# Patient Record
Sex: Female | Born: 1965
Health system: Southern US, Community
[De-identification: ages and names within clinical notes are randomized; demographics above are authoritative.]

## PROBLEM LIST (undated history)

## (undated) DIAGNOSIS — H547 Unspecified visual loss: Secondary | ICD-10-CM

## (undated) DIAGNOSIS — I639 Cerebral infarction, unspecified: Secondary | ICD-10-CM

## (undated) DIAGNOSIS — D171 Benign lipomatous neoplasm of skin and subcutaneous tissue of trunk: Secondary | ICD-10-CM

## (undated) DIAGNOSIS — G43009 Migraine without aura, not intractable, without status migrainosus: Secondary | ICD-10-CM

## (undated) DIAGNOSIS — R011 Cardiac murmur, unspecified: Secondary | ICD-10-CM

## (undated) DIAGNOSIS — G609 Hereditary and idiopathic neuropathy, unspecified: Secondary | ICD-10-CM

## (undated) DIAGNOSIS — L089 Local infection of the skin and subcutaneous tissue, unspecified: Secondary | ICD-10-CM

## (undated) DIAGNOSIS — L03115 Cellulitis of right lower limb: Secondary | ICD-10-CM

## (undated) DIAGNOSIS — G2581 Restless legs syndrome: Secondary | ICD-10-CM

## (undated) DIAGNOSIS — I2510 Atherosclerotic heart disease of native coronary artery without angina pectoris: Secondary | ICD-10-CM

## (undated) DIAGNOSIS — E039 Hypothyroidism, unspecified: Secondary | ICD-10-CM

## (undated) DIAGNOSIS — E11628 Type 2 diabetes mellitus with other skin complications: Secondary | ICD-10-CM

## (undated) DIAGNOSIS — Z87442 Personal history of urinary calculi: Secondary | ICD-10-CM

## (undated) DIAGNOSIS — L02611 Cutaneous abscess of right foot: Secondary | ICD-10-CM

## (undated) DIAGNOSIS — I10 Essential (primary) hypertension: Secondary | ICD-10-CM

## (undated) DIAGNOSIS — K219 Gastro-esophageal reflux disease without esophagitis: Secondary | ICD-10-CM

## (undated) DIAGNOSIS — F329 Major depressive disorder, single episode, unspecified: Secondary | ICD-10-CM

## (undated) DIAGNOSIS — I739 Peripheral vascular disease, unspecified: Secondary | ICD-10-CM

## (undated) DIAGNOSIS — E1165 Type 2 diabetes mellitus with hyperglycemia: Secondary | ICD-10-CM

## (undated) DIAGNOSIS — I96 Gangrene, not elsewhere classified: Secondary | ICD-10-CM

## (undated) DIAGNOSIS — F419 Anxiety disorder, unspecified: Secondary | ICD-10-CM

## (undated) DIAGNOSIS — S98111A Complete traumatic amputation of right great toe, initial encounter: Secondary | ICD-10-CM

## (undated) DIAGNOSIS — E663 Overweight: Secondary | ICD-10-CM

## (undated) DIAGNOSIS — R209 Unspecified disturbances of skin sensation: Secondary | ICD-10-CM

## (undated) DIAGNOSIS — E785 Hyperlipidemia, unspecified: Secondary | ICD-10-CM

## (undated) DIAGNOSIS — M86172 Other acute osteomyelitis, left ankle and foot: Secondary | ICD-10-CM

## (undated) DIAGNOSIS — I5033 Acute on chronic diastolic (congestive) heart failure: Secondary | ICD-10-CM

## (undated) DIAGNOSIS — H269 Unspecified cataract: Secondary | ICD-10-CM

## (undated) HISTORY — DX: Unspecified disturbances of skin sensation: R20.9

## (undated) HISTORY — DX: Cardiac murmur, unspecified: R01.1

## (undated) HISTORY — DX: Cerebral infarction, unspecified: I63.9

## (undated) HISTORY — DX: Hereditary and idiopathic neuropathy, unspecified: G60.9

## (undated) HISTORY — DX: Benign lipomatous neoplasm of skin and subcutaneous tissue of trunk: D17.1

## (undated) HISTORY — DX: Cutaneous abscess of right foot: L02.611

## (undated) HISTORY — DX: Restless legs syndrome: G25.81

## (undated) HISTORY — DX: Overweight: E66.3

## (undated) HISTORY — DX: Local infection of the skin and subcutaneous tissue, unspecified: L08.9

## (undated) HISTORY — DX: Essential (primary) hypertension: I10

## (undated) HISTORY — DX: Peripheral vascular disease, unspecified: I73.9

## (undated) HISTORY — DX: Type 2 diabetes mellitus with hyperglycemia: E11.65

## (undated) HISTORY — DX: Hyperlipidemia, unspecified: E78.5

## (undated) HISTORY — DX: Type 2 diabetes mellitus with other skin complications: E11.628

## (undated) HISTORY — DX: Major depressive disorder, single episode, unspecified: F32.9

## (undated) HISTORY — PX: WISDOM TOOTH EXTRACTION: SHX21

## (undated) HISTORY — DX: Migraine without aura, not intractable, without status migrainosus: G43.009

## (undated) HISTORY — DX: Unspecified visual loss: H54.7

---
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
## 1988-09-08 LAB — CONVERTED CEMR LAB

## 2002-11-05 ENCOUNTER — Encounter: Payer: Self-pay | Admitting: Emergency Medicine

## 2002-11-05 ENCOUNTER — Emergency Department (HOSPITAL_COMMUNITY): Admission: EM | Admit: 2002-11-05 | Discharge: 2002-11-05 | Payer: Self-pay | Admitting: Emergency Medicine

## 2010-10-07 ENCOUNTER — Ambulatory Visit
Admission: RE | Admit: 2010-10-07 | Discharge: 2010-10-07 | Payer: Self-pay | Source: Home / Self Care | Attending: Family Medicine | Admitting: Family Medicine

## 2010-10-07 DIAGNOSIS — G609 Hereditary and idiopathic neuropathy, unspecified: Secondary | ICD-10-CM

## 2010-10-07 DIAGNOSIS — I10 Essential (primary) hypertension: Secondary | ICD-10-CM | POA: Insufficient documentation

## 2010-10-07 DIAGNOSIS — E1165 Type 2 diabetes mellitus with hyperglycemia: Secondary | ICD-10-CM

## 2010-10-07 DIAGNOSIS — IMO0002 Reserved for concepts with insufficient information to code with codable children: Secondary | ICD-10-CM

## 2010-10-07 DIAGNOSIS — R209 Unspecified disturbances of skin sensation: Secondary | ICD-10-CM

## 2010-10-07 DIAGNOSIS — G43009 Migraine without aura, not intractable, without status migrainosus: Secondary | ICD-10-CM

## 2010-10-07 DIAGNOSIS — E119 Type 2 diabetes mellitus without complications: Secondary | ICD-10-CM | POA: Insufficient documentation

## 2010-10-07 DIAGNOSIS — Z8679 Personal history of other diseases of the circulatory system: Secondary | ICD-10-CM | POA: Insufficient documentation

## 2010-10-07 DIAGNOSIS — Z87442 Personal history of urinary calculi: Secondary | ICD-10-CM | POA: Insufficient documentation

## 2010-10-07 HISTORY — DX: Migraine without aura, not intractable, without status migrainosus: G43.009

## 2010-10-07 HISTORY — DX: Type 2 diabetes mellitus with hyperglycemia: E11.65

## 2010-10-07 HISTORY — DX: Reserved for concepts with insufficient information to code with codable children: IMO0002

## 2010-10-07 HISTORY — DX: Unspecified disturbances of skin sensation: R20.9

## 2010-10-07 HISTORY — DX: Hereditary and idiopathic neuropathy, unspecified: G60.9

## 2010-10-16 ENCOUNTER — Encounter: Payer: Self-pay | Admitting: *Deleted

## 2010-10-16 NOTE — Assessment & Plan Note (Signed)
Summary: New pt est care/dt [employer] rsch per bmp list/dt   Vital Signs:  Patient profile:   45 year old female Menstrual status:  irregular Height:      62 inches (157.48 cm) Weight:      150 pounds (68.18 kg) BMI:     27.53 O2 Sat:      99 % on Room air Temp:     97.8 degrees F (36.56 degrees C) oral Pulse rate:   83 / minute BP sitting:   167 / 98  (right arm) Cuff size:   regular  Vitals Entered By: Josph Macho RMA (October 07, 2010 8:52 AM)  O2 Flow:  Room air CC: Establish new patient/ CF     Menstrual Status irregular Last PAP Result historical   History of Present Illness: Patient is a 45yo Caucasian female who was seen after working a shift all night long. She is here to establish care. he reports working the night shift for quite some time in roughly getting 4-5 hours sleep anything for her. Usually broken up. As a result she struggles with chronic fatigue. She has a history of migraines although those are actually less frequent at the moment and he has been in the past. She has been told in the past she has a cardiac murmur. As well as empty sellar syndrome diagnosed by CAT scan many years ago. At present her complaints or neurological she is complaining of a sensation of numbness and decreased sensation over her right ear and entire right side of her scalp. As well as slightly on the neck and shoulder she says it's been going on for several months now, maybe as long as 6. complaining of tingling in almost a burning sensation that's constant but does very in intensity in bilateral feet. She reports encompasses that but if he completely and extends up to just below the mid tibial plateau bilaterally. She reports having trouble with diabetes in the past but always diet-controlled and never had any trouble with severe high sugars. She denies polyuria, polydipsia, fevers, chills, chest pain palpitations, shortness of breath, GI or GU complaints at this time.  Preventive  Screening-Counseling & Management  Alcohol-Tobacco     Smoking Status: quit  Caffeine-Diet-Exercise     Does Patient Exercise: yes      Drug Use:  no.    Current Problems (verified): 1)  Disturbance of Skin Sensation  (ICD-782.0) 2)  Common Migraine  (ICD-346.10) 3)  Nephrolithiasis, Hx of  (ICD-V13.01) 4)  Heart Murmur, Hx of  (ICD-V12.50) 5)  Peripheral Neuropathy, Feet  (ICD-356.9) 6)  Essential Hypertension, Benign  (ICD-401.1) 7)  Dm  (ICD-250.00)  Current Medications (verified): 1)  None  Allergies (verified): No Known Drug Allergies  Past History:  Family History: Last updated: 10/07/2010 Father: 51, A&W Mother: 19, arthritis, osteo Siblings:  Brother: 9, stroke, previous smoker Brother: 52, Leukemia, waiting liver transplant, alcoholism, in remission MGM: deceased older, unknown causes MGF: deceased older, unknown causes PGM: deceased older, unknown causes PGF: deceased older, unknown causes Children: Son: 19, A&W  Social History: Last updated: 10/07/2010 Occupation: Location manager, night shift Former Games developer, 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 Occupation:  employed Smoking Status:  quit Drug Use:  no Does Patient Exercise:  yes  Review of Systems  The patient denies anorexia, fever, weight loss, weight gain, vision loss, decreased hearing, hoarseness, chest pain, syncope, dyspnea on exertion, peripheral edema, prolonged cough, headaches, hemoptysis, abdominal pain, melena, hematochezia, severe indigestion/heartburn, hematuria, incontinence, muscle weakness, suspicious skin lesions, transient blindness, difficulty walking, depression, unusual weight change, abnormal bleeding, and enlarged lymph nodes.    Physical Exam  General:  Well-developed,well-nourished,in no acute distress; alert,appropriate and cooperative throughout examination Head:  Normocephalic and atraumatic without obvious abnormalities. No apparent alopecia or balding. Eyes:  No corneal or conjunctival inflammation noted. EOMI. Perrla.  Ears:  External ear exam shows no significant lesions or deformities.  Otoscopic examination reveals clear canals, tympanic membranes are intact bilaterally without bulging, retraction, inflammation or discharge. Hearing is grossly normal bilaterally. Nose:  External nasal examination shows no deformity or inflammation. Nasal mucosa are pink and moist without lesions or exudates. Mouth:  Oral mucosa and oropharynx without lesions or exudates.  Teeth in good repair. Neck:  No deformities, masses, or tenderness noted. Lungs:  Normal respiratory effort, chest expands symmetrically. Lungs are clear to auscultation, no crackles or wheezes. Heart:  Normal rate and regular rhythm. S1 and S2 normal without gallop, murmur, click, rub or other extra sounds. Abdomen:  Bowel sounds positive,abdomen soft and non-tender without masses, organomegaly or hernias  noted. Msk:  No deformity or scoliosis noted of thoracic or lumbar spine.   Pulses:  R and L carotid,dorsalis pedis and posterior tibial pulses are full and equal bilaterally Extremities:  No clubbing, cyanosis, edema, or deformity noted with normal full range of motion of all joints.   Neurologic:   Station and gait are normal. Plantar reflexes are down-going bilaterally. DTRs are symmetrical throughout. mildly decreased light touch throughout right face compared to left face. Patient reports decreased sensation along lower ribs on right as well. Then decreased sensation from midtibial plateau down to toes b/l Skin:  Intact without suspicious lesions or rashes Cervical Nodes:  No lymphadenopathy noted Psych:  Cognition and judgment appear intact. Alert and cooperative with normal attention span and concentration. No apparent delusions, illusions, hallucinations   Impression & Recommendations:  Problem # 1:  DISTURBANCE OF SKIN SENSATION (ICD-782.0) Unclear etiology will check basic labs including Vitamin B studies, electrolytes, TSH, ESR to further evaluate and patient asked to start a vitamin B complex daily, may need neurologic referral if all studies neg  Problem # 2:  ESSENTIAL HYPERTENSION, BENIGN (ICD-401.1)  Her updated medication list for this problem includes:    Metoprolol Succinate 25 Mg Xr24h-tab (Metoprolol succinate) .Marland Kitchen... 1 tab by mouth daily BP trending up in recent past will initiate meds, patient asked to minimize sodium and caffeine and we will reassess at next visit. Patient only getting max of 5 hours sleep daily, usually broken up because she works nights. Encouraged to try to consolidate her sleep whenever possible  Problem #  3:  COMMON MIGRAINE (ICD-346.10)  Her updated medication list for this problem includes:    Metoprolol Succinate 25 Mg Xr24h-tab (Metoprolol succinate) .Marland Kitchen... 1 tab by mouth daily Very infrequent now, patient will report worsening  symptoms  Complete Medication List: 1)  Metoprolol Succinate 25 Mg Xr24h-tab (Metoprolol succinate) .Marland Kitchen.. 1 tab by mouth daily  Patient Instructions: 1)  Please schedule a follow-up appointment in 2 to 4 weeks. Labs here when we open lab 2)  BMP prior to visit, ICD-9:  3)  Hepatic Panel prior to visit ICD-9:  4)  Lipid panel prior to visit ICD-9 :  5)  TSH prior to visit ICD-9 :  6)  CBC w/ Diff prior to visit ICD-9 :  7)  HgBA1c prior to visit  ICD-9:  8)  Urine Microalbumin prior to visit ICD-9 :  9)  ferritin 10)  Vitamin B12 level 11)  Thiamine and Folic Acid levels 12)  Release of Records [facility] 13)  Start a Vitamin B complex qd Prescriptions: METOPROLOL SUCCINATE 25 MG XR24H-TAB (METOPROLOL SUCCINATE) 1 tab by mouth daily  #30 x 2   Entered and Authorized by:   Danise Edge MD   Signed by:   Danise Edge MD on 10/07/2010   Method used:   Electronically to        [facility]  [address] [phone][facility] (retail)       [address] [place], [place]  [address]       Ph: [phone] or [phone]       Fax: [phone]   RxID:   [id number]    Orders Added: 1)  New Patient Level III [29518]   Immunization History:  Influenza Immunization History:    Influenza:  historical (05/09/2010)   Immunization History:  Influenza Immunization History:    Influenza:  Historical (05/09/2010)  Preventive Care Screening  Last Tetanus Booster:    Date:  09/08/2008    Results:  Historical   Pap Smear:    Date:  09/08/1988    Results:  historical

## 2010-10-25 ENCOUNTER — Encounter: Payer: Self-pay | Admitting: Family Medicine

## 2010-10-25 ENCOUNTER — Ambulatory Visit (INDEPENDENT_AMBULATORY_CARE_PROVIDER_SITE_OTHER): Payer: Self-pay | Admitting: Family Medicine

## 2010-10-25 DIAGNOSIS — G2581 Restless legs syndrome: Secondary | ICD-10-CM

## 2010-10-25 DIAGNOSIS — G609 Hereditary and idiopathic neuropathy, unspecified: Secondary | ICD-10-CM

## 2010-10-25 DIAGNOSIS — I10 Essential (primary) hypertension: Secondary | ICD-10-CM

## 2010-10-25 DIAGNOSIS — R209 Unspecified disturbances of skin sensation: Secondary | ICD-10-CM

## 2010-10-25 HISTORY — DX: Restless legs syndrome: G25.81

## 2010-10-30 ENCOUNTER — Encounter: Payer: Self-pay | Admitting: *Deleted

## 2010-10-31 ENCOUNTER — Other Ambulatory Visit: Payer: BC Managed Care – PPO

## 2010-10-31 ENCOUNTER — Other Ambulatory Visit: Payer: Self-pay | Admitting: Family Medicine

## 2010-10-31 ENCOUNTER — Encounter: Payer: Self-pay | Admitting: Family Medicine

## 2010-10-31 LAB — CBC WITH DIFFERENTIAL/PLATELET
Basophils Absolute: 0 10*3/uL (ref 0.0–0.1)
Basophils Relative: 0.6 % (ref 0.0–3.0)
Eosinophils Absolute: 0.1 10*3/uL (ref 0.0–0.7)
Eosinophils Relative: 1.5 % (ref 0.0–5.0)
HCT: 38.4 % (ref 36.0–46.0)
Hemoglobin: 13.2 g/dL (ref 12.0–15.0)
Lymphocytes Relative: 25.3 % (ref 12.0–46.0)
Lymphs Abs: 1.7 10*3/uL (ref 0.7–4.0)
MCHC: 34.3 g/dL (ref 30.0–36.0)
MCV: 86.7 fl (ref 78.0–100.0)
Monocytes Absolute: 0.5 10*3/uL (ref 0.1–1.0)
Monocytes Relative: 7.7 % (ref 3.0–12.0)
Neutro Abs: 4.5 10*3/uL (ref 1.4–7.7)
Neutrophils Relative %: 64.9 % (ref 43.0–77.0)
Platelets: 303 10*3/uL (ref 150.0–400.0)
RBC: 4.43 Mil/uL (ref 3.87–5.11)
RDW: 13.4 % (ref 11.5–14.6)
WBC: 6.9 10*3/uL (ref 4.5–10.5)

## 2010-10-31 LAB — B12 AND FOLATE PANEL
Folate: 16 ng/mL (ref >5.9)
Vitamin B-12: 379 pg/mL (ref 211–911)

## 2010-10-31 LAB — LIPID PANEL
Cholesterol: 194 mg/dL (ref 0–200)
HDL: 46.8 mg/dL (ref >39.00)
LDL Cholesterol: 121 mg/dL — ABNORMAL HIGH (ref 0–99)
Total CHOL/HDL Ratio: 4
Triglycerides: 132 mg/dL (ref 0.0–149.0)
VLDL: 26.4 mg/dL (ref 0.0–40.0)

## 2010-10-31 LAB — HEPATIC FUNCTION PANEL
ALT: 20 U/L (ref 0–35)
AST: 18 U/L (ref 0–37)
Albumin: 3.5 g/dL (ref 3.5–5.2)
Alkaline Phosphatase: 97 U/L (ref 39–117)
Bilirubin, Direct: 0.1 mg/dL (ref 0.0–0.3)
Total Bilirubin: 0.2 mg/dL — ABNORMAL LOW (ref 0.3–1.2)
Total Protein: 6.7 g/dL (ref 6.0–8.3)

## 2010-10-31 LAB — SEDIMENTATION RATE: Sed Rate: 19 mm/hr (ref 0–22)

## 2010-10-31 LAB — TSH: TSH: 2.13 u[IU]/mL (ref 0.35–5.50)

## 2010-10-31 LAB — HIGH SENSITIVITY CRP: CRP, High Sensitivity: 5.23 mg/L — ABNORMAL HIGH (ref 0.00–5.00)

## 2010-10-31 LAB — MAGNESIUM: Magnesium: 1.7 mg/dL (ref 1.5–2.5)

## 2010-10-31 LAB — FERRITIN: Ferritin: 17.8 ng/mL (ref 10.0–291.0)

## 2010-11-05 NOTE — Assessment & Plan Note (Signed)
Summary: 2 week follow up appt   Vital Signs:  Patient profile:   45 year old female Menstrual status:  irregular Height:      62 inches (157.48 cm) Weight:      151.25 pounds (68.75 kg) O2 Sat:      99 % on Room air Temp:     98.2 degrees F (36.78 degrees C) oral Pulse rate:   71 / minute BP sitting:   143 / 91  (right arm) Cuff size:   regular  Vitals Entered By: Josph Macho RMA (October 25, 2010 8:30 AM)  O2 Flow:  Room air  Serial Vital Signs/Assessments:  Time      Position  BP       Pulse  Resp  Temp     By                     140/88                         Danise Edge MD  CC: 2 week follow up/ CF Is Patient Diabetic? No   History of Present Illness: Patient is a 45 yo Caucasian femalre who is in today for follow up on her new patient appt. She notes  the discomfort and numbness in her feet is really bothering her, at night they actually hurt and tingle and she has a hard time getting comfortable because of it. She will use Aleve, Advil or Tylenol at times and get some relief from any of them. No fevers, chills, CP, palp, SOB, GI or GU c/o. Still having trouble sleeping both going to sleep and staying asleep.  Current Medications (verified): 1)  Metoprolol Succinate 25 Mg Xr24h-Tab (Metoprolol Succinate) .Marland Kitchen.. 1 Tab By Mouth Daily  Allergies (verified): No Known Drug Allergies  Past History:  Past medical history reviewed for relevance to current acute and chronic problems. Social history (including risk factors) reviewed for relevance to current acute and chronic problems.  Social History: Reviewed history from 10/07/2010 and no changes required. 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  Review of Systems      See  HPI  Physical Exam  General:  Well-developed,well-nourished,in no acute distress; alert,appropriate and cooperative throughout examination Head:  Normocephalic and atraumatic without obvious abnormalities. No apparent alopecia or balding. Mouth:  Oral mucosa and oropharynx without lesions or exudates.  Teeth in good repair. Neck:  No deformities, masses, or tenderness noted. Lungs:  Normal respiratory effort, chest expands symmetrically. Lungs are clear to auscultation, no crackles or wheezes. Heart:  Normal rate and regular rhythm. S1 and S2 normal without gallop, murmur, click, rub or other extra sounds. Abdomen:  Bowel sounds positive,abdomen soft and non-tender without masses, organomegaly or hernias noted. Extremities:  No clubbing, cyanosis, edema, or deformity noted with normal full range of motion of all joints.   Cervical Nodes:  No lymphadenopathy noted Psych:  Cognition and judgment appear intact. Alert and cooperative with normal attention span and concentration. No apparent delusions, illusions, hallucinations   Impression & Recommendations:  Problem # 1:  DISTURBANCE OF SKIN SENSATION (ICD-782.0)  Orders: TLB-TSH (Thyroid Stimulating Hormone) (84443-TSH) TLB-CRP-High Sensitivity (C-Reactive Protein) (86140-FCRP) TLB-Ferritin (82728-FER) TLB-Magnesium (Mg) (83735-MG) TLB-Sedimentation Rate (ESR) (85652-ESR) T-Rocky Mtn Spotted  Fever AB IgG IgM (66440-34742) Strange disstribution of numbness and paresthesias, along right side of head, ear, CW and b/l feet. Symptoms have been present and stable for about a year now. She does recal some tick bites with rash but only a few months ago, will check labs and refer to Neuro for further evaluation  Problem # 2:  ESSENTIAL HYPERTENSION, BENIGN (ICD-401.1)  Her updated medication list for this problem includes:    Metoprolol Succinate 25 Mg Xr24h-tab (Metoprolol succinate) .Marland Kitchen... 1 tab by mouth daily  Orders: TLB-Lipid Panel  (80061-LIPID) TLB-CBC Platelet - w/Differential (85025-CBCD) TLB-Hepatic/Liver Function Pnl (80076-HEPATIC) TLB-TSH (Thyroid Stimulating Hormone) (84443-TSH) TLB-CRP-High Sensitivity (C-Reactive Protein) (86140-FCRP) TLB-Ferritin (82728-FER) TLB-Magnesium (Mg) (83735-MG) TLB-Sedimentation Rate (ESR) (85652-ESR) Avoid sodium and recheck at next visit  Problem # 3:  RESTLESS LEG SYNDROME (ICD-333.94) Started on Requip and will reevluate at next visit  Complete Medication List: 1)  Metoprolol Succinate 25 Mg Xr24h-tab (Metoprolol succinate) .Marland Kitchen.. 1 tab by mouth daily 2)  Requip 0.5 Mg Tabs (Ropinirole hcl) .Marland Kitchen.. 1-2 tabs by mouth once at bedtime  Other Orders: TLB-B12 + Folate Pnl (82746_82607-B12/FOL) T-Lyme Disease (59563-87564)  Patient Instructions: 1)  Please schedule a follow-up appointment in 1 to 2 months 2)  Minimize sodium, take med prior to visit 3)  take requip one tab at bedtime x 1 week and then increase to 2 tabs at bedtime as needed as tolerated Prescriptions: REQUIP 0.5 MG TABS (ROPINIROLE HCL) 1-2 tabs by mouth once at bedtime  #60 x 1   Entered and Authorized by:   Danise Edge MD   Signed by:   Danise Edge MD on 10/25/2010   Method used:   [facility] to        [facility]  [address] [phone]* (retail)       [address] [place], [place]  [address]       Ph: [phone] or [phone]       Fax: [phone]   RxID:   [id number]    Orders Added: 1)  TLB-B12 + Folate Pnl [82746_82607-B12/FOL] 2)  TLB-Lipid Panel [80061-LIPID] 3)  TLB-CBC Platelet - w/Differential [85025-CBCD] 4)  TLB-Hepatic/Liver Function Pnl [80076-HEPATIC] 5)  TLB-TSH (Thyroid Stimulating Hormone) [84443-TSH] 6)  TLB-CRP-High Sensitivity (C-Reactive Protein) [86140-FCRP] 7)  TLB-Ferritin [82728-FER] 8)  TLB-Magnesium (Mg) [83735-MG] 9)  TLB-Sedimentation Rate (ESR) [85652-ESR] 10)  T-Lyme Disease [37106-26948] 11)  T-Rocky Mtn Spotted Fever AB IgG IgM  [54627-03500] 12)  Est. Patient Level IV [93818]

## 2010-11-18 ENCOUNTER — Encounter: Payer: Self-pay | Admitting: *Deleted

## 2010-12-06 ENCOUNTER — Ambulatory Visit (INDEPENDENT_AMBULATORY_CARE_PROVIDER_SITE_OTHER): Payer: BC Managed Care – PPO | Admitting: Family Medicine

## 2010-12-06 ENCOUNTER — Encounter: Payer: Self-pay | Admitting: Family Medicine

## 2010-12-06 DIAGNOSIS — I10 Essential (primary) hypertension: Secondary | ICD-10-CM

## 2010-12-06 DIAGNOSIS — E663 Overweight: Secondary | ICD-10-CM

## 2010-12-06 DIAGNOSIS — G609 Hereditary and idiopathic neuropathy, unspecified: Secondary | ICD-10-CM

## 2010-12-06 DIAGNOSIS — G2581 Restless legs syndrome: Secondary | ICD-10-CM

## 2010-12-06 DIAGNOSIS — E119 Type 2 diabetes mellitus without complications: Secondary | ICD-10-CM

## 2010-12-06 DIAGNOSIS — E785 Hyperlipidemia, unspecified: Secondary | ICD-10-CM

## 2010-12-06 DIAGNOSIS — R202 Paresthesia of skin: Secondary | ICD-10-CM

## 2010-12-06 DIAGNOSIS — R209 Unspecified disturbances of skin sensation: Secondary | ICD-10-CM

## 2010-12-06 DIAGNOSIS — Z87442 Personal history of urinary calculi: Secondary | ICD-10-CM

## 2010-12-06 HISTORY — DX: Hyperlipidemia, unspecified: E78.5

## 2010-12-06 HISTORY — DX: Overweight: E66.3

## 2010-12-06 MED ORDER — DOXYCYCLINE HYCLATE 100 MG PO TABS: 100.0000 mg | ORAL_TABLET | Freq: Two times a day (BID) | ORAL | Status: AC

## 2010-12-06 MED ORDER — CHLORTHALIDONE 25 MG PO TABS: 25.0000 mg | ORAL_TABLET | Freq: Every day | ORAL | Status: DC

## 2010-12-06 NOTE — Assessment & Plan Note (Signed)
No recent episodes, encouraged ongoing adequate hydration

## 2010-12-06 NOTE — Assessment & Plan Note (Signed)
Numbers remain hi will add Chlorthalidone and recheck BP and renal panel in one month or as needed.

## 2010-12-06 NOTE — Progress Notes (Deleted)
[name] 12/06/2010      Progress Note-Follow Up  Subjective Subjective  Past Medical History  Diagnosis Date  . Hypertension   . Hyperlipidemia 12/06/2010  . Overweight 12/06/2010    History reviewed. No pertinent past surgical history.  Family History  Problem Relation Age of Onset  . Arthritis Mother   . Stroke Brother     previous smoker  . Alcohol abuse Brother     in remission  . Leukemia Brother   . Diabetes Paternal Grandmother     History   Social History  . Marital Status: Married    Spouse Name: N/A    Number of Children: N/A  . Years of Education: N/A   Occupational History  . Not on file.   Social History Main Topics  . Smoking status: Former Smoker -- 0.5 packs/day    Quit date: 09/08/2005  . Smokeless tobacco: Never Used  . Alcohol Use: No     occasional  . Drug Use: No  . Sexually Active: Yes -- Female partner(s)   Other Topics Concern  . Not on file   Social History Narrative  . No narrative on file    Current Outpatient Prescriptions on File Prior to Visit  Medication Sig Dispense Refill  . metoprolol (TOPROL-XL) 25 MG 24 hr tablet Take 25 mg by mouth daily.        Marland Kitchen rOPINIRole (REQUIP) 0.5 MG tablet Take 0.5 mg by mouth at bedtime. 1-2 tabs         No Known Allergies  ROS .ros  Objective Objective  BP 157/94  Pulse 77  Temp(Src) 97.8 F (36.6 C) (Oral)  Ht 5\' 2"  (1.575 m)  Wt 151 lb (68.493 kg)  BMI 27.62 kg/m2  SpO2 98%  LMP 10/23/2010  Lab Results  Component Value Date   TSH 2.13 10/31/2010   Lab Results  Component Value Date   WBC 6.9 10/31/2010   HGB 13.2 10/31/2010   HCT 38.4 10/31/2010   MCV 86.7 10/31/2010   PLT 303.0 10/31/2010   No results found for this basename: CREATININE, BUN, NA, K, CL, CO2   Lab Results  Component Value Date   ALT 20 10/31/2010   AST 18 10/31/2010   ALKPHOS 97 10/31/2010   BILITOT 0.2* 10/31/2010   Lab Results  Component Value Date   CHOL 194 10/31/2010   Lab Results    Component Value Date   HDL 46.80 10/31/2010   Lab Results  Component Value Date   LDLCALC 121* 10/31/2010   Lab Results  Component Value Date   TRIG 132.0 10/31/2010   Lab Results  Component Value Date   CHOLHDL 4 10/31/2010     Assessment & Plan  RESTLESS LEG SYNDROME She has found Requip helpful but uses it infrequently because she wakes up groggy the next morning and she is worried about habituating. She can use a half a tab if she finds that helpful and continuue to use the meds prn.   PERIPHERAL NEUROPATHY, FEET Patient continues to struggling with tingling and burning in her feet and is finding it increasingly uncomfortable. She also continues to have paresthesias on the right side of her neck and head. She will be referred to neurology at this time for further evaluation. She is notified that her labs were largely unremarkable and she was given a copy of them. Her RMSF IGG did come back equivocal and she is notified this is likely a false positive. No changes to therapy  at this time.   ESSENTIAL HYPERTENSION, BENIGN Numbers remain hi will add Chlorthalidone and recheck BP and renal panel in one month or as needed.  DM By patient report previously diet controlled, will need to check hgba1c at next visit. Avoid simple carbs  Overweight Patient is given a handout on the DASH diet and asked to consider trying it. She is encouraged to increase exercise as tolerated.  Hyperlipidemia Mild elevation in LDL cholesterol. No medications at this time but she is encouraged to avoid trans fats and eat a low fat, hi fiber diet

## 2010-12-06 NOTE — Progress Notes (Deleted)
[name] 12/06/2010      Progress Note-Follow Up  Subjective Subjective  Past Medical History  Diagnosis Date  . Hypertension   . Hyperlipidemia 12/06/2010  . Overweight 12/06/2010    History reviewed. No pertinent past surgical history.  Family History  Problem Relation Age of Onset  . Arthritis Mother   . Stroke Brother     previous smoker  . Alcohol abuse Brother     in remission  . Leukemia Brother   . Diabetes Paternal Grandmother     History   Social History  . Marital Status: Married    Spouse Name: N/A    Number of Children: N/A  . Years of Education: N/A   Occupational History  . Not on file.   Social History Main Topics  . Smoking status: Former Smoker -- 0.5 packs/day    Quit date: 09/08/2005  . Smokeless tobacco: Never Used  . Alcohol Use: No     occasional  . Drug Use: No  . Sexually Active: Yes -- Female partner(s)   Other Topics Concern  . Not on file   Social History Narrative  . No narrative on file    Current Outpatient Prescriptions on File Prior to Visit  Medication Sig Dispense Refill  . metoprolol (TOPROL-XL) 25 MG 24 hr tablet Take 25 mg by mouth daily.        Marland Kitchen rOPINIRole (REQUIP) 0.5 MG tablet Take 0.5 mg by mouth at bedtime. 1-2 tabs         No Known Allergies  ROS Objective Objective  BP 157/94  Pulse 77  Temp(Src) 97.8 F (36.6 C) (Oral)  Ht 5\' 2"  (1.575 m)  Wt 151 lb (68.493 kg)  BMI 27.62 kg/m2  SpO2 98%  LMP 10/23/2010  Lab Results  Component Value Date   TSH 2.13 10/31/2010   Lab Results  Component Value Date   WBC 6.9 10/31/2010   HGB 13.2 10/31/2010   HCT 38.4 10/31/2010   MCV 86.7 10/31/2010   PLT 303.0 10/31/2010   No results found for this basename: CREATININE, BUN, NA, K, CL, CO2   Lab Results  Component Value Date   ALT 20 10/31/2010   AST 18 10/31/2010   ALKPHOS 97 10/31/2010   BILITOT 0.2* 10/31/2010   Lab Results  Component Value Date   CHOL 194 10/31/2010   Lab Results  Component  Value Date   HDL 46.80 10/31/2010   Lab Results  Component Value Date   LDLCALC 121* 10/31/2010   Lab Results  Component Value Date   TRIG 132.0 10/31/2010   Lab Results  Component Value Date   CHOLHDL 4 10/31/2010     Assessment & Plan  RESTLESS LEG SYNDROME She has found Requip helpful but uses it infrequently because she wakes up groggy the next morning and she is worried about habituating. She can use a half a tab if she finds that helpful and continuue to use the meds prn.   PERIPHERAL NEUROPATHY, FEET Patient continues to struggling with tingling and burning in her feet and is finding it increasingly uncomfortable. She also continues to have paresthesias on the right side of her neck and head. She will be referred to neurology at this time for further evaluation. She is notified that her labs were largely unremarkable and she was given a copy of them. Her RMSF IGG did come back equivocal and she is notified this is likely a false positive. No changes to therapy at this time.  ESSENTIAL HYPERTENSION, BENIGN Numbers remain hi will add Chlorthalidone and recheck BP and renal panel in one month or as needed.  DM By patient report previously diet controlled, will need to check hgba1c at next visit. Avoid simple carbs  Overweight Patient is given a handout on the DASH diet and asked to consider trying it. She is encouraged to increase exercise as tolerated.  Hyperlipidemia Mild elevation in LDL cholesterol. No medications at this time but she is encouraged to avoid trans fats and eat a low fat, hi fiber diet

## 2010-12-06 NOTE — Assessment & Plan Note (Signed)
Mild elevation in LDL cholesterol. No medications at this time but she is encouraged to avoid trans fats and eat a low fat, hi fiber diet

## 2010-12-06 NOTE — Assessment & Plan Note (Signed)
Patient is given a handout on the DASH diet and asked to consider trying it. She is encouraged to increase exercise as tolerated.

## 2010-12-06 NOTE — Progress Notes (Deleted)
Danniel Grenz [id number] 1966/03/05 12/06/2010      Progress Note-Follow Up  Subjective  Chief Complaint  Chief Complaint  Patient presents with  . Follow-up    on labs    HPI  ***  Past Medical History  Diagnosis Date  . Hypertension   . Hyperlipidemia 12/06/2010  . Overweight 12/06/2010    History reviewed. No pertinent past surgical history.  Family History  Problem Relation Age of Onset  . Arthritis Mother   . Stroke Brother     previous smoker  . Alcohol abuse Brother     in remission  . Leukemia Brother   . Diabetes Paternal Grandmother     History   Social History  . Marital Status: Married    Spouse Name: N/A    Number of Children: N/A  . Years of Education: N/A   Occupational History  . Not on file.   Social History Main Topics  . Smoking status: Former Smoker -- 0.5 packs/day    Quit date: 09/08/2005  . Smokeless tobacco: Never Used  . Alcohol Use: No     occasional  . Drug Use: No  . Sexually Active: Yes -- Female partner(s)   Other Topics Concern  . Not on file   Social History Narrative  . No narrative on file    Current Outpatient Prescriptions on File Prior to Visit  Medication Sig Dispense Refill  . metoprolol (TOPROL-XL) 25 MG 24 hr tablet Take 25 mg by mouth daily.        Marland Kitchen rOPINIRole (REQUIP) 0.5 MG tablet Take 0.5 mg by mouth at bedtime. 1-2 tabs         No Known Allergies  Review of Systems  Review of Systems  Constitutional: Negative for fever and malaise/fatigue.  HENT: Negative for congestion.   Eyes: Negative for discharge.  Respiratory: Negative for shortness of breath.   Cardiovascular: Negative for chest pain, palpitations and leg swelling.  Gastrointestinal: Negative for nausea, abdominal pain and diarrhea.  Genitourinary: Negative for dysuria.  Musculoskeletal: Negative for falls.  Skin: Negative for rash.  Neurological: Negative for loss of consciousness and headaches.  Endo/Heme/Allergies: Negative for  polydipsia.  Psychiatric/Behavioral: Negative for depression and suicidal ideas. The patient is not nervous/anxious and does not have insomnia.     Objective  BP 157/94  Pulse 77  Temp(Src) 97.8 F (36.6 C) (Oral)  Ht 5\' 2"  (1.575 m)  Wt 151 lb (68.493 kg)  BMI 27.62 kg/m2  SpO2 98%  LMP 10/23/2010  Physical Exam  Physical Exam  Constitutional: She is oriented to person, place, and time and well-developed, well-nourished, and in no distress. No distress.  HENT:  Head: Normocephalic and atraumatic.  Eyes: Conjunctivae are normal.  Neck: Neck supple. No thyromegaly present.  Cardiovascular: Normal rate, regular rhythm and normal heart sounds.   No murmur heard. Pulmonary/Chest: Effort normal and breath sounds normal. She has no wheezes.  Abdominal: She exhibits no distension and no mass.  Musculoskeletal: She exhibits no edema.  Lymphadenopathy:    She has no cervical adenopathy.  Neurological: She is alert and oriented to person, place, and time.  Skin: Skin is warm and dry. No rash noted. She is not diaphoretic.  Psychiatric: Memory, affect and judgment normal.    Lab Results  Component Value Date   TSH 2.13 10/31/2010   Lab Results  Component Value Date   WBC 6.9 10/31/2010   HGB 13.2 10/31/2010   HCT 38.4 10/31/2010   MCV 86.7  10/31/2010   PLT 303.0 10/31/2010   No results found for this basename: CREATININE, BUN, NA, K, CL, CO2   Lab Results  Component Value Date   ALT 20 10/31/2010   AST 18 10/31/2010   ALKPHOS 97 10/31/2010   BILITOT 0.2* 10/31/2010   Lab Results  Component Value Date   CHOL 194 10/31/2010   Lab Results  Component Value Date   HDL 46.80 10/31/2010   Lab Results  Component Value Date   LDLCALC 121* 10/31/2010   Lab Results  Component Value Date   TRIG 132.0 10/31/2010   Lab Results  Component Value Date   CHOLHDL 4 10/31/2010     Assessment & Plan  RESTLESS LEG SYNDROME She has found Requip helpful but uses it infrequently  because she wakes up groggy the next morning and she is worried about habituating. She can use a half a tab if she finds that helpful and continuue to use the meds prn.   PERIPHERAL NEUROPATHY, FEET Patient continues to struggling with tingling and burning in her feet and is finding it increasingly uncomfortable. She also continues to have paresthesias on the right side of her neck and head. She will be referred to neurology at this time for further evaluation. She is notified that her labs were largely unremarkable and she was given a copy of them. Her RMSF IGG did come back equivocal and she is notified this is likely a false positive. No changes to therapy at this time.   ESSENTIAL HYPERTENSION, BENIGN Numbers remain hi will add Chlorthalidone and recheck BP and renal panel in one month or as needed.  DM By patient report previously diet controlled, will need to check hgba1c at next visit. Avoid simple carbs  Overweight Patient is given a handout on the DASH diet and asked to consider trying it. She is encouraged to increase exercise as tolerated.  Hyperlipidemia Mild elevation in LDL cholesterol. No medications at this time but she is encouraged to avoid trans fats and eat a low fat, hi fiber diet

## 2010-12-06 NOTE — Progress Notes (Deleted)
[name] 12/06/2010      Progress Note-Follow Up  Subjective Subjective  Past Medical History  Diagnosis Date  . Hypertension   . Hyperlipidemia 12/06/2010  . Overweight 12/06/2010    History reviewed. No pertinent past surgical history.  Family History  Problem Relation Age of Onset  . Arthritis Mother   . Stroke Brother     previous smoker  . Alcohol abuse Brother     in remission  . Leukemia Brother   . Diabetes Paternal Grandmother     History   Social History  . Marital Status: Married    Spouse Name: N/A    Number of Children: N/A  . Years of Education: N/A   Occupational History  . Not on file.   Social History Main Topics  . Smoking status: Former Smoker -- 0.5 packs/day    Quit date: 09/08/2005  . Smokeless tobacco: Never Used  . Alcohol Use: No     occasional  . Drug Use: No  . Sexually Active: Yes -- Female partner(s)   Other Topics Concern  . Not on file   Social History Narrative  . No narrative on file    Current Outpatient Prescriptions on File Prior to Visit  Medication Sig Dispense Refill  . metoprolol (TOPROL-XL) 25 MG 24 hr tablet Take 25 mg by mouth daily.        Marland Kitchen rOPINIRole (REQUIP) 0.5 MG tablet Take 0.5 mg by mouth at bedtime. 1-2 tabs         No Known Allergies  Review of Systems  Constitutional: Negative for fever and malaise/fatigue.  HENT: Negative for congestion.   Eyes: Negative for discharge.  Respiratory: Negative for shortness of breath.   Cardiovascular: Negative for chest pain, palpitations and leg swelling.  Gastrointestinal: Negative for nausea, abdominal pain and diarrhea.  Genitourinary: Negative for dysuria.  Musculoskeletal: Negative for falls.  Skin: Negative for rash.  Neurological: Positive for tingling. Negative for loss of consciousness and headaches.       Patient with persistent tingling and decreased sensation on right side of neck and scalp and worsening paresthesias in feet with difficulty  sleeping as a result  Endo/Heme/Allergies: Negative for polydipsia.  Psychiatric/Behavioral: Negative for depression and suicidal ideas. The patient is not nervous/anxious and does not have insomnia.     Objective Objective  BP 157/94  Pulse 77  Temp(Src) 97.8 F (36.6 C) (Oral)  Ht 5\' 2"  (1.575 m)  Wt 151 lb (68.493 kg)  BMI 27.62 kg/m2  SpO2 98%  LMP 10/23/2010  Physical Exam  Constitutional: She is oriented to person, place, and time and well-developed, well-nourished, and in no distress. No distress.  HENT:  Head: Normocephalic and atraumatic.  Eyes: Conjunctivae are normal.  Neck: Neck supple. No thyromegaly present.  Cardiovascular: Normal rate, regular rhythm and normal heart sounds.   No murmur heard. Pulmonary/Chest: Effort normal and breath sounds normal. She has no wheezes.  Abdominal: She exhibits no distension and no mass.  Musculoskeletal: She exhibits no edema.  Lymphadenopathy:    She has no cervical adenopathy.  Neurological: She is alert and oriented to person, place, and time.  Skin: Skin is warm and dry. No rash noted. She is not diaphoretic.  Psychiatric: Memory, affect and judgment normal.    Lab Results  Component Value Date   TSH 2.13 10/31/2010   Lab Results  Component Value Date   WBC 6.9 10/31/2010   HGB 13.2 10/31/2010   HCT 38.4 10/31/2010  MCV 86.7 10/31/2010   PLT 303.0 10/31/2010   No results found for this basename: CREATININE, BUN, NA, K, CL, CO2   Lab Results  Component Value Date   ALT 20 10/31/2010   AST 18 10/31/2010   ALKPHOS 97 10/31/2010   BILITOT 0.2* 10/31/2010   Lab Results  Component Value Date   CHOL 194 10/31/2010   Lab Results  Component Value Date   HDL 46.80 10/31/2010   Lab Results  Component Value Date   LDLCALC 121* 10/31/2010   Lab Results  Component Value Date   TRIG 132.0 10/31/2010   Lab Results  Component Value Date   CHOLHDL 4 10/31/2010     Assessment & Plan  RESTLESS LEG SYNDROME She has  found Requip helpful but uses it infrequently because she wakes up groggy the next morning and she is worried about habituating. She can use a half a tab if she finds that helpful and continuue to use the meds prn.   PERIPHERAL NEUROPATHY, FEET Patient continues to struggling with tingling and burning in her feet and is finding it increasingly uncomfortable. She also continues to have paresthesias on the right side of her neck and head. She will be referred to neurology at this time for further evaluation. She is notified that her labs were largely unremarkable and she was given a copy of them. Her RMSF IGG did come back equivocal and she is notified this is likely a false positive. No changes to therapy at this time.   ESSENTIAL HYPERTENSION, BENIGN Numbers remain hi will add Chlorthalidone and recheck BP and renal panel in one month or as needed.  DM By patient report previously diet controlled, will need to check hgba1c at next visit. Avoid simple carbs  Overweight Patient is given a handout on the DASH diet and asked to consider trying it. She is encouraged to increase exercise as tolerated.  Hyperlipidemia Mild elevation in LDL cholesterol. No medications at this time but she is encouraged to avoid trans fats and eat a low fat, hi fiber diet

## 2010-12-06 NOTE — Assessment & Plan Note (Signed)
By patient report previously diet controlled, will need to check hgba1c at next visit. Avoid simple carbs

## 2010-12-06 NOTE — Assessment & Plan Note (Addendum)
Patient continues to struggling with tingling and burning in her feet and is finding it increasingly uncomfortable. She also continues to have paresthesias on the right side of her neck and head. She will be referred to neurology at this time for further evaluation. She is notified that her labs were largely unremarkable and she was given a copy of them. Her RMSF IGG did come back equivocal and she is notified this is likely a false positive. No changes to therapy at this time.

## 2010-12-06 NOTE — Progress Notes (Signed)
[name] [id number] 1966-07-20 12/06/2010      Progress Note-Follow Up  Subjective  Chief Complaint  Chief Complaint  Patient presents with  . Follow-up    on labs    HPI  Patient is a 45 year old Caucasian female in today for followup. She has no new complaints today but does have persistent paresthesias and tingling along the right side of her neck and head. She does not peripheral neuropathy tingling and numbness in her feet is worsening. This is bilateral and keeps her up at night frequently. She notes Requip is helping with the restless leg discomfort and she does sleep better when she takes it. Unfortunately she was groggy the next morning and is only taking it infrequently due to concerns of habituation. She denies any recent illness, fevers, chills, chest pain, palpitations, shortness of breath, GI or GU concerns at this time. She does note she had one episode of sharp fleeting chest pain she is associated with heavy lifting which did not have any other symptoms associated with it and has not recurred. Upon questioning she notes 1 tick bite occurred about 3 or 4 years ago on her back which she is not sure how long the tick was present. She did have significant redness and swelling around the area and does remember flulike symptoms at that time. She describes malaise, low-grade fevers. She also had a tick bite sometime in  6 months under her left breast but did have some erythema associated with it but did not have any other associated symptoms. She denies following any dietary restrictions or exercise regiment in order to control her cholesterol, DM or HTN, she acknowledges sleeping poorly last night and struggling with persistent stress.   Past Medical History  Diagnosis Date  . Hypertension   . Hyperlipidemia 12/06/2010  . Overweight 12/06/2010  . NEPHROLITHIASIS, HX OF 10/07/2010    History reviewed. No pertinent past surgical history.  Family History  Problem Relation Age  of Onset  . Arthritis Mother   . Stroke Brother     previous smoker  . Alcohol abuse Brother     in remission  . Leukemia Brother   . Diabetes Paternal Grandmother     History   Social History  . Marital Status: Married    Spouse Name: N/A    Number of Children: N/A  . Years of Education: N/A   Occupational History  . Not on file.   Social History Main Topics  . Smoking status: Former Smoker -- 0.5 packs/day    Quit date: 09/08/2005  . Smokeless tobacco: Never Used  . Alcohol Use: No     occasional  . Drug Use: No  . Sexually Active: Yes -- Female partner(s)   Other Topics Concern  . Not on file   Social History Narrative  . No narrative on file    Current Outpatient Prescriptions on File Prior to Visit  Medication Sig Dispense Refill  . metoprolol (TOPROL-XL) 25 MG 24 hr tablet Take 25 mg by mouth daily.        Marland Kitchen rOPINIRole (REQUIP) 0.5 MG tablet Take 0.5 mg by mouth at bedtime. 1-2 tabs         No Known Allergies  Review of Systems  Review of Systems  Constitutional: Negative for fever and malaise/fatigue.  HENT: Negative for congestion.   Eyes: Negative for discharge.  Respiratory: Negative for shortness of breath.   Cardiovascular: Positive for chest pain. Negative for palpitations and leg swelling.  She notes one episode of sharp, quick Chest Pain after excessive heavy lifting in the yard, no associated SOB/palp/nausea or diaphoresis  Gastrointestinal: Negative for nausea, abdominal pain and diarrhea.  Genitourinary: Negative for dysuria.  Musculoskeletal: Negative for falls.  Skin: Negative for rash.  Neurological: Positive for tingling and sensory change. Negative for focal weakness, loss of consciousness and headaches.       Patient with persistent numbness, paresthesias along right side of neck and right side of head and worsening tingling and numbness in b/l feet.  Endo/Heme/Allergies: Negative for polydipsia.  Psychiatric/Behavioral: Negative  for depression and suicidal ideas. The patient is not nervous/anxious and does not have insomnia.     Objective  BP 157/94  Pulse 77  Temp(Src) 97.8 F (36.6 C) (Oral)  Ht 5\' 2"  (1.575 m)  Wt 151 lb (68.493 kg)  BMI 27.62 kg/m2  SpO2 98%  LMP 10/23/2010  Physical Exam  Physical Exam  Constitutional: She is oriented to person, place, and time and well-developed, well-nourished, and in no distress. No distress.  HENT:  Head: Normocephalic and atraumatic.  Eyes: Conjunctivae are normal.  Neck: Neck supple. No thyromegaly present.  Cardiovascular: Normal rate, regular rhythm and normal heart sounds.   No murmur heard. Pulmonary/Chest: Effort normal and breath sounds normal. She has no wheezes.  Abdominal: She exhibits no distension and no mass.  Musculoskeletal: She exhibits no edema.  Lymphadenopathy:    She has no cervical adenopathy.  Neurological: She is alert and oriented to person, place, and time.  Skin: Skin is warm and dry. No rash noted. She is not diaphoretic.  Psychiatric: Memory, affect and judgment normal.    Lab Results  Component Value Date   TSH 2.13 10/31/2010   Lab Results  Component Value Date   WBC 6.9 10/31/2010   HGB 13.2 10/31/2010   HCT 38.4 10/31/2010   MCV 86.7 10/31/2010   PLT 303.0 10/31/2010   No results found for this basename: CREATININE,  BUN,  NA,  K,  CL,  CO2   Lab Results  Component Value Date   ALT 20 10/31/2010   AST 18 10/31/2010   ALKPHOS 97 10/31/2010   BILITOT 0.2* 10/31/2010   Lab Results  Component Value Date   CHOL 194 10/31/2010   Lab Results  Component Value Date   HDL 46.80 10/31/2010   Lab Results  Component Value Date   LDLCALC 121* 10/31/2010   Lab Results  Component Value Date   TRIG 132.0 10/31/2010   Lab Results  Component Value Date   CHOLHDL 4 10/31/2010     Assessment & Plan  RESTLESS LEG SYNDROME She has found Requip helpful but uses it infrequently because she wakes up groggy the next morning and  she is worried about habituating. She can use a half a tab if she finds that helpful and continuue to use the meds prn.   PERIPHERAL NEUROPATHY, FEET Patient continues to struggling with tingling and burning in her feet and is finding it increasingly uncomfortable. She also continues to have paresthesias on the right side of her neck and head. She will be referred to neurology at this time for further evaluation. She is notified that her labs were largely unremarkable and she was given a copy of them. Her RMSF IGG did come back equivocal and she is notified this is likely a false positive. No changes to therapy at this time.   ESSENTIAL HYPERTENSION, BENIGN Numbers remain hi will add Chlorthalidone and recheck BP  and renal panel in one month or as needed.  DM By patient report previously diet controlled, will need to check hgba1c at next visit. Avoid simple carbs  Overweight Patient is given a handout on the DASH diet and asked to consider trying it. She is encouraged to increase exercise as tolerated.  Hyperlipidemia Mild elevation in LDL cholesterol. No medications at this time but she is encouraged to avoid trans fats and eat a low fat, hi fiber diet  NEPHROLITHIASIS, HX OF No recent episodes, encouraged ongoing adequate hydration

## 2010-12-06 NOTE — Assessment & Plan Note (Signed)
She has found Requip helpful but uses it infrequently because she wakes up groggy the next morning and she is worried about habituating. She can use a half a tab if she finds that helpful and continuue to use the meds prn.

## 2011-01-10 ENCOUNTER — Ambulatory Visit (INDEPENDENT_AMBULATORY_CARE_PROVIDER_SITE_OTHER): Payer: BC Managed Care – PPO | Admitting: Family Medicine

## 2011-01-10 ENCOUNTER — Encounter: Payer: Self-pay | Admitting: Family Medicine

## 2011-01-10 DIAGNOSIS — E663 Overweight: Secondary | ICD-10-CM

## 2011-01-10 DIAGNOSIS — G609 Hereditary and idiopathic neuropathy, unspecified: Secondary | ICD-10-CM

## 2011-01-10 DIAGNOSIS — E785 Hyperlipidemia, unspecified: Secondary | ICD-10-CM

## 2011-01-10 DIAGNOSIS — E119 Type 2 diabetes mellitus without complications: Secondary | ICD-10-CM

## 2011-01-10 DIAGNOSIS — R209 Unspecified disturbances of skin sensation: Secondary | ICD-10-CM

## 2011-01-10 DIAGNOSIS — I10 Essential (primary) hypertension: Secondary | ICD-10-CM

## 2011-01-10 DIAGNOSIS — G43009 Migraine without aura, not intractable, without status migrainosus: Secondary | ICD-10-CM

## 2011-01-10 DIAGNOSIS — G2581 Restless legs syndrome: Secondary | ICD-10-CM

## 2011-01-10 DIAGNOSIS — R200 Anesthesia of skin: Secondary | ICD-10-CM

## 2011-01-10 NOTE — Patient Instructions (Signed)
Hypercholesterolemia High Blood Cholesterol Cholesterol is a white, waxy, fat-like protein needed by your body in small amounts. The liver makes all the cholesterol you need. It is carried from the liver by the blood through the blood vessels. Deposits (plaque) may build up on blood vessel walls. This makes the arteries narrower and stiffer. Plaque increases the risk for heart attack and stroke. You cannot feel your cholesterol level even if it is very high. The only way to know is by a blood test to check your lipid (fats) levels. Once you know your cholesterol levels, you should keep a record of the test results. Work with your caregiver to to keep your levels in the desired range. WHAT THE RESULTS MEAN:  Total cholesterol is a rough measure of all the cholesterol in your blood.   LDL is the so-called bad cholesterol. This is the type that deposits cholesterol in the walls of the arteries. You want this level to be low.   HDL is the good cholesterol because it cleans the arteries and carries the LDL away. You want this level to be high.   Triglycerides are fat that the body can either burn for energy or store. High levels are closely linked to heart disease.  DESIRED LEVELS:  Total cholesterol below 200.   LDL below 100 for people at risk, below 70 for very high risk.   HDL above 50 is good, above 60 is best.   Triglycerides below 150.  HOW TO LOWER YOUR CHOLESTEROL:  Diet.   Choose fish or white meat chicken and [place], roasted or baked. Limit fatty cuts of red meat, fried foods, and processed meats, such as sausage and lunch meat.   Eat lots of fresh fruits and vegetables. Choose whole grains, beans, pasta, potatoes and cereals.   Use only small amounts of olive, corn or canola oils. Avoid butter, mayonnaise, shortening or palm kernel oils. Avoid foods with trans-fats.   Use skim/nonfat milk and low-fat/nonfat yogurt and cheeses. Avoid whole milk, cream, ice cream, egg yolks and  cheeses. Healthy desserts include angel food cake, gingersnaps, animal crackers, hard candy, popsicles, and low-fat/nonfat frozen yogurt. Avoid pastries, cakes, pies and cookies.   Exercise.   A regular program helps decrease LDL and raises HDL.   Helps with weight control.   Do things that increase your activity level like gardening, walking, or taking the stairs.   Medication.   May be prescribed by your caregiver to help lowering cholesterol and the risk for heart disease.   You may need medicine even if your levels are normal if you have several risk factors.  HOME CARE INSTRUCTIONS  Follow your diet and exercise programs as suggested by your caregiver.   Take medications as directed.   Have blood work done when your caregiver feels it is necessary.  MAKE SURE YOU:   Understand these instructions.   Will watch your condition.   Will get help right away if you are not doing well or get worse.  Document Released: 08/25/2005 Document Re-Released: 08/07/2008 [facility] Patient Information 2011 [place], [place].  Add a fish oil cap daily

## 2011-01-10 NOTE — Assessment & Plan Note (Signed)
Mild, avoid trans fats and simple carbs, increase exercise and start a fish oil cap daily

## 2011-01-10 NOTE — Assessment & Plan Note (Signed)
This symptom has been present and is stable. Is not progressing, no obvious cause found with lab work, have set up neurologic referral today

## 2011-01-10 NOTE — Assessment & Plan Note (Signed)
Requip helpful when she takes it. Continue same when able to sleep more than 3 hours in a night

## 2011-01-10 NOTE — Assessment & Plan Note (Signed)
Avoid simple carbs and needs labs including HGBA1C at next visit

## 2011-01-10 NOTE — Progress Notes (Signed)
[name] [id number] [birth year] 01/10/2011      Progress Note-Follow Up  Subjective  Chief Complaint  Chief Complaint  Patient presents with  . Follow-up    HPI  A 45 year old Caucasian female in today for follow up of multiple medical problems. She is complaining of worsening numbness and what she describes as tightness and heaviness on the right side of her body. Symptoms worsened roughly a week ago that have been ongoing for very long time. She describes the symptoms still does worsen her shoulder up her neck and then onto the right side of her face and right lower region of her scalp. She does have a sensation of weakness and numbness also along her right axillary chest wall but didn't her hip and upper legs are spared. She has persistent numbness and tingling in bilateral feet that is unchanged. The Requip helps at night when she is in the time to sleep she takes it and that is somewhat helpful for back discomfort. No recent illness falls chills no difficulty with her recent medicine. Recent lab work showed an equivocal titer of RMSF IgG at 1.0 of note her mouth says that she was roughly 45 years of age she was diagnosed with a [facility] spotted fever but she is unsure how that was diagnosed. Patient herself denies fevers, chills. Does have fatigue but gets roughly only 3 hours of sleep almost weeknights due to her job. She comes today straightforward having worked all night. On the weekends she does get more sleep. She has been under a great deal of stress secondary to her brother just recently dying in managing his gait. She does acknowledge she had a recent episode of fleeting sharp chest pain. But she had just lifted a heavy CAT scan. She had no associated symptoms such as palpitations, shortness of breath, diaphoresis or nausea and the pain has not recurred she does report she had a full cardiac workup including echo and stress testing back last October which was unremarkable but she  has not gotten [name] the results to date she agrees to sign a release and get that information for [name]. Patient denies any complaints such as GI chief concerns or worsening headaches.  Past Medical History  Diagnosis Date  . Hypertension   . Hyperlipidemia 12/06/2010  . Overweight 12/06/2010  . NEPHROLITHIASIS, HX OF 10/07/2010  . RESTLESS LEG SYNDROME 10/25/2010  . PERIPHERAL NEUROPATHY, FEET 10/07/2010  . HEART MURMUR, HX OF 10/07/2010  . Essential hypertension, benign 10/07/2010  . DM 10/07/2010  . Disturbance of skin sensation 10/07/2010  . COMMON MIGRAINE 10/07/2010    History reviewed. No pertinent past surgical history.  Family History  Problem Relation Age of Onset  . Arthritis Mother   . Stroke Brother     previous smoker  . Alcohol abuse Brother     in remission  . Leukemia Brother   . Diabetes Paternal Grandmother     History   Social History  . Marital Status: Married    Spouse Name: N/A    Number of Children: N/A  . Years of Education: N/A   Occupational History  . Not on file.   Social History Main Topics  . Smoking status: Former Smoker -- 0.5 packs/day    Quit date: 09/08/2005  . Smokeless tobacco: Never Used  . Alcohol Use: No     occasional  . Drug Use: No  . Sexually Active: Yes -- Female partner(s)   Other Topics Concern  . Not on  file   Social History Narrative  . No narrative on file    Current Outpatient Prescriptions on File Prior to Visit  Medication Sig Dispense Refill  . chlorthalidone (HYGROTON) 25 MG tablet Take 1 tablet (25 mg total) by mouth daily.  30 tablet  3  . metoprolol (TOPROL-XL) 25 MG 24 hr tablet Take 25 mg by mouth daily.        Marland Kitchen rOPINIRole (REQUIP) 0.5 MG tablet Take 0.5 mg by mouth at bedtime. 1-2 tabs         No Known Allergies  Review of Systems  Review of Systems  Constitutional: Positive for malaise/fatigue. Negative for fever.  HENT: Negative for congestion.   Eyes: Negative for discharge.  Respiratory: Negative  for shortness of breath.   Cardiovascular: Negative for chest pain, palpitations and leg swelling.  Gastrointestinal: Negative for nausea, abdominal pain and diarrhea.  Genitourinary: Negative for dysuria.  Musculoskeletal: Negative for falls.  Skin: Negative for rash.  Neurological: Negative for loss of consciousness and headaches.  Endo/Heme/Allergies: Negative for polydipsia.  Psychiatric/Behavioral: Negative for depression, suicidal ideas, hallucinations and memory loss. The patient is nervous/anxious. The patient does not have insomnia.        Her brother just died so she has been under increased stress but she feels she is managing it OK    Objective  BP 144/85  Pulse 80  Temp(Src) 98 F (36.7 C) (Oral)  Ht 5\' 2"  (1.575 m)  Wt 151 lb 12.8 oz (68.856 kg)  BMI 27.76 kg/m2  SpO2 98%  LMP 12/30/2010  Physical Exam  Physical Exam  Constitutional: She is oriented to person, place, and time and well-developed, well-nourished, and in no distress. No distress.  HENT:  Head: Normocephalic and atraumatic.  Eyes: Conjunctivae are normal.  Neck: Neck supple. No thyromegaly present.  Cardiovascular: Normal rate, regular rhythm and normal heart sounds.   No murmur heard. Pulmonary/Chest: Effort normal and breath sounds normal. She has no wheezes.  Abdominal: She exhibits no distension and no mass.  Musculoskeletal: She exhibits no edema.  Lymphadenopathy:    She has no cervical adenopathy.  Neurological: She is alert and oriented to person, place, and time. Gait normal.  Skin: Skin is warm and dry. No rash noted. She is not diaphoretic.  Psychiatric: Memory, affect and judgment normal.    Lab Results  Component Value Date   TSH 2.13 10/31/2010   Lab Results  Component Value Date   WBC 6.9 10/31/2010   HGB 13.2 10/31/2010   HCT 38.4 10/31/2010   MCV 86.7 10/31/2010   PLT 303.0 10/31/2010   No results found for this basename: CREATININE, BUN, NA, K, CL, CO2   Lab Results    Component Value Date   ALT 20 10/31/2010   AST 18 10/31/2010   ALKPHOS 97 10/31/2010   BILITOT 0.2* 10/31/2010   Lab Results  Component Value Date   CHOL 194 10/31/2010   Lab Results  Component Value Date   HDL 46.80 10/31/2010   Lab Results  Component Value Date   LDLCALC 121* 10/31/2010   Lab Results  Component Value Date   TRIG 132.0 10/31/2010   Lab Results  Component Value Date   CHOLHDL 4 10/31/2010     Assessment & Plan  RESTLESS LEG SYNDROME Requip helpful when she takes it. Continue same when able to sleep more than 3 hours in a night  PERIPHERAL NEUROPATHY, FEET This symptom has been present and is stable. Is not progressing,  no obvious cause found with lab work, have set up neurologic referral today  Overweight Continues to get poor sleep, only getting roughly 3 hours a night during the weekdays, does better on the weekend. She is also not exercising or eating right for multiple reasons including the recent death of her brother. She is encouraged to try and take care of herself and to sleep more hours during the week. Patient noncommital. Patient needs too avoid trans fats and simple carbs  Hyperlipidemia Mild, avoid trans fats and simple carbs, increase exercise and start a fish oil cap daily  ESSENTIAL HYPERTENSION, BENIGN Mildly elevated but came straight from work this am without sleeping. She needs to minimize sodium and we will continue to monitor  DM Avoid simple carbs and needs labs including HGBA1C at next visit  DISTURBANCE OF SKIN SENSATION Patient continues to c/o feeling numb/heavy/thick on right side of her body, most notable in the shoulder, up the neck and onto the parietal resion of her scalp and face. She does however note significant symptoms her right axillary CW and region. She reports the thickness has gotten notably worse over the past week. Her previous lab work came back largely unremarkable except for an equivocal finding on her RMSF IgG.  We will refer to neurology for further input at this time. Patient agrees to start a fatty acid supplement  COMMON MIGRAINE Patient offers no c/o in this regard today

## 2011-01-10 NOTE — Assessment & Plan Note (Signed)
Continues to get poor sleep, only getting roughly 3 hours a night during the weekdays, does better on the weekend. She is also not exercising or eating right for multiple reasons including the recent death of her brother. She is encouraged to try and take care of herself and to sleep more hours during the week. Patient noncommital. Patient needs too avoid trans fats and simple carbs

## 2011-01-10 NOTE — Assessment & Plan Note (Signed)
Mildly elevated but came straight from work this am without sleeping. She needs to minimize sodium and we will continue to monitor

## 2011-01-10 NOTE — Assessment & Plan Note (Signed)
Patient continues to c/o feeling numb/heavy/thick on right side of her body, most notable in the shoulder, up the neck and onto the parietal resion of her scalp and face. She does however note significant symptoms her right axillary CW and region. She reports the thickness has gotten notably worse over the past week. Her previous lab work came back largely unremarkable except for an equivocal finding on her RMSF IgG. We will refer to neurology for further input at this time. Patient agrees to start a fatty acid supplement

## 2011-01-10 NOTE — Assessment & Plan Note (Signed)
Patient offers no c/o in this regard today

## 2011-04-11 ENCOUNTER — Other Ambulatory Visit (INDEPENDENT_AMBULATORY_CARE_PROVIDER_SITE_OTHER): Payer: BC Managed Care – PPO

## 2011-04-11 DIAGNOSIS — E119 Type 2 diabetes mellitus without complications: Secondary | ICD-10-CM

## 2011-04-11 DIAGNOSIS — E785 Hyperlipidemia, unspecified: Secondary | ICD-10-CM

## 2011-04-11 LAB — RENAL FUNCTION PANEL
Albumin: 3.9 g/dL (ref 3.5–5.2)
BUN: 16 mg/dL (ref 6–23)
CO2: 29 mEq/L (ref 19–32)
Calcium: 8.8 mg/dL (ref 8.4–10.5)
Chloride: 101 mEq/L (ref 96–112)
Creatinine, Ser: 0.6 mg/dL (ref 0.4–1.2)
GFR: 125.75 mL/min (ref >60.00)
Glucose, Bld: 335 mg/dL — ABNORMAL HIGH (ref 70–99)
Phosphorus: 3.2 mg/dL (ref 2.3–4.6)
Potassium: 4.2 mEq/L (ref 3.5–5.1)
Sodium: 137 mEq/L (ref 135–145)

## 2011-04-11 LAB — HEMOGLOBIN A1C: Hgb A1c MFr Bld: 13.6 % — ABNORMAL HIGH (ref 4.6–6.5)

## 2011-04-11 MED ORDER — METFORMIN HCL 500 MG PO TABS: 500.0000 mg | ORAL_TABLET | Freq: Two times a day (BID) | ORAL | Status: DC

## 2011-04-11 NOTE — Progress Notes (Signed)
Pt informed and I scheduled appt for Monday morning

## 2011-04-14 ENCOUNTER — Encounter: Payer: Self-pay | Admitting: Family Medicine

## 2011-04-14 ENCOUNTER — Ambulatory Visit (INDEPENDENT_AMBULATORY_CARE_PROVIDER_SITE_OTHER): Payer: BC Managed Care – PPO | Admitting: Family Medicine

## 2011-04-14 DIAGNOSIS — I10 Essential (primary) hypertension: Secondary | ICD-10-CM

## 2011-04-14 DIAGNOSIS — R209 Unspecified disturbances of skin sensation: Secondary | ICD-10-CM

## 2011-04-14 DIAGNOSIS — E119 Type 2 diabetes mellitus without complications: Secondary | ICD-10-CM

## 2011-04-14 DIAGNOSIS — G609 Hereditary and idiopathic neuropathy, unspecified: Secondary | ICD-10-CM

## 2011-04-14 MED ORDER — LISINOPRIL 5 MG PO TABS: 5.0000 mg | ORAL_TABLET | Freq: Every day | ORAL | Status: DC

## 2011-04-14 NOTE — Assessment & Plan Note (Signed)
Is tolerating Gabapentin but does not note any improvement, will consider increasing dose at next visit.

## 2011-04-14 NOTE — Assessment & Plan Note (Signed)
Patient is in today due to very hi HGBA1C of 13.6. Patient denies any polyuria, polydipsia. Has not picked up the Metformin she was prescribed last week, is going to get it now. Is given a Freestyle Freedom Lite meter and she is asked to check her sugars each am prior to eating and any time she feels any symptoms, may increase frequency as patient acclimates to checking. No simple carbs and monitor carb intake, record sugar numbers and call if numbers running above mid 200s see her in follow up in 2 weeks or as needed.

## 2011-04-14 NOTE — Progress Notes (Signed)
[name] [id number] 1966-02-20 04/14/2011      Progress Note-Follow Up  Subjective  Chief Complaint  Chief Complaint  Patient presents with  . Follow-up    on lab results    HPI  Patient is a 45 yo caucasian female is here today secondary to a very hi HGBA1C. She had a known history of diabetes but had never taken meds and was not having any acute symptoms. We checked her hgba1c for a form and it was significantly elevated. We called in some Metformin but she has not started it yet secondary to being out of town. She is tired today but is just coming off her chest. She does have chronic fatigue which has not worsened. She does not check her sugars routinely. Has not been watching her diet or minimizing her carbs. She continues to struggle with the neuropathic symptoms. She is peripheral neuropathy burning and tingling in her feet and gabapentin has not notably infected the symptoms. He is not having any difficulty with the gabapentin that she is aware of however. She does continue to struggle with the skin disturbance on the right side of her scalp and her neck. She is following with neurology at this time. Per the patient's and imaging didn't suggest some right-sided lesions and then she had a spinal tap which did not confirm the diagnosis so at this point it is unclear what her primary diagnosis for her neuropathic symptoms is. No recent illness, fevers, chills, chest pain, palpitations, shortness of breath, GI or GU complaints noted at today's visit.  Past Medical History  Diagnosis Date  . Hypertension   . Hyperlipidemia 12/06/2010  . Overweight 12/06/2010  . NEPHROLITHIASIS, HX OF 10/07/2010  . RESTLESS LEG SYNDROME 10/25/2010  . PERIPHERAL NEUROPATHY, FEET 10/07/2010  . HEART MURMUR, HX OF 10/07/2010  . Essential hypertension, benign 10/07/2010  . DM 10/07/2010  . Disturbance of skin sensation 10/07/2010  . COMMON MIGRAINE 10/07/2010    History reviewed. No pertinent past surgical  history.  Family History  Problem Relation Age of Onset  . Arthritis Mother   . Stroke Brother     previous smoker  . Alcohol abuse Brother     in remission  . Leukemia Brother   . Diabetes Paternal Grandmother     History   Social History  . Marital Status: Married    Spouse Name: N/A    Number of Children: N/A  . Years of Education: N/A   Occupational History  . Not on file.   Social History Main Topics  . Smoking status: Former Smoker -- 0.5 packs/day    Quit date: 09/08/2005  . Smokeless tobacco: Never Used  . Alcohol Use: No     occasional  . Drug Use: No  . Sexually Active: Yes -- Female partner(s)   Other Topics Concern  . Not on file   Social History Narrative  . No narrative on file    Current Outpatient Prescriptions on File Prior to Visit  Medication Sig Dispense Refill  . chlorthalidone (HYGROTON) 25 MG tablet Take 1 tablet (25 mg total) by mouth daily.  30 tablet  3  . metoprolol (TOPROL-XL) 25 MG 24 hr tablet Take 25 mg by mouth daily.        Marland Kitchen rOPINIRole (REQUIP) 0.5 MG tablet Take 0.5 mg by mouth at bedtime. 1-2 tabs       . metFORMIN (GLUCOPHAGE) 500 MG tablet Take 1 tablet (500 mg total) by mouth 2 (two) times daily  with a meal.  60 tablet  1    No Known Allergies  Review of Systems  Review of Systems  Constitutional: Positive for malaise/fatigue. Negative for fever.  HENT: Negative for congestion.   Eyes: Negative for discharge.  Respiratory: Negative for shortness of breath.   Cardiovascular: Negative for chest pain, palpitations and leg swelling.  Gastrointestinal: Negative for nausea, abdominal pain and diarrhea.  Genitourinary: Negative for dysuria.  Musculoskeletal: Negative for falls.  Skin: Negative for rash.  Neurological: Positive for sensory change. Negative for loss of consciousness and headaches.  Endo/Heme/Allergies: Negative for polydipsia.  Psychiatric/Behavioral: Negative for depression and suicidal ideas. The patient is  not nervous/anxious and does not have insomnia.     Objective  BP 162/90  Pulse 78  Temp(Src) 98.1 F (36.7 C) (Oral)  Ht 5\' 2"  (1.575 m)  Wt 147 lb 12.8 oz (67.042 kg)  BMI 27.03 kg/m2  SpO2 99%  LMP 02/12/2011  Physical Exam  Physical Exam  Constitutional: She is oriented to person, place, and time and well-developed, well-nourished, and in no distress. No distress.  HENT:  Head: Normocephalic and atraumatic.  Eyes: Conjunctivae are normal.  Neck: Neck supple. No thyromegaly present.  Cardiovascular: Normal rate, regular rhythm and normal heart sounds.   No murmur heard. Pulmonary/Chest: Effort normal and breath sounds normal. She has no wheezes.  Abdominal: She exhibits no distension and no mass.  Musculoskeletal: She exhibits no edema.  Lymphadenopathy:    She has no cervical adenopathy.  Neurological: She is alert and oriented to person, place, and time.  Skin: Skin is warm and dry. No rash noted. She is not diaphoretic.  Psychiatric: Memory, affect and judgment normal.    Lab Results  Component Value Date   TSH 2.13 10/31/2010   Lab Results  Component Value Date   WBC 6.9 10/31/2010   HGB 13.2 10/31/2010   HCT 38.4 10/31/2010   MCV 86.7 10/31/2010   PLT 303.0 10/31/2010   Lab Results  Component Value Date   CREATININE 0.6 04/11/2011   BUN 16 04/11/2011   NA 137 04/11/2011   K 4.2 04/11/2011   CL 101 04/11/2011   CO2 29 04/11/2011   Lab Results  Component Value Date   ALT 20 10/31/2010   AST 18 10/31/2010   ALKPHOS 97 10/31/2010   BILITOT 0.2* 10/31/2010   Lab Results  Component Value Date   CHOL 194 10/31/2010   Lab Results  Component Value Date   HDL 46.80 10/31/2010   Lab Results  Component Value Date   LDLCALC 121* 10/31/2010   Lab Results  Component Value Date   TRIG 132.0 10/31/2010   Lab Results  Component Value Date   CHOLHDL 4 10/31/2010     Assessment & Plan  DM Patient is in today due to very hi HGBA1C of 13.6. Patient denies any polyuria,  polydipsia. Has not picked up the Metformin she was prescribed last week, is going to get it now. Is given a Freestyle Freedom Lite meter and she is asked to check her sugars each am prior to eating and any time she feels any symptoms, may increase frequency as patient acclimates to checking. No simple carbs and monitor carb intake, record sugar numbers and call if numbers running above mid 200s see her in follow up in 2 weeks or as needed.   ESSENTIAL HYPERTENSION, BENIGN Add Lisinopril 5 mg po daily and reassess at next visit  PERIPHERAL NEUROPATHY, FEET Is tolerating Gabapentin but does  not note any improvement, will consider increasing dose at next visit.  DISTURBANCE OF SKIN SENSATION Patient is noting ongoing numbness in right scalp, neck, is now following with neurology, diagnosis is still not established. Will continue to monitor, symptoms are stable

## 2011-04-14 NOTE — Assessment & Plan Note (Signed)
Add Lisinopril 5 mg po daily and reassess at next visit

## 2011-04-14 NOTE — Patient Instructions (Signed)
Diabetes Meal Planning Guide The diabetes meal planning guide is a tool to help you plan your meals and snacks. It is important for people with diabetes to manage their blood sugar levels. Choosing the right foods and the right amounts throughout your day will help control your blood sugar. Eating right can even help you improve your blood pressure and reach or maintain a healthy weight. CARBOHYDRATE COUNTING MADE EASY When you eat carbohydrates, they turn to sugar (glucose). This raises your blood sugar level. Counting carbohydrates can help you control this level so you feel better. When you plan your meals by counting carbohydrates, you can have more flexibility in what you eat and balance your medicine with your food intake. Carbohydrate counting simply means adding up the total amount of carbohydrate grams (g) in your meals or snacks. Try to eat about the same amount at each meal. Foods with carbohydrates are listed below. Each portion below is 1 carbohydrate serving or 15 grams of carbohydrates. Ask your dietician how many grams of carbohydrates you should eat at each meal or snack. Grains and Starches 1 slice bread 1/2 English muffin or hotdog/hamburger bun 3/4 cup cold cereal (unsweetened) 1/3 cup cooked pasta or rice 1/2 cup starchy vegetables (corn, potatoes, peas, beans, winter squash) 1 tortilla (6 inches) 1/4 bagel 1 waffle or pancake (size of a CD) 1/2 cup cooked cereal 4 to 6 small crackers *Whole grain is recommended Fruit 1 cup fresh unsweetened berries, melon, papaya, pineapple 1 small fresh fruit 1/2 banana or mango 1/2 cup fruit juice (4 ounces unsweetened) 1/2 cup canned fruit in natural juice or water 2 tablespoons dried fruit 12 to 15 grapes or cherries Milk and Yogurt 1 cup fat-free or 1% milk 1 cup soy milk 6 ounces light yogurt with sugar-free sweetener 6 ounces low-fat soy yogurt 6 ounces plain yogurt Vegetables 1 cup raw or 1/2 cup cooked is counted as 0  carbohydrates or a "free" food. If you eat 3 or more servings at one meal, count them as 1 carbohydrate serving. Other Carbohydrates 3/4 ounces chips or pretzels 1/2 cup ice cream or frozen yogurt 1/4 cup sherbet or sorbet 2 inch square cake, no frosting 1 tablespoon honey, sugar, jam, jelly, or syrup 2 small cookies 3 squares of graham crackers 3 cups popcorn 6 crackers 1 cup broth-based soup Count 1 cup casserole or other mixed foods as 2 carbohydrate servings. Foods with less than 20 calories in a serving may be counted as 0 carbohydrates or a "free" food. You may want to purchase a book or computer software that lists the carbohydrate gram counts of different foods. In addition, the nutrition facts panel on the labels of the foods you eat are a good source of this information. The label will tell you how big the serving size is and the total number of carbohydrate grams you will be eating per serving. Divide this number by 15 to obtain the number of carbohydrate servings in a portion. Remember: 1 carbohydrate serving equals 15 grams of carbohydrate. SERVING SIZES Measuring foods and serving sizes helps you make sure you are getting the right amount of food. The list below tells how big or small some common serving sizes are.  1 ounce (oz) of cheese.................................4 stacked dice.   2 to 3 oz cooked meat..................................Deck of cards.   1 teaspoon (tsp)............................................Tip of little finger.   1 tablespoon (tbs).........................................Thumb.   2 tbs.............................................................Golf ball.    cup...........................................................Half of a fist.   1 cup............................................................A fist.    SAMPLE DIABETES MEAL PLAN Below is a sample meal plan that includes foods from the grain and starches, dairy, vegetable, fruit, and  meat groups. A dietician can individualize a meal plan to fit your calorie needs and tell you the number of servings needed from each food group. However, controlling the total amount of carbohydrates in your meal or snack is more important than making sure you include all of the food groups at every meal. You may interchange carbohydrate containing foods (dairy, starches, and fruits). The meal plan below is an example of a 2000 calorie diet using carbohydrate counting. This meal plan has 17 carbohydrate servings (carb choices). Breakfast 1 cup oatmeal (2 carb choices) 3/4 cup light yogurt (1 carb choice) 1 cup blueberries (1 carb choice) 1/4 cup almonds  Snack 1 large apple (2 carb choices) 1 low-fat string cheese stick  Lunch Chicken breast salad:  1 cup spinach   1/4 cup chopped tomatoes   2 oz chicken breast, sliced   2 tbs low-fat [place] dressing  12 whole-wheat crackers (2 carb choices) 12 to 15 grapes (1 carb choice) 1 cup low-fat milk (1 carb choice)  Snack 1 cup carrots 1/2 cup hummus (1 carb choice)  Dinner 3 oz broiled salmon 1 cup brown rice (3 carb choices)  Snack 1 1/2 cups steamed broccoli (1 carb choice) drizzled with 1 tsp olive oil and lemon juice 1 cup light pudding (2 carb choices)  DIABETES MEAL PLANNING WORKSHEET Your dietician can use this worksheet to help you decide how many servings of foods and what types of foods are right for you.  Breakfast Food Group and Servings Carb Choices Grain/Starches _______________________________________ Dairy ______________________________________________ Vegetable _______________________________________ Fruit _______________________________________________ Meat _______________________________________________ Fat _____________________________________________ Lunch Food Group and Servings Carb Choices Grain/Starches ________________________________________ Dairy _______________________________________________ Fruit  ________________________________________________ Meat ________________________________________________ Fat _____________________________________________ Dinner Food Group and Servings Carb Choices Grain/Starches ________________________________________ Dairy _______________________________________________ Fruit ________________________________________________ Meat ________________________________________________ Fat _____________________________________________ Snacks Food Group and Servings Carb Choices Grain/Starches ________________________________________ Dairy _______________________________________________ Vegetable ________________________________________ Fruit ________________________________________________ Meat ________________________________________________ Fat _____________________________________________ Daily Totals Starches _________________________ Vegetable __________________________ Fruit ______________________________ Dairy ______________________________ Meat ______________________________ Fat ________________________________  Document Released: 05/22/2005 Document Re-Released: 02/12/2010 [employer] Patient Information 2011 [place], [place].    Stop sweet drinks, use complex instead of simple (white carbs) 1 carb per meal

## 2011-04-14 NOTE — Assessment & Plan Note (Signed)
Patient is noting ongoing numbness in right scalp, neck, is now following with neurology, diagnosis is still not established. Will continue to monitor, symptoms are stable

## 2011-04-28 ENCOUNTER — Ambulatory Visit (INDEPENDENT_AMBULATORY_CARE_PROVIDER_SITE_OTHER): Payer: BC Managed Care – PPO | Admitting: Family Medicine

## 2011-04-28 ENCOUNTER — Encounter: Payer: Self-pay | Admitting: Family Medicine

## 2011-04-28 DIAGNOSIS — E663 Overweight: Secondary | ICD-10-CM

## 2011-04-28 DIAGNOSIS — I10 Essential (primary) hypertension: Secondary | ICD-10-CM

## 2011-04-28 DIAGNOSIS — E119 Type 2 diabetes mellitus without complications: Secondary | ICD-10-CM

## 2011-04-28 MED ORDER — LISINOPRIL 10 MG PO TABS: 10.0000 mg | ORAL_TABLET | Freq: Every day | ORAL | Status: DC

## 2011-04-28 MED ORDER — METFORMIN HCL 500 MG PO TABS: ORAL_TABLET | ORAL | Status: DC

## 2011-04-28 NOTE — Patient Instructions (Signed)
Diabetes and Exercise Regular exercise is important and can help:   Control blood glucose (sugar).   Decrease blood pressure.   Control blood lipids (cholesterol and triglycerides).   Improve overall health.  BENEFITS FROM EXERCISE:  Improved fitness.   Improved flexibility.   Improved endurance.   Increased bone density.   Weight control.   Increased muscle strength.   Decreased body fat.   Improvement of the body's use of a hormone called insulin.   Increased insulin sensitivity.   Reduction of insulin needs.   Helps you feel better.   Reduces stress and tension.  People with diabetes who add exercise to their lifestyle gain additional benefits.   Weight loss.   Reduces appetite.   Improves body's use of blood glucose (sugar).   Decreases risk factors for heart disease:   Lowering of cholesterol and triglycerides.   Raising the level of good cholesterol (high-density lipoproteins [HDL]).   Lowering blood sugar.   Decreases blood pressure.  TYPE 1 DIABETES AND EXERCISE  Exercise will usually lower your blood glucose.   If blood glucose is greater than 240 mg/dl, check urine ketones. If ketones are present, do not exercise.   Location of the insulin injection sites may need to be adjusted with exercise. Avoid injecting insulin into areas of the body that will be exercised. For example, avoid injecting insulin into:   The arms when playing tennis.   The legs when jogging. For more information, discuss this with your caregiver.   Keep a record of:   Food intake.   Type and amount of exercise.   Expected peak times of insulin action.   Blood glucose (sugar) levels.  Do this before, during and after exercise. Review your records with your caregiver(s). This will help you to develop guidelines for adjusting food intake and/or insulin amounts.  TYPE 2 DIABETES AND EXERCISE  Regular physical activity can help control blood glucose.   Exercise is  important because it may:   Increase the body's sensitivity to insulin.   Improve blood glucose control.   Exercise reduces the risk of heart disease. It decreases serum cholesterol and triglycerides. It also lowers blood pressure.   Those who take insulin or oral hypoglycemic agents should watch for signs of hypoglycemia. These signs include dizziness, shaking, sweating, chills and confusion.   Body water is lost during exercise. It must be replaced. This will help to avoid loss of body fluids (dehydration) and/or heat stroke.  Be sure to talk to your caregiver before starting an exercise program to make sure it is safe for you. Remember, any activity is better than none.  Document Released: 11/15/2003 Document Re-Released: 06/22/2009 [employer] Patient Information 2011 [employer]. 

## 2011-04-28 NOTE — Assessment & Plan Note (Signed)
Sugars still running above 200 in am will increase Metformin to 1000mg  to qhs and 500mg  qhs, patient has been trying to minimize carbs, discussed some more dietary adjustments, cutting back on sweetened yogurt etc

## 2011-04-28 NOTE — Progress Notes (Signed)
[name] [id number] [birth year] 04/28/2011      Progress Note-Follow Up  Subjective  Chief Complaint  Chief Complaint  Patient presents with  . Follow-up    2 week follow up    HPI  Patient is a 45 yo caucasian female in today for reevaluation of Diabetes and HTN. She is not experiencing any polyuria or polydipsia but her sugars continue to run over 200 over all times of the day. Recent am sugars 210 to 230 and pm numbers similar. She has tried to cut out carbs, has quit sweet drinks and is eating less white carbs, is trying to eat more fresh veg and proteins. Has tolerated the Lisinopril no c/o cough. No HA/CP/palp/SOB/GI or GU c/o.  Past Medical History  Diagnosis Date  . Hypertension   . Hyperlipidemia 12/06/2010  . Overweight 12/06/2010  . NEPHROLITHIASIS, HX OF 10/07/2010  . RESTLESS LEG SYNDROME 10/25/2010  . PERIPHERAL NEUROPATHY, FEET 10/07/2010  . HEART MURMUR, HX OF 10/07/2010  . Essential hypertension, benign 10/07/2010  . DM 10/07/2010  . Disturbance of skin sensation 10/07/2010  . COMMON MIGRAINE 10/07/2010    No past surgical history on file.  Family History  Problem Relation Age of Onset  . Arthritis Mother   . Stroke Brother     previous smoker  . Alcohol abuse Brother     in remission  . Leukemia Brother   . Diabetes Paternal Grandmother     History   Social History  . Marital Status: Married    Spouse Name: N/A    Number of Children: N/A  . Years of Education: N/A   Occupational History  . Not on file.   Social History Main Topics  . Smoking status: Former Smoker -- 0.5 packs/day    Quit date: 09/08/2005  . Smokeless tobacco: Never Used  . Alcohol Use: No     occasional  . Drug Use: No  . Sexually Active: Yes -- Female partner(s)   Other Topics Concern  . Not on file   Social History Narrative  . No narrative on file    Current Outpatient Prescriptions on File Prior to Visit  Medication Sig Dispense Refill  . chlorthalidone  (HYGROTON) 25 MG tablet Take 1 tablet (25 mg total) by mouth daily.  30 tablet  3  . gabapentin (NEURONTIN) 300 MG capsule       . metoprolol (TOPROL-XL) 25 MG 24 hr tablet Take 25 mg by mouth daily.        Marland Kitchen rOPINIRole (REQUIP) 0.5 MG tablet Take 0.5 mg by mouth at bedtime. 1-2 tabs         No Known Allergies  Review of Systems  Review of Systems  Constitutional: Negative for fever and malaise/fatigue.  HENT: Negative for congestion.   Eyes: Negative for discharge.  Respiratory: Negative for shortness of breath.   Cardiovascular: Negative for chest pain, palpitations and leg swelling.  Gastrointestinal: Positive for diarrhea. Negative for nausea, abdominal pain, constipation, blood in stool and melena.       Had diarrhea when first started Metformin but this has resolved  Genitourinary: Negative for dysuria and frequency.  Musculoskeletal: Negative for falls.  Skin: Negative for rash.  Neurological: Positive for tingling and sensory change. Negative for dizziness, tremors, speech change, focal weakness, seizures, loss of consciousness and headaches.       Right side of head and neck continue with paresthesias, had to reschedule next appt with neuro secondary to diarrhea from Metformin but that  has calmed down now so she agrees to reschedule that appt  Endo/Heme/Allergies: Negative for polydipsia.  Psychiatric/Behavioral: Negative for depression and suicidal ideas. The patient is not nervous/anxious and does not have insomnia.     Objective  BP 152/88  Pulse 76  Temp(Src) 98 F (36.7 C) (Oral)  Ht 5\' 2"  (1.575 m)  Wt 146 lb 12.8 oz (66.588 kg)  BMI 26.85 kg/m2  SpO2 98%  LMP 02/12/2011  Physical Exam  Physical Exam  Constitutional: She is oriented to person, place, and time and well-developed, well-nourished, and in no distress. No distress.  HENT:  Head: Normocephalic and atraumatic.  Eyes: Conjunctivae are normal.  Neck: Neck supple. No thyromegaly present.    Cardiovascular: Normal rate, regular rhythm and normal heart sounds.   No murmur heard. Pulmonary/Chest: Effort normal and breath sounds normal. She has no wheezes.  Abdominal: She exhibits no distension and no mass.  Musculoskeletal: She exhibits no edema.  Lymphadenopathy:    She has no cervical adenopathy.  Neurological: She is alert and oriented to person, place, and time.  Skin: Skin is warm and dry. No rash noted. She is not diaphoretic.  Psychiatric: Memory, affect and judgment normal.    Lab Results  Component Value Date   TSH 2.13 10/31/2010   Lab Results  Component Value Date   WBC 6.9 10/31/2010   HGB 13.2 10/31/2010   HCT 38.4 10/31/2010   MCV 86.7 10/31/2010   PLT 303.0 10/31/2010   Lab Results  Component Value Date   CREATININE 0.6 04/11/2011   BUN 16 04/11/2011   NA 137 04/11/2011   K 4.2 04/11/2011   CL 101 04/11/2011   CO2 29 04/11/2011   Lab Results  Component Value Date   ALT 20 10/31/2010   AST 18 10/31/2010   ALKPHOS 97 10/31/2010   BILITOT 0.2* 10/31/2010   Lab Results  Component Value Date   CHOL 194 10/31/2010   Lab Results  Component Value Date   HDL 46.80 10/31/2010   Lab Results  Component Value Date   LDLCALC 121* 10/31/2010   Lab Results  Component Value Date   TRIG 132.0 10/31/2010   Lab Results  Component Value Date   CHOLHDL 4 10/31/2010     Assessment & Plan  DM Sugars still running above 200 in am will increase Metformin to 1000mg  to qhs and 500mg  qhs, patient has been trying to minimize carbs, discussed some more dietary adjustments, cutting back on sweetened yogurt etc  ESSENTIAL HYPERTENSION, BENIGN Mildly elevated today will increase Lisinopril 10 mg po daily  Overweight Has been cutting back on carbs and is walking regularly will continue to monitor

## 2011-04-28 NOTE — Assessment & Plan Note (Signed)
Has been cutting back on carbs and is walking regularly will continue to monitor

## 2011-04-28 NOTE — Assessment & Plan Note (Signed)
Mildly elevated today will increase Lisinopril 10 mg po daily

## 2011-05-19 ENCOUNTER — Ambulatory Visit (INDEPENDENT_AMBULATORY_CARE_PROVIDER_SITE_OTHER): Payer: BC Managed Care – PPO | Admitting: Family Medicine

## 2011-05-19 ENCOUNTER — Encounter: Payer: Self-pay | Admitting: Family Medicine

## 2011-05-19 DIAGNOSIS — I10 Essential (primary) hypertension: Secondary | ICD-10-CM

## 2011-05-19 DIAGNOSIS — G609 Hereditary and idiopathic neuropathy, unspecified: Secondary | ICD-10-CM

## 2011-05-19 DIAGNOSIS — E119 Type 2 diabetes mellitus without complications: Secondary | ICD-10-CM

## 2011-05-19 DIAGNOSIS — E785 Hyperlipidemia, unspecified: Secondary | ICD-10-CM

## 2011-05-19 DIAGNOSIS — R209 Unspecified disturbances of skin sensation: Secondary | ICD-10-CM

## 2011-05-19 MED ORDER — METFORMIN HCL 1000 MG PO TABS: 1000.0000 mg | ORAL_TABLET | Freq: Two times a day (BID) | ORAL | Status: DC

## 2011-05-19 NOTE — Assessment & Plan Note (Signed)
Stable symptoms, she has her ongoing numbness, she is encouraged to continue her follow up with Neuro and to report any changing symptoms

## 2011-05-19 NOTE — Assessment & Plan Note (Signed)
Patient was unaware she had an appt this am. Came in with her husband this am for his appt and in her rush to get out of the house this am she forgot to her BP meds. Will not make any changes today, continue Lisinopril, Metoprolol and Chlorthalidone

## 2011-05-19 NOTE — Assessment & Plan Note (Signed)
She feels it is improving with sugar control. She is only using her Gabapentin and Requip intermittently. Feels she does not need them routinely they do help some but leave her fuzzy headed at times, may continue to use prn

## 2011-05-19 NOTE — Progress Notes (Signed)
[name] [id number] 05/15/1966 05/19/2011      Progress Note-Follow Up  Subjective  Chief Complaint  Chief Complaint  Patient presents with  . Follow-up    3 WEEK FOLLOW UP    HPI  45 year old Caucasian female in today for follow up. She came in to accompany her husband and his appointment and did not realize she had one. As a result she was rushing and did not take her blood pressure medications this morning. She denies any headache, chest pain, palpitations, shortness of breath, GI or GU complaints. She's had no recent illness, fevers, chills. She takes Requip infrequently for her restless leg syndrome and she takes her gabapentin infrequently for her neuropathy. Pupils caused excessive sedation but do help. She reports her peripheral neuropathy and feeling in her feet are improved as her sugars improved. The numbness on the right side of her body face and chest is stable. She has not seen urology recently. She is tolerating the metformin without GI upset. Her a.m. sugars in the 130s to 140s. Sugar this morning was 145. Sugars after eating a bit higher between 150 and 180s. Continues to deny polyuria and polydipsia  Past Medical History  Diagnosis Date  . Hypertension   . Hyperlipidemia 12/06/2010  . Overweight 12/06/2010  . NEPHROLITHIASIS, HX OF 10/07/2010  . RESTLESS LEG SYNDROME 10/25/2010  . PERIPHERAL NEUROPATHY, FEET 10/07/2010  . HEART MURMUR, HX OF 10/07/2010  . Essential hypertension, benign 10/07/2010  . DM 10/07/2010  . Disturbance of skin sensation 10/07/2010  . COMMON MIGRAINE 10/07/2010    History reviewed. No pertinent past surgical history.  Family History  Problem Relation Age of Onset  . Arthritis Mother   . Stroke Brother     previous smoker  . Alcohol abuse Brother     in remission  . Leukemia Brother   . Diabetes Paternal Grandmother     History   Social History  . Marital Status: Married    Spouse Name: N/A    Number of Children: N/A  . Years of  Education: N/A   Occupational History  . Not on file.   Social History Main Topics  . Smoking status: Former Smoker -- 0.5 packs/day    Quit date: 09/08/2005  . Smokeless tobacco: Never Used  . Alcohol Use: No     occasional  . Drug Use: No  . Sexually Active: Yes -- Female partner(s)   Other Topics Concern  . Not on file   Social History Narrative  . No narrative on file    Current Outpatient Prescriptions on File Prior to Visit  Medication Sig Dispense Refill  . chlorthalidone (HYGROTON) 25 MG tablet Take 1 tablet (25 mg total) by mouth daily.  30 tablet  3  . gabapentin (NEURONTIN) 300 MG capsule       . lisinopril (PRINIVIL,ZESTRIL) 10 MG tablet Take 1 tablet (10 mg total) by mouth daily.  30 tablet  11  . metFORMIN (GLUCOPHAGE) 500 MG tablet 1 tab in am po and 2 tabs in pm po  60 tablet  1  . metoprolol (TOPROL-XL) 25 MG 24 hr tablet Take 25 mg by mouth daily.        Marland Kitchen rOPINIRole (REQUIP) 0.5 MG tablet Take 0.5 mg by mouth at bedtime. 1-2 tabs         No Known Allergies  Review of Systems  Review of Systems  Constitutional: Negative for fever and malaise/fatigue.  HENT: Negative for congestion.   Eyes: Negative  for discharge.  Respiratory: Negative for shortness of breath.   Cardiovascular: Negative for chest pain, palpitations and leg swelling.  Gastrointestinal: Negative for nausea, abdominal pain and diarrhea.  Genitourinary: Negative for dysuria.  Musculoskeletal: Negative for falls.  Skin: Negative for rash.  Neurological: Positive for tingling and sensory change. Negative for loss of consciousness and headaches.       Tingling in feet improving, numbness on right side stable  Endo/Heme/Allergies: Negative for polydipsia.  Psychiatric/Behavioral: Negative for depression and suicidal ideas. The patient is not nervous/anxious and does not have insomnia.     Objective  BP 163/95  Pulse 73  Temp(Src) 97.7 F (36.5 C) (Oral)  Ht 5\' 2"  (1.575 m)  Wt 145 lb  12.8 oz (66.134 kg)  BMI 26.67 kg/m2  SpO2 99%  Physical Exam  Physical Exam  Constitutional: She is oriented to person, place, and time and well-developed, well-nourished, and in no distress. No distress.  HENT:  Head: Normocephalic and atraumatic.  Eyes: Conjunctivae are normal.  Neck: Neck supple. No thyromegaly present.  Cardiovascular: Normal rate, regular rhythm and normal heart sounds.   No murmur heard. Pulmonary/Chest: Effort normal and breath sounds normal. She has no wheezes.  Abdominal: She exhibits no distension and no mass.  Musculoskeletal: She exhibits no edema.  Lymphadenopathy:    She has no cervical adenopathy.  Neurological: She is alert and oriented to person, place, and time.  Skin: Skin is warm and dry. No rash noted. She is not diaphoretic.  Psychiatric: Memory, affect and judgment normal.    Lab Results  Component Value Date   TSH 2.13 10/31/2010   Lab Results  Component Value Date   WBC 6.9 10/31/2010   HGB 13.2 10/31/2010   HCT 38.4 10/31/2010   MCV 86.7 10/31/2010   PLT 303.0 10/31/2010   Lab Results  Component Value Date   CREATININE 0.6 04/11/2011   BUN 16 04/11/2011   NA 137 04/11/2011   K 4.2 04/11/2011   CL 101 04/11/2011   CO2 29 04/11/2011   Lab Results  Component Value Date   ALT 20 10/31/2010   AST 18 10/31/2010   ALKPHOS 97 10/31/2010   BILITOT 0.2* 10/31/2010   Lab Results  Component Value Date   CHOL 194 10/31/2010   Lab Results  Component Value Date   HDL 46.80 10/31/2010   Lab Results  Component Value Date   LDLCALC 121* 10/31/2010   Lab Results  Component Value Date   TRIG 132.0 10/31/2010   Lab Results  Component Value Date   CHOLHDL 4 10/31/2010     Assessment & Plan  ESSENTIAL HYPERTENSION, BENIGN Patient was unaware she had an appt this am. Came in with her husband this am for his appt and in her rush to get out of the house this am she forgot to her BP meds. Will not make any changes today, continue Lisinopril,  Metoprolol and Chlorthalidone  DISTURBANCE OF SKIN SENSATION Stable symptoms, she has her ongoing numbness, she is encouraged to continue her follow up with Neuro and to report any changing symptoms  Hyperlipidemia Repeat lipid panel at next visit, avoid trans fats  PERIPHERAL NEUROPATHY, FEET She feels it is improving with sugar control. She is only using her Gabapentin and Requip intermittently. Feels she does not need them routinely they do help some but leave her fuzzy headed at times, may continue to use prn  DM Improved numbers, she reports am sugars generally in the 130s and 140s  after eating later in the day she notes numbers 150 to 180. No polyuria or polydipsia. Increase Metformin to 1000mg  po bid and she is to call with any low or hi numbers that concern her over the next 2 months, avoid simple carbs

## 2011-05-19 NOTE — Patient Instructions (Signed)
Diabetes and Exercise Regular exercise is important and can help:   Control blood glucose (sugar).   Decrease blood pressure.   Control blood lipids (cholesterol and triglycerides).   Improve overall health.  BENEFITS FROM EXERCISE:  Improved fitness.   Improved flexibility.   Improved endurance.   Increased bone density.   Weight control.   Increased muscle strength.   Decreased body fat.   Improvement of the body's use of a hormone called insulin.   Increased insulin sensitivity.   Reduction of insulin needs.   Helps you feel better.   Reduces stress and tension.  People with diabetes who add exercise to their lifestyle gain additional benefits.   Weight loss.   Reduces appetite.   Improves body's use of blood glucose (sugar).   Decreases risk factors for heart disease:   Lowering of cholesterol and triglycerides.   Raising the level of good cholesterol (high-density lipoproteins [HDL]).   Lowering blood sugar.   Decreases blood pressure.  TYPE 1 DIABETES AND EXERCISE  Exercise will usually lower your blood glucose.   If blood glucose is greater than 240 mg/dl, check urine ketones. If ketones are present, do not exercise.   Location of the insulin injection sites may need to be adjusted with exercise. Avoid injecting insulin into areas of the body that will be exercised. For example, avoid injecting insulin into:   The arms when playing tennis.   The legs when jogging. For more information, discuss this with your caregiver.   Keep a record of:   Food intake.   Type and amount of exercise.   Expected peak times of insulin action.   Blood glucose (sugar) levels.  Do this before, during and after exercise. Review your records with your caregiver(s). This will help you to develop guidelines for adjusting food intake and/or insulin amounts.  TYPE 2 DIABETES AND EXERCISE  Regular physical activity can help control blood glucose.   Exercise is  important because it may:   Increase the body's sensitivity to insulin.   Improve blood glucose control.   Exercise reduces the risk of heart disease. It decreases serum cholesterol and triglycerides. It also lowers blood pressure.   Those who take insulin or oral hypoglycemic agents should watch for signs of hypoglycemia. These signs include dizziness, shaking, sweating, chills and confusion.   Body water is lost during exercise. It must be replaced. This will help to avoid loss of body fluids (dehydration) and/or heat stroke.  Be sure to talk to your caregiver before starting an exercise program to make sure it is safe for you. Remember, any activity is better than none.  Document Released: 11/15/2003 Document Re-Released: 06/22/2009 [employer] Patient Information 2011 [employer]. 

## 2011-05-19 NOTE — Assessment & Plan Note (Signed)
Improved numbers, she reports am sugars generally in the 130s and 140s after eating later in the day she notes numbers 150 to 180. No polyuria or polydipsia. Increase Metformin to 1000mg  po bid and she is to call with any low or hi numbers that concern her over the next 2 months, avoid simple carbs

## 2011-05-19 NOTE — Assessment & Plan Note (Signed)
Repeat lipid panel at next visit, avoid trans fats

## 2011-05-22 ENCOUNTER — Ambulatory Visit (INDEPENDENT_AMBULATORY_CARE_PROVIDER_SITE_OTHER): Payer: BC Managed Care – PPO

## 2011-05-22 DIAGNOSIS — Z23 Encounter for immunization: Secondary | ICD-10-CM

## 2011-07-08 ENCOUNTER — Ambulatory Visit (INDEPENDENT_AMBULATORY_CARE_PROVIDER_SITE_OTHER): Payer: BC Managed Care – PPO | Admitting: Family Medicine

## 2011-07-08 ENCOUNTER — Encounter: Payer: Self-pay | Admitting: Family Medicine

## 2011-07-08 VITALS — BP 148/94 | HR 105 | Temp 98.7°F | Ht 62.0 in | Wt 148.8 lb

## 2011-07-08 DIAGNOSIS — J209 Acute bronchitis, unspecified: Secondary | ICD-10-CM

## 2011-07-08 DIAGNOSIS — J40 Bronchitis, not specified as acute or chronic: Secondary | ICD-10-CM

## 2011-07-08 DIAGNOSIS — I10 Essential (primary) hypertension: Secondary | ICD-10-CM

## 2011-07-08 MED ORDER — CHLORPHENIRAMINE-HYDROCODONE 8-10 MG/5ML PO LQCR: 5.0000 mL | Freq: Two times a day (BID) | ORAL | Status: DC | PRN

## 2011-07-08 MED ORDER — CIPROFLOXACIN HCL 500 MG PO TABS: 500.0000 mg | ORAL_TABLET | Freq: Two times a day (BID) | ORAL | Status: AC

## 2011-07-08 MED ORDER — GUAIFENESIN ER 600 MG PO TB12: 600.0000 mg | ORAL_TABLET | Freq: Two times a day (BID) | ORAL | Status: DC

## 2011-07-08 NOTE — Assessment & Plan Note (Signed)
Given work note and started on Ciprofloxacin and mucinex, encouraged to increae hydration and rest. Also given Tussionex to help with cough qhs

## 2011-07-08 NOTE — Progress Notes (Signed)
[name] [id number] 22-Aug-1966 07/08/2011      Progress Note-Follow Up  Subjective  Chief Complaint  Chief Complaint  Patient presents with  . Sore Throat    X 1 week  . Nasal Congestion    X 1 week    HPI  Patient is a 45 yo female in today with a 1 week h/o sore throat, cough , nasal congestion, malaise, fevers, myalgias.  No CP/palp/SOB/GI or GU c/o. She was unable to go to work last night due to the intensity of the symptoms. She has been alternating Dayquil and Nyquil to manage the symptoms, she has not been hydrating well, has been eating her meals but her appetite is decreased.  Past Medical History  Diagnosis Date  . Hypertension   . Hyperlipidemia 12/06/2010  . Overweight 12/06/2010  . NEPHROLITHIASIS, HX OF 10/07/2010  . RESTLESS LEG SYNDROME 10/25/2010  . PERIPHERAL NEUROPATHY, FEET 10/07/2010  . HEART MURMUR, HX OF 10/07/2010  . Essential hypertension, benign 10/07/2010  . DM 10/07/2010  . Disturbance of skin sensation 10/07/2010  . COMMON MIGRAINE 10/07/2010  . Acute bronchitis 07/08/2011    History reviewed. No pertinent past surgical history.  Family History  Problem Relation Age of Onset  . Arthritis Mother   . Stroke Brother     previous smoker  . Alcohol abuse Brother     in remission  . Leukemia Brother   . Diabetes Paternal Grandmother     History   Social History  . Marital Status: Married    Spouse Name: N/A    Number of Children: N/A  . Years of Education: N/A   Occupational History  . Not on file.   Social History Main Topics  . Smoking status: Former Smoker -- 0.5 packs/day    Quit date: 09/08/2005  . Smokeless tobacco: Never Used  . Alcohol Use: No     occasional  . Drug Use: No  . Sexually Active: Yes -- Female partner(s)   Other Topics Concern  . Not on file   Social History Narrative  . No narrative on file    Current Outpatient Prescriptions on File Prior to Visit  Medication Sig Dispense Refill  . chlorthalidone  (HYGROTON) 25 MG tablet Take 1 tablet (25 mg total) by mouth daily.  30 tablet  3  . gabapentin (NEURONTIN) 300 MG capsule       . lisinopril (PRINIVIL,ZESTRIL) 10 MG tablet Take 1 tablet (10 mg total) by mouth daily.  30 tablet  11  . metFORMIN (GLUCOPHAGE) 1000 MG tablet Take 1 tablet (1,000 mg total) by mouth 2 (two) times daily with a meal.  60 tablet  3  . metFORMIN (GLUCOPHAGE) 500 MG tablet 1 tab in am po and 2 tabs in pm po  60 tablet  1  . metoprolol (TOPROL-XL) 25 MG 24 hr tablet Take 25 mg by mouth daily.        Marland Kitchen rOPINIRole (REQUIP) 0.5 MG tablet Take 0.5 mg by mouth at bedtime. 1-2 tabs         No Known Allergies  Review of Systems  Review of Systems  Constitutional: Positive for fever. Negative for malaise/fatigue.  HENT: Positive for congestion.   Eyes: Negative for discharge.  Respiratory: Positive for cough and sputum production. Negative for shortness of breath.   Cardiovascular: Negative for chest pain, palpitations and leg swelling.  Gastrointestinal: Negative for nausea, vomiting, abdominal pain, diarrhea, constipation and blood in stool.  Genitourinary: Negative for dysuria.  Musculoskeletal: Negative for falls.  Skin: Negative for rash.  Neurological: Negative for loss of consciousness and headaches.  Endo/Heme/Allergies: Negative for polydipsia.  Psychiatric/Behavioral: Negative for depression and suicidal ideas. The patient is not nervous/anxious and does not have insomnia.     Objective  BP 148/94  Pulse 105  Temp(Src) 98.7 F (37.1 C) (Oral)  Ht 5\' 2"  (1.575 m)  Wt 148 lb 12.8 oz (67.495 kg)  BMI 27.22 kg/m2  SpO2 97%  Physical Exam  Physical Exam  Constitutional: She is oriented to person, place, and time and well-developed, well-nourished, and in no distress. No distress.  HENT:  Head: Normocephalic and atraumatic.  Eyes: Conjunctivae are normal.  Neck: Neck supple. No thyromegaly present.  Cardiovascular: Normal rate, regular rhythm and  normal heart sounds.   No murmur heard. Pulmonary/Chest: Effort normal and breath sounds normal. She has no wheezes.       Decreased breath sounds b/l bases  Abdominal: She exhibits no distension and no mass.  Musculoskeletal: She exhibits no edema.  Lymphadenopathy:    She has no cervical adenopathy.  Neurological: She is alert and oriented to person, place, and time.  Skin: Skin is warm and dry. No rash noted. She is not diaphoretic.  Psychiatric: Memory, affect and judgment normal.    Lab Results  Component Value Date   TSH 2.13 10/31/2010   Lab Results  Component Value Date   WBC 6.9 10/31/2010   HGB 13.2 10/31/2010   HCT 38.4 10/31/2010   MCV 86.7 10/31/2010   PLT 303.0 10/31/2010   Lab Results  Component Value Date   CREATININE 0.6 04/11/2011   BUN 16 04/11/2011   NA 137 04/11/2011   K 4.2 04/11/2011   CL 101 04/11/2011   CO2 29 04/11/2011   Lab Results  Component Value Date   ALT 20 10/31/2010   AST 18 10/31/2010   ALKPHOS 97 10/31/2010   BILITOT 0.2* 10/31/2010   Lab Results  Component Value Date   CHOL 194 10/31/2010   Lab Results  Component Value Date   HDL 46.80 10/31/2010   Lab Results  Component Value Date   LDLCALC 121* 10/31/2010   Lab Results  Component Value Date   TRIG 132.0 10/31/2010   Lab Results  Component Value Date   CHOLHDL 4 10/31/2010     Assessment & Plan  ESSENTIAL HYPERTENSION, BENIGN BP up slightly but she has been taking Dayquil and Nyquil. She agrees to stop them and we will recheck at next visit.  Acute bronchitis Given work note and started on Ciprofloxacin and mucinex, encouraged to increae hydration and rest. Also given Tussionex to help with cough qhs

## 2011-07-08 NOTE — Patient Instructions (Signed)

## 2011-07-08 NOTE — Assessment & Plan Note (Signed)
BP up slightly but she has been taking Dayquil and Nyquil. She agrees to stop them and we will recheck at next visit.

## 2011-07-10 ENCOUNTER — Telehealth: Payer: Self-pay

## 2011-07-10 NOTE — Telephone Encounter (Signed)
Work note

## 2011-07-11 ENCOUNTER — Other Ambulatory Visit: Payer: BC Managed Care – PPO

## 2011-07-18 ENCOUNTER — Ambulatory Visit: Payer: BC Managed Care – PPO | Admitting: Family Medicine

## 2011-10-29 ENCOUNTER — Ambulatory Visit (INDEPENDENT_AMBULATORY_CARE_PROVIDER_SITE_OTHER): Payer: BC Managed Care – PPO | Admitting: Family Medicine

## 2011-10-29 ENCOUNTER — Encounter: Payer: Self-pay | Admitting: Family Medicine

## 2011-10-29 VITALS — BP 175/106 | HR 84 | Temp 97.8°F | Ht 62.0 in | Wt 148.0 lb

## 2011-10-29 DIAGNOSIS — L0390 Cellulitis, unspecified: Secondary | ICD-10-CM

## 2011-10-29 DIAGNOSIS — E119 Type 2 diabetes mellitus without complications: Secondary | ICD-10-CM

## 2011-10-29 DIAGNOSIS — L739 Follicular disorder, unspecified: Secondary | ICD-10-CM

## 2011-10-29 DIAGNOSIS — L738 Other specified follicular disorders: Secondary | ICD-10-CM

## 2011-10-29 DIAGNOSIS — I10 Essential (primary) hypertension: Secondary | ICD-10-CM

## 2011-10-29 DIAGNOSIS — L0291 Cutaneous abscess, unspecified: Secondary | ICD-10-CM

## 2011-10-29 MED ORDER — BACITRACIN 500 UNIT/GM EX OINT: 1.0000 "application " | TOPICAL_OINTMENT | Freq: Two times a day (BID) | CUTANEOUS | Status: AC

## 2011-10-29 MED ORDER — AMOXICILLIN-POT CLAVULANATE 875-125 MG PO TABS: 1.0000 | ORAL_TABLET | Freq: Two times a day (BID) | ORAL | Status: AC

## 2011-10-29 NOTE — Patient Instructions (Signed)

## 2011-11-02 ENCOUNTER — Encounter: Payer: Self-pay | Admitting: Family Medicine

## 2011-11-02 DIAGNOSIS — L739 Follicular disorder, unspecified: Secondary | ICD-10-CM | POA: Insufficient documentation

## 2011-11-02 NOTE — Assessment & Plan Note (Signed)
Patient agrees to return for fasting labs, minimize simple carbs

## 2011-11-02 NOTE — Assessment & Plan Note (Signed)
Has not taken her medications today. Recheck at next visit, minimize sodium in diet.

## 2011-11-02 NOTE — Progress Notes (Signed)
[name], female   DOB: [birth year], 46 y.o.   MRN: [id number] [name] [id number] [birth year] 11/02/2011      Progress Note-Follow Up  Subjective  Chief Complaint  Chief Complaint  Patient presents with  . spider bite    Left leg-bite from last December- burning, stinging, red and irritated    HPI  Patient is a 46 patient female who is here today to have a painful lesion on her leg evaluated. She's been seen by urgent care several times and placed on antibiotics. Warmth in the lesion has improved, no discharge at this point but it is tender to the touch and persistent. No fevers, chills, cp, palp, sob, gi or gu c/o. No increase in blood sugars recently. No malaise, myalgias, anorexia. Has been under a great deal of stress lately  Past Medical History  Diagnosis Date  . Hypertension   . Hyperlipidemia 12/06/2010  . Overweight 12/06/2010  . NEPHROLITHIASIS, HX OF 10/07/2010  . RESTLESS LEG SYNDROME 10/25/2010  . PERIPHERAL NEUROPATHY, FEET 10/07/2010  . HEART MURMUR, HX OF 10/07/2010  . Essential hypertension, benign 10/07/2010  . DM 10/07/2010  . Disturbance of skin sensation 10/07/2010  . COMMON MIGRAINE 10/07/2010  . Acute bronchitis 07/08/2011    History reviewed. No pertinent past surgical history.  Family History  Problem Relation Age of Onset  . Arthritis Mother   . Stroke Brother     previous smoker  . Alcohol abuse Brother     in remission  . Leukemia Brother   . Diabetes Paternal Grandmother     History   Social History  . Marital Status: Married    Spouse Name: N/A    Number of Children: N/A  . Years of Education: N/A   Occupational History  . Not on file.   Social History Main Topics  . Smoking status: Former Smoker -- 0.5 packs/day    Quit date: 09/08/2005  . Smokeless tobacco: Never Used  . Alcohol Use: No     occasional  . Drug Use: No  . Sexually Active: Yes -- Female partner(s)   Other Topics Concern  . Not on file    Social History Narrative  . No narrative on file    Current Outpatient Prescriptions on File Prior to Visit  Medication Sig Dispense Refill  . chlorthalidone (HYGROTON) 25 MG tablet Take 1 tablet (25 mg total) by mouth daily.  30 tablet  3  . DM-Doxylamine-Acetaminophen (NYQUIL COLD & FLU PO) Take by mouth as directed.        . gabapentin (NEURONTIN) 300 MG capsule       . lisinopril (PRINIVIL,ZESTRIL) 10 MG tablet Take 1 tablet (10 mg total) by mouth daily.  30 tablet  11  . metFORMIN (GLUCOPHAGE) 1000 MG tablet Take 1 tablet (1,000 mg total) by mouth 2 (two) times daily with a meal.  60 tablet  3  . metoprolol (TOPROL-XL) 25 MG 24 hr tablet Take 25 mg by mouth daily.          No Known Allergies  Review of Systems  Review of Systems  Constitutional: Negative for fever and malaise/fatigue.  HENT: Negative for congestion.   Eyes: Negative for discharge.  Respiratory: Negative for shortness of breath.   Cardiovascular: Negative for chest pain, palpitations and leg swelling.  Gastrointestinal: Negative for nausea, abdominal pain and diarrhea.  Genitourinary: Negative for dysuria.  Musculoskeletal: Negative for falls.  Skin: Negative for rash.  Lesion on lower leg for over a month  Neurological: Negative for loss of consciousness and headaches.  Endo/Heme/Allergies: Negative for polydipsia.  Psychiatric/Behavioral: Negative for depression and suicidal ideas. The patient is not nervous/anxious and does not have insomnia.     Objective  BP 175/106  Pulse 84  Temp(Src) 97.8 F (36.6 C) (Temporal)  Ht 5\' 2"  (1.575 m)  Wt 148 lb (67.132 kg)  BMI 27.07 kg/m2  SpO2 98%  Physical Exam  Physical Exam  Constitutional: She is oriented to person, place, and time and well-developed, well-nourished, and in no distress. No distress.  HENT:  Head: Normocephalic and atraumatic.  Eyes: Conjunctivae are normal.  Neck: Neck supple. No thyromegaly present.  Cardiovascular: Normal  rate, regular rhythm and normal heart sounds.   No murmur heard. Pulmonary/Chest: Effort normal and breath sounds normal. She has no wheezes.  Abdominal: She exhibits no distension and no mass.  Musculoskeletal: She exhibits no edema.  Lymphadenopathy:    She has no cervical adenopathy.  Neurological: She is alert and oriented to person, place, and time.  Skin: Skin is warm and dry. No rash noted. She is not diaphoretic. There is erythema.       1 inch eschar surrounded by erythema an granulation tissue on lower, lateral leg above malleolus  Psychiatric: Memory, affect and judgment normal.    Lab Results  Component Value Date   TSH 2.13 10/31/2010   Lab Results  Component Value Date   WBC 6.9 10/31/2010   HGB 13.2 10/31/2010   HCT 38.4 10/31/2010   MCV 86.7 10/31/2010   PLT 303.0 10/31/2010   Lab Results  Component Value Date   CREATININE 0.6 04/11/2011   BUN 16 04/11/2011   NA 137 04/11/2011   K 4.2 04/11/2011   CL 101 04/11/2011   CO2 29 04/11/2011   Lab Results  Component Value Date   ALT 20 10/31/2010   AST 18 10/31/2010   ALKPHOS 97 10/31/2010   BILITOT 0.2* 10/31/2010   Lab Results  Component Value Date   CHOL 194 10/31/2010   Lab Results  Component Value Date   HDL 46.80 10/31/2010   Lab Results  Component Value Date   LDLCALC 121* 10/31/2010   Lab Results  Component Value Date   TRIG 132.0 10/31/2010   Lab Results  Component Value Date   CHOLHDL 4 10/31/2010     Assessment & Plan  ESSENTIAL HYPERTENSION, BENIGN Has not taken her medications today. Recheck at next visit, minimize sodium in diet.  Folliculitis Has already been treated with antibiotics but now has a tender Eschar over the area. Is placed on an oral antibiotic and Bactroban ointment she will notify us if no improvment  DM Patient agrees to return for fasting labs, minimize simple carbs

## 2011-11-02 NOTE — Assessment & Plan Note (Signed)
Has already been treated with antibiotics but now has a tender Eschar over the area. Is placed on an oral antibiotic and Bactroban ointment she will notify us if no improvment

## 2011-11-25 ENCOUNTER — Encounter: Payer: Self-pay | Admitting: Family Medicine

## 2011-11-25 ENCOUNTER — Ambulatory Visit (INDEPENDENT_AMBULATORY_CARE_PROVIDER_SITE_OTHER): Payer: BC Managed Care – PPO | Admitting: Family Medicine

## 2011-11-25 VITALS — BP 132/92 | HR 85 | Temp 98.2°F | Ht 62.0 in | Wt 147.0 lb

## 2011-11-25 DIAGNOSIS — L738 Other specified follicular disorders: Secondary | ICD-10-CM

## 2011-11-25 DIAGNOSIS — I739 Peripheral vascular disease, unspecified: Secondary | ICD-10-CM

## 2011-11-25 DIAGNOSIS — I10 Essential (primary) hypertension: Secondary | ICD-10-CM

## 2011-11-25 DIAGNOSIS — E119 Type 2 diabetes mellitus without complications: Secondary | ICD-10-CM

## 2011-11-25 DIAGNOSIS — J209 Acute bronchitis, unspecified: Secondary | ICD-10-CM

## 2011-11-25 DIAGNOSIS — L739 Follicular disorder, unspecified: Secondary | ICD-10-CM

## 2011-11-25 DIAGNOSIS — G609 Hereditary and idiopathic neuropathy, unspecified: Secondary | ICD-10-CM

## 2011-11-25 DIAGNOSIS — G629 Polyneuropathy, unspecified: Secondary | ICD-10-CM

## 2011-11-25 MED ORDER — ASPIRIN 81 MG PO TABS: 81.0000 mg | ORAL_TABLET | Freq: Every day | ORAL | Status: DC

## 2011-11-25 MED ORDER — KRILL OIL PO CAPS: 1.0000 | ORAL_CAPSULE | Freq: Every day | ORAL | Status: DC

## 2011-11-25 MED ORDER — GABAPENTIN 100 MG PO CAPS: ORAL_CAPSULE | ORAL | Status: DC

## 2011-11-25 NOTE — Patient Instructions (Signed)
Raynaud's Syndrome Raynaud's Syndrome is a disorder of the blood vessels in your hands and feet. It occurs when small arteries of the arms/hands or legs/feet become sensitive to cold or emotional upset. This causes the arteries to constrict, or narrow, and reduces blood flow to the area. The color in the fingers or toes changes from white to bluish to red and this is not usually painful. There may be numbness and tingling. Sores on the skin (ulcers) can form. Symptoms are usually relieved by warming. HOME CARE INSTRUCTIONS   Avoid exposure to cold. Keep your whole body warm and dry. Dress in layers. Wear mittens or gloves when handling ice or frozen food and when outdoors. Use holders for glasses or cans containing cold drinks. If possible, stay indoors during cold weather.   Limit your use of caffeine. Switch to decaffeinated coffee, tea, and soda pop. Avoid chocolate.   Avoid smoking or being around cigarette smoke. Smoke will make symptoms worse.   Wear loose fitting socks and comfortable, roomy shoes.   Avoid vibrating tools and machinery.   If possible, avoid stressful and emotional situations. Exercise, meditation and yoga may help you cope with stress. Biofeedback may be useful.   Ask your caregiver about medicine (calcium channel blockers) that may control Raynaud's phenomena.  SEEK MEDICAL CARE IF:   Your discomfort becomes worse, despite conservative treatment.   You develop sores on your fingers and toes that do not heal.  Document Released: 08/22/2000 Document Revised: 08/14/2011 Document Reviewed: 08/29/2008 [facility] Patient Information 2012 [place], [place].   Peripheral Neuropathy Peripheral neuropathy is a common disorder of your nerves resulting from damage. CAUSES  This disorder may be caused by a disease of the nerves or illness. Many neuropathies have well known causes such as:  Diabetes. This is one of the most common causes.   Uremia.   AIDS.   Nutritional  deficiencies.   Other causes include mechanical pressures. These may be from:   Compression.   Injury.   Contusions or bruises.   Fracture or dislocated bones.   Pressure involving the nerves close to the surface. Nerves such as the ulnar, or radial can be injured by prolonged use of crutches.  Other injuries may come from:  Tumor.   Hemorrhage or bleeding into a nerve.   Exposure to cold or radiation.   Certain medicines or toxic substances (rare).   Vascular or collagen disorders such as:   Atherosclerosis.   Systemic lupus erythematosus.   Scleroderma.   Sarcoidosis.   Rheumatoid arthritis.   Polyarteritis nodosa.   A large number of cases are of unknown cause.  SYMPTOMS  Common problems include:  Weakness.   Numbness.   Abnormal sensations (paresthesia) such as:   Burning.   Tickling.   Pricking.   Tingling.   Pain in the arms, hands, legs and/or feet.  TREATMENT  Therapy for this disorder differs depending on the cause. It may vary from medical treatment with medications or physical therapy among others.   For example, therapy for this disorder caused by diabetes involves control of the diabetes.   In cases where a tumor or ruptured disc is the cause, therapy may involve surgery. This would be to remove the tumor or to repair the ruptured disc.   In entrapment or compression neuropathy, treatment may consist of splinting or surgical decompression of the ulnar or median nerves. A common example of entrapment neuropathy is carpal tunnel syndrome. This has become more common because of the  increasing use of computers.   Peroneal and radial compression neuropathies may require avoidance of pressure.   Physical therapy and/or splints may be useful in preventing contractures. This is a condition in which shortened muscles around joints cause abnormal and sometimes painful positioning of the joints.  Document Released: 08/15/2002 Document Revised:  05/07/2011 Document Reviewed: 08/25/2005 [facility] Patient Information 2012 [place], [place]. Start MegaRed krill oil caps 1 cap daily by Schiff and start a 81 mg aspirin daily

## 2011-11-27 NOTE — Assessment & Plan Note (Signed)
Mild elevation, encouraged to avoid sodium and we will continue to monitor

## 2011-11-27 NOTE — Assessment & Plan Note (Signed)
Start Gabapentin 100 to 300 mg qhs as directed

## 2011-11-27 NOTE — Progress Notes (Signed)
[name], female   DOB: [birth year], 46 y.o.   MRN: [id number] [name] [id number] 07/02/1966 11/27/2011      Progress Note-Follow Up  Subjective  Chief Complaint  Chief Complaint  Patient presents with  . Follow-up    spider bite on left leg    HPI  Patient is a 46 year old Caucasian female in today for reevaluation of a persistent lesion on her lower leg. She says it is still painful but improving. It is slowly smaller. She's had no fevers, chills, malaise or myalgias. No discharge. She continues to be under great distress but does feel she's handling it relatively well. Her sugars are well controlled. No other recent illness, fevers, chills, chest pain, palpitations, shortness of breath  Past Medical History  Diagnosis Date  . Hypertension   . Hyperlipidemia 12/06/2010  . Overweight 12/06/2010  . NEPHROLITHIASIS, HX OF 10/07/2010  . RESTLESS LEG SYNDROME 10/25/2010  . PERIPHERAL NEUROPATHY, FEET 10/07/2010  . HEART MURMUR, HX OF 10/07/2010  . Essential hypertension, benign 10/07/2010  . DM 10/07/2010  . Disturbance of skin sensation 10/07/2010  . COMMON MIGRAINE 10/07/2010  . Acute bronchitis 07/08/2011    No past surgical history on file.  Family History  Problem Relation Age of Onset  . Arthritis Mother   . Stroke Brother     previous smoker  . Alcohol abuse Brother     in remission  . Leukemia Brother   . Diabetes Paternal Grandmother     History   Social History  . Marital Status: Married    Spouse Name: N/A    Number of Children: N/A  . Years of Education: N/A   Occupational History  . Not on file.   Social History Main Topics  . Smoking status: Former Smoker -- 0.5 packs/day    Quit date: 09/08/2005  . Smokeless tobacco: Never Used  . Alcohol Use: No     occasional  . Drug Use: No  . Sexually Active: Yes -- Female partner(s)   Other Topics Concern  . Not on file   Social History Narrative  . No narrative on file     Current Outpatient Prescriptions on File Prior to Visit  Medication Sig Dispense Refill  . chlorthalidone (HYGROTON) 25 MG tablet Take 1 tablet (25 mg total) by mouth daily.  30 tablet  3  . lisinopril (PRINIVIL,ZESTRIL) 10 MG tablet Take 1 tablet (10 mg total) by mouth daily.  30 tablet  11  . metFORMIN (GLUCOPHAGE) 1000 MG tablet Take 1 tablet (1,000 mg total) by mouth 2 (two) times daily with a meal.  60 tablet  3  . metoprolol (TOPROL-XL) 25 MG 24 hr tablet Take 25 mg by mouth daily.          No Known Allergies  Review of Systems  Review of Systems  Constitutional: Negative for fever and malaise/fatigue.  HENT: Negative for congestion.   Eyes: Negative for discharge.  Respiratory: Negative for shortness of breath.   Cardiovascular: Negative for chest pain, palpitations and leg swelling.  Gastrointestinal: Negative for nausea, abdominal pain and diarrhea.  Genitourinary: Negative for dysuria.  Musculoskeletal: Negative for falls.  Skin: Positive for rash.  Neurological: Negative for loss of consciousness and headaches.  Endo/Heme/Allergies: Negative for polydipsia.  Psychiatric/Behavioral: Negative for depression and suicidal ideas. The patient is nervous/anxious. The patient does not have insomnia.     Objective  BP 132/92  Pulse 85  Temp(Src) 98.2 F (36.8 C) (Temporal)  Ht 5\' 2"  (1.575 m)  Wt 147 lb (66.679 kg)  BMI 26.89 kg/m2  SpO2 99%  Physical Exam  Physical Exam  Constitutional: She is oriented to person, place, and time and well-developed, well-nourished, and in no distress. No distress.  HENT:  Head: Normocephalic and atraumatic.  Eyes: Conjunctivae are normal.  Neck: Neck supple. No thyromegaly present.  Cardiovascular: Normal rate, regular rhythm and normal heart sounds.   No murmur heard. Pulmonary/Chest: Effort normal and breath sounds normal. She has no wheezes.  Abdominal: She exhibits no distension and no mass.  Musculoskeletal: She exhibits  no edema.  Lymphadenopathy:    She has no cervical adenopathy.  Neurological: She is alert and oriented to person, place, and time.  Skin: Skin is warm and dry. No rash noted. She is not diaphoretic.       1 cm eschar in center of lesion in lower left leg, granulation tissue surrounding  Psychiatric: Memory, affect and judgment normal.    Lab Results  Component Value Date   TSH 2.13 10/31/2010   Lab Results  Component Value Date   WBC 6.9 10/31/2010   HGB 13.2 10/31/2010   HCT 38.4 10/31/2010   MCV 86.7 10/31/2010   PLT 303.0 10/31/2010   Lab Results  Component Value Date   CREATININE 0.6 04/11/2011   BUN 16 04/11/2011   NA 137 04/11/2011   K 4.2 04/11/2011   CL 101 04/11/2011   CO2 29 04/11/2011   Lab Results  Component Value Date   ALT 20 10/31/2010   AST 18 10/31/2010   ALKPHOS 97 10/31/2010   BILITOT 0.2* 10/31/2010   Lab Results  Component Value Date   CHOL 194 10/31/2010   Lab Results  Component Value Date   HDL 46.80 10/31/2010   Lab Results  Component Value Date   LDLCALC 121* 10/31/2010   Lab Results  Component Value Date   TRIG 132.0 10/31/2010   Lab Results  Component Value Date   CHOLHDL 4 10/31/2010     Assessment & Plan  ESSENTIAL HYPERTENSION, BENIGN Mild elevation, encouraged to avoid sodium and we will continue to monitor  Acute bronchitis resolved  Folliculitis Lower leg. Lesion is shrinking, no surrounding erythema or fluctuance, granulation tissue at edge and eschar in center. Encouraged to cleanse it today with mild soap and water then peroxide. Apply antibiotic ointment and cover when she has to dress. Call if symptoms worsen  DM Adequately controlled  PERIPHERAL NEUROPATHY, FEET Start Gabapentin 100 to 300 mg qhs as directed

## 2011-11-27 NOTE — Assessment & Plan Note (Signed)
resolved 

## 2011-11-27 NOTE — Assessment & Plan Note (Signed)
Adequately controlled 

## 2011-11-27 NOTE — Assessment & Plan Note (Signed)
Lower leg. Lesion is shrinking, no surrounding erythema or fluctuance, granulation tissue at edge and eschar in center. Encouraged to cleanse it today with mild soap and water then peroxide. Apply antibiotic ointment and cover when she has to dress. Call if symptoms worsen

## 2011-12-22 ENCOUNTER — Other Ambulatory Visit (INDEPENDENT_AMBULATORY_CARE_PROVIDER_SITE_OTHER): Payer: BC Managed Care – PPO

## 2011-12-22 ENCOUNTER — Ambulatory Visit (INDEPENDENT_AMBULATORY_CARE_PROVIDER_SITE_OTHER): Payer: BC Managed Care – PPO | Admitting: Family Medicine

## 2011-12-22 ENCOUNTER — Encounter: Payer: Self-pay | Admitting: Family Medicine

## 2011-12-22 VITALS — BP 145/92 | HR 79 | Temp 98.4°F | Ht 62.0 in | Wt 146.0 lb

## 2011-12-22 DIAGNOSIS — E11622 Type 2 diabetes mellitus with other skin ulcer: Secondary | ICD-10-CM

## 2011-12-22 DIAGNOSIS — S91009A Unspecified open wound, unspecified ankle, initial encounter: Secondary | ICD-10-CM

## 2011-12-22 DIAGNOSIS — I10 Essential (primary) hypertension: Secondary | ICD-10-CM

## 2011-12-22 DIAGNOSIS — E119 Type 2 diabetes mellitus without complications: Secondary | ICD-10-CM

## 2011-12-22 DIAGNOSIS — G629 Polyneuropathy, unspecified: Secondary | ICD-10-CM

## 2011-12-22 DIAGNOSIS — L97909 Non-pressure chronic ulcer of unspecified part of unspecified lower leg with unspecified severity: Secondary | ICD-10-CM

## 2011-12-22 DIAGNOSIS — S81802A Unspecified open wound, left lower leg, initial encounter: Secondary | ICD-10-CM

## 2011-12-22 DIAGNOSIS — S81009A Unspecified open wound, unspecified knee, initial encounter: Secondary | ICD-10-CM

## 2011-12-22 DIAGNOSIS — E1169 Type 2 diabetes mellitus with other specified complication: Secondary | ICD-10-CM

## 2011-12-22 DIAGNOSIS — J209 Acute bronchitis, unspecified: Secondary | ICD-10-CM

## 2011-12-22 DIAGNOSIS — G609 Hereditary and idiopathic neuropathy, unspecified: Secondary | ICD-10-CM

## 2011-12-22 LAB — LIPID PANEL
Cholesterol: 188 mg/dL (ref 0–200)
HDL: 44.1 mg/dL (ref >39.00)
LDL Cholesterol: 131 mg/dL — ABNORMAL HIGH (ref 0–99)
Total CHOL/HDL Ratio: 4
Triglycerides: 66 mg/dL (ref 0.0–149.0)
VLDL: 13.2 mg/dL (ref 0.0–40.0)

## 2011-12-22 LAB — RENAL FUNCTION PANEL
Albumin: 3.7 g/dL (ref 3.5–5.2)
BUN: 18 mg/dL (ref 6–23)
CO2: 27 mEq/L (ref 19–32)
Calcium: 9.7 mg/dL (ref 8.4–10.5)
Chloride: 102 mEq/L (ref 96–112)
Creatinine, Ser: 0.5 mg/dL (ref 0.4–1.2)
GFR: 143.25 mL/min (ref >60.00)
Glucose, Bld: 309 mg/dL — ABNORMAL HIGH (ref 70–99)
Phosphorus: 4.2 mg/dL (ref 2.3–4.6)
Potassium: 4.9 mEq/L (ref 3.5–5.1)
Sodium: 138 mEq/L (ref 135–145)

## 2011-12-22 LAB — HEPATIC FUNCTION PANEL
ALT: 26 U/L (ref 0–35)
AST: 20 U/L (ref 0–37)
Albumin: 3.7 g/dL (ref 3.5–5.2)
Alkaline Phosphatase: 98 U/L (ref 39–117)
Bilirubin, Direct: 0.1 mg/dL (ref 0.0–0.3)
Total Bilirubin: 0.5 mg/dL (ref 0.3–1.2)
Total Protein: 6.9 g/dL (ref 6.0–8.3)

## 2011-12-22 LAB — HEMOGLOBIN A1C: Hgb A1c MFr Bld: 13.2 % — ABNORMAL HIGH (ref 4.6–6.5)

## 2011-12-22 LAB — CBC WITH DIFFERENTIAL/PLATELET
Basophils Absolute: 0 10*3/uL (ref 0.0–0.1)
Basophils Relative: 0.6 % (ref 0.0–3.0)
Eosinophils Absolute: 0.1 10*3/uL (ref 0.0–0.7)
Eosinophils Relative: 2 % (ref 0.0–5.0)
HCT: 43.2 % (ref 36.0–46.0)
Hemoglobin: 14 g/dL (ref 12.0–15.0)
Lymphocytes Relative: 23.4 % (ref 12.0–46.0)
Lymphs Abs: 1.6 10*3/uL (ref 0.7–4.0)
MCHC: 32.5 g/dL (ref 30.0–36.0)
MCV: 89.5 fl (ref 78.0–100.0)
Monocytes Absolute: 0.6 10*3/uL (ref 0.1–1.0)
Monocytes Relative: 9.5 % (ref 3.0–12.0)
Neutro Abs: 4.3 10*3/uL (ref 1.4–7.7)
Neutrophils Relative %: 64.5 % (ref 43.0–77.0)
Platelets: 347 10*3/uL (ref 150.0–400.0)
RBC: 4.82 Mil/uL (ref 3.87–5.11)
RDW: 13.4 % (ref 11.5–14.6)
WBC: 6.7 10*3/uL (ref 4.5–10.5)

## 2011-12-22 LAB — MICROALBUMIN / CREATININE URINE RATIO
Creatinine,U: 57.8 mg/dL
Microalb Creat Ratio: 1.9 mg/g (ref 0.0–30.0)
Microalb, Ur: 1.1 mg/dL (ref 0.0–1.9)

## 2011-12-22 LAB — TSH: TSH: 2.01 u[IU]/mL (ref 0.35–5.50)

## 2011-12-22 MED ORDER — AMOXICILLIN-POT CLAVULANATE 875-125 MG PO TABS: 1.0000 | ORAL_TABLET | Freq: Two times a day (BID) | ORAL | Status: AC

## 2011-12-22 MED ORDER — GABAPENTIN 100 MG PO CAPS: ORAL_CAPSULE | ORAL | Status: DC

## 2011-12-22 MED ORDER — ALIGN PO CAPS: 1.0000 | ORAL_CAPSULE | Freq: Every day | ORAL | Status: AC

## 2011-12-22 NOTE — Assessment & Plan Note (Signed)
Resolved with treatment 

## 2011-12-22 NOTE — Assessment & Plan Note (Signed)
It has been present for several months. It begins to respond to treatment and then worsens again. At this point she denies fevers, chills or spreading of the pain and discomfort have become too much for her. She has been offered a referral to wound management before and has declined but agrees today. Is given a repeat course of Augmentin twice a day and is to continue current care and start a probiotic

## 2011-12-22 NOTE — Assessment & Plan Note (Signed)
Mild elevation will monitor at next visit

## 2011-12-22 NOTE — Assessment & Plan Note (Signed)
Will increase Gabapentin to 100mg  bid and to 200mg  qhs and see if she tolerates this and gets some relief

## 2011-12-22 NOTE — Progress Notes (Signed)
[name], female   DOB: [birth year], 46 y.o.   MRN: [id number] [name] [id number] [birth year] 12/22/2011      Progress Note-Follow Up  Subjective  Chief Complaint  Chief Complaint  Patient presents with  . Leg Pain    wants a referral- spider bite not any better- left leg    HPI  Patient is a 46 year old Caucasian female who is in today for evaluation of her persistent left lower extremity lesion. She has been to urgent care multiple times and then here her course of antibiotics pain meds and treatments the lesion on her left lower extremity process. She has been offered a referral to one mentioned before and previously has declined. Today agrees to pursue this. She's not had any fevers, chills, malaise, myalgias or signs of systemic illness but she has persistent pain and open sore in her left leg. She reports her blood sugars are doing well. She should otherwise her major complaint is the numbness and discomfort she continues to feel in her feet. Gabapentin helps her rest on the night but has not slowed the sensation of numbness or tingling in her feet. This has been progressive over many years but recently has gotten worse to the point were she is willing to try medications during the day  Past Medical History  Diagnosis Date  . Hypertension   . Hyperlipidemia 12/06/2010  . Overweight 12/06/2010  . NEPHROLITHIASIS, HX OF 10/07/2010  . RESTLESS LEG SYNDROME 10/25/2010  . PERIPHERAL NEUROPATHY, FEET 10/07/2010  . HEART MURMUR, HX OF 10/07/2010  . Essential hypertension, benign 10/07/2010  . DM 10/07/2010  . Disturbance of skin sensation 10/07/2010  . COMMON MIGRAINE 10/07/2010  . Acute bronchitis 07/08/2011    History reviewed. No pertinent past surgical history.  Family History  Problem Relation Age of Onset  . Arthritis Mother   . Stroke Brother     previous smoker  . Alcohol abuse Brother     in remission  . Leukemia Brother   . Diabetes Paternal  Grandmother     History   Social History  . Marital Status: Married    Spouse Name: N/A    Number of Children: N/A  . Years of Education: N/A   Occupational History  . Not on file.   Social History Main Topics  . Smoking status: Former Smoker -- 0.5 packs/day    Quit date: 09/08/2005  . Smokeless tobacco: Never Used  . Alcohol Use: No     occasional  . Drug Use: No  . Sexually Active: Yes -- Female partner(s)   Other Topics Concern  . Not on file   Social History Narrative  . No narrative on file    Current Outpatient Prescriptions on File Prior to Visit  Medication Sig Dispense Refill  . aspirin 81 MG tablet Take 1 tablet (81 mg total) by mouth daily.  30 tablet    . chlorthalidone (HYGROTON) 25 MG tablet Take 1 tablet (25 mg total) by mouth daily.  30 tablet  3  . Krill Oil CAPS Take 1 capsule by mouth daily.  30 capsule    . lisinopril (PRINIVIL,ZESTRIL) 10 MG tablet Take 1 tablet (10 mg total) by mouth daily.  30 tablet  11  . metFORMIN (GLUCOPHAGE) 1000 MG tablet Take 1 tablet (1,000 mg total) by mouth 2 (two) times daily with a meal.  60 tablet  3  . metoprolol (TOPROL-XL) 25 MG 24 hr tablet Take 25 mg by mouth  daily.        . naproxen sodium (ALEVE) 220 MG tablet Take 220 mg by mouth as needed.      Marland Kitchen DISCONTD: gabapentin (NEURONTIN) 100 MG capsule 1 to 3 tabs po qhs as directed  90 capsule  1    No Known Allergies  Review of Systems  Review of Systems  Constitutional: Negative for fever, chills and malaise/fatigue.  HENT: Negative for congestion.   Eyes: Negative for discharge.  Respiratory: Negative for shortness of breath.   Cardiovascular: Negative for chest pain, palpitations and leg swelling.  Gastrointestinal: Negative for nausea, abdominal pain and diarrhea.  Genitourinary: Negative for dysuria.  Musculoskeletal: Negative for falls.  Skin: Negative for rash.       Lesion lateral left calf, painful and open  Neurological: Positive for tingling and  sensory change. Negative for loss of consciousness and headaches.       Progressively worsening numbness, discomfort in b/l feet  Endo/Heme/Allergies: Negative for polydipsia.  Psychiatric/Behavioral: Negative for depression and suicidal ideas. The patient is not nervous/anxious and does not have insomnia.     Objective  BP 145/92  Pulse 79  Temp(Src) 98.4 F (36.9 C) (Temporal)  Ht 5\' 2"  (1.575 m)  Wt 146 lb (66.225 kg)  BMI 26.70 kg/m2  SpO2 97%  Physical Exam  Physical Exam  Constitutional: She is oriented to person, place, and time and well-developed, well-nourished, and in no distress. No distress.  HENT:  Head: Normocephalic and atraumatic.  Eyes: Conjunctivae are normal.  Neck: Neck supple. No thyromegaly present.  Cardiovascular: Normal rate, regular rhythm and normal heart sounds.   No murmur heard. Pulmonary/Chest: Effort normal and breath sounds normal. She has no wheezes.  Abdominal: She exhibits no distension and no mass.  Musculoskeletal: She exhibits no edema.  Lymphadenopathy:    She has no cervical adenopathy.  Neurological: She is alert and oriented to person, place, and time.  Skin: Skin is warm and dry. No rash noted. She is not diaphoretic. There is erythema.       Lesion lower lateral, left calf, granulation tissue around with eschar in center and slight yellowish discharge  Psychiatric: Memory, affect and judgment normal.    Lab Results  Component Value Date   TSH 2.13 10/31/2010   Lab Results  Component Value Date   WBC 6.9 10/31/2010   HGB 13.2 10/31/2010   HCT 38.4 10/31/2010   MCV 86.7 10/31/2010   PLT 303.0 10/31/2010   Lab Results  Component Value Date   CREATININE 0.6 04/11/2011   BUN 16 04/11/2011   NA 137 04/11/2011   K 4.2 04/11/2011   CL 101 04/11/2011   CO2 29 04/11/2011   Lab Results  Component Value Date   ALT 20 10/31/2010   AST 18 10/31/2010   ALKPHOS 97 10/31/2010   BILITOT 0.2* 10/31/2010   Lab Results  Component Value Date   CHOL  194 10/31/2010   Lab Results  Component Value Date   HDL 46.80 10/31/2010   Lab Results  Component Value Date   LDLCALC 121* 10/31/2010   Lab Results  Component Value Date   TRIG 132.0 10/31/2010   Lab Results  Component Value Date   CHOLHDL 4 10/31/2010     Assessment & Plan  Diabetic ulcer of lower extremity It has been present for several months. It begins to respond to treatment and then worsens again. At this point she denies fevers, chills or spreading of the pain and discomfort  have become too much for her. She has been offered a referral to wound management before and has declined but agrees today. Is given a repeat course of Augmentin twice a day and is to continue current care and start a probiotic  ESSENTIAL HYPERTENSION, BENIGN Mild elevation will monitor at next visit  Acute bronchitis Resolved with treatment  PERIPHERAL NEUROPATHY, FEET Will increase Gabapentin to 100mg  bid and to 200mg  qhs and see if she tolerates this and gets some relief

## 2011-12-22 NOTE — Patient Instructions (Signed)
Peripheral Neuropathy Peripheral neuropathy is a common disorder of your nerves resulting from damage. CAUSES  This disorder may be caused by a disease of the nerves or illness. Many neuropathies have well known causes such as:  Diabetes. This is one of the most common causes.   Uremia.   AIDS.   Nutritional deficiencies.   Other causes include mechanical pressures. These may be from:   Compression.   Injury.   Contusions or bruises.   Fracture or dislocated bones.   Pressure involving the nerves close to the surface. Nerves such as the ulnar, or radial can be injured by prolonged use of crutches.  Other injuries may come from:  Tumor.   Hemorrhage or bleeding into a nerve.   Exposure to cold or radiation.   Certain medicines or toxic substances (rare).   Vascular or collagen disorders such as:   Atherosclerosis.   Systemic lupus erythematosus.   Scleroderma.   Sarcoidosis.   Rheumatoid arthritis.   Polyarteritis nodosa.   A large number of cases are of unknown cause.  SYMPTOMS  Common problems include:  Weakness.   Numbness.   Abnormal sensations (paresthesia) such as:   Burning.   Tickling.   Pricking.   Tingling.   Pain in the arms, hands, legs and/or feet.  TREATMENT  Therapy for this disorder differs depending on the cause. It may vary from medical treatment with medications or physical therapy among others.   For example, therapy for this disorder caused by diabetes involves control of the diabetes.   In cases where a tumor or ruptured disc is the cause, therapy may involve surgery. This would be to remove the tumor or to repair the ruptured disc.   In entrapment or compression neuropathy, treatment may consist of splinting or surgical decompression of the ulnar or median nerves. A common example of entrapment neuropathy is carpal tunnel syndrome. This has become more common because of the increasing use of computers.   Peroneal and  radial compression neuropathies may require avoidance of pressure.   Physical therapy and/or splints may be useful in preventing contractures. This is a condition in which shortened muscles around joints cause abnormal and sometimes painful positioning of the joints.  Document Released: 08/15/2002 Document Revised: 05/07/2011 Document Reviewed: 08/25/2005 [employer] Patient Information 2012 [employer]. 

## 2011-12-29 ENCOUNTER — Ambulatory Visit: Payer: BC Managed Care – PPO | Admitting: Family Medicine

## 2011-12-29 ENCOUNTER — Encounter (HOSPITAL_BASED_OUTPATIENT_CLINIC_OR_DEPARTMENT_OTHER): Payer: BC Managed Care – PPO | Attending: Internal Medicine

## 2011-12-29 DIAGNOSIS — Z79899 Other long term (current) drug therapy: Secondary | ICD-10-CM | POA: Insufficient documentation

## 2011-12-29 DIAGNOSIS — E1142 Type 2 diabetes mellitus with diabetic polyneuropathy: Secondary | ICD-10-CM | POA: Insufficient documentation

## 2011-12-29 DIAGNOSIS — T63391A Toxic effect of venom of other spider, accidental (unintentional), initial encounter: Secondary | ICD-10-CM | POA: Insufficient documentation

## 2011-12-29 DIAGNOSIS — E1149 Type 2 diabetes mellitus with other diabetic neurological complication: Secondary | ICD-10-CM | POA: Insufficient documentation

## 2011-12-29 DIAGNOSIS — L97809 Non-pressure chronic ulcer of other part of unspecified lower leg with unspecified severity: Secondary | ICD-10-CM | POA: Insufficient documentation

## 2011-12-29 LAB — GLUCOSE, CAPILLARY: Glucose-Capillary: 347 mg/dL — ABNORMAL HIGH (ref 70–99)

## 2011-12-30 ENCOUNTER — Telehealth: Payer: Self-pay | Admitting: Family Medicine

## 2011-12-30 MED ORDER — ONETOUCH LANCETS MISC: Status: DC

## 2011-12-30 NOTE — Progress Notes (Signed)
[facility]  [name], [name]             ACCOUNT NO.:  [id number]  MEDICAL RECORD NO.:  [id number]      DATE OF BIRTH:  18-May-1966  PHYSICIAN:  Jonelle Sports. Daiquan Resnik, M.D.  VISIT DATE:  12/29/2011                                  OFFICE VISIT   HISTORY:  This 46 year old white female with type 2 diabetes, is seen for evaluation of a wound on the lateral aspect of her distal left lower extremity.  The patient has diabetic neuropathy and several other medical conditions but generally along with her diabetes but has generally been rather stable until December 2012, while working in the yard, she sustained what she thought perhaps was a spider bite on the distal lateral aspect of her left lower extremity.  This, she ignored until it failed improvement, became inflamed, and developed soft eschar.  She saw her physician and was given antibiotics and topical ointment to put on this but failed to improve.  It has continued to smolder since that time and has not healed despite subsequent change in antibiotics.  She is here now for our evaluation and advice.  The patient admittedly does not aggressively control her diabetes with blood sugars running ordinarily in the range of high 200- 300.  She has diabetic peripheral sensory neuropathy, thus had no recognized complications from the disease.  PAST MEDICAL HISTORY:  Includes no surgeries and no medical hospitalizations.  She has no known medicinal allergies.  MEDICATIONS:  Her regular medications Include. 1. A daily vitamin D tablet. 2. 81 mg of aspirin daily. 3. Chlorthalidone 25 mg. 4. Lisinopril 10 mg daily. 5. Metformin 100 mg b.i.d. 6. Metoprolol 25 mg daily. 7. Naprosyn 220 mg p.r.n. 8. Her current amoxicillin 125 mg b.i.d. (for which she has 3 more     days worth.)  SOCIAL HISTORY:  The patient is married.  She is not employed, and she smoked about a half a pack a day until January 2007 and has not  smoked since.  She uses alcohol occasionally but not on a daily basis.  She is currently not employed.  FAMILY HISTORY:  Notable for arthritis in the mother, alcohol abuse, and stroke in her brother, leukemia in another brother, and diabetes in the paternal grandmother.  REVIEW OF SYSTEMS:  Aside from her neuropathic symptoms and no symptoms related to the cataract which she has on her left eye, she is otherwise largely asymptomatic.  And so far, she knows her renal function is normal.  She does have a history of nephrolithiasis which I neglected to mention.  She has also had some restless legs syndrome, which is not thought to be a part of her diabetic neuropathy.  Recent laboratory evaluation has shown her creatinine to be 0.6, her BUN 16.  EXAMINATION:  VITAL SIGNS: Blood pressure is 160/96, pulse is 88 and regular, respirations 18, temperature 98.3. GENERAL: She is an alert, cooperative, middle-aged white female, in no apparent distress.  General examination was not accomplished. EXTREMITIES: Left lower extremity shows no significant edema.  Excellent pulses present at both locations in the feet, adequate capillary filling.  She does have some decreased sensitivity on that foot.  On the lateral distal left lower extremity approximately 4 cm proximal to the lateral malleolus is a lesion  measuring 1.2 x 1.7 x 0.2 cm and consisting of chronic-appearing wound with semi-solid crust and eschar in its base.  There is minimal surrounding erythema, certainly nothing to suggest deep or spreading infection.  IMPRESSION:  Probable insect envenomation of the left lower extremity with resulting ulcer and eschar.  DISPOSITION:  The eschar is gently debrided as best can be tolerated by the patient, and this gets approximately three-fourths of it.  She will be treated to follow with application of Santyl and an absorptive dressing and that extremity placed in a Profore Lite wrap.  She  is advised to complete her additional 3 days of amoxicillin and then to let it lapse.  She is to notify us immediately for any systemic symptoms or other problems, but otherwise is to be seen again in 1 week for further evaluation.  Incidentally, once the eschar was removed, the wound was cultured.          ______________________________ Jonelle Sports. Cheryll Cockayne, M.D.     RES/MEDQ  D:  12/29/2011  T:  12/29/2011  Job:  [id number]

## 2011-12-30 NOTE — Telephone Encounter (Signed)
RX sent to pharmacy  

## 2012-01-01 ENCOUNTER — Ambulatory Visit (INDEPENDENT_AMBULATORY_CARE_PROVIDER_SITE_OTHER): Payer: BC Managed Care – PPO | Admitting: Family Medicine

## 2012-01-01 DIAGNOSIS — E119 Type 2 diabetes mellitus without complications: Secondary | ICD-10-CM

## 2012-01-05 ENCOUNTER — Other Ambulatory Visit: Payer: Self-pay | Admitting: Family Medicine

## 2012-01-05 MED ORDER — GLUCOSE BLOOD VI STRP: 1.0000 | ORAL_STRIP | Status: DC | PRN

## 2012-01-05 NOTE — Telephone Encounter (Signed)
RX sent to pharmacy  

## 2012-01-08 NOTE — Progress Notes (Signed)
[name], female   DOB: [birth year], 46 y.o.   MRN: [id number] Patient could not stay for appt

## 2012-01-12 ENCOUNTER — Encounter (HOSPITAL_BASED_OUTPATIENT_CLINIC_OR_DEPARTMENT_OTHER): Payer: BC Managed Care – PPO | Attending: Internal Medicine

## 2012-01-12 ENCOUNTER — Ambulatory Visit (INDEPENDENT_AMBULATORY_CARE_PROVIDER_SITE_OTHER): Payer: BC Managed Care – PPO | Admitting: Family Medicine

## 2012-01-12 ENCOUNTER — Encounter: Payer: Self-pay | Admitting: Family Medicine

## 2012-01-12 VITALS — BP 139/86 | HR 93 | Temp 99.0°F | Ht 62.0 in | Wt 148.4 lb

## 2012-01-12 DIAGNOSIS — Z79899 Other long term (current) drug therapy: Secondary | ICD-10-CM | POA: Insufficient documentation

## 2012-01-12 DIAGNOSIS — E1149 Type 2 diabetes mellitus with other diabetic neurological complication: Secondary | ICD-10-CM | POA: Insufficient documentation

## 2012-01-12 DIAGNOSIS — E1169 Type 2 diabetes mellitus with other specified complication: Secondary | ICD-10-CM

## 2012-01-12 DIAGNOSIS — E1142 Type 2 diabetes mellitus with diabetic polyneuropathy: Secondary | ICD-10-CM | POA: Insufficient documentation

## 2012-01-12 DIAGNOSIS — L97809 Non-pressure chronic ulcer of other part of unspecified lower leg with unspecified severity: Secondary | ICD-10-CM | POA: Insufficient documentation

## 2012-01-12 DIAGNOSIS — IMO0001 Reserved for inherently not codable concepts without codable children: Secondary | ICD-10-CM

## 2012-01-12 DIAGNOSIS — L97909 Non-pressure chronic ulcer of unspecified part of unspecified lower leg with unspecified severity: Secondary | ICD-10-CM

## 2012-01-12 DIAGNOSIS — E1165 Type 2 diabetes mellitus with hyperglycemia: Secondary | ICD-10-CM

## 2012-01-12 DIAGNOSIS — E119 Type 2 diabetes mellitus without complications: Secondary | ICD-10-CM

## 2012-01-12 DIAGNOSIS — E11622 Type 2 diabetes mellitus with other skin ulcer: Secondary | ICD-10-CM

## 2012-01-12 DIAGNOSIS — I10 Essential (primary) hypertension: Secondary | ICD-10-CM

## 2012-01-12 LAB — GLUCOSE, POCT (MANUAL RESULT ENTRY): POC Glucose: 413

## 2012-01-12 MED ORDER — METFORMIN HCL 1000 MG PO TABS: ORAL_TABLET | ORAL | Status: DC

## 2012-01-12 NOTE — Assessment & Plan Note (Signed)
HGBA1C  Over 13. She agrees to start Lantus 10 units

## 2012-01-12 NOTE — Assessment & Plan Note (Signed)
Now being managed by wound management. She actually had a skin graft today and she reports she sees them once a week and is improving slowly

## 2012-01-12 NOTE — Patient Instructions (Signed)
How and Where to Give Insulin Injections, Adult People with type 1 diabetes must take insulin since their bodies do not make it. People with type 2 diabetes may require insulin. There are many different types of insulin. The type of insulin you take will determine how many injections you will take and when to take the injections.  CHOOSING A SITE FOR INJECTION Insulin absorption varies from site to site. It is best to inject insulin within the same body region. Do not inject the insulin in the same spot for each injection. There are 4 main regions that can be used for injecting. The regions include the:  Abdomen (this is the preferred site).   Front and upper outer sides of thighs.   Back of upper arm.   Buttocks.  USING A SYRINGE AND VIAL Drawing up insulin: single insulin dose 1. Wash your hands with soap and water.  2. Warm the medicine by gently rolling the bottle (vial) between your hands. Do not shake the vial.  3. Clean the top rubber part of the vial with an alcohol wipe. Be sure that the plastic pop-top has been removed on newer vials.  4. Remove the plastic cover from the needle on the syringe. Do not let the needle touch anything.  5. Pull the plunger back to draw air into the syringe. The air should be the same amount as the insulin dose.  6. Push the needle through the rubber on the top of the vial. Do not turn the vial over.  7. Push the plunger in all the way to put the air into the vial.  8. Leave the needle in the vial and turn the vial and syringe upside down.  9. Pull down slowly on the plunger, drawing the amount of insulin you need into the syringe.  10. Look for air bubbles in the syringe. You may need to push the plunger up and down 2 to 3 times to slowly get rid of any air bubbles in the syringe.  11. Pull back the plunger to get your correct dose.  12. Remove the needle from the vial.  13. Use an alcohol wipe to clean the area of the body to be injected.  14. Pinch  up 1 inch of skin and hold it.  15. Put the needle straight into the skin (90-degree angle). Put the needle in as far as it will go (to the hub). The needle may need to be injected at a 45-degree angle in small adults with little fat.  16. When the needle is in, you can let go of your skin.  17. Push the plunger down all the way to inject the insulin.  18. Pull the needle straight out of the skin.  19. Press the alcohol wipe over the spot where you gave your injection. Keep it there for a few seconds. Do not rub the area.  20. Do not put the plastic cover back on the needle.  Drawing up insulin: mixing 2 insulins 1. Wash your hands with soap and water.  2. Roll the vial of "cloudy" insulin between your hands or rotate the vial from top to bottom to mix.  3. Clean the top of both vials with an alcohol wipe. Be sure that the plastic pop-top lid has been removed on newer vials.  4. Pull air into the syringe to equal the dose of "cloudy" insulin.  5. Stick the needle into the "cloudy" insulin vial and inject the air. Be sure to  keep the vial upright.  6. Remove the needle from the "cloudy" insulin vial.  7. Pull air into the syringe to equal the dose of "clear" insulin.  8. Stick the needle into the "clear" insulin vial and inject the air.  9. Leave the needle in the "clear" insulin vial and turn the vial upside down.  10. Pull down on the plunger and slowly draw into the syringe the number of units of "clear" insulin desired.  11. Look for air bubbles in the syringe. You may need to push the plunger up and down 2 to 3 times to slowly get rid of any air bubbles in the syringe.  12. Remove the needle from the "clear" insulin vial.  13. Stick the needle into the "cloudy" insulin vial. Do not inject any of the "clear" insulin into the "cloudy" vial.  14. Turn the "cloudy" vial upside down and pull the plunger down to the number of units that equals the total number of units of "clear" and "cloudy"  insulins.  15. Remove the needle from the "cloudy" insulin vial.  16. Use an alcohol wipe to clean the area of the body to be injected.  17. Put the needle straight into the skin (90-degree angle). Put the needle in as far as it will go (to the hub). The needle may need to be injected at a 45-degree angle in small adults with little fat.  18. When the needle is in, you can let go of your skin.  19. Push the plunger down all the way to inject the insulin.  20. Pull the needle straight out of the skin.  21. Press the alcohol wipe over the spot where you gave your injection. Keep it there for a few seconds. Do not rub the area.  22. Do not put the plastic cover back on the needle.  USING INSULIN PENS 1. Wash your hands with soap and water.  2. If you are using the "cloudy" insulin, roll the pen between your palms several times or rotate the pen top to bottom several times.  3. Remove the insulin pen cap.  4. Clean the rubber stopper of the cartridge with an alcohol wipe.  5. Remove the protective paper tab from the disposable needle.  6. Screw the needle onto the pen.  7. Remove the outer plastic needle cover.  8. Remove the inner plastic needle cover.  9. Prime the insulin pen by turning the button (dial) to 2 units. Hold the pen with the needle pointing up, and push the dial on the opposite end until a drop of insulin appears at the needle tip. If no insulin appears, repeat this step.  10. Dial the number of units of insulin you will inject.  11. Use an alcohol wipe to clean the area of the body to be injected.  12. Pinch up 1 inch of skin and hold it.  13. Put the needle straight into the skin (90-degree angle).  14. Push the dial down to push the insulin into the fat tissue.  15. Count to 10 slowly. Then, remove the needle from the fat tissue.  16. Carefully replace the larger outer plastic needle cover over the needle and unscrew the capped needle.  THROWING AWAY SUPPLIES  Discard used  needles in a puncture proof sharps disposal container. Follow disposal regulations for the area where you live.   Vials and empty disposable pens may be thrown away in the regular trash.  Document Released: 11/15/2003 Document Revised:  08/14/2011 Document Reviewed: 04/08/2011 [facility] Patient Information 2012 [place], [place].   Start with Lantus 10 units at bedtime, check blood sugars in am prior to eating, 1 hour after dinner and at bedtime.  Increase Lantus by 2 units every 3rd day as long as numbers remain above 200. Send [name] sugar numbers weekly and call if any questions

## 2012-01-12 NOTE — Progress Notes (Signed)
[name], female   DOB: 1966-08-16, 46 y.o.   MRN: [id number] [name] [id number] 04/07/1966 01/12/2012      Progress Note-Follow Up  Subjective  Chief Complaint  Chief Complaint  Patient presents with  . Follow-up    HPI  Patient is a 46 year old Caucasian female who is in today for followup. Long discussion was had about her ongoing high sugar numbers. At present she is having troubles or meter but she said 2 hemoglobin A1c is now over 13. He finally agrees to adding insulin. She does acknowledge fatigue but she does not note any polyuria or polydipsia. She's been seen by wound management once weekly at this point for her left lower extremity nonhealing ulcer. They debrided the ulcer multiple times and today actually performed a skin graft. She says it is slowly improving they're happy with the progression of the wound. She is having decreased pain. She denies fevers and chills. Denies chest pain, palpitations, shortness of breath GI or GU concerns at this time. She does know when she was first placed on metformin she had diarrhea but that has resolved.  Past Medical History  Diagnosis Date  . Hypertension   . Hyperlipidemia 12/06/2010  . Overweight 12/06/2010  . NEPHROLITHIASIS, HX OF 10/07/2010  . RESTLESS LEG SYNDROME 10/25/2010  . PERIPHERAL NEUROPATHY, FEET 10/07/2010  . HEART MURMUR, HX OF 10/07/2010  . Essential hypertension, benign 10/07/2010  . DM 10/07/2010  . Disturbance of skin sensation 10/07/2010  . COMMON MIGRAINE 10/07/2010  . Acute bronchitis 07/08/2011  . Diabetes mellitus type II, uncontrolled 10/07/2010    Qualifier: Diagnosis of  By: Abner Greenspan MD, Misty Stanley      History reviewed. No pertinent past surgical history.  Family History  Problem Relation Age of Onset  . Arthritis Mother   . Stroke Brother     previous smoker  . Alcohol abuse Brother     in remission  . Leukemia Brother   . Diabetes Paternal Grandmother     History   Social  History  . Marital Status: Married    Spouse Name: N/A    Number of Children: N/A  . Years of Education: N/A   Occupational History  . Not on file.   Social History Main Topics  . Smoking status: Former Smoker -- 0.5 packs/day    Quit date: 09/08/2005  . Smokeless tobacco: Never Used  . Alcohol Use: No     occasional  . Drug Use: No  . Sexually Active: Yes -- Female partner(s)   Other Topics Concern  . Not on file   Social History Narrative  . No narrative on file    Current Outpatient Prescriptions on File Prior to Visit  Medication Sig Dispense Refill  . aspirin 81 MG tablet Take 1 tablet (81 mg total) by mouth daily.  30 tablet    . bifidobacterium infantis (ALIGN) capsule Take 1 capsule by mouth daily.      . chlorthalidone (HYGROTON) 25 MG tablet Take 1 tablet (25 mg total) by mouth daily.  30 tablet  3  . gabapentin (NEURONTIN) 100 MG capsule 2 tabs po qhs and 1 tab po bid  120 capsule  1  . glucose blood test strip 1 each by Other route as needed for other. Check Blood sugar as directed by MD  100 each  2  . Krill Oil CAPS Take 1 capsule by mouth daily.  30 capsule    . lisinopril (PRINIVIL,ZESTRIL) 10 MG tablet  Take 1 tablet (10 mg total) by mouth daily.  30 tablet  11  . metoprolol (TOPROL-XL) 25 MG 24 hr tablet Take 25 mg by mouth daily.        . naproxen sodium (ALEVE) 220 MG tablet Take 220 mg by mouth as needed.      . ONE TOUCH LANCETS MISC Use as directed to check Blood Sugar  200 each  1  . DISCONTD: metFORMIN (GLUCOPHAGE) 1000 MG tablet Take 1 tablet (1,000 mg total) by mouth 2 (two) times daily with a meal.  60 tablet  3    No Known Allergies  Review of Systems  Review of Systems  Constitutional: Positive for malaise/fatigue. Negative for fever.  HENT: Negative for congestion.   Eyes: Negative for discharge.  Respiratory: Negative for shortness of breath.   Cardiovascular: Negative for chest pain, palpitations and leg swelling.  Gastrointestinal:  Negative for nausea, abdominal pain and diarrhea.  Genitourinary: Negative for dysuria, frequency and flank pain.  Musculoskeletal: Negative for falls.  Skin: Negative for rash.       Skin graft Left lower leg today  Neurological: Negative for loss of consciousness and headaches.  Endo/Heme/Allergies: Negative for polydipsia.  Psychiatric/Behavioral: Negative for depression and suicidal ideas. The patient is not nervous/anxious and does not have insomnia.     Objective  BP 139/86  Pulse 93  Temp(Src) 99 F (37.2 C) (Temporal)  Ht 5\' 2"  (1.575 m)  Wt 148 lb 6.4 oz (67.314 kg)  BMI 27.14 kg/m2  SpO2 96%  Physical Exam  Physical Exam  Constitutional: She is oriented to person, place, and time and well-developed, well-nourished, and in no distress. No distress.  HENT:  Head: Normocephalic and atraumatic.  Eyes: Conjunctivae are normal.  Neck: Neck supple. No thyromegaly present.  Cardiovascular: Normal rate, regular rhythm and normal heart sounds.   No murmur heard. Pulmonary/Chest: Effort normal and breath sounds normal. She has no wheezes.  Abdominal: She exhibits no distension and no mass.  Musculoskeletal: She exhibits no edema.       Left leg bandaged extensively from knee to ankle  Lymphadenopathy:    She has no cervical adenopathy.  Neurological: She is alert and oriented to person, place, and time.  Skin: Skin is warm and dry. No rash noted. She is not diaphoretic.  Psychiatric: Memory, affect and judgment normal.    Lab Results  Component Value Date   TSH 2.01 12/22/2011   Lab Results  Component Value Date   WBC 6.7 12/22/2011   HGB 14.0 12/22/2011   HCT 43.2 12/22/2011   MCV 89.5 12/22/2011   PLT 347.0 12/22/2011   Lab Results  Component Value Date   CREATININE 0.5 12/22/2011   BUN 18 12/22/2011   NA 138 12/22/2011   K 4.9 12/22/2011   CL 102 12/22/2011   CO2 27 12/22/2011   Lab Results  Component Value Date   ALT 26 12/22/2011   AST 20 12/22/2011   ALKPHOS  98 12/22/2011   BILITOT 0.5 12/22/2011   Lab Results  Component Value Date   CHOL 188 12/22/2011   Lab Results  Component Value Date   HDL 44.10 12/22/2011   Lab Results  Component Value Date   LDLCALC 131* 12/22/2011   Lab Results  Component Value Date   TRIG 66.0 12/22/2011   Lab Results  Component Value Date   CHOLHDL 4 12/22/2011     Assessment & Plan  ESSENTIAL HYPERTENSION, BENIGN Adequately controlled on current meds  Diabetic ulcer of lower extremity Now being managed by wound management. She actually had a skin graft today and she reports she sees them once a week and is improving slowly  Diabetes mellitus type II, uncontrolled HGBA1C  Over 13. She agrees to start Lantus 10 units

## 2012-01-12 NOTE — Assessment & Plan Note (Signed)
Adequately controlled on current meds 

## 2012-01-21 ENCOUNTER — Encounter: Payer: Self-pay | Admitting: Family Medicine

## 2012-01-21 ENCOUNTER — Ambulatory Visit (INDEPENDENT_AMBULATORY_CARE_PROVIDER_SITE_OTHER): Payer: BC Managed Care – PPO | Admitting: Family Medicine

## 2012-01-21 VITALS — BP 160/88 | HR 80 | Temp 98.7°F | Ht 62.0 in | Wt 147.8 lb

## 2012-01-21 DIAGNOSIS — E11622 Type 2 diabetes mellitus with other skin ulcer: Secondary | ICD-10-CM

## 2012-01-21 DIAGNOSIS — IMO0001 Reserved for inherently not codable concepts without codable children: Secondary | ICD-10-CM

## 2012-01-21 DIAGNOSIS — E119 Type 2 diabetes mellitus without complications: Secondary | ICD-10-CM

## 2012-01-21 DIAGNOSIS — E1165 Type 2 diabetes mellitus with hyperglycemia: Secondary | ICD-10-CM

## 2012-01-21 DIAGNOSIS — G589 Mononeuropathy, unspecified: Secondary | ICD-10-CM

## 2012-01-21 DIAGNOSIS — L97909 Non-pressure chronic ulcer of unspecified part of unspecified lower leg with unspecified severity: Secondary | ICD-10-CM

## 2012-01-21 DIAGNOSIS — I739 Peripheral vascular disease, unspecified: Secondary | ICD-10-CM

## 2012-01-21 DIAGNOSIS — G629 Polyneuropathy, unspecified: Secondary | ICD-10-CM

## 2012-01-21 DIAGNOSIS — I10 Essential (primary) hypertension: Secondary | ICD-10-CM

## 2012-01-21 DIAGNOSIS — G609 Hereditary and idiopathic neuropathy, unspecified: Secondary | ICD-10-CM

## 2012-01-21 DIAGNOSIS — E1169 Type 2 diabetes mellitus with other specified complication: Secondary | ICD-10-CM

## 2012-01-21 HISTORY — DX: Peripheral vascular disease, unspecified: I73.9

## 2012-01-21 MED ORDER — LISINOPRIL 10 MG PO TABS: 10.0000 mg | ORAL_TABLET | Freq: Two times a day (BID) | ORAL | Status: DC

## 2012-01-21 MED ORDER — GABAPENTIN 300 MG PO CAPS: 300.0000 mg | ORAL_CAPSULE | Freq: Two times a day (BID) | ORAL | Status: DC

## 2012-01-21 MED ORDER — INSULIN GLARGINE 100 UNIT/ML ~~LOC~~ SOLN: 14.0000 [IU] | Freq: Every day | SUBCUTANEOUS | Status: DC

## 2012-01-21 MED ORDER — ASPIRIN 81 MG PO TABS: 81.0000 mg | ORAL_TABLET | Freq: Two times a day (BID) | ORAL | Status: DC

## 2012-01-21 NOTE — Patient Instructions (Signed)
Peripheral Vascular Disease  Peripheral Vascular Disease (PVD), also called Peripheral Arterial Disease (PAD), is a circulation problem caused by cholesterol (atherosclerotic plaque) deposits in the arteries. PVD commonly occurs in the lower extremities (legs) but it can occur in other areas of the body, such as your arms. The cholesterol buildup in the arteries reduces blood flow which can cause pain and other serious problems. The presence of PVD can place a person at risk for Coronary Artery Disease (CAD).   CAUSES   Causes of PVD can be many. It is usually associated with more than one risk factor such as:    High Cholesterol.   Smoking.   Diabetes.   Lack of exercise or inactivity.   High blood pressure (hypertension).   Obesity.   Family history.  SYMPTOMS    When the lower extremities are affected, patients with PVD may experience:   Leg pain with exertion or physical activity. This is called INTERMITTENT CLAUDICATION. This may present as cramping or numbness with physical activity. The location of the pain is associated with the level of blockage. For example, blockage at the abdominal level (distal abdominal aorta) may result in buttock or hip pain. Lower leg arterial blockage may result in calf pain.   As PVD becomes more severe, pain can develop with less physical activity.   In people with severe PVD, leg pain may occur at rest.   Other PVD signs and symptoms:   Leg numbness or weakness.   Coldness in the affected leg or foot, especially when compared to the other leg.   A change in leg color.   Patients with significant PVD are more prone to ulcers or sores on toes, feet or legs. These may take longer to heal or may reoccur. The ulcers or sores can become infected.   If signs and symptoms of PVD are ignored, gangrene may occur. This can result in the loss of toes or loss of an entire limb.   Not all leg pain is related to PVD. Other medical conditions can cause leg pain such  as:   Blood clots (embolism) or Deep Vein Thrombosis.   Inflammation of the blood vessels (vasculitis).   Spinal stenosis.  DIAGNOSIS   Diagnosis of PVD can involve several different types of tests. These can include:   Pulse Volume Recording Method (PVR). This test is simple, painless and does not involve the use of X-rays. PVR involves measuring and comparing the blood pressure in the arms and legs. An ABI (Ankle-Brachial Index) is calculated. The normal ratio of blood pressures is 1. As this number becomes smaller, it indicates more severe disease.   < 0.95 - indicates significant narrowing in one or more leg vessels.   <0.8 - there will usually be pain in the foot, leg or buttock with exercise.   <0.4 - will usually have pain in the legs at rest.   <0.25 - usually indicates limb threatening PVD.   Doppler detection of pulses in the legs. This test is painless and checks to see if you have a pulses in your legs/feet.   A dye or contrast material (a substance that highlights the blood vessels so they show up on x-ray) may be given to help your caregiver better see the arteries for the following tests. The dye is eliminated from your body by the kidney's. Your caregiver may order blood work to check your kidney function and other laboratory values before the following tests are performed:   Magnetic Resonance   study of the blood vessels and arteries. The MRA machine uses a large magnet to produce images of the blood vessels.   Computed Tomography Angiography (CTA). A CTA is a specialized x-ray that looks at how the blood flows in your blood vessels. An IV may be inserted into your arm so contrast dye can be injected.   Angiogram. Is a procedure that uses x-rays to look at your blood vessels. This procedure is minimally invasive, meaning a small incision (cut) is made in your groin. A small tube (catheter) is then inserted into the artery of  your groin. The catheter is guided to the blood vessel or artery your caregiver wants to examine. Contrast dye is injected into the catheter. X-rays are then taken of the blood vessel or artery. After the images are obtained, the catheter is taken out.  TREATMENT  Treatment of PVD involves many interventions which may include:  Lifestyle changes:   Quitting smoking.   Exercise.   Following a low fat, low cholesterol diet.   Control of diabetes.   Foot care is very important to the PVD patient. Good foot care can help prevent infection.   Medication:   Cholesterol-lowering medicine.   Blood pressure medicine.   Anti-platelet drugs.   Certain medicines may reduce symptoms of Intermittent Claudication.   Interventional/Surgical options:   Angioplasty. An Angioplasty is a procedure that inflates a balloon in the blocked artery. This opens the blocked artery to improve blood flow.   Stent Implant. A wire mesh tube (stent) is placed in the artery. The stent expands and stays in place, allowing the artery to remain open.   Peripheral Bypass Surgery. This is a surgical procedure that reroutes the blood around a blocked artery to help improve blood flow. This type of procedure may be performed if Angioplasty or stent implants are not an option.  SEEK IMMEDIATE MEDICAL CARE IF:   You develop pain or numbness in your arms or legs.   Your arm or leg turns cold, becomes blue in color.   You develop redness, warmth, swelling and pain in your arms or legs.  MAKE SURE YOU:   Understand these instructions.   Will watch your condition.   Will get help right away if you are not doing well or get worse.  Document Released: 10/02/2004 Document Revised: 08/14/2011 Document Reviewed: 08/29/2008 [facility] Patient Information 2012 [place], [place].  Increase Gabapentin 300 mg at bedtime for the next week and if you tolerate the increase then add 100mg  of Gabapentin upon awakening and increase  to 300 mg as tolerated

## 2012-01-21 NOTE — Assessment & Plan Note (Signed)
Continues to follow with wound clinic and has been put back on an antibiotics. Continue probiotics

## 2012-01-21 NOTE — Progress Notes (Signed)
[name], female   DOB: 1966-01-13, 46 y.o.   MRN: [id number] [name] [id number] 08/19/1966 01/21/2012      Progress Note-Follow Up  Subjective  Chief Complaint  Chief Complaint  Patient presents with  . feet hurting    both feet hurting- hurting worse since Dec    HPI  Patient is a 46 year old Caucasian female who is in today to discuss persistent pain in her feet. She's having more trouble to her feet feeling cool and lack of sensation in the bottoms of her feet and discomfort and burning in the rest of her feet. She is tolerating her gabapentin and is currently taking 100 mg at bedtime. Continues to follow with wound clinic for her nonhealing ulcer on the left leg. They're to start her back on antibiotics and is a surrounding erythema. She denies myalgias fevers or signs of systemic illness. Blood sugars continue to run high she said numbers in the high 300s and as low as the low 200s. Today was her lowest at 193. She continues to struggle with fatigue but denies chest pain, palpitations. Pain from her feet has been bad enough to keep her up at night  Past Medical History  Diagnosis Date  . Hypertension   . Hyperlipidemia 12/06/2010  . Overweight 12/06/2010  . NEPHROLITHIASIS, HX OF 10/07/2010  . RESTLESS LEG SYNDROME 10/25/2010  . PERIPHERAL NEUROPATHY, FEET 10/07/2010  . HEART MURMUR, HX OF 10/07/2010  . Essential hypertension, benign 10/07/2010  . DM 10/07/2010  . Disturbance of skin sensation 10/07/2010  . COMMON MIGRAINE 10/07/2010  . Acute bronchitis 07/08/2011  . Diabetes mellitus type II, uncontrolled 10/07/2010    Qualifier: Diagnosis of  By: Abner Greenspan MD, Misty Stanley    . PVD (peripheral vascular disease) 01/21/2012    History reviewed. No pertinent past surgical history.  Family History  Problem Relation Age of Onset  . Arthritis Mother   . Stroke Brother     previous smoker  . Alcohol abuse Brother     in remission  . Leukemia Brother   . Diabetes  Paternal Grandmother     History   Social History  . Marital Status: Married    Spouse Name: N/A    Number of Children: N/A  . Years of Education: N/A   Occupational History  . Not on file.   Social History Main Topics  . Smoking status: Former Smoker -- 0.5 packs/day    Quit date: 09/08/2005  . Smokeless tobacco: Never Used  . Alcohol Use: No     occasional  . Drug Use: No  . Sexually Active: Yes -- Female partner(s)   Other Topics Concern  . Not on file   Social History Narrative  . No narrative on file    Current Outpatient Prescriptions on File Prior to Visit  Medication Sig Dispense Refill  . bifidobacterium infantis (ALIGN) capsule Take 1 capsule by mouth daily.      . chlorthalidone (HYGROTON) 25 MG tablet Take 1 tablet (25 mg total) by mouth daily.  30 tablet  3  . glucose blood test strip 1 each by Other route as needed for other. Check Blood sugar as directed by MD  100 each  2  . Krill Oil CAPS Take 1 capsule by mouth daily.  30 capsule    . metFORMIN (GLUCOPHAGE) 1000 MG tablet 1 tab po bid and 1/2 tab po q noon  75 tablet  5  . metoprolol (TOPROL-XL) 25 MG 24 hr  tablet Take 25 mg by mouth daily.        . naproxen sodium (ALEVE) 220 MG tablet Take 220 mg by mouth as needed.      . ONE TOUCH LANCETS MISC Use as directed to check Blood Sugar  200 each  1  . DISCONTD: lisinopril (PRINIVIL,ZESTRIL) 10 MG tablet Take 1 tablet (10 mg total) by mouth daily.  30 tablet  11  . insulin glargine (LANTUS SOLOSTAR) 100 UNIT/ML injection Inject 14 Units into the skin at bedtime.  5 pen  PRN    No Known Allergies  Review of Systems  Review of Systems  Constitutional: Positive for malaise/fatigue. Negative for fever.  HENT: Negative for congestion.   Eyes: Negative for discharge.  Respiratory: Negative for shortness of breath.   Cardiovascular: Negative for chest pain, palpitations and leg swelling.  Gastrointestinal: Negative for nausea, abdominal pain and diarrhea.    Genitourinary: Negative for dysuria.  Musculoskeletal: Positive for myalgias and joint pain. Negative for falls.  Skin: Negative for rash.  Neurological: Negative for loss of consciousness and headaches.  Endo/Heme/Allergies: Negative for polydipsia.  Psychiatric/Behavioral: Negative for depression and suicidal ideas. The patient is not nervous/anxious and does not have insomnia.     Objective  BP 160/88  Pulse 80  Temp(Src) 98.7 F (37.1 C) (Temporal)  Ht 5\' 2"  (1.575 m)  Wt 147 lb 12.8 oz (67.042 kg)  BMI 27.03 kg/m2  SpO2 98%  Physical Exam  Physical Exam  Constitutional: She is oriented to person, place, and time and well-developed, well-nourished, and in no distress. No distress.  HENT:  Head: Normocephalic and atraumatic.  Eyes: Conjunctivae are normal.  Neck: Neck supple. No thyromegaly present.  Cardiovascular: Normal rate, regular rhythm and normal heart sounds.   No murmur heard. Pulmonary/Chest: Effort normal and breath sounds normal. She has no wheezes.  Abdominal: She exhibits no distension and no mass.  Musculoskeletal: She exhibits no edema.       Left lower leg bandaged.   Lymphadenopathy:    She has no cervical adenopathy.  Neurological: She is alert and oriented to person, place, and time.  Skin: Skin is warm and dry. No rash noted. She is not diaphoretic.       Tips of toes pale and cool. Mildly ecchymotic. Toenails thickening and slightly yellow  Psychiatric: Memory, affect and judgment normal.    Lab Results  Component Value Date   TSH 2.01 12/22/2011   Lab Results  Component Value Date   WBC 6.7 12/22/2011   HGB 14.0 12/22/2011   HCT 43.2 12/22/2011   MCV 89.5 12/22/2011   PLT 347.0 12/22/2011   Lab Results  Component Value Date   CREATININE 0.5 12/22/2011   BUN 18 12/22/2011   NA 138 12/22/2011   K 4.9 12/22/2011   CL 102 12/22/2011   CO2 27 12/22/2011   Lab Results  Component Value Date   ALT 26 12/22/2011   AST 20 12/22/2011   ALKPHOS 98  12/22/2011   BILITOT 0.5 12/22/2011   Lab Results  Component Value Date   CHOL 188 12/22/2011   Lab Results  Component Value Date   HDL 44.10 12/22/2011   Lab Results  Component Value Date   LDLCALC 131* 12/22/2011   Lab Results  Component Value Date   TRIG 66.0 12/22/2011   Lab Results  Component Value Date   CHOLHDL 4 12/22/2011     Assessment & Plan  Diabetes mellitus type II, uncontrolled Increased Lantus  from 10 units to 14 units today and will have her increase by 2 units every 2 days if sugar numbers remain above 150. She will call us with sugar numbers in 2 days for further instructions. Avoid simple carbs.   Diabetic ulcer of lower extremity Continues to follow with wound clinic and has been put back on an antibiotics. Continue probiotics  PERIPHERAL NEUROPATHY, FEET Increase Gabapentin to 300 mg bid and control sugars  PVD (peripheral vascular disease) Will have her increase her Aspirin to 81 mg bid and continue to monitor

## 2012-01-21 NOTE — Assessment & Plan Note (Signed)
Increase Gabapentin to 300 mg bid and control sugars

## 2012-01-21 NOTE — Assessment & Plan Note (Signed)
Will have her increase her Aspirin to 81 mg bid and continue to monitor

## 2012-01-21 NOTE — Assessment & Plan Note (Signed)
Increased Lantus from 10 units to 14 units today and will have her increase by 2 units every 2 days if sugar numbers remain above 150. She will call us with sugar numbers in 2 days for further instructions. Avoid simple carbs.

## 2012-01-30 ENCOUNTER — Telehealth: Payer: Self-pay | Admitting: Family Medicine

## 2012-01-30 NOTE — Telephone Encounter (Signed)
Patient informed that she can come by around 2 pm this afternoon to pick up

## 2012-01-30 NOTE — Telephone Encounter (Signed)
Patient needs to pick up her FMLA paperwork today. She has to turn it in. Please call her when it is ready.

## 2012-02-09 ENCOUNTER — Encounter (HOSPITAL_BASED_OUTPATIENT_CLINIC_OR_DEPARTMENT_OTHER): Payer: BC Managed Care – PPO | Attending: Internal Medicine

## 2012-02-09 DIAGNOSIS — E1149 Type 2 diabetes mellitus with other diabetic neurological complication: Secondary | ICD-10-CM | POA: Insufficient documentation

## 2012-02-09 DIAGNOSIS — Z79899 Other long term (current) drug therapy: Secondary | ICD-10-CM | POA: Insufficient documentation

## 2012-02-09 DIAGNOSIS — L97809 Non-pressure chronic ulcer of other part of unspecified lower leg with unspecified severity: Secondary | ICD-10-CM | POA: Insufficient documentation

## 2012-02-09 DIAGNOSIS — E1142 Type 2 diabetes mellitus with diabetic polyneuropathy: Secondary | ICD-10-CM | POA: Insufficient documentation

## 2012-02-09 NOTE — Progress Notes (Signed)
[facility]  NAME:  [name], [name]             ACCOUNT NO.:  [id number]  MEDICAL RECORD NO.:  [id number]      DATE OF BIRTH:  28-Feb-1966  PHYSICIAN:  Wayland Denis, DO       VISIT DATE:  02/09/2012                                  OFFICE VISIT   The patient is a 46 year old diabetic with a lateral diabetic foot ulcer secondary to a presumed spider bite.  She has been dealing with this for nearly 6 months with some improvement.  The edges are raised.  There is a little bit of redness, but does not appear to be infected.  She does seem to be responding to the Oasis.  On exam, she is alert, oriented, cooperative, not in any acute distress. Pupils are equal.  Extraocular muscles are intact.  Breathing is unlabored.  Heart is regular.  Pulses are present.  The wound is described above.  Oasis was placed using the standard sterile technique.  With time-out, all appropriate information checked. We will continue with that for the week with a Profore Lite and have her follow up in 1 week.  If we do not get improvement pretty quickly at this point, we may need to take her to the OR and debride the edges.     Wayland Denis, DO     CS/MEDQ  D:  02/09/2012  T:  02/09/2012  Job:  [id number]

## 2012-02-16 ENCOUNTER — Encounter: Payer: Self-pay | Admitting: Family Medicine

## 2012-02-16 ENCOUNTER — Ambulatory Visit (INDEPENDENT_AMBULATORY_CARE_PROVIDER_SITE_OTHER): Payer: BC Managed Care – PPO | Admitting: Family Medicine

## 2012-02-16 VITALS — BP 164/100 | HR 78 | Temp 98.2°F | Ht 62.0 in | Wt 145.1 lb

## 2012-02-16 DIAGNOSIS — G609 Hereditary and idiopathic neuropathy, unspecified: Secondary | ICD-10-CM

## 2012-02-16 DIAGNOSIS — IMO0001 Reserved for inherently not codable concepts without codable children: Secondary | ICD-10-CM

## 2012-02-16 DIAGNOSIS — G629 Polyneuropathy, unspecified: Secondary | ICD-10-CM

## 2012-02-16 DIAGNOSIS — I739 Peripheral vascular disease, unspecified: Secondary | ICD-10-CM

## 2012-02-16 DIAGNOSIS — E1169 Type 2 diabetes mellitus with other specified complication: Secondary | ICD-10-CM

## 2012-02-16 DIAGNOSIS — G589 Mononeuropathy, unspecified: Secondary | ICD-10-CM

## 2012-02-16 DIAGNOSIS — I10 Essential (primary) hypertension: Secondary | ICD-10-CM

## 2012-02-16 DIAGNOSIS — L97909 Non-pressure chronic ulcer of unspecified part of unspecified lower leg with unspecified severity: Secondary | ICD-10-CM

## 2012-02-16 DIAGNOSIS — E1165 Type 2 diabetes mellitus with hyperglycemia: Secondary | ICD-10-CM

## 2012-02-16 DIAGNOSIS — L97929 Non-pressure chronic ulcer of unspecified part of left lower leg with unspecified severity: Secondary | ICD-10-CM

## 2012-02-16 DIAGNOSIS — E119 Type 2 diabetes mellitus without complications: Secondary | ICD-10-CM

## 2012-02-16 DIAGNOSIS — E11622 Type 2 diabetes mellitus with other skin ulcer: Secondary | ICD-10-CM

## 2012-02-16 MED ORDER — GABAPENTIN 300 MG PO CAPS: ORAL_CAPSULE | ORAL | Status: DC

## 2012-02-16 MED ORDER — INSULIN GLARGINE 100 UNIT/ML ~~LOC~~ SOLN: SUBCUTANEOUS | Status: DC

## 2012-02-16 MED ORDER — LISINOPRIL 20 MG PO TABS: 20.0000 mg | ORAL_TABLET | Freq: Two times a day (BID) | ORAL | Status: DC

## 2012-02-16 NOTE — Assessment & Plan Note (Signed)
Following with wound clinic now and is scheduled for more surgery due to non healing. At this point it is time to get a vascular consult to assess her blood flow to that region

## 2012-02-16 NOTE — Progress Notes (Signed)
[facility]  NAME:  [name], [name]             ACCOUNT NO.:  [id number]  MEDICAL RECORD NO.:  [id number]      DATE OF BIRTH:  July 03, 1966  PHYSICIAN:  Wayland Denis, DO       VISIT DATE:  02/16/2012                                  OFFICE VISIT   The patient is a 46 year old female who is here for followup on her left lower extremity lateral ankle ulcer.  We have been using Oasis with some improvement.  She is starting to granulate.  There is no sign of infection.  The periwound area does look better with less irritation. The wound edges are still little bit rolled in.  There has been no change in her medications or social history.  Her blood pressures are improving slowly.  On exam, she is alert, oriented, cooperative, not in any acute distress. She is pleasant.  Pupils are equal.  Extraocular muscles are intact.  No difficulty breathing.  Pulse regular.  Wound as described above.  We will continue with the Oasis which was placed.  Time-out was called. All information was confirmed to be correct.  Oasis was placed according to the manufacture guidelines.  All of it was used.  She was then dressed and we will see her back in 1 week.     Wayland Denis, DO     CS/MEDQ  D:  02/16/2012  T:  02/16/2012  Job:  [id number]

## 2012-02-16 NOTE — Progress Notes (Signed)
[name], female   DOB: Jul 09, 1966, 46 y.o.   MRN: [id number] JAZZY PARMER [id number] 1965/10/26 02/16/2012      Progress Note-Follow Up  Subjective  Chief Complaint  Chief Complaint  Patient presents with  . Blood Sugar Problem    running high    HPI  Patient is a 46 year old caucasian female  In today with hi blood sugars and persistent lower extremity ulcer. She is following with the wound clinic and has been told she didn't need further debridement of her leg she'll do to poor healing. She denies fevers chills but has constant pain in her leg. She continues to struggle with high sugars. She is presently using Lantus to 14 units and typically her numbers are in the low 200s but she did have an episode of sugar approaching 400 at 398 over the weekend. She is unable to pinpoint any changes in her eating that day that could have contributed. She continues to struggle with fatigue but she denies chest pain, palpitations, shortness of breath, GI or GU complaints at this time  Past Medical History  Diagnosis Date  . Hypertension   . Hyperlipidemia 12/06/2010  . Overweight 12/06/2010  . NEPHROLITHIASIS, HX OF 10/07/2010  . RESTLESS LEG SYNDROME 10/25/2010  . PERIPHERAL NEUROPATHY, FEET 10/07/2010  . HEART MURMUR, HX OF 10/07/2010  . Essential hypertension, benign 10/07/2010  . DM 10/07/2010  . Disturbance of skin sensation 10/07/2010  . COMMON MIGRAINE 10/07/2010  . Acute bronchitis 07/08/2011  . Diabetes mellitus type II, uncontrolled 10/07/2010    Qualifier: Diagnosis of  By: Abner Greenspan MD, Misty Stanley    . PVD (peripheral vascular disease) 01/21/2012    No past surgical history on file.  Family History  Problem Relation Age of Onset  . Arthritis Mother   . Stroke Brother     previous smoker  . Alcohol abuse Brother     in remission  . Leukemia Brother   . Diabetes Paternal Grandmother     History   Social History  . Marital Status: Married    Spouse Name: N/A   Number of Children: N/A  . Years of Education: N/A   Occupational History  . Not on file.   Social History Main Topics  . Smoking status: Former Smoker -- 0.5 packs/day    Quit date: 09/08/2005  . Smokeless tobacco: Never Used  . Alcohol Use: No     occasional  . Drug Use: No  . Sexually Active: Yes -- Female partner(s)   Other Topics Concern  . Not on file   Social History Narrative  . No narrative on file    Current Outpatient Prescriptions on File Prior to Visit  Medication Sig Dispense Refill  . aspirin 81 MG tablet Take 1 tablet (81 mg total) by mouth 2 (two) times daily.  30 tablet    . bifidobacterium infantis (ALIGN) capsule Take 1 capsule by mouth daily.      . chlorthalidone (HYGROTON) 25 MG tablet Take 1 tablet (25 mg total) by mouth daily.  30 tablet  3  . glucose blood test strip 1 each by Other route as needed for other. Check Blood sugar as directed by MD  100 each  2  . Krill Oil CAPS Take 1 capsule by mouth daily.  30 capsule    . metFORMIN (GLUCOPHAGE) 1000 MG tablet 1 tab po bid and 1/2 tab po q noon  75 tablet  5  . metoprolol (TOPROL-XL) 25 MG  24 hr tablet Take 25 mg by mouth daily.        . naproxen sodium (ALEVE) 220 MG tablet Take 220 mg by mouth as needed.      . ONE TOUCH LANCETS MISC Use as directed to check Blood Sugar  200 each  1  . DISCONTD: insulin glargine (LANTUS SOLOSTAR) 100 UNIT/ML injection Inject 14 Units into the skin at bedtime.  5 pen  PRN    No Known Allergies  Review of Systems  Review of Systems  Constitutional: Positive for malaise/fatigue. Negative for fever.  HENT: Negative for congestion.   Eyes: Negative for discharge.  Respiratory: Negative for shortness of breath.   Cardiovascular: Negative for chest pain, palpitations and leg swelling.  Gastrointestinal: Negative for nausea, abdominal pain and diarrhea.  Genitourinary: Negative for dysuria.  Musculoskeletal: Positive for myalgias. Negative for falls.  Skin: Negative  for rash.  Neurological: Negative for loss of consciousness and headaches.  Endo/Heme/Allergies: Negative for polydipsia.  Psychiatric/Behavioral: Negative for depression and suicidal ideas. The patient is not nervous/anxious and does not have insomnia.     Objective  BP 164/100  Pulse 78  Temp(Src) 98.2 F (36.8 C) (Temporal)  Ht 5\' 2"  (1.575 m)  Wt 145 lb 1.9 oz (65.826 kg)  BMI 26.54 kg/m2  SpO2 98%  Physical Exam  Physical Exam  Constitutional: She is oriented to person, place, and time and well-developed, well-nourished, and in no distress. No distress.  HENT:  Head: Normocephalic and atraumatic.  Eyes: Conjunctivae are normal.  Neck: Neck supple. No thyromegaly present.  Cardiovascular: Normal rate and regular rhythm.   Murmur heard.      2/6 sys m  Pulmonary/Chest: Effort normal and breath sounds normal. She has no wheezes.  Abdominal: She exhibits no distension and no mass.  Musculoskeletal: She exhibits no edema.  Lymphadenopathy:    She has no cervical adenopathy.  Neurological: She is alert and oriented to person, place, and time.  Skin: Skin is warm and dry. Rash noted. She is not diaphoretic.       Left leg, lower bandaged  Psychiatric: Memory, affect and judgment normal.    Lab Results  Component Value Date   TSH 2.01 12/22/2011   Lab Results  Component Value Date   WBC 6.7 12/22/2011   HGB 14.0 12/22/2011   HCT 43.2 12/22/2011   MCV 89.5 12/22/2011   PLT 347.0 12/22/2011   Lab Results  Component Value Date   CREATININE 0.5 12/22/2011   BUN 18 12/22/2011   NA 138 12/22/2011   K 4.9 12/22/2011   CL 102 12/22/2011   CO2 27 12/22/2011   Lab Results  Component Value Date   ALT 26 12/22/2011   AST 20 12/22/2011   ALKPHOS 98 12/22/2011   BILITOT 0.5 12/22/2011   Lab Results  Component Value Date   CHOL 188 12/22/2011   Lab Results  Component Value Date   HDL 44.10 12/22/2011   Lab Results  Component Value Date   LDLCALC 131* 12/22/2011   Lab Results   Component Value Date   TRIG 66.0 12/22/2011   Lab Results  Component Value Date   CHOLHDL 4 12/22/2011     Assessment & Plan  ESSENTIAL HYPERTENSION, BENIGN Increase Lisinopril to 20 mg po bid and maintain Metoprolol XL at 25 mg for now, may need to increase that as well  Diabetic ulcer of lower extremity Following with wound clinic now and is scheduled for more surgery due to non  healing. At this point it is time to get a vascular consult to assess her blood flow to that region  Diabetes mellitus type II, uncontrolled Numbers generally sitting in the low 200s but did see a 398 the other day. Will increase Lantus to 18 and if no sugars fall below 180 she is instructed to increase to 20 units in 3 days, call if she has any concerns  PERIPHERAL NEUROPATHY, FEET Increase Gabapentin to 300mg  po bid and 600mg  at bedtime

## 2012-02-16 NOTE — Assessment & Plan Note (Signed)
Increase Lisinopril to 20 mg po bid and maintain Metoprolol XL at 25 mg for now, may need to increase that as well

## 2012-02-16 NOTE — Assessment & Plan Note (Signed)
Numbers generally sitting in the low 200s but did see a 398 the other day. Will increase Lantus to 18 and if no sugars fall below 180 she is instructed to increase to 20 units in 3 days, call if she has any concerns

## 2012-02-16 NOTE — Patient Instructions (Signed)

## 2012-02-16 NOTE — Assessment & Plan Note (Signed)
Increase Gabapentin to 300mg  po bid and 600mg  at bedtime

## 2012-02-17 ENCOUNTER — Other Ambulatory Visit: Payer: Self-pay

## 2012-02-17 DIAGNOSIS — L98499 Non-pressure chronic ulcer of skin of other sites with unspecified severity: Secondary | ICD-10-CM

## 2012-02-17 DIAGNOSIS — I739 Peripheral vascular disease, unspecified: Secondary | ICD-10-CM

## 2012-02-19 ENCOUNTER — Other Ambulatory Visit: Payer: Self-pay

## 2012-02-19 DIAGNOSIS — I10 Essential (primary) hypertension: Secondary | ICD-10-CM

## 2012-02-19 MED ORDER — CHLORTHALIDONE 25 MG PO TABS: 25.0000 mg | ORAL_TABLET | Freq: Every day | ORAL | Status: DC

## 2012-02-20 ENCOUNTER — Other Ambulatory Visit: Payer: Self-pay

## 2012-02-20 DIAGNOSIS — G629 Polyneuropathy, unspecified: Secondary | ICD-10-CM

## 2012-02-20 DIAGNOSIS — I739 Peripheral vascular disease, unspecified: Secondary | ICD-10-CM

## 2012-02-20 MED ORDER — GABAPENTIN 300 MG PO CAPS: ORAL_CAPSULE | ORAL | Status: DC

## 2012-02-23 NOTE — Progress Notes (Signed)
[facility]  NAME:  [name], [name]             ACCOUNT NO.:  [id number]  MEDICAL RECORD NO.:  [id number]      DATE OF BIRTH:  [birth year]  PHYSICIAN:  Wayland Denis, DO       VISIT DATE:  02/23/2012                                  OFFICE VISIT   The patient is a 46 year old female who is here for followup on her left lower extremity ulcer.  We had Oasis placed last week.  She has done extremely well.  There is less rolling of the edges and more epithelialization and granulation at the base in the center portion. This is a marked improvement.  Her blood pressures have been well controlled.  No change in her medications or social history.  PHYSICAL EXAMINATION:  GENERAL:  She is alert, oriented, cooperative, not in any acute distress.  She is pleasant. HEENT:  Pupils are equal.  Extraocular muscles are intact.  No cervical lymphadenopathy. ABDOMEN:  Soft and nontender.  The wound is described above.  Another Oasis was placed.  Time-out was called.  All information was confirmed to be correct.  The Oasis was placed in a sterile fashion.  All of it was used and she will have a Coban wrap and follow up in 1 week.     Wayland Denis, DO     CS/MEDQ  D:  02/23/2012  T:  02/23/2012  Job:  [id number]

## 2012-02-26 ENCOUNTER — Encounter: Payer: Self-pay | Admitting: Vascular Surgery

## 2012-02-27 ENCOUNTER — Encounter: Payer: BC Managed Care – PPO | Admitting: Vascular Surgery

## 2012-03-01 NOTE — Progress Notes (Signed)
[facility]  NAME:  BRANDALYNN, OFALLON                  ACCOUNT NO.:  MEDICAL RECORD NO.:  [id number]      DATE OF BIRTH:  [birth year]  PHYSICIAN:  Wayland Denis, DO       VISIT DATE:  03/01/2012                                  OFFICE VISIT   The patient is a 46 year old, who is here for followup on her left lower extremity ulcer.  She originally thought this was from a spider bite. We are going to put Oasis on it and she has responded extremely well. The edges are softened up and are not rolled up as much as they had been.  She is granulating and epithelializing.  There has been no change in her medications or social history.  On exam, she is alert, oriented, cooperative, not in any acute distress. She is pleasant.  Pupils are equal.  Extraocular muscles are intact.  No cervical lymphadenopathy.  Her breathing is unlabored.  Her heart is regular.  Her abdomen is soft.  We will continue with the Oasis, which was placed with sterile technique and have her follow up in 1 week.     Wayland Denis, DO     CS/MEDQ  D:  03/01/2012  T:  03/01/2012  Job:  [id number]

## 2012-03-04 ENCOUNTER — Encounter: Payer: BC Managed Care – PPO | Admitting: Vascular Surgery

## 2012-03-08 ENCOUNTER — Encounter (HOSPITAL_BASED_OUTPATIENT_CLINIC_OR_DEPARTMENT_OTHER): Payer: BC Managed Care – PPO | Attending: Plastic Surgery

## 2012-03-08 DIAGNOSIS — L97809 Non-pressure chronic ulcer of other part of unspecified lower leg with unspecified severity: Secondary | ICD-10-CM | POA: Insufficient documentation

## 2012-03-08 DIAGNOSIS — E119 Type 2 diabetes mellitus without complications: Secondary | ICD-10-CM | POA: Insufficient documentation

## 2012-03-08 NOTE — Progress Notes (Signed)
[facility]  NAME:  [name]             ACCOUNT NO.:  [id number]  MEDICAL RECORD NO.:  [id number]      DATE OF BIRTH:  [birth year]  PHYSICIAN:  Wayland Denis, DO       VISIT DATE:  03/08/2012                                  OFFICE VISIT   HISTORY OF PRESENT ILLNESS:  The patient is a 46 year old female, who is here for followup on her left lower extremity ulcer.  She has done extremely well with the Oasis and has epithelialized and granulated extremely well.  There is a pinpoint area yet to epithelialize but it looks very good.  There has been no change in her medications or social history.  On exam, she is alert, oriented, cooperative, not in any acute distress. She is pleasant.  Pupils are equal.  Extraocular muscles are intact. Her breathing is unlabored.  Her heart is regular.  Her upper extremity pulses are strong and regular bilaterally.  The wound is described above.  We will continue with Silvercel for this week and have her follow up in 1 week.  At that time, she may be completely healed.  We have encouraged her to have strict diabetic control.  No tobacco and increase her protein.     Wayland Denis, DO     CS/MEDQ  D:  03/08/2012  T:  03/08/2012  Job:  [id number]

## 2012-03-15 NOTE — Progress Notes (Signed)
[facility]  NAME:  KIFFANY, SCHELLING             ACCOUNT NO.:  [id number]  MEDICAL RECORD NO.:  [id number]      DATE OF BIRTH:  May 02, 1966  PHYSICIAN:  Wayland Denis, DO       VISIT DATE:  03/15/2012                                  OFFICE VISIT   The patient is a 46 year old female who is here for followup on her left lower extremity ulcer.  We have been using Profore Lite and Oasis.  She has been followed by SilverCel.  She has done extremely well and is now completely healed.  There has been no change in her medications or social history.  On exam, she is alert, oriented, cooperative, not in any acute distress. She is very pleasant.  Pupils are equal.  Extraocular muscles are intact.  No cervical lymphadenopathy.  Breathing is unlabored.  Heart is regular.  The wound is healed.  We will continue with triple antibiotic ointment lotion, diabetic or travel socks, and follow up as needed.     Wayland Denis, DO     CS/MEDQ  D:  03/15/2012  T:  03/15/2012  Job:  [id number]

## 2012-04-01 ENCOUNTER — Encounter: Payer: Self-pay | Admitting: Family Medicine

## 2012-04-01 ENCOUNTER — Encounter: Payer: Self-pay | Admitting: Vascular Surgery

## 2012-04-01 ENCOUNTER — Other Ambulatory Visit: Payer: Self-pay | Admitting: Family Medicine

## 2012-04-01 ENCOUNTER — Ambulatory Visit (INDEPENDENT_AMBULATORY_CARE_PROVIDER_SITE_OTHER): Payer: BC Managed Care – PPO | Admitting: Family Medicine

## 2012-04-01 VITALS — BP 148/96 | HR 88 | Temp 97.2°F | Ht 62.0 in | Wt 147.0 lb

## 2012-04-01 DIAGNOSIS — IMO0002 Reserved for concepts with insufficient information to code with codable children: Secondary | ICD-10-CM

## 2012-04-01 DIAGNOSIS — I10 Essential (primary) hypertension: Secondary | ICD-10-CM

## 2012-04-01 DIAGNOSIS — G629 Polyneuropathy, unspecified: Secondary | ICD-10-CM

## 2012-04-01 DIAGNOSIS — R0602 Shortness of breath: Secondary | ICD-10-CM

## 2012-04-01 DIAGNOSIS — G609 Hereditary and idiopathic neuropathy, unspecified: Secondary | ICD-10-CM

## 2012-04-01 DIAGNOSIS — R5383 Other fatigue: Secondary | ICD-10-CM

## 2012-04-01 DIAGNOSIS — IMO0001 Reserved for inherently not codable concepts without codable children: Secondary | ICD-10-CM

## 2012-04-01 DIAGNOSIS — E1165 Type 2 diabetes mellitus with hyperglycemia: Secondary | ICD-10-CM

## 2012-04-01 DIAGNOSIS — E119 Type 2 diabetes mellitus without complications: Secondary | ICD-10-CM

## 2012-04-01 DIAGNOSIS — E11622 Type 2 diabetes mellitus with other skin ulcer: Secondary | ICD-10-CM

## 2012-04-01 DIAGNOSIS — I739 Peripheral vascular disease, unspecified: Secondary | ICD-10-CM

## 2012-04-01 DIAGNOSIS — L97909 Non-pressure chronic ulcer of unspecified part of unspecified lower leg with unspecified severity: Secondary | ICD-10-CM

## 2012-04-01 DIAGNOSIS — E1169 Type 2 diabetes mellitus with other specified complication: Secondary | ICD-10-CM

## 2012-04-01 DIAGNOSIS — R5381 Other malaise: Secondary | ICD-10-CM

## 2012-04-01 DIAGNOSIS — R209 Unspecified disturbances of skin sensation: Secondary | ICD-10-CM

## 2012-04-01 DIAGNOSIS — E785 Hyperlipidemia, unspecified: Secondary | ICD-10-CM

## 2012-04-01 LAB — HEPATIC FUNCTION PANEL
ALT: 24 U/L (ref 0–35)
AST: 22 U/L (ref 0–37)
Albumin: 3.9 g/dL (ref 3.5–5.2)
Alkaline Phosphatase: 125 U/L — ABNORMAL HIGH (ref 39–117)
Bilirubin, Direct: 0 mg/dL (ref 0.0–0.3)
Total Bilirubin: 0.4 mg/dL (ref 0.3–1.2)
Total Protein: 7.9 g/dL (ref 6.0–8.3)

## 2012-04-01 LAB — CBC
HCT: 43.4 % (ref 36.0–46.0)
Hemoglobin: 14.2 g/dL (ref 12.0–15.0)
MCHC: 32.7 g/dL (ref 30.0–36.0)
MCV: 88.7 fl (ref 78.0–100.0)
Platelets: 307 10*3/uL (ref 150.0–400.0)
RBC: 4.89 Mil/uL (ref 3.87–5.11)
RDW: 13.1 % (ref 11.5–14.6)
WBC: 7 10*3/uL (ref 4.5–10.5)

## 2012-04-01 LAB — RENAL FUNCTION PANEL
Albumin: 3.9 g/dL (ref 3.5–5.2)
BUN: 15 mg/dL (ref 6–23)
CO2: 27 mEq/L (ref 19–32)
Calcium: 9.6 mg/dL (ref 8.4–10.5)
Chloride: 95 mEq/L — ABNORMAL LOW (ref 96–112)
Creatinine, Ser: 0.9 mg/dL (ref 0.4–1.2)
GFR: 72.8 mL/min (ref >60.00)
Glucose, Bld: 496 mg/dL — ABNORMAL HIGH (ref 70–99)
Phosphorus: 3.6 mg/dL (ref 2.3–4.6)
Potassium: 3.8 mEq/L (ref 3.5–5.1)
Sodium: 133 mEq/L — ABNORMAL LOW (ref 135–145)

## 2012-04-01 LAB — HEMOGLOBIN A1C: Hgb A1c MFr Bld: 13.1 % — ABNORMAL HIGH (ref 4.6–6.5)

## 2012-04-01 LAB — LIPID PANEL
Cholesterol: 203 mg/dL — ABNORMAL HIGH (ref 0–200)
HDL: 48.1 mg/dL (ref >39.00)
Total CHOL/HDL Ratio: 4
Triglycerides: 152 mg/dL — ABNORMAL HIGH (ref 0.0–149.0)
VLDL: 30.4 mg/dL (ref 0.0–40.0)

## 2012-04-01 LAB — LDL CHOLESTEROL, DIRECT: Direct LDL: 136.4 mg/dL

## 2012-04-01 LAB — TSH: TSH: 2.09 u[IU]/mL (ref 0.35–5.50)

## 2012-04-01 MED ORDER — NEBIVOLOL HCL 5 MG PO TABS: 5.0000 mg | ORAL_TABLET | Freq: Every day | ORAL | Status: DC

## 2012-04-01 MED ORDER — PREGABALIN 50 MG PO CAPS: 50.0000 mg | ORAL_CAPSULE | ORAL | Status: DC

## 2012-04-01 NOTE — Assessment & Plan Note (Signed)
Started on Bystolic 5 mg daily, given samples today

## 2012-04-01 NOTE — Assessment & Plan Note (Signed)
Has appt in next 2 weeks to see vascular surgeon. Continue Aspirin daily

## 2012-04-01 NOTE — Patient Instructions (Addendum)
Peripheral Neuropathy Peripheral neuropathy is a common disorder of your nerves resulting from damage. CAUSES  This disorder may be caused by a disease of the nerves or illness. Many neuropathies have well known causes such as:  Diabetes. This is one of the most common causes.   Uremia.   AIDS.   Nutritional deficiencies.   Other causes include mechanical pressures. These may be from:   Compression.   Injury.   Contusions or bruises.   Fracture or dislocated bones.   Pressure involving the nerves close to the surface. Nerves such as the ulnar, or radial can be injured by prolonged use of crutches.  Other injuries may come from:  Tumor.   Hemorrhage or bleeding into a nerve.   Exposure to cold or radiation.   Certain medicines or toxic substances (rare).   Vascular or collagen disorders such as:   Atherosclerosis.   Systemic lupus erythematosus.   Scleroderma.   Sarcoidosis.   Rheumatoid arthritis.   Polyarteritis nodosa.   A large number of cases are of unknown cause.  SYMPTOMS  Common problems include:  Weakness.   Numbness.   Abnormal sensations (paresthesia) such as:   Burning.   Tickling.   Pricking.   Tingling.   Pain in the arms, hands, legs and/or feet.  TREATMENT  Therapy for this disorder differs depending on the cause. It may vary from medical treatment with medications or physical therapy among others.   For example, therapy for this disorder caused by diabetes involves control of the diabetes.   In cases where a tumor or ruptured disc is the cause, therapy may involve surgery. This would be to remove the tumor or to repair the ruptured disc.   In entrapment or compression neuropathy, treatment may consist of splinting or surgical decompression of the ulnar or median nerves. A common example of entrapment neuropathy is carpal tunnel syndrome. This has become more common because of the increasing use of computers.   Peroneal and  radial compression neuropathies may require avoidance of pressure.   Physical therapy and/or splints may be useful in preventing contractures. This is a condition in which shortened muscles around joints cause abnormal and sometimes painful positioning of the joints.  Document Released: 08/15/2002 Document Revised: 05/07/2011 Document Reviewed: 08/25/2005 Exit[employer] Patient Information 2012 [employer]. 

## 2012-04-01 NOTE — Assessment & Plan Note (Addendum)
Patient reports her blood sugars have been ranging from 90 to 110 over past month. Checked a hgba1c today and it is still very hi inconsistent with the numbers she is seeing. We will have her increase her Lantus to 25 units and have her start checking her sugars in am, at dinner and lunch and qhs and keep a log then have her call us next week with numbers so we can adjust Lantus further.

## 2012-04-01 NOTE — Assessment & Plan Note (Signed)
Notes increased sob and fatigue earlier in the week. She feels somewhat better today but with her history we will refer her to cardiology for further evaluation

## 2012-04-01 NOTE — Assessment & Plan Note (Signed)
Check lipids today, no changes at this time

## 2012-04-01 NOTE — Progress Notes (Signed)
[name], female   DOB: [birth year], 46 y.o.   MRN: [id number] ENDYA AUSTIN [id number] 01/07/1966 04/01/2012      Progress Note-Follow Up  Subjective  Chief Complaint  Chief Complaint  Patient presents with  . Follow-up    HPI Patient is a 46 -year-old Caucasian female in today for further evaluation of multiple concerns. She reports that her paresthesias continue to worsen. She has cold tingling feet constantly travels about halfway up her tibia. She is now noting some new numbness in tingling over her hands at the base of her first and second metacarpals and no paresthesias and decreased sensation over her left flank as well for the last few weeks. She is in today complaining of 2 days of significant fatigue and shortness of breath earlier in the week. She notes today she feels better and she did yesterday as well. She denies any fevers, congestion, GI complaints or signs of acute illness. Denies any urinary complaints, chest pain, palpitations dysuria she notes significant fatigue.   Past Medical History  Diagnosis Date  . Hypertension   . Hyperlipidemia 12/06/2010  . Overweight 12/06/2010  . NEPHROLITHIASIS, HX OF 10/07/2010  . RESTLESS LEG SYNDROME 10/25/2010  . PERIPHERAL NEUROPATHY, FEET 10/07/2010  . HEART MURMUR, HX OF 10/07/2010  . Essential hypertension, benign 10/07/2010  . DM 10/07/2010  . Disturbance of skin sensation 10/07/2010  . COMMON MIGRAINE 10/07/2010  . Acute bronchitis 07/08/2011  . Diabetes mellitus type II, uncontrolled 10/07/2010    Qualifier: Diagnosis of  By: Abner Greenspan MD, Misty Stanley    . PVD (peripheral vascular disease) 01/21/2012    History reviewed. No pertinent past surgical history.  Family History  Problem Relation Age of Onset  . Arthritis Mother   . Stroke Brother     previous smoker  . Alcohol abuse Brother     in remission  . Leukemia Brother   . Diabetes Paternal Grandmother     History   Social History  . Marital Status:  Married    Spouse Name: N/A    Number of Children: N/A  . Years of Education: N/A   Occupational History  . Not on file.   Social History Main Topics  . Smoking status: Former Smoker -- 0.5 packs/day    Quit date: 09/08/2005  . Smokeless tobacco: Never Used  . Alcohol Use: No     occasional  . Drug Use: No  . Sexually Active: Yes -- Female partner(s)   Other Topics Concern  . Not on file   Social History Narrative  . No narrative on file    Current Outpatient Prescriptions on File Prior to Visit  Medication Sig Dispense Refill  . bifidobacterium infantis (ALIGN) capsule Take 1 capsule by mouth daily.      . chlorthalidone (HYGROTON) 25 MG tablet Take 1 tablet (25 mg total) by mouth daily.  30 tablet  3  . gabapentin (NEURONTIN) 300 MG capsule 1 tab po bid and 2 qhs  120 capsule  2  . glucose blood test strip 1 each by Other route as needed for other. Check Blood sugar as directed by MD  100 each  2  . insulin glargine (LANTUS SOLOSTAR) 100 UNIT/ML injection 18 units SQ qam  5 pen  3  . Krill Oil CAPS Take 1 capsule by mouth daily.  30 capsule    . lisinopril (PRINIVIL,ZESTRIL) 20 MG tablet Take 1 tablet (20 mg total) by mouth 2 (two) times daily.  60  tablet  1  . metFORMIN (GLUCOPHAGE) 1000 MG tablet 1 tab po bid and 1/2 tab po q noon  75 tablet  5  . metoprolol (TOPROL-XL) 25 MG 24 hr tablet Take 25 mg by mouth daily.        . naproxen sodium (ALEVE) 220 MG tablet Take 220 mg by mouth as needed.      . ONE TOUCH LANCETS MISC Use as directed to check Blood Sugar  200 each  1  . nebivolol (BYSTOLIC) 5 MG tablet Take 1 tablet (5 mg total) by mouth daily.  35 tablet  0  . pregabalin (LYRICA) 50 MG capsule Take 1 capsule (50 mg total) by mouth every morning.  28 capsule  0    No Known Allergies  Review of Systems  Review of Systems  Constitutional: Positive for malaise/fatigue. Negative for fever.  HENT: Negative for congestion.   Eyes: Negative for pain and discharge.    Respiratory: Positive for sputum production. Negative for shortness of breath.   Cardiovascular: Negative for chest pain, palpitations and leg swelling.  Gastrointestinal: Negative for nausea, abdominal pain and diarrhea.  Genitourinary: Negative for dysuria.  Musculoskeletal: Negative for falls.  Skin: Negative for rash.  Neurological: Positive for tingling and sensory change. Negative for loss of consciousness and headaches.  Endo/Heme/Allergies: Negative for polydipsia.  Psychiatric/Behavioral: Negative for depression and suicidal ideas. The patient is not nervous/anxious and does not have insomnia.     Objective  BP 148/96  Pulse 88  Temp 97.2 F (36.2 C) (Temporal)  Ht 5\' 2"  (1.575 m)  Wt 147 lb (66.679 kg)  BMI 26.89 kg/m2  SpO2 98%  Physical Exam  Physical Exam  Constitutional: She is oriented to person, place, and time and well-developed, well-nourished, and in no distress. No distress.  HENT:  Head: Normocephalic and atraumatic.  Eyes: Conjunctivae are normal.  Neck: Neck supple. No thyromegaly present.  Cardiovascular: Normal rate and regular rhythm.   Murmur heard.      1/6 systolic murmur  Pulmonary/Chest: Effort normal and breath sounds normal. She has no wheezes.  Abdominal: She exhibits no distension and no mass.  Musculoskeletal: She exhibits no edema.  Lymphadenopathy:    She has no cervical adenopathy.  Neurological: She is alert and oriented to person, place, and time.  Skin: Skin is warm and dry. No rash noted. She is not diaphoretic.       Healed lesion above left lateral malleolus  Psychiatric: Memory, affect and judgment normal.    Lab Results  Component Value Date   TSH 2.09 04/01/2012   Lab Results  Component Value Date   WBC 7.0 04/01/2012   HGB 14.2 04/01/2012   HCT 43.4 04/01/2012   MCV 88.7 04/01/2012   PLT 307.0 04/01/2012   Lab Results  Component Value Date   CREATININE 0.9 04/01/2012   BUN 15 04/01/2012   NA 133* 04/01/2012   K 3.8  04/01/2012   CL 95* 04/01/2012   CO2 27 04/01/2012   Lab Results  Component Value Date   ALT 24 04/01/2012   AST 22 04/01/2012   ALKPHOS 125* 04/01/2012   BILITOT 0.4 04/01/2012   Lab Results  Component Value Date   CHOL 203* 04/01/2012   Lab Results  Component Value Date   HDL 48.10 04/01/2012   Lab Results  Component Value Date   LDLCALC 131* 12/22/2011   Lab Results  Component Value Date   TRIG 152.0* 04/01/2012   Lab Results  Component Value Date   CHOLHDL 4 04/01/2012     Assessment & Plan  ESSENTIAL HYPERTENSION, BENIGN Started on Bystolic 5 mg daily, given samples today  PVD (peripheral vascular disease) Has appt in next 2 weeks to see vascular surgeon. Continue Aspirin daily  Diabetic ulcer of lower extremity Has finally been released from the wound center because her lesion has closed  Hyperlipidemia Check lipids today, no changes at this time  Diabetes mellitus type II, uncontrolled Patient reports her blood sugars have been ranging from 90 to 110 over past month. Checked a hgba1c today and it is still very hi inconsistent with the numbers she is seeing. We will have her increase her Lantus to 25 units and have her start checking her sugars in am, at dinner and lunch and qhs and keep a log then have her call us next week with numbers so we can adjust Lantus further.   DISTURBANCE OF SKIN SENSATION Her paresthesias over her scalp resolved after her spinal tap but she now has worsening paresthesias in both hands and feet. The numbness in the hand is at the base of her thumb and second fingers b/l. Also c/o decreased, odd sensation along left flank over the last couple of months. Is referred back to neurology for further work up. Check a Vitamin B12 and Intrinsic Factor today  SOB (shortness of breath) Notes increased sob and fatigue earlier in the week. She feels somewhat better today but with her history we will refer her to cardiology for further evaluation

## 2012-04-01 NOTE — Assessment & Plan Note (Signed)
Has finally been released from the wound center because her lesion has closed

## 2012-04-01 NOTE — Assessment & Plan Note (Addendum)
Her paresthesias over her scalp resolved after her spinal tap but she now has worsening paresthesias in both hands and feet. The numbness in the hand is at the base of her thumb and second fingers b/l. Also c/o decreased, odd sensation along left flank over the last couple of months. Is referred back to neurology for further work up. Check a Vitamin B12 and Intrinsic Factor today

## 2012-04-02 ENCOUNTER — Encounter: Payer: Self-pay | Admitting: Vascular Surgery

## 2012-04-02 ENCOUNTER — Encounter (INDEPENDENT_AMBULATORY_CARE_PROVIDER_SITE_OTHER): Payer: BC Managed Care – PPO | Admitting: *Deleted

## 2012-04-02 ENCOUNTER — Ambulatory Visit (INDEPENDENT_AMBULATORY_CARE_PROVIDER_SITE_OTHER): Payer: BC Managed Care – PPO | Admitting: Vascular Surgery

## 2012-04-02 VITALS — BP 147/97 | HR 90 | Resp 20 | Ht 62.0 in | Wt 146.0 lb

## 2012-04-02 DIAGNOSIS — G609 Hereditary and idiopathic neuropathy, unspecified: Secondary | ICD-10-CM

## 2012-04-02 DIAGNOSIS — L97909 Non-pressure chronic ulcer of unspecified part of unspecified lower leg with unspecified severity: Secondary | ICD-10-CM

## 2012-04-02 DIAGNOSIS — E11622 Type 2 diabetes mellitus with other skin ulcer: Secondary | ICD-10-CM

## 2012-04-02 DIAGNOSIS — M79609 Pain in unspecified limb: Secondary | ICD-10-CM

## 2012-04-02 DIAGNOSIS — E1169 Type 2 diabetes mellitus with other specified complication: Secondary | ICD-10-CM

## 2012-04-02 LAB — HEPATITIS PANEL, ACUTE
HCV Ab: NEGATIVE
Hep A IgM: NEGATIVE
Hep B C IgM: NEGATIVE
Hepatitis B Surface Ag: NEGATIVE

## 2012-04-02 NOTE — Progress Notes (Signed)
[facility] [employer]  Referred by:  Bradd Canary, MD [address] [place] [address]  Reason for referral: Left leg pain  History of Present Illness  [name] is a 45 y.o. (1965/10/30) female who presents with chief complaint: left leg pain.  Onset of symptom occurred months ago.  Pain is described as pain due tingling in plantar surface of left foot, severity 1-3/10, and associated with paraesthesia without anesthesia.  Patient has attempted to treat this pain with rest.  The patient has no rest pain or claudication symptoms also and prior spider bite ulcer on left leg.  Patient was referred for vascular evaluation due poor wound healing of this ulcer.  Atherosclerotic risk factors include: IDDM, HTN, Hyperlipidemia, and prior smoking.  Past Medical History  Diagnosis Date  . Hypertension   . Hyperlipidemia 12/06/2010  . Overweight 12/06/2010  . NEPHROLITHIASIS, HX OF 10/07/2010  . RESTLESS LEG SYNDROME 10/25/2010  . PERIPHERAL NEUROPATHY, FEET 10/07/2010  . HEART MURMUR, HX OF 10/07/2010  . Essential hypertension, benign 10/07/2010  . DM 10/07/2010  . Disturbance of skin sensation 10/07/2010  . COMMON MIGRAINE 10/07/2010  . Acute bronchitis 07/08/2011  . Diabetes mellitus type II, uncontrolled 10/07/2010    Qualifier: Diagnosis of  By: Abner Greenspan MD, Misty Stanley    . PVD (peripheral vascular disease) 01/21/2012  . SOB (shortness of breath) 04/01/2012    No past surgical history on file.  History   Social History  . Marital Status: Married    Spouse Name: N/A    Number of Children: N/A  . Years of Education: N/A   Occupational History  . Not on file.   Social History Main Topics  . Smoking status: Former Smoker -- 0.5 packs/day    Quit date: 09/08/2005  . Smokeless tobacco: Never Used  . Alcohol Use: No     occasional  . Drug Use: No  . Sexually Active: Yes -- Female partner(s)   Other Topics Concern  . Not on file   Social History Narrative  . No  narrative on file    Family History  Problem Relation Age of Onset  . Arthritis Mother   . Stroke Brother     previous smoker  . Alcohol abuse Brother     in remission  . Leukemia Brother   . Diabetes Paternal Grandmother     Current Outpatient Prescriptions on File Prior to Visit  Medication Sig Dispense Refill  . aspirin 81 MG tablet Take 81 mg by mouth daily.      . bifidobacterium infantis (ALIGN) capsule Take 1 capsule by mouth daily.      . chlorthalidone (HYGROTON) 25 MG tablet Take 1 tablet (25 mg total) by mouth daily.  30 tablet  3  . gabapentin (NEURONTIN) 300 MG capsule 1 tab po bid and 2 qhs  120 capsule  2  . glucose blood test strip 1 each by Other route as needed for other. Check Blood sugar as directed by MD  100 each  2  . insulin glargine (LANTUS) 100 UNIT/ML injection 25 units SQ qam      . Krill Oil CAPS Take 1 capsule by mouth daily.  30 capsule    . lisinopril (PRINIVIL,ZESTRIL) 20 MG tablet Take 1 tablet (20 mg total) by mouth 2 (two) times daily.  60 tablet  1  . metFORMIN (GLUCOPHAGE) 1000 MG tablet 1 tab po bid and 1/2 tab po q noon  75 tablet  5  . metoprolol (TOPROL-XL) 25 MG 24 hr tablet Take 25 mg by mouth daily.        . naproxen sodium (ALEVE) 220 MG tablet Take 220 mg by mouth as needed.      . nebivolol (BYSTOLIC) 5 MG tablet Take 1 tablet (5 mg total) by mouth daily.  35 tablet  0  . ONE TOUCH LANCETS MISC Use as directed to check Blood Sugar  200 each  1  . pregabalin (LYRICA) 50 MG capsule Take 1 capsule (50 mg total) by mouth every morning.  28 capsule  0  . DISCONTD: insulin glargine (LANTUS SOLOSTAR) 100 UNIT/ML injection 18 units SQ qam  5 pen  3    No Known Allergies   Review of Systems (Positive items checked otherwise negative)  General: [ ]  Weight loss, [ ]  Weight gain, [ ]   Loss of appetite, [ ]  Fever  Neurologic: [ ]  Dizziness, [ ]  Blackouts, [ ]  Headaches, [ ]  Seizure  Ear/Nose/Throat: [ ]  Change in eyesight, [ ]  Change in  hearing, [ ]  Nose bleeds, [ ]  Sore throat  Vascular: [x]  Pain in legs with walking, [x]  Pain in feet while lying flat, [ ]  Non-healing ulcer, [ ]  Stroke, [ ]  "Mini stroke", [ ]  Slurred speech, [ ]  Temporary blindness, [ ]  Blood clot in vein, [ ]  Phlebitis  Pulmonary: [ ]  Home oxygen, [ ]  Productive cough, [ ]  Bronchitis, [ ]  Coughing up blood, [ ]  Asthma, [ ]  Wheezing  Musculoskeletal: [ ]  Arthritis, [ ]  Joint pain, [ ]  Muscle pain  Cardiac: [ ]  Chest pain, [ ]  Chest tightness/pressure, [ ]  Shortness of breath when lying flat, [ ]  Shortness of breath with exertion, [ ]  Palpitations, [ ]  Heart murmur, [ ]  Arrythmia, [ ]  Atrial fibrillation  Hematologic: [ ]  Bleeding problems, [ ]  Clotting disorder, [ ]  Anemia  Psychiatric:  [ ]  Depression, [ ]  Anxiety, [ ]  Attention deficit disorder  Gastrointestinal:  [ ]  Black stool,[ ]   Blood in stool, [ ]  Peptic ulcer disease, [ ]  Reflux, [ ]  Hiatal hernia, [ ]  Trouble swallowing, [ ]  Diarrhea, [ ]  Constipation  Urinary:  [ ]  Kidney disease, [ ]  Burning with urination, [ ]  Frequent urination, [ ]  Difficulty urinating  Skin: [ ]  Ulcers, [ ]  Rashes  Physical Examination  Filed Vitals:   04/02/12 1405  BP: 147/97  Pulse: 90  Resp: 20  Height: 5\' 2"  (1.575 m)  Weight: 146 lb (66.225 kg)  SpO2: 100%   Body mass index is 26.70 kg/(m^2).  General: A&O x 3, WDWN  Head: [place]/AT  Ear/Nose/Throat: Hearing grossly intact, nares w/o erythema or drainage, oropharynx w/o Erythema/Exudate  Eyes: PERRLA, EOMI  Neck: Supple, no nuchal rigidity, no palpable LAD  Pulmonary: Sym exp, good air movt, CTAB, no rales, rhonchi, & wheezing  Cardiac: RRR, Nl S1, S2, no Murmurs, rubs or gallops  Vascular: Vessel Right Left  Radial Palpable Palpable  Brachial Palpable Palpable  Carotid Palpable, without bruit Palpable, without bruit  Aorta Non-palpable N/A  Femoral Palpable Palpable  Popliteal Non-palpable Non-palpable  PT Palpable Palpable  DP Palpable  Palpable   Gastrointestinal: soft, NTND, -G/R, - HSM, - masses, - CVAT B  Musculoskeletal: M/S 5/5 throughout , Extremities without ischemic changes , L lateral leg ulcer healed  Neurologic: CN 2-12 intact , Pain and light touch intact in extremities except L lower leg: decreased sensation, Motor exam as listed above  Psychiatric: Judgment intact, Mood &  affect appropriatefor pt's clinical situation  Dermatologic: See M/S exam for extremity exam, no rashes otherwise noted  Lymph : No Cervical, Axillary, or Inguinal lymphadenopathy   Non-Invasive Vascular Imaging  ABI (Date: 04/02/12)  RLE: 1.18, PT & DP: triphasic  LLE: 1.22, PT & DP: triphasic  BLE Arterial Duplex (Date: 04/02/12)  Triphasic flow throughout without hemodynamically significant stenoses  Outside Studies/Documentation 3 pages of outside documents were reviewed including: outpatient clinic chart.  Medical Decision Making  [name] is a 46 y.o. female who presents with: IDDM with peripheral neuropathy without evidence of PAD    At this point, the patient has no evidence of PAD though with IDDM she is at high risk of getting such.  Her sx are primarily related to her neuropathy.  I emphasized repeated the importance of risk factor management and tight control of her chronic medical problems.  I discussed in depth with the patient the nature of atherosclerosis, and emphasized the importance of maximal medical management including strict control of blood pressure, blood glucose, and lipid levels, antiplatelet agent, obtaining regular exercise, and cessation of smoking.    Thank you for allowing [name] to participate in this patient's care.  Leonides Sake, MD [facility] [place] Office: [phone] Pager: [phone]  04/02/2012, 3:02 PM

## 2012-04-02 NOTE — Addendum Note (Signed)
Addended by: Court Joy on: 04/02/2012 09:26 AM   Modules accepted: Orders

## 2012-04-02 NOTE — Procedures (Unsigned)
LOWER EXTREMITY ARTERIAL DUPLEX  INDICATION:  Bilateral calf pain.  HISTORY: Diabetes:  Yes Cardiac:  No Hypertension:  Yes Smoking:  No Previous Surgery:  No  SINGLE LEVEL ARTERIAL EXAM                         RIGHT                LEFT Brachial: Anterior tibial: Posterior tibial: Peroneal: Ankle/Brachial Index:   1.18                 1.22  LOWER EXTREMITY ARTERIAL DUPLEX EXAM  DUPLEX:  Triphasic waveforms throughout the bilateral lower extremity.  IMPRESSION: 1. No evidence of bilateral lower extremity arterial occlusive     disease. 2. See attached.  ___________________________________________ Fransisco Hertz, MD  SS/MEDQ  D:  04/02/2012  T:  04/02/2012  Job:  [id number]

## 2012-04-05 LAB — INTRINSIC FACTOR ANTIBODIES: Intrinsic Factor: NEGATIVE

## 2012-04-09 ENCOUNTER — Encounter: Payer: BC Managed Care – PPO | Admitting: Vascular Surgery

## 2012-04-12 ENCOUNTER — Other Ambulatory Visit: Payer: Self-pay

## 2012-04-12 ENCOUNTER — Encounter (HOSPITAL_BASED_OUTPATIENT_CLINIC_OR_DEPARTMENT_OTHER): Payer: BC Managed Care – PPO

## 2012-04-12 DIAGNOSIS — E119 Type 2 diabetes mellitus without complications: Secondary | ICD-10-CM

## 2012-04-12 DIAGNOSIS — I10 Essential (primary) hypertension: Secondary | ICD-10-CM

## 2012-04-12 MED ORDER — LISINOPRIL 20 MG PO TABS: 20.0000 mg | ORAL_TABLET | Freq: Two times a day (BID) | ORAL | Status: DC

## 2012-04-27 ENCOUNTER — Ambulatory Visit: Payer: BC Managed Care – PPO | Admitting: Cardiovascular Disease

## 2012-06-29 ENCOUNTER — Ambulatory Visit (INDEPENDENT_AMBULATORY_CARE_PROVIDER_SITE_OTHER): Payer: BC Managed Care – PPO

## 2012-06-29 DIAGNOSIS — Z23 Encounter for immunization: Secondary | ICD-10-CM

## 2012-10-16 ENCOUNTER — Other Ambulatory Visit: Payer: Self-pay | Admitting: Family Medicine

## 2012-12-17 ENCOUNTER — Ambulatory Visit (INDEPENDENT_AMBULATORY_CARE_PROVIDER_SITE_OTHER): Payer: BC Managed Care – PPO | Admitting: Family Medicine

## 2012-12-17 ENCOUNTER — Encounter: Payer: Self-pay | Admitting: Family Medicine

## 2012-12-17 VITALS — BP 140/88 | HR 83 | Temp 99.0°F | Ht 63.0 in | Wt 141.8 lb

## 2012-12-17 DIAGNOSIS — E1165 Type 2 diabetes mellitus with hyperglycemia: Secondary | ICD-10-CM

## 2012-12-17 DIAGNOSIS — F329 Major depressive disorder, single episode, unspecified: Secondary | ICD-10-CM

## 2012-12-17 DIAGNOSIS — F3289 Other specified depressive episodes: Secondary | ICD-10-CM

## 2012-12-17 DIAGNOSIS — G609 Hereditary and idiopathic neuropathy, unspecified: Secondary | ICD-10-CM

## 2012-12-17 DIAGNOSIS — E785 Hyperlipidemia, unspecified: Secondary | ICD-10-CM

## 2012-12-17 DIAGNOSIS — R252 Cramp and spasm: Secondary | ICD-10-CM

## 2012-12-17 DIAGNOSIS — IMO0001 Reserved for inherently not codable concepts without codable children: Secondary | ICD-10-CM

## 2012-12-17 DIAGNOSIS — I10 Essential (primary) hypertension: Secondary | ICD-10-CM

## 2012-12-17 DIAGNOSIS — E119 Type 2 diabetes mellitus without complications: Secondary | ICD-10-CM

## 2012-12-17 DIAGNOSIS — F32A Depression, unspecified: Secondary | ICD-10-CM

## 2012-12-17 LAB — RENAL FUNCTION PANEL
Albumin: 3.8 g/dL (ref 3.5–5.2)
BUN: 11 mg/dL (ref 6–23)
CO2: 23 mEq/L (ref 19–32)
Calcium: 9.1 mg/dL (ref 8.4–10.5)
Chloride: 99 mEq/L (ref 96–112)
Creatinine, Ser: 0.5 mg/dL (ref 0.4–1.2)
GFR: 142.66 mL/min (ref >60.00)
Glucose, Bld: 300 mg/dL — ABNORMAL HIGH (ref 70–99)
Phosphorus: 3.2 mg/dL (ref 2.3–4.6)
Potassium: 4 mEq/L (ref 3.5–5.1)
Sodium: 132 mEq/L — ABNORMAL LOW (ref 135–145)

## 2012-12-17 LAB — LIPID PANEL
Cholesterol: 175 mg/dL (ref 0–200)
HDL: 41.4 mg/dL (ref >39.00)
LDL Cholesterol: 120 mg/dL — ABNORMAL HIGH (ref 0–99)
Total CHOL/HDL Ratio: 4
Triglycerides: 69 mg/dL (ref 0.0–149.0)
VLDL: 13.8 mg/dL (ref 0.0–40.0)

## 2012-12-17 LAB — CBC
HCT: 41.4 % (ref 36.0–46.0)
Hemoglobin: 14.1 g/dL (ref 12.0–15.0)
MCHC: 34 g/dL (ref 30.0–36.0)
MCV: 85.6 fl (ref 78.0–100.0)
Platelets: 312 10*3/uL (ref 150.0–400.0)
RBC: 4.84 Mil/uL (ref 3.87–5.11)
RDW: 12.7 % (ref 11.5–14.6)
WBC: 6.8 10*3/uL (ref 4.5–10.5)

## 2012-12-17 LAB — HEPATIC FUNCTION PANEL
ALT: 25 U/L (ref 0–35)
AST: 18 U/L (ref 0–37)
Albumin: 3.8 g/dL (ref 3.5–5.2)
Alkaline Phosphatase: 112 U/L (ref 39–117)
Bilirubin, Direct: 0.1 mg/dL (ref 0.0–0.3)
Total Bilirubin: 0.7 mg/dL (ref 0.3–1.2)
Total Protein: 7.1 g/dL (ref 6.0–8.3)

## 2012-12-17 LAB — HEMOGLOBIN A1C: Hgb A1c MFr Bld: 14.3 % — ABNORMAL HIGH (ref 4.6–6.5)

## 2012-12-17 LAB — TSH: TSH: 2.82 u[IU]/mL (ref 0.35–5.50)

## 2012-12-17 LAB — MAGNESIUM: Magnesium: 1.7 mg/dL (ref 1.5–2.5)

## 2012-12-17 MED ORDER — AMITRIPTYLINE HCL 10 MG PO TABS: ORAL_TABLET | ORAL | Status: DC

## 2012-12-17 MED ORDER — PRAVASTATIN SODIUM 10 MG PO TABS: 10.0000 mg | ORAL_TABLET | Freq: Every day | ORAL | Status: DC

## 2012-12-17 MED ORDER — ASPIRIN EC 81 MG PO TBEC: 81.0000 mg | DELAYED_RELEASE_TABLET | Freq: Every day | ORAL | Status: DC

## 2012-12-17 NOTE — Assessment & Plan Note (Signed)
Normal at today's visit no changes

## 2012-12-17 NOTE — Assessment & Plan Note (Signed)
Very poor control needs to increase her Lantus to 26 units and then to increase by 2 units every 3 days if numbers remain above 140. Minimize carbs

## 2012-12-17 NOTE — Assessment & Plan Note (Addendum)
Has been seen by neurology and they confirmed this is due to sugar and stable. Elavil 10 mg po qhs and can increase to 20 mg as tolerated and as needed.

## 2012-12-17 NOTE — Progress Notes (Signed)
[name], female   DOB: 1965-10-31, 47 y.o.   MRN: [id number] [name] [id number] Jan 31, 1966 12/17/2012      Progress Note-Follow Up  Subjective  Chief Complaint  Chief Complaint  Patient presents with  . Annual Exam    physical    HPI  Caucasian female who is in today for followup. She's not been in quite some time he continues to struggle with significant pain and peripheral neuropathy in her feet and legs. Sugars unfortunately continued to be poorly controlled and she does not check them frequently. She denies polyuria or polydipsia but does struggle with fatigue and depression. She has had a lot of work and family stressors recently and acknowledges her mood is low but denies suicidal ideation. No recent illness. No fevers or chills. No chest pain, palpitations, shortness of breath, GI or GU concerns noted.  Past Medical History  Diagnosis Date  . Hypertension   . Hyperlipidemia 12/06/2010  . Overweight 12/06/2010  . NEPHROLITHIASIS, HX OF 10/07/2010  . RESTLESS LEG SYNDROME 10/25/2010  . PERIPHERAL NEUROPATHY, FEET 10/07/2010  . HEART MURMUR, HX OF 10/07/2010  . Essential hypertension, benign 10/07/2010  . DM 10/07/2010  . Disturbance of skin sensation 10/07/2010  . COMMON MIGRAINE 10/07/2010  . Acute bronchitis 07/08/2011  . Diabetes mellitus type II, uncontrolled 10/07/2010    Qualifier: Diagnosis of  By: Abner Greenspan MD, Misty Stanley    . PVD (peripheral vascular disease) 01/21/2012  . SOB (shortness of breath) 04/01/2012    History reviewed. No pertinent past surgical history.  Family History  Problem Relation Age of Onset  . Arthritis Mother   . Stroke Brother     previous smoker  . Alcohol abuse Brother     in remission  . Leukemia Brother   . Diabetes Paternal Grandmother     History   Social History  . Marital Status: Married    Spouse Name: N/A    Number of Children: N/A  . Years of Education: N/A   Occupational History  . Not on file.    Social History Main Topics  . Smoking status: Former Smoker -- 0.50 packs/day    Quit date: 09/08/2005  . Smokeless tobacco: Never Used  . Alcohol Use: No     Comment: occasional  . Drug Use: No  . Sexually Active: Yes -- Female partner(s)   Other Topics Concern  . Not on file   Social History Narrative  . No narrative on file    Current Outpatient Prescriptions on File Prior to Visit  Medication Sig Dispense Refill  . aspirin 81 MG tablet Take 81 mg by mouth daily.      . bifidobacterium infantis (ALIGN) capsule Take 1 capsule by mouth daily.      . chlorthalidone (HYGROTON) 25 MG tablet Take 1 tablet (25 mg total) by mouth daily.  30 tablet  3  . glucose blood test strip 1 each by Other route as needed for other. Check Blood sugar as directed by MD  100 each  2  . HYDROcodone-acetaminophen (VICODIN) 5-500 MG per tablet       . insulin glargine (LANTUS) 100 UNIT/ML injection 25 units SQ qam      . Krill Oil CAPS Take 1 capsule by mouth daily.  30 capsule    . Lancets (ONETOUCH ULTRASOFT) lancets       . lisinopril (PRINIVIL,ZESTRIL) 20 MG tablet Take 1 tablet (20 mg total) by mouth 2 (two) times daily.  60  tablet  10  . metFORMIN (GLUCOPHAGE) 1000 MG tablet 1 tab po bid and 1/2 tab po q noon  75 tablet  5  . metoprolol (TOPROL-XL) 25 MG 24 hr tablet Take 25 mg by mouth daily.        . naproxen sodium (ALEVE) 220 MG tablet Take 220 mg by mouth as needed.      . ONE TOUCH LANCETS MISC Use as directed to check Blood Sugar  200 each  1  . pregabalin (LYRICA) 50 MG capsule Take 1 capsule (50 mg total) by mouth every morning.  28 capsule  0   No current facility-administered medications on file prior to visit.    No Known Allergies  Review of Systems  Review of Systems  Constitutional: Negative for fever and malaise/fatigue.  HENT: Negative for congestion.   Eyes: Negative for discharge.  Respiratory: Negative for shortness of breath.   Cardiovascular: Negative for chest  pain, palpitations and leg swelling.  Gastrointestinal: Negative for nausea, abdominal pain and diarrhea.  Genitourinary: Negative for dysuria.  Musculoskeletal: Negative for falls.  Skin: Negative for rash.  Neurological: Positive for tingling and sensory change. Negative for loss of consciousness and headaches.  Endo/Heme/Allergies: Negative for polydipsia.  Psychiatric/Behavioral: Negative for depression and suicidal ideas. The patient is not nervous/anxious and does not have insomnia.     Objective  BP 140/88  Pulse 83  Temp(Src) 99 F (37.2 C) (Temporal)  Ht 5\' 3"  (1.6 m)  Wt 141 lb 12.8 oz (64.32 kg)  BMI 25.13 kg/m2  SpO2 99%  Physical Exam  Physical Exam  Constitutional: She is oriented to person, place, and time and well-developed, well-nourished, and in no distress. No distress.  HENT:  Head: Normocephalic and atraumatic.  Eyes: Conjunctivae are normal.  Neck: Neck supple. No thyromegaly present.  Cardiovascular: Normal rate, regular rhythm and normal heart sounds.   No murmur heard. Pulmonary/Chest: Effort normal and breath sounds normal. She has no wheezes.  Abdominal: She exhibits no distension and no mass.  Musculoskeletal: She exhibits no edema.  Lymphadenopathy:    She has no cervical adenopathy.  Neurological: She is alert and oriented to person, place, and time.  Skin: Skin is warm and dry. No rash noted. She is not diaphoretic.  Psychiatric: Memory, affect and judgment normal.    Lab Results  Component Value Date   TSH 2.82 12/17/2012   Lab Results  Component Value Date   WBC 6.8 12/17/2012   HGB 14.1 12/17/2012   HCT 41.4 12/17/2012   MCV 85.6 12/17/2012   PLT 312.0 12/17/2012   Lab Results  Component Value Date   CREATININE 0.5 12/17/2012   BUN 11 12/17/2012   NA 132* 12/17/2012   K 4.0 12/17/2012   CL 99 12/17/2012   CO2 23 12/17/2012   Lab Results  Component Value Date   ALT 25 12/17/2012   AST 18 12/17/2012   ALKPHOS 112 12/17/2012   BILITOT  0.7 12/17/2012   Lab Results  Component Value Date   CHOL 175 12/17/2012   Lab Results  Component Value Date   HDL 41.40 12/17/2012   Lab Results  Component Value Date   LDLCALC 120* 12/17/2012   Lab Results  Component Value Date   TRIG 69.0 12/17/2012   Lab Results  Component Value Date   CHOLHDL 4 12/17/2012     Assessment & Plan  PERIPHERAL NEUROPATHY, FEET Has been seen by neurology and they confirmed this is due to sugar and  stable. No new meds.  ESSENTIAL HYPERTENSION, BENIGN Normal at today's visit no changes  Diabetes mellitus type II, uncontrolled Very poor control needs to increase her Lantus to 26 units and then to increase by 2 units every 3 days if numbers remain above 140. Minimize carbs  Hyperlipidemia Start Pravastatin and avoid trans fats.

## 2012-12-17 NOTE — Patient Instructions (Addendum)
Diabetes and Exercise  Regular exercise is important and can help:   · Control blood glucose (sugar).  · Decrease blood pressure.  ·   · Control blood lipids (cholesterol, triglycerides).  · Improve overall health.  BENEFITS FROM EXERCISE  · Improved fitness.  · Improved flexibility.  · Improved endurance.  · Increased bone density.  · Weight control.  · Increased muscle strength.  · Decreased body fat.  · Improvement of the body's use of insulin, a hormone.  · Increased insulin sensitivity.  · Reduction of insulin needs.  · Reduced stress and tension.  · Helps you feel better.  People with diabetes who add exercise to their lifestyle gain additional benefits, including:  · Weight loss.  · Reduced appetite.  · Improvement of the body's use of blood glucose.  · Decreased risk factors for heart disease:  · Lowering of cholesterol and triglycerides.  · Raising the level of good cholesterol (high-density lipoproteins, HDL).  · Lowering blood sugar.  · Decreased blood pressure.  TYPE 1 DIABETES AND EXERCISE  · Exercise will usually lower your blood glucose.  · If blood glucose is greater than 240 mg/dl, check urine ketones. If ketones are present, do not exercise.  · Location of the insulin injection sites may need to be adjusted with exercise. Avoid injecting insulin into areas of the body that will be exercised. For example, avoid injecting insulin into:  · The arms when playing tennis.  · The legs when jogging. For more information, discuss this with your caregiver.  · Keep a record of:  · Food intake.  · Type and amount of exercise.  · Expected peak times of insulin action.  · Blood glucose levels.  Do this before, during, and after exercise. Review your records with your caregiver. This will help you to develop guidelines for adjusting food intake and insulin amounts.   TYPE 2 DIABETES AND EXERCISE  · Regular physical activity can help control blood glucose.  · Exercise is important because it may:  · Increase the  body's sensitivity to insulin.  · Improve blood glucose control.  · Exercise reduces the risk of heart disease. It decreases serum cholesterol and triglycerides. It also lowers blood pressure.  · Those who take insulin or oral hypoglycemic agents should watch for signs of hypoglycemia. These signs include dizziness, shaking, sweating, chills, and confusion.  · Body water is lost during exercise. It must be replaced. This will help to avoid loss of body fluids (dehydration) or heat stroke.  Be sure to talk to your caregiver before starting an exercise program to make sure it is safe for you. Remember, any activity is better than none.   Document Released: 11/15/2003 Document Revised: 11/17/2011 Document Reviewed: 03/01/2009  [employer]® Patient Information ©2013 [employer].

## 2012-12-17 NOTE — Assessment & Plan Note (Signed)
Start Pravastatin and avoid trans fats.

## 2012-12-18 ENCOUNTER — Encounter: Payer: Self-pay | Admitting: Family Medicine

## 2012-12-18 DIAGNOSIS — F329 Major depressive disorder, single episode, unspecified: Secondary | ICD-10-CM | POA: Insufficient documentation

## 2012-12-18 DIAGNOSIS — F32A Depression, unspecified: Secondary | ICD-10-CM | POA: Insufficient documentation

## 2012-12-18 HISTORY — DX: Depression, unspecified: F32.A

## 2012-12-18 NOTE — Assessment & Plan Note (Signed)
Patient notes depressed mood with increased stressors. Will start Elavil qhs and reassess at next visitl

## 2013-03-04 ENCOUNTER — Encounter: Payer: Self-pay | Admitting: Family Medicine

## 2013-03-04 ENCOUNTER — Ambulatory Visit (INDEPENDENT_AMBULATORY_CARE_PROVIDER_SITE_OTHER): Payer: BC Managed Care – PPO | Admitting: Family Medicine

## 2013-03-04 VITALS — BP 124/80 | HR 85 | Temp 98.5°F | Ht 63.0 in | Wt 139.0 lb

## 2013-03-04 DIAGNOSIS — IMO0001 Reserved for inherently not codable concepts without codable children: Secondary | ICD-10-CM

## 2013-03-04 DIAGNOSIS — I10 Essential (primary) hypertension: Secondary | ICD-10-CM

## 2013-03-04 DIAGNOSIS — L97909 Non-pressure chronic ulcer of unspecified part of unspecified lower leg with unspecified severity: Secondary | ICD-10-CM

## 2013-03-04 DIAGNOSIS — E11622 Type 2 diabetes mellitus with other skin ulcer: Secondary | ICD-10-CM

## 2013-03-04 DIAGNOSIS — G609 Hereditary and idiopathic neuropathy, unspecified: Secondary | ICD-10-CM

## 2013-03-04 DIAGNOSIS — E1169 Type 2 diabetes mellitus with other specified complication: Secondary | ICD-10-CM

## 2013-03-04 DIAGNOSIS — E1165 Type 2 diabetes mellitus with hyperglycemia: Secondary | ICD-10-CM

## 2013-03-04 DIAGNOSIS — IMO0002 Reserved for concepts with insufficient information to code with codable children: Secondary | ICD-10-CM

## 2013-03-04 NOTE — Patient Instructions (Addendum)
Labs prior to visit lipid, renal, tsh, hgba1c, hepatic, cbc   Peripheral Neuropathy Peripheral neuropathy is a common disorder of your nerves resulting from damage. CAUSES  This disorder may be caused by a disease of the nerves or illness. Many neuropathies have well known causes such as:  Diabetes. This is one of the most common causes.   Uremia.   AIDS.   Nutritional deficiencies.   Other causes include mechanical pressures. These may be from:   Compression.   Injury.   Contusions or bruises.   Fracture or dislocated bones.   Pressure involving the nerves close to the surface. Nerves such as the ulnar, or radial can be injured by prolonged use of crutches.  Other injuries may come from:  Tumor.   Hemorrhage or bleeding into a nerve.   Exposure to cold or radiation.   Certain medicines or toxic substances (rare).   Vascular or collagen disorders such as:   Atherosclerosis.   Systemic lupus erythematosus.   Scleroderma.   Sarcoidosis.   Rheumatoid arthritis.   Polyarteritis nodosa.   A large number of cases are of unknown cause.  SYMPTOMS  Common problems include:  Weakness.   Numbness.   Abnormal sensations (paresthesia) such as:   Burning.   Tickling.   Pricking.   Tingling.   Pain in the arms, hands, legs and/or feet.  TREATMENT  Therapy for this disorder differs depending on the cause. It may vary from medical treatment with medications or physical therapy among others.   For example, therapy for this disorder caused by diabetes involves control of the diabetes.   In cases where a tumor or ruptured disc is the cause, therapy may involve surgery. This would be to remove the tumor or to repair the ruptured disc.   In entrapment or compression neuropathy, treatment may consist of splinting or surgical decompression of the ulnar or median nerves. A common example of entrapment neuropathy is carpal tunnel syndrome. This has become more  common because of the increasing use of computers.   Peroneal and radial compression neuropathies may require avoidance of pressure.   Physical therapy and/or splints may be useful in preventing contractures. This is a condition in which shortened muscles around joints cause abnormal and sometimes painful positioning of the joints.  Document Released: 08/15/2002 Document Revised: 05/07/2011 Document Reviewed: 08/25/2005 [facility] Patient Information 2012 [place], [place].

## 2013-03-06 ENCOUNTER — Encounter: Payer: Self-pay | Admitting: Family Medicine

## 2013-03-06 NOTE — Assessment & Plan Note (Signed)
She reports a great improvement in her sugars as she has titrated up her Lantus to 100 units, her most recent blood sugar was 130. No poly uria or polydipsia. Check hgba1c next month to further assist.

## 2013-03-06 NOTE — Progress Notes (Signed)
[name], female   DOB: 04-04-1966, 47 y.o.   MRN: [id number] GINNI EICHLER [id number] Feb 02, 1966 03/06/2013      Progress Note-Follow Up  Subjective  Chief Complaint  Chief Complaint  Patient presents with  . FMLA paperwork    HPI  Patient is a 47 year old Caucasian female who is in today to have her FMLA forms filled out. She works full-time and this is very little but does be performed annually. She has very poorly controlled blood sugars historically although the Lantus 100 units daily has been very helpful she notes her last blood sugar was 1:30. She denies. Continues to struggle with peripheral neuropathy. Has significant pain and tingling in  Bilateral lower extremities no open sores or recent illness. No chest pain, palpitations, shortness of breath, GI or GU concerns are noted today.  Past Medical History  Diagnosis Date  . Hypertension   . Hyperlipidemia 12/06/2010  . Overweight(278.02) 12/06/2010  . NEPHROLITHIASIS, HX OF 10/07/2010  . RESTLESS LEG SYNDROME 10/25/2010  . PERIPHERAL NEUROPATHY, FEET 10/07/2010  . HEART MURMUR, HX OF 10/07/2010  . Essential hypertension, benign 10/07/2010  . DM 10/07/2010  . Disturbance of skin sensation 10/07/2010  . COMMON MIGRAINE 10/07/2010  . Acute bronchitis 07/08/2011  . Diabetes mellitus type II, uncontrolled 10/07/2010    Qualifier: Diagnosis of  By: Abner Greenspan MD, Misty Stanley    . PVD (peripheral vascular disease) 01/21/2012  . SOB (shortness of breath) 04/01/2012  . Depression 12/18/2012    History reviewed. No pertinent past surgical history.  Family History  Problem Relation Age of Onset  . Arthritis Mother   . Stroke Brother     previous smoker  . Alcohol abuse Brother     in remission  . Leukemia Brother   . Diabetes Paternal Grandmother     History   Social History  . Marital Status: Married    Spouse Name: N/A    Number of Children: N/A  . Years of Education: N/A   Occupational History  . Not on file.    Social History Main Topics  . Smoking status: Former Smoker -- 0.50 packs/day    Quit date: 09/08/2005  . Smokeless tobacco: Never Used  . Alcohol Use: No     Comment: occasional  . Drug Use: No  . Sexually Active: Yes -- Female partner(s)   Other Topics Concern  . Not on file   Social History Narrative  . No narrative on file    Current Outpatient Prescriptions on File Prior to Visit  Medication Sig Dispense Refill  . amitriptyline (ELAVIL) 10 MG tablet 1-2 tabs po qhs as directed  60 tablet  3  . chlorthalidone (HYGROTON) 25 MG tablet Take 1 tablet (25 mg total) by mouth daily.  30 tablet  3  . glucose blood test strip 1 each by Other route as needed for other. Check Blood sugar as directed by MD  100 each  2  . HYDROcodone-acetaminophen (VICODIN) 5-500 MG per tablet       . insulin glargine (LANTUS) 100 UNIT/ML injection 25 units SQ qam      . Krill Oil CAPS Take 1 capsule by mouth daily.  30 capsule    . Lancets (ONETOUCH ULTRASOFT) lancets       . lisinopril (PRINIVIL,ZESTRIL) 20 MG tablet Take 1 tablet (20 mg total) by mouth 2 (two) times daily.  60 tablet  10  . metFORMIN (GLUCOPHAGE) 1000 MG tablet 1 tab po bid and 1/2  tab po q noon  75 tablet  5  . metoprolol (TOPROL-XL) 25 MG 24 hr tablet Take 25 mg by mouth daily.        . ONE TOUCH LANCETS MISC Use as directed to check Blood Sugar  200 each  1  . pravastatin (PRAVACHOL) 10 MG tablet Take 1 tablet (10 mg total) by mouth daily.  30 tablet  3  . pregabalin (LYRICA) 50 MG capsule Take 1 capsule (50 mg total) by mouth every morning.  28 capsule  0  . aspirin EC 81 MG tablet Take 1 tablet (81 mg total) by mouth daily.  30 tablet     No current facility-administered medications on file prior to visit.    No Known Allergies  Review of Systems  Review of Systems  Constitutional: Positive for malaise/fatigue. Negative for fever.  HENT: Negative for congestion.   Eyes: Negative for pain and discharge.  Respiratory:  Negative for shortness of breath.   Cardiovascular: Negative for chest pain, palpitations and leg swelling.  Gastrointestinal: Negative for nausea, abdominal pain and diarrhea.  Genitourinary: Negative for dysuria.  Musculoskeletal: Negative for falls.  Skin: Negative for rash.  Neurological: Positive for tingling and sensory change. Negative for loss of consciousness and headaches.  Endo/Heme/Allergies: Negative for polydipsia.  Psychiatric/Behavioral: Negative for depression and suicidal ideas. The patient is not nervous/anxious and does not have insomnia.     Objective  BP 124/80  Pulse 85  Temp(Src) 98.5 F (36.9 C) (Oral)  Ht 5\' 3"  (1.6 m)  Wt 139 lb (63.05 kg)  BMI 24.63 kg/m2  SpO2 96%  Physical Exam  Physical Exam  Constitutional: She is oriented to person, place, and time and well-developed, well-nourished, and in no distress. No distress.  HENT:  Head: Normocephalic and atraumatic.  Eyes: Conjunctivae are normal.  Neck: Neck supple. No thyromegaly present.  Cardiovascular: Normal rate and regular rhythm.  Exam reveals no gallop.   No murmur heard. Pulmonary/Chest: Effort normal and breath sounds normal. She has no wheezes.  Abdominal: She exhibits no distension and no mass.  Musculoskeletal: She exhibits no edema.  Lymphadenopathy:    She has no cervical adenopathy.  Neurological: She is alert and oriented to person, place, and time.  Skin: Skin is warm and dry. No rash noted. She is not diaphoretic.  Psychiatric: Memory, affect and judgment normal.    Lab Results  Component Value Date   TSH 2.82 12/17/2012   Lab Results  Component Value Date   WBC 6.8 12/17/2012   HGB 14.1 12/17/2012   HCT 41.4 12/17/2012   MCV 85.6 12/17/2012   PLT 312.0 12/17/2012   Lab Results  Component Value Date   CREATININE 0.5 12/17/2012   BUN 11 12/17/2012   NA 132* 12/17/2012   K 4.0 12/17/2012   CL 99 12/17/2012   CO2 23 12/17/2012   Lab Results  Component Value Date   ALT 25  12/17/2012   AST 18 12/17/2012   ALKPHOS 112 12/17/2012   BILITOT 0.7 12/17/2012   Lab Results  Component Value Date   CHOL 175 12/17/2012   Lab Results  Component Value Date   HDL 41.40 12/17/2012   Lab Results  Component Value Date   LDLCALC 120* 12/17/2012   Lab Results  Component Value Date   TRIG 69.0 12/17/2012   Lab Results  Component Value Date   CHOLHDL 4 12/17/2012     Assessment & Plan  ESSENTIAL HYPERTENSION, BENIGN Well  Controlled, no  changes.   Diabetes mellitus type II, uncontrolled She reports a great improvement in her sugars as she has titrated up her Lantus to 100 units, her most recent blood sugar was 130. No poly uria or polydipsia. Check hgba1c next month to further assist.   PERIPHERAL NEUROPATHY, FEET Persistent but does not tolerate medications to treat it well. She will proceed without medication  Diabetic ulcer of lower extremity Well healed at this time

## 2013-03-06 NOTE — Assessment & Plan Note (Signed)
Well healed at this time

## 2013-03-06 NOTE — Assessment & Plan Note (Signed)
Well  Controlled, no changes.

## 2013-03-06 NOTE — Assessment & Plan Note (Signed)
Persistent but does not tolerate medications to treat it well. She will proceed without medication

## 2013-03-08 ENCOUNTER — Encounter: Payer: Self-pay | Admitting: Family

## 2013-03-08 ENCOUNTER — Ambulatory Visit (INDEPENDENT_AMBULATORY_CARE_PROVIDER_SITE_OTHER): Payer: BC Managed Care – PPO | Admitting: Family

## 2013-03-08 VITALS — BP 160/92 | HR 115 | Temp 98.6°F | Resp 16 | Wt 136.1 lb

## 2013-03-08 DIAGNOSIS — M549 Dorsalgia, unspecified: Secondary | ICD-10-CM

## 2013-03-08 DIAGNOSIS — J309 Allergic rhinitis, unspecified: Secondary | ICD-10-CM

## 2013-03-08 MED ORDER — METHYLPREDNISOLONE 4 MG PO KIT
PACK | ORAL | Status: DC
Start: 2013-03-08 — End: 2013-03-25

## 2013-03-08 MED ORDER — CYCLOBENZAPRINE HCL 5 MG PO TABS
5.0000 mg | ORAL_TABLET | Freq: Every evening | ORAL | Status: DC | PRN
Start: 2013-03-08 — End: 2014-01-06

## 2013-03-08 NOTE — Assessment & Plan Note (Signed)
Trial of HS flexeril, prednisone.

## 2013-03-08 NOTE — Progress Notes (Signed)
Subjective:    [name], female    DOB: 21-Feb-1966, 47 y.o.   MRN: [id number]  HPI  [name] is a 47 yr old female who presents today with chief complaint of cough.  She reports that cough started 2 days ago. Cough is associated with back pain which she reports is worsening.  Today she is having difficulty bending forward. Denies previous hx of back problems.  Has had nasal congestion for 1 week.  Clear nasal discharge. She denies associated fever.  Only taking tylenol.      Review of Systems See HPI  Past Medical History  Diagnosis Date  . Hypertension   . Hyperlipidemia 12/06/2010  . Overweight(278.02) 12/06/2010  . NEPHROLITHIASIS, HX OF 10/07/2010  . RESTLESS LEG SYNDROME 10/25/2010  . PERIPHERAL NEUROPATHY, FEET 10/07/2010  . HEART MURMUR, HX OF 10/07/2010  . Essential hypertension, benign 10/07/2010  . DM 10/07/2010  . Disturbance of skin sensation 10/07/2010  . COMMON MIGRAINE 10/07/2010  . Acute bronchitis 07/08/2011  . Diabetes mellitus type II, uncontrolled 10/07/2010    Qualifier: Diagnosis of  By: Abner Greenspan MD, Misty Stanley    . PVD (peripheral vascular disease) 01/21/2012  . SOB (shortness of breath) 04/01/2012  . Depression 12/18/2012    History   Social History  . Marital Status: Married    Spouse Name: N/A    Number of Children: N/A  . Years of Education: N/A   Occupational History  . Not on file.   Social History Main Topics  . Smoking status: Former Smoker -- 0.50 packs/day    Quit date: 09/08/2005  . Smokeless tobacco: Never Used  . Alcohol Use: No     Comment: occasional  . Drug Use: No  . Sexually Active: Yes -- Female partner(s)   Other Topics Concern  . Not on file   Social History Narrative  . No narrative on file    No past surgical history on file.  Family History  Problem Relation Age of Onset  . Arthritis Mother   . Stroke Brother     previous smoker  . Alcohol abuse Brother     in remission  . Leukemia Brother   .  Diabetes Paternal Grandmother     No Known Allergies  Current Outpatient Prescriptions on File Prior to Visit  Medication Sig Dispense Refill  . amitriptyline (ELAVIL) 10 MG tablet 1-2 tabs po qhs as directed  60 tablet  3  . aspirin EC 81 MG tablet Take 1 tablet (81 mg total) by mouth daily.  30 tablet    . chlorthalidone (HYGROTON) 25 MG tablet Take 1 tablet (25 mg total) by mouth daily.  30 tablet  3  . glucose blood test strip 1 each by Other route as needed for other. Check Blood sugar as directed by MD  100 each  2  . HYDROcodone-acetaminophen (VICODIN) 5-500 MG per tablet       . insulin glargine (LANTUS) 100 UNIT/ML injection 25 units SQ qam      . Krill Oil CAPS Take 1 capsule by mouth daily.  30 capsule    . Lancets (ONETOUCH ULTRASOFT) lancets       . lisinopril (PRINIVIL,ZESTRIL) 20 MG tablet Take 1 tablet (20 mg total) by mouth 2 (two) times daily.  60 tablet  10  . metFORMIN (GLUCOPHAGE) 1000 MG tablet 1 tab po bid and 1/2 tab po q noon  75 tablet  5  . metoprolol (TOPROL-XL) 25 MG 24  hr tablet Take 25 mg by mouth daily.        . ONE TOUCH LANCETS MISC Use as directed to check Blood Sugar  200 each  1  . pravastatin (PRAVACHOL) 10 MG tablet Take 1 tablet (10 mg total) by mouth daily.  30 tablet  3  . pregabalin (LYRICA) 50 MG capsule Take 1 capsule (50 mg total) by mouth every morning.  28 capsule  0   No current facility-administered medications on file prior to visit.    BP 160/92  Pulse 115  Temp(Src) 98.6 F (37 C) (Oral)  Resp 16  Wt 136 lb 1.3 oz (61.725 kg)  BMI 24.11 kg/m2  SpO2 99%       Objective:   Physical Exam  Constitutional: She is oriented to person, place, and time. She appears well-developed and well-nourished. No distress.  HENT:  Head: Normocephalic and atraumatic.  Left Ear: Tympanic membrane and ear canal normal.  Mouth/Throat: No oropharyngeal exudate, posterior oropharyngeal edema or posterior oropharyngeal erythema.  R TM occluded by  cerumen  Cardiovascular: Normal rate and regular rhythm.   No murmur heard. Pulmonary/Chest: Effort normal and breath sounds normal. No respiratory distress. She has no wheezes. She has no rales. She exhibits no tenderness.  Neurological: She is alert and oriented to person, place, and time.  Psychiatric: She has a normal mood and affect. Her behavior is normal. Judgment and thought content normal.          Assessment & Plan:

## 2013-03-08 NOTE — Patient Instructions (Addendum)
Start medrol dose pak.  Monitor your sugars while you are on this medication. Call if sugar >300. Start claritin 10mg  once daily. You may take Delsym as needed for cough this is available over the counter.  Call if you develop fever, if symptoms worsen, or if not improved in 1 week.

## 2013-03-10 DIAGNOSIS — J309 Allergic rhinitis, unspecified: Secondary | ICD-10-CM | POA: Insufficient documentation

## 2013-03-10 NOTE — Assessment & Plan Note (Signed)
Trial of claritin and prn delsym

## 2013-03-25 ENCOUNTER — Ambulatory Visit: Payer: BC Managed Care – PPO | Admitting: Family

## 2013-03-25 ENCOUNTER — Ambulatory Visit (INDEPENDENT_AMBULATORY_CARE_PROVIDER_SITE_OTHER): Payer: BC Managed Care – PPO | Admitting: Family Medicine

## 2013-03-25 ENCOUNTER — Encounter: Payer: Self-pay | Admitting: Family Medicine

## 2013-03-25 VITALS — BP 124/80 | HR 81 | Temp 98.3°F | Ht 63.0 in | Wt 137.1 lb

## 2013-03-25 DIAGNOSIS — IMO0001 Reserved for inherently not codable concepts without codable children: Secondary | ICD-10-CM

## 2013-03-25 DIAGNOSIS — E1165 Type 2 diabetes mellitus with hyperglycemia: Secondary | ICD-10-CM

## 2013-03-25 DIAGNOSIS — M549 Dorsalgia, unspecified: Secondary | ICD-10-CM

## 2013-03-25 DIAGNOSIS — I10 Essential (primary) hypertension: Secondary | ICD-10-CM

## 2013-03-25 MED ORDER — IBUPROFEN 400 MG PO TABS: 400.0000 mg | ORAL_TABLET | Freq: Two times a day (BID) | ORAL | Status: DC | PRN

## 2013-03-25 NOTE — Patient Instructions (Addendum)
Salon pas patches and/or cream to back for work shifts  Pap/gyn at next visit   Back Pain, Adult Low back pain is very common. About 1 in 5 people have back pain.The cause of low back pain is rarely dangerous. The pain often gets better over time.About half of people with a sudden onset of back pain feel better in just 2 weeks. About 8 in 10 people feel better by 6 weeks.  CAUSES Some common causes of back pain include:  Strain of the muscles or ligaments supporting the spine.  Wear and tear (degeneration) of the spinal discs.  Arthritis.  Direct injury to the back. DIAGNOSIS Most of the time, the direct cause of low back pain is not known.However, back pain can be treated effectively even when the exact cause of the pain is unknown.Answering your caregiver's questions about your overall health and symptoms is one of the most accurate ways to make sure the cause of your pain is not dangerous. If your caregiver needs more information, he or she may order lab work or imaging tests (X-rays or MRIs).However, even if imaging tests show changes in your back, this usually does not require surgery. HOME CARE INSTRUCTIONS For many people, back pain returns.Since low back pain is rarely dangerous, it is often a condition that people can learn to [facility] their own.   Remain active. It is stressful on the back to sit or stand in one place. Do not sit, drive, or stand in one place for more than 30 minutes at a time. Take short walks on level surfaces as soon as pain allows.Try to increase the length of time you walk each day.  Do not stay in bed.Resting more than 1 or 2 days can delay your recovery.  Do not avoid exercise or work.Your body is made to move.It is not dangerous to be active, even though your back may hurt.Your back will likely heal faster if you return to being active before your pain is gone.  Pay attention to your body when you bend and lift. Many people have less  discomfortwhen lifting if they bend their knees, keep the load close to their bodies,and avoid twisting. Often, the most comfortable positions are those that put less stress on your recovering back.  Find a comfortable position to sleep. Use a firm mattress and lie on your side with your knees slightly bent. If you lie on your back, put a pillow under your knees.  Only take over-the-counter or prescription medicines as directed by your caregiver. Over-the-counter medicines to reduce pain and inflammation are often the most helpful.Your caregiver may prescribe muscle relaxant drugs.These medicines help dull your pain so you can more quickly return to your normal activities and healthy exercise.  Put ice on the injured area.  Put ice in a plastic bag.  Place a towel between your skin and the bag.  Leave the ice on for 15-20 minutes, 3-4 times a day for the first 2 to 3 days. After that, ice and heat may be alternated to reduce pain and spasms.  Ask your caregiver about trying back exercises and gentle massage. This may be of some benefit.  Avoid feeling anxious or stressed.Stress increases muscle tension and can worsen back pain.It is important to recognize when you are anxious or stressed and learn ways to manage it.Exercise is a great option. SEEK MEDICAL CARE IF:  You have pain that is not relieved with rest or medicine.  You have pain that does not  improve in 1 week.  You have new symptoms.  You are generally not feeling well. SEEK IMMEDIATE MEDICAL CARE IF:   You have pain that radiates from your back into your legs.  You develop new bowel or bladder control problems.  You have unusual weakness or numbness in your arms or legs.  You develop nausea or vomiting.  You develop abdominal pain.  You feel faint. Document Released: 08/25/2005 Document Revised: 02/24/2012 Document Reviewed: 01/13/2011 [facility] Patient Information 2014 [place], [place].

## 2013-03-26 ENCOUNTER — Encounter: Payer: Self-pay | Admitting: Family Medicine

## 2013-03-26 LAB — URINALYSIS
Bilirubin Urine: NEGATIVE
Glucose, UA: >1000 mg/dL — AB
Hgb urine dipstick: NEGATIVE
Ketones, ur: NEGATIVE mg/dL
Leukocytes, UA: NEGATIVE
Nitrite: NEGATIVE
Protein, ur: NEGATIVE mg/dL
Specific Gravity, Urine: >1.03 — ABNORMAL HIGH (ref 1.005–1.030)
Urobilinogen, UA: 1 mg/dL (ref 0.0–1.0)
pH: 7 (ref 5.0–8.0)

## 2013-03-26 NOTE — Assessment & Plan Note (Signed)
Prednisone was helpful when she was taking it but pain is worse again without it. Will check xray and start Ibuprofen 400 mg po in am and cyclobenzaprine 5 mg qhs, try moist heat and stretching. If no improvement may need referral to orthopaedics

## 2013-03-26 NOTE — Progress Notes (Signed)
[name], female   DOB: 08/15/1966, 47 y.o.   MRN: [id number] [name] [id number] 28-May-1966 03/26/2013      Progress Note-Follow Up  Subjective  Chief Complaint  Chief Complaint  Patient presents with  . Follow-up    HPI  Patient is a 47 year old Caucasian female who is here today to discuss her back pain. She was given prednisone at his last week for back pain and that helped a great deal but as she has stopped the prednisone the pain is increasing again. No radicular symptoms. This stays in her low back. No incontinence. No GI complaints of fevers or chills no falls or trauma. No shortness of breath, chest pain or palpitations.  Past Medical History  Diagnosis Date  . Hypertension   . Hyperlipidemia 12/06/2010  . Overweight(278.02) 12/06/2010  . NEPHROLITHIASIS, HX OF 10/07/2010  . RESTLESS LEG SYNDROME 10/25/2010  . PERIPHERAL NEUROPATHY, FEET 10/07/2010  . HEART MURMUR, HX OF 10/07/2010  . Essential hypertension, benign 10/07/2010  . DM 10/07/2010  . Disturbance of skin sensation 10/07/2010  . COMMON MIGRAINE 10/07/2010  . Acute bronchitis 07/08/2011  . Diabetes mellitus type II, uncontrolled 10/07/2010    Qualifier: Diagnosis of  By: Abner Greenspan MD, Misty Stanley    . PVD (peripheral vascular disease) 01/21/2012  . SOB (shortness of breath) 04/01/2012  . Depression 12/18/2012    History reviewed. No pertinent past surgical history.  Family History  Problem Relation Age of Onset  . Arthritis Mother   . Stroke Brother     previous smoker  . Alcohol abuse Brother     in remission  . Leukemia Brother   . Diabetes Paternal Grandmother     History   Social History  . Marital Status: Married    Spouse Name: N/A    Number of Children: N/A  . Years of Education: N/A   Occupational History  . Not on file.   Social History Main Topics  . Smoking status: Former Smoker -- 0.50 packs/day    Quit date: 09/08/2005  . Smokeless tobacco: Never Used  . Alcohol Use:  No     Comment: occasional  . Drug Use: No  . Sexually Active: Yes -- Female partner(s)   Other Topics Concern  . Not on file   Social History Narrative  . No narrative on file    Current Outpatient Prescriptions on File Prior to Visit  Medication Sig Dispense Refill  . amitriptyline (ELAVIL) 10 MG tablet 1-2 tabs po qhs as directed  60 tablet  3  . aspirin EC 81 MG tablet Take 1 tablet (81 mg total) by mouth daily.  30 tablet    . chlorthalidone (HYGROTON) 25 MG tablet Take 1 tablet (25 mg total) by mouth daily.  30 tablet  3  . cyclobenzaprine (FLEXERIL) 5 MG tablet Take 1 tablet (5 mg total) by mouth at bedtime as needed for muscle spasms.  30 tablet  0  . glucose blood test strip 1 each by Other route as needed for other. Check Blood sugar as directed by MD  100 each  2  . HYDROcodone-acetaminophen (VICODIN) 5-500 MG per tablet       . insulin glargine (LANTUS) 100 UNIT/ML injection 25 units SQ qam      . Krill Oil CAPS Take 1 capsule by mouth daily.  30 capsule    . Lancets (ONETOUCH ULTRASOFT) lancets       . lisinopril (PRINIVIL,ZESTRIL) 20 MG tablet Take 1 tablet (  20 mg total) by mouth 2 (two) times daily.  60 tablet  10  . metFORMIN (GLUCOPHAGE) 1000 MG tablet 1 tab po bid and 1/2 tab po q noon  75 tablet  5  . metoprolol (TOPROL-XL) 25 MG 24 hr tablet Take 25 mg by mouth daily.        . ONE TOUCH LANCETS MISC Use as directed to check Blood Sugar  200 each  1  . pravastatin (PRAVACHOL) 10 MG tablet Take 1 tablet (10 mg total) by mouth daily.  30 tablet  3  . pregabalin (LYRICA) 50 MG capsule Take 1 capsule (50 mg total) by mouth every morning.  28 capsule  0   No current facility-administered medications on file prior to visit.    No Known Allergies  Review of Systems  Review of Systems  Constitutional: Positive for malaise/fatigue. Negative for fever.  HENT: Negative for congestion.   Eyes: Negative for pain and discharge.  Respiratory: Negative for shortness of  breath.   Cardiovascular: Negative for chest pain, palpitations and leg swelling.  Gastrointestinal: Negative for nausea, abdominal pain and diarrhea.  Genitourinary: Negative for dysuria.  Musculoskeletal: Positive for back pain. Negative for falls.  Skin: Negative for rash.  Neurological: Positive for sensory change. Negative for loss of consciousness and headaches.  Endo/Heme/Allergies: Negative for polydipsia.  Psychiatric/Behavioral: Negative for depression and suicidal ideas. The patient is not nervous/anxious and does not have insomnia.     Objective  BP 124/80  Pulse 81  Temp(Src) 98.3 F (36.8 C) (Oral)  Ht 5\' 3"  (1.6 m)  Wt 137 lb 1.9 oz (62.197 kg)  BMI 24.3 kg/m2  SpO2 97%  Physical Exam  Physical Exam  Constitutional: She is oriented to person, place, and time and well-developed, well-nourished, and in no distress. No distress.  HENT:  Head: Normocephalic and atraumatic.  Eyes: Conjunctivae are normal.  Neck: Neck supple. No thyromegaly present.  Cardiovascular: Normal rate, regular rhythm and normal heart sounds.  Exam reveals no gallop.   No murmur heard. Pulmonary/Chest: Effort normal and breath sounds normal. She has no wheezes.  Abdominal: She exhibits no distension and no mass.  Musculoskeletal: She exhibits no edema.  Lymphadenopathy:    She has no cervical adenopathy.  Neurological: She is alert and oriented to person, place, and time.  Skin: Skin is warm and dry. No rash noted. She is not diaphoretic.  Psychiatric: Memory, affect and judgment normal.    Lab Results  Component Value Date   TSH 2.82 12/17/2012   Lab Results  Component Value Date   WBC 6.8 12/17/2012   HGB 14.1 12/17/2012   HCT 41.4 12/17/2012   MCV 85.6 12/17/2012   PLT 312.0 12/17/2012   Lab Results  Component Value Date   CREATININE 0.5 12/17/2012   BUN 11 12/17/2012   NA 132* 12/17/2012   K 4.0 12/17/2012   CL 99 12/17/2012   CO2 23 12/17/2012   Lab Results  Component Value  Date   ALT 25 12/17/2012   AST 18 12/17/2012   ALKPHOS 112 12/17/2012   BILITOT 0.7 12/17/2012   Lab Results  Component Value Date   CHOL 175 12/17/2012   Lab Results  Component Value Date   HDL 41.40 12/17/2012   Lab Results  Component Value Date   LDLCALC 120* 12/17/2012   Lab Results  Component Value Date   TRIG 69.0 12/17/2012   Lab Results  Component Value Date   CHOLHDL 4 12/17/2012  Assessment & Plan  ESSENTIAL HYPERTENSION, BENIGN Well controlled, no changes  Back pain Prednisone was helpful when she was taking it but pain is worse again without it. Will check xray and start Ibuprofen 400 mg po in am and cyclobenzaprine 5 mg qhs, try moist heat and stretching. If no improvement may need referral to orthopaedics  Diabetes mellitus type II, uncontrolled Sugars worsened with prednisone. Encouraged decrease simple carbs and continue insulin

## 2013-03-26 NOTE — Assessment & Plan Note (Signed)
Well controlled, no changes 

## 2013-03-26 NOTE — Assessment & Plan Note (Signed)
Sugars worsened with prednisone. Encouraged decrease simple carbs and continue insulin

## 2013-03-27 LAB — URINE CULTURE
Colony Count: NO GROWTH
Organism ID, Bacteria: NO GROWTH

## 2013-03-28 NOTE — Progress Notes (Signed)
Quick Note:  Patient Informed and voiced understanding ______ 

## 2013-04-01 ENCOUNTER — Ambulatory Visit (HOSPITAL_BASED_OUTPATIENT_CLINIC_OR_DEPARTMENT_OTHER)
Admission: RE | Admit: 2013-04-01 | Discharge: 2013-04-01 | Disposition: A | Discharge status: Home / Self Care | Payer: BC Managed Care – PPO | Source: Ambulatory Visit | Attending: Family Medicine | Admitting: Family Medicine

## 2013-04-01 DIAGNOSIS — M545 Low back pain, unspecified: Secondary | ICD-10-CM | POA: Insufficient documentation

## 2013-04-01 DIAGNOSIS — M549 Dorsalgia, unspecified: Secondary | ICD-10-CM

## 2013-04-19 ENCOUNTER — Ambulatory Visit (INDEPENDENT_AMBULATORY_CARE_PROVIDER_SITE_OTHER): Payer: BC Managed Care – PPO | Admitting: Family Medicine

## 2013-04-19 ENCOUNTER — Ambulatory Visit (HOSPITAL_BASED_OUTPATIENT_CLINIC_OR_DEPARTMENT_OTHER)
Admission: RE | Admit: 2013-04-19 | Discharge: 2013-04-19 | Disposition: A | Discharge status: Home / Self Care | Payer: BC Managed Care – PPO | Source: Ambulatory Visit | Attending: Family Medicine | Admitting: Family Medicine

## 2013-04-19 ENCOUNTER — Encounter: Payer: Self-pay | Admitting: Family Medicine

## 2013-04-19 VITALS — BP 124/90 | HR 94 | Temp 98.0°F | Ht 63.0 in | Wt 138.1 lb

## 2013-04-19 DIAGNOSIS — M25521 Pain in right elbow: Secondary | ICD-10-CM

## 2013-04-19 DIAGNOSIS — I798 Other disorders of arteries, arterioles and capillaries in diseases classified elsewhere: Secondary | ICD-10-CM | POA: Insufficient documentation

## 2013-04-19 DIAGNOSIS — R5383 Other fatigue: Secondary | ICD-10-CM

## 2013-04-19 DIAGNOSIS — R531 Weakness: Secondary | ICD-10-CM

## 2013-04-19 DIAGNOSIS — R0602 Shortness of breath: Secondary | ICD-10-CM

## 2013-04-19 DIAGNOSIS — E1165 Type 2 diabetes mellitus with hyperglycemia: Secondary | ICD-10-CM

## 2013-04-19 DIAGNOSIS — R5381 Other malaise: Secondary | ICD-10-CM | POA: Insufficient documentation

## 2013-04-19 DIAGNOSIS — IMO0001 Reserved for inherently not codable concepts without codable children: Secondary | ICD-10-CM

## 2013-04-19 DIAGNOSIS — E1159 Type 2 diabetes mellitus with other circulatory complications: Secondary | ICD-10-CM | POA: Insufficient documentation

## 2013-04-19 DIAGNOSIS — M25529 Pain in unspecified elbow: Secondary | ICD-10-CM

## 2013-04-19 DIAGNOSIS — I10 Essential (primary) hypertension: Secondary | ICD-10-CM

## 2013-04-19 DIAGNOSIS — G609 Hereditary and idiopathic neuropathy, unspecified: Secondary | ICD-10-CM

## 2013-04-19 DIAGNOSIS — R209 Unspecified disturbances of skin sensation: Secondary | ICD-10-CM | POA: Insufficient documentation

## 2013-04-19 DIAGNOSIS — M6281 Muscle weakness (generalized): Secondary | ICD-10-CM

## 2013-04-19 MED ORDER — CLOPIDOGREL BISULFATE 75 MG PO TABS: 75.0000 mg | ORAL_TABLET | Freq: Every day | ORAL | Status: DC

## 2013-04-19 NOTE — Progress Notes (Signed)
[name], female   DOB: June 05, 1966, 47 y.o.   MRN: [id number] [name] [id number] [birth year] 04/19/2013      Progress Note-Follow Up  Subjective  Chief Complaint  Chief Complaint  Patient presents with  . right side of body    "tingly" right arm, heaviness in right arm, fidgety    HPI  Patient is a 47 year old Caucasian female who was at work overnight on August 7 when she began to feel badly. She had right-sided weakness. She had difficulty with speech and word finding.  Days she's had persistent weakness on the right side difficulty with speaking and word finding and increased anxiety. She's had a bad sensation of paresthesias and neuropathy has worsened. She's been unable to sleep or lie comfortably. She's not had any falls or loss of consciousness but is oriented she had a neurologic event. No headache or other neurologic complaints noted. She continues to be a poorly controlled diabetic who unfortunately [facility] but switch to [employer]. He is here today with her husband for the first time is unaware that she had been instructed to avoid simple carbs and take an aspirin daily.  Past Medical History  Diagnosis Date  . Hypertension   . Hyperlipidemia 12/06/2010  . Overweight(278.02) 12/06/2010  . NEPHROLITHIASIS, HX OF 10/07/2010  . RESTLESS LEG SYNDROME 10/25/2010  . PERIPHERAL NEUROPATHY, FEET 10/07/2010  . HEART MURMUR, HX OF 10/07/2010  . Essential hypertension, benign 10/07/2010  . DM 10/07/2010  . Disturbance of skin sensation 10/07/2010  . COMMON MIGRAINE 10/07/2010  . Acute bronchitis 07/08/2011  . Diabetes mellitus type II, uncontrolled 10/07/2010    Qualifier: Diagnosis of  By: Abner Greenspan MD, Misty Stanley    . PVD (peripheral vascular disease) 01/21/2012  . SOB (shortness of breath) 04/01/2012  . Depression 12/18/2012  . Weakness of right side of body 04/19/2013    History reviewed. No pertinent past surgical history.  Family History  Problem Relation Age of  Onset  . Arthritis Mother   . Stroke Brother     previous smoker  . Alcohol abuse Brother     in remission  . Leukemia Brother   . Diabetes Paternal Grandmother     History   Social History  . Marital Status: Married    Spouse Name: N/A    Number of Children: N/A  . Years of Education: N/A   Occupational History  . Not on file.   Social History Main Topics  . Smoking status: Former Smoker -- 0.50 packs/day    Quit date: 09/08/2005  . Smokeless tobacco: Never Used  . Alcohol Use: No     Comment: occasional  . Drug Use: No  . Sexually Active: Yes -- Female partner(s)   Other Topics Concern  . Not on file   Social History Narrative  . No narrative on file    Current Outpatient Prescriptions on File Prior to Visit  Medication Sig Dispense Refill  . amitriptyline (ELAVIL) 10 MG tablet 1-2 tabs po qhs as directed  60 tablet  3  . aspirin EC 81 MG tablet Take 1 tablet (81 mg total) by mouth daily.  30 tablet    . chlorthalidone (HYGROTON) 25 MG tablet Take 1 tablet (25 mg total) by mouth daily.  30 tablet  3  . cyclobenzaprine (FLEXERIL) 5 MG tablet Take 1 tablet (5 mg total) by mouth at bedtime as needed for muscle spasms.  30 tablet  0  . HYDROcodone-acetaminophen (VICODIN) 5-500 MG per  tablet       . ibuprofen (ADVIL,MOTRIN) 400 MG tablet Take 1 tablet (400 mg total) by mouth 2 (two) times daily as needed for pain (with food).  60 tablet  2  . insulin glargine (LANTUS) 100 UNIT/ML injection 25 units SQ qam      . Krill Oil CAPS Take 1 capsule by mouth daily.  30 capsule    . metFORMIN (GLUCOPHAGE) 1000 MG tablet 1 tab po bid and 1/2 tab po q noon  75 tablet  5  . metoprolol (TOPROL-XL) 25 MG 24 hr tablet Take 25 mg by mouth daily.        . pravastatin (PRAVACHOL) 10 MG tablet Take 1 tablet (10 mg total) by mouth daily.  30 tablet  3  . pregabalin (LYRICA) 50 MG capsule Take 1 capsule (50 mg total) by mouth every morning.  28 capsule  0   No current facility-administered  medications on file prior to visit.    No Known Allergies  Review of Systems  Review of Systems  Constitutional: Positive for malaise/fatigue. Negative for fever.  HENT: Negative for congestion.   Eyes: Negative for pain and discharge.  Respiratory: Positive for shortness of breath.   Cardiovascular: Negative for chest pain, palpitations and leg swelling.  Gastrointestinal: Negative for nausea, abdominal pain and diarrhea.  Genitourinary: Negative for dysuria.  Musculoskeletal: Negative for falls.  Skin: Negative for rash.  Neurological: Positive for tingling and focal weakness. Negative for loss of consciousness and headaches.  Endo/Heme/Allergies: Negative for polydipsia.  Psychiatric/Behavioral: Negative for depression and suicidal ideas. The patient is not nervous/anxious and does not have insomnia.     Objective  BP 124/90  Pulse 94  Temp(Src) 98 F (36.7 C) (Oral)  Ht 5\' 3"  (1.6 m)  Wt 138 lb 1.3 oz (62.633 kg)  BMI 24.47 kg/m2  SpO2 97%  Physical Exam  Physical Exam  Constitutional: She is oriented to person, place, and time and well-developed, well-nourished, and in no distress. No distress.  HENT:  Head: Normocephalic and atraumatic.  Eyes: Conjunctivae are normal.  Neck: Neck supple. No thyromegaly present.  Cardiovascular: Normal rate and regular rhythm.  Exam reveals no gallop.   No murmur heard. Pulmonary/Chest: Effort normal and breath sounds normal. She has no wheezes.  Abdominal: She exhibits no distension and no mass.  Musculoskeletal: She exhibits no edema.  Lymphadenopathy:    She has no cervical adenopathy.  Neurological: She is alert and oriented to person, place, and time. A cranial nerve deficit is present. Coordination abnormal.  Slight decrease cessation in face on right, slight weakness in right side of face and right arm and leg.   Skin: Skin is warm and dry. No rash noted. She is not diaphoretic.  Psychiatric: Memory, affect and judgment  normal.    Lab Results  Component Value Date   TSH 2.82 12/17/2012   Lab Results  Component Value Date   WBC 6.8 12/17/2012   HGB 14.1 12/17/2012   HCT 41.4 12/17/2012   MCV 85.6 12/17/2012   PLT 312.0 12/17/2012   Lab Results  Component Value Date   CREATININE 0.5 12/17/2012   BUN 11 12/17/2012   NA 132* 12/17/2012   K 4.0 12/17/2012   CL 99 12/17/2012   CO2 23 12/17/2012   Lab Results  Component Value Date   ALT 25 12/17/2012   AST 18 12/17/2012   ALKPHOS 112 12/17/2012   BILITOT 0.7 12/17/2012   Lab Results  Component Value  Date   CHOL 175 12/17/2012   Lab Results  Component Value Date   HDL 41.40 12/17/2012   Lab Results  Component Value Date   LDLCALC 120* 12/17/2012   Lab Results  Component Value Date   TRIG 69.0 12/17/2012   Lab Results  Component Value Date   CHOLHDL 4 12/17/2012     Assessment & Plan  PERIPHERAL NEUROPATHY, FEET Severe secondary to uncontrolled diabetes.  ESSENTIAL HYPERTENSION, BENIGN Well controlled, no changes  SOB (shortness of breath) Has felt an increase in SOB this week. Will order an echo to further evaluate. EKG is wnl today.  Diabetes mellitus type II, uncontrolled Patient continues to work excessive hours and eat poorly especially when she is working. Unfortunately she continues to drink soda as many as 1/2 a dozen on those days. She simply switched from [facility] to [employer]. She agrees to try and stop them and finally agrees to referral to endocrinology for further treatment.   Weakness of right side of body Patient was working an overnight shift on 8/7 and she had a sense of weakness and word finding difficulty which is improving but has not fully resolved. She c/o a restless, fidgety sensation in her body which has not allowed her to sleep or lie down for several days. CT of head is negative today but her clinical presentation is concerning for a CVA she is set up with neurology tomorrow. Was given ECASA 162 mg in office and  started on Plavix 75 mg daily. She will seek immediate medical care if symptoms worsen over night.

## 2013-04-19 NOTE — Assessment & Plan Note (Signed)
Has felt an increase in SOB this week. Will order an echo to further evaluate. EKG is wnl today.

## 2013-04-19 NOTE — Assessment & Plan Note (Signed)
Severe secondary to uncontrolled diabetes.

## 2013-04-19 NOTE — Progress Notes (Signed)
Quick Note:  Patient Informed and voiced understanding ______ 

## 2013-04-19 NOTE — Patient Instructions (Addendum)
Stroke (Cerebrovascular Accident) A stroke (cerebrovascular accident, CVA) means you have a brain injury from blocked circulation or bleeding in the brain. Blocked circulation usually comes from a clot. RISK FACTORS  High blood pressure (hypertension).   High cholesterol.   Diabetes.   Heart disease.   The buildup of fatty deposits in the blood vessels (peripheral artery disease or atherosclerosis).   An abnormal heart rhythm (atrial fibrillation).   Obesity.   Smoking.   Taking oral contraceptives (especially in combination with smoking).   Physical inactivity.   A diet high in fats, salt (sodium), and calories.   Alcohol use.   Use of illegal drugs (especially cocaine and methamphetamine).   Being a female.   Being an African American.   Age over 55.   Family history of stroke.   Previous history of blood clots, a "warning stroke" (transient ischemic attack, TIA), or heart attack.   Sickle cell disease.  SYMPTOMS  The symptoms of a stroke depend on the part of the brain that is affected. It is important to seek treatment within 4 hours of the start of symptoms because you may receive a "clot dissolving" medication that cannot be given after that time. Even if you don't know when your symptoms began, get treatment as soon as possible. Symptoms of a stroke may progress or change over the first several days. Symptoms may include:  Sudden weakness or numbness of the face, arm, or leg, especially on one side of the body.   Sudden confusion.   Trouble speaking (aphasia) or understanding.   Sudden trouble seeing in one or both eyes.   Sudden trouble walking.   Dizziness.   Loss of balance or coordination.   Sudden severe headache with no known cause.  HOME CARE INSTRUCTIONS   Medicines: Aspirin and blood thinners may be used to prevent another stroke. Blood thinners need to be used exactly as instructed. Medicines may also be used to control risk factors for a  stroke. Be sure you understand all your medicine instructions.   Diet: Certain diets may be prescribed to address high blood pressure, high cholesterol, diabetes, or obesity. A diet that includes 5 or more servings of fruits and vegetables a day may reduce the risk of stroke. Foods may need to be a special consistency (soft or pureed), or small bites may need to be taken in order to avoid aspirating or choking.   Maintain a healthy weight.   Stay physically active. It is recommended that you get at least 30 minutes of activity on most or all days.   Do not smoke.   Limit alcohol use.   Stop drug abuse.   Home safety: A safe home environment is important to reduce the risk of falls. Your caregiver may arrange for specialists to evaluate your home. Having grab bars in the bedroom and bathroom is often important. Your caregiver may arrange for special equipment to be used at home, such as raised toilets and a seat for the shower.   Physical, occupational, and speech therapy: Ongoing therapy may be needed to maximize your recovery after a stroke. If you have been advised to use a walker or a cane, use it at all times. Be sure to keep your therapy appointments.   Follow all instructions for follow-up with your caregiver. This is VERY important. This includes any referrals, physical therapy, rehabilitation, and laboratory tests. Proper treatment also prevents another stroke from occurring.  SEEK IMMEDIATE MEDICAL CARE IF:   You have   sudden weakness or numbness of the face, arm, or leg, especially on one side of the body.   You have sudden confusion.   You have trouble speaking (aphasia) or understanding.   You have sudden trouble seeing in one or both eyes.   You have sudden trouble walking.   You have dizziness.   You have loss of balance or coordination.   You have a sudden, severe headache with no known cause.   You have a fever.   You are coughing or have difficulty breathing.    You have new chest pain, angina, or an irregular heartbeat.  Any of these symptoms may represent a serious problem that is an emergency. Do not wait to see if the symptoms will go away. Get medical help at once. Call your local emergency services (911 in [place] Do not drive yourself to the hospital. Document Released: 08/25/2005 Document Revised: 03/10/2011 Document Reviewed: 01/23/2010 [employer] Patient Information 2012 [employer]. 

## 2013-04-19 NOTE — Assessment & Plan Note (Signed)
Well controlled, no changes 

## 2013-04-19 NOTE — Assessment & Plan Note (Signed)
Patient was working an overnight shift on 8/7 and she had a sense of weakness and word finding difficulty which is improving but has not fully resolved. She c/o a restless, fidgety sensation in her body which has not allowed her to sleep or lie down for several days. CT of head is negative today but her clinical presentation is concerning for a CVA she is set up with neurology tomorrow. Was given ECASA 162 mg in office and started on Plavix 75 mg daily. She will seek immediate medical care if symptoms worsen over night.

## 2013-04-19 NOTE — Assessment & Plan Note (Signed)
Patient continues to work excessive hours and eat poorly especially when she is working. Unfortunately she continues to drink soda as many as 1/2 a dozen on those days. She simply switched from [facility] to [employer]. She agrees to try and stop them and finally agrees to referral to endocrinology for further treatment.

## 2013-04-20 ENCOUNTER — Other Ambulatory Visit: Payer: Self-pay

## 2013-04-20 ENCOUNTER — Ambulatory Visit (HOSPITAL_BASED_OUTPATIENT_CLINIC_OR_DEPARTMENT_OTHER)
Admission: RE | Admit: 2013-04-20 | Discharge: 2013-04-20 | Disposition: A | Discharge status: Home / Self Care | Payer: BC Managed Care – PPO | Source: Ambulatory Visit | Attending: Family Medicine | Admitting: Family Medicine

## 2013-04-20 DIAGNOSIS — R55 Syncope and collapse: Secondary | ICD-10-CM

## 2013-04-20 DIAGNOSIS — R531 Weakness: Secondary | ICD-10-CM

## 2013-04-20 DIAGNOSIS — R0602 Shortness of breath: Secondary | ICD-10-CM

## 2013-04-20 MED ORDER — GLUCOSE BLOOD VI STRP: 1.0000 | ORAL_STRIP | Freq: Every day | Status: DC

## 2013-04-20 MED ORDER — FREESTYLE LANCETS MISC: Status: DC

## 2013-04-20 NOTE — Progress Notes (Signed)
Echocardiogram 2D Echocardiogram has been performed.  [name] 04/20/2013, 12:49 PM

## 2013-04-20 NOTE — Telephone Encounter (Signed)
Rachael at [facility] left a message stating that the Freedom Lite lancets or test strips are covered by patients insurance?   I called Rachael back and she states they need a verbal to change the lancets and test strips to One Touch.  Verbal given to Rachael- ok to change

## 2013-04-21 ENCOUNTER — Telehealth: Payer: Self-pay

## 2013-04-21 NOTE — Telephone Encounter (Signed)
Patient left a message stating she was wandering what was going on with her FMLA paperwork? Pt stated it needs to be faxed to [phone]

## 2013-04-21 NOTE — Telephone Encounter (Signed)
Done, on your desk

## 2013-04-22 ENCOUNTER — Ambulatory Visit (INDEPENDENT_AMBULATORY_CARE_PROVIDER_SITE_OTHER): Payer: BC Managed Care – PPO | Admitting: Neurology

## 2013-04-22 ENCOUNTER — Encounter: Payer: Self-pay | Admitting: Neurology

## 2013-04-22 ENCOUNTER — Ambulatory Visit (INDEPENDENT_AMBULATORY_CARE_PROVIDER_SITE_OTHER): Payer: BC Managed Care – PPO | Admitting: Family Medicine

## 2013-04-22 ENCOUNTER — Encounter: Payer: Self-pay | Admitting: Family Medicine

## 2013-04-22 VITALS — BP 128/78 | HR 78 | Temp 98.1°F | Ht 62.5 in | Wt 138.0 lb

## 2013-04-22 VITALS — BP 165/94 | HR 91 | Temp 98.0°F | Ht 63.0 in | Wt 138.1 lb

## 2013-04-22 DIAGNOSIS — R29898 Other symptoms and signs involving the musculoskeletal system: Secondary | ICD-10-CM

## 2013-04-22 DIAGNOSIS — IMO0001 Reserved for inherently not codable concepts without codable children: Secondary | ICD-10-CM

## 2013-04-22 DIAGNOSIS — E785 Hyperlipidemia, unspecified: Secondary | ICD-10-CM

## 2013-04-22 DIAGNOSIS — E1165 Type 2 diabetes mellitus with hyperglycemia: Secondary | ICD-10-CM

## 2013-04-22 DIAGNOSIS — R531 Weakness: Secondary | ICD-10-CM

## 2013-04-22 DIAGNOSIS — I639 Cerebral infarction, unspecified: Secondary | ICD-10-CM

## 2013-04-22 DIAGNOSIS — I10 Essential (primary) hypertension: Secondary | ICD-10-CM

## 2013-04-22 DIAGNOSIS — M6281 Muscle weakness (generalized): Secondary | ICD-10-CM

## 2013-04-22 DIAGNOSIS — I6350 Cerebral infarction due to unspecified occlusion or stenosis of unspecified cerebral artery: Secondary | ICD-10-CM

## 2013-04-22 NOTE — Telephone Encounter (Signed)
Faxed to number below.

## 2013-04-22 NOTE — Patient Instructions (Addendum)
DASH Diet The DASH diet stands for "Dietary Approaches to Stop Hypertension." It is a healthy eating plan that has been shown to reduce high blood pressure (hypertension) in as little as 14 days, while also possibly providing other significant health benefits. These other health benefits include reducing the risk of breast cancer after menopause and reducing the risk of type 2 diabetes, heart disease, colon cancer, and stroke. Health benefits also include weight loss and slowing kidney failure in patients with chronic kidney disease.  DIET GUIDELINES  Limit salt (sodium). Your diet should contain less than 1500 mg of sodium daily.  Limit refined or processed carbohydrates. Your diet should include mostly whole grains. Desserts and added sugars should be used sparingly.  Include small amounts of heart-healthy fats. These types of fats include nuts, oils, and tub margarine. Limit saturated and trans fats. These fats have been shown to be harmful in the body. CHOOSING FOODS  The following food groups are based on a 2000 calorie diet. See your Registered Dietitian for individual calorie needs. Grains and Grain Products (6 to 8 servings daily)  Eat More Often: Whole-wheat bread, brown rice, whole-grain or wheat pasta, quinoa, popcorn without added fat or salt (air popped).  Eat Less Often: White bread, white pasta, white rice, cornbread. Vegetables (4 to 5 servings daily)  Eat More Often: Fresh, frozen, and canned vegetables. Vegetables may be raw, steamed, roasted, or grilled with a minimal amount of fat.  Eat Less Often/Avoid: Creamed or fried vegetables. Vegetables in a cheese sauce. Fruit (4 to 5 servings daily)  Eat More Often: All fresh, canned (in natural juice), or frozen fruits. Dried fruits without added sugar. One hundred percent fruit juice ( cup [237 mL] daily).  Eat Less Often: Dried fruits with added sugar. Canned fruit in light or heavy syrup. [employer], Fish, and Poultry (2  servings or less daily. One serving is 3 to 4 oz [85-114 g]).  Eat More Often: Ninety percent or leaner ground beef, tenderloin, sirloin. Round cuts of beef, chicken breast, [place] breast. All fish. Grill, bake, or broil your meat. Nothing should be fried.  Eat Less Often/Avoid: Fatty cuts of meat, [place], or chicken leg, thigh, or wing. Fried cuts of meat or fish. Dairy (2 to 3 servings)  Eat More Often: Low-fat or fat-free milk, low-fat plain or light yogurt, reduced-fat or part-skim cheese.  Eat Less Often/Avoid: Milk (whole, 2%).Whole milk yogurt. Full-fat cheeses. Nuts, Seeds, and Legumes (4 to 5 servings per week)  Eat More Often: All without added salt.  Eat Less Often/Avoid: Salted nuts and seeds, canned beans with added salt. Fats and Sweets (limited)  Eat More Often: Vegetable oils, tub margarines without trans fats, sugar-free gelatin. Mayonnaise and salad dressings.  Eat Less Often/Avoid: Coconut oils, palm oils, butter, stick margarine, cream, half and half, cookies, candy, pie. FOR MORE INFORMATION The Dash Diet Eating Plan: www.dashdiet.org Document Released: 08/14/2011 Document Revised: 11/17/2011 Document Reviewed: 08/14/2011 [facility] Patient Information 2014 [place], [place]. Stroke (Cerebrovascular Accident) A stroke (cerebrovascular accident, CVA) means you have a brain injury from blocked circulation or bleeding in the brain. Blocked circulation usually comes from a clot. RISK FACTORS  High blood pressure (hypertension).   High cholesterol.   Diabetes.   Heart disease.   The buildup of fatty deposits in the blood vessels (peripheral artery disease or atherosclerosis).   An abnormal heart rhythm (atrial fibrillation).   Obesity.   Smoking.   Taking oral contraceptives (especially in combination with smoking).  Physical inactivity.   A diet high in fats, salt (sodium), and calories.   Alcohol use.   Use of illegal drugs (especially cocaine and  methamphetamine).   Being a female.   Being an Tree surgeon.   Age over 90.   Family history of stroke.   Previous history of blood clots, a "warning stroke" (transient ischemic attack, TIA), or heart attack.   Sickle cell disease.  SYMPTOMS  The symptoms of a stroke depend on the part of the brain that is affected. It is important to seek treatment within 4 hours of the start of symptoms because you may receive a "clot dissolving" medication that cannot be given after that time. Even if you don't know when your symptoms began, get treatment as soon as possible. Symptoms of a stroke may progress or change over the first several days. Symptoms may include:  Sudden weakness or numbness of the face, arm, or leg, especially on one side of the body.   Sudden confusion.   Trouble speaking (aphasia) or understanding.   Sudden trouble seeing in one or both eyes.   Sudden trouble walking.   Dizziness.   Loss of balance or coordination.   Sudden severe headache with no known cause.  HOME CARE INSTRUCTIONS   Medicines: Aspirin and blood thinners may be used to prevent another stroke. Blood thinners need to be used exactly as instructed. Medicines may also be used to control risk factors for a stroke. Be sure you understand all your medicine instructions.   Diet: Certain diets may be prescribed to address high blood pressure, high cholesterol, diabetes, or obesity. A diet that includes 5 or more servings of fruits and vegetables a day may reduce the risk of stroke. Foods may need to be a special consistency (soft or pureed), or small bites may need to be taken in order to avoid aspirating or choking.   Maintain a healthy weight.   Stay physically active. It is recommended that you get at least 30 minutes of activity on most or all days.   Do not smoke.   Limit alcohol use.   Stop drug abuse.   Home safety: A safe home environment is important to reduce the risk of falls. Your  caregiver may arrange for specialists to evaluate your home. Having grab bars in the bedroom and bathroom is often important. Your caregiver may arrange for special equipment to be used at home, such as raised toilets and a seat for the shower.   Physical, occupational, and speech therapy: Ongoing therapy may be needed to maximize your recovery after a stroke. If you have been advised to use a walker or a cane, use it at all times. Be sure to keep your therapy appointments.   Follow all instructions for follow-up with your caregiver. This is VERY important. This includes any referrals, physical therapy, rehabilitation, and laboratory tests. Proper treatment also prevents another stroke from occurring.  SEEK IMMEDIATE MEDICAL CARE IF:   You have sudden weakness or numbness of the face, arm, or leg, especially on one side of the body.   You have sudden confusion.   You have trouble speaking (aphasia) or understanding.   You have sudden trouble seeing in one or both eyes.   You have sudden trouble walking.   You have dizziness.   You have loss of balance or coordination.   You have a sudden, severe headache with no known cause.   You have a fever.   You are coughing  or have difficulty breathing.   You have new chest pain, angina, or an irregular heartbeat.  Any of these symptoms may represent a serious problem that is an emergency. Do not wait to see if the symptoms will go away. Get medical help at once. Call your local emergency services (911 in [place] Do not drive yourself to the hospital. Document Released: 08/25/2005 Document Revised: 03/10/2011 Document Reviewed: 01/23/2010 [facility] Patient Information 2012 [place], [place].

## 2013-04-22 NOTE — Patient Instructions (Addendum)
1.  Continue the baby aspirin 81mg  daily.  You can stop the Plavix. 2.  Continue cholesterol medication.   3.  Work on diabetes control 4.  Maintain blood pressure control 5.  Heart-healthy, low sodium diabetic diet. 6.  Regular exercise 7.  We will schedule MRI of the brain 8.  We will schedule for 48 hour Holter monitor 9.  If hand doesn't get better, consider occupational therapy. 10.  Follow up in one month or after Holter and MRI performed.  Your MRI is scheduled at [facility] located at [address] in [place] on Thursday, August 21st at 10:00 am . Please arrive 30 minutes prior to your appointment time.   [phone].  Your carotid doppler study and Holter monitor application will be done at [facility] located at [facility] in [place]; [address] on Monday, August 18th at 9:15 am.   [phone].

## 2013-04-22 NOTE — Progress Notes (Signed)
NEUROLOGY CONSULTATION NOTE  [name] MRN: [id number] DOB: Mar 26, 1966  Referring provider: Dr. Abner Greenspan Primary care provider: Dr. Abner Greenspan  Reason for consult:  Possible stroke  HISTORY OF PRESENT ILLNESS: [name] is a 47 year old female with history of uncontrolled DM type II, diabetic neuropathy, RLS, heart murmur, hyperlipidemia, back pain, depression, HTN and PVD, who presents for evaluation of stroke.Marland Kitchen  Records and images were personally reviewed where available.    On 04/14/13, she was working an overnight shift, which she usually doesn't do.  For the rest of the weekend, she felt jittery and was constantly pacing.  She woke up Monday morning, and had difficulty speaking.  She said her speech was slurred and she was having word-finding difficulties.  She also noted that her right arm and leg were "slow", and she had a sense of heaviness and tingling in her right arm with weaker grip.  She had trouble with balance.    She denied symptoms involving the face.  She noted a mild throbbing of the left temple, but denied any headache.  She saw her PCP, Dr. Rogelia Rohrer, on 04/19/13, who gave her ECASA 162mg  and was started on Plavix 75mg  daily.  She had a CT of brain performed, as her insurance wouldn't cover an MRI until a CT was done.  Since Monday, symptoms have improved, but have not resolved.  She still has some trouble talking.  She still notes difficulty with fine motor skills in her right hand, particularly with difficulty writing.  When she walks, she sometimes feels she veers to the right.  She denies palpitations or flutter.  She denies obvious visual changes but feels she has to squint her right eye more for clarity (she already has cataracts in her left eye).    Prior to this event, she was advised by Dr. Abner Greenspan to start a baby aspirin, but she never initiated this.  Looking back at her flowsheet, her blood pressure has only begun to be more controlled over the past month.  A CT of  the head (04/19/13): no acute intracranial abnormality, such as ischemic stroke or hemorrhage.  2D Echo (04/20/13): LVEF 55-60%, no regional wall abnormalities, no PFO  ECG (04/19/13): NSR 89 bpm  Labs (12/17/12): TSH 2.82, Hgb A1c 14.3, LDL 120, Mg 1.7, WBC 6.8, Hgb 14.1, Hct 41.4, PLT 312, Na 132, K 4.0, Cl 99, CO2 23, BUN 11, Cr 0.5, glucose 300, Ca 9.1, AP 112, AST 18, ALT 25.Marland Kitchen  PAST MEDICAL HISTORY: Past Medical History  Diagnosis Date  . Hypertension   . Hyperlipidemia 12/06/2010  . Overweight(278.02) 12/06/2010  . NEPHROLITHIASIS, HX OF 10/07/2010  . RESTLESS LEG SYNDROME 10/25/2010  . PERIPHERAL NEUROPATHY, FEET 10/07/2010  . HEART MURMUR, HX OF 10/07/2010  . Essential hypertension, benign 10/07/2010  . DM 10/07/2010  . Disturbance of skin sensation 10/07/2010  . COMMON MIGRAINE 10/07/2010  . Acute bronchitis 07/08/2011  . Diabetes mellitus type II, uncontrolled 10/07/2010    Qualifier: Diagnosis of  By: Abner Greenspan MD, Misty Stanley    . PVD (peripheral vascular disease) 01/21/2012  . SOB (shortness of breath) 04/01/2012  . Depression 12/18/2012  . Weakness of right side of body 04/19/2013    PAST SURGICAL HISTORY: No past surgical history on file.  MEDICATIONS: Current Outpatient Prescriptions on File Prior to Visit  Medication Sig Dispense Refill  . amitriptyline (ELAVIL) 10 MG tablet 1-2 tabs po qhs as directed  60 tablet  3  . aspirin EC 81 MG tablet Take  1 tablet (81 mg total) by mouth daily.  30 tablet    . chlorthalidone (HYGROTON) 25 MG tablet Take 1 tablet (25 mg total) by mouth daily.  30 tablet  3  . clopidogrel (PLAVIX) 75 MG tablet Take 1 tablet (75 mg total) by mouth daily.  30 tablet  2  . cyclobenzaprine (FLEXERIL) 5 MG tablet Take 1 tablet (5 mg total) by mouth at bedtime as needed for muscle spasms.  30 tablet  0  . glucose blood (FREESTYLE LITE) test strip 1 each by Other route daily. DX- 250.02  Pts machine is Freestyle Freedom Lite  100 each  5  . ibuprofen (ADVIL,MOTRIN)  400 MG tablet Take 1 tablet (400 mg total) by mouth 2 (two) times daily as needed for pain (with food).  60 tablet  2  . insulin glargine (LANTUS) 100 UNIT/ML injection 25 units SQ qam      . Krill Oil CAPS Take 1 capsule by mouth daily.  30 capsule    . Lancets (FREESTYLE) lancets DX- 250.02  Pts machine is Freestyle Freedom Lite  100 each  5  . metFORMIN (GLUCOPHAGE) 1000 MG tablet 1 tab po bid and 1/2 tab po q noon  75 tablet  5  . metoprolol (TOPROL-XL) 25 MG 24 hr tablet Take 25 mg by mouth daily.        Marland Kitchen HYDROcodone-acetaminophen (VICODIN) 5-500 MG per tablet       . pravastatin (PRAVACHOL) 10 MG tablet Take 1 tablet (10 mg total) by mouth daily.  30 tablet  3  . pregabalin (LYRICA) 50 MG capsule Take 1 capsule (50 mg total) by mouth every morning.  28 capsule  0   No current facility-administered medications on file prior to visit.    ALLERGIES: No Known Allergies  FAMILY HISTORY: Family History  Problem Relation Age of Onset  . Arthritis Mother   . Stroke Brother     previous smoker  . Alcohol abuse Brother     in remission  . Leukemia Brother   . Diabetes Paternal Grandmother     SOCIAL HISTORY: History   Social History  . Marital Status: Married    Spouse Name: N/A    Number of Children: N/A  . Years of Education: N/A   Occupational History  . Not on file.   Social History Main Topics  . Smoking status: Former Smoker -- 0.50 packs/day    Quit date: 09/08/2005  . Smokeless tobacco: Never Used  . Alcohol Use: No     Comment: occasional  . Drug Use: No  . Sexual Activity: Yes    Partners: Male   Other Topics Concern  . Not on file   Social History Narrative  . No narrative on file    REVIEW OF SYSTEMS: Constitutional: No fevers, chills, or sweats, no generalized fatigue, change in appetite Eyes: No visual changes, double vision, eye pain Ear, nose and throat: No hearing loss, ear pain, nasal congestion, sore throat Cardiovascular: No chest pain,  palpitations Respiratory:  No shortness of breath at rest or with exertion, wheezes GastrointestinaI: No nausea, vomiting, diarrhea, abdominal pain, fecal incontinence Genitourinary:  No dysuria, urinary retention or frequency Musculoskeletal:  No neck pain, back pain Integumentary: No rash, pruritus, skin lesions Neurological: as above Psychiatric: No depression, insomnia, anxiety Endocrine: No palpitations, fatigue, diaphoresis, mood swings, change in appetite, change in weight, increased thirst Hematologic/Lymphatic:  No anemia, purpura, petechiae. Allergic/Immunologic: no itchy/runny eyes, nasal congestion, recent allergic reactions, rashes  PHYSICAL EXAM: Filed Vitals:   04/22/13 1013  BP: 128/78  Pulse: 78  Temp: 98.1 F (36.7 C)   General: No acute distress Head:  Normocephalic/atraumatic Neck: supple, no paraspinal tenderness, full range of motion Back: No paraspinal tenderness Heart: regular rate and rhythm Lungs: Clear to auscultation bilaterally. Vascular: No carotid bruits. Neurological Exam: Mental status: alert and oriented to person, place, and time, speech dysfluent and not dysarthric, able to name and repeat with some hesitancy. Cranial nerves: CN I: not tested CN II: pupils equal, round and reactive to light, visual fields intact, fundi unremarkable. CN III, IV, VI:  full range of motion, no nystagmus, no ptosis CN V: facial sensation intact CN VII: upper and lower face symmetric CN VIII: hearing intact CN IX, X: gag intact, uvula midline CN XI: sternocleidomastoid and trapezius muscles intact CN XII: tongue midline Bulk & Tone: normal, no fasciculations. Motor: 5-/5 RUE/LE, 5/5 LUE/LE, reduced finger-thumb tapping speed on right. Sensation: reduced pinprick sensation and vibration sensation in toes bilaterally Deep Tendon Reflexes: 2+ throughout, except absent in the ankles, toes down Finger to nose testing: normal without dysmetria Heel to shin: no  dysmetria Gait: mildly wide-based, sometimes veers towards the right, able to walk in tandem. Romberg negative.  IMPRESSION & PLAN: ALONNAH LAMPKINS is a 47 y.o. female with persistent right sided weakness and speech difficulty.  Suspect left hemispheric stroke, likely MCA territory. 1. MRI Brain to look for evidence of recent stroke.  2. 48 Hour Holter monitor to look for evidence of a fib, which can alter management 3. Carotid dopplers.  She is young, but as atherosclerotic risk factors. 4. Since she was not taking an antiplatelet medication and had not in fact failed ASA, we can continue ASA 81mg  daily and discontinue Plavix.  There is no conclusive evidence ASA and Plavix together significantly decreases risk for stroke, but it does increase risk for bleed. 5. Continue statin therapy.  However, LDL goal should be <70. 6. Strict glycemic control 7. BP control 8. Heart-healthy, low-sodium, diabetic diet. 9. I offered referring her to OT for her right hand, but she chooses to forego this at this time. 10. Follow up in one month or after MRI, dopplers, and holter.  Thank you for allowing me to take part in the care of this patient.  Shon Millet, DO  CC:  Danise Edge, MD

## 2013-04-25 ENCOUNTER — Encounter (INDEPENDENT_AMBULATORY_CARE_PROVIDER_SITE_OTHER): Payer: BC Managed Care – PPO

## 2013-04-25 ENCOUNTER — Other Ambulatory Visit: Payer: Self-pay | Admitting: Neurology

## 2013-04-25 ENCOUNTER — Other Ambulatory Visit: Payer: Self-pay

## 2013-04-25 ENCOUNTER — Telehealth: Payer: Self-pay

## 2013-04-25 ENCOUNTER — Other Ambulatory Visit: Payer: Self-pay | Admitting: *Deleted

## 2013-04-25 ENCOUNTER — Encounter: Payer: Self-pay | Admitting: Family Medicine

## 2013-04-25 ENCOUNTER — Encounter: Payer: Self-pay | Admitting: *Deleted

## 2013-04-25 DIAGNOSIS — R269 Unspecified abnormalities of gait and mobility: Secondary | ICD-10-CM

## 2013-04-25 DIAGNOSIS — R29898 Other symptoms and signs involving the musculoskeletal system: Secondary | ICD-10-CM

## 2013-04-25 DIAGNOSIS — I4891 Unspecified atrial fibrillation: Secondary | ICD-10-CM

## 2013-04-25 MED ORDER — INSULIN GLARGINE 100 UNIT/ML ~~LOC~~ SOLN: SUBCUTANEOUS | Status: DC

## 2013-04-25 NOTE — Assessment & Plan Note (Signed)
Pravastain 10 mg daily

## 2013-04-25 NOTE — Assessment & Plan Note (Signed)
Has taken this episode seriously and has been out of work all week and stopped drinking any sodas or sweet drinks

## 2013-04-25 NOTE — Telephone Encounter (Signed)
Pharmacist left a message stating pts lantus needed to be in pen form. New RX sent

## 2013-04-25 NOTE — Assessment & Plan Note (Signed)
Is being maintained on  Aspirin and is awaiting MRI to confirm CVA.

## 2013-04-25 NOTE — Assessment & Plan Note (Signed)
Improved on recheck, no changes today 

## 2013-04-25 NOTE — Progress Notes (Unsigned)
[name], female   DOB: [birth year], 47 y.o.   MRN: [id number] E-Cardio 48 Hour Holter Monitor applied to patient.

## 2013-04-25 NOTE — Progress Notes (Signed)
[name], female   DOB: 1966-03-06, 47 y.o.   MRN: [id number] CHAMAINE STANKUS [id number] 1965/10/13 04/25/2013      Progress Note-Follow Up  Subjective  Chief Complaint  Chief Complaint  Patient presents with  . Follow-up    1 week    HPI  Today for followup. She continues to str no chest pain or palpitations.many fewer carbs since her episode.  No shortness of breath. No GI or GU complaints. She has been out of work all week and has given up all sweet drinks and is eatinguggle with right-sided weakness although it is improving. Her word finding is improving as well. She has been seen by neurology and is awaiting an MRI to confirm stroke occurred. She is being maintained on aspirin because she was not taking it prior to her episode. They have stopped Plavix  Past Medical History  Diagnosis Date  . Hypertension   . Hyperlipidemia 12/06/2010  . Overweight(278.02) 12/06/2010  . NEPHROLITHIASIS, HX OF 10/07/2010  . RESTLESS LEG SYNDROME 10/25/2010  . PERIPHERAL NEUROPATHY, FEET 10/07/2010  . HEART MURMUR, HX OF 10/07/2010  . Essential hypertension, benign 10/07/2010  . DM 10/07/2010  . Disturbance of skin sensation 10/07/2010  . COMMON MIGRAINE 10/07/2010  . Acute bronchitis 07/08/2011  . Diabetes mellitus type II, uncontrolled 10/07/2010    Qualifier: Diagnosis of  By: Abner Greenspan MD, Misty Stanley    . PVD (peripheral vascular disease) 01/21/2012  . SOB (shortness of breath) 04/01/2012  . Depression 12/18/2012  . Weakness of right side of body 04/19/2013    History reviewed. No pertinent past surgical history.  Family History  Problem Relation Age of Onset  . Arthritis Mother   . Stroke Brother     previous smoker  . Alcohol abuse Brother     in remission  . Leukemia Brother   . Diabetes Paternal Grandmother     History   Social History  . Marital Status: Married    Spouse Name: N/A    Number of Children: N/A  . Years of Education: N/A   Occupational History  . Not  on file.   Social History Main Topics  . Smoking status: Former Smoker -- 0.50 packs/day    Quit date: 09/08/2005  . Smokeless tobacco: Never Used  . Alcohol Use: No     Comment: occasional  . Drug Use: No  . Sexual Activity: Yes    Partners: Male   Other Topics Concern  . Not on file   Social History Narrative  . No narrative on file    Current Outpatient Prescriptions on File Prior to Visit  Medication Sig Dispense Refill  . amitriptyline (ELAVIL) 10 MG tablet 1-2 tabs po qhs as directed  60 tablet  3  . aspirin EC 81 MG tablet Take 1 tablet (81 mg total) by mouth daily.  30 tablet    . chlorthalidone (HYGROTON) 25 MG tablet Take 1 tablet (25 mg total) by mouth daily.  30 tablet  3  . cyclobenzaprine (FLEXERIL) 5 MG tablet Take 1 tablet (5 mg total) by mouth at bedtime as needed for muscle spasms.  30 tablet  0  . glucose blood (FREESTYLE LITE) test strip 1 each by Other route daily. DX- 250.02  Pts machine is Freestyle Freedom Lite  100 each  5  . HYDROcodone-acetaminophen (VICODIN) 5-500 MG per tablet       . ibuprofen (ADVIL,MOTRIN) 400 MG tablet Take 1 tablet (400 mg total) by mouth 2 (  two) times daily as needed for pain (with food).  60 tablet  2  . insulin glargine (LANTUS) 100 UNIT/ML injection 25 units SQ qam      . Krill Oil CAPS Take 1 capsule by mouth daily.  30 capsule    . Lancets (FREESTYLE) lancets DX- 250.02  Pts machine is Freestyle Freedom Lite  100 each  5  . metFORMIN (GLUCOPHAGE) 1000 MG tablet 1 tab po bid and 1/2 tab po q noon  75 tablet  5  . metoprolol (TOPROL-XL) 25 MG 24 hr tablet Take 25 mg by mouth daily.        . pravastatin (PRAVACHOL) 10 MG tablet Take 1 tablet (10 mg total) by mouth daily.  30 tablet  3  . pregabalin (LYRICA) 50 MG capsule Take 1 capsule (50 mg total) by mouth every morning.  28 capsule  0   No current facility-administered medications on file prior to visit.    No Known Allergies  Review of Systems  Review of Systems   Constitutional: Positive for malaise/fatigue. Negative for fever.  HENT: Negative for congestion.   Eyes: Negative for discharge.  Respiratory: Negative for shortness of breath.   Cardiovascular: Negative for chest pain, palpitations and leg swelling.  Gastrointestinal: Negative for nausea, abdominal pain and diarrhea.  Genitourinary: Negative for dysuria.  Musculoskeletal: Negative for falls.  Skin: Negative for rash.  Neurological: Positive for tingling, sensory change and focal weakness. Negative for loss of consciousness and headaches.  Endo/Heme/Allergies: Negative for polydipsia.  Psychiatric/Behavioral: Negative for depression and suicidal ideas. The patient is not nervous/anxious and does not have insomnia.     Objective  BP 165/94  Pulse 91  Temp(Src) 98 F (36.7 C) (Oral)  Ht 5\' 3"  (1.6 m)  Wt 138 lb 1.3 oz (62.633 kg)  BMI 24.47 kg/m2  SpO2 97%  Physical Exam  Physical Exam  Constitutional: She is oriented to person, place, and time and well-developed, well-nourished, and in no distress. No distress.  HENT:  Head: Normocephalic and atraumatic.  Eyes: Conjunctivae are normal.  Neck: Neck supple. No thyromegaly present.  Cardiovascular: Normal rate and regular rhythm.   Murmur heard. Pulmonary/Chest: Effort normal and breath sounds normal. She has no wheezes.  Abdominal: She exhibits no distension and no mass.  Musculoskeletal: She exhibits no edema.  Lymphadenopathy:    She has no cervical adenopathy.  Neurological: She is alert and oriented to person, place, and time.  Skin: Skin is warm and dry. No rash noted. She is not diaphoretic.  Psychiatric: Memory, affect and judgment normal.    Lab Results  Component Value Date   TSH 2.82 12/17/2012   Lab Results  Component Value Date   WBC 6.8 12/17/2012   HGB 14.1 12/17/2012   HCT 41.4 12/17/2012   MCV 85.6 12/17/2012   PLT 312.0 12/17/2012   Lab Results  Component Value Date   CREATININE 0.5 12/17/2012    BUN 11 12/17/2012   NA 132* 12/17/2012   K 4.0 12/17/2012   CL 99 12/17/2012   CO2 23 12/17/2012   Lab Results  Component Value Date   ALT 25 12/17/2012   AST 18 12/17/2012   ALKPHOS 112 12/17/2012   BILITOT 0.7 12/17/2012   Lab Results  Component Value Date   CHOL 175 12/17/2012   Lab Results  Component Value Date   HDL 41.40 12/17/2012   Lab Results  Component Value Date   LDLCALC 120* 12/17/2012   Lab Results  Component  Value Date   TRIG 69.0 12/17/2012   Lab Results  Component Value Date   CHOLHDL 4 12/17/2012     Assessment & Plan  ESSENTIAL HYPERTENSION, BENIGN Improved on recheck, no changes today  Weakness of right side of body Is being maintained on  Aspirin and is awaiting MRI to confirm CVA.   Hyperlipidemia Pravastain 10 mg daily  Diabetes mellitus type II, uncontrolled Has taken this episode seriously and has been out of work all week and stopped drinking any sodas or sweet drinks

## 2013-04-26 ENCOUNTER — Other Ambulatory Visit: Payer: Self-pay | Admitting: *Deleted

## 2013-04-26 MED ORDER — INSULIN PEN NEEDLE 31G X 8 MM MISC: Status: DC

## 2013-04-26 NOTE — Telephone Encounter (Signed)
Rx request to pharmacy/[facility]  

## 2013-04-28 ENCOUNTER — Telehealth: Payer: Self-pay | Admitting: Neurology

## 2013-04-28 ENCOUNTER — Ambulatory Visit
Admission: RE | Admit: 2013-04-28 | Discharge: 2013-04-28 | Disposition: A | Discharge status: Home / Self Care | Payer: BC Managed Care – PPO | Source: Ambulatory Visit | Attending: Neurology | Admitting: Neurology

## 2013-04-28 ENCOUNTER — Other Ambulatory Visit: Payer: Self-pay | Admitting: Neurology

## 2013-04-28 ENCOUNTER — Telehealth: Payer: Self-pay | Admitting: Family Medicine

## 2013-04-28 DIAGNOSIS — I639 Cerebral infarction, unspecified: Secondary | ICD-10-CM

## 2013-04-28 DIAGNOSIS — I6381 Other cerebral infarction due to occlusion or stenosis of small artery: Secondary | ICD-10-CM

## 2013-04-28 DIAGNOSIS — R29898 Other symptoms and signs involving the musculoskeletal system: Secondary | ICD-10-CM

## 2013-04-28 NOTE — Telephone Encounter (Signed)
FYI:   Pt states that #6 needs to be filled out on her paperwork.  I informed pt that MD is out of the office until Monday and I put paperwork on MD's desk

## 2013-04-28 NOTE — Telephone Encounter (Signed)
I called Ms. Aaronson with results of MRI.  It does show acute or subacute lacunar infarct in the left brainstem, which actually is located in an area that could cause her right-sided symptoms.  Carotid dopplers look okay.  Still waiting on results of Holter Monitor.  To be complete, I would like to check a thrombophilia profile.  Otherwise, continue with aspirin. Forde Radon

## 2013-04-28 NOTE — Telephone Encounter (Signed)
Please call patient, she has questions about that paperwork that was filled out for her.

## 2013-04-28 NOTE — Telephone Encounter (Signed)
Called and spoke with the patient. She did speak with Dr. Everlena Cooper earlier and was aware of the blood test as well as all test results. She will be in tomorrow am for the blood work.

## 2013-04-29 ENCOUNTER — Other Ambulatory Visit: Payer: BC Managed Care – PPO

## 2013-04-29 DIAGNOSIS — I6381 Other cerebral infarction due to occlusion or stenosis of small artery: Secondary | ICD-10-CM

## 2013-05-03 ENCOUNTER — Encounter: Payer: Self-pay | Admitting: Family Medicine

## 2013-05-03 ENCOUNTER — Ambulatory Visit (INDEPENDENT_AMBULATORY_CARE_PROVIDER_SITE_OTHER): Payer: BC Managed Care – PPO | Admitting: Family Medicine

## 2013-05-03 VITALS — BP 147/84 | HR 88 | Temp 97.8°F | Ht 63.0 in | Wt 139.1 lb

## 2013-05-03 DIAGNOSIS — R531 Weakness: Secondary | ICD-10-CM

## 2013-05-03 DIAGNOSIS — I10 Essential (primary) hypertension: Secondary | ICD-10-CM

## 2013-05-03 DIAGNOSIS — F32A Depression, unspecified: Secondary | ICD-10-CM

## 2013-05-03 DIAGNOSIS — M6281 Muscle weakness (generalized): Secondary | ICD-10-CM

## 2013-05-03 DIAGNOSIS — E785 Hyperlipidemia, unspecified: Secondary | ICD-10-CM

## 2013-05-03 DIAGNOSIS — E1165 Type 2 diabetes mellitus with hyperglycemia: Secondary | ICD-10-CM

## 2013-05-03 DIAGNOSIS — G609 Hereditary and idiopathic neuropathy, unspecified: Secondary | ICD-10-CM

## 2013-05-03 DIAGNOSIS — F329 Major depressive disorder, single episode, unspecified: Secondary | ICD-10-CM

## 2013-05-03 DIAGNOSIS — IMO0001 Reserved for inherently not codable concepts without codable children: Secondary | ICD-10-CM

## 2013-05-03 LAB — HYPERCOAGULABLE PANEL, COMPREHENSIVE RET.
AntiThromb III Func: 110 % (ref 76–126)
Anticardiolipin IgA: 7 APL U/mL (ref <22)
Anticardiolipin IgG: 4 GPL U/mL (ref <23)
Anticardiolipin IgM: 0 MPL U/mL (ref <11)
Beta-2 Glyco I IgG: 7 G Units (ref <20)
Beta-2-Glycoprotein I IgA: 0 A Units (ref <20)
Beta-2-Glycoprotein I IgM: 0 M Units (ref <20)
DRVVT: 38.3 secs (ref <42.9)
Homocysteine: 6.7 umol/L (ref 4.0–15.4)
Lupus Anticoagulant: NOT DETECTED
PTT Lupus Anticoagulant: 28.1 secs (ref 28.0–43.0)
Protein C Activity: 170 % — ABNORMAL HIGH (ref 75–133)
Protein C, Total: 119 % (ref 72–160)
Protein S Activity: 125 % (ref 69–129)
Protein S Total: 107 % (ref 60–150)

## 2013-05-03 MED ORDER — INSULIN GLARGINE 100 UNIT/ML ~~LOC~~ SOLN: SUBCUTANEOUS | Status: DC

## 2013-05-03 MED ORDER — AMITRIPTYLINE HCL 10 MG PO TABS: ORAL_TABLET | ORAL | Status: DC

## 2013-05-03 NOTE — Patient Instructions (Addendum)

## 2013-05-04 ENCOUNTER — Encounter: Payer: Self-pay | Admitting: Neurology

## 2013-05-04 ENCOUNTER — Ambulatory Visit (INDEPENDENT_AMBULATORY_CARE_PROVIDER_SITE_OTHER): Payer: BC Managed Care – PPO | Admitting: Neurology

## 2013-05-04 VITALS — BP 140/84 | HR 80 | Temp 98.0°F | Ht 62.0 in | Wt 139.0 lb

## 2013-05-04 DIAGNOSIS — R531 Weakness: Secondary | ICD-10-CM

## 2013-05-04 DIAGNOSIS — I6350 Cerebral infarction due to unspecified occlusion or stenosis of unspecified cerebral artery: Secondary | ICD-10-CM

## 2013-05-04 DIAGNOSIS — R0602 Shortness of breath: Secondary | ICD-10-CM

## 2013-05-04 DIAGNOSIS — M6281 Muscle weakness (generalized): Secondary | ICD-10-CM

## 2013-05-04 DIAGNOSIS — R000 Tachycardia, unspecified: Secondary | ICD-10-CM

## 2013-05-04 DIAGNOSIS — I639 Cerebral infarction, unspecified: Secondary | ICD-10-CM

## 2013-05-04 NOTE — Patient Instructions (Addendum)
1.  I will refer you to cardiology for evaluation of episodes of increased heart rate. 2.  I will refer you to physical therapy and occupational therapy. 3. Continue aspirin, diabetes control and cholesterol control. 4.  Follow up after the cardiology work up.  You have an appointment at [facility] located at [facility] in [place] on June 09, 2013 at 9:45 am on the third floor.  Please arrive 15 minutes early.   [phone].  I will refer you for PT and OT the facility located at [address].  They will call you to schedule the appointment.    [phone]

## 2013-05-04 NOTE — Progress Notes (Signed)
NEUROLOGY FOLLOW UP OFFICE NOTE  [name] [id number]  HISTORY OF PRESENT ILLNESS: [name] is a 47 year old woman with history of uncontrolled DM II, diabetic neuropathy, RLS, hyperlipidemia, HTN and PVD who presents for follow-up regarding stroke.  She is accompanied by her husband.  Records and images were personally reviewed where available.    About 3 weeks ago, she noted word-finding difficulties, balance problems, and trace right sided weakness, which improved but not resolved after a couple of days.  Since last visit, there has been no change.  She still has weakness of the right hand and right leg.  It causes problems with walking.  She also experiences a sensation of "tightness" in the muscles of her right arm and leg.  She reports recurrent episodes of sudden shortness of breath.  Holter monitor did reveal episodes of sinus tachycardia in the 130s and 140s.  Her diabetes continues to be poorly controlled.  She says her blood glucose can fluctuate anywhere from 180s to 300s.  MRI Brain (04/28/13):  Acute/subacute lacunar infarct in left lower pons.  Probable remote left thalamic lacunar infarct and chronic small vessel disease.  2D Echo (04/20/13): LVEF 55-60%, no regional wall abnormalities, no PFO.  Carotid Doppler (04/27/13): 1-39% bilateral ICA stenosis (not significant), patent vertebral arteries.  48 Hr Holter (04/25/13 - 04/27/13) preliminary results: NSR in 60s with episodes of sinus tach (136 bpm on Day 1 and 148 bpm on Day 2).  Labs (12/17/12): Hgb A1c 14.3, TSH 2.82, LDL 120 Labs (04/29/13): Hypercoagulable panel (antithrombin III, lupus anticoagulant, DRVVT, homocysteine, beta-2 glycoprotein, cardiolipin antibodies, Protein C & S, Factor V Leiden, Prothrombin II 20210A) negative.Marland Kitchen  PAST MEDICAL HISTORY: Past Medical History  Diagnosis Date  . Hypertension   . Hyperlipidemia 12/06/2010  . Overweight(278.02) 12/06/2010  . NEPHROLITHIASIS, HX OF 10/07/2010  .  RESTLESS LEG SYNDROME 10/25/2010  . PERIPHERAL NEUROPATHY, FEET 10/07/2010  . HEART MURMUR, HX OF 10/07/2010  . Essential hypertension, benign 10/07/2010  . DM 10/07/2010  . Disturbance of skin sensation 10/07/2010  . COMMON MIGRAINE 10/07/2010  . Acute bronchitis 07/08/2011  . Diabetes mellitus type II, uncontrolled 10/07/2010    Qualifier: Diagnosis of  By: Abner Greenspan MD, Misty Stanley    . PVD (peripheral vascular disease) 01/21/2012  . SOB (shortness of breath) 04/01/2012  . Depression 12/18/2012  . Weakness of right side of body 04/19/2013    MEDICATIONS: Current Outpatient Prescriptions on File Prior to Visit  Medication Sig Dispense Refill  . amitriptyline (ELAVIL) 10 MG tablet 1-2 tabs po qhs as directed  60 tablet  3  . aspirin EC 81 MG tablet Take 1 tablet (81 mg total) by mouth daily.  30 tablet    . chlorthalidone (HYGROTON) 25 MG tablet Take 1 tablet (25 mg total) by mouth daily.  30 tablet  3  . cyclobenzaprine (FLEXERIL) 5 MG tablet Take 1 tablet (5 mg total) by mouth at bedtime as needed for muscle spasms.  30 tablet  0  . glucose blood (FREESTYLE LITE) test strip 1 each by Other route daily. DX- 250.02  Pts machine is Freestyle Freedom Lite  100 each  5  . ibuprofen (ADVIL,MOTRIN) 400 MG tablet Take 1 tablet (400 mg total) by mouth 2 (two) times daily as needed for pain (with food).  60 tablet  2  . insulin glargine (LANTUS) 100 UNIT/ML injection 20 units SQ qam, may increase by 2 units every 3 days if sugars remain above 100 consistently  5  pen  6  . Insulin Pen Needle 31G X 8 MM MISC USE AS DIRECTED TO INJECT INSULIN Dx: 250.02  50 each  5  . Krill Oil CAPS Take 1 capsule by mouth daily.  30 capsule    . Lancets (FREESTYLE) lancets DX- 250.02  Pts machine is Freestyle Freedom Lite  100 each  5  . metFORMIN (GLUCOPHAGE) 1000 MG tablet 1 tab po bid and 1/2 tab po q noon  75 tablet  5  . metoprolol (TOPROL-XL) 25 MG 24 hr tablet Take 25 mg by mouth daily.        . pravastatin (PRAVACHOL)  10 MG tablet Take 1 tablet (10 mg total) by mouth daily.  30 tablet  3   No current facility-administered medications on file prior to visit.    ALLERGIES: No Known Allergies  FAMILY HISTORY: Family History  Problem Relation Age of Onset  . Arthritis Mother   . Stroke Brother     previous smoker  . Alcohol abuse Brother     in remission  . Leukemia Brother   . Diabetes Paternal Grandmother     SOCIAL HISTORY: History   Social History  . Marital Status: Married    Spouse Name: N/A    Number of Children: N/A  . Years of Education: N/A   Occupational History  . Not on file.   Social History Main Topics  . Smoking status: Former Smoker -- 0.50 packs/day    Quit date: 09/08/2005  . Smokeless tobacco: Never Used  . Alcohol Use: No     Comment: occasional  . Drug Use: No  . Sexual Activity: Yes    Partners: Male   Other Topics Concern  . Not on file   Social History Narrative  . No narrative on file    PHYSICAL EXAM: Filed Vitals:   05/04/13 1425  BP: 140/84  Pulse: 80  Temp: 98 F (36.7 C)   General: No acute distress Head:  Normocephalic/atraumatic Neck: supple, no paraspinal tenderness, full range of motion Back: No paraspinal tenderness Neurological Exam: alert and oriented to person, place, and time, speech fluent and not dysarthric, language intact.  CN II-XII intact, bulk and tone normal, muscle strength 5-/5 RUE and proximal lower extremity, sensation to light touch, temperature and vibration reduced in lower extremities bilaterally up to shin, deep tendon reflexes 3+ on right, 2+ on left except absent at ankles, right Babinski, finger to nose slow but no dysmetria, gait with limp  IMPRESSION & PLAN: Left pontine lacunar infarct, lipohylanosis vs cardio-embolic.  Location can explain the weakness, but not the word-finding difficulties (which may have been simply being distracted and scared anyway).  There is also evidence of significant white matter  disease and old lacunar infarcts. 1.  Continue ASA 81mg  daily 2. Continue statin therapy with LDL goal <70. 3. Strict control of BP and diabetes. 4. Heart-healthy, diabetic diet. 5. Regular exercise. 6. Refer to PT and OT 7. Given the episodes of sinus tachycardia and shortness of breath, I will refer to cardiology.  She may need 30 day cardiac monitoring (loop recorder) to further evaluate for possible paroxysmal atrial fibrillation). 8. Follow up in one month or after cardiology consult.  25 minutes spent with patient, over 50% spent counseling and coordinating care.  Shon Millet, DO  CC:  Danise Edge, MD

## 2013-05-05 ENCOUNTER — Ambulatory Visit: Payer: BC Managed Care – PPO | Admitting: Family Medicine

## 2013-05-05 ENCOUNTER — Telehealth: Payer: Self-pay

## 2013-05-05 NOTE — Telephone Encounter (Signed)
Spoke with insurance company they notified us that patient's forms just need to be updated to reflect new need for frequency and length of leaves due to her recent stroke, they have agreed to fax [name] a new form so we can complete it.

## 2013-05-05 NOTE — Telephone Encounter (Signed)
Pt called stating that she has talked to the [place] people and they need Dr Abner Greenspan to call them personally?   The number is [phone] the ladies name is Alphea

## 2013-05-06 ENCOUNTER — Ambulatory Visit: Payer: BC Managed Care – PPO | Admitting: Endocrinology

## 2013-05-06 NOTE — Telephone Encounter (Signed)
Form received, filled out, and faxed back.

## 2013-05-08 ENCOUNTER — Encounter: Payer: Self-pay | Admitting: Family Medicine

## 2013-05-08 NOTE — Assessment & Plan Note (Signed)
Has agreed to take Pravastatin, avoid trans fats.

## 2013-05-08 NOTE — Progress Notes (Signed)
Patient ID: ALIS SAWCHUK, female   DOB: 1965-10-19, 47 y.o.   MRN: [id number] JULIEANNA GERACI [id number] 09-06-1966 05/08/2013      Progress Note-Follow Up  Subjective  Chief Complaint  Chief Complaint  Patient presents with  . Follow-up    2 week    HPI  Patient is a 47 Caucasian female who is here today in followup. She is continuing to work but her job is very stressful and her pulses understanding. She continues to have right-sided weakness and pain. Other route finding is improving. Neurologic visit did confirm cerebrovascular accident. She has blood sugars still ranging from high 100s to low 300s. No polyuria or polydipsia. No illness or fever. No chest pain, palpitations, shortness or breath, GI or GU concerns.  Past Medical History  Diagnosis Date  . Hypertension   . Hyperlipidemia 12/06/2010  . Overweight(278.02) 12/06/2010  . NEPHROLITHIASIS, HX OF 10/07/2010  . RESTLESS LEG SYNDROME 10/25/2010  . PERIPHERAL NEUROPATHY, FEET 10/07/2010  . HEART MURMUR, HX OF 10/07/2010  . Essential hypertension, benign 10/07/2010  . DM 10/07/2010  . Disturbance of skin sensation 10/07/2010  . COMMON MIGRAINE 10/07/2010  . Acute bronchitis 07/08/2011  . Diabetes mellitus type II, uncontrolled 10/07/2010    Qualifier: Diagnosis of  By: Abner Greenspan MD, Misty Stanley    . PVD (peripheral vascular disease) 01/21/2012  . SOB (shortness of breath) 04/01/2012  . Depression 12/18/2012  . Weakness of right side of body 04/19/2013    History reviewed. No pertinent past surgical history.  Family History  Problem Relation Age of Onset  . Arthritis Mother   . Stroke Brother     previous smoker  . Alcohol abuse Brother     in remission  . Leukemia Brother   . Diabetes Paternal Grandmother     History   Social History  . Marital Status: Married    Spouse Name: N/A    Number of Children: N/A  . Years of Education: N/A   Occupational History  . Not on file.   Social History Main Topics  . Smoking  status: Former Smoker -- 0.50 packs/day    Quit date: 09/08/2005  . Smokeless tobacco: Never Used  . Alcohol Use: No     Comment: occasional  . Drug Use: No  . Sexual Activity: Yes    Partners: Male   Other Topics Concern  . Not on file   Social History Narrative  . No narrative on file    Current Outpatient Prescriptions on File Prior to Visit  Medication Sig Dispense Refill  . aspirin EC 81 MG tablet Take 1 tablet (81 mg total) by mouth daily.  30 tablet    . chlorthalidone (HYGROTON) 25 MG tablet Take 1 tablet (25 mg total) by mouth daily.  30 tablet  3  . cyclobenzaprine (FLEXERIL) 5 MG tablet Take 1 tablet (5 mg total) by mouth at bedtime as needed for muscle spasms.  30 tablet  0  . glucose blood (FREESTYLE LITE) test strip 1 each by Other route daily. DX- 250.02  Pts machine is Freestyle Freedom Lite  100 each  5  . ibuprofen (ADVIL,MOTRIN) 400 MG tablet Take 1 tablet (400 mg total) by mouth 2 (two) times daily as needed for pain (with food).  60 tablet  2  . Insulin Pen Needle 31G X 8 MM MISC USE AS DIRECTED TO INJECT INSULIN Dx: 250.02  50 each  5  . Krill Oil CAPS Take 1 capsule by mouth daily.  30 capsule    . Lancets (FREESTYLE) lancets DX- 250.02  Pts machine is Freestyle Freedom Lite  100 each  5  . metFORMIN (GLUCOPHAGE) 1000 MG tablet 1 tab po bid and 1/2 tab po q noon  75 tablet  5  . metoprolol (TOPROL-XL) 25 MG 24 hr tablet Take 25 mg by mouth daily.        . pravastatin (PRAVACHOL) 10 MG tablet Take 1 tablet (10 mg total) by mouth daily.  30 tablet  3   No current facility-administered medications on file prior to visit.    No Known Allergies  Review of Systems  Review of Systems  Constitutional: Positive for malaise/fatigue. Negative for fever.  HENT: Negative for congestion.   Eyes: Negative for discharge.  Respiratory: Negative for shortness of breath.   Cardiovascular: Negative for chest pain, palpitations and leg swelling.  Gastrointestinal:  Negative for nausea, abdominal pain and diarrhea.  Genitourinary: Negative for dysuria.  Musculoskeletal: Negative for falls.  Skin: Negative for rash.  Neurological: Positive for tingling, sensory change and focal weakness. Negative for loss of consciousness and headaches.  Endo/Heme/Allergies: Negative for polydipsia.  Psychiatric/Behavioral: Negative for depression and suicidal ideas. The patient is nervous/anxious. The patient does not have insomnia.     Objective  BP 147/84  Pulse 88  Temp(Src) 97.8 F (36.6 C) (Oral)  Ht 5\' 3"  (1.6 m)  Wt 139 lb 1.9 oz (63.104 kg)  BMI 24.65 kg/m2  SpO2 97%  Physical Exam  Physical Exam  Constitutional: She is oriented to person, place, and time and well-developed, well-nourished, and in no distress. No distress.  HENT:  Head: Normocephalic and atraumatic.  Eyes: Conjunctivae are normal.  Neck: Neck supple. No thyromegaly present.  Cardiovascular: Normal rate, regular rhythm and normal heart sounds.   No murmur heard. Pulmonary/Chest: Effort normal and breath sounds normal. She has no wheezes.  Abdominal: She exhibits no distension and no mass.  Musculoskeletal: She exhibits no edema.  Lymphadenopathy:    She has no cervical adenopathy.  Neurological: She is alert and oriented to person, place, and time.  Persistent weakness on right side, word finding improving  Skin: Skin is warm and dry. No rash noted. She is not diaphoretic.  Psychiatric: Memory, affect and judgment normal.    Lab Results  Component Value Date   TSH 2.82 12/17/2012   Lab Results  Component Value Date   WBC 6.8 12/17/2012   HGB 14.1 12/17/2012   HCT 41.4 12/17/2012   MCV 85.6 12/17/2012   PLT 312.0 12/17/2012   Lab Results  Component Value Date   CREATININE 0.5 12/17/2012   BUN 11 12/17/2012   NA 132* 12/17/2012   K 4.0 12/17/2012   CL 99 12/17/2012   CO2 23 12/17/2012   Lab Results  Component Value Date   ALT 25 12/17/2012   AST 18 12/17/2012   ALKPHOS  112 12/17/2012   BILITOT 0.7 12/17/2012   Lab Results  Component Value Date   CHOL 175 12/17/2012   Lab Results  Component Value Date   HDL 41.40 12/17/2012   Lab Results  Component Value Date   LDLCALC 120* 12/17/2012   Lab Results  Component Value Date   TRIG 69.0 12/17/2012   Lab Results  Component Value Date   CHOLHDL 4 12/17/2012     Assessment & Plan  ESSENTIAL HYPERTENSION, BENIGN Adequate control at this time. No changes today  Diabetes mellitus type II, uncontrolled Hs finally cut out all sodas  and is decreasing carbohydrate intake at this time. Numbers are improving but still running in to the 300s, she will increase her Lantus by 2 more units and she is reminded to eat small, frequent meals with lean proteins and complex carbs.  PERIPHERAL NEUROPATHY, FEET Very painful she has not tolerated meds historically  Weakness of right side of body Neurology and MRI confirmed acute CVA, agrees to continue Aspirin and follow up with neurology  Hyperlipidemia Has agreed to take Pravastatin, avoid trans fats.

## 2013-05-08 NOTE — Assessment & Plan Note (Signed)
Neurology and MRI confirmed acute CVA, agrees to continue Aspirin and follow up with neurology

## 2013-05-08 NOTE — Assessment & Plan Note (Signed)
Adequate control at this time. No changes today

## 2013-05-08 NOTE — Assessment & Plan Note (Signed)
Hs finally cut out all sodas and is decreasing carbohydrate intake at this time. Numbers are improving but still running in to the 300s, she will increase her Lantus by 2 more units and she is reminded to eat small, frequent meals with lean proteins and complex carbs.

## 2013-05-08 NOTE — Assessment & Plan Note (Signed)
Very painful she has not tolerated meds historically

## 2013-05-16 ENCOUNTER — Ambulatory Visit: Payer: BC Managed Care – PPO | Admitting: Physical Therapy

## 2013-05-18 ENCOUNTER — Ambulatory Visit: Payer: BC Managed Care – PPO | Admitting: Endocrinology

## 2013-05-18 DIAGNOSIS — Z0289 Encounter for other administrative examinations: Secondary | ICD-10-CM

## 2013-05-19 ENCOUNTER — Ambulatory Visit: Payer: BC Managed Care – PPO | Attending: Family Medicine | Admitting: Occupational Therapy

## 2013-05-23 ENCOUNTER — Ambulatory Visit (INDEPENDENT_AMBULATORY_CARE_PROVIDER_SITE_OTHER): Payer: BC Managed Care – PPO | Admitting: Family Medicine

## 2013-05-23 ENCOUNTER — Encounter: Payer: Self-pay | Admitting: Family Medicine

## 2013-05-23 VITALS — BP 148/90 | HR 87 | Temp 98.3°F | Ht 63.0 in | Wt 142.0 lb

## 2013-05-23 DIAGNOSIS — I6350 Cerebral infarction due to unspecified occlusion or stenosis of unspecified cerebral artery: Secondary | ICD-10-CM

## 2013-05-23 DIAGNOSIS — E1149 Type 2 diabetes mellitus with other diabetic neurological complication: Secondary | ICD-10-CM

## 2013-05-23 DIAGNOSIS — E785 Hyperlipidemia, unspecified: Secondary | ICD-10-CM

## 2013-05-23 DIAGNOSIS — IMO0001 Reserved for inherently not codable concepts without codable children: Secondary | ICD-10-CM

## 2013-05-23 DIAGNOSIS — Z23 Encounter for immunization: Secondary | ICD-10-CM

## 2013-05-23 DIAGNOSIS — I639 Cerebral infarction, unspecified: Secondary | ICD-10-CM

## 2013-05-23 DIAGNOSIS — I10 Essential (primary) hypertension: Secondary | ICD-10-CM

## 2013-05-23 DIAGNOSIS — E1165 Type 2 diabetes mellitus with hyperglycemia: Secondary | ICD-10-CM

## 2013-05-23 LAB — CBC
HCT: 40.5 % (ref 36.0–46.0)
Hemoglobin: 14.3 g/dL (ref 12.0–15.0)
MCH: 29.3 pg (ref 26.0–34.0)
MCHC: 35.3 g/dL (ref 30.0–36.0)
MCV: 83 fL (ref 78.0–100.0)
Platelets: 354 10*3/uL (ref 150–400)
RBC: 4.88 MIL/uL (ref 3.87–5.11)
RDW: 12.8 % (ref 11.5–15.5)
WBC: 7 10*3/uL (ref 4.0–10.5)

## 2013-05-23 LAB — HEMOGLOBIN A1C
Hgb A1c MFr Bld: 12.3 % — ABNORMAL HIGH (ref <5.7)
Mean Plasma Glucose: 306 mg/dL — ABNORMAL HIGH (ref <117)

## 2013-05-23 MED ORDER — INSULIN GLARGINE 100 UNIT/ML ~~LOC~~ SOLN: SUBCUTANEOUS | Status: DC

## 2013-05-23 MED ORDER — LISINOPRIL 5 MG PO TABS: 5.0000 mg | ORAL_TABLET | Freq: Every day | ORAL | Status: DC

## 2013-05-23 NOTE — Assessment & Plan Note (Signed)
Tolerating Pravastatin, check lipid panel today

## 2013-05-23 NOTE — Assessment & Plan Note (Signed)
Add Lisinopril today and reassess at next visit

## 2013-05-23 NOTE — Assessment & Plan Note (Signed)
Patient seeing numbers between 198 and 382 now with average around 230. Encouraged to increase Lantus further, now at 24 will go up to 26 and continue to titrate up by 2 units every 3 days as long as all numbers are above 100. Has appt with endocrinology next month for further consideration. Check hgba1c today. Continues to avoid sodas and decrease carbs

## 2013-05-23 NOTE — Progress Notes (Signed)
[name], female   DOB: 1966/08/09, 47 y.o.   MRN: [id number] [name] [id number] [birth year] 05/23/2013      Progress Note-Follow Up  Subjective  Chief Complaint  Chief Complaint  Patient presents with  . Follow-up    3 week  . Injections    flu    HPI  Patient is a 47 year old Caucasian female who is in today for followup. She has not had any new neurologic events but continues to have weakness and decreased sensation on her right side. Has returned to work and has not had any falls but does acknowledge balance has been off slightly with her weakness. Her blood sugars are labile. The low sugar recently has been 198, the highest 382 with an average of 230 reported by the patient. She has stopped also noted and is trying to eat better. Offers no new complaints today. No headache, fevers, chills, chest pain, palpitations, shortness of breath, GI or GU concerns noted  Past Medical History  Diagnosis Date  . Hypertension   . Hyperlipidemia 12/06/2010  . Overweight(278.02) 12/06/2010  . NEPHROLITHIASIS, HX OF 10/07/2010  . RESTLESS LEG SYNDROME 10/25/2010  . PERIPHERAL NEUROPATHY, FEET 10/07/2010  . HEART MURMUR, HX OF 10/07/2010  . Essential hypertension, benign 10/07/2010  . DM 10/07/2010  . Disturbance of skin sensation 10/07/2010  . COMMON MIGRAINE 10/07/2010  . Acute bronchitis 07/08/2011  . Diabetes mellitus type II, uncontrolled 10/07/2010    Qualifier: Diagnosis of  By: Abner Greenspan MD, Misty Stanley    . PVD (peripheral vascular disease) 01/21/2012  . SOB (shortness of breath) 04/01/2012  . Depression 12/18/2012  . Weakness of right side of body 04/19/2013    History reviewed. No pertinent past surgical history.  Family History  Problem Relation Age of Onset  . Arthritis Mother   . Stroke Brother     previous smoker  . Alcohol abuse Brother     in remission  . Leukemia Brother   . Diabetes Paternal Grandmother     History   Social History  . Marital Status:  Married    Spouse Name: N/A    Number of Children: N/A  . Years of Education: N/A   Occupational History  . Not on file.   Social History Main Topics  . Smoking status: Former Smoker -- 0.50 packs/day    Quit date: 09/08/2005  . Smokeless tobacco: Never Used  . Alcohol Use: No     Comment: occasional  . Drug Use: No  . Sexual Activity: Yes    Partners: Male   Other Topics Concern  . Not on file   Social History Narrative  . No narrative on file    Current Outpatient Prescriptions on File Prior to Visit  Medication Sig Dispense Refill  . aspirin EC 81 MG tablet Take 1 tablet (81 mg total) by mouth daily.  30 tablet    . chlorthalidone (HYGROTON) 25 MG tablet Take 1 tablet (25 mg total) by mouth daily.  30 tablet  3  . cyclobenzaprine (FLEXERIL) 5 MG tablet Take 1 tablet (5 mg total) by mouth at bedtime as needed for muscle spasms.  30 tablet  0  . glucose blood (FREESTYLE LITE) test strip 1 each by Other route daily. DX- 250.02  Pts machine is Freestyle Freedom Lite  100 each  5  . ibuprofen (ADVIL,MOTRIN) 400 MG tablet Take 1 tablet (400 mg total) by mouth 2 (two) times daily as needed for pain (with food).  60 tablet  2  . Insulin Pen Needle 31G X 8 MM MISC USE AS DIRECTED TO INJECT INSULIN Dx: 250.02  50 each  5  . Krill Oil CAPS Take 1 capsule by mouth daily.  30 capsule    . Lancets (FREESTYLE) lancets DX- 250.02  Pts machine is Freestyle Freedom Lite  100 each  5  . metFORMIN (GLUCOPHAGE) 1000 MG tablet 1 tab po bid and 1/2 tab po q noon  75 tablet  5  . metoprolol (TOPROL-XL) 25 MG 24 hr tablet Take 25 mg by mouth daily.        . pravastatin (PRAVACHOL) 10 MG tablet Take 1 tablet (10 mg total) by mouth daily.  30 tablet  3   No current facility-administered medications on file prior to visit.    No Known Allergies  Review of Systems  Review of Systems  Constitutional: Negative for fever and malaise/fatigue.  HENT: Negative for congestion.   Eyes: Negative  for discharge.  Respiratory: Negative for shortness of breath.   Cardiovascular: Negative for chest pain, palpitations and leg swelling.  Gastrointestinal: Negative for nausea, abdominal pain and diarrhea.  Genitourinary: Negative for dysuria.  Musculoskeletal: Negative for falls.  Skin: Negative for rash.  Neurological: Positive for tingling, sensory change and focal weakness. Negative for loss of consciousness and headaches.       Right sided weakness improving but persistent, still struggling with paresthesias  Endo/Heme/Allergies: Negative for polydipsia.  Psychiatric/Behavioral: Negative for depression and suicidal ideas. The patient is not nervous/anxious and does not have insomnia.     Objective  BP 148/90  Pulse 87  Temp(Src) 98.3 F (36.8 C) (Oral)  Ht 5\' 3"  (1.6 m)  Wt 142 lb (64.411 kg)  BMI 25.16 kg/m2  SpO2 97%  Physical Exam  Physical Exam  Constitutional: She is oriented to person, place, and time and well-developed, well-nourished, and in no distress. No distress.  HENT:  Head: Normocephalic and atraumatic.  Eyes: Conjunctivae are normal.  Neck: Neck supple. No thyromegaly present.  Cardiovascular: Normal rate, regular rhythm and normal heart sounds.   No murmur heard. Pulmonary/Chest: Effort normal and breath sounds normal. She has no wheezes.  Abdominal: She exhibits no distension and no mass.  Musculoskeletal: She exhibits no edema.  Lymphadenopathy:    She has no cervical adenopathy.  Neurological: She is alert and oriented to person, place, and time.  Skin: Skin is warm and dry. No rash noted. She is not diaphoretic.  Psychiatric: Memory, affect and judgment normal.    Lab Results  Component Value Date   TSH 2.82 12/17/2012   Lab Results  Component Value Date   WBC 6.8 12/17/2012   HGB 14.1 12/17/2012   HCT 41.4 12/17/2012   MCV 85.6 12/17/2012   PLT 312.0 12/17/2012   Lab Results  Component Value Date   CREATININE 0.5 12/17/2012   BUN 11  12/17/2012   NA 132* 12/17/2012   K 4.0 12/17/2012   CL 99 12/17/2012   CO2 23 12/17/2012   Lab Results  Component Value Date   ALT 25 12/17/2012   AST 18 12/17/2012   ALKPHOS 112 12/17/2012   BILITOT 0.7 12/17/2012   Lab Results  Component Value Date   CHOL 175 12/17/2012   Lab Results  Component Value Date   HDL 41.40 12/17/2012   Lab Results  Component Value Date   LDLCALC 120* 12/17/2012   Lab Results  Component Value Date   TRIG 69.0 12/17/2012  Lab Results  Component Value Date   CHOLHDL 4 12/17/2012     Assessment & Plan  ESSENTIAL HYPERTENSION, BENIGN Add Lisinopril today and reassess at next visit  CVA (cerebral infarction) Tolerating Aspirin daily. No new events but persistent weakness, decreased sensation on right side from face to leg.reiterated need to control BP, sugar, cholesterol and stress as much as possible  Hyperlipidemia Tolerating Pravastatin, check lipid panel today  Diabetes mellitus type II, uncontrolled Patient seeing numbers between 198 and 382 now with average around 230. Encouraged to increase Lantus further, now at 24 will go up to 26 and continue to titrate up by 2 units every 3 days as long as all numbers are above 100. Has appt with endocrinology next month for further consideration. Check hgba1c today. Continues to avoid sodas and decrease carbs

## 2013-05-23 NOTE — Patient Instructions (Addendum)

## 2013-05-23 NOTE — Assessment & Plan Note (Signed)
Tolerating Aspirin daily. No new events but persistent weakness, decreased sensation on right side from face to leg.reiterated need to control BP, sugar, cholesterol and stress as much as possible

## 2013-05-24 LAB — LIPID PANEL
Cholesterol: 171 mg/dL (ref 0–200)
HDL: 49 mg/dL (ref >39)
LDL Cholesterol: 110 mg/dL — ABNORMAL HIGH (ref 0–99)
Total CHOL/HDL Ratio: 3.5 Ratio
Triglycerides: 62 mg/dL (ref <150)
VLDL: 12 mg/dL (ref 0–40)

## 2013-05-24 LAB — HEPATIC FUNCTION PANEL
ALT: 19 U/L (ref 0–35)
AST: 15 U/L (ref 0–37)
Albumin: 3.9 g/dL (ref 3.5–5.2)
Alkaline Phosphatase: 89 U/L (ref 39–117)
Bilirubin, Direct: 0.1 mg/dL (ref 0.0–0.3)
Indirect Bilirubin: 0.5 mg/dL (ref 0.0–0.9)
Total Bilirubin: 0.6 mg/dL (ref 0.3–1.2)
Total Protein: 7.1 g/dL (ref 6.0–8.3)

## 2013-05-24 LAB — RENAL FUNCTION PANEL
Albumin: 3.9 g/dL (ref 3.5–5.2)
BUN: 12 mg/dL (ref 6–23)
CO2: 30 mEq/L (ref 19–32)
Calcium: 9.6 mg/dL (ref 8.4–10.5)
Chloride: 97 mEq/L (ref 96–112)
Creat: 0.51 mg/dL (ref 0.50–1.10)
Glucose, Bld: 201 mg/dL — ABNORMAL HIGH (ref 70–99)
Phosphorus: 3.6 mg/dL (ref 2.3–4.6)
Potassium: 4.5 mEq/L (ref 3.5–5.3)
Sodium: 133 mEq/L — ABNORMAL LOW (ref 135–145)

## 2013-05-24 LAB — TSH: TSH: 2.574 u[IU]/mL (ref 0.350–4.500)

## 2013-06-09 ENCOUNTER — Ambulatory Visit (INDEPENDENT_AMBULATORY_CARE_PROVIDER_SITE_OTHER): Payer: BC Managed Care – PPO | Admitting: Cardiovascular Disease

## 2013-06-09 ENCOUNTER — Encounter: Payer: Self-pay | Admitting: Cardiovascular Disease

## 2013-06-09 VITALS — BP 142/78 | HR 82 | Ht 62.5 in | Wt 141.0 lb

## 2013-06-09 DIAGNOSIS — Z8679 Personal history of other diseases of the circulatory system: Secondary | ICD-10-CM

## 2013-06-09 DIAGNOSIS — IMO0002 Reserved for concepts with insufficient information to code with codable children: Secondary | ICD-10-CM

## 2013-06-09 DIAGNOSIS — E1165 Type 2 diabetes mellitus with hyperglycemia: Secondary | ICD-10-CM

## 2013-06-09 DIAGNOSIS — I6350 Cerebral infarction due to unspecified occlusion or stenosis of unspecified cerebral artery: Secondary | ICD-10-CM

## 2013-06-09 DIAGNOSIS — R0600 Dyspnea, unspecified: Secondary | ICD-10-CM

## 2013-06-09 DIAGNOSIS — R0609 Other forms of dyspnea: Secondary | ICD-10-CM

## 2013-06-09 DIAGNOSIS — R0989 Other specified symptoms and signs involving the circulatory and respiratory systems: Secondary | ICD-10-CM

## 2013-06-09 DIAGNOSIS — IMO0001 Reserved for inherently not codable concepts without codable children: Secondary | ICD-10-CM

## 2013-06-09 DIAGNOSIS — E785 Hyperlipidemia, unspecified: Secondary | ICD-10-CM

## 2013-06-09 DIAGNOSIS — E119 Type 2 diabetes mellitus without complications: Secondary | ICD-10-CM

## 2013-06-09 DIAGNOSIS — I639 Cerebral infarction, unspecified: Secondary | ICD-10-CM

## 2013-06-09 DIAGNOSIS — R0602 Shortness of breath: Secondary | ICD-10-CM

## 2013-06-09 NOTE — Assessment & Plan Note (Signed)
Present but benign outflow tract murmur with no valve disease by recent echo

## 2013-06-09 NOTE — Assessment & Plan Note (Signed)
Cholesterol is at goal.  Continue current dose of statin and diet Rx.  No myalgias or side effects.  F/U  LFT's in 6 months. Lab Results  Component Value Date   LDLCALC 110* 05/23/2013

## 2013-06-09 NOTE — Assessment & Plan Note (Signed)
Lacunar not helped by poorly controlled DM Increase lisinopril as needed for tight BP control.  ASA and plavix Stroke was not embolic or related to PAF

## 2013-06-09 NOTE — Patient Instructions (Signed)
Your physician recommends that you schedule a follow-up appointment in: AS NEEDED Your physician recommends that you continue on your current medications as directed. Please refer to the Current Medication list given to you today. Your physician has requested that you have an exercise tolerance test. For further information please visit [email or web address]. Please also follow instruction sheet, as given.  

## 2013-06-09 NOTE — Assessment & Plan Note (Signed)
Previous smoker Normal ECG and CXR  F/U ETT to r/o anginal equivalent and CAD given poorly controlled DM  EF normal by echo

## 2013-06-09 NOTE — Assessment & Plan Note (Signed)
Needs much better control  She should be seen by endocrinologist Will send note to Dr Rogelia Rohrer to suggest  Referral to [place]

## 2013-06-09 NOTE — Progress Notes (Signed)
[name], female   DOB: 1966-08-16, 47 y.o.   MRN: [id number] 47 yo referred for fatigue and dyspnea. She had a recent CVA with right sided weakness.  Her BS;s are horribly controlled with A1c ranging from 12-14.  She tries to watch her diet.   August MRI showed left sided lacunar stroke not related to embolic event.  No chest pain palpitations or syncope.  Mild exertional dyspnea.  Has cataract on left eye but no retinopathy Has not had recent stress test. Claims to be compliant with meds.  Says her BS runs 180-200 at home   Has already had extensive w/u  Echo August reviewed normal no valve disease or pulmonary hypertension EF normal Holter August normal Baseline ECG normal  ROS: Denies fever, malais, weight loss, blurry vision, decreased visual acuity, cough, sputum, SOB, hemoptysis, pleuritic pain, palpitaitons, heartburn, abdominal pain, melena, lower extremity edema, claudication, or rash.  All other systems reviewed and negative   General: Affect appropriate Healthy:  appears stated age HEENT: normal Neck supple with no adenopathy JVP normal no bruits no thyromegaly Lungs clear with no wheezing and good diaphragmatic motion Heart:  S1/S2 SEM no ,rub, gallop or click PMI normal Abdomen: benighn, BS positve, no tenderness, no AAA no bruit.  No HSM or HJR Distal pulses intact with no bruits No edema Neuro non-focal Skin warm and dry No muscular weakness  Medications Current Outpatient Prescriptions  Medication Sig Dispense Refill  . amitriptyline (ELAVIL) 10 MG tablet Take 10 mg by mouth at bedtime as needed.      Marland Kitchen aspirin EC 81 MG tablet Take 1 tablet (81 mg total) by mouth daily.  30 tablet    . clopidogrel (PLAVIX) 75 MG tablet Take 75 mg by mouth daily.      . cyclobenzaprine (FLEXERIL) 5 MG tablet Take 1 tablet (5 mg total) by mouth at bedtime as needed for muscle spasms.  30 tablet  0  . glucose blood (FREESTYLE LITE) test strip 1 each by Other route  daily. DX- 250.02  Pts machine is Freestyle Freedom Lite  100 each  5  . ibuprofen (ADVIL,MOTRIN) 400 MG tablet Take 1 tablet (400 mg total) by mouth 2 (two) times daily as needed for pain (with food).  60 tablet  2  . insulin glargine (LANTUS) 100 UNIT/ML injection 26 units SQ qam, may increase by 2 units every 3 days if sugars remain above 100 consistently  5 pen  6  . Insulin Pen Needle 31G X 8 MM MISC USE AS DIRECTED TO INJECT INSULIN Dx: 250.02  50 each  5  . Krill Oil CAPS Take 1 capsule by mouth daily.  30 capsule    . Lancets (FREESTYLE) lancets DX- 250.02  Pts machine is Freestyle Freedom Lite  100 each  5  . lisinopril (PRINIVIL,ZESTRIL) 5 MG tablet Take 1 tablet (5 mg total) by mouth daily.  30 tablet  3  . metFORMIN (GLUCOPHAGE) 1000 MG tablet 1 tab po bid and 1/2 tab po q noon  75 tablet  5  . metoprolol (TOPROL-XL) 25 MG 24 hr tablet Take 25 mg by mouth daily.        . pravastatin (PRAVACHOL) 10 MG tablet Take 1 tablet (10 mg total) by mouth daily.  30 tablet  3  . chlorthalidone (HYGROTON) 25 MG tablet Take 1 tablet (25 mg total) by mouth daily.  30 tablet  3   No current facility-administered medications for this visit.  Allergies Review of patient's allergies indicates no known allergies.  Family History: Family History  Problem Relation Age of Onset  . Arthritis Mother   . Stroke Brother     previous smoker  . Alcohol abuse Brother     in remission  . Leukemia Brother   . Diabetes Paternal Grandmother     Social History: History   Social History  . Marital Status: Married    Spouse Name: N/A    Number of Children: N/A  . Years of Education: N/A   Occupational History  . Not on file.   Social History Main Topics  . Smoking status: Former Smoker -- 0.50 packs/day    Quit date: 09/08/2005  . Smokeless tobacco: Never Used  . Alcohol Use: No     Comment: occasional  . Drug Use: No  . Sexual Activity: Yes    Partners: Male   Other Topics Concern   . Not on file   Social History Narrative  . No narrative on file    Electrocardiogram:  NSR normal ECG   Assessment and Plan

## 2013-06-21 ENCOUNTER — Ambulatory Visit: Payer: BC Managed Care – PPO | Admitting: Neurology

## 2013-06-24 ENCOUNTER — Ambulatory Visit: Payer: BC Managed Care – PPO | Admitting: Family Medicine

## 2013-06-30 ENCOUNTER — Ambulatory Visit: Payer: BC Managed Care – PPO | Admitting: Family Medicine

## 2013-07-07 ENCOUNTER — Encounter: Payer: BC Managed Care – PPO | Admitting: Physician Assistant

## 2013-07-08 ENCOUNTER — Encounter: Payer: Self-pay | Admitting: Physician Assistant

## 2013-08-19 ENCOUNTER — Ambulatory Visit (INDEPENDENT_AMBULATORY_CARE_PROVIDER_SITE_OTHER): Payer: BC Managed Care – PPO | Admitting: Physician Assistant

## 2013-08-19 ENCOUNTER — Encounter: Payer: Self-pay | Admitting: Physician Assistant

## 2013-08-19 VITALS — BP 136/92 | HR 86 | Temp 98.1°F | Wt 143.0 lb

## 2013-08-19 DIAGNOSIS — G609 Hereditary and idiopathic neuropathy, unspecified: Secondary | ICD-10-CM

## 2013-08-19 NOTE — Patient Instructions (Signed)
Please follow-up with Dr. Abner Greenspan at your scheduled appointment.  Make sure to take your Lisinopril that day so we can recheck you BP and get an accurate measure of how it runs while on medication.  Please follow-up with Cardiology and Endocrinology.  It is very important for you to do so.  I will call you once I have completed your FMLA paperwork.

## 2013-08-19 NOTE — Progress Notes (Signed)
Pre-visit discussion using our clinic review tool. No additional management support is needed unless otherwise documented below in the visit note.  

## 2013-08-21 NOTE — Progress Notes (Signed)
[name], female   DOB: 10-30-1965, 47 y.o.   MRN: [id number]  Patient presents to clinic today requesting FMLA for her peripheral neuropathy.  Patient has been seen several times for this condition and has been evaluated by vascular surgery.  Patient has diagnosis of DM II and has suffered a CVA in the recent past.  Patient has been intolerant to medications in the past.  Patient takes Elavil at bedtime which helps with sleep and neuropathic pain.  Patient endorses persistent numbness, tingling and burning sensation of bilateral feet.  States symptoms are better some days than others.  Is requesting the paperwork so that she will not be penalized for missing work when her symptoms "flare-up." Patient denies recent fall.  Has some residual r-sided weakness 2/2 CVA, but feels her strength continues to improve.     Past Medical History  Diagnosis Date  . Hypertension   . Hyperlipidemia 12/06/2010  . Overweight(278.02) 12/06/2010  . NEPHROLITHIASIS, HX OF 10/07/2010  . RESTLESS LEG SYNDROME 10/25/2010  . PERIPHERAL NEUROPATHY, FEET 10/07/2010  . HEART MURMUR, HX OF 10/07/2010  . Essential hypertension, benign 10/07/2010  . DM 10/07/2010  . Disturbance of skin sensation 10/07/2010  . COMMON MIGRAINE 10/07/2010  . Acute bronchitis 07/08/2011  . Diabetes mellitus type II, uncontrolled 10/07/2010    Qualifier: Diagnosis of  By: Abner Greenspan MD, Misty Stanley    . PVD (peripheral vascular disease) 01/21/2012  . SOB (shortness of breath) 04/01/2012  . Depression 12/18/2012  . Weakness of right side of body 04/19/2013  . CVA (cerebral infarction) 04/19/2013    Current Outpatient Prescriptions on File Prior to Visit  Medication Sig Dispense Refill  . amitriptyline (ELAVIL) 10 MG tablet Take 10 mg by mouth at bedtime as needed.      Marland Kitchen aspirin EC 81 MG tablet Take 1 tablet (81 mg total) by mouth daily.  30 tablet    . clopidogrel (PLAVIX) 75 MG tablet Take 75 mg by mouth daily.      . cyclobenzaprine  (FLEXERIL) 5 MG tablet Take 1 tablet (5 mg total) by mouth at bedtime as needed for muscle spasms.  30 tablet  0  . glucose blood (FREESTYLE LITE) test strip 1 each by Other route daily. DX- 250.02  Pts machine is Freestyle Freedom Lite  100 each  5  . ibuprofen (ADVIL,MOTRIN) 400 MG tablet Take 1 tablet (400 mg total) by mouth 2 (two) times daily as needed for pain (with food).  60 tablet  2  . insulin glargine (LANTUS) 100 UNIT/ML injection 26 units SQ qam, may increase by 2 units every 3 days if sugars remain above 100 consistently  5 pen  6  . Insulin Pen Needle 31G X 8 MM MISC USE AS DIRECTED TO INJECT INSULIN Dx: 250.02  50 each  5  . Krill Oil CAPS Take 1 capsule by mouth daily.  30 capsule    . Lancets (FREESTYLE) lancets DX- 250.02  Pts machine is Freestyle Freedom Lite  100 each  5  . lisinopril (PRINIVIL,ZESTRIL) 5 MG tablet Take 1 tablet (5 mg total) by mouth daily.  30 tablet  3  . metFORMIN (GLUCOPHAGE) 1000 MG tablet 1 tab po bid and 1/2 tab po q noon  75 tablet  5  . metoprolol (TOPROL-XL) 25 MG 24 hr tablet Take 25 mg by mouth daily.        . pravastatin (PRAVACHOL) 10 MG tablet Take 1 tablet (10 mg total) by mouth daily.  30 tablet  3  . chlorthalidone (HYGROTON) 25 MG tablet Take 1 tablet (25 mg total) by mouth daily.  30 tablet  3   No current facility-administered medications on file prior to visit.    No Known Allergies  Family History  Problem Relation Age of Onset  . Arthritis Mother   . Stroke Brother     previous smoker  . Alcohol abuse Brother     in remission  . Leukemia Brother   . Diabetes Paternal Grandmother     History   Social History  . Marital Status: Married    Spouse Name: N/A    Number of Children: N/A  . Years of Education: N/A   Social History Main Topics  . Smoking status: Former Smoker -- 0.50 packs/day    Quit date: 09/08/2005  . Smokeless tobacco: Never Used  . Alcohol Use: No     Comment: occasional  . Drug Use: No  .  Sexual Activity: Yes    Partners: Male   Other Topics Concern  . None   Social History Narrative  . None   Review of Systems - See HPI.  All other ROS are negative.  Filed Vitals:   08/19/13 0920  BP: 136/92  Pulse: 86  Temp: 98.1 F (36.7 C)   Physical Exam  Vitals reviewed. Constitutional: She is oriented to person, place, and time and well-developed, well-nourished, and in no distress.  HENT:  Head: Normocephalic and atraumatic.  Eyes: Conjunctivae are normal. Pupils are equal, round, and reactive to light.  Neck: Neck supple.  Cardiovascular: Normal rate, regular rhythm, normal heart sounds and intact distal pulses.   Pulmonary/Chest: Effort normal and breath sounds normal. No respiratory distress. She has no wheezes. She has no rales. She exhibits no tenderness.  Neurological: She is alert and oriented to person, place, and time. She has normal sensation and intact cranial nerves. No cranial nerve deficit. Gait normal.  Strength 4/5 right-sided upper and lower extremities.  Strength 5/5 elsewhere.  Skin: Skin is warm and dry. No rash noted.  Psychiatric: Affect normal.   Assessment/Plan: No problem-specific assessment & plan notes found for this encounter.

## 2013-08-21 NOTE — Assessment & Plan Note (Signed)
Patient evaluated.  FMLA granted.  Will fax paperwork to the appropriate recipient.  Follow-up with PCP as scheduled.

## 2013-09-09 ENCOUNTER — Encounter: Payer: Self-pay | Admitting: Physician Assistant

## 2013-09-09 ENCOUNTER — Ambulatory Visit (INDEPENDENT_AMBULATORY_CARE_PROVIDER_SITE_OTHER): Payer: BC Managed Care – PPO | Admitting: Physician Assistant

## 2013-09-09 VITALS — BP 142/92 | HR 92 | Temp 98.2°F | Resp 16 | Ht 62.5 in | Wt 142.5 lb

## 2013-09-09 DIAGNOSIS — H60391 Other infective otitis externa, right ear: Secondary | ICD-10-CM

## 2013-09-09 DIAGNOSIS — H60399 Other infective otitis externa, unspecified ear: Secondary | ICD-10-CM | POA: Insufficient documentation

## 2013-09-09 MED ORDER — CIPROFLOXACIN-DEXAMETHASONE 0.3-0.1 % OT SUSP: 4.0000 [drp] | Freq: Two times a day (BID) | OTIC | Status: DC

## 2013-09-09 MED ORDER — ANTIPYRINE-BENZOCAINE 5.4-1.4 % OT SOLN
3.0000 [drp] | OTIC | Status: DC | PRN
Start: 2013-09-09 — End: 2013-11-07

## 2013-09-09 NOTE — Progress Notes (Signed)
Pre visit review using our clinic review tool, if applicable. No additional management support is needed unless otherwise documented below in the visit note/SLS  

## 2013-09-09 NOTE — Patient Instructions (Signed)
Please use medications as prescribed.  Avoid use of Q-tips or irrigation of ear.  If symptoms, not improving, please call or return to clinic.  Otitis Externa Otitis externa is a bacterial or fungal infection of the outer ear canal. This is the area from the eardrum to the outside of the ear. Otitis externa is sometimes called "swimmer's ear." CAUSES  Possible causes of infection include:  Swimming in dirty water.  Moisture remaining in the ear after swimming or bathing.  Mild injury (trauma) to the ear.  Objects stuck in the ear (foreign body).  Cuts or scrapes (abrasions) on the outside of the ear. SYMPTOMS  The first symptom of infection is often itching in the ear canal. Later signs and symptoms may include swelling and redness of the ear canal, ear pain, and yellowish-white fluid (pus) coming from the ear. The ear pain may be worse when pulling on the earlobe. DIAGNOSIS  Your caregiver will perform a physical exam. A sample of fluid may be taken from the ear and examined for bacteria or fungi. TREATMENT  Antibiotic ear drops are often given for 10 to 14 days. Treatment may also include pain medicine or corticosteroids to reduce itching and swelling. PREVENTION   Keep your ear dry. Use the corner of a towel to absorb water out of the ear canal after swimming or bathing.  Avoid scratching or putting objects inside your ear. This can damage the ear canal or remove the protective wax that lines the canal. This makes it easier for bacteria and fungi to grow.  Avoid swimming in lakes, polluted water, or poorly chlorinated pools.  You may use ear drops made of rubbing alcohol and vinegar after swimming. Combine equal parts of white vinegar and alcohol in a bottle. Put 3 or 4 drops into each ear after swimming. HOME CARE INSTRUCTIONS   Apply antibiotic ear drops to the ear canal as prescribed by your caregiver.  Only take over-the-counter or prescription medicines for pain,  discomfort, or fever as directed by your caregiver.  If you have diabetes, follow any additional treatment instructions from your caregiver.  Keep all follow-up appointments as directed by your caregiver. SEEK MEDICAL CARE IF:   You have a fever.  Your ear is still red, swollen, painful, or draining pus after 3 days.  Your redness, swelling, or pain gets worse.  You have a severe headache.  You have redness, swelling, pain, or tenderness in the area behind your ear. MAKE SURE YOU:   Understand these instructions.  Will watch your condition.  Will get help right away if you are not doing well or get worse. Document Released: 08/25/2005 Document Revised: 11/17/2011 Document Reviewed: 09/11/2011 [facility] Patient Information 2014 [place].

## 2013-09-09 NOTE — Assessment & Plan Note (Signed)
Cerumen removed irrigation. Rx Ciprodex. Rx Auralgan for ear pain. Patient to keep water out of the ear. Return to clinic if symptoms not improving.

## 2013-09-09 NOTE — Progress Notes (Signed)
[name], female   DOB: 07/18/1966, 48 y.o.   MRN: [id number]  Patient presents to clinic today complaining of pain and pressure in her right ear has been present for one week. Patient denies fever, cough, wheezing or other URI symptoms. Patient denies tinnitus. Patient denies decrease in hearing. Patient states she has occasionally felt slightly off balance. Denies lightheadedness or syncope. Denies nausea or vomiting. Denies similar symptoms of left ear.  Past Medical History  Diagnosis Date  . Hypertension   . Hyperlipidemia 12/06/2010  . Overweight(278.02) 12/06/2010  . NEPHROLITHIASIS, HX OF 10/07/2010  . RESTLESS LEG SYNDROME 10/25/2010  . PERIPHERAL NEUROPATHY, FEET 10/07/2010  . HEART MURMUR, HX OF 10/07/2010  . Essential hypertension, benign 10/07/2010  . DM 10/07/2010  . Disturbance of skin sensation 10/07/2010  . COMMON MIGRAINE 10/07/2010  . Acute bronchitis 07/08/2011  . Diabetes mellitus type II, uncontrolled 10/07/2010    Qualifier: Diagnosis of  By: Charlett Blake MD, [name]    . PVD (peripheral vascular disease) 01/21/2012  . SOB (shortness of breath) 04/01/2012  . Depression 12/18/2012  . Weakness of right side of body 04/19/2013  . CVA (cerebral infarction) 04/19/2013    Current Outpatient Prescriptions on File Prior to Visit  Medication Sig Dispense Refill  . amitriptyline (ELAVIL) 10 MG tablet Take 10 mg by mouth at bedtime as needed.      Marland Kitchen aspirin EC 81 MG tablet Take 1 tablet (81 mg total) by mouth daily.  30 tablet    . chlorthalidone (HYGROTON) 25 MG tablet Take 1 tablet (25 mg total) by mouth daily.  30 tablet  3  . clopidogrel (PLAVIX) 75 MG tablet Take 75 mg by mouth daily.      . cyclobenzaprine (FLEXERIL) 5 MG tablet Take 1 tablet (5 mg total) by mouth at bedtime as needed for muscle spasms.  30 tablet  0  . glucose blood (FREESTYLE LITE) test strip 1 each by Other route daily. DX- 250.02  Pts machine is Freestyle Freedom Lite  100 each  5  . ibuprofen  (ADVIL,MOTRIN) 400 MG tablet Take 1 tablet (400 mg total) by mouth 2 (two) times daily as needed for pain (with food).  60 tablet  2  . insulin glargine (LANTUS) 100 UNIT/ML injection 26 units SQ qam, may increase by 2 units every 3 days if sugars remain above 100 consistently  5 pen  6  . Insulin Pen Needle 31G X 8 MM MISC USE AS DIRECTED TO INJECT INSULIN Dx: 250.02  50 each  5  . Krill Oil CAPS Take 1 capsule by mouth daily.  30 capsule    . Lancets (FREESTYLE) lancets DX- 250.02  Pts machine is Freestyle Freedom Lite  100 each  5  . lisinopril (PRINIVIL,ZESTRIL) 5 MG tablet Take 1 tablet (5 mg total) by mouth daily.  30 tablet  3  . metFORMIN (GLUCOPHAGE) 1000 MG tablet 1 tab po bid and 1/2 tab po q noon  75 tablet  5  . metoprolol (TOPROL-XL) 25 MG 24 hr tablet Take 25 mg by mouth daily.        . pravastatin (PRAVACHOL) 10 MG tablet Take 1 tablet (10 mg total) by mouth daily.  30 tablet  3   No current facility-administered medications on file prior to visit.    No Known Allergies  Family History  Problem Relation Age of Onset  . Arthritis Mother   . Stroke Brother     previous smoker  .  Alcohol abuse Brother     in remission  . Leukemia Brother   . Diabetes Paternal Grandmother     History   Social History  . Marital Status: Married    Spouse Name: N/A    Number of Children: N/A  . Years of Education: N/A   Social History Main Topics  . Smoking status: Former Smoker -- 0.50 packs/day    Quit date: 09/08/2005  . Smokeless tobacco: Never Used  . Alcohol Use: No     Comment: occasional  . Drug Use: No  . Sexual Activity: Yes    Partners: Male   Other Topics Concern  . None   Social History Narrative  . None   Review of Systems - See HPI.  All other ROS are negative.  Filed Vitals:   09/09/13 0919  BP: 142/92  Pulse: 92  Temp: 98.2 F (36.8 C)  Resp: 16   Physical Exam  Vitals reviewed. Constitutional: She is oriented to person, place, and time and  well-developed, well-nourished, and in no distress.  HENT:  Head: Normocephalic and atraumatic.  Right Ear: External ear normal.  Left Ear: External ear normal.  Nose: Nose normal.  Mouth/Throat: Oropharynx is clear and moist. No oropharyngeal exudate.  Left ear canal and tympanic membrane within normal limits. Right ear canal and tympanic membrane occluded by cerumen. Cerumen removed the irrigation. Right tympanic membrane within normal limits. Right ear canal with moderate swelling and significant erythema.  Eyes: Conjunctivae are normal. Pupils are equal, round, and reactive to light.  Neck: Neck supple.  Cardiovascular: Normal rate, regular rhythm, normal heart sounds and intact distal pulses.   Pulmonary/Chest: Effort normal and breath sounds normal. No respiratory distress. She has no wheezes. She has no rales. She exhibits no tenderness.  Lymphadenopathy:    She has no cervical adenopathy.  Neurological: She is alert and oriented to person, place, and time.  Skin: Skin is warm and dry. No rash noted.  Psychiatric: Affect normal.   Assessment/Plan: Otitis, externa, infective Cerumen removed irrigation. Rx Ciprodex. Rx Auralgan for ear pain. Patient to keep water out of the ear. Return to clinic if symptoms not improving.

## 2013-09-22 ENCOUNTER — Ambulatory Visit: Payer: BC Managed Care – PPO | Admitting: Family Medicine

## 2013-09-26 ENCOUNTER — Ambulatory Visit: Payer: BC Managed Care – PPO | Admitting: Family Medicine

## 2013-09-30 ENCOUNTER — Ambulatory Visit: Payer: BC Managed Care – PPO | Admitting: Family Medicine

## 2013-11-07 ENCOUNTER — Encounter: Payer: Self-pay | Admitting: Family Medicine

## 2013-11-07 ENCOUNTER — Ambulatory Visit (INDEPENDENT_AMBULATORY_CARE_PROVIDER_SITE_OTHER): Payer: BC Managed Care – PPO | Admitting: Family Medicine

## 2013-11-07 VITALS — BP 162/98 | HR 92 | Temp 98.0°F | Ht 62.5 in | Wt 143.0 lb

## 2013-11-07 DIAGNOSIS — E1149 Type 2 diabetes mellitus with other diabetic neurological complication: Secondary | ICD-10-CM

## 2013-11-07 DIAGNOSIS — E785 Hyperlipidemia, unspecified: Secondary | ICD-10-CM

## 2013-11-07 DIAGNOSIS — E1165 Type 2 diabetes mellitus with hyperglycemia: Secondary | ICD-10-CM

## 2013-11-07 DIAGNOSIS — E119 Type 2 diabetes mellitus without complications: Secondary | ICD-10-CM

## 2013-11-07 DIAGNOSIS — G609 Hereditary and idiopathic neuropathy, unspecified: Secondary | ICD-10-CM

## 2013-11-07 DIAGNOSIS — B351 Tinea unguium: Secondary | ICD-10-CM

## 2013-11-07 DIAGNOSIS — IMO0002 Reserved for concepts with insufficient information to code with codable children: Secondary | ICD-10-CM

## 2013-11-07 DIAGNOSIS — IMO0001 Reserved for inherently not codable concepts without codable children: Secondary | ICD-10-CM

## 2013-11-07 DIAGNOSIS — E1142 Type 2 diabetes mellitus with diabetic polyneuropathy: Secondary | ICD-10-CM

## 2013-11-07 DIAGNOSIS — I10 Essential (primary) hypertension: Secondary | ICD-10-CM

## 2013-11-07 LAB — CBC
HCT: 40.3 % (ref 36.0–46.0)
Hemoglobin: 14 g/dL (ref 12.0–15.0)
MCH: 29.8 pg (ref 26.0–34.0)
MCHC: 34.7 g/dL (ref 30.0–36.0)
MCV: 85.7 fL (ref 78.0–100.0)
Platelets: 330 10*3/uL (ref 150–400)
RBC: 4.7 MIL/uL (ref 3.87–5.11)
RDW: 13.3 % (ref 11.5–15.5)
WBC: 8.3 10*3/uL (ref 4.0–10.5)

## 2013-11-07 LAB — HEMOGLOBIN A1C
Hgb A1c MFr Bld: 12.7 % — ABNORMAL HIGH (ref <5.7)
Mean Plasma Glucose: 318 mg/dL — ABNORMAL HIGH (ref <117)

## 2013-11-07 MED ORDER — EFINACONAZOLE 10 % EX SOLN: 1.0000 "application " | Freq: Every day | CUTANEOUS | Status: DC

## 2013-11-07 MED ORDER — PREGABALIN 50 MG PO CAPS: ORAL_CAPSULE | ORAL | Status: DC

## 2013-11-07 MED ORDER — LISINOPRIL 10 MG PO TABS: 10.0000 mg | ORAL_TABLET | Freq: Two times a day (BID) | ORAL | Status: DC

## 2013-11-07 NOTE — Progress Notes (Signed)
Pre visit review using our clinic review tool, if applicable. No additional management support is needed unless otherwise documented below in the visit note. 

## 2013-11-07 NOTE — Patient Instructions (Signed)
Diabetic Neuropathy Diabetic neuropathy is a nerve disease or nerve damage that is caused by diabetes mellitus. About half of all people with diabetes mellitus have some form of nerve damage. Nerve damage is more common in those who have had diabetes mellitus for many years and who generally have not had good control of their blood sugar (glucose) level. Diabetic neuropathy is a common complication of diabetes mellitus. There are three more common types of diabetic neuropathy and a fourth type that is less common and less understood:   Peripheral neuropathy This is the most common type of diabetic neuropathy. It causes damage to the nerves of the feet and legs first and then eventually the hands and arms.The damage affects the ability to sense touch.  Autonomic neuropathy This type causes damage to the autonomic nervous system, which controls the following functions:  Heartbeat.  Body temperature.  Blood pressure.  Urination.  Digestion.  Sweating.  Sexual function.  Focal neuropathy Focal neuropathy can be painful and unpredictable and occurs most often in older adults with diabetes mellitus. It involves a specific nerve or one area and often comes on suddenly. It usually does not cause long-term problems.  Radiculoplexus neuropathy Sometimes called lumbosacral radiculoplexus neuropathy, radiculoplexus neuropathy affects the nerves of the thighs, hips, buttocks, or legs. It is more common in people with type 2 diabetes mellitus and in older men. It is characterized by debilitating pain, weakness, and atrophy, usually in the thigh muscles. CAUSES  The cause of peripheral, autonomic, and focal neuropathies is diabetes mellitus that is uncontrolled and high glucose levels. The cause of radiculoplexus neuropathy is unknown. However, it is thought to be caused by inflammation related to uncontrolled glucose levels. SIGNS AND SYMPTOMS  Peripheral Neuropathy Peripheral neuropathy develops  slowly over time. When the nerves of the feet and legs no longer work there may be:   Burning, stabbing, or aching pain in the legs or feet.  Inability to feel pressure or pain in your feet. This can lead to:  Thick calluses over pressure areas.  Pressure sores.  Ulcers.  Foot deformities.  Reduced ability to feel temperature changes.  Muscle weakness. Autonomic Neuropathy The symptoms of autonomic neuropathy vary depending on which nerves are affected. Symptoms may include:  Problems with digestion, such as:  Feeling sick to your stomach (nausea).  Vomiting.  Bloating.  Constipation.  Diarrhea.  Abdominal pain.  Difficulty with urination. This occurs if you lose your ability to sense when your bladder is full. Problems include:  Urine leakage (incontinence).  Inability to empty your bladder completely (retention).  Rapid or irregular heartbeat (palpitations).  Blood pressure drops when you stand up (orthostatic hypotension). When you stand up you may feel:  Dizzy.  Weak.  Faint.  In men, inability to attain and maintain an erection.  In women, vaginal dryness and problems with decreased sexual desire and arousal.  Problems with body temperature regulation.  Increased or decreased sweating. Focal Neuropathy  Abnormal eye movements or abnormal alignment of both eyes.  Weakness in the wrist.  Foot drop. This results in an inability to lift the foot properly and abnormal walking or foot movement.  Paralysis on one side of your face (Bell palsy).  Chest or abdominal pain. Radiculoplexus Neuropathy  Sudden, severe pain in your hip, thigh, or buttocks.  Weakness and wasting of thigh muscles.  Difficulty rising from a seated position.  Abdominal swelling.  Unexplained weight loss (usually more than 10 lb [4.5 kg]). DIAGNOSIS  Peripheral Neuropathy   Your senses may be tested. Sensory function testing can be done with:  A light touch using a  monofilament.  A vibration with tuning fork.  A sharp sensation with a pin prick. Other tests that can help diagnose neuropathy are:  Nerve conduction velocity. This test checks the transmission of an electrical current through a nerve.  Electromyography. This shows how muscles respond to electrical signals transmitted by nearby nerves.  Quantitative sensory testing. This is used to assess how your nerves respond to vibrations and changes in temperature. Autonomic Neuropathy Diagnosis is often based on reported symptoms. Tell your health care provider if you experience:   Dizziness.   Constipation.   Diarrhea.   Inappropriate urination or inability to urinate.   Inability to get or maintain an erection.  Tests that may be done include:   Electrocardiography or Holter monitor. These are tests that can help show problems with the heart rate or heart rhythm.   An X-ray exam may be done. Focal Neuropathy Diagnosis is made based on your symptoms and what your health care provider finds during your exam. Other tests may be done. They may include:  Nerve conduction velocities. This checks the transmission of electrical current through a nerve.  Electromyography. This shows how muscles respond to electrical signals transmitted by nearby nerves.  Quantitative sensory testing. This test is used to assess how your nerves respond to vibration and changes in temperature. Radiculoplexus Neuropathy  Often the first thing is to eliminate any other issue or problems that might be the cause, as there is no stick test for diagnosis.  X-ray exam of your spine and lumbar region.  Spinal tap to rule out cancer.  MRI to rule out other lesions. TREATMENT  Once nerve damage occurs, it cannot be reversed. The goal of treatment is to keep the disease or nerve damage from getting worse and affecting more nerve fibers. Controlling your blood glucose level is the key. Most people with  radiculoplexus neuropathy see at least a partial improvement over time. You will need to keep your blood glucose and HbA1c levels in the target range determined by your health care provider. Things that help control blood glucose levels include:   Blood glucose monitoring.   Meal planning.   Physical activity.   Diabetes medicine.  Over time, maintaining lower blood glucose levels helps lessen symptoms. Sometimes, prescription pain medicine is needed. HOME CARE INSTRUCTIONS:  Do not smoke.  Keep your blood glucose level in the range that you and your health care provider have determined acceptable for you.  Keep your blood pressure level in the range that you and your health care provider have determined acceptable for you.  Eat a well-balanced diet.  Be active every day.  Check your feet every day. SEEK MEDICAL CARE IF:   You have burning, stabbing, or aching pain in the legs or feet.  You are unable to feel pressure or pain in your feet.  You develop problems with digestion such as:  Nausea.  Vomiting.  Bloating.  Constipation.  Diarrhea.  Abdominal pain.  You have difficulty with urination, such as:  Incontinence.  Retention.  You have palpitations.  You develop orthostatic hypotension. When you stand up you may feel:  Dizzy.  Weak.  Faint.  You cannot attain and maintain an erection (in men).  You have vaginal dryness and problems with decreased sexual desire and arousal (in women).  You have severe pain in your thighs, legs, or buttocks.  You have   unexplained weight loss. Document Released: 11/03/2001 Document Revised: 06/15/2013 Document Reviewed: 02/03/2013 [employer] Patient Information 2014 [employer].  

## 2013-11-08 ENCOUNTER — Telehealth: Payer: Self-pay | Admitting: Family Medicine

## 2013-11-08 LAB — HEPATIC FUNCTION PANEL
ALT: 20 U/L (ref 0–35)
AST: 14 U/L (ref 0–37)
Albumin: 4 g/dL (ref 3.5–5.2)
Alkaline Phosphatase: 78 U/L (ref 39–117)
Bilirubin, Direct: 0.1 mg/dL (ref 0.0–0.3)
Indirect Bilirubin: 0.4 mg/dL (ref 0.2–1.2)
Total Bilirubin: 0.5 mg/dL (ref 0.2–1.2)
Total Protein: 6.8 g/dL (ref 6.0–8.3)

## 2013-11-08 LAB — RENAL FUNCTION PANEL
Albumin: 4 g/dL (ref 3.5–5.2)
BUN: 14 mg/dL (ref 6–23)
CO2: 31 mEq/L (ref 19–32)
Calcium: 9.3 mg/dL (ref 8.4–10.5)
Chloride: 102 mEq/L (ref 96–112)
Creat: 0.54 mg/dL (ref 0.50–1.10)
Glucose, Bld: 247 mg/dL — ABNORMAL HIGH (ref 70–99)
Phosphorus: 3.6 mg/dL (ref 2.3–4.6)
Potassium: 4.3 mEq/L (ref 3.5–5.3)
Sodium: 139 mEq/L (ref 135–145)

## 2013-11-08 LAB — LIPID PANEL
Cholesterol: 173 mg/dL (ref 0–200)
HDL: 48 mg/dL (ref >39)
LDL Cholesterol: 95 mg/dL (ref 0–99)
Total CHOL/HDL Ratio: 3.6 Ratio
Triglycerides: 148 mg/dL (ref <150)
VLDL: 30 mg/dL (ref 0–40)

## 2013-11-08 LAB — TSH: TSH: 3.068 u[IU]/mL (ref 0.350–4.500)

## 2013-11-08 NOTE — Telephone Encounter (Signed)
Relevant patient education mailed to patient.  

## 2013-11-09 ENCOUNTER — Telehealth: Payer: Self-pay | Admitting: Family Medicine

## 2013-11-09 NOTE — Telephone Encounter (Signed)
PA required for lyrica, insurance is suggesting that she try Gabapentin first. Please advise

## 2013-11-09 NOTE — Telephone Encounter (Signed)
I will let insurance know and PA paperwork sent over.

## 2013-11-09 NOTE — Telephone Encounter (Signed)
She did try gabapentin already did not tolerate due to sedation

## 2013-11-10 NOTE — Telephone Encounter (Signed)
PA form received, form forward to nurse

## 2013-11-10 NOTE — Telephone Encounter (Signed)
Pa form faxed to [facility]

## 2013-11-12 ENCOUNTER — Encounter: Payer: Self-pay | Admitting: Family Medicine

## 2013-11-12 NOTE — Assessment & Plan Note (Signed)
Tolerating Pravastatin, minimize simple carbs and trans fats.

## 2013-11-12 NOTE — Assessment & Plan Note (Signed)
Worsening control, encouraged endocrine referral again. Declines, will need to titrate up as directed. Encouraged to add mealtime insulin. Avoid simple carbs.

## 2013-11-12 NOTE — Progress Notes (Signed)
[name], female   DOB: Aug 24, 1966, 48 y.o.   MRN: [id number] [name] [id number] 22-Jun-1966 11/12/2013      Progress Note-Follow Up  Subjective  Chief Complaint  Chief Complaint  Patient presents with  . Follow-up    HPI  Patient is a 48 year old Caucasian female who is in today for followup. She continues to work long hours but has tried to alter her diet. Has stopped drinking sodas and has decreased her carbohydrate intake. Unfortunately her blood sugars continue to run high. She reports they're better but she generally only checks them fasting. She notes numbers generally between 150 and 200. Denies polyuria or polydipsia. No recent chest pain, palpitations or shortness of breath. Does continue to struggle with fatigue and her greatest complaint today is of peripheral neuropathy she has numbness tingling and pain in her feet traveling all the way up her lower leg it stops about the knee bilateral. No skin breakdown fevers or other illness  Past Medical History  Diagnosis Date  . Hypertension   . Hyperlipidemia 12/06/2010  . Overweight 12/06/2010  . NEPHROLITHIASIS, HX OF 10/07/2010  . RESTLESS LEG SYNDROME 10/25/2010  . PERIPHERAL NEUROPATHY, FEET 10/07/2010  . HEART MURMUR, HX OF 10/07/2010  . Essential hypertension, benign 10/07/2010  . DM 10/07/2010  . Disturbance of skin sensation 10/07/2010  . COMMON MIGRAINE 10/07/2010  . Acute bronchitis 07/08/2011  . Diabetes mellitus type II, uncontrolled 10/07/2010    Qualifier: Diagnosis of  By: Charlett Blake MD, [name]    . PVD (peripheral vascular disease) 01/21/2012  . SOB (shortness of breath) 04/01/2012  . Depression 12/18/2012  . Weakness of right side of body 04/19/2013  . CVA (cerebral infarction) 04/19/2013    History reviewed. No pertinent past surgical history.  Family History  Problem Relation Age of Onset  . Arthritis Mother   . Stroke Brother     previous smoker  . Alcohol abuse Brother     in remission   . Leukemia Brother   . Diabetes Paternal Grandmother     History   Social History  . Marital Status: Married    Spouse Name: N/A    Number of Children: N/A  . Years of Education: N/A   Occupational History  . Not on file.   Social History Main Topics  . Smoking status: Former Smoker -- 0.50 packs/day    Quit date: 09/08/2005  . Smokeless tobacco: Never Used  . Alcohol Use: No     Comment: occasional  . Drug Use: No  . Sexual Activity: Yes    Partners: Male   Other Topics Concern  . Not on file   Social History Narrative  . No narrative on file    Current Outpatient Prescriptions on File Prior to Visit  Medication Sig Dispense Refill  . amitriptyline (ELAVIL) 10 MG tablet Take 10 mg by mouth at bedtime as needed.      Marland Kitchen aspirin EC 81 MG tablet Take 1 tablet (81 mg total) by mouth daily.  30 tablet    . chlorthalidone (HYGROTON) 25 MG tablet Take 1 tablet (25 mg total) by mouth daily.  30 tablet  3  . ciprofloxacin-dexamethasone (CIPRODEX) otic suspension Place 4 drops into the right ear 2 (two) times daily.  7.5 mL  0  . clopidogrel (PLAVIX) 75 MG tablet Take 75 mg by mouth daily.      . cyclobenzaprine (FLEXERIL) 5 MG tablet Take 1 tablet (5 mg total) by mouth at  bedtime as needed for muscle spasms.  30 tablet  0  . glucose blood (FREESTYLE LITE) test strip 1 each by Other route daily. DX- 250.02  Pts machine is Freestyle Freedom Lite  100 each  5  . ibuprofen (ADVIL,MOTRIN) 400 MG tablet Take 1 tablet (400 mg total) by mouth 2 (two) times daily as needed for pain (with food).  60 tablet  2  . insulin glargine (LANTUS) 100 UNIT/ML injection 26 units SQ qam, may increase by 2 units every 3 days if sugars remain above 100 consistently  5 pen  6  . Insulin Pen Needle 31G X 8 MM MISC USE AS DIRECTED TO INJECT INSULIN Dx: 250.02  50 each  5  . Krill Oil CAPS Take 1 capsule by mouth daily.  30 capsule    . Lancets (FREESTYLE) lancets DX- 250.02  Pts machine is Freestyle  Freedom Lite  100 each  5  . metFORMIN (GLUCOPHAGE) 1000 MG tablet 1 tab po bid and 1/2 tab po q noon  75 tablet  5  . metoprolol (TOPROL-XL) 25 MG 24 hr tablet Take 25 mg by mouth daily.        . pravastatin (PRAVACHOL) 10 MG tablet Take 1 tablet (10 mg total) by mouth daily.  30 tablet  3   No current facility-administered medications on file prior to visit.    No Known Allergies  Review of Systems  Review of Systems  Constitutional: Negative for fever and malaise/fatigue.  HENT: Negative for congestion.   Eyes: Negative for discharge.  Respiratory: Negative for shortness of breath.   Cardiovascular: Negative for chest pain, palpitations and leg swelling.  Gastrointestinal: Negative for nausea, abdominal pain and diarrhea.  Genitourinary: Negative for dysuria.  Musculoskeletal: Negative for falls.  Skin: Negative for rash.  Neurological: Positive for tingling and sensory change. Negative for loss of consciousness and headaches.       Numbness and buringing in feet and legs  Endo/Heme/Allergies: Negative for polydipsia.  Psychiatric/Behavioral: Negative for depression and suicidal ideas. The patient is not nervous/anxious and does not have insomnia.     Objective  BP 162/98  Pulse 92  Temp(Src) 98 F (36.7 C) (Oral)  Ht 5' 2.5" (1.588 m)  Wt 143 lb 0.6 oz (64.883 kg)  BMI 25.73 kg/m2  SpO2 97%  Physical Exam  Physical Exam  Constitutional: She is oriented to person, place, and time and well-developed, well-nourished, and in no distress. No distress.  HENT:  Head: Normocephalic and atraumatic.  Eyes: Conjunctivae are normal.  Neck: Neck supple. No thyromegaly present.  Cardiovascular: Normal rate, regular rhythm and normal heart sounds.   Pulmonary/Chest: Effort normal and breath sounds normal. She has no wheezes.  Abdominal: She exhibits no distension and no mass.  Musculoskeletal: She exhibits no edema.  Lymphadenopathy:    She has no cervical adenopathy.   Neurological: She is alert and oriented to person, place, and time.  Skin: Skin is warm and dry. No rash noted. She is not diaphoretic.  Psychiatric: Memory, affect and judgment normal.    Lab Results  Component Value Date   TSH 3.068 11/07/2013   Lab Results  Component Value Date   WBC 8.3 11/07/2013   HGB 14.0 11/07/2013   HCT 40.3 11/07/2013   MCV 85.7 11/07/2013   PLT 330 11/07/2013   Lab Results  Component Value Date   CREATININE 0.54 11/07/2013   BUN 14 11/07/2013   NA 139 11/07/2013   K 4.3 11/07/2013  CL 102 11/07/2013   CO2 31 11/07/2013   Lab Results  Component Value Date   ALT 20 11/07/2013   AST 14 11/07/2013   ALKPHOS 78 11/07/2013   BILITOT 0.5 11/07/2013   Lab Results  Component Value Date   CHOL 173 11/07/2013   Lab Results  Component Value Date   HDL 48 11/07/2013   Lab Results  Component Value Date   LDLCALC 95 11/07/2013   Lab Results  Component Value Date   TRIG 148 11/07/2013   Lab Results  Component Value Date   CHOLHDL 3.6 11/07/2013     Assessment & Plan  Diabetes mellitus type II, uncontrolled Worsening control, encouraged endocrine referral again. Declines, will need to titrate up as directed. Encouraged to add mealtime insulin. Avoid simple carbs.  ESSENTIAL HYPERTENSION, BENIGN Increase Lisinopril to 10 mg po bid. Minimize sodium  Unspecified hereditary and idiopathic peripheral neuropathy Failed Gabapentin, will try Lyrica. Needs to control glucose, can try topical creams as well  Hyperlipidemia Tolerating Pravastatin, minimize simple carbs and trans fats.

## 2013-11-12 NOTE — Assessment & Plan Note (Signed)
Increase Lisinopril to 10 mg po bid. Minimize sodium

## 2013-11-12 NOTE — Assessment & Plan Note (Signed)
Failed Gabapentin, will try Lyrica. Needs to control glucose, can try topical creams as well

## 2013-11-24 NOTE — Telephone Encounter (Signed)
PA for Lyrica approved 10-23-13 through 11-23-14

## 2013-12-02 ENCOUNTER — Encounter: Payer: Self-pay | Admitting: Family Medicine

## 2013-12-02 ENCOUNTER — Ambulatory Visit (INDEPENDENT_AMBULATORY_CARE_PROVIDER_SITE_OTHER): Payer: BC Managed Care – PPO | Admitting: Family Medicine

## 2013-12-02 VITALS — BP 140/92 | HR 88 | Temp 98.1°F | Ht 62.5 in | Wt 141.1 lb

## 2013-12-02 DIAGNOSIS — E1165 Type 2 diabetes mellitus with hyperglycemia: Secondary | ICD-10-CM

## 2013-12-02 DIAGNOSIS — E1149 Type 2 diabetes mellitus with other diabetic neurological complication: Secondary | ICD-10-CM

## 2013-12-02 DIAGNOSIS — I739 Peripheral vascular disease, unspecified: Secondary | ICD-10-CM

## 2013-12-02 DIAGNOSIS — I10 Essential (primary) hypertension: Secondary | ICD-10-CM

## 2013-12-02 DIAGNOSIS — IMO0002 Reserved for concepts with insufficient information to code with codable children: Secondary | ICD-10-CM

## 2013-12-02 DIAGNOSIS — G609 Hereditary and idiopathic neuropathy, unspecified: Secondary | ICD-10-CM

## 2013-12-02 DIAGNOSIS — IMO0001 Reserved for inherently not codable concepts without codable children: Secondary | ICD-10-CM

## 2013-12-02 MED ORDER — OXYCODONE-ACETAMINOPHEN 5-325 MG PO TABS: 1.0000 | ORAL_TABLET | Freq: Three times a day (TID) | ORAL | Status: DC | PRN

## 2013-12-02 MED ORDER — AMITRIPTYLINE HCL 50 MG PO TABS: ORAL_TABLET | ORAL | Status: DC

## 2013-12-02 MED ORDER — INSULIN GLARGINE 100 UNIT/ML SOLOSTAR PEN: PEN_INJECTOR | SUBCUTANEOUS | Status: DC

## 2013-12-02 NOTE — Patient Instructions (Signed)

## 2013-12-02 NOTE — Progress Notes (Signed)
Pre visit review using our clinic review tool, if applicable. No additional management support is needed unless otherwise documented below in the visit note. 

## 2013-12-04 ENCOUNTER — Encounter: Payer: Self-pay | Admitting: Family Medicine

## 2013-12-04 NOTE — Assessment & Plan Note (Signed)
Improved some on recheck.  no changes to meds. Encouraged heart healthy diet such as the DASH diet and exercise as tolerated.

## 2013-12-04 NOTE — Assessment & Plan Note (Signed)
Once again encouraged to accept a referral to endocrinology but declines. Has increased her Lantus to 40 as directed but did not continue beyond that. Is still noting sugars around 200 and none under 100, encouraged to continue increasing Lantus by 2 units every 3 days until numbers start dropping below 100

## 2013-12-04 NOTE — Progress Notes (Signed)
[name], female   DOB: 01/21/1966, 48 y.o.   MRN: [id number] LAYLANIE KRUCZEK [id number] 07/21/1966 12/04/2013      Progress Note-Follow Up  Subjective  Chief Complaint  Chief Complaint  Patient presents with  . feet hurting    HPI  Patient is a 48 year old female in today for routine medical care. Patient is in today with worsening peripheral neuropathy. She describes burning and tingling all over both legs. Has numbness at the same time. Denies any color changes in the skin. Denies any calf pain. Has been increasing her Lantus but only to a Max of 40. Sugars are hovering around 200 she says no matter what she eats. No numbers under 100. She continues to struggle with fatigue and malaise and works extensive hours often overnight. No cp/palp/gi or gu c/o.  Past Medical History  Diagnosis Date  . Hypertension   . Hyperlipidemia 12/06/2010  . Overweight 12/06/2010  . NEPHROLITHIASIS, HX OF 10/07/2010  . RESTLESS LEG SYNDROME 10/25/2010  . PERIPHERAL NEUROPATHY, FEET 10/07/2010  . HEART MURMUR, HX OF 10/07/2010  . Essential hypertension, benign 10/07/2010  . DM 10/07/2010  . Disturbance of skin sensation 10/07/2010  . COMMON MIGRAINE 10/07/2010  . Acute bronchitis 07/08/2011  . Diabetes mellitus type II, uncontrolled 10/07/2010    Qualifier: Diagnosis of  By: Charlett Blake MD, [name]    . PVD (peripheral vascular disease) 01/21/2012  . SOB (shortness of breath) 04/01/2012  . Depression 12/18/2012  . Weakness of right side of body 04/19/2013  . CVA (cerebral infarction) 04/19/2013    History reviewed. No pertinent past surgical history.  Family History  Problem Relation Age of Onset  . Arthritis Mother   . Stroke Brother     previous smoker  . Alcohol abuse Brother     in remission  . Leukemia Brother   . Diabetes Paternal Grandmother     History   Social History  . Marital Status: Married    Spouse Name: N/A    Number of Children: N/A  . Years of Education: N/A    Occupational History  . Not on file.   Social History Main Topics  . Smoking status: Former Smoker -- 0.50 packs/day    Quit date: 09/08/2005  . Smokeless tobacco: Never Used  . Alcohol Use: No     Comment: occasional  . Drug Use: No  . Sexual Activity: Yes    Partners: Male   Other Topics Concern  . Not on file   Social History Narrative  . No narrative on file    Current Outpatient Prescriptions on File Prior to Visit  Medication Sig Dispense Refill  . amitriptyline (ELAVIL) 10 MG tablet Take 10 mg by mouth at bedtime as needed.      Marland Kitchen aspirin EC 81 MG tablet Take 1 tablet (81 mg total) by mouth daily.  30 tablet    . chlorthalidone (HYGROTON) 25 MG tablet Take 1 tablet (25 mg total) by mouth daily.  30 tablet  3  . ciprofloxacin-dexamethasone (CIPRODEX) otic suspension Place 4 drops into the right ear 2 (two) times daily.  7.5 mL  0  . clopidogrel (PLAVIX) 75 MG tablet Take 75 mg by mouth daily.      . cyclobenzaprine (FLEXERIL) 5 MG tablet Take 1 tablet (5 mg total) by mouth at bedtime as needed for muscle spasms.  30 tablet  0  . Efinaconazole (JUBLIA) 10 % SOLN Apply 1 application topically daily.  4  mL  5  . glucose blood (FREESTYLE LITE) test strip 1 each by Other route daily. DX- 250.02  Pts machine is Freestyle Freedom Lite  100 each  5  . ibuprofen (ADVIL,MOTRIN) 400 MG tablet Take 1 tablet (400 mg total) by mouth 2 (two) times daily as needed for pain (with food).  60 tablet  2  . Insulin Pen Needle 31G X 8 MM MISC USE AS DIRECTED TO INJECT INSULIN Dx: 250.02  50 each  5  . Krill Oil CAPS Take 1 capsule by mouth daily.  30 capsule    . Lancets (FREESTYLE) lancets DX- 250.02  Pts machine is Freestyle Freedom Lite  100 each  5  . lisinopril (PRINIVIL,ZESTRIL) 10 MG tablet Take 1 tablet (10 mg total) by mouth 2 (two) times daily.  60 tablet  5  . metFORMIN (GLUCOPHAGE) 1000 MG tablet 1 tab po bid and 1/2 tab po q noon  75 tablet  5  . metoprolol (TOPROL-XL) 25 MG  24 hr tablet Take 25 mg by mouth daily.        . pravastatin (PRAVACHOL) 10 MG tablet Take 1 tablet (10 mg total) by mouth daily.  30 tablet  3  . pregabalin (LYRICA) 50 MG capsule 1 tab po tid x 1 week then increase to 2 tabs po tid  180 capsule  2   No current facility-administered medications on file prior to visit.    No Known Allergies  Review of Systems  Review of Systems  Constitutional: Positive for malaise/fatigue. Negative for fever.  HENT: Negative for congestion.   Eyes: Negative for discharge.  Respiratory: Negative for shortness of breath.   Cardiovascular: Negative for chest pain, palpitations and leg swelling.  Gastrointestinal: Negative for nausea, abdominal pain and diarrhea.  Genitourinary: Negative for dysuria.  Musculoskeletal: Negative for falls.  Skin: Negative for rash.  Neurological: Positive for tingling and sensory change. Negative for loss of consciousness and headaches.  Endo/Heme/Allergies: Negative for polydipsia.  Psychiatric/Behavioral: Negative for depression and suicidal ideas. The patient is not nervous/anxious and does not have insomnia.     Objective  BP 140/92  Pulse 88  Temp(Src) 98.1 F (36.7 C) (Oral)  Ht 5' 2.5" (1.588 m)  Wt 141 lb 1.3 oz (63.993 kg)  BMI 25.38 kg/m2  SpO2 98%  Physical Exam  Physical Exam  Constitutional: She is oriented to person, place, and time and well-developed, well-nourished, and in no distress. No distress.  HENT:  Head: Normocephalic and atraumatic.  Eyes: Conjunctivae are normal.  Neck: Neck supple. No thyromegaly present.  Cardiovascular: Normal rate, regular rhythm and normal heart sounds.   No murmur heard. Pulmonary/Chest: Effort normal and breath sounds normal. She has no wheezes.  Abdominal: She exhibits no distension and no mass.  Musculoskeletal: She exhibits no edema.  Lymphadenopathy:    She has no cervical adenopathy.  Neurological: She is alert and oriented to person, place, and  time.  Skin: Skin is warm and dry. No rash noted. She is not diaphoretic.  Psychiatric: Memory, affect and judgment normal.    Lab Results  Component Value Date   TSH 3.068 11/07/2013   Lab Results  Component Value Date   WBC 8.3 11/07/2013   HGB 14.0 11/07/2013   HCT 40.3 11/07/2013   MCV 85.7 11/07/2013   PLT 330 11/07/2013   Lab Results  Component Value Date   CREATININE 0.54 11/07/2013   BUN 14 11/07/2013   NA 139 11/07/2013   K 4.3  11/07/2013   CL 102 11/07/2013   CO2 31 11/07/2013   Lab Results  Component Value Date   ALT 20 11/07/2013   AST 14 11/07/2013   ALKPHOS 78 11/07/2013   BILITOT 0.5 11/07/2013   Lab Results  Component Value Date   CHOL 173 11/07/2013   Lab Results  Component Value Date   HDL 48 11/07/2013   Lab Results  Component Value Date   LDLCALC 95 11/07/2013   Lab Results  Component Value Date   TRIG 148 11/07/2013   Lab Results  Component Value Date   CHOLHDL 3.6 11/07/2013     Assessment & Plan  Unspecified hereditary and idiopathic peripheral neuropathy Insurance will not pay for Lyrica. Will try Amitriptyline increase to 20 mg daily then to 30 mg then 50 mg and ultimately to 100 mg as needed and as tolerated.  Diabetes mellitus type II, uncontrolled Once again encouraged to accept a referral to endocrinology but declines. Has increased her Lantus to 40 as directed but did not continue beyond that. Is still noting sugars around 200 and none under 100, encouraged to continue increasing Lantus by 2 units every 3 days until numbers start dropping below 100  ESSENTIAL HYPERTENSION, BENIGN Improved some on recheck.  no changes to meds. Encouraged heart healthy diet such as the DASH diet and exercise as tolerated.

## 2013-12-04 NOTE — Assessment & Plan Note (Signed)
Insurance will not pay for Lyrica. Will try Amitriptyline increase to 20 mg daily then to 30 mg then 50 mg and ultimately to 100 mg as needed and as tolerated.

## 2013-12-05 ENCOUNTER — Other Ambulatory Visit: Payer: Self-pay | Admitting: *Deleted

## 2013-12-05 DIAGNOSIS — M79609 Pain in unspecified limb: Secondary | ICD-10-CM

## 2013-12-20 ENCOUNTER — Telehealth: Payer: Self-pay | Admitting: Family Medicine

## 2013-12-20 NOTE — Telephone Encounter (Signed)
Patient called in wanting to know if her paperwork has been completed?

## 2013-12-21 NOTE — Telephone Encounter (Signed)
Patient called back regarding this. She states that this paperwork is time sensitive and needs to be completed as soon as possible

## 2013-12-22 NOTE — Telephone Encounter (Signed)
Left a detailed message stating these were ready to be picked up

## 2013-12-22 NOTE — Telephone Encounter (Signed)
Waiting on md to come in to see if this is completed. MD was taking this home with her on Tues.

## 2013-12-24 ENCOUNTER — Emergency Department (HOSPITAL_BASED_OUTPATIENT_CLINIC_OR_DEPARTMENT_OTHER): Payer: BC Managed Care – PPO

## 2013-12-24 ENCOUNTER — Encounter (HOSPITAL_BASED_OUTPATIENT_CLINIC_OR_DEPARTMENT_OTHER): Payer: Self-pay | Admitting: Emergency Medicine

## 2013-12-24 ENCOUNTER — Emergency Department (HOSPITAL_BASED_OUTPATIENT_CLINIC_OR_DEPARTMENT_OTHER)
Admission: EM | Admit: 2013-12-24 | Discharge: 2013-12-24 | Disposition: A | Discharge status: Home / Self Care | Payer: BC Managed Care – PPO | Attending: Emergency Medicine | Admitting: Emergency Medicine

## 2013-12-24 DIAGNOSIS — Z7902 Long term (current) use of antithrombotics/antiplatelets: Secondary | ICD-10-CM | POA: Insufficient documentation

## 2013-12-24 DIAGNOSIS — E663 Overweight: Secondary | ICD-10-CM | POA: Insufficient documentation

## 2013-12-24 DIAGNOSIS — Z87891 Personal history of nicotine dependence: Secondary | ICD-10-CM | POA: Insufficient documentation

## 2013-12-24 DIAGNOSIS — E785 Hyperlipidemia, unspecified: Secondary | ICD-10-CM | POA: Insufficient documentation

## 2013-12-24 DIAGNOSIS — M549 Dorsalgia, unspecified: Secondary | ICD-10-CM

## 2013-12-24 DIAGNOSIS — IMO0001 Reserved for inherently not codable concepts without codable children: Secondary | ICD-10-CM | POA: Insufficient documentation

## 2013-12-24 DIAGNOSIS — R739 Hyperglycemia, unspecified: Secondary | ICD-10-CM

## 2013-12-24 DIAGNOSIS — Z79899 Other long term (current) drug therapy: Secondary | ICD-10-CM | POA: Insufficient documentation

## 2013-12-24 DIAGNOSIS — G609 Hereditary and idiopathic neuropathy, unspecified: Secondary | ICD-10-CM | POA: Insufficient documentation

## 2013-12-24 DIAGNOSIS — R011 Cardiac murmur, unspecified: Secondary | ICD-10-CM | POA: Insufficient documentation

## 2013-12-24 DIAGNOSIS — Z8673 Personal history of transient ischemic attack (TIA), and cerebral infarction without residual deficits: Secondary | ICD-10-CM | POA: Insufficient documentation

## 2013-12-24 DIAGNOSIS — Z792 Long term (current) use of antibiotics: Secondary | ICD-10-CM | POA: Insufficient documentation

## 2013-12-24 DIAGNOSIS — M25559 Pain in unspecified hip: Secondary | ICD-10-CM | POA: Insufficient documentation

## 2013-12-24 DIAGNOSIS — G43909 Migraine, unspecified, not intractable, without status migrainosus: Secondary | ICD-10-CM | POA: Insufficient documentation

## 2013-12-24 DIAGNOSIS — M545 Low back pain, unspecified: Secondary | ICD-10-CM | POA: Insufficient documentation

## 2013-12-24 DIAGNOSIS — Z8709 Personal history of other diseases of the respiratory system: Secondary | ICD-10-CM | POA: Insufficient documentation

## 2013-12-24 DIAGNOSIS — Z7982 Long term (current) use of aspirin: Secondary | ICD-10-CM | POA: Insufficient documentation

## 2013-12-24 DIAGNOSIS — Z87442 Personal history of urinary calculi: Secondary | ICD-10-CM | POA: Insufficient documentation

## 2013-12-24 DIAGNOSIS — E1165 Type 2 diabetes mellitus with hyperglycemia: Secondary | ICD-10-CM

## 2013-12-24 DIAGNOSIS — Z794 Long term (current) use of insulin: Secondary | ICD-10-CM | POA: Insufficient documentation

## 2013-12-24 DIAGNOSIS — I10 Essential (primary) hypertension: Secondary | ICD-10-CM

## 2013-12-24 DIAGNOSIS — G2581 Restless legs syndrome: Secondary | ICD-10-CM | POA: Insufficient documentation

## 2013-12-24 LAB — BASIC METABOLIC PANEL
BUN: 14 mg/dL (ref 6–23)
CO2: 29 mEq/L (ref 19–32)
Calcium: 9.7 mg/dL (ref 8.4–10.5)
Chloride: 99 mEq/L (ref 96–112)
Creatinine, Ser: 0.5 mg/dL (ref 0.50–1.10)
GFR calc Af Amer: >90 mL/min (ref >90)
GFR calc non Af Amer: >90 mL/min (ref >90)
Glucose, Bld: 360 mg/dL — ABNORMAL HIGH (ref 70–99)
Potassium: 4.4 mEq/L (ref 3.7–5.3)
Sodium: 139 mEq/L (ref 137–147)

## 2013-12-24 LAB — URINALYSIS, ROUTINE W REFLEX MICROSCOPIC
Bilirubin Urine: NEGATIVE
Glucose, UA: >1000 mg/dL — AB
Hgb urine dipstick: NEGATIVE
Ketones, ur: NEGATIVE mg/dL
Leukocytes, UA: NEGATIVE
Nitrite: NEGATIVE
Protein, ur: NEGATIVE mg/dL
Specific Gravity, Urine: 1.026 (ref 1.005–1.030)
Urobilinogen, UA: 0.2 mg/dL (ref 0.0–1.0)
pH: 7.5 (ref 5.0–8.0)

## 2013-12-24 LAB — URINE MICROSCOPIC-ADD ON

## 2013-12-24 LAB — CBC WITH DIFFERENTIAL/PLATELET
Basophils Absolute: 0.1 10*3/uL (ref 0.0–0.1)
Basophils Relative: 1 % (ref 0–1)
Eosinophils Absolute: 0.3 10*3/uL (ref 0.0–0.7)
Eosinophils Relative: 4 % (ref 0–5)
HCT: 41.4 % (ref 36.0–46.0)
Hemoglobin: 14.4 g/dL (ref 12.0–15.0)
Lymphocytes Relative: 26 % (ref 12–46)
Lymphs Abs: 1.7 10*3/uL (ref 0.7–4.0)
MCH: 30 pg (ref 26.0–34.0)
MCHC: 34.8 g/dL (ref 30.0–36.0)
MCV: 86.3 fL (ref 78.0–100.0)
Monocytes Absolute: 0.6 10*3/uL (ref 0.1–1.0)
Monocytes Relative: 9 % (ref 3–12)
Neutro Abs: 3.9 10*3/uL (ref 1.7–7.7)
Neutrophils Relative %: 60 % (ref 43–77)
Platelets: 328 10*3/uL (ref 150–400)
RBC: 4.8 MIL/uL (ref 3.87–5.11)
RDW: 12.3 % (ref 11.5–15.5)
WBC: 6.6 10*3/uL (ref 4.0–10.5)

## 2013-12-24 MED ORDER — ONDANSETRON HCL 4 MG/2ML IJ SOLN
4.0000 mg | Freq: Once | INTRAMUSCULAR | Status: AC
  Administered 2013-12-24: 4 mg via INTRAVENOUS
  Filled 2013-12-24: qty 2

## 2013-12-24 MED ORDER — ONDANSETRON 8 MG PO TBDP: 8.0000 mg | ORAL_TABLET | Freq: Three times a day (TID) | ORAL | Status: DC | PRN

## 2013-12-24 MED ORDER — ONDANSETRON HCL 4 MG/2ML IJ SOLN
INTRAMUSCULAR | Status: AC
  Administered 2013-12-24: 4 mg via INTRAVENOUS
  Filled 2013-12-24: qty 2

## 2013-12-24 MED ORDER — HYDROCODONE-ACETAMINOPHEN 5-325 MG PO TABS: 1.0000 | ORAL_TABLET | ORAL | Status: DC | PRN

## 2013-12-24 MED ORDER — ONDANSETRON HCL 4 MG/2ML IJ SOLN: 4.0000 mg | Freq: Once | INTRAMUSCULAR | Status: DC

## 2013-12-24 MED ORDER — ONDANSETRON HCL 4 MG/2ML IJ SOLN
4.0000 mg | Freq: Once | INTRAMUSCULAR | Status: AC
  Administered 2013-12-24: 4 mg via INTRAVENOUS

## 2013-12-24 MED ORDER — HYDROMORPHONE HCL PF 1 MG/ML IJ SOLN
1.0000 mg | INTRAMUSCULAR | Status: DC | PRN
  Administered 2013-12-24: 1 mg via INTRAVENOUS
  Filled 2013-12-24: qty 1

## 2013-12-24 NOTE — ED Notes (Signed)
Pt c/o nausea.  

## 2013-12-24 NOTE — ED Notes (Signed)
Pt having lower back pain since Tuesday which has radiated to left hip.  Some weakness to left leg.  No elimination issues.

## 2013-12-24 NOTE — ED Provider Notes (Signed)
CSN: [id number]     Arrival date & time 12/24/13  1013 History   First MD Initiated Contact with Patient 12/24/13 1032     Chief Complaint  Patient presents with  . Back Pain  . Hip Pain    Patient is a 48 y.o. female presenting with back pain and hip pain. The history is provided by the patient.  Back Pain Location:  Lumbar spine Quality:  Aching Radiates to: left hip and left groin. Pain severity:  Moderate Onset quality:  Gradual Duration:  5 days Timing:  Constant Progression:  Worsening Chronicity:  New Context: not lifting heavy objects, not occupational injury and not recent injury   Relieved by:  Nothing (she was trying oxycodone) Exacerbated by: not worse with bending or movement. Associated symptoms: no abdominal pain, no dysuria, no fever, no numbness and no weakness   Hip Pain Pertinent negatives include no abdominal pain.    Past Medical History  Diagnosis Date  . Hypertension   . Hyperlipidemia 12/06/2010  . Overweight 12/06/2010  . NEPHROLITHIASIS, HX OF 10/07/2010  . RESTLESS LEG SYNDROME 10/25/2010  . PERIPHERAL NEUROPATHY, FEET 10/07/2010  . HEART MURMUR, HX OF 10/07/2010  . Essential hypertension, benign 10/07/2010  . DM 10/07/2010  . Disturbance of skin sensation 10/07/2010  . COMMON MIGRAINE 10/07/2010  . Acute bronchitis 07/08/2011  . Diabetes mellitus type II, uncontrolled 10/07/2010    Qualifier: Diagnosis of  By: Charlett Blake MD, [name]    . PVD (peripheral vascular disease) 01/21/2012  . SOB (shortness of breath) 04/01/2012  . Depression 12/18/2012  . Weakness of right side of body 04/19/2013  . CVA (cerebral infarction) 04/19/2013   No past surgical history on file. Family History  Problem Relation Age of Onset  . Arthritis Mother   . Stroke Brother     previous smoker  . Alcohol abuse Brother     in remission  . Leukemia Brother   . Diabetes Paternal Grandmother    History  Substance Use Topics  . Smoking status: Former Smoker -- 0.50 packs/day     Quit date: 09/08/2005  . Smokeless tobacco: Never Used  . Alcohol Use: No     Comment: occasional   OB History   Grav Para Term Preterm Abortions TAB SAB Ect Mult Living                 Review of Systems  Constitutional: Negative for fever.  Gastrointestinal: Negative for abdominal pain and diarrhea.  Genitourinary: Negative for dysuria.  Musculoskeletal: Positive for back pain.  Neurological: Negative for weakness and numbness.  All other systems reviewed and are negative.     Allergies  Review of patient's allergies indicates no known allergies.  Home Medications   Prior to Admission medications   Medication Sig Start Date End Date Taking? Authorizing Provider  amitriptyline (ELAVIL) 10 MG tablet Take 10 mg by mouth at bedtime as needed. 05/03/13   Historical Provider, MD  amitriptyline (ELAVIL) 50 MG tablet 1 tab po qhs x 7 days then 2 tabs po qhs 12/02/13   Mosie Lukes, MD  aspirin EC 81 MG tablet Take 1 tablet (81 mg total) by mouth daily. 12/17/12   Mosie Lukes, MD  chlorthalidone (HYGROTON) 25 MG tablet Take 1 tablet (25 mg total) by mouth daily. 02/19/12 12/02/13  Mosie Lukes, MD  ciprofloxacin-dexamethasone (CIPRODEX) otic suspension Place 4 drops into the right ear 2 (two) times daily. 09/09/13   Leeanne Rio, PA-C  clopidogrel (  PLAVIX) 75 MG tablet Take 75 mg by mouth daily. 05/16/13   Historical Provider, MD  cyclobenzaprine (FLEXERIL) 5 MG tablet Take 1 tablet (5 mg total) by mouth at bedtime as needed for muscle spasms. 03/08/13   Debbrah Alar, NP  Efinaconazole (JUBLIA) 10 % SOLN Apply 1 application topically daily. 11/07/13   Mosie Lukes, MD  glucose blood (FREESTYLE LITE) test strip 1 each by Other route daily. DX- 250.02  Pts machine is Freestyle Freedom Lite 04/20/13   Mosie Lukes, MD  ibuprofen (ADVIL,MOTRIN) 400 MG tablet Take 1 tablet (400 mg total) by mouth 2 (two) times daily as needed for pain (with food). 03/25/13   Mosie Lukes, MD   Insulin Glargine (LANTUS) 100 UNIT/ML Solostar Pen 42 units SQ daily and increase by 2 unites every 3 days as long as numbers remain above 100 12/02/13   Mosie Lukes, MD  Insulin Pen Needle 31G X 8 MM MISC USE AS DIRECTED TO INJECT INSULIN Dx: 250.02 04/26/13   Mosie Lukes, MD  Astrid Drafts CAPS Take 1 capsule by mouth daily. 11/25/11   Mosie Lukes, MD  Lancets (FREESTYLE) lancets DX- 250.02  Pts machine is Freestyle Freedom Lite 04/20/13   Mosie Lukes, MD  lisinopril (PRINIVIL,ZESTRIL) 10 MG tablet Take 1 tablet (10 mg total) by mouth 2 (two) times daily. 11/07/13   Mosie Lukes, MD  metFORMIN (GLUCOPHAGE) 1000 MG tablet 1 tab po bid and 1/2 tab po q noon 01/12/12   Mosie Lukes, MD  metoprolol (TOPROL-XL) 25 MG 24 hr tablet Take 25 mg by mouth daily.      Historical Provider, MD  oxyCODONE-acetaminophen (ROXICET) 5-325 MG per tablet Take 1-2 tablets by mouth every 8 (eight) hours as needed for moderate pain or severe pain. 12/02/13   Mosie Lukes, MD  pravastatin (PRAVACHOL) 10 MG tablet Take 1 tablet (10 mg total) by mouth daily. 12/17/12   Mosie Lukes, MD  pregabalin (LYRICA) 50 MG capsule 1 tab po tid x 1 week then increase to 2 tabs po tid 11/07/13   Mosie Lukes, MD   BP 196/105  Pulse 96  Temp(Src) 98.2 F (36.8 C) (Oral)  Resp 20  SpO2 98% Physical Exam  Nursing note and vitals reviewed. Constitutional: She appears well-developed and well-nourished. No distress.  HENT:  Head: Normocephalic and atraumatic.  Right Ear: External ear normal.  Left Ear: External ear normal.  Nose: Nose normal.  Eyes: Conjunctivae and EOM are normal. Right eye exhibits no discharge. Left eye exhibits no discharge. No scleral icterus.  Neck: Neck supple. No tracheal deviation present.  Cardiovascular: Normal rate, regular rhythm and intact distal pulses.   Pulmonary/Chest: Effort normal and breath sounds normal. No stridor. No respiratory distress. She has no wheezes. She has no rales.   Abdominal: Soft. Bowel sounds are normal. She exhibits no distension. There is no tenderness. There is no rebound and no guarding.  No hernia, no mass in the groin, no lymphadenopathy   Musculoskeletal: She exhibits no edema and no tenderness.       Lumbar back: She exhibits decreased range of motion, pain and spasm. She exhibits no swelling and no edema.  Neurological: She is alert. She has normal strength. She is not disoriented. No cranial nerve deficit (no facial droop, extraocular movements intact, no slurred speech) or sensory deficit. She exhibits normal muscle tone. She displays no seizure activity. Coordination normal.  Reflex Scores:  Patellar reflexes are 2+ on the right side and 2+ on the left side.      Achilles reflexes are 2+ on the right side and 2+ on the left side. Skin: Skin is warm and dry. No rash noted. She is not diaphoretic. No erythema.  Psychiatric: She has a normal mood and affect. Her behavior is normal. Thought content normal.    ED Course  Procedures (including critical care time) Labs Review Labs Reviewed  BASIC METABOLIC PANEL - Abnormal; Notable for the following:    Glucose, Bld 360 (*)    All other components within normal limits  URINALYSIS, ROUTINE W REFLEX MICROSCOPIC - Abnormal; Notable for the following:    Glucose, UA >1000 (*)    All other components within normal limits  CBC WITH DIFFERENTIAL  URINE MICROSCOPIC-ADD ON    Imaging Review Dg Lumbar Spine Complete  12/24/2013   CLINICAL DATA:  Low back pain.  EXAM: LUMBAR SPINE - COMPLETE 4+ VIEW  COMPARISON:  04/01/2013.  FINDINGS: There is no evidence of lumbar spine fracture. Alignment is normal. Intervertebral disc spaces are maintained. No significant change from prior.  IMPRESSION: Negative.   Electronically Signed   By: Rolla Flatten M.D.   On: 12/24/2013 11:34   Medications  HYDROmorphone (DILAUDID) injection 1 mg (1 mg Intravenous Given 12/24/13 1106)  ondansetron (ZOFRAN) injection  4 mg (4 mg Intravenous Given 12/24/13 1205)     MDM   Final diagnoses:  Back pain  HTN (hypertension)  Hyperglycemia    No sign of acute neurological or vascular emergency associated with pt's back pain.  May have a component of sciatica.  Doubt acute vascular etiology. Doubt renal etiology.  The patient did not take her blood pressure medications for diabetes medications this morning. I discussed her elevated blood pressure and elevated blood sugar with her. She will take her medications when she gets home and followup with her doctor   Safe for outpatient follow up.    Kathalene Frames, MD 12/24/13 [phone]

## 2013-12-24 NOTE — Discharge Instructions (Signed)
Back Pain, Adult Low back pain is very common. About 1 in 5 people have back pain.The cause of low back pain is rarely dangerous. The pain often gets better over time.About half of people with a sudden onset of back pain feel better in just 2 weeks. About 8 in 10 people feel better by 6 weeks.  CAUSES Some common causes of back pain include:  Strain of the muscles or ligaments supporting the spine.  Wear and tear (degeneration) of the spinal discs.  Arthritis.  Direct injury to the back. DIAGNOSIS Most of the time, the direct cause of low back pain is not known.However, back pain can be treated effectively even when the exact cause of the pain is unknown.Answering your caregiver's questions about your overall health and symptoms is one of the most accurate ways to make sure the cause of your pain is not dangerous. If your caregiver needs more information, he or she may order lab work or imaging tests (X-rays or MRIs).However, even if imaging tests show changes in your back, this usually does not require surgery. HOME CARE INSTRUCTIONS For many people, back pain returns.Since low back pain is rarely dangerous, it is often a condition that people can learn to manageon their own.   Remain active. It is stressful on the back to sit or stand in one place. Do not sit, drive, or stand in one place for more than 30 minutes at a time. Take short walks on level surfaces as soon as pain allows.Try to increase the length of time you walk each day.  Do not stay in bed.Resting more than 1 or 2 days can delay your recovery.  Do not avoid exercise or work.Your body is made to move.It is not dangerous to be active, even though your back may hurt.Your back will likely heal faster if you return to being active before your pain is gone.  Pay attention to your body when you bend and lift. Many people have less discomfortwhen lifting if they bend their knees, keep the load close to their bodies,and  avoid twisting. Often, the most comfortable positions are those that put less stress on your recovering back.  Find a comfortable position to sleep. Use a firm mattress and lie on your side with your knees slightly bent. If you lie on your back, put a pillow under your knees.  Only take over-the-counter or prescription medicines as directed by your caregiver. Over-the-counter medicines to reduce pain and inflammation are often the most helpful.Your caregiver may prescribe muscle relaxant drugs.These medicines help dull your pain so you can more quickly return to your normal activities and healthy exercise.  Put ice on the injured area.  Put ice in a plastic bag.  Place a towel between your skin and the bag.  Leave the ice on for 15-20 minutes, 03-04 times a day for the first 2 to 3 days. After that, ice and heat may be alternated to reduce pain and spasms.  Ask your caregiver about trying back exercises and gentle massage. This may be of some benefit.  Avoid feeling anxious or stressed.Stress increases muscle tension and can worsen back pain.It is important to recognize when you are anxious or stressed and learn ways to manage it.Exercise is a great option. SEEK MEDICAL CARE IF:  You have pain that is not relieved with rest or medicine.  You have pain that does not improve in 1 week.  You have new symptoms.  You are generally not feeling well. SEEK   IMMEDIATE MEDICAL CARE IF:   You have pain that radiates from your back into your legs.  You develop new bowel or bladder control problems.  You have unusual weakness or numbness in your arms or legs.  You develop nausea or vomiting.  You develop abdominal pain.  You feel faint. Document Released: 08/25/2005 Document Revised: 02/24/2012 Document Reviewed: 01/13/2011 [employer] Patient Information 2014 [employer].  

## 2013-12-29 ENCOUNTER — Telehealth: Payer: Self-pay

## 2013-12-29 NOTE — Telephone Encounter (Signed)
Relevant patient education mailed to patient.  

## 2014-01-02 ENCOUNTER — Telehealth: Payer: Self-pay

## 2014-01-02 NOTE — Telephone Encounter (Signed)
Patient dropped off FMLA paperwork to be filled out. Put on md's desk

## 2014-01-06 ENCOUNTER — Ambulatory Visit (HOSPITAL_BASED_OUTPATIENT_CLINIC_OR_DEPARTMENT_OTHER)
Admission: RE | Admit: 2014-01-06 | Discharge: 2014-01-06 | Disposition: A | Discharge status: Home / Self Care | Payer: BC Managed Care – PPO | Source: Ambulatory Visit | Attending: Physician Assistant | Admitting: Physician Assistant

## 2014-01-06 ENCOUNTER — Telehealth: Payer: Self-pay | Admitting: Physician Assistant

## 2014-01-06 ENCOUNTER — Encounter: Payer: Self-pay | Admitting: Physician Assistant

## 2014-01-06 ENCOUNTER — Ambulatory Visit (INDEPENDENT_AMBULATORY_CARE_PROVIDER_SITE_OTHER): Payer: BC Managed Care – PPO | Admitting: Physician Assistant

## 2014-01-06 VITALS — BP 152/96 | HR 90 | Temp 98.0°F | Resp 16 | Ht 62.5 in | Wt 140.2 lb

## 2014-01-06 DIAGNOSIS — M899 Disorder of bone, unspecified: Secondary | ICD-10-CM | POA: Insufficient documentation

## 2014-01-06 DIAGNOSIS — G609 Hereditary and idiopathic neuropathy, unspecified: Secondary | ICD-10-CM

## 2014-01-06 DIAGNOSIS — M949 Disorder of cartilage, unspecified: Secondary | ICD-10-CM

## 2014-01-06 DIAGNOSIS — IMO0002 Reserved for concepts with insufficient information to code with codable children: Secondary | ICD-10-CM

## 2014-01-06 DIAGNOSIS — M25559 Pain in unspecified hip: Secondary | ICD-10-CM | POA: Insufficient documentation

## 2014-01-06 DIAGNOSIS — E1165 Type 2 diabetes mellitus with hyperglycemia: Secondary | ICD-10-CM

## 2014-01-06 DIAGNOSIS — I10 Essential (primary) hypertension: Secondary | ICD-10-CM

## 2014-01-06 DIAGNOSIS — R109 Unspecified abdominal pain: Secondary | ICD-10-CM | POA: Insufficient documentation

## 2014-01-06 DIAGNOSIS — M898X5 Other specified disorders of bone, thigh: Secondary | ICD-10-CM

## 2014-01-06 DIAGNOSIS — M549 Dorsalgia, unspecified: Secondary | ICD-10-CM

## 2014-01-06 DIAGNOSIS — IMO0001 Reserved for inherently not codable concepts without codable children: Secondary | ICD-10-CM

## 2014-01-06 LAB — GLUCOSE, POCT (MANUAL RESULT ENTRY): POC Glucose: 290 mg/dl — AB (ref 70–99)

## 2014-01-06 MED ORDER — OXYCODONE-ACETAMINOPHEN 5-325 MG PO TABS: 1.0000 | ORAL_TABLET | Freq: Three times a day (TID) | ORAL | Status: DC | PRN

## 2014-01-06 MED ORDER — CYCLOBENZAPRINE HCL 5 MG PO TABS: 5.0000 mg | ORAL_TABLET | Freq: Every evening | ORAL | Status: DC | PRN

## 2014-01-06 NOTE — Patient Instructions (Addendum)
Please take medications as directed.  Do not skip doses of insulin or blood pressure medications.  Take those medications as soon as you get home.  Continue Percocet for pain.  Flexeril will help with muscle tension and spasms.  Apply topical Icy Hot and Heat to the area.  Activity as tolerated.   I will call you with your imaging results. Follow-up 3 weeks

## 2014-01-06 NOTE — Telephone Encounter (Signed)
Discussed results with patient.  Will proceed with NM whole body bone scan.

## 2014-01-12 ENCOUNTER — Encounter: Payer: Self-pay | Admitting: Vascular Surgery

## 2014-01-12 DIAGNOSIS — M25559 Pain in unspecified hip: Secondary | ICD-10-CM | POA: Insufficient documentation

## 2014-01-12 NOTE — Assessment & Plan Note (Signed)
Medications refill. Conservative measures discussed. Continue Lyrica. Will set up with specialist for further evaluation.

## 2014-01-12 NOTE — Assessment & Plan Note (Signed)
Discussed with patient importance of being compliant with her medication regimen. Take medication as directed. Check glucose at home. Record findings. Followup with primary care provider in 3 weeks.

## 2014-01-12 NOTE — Assessment & Plan Note (Signed)
Given the severe groin/hip pain and patient's history of smoking, will obtain x-ray of her left hip. Pain medication refill. Conservative measures discussed. Patient then referred to orthopedics for her back pain.

## 2014-01-12 NOTE — Assessment & Plan Note (Signed)
BP elevated. Asymptomatic. Patient has not taken her medication. Instructed patient to take medication as directed. DASH handout given. Patient is aware of the risk associated with uncontrolled blood pressure and blood sugar levels.

## 2014-01-12 NOTE — Progress Notes (Signed)
Patient presents to clinic today for ER followup. Patient was seen in the emergency room on 12/24/2013 for low back pain with radicular symptoms. Initial workup unremarkable. Patient given pain medication and instruction to followup with her primary care provider. Of note patient was found to be hypertensive and hyperglycemic. Patient has history of noncompliance with her medications. Patient has significant past medical history including severe neuropathy. Patient endorses continued low back pain radiating into her left hip and thigh. Endorses severe pain in her left groin that she states is very achy.   Of note BP elevated in clinic today. Patient states she has not taken her medication today. Denies chest pain, palpitations, shortness of breath, lightheadedness, dizziness or headache. Patient's random blood glucose is 290.  She states she has not taken her insulin this morning.   Past Medical History  Diagnosis Date  . Hypertension   . Hyperlipidemia 12/06/2010  . Overweight 12/06/2010  . NEPHROLITHIASIS, HX OF 10/07/2010  . RESTLESS LEG SYNDROME 10/25/2010  . PERIPHERAL NEUROPATHY, FEET 10/07/2010  . HEART MURMUR, HX OF 10/07/2010  . Essential hypertension, benign 10/07/2010  . DM 10/07/2010  . Disturbance of skin sensation 10/07/2010  . COMMON MIGRAINE 10/07/2010  . Acute bronchitis 07/08/2011  . Diabetes mellitus type II, uncontrolled 10/07/2010    Qualifier: Diagnosis of  By: Charlett Blake MD, [name]    . PVD (peripheral vascular disease) 01/21/2012  . SOB (shortness of breath) 04/01/2012  . Depression 12/18/2012  . Weakness of right side of body 04/19/2013  . CVA (cerebral infarction) 04/19/2013    Current Outpatient Prescriptions on File Prior to Visit  Medication Sig Dispense Refill  . amitriptyline (ELAVIL) 10 MG tablet Take 10 mg by mouth at bedtime as needed.      Marland Kitchen amitriptyline (ELAVIL) 50 MG tablet 1 tab po qhs x 7 days then 2 tabs po qhs  60 tablet  2  . aspirin EC 81 MG tablet Take 1 tablet  (81 mg total) by mouth daily.  30 tablet    . chlorthalidone (HYGROTON) 25 MG tablet Take 1 tablet (25 mg total) by mouth daily.  30 tablet  3  . clopidogrel (PLAVIX) 75 MG tablet Take 75 mg by mouth daily.      . Efinaconazole (JUBLIA) 10 % SOLN Apply 1 application topically daily.  4 mL  5  . glucose blood (FREESTYLE LITE) test strip 1 each by Other route daily. DX- 250.02  Pts machine is Freestyle Freedom Lite  100 each  5  . ibuprofen (ADVIL,MOTRIN) 400 MG tablet Take 1 tablet (400 mg total) by mouth 2 (two) times daily as needed for pain (with food).  60 tablet  2  . Insulin Glargine (LANTUS) 100 UNIT/ML Solostar Pen 42 units SQ daily and increase by 2 unites every 3 days as long as numbers remain above 100  5 pen  5  . Insulin Pen Needle 31G X 8 MM MISC USE AS DIRECTED TO INJECT INSULIN Dx: 250.02  50 each  5  . Krill Oil CAPS Take 1 capsule by mouth daily.  30 capsule    . Lancets (FREESTYLE) lancets DX- 250.02  Pts machine is Freestyle Freedom Lite  100 each  5  . lisinopril (PRINIVIL,ZESTRIL) 10 MG tablet Take 1 tablet (10 mg total) by mouth 2 (two) times daily.  60 tablet  5  . metFORMIN (GLUCOPHAGE) 1000 MG tablet 1 tab po bid and 1/2 tab po q noon  75 tablet  5  . metoprolol (  TOPROL-XL) 25 MG 24 hr tablet Take 25 mg by mouth daily.        . ondansetron (ZOFRAN ODT) 8 MG disintegrating tablet Take 1 tablet (8 mg total) by mouth every 8 (eight) hours as needed for nausea or vomiting.  20 tablet  0  . pravastatin (PRAVACHOL) 10 MG tablet Take 1 tablet (10 mg total) by mouth daily.  30 tablet  3  . pregabalin (LYRICA) 50 MG capsule 1 tab po tid x 1 week then increase to 2 tabs po tid  180 capsule  2   No current facility-administered medications on file prior to visit.    No Known Allergies  Family History  Problem Relation Age of Onset  . Arthritis Mother   . Stroke Brother     previous smoker  . Alcohol abuse Brother     in remission  . Leukemia Brother   . Diabetes  Paternal Grandmother     History   Social History  . Marital Status: Single    Spouse Name: N/A    Number of Children: N/A  . Years of Education: N/A   Social History Main Topics  . Smoking status: Former Smoker -- 0.50 packs/day    Quit date: 09/08/2005  . Smokeless tobacco: Never Used  . Alcohol Use: No     Comment: occasional  . Drug Use: No  . Sexual Activity: Yes    Partners: Male   Other Topics Concern  . None   Social History Narrative  . None   Review of Systems - See HPI.  All other ROS are negative.  BP 152/96  Pulse 90  Temp(Src) 98 F (36.7 C) (Oral)  Resp 16  Ht 5' 2.5" (1.588 m)  Wt 140 lb 4 oz (63.617 kg)  BMI 25.23 kg/m2  SpO2 99%  LMP 02/12/2011  Physical Exam  Vitals reviewed. Constitutional: She is oriented to person, place, and time and well-developed, well-nourished, and in no distress.  HENT:  Head: Normocephalic and atraumatic.  Eyes: Conjunctivae and EOM are normal. Pupils are equal, round, and reactive to light.  Neck: Neck supple.  Cardiovascular: Normal rate, regular rhythm, normal heart sounds and intact distal pulses.   Pulmonary/Chest: Effort normal and breath sounds normal. No respiratory distress. She has no wheezes. She has no rales. She exhibits no tenderness.  Musculoskeletal:       Left hip: She exhibits decreased strength, tenderness and bony tenderness. She exhibits no swelling.       Left knee: Normal. No tenderness found.       Left ankle: Normal.       Lumbar back: She exhibits tenderness, pain and spasm.       Left upper leg: Normal.       Left lower leg: Normal.  Neurological: She is alert and oriented to person, place, and time.  Skin: Skin is warm and dry. No rash noted.  Psychiatric: Affect normal.    Recent Results (from the past 2160 hour(s))  CBC     Status: None   Collection Time    11/07/13  5:11 PM      Result Value Ref Range   WBC 8.3  4.0 - 10.5 K/uL   RBC 4.70  3.87 - 5.11 MIL/uL   Hemoglobin  14.0  12.0 - 15.0 g/dL   HCT 40.3  36.0 - 46.0 %   MCV 85.7  78.0 - 100.0 fL   MCH 29.8  26.0 - 34.0 pg   MCHC  34.7  30.0 - 36.0 g/dL   RDW 13.3  11.5 - 15.5 %   Platelets 330  150 - 400 K/uL  TSH     Status: None   Collection Time    11/07/13  5:11 PM      Result Value Ref Range   TSH 3.068  0.350 - 4.500 uIU/mL  RENAL FUNCTION PANEL     Status: Abnormal   Collection Time    11/07/13  5:11 PM      Result Value Ref Range   Sodium 139  135 - 145 mEq/L   Potassium 4.3  3.5 - 5.3 mEq/L   Chloride 102  96 - 112 mEq/L   CO2 31  19 - 32 mEq/L   Glucose, Bld 247 (*) 70 - 99 mg/dL   BUN 14  6 - 23 mg/dL   Creat 0.54  0.50 - 1.10 mg/dL   Albumin 4.0  3.5 - 5.2 g/dL   Calcium 9.3  8.4 - 10.5 mg/dL   Phosphorus 3.6  2.3 - 4.6 mg/dL  HEMOGLOBIN A1C     Status: Abnormal   Collection Time    11/07/13  5:11 PM      Result Value Ref Range   Hemoglobin A1C 12.7 (*) <5.7 %   Comment:                                                                            According to the ADA Clinical Practice Recommendations for 2011, when     HbA1c is used as a screening test:             >=6.5%   Diagnostic of Diabetes Mellitus                (if abnormal result is confirmed)           5.7-6.4%   Increased risk of developing Diabetes Mellitus           References:Diagnosis and Classification of Diabetes Mellitus,Diabetes     CBUL,8453,64(WOEHO 1):S62-S69 and Standards of Medical Care in             Diabetes - 2011,Diabetes Care,2011,34 (Suppl 1):S11-S61.         Mean Plasma Glucose 318 (*) <117 mg/dL  HEPATIC FUNCTION PANEL     Status: None   Collection Time    11/07/13  5:11 PM      Result Value Ref Range   Total Bilirubin 0.5  0.2 - 1.2 mg/dL   Bilirubin, Direct 0.1  0.0 - 0.3 mg/dL   Indirect Bilirubin 0.4  0.2 - 1.2 mg/dL   Alkaline Phosphatase 78  39 - 117 U/L   AST 14  0 - 37 U/L   ALT 20  0 - 35 U/L   Total Protein 6.8  6.0 - 8.3 g/dL   Albumin 4.0  3.5 - 5.2 g/dL  LIPID PANEL      Status: None   Collection Time    11/07/13  5:11 PM      Result Value Ref Range   Cholesterol 173  0 - 200 mg/dL   Comment: ATP III Classification:           <  200        mg/dL        Desirable          200 - 239     mg/dL        Borderline High          >= 240        mg/dL        High         Triglycerides 148  <150 mg/dL   HDL 48  >39 mg/dL   Total CHOL/HDL Ratio 3.6     VLDL 30  0 - 40 mg/dL   LDL Cholesterol 95  0 - 99 mg/dL   Comment:       Total Cholesterol/HDL Ratio:CHD Risk                            Coronary Heart Disease Risk Table                                            Men       Women              1/2 Average Risk              3.4        3.3                  Average Risk              5.0        4.4               2X Average Risk              9.6        7.1               3X Average Risk             23.4       11.0     Use the calculated Patient Ratio above and the CHD Risk table      to determine the patient's CHD Risk.     ATP III Classification (LDL):           < 100        mg/dL         Optimal          100 - 129     mg/dL         Near or Above Optimal          130 - 159     mg/dL         Borderline High          160 - 189     mg/dL         High           > 190        mg/dL         Very High        CBC WITH DIFFERENTIAL     Status: None   Collection Time    12/24/13 10:55 AM      Result Value Ref Range   WBC 6.6  4.0 - 10.5 K/uL   RBC 4.80  3.87 - 5.11 MIL/uL   Hemoglobin 14.4  12.0 - 15.0 g/dL   HCT 41.4  36.0 - 46.0 %   MCV 86.3  78.0 - 100.0 fL   MCH 30.0  26.0 - 34.0 pg   MCHC 34.8  30.0 - 36.0 g/dL   RDW 12.3  11.5 - 15.5 %   Platelets 328  150 - 400 K/uL   Neutrophils Relative % 60  43 - 77 %   Neutro Abs 3.9  1.7 - 7.7 K/uL   Lymphocytes Relative 26  12 - 46 %   Lymphs Abs 1.7  0.7 - 4.0 K/uL   Monocytes Relative 9  3 - 12 %   Monocytes Absolute 0.6  0.1 - 1.0 K/uL   Eosinophils Relative 4  0 - 5 %   Eosinophils Absolute 0.3  0.0 - 0.7 K/uL    Basophils Relative 1  0 - 1 %   Basophils Absolute 0.1  0.0 - 0.1 K/uL  BASIC METABOLIC PANEL     Status: Abnormal   Collection Time    12/24/13 10:55 AM      Result Value Ref Range   Sodium 139  137 - 147 mEq/L   Potassium 4.4  3.7 - 5.3 mEq/L   Chloride 99  96 - 112 mEq/L   CO2 29  19 - 32 mEq/L   Glucose, Bld 360 (*) 70 - 99 mg/dL   BUN 14  6 - 23 mg/dL   Creatinine, Ser 0.50  0.50 - 1.10 mg/dL   Calcium 9.7  8.4 - 10.5 mg/dL   GFR calc non Af Amer >90  >90 mL/min   GFR calc Af Amer >90  >90 mL/min   Comment: (NOTE)     The eGFR has been calculated using the CKD EPI equation.     This calculation has not been validated in all clinical situations.     eGFR's persistently <90 mL/min signify possible Chronic Kidney     Disease.  URINALYSIS, ROUTINE W REFLEX MICROSCOPIC     Status: Abnormal   Collection Time    12/24/13 10:55 AM      Result Value Ref Range   Color, Urine YELLOW  YELLOW   APPearance CLEAR  CLEAR   Specific Gravity, Urine 1.026  1.005 - 1.030   pH 7.5  5.0 - 8.0   Glucose, UA >1000 (*) NEGATIVE mg/dL   Hgb urine dipstick NEGATIVE  NEGATIVE   Bilirubin Urine NEGATIVE  NEGATIVE   Ketones, ur NEGATIVE  NEGATIVE mg/dL   Protein, ur NEGATIVE  NEGATIVE mg/dL   Urobilinogen, UA 0.2  0.0 - 1.0 mg/dL   Nitrite NEGATIVE  NEGATIVE   Leukocytes, UA NEGATIVE  NEGATIVE  URINE MICROSCOPIC-ADD ON     Status: None   Collection Time    12/24/13 10:55 AM      Result Value Ref Range   Squamous Epithelial / LPF RARE  RARE   WBC, UA 0-2  <3 WBC/hpf   RBC / HPF 0-2  <3 RBC/hpf   Bacteria, UA RARE  RARE   Urine-Other RARE YEAST    GLUCOSE, POCT (MANUAL RESULT ENTRY)     Status: Abnormal   Collection Time    01/06/14  8:47 AM      Result Value Ref Range   POC Glucose 290 (*) 70 - 99 mg/dl   Comment: Patient reports no medication and/or Insulin today/sls   Assessment/Plan: Back pain Medications refill. Conservative measures discussed. Continue Lyrica. Will set up with  specialist for further evaluation.  Hip pain  Given the severe groin/hip pain and patient's history of smoking, will obtain x-ray of her left hip. Pain medication refill. Conservative measures discussed. Patient then referred to orthopedics for her back pain.  Diabetes mellitus type II, uncontrolled Discussed with patient importance of being compliant with her medication regimen. Take medication as directed. Check glucose at home. Record findings. Followup with primary care provider in 3 weeks.  ESSENTIAL HYPERTENSION, BENIGN BP elevated. Asymptomatic. Patient has not taken her medication. Instructed patient to take medication as directed. DASH handout given. Patient is aware of the risk associated with uncontrolled blood pressure and blood sugar levels.

## 2014-01-13 ENCOUNTER — Encounter: Payer: Self-pay | Admitting: Vascular Surgery

## 2014-01-13 ENCOUNTER — Ambulatory Visit (HOSPITAL_COMMUNITY)
Admission: RE | Admit: 2014-01-13 | Discharge: 2014-01-13 | Disposition: A | Discharge status: Home / Self Care | Payer: BC Managed Care – PPO | Source: Ambulatory Visit | Attending: Vascular Surgery | Admitting: Vascular Surgery

## 2014-01-13 ENCOUNTER — Ambulatory Visit (INDEPENDENT_AMBULATORY_CARE_PROVIDER_SITE_OTHER): Payer: BC Managed Care – PPO | Admitting: Vascular Surgery

## 2014-01-13 VITALS — BP 169/101 | HR 87 | Ht 62.5 in | Wt 140.1 lb

## 2014-01-13 DIAGNOSIS — G5793 Unspecified mononeuropathy of bilateral lower limbs: Secondary | ICD-10-CM | POA: Insufficient documentation

## 2014-01-13 DIAGNOSIS — G609 Hereditary and idiopathic neuropathy, unspecified: Secondary | ICD-10-CM

## 2014-01-13 DIAGNOSIS — M79609 Pain in unspecified limb: Secondary | ICD-10-CM | POA: Insufficient documentation

## 2014-01-13 NOTE — Telephone Encounter (Signed)
Already faxed back last week

## 2014-01-13 NOTE — Progress Notes (Signed)
[facility] [place] HISTORY AND PHYSICAL   CC:  Ongoing BLE pain/numbness  [name], [name]  HPI: This is a 48 y.o. female who saw Dr. Bridgett Davis a couple of years ago with c/o pain and numbness in her lower extremities.  Her ABI's at that time were normal.  She presents today with the same symptoms, but the numbness has gone up the leg further and she c/o left thigh pain and left groin pain.  Her skin on left thigh is tender to touch.  She is IDDM and is now taking insulin and she states that her glucose is under much better control.  She states that she has remote tobacco hx.    She states that she presented to the urgent care on [address] a couple of weekends ago for the left groin pain.  On xray, a lesion was found and she is being referred to orthopedics, which she has an appt on Thursday.  She is on a baby ASA, plavix.  She is on insulin and metformin for her diabetes.  She is on a statin for her cholesterol and is on a beta blocker for her HTN.   Past Medical History  Diagnosis Date  . Hypertension   . Hyperlipidemia 12/06/2010  . Overweight 12/06/2010  . NEPHROLITHIASIS, HX OF 10/07/2010  . RESTLESS LEG SYNDROME 10/25/2010  . PERIPHERAL NEUROPATHY, FEET 10/07/2010  . HEART MURMUR, HX OF 10/07/2010  . Essential hypertension, benign 10/07/2010  . DM 10/07/2010  . Disturbance of skin sensation 10/07/2010  . COMMON MIGRAINE 10/07/2010  . Acute bronchitis 07/08/2011  . Diabetes mellitus type II, uncontrolled 10/07/2010    Qualifier: Diagnosis of  By: Charlett Blake [name], [name]    . PVD (peripheral vascular disease) 01/21/2012  . SOB (shortness of breath) 04/01/2012  . Depression 12/18/2012  . Weakness of right side of body 04/19/2013  . CVA (cerebral infarction) 04/19/2013  . Stroke    History reviewed. No pertinent past surgical history.  Allergies  Allergen Reactions  . Morphine And Related Nausea And Vomiting    Current Outpatient Prescriptions  Medication Sig Dispense Refill   . amitriptyline (ELAVIL) 10 MG tablet Take 10 mg by mouth at bedtime as needed.      Marland Kitchen amitriptyline (ELAVIL) 50 MG tablet 1 tab po qhs x 7 days then 2 tabs po qhs  60 tablet  2  . aspirin EC 81 MG tablet Take 1 tablet (81 mg total) by mouth daily.  30 tablet    . chlorthalidone (HYGROTON) 25 MG tablet Take 1 tablet (25 mg total) by mouth daily.  30 tablet  3  . clopidogrel (PLAVIX) 75 MG tablet Take 75 mg by mouth daily.      . cyclobenzaprine (FLEXERIL) 5 MG tablet Take 1 tablet (5 mg total) by mouth at bedtime as needed for muscle spasms.  30 tablet  0  . Efinaconazole (JUBLIA) 10 % SOLN Apply 1 application topically daily.  4 mL  5  . glucose blood (FREESTYLE LITE) test strip 1 each by Other route daily. DX- 250.02  Pts machine is Freestyle Freedom Lite  100 each  5  . ibuprofen (ADVIL,MOTRIN) 400 MG tablet Take 1 tablet (400 mg total) by mouth 2 (two) times daily as needed for pain (with food).  60 tablet  2  . Insulin Glargine (LANTUS) 100 UNIT/ML Solostar Pen 42 units SQ daily and increase by 2 unites every 3 days as long as numbers remain above 100  5 pen  5  . Insulin Pen Needle 31G X 8 MM MISC USE AS DIRECTED TO INJECT INSULIN Dx: 250.02  50 each  5  . Krill Oil CAPS Take 1 capsule by mouth daily.  30 capsule    . Lancets (FREESTYLE) lancets DX- 250.02  Pts machine is Freestyle Freedom Lite  100 each  5  . lisinopril (PRINIVIL,ZESTRIL) 10 MG tablet Take 1 tablet (10 mg total) by mouth 2 (two) times daily.  60 tablet  5  . metFORMIN (GLUCOPHAGE) 1000 MG tablet 1 tab po bid and 1/2 tab po q noon  75 tablet  5  . metoprolol (TOPROL-XL) 25 MG 24 hr tablet Take 25 mg by mouth daily.        . ondansetron (ZOFRAN ODT) 8 MG disintegrating tablet Take 1 tablet (8 mg total) by mouth every 8 (eight) hours as needed for nausea or vomiting.  20 tablet  0  . oxyCODONE-acetaminophen (ROXICET) 5-325 MG per tablet Take 1-2 tablets by mouth every 8 (eight) hours as needed for moderate pain or severe  pain.  30 tablet  0  . pravastatin (PRAVACHOL) 10 MG tablet Take 1 tablet (10 mg total) by mouth daily.  30 tablet  3  . pregabalin (LYRICA) 50 MG capsule 1 tab po tid x 1 week then increase to 2 tabs po tid  180 capsule  2   No current facility-administered medications for this visit.    Family History  Problem Relation Age of Onset  . Arthritis Mother   . Stroke Brother     previous smoker  . Alcohol abuse Brother     in remission  . Leukemia Brother   . Diabetes Paternal Grandmother     History   Social History  . Marital Status: Single    Spouse Name: N/A    Number of Children: N/A  . Years of Education: N/A   Occupational History  . Not on file.   Social History Main Topics  . Smoking status: Former Smoker -- 0.50 packs/day    Quit date: 09/08/2005  . Smokeless tobacco: Never Used  . Alcohol Use: No     Comment: occasional  . Drug Use: No  . Sexual Activity: Yes    Partners: Male   Other Topics Concern  . Not on file   Social History Narrative  . No narrative on file     ROS: [x]  Positive   [ ]  Negative   [ ]  All sytems reviewed and are negative  Cardiovascular: []  chest pain/pressure []  palpitations []  SOB lying flat []  DOE [x]  pain in legs while walking [x]  pain in feet when lying flat []  hx of DVT []  hx of phlebitis []  swelling in legs []  varicose veins  Pulmonary: []  productive cough []  asthma []  wheezing  Neurologic: [x]  weakness in []  arms [x]  legs [x]  numbness in []  arms [x]  feet and legs to about mid calf bilaterally [] difficulty speaking or slurred speech []  temporary loss of vision in one eye []  dizziness  Hematologic: []  bleeding problems []  problems with blood clotting easily  GI []  vomiting blood []  blood in stool  GU: []  burning with urination []  blood in urine  Psychiatric: []  hx of major depression  Integumentary: []  rashes []  ulcers  Constitutional: []  fever []  chills   PHYSICAL EXAMINATION:  Filed  Vitals:   01/13/14 1414  BP: 169/101  Pulse: 87   Body mass index is 25.2 kg/(m^2).  General:  WDWN  in NAD Gait: Not observed HENT: WNL, normocephalic Eyes: Pupils equal Pulmonary: normal non-labored breathing , without Rales, rhonchi,  wheezing Cardiac: RRR, without  Murmurs, rubs or gallops; without carotid bruits Abdomen: soft, NT, no masses Skin: without rashes, without ulcers  Vascular Exam/Pulses:  Right Left  Radial 2+ (normal) 2+ (normal)  Ulnar Non palpable 1+ (weak)  Femoral 2+ (normal) 2+ (normal)  Popliteal Non palpable Non palpable  DP 2+ (normal) 2+ (normal)  PT Non palpable  1+ (weak)   Extremities: without ischemic changes, without Gangrene , without cellulitis; without open wounds;  Musculoskeletal: no muscle wasting or atrophy  Neurologic: A&O X 3; Appropriate Affect ; SENSATION: normal; MOTOR FUNCTION:  moving all extremities equally. Speech is fluent/normal   Non-Invasive Vascular Imaging:    ABI's: Right:  1.13 (previous 1.18) Left:  1.16 (previous 1.22)  Pt meds includes: Statin:  yes Beta Blocker:  yes Aspirin:  yes ACEI:  no ARB:  no Other Antiplatelet/Anticoagulant:  yes-Plavix   ASSESSMENT/PLAN: 48 y.o. female who is IDDM with peripheral neuropathy without evidence of PAD.   -she states that her IDDM is under better control and this is extremely important as this is a risk factor for the development of PAD.  She has a remote tobacco hx and is not currently smoking, which works greatly in her favor. -continue ASA/Plavix -Dr. Bridgett Davis discussed the nature of atherosclerosis and continued to stress the importance of strict cholesterol control, blood pressure control, ASA, and exercise.  She expresses understanding. -she has appt to f/u with orthopedics on Thursday for lesion seen on xray a couple of weeks ago.   Tiffany Locket, [name] [facility] [phone]  Clinic [name]:  Pt seen and examined in conjunction with Dr.  Bridgett Davis  Addendum  I have independently interviewed and examined the patient, and I agree with the physician assistant's findings.  Pt does not have any significant PAD at this point despite having IDDM already.  We discussed the secondary prevention strategy for PAD.  Patient can follow up with [name] as needed.  Tiffany Barthel, [name] [facility] [place] Office: [phone] Pager: [phone]  01/13/2014, 5:55 PM

## 2014-01-18 ENCOUNTER — Telehealth: Payer: Self-pay | Admitting: Family Medicine

## 2014-01-18 DIAGNOSIS — E119 Type 2 diabetes mellitus without complications: Secondary | ICD-10-CM

## 2014-01-18 NOTE — Telephone Encounter (Signed)
Refill-metformin  [id number] [place], [place]

## 2014-01-19 ENCOUNTER — Encounter (HOSPITAL_COMMUNITY)
Admission: RE | Admit: 2014-01-19 | Discharge: 2014-01-19 | Disposition: A | Discharge status: Home / Self Care | Payer: BC Managed Care – PPO | Source: Ambulatory Visit | Attending: Physician Assistant | Admitting: Physician Assistant

## 2014-01-19 ENCOUNTER — Ambulatory Visit (HOSPITAL_COMMUNITY)
Admission: RE | Admit: 2014-01-19 | Discharge: 2014-01-19 | Disposition: A | Discharge status: Home / Self Care | Payer: BC Managed Care – PPO | Source: Ambulatory Visit | Attending: Physician Assistant | Admitting: Physician Assistant

## 2014-01-19 DIAGNOSIS — M545 Low back pain, unspecified: Secondary | ICD-10-CM | POA: Insufficient documentation

## 2014-01-19 DIAGNOSIS — M898X5 Other specified disorders of bone, thigh: Secondary | ICD-10-CM

## 2014-01-19 DIAGNOSIS — M25559 Pain in unspecified hip: Secondary | ICD-10-CM | POA: Insufficient documentation

## 2014-01-19 DIAGNOSIS — M25569 Pain in unspecified knee: Secondary | ICD-10-CM | POA: Insufficient documentation

## 2014-01-19 DIAGNOSIS — M259 Joint disorder, unspecified: Secondary | ICD-10-CM | POA: Insufficient documentation

## 2014-01-19 MED ORDER — TECHNETIUM TC 99M MEDRONATE IV KIT
26.7000 | PACK | Freq: Once | INTRAVENOUS | Status: AC | PRN
  Administered 2014-01-19: 26.7 via INTRAVENOUS

## 2014-01-19 MED ORDER — METFORMIN HCL 1000 MG PO TABS: ORAL_TABLET | ORAL | Status: DC

## 2014-01-27 ENCOUNTER — Encounter: Payer: Self-pay | Admitting: Physician Assistant

## 2014-01-27 ENCOUNTER — Ambulatory Visit (INDEPENDENT_AMBULATORY_CARE_PROVIDER_SITE_OTHER): Payer: BC Managed Care – PPO | Admitting: Physician Assistant

## 2014-01-27 VITALS — BP 142/88 | HR 91 | Temp 98.0°F | Resp 16 | Ht 62.5 in | Wt 142.2 lb

## 2014-01-27 DIAGNOSIS — M79609 Pain in unspecified limb: Secondary | ICD-10-CM

## 2014-01-27 NOTE — Progress Notes (Signed)
Pre visit review using our clinic review tool, if applicable. No additional management support is needed unless otherwise documented below in the visit note/SLS  

## 2014-01-27 NOTE — Assessment & Plan Note (Signed)
Unclear etiology.  Patient with extensive neuropathy. Has had workup with Vascular surgery that was unremarkable for vascular cause of symptoms.  Increase Lyrica to follow prescription instructions -- 2 tablets TID.  Continue pain medications.  Apply topical NSAID. Will send to Sports medicine.

## 2014-01-27 NOTE — Progress Notes (Signed)
Patient presents to clinic today c/o continued pain in left thigh.  Patient with extensive diabetic neuropathy.  X-ray of hip obtained at last visit due to hip pain complaints revealing possible lytic lesion but malignancy was ruled/out with PET scan.  No other abnormalities noted.  Patient has alspo seen vascular surgery for her symptoms.  [name] ABI unremarkable.  Was told by Vasc surgery that this was probably the "best" her legs would ever be.  Has prescription for Lyrica which she is supposed to be taking 2 tablets TID, but she has only been taking 2 tablets once daily.  Takes her Norco as directed.  Denies new symptoms. Still describes pain as soreness with light touch.    Past Medical History  Diagnosis Date  . Hypertension   . Hyperlipidemia 12/06/2010  . Overweight 12/06/2010  . NEPHROLITHIASIS, HX OF 10/07/2010  . RESTLESS LEG SYNDROME 10/25/2010  . PERIPHERAL NEUROPATHY, FEET 10/07/2010  . HEART MURMUR, HX OF 10/07/2010  . Essential hypertension, benign 10/07/2010  . DM 10/07/2010  . Disturbance of skin sensation 10/07/2010  . COMMON MIGRAINE 10/07/2010  . Acute bronchitis 07/08/2011  . Diabetes mellitus type II, uncontrolled 10/07/2010    Qualifier: Diagnosis of  By: Charlett Blake MD, [name]    . PVD (peripheral vascular disease) 01/21/2012  . SOB (shortness of breath) 04/01/2012  . Depression 12/18/2012  . Weakness of right side of body 04/19/2013  . CVA (cerebral infarction) 04/19/2013  . Stroke     Current Outpatient Prescriptions on File Prior to Visit  Medication Sig Dispense Refill  . amitriptyline (ELAVIL) 50 MG tablet 1 tab po qhs x 7 days then 2 tabs po qhs  60 tablet  2  . aspirin EC 81 MG tablet Take 1 tablet (81 mg total) by mouth daily.  30 tablet    . chlorthalidone (HYGROTON) 25 MG tablet Take 1 tablet (25 mg total) by mouth daily.  30 tablet  3  . clopidogrel (PLAVIX) 75 MG tablet Take 75 mg by mouth daily.      . cyclobenzaprine (FLEXERIL) 5 MG tablet Take 1 tablet (5 mg total) by  mouth at bedtime as needed for muscle spasms.  30 tablet  0  . Efinaconazole (JUBLIA) 10 % SOLN Apply 1 application topically daily.  4 mL  5  . glucose blood (FREESTYLE LITE) test strip 1 each by Other route daily. DX- 250.02  Pts machine is Freestyle Freedom Lite  100 each  5  . HYDROcodone-acetaminophen (NORCO/VICODIN) 5-325 MG per tablet       . ibuprofen (ADVIL,MOTRIN) 400 MG tablet Take 1 tablet (400 mg total) by mouth 2 (two) times daily as needed for pain (with food).  60 tablet  2  . Insulin Glargine (LANTUS) 100 UNIT/ML Solostar Pen 42 units SQ daily and increase by 2 unites every 3 days as long as numbers remain above 100  5 pen  5  . Insulin Pen Needle 31G X 8 MM MISC USE AS DIRECTED TO INJECT INSULIN Dx: 250.02  50 each  5  . Krill Oil CAPS Take 1 capsule by mouth daily.  30 capsule    . Lancets (FREESTYLE) lancets DX- 250.02  Pts machine is Freestyle Freedom Lite  100 each  5  . lisinopril (PRINIVIL,ZESTRIL) 10 MG tablet Take 1 tablet (10 mg total) by mouth 2 (two) times daily.  60 tablet  5  . metFORMIN (GLUCOPHAGE) 1000 MG tablet 1 tab po bid and 1/2 tab po q noon  75  tablet  5  . metoprolol (TOPROL-XL) 25 MG 24 hr tablet Take 25 mg by mouth daily.        . ondansetron (ZOFRAN ODT) 8 MG disintegrating tablet Take 1 tablet (8 mg total) by mouth every 8 (eight) hours as needed for nausea or vomiting.  20 tablet  0  . oxyCODONE-acetaminophen (ROXICET) 5-325 MG per tablet Take 1-2 tablets by mouth every 8 (eight) hours as needed for moderate pain or severe pain.  30 tablet  0  . pravastatin (PRAVACHOL) 10 MG tablet Take 1 tablet (10 mg total) by mouth daily.  30 tablet  3  . pregabalin (LYRICA) 50 MG capsule 1 tab po tid x 1 week then increase to 2 tabs po tid  180 capsule  2   No current facility-administered medications on file prior to visit.    Allergies  Allergen Reactions  . Morphine And Related Nausea And Vomiting    Family History  Problem Relation Age of Onset  .  Arthritis Mother   . Stroke Brother     previous smoker  . Alcohol abuse Brother     in remission  . Leukemia Brother   . Diabetes Paternal Grandmother     History   Social History  . Marital Status: Single    Spouse Name: N/A    Number of Children: N/A  . Years of Education: N/A   Social History Main Topics  . Smoking status: Former Smoker -- 0.50 packs/day    Quit date: 09/08/2005  . Smokeless tobacco: Never Used  . Alcohol Use: No     Comment: occasional  . Drug Use: No  . Sexual Activity: Yes    Partners: Male   Other Topics Concern  . None   Social History Narrative  . None   Review of Systems - See HPI.  All other ROS are negative.  BP 142/88  Pulse 91  Temp(Src) 98 F (36.7 C) (Oral)  Resp 16  Ht 5' 2.5" (1.588 m)  Wt 142 lb 4 oz (64.524 kg)  BMI 25.59 kg/m2  SpO2 98%  LMP 02/12/2011  Physical Exam  Vitals reviewed. Constitutional: She is oriented to person, place, and time and well-developed, well-nourished, and in no distress.  Cardiovascular: Normal rate, regular rhythm, normal heart sounds and intact distal pulses.   Pulmonary/Chest: Effort normal and breath sounds normal. No respiratory distress. She has no wheezes. She has no rales. She exhibits no tenderness.  Musculoskeletal:       Left upper leg: She exhibits tenderness. She exhibits no bony tenderness, no swelling, no edema, no deformity and no laceration.  Tenderness with soft touch of skin with burning sensation.  Neurological: She is alert and oriented to person, place, and time.    Recent Results (from the past 2160 hour(s))  CBC     Status: None   Collection Time    11/07/13  5:11 PM      Result Value Ref Range   WBC 8.3  4.0 - 10.5 K/uL   RBC 4.70  3.87 - 5.11 MIL/uL   Hemoglobin 14.0  12.0 - 15.0 g/dL   HCT 40.3  36.0 - 46.0 %   MCV 85.7  78.0 - 100.0 fL   MCH 29.8  26.0 - 34.0 pg   MCHC 34.7  30.0 - 36.0 g/dL   RDW 13.3  11.5 - 15.5 %   Platelets 330  150 - 400 K/uL  TSH      Status: None  Collection Time    11/07/13  5:11 PM      Result Value Ref Range   TSH 3.068  0.350 - 4.500 uIU/mL  RENAL FUNCTION PANEL     Status: Abnormal   Collection Time    11/07/13  5:11 PM      Result Value Ref Range   Sodium 139  135 - 145 mEq/L   Potassium 4.3  3.5 - 5.3 mEq/L   Chloride 102  96 - 112 mEq/L   CO2 31  19 - 32 mEq/L   Glucose, Bld 247 (*) 70 - 99 mg/dL   BUN 14  6 - 23 mg/dL   Creat 0.54  0.50 - 1.10 mg/dL   Albumin 4.0  3.5 - 5.2 g/dL   Calcium 9.3  8.4 - 10.5 mg/dL   Phosphorus 3.6  2.3 - 4.6 mg/dL  HEMOGLOBIN A1C     Status: Abnormal   Collection Time    11/07/13  5:11 PM      Result Value Ref Range   Hemoglobin A1C 12.7 (*) <5.7 %   Comment:                                                                            According to the ADA Clinical Practice Recommendations for 2011, when     HbA1c is used as a screening test:             >=6.5%   Diagnostic of Diabetes Mellitus                (if abnormal result is confirmed)           5.7-6.4%   Increased risk of developing Diabetes Mellitus           References:Diagnosis and Classification of Diabetes Mellitus,Diabetes     QION,6295,28(UXLKG 1):S62-S69 and Standards of Medical Care in             Diabetes - 2011,Diabetes Care,2011,34 (Suppl 1):S11-S61.         Mean Plasma Glucose 318 (*) <117 mg/dL  HEPATIC FUNCTION PANEL     Status: None   Collection Time    11/07/13  5:11 PM      Result Value Ref Range   Total Bilirubin 0.5  0.2 - 1.2 mg/dL   Bilirubin, Direct 0.1  0.0 - 0.3 mg/dL   Indirect Bilirubin 0.4  0.2 - 1.2 mg/dL   Alkaline Phosphatase 78  39 - 117 U/L   AST 14  0 - 37 U/L   ALT 20  0 - 35 U/L   Total Protein 6.8  6.0 - 8.3 g/dL   Albumin 4.0  3.5 - 5.2 g/dL  LIPID PANEL     Status: None   Collection Time    11/07/13  5:11 PM      Result Value Ref Range   Cholesterol 173  0 - 200 mg/dL   Comment: ATP III Classification:           < 200        mg/dL        Desirable           200 - 239     mg/dL  Borderline High          >= 240        mg/dL        High         Triglycerides 148  <150 mg/dL   HDL 48  >39 mg/dL   Total CHOL/HDL Ratio 3.6     VLDL 30  0 - 40 mg/dL   LDL Cholesterol 95  0 - 99 mg/dL   Comment:       Total Cholesterol/HDL Ratio:CHD Risk                            Coronary Heart Disease Risk Table                                            Men       Women              1/2 Average Risk              3.4        3.3                  Average Risk              5.0        4.4               2X Average Risk              9.6        7.1               3X Average Risk             23.4       11.0     Use the calculated Patient Ratio above and the CHD Risk table      to determine the patient's CHD Risk.     ATP III Classification (LDL):           < 100        mg/dL         Optimal          100 - 129     mg/dL         Near or Above Optimal          130 - 159     mg/dL         Borderline High          160 - 189     mg/dL         High           > 190        mg/dL         Very High        CBC WITH DIFFERENTIAL     Status: None   Collection Time    12/24/13 10:55 AM      Result Value Ref Range   WBC 6.6  4.0 - 10.5 K/uL   RBC 4.80  3.87 - 5.11 MIL/uL   Hemoglobin 14.4  12.0 - 15.0 g/dL   HCT 41.4  36.0 - 46.0 %   MCV 86.3  78.0 - 100.0 fL   MCH 30.0  26.0 - 34.0 pg   MCHC 34.8  30.0 - 36.0 g/dL  RDW 12.3  11.5 - 15.5 %   Platelets 328  150 - 400 K/uL   Neutrophils Relative % 60  43 - 77 %   Neutro Abs 3.9  1.7 - 7.7 K/uL   Lymphocytes Relative 26  12 - 46 %   Lymphs Abs 1.7  0.7 - 4.0 K/uL   Monocytes Relative 9  3 - 12 %   Monocytes Absolute 0.6  0.1 - 1.0 K/uL   Eosinophils Relative 4  0 - 5 %   Eosinophils Absolute 0.3  0.0 - 0.7 K/uL   Basophils Relative 1  0 - 1 %   Basophils Absolute 0.1  0.0 - 0.1 K/uL  BASIC METABOLIC PANEL     Status: Abnormal   Collection Time    12/24/13 10:55 AM      Result Value Ref Range   Sodium 139  137  - 147 mEq/L   Potassium 4.4  3.7 - 5.3 mEq/L   Chloride 99  96 - 112 mEq/L   CO2 29  19 - 32 mEq/L   Glucose, Bld 360 (*) 70 - 99 mg/dL   BUN 14  6 - 23 mg/dL   Creatinine, Ser 0.50  0.50 - 1.10 mg/dL   Calcium 9.7  8.4 - 10.5 mg/dL   GFR calc non Af Amer >90  >90 mL/min   GFR calc Af Amer >90  >90 mL/min   Comment: (NOTE)     The eGFR has been calculated using the CKD EPI equation.     This calculation has not been validated in all clinical situations.     eGFR's persistently <90 mL/min signify possible Chronic Kidney     Disease.  URINALYSIS, ROUTINE W REFLEX MICROSCOPIC     Status: Abnormal   Collection Time    12/24/13 10:55 AM      Result Value Ref Range   Color, Urine YELLOW  YELLOW   APPearance CLEAR  CLEAR   Specific Gravity, Urine 1.026  1.005 - 1.030   pH 7.5  5.0 - 8.0   Glucose, UA >1000 (*) NEGATIVE mg/dL   Hgb urine dipstick NEGATIVE  NEGATIVE   Bilirubin Urine NEGATIVE  NEGATIVE   Ketones, ur NEGATIVE  NEGATIVE mg/dL   Protein, ur NEGATIVE  NEGATIVE mg/dL   Urobilinogen, UA 0.2  0.0 - 1.0 mg/dL   Nitrite NEGATIVE  NEGATIVE   Leukocytes, UA NEGATIVE  NEGATIVE  URINE MICROSCOPIC-ADD ON     Status: None   Collection Time    12/24/13 10:55 AM      Result Value Ref Range   Squamous Epithelial / LPF RARE  RARE   WBC, UA 0-2  <3 WBC/hpf   RBC / HPF 0-2  <3 RBC/hpf   Bacteria, UA RARE  RARE   Urine-Other RARE YEAST    GLUCOSE, POCT (MANUAL RESULT ENTRY)     Status: Abnormal   Collection Time    01/06/14  8:47 AM      Result Value Ref Range   POC Glucose 290 (*) 70 - 99 mg/dl   Comment: Patient reports no medication and/or Insulin today/sls   Assessment/Plan: Pain in limb Unclear etiology.  Patient with extensive neuropathy. Has had workup with Vascular surgery that was unremarkable for vascular cause of symptoms.  Increase Lyrica to follow prescription instructions -- 2 tablets TID.  Continue pain medications.  Apply topical NSAID. Will send to Sports  medicine.

## 2014-01-27 NOTE — Patient Instructions (Signed)
You are not taking enough of your Lyrica to help with nerve pain.  Please increase to 2 tablets twice a day for 3-5 days.  Then increase to 2 tablets three times per day.  This is the strength and dosing the prescription was written for.  Continue your other medications as directed.  Apply a topical Icy Hot or Aspercreme to your left leg.  You will be contacted for an appointment with Dr. Barbaraann Barthel for further evaluation to r/o muscular component of symptoms.

## 2014-02-01 ENCOUNTER — Ambulatory Visit: Payer: BC Managed Care – PPO | Admitting: Family Medicine

## 2014-02-07 ENCOUNTER — Ambulatory Visit: Payer: BC Managed Care – PPO | Admitting: Family Medicine

## 2014-02-09 ENCOUNTER — Encounter: Payer: Self-pay | Admitting: Family Medicine

## 2014-02-09 ENCOUNTER — Ambulatory Visit (INDEPENDENT_AMBULATORY_CARE_PROVIDER_SITE_OTHER): Payer: BC Managed Care – PPO | Admitting: Family Medicine

## 2014-02-09 VITALS — BP 146/96 | HR 86 | Temp 97.9°F | Ht 62.5 in | Wt 144.0 lb

## 2014-02-09 DIAGNOSIS — E785 Hyperlipidemia, unspecified: Secondary | ICD-10-CM

## 2014-02-09 DIAGNOSIS — IMO0002 Reserved for concepts with insufficient information to code with codable children: Secondary | ICD-10-CM

## 2014-02-09 DIAGNOSIS — E119 Type 2 diabetes mellitus without complications: Secondary | ICD-10-CM

## 2014-02-09 DIAGNOSIS — IMO0001 Reserved for inherently not codable concepts without codable children: Secondary | ICD-10-CM

## 2014-02-09 DIAGNOSIS — G609 Hereditary and idiopathic neuropathy, unspecified: Secondary | ICD-10-CM

## 2014-02-09 DIAGNOSIS — M79609 Pain in unspecified limb: Secondary | ICD-10-CM

## 2014-02-09 DIAGNOSIS — E1149 Type 2 diabetes mellitus with other diabetic neurological complication: Secondary | ICD-10-CM

## 2014-02-09 DIAGNOSIS — I10 Essential (primary) hypertension: Secondary | ICD-10-CM

## 2014-02-09 DIAGNOSIS — E1165 Type 2 diabetes mellitus with hyperglycemia: Secondary | ICD-10-CM

## 2014-02-09 LAB — HEMOGLOBIN A1C
Hgb A1c MFr Bld: 11.9 % — ABNORMAL HIGH (ref <5.7)
Mean Plasma Glucose: 295 mg/dL — ABNORMAL HIGH (ref <117)

## 2014-02-09 LAB — LIPID PANEL
Cholesterol: 166 mg/dL (ref 0–200)
HDL: 41 mg/dL (ref >39)
LDL Cholesterol: 114 mg/dL — ABNORMAL HIGH (ref 0–99)
Total CHOL/HDL Ratio: 4 Ratio
Triglycerides: 55 mg/dL (ref <150)
VLDL: 11 mg/dL (ref 0–40)

## 2014-02-09 LAB — CBC
HCT: 37.8 % (ref 36.0–46.0)
Hemoglobin: 13.1 g/dL (ref 12.0–15.0)
MCH: 29.6 pg (ref 26.0–34.0)
MCHC: 34.7 g/dL (ref 30.0–36.0)
MCV: 85.3 fL (ref 78.0–100.0)
Platelets: 324 10*3/uL (ref 150–400)
RBC: 4.43 MIL/uL (ref 3.87–5.11)
RDW: 13.7 % (ref 11.5–15.5)
WBC: 6.6 10*3/uL (ref 4.0–10.5)

## 2014-02-09 LAB — RENAL FUNCTION PANEL
Albumin: 3.8 g/dL (ref 3.5–5.2)
BUN: 11 mg/dL (ref 6–23)
CO2: 27 mEq/L (ref 19–32)
Calcium: 9.4 mg/dL (ref 8.4–10.5)
Chloride: 101 mEq/L (ref 96–112)
Creat: 0.56 mg/dL (ref 0.50–1.10)
Glucose, Bld: 277 mg/dL — ABNORMAL HIGH (ref 70–99)
Phosphorus: 3.4 mg/dL (ref 2.3–4.6)
Potassium: 4.6 mEq/L (ref 3.5–5.3)
Sodium: 137 mEq/L (ref 135–145)

## 2014-02-09 LAB — URIC ACID: Uric Acid, Serum: 3.1 mg/dL (ref 2.4–7.0)

## 2014-02-09 LAB — HEPATIC FUNCTION PANEL
ALT: 19 U/L (ref 0–35)
AST: 17 U/L (ref 0–37)
Albumin: 3.8 g/dL (ref 3.5–5.2)
Alkaline Phosphatase: 94 U/L (ref 39–117)
Bilirubin, Direct: 0.1 mg/dL (ref 0.0–0.3)
Indirect Bilirubin: 0.4 mg/dL (ref 0.2–1.2)
Total Bilirubin: 0.5 mg/dL (ref 0.2–1.2)
Total Protein: 6.8 g/dL (ref 6.0–8.3)

## 2014-02-09 MED ORDER — INSULIN GLARGINE 100 UNIT/ML SOLOSTAR PEN: PEN_INJECTOR | SUBCUTANEOUS | Status: DC

## 2014-02-09 MED ORDER — INSULIN LISPRO 100 UNIT/ML (KWIKPEN): 10.0000 [IU] | PEN_INJECTOR | Freq: Every day | SUBCUTANEOUS | Status: DC

## 2014-02-09 NOTE — Progress Notes (Signed)
Pre visit review using our clinic review tool, if applicable. No additional management support is needed unless otherwise documented below in the visit note. 

## 2014-02-09 NOTE — Patient Instructions (Signed)

## 2014-02-09 NOTE — Progress Notes (Signed)
[name], female   DOB: [birth year], 48 y.o.   MRN: [id number] AJWA KIMBERLEY [id number] [birth year] 02/09/2014      Progress Note-Follow Up  Subjective  Chief Complaint  Chief Complaint  Patient presents with  . Follow-up    HPI  Patient is a 48 year old female in today for routine medical care. Patient is in today for followup on numerous medical conditions. Notes blood sugar continues to be on high numbers 200-400 not unusual. No numbers below 100. Does have some polyuria and polydipsia. Continues to have pain and paresthesias in bilateral legs the Lyrica has been helpful. Continues to have significant pain in left leg and has seen vascular surgery. They have ruled out the vascular system being the cause of her pain. 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 Denies CP/palp/SOB/HA/congestion/fevers/GI or GU c/o. Taking meds as prescribed  Past Medical History  Diagnosis Date  . Hypertension   . Hyperlipidemia 12/06/2010  . Overweight 12/06/2010  . NEPHROLITHIASIS, HX OF 10/07/2010  . RESTLESS LEG SYNDROME 10/25/2010  . PERIPHERAL NEUROPATHY, FEET 10/07/2010  . HEART MURMUR, HX OF 10/07/2010  . Essential hypertension, benign 10/07/2010  . DM 10/07/2010  . Disturbance of skin sensation 10/07/2010  . COMMON MIGRAINE 10/07/2010  . Acute bronchitis 07/08/2011  . Diabetes mellitus type II, uncontrolled 10/07/2010    Qualifier: Diagnosis of  By: Charlett Blake MD, [name]    . PVD (peripheral vascular disease) 01/21/2012  . SOB (shortness of breath) 04/01/2012  . Depression 12/18/2012  . Weakness of right side of body 04/19/2013  . CVA (cerebral infarction) 04/19/2013  . Stroke     History reviewed. No pertinent past surgical history.  Family History  Problem Relation Age of Onset  . Arthritis Mother   . Stroke Brother     previous smoker  . Alcohol abuse Brother     in remission  . Leukemia Brother   . Diabetes Paternal Grandmother      History   Social History  . Marital Status: Single    Spouse Name: N/A    Number of Children: N/A  . Years of Education: N/A   Occupational History  . Not on file.   Social History Main Topics  . Smoking status: Former Smoker -- 0.50 packs/day    Quit date: 09/08/2005  . Smokeless tobacco: Never Used  . Alcohol Use: No     Comment: occasional  . Drug Use: No  . Sexual Activity: Yes    Partners: Male   Other Topics Concern  . Not on file   Social History Narrative  . No narrative on file    Current Outpatient Prescriptions on File Prior to Visit  Medication Sig Dispense Refill  . amitriptyline (ELAVIL) 50 MG tablet 1 tab po qhs x 7 days then 2 tabs po qhs  60 tablet  2  . aspirin EC 81 MG tablet Take 1 tablet (81 mg total) by mouth daily.  30 tablet    . chlorthalidone (HYGROTON) 25 MG tablet Take 1 tablet (25 mg total) by mouth daily.  30 tablet  3  . clopidogrel (PLAVIX) 75 MG tablet Take 75 mg by mouth daily.      . cyclobenzaprine (FLEXERIL) 5 MG tablet Take 1 tablet (5 mg total) by mouth at bedtime as needed for muscle spasms.  30 tablet  0  . Efinaconazole (JUBLIA) 10 % SOLN Apply 1 application topically daily.  4 mL  5  . glucose blood (FREESTYLE LITE) test strip 1 each by Other route daily. DX- 250.02  Pts machine is Freestyle Freedom Lite  100 each  5  . HYDROcodone-acetaminophen (NORCO/VICODIN) 5-325 MG per tablet       . ibuprofen (ADVIL,MOTRIN) 400 MG tablet Take 1 tablet (400 mg total) by mouth 2 (two) times daily as needed for pain (with food).  60 tablet  2  . Insulin Glargine (LANTUS) 100 UNIT/ML Solostar Pen 42 units SQ daily and increase by 2 unites every 3 days as long as numbers remain above 100  5 pen  5  . Insulin Pen Needle 31G X 8 MM MISC USE AS DIRECTED TO INJECT INSULIN Dx: 250.02  50 each  5  . Krill Oil CAPS Take 1 capsule by mouth daily.  30 capsule    . Lancets (FREESTYLE) lancets DX- 250.02  Pts machine is Freestyle Freedom Lite  100  each  5  . lisinopril (PRINIVIL,ZESTRIL) 10 MG tablet Take 1 tablet (10 mg total) by mouth 2 (two) times daily.  60 tablet  5  . metFORMIN (GLUCOPHAGE) 1000 MG tablet 1 tab po bid and 1/2 tab po q noon  75 tablet  5  . metoprolol (TOPROL-XL) 25 MG 24 hr tablet Take 25 mg by mouth daily.        . ondansetron (ZOFRAN ODT) 8 MG disintegrating tablet Take 1 tablet (8 mg total) by mouth every 8 (eight) hours as needed for nausea or vomiting.  20 tablet  0  . oxyCODONE-acetaminophen (ROXICET) 5-325 MG per tablet Take 1-2 tablets by mouth every 8 (eight) hours as needed for moderate pain or severe pain.  30 tablet  0  . pravastatin (PRAVACHOL) 10 MG tablet Take 1 tablet (10 mg total) by mouth daily.  30 tablet  3  . pregabalin (LYRICA) 50 MG capsule 1 tab po tid x 1 week then increase to 2 tabs po tid  180 capsule  2   No current facility-administered medications on file prior to visit.    Allergies  Allergen Reactions  . Morphine And Related Nausea And Vomiting    Review of Systems  Review of Systems  Constitutional: Positive for malaise/fatigue. Negative for fever.  HENT: Negative for congestion.   Eyes: Negative for discharge.  Respiratory: Negative for shortness of breath.   Cardiovascular: Negative for chest pain, palpitations and leg swelling.  Gastrointestinal: Negative for nausea, abdominal pain and diarrhea.  Genitourinary: Negative for dysuria.  Musculoskeletal: Positive for joint pain. Negative for falls.       Left leg pain is the worst today  Skin: Negative for rash.  Neurological: Positive for tingling. Negative for loss of consciousness and headaches.  Endo/Heme/Allergies: Negative for polydipsia.  Psychiatric/Behavioral: Negative for depression and suicidal ideas. The patient is not nervous/anxious and does not have insomnia.     Objective  BP 148/108  Pulse 86  Temp(Src) 97.9 F (36.6 C) (Oral)  Ht 5' 2.5" (1.588 m)  Wt 144 lb (65.318 kg)  BMI 25.90 kg/m2  SpO2  98%  LMP 02/12/2011  Physical Exam  Physical Exam  Constitutional: She is oriented to person, place, and time and well-developed, well-nourished, and in no distress. No distress.  HENT:  Head: Normocephalic and atraumatic.  Eyes: Conjunctivae are normal.  Neck: Neck supple. No thyromegaly present.  Cardiovascular: Normal rate, regular rhythm and normal heart sounds.   No murmur heard. Pulmonary/Chest: Effort normal and breath sounds normal. She  has no wheezes.  Abdominal: She exhibits no distension and no mass.  Musculoskeletal: She exhibits no edema.  Lymphadenopathy:    She has no cervical adenopathy.  Neurological: She is alert and oriented to person, place, and time.  Skin: Skin is warm and dry. No rash noted. She is not diaphoretic.  Psychiatric: Memory, affect and judgment normal.    Lab Results  Component Value Date   TSH 3.068 11/07/2013   Lab Results  Component Value Date   WBC 6.6 12/24/2013   HGB 14.4 12/24/2013   HCT 41.4 12/24/2013   MCV 86.3 12/24/2013   PLT 328 12/24/2013   Lab Results  Component Value Date   CREATININE 0.50 12/24/2013   BUN 14 12/24/2013   NA 139 12/24/2013   K 4.4 12/24/2013   CL 99 12/24/2013   CO2 29 12/24/2013   Lab Results  Component Value Date   ALT 20 11/07/2013   AST 14 11/07/2013   ALKPHOS 78 11/07/2013   BILITOT 0.5 11/07/2013   Lab Results  Component Value Date   CHOL 173 11/07/2013   Lab Results  Component Value Date   HDL 48 11/07/2013   Lab Results  Component Value Date   LDLCALC 95 11/07/2013   Lab Results  Component Value Date   TRIG 148 11/07/2013   Lab Results  Component Value Date   CHOLHDL 3.6 11/07/2013     Assessment & Plan  Diabetes mellitus type II, uncontrolled Poorly controlled, once again offered endocrinology referral and declines. Minimize simple carbs and eat frequent meals with protein. Increase Lantus as directed and add Humalog to largest meal  ESSENTIAL HYPERTENSION, BENIGN Well controlled, no changes  to meds. Encouraged heart healthy diet such as the DASH diet and exercise as tolerated.   Unspecified hereditary and idiopathic peripheral neuropathy Tolerating Lyrica and is helping her pain  Hyperlipidemia Tolerating statin, encouraged heart healthy diet, avoid trans fats, minimize simple carbs and saturated fats. Increase exercise as tolerated  Pain in limb Vascular surgery has ruled out circulation being the cause of her pain, will proceed with sports med consult

## 2014-02-10 ENCOUNTER — Ambulatory Visit (INDEPENDENT_AMBULATORY_CARE_PROVIDER_SITE_OTHER): Payer: BC Managed Care – PPO | Admitting: Family Medicine

## 2014-02-10 VITALS — BP 164/103 | Ht 62.0 in | Wt 140.0 lb

## 2014-02-10 DIAGNOSIS — M79609 Pain in unspecified limb: Secondary | ICD-10-CM

## 2014-02-10 LAB — TSH: TSH: 2.562 u[IU]/mL (ref 0.350–4.500)

## 2014-02-10 NOTE — Patient Instructions (Signed)
I believe this is more a nerve issue than a muscle or bone issues. Do hip inside raises, standing hip rotations, and hacky sack exercises 3 sets of 10 once a day. Add ankle weight if these become too easy. I would make an appointment with your neurologist for further evaluation. Can consider prednisone, physical therapy as options depending on their evaluation.  I doubt physical therapy would help much beyond these home exercises we reviewed.

## 2014-02-12 NOTE — Assessment & Plan Note (Signed)
Tolerating statin, encouraged heart healthy diet, avoid trans fats, minimize simple carbs and saturated fats. Increase exercise as tolerated 

## 2014-02-12 NOTE — Assessment & Plan Note (Signed)
Vascular surgery has ruled out circulation being the cause of her pain, will proceed with sports med consult

## 2014-02-12 NOTE — Assessment & Plan Note (Addendum)
Mildly elevated with pain and without meds, no changes to meds. Encouraged heart healthy diet such as the DASH diet and exercise as tolerated.

## 2014-02-12 NOTE — Assessment & Plan Note (Signed)
Tolerating Lyrica and is helping her pain

## 2014-02-12 NOTE — Assessment & Plan Note (Signed)
Poorly controlled, once again offered endocrinology referral and declines. Minimize simple carbs and eat frequent meals with protein. Increase Lantus as directed and add Humalog to largest meal

## 2014-02-16 ENCOUNTER — Encounter: Payer: Self-pay | Admitting: Family Medicine

## 2014-02-16 NOTE — Assessment & Plan Note (Signed)
exam is reassuring and suggestive of a neuropathy and not an orthopedic issue or herniated disc from her back.  I did provide her with basic hip strengthening exercises but doubt they will be of benefit for her current pain.  Advised she follow up with her neurologist for evaluation.

## 2014-02-16 NOTE — Progress Notes (Signed)
[name], female   DOB: 20-Sep-1965, 48 y.o.   MRN: [id number]  PCP: Penni Homans, MD  Subjective:   HPI: Patient is a 48 y.o. female here for left thigh pain.  Patient denies known injury or trauma. States about 1 month ago started to get pain on the inside of her left thigh. No increase in activity level. Has history of neuropathy in both feet but this feels different. Vascular surgery has evaluated her and stated it's not related to that. Hurts with even light touch of medial upper leg. Not worse with any specific motions. No pain in back.  Past Medical History  Diagnosis Date  . Hypertension   . Hyperlipidemia 12/06/2010  . Overweight 12/06/2010  . NEPHROLITHIASIS, HX OF 10/07/2010  . RESTLESS LEG SYNDROME 10/25/2010  . PERIPHERAL NEUROPATHY, FEET 10/07/2010  . HEART MURMUR, HX OF 10/07/2010  . Essential hypertension, benign 10/07/2010  . DM 10/07/2010  . Disturbance of skin sensation 10/07/2010  . COMMON MIGRAINE 10/07/2010  . Acute bronchitis 07/08/2011  . Diabetes mellitus type II, uncontrolled 10/07/2010    Qualifier: Diagnosis of  By: Charlett Blake MD, [name]    . PVD (peripheral vascular disease) 01/21/2012  . SOB (shortness of breath) 04/01/2012  . Depression 12/18/2012  . Weakness of right side of body 04/19/2013  . CVA (cerebral infarction) 04/19/2013  . Stroke     Current Outpatient Prescriptions on File Prior to Visit  Medication Sig Dispense Refill  . amitriptyline (ELAVIL) 50 MG tablet 1 tab po qhs x 7 days then 2 tabs po qhs  60 tablet  2  . aspirin EC 81 MG tablet Take 1 tablet (81 mg total) by mouth daily.  30 tablet    . chlorthalidone (HYGROTON) 25 MG tablet Take 1 tablet (25 mg total) by mouth daily.  30 tablet  3  . clopidogrel (PLAVIX) 75 MG tablet Take 75 mg by mouth daily.      . cyclobenzaprine (FLEXERIL) 5 MG tablet Take 1 tablet (5 mg total) by mouth at bedtime as needed for muscle spasms.  30 tablet  0  . Efinaconazole (JUBLIA) 10 % SOLN Apply  1 application topically daily.  4 mL  5  . glucose blood (FREESTYLE LITE) test strip 1 each by Other route daily. DX- 250.02  Pts machine is Freestyle Freedom Lite  100 each  5  . HYDROcodone-acetaminophen (NORCO/VICODIN) 5-325 MG per tablet       . ibuprofen (ADVIL,MOTRIN) 400 MG tablet Take 1 tablet (400 mg total) by mouth 2 (two) times daily as needed for pain (with food).  60 tablet  2  . Insulin Glargine (LANTUS) 100 UNIT/ML Solostar Pen 50 units SQ daily and increase by 2 unites every 3 days as long as numbers remain above 100  5 pen  5  . insulin lispro (HUMALOG KWIKPEN) 100 UNIT/ML KiwkPen Inject 0.1 mLs (10 Units total) into the skin daily. With largest  15 mL  2  . Insulin Pen Needle 31G X 8 MM MISC USE AS DIRECTED TO INJECT INSULIN Dx: 250.02  50 each  5  . Krill Oil CAPS Take 1 capsule by mouth daily.  30 capsule    . Lancets (FREESTYLE) lancets DX- 250.02  Pts machine is Freestyle Freedom Lite  100 each  5  . lisinopril (PRINIVIL,ZESTRIL) 10 MG tablet Take 1 tablet (10 mg total) by mouth 2 (two) times daily.  60 tablet  5  . metFORMIN (GLUCOPHAGE) 1000 MG tablet 1  tab po bid and 1/2 tab po q noon  75 tablet  5  . metoprolol (TOPROL-XL) 25 MG 24 hr tablet Take 25 mg by mouth daily.        . ondansetron (ZOFRAN ODT) 8 MG disintegrating tablet Take 1 tablet (8 mg total) by mouth every 8 (eight) hours as needed for nausea or vomiting.  20 tablet  0  . oxyCODONE-acetaminophen (ROXICET) 5-325 MG per tablet Take 1-2 tablets by mouth every 8 (eight) hours as needed for moderate pain or severe pain.  30 tablet  0  . pravastatin (PRAVACHOL) 10 MG tablet Take 1 tablet (10 mg total) by mouth daily.  30 tablet  3  . pregabalin (LYRICA) 50 MG capsule 1 tab po tid x 1 week then increase to 2 tabs po tid  180 capsule  2   No current facility-administered medications on file prior to visit.    History reviewed. No pertinent past surgical history.  Allergies  Allergen Reactions  . Morphine And  Related Nausea And Vomiting    History   Social History  . Marital Status: Single    Spouse Name: N/A    Number of Children: N/A  . Years of Education: N/A   Occupational History  . Not on file.   Social History Main Topics  . Smoking status: Former Smoker -- 0.50 packs/day    Quit date: 09/08/2005  . Smokeless tobacco: Never Used  . Alcohol Use: No     Comment: occasional  . Drug Use: No  . Sexual Activity: Yes    Partners: Male   Other Topics Concern  . Not on file   Social History Narrative  . No narrative on file    Family History  Problem Relation Age of Onset  . Arthritis Mother   . Stroke Brother     previous smoker  . Alcohol abuse Brother     in remission  . Leukemia Brother   . Diabetes Paternal Grandmother     BP 164/103  Ht 5\' 2"  (1.575 m)  Wt 140 lb (63.504 kg)  BMI 25.60 kg/m2  LMP 02/12/2011  Review of Systems: See HPI above.    Objective:  Physical Exam:  Gen: NAD  Left leg/Back: No gross deformity, scoliosis. TTP with  Light touch medial left thigh area and to palpation throughout, not within specific muscle distribution.  No back, hip TTP. FROM hip and back without pain. Strength LEs 5/5 all muscle groups.  No pain with resisted hip motions. 1+ MSRs in patellar and achilles tendons, equal bilaterally. Negative SLRs. Sensation intact to light touch bilaterally. Negative logroll bilateral hips Negative fabers and piriformis stretches.    Assessment & Plan:  1. Left leg pain - exam is reassuring and suggestive of a neuropathy and not an orthopedic issue or herniated disc from her back.  I did provide her with basic hip strengthening exercises but doubt they will be of benefit for her current pain.  Advised she follow up with her neurologist for evaluation.

## 2014-03-14 ENCOUNTER — Ambulatory Visit: Payer: BC Managed Care – PPO | Admitting: Family Medicine

## 2014-04-17 ENCOUNTER — Telehealth: Payer: Self-pay

## 2014-04-17 NOTE — Telephone Encounter (Signed)
Diabetic bundle:  Too early to recheck LDL, A1C was done on 02-12-14

## 2014-04-28 ENCOUNTER — Ambulatory Visit (HOSPITAL_BASED_OUTPATIENT_CLINIC_OR_DEPARTMENT_OTHER)
Admission: RE | Admit: 2014-04-28 | Discharge: 2014-04-28 | Disposition: A | Discharge status: Home / Self Care | Payer: BC Managed Care – PPO | Source: Ambulatory Visit | Attending: Physician Assistant | Admitting: Physician Assistant

## 2014-04-28 ENCOUNTER — Encounter: Payer: Self-pay | Admitting: Physician Assistant

## 2014-04-28 ENCOUNTER — Ambulatory Visit (INDEPENDENT_AMBULATORY_CARE_PROVIDER_SITE_OTHER): Payer: BC Managed Care – PPO | Admitting: Physician Assistant

## 2014-04-28 VITALS — BP 144/90 | HR 83 | Temp 97.9°F | Resp 16 | Ht 62.5 in | Wt 142.5 lb

## 2014-04-28 DIAGNOSIS — M7730 Calcaneal spur, unspecified foot: Secondary | ICD-10-CM | POA: Diagnosis not present

## 2014-04-28 DIAGNOSIS — M25579 Pain in unspecified ankle and joints of unspecified foot: Secondary | ICD-10-CM | POA: Diagnosis present

## 2014-04-28 DIAGNOSIS — M25572 Pain in left ankle and joints of left foot: Secondary | ICD-10-CM

## 2014-04-28 MED ORDER — HYDROCODONE-ACETAMINOPHEN 10-325 MG PO TABS: 1.0000 | ORAL_TABLET | Freq: Three times a day (TID) | ORAL | Status: DC | PRN

## 2014-04-28 MED ORDER — MELOXICAM 15 MG PO TABS: ORAL_TABLET | ORAL | Status: DC

## 2014-04-28 NOTE — Progress Notes (Signed)
Patient presents to clinic today c/o sharp pain on the lateral plantar surface of her left foot x 1.5 weeks.  Pain is stabbing in nature with radiation into the ankle and lower shin.  Patient denies trauma or injury. Denies bruising or pallor of extremity.  Endorses numbness but no change from baseline neuropathy.   Past Medical History  Diagnosis Date  . Hypertension   . Hyperlipidemia 12/06/2010  . Overweight(278.02) 12/06/2010  . NEPHROLITHIASIS, HX OF 10/07/2010  . RESTLESS LEG SYNDROME 10/25/2010  . PERIPHERAL NEUROPATHY, FEET 10/07/2010  . HEART MURMUR, HX OF 10/07/2010  . Essential hypertension, benign 10/07/2010  . DM 10/07/2010  . Disturbance of skin sensation 10/07/2010  . COMMON MIGRAINE 10/07/2010  . Acute bronchitis 07/08/2011  . Diabetes mellitus type II, uncontrolled 10/07/2010    Qualifier: Diagnosis of  By: Charlett Blake MD, [name]    . PVD (peripheral vascular disease) 01/21/2012  . SOB (shortness of breath) 04/01/2012  . Depression 12/18/2012  . Weakness of right side of body 04/19/2013  . CVA (cerebral infarction) 04/19/2013  . Stroke     Current Outpatient Prescriptions on File Prior to Visit  Medication Sig Dispense Refill  . amitriptyline (ELAVIL) 50 MG tablet 1 tab po qhs x 7 days then 2 tabs po qhs  60 tablet  2  . aspirin EC 81 MG tablet Take 1 tablet (81 mg total) by mouth daily.  30 tablet    . chlorthalidone (HYGROTON) 25 MG tablet Take 1 tablet (25 mg total) by mouth daily.  30 tablet  3  . clopidogrel (PLAVIX) 75 MG tablet Take 75 mg by mouth daily.      . cyclobenzaprine (FLEXERIL) 5 MG tablet Take 1 tablet (5 mg total) by mouth at bedtime as needed for muscle spasms.  30 tablet  0  . Efinaconazole (JUBLIA) 10 % SOLN Apply 1 application topically daily.  4 mL  5  . glucose blood (FREESTYLE LITE) test strip 1 each by Other route daily. DX- 250.02  Pts machine is Freestyle Freedom Lite  100 each  5  . ibuprofen (ADVIL,MOTRIN) 400 MG tablet Take 1 tablet (400 mg total) by  mouth 2 (two) times daily as needed for pain (with food).  60 tablet  2  . Insulin Glargine (LANTUS) 100 UNIT/ML Solostar Pen 50 units SQ daily and increase by 2 unites every 3 days as long as numbers remain above 100  5 pen  5  . insulin lispro (HUMALOG KWIKPEN) 100 UNIT/ML KiwkPen Inject 0.1 mLs (10 Units total) into the skin daily. With largest  15 mL  2  . Insulin Pen Needle 31G X 8 MM MISC USE AS DIRECTED TO INJECT INSULIN Dx: 250.02  50 each  5  . Krill Oil CAPS Take 1 capsule by mouth daily.  30 capsule    . Lancets (FREESTYLE) lancets DX- 250.02  Pts machine is Freestyle Freedom Lite  100 each  5  . lisinopril (PRINIVIL,ZESTRIL) 10 MG tablet Take 1 tablet (10 mg total) by mouth 2 (two) times daily.  60 tablet  5  . metFORMIN (GLUCOPHAGE) 1000 MG tablet 1 tab po bid and 1/2 tab po q noon  75 tablet  5  . metoprolol (TOPROL-XL) 25 MG 24 hr tablet Take 25 mg by mouth daily.        . ondansetron (ZOFRAN ODT) 8 MG disintegrating tablet Take 1 tablet (8 mg total) by mouth every 8 (eight) hours as needed for nausea or vomiting.  [place]  tablet  0  . pravastatin (PRAVACHOL) 10 MG tablet Take 1 tablet (10 mg total) by mouth daily.  30 tablet  3  . pregabalin (LYRICA) 50 MG capsule 1 tab po tid x 1 week then increase to 2 tabs po tid  180 capsule  2   No current facility-administered medications on file prior to visit.    Allergies  Allergen Reactions  . Morphine And Related Nausea And Vomiting    Family History  Problem Relation Age of Onset  . Arthritis Mother   . Stroke Brother     previous smoker  . Alcohol abuse Brother     in remission  . Leukemia Brother   . Diabetes Paternal Grandmother     History   Social History  . Marital Status: Single    Spouse Name: N/A    Number of Children: N/A  . Years of Education: N/A   Social History Main Topics  . Smoking status: Former Smoker -- 0.50 packs/day    Quit date: 09/08/2005  . Smokeless tobacco: Never Used  . Alcohol Use: No      Comment: occasional  . Drug Use: No  . Sexual Activity: Yes    Partners: Male   Other Topics Concern  . None   Social History Narrative  . None   Review of Systems - See HPI.  All other ROS are negative.  BP 144/90  Pulse 83  Temp(Src) 97.9 F (36.6 C) (Oral)  Resp 16  Ht 5' 2.5" (1.588 m)  Wt 142 lb 8 oz (64.638 kg)  BMI 25.63 kg/m2  SpO2 99%  LMP 02/12/2011  Physical Exam  Constitutional: She is oriented to person, place, and time and well-developed, well-nourished, and in no distress.  HENT:  Head: Normocephalic and atraumatic.  Eyes: Conjunctivae are normal.  Cardiovascular: Normal rate, regular rhythm, normal heart sounds and intact distal pulses.   Pulmonary/Chest: Effort normal and breath sounds normal. No respiratory distress. She has no wheezes. She has no rales. She exhibits no tenderness.  Musculoskeletal:       Left ankle: She exhibits swelling. She exhibits normal range of motion, no ecchymosis, no deformity and normal pulse. Tenderness. Lateral malleolus tenderness found. No medial malleolus, no AITFL, no CF ligament, no posterior TFL, no head of 5th metatarsal and no proximal fibula tenderness found. Achilles tendon normal.       Left foot: She exhibits tenderness and swelling. She exhibits normal range of motion, no bony tenderness, normal capillary refill and no deformity.  Neurological: She is alert and oriented to person, place, and time.  Skin: Skin is warm and dry. No rash noted.  Psychiatric: Affect normal.   Recent Results (from the past 2160 hour(s))  HEMOGLOBIN A1C     Status: Abnormal   Collection Time    02/09/14  8:39 AM      Result Value Ref Range   Hemoglobin A1C 11.9 (*) <5.7 %   Comment:                                                                            According to the ADA Clinical Practice Recommendations for 2011, when     HbA1c is used  as a screening test:             >=6.5%   Diagnostic of Diabetes Mellitus                 (if abnormal result is confirmed)           5.7-6.4%   Increased risk of developing Diabetes Mellitus           References:Diagnosis and Classification of Diabetes Mellitus,Diabetes     KZSW,1093,23(FTDDU 1):S62-S69 and Standards of Medical Care in             Diabetes - 2011,Diabetes KGUR,4270,62 (Suppl 1):S11-S61.         Mean Plasma Glucose 295 (*) <117 mg/dL  LIPID PANEL     Status: Abnormal   Collection Time    02/09/14  8:39 AM      Result Value Ref Range   Cholesterol 166  0 - 200 mg/dL   Comment: ATP III Classification:           < 200        mg/dL        Desirable          200 - 239     mg/dL        Borderline High          >= 240        mg/dL        High         Triglycerides 55  <150 mg/dL   HDL 41  >39 mg/dL   Total CHOL/HDL Ratio 4.0     VLDL 11  0 - 40 mg/dL   LDL Cholesterol 114 (*) 0 - 99 mg/dL   Comment:       Total Cholesterol/HDL Ratio:CHD Risk                            Coronary Heart Disease Risk Table                                            Men       Women              1/2 Average Risk              3.4        3.3                  Average Risk              5.0        4.4               2X Average Risk              9.6        7.1               3X Average Risk             23.4       11.0     Use the calculated Patient Ratio above and the CHD Risk table      to determine the patient's CHD Risk.     ATP III Classification (LDL):           < 100  mg/dL         Optimal          100 - 129     mg/dL         Near or Above Optimal          130 - 159     mg/dL         Borderline High          160 - 189     mg/dL         High           > 190        mg/dL         Very High        HEPATIC FUNCTION PANEL     Status: None   Collection Time    02/09/14  8:39 AM      Result Value Ref Range   Total Bilirubin 0.5  0.2 - 1.2 mg/dL   Bilirubin, Direct 0.1  0.0 - 0.3 mg/dL   Indirect Bilirubin 0.4  0.2 - 1.2 mg/dL   Alkaline Phosphatase 94  39 - 117 U/L   AST 17   0 - 37 U/L   ALT 19  0 - 35 U/L   Total Protein 6.8  6.0 - 8.3 g/dL   Albumin 3.8  3.5 - 5.2 g/dL  RENAL FUNCTION PANEL     Status: Abnormal   Collection Time    02/09/14  8:39 AM      Result Value Ref Range   Sodium 137  135 - 145 mEq/L   Potassium 4.6  3.5 - 5.3 mEq/L   Chloride 101  96 - 112 mEq/L   CO2 27  19 - 32 mEq/L   Glucose, Bld 277 (*) 70 - 99 mg/dL   BUN 11  6 - 23 mg/dL   Creat 0.56  0.50 - 1.10 mg/dL   Albumin 3.8  3.5 - 5.2 g/dL   Calcium 9.4  8.4 - 10.5 mg/dL   Phosphorus 3.4  2.3 - 4.6 mg/dL  CBC     Status: None   Collection Time    02/09/14  8:39 AM      Result Value Ref Range   WBC 6.6  4.0 - 10.5 K/uL   RBC 4.43  3.87 - 5.11 MIL/uL   Hemoglobin 13.1  12.0 - 15.0 g/dL   HCT 37.8  36.0 - 46.0 %   MCV 85.3  78.0 - 100.0 fL   MCH 29.6  26.0 - 34.0 pg   MCHC 34.7  30.0 - 36.0 g/dL   RDW 13.7  11.5 - 15.5 %   Platelets 324  150 - 400 K/uL  TSH     Status: None   Collection Time    02/09/14  8:39 AM      Result Value Ref Range   TSH 2.562  0.350 - 4.500 uIU/mL  URIC ACID     Status: None   Collection Time    02/09/14  8:39 AM      Result Value Ref Range   Uric Acid, Serum 3.1  2.4 - 7.0 mg/dL    Assessment/Plan: Pain in joint, ankle and foot There is tenderness with palpation over the plantar surface of foot.  Could potentially be moderate-severe plantar fascitis.  However, there is also swelling and tenderness at the lateral malleolus.  Will obtain X-ray of left foot and ankle.  Encourage RICE therapy.  Mobic daily with  food.  Rx Norco to take later in the day if needed for severe pain.  May need referral to Ortho, pending imaging results.

## 2014-04-28 NOTE — Progress Notes (Signed)
Pre visit review using our clinic review tool, if applicable. No additional management support is needed unless otherwise documented below in the visit note/SLS  

## 2014-04-28 NOTE — Patient Instructions (Signed)
Please take Mobic once daily with food.  Keep the foot wrapped in an ACE bandage.  Can take Norco later in the day if needed for pain.  Apply topical Aspercreme to the area.  Rest.  I will know more once your x-ray results are in.  Do not forget to go to the Imaging Dept this morning before leaving the [facility].

## 2014-04-28 NOTE — Assessment & Plan Note (Signed)
There is tenderness with palpation over the plantar surface of foot.  Could potentially be moderate-severe plantar fascitis.  However, there is also swelling and tenderness at the lateral malleolus.  Will obtain X-ray of left foot and ankle.  Encourage RICE therapy.  Mobic daily with food.  Rx Norco to take later in the day if needed for severe pain.  May need referral to Ortho, pending imaging results.

## 2014-05-02 ENCOUNTER — Telehealth: Payer: Self-pay | Admitting: Family Medicine

## 2014-05-02 DIAGNOSIS — E1149 Type 2 diabetes mellitus with other diabetic neurological complication: Secondary | ICD-10-CM

## 2014-05-02 MED ORDER — INSULIN GLARGINE 100 UNIT/ML SOLOSTAR PEN: PEN_INJECTOR | SUBCUTANEOUS | Status: DC

## 2014-05-02 NOTE — Telephone Encounter (Signed)
Refill-lantus  [facility] [id number]

## 2014-05-02 NOTE — Telephone Encounter (Signed)
Patient is requesting last x-ray results

## 2014-05-02 NOTE — Telephone Encounter (Signed)
Left detailed message on vm and asked patient to return our call if she would like to proceed with the referral to orthopedist

## 2014-05-04 ENCOUNTER — Other Ambulatory Visit: Payer: Self-pay | Admitting: Family Medicine

## 2014-05-04 DIAGNOSIS — M25572 Pain in left ankle and joints of left foot: Secondary | ICD-10-CM

## 2014-05-04 NOTE — Telephone Encounter (Signed)
Please advise 

## 2014-05-04 NOTE — Telephone Encounter (Signed)
Patient states that she would like to proceed with referral to orthopedist

## 2014-05-11 ENCOUNTER — Encounter: Payer: Self-pay | Admitting: *Deleted

## 2014-05-30 ENCOUNTER — Ambulatory Visit (INDEPENDENT_AMBULATORY_CARE_PROVIDER_SITE_OTHER): Payer: BC Managed Care – PPO | Admitting: Family Medicine

## 2014-05-30 ENCOUNTER — Encounter: Payer: Self-pay | Admitting: Family Medicine

## 2014-05-30 VITALS — BP 146/88 | HR 90 | Temp 98.5°F | Ht 62.5 in | Wt 147.8 lb

## 2014-05-30 DIAGNOSIS — I10 Essential (primary) hypertension: Secondary | ICD-10-CM

## 2014-05-30 DIAGNOSIS — M79609 Pain in unspecified limb: Secondary | ICD-10-CM

## 2014-05-30 DIAGNOSIS — IMO0002 Reserved for concepts with insufficient information to code with codable children: Secondary | ICD-10-CM

## 2014-05-30 DIAGNOSIS — E1165 Type 2 diabetes mellitus with hyperglycemia: Secondary | ICD-10-CM

## 2014-05-30 DIAGNOSIS — G2581 Restless legs syndrome: Secondary | ICD-10-CM

## 2014-05-30 DIAGNOSIS — Z23 Encounter for immunization: Secondary | ICD-10-CM

## 2014-05-30 DIAGNOSIS — E118 Type 2 diabetes mellitus with unspecified complications: Secondary | ICD-10-CM

## 2014-05-30 DIAGNOSIS — E785 Hyperlipidemia, unspecified: Secondary | ICD-10-CM

## 2014-05-30 DIAGNOSIS — M545 Low back pain, unspecified: Secondary | ICD-10-CM

## 2014-05-30 DIAGNOSIS — I6359 Cerebral infarction due to unspecified occlusion or stenosis of other cerebral artery: Secondary | ICD-10-CM

## 2014-05-30 DIAGNOSIS — M79642 Pain in left hand: Secondary | ICD-10-CM

## 2014-05-30 DIAGNOSIS — I6350 Cerebral infarction due to unspecified occlusion or stenosis of unspecified cerebral artery: Secondary | ICD-10-CM

## 2014-05-30 NOTE — Patient Instructions (Signed)
Compression hose, light weight 10-20 mmhg knee highs on in am off in pm, Jobst stockings Salon pas or Aspercreme to feet twice a day  Edema Edema is an abnormal buildup of fluids in your bodytissues. Edema is somewhatdependent on gravity to pull the fluid to the lowest place in your body. That makes the condition more common in the legs and thighs (lower extremities). Painless swelling of the feet and ankles is common and becomes more likely as you get older. It is also common in looser tissues, like around your eyes.  When the affected area is squeezed, the fluid may move out of that spot and leave a dent for a few moments. This dent is called pitting.  CAUSES  There are many possible causes of edema. Eating too much salt and being on your feet or sitting for a long time can cause edema in your legs and ankles. Hot weather may make edema worse. Common medical causes of edema include:  Heart failure.  Liver disease.  Kidney disease.  Weak blood vessels in your legs.  Cancer.  An injury.  Pregnancy.  Some medications.  Obesity. SYMPTOMS  Edema is usually painless.Your skin may look swollen or shiny.  DIAGNOSIS  Your health care provider may be able to diagnose edema by asking about your medical history and doing a physical exam. You may need to have tests such as X-rays, an electrocardiogram, or blood tests to check for medical conditions that may cause edema.  TREATMENT  Edema treatment depends on the cause. If you have heart, liver, or kidney disease, you need the treatment appropriate for these conditions. General treatment may include:  Elevation of the affected body part above the level of your heart.  Compression of the affected body part. Pressure from elastic bandages or support stockings squeezes the tissues and forces fluid back into the blood vessels. This keeps fluid from entering the tissues.  Restriction of fluid and salt intake.  Use of a water pill  (diuretic). These medications are appropriate only for some types of edema. They pull fluid out of your body and make you urinate more often. This gets rid of fluid and reduces swelling, but diuretics can have side effects. Only use diuretics as directed by your health care provider. HOME CARE INSTRUCTIONS   Keep the affected body part above the level of your heart when you are lying down.   Do not sit still or stand for prolonged periods.   Do not put anything directly under your knees when lying down.  Do not wear constricting clothing or garters on your upper legs.   Exercise your legs to work the fluid back into your blood vessels. This may help the swelling go down.   Wear elastic bandages or support stockings to reduce ankle swelling as directed by your health care provider.   Eat a low-salt diet to reduce fluid if your health care provider recommends it.   Only take medicines as directed by your health care provider. SEEK MEDICAL CARE IF:   Your edema is not responding to treatment.  You have heart, liver, or kidney disease and notice symptoms of edema.  You have edema in your legs that does not improve after elevating them.   You have sudden and unexplained weight gain. SEEK IMMEDIATE MEDICAL CARE IF:   You develop shortness of breath or chest pain.   You cannot breathe when you lie down.  You develop pain, redness, or warmth in the swollen areas.  You have heart, liver, or kidney disease and suddenly get edema.  You have a fever and your symptoms suddenly get worse. MAKE SURE YOU:   Understand these instructions.  Will watch your condition.  Will get help right away if you are not doing well or get worse. Document Released: 08/25/2005 Document Revised: 01/09/2014 Document Reviewed: 06/17/2013 [facility] Patient Information 2015 [place], [place]. This information is not intended to replace advice given to you by your health care provider. Make sure you  discuss any questions you have with your health care provider.

## 2014-05-30 NOTE — Progress Notes (Signed)
Pre visit review using our clinic review tool, if applicable. No additional management support is needed unless otherwise documented below in the visit note. 

## 2014-06-04 ENCOUNTER — Encounter: Payer: Self-pay | Admitting: Family Medicine

## 2014-06-04 NOTE — Assessment & Plan Note (Signed)
Mild elevation on recheck. no changes to meds. Encouraged heart healthy diet such as the DASH diet and exercise as tolerated.

## 2014-06-04 NOTE — Assessment & Plan Note (Signed)
Improved some with meds but persistent will need  To monitor

## 2014-06-04 NOTE — Progress Notes (Signed)
[name], female   DOB: 06-03-1966, 48 y.o.   MRN: [id number] KINZY WEYERS [id number] [birth year] 06/04/2014      Progress Note-Follow Up  Subjective  Chief Complaint  Chief Complaint  Patient presents with  . Follow-up  . Injections    flu    HPI  Patient is a 48  year old female in today for routine medical care. Patient is in today complaining of persistent pain. Is struggling with back pain and joint pain. 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 Uses oxycodone very infrequently do to sedation usually only takes half a pill at a time but does get some relief. She reports blood sugars and 80-150. Denies polyuria or polydipsia. Does note some intermittent nausea and vomiting. Denies CP/palp/SOB/HA/congestion/fevers or GU c/o. Taking meds as prescribed  Past Medical History  Diagnosis Date  . Hypertension   . Hyperlipidemia 12/06/2010  . Overweight(278.02) 12/06/2010  . NEPHROLITHIASIS, HX OF 10/07/2010  . RESTLESS LEG SYNDROME 10/25/2010  . PERIPHERAL NEUROPATHY, FEET 10/07/2010  . HEART MURMUR, HX OF 10/07/2010  . Essential hypertension, benign 10/07/2010  . DM 10/07/2010  . Disturbance of skin sensation 10/07/2010  . COMMON MIGRAINE 10/07/2010  . Acute bronchitis 07/08/2011  . Diabetes mellitus type II, uncontrolled 10/07/2010    Qualifier: Diagnosis of  By: Charlett Blake MD, [name]    . PVD (peripheral vascular disease) 01/21/2012  . SOB (shortness of breath) 04/01/2012  . Depression 12/18/2012  . Weakness of right side of body 04/19/2013  . CVA (cerebral infarction) 04/19/2013  . Stroke     History reviewed. No pertinent past surgical history.  Family History  Problem Relation Age of Onset  . Arthritis Mother   . Stroke Brother     previous smoker  . Alcohol abuse Brother     in remission  . Leukemia Brother   . Diabetes Paternal Grandmother     History   Social History  . Marital Status: Single     Spouse Name: N/A    Number of Children: N/A  . Years of Education: N/A   Occupational History  . Not on file.   Social History Main Topics  . Smoking status: Former Smoker -- 0.50 packs/day    Quit date: 09/08/2005  . Smokeless tobacco: Never Used  . Alcohol Use: No     Comment: occasional  . Drug Use: No  . Sexual Activity: Yes    Partners: Male   Other Topics Concern  . Not on file   Social History Narrative  . No narrative on file    Current Outpatient Prescriptions on File Prior to Visit  Medication Sig Dispense Refill  . aspirin EC 81 MG tablet Take 1 tablet (81 mg total) by mouth daily.  30 tablet    . glucose blood (FREESTYLE LITE) test strip 1 each by Other route daily. DX- 250.02  Pts machine is Freestyle Freedom Lite  100 each  5  . ibuprofen (ADVIL,MOTRIN) 400 MG tablet Take 1 tablet (400 mg total) by mouth 2 (two) times daily as needed for pain (with food).  60 tablet  2  . Insulin Glargine (LANTUS) 100 UNIT/ML Solostar Pen 50 units SQ daily and increase by 2 unites every 3 days as long as numbers remain above 100  5 pen  5  . Lancets (FREESTYLE) lancets DX- 250.02  Pts machine is Freestyle Freedom Lite  100 each  5  . lisinopril (PRINIVIL,ZESTRIL) 10 MG tablet Take 1 tablet (10 mg total) by mouth 2 (two) times daily.  60 tablet  5  . metoprolol (TOPROL-XL) 25 MG 24 hr tablet Take 25 mg by mouth daily.        . pregabalin (LYRICA) 50 MG capsule 1 tab po tid x 1 week then increase to 2 tabs po tid  180 capsule  2   No current facility-administered medications on file prior to visit.    Allergies  Allergen Reactions  . Morphine And Related Nausea And Vomiting    Review of Systems  Review of Systems  Constitutional: Positive for malaise/fatigue. Negative for fever.  HENT: Negative for congestion.   Eyes: Negative for discharge.  Respiratory: Negative for shortness of breath.   Cardiovascular: Negative for chest pain, palpitations and leg swelling.   Gastrointestinal: Positive for nausea and vomiting. Negative for abdominal pain and diarrhea.  Genitourinary: Negative for dysuria.  Musculoskeletal: Positive for back pain, joint pain and myalgias. Negative for falls.  Skin: Negative for rash.  Neurological: Negative for loss of consciousness and headaches.  Endo/Heme/Allergies: Negative for polydipsia.  Psychiatric/Behavioral: Negative for depression and suicidal ideas. The patient is nervous/anxious. The patient does not have insomnia.     Objective  BP 146/88  Pulse 90  Temp(Src) 98.5 F (36.9 C) (Oral)  Ht 5' 2.5" (1.588 m)  Wt 147 lb 12.8 oz (67.042 kg)  BMI 26.59 kg/m2  SpO2 100%  LMP 02/12/2011  Physical Exam  Physical Exam  Constitutional: She is oriented to person, place, and time and well-developed, well-nourished, and in no distress. No distress.  HENT:  Head: Normocephalic and atraumatic.  Eyes: Conjunctivae are normal.  Neck: Neck supple. No thyromegaly present.  Cardiovascular: Normal rate, regular rhythm and normal heart sounds.   No murmur heard. Pulmonary/Chest: Effort normal and breath sounds normal. She has no wheezes.  Abdominal: She exhibits no distension and no mass.  Musculoskeletal: She exhibits no edema.  Lymphadenopathy:    She has no cervical adenopathy.  Neurological: She is alert and oriented to person, place, and time.  Skin: Skin is warm and dry. No rash noted. She is not diaphoretic.  Psychiatric: Memory, affect and judgment normal.    Lab Results  Component Value Date   TSH 2.562 02/09/2014   Lab Results  Component Value Date   WBC 6.6 02/09/2014   HGB 13.1 02/09/2014   HCT 37.8 02/09/2014   MCV 85.3 02/09/2014   PLT 324 02/09/2014   Lab Results  Component Value Date   CREATININE 0.56 02/09/2014   BUN 11 02/09/2014   NA 137 02/09/2014   K 4.6 02/09/2014   CL 101 02/09/2014   CO2 27 02/09/2014   Lab Results  Component Value Date   ALT 19 02/09/2014   AST 17 02/09/2014   ALKPHOS 94 02/09/2014    BILITOT 0.5 02/09/2014   Lab Results  Component Value Date   CHOL 166 02/09/2014   Lab Results  Component Value Date   HDL 41 02/09/2014   Lab Results  Component Value Date   LDLCALC 114* 02/09/2014   Lab Results  Component Value Date   TRIG 55 02/09/2014   Lab Results  Component Value Date   CHOLHDL 4.0 02/09/2014     Assessment & Plan  Hyperlipidemia Tolerating statin, encouraged heart healthy diet, avoid trans fats, minimize simple carbs and saturated fats. Increase exercise as tolerated  ESSENTIAL HYPERTENSION, BENIGN Mild elevation on recheck. no  changes to meds. Encouraged heart healthy diet such as the DASH diet and exercise as tolerated.   RESTLESS LEG SYNDROME Improved some with meds but persistent will need  To monitor  Back pain And joint pain but continues to work so is unable to take excessive meds. Labs normal. Encouraged topical treatments  CVA (cerebral infarction) No recent episodes

## 2014-06-04 NOTE — Assessment & Plan Note (Signed)
And joint pain but continues to work so is unable to take excessive meds. Labs normal. Encouraged topical treatments

## 2014-06-04 NOTE — Assessment & Plan Note (Signed)
No recent episodes

## 2014-06-04 NOTE — Assessment & Plan Note (Signed)
Tolerating statin, encouraged heart healthy diet, avoid trans fats, minimize simple carbs and saturated fats. Increase exercise as tolerated 

## 2014-06-13 ENCOUNTER — Other Ambulatory Visit: Payer: BC Managed Care – PPO

## 2014-06-13 LAB — CBC
HCT: 37.6 % (ref 36.0–46.0)
Hemoglobin: 12.5 g/dL (ref 12.0–15.0)
MCHC: 33.3 g/dL (ref 30.0–36.0)
MCV: 88.2 fl (ref 78.0–100.0)
Platelets: 345 10*3/uL (ref 150.0–400.0)
RBC: 4.26 Mil/uL (ref 3.87–5.11)
RDW: 13.1 % (ref 11.5–15.5)
WBC: 7.6 10*3/uL (ref 4.0–10.5)

## 2014-06-13 LAB — RENAL FUNCTION PANEL
Albumin: 3.6 g/dL (ref 3.5–5.2)
BUN: 12 mg/dL (ref 6–23)
CO2: 28 mEq/L (ref 19–32)
Calcium: 9.1 mg/dL (ref 8.4–10.5)
Chloride: 101 mEq/L (ref 96–112)
Creatinine, Ser: 0.6 mg/dL (ref 0.4–1.2)
GFR: 114.44 mL/min (ref >60.00)
Glucose, Bld: 291 mg/dL — ABNORMAL HIGH (ref 70–99)
Phosphorus: 3.4 mg/dL (ref 2.3–4.6)
Potassium: 3.8 mEq/L (ref 3.5–5.1)
Sodium: 135 mEq/L (ref 135–145)

## 2014-06-13 LAB — LIPID PANEL
Cholesterol: 173 mg/dL (ref 0–200)
HDL: 39.1 mg/dL (ref >39.00)
LDL Cholesterol: 113 mg/dL — ABNORMAL HIGH (ref 0–99)
NonHDL: 133.9
Total CHOL/HDL Ratio: 4
Triglycerides: 105 mg/dL (ref 0.0–149.0)
VLDL: 21 mg/dL (ref 0.0–40.0)

## 2014-06-13 LAB — HEPATIC FUNCTION PANEL
ALT: 20 U/L (ref 0–35)
AST: 17 U/L (ref 0–37)
Albumin: 3.6 g/dL (ref 3.5–5.2)
Alkaline Phosphatase: 85 U/L (ref 39–117)
Bilirubin, Direct: 0 mg/dL (ref 0.0–0.3)
Total Bilirubin: 0.4 mg/dL (ref 0.2–1.2)
Total Protein: 7.5 g/dL (ref 6.0–8.3)

## 2014-06-13 LAB — HEMOGLOBIN A1C: Hgb A1c MFr Bld: 10.6 % — ABNORMAL HIGH (ref 4.6–6.5)

## 2014-06-13 LAB — SEDIMENTATION RATE: Sed Rate: 32 mm/hr — ABNORMAL HIGH (ref 0–22)

## 2014-06-13 LAB — URIC ACID: Uric Acid, Serum: 3.2 mg/dL (ref 2.4–7.0)

## 2014-06-13 LAB — TSH: TSH: 2.69 u[IU]/mL (ref 0.35–4.50)

## 2014-06-13 LAB — RHEUMATOID FACTOR: Rhuematoid fact SerPl-aCnc: <10 IU/mL (ref <=14)

## 2014-06-14 LAB — ANA: Anti Nuclear Antibody(ANA): NEGATIVE

## 2014-06-29 ENCOUNTER — Encounter: Payer: Self-pay | Admitting: Family Medicine

## 2014-06-29 ENCOUNTER — Ambulatory Visit (INDEPENDENT_AMBULATORY_CARE_PROVIDER_SITE_OTHER): Payer: BC Managed Care – PPO | Admitting: Family Medicine

## 2014-06-29 ENCOUNTER — Encounter (HOSPITAL_BASED_OUTPATIENT_CLINIC_OR_DEPARTMENT_OTHER): Payer: Self-pay

## 2014-06-29 ENCOUNTER — Ambulatory Visit (HOSPITAL_BASED_OUTPATIENT_CLINIC_OR_DEPARTMENT_OTHER)
Admission: RE | Admit: 2014-06-29 | Discharge: 2014-06-29 | Disposition: A | Discharge status: Home / Self Care | Payer: BC Managed Care – PPO | Source: Ambulatory Visit | Attending: Family Medicine | Admitting: Family Medicine

## 2014-06-29 VITALS — BP 140/80 | HR 80 | Temp 98.1°F | Ht 62.5 in | Wt 146.6 lb

## 2014-06-29 DIAGNOSIS — M898X2 Other specified disorders of bone, upper arm: Secondary | ICD-10-CM | POA: Diagnosis present

## 2014-06-29 DIAGNOSIS — M79642 Pain in left hand: Secondary | ICD-10-CM | POA: Insufficient documentation

## 2014-06-29 DIAGNOSIS — E1165 Type 2 diabetes mellitus with hyperglycemia: Secondary | ICD-10-CM

## 2014-06-29 DIAGNOSIS — E1140 Type 2 diabetes mellitus with diabetic neuropathy, unspecified: Secondary | ICD-10-CM

## 2014-06-29 DIAGNOSIS — E1149 Type 2 diabetes mellitus with other diabetic neurological complication: Secondary | ICD-10-CM

## 2014-06-29 DIAGNOSIS — IMO0002 Reserved for concepts with insufficient information to code with codable children: Secondary | ICD-10-CM

## 2014-06-29 DIAGNOSIS — G5791 Unspecified mononeuropathy of right lower limb: Secondary | ICD-10-CM

## 2014-06-29 DIAGNOSIS — I10 Essential (primary) hypertension: Secondary | ICD-10-CM

## 2014-06-29 DIAGNOSIS — I6359 Cerebral infarction due to unspecified occlusion or stenosis of other cerebral artery: Secondary | ICD-10-CM

## 2014-06-29 DIAGNOSIS — L989 Disorder of the skin and subcutaneous tissue, unspecified: Secondary | ICD-10-CM

## 2014-06-29 DIAGNOSIS — G5792 Unspecified mononeuropathy of left lower limb: Secondary | ICD-10-CM

## 2014-06-29 DIAGNOSIS — G5793 Unspecified mononeuropathy of bilateral lower limbs: Secondary | ICD-10-CM

## 2014-06-29 DIAGNOSIS — E785 Hyperlipidemia, unspecified: Secondary | ICD-10-CM

## 2014-06-29 DIAGNOSIS — R700 Elevated erythrocyte sedimentation rate: Secondary | ICD-10-CM

## 2014-06-29 LAB — CBC
HCT: 40.7 % (ref 36.0–46.0)
Hemoglobin: 13.4 g/dL (ref 12.0–15.0)
MCHC: 32.9 g/dL (ref 30.0–36.0)
MCV: 87.3 fl (ref 78.0–100.0)
Platelets: 391 10*3/uL (ref 150.0–400.0)
RBC: 4.67 Mil/uL (ref 3.87–5.11)
RDW: 13.6 % (ref 11.5–15.5)
WBC: 8.5 10*3/uL (ref 4.0–10.5)

## 2014-06-29 LAB — SEDIMENTATION RATE: Sed Rate: 29 mm/hr — ABNORMAL HIGH (ref 0–22)

## 2014-06-29 MED ORDER — SIMVASTATIN 5 MG PO TABS: 5.0000 mg | ORAL_TABLET | Freq: Every day | ORAL | Status: DC

## 2014-06-29 MED ORDER — INSULIN GLARGINE 100 UNIT/ML SOLOSTAR PEN: PEN_INJECTOR | SUBCUTANEOUS | Status: DC

## 2014-06-29 MED ORDER — IOHEXOL 300 MG/ML  SOLN
80.0000 mL | Freq: Once | INTRAMUSCULAR | Status: AC | PRN
  Administered 2014-06-29: 80 mL via INTRAVENOUS

## 2014-06-29 MED ORDER — PREGABALIN 100 MG PO CAPS: 100.0000 mg | ORAL_CAPSULE | Freq: Three times a day (TID) | ORAL | Status: DC

## 2014-06-29 NOTE — Progress Notes (Signed)
[name], female   DOB: 1965/11/29, 48 y.o.   MRN: [id number] [name] [id number] [name], 1967 06/29/2014      Progress Note-Follow Up  Subjective  Chief Complaint  Chief Complaint  Patient presents with  . Follow-up    4 week    HPI  Patient is a 48 year old female in today for routine medical care. She notes an improvement in her sugars. Max of 230 since last visit. Most numbers below 200, low of 120. Decreased polyuria and polydipsia. Continues to struggle with fatigue, malaise and pain. Pain in feet and legs persists although she does believe Lyrica is helping, no fatigue caused by it. Is c/o nodules in left arm causing pain, no warmth or redness. Denies CP/palp/SOB/HA/congestion/fevers/GI or GU c/o. Taking meds as prescribed  Past Medical History  Diagnosis Date  . Hypertension   . Hyperlipidemia 12/06/2010  . Overweight(278.02) 12/06/2010  . NEPHROLITHIASIS, HX OF 10/07/2010  . RESTLESS LEG SYNDROME 10/25/2010  . PERIPHERAL NEUROPATHY, FEET 10/07/2010  . HEART MURMUR, HX OF 10/07/2010  . Essential hypertension, benign 10/07/2010  . DM 10/07/2010  . Disturbance of skin sensation 10/07/2010  . COMMON MIGRAINE 10/07/2010  . Acute bronchitis 07/08/2011  . Diabetes mellitus type II, uncontrolled 10/07/2010    Qualifier: Diagnosis of  By: Charlett Blake MD, [name]    . PVD (peripheral vascular disease) 01/21/2012  . SOB (shortness of breath) 04/01/2012  . Depression 4/Davis/2014  . Weakness of right side of body 8/Davis/2014  . CVA (cerebral infarction) 8/Davis/2014  . Stroke     History reviewed. No pertinent past surgical history.  Family History  Problem Relation Age of Onset  . Arthritis Mother   . Stroke Brother     previous smoker  . Alcohol abuse Brother     in remission  . Leukemia Brother   . Diabetes Paternal Grandmother     History   Social History  . Marital Status: Single    Spouse Name: N/A    Number of Children: N/A  . Years of Education: N/A    Occupational History  . Not on file.   Social History Main Topics  . Smoking status: Former Smoker -- 0.50 packs/day    Quit date: 09/08/2005  . Smokeless tobacco: Never Used  . Alcohol Use: No     Comment: occasional  . Drug Use: No  . Sexual Activity: Yes    Partners: Male   Other Topics Concern  . Not on file   Social History Narrative  . No narrative on file    Current Outpatient Prescriptions on File Prior to Visit  Medication Sig Dispense Refill  . aspirin EC 81 MG tablet Take 1 tablet (81 mg total) by mouth daily.  30 tablet    . glucose blood (FREESTYLE LITE) test strip 1 each by Other route daily. DX- 250.02  Pts machine is Freestyle Freedom Lite  100 each  5  . ibuprofen (ADVIL,MOTRIN) 400 MG tablet Take 1 tablet (400 mg total) by mouth 2 (two) times daily as needed for pain (with food).  60 tablet  2  . Insulin Glargine (LANTUS) 100 UNIT/ML Solostar Pen 50 units SQ daily and increase by 2 unites every 3 days as long as numbers remain above 100  5 pen  5  . Lancets (FREESTYLE) lancets DX- 250.02  Pts machine is Freestyle Freedom Lite  100 each  5  . lisinopril (PRINIVIL,ZESTRIL) 10 MG tablet Take 1 tablet (10 mg total)  by mouth 2 (two) times daily.  60 tablet  5  . metoprolol (TOPROL-XL) 25 MG 24 hr tablet Take 25 mg by mouth daily.        . pregabalin (LYRICA) 50 MG capsule 1 tab po tid x 1 week then increase to 2 tabs po tid  180 capsule  2   No current facility-administered medications on file prior to visit.    Allergies  Allergen Reactions  . Morphine And Related Nausea And Vomiting    Review of Systems  Review of Systems  Constitutional: Positive for malaise/fatigue. Negative for fever.  HENT: Negative for congestion.   Eyes: Negative for discharge.  Respiratory: Negative for shortness of breath.   Cardiovascular: Negative for chest pain, palpitations and leg swelling.  Gastrointestinal: Negative for nausea, abdominal pain and diarrhea.   Genitourinary: Negative for dysuria.  Musculoskeletal: Negative for falls.  Skin: Negative for rash.  Neurological: Positive for tingling. Negative for loss of consciousness and headaches.  Endo/Heme/Allergies: Negative for polydipsia.  Psychiatric/Behavioral: Negative for depression and suicidal ideas. The patient is not nervous/anxious and does not have insomnia.     Objective  BP 152/77  Pulse 80  Temp(Src) 98.1 F (36.7 C) (Oral)  Ht 5' 2.5" (1.588 m)  Wt 146 lb 9.6 oz (66.497 kg)  BMI 26.37 kg/m2  SpO2 100%  LMP 02/12/2011  Physical Exam  Physical Exam  Constitutional: She is oriented to person, place, and time and well-developed, well-nourished, and in no distress. No distress.  HENT:  Head: Normocephalic and atraumatic.  Eyes: Conjunctivae are normal.  Neck: Neck supple. No thyromegaly present.  Cardiovascular: Normal rate, regular rhythm and normal heart sounds.   No murmur heard. Pulmonary/Chest: Effort normal and breath sounds normal. She has no wheezes.  Abdominal: She exhibits no distension and no mass.  Musculoskeletal: She exhibits no edema.  Nodule on upper arm and nodules on palm of hand, tender to palp  Lymphadenopathy:    She has no cervical adenopathy.  Neurological: She is alert and oriented to person, place, and time.  Skin: Skin is warm and dry. No rash noted. She is not diaphoretic.  Psychiatric: Memory, affect and judgment normal.    Lab Results  Component Value Date   TSH 2.69 06/13/2014   Lab Results  Component Value Date   WBC 7.6 06/13/2014   HGB Davis.5 06/13/2014   HCT 37.6 06/13/2014   MCV 88.2 06/13/2014   PLT 345.0 06/13/2014   Lab Results  Component Value Date   CREATININE 0.6 06/13/2014   BUN Davis 06/13/2014   NA 135 06/13/2014   K 3.8 06/13/2014   CL 101 06/13/2014   CO2 28 06/13/2014   Lab Results  Component Value Date   ALT 20 06/13/2014   AST 17 06/13/2014   ALKPHOS 85 06/13/2014   BILITOT 0.4 06/13/2014   Lab Results   Component Value Date   CHOL 173 06/13/2014   Lab Results  Component Value Date   HDL 39.10 06/13/2014   Lab Results  Component Value Date   LDLCALC 113* 06/13/2014   Lab Results  Component Value Date   TRIG 105.0 06/13/2014   Lab Results  Component Value Date   CHOLHDL 4 06/13/2014     Assessment & Plan   Diabetes mellitus type II, uncontrolled A1C improving but still too hi, continues to decline referral. Agrees to continue titrating up her Insulin as tolerated, her numbers lately have been better. Ranging from a hi of 230 to  a low of 120. Most numbers below 200.  Neuropathic pain of both legs Lyrica is helping some and tolerating so will incresae.   CVA (cerebral infarction) No new episodes  Arm lesion Nodules noted in uppper left arm resulting in pain. CT of extremity normal consider rheumatology referral if patient willing, sed rate continues to be elevated  ESSENTIAL HYPERTENSION, BENIGN Well controlled, no changes to meds. Encouraged heart healthy diet such as the DASH diet and exercise as tolerated.   Hyperlipidemia She does agree to start Simvastatin 5 mg daily. Encouraged heart healthy diet, increase exercise, avoid trans fats, consider a krill oil cap daily

## 2014-06-29 NOTE — Progress Notes (Signed)
Pre visit review using our clinic review tool, if applicable. No additional management support is needed unless otherwise documented below in the visit note. 

## 2014-06-29 NOTE — Patient Instructions (Signed)
Call your opthamologist for an appt    Diabetic Retinopathy Diabetic retinopathy is a disease of the light-sensitive membrane at the back of the eye (retina). It is a complication of diabetes and a common cause of blindness. Early detection of the disease is key to keeping your eyes healthy.  CAUSES  Diabetic retinopathy is caused by blood sugar (glucose) levels that are too high over an extended period of time. High blood sugars cause damage to the small blood vessels of the retina, allowing blood to leak through the vessel walls. This causes visual impairment and eventually blindness. RISK FACTORS  High blood pressure.  Having diabetes for a long time.  Having poorly controlled blood sugars. SIGNS AND SYMPTOMS  In the early stages of diabetic retinopathy, there are often no symptoms. As the condition advances, symptoms may include:  Blurred vision. This is usually caused by a swelling due to abnormal blood glucose levels. The blurriness may go away when blood glucose levels return to normal.  Moving specks or dark spots (floaters) in your vision. These can be caused by a small retinal hemorrhage. A hemorrhage is bleeding from blood vessels.  Missing parts of your field of vision, such as things at the side. This can be caused by larger retinal hemorrhages.  Difficulty reading books or signs.  Double vision.  Pain in one or both eyes.  Feeling pressure in one or both eyes.  Trouble seeing straight lines. Straight lines do not look straight.  Redness of the eyes that does not go away. DIAGNOSIS  Your eye care specialist can detect changes in the blood vessels of your eye by putting drops in your eyes that enlarge (dilate) your pupils. This allows your eye care specialist to get a good look at your retina to see if there are any changes that have occurred as a result of your diabetes. You should have your eyes examined once a year. TREATMENT  Your eye care specialist may use a  special laser beam to seal the blood vessels of the retina and stop them from leaking. Early detection and treatment are important so that further damage to your eyes can be prevented. In addition, managing your blood sugars and keeping them in the target range can slow the progress of the disease. HOME CARE INSTRUCTIONS   Keep your blood pressure within your target range.  Keep your blood glucose levels within your target range.  Follow your health care provider's instructions regarding diet and other means for controlling your blood glucose levels.  Check your blood levels for glucose as recommended by your health care provider.  Keep regular appointments with your eye specialist. An eye specialist can usually see diabetic retinopathy developing long before it starts causing problems. In many cases, it can be treated to prevent complications from occurring. If you have diabetes, you should have your eyes checked at least every year. Your risk of retinopathy increases the longer you have the disease.  If you smoke, quit. Ask your health care provider for help if needed. Smoking can make retinopathy worse. SEEK MEDICAL CARE IF:   You notice gradual blurring or other changes in your vision over time.  You notice that your glasses or contact lenses do not make things look as sharp as they once did.  You have trouble reading or seeing details at a distance with either eye.  You notice a sudden change in your vision or notice that parts of your field of vision appear missing or hazy.  You suddenly see moving specks or dark spots in the field of vision of either eye.  You have sudden partial loss of vision in either eye. Document Released: 08/22/2000 Document Revised: 06/15/2013 Document Reviewed: 02/14/2013 [facility] Patient Information 2015 [place], [place]. This information is not intended to replace advice given to you by your health care provider. Make sure you discuss any questions you  have with your health care provider.

## 2014-07-02 DIAGNOSIS — L989 Disorder of the skin and subcutaneous tissue, unspecified: Secondary | ICD-10-CM | POA: Insufficient documentation

## 2014-07-02 NOTE — Assessment & Plan Note (Signed)
Nodules noted in uppper left arm resulting in pain. CT of extremity normal consider rheumatology referral if patient willing, sed rate continues to be elevated

## 2014-07-02 NOTE — Assessment & Plan Note (Addendum)
She does agree to start Simvastatin 5 mg daily. Encouraged heart healthy diet, increase exercise, avoid trans fats, consider a krill oil cap daily

## 2014-07-02 NOTE — Assessment & Plan Note (Signed)
No new episodes

## 2014-07-02 NOTE — Assessment & Plan Note (Signed)
Well controlled, no changes to meds. Encouraged heart healthy diet such as the DASH diet and exercise as tolerated.  °

## 2014-07-02 NOTE — Assessment & Plan Note (Signed)
A1C improving but still too hi, continues to decline referral. Agrees to continue titrating up her Insulin as tolerated, her numbers lately have been better. Ranging from a hi of 230 to a low of 120. Most numbers below 200.

## 2014-07-02 NOTE — Assessment & Plan Note (Signed)
Lyrica is helping some and tolerating so will incresae.

## 2014-07-06 ENCOUNTER — Telehealth: Payer: Self-pay | Admitting: Family Medicine

## 2014-07-06 NOTE — Telephone Encounter (Signed)
Lab results

## 2014-07-06 NOTE — Telephone Encounter (Signed)
Left a detailed message on patients vm 

## 2014-07-06 NOTE — Telephone Encounter (Signed)
.  SWT

## 2014-08-11 ENCOUNTER — Encounter: Payer: Self-pay | Admitting: Family Medicine

## 2014-08-11 ENCOUNTER — Ambulatory Visit (INDEPENDENT_AMBULATORY_CARE_PROVIDER_SITE_OTHER): Payer: BC Managed Care – PPO | Admitting: Family Medicine

## 2014-08-11 VITALS — BP 119/72 | HR 92 | Temp 98.4°F | Ht 62.5 in | Wt 147.0 lb

## 2014-08-11 DIAGNOSIS — I10 Essential (primary) hypertension: Secondary | ICD-10-CM

## 2014-08-11 DIAGNOSIS — IMO0002 Reserved for concepts with insufficient information to code with codable children: Secondary | ICD-10-CM

## 2014-08-11 DIAGNOSIS — E1165 Type 2 diabetes mellitus with hyperglycemia: Secondary | ICD-10-CM

## 2014-08-11 DIAGNOSIS — I6359 Cerebral infarction due to unspecified occlusion or stenosis of other cerebral artery: Secondary | ICD-10-CM

## 2014-08-11 DIAGNOSIS — E785 Hyperlipidemia, unspecified: Secondary | ICD-10-CM

## 2014-08-11 DIAGNOSIS — G609 Hereditary and idiopathic neuropathy, unspecified: Secondary | ICD-10-CM

## 2014-08-11 NOTE — Progress Notes (Signed)
Pre visit review using our clinic review tool, if applicable. No additional management support is needed unless otherwise documented below in the visit note. 

## 2014-08-20 ENCOUNTER — Encounter: Payer: Self-pay | Admitting: Family Medicine

## 2014-08-20 NOTE — Assessment & Plan Note (Signed)
Reminded again how important it is to control sugars to keep this form worsening. For now will titrate up Lyrica as tolerated

## 2014-08-20 NOTE — Assessment & Plan Note (Signed)
No new episodes but does have some mild persistent unilateral weakness. Declines referral to PT

## 2014-08-20 NOTE — Progress Notes (Signed)
[name]  [id number] 04-21-1966 08/20/2014      Progress Note-Follow Up  Subjective  Chief Complaint  Chief Complaint  Patient presents with  . Follow-up    HPI  Patient is a 48 y.o. female in today for routine medical care. In today for follow up. Her peripheral neuropathy is worsening. She does believe the Lyrice has helped slightly without SE. Her sugars are improving. No polyuria or polydipsia. Denies CP/palp/SOB/HA/congestion/fevers/GI or GU c/o. Taking meds as prescribed  Past Medical History  Diagnosis Date  . Hypertension   . Hyperlipidemia 12/06/2010  . Overweight(278.02) 12/06/2010  . NEPHROLITHIASIS, HX OF 10/07/2010  . RESTLESS LEG SYNDROME 10/25/2010  . PERIPHERAL NEUROPATHY, FEET 10/07/2010  . HEART MURMUR, HX OF 10/07/2010  . Essential hypertension, benign 10/07/2010  . DM 10/07/2010  . Disturbance of skin sensation 10/07/2010  . COMMON MIGRAINE 10/07/2010  . Acute bronchitis 07/08/2011  . Diabetes mellitus type II, uncontrolled 10/07/2010    Qualifier: Diagnosis of  By: Charlett Blake MD, [name]    . PVD (peripheral vascular disease) 01/21/2012  . SOB (shortness of breath) 04/01/2012  . Depression 12/18/2012  . Weakness of right side of body 04/19/2013  . CVA (cerebral infarction) 04/19/2013  . Stroke     History reviewed. No pertinent past surgical history.  Family History  Problem Relation Age of Onset  . Arthritis Mother   . Stroke Brother     previous smoker  . Alcohol abuse Brother     in remission  . Leukemia Brother   . Diabetes Paternal Grandmother     History   Social History  . Marital Status: Single    Spouse Name: N/A    Number of Children: N/A  . Years of Education: N/A   Occupational History  . Not on file.   Social History Main Topics  . Smoking status: Former Smoker -- 0.50 packs/day    Quit date: 09/08/2005  . Smokeless tobacco: Never Used  . Alcohol Use: No     Comment: occasional  . Drug Use: No  . Sexual Activity:   Partners: Male   Other Topics Concern  . Not on file   Social History Narrative    Current Outpatient Prescriptions on File Prior to Visit  Medication Sig Dispense Refill  . aspirin EC 81 MG tablet Take 1 tablet (81 mg total) by mouth daily. 30 tablet   . glucose blood (FREESTYLE LITE) test strip 1 each by Other route daily. DX- 250.02  Pts machine is Freestyle Freedom Lite 100 each 5  . ibuprofen (ADVIL,MOTRIN) 400 MG tablet Take 1 tablet (400 mg total) by mouth 2 (two) times daily as needed for pain (with food). 60 tablet 2  . Insulin Glargine (LANTUS) 100 UNIT/ML Solostar Pen 34 units SQ in am and 32 units SQ in pm and increase by 2 unites every 3 days as long as numbers remain above 100 10 pen 5  . Lancets (FREESTYLE) lancets DX- 250.02  Pts machine is Freestyle Freedom Lite 100 each 5  . lisinopril (PRINIVIL,ZESTRIL) 10 MG tablet Take 1 tablet (10 mg total) by mouth 2 (two) times daily. 60 tablet 5  . metoprolol (TOPROL-XL) 25 MG 24 hr tablet Take 25 mg by mouth daily.      . pregabalin (LYRICA) 100 MG capsule Take 1 capsule (100 mg total) by mouth 3 (three) times daily. 90 capsule 3  . simvastatin (ZOCOR) 5 MG tablet Take 1 tablet (5 mg total) by mouth daily.  30 tablet 3   No current facility-administered medications on file prior to visit.    Allergies  Allergen Reactions  . Morphine And Related Nausea And Vomiting    Review of Systems  Review of Systems  Constitutional: Negative for fever and malaise/fatigue.  HENT: Negative for congestion.   Eyes: Negative for discharge.  Respiratory: Negative for shortness of breath.   Cardiovascular: Negative for chest pain, palpitations and leg swelling.  Gastrointestinal: Negative for nausea, abdominal pain and diarrhea.  Genitourinary: Negative for dysuria.  Musculoskeletal: Negative for falls.  Skin: Negative for rash.  Neurological: Negative for loss of consciousness and headaches.  Endo/Heme/Allergies: Negative for  polydipsia.  Psychiatric/Behavioral: Negative for depression and suicidal ideas. The patient is not nervous/anxious and does not have insomnia.     Objective  BP 119/72 mmHg  Pulse 92  Temp(Src) 98.4 F (36.9 C) (Oral)  Ht 5' 2.5" (1.588 m)  Wt 147 lb (66.679 kg)  BMI 26.44 kg/m2  SpO2 100%  LMP 02/12/2011  Physical Exam  Physical Exam  Constitutional: She is oriented to person, place, and time and well-developed, well-nourished, and in no distress. No distress.  HENT:  Head: Normocephalic and atraumatic.  Eyes: Conjunctivae are normal.  Neck: Neck supple. No thyromegaly present.  Cardiovascular: Normal rate, regular rhythm and normal heart sounds.   No murmur heard. Pulmonary/Chest: Effort normal and breath sounds normal. She has no wheezes.  Abdominal: She exhibits no distension and no mass.  Musculoskeletal: She exhibits no edema.  Lymphadenopathy:    She has no cervical adenopathy.  Neurological: She is alert and oriented to person, place, and time.  Skin: Skin is warm and dry. No rash noted. She is not diaphoretic.  Psychiatric: Memory, affect and judgment normal.    Lab Results  Component Value Date   TSH 2.69 06/13/2014   Lab Results  Component Value Date   WBC 8.5 06/29/2014   HGB 13.4 06/29/2014   HCT 40.7 06/29/2014   MCV 87.3 06/29/2014   PLT 391.0 06/29/2014   Lab Results  Component Value Date   CREATININE 0.6 06/13/2014   BUN 12 06/13/2014   NA 135 06/13/2014   K 3.8 06/13/2014   CL 101 06/13/2014   CO2 28 06/13/2014   Lab Results  Component Value Date   ALT 20 06/13/2014   AST 17 06/13/2014   ALKPHOS 85 06/13/2014   BILITOT 0.4 06/13/2014   Lab Results  Component Value Date   CHOL 173 06/13/2014   Lab Results  Component Value Date   HDL 39.10 06/13/2014   Lab Results  Component Value Date   LDLCALC 113* 06/13/2014   Lab Results  Component Value Date   TRIG 105.0 06/13/2014   Lab Results  Component Value Date   CHOLHDL 4  06/13/2014     Assessment & Plan  ESSENTIAL HYPERTENSION, BENIGN Well controlled, no changes to meds. Encouraged heart healthy diet such as the DASH diet and exercise as tolerated.   CVA (cerebral infarction) No new episodes but does have some mild persistent unilateral weakness. Declines referral to PT  Hereditary and idiopathic peripheral neuropathy Reminded again how important it is to control sugars to keep this form worsening. For now will titrate up Lyrica as tolerated  Diabetes mellitus type II, uncontrolled Patient reports improving numbers. Reinforced need to eat small, frequent meals with lean proteins and complex but limited carbs.  Hyperlipidemia Tolerating statin, encouraged heart healthy diet, avoid trans fats, minimize simple carbs and saturated fats.  Increase exercise as tolerated

## 2014-08-20 NOTE — Assessment & Plan Note (Signed)
Patient reports improving numbers. Reinforced need to eat small, frequent meals with lean proteins and complex but limited carbs.

## 2014-08-20 NOTE — Assessment & Plan Note (Signed)
Well controlled, no changes to meds. Encouraged heart healthy diet such as the DASH diet and exercise as tolerated.  °

## 2014-08-20 NOTE — Assessment & Plan Note (Signed)
Tolerating statin, encouraged heart healthy diet, avoid trans fats, minimize simple carbs and saturated fats. Increase exercise as tolerated 

## 2014-09-12 ENCOUNTER — Ambulatory Visit: Payer: BC Managed Care – PPO | Admitting: Family Medicine

## 2014-09-22 ENCOUNTER — Ambulatory Visit: Payer: BC Managed Care – PPO | Admitting: Family Medicine

## 2014-10-02 ENCOUNTER — Ambulatory Visit: Payer: BC Managed Care – PPO | Admitting: Family Medicine

## 2015-01-01 ENCOUNTER — Encounter: Payer: Self-pay | Admitting: Family Medicine

## 2015-01-01 ENCOUNTER — Ambulatory Visit (INDEPENDENT_AMBULATORY_CARE_PROVIDER_SITE_OTHER): Payer: BLUE CROSS/BLUE SHIELD | Admitting: Family Medicine

## 2015-01-01 VITALS — BP 124/82 | HR 81 | Temp 98.1°F | Ht 62.5 in | Wt 141.2 lb

## 2015-01-01 DIAGNOSIS — E1165 Type 2 diabetes mellitus with hyperglycemia: Secondary | ICD-10-CM

## 2015-01-01 DIAGNOSIS — G609 Hereditary and idiopathic neuropathy, unspecified: Secondary | ICD-10-CM | POA: Diagnosis not present

## 2015-01-01 DIAGNOSIS — F329 Major depressive disorder, single episode, unspecified: Secondary | ICD-10-CM

## 2015-01-01 DIAGNOSIS — E782 Mixed hyperlipidemia: Secondary | ICD-10-CM

## 2015-01-01 DIAGNOSIS — E785 Hyperlipidemia, unspecified: Secondary | ICD-10-CM

## 2015-01-01 DIAGNOSIS — I10 Essential (primary) hypertension: Secondary | ICD-10-CM | POA: Diagnosis not present

## 2015-01-01 DIAGNOSIS — I6359 Cerebral infarction due to unspecified occlusion or stenosis of other cerebral artery: Secondary | ICD-10-CM

## 2015-01-01 DIAGNOSIS — I679 Cerebrovascular disease, unspecified: Secondary | ICD-10-CM

## 2015-01-01 DIAGNOSIS — E1140 Type 2 diabetes mellitus with diabetic neuropathy, unspecified: Secondary | ICD-10-CM

## 2015-01-01 DIAGNOSIS — F32A Depression, unspecified: Secondary | ICD-10-CM

## 2015-01-01 DIAGNOSIS — R29898 Other symptoms and signs involving the musculoskeletal system: Secondary | ICD-10-CM

## 2015-01-01 DIAGNOSIS — E1149 Type 2 diabetes mellitus with other diabetic neurological complication: Secondary | ICD-10-CM

## 2015-01-01 DIAGNOSIS — IMO0002 Reserved for concepts with insufficient information to code with codable children: Secondary | ICD-10-CM

## 2015-01-01 DIAGNOSIS — R32 Unspecified urinary incontinence: Secondary | ICD-10-CM

## 2015-01-01 DIAGNOSIS — M6289 Other specified disorders of muscle: Secondary | ICD-10-CM

## 2015-01-01 MED ORDER — INSULIN GLARGINE 100 UNIT/ML SOLOSTAR PEN: PEN_INJECTOR | SUBCUTANEOUS | Status: DC

## 2015-01-01 MED ORDER — SIMVASTATIN 5 MG PO TABS: 5.0000 mg | ORAL_TABLET | Freq: Every day | ORAL | Status: DC

## 2015-01-01 MED ORDER — METOPROLOL SUCCINATE ER 25 MG PO TB24: 25.0000 mg | ORAL_TABLET | Freq: Every day | ORAL | Status: DC

## 2015-01-01 MED ORDER — LISINOPRIL 10 MG PO TABS: 10.0000 mg | ORAL_TABLET | Freq: Two times a day (BID) | ORAL | Status: DC

## 2015-01-01 MED ORDER — ASPIRIN EC 81 MG PO TBEC: 81.0000 mg | DELAYED_RELEASE_TABLET | Freq: Two times a day (BID) | ORAL | Status: DC

## 2015-01-01 NOTE — Patient Instructions (Signed)
Kegel exercises sets of 10 twice daily    Basic Carbohydrate Counting for Diabetes Mellitus Carbohydrate counting is a method for keeping track of the amount of carbohydrates you eat. Eating carbohydrates naturally increases the level of sugar (glucose) in your blood, so it is important for you to know the amount that is okay for you to have in every meal. Carbohydrate counting helps keep the level of glucose in your blood within normal limits. The amount of carbohydrates allowed is different for every person. A dietitian can help you calculate the amount that is right for you. Once you know the amount of carbohydrates you can have, you can count the carbohydrates in the foods you want to eat. Carbohydrates are found in the following foods:  Grains, such as breads and cereals.  Dried beans and soy products.  Starchy vegetables, such as potatoes, peas, and corn.  Fruit and fruit juices.  Milk and yogurt.  Sweets and snack foods, such as cake, cookies, candy, chips, soft drinks, and fruit drinks. CARBOHYDRATE COUNTING There are two ways to count the carbohydrates in your food. You can use either of the methods or a combination of both. Reading the "Nutrition Facts" on [place] The "Nutrition Facts" is an area that is included on the labels of almost all packaged food and beverages in the [place]. It includes the serving size of that food or beverage and information about the nutrients in each serving of the food, including the grams (g) of carbohydrate per serving.  Decide the number of servings of this food or beverage that you will be able to eat or drink. Multiply that number of servings by the number of grams of carbohydrate that is listed on the label for that serving. The total will be the amount of carbohydrates you will be having when you eat or drink this food or beverage. Learning Standard Serving Sizes of Food When you eat food that is not packaged or does not include  "Nutrition Facts" on the label, you need to measure the servings in order to count the amount of carbohydrates.A serving of most carbohydrate-rich foods contains about 15 g of carbohydrates. The following list includes serving sizes of carbohydrate-rich foods that provide 15 g ofcarbohydrate per serving:   1 slice of bread (1 oz) or 1 six-inch tortilla.    of a hamburger bun or English muffin.  4-6 crackers.   cup unsweetened dry cereal.    cup hot cereal.   cup rice or pasta.    cup mashed potatoes or  of a large baked potato.  1 cup fresh fruit or one small piece of fruit.    cup canned or frozen fruit or fruit juice.  1 cup milk.   cup plain fat-free yogurt or yogurt sweetened with artificial sweeteners.   cup cooked dried beans or starchy vegetable, such as peas, corn, or potatoes.  Decide the number of standard-size servings that you will eat. Multiply that number of servings by 15 (the grams of carbohydrates in that serving). For example, if you eat 2 cups of strawberries, you will have eaten 2 servings and 30 g of carbohydrates (2 servings x 15 g = 30 g). For foods such as soups and casseroles, in which more than one food is mixed in, you will need to count the carbohydrates in each food that is included. EXAMPLE OF CARBOHYDRATE COUNTING Sample Dinner  3 oz chicken breast.   cup of brown rice.   cup of corn.  1 cup milk.   1 cup strawberries with sugar-free whipped topping.  Carbohydrate Calculation Step 1: Identify the foods that contain carbohydrates:   Rice.   Corn.   Milk.   Strawberries. Step 2:Calculate the number of servings eaten of each:   2 servings of rice.   1 serving of corn.   1 serving of milk.   1 serving of strawberries. Step 3: Multiply each of those number of servings by 15 g:   2 servings of rice x 15 g = 30 g.   1 serving of corn x 15 g = 15 g.   1 serving of milk x 15 g = 15 g.   1 serving of  strawberries x 15 g = 15 g. Step 4: Add together all of the amounts to find the total grams of carbohydrates eaten: 30 g + 15 g + 15 g + 15 g = 75 g. Document Released: 08/25/2005 Document Revised: 01/09/2014 Document Reviewed: 07/22/2013 [facility] Patient Information 2015 [place], [place]. This information is not intended to replace advice given to you by your health care provider. Make sure you discuss any questions you have with your health care provider.

## 2015-01-01 NOTE — Progress Notes (Signed)
Pre visit review using our clinic review tool, if applicable. No additional management support is needed unless otherwise documented below in the visit note. 

## 2015-01-07 ENCOUNTER — Encounter: Payer: Self-pay | Admitting: Family Medicine

## 2015-01-07 NOTE — Assessment & Plan Note (Addendum)
Symptoms continue to worsen. She did not tolerate increased dose of Lyrica due to sedation. So she decreased it. She did not find it tolerable. Again discussed at length how important it is to control sugar to keep this from worsening. Encouraged to consider cutting back on work but she declines. Is actually doing a much better job with her sugar lately. Will continue lower dose of Lyrica and refer to neurology for evaluation of the extent of her neuropathy

## 2015-01-07 NOTE — Assessment & Plan Note (Signed)
Sugars ranging from 140 to 202 in past 2 weeks. She has done a good job of giving up simple carbs and remembering her meds. Has historically declined endocrinology evaluation and declines again today. Increase Lantus by 2 untis and continue to monitor [id number]

## 2015-01-07 NOTE — Assessment & Plan Note (Signed)
Declines medication and denies suicidal ideation will let us know if she changes her mind

## 2015-01-07 NOTE — Progress Notes (Signed)
[name]  [id number] 05/26/1966 01/07/2015      Progress Note-Follow Up  Subjective  Chief Complaint  Chief Complaint  Patient presents with  . Follow-up    HPI  Patient is a 49 y.o. female in today for routine medical care. Patient is in today for follow-up. Continues to struggle with depression but denies suicidal ideation. Continues to work long hours. Her pain, numbness and weakness in her feet and hands is worsening. She is noting that the fourth and fifth fingers on her right hand are completely numb and that she has a significant drop in her grip strength in her right hand to the point where she has trouble performing simple tasks such as eating at times. She has changed her diet and is eating significantly less carbs. In the last month her lower sugars 140 at her highest is 202. She had an episode yesterday of a strange sensation on the left side of her head and difficulty thinking and talking. They resolve spontaneously. No other acute illness or complaint. Denies CP/palp/SOB/HA/congestion/fevers/GI or GU c/o. Taking meds as prescribed  Past Medical History  Diagnosis Date  . Hypertension   . Hyperlipidemia 12/06/2010  . Overweight(278.02) 12/06/2010  . NEPHROLITHIASIS, HX OF 10/07/2010  . RESTLESS LEG SYNDROME 10/25/2010  . PERIPHERAL NEUROPATHY, FEET 10/07/2010  . HEART MURMUR, HX OF 10/07/2010  . Essential hypertension, benign 10/07/2010  . DM 10/07/2010  . Disturbance of skin sensation 10/07/2010  . COMMON MIGRAINE 10/07/2010  . Acute bronchitis 07/08/2011  . Diabetes mellitus type II, uncontrolled 10/07/2010    Qualifier: Diagnosis of  By: Charlett Blake MD, [name]    . PVD (peripheral vascular disease) 01/21/2012  . SOB (shortness of breath) 04/01/2012  . Depression 12/18/2012  . Weakness of right side of body 04/19/2013  . CVA (cerebral infarction) 04/19/2013  . Stroke     No past surgical history on file.  Family History  Problem Relation Age of Onset  . Arthritis  Mother   . Stroke Brother     previous smoker  . Alcohol abuse Brother     in remission  . Leukemia Brother   . Diabetes Paternal Grandmother     History   Social History  . Marital Status: Single    Spouse Name: N/A  . Number of Children: N/A  . Years of Education: N/A   Occupational History  . Not on file.   Social History Main Topics  . Smoking status: Former Smoker -- 0.50 packs/day    Quit date: 09/08/2005  . Smokeless tobacco: Never Used  . Alcohol Use: No     Comment: occasional  . Drug Use: No  . Sexual Activity:    Partners: Male   Other Topics Concern  . Not on file   Social History Narrative    Current Outpatient Prescriptions on File Prior to Visit  Medication Sig Dispense Refill  . glucose blood (FREESTYLE LITE) test strip 1 each by Other route daily. DX- 250.02  Pts machine is Freestyle Freedom Lite 100 each 5  . Lancets (FREESTYLE) lancets DX- 250.02  Pts machine is Freestyle Freedom Lite 100 each 5  . pregabalin (LYRICA) 100 MG capsule Take 1 capsule (100 mg total) by mouth 3 (three) times daily. 90 capsule 3  . ibuprofen (ADVIL,MOTRIN) 400 MG tablet Take 1 tablet (400 mg total) by mouth 2 (two) times daily as needed for pain (with food). (Patient not taking: Reported on 01/01/2015) 60 tablet 2   No current  facility-administered medications on file prior to visit.    Allergies  Allergen Reactions  . Morphine And Related Nausea And Vomiting    Review of Systems  Review of Systems  Constitutional: Positive for malaise/fatigue. Negative for fever.  HENT: Negative for congestion.   Eyes: Negative for discharge.  Respiratory: Negative for shortness of breath.   Cardiovascular: Negative for chest pain, palpitations and leg swelling.  Gastrointestinal: Negative for nausea, abdominal pain and diarrhea.  Genitourinary: Negative for dysuria.  Musculoskeletal: Positive for myalgias and joint pain. Negative for falls.  Skin: Negative for rash.    Neurological: Positive for tingling, sensory change and focal weakness. Negative for loss of consciousness and headaches.  Endo/Heme/Allergies: Negative for polydipsia.  Psychiatric/Behavioral: Positive for depression. Negative for suicidal ideas. The patient is not nervous/anxious and does not have insomnia.     Objective  BP 124/82 mmHg  Pulse 81  Temp(Src) 98.1 F (36.7 C) (Oral)  Ht 5' 2.5" (1.588 m)  Wt 141 lb 4 oz (64.071 kg)  BMI 25.41 kg/m2  SpO2 97%  LMP 02/12/2011  Physical Exam  Physical Exam  Constitutional: She is oriented to person, place, and time and well-developed, well-nourished, and in no distress. No distress.  HENT:  Head: Normocephalic and atraumatic.  Eyes: Conjunctivae are normal.  Neck: Neck supple. No thyromegaly present.  Cardiovascular: Normal rate, regular rhythm and normal heart sounds.   No murmur heard. Pulmonary/Chest: Effort normal and breath sounds normal. She has no wheezes.  Abdominal: She exhibits no distension and no mass.  Musculoskeletal: She exhibits no edema.  Lymphadenopathy:    She has no cervical adenopathy.  Neurological: She is alert and oriented to person, place, and time.  Decreased sensation on plantar surface of b/l feet.  Skin: Skin is warm and dry. No rash noted. She is not diaphoretic.  Psychiatric: Memory, affect and judgment normal.    Lab Results  Component Value Date   TSH 2.69 06/13/2014   Lab Results  Component Value Date   WBC 8.5 06/29/2014   HGB 13.4 06/29/2014   HCT 40.7 06/29/2014   MCV 87.3 06/29/2014   PLT 391.0 06/29/2014   Lab Results  Component Value Date   CREATININE 0.6 06/13/2014   BUN 12 06/13/2014   NA 135 06/13/2014   K 3.8 06/13/2014   CL 101 06/13/2014   CO2 28 06/13/2014   Lab Results  Component Value Date   ALT 20 06/13/2014   AST 17 06/13/2014   ALKPHOS 85 06/13/2014   BILITOT 0.4 06/13/2014   Lab Results  Component Value Date   CHOL 173 06/13/2014   Lab Results   Component Value Date   HDL 39.10 06/13/2014   Lab Results  Component Value Date   LDLCALC 113* 06/13/2014   Lab Results  Component Value Date   TRIG 105.0 06/13/2014   Lab Results  Component Value Date   CHOLHDL 4 06/13/2014     Assessment & Plan  ESSENTIAL HYPERTENSION, BENIGN Well controlled, no changes to meds. Encouraged heart healthy diet such as the DASH diet and exercise as tolerated.    Hereditary and idiopathic peripheral neuropathy Symptoms continue to worsen. She did not tolerate increased dose of Lyrica due to sedation. So she decreased it. She did not find it tolerable. Again discussed at length how important it is to control sugar to keep this from worsening. Encouraged to consider cutting back on work but she declines. Is actually doing a much better job with her sugar  lately. Will continue lower dose of Lyrica and refer to neurology for evaluation of the extent of her neuropathy   CVA (cerebral infarction) Patient notes she had an episode of difficulty thinking and speaking yesterday but symptoms resolved. Did not check her sugar at that time consider hypoglycemia vs TIA. Will refer to neurology for further evaluation   Hyperlipidemia Tolerating statin, encouraged heart healthy diet, avoid trans fats, minimize simple carbs and saturated fats. Increase exercise as tolerated   Diabetes mellitus type II, uncontrolled Sugars ranging from 140 to 202 in past 2 weeks. She has done a good job of giving up simple carbs and remembering her meds. Has historically declined endocrinology evaluation and declines again today. Increase Lantus by 2 untis and continue to monitor [id number]   Depression Declines medication and denies suicidal ideation will let us know if she changes her mind

## 2015-01-07 NOTE — Assessment & Plan Note (Signed)
Patient notes she had an episode of difficulty thinking and speaking yesterday but symptoms resolved. Did not check her sugar at that time consider hypoglycemia vs TIA. Will refer to neurology for further evaluation

## 2015-01-07 NOTE — Assessment & Plan Note (Signed)
Well controlled, no changes to meds. Encouraged heart healthy diet such as the DASH diet and exercise as tolerated.  °

## 2015-01-07 NOTE — Assessment & Plan Note (Signed)
Tolerating statin, encouraged heart healthy diet, avoid trans fats, minimize simple carbs and saturated fats. Increase exercise as tolerated 

## 2015-01-08 ENCOUNTER — Ambulatory Visit (INDEPENDENT_AMBULATORY_CARE_PROVIDER_SITE_OTHER): Payer: BLUE CROSS/BLUE SHIELD | Admitting: Nurse Practitioner

## 2015-01-08 ENCOUNTER — Encounter: Payer: Self-pay | Admitting: Nurse Practitioner

## 2015-01-08 VITALS — BP 123/81 | HR 82 | Temp 97.9°F | Ht 62.5 in | Wt 144.0 lb

## 2015-01-08 DIAGNOSIS — H6120 Impacted cerumen, unspecified ear: Secondary | ICD-10-CM | POA: Insufficient documentation

## 2015-01-08 DIAGNOSIS — H6123 Impacted cerumen, bilateral: Secondary | ICD-10-CM

## 2015-01-08 NOTE — Progress Notes (Signed)
   Subjective:    [name], female    DOB: 1965/12/28, 49 y.o.   MRN: [id number]  Otalgia  There is pain in the right ear. This is a recurrent problem. The current episode started in the past 7 days. The problem has been unchanged. There has been no fever. The patient is experiencing no pain. Associated symptoms include hearing loss. Pertinent negatives include no ear discharge or headaches. She has tried nothing for the symptoms.      Review of Systems  HENT: Positive for ear pain and hearing loss. Negative for ear discharge.   Neurological: Negative for headaches.       Objective:   Physical Exam  Constitutional: She is oriented to person, place, and time. She appears well-developed and well-nourished. No distress.  HENT:  Head: Normocephalic and atraumatic.  Right Ear: External ear normal.  Left Ear: External ear normal.  Neither TM visible due to ceruminosis  Eyes: Conjunctivae are normal. Right eye exhibits no discharge. Left eye exhibits no discharge.  Cardiovascular: Normal rate.   Pulmonary/Chest: Effort normal.  Neurological: She is alert and oriented to person, place, and time.  Skin: Skin is warm and dry.  Psychiatric: She has a normal mood and affect. Her behavior is normal. Thought content normal.          Assessment & Plan:  1. Ceruminosis, bilateral Warm water irrigation not successful. Lighted currette used to clear remaining waxy debris bilateral. Canal non-traumatic, TM nml. Hearing loss resolved. F/u PRN See pt instructions

## 2015-01-08 NOTE — Patient Instructions (Signed)
To keep ears clear, use syringe to flush ears with 1:1 solution of warm water & hydrogen peroxide 2-3 times weekly when getting into shower.  Cerumen Impaction A cerumen impaction is when the wax in your ear forms a plug. This plug usually causes reduced hearing. Sometimes it also causes an earache or dizziness. Removing a cerumen impaction can be difficult and painful. The wax sticks to the ear canal. The canal is sensitive and bleeds easily. If you try to remove a heavy wax buildup with a cotton tipped swab, you may push it in further. Irrigation with water, suction, and small ear curettes may be used to clear out the wax. If the impaction is fixed to the skin in the ear canal, ear drops may be needed for a few days to loosen the wax. People who build up a lot of wax frequently can use ear wax removal products available in your local drugstore. SEEK MEDICAL CARE IF:  You develop an earache, increased hearing loss, or marked dizziness. Document Released: 10/02/2004 Document Revised: 11/17/2011 Document Reviewed: 11/22/2009 [facility] Patient Information 2014 [place], [place].

## 2015-01-08 NOTE — Progress Notes (Signed)
Pre visit review using our clinic review tool, if applicable. No additional management support is needed unless otherwise documented below in the visit note. 

## 2015-01-24 ENCOUNTER — Ambulatory Visit: Payer: BLUE CROSS/BLUE SHIELD | Admitting: Neurology

## 2015-02-22 ENCOUNTER — Telehealth: Payer: Self-pay | Admitting: *Deleted

## 2015-02-22 NOTE — Telephone Encounter (Signed)
Pt dropped off FMLA form that need to be re-cert for [facility]. Forms filled out and forwarded to Dr. Charlett Blake. JG//CMA

## 2015-02-23 NOTE — Telephone Encounter (Signed)
Completed forms faxed to [facility] successfully. Sent for scanning. JG/CMA

## 2015-03-02 ENCOUNTER — Encounter: Payer: Self-pay | Admitting: Neurology

## 2015-03-02 ENCOUNTER — Ambulatory Visit (INDEPENDENT_AMBULATORY_CARE_PROVIDER_SITE_OTHER): Payer: BLUE CROSS/BLUE SHIELD | Admitting: Neurology

## 2015-03-02 VITALS — BP 138/88 | HR 77 | Ht 62.5 in | Wt 142.1 lb

## 2015-03-02 DIAGNOSIS — E1149 Type 2 diabetes mellitus with other diabetic neurological complication: Secondary | ICD-10-CM

## 2015-03-02 DIAGNOSIS — E1140 Type 2 diabetes mellitus with diabetic neuropathy, unspecified: Secondary | ICD-10-CM

## 2015-03-02 DIAGNOSIS — G5621 Lesion of ulnar nerve, right upper limb: Secondary | ICD-10-CM

## 2015-03-02 DIAGNOSIS — E119 Type 2 diabetes mellitus without complications: Secondary | ICD-10-CM | POA: Diagnosis not present

## 2015-03-02 DIAGNOSIS — Z794 Long term (current) use of insulin: Secondary | ICD-10-CM

## 2015-03-02 DIAGNOSIS — IMO0001 Reserved for inherently not codable concepts without codable children: Secondary | ICD-10-CM

## 2015-03-02 LAB — VITAMIN B12: Vitamin B-12: 579 pg/mL (ref 211–911)

## 2015-03-02 NOTE — Progress Notes (Signed)
[facility] Clinic Note - Initial Visit   Date: 03/02/2015   REET SCHARRER MRN: [id number] DOB: [birth year]   Dear Dr. Charlett Blake:  Thank you for your kind referral of [name] for consultation of right hand paresthesias. Although her history is well known to you, please allow [name] to reiterate it for the purpose of our medical record. The patient was accompanied to the clinic by husband who also provides collateral information.     History of Present Illness: [name] is a 49 y.o. right-handed Caucasian female with hypertension, hyperlipidemia, insulin dependent diabetes mellitus (HbA1c 10.6), former tobacco use, prior left pontine stroke (2014, mild residual right hand weakness) presenting for evaluation of right hand paresthesias.    Starting around 2014, she started having numbness which started in her feet and have slowly progressed to involve her lower ankle region.  There is no burning or tingling sensation. She endorses difficulty with her balance. Her diabetes has always remained relatively uncontrolled. She was started on Lyrica, but developed cognitive side effects with this was stopped.  In the fall of 2015, she noticed numbness of the right last three fingers with mild extension into her medial forearm.  She has associated weaker grip, such as when holding utensils. She denies any neck pain. She has baseline weakness of the right upper and lower extremities and endorses slight worsening with her new onset of hand paresthesias.  She was last seen by Dr. Tomi Likens in August 2014 for follow-up of left pontine lacunar infarct. At that time she was continued on aspirin and statin therapy.   Out-side paper records, electronic medical record, and images have been reviewed where available and summarized as:  MRI brain 04/28/2013: 1. Signal abnormality strongly suggestive of an acute/subacute lacunar infarct in the left brainstem at the lower pons.  No associated mass effect or hemorrhage.  2. Other evidence of chronic small vessel disease; probable chronic lacunar infarct in the left thalamus, and age advanced nonspecific subcortical cerebral white matter signal changes.  Lab Results  Component Value Date   HGBA1C 10.6* 06/13/2014   Lab Results  Component Value Date   TSH 2.69 06/13/2014    Past Medical History  Diagnosis Date  . Hypertension   . Hyperlipidemia 12/06/2010  . Overweight(278.02) 12/06/2010  . NEPHROLITHIASIS, HX OF 10/07/2010  . RESTLESS LEG SYNDROME 10/25/2010  . PERIPHERAL NEUROPATHY, FEET 10/07/2010  . HEART MURMUR, HX OF 10/07/2010  . Essential hypertension, benign 10/07/2010  . DM 10/07/2010  . Disturbance of skin sensation 10/07/2010  . COMMON MIGRAINE 10/07/2010  . Acute bronchitis 07/08/2011  . Diabetes mellitus type II, uncontrolled 10/07/2010    Qualifier: Diagnosis of  By: Charlett Blake MD, [name]    . PVD (peripheral vascular disease) 01/21/2012  . SOB (shortness of breath) 04/01/2012  . Depression 12/18/2012  . Weakness of right side of body 04/19/2013  . CVA (cerebral infarction) 04/19/2013  . Stroke     No past surgical history on file.   Medications:  Current Outpatient Prescriptions on File Prior to Visit  Medication Sig Dispense Refill  . aspirin EC 81 MG tablet Take 1 tablet (81 mg total) by mouth 2 (two) times daily. 30 tablet   . glucose blood (FREESTYLE LITE) test strip 1 each by Other route daily. DX- 250.02  Pts machine is Freestyle Freedom Lite 100 each 5  . ibuprofen (ADVIL,MOTRIN) 400 MG tablet Take 1 tablet (400 mg total) by mouth 2 (two) times daily as needed  for pain (with food). (Patient not taking: Reported on 01/01/2015) 60 tablet 2  . Insulin Glargine (LANTUS) 100 UNIT/ML Solostar Pen 36 units SQ in am and 32 units SQ in pm and increase by 2 unites every 3 days as long as numbers remain above 100 10 pen 6  . Lancets (FREESTYLE) lancets DX- 250.02  Pts machine is Freestyle Freedom Lite  100 each 5  . lisinopril (PRINIVIL,ZESTRIL) 10 MG tablet Take 1 tablet (10 mg total) by mouth 2 (two) times daily. 60 tablet 6  . metoprolol succinate (TOPROL-XL) 25 MG 24 hr tablet Take 1 tablet (25 mg total) by mouth daily. 30 tablet 6  . pregabalin (LYRICA) 100 MG capsule Take 1 capsule (100 mg total) by mouth 3 (three) times daily. 90 capsule 3  . simvastatin (ZOCOR) 5 MG tablet Take 1 tablet (5 mg total) by mouth daily. 30 tablet 6   No current facility-administered medications on file prior to visit.    Allergies:  Allergies  Allergen Reactions  . Morphine And Related Nausea And Vomiting    Family History: Family History  Problem Relation Age of Onset  . Arthritis Mother   . Stroke Brother     previous smoker  . Alcohol abuse Brother     in remission  . Leukemia Brother   . Diabetes Paternal Grandmother     Social History: History   Social History  . Marital Status: Single    Spouse Name: N/A  . Number of Children: N/A  . Years of Education: N/A   Occupational History  . Not on file.   Social History Main Topics  . Smoking status: Former Smoker -- 0.50 packs/day    Quit date: 09/08/2005  . Smokeless tobacco: Never Used  . Alcohol Use: No     Comment: occasional  . Drug Use: No  . Sexual Activity:    Partners: Male   Other Topics Concern  . Not on file   Social History Narrative   Lives with husband in a two story home.  Has one child.  Works as a Glass blower/designer.  Education: high school.    Review of Systems:  CONSTITUTIONAL: No fevers, chills, night sweats, or weight loss.   EYES: No visual changes or eye pain ENT: No hearing changes.  No history of nose bleeds.   RESPIRATORY: No cough, wheezing and shortness of breath.   CARDIOVASCULAR: Negative for chest pain, and palpitations.   GI: Negative for abdominal discomfort, blood in stools or black stools.  No recent change in bowel habits.   GU:  No history of incontinence.   MUSCLOSKELETAL:  +history of joint pain or swelling.  No myalgias.   SKIN: Negative for lesions, rash, and itching.   HEMATOLOGY/ONCOLOGY: Negative for prolonged bleeding, bruising easily, and swollen nodes.  No history of cancer.   ENDOCRINE: Negative for cold or heat intolerance, polydipsia or goiter.   PSYCH:  No depression or anxiety symptoms.   NEURO: As Above.   Vital Signs:  BP 138/88 mmHg  Pulse 77  Ht 5' 2.5" (1.588 m)  Wt 142 lb 1 oz (64.439 kg)  BMI 25.55 kg/m2  SpO2 98%  LMP 02/12/2011   General Medical Exam:   General:  Well appearing, comfortable.   Eyes/ENT: see cranial nerve examination.   Neck: No masses appreciated.  Full range of motion without tenderness.  No carotid bruits. Respiratory:  Clear to auscultation, good air entry bilaterally.   Cardiac:  Regular rate and rhythm,  no murmur.   Extremities:  No deformities, edema, or skin discoloration.  Skin:  No rashes or lesions.  Neurological Exam: MENTAL STATUS including orientation to time, place, person, recent and remote memory, attention span and concentration, language, and fund of knowledge is normal.  Speech is not dysarthric.  CRANIAL NERVES: II:  No visual field defects.  Unremarkable fundi.   III-IV-VI: Pupils equal round and reactive to light.  Normal conjugate, extra-ocular eye movements in all directions of gaze.  No nystagmus.  Subtle right ptosis.   V:  Normal facial sensation.     VII:  Normal facial symmetry and movements.  No pathologic facial reflexes.  VIII:  Normal hearing and vestibular function.   IX-X:  Normal palatal movement.   XI:  Normal shoulder shrug and head rotation.   XII:  Normal tongue strength and range of motion, no deviation or fasciculation.  MOTOR:  No atrophy, fasciculations or abnormal movements.  No pronator drift.  Tone is normal.    Right Upper Extremity:    Left Upper Extremity:    Deltoid  5-/5   Deltoid  5/5   Biceps  5-/5   Biceps  5/5   Triceps  5-/5   Triceps  5/5     Wrist extensors  5-/5   Wrist extensors  5/5   Wrist flexors  5-/5   Wrist flexors  5/5   Finger extensors  5-/5   Finger extensors  5/5   Finger flexors  5-/5   Finger flexors  5/5   Dorsal interossei  4+/5   Dorsal interossei  5/5   Abductor pollicis  5-/5   Abductor pollicis  5/5   Tone (Ashworth scale)  0  Tone (Ashworth scale)  0   Right Lower Extremity:    Left Lower Extremity:    Hip flexors  5-/5   Hip flexors  5/5   Hip extensors  5-/5   Hip extensors  5/5   Knee flexors  5-/5   Knee flexors  5/5   Knee extensors  5-/5   Knee extensors  5/5   Dorsiflexors  5-/5   Dorsiflexors  5/5   Plantarflexors  5-/5   Plantarflexors  5/5   Toe extensors  5-/5   Toe extensors  5/5   Toe flexors  5-/5   Toe flexors  5/5   Tone (Ashworth scale)  0  Tone (Ashworth scale)  0   MSRs:  Right                                                                 Left brachioradialis 3+  brachioradialis 2+  biceps 3+  biceps 2+  triceps 3+  triceps 2+  patellar 3+  patellar 2+  ankle jerk 2+  ankle jerk 1+  Hoffman no  Hoffman no  plantar response down  plantar response down   SENSORY:  pinprick, light touch, vibration, and temperature is reduced to still to the mid coughs bilaterally. Pinprick in the upper extremity is reduced in the right hand over the ulnar distribution. Romberg sign is mildly positive.   COORDINATION/GAIT: Normal finger-to- nose-finger.  Intact rapid alternating movements bilaterally.  Able to rise from a chair without using arms.  Gait narrow based and stable. She  has difficulty performing tandem and stressed gait.    IMPRESSION: 1. Right hand paresthesias, most likely due to ulnar neuropathy, however with diabetic neuropathy affecting the lower extremities progression of this cannot be excluded. I will order electrodiagnostic testing of the right upper extremity to further distinguish whether this represents ulnar neuropathy vs C8 radiculopathy vs diabetic neuropathy.  2.  Large fiber diabetic neuropathy affecting the lower extremities. Given that she has no painful paresthesias, there is no role for neuralgesic medications. Tight glycemic control was strongly encouraged. For completeness I will check vitamin B-12 and copper levels.   3. Left pontine lacunar infarct, likely small vessel disease.  Continue aspirin and statin therapy.   Return to clinic in 2 months.   The duration of this appointment visit was 40 minutes of face-to-face time with the patient.  Greater than 50% of this time was spent in counseling, explanation of diagnosis, planning of further management, and coordination of care.   Thank you for allowing me to participate in patient's care.  If I can answer any additional questions, I would be pleased to do so.    Sincerely,    Donika K. Posey Pronto, DO

## 2015-03-02 NOTE — Patient Instructions (Addendum)
1.  Check blood work 2.  EMG of the right arm  3.  Encouraged to keep sugars under tight control to prevent progression of neuropathy 4.  Return to clinic in 2 months

## 2015-03-04 LAB — COPPER, SERUM: Copper: 134 ug/dL (ref 70–175)

## 2015-03-08 NOTE — Telephone Encounter (Signed)
Caller name: Shirleen Mcfaul Relationship to patient: self Can be reached: [phone] Pharmacy:  Reason for call: Pt states she was on Tuesday and dropped off FMLA papers again. #7 was not completed. She states forms need to be turned in before 03/11/15 or it will be denied. Please follow up with pt and let her know if these were updated and faxed back in.

## 2015-03-09 NOTE — Telephone Encounter (Signed)
Corrected forms faxed successfully to [facility]. JG//CMA

## 2015-03-13 ENCOUNTER — Telehealth: Payer: Self-pay | Admitting: Family Medicine

## 2015-03-13 NOTE — Telephone Encounter (Signed)
I have finished all paperwork I have. Has it been sent? Is it around? If not please contact company ASAP and see what I can do to help. THX

## 2015-03-13 NOTE — Telephone Encounter (Signed)
Caller name: [name] Relationship to patient: Self Can be [place]:  Reason for call:She needs to know if her paperwork has been faxed and if not it needs to be faxed today or they will cut her off

## 2015-03-14 NOTE — Telephone Encounter (Signed)
Per previous phone note, paperwork was faxed successfully on July 1st. I will re-fax today. JG//CMA

## 2015-03-20 ENCOUNTER — Ambulatory Visit (INDEPENDENT_AMBULATORY_CARE_PROVIDER_SITE_OTHER): Payer: BLUE CROSS/BLUE SHIELD | Admitting: Neurology

## 2015-03-20 DIAGNOSIS — E1149 Type 2 diabetes mellitus with other diabetic neurological complication: Secondary | ICD-10-CM

## 2015-03-20 DIAGNOSIS — E1140 Type 2 diabetes mellitus with diabetic neuropathy, unspecified: Secondary | ICD-10-CM

## 2015-03-20 DIAGNOSIS — G5621 Lesion of ulnar nerve, right upper limb: Secondary | ICD-10-CM

## 2015-03-20 DIAGNOSIS — E0842 Diabetes mellitus due to underlying condition with diabetic polyneuropathy: Secondary | ICD-10-CM

## 2015-03-20 DIAGNOSIS — E119 Type 2 diabetes mellitus without complications: Secondary | ICD-10-CM

## 2015-03-20 DIAGNOSIS — Z794 Long term (current) use of insulin: Secondary | ICD-10-CM

## 2015-03-20 DIAGNOSIS — IMO0001 Reserved for inherently not codable concepts without codable children: Secondary | ICD-10-CM

## 2015-03-20 NOTE — Procedures (Signed)
[facility]  [address], [place]  [place],  [address] Tel: [phone] Fax:  [phone] Test Date:  03/20/2015  Patient: [name] DOB: [birth year] Physician: Narda Amber, DO  Sex: Female Height: 5' " Ref Phys: Narda Amber, DO  ID#: [id number] Temp: 34.2C Technician: Jerilynn Mages. Dean   Patient Complaints: This is a 49 year old diabetic female presenting for evaluation of bilateral hand paresthesias and right hand weakness.   NCV & EMG Findings: Extensive electrodiagnostic testing of the right upper extremity and additional studies of the left shows:  1. Nearly all sensory responses in the upper extremity including bilateral median, radial, and right ulnar sensory responses are absent. The left ulnar sensory nerve showed prolonged distal peak latency (4.4 ms) and reduced amplitude (6.4 V). 2. Bilateral ulnar and median motor responses show prolonged latency and conduction velocity slowing across its course (30-44 m/s). There is no conduction block or temporal dispersion. More specifically, findings are as follows: right ulnar motor response is shows prolonged distal onset latency (3.5 ms), reduced asymmetrically amplitude (3.0 mV), decreased conduction velocity (B Elbow-Wrist, 42 m/s), and decreased conduction velocity (A Elbow-B Elbow, 30 m/s). Left ulnar motor response also shows prolonged latency (3.3 ms) and decreased conduction velocity along its course (B Elbow-Wrist, 44 m/s; A Elbow-B Elbow, 37 m/s), with preserved amplitude. Bilateral median motor responses are prolonged (R 4.4 ms, L 4.3 ms) and conduction velocity is slowed in the forearm (R Elbow-Wrist, 43 m/s and L 39 m/s), additionally, the right median motor response is reduced in amplitude (5.0 mV). 3. F Wave studies indicate that the left ulnar F wave has prolonged latency (34.96 ms).  The right ulnar F wave has no response.  4. Chronic motor axon loss changes are seen affecting the right ulnar innervated  muscles and C5 myotome. Sparse chronic motor axonal loss changes are seen affecting bilateral indicis proprius muscles.  There is no evidence of active ongoing denervation.  Impression: 1. The electrophysiologic findings are most consistent with a chronic generalized sensorimotor polyneuropathy, axon loss and demyelinating in type, affecting the upper extremities. Overall, these findings are severe in degree electrically. With the patchy distribution of findings, a polyradiculoneuropathy cannot be excluded. Clinical correlation recommended. 2. A superimposed right ulnar neuropathy proximal to the takeoff to the flexor digitorum profundus 4 &5  muscle is also likely. 3. Chronic C5 radiculopathy affecting the right upper extremity, mild in degree electrically.    ___________________________ Narda Amber, DO    Nerve Conduction Studies Anti Sensory Summary Table   Site NR Peak (ms) Norm Peak (ms) P-T Amp (V) Norm P-T Amp  Left Median Anti Sensory (2nd Digit)  34.2C  Wrist NR  <3.6  >15  Right Median Anti Sensory (2nd Digit)  34.2C  Wrist NR  <3.6  >15  Left Radial Anti Sensory (Base 1st Digit)  34.2C  Wrist NR  <2.7  >14  Right Radial Anti Sensory (Base 1st Digit)  34.2C  Wrist NR  <2.7  >14  Left Ulnar Anti Sensory (5th Digit)  34.2C  Wrist    4.4 <3.1 6.4 >10  Right Ulnar Anti Sensory (5th Digit)  34.2C  Wrist NR  <3.1  >10   Motor Summary Table   Site NR Onset (ms) Norm Onset (ms) O-P Amp (mV) Norm O-P Amp Site1 Site2 Delta-0 (ms) Dist (cm) Vel (m/s) Norm Vel (m/s)  Left Median Motor (Abd Poll Brev)  34.2C  Wrist    4.3 <4.0 6.8 >6 Elbow Wrist 7.0  27.0 39 >50  Elbow    11.3  6.5         Right Median Motor (Abd Poll Brev)  34.2C  Wrist    4.4 <4.0 5.0 >6 Elbow Wrist 5.8 25.0 43 >50  Elbow    10.2  4.0         Left Ulnar Motor (Abd Dig Minimi)  34.2C  Wrist    3.3 <3.1 9.3 >7 B Elbow Wrist 4.8 21.0 44 >50  B Elbow    8.1  7.6  A Elbow B Elbow 2.7 10.0 37 >50  A Elbow     10.8  6.8         Right Ulnar Motor (Abd Dig Minimi)  34.2C  Wrist    3.5 <3.1 3.0 >7 B Elbow Wrist 4.8 20.0 42 >50  B Elbow    8.3  2.7  A Elbow B Elbow 3.3 10.0 30 >50  A Elbow    11.6  1.3          F Wave Studies   NR F-Lat (ms) Lat Norm (ms) L-R F-Lat (ms)  Left Ulnar (Mrkrs) (Abd Dig Min)  34.2C     34.96 <33   Right Ulnar (Mrkrs) (Abd Dig Min)  34.2C  NR  <33    EMG   Side Muscle Ins Act Fibs Psw Fasc Number Recrt Dur Dur. Amp Amp. Poly Poly. Comment  Right 1stDorInt Nml Nml Nml Nml 3- Rapid Most 1+ Nml Nml Some 1+ MMAV  Right ABD Dig Min Nml Nml Nml Nml 3- Rapid Many 1+ Nml Nml Nml Nml N/A  Right Ext Indicis Nml Nml Nml Nml 1- Mod-R Few 1+ Nml Nml Nml Nml N/A  Right FlexPolLong Nml Nml Nml Nml Nml Nml Nml Nml Nml Nml Nml Nml N/A  Right PronatorTeres Nml Nml Nml Nml Nml Nml Nml Nml Nml Nml Nml Nml N/A  Right FlexorDigProf 4&5 Nml Nml Nml Nml 1- Mod-R Few 1+ Few 1+ Nml Nml N/A  Right Biceps Nml Nml Nml Nml 1- Mod-R Few 1+ Few 1+ Nml Nml N/A  Right Triceps Nml Nml Nml Nml Nml Nml Nml Nml Nml Nml Nml Nml N/A  Right CervicalPSP-Low Nml Nml Nml Nml Nml Nml Nml Nml Nml Nml Nml Nml N/A  Right Deltoid Nml Nml Nml Nml 1- Mod-R Few 1+ Few 1+ Nml Nml N/A  Right Infraspinatus Nml Nml Nml Nml 1- Mod-R Few 1+ Few 1+ Nml Nml N/A  Left 1stDorInt Nml Nml Nml Nml 1- Mod-R Few 1+ Few 1+ Nml Nml N/A  Left Abd Poll Brev Nml Nml Nml Nml Nml Nml Nml Nml Nml Nml Nml Nml N/A  Left Biceps Nml Nml Nml Nml Nml Nml Nml Nml Nml Nml Nml Nml N/A  Left Triceps Nml Nml Nml Nml Nml Nml Nml Nml Nml Nml Nml Nml N/A  Left PronatorTeres Nml Nml Nml Nml Nml Nml Nml Nml Nml Nml Nml Nml N/A  Left Ext Indicis Nml Nml Nml Nml 1- Rapid Few 1+ Nml Nml Nml Nml N/A    Waveforms:

## 2015-03-21 ENCOUNTER — Other Ambulatory Visit: Payer: BLUE CROSS/BLUE SHIELD

## 2015-03-21 ENCOUNTER — Encounter: Payer: Self-pay | Admitting: Neurology

## 2015-03-21 ENCOUNTER — Ambulatory Visit (INDEPENDENT_AMBULATORY_CARE_PROVIDER_SITE_OTHER): Payer: BLUE CROSS/BLUE SHIELD | Admitting: Neurology

## 2015-03-21 VITALS — BP 140/80 | HR 73 | Ht 62.5 in | Wt 143.1 lb

## 2015-03-21 DIAGNOSIS — G5621 Lesion of ulnar nerve, right upper limb: Secondary | ICD-10-CM

## 2015-03-21 DIAGNOSIS — E1149 Type 2 diabetes mellitus with other diabetic neurological complication: Secondary | ICD-10-CM

## 2015-03-21 DIAGNOSIS — M6289 Other specified disorders of muscle: Secondary | ICD-10-CM | POA: Diagnosis not present

## 2015-03-21 DIAGNOSIS — I639 Cerebral infarction, unspecified: Secondary | ICD-10-CM

## 2015-03-21 DIAGNOSIS — E0842 Diabetes mellitus due to underlying condition with diabetic polyneuropathy: Secondary | ICD-10-CM

## 2015-03-21 DIAGNOSIS — R29898 Other symptoms and signs involving the musculoskeletal system: Secondary | ICD-10-CM

## 2015-03-21 DIAGNOSIS — G610 Guillain-Barre syndrome: Secondary | ICD-10-CM

## 2015-03-21 DIAGNOSIS — E1140 Type 2 diabetes mellitus with diabetic neuropathy, unspecified: Secondary | ICD-10-CM

## 2015-03-21 DIAGNOSIS — R531 Weakness: Secondary | ICD-10-CM

## 2015-03-21 LAB — C-REACTIVE PROTEIN: CRP: 0.7 mg/dL (ref 0.5–20.0)

## 2015-03-21 LAB — SEDIMENTATION RATE: Sed Rate: 30 mm/hr — ABNORMAL HIGH (ref 0–22)

## 2015-03-21 NOTE — Progress Notes (Signed)
Follow-up Visit   Date: 03/21/2015   [name] MRN: [id number] DOB: [birth year]   Interim History: [name] is a 49 y.o. right-handed Caucasian female with hypertension, hyperlipidemia, insulin dependent diabetes mellitus (HbA1c 10.6), former tobacco use, prior left pontine stroke (2014, mild residual right hand weakness) returning to the clinic for follow-up of right hand paresthesias and weakness.  The patient was accompanied to the clinic by husband who also provides collateral information.    History of present illness: Starting around 2014, she started having numbness which started in her feet and have slowly progressed to involve her lower ankle region. There is no burning or tingling sensation. She endorses difficulty with her balance. Her diabetes has always remained relatively uncontrolled. She was started on Lyrica, but developed cognitive side effects with this was stopped.  In the fall of 2015, she noticed numbness of the right last three fingers with mild extension into her medial forearm. She has associated weaker grip, such as when holding utensils. She denies any neck pain. She has baseline weakness of the right upper and lower extremities and endorses slight worsening with her new onset of hand paresthesias.  She was last seen by Dr. Tomi Likens in August 2014 for follow-up of left pontine lacunar infarct. At that time she was continued on aspirin and statin therapy.  UPDATE 03/21/2015:  Patient had her EMG yesterday and is here to discuss the results.  NCS/EMG shows essentially absent sensory responses in both upper extremities as well as marked demyelinating features involving all the motor nerves testing bilaterally, worse on the right.  She has noticed greater difficultly manipulating small objects on her right hand.  Her numbness remains unchanged in her hands, again worse over the last two fingers on the right, but she has noticed it starting to creep into her  left hand also.  She has had two falls since her last visit because of imbalance and continues to walk independently.  Her husband says that she has a lot of stiffness when she first starts working in the morning, but this improves as the day goes on.  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    Medications:  Current Outpatient Prescriptions on File Prior to Visit  Medication Sig Dispense Refill  . aspirin EC 81 MG tablet Take 1 tablet (81 mg total) by mouth 2 (two) times daily. 30 tablet   . glucose blood (FREESTYLE LITE) test strip 1 each by Other route daily. DX- 250.02  Pts machine is Freestyle Freedom Lite 100 each 5  . ibuprofen (ADVIL,MOTRIN) 400 MG tablet Take 1 tablet (400 mg total) by mouth 2 (two) times daily as needed for pain (with food). 60 tablet 2  . Insulin Glargine (LANTUS) 100 UNIT/ML Solostar Pen 36 units SQ in am and 32 units SQ in pm and increase by 2 unites every 3 days as long as numbers remain above 100 10 pen 6  . Lancets (FREESTYLE) lancets DX- 250.02  Pts machine is Freestyle Freedom Lite 100 each 5  . lisinopril (PRINIVIL,ZESTRIL) 10 MG tablet Take 1 tablet (10 mg total) by mouth 2 (two) times daily. 60 tablet 6  . metoprolol succinate (TOPROL-XL) 25 MG 24 hr tablet Take 1 tablet (25 mg total) by mouth daily. 30 tablet 6  . simvastatin (ZOCOR) 5 MG tablet Take 1 tablet (5 mg total) by mouth daily. 30 tablet 6   No current  facility-administered medications on file prior to visit.    Allergies:  Allergies  Allergen Reactions  . Morphine And Related Nausea And Vomiting    Review of Systems:  CONSTITUTIONAL: No fevers, chills, night sweats, or weight loss.  EYES: No visual changes or eye pain ENT: No hearing changes.  No history of nose bleeds.   RESPIRATORY: No cough, wheezing and shortness of breath.   CARDIOVASCULAR: Negative for chest pain, and palpitations.   GI: Negative for abdominal  discomfort, blood in stools or black stools.  No recent change in bowel habits.   GU:  No history of incontinence.   MUSCLOSKELETAL: No history of joint pain or swelling.  No myalgias.   SKIN: Negative for lesions, rash, and itching.   ENDOCRINE: Negative for cold or heat intolerance, polydipsia or goiter.   PSYCH:  No depression or anxiety symptoms.   NEURO: As Above.   Vital Signs:  BP 140/80 mmHg  Pulse 73  Ht 5' 2.5" (1.588 m)  Wt 143 lb 2 oz (64.921 kg)  BMI 25.74 kg/m2  SpO2 98%  LMP 02/12/2011  Neurological Exam: MENTAL STATUS including orientation to time, place, person, recent and remote memory, attention span and concentration, language, and fund of knowledge is normal.  Speech is not dysarthric.  CRANIAL NERVES:  Pupils equal round and reactive to light.  Normal conjugate, extra-ocular eye movements in all directions of gaze. Mild right ptosis. Normal facial sensation.  Face is symmetric. Palate elevates symmetrically.  Tongue is midline.   MOTOR:  No atrophy, fasciculations or abnormal movements.  No pronator drift.  Tone is normal.    Right Upper Extremity:    Left Upper Extremity:    Deltoid  5-/5   Deltoid  5/5   Biceps  5-/5   Biceps  5/5   Triceps  5-/5   Triceps  5/5   Wrist extensors  5-/5   Wrist extensors  5/5   Wrist flexors  4+/5   Wrist flexors  5/5   Finger extensors  5-/5   Finger extensors  5/5   Finger flexors  4+/5   Finger flexors  5/5   Dorsal interossei  4/5   Dorsal interossei  5-/5   Abductor pollicis  5-/5   Abductor pollicis  5-/5   Tone (Ashworth scale)  0  Tone (Ashworth scale)  0   Right Lower Extremity:    Left Lower Extremity:    Hip flexors  5-/5   Hip flexors  5/5   Hip extensors  5-/5   Hip extensors  5/5   Knee flexors  5-/5   Knee flexors  5/5   Knee extensors  5-/5   Knee extensors  5/5   Dorsiflexors  5-/5   Dorsiflexors  5/5   Plantarflexors  5-/5   Plantarflexors  5/5   Toe extensors  5-/5   Toe extensors  5/5   Toe  flexors  5-/5   Toe flexors  5/5   Tone (Ashworth scale)  0  Tone (Ashworth scale)  0   MSRs:  Right                                                                 Left brachioradialis 3+  brachioradialis 2+  biceps  3+  biceps 2+  triceps 3+  triceps 2+  patellar 3+  patellar 2+  ankle jerk 2+  ankle jerk 1+  Hoffman no  Hoffman no  plantar response down  plantar response down   SENSORY: Pinprick, light touch, vibration, and temperature is reduced the mid calf bilaterally. Pinprick in the upper extremity is reduced in the right hand over the ulnar distribution. Romberg sign is mildly positive.   COORDINATION/GAIT:  Normal finger-to- nose-finger and heel-to-shin.  Intact rapid alternating movements bilaterally.  Gait narrow based and stable. She is very unsteady with tandem gait testing.   Data: Labs 03/02/2015:  Copper 134, vitamin B12 579 Labs 06/13/2014:  TSH 2.69, HbA1c 10.6*   MRI brain 04/28/2013: 1. Signal abnormality strongly suggestive of an acute/subacute lacunar infarct in the left brainstem at the lower pons. No associated mass effect or hemorrhage.  2. Other evidence of chronic small vessel disease; probable chronic lacunar infarct in the left thalamus, and age advanced nonspecific subcortical cerebral white matter signal changes.  EMG upper extremities 03/20/2015: 1. The electrophysiologic findings are most consistent with a chronic generalized sensorimotor polyneuropathy, axon loss and demyelinating in type, affecting the upper extremities. Overall, these findings are severe in degree electrically. With the patchy distribution of findings, a polyradiculoneuropathy cannot be excluded. Clinical correlation recommended. 2. A superimposed right ulnar neuropathy proximal to the takeoff to the flexor digitorum profundus 4 &5 muscle is also likely. 3. Chronic C5 radiculopathy affecting the right upper extremity, mild in degree electrically.  IMPRESSION/PLAN: Bilateral hand  paresthesias and weakness, right >> left  --  ?CIDP vs. severe diabetic neuropathy (HbA1c 10.6) Based on her electrodiagnostic testing, findings are most suggesting of a polyradiculoneuropathy especially with the extensive demyelinating slowing which is present as well as patchy findings.  There are several confounding factors: (1) poorly controlled diabetes which can cause neuropathy, (2) right hand weakness from prior left pontine stroke, (3) generalized brisk reflexes which is not seen in neuropathy but may be due to spinal canal stenosis vs UMN from old pontine stroke.   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  We had a lengthy discussion regarding options for work-up including repeat EMG of the lower extremities to look for degree of involvement, MRI cervical spine, and/or CSF testing. As additional testing of the leg would not change management, we decided to image the cervical spine to look for nerve root enhancement and obtain CSF testing for inflammatory findings and albuminocytological dissociation.  I offered the patient and her husband the opportunity to ask questions and answered them to the best of my ability.  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  The severity of her neuropathy and new right hand weakness is clearly impairing her ability to work and function even at home.   PLAN/RECOMMENDATIONS:  1.  Check ESR, CRP, ENA, ANA 2.  MRI cervical spine wwo contrast 3.  Lumbar puncture - CSF cell count and diff, protein, glucose, routine cultures, fungal cultures, cryptococcus antigen, IgG index, oligoclonal bands, myelin basic protein, flow cytology, ACE, CSF lyme 4.  Occupational therapy going forward 5.  Return to clinic in 4-6  weeks, or sooner as needed   The duration of this appointment visit was 40 minutes of face-to-face time with the  patient.  Greater than 50% of this time was spent in counseling, explanation of diagnosis, planning of further management, and coordination of care.   Thank you for allowing me to participate in patient's care.  If I can answer any additional questions, I would be pleased to do so.    Sincerely,    Donika K. Posey Pronto, DO

## 2015-03-21 NOTE — Patient Instructions (Addendum)
1.  Check blood work 2.  MRI cervical spine wwo contrast 3.  We will arrange lumbar puncture and contact you with the details 4.  We will call you with the results of the testing and plan to see you back in 4-6 weeks, or sooner as needed

## 2015-03-22 LAB — ENA 9 PANEL
Centromere Ab Screen: <1
ENA SM Ab Ser-aCnc: <1
Jo-1 Antibody, IgG: <1
Ribosomal P Protein Ab: <1
SM/RNP: <1
SSA (Ro) (ENA) Antibody, IgG: <1
SSB (La) (ENA) Antibody, IgG: <1
Scleroderma (Scl-70) (ENA) Antibody, IgG: <1
ds DNA Ab: 1 IU/mL

## 2015-03-22 LAB — ANA: Anti Nuclear Antibody(ANA): POSITIVE — AB

## 2015-03-22 LAB — ANTI-NUCLEAR AB-TITER (ANA TITER): ANA Titer 1: NEGATIVE

## 2015-03-28 ENCOUNTER — Inpatient Hospital Stay: Admission: RE | Admit: 2015-03-28 | Payer: BLUE CROSS/BLUE SHIELD | Source: Ambulatory Visit

## 2015-04-06 ENCOUNTER — Ambulatory Visit
Admission: RE | Admit: 2015-04-06 | Discharge: 2015-04-06 | Disposition: A | Discharge status: Home / Self Care | Payer: BLUE CROSS/BLUE SHIELD | Source: Ambulatory Visit | Attending: Neurology | Admitting: Neurology

## 2015-04-06 ENCOUNTER — Ambulatory Visit (INDEPENDENT_AMBULATORY_CARE_PROVIDER_SITE_OTHER): Payer: BLUE CROSS/BLUE SHIELD | Admitting: Family Medicine

## 2015-04-06 ENCOUNTER — Telehealth: Payer: Self-pay | Admitting: Family Medicine

## 2015-04-06 ENCOUNTER — Encounter: Payer: Self-pay | Admitting: Family Medicine

## 2015-04-06 VITALS — BP 117/90 | HR 70 | Temp 97.8°F | Resp 16 | Ht 62.0 in | Wt 142.0 lb

## 2015-04-06 DIAGNOSIS — E1165 Type 2 diabetes mellitus with hyperglycemia: Secondary | ICD-10-CM | POA: Diagnosis not present

## 2015-04-06 DIAGNOSIS — G610 Guillain-Barre syndrome: Secondary | ICD-10-CM

## 2015-04-06 DIAGNOSIS — I10 Essential (primary) hypertension: Secondary | ICD-10-CM | POA: Diagnosis not present

## 2015-04-06 DIAGNOSIS — G5791 Unspecified mononeuropathy of right lower limb: Secondary | ICD-10-CM | POA: Diagnosis not present

## 2015-04-06 DIAGNOSIS — E785 Hyperlipidemia, unspecified: Secondary | ICD-10-CM | POA: Diagnosis not present

## 2015-04-06 DIAGNOSIS — G5793 Unspecified mononeuropathy of bilateral lower limbs: Secondary | ICD-10-CM

## 2015-04-06 DIAGNOSIS — IMO0002 Reserved for concepts with insufficient information to code with codable children: Secondary | ICD-10-CM

## 2015-04-06 DIAGNOSIS — R29898 Other symptoms and signs involving the musculoskeletal system: Secondary | ICD-10-CM

## 2015-04-06 DIAGNOSIS — R7309 Other abnormal glucose: Secondary | ICD-10-CM

## 2015-04-06 DIAGNOSIS — G5792 Unspecified mononeuropathy of left lower limb: Secondary | ICD-10-CM

## 2015-04-06 DIAGNOSIS — E1149 Type 2 diabetes mellitus with other diabetic neurological complication: Secondary | ICD-10-CM

## 2015-04-06 DIAGNOSIS — E1140 Type 2 diabetes mellitus with diabetic neuropathy, unspecified: Secondary | ICD-10-CM

## 2015-04-06 LAB — COMPREHENSIVE METABOLIC PANEL
ALT: 21 U/L (ref 0–35)
AST: 15 U/L (ref 0–37)
Albumin: 4 g/dL (ref 3.5–5.2)
Alkaline Phosphatase: 119 U/L — ABNORMAL HIGH (ref 39–117)
BUN: 13 mg/dL (ref 6–23)
CO2: 30 mEq/L (ref 19–32)
Calcium: 9.6 mg/dL (ref 8.4–10.5)
Chloride: 98 mEq/L (ref 96–112)
Creatinine, Ser: 0.69 mg/dL (ref 0.40–1.20)
GFR: 95.22 mL/min (ref >60.00)
Glucose, Bld: 377 mg/dL — ABNORMAL HIGH (ref 70–99)
Potassium: 4.4 mEq/L (ref 3.5–5.1)
Sodium: 135 mEq/L (ref 135–145)
Total Bilirubin: 0.6 mg/dL (ref 0.2–1.2)
Total Protein: 7.6 g/dL (ref 6.0–8.3)

## 2015-04-06 LAB — CBC
HCT: 41.5 % (ref 36.0–46.0)
Hemoglobin: 13.8 g/dL (ref 12.0–15.0)
MCHC: 33.2 g/dL (ref 30.0–36.0)
MCV: 87.7 fl (ref 78.0–100.0)
Platelets: 316 10*3/uL (ref 150.0–400.0)
RBC: 4.72 Mil/uL (ref 3.87–5.11)
RDW: 13.3 % (ref 11.5–15.5)
WBC: 7.4 10*3/uL (ref 4.0–10.5)

## 2015-04-06 LAB — LIPID PANEL
Cholesterol: 205 mg/dL — ABNORMAL HIGH (ref 0–200)
HDL: 51.1 mg/dL (ref >39.00)
LDL Cholesterol: 134 mg/dL — ABNORMAL HIGH (ref 0–99)
NonHDL: 153.42
Total CHOL/HDL Ratio: 4
Triglycerides: 95 mg/dL (ref 0.0–149.0)
VLDL: 19 mg/dL (ref 0.0–40.0)

## 2015-04-06 LAB — TSH: TSH: 2.12 u[IU]/mL (ref 0.35–4.50)

## 2015-04-06 LAB — HEMOGLOBIN A1C: Hgb A1c MFr Bld: 14.1 % — ABNORMAL HIGH (ref 4.6–6.5)

## 2015-04-06 MED ORDER — GADOBENATE DIMEGLUMINE 529 MG/ML IV SOLN
13.0000 mL | Freq: Once | INTRAVENOUS | Status: AC | PRN
  Administered 2015-04-06: 13 mL via INTRAVENOUS

## 2015-04-06 NOTE — Telephone Encounter (Signed)
Relation to pt: self Call back number: [phone]   Reason for call:  Patient returning your call regarding lab results.

## 2015-04-06 NOTE — Telephone Encounter (Signed)
Called and left message for patient regarding lab results.  Notified patient to call back and let us know if OK for referral

## 2015-04-06 NOTE — Progress Notes (Signed)
[name]  [id number] Dec 21, 1965 04/06/2015      Progress Note-Follow Up  Subjective  Chief Complaint  Chief Complaint  Patient presents with  . Follow-up    no concerns     HPI  Patient is a 49 y.o. female in today for routine medical care. Patient is in today for follow-up. She reports her blood sugars are ranging from 98 and 150. No polyuria or polydipsia.She continues to struggle with weakness secondary to her previous but her greatest complaint of increased weakness and decreased sensation in hands which is making her job difficult scheduled for an MRI and is being followed by neurology.  Denies CP/palp/SOB/HA/congestion/fevers/GI or GU c/o. Taking meds as prescribed  Past Medical History  Diagnosis Date  . Hypertension   . Hyperlipidemia 12/06/2010  . Overweight(278.02) 12/06/2010  . NEPHROLITHIASIS, HX OF 10/07/2010  . RESTLESS LEG SYNDROME 10/25/2010  . PERIPHERAL NEUROPATHY, FEET 10/07/2010  . HEART MURMUR, HX OF 10/07/2010  . Essential hypertension, benign 10/07/2010  . DM 10/07/2010  . Disturbance of skin sensation 10/07/2010  . COMMON MIGRAINE 10/07/2010  . Acute bronchitis 07/08/2011  . Diabetes mellitus type II, uncontrolled 10/07/2010    Qualifier: Diagnosis of  By: Charlett Blake MD, [name]    . PVD (peripheral vascular disease) 01/21/2012  . SOB (shortness of breath) 04/01/2012  . Depression 12/18/2012  . Weakness of right side of body 04/19/2013  . CVA (cerebral infarction) 04/19/2013  . Stroke     Past Surgical History  Procedure Laterality Date  . None      Family History  Problem Relation Age of Onset  . Arthritis Mother   . Stroke Brother     previous smoker  . Alcohol abuse Brother     in remission  . Leukemia Brother   . Diabetes Paternal Grandmother   . Healthy Son     History   Social History  . Marital Status: Single    Spouse Name: N/A  . Number of Children: N/A  . Years of Education: N/A   Occupational History  . Not on file.    Social History Main Topics  . Smoking status: Former Smoker -- 0.50 packs/day    Quit date: 09/08/2005  . Smokeless tobacco: Never Used  . Alcohol Use: No  . Drug Use: No  . Sexual Activity:    Partners: Male   Other Topics Concern  . Not on file   Social History Narrative   Lives with husband in a two story home.  Has one child.     Works as a Glass blower/designer.     Education: high school.    Current Outpatient Prescriptions on File Prior to Visit  Medication Sig Dispense Refill  . aspirin EC 81 MG tablet Take 1 tablet (81 mg total) by mouth 2 (two) times daily. 30 tablet   . glucose blood (FREESTYLE LITE) test strip 1 each by Other route daily. DX- 250.02  Pts machine is Freestyle Freedom Lite 100 each 5  . ibuprofen (ADVIL,MOTRIN) 400 MG tablet Take 1 tablet (400 mg total) by mouth 2 (two) times daily as needed for pain (with food). 60 tablet 2  . Insulin Glargine (LANTUS) 100 UNIT/ML Solostar Pen 36 units SQ in am and 32 units SQ in pm and increase by 2 unites every 3 days as long as numbers remain above 100 10 pen 6  . Lancets (FREESTYLE) lancets DX- 250.02  Pts machine is Freestyle Freedom Lite 100 each 5  .  lisinopril (PRINIVIL,ZESTRIL) 10 MG tablet Take 1 tablet (10 mg total) by mouth 2 (two) times daily. 60 tablet 6  . metoprolol succinate (TOPROL-XL) 25 MG 24 hr tablet Take 1 tablet (25 mg total) by mouth daily. 30 tablet 6  . simvastatin (ZOCOR) 5 MG tablet Take 1 tablet (5 mg total) by mouth daily. 30 tablet 6   No current facility-administered medications on file prior to visit.    Allergies  Allergen Reactions  . Morphine And Related Nausea And Vomiting    Review of Systems  Review of Systems  Constitutional: Positive for malaise/fatigue. Negative for fever.  HENT: Negative for congestion.   Eyes: Negative for discharge.  Respiratory: Negative for shortness of breath.   Cardiovascular: Negative for chest pain, palpitations and leg swelling.   Gastrointestinal: Negative for nausea, abdominal pain and diarrhea.  Genitourinary: Negative for dysuria.  Musculoskeletal: Negative for falls.  Skin: Negative for rash.  Neurological: Positive for sensory change and focal weakness. Negative for loss of consciousness and headaches.  Endo/Heme/Allergies: Negative for polydipsia.  Psychiatric/Behavioral: Negative for depression and suicidal ideas. The patient is nervous/anxious. The patient does not have insomnia.     Objective  BP 117/90 mmHg  Pulse 70  Temp(Src) 97.8 F (36.6 C) (Oral)  Resp 16  Ht 5\' 2"  (1.575 m)  Wt 142 lb (64.411 kg)  BMI 25.97 kg/m2  SpO2 97%  LMP 02/12/2011  Physical Exam  Physical Exam  Constitutional: She is oriented to person, place, and time and well-developed, well-nourished, and in no distress. No distress.  HENT:  Head: Normocephalic and atraumatic.  Eyes: Conjunctivae are normal.  Neck: Neck supple. No thyromegaly present.  Cardiovascular: Normal rate, regular rhythm and normal heart sounds.   No murmur heard. Pulmonary/Chest: Effort normal and breath sounds normal. She has no wheezes.  Abdominal: She exhibits no distension and no mass.  Musculoskeletal: She exhibits no edema.  Lymphadenopathy:    She has no cervical adenopathy.  Neurological: She is alert and oriented to person, place, and time.  Skin: Skin is warm and dry. No rash noted. She is not diaphoretic.  Psychiatric: Memory, affect and judgment normal.    Lab Results  Component Value Date   TSH 2.69 06/13/2014   Lab Results  Component Value Date   WBC 8.5 06/29/2014   HGB 13.4 06/29/2014   HCT 40.7 06/29/2014   MCV 87.3 06/29/2014   PLT 391.0 06/29/2014   Lab Results  Component Value Date   CREATININE 0.6 06/13/2014   BUN 12 06/13/2014   NA 135 06/13/2014   K 3.8 06/13/2014   CL 101 06/13/2014   CO2 28 06/13/2014   Lab Results  Component Value Date   ALT 20 06/13/2014   AST 17 06/13/2014   ALKPHOS 85  06/13/2014   BILITOT 0.4 06/13/2014   Lab Results  Component Value Date   CHOL 173 06/13/2014   Lab Results  Component Value Date   HDL 39.10 06/13/2014   Lab Results  Component Value Date   LDLCALC 113* 06/13/2014   Lab Results  Component Value Date   TRIG 105.0 06/13/2014   Lab Results  Component Value Date   CHOLHDL 4 06/13/2014     Assessment & Plan  Hyperlipidemia Tolerating statin, encouraged heart healthy diet, avoid trans fats, minimize simple carbs and saturated fats. Increase exercise as tolerated  ESSENTIAL HYPERTENSION, BENIGN Well controlled, no changes to meds. Encouraged heart healthy diet such as the DASH diet and exercise as tolerated.  Diabetes mellitus type II, uncontrolled hgba1c unacceptable, minimize simple carbs. Increase exercise as tolerated. Patient continues to work long hours and has trouble controlling her sugars. She agrees to referral to endocrinology at this point. Increase Lantus by 2 units and do not skip meals.  Neuropathic pain of both legs Extreme due to poorly controlled sugars. She is now following with neurology and has an MRI scheduled to evaluate her disease secondary to the severity of her symptoms

## 2015-04-06 NOTE — Patient Instructions (Addendum)

## 2015-04-06 NOTE — Progress Notes (Signed)
Pre visit review using our clinic review tool, if applicable. No additional management support is needed unless otherwise documented below in the visit note. 

## 2015-04-09 NOTE — Telephone Encounter (Signed)
Yes please place referral

## 2015-04-09 NOTE — Telephone Encounter (Signed)
Patient states OK for endocrinology referral (related to elevated A1C in lab notes).   OK to place referral?

## 2015-04-10 ENCOUNTER — Other Ambulatory Visit: Payer: Self-pay | Admitting: *Deleted

## 2015-04-10 DIAGNOSIS — R292 Abnormal reflex: Secondary | ICD-10-CM

## 2015-04-10 DIAGNOSIS — W19XXXD Unspecified fall, subsequent encounter: Secondary | ICD-10-CM

## 2015-04-10 DIAGNOSIS — R202 Paresthesia of skin: Secondary | ICD-10-CM

## 2015-04-10 DIAGNOSIS — Z8673 Personal history of transient ischemic attack (TIA), and cerebral infarction without residual deficits: Secondary | ICD-10-CM

## 2015-04-10 DIAGNOSIS — R29898 Other symptoms and signs involving the musculoskeletal system: Secondary | ICD-10-CM

## 2015-04-10 NOTE — Telephone Encounter (Signed)
Referral placed.

## 2015-04-15 NOTE — Assessment & Plan Note (Signed)
hgba1c unacceptable, minimize simple carbs. Increase exercise as tolerated. Patient continues to work long hours and has trouble controlling her sugars. She agrees to referral to endocrinology at this point. Increase Lantus by 2 units and do not skip meals.

## 2015-04-15 NOTE — Assessment & Plan Note (Signed)
Tolerating statin, encouraged heart healthy diet, avoid trans fats, minimize simple carbs and saturated fats. Increase exercise as tolerated 

## 2015-04-15 NOTE — Assessment & Plan Note (Signed)
Well controlled, no changes to meds. Encouraged heart healthy diet such as the DASH diet and exercise as tolerated.  °

## 2015-04-15 NOTE — Assessment & Plan Note (Signed)
Extreme due to poorly controlled sugars. She is now following with neurology and has an MRI scheduled to evaluate her disease secondary to the severity of her symptoms

## 2015-04-23 ENCOUNTER — Ambulatory Visit
Admission: RE | Admit: 2015-04-23 | Discharge: 2015-04-23 | Disposition: A | Discharge status: Home / Self Care | Payer: BLUE CROSS/BLUE SHIELD | Source: Ambulatory Visit | Attending: Neurology | Admitting: Neurology

## 2015-04-23 DIAGNOSIS — Z8673 Personal history of transient ischemic attack (TIA), and cerebral infarction without residual deficits: Secondary | ICD-10-CM

## 2015-04-23 DIAGNOSIS — W19XXXD Unspecified fall, subsequent encounter: Secondary | ICD-10-CM

## 2015-04-23 DIAGNOSIS — R202 Paresthesia of skin: Secondary | ICD-10-CM

## 2015-04-23 DIAGNOSIS — R292 Abnormal reflex: Secondary | ICD-10-CM

## 2015-04-23 DIAGNOSIS — R29898 Other symptoms and signs involving the musculoskeletal system: Secondary | ICD-10-CM

## 2015-04-23 MED ORDER — GADOBENATE DIMEGLUMINE 529 MG/ML IV SOLN
12.0000 mL | Freq: Once | INTRAVENOUS | Status: AC | PRN
  Administered 2015-04-23: 12 mL via INTRAVENOUS

## 2015-04-24 ENCOUNTER — Other Ambulatory Visit: Payer: Self-pay | Admitting: Family Medicine

## 2015-05-10 ENCOUNTER — Ambulatory Visit: Payer: BLUE CROSS/BLUE SHIELD | Admitting: Neurology

## 2015-05-11 ENCOUNTER — Ambulatory Visit: Payer: BLUE CROSS/BLUE SHIELD | Admitting: Endocrinology

## 2015-05-15 ENCOUNTER — Telehealth: Payer: Self-pay | Admitting: Neurology

## 2015-05-15 NOTE — Telephone Encounter (Signed)
I spoke with [name] and she said that she had spoken with the patient and the patient had wanted to wait.  She said that she will call her back and see if she is ready to schedule.

## 2015-05-15 NOTE — Telephone Encounter (Signed)
Pt called and needs the name of where she needs to go for her spinal tap/[facility]# [phone]

## 2015-05-16 ENCOUNTER — Ambulatory Visit: Payer: BLUE CROSS/BLUE SHIELD | Admitting: Neurology

## 2015-05-18 ENCOUNTER — Encounter: Payer: Self-pay | Admitting: *Deleted

## 2015-05-18 ENCOUNTER — Telehealth: Payer: Self-pay | Admitting: Behavioral Health

## 2015-05-18 NOTE — Telephone Encounter (Signed)
Unable to reach patient at time of Pre-Visit Call.  Left message for patient to return call when available.    

## 2015-05-18 NOTE — Progress Notes (Signed)
No show letter sent for missed appointment on 05/10/2015

## 2015-05-21 ENCOUNTER — Ambulatory Visit (INDEPENDENT_AMBULATORY_CARE_PROVIDER_SITE_OTHER): Payer: BLUE CROSS/BLUE SHIELD | Admitting: Physician Assistant

## 2015-05-21 ENCOUNTER — Telehealth: Payer: Self-pay | Admitting: *Deleted

## 2015-05-21 ENCOUNTER — Encounter: Payer: Self-pay | Admitting: Physician Assistant

## 2015-05-21 VITALS — BP 149/83 | HR 86 | Temp 98.5°F | Resp 16 | Ht 62.0 in | Wt 145.1 lb

## 2015-05-21 DIAGNOSIS — E1165 Type 2 diabetes mellitus with hyperglycemia: Secondary | ICD-10-CM

## 2015-05-21 DIAGNOSIS — Z0000 Encounter for general adult medical examination without abnormal findings: Secondary | ICD-10-CM | POA: Diagnosis not present

## 2015-05-21 DIAGNOSIS — Z7189 Other specified counseling: Secondary | ICD-10-CM | POA: Insufficient documentation

## 2015-05-21 DIAGNOSIS — IMO0002 Reserved for concepts with insufficient information to code with codable children: Secondary | ICD-10-CM

## 2015-05-21 DIAGNOSIS — Z1239 Encounter for other screening for malignant neoplasm of breast: Secondary | ICD-10-CM | POA: Insufficient documentation

## 2015-05-21 DIAGNOSIS — Z299 Encounter for prophylactic measures, unspecified: Secondary | ICD-10-CM

## 2015-05-21 LAB — URINALYSIS, ROUTINE W REFLEX MICROSCOPIC
Bilirubin Urine: NEGATIVE
Ketones, ur: NEGATIVE
Leukocytes, UA: NEGATIVE
Nitrite: NEGATIVE
Specific Gravity, Urine: 1.025 (ref 1.000–1.030)
Total Protein, Urine: NEGATIVE
Urine Glucose: NEGATIVE
Urobilinogen, UA: 0.2 (ref 0.0–1.0)
pH: 5.5 (ref 5.0–8.0)

## 2015-05-21 NOTE — Assessment & Plan Note (Signed)
Order for screening mammogram placed.  

## 2015-05-21 NOTE — Patient Instructions (Addendum)
Please go to the lab for urinalysis. I will call you with your results. Your other lab work was checked at last visit with Dr. Charlett Blake.  Keep titrating your insulin as directed by Dr. Charlett Blake until fasting blood sugar is averaging 80-130 each morning.  Continue all other medications as directed.  Follow-up with Dr. Charlett Blake in November for repeat labs.  Preventive Care for Adults A healthy lifestyle and preventive care can promote health and wellness. Preventive health guidelines for women include the following key practices.  A routine yearly physical is a good way to check with your health care provider about your health and preventive screening. It is a chance to share any concerns and updates on your health and to receive a thorough exam.  Visit your dentist for a routine exam and preventive care every 6 months. Brush your teeth twice a day and floss once a day. Good oral hygiene prevents tooth decay and gum disease.  The frequency of eye exams is based on your age, health, family medical history, use of contact lenses, and other factors. Follow your health care provider's recommendations for frequency of eye exams.  Eat a healthy diet. Foods like vegetables, fruits, whole grains, low-fat dairy products, and lean protein foods contain the nutrients you need without too many calories. Decrease your intake of foods high in solid fats, added sugars, and salt. Eat the right amount of calories for you.Get information about a proper diet from your health care provider, if necessary.  Regular physical exercise is one of the most important things you can do for your health. Most adults should get at least 150 minutes of moderate-intensity exercise (any activity that increases your heart rate and causes you to sweat) each week. In addition, most adults need muscle-strengthening exercises on 2 or more days a week.  Maintain a healthy weight. The body mass index (BMI) is a screening tool to identify possible  weight problems. It provides an estimate of body fat based on height and weight. Your health care provider can find your BMI and can help you achieve or maintain a healthy weight.For adults 20 years and older:  A BMI below 18.5 is considered underweight.  A BMI of 18.5 to 24.9 is normal.  A BMI of 25 to 29.9 is considered overweight.  A BMI of 30 and above is considered obese.  Maintain normal blood lipids and cholesterol levels by exercising and minimizing your intake of saturated fat. Eat a balanced diet with plenty of fruit and vegetables. Blood tests for lipids and cholesterol should begin at age 80 and be repeated every 5 years. If your lipid or cholesterol levels are high, you are over 50, or you are at high risk for heart disease, you may need your cholesterol levels checked more frequently.Ongoing high lipid and cholesterol levels should be treated with medicines if diet and exercise are not working.  If you smoke, find out from your health care provider how to quit. If you do not use tobacco, do not start.  Lung cancer screening is recommended for adults aged 39-80 years who are at high risk for developing lung cancer because of a history of smoking. A yearly low-dose CT scan of the lungs is recommended for people who have at least a 30-pack-year history of smoking and are a current smoker or have quit within the past 15 years. A pack year of smoking is smoking an average of 1 pack of cigarettes a day for 1 year (for example: 1  pack a day for 30 years or 2 packs a day for 15 years). Yearly screening should continue until the smoker has stopped smoking for at least 15 years. Yearly screening should be stopped for people who develop a health problem that would prevent them from having lung cancer treatment.  If you are pregnant, do not drink alcohol. If you are breastfeeding, be very cautious about drinking alcohol. If you are not pregnant and choose to drink alcohol, do not have more than 1  drink per day. One drink is considered to be 12 ounces (355 mL) of beer, 5 ounces (148 mL) of wine, or 1.5 ounces (44 mL) of liquor.  Avoid use of street drugs. Do not share needles with anyone. Ask for help if you need support or instructions about stopping the use of drugs.  High blood pressure causes heart disease and increases the risk of stroke. Your blood pressure should be checked at least every 1 to 2 years. Ongoing high blood pressure should be treated with medicines if weight loss and exercise do not work.  If you are 42-31 years old, ask your health care provider if you should take aspirin to prevent strokes.  Diabetes screening involves taking a blood sample to check your fasting blood sugar level. This should be done once every 3 years, after age 44, if you are within normal weight and without risk factors for diabetes. Testing should be considered at a younger age or be carried out more frequently if you are overweight and have at least 1 risk factor for diabetes.  Breast cancer screening is essential preventive care for women. You should practice "breast self-awareness." This means understanding the normal appearance and feel of your breasts and may include breast self-examination. Any changes detected, no matter how small, should be reported to a health care provider. Women in their 37s and 30s should have a clinical breast exam (CBE) by a health care provider as part of a regular health exam every 1 to 3 years. After age 70, women should have a CBE every year. Starting at age 81, women should consider having a mammogram (breast X-ray test) every year. Women who have a family history of breast cancer should talk to their health care provider about genetic screening. Women at a high risk of breast cancer should talk to their health care providers about having an MRI and a mammogram every year.  Breast cancer gene (BRCA)-related cancer risk assessment is recommended for women who have  family members with BRCA-related cancers. BRCA-related cancers include breast, ovarian, tubal, and peritoneal cancers. Having family members with these cancers may be associated with an increased risk for harmful changes (mutations) in the breast cancer genes BRCA1 and BRCA2. Results of the assessment will determine the need for genetic counseling and BRCA1 and BRCA2 testing.  Routine pelvic exams to screen for cancer are no longer recommended for nonpregnant women who are considered low risk for cancer of the pelvic organs (ovaries, uterus, and vagina) and who do not have symptoms. Ask your health care provider if a screening pelvic exam is right for you.  If you have had past treatment for cervical cancer or a condition that could lead to cancer, you need Pap tests and screening for cancer for at least 20 years after your treatment. If Pap tests have been discontinued, your risk factors (such as having a new sexual partner) need to be reassessed to determine if screening should be resumed. Some women have medical problems that  increase the chance of getting cervical cancer. In these cases, your health care provider may recommend more frequent screening and Pap tests.  The HPV test is an additional test that may be used for cervical cancer screening. The HPV test looks for the virus that can cause the cell changes on the cervix. The cells collected during the Pap test can be tested for HPV. The HPV test could be used to screen women aged 70 years and older, and should be used in women of any age who have unclear Pap test results. After the age of 92, women should have HPV testing at the same frequency as a Pap test.  Colorectal cancer can be detected and often prevented. Most routine colorectal cancer screening begins at the age of 20 years and continues through age 81 years. However, your health care provider may recommend screening at an earlier age if you have risk factors for colon cancer. On a yearly  basis, your health care provider may provide home test kits to check for hidden blood in the stool. Use of a small camera at the end of a tube, to directly examine the colon (sigmoidoscopy or colonoscopy), can detect the earliest forms of colorectal cancer. Talk to your health care provider about this at age 25, when routine screening begins. Direct exam of the colon should be repeated every 5-10 years through age 2 years, unless early forms of pre-cancerous polyps or small growths are found.  People who are at an increased risk for hepatitis B should be screened for this virus. You are considered at high risk for hepatitis B if:  You were born in a country where hepatitis B occurs often. Talk with your health care provider about which countries are considered high risk.  Your parents were born in a high-risk country and you have not received a shot to protect against hepatitis B (hepatitis B vaccine).  You have HIV or AIDS.  You use needles to inject street drugs.  You live with, or have sex with, someone who has hepatitis B.  You get hemodialysis treatment.  You take certain medicines for conditions like cancer, organ transplantation, and autoimmune conditions.  Hepatitis C blood testing is recommended for all people born from [birth year] through 1965 and any individual with known risks for hepatitis C.  Practice safe sex. Use condoms and avoid high-risk sexual practices to reduce the spread of sexually transmitted infections (STIs). STIs include gonorrhea, chlamydia, syphilis, trichomonas, herpes, HPV, and human immunodeficiency virus (HIV). Herpes, HIV, and HPV are viral illnesses that have no cure. They can result in disability, cancer, and death.  You should be screened for sexually transmitted illnesses (STIs) including gonorrhea and chlamydia if:  You are sexually active and are younger than 24 years.  You are older than 24 years and your health care provider tells you that you are at  risk for this type of infection.  Your sexual activity has changed since you were last screened and you are at an increased risk for chlamydia or gonorrhea. Ask your health care provider if you are at risk.  If you are at risk of being infected with HIV, it is recommended that you take a prescription medicine daily to prevent HIV infection. This is called preexposure prophylaxis (PrEP). You are considered at risk if:  You are a heterosexual woman, are sexually active, and are at increased risk for HIV infection.  You take drugs by injection.  You are sexually active with a partner who  has HIV.  Talk with your health care provider about whether you are at high risk of being infected with HIV. If you choose to begin PrEP, you should first be tested for HIV. You should then be tested every 3 months for as long as you are taking PrEP.  Osteoporosis is a disease in which the bones lose minerals and strength with aging. This can result in serious bone fractures or breaks. The risk of osteoporosis can be identified using a bone density scan. Women ages 57 years and over and women at risk for fractures or osteoporosis should discuss screening with their health care providers. Ask your health care provider whether you should take a calcium supplement or vitamin D to reduce the rate of osteoporosis.  Menopause can be associated with physical symptoms and risks. Hormone replacement therapy is available to decrease symptoms and risks. You should talk to your health care provider about whether hormone replacement therapy is right for you.  Use sunscreen. Apply sunscreen liberally and repeatedly throughout the day. You should seek shade when your shadow is shorter than you. Protect yourself by wearing long sleeves, pants, a wide-brimmed hat, and sunglasses year round, whenever you are outdoors.  Once a month, do a whole body skin exam, using a mirror to look at the skin on your back. Tell your health care  provider of new moles, moles that have irregular borders, moles that are larger than a pencil eraser, or moles that have changed in shape or color.  Stay current with required vaccines (immunizations).  Influenza vaccine. All adults should be immunized every year.  Tetanus, diphtheria, and acellular pertussis (Td, Tdap) vaccine. Pregnant women should receive 1 dose of Tdap vaccine during each pregnancy. The dose should be obtained regardless of the length of time since the last dose. Immunization is preferred during the 27th-36th week of gestation. An adult who has not previously received Tdap or who does not know her vaccine status should receive 1 dose of Tdap. This initial dose should be followed by tetanus and diphtheria toxoids (Td) booster doses every 10 years. Adults with an unknown or incomplete history of completing a 3-dose immunization series with Td-containing vaccines should begin or complete a primary immunization series including a Tdap dose. Adults should receive a Td booster every 10 years.  Varicella vaccine. An adult without evidence of immunity to varicella should receive 2 doses or a second dose if she has previously received 1 dose. Pregnant females who do not have evidence of immunity should receive the first dose after pregnancy. This first dose should be obtained before leaving the health care facility. The second dose should be obtained 4-8 weeks after the first dose.  Human papillomavirus (HPV) vaccine. Females aged 13-26 years who have not received the vaccine previously should obtain the 3-dose series. The vaccine is not recommended for use in pregnant females. However, pregnancy testing is not needed before receiving a dose. If a female is found to be pregnant after receiving a dose, no treatment is needed. In that case, the remaining doses should be delayed until after the pregnancy. Immunization is recommended for any person with an immunocompromised condition through the  age of 3 years if she did not get any or all doses earlier. During the 3-dose series, the second dose should be obtained 4-8 weeks after the first dose. The third dose should be obtained 24 weeks after the first dose and 16 weeks after the second dose.  Zoster vaccine. One dose is  recommended for adults aged 3 years or older unless certain conditions are present.  Measles, mumps, and rubella (MMR) vaccine. Adults born before [birth year] generally are considered immune to measles and mumps. Adults born in [birth year] or later should have 1 or more doses of MMR vaccine unless there is a contraindication to the vaccine or there is laboratory evidence of immunity to each of the three diseases. A routine second dose of MMR vaccine should be obtained at least 28 days after the first dose for students attending postsecondary schools, health care workers, or international travelers. People who received inactivated measles vaccine or an unknown type of measles vaccine during 1963-1967 should receive 2 doses of MMR vaccine. People who received inactivated mumps vaccine or an unknown type of mumps vaccine before 1979 and are at high risk for mumps infection should consider immunization with 2 doses of MMR vaccine. For females of childbearing age, rubella immunity should be determined. If there is no evidence of immunity, females who are not pregnant should be vaccinated. If there is no evidence of immunity, females who are pregnant should delay immunization until after pregnancy. Unvaccinated health care workers born before [birth year] who lack laboratory evidence of measles, mumps, or rubella immunity or laboratory confirmation of disease should consider measles and mumps immunization with 2 doses of MMR vaccine or rubella immunization with 1 dose of MMR vaccine.  Pneumococcal 13-valent conjugate (PCV13) vaccine. When indicated, a person who is uncertain of her immunization history and has no record of immunization should receive the  PCV13 vaccine. An adult aged 55 years or older who has certain medical conditions and has not been previously immunized should receive 1 dose of PCV13 vaccine. This PCV13 should be followed with a dose of pneumococcal polysaccharide (PPSV23) vaccine. The PPSV23 vaccine dose should be obtained at least 8 weeks after the dose of PCV13 vaccine. An adult aged 58 years or older who has certain medical conditions and previously received 1 or more doses of PPSV23 vaccine should receive 1 dose of PCV13. The PCV13 vaccine dose should be obtained 1 or more years after the last PPSV23 vaccine dose.  Pneumococcal polysaccharide (PPSV23) vaccine. When PCV13 is also indicated, PCV13 should be obtained first. All adults aged 78 years and older should be immunized. An adult younger than age 50 years who has certain medical conditions should be immunized. Any person who resides in a nursing home or long-term care facility should be immunized. An adult smoker should be immunized. People with an immunocompromised condition and certain other conditions should receive both PCV13 and PPSV23 vaccines. People with human immunodeficiency virus (HIV) infection should be immunized as soon as possible after diagnosis. Immunization during chemotherapy or radiation therapy should be avoided. Routine use of PPSV23 vaccine is not recommended for American Indians, [place] Natives, or people younger than 65 years unless there are medical conditions that require PPSV23 vaccine. When indicated, people who have unknown immunization and have no record of immunization should receive PPSV23 vaccine. One-time revaccination 5 years after the first dose of PPSV23 is recommended for people aged 19-64 years who have chronic kidney failure, nephrotic syndrome, asplenia, or immunocompromised conditions. People who received 1-2 doses of PPSV23 before age 15 years should receive another dose of PPSV23 vaccine at age 72 years or later if at least 5 years have  passed since the previous dose. Doses of PPSV23 are not needed for people immunized with PPSV23 at or after age 52 years.  Meningococcal vaccine. Adults with asplenia or  persistent complement component deficiencies should receive 2 doses of quadrivalent meningococcal conjugate (MenACWY-D) vaccine. The doses should be obtained at least 2 months apart. Microbiologists working with certain meningococcal bacteria, [employer] recruits, people at risk during an outbreak, and people who travel to or live in countries with a high rate of meningitis should be immunized. A first-year college student up through age 75 years who is living in a residence hall should receive a dose if she did not receive a dose on or after her 16th birthday. Adults who have certain high-risk conditions should receive one or more doses of vaccine.  Hepatitis A vaccine. Adults who wish to be protected from this disease, have certain high-risk conditions, work with hepatitis A-infected animals, work in hepatitis A research labs, or travel to or work in countries with a high rate of hepatitis A should be immunized. Adults who were previously unvaccinated and who anticipate close contact with an international adoptee during the first 60 days after arrival in the [place] from a country with a high rate of hepatitis A should be immunized.  Hepatitis B vaccine. Adults who wish to be protected from this disease, have certain high-risk conditions, may be exposed to blood or other infectious body fluids, are household contacts or sex partners of hepatitis B positive people, are clients or workers in certain care facilities, or travel to or work in countries with a high rate of hepatitis B should be immunized.  Haemophilus influenzae type b (Hib) vaccine. A previously unvaccinated person with asplenia or sickle cell disease or having a scheduled splenectomy should receive 1 dose of Hib vaccine. Regardless of previous immunization, a recipient of  a hematopoietic stem cell transplant should receive a 3-dose series 6-12 months after her successful transplant. Hib vaccine is not recommended for adults with HIV infection. Preventive Services / Frequency Ages 67 to 77 years  Blood pressure check.** / Every 1 to 2 years.  Lipid and cholesterol check.** / Every 5 years beginning at age 27.  Clinical breast exam.** / Every 3 years for women in their 35s and 35s.  BRCA-related cancer risk assessment.** / For women who have family members with a BRCA-related cancer (breast, ovarian, tubal, or peritoneal cancers).  Pap test.** / Every 2 years from ages 57 through 31. Every 3 years starting at age 59 through age 59 or 51 with a history of 3 consecutive normal Pap tests.  HPV screening.** / Every 3 years from ages 69 through ages 24 to 36 with a history of 3 consecutive normal Pap tests.  Hepatitis C blood test.** / For any individual with known risks for hepatitis C.  Skin self-exam. / Monthly.  Influenza vaccine. / Every year.  Tetanus, diphtheria, and acellular pertussis (Tdap, Td) vaccine.** / Consult your health care provider. Pregnant women should receive 1 dose of Tdap vaccine during each pregnancy. 1 dose of Td every 10 years.  Varicella vaccine.** / Consult your health care provider. Pregnant females who do not have evidence of immunity should receive the first dose after pregnancy.  HPV vaccine. / 3 doses over 6 months, if 26 and younger. The vaccine is not recommended for use in pregnant females. However, pregnancy testing is not needed before receiving a dose.  Measles, mumps, rubella (MMR) vaccine.** / You need at least 1 dose of MMR if you were born in 1957 or later. You may also need a 2nd dose. For females of childbearing age, rubella immunity should be determined. If there is no  evidence of immunity, females who are not pregnant should be vaccinated. If there is no evidence of immunity, females who are pregnant should delay  immunization until after pregnancy.  Pneumococcal 13-valent conjugate (PCV13) vaccine.** / Consult your health care provider.  Pneumococcal polysaccharide (PPSV23) vaccine.** / 1 to 2 doses if you smoke cigarettes or if you have certain conditions.  Meningococcal vaccine.** / 1 dose if you are age 37 to 43 years and a Market researcher living in a residence hall, or have one of several medical conditions, you need to get vaccinated against meningococcal disease. You may also need additional booster doses.  Hepatitis A vaccine.** / Consult your health care provider.  Hepatitis B vaccine.** / Consult your health care provider.  Haemophilus influenzae type b (Hib) vaccine.** / Consult your health care provider. Ages 81 to 14 years  Blood pressure check.** / Every 1 to 2 years.  Lipid and cholesterol check.** / Every 5 years beginning at age 61 years.  Lung cancer screening. / Every year if you are aged 49-80 years and have a 30-pack-year history of smoking and currently smoke or have quit within the past 15 years. Yearly screening is stopped once you have quit smoking for at least 15 years or develop a health problem that would prevent you from having lung cancer treatment.  Clinical breast exam.** / Every year after age 93 years.  BRCA-related cancer risk assessment.** / For women who have family members with a BRCA-related cancer (breast, ovarian, tubal, or peritoneal cancers).  Mammogram.** / Every year beginning at age 80 years and continuing for as long as you are in good health. Consult with your health care provider.  Pap test.** / Every 3 years starting at age 52 years through age 37 or 40 years with a history of 3 consecutive normal Pap tests.  HPV screening.** / Every 3 years from ages 78 years through ages 7 to 14 years with a history of 3 consecutive normal Pap tests.  Fecal occult blood test (FOBT) of stool. / Every year beginning at age 43 years and continuing  until age 35 years. You may not need to do this test if you get a colonoscopy every 10 years.  Flexible sigmoidoscopy or colonoscopy.** / Every 5 years for a flexible sigmoidoscopy or every 10 years for a colonoscopy beginning at age 34 years and continuing until age 25 years.  Hepatitis C blood test.** / For all people born from [birth year] through 1967 and any individual with known risks for hepatitis C.  Skin self-exam. / Monthly.  Influenza vaccine. / Every year.  Tetanus, diphtheria, and acellular pertussis (Tdap/Td) vaccine.** / Consult your health care provider. Pregnant women should receive 1 dose of Tdap vaccine during each pregnancy. 1 dose of Td every 10 years.  Varicella vaccine.** / Consult your health care provider. Pregnant females who do not have evidence of immunity should receive the first dose after pregnancy.  Zoster vaccine.** / 1 dose for adults aged 15 years or older.  Measles, mumps, rubella (MMR) vaccine.** / You need at least 1 dose of MMR if you were born in 1957 or later. You may also need a 2nd dose. For females of childbearing age, rubella immunity should be determined. If there is no evidence of immunity, females who are not pregnant should be vaccinated. If there is no evidence of immunity, females who are pregnant should delay immunization until after pregnancy.  Pneumococcal 13-valent conjugate (PCV13) vaccine.** / Consult your health care provider.  Pneumococcal polysaccharide (PPSV23) vaccine.** / 1 to 2 doses if you smoke cigarettes or if you have certain conditions.  Meningococcal vaccine.** / Consult your health care provider.  Hepatitis A vaccine.** / Consult your health care provider.  Hepatitis B vaccine.** / Consult your health care provider.  Haemophilus influenzae type b (Hib) vaccine.** / Consult your health care provider. Ages 68 years and over  Blood pressure check.** / Every 1 to 2 years.  Lipid and cholesterol check.** / Every 5 years  beginning at age 39 years.  Lung cancer screening. / Every year if you are aged 8-80 years and have a 30-pack-year history of smoking and currently smoke or have quit within the past 15 years. Yearly screening is stopped once you have quit smoking for at least 15 years or develop a health problem that would prevent you from having lung cancer treatment.  Clinical breast exam.** / Every year after age 64 years.  BRCA-related cancer risk assessment.** / For women who have family members with a BRCA-related cancer (breast, ovarian, tubal, or peritoneal cancers).  Mammogram.** / Every year beginning at age 32 years and continuing for as long as you are in good health. Consult with your health care provider.  Pap test.** / Every 3 years starting at age 69 years through age 6 or 55 years with 3 consecutive normal Pap tests. Testing can be stopped between 65 and 70 years with 3 consecutive normal Pap tests and no abnormal Pap or HPV tests in the past 10 years.  HPV screening.** / Every 3 years from ages 19 years through ages 16 or 52 years with a history of 3 consecutive normal Pap tests. Testing can be stopped between 65 and 70 years with 3 consecutive normal Pap tests and no abnormal Pap or HPV tests in the past 10 years.  Fecal occult blood test (FOBT) of stool. / Every year beginning at age 58 years and continuing until age 20 years. You may not need to do this test if you get a colonoscopy every 10 years.  Flexible sigmoidoscopy or colonoscopy.** / Every 5 years for a flexible sigmoidoscopy or every 10 years for a colonoscopy beginning at age 34 years and continuing until age 71 years.  Hepatitis C blood test.** / For all people born from [birth year] through 1967 and any individual with known risks for hepatitis C.  Osteoporosis screening.** / A one-time screening for women ages 23 years and over and women at risk for fractures or osteoporosis.  Skin self-exam. / Monthly.  Influenza vaccine. /  Every year.  Tetanus, diphtheria, and acellular pertussis (Tdap/Td) vaccine.** / 1 dose of Td every 10 years.  Varicella vaccine.** / Consult your health care provider.  Zoster vaccine.** / 1 dose for adults aged 16 years or older.  Pneumococcal 13-valent conjugate (PCV13) vaccine.** / Consult your health care provider.  Pneumococcal polysaccharide (PPSV23) vaccine.** / 1 dose for all adults aged 52 years and older.  Meningococcal vaccine.** / Consult your health care provider.  Hepatitis A vaccine.** / Consult your health care provider.  Hepatitis B vaccine.** / Consult your health care provider.  Haemophilus influenzae type b (Hib) vaccine.** / Consult your health care provider. ** Family history and personal history of risk and conditions may change your health care provider's recommendations. Document Released: 10/21/2001 Document Revised: 01/09/2014 Document Reviewed: 01/20/2011 [facility] Patient Information 2015 [place], [place]. This information is not intended to replace advice given to you by your health care provider. Make sure you discuss  any questions you have with your health care provider.

## 2015-05-21 NOTE — Assessment & Plan Note (Signed)
Reported CBGs still above goal (200s) Continue titration of Lantus as directed by Dr. Charlett Blake. Follow-up with her as scheduled for diabetic foot exam and repeat A1C as too soon to draw today.

## 2015-05-21 NOTE — Telephone Encounter (Signed)
Called patient to inform her that we need her to come into Office at her convenience to sign Cologuard form, pt understood & agreed, gave business hours/SLS

## 2015-05-21 NOTE — Progress Notes (Signed)
Pre visit review using our clinic review tool, if applicable. No additional management support is needed unless otherwise documented below in the visit note/SLS  

## 2015-05-21 NOTE — Assessment & Plan Note (Addendum)
Depression screen negative. Health Maintenance reviewed -- Order for Mammogram placed. Refuses colonoscopy. Will order Cologuard as she is asymptomatic and average risk. Declines immunizations today. Preventive schedule discussed and handout given in AVS. Will obtain UA today as fasting labs obtained by PCP last month.

## 2015-05-21 NOTE — Progress Notes (Signed)
Patient presents to clinic today for annual exam.  Patient is fasting for labs. Patient declines pelvic and breast examination due to female provider.  Acute Concerns: None today.  Chronic Issues Addressed: Diabetes Mellitus II , uncontrolled with neuropathy -- Patient endorses titrating her Lantus doses every 2-3 days as directed by PCP. Is currently on 50 units of Lantus each AM and 30 each PM. Endorses fasting CBGs are averaging 200 currently. Is now followed by Neurology for polyneuropathy with upcoming appointment scheduled. Is taking lisinopril daily as directed. Patient denies chest pain, palpitations, lightheadedness, dizziness, vision changes or frequent headaches.  Health Maintenance: Dental -- up-to-date Vision -- up-to-date Immunizations -- Declines immunizations today. Colonoscopy -- Overdue. Declines colonoscopy. Cologuard discussed.  Mammogram -- Overdue. Agrees to screening mammogram. PAP -- Declines.  Past Medical History  Diagnosis Date  . Hypertension   . Hyperlipidemia 12/06/2010  . Overweight(278.02) 12/06/2010  . NEPHROLITHIASIS, HX OF 10/07/2010  . RESTLESS LEG SYNDROME 10/25/2010  . PERIPHERAL NEUROPATHY, FEET 10/07/2010  . HEART MURMUR, HX OF 10/07/2010  . Essential hypertension, benign 10/07/2010  . DM 10/07/2010  . Disturbance of skin sensation 10/07/2010  . COMMON MIGRAINE 10/07/2010  . Acute bronchitis 07/08/2011  . Diabetes mellitus type II, uncontrolled 10/07/2010    Qualifier: Diagnosis of  By: Charlett Blake MD, [name]    . PVD (peripheral vascular disease) 01/21/2012  . SOB (shortness of breath) 04/01/2012  . Depression 12/18/2012  . Weakness of right side of body 04/19/2013  . CVA (cerebral infarction) 04/19/2013  . Stroke     Past Surgical History  Procedure Laterality Date  . None      Current Outpatient Prescriptions on File Prior to Visit  Medication Sig Dispense Refill  . aspirin EC 81 MG tablet Take 1 tablet (81 mg total) by mouth 2 (two) times  daily. 30 tablet   . glucose blood (FREESTYLE LITE) test strip 1 each by Other route daily. DX- 250.02  Pts machine is Freestyle Freedom Lite 100 each 5  . ibuprofen (ADVIL,MOTRIN) 400 MG tablet Take 1 tablet (400 mg total) by mouth 2 (two) times daily as needed for pain (with food). 60 tablet 2  . Insulin Glargine (LANTUS) 100 UNIT/ML Solostar Pen 36 units SQ in am and 32 units SQ in pm and increase by 2 unites every 3 days as long as numbers remain above 100 10 pen 6  . Lancets (FREESTYLE) lancets DX- 250.02  Pts machine is Freestyle Freedom Lite 100 each 5  . lisinopril (PRINIVIL,ZESTRIL) 10 MG tablet Take 1 tablet (10 mg total) by mouth 2 (two) times daily. 60 tablet 6  . metFORMIN (GLUCOPHAGE) 1000 MG tablet TAKE 1 TABLET TWICE DAILY AND 1/2 TABLET AT NOON 75 tablet 1  . metoprolol succinate (TOPROL-XL) 25 MG 24 hr tablet Take 1 tablet (25 mg total) by mouth daily. 30 tablet 6  . simvastatin (ZOCOR) 5 MG tablet Take 1 tablet (5 mg total) by mouth daily. 30 tablet 6   No current facility-administered medications on file prior to visit.    Allergies  Allergen Reactions  . Morphine And Related Nausea And Vomiting    Family History  Problem Relation Age of Onset  . Arthritis Mother   . Stroke Brother     previous smoker  . Alcohol abuse Brother     in remission  . Leukemia Brother   . Diabetes Paternal Grandmother   . Healthy Son     Social History   Social History  .  Marital Status: Single    Spouse Name: N/A  . Number of Children: N/A  . Years of Education: N/A   Occupational History  . Not on file.   Social History Main Topics  . Smoking status: Former Smoker -- 0.50 packs/day    Quit date: 09/08/2005  . Smokeless tobacco: Never Used  . Alcohol Use: No  . Drug Use: No  . Sexual Activity:    Partners: Male   Other Topics Concern  . Not on file   Social History Narrative   Lives with husband in a two story home.  Has one child.     Works as a Environmental education officer.     Education: high school.    Review of Systems  Constitutional: Negative for fever and weight loss.  HENT: Negative for ear discharge, ear pain, hearing loss and tinnitus.   Eyes: Negative for blurred vision, double vision, photophobia and pain.  Respiratory: Negative for cough and shortness of breath.   Cardiovascular: Negative for chest pain and palpitations.  Gastrointestinal: Negative for heartburn, nausea, vomiting, abdominal pain, diarrhea, constipation, blood in stool and melena.  Genitourinary: Negative for dysuria, urgency, frequency, hematuria and flank pain.  Musculoskeletal: Negative for falls.  Neurological: Positive for tingling. Negative for dizziness, loss of consciousness and headaches.  Endo/Heme/Allergies: Negative for environmental allergies.  Psychiatric/Behavioral: Negative for depression, suicidal ideas, hallucinations and substance abuse. The patient is not nervous/anxious and does not have insomnia.     BP 149/83 mmHg  Pulse 86  Temp(Src) 98.5 F (36.9 C) (Oral)  Resp 16  Ht 5\' 2"  (1.575 m)  Wt 145 lb 2 oz (65.828 kg)  BMI 26.54 kg/m2  SpO2 98%  LMP 02/12/2011  Physical Exam  Constitutional: She is oriented to person, place, and time and well-developed, well-nourished, and in no distress.  HENT:  Head: Normocephalic and atraumatic.  Right Ear: Tympanic membrane, external ear and ear canal normal.  Left Ear: Tympanic membrane, external ear and ear canal normal.  Nose: Nose normal. No mucosal edema.  Mouth/Throat: Uvula is midline, oropharynx is clear and moist and mucous membranes are normal. No oropharyngeal exudate or posterior oropharyngeal erythema.  Eyes: Conjunctivae are normal. Pupils are equal, round, and reactive to light.  Neck: Neck supple. No thyromegaly present.  Cardiovascular: Normal rate, regular rhythm, normal heart sounds and intact distal pulses.   Pulmonary/Chest: Effort normal and breath sounds normal. No respiratory  distress. She has no wheezes. She has no rales.  Defers Breast exam to PCP.  Abdominal: Soft. Bowel sounds are normal. She exhibits no distension and no mass. There is no tenderness. There is no rebound and no guarding.  Genitourinary:  Defers pelvic exam and PAP smear.  Lymphadenopathy:    She has no cervical adenopathy.  Neurological: She is alert and oriented to person, place, and time. No cranial nerve deficit.  Skin: Skin is warm and dry. No rash noted.  Psychiatric: Affect normal.  Vitals reviewed.   Recent Results (from the past 2160 hour(s))  Copper, serum     Status: None   Collection Time: 03/02/15 11:27 AM  Result Value Ref Range   Copper 134 70 - 175 mcg/dL  Vitamin B12     Status: None   Collection Time: 03/02/15 11:27 AM  Result Value Ref Range   Vitamin B-12 579 211 - 911 pg/mL  ENA 9 Panel     Status: None   Collection Time: 03/21/15  9:13 AM  Result Value Ref Range  ds DNA Ab 1 IU/mL    Comment:                                 IU/mL       Interpretation                               < or = 4    Negative                               5-9         Indeterminate                               > or = 10   Positive      Ribosomal P Protein Ab <1.0 NEG <1.0 NEG AI   SSA (Ro) (ENA) Antibody, IgG <1.0 NEG <1.0 NEG AI   SSB (La) (ENA) Antibody, IgG <1.0 NEG <1.0 NEG AI   Centromere Ab Screen <1.0 NEG <1.0 NEG AI   ENA SM Ab Ser-aCnc <1.0 NEG <1.0 NEG AI   SM/RNP <1.0 NEG <1.0 NEG AI   Scleroderma (Scl-70) (ENA) Antibody, IgG <1.0 NEG <1.0 NEG AI   Jo-1 Antibody, IgG <1.0 NEG <1.0 NEG AI  ANA     Status: Abnormal   Collection Time: 03/21/15  9:13 AM  Result Value Ref Range   Anit Nuclear Antibody(ANA) POS (A) NEGATIVE  Anti-nuclear ab-titer (ANA titer)     Status: None   Collection Time: 03/21/15  9:13 AM  Result Value Ref Range   ANA Titer 1 NEG <1:40      Comment:   Reference Ranges: 1:40 - 1:80 Weakly positive, usually not clinically significant. > or = to  1:160 Result may be clinically significant.                                                                           Due to differences in methodologies, results may differ between the  ANA screen and the Reflex IFA titer and pattern.                                                                           ANA Pattern 1 SEE NOTE     Comment:   Pattern not applicable due to a negative titer result.   Sedimentation rate     Status: Abnormal   Collection Time: 03/21/15  9:13 AM  Result Value Ref Range   Sed Rate 30 (H) 0 - 22 mm/hr  C-reactive protein     Status: None   Collection Time: 03/21/15  9:13 AM  Result Value Ref Range   CRP 0.7 0.5 - 20.0 mg/dL  CBC  Status: None   Collection Time: 04/06/15  8:46 AM  Result Value Ref Range   WBC 7.4 4.0 - 10.5 K/uL   RBC 4.72 3.87 - 5.11 Mil/uL   Platelets 316.0 150.0 - 400.0 K/uL   Hemoglobin 13.8 12.0 - 15.0 g/dL   HCT 41.5 36.0 - 46.0 %   MCV 87.7 78.0 - 100.0 fl   MCHC 33.2 30.0 - 36.0 g/dL   RDW 13.3 11.5 - 15.5 %  Comprehensive metabolic panel     Status: Abnormal   Collection Time: 04/06/15  8:46 AM  Result Value Ref Range   Sodium 135 135 - 145 mEq/L   Potassium 4.4 3.5 - 5.1 mEq/L   Chloride 98 96 - 112 mEq/L   CO2 30 19 - 32 mEq/L   Glucose, Bld 377 (H) 70 - 99 mg/dL   BUN 13 6 - 23 mg/dL   Creatinine, Ser 0.69 0.40 - 1.20 mg/dL   Total Bilirubin 0.6 0.2 - 1.2 mg/dL   Alkaline Phosphatase 119 (H) 39 - 117 U/L   AST 15 0 - 37 U/L   ALT 21 0 - 35 U/L   Total Protein 7.6 6.0 - 8.3 g/dL   Albumin 4.0 3.5 - 5.2 g/dL   Calcium 9.6 8.4 - 10.5 mg/dL   GFR 95.22 >60.00 mL/min  Hemoglobin A1c     Status: Abnormal   Collection Time: 04/06/15  8:46 AM  Result Value Ref Range   Hgb A1c MFr Bld 14.1 (H) 4.6 - 6.5 %    Comment: Glycemic Control Guidelines for People with Diabetes:Non Diabetic:  <6%Goal of Therapy: <7%Additional Action Suggested:  >8%   Lipid panel     Status: Abnormal   Collection Time: 04/06/15  8:46 AM    Result Value Ref Range   Cholesterol 205 (H) 0 - 200 mg/dL    Comment: ATP III Classification       Desirable:  < 200 mg/dL               Borderline High:  200 - 239 mg/dL          High:  > = 240 mg/dL   Triglycerides 95.0 0.0 - 149.0 mg/dL    Comment: Normal:  <150 mg/dLBorderline High:  150 - 199 mg/dL   HDL 51.10 >39.00 mg/dL   VLDL 19.0 0.0 - 40.0 mg/dL   LDL Cholesterol 134 (H) 0 - 99 mg/dL   Total CHOL/HDL Ratio 4     Comment:                Men          Women1/2 Average Risk     3.4          3.3Average Risk          5.0          4.42X Average Risk          9.6          7.13X Average Risk          15.0          11.0                       NonHDL 153.42     Comment: NOTE:  Non-HDL goal should be 30 mg/dL higher than patient's LDL goal (i.e. LDL goal of < 70 mg/dL, would have non-HDL goal of < 100 mg/dL)  TSH     Status: None  Collection Time: 04/06/15  8:46 AM  Result Value Ref Range   TSH 2.12 0.35 - 4.50 uIU/mL  Urinalysis, Routine w reflex microscopic (not at [facility])     Status: Abnormal   Collection Time: 05/21/15  8:31 AM  Result Value Ref Range   Color, Urine YELLOW Yellow;Lt. Yellow   APPearance CLEAR Clear   Specific Gravity, Urine 1.025 1.000-1.030   pH 5.5 5.0 - 8.0   Total Protein, Urine NEGATIVE Negative   Urine Glucose NEGATIVE Negative   Ketones, ur NEGATIVE Negative   Bilirubin Urine NEGATIVE Negative   Hgb urine dipstick SMALL (A) Negative   Urobilinogen, UA 0.2 0.0 - 1.0   Leukocytes, UA NEGATIVE Negative   Nitrite NEGATIVE Negative   WBC, UA 0-2/hpf 0-2/hpf   RBC / HPF 0-2/hpf 0-2/hpf   Squamous Epithelial / LPF Rare(0-4/hpf) Rare(0-4/hpf)    Assessment/Plan: Breast cancer screening Order for screening mammogram placed.  Diabetes mellitus type II, uncontrolled Reported CBGs still above goal (200s) Continue titration of Lantus as directed by Dr. Charlett Blake. Follow-up with her as scheduled for diabetic foot exam and repeat A1C as too soon to draw  today.  Visit for preventive health examination Depression screen negative. Health Maintenance reviewed -- Order for Mammogram placed. Refuses colonoscopy. Will order Cologuard as she is asymptomatic and average risk. Declines immunizations today. Preventive schedule discussed and handout given in AVS. Will obtain UA today as fasting labs obtained by PCP last month.

## 2015-05-28 ENCOUNTER — Ambulatory Visit: Payer: BLUE CROSS/BLUE SHIELD | Admitting: Family Medicine

## 2015-06-08 ENCOUNTER — Other Ambulatory Visit: Payer: BLUE CROSS/BLUE SHIELD

## 2015-07-04 ENCOUNTER — Telehealth: Payer: Self-pay | Admitting: Neurology

## 2015-07-04 NOTE — Telephone Encounter (Signed)
Anderson Malta from [facility]/[phone]// called about lab orders for pt

## 2015-07-05 ENCOUNTER — Other Ambulatory Visit: Payer: Self-pay | Admitting: Neurology

## 2015-07-05 ENCOUNTER — Other Ambulatory Visit (HOSPITAL_COMMUNITY)
Admission: RE | Admit: 2015-07-05 | Discharge: 2015-07-05 | Disposition: A | Discharge status: Home / Self Care | Payer: BLUE CROSS/BLUE SHIELD | Source: Ambulatory Visit | Attending: Neurology | Admitting: Neurology

## 2015-07-05 ENCOUNTER — Ambulatory Visit
Admission: RE | Admit: 2015-07-05 | Discharge: 2015-07-05 | Disposition: A | Discharge status: Home / Self Care | Payer: BLUE CROSS/BLUE SHIELD | Source: Ambulatory Visit | Attending: Neurology | Admitting: Neurology

## 2015-07-05 DIAGNOSIS — G5621 Lesion of ulnar nerve, right upper limb: Secondary | ICD-10-CM

## 2015-07-05 DIAGNOSIS — E1140 Type 2 diabetes mellitus with diabetic neuropathy, unspecified: Secondary | ICD-10-CM

## 2015-07-05 DIAGNOSIS — E1149 Type 2 diabetes mellitus with other diabetic neurological complication: Secondary | ICD-10-CM

## 2015-07-05 DIAGNOSIS — I639 Cerebral infarction, unspecified: Secondary | ICD-10-CM

## 2015-07-05 DIAGNOSIS — R531 Weakness: Secondary | ICD-10-CM

## 2015-07-05 DIAGNOSIS — E0842 Diabetes mellitus due to underlying condition with diabetic polyneuropathy: Secondary | ICD-10-CM

## 2015-07-05 DIAGNOSIS — R29898 Other symptoms and signs involving the musculoskeletal system: Secondary | ICD-10-CM

## 2015-07-05 DIAGNOSIS — G610 Guillain-Barre syndrome: Secondary | ICD-10-CM

## 2015-07-05 LAB — CSF CELL COUNT WITH DIFFERENTIAL
RBC Count, CSF: 0 cu mm
Tube #: 4
WBC, CSF: 1 cu mm (ref 0–5)

## 2015-07-05 LAB — GLUCOSE, CSF: Glucose, CSF: 229 mg/dL — ABNORMAL HIGH (ref 43–76)

## 2015-07-05 LAB — PROTEIN, CSF: Total Protein, CSF: 98 mg/dL — ABNORMAL HIGH (ref 15–45)

## 2015-07-05 NOTE — Progress Notes (Signed)
1 SST tube drawn from right AC to go with spinal fluid to lab. Site is unremarkable and discharge instructions explained to pt.

## 2015-07-05 NOTE — Discharge Instructions (Signed)

## 2015-07-07 LAB — BORRELIA SPECIES DNA, FLUID, PCR: B. burgdorferi DNA: NOT DETECTED

## 2015-07-08 LAB — CSF CULTURE: Gram Stain: NONE SEEN

## 2015-07-08 LAB — ANGIOTENSIN CONVERTING ENZYME, CSF: ACE, CSF: 13 U/L (ref <=15)

## 2015-07-08 LAB — CRYPTOCOCCAL AG, LTX SCR RFLX TITER: Cryptococcal Ag Screen: NOT DETECTED

## 2015-07-08 LAB — CSF CULTURE W GRAM STAIN: Organism ID, Bacteria: NO GROWTH

## 2015-07-09 ENCOUNTER — Telehealth: Payer: Self-pay | Admitting: Neurology

## 2015-07-09 NOTE — Telephone Encounter (Signed)
Please advise 

## 2015-07-09 NOTE — Telephone Encounter (Signed)
Called and informed patient that all CSF studies have not resulted, but from what has resulted there is significant elevation in her CSF glucose 229 (normal 45-76) and CSF protein is also elevated at 98 but this may be due to the marked elevation in her CSF glucose.  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   Donika K. Posey Pronto, DO

## 2015-07-09 NOTE — Telephone Encounter (Signed)
PT called and wanted to know if the results from her spinal tap were available yet/Dawn CB# [phone]

## 2015-07-10 ENCOUNTER — Telehealth: Payer: Self-pay | Admitting: *Deleted

## 2015-07-10 LAB — LEUKEMIA/LYMPHOMA EVALUATION PANEL

## 2015-07-10 LAB — CNS IGG SYNTHESIS RATE, CSF+BLOOD
Albumin, CSF: 68.7 mg/dL — ABNORMAL HIGH (ref 8.0–42.0)
Albumin, Serum(Neph): 3.8 g/dL (ref 3.5–4.9)
IgG Index, CSF: 0.41 (ref <0.66)
IgG, CSF: 7.9 mg/dL — ABNORMAL HIGH (ref 0.8–7.7)
IgG, Serum: 1060 mg/dL (ref 694–1618)
MS CNS IgG Synthesis Rate: -6.2 mg/24 h (ref <=3.3)

## 2015-07-10 NOTE — Telephone Encounter (Signed)
Anderson Malta from [facility] called to let us know that the flow cytometry could not be done due to too few cells.

## 2015-07-12 LAB — MYELIN BASIC PROTEIN, CSF: Myelin Basic Protein: <2 mcg/L (ref 0.0–4.0)

## 2015-07-12 LAB — OLIGOCLONAL BANDS, CSF + SERM

## 2015-07-16 ENCOUNTER — Other Ambulatory Visit: Payer: Self-pay | Admitting: Family Medicine

## 2015-07-19 ENCOUNTER — Ambulatory Visit: Payer: BLUE CROSS/BLUE SHIELD | Admitting: Neurology

## 2015-07-27 ENCOUNTER — Ambulatory Visit: Payer: BLUE CROSS/BLUE SHIELD | Admitting: Family Medicine

## 2015-07-27 LAB — HM DIABETES EYE EXAM

## 2015-08-01 LAB — FUNGUS CULTURE W SMEAR: Smear Result: NONE SEEN

## 2015-08-07 ENCOUNTER — Encounter: Payer: Self-pay | Admitting: Family Medicine

## 2015-08-09 ENCOUNTER — Telehealth: Payer: Self-pay | Admitting: Family Medicine

## 2015-08-09 NOTE — Telephone Encounter (Signed)
Called the patient to clarify she is taking Gabapentin 100 mg three times daily and woundered if that could be reduced.  She has been on Lyrica in the past and is ok with that, but/or reduce Gabapentin.

## 2015-08-09 NOTE — Telephone Encounter (Signed)
Sure if she can tolerate dose can drop to twice daily and see how she does on that dose of gabapentin.

## 2015-08-09 NOTE — Telephone Encounter (Signed)
Relation to [id number] Call back number:[phone] Pharmacy:  Reason for call:  Patient states gabapentin (NEURONTIN) 100 MG capsule makes her feel "high" and would like PCP to decrease MG. Please advise

## 2015-08-09 NOTE — Telephone Encounter (Signed)
Notify there is no lower strength, gabapentin pill. We could try switching to Lyrica which is similar but has lower dosing but it is a brand name so the copay is higher

## 2015-08-10 ENCOUNTER — Telehealth: Payer: Self-pay | Admitting: Family Medicine

## 2015-08-10 MED ORDER — GABAPENTIN 100 MG PO CAPS: 100.0000 mg | ORAL_CAPSULE | Freq: Every day | ORAL | Status: DC

## 2015-08-10 NOTE — Telephone Encounter (Signed)
Prescription sent in  

## 2015-08-10 NOTE — Telephone Encounter (Signed)
Called left message to call back 

## 2015-08-10 NOTE — Telephone Encounter (Signed)
Patient informed and now needs a refill on gabapentin/

## 2015-08-10 NOTE — Telephone Encounter (Signed)
Pt is returning your call.    CB: [phone]

## 2015-08-10 NOTE — Telephone Encounter (Signed)
OK to refill Gabapentin at bid with 30 day supply with 5 rf or 90 d supply with 1 rf at patient discretion

## 2015-08-15 ENCOUNTER — Ambulatory Visit: Payer: BLUE CROSS/BLUE SHIELD

## 2015-08-23 ENCOUNTER — Telehealth: Payer: Self-pay | Admitting: Family Medicine

## 2015-08-23 ENCOUNTER — Telehealth: Payer: Self-pay | Admitting: Behavioral Health

## 2015-08-23 NOTE — Telephone Encounter (Signed)
Pt dropped off paperwork to be filled out ([place])

## 2015-08-23 NOTE — Telephone Encounter (Signed)
Assessed the following gaps in care: BP Mammogram Pap Colonoscopy Flu Shot  Per office visit note on 05/21/15, the patient declined pap, colonoscopy and all immunizations. A referral has already been placed for the mammogram. Updated health maintenance.   Attempted to reach patient to schedule a nurse visit for a blood pressure check and follow-up with having the mammogram completed. Left a message for a return call at the patient's earliest convenience.

## 2015-08-24 NOTE — Telephone Encounter (Signed)
Completed forms faxed to [facility] successfully at [phone]. Pt aware, originals placed up front, copy sent for scanning. JG/CMA

## 2015-08-24 NOTE — Telephone Encounter (Signed)
Forms filled out as much as possible and forwarded to Dr. Charlett Blake. JG//CMA

## 2015-08-27 ENCOUNTER — Telehealth: Payer: Self-pay | Admitting: Family Medicine

## 2015-08-27 ENCOUNTER — Ambulatory Visit: Payer: BLUE CROSS/BLUE SHIELD | Admitting: Family Medicine

## 2015-08-27 NOTE — Telephone Encounter (Signed)
No charge. 

## 2015-08-27 NOTE — Telephone Encounter (Signed)
Caller name: [name]   Relationship to patient: Self  Can be reached: [phone]  Reason for call: Pt lvm at 7:23a. Stating that she will need to cancel her appt due to her husband (chemo pt)  Having to go to the hospital.

## 2015-09-28 ENCOUNTER — Ambulatory Visit: Payer: BLUE CROSS/BLUE SHIELD | Admitting: Family Medicine

## 2015-10-09 ENCOUNTER — Telehealth: Payer: Self-pay | Admitting: Family Medicine

## 2015-10-09 ENCOUNTER — Encounter: Payer: Self-pay | Admitting: Family Medicine

## 2015-10-09 ENCOUNTER — Ambulatory Visit (INDEPENDENT_AMBULATORY_CARE_PROVIDER_SITE_OTHER): Payer: BLUE CROSS/BLUE SHIELD | Admitting: Family Medicine

## 2015-10-09 VITALS — BP 140/90 | HR 83 | Temp 98.2°F | Ht 62.5 in | Wt 147.1 lb

## 2015-10-09 DIAGNOSIS — E785 Hyperlipidemia, unspecified: Secondary | ICD-10-CM | POA: Diagnosis not present

## 2015-10-09 DIAGNOSIS — IMO0001 Reserved for inherently not codable concepts without codable children: Secondary | ICD-10-CM

## 2015-10-09 DIAGNOSIS — I10 Essential (primary) hypertension: Secondary | ICD-10-CM | POA: Diagnosis not present

## 2015-10-09 DIAGNOSIS — F32A Depression, unspecified: Secondary | ICD-10-CM

## 2015-10-09 DIAGNOSIS — F329 Major depressive disorder, single episode, unspecified: Secondary | ICD-10-CM | POA: Diagnosis not present

## 2015-10-09 DIAGNOSIS — E1165 Type 2 diabetes mellitus with hyperglycemia: Secondary | ICD-10-CM

## 2015-10-09 MED ORDER — INSULIN LISPRO 100 UNIT/ML (KWIKPEN): 4.0000 [IU] | PEN_INJECTOR | Freq: Two times a day (BID) | SUBCUTANEOUS | Status: DC | PRN

## 2015-10-09 MED ORDER — ESCITALOPRAM OXALATE 10 MG PO TABS: ORAL_TABLET | ORAL | Status: DC

## 2015-10-09 MED ORDER — INSULIN REGULAR HUMAN 100 UNIT/ML IJ SOLN: 4.0000 [IU] | Freq: Two times a day (BID) | INTRAMUSCULAR | Status: DC | PRN

## 2015-10-09 MED ORDER — METFORMIN HCL ER 500 MG PO TB24: 1000.0000 mg | ORAL_TABLET | Freq: Two times a day (BID) | ORAL | Status: DC

## 2015-10-09 MED ORDER — INSULIN GLARGINE 100 UNIT/ML SOLOSTAR PEN: 50.0000 [IU] | PEN_INJECTOR | Freq: Two times a day (BID) | SUBCUTANEOUS | Status: DC

## 2015-10-09 NOTE — Telephone Encounter (Signed)
Called left message to call back 

## 2015-10-09 NOTE — Patient Instructions (Signed)

## 2015-10-09 NOTE — Telephone Encounter (Signed)
She was not sure she could to a vial and draw it up. So check with her and see if she wants to try the vial due to cost or we can switch to different brand and get a pen but it might cost more.

## 2015-10-09 NOTE — Telephone Encounter (Signed)
Called the patient and she refuses to use a needle.  Please change to another brand so she can get a pen.

## 2015-10-09 NOTE — Telephone Encounter (Signed)
I have printed new Humalog kwikpen order, will need needles to go with it confirm with patient where she wants it sent and send it

## 2015-10-09 NOTE — Progress Notes (Signed)
Subjective:    [name], female    DOB: December 18, 1965, 50 y.o.   MRN: [id number]  Chief Complaint  Patient presents with  . Follow-up    Insulin  . Headache    HPI Patient is in today for follow up. Unfortunately she continues to work excessive hours and not follow a diabetic diet. Her husband is bieng treated for prostate cancer. Her son has a new baby and cannot support his family so the patient is working to help everyone. Her peripheral neuropathy is progressively worse and she is following with neurology. Patient struggles with anhedonia and fatigue but denies suicidal ideation. Notes some back pain as well. Denies CP/palp/SOB/HA/congestion/fevers/GI or GU c/o. Taking meds as prescribed. Patient has been diagnosed with diabetic retinopathy.   Past Medical History  Diagnosis Date  . Hypertension   . Hyperlipidemia 12/06/2010  . Overweight(278.02) 12/06/2010  . NEPHROLITHIASIS, HX OF 10/07/2010  . RESTLESS LEG SYNDROME 10/25/2010  . PERIPHERAL NEUROPATHY, FEET 10/07/2010  . HEART MURMUR, HX OF 10/07/2010  . Essential hypertension, benign 10/07/2010  . DM 10/07/2010  . Disturbance of skin sensation 10/07/2010  . COMMON MIGRAINE 10/07/2010  . Acute bronchitis 07/08/2011  . Diabetes mellitus type II, uncontrolled ([place]) 10/07/2010    Qualifier: Diagnosis of  By: Charlett Blake MD, [name]    . PVD (peripheral vascular disease) ([place]) 01/21/2012  . SOB (shortness of breath) 04/01/2012  . Depression 12/18/2012  . Weakness of right side of body 04/19/2013  . CVA (cerebral infarction) 04/19/2013  . Stroke [facility])     Past Surgical History  Procedure Laterality Date  . None      Family History  Problem Relation Age of Onset  . Arthritis Mother   . Stroke Brother     previous smoker  . Alcohol abuse Brother     in remission  . Leukemia Brother   . Diabetes Paternal Grandmother   . Healthy Son     Social History   Social History  . Marital Status: Single    Spouse Name: N/A    . Number of Children: N/A  . Years of Education: N/A   Occupational History  . Not on file.   Social History Main Topics  . Smoking status: Former Smoker -- 0.50 packs/day    Quit date: 09/08/2005  . Smokeless tobacco: Never Used  . Alcohol Use: No  . Drug Use: No  . Sexual Activity:    Partners: Male   Other Topics Concern  . Not on file   Social History Narrative   Lives with husband in a two story home.  Has one child.     Works as a Glass blower/designer.     Education: high school.    Outpatient Prescriptions Prior to Visit  Medication Sig Dispense Refill  . aspirin EC 81 MG tablet Take 1 tablet (81 mg total) by mouth 2 (two) times daily. 30 tablet   . gabapentin (NEURONTIN) 100 MG capsule Take 1 capsule (100 mg total) by mouth daily. 30 capsule 5  . glucose blood (FREESTYLE LITE) test strip 1 each by Other route daily. DX- 250.02  Pts machine is Freestyle Freedom Lite 100 each 5  . ibuprofen (ADVIL,MOTRIN) 400 MG tablet Take 1 tablet (400 mg total) by mouth 2 (two) times daily as needed for pain (with food). 60 tablet 2  . Lancets (FREESTYLE) lancets DX- 250.02  Pts machine is Freestyle Freedom Lite 100 each 5  . lisinopril (PRINIVIL,ZESTRIL) 10 MG  tablet Take 1 tablet (10 mg total) by mouth 2 (two) times daily. 60 tablet 6  . metoprolol succinate (TOPROL-XL) 25 MG 24 hr tablet Take 1 tablet (25 mg total) by mouth daily. 30 tablet 6  . simvastatin (ZOCOR) 5 MG tablet Take 1 tablet (5 mg total) by mouth daily. 30 tablet 6  . Insulin Glargine (LANTUS) 100 UNIT/ML Solostar Pen 36 units SQ in am and 32 units SQ in pm and increase by 2 unites every 3 days as long as numbers remain above 100 10 pen 6  . LANTUS SOLOSTAR 100 UNIT/ML Solostar Pen INJECT 34 UNITS SQ IN AM & 32 UNITS IN PM. INCREASE BY 2 UNITS EVERY 3 DAYS AS LONG AS ABOVE 100 (Patient taking differently: INJECT 50 UNITS SQ IN AM & 50 UNITS IN PM. INCREASE BY 2 UNITS EVERY 3 DAYS AS LONG AS ABOVE 100) 15 mL 4  .  metFORMIN (GLUCOPHAGE) 1000 MG tablet TAKE 1 TABLET TWICE DAILY AND 1/2 TABLET AT NOON 75 tablet 1   No facility-administered medications prior to visit.    Allergies  Allergen Reactions  . Morphine And Related Nausea And Vomiting    Review of Systems  Constitutional: Positive for malaise/fatigue. Negative for fever.  HENT: Negative for congestion.   Eyes: Negative for discharge.  Respiratory: Negative for shortness of breath.   Cardiovascular: Negative for chest pain, palpitations and leg swelling.  Gastrointestinal: Negative for nausea and abdominal pain.  Genitourinary: Negative for dysuria.  Musculoskeletal: Positive for myalgias and joint pain. Negative for falls.  Skin: Negative for rash.  Neurological: Positive for sensory change and focal weakness. Negative for loss of consciousness and headaches.  Endo/Heme/Allergies: Negative for environmental allergies.  Psychiatric/Behavioral: Positive for depression. The patient is nervous/anxious.        Objective:    Physical Exam  Constitutional: She is oriented to person, place, and time. She appears well-developed and well-nourished. No distress.  HENT:  Head: Normocephalic and atraumatic.  Nose: Nose normal.  Eyes: Right eye exhibits no discharge. Left eye exhibits no discharge.  Neck: Normal range of motion. Neck supple.  Cardiovascular: Normal rate and regular rhythm.   No murmur heard. Pulmonary/Chest: Effort normal and breath sounds normal.  Abdominal: Soft. Bowel sounds are normal. There is no tenderness.  Musculoskeletal: She exhibits no edema.  Neurological: She is alert and oriented to person, place, and time.  Decreased sensation b/l feet.  Skin: Skin is warm and dry.  Psychiatric: She has a normal mood and affect.  Nursing note and vitals reviewed.   BP 140/90 mmHg  Pulse 83  Temp(Src) 98.2 F (36.8 C) (Oral)  Ht 5' 2.5" (1.588 m)  Wt 147 lb 2 oz (66.735 kg)  BMI 26.46 kg/m2  SpO2 97%  LMP  02/12/2011 Wt Readings from Last 3 Encounters:  10/09/15 147 lb 2 oz (66.735 kg)  05/21/15 145 lb 2 oz (65.828 kg)  04/06/15 142 lb (64.411 kg)     Lab Results  Component Value Date   WBC 7.4 04/06/2015   HGB 13.8 04/06/2015   HCT 41.5 04/06/2015   PLT 316.0 04/06/2015   GLUCOSE 377* 04/06/2015   CHOL 205* 04/06/2015   TRIG 95.0 04/06/2015   HDL 51.10 04/06/2015   LDLDIRECT 136.4 04/01/2012   LDLCALC 134* 04/06/2015   ALT 21 04/06/2015   AST 15 04/06/2015   NA 135 04/06/2015   K 4.4 04/06/2015   CL 98 04/06/2015   CREATININE 0.69 04/06/2015   BUN 13  04/06/2015   CO2 30 04/06/2015   TSH 2.12 04/06/2015   HGBA1C 14.1* 04/06/2015   MICROALBUR 1.1 12/22/2011    Lab Results  Component Value Date   TSH 2.12 04/06/2015   Lab Results  Component Value Date   WBC 7.4 04/06/2015   HGB 13.8 04/06/2015   HCT 41.5 04/06/2015   MCV 87.7 04/06/2015   PLT 316.0 04/06/2015   Lab Results  Component Value Date   NA 135 04/06/2015   K 4.4 04/06/2015   CO2 30 04/06/2015   GLUCOSE 377* 04/06/2015   BUN 13 04/06/2015   CREATININE 0.69 04/06/2015   BILITOT 0.6 04/06/2015   ALKPHOS 119* 04/06/2015   AST 15 04/06/2015   ALT 21 04/06/2015   PROT 7.6 04/06/2015   ALBUMIN 4.0 04/06/2015   CALCIUM 9.6 04/06/2015   GFR 95.22 04/06/2015   Lab Results  Component Value Date   CHOL 205* 04/06/2015   Lab Results  Component Value Date   HDL 51.10 04/06/2015   Lab Results  Component Value Date   LDLCALC 134* 04/06/2015   Lab Results  Component Value Date   TRIG 95.0 04/06/2015   Lab Results  Component Value Date   CHOLHDL 4 04/06/2015   Lab Results  Component Value Date   HGBA1C 14.1* 04/06/2015       Assessment & Plan:   Problem List Items Addressed This Visit    Depression    Her husband is currently being treated for prostate cancer and her son and daughter in law are unable to provide for their child so the patient is working excessive hours to help out.  Unfortunately that is not helping her feel better. Declines changes in meds.      Relevant Medications   escitalopram (LEXAPRO) 10 MG tablet   Diabetes mellitus type II, uncontrolled ([place])    Still poorly controlled, new hgba1c ordered and asked to return to endocrinology, added some mealtime insulin 4 units bid. That will help cover when her sugars run high with eating a lot at work during a long shift      Relevant Medications   Insulin Glargine (LANTUS SOLOSTAR) 100 UNIT/ML Solostar Pen   metFORMIN (GLUCOPHAGE-XR) 500 MG 24 hr tablet   insulin lispro (HUMALOG) 100 UNIT/ML KiwkPen   Other Relevant Orders   Hemoglobin A1c   Microalbumin / creatinine urine ratio   ESSENTIAL HYPERTENSION, BENIGN - Primary    Well controlled, no changes to meds. Encouraged heart healthy diet such as the DASH diet and exercise as tolerated.       Relevant Orders   Comprehensive metabolic panel   CBC   TSH   Hyperlipidemia    Tolerating statin, encouraged heart healthy diet, avoid trans fats, minimize simple carbs and saturated fats. Increase exercise as tolerated      Relevant Orders   Comprehensive metabolic panel   Lipid panel   CBC      I have discontinued Ms. Zaccaro's Insulin Glargine, metFORMIN, and insulin regular. I have also changed her LANTUS SOLOSTAR to Insulin Glargine. Additionally, I am having her start on metFORMIN, escitalopram, and insulin lispro. Lastly, I am having her maintain her ibuprofen, glucose blood, freestyle, simvastatin, metoprolol succinate, lisinopril, aspirin EC, and gabapentin.  Meds ordered this encounter  Medications  . Insulin Glargine (LANTUS SOLOSTAR) 100 UNIT/ML Solostar Pen    Sig: Inject 50 Units into the skin 2 (two) times daily.    Dispense:  15 mL    Refill:  4  .  metFORMIN (GLUCOPHAGE-XR) 500 MG 24 hr tablet    Sig: Take 2 tablets (1,000 mg total) by mouth 2 (two) times daily.    Dispense:  120 tablet    Refill:  3  . DISCONTD: insulin regular  (NOVOLIN R,HUMULIN R) 100 units/mL injection    Sig: Inject 0.04 mLs (4 Units total) into the skin 2 (two) times daily as needed for high blood sugar (BS>250). Dispense pen,    Dispense:  15 mL    Refill:  1    Dispense Pen  . escitalopram (LEXAPRO) 10 MG tablet    Sig: Take half tablet daily x 7 days then increase to one tablet daily.    Dispense:  30 tablet    Refill:  2  . insulin lispro (HUMALOG) 100 UNIT/ML KiwkPen    Sig: Inject 0.04 mLs (4 Units total) into the skin 2 (two) times daily as needed (BS>250).    Dispense:  15 mL    Refill:  [place], MD

## 2015-10-09 NOTE — Telephone Encounter (Signed)
Fax from pharmacy to inform the Novolin does not come in a pen.  Ok to change to vial??

## 2015-10-09 NOTE — Progress Notes (Signed)
Pre visit review using our clinic review tool, if applicable. No additional management support is needed unless otherwise documented below in the visit note. 

## 2015-10-10 ENCOUNTER — Ambulatory Visit: Payer: BLUE CROSS/BLUE SHIELD | Admitting: Neurology

## 2015-10-10 ENCOUNTER — Other Ambulatory Visit: Payer: Self-pay

## 2015-10-10 NOTE — Telephone Encounter (Signed)
Sent fax over to [facility] on [address] [place] [employer]

## 2015-10-11 ENCOUNTER — Encounter: Payer: Self-pay | Admitting: Family Medicine

## 2015-10-14 NOTE — Assessment & Plan Note (Signed)
Tolerating statin, encouraged heart healthy diet, avoid trans fats, minimize simple carbs and saturated fats. Increase exercise as tolerated 

## 2015-10-14 NOTE — Assessment & Plan Note (Signed)
Her husband is currently being treated for prostate cancer and her son and daughter in law are unable to provide for their child so the patient is working excessive hours to help out. Unfortunately that is not helping her feel better. Declines changes in meds.

## 2015-10-14 NOTE — Assessment & Plan Note (Signed)
Well controlled, no changes to meds. Encouraged heart healthy diet such as the DASH diet and exercise as tolerated.  °

## 2015-10-14 NOTE — Assessment & Plan Note (Signed)
Still poorly controlled, new hgba1c ordered and asked to return to endocrinology, added some mealtime insulin 4 units bid. That will help cover when her sugars run high with eating a lot at work during a long shift

## 2015-11-12 ENCOUNTER — Encounter: Payer: Self-pay | Admitting: Family Medicine

## 2015-11-12 NOTE — Telephone Encounter (Addendum)
error:[id number] ° °

## 2015-11-20 ENCOUNTER — Ambulatory Visit (HOSPITAL_BASED_OUTPATIENT_CLINIC_OR_DEPARTMENT_OTHER)
Admission: RE | Admit: 2015-11-20 | Discharge: 2015-11-20 | Disposition: A | Discharge status: Home / Self Care | Payer: BLUE CROSS/BLUE SHIELD | Source: Ambulatory Visit | Attending: Family Medicine | Admitting: Family Medicine

## 2015-11-20 ENCOUNTER — Ambulatory Visit (INDEPENDENT_AMBULATORY_CARE_PROVIDER_SITE_OTHER): Payer: BLUE CROSS/BLUE SHIELD | Admitting: Family Medicine

## 2015-11-20 ENCOUNTER — Encounter: Payer: Self-pay | Admitting: Family Medicine

## 2015-11-20 ENCOUNTER — Other Ambulatory Visit: Payer: Self-pay | Admitting: Family Medicine

## 2015-11-20 VITALS — BP 138/86 | HR 86 | Temp 98.3°F | Ht 62.0 in | Wt 151.2 lb

## 2015-11-20 DIAGNOSIS — E1165 Type 2 diabetes mellitus with hyperglycemia: Secondary | ICD-10-CM | POA: Diagnosis not present

## 2015-11-20 DIAGNOSIS — E785 Hyperlipidemia, unspecified: Secondary | ICD-10-CM | POA: Insufficient documentation

## 2015-11-20 DIAGNOSIS — L97909 Non-pressure chronic ulcer of unspecified part of unspecified lower leg with unspecified severity: Secondary | ICD-10-CM | POA: Insufficient documentation

## 2015-11-20 DIAGNOSIS — S81001A Unspecified open wound, right knee, initial encounter: Secondary | ICD-10-CM

## 2015-11-20 DIAGNOSIS — IMO0001 Reserved for inherently not codable concepts without codable children: Secondary | ICD-10-CM

## 2015-11-20 DIAGNOSIS — M79661 Pain in right lower leg: Secondary | ICD-10-CM

## 2015-11-20 DIAGNOSIS — I10 Essential (primary) hypertension: Secondary | ICD-10-CM | POA: Insufficient documentation

## 2015-11-20 DIAGNOSIS — S91001A Unspecified open wound, right ankle, initial encounter: Secondary | ICD-10-CM | POA: Diagnosis not present

## 2015-11-20 DIAGNOSIS — E11622 Type 2 diabetes mellitus with other skin ulcer: Secondary | ICD-10-CM | POA: Insufficient documentation

## 2015-11-20 DIAGNOSIS — W19XXXA Unspecified fall, initial encounter: Secondary | ICD-10-CM | POA: Diagnosis not present

## 2015-11-20 DIAGNOSIS — S81801A Unspecified open wound, right lower leg, initial encounter: Secondary | ICD-10-CM | POA: Insufficient documentation

## 2015-11-20 LAB — CBC WITH DIFFERENTIAL/PLATELET
Basophils Absolute: 0 10*3/uL (ref 0.0–0.1)
Basophils Relative: 0.5 % (ref 0.0–3.0)
Eosinophils Absolute: 0.2 10*3/uL (ref 0.0–0.7)
Eosinophils Relative: 2.8 % (ref 0.0–5.0)
HCT: 37 % (ref 36.0–46.0)
Hemoglobin: 12.4 g/dL (ref 12.0–15.0)
Lymphocytes Relative: 22.3 % (ref 12.0–46.0)
Lymphs Abs: 1.6 10*3/uL (ref 0.7–4.0)
MCHC: 33.6 g/dL (ref 30.0–36.0)
MCV: 85.7 fl (ref 78.0–100.0)
Monocytes Absolute: 0.7 10*3/uL (ref 0.1–1.0)
Monocytes Relative: 10.1 % (ref 3.0–12.0)
Neutro Abs: 4.7 10*3/uL (ref 1.4–7.7)
Neutrophils Relative %: 64.3 % (ref 43.0–77.0)
Platelets: 344 10*3/uL (ref 150.0–400.0)
RBC: 4.32 Mil/uL (ref 3.87–5.11)
RDW: 13.5 % (ref 11.5–15.5)
WBC: 7.3 10*3/uL (ref 4.0–10.5)

## 2015-11-20 LAB — LIPID PANEL
Cholesterol: 172 mg/dL (ref 0–200)
HDL: 43.1 mg/dL (ref >39.00)
LDL Cholesterol: 107 mg/dL — ABNORMAL HIGH (ref 0–99)
NonHDL: 128.53
Total CHOL/HDL Ratio: 4
Triglycerides: 107 mg/dL (ref 0.0–149.0)
VLDL: 21.4 mg/dL (ref 0.0–40.0)

## 2015-11-20 LAB — HEMOGLOBIN A1C: Hgb A1c MFr Bld: 12.8 % — ABNORMAL HIGH (ref 4.6–6.5)

## 2015-11-20 LAB — TSH: TSH: 2.15 u[IU]/mL (ref 0.35–4.50)

## 2015-11-20 LAB — SEDIMENTATION RATE: Sed Rate: 35 mm/hr — ABNORMAL HIGH (ref 0–22)

## 2015-11-20 MED ORDER — CEPHALEXIN 500 MG PO CAPS: 500.0000 mg | ORAL_CAPSULE | Freq: Four times a day (QID) | ORAL | Status: DC

## 2015-11-20 NOTE — Progress Notes (Signed)
Pre visit review using our clinic review tool, if applicable. No additional management support is needed unless otherwise documented below in the visit note. 

## 2015-11-20 NOTE — Patient Instructions (Addendum)
Start a probiotic daily such as [place] 1 cap daily. [facility].  Keep area clean and dry cover with non stick dressing and paper tape when out. Diabetic Neuropathy Diabetic neuropathy is a nerve disease or nerve damage that is caused by diabetes mellitus. About half of all people with diabetes mellitus have some form of nerve damage. Nerve damage is more common in those who have had diabetes mellitus for many years and who generally have not had good control of their blood sugar (glucose) level. Diabetic neuropathy is a common complication of diabetes mellitus. There are three common types of diabetic neuropathy and a fourth type that is less common and less understood:   Peripheral neuropathy--This is the most common type of diabetic neuropathy. It causes damage to the nerves of the feet and legs first and then eventually the hands and arms.The damage affects the ability to sense touch.  Autonomic neuropathy--This type causes damage to the autonomic nervous system, which controls the following functions:  Heartbeat.  Body temperature.  Blood pressure.  Urination.  Digestion.  Sweating.  Sexual function.  Focal neuropathy--Focal neuropathy can be painful and unpredictable and occurs most often in older adults with diabetes mellitus. It involves a specific nerve or one area and often comes on suddenly. It usually does not cause long-term problems.  Radiculoplexus neuropathy-- Sometimes called lumbosacral radiculoplexus neuropathy, radiculoplexus neuropathy affects the nerves of the thighs, hips, buttocks, or legs. It is more common in people with type 2 diabetes mellitus and in older men. It is characterized by debilitating pain, weakness, and atrophy, usually in the thigh muscles. CAUSES  The cause of peripheral, autonomic, and focal neuropathies is diabetes mellitus that is uncontrolled and high glucose levels. The cause of radiculoplexus neuropathy is unknown.  However, it is thought to be caused by inflammation related to uncontrolled glucose levels. SIGNS AND SYMPTOMS  Peripheral Neuropathy Peripheral neuropathy develops slowly over time. When the nerves of the feet and legs no longer work there may be:   Burning, stabbing, or aching pain in the legs or feet.  Inability to feel pressure or pain in your feet. This can lead to:  Thick calluses over pressure areas.  Pressure sores.  Ulcers.  Foot deformities.  Reduced ability to feel temperature changes.  Muscle weakness. Autonomic Neuropathy The symptoms of autonomic neuropathy vary depending on which nerves are affected. Symptoms may include:  Problems with digestion, such as:  Feeling sick to your stomach (nausea).  Vomiting.  Bloating.  Constipation.  Diarrhea.  Abdominal pain.  Difficulty with urination. This occurs if you lose your ability to sense when your bladder is full. Problems include:  Urine leakage (incontinence).  Inability to empty your bladder completely (retention).  Rapid or irregular heartbeat (palpitations).  Blood pressure drops when you stand up (orthostatic hypotension). When you stand up you may feel:  Dizzy.  Weak.  Faint.  In men, inability to attain and maintain an erection.  In women, vaginal dryness and problems with decreased sexual desire and arousal.  Problems with body temperature regulation.  Increased or decreased sweating. Focal Neuropathy  Abnormal eye movements or abnormal alignment of both eyes.  Weakness in the wrist.  Foot drop. This results in an inability to lift the foot properly and abnormal walking or foot movement.  Paralysis on one side of your face (Bell palsy).  Chest or abdominal pain. Radiculoplexus Neuropathy  Sudden, severe pain in your hip, thigh, or buttocks.  Weakness and wasting  of thigh muscles.  Difficulty rising from a seated position.  Abdominal swelling.  Unexplained weight loss  (usually more than 10 lb [4.5 kg]). DIAGNOSIS  Peripheral Neuropathy Your senses may be tested. Sensory function testing can be done with:  A light touch using a monofilament.  A vibration with tuning fork.  A sharp sensation with a pin prick. Other tests that can help diagnose neuropathy are:  Nerve conduction velocity. This test checks the transmission of an electrical current through a nerve.  Electromyography. This shows how muscles respond to electrical signals transmitted by nearby nerves.  Quantitative sensory testing. This is used to assess how your nerves respond to vibrations and changes in temperature. Autonomic Neuropathy Diagnosis is often based on reported symptoms. Tell your health care provider if you experience:   Dizziness.   Constipation.   Diarrhea.   Inappropriate urination or inability to urinate.   Inability to get or maintain an erection.  Tests that may be done include:   Electrocardiography or Holter monitor. These are tests that can help show problems with the heart rate or heart rhythm.   An X-ray exam may be done. Focal Neuropathy Diagnosis is made based on your symptoms and what your health care provider finds during your exam. Other tests may be done. They may include:  Nerve conduction velocities. This checks the transmission of electrical current through a nerve.  Electromyography. This shows how muscles respond to electrical signals transmitted by nearby nerves.  Quantitative sensory testing. This test is used to assess how your nerves respond to vibration and changes in temperature. Radiculoplexus Neuropathy  Often the first thing is to eliminate any other issue or problems that might be the cause, as there is no stick test for diagnosis.  X-ray exam of your spine and lumbar region.  Spinal tap to rule out cancer.  MRI to rule out other lesions. TREATMENT  Once nerve damage occurs, it cannot be reversed. The goal of  treatment is to keep the disease or nerve damage from getting worse and affecting more nerve fibers. Controlling your blood glucose level is the key. Most people with radiculoplexus neuropathy see at least a partial improvement over time. You will need to keep your blood glucose and HbA1c levels in the target range determined by your health care provider. Things that help control blood glucose levels include:   Blood glucose monitoring.   Meal planning.   Physical activity.   Diabetes medicine.  Over time, maintaining lower blood glucose levels helps lessen symptoms. Sometimes, prescription pain medicine is needed. HOME CARE INSTRUCTIONS:  Do not smoke.  Keep your blood glucose level in the range that you and your health care provider have determined acceptable for you.  Keep your blood pressure level in the range that you and your health care provider have determined acceptable for you.  Eat a well-balanced diet.  Be physically active every day. Include strength training and balance exercises.  Protect your feet.  Check your feet every day for sores, cuts, blisters, or signs of infection.  Wear padded socks and supportive shoes. Use orthotic inserts, if necessary.  Regularly check the insides of your shoes for worn spots. Make sure there are no rocks or other items inside your shoes before you put them on. SEEK MEDICAL CARE IF:   You have burning, stabbing, or aching pain in the legs or feet.  You are unable to feel pressure or pain in your feet.  You develop problems with digestion such  as:  Nausea.  Vomiting.  Bloating.  Constipation.  Diarrhea.  Abdominal pain.  You have difficulty with urination, such as:  Incontinence.  Retention.  You have palpitations.  You develop orthostatic hypotension. When you stand up you may feel:  Dizzy.  Weak.  Faint.  You cannot attain and maintain an erection (in men).  You have vaginal dryness and problems  with decreased sexual desire and arousal (in women).  You have severe pain in your thighs, legs, or buttocks.  You have unexplained weight loss.   This information is not intended to replace advice given to you by your health care provider. Make sure you discuss any questions you have with your health care provider.   Document Released: 11/03/2001 Document Revised: 09/15/2014 Document Reviewed: 02/03/2013 Elsevier Interactive Patient Education [employer].

## 2015-11-20 NOTE — Assessment & Plan Note (Signed)
Lesion above right ankle for several weeks. Painful. Proceed with xray and possible CT of lower extremity due to risk factors of uncontrolled diabetes and peripheral neuropathy. Started on Keflex and referred to wound clinic for further management. She is advised to keep the area clean and dry and covered with nonstick dressing with antibiotic ointment when out. Can keep open to air when home

## 2015-11-20 NOTE — Assessment & Plan Note (Signed)
Tolerating statin, encouraged heart healthy diet, avoid trans fats, minimize simple carbs and saturated fats. Increase exercise as tolerated 

## 2015-11-20 NOTE — Assessment & Plan Note (Signed)
hgba1c unacceptable, minimize simple carbs. Increase exercise as tolerated. Continue current meds and check A1C today, she is reporting sugars of 100 to 160 over past couple of weeks.

## 2015-11-20 NOTE — Progress Notes (Signed)
Subjective:    [name], female    DOB: [birth year], 50 y.o.   MRN: [id number]  Chief Complaint  Patient presents with  . Ankle Pain    HPI Patient is in today for sore spot on ankle.  Sharp pain on wound, sore with touch, started as a skin tear 1-2 weeks ago and will not resolve.  Patients blood sugars are from 150 highest blood sugar have need 160-170.  Patient states her neuropathy is getting worse. Denies any drainage from wound. No fevers, chills or worsening fatigue. She has daily pain in legs but is notably worse in right leg since this began, at times sharp and at other times throbbing. Denies CP/palp/SOB/HA/congestion/fevers/GI or GU c/o. Taking meds as prescribed  Past Medical History  Diagnosis Date  . Hypertension   . Hyperlipidemia 12/06/2010  . Overweight(278.02) 12/06/2010  . NEPHROLITHIASIS, HX OF 10/07/2010  . RESTLESS LEG SYNDROME 10/25/2010  . PERIPHERAL NEUROPATHY, FEET 10/07/2010  . HEART MURMUR, HX OF 10/07/2010  . Essential hypertension, benign 10/07/2010  . DM 10/07/2010  . Disturbance of skin sensation 10/07/2010  . COMMON MIGRAINE 10/07/2010  . Acute bronchitis 07/08/2011  . Diabetes mellitus type II, uncontrolled ([place]) 10/07/2010    Qualifier: Diagnosis of  By: Charlett Blake MD, [name]    . PVD (peripheral vascular disease) ([place]) 01/21/2012  . SOB (shortness of breath) 04/01/2012  . Depression 12/18/2012  . Weakness of right side of body 04/19/2013  . CVA (cerebral infarction) 04/19/2013  . Stroke [facility])     Past Surgical History  Procedure Laterality Date  . None      Family History  Problem Relation Age of Onset  . Arthritis Mother   . Stroke Brother     previous smoker  . Alcohol abuse Brother     in remission  . Leukemia Brother   . Diabetes Paternal Grandmother   . Healthy Son     Social History   Social History  . Marital Status: Single    Spouse Name: N/A  . Number of Children: N/A  . Years of Education: N/A   Occupational  History  . Not on file.   Social History Main Topics  . Smoking status: Former Smoker -- 0.50 packs/day    Quit date: 09/08/2005  . Smokeless tobacco: Never Used  . Alcohol Use: No  . Drug Use: No  . Sexual Activity:    Partners: Male   Other Topics Concern  . Not on file   Social History Narrative   Lives with husband in a two story home.  Has one child.     Works as a Glass blower/designer.     Education: high school.    Outpatient Prescriptions Prior to Visit  Medication Sig Dispense Refill  . aspirin EC 81 MG tablet Take 1 tablet (81 mg total) by mouth 2 (two) times daily. 30 tablet   . escitalopram (LEXAPRO) 10 MG tablet Take half tablet daily x 7 days then increase to one tablet daily. 30 tablet 2  . gabapentin (NEURONTIN) 100 MG capsule Take 1 capsule (100 mg total) by mouth daily. 30 capsule 5  . glucose blood (FREESTYLE LITE) test strip 1 each by Other route daily. DX- 250.02  Pts machine is Freestyle Freedom Lite 100 each 5  . ibuprofen (ADVIL,MOTRIN) 400 MG tablet Take 1 tablet (400 mg total) by mouth 2 (two) times daily as needed for pain (with food). 60 tablet 2  . Insulin Glargine (LANTUS  SOLOSTAR) 100 UNIT/ML Solostar Pen Inject 50 Units into the skin 2 (two) times daily. 15 mL 4  . insulin lispro (HUMALOG) 100 UNIT/ML KiwkPen Inject 0.04 mLs (4 Units total) into the skin 2 (two) times daily as needed (BS>250). 15 mL 3  . Lancets (FREESTYLE) lancets DX- 250.02  Pts machine is Freestyle Freedom Lite 100 each 5  . lisinopril (PRINIVIL,ZESTRIL) 10 MG tablet Take 1 tablet (10 mg total) by mouth 2 (two) times daily. 60 tablet 6  . metFORMIN (GLUCOPHAGE-XR) 500 MG 24 hr tablet Take 2 tablets (1,000 mg total) by mouth 2 (two) times daily. 120 tablet 3  . metoprolol succinate (TOPROL-XL) 25 MG 24 hr tablet Take 1 tablet (25 mg total) by mouth daily. 30 tablet 6  . simvastatin (ZOCOR) 5 MG tablet Take 1 tablet (5 mg total) by mouth daily. 30 tablet 6   No  facility-administered medications prior to visit.    Allergies  Allergen Reactions  . Morphine And Related Nausea And Vomiting    Review of Systems  Constitutional: Positive for malaise/fatigue. Negative for fever.  HENT: Negative for congestion.   Eyes: Negative for blurred vision.  Respiratory: Negative for shortness of breath.   Cardiovascular: Negative for chest pain, palpitations and leg swelling.  Gastrointestinal: Negative for nausea, abdominal pain and blood in stool.  Genitourinary: Negative for dysuria and frequency.  Musculoskeletal: Positive for joint pain. Negative for falls.  Skin: Negative for rash.  Neurological: Positive for sensory change. Negative for dizziness, loss of consciousness and headaches.  Endo/Heme/Allergies: Negative for environmental allergies.  Psychiatric/Behavioral: Negative for depression. The patient is not nervous/anxious.        Objective:    Physical Exam  Constitutional: She is oriented to person, place, and time. She appears well-developed and well-nourished. No distress.  HENT:  Head: Normocephalic and atraumatic.  Nose: Nose normal.  Eyes: Right eye exhibits no discharge. Left eye exhibits no discharge.  Neck: Normal range of motion. Neck supple.  Cardiovascular: Normal rate and regular rhythm.   No murmur heard. Pulmonary/Chest: Effort normal and breath sounds normal.  Abdominal: Soft. Bowel sounds are normal. There is no tenderness.  Genitourinary: Vaginal discharge found.  Musculoskeletal: She exhibits no edema.  Lesion above right lateral malleolus. 1 cm, scab in center with 2 cm induration with mild erythema surrounding area  Neurological: She is alert and oriented to person, place, and time.  Skin: Skin is warm and dry.  Chronic venous stasis changes of skin in b/l lower extremities with discoloration/Darkening of skin up shin to 2/3 mark b/l  Psychiatric: She has a normal mood and affect.  Nursing note and vitals  reviewed.   BP 138/86 mmHg  Pulse 86  Temp(Src) 98.3 F (36.8 C) (Oral)  Ht 5\' 2"  (1.575 m)  Wt 151 lb 4 oz (68.607 kg)  BMI 27.66 kg/m2  SpO2 94%  LMP 02/12/2011 Wt Readings from Last 3 Encounters:  11/20/15 151 lb 4 oz (68.607 kg)  10/09/15 147 lb 2 oz (66.735 kg)  05/21/15 145 lb 2 oz (65.828 kg)     Lab Results  Component Value Date   WBC 7.4 04/06/2015   HGB 13.8 04/06/2015   HCT 41.5 04/06/2015   PLT 316.0 04/06/2015   GLUCOSE 377* 04/06/2015   CHOL 205* 04/06/2015   TRIG 95.0 04/06/2015   HDL 51.10 04/06/2015   LDLDIRECT 136.4 04/01/2012   LDLCALC 134* 04/06/2015   ALT 21 04/06/2015   AST 15 04/06/2015   NA 135  04/06/2015   K 4.4 04/06/2015   CL 98 04/06/2015   CREATININE 0.69 04/06/2015   BUN 13 04/06/2015   CO2 30 04/06/2015   TSH 2.12 04/06/2015   HGBA1C 14.1* 04/06/2015   MICROALBUR 1.1 12/22/2011    Lab Results  Component Value Date   TSH 2.12 04/06/2015   Lab Results  Component Value Date   WBC 7.4 04/06/2015   HGB 13.8 04/06/2015   HCT 41.5 04/06/2015   MCV 87.7 04/06/2015   PLT 316.0 04/06/2015   Lab Results  Component Value Date   NA 135 04/06/2015   K 4.4 04/06/2015   CO2 30 04/06/2015   GLUCOSE 377* 04/06/2015   BUN 13 04/06/2015   CREATININE 0.69 04/06/2015   BILITOT 0.6 04/06/2015   ALKPHOS 119* 04/06/2015   AST 15 04/06/2015   ALT 21 04/06/2015   PROT 7.6 04/06/2015   ALBUMIN 4.0 04/06/2015   CALCIUM 9.6 04/06/2015   GFR 95.22 04/06/2015   Lab Results  Component Value Date   CHOL 205* 04/06/2015   Lab Results  Component Value Date   HDL 51.10 04/06/2015   Lab Results  Component Value Date   LDLCALC 134* 04/06/2015   Lab Results  Component Value Date   TRIG 95.0 04/06/2015   Lab Results  Component Value Date   CHOLHDL 4 04/06/2015   Lab Results  Component Value Date   HGBA1C 14.1* 04/06/2015       Assessment & Plan:   Problem List Items Addressed This Visit    Diabetes mellitus type II,  uncontrolled ([place])    hgba1c unacceptable, minimize simple carbs. Increase exercise as tolerated. Continue current meds and check A1C today, she is reporting sugars of 100 to 160 over past couple of weeks.      Relevant Orders   DG Tibia/Fibula Right (Completed)   Sedimentation rate   Lipid panel   Hemoglobin A1c   CBC w/Diff   TSH   Diabetic ulcer of lower extremity (HCC)    Lesion above right ankle for several weeks. Painful. Proceed with xray and possible CT of lower extremity due to risk factors of uncontrolled diabetes and peripheral neuropathy. Started on Keflex and referred to wound clinic for further management. She is advised to keep the area clean and dry and covered with nonstick dressing with antibiotic ointment when out. Can keep open to air when home      Relevant Orders   DG Tibia/Fibula Right (Completed)   Sedimentation rate   Lipid panel   Hemoglobin A1c   CBC w/Diff   TSH   ESSENTIAL HYPERTENSION, BENIGN   Relevant Orders   DG Tibia/Fibula Right (Completed)   Sedimentation rate   Lipid panel   Hemoglobin A1c   CBC w/Diff   TSH   Hyperlipidemia    Tolerating statin, encouraged heart healthy diet, avoid trans fats, minimize simple carbs and saturated fats. Increase exercise as tolerated      Relevant Orders   DG Tibia/Fibula Right (Completed)   Sedimentation rate   Lipid panel   Hemoglobin A1c   CBC w/Diff   TSH   Pain in limb   Relevant Orders   DG Tibia/Fibula Right (Completed)   Sedimentation rate   Lipid panel   Hemoglobin A1c   CBC w/Diff   TSH   Ambulatory referral to Wound Clinic    Other Visit Diagnoses    Open wound of knee, leg (except thigh), and ankle, complicated, right, initial encounter    -  Primary    Relevant Orders    DG Tibia/Fibula Right (Completed)    Sedimentation rate    Lipid panel    Hemoglobin A1c    CBC w/Diff    TSH    Ambulatory referral to Wound Clinic       I am having Ms. Efaw start on cephALEXin. I  am also having her maintain her ibuprofen, glucose blood, freestyle, simvastatin, metoprolol succinate, lisinopril, aspirin EC, gabapentin, Insulin Glargine, metFORMIN, escitalopram, and insulin lispro.  Meds ordered this encounter  Medications  . cephALEXin (KEFLEX) 500 MG capsule    Sig: Take 1 capsule (500 mg total) by mouth 4 (four) times daily. X 10 days    Dispense:  40 capsule    Refill:  0     Penni Homans, MD

## 2015-11-21 ENCOUNTER — Other Ambulatory Visit: Payer: Self-pay | Admitting: Family Medicine

## 2015-11-23 ENCOUNTER — Other Ambulatory Visit: Payer: Self-pay | Admitting: Family Medicine

## 2015-11-23 ENCOUNTER — Telehealth: Payer: Self-pay | Admitting: Family Medicine

## 2015-11-23 DIAGNOSIS — L97309 Non-pressure chronic ulcer of unspecified ankle with unspecified severity: Principal | ICD-10-CM

## 2015-11-23 DIAGNOSIS — E119 Type 2 diabetes mellitus without complications: Secondary | ICD-10-CM

## 2015-11-23 DIAGNOSIS — E11622 Type 2 diabetes mellitus with other skin ulcer: Secondary | ICD-10-CM

## 2015-11-23 DIAGNOSIS — I10 Essential (primary) hypertension: Secondary | ICD-10-CM

## 2015-11-23 NOTE — Telephone Encounter (Signed)
Patient needs lab work for MRI

## 2015-11-25 NOTE — Telephone Encounter (Signed)
Please order her a CMP and let her know she needs it for MRI

## 2015-11-26 NOTE — Telephone Encounter (Signed)
Put order in for cmp. Patient informed lab ordered/scheduled lab appt.

## 2015-11-27 ENCOUNTER — Other Ambulatory Visit (INDEPENDENT_AMBULATORY_CARE_PROVIDER_SITE_OTHER): Payer: BLUE CROSS/BLUE SHIELD

## 2015-11-27 DIAGNOSIS — I10 Essential (primary) hypertension: Secondary | ICD-10-CM | POA: Diagnosis not present

## 2015-11-27 DIAGNOSIS — E119 Type 2 diabetes mellitus without complications: Secondary | ICD-10-CM | POA: Diagnosis not present

## 2015-11-27 LAB — COMPREHENSIVE METABOLIC PANEL
ALT: 19 U/L (ref 0–35)
AST: 15 U/L (ref 0–37)
Albumin: 3.8 g/dL (ref 3.5–5.2)
Alkaline Phosphatase: 95 U/L (ref 39–117)
BUN: 14 mg/dL (ref 6–23)
CO2: 29 mEq/L (ref 19–32)
Calcium: 9.5 mg/dL (ref 8.4–10.5)
Chloride: 101 mEq/L (ref 96–112)
Creatinine, Ser: 0.62 mg/dL (ref 0.40–1.20)
GFR: 107.46 mL/min (ref >60.00)
Glucose, Bld: 307 mg/dL — ABNORMAL HIGH (ref 70–99)
Potassium: 4 mEq/L (ref 3.5–5.1)
Sodium: 136 mEq/L (ref 135–145)
Total Bilirubin: 0.4 mg/dL (ref 0.2–1.2)
Total Protein: 7 g/dL (ref 6.0–8.3)

## 2015-12-01 ENCOUNTER — Ambulatory Visit (HOSPITAL_BASED_OUTPATIENT_CLINIC_OR_DEPARTMENT_OTHER): Admission: RE | Admit: 2015-12-01 | Payer: BLUE CROSS/BLUE SHIELD | Source: Ambulatory Visit

## 2015-12-03 ENCOUNTER — Ambulatory Visit: Payer: BLUE CROSS/BLUE SHIELD | Admitting: Neurology

## 2015-12-03 DIAGNOSIS — Z029 Encounter for administrative examinations, unspecified: Secondary | ICD-10-CM

## 2015-12-04 ENCOUNTER — Telehealth: Payer: Self-pay | Admitting: Family Medicine

## 2015-12-04 MED ORDER — CEPHALEXIN 500 MG PO CAPS: 500.0000 mg | ORAL_CAPSULE | Freq: Four times a day (QID) | ORAL | Status: DC

## 2015-12-04 NOTE — Telephone Encounter (Signed)
Sent in refill. Patient has rescheduled the MRI of the leg until this coming Saturday. The leg is better, but she is still having some sharp pain.

## 2015-12-04 NOTE — Telephone Encounter (Signed)
OK to give a one time refill on her Cephalexin as long as it is improving. Remind her it is very important that she do the MR of her leg to assess infection more

## 2015-12-04 NOTE — Telephone Encounter (Signed)
Pt called in to ask  if PCP could prescribe more of the antibiotic that was prescribed for her ankle. Pt is unsure of the name. Please assist further.     Pharmacy: [id number] - [place], [place] - [name]  CB: [phone]

## 2015-12-08 ENCOUNTER — Ambulatory Visit (HOSPITAL_BASED_OUTPATIENT_CLINIC_OR_DEPARTMENT_OTHER)
Admission: RE | Admit: 2015-12-08 | Discharge: 2015-12-08 | Disposition: A | Discharge status: Home / Self Care | Payer: BLUE CROSS/BLUE SHIELD | Source: Ambulatory Visit | Attending: Family Medicine | Admitting: Family Medicine

## 2015-12-08 DIAGNOSIS — L97309 Non-pressure chronic ulcer of unspecified ankle with unspecified severity: Secondary | ICD-10-CM

## 2015-12-08 DIAGNOSIS — E11622 Type 2 diabetes mellitus with other skin ulcer: Secondary | ICD-10-CM | POA: Insufficient documentation

## 2015-12-08 DIAGNOSIS — L929 Granulomatous disorder of the skin and subcutaneous tissue, unspecified: Secondary | ICD-10-CM | POA: Insufficient documentation

## 2015-12-08 DIAGNOSIS — M609 Myositis, unspecified: Secondary | ICD-10-CM | POA: Insufficient documentation

## 2015-12-08 DIAGNOSIS — L97319 Non-pressure chronic ulcer of right ankle with unspecified severity: Secondary | ICD-10-CM | POA: Diagnosis not present

## 2015-12-08 DIAGNOSIS — L03115 Cellulitis of right lower limb: Secondary | ICD-10-CM | POA: Insufficient documentation

## 2015-12-08 MED ORDER — GADOBENATE DIMEGLUMINE 529 MG/ML IV SOLN: 10.0000 mL | Freq: Once | INTRAVENOUS | Status: DC | PRN

## 2015-12-14 ENCOUNTER — Encounter (HOSPITAL_BASED_OUTPATIENT_CLINIC_OR_DEPARTMENT_OTHER): Payer: BLUE CROSS/BLUE SHIELD | Attending: Internal Medicine

## 2015-12-14 DIAGNOSIS — E1140 Type 2 diabetes mellitus with diabetic neuropathy, unspecified: Secondary | ICD-10-CM | POA: Diagnosis not present

## 2015-12-14 DIAGNOSIS — I10 Essential (primary) hypertension: Secondary | ICD-10-CM | POA: Diagnosis not present

## 2015-12-14 DIAGNOSIS — L97811 Non-pressure chronic ulcer of other part of right lower leg limited to breakdown of skin: Secondary | ICD-10-CM | POA: Diagnosis present

## 2015-12-14 DIAGNOSIS — Z794 Long term (current) use of insulin: Secondary | ICD-10-CM | POA: Diagnosis not present

## 2015-12-21 DIAGNOSIS — L97811 Non-pressure chronic ulcer of other part of right lower leg limited to breakdown of skin: Secondary | ICD-10-CM | POA: Diagnosis not present

## 2015-12-28 DIAGNOSIS — L97811 Non-pressure chronic ulcer of other part of right lower leg limited to breakdown of skin: Secondary | ICD-10-CM | POA: Diagnosis not present

## 2016-01-01 ENCOUNTER — Ambulatory Visit: Payer: BLUE CROSS/BLUE SHIELD | Admitting: Family Medicine

## 2016-01-04 ENCOUNTER — Telehealth: Payer: Self-pay | Admitting: Family Medicine

## 2016-01-04 DIAGNOSIS — L97811 Non-pressure chronic ulcer of other part of right lower leg limited to breakdown of skin: Secondary | ICD-10-CM | POA: Diagnosis not present

## 2016-01-04 MED ORDER — GLUCOSE BLOOD VI STRP: ORAL_STRIP | Status: DC

## 2016-01-04 NOTE — Telephone Encounter (Signed)
Sent in strips and patient infomred

## 2016-01-04 NOTE — Telephone Encounter (Signed)
Relation to [id number] Call back number:[phone] Pharmacy: [id number] - [place], [place] - [name] [phone] (Phone) [phone] (Fax)         Reason for call:  Patient states pharmacy called regarding her glucose blood (FREESTYLE LITE) test strip patient states she is completely out.

## 2016-01-04 NOTE — Telephone Encounter (Signed)
Patient called back and stated she needs one touch ultra pulse glucose test strips

## 2016-01-04 NOTE — Telephone Encounter (Signed)
Refill done.  

## 2016-01-04 NOTE — Addendum Note (Signed)
Addended by: Sharon Seller B on: 01/04/2016 12:58 PM   Modules accepted: Orders, Medications

## 2016-01-10 ENCOUNTER — Ambulatory Visit (HOSPITAL_COMMUNITY)
Admission: RE | Admit: 2016-01-10 | Discharge: 2016-01-10 | Disposition: A | Discharge status: Home / Self Care | Payer: BLUE CROSS/BLUE SHIELD | Source: Ambulatory Visit | Attending: Vascular Surgery | Admitting: Vascular Surgery

## 2016-01-10 ENCOUNTER — Other Ambulatory Visit: Payer: Self-pay | Admitting: Internal Medicine

## 2016-01-10 DIAGNOSIS — L97911 Non-pressure chronic ulcer of unspecified part of right lower leg limited to breakdown of skin: Secondary | ICD-10-CM | POA: Diagnosis present

## 2016-01-10 DIAGNOSIS — E785 Hyperlipidemia, unspecified: Secondary | ICD-10-CM | POA: Insufficient documentation

## 2016-01-10 DIAGNOSIS — E1142 Type 2 diabetes mellitus with diabetic polyneuropathy: Secondary | ICD-10-CM | POA: Diagnosis not present

## 2016-01-10 DIAGNOSIS — I10 Essential (primary) hypertension: Secondary | ICD-10-CM | POA: Diagnosis not present

## 2016-01-10 DIAGNOSIS — I8393 Asymptomatic varicose veins of bilateral lower extremities: Secondary | ICD-10-CM | POA: Diagnosis not present

## 2016-01-11 ENCOUNTER — Telehealth: Payer: Self-pay | Admitting: Family Medicine

## 2016-01-11 ENCOUNTER — Encounter (HOSPITAL_BASED_OUTPATIENT_CLINIC_OR_DEPARTMENT_OTHER): Payer: BLUE CROSS/BLUE SHIELD | Attending: Internal Medicine

## 2016-01-11 DIAGNOSIS — Z7982 Long term (current) use of aspirin: Secondary | ICD-10-CM | POA: Diagnosis not present

## 2016-01-11 DIAGNOSIS — Z79899 Other long term (current) drug therapy: Secondary | ICD-10-CM | POA: Insufficient documentation

## 2016-01-11 DIAGNOSIS — I10 Essential (primary) hypertension: Secondary | ICD-10-CM | POA: Diagnosis not present

## 2016-01-11 DIAGNOSIS — E1151 Type 2 diabetes mellitus with diabetic peripheral angiopathy without gangrene: Secondary | ICD-10-CM | POA: Insufficient documentation

## 2016-01-11 DIAGNOSIS — E1140 Type 2 diabetes mellitus with diabetic neuropathy, unspecified: Secondary | ICD-10-CM | POA: Diagnosis not present

## 2016-01-11 DIAGNOSIS — Z794 Long term (current) use of insulin: Secondary | ICD-10-CM | POA: Diagnosis not present

## 2016-01-11 DIAGNOSIS — M60070 Infective myositis, right ankle: Secondary | ICD-10-CM | POA: Diagnosis not present

## 2016-01-11 DIAGNOSIS — L97811 Non-pressure chronic ulcer of other part of right lower leg limited to breakdown of skin: Secondary | ICD-10-CM | POA: Diagnosis not present

## 2016-01-11 DIAGNOSIS — E11622 Type 2 diabetes mellitus with other skin ulcer: Secondary | ICD-10-CM | POA: Diagnosis present

## 2016-01-11 MED ORDER — TRAMADOL HCL 50 MG PO TABS: 50.0000 mg | ORAL_TABLET | Freq: Three times a day (TID) | ORAL | Status: DC | PRN

## 2016-01-11 NOTE — Telephone Encounter (Signed)
Can be reached: [phone] [facility]acy: [id number] - [place], [place] - [name]  Reason for call: Pt went to Dr. [name] at the wound center and he prescribed abx. He said pt needs to contact Dr. Charlett Blake regarding pain meds for infection in leg wound. Pt stating she wants something light. She gets sick on hydrocodone.

## 2016-01-11 NOTE — Telephone Encounter (Signed)
Print[facility] [place] Patient informed.

## 2016-01-11 NOTE — Telephone Encounter (Signed)
She can have Tramadol 50 mg tabs 1 tab po tid prn pain, disp #40r

## 2016-01-18 DIAGNOSIS — E11622 Type 2 diabetes mellitus with other skin ulcer: Secondary | ICD-10-CM | POA: Diagnosis not present

## 2016-01-25 DIAGNOSIS — E11622 Type 2 diabetes mellitus with other skin ulcer: Secondary | ICD-10-CM | POA: Diagnosis not present

## 2016-01-31 DIAGNOSIS — E11622 Type 2 diabetes mellitus with other skin ulcer: Secondary | ICD-10-CM | POA: Diagnosis not present

## 2016-02-01 DIAGNOSIS — E11622 Type 2 diabetes mellitus with other skin ulcer: Secondary | ICD-10-CM | POA: Diagnosis not present

## 2016-02-05 DIAGNOSIS — E11622 Type 2 diabetes mellitus with other skin ulcer: Secondary | ICD-10-CM | POA: Diagnosis not present

## 2016-02-06 ENCOUNTER — Telehealth: Payer: Self-pay | Admitting: Family Medicine

## 2016-02-06 NOTE — Telephone Encounter (Signed)
Pt dropped off paperwork to be filled out and faxed. (FMLA - in a white envelope). Pt states needs it sent before 02-14-16.

## 2016-02-07 ENCOUNTER — Telehealth: Payer: Self-pay | Admitting: Family Medicine

## 2016-02-07 ENCOUNTER — Ambulatory Visit (INDEPENDENT_AMBULATORY_CARE_PROVIDER_SITE_OTHER): Payer: BLUE CROSS/BLUE SHIELD | Admitting: Medical

## 2016-02-07 ENCOUNTER — Encounter: Payer: Self-pay | Admitting: Medical

## 2016-02-07 VITALS — BP 126/86 | HR 102 | Temp 99.4°F | Ht 62.0 in | Wt 149.4 lb

## 2016-02-07 DIAGNOSIS — T7840XA Allergy, unspecified, initial encounter: Secondary | ICD-10-CM | POA: Diagnosis not present

## 2016-02-07 DIAGNOSIS — M79661 Pain in right lower leg: Secondary | ICD-10-CM | POA: Diagnosis not present

## 2016-02-07 MED ORDER — DICLOFENAC SODIUM 75 MG PO TBEC: 75.0000 mg | DELAYED_RELEASE_TABLET | Freq: Two times a day (BID) | ORAL | Status: DC

## 2016-02-07 MED ORDER — HYDROXYZINE HCL 25 MG PO TABS: 25.0000 mg | ORAL_TABLET | Freq: Three times a day (TID) | ORAL | Status: DC | PRN

## 2016-02-07 MED ORDER — KETOROLAC TROMETHAMINE 60 MG/2ML IM SOLN
60.0000 mg | Freq: Once | INTRAMUSCULAR | Status: AC
  Administered 2016-02-07: 60 mg via INTRAMUSCULAR

## 2016-02-07 MED ORDER — PREDNISONE 10 MG PO TABS: ORAL_TABLET | ORAL | Status: DC

## 2016-02-07 NOTE — Addendum Note (Signed)
Addended by: Anabel Halon on: 02/07/2016 12:27 PM   Modules accepted: Orders

## 2016-02-07 NOTE — Patient Instructions (Addendum)
For your allergic reaction. Etiology undetermined will rx low taper dose of prednisone(will consult with Dr Charlett Blake and send in dose) and hydroxyzine.  Discussed importance of compliance on use of insulin. And eating low sugar diet.  Etiology of you rash is yet to be determined. May be hydrocodone. But you report having been on in the past with no reaction. Hold hydrocodone  presently.  For pain will rx toradol 60 mg im. Also will rx diclofenac. Not to use any other nsaids while on diclofenac.  Attend wound care appointment tomorrow.  Follow up in 4-5 days.

## 2016-02-07 NOTE — Addendum Note (Signed)
Addended by: Tasia Catchings on: 02/07/2016 12:07 PM   Modules accepted: Orders

## 2016-02-07 NOTE — Telephone Encounter (Signed)
Spoke with pt and she voices understanding that she will need to continue to take her lantus and humalog while she is on the prednisone. Pt did not have any further questions.

## 2016-02-07 NOTE — Progress Notes (Signed)
Pre visit review using our clinic review tool, if applicable. No additional management support is needed unless otherwise documented below in the visit note. 

## 2016-02-07 NOTE — Progress Notes (Signed)
Subjective:    [name], female    DOB: 1965/12/23, 50 y.o.   MRN: [id number]  HPI   Pt in with some faint rash in her rt popliteal fossa and rash on lower back and face. Rash started yesterday. Pt states no recent antibiotic. But she may have been on one briefly about one month ago. Rash itches moderate to severe. No wheezing or sob.    Pt took some pain medication that Dr [name] about 10 days ago(for her rt pretibial pai( but caused her nausea.)    Pt  Then used old hydrocodone tabs recently for pain. This was old prescription. Pt also has tried some advil. Some relief with advil. But not adequate. She has been on hydrocodone in the past with no reaction per her report  Pt on Monday she had I and D from wound. Pt seeing wound center. They area trying to determine if she can get skin graft.   Pt is diabetic. She is on lantus. She is on 50 in morning and 50 in afternoon. Pt also uses humaglo     Review of Systems  Constitutional: Negative for fever, chills and fatigue.  Respiratory: Negative for cough, choking, shortness of breath and wheezing.   Cardiovascular: Negative for chest pain and palpitations.  Gastrointestinal: Negative for abdominal pain, blood in stool and anal bleeding.  Musculoskeletal: Negative for back pain.  Neurological: Negative for dizziness, seizures, syncope, light-headedness and numbness.  Hematological: Negative for adenopathy. Does not bruise/bleed easily.  Psychiatric/Behavioral: Negative for behavioral problems and confusion.   Past Medical History  Diagnosis Date  . Hypertension   . Hyperlipidemia 12/06/2010  . Overweight(278.02) 12/06/2010  . NEPHROLITHIASIS, HX OF 10/07/2010  . RESTLESS LEG SYNDROME 10/25/2010  . PERIPHERAL NEUROPATHY, FEET 10/07/2010  . HEART MURMUR, HX OF 10/07/2010  . Essential hypertension, benign 10/07/2010  . DM 10/07/2010  . Disturbance of skin sensation 10/07/2010  . COMMON MIGRAINE 10/07/2010  . Acute  bronchitis 07/08/2011  . Diabetes mellitus type II, uncontrolled ([place]) 10/07/2010    Qualifier: Diagnosis of  By: Charlett Blake MD, [name]    . PVD (peripheral vascular disease) ([place]) 01/21/2012  . SOB (shortness of breath) 04/01/2012  . Depression 12/18/2012  . Weakness of right side of body 04/19/2013  . CVA (cerebral infarction) 04/19/2013  . Stroke [facility])      Social History   Social History  . Marital Status: Single    Spouse Name: N/A  . Number of Children: N/A  . Years of Education: N/A   Occupational History  . Not on file.   Social History Main Topics  . Smoking status: Former Smoker -- 0.50 packs/day    Quit date: 09/08/2005  . Smokeless tobacco: Never Used  . Alcohol Use: No  . Drug Use: No  . Sexual Activity:    Partners: Male   Other Topics Concern  . Not on file   Social History Narrative   Lives with husband in a two story home.  Has one child.     Works as a Glass blower/designer.     Education: high school.    Past Surgical History  Procedure Laterality Date  . None      Family History  Problem Relation Age of Onset  . Arthritis Mother   . Stroke Brother     previous smoker  . Alcohol abuse Brother     in remission  . Leukemia Brother   . Diabetes Paternal Grandmother   .  Healthy Son     Allergies  Allergen Reactions  . Tramadol Other (See Comments)    Pt cant tolerate this pain med.   . Morphine And Related Nausea And Vomiting    Current Outpatient Prescriptions on File Prior to Visit  Medication Sig Dispense Refill  . aspirin EC 81 MG tablet Take 1 tablet (81 mg total) by mouth 2 (two) times daily. 30 tablet   . escitalopram (LEXAPRO) 10 MG tablet Take half tablet daily x 7 days then increase to one tablet daily. 30 tablet 2  . gabapentin (NEURONTIN) 100 MG capsule Take 1 capsule (100 mg total) by mouth daily. 30 capsule 5  . glucose blood (ONE TOUCH ULTRA TEST) test strip Use once daily to check blood sugar.  DX E11.9 100 each 11  . ibuprofen  (ADVIL,MOTRIN) 400 MG tablet Take 1 tablet (400 mg total) by mouth 2 (two) times daily as needed for pain (with food). 60 tablet 2  . Insulin Glargine (LANTUS SOLOSTAR) 100 UNIT/ML Solostar Pen Inject 50 Units into the skin 2 (two) times daily. 15 mL 4  . insulin lispro (HUMALOG) 100 UNIT/ML KiwkPen Inject 0.04 mLs (4 Units total) into the skin 2 (two) times daily as needed (BS>250). (Patient taking differently: Inject 14 Units into the skin 3 (three) times daily. ) 15 mL 3  . Lancets (FREESTYLE) lancets DX- 250.02  Pts machine is Freestyle Freedom Lite 100 each 5  . lisinopril (PRINIVIL,ZESTRIL) 10 MG tablet Take 1 tablet (10 mg total) by mouth 2 (two) times daily. 60 tablet 6  . metFORMIN (GLUCOPHAGE-XR) 500 MG 24 hr tablet Take 2 tablets (1,000 mg total) by mouth 2 (two) times daily. 120 tablet 3  . metoprolol succinate (TOPROL-XL) 25 MG 24 hr tablet Take 1 tablet (25 mg total) by mouth daily. 30 tablet 6  . simvastatin (ZOCOR) 5 MG tablet Take 1 tablet (5 mg total) by mouth daily. 30 tablet 6   No current facility-administered medications on file prior to visit.    BP 126/86 mmHg  Pulse 102  Temp(Src) 99.4 F (37.4 C) (Oral)  Ht 5\' 2"  (1.575 m)  Wt 149 lb 6.4 oz (67.767 kg)  BMI 27.32 kg/m2  SpO2 98%  LMP 02/12/2011       Objective:   Physical Exam  General- No acute distress. Pleasant patient. Neck- Full range of motion, no jvd Lungs- Clear, even and unlabored. Heart- regular rate and rhythm. Neurologic- CNII- XII grossly intact.  Mouth- normal buccal mucosa  Skin-  Rt popliteal area pink rash red rash  Anterior and posterior thorax diffuse faint pink rash. Some faint rash this am as well.(no peeling of the skin)       Assessment & Plan:  For your allergic reaction. Etiology undetermined will rx low taper dose of prednisone and hydroxyzine.  Etiology of you rash is yet to be determined. May be hydrocodone. But you report having been on in the past with no  reaction. Hold this presently.  For pain will rx toradol 60 mg im. Also will rx diflofenac. Not to use any other nsaids while on diclofenac  Attend wound care appointment tomorrow.  Follow up in 4-5 days.

## 2016-02-07 NOTE — Telephone Encounter (Signed)
Patient Name: [name] DOB: 1965/09/13 Initial Comment Caller states, has allergic reaction to hydrocodine RX, face swelling. rash on abd and under breast. She has a low grade fever 99-100 . She had a skin graft. She wants an appt to be seen. Nurse Assessment Nurse: Roosvelt Maser, RN, Barnetta Chapel Date/Time (Eastern Time): 02/07/2016 8:16:09 AM Confirm and document reason for call. If symptomatic, describe symptoms. You must click the next button to save text entered. ---caller states started hydrocodone twodays ago and started with rash same ti me. Has the patient traveled out of the country within the last 30 days? ---Not Applicable Does the patient have any new or worsening symptoms? ---Yes Will a triage be completed? ---Yes Related visit to physician within the last 2 weeks? ---No Does the PT have any chronic conditions? (i.e. diabetes, asthma, etc.) ---Yes List chronic conditions. ---dm Is the patient pregnant or possibly pregnant? (Ask all females between the ages of 31-55) ---No Is this a behavioral health or substance abuse call? ---No Guidelines Guideline Title Affirmed Question Affirmed Notes Rash - Widespread On Drugs Fever Final Disposition User Go to ED Now (or PCP triage) Roosvelt Maser, RN, Barnetta Chapel

## 2016-02-07 NOTE — Telephone Encounter (Signed)
Pt called in stating she has been taking pain meds from another provider and is having facial swelling. Transferred to [facility] with [facility].

## 2016-02-07 NOTE — Telephone Encounter (Signed)
Call pt and please explain did talk with Dr. Yancey Flemings about short taper predniosone. But she needs to be compliant on both lantus and humalog. Low sugar diet.

## 2016-02-07 NOTE — Telephone Encounter (Signed)
error 

## 2016-02-08 ENCOUNTER — Encounter (HOSPITAL_BASED_OUTPATIENT_CLINIC_OR_DEPARTMENT_OTHER): Payer: BLUE CROSS/BLUE SHIELD | Attending: Internal Medicine

## 2016-02-08 DIAGNOSIS — B9561 Methicillin susceptible Staphylococcus aureus infection as the cause of diseases classified elsewhere: Secondary | ICD-10-CM | POA: Diagnosis not present

## 2016-02-08 DIAGNOSIS — I10 Essential (primary) hypertension: Secondary | ICD-10-CM | POA: Insufficient documentation

## 2016-02-08 DIAGNOSIS — I872 Venous insufficiency (chronic) (peripheral): Secondary | ICD-10-CM | POA: Diagnosis not present

## 2016-02-08 DIAGNOSIS — Z79899 Other long term (current) drug therapy: Secondary | ICD-10-CM | POA: Diagnosis not present

## 2016-02-08 DIAGNOSIS — L97311 Non-pressure chronic ulcer of right ankle limited to breakdown of skin: Secondary | ICD-10-CM | POA: Diagnosis not present

## 2016-02-08 DIAGNOSIS — Z7982 Long term (current) use of aspirin: Secondary | ICD-10-CM | POA: Insufficient documentation

## 2016-02-08 DIAGNOSIS — Z794 Long term (current) use of insulin: Secondary | ICD-10-CM | POA: Diagnosis not present

## 2016-02-08 DIAGNOSIS — E11622 Type 2 diabetes mellitus with other skin ulcer: Secondary | ICD-10-CM | POA: Diagnosis present

## 2016-02-08 DIAGNOSIS — E1142 Type 2 diabetes mellitus with diabetic polyneuropathy: Secondary | ICD-10-CM | POA: Diagnosis not present

## 2016-02-08 DIAGNOSIS — E1151 Type 2 diabetes mellitus with diabetic peripheral angiopathy without gangrene: Secondary | ICD-10-CM | POA: Insufficient documentation

## 2016-02-08 DIAGNOSIS — M60070 Infective myositis, right ankle: Secondary | ICD-10-CM | POA: Diagnosis not present

## 2016-02-08 DIAGNOSIS — B9689 Other specified bacterial agents as the cause of diseases classified elsewhere: Secondary | ICD-10-CM | POA: Diagnosis not present

## 2016-02-11 DIAGNOSIS — E11622 Type 2 diabetes mellitus with other skin ulcer: Secondary | ICD-10-CM | POA: Diagnosis not present

## 2016-02-11 NOTE — Telephone Encounter (Signed)
Please see if we can find the paperwork for her so we can fill it out thanks

## 2016-02-11 NOTE — Telephone Encounter (Signed)
[name] is out this week - Pt called to f/u on paperwork. She said that she needs a call back about it. She needs 80/hrs a month for FMLA diabetes. Please call her at [phone].

## 2016-02-12 ENCOUNTER — Ambulatory Visit (INDEPENDENT_AMBULATORY_CARE_PROVIDER_SITE_OTHER): Payer: BLUE CROSS/BLUE SHIELD | Admitting: Family Medicine

## 2016-02-12 ENCOUNTER — Encounter: Payer: Self-pay | Admitting: Family Medicine

## 2016-02-12 VITALS — BP 124/72 | HR 94 | Temp 98.5°F | Ht 62.0 in | Wt 148.0 lb

## 2016-02-12 DIAGNOSIS — E1165 Type 2 diabetes mellitus with hyperglycemia: Secondary | ICD-10-CM

## 2016-02-12 DIAGNOSIS — E11622 Type 2 diabetes mellitus with other skin ulcer: Secondary | ICD-10-CM

## 2016-02-12 DIAGNOSIS — IMO0001 Reserved for inherently not codable concepts without codable children: Secondary | ICD-10-CM

## 2016-02-12 DIAGNOSIS — I10 Essential (primary) hypertension: Secondary | ICD-10-CM

## 2016-02-12 DIAGNOSIS — G5793 Unspecified mononeuropathy of bilateral lower limbs: Secondary | ICD-10-CM

## 2016-02-12 DIAGNOSIS — G5792 Unspecified mononeuropathy of left lower limb: Secondary | ICD-10-CM

## 2016-02-12 DIAGNOSIS — L97909 Non-pressure chronic ulcer of unspecified part of unspecified lower leg with unspecified severity: Secondary | ICD-10-CM

## 2016-02-12 DIAGNOSIS — I6359 Cerebral infarction due to unspecified occlusion or stenosis of other cerebral artery: Secondary | ICD-10-CM

## 2016-02-12 DIAGNOSIS — G5791 Unspecified mononeuropathy of right lower limb: Secondary | ICD-10-CM

## 2016-02-12 NOTE — Telephone Encounter (Signed)
Please help me make sure we get this form on 6/7 and it gets to my desk she needs it completed by 6/8. Shirlean Mylar called over late 6/5 to request a new coy since we could not find the old one.

## 2016-02-12 NOTE — Patient Instructions (Signed)

## 2016-02-12 NOTE — Progress Notes (Signed)
Pre visit review using our clinic review tool, if applicable. No additional management support is needed unless otherwise documented below in the visit note. 

## 2016-02-12 NOTE — Telephone Encounter (Signed)
Have checked with Angie who was doing paper work today, but she has not seen the form.

## 2016-02-12 NOTE — Telephone Encounter (Signed)
Kreg Shropshire at [phone] and spoke to [place] to request another set of FMLA paperwork as unable to locate what was dropped off at the office. I did call after hours, but FMLA paperwork is to be faxed in the morning 02/13/2016 at [phone] ATTN: Dr. Charlett Blake and Shirlean Mylar.

## 2016-02-13 NOTE — Telephone Encounter (Signed)
Paperwork for [employer] received and given to Ivin Booty to leave for Dr. Charlett Blake on Thursday (02/14/16).//AB/CMA

## 2016-02-13 NOTE — Telephone Encounter (Signed)
Paperwork completed and forwarded to PCP for signature/SLS 06/07

## 2016-02-13 NOTE — Telephone Encounter (Signed)
Angie, please let us know if and when you run across this form.  Thanks.

## 2016-02-14 NOTE — Telephone Encounter (Signed)
Faxed signed paperwork to [facility] to [phone] Attn: Saul Fordyce this am 02/14/2016, received fax confirmation. Called the patient to inform paperwork faxed.

## 2016-02-15 ENCOUNTER — Telehealth: Payer: Self-pay | Admitting: Family Medicine

## 2016-02-15 DIAGNOSIS — E11622 Type 2 diabetes mellitus with other skin ulcer: Secondary | ICD-10-CM | POA: Diagnosis not present

## 2016-02-15 NOTE — Telephone Encounter (Signed)
Patient was unable to have surgery this week.  It has been rescheduled for next Friday at 8 AM.  She is aware PCP is out of the office.  Could another provider be willing to see her and do a pain shot/

## 2016-02-15 NOTE — Telephone Encounter (Signed)
l °

## 2016-02-15 NOTE — Telephone Encounter (Signed)
Pt says that her surgery was rescheduled. Pt says that she was to come in today to see PCP for a pain shot. Pt says that she would need to reschedule that also for Thursday, June 15. Informed pt that PCP will be out of the office on that day.     Pt would like a call back from [place] directly.    CB: [phone]

## 2016-02-15 NOTE — Telephone Encounter (Signed)
Can see Mackie Pai, he has agreed to see her early on 6/16 please let her know a time

## 2016-02-15 NOTE — Telephone Encounter (Signed)
Called left msg. To call back 

## 2016-02-18 NOTE — Telephone Encounter (Signed)
Patient had already been notified and someone here called and scheduled her for Thursday am with Percell Miller.  Patient just wanted to keep it on Thursday.

## 2016-02-19 ENCOUNTER — Telehealth: Payer: Self-pay | Admitting: Medical

## 2016-02-19 NOTE — Telephone Encounter (Signed)
I saw that pt is listed to see me. Reason states surgical. Is this a clearance for surgery. If so for what surgery. Would like to know so I can review chart. Since I am not her pcp. May need to see Dr. Charlett Blake or her cardiologist. Depends but would like to review her chart first and know what type of surgery she will have. And when is date?

## 2016-02-19 NOTE — Telephone Encounter (Signed)
Pt's PCP Dr. Charlett Blake is out of the office and she has a follow up with cardiology after she sees you for surgical clearance.

## 2016-02-20 ENCOUNTER — Other Ambulatory Visit: Payer: Self-pay | Admitting: Family Medicine

## 2016-02-20 NOTE — Telephone Encounter (Signed)
Called and spoke with the pt regarding the medication refill request for the Metformin, and she stated that she no longer takes the Metformin.  Informed the pt that we received the request from the pharmacy, and she stated that it was a auto refill request.   Informed the pt that she may one to inform the pharmacy that she no longer take the Metformin.  Pt agreed.  Rx denied and note sent to the pharmacy.//AB/CMA

## 2016-02-20 NOTE — Telephone Encounter (Signed)
Called and [facility] @ 8:26am @ [phone]) asking the pt to RTC regarding medication refill request.  Needing to verify Metformin directions.//AB/CMA

## 2016-02-21 ENCOUNTER — Encounter: Payer: Self-pay | Admitting: Medical

## 2016-02-21 ENCOUNTER — Ambulatory Visit (INDEPENDENT_AMBULATORY_CARE_PROVIDER_SITE_OTHER): Payer: BLUE CROSS/BLUE SHIELD | Admitting: Medical

## 2016-02-21 VITALS — BP 122/80 | HR 76 | Temp 98.0°F | Ht 62.0 in | Wt 147.2 lb

## 2016-02-21 DIAGNOSIS — M79661 Pain in right lower leg: Secondary | ICD-10-CM

## 2016-02-21 MED ORDER — KETOROLAC TROMETHAMINE 60 MG/2ML IM SOLN
60.0000 mg | Freq: Once | INTRAMUSCULAR | Status: AC
  Administered 2016-02-21: 60 mg via INTRAMUSCULAR

## 2016-02-21 NOTE — Progress Notes (Signed)
Subjective:    [name], female    DOB: [birth year], 50 y.o.   MRN: [id number]  HPI   Pt in for follow up. Pt is here for toradol. Pt has lower ext pain to her legs. She was origninally going to come in on Friday after the debridement procedure for toradol injection. She states toradol helped for about 2.5 days. Pt wants to know if she can have tablet to handle pain. Pt concerned about pain from procedure day of procedure and day after.(pt has had problems after attempted skin graft) No history of any bleeding disorders.  Pt states she may have to do general anesthesia procedure to debried. But that decision is pending.   Pt has history of uncontrolled diabetes.   Review of Systems  Constitutional: Negative for fever and diaphoresis.  Respiratory: Negative for cough, chest tightness, shortness of breath and wheezing.   Cardiovascular: Negative for chest pain and palpitations.  Musculoskeletal: Negative for back pain.       Rt lower ext pain. Associated with debridement.  Skin: Negative for rash.  Neurological: Negative for dizziness and headaches.  Hematological: Negative for adenopathy. Does not bruise/bleed easily.  Psychiatric/Behavioral: Negative for behavioral problems and confusion.   Past Medical History  Diagnosis Date  . Hypertension   . Hyperlipidemia 12/06/2010  . Overweight(278.02) 12/06/2010  . NEPHROLITHIASIS, HX OF 10/07/2010  . RESTLESS LEG SYNDROME 10/25/2010  . PERIPHERAL NEUROPATHY, FEET 10/07/2010  . HEART MURMUR, HX OF 10/07/2010  . Essential hypertension, benign 10/07/2010  . DM 10/07/2010  . Disturbance of skin sensation 10/07/2010  . COMMON MIGRAINE 10/07/2010  . Acute bronchitis 07/08/2011  . Diabetes mellitus type II, uncontrolled ([place]) 10/07/2010    Qualifier: Diagnosis of  By: Charlett Blake MD, [name]    . PVD (peripheral vascular disease) ([place]) 01/21/2012  . SOB (shortness of breath) 04/01/2012  . Depression 12/18/2012  . Weakness of right side  of body 04/19/2013  . CVA (cerebral infarction) 04/19/2013  . Stroke [facility])      Social History   Social History  . Marital Status: Single    Spouse Name: N/A  . Number of Children: N/A  . Years of Education: N/A   Occupational History  . Not on file.   Social History Main Topics  . Smoking status: Former Smoker -- 0.50 packs/day    Quit date: 09/08/2005  . Smokeless tobacco: Never Used  . Alcohol Use: No  . Drug Use: No  . Sexual Activity:    Partners: Male   Other Topics Concern  . Not on file   Social History Narrative   Lives with husband in a two story home.  Has one child.     Works as a Glass blower/designer.     Education: high school.    Past Surgical History  Procedure Laterality Date  . None      Family History  Problem Relation Age of Onset  . Arthritis Mother   . Stroke Brother     previous smoker  . Alcohol abuse Brother     in remission  . Leukemia Brother   . Diabetes Paternal Grandmother   . Healthy Son     Allergies  Allergen Reactions  . Tramadol Other (See Comments)    Pt cant tolerate this pain med.   . Morphine And Related Nausea And Vomiting    Current Outpatient Prescriptions on File Prior to Visit  Medication Sig Dispense Refill  . aspirin EC 81 MG  tablet Take 1 tablet (81 mg total) by mouth 2 (two) times daily. 30 tablet   . diclofenac (VOLTAREN) 75 MG EC tablet Take 1 tablet (75 mg total) by mouth 2 (two) times daily. 30 tablet 0  . escitalopram (LEXAPRO) 10 MG tablet Take half tablet daily x 7 days then increase to one tablet daily. 30 tablet 2  . gabapentin (NEURONTIN) 100 MG capsule Take 1 capsule (100 mg total) by mouth daily. 30 capsule 5  . glucose blood (ONE TOUCH ULTRA TEST) test strip Use once daily to check blood sugar.  DX E11.9 100 each 11  . hydrOXYzine (ATARAX/VISTARIL) 25 MG tablet Take 1 tablet (25 mg total) by mouth 3 (three) times daily as needed. 30 tablet 0  . Insulin Glargine (LANTUS SOLOSTAR) 100 UNIT/ML  Solostar Pen Inject 50 Units into the skin 2 (two) times daily. 15 mL 4  . insulin lispro (HUMALOG) 100 UNIT/ML KiwkPen Inject 0.04 mLs (4 Units total) into the skin 2 (two) times daily as needed (BS>250). (Patient taking differently: Inject 14 Units into the skin 3 (three) times daily. ) 15 mL 3  . Lancets (FREESTYLE) lancets DX- 250.02  Pts machine is Freestyle Freedom Lite 100 each 5  . lisinopril (PRINIVIL,ZESTRIL) 10 MG tablet Take 1 tablet (10 mg total) by mouth 2 (two) times daily. 60 tablet 6  . metFORMIN (GLUCOPHAGE-XR) 500 MG 24 hr tablet Take 2 tablets (1,000 mg total) by mouth 2 (two) times daily. 120 tablet 3  . metoprolol succinate (TOPROL-XL) 25 MG 24 hr tablet Take 1 tablet (25 mg total) by mouth daily. 30 tablet 6  . simvastatin (ZOCOR) 5 MG tablet Take 1 tablet (5 mg total) by mouth daily. 30 tablet 6   No current facility-administered medications on file prior to visit.    BP 122/80 mmHg  Pulse 76  Temp(Src) 98 F (36.7 C) (Oral)  Ht 5\' 2"  (1.575 m)  Wt 147 lb 3.2 oz (66.769 kg)  BMI 26.92 kg/m2  SpO2 98%  LMP 02/12/2011       Objective:   Physical Exam   General- No acute distress. Pleasant patient. Lungs- Clear, even and unlabored. Heart- regular rate and rhythm. Neurologic- CNII- XII grossly intact.  Rt lower ext- wrapped and dressed. The proximal edge of skin at edge of dressing not red, no warmth and not tender.       Assessment & Plan:  For your leg pain associated with debridement procedure will give toradol today. You came in early but report it helped for 2.5 days last time so expect it will help.  You mentioned that your surgeon has not decided if general anesthesia procedure is needed. If they determine if that is needed then I I ask you call Dr. Charlett Blake nurse and see if we can give general anesthesia surgical clearance or if we would need to include cardiology evaluation.  Follow up as regularly scheduled with pcp or day before or day of out  pt debridement procedure.  [place], Percell Miller, PA-C

## 2016-02-21 NOTE — Addendum Note (Signed)
Addended by: Tasia Catchings on: 02/21/2016 10:53 AM   Modules accepted: Orders

## 2016-02-21 NOTE — Progress Notes (Signed)
Pre visit review using our clinic review tool, if applicable. No additional management support is needed unless otherwise documented below in the visit note. 

## 2016-02-21 NOTE — Patient Instructions (Addendum)
For your leg pain associated with debridement procedure will give toradol today. You came in early but report it helped for 2.5 days last time so expect it will help.  You mentioned that your surgeon has not decided if general anesthesia procedure is needed. If they determine if that is needed then I I ask you call Dr. Charlett Blake nurse and see if we can give general anesthesia surgical clearance or if we would need to include cardiology evaluation.  Follow up as regularly scheduled with pcp or day before or day of out pt debridement procedure.

## 2016-02-22 ENCOUNTER — Telehealth: Payer: Self-pay | Admitting: Family Medicine

## 2016-02-22 ENCOUNTER — Telehealth: Payer: Self-pay | Admitting: Emergency Medicine

## 2016-02-22 ENCOUNTER — Other Ambulatory Visit: Payer: Self-pay | Admitting: Internal Medicine

## 2016-02-22 DIAGNOSIS — E11622 Type 2 diabetes mellitus with other skin ulcer: Secondary | ICD-10-CM | POA: Diagnosis not present

## 2016-02-22 DIAGNOSIS — T148XXD Other injury of unspecified body region, subsequent encounter: Secondary | ICD-10-CM

## 2016-02-22 MED ORDER — DICLOFENAC SODIUM 75 MG PO TBEC: 75.0000 mg | DELAYED_RELEASE_TABLET | Freq: Two times a day (BID) | ORAL | Status: DC

## 2016-02-22 MED ORDER — HYDROCODONE-ACETAMINOPHEN 5-325 MG PO TABS: 1.0000 | ORAL_TABLET | Freq: Four times a day (QID) | ORAL | Status: DC | PRN

## 2016-02-22 MED FILL — HYDROCODON-APAP 5-325: 5-325 | 2 days supply | Qty: 10 | Fill #0

## 2016-02-22 NOTE — Telephone Encounter (Signed)
Reviewing chart -- last note with Dr. Charlett Blake regarding this shows she typically does very well with the injections and it seems she had had this previously. Apparently the injection given yesterday was sub-therapeutic. I have her having a current script for Voltaren tablets. Is she taking these and her Gabapentin as directed? If so, we can write Rx for low dose of Hydrocodone (5-325) to take every 8 hours as needed for severe pain. Will allow 10 tablets. 0 refills. She has to come pick up medication as it cannot be sent electronically. No diclofenac or tylenol why taking this.

## 2016-02-22 NOTE — Assessment & Plan Note (Signed)
Well controlled, no changes to meds. Encouraged heart healthy diet such as the DASH diet and exercise as tolerated.  °

## 2016-02-22 NOTE — Telephone Encounter (Signed)
Spoke to pt and she states that she had a debridement procedure this morning at the wound center. Since the procedure pt states that the pain in her leg has increased and feel like it's constantly burning. Pt was seen here yesterday and was given a shot of Toradol for pain which has not helped with her pain. Pt would rather not have to come in today and would prefer to get medication called into the pharmacy that will stop the pain if possible.   Pt states that she is unhappy with the wound center and would like a referral to a different wound center.

## 2016-02-22 NOTE — Assessment & Plan Note (Signed)
No recent new symptoms. We will fill out FMLA paperwork for her to allow up to 80 hours monthly as needed so she can minimize her risk of a recurrent event and to help her manage her numerous conditions

## 2016-02-22 NOTE — Assessment & Plan Note (Signed)
Daily pain continues she manages with low dose Gabapentin.

## 2016-02-22 NOTE — Telephone Encounter (Signed)
Spoke to pt and she states that she is not currently taking Voltaren. She said since she had the Toradol shot yesterday she was not sure if it was safe to take the Voltaren and the Toradol together. Pt is taking Gabapentin as directed.   Pt will need a refill on the Voltaren.

## 2016-02-22 NOTE — Progress Notes (Signed)
[name], female   DOB: [birth year], 50 y.o.   MRN: [id number]   Subjective:    [name], female    DOB: 10/31/1965, 50 y.o.   MRN: [id number]  Chief Complaint  Patient presents with  . Follow-up    diabetes    HPI Patient is in today for follow up on diabetes and diabetic ulcer on right leg. The wound is now being managed by wound management. Debridement is very painful. She continues to try and work but has to miss a great deal of work to manage her conditions. She has also had some sore throats in am and nausea recently. Denies CP/palp/SOB/HA/congestion/fevers GU c/o. Taking meds as prescribed  Past Medical History  Diagnosis Date  . Hypertension   . Hyperlipidemia 12/06/2010  . Overweight(278.02) 12/06/2010  . NEPHROLITHIASIS, HX OF 10/07/2010  . RESTLESS LEG SYNDROME 10/25/2010  . PERIPHERAL NEUROPATHY, FEET 10/07/2010  . HEART MURMUR, HX OF 10/07/2010  . Essential hypertension, benign 10/07/2010  . DM 10/07/2010  . Disturbance of skin sensation 10/07/2010  . COMMON MIGRAINE 10/07/2010  . Acute bronchitis 07/08/2011  . Diabetes mellitus type II, uncontrolled ([place]) 10/07/2010    Qualifier: Diagnosis of  By: Charlett Blake MD, [name]    . PVD (peripheral vascular disease) ([place]) 01/21/2012  . SOB (shortness of breath) 04/01/2012  . Depression 12/18/2012  . Weakness of right side of body 04/19/2013  . CVA (cerebral infarction) 04/19/2013  . Stroke [facility])     Past Surgical History  Procedure Laterality Date  . None      Family History  Problem Relation Age of Onset  . Arthritis Mother   . Stroke Brother     previous smoker  . Alcohol abuse Brother     in remission  . Leukemia Brother   . Diabetes Paternal Grandmother   . Healthy Son     Social History   Social History  . Marital Status: Single    Spouse Name: N/A  . Number of Children: N/A  . Years of Education: N/A   Occupational History  . Not on file.   Social History Main Topics  .  Smoking status: Former Smoker -- 0.50 packs/day    Quit date: 09/08/2005  . Smokeless tobacco: Never Used  . Alcohol Use: No  . Drug Use: No  . Sexual Activity:    Partners: Male   Other Topics Concern  . Not on file   Social History Narrative   Lives with husband in a two story home.  Has one child.     Works as a Glass blower/designer.     Education: high school.    Outpatient Prescriptions Prior to Visit  Medication Sig Dispense Refill  . aspirin EC 81 MG tablet Take 1 tablet (81 mg total) by mouth 2 (two) times daily. 30 tablet   . diclofenac (VOLTAREN) 75 MG EC tablet Take 1 tablet (75 mg total) by mouth 2 (two) times daily. 30 tablet 0  . escitalopram (LEXAPRO) 10 MG tablet Take half tablet daily x 7 days then increase to one tablet daily. 30 tablet 2  . gabapentin (NEURONTIN) 100 MG capsule Take 1 capsule (100 mg total) by mouth daily. 30 capsule 5  . glucose blood (ONE TOUCH ULTRA TEST) test strip Use once daily to check blood sugar.  DX E11.9 100 each 11  . hydrOXYzine (ATARAX/VISTARIL) 25 MG tablet Take 1 tablet (25 mg total) by mouth 3 (three) times daily as  needed. 30 tablet 0  . Insulin Glargine (LANTUS SOLOSTAR) 100 UNIT/ML Solostar Pen Inject 50 Units into the skin 2 (two) times daily. 15 mL 4  . insulin lispro (HUMALOG) 100 UNIT/ML KiwkPen Inject 0.04 mLs (4 Units total) into the skin 2 (two) times daily as needed (BS>250). (Patient taking differently: Inject 14 Units into the skin 3 (three) times daily. ) 15 mL 3  . Lancets (FREESTYLE) lancets DX- 250.02  Pts machine is Freestyle Freedom Lite 100 each 5  . lisinopril (PRINIVIL,ZESTRIL) 10 MG tablet Take 1 tablet (10 mg total) by mouth 2 (two) times daily. 60 tablet 6  . metFORMIN (GLUCOPHAGE-XR) 500 MG 24 hr tablet Take 2 tablets (1,000 mg total) by mouth 2 (two) times daily. 120 tablet 3  . metoprolol succinate (TOPROL-XL) 25 MG 24 hr tablet Take 1 tablet (25 mg total) by mouth daily. 30 tablet 6  . simvastatin (ZOCOR) 5  MG tablet Take 1 tablet (5 mg total) by mouth daily. 30 tablet 6  . predniSONE (DELTASONE) 10 MG tablet 4 tab po day 1, 3 tab po day 2, 2 tab po day 3, and 1 tab po day 4. (Patient not taking: Reported on 02/21/2016) 10 tablet 0   No facility-administered medications prior to visit.    Allergies  Allergen Reactions  . Tramadol Other (See Comments)    Pt cant tolerate this pain med.   . Morphine And Related Nausea And Vomiting    Review of Systems  Constitutional: Positive for malaise/fatigue. Negative for fever.  HENT: Negative for congestion.   Eyes: Negative for blurred vision.  Respiratory: Negative for shortness of breath.   Cardiovascular: Negative for chest pain, palpitations and leg swelling.  Gastrointestinal: Negative for nausea, abdominal pain and blood in stool.  Genitourinary: Positive for frequency. Negative for dysuria.  Musculoskeletal: Positive for myalgias and joint pain. Negative for falls.  Skin: Negative for rash.  Neurological: Positive for tingling. Negative for dizziness, loss of consciousness and headaches.  Endo/Heme/Allergies: Negative for environmental allergies.  Psychiatric/Behavioral: Negative for depression. The patient is not nervous/anxious.        Objective:    Physical Exam  Constitutional: She is oriented to person, place, and time. She appears well-developed and well-nourished. No distress.  HENT:  Head: Normocephalic and atraumatic.  Nose: Nose normal.  Eyes: Right eye exhibits no discharge. Left eye exhibits no discharge.  Neck: Normal range of motion. Neck supple.  Cardiovascular: Normal rate and regular rhythm.   No murmur heard. Pulmonary/Chest: Effort normal and breath sounds normal.  Abdominal: Soft. Bowel sounds are normal. There is no tenderness.  Musculoskeletal: She exhibits no edema.  Right lateral lower le bandaged.   Neurological: She is alert and oriented to person, place, and time.  Skin: Skin is warm and dry.    Psychiatric: She has a normal mood and affect.  Nursing note and vitals reviewed.   BP 124/72 mmHg  Pulse 94  Temp(Src) 98.5 F (36.9 C) (Oral)  Ht 5\' 2"  (1.575 m)  Wt 148 lb (67.132 kg)  BMI 27.06 kg/m2  SpO2 97%  LMP 02/12/2011 Wt Readings from Last 3 Encounters:  02/21/16 147 lb 3.2 oz (66.769 kg)  02/12/16 148 lb (67.132 kg)  02/07/16 149 lb 6.4 oz (67.767 kg)     Lab Results  Component Value Date   WBC 7.3 11/20/2015   HGB 12.4 11/20/2015   HCT 37.0 11/20/2015   PLT 344.0 11/20/2015   GLUCOSE 307* 11/27/2015   CHOL  172 11/20/2015   TRIG 107.0 11/20/2015   HDL 43.10 11/20/2015   LDLDIRECT 136.4 04/01/2012   LDLCALC 107* 11/20/2015   ALT 19 11/27/2015   AST 15 11/27/2015   NA 136 11/27/2015   K 4.0 11/27/2015   CL 101 11/27/2015   CREATININE 0.62 11/27/2015   BUN 14 11/27/2015   CO2 29 11/27/2015   TSH 2.15 11/20/2015   HGBA1C 12.8* 11/20/2015   MICROALBUR 1.1 12/22/2011    Lab Results  Component Value Date   TSH 2.15 11/20/2015   Lab Results  Component Value Date   WBC 7.3 11/20/2015   HGB 12.4 11/20/2015   HCT 37.0 11/20/2015   MCV 85.7 11/20/2015   PLT 344.0 11/20/2015   Lab Results  Component Value Date   NA 136 11/27/2015   K 4.0 11/27/2015   CO2 29 11/27/2015   GLUCOSE 307* 11/27/2015   BUN 14 11/27/2015   CREATININE 0.62 11/27/2015   BILITOT 0.4 11/27/2015   ALKPHOS 95 11/27/2015   AST 15 11/27/2015   ALT 19 11/27/2015   PROT 7.0 11/27/2015   ALBUMIN 3.8 11/27/2015   CALCIUM 9.5 11/27/2015   GFR 107.46 11/27/2015   Lab Results  Component Value Date   CHOL 172 11/20/2015   Lab Results  Component Value Date   HDL 43.10 11/20/2015   Lab Results  Component Value Date   LDLCALC 107* 11/20/2015   Lab Results  Component Value Date   TRIG 107.0 11/20/2015   Lab Results  Component Value Date   CHOLHDL 4 11/20/2015   Lab Results  Component Value Date   HGBA1C 12.8* 11/20/2015       Assessment & Plan:   Problem  List Items Addressed This Visit    Neuropathic pain of both legs    Daily pain continues she manages with low dose Gabapentin.       ESSENTIAL HYPERTENSION, BENIGN    Well controlled, no changes to meds. Encouraged heart healthy diet such as the DASH diet and exercise as tolerated.       Diabetic ulcer of lower extremity ([place]) - Primary    Wound clinic is now managing her diabetic ulcer on her right leg. She notes extreme pain when she goes in for debridement and she notes a Toradol shot she received in our office helped her pain greater than any other pain med she has tried. She requests to come in for shot the day of her next debridement and we will set that up. Wound management requires a significant amount of time. Will complete FMLA paperwork for patient       Diabetes mellitus type II, uncontrolled ([place])    minimize simple carbs. Increase exercise as tolerated. Continue current meds continue Lantus bid.       CVA (cerebral infarction)    No recent new symptoms. We will fill out FMLA paperwork for her to allow up to 80 hours monthly as needed so she can minimize her risk of a recurrent event and to help her manage her numerous conditions         I am having Ms. Ogan maintain her freestyle, simvastatin, metoprolol succinate, lisinopril, aspirin EC, gabapentin, Insulin Glargine, metFORMIN, escitalopram, insulin lispro, glucose blood, hydrOXYzine, and diclofenac.  No orders of the defined types were placed in this encounter.     Penni Homans, MD

## 2016-02-22 NOTE — Assessment & Plan Note (Addendum)
Wound clinic is now managing her diabetic ulcer on her right leg. She notes extreme pain when she goes in for debridement and she notes a Toradol shot she received in our office helped her pain greater than any other pain med she has tried. She requests to come in for shot the day of her next debridement and we will set that up. Wound management requires a significant amount of time. Will complete FMLA paperwork for patient

## 2016-02-22 NOTE — Telephone Encounter (Signed)
Pt called because she says that her PCP gives her a pain medication (she's not sure of the name) after she have a weekly lesion removal for the pain. Informed the pt that PCP is out of the office. Pt states that she is in a lot of pain., PCP is aware.   Please assist further.   CB: [phone]

## 2016-02-22 NOTE — Assessment & Plan Note (Signed)
minimize simple carbs. Increase exercise as tolerated. Continue current meds continue Lantus bid.

## 2016-02-22 NOTE — Telephone Encounter (Signed)
FYI: Called patient and spoke with her about medications; informed her provider's option to give her Hydrocodone for breakthrough pain [with Voltaren & Gabapentin]; [facility] and Hydrocodone Rx is being held for her at [facility] 06/16

## 2016-02-25 ENCOUNTER — Ambulatory Visit: Payer: BLUE CROSS/BLUE SHIELD | Admitting: Family Medicine

## 2016-02-25 ENCOUNTER — Other Ambulatory Visit: Payer: Self-pay | Admitting: Medical

## 2016-02-26 ENCOUNTER — Other Ambulatory Visit: Payer: Self-pay | Admitting: Family Medicine

## 2016-02-26 DIAGNOSIS — E11622 Type 2 diabetes mellitus with other skin ulcer: Secondary | ICD-10-CM | POA: Diagnosis not present

## 2016-02-26 NOTE — Telephone Encounter (Signed)
Prescription sent in and patient informed. 

## 2016-02-26 NOTE — Telephone Encounter (Signed)
Faxed hardcopy to [facility] [place].

## 2016-02-26 NOTE — Telephone Encounter (Signed)
°  Relationship to patient: Self Can be reached: [phone]  Pharmacy:  [id number] - [place], [place] - [name] [phone] (Phone) [phone] (Fax)         Reason for call: Request refill on Lyrica

## 2016-02-28 ENCOUNTER — Encounter: Payer: Self-pay | Admitting: Vascular Surgery

## 2016-02-29 ENCOUNTER — Ambulatory Visit (HOSPITAL_COMMUNITY): Payer: BLUE CROSS/BLUE SHIELD

## 2016-02-29 ENCOUNTER — Ambulatory Visit (HOSPITAL_COMMUNITY)
Admission: RE | Admit: 2016-02-29 | Discharge: 2016-02-29 | Disposition: A | Discharge status: Home / Self Care | Payer: BLUE CROSS/BLUE SHIELD | Source: Ambulatory Visit | Attending: Cardiology | Admitting: Cardiology

## 2016-02-29 ENCOUNTER — Other Ambulatory Visit: Payer: Self-pay | Admitting: Internal Medicine

## 2016-02-29 DIAGNOSIS — I739 Peripheral vascular disease, unspecified: Secondary | ICD-10-CM

## 2016-02-29 DIAGNOSIS — E1122 Type 2 diabetes mellitus with diabetic chronic kidney disease: Secondary | ICD-10-CM | POA: Insufficient documentation

## 2016-02-29 DIAGNOSIS — F329 Major depressive disorder, single episode, unspecified: Secondary | ICD-10-CM | POA: Diagnosis not present

## 2016-02-29 DIAGNOSIS — E785 Hyperlipidemia, unspecified: Secondary | ICD-10-CM | POA: Insufficient documentation

## 2016-02-29 DIAGNOSIS — R938 Abnormal findings on diagnostic imaging of other specified body structures: Secondary | ICD-10-CM | POA: Diagnosis not present

## 2016-02-29 DIAGNOSIS — E11622 Type 2 diabetes mellitus with other skin ulcer: Secondary | ICD-10-CM | POA: Diagnosis not present

## 2016-02-29 DIAGNOSIS — L97911 Non-pressure chronic ulcer of unspecified part of right lower leg limited to breakdown of skin: Secondary | ICD-10-CM | POA: Diagnosis not present

## 2016-02-29 DIAGNOSIS — I10 Essential (primary) hypertension: Secondary | ICD-10-CM | POA: Diagnosis not present

## 2016-03-03 ENCOUNTER — Other Ambulatory Visit: Payer: Self-pay | Admitting: Medical

## 2016-03-03 ENCOUNTER — Encounter: Payer: Self-pay | Admitting: Medical

## 2016-03-03 ENCOUNTER — Other Ambulatory Visit: Payer: Self-pay | Admitting: Family Medicine

## 2016-03-03 ENCOUNTER — Ambulatory Visit (INDEPENDENT_AMBULATORY_CARE_PROVIDER_SITE_OTHER): Payer: BLUE CROSS/BLUE SHIELD | Admitting: Medical

## 2016-03-03 VITALS — BP 152/90 | HR 93 | Temp 98.6°F | Ht 62.0 in | Wt 147.0 lb

## 2016-03-03 DIAGNOSIS — I10 Essential (primary) hypertension: Secondary | ICD-10-CM

## 2016-03-03 DIAGNOSIS — L089 Local infection of the skin and subcutaneous tissue, unspecified: Secondary | ICD-10-CM | POA: Diagnosis not present

## 2016-03-03 DIAGNOSIS — M25561 Pain in right knee: Secondary | ICD-10-CM | POA: Diagnosis not present

## 2016-03-03 DIAGNOSIS — L309 Dermatitis, unspecified: Secondary | ICD-10-CM | POA: Diagnosis not present

## 2016-03-03 MED ORDER — INSULIN PEN NEEDLE 32G X 4 MM MISC
Status: DC
Start: 2016-03-03 — End: 2017-05-12

## 2016-03-03 MED ORDER — TRIAMCINOLONE ACETONIDE 0.025 % EX CREA: 1.0000 "application " | TOPICAL_CREAM | Freq: Two times a day (BID) | CUTANEOUS | Status: DC

## 2016-03-03 MED ORDER — AMMONIUM LACTATE 12 % EX LOTN: TOPICAL_LOTION | CUTANEOUS | Status: DC

## 2016-03-03 MED ORDER — HYDROCODONE-ACETAMINOPHEN 5-325 MG PO TABS: 1.0000 | ORAL_TABLET | Freq: Four times a day (QID) | ORAL | Status: DC | PRN

## 2016-03-03 MED FILL — HYDROCODON-APAP 5-325: 5-325 | 5 days supply | Qty: 20 | Fill #0

## 2016-03-03 NOTE — Patient Instructions (Addendum)
For your non healing wound will get cbc and blood cultures.  We will call you on lab results. If you get fever, chills or sweats pending results let us know.  I do think will treat your hands with lac-hydrin and kenalog. Try to reduce number of times you wash your hands. Discussed with Dr. Charlett Blake  For your leg pain, I am refilling norco.  For your htn please get back on your blood pressure medication.  Follow up in 7 days or as needed

## 2016-03-03 NOTE — Progress Notes (Signed)
Pre visit review using our clinic tool,if applicable. No additional management support is needed unless otherwise documented below in the visit note.  

## 2016-03-03 NOTE — Progress Notes (Signed)
Subjective:    [name], female    DOB: May 13, 1966, 50 y.o.   MRN: [id number]  HPI  Pt last Friday had her rt leg wound check on past Friday. Pt had wound debrided the wound. Pt had debridement and wound tissue looks good now. Per pt wound is filling in but margins of the wound are widening.  No fevers, no chills or any sweats.   Pt has special patch or dressing.   Pt describes that specialist was puzzled be her hand appearance(dry appearance to hands with flakiness to skin. He suggested that she might needs blood culture. Margins of wound are getting wider on her right lower ext. He thought the peeling of the skin on hands looked similar to wound edges.   Pt has some hydrocodone use in past for her right leg pain.  Pt has not been taking her bp meds. Her bp is 160/98 today. No cardiac or neurologic signs or symptoms.    Review of Systems  Constitutional: Negative for fever, chills and fatigue.  Respiratory: Negative for cough, chest tightness, shortness of breath and wheezing.   Cardiovascular: Negative for chest pain and palpitations.  Skin: Positive for rash.       Dry skin peeling on hands.  Neurological: Negative for dizziness and headaches.  Hematological: Negative for adenopathy. Does not bruise/bleed easily.  Psychiatric/Behavioral: Negative for behavioral problems and confusion.   Past Medical History  Diagnosis Date  . Hypertension   . Hyperlipidemia 12/06/2010  . Overweight(278.02) 12/06/2010  . NEPHROLITHIASIS, HX OF 10/07/2010  . RESTLESS LEG SYNDROME 10/25/2010  . PERIPHERAL NEUROPATHY, FEET 10/07/2010  . HEART MURMUR, HX OF 10/07/2010  . Essential hypertension, benign 10/07/2010  . DM 10/07/2010  . Disturbance of skin sensation 10/07/2010  . COMMON MIGRAINE 10/07/2010  . Acute bronchitis 07/08/2011  . Diabetes mellitus type II, uncontrolled ([place]) 10/07/2010    Qualifier: Diagnosis of  By: Charlett Blake MD, [name]    . PVD (peripheral vascular disease)  ([place]) 01/21/2012  . SOB (shortness of breath) 04/01/2012  . Depression 12/18/2012  . Weakness of right side of body 04/19/2013  . CVA (cerebral infarction) 04/19/2013  . Stroke [facility])      Social History   Social History  . Marital Status: Single    Spouse Name: N/A  . Number of Children: N/A  . Years of Education: N/A   Occupational History  . Not on file.   Social History Main Topics  . Smoking status: Former Smoker -- 0.50 packs/day    Quit date: 09/08/2005  . Smokeless tobacco: Never Used  . Alcohol Use: No  . Drug Use: No  . Sexual Activity:    Partners: Male   Other Topics Concern  . Not on file   Social History Narrative   Lives with husband in a two story home.  Has one child.     Works as a Glass blower/designer.     Education: high school.    Past Surgical History  Procedure Laterality Date  . None      Family History  Problem Relation Age of Onset  . Arthritis Mother   . Stroke Brother     previous smoker  . Alcohol abuse Brother     in remission  . Leukemia Brother   . Diabetes Paternal Grandmother   . Healthy Son     Allergies  Allergen Reactions  . Tramadol Other (See Comments)    Pt cant tolerate this pain med.   Marland Kitchen  Morphine And Related Nausea And Vomiting    Current Outpatient Prescriptions on File Prior to Visit  Medication Sig Dispense Refill  . aspirin EC 81 MG tablet Take 1 tablet (81 mg total) by mouth 2 (two) times daily. 30 tablet   . escitalopram (LEXAPRO) 10 MG tablet Take half tablet daily x 7 days then increase to one tablet daily. 30 tablet 2  . glucose blood (ONE TOUCH ULTRA TEST) test strip Use once daily to check blood sugar.  DX E11.9 100 each 11  . HYDROcodone-acetaminophen (NORCO/VICODIN) 5-325 MG tablet Take 1 tablet by mouth every 6 (six) hours as needed for moderate pain or severe pain. 10 tablet 0  . hydrOXYzine (ATARAX/VISTARIL) 25 MG tablet Take 1 tablet (25 mg total) by mouth 3 (three) times daily as needed. 30 tablet 0    . Insulin Glargine (LANTUS SOLOSTAR) 100 UNIT/ML Solostar Pen Inject 50 Units into the skin 2 (two) times daily. 15 mL 4  . insulin lispro (HUMALOG) 100 UNIT/ML KiwkPen Inject 0.04 mLs (4 Units total) into the skin 2 (two) times daily as needed (BS>250). (Patient taking differently: Inject 14 Units into the skin 3 (three) times daily. ) 15 mL 3  . Lancets (FREESTYLE) lancets DX- 250.02  Pts machine is Freestyle Freedom Lite 100 each 5  . LYRICA 150 MG capsule TAKE ONE CAPSULE BY MOUTH 3 TIMES A DAY 90 capsule 1  . simvastatin (ZOCOR) 5 MG tablet Take 1 tablet (5 mg total) by mouth daily. 30 tablet 6  . diclofenac (VOLTAREN) 75 MG EC tablet Take 1 tablet (75 mg total) by mouth 2 (two) times daily. 30 tablet 0  . gabapentin (NEURONTIN) 100 MG capsule Take 1 capsule (100 mg total) by mouth daily. (Patient not taking: Reported on 03/03/2016) 30 capsule 5  . lisinopril (PRINIVIL,ZESTRIL) 10 MG tablet Take 1 tablet (10 mg total) by mouth 2 (two) times daily. (Patient not taking: Reported on 03/03/2016) 60 tablet 6  . metFORMIN (GLUCOPHAGE-XR) 500 MG 24 hr tablet Take 2 tablets (1,000 mg total) by mouth 2 (two) times daily. (Patient not taking: Reported on 03/03/2016) 120 tablet 3  . metoprolol succinate (TOPROL-XL) 25 MG 24 hr tablet Take 1 tablet (25 mg total) by mouth daily. (Patient not taking: Reported on 03/03/2016) 30 tablet 6   No current facility-administered medications on file prior to visit.    BP 152/90 mmHg  Pulse 93  Temp(Src) 98.6 F (37 C) (Oral)  Ht 5\' 2"  (1.575 m)  Wt 147 lb (66.679 kg)  BMI 26.88 kg/m2  SpO2 100%  LMP 02/12/2011        Objective:   Physical Exam  General Mental Status- Alert. General Appearance- Not in acute distress.   Skin Hands of skin- dry hands with flaky appearance to skin   Chest and Lung Exam Auscultation: Breath Sounds:-Normal.  Cardiovascular Auscultation:Rythm- Regular. Murmurs & Other Heart Sounds:Auscultation of the heart reveals-  No Murmurs.  Neurologic Cranial Nerve exam:- CN III-XII intact(No nystagmus), symmetric smile. Strength:- 5/5 equal and symmetric strength both upper and lower extremities.  Rt calf- did not remove dressing from wound.      Assessment & Plan:  For your non healing wound will get cbc and blood cultures.  We will call you on lab results. If you get fever, chills or sweats pending results let us know.  I do think will treat your hands with lac-hydrin and kenalog. Try to reduce number of times you wash your hands. Discussed with  Dr. Charlett Blake.  For your leg pain, I am refilling norco.  For your htn please get back on your blood pressure medication.(pt expresses understanding.)  Follow up in 7 days or as needed

## 2016-03-04 ENCOUNTER — Other Ambulatory Visit: Payer: Self-pay

## 2016-03-04 LAB — CBC WITH DIFFERENTIAL/PLATELET
Basophils Absolute: 0.1 10*3/uL (ref 0.0–0.1)
Basophils Relative: 0.9 % (ref 0.0–3.0)
Eosinophils Absolute: 0.3 10*3/uL (ref 0.0–0.7)
Eosinophils Relative: 2.7 % (ref 0.0–5.0)
HCT: 36 % (ref 36.0–46.0)
Hemoglobin: 11.8 g/dL — ABNORMAL LOW (ref 12.0–15.0)
Lymphocytes Relative: 23.5 % (ref 12.0–46.0)
Lymphs Abs: 2.3 10*3/uL (ref 0.7–4.0)
MCHC: 32.7 g/dL (ref 30.0–36.0)
MCV: 86 fl (ref 78.0–100.0)
Monocytes Absolute: 0.7 10*3/uL (ref 0.1–1.0)
Monocytes Relative: 6.7 % (ref 3.0–12.0)
Neutro Abs: 6.5 10*3/uL (ref 1.4–7.7)
Neutrophils Relative %: 66.2 % (ref 43.0–77.0)
Platelets: 421 10*3/uL — ABNORMAL HIGH (ref 150.0–400.0)
RBC: 4.18 Mil/uL (ref 3.87–5.11)
RDW: 13.1 % (ref 11.5–15.5)
WBC: 9.8 10*3/uL (ref 4.0–10.5)

## 2016-03-04 MED ORDER — LISINOPRIL 10 MG PO TABS: ORAL_TABLET | ORAL | Status: DC

## 2016-03-06 ENCOUNTER — Encounter: Payer: Self-pay | Admitting: Vascular Surgery

## 2016-03-06 ENCOUNTER — Ambulatory Visit (INDEPENDENT_AMBULATORY_CARE_PROVIDER_SITE_OTHER): Payer: BLUE CROSS/BLUE SHIELD | Admitting: Vascular Surgery

## 2016-03-06 VITALS — BP 134/84 | HR 96 | Temp 99.1°F | Resp 20 | Ht 62.5 in | Wt 147.0 lb

## 2016-03-06 DIAGNOSIS — T148 Other injury of unspecified body region: Secondary | ICD-10-CM | POA: Diagnosis not present

## 2016-03-06 DIAGNOSIS — T148XXA Other injury of unspecified body region, initial encounter: Secondary | ICD-10-CM

## 2016-03-06 MED ORDER — LEVOFLOXACIN 500 MG PO TABS
500.0000 mg | ORAL_TABLET | Freq: Every day | ORAL | Status: DC
Start: 2016-03-06 — End: 2016-03-28

## 2016-03-06 NOTE — Progress Notes (Signed)
Referring Physician: Dr. Dellia Nims Patient name: [name] MRN: [id number] DOB: 1966-05-21 Sex: female  REASON FOR CONSULT: Nonhealing wound right leg  HPI: [name] is a 50 y.o. female,  referred by Dr. Asa Saunas at the wound care center. The patient developed an ulcer on the right lateral aspect of her leg in February 2017. This began as a trauma or something bumped her right leg. The wound has progressively enlarged over the last few months. She was previously seen by my partner Dr. Bridgett Larsson several years ago for neuropathy. At that time she had normal arterial exam on 2 separate occasions. She denies any claudication or rest pain. She does work actively. She denies any fever or chills. She has had diabetes for several years. She states her glucoses ran anywhere from 120 to 1:30 with the highest of 210 over the last few months. Other medical problems include hypertension, hyperlipidemia both of which are stable.  Past Medical History  Diagnosis Date  . Hypertension   . Hyperlipidemia 12/06/2010  . Overweight(278.02) 12/06/2010  . NEPHROLITHIASIS, HX OF 10/07/2010  . RESTLESS LEG SYNDROME 10/25/2010  . PERIPHERAL NEUROPATHY, FEET 10/07/2010  . HEART MURMUR, HX OF 10/07/2010  . Essential hypertension, benign 10/07/2010  . DM 10/07/2010  . Disturbance of skin sensation 10/07/2010  . COMMON MIGRAINE 10/07/2010  . Acute bronchitis 07/08/2011  . Diabetes mellitus type II, uncontrolled ([place]) 10/07/2010    Qualifier: Diagnosis of  By: Charlett Blake MD, [name]    . PVD (peripheral vascular disease) ([place]) 01/21/2012  . SOB (shortness of breath) 04/01/2012  . Depression 12/18/2012  . Weakness of right side of body 04/19/2013  . CVA (cerebral infarction) 04/19/2013  . Stroke ([place])   . Heart murmur    Past Surgical History  Procedure Laterality Date  . None      Family History  Problem Relation Age of Onset  . Arthritis Mother   . Stroke Brother     previous smoker  . Alcohol abuse Brother     in  remission  . Leukemia Brother   . Diabetes Paternal Grandmother   . Healthy Son     SOCIAL HISTORY: Social History   Social History  . Marital Status: Single    Spouse Name: N/A  . Number of Children: N/A  . Years of Education: N/A   Occupational History  . Not on file.   Social History Main Topics  . Smoking status: Former Smoker -- 0.50 packs/day    Quit date: 09/08/2005  . Smokeless tobacco: Never Used  . Alcohol Use: No  . Drug Use: No  . Sexual Activity:    Partners: Male   Other Topics Concern  . Not on file   Social History Narrative   Lives with husband in a two story home.  Has one child.     Works as a Glass blower/designer.     Education: high school.    Allergies  Allergen Reactions  . Tramadol Other (See Comments)    Pt cant tolerate this pain med.   . Morphine And Related Nausea And Vomiting    Current Outpatient Prescriptions  Medication Sig Dispense Refill  . ammonium lactate (LAC-HYDRIN) 12 % lotion Apply to area twice a day 225 g 0  . aspirin EC 81 MG tablet Take 1 tablet (81 mg total) by mouth 2 (two) times daily. 30 tablet   . diclofenac (VOLTAREN) 75 MG EC tablet Take 1 tablet (75 mg total) by mouth 2 (  two) times daily. 30 tablet 0  . escitalopram (LEXAPRO) 10 MG tablet Take half tablet daily x 7 days then increase to one tablet daily. 30 tablet 2  . gabapentin (NEURONTIN) 100 MG capsule Take 1 capsule (100 mg total) by mouth daily. 30 capsule 5  . glucose blood (ONE TOUCH ULTRA TEST) test strip Use once daily to check blood sugar.  DX E11.9 100 each 11  . HYDROcodone-acetaminophen (NORCO) 5-325 MG tablet Take 1 tablet by mouth every 6 (six) hours as needed for moderate pain. 20 tablet 0  . hydrOXYzine (ATARAX/VISTARIL) 25 MG tablet Take 1 tablet (25 mg total) by mouth 3 (three) times daily as needed. 30 tablet 0  . Insulin Glargine (LANTUS SOLOSTAR) 100 UNIT/ML Solostar Pen Inject 50 Units into the skin 2 (two) times daily. 15 mL 4  . insulin  lispro (HUMALOG) 100 UNIT/ML KiwkPen Inject 0.04 mLs (4 Units total) into the skin 2 (two) times daily as needed (BS>250). (Patient taking differently: Inject 14 Units into the skin 3 (three) times daily. ) 15 mL 3  . Insulin Pen Needle 32G X 4 MM MISC Use as directed for insulin.DX E11.65 100 each 6  . Lancets (FREESTYLE) lancets DX- 250.02  Pts machine is Freestyle Freedom Lite 100 each 5  . lisinopril (PRINIVIL,ZESTRIL) 10 MG tablet TAKE 1 TABLET (10 MG TOTAL) BY MOUTH 2 (TWO) TIMES DAILY. 60 tablet 0  . LYRICA 150 MG capsule TAKE ONE CAPSULE BY MOUTH 3 TIMES A DAY 90 capsule 1  . metFORMIN (GLUCOPHAGE-XR) 500 MG 24 hr tablet Take 2 tablets (1,000 mg total) by mouth 2 (two) times daily. 120 tablet 3  . metoprolol succinate (TOPROL-XL) 25 MG 24 hr tablet Take 1 tablet (25 mg total) by mouth daily. 30 tablet 6  . simvastatin (ZOCOR) 5 MG tablet Take 1 tablet (5 mg total) by mouth daily. 30 tablet 6  . triamcinolone (KENALOG) 0.025 % cream Apply 1 application topically 2 (two) times daily. 30 g 0   No current facility-administered medications for this visit.    ROS:   General:  No weight loss, Fever, chills  HEENT: No recent headaches, no nasal bleeding, no visual changes, no sore throat  Neurologic: No dizziness, blackouts, seizures. No recent symptoms of stroke or mini- stroke. No recent episodes of slurred speech, or temporary blindness.  Cardiac: No recent episodes of chest pain/pressure, no shortness of breath at rest.  No shortness of breath with exertion.  Denies history of atrial fibrillation or irregular heartbeat  Vascular: No history of rest pain in feet.  No history of claudication.  No history of non-healing ulcer, No history of DVT   Pulmonary: No home oxygen, no productive cough, no hemoptysis,  No asthma or wheezing  Musculoskeletal:  [ ]  Arthritis, [ ]  Low back pain,  [ ]  Joint pain  Hematologic:No history of hypercoagulable state.  No history of easy bleeding.  No  history of anemia  Gastrointestinal: No hematochezia or melena,  No gastroesophageal reflux, no trouble swallowing  Urinary: [ ]  chronic Kidney disease, [ ]  on HD - [ ]  MWF or [ ]  TTHS, [ ]  Burning with urination, [ ]  Frequent urination, [ ]  Difficulty urinating;   Skin: No rashes  Psychological: No history of anxiety,  No history of depression   Physical Examination  Filed Vitals:   03/06/16 1146  BP: 134/84  Pulse: 96  Temp: 99.1 F (37.3 C)  TempSrc: Oral  Resp: 20  Height: 5' 2.5" (  1.588 m)  Weight: 147 lb (66.679 kg)    Body mass index is 26.44 kg/(m^2).  General:  Alert and oriented, no acute distress HEENT: Normal Neck: No bruit or JVD Pulmonary: Clear to auscultation bilaterally Cardiac: Regular Rate and Rhythm without murmur Abdomen: Soft, non-tender, non-distended, no mass Skin: No rash, 10 cm x 10 CM by less than 2 mm depth wound lateral aspect right leg down to the lateral malleolus. Multiple areas of granulation tissue mixed with fibrillar and is exudate Extremity Pulses:  2+ radial, brachial, femoral, dorsalis pedis, posterior tibial pulses bilaterally Musculoskeletal: No deformity trace edema laterally  Neurologic: Upper and lower extremity motor 5/5 and symmetric  DATA:  Patient had bilateral ABIs and arterial duplex performed at [facility] vascular lab on June 23. This showed normal ABIs bilaterally 1.1 on the right 1.0 on the left triphasic waveforms throughout both lower extremities with no obvious stenosis seen on duplex exam. Right TBI is 0.6 right 0.9 left   Patient also had a previous venous reflux exam in our office in May of this year. This showed some reflux in the right common femoral greater saphenous vein but vein diameter was only 3-4 mm in the superficial system.  ASSESSMENT:  Venous related ulcer right lower extremity. However previous reflux exam did not show vein diameters large enough that she would be a candidate for laser  ablation.   PLAN:  Would recommend switching her to a compressive type dressing to improve overall wound healing. We placed her in an Haematologist today. She has follow-up with Dr. Asa Saunas tomorrow for further consideration of this. I also placed her on a 10 day course of Levaquin to try to decrease bacterial colonization of the wound. She will follow-up with [name] on as-needed basis.   Ruta Hinds, MD [facility] Office: [phone] Pager: [phone]

## 2016-03-07 DIAGNOSIS — E11622 Type 2 diabetes mellitus with other skin ulcer: Secondary | ICD-10-CM | POA: Diagnosis not present

## 2016-03-10 LAB — CULTURE, BLOOD (SINGLE)
Organism ID, Bacteria: NO GROWTH
Organism ID, Bacteria: NO GROWTH

## 2016-03-12 ENCOUNTER — Other Ambulatory Visit: Payer: Self-pay | Admitting: Family Medicine

## 2016-03-12 MED ORDER — HYDROCODONE-ACETAMINOPHEN 5-325 MG PO TABS: 1.0000 | ORAL_TABLET | Freq: Four times a day (QID) | ORAL | Status: DC | PRN

## 2016-03-12 NOTE — Telephone Encounter (Signed)
She is having a great deal of pain due to debridements of diabetic ulcer, am willing to refill the hydrocodone, same strength, same sig, #40 will sign on 03/13/2016

## 2016-03-12 NOTE — Telephone Encounter (Signed)
Pt is requesting refill on Hydrocodone. Last Filled by Percell Miller.  Last OV: 02/12/2016 Last Fill: 03/03/2016 #20 and 0RF Pt sig: 1 tab q6h prn UDS: None  Please advise.

## 2016-03-12 NOTE — Telephone Encounter (Signed)
°  Relationship to patient: Self Can be reached: [phone]    Reason for call: Request refill on HYDROcodone-acetaminophen (NORCO) 5-325 MG tablet [id number]

## 2016-03-12 NOTE — Telephone Encounter (Signed)
Rx printed, awaiting MD signature.  

## 2016-03-13 MED FILL — HYDROCODON-APAP 5-325: 5-325 | 10 days supply | Qty: 40 | Fill #0

## 2016-03-13 NOTE — Telephone Encounter (Signed)
Patient informed to pickup hardcopy at the front desk. 

## 2016-03-14 ENCOUNTER — Encounter (HOSPITAL_BASED_OUTPATIENT_CLINIC_OR_DEPARTMENT_OTHER): Payer: BLUE CROSS/BLUE SHIELD | Attending: Internal Medicine

## 2016-03-14 DIAGNOSIS — E1140 Type 2 diabetes mellitus with diabetic neuropathy, unspecified: Secondary | ICD-10-CM | POA: Diagnosis not present

## 2016-03-14 DIAGNOSIS — I10 Essential (primary) hypertension: Secondary | ICD-10-CM | POA: Insufficient documentation

## 2016-03-14 DIAGNOSIS — L97811 Non-pressure chronic ulcer of other part of right lower leg limited to breakdown of skin: Secondary | ICD-10-CM | POA: Insufficient documentation

## 2016-03-14 DIAGNOSIS — E11622 Type 2 diabetes mellitus with other skin ulcer: Secondary | ICD-10-CM | POA: Insufficient documentation

## 2016-03-18 DIAGNOSIS — E11622 Type 2 diabetes mellitus with other skin ulcer: Secondary | ICD-10-CM | POA: Diagnosis not present

## 2016-03-21 DIAGNOSIS — E11622 Type 2 diabetes mellitus with other skin ulcer: Secondary | ICD-10-CM | POA: Diagnosis not present

## 2016-03-25 ENCOUNTER — Telehealth: Payer: Self-pay | Admitting: Family Medicine

## 2016-03-25 DIAGNOSIS — E11622 Type 2 diabetes mellitus with other skin ulcer: Secondary | ICD-10-CM | POA: Diagnosis not present

## 2016-03-25 NOTE — Telephone Encounter (Signed)
Pt dropped off FMLA paperwork for Dr. Charlett Blake to complete, documents were placed in [place] at front office

## 2016-03-28 ENCOUNTER — Ambulatory Visit (INDEPENDENT_AMBULATORY_CARE_PROVIDER_SITE_OTHER): Payer: BLUE CROSS/BLUE SHIELD | Admitting: Medical

## 2016-03-28 ENCOUNTER — Encounter: Payer: Self-pay | Admitting: Medical

## 2016-03-28 ENCOUNTER — Emergency Department (HOSPITAL_BASED_OUTPATIENT_CLINIC_OR_DEPARTMENT_OTHER)
Admission: EM | Admit: 2016-03-28 | Discharge: 2016-03-28 | Disposition: A | Discharge status: Home / Self Care | Payer: BLUE CROSS/BLUE SHIELD | Attending: Emergency Medicine | Admitting: Emergency Medicine

## 2016-03-28 ENCOUNTER — Encounter (HOSPITAL_BASED_OUTPATIENT_CLINIC_OR_DEPARTMENT_OTHER): Payer: Self-pay | Admitting: Emergency Medicine

## 2016-03-28 ENCOUNTER — Emergency Department (HOSPITAL_BASED_OUTPATIENT_CLINIC_OR_DEPARTMENT_OTHER): Payer: BLUE CROSS/BLUE SHIELD

## 2016-03-28 VITALS — BP 130/84 | HR 95 | Temp 98.1°F | Ht 62.0 in | Wt 141.8 lb

## 2016-03-28 DIAGNOSIS — R112 Nausea with vomiting, unspecified: Secondary | ICD-10-CM | POA: Diagnosis present

## 2016-03-28 DIAGNOSIS — E119 Type 2 diabetes mellitus without complications: Secondary | ICD-10-CM | POA: Diagnosis not present

## 2016-03-28 DIAGNOSIS — R1114 Bilious vomiting: Secondary | ICD-10-CM

## 2016-03-28 DIAGNOSIS — Z794 Long term (current) use of insulin: Secondary | ICD-10-CM | POA: Insufficient documentation

## 2016-03-28 DIAGNOSIS — Z87891 Personal history of nicotine dependence: Secondary | ICD-10-CM | POA: Insufficient documentation

## 2016-03-28 DIAGNOSIS — E11622 Type 2 diabetes mellitus with other skin ulcer: Secondary | ICD-10-CM | POA: Diagnosis not present

## 2016-03-28 DIAGNOSIS — Z7982 Long term (current) use of aspirin: Secondary | ICD-10-CM | POA: Diagnosis not present

## 2016-03-28 DIAGNOSIS — F329 Major depressive disorder, single episode, unspecified: Secondary | ICD-10-CM | POA: Insufficient documentation

## 2016-03-28 DIAGNOSIS — Z79899 Other long term (current) drug therapy: Secondary | ICD-10-CM | POA: Diagnosis not present

## 2016-03-28 DIAGNOSIS — R197 Diarrhea, unspecified: Secondary | ICD-10-CM | POA: Insufficient documentation

## 2016-03-28 DIAGNOSIS — E785 Hyperlipidemia, unspecified: Secondary | ICD-10-CM | POA: Insufficient documentation

## 2016-03-28 DIAGNOSIS — Z7984 Long term (current) use of oral hypoglycemic drugs: Secondary | ICD-10-CM | POA: Diagnosis not present

## 2016-03-28 DIAGNOSIS — Z8673 Personal history of transient ischemic attack (TIA), and cerebral infarction without residual deficits: Secondary | ICD-10-CM | POA: Insufficient documentation

## 2016-03-28 DIAGNOSIS — I10 Essential (primary) hypertension: Secondary | ICD-10-CM | POA: Diagnosis not present

## 2016-03-28 LAB — COMPREHENSIVE METABOLIC PANEL
ALT: 16 U/L (ref 14–54)
AST: 15 U/L (ref 15–41)
Albumin: 3.3 g/dL — ABNORMAL LOW (ref 3.5–5.0)
Alkaline Phosphatase: 98 U/L (ref 38–126)
Anion gap: 8 (ref 5–15)
BUN: 21 mg/dL — ABNORMAL HIGH (ref 6–20)
CO2: 28 mmol/L (ref 22–32)
Calcium: 9 mg/dL (ref 8.9–10.3)
Chloride: 98 mmol/L — ABNORMAL LOW (ref 101–111)
Creatinine, Ser: 0.67 mg/dL (ref 0.44–1.00)
GFR calc Af Amer: >60 mL/min (ref >60)
GFR calc non Af Amer: >60 mL/min (ref >60)
Glucose, Bld: 309 mg/dL — ABNORMAL HIGH (ref 65–99)
Potassium: 4.1 mmol/L (ref 3.5–5.1)
Sodium: 134 mmol/L — ABNORMAL LOW (ref 135–145)
Total Bilirubin: 0.5 mg/dL (ref 0.3–1.2)
Total Protein: 7.8 g/dL (ref 6.5–8.1)

## 2016-03-28 LAB — CBC WITH DIFFERENTIAL/PLATELET
Basophils Absolute: 0 10*3/uL (ref 0.0–0.1)
Basophils Relative: 0 %
Eosinophils Absolute: 0.2 10*3/uL (ref 0.0–0.7)
Eosinophils Relative: 2 %
HCT: 37.3 % (ref 36.0–46.0)
Hemoglobin: 12.5 g/dL (ref 12.0–15.0)
Lymphocytes Relative: 22 %
Lymphs Abs: 1.8 10*3/uL (ref 0.7–4.0)
MCH: 28 pg (ref 26.0–34.0)
MCHC: 33.5 g/dL (ref 30.0–36.0)
MCV: 83.6 fL (ref 78.0–100.0)
Monocytes Absolute: 0.8 10*3/uL (ref 0.1–1.0)
Monocytes Relative: 10 %
Neutro Abs: 5.4 10*3/uL (ref 1.7–7.7)
Neutrophils Relative %: 66 %
Platelets: 505 10*3/uL — ABNORMAL HIGH (ref 150–400)
RBC: 4.46 MIL/uL (ref 3.87–5.11)
RDW: 12.7 % (ref 11.5–15.5)
WBC: 8.2 10*3/uL (ref 4.0–10.5)

## 2016-03-28 LAB — URINALYSIS, ROUTINE W REFLEX MICROSCOPIC
Bilirubin Urine: NEGATIVE
Glucose, UA: >1000 mg/dL — AB
Ketones, ur: NEGATIVE mg/dL
Leukocytes, UA: NEGATIVE
Nitrite: NEGATIVE
Protein, ur: 30 mg/dL — AB
Specific Gravity, Urine: 1.038 — ABNORMAL HIGH (ref 1.005–1.030)
pH: 5 (ref 5.0–8.0)

## 2016-03-28 LAB — URINE MICROSCOPIC-ADD ON

## 2016-03-28 LAB — LIPASE, BLOOD: Lipase: 25 U/L (ref 11–51)

## 2016-03-28 MED ORDER — SODIUM CHLORIDE 0.9 % IV BOLUS (SEPSIS)
1000.0000 mL | Freq: Once | INTRAVENOUS | Status: AC
Start: 2016-03-28 — End: 2016-03-28
  Administered 2016-03-28: 1000 mL via INTRAVENOUS

## 2016-03-28 MED ORDER — ONDANSETRON 4 MG PO TBDP: ORAL_TABLET | ORAL | Status: DC

## 2016-03-28 MED ORDER — METOCLOPRAMIDE HCL 10 MG PO TABS: 10.0000 mg | ORAL_TABLET | Freq: Three times a day (TID) | ORAL | Status: DC | PRN

## 2016-03-28 MED ORDER — ONDANSETRON 4 MG PO TBDP
4.0000 mg | ORAL_TABLET | Freq: Once | ORAL | Status: AC
  Administered 2016-03-28: 4 mg via ORAL
  Filled 2016-03-28: qty 1

## 2016-03-28 NOTE — ED Notes (Signed)
Pt sts she has been "vomiting up green stuff" for a year about once or twice a month.  This week it has been happening every morning and she is also now having diarrhea with it. Saw PMD for it once in the past year but no tx given.

## 2016-03-28 NOTE — ED Notes (Signed)
MD at bedside. 

## 2016-03-28 NOTE — Discharge Instructions (Signed)

## 2016-03-28 NOTE — Progress Notes (Signed)
   Subjective:    [name], female    DOB: 17-Feb-1966, 50 y.o.   MRN: [id number]  HPI  Pt in for report of some vomiting the past 2 days. She has been vomiting about 10 times at least. She has decreased appetite. She described light green vomitus. Last ate 10 am. Pt not reporting any constant abdomen pain. But then states 3 days ago had some rt lower quadrant pain that would come and go throughout the day. But last 2 days no pain. Over last year on and off rare green colored vomitus.   Pt notes recently at onset of urination mild pain to start flow.      Review of Systems  Constitutional: Negative for fever, chills and fatigue.  Respiratory: Negative for cough, chest tightness, shortness of breath and wheezing.   Cardiovascular: Negative for chest pain and palpitations.  Gastrointestinal: Positive for abdominal pain. Negative for abdominal distention.       See hpi.  Genitourinary: Negative for dysuria, urgency, frequency, decreased urine volume, difficulty urinating, vaginal pain and pelvic pain.  Musculoskeletal: Negative for back pain.  Skin: Negative for rash.  Neurological: Negative for dizziness and headaches.  Hematological: Negative for adenopathy. Does not bruise/bleed easily.  Psychiatric/Behavioral: Negative for behavioral problems and confusion.       Objective:   Physical Exam  General Appearance- Not in acute distress.  HEENT Eyes- Scleraeral/Conjuntiva-bilat- Not Yellow.   Chest and Lung Exam Auscultation: Breath sounds:-Normal. Adventitious sounds:- No Adventitious sounds.  Cardiovascular Auscultation:Rythm - Regular. Heart Sounds -Normal heart sounds.  Abdomen Inspection:-Inspection Normal.  Palpation/Perucssion: Palpation and Percussion of the abdomen reveal- I thought very faint ruq Tender(pt winced just a little but then states no pain), No Rebound tenderness, No rigidity(Guarding) and No Palpable abdominal masses.  Liver:-Normal.    Spleen:- Normal.   Back- no cva tenderness.  Skin- normal color.      Assessment & Plan:  For your recent bilious vomiting and rt lower quadrant pain 3 days ago, I do think it is best to be seen in ED today as we approach the weekend. They can do [name] and labs if they determine studies are necessary.  I have talked with ED MD working now and they now you are coming down now.  Follow up with [name] as recommended by ED or as needed   [place], Percell Miller, PA-C

## 2016-03-28 NOTE — Patient Instructions (Signed)
For your recent bilious vomiting and rt lower quadrant pain 3 days ago, I do think it is best to be seen in ED today as we approach the weekend. They can do [name] and labs if they determine studies are necessary.  I have talked with ED MD working now and they now you are coming down now.  Follow up with [name] as recommended by ED or as needed

## 2016-03-28 NOTE — Progress Notes (Signed)
Pre visit review using our clinic review tool, if applicable. No additional management support is needed unless otherwise documented below in the visit note. 

## 2016-03-28 NOTE — ED Provider Notes (Signed)
CSN: [id number]     Arrival date & time 03/28/16  1720 History  By signing my name below, I, Evelene Croon, attest that this documentation has been prepared under the direction and in the presence of Malvin Johns, MD . Electronically Signed: Evelene Croon, Scribe. 03/28/2016. 6:41 PM.    Chief Complaint  Patient presents with  . Emesis    The history is provided by the patient. No language interpreter was used.   HPI Comments:  [name] is a 50 y.o. female with a history of DM,  who presents to the Emergency Department complaining of nausea and vomiting x 2 days. Pt states she only vomits in the morning before she is able to eat and describes her vomit as "green stuff". Pt reports h/o the same intermittently x ~1 year but notes never with this frequency. She also reports associated diarrhea for 2 days. Pt denies h/o GERD and h/o abdominal surgeries. She also denies abdominal pain, fever, dysuria, cough, SOB, CP, and unexpected weight loss. Pt saw her PCP today and informed them of her current symptoms; notes they sent off bloodwork. No alleviating factors noted.   Past Medical History  Diagnosis Date  . Hypertension   . Hyperlipidemia 12/06/2010  . Overweight(278.02) 12/06/2010  . NEPHROLITHIASIS, HX OF 10/07/2010  . RESTLESS LEG SYNDROME 10/25/2010  . PERIPHERAL NEUROPATHY, FEET 10/07/2010  . HEART MURMUR, HX OF 10/07/2010  . Essential hypertension, benign 10/07/2010  . DM 10/07/2010  . Disturbance of skin sensation 10/07/2010  . COMMON MIGRAINE 10/07/2010  . Acute bronchitis 07/08/2011  . Diabetes mellitus type II, uncontrolled ([place]) 10/07/2010    Qualifier: Diagnosis of  By: Charlett Blake MD, [name]    . PVD (peripheral vascular disease) ([place]) 01/21/2012  . SOB (shortness of breath) 04/01/2012  . Depression 12/18/2012  . Weakness of right side of body 04/19/2013  . CVA (cerebral infarction) 04/19/2013  . Stroke ([place])   . Heart murmur    Past Surgical History  Procedure Laterality Date   . None     Family History  Problem Relation Age of Onset  . Arthritis Mother   . Stroke Brother     previous smoker  . Alcohol abuse Brother     in remission  . Leukemia Brother   . Diabetes Paternal Grandmother   . Healthy Son    Social History  Substance Use Topics  . Smoking status: Former Smoker -- 0.50 packs/day    Quit date: 09/08/2005  . Smokeless tobacco: Never Used  . Alcohol Use: No   OB History    No data available     Review of Systems  Constitutional: Negative for fever and unexpected weight change.  Respiratory: Negative for cough and shortness of breath.   Cardiovascular: Negative for chest pain.  Gastrointestinal: Positive for nausea, vomiting and diarrhea. Negative for abdominal pain.  Genitourinary: Negative for dysuria.  All other systems reviewed and are negative.  Allergies  Tramadol and Morphine and related  Home Medications   Prior to Admission medications   Medication Sig Start Date End Date Taking? Authorizing Provider  ammonium lactate (LAC-HYDRIN) 12 % lotion Apply to area twice a day 03/03/16   Mackie Pai, PA-C  aspirin EC 81 MG tablet Take 1 tablet (81 mg total) by mouth 2 (two) times daily. 01/01/15   Mosie Lukes, MD  diclofenac (VOLTAREN) 75 MG EC tablet Take 1 tablet (75 mg total) by mouth 2 (two) times daily. 02/22/16   Brunetta Jeans,  PA-C  escitalopram (LEXAPRO) 10 MG tablet Take half tablet daily x 7 days then increase to one tablet daily. 10/09/15   Mosie Lukes, MD  gabapentin (NEURONTIN) 100 MG capsule Take 1 capsule (100 mg total) by mouth daily. 08/10/15   Mosie Lukes, MD  glucose blood (ONE TOUCH ULTRA TEST) test strip Use once daily to check blood sugar.  DX E11.9 01/04/16   Mosie Lukes, MD  HYDROcodone-acetaminophen (NORCO) 5-325 MG tablet Take 1 tablet by mouth every 6 (six) hours as needed for moderate pain. 03/12/16   Mosie Lukes, MD  hydrOXYzine (ATARAX/VISTARIL) 25 MG tablet Take 1 tablet (25 mg total) by  mouth 3 (three) times daily as needed. 02/07/16   Percell Miller Saguier, PA-C  Insulin Glargine (LANTUS SOLOSTAR) 100 UNIT/ML Solostar Pen Inject 50 Units into the skin 2 (two) times daily. 10/09/15   Mosie Lukes, MD  insulin lispro (HUMALOG) 100 UNIT/ML KiwkPen Inject 0.04 mLs (4 Units total) into the skin 2 (two) times daily as needed (BS>250). Patient taking differently: Inject 14 Units into the skin 3 (three) times daily.  10/09/15   Mosie Lukes, MD  Insulin Pen Needle 32G X 4 MM MISC Use as directed for insulin.DX E11.65 03/03/16   Mosie Lukes, MD  Lancets (FREESTYLE) lancets DX- 250.02  Pts machine is Freestyle Freedom Lite 04/20/13   Mosie Lukes, MD  lisinopril (PRINIVIL,ZESTRIL) 10 MG tablet TAKE 1 TABLET (10 MG TOTAL) BY MOUTH 2 (TWO) TIMES DAILY. 03/04/16   Edward Saguier, PA-C  LYRICA 150 MG capsule TAKE ONE CAPSULE BY MOUTH 3 TIMES A DAY 02/26/16   Mosie Lukes, MD  metFORMIN (GLUCOPHAGE-XR) 500 MG 24 hr tablet Take 2 tablets (1,000 mg total) by mouth 2 (two) times daily. 10/09/15   Mosie Lukes, MD  metoprolol succinate (TOPROL-XL) 25 MG 24 hr tablet Take 1 tablet (25 mg total) by mouth daily. 01/01/15   Mosie Lukes, MD  ondansetron (ZOFRAN ODT) 4 MG disintegrating tablet 4mg  ODT q4 hours prn nausea/vomit 03/28/16   Malvin Johns, MD  simvastatin (ZOCOR) 5 MG tablet Take 1 tablet (5 mg total) by mouth daily. 01/01/15   Mosie Lukes, MD  triamcinolone (KENALOG) 0.025 % cream Apply 1 application topically 2 (two) times daily. 03/03/16   Edward Saguier, PA-C   BP 158/96 mmHg  Pulse 94  Temp(Src) 98.6 F (37 C) (Oral)  Resp 16  Ht 5\' 2"  (1.575 m)  Wt 146 lb (66.225 kg)  BMI 26.70 kg/m2  SpO2 99%  LMP 02/12/2011 Physical Exam  Constitutional: She is oriented to person, place, and time. She appears well-developed and well-nourished. No distress.  HENT:  Head: Normocephalic and atraumatic.  Eyes: EOM are normal.  Neck: Normal range of motion.  Cardiovascular: Normal rate,  regular rhythm and normal heart sounds.   Pulmonary/Chest: Effort normal and breath sounds normal.  Abdominal: Soft. She exhibits no distension. There is no tenderness.  Musculoskeletal: Normal range of motion.  Neurological: She is alert and oriented to person, place, and time.  Skin: Skin is warm and dry.  RLE in wound dressing.   Psychiatric: She has a normal mood and affect. Judgment normal.  Nursing note and vitals reviewed.   ED Course  Procedures   DIAGNOSTIC STUDIES:  Oxygen Saturation is 100% on RA, normal by my interpretation.    COORDINATION OF CARE:  6:11 PM Pt updated with UA results. Discussed treatment plan with pt at bedside and pt  agreed to plan.  Labs Review   Results for orders placed or performed during the hospital encounter of 03/28/16  Urinalysis, Routine w reflex microscopic  Result Value Ref Range   Color, Urine YELLOW YELLOW   APPearance CLOUDY (A) CLEAR   Specific Gravity, Urine 1.038 (H) 1.005 - 1.030   pH 5.0 5.0 - 8.0   Glucose, UA >1000 (A) NEGATIVE mg/dL   Hgb urine dipstick SMALL (A) NEGATIVE   Bilirubin Urine NEGATIVE NEGATIVE   Ketones, ur NEGATIVE NEGATIVE mg/dL   Protein, ur 30 (A) NEGATIVE mg/dL   Nitrite NEGATIVE NEGATIVE   Leukocytes, UA NEGATIVE NEGATIVE  Urine microscopic-add on  Result Value Ref Range   Squamous Epithelial / LPF 0-5 (A) NONE SEEN   WBC, UA 0-5 0 - 5 WBC/hpf   RBC / HPF 0-5 0 - 5 RBC/hpf   Bacteria, UA FEW (A) NONE SEEN   Casts HYALINE CASTS (A) NEGATIVE   Urine-Other MUCOUS PRESENT   Comprehensive metabolic panel  Result Value Ref Range   Sodium 134 (L) 135 - 145 mmol/L   Potassium 4.1 3.5 - 5.1 mmol/L   Chloride 98 (L) 101 - 111 mmol/L   CO2 28 22 - 32 mmol/L   Glucose, Bld 309 (H) 65 - 99 mg/dL   BUN 21 (H) 6 - 20 mg/dL   Creatinine, Ser 0.67 0.44 - 1.00 mg/dL   Calcium 9.0 8.9 - 10.3 mg/dL   Total Protein 7.8 6.5 - 8.1 g/dL   Albumin 3.3 (L) 3.5 - 5.0 g/dL   AST 15 15 - 41 U/L   ALT 16 14 - 54  U/L   Alkaline Phosphatase 98 38 - 126 U/L   Total Bilirubin 0.5 0.3 - 1.2 mg/dL   GFR calc non Af Amer >60 >60 mL/min   GFR calc Af Amer >60 >60 mL/min   Anion gap 8 5 - 15  Lipase, blood  Result Value Ref Range   Lipase 25 11 - 51 U/L  CBC with Differential  Result Value Ref Range   WBC 8.2 4.0 - 10.5 K/uL   RBC 4.46 3.87 - 5.11 MIL/uL   Hemoglobin 12.5 12.0 - 15.0 g/dL   HCT 37.3 36.0 - 46.0 %   MCV 83.6 78.0 - 100.0 fL   MCH 28.0 26.0 - 34.0 pg   MCHC 33.5 30.0 - 36.0 g/dL   RDW 12.7 11.5 - 15.5 %   Platelets 505 (H) 150 - 400 K/uL   Neutrophils Relative % 66 %   Neutro Abs 5.4 1.7 - 7.7 K/uL   Lymphocytes Relative 22 %   Lymphs Abs 1.8 0.7 - 4.0 K/uL   Monocytes Relative 10 %   Monocytes Absolute 0.8 0.1 - 1.0 K/uL   Eosinophils Relative 2 %   Eosinophils Absolute 0.2 0.0 - 0.7 K/uL   Basophils Relative 0 %   Basophils Absolute 0.0 0.0 - 0.1 K/uL   US Abdomen Complete  03/28/2016  CLINICAL DATA:  Patient with nausea, vomiting and diarrhea. EXAM: ABDOMEN ULTRASOUND COMPLETE COMPARISON:  None. FINDINGS: Gallbladder: No gallstones or wall thickening visualized. No sonographic Murphy sign noted by sonographer. Common bile duct: Diameter: 3 mm Liver: No focal lesion identified. Within normal limits in parenchymal echogenicity. IVC: No abnormality visualized. Pancreas: Visualized portion unremarkable. Spleen: Size and appearance within normal limits. Right Kidney: Length: 11.7 cm. Echogenicity within normal limits. No mass or hydronephrosis visualized. Left Kidney: Length: 11.7 cm. Echogenicity within normal limits. No mass or hydronephrosis visualized. Abdominal  aorta: No aneurysm visualized. Other findings: None. IMPRESSION: Unremarkable abdominal ultrasound. No cholelithiasis or sonographic evidence for acute cholecystitis. No hydronephrosis. Electronically Signed   By: Lovey Newcomer M.D.   On: 03/28/2016 19:58    I have personally reviewed and evaluated these images and lab  results as part of my medical decision-making.   MDM   Final diagnoses:  Non-intractable vomiting with nausea, vomiting of unspecified type    Patient's labs are non-concerning. There is no evidence of hepatitis. No evidence of pancreatitis. No evidence of gallbladder disease. She was given doses Zofran and is feeling much better. She's tolerating by mouth fluids. She was discharged home in good condition. She was given a prescription for Reglan to use at home. She may have some degree of gastroparesis. She may need GI follow-up. She was encouraged to follow-up with her PCP for recheck or return here as needed for any worsening symptoms.  I personally performed the services described in this documentation, which was scribed in my presence.  The recorded information has been reviewed and considered.    Malvin Johns, MD 03/28/16 2123

## 2016-03-30 ENCOUNTER — Encounter (HOSPITAL_COMMUNITY): Payer: Self-pay | Admitting: Emergency Medicine

## 2016-03-30 ENCOUNTER — Emergency Department (HOSPITAL_COMMUNITY): Payer: BLUE CROSS/BLUE SHIELD

## 2016-03-30 ENCOUNTER — Inpatient Hospital Stay (HOSPITAL_COMMUNITY)
Admission: EM | Admit: 2016-03-30 | Discharge: 2016-04-01 | DRG: 065 | Disposition: A | Discharge status: Home / Self Care | Payer: BLUE CROSS/BLUE SHIELD | Source: Ambulatory Visit | Attending: Neurology | Admitting: Neurology

## 2016-03-30 DIAGNOSIS — I611 Nontraumatic intracerebral hemorrhage in hemisphere, cortical: Secondary | ICD-10-CM | POA: Diagnosis not present

## 2016-03-30 DIAGNOSIS — I619 Nontraumatic intracerebral hemorrhage, unspecified: Secondary | ICD-10-CM | POA: Diagnosis present

## 2016-03-30 DIAGNOSIS — G2581 Restless legs syndrome: Secondary | ICD-10-CM | POA: Diagnosis present

## 2016-03-30 DIAGNOSIS — L98499 Non-pressure chronic ulcer of skin of other sites with unspecified severity: Secondary | ICD-10-CM | POA: Diagnosis present

## 2016-03-30 DIAGNOSIS — Z888 Allergy status to other drugs, medicaments and biological substances status: Secondary | ICD-10-CM | POA: Diagnosis not present

## 2016-03-30 DIAGNOSIS — R270 Ataxia, unspecified: Secondary | ICD-10-CM | POA: Diagnosis present

## 2016-03-30 DIAGNOSIS — S81802A Unspecified open wound, left lower leg, initial encounter: Secondary | ICD-10-CM | POA: Diagnosis present

## 2016-03-30 DIAGNOSIS — E11622 Type 2 diabetes mellitus with other skin ulcer: Secondary | ICD-10-CM | POA: Diagnosis present

## 2016-03-30 DIAGNOSIS — Z833 Family history of diabetes mellitus: Secondary | ICD-10-CM

## 2016-03-30 DIAGNOSIS — Z885 Allergy status to narcotic agent status: Secondary | ICD-10-CM

## 2016-03-30 DIAGNOSIS — F329 Major depressive disorder, single episode, unspecified: Secondary | ICD-10-CM | POA: Diagnosis present

## 2016-03-30 DIAGNOSIS — E663 Overweight: Secondary | ICD-10-CM | POA: Diagnosis present

## 2016-03-30 DIAGNOSIS — I639 Cerebral infarction, unspecified: Secondary | ICD-10-CM

## 2016-03-30 DIAGNOSIS — I610 Nontraumatic intracerebral hemorrhage in hemisphere, subcortical: Secondary | ICD-10-CM | POA: Diagnosis not present

## 2016-03-30 DIAGNOSIS — G43009 Migraine without aura, not intractable, without status migrainosus: Secondary | ICD-10-CM | POA: Diagnosis present

## 2016-03-30 DIAGNOSIS — E1165 Type 2 diabetes mellitus with hyperglycemia: Secondary | ICD-10-CM | POA: Diagnosis present

## 2016-03-30 DIAGNOSIS — I161 Hypertensive emergency: Secondary | ICD-10-CM | POA: Diagnosis present

## 2016-03-30 DIAGNOSIS — R112 Nausea with vomiting, unspecified: Secondary | ICD-10-CM | POA: Diagnosis present

## 2016-03-30 DIAGNOSIS — Z823 Family history of stroke: Secondary | ICD-10-CM | POA: Diagnosis not present

## 2016-03-30 DIAGNOSIS — E785 Hyperlipidemia, unspecified: Secondary | ICD-10-CM | POA: Diagnosis present

## 2016-03-30 DIAGNOSIS — Z87891 Personal history of nicotine dependence: Secondary | ICD-10-CM | POA: Diagnosis not present

## 2016-03-30 DIAGNOSIS — I6789 Other cerebrovascular disease: Secondary | ICD-10-CM | POA: Diagnosis not present

## 2016-03-30 DIAGNOSIS — T148XXA Other injury of unspecified body region, initial encounter: Secondary | ICD-10-CM

## 2016-03-30 DIAGNOSIS — Z8673 Personal history of transient ischemic attack (TIA), and cerebral infarction without residual deficits: Secondary | ICD-10-CM | POA: Diagnosis not present

## 2016-03-30 DIAGNOSIS — I10 Essential (primary) hypertension: Secondary | ICD-10-CM | POA: Diagnosis present

## 2016-03-30 DIAGNOSIS — E1151 Type 2 diabetes mellitus with diabetic peripheral angiopathy without gangrene: Secondary | ICD-10-CM | POA: Diagnosis present

## 2016-03-30 DIAGNOSIS — E119 Type 2 diabetes mellitus without complications: Secondary | ICD-10-CM | POA: Diagnosis present

## 2016-03-30 LAB — CBC
HCT: 35.3 % — ABNORMAL LOW (ref 36.0–46.0)
Hemoglobin: 11.6 g/dL — ABNORMAL LOW (ref 12.0–15.0)
MCH: 27.2 pg (ref 26.0–34.0)
MCHC: 32.9 g/dL (ref 30.0–36.0)
MCV: 82.9 fL (ref 78.0–100.0)
Platelets: 434 10*3/uL — ABNORMAL HIGH (ref 150–400)
RBC: 4.26 MIL/uL (ref 3.87–5.11)
RDW: 12.6 % (ref 11.5–15.5)
WBC: 9.2 10*3/uL (ref 4.0–10.5)

## 2016-03-30 LAB — I-STAT CHEM 8, ED
BUN: 17 mg/dL (ref 6–20)
Calcium, Ion: 1.1 mmol/L — ABNORMAL LOW (ref 1.13–1.30)
Chloride: 101 mmol/L (ref 101–111)
Creatinine, Ser: 0.5 mg/dL (ref 0.44–1.00)
Glucose, Bld: 209 mg/dL — ABNORMAL HIGH (ref 65–99)
HCT: 35 % — ABNORMAL LOW (ref 36.0–46.0)
Hemoglobin: 11.9 g/dL — ABNORMAL LOW (ref 12.0–15.0)
Potassium: 3.8 mmol/L (ref 3.5–5.1)
Sodium: 139 mmol/L (ref 135–145)
TCO2: 25 mmol/L (ref 0–100)

## 2016-03-30 LAB — COMPREHENSIVE METABOLIC PANEL
ALT: 14 U/L (ref 14–54)
ALT: 15 U/L (ref 14–54)
AST: 15 U/L (ref 15–41)
AST: 16 U/L (ref 15–41)
Albumin: 3.1 g/dL — ABNORMAL LOW (ref 3.5–5.0)
Albumin: 3 g/dL — ABNORMAL LOW (ref 3.5–5.0)
Alkaline Phosphatase: 90 U/L (ref 38–126)
Alkaline Phosphatase: 86 U/L (ref 38–126)
Anion gap: 8 (ref 5–15)
Anion gap: 6 (ref 5–15)
BUN: 15 mg/dL (ref 6–20)
BUN: 13 mg/dL (ref 6–20)
CO2: 24 mmol/L (ref 22–32)
CO2: 27 mmol/L (ref 22–32)
Calcium: 9.1 mg/dL (ref 8.9–10.3)
Calcium: 8.9 mg/dL (ref 8.9–10.3)
Chloride: 104 mmol/L (ref 101–111)
Chloride: 103 mmol/L (ref 101–111)
Creatinine, Ser: 0.59 mg/dL (ref 0.44–1.00)
Creatinine, Ser: 0.52 mg/dL (ref 0.44–1.00)
GFR calc Af Amer: >60 mL/min (ref >60)
GFR calc Af Amer: >60 mL/min (ref >60)
GFR calc non Af Amer: >60 mL/min (ref >60)
GFR calc non Af Amer: >60 mL/min (ref >60)
Glucose, Bld: 208 mg/dL — ABNORMAL HIGH (ref 65–99)
Glucose, Bld: 197 mg/dL — ABNORMAL HIGH (ref 65–99)
Potassium: 3.8 mmol/L (ref 3.5–5.1)
Potassium: 3.9 mmol/L (ref 3.5–5.1)
Sodium: 136 mmol/L (ref 135–145)
Sodium: 136 mmol/L (ref 135–145)
Total Bilirubin: 0.6 mg/dL (ref 0.3–1.2)
Total Bilirubin: 0.5 mg/dL (ref 0.3–1.2)
Total Protein: 7.1 g/dL (ref 6.5–8.1)
Total Protein: 7.3 g/dL (ref 6.5–8.1)

## 2016-03-30 LAB — PROTIME-INR
INR: 1.07 (ref 0.00–1.49)
Prothrombin Time: 14.1 seconds (ref 11.6–15.2)

## 2016-03-30 LAB — ETHANOL: Alcohol, Ethyl (B): <5 mg/dL (ref <5)

## 2016-03-30 LAB — DIFFERENTIAL
Basophils Absolute: 0 10*3/uL (ref 0.0–0.1)
Basophils Relative: 0 %
Eosinophils Absolute: 0.1 10*3/uL (ref 0.0–0.7)
Eosinophils Relative: 2 %
Lymphocytes Relative: 19 %
Lymphs Abs: 1.7 10*3/uL (ref 0.7–4.0)
Monocytes Absolute: 0.8 10*3/uL (ref 0.1–1.0)
Monocytes Relative: 8 %
Neutro Abs: 6.5 10*3/uL (ref 1.7–7.7)
Neutrophils Relative %: 71 %

## 2016-03-30 LAB — LIPID PANEL
Cholesterol: 175 mg/dL (ref 0–200)
HDL: 38 mg/dL — ABNORMAL LOW (ref >40)
LDL Cholesterol: 123 mg/dL — ABNORMAL HIGH (ref 0–99)
Total CHOL/HDL Ratio: 4.6 RATIO
Triglycerides: 70 mg/dL (ref <150)
VLDL: 14 mg/dL (ref 0–40)

## 2016-03-30 LAB — GLUCOSE, CAPILLARY
Glucose-Capillary: 152 mg/dL — ABNORMAL HIGH (ref 65–99)
Glucose-Capillary: 115 mg/dL — ABNORMAL HIGH (ref 65–99)

## 2016-03-30 LAB — APTT: aPTT: 26 seconds (ref 24–37)

## 2016-03-30 LAB — I-STAT TROPONIN, ED: Troponin i, poc: 0 ng/mL (ref 0.00–0.08)

## 2016-03-30 LAB — MRSA PCR SCREENING: MRSA by PCR: NEGATIVE

## 2016-03-30 MED ORDER — GABAPENTIN 100 MG PO CAPS: 100.0000 mg | ORAL_CAPSULE | Freq: Every day | ORAL | Status: DC

## 2016-03-30 MED ORDER — NICARDIPINE HCL IN NACL 20-0.86 MG/200ML-% IV SOLN
3.0000 mg/h | INTRAVENOUS | Status: DC
Start: 2016-03-30 — End: 2016-03-30

## 2016-03-30 MED ORDER — SIMVASTATIN 5 MG PO TABS: 5.0000 mg | ORAL_TABLET | Freq: Every day | ORAL | Status: DC

## 2016-03-30 MED ORDER — ONDANSETRON HCL 4 MG/2ML IJ SOLN
4.0000 mg | Freq: Once | INTRAMUSCULAR | Status: AC
  Administered 2016-03-30: 4 mg via INTRAVENOUS
  Filled 2016-03-30: qty 2

## 2016-03-30 MED ORDER — FAMOTIDINE IN NACL 20-0.9 MG/50ML-% IV SOLN
20.0000 mg | Freq: Two times a day (BID) | INTRAVENOUS | Status: DC
  Administered 2016-03-30 – 2016-03-31 (×3): 20 mg via INTRAVENOUS
  Filled 2016-03-30 (×4): qty 50

## 2016-03-30 MED ORDER — INSULIN GLARGINE 100 UNIT/ML ~~LOC~~ SOLN: 20.0000 [IU] | Freq: Every day | SUBCUTANEOUS | Status: DC

## 2016-03-30 MED ORDER — STROKE: EARLY STAGES OF RECOVERY BOOK
Freq: Once | Status: DC
  Filled 2016-03-30: qty 1

## 2016-03-30 MED ORDER — LABETALOL HCL 5 MG/ML IV SOLN: 10.0000 mg | INTRAVENOUS | Status: DC | PRN

## 2016-03-30 MED ORDER — LABETALOL HCL 5 MG/ML IV SOLN
INTRAVENOUS | Status: AC
  Filled 2016-03-30: qty 4

## 2016-03-30 MED ORDER — ACETAMINOPHEN 325 MG PO TABS: 650.0000 mg | ORAL_TABLET | ORAL | Status: DC | PRN

## 2016-03-30 MED ORDER — SODIUM CHLORIDE 0.9 % IV SOLN: INTRAVENOUS | Status: DC

## 2016-03-30 MED ORDER — SENNOSIDES-DOCUSATE SODIUM 8.6-50 MG PO TABS: 1.0000 | ORAL_TABLET | Freq: Every evening | ORAL | Status: DC | PRN

## 2016-03-30 MED ORDER — ACETAMINOPHEN 650 MG RE SUPP: 650.0000 mg | RECTAL | Status: DC | PRN

## 2016-03-30 MED ORDER — NICARDIPINE HCL IN NACL 20-0.86 MG/200ML-% IV SOLN
INTRAVENOUS | Status: AC
  Filled 2016-03-30: qty 200

## 2016-03-30 MED ORDER — INSULIN ASPART 100 UNIT/ML ~~LOC~~ SOLN: 0.0000 [IU] | Freq: Three times a day (TID) | SUBCUTANEOUS | Status: DC

## 2016-03-30 MED ORDER — ESCITALOPRAM OXALATE 10 MG PO TABS: 10.0000 mg | ORAL_TABLET | Freq: Every day | ORAL | Status: DC

## 2016-03-30 MED ORDER — AMLODIPINE BESYLATE 5 MG PO TABS: 10.0000 mg | ORAL_TABLET | Freq: Every day | ORAL | Status: DC

## 2016-03-30 MED ORDER — INSULIN ASPART 100 UNIT/ML ~~LOC~~ SOLN: 0.0000 [IU] | Freq: Every day | SUBCUTANEOUS | Status: DC

## 2016-03-30 MED ORDER — METOCLOPRAMIDE HCL 10 MG PO TABS: 10.0000 mg | ORAL_TABLET | Freq: Three times a day (TID) | ORAL | Status: DC | PRN

## 2016-03-30 MED ORDER — SENNOSIDES-DOCUSATE SODIUM 8.6-50 MG PO TABS
1.0000 | ORAL_TABLET | Freq: Two times a day (BID) | ORAL | Status: DC
  Administered 2016-03-30 – 2016-04-01 (×4): 1 via ORAL
  Filled 2016-03-30 (×4): qty 1

## 2016-03-30 MED ORDER — STROKE: EARLY STAGES OF RECOVERY BOOK
Freq: Once | Status: AC
  Administered 2016-03-30: 1
  Filled 2016-03-30: qty 1

## 2016-03-30 MED ORDER — LABETALOL HCL 5 MG/ML IV SOLN: 20.0000 mg | INTRAVENOUS | Status: AC | PRN

## 2016-03-30 NOTE — ED Provider Notes (Signed)
[facility] DEPT Provider Note   CSN: [id number] Arrival date & time: 03/30/16  1041  First Provider Contact:  First MD Initiated Contact with Patient 03/30/16 1050      An emergency department physician performed an initial assessment on this suspected stroke patient at 1041.  History   Chief Complaint Chief Complaint  Patient presents with  . Cerebrovascular Accident    HPI [name] is a 50 y.o. female.  50 year old female with past medical history diabetes who presents with mild confusion and left arm weakness. The patient has had approximately 1 week of progressively worsening nausea, vomiting. She has been seen in the ED for this diagnosis likely gastroparesis. She was prescribed antiemetics. She has had persistent vomiting. Earlier this morning, she began vomiting aggressively. There is no blood in her vomit. Shortly thereafter, she developed a mild headache as well as left arm weakness. She was brought to the ED. Code stroke called in route and patient taken immediately to the CT scanner.  Currently, the patient states her weakness is improving. Denies any headache. She has not blood thinners. She describes her nausea and abdominal pain is mild, epigastric pain. Made worse with eating.      Past Medical History:  Diagnosis Date  . Acute bronchitis 07/08/2011  . COMMON MIGRAINE 10/07/2010  . CVA (cerebral infarction) 04/19/2013  . Depression 12/18/2012  . Diabetes mellitus type II, uncontrolled ([place]) 10/07/2010   Qualifier: Diagnosis of  By: Charlett Blake MD, [name]    . Disturbance of skin sensation 10/07/2010  . DM 10/07/2010  . Essential hypertension, benign 10/07/2010  . Heart murmur   . HEART MURMUR, HX OF 10/07/2010  . Hyperlipidemia 12/06/2010  . Hypertension   . NEPHROLITHIASIS, HX OF 10/07/2010  . Overweight(278.02) 12/06/2010  . PERIPHERAL NEUROPATHY, FEET 10/07/2010  . PVD (peripheral vascular disease) ([place]) 01/21/2012  . RESTLESS LEG SYNDROME 10/25/2010  . SOB  (shortness of breath) 04/01/2012  . Stroke ([place])   . Weakness of right side of body 04/19/2013    Patient Active Problem List   Diagnosis Date Noted  . ICH (intracerebral hemorrhage) ([place]) 03/30/2016  . Nonhealing nonsurgical wound 03/06/2016  . Visit for preventive health examination 05/21/2015  . Breast cancer screening 05/21/2015  . Ceruminosis 01/08/2015  . Arm lesion 07/02/2014  . Pain in joint, ankle and foot 04/28/2014  . Pain in limb 01/13/2014  . Neuropathic pain of both legs 01/13/2014  . Hip pain 01/12/2014  . CVA (cerebral infarction) 04/19/2013  . Allergic rhinitis 03/10/2013  . Back pain 03/08/2013  . Depression 12/18/2012  . PVD (peripheral vascular disease) ([place]) 01/21/2012  . Diabetic ulcer of lower extremity ([place]) 12/22/2011  . Hyperlipidemia 12/06/2010  . Overweight(278.02) 12/06/2010  . RESTLESS LEG SYNDROME 10/25/2010  . Diabetes mellitus type II, uncontrolled ([place]) 10/07/2010  . COMMON MIGRAINE 10/07/2010  . Hereditary and idiopathic peripheral neuropathy 10/07/2010  . Essential hypertension, benign 10/07/2010  . DISTURBANCE OF SKIN SENSATION 10/07/2010  . HEART MURMUR, HX OF 10/07/2010  . NEPHROLITHIASIS, HX OF 10/07/2010    Past Surgical History:  Procedure Laterality Date  . None      OB History    No data available       Home Medications    Prior to Admission medications   Medication Sig Start Date End Date Taking? Authorizing Provider  glucose blood (ONE TOUCH ULTRA TEST) test strip Use once daily to check blood sugar.  DX E11.9 01/04/16  Yes Mosie Lukes, MD  HYDROcodone-acetaminophen (  NORCO) 5-325 MG tablet Take 1 tablet by mouth every 6 (six) hours as needed for moderate pain. 03/12/16  Yes Mosie Lukes, MD  Insulin Glargine (LANTUS SOLOSTAR) 100 UNIT/ML Solostar Pen Inject 50 Units into the skin 2 (two) times daily. 10/09/15  Yes Mosie Lukes, MD  Insulin Pen Needle 32G X 4 MM MISC Use as directed for insulin.DX E11.65 03/03/16  Yes  Mosie Lukes, MD  Lancets (FREESTYLE) lancets DX- 250.02  Pts machine is Freestyle Freedom Lite 04/20/13  Yes Mosie Lukes, MD  UNABLE TO FIND Iodine patch: Apply every Tuesday and Friday until wounds heal (remove old patch first)   Yes Historical Provider, MD  lisinopril (PRINIVIL,ZESTRIL) 10 MG tablet TAKE 1 TABLET (10 MG TOTAL) BY MOUTH 2 (TWO) TIMES DAILY. 03/04/16   Percell Miller Saguier, PA-C  metoCLOPramide (REGLAN) 10 MG tablet Take 1 tablet (10 mg total) by mouth every 8 (eight) hours as needed for nausea. 03/28/16   Malvin Johns, MD  ondansetron (ZOFRAN ODT) 4 MG disintegrating tablet 4mg  ODT q4 hours prn nausea/vomit 03/28/16   Malvin Johns, MD    Family History Family History  Problem Relation Age of Onset  . Arthritis Mother   . Stroke Brother     previous smoker  . Alcohol abuse Brother     in remission  . Leukemia Brother   . Diabetes Paternal Grandmother   . Healthy Son     Social History Social History  Substance Use Topics  . Smoking status: Former Smoker    Packs/day: 0.50    Quit date: 09/08/2005  . Smokeless tobacco: Never Used  . Alcohol use No     Allergies   Lyrica [pregabalin]; Tramadol; and Morphine and related   Review of Systems Review of Systems  Constitutional: Positive for fatigue. Negative for chills and fever.  HENT: Negative for congestion and rhinorrhea.   Eyes: Negative for visual disturbance.  Respiratory: Negative for cough and shortness of breath.   Cardiovascular: Negative for chest pain and leg swelling.  Gastrointestinal: Positive for nausea and vomiting. Negative for abdominal pain and diarrhea.  Genitourinary: Negative for dysuria and flank pain.  Musculoskeletal: Negative for neck pain.  Skin: Negative for rash.  Allergic/Immunologic: Negative for immunocompromised state.  Neurological: Positive for weakness and headaches. Negative for syncope.  Hematological: Does not bruise/bleed easily.     Physical Exam Updated Vital  Signs BP (!) 149/89 (BP Location: Right Arm)   Pulse 80   Temp 98.4 F (36.9 C) (Oral)   Resp 12   Ht 5\' 2"  (1.575 m)   Wt 143 lb 15.4 oz (65.3 kg)   LMP 02/12/2011   SpO2 100%   BMI 26.33 kg/m   Physical Exam  Constitutional: She appears well-developed and well-nourished. No distress.  HENT:  Head: Normocephalic.  Mouth/Throat: Oropharynx is clear and moist. No oropharyngeal exudate.  Eyes: Conjunctivae are normal. Pupils are equal, round, and reactive to light.  Neck: Normal range of motion. Neck supple.  Cardiovascular: Normal rate, regular rhythm and normal heart sounds.  Exam reveals no friction rub.   No murmur heard. Pulmonary/Chest: Effort normal and breath sounds normal. No respiratory distress. She has no wheezes. She has no rales.  Abdominal: Soft. She exhibits no distension. There is no tenderness. There is no rebound and no guarding.  Musculoskeletal: She exhibits no edema.  Neurological: She is alert. She exhibits normal muscle tone.  Skin: Skin is warm. Capillary refill takes less than 2 seconds. No  rash noted.  Nursing note and vitals reviewed.  Neurological Exam:  - Mental Status: Alert and oriented to person, place, and time. Attention and concentration normal. Speech clear. Recent memory is intact. - Cranial Nerves: Visual fields intact to confrontation in all quadrants bilaterally. EOMI and PERRLA. No nystagmus noted. Facial sensation intact at forehead, maxillary cheek, and chin/mandible bilaterally. No weakness of masticatory muscles. No facial asymmetry or weakness. Hearing grossly normal to finer rub. Uvula is midline, and palate elevates symmetrically. Normal SCM and trapezius strength. Tongue midline without fasciculations - Motor: Muscle strength 5/5 in proximal and distal UE and LE bilaterally. No pronator drift. Muscle tone normal. - Reflexes: 2+ and symmetrical in all four extremities.  - Sensation: Intact to light touch and pinprick in upper and lower  extremities distally bilaterally.  - Gait: Normal without ataxia. - Coordination: Mild dysmetria on left finger to nose.   ED Treatments / Results  Labs (all labs ordered are listed, but only abnormal results are displayed) Labs Reviewed  CBC - Abnormal; Notable for the following:       Result Value   Hemoglobin 11.6 (*)    HCT 35.3 (*)    Platelets 434 (*)    All other components within normal limits  COMPREHENSIVE METABOLIC PANEL - Abnormal; Notable for the following:    Glucose, Bld 208 (*)    Albumin 3.1 (*)    All other components within normal limits  COMPREHENSIVE METABOLIC PANEL - Abnormal; Notable for the following:    Glucose, Bld 197 (*)    Albumin 3.0 (*)    All other components within normal limits  LIPID PANEL - Abnormal; Notable for the following:    HDL 38 (*)    LDL Cholesterol 123 (*)    All other components within normal limits  GLUCOSE, CAPILLARY - Abnormal; Notable for the following:    Glucose-Capillary 152 (*)    All other components within normal limits  I-STAT CHEM 8, ED - Abnormal; Notable for the following:    Glucose, Bld 209 (*)    Calcium, Ion 1.10 (*)    Hemoglobin 11.9 (*)    HCT 35.0 (*)    All other components within normal limits  MRSA PCR SCREENING  ETHANOL  PROTIME-INR  APTT  DIFFERENTIAL  URINE RAPID DRUG SCREEN, HOSP PERFORMED  URINALYSIS, ROUTINE W REFLEX MICROSCOPIC (NOT AT [facility])  HEMOGLOBIN A1C  I-STAT TROPOININ, ED    EKG  EKG Interpretation None       Radiology Ct Head Code Stroke W/o Cm  Result Date: 03/30/2016 CLINICAL DATA:  Code stroke. Left upper extremity weakness. Nausea and vomiting. Hypertension EXAM: CT HEAD WITHOUT CONTRAST TECHNIQUE: Contiguous axial images were obtained from the base of the skull through the vertex without intravenous contrast. COMPARISON:  Head CT 04/19/2013.  MRI 04/23/2015. FINDINGS: Background pattern of chronic small vessel ischemic change affecting the cerebral hemispheric white  matter bilaterally. Acute hemorrhagic infarction right frontoparietal junction cortical and subcortical brain with the hemorrhage measuring 1.3 x 0.7 x 1.2 cm (volume = 0.568). No mass effect or shift. No other hemorrhage. No hydrocephalus. No extra-axial collection. Calvarium is unremarkable. Sinuses, middle ears and mastoids are clear. IMPRESSION: Acute hemorrhagic infarction at the right frontoparietal junction vertex measuring 1.3 x 0.7 x 1.2 cm. Volume 0.6 cc. No mass effect. Critical Value/emergent results were called by telephone at the time of interpretation on 03/30/2016 at 11:08 am to Dr. Orlena Sheldon , who verbally acknowledged these results. Electronically Signed  By: Nelson Chimes M.D.   On: 03/30/2016 11:10   Procedures Procedures (including critical care time)  Medications Ordered in ED Medications  acetaminophen (TYLENOL) tablet 650 mg (not administered)    Or  acetaminophen (TYLENOL) suppository 650 mg (not administered)  senna-docusate (Senokot-S) tablet 1 tablet (not administered)  famotidine (PEPCID) IVPB 20 mg premix (not administered)  labetalol (NORMODYNE,TRANDATE) injection 20 mg (not administered)  ondansetron (ZOFRAN) injection 4 mg (4 mg Intravenous Given 03/30/16 1236)   stroke: mapping our early stages of recovery book (1 each Does not apply Given 03/30/16 1300)     Initial Impression / Assessment and Plan / ED Course  I have reviewed the triage vital signs and the nursing notes.  Pertinent labs & imaging results that were available during my care of the patient were reviewed by me and considered in my medical decision making (see chart for details).  50 year old female with past medical history of diabetes who presents with headache and left arm weakness after vomiting. Code stroke called prior to arrival. On arrival, patient hypertensive but otherwise is hemodynamically stable. Neuro exam shows mild past pointing on left finger to nose, but is otherwise unremarkable.  Patient protecting her airway. Stat CT scan shows small intracranial hemorrhage. Blood pressure has self resolved without IV antihypertensives. Neurology recommends admission to stepdown for blood pressure monitoring and control. Otherwise, labs reviewed and are unremarkable with no acute emergency. Per neurology, there is possibility the patient's vomiting may contribute to increased intracranial pressure and subsequent hemorrhage. IV Zofran given here to prevent emesis. Patient states this improved her symptoms and is well-appearing.  Final Clinical Impressions(s) / ED Diagnoses   Final diagnoses:  Acute CVA (cerebrovascular accident) [facility])    New Prescriptions Current Discharge Medication List       Duffy Bruce, MD 03/30/16 2110

## 2016-03-30 NOTE — Consult Note (Signed)
Requesting Physician: ER    Chief Complaint: Code Stroke  History obtained from:  Patient   HPI:                                                                                                                                         [name] is an 50 y.o. female who had sudden onset of left arm ataxia this am.  No involvement of left face or leg.  She has not been on any antiplatelet or anticoagulation.  Her BP was as high as 230/140 per EMS.  CT brain shows right frontal ICH.  She was supposed to be on BP meds but not taking secondary to nausea and vomiting.  She has had a spider bite wound in the right lower leg which is rather large and bandaged.  Date last known well: 03/30/2016 Time last known well: 9:10 am tPA Given: NO since ICH  Intracerebral Hemorrhage (ICH) Score  Glascow Coma Score  0  Age 79  ICH volume 0  IVH 0  Infratentorial origin 0 Total:  0  Past Medical History:  Diagnosis Date  . Acute bronchitis 07/08/2011  . COMMON MIGRAINE 10/07/2010  . CVA (cerebral infarction) 04/19/2013  . Depression 12/18/2012  . Diabetes mellitus type II, uncontrolled ([place]) 10/07/2010   Qualifier: Diagnosis of  By: Charlett Blake MD, [name]    . Disturbance of skin sensation 10/07/2010  . DM 10/07/2010  . Essential hypertension, benign 10/07/2010  . Heart murmur   . HEART MURMUR, HX OF 10/07/2010  . Hyperlipidemia 12/06/2010  . Hypertension   . NEPHROLITHIASIS, HX OF 10/07/2010  . Overweight(278.02) 12/06/2010  . PERIPHERAL NEUROPATHY, FEET 10/07/2010  . PVD (peripheral vascular disease) ([place]) 01/21/2012  . RESTLESS LEG SYNDROME 10/25/2010  . SOB (shortness of breath) 04/01/2012  . Stroke ([place])   . Weakness of right side of body 04/19/2013    Past Surgical History:  Procedure Laterality Date  . None      Family History  Problem Relation Age of Onset  . Arthritis Mother   . Stroke Brother     previous smoker  . Alcohol abuse Brother     in remission  . Leukemia Brother   .  Diabetes Paternal Grandmother   . Healthy Son    Social History:  reports that she quit smoking about 10 years ago. She smoked 0.50 packs per day. She has never used smokeless tobacco. She reports that she does not drink alcohol or use drugs.  Allergies:  Allergies  Allergen Reactions  . Tramadol Other (See Comments)    Pt cant tolerate this pain med.   . Morphine And Related Nausea And Vomiting    Medications:  1. Norco prn 2. Lantus 50 units bid   ROS:                                                                                                                                       History obtained from patient  General ROS: negative for - chills, fatigue, fever, night sweats, weight gain or weight loss Psychological ROS: negative for - behavioral disorder, hallucinations, memory difficulties, mood swings or suicidal ideation Ophthalmic ROS: negative for - blurry vision, double vision, eye pain or loss of vision ENT ROS: negative for - epistaxis, nasal discharge, oral lesions, sore throat, tinnitus or vertigo Allergy and Immunology ROS: negative for - hives or itchy/watery eyes Hematological and Lymphatic ROS: negative for - bleeding problems, bruising or swollen lymph nodes Endocrine ROS: negative for - galactorrhea, hair pattern changes, polydipsia/polyuria or temperature intolerance Respiratory ROS: negative for - cough, hemoptysis, shortness of breath or wheezing Cardiovascular ROS: negative for - chest pain, dyspnea on exertion, edema or irregular heartbeat Gastrointestinal ROS: negative for - abdominal pain, diarrhea, hematemesis, nausea/vomiting or stool incontinence Genito-Urinary ROS: negative for - dysuria, hematuria, incontinence or urinary frequency/urgency Musculoskeletal ROS: negative for - joint swelling or muscular weakness Neurological ROS: as  noted in HPI Dermatological ROS: negative for rash and skin lesion changes  Neurologic Examination:                                                                                                      Last menstrual period 02/12/2011.  HEENT-  Normocephalic, no lesions, without obvious abnormality.  Normal external eye and conjunctiva.  Normal TM's bilaterally.  Normal auditory canals and external ears. Normal external nose, mucus membranes and septum.  Normal pharynx. Cardiovascular- regular rate and rhythm, S1, S2 normal, no murmur, click, rub or gallop, pulses palpable throughout   Lungs- chest clear, no wheezing, rales, normal symmetric air entry, Heart exam - S1, S2 normal, no murmur, no gallop, rate regular Abdomen- soft, non-tender; bowel sounds normal; no masses,  no organomegaly Extremities- no edema Lymph-no adenopathy palpable Musculoskeletal-no joint tenderness, deformity or swelling Skin-warm and dry, no hyperpigmentation, vitiligo, or suspicious lesions  Neurological Examination Mental Status: Alert, oriented, thought content appropriate.  Speech fluent without evidence of aphasia.  Able to follow 3 step commands without difficulty. Cranial Nerves: II: Discs flat bilaterally; Visual fields grossly normal, pupils equal, round, reactive to light and accommodation III,IV, VI: ptosis not present, extra-ocular motions intact bilaterally V,VII: smile symmetric, facial light touch  sensation normal bilaterally VIII: hearing normal bilaterally IX,X: uvula rises symmetrically XI: bilateral shoulder shrug XII: midline tongue extension Motor: Right : Upper extremity   5/5    Left:     Upper extremity   5/5  Lower extremity   5/5     Lower extremity   5/5 Tone and bulk:normal tone throughout; no atrophy noted Sensory: Pinprick and light touch intact throughout, bilaterally Deep Tendon Reflexes: 2+ and symmetric throughout Plantars: Right: downgoing   Left:  downgoing Cerebellar: normal finger-to-nose, normal rapid alternating movements and normal heel-to-shin test, except dysmetria on left finger to nose and possible left heel to shin. Gait: normal gait and station       Lab Results: Basic Metabolic Panel:  Recent Labs Lab 03/28/16 1818 03/30/16 1051  NA 134* 139  K 4.1 3.8  CL 98* 101  CO2 28  --   GLUCOSE 309* 209*  BUN 21* 17  CREATININE 0.67 0.50  CALCIUM 9.0  --     Liver Function Tests:  Recent Labs Lab 03/28/16 1818  AST 15  ALT 16  ALKPHOS 98  BILITOT 0.5  PROT 7.8  ALBUMIN 3.3*    Recent Labs Lab 03/28/16 1818  LIPASE 25   No results for input(s): AMMONIA in the last 168 hours.  CBC:  Recent Labs Lab 03/28/16 1818 03/30/16 1051 03/30/16 1055  WBC 8.2  --  9.2  NEUTROABS 5.4  --  6.5  HGB 12.5 11.9* 11.6*  HCT 37.3 35.0* 35.3*  MCV 83.6  --  82.9  PLT 505*  --  434*    Cardiac Enzymes: No results for input(s): CKTOTAL, CKMB, CKMBINDEX, TROPONINI in the last 168 hours.  Lipid Panel: No results for input(s): CHOL, TRIG, HDL, CHOLHDL, VLDL, LDLCALC in the last 168 hours.  CBG: No results for input(s): GLUCAP in the last 168 hours.  Microbiology: Results for orders placed or performed in visit on 03/03/16  Blood culture (routine single)     Status: None   Collection Time: 03/03/16  3:27 PM  Result Value Ref Range Status   Organism ID, Bacteria NO GROWTH 5 DAYS  Final    Comment: Culture results may be compromised due to an excessive volume of blood received in culture bottles.     Coagulation Studies: No results for input(s): LABPROT, INR in the last 72 hours.  Imaging: US Abdomen Complete  Result Date: 03/28/2016 CLINICAL DATA:  Patient with nausea, vomiting and diarrhea. EXAM: ABDOMEN ULTRASOUND COMPLETE COMPARISON:  None. FINDINGS: Gallbladder: No gallstones or wall thickening visualized. No sonographic Murphy sign noted by sonographer. Common bile duct: Diameter: 3 mm Liver:  No focal lesion identified. Within normal limits in parenchymal echogenicity. IVC: No abnormality visualized. Pancreas: Visualized portion unremarkable. Spleen: Size and appearance within normal limits. Right Kidney: Length: 11.7 cm. Echogenicity within normal limits. No mass or hydronephrosis visualized. Left Kidney: Length: 11.7 cm. Echogenicity within normal limits. No mass or hydronephrosis visualized. Abdominal aorta: No aneurysm visualized. Other findings: None. IMPRESSION: Unremarkable abdominal ultrasound. No cholelithiasis or sonographic evidence for acute cholecystitis. No hydronephrosis. Electronically Signed   By: Lovey Newcomer M.D.   On: 03/28/2016 19:58      Assessment: 50 y.o. female   Right frontal intraparenchymal hemorrhage without significant mass effect or midline shift. This hypertensive related.    Stroke Risk Factors - diabetes mellitus, hyperlipidemia and hypertension   Recommendations. 1. HgbA1c, fasting lipid panel 2. IV labetalol 10 mg q2hr prn to maintain SBP <160 and  DBP <90; May consider Cardene gtt 5-15 mg/hr if needed to control BP; Start Norvasc 10 mg qd for long term maintenance. 3. PT consult, OT consult, Speech consult 4. No antiplatelets 7. Risk factor modification 8. Telemetry monitoring 9. Frequent neuro checks 10 NPO until passes stroke swallow screen 11 please page stroke NP  Or  PA  Or MD from 8am -4 pm  as this patient from this time will be  followed by the stroke.   You can look them up on [email or web address]  Password [email or web address] 12. Repeat CT Brain in 24 hours to monitor hemorrhage. 13. Stat CT Brain if any acute changes in mental status.      Rogue Jury MD [phone]  03/30/2016, 11:09 AM

## 2016-03-30 NOTE — ED Notes (Signed)
Rapid response Neurologist no medication for BP to be given at this time.

## 2016-03-30 NOTE — ED Triage Notes (Signed)
Arrived by EMS Onset today LKW 0910 today developed left arm weakness and trouble getting words out. EMS reported left arm weakness. Code stroke called. Patient nausea and emesis multiple episodes today no diarrhea. EMS reported initial BP 230/138 last BP 166/112. CBG 203 History of spider bite 2-3 months ago right lwoer extremity.

## 2016-03-31 ENCOUNTER — Inpatient Hospital Stay (HOSPITAL_COMMUNITY): Payer: BLUE CROSS/BLUE SHIELD

## 2016-03-31 ENCOUNTER — Telehealth: Payer: Self-pay

## 2016-03-31 LAB — GLUCOSE, CAPILLARY
Glucose-Capillary: 186 mg/dL — ABNORMAL HIGH (ref 65–99)
Glucose-Capillary: 221 mg/dL — ABNORMAL HIGH (ref 65–99)
Glucose-Capillary: 229 mg/dL — ABNORMAL HIGH (ref 65–99)
Glucose-Capillary: 327 mg/dL — ABNORMAL HIGH (ref 65–99)

## 2016-03-31 LAB — HEMOGLOBIN A1C
Hgb A1c MFr Bld: 12.6 % — ABNORMAL HIGH (ref 4.8–5.6)
Mean Plasma Glucose: 315 mg/dL

## 2016-03-31 MED ORDER — GADOBENATE DIMEGLUMINE 529 MG/ML IV SOLN: 15.0000 mL | Freq: Once | INTRAVENOUS | Status: DC | PRN

## 2016-03-31 MED ORDER — ONDANSETRON HCL 4 MG/2ML IJ SOLN
4.0000 mg | Freq: Three times a day (TID) | INTRAMUSCULAR | Status: DC | PRN
  Administered 2016-03-31 – 2016-04-01 (×2): 4 mg via INTRAVENOUS
  Filled 2016-03-31 (×2): qty 2

## 2016-03-31 MED ORDER — INSULIN GLARGINE 100 UNIT/ML ~~LOC~~ SOLN
50.0000 [IU] | Freq: Two times a day (BID) | SUBCUTANEOUS | Status: DC
  Administered 2016-03-31 – 2016-04-01 (×2): 50 [IU] via SUBCUTANEOUS
  Filled 2016-03-31 (×5): qty 0.5

## 2016-03-31 MED ORDER — GADOBENATE DIMEGLUMINE 529 MG/ML IV SOLN: 14.0000 mL | Freq: Once | INTRAVENOUS | Status: DC | PRN

## 2016-03-31 NOTE — Progress Notes (Signed)
Patient to transfer to 5C10 report given to receiving nurse, all questions answered at this time.  Pt. VSS with no s/s of distress noted.  Patient stable at transfer.

## 2016-03-31 NOTE — Progress Notes (Signed)
Occupational Therapy Treatment Patient Details Name: [name] MRN: [id number] DOB: 04-Sep-1966 Today's Date: 03/31/2016    History of present illness 50 y.o.femalewho had sudden onset of left arm ataxia, and hx of nausea and vomitting. CT brain shows right frontal ICH. Additionally, she has had a spider bite wound in the right lower leg which is rather large and bandaged. Past medical history of diabetes and prior CVA.   OT comments  Pt was provided with initial HEP and was able to return  Demonstration with supervision.  Will continue to follow acutely.  Recommend OPOT at discharge.   Follow Up Recommendations  Outpatient OT    Equipment Recommendations  None recommended by OT    Recommendations for Other Services      Precautions / Restrictions Precautions Precautions: None       Mobility Bed Mobility                  Transfers                      Balance                                   ADL                                         General ADL Comments: Discussed fatigue with pt and that fatigue may be present and persist after CVA      Vision                     Perception     Praxis      Cognition   Behavior During Therapy: [facility] for tasks assessed/performed Overall Cognitive Status: Within Functional Limits for tasks assessed                       Extremity/Trunk Assessment               Exercises Other Exercises Other Exercises: Pt provided with level one theraband and instructed in HEP to increase Lt UE strength, but also improve proprioceptive input and reduce dysmetria.  She performed 10 reps each of the following:  Shoulder horizontal abduction, shoulder flexion, elbow flexion, and elbow extension.     Shoulder Instructions       General Comments      Pertinent Vitals/ Pain       Pain Assessment: Faces Faces Pain Scale: No hurt  Home Living                                           Prior Functioning/Environment              Frequency       Progress Toward Goals  OT Goals(current goals can now be found in the care plan section)  Progress towards OT goals: Progressing toward goals  ADL Goals Additional ADL Goal #1: Pt will be independent with HEP for Lt UE   Plan Discharge plan remains appropriate    Co-evaluation                 End of Session     Activity Tolerance  Patient tolerated treatment well   Patient Left in bed;with call bell/phone within reach;with family/visitor present   Nurse Communication Mobility status        Time: [id number] OT Time Calculation (min): 18 min  Charges: OT General Charges $OT Visit: 1 Procedure OT Treatments $Therapeutic Exercise: 8-22 mins  Conarpe, Wendi M 03/31/2016, 6:33 PM

## 2016-03-31 NOTE — Progress Notes (Addendum)
STROKE TEAM PROGRESS NOTE   HISTORY OF PRESENT ILLNESS (per record) [name] is an 50 y.o. female who had sudden onset of left arm ataxia this am. (LKW 03/30/2016 0910a). No involvement of left face or leg. She has not been on any antiplatelet or anticoagulation. Her BP was as high as 230/140 per EMS. CT brain shows right frontal ICH.  She was supposed to be on BP meds but not taking secondary to nausea and vomiting.  She has had a spider bite wound in the right lower leg which is rather large and bandaged. Patient was not administered IV t-PA secondary to ich. She was admitted to the step down ICU for further evaluation and treatment.   SUBJECTIVE (INTERVAL HISTORY) Her family is at the bedside.  Overall she feels her condition is stable.    OBJECTIVE Temp:  [98.2 F (36.8 C)-98.7 F (37.1 C)] 98.7 F (37.1 C) (07/24 0742) Pulse Rate:  [80-100] 97 (07/24 0742) Cardiac Rhythm: Normal sinus rhythm (07/23 2130) Resp:  [0-25] 25 (07/24 0742) BP: (140-181)/(84-128) 140/87 (07/24 0742) SpO2:  [99 %-100 %] 100 % (07/24 0742) Weight:  [65.3 kg (143 lb 15.4 oz)] 65.3 kg (143 lb 15.4 oz) (07/23 1058)  CBC:  Recent Labs Lab 03/28/16 1818 03/30/16 1051 03/30/16 1055  WBC 8.2  --  9.2  NEUTROABS 5.4  --  6.5  HGB 12.5 11.9* 11.6*  HCT 37.3 35.0* 35.3*  MCV 83.6  --  82.9  PLT 505*  --  434*    Basic Metabolic Panel:  Recent Labs Lab 03/30/16 1055 03/30/16 1318  NA 136 136  K 3.8 3.9  CL 104 103  CO2 24 27  GLUCOSE 208* 197*  BUN 15 13  CREATININE 0.59 0.52  CALCIUM 9.1 8.9    Lipid Panel:    Component Value Date/Time   CHOL 175 03/30/2016 1318   TRIG 70 03/30/2016 1318   HDL 38 (L) 03/30/2016 1318   CHOLHDL 4.6 03/30/2016 1318   VLDL 14 03/30/2016 1318   LDLCALC 123 (H) 03/30/2016 1318   HgbA1c:  Lab Results  Component Value Date   HGBA1C 12.6 (H) 03/30/2016   Urine Drug Screen: No results found for: LABOPIA, COCAINSCRNUR, LABBENZ, AMPHETMU, THCU, LABBARB     IMAGING  Ct Head Code Stroke W/o Cm  Result Date: 03/30/2016 CLINICAL DATA:  Code stroke. Left upper extremity weakness. Nausea and vomiting. Hypertension EXAM: CT HEAD WITHOUT CONTRAST TECHNIQUE: Contiguous axial images were obtained from the base of the skull through the vertex without intravenous contrast. COMPARISON:  Head CT 04/19/2013.  MRI 04/23/2015. FINDINGS: Background pattern of chronic small vessel ischemic change affecting the cerebral hemispheric white matter bilaterally. Acute hemorrhagic infarction right frontoparietal junction cortical and subcortical brain with the hemorrhage measuring 1.3 x 0.7 x 1.2 cm (volume = 0.568). No mass effect or shift. No other hemorrhage. No hydrocephalus. No extra-axial collection. Calvarium is unremarkable. Sinuses, middle ears and mastoids are clear. IMPRESSION: Acute hemorrhagic infarction at the right frontoparietal junction vertex measuring 1.3 x 0.7 x 1.2 cm. Volume 0.6 cc. No mass effect. Critical Value/emergent results were called by telephone at the time of interpretation on 03/30/2016 at 11:08 am to Dr. Orlena Sheldon , who verbally acknowledged these results. Electronically Signed   By: Nelson Chimes M.D.   On: 03/30/2016 11:10      PHYSICAL EXAM Pleasant middle-age Caucasian lady currently not in distress. . Afebrile. Head is nontraumatic. Neck is supple without bruit.    Cardiac  exam no murmur or gallop. Lungs are clear to auscultation. Distal pulses are well felt. Neurological Exam ;  Awake  Alert oriented x 3. Normal speech and language.eye movements full without nystagmus.fundi were not visualized. Vision acuity and fields appear normal. Hearing is normal. Palatal movements are normal. Face symmetric. Tongue midline. Normal strength, tone, reflexes and coordination. Normal sensation. Gait deferred.  ASSESSMENT/PLAN [name] is a 50 y.o. female with history of HTN presenting with  left arm ataxia. CT showed a small right  frontoparietal ICH. She did not receive IV t-PA due to [place].   Stroke:  Non-dominant right frontoparietal ICH secondary to unclear source. Odd location for hypertensive hemorrhage. Will do additional workup.  MRI with and without contrast ordered  MRA  ordered  Carotid Doppler  ordered  2D Echo  ordered  LDL 123  HgbA1c 12.6  SCDs for VTE prophylaxis  Diet heart healthy/carb modified Room service appropriate? Yes; Fluid consistency: Thin  No antithrombotic prior to admission, now on No antithrombotic given acute hemorrhage.   Ongoing aggressive stroke risk factor management  Therapy recommendations:  OP OT, no PT  Disposition:  Return home  Okay to transfer to floor from stroke standpoint  Hypertensive Emergency   blood pressure 230/140 on arrival in setting of neurologic symptoms  Stable  Long-term BP goal normotensive  Diabetes type II  HgbA1c 12.6, goal < 7.0  Uncontrolled  Other Stroke Risk Factors  Former Cigarette smoker  Hx stroke/TIA  3 years ago per pt - affected R arm, slurred speech, recovered within 1 week  Family hx stroke (brother)  Migraines  Other Active Problems  Nausea and vomiting, on zofran. Pt feels N&V secondary to iodine patches recently added to leg wound regimen  LE wound, going to wound center. Recent addition of iodine patch, pt feels added to N&V  Hospital day # [place] for Pager information 03/31/2016 3:54 PM  I have personally examined this patient, reviewed notes, independently viewed imaging studies, participated in medical decision making and plan of care. I have made any additions or clarifications directly to the above note. Agree with note above.  Patient presented with severe nausea vomiting with significantly elevated blood pressure. She had mild slurred speech and ataxia which seems to have resolved. Blood pressure much better control. She remains at risk for neurological  worsening, recurrent stroke management of expansion. Recommend strict control of blood pressure and close neurological monitoring. Greater than 50% time during this 30 minute visit was spent on counseling and coordination of care about intracerebral hemorrhage, risk, prevention and treatment  Antony Contras, MD Medical Director [place] Pager: [phone] 04/01/2016 1:52 PM    To contact Stroke Continuity provider, please refer to [email or web address] After hours, contact General Neurology

## 2016-03-31 NOTE — Progress Notes (Signed)
Nutrition Brief Note  Patient identified on the Malnutrition Screening Tool (MST) Report.   Wt Readings from Last 15 Encounters:  03/30/16 143 lb 15.4 oz (65.3 kg)  03/28/16 146 lb (66.2 kg)  03/28/16 141 lb 12.8 oz (64.3 kg)  03/06/16 147 lb (66.7 kg)  03/03/16 147 lb (66.7 kg)  02/21/16 147 lb 3.2 oz (66.8 kg)  02/12/16 148 lb (67.1 kg)  02/07/16 149 lb 6.4 oz (67.8 kg)  11/20/15 151 lb 4 oz (68.6 kg)  10/09/15 147 lb 2 oz (66.7 kg)  05/21/15 145 lb 2 oz (65.8 kg)  04/06/15 142 lb (64.4 kg)  03/21/15 143 lb 2 oz (64.9 kg)  03/02/15 142 lb 1 oz (64.4 kg)  01/08/15 144 lb (65.3 kg)    Body mass index is 26.33 kg/m. Patient meets criteria for overweight based on current BMI.   Patient with some weight loss over the past few days due to dehydration from vomiting and acute illness. Nutrition focused physical exam completed.  No muscle or subcutaneous fat depletion noticed. Current diet order is Heart Healthy, patient is consuming approximately 75-100% of meals at this time. Labs and medications reviewed.   No nutrition interventions warranted at this time. If nutrition issues arise, please consult RD.   Molli Barrows, RD, LDN, [place] Pager [phone] After Hours Pager [phone]

## 2016-03-31 NOTE — Telephone Encounter (Signed)
Received FMLA paperwork.  Forms completed by PCP.  Forms faxed 3 times.  No fax confirmation received.  Made a copy.  Copies numbered and sent for scanning.  The original mailed to [place].

## 2016-03-31 NOTE — Evaluation (Signed)
Occupational Therapy Evaluation Patient Details Name: [name] MRN: [id number] DOB: [birth year] Today's Date: 03/31/2016    History of Present Illness 50 y.o.femalewho had sudden onset of left arm ataxia, and hx of nausea and vomitting. CT brain shows right frontal ICH. Additionally, she has had a spider bite wound in the right lower leg which is rather large and bandaged. Past medical history of diabetes and prior CVA.   Clinical Impression   Pt admitted with above. She demonstrates the below listed deficits and will benefit from continued OT to maximize safety and independence with BADLs.  Pt presents to OT with mild dysmetria Lt UE which impaired functional use of Lt UE. SHe is eager to improve in order to return to work.  Pt instructed in initial HEP.  Will follow acutely.  Recommend OPOT.       Follow Up Recommendations  Outpatient OT    Equipment Recommendations  None recommended by OT    Recommendations for Other Services       Precautions / Restrictions Precautions Precautions: None      Mobility Bed Mobility Overal bed mobility: Independent                Transfers Overall transfer level: Independent                    Balance                                            ADL Overall ADL's : Modified independent                                             Vision Vision Assessment?: No apparent visual deficits   Perception Perception Perception Tested?: Yes   Praxis Praxis Praxis tested?: Within functional limits    Pertinent Vitals/Pain Pain Assessment: Faces Faces Pain Scale: No hurt     Hand Dominance Right   Extremity/Trunk Assessment Upper Extremity Assessment Upper Extremity Assessment: LUE deficits/detail LUE Deficits / Details: Dysmetria noted.  Mild weakness noted with pronator drift and mild flexor synergy patterns  LUE Sensation: decreased proprioception LUE Coordination:  decreased gross motor   Lower Extremity Assessment Lower Extremity Assessment: Defer to PT evaluation   Cervical / Trunk Assessment Cervical / Trunk Assessment: Normal   Communication Communication Communication: No difficulties   Cognition Arousal/Alertness: Awake/alert Behavior During Therapy: WFL for tasks assessed/performed Overall Cognitive Status: Within Functional Limits for tasks assessed                     General Comments       Exercises Exercises: Other exercises Other Exercises Other Exercises: worked on facilitation of reach with emphasis on targeting objects with Lt UE.  Instructed pt and spouse to continue this type of activity, but switch up the types of objects she is reaching for.   Pt also instructed in wheelchair push ups    Shoulder Instructions      Home Living Family/patient expects to be discharged to:: Private residence Living Arrangements: Spouse/significant other Available Help at Discharge: Family Type of Home: House Home Access: Level entry     Home Layout: One level     Bathroom Shower/Tub: Teacher, early years/pre: Standard  Home Equipment: Cane - single point          Prior Functioning/Environment Level of Independence: Independent        Comments: Pt works full time as a Science writer at [employer]     OT Diagnosis: Generalized weakness;Hemiplegia non-dominant side;Ataxia   OT Problem List: Decreased strength;Decreased coordination;Impaired sensation;Impaired UE functional use   OT Treatment/Interventions: Self-care/ADL training;Neuromuscular education;Therapeutic activities;Patient/family education    OT Goals(Current goals can be found in the care plan section) Acute Rehab OT Goals Patient Stated Goal: "Ive got to get this Lt arm working.  I have to get back to work  OT Goal Formulation: With patient/family Time For Goal Achievement: 04/07/16 Potential to Achieve Goals: Good ADL Goals Additional ADL Goal  #1: Pt will be independent with HEP for Lt UE   OT Frequency: Min 2X/week   Barriers to D/C:            Co-evaluation              End of Session    Activity Tolerance: Patient tolerated treatment well Patient left: in bed;with call bell/phone within reach;with family/visitor present   Time: [id number] OT Time Calculation (min): 26 min Charges:  OT General Charges $OT Visit: 1 Procedure OT Evaluation $OT Eval Low Complexity: 1 Procedure OT Treatments $Neuromuscular Re-education: 8-22 mins G-Codes:    Conarpe, Wendi M 2016/04/22, 2:35 PM

## 2016-03-31 NOTE — Evaluation (Signed)
Physical Therapy Evaluation Patient Details Name: [name] MRN: [id number] DOB: 03-22-1966 Today's Date: 03/31/2016   History of Present Illness  50 y.o.femalewho had sudden onset of left arm ataxia, and hx of nausea and vomitting. CT brain shows right frontal ICH. Additionally, she has had a spider bite wound in the right lower leg which is rather large and bandaged. Past medical history of diabetes and prior CVA.  Clinical Impression  Patient seen for mobility assessment s/p ICH. Patient mobilizing well, modest antalgic gait due to spider bite, otherwise no deviations noted. At this time, do not feel patient requires further acute PT. Will defer UE assessment to OT therapies. Patient in agreement, will sign off.    Follow Up Recommendations No PT follow up (defer to OT needs)    Equipment Recommendations  None recommended by PT    Recommendations for Other Services       Precautions / Restrictions        Mobility  Bed Mobility               General bed mobility comments: received in chair  Transfers Overall transfer level: Independent Equipment used: None                Ambulation/Gait Ambulation/Gait assistance: Independent Ambulation Distance (Feet): 310 Feet Assistive device: None Gait Pattern/deviations: Antalgic Gait velocity: WFL Gait velocity interpretation: at or above normal speed for age/gender General Gait Details: steady with ambulation, no physical assist or LOB  Stairs            Wheelchair Mobility    Modified Rankin (Stroke Patients Only) Modified Rankin (Stroke Patients Only) Pre-Morbid Rankin Score: No symptoms Modified Rankin: Moderate disability     Balance Overall balance assessment: Independent                                           Pertinent Vitals/Pain Pain Assessment: Faces Faces Pain Scale: Hurts little more Pain Location: RLE with weight bearing and touch Pain Descriptors /  Indicators: Sore Pain Intervention(s): Monitored during session    Home Living Family/patient expects to be discharged to:: Private residence Living Arrangements: Spouse/significant other Available Help at Discharge: Family Type of Home: House Home Access: Level entry     Home Layout: One level Home Equipment: Kasandra Knudsen - single point      Prior Function Level of Independence: Independent               Hand Dominance   Dominant Hand: Right    Extremity/Trunk Assessment   Upper Extremity Assessment: Defer to OT evaluation           Lower Extremity Assessment: RLE deficits/detail         Communication   Communication: No difficulties  Cognition Arousal/Alertness: Awake/alert Behavior During Therapy: WFL for tasks assessed/performed Overall Cognitive Status: Within Functional Limits for tasks assessed                      General Comments      Exercises        Assessment/Plan    PT Assessment Patent does not need any further PT services  PT Diagnosis Acute pain   PT Problem List    PT Treatment Interventions     PT Goals (Current goals can be found in the Care Plan section) Acute Rehab PT Goals PT Goal  Formulation: All assessment and education complete, DC therapy    Frequency     Barriers to discharge        Co-evaluation               End of Session Equipment Utilized During Treatment: Gait belt Activity Tolerance: Patient tolerated treatment well Patient left: Other (comment) (with transport) Nurse Communication: Mobility status         Time: [id number] PT Time Calculation (min) (ACUTE ONLY): 15 min   Charges:   PT Evaluation $PT Eval Moderate Complexity: 1 Procedure     PT G CodesDuncan Dull 2016-04-22, 9:18 AM Alben Deeds, PT DPT  [phone]

## 2016-03-31 NOTE — Progress Notes (Signed)
Pt admitted to [place]. Alert and oriented.  Denies any pain or discomfort.  Tele placed on patient and CCMD called.  Bed alarm on and call bell within reach.  Pt verbalizes understanding to call before getting out of bed.  Family at bedside.

## 2016-04-01 ENCOUNTER — Other Ambulatory Visit: Payer: Self-pay | Admitting: Family Medicine

## 2016-04-01 ENCOUNTER — Inpatient Hospital Stay (HOSPITAL_COMMUNITY): Payer: BLUE CROSS/BLUE SHIELD

## 2016-04-01 DIAGNOSIS — R112 Nausea with vomiting, unspecified: Secondary | ICD-10-CM | POA: Diagnosis present

## 2016-04-01 DIAGNOSIS — I6789 Other cerebrovascular disease: Secondary | ICD-10-CM | POA: Diagnosis not present

## 2016-04-01 DIAGNOSIS — I610 Nontraumatic intracerebral hemorrhage in hemisphere, subcortical: Secondary | ICD-10-CM

## 2016-04-01 DIAGNOSIS — I639 Cerebral infarction, unspecified: Secondary | ICD-10-CM

## 2016-04-01 LAB — GLUCOSE, CAPILLARY
Glucose-Capillary: 266 mg/dL — ABNORMAL HIGH (ref 65–99)
Glucose-Capillary: 240 mg/dL — ABNORMAL HIGH (ref 65–99)

## 2016-04-01 LAB — ECHOCARDIOGRAM COMPLETE
Height: 62 in
Weight: 2336 oz

## 2016-04-01 LAB — VAS US CAROTID
LEFT ECA DIAS: -15 cm/s
LEFT VERTEBRAL DIAS: -17 cm/s
Left CCA dist dias: -28 cm/s
Left CCA dist sys: -85 cm/s
Left CCA prox dias: 26 cm/s
Left CCA prox sys: 87 cm/s
Left ICA dist dias: -35 cm/s
Left ICA dist sys: -95 cm/s
Left ICA prox dias: -31 cm/s
Left ICA prox sys: -80 cm/s
RIGHT ECA DIAS: -12 cm/s
RIGHT VERTEBRAL DIAS: 16 cm/s
Right CCA prox dias: 15 cm/s
Right CCA prox sys: 67 cm/s
Right cca dist sys: -80 cm/s

## 2016-04-01 MED ORDER — FAMOTIDINE IN NACL 20-0.9 MG/50ML-% IV SOLN: 20.0000 mg | Freq: Two times a day (BID) | INTRAVENOUS | Status: DC

## 2016-04-01 MED ORDER — LISINOPRIL 10 MG PO TABS
10.0000 mg | ORAL_TABLET | Freq: Every day | ORAL | Status: DC
  Administered 2016-04-01: 10 mg via ORAL
  Filled 2016-04-01: qty 1

## 2016-04-01 MED ORDER — FAMOTIDINE 20 MG PO TABS
20.0000 mg | ORAL_TABLET | Freq: Two times a day (BID) | ORAL | Status: DC
  Administered 2016-04-01: 20 mg via ORAL
  Filled 2016-04-01: qty 1

## 2016-04-01 NOTE — Progress Notes (Signed)
Occupational Therapy Treatment and Discharge.  Patient Details Name: [name] MRN: [id number] DOB: 09-18-1965 Today's Date: 04/01/2016    History of present illness 50 y.o.femalewho had sudden onset of left arm ataxia, and hx of nausea and vomitting. CT brain shows right frontal ICH. Additionally, she has had a spider bite wound in the right lower leg which is rather large and bandaged. Past medical history of diabetes and prior CVA.   OT comments  Pt independent with current HEP and provided with updated HEP for [facility].  She demonstrates independence.  Pt ready for discharge.  Goals met, pt and spouse agree.    Follow Up Recommendations  Outpatient OT    Equipment Recommendations  None recommended by OT    Recommendations for Other Services      Precautions / Restrictions         Mobility Bed Mobility                  Transfers                      Balance                                   ADL                                         General ADL Comments: Pt is mod I       Vision                     Perception     Praxis      Cognition   Behavior During Therapy: WFL for tasks assessed/performed Overall Cognitive Status: Within Functional Limits for tasks assessed                       Extremity/Trunk Assessment               Exercises Other Exercises Other Exercises: Pt reports she has been doing her theraband exercises regularly, no questions.  She demonstrates mild dymetria Lt UE worse with abduction.  Pt provided with HEP for [facility] - turning ball clockwise and counterclockwise to work on thumb movements superimposed on fingers and vise versa, as well tossing ball .  Pt demonstrates independence with this.  She is ready to discharge    Shoulder Instructions       General Comments      Pertinent Vitals/ Pain       Pain Assessment: No/denies pain  Home Living                                           Prior Functioning/Environment              Frequency Min 2X/week     Progress Toward Goals  OT Goals(current goals can now be found in the care plan section)  Progress towards OT goals: Goals met/education completed, patient discharged from [place] Discharge plan remains appropriate    Co-evaluation                 End of Session     Activity  Tolerance Patient tolerated treatment well   Patient Left in bed;with call bell/phone within reach;with family/visitor present   Nurse Communication Mobility status        Time: 5631-4970 OT Time Calculation (min): 8 min  Charges: OT General Charges $OT Visit: 1 Procedure OT Treatments $Neuromuscular Re-education: 8-22 mins  Conarpe, Wendi M 04/01/2016, 1:35 PM

## 2016-04-01 NOTE — Discharge Summary (Addendum)
Stroke Discharge Summary  [name]   MRN: [id number]      DOB: 04-06-1966  Date of Admission: 03/30/2016 Date of Discharge: 04/01/2016  Attending Physician:  Garvin Fila, MD, Stroke MD Patient's PCP:  Penni Homans, MD  DISCHARGE DIAGNOSIS:  Principal Problem:   ICH (intracerebral hemorrhage) ([place]) - right frontoparietal ICH, likely secondary to hypertensive source, though odd location for hemorrhage Active Problems:   Diabetes mellitus type II, uncontrolled ([place])   Migraine without aura   Hypertensive Emergency   Essential hypertension, benign   RESTLESS LEG SYNDROME   Hyperlipidemia   Diabetic ulcer of lower extremity ([place])   Nonhealing nonsurgical wound   Nausea with vomiting  Overweight, BMI: Body mass index is 26.7 kg/m.  Past Medical History:  Diagnosis Date  . Acute bronchitis 07/08/2011  . COMMON MIGRAINE 10/07/2010  . CVA (cerebral infarction) 04/19/2013  . Depression 12/18/2012  . Diabetes mellitus type II, uncontrolled ([place]) 10/07/2010   Qualifier: Diagnosis of  By: Charlett Blake MD, [name]    . Disturbance of skin sensation 10/07/2010  . DM 10/07/2010  . Essential hypertension, benign 10/07/2010  . Heart murmur   . HEART MURMUR, HX OF 10/07/2010  . Hyperlipidemia 12/06/2010  . Hypertension   . NEPHROLITHIASIS, HX OF 10/07/2010  . Overweight(278.02) 12/06/2010  . PERIPHERAL NEUROPATHY, FEET 10/07/2010  . PVD (peripheral vascular disease) ([place]) 01/21/2012  . RESTLESS LEG SYNDROME 10/25/2010  . SOB (shortness of breath) 04/01/2012  . Stroke ([place])   . Weakness of right side of body 04/19/2013   Past Surgical History:  Procedure Laterality Date  . None        Medication List    TAKE these medications   freestyle lancets DX- 250.02  Pts machine is Freestyle Freedom Lite   glucose blood test strip Commonly known as:  ONE TOUCH ULTRA TEST Use once daily to check blood sugar.  DX E11.9   HYDROcodone-acetaminophen 5-325 MG tablet Commonly known  as:  NORCO Take 1 tablet by mouth every 6 (six) hours as needed for moderate pain.   Insulin Glargine 100 UNIT/ML Solostar Pen Commonly known as:  LANTUS SOLOSTAR Inject 50 Units into the skin 2 (two) times daily.   Insulin Pen Needle 32G X 4 MM Misc Use as directed for insulin.DX E11.65   lisinopril 10 MG tablet Commonly known as:  PRINIVIL,ZESTRIL TAKE 1 TABLET (10 MG TOTAL) BY MOUTH 2 (TWO) TIMES DAILY.   metoCLOPramide 10 MG tablet Commonly known as:  REGLAN Take 1 tablet (10 mg total) by mouth every 8 (eight) hours as needed for nausea.   ondansetron 4 MG disintegrating tablet Commonly known as:  ZOFRAN ODT 4mg  ODT q4 hours prn nausea/vomit   UNABLE TO FIND Iodine patch: Apply every Tuesday and Friday until wounds heal (remove old patch first)       LABORATORY STUDIES CBC    Component Value Date/Time   WBC 9.2 03/30/2016 1055   RBC 4.26 03/30/2016 1055   HGB 11.6 (L) 03/30/2016 1055   HCT 35.3 (L) 03/30/2016 1055   PLT 434 (H) 03/30/2016 1055   MCV 82.9 03/30/2016 1055   MCH 27.2 03/30/2016 1055   MCHC 32.9 03/30/2016 1055   RDW 12.6 03/30/2016 1055   LYMPHSABS 1.7 03/30/2016 1055   MONOABS 0.8 03/30/2016 1055   EOSABS 0.1 03/30/2016 1055   BASOSABS 0.0 03/30/2016 1055   CMP    Component Value Date/Time   NA 136 03/30/2016 1318  K 3.9 03/30/2016 1318   CL 103 03/30/2016 1318   CO2 27 03/30/2016 1318   GLUCOSE 197 (H) 03/30/2016 1318   BUN 13 03/30/2016 1318   CREATININE 0.52 03/30/2016 1318   CREATININE 0.56 02/09/2014 0839   CALCIUM 8.9 03/30/2016 1318   PROT 7.3 03/30/2016 1318   ALBUMIN 3.0 (L) 03/30/2016 1318   AST 16 03/30/2016 1318   ALT 15 03/30/2016 1318   ALKPHOS 86 03/30/2016 1318   BILITOT 0.5 03/30/2016 1318   GFRNONAA >60 03/30/2016 1318   GFRAA >60 03/30/2016 1318   COAGS Lab Results  Component Value Date   INR 1.07 03/30/2016   Lipid Panel    Component Value Date/Time   CHOL 175 03/30/2016 1318   TRIG 70 03/30/2016 1318    HDL 38 (L) 03/30/2016 1318   CHOLHDL 4.6 03/30/2016 1318   VLDL 14 03/30/2016 1318   LDLCALC 123 (H) 03/30/2016 1318   HgbA1C  Lab Results  Component Value Date   HGBA1C 12.6 (H) 03/30/2016   Urinalysis    Component Value Date/Time   COLORURINE YELLOW 03/28/2016 1740   APPEARANCEUR CLOUDY (A) 03/28/2016 1740   LABSPEC 1.038 (H) 03/28/2016 1740   PHURINE 5.0 03/28/2016 1740   GLUCOSEU >1000 (A) 03/28/2016 1740   GLUCOSEU NEGATIVE 05/21/2015 0831   HGBUR SMALL (A) 03/28/2016 1740   BILIRUBINUR NEGATIVE 03/28/2016 1740   KETONESUR NEGATIVE 03/28/2016 1740   PROTEINUR 30 (A) 03/28/2016 1740   UROBILINOGEN 0.2 05/21/2015 0831   NITRITE NEGATIVE 03/28/2016 1740   LEUKOCYTESUR NEGATIVE 03/28/2016 1740   Alcohol Level    Component Value Date/Time   ETH <5 03/30/2016 1055    SIGNIFICANT DIAGNOSTIC STUDIES Ct Head Wo Contrast 03/31/2016 Right frontal hemorrhage unchanged. No new area of hemorrhage or mass-effect Bilateral white matter disease especially in the right parietal lobe is stable.  03/30/2016 Acute hemorrhagic infarction at the right frontoparietal junction vertex measuring 1.3 x 0.7 x 1.2 cm. Volume 0.6 cc. No mass effect.   Mr Jeri Cos Wo Contrast 03/31/2016 Subacute hematoma right frontal parietal lobe unchanged measuring 6 x 13 mm. No underlying tumor or vascular malformation. Negative for acute infarct. Chronic microvascular ischemic change in the white matter.   Mr Jodene Nam Head Wo Contrast 03/31/2016 Mild stenosis right posterior cerebral artery. Moderate stenosis in the temporal branch of the right middle cerebral artery. Mild stenosis of the left carotid terminus.   US Abdomen Complete 03/28/2016 Unremarkable abdominal ultrasound. No cholelithiasis or sonographic evidence for acute cholecystitis. No hydronephrosis.   2-D echocardiogram - Left ventricle: The cavity size was normal. There was mild concentric hypertrophy. Systolic function was normal. The estimated  ejection fraction was in the range of 60% to 65%. Wall motion was normal; there were no regional wall motion abnormalities. Doppler parameters are consistent with abnormal left ventricular relaxation (grade 1 diastolic dysfunction). Doppler parameters are consistent with high ventricular filling pressure. - Aortic valve: Transvalvular velocity was within the normal range. There was no stenosis. There was no regurgitation. - Mitral valve: There was no regurgitation. - Right ventricle: The cavity size was normal. Wall thickness was normal. Systolic function was normal. - Atrial septum: No defect or patent foramen ovale was identified by color flow Doppler. - Tricuspid valve: There was no regurgitation.  Carotid Doppler   There is 1-39% bilateral ICA stenosis. Vertebral artery flow is antegrade.       HISTORY OF PRESENT ILLNESS Tiffany Kirchner Davenportis an 49 y.o.femalewho had sudden onset of left arm ataxia  this am. (LKW 03/30/2016 0910a).No involvement of left face or leg. She has not been on any antiplatelet or anticoagulation. Her BP was as high as 230/140 per EMS. CT brain shows right frontal ICH. She was supposed to be on BP meds but not taking secondary to nausea and vomiting. She has had a spider bite wound in the right lower leg which is rather large and bandaged. Patient was not administered IV t-PA secondary to ich. She was admitted to the step down ICU for further evaluation and treatment.    HOSPITAL COURSE [name] is a 50 y.o. female with history of HTN presenting with left arm ataxia. CT showed a small right frontoparietal ICH. She did not receive IV t-PA due to [place].   Stroke:  Non-dominant right frontoparietal ICH, likely secondary to hypertensive source, though odd location for hemorrhage. No other source found.  MRI with and without contrast subacute right frontal parietal lobe, small hematoma. No vascular malformation or underlying tumor. No acute infarct. Small  vessel disease  MRA  mild diffuse atherosclerosis. No significant large vessel disease.  Carotid Doppler  no significant stenosis  2D Echo  EF 60-65%. No source of embolus.  LDL 123  HgbA1c 12.6  No antithrombotic prior to admission, now on No antithrombotic given acute hemorrhage.   Ongoing aggressive stroke risk factor management  Therapy recommendations:  OP OT, no PT  Disposition:  discharge home  Hypertensive Emergency   blood pressure 230/140 on arrival in setting of neurologic symptoms  BP improving  Home lisinopril resumed  Long-term BP goal normotensive  Diabetes type II  HgbA1c 12.6, goal < 7.0  Uncontrolled  Other Stroke Risk Factors  Former Cigarette smoker  Hx stroke/TIA ? 3 years ago per pt - affected R arm, slurred speech, recovered within 1 week  Family hx stroke (brother)  Migraines  Other Active Problems  Nausea and vomiting, on zofran. Pt feels N&V secondary to iodine patches recently added to leg wound regimen  LE wound, going to wound center. Recent addition of iodine patch, pt feels added to N&V. Discussed with wound RN who agrees it may be contributing.   DISCHARGE EXAM Blood pressure (!) 142/76, pulse 78, temperature 98.7 F (37.1 C), temperature source Oral, resp. rate 20, height 5\' 2"  (1.575 m), weight 146 lb (66.2 kg), last menstrual period 02/12/2011, SpO2 100 %.    Discharge Diet   Diet heart healthy/carb modified Room service appropriate? Yes; Fluid consistency: Thin liquids  DISCHARGE PLAN  Disposition:  Return home  Outpatient OT  Due to hemorrhage and risk of bleeding, do not take aspirin, aspirin-containing medications, or ibuprofen products at this time  Ongoing risk factor control by Primary Care Physician at time of discharge  Follow-up Penni Homans, MD in 2 weeks.  Follow-up with Dr. Tomi Likens, neurologist seen prior to admission  40 minutes were spent preparing discharge.  [place]  [place] for Pager information 04/01/2016 3:49 PM   I have personally examined this patient, reviewed notes, independently viewed imaging studies, participated in medical decision making and plan of care. I have made any additions or clarifications directly to the above note. Agree with note above.  Antony Contras, MD Medical Director [facility] Pager: [phone] 04/02/2016 5:45 PM

## 2016-04-01 NOTE — Assessment & Plan Note (Signed)
right frontoparietal ICH, likely secondary to hypertensive source, though odd location for hemorrhage

## 2016-04-01 NOTE — Progress Notes (Signed)
Preliminary results by tech - Carotid Duplex Completed. Mild intimal thickening in both internal carotid arteries without significant stenosis - 1-39%. Vertebral arteries demonstrated antegrade flow. Oda Cogan, BS, RDMS, RVT

## 2016-04-01 NOTE — Progress Notes (Signed)
Pt discharged from hospital per orders from MD. Pt and spouse educated on discharge instructions. Pt and spouse verbalized understanding of instructions. All questions and concerns were addressed. Pt's IV's were removed before discharge. Pt exited hospital via wheelchair.

## 2016-04-01 NOTE — Care Management Note (Addendum)
Case Management Note  Patient Details  Name: [name] MRN: [id number] Date of Birth: 09/09/1965  Subjective/Objective:                    Action/Plan: Pt discharging home with self care. OT recommending outpatient therapy. CM met with the patient and she stated that Dr Sethi instructed her to f/u with her primary neurologist for follow up therapy. However order placed for outpatient OT. Orders placed in EPIC.    Expected Discharge Date:                  Expected Discharge Plan:  Home/Self Care  In-House Referral:     Discharge planning Services  CM Consult  Post Acute Care Choice:    Choice offered to:     DME Arranged:    DME Agency:     HH Arranged:    HH Agency:     Status of Service:  Completed, signed off  If discussed at Long Length of Stay Meetings, dates discussed:    Additional Comments:  Kelli F Willard, RN 04/01/2016, 12:07 PM  

## 2016-04-02 DIAGNOSIS — E1140 Type 2 diabetes mellitus with diabetic neuropathy, unspecified: Secondary | ICD-10-CM | POA: Diagnosis not present

## 2016-04-02 DIAGNOSIS — I10 Essential (primary) hypertension: Secondary | ICD-10-CM | POA: Diagnosis not present

## 2016-04-02 DIAGNOSIS — L97811 Non-pressure chronic ulcer of other part of right lower leg limited to breakdown of skin: Secondary | ICD-10-CM | POA: Diagnosis not present

## 2016-04-02 DIAGNOSIS — E11622 Type 2 diabetes mellitus with other skin ulcer: Secondary | ICD-10-CM | POA: Diagnosis present

## 2016-04-04 DIAGNOSIS — E11622 Type 2 diabetes mellitus with other skin ulcer: Secondary | ICD-10-CM | POA: Diagnosis not present

## 2016-04-07 ENCOUNTER — Other Ambulatory Visit: Payer: Self-pay | Admitting: *Deleted

## 2016-04-07 DIAGNOSIS — E11622 Type 2 diabetes mellitus with other skin ulcer: Secondary | ICD-10-CM | POA: Diagnosis not present

## 2016-04-07 NOTE — Patient Outreach (Signed)
[name] Care Management  04/07/2016  [name] 10-19-1965 [id number]  EMMI-Stroke referral via Dashboard report for feeling worse overall; new problems.  Telephone call to patient; person who answered call stated patient was not available but would have her return call.  Plan: Will follow up.  Sherrin Daisy, RN BSN [place] Management Coordinator [facility] Care Management  [phone]

## 2016-04-08 ENCOUNTER — Other Ambulatory Visit: Payer: Self-pay | Admitting: *Deleted

## 2016-04-08 NOTE — Patient Outreach (Signed)
[place] [facility]) Care Management  04/08/2016  [name] 1966-09-08 [id number]  Telephone call attempt x 2 ; left HIPPA compliant voice mail requesting return call.   Plan: Will follow up.  Sherrin Daisy, RN BSN [place] Management Coordinator [facility] Care Management  [phone]

## 2016-04-09 ENCOUNTER — Other Ambulatory Visit: Payer: Self-pay | Admitting: *Deleted

## 2016-04-09 NOTE — Patient Outreach (Signed)
[place] [facility]) Care Management  04/09/2016  CORTNEE CAMPBELL 09/21/1965 [id number]  Telephone call x 3 to patient; left message on voice mail requesting call back.  Plan: Will close out case. Send to care management assistant to close case.     Sherrin Daisy, RN BSN [place] Management Coordinator [facility] Care Management  [phone]

## 2016-04-11 ENCOUNTER — Ambulatory Visit (INDEPENDENT_AMBULATORY_CARE_PROVIDER_SITE_OTHER): Payer: BLUE CROSS/BLUE SHIELD | Admitting: Family Medicine

## 2016-04-11 ENCOUNTER — Encounter (HOSPITAL_BASED_OUTPATIENT_CLINIC_OR_DEPARTMENT_OTHER): Payer: BLUE CROSS/BLUE SHIELD | Attending: Internal Medicine

## 2016-04-11 VITALS — BP 116/72 | HR 90 | Temp 98.3°F | Resp 16 | Ht 62.0 in | Wt 146.1 lb

## 2016-04-11 DIAGNOSIS — M60070 Infective myositis, right ankle: Secondary | ICD-10-CM | POA: Insufficient documentation

## 2016-04-11 DIAGNOSIS — I10 Essential (primary) hypertension: Secondary | ICD-10-CM

## 2016-04-11 DIAGNOSIS — E1165 Type 2 diabetes mellitus with hyperglycemia: Secondary | ICD-10-CM | POA: Diagnosis not present

## 2016-04-11 DIAGNOSIS — E785 Hyperlipidemia, unspecified: Secondary | ICD-10-CM | POA: Diagnosis not present

## 2016-04-11 DIAGNOSIS — T148XXA Other injury of unspecified body region, initial encounter: Secondary | ICD-10-CM

## 2016-04-11 DIAGNOSIS — IMO0001 Reserved for inherently not codable concepts without codable children: Secondary | ICD-10-CM

## 2016-04-11 DIAGNOSIS — E1142 Type 2 diabetes mellitus with diabetic polyneuropathy: Secondary | ICD-10-CM | POA: Insufficient documentation

## 2016-04-11 DIAGNOSIS — L97311 Non-pressure chronic ulcer of right ankle limited to breakdown of skin: Secondary | ICD-10-CM | POA: Diagnosis not present

## 2016-04-11 DIAGNOSIS — I679 Cerebrovascular disease, unspecified: Secondary | ICD-10-CM | POA: Diagnosis not present

## 2016-04-11 DIAGNOSIS — T148 Other injury of unspecified body region: Secondary | ICD-10-CM

## 2016-04-11 DIAGNOSIS — E11622 Type 2 diabetes mellitus with other skin ulcer: Secondary | ICD-10-CM | POA: Diagnosis not present

## 2016-04-11 DIAGNOSIS — E1151 Type 2 diabetes mellitus with diabetic peripheral angiopathy without gangrene: Secondary | ICD-10-CM | POA: Diagnosis not present

## 2016-04-11 NOTE — Patient Instructions (Signed)
Stroke Prevention Some medical conditions and behaviors are associated with an increased chance of having a stroke. You may prevent a stroke by making healthy choices and managing medical conditions. HOW CAN I REDUCE MY RISK OF HAVING A STROKE?   Stay physically active. Get at least 30 minutes of activity on most or all days.  Do not smoke. It may also be helpful to avoid exposure to secondhand smoke.  Limit alcohol use. Moderate alcohol use is considered to be:  No more than 2 drinks per day for men.  No more than 1 drink per day for nonpregnant women.  Eat healthy foods. This involves:  Eating 5 or more servings of fruits and vegetables a day.  Making dietary changes that address high blood pressure (hypertension), high cholesterol, diabetes, or obesity.  Manage your cholesterol levels.  Making food choices that are high in fiber and low in saturated fat, trans fat, and cholesterol may control cholesterol levels.  Take any prescribed medicines to control cholesterol as directed by your health care provider.  Manage your diabetes.  Controlling your carbohydrate and sugar intake is recommended to manage diabetes.  Take any prescribed medicines to control diabetes as directed by your health care provider.  Control your hypertension.  Making food choices that are low in salt (sodium), saturated fat, trans fat, and cholesterol is recommended to manage hypertension.  Ask your health care provider if you need treatment to lower your blood pressure. Take any prescribed medicines to control hypertension as directed by your health care provider.  If you are 18-39 years of age, have your blood pressure checked every 3-5 years. If you are 40 years of age or older, have your blood pressure checked every year.  Maintain a healthy weight.  Reducing calorie intake and making food choices that are low in sodium, saturated fat, trans fat, and cholesterol are recommended to manage  weight.  Stop drug abuse.  Avoid taking birth control pills.  Talk to your health care provider about the risks of taking birth control pills if you are over 35 years old, smoke, get migraines, or have ever had a blood clot.  Get evaluated for sleep disorders (sleep apnea).  Talk to your health care provider about getting a sleep evaluation if you snore a lot or have excessive sleepiness.  Take medicines only as directed by your health care provider.  For some people, aspirin or blood thinners (anticoagulants) are helpful in reducing the risk of forming abnormal blood clots that can lead to stroke. If you have the irregular heart rhythm of atrial fibrillation, you should be on a blood thinner unless there is a good reason you cannot take them.  Understand all your medicine instructions.  Make sure that other conditions (such as anemia or atherosclerosis) are addressed. SEEK IMMEDIATE MEDICAL CARE IF:   You have sudden weakness or numbness of the face, arm, or leg, especially on one side of the body.  Your face or eyelid droops to one side.  You have sudden confusion.  You have trouble speaking (aphasia) or understanding.  You have sudden trouble seeing in one or both eyes.  You have sudden trouble walking.  You have dizziness.  You have a loss of balance or coordination.  You have a sudden, severe headache with no known cause.  You have new chest pain or an irregular heartbeat. Any of these symptoms may represent a serious problem that is an emergency. Do not wait to see if the symptoms will   go away. Get medical help at once. Call your local emergency services (911 in [place] Do not drive yourself to the hospital.   This information is not intended to replace advice given to you by your health care provider. Make sure you discuss any questions you have with your health care provider.   Document Released: 10/02/2004 Document Revised: 09/15/2014 Document Reviewed:  02/25/2013 Elsevier Interactive Patient Education 2016 [employer]  

## 2016-04-14 DIAGNOSIS — E11622 Type 2 diabetes mellitus with other skin ulcer: Secondary | ICD-10-CM | POA: Diagnosis not present

## 2016-04-17 DIAGNOSIS — E11622 Type 2 diabetes mellitus with other skin ulcer: Secondary | ICD-10-CM | POA: Diagnosis not present

## 2016-04-21 DIAGNOSIS — E11622 Type 2 diabetes mellitus with other skin ulcer: Secondary | ICD-10-CM | POA: Diagnosis not present

## 2016-04-24 ENCOUNTER — Other Ambulatory Visit: Payer: Self-pay | Admitting: Family Medicine

## 2016-04-25 DIAGNOSIS — E11622 Type 2 diabetes mellitus with other skin ulcer: Secondary | ICD-10-CM | POA: Diagnosis not present

## 2016-04-27 DIAGNOSIS — I639 Cerebral infarction, unspecified: Secondary | ICD-10-CM | POA: Insufficient documentation

## 2016-04-27 DIAGNOSIS — I679 Cerebrovascular disease, unspecified: Secondary | ICD-10-CM | POA: Insufficient documentation

## 2016-04-27 NOTE — Assessment & Plan Note (Signed)
minimize simple carbs. Increase exercise as tolerated. Continue current meds increase insulin and minimize simple carbs

## 2016-04-27 NOTE — Assessment & Plan Note (Signed)
Well controlled, no changes to meds. Encouraged heart healthy diet such as the DASH diet and exercise as tolerated.  °

## 2016-04-27 NOTE — Progress Notes (Signed)
[name], female   DOB: September 22, 1965, 50 y.o.   MRN: [id number]   Subjective:    [name], female    DOB: Nov 07, 1965, 50 y.o.   MRN: [id number]  Chief Complaint  Patient presents with  . Hospitalization Follow-up    CVA; pt c/o excessive fatigue, doing PT at home, ASA is on Hold x2 Wks.    HPI Patient is in today for hospital follow up. She was recently hospitalized with a CVA, she continues to struggle with weakness and excessive fatigue. Is undergoing therapy at home. Continues with wound management for leg and has an appointment with neurology   Past Medical History:  Diagnosis Date  . Acute bronchitis 07/08/2011  . COMMON MIGRAINE 10/07/2010  . CVA (cerebral infarction) 04/19/2013  . Depression 12/18/2012  . Diabetes mellitus type II, uncontrolled ([place]) 10/07/2010   Qualifier: Diagnosis of  By: Charlett Blake MD, [name]    . Disturbance of skin sensation 10/07/2010  . DM 10/07/2010  . Essential hypertension, benign 10/07/2010  . Heart murmur   . HEART MURMUR, HX OF 10/07/2010  . Hyperlipidemia 12/06/2010  . Hypertension   . NEPHROLITHIASIS, HX OF 10/07/2010  . Overweight(278.02) 12/06/2010  . PERIPHERAL NEUROPATHY, FEET 10/07/2010  . PVD (peripheral vascular disease) ([place]) 01/21/2012  . RESTLESS LEG SYNDROME 10/25/2010  . SOB (shortness of breath) 04/01/2012  . Stroke ([place])   . Weakness of right side of body 04/19/2013    Past Surgical History:  Procedure Laterality Date  . None      Family History  Problem Relation Age of Onset  . Arthritis Mother   . Stroke Brother     previous smoker  . Alcohol abuse Brother     in remission  . Leukemia Brother   . Diabetes Paternal Grandmother   . Healthy Son     Social History   Social History  . Marital status: Single    Spouse name: N/A  . Number of children: N/A  . Years of education: N/A   Occupational History  . Not on file.   Social History Main Topics  . Smoking status: Former Smoker   Packs/day: 0.50    Quit date: 09/08/2005  . Smokeless tobacco: Never Used  . Alcohol use No  . Drug use: No  . Sexual activity: Yes    Partners: Male    Birth control/ protection: None   Other Topics Concern  . Not on file   Social History Narrative   Lives with husband in a two story home.  Has one child.     Works as a Glass blower/designer.     Education: high school.    Outpatient Medications Prior to Visit  Medication Sig Dispense Refill  . glucose blood (ONE TOUCH ULTRA TEST) test strip Use once daily to check blood sugar.  DX E11.9 100 each 11  . HYDROcodone-acetaminophen (NORCO) 5-325 MG tablet Take 1 tablet by mouth every 6 (six) hours as needed for moderate pain. 40 tablet 0  . Insulin Glargine (LANTUS SOLOSTAR) 100 UNIT/ML Solostar Pen Inject 50 Units into the skin 2 (two) times daily. 15 mL 4  . Insulin Pen Needle 32G X 4 MM MISC Use as directed for insulin.DX E11.65 100 each 6  . Lancets (FREESTYLE) lancets DX- 250.02  Pts machine is Freestyle Freedom Lite 100 each 5  . lisinopril (PRINIVIL,ZESTRIL) 10 MG tablet TAKE 1 TABLET (10 MG TOTAL) BY MOUTH 2 (TWO) TIMES DAILY. 60 tablet 6  .  metFORMIN (GLUCOPHAGE) 1000 MG tablet TAKE 1 TABLET TWICE DAILY AND 1/2 TABLET AT NOON 75 tablet 1  . metoCLOPramide (REGLAN) 10 MG tablet Take 1 tablet (10 mg total) by mouth every 8 (eight) hours as needed for nausea. 15 tablet 0  . ondansetron (ZOFRAN ODT) 4 MG disintegrating tablet 4mg  ODT q4 hours prn nausea/vomit 10 tablet 0  . UNABLE TO FIND Iodine patch: Apply every Tuesday and Friday until wounds heal (remove old patch first)     No facility-administered medications prior to visit.     Allergies  Allergen Reactions  . Lyrica [Pregabalin] Other (See Comments)    MADE PATIENT VERY EMOTIONAL AND WOULD CRY EASILY  . Tramadol Nausea And Vomiting    Pt cant tolerate this pain med.   . Morphine And Related Nausea And Vomiting    Review of Systems  Constitutional: Positive for  malaise/fatigue. Negative for fever.  HENT: Negative for congestion.   Eyes: Negative for blurred vision.  Respiratory: Negative for shortness of breath.   Cardiovascular: Negative for chest pain, palpitations and leg swelling.  Gastrointestinal: Positive for nausea and vomiting. Negative for abdominal pain and blood in stool.  Genitourinary: Negative for dysuria and frequency.  Musculoskeletal: Negative for falls.  Skin: Negative for rash.  Neurological: Positive for focal weakness. Negative for dizziness, loss of consciousness and headaches.  Endo/Heme/Allergies: Negative for environmental allergies.  Psychiatric/Behavioral: Negative for depression. The patient is not nervous/anxious.        Objective:    Physical Exam  Constitutional: She is oriented to person, place, and time. She appears well-developed and well-nourished. No distress.  HENT:  Head: Normocephalic and atraumatic.  Nose: Nose normal.  Eyes: Right eye exhibits no discharge. Left eye exhibits no discharge.  Neck: Normal range of motion. Neck supple.  Cardiovascular: Normal rate and regular rhythm.   No murmur heard. Pulmonary/Chest: Effort normal and breath sounds normal.  Abdominal: Soft. Bowel sounds are normal. There is no tenderness.  Musculoskeletal: She exhibits no edema.  Neurological: She is alert and oriented to person, place, and time.  Skin: Skin is warm and dry.  Psychiatric: She has a normal mood and affect.  Vitals reviewed.   BP 116/72 (BP Location: Left Arm, Patient Position: Sitting, Cuff Size: Normal)   Pulse 90   Temp 98.3 F (36.8 C) (Oral)   Resp 16   Ht 5\' 2"  (1.575 m)   Wt 146 lb 2 oz (66.3 kg)   LMP 02/12/2011   SpO2 99%   BMI 26.73 kg/m  Wt Readings from Last 3 Encounters:  04/11/16 146 lb 2 oz (66.3 kg)  03/31/16 146 lb (66.2 kg)  03/28/16 146 lb (66.2 kg)     Lab Results  Component Value Date   WBC 9.2 03/30/2016   HGB 11.6 (L) 03/30/2016   HCT 35.3 (L) 03/30/2016    PLT 434 (H) 03/30/2016   GLUCOSE 197 (H) 03/30/2016   CHOL 175 03/30/2016   TRIG 70 03/30/2016   HDL 38 (L) 03/30/2016   LDLDIRECT 136.4 04/01/2012   LDLCALC 123 (H) 03/30/2016   ALT 15 03/30/2016   AST 16 03/30/2016   NA 136 03/30/2016   K 3.9 03/30/2016   CL 103 03/30/2016   CREATININE 0.52 03/30/2016   BUN 13 03/30/2016   CO2 27 03/30/2016   TSH 2.15 11/20/2015   INR 1.07 03/30/2016   HGBA1C 12.6 (H) 03/30/2016   MICROALBUR 1.1 12/22/2011    Lab Results  Component Value Date  TSH 2.15 11/20/2015   Lab Results  Component Value Date   WBC 9.2 03/30/2016   HGB 11.6 (L) 03/30/2016   HCT 35.3 (L) 03/30/2016   MCV 82.9 03/30/2016   PLT 434 (H) 03/30/2016   Lab Results  Component Value Date   NA 136 03/30/2016   K 3.9 03/30/2016   CO2 27 03/30/2016   GLUCOSE 197 (H) 03/30/2016   BUN 13 03/30/2016   CREATININE 0.52 03/30/2016   BILITOT 0.5 03/30/2016   ALKPHOS 86 03/30/2016   AST 16 03/30/2016   ALT 15 03/30/2016   PROT 7.3 03/30/2016   ALBUMIN 3.0 (L) 03/30/2016   CALCIUM 8.9 03/30/2016   ANIONGAP 6 03/30/2016   GFR 107.46 11/27/2015   Lab Results  Component Value Date   CHOL 175 03/30/2016   Lab Results  Component Value Date   HDL 38 (L) 03/30/2016   Lab Results  Component Value Date   LDLCALC 123 (H) 03/30/2016   Lab Results  Component Value Date   TRIG 70 03/30/2016   Lab Results  Component Value Date   CHOLHDL 4.6 03/30/2016   Lab Results  Component Value Date   HGBA1C 12.6 (H) 03/30/2016       Assessment & Plan:   Problem List Items Addressed This Visit    Diabetes mellitus type II, uncontrolled ([place])    minimize simple carbs. Increase exercise as tolerated. Continue current meds increase insulin and minimize simple carbs      Relevant Medications   aspirin EC 81 MG tablet   Essential hypertension, benign    Well controlled, no changes to meds. Encouraged heart healthy diet such as the DASH diet and exercise as tolerated.         Relevant Medications   aspirin EC 81 MG tablet   Hyperlipidemia    Encouraged heart healthy diet, increase exercise, avoid trans fats, consider a krill oil cap daily      Relevant Medications   aspirin EC 81 MG tablet   Nonhealing nonsurgical wound    Continues to follow with wound management.       Cerebrovascular disease - Primary    Patient recently hospitalized with a CVA is scheduled with neurology later this month. Is encouraged to at least cut down on her work if not take a leave. She declines for now but will maintain FMLA. Continue current meds.       Relevant Medications   aspirin EC 81 MG tablet   Other Relevant Orders   Ambulatory referral to Neurology    Other Visit Diagnoses   None.     I have discontinued Ms. Spieler's UNABLE TO FIND. I am also having her maintain her freestyle, Insulin Glargine, glucose blood, Insulin Pen Needle, HYDROcodone-acetaminophen, ondansetron, metoCLOPramide, lisinopril, metFORMIN, and aspirin EC.  Meds ordered this encounter  Medications  . aspirin EC 81 MG tablet    Sig: Take 81 mg by mouth daily.     Penni Homans, MD

## 2016-04-27 NOTE — Assessment & Plan Note (Signed)
Patient recently hospitalized with a CVA is scheduled with neurology later this month. Is encouraged to at least cut down on her work if not take a leave. She declines for now but will maintain FMLA. Continue current meds.

## 2016-04-27 NOTE — Assessment & Plan Note (Signed)
Encouraged heart healthy diet, increase exercise, avoid trans fats, consider a krill oil cap daily 

## 2016-04-27 NOTE — Assessment & Plan Note (Signed)
Continues to follow with wound management.

## 2016-04-29 DIAGNOSIS — E11622 Type 2 diabetes mellitus with other skin ulcer: Secondary | ICD-10-CM | POA: Diagnosis not present

## 2016-05-02 DIAGNOSIS — E11622 Type 2 diabetes mellitus with other skin ulcer: Secondary | ICD-10-CM | POA: Diagnosis not present

## 2016-05-05 DIAGNOSIS — E11622 Type 2 diabetes mellitus with other skin ulcer: Secondary | ICD-10-CM | POA: Diagnosis not present

## 2016-05-07 ENCOUNTER — Ambulatory Visit (INDEPENDENT_AMBULATORY_CARE_PROVIDER_SITE_OTHER): Payer: BLUE CROSS/BLUE SHIELD | Admitting: Neurology

## 2016-05-07 ENCOUNTER — Encounter: Payer: Self-pay | Admitting: Neurology

## 2016-05-07 VITALS — BP 110/70 | HR 91 | Ht 62.0 in | Wt 145.4 lb

## 2016-05-07 DIAGNOSIS — I610 Nontraumatic intracerebral hemorrhage in hemisphere, subcortical: Secondary | ICD-10-CM

## 2016-05-07 DIAGNOSIS — I951 Orthostatic hypotension: Secondary | ICD-10-CM

## 2016-05-07 DIAGNOSIS — E1142 Type 2 diabetes mellitus with diabetic polyneuropathy: Secondary | ICD-10-CM | POA: Insufficient documentation

## 2016-05-07 DIAGNOSIS — E0842 Diabetes mellitus due to underlying condition with diabetic polyneuropathy: Secondary | ICD-10-CM | POA: Diagnosis not present

## 2016-05-07 NOTE — Patient Instructions (Addendum)
1.  Start aspirin 81mg  daily 2.  Stressed the importance of medication compliance 3.  Follow blood sugars closely, especially when lightheaded 4.  Increase water intake   Return to clinic as needed

## 2016-05-07 NOTE — Progress Notes (Signed)
Follow-up Visit   Date: 05/07/16   [name] MRN: [id number] DOB: 28-Oct-1965   Interim History: [name] is a 51 y.o. right-handed Caucasian female with hypertension, hyperlipidemia, insulin dependent diabetes mellitus (HbA1c 10.6), former tobacco use, prior left pontine stroke (2014, mild residual right hand weakness) returning to the clinic for follow-up of new complaints of right parietal ICH.  She had previously seen me for diabetic neuropathy.  History of present illness: Starting around 2014, she started having numbness which started in her feet and have slowly progressed to involve her lower ankle region. There is no burning or tingling sensation. She endorses difficulty with her balance. Her diabetes has always remained relatively uncontrolled. She was started on Lyrica, but developed cognitive side effects with this was stopped.  In the fall of 2015, she noticed numbness of the right last three fingers with mild extension into her medial forearm. She has associated weaker grip, such as when holding utensils. She denies any neck pain. She has baseline weakness of the right upper and lower extremities and endorses slight worsening with her new onset of hand paresthesias.  She was last seen by Dr. Tomi Likens in August 2014 for follow-up of left pontine lacunar infarct. At that time she was continued on aspirin and statin therapy.  UPDATE 03/21/2015:  Patient had her EMG yesterday and is here to discuss the results.  NCS/EMG shows essentially absent sensory responses in both upper extremities as well as marked demyelinating features involving all the motor nerves testing bilaterally, worse on the right.  She has noticed greater difficultly manipulating small objects on her right hand.  Her numbness remains unchanged in her hands, again worse over the last two fingers on the right, but she has noticed it starting to creep into her left hand also.  She has had two falls since  her last visit because of imbalance and continues to walk independently.  Her husband says that she has a lot of stiffness when she first starts working in the morning, but this improves as the day goes on.  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    UPDATE 05/07/2016:   She reports having 3-day history nausea and vomiting and then developed left arm ataxia, which prompted her to go to the ER on 7/23. CT showed a small right frontoparietal ICH. Blood pressure 230/140 on arrival. It was suspected that she had hypertensive bleed, but the location is atypical.  She admits to sparingly taking her medications, maybe 1-2 times per week, if that.  Since this event, she has been much more compliant with her medications.  She has been doing home exercises and reports improved strength and coordination is about 80% back.  She reports having spells of lightheaded which last about 45-minutes.  This occurs very sporadically, sometimes twice in one day and then she may not have another spell for 3 days.  She has no new weakness.    Medications:  Current Outpatient Prescriptions on File Prior to Visit  Medication Sig Dispense Refill  . aspirin EC 81 MG tablet Take 81 mg by mouth daily.    Marland Kitchen glucose blood (ONE TOUCH ULTRA TEST) test strip Use once daily to check blood sugar.  DX E11.9 100 each 11  . Insulin Glargine (LANTUS SOLOSTAR) 100 UNIT/ML Solostar Pen Inject 50 Units into the skin 2 (two) times daily. 15 mL 4  . Insulin Pen Needle  32G X 4 MM MISC Use as directed for insulin.DX E11.65 100 each 6  . Lancets (FREESTYLE) lancets DX- 250.02  Pts machine is Freestyle Freedom Lite 100 each 5  . LANTUS SOLOSTAR 100 UNIT/ML Solostar Pen INJECT 34 UNITS IN AM & 32 UNITS IN PM. INCREASE BY 2 UNITS EVERY 3 DAYS AS LONG AS ABOVE 100 15 mL 4  . lisinopril (PRINIVIL,ZESTRIL) 10 MG tablet TAKE 1 TABLET (10 MG TOTAL) BY MOUTH 2 (TWO) TIMES DAILY. 60 tablet 6  .  metFORMIN (GLUCOPHAGE) 1000 MG tablet TAKE 1 TABLET TWICE DAILY AND 1/2 TABLET AT NOON 75 tablet 1  . metoCLOPramide (REGLAN) 10 MG tablet Take 1 tablet (10 mg total) by mouth every 8 (eight) hours as needed for nausea. 15 tablet 0   No current facility-administered medications on file prior to visit.     Allergies:  Allergies  Allergen Reactions  . Lyrica [Pregabalin] Other (See Comments)    MADE PATIENT VERY EMOTIONAL AND WOULD CRY EASILY  . Tramadol Nausea And Vomiting    Pt cant tolerate this pain med.   . Morphine And Related Nausea And Vomiting    Review of Systems:  CONSTITUTIONAL: No fevers, chills, night sweats, or weight loss.  EYES: No visual changes or eye pain ENT: No hearing changes.  No history of nose bleeds.   RESPIRATORY: No cough, wheezing and shortness of breath.   CARDIOVASCULAR: Negative for chest pain, and palpitations.   GI: Negative for abdominal discomfort, blood in stools or black stools.  No recent change in bowel habits.   GU:  No history of incontinence.   MUSCLOSKELETAL: No history of joint pain or swelling.  No myalgias.   SKIN: Negative for lesions, rash, and itching.   ENDOCRINE: Negative for cold or heat intolerance, polydipsia or goiter.   PSYCH:  No depression or anxiety symptoms.   NEURO: As Above.   Vital Signs:  BP 110/70   Pulse 91   Ht 5\' 2"  (1.575 m)   Wt 145 lb 6 oz (65.9 kg)   LMP 02/12/2011   SpO2 96%   BMI 26.59 kg/m  Orthostatic VS for the past 24 hrs (Last 3 readings):  BP- Lying Pulse- Lying BP- Sitting Pulse- Sitting BP- Standing at 0 minutes Pulse- Standing at 0 minutes  05/07/16 0959 140/84 85 120/70 85 108/68 86   Neurological Exam: MENTAL STATUS including orientation to time, place, person, recent and remote memory, attention span and concentration, language, and fund of knowledge is normal.  Speech is not dysarthric.  CRANIAL NERVES:  Pupils equal round and reactive to light.  Normal conjugate, extra-ocular eye  movements in all directions of gaze. Mild right ptosis (old). Normal facial sensation.  Face is symmetric. Palate elevates symmetrically.  Tongue is midline.  MOTOR:  No atrophy, fasciculations or abnormal movements.  No pronator drift.  Tone is normal.    Right Upper Extremity:    Left Upper Extremity:    Deltoid  5/5   Deltoid  5/5   Biceps  5/5   Biceps  5/5   Triceps  5/5   Triceps  5/5   Wrist extensors  5/5   Wrist extensors  5/5   Wrist flexors  5/5   Wrist flexors  5/5   Finger extensors  5/5   Finger extensors  5/5   Finger flexors  5/5   Finger flexors  5/5   Dorsal interossei  4/5   Dorsal interossei  4/5   Abductor  pollicis  5-/5   Abductor pollicis  5-/5   Tone (Ashworth scale)  0  Tone (Ashworth scale)  0   Right Lower Extremity:    Left Lower Extremity:    Hip flexors  5/5   Hip flexors  5/5   Hip extensors  5/5   Hip extensors  5/5   Knee flexors  5/5   Knee flexors  5/5   Knee extensors  5/5   Knee extensors  5/5   Dorsiflexors  5/5   Dorsiflexors  5/5   Plantarflexors  5/5   Plantarflexors  5/5   Toe extensors  5/5   Toe extensors  5/5   Toe flexors  5/5   Toe flexors  5/5   Tone (Ashworth scale)  0  Tone (Ashworth scale)  0   MSRs:  Right                                                                 Left brachioradialis 3+  brachioradialis 2+  biceps 3+  biceps 2+  triceps 3+  triceps 2+  patellar 3+  patellar 2+  ankle jerk 2+  ankle jerk 1+  Hoffman no  Hoffman no  plantar response down  plantar response down   SENSORY: Pinprick, light touch, vibration, and temperature is reduced the mid calf bilaterally. Pinprick in the upper extremity is reduced in the right hand over the ulnar distribution. Romberg sign is positive.   COORDINATION/GAIT:  Normal finger-to- nose-finger and heel-to-shin.  Intact rapid alternating movements bilaterally.  Gait narrow based and stable, mild unsteadiness with turns.   Data: Labs 03/02/2015:  Copper 134, vitamin B12  579 Labs 06/13/2014:  TSH 2.69, HbA1c 10.6* CSF 07/05/2015:  R4 W 1 G229* P98*   IgG index 0.41, one OCB, MBP <2.0, cytology neg, cryptococcus neg, ACE 13, Lyme neg  MRI brain 04/28/2013: 1. Signal abnormality strongly suggestive of an acute/subacute lacunar infarct in the left brainstem at the lower pons. No associated mass effect or hemorrhage.  2. Other evidence of chronic small vessel disease; probable chronic lacunar infarct in the left thalamus, and age advanced nonspecific subcortical cerebral white matter signal changes.  EMG upper extremities 03/20/2015: 1. The electrophysiologic findings are most consistent with a chronic generalized sensorimotor polyneuropathy, axon loss and demyelinating in type, affecting the upper extremities. Overall, these findings are severe in degree electrically. With the patchy distribution of findings, a polyradiculoneuropathy cannot be excluded. Clinical correlation recommended. 2. A superimposed right ulnar neuropathy proximal to the takeoff to the flexor digitorum profundus 4 &5 muscle is also likely. 3. Chronic C5 radiculopathy affecting the right upper extremity, mild in degree electrically.  MRI cervical spine wwo contast 04/06/2015:  Mild cervical spondylosis. Mild central stenosis at C6-C7 associated with a LEFT paracentral and posterior lateral shallow disc protrusion.  MRI brain 04/23/2015:  No acute intracranial finding. Chronic small-vessel ischemic changes affecting the pons and cerebral hemispheric white matter, premature for age.  CT Head Wo Contrast 03/31/2016 Right frontal hemorrhage unchanged. No new area of hemorrhage or mass-effect Bilateral white matter disease especially in the right parietal lobe is stable.  03/30/2016 Acute hemorrhagic infarction at the right frontoparietal junction vertex measuring 1.3 x 0.7 x 1.2 cm. Volume 0.6 cc. No mass effect.  MRI Brain W Wo Contrast 03/31/2016 Subacute hematoma right frontal parietal lobe  unchanged measuring 6 x 13 mm. No underlying tumor or vascular malformation. Negative for acute infarct. Chronic microvascular ischemic change in the white matter.    MRA Head Wo Contrast 03/31/2016 Mild stenosis right posterior cerebral artery. Moderate stenosis in the temporal branch of the right middle cerebral artery. Mild stenosis of the left carotid terminus.    US Abdomen Complete 03/28/2016 Unremarkable abdominal ultrasound. No cholelithiasis or sonographic evidence for acute cholecystitis. No hydronephrosis.    2-D echocardiogram 04/01/2016:  Left ventricle: mild concentric hypertrophy. EF 60-65%, no WMA; grade 1 diastolic dysfunction, no PFO or defect   Carotid Doppler 04/01/2016:  1-39% bilateral ICA stenosis. Vertebral artery flow is antegrade  IMPRESSION/PLAN: Right parietal hypertensive ICH manifesting with left arm ataxia, clinically doing much better with only minimal symptoms.  Because she also has history of ischemic pontine stroke in 2014 causing right hand weakness, I will restart her antiplatelet therapy now that she is > 4 weeks out from her [place].   Diabetic polyneuropathy, demyelinating and axonal, affecting the hands and feet, stable - She has underwent extensive work-up including CSF testing to exclude polyradiculoneuropathy which showed markedly elevated CSF glucose (229) and protein (98), but I suspect protein increase is due to osmotic shifts with her elevated glucose and less likely an inflammatory response. - Again, the importance of tight glucose management was stressed.  She admits to being noncompliant with her medications and since having this stroke, has been better about taking her medications.    Orthostatic hypotension manifesting with lightheadedness.  Her blood pressure dropped from SBP 140 > 108 without compensatory increase in HR suggesting dysautonomia.  She does not drink any water during the day and prefers Gatorade.  I explained that regular Gatorade has a  lot of sugar and she should try sugar-free or simply increase her water intake.     PLAN/RECOMMENDATIONS:  1.  Start aspirin 81mg  daily for secondary prevention of ischemic stroke, she is >4 weeks from her [place] and neurologically improved 2.  Stressed the importance of medication compliance 3.  Follow blood sugars closely, especially when lightheaded 4.  Increase water intake and compression stockings recommended 5.  Stroke warning signs discussed  Return to clinic as needed   The duration of this appointment visit was 40 minutes of face-to-face time with the patient.  Greater than 50% of this time was spent in counseling, explanation of diagnosis, planning of further management, and coordination of care.   Thank you for allowing me to participate in patient's care.  If I can answer any additional questions, I would be pleased to do so.    Sincerely,    Donika K. Posey Pronto, DO

## 2016-05-09 ENCOUNTER — Encounter (HOSPITAL_BASED_OUTPATIENT_CLINIC_OR_DEPARTMENT_OTHER): Payer: BLUE CROSS/BLUE SHIELD | Attending: Internal Medicine

## 2016-05-09 DIAGNOSIS — E11622 Type 2 diabetes mellitus with other skin ulcer: Secondary | ICD-10-CM | POA: Diagnosis not present

## 2016-05-09 DIAGNOSIS — I10 Essential (primary) hypertension: Secondary | ICD-10-CM | POA: Insufficient documentation

## 2016-05-09 DIAGNOSIS — L97811 Non-pressure chronic ulcer of other part of right lower leg limited to breakdown of skin: Secondary | ICD-10-CM | POA: Insufficient documentation

## 2016-05-09 DIAGNOSIS — E1140 Type 2 diabetes mellitus with diabetic neuropathy, unspecified: Secondary | ICD-10-CM | POA: Insufficient documentation

## 2016-05-11 ENCOUNTER — Other Ambulatory Visit: Payer: Self-pay | Admitting: Family Medicine

## 2016-05-13 ENCOUNTER — Ambulatory Visit: Payer: BLUE CROSS/BLUE SHIELD | Admitting: Physician Assistant

## 2016-05-13 DIAGNOSIS — E11622 Type 2 diabetes mellitus with other skin ulcer: Secondary | ICD-10-CM | POA: Diagnosis not present

## 2016-05-14 ENCOUNTER — Encounter: Payer: Self-pay | Admitting: Physician Assistant

## 2016-05-14 ENCOUNTER — Ambulatory Visit (INDEPENDENT_AMBULATORY_CARE_PROVIDER_SITE_OTHER): Payer: BLUE CROSS/BLUE SHIELD | Admitting: Physician Assistant

## 2016-05-14 VITALS — BP 100/70 | HR 88 | Temp 98.3°F | Resp 16 | Ht 62.0 in | Wt 145.4 lb

## 2016-05-14 DIAGNOSIS — M545 Low back pain, unspecified: Secondary | ICD-10-CM

## 2016-05-14 DIAGNOSIS — M25552 Pain in left hip: Secondary | ICD-10-CM

## 2016-05-14 MED ORDER — METHYLPREDNISOLONE 4 MG PO TBPK: ORAL_TABLET | ORAL | 0 refills | Status: DC

## 2016-05-14 MED ORDER — HYDROCODONE-ACETAMINOPHEN 10-325 MG PO TABS: 1.0000 | ORAL_TABLET | Freq: Three times a day (TID) | ORAL | 0 refills | Status: DC | PRN

## 2016-05-14 MED ORDER — CYCLOBENZAPRINE HCL 10 MG PO TABS: 10.0000 mg | ORAL_TABLET | Freq: Three times a day (TID) | ORAL | 0 refills | Status: DC | PRN

## 2016-05-14 NOTE — Patient Instructions (Signed)
Please take the steroid pack as directed.  Use the Flexeril as directed but do not drive or operate machinery on this medication.   Pain medication as directed. No heavy lifting or overexertion. Rest and relax the rest of the week.  Follow-up if symptoms are not resolving.

## 2016-05-14 NOTE — Progress Notes (Signed)
Patient presents to clinic today c/o 1 week of left lower back pain and hip pain that is a deep, aching pain, brought on only with ambulation and alleviated with rest. Denies trauma or injury. Denies radiation into lower extremities. Denies numbness, tingling or weakness. Does note occasional sharp pain shooting from lower back into hip. Has taken OTC pain medications with little relief in symptoms. Patient recently suffered a CVA in July. Is no longer in OT or PT.  Past Medical History:  Diagnosis Date  . Acute bronchitis 07/08/2011  . COMMON MIGRAINE 10/07/2010  . CVA (cerebral infarction) 04/19/2013  . Depression 12/18/2012  . Diabetes mellitus type II, uncontrolled ([place]) 10/07/2010   Qualifier: Diagnosis of  By: Charlett Blake MD, [name]    . Disturbance of skin sensation 10/07/2010  . DM 10/07/2010  . Essential hypertension, benign 10/07/2010  . Heart murmur   . HEART MURMUR, HX OF 10/07/2010  . Hyperlipidemia 12/06/2010  . Hypertension   . NEPHROLITHIASIS, HX OF 10/07/2010  . Overweight(278.02) 12/06/2010  . PERIPHERAL NEUROPATHY, FEET 10/07/2010  . PVD (peripheral vascular disease) ([place]) 01/21/2012  . RESTLESS LEG SYNDROME 10/25/2010  . SOB (shortness of breath) 04/01/2012  . Stroke ([place])   . Weakness of right side of body 04/19/2013    Current Outpatient Prescriptions on File Prior to Visit  Medication Sig Dispense Refill  . aspirin EC 81 MG tablet Take 81 mg by mouth daily.    Marland Kitchen glucose blood (ONE TOUCH ULTRA TEST) test strip Use once daily to check blood sugar.  DX E11.9 100 each 11  . Insulin Glargine (LANTUS SOLOSTAR) 100 UNIT/ML Solostar Pen Inject 50 Units into the skin 2 (two) times daily. 15 mL 4  . Insulin Pen Needle 32G X 4 MM MISC Use as directed for insulin.DX E11.65 100 each 6  . Lancets (FREESTYLE) lancets DX- 250.02  Pts machine is Freestyle Freedom Lite 100 each 5  . LANTUS SOLOSTAR 100 UNIT/ML Solostar Pen INJECT 34 UNITS IN AM & 32 UNITS IN PM. INCREASE BY 2 UNITS EVERY 3  DAYS AS LONG AS ABOVE 100 15 pen 4  . lisinopril (PRINIVIL,ZESTRIL) 10 MG tablet TAKE 1 TABLET (10 MG TOTAL) BY MOUTH 2 (TWO) TIMES DAILY. 60 tablet 6  . metFORMIN (GLUCOPHAGE) 1000 MG tablet TAKE 1 TABLET TWICE DAILY AND 1/2 TABLET AT NOON 75 tablet 1  . metoCLOPramide (REGLAN) 10 MG tablet Take 1 tablet (10 mg total) by mouth every 8 (eight) hours as needed for nausea. 15 tablet 0   No current facility-administered medications on file prior to visit.     Allergies  Allergen Reactions  . Lyrica [Pregabalin] Other (See Comments)    MADE PATIENT VERY EMOTIONAL AND WOULD CRY EASILY  . Tramadol Nausea And Vomiting    Pt cant tolerate this pain med.   . Morphine And Related Nausea And Vomiting    Family History  Problem Relation Age of Onset  . Arthritis Mother   . Stroke Brother     previous smoker  . Alcohol abuse Brother     in remission  . Leukemia Brother   . Diabetes Paternal Grandmother   . Healthy Son     Social History   Social History  . Marital status: Single    Spouse name: N/A  . Number of children: N/A  . Years of education: N/A   Social History Main Topics  . Smoking status: Former Smoker    Packs/day: 0.50    Quit date:  09/08/2005  . Smokeless tobacco: Never Used  . Alcohol use No  . Drug use: No  . Sexual activity: Yes    Partners: Male    Birth control/ protection: None   Other Topics Concern  . None   Social History Narrative   Lives with husband in a two story home.  Has one child.     Works as a Glass blower/designer.     Education: high school.    Review of Systems - See HPI.  All other ROS are negative.  BP 100/70 (BP Location: Left Arm, Patient Position: Sitting, Cuff Size: Normal)   Pulse 88   Temp 98.3 F (36.8 C) (Oral)   Resp 16   Ht 5' 2"  (1.575 m)   Wt 145 lb 6 oz (65.9 kg)   LMP 02/12/2011   SpO2 98%   BMI 26.59 kg/m   Physical Exam  Constitutional: She is oriented to person, place, and time and well-developed,  well-nourished, and in no distress.  HENT:  Head: Normocephalic and atraumatic.  Eyes: Conjunctivae are normal.  Neck: Neck supple.  Cardiovascular: Normal rate, regular rhythm, normal heart sounds and intact distal pulses.   Pulmonary/Chest: Effort normal.  Musculoskeletal:       Left hip: She exhibits tenderness. She exhibits normal range of motion and normal strength.       Lumbar back: She exhibits tenderness, pain and spasm. She exhibits no bony tenderness.  Neurological: She is alert and oriented to person, place, and time.  Skin: Skin is warm and dry. No rash noted.  Psychiatric: Affect normal.  Vitals reviewed.   Recent Results (from the past 2160 hour(s))  Blood culture (routine single)     Status: None   Collection Time: 03/03/16  3:27 PM  Result Value Ref Range   Organism ID, Bacteria NO GROWTH 5 DAYS     Comment: Culture results may be compromised due to an excessive volume of blood received in culture bottles.   CBC w/Diff     Status: Abnormal   Collection Time: 03/03/16  3:27 PM  Result Value Ref Range   WBC 9.8 4.0 - 10.5 K/uL   RBC 4.18 3.87 - 5.11 Mil/uL   Hemoglobin 11.8 (L) 12.0 - 15.0 g/dL   HCT 36.0 36.0 - 46.0 %   MCV 86.0 78.0 - 100.0 fl   MCHC 32.7 30.0 - 36.0 g/dL   RDW 13.1 11.5 - 15.5 %   Platelets 421.0 (H) 150.0 - 400.0 K/uL   Neutrophils Relative % 66.2 43.0 - 77.0 %   Lymphocytes Relative 23.5 12.0 - 46.0 %   Monocytes Relative 6.7 3.0 - 12.0 %   Eosinophils Relative 2.7 0.0 - 5.0 %   Basophils Relative 0.9 0.0 - 3.0 %   Neutro Abs 6.5 1.4 - 7.7 K/uL   Lymphs Abs 2.3 0.7 - 4.0 K/uL   Monocytes Absolute 0.7 0.1 - 1.0 K/uL   Eosinophils Absolute 0.3 0.0 - 0.7 K/uL   Basophils Absolute 0.1 0.0 - 0.1 K/uL  Culture, blood (single) w Reflex to ID Panel     Status: None   Collection Time: 03/03/16  3:27 PM  Result Value Ref Range   Organism ID, Bacteria NO GROWTH 5 DAYS     Comment: Culture results may be compromised due to an excessive  volume of blood received in culture bottles.   Urinalysis, Routine w reflex microscopic     Status: Abnormal   Collection Time: 03/28/16  5:40 PM  Result Value Ref Range   Color, Urine YELLOW YELLOW   APPearance CLOUDY (A) CLEAR   Specific Gravity, Urine 1.038 (H) 1.005 - 1.030   pH 5.0 5.0 - 8.0   Glucose, UA >1000 (A) NEGATIVE mg/dL   Hgb urine dipstick SMALL (A) NEGATIVE   Bilirubin Urine NEGATIVE NEGATIVE   Ketones, ur NEGATIVE NEGATIVE mg/dL   Protein, ur 30 (A) NEGATIVE mg/dL   Nitrite NEGATIVE NEGATIVE   Leukocytes, UA NEGATIVE NEGATIVE  Urine microscopic-add on     Status: Abnormal   Collection Time: 03/28/16  5:40 PM  Result Value Ref Range   Squamous Epithelial / LPF 0-5 (A) NONE SEEN   WBC, UA 0-5 0 - 5 WBC/hpf   RBC / HPF 0-5 0 - 5 RBC/hpf   Bacteria, UA FEW (A) NONE SEEN   Casts HYALINE CASTS (A) NEGATIVE    Comment: GRANULAR CAST   Urine-Other MUCOUS PRESENT   Comprehensive metabolic panel     Status: Abnormal   Collection Time: 03/28/16  6:18 PM  Result Value Ref Range   Sodium 134 (L) 135 - 145 mmol/L   Potassium 4.1 3.5 - 5.1 mmol/L   Chloride 98 (L) 101 - 111 mmol/L   CO2 28 22 - 32 mmol/L   Glucose, Bld 309 (H) 65 - 99 mg/dL   BUN 21 (H) 6 - 20 mg/dL   Creatinine, Ser 0.67 0.44 - 1.00 mg/dL   Calcium 9.0 8.9 - 10.3 mg/dL   Total Protein 7.8 6.5 - 8.1 g/dL   Albumin 3.3 (L) 3.5 - 5.0 g/dL   AST 15 15 - 41 U/L   ALT 16 14 - 54 U/L   Alkaline Phosphatase 98 38 - 126 U/L   Total Bilirubin 0.5 0.3 - 1.2 mg/dL   GFR calc non Af Amer >60 >60 mL/min   GFR calc Af Amer >60 >60 mL/min    Comment: (NOTE) The eGFR has been calculated using the CKD EPI equation. This calculation has not been validated in all clinical situations. eGFR's persistently <60 mL/min signify possible Chronic Kidney Disease.    Anion gap 8 5 - 15  Lipase, blood     Status: None   Collection Time: 03/28/16  6:18 PM  Result Value Ref Range   Lipase 25 11 - 51 U/L  CBC with  Differential     Status: Abnormal   Collection Time: 03/28/16  6:18 PM  Result Value Ref Range   WBC 8.2 4.0 - 10.5 K/uL   RBC 4.46 3.87 - 5.11 MIL/uL   Hemoglobin 12.5 12.0 - 15.0 g/dL   HCT 37.3 36.0 - 46.0 %   MCV 83.6 78.0 - 100.0 fL   MCH 28.0 26.0 - 34.0 pg   MCHC 33.5 30.0 - 36.0 g/dL   RDW 12.7 11.5 - 15.5 %   Platelets 505 (H) 150 - 400 K/uL   Neutrophils Relative % 66 %   Neutro Abs 5.4 1.7 - 7.7 K/uL   Lymphocytes Relative 22 %   Lymphs Abs 1.8 0.7 - 4.0 K/uL   Monocytes Relative 10 %   Monocytes Absolute 0.8 0.1 - 1.0 K/uL   Eosinophils Relative 2 %   Eosinophils Absolute 0.2 0.0 - 0.7 K/uL   Basophils Relative 0 %   Basophils Absolute 0.0 0.0 - 0.1 K/uL  I-stat troponin, ED (not at [facility], [facility])     Status: None   Collection Time: 03/30/16 10:50 AM  Result Value Ref Range   Troponin i,  poc 0.00 0.00 - 0.08 ng/mL   Comment 3            Comment: Due to the release kinetics of cTnI, a negative result within the first hours of the onset of symptoms does not rule out myocardial infarction with certainty. If myocardial infarction is still suspected, repeat the test at appropriate intervals.   I-Stat Chem 8, ED  (not at [facility], [facility])     Status: Abnormal   Collection Time: 03/30/16 10:51 AM  Result Value Ref Range   Sodium 139 135 - 145 mmol/L   Potassium 3.8 3.5 - 5.1 mmol/L   Chloride 101 101 - 111 mmol/L   BUN 17 6 - 20 mg/dL   Creatinine, Ser 0.50 0.44 - 1.00 mg/dL   Glucose, Bld 209 (H) 65 - 99 mg/dL   Calcium, Ion 1.10 (L) 1.13 - 1.30 mmol/L   TCO2 25 0 - 100 mmol/L   Hemoglobin 11.9 (L) 12.0 - 15.0 g/dL   HCT 35.0 (L) 36.0 - 46.0 %  Ethanol     Status: None   Collection Time: 03/30/16 10:55 AM  Result Value Ref Range   Alcohol, Ethyl (B) <5 <5 mg/dL    Comment:        LOWEST DETECTABLE LIMIT FOR SERUM ALCOHOL IS 5 mg/dL FOR MEDICAL PURPOSES ONLY   Protime-INR     Status: None   Collection Time: 03/30/16 10:55 AM  Result Value Ref Range   Prothrombin  Time 14.1 11.6 - 15.2 seconds   INR 1.07 0.00 - 1.49  APTT     Status: None   Collection Time: 03/30/16 10:55 AM  Result Value Ref Range   aPTT 26 24 - 37 seconds  CBC     Status: Abnormal   Collection Time: 03/30/16 10:55 AM  Result Value Ref Range   WBC 9.2 4.0 - 10.5 K/uL   RBC 4.26 3.87 - 5.11 MIL/uL   Hemoglobin 11.6 (L) 12.0 - 15.0 g/dL   HCT 35.3 (L) 36.0 - 46.0 %   MCV 82.9 78.0 - 100.0 fL   MCH 27.2 26.0 - 34.0 pg   MCHC 32.9 30.0 - 36.0 g/dL   RDW 12.6 11.5 - 15.5 %   Platelets 434 (H) 150 - 400 K/uL  Differential     Status: None   Collection Time: 03/30/16 10:55 AM  Result Value Ref Range   Neutrophils Relative % 71 %   Neutro Abs 6.5 1.7 - 7.7 K/uL   Lymphocytes Relative 19 %   Lymphs Abs 1.7 0.7 - 4.0 K/uL   Monocytes Relative 8 %   Monocytes Absolute 0.8 0.1 - 1.0 K/uL   Eosinophils Relative 2 %   Eosinophils Absolute 0.1 0.0 - 0.7 K/uL   Basophils Relative 0 %   Basophils Absolute 0.0 0.0 - 0.1 K/uL  Comprehensive metabolic panel     Status: Abnormal   Collection Time: 03/30/16 10:55 AM  Result Value Ref Range   Sodium 136 135 - 145 mmol/L   Potassium 3.8 3.5 - 5.1 mmol/L   Chloride 104 101 - 111 mmol/L   CO2 24 22 - 32 mmol/L   Glucose, Bld 208 (H) 65 - 99 mg/dL   BUN 15 6 - 20 mg/dL   Creatinine, Ser 0.59 0.44 - 1.00 mg/dL   Calcium 9.1 8.9 - 10.3 mg/dL   Total Protein 7.1 6.5 - 8.1 g/dL   Albumin 3.1 (L) 3.5 - 5.0 g/dL   AST 15 15 - 41  U/L   ALT 14 14 - 54 U/L   Alkaline Phosphatase 90 38 - 126 U/L   Total Bilirubin 0.6 0.3 - 1.2 mg/dL   GFR calc non Af Amer >60 >60 mL/min   GFR calc Af Amer >60 >60 mL/min    Comment: (NOTE) The eGFR has been calculated using the CKD EPI equation. This calculation has not been validated in all clinical situations. eGFR's persistently <60 mL/min signify possible Chronic Kidney Disease.    Anion gap 8 5 - 15  Hemoglobin A1c     Status: Abnormal   Collection Time: 03/30/16 10:55 AM  Result Value Ref Range    Hgb A1c MFr Bld 12.6 (H) 4.8 - 5.6 %    Comment: (NOTE)         Pre-diabetes: 5.7 - 6.4         Diabetes: >6.4         Glycemic control for adults with diabetes: <7.0    Mean Plasma Glucose 315 mg/dL    Comment: (NOTE) Performed At: [facility] [place], [place] [id number] Lindon Romp MD [id number]   Comprehensive metabolic panel     Status: Abnormal   Collection Time: 03/30/16  1:18 PM  Result Value Ref Range   Sodium 136 135 - 145 mmol/L   Potassium 3.9 3.5 - 5.1 mmol/L   Chloride 103 101 - 111 mmol/L   CO2 27 22 - 32 mmol/L   Glucose, Bld 197 (H) 65 - 99 mg/dL   BUN 13 6 - 20 mg/dL   Creatinine, Ser 0.52 0.44 - 1.00 mg/dL   Calcium 8.9 8.9 - 10.3 mg/dL   Total Protein 7.3 6.5 - 8.1 g/dL   Albumin 3.0 (L) 3.5 - 5.0 g/dL   AST 16 15 - 41 U/L   ALT 15 14 - 54 U/L   Alkaline Phosphatase 86 38 - 126 U/L   Total Bilirubin 0.5 0.3 - 1.2 mg/dL   GFR calc non Af Amer >60 >60 mL/min   GFR calc Af Amer >60 >60 mL/min    Comment: (NOTE) The eGFR has been calculated using the CKD EPI equation. This calculation has not been validated in all clinical situations. eGFR's persistently <60 mL/min signify possible Chronic Kidney Disease.    Anion gap 6 5 - 15  Lipid panel     Status: Abnormal   Collection Time: 03/30/16  1:18 PM  Result Value Ref Range   Cholesterol 175 0 - 200 mg/dL   Triglycerides 70 <150 mg/dL   HDL 38 (L) >40 mg/dL   Total CHOL/HDL Ratio 4.6 RATIO   VLDL 14 0 - 40 mg/dL   LDL Cholesterol 123 (H) 0 - 99 mg/dL    Comment:        Total Cholesterol/HDL:CHD Risk Coronary Heart Disease Risk Table                     Men   Women  1/2 Average Risk   3.4   3.3  Average Risk       5.0   4.4  2 X Average Risk   9.6   7.1  3 X Average Risk  23.4   11.0        Use the calculated Patient Ratio above and the CHD Risk Table to determine the patient's CHD Risk.        ATP III CLASSIFICATION (LDL):  <100     mg/dL   Optimal  100-129   mg/dL   Near or Above                    Optimal  130-159  mg/dL   Borderline  160-189  mg/dL   High  >190     mg/dL   Very High   MRSA PCR Screening     Status: None   Collection Time: 03/30/16  4:13 PM  Result Value Ref Range   MRSA by PCR NEGATIVE NEGATIVE    Comment:        The GeneXpert MRSA Assay (FDA approved for NASAL specimens only), is one component of a comprehensive MRSA colonization surveillance program. It is not intended to diagnose MRSA infection nor to guide or monitor treatment for MRSA infections.   Glucose, capillary     Status: Abnormal   Collection Time: 03/30/16  4:42 PM  Result Value Ref Range   Glucose-Capillary 152 (H) 65 - 99 mg/dL   Comment 1 Notify RN   Glucose, capillary     Status: Abnormal   Collection Time: 03/30/16  9:16 PM  Result Value Ref Range   Glucose-Capillary 115 (H) 65 - 99 mg/dL  Glucose, capillary     Status: Abnormal   Collection Time: 03/31/16  7:38 AM  Result Value Ref Range   Glucose-Capillary 186 (H) 65 - 99 mg/dL   Comment 1 Notify RN    Comment 2 Document in Chart   Glucose, capillary     Status: Abnormal   Collection Time: 03/31/16  1:25 PM  Result Value Ref Range   Glucose-Capillary 221 (H) 65 - 99 mg/dL  Glucose, capillary     Status: Abnormal   Collection Time: 03/31/16  4:59 PM  Result Value Ref Range   Glucose-Capillary 229 (H) 65 - 99 mg/dL  Glucose, capillary     Status: Abnormal   Collection Time: 03/31/16 11:00 PM  Result Value Ref Range   Glucose-Capillary 327 (H) 65 - 99 mg/dL   Comment 1 Notify RN    Comment 2 Document in Chart   Glucose, capillary     Status: Abnormal   Collection Time: 04/01/16  6:45 AM  Result Value Ref Range   Glucose-Capillary 266 (H) 65 - 99 mg/dL   Comment 1 Notify RN    Comment 2 Document in Chart   Echocardiogram     Status: None   Collection Time: 04/01/16  8:45 AM  Result Value Ref Range   Weight 2,336 oz   Height 62 in   BP 161/89 mmHg  VAS US CAROTID     Status:  None   Collection Time: 04/01/16  9:37 AM  Result Value Ref Range   Right CCA prox sys 67 cm/s   Right CCA prox dias 15 cm/s   Right cca dist sys -80 cm/s   Left CCA prox sys 87 cm/s   Left CCA prox dias 26 cm/s   Left CCA dist sys -85 cm/s   Left CCA dist dias -28 cm/s   Left ICA prox sys -80 cm/s   Left ICA prox dias -31 cm/s   Left ICA dist sys -95 cm/s   Left ICA dist dias -35 cm/s   RIGHT ECA DIAS -12.00 cm/s   RIGHT VERTEBRAL DIAS 16.00 cm/s   LEFT ECA DIAS -15.00 cm/s   LEFT VERTEBRAL DIAS -17.00 cm/s  Glucose, capillary     Status: Abnormal   Collection Time: 04/01/16 11:37 AM  Result Value Ref Range  Glucose-Capillary 240 (H) 65 - 99 mg/dL    Assessment/Plan: 1. Left-sided low back pain  RX Flexeril and Norco. Supportive measures discussed. Some nerve irritation suspected without full sciatica symptoms. Will begin Medrol pack as directed. Recommend PT or Sports Medicine assessment if symptoms are not resolving.  - cyclobenzaprine (FLEXERIL) 10 MG tablet; Take 1 tablet (10 mg total) by mouth 3 (three) times daily as needed for muscle spasms.  Dispense: 30 tablet; Refill: 0 - HYDROcodone-acetaminophen (NORCO) 10-325 MG tablet; Take 1 tablet by mouth every 8 (eight) hours as needed.  Dispense: 15 tablet; Refill: 0 - methylPREDNISolone (MEDROL DOSEPAK) 4 MG TBPK tablet; Take following package directions  Dispense: 21 tablet; Refill: 0  2. Left hip pain MSK. Pain with abduction of leg. Strength preserved. Supportive measures reviewed. Tx as directed for LBP.   - HYDROcodone-acetaminophen (NORCO) 10-325 MG tablet; Take 1 tablet by mouth every 8 (eight) hours as needed.  Dispense: 15 tablet; Refill: 0 - methylPREDNISolone (MEDROL DOSEPAK) 4 MG TBPK tablet; Take following package directions  Dispense: 21 tablet; Refill: 0   Leeanne Rio, [place]

## 2016-05-16 ENCOUNTER — Ambulatory Visit: Payer: BLUE CROSS/BLUE SHIELD | Admitting: Physician Assistant

## 2016-05-16 DIAGNOSIS — E11622 Type 2 diabetes mellitus with other skin ulcer: Secondary | ICD-10-CM | POA: Diagnosis not present

## 2016-05-17 ENCOUNTER — Emergency Department (HOSPITAL_BASED_OUTPATIENT_CLINIC_OR_DEPARTMENT_OTHER): Payer: BLUE CROSS/BLUE SHIELD

## 2016-05-17 ENCOUNTER — Emergency Department (HOSPITAL_BASED_OUTPATIENT_CLINIC_OR_DEPARTMENT_OTHER)
Admission: EM | Admit: 2016-05-17 | Discharge: 2016-05-17 | Disposition: A | Discharge status: Home / Self Care | Payer: BLUE CROSS/BLUE SHIELD | Attending: Emergency Medicine | Admitting: Emergency Medicine

## 2016-05-17 ENCOUNTER — Encounter (HOSPITAL_BASED_OUTPATIENT_CLINIC_OR_DEPARTMENT_OTHER): Payer: Self-pay | Admitting: *Deleted

## 2016-05-17 DIAGNOSIS — E119 Type 2 diabetes mellitus without complications: Secondary | ICD-10-CM | POA: Insufficient documentation

## 2016-05-17 DIAGNOSIS — Z7982 Long term (current) use of aspirin: Secondary | ICD-10-CM | POA: Diagnosis not present

## 2016-05-17 DIAGNOSIS — Z794 Long term (current) use of insulin: Secondary | ICD-10-CM | POA: Insufficient documentation

## 2016-05-17 DIAGNOSIS — M25552 Pain in left hip: Secondary | ICD-10-CM | POA: Diagnosis present

## 2016-05-17 DIAGNOSIS — Z87891 Personal history of nicotine dependence: Secondary | ICD-10-CM | POA: Insufficient documentation

## 2016-05-17 DIAGNOSIS — I10 Essential (primary) hypertension: Secondary | ICD-10-CM | POA: Insufficient documentation

## 2016-05-17 DIAGNOSIS — Z79899 Other long term (current) drug therapy: Secondary | ICD-10-CM | POA: Insufficient documentation

## 2016-05-17 DIAGNOSIS — Z7984 Long term (current) use of oral hypoglycemic drugs: Secondary | ICD-10-CM | POA: Insufficient documentation

## 2016-05-17 MED ORDER — METHOCARBAMOL 500 MG PO TABS: 500.0000 mg | ORAL_TABLET | Freq: Two times a day (BID) | ORAL | 0 refills | Status: DC

## 2016-05-17 MED ORDER — OXYCODONE-ACETAMINOPHEN 5-325 MG PO TABS: 2.0000 | ORAL_TABLET | ORAL | 0 refills | Status: DC | PRN

## 2016-05-17 NOTE — ED Triage Notes (Addendum)
Patient c/o left hip pain for the past two weeks. She saw her primary MD on Tuesday and was given a muscle relaxer and a steroid pack. Patient states that the medications are not helping. Hurts when more ambulating. Patient took a muscle relaxer last night, but has not taken a hydrocodone.

## 2016-05-17 NOTE — ED Notes (Signed)
Patient returned from X-ray 

## 2016-05-17 NOTE — ED Provider Notes (Signed)
[place] DEPT [facility] Provider Note   CSN: [id number] Arrival date & time: 05/17/16  1129     History   Chief Complaint Chief Complaint  Patient presents with  . Hip Pain    HPI [name] is a 50 y.o. female.  The history is provided by the patient. No language interpreter was used.  Hip Pain  This is a new problem. The current episode started more than 1 week ago. The problem occurs constantly. The problem has been gradually worsening. Nothing aggravates the symptoms. Nothing relieves the symptoms. She has tried nothing for the symptoms. The treatment provided no relief.  Pt complains of having pain in her left hip.  Pt reports she saw her primary and was placed on a muscle relaxer.  Pt reports no relief Pt has diabetes and htn.  Pt reports she has not had any injury. Past Medical History:  Diagnosis Date  . Acute bronchitis 07/08/2011  . COMMON MIGRAINE 10/07/2010  . CVA (cerebral infarction) 04/19/2013  . Depression 12/18/2012  . Diabetes mellitus type II, uncontrolled ([place]) 10/07/2010   Qualifier: Diagnosis of  By: Charlett Blake MD, [name]    . Disturbance of skin sensation 10/07/2010  . DM 10/07/2010  . Essential hypertension, benign 10/07/2010  . Heart murmur   . HEART MURMUR, HX OF 10/07/2010  . Hyperlipidemia 12/06/2010  . Hypertension   . NEPHROLITHIASIS, HX OF 10/07/2010  . Overweight(278.02) 12/06/2010  . PERIPHERAL NEUROPATHY, FEET 10/07/2010  . PVD (peripheral vascular disease) ([place]) 01/21/2012  . RESTLESS LEG SYNDROME 10/25/2010  . SOB (shortness of breath) 04/01/2012  . Stroke [facility]) 2014, 2017  . Weakness of right side of body 04/19/2013    Patient Active Problem List   Diagnosis Date Noted  . Diabetic polyneuropathy associated with diabetes mellitus due to underlying condition ([place]) 05/07/2016  . Orthostatic hypotension 05/07/2016  . Cerebrovascular disease 04/27/2016  . Nausea with vomiting 04/01/2016  . ICH (intracerebral hemorrhage) ([place]) 03/30/2016  .  Nonhealing nonsurgical wound 03/06/2016  . Visit for preventive health examination 05/21/2015  . Breast cancer screening 05/21/2015  . Arm lesion 07/02/2014  . Pain in joint, ankle and foot 04/28/2014  . Pain in limb 01/13/2014  . Neuropathic pain of both legs 01/13/2014  . Hip pain 01/12/2014  . Allergic rhinitis 03/10/2013  . Back pain 03/08/2013  . Depression 12/18/2012  . PVD (peripheral vascular disease) ([place]) 01/21/2012  . Diabetic ulcer of lower extremity ([place]) 12/22/2011  . Hyperlipidemia 12/06/2010  . Overweight(278.02) 12/06/2010  . RESTLESS LEG SYNDROME 10/25/2010  . Diabetes mellitus type II, uncontrolled ([place]) 10/07/2010  . Migraine without aura 10/07/2010  . Hereditary and idiopathic peripheral neuropathy 10/07/2010  . Essential hypertension, benign 10/07/2010  . DISTURBANCE OF SKIN SENSATION 10/07/2010  . HEART MURMUR, HX OF 10/07/2010  . NEPHROLITHIASIS, HX OF 10/07/2010    Past Surgical History:  Procedure Laterality Date  . None      OB History    No data available       Home Medications    Prior to Admission medications   Medication Sig Start Date End Date Taking? Authorizing Provider  aspirin EC 81 MG tablet Take 81 mg by mouth daily.    Historical Provider, MD  cyclobenzaprine (FLEXERIL) 10 MG tablet Take 1 tablet (10 mg total) by mouth 3 (three) times daily as needed for muscle spasms. 05/14/16   Brunetta Jeans, PA-C  glucose blood (ONE TOUCH ULTRA TEST) test strip Use once daily to check blood sugar.  DX E11.9 01/04/16   Mosie Lukes, MD  HYDROcodone-acetaminophen (NORCO) 10-325 MG tablet Take 1 tablet by mouth every 8 (eight) hours as needed. 05/14/16   Brunetta Jeans, PA-C  Insulin Glargine (LANTUS SOLOSTAR) 100 UNIT/ML Solostar Pen Inject 50 Units into the skin 2 (two) times daily. 10/09/15   Mosie Lukes, MD  Insulin Pen Needle 32G X 4 MM MISC Use as directed for insulin.DX E11.65 03/03/16   Mosie Lukes, MD  Lancets (FREESTYLE) lancets DX-  250.02  Pts machine is Freestyle Freedom Lite 04/20/13   Mosie Lukes, MD  LANTUS SOLOSTAR 100 UNIT/ML Solostar Pen INJECT 34 UNITS IN AM & 32 UNITS IN PM. INCREASE BY 2 UNITS EVERY 3 DAYS AS LONG AS ABOVE 100 05/12/16   Mosie Lukes, MD  lisinopril (PRINIVIL,ZESTRIL) 10 MG tablet TAKE 1 TABLET (10 MG TOTAL) BY MOUTH 2 (TWO) TIMES DAILY. 04/01/16   Mosie Lukes, MD  metFORMIN (GLUCOPHAGE) 1000 MG tablet TAKE 1 TABLET TWICE DAILY AND 1/2 TABLET AT NOON 04/01/16   Mosie Lukes, MD  methylPREDNISolone (MEDROL DOSEPAK) 4 MG TBPK tablet Take following package directions 05/14/16   Brunetta Jeans, PA-C  metoCLOPramide (REGLAN) 10 MG tablet Take 1 tablet (10 mg total) by mouth every 8 (eight) hours as needed for nausea. 03/28/16   Malvin Johns, MD    Family History Family History  Problem Relation Age of Onset  . Arthritis Mother   . Stroke Brother     previous smoker  . Alcohol abuse Brother     in remission  . Leukemia Brother   . Diabetes Paternal Grandmother   . Healthy Son     Social History Social History  Substance Use Topics  . Smoking status: Former Smoker    Packs/day: 0.50    Quit date: 09/08/2005  . Smokeless tobacco: Never Used  . Alcohol use No     Allergies   Lyrica [pregabalin]; Tramadol; and Morphine and related   Review of Systems Review of Systems  All other systems reviewed and are negative.    Physical Exam Updated Vital Signs BP 159/85 (BP Location: Right Arm)   Pulse 82   Temp 98.7 F (37.1 C) (Oral)   Resp 18   Ht 5\' 2"  (1.575 m)   Wt 65.8 kg   LMP 02/12/2011   SpO2 100%   BMI 26.52 kg/m   Physical Exam  Constitutional: She appears well-developed and well-nourished. No distress.  HENT:  Head: Normocephalic and atraumatic.  Eyes: Conjunctivae are normal.  Neck: Neck supple.  Cardiovascular: Normal rate and regular rhythm.   No murmur heard. Pulmonary/Chest: Effort normal. No respiratory distress.  Abdominal: Soft. There is no  tenderness.  Musculoskeletal: She exhibits tenderness. She exhibits no edema.  Tender left hip,  Pain with standing,  No pain with moving on stretcher,  nv and ns intact  Neurological: She is alert.  Skin: Skin is warm and dry.  Psychiatric: She has a normal mood and affect.  Nursing note and vitals reviewed.    ED Treatments / Results  Labs (all labs ordered are listed, but only abnormal results are displayed) Labs Reviewed - No data to display  EKG  EKG Interpretation None       Radiology Dg Hip Unilat W Or Wo Pelvis 2-3 Views Left  Result Date: 05/17/2016 CLINICAL DATA:  Left hip pain for 2 weeks, EXAM: DG HIP (WITH OR WITHOUT PELVIS) 2-3V LEFT COMPARISON:  None. FINDINGS: Three  views of the left hip submitted. No acute fracture or subluxation. No radiopaque foreign body. Bilateral hip joints are symmetrical in appearance. Minimal degenerative changes pubic symphysis. IMPRESSION: Negative. Electronically Signed   By: Lahoma Crocker M.D.   On: 05/17/2016 13:07    Procedures Procedures (including critical care time)  Medications Ordered in ED Medications - No data to display   Initial Impression / Assessment and Plan / ED Course  I have reviewed the triage vital signs and the nursing notes.  Pertinent labs & imaging results that were available during my care of the patient were reviewed by me and considered in my medical decision making (see chart for details).  Clinical Course    Xray normal.  I will avoid cortisone.  Pt has had a recent cva  I will also avoid insids.  Pt given rx for Percocet  Pt advised to see Dr. Erlinda Hong for evaluation this week.  Final Clinical Impressions(s) / ED Diagnoses   Final diagnoses:  Left hip pain    New Prescriptions New Prescriptions   No medications on file     Fransico Meadow, PA-C 05/17/16 1341    Isla Pence, MD 05/17/16 [phone]

## 2016-05-18 ENCOUNTER — Encounter (HOSPITAL_COMMUNITY): Payer: Self-pay | Admitting: Emergency Medicine

## 2016-05-18 ENCOUNTER — Emergency Department (HOSPITAL_COMMUNITY): Payer: BLUE CROSS/BLUE SHIELD

## 2016-05-18 ENCOUNTER — Emergency Department (HOSPITAL_COMMUNITY)
Admission: EM | Admit: 2016-05-18 | Discharge: 2016-05-19 | Disposition: A | Discharge status: Home / Self Care | Payer: BLUE CROSS/BLUE SHIELD | Attending: Emergency Medicine | Admitting: Emergency Medicine

## 2016-05-18 DIAGNOSIS — Z87891 Personal history of nicotine dependence: Secondary | ICD-10-CM | POA: Diagnosis not present

## 2016-05-18 DIAGNOSIS — Z794 Long term (current) use of insulin: Secondary | ICD-10-CM | POA: Insufficient documentation

## 2016-05-18 DIAGNOSIS — Z79899 Other long term (current) drug therapy: Secondary | ICD-10-CM | POA: Insufficient documentation

## 2016-05-18 DIAGNOSIS — Z7984 Long term (current) use of oral hypoglycemic drugs: Secondary | ICD-10-CM | POA: Diagnosis not present

## 2016-05-18 DIAGNOSIS — Z7982 Long term (current) use of aspirin: Secondary | ICD-10-CM | POA: Diagnosis not present

## 2016-05-18 DIAGNOSIS — R1032 Left lower quadrant pain: Secondary | ICD-10-CM | POA: Diagnosis not present

## 2016-05-18 DIAGNOSIS — M549 Dorsalgia, unspecified: Secondary | ICD-10-CM | POA: Diagnosis present

## 2016-05-18 DIAGNOSIS — I10 Essential (primary) hypertension: Secondary | ICD-10-CM | POA: Insufficient documentation

## 2016-05-18 DIAGNOSIS — R112 Nausea with vomiting, unspecified: Secondary | ICD-10-CM | POA: Diagnosis not present

## 2016-05-18 DIAGNOSIS — E119 Type 2 diabetes mellitus without complications: Secondary | ICD-10-CM | POA: Insufficient documentation

## 2016-05-18 LAB — COMPREHENSIVE METABOLIC PANEL
ALT: 21 U/L (ref 14–54)
AST: 22 U/L (ref 15–41)
Albumin: 4.1 g/dL (ref 3.5–5.0)
Alkaline Phosphatase: 81 U/L (ref 38–126)
Anion gap: 8 (ref 5–15)
BUN: 19 mg/dL (ref 6–20)
CO2: 29 mmol/L (ref 22–32)
Calcium: 9.3 mg/dL (ref 8.9–10.3)
Chloride: 99 mmol/L — ABNORMAL LOW (ref 101–111)
Creatinine, Ser: 0.63 mg/dL (ref 0.44–1.00)
GFR calc Af Amer: >60 mL/min (ref >60)
GFR calc non Af Amer: >60 mL/min (ref >60)
Glucose, Bld: 336 mg/dL — ABNORMAL HIGH (ref 65–99)
Potassium: 4.9 mmol/L (ref 3.5–5.1)
Sodium: 136 mmol/L (ref 135–145)
Total Bilirubin: 0.9 mg/dL (ref 0.3–1.2)
Total Protein: 8.2 g/dL — ABNORMAL HIGH (ref 6.5–8.1)

## 2016-05-18 LAB — CBC WITH DIFFERENTIAL/PLATELET
Basophils Absolute: 0.1 10*3/uL (ref 0.0–0.1)
Basophils Relative: 0 %
Eosinophils Absolute: 0 10*3/uL (ref 0.0–0.7)
Eosinophils Relative: 0 %
HCT: 38.6 % (ref 36.0–46.0)
Hemoglobin: 13.3 g/dL (ref 12.0–15.0)
Lymphocytes Relative: 14 %
Lymphs Abs: 1.6 10*3/uL (ref 0.7–4.0)
MCH: 28.6 pg (ref 26.0–34.0)
MCHC: 34.5 g/dL (ref 30.0–36.0)
MCV: 83 fL (ref 78.0–100.0)
Monocytes Absolute: 0.9 10*3/uL (ref 0.1–1.0)
Monocytes Relative: 8 %
Neutro Abs: 8.9 10*3/uL — ABNORMAL HIGH (ref 1.7–7.7)
Neutrophils Relative %: 78 %
Platelets: 395 10*3/uL (ref 150–400)
RBC: 4.65 MIL/uL (ref 3.87–5.11)
RDW: 14.1 % (ref 11.5–15.5)
WBC: 11.5 10*3/uL — ABNORMAL HIGH (ref 4.0–10.5)

## 2016-05-18 MED ORDER — MORPHINE SULFATE (PF) 4 MG/ML IV SOLN
4.0000 mg | Freq: Once | INTRAVENOUS | Status: AC
  Administered 2016-05-18: 4 mg via INTRAVENOUS
  Filled 2016-05-18: qty 1

## 2016-05-18 MED ORDER — ONDANSETRON HCL 4 MG/2ML IJ SOLN
4.0000 mg | Freq: Once | INTRAMUSCULAR | Status: AC
  Administered 2016-05-18: 4 mg via INTRAVENOUS
  Filled 2016-05-18: qty 2

## 2016-05-18 NOTE — ED Notes (Signed)
Pt. Unable to give urine at this time. 

## 2016-05-18 NOTE — ED Provider Notes (Signed)
[place] DEPT Provider Note   CSN: [id number] Arrival date & time: 05/18/16  1954  By signing my name below, I, Emmanuella Mensah, attest that this documentation has been prepared under the direction and in the presence of [employer], PA-C. Electronically Signed: Judithann Sauger, ED Scribe. 05/18/16. 10:04 PM.    History   Chief Complaint Chief Complaint  Patient presents with  . Spasms  . Groin Pain  . Hypertension   HPI Comments: [name] is a 50 y.o. female with a hx of DM II, hypertension, peripheral neuropathy and stroke brought in by ambulance, who presents to the Emergency Department complaining of gradually worsening constant moderate left back pain that radiates down her left groin onset one week ago. She reports associated difficulty ambulating secondary to pain, nausea, and vomiting. She adds that she began to vomit after taking the Fentanyl by EMS en route although she was given Zofran as well. She states that occasionally, certain positions help the pain. No alleviating factors noted. Pt has not tried any medications prior to EMS arrival. She denies any recent falls, injuries, or trauma. Pt was seen yesterday for these symptoms and advised to follow up with Ortho after a normal x-ray however, pt has not followed up with Ortho. She denies any fever, bowel/bladder incontinence, acute numbness/tingling in her BLE, dysuria, hematuria, vaginal bleeding, abdominal pain, or any generalized rash.   Pt also presents with a bandaged area around her brown recluse spider bite to her right lower leg that occurred 8 months ago.   The history is provided by the patient. No language interpreter was used.    Past Medical History:  Diagnosis Date  . Acute bronchitis 07/08/2011  . COMMON MIGRAINE 10/07/2010  . CVA (cerebral infarction) 04/19/2013  . Depression 12/18/2012  . Diabetes mellitus type II, uncontrolled ([place]) 10/07/2010   Qualifier: Diagnosis of  By: Charlett Blake MD,  [name]    . Disturbance of skin sensation 10/07/2010  . DM 10/07/2010  . Essential hypertension, benign 10/07/2010  . Heart murmur   . HEART MURMUR, HX OF 10/07/2010  . Hyperlipidemia 12/06/2010  . Hypertension   . NEPHROLITHIASIS, HX OF 10/07/2010  . Overweight(278.02) 12/06/2010  . PERIPHERAL NEUROPATHY, FEET 10/07/2010  . PVD (peripheral vascular disease) ([place]) 01/21/2012  . RESTLESS LEG SYNDROME 10/25/2010  . SOB (shortness of breath) 04/01/2012  . Stroke [facility]) 2014, 2017  . Weakness of right side of body 04/19/2013    Patient Active Problem List   Diagnosis Date Noted  . Diabetic polyneuropathy associated with diabetes mellitus due to underlying condition ([place]) 05/07/2016  . Orthostatic hypotension 05/07/2016  . Cerebrovascular disease 04/27/2016  . Nausea with vomiting 04/01/2016  . ICH (intracerebral hemorrhage) ([place]) 03/30/2016  . Nonhealing nonsurgical wound 03/06/2016  . Visit for preventive health examination 05/21/2015  . Breast cancer screening 05/21/2015  . Arm lesion 07/02/2014  . Pain in joint, ankle and foot 04/28/2014  . Pain in limb 01/13/2014  . Neuropathic pain of both legs 01/13/2014  . Hip pain 01/12/2014  . Allergic rhinitis 03/10/2013  . Back pain 03/08/2013  . Depression 12/18/2012  . PVD (peripheral vascular disease) ([place]) 01/21/2012  . Diabetic ulcer of lower extremity ([place]) 12/22/2011  . Hyperlipidemia 12/06/2010  . Overweight(278.02) 12/06/2010  . RESTLESS LEG SYNDROME 10/25/2010  . Diabetes mellitus type II, uncontrolled ([place]) 10/07/2010  . Migraine without aura 10/07/2010  . Hereditary and idiopathic peripheral neuropathy 10/07/2010  . Essential hypertension, benign 10/07/2010  . DISTURBANCE OF SKIN SENSATION 10/07/2010  .  HEART MURMUR, HX OF 10/07/2010  . NEPHROLITHIASIS, HX OF 10/07/2010    Past Surgical History:  Procedure Laterality Date  . None      OB History    No data available       Home Medications    Prior to Admission  medications   Medication Sig Start Date End Date Taking? Authorizing Provider  aspirin EC 81 MG tablet Take 81 mg by mouth daily.    Historical Provider, MD  cyclobenzaprine (FLEXERIL) 10 MG tablet Take 1 tablet (10 mg total) by mouth 3 (three) times daily as needed for muscle spasms. 05/14/16   Brunetta Jeans, PA-C  glucose blood (ONE TOUCH ULTRA TEST) test strip Use once daily to check blood sugar.  DX E11.9 01/04/16   Mosie Lukes, MD  HYDROcodone-acetaminophen (NORCO) 10-325 MG tablet Take 1 tablet by mouth every 8 (eight) hours as needed. 05/14/16   Brunetta Jeans, PA-C  Insulin Glargine (LANTUS SOLOSTAR) 100 UNIT/ML Solostar Pen Inject 50 Units into the skin 2 (two) times daily. 10/09/15   Mosie Lukes, MD  Insulin Pen Needle 32G X 4 MM MISC Use as directed for insulin.DX E11.65 03/03/16   Mosie Lukes, MD  Lancets (FREESTYLE) lancets DX- 250.02  Pts machine is Freestyle Freedom Lite 04/20/13   Mosie Lukes, MD  LANTUS SOLOSTAR 100 UNIT/ML Solostar Pen INJECT 34 UNITS IN AM & 32 UNITS IN PM. INCREASE BY 2 UNITS EVERY 3 DAYS AS LONG AS ABOVE 100 05/12/16   Mosie Lukes, MD  lisinopril (PRINIVIL,ZESTRIL) 10 MG tablet TAKE 1 TABLET (10 MG TOTAL) BY MOUTH 2 (TWO) TIMES DAILY. 04/01/16   Mosie Lukes, MD  metFORMIN (GLUCOPHAGE) 1000 MG tablet TAKE 1 TABLET TWICE DAILY AND 1/2 TABLET AT NOON 04/01/16   Mosie Lukes, MD  methocarbamol (ROBAXIN) 500 MG tablet Take 1 tablet (500 mg total) by mouth 2 (two) times daily. 05/17/16   Fransico Meadow, PA-C  methylPREDNISolone (MEDROL DOSEPAK) 4 MG TBPK tablet Take following package directions 05/14/16   Brunetta Jeans, PA-C  metoCLOPramide (REGLAN) 10 MG tablet Take 1 tablet (10 mg total) by mouth every 8 (eight) hours as needed for nausea. 03/28/16   Malvin Johns, MD  oxyCODONE-acetaminophen (PERCOCET/ROXICET) 5-325 MG tablet Take 2 tablets by mouth every 4 (four) hours as needed for severe pain. 05/17/16   Fransico Meadow, PA-C    Family History Family  History  Problem Relation Age of Onset  . Arthritis Mother   . Stroke Brother     previous smoker  . Alcohol abuse Brother     in remission  . Leukemia Brother   . Diabetes Paternal Grandmother   . Healthy Son     Social History Social History  Substance Use Topics  . Smoking status: Former Smoker    Packs/day: 0.50    Quit date: 09/08/2005  . Smokeless tobacco: Never Used  . Alcohol use No     Allergies   Lyrica [pregabalin]; Tramadol; and Morphine and related   Review of Systems Review of Systems  Constitutional: Negative for fever.  Gastrointestinal: Positive for nausea and vomiting. Negative for abdominal pain.  Genitourinary: Negative for dysuria, hematuria and vaginal bleeding.  Musculoskeletal: Positive for arthralgias, back pain and gait problem.  Skin: Negative for rash and wound.  Neurological: Negative for numbness.  All other systems reviewed and are negative.    Physical Exam Updated Vital Signs BP (!) 211/112 (BP Location: Left Arm)  Pulse 94   Resp 18   Ht 5\' 2"  (1.575 m)   Wt 150 lb (68 kg)   LMP 02/12/2011 (Exact Date)   BMI 27.44 kg/m   Physical Exam  Constitutional: She is oriented to person, place, and time. She appears well-developed and well-nourished. No distress.  HENT:  Head: Normocephalic and atraumatic.  Eyes: Conjunctivae are normal. Right eye exhibits no discharge. Left eye exhibits no discharge. No scleral icterus.  Cardiovascular: Normal rate.   Pulmonary/Chest: Effort normal.  Musculoskeletal: She exhibits tenderness. She exhibits no deformity.       Left hip: She exhibits decreased range of motion ( limited by pain), tenderness and bony tenderness. She exhibits normal strength, no swelling, no crepitus, no deformity and no laceration.       Legs: No hernia palpated. No obvious deformity.  Neurological: She is alert and oriented to person, place, and time. Coordination normal.  Skin: Skin is warm and dry. No rash noted. She  is not diaphoretic. No erythema. No pallor.  Psychiatric: She has a normal mood and affect. Her behavior is normal.  Nursing note and vitals reviewed.    ED Treatments / Results  DIAGNOSTIC STUDIES: Oxygen Saturation is 100% on RA, normal by my interpretation.    COORDINATION OF CARE: 9:59 PM- Pt advised of plan for treatment and pt agrees.  Labs (all labs ordered are listed, but only abnormal results are displayed) Labs Reviewed  CBC WITH DIFFERENTIAL/PLATELET - Abnormal; Notable for the following:       Result Value   WBC 11.5 (*)    Neutro Abs 8.9 (*)    All other components within normal limits  COMPREHENSIVE METABOLIC PANEL - Abnormal; Notable for the following:    Chloride 99 (*)    Glucose, Bld 336 (*)    Total Protein 8.2 (*)    All other components within normal limits  URINALYSIS, ROUTINE W REFLEX MICROSCOPIC (NOT AT [facility]) - Abnormal; Notable for the following:    APPearance CLOUDY (*)    Glucose, UA >1000 (*)    Hgb urine dipstick TRACE (*)    Protein, ur 30 (*)    All other components within normal limits  URINE MICROSCOPIC-ADD ON - Abnormal; Notable for the following:    Squamous Epithelial / LPF 0-5 (*)    Bacteria, UA RARE (*)    All other components within normal limits  PREGNANCY, URINE    EKG  EKG Interpretation None       Radiology No results found.  Procedures Procedures (including critical care time)  Medications Ordered in ED Medications - No data to display   Initial Impression / Assessment and Plan / ED Course  Donnald Garre, PA-C has reviewed the triage vital signs and the nursing notes.  Pertinent labs & imaging results that were available during my care of the patient were reviewed by me and considered in my medical decision making (see chart for details).  Clinical Course     50 y.o F with a pmhx of DM, HTN, CVA presents to the ED today c/o worsening left hip and groin pain onset 1 week ago. Pt has been seen in the ED twice  and by her PCP once for same pain. Pt has had negative hip xray and has been instructed to follow up with orthopedics which she has not done. Pt has been given Rx for medrol pack, flexeril Norco and percocet by previous providers in the past week which she has taken without  relief of her symptoms. Pt states that her pain is unchanged today but she wants to know what is wrong with her. On presentation to ED, pt is crying out in pain. Pt was given 221mcg of fentanyl by EMS. Pt states that she is unable to walk due to pain. ON exam no obvious deformity. Pain in left groin and over left hip. No overlying erythema or warmth to suggest infection or septic hip. No leukocytosis. UA unremarkable. Glucoseuria presents, this is baseline for pt. Due to severity of pain CT hip obtained which was negative. Pain managed in ED. After given results of CT pt now ambulating in exam room without difficulty. Recommend follow up with ortho. Referral given. Pt has home rx for pain medication that she may take as needed. Return precautions outlined in patient discharge instructions.    Final Clinical Impressions(s) / ED Diagnoses   Final diagnoses:  Groin pain, left    New Prescriptions New Prescriptions   No medications on file   I personally performed the services described in this documentation, which was scribed in my presence. The recorded information has been reviewed and is accurate.      Dondra Spry [place], PA-C 05/21/16 1747    Leo Grosser, MD 05/22/16 0900

## 2016-05-18 NOTE — ED Notes (Signed)
Pt reminded of need for urine 

## 2016-05-18 NOTE — ED Triage Notes (Signed)
Patient is hip and back spasms for two weeks. Patient is nauseated and has been vomiting. Patient receive 200 mcg of Fentanyl and mg of Zofran by EMS through IV. Patient pulled IV out accidentally per EMS.

## 2016-05-19 ENCOUNTER — Telehealth: Payer: Self-pay | Admitting: Family Medicine

## 2016-05-19 LAB — URINE MICROSCOPIC-ADD ON

## 2016-05-19 LAB — URINALYSIS, ROUTINE W REFLEX MICROSCOPIC
Bilirubin Urine: NEGATIVE
Glucose, UA: >1000 mg/dL — AB
Ketones, ur: NEGATIVE mg/dL
Leukocytes, UA: NEGATIVE
Nitrite: NEGATIVE
Protein, ur: 30 mg/dL — AB
Specific Gravity, Urine: 1.026 (ref 1.005–1.030)
pH: 6.5 (ref 5.0–8.0)

## 2016-05-19 LAB — PREGNANCY, URINE: Preg Test, Ur: NEGATIVE

## 2016-05-19 MED ORDER — SIMVASTATIN 5 MG PO TABS: 5.0000 mg | ORAL_TABLET | Freq: Every day | ORAL | 1 refills | Status: DC

## 2016-05-19 MED ORDER — ONDANSETRON HCL 4 MG/2ML IJ SOLN
4.0000 mg | Freq: Once | INTRAMUSCULAR | Status: AC
  Administered 2016-05-19: 4 mg via INTRAVENOUS
  Filled 2016-05-19: qty 2

## 2016-05-19 MED ORDER — MORPHINE SULFATE (PF) 2 MG/ML IV SOLN
2.0000 mg | Freq: Once | INTRAVENOUS | Status: AC
  Administered 2016-05-19: 2 mg via INTRAVENOUS
  Filled 2016-05-19: qty 1

## 2016-05-19 MED ORDER — MORPHINE SULFATE (PF) 4 MG/ML IV SOLN
4.0000 mg | Freq: Once | INTRAVENOUS | Status: AC
  Administered 2016-05-19: 4 mg via INTRAVENOUS
  Filled 2016-05-19: qty 1

## 2016-05-19 NOTE — ED Notes (Addendum)
Pt.made aware that she cannot sit on side of bed due to the risk of falling off bed. Pt. Has fall/risk band on left arm. Pt. Family stated that "patient will not fall out of the bed." RN,Jake made aware.

## 2016-05-19 NOTE — ED Notes (Signed)
Pt refusing catheterization. States "Can I have a cup of water, I would like to try after some water."

## 2016-05-19 NOTE — Telephone Encounter (Signed)
Fax from pharmacy recommending patient be on simvastatin 5 mg that due to her diabetes would be beneficial.  PCP agreed to contact the patient and if ok to send in simvastatin 5 mg to local pharmacy. The patient agreed to try a 30 day supply.

## 2016-05-19 NOTE — Discharge Instructions (Signed)
Take home pani medication and muscle relaxers as needed. Apply ice to affected area. Increase mobility of groin, stretch and massage muscle. Follow up with orthopedic provider for re-evaluation. Return to the ED if you experience severe worsening of your symptoms, increased redness or swelling, fevers, chills, chest pain or SOB.

## 2016-05-19 NOTE — ED Notes (Signed)
Pt informed that we would need to catheterize her if she was unable to urinate for a sample. Pt states that she will try one more time. Will reassess.

## 2016-05-20 ENCOUNTER — Ambulatory Visit: Payer: BLUE CROSS/BLUE SHIELD | Admitting: Physician Assistant

## 2016-05-22 ENCOUNTER — Other Ambulatory Visit: Payer: Self-pay | Admitting: Orthopedic Surgery

## 2016-05-22 DIAGNOSIS — M545 Low back pain: Secondary | ICD-10-CM

## 2016-05-22 DIAGNOSIS — M25552 Pain in left hip: Secondary | ICD-10-CM

## 2016-05-23 DIAGNOSIS — E11622 Type 2 diabetes mellitus with other skin ulcer: Secondary | ICD-10-CM | POA: Diagnosis not present

## 2016-05-26 ENCOUNTER — Ambulatory Visit
Admission: RE | Admit: 2016-05-26 | Discharge: 2016-05-26 | Disposition: A | Discharge status: Home / Self Care | Payer: BLUE CROSS/BLUE SHIELD | Source: Ambulatory Visit | Attending: Orthopedic Surgery | Admitting: Orthopedic Surgery

## 2016-05-26 DIAGNOSIS — M545 Low back pain: Secondary | ICD-10-CM

## 2016-05-26 DIAGNOSIS — M25552 Pain in left hip: Secondary | ICD-10-CM

## 2016-05-29 DIAGNOSIS — E11622 Type 2 diabetes mellitus with other skin ulcer: Secondary | ICD-10-CM | POA: Diagnosis not present

## 2016-05-30 ENCOUNTER — Telehealth: Payer: Self-pay | Admitting: Family Medicine

## 2016-05-30 NOTE — Telephone Encounter (Signed)
Called left msg. To call back 

## 2016-05-30 NOTE — Telephone Encounter (Signed)
Called the patient left msg. To call back 

## 2016-05-30 NOTE — Telephone Encounter (Signed)
Patient was seen by an ortho regarding her spine.  He suggested for a couple months she take OTC pain reliever such as ibuprofen until her nerves calm down.  She told him she recently had a stroke, he then told her to get advise with PCP if ok to take ibuprofen/aleve/tylenol?? Is it safe?

## 2016-05-30 NOTE — Telephone Encounter (Signed)
I would not have her take an NSAID without clearing it through neurology due to hemorrhagic stroke but she could take Tylenol 500 mg 2 tabs po bid for pain. This will not affect bleeding time

## 2016-05-30 NOTE — Telephone Encounter (Signed)
Pt says that she had a stroke and was advise by a provider to take an anti inflammatory medication. She would like to be advised by PCP .    Please call pt back to assist further.    Thanks.

## 2016-05-30 NOTE — Telephone Encounter (Signed)
Patient informed of PCP instructions regarding OTC medication.

## 2016-05-30 NOTE — Telephone Encounter (Signed)
Please check with patient, usually we advise to take an anticoagulant not an antiinflammatory so need to know name of med so I can answer correctly

## 2016-06-06 DIAGNOSIS — E11622 Type 2 diabetes mellitus with other skin ulcer: Secondary | ICD-10-CM | POA: Diagnosis not present

## 2016-06-13 ENCOUNTER — Encounter: Payer: Self-pay | Admitting: Physician Assistant

## 2016-06-13 ENCOUNTER — Encounter (HOSPITAL_BASED_OUTPATIENT_CLINIC_OR_DEPARTMENT_OTHER): Payer: BLUE CROSS/BLUE SHIELD | Attending: Internal Medicine

## 2016-06-13 DIAGNOSIS — E1140 Type 2 diabetes mellitus with diabetic neuropathy, unspecified: Secondary | ICD-10-CM | POA: Insufficient documentation

## 2016-06-13 DIAGNOSIS — E1136 Type 2 diabetes mellitus with diabetic cataract: Secondary | ICD-10-CM | POA: Insufficient documentation

## 2016-06-13 DIAGNOSIS — E11622 Type 2 diabetes mellitus with other skin ulcer: Secondary | ICD-10-CM | POA: Diagnosis present

## 2016-06-13 DIAGNOSIS — I872 Venous insufficiency (chronic) (peripheral): Secondary | ICD-10-CM | POA: Insufficient documentation

## 2016-06-13 DIAGNOSIS — I10 Essential (primary) hypertension: Secondary | ICD-10-CM | POA: Insufficient documentation

## 2016-06-13 DIAGNOSIS — Z8631 Personal history of diabetic foot ulcer: Secondary | ICD-10-CM | POA: Diagnosis not present

## 2016-06-13 DIAGNOSIS — Z09 Encounter for follow-up examination after completed treatment for conditions other than malignant neoplasm: Secondary | ICD-10-CM | POA: Insufficient documentation

## 2016-06-27 ENCOUNTER — Ambulatory Visit (INDEPENDENT_AMBULATORY_CARE_PROVIDER_SITE_OTHER): Payer: BLUE CROSS/BLUE SHIELD | Admitting: Physician Assistant

## 2016-06-27 ENCOUNTER — Encounter: Payer: Self-pay | Admitting: Physician Assistant

## 2016-06-27 VITALS — BP 120/70 | HR 76 | Ht 62.0 in | Wt 145.6 lb

## 2016-06-27 DIAGNOSIS — Z1211 Encounter for screening for malignant neoplasm of colon: Secondary | ICD-10-CM

## 2016-06-27 DIAGNOSIS — R1110 Vomiting, unspecified: Secondary | ICD-10-CM | POA: Diagnosis not present

## 2016-06-27 NOTE — Patient Instructions (Signed)
You have been scheduled for a gastric emptying scan at [facility] on Friday 07/18/16 at 7:30 am. Please arrive at least 15 minutes prior to your appointment for registration. Please make certain not to have anything to eat or drink after midnight the night before your test. Hold all stomach medications (ex: Zofran, phenergan, Reglan) 48 hours prior to your test. If you need to reschedule your appointment, please contact radiology scheduling at [phone]. _____________________________________________________________________ A gastric-emptying study measures how long it takes for food to move through your stomach. There are several ways to measure stomach emptying. In the most common test, you eat food that contains a small amount of radioactive material. A scanner that detects the movement of the radioactive material is placed over your abdomen to monitor the rate at which food leaves your stomach. This test normally takes about 4 hours to complete. _____________________________________________________________________  We have sent your demographic and insurance information to [employer]. They should contact you within the next week regarding your Cologuard (colon cancer screening) test. If you have not heard from them within the next week, please call our office at [phone].  If you are age 73 or older, your body mass index should be between 23-30. Your Body mass index is 26.63 kg/m. If this is out of the aforementioned range listed, please consider follow up with your Primary Care Provider.  If you are age 30 or younger, your body mass index should be between 19-25. Your Body mass index is 26.63 kg/m. If this is out of the aformentioned range listed, please consider follow up with your Primary Care Provider.

## 2016-06-27 NOTE — Progress Notes (Signed)
Subjective:    [name], female    DOB: 19-Jan-1966, 50 y.o.   MRN: [id number]  HPI [name] is a pleasant 50 year old white female, new today, and self referred. Her PCP is Dr. [name].. Patient does have history of adult-onset diabetes mellitus, currently requiring insulin, hypertension, cerebrovascular disease, peripheral vascular disease and restless leg syndrome. She says she has been having her current symptoms over the past year with almost daily episodes of early morning nausea followed by vomiting before she eats anything. She says these generally only occurs if she has slept the whole night through. She says she recently went back on third shift work and when she sleeps during the day and she's usually for short periods of time she doesn't have any symptoms. When she has these episodes once she vomits she feels fine and will not have any symptoms for the rest of the day. She denies any ongoing heartburn or indigestion. Her appetite is been fine her weight is been stable really does not have any early satiety type symptoms. Bowel habits have been normal. She has prescription for Reglan but this was actually prescribed for nausea and didn't seem to make any difference. She does have an analgesic on her med list but says she hasn't taken this for several months. Family history negative for colon cancer and polyps. Patient has not had prior colonoscopy and does not want a colonoscopy at this time. She denies any issues with changes in bowel habits melena or hematochezia.  Review of Systems Pertinent positive and negative review of systems were noted in the above HPI section.  All other review of systems was otherwise negative.  Outpatient Encounter Prescriptions as of 06/27/2016  Medication Sig  . aspirin EC 81 MG tablet Take 81 mg by mouth daily.  . cyclobenzaprine (FLEXERIL) 10 MG tablet Take 1 tablet (10 mg total) by mouth 3 (three) times daily as needed for muscle spasms.    Marland Kitchen glucose blood (ONE TOUCH ULTRA TEST) test strip Use once daily to check blood sugar.  DX E11.9  . HYDROcodone-acetaminophen (NORCO) 10-325 MG tablet Take 1 tablet by mouth every 8 (eight) hours as needed. (Patient taking differently: Take 1 tablet by mouth every 8 (eight) hours as needed for moderate pain. )  . Insulin Glargine (LANTUS SOLOSTAR) 100 UNIT/ML Solostar Pen Inject 50 Units into the skin 2 (two) times daily.  . Insulin Pen Needle 32G X 4 MM MISC Use as directed for insulin.DX E11.65  . Lancets (FREESTYLE) lancets DX- 250.02  Pts machine is Freestyle Freedom Lite  . LANTUS SOLOSTAR 100 UNIT/ML Solostar Pen INJECT 34 UNITS IN AM & 32 UNITS IN PM. INCREASE BY 2 UNITS EVERY 3 DAYS AS LONG AS ABOVE 100  . lisinopril (PRINIVIL,ZESTRIL) 10 MG tablet TAKE 1 TABLET (10 MG TOTAL) BY MOUTH 2 (TWO) TIMES DAILY.  . metFORMIN (GLUCOPHAGE) 1000 MG tablet TAKE 1 TABLET TWICE DAILY AND 1/2 TABLET AT NOON  . methocarbamol (ROBAXIN) 500 MG tablet Take 1 tablet (500 mg total) by mouth 2 (two) times daily.  . methylPREDNISolone (MEDROL DOSEPAK) 4 MG TBPK tablet Take following package directions  . metoCLOPramide (REGLAN) 10 MG tablet Take 1 tablet (10 mg total) by mouth every 8 (eight) hours as needed for nausea.  Marland Kitchen oxyCODONE-acetaminophen (PERCOCET/ROXICET) 5-325 MG tablet Take 2 tablets by mouth every 4 (four) hours as needed for severe pain.  . simvastatin (ZOCOR) 5 MG tablet Take 1 tablet (5 mg total) by mouth daily.  No facility-administered encounter medications on file as of 06/27/2016.    Allergies  Allergen Reactions  . Lyrica [Pregabalin] Other (See Comments)    MADE PATIENT VERY EMOTIONAL AND WOULD CRY EASILY  . Tramadol Nausea And Vomiting    Pt cant tolerate this pain med.   . Morphine And Related Nausea And Vomiting   Patient Active Problem List   Diagnosis Date Noted  . Diabetic polyneuropathy associated with diabetes mellitus due to underlying condition ([place]) 05/07/2016  .  Orthostatic hypotension 05/07/2016  . Cerebrovascular disease 04/27/2016  . Nausea with vomiting 04/01/2016  . ICH (intracerebral hemorrhage) ([place]) 03/30/2016  . Nonhealing nonsurgical wound 03/06/2016  . Visit for preventive health examination 05/21/2015  . Breast cancer screening 05/21/2015  . Arm lesion 07/02/2014  . Pain in joint, ankle and foot 04/28/2014  . Pain in limb 01/13/2014  . Neuropathic pain of both legs 01/13/2014  . Hip pain 01/12/2014  . Allergic rhinitis 03/10/2013  . Back pain 03/08/2013  . Depression 12/18/2012  . PVD (peripheral vascular disease) ([place]) 01/21/2012  . Diabetic ulcer of lower extremity ([place]) 12/22/2011  . Hyperlipidemia 12/06/2010  . Overweight(278.02) 12/06/2010  . RESTLESS LEG SYNDROME 10/25/2010  . Diabetes mellitus type II, uncontrolled ([place]) 10/07/2010  . Migraine without aura 10/07/2010  . Hereditary and idiopathic peripheral neuropathy 10/07/2010  . Essential hypertension, benign 10/07/2010  . DISTURBANCE OF SKIN SENSATION 10/07/2010  . HEART MURMUR, HX OF 10/07/2010  . NEPHROLITHIASIS, HX OF 10/07/2010   Social History   Social History  . Marital status: Single    Spouse name: N/A  . Number of children: N/A  . Years of education: N/A   Occupational History  . Not on file.   Social History Main Topics  . Smoking status: Former Smoker    Packs/day: 0.50    Quit date: 09/08/2005  . Smokeless tobacco: Never Used  . Alcohol use No  . Drug use: No  . Sexual activity: Yes    Partners: Male    Birth control/ protection: None   Other Topics Concern  . Not on file   Social History Narrative   Lives with husband in a two story home.  Has one child.     Works as a Glass blower/designer.     Education: high school.    [name] family history includes Alcohol abuse in her brother; Arthritis in her mother; Diabetes in her paternal grandmother; Healthy in her son; Leukemia in her brother; Stroke in her brother.      Objective:     Vitals:   06/27/16 0934  BP: 120/70  Pulse: 76    Physical Exam   well-developed white female in no acute distress, pleasant blood pressure 120/70 pulse 76 height 5 foot 2 weight 145 BMI 26.6. HEENT; nontraumatic normocephalic EOMI PERRLA sclera anicteric, Cardiovascular ;regular rate and rhythm with S1-S2 no murmur or gallop, Pulmonary; clear bilaterally, Abdomen ;soft, nontender there is no palpable mass or hepatosplenomegaly no appreciable succussion splash, bowel sounds are present, Rectal ;exam not done, Extremities; no clubbing cyanosis or edema skin warm and dry, Neuropsych; mood and affect appropriate      Assessment & Plan:   #30  50 year old white female diabetic with 1 year history of very frequent morning nausea followed by vomiting. Etiology of symptoms is not clear, she doesn't have any associated heartburn or indigestion and symptoms resolved as soon as she vomits. I wonder if she has gastroparesis related to her diabetes and is not emptying  gastric secretions well at nighttime Interesting lady since she has switched back to third shift work, a sleeping during the day and for much shorter periods of time she has not had any symptoms. #2 Colon cancer screening-average risk, asymptomatic and no prior screening #3 cerebrovascular disease #4 peripheral vascular disease #5 restless leg syndrome #6 hypertension  Plan; Will schedule Gastric emptying scan. Discussed colon cancer screening options with the patient and she is agreeable to doing a Cologuard stool DNA test which we will proceed with. She is aware that if this is positive then colonoscopy is indicated and she would be accepting of this. Patient will be established with Dr. Olene Floss PA-C 06/27/2016   Cc: Mosie Lukes, MD

## 2016-06-30 NOTE — Progress Notes (Signed)
?   If sxs could be related to prior stroke. Regarding Cologuard it may not be covered by her insurance so she should be aware it may cost > $500. If she prefers stool testing annual FIT may be most cost-effective in her case  Depending upon gastric emptying results EGD could be indicated  Gatha Mayer, MD, [facility]

## 2016-07-18 ENCOUNTER — Ambulatory Visit (HOSPITAL_COMMUNITY): Payer: BLUE CROSS/BLUE SHIELD

## 2016-07-29 ENCOUNTER — Ambulatory Visit: Payer: BLUE CROSS/BLUE SHIELD | Admitting: Family Medicine

## 2016-07-29 ENCOUNTER — Telehealth: Payer: Self-pay | Admitting: Family Medicine

## 2016-07-29 NOTE — Telephone Encounter (Signed)
No charge. 

## 2016-07-29 NOTE — Telephone Encounter (Signed)
At 1:53 am to make PCP aware that she will not be at her appt today. (7am) she says that she has the flu.

## 2016-08-14 ENCOUNTER — Telehealth: Payer: Self-pay | Admitting: Family Medicine

## 2016-08-14 NOTE — Telephone Encounter (Signed)
OK to use that appt.

## 2016-08-14 NOTE — Telephone Encounter (Signed)
Appointment scheduled for 09/04/16 at 3:00pm

## 2016-08-14 NOTE — Telephone Encounter (Signed)
Patient is requesting to have a CPE done before the end of the year for insurance purposes. Unfortunately there is nothing available that soon. Patient would like to know if she could be seen for her CPE by another provider she is aware that CPEs are typically done by the PCP. There is only one spot on 09/04/16 at 3:00 that I could combine appointments. Please advise.   Patient phone: [phone]

## 2016-08-26 ENCOUNTER — Encounter: Payer: Self-pay | Admitting: Family Medicine

## 2016-08-26 ENCOUNTER — Ambulatory Visit (INDEPENDENT_AMBULATORY_CARE_PROVIDER_SITE_OTHER): Payer: BLUE CROSS/BLUE SHIELD | Admitting: Family Medicine

## 2016-08-26 ENCOUNTER — Encounter (HOSPITAL_BASED_OUTPATIENT_CLINIC_OR_DEPARTMENT_OTHER): Payer: Self-pay | Admitting: *Deleted

## 2016-08-26 ENCOUNTER — Emergency Department (HOSPITAL_BASED_OUTPATIENT_CLINIC_OR_DEPARTMENT_OTHER): Payer: BLUE CROSS/BLUE SHIELD

## 2016-08-26 ENCOUNTER — Emergency Department (HOSPITAL_BASED_OUTPATIENT_CLINIC_OR_DEPARTMENT_OTHER)
Admission: EM | Admit: 2016-08-26 | Discharge: 2016-08-26 | Disposition: A | Discharge status: Home / Self Care | Payer: BLUE CROSS/BLUE SHIELD | Attending: Emergency Medicine | Admitting: Emergency Medicine

## 2016-08-26 DIAGNOSIS — L03032 Cellulitis of left toe: Secondary | ICD-10-CM

## 2016-08-26 DIAGNOSIS — E11628 Type 2 diabetes mellitus with other skin complications: Secondary | ICD-10-CM | POA: Insufficient documentation

## 2016-08-26 DIAGNOSIS — E119 Type 2 diabetes mellitus without complications: Secondary | ICD-10-CM | POA: Insufficient documentation

## 2016-08-26 DIAGNOSIS — I10 Essential (primary) hypertension: Secondary | ICD-10-CM

## 2016-08-26 DIAGNOSIS — Z87891 Personal history of nicotine dependence: Secondary | ICD-10-CM | POA: Diagnosis not present

## 2016-08-26 DIAGNOSIS — I739 Peripheral vascular disease, unspecified: Secondary | ICD-10-CM

## 2016-08-26 DIAGNOSIS — L02612 Cutaneous abscess of left foot: Secondary | ICD-10-CM | POA: Insufficient documentation

## 2016-08-26 DIAGNOSIS — Z79899 Other long term (current) drug therapy: Secondary | ICD-10-CM | POA: Insufficient documentation

## 2016-08-26 DIAGNOSIS — Z7982 Long term (current) use of aspirin: Secondary | ICD-10-CM | POA: Insufficient documentation

## 2016-08-26 DIAGNOSIS — J069 Acute upper respiratory infection, unspecified: Secondary | ICD-10-CM

## 2016-08-26 DIAGNOSIS — R509 Fever, unspecified: Secondary | ICD-10-CM | POA: Diagnosis present

## 2016-08-26 DIAGNOSIS — Z794 Long term (current) use of insulin: Secondary | ICD-10-CM | POA: Insufficient documentation

## 2016-08-26 DIAGNOSIS — L089 Local infection of the skin and subcutaneous tissue, unspecified: Secondary | ICD-10-CM | POA: Diagnosis not present

## 2016-08-26 DIAGNOSIS — L03116 Cellulitis of left lower limb: Secondary | ICD-10-CM | POA: Diagnosis not present

## 2016-08-26 DIAGNOSIS — E1169 Type 2 diabetes mellitus with other specified complication: Secondary | ICD-10-CM | POA: Diagnosis not present

## 2016-08-26 HISTORY — DX: Local infection of the skin and subcutaneous tissue, unspecified: L08.9

## 2016-08-26 HISTORY — DX: Type 2 diabetes mellitus with other skin complications: E11.628

## 2016-08-26 LAB — CBC WITH DIFFERENTIAL/PLATELET
Basophils Absolute: 0 10*3/uL (ref 0.0–0.1)
Basophils Relative: 0 %
Eosinophils Absolute: 0 10*3/uL (ref 0.0–0.7)
Eosinophils Relative: 0 %
HCT: 36.9 % (ref 36.0–46.0)
Hemoglobin: 12.5 g/dL (ref 12.0–15.0)
Lymphocytes Relative: 7 %
Lymphs Abs: 1.3 10*3/uL (ref 0.7–4.0)
MCH: 29.4 pg (ref 26.0–34.0)
MCHC: 33.9 g/dL (ref 30.0–36.0)
MCV: 86.8 fL (ref 78.0–100.0)
Monocytes Absolute: 1.9 10*3/uL — ABNORMAL HIGH (ref 0.1–1.0)
Monocytes Relative: 10 %
Neutro Abs: 16.3 10*3/uL — ABNORMAL HIGH (ref 1.7–7.7)
Neutrophils Relative %: 84 %
Platelets: 351 10*3/uL (ref 150–400)
RBC: 4.25 MIL/uL (ref 3.87–5.11)
RDW: 12.7 % (ref 11.5–15.5)
WBC: 19.4 10*3/uL — ABNORMAL HIGH (ref 4.0–10.5)

## 2016-08-26 LAB — COMPREHENSIVE METABOLIC PANEL
ALT: 22 U/L (ref 14–54)
AST: 21 U/L (ref 15–41)
Albumin: 3.6 g/dL (ref 3.5–5.0)
Alkaline Phosphatase: 68 U/L (ref 38–126)
Anion gap: 11 (ref 5–15)
BUN: 18 mg/dL (ref 6–20)
CO2: 24 mmol/L (ref 22–32)
Calcium: 9.2 mg/dL (ref 8.9–10.3)
Chloride: 101 mmol/L (ref 101–111)
Creatinine, Ser: 0.51 mg/dL (ref 0.44–1.00)
GFR calc Af Amer: >60 mL/min (ref >60)
GFR calc non Af Amer: >60 mL/min (ref >60)
Glucose, Bld: 117 mg/dL — ABNORMAL HIGH (ref 65–99)
Potassium: 3.8 mmol/L (ref 3.5–5.1)
Sodium: 136 mmol/L (ref 135–145)
Total Bilirubin: 1 mg/dL (ref 0.3–1.2)
Total Protein: 7.5 g/dL (ref 6.5–8.1)

## 2016-08-26 LAB — CBG MONITORING, ED: Glucose-Capillary: 109 mg/dL — ABNORMAL HIGH (ref 65–99)

## 2016-08-26 MED ORDER — LIDOCAINE HCL 1 % IJ SOLN
10.0000 mL | Freq: Once | INTRAMUSCULAR | Status: AC
  Administered 2016-08-26: 10 mL via INTRADERMAL
  Filled 2016-08-26: qty 20

## 2016-08-26 MED ORDER — CEPHALEXIN 500 MG PO CAPS: 500.0000 mg | ORAL_CAPSULE | Freq: Four times a day (QID) | ORAL | 0 refills | Status: DC

## 2016-08-26 MED ORDER — ACETAMINOPHEN 325 MG PO TABS
650.0000 mg | ORAL_TABLET | Freq: Once | ORAL | Status: AC
  Administered 2016-08-26: 650 mg via ORAL
  Filled 2016-08-26: qty 2

## 2016-08-26 MED ORDER — SODIUM CHLORIDE 0.9 % IV BOLUS (SEPSIS)
1000.0000 mL | Freq: Once | INTRAVENOUS | Status: AC
  Administered 2016-08-26: 1000 mL via INTRAVENOUS

## 2016-08-26 MED ORDER — SULFAMETHOXAZOLE-TRIMETHOPRIM 800-160 MG PO TABS: 1.0000 | ORAL_TABLET | Freq: Two times a day (BID) | ORAL | 0 refills | Status: AC

## 2016-08-26 NOTE — Patient Instructions (Addendum)
Peripheral Neuropathy Peripheral neuropathy is a type of nerve damage. It affects nerves that carry signals between the spinal cord and other parts of the body. These are called peripheral nerves. With peripheral neuropathy, one nerve or a group of nerves may be damaged. What are the causes? Many things can damage peripheral nerves. For some people with peripheral neuropathy, the cause is unknown. Some causes include:  Diabetes. This is the most common cause of peripheral neuropathy.  Injury to a nerve.  Pressure or stress on a nerve that lasts a long time.  Too little vitamin B. Alcoholism can lead to this.  Infections.  Autoimmune diseases, such as multiple sclerosis and systemic lupus erythematosus.  Inherited nerve diseases.  Some medicines, such as cancer drugs.  Toxic substances, such as lead and mercury.  Too little blood flowing to the legs.  Kidney disease.  Thyroid disease.  What are the signs or symptoms? Different people have different symptoms. The symptoms you have will depend on which of your nerves is damaged. Common symptoms include:  Loss of feeling (numbness) in the feet and hands.  Tingling in the feet and hands.  Pain that burns.  Very sensitive skin.  Weakness.  Not being able to move a part of the body (paralysis).  Muscle twitching.  Clumsiness or poor coordination.  Loss of balance.  Not being able to control your bladder.  Feeling dizzy.  Sexual problems.  How is this diagnosed? Peripheral neuropathy is a symptom, not a disease. Finding the cause of peripheral neuropathy can be hard. To figure that out, your health care provider will take a medical history and do a physical exam. A neurological exam will also be done. This involves checking things affected by your brain, spinal cord, and nerves (nervous system). For example, your health care provider will check your reflexes, how you move, and what you can feel. Other types of tests  may also be ordered, such as:  Blood tests.  A test of the fluid in your spinal cord.  Imaging tests, such as CT scans or an MRI.  Electromyography (EMG). This test checks the nerves that control muscles.  Nerve conduction velocity tests. These tests check how fast messages pass through your nerves.  Nerve biopsy. A small piece of nerve is removed. It is then checked under a microscope.  How is this treated?  Medicine is often used to treat peripheral neuropathy. Medicines may include: ? Pain-relieving medicines. Prescription or over-the-counter medicine may be suggested. ? Antiseizure medicine. This may be used for pain. ? Antidepressants. These also may help ease pain from neuropathy. ? Lidocaine. This is a numbing medicine. You might wear a patch or be given a shot. ? Mexiletine. This medicine is typically used to help control irregular heart rhythms.  Surgery. Surgery may be needed to relieve pressure on a nerve or to destroy a nerve that is causing pain.  Physical therapy to help movement.  Assistive devices to help movement. Follow these instructions at home:  Only take over-the-counter or prescription medicines as directed by your health care provider. Follow the instructions carefully for any given medicines. Do not take any other medicines without first getting approval from your health care provider.  If you have diabetes, work closely with your health care provider to keep your blood sugar under control.  If you have numbness in your feet: ? Check every day for signs of injury or infection. Watch for redness, warmth, and swelling. ? Wear padded socks and comfortable   shoes. These help protect your feet.  Do not do things that put pressure on your damaged nerve.  Do not smoke. Smoking keeps blood from getting to damaged nerves.  Avoid or limit alcohol. Too much alcohol can cause a lack of B vitamins. These vitamins are needed for healthy nerves.  Develop a good  support system. Coping with peripheral neuropathy can be stressful. Talk to a mental health specialist or join a support group if you are struggling.  Follow up with your health care provider as directed. Contact a health care provider if:  You have new signs or symptoms of peripheral neuropathy.  You are struggling emotionally from dealing with peripheral neuropathy.  You have a fever. Get help right away if:  You have an injury or infection that is not healing.  You feel very dizzy or begin vomiting.  You have chest pain.  You have trouble breathing. This information is not intended to replace advice given to you by your health care provider. Make sure you discuss any questions you have with your health care provider. Document Released: 08/15/2002 Document Revised: 01/31/2016 Document Reviewed: 05/02/2013 Elsevier Interactive Patient Education  2017 [employer]  

## 2016-08-26 NOTE — Progress Notes (Signed)
Pre visit review using our clinic review tool, if applicable. No additional management support is needed unless otherwise documented below in the visit note. 

## 2016-08-26 NOTE — Assessment & Plan Note (Signed)
Struggles with poor circulation due to poorly controlled diabetes and has had bad diabetic foot ulcers in past.

## 2016-08-26 NOTE — ED Provider Notes (Signed)
[name] Provider Note   CSN: [id number] Arrival date & time: 08/26/16  1529     History   Chief Complaint Chief Complaint  Patient presents with  . Toe Injury    HPI [name] is a 50 y.o. female.  50 yo F with a chief complaint of fever, chills, cough and muscle aches. Going on for the past couple days. Also having pain to the plantar surface of her left great toe. Denies difficulty controlling her blood sugar recently denies nausea or vomiting. Denies shortness of breath. States everyone in her office has the same upper respiratory illness. Went to her family physician who found her to have a fever and tachycardia and was sent down here to evaluate.   The history is provided by the patient.  Illness  This is a new problem. The current episode started 2 days ago. The problem occurs constantly. The problem has not changed since onset.Pertinent negatives include no chest pain, no headaches and no shortness of breath. Nothing aggravates the symptoms. Nothing relieves the symptoms. She has tried nothing for the symptoms. The treatment provided no relief.    Past Medical History:  Diagnosis Date  . Acute bronchitis 07/08/2011  . COMMON MIGRAINE 10/07/2010  . CVA (cerebral infarction) 04/19/2013  . Depression 12/18/2012  . Diabetes mellitus type II, uncontrolled ([place]) 10/07/2010   Qualifier: Diagnosis of  By: Charlett Blake MD, [name]    . Diabetic foot infection ([place]) 08/26/2016  . Disturbance of skin sensation 10/07/2010  . Essential hypertension, benign 10/07/2010  . Heart murmur   . HEART MURMUR, HX OF 10/07/2010  . Hyperlipidemia 12/06/2010  . Hypertension   . NEPHROLITHIASIS, HX OF 10/07/2010  . Overweight(278.02) 12/06/2010  . PERIPHERAL NEUROPATHY, FEET 10/07/2010  . PVD (peripheral vascular disease) ([place]) 01/21/2012  . RESTLESS LEG SYNDROME 10/25/2010  . SOB (shortness of breath) 04/01/2012  . Stroke [facility]) 2014, 2017  . Weakness of right side of body 04/19/2013      Patient Active Problem List   Diagnosis Date Noted  . Diabetic foot infection ([place]) 08/26/2016  . Diabetic polyneuropathy associated with diabetes mellitus due to underlying condition ([place]) 05/07/2016  . Orthostatic hypotension 05/07/2016  . Cerebrovascular disease 04/27/2016  . Nausea with vomiting 04/01/2016  . ICH (intracerebral hemorrhage) ([place]) 03/30/2016  . Nonhealing nonsurgical wound 03/06/2016  . Visit for preventive health examination 05/21/2015  . Breast cancer screening 05/21/2015  . Arm lesion 07/02/2014  . Pain in joint, ankle and foot 04/28/2014  . Pain in limb 01/13/2014  . Neuropathic pain of both legs 01/13/2014  . Hip pain 01/12/2014  . Allergic rhinitis 03/10/2013  . Back pain 03/08/2013  . Depression 12/18/2012  . PVD (peripheral vascular disease) ([place]) 01/21/2012  . Diabetic ulcer of lower extremity ([place]) 12/22/2011  . Hyperlipidemia 12/06/2010  . Overweight(278.02) 12/06/2010  . RESTLESS LEG SYNDROME 10/25/2010  . Migraine without aura 10/07/2010  . Hereditary and idiopathic peripheral neuropathy 10/07/2010  . Essential hypertension, benign 10/07/2010  . DISTURBANCE OF SKIN SENSATION 10/07/2010  . HEART MURMUR, HX OF 10/07/2010  . NEPHROLITHIASIS, HX OF 10/07/2010    Past Surgical History:  Procedure Laterality Date  . None    . WISDOM TOOTH EXTRACTION      OB History    No data available       Home Medications    Prior to Admission medications   Medication Sig Start Date End Date Taking? Authorizing Provider  aspirin EC 81 MG tablet Take 81  mg by mouth daily.    Historical Provider, MD  cephALEXin (KEFLEX) 500 MG capsule Take 1 capsule (500 mg total) by mouth 4 (four) times daily. 08/26/16   Deno Etienne, DO  cyclobenzaprine (FLEXERIL) 10 MG tablet Take 1 tablet (10 mg total) by mouth 3 (three) times daily as needed for muscle spasms. Patient not taking: Reported on 08/26/2016 05/14/16   Brunetta Jeans, PA-C  glucose blood (ONE TOUCH  ULTRA TEST) test strip Use once daily to check blood sugar.  DX E11.9 01/04/16   Mosie Lukes, MD  HYDROcodone-acetaminophen (NORCO) 10-325 MG tablet Take 1 tablet by mouth every 8 (eight) hours as needed. Patient not taking: Reported on 08/26/2016 05/14/16   Brunetta Jeans, PA-C  Insulin Glargine (LANTUS SOLOSTAR) 100 UNIT/ML Solostar Pen Inject 50 Units into the skin 2 (two) times daily. 10/09/15   Mosie Lukes, MD  Insulin Pen Needle 32G X 4 MM MISC Use as directed for insulin.DX E11.65 03/03/16   Mosie Lukes, MD  Lancets (FREESTYLE) lancets DX- 250.02  Pts machine is Freestyle Freedom Lite 04/20/13   Mosie Lukes, MD  LANTUS SOLOSTAR 100 UNIT/ML Solostar Pen INJECT 34 UNITS IN AM & 32 UNITS IN PM. INCREASE BY 2 UNITS EVERY 3 DAYS AS LONG AS ABOVE 100 05/12/16   Mosie Lukes, MD  lisinopril (PRINIVIL,ZESTRIL) 10 MG tablet TAKE 1 TABLET (10 MG TOTAL) BY MOUTH 2 (TWO) TIMES DAILY. 04/01/16   Mosie Lukes, MD  metFORMIN (GLUCOPHAGE) 1000 MG tablet TAKE 1 TABLET TWICE DAILY AND 1/2 TABLET AT NOON 04/01/16   Mosie Lukes, MD  methocarbamol (ROBAXIN) 500 MG tablet Take 1 tablet (500 mg total) by mouth 2 (two) times daily. Patient not taking: Reported on 08/26/2016 05/17/16   Fransico Meadow, PA-C  metoCLOPramide (REGLAN) 10 MG tablet Take 1 tablet (10 mg total) by mouth every 8 (eight) hours as needed for nausea. 03/28/16   Malvin Johns, MD  oxyCODONE-acetaminophen (PERCOCET/ROXICET) 5-325 MG tablet Take 2 tablets by mouth every 4 (four) hours as needed for severe pain. 05/17/16   Fransico Meadow, PA-C  simvastatin (ZOCOR) 5 MG tablet Take 1 tablet (5 mg total) by mouth daily. 05/19/16   Mosie Lukes, MD  sulfamethoxazole-trimethoprim (BACTRIM DS,SEPTRA DS) 800-160 MG tablet Take 1 tablet by mouth 2 (two) times daily. 08/26/16 09/02/16  Deno Etienne, DO    Family History Family History  Problem Relation Age of Onset  . Arthritis Mother   . Stroke Brother     previous smoker  . Alcohol abuse  Brother     in remission  . Leukemia Brother   . Diabetes Paternal Grandmother   . Healthy Son     Social History Social History  Substance Use Topics  . Smoking status: Former Smoker    Packs/day: 0.50    Quit date: 09/08/2005  . Smokeless tobacco: Never Used  . Alcohol use No     Allergies   Lyrica [pregabalin]; Tramadol; and Morphine and related   Review of Systems Review of Systems  Constitutional: Positive for fever. Negative for chills.  HENT: Positive for congestion. Negative for rhinorrhea.   Eyes: Negative for redness and visual disturbance.  Respiratory: Positive for cough. Negative for shortness of breath and wheezing.   Cardiovascular: Negative for chest pain and palpitations.  Gastrointestinal: Negative for nausea and vomiting.  Genitourinary: Negative for dysuria and urgency.  Musculoskeletal: Negative for arthralgias and myalgias.  Skin: Positive for color change  and wound. Negative for pallor.  Neurological: Negative for dizziness and headaches.     Physical Exam Updated Vital Signs BP 123/77   Pulse 102   Temp 100.1 F (37.8 C) (Oral)   Resp 19   LMP 02/12/2011 (Exact Date)   SpO2 95%   Physical Exam  Constitutional: She is oriented to person, place, and time. She appears well-developed and well-nourished. No distress.  HENT:  Head: Normocephalic and atraumatic.  Swollen turbinates, posterior nasal drip, no noted sinus ttp, tm normal bilaterally.    Eyes: EOM are normal. Pupils are equal, round, and reactive to light.  Neck: Normal range of motion. Neck supple.  Cardiovascular: Regular rhythm.  Tachycardia present.  Exam reveals no gallop and no friction rub.   No murmur heard. Pulmonary/Chest: Effort normal. She has no wheezes. She has no rales.  Abdominal: Soft. She exhibits no distension and no mass. There is no tenderness. There is no guarding.  Musculoskeletal: She exhibits no edema or tenderness.       Feet:  Neurological: She is  alert and oriented to person, place, and time.  Skin: Skin is warm and dry. She is not diaphoretic.  Psychiatric: She has a normal mood and affect. Her behavior is normal.  Nursing note and vitals reviewed.    ED Treatments / Results  Labs (all labs ordered are listed, but only abnormal results are displayed) Labs Reviewed  CBC WITH DIFFERENTIAL/PLATELET - Abnormal; Notable for the following:       Result Value   WBC 19.4 (*)    Neutro Abs 16.3 (*)    Monocytes Absolute 1.9 (*)    All other components within normal limits  COMPREHENSIVE METABOLIC PANEL - Abnormal; Notable for the following:    Glucose, Bld 117 (*)    All other components within normal limits  CBG MONITORING, ED - Abnormal; Notable for the following:    Glucose-Capillary 109 (*)    All other components within normal limits  CULTURE, BLOOD (ROUTINE X 2)  CULTURE, BLOOD (ROUTINE X 2)    EKG  EKG Interpretation  Date/Time:  Tuesday August 26 2016 16:04:01 EST Ventricular Rate:  109 PR Interval:    QRS Duration: 81 QT Interval:  306 QTC Calculation: 412 R Axis:   42 Text Interpretation:  Sinus tachycardia Borderline T abnormalities, inferior leads No significant change since last tracing Confirmed by FLOYD MD, DANIEL [phone]) on 08/26/2016 4:41:41 PM       Radiology Dg Chest 2 View  Result Date: 08/26/2016 CLINICAL DATA:  Congestion.  Fever. EXAM: CHEST  2 VIEW COMPARISON:  11/05/2002 report. FINDINGS: Mediastinum hilar structures normal. Mild cardiomegaly. Low lung volumes. No focal infiltrate. No pleural effusion or pneumothorax. IMPRESSION: 1.  Mild cardiomegaly.  2. Low lung volumes.  No focal infiltrate. Electronically Signed   By: Marcello Moores  Register   On: 08/26/2016 17:19    Procedures .Marland KitchenIncision and Drainage Date/Time: 08/26/2016 4:53 PM Performed by: Tyrone Nine DAN Authorized by: Deno Etienne   Consent:    Consent obtained:  Verbal   Consent given by:  Patient   Risks discussed:  Bleeding,  incomplete drainage, infection and damage to other organs   Alternatives discussed:  No treatment, observation and alternative treatment Location:    Type:  Abscess   Size:  2cm   Location:  Lower extremity   Lower extremity location:  Toe   Toe location:  L big toe Pre-procedure details:    Skin preparation:  Chloraprep Anesthesia (see MAR  for exact dosages):    Anesthesia method:  Local infiltration   Local anesthetic:  Lidocaine 1% w/o epi Procedure type:    Complexity:  Complex Procedure details:    Needle aspiration: no     Incision types:  Single straight   Incision depth:  Submucosal   Scalpel blade:  11   Wound management:  Probed and deloculated and irrigated with saline   Drainage:  Purulent   Drainage amount:  Moderate   Wound treatment:  Wound left open   Packing materials:  None Post-procedure details:    Patient tolerance of procedure:  Tolerated well, no immediate complications    (including critical care time)  Emergency Focused Ultrasound Exam Limited Ultrasound of Soft Tissue   Performed and interpreted by Dr. Tyrone Nine Indication: evaluation for infection or foreign body Transverse and Sagittal views of plantar surface of the left great toe are obtained in real time for the purposes of evaluation of skin and underlying soft tissues.  Findings:  heterogeneous fluid collection, and hyperemia/edema of surrounding tissue Interpretation:  abscess, with cellulitis Images archived electronically.  CPT Codes:   Lower extremity [phone]     Medications Ordered in ED Medications  acetaminophen (TYLENOL) tablet 650 mg (650 mg Oral Given 08/26/16 1555)  sodium chloride 0.9 % bolus 1,000 mL (0 mLs Intravenous Stopped 08/26/16 1728)  lidocaine (XYLOCAINE) 1 % (with pres) injection 10 mL (10 mLs Intradermal Given 08/26/16 1655)     Initial Impression / Assessment and Plan / ED Course  I have reviewed the triage vital signs and the nursing notes.  Pertinent  labs & imaging results that were available during my care of the patient were reviewed by me and considered in my medical decision making (see chart for details).  Clinical Course     50 yo F With a chief complaint of fevers chills and myalgias. Likely related to her upper respiratory infection area also complicated by the fact that she is diabetic and has a wound to the left great toe.  Patient feels much better after IV fluids and tylenol.  Some mild tachycardia, patient requesting discharge at this time. Start on abx with surrounding cellulitis on [name].   7:08 PM:  I have discussed the diagnosis/risks/treatment options with the patient and family and believe the pt to be eligible for discharge home to follow-up with PCP. We also discussed returning to the ED immediately if new or worsening sx occur. We discussed the sx which are most concerning (e.g., sudden worsening pain, fever, inability to tolerate by mouth) that necessitate immediate return. Medications administered to the patient during their visit and any new prescriptions provided to the patient are listed below.  Medications given during this visit Medications  acetaminophen (TYLENOL) tablet 650 mg (650 mg Oral Given 08/26/16 1555)  sodium chloride 0.9 % bolus 1,000 mL (0 mLs Intravenous Stopped 08/26/16 1728)  lidocaine (XYLOCAINE) 1 % (with pres) injection 10 mL (10 mLs Intradermal Given 08/26/16 1655)     The patient appears reasonably screen and/or stabilized for discharge and I doubt any other medical condition or other [facility] requiring further screening, evaluation, or treatment in the ED at this time prior to discharge.    Final Clinical Impressions(s) / ED Diagnoses   Final diagnoses:  Upper respiratory tract infection, unspecified type  Cellulitis and abscess of toe of left foot    New Prescriptions Discharge Medication List as of 08/26/2016  5:54 PM    START taking these medications  Details  cephALEXin (KEFLEX)  500 MG capsule Take 1 capsule (500 mg total) by mouth 4 (four) times daily., Starting Tue 08/26/2016, Print    sulfamethoxazole-trimethoprim (BACTRIM DS,SEPTRA DS) 800-160 MG tablet Take 1 tablet by mouth 2 (two) times daily., Starting Tue 08/26/2016, Until Tue 09/02/2016, [place], DO 08/26/16 [id number]

## 2016-08-26 NOTE — Progress Notes (Signed)
Subjective:    [name], female    DOB: 21-Oct-1965, 50 y.o.   MRN: [id number]  Chief Complaint  Patient presents with  . knot on side of left great toe    cut under toe and bruising, noticed yesterday    HPI Patient is in today for evaluation of a rapidly worsening infection in left great toe. Yesterday morning she felt well. She is a poorly controlled diabetic with peripheral neuropathy injured her left great toe yesterday afternoon, prior to that felt well but over past 24 hours has developed fevers, chills, anorexia, malaise, myalgias and increasing pain in left foot and leg. Is noted to have worsening tachycardia and fever here in office and lesion at base of toe has gone from a split in the skin to the toe being swollen and mottled. She denies CP/palp/SOB/GU concerns  Past Medical History:  Diagnosis Date  . Acute bronchitis 07/08/2011  . COMMON MIGRAINE 10/07/2010  . CVA (cerebral infarction) 04/19/2013  . Depression 12/18/2012  . Diabetes mellitus type II, uncontrolled ([place]) 10/07/2010   Qualifier: Diagnosis of  By: Charlett Blake MD, [name]    . Diabetic foot infection ([place]) 08/26/2016  . Disturbance of skin sensation 10/07/2010  . Essential hypertension, benign 10/07/2010  . Heart murmur   . HEART MURMUR, HX OF 10/07/2010  . Hyperlipidemia 12/06/2010  . Hypertension   . NEPHROLITHIASIS, HX OF 10/07/2010  . Overweight(278.02) 12/06/2010  . PERIPHERAL NEUROPATHY, FEET 10/07/2010  . PVD (peripheral vascular disease) ([place]) 01/21/2012  . RESTLESS LEG SYNDROME 10/25/2010  . SOB (shortness of breath) 04/01/2012  . Stroke [facility]) 2014, 2017  . Weakness of right side of body 04/19/2013    Past Surgical History:  Procedure Laterality Date  . None    . WISDOM TOOTH EXTRACTION      Family History  Problem Relation Age of Onset  . Arthritis Mother   . Stroke Brother     previous smoker  . Alcohol abuse Brother     in remission  . Leukemia Brother   . Diabetes Paternal  Grandmother   . Healthy Son     Social History   Social History  . Marital status: Single    Spouse name: N/A  . Number of children: N/A  . Years of education: N/A   Occupational History  . Not on file.   Social History Main Topics  . Smoking status: Former Smoker    Packs/day: 0.50    Quit date: 09/08/2005  . Smokeless tobacco: Never Used  . Alcohol use No  . Drug use: No  . Sexual activity: Yes    Partners: Male    Birth control/ protection: None   Other Topics Concern  . Not on file   Social History Narrative   Lives with husband in a two story home.  Has one child.     Works as a Glass blower/designer.     Education: high school.    Outpatient Medications Prior to Visit  Medication Sig Dispense Refill  . aspirin EC 81 MG tablet Take 81 mg by mouth daily.    Marland Kitchen glucose blood (ONE TOUCH ULTRA TEST) test strip Use once daily to check blood sugar.  DX E11.9 100 each 11  . Insulin Glargine (LANTUS SOLOSTAR) 100 UNIT/ML Solostar Pen Inject 50 Units into the skin 2 (two) times daily. 15 mL 4  . Insulin Pen Needle 32G X 4 MM MISC Use as directed for insulin.DX E11.65 100 each 6  .  Lancets (FREESTYLE) lancets DX- 250.02  Pts machine is Freestyle Freedom Lite 100 each 5  . LANTUS SOLOSTAR 100 UNIT/ML Solostar Pen INJECT 34 UNITS IN AM & 32 UNITS IN PM. INCREASE BY 2 UNITS EVERY 3 DAYS AS LONG AS ABOVE 100 15 pen 4  . lisinopril (PRINIVIL,ZESTRIL) 10 MG tablet TAKE 1 TABLET (10 MG TOTAL) BY MOUTH 2 (TWO) TIMES DAILY. 60 tablet 6  . metFORMIN (GLUCOPHAGE) 1000 MG tablet TAKE 1 TABLET TWICE DAILY AND 1/2 TABLET AT NOON 75 tablet 1  . metoCLOPramide (REGLAN) 10 MG tablet Take 1 tablet (10 mg total) by mouth every 8 (eight) hours as needed for nausea. 15 tablet 0  . oxyCODONE-acetaminophen (PERCOCET/ROXICET) 5-325 MG tablet Take 2 tablets by mouth every 4 (four) hours as needed for severe pain. 15 tablet 0  . simvastatin (ZOCOR) 5 MG tablet Take 1 tablet (5 mg total) by mouth daily. 30  tablet 1  . cyclobenzaprine (FLEXERIL) 10 MG tablet Take 1 tablet (10 mg total) by mouth 3 (three) times daily as needed for muscle spasms. (Patient not taking: Reported on 08/26/2016) 30 tablet 0  . HYDROcodone-acetaminophen (NORCO) 10-325 MG tablet Take 1 tablet by mouth every 8 (eight) hours as needed. (Patient not taking: Reported on 08/26/2016) 15 tablet 0  . methocarbamol (ROBAXIN) 500 MG tablet Take 1 tablet (500 mg total) by mouth 2 (two) times daily. (Patient not taking: Reported on 08/26/2016) 20 tablet 0  . methylPREDNISolone (MEDROL DOSEPAK) 4 MG TBPK tablet Take following package directions 21 tablet 0   No facility-administered medications prior to visit.     Allergies  Allergen Reactions  . Lyrica [Pregabalin] Other (See Comments)    MADE PATIENT VERY EMOTIONAL AND WOULD CRY EASILY  . Tramadol Nausea And Vomiting    Pt cant tolerate this pain med.   . Morphine And Related Nausea And Vomiting    Review of Systems  Reason unable to perform ROS: foot pain, cut under left great toe.  Constitutional: Positive for chills, fever and malaise/fatigue.  Eyes: Negative for blurred vision.  Respiratory: Negative for cough and shortness of breath.   Cardiovascular: Negative for chest pain and palpitations.  Gastrointestinal: Positive for nausea. Negative for abdominal pain, diarrhea and vomiting.  Musculoskeletal: Positive for joint pain. Negative for back pain.       Left foot pain, cut under left great toe, with brusiing  Skin: Negative for rash.  Neurological: Positive for tingling and weakness. Negative for loss of consciousness and headaches.       Objective:    Physical Exam  Constitutional: She is oriented to person, place, and time. She appears well-developed and well-nourished. No distress.  HENT:  Head: Normocephalic and atraumatic.  Eyes: Conjunctivae are normal.  Neck: Normal range of motion. No thyromegaly present.  Cardiovascular: Regular rhythm.   tachycardia   Pulmonary/Chest: Effort normal and breath sounds normal. She has no wheezes.  Abdominal: Soft. Bowel sounds are normal. There is no tenderness.  Musculoskeletal: Normal range of motion. She exhibits no edema or deformity.  Left great toe at plantar surface, split in skin with swelling and ecchymosis of toe noted with swelling of foot noted but less intense.   Neurological: She is alert and oriented to person, place, and time.  Skin: Skin is warm and dry. She is not diaphoretic.  Psychiatric: She has a normal mood and affect.    BP 130/72 (BP Location: Right Arm, Cuff Size: Normal)   Pulse (!) 116  Temp (!) 100.5 F (38.1 C) (Oral)   Resp 16   Ht 5\' 2"  (1.575 m)   Wt 147 lb (66.7 kg)   LMP 02/12/2011 (Exact Date)   SpO2 95%   BMI 26.89 kg/m  Wt Readings from Last 3 Encounters:  08/26/16 147 lb (66.7 kg)  06/27/16 145 lb 9.6 oz (66 kg)  05/26/16 145 lb (65.8 kg)     Lab Results  Component Value Date   WBC 11.5 (H) 05/18/2016   HGB 13.3 05/18/2016   HCT 38.6 05/18/2016   PLT 395 05/18/2016   GLUCOSE 336 (H) 05/18/2016   CHOL 175 03/30/2016   TRIG 70 03/30/2016   HDL 38 (L) 03/30/2016   LDLDIRECT 136.4 04/01/2012   LDLCALC 123 (H) 03/30/2016   ALT 21 05/18/2016   AST 22 05/18/2016   NA 136 05/18/2016   K 4.9 05/18/2016   CL 99 (L) 05/18/2016   CREATININE 0.63 05/18/2016   BUN 19 05/18/2016   CO2 29 05/18/2016   TSH 2.15 11/20/2015   INR 1.07 03/30/2016   HGBA1C 12.6 (H) 03/30/2016   MICROALBUR 1.1 12/22/2011    Lab Results  Component Value Date   TSH 2.15 11/20/2015   Lab Results  Component Value Date   WBC 11.5 (H) 05/18/2016   HGB 13.3 05/18/2016   HCT 38.6 05/18/2016   MCV 83.0 05/18/2016   PLT 395 05/18/2016   Lab Results  Component Value Date   NA 136 05/18/2016   K 4.9 05/18/2016   CO2 29 05/18/2016   GLUCOSE 336 (H) 05/18/2016   BUN 19 05/18/2016   CREATININE 0.63 05/18/2016   BILITOT 0.9 05/18/2016   ALKPHOS 81 05/18/2016   AST 22  05/18/2016   ALT 21 05/18/2016   PROT 8.2 (H) 05/18/2016   ALBUMIN 4.1 05/18/2016   CALCIUM 9.3 05/18/2016   ANIONGAP 8 05/18/2016   GFR 107.46 11/27/2015   Lab Results  Component Value Date   CHOL 175 03/30/2016   Lab Results  Component Value Date   HDL 38 (L) 03/30/2016   Lab Results  Component Value Date   LDLCALC 123 (H) 03/30/2016   Lab Results  Component Value Date   TRIG 70 03/30/2016   Lab Results  Component Value Date   CHOLHDL 4.6 03/30/2016   Lab Results  Component Value Date   HGBA1C 12.6 (H) 03/30/2016       Assessment & Plan:   Problem List Items Addressed This Visit    Essential hypertension, benign    Well controlled, no changes to meds. Encouraged heart healthy diet such as the DASH diet and exercise as tolerated.       PVD (peripheral vascular disease) ([place])    Struggles with poor circulation due to poorly controlled diabetes and has had bad diabetic foot ulcers in past.       Diabetic foot infection ([place])    Poorly controlled diabetic with peripheral neuropathy injured her left great toe yesterday afternoon, prior to that felt well but over past 24 hours has developed fevers, chills, anorexia, malaise, myalgias and increasing pain in left foot and leg. Is noted to have worsening tachycardia and fever here in office and lesion at base of toe has gone from a split in the skin to the toe being swollen and mottled. Will send to ER for evaluation, ER notified         I have discontinued Ms. Kroeger's methylPREDNISolone. I am also having her maintain her freestyle, Insulin Glargine,  glucose blood, Insulin Pen Needle, metoCLOPramide, lisinopril, metFORMIN, aspirin EC, LANTUS SOLOSTAR, cyclobenzaprine, HYDROcodone-acetaminophen, oxyCODONE-acetaminophen, methocarbamol, and simvastatin.  No orders of the defined types were placed in this encounter.  Nurse served as Education administrator during visit. History, physical and plan performed by me. Documentation and  orders reviewed and attested to Penni Homans, MD

## 2016-08-26 NOTE — Assessment & Plan Note (Signed)
Well controlled, no changes to meds. Encouraged heart healthy diet such as the DASH diet and exercise as tolerated.  °

## 2016-08-26 NOTE — Assessment & Plan Note (Signed)
Poorly controlled diabetic with peripheral neuropathy injured her left great toe yesterday afternoon, prior to that felt well but over past 24 hours has developed fevers, chills, anorexia, malaise, myalgias and increasing pain in left foot and leg. Is noted to have worsening tachycardia and fever here in office and lesion at base of toe has gone from a split in the skin to the toe being swollen and mottled. Will send to ER for evaluation, ER notified

## 2016-08-26 NOTE — ED Notes (Signed)
ED Provider at bedside to discuss discharge instructions   Pt verbalized understanding of discharge instructions and denies any further questions at this time.

## 2016-08-26 NOTE — ED Triage Notes (Signed)
Pt reports having a left great toe injury that she did not know about until yesterday.  Today, pt running a fever and feels terrible.  Denies extreme pain.  Noted to have a very bruised pad of her great toe with open skin.  No bleeding noted.  Pt lethargic but oriented.

## 2016-08-26 NOTE — ED Notes (Signed)
Patient transported to X-ray 

## 2016-08-27 ENCOUNTER — Telehealth (HOSPITAL_BASED_OUTPATIENT_CLINIC_OR_DEPARTMENT_OTHER): Payer: Self-pay | Admitting: Emergency Medicine

## 2016-08-27 LAB — BLOOD CULTURE ID PANEL (REFLEXED)

## 2016-08-28 ENCOUNTER — Emergency Department (HOSPITAL_BASED_OUTPATIENT_CLINIC_OR_DEPARTMENT_OTHER)
Admission: EM | Admit: 2016-08-28 | Discharge: 2016-08-28 | Disposition: A | Discharge status: Home / Self Care | Payer: BLUE CROSS/BLUE SHIELD | Attending: Emergency Medicine | Admitting: Emergency Medicine

## 2016-08-28 ENCOUNTER — Encounter (HOSPITAL_BASED_OUTPATIENT_CLINIC_OR_DEPARTMENT_OTHER): Payer: Self-pay | Admitting: Emergency Medicine

## 2016-08-28 DIAGNOSIS — E119 Type 2 diabetes mellitus without complications: Secondary | ICD-10-CM | POA: Insufficient documentation

## 2016-08-28 DIAGNOSIS — Z87891 Personal history of nicotine dependence: Secondary | ICD-10-CM | POA: Diagnosis not present

## 2016-08-28 DIAGNOSIS — Z794 Long term (current) use of insulin: Secondary | ICD-10-CM | POA: Insufficient documentation

## 2016-08-28 DIAGNOSIS — I10 Essential (primary) hypertension: Secondary | ICD-10-CM | POA: Diagnosis not present

## 2016-08-28 DIAGNOSIS — Z4801 Encounter for change or removal of surgical wound dressing: Secondary | ICD-10-CM | POA: Insufficient documentation

## 2016-08-28 DIAGNOSIS — L0291 Cutaneous abscess, unspecified: Secondary | ICD-10-CM

## 2016-08-28 DIAGNOSIS — Z5189 Encounter for other specified aftercare: Secondary | ICD-10-CM

## 2016-08-28 LAB — CBC WITH DIFFERENTIAL/PLATELET
Basophils Absolute: 0 10*3/uL (ref 0.0–0.1)
Basophils Relative: 0 %
Eosinophils Absolute: 0.1 10*3/uL (ref 0.0–0.7)
Eosinophils Relative: 1 %
HCT: 32.7 % — ABNORMAL LOW (ref 36.0–46.0)
Hemoglobin: 10.8 g/dL — ABNORMAL LOW (ref 12.0–15.0)
Lymphocytes Relative: 8 %
Lymphs Abs: 1.2 10*3/uL (ref 0.7–4.0)
MCH: 29 pg (ref 26.0–34.0)
MCHC: 33 g/dL (ref 30.0–36.0)
MCV: 87.7 fL (ref 78.0–100.0)
Monocytes Absolute: 1.5 10*3/uL — ABNORMAL HIGH (ref 0.1–1.0)
Monocytes Relative: 10 %
Neutro Abs: 11.6 10*3/uL — ABNORMAL HIGH (ref 1.7–7.7)
Neutrophils Relative %: 81 %
Platelets: 322 10*3/uL (ref 150–400)
RBC: 3.73 MIL/uL — ABNORMAL LOW (ref 3.87–5.11)
RDW: 12.3 % (ref 11.5–15.5)
WBC: 14.4 10*3/uL — ABNORMAL HIGH (ref 4.0–10.5)

## 2016-08-28 NOTE — ED Notes (Signed)
Pt verbalized understanding of discharge instructions and denies any further questions at this time.   

## 2016-08-28 NOTE — ED Triage Notes (Signed)
Pt here for recheck of right foot great toe. 12/19 area was drained. Currently dry with bruising. Denies Pain or Fevers. Taking antibiotics as prescribed.

## 2016-08-28 NOTE — ED Notes (Signed)
Pt now states she was told to come back to ER because the blood cultures drawn on 12/19 came back positive. EDP at bedside and will research.

## 2016-08-28 NOTE — ED Provider Notes (Signed)
[place] DEPT [facility] Provider Note   CSN: [id number] Arrival date & time: 08/28/16  0756     History   Chief Complaint Chief Complaint  Patient presents with  . Wound Check    HPI CHRYSA COZAD is a 50 y.o. female.  HPI Patient states that she was here to recheck her toe. 2 days ago she had a incision and drainage of infected left great toe. States that she is feeling better. States the swelling is improved. No fevers. States the redness is gone down. She's been taking her antibiotics. Patient's family member states that she was told to come in due to positive blood cultures. Patient was not going to tell us this. She does not want to come in the hospital. She is diabetic.   Past Medical History:  Diagnosis Date  . Acute bronchitis 07/08/2011  . COMMON MIGRAINE 10/07/2010  . CVA (cerebral infarction) 04/19/2013  . Depression 12/18/2012  . Diabetes mellitus type II, uncontrolled ([place]) 10/07/2010   Qualifier: Diagnosis of  By: Charlett Blake MD, [name]    . Diabetic foot infection ([place]) 08/26/2016  . Disturbance of skin sensation 10/07/2010  . Essential hypertension, benign 10/07/2010  . Heart murmur   . HEART MURMUR, HX OF 10/07/2010  . Hyperlipidemia 12/06/2010  . Hypertension   . NEPHROLITHIASIS, HX OF 10/07/2010  . Overweight(278.02) 12/06/2010  . PERIPHERAL NEUROPATHY, FEET 10/07/2010  . PVD (peripheral vascular disease) ([place]) 01/21/2012  . RESTLESS LEG SYNDROME 10/25/2010  . SOB (shortness of breath) 04/01/2012  . Stroke [facility]) 2014, 2017  . Weakness of right side of body 04/19/2013    Patient Active Problem List   Diagnosis Date Noted  . Diabetic foot infection ([place]) 08/26/2016  . Diabetic polyneuropathy associated with diabetes mellitus due to underlying condition ([place]) 05/07/2016  . Orthostatic hypotension 05/07/2016  . Cerebrovascular disease 04/27/2016  . Nausea with vomiting 04/01/2016  . ICH (intracerebral hemorrhage) ([place]) 03/30/2016  . Nonhealing nonsurgical wound  03/06/2016  . Visit for preventive health examination 05/21/2015  . Breast cancer screening 05/21/2015  . Arm lesion 07/02/2014  . Pain in joint, ankle and foot 04/28/2014  . Pain in limb 01/13/2014  . Neuropathic pain of both legs 01/13/2014  . Hip pain 01/12/2014  . Allergic rhinitis 03/10/2013  . Back pain 03/08/2013  . Depression 12/18/2012  . PVD (peripheral vascular disease) ([place]) 01/21/2012  . Diabetic ulcer of lower extremity ([place]) 12/22/2011  . Hyperlipidemia 12/06/2010  . Overweight(278.02) 12/06/2010  . RESTLESS LEG SYNDROME 10/25/2010  . Migraine without aura 10/07/2010  . Hereditary and idiopathic peripheral neuropathy 10/07/2010  . Essential hypertension, benign 10/07/2010  . DISTURBANCE OF SKIN SENSATION 10/07/2010  . HEART MURMUR, HX OF 10/07/2010  . NEPHROLITHIASIS, HX OF 10/07/2010    Past Surgical History:  Procedure Laterality Date  . None    . WISDOM TOOTH EXTRACTION      OB History    No data available       Home Medications    Prior to Admission medications   Medication Sig Start Date End Date Taking? Authorizing Provider  cephALEXin (KEFLEX) 500 MG capsule Take 1 capsule (500 mg total) by mouth 4 (four) times daily. 08/26/16  Yes Deno Etienne, DO  glucose blood (ONE TOUCH ULTRA TEST) test strip Use once daily to check blood sugar.  DX E11.9 01/04/16  Yes Mosie Lukes, MD  Insulin Pen Needle 32G X 4 MM MISC Use as directed for insulin.DX E11.65 03/03/16  Yes Mosie Lukes, MD  Lancets (FREESTYLE) lancets DX- 250.02  Pts machine is Freestyle Freedom Lite 04/20/13  Yes Mosie Lukes, MD  LANTUS SOLOSTAR 100 UNIT/ML Solostar Pen INJECT 34 UNITS IN AM & 32 UNITS IN PM. INCREASE BY 2 UNITS EVERY 3 DAYS AS LONG AS ABOVE 100 05/12/16  Yes Mosie Lukes, MD  lisinopril (PRINIVIL,ZESTRIL) 10 MG tablet TAKE 1 TABLET (10 MG TOTAL) BY MOUTH 2 (TWO) TIMES DAILY. 04/01/16  Yes Mosie Lukes, MD  metFORMIN (GLUCOPHAGE) 1000 MG tablet TAKE 1 TABLET TWICE DAILY AND  1/2 TABLET AT NOON 04/01/16  Yes Mosie Lukes, MD  simvastatin (ZOCOR) 5 MG tablet Take 1 tablet (5 mg total) by mouth daily. 05/19/16  Yes Mosie Lukes, MD  sulfamethoxazole-trimethoprim (BACTRIM DS,SEPTRA DS) 800-160 MG tablet Take 1 tablet by mouth 2 (two) times daily. 08/26/16 09/02/16 Yes Deno Etienne, DO  aspirin EC 81 MG tablet Take 81 mg by mouth daily.    Historical Provider, MD  cyclobenzaprine (FLEXERIL) 10 MG tablet Take 1 tablet (10 mg total) by mouth 3 (three) times daily as needed for muscle spasms. Patient not taking: Reported on 08/26/2016 05/14/16   Brunetta Jeans, PA-C  HYDROcodone-acetaminophen [facility]) 10-325 MG tablet Take 1 tablet by mouth every 8 (eight) hours as needed. Patient not taking: Reported on 08/26/2016 05/14/16   Brunetta Jeans, PA-C  Insulin Glargine (LANTUS SOLOSTAR) 100 UNIT/ML Solostar Pen Inject 50 Units into the skin 2 (two) times daily. 10/09/15   Mosie Lukes, MD  methocarbamol (ROBAXIN) 500 MG tablet Take 1 tablet (500 mg total) by mouth 2 (two) times daily. Patient not taking: Reported on 08/26/2016 05/17/16   Fransico Meadow, PA-C  metoCLOPramide (REGLAN) 10 MG tablet Take 1 tablet (10 mg total) by mouth every 8 (eight) hours as needed for nausea. 03/28/16   Malvin Johns, MD  oxyCODONE-acetaminophen (PERCOCET/ROXICET) 5-325 MG tablet Take 2 tablets by mouth every 4 (four) hours as needed for severe pain. 05/17/16   Fransico Meadow, PA-C    Family History Family History  Problem Relation Age of Onset  . Arthritis Mother   . Stroke Brother     previous smoker  . Alcohol abuse Brother     in remission  . Leukemia Brother   . Diabetes Paternal Grandmother   . Healthy Son     Social History Social History  Substance Use Topics  . Smoking status: Former Smoker    Packs/day: 0.50    Quit date: 09/08/2005  . Smokeless tobacco: Never Used  . Alcohol use No     Allergies   Lyrica [pregabalin]; Tramadol; and Morphine and related   Review of  Systems Review of Systems  Constitutional: Negative for appetite change.  HENT: Negative for congestion.   Respiratory: Negative for shortness of breath.   Cardiovascular: Negative for chest pain.  Gastrointestinal: Negative for abdominal pain.  Genitourinary: Negative for genital sores.  Musculoskeletal: Negative for back pain.  Skin: Positive for wound.  Neurological: Positive for numbness.     Physical Exam Updated Vital Signs BP 174/91 (BP Location: Left Arm)   Pulse 104   Temp 98.6 F (37 C) (Oral)   Resp 18   Ht 5\' 2"  (1.575 m)   Wt 145 lb (65.8 kg)   LMP 02/12/2011 (Exact Date)   SpO2 100%   BMI 26.52 kg/m   Physical Exam  Constitutional: She appears well-developed.  HENT:  Head: Atraumatic.  Neck: Neck supple.  Cardiovascular: Normal rate.  Pulmonary/Chest: Effort normal.  Abdominal: Soft.  Musculoskeletal:  Left great toe post IND. There is slight liquid in the area of the callus medially. Erythematous improved. Patient does not have much sensation.  Neurological: She is alert.  Skin: Skin is warm. Capillary refill takes less than 2 seconds.       ED Treatments / Results  Labs (all labs ordered are listed, but only abnormal results are displayed) Labs Reviewed  CBC WITH DIFFERENTIAL/PLATELET - Abnormal; Notable for the following:       Result Value   WBC 14.4 (*)    RBC 3.73 (*)    Hemoglobin 10.8 (*)    HCT 32.7 (*)    Neutro Abs 11.6 (*)    Monocytes Absolute 1.5 (*)    All other components within normal limits    EKG  EKG Interpretation None       Radiology Dg Chest 2 View  Result Date: 08/26/2016 CLINICAL DATA:  Congestion.  Fever. EXAM: CHEST  2 VIEW COMPARISON:  11/05/2002 report. FINDINGS: Mediastinum hilar structures normal. Mild cardiomegaly. Low lung volumes. No focal infiltrate. No pleural effusion or pneumothorax. IMPRESSION: 1.  Mild cardiomegaly.  2. Low lung volumes.  No focal infiltrate. Electronically Signed   By:  Marcello Moores  Register   On: 08/26/2016 17:19    Procedures Procedures (including critical care time)  Medications Ordered in ED Medications - No data to display   Initial Impression / Assessment and Plan / ED Course  I have reviewed the triage vital signs and the nursing notes.  Pertinent labs & imaging results that were available during my care of the patient were reviewed by me and considered in my medical decision making (see chart for details).  Clinical Course     Patient with improving drain abscess. Despite patient's attempt at trying hydrated she did have one anaerobic bottle limited has some gram variable rods. The white count systemically is decreased and the other cultures that were done on this did not show anything. Likely a contaminant especially since clinically the patient is doing somewhat better. Will discharge home and if patient worsens or more of the labs come back abnormal will need to return. Will discharge.  Final Clinical Impressions(s) / ED Diagnoses   Final diagnoses:  Abscess  Abscess re-check    New Prescriptions New Prescriptions   No medications on file     Davonna Belling, MD 08/28/16 [phone]

## 2016-08-28 NOTE — ED Notes (Signed)
ED Provider at bedside. 

## 2016-08-28 NOTE — ED Notes (Signed)
I & D tray at bedside if needed. 

## 2016-08-28 NOTE — Discharge Instructions (Signed)
Return for worsening of redness or swelling. Return for fevers. Return if you begin to feel worse.

## 2016-08-29 ENCOUNTER — Encounter (HOSPITAL_BASED_OUTPATIENT_CLINIC_OR_DEPARTMENT_OTHER): Payer: Self-pay | Admitting: Emergency Medicine

## 2016-08-29 ENCOUNTER — Emergency Department (HOSPITAL_BASED_OUTPATIENT_CLINIC_OR_DEPARTMENT_OTHER): Payer: BLUE CROSS/BLUE SHIELD

## 2016-08-29 ENCOUNTER — Emergency Department (HOSPITAL_BASED_OUTPATIENT_CLINIC_OR_DEPARTMENT_OTHER)
Admission: EM | Admit: 2016-08-29 | Discharge: 2016-08-29 | Disposition: A | Discharge status: Home / Self Care | Payer: BLUE CROSS/BLUE SHIELD | Attending: Emergency Medicine | Admitting: Emergency Medicine

## 2016-08-29 DIAGNOSIS — I10 Essential (primary) hypertension: Secondary | ICD-10-CM | POA: Insufficient documentation

## 2016-08-29 DIAGNOSIS — E119 Type 2 diabetes mellitus without complications: Secondary | ICD-10-CM | POA: Insufficient documentation

## 2016-08-29 DIAGNOSIS — Z87891 Personal history of nicotine dependence: Secondary | ICD-10-CM | POA: Diagnosis not present

## 2016-08-29 DIAGNOSIS — Z794 Long term (current) use of insulin: Secondary | ICD-10-CM | POA: Insufficient documentation

## 2016-08-29 DIAGNOSIS — Z4801 Encounter for change or removal of surgical wound dressing: Secondary | ICD-10-CM | POA: Insufficient documentation

## 2016-08-29 DIAGNOSIS — Z5189 Encounter for other specified aftercare: Secondary | ICD-10-CM

## 2016-08-29 DIAGNOSIS — Z7982 Long term (current) use of aspirin: Secondary | ICD-10-CM | POA: Diagnosis not present

## 2016-08-29 LAB — COMPREHENSIVE METABOLIC PANEL
ALT: 23 U/L (ref 14–54)
AST: 22 U/L (ref 15–41)
Albumin: 3.2 g/dL — ABNORMAL LOW (ref 3.5–5.0)
Alkaline Phosphatase: 89 U/L (ref 38–126)
Anion gap: 8 (ref 5–15)
BUN: 17 mg/dL (ref 6–20)
CO2: 25 mmol/L (ref 22–32)
Calcium: 8.8 mg/dL — ABNORMAL LOW (ref 8.9–10.3)
Chloride: 103 mmol/L (ref 101–111)
Creatinine, Ser: 0.62 mg/dL (ref 0.44–1.00)
GFR calc Af Amer: >60 mL/min (ref >60)
GFR calc non Af Amer: >60 mL/min (ref >60)
Glucose, Bld: 185 mg/dL — ABNORMAL HIGH (ref 65–99)
Potassium: 3.6 mmol/L (ref 3.5–5.1)
Sodium: 136 mmol/L (ref 135–145)
Total Bilirubin: 0.5 mg/dL (ref 0.3–1.2)
Total Protein: 7.2 g/dL (ref 6.5–8.1)

## 2016-08-29 LAB — CBC WITH DIFFERENTIAL/PLATELET
Basophils Absolute: 0 10*3/uL (ref 0.0–0.1)
Basophils Relative: 0 %
Eosinophils Absolute: 0.1 10*3/uL (ref 0.0–0.7)
Eosinophils Relative: 1 %
HCT: 30.7 % — ABNORMAL LOW (ref 36.0–46.0)
Hemoglobin: 10.3 g/dL — ABNORMAL LOW (ref 12.0–15.0)
Lymphocytes Relative: 14 %
Lymphs Abs: 1.6 10*3/uL (ref 0.7–4.0)
MCH: 29.3 pg (ref 26.0–34.0)
MCHC: 33.6 g/dL (ref 30.0–36.0)
MCV: 87.5 fL (ref 78.0–100.0)
Monocytes Absolute: 1.4 10*3/uL — ABNORMAL HIGH (ref 0.1–1.0)
Monocytes Relative: 13 %
Neutro Abs: 7.9 10*3/uL — ABNORMAL HIGH (ref 1.7–7.7)
Neutrophils Relative %: 72 %
Platelets: 374 10*3/uL (ref 150–400)
RBC: 3.51 MIL/uL — ABNORMAL LOW (ref 3.87–5.11)
RDW: 12.4 % (ref 11.5–15.5)
WBC: 11.1 10*3/uL — ABNORMAL HIGH (ref 4.0–10.5)

## 2016-08-29 LAB — CULTURE, BLOOD (ROUTINE X 2)

## 2016-08-29 LAB — CBG MONITORING, ED: Glucose-Capillary: 150 mg/dL — ABNORMAL HIGH (ref 65–99)

## 2016-08-29 LAB — I-STAT CG4 LACTIC ACID, ED: Lactic Acid, Venous: 0.88 mmol/L (ref 0.5–1.9)

## 2016-08-29 MED ORDER — CLINDAMYCIN PHOSPHATE 600 MG/50ML IV SOLN
600.0000 mg | Freq: Once | INTRAVENOUS | Status: AC
  Administered 2016-08-29: 600 mg via INTRAVENOUS
  Filled 2016-08-29: qty 50

## 2016-08-29 MED ORDER — CLINDAMYCIN HCL 150 MG PO CAPS: 300.0000 mg | ORAL_CAPSULE | Freq: Three times a day (TID) | ORAL | 0 refills | Status: DC

## 2016-08-29 MED FILL — CLINDAMYCIN HCL 150 MG CAPS: 150 | 10 days supply | Qty: 60 | Fill #0

## 2016-08-29 NOTE — ED Triage Notes (Signed)
Pt here for wound check.  Pt wore work boots and wound became more red and foot swollen.  Redness and swelling has improved since removing the boot.  Pt currently getting treated with PO antibiotics.

## 2016-08-29 NOTE — Discharge Instructions (Signed)
Take your antibiotic as prescribed until completed. I recommend returning to the ED in 2 days for recheck of your symptoms have not improved or worsened. Follow-up with your primary care provider at your scheduled appointment next week. Please return to the Emergency Department if symptoms worsen or new onset of fever, chills, abdominal pain, vomiting, worsening redness or swelling, drainage, numbness, tingling, weakness, decreased range of motion, worsening/new red streaking up her leg.

## 2016-08-29 NOTE — ED Notes (Addendum)
Pt's wound marked with skin marker prior to d/c. Verbalizes understanding to stop other antibiotics and only take clindamycin. Also to f/u with PCP next week for a recheck. Work note given.

## 2016-08-29 NOTE — ED Provider Notes (Signed)
[place] DEPT [facility] Provider Note   CSN: [id number] Arrival date & time: 08/29/16  [id number]     History   Chief Complaint Chief Complaint  Patient presents with  . Wound Check    HPI [name] is a 50 y.o. female.  HPI   Pt is a 50 yo female with PMH of DM, HTN, PVD and CVA Who presents to the ED with complaint of wound check. Patient reports she was initially seen on 12/19 for left great toe infection. She reports having an I&D performed at that time, blood work obtained and was started on antibiotics. Patient reports she has been taking her prescription of Keflex and Bactrim as prescribed. Patient reports returning to the ED on 12/21 for wound recheck. She was advised that she had a positive blood culture. Patient reports overall she has had improvement of her swelling and redness. However she reports yesterday after wearing her diabetic compression socks instilled toe boots at work she noticed last night her foot was more swollen and red which she states has significantly improved today. She reports noticing a small area of red streaking up the front shin this morning which she states was not present yesterday. Denies fever, chills, abdominal pain, nausea, vomiting, drainage, numbness, tingling, weakness.  Past Medical History:  Diagnosis Date  . Acute bronchitis 07/08/2011  . COMMON MIGRAINE 10/07/2010  . CVA (cerebral infarction) 04/19/2013  . Depression 12/18/2012  . Diabetes mellitus type II, uncontrolled ([place]) 10/07/2010   Qualifier: Diagnosis of  By: Charlett Blake MD, [name]    . Diabetic foot infection ([place]) 08/26/2016  . Disturbance of skin sensation 10/07/2010  . Essential hypertension, benign 10/07/2010  . Heart murmur   . HEART MURMUR, HX OF 10/07/2010  . Hyperlipidemia 12/06/2010  . Hypertension   . NEPHROLITHIASIS, HX OF 10/07/2010  . Overweight(278.02) 12/06/2010  . PERIPHERAL NEUROPATHY, FEET 10/07/2010  . PVD (peripheral vascular disease) ([place]) 01/21/2012  . RESTLESS  LEG SYNDROME 10/25/2010  . SOB (shortness of breath) 04/01/2012  . Stroke [facility]) 2014, 2017  . Weakness of right side of body 04/19/2013    Patient Active Problem List   Diagnosis Date Noted  . Diabetic foot infection ([place]) 08/26/2016  . Diabetic polyneuropathy associated with diabetes mellitus due to underlying condition ([place]) 05/07/2016  . Orthostatic hypotension 05/07/2016  . Cerebrovascular disease 04/27/2016  . Nausea with vomiting 04/01/2016  . ICH (intracerebral hemorrhage) ([place]) 03/30/2016  . Nonhealing nonsurgical wound 03/06/2016  . Visit for preventive health examination 05/21/2015  . Breast cancer screening 05/21/2015  . Arm lesion 07/02/2014  . Pain in joint, ankle and foot 04/28/2014  . Pain in limb 01/13/2014  . Neuropathic pain of both legs 01/13/2014  . Hip pain 01/12/2014  . Allergic rhinitis 03/10/2013  . Back pain 03/08/2013  . Depression 12/18/2012  . PVD (peripheral vascular disease) ([place]) 01/21/2012  . Diabetic ulcer of lower extremity ([place]) 12/22/2011  . Hyperlipidemia 12/06/2010  . Overweight(278.02) 12/06/2010  . RESTLESS LEG SYNDROME 10/25/2010  . Migraine without aura 10/07/2010  . Hereditary and idiopathic peripheral neuropathy 10/07/2010  . Essential hypertension, benign 10/07/2010  . DISTURBANCE OF SKIN SENSATION 10/07/2010  . HEART MURMUR, HX OF 10/07/2010  . NEPHROLITHIASIS, HX OF 10/07/2010    Past Surgical History:  Procedure Laterality Date  . None    . WISDOM TOOTH EXTRACTION      OB History    No data available       Home Medications    Prior to Admission  medications   Medication Sig Start Date End Date Taking? Authorizing Provider  aspirin EC 81 MG tablet Take 81 mg by mouth daily.    Historical Provider, MD  cephALEXin (KEFLEX) 500 MG capsule Take 1 capsule (500 mg total) by mouth 4 (four) times daily. 08/26/16   Deno Etienne, DO  clindamycin (CLEOCIN) 150 MG capsule Take 2 capsules (300 mg total) by mouth 3 (three) times daily.  May dispense as 150mg  capsules 08/29/16   Nona Dell, PA-C  cyclobenzaprine (FLEXERIL) 10 MG tablet Take 1 tablet (10 mg total) by mouth 3 (three) times daily as needed for muscle spasms. Patient not taking: Reported on 08/26/2016 05/14/16   Brunetta Jeans, PA-C  glucose blood (ONE TOUCH ULTRA TEST) test strip Use once daily to check blood sugar.  DX E11.9 01/04/16   Mosie Lukes, MD  HYDROcodone-acetaminophen (NORCO) 10-325 MG tablet Take 1 tablet by mouth every 8 (eight) hours as needed. Patient not taking: Reported on 08/26/2016 05/14/16   Brunetta Jeans, PA-C  Insulin Glargine (LANTUS SOLOSTAR) 100 UNIT/ML Solostar Pen Inject 50 Units into the skin 2 (two) times daily. 10/09/15   Mosie Lukes, MD  Insulin Pen Needle 32G X 4 MM MISC Use as directed for insulin.DX E11.65 03/03/16   Mosie Lukes, MD  Lancets (FREESTYLE) lancets DX- 250.02  Pts machine is Freestyle Freedom Lite 04/20/13   Mosie Lukes, MD  LANTUS SOLOSTAR 100 UNIT/ML Solostar Pen INJECT 34 UNITS IN AM & 32 UNITS IN PM. INCREASE BY 2 UNITS EVERY 3 DAYS AS LONG AS ABOVE 100 05/12/16   Mosie Lukes, MD  lisinopril (PRINIVIL,ZESTRIL) 10 MG tablet TAKE 1 TABLET (10 MG TOTAL) BY MOUTH 2 (TWO) TIMES DAILY. 04/01/16   Mosie Lukes, MD  metFORMIN (GLUCOPHAGE) 1000 MG tablet TAKE 1 TABLET TWICE DAILY AND 1/2 TABLET AT NOON 04/01/16   Mosie Lukes, MD  methocarbamol (ROBAXIN) 500 MG tablet Take 1 tablet (500 mg total) by mouth 2 (two) times daily. Patient not taking: Reported on 08/26/2016 05/17/16   Fransico Meadow, PA-C  metoCLOPramide (REGLAN) 10 MG tablet Take 1 tablet (10 mg total) by mouth every 8 (eight) hours as needed for nausea. 03/28/16   Malvin Johns, MD  oxyCODONE-acetaminophen (PERCOCET/ROXICET) 5-325 MG tablet Take 2 tablets by mouth every 4 (four) hours as needed for severe pain. 05/17/16   Fransico Meadow, PA-C  simvastatin (ZOCOR) 5 MG tablet Take 1 tablet (5 mg total) by mouth daily. 05/19/16   Mosie Lukes,  MD  sulfamethoxazole-trimethoprim (BACTRIM DS,SEPTRA DS) 800-160 MG tablet Take 1 tablet by mouth 2 (two) times daily. 08/26/16 09/02/16  Deno Etienne, DO    Family History Family History  Problem Relation Age of Onset  . Arthritis Mother   . Stroke Brother     previous smoker  . Alcohol abuse Brother     in remission  . Leukemia Brother   . Diabetes Paternal Grandmother   . Healthy Son     Social History Social History  Substance Use Topics  . Smoking status: Former Smoker    Packs/day: 0.50    Quit date: 09/08/2005  . Smokeless tobacco: Never Used  . Alcohol use No     Allergies   Lyrica [pregabalin]; Tramadol; and Morphine and related   Review of Systems Review of Systems  Skin: Positive for color change (redness) and wound.  All other systems reviewed and are negative.    Physical Exam Updated  Vital Signs BP 146/88   Pulse 92   Temp 98.7 F (37.1 C) (Oral)   Resp 16   Ht 5\' 2"  (1.575 m)   Wt 65.8 kg   LMP 02/12/2011 (Exact Date)   SpO2 99%   BMI 26.52 kg/m   Physical Exam  Constitutional: She is oriented to person, place, and time. She appears well-developed and well-nourished. No distress.  HENT:  Head: Normocephalic and atraumatic.  Eyes: Conjunctivae and EOM are normal. Right eye exhibits no discharge. Left eye exhibits no discharge. No scleral icterus.  Neck: Normal range of motion. Neck supple.  Cardiovascular: Normal rate and intact distal pulses.   Pulmonary/Chest: Effort normal. No respiratory distress.  Abdominal: Soft. She exhibits no distension.  Musculoskeletal: Normal range of motion. She exhibits no edema, tenderness or deformity.  FROM of left toes, foot, ankle and knee with 5/5 strength. Mild swelling noted to left great toe. No calf swelling noted. 2+ DP pulse. Sensation grossly intact.   Neurological: She is alert and oriented to person, place, and time.  Skin: Skin is warm and dry. Capillary refill takes less than 2 seconds. She is  not diaphoretic.  Erythema and warmth noted to dorsal aspect of left great toe that extends to distal forefoot with small thin area of red streaking that extends up anterior aspect to mid/lower shin.  Nursing note and vitals reviewed.         ED Treatments / Results  Labs (all labs ordered are listed, but only abnormal results are displayed) Labs Reviewed  CBC WITH DIFFERENTIAL/PLATELET - Abnormal; Notable for the following:       Result Value   WBC 11.1 (*)    RBC 3.51 (*)    Hemoglobin 10.3 (*)    HCT 30.7 (*)    Neutro Abs 7.9 (*)    Monocytes Absolute 1.4 (*)    All other components within normal limits  COMPREHENSIVE METABOLIC PANEL - Abnormal; Notable for the following:    Glucose, Bld 185 (*)    Calcium 8.8 (*)    Albumin 3.2 (*)    All other components within normal limits  CBG MONITORING, ED - Abnormal; Notable for the following:    Glucose-Capillary 150 (*)    All other components within normal limits  CULTURE, BLOOD (ROUTINE X 2)  CULTURE, BLOOD (ROUTINE X 2)  I-STAT CG4 LACTIC ACID, ED    EKG  EKG Interpretation None       Radiology Dg Foot Complete Left  Result Date: 08/29/2016 CLINICAL DATA:  Cut on the bottom of the big toe with swelling and redness. EXAM: LEFT FOOT - COMPLETE 3+ VIEW COMPARISON:  None. FINDINGS: There is soft tissue swelling involving the great toe. There is no evidence for a fracture. No evidence for cortical destruction or periosteal reaction. IMPRESSION: No acute bone abnormality involving the left foot. Soft tissue swelling involving the great toe. Electronically Signed   By: Markus Daft M.D.   On: 08/29/2016 12:07    Procedures Procedures (including critical care time)  Medications Ordered in ED Medications  clindamycin (CLEOCIN) IVPB 600 mg (0 mg Intravenous Stopped 08/29/16 1320)     Initial Impression / Assessment and Plan / ED Course  I have reviewed the triage vital signs and the nursing notes.  Pertinent labs &  imaging results that were available during my care of the patient were reviewed by me and considered in my medical decision making (see chart for details).  Clinical Course  Patient presents for wound recheck. She reports initially been seen in the ED on 12/19 for her left great toe infection, I&D was performed, blood work was obtained and patient was discharged home on Keflex and Bactrim which she reports taking as prescribed. She reports returning to the ED for recheck in 2 days after being notified about a positive blood culture. VSS. Exam revealed mild swelling, erythema and warmth noted to left great toe with small area of red streaking noted up left anterior shin. Left foot neurovascular intact.  Chart review shows pt's initial ED visit on 12/19 showed WBC 19.4, right forearm blood culture positive for bacillus species with gram variable rods in anaerobic bottle only; remaining cultures negative.  Repeat labs done yesterday showed WBC improved to 14. Positive blood culture thought to be due to contamination. Discussed case with Dr. Darl Householder who evaluated the pt. Plan to order labs, recheck blood cultures and order foot xray due to pt with continued erythema and warmth noted on exam.  WBC 11, improved from 14 yesterday. Lactic 0.88. Blood cultures pending. Left foot x-ray revealed soft tissue swelling of the great toe, no acute bony abnormality. Consulted ID regarding abnormal initial blood culture of right forearm performed 3 days ago; advised to contact micro for further clarification. Micro lab advised that it is common for bacillus species to stain gram variable and state that bc only one culture was positive results were thought to be due to contamination. Plan to give patient IV dose of the mycin in the ED and discharged home on [place] for broader coverage due to pt with continued redness. Area marked in the ED. Discussed results and plan for discharge with patient. Advised patient to return in 2  days for recheck if symptoms have not improved or worsened. Discussed return precautions.   Final Clinical Impressions(s) / ED Diagnoses   Final diagnoses:  Visit for wound check    New Prescriptions New Prescriptions   CLINDAMYCIN (CLEOCIN) 150 MG CAPSULE    Take 2 capsules (300 mg total) by mouth 3 (three) times daily. May dispense as 150mg  capsules     Nona Dell, [place] 08/29/16 [place] Yao, MD 08/30/16 0900

## 2016-08-30 ENCOUNTER — Telehealth: Payer: Self-pay

## 2016-08-30 NOTE — Telephone Encounter (Signed)
Post ED Visit - Positive Culture Follow-up  Culture report reviewed by antimicrobial stewardship pharmacist:  []  Elenor Quinones, Pharm.D. []  Heide Guile, Pharm.D., BCPS []  Parks Neptune, Pharm.D. []  Alycia Rossetti, Pharm.D., BCPS []  [place], Pharm.D., BCPS, AAHIVP []  Legrand Como, Pharm.D., BCPS, AAHIVP []  Milus Glazier, Pharm.D. []  Stephens November, Pharm.D. Arrie Senate Pharm D Positive urine culture Treated with Sulfamethoxazole-Trimethoprim, organism sensitive to the same and no further patient follow-up is required at this time.  Genia Del 08/30/2016, 3:22 PM

## 2016-08-31 LAB — CULTURE, BLOOD (ROUTINE X 2): Culture: NO GROWTH

## 2016-09-02 ENCOUNTER — Ambulatory Visit (HOSPITAL_BASED_OUTPATIENT_CLINIC_OR_DEPARTMENT_OTHER)
Admission: RE | Admit: 2016-09-02 | Discharge: 2016-09-02 | Disposition: A | Discharge status: Home / Self Care | Payer: BLUE CROSS/BLUE SHIELD | Source: Ambulatory Visit | Attending: Family Medicine | Admitting: Family Medicine

## 2016-09-02 ENCOUNTER — Other Ambulatory Visit: Payer: Self-pay | Admitting: Internal Medicine

## 2016-09-02 ENCOUNTER — Ambulatory Visit (INDEPENDENT_AMBULATORY_CARE_PROVIDER_SITE_OTHER): Payer: BLUE CROSS/BLUE SHIELD | Admitting: Family Medicine

## 2016-09-02 ENCOUNTER — Encounter: Payer: Self-pay | Admitting: Family Medicine

## 2016-09-02 VITALS — BP 142/76 | HR 100 | Temp 99.0°F | Resp 16 | Ht 62.0 in | Wt 146.4 lb

## 2016-09-02 DIAGNOSIS — L02612 Cutaneous abscess of left foot: Secondary | ICD-10-CM | POA: Diagnosis not present

## 2016-09-02 DIAGNOSIS — M79662 Pain in left lower leg: Secondary | ICD-10-CM

## 2016-09-02 DIAGNOSIS — E1169 Type 2 diabetes mellitus with other specified complication: Secondary | ICD-10-CM | POA: Diagnosis not present

## 2016-09-02 DIAGNOSIS — L03818 Cellulitis of other sites: Secondary | ICD-10-CM | POA: Diagnosis not present

## 2016-09-02 MED ORDER — CEFTRIAXONE SODIUM 1 G IJ SOLR
1.0000 g | Freq: Once | INTRAMUSCULAR | Status: AC
  Administered 2016-09-02: 1 g via INTRAMUSCULAR

## 2016-09-02 NOTE — Patient Instructions (Signed)

## 2016-09-02 NOTE — Progress Notes (Signed)
[name], female    DOB: 12-04-1965  Age: 50 y.o. MRN: [id number]    Subjective:  Subjective  HPI [name] presents for check L toe infection with cellulitis-- pt has been seen in er x2 and here last week.  Treated with abx--- redness has improved but looks like pockets of pus building up in toe.   xrays done last week -- no osteo   Review of Systems  Constitutional: Positive for fatigue. Negative for activity change, appetite change and unexpected weight change.  Respiratory: Negative for cough and shortness of breath.   Cardiovascular: Negative for chest pain and palpitations.  Psychiatric/Behavioral: Negative for behavioral problems and dysphoric mood. The patient is not nervous/anxious.     History Past Medical History:  Diagnosis Date  . Acute bronchitis 07/08/2011  . COMMON MIGRAINE 10/07/2010  . CVA (cerebral infarction) 04/19/2013  . Depression 12/18/2012  . Diabetes mellitus type II, uncontrolled ([place]) 10/07/2010   Qualifier: Diagnosis of  By: Charlett Blake MD, [name]    . Diabetic foot infection ([place]) 08/26/2016  . Disturbance of skin sensation 10/07/2010  . Essential hypertension, benign 10/07/2010  . Heart murmur   . HEART MURMUR, HX OF 10/07/2010  . Hyperlipidemia 12/06/2010  . Hypertension   . NEPHROLITHIASIS, HX OF 10/07/2010  . Overweight(278.02) 12/06/2010  . PERIPHERAL NEUROPATHY, FEET 10/07/2010  . PVD (peripheral vascular disease) ([place]) 01/21/2012  . RESTLESS LEG SYNDROME 10/25/2010  . SOB (shortness of breath) 04/01/2012  . Stroke [facility]) 2014, 2017  . Weakness of right side of body 04/19/2013    She has a past surgical history that includes None and Wisdom tooth extraction.   Her family history includes Alcohol abuse in her brother; Arthritis in her mother; Diabetes in her paternal grandmother; Healthy in her son; Leukemia in her brother; Stroke in her brother.She reports that she quit smoking about 10 years ago. She smoked 0.50 packs per day.  She has never used smokeless tobacco. She reports that she does not drink alcohol or use drugs.  Current Outpatient Prescriptions on File Prior to Visit  Medication Sig Dispense Refill  . aspirin EC 81 MG tablet Take 81 mg by mouth daily.    . clindamycin (CLEOCIN) 150 MG capsule Take 2 capsules (300 mg total) by mouth 3 (three) times daily. May dispense as 150mg  capsules 60 capsule 0  . cyclobenzaprine (FLEXERIL) 10 MG tablet Take 1 tablet (10 mg total) by mouth 3 (three) times daily as needed for muscle spasms. 30 tablet 0  . glucose blood (ONE TOUCH ULTRA TEST) test strip Use once daily to check blood sugar.  DX E11.9 100 each 11  . HYDROcodone-acetaminophen (NORCO) 10-325 MG tablet Take 1 tablet by mouth every 8 (eight) hours as needed. 15 tablet 0  . Insulin Glargine (LANTUS SOLOSTAR) 100 UNIT/ML Solostar Pen Inject 50 Units into the skin 2 (two) times daily. 15 mL 4  . Insulin Pen Needle 32G X 4 MM MISC Use as directed for insulin.DX E11.65 100 each 6  . Lancets (FREESTYLE) lancets DX- 250.02  Pts machine is Freestyle Freedom Lite 100 each 5  . LANTUS SOLOSTAR 100 UNIT/ML Solostar Pen INJECT 34 UNITS IN AM & 32 UNITS IN PM. INCREASE BY 2 UNITS EVERY 3 DAYS AS LONG AS ABOVE 100 15 pen 4  . lisinopril (PRINIVIL,ZESTRIL) 10 MG tablet TAKE 1 TABLET (10 MG TOTAL) BY MOUTH 2 (TWO) TIMES DAILY. 60 tablet 6  . metFORMIN (GLUCOPHAGE) 1000 MG tablet TAKE  1 TABLET TWICE DAILY AND 1/2 TABLET AT NOON 75 tablet 1  . methocarbamol (ROBAXIN) 500 MG tablet Take 1 tablet (500 mg total) by mouth 2 (two) times daily. 20 tablet 0  . metoCLOPramide (REGLAN) 10 MG tablet Take 1 tablet (10 mg total) by mouth every 8 (eight) hours as needed for nausea. 15 tablet 0  . oxyCODONE-acetaminophen (PERCOCET/ROXICET) 5-325 MG tablet Take 2 tablets by mouth every 4 (four) hours as needed for severe pain. 15 tablet 0  . simvastatin (ZOCOR) 5 MG tablet Take 1 tablet (5 mg total) by mouth daily. 30 tablet 1  .  sulfamethoxazole-trimethoprim (BACTRIM DS,SEPTRA DS) 800-160 MG tablet Take 1 tablet by mouth 2 (two) times daily. 14 tablet 0  . cephALEXin (KEFLEX) 500 MG capsule Take 1 capsule (500 mg total) by mouth 4 (four) times daily. (Patient not taking: Reported on 09/02/2016) 40 capsule 0   No current facility-administered medications on file prior to visit.      Objective:  Objective  Physical Exam  Constitutional: She is oriented to person, place, and time. She appears well-developed and well-nourished.  HENT:  Head: Normocephalic and atraumatic.  Eyes: Conjunctivae and EOM are normal.  Neck: Normal range of motion. Neck supple. No JVD present. Carotid bruit is not present. No thyromegaly present.  Cardiovascular: Normal rate, regular rhythm and normal heart sounds.   No murmur heard. Pulmonary/Chest: Effort normal and breath sounds normal. No respiratory distress. She has no wheezes. She has no rales. She exhibits no tenderness.  Musculoskeletal: She exhibits edema and tenderness.       Left lower leg: She exhibits tenderness and swelling.       Feet:  Neurological: She is alert and oriented to person, place, and time.  Skin: There is erythema.  + calf pain With some swelling  errythema is much better per pt --- outline from er still there  Psychiatric: She has a normal mood and affect.  Nursing note and vitals reviewed.  BP (!) 142/76 (BP Location: Left Arm, Patient Position: Sitting, Cuff Size: Normal)   Pulse 100   Temp 99 F (37.2 C) (Oral)   Resp 16   Ht 5\' 2"  (1.575 m)   Wt 146 lb 6.4 oz (66.4 kg)   LMP 02/12/2011 (Exact Date)   SpO2 97%   BMI 26.78 kg/m  Wt Readings from Last 3 Encounters:  09/02/16 146 lb 6.4 oz (66.4 kg)  08/29/16 145 lb (65.8 kg)  08/28/16 145 lb (65.8 kg)     Lab Results  Component Value Date   WBC 11.1 (H) 08/29/2016   HGB 10.3 (L) 08/29/2016   HCT 30.7 (L) 08/29/2016   PLT 374 08/29/2016   GLUCOSE 185 (H) 08/29/2016   CHOL 175  03/30/2016   TRIG 70 03/30/2016   HDL 38 (L) 03/30/2016   LDLDIRECT 136.4 04/01/2012   LDLCALC 123 (H) 03/30/2016   ALT 23 08/29/2016   AST 22 08/29/2016   NA 136 08/29/2016   K 3.6 08/29/2016   CL 103 08/29/2016   CREATININE 0.62 08/29/2016   BUN 17 08/29/2016   CO2 25 08/29/2016   TSH 2.15 11/20/2015   INR 1.07 03/30/2016   HGBA1C 12.6 (H) 03/30/2016   MICROALBUR 1.1 12/22/2011    Dg Foot Complete Left  Result Date: 08/29/2016 CLINICAL DATA:  Cut on the bottom of the big toe with swelling and redness. EXAM: LEFT FOOT - COMPLETE 3+ VIEW COMPARISON:  None. FINDINGS: There is soft tissue swelling involving the  great toe. There is no evidence for a fracture. No evidence for cortical destruction or periosteal reaction. IMPRESSION: No acute bone abnormality involving the left foot. Soft tissue swelling involving the great toe. Electronically Signed   By: Markus Daft M.D.   On: 08/29/2016 12:07     Assessment & Plan:  Plan  I am having Ms. Satcher maintain her freestyle, Insulin Glargine, glucose blood, Insulin Pen Needle, metoCLOPramide, lisinopril, metFORMIN, aspirin EC, LANTUS SOLOSTAR, cyclobenzaprine, HYDROcodone-acetaminophen, oxyCODONE-acetaminophen, methocarbamol, simvastatin, cephALEXin, sulfamethoxazole-trimethoprim, and clindamycin.  No orders of the defined types were placed in this encounter.   Problem List Items Addressed This Visit    None    Visit Diagnoses    Toe abscess, left    -  Primary   Relevant Orders   Ambulatory referral to Orthopedic Surgery   Pain of left calf       Relevant Orders   US Venous Img Lower Unilateral Left    con't abx po-- rocephin 2 g given today Pt to see ortho Doppler today --- if pain / pus/ swelling inc-- go to er  Follow-up: No Follow-up on file.  Ann Held, DO

## 2016-09-03 ENCOUNTER — Emergency Department (HOSPITAL_COMMUNITY): Payer: BLUE CROSS/BLUE SHIELD

## 2016-09-03 ENCOUNTER — Telehealth: Payer: Self-pay

## 2016-09-03 ENCOUNTER — Inpatient Hospital Stay (HOSPITAL_COMMUNITY)
Admission: EM | Admit: 2016-09-03 | Discharge: 2016-09-06 | DRG: 638 | Disposition: A | Discharge status: Home / Self Care | Payer: BLUE CROSS/BLUE SHIELD | Attending: Internal Medicine | Admitting: Internal Medicine

## 2016-09-03 ENCOUNTER — Encounter (HOSPITAL_COMMUNITY): Payer: Self-pay | Admitting: Emergency Medicine

## 2016-09-03 DIAGNOSIS — Z87891 Personal history of nicotine dependence: Secondary | ICD-10-CM

## 2016-09-03 DIAGNOSIS — E1169 Type 2 diabetes mellitus with other specified complication: Principal | ICD-10-CM | POA: Diagnosis present

## 2016-09-03 DIAGNOSIS — B370 Candidal stomatitis: Secondary | ICD-10-CM | POA: Diagnosis present

## 2016-09-03 DIAGNOSIS — E1142 Type 2 diabetes mellitus with diabetic polyneuropathy: Secondary | ICD-10-CM | POA: Diagnosis present

## 2016-09-03 DIAGNOSIS — G2581 Restless legs syndrome: Secondary | ICD-10-CM | POA: Diagnosis present

## 2016-09-03 DIAGNOSIS — E1165 Type 2 diabetes mellitus with hyperglycemia: Secondary | ICD-10-CM | POA: Diagnosis present

## 2016-09-03 DIAGNOSIS — L089 Local infection of the skin and subcutaneous tissue, unspecified: Secondary | ICD-10-CM

## 2016-09-03 DIAGNOSIS — Z794 Long term (current) use of insulin: Secondary | ICD-10-CM

## 2016-09-03 DIAGNOSIS — L03818 Cellulitis of other sites: Secondary | ICD-10-CM

## 2016-09-03 DIAGNOSIS — D638 Anemia in other chronic diseases classified elsewhere: Secondary | ICD-10-CM | POA: Diagnosis present

## 2016-09-03 DIAGNOSIS — I679 Cerebrovascular disease, unspecified: Secondary | ICD-10-CM | POA: Diagnosis present

## 2016-09-03 DIAGNOSIS — L0390 Cellulitis, unspecified: Secondary | ICD-10-CM | POA: Diagnosis present

## 2016-09-03 DIAGNOSIS — I10 Essential (primary) hypertension: Secondary | ICD-10-CM | POA: Diagnosis present

## 2016-09-03 DIAGNOSIS — E11628 Type 2 diabetes mellitus with other skin complications: Secondary | ICD-10-CM | POA: Diagnosis present

## 2016-09-03 DIAGNOSIS — Z7982 Long term (current) use of aspirin: Secondary | ICD-10-CM

## 2016-09-03 DIAGNOSIS — M86172 Other acute osteomyelitis, left ankle and foot: Secondary | ICD-10-CM | POA: Diagnosis present

## 2016-09-03 DIAGNOSIS — E11649 Type 2 diabetes mellitus with hypoglycemia without coma: Secondary | ICD-10-CM | POA: Diagnosis present

## 2016-09-03 DIAGNOSIS — E43 Unspecified severe protein-calorie malnutrition: Secondary | ICD-10-CM | POA: Diagnosis present

## 2016-09-03 DIAGNOSIS — Z6826 Body mass index (BMI) 26.0-26.9, adult: Secondary | ICD-10-CM

## 2016-09-03 DIAGNOSIS — E785 Hyperlipidemia, unspecified: Secondary | ICD-10-CM | POA: Diagnosis present

## 2016-09-03 DIAGNOSIS — Z823 Family history of stroke: Secondary | ICD-10-CM

## 2016-09-03 DIAGNOSIS — Z8673 Personal history of transient ischemic attack (TIA), and cerebral infarction without residual deficits: Secondary | ICD-10-CM

## 2016-09-03 DIAGNOSIS — L03116 Cellulitis of left lower limb: Secondary | ICD-10-CM | POA: Diagnosis present

## 2016-09-03 DIAGNOSIS — Z811 Family history of alcohol abuse and dependence: Secondary | ICD-10-CM

## 2016-09-03 DIAGNOSIS — E0842 Diabetes mellitus due to underlying condition with diabetic polyneuropathy: Secondary | ICD-10-CM | POA: Diagnosis not present

## 2016-09-03 DIAGNOSIS — Z888 Allergy status to other drugs, medicaments and biological substances status: Secondary | ICD-10-CM

## 2016-09-03 DIAGNOSIS — G43009 Migraine without aura, not intractable, without status migrainosus: Secondary | ICD-10-CM | POA: Diagnosis present

## 2016-09-03 DIAGNOSIS — Z885 Allergy status to narcotic agent status: Secondary | ICD-10-CM

## 2016-09-03 DIAGNOSIS — Z806 Family history of leukemia: Secondary | ICD-10-CM

## 2016-09-03 DIAGNOSIS — E440 Moderate protein-calorie malnutrition: Secondary | ICD-10-CM

## 2016-09-03 DIAGNOSIS — Z8261 Family history of arthritis: Secondary | ICD-10-CM

## 2016-09-03 DIAGNOSIS — Z833 Family history of diabetes mellitus: Secondary | ICD-10-CM

## 2016-09-03 DIAGNOSIS — E1151 Type 2 diabetes mellitus with diabetic peripheral angiopathy without gangrene: Secondary | ICD-10-CM | POA: Diagnosis present

## 2016-09-03 LAB — URINALYSIS, ROUTINE W REFLEX MICROSCOPIC
Bacteria, UA: NONE SEEN
Bilirubin Urine: NEGATIVE
Glucose, UA: NEGATIVE mg/dL
Ketones, ur: NEGATIVE mg/dL
Nitrite: NEGATIVE
Protein, ur: 100 mg/dL — AB
Specific Gravity, Urine: 1.018 (ref 1.005–1.030)
pH: 5 (ref 5.0–8.0)

## 2016-09-03 LAB — COMPREHENSIVE METABOLIC PANEL
ALT: 17 U/L (ref 14–54)
AST: 15 U/L (ref 15–41)
Albumin: 3 g/dL — ABNORMAL LOW (ref 3.5–5.0)
Alkaline Phosphatase: 84 U/L (ref 38–126)
Anion gap: 9 (ref 5–15)
BUN: 14 mg/dL (ref 6–20)
CO2: 27 mmol/L (ref 22–32)
Calcium: 9.3 mg/dL (ref 8.9–10.3)
Chloride: 101 mmol/L (ref 101–111)
Creatinine, Ser: 0.61 mg/dL (ref 0.44–1.00)
GFR calc Af Amer: >60 mL/min (ref >60)
GFR calc non Af Amer: >60 mL/min (ref >60)
Glucose, Bld: 272 mg/dL — ABNORMAL HIGH (ref 65–99)
Potassium: 3.7 mmol/L (ref 3.5–5.1)
Sodium: 137 mmol/L (ref 135–145)
Total Bilirubin: 0.3 mg/dL (ref 0.3–1.2)
Total Protein: 7.4 g/dL (ref 6.5–8.1)

## 2016-09-03 LAB — CBC WITH DIFFERENTIAL/PLATELET
Basophils Absolute: 0.1 10*3/uL (ref 0.0–0.1)
Basophils Relative: 1 %
Eosinophils Absolute: 0.1 10*3/uL (ref 0.0–0.7)
Eosinophils Relative: 2 %
HCT: 29.7 % — ABNORMAL LOW (ref 36.0–46.0)
Hemoglobin: 10.1 g/dL — ABNORMAL LOW (ref 12.0–15.0)
Lymphocytes Relative: 21 %
Lymphs Abs: 1.9 10*3/uL (ref 0.7–4.0)
MCH: 29.2 pg (ref 26.0–34.0)
MCHC: 34 g/dL (ref 30.0–36.0)
MCV: 85.8 fL (ref 78.0–100.0)
Monocytes Absolute: 0.7 10*3/uL (ref 0.1–1.0)
Monocytes Relative: 8 %
Neutro Abs: 6.5 10*3/uL (ref 1.7–7.7)
Neutrophils Relative %: 70 %
Platelets: 578 10*3/uL — ABNORMAL HIGH (ref 150–400)
RBC: 3.46 MIL/uL — ABNORMAL LOW (ref 3.87–5.11)
RDW: 12.5 % (ref 11.5–15.5)
WBC: 9.4 10*3/uL (ref 4.0–10.5)

## 2016-09-03 LAB — GLUCOSE, CAPILLARY: Glucose-Capillary: 137 mg/dL — ABNORMAL HIGH (ref 65–99)

## 2016-09-03 LAB — CULTURE, BLOOD (ROUTINE X 2)
Culture: NO GROWTH
Culture: NO GROWTH

## 2016-09-03 LAB — I-STAT BETA HCG BLOOD, ED (MC, WL, AP ONLY): I-stat hCG, quantitative: <5 m[IU]/mL (ref <5)

## 2016-09-03 LAB — I-STAT CG4 LACTIC ACID, ED
Lactic Acid, Venous: 1.17 mmol/L (ref 0.5–1.9)
Lactic Acid, Venous: 0.89 mmol/L (ref 0.5–1.9)

## 2016-09-03 LAB — CBG MONITORING, ED: Glucose-Capillary: 188 mg/dL — ABNORMAL HIGH (ref 65–99)

## 2016-09-03 MED ORDER — LISINOPRIL 10 MG PO TABS
10.0000 mg | ORAL_TABLET | Freq: Every day | ORAL | Status: DC
  Administered 2016-09-04 – 2016-09-05 (×2): 10 mg via ORAL
  Filled 2016-09-03 (×2): qty 1

## 2016-09-03 MED ORDER — INSULIN ASPART 100 UNIT/ML ~~LOC~~ SOLN
4.0000 [IU] | Freq: Three times a day (TID) | SUBCUTANEOUS | Status: DC
  Administered 2016-09-04 – 2016-09-05 (×3): 4 [IU] via SUBCUTANEOUS
  Filled 2016-09-03: qty 1

## 2016-09-03 MED ORDER — VANCOMYCIN HCL IN DEXTROSE 750-5 MG/150ML-% IV SOLN
750.0000 mg | Freq: Two times a day (BID) | INTRAVENOUS | Status: DC
  Administered 2016-09-04 – 2016-09-05 (×5): 750 mg via INTRAVENOUS
  Filled 2016-09-03 (×6): qty 150

## 2016-09-03 MED ORDER — INSULIN GLARGINE 100 UNIT/ML ~~LOC~~ SOLN
50.0000 [IU] | Freq: Two times a day (BID) | SUBCUTANEOUS | Status: DC
  Administered 2016-09-04 – 2016-09-05 (×4): 50 [IU] via SUBCUTANEOUS
  Filled 2016-09-03 (×6): qty 0.5

## 2016-09-03 MED ORDER — LACTATED RINGERS IV SOLN
INTRAVENOUS | Status: DC
  Administered 2016-09-03: via INTRAVENOUS

## 2016-09-03 MED ORDER — DEXTROSE 5 % IV SOLN
2.0000 g | Freq: Every day | INTRAVENOUS | Status: DC
  Administered 2016-09-04 – 2016-09-05 (×3): 2 g via INTRAVENOUS
  Filled 2016-09-03 (×4): qty 2

## 2016-09-03 MED ORDER — ACETAMINOPHEN 325 MG PO TABS
650.0000 mg | ORAL_TABLET | Freq: Four times a day (QID) | ORAL | Status: DC | PRN
  Administered 2016-09-04 – 2016-09-05 (×2): 650 mg via ORAL
  Filled 2016-09-03 (×2): qty 2

## 2016-09-03 MED ORDER — ENOXAPARIN SODIUM 40 MG/0.4ML ~~LOC~~ SOLN
40.0000 mg | Freq: Every day | SUBCUTANEOUS | Status: DC
  Administered 2016-09-04 (×2): 40 mg via SUBCUTANEOUS
  Filled 2016-09-03 (×2): qty 0.4

## 2016-09-03 MED ORDER — KETOROLAC TROMETHAMINE 30 MG/ML IJ SOLN: 30.0000 mg | Freq: Four times a day (QID) | INTRAMUSCULAR | Status: DC | PRN

## 2016-09-03 MED ORDER — SIMVASTATIN 5 MG PO TABS
5.0000 mg | ORAL_TABLET | Freq: Every day | ORAL | Status: DC
  Administered 2016-09-05: 18:00:00 5 mg via ORAL
  Filled 2016-09-03 (×3): qty 1

## 2016-09-03 MED ORDER — METRONIDAZOLE 500 MG PO TABS
500.0000 mg | ORAL_TABLET | Freq: Three times a day (TID) | ORAL | Status: DC
  Administered 2016-09-04 – 2016-09-05 (×5): 500 mg via ORAL
  Filled 2016-09-03 (×5): qty 1

## 2016-09-03 MED ORDER — ACETAMINOPHEN 650 MG RE SUPP: 650.0000 mg | Freq: Four times a day (QID) | RECTAL | Status: DC | PRN

## 2016-09-03 MED ORDER — DOCUSATE SODIUM 100 MG PO CAPS
100.0000 mg | ORAL_CAPSULE | Freq: Two times a day (BID) | ORAL | Status: DC
Start: 2016-09-03 — End: 2016-09-06
  Administered 2016-09-04 – 2016-09-06 (×4): 100 mg via ORAL
  Filled 2016-09-03 (×5): qty 1

## 2016-09-03 MED ORDER — ONDANSETRON HCL 4 MG/2ML IJ SOLN: 4.0000 mg | Freq: Four times a day (QID) | INTRAMUSCULAR | Status: DC | PRN

## 2016-09-03 MED ORDER — INSULIN ASPART 100 UNIT/ML ~~LOC~~ SOLN
0.0000 [IU] | Freq: Three times a day (TID) | SUBCUTANEOUS | Status: DC
  Administered 2016-09-04: 2 [IU] via SUBCUTANEOUS
  Administered 2016-09-04 – 2016-09-05 (×2): 3 [IU] via SUBCUTANEOUS
  Filled 2016-09-03: qty 1

## 2016-09-03 MED ORDER — CLINDAMYCIN PHOSPHATE 600 MG/50ML IV SOLN
600.0000 mg | Freq: Once | INTRAVENOUS | Status: AC
  Administered 2016-09-03: 600 mg via INTRAVENOUS
  Filled 2016-09-03: qty 50

## 2016-09-03 MED ORDER — INSULIN ASPART 100 UNIT/ML ~~LOC~~ SOLN
0.0000 [IU] | Freq: Every day | SUBCUTANEOUS | Status: DC
  Administered 2016-09-04: 22:00:00 3 [IU] via SUBCUTANEOUS

## 2016-09-03 MED ORDER — INFLUENZA VAC SPLIT QUAD 0.5 ML IM SUSY
0.5000 mL | PREFILLED_SYRINGE | INTRAMUSCULAR | Status: AC
  Administered 2016-09-05: 0.5 mL via INTRAMUSCULAR
  Filled 2016-09-03: qty 0.5

## 2016-09-03 MED ORDER — ASPIRIN EC 81 MG PO TBEC
81.0000 mg | DELAYED_RELEASE_TABLET | Freq: Every day | ORAL | Status: DC
  Administered 2016-09-04 – 2016-09-06 (×2): 81 mg via ORAL
  Filled 2016-09-03 (×3): qty 1

## 2016-09-03 MED ORDER — ONDANSETRON HCL 4 MG PO TABS: 4.0000 mg | ORAL_TABLET | Freq: Four times a day (QID) | ORAL | Status: DC | PRN

## 2016-09-03 NOTE — Telephone Encounter (Signed)
Call from Dr. [name] at [facility] ortho- he is seeing this diabetic pt for a great toe infection today.  She was seen here yesterday and was given a shot of rocephin, she is also on oral abx. Dr. [name] feels that she needs admission for treatment of this infection. Asked him to send the pt to the ER as I will not be able to direct admit pt without seeing her in the last 24 hours.   Called ED and gave brief report

## 2016-09-03 NOTE — H&P (Signed)
History and Physical    [name] [id number] DOB: 10-09-1965 DOA: 09/03/2016  PCP: Penni Homans, MD Consultants:  None Patient coming from: home - lives with husband; NOKGardiner Davis, [phone]  Chief Complaint: toe infection  HPI: [name] is a 50 y.o. female with medical history significant of PVD, diabetic neuropathy, HTN, HLD presenting with a toe infection.  Last Tuesday, she was working in the yard when she noticed that there was a cut on the bottom of her big toe.  Had a knot on the side of it.  Went ot see Dr. Charlett Blake 12/19, temp up to 101.  Went to ER at [facility].  Debrided, wrapped foot, [name] to look for foreign body, given Keflex and was instructed to return if it worsened.  Returned 12/21, positive blood cultures, "changed to a stronger antibiotic" - Bactrim.  Noticed streaking from foot up her leg, took and xray to evaluate for osteomyelitis and changed to Clindamycin.  Told her to f/u with PCP, saw Dr. Cheri Rous on 12/26, given Rocephin 2 grams and referred to ortho.  She referred to [facility], where she went today and saw Dr. [name] and he sent her to the ER.  Fever to 101 last week, now 99.1.  Slight chills, no sweats.  No pain in her foot unless she tries to walk on it or moves it a certain way.  The left leg is sore.    Tongue feels raw, sores in the back of her throat.  Mouth is slightly dry.    ED Course:   Per Billie Lade: Per chart review, pt was originally seen on 08/26/16 in ED. On that day she was diagnosed with cellulitis and abscess of toe of L foot. I&D was performed. WBC 19. Pt was discharged with bactrim and keflex.  08/28/16 Pt returned to ED because she was notified that her blood cultures were positive. Blood culture came back positive 1/2 for bacillus species. WBC 14 with clinical improvement. Pt was discharged without antibiotics.  08/29/17 Pt returned to ED with worsening of pain and swelling with streaking up L lower leg. WBC 11. X-ray  negative for osteo. ID and micro consulted, who thought culture likely contaminated. Pt was discharged with clindamycin for worsening clinical symptoms.  09/03/17 Telephone encounter in which Dr. [name] at [facility] communicated with Dr. Lorelei Pont (family medicine) and requested admission for IV antibiotics.  08/23/16 LLE U/S negative for DVT  08/29/16 L foot x-ray negative for osteo, soft tissue inflammation  Today lactic acid, CBC wnl. Pt afebrile. Other VSS. Pt is not tender at L great toe or with any LLE ROM at L knee, ankle or toes. There is mild edema and erythema at posterior and anterior aspect of L great toe, some clear oozing. No fluctuance at L great toe. Pt reports mildly tenderness at LL calf and with ambulation. Per chart review, pt was seen by PCP yesterday who gave her rocephin 2g and sent her over to ortho. I am unable to find orthopedist note, but there is a phone encounter from today in which Dr. [name] (ortho) communicated with Dr. Lorelei Pont (family medicine) and requested admission for IV antibiotics. Pt discussed with Dr. Vanita Panda who assisted in consult for admission.  L great toe x-ray pending. Blood cultures pending. Clindamycin started.   Review of Systems: As per HPI; otherwise 10 point review of systems reviewed and negative.   Ambulatory Status:  ambualtes wuithout difficulty normalyll  Past Medical History:  Diagnosis Date  .  Acute bronchitis 07/08/2011  . COMMON MIGRAINE 10/07/2010  . Depression 12/18/2012  . Diabetes mellitus type II, uncontrolled ([place]) 10/07/2010   Qualifier: Diagnosis of  By: Charlett Blake MD, [name]    . Diabetic foot infection ([place]) 08/26/2016  . Disturbance of skin sensation 10/07/2010  . HEART MURMUR, HX OF 10/07/2010  . Hyperlipidemia 12/06/2010  . Hypertension   . NEPHROLITHIASIS, HX OF 10/07/2010  . Overweight(278.02) 12/06/2010  . PERIPHERAL NEUROPATHY, FEET 10/07/2010  . PVD (peripheral vascular disease) ([place]) 01/21/2012  . RESTLESS LEG  SYNDROME 10/25/2010  . SOB (shortness of breath) 04/01/2012  . Stroke [facility]) 2014, 2017   most recently in 2/17 - intracerebral hemorrhage  . Weakness of right side of body 04/19/2013    Past Surgical History:  Procedure Laterality Date  . WISDOM TOOTH EXTRACTION      Social History   Social History  . Marital status: Single    Spouse name: N/A  . Number of children: N/A  . Years of education: N/A   Occupational History  . machine operator    Social History Main Topics  . Smoking status: Former Smoker    Packs/day: 0.50    Quit date: 09/08/2005  . Smokeless tobacco: Never Used  . Alcohol use No  . Drug use: No  . Sexual activity: Yes    Partners: Male    Birth control/ protection: None   Other Topics Concern  . Not on file   Social History Narrative   Lives with husband in a two story home.  Has one child.     Works as a Glass blower/designer.     Education: high school.    Allergies  Allergen Reactions  . Lyrica [Pregabalin] Other (See Comments)    MADE PATIENT VERY EMOTIONAL AND WOULD CRY EASILY  . Tramadol Nausea And Vomiting    Pt cant tolerate this pain med.   . Morphine And Related Nausea And Vomiting    Family History  Problem Relation Age of Onset  . Arthritis Mother   . Stroke Brother     previous smoker  . Alcohol abuse Brother     in remission  . Leukemia Brother   . Diabetes Paternal Grandmother   . Healthy Son     Prior to Admission medications   Medication Sig Start Date End Date Taking? Authorizing Provider  aspirin EC 81 MG tablet Take 81 mg by mouth daily.   Yes Historical Provider, MD  cephALEXin (KEFLEX) 500 MG capsule Take 1 capsule (500 mg total) by mouth 4 (four) times daily. 08/26/16  Yes Deno Etienne, DO  clindamycin (CLEOCIN) 150 MG capsule Take 2 capsules (300 mg total) by mouth 3 (three) times daily. May dispense as 150mg  capsules 08/29/16  Yes Chesley Noon [place], [place]  Insulin Glargine (LANTUS SOLOSTAR) 100 UNIT/ML Solostar Pen  Inject 50 Units into the skin 2 (two) times daily. 10/09/15  Yes Mosie Lukes, MD  lisinopril (PRINIVIL,ZESTRIL) 10 MG tablet TAKE 1 TABLET (10 MG TOTAL) BY MOUTH 2 (TWO) TIMES DAILY. 04/01/16  Yes Mosie Lukes, MD  metFORMIN (GLUCOPHAGE) 1000 MG tablet TAKE 1 TABLET TWICE DAILY AND 1/2 TABLET AT NOON 04/01/16  Yes Mosie Lukes, MD  simvastatin (ZOCOR) 5 MG tablet Take 1 tablet (5 mg total) by mouth daily. 05/19/16  Yes Mosie Lukes, MD  sulfamethoxazole-trimethoprim (BACTRIM DS,SEPTRA DS) 800-160 MG tablet Take 1 tablet by mouth 2 (two) times daily.   Yes Historical Provider, MD  cyclobenzaprine (FLEXERIL) 10 MG  tablet Take 1 tablet (10 mg total) by mouth 3 (three) times daily as needed for muscle spasms. Patient not taking: Reported on 09/03/2016 05/14/16   Brunetta Jeans, PA-C  glucose blood (ONE TOUCH ULTRA TEST) test strip Use once daily to check blood sugar.  DX E11.9 01/04/16   Mosie Lukes, MD  HYDROcodone-acetaminophen (NORCO) 10-325 MG tablet Take 1 tablet by mouth every 8 (eight) hours as needed. Patient not taking: Reported on 09/03/2016 05/14/16   Brunetta Jeans, PA-C  Insulin Pen Needle 32G X 4 MM MISC Use as directed for insulin.DX E11.65 03/03/16   Mosie Lukes, MD  Lancets (FREESTYLE) lancets DX- 250.02  Pts machine is Freestyle Freedom Lite 04/20/13   Mosie Lukes, MD  LANTUS SOLOSTAR 100 UNIT/ML Solostar Pen INJECT 34 UNITS IN AM & 32 UNITS IN PM. INCREASE BY 2 UNITS EVERY 3 DAYS AS LONG AS ABOVE 100 Patient not taking: Reported on 09/03/2016 05/12/16   Mosie Lukes, MD  methocarbamol (ROBAXIN) 500 MG tablet Take 1 tablet (500 mg total) by mouth 2 (two) times daily. Patient not taking: Reported on 09/03/2016 05/17/16   Fransico Meadow, PA-C  metoCLOPramide (REGLAN) 10 MG tablet Take 1 tablet (10 mg total) by mouth every 8 (eight) hours as needed for nausea. Patient not taking: Reported on 09/03/2016 03/28/16   Malvin Johns, MD  oxyCODONE-acetaminophen (PERCOCET/ROXICET)  5-325 MG tablet Take 2 tablets by mouth every 4 (four) hours as needed for severe pain. Patient not taking: Reported on 09/03/2016 05/17/16   Fransico Meadow, PA-C    Physical Exam: Vitals:   09/03/16 1859 09/03/16 2210 09/03/16 2212 09/03/16 2345  BP: 151/91 160/97  136/73  Pulse: 94 91  87  Resp: 16 18  18   Temp:    98.4 F (36.9 C)  TempSrc:    Oral  SpO2: 98% 99%  100%  Weight:   65.8 kg (145 lb)   Height:   5\' 2"  (1.575 m)      General:  Appears calm and comfortable and is NAD Eyes:  PERRL, EOMI, normal lids, iris ENT:  grossly normal hearing, mmm, scattered white patches along posterior tongue, hard palate, posterior oropharynx Neck: no LAD, masses or thyromegaly Cardiovascular:  RRR, no m/r/g. No LE edema.  Respiratory:  CTA bilaterally, no w/r/r. Normal respiratory effort. Abdomen:  soft, ntnd, NABS Skin:  Clearly infected foot ulcer on the plantar great toe with necrotic area covering most of the toe pad, erythema on dorsum of foot and streaking up the left leg to the knee Musculoskeletal:  grossly normal tone BUE/BLE, good ROM, no bony abnormality Psychiatric:  grossly normal mood and affect, speech fluent and appropriate, AOx3 Neurologic:  CN 2-12 grossly intact, moves all extremities in coordinated fashion, sensation diminished in feet  Labs on Admission: I have personally reviewed following labs and imaging studies  CBC:  Recent Labs Lab 08/28/16 0826 08/29/16 1059 09/03/16 1751  WBC 14.4* 11.1* 9.4  NEUTROABS 11.6* 7.9* 6.5  HGB 10.8* 10.3* 10.1*  HCT 32.7* 30.7* 29.7*  MCV 87.7 87.5 85.8  PLT 322 374 [id number]*   Basic Metabolic Panel:  Recent Labs Lab 08/29/16 1059 09/03/16 1751  NA 136 137  K 3.6 3.7  CL 103 101  CO2 25 27  GLUCOSE 185* 272*  BUN 17 14  CREATININE 0.62 0.61  CALCIUM 8.8* 9.3   GFR: Estimated Creatinine Clearance: 73.2 mL/min (by C-G formula based on SCr of 0.61 mg/dL). Liver Function  Tests:  Recent Labs Lab 08/29/16 1059  09/03/16 1751  AST 22 15  ALT 23 17  ALKPHOS 89 84  BILITOT 0.5 0.3  PROT 7.2 7.4  ALBUMIN 3.2* 3.0*   No results for input(s): LIPASE, AMYLASE in the last 168 hours. No results for input(s): AMMONIA in the last 168 hours. Coagulation Profile: No results for input(s): INR, PROTIME in the last 168 hours. Cardiac Enzymes: No results for input(s): CKTOTAL, CKMB, CKMBINDEX, TROPONINI in the last 168 hours. BNP (last 3 results) No results for input(s): PROBNP in the last 8760 hours. HbA1C: No results for input(s): HGBA1C in the last 72 hours. CBG:  Recent Labs Lab 08/29/16 1004 09/03/16 2048 09/03/16 2335  GLUCAP 150* 188* 137*   Lipid Profile: No results for input(s): CHOL, HDL, LDLCALC, TRIG, CHOLHDL, LDLDIRECT in the last 72 hours. Thyroid Function Tests: No results for input(s): TSH, T4TOTAL, FREET4, T3FREE, THYROIDAB in the last 72 hours. Anemia Panel: No results for input(s): VITAMINB12, FOLATE, FERRITIN, TIBC, IRON, RETICCTPCT in the last 72 hours. Urine analysis:    Component Value Date/Time   COLORURINE YELLOW 09/03/2016 2113   APPEARANCEUR HAZY (A) 09/03/2016 2113   LABSPEC 1.018 09/03/2016 2113   PHURINE 5.0 09/03/2016 2113   GLUCOSEU NEGATIVE 09/03/2016 2113   GLUCOSEU NEGATIVE 05/21/2015 0831   HGBUR SMALL (A) 09/03/2016 2113   BILIRUBINUR NEGATIVE 09/03/2016 2113   [place] NEGATIVE 09/03/2016 2113   PROTEINUR 100 (A) 09/03/2016 2113   UROBILINOGEN 0.2 05/21/2015 0831   NITRITE NEGATIVE 09/03/2016 2113   LEUKOCYTESUR MODERATE (A) 09/03/2016 2113    Creatinine Clearance: Estimated Creatinine Clearance: 73.2 mL/min (by C-G formula based on SCr of 0.61 mg/dL).  Sepsis Labs: @LABRCNTIP (procalcitonin:4,lacticidven:4) ) Recent Results (from the past 240 hour(s))  Blood culture (routine x 2)     Status: Abnormal   Collection Time: 08/26/16  4:25 PM  Result Value Ref Range Status   Specimen Description BLOOD RIGHT FOREARM  Final   Special Requests  BOTTLES DRAWN AEROBIC AND ANAEROBIC 5ML  Final   Culture  Setup Time   Final    GRAM VARIABLE ROD ANAEROBIC BOTTLE ONLY CRITICAL RESULT CALLED TO, READ BACK BY AND VERIFIED WITH: Rosemarie Ax RN 13:30 08/27/16 ([email or web address])    Culture (A)  Final    BACILLUS SPECIES Standardized susceptibility testing for this organism is not available. Performed at [facility]    Report Status 08/29/2016 FINAL  Final  Blood Culture ID Panel (Reflexed)     Status: None   Collection Time: 08/26/16  4:25 PM  Result Value Ref Range Status   Enterococcus species NOT DETECTED NOT DETECTED Final   Listeria monocytogenes NOT DETECTED NOT DETECTED Final   Staphylococcus species NOT DETECTED NOT DETECTED Final   Staphylococcus aureus NOT DETECTED NOT DETECTED Final   Streptococcus species NOT DETECTED NOT DETECTED Final   Streptococcus agalactiae NOT DETECTED NOT DETECTED Final   Streptococcus pneumoniae NOT DETECTED NOT DETECTED Final   Streptococcus pyogenes NOT DETECTED NOT DETECTED Final   Acinetobacter baumannii NOT DETECTED NOT DETECTED Final   Enterobacteriaceae species NOT DETECTED NOT DETECTED Final   Enterobacter cloacae complex NOT DETECTED NOT DETECTED Final   Escherichia coli NOT DETECTED NOT DETECTED Final   Klebsiella oxytoca NOT DETECTED NOT DETECTED Final   Klebsiella pneumoniae NOT DETECTED NOT DETECTED Final   Proteus species NOT DETECTED NOT DETECTED Final   Serratia marcescens NOT DETECTED NOT DETECTED Final   Haemophilus influenzae NOT DETECTED NOT DETECTED Final   Neisseria  meningitidis NOT DETECTED NOT DETECTED Final   Pseudomonas aeruginosa NOT DETECTED NOT DETECTED Final   Candida albicans NOT DETECTED NOT DETECTED Final   Candida glabrata NOT DETECTED NOT DETECTED Final   Candida krusei NOT DETECTED NOT DETECTED Final   Candida parapsilosis NOT DETECTED NOT DETECTED Final   Candida tropicalis NOT DETECTED NOT DETECTED Final    Comment: Performed at [facility]  Blood  culture (routine x 2)     Status: None   Collection Time: 08/26/16  4:28 PM  Result Value Ref Range Status   Specimen Description BLOOD LEFT HAND  Final   Special Requests IN PEDIATRIC BOTTLE 5.5ML  Final   Culture   Final    NO GROWTH 5 DAYS Performed at [facility]    Report Status 08/31/2016 FINAL  Final  Blood culture (routine x 2)     Status: None   Collection Time: 08/29/16 10:50 AM  Result Value Ref Range Status   Specimen Description BLOOD RIGHT ANTECUBITAL  Final   Special Requests IN PEDIATRIC BOTTLE [facility]  Final   Culture   Final    NO GROWTH 5 DAYS Performed at [facility]    Report Status 09/03/2016 FINAL  Final  Blood culture (routine x 2)     Status: None   Collection Time: 08/29/16 10:55 AM  Result Value Ref Range Status   Specimen Description BLOOD RIGHT HAND  Final   Special Requests BOTTLES DRAWN AEROBIC AND ANAEROBIC 4CC  Final   Culture   Final    NO GROWTH 5 DAYS Performed at [facility]    Report Status 09/03/2016 FINAL  Final     Radiological Exams on Admission: US Venous Img Lower Unilateral Left  Result Date: 09/02/2016 CLINICAL DATA:  Left foot and calf pain. EXAM: LEFT LOWER EXTREMITY VENOUS DOPPLER ULTRASOUND TECHNIQUE: Gray-scale sonography with graded compression, as well as color Doppler and duplex ultrasound, were performed to evaluate the deep venous system from the level of the common femoral vein through the popliteal and proximal calf veins. Spectral Doppler was utilized to evaluate flow at rest and with distal augmentation maneuvers. COMPARISON:  None. FINDINGS: Right common femoral vein is patent without thrombus. Normal compressibility, augmentation and color Doppler flow in the left common femoral vein, left femoral vein and left popliteal vein. The left saphenofemoral junction is patent. Left profunda femoral vein is patent without thrombus. Visualized left deep calf veins are patent without thrombus. Left great  saphenous vein is compressible without thrombus. IMPRESSION: Negative for deep venous thrombosis in left lower extremity. Electronically Signed   By: Markus Daft M.D.   On: 09/02/2016 14:43   Dg Toe Great Left  Result Date: 09/03/2016 CLINICAL DATA:  Subacute onset of left plantar great toe wound, with erythema and swelling. Initial encounter. EXAM: LEFT GREAT TOE COMPARISON:  Left foot radiographs performed 08/29/2016 FINDINGS: There is no evidence of fracture or dislocation. The joint spaces are preserved. There is a bipartite medial sesamoid of the great toe. Soft tissue swelling is noted about the great toe. The known soft tissue wound is not well characterized on radiograph. No radiopaque foreign bodies are seen. IMPRESSION: 1. No evidence of fracture or dislocation. 2. Bipartite medial sesamoid of the great toe. 3. Soft tissue swelling about the great toe. Electronically Signed   By: Garald Balding M.D.   On: 09/03/2016 20:19    EKG: not done  Assessment/Plan Active Problems:   Diabetic polyneuropathy associated with  diabetes mellitus due to underlying condition ([place])   Diabetic foot infection ([place])   Cellulitis   Oral thrush   Moderate malnutrition ([place])   Anemia of chronic disease   Diabetic foot infection -While there is clearly a cellulitis component, there is also concern for a deeper infection and is concerning for osteomyelitis -WBC normal -Will admit, Med Surg -Antibiotics with Rocephin, Flagyl, and Vancomycin per diabetic foot ulcer order set -MRI to assess for osteomyelitis -Needs consult by orthopedics tomorrow -Diabetic foot infection order set utilized, including orders for CM, SW, DM coordinator, wound care, and nutrition consult (also with malnutrition).  Uncontrolled DM with neuropathy -Glucose 372 on admission -Patient taking Lantus 50 units BID as well as metformin (held) -Reports blood sugars on average running about 100 - but last A1c in 7/17 was 12.6 so will  repeat A1c -Needs good glycemic control for wound healing -Will currently cover with basal/bolus and SSI insulin  Thrush -Likely from poorly controlled DM in conjunction with recent abx -Will use magic mouthwash with Nystatin  Anemia -Hgb 10.1, steadily decreased from 13.3 on 9/10 - normocytic -May be related to progressive foot infection, malnutrition, etc.   DVT prophylaxis:  Lovenox  Code Status: Full - confirmed with patient Family Communication: None present Disposition Plan: Home once clinically improved Consults called: None - suggest ortho in AM  Admission status: Admit - It is my clinical opinion that admission to [place] is reasonable and necessary because this patient will require at least 2 midnights in the hospital to treat this condition based on the medical complexity of the problems presented.  Given the aforementioned information, the predictability of an adverse outcome is felt to be significant.     Karmen Bongo MD [facility]  If 7PM-7AM, please contact night-coverage [email or web address] Password [email or web address]  09/04/2016, 2:23 AM

## 2016-09-03 NOTE — ED Triage Notes (Signed)
Pt from home with complaints of infection in left foot. Pt was seen at her PCP and [facility] for this. Pt states her antibiotic has been adjusted to stronger antibiotics. Pt states she was seen at [facility] who told her to come here to be admitted for IV antibiotics to prevent further swelling of the infection. Pt states all of this began last Tuesday. Pt was not febrile, but was tachycardic at time of assessment. Pt has hx of diabetes

## 2016-09-03 NOTE — ED Provider Notes (Signed)
[facility] DEPT Provider Note   CSN: [id number] Arrival date & time: 09/03/16  1422     History   Chief Complaint No chief complaint on file.   HPI [name] is a 50 y.o. female with pmh of DM with neuropathy, HTN, PVD and CVA presents to the ED with L toe redness, swelling and oozing associated with L lower extremity tenderness since 08/26/16.  Pt states she first noticed a skin tear at the bottom of her L great toe, the next day the skin became blue so she went to the ED where they discharged her with PO antibiotics.  Pt returned to ED a couple of days later with worsening symptoms, she was given a different type of PO antibiotics.  Pt states she saw her orthopedist yesterday who told her to come to the ED again for IV antibiotics.  Pt reports dull achy pain in LLE, numbness and low grade fever. Pt also reports raw, painful tongue that started yesterday after she was given a new antibiotic.    HPI  Past Medical History:  Diagnosis Date  . Acute bronchitis 07/08/2011  . COMMON MIGRAINE 10/07/2010  . CVA (cerebral infarction) 04/19/2013  . Depression 12/18/2012  . Diabetes mellitus type II, uncontrolled ([place]) 10/07/2010   Qualifier: Diagnosis of  By: Charlett Blake MD, [name]    . Diabetic foot infection ([place]) 08/26/2016  . Disturbance of skin sensation 10/07/2010  . Essential hypertension, benign 10/07/2010  . Heart murmur   . HEART MURMUR, HX OF 10/07/2010  . Hyperlipidemia 12/06/2010  . Hypertension   . NEPHROLITHIASIS, HX OF 10/07/2010  . Overweight(278.02) 12/06/2010  . PERIPHERAL NEUROPATHY, FEET 10/07/2010  . PVD (peripheral vascular disease) ([place]) 01/21/2012  . RESTLESS LEG SYNDROME 10/25/2010  . SOB (shortness of breath) 04/01/2012  . Stroke [facility]) 2014, 2017  . Weakness of right side of body 04/19/2013    Patient Active Problem List   Diagnosis Date Noted  . Diabetic foot infection ([place]) 08/26/2016  . Diabetic polyneuropathy associated with diabetes mellitus due to  underlying condition ([place]) 05/07/2016  . Orthostatic hypotension 05/07/2016  . Cerebrovascular disease 04/27/2016  . Nausea with vomiting 04/01/2016  . ICH (intracerebral hemorrhage) ([place]) 03/30/2016  . Nonhealing nonsurgical wound 03/06/2016  . Visit for preventive health examination 05/21/2015  . Breast cancer screening 05/21/2015  . Arm lesion 07/02/2014  . Pain in joint, ankle and foot 04/28/2014  . Pain in limb 01/13/2014  . Neuropathic pain of both legs 01/13/2014  . Hip pain 01/12/2014  . Allergic rhinitis 03/10/2013  . Back pain 03/08/2013  . Depression 12/18/2012  . PVD (peripheral vascular disease) ([place]) 01/21/2012  . Diabetic ulcer of lower extremity ([place]) 12/22/2011  . Hyperlipidemia 12/06/2010  . Overweight(278.02) 12/06/2010  . RESTLESS LEG SYNDROME 10/25/2010  . Migraine without aura 10/07/2010  . Hereditary and idiopathic peripheral neuropathy 10/07/2010  . Essential hypertension, benign 10/07/2010  . DISTURBANCE OF SKIN SENSATION 10/07/2010  . HEART MURMUR, HX OF 10/07/2010  . NEPHROLITHIASIS, HX OF 10/07/2010    Past Surgical History:  Procedure Laterality Date  . None    . WISDOM TOOTH EXTRACTION      OB History    No data available       Home Medications    Prior to Admission medications   Medication Sig Start Date End Date Taking? Authorizing Provider  aspirin EC 81 MG tablet Take 81 mg by mouth daily.   Yes Historical Provider, MD  cephALEXin (KEFLEX) 500 MG capsule Take  1 capsule (500 mg total) by mouth 4 (four) times daily. 08/26/16  Yes Deno Etienne, DO  clindamycin (CLEOCIN) 150 MG capsule Take 2 capsules (300 mg total) by mouth 3 (three) times daily. May dispense as 150mg  capsules 08/29/16  Yes Chesley Noon [place], [place]  Insulin Glargine (LANTUS SOLOSTAR) 100 UNIT/ML Solostar Pen Inject 50 Units into the skin 2 (two) times daily. 10/09/15  Yes Mosie Lukes, MD  lisinopril (PRINIVIL,ZESTRIL) 10 MG tablet TAKE 1 TABLET (10 MG TOTAL) BY MOUTH  2 (TWO) TIMES DAILY. 04/01/16  Yes Mosie Lukes, MD  metFORMIN (GLUCOPHAGE) 1000 MG tablet TAKE 1 TABLET TWICE DAILY AND 1/2 TABLET AT NOON 04/01/16  Yes Mosie Lukes, MD  simvastatin (ZOCOR) 5 MG tablet Take 1 tablet (5 mg total) by mouth daily. 05/19/16  Yes Mosie Lukes, MD  sulfamethoxazole-trimethoprim (BACTRIM DS,SEPTRA DS) 800-160 MG tablet Take 1 tablet by mouth 2 (two) times daily.   Yes Historical Provider, MD  cyclobenzaprine (FLEXERIL) 10 MG tablet Take 1 tablet (10 mg total) by mouth 3 (three) times daily as needed for muscle spasms. Patient not taking: Reported on 09/03/2016 05/14/16   Brunetta Jeans, PA-C  glucose blood (ONE TOUCH ULTRA TEST) test strip Use once daily to check blood sugar.  DX E11.9 01/04/16   Mosie Lukes, MD  HYDROcodone-acetaminophen (NORCO) 10-325 MG tablet Take 1 tablet by mouth every 8 (eight) hours as needed. Patient not taking: Reported on 09/03/2016 05/14/16   Brunetta Jeans, PA-C  Insulin Pen Needle 32G X 4 MM MISC Use as directed for insulin.DX E11.65 03/03/16   Mosie Lukes, MD  Lancets (FREESTYLE) lancets DX- 250.02  Pts machine is Freestyle Freedom Lite 04/20/13   Mosie Lukes, MD  LANTUS SOLOSTAR 100 UNIT/ML Solostar Pen INJECT 34 UNITS IN AM & 32 UNITS IN PM. INCREASE BY 2 UNITS EVERY 3 DAYS AS LONG AS ABOVE 100 Patient not taking: Reported on 09/03/2016 05/12/16   Mosie Lukes, MD  methocarbamol (ROBAXIN) 500 MG tablet Take 1 tablet (500 mg total) by mouth 2 (two) times daily. Patient not taking: Reported on 09/03/2016 05/17/16   Fransico Meadow, PA-C  metoCLOPramide (REGLAN) 10 MG tablet Take 1 tablet (10 mg total) by mouth every 8 (eight) hours as needed for nausea. Patient not taking: Reported on 09/03/2016 03/28/16   Malvin Johns, MD  oxyCODONE-acetaminophen (PERCOCET/ROXICET) 5-325 MG tablet Take 2 tablets by mouth every 4 (four) hours as needed for severe pain. Patient not taking: Reported on 09/03/2016 05/17/16   Fransico Meadow, PA-C     Family History Family History  Problem Relation Age of Onset  . Arthritis Mother   . Stroke Brother     previous smoker  . Alcohol abuse Brother     in remission  . Leukemia Brother   . Diabetes Paternal Grandmother   . Healthy Son     Social History Social History  Substance Use Topics  . Smoking status: Former Smoker    Packs/day: 0.50    Quit date: 09/08/2005  . Smokeless tobacco: Never Used  . Alcohol use No     Allergies   Lyrica [pregabalin]; Tramadol; and Morphine and related   Review of Systems Review of Systems  Constitutional: Positive for fever. Negative for chills.  HENT: Positive for mouth sores and trouble swallowing. Negative for congestion and sore throat.   Eyes: Negative for visual disturbance.  Respiratory: Negative for cough, choking and shortness of breath.  Cardiovascular: Negative for chest pain and palpitations.  Gastrointestinal: Negative for abdominal pain, nausea and vomiting.  Genitourinary: Negative for dysuria and hematuria.  Musculoskeletal: Positive for gait problem (pain in L foot with walking). Negative for arthralgias and back pain.  Skin: Negative for color change and rash.  Neurological: Positive for numbness (bottom of bilateral feet). Negative for seizures and syncope.  All other systems reviewed and are negative.    Physical Exam Updated Vital Signs BP 151/91 (BP Location: Right Arm)   Pulse 94   Temp 99.1 F (37.3 C) (Oral)   Resp 16   Ht 5\' 2"  (1.575 m)   Wt 66.2 kg   LMP 02/12/2011 (Exact Date)   SpO2 98%   BMI 26.70 kg/m   Physical Exam  Constitutional: She is oriented to person, place, and time. Vital signs are normal. She appears well-developed and well-nourished. No distress.  Temperature 99.72F  HENT:  Head: Normocephalic and atraumatic.  Nose: Nose normal.  Mouth/Throat: Oropharynx is clear and moist. No oropharyngeal exudate.  Mild tongue erythema, there are some sores at posterior tongue with  tenderness.   Eyes: Conjunctivae and EOM are normal. Pupils are equal, round, and reactive to light.  Neck: Normal range of motion. Neck supple.  Cardiovascular: Normal rate, regular rhythm, normal heart sounds and intact distal pulses.   Pulmonary/Chest: Effort normal and breath sounds normal. She has no wheezes.  Abdominal: Soft. There is no tenderness.  Musculoskeletal: Normal range of motion.  Lymphadenopathy:    She has no cervical adenopathy.  Neurological: She is alert and oriented to person, place, and time.  Decreased sensation to light touch in bilateral feet c/w DM neuropathy.   Skin: Skin is warm and dry. Capillary refill takes less than 2 seconds.  Erythema, mild edema and tenderness at LLE.  L great toe with peeling, oozing skin.  L great toe is not tender.  Please see images below.  Psychiatric: She has a normal mood and affect. Her behavior is normal.  Nursing note and vitals reviewed.         ED Treatments / Results  Labs (all labs ordered are listed, but only abnormal results are displayed) Labs Reviewed  COMPREHENSIVE METABOLIC PANEL - Abnormal; Notable for the following:       Result Value   Glucose, Bld 272 (*)    Albumin 3.0 (*)    All other components within normal limits  CBC WITH DIFFERENTIAL/PLATELET - Abnormal; Notable for the following:    RBC 3.46 (*)    Hemoglobin 10.1 (*)    HCT 29.7 (*)    Platelets 578 (*)    All other components within normal limits  URINE CULTURE  CULTURE, BLOOD (ROUTINE X 2)  CULTURE, BLOOD (ROUTINE X 2)  URINALYSIS, ROUTINE W REFLEX MICROSCOPIC  I-STAT BETA HCG BLOOD, ED ([facility], [facility], [facility] ONLY)  I-STAT CG4 LACTIC ACID, ED  I-STAT CG4 LACTIC ACID, ED  CBG MONITORING, ED    EKG  EKG Interpretation None       Radiology US Venous Img Lower Unilateral Left  Result Date: 09/02/2016 CLINICAL DATA:  Left foot and calf pain. EXAM: LEFT LOWER EXTREMITY VENOUS DOPPLER ULTRASOUND TECHNIQUE: Gray-scale sonography with  graded compression, as well as color Doppler and duplex ultrasound, were performed to evaluate the deep venous system from the level of the common femoral vein through the popliteal and proximal calf veins. Spectral Doppler was utilized to evaluate flow at rest and with distal augmentation maneuvers. COMPARISON:  None. FINDINGS: Right common femoral vein is patent without thrombus. Normal compressibility, augmentation and color Doppler flow in the left common femoral vein, left femoral vein and left popliteal vein. The left saphenofemoral junction is patent. Left profunda femoral vein is patent without thrombus. Visualized left deep calf veins are patent without thrombus. Left great saphenous vein is compressible without thrombus. IMPRESSION: Negative for deep venous thrombosis in left lower extremity. Electronically Signed   By: Markus Daft M.D.   On: 09/02/2016 14:43    Procedures Procedures (including critical care time)  Medications Ordered in ED Medications  clindamycin (CLEOCIN) IVPB 600 mg (not administered)     Initial Impression / Assessment and Plan / ED Course  I have reviewed the triage vital signs and the nursing notes.  Pertinent labs & imaging results that were available during my care of the patient were reviewed by me and considered in my medical decision making (see chart for details).  Clinical Course    Per chart review, pt was originally seen on 08/26/16 in ED.  On that day she was diagnosed with cellulitis and abscess of toe of L foot. I&D was performed.  WBC 19.  Pt was discharged with bactrim and keflex.   08/28/16 Pt returned to ED because she was notified that her blood cultures were positive.  Blood culture came back positive 1/2 for bacillus species. WBC 14 with clinical improvement.  Pt was discharged without antibiotics.   08/29/17 Pt returned to ED with worsening of pain and swelling with streaking up L lower leg.  WBC 11.  X-ray negative for osteo.  ID and micro  consulted, who thought culture likely contaminated.  Pt was discharged with clindamycin for worsening clinical symptoms.    09/03/17 Telephone encounter in which Dr. [name] at [facility] communicated with Dr. Lorelei Pont (family medicine) and requested admission for IV antibiotics.    08/23/16 LLE U/S negative for DVT 08/29/16 L foot x-ray negative for osteo, soft tissue inflammation  Today lactic acid, CBC wnl.  Pt afebrile.  Other VSS. Pt is not tender at L great toe or with any LLE ROM at L knee, ankle or toes.  There is mild edema and erythema at posterior and anterior aspect of L great toe, some clear oozing.  No fluctuance at L great toe.  Pt reports mildly tenderness at LL calf and with ambulation.  Per chart review, pt was seen by PCP yesterday who gave her rocephin 2g and sent her over to ortho.  I am unable to find orthopedist note, but there is a phone encounter from today in which Dr. [name] (ortho) communicated with Dr. Lorelei Pont (family medicine) and requested admission for IV antibiotics.  Pt discussed with Dr. Vanita Panda who assisted in consult for admission.   L great toe x-ray pending. Blood cultures pending. Clindamycin started.  Final Clinical Impressions(s) / ED Diagnoses   Final diagnoses:  None    New Prescriptions New Prescriptions   No medications on file     Kinnie Feil, PA-C 09/03/16 [place], PA-C 09/03/16 [place], MD 09/04/16 [phone]

## 2016-09-03 NOTE — ED Notes (Signed)
Coming from PCP-infected great toe-states will need IV antibiotics

## 2016-09-03 NOTE — Telephone Encounter (Signed)
[facility] called requesting to speak to Dr. Charlett Blake or doctor on call.  Dr. [name] wants to admit patient.  Call transferred call to Dr. Lorelei Pont, Doc of the day.

## 2016-09-03 NOTE — ED Notes (Signed)
Hold off on blood cultures per PA

## 2016-09-03 NOTE — ED Notes (Signed)
Pt made aware of need for urine specimen 

## 2016-09-04 ENCOUNTER — Encounter: Payer: BLUE CROSS/BLUE SHIELD | Admitting: Family Medicine

## 2016-09-04 ENCOUNTER — Observation Stay (HOSPITAL_COMMUNITY): Payer: BLUE CROSS/BLUE SHIELD

## 2016-09-04 ENCOUNTER — Other Ambulatory Visit (HOSPITAL_COMMUNITY): Payer: Self-pay | Admitting: Radiology

## 2016-09-04 ENCOUNTER — Observation Stay (HOSPITAL_BASED_OUTPATIENT_CLINIC_OR_DEPARTMENT_OTHER): Payer: BLUE CROSS/BLUE SHIELD

## 2016-09-04 DIAGNOSIS — E11628 Type 2 diabetes mellitus with other skin complications: Secondary | ICD-10-CM | POA: Diagnosis present

## 2016-09-04 DIAGNOSIS — E1169 Type 2 diabetes mellitus with other specified complication: Secondary | ICD-10-CM | POA: Diagnosis present

## 2016-09-04 DIAGNOSIS — Z7982 Long term (current) use of aspirin: Secondary | ICD-10-CM | POA: Diagnosis not present

## 2016-09-04 DIAGNOSIS — G43009 Migraine without aura, not intractable, without status migrainosus: Secondary | ICD-10-CM | POA: Diagnosis present

## 2016-09-04 DIAGNOSIS — E11649 Type 2 diabetes mellitus with hypoglycemia without coma: Secondary | ICD-10-CM | POA: Diagnosis present

## 2016-09-04 DIAGNOSIS — M86172 Other acute osteomyelitis, left ankle and foot: Secondary | ICD-10-CM | POA: Diagnosis present

## 2016-09-04 DIAGNOSIS — L03818 Cellulitis of other sites: Secondary | ICD-10-CM | POA: Diagnosis present

## 2016-09-04 DIAGNOSIS — E43 Unspecified severe protein-calorie malnutrition: Secondary | ICD-10-CM | POA: Diagnosis present

## 2016-09-04 DIAGNOSIS — E1142 Type 2 diabetes mellitus with diabetic polyneuropathy: Secondary | ICD-10-CM | POA: Diagnosis present

## 2016-09-04 DIAGNOSIS — E440 Moderate protein-calorie malnutrition: Secondary | ICD-10-CM | POA: Diagnosis present

## 2016-09-04 DIAGNOSIS — E0842 Diabetes mellitus due to underlying condition with diabetic polyneuropathy: Secondary | ICD-10-CM | POA: Diagnosis not present

## 2016-09-04 DIAGNOSIS — B370 Candidal stomatitis: Secondary | ICD-10-CM | POA: Diagnosis present

## 2016-09-04 DIAGNOSIS — Z8261 Family history of arthritis: Secondary | ICD-10-CM | POA: Diagnosis not present

## 2016-09-04 DIAGNOSIS — D638 Anemia in other chronic diseases classified elsewhere: Secondary | ICD-10-CM | POA: Diagnosis present

## 2016-09-04 DIAGNOSIS — Z833 Family history of diabetes mellitus: Secondary | ICD-10-CM | POA: Diagnosis not present

## 2016-09-04 DIAGNOSIS — G2581 Restless legs syndrome: Secondary | ICD-10-CM | POA: Diagnosis present

## 2016-09-04 DIAGNOSIS — E785 Hyperlipidemia, unspecified: Secondary | ICD-10-CM | POA: Diagnosis present

## 2016-09-04 DIAGNOSIS — I679 Cerebrovascular disease, unspecified: Secondary | ICD-10-CM | POA: Diagnosis present

## 2016-09-04 DIAGNOSIS — Z87891 Personal history of nicotine dependence: Secondary | ICD-10-CM | POA: Diagnosis not present

## 2016-09-04 DIAGNOSIS — L089 Local infection of the skin and subcutaneous tissue, unspecified: Secondary | ICD-10-CM | POA: Diagnosis not present

## 2016-09-04 DIAGNOSIS — E1165 Type 2 diabetes mellitus with hyperglycemia: Secondary | ICD-10-CM | POA: Diagnosis present

## 2016-09-04 DIAGNOSIS — I10 Essential (primary) hypertension: Secondary | ICD-10-CM | POA: Diagnosis present

## 2016-09-04 DIAGNOSIS — Z823 Family history of stroke: Secondary | ICD-10-CM | POA: Diagnosis not present

## 2016-09-04 DIAGNOSIS — Z794 Long term (current) use of insulin: Secondary | ICD-10-CM | POA: Diagnosis not present

## 2016-09-04 DIAGNOSIS — L03116 Cellulitis of left lower limb: Secondary | ICD-10-CM | POA: Diagnosis present

## 2016-09-04 DIAGNOSIS — Z8673 Personal history of transient ischemic attack (TIA), and cerebral infarction without residual deficits: Secondary | ICD-10-CM | POA: Diagnosis not present

## 2016-09-04 DIAGNOSIS — E11621 Type 2 diabetes mellitus with foot ulcer: Secondary | ICD-10-CM

## 2016-09-04 DIAGNOSIS — Z811 Family history of alcohol abuse and dependence: Secondary | ICD-10-CM | POA: Diagnosis not present

## 2016-09-04 DIAGNOSIS — E1151 Type 2 diabetes mellitus with diabetic peripheral angiopathy without gangrene: Secondary | ICD-10-CM | POA: Diagnosis present

## 2016-09-04 DIAGNOSIS — Z806 Family history of leukemia: Secondary | ICD-10-CM | POA: Diagnosis not present

## 2016-09-04 LAB — BASIC METABOLIC PANEL
Anion gap: 9 (ref 5–15)
BUN: 12 mg/dL (ref 6–20)
CO2: 30 mmol/L (ref 22–32)
Calcium: 9.1 mg/dL (ref 8.9–10.3)
Chloride: 101 mmol/L (ref 101–111)
Creatinine, Ser: 0.58 mg/dL (ref 0.44–1.00)
GFR calc Af Amer: >60 mL/min (ref >60)
GFR calc non Af Amer: >60 mL/min (ref >60)
Glucose, Bld: 166 mg/dL — ABNORMAL HIGH (ref 65–99)
Potassium: 3.7 mmol/L (ref 3.5–5.1)
Sodium: 140 mmol/L (ref 135–145)

## 2016-09-04 LAB — CBC
HCT: 30.3 % — ABNORMAL LOW (ref 36.0–46.0)
Hemoglobin: 10.3 g/dL — ABNORMAL LOW (ref 12.0–15.0)
MCH: 29.3 pg (ref 26.0–34.0)
MCHC: 34 g/dL (ref 30.0–36.0)
MCV: 86.3 fL (ref 78.0–100.0)
Platelets: 587 10*3/uL — ABNORMAL HIGH (ref 150–400)
RBC: 3.51 MIL/uL — ABNORMAL LOW (ref 3.87–5.11)
RDW: 12.5 % (ref 11.5–15.5)
WBC: 9.5 10*3/uL (ref 4.0–10.5)

## 2016-09-04 LAB — GLUCOSE, CAPILLARY
Glucose-Capillary: 165 mg/dL — ABNORMAL HIGH (ref 65–99)
Glucose-Capillary: 142 mg/dL — ABNORMAL HIGH (ref 65–99)
Glucose-Capillary: 50 mg/dL — ABNORMAL LOW (ref 65–99)
Glucose-Capillary: 128 mg/dL — ABNORMAL HIGH (ref 65–99)
Glucose-Capillary: 91 mg/dL (ref 65–99)
Glucose-Capillary: 174 mg/dL — ABNORMAL HIGH (ref 65–99)

## 2016-09-04 LAB — SEDIMENTATION RATE: Sed Rate: 107 mm/hr — ABNORMAL HIGH (ref 0–22)

## 2016-09-04 LAB — PREALBUMIN: Prealbumin: 12.7 mg/dL — ABNORMAL LOW (ref 18–38)

## 2016-09-04 LAB — HIV ANTIBODY (ROUTINE TESTING W REFLEX): HIV Screen 4th Generation wRfx: NONREACTIVE

## 2016-09-04 LAB — C-REACTIVE PROTEIN: CRP: 7.2 mg/dL — ABNORMAL HIGH (ref <1.0)

## 2016-09-04 MED ORDER — MAGIC MOUTHWASH W/LIDOCAINE
5.0000 mL | Freq: Four times a day (QID) | ORAL | Status: DC
  Administered 2016-09-04 – 2016-09-06 (×6): 5 mL via ORAL
  Filled 2016-09-04 (×11): qty 5

## 2016-09-04 MED ORDER — GADOBENATE DIMEGLUMINE 529 MG/ML IV SOLN
13.0000 mL | Freq: Once | INTRAVENOUS | Status: AC | PRN
  Administered 2016-09-04: 13 mL via INTRAVENOUS

## 2016-09-04 MED ORDER — ADULT MULTIVITAMIN W/MINERALS CH
1.0000 | ORAL_TABLET | Freq: Every day | ORAL | Status: DC
  Administered 2016-09-04 – 2016-09-06 (×3): 1 via ORAL
  Filled 2016-09-04 (×3): qty 1

## 2016-09-04 MED ORDER — PREMIER PROTEIN SHAKE
11.0000 [oz_av] | ORAL | Status: DC
  Administered 2016-09-04 – 2016-09-05 (×2): 11 [oz_av] via ORAL
  Filled 2016-09-04 (×2): qty 325.31

## 2016-09-04 NOTE — Progress Notes (Signed)
Inpatient Diabetes Program Recommendations  AACE/ADA: New Consensus Statement on Inpatient Glycemic Control (2015)  Target Ranges:  Prepandial:   less than 140 mg/dL      Peak postprandial:   less than 180 mg/dL (1-2 hours)      Critically ill patients:  140 - 180 mg/dL   Results for [name], [name] (MRN [id number]) as of 09/04/2016 11:07  Ref. Range 09/03/2016 20:48 09/03/2016 23:35 09/04/2016 07:49  Glucose-Capillary Latest Ref Range: 65 - 99 mg/dL 188 (H) 137 (H) 165 (H)    Admit: Diabetic foot infection/cellulitis left foot  History: DM  Home DM Meds: Lantus 50 units BID        Metformin 1000 mg AM/ 500 mg Lunch/ 1000 mg PM  Current Insulin Orders: Lantus 50 units BID      Novolog Moderate Correction Scale/ SSI (0-15 units) TID AC + HS      Novolog 4 units TID with meals      Note Lantus started last night at midnight.  Novolog started this AM.  Ortho Team has been consulted.  CBGs well controlled so far.  Current A1c pending.    --Will follow patient during hospitalization--  Wyn Quaker RN, MSN, CDE Diabetes Coordinator Inpatient Glycemic Control Team Team Pager: [phone] (8a-5p)

## 2016-09-04 NOTE — Progress Notes (Signed)
Pharmacy Antibiotic Note  [name] is a 50 y.o. female with hx of DM with neropathy, HTN, PVD and CVA presents with left toe swelling, redness and oozing admitted on 09/03/2016 with suspected diabetic foot infection.  Pharmacy has been consulted for vancomycin dosing.  Plan: Rocephin 2Gm IV q24h (MD) Flagyl 500 mg q8h (MD) Vancomycin 750 mg IV q12h  VT=15-20 mg/L  Height: 5\' 2"  (157.5 cm) Weight: 145 lb (65.8 kg) IBW/kg (Calculated) : 50.1  Temp (24hrs), Avg:98.9 F (37.2 C), Min:98.4 F (36.9 C), Max:99.3 F (37.4 C)   Recent Labs Lab 08/28/16 0826 08/29/16 1052 08/29/16 1059 09/03/16 1751 09/03/16 1802 09/03/16 2111  WBC 14.4*  --  11.1* 9.4  --   --   CREATININE  --   --  0.62 0.61  --   --   LATICACIDVEN  --  0.88  --   --  1.17 0.89    Estimated Creatinine Clearance: 73.2 mL/min (by C-G formula based on SCr of 0.61 mg/dL).    Allergies  Allergen Reactions  . Lyrica [Pregabalin] Other (See Comments)    MADE PATIENT VERY EMOTIONAL AND WOULD CRY EASILY  . Tramadol Nausea And Vomiting    Pt cant tolerate this pain med.   . Morphine And Related Nausea And Vomiting    Antimicrobials this admission: 12/27 clindamycin >> x1 ED 12/27  rocephin >> 12/27 flagyl >> 12/28 vancomcyin >>   Dose adjustments this admission:   Microbiology results:  BCx:   UCx:    Sputum:    MRSA PCR:   Thank you for allowing pharmacy to be a part of this patient's care.  Dorrene German 09/04/2016 12:24 AM

## 2016-09-04 NOTE — Consult Note (Signed)
[facility] Nurse wound consult note Reason for Consult:Right great toe full thickness ulcer with inflammation/indication of infectious process. There is pain in a previously insensate foot that is indicative of infection. Wound type:infectious Pressure Ulcer POA: No Measurement:1.5cm x 2.2cm with depth unable to be determined due to black necrotic tissue obscuring wound bed Wound bed:As described above Drainage (amount, consistency, odor) scant serous Periwound: Entire RGT is inflamed, there is a yellow/white discoloration on the medial and anterior aspects, erythema is somewhat less after systemic antibiotics, but still present.   Dressing procedure/placement/frequency: I am able to assess with Dr. Carles Collet and agree with his POC that includes MRI to rule out osteomyelitis, continuation of abntibiotics and referral to inpatient orthopedics team to evaluate for salvageability of digit. I have provided Nursing with guidance for conservative topical care of the toe via the Orders. [place] team will defer to ortho MD's expertise and not follow, but remain available as needed. Please re-consult if needed. Thanks, Maudie Flakes, MSN, RN, [place], Arther Abbott  Pager# [phone]

## 2016-09-04 NOTE — Progress Notes (Signed)
Initial Nutrition Assessment  DOCUMENTATION CODES:   Not applicable  INTERVENTION:  Provide Premier Protein po once daily, each supplement provides 100 kcal and 30 grams of protein.   Provide multivitamin with minerals daily.  NUTRITION DIAGNOSIS:   Increased nutrient needs related to wound healing as evidenced by estimated needs.  GOAL:   Patient will meet greater than or equal to 90% of their needs  MONITOR:   PO intake, Supplement acceptance, Labs, Weight trends, I & O's  REASON FOR ASSESSMENT:   Consult Wound healing  ASSESSMENT:   50 y.o. female with medical history significant of PVD, diabetic neuropathy, HTN, HLD presenting with a toe infection.   Patient was out of room at time of assessment, but spoke with husband at bedside. He reports patient got this ulcer on 12/19. He reports patient has very good appetite and intake. She eats 3 meals per day and does carbohydrate counting. She monitors her CBGs at least twice daily and tracks them in a log. Denies N/V, abdominal pain, or constipation/diarrhea.   Reports UBW 146 lbs and that patient has been weight stable.   Medications reviewed and include: ceftriaxone, metronidazole, Colace, Novolog sliding scale TID with meals and daily at bedtime, Novolog 4 units TID with meals, Lantus 50 units BID, magic mouthwash QID, vancomycin, LR @ 50 ml/hr.  Labs reviewed: CBG 137-188 past 24 hrs.   Unable to complete Nutrition-Focused physical exam at this time.   Diet Order:  Diet Carb Modified Fluid consistency: Thin; Room service appropriate? Yes  Skin:  Wound (see comment) (Diabetic wound left toe)  Last BM:  09/03/2016  Height:   Ht Readings from Last 1 Encounters:  09/03/16 5\' 2"  (1.575 m)    Weight:   Wt Readings from Last 1 Encounters:  09/03/16 145 lb (65.8 kg)    Ideal Body Weight:  50 kg  BMI:  Body mass index is 26.52 kg/m.  Estimated Nutritional Needs:   Kcal:  1470-1715 (MSJ x  1.2-1.4)  Protein:  80-92 grams (1.2-1.4 grams/kg)  Fluid:  >/= 1.9 L/day (30 ml/kg)  EDUCATION NEEDS:   No education needs identified at this time  Willey Blade, MS, RD, LDN Pager: [phone] After Hours Pager: [phone]

## 2016-09-04 NOTE — Progress Notes (Signed)
CSW consulted to assist wiht accessing meds at d/c. CSW is unable to assist with this request. RNCM has been consulted as well. In some cases RNCM is able to assist.  CSW signing off.  Werner Lean LCSW [phone]

## 2016-09-04 NOTE — Progress Notes (Signed)
Patient arrived back on floor from MRI, patient stated she felt sweaty and had visual disturbances. Blood sugar checked by Lanelle Bal, CNA and was 50. Patient drank 40z of orange juice,food tray arrived that patient had pre-ordered. She ate more than 50% of her tray, 15 minutes later, blood sugar was 91. Patient drank a second 4oz of orange juice, waited 15 minutes and recheck blood sugar, it was 128.

## 2016-09-04 NOTE — Progress Notes (Addendum)
PROGRESS NOTE  [name] HTX:[id number] DOB: [birth year] DOA: 09/03/2016 PCP: Penni Homans, MD  Brief History:  50 year old female with a history of diabetes mellitus, hyperlipidemia, hypertension, peripheral vascular disease, and intracerebral hemorrhage presented with nearly 2 week history of left diabetic foot infection. The patient initially noted a cut on her left great toe while working in the yard on 08/26/2016. On the same day, she went to see her primary Davis provider, Dr. Charlett Blake, on the same day, and was noted to have a temperature 101.15F. She was sent to the emergency department on the same day where a bedside debridement was performed. She was discharged from the emergency department with Bactrim and cephalexin. She returned to the emergency department on 08/28/2016 secondary to a reported positive blood culture. The patient was noted to be clinically stable, and her blood culture was thought to be a contaminant. The patient was discharged home with clindamycin in addition to her Bactrim and cephalexin.. She'll follow-up with her primary Davis provider on 09/02/2016 and was referred to [facility] after receiving intramuscular ceftriaxone. The patient saw Dr. [name] requested admission to the hospital.  Assessment/Plan: Diabetic foot infection/cellulitis left foot -Continue vancomycin, metronidazole, ceftriaxone -ESR 107 -check CRP -MRI left foot -reconsult ortho--called [facility] office--spoke with Dr. Rhina Brackett  Diabetes mellitus type 2, uncontrolled -Continue reduced dose Lantus -NovoLog sliding scale -Hemoglobin A1c -hold metformin  Hypertension -Continue lisinopril  Hyperlipidemia -Continue statin  Pyuria -continue ceftriaxone pending culture data   Disposition Plan:   Not stable for discharge Family Communication:   Spouse updated at bedside--Total time spent 35 minutes.  Greater than 50% spent face to face counseling and  coordinating Davis.  Consultants:  Ortho  Code Status:  FULL  DVT Prophylaxis:  [place] Lovenox   Procedures: As Listed in Progress Note Above  Antibiotics: vanco 12/27>> Ceftriaxone 12/27>> Metro 12/27>>>    Subjective: Patient denies fevers, chills, headache, chest pain, dyspnea, nausea, vomiting, diarrhea, abdominal pain, dysuria, hematuria, hematochezia, and melena.   Objective: Vitals:   09/03/16 2210 09/03/16 2212 09/03/16 2345 09/04/16 0549  BP: 160/97  136/73 (!) 148/84  Pulse: 91  87 88  Resp: _0 Temp:   98.4 F (36.9 C) 98.6 F (37 C)  TempSrc:   Oral Oral  SpO2: 99%  100% 100%  Weight:  65.8 kg (145 lb)    Height:  _1  (1.575 m)      Intake/Output Summary (Last 24 hours) at 09/04/16 3202 Last data filed at 09/04/16 0600  Gross per 24 hour  Intake           940.83 ml  Output              650 ml  Net           290.83 ml   Weight change:  Exam:   General:  Pt is alert, follows commands appropriately, not in acute distress  HEENT: No icterus, No thrush, No neck mass, [place]/AT  Cardiovascular: RRR, S1/S2, no rubs, no gallops  Respiratory: CTA bilaterally, no wheezing, no crackles, no rhonchi  Abdomen: Soft/+BS, non tender, non distended, no guarding  Extremities: No edema, No lymphangitis, No petechiae, No rashes, no synovitis; left hallux with necrotic tissue on plantar aspect with erythema and edema up to first MTPJ   Data Reviewed: I have personally reviewed following labs and imaging studies Basic Metabolic Panel:  Recent Labs Lab 08/29/16 1059  09/03/16 1751 09/04/16 0407  NA 136 137 140  K 3.6 3.7 3.7  CL 103 101 101  CO2 _0 GLUCOSE 185* 272* 166*  BUN _1 CREATININE 0.62 0.61 0.58  CALCIUM 8.8* 9.3 9.1   Liver Function Tests:  Recent Labs Lab 08/29/16 1059 09/03/16 1751  AST 22 15  ALT 23 17  ALKPHOS 89 84  BILITOT 0.5 0.3  PROT 7.2 7.4  ALBUMIN 3.2* 3.0*   No results for input(s): LIPASE, AMYLASE  in the last 168 hours. No results for input(s): AMMONIA in the last 168 hours. Coagulation Profile: No results for input(s): INR, PROTIME in the last 168 hours. CBC:  Recent Labs Lab 08/29/16 1059 09/03/16 1751 09/04/16 0407  WBC 11.1* 9.4 9.5  NEUTROABS 7.9* 6.5  --   HGB 10.3* 10.1* 10.3*  HCT 30.7* 29.7* 30.3*  MCV 87.5 85.8 86.3  PLT 374 578* 587*   Cardiac Enzymes: No results for input(s): CKTOTAL, CKMB, CKMBINDEX, TROPONINI in the last 168 hours. BNP: Invalid input(s): POCBNP CBG:  Recent Labs Lab 08/29/16 1004 09/03/16 2048 09/03/16 2335 09/04/16 0749  GLUCAP 150* 188* 137* 165*   HbA1C: No results for input(s): HGBA1C in the last 72 hours. Urine analysis:    Component Value Date/Time   COLORURINE YELLOW 09/03/2016 2113   APPEARANCEUR HAZY (A) 09/03/2016 2113   LABSPEC 1.018 09/03/2016 2113   PHURINE 5.0 09/03/2016 2113   GLUCOSEU NEGATIVE 09/03/2016 2113   GLUCOSEU NEGATIVE 05/21/2015 0831   HGBUR SMALL (A) 09/03/2016 2113   BILIRUBINUR NEGATIVE 09/03/2016 2113   [place] NEGATIVE 09/03/2016 2113   PROTEINUR 100 (A) 09/03/2016 2113   UROBILINOGEN 0.2 05/21/2015 0831   NITRITE NEGATIVE 09/03/2016 2113   LEUKOCYTESUR MODERATE (A) 09/03/2016 2113   Sepsis Labs: _2 (procalcitonin:4,lacticidven:4) ) Recent Results (from the past 240 hour(s))  Blood culture (routine x 2)     Status: Abnormal   Collection Time: 08/26/16  4:25 PM  Result Value Ref Range Status   Specimen Description BLOOD RIGHT FOREARM  Final   Special Requests BOTTLES DRAWN AEROBIC AND ANAEROBIC 5ML  Final   Culture  Setup Time   Final    GRAM VARIABLE ROD ANAEROBIC BOTTLE ONLY CRITICAL RESULT CALLED TO, READ BACK BY AND VERIFIED WITH: Rosemarie Ax RN 13:30 08/27/16 ([email or web address])    Culture (A)  Final    BACILLUS SPECIES Standardized susceptibility testing for this organism is not available. Performed at [facility]    Report Status 08/29/2016 FINAL  Final  Blood Culture  ID Panel (Reflexed)     Status: None   Collection Time: 08/26/16  4:25 PM  Result Value Ref Range Status   Enterococcus species NOT DETECTED NOT DETECTED Final   Listeria monocytogenes NOT DETECTED NOT DETECTED Final   Staphylococcus species NOT DETECTED NOT DETECTED Final   Staphylococcus aureus NOT DETECTED NOT DETECTED Final   Streptococcus species NOT DETECTED NOT DETECTED Final   Streptococcus agalactiae NOT DETECTED NOT DETECTED Final   Streptococcus pneumoniae NOT DETECTED NOT DETECTED Final   Streptococcus pyogenes NOT DETECTED NOT DETECTED Final   Acinetobacter baumannii NOT DETECTED NOT DETECTED Final   Enterobacteriaceae species NOT DETECTED NOT DETECTED Final   Enterobacter cloacae complex NOT DETECTED NOT DETECTED Final   Escherichia coli NOT DETECTED NOT DETECTED Final   Klebsiella oxytoca NOT DETECTED NOT DETECTED Final   Klebsiella pneumoniae NOT DETECTED NOT DETECTED Final   Proteus species NOT DETECTED NOT DETECTED Final   Serratia marcescens  NOT DETECTED NOT DETECTED Final   Haemophilus influenzae NOT DETECTED NOT DETECTED Final   Neisseria meningitidis NOT DETECTED NOT DETECTED Final   Pseudomonas aeruginosa NOT DETECTED NOT DETECTED Final   Candida albicans NOT DETECTED NOT DETECTED Final   Candida glabrata NOT DETECTED NOT DETECTED Final   Candida krusei NOT DETECTED NOT DETECTED Final   Candida parapsilosis NOT DETECTED NOT DETECTED Final   Candida tropicalis NOT DETECTED NOT DETECTED Final    Comment: Performed at [facility]  Blood culture (routine x 2)     Status: None   Collection Time: 08/26/16  4:28 PM  Result Value Ref Range Status   Specimen Description BLOOD LEFT HAND  Final   Special Requests IN PEDIATRIC BOTTLE 5.5ML  Final   Culture   Final    NO GROWTH 5 DAYS Performed at [facility]    Report Status 08/31/2016 FINAL  Final  Blood culture (routine x 2)     Status: None   Collection Time: 08/29/16 10:50 AM  Result Value Ref  Range Status   Specimen Description BLOOD RIGHT ANTECUBITAL  Final   Special Requests IN PEDIATRIC BOTTLE [facility]  Final   Culture   Final    NO GROWTH 5 DAYS Performed at [facility]    Report Status 09/03/2016 FINAL  Final  Blood culture (routine x 2)     Status: None   Collection Time: 08/29/16 10:55 AM  Result Value Ref Range Status   Specimen Description BLOOD RIGHT HAND  Final   Special Requests BOTTLES DRAWN AEROBIC AND ANAEROBIC 4CC  Final   Culture   Final    NO GROWTH 5 DAYS Performed at [facility]    Report Status 09/03/2016 FINAL  Final     Scheduled Meds: . aspirin EC  81 mg Oral Daily  . cefTRIAXone (ROCEPHIN)  IV  2 g Intravenous QHS   And  . metroNIDAZOLE  500 mg Oral Q8H  . docusate sodium  100 mg Oral BID  . enoxaparin (LOVENOX) injection  40 mg Subcutaneous QHS  . Influenza vac split quadrivalent PF  0.5 mL Intramuscular Tomorrow-1000  . insulin aspart  0-15 Units Subcutaneous TID WC  . insulin aspart  0-5 Units Subcutaneous QHS  . insulin aspart  4 Units Subcutaneous TID WC  . insulin glargine  50 Units Subcutaneous BID  . lisinopril  10 mg Oral Daily  . magic mouthwash w/lidocaine  5 mL Oral QID  . simvastatin  5 mg Oral q1800  . vancomycin  750 mg Intravenous Q12H   Continuous Infusions: . lactated ringers 50 mL/hr at 09/03/16 2347    Procedures/Studies: Dg Chest 2 View  Result Date: 08/26/2016 CLINICAL DATA:  Congestion.  Fever. EXAM: CHEST  2 VIEW COMPARISON:  11/05/2002 report. FINDINGS: Mediastinum hilar structures normal. Mild cardiomegaly. Low lung volumes. No focal infiltrate. No pleural effusion or pneumothorax. IMPRESSION: 1.  Mild cardiomegaly.  2. Low lung volumes.  No focal infiltrate. Electronically Signed   By: Marcello Moores  Register   On: 08/26/2016 17:19   US Venous Img Lower Unilateral Left  Result Date: 09/02/2016 CLINICAL DATA:  Left foot and calf pain. EXAM: LEFT LOWER EXTREMITY VENOUS DOPPLER ULTRASOUND TECHNIQUE:  Gray-scale sonography with graded compression, as well as color Doppler and duplex ultrasound, were performed to evaluate the deep venous system from the level of the common femoral vein through the popliteal and proximal calf veins. Spectral Doppler was utilized to evaluate flow at rest  and with distal augmentation maneuvers. COMPARISON:  None. FINDINGS: Right common femoral vein is patent without thrombus. Normal compressibility, augmentation and color Doppler flow in the left common femoral vein, left femoral vein and left popliteal vein. The left saphenofemoral junction is patent. Left profunda femoral vein is patent without thrombus. Visualized left deep calf veins are patent without thrombus. Left great saphenous vein is compressible without thrombus. IMPRESSION: Negative for deep venous thrombosis in left lower extremity. Electronically Signed   By: Markus Daft M.D.   On: 09/02/2016 14:43   Dg Foot Complete Left  Result Date: 08/29/2016 CLINICAL DATA:  Cut on the bottom of the big toe with swelling and redness. EXAM: LEFT FOOT - COMPLETE 3+ VIEW COMPARISON:  None. FINDINGS: There is soft tissue swelling involving the great toe. There is no evidence for a fracture. No evidence for cortical destruction or periosteal reaction. IMPRESSION: No acute bone abnormality involving the left foot. Soft tissue swelling involving the great toe. Electronically Signed   By: Markus Daft M.D.   On: 08/29/2016 12:07   Dg Toe Great Left  Result Date: 09/03/2016 CLINICAL DATA:  Subacute onset of left plantar great toe wound, with erythema and swelling. Initial encounter. EXAM: LEFT GREAT TOE COMPARISON:  Left foot radiographs performed 08/29/2016 FINDINGS: There is no evidence of fracture or dislocation. The joint spaces are preserved. There is a bipartite medial sesamoid of the great toe. Soft tissue swelling is noted about the great toe. The known soft tissue wound is not well characterized on radiograph. No radiopaque  foreign bodies are seen. IMPRESSION: 1. No evidence of fracture or dislocation. 2. Bipartite medial sesamoid of the great toe. 3. Soft tissue swelling about the great toe. Electronically Signed   By: Garald Balding M.D.   On: 09/03/2016 20:19    TAT, DAVID, DO  [facility] Pager [phone]  If 7PM-7AM, please contact night-coverage [email or web address] Password [email or web address] 09/04/2016, 9:07 AM   LOS: 0 days

## 2016-09-05 ENCOUNTER — Telehealth: Payer: Self-pay | Admitting: Family Medicine

## 2016-09-05 DIAGNOSIS — M86172 Other acute osteomyelitis, left ankle and foot: Secondary | ICD-10-CM

## 2016-09-05 HISTORY — DX: Other acute osteomyelitis, left ankle and foot: M86.172

## 2016-09-05 LAB — HEMOGLOBIN A1C
Hgb A1c MFr Bld: 10.4 % — ABNORMAL HIGH (ref 4.8–5.6)
Mean Plasma Glucose: 252 mg/dL

## 2016-09-05 LAB — GLUCOSE, CAPILLARY
Glucose-Capillary: 85 mg/dL (ref 65–99)
Glucose-Capillary: 166 mg/dL — ABNORMAL HIGH (ref 65–99)
Glucose-Capillary: 85 mg/dL (ref 65–99)
Glucose-Capillary: 103 mg/dL — ABNORMAL HIGH (ref 65–99)
Glucose-Capillary: 192 mg/dL — ABNORMAL HIGH (ref 65–99)

## 2016-09-05 LAB — CBC
HCT: 31.1 % — ABNORMAL LOW (ref 36.0–46.0)
Hemoglobin: 10.3 g/dL — ABNORMAL LOW (ref 12.0–15.0)
MCH: 28.7 pg (ref 26.0–34.0)
MCHC: 33.1 g/dL (ref 30.0–36.0)
MCV: 86.6 fL (ref 78.0–100.0)
Platelets: 609 10*3/uL — ABNORMAL HIGH (ref 150–400)
RBC: 3.59 MIL/uL — ABNORMAL LOW (ref 3.87–5.11)
RDW: 12.2 % (ref 11.5–15.5)
WBC: 8.5 10*3/uL (ref 4.0–10.5)

## 2016-09-05 LAB — SURGICAL PCR SCREEN
MRSA, PCR: NEGATIVE
Staphylococcus aureus: NEGATIVE

## 2016-09-05 LAB — URINE CULTURE: Culture: NO GROWTH

## 2016-09-05 LAB — VANCOMYCIN, TROUGH: Vancomycin Tr: 10 ug/mL — ABNORMAL LOW (ref 15–20)

## 2016-09-05 MED ORDER — LISINOPRIL 20 MG PO TABS
20.0000 mg | ORAL_TABLET | Freq: Every day | ORAL | Status: DC
  Administered 2016-09-06: 20 mg via ORAL
  Filled 2016-09-05: qty 1

## 2016-09-05 MED ORDER — LISINOPRIL 10 MG PO TABS
10.0000 mg | ORAL_TABLET | Freq: Once | ORAL | Status: AC
  Administered 2016-09-05: 10 mg via ORAL
  Filled 2016-09-05: qty 1

## 2016-09-05 MED ORDER — LIVING WELL WITH DIABETES BOOK
Freq: Once | Status: AC
  Administered 2016-09-05: 09:00:00
  Filled 2016-09-05: qty 1

## 2016-09-05 MED ORDER — INSULIN GLARGINE 100 UNIT/ML ~~LOC~~ SOLN
45.0000 [IU] | Freq: Two times a day (BID) | SUBCUTANEOUS | Status: DC
  Administered 2016-09-05 – 2016-09-06 (×2): 45 [IU] via SUBCUTANEOUS
  Filled 2016-09-05 (×4): qty 0.45

## 2016-09-05 MED ORDER — SODIUM CHLORIDE 0.9 % IV SOLN
1250.0000 mg | Freq: Two times a day (BID) | INTRAVENOUS | Status: DC
  Administered 2016-09-06: 1250 mg via INTRAVENOUS
  Filled 2016-09-05 (×2): qty 1250

## 2016-09-05 NOTE — Telephone Encounter (Signed)
Do not charge  

## 2016-09-05 NOTE — Consult Note (Signed)
ORTHOPAEDIC CONSULTATION HISTORY & PHYSICAL REQUESTING PHYSICIAN: Orson Eva, MD  Chief Complaint: right toe osteomyelitis  HPI: HPI: 50 year old female with a history of diabetes mellitus, hyperlipidemia, hypertension, peripheral vascular disease, and intracerebral hemorrhage presented with 2 week history of left foot infection. The patient initially noted split skin on the bottom of her left great toe after raking leaves in the yard on 08/26/2016. She proceeded to her primary care provider, Dr. [name] that day and was noted to have a temperature 101.50F. She was sent to the emergency department on the same day where a bedside debridement was performed. She was discharged on Bactrim and cephalexin. She returnedto the emergency department on 08/28/2016 for reported positive blood culture. The patient was noted to be clinically stable, and her blood culture was thought to be a contaminant. The patient was discharged home with clindamycin in addition to her Bactrim and cephalexin. She then followed-up with her PCP on 09/02/2016, not improving, and was admitted to the hospital for continued eval and care.  MRI was c/w osteomyelitis of the distal phalanx of the left great toe.  She was evaluated yesterday by Dr. Lynann Bologna from our practice.Interestingly, 3 years ago she had protracted wound care for spontaneously developing lesion on the anterolateral aspect of the left leg, about a third of the way from the ankle towards the knee.  Earlier this year, she underwent something similar with almost identical localization on the right side.  Past Medical History:  Diagnosis Date  . Acute bronchitis 07/08/2011  . COMMON MIGRAINE 10/07/2010  . Depression 12/18/2012  . Diabetes mellitus type II, uncontrolled ([place]) 10/07/2010   Qualifier: Diagnosis of  By: Charlett Blake MD, [name]    . Diabetic foot infection ([place]) 08/26/2016  . Disturbance of skin sensation 10/07/2010  . HEART MURMUR, HX OF 10/07/2010  . Hyperlipidemia  12/06/2010  . Hypertension   . NEPHROLITHIASIS, HX OF 10/07/2010  . Overweight(278.02) 12/06/2010  . PERIPHERAL NEUROPATHY, FEET 10/07/2010  . PVD (peripheral vascular disease) ([place]) 01/21/2012  . RESTLESS LEG SYNDROME 10/25/2010  . SOB (shortness of breath) 04/01/2012  . Stroke [facility]) 2014, 2017   most recently in 2/17 - intracerebral hemorrhage  . Weakness of right side of body 04/19/2013   Past Surgical History:  Procedure Laterality Date  . WISDOM TOOTH EXTRACTION     Social History   Social History  . Marital status: Single    Spouse name: N/A  . Number of children: N/A  . Years of education: N/A   Occupational History  . machine operator    Social History Main Topics  . Smoking status: Former Smoker    Packs/day: 0.50    Quit date: 09/08/2005  . Smokeless tobacco: Never Used  . Alcohol use No  . Drug use: No  . Sexual activity: Yes    Partners: Male    Birth control/ protection: None   Other Topics Concern  . None   Social History Narrative   Lives with husband in a two story home.  Has one child.     Works as a Glass blower/designer.     Education: high school.   Family History  Problem Relation Age of Onset  . Arthritis Mother   . Stroke Brother     previous smoker  . Alcohol abuse Brother     in remission  . Leukemia Brother   . Diabetes Paternal Grandmother   . Healthy Son    Allergies  Allergen Reactions  . Lyrica [Pregabalin] Other (See Comments)  MADE PATIENT VERY EMOTIONAL AND WOULD CRY EASILY  . Tramadol Nausea And Vomiting    Pt cant tolerate this pain med.   . Morphine And Related Nausea And Vomiting   Prior to Admission medications   Medication Sig Start Date End Date Taking? Authorizing Provider  aspirin EC 81 MG tablet Take 81 mg by mouth daily.   Yes Historical Provider, MD  Insulin Glargine (LANTUS SOLOSTAR) 100 UNIT/ML Solostar Pen Inject 50 Units into the skin 2 (two) times daily. 10/09/15  Yes Mosie Lukes, MD  lisinopril  (PRINIVIL,ZESTRIL) 10 MG tablet TAKE 1 TABLET (10 MG TOTAL) BY MOUTH 2 (TWO) TIMES DAILY. 04/01/16  Yes Mosie Lukes, MD  metFORMIN (GLUCOPHAGE) 1000 MG tablet TAKE 1 TABLET TWICE DAILY AND 1/2 TABLET AT NOON 04/01/16  Yes Mosie Lukes, MD  simvastatin (ZOCOR) 5 MG tablet Take 1 tablet (5 mg total) by mouth daily. 05/19/16  Yes Mosie Lukes, MD  glucose blood (ONE TOUCH ULTRA TEST) test strip Use once daily to check blood sugar.  DX E11.9 01/04/16   Mosie Lukes, MD  Insulin Pen Needle 32G X 4 MM MISC Use as directed for insulin.DX E11.65 03/03/16   Mosie Lukes, MD  Lancets (FREESTYLE) lancets DX- 250.02  Pts machine is Freestyle Freedom Lite 04/20/13   Mosie Lukes, MD   Mr Foot Left W Wo Contrast  Result Date: 09/04/2016 CLINICAL DATA:  Large open wound on the plantar aspect of the great toe. EXAM: MRI OF THE LEFT FOREFOOT WITHOUT AND WITH CONTRAST TECHNIQUE: Multiplanar, multisequence MR imaging of the left foot was performed both before and after administration of intravenous contrast. CONTRAST:  6mL MULTIHANCE GADOBENATE DIMEGLUMINE 529 MG/ML IV SOLN COMPARISON:  Radiographs 09/03/2016 FINDINGS: There is a large open wound involving the plantar aspect of the great toe. There is associated moderate diffuse subcutaneous soft tissue swelling/edema and enhancement suggesting cellulitis. The actual wound at itself does not demonstrate any enhancement suggesting necrotic tissue. Underlying edema like marrow signal abnormality and enhancement of distal phalanx is consistent with osteomyelitis. No definite findings for septic arthritis, septic tenosynovitis or pyomyositis. Moderate diffuse myofasciitis. Mild diffuse signal abnormality in the second metatarsal shaft. This may be some type of stress reaction. I do not see any significant enhancement. I doubt this is osteomyelitis. It is more likely a stress reaction. IMPRESSION: 1. Large open wound involving the plantar aspect of the great toe.  Nonenhancement of the tissue in the wound suggests tissue necrosis. 2. Osteomyelitis involving the distal phalanx of the great toe. 3. Nonspecific signal abnormality in the second metatarsal shaft. I think this is unlikely osteomyelitis and more likely some type of stress reaction but post observation is suggested. 4. No discrete drainable soft tissue abscess or pyomyositis. Electronically Signed   By: Marijo Sanes M.D.   On: 09/04/2016 18:33    Positive ROS: All other systems have been reviewed and were otherwise negative with the exception of those mentioned in the HPI and as above.  Physical Exam: Vitals: Refer to EMR. Constitutional:  WD, WN, NAD HEENT:  NCAT, EOMI Neuro/Psych:  Alert & oriented to person, place, and time; appropriate mood & affect Lymphatic: No generalized extremity edema or lymphadenopathy Extremities / MSK:  The extremities are normal with respect to appearance, ranges of motion, joint stability, muscle strength/tone, sensation, & perfusion except as otherwise noted:  Left great toe with 3cm area of full-thickness necrotic skin on the plantar surface, centered over the proximal  phalanx.  The skin distal to that towards the tip is not necrotic, but there is some partial-thickness skin loss with developing blistering wrapping around the toe on its lateral side.  The toe is swollen, which extends into the midfoot and forefoot, but by her report is much less than previously.  There is also some spreading redness from the toe proximally, which seems to have receded from the skin markings made at one point.  Dorsalis pedis pulse is fairly strongly palpable.  Assessment: Left great toe osteomyelitis of the distal phalanx with rather large area of full-thickness skin necrosis on the plantar surface, with overlying and more proximal cellulitis/subcutaneous edema  Plan: I discussed these findings with her.  We discussed the pros and cons of options.  There are 2 sets of problems  impacting resolution, one is eradication of the infection, and the other is how to deal with the ongoing skin necrosis.  I indicated to her that I think it is very unlikely that she can achieve resolution of both portions of this problem with just IV antibiotics alone, and limited debridement is also unlikely to be successful.  I recommended she consider amputation of the hallux at this time, as it will even be challenging to close suction amputation primarily as it is, and any further skin loss might make that prohibitive, requiring some partial amputation of the metatarsal.  After our discussion, she indicated that she would like to proceed with amputation of the great toe.  I have recommended that we allow a few more days for medical treatment for her ascending cellulitis/lymphangitis to further improve that, and plan to proceed with outpatient left great toe amputation at the [facility] on Tuesday.  She consented to proceed.  I would think that she could be discharged on appropriate oral antibiotic regimen so long as she continues to improve clinically, either late on Saturday or Sunday.  I instructed her to be NPO after midnight on Monday night, to take only half of her normal morning insulin dose Tuesday, and to arrive at the surgery center at 10:30 AM on Tuesday.  Rayvon Char [place], [place],   [id number] Office: [phone] Mobile: [phone]  09/05/2016, 10:37 PM

## 2016-09-05 NOTE — Telephone Encounter (Signed)
FYI:  [facility] called to inform PCP that patient was hospitalized on 12/27. Patient informed them that she had an appointment on 12/28 and did not want to be charged a No Show fee. Patient will call to schedule Hospital Follow up when she is discharged.

## 2016-09-05 NOTE — Progress Notes (Signed)
Pharmacy Antibiotic Note  [name] is a 50 y.o. female with hx of DM with neropathy, HTN, PVD and CVA presents with left toe swelling, redness and oozing admitted on 09/03/2016 with suspected diabetic foot infection and now confirmed L great toe osteomyelitis with plan for amputation per notes.  Pharmacy has been consulted for vancomycin dosing.  Plan: Continue Rocephin 2Gm IV q24h (MD) Continue Flagyl 500 mg q8h (MD) Continue Vancomycin 750 mg IV q12h  VT=15-20 mg/L Check vanc trough tonight prior to 10pm dose which will be prior to 5th overall dose of vanc  Height: 5\' 2"  (157.5 cm) Weight: 145 lb (65.8 kg) IBW/kg (Calculated) : 50.1  Temp (24hrs), Avg:98.4 F (36.9 C), Min:98 F (36.7 C), Max:98.7 F (37.1 C)   Recent Labs Lab 08/29/16 1052 08/29/16 1059 09/03/16 1751 09/03/16 1802 09/03/16 2111 09/04/16 0407 09/05/16 0517  WBC  --  11.1* 9.4  --   --  9.5 8.5  CREATININE  --  0.62 0.61  --   --  0.58  --   LATICACIDVEN 0.88  --   --  1.17 0.89  --   --     Estimated Creatinine Clearance: 73.2 mL/min (by C-G formula based on SCr of 0.58 mg/dL).    Allergies  Allergen Reactions  . Lyrica [Pregabalin] Other (See Comments)    MADE PATIENT VERY EMOTIONAL AND WOULD CRY EASILY  . Tramadol Nausea And Vomiting    Pt cant tolerate this pain med.   . Morphine And Related Nausea And Vomiting    Antimicrobials this admission: 12/27 clindamycin >> x1 ED 12/27  rocephin >> 12/27 flagyl >> 12/28 vancomcyin >>   Microbiology results:  12/27 BCx: ngtd 12/27 UCx: sent  Thank you for allowing pharmacy to be a part of this patient's care.  Adrian Saran, PharmD, BCPS Pager [phone] 09/05/2016 9:33 AM

## 2016-09-05 NOTE — Progress Notes (Signed)
Brief Pharmacy Note re: Vancomycin  See complete note from Christean Grief, PharmD from earlier today  O:  Vancomycin trough = 10 on Vanc 750mg  IV q12h (goal 15-66mcg/ml)  A/P  Vanc trough sub-therapeutic.   Increase Vanc 1250mg  IV q12h Re-check Vancomycin trough at steady-state  Netta Cedars, PharmD, BCPS Pager: [phone], 09/05/2016@10 :11 PM

## 2016-09-05 NOTE — Consult Note (Signed)
Reason for Consult: [name] osteomyelitis   Referring Physician: Dr. Lucia Davis is an 50 y.o. female.   HPI: 50 year old female with a history of diabetes mellitus, hyperlipidemia, hypertension, peripheral vascular disease, and intracerebral hemorrhage presented with 2 week history of left foot infection. The patient initially noted a cut on the bottom of her left great Davis after raking leaves in the yard on 08/26/2016. She proceeded to her primary care provider, Dr. Randel Davis that day and was noted to have a temperature 101.65F. She was sent to the emergency department on the same day where a bedside debridement was performed. She was discharged on Bactrim and cephalexin. She returned to the emergency department on 08/28/2016 for reported positive blood culture. The patient was noted to be clinically stable, and her blood culture was thought to be a contaminant. The patient was discharged home with clindamycin in addition to her Bactrim and cephalexin.. She then followed-up with her PCP on 09/02/2016 and was referred to [facility] after receiving intramuscular ceftriaxone. The patient saw Dr. Rip Davis, who requested admission to the hospital. She states she does feel some bertter today, denies fever chills or body aches or systemic symptoms. She had some hypoglycemia yesterday afternoon that resolved with drinking and eating. She has had the MRI that was ordered yesterday.   Assessment/Plan:  Diabetic foot infection: cellulitis/osteomyelitis left great Davis distal phalanx plantar side  -Continue vancomycin, metronidazole, ceftriaxone for at least 24 more hours -MRI demonstrates osteo is contained to distal phalanx  -Will require amputation of great Davis on an elective basis, as patient is stable with improving cellulitis -Cont regular dressing changes - cellulitis has improved, will need likely another 24 hours of IV abx, and if continues to improve, patient can be discharged  home with follow-up on Tuesday with Dr. Berenice Davis to additionally discuss the need for an outpatient amputation. If cellulitis worsens or does not continue to improve, the patient may need to be maintained inpatient for additional IV antibiotics.  -Discussed need for amputation with patient, she is a bit teary eyed and stated she needs time to digest understandably. Family is at bedside. She will discuss further with operative MD, likely  In an outpatient setting.  Diabetes mellitus type 2, uncontrolled   -HgA1C 10.4  -Managed by primary team, we appreciate their input  Hypertension -Managed by primary team, we appreciate their input  Hyperlipidemia -Managed by primary team, we appreciate their input  Pyuria -continue ceftriaxone pending culture dat  Past Medical History:  Diagnosis Date  . Acute bronchitis 07/08/2011  . COMMON MIGRAINE 10/07/2010  . Depression 12/18/2012  . Diabetes mellitus type II, uncontrolled ([place]) 10/07/2010   Qualifier: Diagnosis of  By: Charlett Blake MD, [name]    . Diabetic foot infection ([place]) 08/26/2016  . Disturbance of skin sensation 10/07/2010  . HEART MURMUR, HX OF 10/07/2010  . Hyperlipidemia 12/06/2010  . Hypertension   . NEPHROLITHIASIS, HX OF 10/07/2010  . Overweight(278.02) 12/06/2010  . PERIPHERAL NEUROPATHY, FEET 10/07/2010  . PVD (peripheral vascular disease) ([place]) 01/21/2012  . RESTLESS LEG SYNDROME 10/25/2010  . SOB (shortness of breath) 04/01/2012  . Stroke [facility]) 2014, 2017   most recently in 2/17 - intracerebral hemorrhage  . Weakness of right side of body 04/19/2013    Past Surgical History:  Procedure Laterality Date  . WISDOM TOOTH EXTRACTION      Family History  Problem Relation Age of Onset  . Arthritis Mother   . Stroke Brother  previous smoker  . Alcohol abuse Brother     in remission  . Leukemia Brother   . Diabetes Paternal Grandmother   . Healthy Son     Social History:  reports that she quit smoking about 11 years ago. She smoked  0.50 packs per day. She has never used smokeless tobacco. She reports that she does not drink alcohol or use drugs.  Allergies:  Allergies  Allergen Reactions  . Lyrica [Pregabalin] Other (See Comments)    MADE PATIENT VERY EMOTIONAL AND WOULD CRY EASILY  . Tramadol Nausea And Vomiting    Pt cant tolerate this pain med.   . Morphine And Related Nausea And Vomiting    Medications:  Current Facility-Administered Medications (Endocrine & Metabolic):  .  insulin aspart (novoLOG) injection 0-15 Units .  insulin aspart (novoLOG) injection 0-5 Units .  insulin aspart (novoLOG) injection 4 Units .  insulin glargine (LANTUS) injection 50 Units   Current Facility-Administered Medications (Cardiovascular):  .  lisinopril (PRINIVIL,ZESTRIL) tablet 10 mg .  simvastatin (ZOCOR) tablet 5 mg   Current Facility-Administered Medications (Respiratory):  .  magic mouthwash w/lidocaine*   Current Facility-Administered Medications (Analgesics):  .  acetaminophen (TYLENOL) tablet 650 mg **OR** acetaminophen (TYLENOL) suppository 650 mg .  aspirin EC tablet 81 mg .  ketorolac (TORADOL) 30 MG/ML injection 30 mg   Current Facility-Administered Medications (Hematological):  .  enoxaparin (LOVENOX) injection 40 mg   Current Facility-Administered Medications (Other):  .  cefTRIAXone (ROCEPHIN) 2 g in dextrose 5 % 50 mL IVPB **AND** metroNIDAZOLE (FLAGYL) tablet 500 mg **AND** vancomycin per pharmacy consult .  docusate sodium (COLACE) capsule 100 mg .  Influenza vac split quadrivalent PF (FLUARIX) injection 0.5 mL .  lactated ringers infusion .  magic mouthwash w/lidocaine* .  multivitamin with minerals tablet 1 tablet .  ondansetron (ZOFRAN) tablet 4 mg **OR** ondansetron (ZOFRAN) injection 4 mg .  protein supplement (PREMIER PROTEIN) liquid .  vancomycin (VANCOCIN) IVPB 750 mg/150 ml premix  * These medications belong to multiple therapeutic classes and are listed under each applicable  group. No current outpatient prescriptions on file.  Results for orders placed or performed during the hospital encounter of 09/03/16 (from the past 48 hour(s))  Comprehensive metabolic panel     Status: Abnormal   Collection Time: 09/03/16  5:51 PM  Result Value Ref Range   Sodium 137 135 - 145 mmol/Tiffany   Potassium 3.7 3.5 - 5.1 mmol/Tiffany   Chloride 101 101 - 111 mmol/Tiffany   CO2 27 22 - 32 mmol/Tiffany   Glucose, Bld 272 (H) 65 - 99 mg/dL   BUN 14 6 - 20 mg/dL   Creatinine, Ser 0.61 0.44 - 1.00 mg/dL   Calcium 9.3 8.9 - 10.3 mg/dL   Total Protein 7.4 6.5 - 8.1 g/dL   Albumin 3.0 (Tiffany) 3.5 - 5.0 g/dL   AST 15 15 - 41 U/Tiffany   ALT 17 14 - 54 U/Tiffany   Alkaline Phosphatase 84 38 - 126 U/Tiffany   Total Bilirubin 0.3 0.3 - 1.2 mg/dL   GFR calc non Af Amer >60 >60 mL/min   GFR calc Af Amer >60 >60 mL/min    Comment: (NOTE) The eGFR has been calculated using the CKD EPI equation. This calculation has not been validated in all clinical situations. eGFR's persistently <60 mL/min signify possible Chronic Kidney Disease.    Anion gap 9 5 - 15  CBC with Differential     Status: Abnormal   Collection Time:  09/03/16  5:51 PM  Result Value Ref Range   WBC 9.4 4.0 - 10.5 K/uL   RBC 3.46 (Tiffany) 3.87 - 5.11 MIL/uL   Hemoglobin 10.1 (Tiffany) 12.0 - 15.0 g/dL   HCT 29.7 (Tiffany) 36.0 - 46.0 %   MCV 85.8 78.0 - 100.0 fL   MCH 29.2 26.0 - 34.0 pg   MCHC 34.0 30.0 - 36.0 g/dL   RDW 12.5 11.5 - 15.5 %   Platelets 578 (H) 150 - 400 K/uL   Neutrophils Relative % 70 %   Neutro Abs 6.5 1.7 - 7.7 K/uL   Lymphocytes Relative 21 %   Lymphs Abs 1.9 0.7 - 4.0 K/uL   Monocytes Relative 8 %   Monocytes Absolute 0.7 0.1 - 1.0 K/uL   Eosinophils Relative 2 %   Eosinophils Absolute 0.1 0.0 - 0.7 K/uL   Basophils Relative 1 %   Basophils Absolute 0.1 0.0 - 0.1 K/uL  I-Stat beta hCG blood, ED     Status: None   Collection Time: 09/03/16  6:01 PM  Result Value Ref Range   I-stat hCG, quantitative <5.0 <5 mIU/mL   Comment 3             Comment:   GEST. AGE      CONC.  (mIU/mL)   <=1 WEEK        5 - 50     2 WEEKS       50 - 500     3 WEEKS       100 - 10,000     4 WEEKS     1,000 - 30,000        FEMALE AND NON-PREGNANT FEMALE:     LESS THAN 5 mIU/mL   I-Stat CG4 Lactic Acid, ED     Status: None   Collection Time: 09/03/16  6:02 PM  Result Value Ref Range   Lactic Acid, Venous 1.17 0.5 - 1.9 mmol/Tiffany  POC CBG, ED     Status: Abnormal   Collection Time: 09/03/16  8:48 PM  Result Value Ref Range   Glucose-Capillary 188 (H) 65 - 99 mg/dL  Blood culture (routine x 2)     Status: None (Preliminary result)   Collection Time: 09/03/16  8:49 PM  Result Value Ref Range   Specimen Description RIGHT ANTECUBITAL    Special Requests BOTTLES DRAWN AEROBIC AND ANAEROBIC 5CC    Culture      NO GROWTH < 12 HOURS Performed at [facility]    Report Status PENDING   Blood culture (routine x 2)     Status: None (Preliminary result)   Collection Time: 09/03/16  8:49 PM  Result Value Ref Range   Specimen Description LEFT ANTECUBITAL    Special Requests BOTTLES DRAWN AEROBIC AND ANAEROBIC 5CC    Culture      NO GROWTH < 12 HOURS Performed at [facility]    Report Status PENDING   I-Stat CG4 Lactic Acid, ED     Status: None   Collection Time: 09/03/16  9:11 PM  Result Value Ref Range   Lactic Acid, Venous 0.89 0.5 - 1.9 mmol/Tiffany  Urinalysis, Routine w reflex microscopic     Status: Abnormal   Collection Time: 09/03/16  9:13 PM  Result Value Ref Range   Color, Urine YELLOW YELLOW   APPearance HAZY (A) CLEAR   Specific Gravity, Urine 1.018 1.005 - 1.030   pH 5.0 5.0 - 8.0   Glucose, UA NEGATIVE  NEGATIVE mg/dL   Hgb urine dipstick SMALL (A) NEGATIVE   Bilirubin Urine NEGATIVE NEGATIVE   Ketones, ur NEGATIVE NEGATIVE mg/dL   Protein, ur 100 (A) NEGATIVE mg/dL   Nitrite NEGATIVE NEGATIVE   Leukocytes, UA MODERATE (A) NEGATIVE   RBC / HPF 0-5 0 - 5 RBC/hpf   WBC, UA TOO NUMEROUS TO COUNT 0 - 5 WBC/hpf   Bacteria,  UA NONE SEEN NONE SEEN   Squamous Epithelial / LPF 0-5 (A) NONE SEEN   Mucous PRESENT    Hyaline Casts, UA PRESENT   Glucose, capillary     Status: Abnormal   Collection Time: 09/03/16 11:35 PM  Result Value Ref Range   Glucose-Capillary 137 (H) 65 - 99 mg/dL  Hemoglobin A1c     Status: Abnormal   Collection Time: 09/04/16  4:07 AM  Result Value Ref Range   Hgb A1c MFr Bld 10.4 (H) 4.8 - 5.6 %    Comment: (NOTE)         Pre-diabetes: 5.7 - 6.4         Diabetes: >6.4         Glycemic control for adults with diabetes: <7.0    Mean Plasma Glucose 252 mg/dL    Comment: (NOTE) Performed At: [facility] [place], [place] [id number] Lindon Romp MD [id number]   HIV antibody     Status: None   Collection Time: 09/04/16  4:07 AM  Result Value Ref Range   HIV Screen 4th Generation wRfx Non Reactive Non Reactive    Comment: (NOTE) Performed At: [facility] [place], [place] [id number] Lindon Romp MD [id number]   Sedimentation rate     Status: Abnormal   Collection Time: 09/04/16  4:07 AM  Result Value Ref Range   Sed Rate 107 (H) 0 - 22 mm/hr  C-reactive protein     Status: Abnormal   Collection Time: 09/04/16  4:07 AM  Result Value Ref Range   CRP 7.2 (H) <1.0 mg/dL    Comment: Performed at [facility]  Prealbumin     Status: Abnormal   Collection Time: 09/04/16  4:07 AM  Result Value Ref Range   Prealbumin 12.7 (Tiffany) 18 - 38 mg/dL    Comment: Performed at [place] metabolic panel     Status: Abnormal   Collection Time: 09/04/16  4:07 AM  Result Value Ref Range   Sodium 140 135 - 145 mmol/Tiffany   Potassium 3.7 3.5 - 5.1 mmol/Tiffany   Chloride 101 101 - 111 mmol/Tiffany   CO2 30 22 - 32 mmol/Tiffany   Glucose, Bld 166 (H) 65 - 99 mg/dL   BUN 12 6 - 20 mg/dL   Creatinine, Ser 0.58 0.44 - 1.00 mg/dL   Calcium 9.1 8.9 - 10.3 mg/dL   GFR calc non Af Amer >60 >60 mL/min   GFR calc Af Amer >60 >60 mL/min     Comment: (NOTE) The eGFR has been calculated using the CKD EPI equation. This calculation has not been validated in all clinical situations. eGFR's persistently <60 mL/min signify possible Chronic Kidney Disease.    Anion gap 9 5 - 15  CBC     Status: Abnormal   Collection Time: 09/04/16  4:07 AM  Result Value Ref Range   WBC 9.5 4.0 - 10.5 K/uL   RBC 3.51 (Tiffany) 3.87 - 5.11 MIL/uL   Hemoglobin 10.3 (Tiffany) 12.0 - 15.0 g/dL   HCT 30.3 (  Tiffany) 36.0 - 46.0 %   MCV 86.3 78.0 - 100.0 fL   MCH 29.3 26.0 - 34.0 pg   MCHC 34.0 30.0 - 36.0 g/dL   RDW 12.5 11.5 - 15.5 %   Platelets 587 (H) 150 - 400 K/uL  Glucose, capillary     Status: Abnormal   Collection Time: 09/04/16  7:49 AM  Result Value Ref Range   Glucose-Capillary 165 (H) 65 - 99 mg/dL  Glucose, capillary     Status: Abnormal   Collection Time: 09/04/16 11:42 AM  Result Value Ref Range   Glucose-Capillary 142 (H) 65 - 99 mg/dL  Glucose, capillary     Status: Abnormal   Collection Time: 09/04/16  5:22 PM  Result Value Ref Range   Glucose-Capillary 50 (Tiffany) 65 - 99 mg/dL  Glucose, capillary     Status: None   Collection Time: 09/04/16  5:55 PM  Result Value Ref Range   Glucose-Capillary 91 65 - 99 mg/dL  Glucose, capillary     Status: Abnormal   Collection Time: 09/04/16  6:17 PM  Result Value Ref Range   Glucose-Capillary 128 (H) 65 - 99 mg/dL  Glucose, capillary     Status: Abnormal   Collection Time: 09/04/16  8:56 PM  Result Value Ref Range   Glucose-Capillary 174 (H) 65 - 99 mg/dL  CBC     Status: Abnormal   Collection Time: 09/05/16  5:17 AM  Result Value Ref Range   WBC 8.5 4.0 - 10.5 K/uL   RBC 3.59 (Tiffany) 3.87 - 5.11 MIL/uL   Hemoglobin 10.3 (Tiffany) 12.0 - 15.0 g/dL   HCT 31.1 (Tiffany) 36.0 - 46.0 %   MCV 86.6 78.0 - 100.0 fL   MCH 28.7 26.0 - 34.0 pg   MCHC 33.1 30.0 - 36.0 g/dL   RDW 12.2 11.5 - 15.5 %   Platelets 609 (H) 150 - 400 K/uL  Glucose, capillary     Status: None   Collection Time: 09/05/16  6:56 AM  Result Value  Ref Range   Glucose-Capillary 85 65 - 99 mg/dL    Mr Foot Left W Wo Contrast  Result Date: 09/04/2016 CLINICAL DATA:  Large open wound on the plantar aspect of the great Davis. EXAM: MRI OF THE LEFT FOREFOOT WITHOUT AND WITH CONTRAST TECHNIQUE: Multiplanar, multisequence MR imaging of the left foot was performed both before and after administration of intravenous contrast. CONTRAST:  26m MULTIHANCE GADOBENATE DIMEGLUMINE 529 MG/ML IV SOLN COMPARISON:  Radiographs 09/03/2016 FINDINGS: There is a large open wound involving the plantar aspect of the great Davis. There is associated moderate diffuse subcutaneous soft tissue swelling/edema and enhancement suggesting cellulitis. The actual wound at itself does not demonstrate any enhancement suggesting necrotic tissue. Underlying edema like marrow signal abnormality and enhancement of distal phalanx is consistent with osteomyelitis. No definite findings for septic arthritis, septic tenosynovitis or pyomyositis. Moderate diffuse myofasciitis. Mild diffuse signal abnormality in the second metatarsal shaft. This may be some type of stress reaction. I do not see any significant enhancement. I doubt this is osteomyelitis. It is more likely a stress reaction. IMPRESSION: 1. Large open wound involving the plantar aspect of the great Davis. Nonenhancement of the tissue in the wound suggests tissue necrosis. 2. Osteomyelitis involving the distal phalanx of the great Davis. 3. Nonspecific signal abnormality in the second metatarsal shaft. I think this is unlikely osteomyelitis and more likely some type of stress reaction but post observation is suggested. 4. No discrete drainable soft tissue  abscess or pyomyositis. Electronically Signed   By: Marijo Sanes M.D.   On: 09/04/2016 18:33   Dg Davis Great Left  Result Date: 09/03/2016 CLINICAL DATA:  Subacute onset of left plantar great Davis wound, with erythema and swelling. Initial encounter. EXAM: LEFT GREAT Davis COMPARISON:  Left  foot radiographs performed 08/29/2016 FINDINGS: There is no evidence of fracture or dislocation. The joint spaces are preserved. There is a bipartite medial sesamoid of the great Davis. Soft tissue swelling is noted about the great Davis. The known soft tissue wound is not well characterized on radiograph. No radiopaque foreign bodies are seen. IMPRESSION: 1. No evidence of fracture or dislocation. 2. Bipartite medial sesamoid of the great Davis. 3. Soft tissue swelling about the great Davis. Electronically Signed   By: Garald Balding M.D.   On: 09/03/2016 20:19    Review of Systems  Constitutional: Negative for fever.  HENT: Negative for ear discharge and nosebleeds.   Eyes: Negative for blurred vision, discharge and redness.  Respiratory: Negative for cough, sputum production, shortness of breath and stridor.   Gastrointestinal: Negative for constipation, diarrhea, nausea and vomiting.  Musculoskeletal: Negative for myalgias.  Skin: Negative for rash.  Neurological: Negative for tremors, speech change and loss of consciousness.   Blood pressure (!) 148/89, pulse 90, temperature 98.7 F (37.1 C), temperature source Oral, resp. rate 16, height 5' 2"  (1.575 m), weight 65.8 kg (145 lb), last menstrual period 02/12/2011, SpO2 100 %. Physical Exam  Constitutional: She is oriented to person, place, and time. She appears well-developed and well-nourished. No distress.  HENT:  Head: Normocephalic and atraumatic.  Right Ear: External ear normal.  Left Ear: External ear normal.  Nose: Nose normal.  Mouth/Throat: No oropharyngeal exudate.  Eyes: EOM are normal. Right eye exhibits no discharge. Left eye exhibits no discharge.  Respiratory: Effort normal and breath sounds normal. No respiratory distress. She has no wheezes.  Musculoskeletal:       Left foot: There is decreased range of motion, tenderness, swelling and deformity.       Feet:  Neurological: She is alert and oriented to person, place, and  time. She has normal reflexes.  Skin: Skin is warm and dry. No rash noted. She is not diaphoretic. No pallor.  Psychiatric: She has a normal mood and affect. Her behavior is normal. Judgment and thought content normal.    MCKENZIE, KAYLA J 09/05/2016, 8:00 AM

## 2016-09-05 NOTE — Progress Notes (Signed)
Spoke with pt and husband at bedside concerning [name]. Both selected [name].  Will need HH orders please.

## 2016-09-05 NOTE — Progress Notes (Signed)
PROGRESS NOTE  [name] PFX:[id number] DOB: [birth year] DOA: 09/03/2016 PCP: Penni Homans, MD Brief History:  50 year old female with a history of diabetes mellitus, hyperlipidemia, hypertension, peripheral vascular disease, and intracerebral hemorrhage presented with nearly 2 week history of left diabetic foot infection. The patient initially noted a cut on her left great toe while working in the yard on 08/26/2016. On the same day, she went to see her primary care provider, Dr. Charlett Blake, on the same day, and was noted to have a temperature 101.35F. She was sent to the emergency department on the same day where a bedside debridement was performed. She was discharged from the emergency department with Bactrim and cephalexin. She returned to the emergency department on 08/28/2016 secondary to a reported positive blood culture. The patient was noted to be clinically stable, and her blood culture was thought to be a contaminant. The patient was discharged home with clindamycin in addition to her Bactrim and cephalexin.. She'll follow-up with her primary care provider on 09/02/2016 and was referred to [facility] after receiving intramuscular ceftriaxone. The patient saw Dr. [name] whom requested admission to the hospital.  Assessment/Plan: Diabetic foot infection/acute osteomyelitis great toe -Continue vancomycin, ceftriaxone -ESR 107 -check CRP--7.2 -MRI left foot--osteomyelitis distal phalanx of great toe -appreciate ortho consult -if patient does NOT want amputation-->place PICC line and d/c home with 6 weeks of IV ceftriaxone and vancomycin -d/c metronidazole -Would continue intravenous antibiotics until amputation date if patient elects for amputation  Diabetes mellitus type 2, uncontrolled with neuropathy -Decrease dose Lantus due to mild hypoglycemia -NovoLog sliding scale -09/03/16--Hemoglobin A1c--10.4 -hold metformin  Hypertension -Continue  lisinopril--increase to 20 mg daily  Hyperlipidemia -Continue statin  Pyuria -urine culture no growth -pt is asymptomatic   Disposition Plan:   pending ortho plan and pt decision on amputation Family Communication:   Spouse updated at bedside--Total time spent 35 minutes.  Greater than 50% spent face to face counseling and coordinating care.  Consultants:  Aron Baba  Code Status:  FULL  DVT Prophylaxis:  [place] Lovenox   Procedures: As Listed in Progress Note Above  Antibiotics: vanco 12/27>> Ceftriaxone 12/27>> Metro 12/27>>>12/29     Subjective: Patient denies fevers, chills, headache, chest pain, dyspnea, nausea, vomiting, diarrhea, abdominal pain, dysuria, hematuria, hematochezia, and melena.   Objective: Vitals:   09/04/16 1455 09/04/16 2100 09/05/16 0512 09/05/16 1414  BP: 138/78 126/62 (!) 148/89 (!) 151/77  Pulse: 80 79 90 92  Resp: 18 16 16 16   Temp: 98.6 F (37 C) 98 F (36.7 C) 98.7 F (37.1 C) 99 F (37.2 C)  TempSrc: Oral Axillary Oral Oral  SpO2: 99% 100% 100% 100%  Weight:      Height:        Intake/Output Summary (Last 24 hours) at 09/05/16 1732 Last data filed at 09/05/16 1414  Gross per 24 hour  Intake          2671.67 ml  Output                0 ml  Net          2671.67 ml   Weight change:  Exam:   General:  Pt is alert, follows commands appropriately, not in acute distress  HEENT: No icterus, No thrush, No neck mass, [place]/AT  Cardiovascular: RRR, S1/S2, no rubs, no gallops  Respiratory: CTA bilaterally, no wheezing, no crackles, no rhonchi  Abdomen: Soft/+BS, non tender, non distended, no guarding  Extremities: Left great toe with necrotic tissue on the plantar surface with edema extending to the metatarsophalangeal joint. No crepitance. No purulent drainage.   Data Reviewed: I have personally reviewed following labs and imaging studies Basic Metabolic Panel:  Recent Labs Lab 09/03/16 1751  09/04/16 0407  NA 137 140  K 3.7 3.7  CL 101 101  CO2 27 30  GLUCOSE 272* 166*  BUN 14 12  CREATININE 0.61 0.58  CALCIUM 9.3 9.1   Liver Function Tests:  Recent Labs Lab 09/03/16 1751  AST 15  ALT 17  ALKPHOS 84  BILITOT 0.3  PROT 7.4  ALBUMIN 3.0*   No results for input(s): LIPASE, AMYLASE in the last 168 hours. No results for input(s): AMMONIA in the last 168 hours. Coagulation Profile: No results for input(s): INR, PROTIME in the last 168 hours. CBC:  Recent Labs Lab 09/03/16 1751 09/04/16 0407 09/05/16 0517  WBC 9.4 9.5 8.5  NEUTROABS 6.5  --   --   HGB 10.1* 10.3* 10.3*  HCT 29.7* 30.3* 31.1*  MCV 85.8 86.3 86.6  PLT 578* 587* 609*   Cardiac Enzymes: No results for input(s): CKTOTAL, CKMB, CKMBINDEX, TROPONINI in the last 168 hours. BNP: Invalid input(s): POCBNP CBG:  Recent Labs Lab 09/04/16 2056 09/05/16 0656 09/05/16 1119 09/05/16 1418 09/05/16 1715  GLUCAP 174* 85 166* 85 103*   HbA1C:  Recent Labs  09/04/16 0407  HGBA1C 10.4*   Urine analysis:    Component Value Date/Time   COLORURINE YELLOW 09/03/2016 2113   APPEARANCEUR HAZY (A) 09/03/2016 2113   LABSPEC 1.018 09/03/2016 2113   PHURINE 5.0 09/03/2016 2113   GLUCOSEU NEGATIVE 09/03/2016 2113   GLUCOSEU NEGATIVE 05/21/2015 0831   HGBUR SMALL (A) 09/03/2016 2113   BILIRUBINUR NEGATIVE 09/03/2016 2113   [place] NEGATIVE 09/03/2016 2113   PROTEINUR 100 (A) 09/03/2016 2113   UROBILINOGEN 0.2 05/21/2015 0831   NITRITE NEGATIVE 09/03/2016 2113   LEUKOCYTESUR MODERATE (A) 09/03/2016 2113   Sepsis Labs: @LABRCNTIP (procalcitonin:4,lacticidven:4) ) Recent Results (from the past 240 hour(s))  Blood culture (routine x 2)     Status: None   Collection Time: 08/29/16 10:50 AM  Result Value Ref Range Status   Specimen Description BLOOD RIGHT ANTECUBITAL  Final   Special Requests IN PEDIATRIC BOTTLE [place]  Final   Culture   Final    NO GROWTH 5 DAYS Performed at [facility]    Report Status 09/03/2016 FINAL  Final  Blood culture (routine x 2)     Status: None   Collection Time: 08/29/16 10:55 AM  Result Value Ref Range Status   Specimen Description BLOOD RIGHT HAND  Final   Special Requests BOTTLES DRAWN AEROBIC AND ANAEROBIC 4CC  Final   Culture   Final    NO GROWTH 5 DAYS Performed at [facility]    Report Status 09/03/2016 FINAL  Final  Blood culture (routine x 2)     Status: None (Preliminary result)   Collection Time: 09/03/16  8:49 PM  Result Value Ref Range Status   Specimen Description RIGHT ANTECUBITAL  Final   Special Requests BOTTLES DRAWN AEROBIC AND ANAEROBIC 5CC  Final   Culture   Final    NO GROWTH 2 DAYS Performed at [facility]    Report Status PENDING  Incomplete  Blood culture (routine x 2)     Status: None (Preliminary result)   Collection Time: 09/03/16  8:49 PM  Result Value Ref Range Status   Specimen  Description LEFT ANTECUBITAL  Final   Special Requests BOTTLES DRAWN AEROBIC AND ANAEROBIC 5CC  Final   Culture   Final    NO GROWTH 2 DAYS Performed at [facility]    Report Status PENDING  Incomplete  Urine culture     Status: None   Collection Time: 09/03/16  9:12 PM  Result Value Ref Range Status   Specimen Description URINE, CLEAN CATCH  Final   Special Requests NONE  Final   Culture NO GROWTH Performed at [facility]   Final   Report Status 09/05/2016 FINAL  Final  Surgical PCR screen     Status: None   Collection Time: 09/05/16  9:36 AM  Result Value Ref Range Status   MRSA, PCR NEGATIVE NEGATIVE Final   Staphylococcus aureus NEGATIVE NEGATIVE Final    Comment:        The Xpert SA Assay (FDA approved for NASAL specimens in patients over 53 years of age), is one component of a comprehensive surveillance program.  Test performance has been validated by [facility] for patients greater than or equal to 36 year old. It is not intended to diagnose infection nor  to guide or monitor treatment.      Scheduled Meds: . aspirin EC  81 mg Oral Daily  . cefTRIAXone (ROCEPHIN)  IV  2 g Intravenous QHS   And  . metroNIDAZOLE  500 mg Oral Q8H  . docusate sodium  100 mg Oral BID  . enoxaparin (LOVENOX) injection  40 mg Subcutaneous QHS  . insulin aspart  0-15 Units Subcutaneous TID WC  . insulin aspart  0-5 Units Subcutaneous QHS  . insulin aspart  4 Units Subcutaneous TID WC  . insulin glargine  50 Units Subcutaneous BID  . lisinopril  10 mg Oral Daily  . magic mouthwash w/lidocaine  5 mL Oral QID  . multivitamin with minerals  1 tablet Oral Daily  . protein supplement shake  11 oz Oral Q24H  . simvastatin  5 mg Oral q1800  . vancomycin  750 mg Intravenous Q12H   Continuous Infusions: . lactated ringers 50 mL/hr at 09/03/16 2347    Procedures/Studies: Dg Chest 2 View  Result Date: 08/26/2016 CLINICAL DATA:  Congestion.  Fever. EXAM: CHEST  2 VIEW COMPARISON:  11/05/2002 report. FINDINGS: Mediastinum hilar structures normal. Mild cardiomegaly. Low lung volumes. No focal infiltrate. No pleural effusion or pneumothorax. IMPRESSION: 1.  Mild cardiomegaly.  2. Low lung volumes.  No focal infiltrate. Electronically Signed   By: Marcello Moores  Register   On: 08/26/2016 17:19   Mr Foot Left W Wo Contrast  Result Date: 09/04/2016 CLINICAL DATA:  Large open wound on the plantar aspect of the great toe. EXAM: MRI OF THE LEFT FOREFOOT WITHOUT AND WITH CONTRAST TECHNIQUE: Multiplanar, multisequence MR imaging of the left foot was performed both before and after administration of intravenous contrast. CONTRAST:  23m MULTIHANCE GADOBENATE DIMEGLUMINE 529 MG/ML IV SOLN COMPARISON:  Radiographs 09/03/2016 FINDINGS: There is a large open wound involving the plantar aspect of the great toe. There is associated moderate diffuse subcutaneous soft tissue swelling/edema and enhancement suggesting cellulitis. The actual wound at itself does not demonstrate any enhancement  suggesting necrotic tissue. Underlying edema like marrow signal abnormality and enhancement of distal phalanx is consistent with osteomyelitis. No definite findings for septic arthritis, septic tenosynovitis or pyomyositis. Moderate diffuse myofasciitis. Mild diffuse signal abnormality in the second metatarsal shaft. This may be some type of stress reaction. I do not  see any significant enhancement. I doubt this is osteomyelitis. It is more likely a stress reaction. IMPRESSION: 1. Large open wound involving the plantar aspect of the great toe. Nonenhancement of the tissue in the wound suggests tissue necrosis. 2. Osteomyelitis involving the distal phalanx of the great toe. 3. Nonspecific signal abnormality in the second metatarsal shaft. I think this is unlikely osteomyelitis and more likely some type of stress reaction but post observation is suggested. 4. No discrete drainable soft tissue abscess or pyomyositis. Electronically Signed   By: Marijo Sanes M.D.   On: 09/04/2016 18:33   US Venous Img Lower Unilateral Left  Result Date: 09/02/2016 CLINICAL DATA:  Left foot and calf pain. EXAM: LEFT LOWER EXTREMITY VENOUS DOPPLER ULTRASOUND TECHNIQUE: Gray-scale sonography with graded compression, as well as color Doppler and duplex ultrasound, were performed to evaluate the deep venous system from the level of the common femoral vein through the popliteal and proximal calf veins. Spectral Doppler was utilized to evaluate flow at rest and with distal augmentation maneuvers. COMPARISON:  None. FINDINGS: Right common femoral vein is patent without thrombus. Normal compressibility, augmentation and color Doppler flow in the left common femoral vein, left femoral vein and left popliteal vein. The left saphenofemoral junction is patent. Left profunda femoral vein is patent without thrombus. Visualized left deep calf veins are patent without thrombus. Left great saphenous vein is compressible without thrombus.  IMPRESSION: Negative for deep venous thrombosis in left lower extremity. Electronically Signed   By: Markus Daft M.D.   On: 09/02/2016 14:43   Dg Foot Complete Left  Result Date: 08/29/2016 CLINICAL DATA:  Cut on the bottom of the big toe with swelling and redness. EXAM: LEFT FOOT - COMPLETE 3+ VIEW COMPARISON:  None. FINDINGS: There is soft tissue swelling involving the great toe. There is no evidence for a fracture. No evidence for cortical destruction or periosteal reaction. IMPRESSION: No acute bone abnormality involving the left foot. Soft tissue swelling involving the great toe. Electronically Signed   By: Markus Daft M.D.   On: 08/29/2016 12:07   Dg Toe Great Left  Result Date: 09/03/2016 CLINICAL DATA:  Subacute onset of left plantar great toe wound, with erythema and swelling. Initial encounter. EXAM: LEFT GREAT TOE COMPARISON:  Left foot radiographs performed 08/29/2016 FINDINGS: There is no evidence of fracture or dislocation. The joint spaces are preserved. There is a bipartite medial sesamoid of the great toe. Soft tissue swelling is noted about the great toe. The known soft tissue wound is not well characterized on radiograph. No radiopaque foreign bodies are seen. IMPRESSION: 1. No evidence of fracture or dislocation. 2. Bipartite medial sesamoid of the great toe. 3. Soft tissue swelling about the great toe. Electronically Signed   By: Garald Balding M.D.   On: 09/03/2016 20:19    TAT, DAVID, DO  [facility] Pager [phone]  If 7PM-7AM, please contact night-coverage [email or web address] Password [email or web address] 09/05/2016, 5:32 PM   LOS: 1 day

## 2016-09-05 NOTE — Progress Notes (Signed)
Changed Left great toe dressing per order.  Will f/u with dayshift nurse.

## 2016-09-05 NOTE — Progress Notes (Signed)
Inpatient Diabetes Program Recommendations  AACE/ADA: New Consensus Statement on Inpatient Glycemic Control (2015)  Target Ranges:  Prepandial:   less than 140 mg/dL      Peak postprandial:   less than 180 mg/dL (1-2 hours)      Critically ill patients:  140 - 180 mg/dL   Results for [name], [name] (MRN [id number]) as of 09/05/2016 10:36  Ref. Range 09/04/2016 07:49 09/04/2016 11:42 09/04/2016 17:22 09/04/2016 17:55 09/04/2016 18:17 09/04/2016 20:56  Glucose-Capillary Latest Ref Range: 65 - 99 mg/dL 165 (H) 142 (H) 50 (L) 91 128 (H) 174 (H)   Results for [name], [name] (MRN [id number]) as of 09/05/2016 10:36  Ref. Range 11/20/2015 11:14 03/30/2016 10:55 09/04/2016 04:07  Hemoglobin A1C Latest Ref Range: 4.8 - 5.6 % 12.8 (H) 12.6 (H) 10.4 (H)    Admit: Diabetic foot infection/cellulitis left foot  History: DM  Home DM Meds: Lantus 50 units BID                              Metformin 1000 mg AM/ 500 mg Lunch/ 1000 mg PM  Current Insulin Orders: Lantus 50 units BID                                       Novolog Moderate Correction Scale/ SSI (0-15 units) TID AC + HS                                       Novolog 4 units TID with meals       MD- Patient with Hypoglycemia yesterday at 5pm after receiving 6 units Novolog at 12pm (2 units SSI + 4 units meal coverage).  Please consider the following in-hospital insulin adjustments:  1. Reduce Novolog Correction Scale/ SSI to Sensitive scale (0-9 units) TID AC + HS  2. Reduce Novolog Meal Coverage to: Novolog 3 units TID with meals (hold if pt eats <50% of meal)     -Note patient saw her PCP: Dr. Willette Alma with [facility] on 08/26/16.  No changes made to pt's DM medication regimen.  -Spoke with patient about her current A1c of 10.4%.  Explained what an A1c is and what it measures.  Reminded patient that her goal A1c is 7% or less per ADA standards to prevent both acute and long-term complications.  Explained to  patient the extreme importance of good glucose control at home.  Encouraged patient to check her CBGs at least bid at home (fasting and another check within the day) and to record all CBGs in a logbook for her PCP to review.  Encouraged pt to check CBGs both before meals and to check some CBGs 2 hours after meals as well to get a more accurate picture of her blood glucose control at home.  Reviewed CBG goals with pt as well (80-130 mg/dl before meals and <180 mg/dl two hours after meals).  -Patient told me she had an appt with Dr. Charlett Blake today.  Told pt I will call PCP office to let them know she has been hospitalized.  Pt to re-schedule PCP appt after d/c.      --Will follow patient during hospitalization--  Wyn Quaker RN, MSN, CDE Diabetes Coordinator Inpatient Glycemic Control Team Team Pager: [phone] (8a-5p)

## 2016-09-06 DIAGNOSIS — E1169 Type 2 diabetes mellitus with other specified complication: Principal | ICD-10-CM

## 2016-09-06 DIAGNOSIS — M86172 Other acute osteomyelitis, left ankle and foot: Secondary | ICD-10-CM

## 2016-09-06 DIAGNOSIS — L089 Local infection of the skin and subcutaneous tissue, unspecified: Secondary | ICD-10-CM

## 2016-09-06 DIAGNOSIS — E0842 Diabetes mellitus due to underlying condition with diabetic polyneuropathy: Secondary | ICD-10-CM

## 2016-09-06 LAB — GLUCOSE, CAPILLARY
Glucose-Capillary: 110 mg/dL — ABNORMAL HIGH (ref 65–99)
Glucose-Capillary: 119 mg/dL — ABNORMAL HIGH (ref 65–99)

## 2016-09-06 LAB — BASIC METABOLIC PANEL
Anion gap: 7 (ref 5–15)
BUN: 19 mg/dL (ref 6–20)
CO2: 27 mmol/L (ref 22–32)
Calcium: 8.8 mg/dL — ABNORMAL LOW (ref 8.9–10.3)
Chloride: 105 mmol/L (ref 101–111)
Creatinine, Ser: 0.58 mg/dL (ref 0.44–1.00)
GFR calc Af Amer: >60 mL/min (ref >60)
GFR calc non Af Amer: >60 mL/min (ref >60)
Glucose, Bld: 83 mg/dL (ref 65–99)
Potassium: 4.4 mmol/L (ref 3.5–5.1)
Sodium: 139 mmol/L (ref 135–145)

## 2016-09-06 MED ORDER — CLINDAMYCIN HCL 300 MG PO CAPS: 300.0000 mg | ORAL_CAPSULE | Freq: Four times a day (QID) | ORAL | 0 refills | Status: DC

## 2016-09-06 MED ORDER — INSULIN GLARGINE 100 UNIT/ML ~~LOC~~ SOLN: 45.0000 [IU] | Freq: Two times a day (BID) | SUBCUTANEOUS | 11 refills | Status: DC

## 2016-09-06 MED ORDER — HYDROCODONE-ACETAMINOPHEN 5-325 MG PO TABS: 1.0000 | ORAL_TABLET | Freq: Four times a day (QID) | ORAL | 0 refills | Status: DC | PRN

## 2016-09-06 MED ORDER — LISINOPRIL 20 MG PO TABS: 20.0000 mg | ORAL_TABLET | Freq: Every day | ORAL | 0 refills | Status: DC

## 2016-09-06 MED ORDER — DEXTROSE 5 % IV SOLN
2.0000 g | INTRAVENOUS | Status: DC
  Administered 2016-09-06: 2 g via INTRAVENOUS
  Filled 2016-09-06: qty 2

## 2016-09-06 MED ORDER — CIPROFLOXACIN HCL 500 MG PO TABS: 500.0000 mg | ORAL_TABLET | Freq: Two times a day (BID) | ORAL | 0 refills | Status: DC

## 2016-09-06 NOTE — Progress Notes (Signed)
Assessment unchanged. Dois Balding, RN gave dc instructions earlier. Pt had no further questions at time of dc. Scheduled for outpatient surgery on lt great toe on Tuesday September 09, 2016. Dressing changed to lt great toe prior to dc per pt request. Discharged via wc to front entrance to meet awaiting vehicle to carry home. Accompanied by NT.

## 2016-09-06 NOTE — Discharge Summary (Signed)
Physician Discharge Summary  [name] [id number] DOB: 23-Apr-1966 DOA: 09/03/2016  PCP: Tiffany Homans, MD  Admit date: 09/03/2016 Discharge date: 09/06/2016   Recommendations for Outpatient Follow-Up:   1. Surgery on Tuesday-- abx given for 4 days (if needs more, ortho can dose) 2. Needs tighter diabetic control   Discharge Diagnosis:   Active Problems:   Diabetic polyneuropathy associated with diabetes mellitus due to underlying condition ([place])   Diabetic foot infection ([place])   Cellulitis   Oral thrush   Moderate malnutrition (H[facility])   Anemia of chronic disease   Acute osteomyelitis of toe, left [facility])   Discharge disposition:  Home.   Discharge Condition: Improved.  Diet recommendation: Low sodium, heart healthy.  Carbohydrate-modified  Wound care: None.   History of Present Illness:   [name] is a 50 y.o. female with medical history significant of PVD, diabetic neuropathy, HTN, HLD presenting with a toe infection.  Last Tuesday, she was working in the yard when she noticed that there was a cut on the bottom of her big toe.  Had a knot on the side of it.  Went ot see Dr. Charlett Davis 12/19, temp up to 101.  Went to ER at [facility].  Debrided, wrapped foot, [name] to look for foreign body, given Keflex and was instructed to return if it worsened.  Returned 12/21, positive blood cultures, "changed to a stronger antibiotic" - Bactrim.  Noticed streaking from foot up her leg, took and xray to evaluate for osteomyelitis and changed to Clindamycin.  Told her to f/u with PCP, saw Dr. Cheri Davis on 12/26, given Rocephin 2 grams and referred to ortho.  She referred to [facility], where she went today and saw Dr. Rip Davis and he sent her to the ER.  Fever to 101 last week, now 99.1.  Slight chills, no sweats.  No pain in her foot unless she tries to walk on it or moves it a certain way.  The left leg is sore.      Hospital Course by Problem:   Diabetic foot  infection/acute osteomyelitis great toe -vancomycin, ceftriaxone x 4 day -MRI left foot--osteomyelitis distal phalanx of great toe -appreciate ortho consult- plan for outpatient surgery on Tuesday -IV abx given on Saturday, PO cipro/clinda for 4 days given (if needs more, will defer to ortho)  Diabetes mellitus type 2, uncontrolled with neuropathy -resume home meds -blood sugars should improve once infection is taken care of  Hypertension -Continue lisinopril--increase to 20 mg daily  Hyperlipidemia -Continue statin  Pyuria -urine culture no growth -pt is asymptomatic    Medical Consultants:    ortho   Discharge Exam:   Vitals:   09/05/16 2110 09/06/16 0440  BP: (!) 148/81 (!) 146/75  Pulse: 87 87  Resp: 18 18  Temp: 98.7 F (37.1 C) 98.5 F (36.9 C)   Vitals:   09/05/16 0512 09/05/16 1414 09/05/16 2110 09/06/16 0440  BP: (!) 148/89 (!) 151/77 (!) 148/81 (!) 146/75  Pulse: 90 92 87 87  Resp: 16 16 18 18   Temp: 98.7 F (37.1 C) 99 F (37.2 C) 98.7 F (37.1 C) 98.5 F (36.9 C)  TempSrc: Oral Oral Oral Oral  SpO2: 100% 100% 98% 99%  Weight:      Height:        Gen:  NAD Redness on leg/foot improved per patient   The results of significant diagnostics from this hospitalization (including imaging, microbiology, ancillary and laboratory) are listed below for reference.  Procedures and Diagnostic Studies:   Mr Foot Left W Wo Contrast  Result Date: 09/04/2016 CLINICAL DATA:  Large open wound on the plantar aspect of the great toe. EXAM: MRI OF THE LEFT FOREFOOT WITHOUT AND WITH CONTRAST TECHNIQUE: Multiplanar, multisequence MR imaging of the left foot was performed both before and after administration of intravenous contrast. CONTRAST:  59mL MULTIHANCE GADOBENATE DIMEGLUMINE 529 MG/ML IV SOLN COMPARISON:  Radiographs 09/03/2016 FINDINGS: There is a large open wound involving the plantar aspect of the great toe. There is associated moderate diffuse  subcutaneous soft tissue swelling/edema and enhancement suggesting cellulitis. The actual wound at itself does not demonstrate any enhancement suggesting necrotic tissue. Underlying edema like marrow signal abnormality and enhancement of distal phalanx is consistent with osteomyelitis. No definite findings for septic arthritis, septic tenosynovitis or pyomyositis. Moderate diffuse myofasciitis. Mild diffuse signal abnormality in the second metatarsal shaft. This may be some type of stress reaction. I do not see any significant enhancement. I doubt this is osteomyelitis. It is more likely a stress reaction. IMPRESSION: 1. Large open wound involving the plantar aspect of the great toe. Nonenhancement of the tissue in the wound suggests tissue necrosis. 2. Osteomyelitis involving the distal phalanx of the great toe. 3. Nonspecific signal abnormality in the second metatarsal shaft. I think this is unlikely osteomyelitis and more likely some type of stress reaction but post observation is suggested. 4. No discrete drainable soft tissue abscess or pyomyositis. Electronically Signed   By: Marijo Sanes M.D.   On: 09/04/2016 18:33   Dg Toe Great Left  Result Date: 09/03/2016 CLINICAL DATA:  Subacute onset of left plantar great toe wound, with erythema and swelling. Initial encounter. EXAM: LEFT GREAT TOE COMPARISON:  Left foot radiographs performed 08/29/2016 FINDINGS: There is no evidence of fracture or dislocation. The joint spaces are preserved. There is a bipartite medial sesamoid of the great toe. Soft tissue swelling is noted about the great toe. The known soft tissue wound is not well characterized on radiograph. No radiopaque foreign bodies are seen. IMPRESSION: 1. No evidence of fracture or dislocation. 2. Bipartite medial sesamoid of the great toe. 3. Soft tissue swelling about the great toe. Electronically Signed   By: Garald Balding M.D.   On: 09/03/2016 20:19     Labs:   Basic Metabolic  Panel:  Recent Labs Lab 09/03/16 1751 09/04/16 0407 09/06/16 0439  NA 137 140 139  K 3.7 3.7 4.4  CL 101 101 105  CO2 27 30 27   GLUCOSE 272* 166* 83  BUN 14 12 19   CREATININE 0.61 0.58 0.58  CALCIUM 9.3 9.1 8.8*   GFR Estimated Creatinine Clearance: 73.2 mL/min (by C-G formula based on SCr of 0.58 mg/dL). Liver Function Tests:  Recent Labs Lab 09/03/16 1751  AST 15  ALT 17  ALKPHOS 84  BILITOT 0.3  PROT 7.4  ALBUMIN 3.0*   No results for input(s): LIPASE, AMYLASE in the last 168 hours. No results for input(s): AMMONIA in the last 168 hours. Coagulation profile No results for input(s): INR, PROTIME in the last 168 hours.  CBC:  Recent Labs Lab 09/03/16 1751 09/04/16 0407 09/05/16 0517  WBC 9.4 9.5 8.5  NEUTROABS 6.5  --   --   HGB 10.1* 10.3* 10.3*  HCT 29.7* 30.3* 31.1*  MCV 85.8 86.3 86.6  PLT 578* 587* 609*   Cardiac Enzymes: No results for input(s): CKTOTAL, CKMB, CKMBINDEX, TROPONINI in the last 168 hours. BNP: Invalid input(s): POCBNP CBG:  Recent  Labs Lab 09/05/16 1418 09/05/16 1715 09/05/16 2145 09/06/16 0719 09/06/16 1130  GLUCAP 85 103* 192* 110* 119*   D-Dimer No results for input(s): DDIMER in the last 72 hours. Hgb A1c  Recent Labs  09/04/16 0407  HGBA1C 10.4*   Lipid Profile No results for input(s): CHOL, HDL, LDLCALC, TRIG, CHOLHDL, LDLDIRECT in the last 72 hours. Thyroid function studies No results for input(s): TSH, T4TOTAL, T3FREE, THYROIDAB in the last 72 hours.  Invalid input(s): FREET3 Anemia work up No results for input(s): VITAMINB12, FOLATE, FERRITIN, TIBC, IRON, RETICCTPCT in the last 72 hours. Microbiology Recent Results (from the past 240 hour(s))  Blood culture (routine x 2)     Status: None   Collection Time: 08/29/16 10:50 AM  Result Value Ref Range Status   Specimen Description BLOOD RIGHT ANTECUBITAL  Final   Special Requests IN PEDIATRIC BOTTLE [place]  Final   Culture   Final    NO GROWTH 5  DAYS Performed at [facility]    Report Status 09/03/2016 FINAL  Final  Blood culture (routine x 2)     Status: None   Collection Time: 08/29/16 10:55 AM  Result Value Ref Range Status   Specimen Description BLOOD RIGHT HAND  Final   Special Requests BOTTLES DRAWN AEROBIC AND ANAEROBIC 4CC  Final   Culture   Final    NO GROWTH 5 DAYS Performed at [facility]    Report Status 09/03/2016 FINAL  Final  Blood culture (routine x 2)     Status: None (Preliminary result)   Collection Time: 09/03/16  8:49 PM  Result Value Ref Range Status   Specimen Description RIGHT ANTECUBITAL  Final   Special Requests BOTTLES DRAWN AEROBIC AND ANAEROBIC 5CC  Final   Culture   Final    NO GROWTH 3 DAYS Performed at [facility]    Report Status PENDING  Incomplete  Blood culture (routine x 2)     Status: None (Preliminary result)   Collection Time: 09/03/16  8:49 PM  Result Value Ref Range Status   Specimen Description LEFT ANTECUBITAL  Final   Special Requests BOTTLES DRAWN AEROBIC AND ANAEROBIC 5CC  Final   Culture   Final    NO GROWTH 3 DAYS Performed at [facility]    Report Status PENDING  Incomplete  Urine culture     Status: None   Collection Time: 09/03/16  9:12 PM  Result Value Ref Range Status   Specimen Description URINE, CLEAN CATCH  Final   Special Requests NONE  Final   Culture NO GROWTH Performed at [facility]   Final   Report Status 09/05/2016 FINAL  Final  Surgical PCR screen     Status: None   Collection Time: 09/05/16  9:36 AM  Result Value Ref Range Status   MRSA, PCR NEGATIVE NEGATIVE Final   Staphylococcus aureus NEGATIVE NEGATIVE Final    Comment:        The Xpert SA Assay (FDA approved for NASAL specimens in patients over 48 years of age), is one component of a comprehensive surveillance program.  Test performance has been validated by [facility] for patients greater than or equal to 56 year old. It is not intended to  diagnose infection nor to guide or monitor treatment.      Discharge Instructions:   Discharge Instructions    Diet Carb Modified    Complete by:  As directed    Discharge instructions  Complete by:  As directed    Pick up an OTC lactobacillus as these abx will give you diarrhea Per ortho: NPO after midnight on Monday night, to take only half of her normal morning insulin dose Tuesday, and to arrive at the surgery center at 10:30 AM on Tuesday.   Increase activity slowly    Complete by:  As directed      Allergies as of 09/06/2016      Reactions   Lyrica [pregabalin] Other (See Comments)   MADE PATIENT VERY EMOTIONAL AND WOULD CRY EASILY   Tramadol Nausea And Vomiting   Pt cant tolerate this pain med.    Morphine And Related Nausea And Vomiting      Medication List    STOP taking these medications   Insulin Glargine 100 UNIT/ML Solostar Pen Commonly known as:  LANTUS SOLOSTAR Replaced by:  insulin glargine 100 UNIT/ML injection     TAKE these medications   aspirin EC 81 MG tablet Take 81 mg by mouth daily.   ciprofloxacin 500 MG tablet Commonly known as:  CIPRO Take 1 tablet (500 mg total) by mouth 2 (two) times daily.   clindamycin 300 MG capsule Commonly known as:  CLEOCIN Take 1 capsule (300 mg total) by mouth 4 (four) times daily.   freestyle lancets DX- 250.02  Pts machine is Freestyle Freedom Lite   glucose blood test strip Commonly known as:  ONE TOUCH ULTRA TEST Use once daily to check blood sugar.  DX E11.9   HYDROcodone-acetaminophen 5-325 MG tablet Commonly known as:  NORCO Take 1 tablet by mouth every 6 (six) hours as needed for moderate pain.   insulin glargine 100 UNIT/ML injection Commonly known as:  LANTUS Inject 0.45 mLs (45 Units total) into the skin 2 (two) times daily. Replaces:  Insulin Glargine 100 UNIT/ML Solostar Pen   Insulin Pen Needle 32G X 4 MM Misc Use as directed for insulin.DX E11.65   lisinopril 20 MG  tablet Commonly known as:  PRINIVIL,ZESTRIL Take 1 tablet (20 mg total) by mouth daily. Start taking on:  09/07/2016 What changed:  See the new instructions.   metFORMIN 1000 MG tablet Commonly known as:  GLUCOPHAGE TAKE 1 TABLET TWICE DAILY AND 1/2 TABLET AT NOON   simvastatin 5 MG tablet Commonly known as:  ZOCOR Take 1 tablet (5 mg total) by mouth daily.      Follow-up Information    [facility]. Go to.   Why:  on Tuesday, January 2nd, arriving at 10:30 am. Contact information: [place] [id number]       Jolyn Nap., MD Follow up.   Specialty:  Orthopedic Surgery Why:  DO NOT COME TO MY OFFICE ON TUESDAY--GO TO THE SURGERY CENTER, ARRIVING AT 10:30 AM Contact information: [place] [id number]        Tiffany Homans, MD Follow up in 1 week(s).   Specialty:  Family Medicine Contact information: [place] [address] [phone]            Time coordinating discharge: 35 min  Signed:  JESSICA Alison Stalling   [facility] 09/06/2016, 1:16 PM

## 2016-09-06 NOTE — Progress Notes (Signed)
Dr. Marcello Moores in to evaluate and speak with patient concerning options L great toe infection.   CBG at 192, no HS coverage needed per ordered parameters.   Noted LAC iv site area with slight redness. IVF's discontinued and removed and new IV site started see flow sheet. L arm elevated onto pillow and warm compress applied.

## 2016-09-06 NOTE — Care Management Note (Signed)
Case Management Note  Patient Details  Name: [name] MRN: [id number] Date of Birth: October 29, 1965  Subjective/Objective:     right toe osteomyelitis, scheduled amputation of toe for 09/11/2015.               Action/Plan: Discharge Planning: AVS reviewed:  NCM spoke to pt and [facility] RN not needed. She has planned to have surgery. Notified [facility] to cancel referral.   Expected Discharge Date: 09/06/2016            Expected Discharge Plan:  Home/Self Care  In-House Referral:  NA  Discharge planning Services  CM Consult  Post Acute Care Choice:  NA Choice offered to:  NA  DME Arranged:  N/A DME Agency:  NA  HH Arranged:  NA HH Agency:  NA  Status of Service:  Completed, signed off  If discussed at [place] of Stay Meetings, dates discussed:    Additional Comments:  Erenest Rasher, RN 09/06/2016, 1:52 PM

## 2016-09-06 NOTE — Discharge Instructions (Signed)
1. Do not eat or drink anything after midnight on Monday night (except a sip of plain water with medications if needed) 2. Take your antibiotics as prescribed 3. Arrive at the surgery center at 10:30 on Tuesday       Follow with Penni Homans, MD in 5-7 days  Please get a complete blood count and chemistry panel checked by your Primary MD at your next visit, and again as instructed by your Primary MD. Please get your medications reviewed and adjusted by your Primary MD.  Please request your Primary MD to go over all Hospital Tests and Procedure/Radiological results at the follow up, please get all Hospital records sent to your Prim MD by signing hospital release before you go home.  If you had Pneumonia of Lung problems at the Hospital: Please get a 2 view Chest X ray done in 6-8 weeks after hospital discharge or sooner if instructed by your Primary MD.  If you have Congestive Heart Failure: Please call your Cardiologist or Primary MD anytime you have any of the following symptoms:  1) 3 pound weight gain in 24 hours or 5 pounds in 1 week  2) shortness of breath, with or without a dry hacking cough  3) swelling in the hands, feet or stomach  4) if you have to sleep on extra pillows at night in order to breathe  Follow cardiac low salt diet and 1.5 lit/day fluid restriction.  If you have diabetes Accuchecks 4 times/day, Once in AM empty stomach and then before each meal. Log in all results and show them to your primary doctor at your next visit. If any glucose reading is under 80 or above 300 call your primary MD immediately.  If you have Seizure/Convulsions/Epilepsy: Please do not drive, operate heavy machinery, participate in activities at heights or participate in high speed sports until you have seen by Primary MD or a Neurologist and advised to do so again.  If you had Gastrointestinal Bleeding: Please ask your Primary MD to check a complete blood count within one week of  discharge or at your next visit. Your endoscopic/colonoscopic biopsies that are pending at the time of discharge, will also need to followed by your Primary MD.  Get Medicines reviewed and adjusted. Please take all your medications with you for your next visit with your Primary MD  Please request your Primary MD to go over all hospital tests and procedure/radiological results at the follow up, please ask your Primary MD to get all Hospital records sent to his/her office.  If you experience worsening of your admission symptoms, develop shortness of breath, life threatening emergency, suicidal or homicidal thoughts you must seek medical attention immediately by calling 911 or calling your MD immediately  if symptoms less severe.  You must read complete instructions/literature along with all the possible adverse reactions/side effects for all the Medicines you take and that have been prescribed to you. Take any new Medicines after you have completely understood and accpet all the possible adverse reactions/side effects.   Do not drive or operate heavy machinery when taking Pain medications.   Do not take more than prescribed Pain, Sleep and Anxiety Medications  Special Instructions: If you have smoked or chewed Tobacco  in the last 2 yrs please stop smoking, stop any regular Alcohol  and or any Recreational drug use.  Wear Seat belts while driving.  Please note You were cared for by a hospitalist during your hospital stay. If you have any questions about  your discharge medications or the care you received while you were in the hospital after you are discharged, you can call the unit and asked to speak with the hospitalist on call if the hospitalist that took care of you is not available. Once you are discharged, your primary care physician will handle any further medical issues. Please note that NO REFILLS for any discharge medications will be authorized once you are discharged, as it is imperative  that you return to your primary care physician (or establish a relationship with a primary care physician if you do not have one) for your aftercare needs so that they can reassess your need for medications and monitor your lab values.  You can reach the hospitalist office at phone [phone] or fax [phone]   If you do not have a primary care physician, you can call [phone] for a physician referral.

## 2016-09-06 NOTE — Progress Notes (Signed)
Chaplain Visit the result of a [place] in EPIC dated 03 September 2016 for assistance with a health care power of attorney. This action was completed prior to [name] being discharged. A copy of the completed form was given to the unit to be scanned into [name]'s chart and the original returned to her.  Sallee Lange. [place], DMin, MDiv, MA Weekend Chaplain

## 2016-09-08 LAB — CULTURE, BLOOD (ROUTINE X 2)
Culture: NO GROWTH
Culture: NO GROWTH

## 2016-09-09 ENCOUNTER — Ambulatory Visit (HOSPITAL_BASED_OUTPATIENT_CLINIC_OR_DEPARTMENT_OTHER): Payer: BLUE CROSS/BLUE SHIELD | Admitting: Certified Registered"

## 2016-09-09 ENCOUNTER — Encounter (HOSPITAL_BASED_OUTPATIENT_CLINIC_OR_DEPARTMENT_OTHER)
Admission: AD | Disposition: A | Discharge status: Home / Self Care | Payer: Self-pay | Source: Ambulatory Visit | Attending: Orthopedic Surgery

## 2016-09-09 ENCOUNTER — Encounter (HOSPITAL_BASED_OUTPATIENT_CLINIC_OR_DEPARTMENT_OTHER): Payer: Self-pay

## 2016-09-09 ENCOUNTER — Ambulatory Visit (HOSPITAL_BASED_OUTPATIENT_CLINIC_OR_DEPARTMENT_OTHER)
Admission: AD | Admit: 2016-09-09 | Discharge: 2016-09-09 | Disposition: A | Discharge status: Home / Self Care | Payer: BLUE CROSS/BLUE SHIELD | Source: Ambulatory Visit | Attending: Orthopedic Surgery | Admitting: Orthopedic Surgery

## 2016-09-09 DIAGNOSIS — I96 Gangrene, not elsewhere classified: Secondary | ICD-10-CM | POA: Insufficient documentation

## 2016-09-09 DIAGNOSIS — M609 Myositis, unspecified: Secondary | ICD-10-CM | POA: Diagnosis not present

## 2016-09-09 DIAGNOSIS — Z811 Family history of alcohol abuse and dependence: Secondary | ICD-10-CM | POA: Diagnosis not present

## 2016-09-09 DIAGNOSIS — Z885 Allergy status to narcotic agent status: Secondary | ICD-10-CM | POA: Insufficient documentation

## 2016-09-09 DIAGNOSIS — Z79899 Other long term (current) drug therapy: Secondary | ICD-10-CM | POA: Insufficient documentation

## 2016-09-09 DIAGNOSIS — Z806 Family history of leukemia: Secondary | ICD-10-CM | POA: Diagnosis not present

## 2016-09-09 DIAGNOSIS — Z823 Family history of stroke: Secondary | ICD-10-CM | POA: Insufficient documentation

## 2016-09-09 DIAGNOSIS — Z8673 Personal history of transient ischemic attack (TIA), and cerebral infarction without residual deficits: Secondary | ICD-10-CM | POA: Insufficient documentation

## 2016-09-09 DIAGNOSIS — E669 Obesity, unspecified: Secondary | ICD-10-CM | POA: Insufficient documentation

## 2016-09-09 DIAGNOSIS — E1152 Type 2 diabetes mellitus with diabetic peripheral angiopathy with gangrene: Secondary | ICD-10-CM | POA: Insufficient documentation

## 2016-09-09 DIAGNOSIS — E1142 Type 2 diabetes mellitus with diabetic polyneuropathy: Secondary | ICD-10-CM | POA: Diagnosis not present

## 2016-09-09 DIAGNOSIS — G2581 Restless legs syndrome: Secondary | ICD-10-CM | POA: Insufficient documentation

## 2016-09-09 DIAGNOSIS — Z87891 Personal history of nicotine dependence: Secondary | ICD-10-CM | POA: Diagnosis not present

## 2016-09-09 DIAGNOSIS — I10 Essential (primary) hypertension: Secondary | ICD-10-CM | POA: Diagnosis not present

## 2016-09-09 DIAGNOSIS — Z87442 Personal history of urinary calculi: Secondary | ICD-10-CM | POA: Insufficient documentation

## 2016-09-09 DIAGNOSIS — Z7982 Long term (current) use of aspirin: Secondary | ICD-10-CM | POA: Diagnosis not present

## 2016-09-09 DIAGNOSIS — Z6826 Body mass index (BMI) 26.0-26.9, adult: Secondary | ICD-10-CM | POA: Diagnosis not present

## 2016-09-09 DIAGNOSIS — Z833 Family history of diabetes mellitus: Secondary | ICD-10-CM | POA: Insufficient documentation

## 2016-09-09 DIAGNOSIS — Z8261 Family history of arthritis: Secondary | ICD-10-CM | POA: Insufficient documentation

## 2016-09-09 DIAGNOSIS — E785 Hyperlipidemia, unspecified: Secondary | ICD-10-CM | POA: Insufficient documentation

## 2016-09-09 DIAGNOSIS — Z7984 Long term (current) use of oral hypoglycemic drugs: Secondary | ICD-10-CM | POA: Insufficient documentation

## 2016-09-09 DIAGNOSIS — Z8489 Family history of other specified conditions: Secondary | ICD-10-CM | POA: Diagnosis not present

## 2016-09-09 DIAGNOSIS — Z794 Long term (current) use of insulin: Secondary | ICD-10-CM | POA: Insufficient documentation

## 2016-09-09 DIAGNOSIS — F329 Major depressive disorder, single episode, unspecified: Secondary | ICD-10-CM | POA: Insufficient documentation

## 2016-09-09 DIAGNOSIS — R011 Cardiac murmur, unspecified: Secondary | ICD-10-CM | POA: Diagnosis not present

## 2016-09-09 DIAGNOSIS — Z888 Allergy status to other drugs, medicaments and biological substances status: Secondary | ICD-10-CM | POA: Diagnosis not present

## 2016-09-09 HISTORY — PX: AMPUTATION TOE: SHX6595

## 2016-09-09 LAB — GLUCOSE, CAPILLARY
Glucose-Capillary: 51 mg/dL — ABNORMAL LOW (ref 65–99)
Glucose-Capillary: 116 mg/dL — ABNORMAL HIGH (ref 65–99)
Glucose-Capillary: 106 mg/dL — ABNORMAL HIGH (ref 65–99)
Glucose-Capillary: 69 mg/dL (ref 65–99)

## 2016-09-09 SURGERY — AMPUTATION, TOE
Anesthesia: General | Laterality: Left

## 2016-09-09 MED ORDER — MIDAZOLAM HCL 2 MG/2ML IJ SOLN
INTRAMUSCULAR | Status: AC
  Filled 2016-09-09: qty 2

## 2016-09-09 MED ORDER — LIDOCAINE 2% (20 MG/ML) 5 ML SYRINGE
INTRAMUSCULAR | Status: AC
  Filled 2016-09-09: qty 5

## 2016-09-09 MED ORDER — SCOPOLAMINE 1 MG/3DAYS TD PT72: 1.0000 | MEDICATED_PATCH | Freq: Once | TRANSDERMAL | Status: DC

## 2016-09-09 MED ORDER — MIDAZOLAM HCL 2 MG/2ML IJ SOLN
1.0000 mg | INTRAMUSCULAR | Status: DC | PRN
  Administered 2016-09-09: 1 mg via INTRAVENOUS

## 2016-09-09 MED ORDER — DEXTROSE 50 % IV SOLN
1.0000 | Freq: Once | INTRAVENOUS | Status: AC
  Administered 2016-09-09: 50 mL via INTRAVENOUS

## 2016-09-09 MED ORDER — SCOPOLAMINE 1 MG/3DAYS TD PT72
MEDICATED_PATCH | TRANSDERMAL | Status: AC
  Filled 2016-09-09: qty 1

## 2016-09-09 MED ORDER — PROMETHAZINE HCL 25 MG/ML IJ SOLN: 6.2500 mg | INTRAMUSCULAR | Status: DC | PRN

## 2016-09-09 MED ORDER — SCOPOLAMINE 1 MG/3DAYS TD PT72
1.0000 | MEDICATED_PATCH | Freq: Once | TRANSDERMAL | Status: AC
  Administered 2016-09-09: 1 via TRANSDERMAL

## 2016-09-09 MED ORDER — CLINDAMYCIN HCL 300 MG PO CAPS: 300.0000 mg | ORAL_CAPSULE | Freq: Four times a day (QID) | ORAL | 0 refills | Status: DC

## 2016-09-09 MED ORDER — DEXTROSE 50 % IV SOLN
INTRAVENOUS | Status: AC
  Filled 2016-09-09: qty 50

## 2016-09-09 MED ORDER — MIDAZOLAM HCL 2 MG/2ML IJ SOLN: 0.5000 mg | Freq: Once | INTRAMUSCULAR | Status: DC | PRN

## 2016-09-09 MED ORDER — CEFAZOLIN SODIUM-DEXTROSE 2-4 GM/100ML-% IV SOLN
INTRAVENOUS | Status: AC
  Filled 2016-09-09: qty 100

## 2016-09-09 MED ORDER — BUPIVACAINE HCL (PF) 0.5 % IJ SOLN
INTRAMUSCULAR | Status: AC
  Filled 2016-09-09: qty 30

## 2016-09-09 MED ORDER — EPHEDRINE 5 MG/ML INJ
INTRAVENOUS | Status: AC
  Filled 2016-09-09: qty 10

## 2016-09-09 MED ORDER — FENTANYL CITRATE (PF) 100 MCG/2ML IJ SOLN
INTRAMUSCULAR | Status: AC
  Filled 2016-09-09: qty 2

## 2016-09-09 MED ORDER — CEFAZOLIN SODIUM-DEXTROSE 2-4 GM/100ML-% IV SOLN
2.0000 g | INTRAVENOUS | Status: AC
  Administered 2016-09-09: 2 g via INTRAVENOUS

## 2016-09-09 MED ORDER — ONDANSETRON HCL 4 MG/2ML IJ SOLN
INTRAMUSCULAR | Status: AC
  Filled 2016-09-09: qty 2

## 2016-09-09 MED ORDER — HYDROCODONE-ACETAMINOPHEN 5-325 MG PO TABS: 1.0000 | ORAL_TABLET | Freq: Four times a day (QID) | ORAL | 0 refills | Status: DC | PRN

## 2016-09-09 MED ORDER — FENTANYL CITRATE (PF) 100 MCG/2ML IJ SOLN: 25.0000 ug | INTRAMUSCULAR | Status: DC | PRN

## 2016-09-09 MED ORDER — FENTANYL CITRATE (PF) 100 MCG/2ML IJ SOLN
50.0000 ug | INTRAMUSCULAR | Status: DC | PRN
  Administered 2016-09-09: 50 ug via INTRAVENOUS

## 2016-09-09 MED ORDER — LACTATED RINGERS IV SOLN
INTRAVENOUS | Status: DC
  Administered 2016-09-09 (×2): via INTRAVENOUS

## 2016-09-09 MED ORDER — LIDOCAINE 2% (20 MG/ML) 5 ML SYRINGE
INTRAMUSCULAR | Status: DC | PRN
  Administered 2016-09-09: 20 mg via INTRAVENOUS

## 2016-09-09 MED ORDER — CIPROFLOXACIN HCL 500 MG PO TABS: 500.0000 mg | ORAL_TABLET | Freq: Two times a day (BID) | ORAL | 0 refills | Status: DC

## 2016-09-09 MED ORDER — CHLORHEXIDINE GLUCONATE 4 % EX LIQD: 60.0000 mL | Freq: Once | CUTANEOUS | Status: DC

## 2016-09-09 MED ORDER — MEPERIDINE HCL 25 MG/ML IJ SOLN: 6.2500 mg | INTRAMUSCULAR | Status: DC | PRN

## 2016-09-09 MED ORDER — BUPIVACAINE HCL (PF) 0.5 % IJ SOLN
INTRAMUSCULAR | Status: DC | PRN
  Administered 2016-09-09: 7 mL

## 2016-09-09 MED ORDER — EPHEDRINE SULFATE 50 MG/ML IJ SOLN
INTRAMUSCULAR | Status: DC | PRN
  Administered 2016-09-09: 10 mg via INTRAVENOUS
  Administered 2016-09-09: 5 mg via INTRAVENOUS

## 2016-09-09 MED ORDER — PROPOFOL 10 MG/ML IV BOLUS
INTRAVENOUS | Status: DC | PRN
  Administered 2016-09-09: 200 mg via INTRAVENOUS

## 2016-09-09 MED ORDER — POVIDONE-IODINE 10 % EX SWAB: 2.0000 "application " | Freq: Once | CUTANEOUS | Status: DC

## 2016-09-09 SURGICAL SUPPLY — 46 items
BANDAGE COBAN STERILE 2 (GAUZE/BANDAGES/DRESSINGS) IMPLANT
BLADE AVERAGE 25X9 (BLADE) IMPLANT
BLADE MINI RND TIP GREEN BEAV (BLADE) IMPLANT
BLADE OSC/SAG .038X5.5 CUT EDG (BLADE) IMPLANT
BLADE SURG 15 STRL LF DISP TIS (BLADE) ×2 IMPLANT
BLADE SURG 15 STRL SS (BLADE) ×2
BNDG COHESIVE 4X5 TAN STRL (GAUZE/BANDAGES/DRESSINGS) ×2 IMPLANT
BNDG ESMARK 4X9 LF (GAUZE/BANDAGES/DRESSINGS) ×2 IMPLANT
BNDG GAUZE 1X2.1 STRL (MISCELLANEOUS) IMPLANT
BNDG GAUZE ELAST 4 BULKY (GAUZE/BANDAGES/DRESSINGS) ×2 IMPLANT
CHLORAPREP W/TINT 26ML (MISCELLANEOUS) ×2 IMPLANT
CORDS BIPOLAR (ELECTRODE) ×2 IMPLANT
COVER BACK TABLE 60X90IN (DRAPES) ×2 IMPLANT
COVER MAYO STAND STRL (DRAPES) ×2 IMPLANT
CUFF TOURNIQUET SINGLE 18IN (TOURNIQUET CUFF) ×2 IMPLANT
DRAIN PENROSE 1/2X12 LTX STRL (WOUND CARE) IMPLANT
DRAPE EXTREMITY T 121X128X90 (DRAPE) ×2 IMPLANT
DRAPE SURG 17X23 STRL (DRAPES) IMPLANT
DRSG EMULSION OIL 3X3 NADH (GAUZE/BANDAGES/DRESSINGS) ×2 IMPLANT
GLOVE BIO SURGEON STRL SZ7.5 (GLOVE) ×2 IMPLANT
GLOVE BIOGEL PI IND STRL 7.0 (GLOVE) ×1 IMPLANT
GLOVE BIOGEL PI IND STRL 8 (GLOVE) ×1 IMPLANT
GLOVE BIOGEL PI INDICATOR 7.0 (GLOVE) ×1
GLOVE BIOGEL PI INDICATOR 8 (GLOVE) ×1
GLOVE ECLIPSE 6.5 STRL STRAW (GLOVE) ×2 IMPLANT
GOWN STRL REUS W/ TWL LRG LVL3 (GOWN DISPOSABLE) ×2 IMPLANT
GOWN STRL REUS W/TWL LRG LVL3 (GOWN DISPOSABLE) ×2
GOWN STRL REUS W/TWL XL LVL3 (GOWN DISPOSABLE) ×2 IMPLANT
NEEDLE HYPO 25X1 1.5 SAFETY (NEEDLE) ×2 IMPLANT
NS IRRIG 1000ML POUR BTL (IV SOLUTION) ×2 IMPLANT
PACK BASIN DAY SURGERY FS (CUSTOM PROCEDURE TRAY) ×2 IMPLANT
PADDING CAST ABS 4INX4YD NS (CAST SUPPLIES)
PADDING CAST ABS COTTON 4X4 ST (CAST SUPPLIES) IMPLANT
RUBBERBAND STERILE (MISCELLANEOUS) IMPLANT
SLEEVE SCD COMPRESS KNEE MED (MISCELLANEOUS) ×2 IMPLANT
SPONGE GAUZE 4X4 12PLY STER LF (GAUZE/BANDAGES/DRESSINGS) ×2 IMPLANT
STOCKINETTE 6  STRL (DRAPES) ×1
STOCKINETTE 6 STRL (DRAPES) ×1 IMPLANT
SUT ETHILON 4 0 PS 2 18 (SUTURE) ×4 IMPLANT
SUT VICRYL RAPIDE 4-0 (SUTURE) IMPLANT
SUT VICRYL RAPIDE 4/0 PS 2 (SUTURE) IMPLANT
SYR 10ML LL (SYRINGE) ×2 IMPLANT
SYR BULB 3OZ (MISCELLANEOUS) ×2 IMPLANT
TOWEL OR 17X24 6PK STRL BLUE (TOWEL DISPOSABLE) ×2 IMPLANT
TOWEL OR NON WOVEN STRL DISP B (DISPOSABLE) ×2 IMPLANT
UNDERPAD 30X30 (UNDERPADS AND DIAPERS) ×2 IMPLANT

## 2016-09-09 NOTE — Transfer of Care (Signed)
Immediate Anesthesia Transfer of Care Note  Patient: [name]  Procedure(s) Performed: Procedure(s): AMPUTATION OF LEFT GREAT TOE (Left)  Patient Location: PACU  Anesthesia Type:General  Level of Consciousness: awake, alert  and patient cooperative  Airway & Oxygen Therapy: Patient Spontanous Breathing and Patient connected to face mask oxygen  Post-op Assessment: Report given to RN, Post -op Vital signs reviewed and stable and Patient moving all extremities  Post vital signs: Reviewed and stable  Last Vitals:  Vitals:   09/09/16 1032  BP: (!) 160/75  Pulse: 88  Resp: 18  Temp: 36.9 C    Last Pain:  Vitals:   09/09/16 1032  TempSrc: Oral         Complications: No apparent anesthesia complications

## 2016-09-09 NOTE — Anesthesia Postprocedure Evaluation (Signed)
Anesthesia Post Note  Patient: [name]  Procedure(s) Performed: Procedure(s) (LRB): AMPUTATION OF LEFT GREAT TOE (Left)  Patient location during evaluation: PACU Anesthesia Type: General Level of consciousness: awake and alert, patient cooperative and oriented Pain management: pain level controlled Vital Signs Assessment: post-procedure vital signs reviewed and stable Respiratory status: spontaneous breathing, nonlabored ventilation and respiratory function stable Cardiovascular status: blood pressure returned to baseline and stable Postop Assessment: no signs of nausea or vomiting Anesthetic complications: no       Last Vitals:  Vitals:   09/09/16 1328 09/09/16 1330  BP:  (!) 150/90  Pulse: 87 (!) 108  Resp:  17  Temp:      Last Pain:  Vitals:   09/09/16 1330  TempSrc:   PainSc: 0-No pain                 JACKSON,E. CARSWELL

## 2016-09-09 NOTE — Discharge Instructions (Signed)
Discharge Instructions   You have a light dressing on your foot. Use your hard soled shoe for walking. You may begin gentle motion of your ankle immediately.  Elevate your foot to reduce pain & swelling.  Ice over the operative site may be helpful to reduce pain & swelling.  DO NOT USE HEAT. Pain medicine has been prescribed for you.  Use your medicine as needed over the first 48 hours, and then you can begin to taper your use. You may use Tylenol in place of your prescribed pain medication, but not IN ADDITION to it. Leave the dressing in place until the third day after your surgery and then remove the post operative bandage. Replace with light bandage as long as there is drainage. DO NOT USE NON STICK bandages, but dry gauze. After the bandage has been removed you may shower, regularly washing the incision and letting the water run over it, but not submerging it (no swimming, soaking it in dishwater, etc.) You may drive a car when you are off of prescription pain medications and can safely control your vehicle. You may have already made your follow-up appointment when we completed your preop visit.  If not, please call our office today or the next business day to make your return appointment for 10-15 days after surgery.   Please call [phone] during normal business hours or [phone] after hours for any problems. Including the following:  - excessive redness of the incisions - drainage for more than 4 days - fever of more than 101.5 F  *Please note that pain medications will not be refilled after hours or on weekends.   Post Anesthesia Home Care Instructions  Activity: Get plenty of rest for the remainder of the day. A responsible adult should stay with you for 24 hours following the procedure.  For the next 24 hours, DO NOT: -Drive a car -Paediatric nurse -Drink alcoholic beverages -Take any medication unless instructed by your physician -Make any legal decisions or sign  important papers.  Meals: Start with liquid foods such as gelatin or soup. Progress to regular foods as tolerated. Avoid greasy, spicy, heavy foods. If nausea and/or vomiting occur, drink only clear liquids until the nausea and/or vomiting subsides. Call your physician if vomiting continues.  Special Instructions/Symptoms: Your throat may feel dry or sore from the anesthesia or the breathing tube placed in your throat during surgery. If this causes discomfort, gargle with warm salt water. The discomfort should disappear within 24 hours.  If you had a scopolamine patch placed behind your ear for the management of post- operative nausea and/or vomiting:  1. The medication in the patch is effective for 72 hours, after which it should be removed.  Wrap patch in a tissue and discard in the trash. Wash hands thoroughly with soap and water. 2. You may remove the patch earlier than 72 hours if you experience unpleasant side effects which may include dry mouth, dizziness or visual disturbances. 3. Avoid touching the patch. Wash your hands with soap and water after contact with the patch.

## 2016-09-09 NOTE — Anesthesia Preprocedure Evaluation (Signed)
Anesthesia Evaluation  Patient identified by MRN, date of birth, ID band Patient awake    Reviewed: Allergy & Precautions, NPO status , Patient's Chart, lab work & pertinent test results  History of Anesthesia Complications Negative for: history of anesthetic complications  Airway Mallampati: I  TM Distance: >3 FB Neck ROM: Full    Dental  (+) Lower Dentures, Upper Dentures, Dental Advisory Given   Pulmonary former smoker,    breath sounds clear to auscultation       Cardiovascular hypertension, Pt. on medications  Rhythm:Regular Rate:Normal  7/17 ECHO: EF 60-65%, valves oK   Neuro/Psych Depression H/o ICH CVA, No Residual Symptoms    GI/Hepatic negative GI ROS, Neg liver ROS,   Endo/Other  diabetes (glu 51: D50 given IV), Insulin Dependent, Oral Hypoglycemic Agents  Renal/GU negative Renal ROS     Musculoskeletal   Abdominal   Peds  Hematology  (+) Blood dyscrasia (Hb 10.3), ,   Anesthesia Other Findings   Reproductive/Obstetrics                             Anesthesia Physical Anesthesia Plan  ASA: III  Anesthesia Plan: General   Post-op Pain Management:    Induction: Intravenous  Airway Management Planned: LMA  Additional Equipment:   Intra-op Plan:   Post-operative Plan:   Informed Consent: I have reviewed the patients History and Physical, chart, labs and discussed the procedure including the risks, benefits and alternatives for the proposed anesthesia with the patient or authorized representative who has indicated his/her understanding and acceptance.   Dental advisory given  Plan Discussed with: CRNA and Surgeon  Anesthesia Plan Comments: (Plan routine monitors, GA- LMA OK)        Anesthesia Quick Evaluation

## 2016-09-09 NOTE — Interval H&P Note (Signed)
History and Physical Interval Note:  09/09/2016 12:17 PM  [name]  has presented today for surgery, with the diagnosis of LEFT GREAT TOE NECROSIS  The various methods of treatment have been discussed with the patient and family. After consideration of risks, benefits and other options for treatment, the patient has consented to  Procedure(s): AMPUTATION OF LEFT GREAT TOE (Left) as a surgical intervention .  The patient's history has been reviewed, patient examined, no change in status, stable for surgery.  I have reviewed the patient's chart and labs.  Questions were answered to the patient's satisfaction.     THOMPSON, DAVID A.

## 2016-09-09 NOTE — Op Note (Signed)
09/09/2016  12:17 PM  PATIENT:  [name]  51 y.o. female  PRE-OPERATIVE DIAGNOSIS:  Left great toe necrosis  POST-OPERATIVE DIAGNOSIS:  Same  PROCEDURE:  Left great toe amputation through proximal phalanx  SURGEON: Rayvon Char. Grandville Silos, MD  PHYSICIAN ASSISTANT: Morley Kos, OPA-C  ANESTHESIA:  general  SPECIMENS:  Ischemic toe to pathology; swabs to microbiology  DRAINS:   None  EBL:  less than 50 mL  PREOPERATIVE INDICATIONS:  [name] is a  52 y.o. female with left great toe plantar necrosis with infection, who has improved somewhat on antibiotics, and now presents for definitive surgical management  The risks benefits and alternatives were discussed with the patient preoperatively including but not limited to the risks of infection, bleeding, nerve injury, cardiopulmonary complications, the need for revision surgery, among others, and the patient verbalized understanding and consented to proceed.  OPERATIVE IMPLANTS: none  OPERATIVE PROCEDURE:  After receiving prophylactic antibiotics, the patient was escorted to the operative theatre and placed in a supine position.  General anesthesia was administered. A surgical "time-out" was performed during which the planned procedure, proposed operative site, and the correct patient identity were compared to the operative consent and agreement confirmed by the circulating nurse according to current facility policy.  The lower extremity was pre-scrub with Hibiclens scrub brush before being formally prepped with ChloraPrep and draped in usual sterile fashion.  It was examined with an Esmarch bandage which was left around the ankle as a tourniquet.  The epidermal layers of skin were thick and cornified, and were removed from the plantar and medial and lateral surfaces of the toe first.  Next, the full-thickness plantar necrosis was excised, revealing dead necrotic adipose tissue beneath it.  Decision was made to proceed with  amputation.  I first excised the paronychial skin and nail complex together with the distal phalanx.  This was passed off for pathologic evaluation as well as to obtain swabs for microbiology for cultures, both aerobic and anaerobic.  The flexor hallucis longus was then pulled distally and transected is much proximally as I could.  The distal half of the proximal phalanx was shortened with a bone cutter and a rongeur to allow for coverage with the soft tissues available.  A medial proximally based flap was fashioned from what remained.  The wound is copiously irrigated and the tourniquet removed.  All of the skin seemed to pink up appropriately and there was some bleeding at the distal skin edges.  The flap was inset and reapproximated with 4-0 nylon interrupted sutures.  Half percent plain Marcaine was instilled around the base of the great toe stump for postoperative pain control.  A light loose dressing was applied and she was taken to the recovery room in stable condition, breathing spontaneously.  DISPOSITION: She'll be discharged home today with typical instructions, weightbearing as tolerated in a hard soled shoe, returning in 10-15 days for reevaluation and suture removal of the wound appears appropriate.  She will continue on Cipro and clindamycin for an additional week beyond what she has on hand.

## 2016-09-09 NOTE — Anesthesia Procedure Notes (Signed)
Procedure Name: LMA Insertion Date/Time: 09/09/2016 12:43 PM Performed by: Baxter Flattery Pre-anesthesia Checklist: Patient identified, Emergency Drugs available, Suction available and Patient being monitored Patient Re-evaluated:Patient Re-evaluated prior to inductionOxygen Delivery Method: Circle system utilized Preoxygenation: Pre-oxygenation with 100% oxygen Intubation Type: IV induction Ventilation: Mask ventilation without difficulty LMA: LMA inserted LMA Size: 3.0 Number of attempts: 1 Airway Equipment and Method: Bite block Placement Confirmation: positive ETCO2 and breath sounds checked- equal and bilateral Tube secured with: Tape Dental Injury: Teeth and Oropharynx as per pre-operative assessment

## 2016-09-09 NOTE — H&P (View-Only) (Signed)
ORTHOPAEDIC CONSULTATION HISTORY & PHYSICAL REQUESTING PHYSICIAN: Orson Eva, MD  Chief Complaint: right toe osteomyelitis  HPI: HPI: 50 year old female with a history of diabetes mellitus, hyperlipidemia, hypertension, peripheral vascular disease, and intracerebral hemorrhage presented with 2 week history of left foot infection. The patient initially noted split skin on the bottom of her left great toe after raking leaves in the yard on 08/26/2016. She proceeded to her primary care provider, Dr. [name] that day and was noted to have a temperature 101.54F. She was sent to the emergency department on the same day where a bedside debridement was performed. She was discharged on Bactrim and cephalexin. She returnedto the emergency department on 08/28/2016 for reported positive blood culture. The patient was noted to be clinically stable, and her blood culture was thought to be a contaminant. The patient was discharged home with clindamycin in addition to her Bactrim and cephalexin. She then followed-up with her PCP on 09/02/2016, not improving, and was admitted to the hospital for continued eval and care.  MRI was c/w osteomyelitis of the distal phalanx of the left great toe.  She was evaluated yesterday by Dr. Lynann Bologna from our practice.Interestingly, 3 years ago she had protracted wound care for spontaneously developing lesion on the anterolateral aspect of the left leg, about a third of the way from the ankle towards the knee.  Earlier this year, she underwent something similar with almost identical localization on the right side.  Past Medical History:  Diagnosis Date  . Acute bronchitis 07/08/2011  . COMMON MIGRAINE 10/07/2010  . Depression 12/18/2012  . Diabetes mellitus type II, uncontrolled ([place]) 10/07/2010   Qualifier: Diagnosis of  By: Charlett Blake MD, [name]    . Diabetic foot infection ([place]) 08/26/2016  . Disturbance of skin sensation 10/07/2010  . HEART MURMUR, HX OF 10/07/2010  . Hyperlipidemia  12/06/2010  . Hypertension   . NEPHROLITHIASIS, HX OF 10/07/2010  . Overweight(278.02) 12/06/2010  . PERIPHERAL NEUROPATHY, FEET 10/07/2010  . PVD (peripheral vascular disease) ([place]) 01/21/2012  . RESTLESS LEG SYNDROME 10/25/2010  . SOB (shortness of breath) 04/01/2012  . Stroke [facility]) 2014, 2017   most recently in 2/17 - intracerebral hemorrhage  . Weakness of right side of body 04/19/2013   Past Surgical History:  Procedure Laterality Date  . WISDOM TOOTH EXTRACTION     Social History   Social History  . Marital status: Single    Spouse name: N/A  . Number of children: N/A  . Years of education: N/A   Occupational History  . machine operator    Social History Main Topics  . Smoking status: Former Smoker    Packs/day: 0.50    Quit date: 09/08/2005  . Smokeless tobacco: Never Used  . Alcohol use No  . Drug use: No  . Sexual activity: Yes    Partners: Male    Birth control/ protection: None   Other Topics Concern  . None   Social History Narrative   Lives with husband in a two story home.  Has one child.     Works as a Glass blower/designer.     Education: high school.   Family History  Problem Relation Age of Onset  . Arthritis Mother   . Stroke Brother     previous smoker  . Alcohol abuse Brother     in remission  . Leukemia Brother   . Diabetes Paternal Grandmother   . Healthy Son    Allergies  Allergen Reactions  . Lyrica [Pregabalin] Other (See Comments)  MADE PATIENT VERY EMOTIONAL AND WOULD CRY EASILY  . Tramadol Nausea And Vomiting    Pt cant tolerate this pain med.   . Morphine And Related Nausea And Vomiting   Prior to Admission medications   Medication Sig Start Date End Date Taking? Authorizing Provider  aspirin EC 81 MG tablet Take 81 mg by mouth daily.   Yes Historical Provider, MD  Insulin Glargine (LANTUS SOLOSTAR) 100 UNIT/ML Solostar Pen Inject 50 Units into the skin 2 (two) times daily. 10/09/15  Yes Mosie Lukes, MD  lisinopril  (PRINIVIL,ZESTRIL) 10 MG tablet TAKE 1 TABLET (10 MG TOTAL) BY MOUTH 2 (TWO) TIMES DAILY. 04/01/16  Yes Mosie Lukes, MD  metFORMIN (GLUCOPHAGE) 1000 MG tablet TAKE 1 TABLET TWICE DAILY AND 1/2 TABLET AT NOON 04/01/16  Yes Mosie Lukes, MD  simvastatin (ZOCOR) 5 MG tablet Take 1 tablet (5 mg total) by mouth daily. 05/19/16  Yes Mosie Lukes, MD  glucose blood (ONE TOUCH ULTRA TEST) test strip Use once daily to check blood sugar.  DX E11.9 01/04/16   Mosie Lukes, MD  Insulin Pen Needle 32G X 4 MM MISC Use as directed for insulin.DX E11.65 03/03/16   Mosie Lukes, MD  Lancets (FREESTYLE) lancets DX- 250.02  Pts machine is Freestyle Freedom Lite 04/20/13   Mosie Lukes, MD   Mr Foot Left W Wo Contrast  Result Date: 09/04/2016 CLINICAL DATA:  Large open wound on the plantar aspect of the great toe. EXAM: MRI OF THE LEFT FOREFOOT WITHOUT AND WITH CONTRAST TECHNIQUE: Multiplanar, multisequence MR imaging of the left foot was performed both before and after administration of intravenous contrast. CONTRAST:  61mL MULTIHANCE GADOBENATE DIMEGLUMINE 529 MG/ML IV SOLN COMPARISON:  Radiographs 09/03/2016 FINDINGS: There is a large open wound involving the plantar aspect of the great toe. There is associated moderate diffuse subcutaneous soft tissue swelling/edema and enhancement suggesting cellulitis. The actual wound at itself does not demonstrate any enhancement suggesting necrotic tissue. Underlying edema like marrow signal abnormality and enhancement of distal phalanx is consistent with osteomyelitis. No definite findings for septic arthritis, septic tenosynovitis or pyomyositis. Moderate diffuse myofasciitis. Mild diffuse signal abnormality in the second metatarsal shaft. This may be some type of stress reaction. I do not see any significant enhancement. I doubt this is osteomyelitis. It is more likely a stress reaction. IMPRESSION: 1. Large open wound involving the plantar aspect of the great toe.  Nonenhancement of the tissue in the wound suggests tissue necrosis. 2. Osteomyelitis involving the distal phalanx of the great toe. 3. Nonspecific signal abnormality in the second metatarsal shaft. I think this is unlikely osteomyelitis and more likely some type of stress reaction but post observation is suggested. 4. No discrete drainable soft tissue abscess or pyomyositis. Electronically Signed   By: Marijo Sanes M.D.   On: 09/04/2016 18:33    Positive ROS: All other systems have been reviewed and were otherwise negative with the exception of those mentioned in the HPI and as above.  Physical Exam: Vitals: Refer to EMR. Constitutional:  WD, WN, NAD HEENT:  NCAT, EOMI Neuro/Psych:  Alert & oriented to person, place, and time; appropriate mood & affect Lymphatic: No generalized extremity edema or lymphadenopathy Extremities / MSK:  The extremities are normal with respect to appearance, ranges of motion, joint stability, muscle strength/tone, sensation, & perfusion except as otherwise noted:  Left great toe with 3cm area of full-thickness necrotic skin on the plantar surface, centered over the proximal  phalanx.  The skin distal to that towards the tip is not necrotic, but there is some partial-thickness skin loss with developing blistering wrapping around the toe on its lateral side.  The toe is swollen, which extends into the midfoot and forefoot, but by her report is much less than previously.  There is also some spreading redness from the toe proximally, which seems to have receded from the skin markings made at one point.  Dorsalis pedis pulse is fairly strongly palpable.  Assessment: Left great toe osteomyelitis of the distal phalanx with rather large area of full-thickness skin necrosis on the plantar surface, with overlying and more proximal cellulitis/subcutaneous edema  Plan: I discussed these findings with her.  We discussed the pros and cons of options.  There are 2 sets of problems  impacting resolution, one is eradication of the infection, and the other is how to deal with the ongoing skin necrosis.  I indicated to her that I think it is very unlikely that she can achieve resolution of both portions of this problem with just IV antibiotics alone, and limited debridement is also unlikely to be successful.  I recommended she consider amputation of the hallux at this time, as it will even be challenging to close suction amputation primarily as it is, and any further skin loss might make that prohibitive, requiring some partial amputation of the metatarsal.  After our discussion, she indicated that she would like to proceed with amputation of the great toe.  I have recommended that we allow a few more days for medical treatment for her ascending cellulitis/lymphangitis to further improve that, and plan to proceed with outpatient left great toe amputation at the [facility] on Tuesday.  She consented to proceed.  I would think that she could be discharged on appropriate oral antibiotic regimen so long as she continues to improve clinically, either late on Saturday or Sunday.  I instructed her to be NPO after midnight on Monday night, to take only half of her normal morning insulin dose Tuesday, and to arrive at the surgery center at 10:30 AM on Tuesday.  Rayvon Char Grandville Silos, [place] [address], [place]  [address] Office: [phone] Mobile: [phone]  09/05/2016, 10:37 PM

## 2016-09-10 ENCOUNTER — Encounter (HOSPITAL_BASED_OUTPATIENT_CLINIC_OR_DEPARTMENT_OTHER): Payer: Self-pay | Admitting: Orthopedic Surgery

## 2016-09-10 ENCOUNTER — Telehealth: Payer: Self-pay

## 2016-09-10 NOTE — Telephone Encounter (Signed)
Cologuard order has been cancelled due to inactivity. Ordered on 06-27-2016 office visit.

## 2016-09-11 NOTE — Telephone Encounter (Signed)
ok 

## 2016-09-12 ENCOUNTER — Encounter: Payer: Self-pay | Admitting: Medical

## 2016-09-12 ENCOUNTER — Ambulatory Visit (INDEPENDENT_AMBULATORY_CARE_PROVIDER_SITE_OTHER): Payer: BLUE CROSS/BLUE SHIELD | Admitting: Medical

## 2016-09-12 VITALS — BP 145/61 | HR 99 | Temp 98.5°F | Resp 16 | Ht 62.0 in | Wt 151.4 lb

## 2016-09-12 DIAGNOSIS — S98132D Complete traumatic amputation of one left lesser toe, subsequent encounter: Secondary | ICD-10-CM | POA: Diagnosis not present

## 2016-09-12 MED ORDER — TRAMADOL HCL 50 MG PO TABS: 50.0000 mg | ORAL_TABLET | Freq: Four times a day (QID) | ORAL | 0 refills | Status: DC | PRN

## 2016-09-12 NOTE — Telephone Encounter (Signed)
Would you let them know that Dr. Charlett Blake pt pcp wanted her to be seen next week due to her complicated history and diabetes. She thought follow up with ortho most appropiate rather than with [name]. So can other provider/md in office see her next week.

## 2016-09-12 NOTE — Progress Notes (Signed)
Pre visit review using our clinic review tool, if applicable. No additional management support is needed unless otherwise documented below in the visit note/SLS  

## 2016-09-12 NOTE — Addendum Note (Signed)
Addended by: Rockwell Germany on: 09/12/2016 04:46 PM   Modules accepted: Orders

## 2016-09-12 NOTE — Progress Notes (Signed)
Subjective:    [name], female    DOB: 07-Apr-1966, 51 y.o.   MRN: [id number]  HPI  Pt in for evaluation. She had a toe amputation. On September 09, 2016 by Dr. Grandville Silos at [facility]. Before the procedure she had very red foot. Pt was referred to  [facility] by Dr. Charlett Blake. Ortho sent to ED. They hospitalized her for 4-5 days and gave iv antibiotics. Mri showed infection of bone tip of left great toe. Then after 5 days she had left great toe amputation.  No fever, no chills or sweats. No pain.   Dr. Grandville Silos wanted her to just remove the wrap and follow up on September 22, 2016.  Pt was given ciprofloxin and clindamyin.  Pt sugars have running 100 when she checks.On insulin and metformin.  Pt being compliant on diet.  Pt states can take tramadol. Took last night and no side effects.    Review of Systems  Constitutional: Negative for chills, fatigue and fever.  Respiratory: Negative for cough, choking, chest tightness and shortness of breath.   Cardiovascular: Negative for palpitations.  Musculoskeletal:       Rt great toe amputation.  Neurological: Negative for dizziness and headaches.  Hematological: Negative for adenopathy. Does not bruise/bleed easily.     Past Medical History:  Diagnosis Date  . Acute bronchitis 07/08/2011  . COMMON MIGRAINE 10/07/2010  . Depression 12/18/2012  . Diabetes mellitus type II, uncontrolled ([place]) 10/07/2010   Qualifier: Diagnosis of  By: Charlett Blake MD, [name]    . Diabetic foot infection ([place]) 08/26/2016  . Disturbance of skin sensation 10/07/2010  . HEART MURMUR, HX OF 10/07/2010  . Hyperlipidemia 12/06/2010  . Hypertension   . NEPHROLITHIASIS, HX OF 10/07/2010  . Overweight(278.02) 12/06/2010  . PERIPHERAL NEUROPATHY, FEET 10/07/2010  . PVD (peripheral vascular disease) ([place]) 01/21/2012  . RESTLESS LEG SYNDROME 10/25/2010  . SOB (shortness of breath) 04/01/2012  . Stroke [facility]) 2014, 2017   most recently in 2/17  - intracerebral hemorrhage  . Weakness of right side of body 04/19/2013     Social History   Social History  . Marital status: Single    Spouse name: N/A  . Number of children: N/A  . Years of education: N/A   Occupational History  . machine operator    Social History Main Topics  . Smoking status: Former Smoker    Packs/day: 0.50    Quit date: 09/08/2005  . Smokeless tobacco: Never Used  . Alcohol use No  . Drug use: No  . Sexual activity: Yes    Partners: Male    Birth control/ protection: None   Other Topics Concern  . Not on file   Social History Narrative   Lives with husband in a two story home.  Has one child.     Works as a Glass blower/designer.     Education: high school.    Past Surgical History:  Procedure Laterality Date  . AMPUTATION TOE Left 09/09/2016   Procedure: AMPUTATION OF LEFT GREAT TOE;  Surgeon: Milly Jakob, MD;  Location: [facility];  Service: Orthopedics;  Laterality: Left;  . WISDOM TOOTH EXTRACTION      Family History  Problem Relation Age of Onset  . Arthritis Mother   . Stroke Brother     previous smoker  . Alcohol abuse Brother     in remission  . Leukemia Brother   . Diabetes Paternal Grandmother   . Healthy Son  Allergies  Allergen Reactions  . Lyrica [Pregabalin] Other (See Comments)    MADE PATIENT VERY EMOTIONAL AND WOULD CRY EASILY  . Tramadol Nausea And Vomiting    Pt cant tolerate this pain med.   . Morphine And Related Nausea And Vomiting    Current Outpatient Prescriptions on File Prior to Visit  Medication Sig Dispense Refill  . ciprofloxacin (CIPRO) 500 MG tablet Take 1 tablet (500 mg total) by mouth 2 (two) times daily. 14 tablet 0  . clindamycin (CLEOCIN) 300 MG capsule Take 1 capsule (300 mg total) by mouth 4 (four) times daily. 28 capsule 0  . glucose blood (ONE TOUCH ULTRA TEST) test strip Use once daily to check blood sugar.  DX E11.9 100 each 11  . insulin glargine (LANTUS) 100 UNIT/ML  injection Inject 0.45 mLs (45 Units total) into the skin 2 (two) times daily. 10 mL 11  . Insulin Pen Needle 32G X 4 MM MISC Use as directed for insulin.DX E11.65 100 each 6  . Lancets (FREESTYLE) lancets DX- 250.02  Pts machine is Freestyle Freedom Lite 100 each 5  . lisinopril (PRINIVIL,ZESTRIL) 20 MG tablet Take 1 tablet (20 mg total) by mouth daily. (Patient taking differently: Take 10 mg by mouth 2 (two) times daily. ) 30 tablet 0  . metFORMIN (GLUCOPHAGE) 1000 MG tablet TAKE 1 TABLET TWICE DAILY AND 1/2 TABLET AT NOON 75 tablet 1  . simvastatin (ZOCOR) 5 MG tablet Take 1 tablet (5 mg total) by mouth daily. 30 tablet 1  . aspirin EC 81 MG tablet Take 81 mg by mouth daily.     No current facility-administered medications on file prior to visit.     BP (!) 145/61 (BP Location: Left Arm, Patient Position: Sitting, Cuff Size: Large)   Pulse 99   Temp 98.5 F (36.9 C) (Oral)   Resp 16   Ht 5\' 2"  (1.575 m)   Wt 151 lb 6 oz (68.7 kg)   LMP 02/12/2011 (Exact Date)   SpO2 98%   BMI 27.69 kg/m       Objective:   Physical Exam  General- No acute distress. Pleasant patient. Neck- Full range of motion, no jvd Lungs- Clear, even and unlabored. Heart- regular rate and rhythm. Neurologic- CNII- XII grossly intact.   Left lower ext- foot not red.Some faint redness at 1st metatarsal area. Wound looks dry and clean. Not warm and not tender. Small area serosangious drainage cultured.      Assessment & Plan:   We removed dressing and cleaned wound. The area looks like healing well. Since we are following up from ortho decided to go ahead and get wound culture.   We re-wrapped the wound. But got a culture and will send out.  Continue the clindamycin and cipro.  I am going to try to get you back in with [facility] next week as follow up with them is appropiate.  Tramadol rx for pain  Follow up with [name] prior to ortho appointment if needed.   [place]ier, Percell Miller, PA-C

## 2016-09-12 NOTE — Patient Instructions (Addendum)
We removed dressing and cleaned wound. The area looks like healing well. Since we are following up from ortho decided to go ahead and get wound culture.   We re-wrapped the wound. But got a culture and will send out.  Continue the clindamycin and cipro.  I am going to try to get you back in with [facility] next week as follow up with them is appropiate.  Tramadol rx for pain  Follow up with [name] prior to ortho appointment if needed.

## 2016-09-12 NOTE — Telephone Encounter (Signed)
[name] does not have any appointments available before 09/22/16

## 2016-09-14 LAB — AEROBIC/ANAEROBIC CULTURE W GRAM STAIN (SURGICAL/DEEP WOUND)

## 2016-09-14 LAB — AEROBIC/ANAEROBIC CULTURE (SURGICAL/DEEP WOUND)

## 2016-09-15 ENCOUNTER — Telehealth: Payer: Self-pay | Admitting: Family Medicine

## 2016-09-15 ENCOUNTER — Other Ambulatory Visit: Payer: Self-pay

## 2016-09-15 DIAGNOSIS — T148XXA Other injury of unspecified body region, initial encounter: Secondary | ICD-10-CM

## 2016-09-15 LAB — WOUND CULTURE
Gram Stain: NONE SEEN
Gram Stain: NONE SEEN
Organism ID, Bacteria: NO GROWTH

## 2016-09-15 NOTE — Telephone Encounter (Signed)
Patient request a call back from the nurse. States she forgot to ask a question when speaking this morning. [phone]

## 2016-09-15 NOTE — Telephone Encounter (Signed)
Pt called in returning nurse call.

## 2016-09-15 NOTE — Telephone Encounter (Signed)
Spoke with patient she just wanted to clarify that the infection on her big toe was not going to spread to her family. Patient has referral to infectious diease, patient is waiting on ofc to give her a call.  PC

## 2016-09-15 NOTE — Progress Notes (Signed)
Patient has been notified or her lab results patient has referral put in for infectious disease.  PC

## 2016-09-18 ENCOUNTER — Telehealth: Payer: Self-pay | Admitting: Family Medicine

## 2016-09-18 NOTE — Telephone Encounter (Signed)
Caller name: Relationship to patient: [facility] Can be reached: [phone] Pharmacy:  Reason for call: FYI: [facility] called to inform PCP that after evaluating the patient it was determined that what she has is not a wound and not infectious. States that a note has been placed in Epic.

## 2016-09-22 ENCOUNTER — Telehealth: Payer: Self-pay | Admitting: Family Medicine

## 2016-09-22 NOTE — Telephone Encounter (Signed)
Relation to [id number] Call back number:[phone]  Reason for call:  Patient checking on the status of [employer] forms, requesting forms re fax to [phone] Attention Dr. Charlett Blake

## 2016-09-23 ENCOUNTER — Telehealth: Payer: Self-pay | Admitting: Family Medicine

## 2016-09-23 NOTE — Telephone Encounter (Signed)
I have not seen her since all of this first started. I would imagine the surgeon would have to release her first then I could. Would need an appt for reevaluation, I cannot release without seeing the wound and checking vitals

## 2016-09-23 NOTE — Telephone Encounter (Signed)
Pt dropped off documents to be filled out [employer]. Pt would like to have paperwork fax to [phone] and to let her know when document was fax tel [phone]. Document was put at front office tray.

## 2016-09-23 NOTE — Telephone Encounter (Addendum)
Paperwork received and forwarded to PCP for review and completion.  Per provider's note from yesterday (09/22/16), she would like for patient to come back to office for reevaluation.    Please call patient and schedule an office visit with Charlett Blake.

## 2016-09-23 NOTE — Telephone Encounter (Signed)
Pt called in, she says that she is trying to complete her paperwork but is unsure of when she was released to return to work per Dr. Jacinto Reap. She would like a call back for further assistance.    CB: [phone]

## 2016-09-23 NOTE — Telephone Encounter (Signed)
Advise on return to work date

## 2016-09-29 NOTE — Telephone Encounter (Signed)
Appt scheduled for 09/30/16

## 2016-09-30 ENCOUNTER — Encounter: Payer: Self-pay | Admitting: Family Medicine

## 2016-09-30 ENCOUNTER — Ambulatory Visit (INDEPENDENT_AMBULATORY_CARE_PROVIDER_SITE_OTHER): Payer: BLUE CROSS/BLUE SHIELD | Admitting: Family Medicine

## 2016-09-30 ENCOUNTER — Telehealth: Payer: Self-pay | Admitting: Family Medicine

## 2016-09-30 VITALS — BP 142/82 | HR 97 | Temp 98.6°F | Ht 62.25 in | Wt 151.8 lb

## 2016-09-30 DIAGNOSIS — D171 Benign lipomatous neoplasm of skin and subcutaneous tissue of trunk: Secondary | ICD-10-CM

## 2016-09-30 DIAGNOSIS — E782 Mixed hyperlipidemia: Secondary | ICD-10-CM

## 2016-09-30 DIAGNOSIS — E0842 Diabetes mellitus due to underlying condition with diabetic polyneuropathy: Secondary | ICD-10-CM

## 2016-09-30 DIAGNOSIS — G5791 Unspecified mononeuropathy of right lower limb: Secondary | ICD-10-CM

## 2016-09-30 DIAGNOSIS — G5792 Unspecified mononeuropathy of left lower limb: Secondary | ICD-10-CM

## 2016-09-30 DIAGNOSIS — I10 Essential (primary) hypertension: Secondary | ICD-10-CM

## 2016-09-30 DIAGNOSIS — M86172 Other acute osteomyelitis, left ankle and foot: Secondary | ICD-10-CM

## 2016-09-30 DIAGNOSIS — D638 Anemia in other chronic diseases classified elsewhere: Secondary | ICD-10-CM

## 2016-09-30 DIAGNOSIS — G5793 Unspecified mononeuropathy of bilateral lower limbs: Secondary | ICD-10-CM

## 2016-09-30 MED ORDER — GABAPENTIN 100 MG PO CAPS: 100.0000 mg | ORAL_CAPSULE | Freq: Every day | ORAL | 3 refills | Status: DC

## 2016-09-30 NOTE — Progress Notes (Signed)
Subjective:    [name], female    DOB: 03-Jul-1966, 51 y.o.   MRN: [id number]  Chief Complaint  Patient presents with  . Follow-up    HPI Patient is in today for follow up on numerous medical concerns including a diabetic foot infection, peripheral neuropathy, Hypertension and more. She is noting her blood sugars have improved since she stopped working and her infection has been dealt with with an amputation of her great toe. She notes her am sugars are ranging from 100 to 120. No polyuria or polydipsia. She is noting persistent leg pain but also some persistent numbness in her feet and legs. She is following with general surgery as her surgical site heals. She is also noting a lump on her RLQ abdominal wall that has been present roughly 2 months. It has not grown since she noted it. No redness or warmth. It is mildly tender with palpation. Denies CP/palp/SOB/HA/congestion/fevers/GI or GU c/o. Taking meds as prescribed. bowles are moving well.    Past Medical History:  Diagnosis Date  . Acute bronchitis 07/08/2011  . COMMON MIGRAINE 10/07/2010  . Depression 12/18/2012  . Diabetes mellitus type II, uncontrolled ([place]) 10/07/2010   Qualifier: Diagnosis of  By: Charlett Blake MD, [name]    . Diabetic foot infection ([place]) 08/26/2016  . Disturbance of skin sensation 10/07/2010  . HEART MURMUR, HX OF 10/07/2010  . Hyperlipidemia 12/06/2010  . Hypertension   . Lipoma of abdominal wall 10/05/2016  . NEPHROLITHIASIS, HX OF 10/07/2010  . Overweight(278.02) 12/06/2010  . PERIPHERAL NEUROPATHY, FEET 10/07/2010  . PVD (peripheral vascular disease) ([place]) 01/21/2012  . RESTLESS LEG SYNDROME 10/25/2010  . SOB (shortness of breath) 04/01/2012  . Stroke [facility]) 2014, 2017   most recently in 2/17 - intracerebral hemorrhage  . Weakness of right side of body 04/19/2013    Past Surgical History:  Procedure Laterality Date  . AMPUTATION TOE Left 09/09/2016   Procedure: AMPUTATION OF LEFT GREAT TOE;   Surgeon: Milly Jakob, MD;  Location: [facility];  Service: Orthopedics;  Laterality: Left;  . WISDOM TOOTH EXTRACTION      Family History  Problem Relation Age of Onset  . Arthritis Mother   . Stroke Brother     previous smoker  . Alcohol abuse Brother     in remission  . Leukemia Brother   . Diabetes Paternal Grandmother   . Healthy Son     Social History   Social History  . Marital status: Single    Spouse name: N/A  . Number of children: N/A  . Years of education: N/A   Occupational History  . machine operator    Social History Main Topics  . Smoking status: Former Smoker    Packs/day: 0.50    Quit date: 09/08/2005  . Smokeless tobacco: Never Used  . Alcohol use No  . Drug use: No  . Sexual activity: Yes    Partners: Male    Birth control/ protection: None   Other Topics Concern  . Not on file   Social History Narrative   Lives with husband in a two story home.  Has one child.     Works as a Glass blower/designer.     Education: high school.    Outpatient Medications Prior to Visit  Medication Sig Dispense Refill  . aspirin EC 81 MG tablet Take 81 mg by mouth daily.    Marland Kitchen glucose blood (ONE TOUCH ULTRA TEST) test strip Use once daily  to check blood sugar.  DX E11.9 100 each 11  . insulin glargine (LANTUS) 100 UNIT/ML injection Inject 0.45 mLs (45 Units total) into the skin 2 (two) times daily. 10 mL 11  . Insulin Pen Needle 32G X 4 MM MISC Use as directed for insulin.DX E11.65 100 each 6  . Lancets (FREESTYLE) lancets DX- 250.02  Pts machine is Freestyle Freedom Lite 100 each 5  . lisinopril (PRINIVIL,ZESTRIL) 20 MG tablet Take 1 tablet (20 mg total) by mouth daily. (Patient taking differently: Take 10 mg by mouth 2 (two) times daily. ) 30 tablet 0  . metFORMIN (GLUCOPHAGE) 1000 MG tablet TAKE 1 TABLET TWICE DAILY AND 1/2 TABLET AT NOON 75 tablet 1  . simvastatin (ZOCOR) 5 MG tablet Take 1 tablet (5 mg total) by mouth daily. 30 tablet 1  .  traMADol (ULTRAM) 50 MG tablet Take 1 tablet (50 mg total) by mouth every 6 (six) hours as needed. 30 tablet 0  . ciprofloxacin (CIPRO) 500 MG tablet Take 1 tablet (500 mg total) by mouth 2 (two) times daily. 14 tablet 0  . clindamycin (CLEOCIN) 300 MG capsule Take 1 capsule (300 mg total) by mouth 4 (four) times daily. 28 capsule 0   No facility-administered medications prior to visit.     Allergies  Allergen Reactions  . Lyrica [Pregabalin] Other (See Comments)    MADE PATIENT VERY EMOTIONAL AND WOULD CRY EASILY  . Tramadol Nausea And Vomiting    Pt cant tolerate this pain med.   . Morphine And Related Nausea And Vomiting    Review of Systems  Constitutional: Positive for malaise/fatigue. Negative for fever.  HENT: Negative for congestion.   Eyes: Negative for blurred vision.  Respiratory: Negative for cough and shortness of breath.   Cardiovascular: Negative for chest pain, palpitations and leg swelling.  Gastrointestinal: Positive for abdominal pain. Negative for vomiting.  Musculoskeletal: Negative for back pain.  Skin: Negative for rash.  Neurological: Positive for sensory change. Negative for loss of consciousness and headaches.       Objective:    Physical Exam  Constitutional: She is oriented to person, place, and time. She appears well-developed and well-nourished. No distress.  HENT:  Head: Normocephalic and atraumatic.  Eyes: Conjunctivae are normal.  Neck: Normal range of motion. No thyromegaly present.  Cardiovascular: Normal rate and regular rhythm.   Pulmonary/Chest: Effort normal and breath sounds normal. She has no wheezes.  Abdominal: Soft. Bowel sounds are normal. There is no tenderness.  Musculoskeletal: Normal range of motion. She exhibits no edema or deformity.  Neurological: She is alert and oriented to person, place, and time.  Skin: Skin is warm and dry. She is not diaphoretic.  2 cm lesion on abdominal wall, rlq, firm, round, mildly tender lesion    Psychiatric: She has a normal mood and affect.    BP (!) 142/82 (BP Location: Left Arm, Patient Position: Sitting, Cuff Size: Normal)   Pulse 97   Temp 98.6 F (37 C) (Oral)   Ht 5' 2.25" (1.581 m)   Wt 151 lb 12.8 oz (68.9 kg)   LMP 02/12/2011 (Exact Date)   SpO2 96%   BMI 27.54 kg/m  Wt Readings from Last 3 Encounters:  09/30/16 151 lb 12.8 oz (68.9 kg)  09/12/16 151 lb 6 oz (68.7 kg)  09/09/16 146 lb 9.6 oz (66.5 kg)     Lab Results  Component Value Date   WBC 8.5 09/05/2016   HGB 10.3 (L) 09/05/2016  HCT 31.1 (L) 09/05/2016   PLT 609 (H) 09/05/2016   GLUCOSE 83 09/06/2016   CHOL 175 03/30/2016   TRIG 70 03/30/2016   HDL 38 (L) 03/30/2016   LDLDIRECT 136.4 04/01/2012   LDLCALC 123 (H) 03/30/2016   ALT 17 09/03/2016   AST 15 09/03/2016   NA 139 09/06/2016   K 4.4 09/06/2016   CL 105 09/06/2016   CREATININE 0.58 09/06/2016   BUN 19 09/06/2016   CO2 27 09/06/2016   TSH 2.15 11/20/2015   INR 1.07 03/30/2016   HGBA1C 10.4 (H) 09/04/2016   MICROALBUR 1.1 12/22/2011    Lab Results  Component Value Date   TSH 2.15 11/20/2015   Lab Results  Component Value Date   WBC 8.5 09/05/2016   HGB 10.3 (L) 09/05/2016   HCT 31.1 (L) 09/05/2016   MCV 86.6 09/05/2016   PLT 609 (H) 09/05/2016   Lab Results  Component Value Date   NA 139 09/06/2016   K 4.4 09/06/2016   CO2 27 09/06/2016   GLUCOSE 83 09/06/2016   BUN 19 09/06/2016   CREATININE 0.58 09/06/2016   BILITOT 0.3 09/03/2016   ALKPHOS 84 09/03/2016   AST 15 09/03/2016   ALT 17 09/03/2016   PROT 7.4 09/03/2016   ALBUMIN 3.0 (L) 09/03/2016   CALCIUM 8.8 (L) 09/06/2016   ANIONGAP 7 09/06/2016   GFR 107.46 11/27/2015   Lab Results  Component Value Date   CHOL 175 03/30/2016   Lab Results  Component Value Date   HDL 38 (L) 03/30/2016   Lab Results  Component Value Date   LDLCALC 123 (H) 03/30/2016   Lab Results  Component Value Date   TRIG 70 03/30/2016   Lab Results  Component Value Date    CHOLHDL 4.6 03/30/2016   Lab Results  Component Value Date   HGBA1C 10.4 (H) 09/04/2016  I acted as a Education administrator for Dr. Charlett Blake. [name], RMA      Assessment & Plan:   Problem List Items Addressed This Visit    Anemia of chronic disease - Primary (Chronic)   Essential hypertension, benign    Well controlled, no changes to meds. Encouraged heart healthy diet such as the DASH diet and exercise as tolerated.       Relevant Orders   CBC   Comprehensive metabolic panel   TSH   Hyperlipidemia    Tolerating statin, encouraged heart healthy diet, avoid trans fats, minimize simple carbs and saturated fats. Increase exercise as tolerated      Relevant Orders   Lipid panel   Neuropathic pain of both legs    Will start Gabapentin 100 mg po qhs and increase to 300 mg as tolerated and as needed      Diabetic polyneuropathy associated with diabetes mellitus due to underlying condition (HCC)   Relevant Medications   gabapentin (NEURONTIN) 100 MG capsule   Other Relevant Orders   Hemoglobin A1c   Ambulatory referral to Endocrinology   Acute osteomyelitis of toe, left ([place])    Is healing slowly and following closely with surgeon. Will be out of work for the foreseeable future.       Lipoma of abdominal wall    RLQ, firm nontender lesion circumscribed lesion 2 cm in diameter noted c/w lipoma. She will report if it grows or worsens.         I have discontinued Ms. Dismuke's ciprofloxacin, clindamycin, and LANTUS SOLOSTAR. I am also having her start on gabapentin. Additionally, I am having  her maintain her freestyle, glucose blood, Insulin Pen Needle, metFORMIN, aspirin EC, simvastatin, insulin glargine, lisinopril, traMADol, and HYDROcodone-acetaminophen.  Meds ordered this encounter  Medications  . DISCONTD: LANTUS SOLOSTAR 100 UNIT/ML Solostar Pen    Sig: Inject 45 mg as directed 2 (two) times daily.  Marland Kitchen HYDROcodone-acetaminophen (NORCO/VICODIN) 5-325 MG tablet    Sig: Take 1  tablet by mouth as needed.  . gabapentin (NEURONTIN) 100 MG capsule    Sig: Take 1-3 capsules (100-300 mg total) by mouth at bedtime.    Dispense:  90 capsule    Refill:  3    CMA served as scribe during this visit. History, Physical and Plan performed by medical provider. Documentation and orders reviewed and attested to.  Penni Homans, MD

## 2016-09-30 NOTE — Patient Instructions (Signed)
Carbohydrate Counting for Diabetes Mellitus, Adult Carbohydrate counting is a method for keeping track of how many carbohydrates you eat. Eating carbohydrates naturally increases the amount of sugar (glucose) in the blood. Counting how many carbohydrates you eat helps keep your blood glucose within normal limits, which helps you manage your diabetes (diabetes mellitus). It is important to know how many carbohydrates you can safely have in each meal. This is different for every person. A diet and nutrition specialist (registered dietitian) can help you make a meal plan and calculate how many carbohydrates you should have at each meal and snack. Carbohydrates are found in the following foods:  Grains, such as breads and cereals.  Dried beans and soy products.  Starchy vegetables, such as potatoes, peas, and corn.  Fruit and fruit juices.  Milk and yogurt.  Sweets and snack foods, such as cake, cookies, candy, chips, and soft drinks. How do I count carbohydrates? There are two ways to count carbohydrates in food. You can use either of the methods or a combination of both. Reading "Nutrition Facts" on packaged food  The "Nutrition Facts" list is included on the labels of almost all packaged foods and beverages in the [place] It includes:  The serving size.  Information about nutrients in each serving, including the grams (g) of carbohydrate per serving. To use the "Nutrition Facts":  Decide how many servings you will have.  Multiply the number of servings by the number of carbohydrates per serving.  The resulting number is the total amount of carbohydrates that you will be having. Learning standard serving sizes of other foods  When you eat foods containing carbohydrates that are not packaged or do not include "Nutrition Facts" on the label, you need to measure the servings in order to count the amount of carbohydrates:  Measure the foods that you will eat with a food scale or measuring  cup, if needed.  Decide how many standard-size servings you will eat.  Multiply the number of servings by 15. Most carbohydrate-rich foods have about 15 g of carbohydrates per serving.  For example, if you eat 8 oz (170 g) of strawberries, you will have eaten 2 servings and 30 g of carbohydrates (2 servings x 15 g = 30 g).  For foods that have more than one food mixed, such as soups and casseroles, you must count the carbohydrates in each food that is included. The following list contains standard serving sizes of common carbohydrate-rich foods. Each of these servings has about 15 g of carbohydrates:   hamburger bun or  English muffin.   oz (15 mL) syrup.   oz (14 g) jelly.  1 slice of bread.  1 six-inch tortilla.  3 oz (85 g) cooked rice or pasta.  4 oz (113 g) cooked dried beans.  4 oz (113 g) starchy vegetable, such as peas, corn, or potatoes.  4 oz (113 g) hot cereal.  4 oz (113 g) mashed potatoes or  of a large baked potato.  4 oz (113 g) canned or frozen fruit.  4 oz (120 mL) fruit juice.  4-6 crackers.  6 chicken nuggets.  6 oz (170 g) unsweetened dry cereal.  6 oz (170 g) plain fat-free yogurt or yogurt sweetened with artificial sweeteners.  8 oz (240 mL) milk.  8 oz (170 g) fresh fruit or one small piece of fruit.  24 oz (680 g) popped popcorn. Example of carbohydrate counting Sample meal  3 oz (85 g) chicken breast.  6 oz (  170 g) brown rice.  4 oz (113 g) corn.  8 oz (240 mL) milk.  8 oz (170 g) strawberries with sugar-free whipped topping. Carbohydrate calculation 1. Identify the foods that contain carbohydrates:  Rice.  Corn.  Milk.  Strawberries. 2. Calculate how many servings you have of each food:  2 servings rice.  1 serving corn.  1 serving milk.  1 serving strawberries. 3. Multiply each number of servings by 15 g:  2 servings rice x 15 g = 30 g.  1 serving corn x 15 g = 15 g.  1 serving milk x 15 g = 15  g.  1 serving strawberries x 15 g = 15 g. 4. Add together all of the amounts to find the total grams of carbohydrates eaten:  30 g + 15 g + 15 g + 15 g = 75 g of carbohydrates total. This information is not intended to replace advice given to you by your health care provider. Make sure you discuss any questions you have with your health care provider. Document Released: 08/25/2005 Document Revised: 03/14/2016 Document Reviewed: 02/06/2016 Elsevier Interactive Patient Education  2017 Elsevier Inc.  

## 2016-09-30 NOTE — Telephone Encounter (Signed)
Patient has appointment with PCP today 09/30/2016 to complete paperwork.

## 2016-09-30 NOTE — Assessment & Plan Note (Signed)
Will start Gabapentin 100 mg po qhs and increase to 300 mg as tolerated and as needed

## 2016-09-30 NOTE — Progress Notes (Signed)
Pre visit review using our clinic review tool, if applicable. No additional management support is needed unless otherwise documented below in the visit note. 

## 2016-09-30 NOTE — Telephone Encounter (Signed)
Faxed patients disability/short term FMLA to [employer] at [phone]. Copied and on Angies desk/original as well.

## 2016-10-01 MED FILL — GABAPENTIN 100 MG CAPSULE: 100 | 30 days supply | Qty: 90 | Fill #0

## 2016-10-05 ENCOUNTER — Encounter: Payer: Self-pay | Admitting: Family Medicine

## 2016-10-05 DIAGNOSIS — D171 Benign lipomatous neoplasm of skin and subcutaneous tissue of trunk: Secondary | ICD-10-CM | POA: Insufficient documentation

## 2016-10-05 HISTORY — DX: Benign lipomatous neoplasm of skin and subcutaneous tissue of trunk: D17.1

## 2016-10-05 NOTE — Assessment & Plan Note (Signed)
Well controlled, no changes to meds. Encouraged heart healthy diet such as the DASH diet and exercise as tolerated.  °

## 2016-10-05 NOTE — Assessment & Plan Note (Signed)
RLQ, firm nontender lesion circumscribed lesion 2 cm in diameter noted c/w lipoma. She will report if it grows or worsens.

## 2016-10-05 NOTE — Assessment & Plan Note (Signed)
Is healing slowly and following closely with surgeon. Will be out of work for the foreseeable future.

## 2016-10-05 NOTE — Assessment & Plan Note (Signed)
Tolerating statin, encouraged heart healthy diet, avoid trans fats, minimize simple carbs and saturated fats. Increase exercise as tolerated 

## 2016-10-08 ENCOUNTER — Ambulatory Visit: Payer: BLUE CROSS/BLUE SHIELD | Admitting: Neurology

## 2016-10-09 ENCOUNTER — Telehealth: Payer: Self-pay | Admitting: Family Medicine

## 2016-10-09 ENCOUNTER — Encounter: Payer: Self-pay | Admitting: Family Medicine

## 2016-10-09 ENCOUNTER — Other Ambulatory Visit: Payer: Self-pay | Admitting: Family Medicine

## 2016-10-09 DIAGNOSIS — M545 Low back pain: Secondary | ICD-10-CM

## 2016-10-09 NOTE — Telephone Encounter (Signed)
Relation to [id number] Call back number: [phone]   Reason for call:  Patient requesting referral to sports medicine located in [place], patient states she would like to go to the previous sports medicine in [place] (chart doesn't reflect) due to back pain.

## 2016-10-10 ENCOUNTER — Encounter: Payer: Self-pay | Admitting: Neurology

## 2016-10-13 ENCOUNTER — Other Ambulatory Visit (INDEPENDENT_AMBULATORY_CARE_PROVIDER_SITE_OTHER): Payer: BLUE CROSS/BLUE SHIELD

## 2016-10-13 ENCOUNTER — Encounter (INDEPENDENT_AMBULATORY_CARE_PROVIDER_SITE_OTHER): Payer: Self-pay | Admitting: Orthopedic Surgery

## 2016-10-13 ENCOUNTER — Ambulatory Visit (INDEPENDENT_AMBULATORY_CARE_PROVIDER_SITE_OTHER): Payer: BLUE CROSS/BLUE SHIELD | Admitting: Orthopedic Surgery

## 2016-10-13 VITALS — Ht 62.0 in | Wt 151.0 lb

## 2016-10-13 DIAGNOSIS — M4726 Other spondylosis with radiculopathy, lumbar region: Secondary | ICD-10-CM

## 2016-10-13 DIAGNOSIS — E0842 Diabetes mellitus due to underlying condition with diabetic polyneuropathy: Secondary | ICD-10-CM

## 2016-10-13 DIAGNOSIS — I10 Essential (primary) hypertension: Secondary | ICD-10-CM | POA: Diagnosis not present

## 2016-10-13 DIAGNOSIS — E782 Mixed hyperlipidemia: Secondary | ICD-10-CM | POA: Diagnosis not present

## 2016-10-13 LAB — LIPID PANEL
Cholesterol: 224 mg/dL — ABNORMAL HIGH (ref 0–200)
HDL: 51.5 mg/dL (ref >39.00)
LDL Cholesterol: 150 mg/dL — ABNORMAL HIGH (ref 0–99)
NonHDL: 172.49
Total CHOL/HDL Ratio: 4
Triglycerides: 112 mg/dL (ref 0.0–149.0)
VLDL: 22.4 mg/dL (ref 0.0–40.0)

## 2016-10-13 LAB — COMPREHENSIVE METABOLIC PANEL
ALT: 25 U/L (ref 0–35)
AST: 18 U/L (ref 0–37)
Albumin: 4 g/dL (ref 3.5–5.2)
Alkaline Phosphatase: 81 U/L (ref 39–117)
BUN: 18 mg/dL (ref 6–23)
CO2: 28 mEq/L (ref 19–32)
Calcium: 9.8 mg/dL (ref 8.4–10.5)
Chloride: 105 mEq/L (ref 96–112)
Creatinine, Ser: 0.57 mg/dL (ref 0.40–1.20)
GFR: 118 mL/min (ref >60.00)
Glucose, Bld: 105 mg/dL — ABNORMAL HIGH (ref 70–99)
Potassium: 4.1 mEq/L (ref 3.5–5.1)
Sodium: 142 mEq/L (ref 135–145)
Total Bilirubin: 0.4 mg/dL (ref 0.2–1.2)
Total Protein: 7.8 g/dL (ref 6.0–8.3)

## 2016-10-13 LAB — CBC
HCT: 36.9 % (ref 36.0–46.0)
Hemoglobin: 12.1 g/dL (ref 12.0–15.0)
MCHC: 32.9 g/dL (ref 30.0–36.0)
MCV: 87.4 fl (ref 78.0–100.0)
Platelets: 393 10*3/uL (ref 150.0–400.0)
RBC: 4.22 Mil/uL (ref 3.87–5.11)
RDW: 14.1 % (ref 11.5–15.5)
WBC: 8 10*3/uL (ref 4.0–10.5)

## 2016-10-13 LAB — TSH: TSH: 2.82 u[IU]/mL (ref 0.35–4.50)

## 2016-10-13 LAB — HEMOGLOBIN A1C: Hgb A1c MFr Bld: 8.8 % — ABNORMAL HIGH (ref 4.6–6.5)

## 2016-10-13 MED ORDER — PREDNISONE 10 MG PO TABS: 10.0000 mg | ORAL_TABLET | Freq: Every day | ORAL | 1 refills | Status: DC

## 2016-10-13 NOTE — Progress Notes (Signed)
Office Visit Note   Patient: [name]           Date of Birth: 03-12-1966           MRN: [id number] Visit Date: 10/13/2016              Requested by: Mosie Lukes, MD [address] [place], [place] [address] PCP: Penni Homans, MD  Chief Complaint  Patient presents with  . Lower Back - Pain    HPI: Patient presents today with low back pain ongoing for two weeks now. She has no known injury. She has been evaluated in the office in September 2017 for left-sided L4-L5 HNP with moderate spinal stenosis. She was evaluated later by Dr. Ernestina Patches but her symptoms had resolved and decided against epidural injections at that time. She denies any numbness or tingling. She complains of pain radiating to left foot. Her pain worsens with walking and is lessened in the seating position. Patient states she is currently taking ibuprofen for her pain. Maxcine Ham, RT  Patient states that when she is sitting she is fine she feels a pulling sensation in her left foot she is status post toe amputation about 3 weeks ago. She does have type 2 diabetes controlled with insulin.  Assessment & Plan: Visit Diagnoses:  1. Other spondylosis with radiculopathy, lumbar region     Plan: We will have her start on prednisone she will adjust her insulin. Discussed that this will increase her blood sugars. Follow-up as needed if she is still symptomatic we will evaluate for epidural steroid injections with Dr. Ernestina Patches.  Follow-Up Instructions: Return if symptoms worsen or fail to improve.   Ortho Exam Examination patient is alert oriented no adenopathy well-dressed normal affect normal respiratory effort she has normal gait. Examination she has no pain with range of motion hip knee or ankle she has a negative straight leg raise she has good motor strength in all motor groups of both lower extremities. Previous radiographs shows degenerative disc disease of her lumbar spine.  Imaging: No  results found.  Orders:  No orders of the defined types were placed in this encounter.  Meds ordered this encounter  Medications  . predniSONE (DELTASONE) 10 MG tablet    Sig: Take 1 tablet (10 mg total) by mouth daily with breakfast.    Dispense:  30 tablet    Refill:  1     Procedures: No procedures performed  Clinical Data: No additional findings.  Subjective: Review of Systems  Objective: Vital Signs: Ht 5\' 2"  (1.575 m)   Wt 151 lb (68.5 kg)   LMP 02/12/2011 (Exact Date)   BMI 27.62 kg/m   Specialty Comments:  No specialty comments available.  PMFS History: Patient Active Problem List   Diagnosis Date Noted  . Other spondylosis with radiculopathy, lumbar region 10/13/2016  . Lipoma of abdominal wall 10/05/2016  . Acute osteomyelitis of toe, left ([place]) 09/05/2016  . Oral thrush 09/04/2016  . Moderate malnutrition ([place]) 09/04/2016  . Anemia of chronic disease 09/04/2016  . Cellulitis 09/03/2016  . Diabetic foot infection ([place]) 08/26/2016  . Diabetic polyneuropathy associated with diabetes mellitus due to underlying condition ([place]) 05/07/2016  . Orthostatic hypotension 05/07/2016  . Cerebrovascular disease 04/27/2016  . Nausea with vomiting 04/01/2016  . ICH (intracerebral hemorrhage) ([place]) 03/30/2016  . Nonhealing nonsurgical wound 03/06/2016  . Visit for preventive health examination 05/21/2015  . Breast cancer screening 05/21/2015  . Arm lesion 07/02/2014  .  Pain in joint, ankle and foot 04/28/2014  . Pain in limb 01/13/2014  . Neuropathic pain of both legs 01/13/2014  . Hip pain 01/12/2014  . Allergic rhinitis 03/10/2013  . Back pain 03/08/2013  . Depression 12/18/2012  . PVD (peripheral vascular disease) ([place]) 01/21/2012  . Diabetic ulcer of lower extremity ([place]) 12/22/2011  . Hyperlipidemia 12/06/2010  . Overweight(278.02) 12/06/2010  . RESTLESS LEG SYNDROME 10/25/2010  . Migraine without aura 10/07/2010  . Hereditary and idiopathic peripheral  neuropathy 10/07/2010  . Essential hypertension, benign 10/07/2010  . DISTURBANCE OF SKIN SENSATION 10/07/2010  . HEART MURMUR, HX OF 10/07/2010  . NEPHROLITHIASIS, HX OF 10/07/2010   Past Medical History:  Diagnosis Date  . Acute bronchitis 07/08/2011  . COMMON MIGRAINE 10/07/2010  . Depression 12/18/2012  . Diabetes mellitus type II, uncontrolled ([place]) 10/07/2010   Qualifier: Diagnosis of  By: Charlett Blake MD, [name]    . Diabetic foot infection ([place]) 08/26/2016  . Disturbance of skin sensation 10/07/2010  . HEART MURMUR, HX OF 10/07/2010  . Hyperlipidemia 12/06/2010  . Hypertension   . Lipoma of abdominal wall 10/05/2016  . NEPHROLITHIASIS, HX OF 10/07/2010  . Overweight(278.02) 12/06/2010  . PERIPHERAL NEUROPATHY, FEET 10/07/2010  . PVD (peripheral vascular disease) ([place]) 01/21/2012  . RESTLESS LEG SYNDROME 10/25/2010  . SOB (shortness of breath) 04/01/2012  . Stroke [facility]) 2014, 2017   most recently in 2/17 - intracerebral hemorrhage  . Weakness of right side of body 04/19/2013    Family History  Problem Relation Age of Onset  . Arthritis Mother   . Stroke Brother     previous smoker  . Alcohol abuse Brother     in remission  . Leukemia Brother   . Diabetes Paternal Grandmother   . Healthy Son     Past Surgical History:  Procedure Laterality Date  . AMPUTATION TOE Left 09/09/2016   Procedure: AMPUTATION OF LEFT GREAT TOE;  Surgeon: Milly Jakob, MD;  Location: [facility];  Service: Orthopedics;  Laterality: Left;  . WISDOM TOOTH EXTRACTION     Social History   Occupational History  . machine operator    Social History Main Topics  . Smoking status: Former Smoker    Packs/day: 0.50    Quit date: 09/08/2005  . Smokeless tobacco: Never Used  . Alcohol use No  . Drug use: No  . Sexual activity: Yes    Partners: Male    Birth control/ protection: None

## 2016-10-14 ENCOUNTER — Other Ambulatory Visit: Payer: Self-pay | Admitting: Family Medicine

## 2016-10-14 MED ORDER — SIMVASTATIN 10 MG PO TABS: 10.0000 mg | ORAL_TABLET | Freq: Every day | ORAL | 3 refills | Status: DC

## 2016-10-22 ENCOUNTER — Ambulatory Visit (INDEPENDENT_AMBULATORY_CARE_PROVIDER_SITE_OTHER): Payer: BLUE CROSS/BLUE SHIELD | Admitting: Family Medicine

## 2016-10-22 ENCOUNTER — Encounter: Payer: Self-pay | Admitting: Endocrinology

## 2016-10-22 ENCOUNTER — Encounter: Payer: Self-pay | Admitting: Family Medicine

## 2016-10-22 DIAGNOSIS — G5791 Unspecified mononeuropathy of right lower limb: Secondary | ICD-10-CM

## 2016-10-22 DIAGNOSIS — G5792 Unspecified mononeuropathy of left lower limb: Secondary | ICD-10-CM

## 2016-10-22 DIAGNOSIS — G5793 Unspecified mononeuropathy of bilateral lower limbs: Secondary | ICD-10-CM

## 2016-10-22 DIAGNOSIS — M48062 Spinal stenosis, lumbar region with neurogenic claudication: Secondary | ICD-10-CM

## 2016-10-22 DIAGNOSIS — I739 Peripheral vascular disease, unspecified: Secondary | ICD-10-CM | POA: Diagnosis not present

## 2016-10-22 MED ORDER — VITAMIN D (ERGOCALCIFEROL) 1.25 MG (50000 UNIT) PO CAPS: 50000.0000 [IU] | ORAL_CAPSULE | ORAL | 0 refills | Status: DC

## 2016-10-22 NOTE — Assessment & Plan Note (Signed)
I do believe the patient is having more of a spinal stenosis of the lumbar spine. I do think the patient is having some neurogenic claudication. Some of patient's weakness okay be secondary to her history of this cerebrovascular disease. Discussed with her at great length. Encourage her to continue the gabapentin.  Once weekly vitamin D for prophylactically as well as to help with muscle strength and endurance. We discussed icing regimen. Work with the thought trainer to learn home exercises. Patient was under the understanding to be on prednisone daily for the next 3 months. Encourage patient to discontinue the medication though with her having blood sugars in the 300s. We discussed we can do other medications if necessary. She does have tramadol for breakthrough pain. Would like to see patient again though in 2-3 weeks to make sure she is responding otherwise we'll consider epidurals in the lumbar spine.

## 2016-10-22 NOTE — Progress Notes (Signed)
[name] No Name [place], [place] [address] Phone: [phone] Subjective:    I'm seeing this patient by the request  of:  Penni Homans, MD   CC: Low back pain  [id number]  [name] is a 51 y.o. female coming in with complaint of Low back pain. Patient has had significant different medical problems recently she including a cellulitis that unfortunately led to the amputation of the left large toe. Patient states that she has had pain now going on nearly a month. Patient has been seen by an outside facility had been diagnosed with left-sided herniated disc at L4-L5 with moderate spinal stenosis. This was independently visualized by me on an MRI 05/26/2016. Patient was having radicular symptoms that sound of the provider grams 1 week ago and was given prednisone at that time. Patient is continuing on this and has noticed significant improvement. Patient though unfortunate he states that this is causing her to have significant elevation in her blood sugars. Patient is concerned because she did recently just had the amputation. Patient states that she still has more of a tingling sensation going down the left leg. States that she has weakness but this could be secondary to her CVA she had previously. Patient states that she has not notice any increasing weakness. No bowel or bladder trouble, denies any fevers chills or any abnormal weight loss.     Past Medical History:  Diagnosis Date  . Acute bronchitis 07/08/2011  . COMMON MIGRAINE 10/07/2010  . Depression 12/18/2012  . Diabetes mellitus type II, uncontrolled ([place]) 10/07/2010   Qualifier: Diagnosis of  By: Charlett Blake MD, [name]    . Diabetic foot infection ([place]) 08/26/2016  . Disturbance of skin sensation 10/07/2010  . HEART MURMUR, HX OF 10/07/2010  . Hyperlipidemia 12/06/2010  . Hypertension   . Lipoma of abdominal wall 10/05/2016  . NEPHROLITHIASIS, HX OF 10/07/2010  . Overweight(278.02) 12/06/2010   . PERIPHERAL NEUROPATHY, FEET 10/07/2010  . PVD (peripheral vascular disease) ([place]) 01/21/2012  . RESTLESS LEG SYNDROME 10/25/2010  . SOB (shortness of breath) 04/01/2012  . Stroke [facility]) 2014, 2017   most recently in 2/17 - intracerebral hemorrhage  . Weakness of right side of body 04/19/2013   Past Surgical History:  Procedure Laterality Date  . AMPUTATION TOE Left 09/09/2016   Procedure: AMPUTATION OF LEFT GREAT TOE;  Surgeon: Milly Jakob, MD;  Location: [place];  Service: Orthopedics;  Laterality: Left;  . WISDOM TOOTH EXTRACTION     Social History   Social History  . Marital status: Single    Spouse name: N/A  . Number of children: N/A  . Years of education: N/A   Occupational History  . machine operator    Social History Main Topics  . Smoking status: Former Smoker    Packs/day: 0.50    Quit date: 09/08/2005  . Smokeless tobacco: Never Used  . Alcohol use No  . Drug use: No  . Sexual activity: Yes    Partners: Male    Birth control/ protection: None   Other Topics Concern  . None   Social History Narrative   Lives with husband in a two story home.  Has one child.     Works as a Glass blower/designer.     Education: high school.   Allergies  Allergen Reactions  . Lyrica [Pregabalin] Other (See Comments)    MADE PATIENT VERY EMOTIONAL AND WOULD CRY EASILY  . Tramadol Nausea And Vomiting  Pt cant tolerate this pain med.   . Morphine And Related Nausea And Vomiting   Family History  Problem Relation Age of Onset  . Arthritis Mother   . Stroke Brother     previous smoker  . Alcohol abuse Brother     in remission  . Leukemia Brother   . Diabetes Paternal Grandmother   . Healthy Son     Past medical history, social, surgical and family history all reviewed in electronic medical record.  No pertanent information unless stated regarding to the chief complaint.   Review of Systems:Review of systems updated and as accurate as of 10/22/16  No  headache, visual changes, nausea, vomiting, diarrhea, constipation, dizziness, abdominal pain, skin rash, fevers, chills, night sweats, weight loss, swollen lymph nodes,  joint swelling,  chest pain, shortness of breath, mood changes. Positive for muscle ache in body aches.  Objective  Blood pressure (!) 140/92, pulse 82, height 5\' 2"  (1.575 m), weight 155 lb 6.4 oz (70.5 kg), last menstrual period 02/12/2011. Systems examined below as of 10/22/16   General: No apparent distress alert and oriented x3 mood and affect normal, dressed appropriately.  HEENT: Pupils equal, extraocular movements intact  Respiratory: Patient's speak in full sentences and does not appear short of breath  Cardiovascular: No lower extremity edema, non tender, no erythema  Skin: Warm dry intact with no signs of infection or rash on extremities or on axial skeleton.  Abdomen: Soft nontender  Neuro: Cranial nerves II through XII are intact, neurovascularly intact in all extremities with 2+ DTRs and 2+ pulses.  Lymph: No lymphadenopathy of posterior or anterior cervical chain or axillae bilaterally.  Gait normal with good balance and coordination.  MSK:  Non tender with full range of motion and good stability and symmetric strength and tone of shoulders, elbows, wrist, hip, knee and ankles bilaterally.  Back Exam:  Inspection: Unremarkable  Motion: Flexion 35 deg, Extension 15 deg, Side Bending to 45 deg bilaterally,  Rotation to 45 deg bilaterally  SLR laying: Negative  XSLR laying: Negative  Palpable tenderness: More tenderness on the paraspinal musculature lumbar spine lower region left side. FABER: Significant tightness of the left side. Sensory change: Gross sensation intact to all lumbar and sacral dermatomes.  Reflexes: 2+ at both patellar tendons, 2+ at achilles tendons, Babinski's downgoing.  Strength at foot  4 out of 5 strength with mild clonus noted of the left foot of 2 beats. Otherwise normal.  Procedure  note [id number]; 15 minutes spent for Therapeutic exercises as stated in above notes.  This included exercises focusing on stretching, strengthening, with significant focus on eccentric aspects.  Low back exercises that included:  Pelvic tilt/bracing instruction to focus on control of the pelvic girdle and lower abdominal muscles  Glute strengthening exercises, focusing on proper firing of the glutes without engaging the low back muscles Proper stretching techniques for maximum relief for the hamstrings, hip flexors, low back and some rotation where tolerated  Proper technique shown and discussed handout in great detail with ATC.  All questions were discussed and answered.     Impression and Recommendations:     This case required medical decision making of moderate complexity.      Note: This dictation was prepared with Dragon dictation along with smaller phrase technology. Any transcriptional errors that result from this process are unintentional.

## 2016-10-22 NOTE — Assessment & Plan Note (Signed)
Encourage patient to continue to gabapentin.

## 2016-10-22 NOTE — Assessment & Plan Note (Signed)
Possibly ABIs if no significant improvement.

## 2016-10-22 NOTE — Patient Instructions (Signed)
God to see you  Ice 20 minutes 2 times daily. Usually after activity and before bed. Exercises 3 times a week.  Stop the prednisone after the weekend.  Once weekly vitamin D 5000 IU weekly Turmeric 500mg  daily  Tart Cherry extract at night any dose.  We will see you again in 2-3 weeks.

## 2016-11-02 NOTE — Progress Notes (Signed)
[name] [address] [place], [place] [address] Phone: [phone] Subjective:    I'm seeing this patient by the request  of:  Penni Homans, MD   CC: Low back pain  [id number]  [name] is a 51 y.o. female coming in with complaint of Low back pain. Patient has had significant different medical problems recently she including a cellulitis that unfortunately led to the amputation of the left large toe. Patient states that she has had pain now going on nearly a month. Patient has been seen by an outside facility had been diagnosed with left-sided herniated disc at L4-L5 with moderate spinal stenosis. This was independently visualized by me on an MRI 05/26/2016.   Update 2/26 Patient was seen previously and was to start increasing gabapentin 300 mg. Patient was put on once weekly vitamin D and do home exercises. Patient states No significant improvement at this time. If anything seems to be worsening. More of a chronic numbness as well as some mild weakness in the left leg at this time. States it is waking her up at night. Feels the gabapentin is not helping.    Past Medical History:  Diagnosis Date  . Acute bronchitis 07/08/2011  . COMMON MIGRAINE 10/07/2010  . Depression 12/18/2012  . Diabetes mellitus type II, uncontrolled ([place]) 10/07/2010   Qualifier: Diagnosis of  By: Charlett Blake MD, [name]    . Diabetic foot infection ([place]) 08/26/2016  . Disturbance of skin sensation 10/07/2010  . HEART MURMUR, HX OF 10/07/2010  . Hyperlipidemia 12/06/2010  . Hypertension   . Lipoma of abdominal wall 10/05/2016  . NEPHROLITHIASIS, HX OF 10/07/2010  . Overweight(278.02) 12/06/2010  . PERIPHERAL NEUROPATHY, FEET 10/07/2010  . PVD (peripheral vascular disease) ([place]) 01/21/2012  . RESTLESS LEG SYNDROME 10/25/2010  . SOB (shortness of breath) 04/01/2012  . Stroke [facility]) 2014, 2017   most recently in 2/17 - intracerebral hemorrhage  . Weakness of right side of body  04/19/2013   Past Surgical History:  Procedure Laterality Date  . AMPUTATION TOE Left 09/09/2016   Procedure: AMPUTATION OF LEFT GREAT TOE;  Surgeon: Milly Jakob, MD;  Location: [place];  Service: Orthopedics;  Laterality: Left;  . WISDOM TOOTH EXTRACTION     Social History   Social History  . Marital status: Single    Spouse name: N/A  . Number of children: N/A  . Years of education: N/A   Occupational History  . machine operator    Social History Main Topics  . Smoking status: Former Smoker    Packs/day: 0.50    Quit date: 09/08/2005  . Smokeless tobacco: Never Used  . Alcohol use No  . Drug use: No  . Sexual activity: Yes    Partners: Male    Birth control/ protection: None   Other Topics Concern  . None   Social History Narrative   Lives with husband in a two story home.  Has one child.     Works as a Glass blower/designer.     Education: high school.   Allergies  Allergen Reactions  . Lyrica [Pregabalin] Other (See Comments)    MADE PATIENT VERY EMOTIONAL AND WOULD CRY EASILY  . Tramadol Nausea And Vomiting    Pt cant tolerate this pain med.   . Morphine And Related Nausea And Vomiting   Family History  Problem Relation Age of Onset  . Arthritis Mother   . Stroke Brother     previous smoker  .  Alcohol abuse Brother     in remission  . Leukemia Brother   . Diabetes Paternal Grandmother   . Healthy Son     Past medical history, social, surgical and family history all reviewed in electronic medical record.  No pertanent information unless stated regarding to the chief complaint.   Review of Systems: No headache, visual changes, nausea, vomiting, diarrhea, constipation, dizziness, abdominal pain, skin rash, fevers, chills, night sweats, weight loss, swollen lymph nodeschest pain, shortness of breath, mood changes. Positive for muscle aches, body aches, weakness in the lower extremity  Objective  Blood pressure (!) 160/90, pulse 88, height 5'  2" (1.575 m), weight 152 lb (68.9 kg), last menstrual period 02/12/2011.  Systems examined below as of 11/03/16 General: NAD A&O x3 mood, affect normal  HEENT: Pupils equal, extraocular movements intact no nystagmus Respiratory: not short of breath at rest or with speaking Cardiovascular: No lower extremity edema, non tender Skin: No rash noted. Hemosiderin deposits noted of the legs bilaterally. Abdomen: Soft nontender, no masses Neuro: Cranial nerves  intact, neurovascularly intact in all extremities with 2+ DTRs and 2+ pulses. Lymph: No lymphadenopathy appreciated today  Gait mild antalgic gait MSK: Non tender with full range of motion and good stability and symmetric strength and tone of shoulders, elbows, wrist,  knee hips and ankles bilaterally.   Back Exam:  Inspection: Unremarkable  Motion: Flexion 30 deg, Extension 15 deg, Side Bending to 30 deg bilaterally,  Rotation to 30 deg bilaterally  SLR laying: Positive left XSLR laying: Negative  Palpable tenderness: Continued tenderness mostly over the paraspinal musculature but now left greater than right. FABER: Significant tightness of the left side. Possibly worsen previous exam Sensory change: Gross sensation intact to all lumbar and sacral dermatomes.  Reflexes are 1+ on the left and 2+ on the right at the Achilles Strength at foot  4 out of 5 strength with mild clonus noted of the left foot of 2 beats. Still noted.       Impression and Recommendations:     This case required medical decision making of moderate complexity.      Note: This dictation was prepared with Dragon dictation along with smaller phrase technology. Any transcriptional errors that result from this process are unintentional.

## 2016-11-03 ENCOUNTER — Encounter: Payer: Self-pay | Admitting: Family Medicine

## 2016-11-03 ENCOUNTER — Other Ambulatory Visit: Payer: Self-pay

## 2016-11-03 ENCOUNTER — Ambulatory Visit (INDEPENDENT_AMBULATORY_CARE_PROVIDER_SITE_OTHER): Payer: BLUE CROSS/BLUE SHIELD | Admitting: Family Medicine

## 2016-11-03 ENCOUNTER — Ambulatory Visit (INDEPENDENT_AMBULATORY_CARE_PROVIDER_SITE_OTHER): Payer: BLUE CROSS/BLUE SHIELD | Admitting: Orthopedic Surgery

## 2016-11-03 VITALS — BP 160/90 | HR 88 | Ht 62.0 in | Wt 152.0 lb

## 2016-11-03 VITALS — Ht 62.0 in | Wt 152.0 lb

## 2016-11-03 DIAGNOSIS — G8929 Other chronic pain: Secondary | ICD-10-CM

## 2016-11-03 DIAGNOSIS — I739 Peripheral vascular disease, unspecified: Secondary | ICD-10-CM | POA: Diagnosis not present

## 2016-11-03 DIAGNOSIS — M48062 Spinal stenosis, lumbar region with neurogenic claudication: Secondary | ICD-10-CM

## 2016-11-03 DIAGNOSIS — M5442 Lumbago with sciatica, left side: Secondary | ICD-10-CM | POA: Diagnosis not present

## 2016-11-03 NOTE — Assessment & Plan Note (Signed)
Significant hemosiderin deposits bilaterally. Patient does have an ABI of the right leg showing some vascular compromise. Patient never followed up from inpatient assessment. Patient is having radicular symptoms in the leg and likely secondary to the spinal stenosis but I do think that further evaluation by vascular is a good idea. Referral placed today.

## 2016-11-03 NOTE — Progress Notes (Signed)
Office Visit Note   Patient: [name]           Date of Birth: Oct 23, 1965           MRN: [id number] Visit Date: 11/03/2016              Requested by: Mosie Lukes, MD [address] [place],  [address] PCP: Penni Homans, MD  Chief Complaint  Patient presents with  . Lower Back - Pain    HPI: Pt states tht she is having continued LBP with right sided radicular pain and numbness. She states that the Prednisone 10 mg is not helpful. Patient also was seen by internal medicine and they recommended Vit D 50,000 units once a week, tumeric and tart cherry. She states that this is not helpful and that "something needs to be done" that she is unable to work with this pain and " I just want to go back to work" Pamella Pert, RMA  Patient is a 51 year old woman seen in follow up for low back pain with left sided radicular symptoms. History of spondylosis L4-L5. Did take a Pred taper several weeks ago. This was moderately helpful. Has been seen by her PCP in interim for same. Was started on Vit D, Tumeric and tart cherry. Did also see Dr. Ernestina Patches for this. States at the time of her appointment was "not having enough pain to proceed with the shot." Has had return of pain and radiculopathy. States would like to proceed with injection at this time.      Assessment & Plan: Visit Diagnoses:  1. Chronic left-sided low back pain with left-sided sciatica     Plan: Have placed a referral back to Dr. Ernestina Patches for ESI. Can see her back as needed.   Follow-Up Instructions: Return if symptoms worsen or fail to improve, for p esi.   Physical Exam  Constitutional: Appears well-developed.  Head: Normocephalic.  Eyes: EOM are normal.  Neck: Normal range of motion.  Cardiovascular: Normal rate.   Pulmonary/Chest: Effort normal.  Neurological: Is alert.  Skin: Skin is warm.  Psychiatric: Has a normal mood and affect.  Back Exam   Tenderness  The patient is experiencing no  tenderness.   Range of Motion  The patient has normal back ROM.  Tests  Straight leg raise right: negative Straight leg raise left: positive  Other  Gait: normal        Imaging: No results found.  Orders:  No orders of the defined types were placed in this encounter.  No orders of the defined types were placed in this encounter.    Procedures: No procedures performed  Clinical Data: No additional findings.  Subjective: Review of Systems  Constitutional: Negative for chills and fever.    Objective: Vital Signs: Ht 5\' 2"  (1.575 m)   Wt 152 lb (68.9 kg)   LMP 02/12/2011 (Exact Date)   BMI 27.80 kg/m   Specialty Comments:  No specialty comments available.  PMFS History: Patient Active Problem List   Diagnosis Date Noted  . Spinal stenosis, lumbar region with neurogenic claudication 10/22/2016  . Other spondylosis with radiculopathy, lumbar region 10/13/2016  . Lipoma of abdominal wall 10/05/2016  . Acute osteomyelitis of toe, left ([place]) 09/05/2016  . Oral thrush 09/04/2016  . Moderate malnutrition ([place]) 09/04/2016  . Anemia of chronic disease 09/04/2016  . Cellulitis 09/03/2016  . Diabetic foot infection ([place]) 08/26/2016  . Diabetic polyneuropathy associated with diabetes  mellitus due to underlying condition ([place]) 05/07/2016  . Orthostatic hypotension 05/07/2016  . Cerebrovascular disease 04/27/2016  . Nausea with vomiting 04/01/2016  . ICH (intracerebral hemorrhage) ([place]) 03/30/2016  . Nonhealing nonsurgical wound 03/06/2016  . Visit for preventive health examination 05/21/2015  . Breast cancer screening 05/21/2015  . Arm lesion 07/02/2014  . Pain in joint, ankle and foot 04/28/2014  . Pain in limb 01/13/2014  . Neuropathic pain of both legs 01/13/2014  . Hip pain 01/12/2014  . Allergic rhinitis 03/10/2013  . Back pain 03/08/2013  . Depression 12/18/2012  . PVD (peripheral vascular disease) ([place]) 01/21/2012  . Diabetic ulcer of lower extremity  ([place]) 12/22/2011  . Hyperlipidemia 12/06/2010  . Overweight(278.02) 12/06/2010  . RESTLESS LEG SYNDROME 10/25/2010  . Migraine without aura 10/07/2010  . Hereditary and idiopathic peripheral neuropathy 10/07/2010  . Essential hypertension, benign 10/07/2010  . DISTURBANCE OF SKIN SENSATION 10/07/2010  . HEART MURMUR, HX OF 10/07/2010  . NEPHROLITHIASIS, HX OF 10/07/2010   Past Medical History:  Diagnosis Date  . Acute bronchitis 07/08/2011  . COMMON MIGRAINE 10/07/2010  . Depression 12/18/2012  . Diabetes mellitus type II, uncontrolled ([place]) 10/07/2010   Qualifier: Diagnosis of  By: Charlett Blake MD, [name]    . Diabetic foot infection ([place]) 08/26/2016  . Disturbance of skin sensation 10/07/2010  . HEART MURMUR, HX OF 10/07/2010  . Hyperlipidemia 12/06/2010  . Hypertension   . Lipoma of abdominal wall 10/05/2016  . NEPHROLITHIASIS, HX OF 10/07/2010  . Overweight(278.02) 12/06/2010  . PERIPHERAL NEUROPATHY, FEET 10/07/2010  . PVD (peripheral vascular disease) ([place]) 01/21/2012  . RESTLESS LEG SYNDROME 10/25/2010  . SOB (shortness of breath) 04/01/2012  . Stroke [facility]) 2014, 2017   most recently in 2/17 - intracerebral hemorrhage  . Weakness of right side of body 04/19/2013    Family History  Problem Relation Age of Onset  . Arthritis Mother   . Stroke Brother     previous smoker  . Alcohol abuse Brother     in remission  . Leukemia Brother   . Diabetes Paternal Grandmother   . Healthy Son     Past Surgical History:  Procedure Laterality Date  . AMPUTATION TOE Left 09/09/2016   Procedure: AMPUTATION OF LEFT GREAT TOE;  Surgeon: Milly Jakob, MD;  Location: [place];  Service: Orthopedics;  Laterality: Left;  . WISDOM TOOTH EXTRACTION     Social History   Occupational History  . machine operator    Social History Main Topics  . Smoking status: Former Smoker    Packs/day: 0.50    Quit date: 09/08/2005  . Smokeless tobacco: Never Used  . Alcohol use No  . Drug use: No    . Sexual activity: Yes    Partners: Male    Birth control/ protection: None

## 2016-11-03 NOTE — Patient Instructions (Signed)
good to see you  [name] is your friend.  Stop the gabapentin  We will get Epidural and they will call you  ABI for your leg to check blood flow as well.  See me again 2 weeks AFTER the epidural.

## 2016-11-03 NOTE — Assessment & Plan Note (Signed)
Worsening symptoms. Discussed with patient again at great length. I do feel that this is likely contributing to most of her lower extremity pain. We discussed which activities doing which ones to avoid. Patient will try to stay active. Epidural ordered today for further evaluation and will monitor blood glucose levels closely after the injection. Feeling to see her again in 2 weeks afterwards to make sure patient is doing relatively well. Patient discontinued the gabapentin with no significant improvement. Differential does include peripheral vascular disease and has been referred to vascular. Patient depending on results we'll discuss further management.

## 2016-11-05 ENCOUNTER — Institutional Professional Consult (permissible substitution) (INDEPENDENT_AMBULATORY_CARE_PROVIDER_SITE_OTHER): Payer: BLUE CROSS/BLUE SHIELD | Admitting: Physical Medicine and Rehabilitation

## 2016-11-05 NOTE — Progress Notes (Deleted)
[name] [place], [place] [address] Phone: [phone] Subjective:    I'm seeing this patient by the request  of:  Penni Homans, MD   CC: Low back pain  [id number]  [name] is a 51 y.o. female coming in with complaint of Low back pain. Patient has had significant different medical problems recently she including a cellulitis that unfortunately led to the amputation of the left large toe. Patient states that she has had pain now going on nearly a month. Patient has been seen by an outside facility had been diagnosed with left-sided herniated disc at L4-L5 with moderate spinal stenosis. This was independently visualized by me on an MRI 05/26/2016.   Update 2/26 Patient was seen previously and was to start increasing gabapentin 300 mg. Patient was put on once weekly vitamin D and do home exercises. Patient states No significant improvement at this time. If anything seems to be worsening. More of a chronic numbness as well as some mild weakness in the left leg at this time. States it is waking her up at night. Feels the gabapentin is not helping.    Past Medical History:  Diagnosis Date  . Acute bronchitis 07/08/2011  . COMMON MIGRAINE 10/07/2010  . Depression 12/18/2012  . Diabetes mellitus type II, uncontrolled ([facility]) 10/07/2010   Qualifier: Diagnosis of  By: Charlett Blake MD, [name]    . Diabetic foot infection ([place]) 08/26/2016  . Disturbance of skin sensation 10/07/2010  . HEART MURMUR, HX OF 10/07/2010  . Hyperlipidemia 12/06/2010  . Hypertension   . Lipoma of abdominal wall 10/05/2016  . NEPHROLITHIASIS, HX OF 10/07/2010  . Overweight(278.02) 12/06/2010  . PERIPHERAL NEUROPATHY, FEET 10/07/2010  . PVD (peripheral vascular disease) ([facility]) 01/21/2012  . RESTLESS LEG SYNDROME 10/25/2010  . SOB (shortness of breath) 04/01/2012  . Stroke [facility]) 2014, 2017   most recently in 2/17 - intracerebral hemorrhage  . Weakness of right side of body  04/19/2013   Past Surgical History:  Procedure Laterality Date  . AMPUTATION TOE Left 09/09/2016   Procedure: AMPUTATION OF LEFT GREAT TOE;  Surgeon: Milly Jakob, MD;  Location: [facility];  Service: Orthopedics;  Laterality: Left;  . WISDOM TOOTH EXTRACTION     Social History   Social History  . Marital status: Single    Spouse name: N/A  . Number of children: N/A  . Years of education: N/A   Occupational History  . machine operator    Social History Main Topics  . Smoking status: Former Smoker    Packs/day: 0.50    Quit date: 09/08/2005  . Smokeless tobacco: Never Used  . Alcohol use No  . Drug use: No  . Sexual activity: Yes    Partners: Male    Birth control/ protection: None   Other Topics Concern  . Not on file   Social History Narrative   Lives with husband in a two story home.  Has one child.     Works as a Glass blower/designer.     Education: high school.   Allergies  Allergen Reactions  . Lyrica [Pregabalin] Other (See Comments)    MADE PATIENT VERY EMOTIONAL AND WOULD CRY EASILY  . Tramadol Nausea And Vomiting    Pt cant tolerate this pain med.   . Morphine And Related Nausea And Vomiting   Family History  Problem Relation Age of Onset  . Arthritis Mother   . Stroke Brother  previous smoker  . Alcohol abuse Brother     in remission  . Leukemia Brother   . Diabetes Paternal Grandmother   . Healthy Son     Past medical history, social, surgical and family history all reviewed in electronic medical record.  No pertanent information unless stated regarding to the chief complaint.   Review of Systems: No headache, visual changes, nausea, vomiting, diarrhea, constipation, dizziness, abdominal pain, skin rash, fevers, chills, night sweats, weight loss, swollen lymph nodeschest pain, shortness of breath, mood changes. Positive for muscle aches, body aches, weakness in the lower extremity  Objective  Last menstrual period 02/12/2011.    Systems examined below as of 11/05/16 General: NAD A&O x3 mood, affect normal  HEENT: Pupils equal, extraocular movements intact no nystagmus Respiratory: not short of breath at rest or with speaking Cardiovascular: No lower extremity edema, non tender Skin: No rash noted. Hemosiderin deposits noted of the legs bilaterally. Abdomen: Soft nontender, no masses Neuro: Cranial nerves  intact, neurovascularly intact in all extremities with 2+ DTRs and 2+ pulses. Lymph: No lymphadenopathy appreciated today  Gait mild antalgic gait MSK: Non tender with full range of motion and good stability and symmetric strength and tone of shoulders, elbows, wrist,  knee hips and ankles bilaterally.   Back Exam:  Inspection: Unremarkable  Motion: Flexion 30 deg, Extension 15 deg, Side Bending to 30 deg bilaterally,  Rotation to 30 deg bilaterally  SLR laying: Positive left XSLR laying: Negative  Palpable tenderness: Continued tenderness mostly over the paraspinal musculature but now left greater than right. FABER: Significant tightness of the left side. Possibly worsen previous exam Sensory change: Gross sensation intact to all lumbar and sacral dermatomes.  Reflexes are 1+ on the left and 2+ on the right at the Achilles Strength at foot  4 out of 5 strength with mild clonus noted of the left foot of 2 beats. Still noted.       Impression and Recommendations:     This case required medical decision making of moderate complexity.      Note: This dictation was prepared with Dragon dictation along with smaller phrase technology. Any transcriptional errors that result from this process are unintentional.

## 2016-11-06 ENCOUNTER — Ambulatory Visit: Payer: BLUE CROSS/BLUE SHIELD | Admitting: Family Medicine

## 2016-11-10 ENCOUNTER — Ambulatory Visit (INDEPENDENT_AMBULATORY_CARE_PROVIDER_SITE_OTHER): Payer: BLUE CROSS/BLUE SHIELD | Admitting: Family Medicine

## 2016-11-10 ENCOUNTER — Encounter: Payer: Self-pay | Admitting: Family Medicine

## 2016-11-10 VITALS — BP 150/70 | HR 84 | Temp 97.8°F | Wt 159.8 lb

## 2016-11-10 DIAGNOSIS — H547 Unspecified visual loss: Secondary | ICD-10-CM

## 2016-11-10 DIAGNOSIS — M48062 Spinal stenosis, lumbar region with neurogenic claudication: Secondary | ICD-10-CM

## 2016-11-10 DIAGNOSIS — H6690 Otitis media, unspecified, unspecified ear: Secondary | ICD-10-CM | POA: Insufficient documentation

## 2016-11-10 DIAGNOSIS — E1169 Type 2 diabetes mellitus with other specified complication: Secondary | ICD-10-CM | POA: Diagnosis not present

## 2016-11-10 DIAGNOSIS — L089 Local infection of the skin and subcutaneous tissue, unspecified: Secondary | ICD-10-CM

## 2016-11-10 DIAGNOSIS — E782 Mixed hyperlipidemia: Secondary | ICD-10-CM | POA: Diagnosis not present

## 2016-11-10 DIAGNOSIS — S81802A Unspecified open wound, left lower leg, initial encounter: Secondary | ICD-10-CM | POA: Diagnosis not present

## 2016-11-10 DIAGNOSIS — E118 Type 2 diabetes mellitus with unspecified complications: Secondary | ICD-10-CM

## 2016-11-10 DIAGNOSIS — E11628 Type 2 diabetes mellitus with other skin complications: Secondary | ICD-10-CM

## 2016-11-10 HISTORY — DX: Unspecified visual loss: H54.7

## 2016-11-10 MED ORDER — ESCITALOPRAM OXALATE 10 MG PO TABS: 10.0000 mg | ORAL_TABLET | Freq: Every day | ORAL | 2 refills | Status: DC

## 2016-11-10 MED ORDER — LISINOPRIL 20 MG PO TABS: 20.0000 mg | ORAL_TABLET | Freq: Two times a day (BID) | ORAL | 2 refills | Status: DC

## 2016-11-10 MED ORDER — CEPHALEXIN 500 MG PO CAPS: 500.0000 mg | ORAL_CAPSULE | Freq: Three times a day (TID) | ORAL | 0 refills | Status: DC

## 2016-11-10 NOTE — Patient Instructions (Signed)
DASH Eating Plan DASH stands for "Dietary Approaches to Stop Hypertension." The DASH eating plan is a healthy eating plan that has been shown to reduce high blood pressure (hypertension). It may also reduce your risk for type 2 diabetes, heart disease, and stroke. The DASH eating plan may also help with weight loss. What are tips for following this plan? General guidelines  Avoid eating more than 2,300 mg (milligrams) of salt (sodium) a day. If you have hypertension, you may need to reduce your sodium intake to 1,500 mg a day.  Limit alcohol intake to no more than 1 drink a day for nonpregnant women and 2 drinks a day for men. One drink equals 12 oz of beer, 5 oz of wine, or 1 oz of hard liquor.  Work with your health care provider to maintain a healthy body weight or to lose weight. Ask what an ideal weight is for you.  Get at least 30 minutes of exercise that causes your heart to beat faster (aerobic exercise) most days of the week. Activities may include walking, swimming, or biking.  Work with your health care provider or diet and nutrition specialist (dietitian) to adjust your eating plan to your individual calorie needs. Reading food labels  Check food labels for the amount of sodium per serving. Choose foods with less than 5 percent of the Daily Value of sodium. Generally, foods with less than 300 mg of sodium per serving fit into this eating plan.  To find whole grains, look for the word "whole" as the first word in the ingredient list. Shopping  Buy products labeled as "low-sodium" or "no salt added."  Buy fresh foods. Avoid canned foods and premade or frozen meals. Cooking  Avoid adding salt when cooking. Use salt-free seasonings or herbs instead of table salt or sea salt. Check with your health care provider or pharmacist before using salt substitutes.  Do not fry foods. Cook foods using healthy methods such as baking, boiling, grilling, and broiling instead.  Cook with  heart-healthy oils, such as olive, canola, soybean, or sunflower oil. Meal planning   Eat a balanced diet that includes: ? 5 or more servings of fruits and vegetables each day. At each meal, try to fill half of your plate with fruits and vegetables. ? Up to 6-8 servings of whole grains each day. ? Less than 6 oz of lean meat, poultry, or fish each day. A 3-oz serving of meat is about the same size as a deck of cards. One egg equals 1 oz. ? 2 servings of low-fat dairy each day. ? A serving of nuts, seeds, or beans 5 times each week. ? Heart-healthy fats. Healthy fats called Omega-3 fatty acids are found in foods such as flaxseeds and coldwater fish, like sardines, salmon, and mackerel.  Limit how much you eat of the following: ? Canned or prepackaged foods. ? Food that is high in trans fat, such as fried foods. ? Food that is high in saturated fat, such as fatty meat. ? Sweets, desserts, sugary drinks, and other foods with added sugar. ? Full-fat dairy products.  Do not salt foods before eating.  Try to eat at least 2 vegetarian meals each week.  Eat more home-cooked food and less restaurant, buffet, and fast food.  When eating at a restaurant, ask that your food be prepared with less salt or no salt, if possible. What foods are recommended? The items listed may not be a complete list. Talk with your dietitian about what   dietary choices are best for you. Grains Whole-grain or whole-wheat bread. Whole-grain or whole-wheat pasta. Brown rice. Oatmeal. Quinoa. Bulgur. Whole-grain and low-sodium cereals. Pita bread. Low-fat, low-sodium crackers. Whole-wheat flour tortillas. Vegetables Fresh or frozen vegetables (raw, steamed, roasted, or grilled). Low-sodium or reduced-sodium tomato and vegetable juice. Low-sodium or reduced-sodium tomato sauce and tomato paste. Low-sodium or reduced-sodium canned vegetables. Fruits All fresh, dried, or frozen fruit. Canned fruit in natural juice (without  added sugar). Meat and other protein foods Skinless chicken or turkey. Ground chicken or turkey. Pork with fat trimmed off. Fish and seafood. Egg whites. Dried beans, peas, or lentils. Unsalted nuts, nut butters, and seeds. Unsalted canned beans. Lean cuts of beef with fat trimmed off. Low-sodium, lean deli meat. Dairy Low-fat (1%) or fat-free (skim) milk. Fat-free, low-fat, or reduced-fat cheeses. Nonfat, low-sodium ricotta or cottage cheese. Low-fat or nonfat yogurt. Low-fat, low-sodium cheese. Fats and oils Soft margarine without trans fats. Vegetable oil. Low-fat, reduced-fat, or light mayonnaise and salad dressings (reduced-sodium). Canola, safflower, olive, soybean, and sunflower oils. Avocado. Seasoning and other foods Herbs. Spices. Seasoning mixes without salt. Unsalted popcorn and pretzels. Fat-free sweets. What foods are not recommended? The items listed may not be a complete list. Talk with your dietitian about what dietary choices are best for you. Grains Baked goods made with fat, such as croissants, muffins, or some breads. Dry pasta or rice meal packs. Vegetables Creamed or fried vegetables. Vegetables in a cheese sauce. Regular canned vegetables (not low-sodium or reduced-sodium). Regular canned tomato sauce and paste (not low-sodium or reduced-sodium). Regular tomato and vegetable juice (not low-sodium or reduced-sodium). Pickles. Olives. Fruits Canned fruit in a light or heavy syrup. Fried fruit. Fruit in cream or butter sauce. Meat and other protein foods Fatty cuts of meat. Ribs. Fried meat. Bacon. Sausage. Bologna and other processed lunch meats. Salami. Fatback. Hotdogs. Bratwurst. Salted nuts and seeds. Canned beans with added salt. Canned or smoked fish. Whole eggs or egg yolks. Chicken or turkey with skin. Dairy Whole or 2% milk, cream, and half-and-half. Whole or full-fat cream cheese. Whole-fat or sweetened yogurt. Full-fat cheese. Nondairy creamers. Whipped toppings.  Processed cheese and cheese spreads. Fats and oils Butter. Stick margarine. Lard. Shortening. Ghee. Bacon fat. Tropical oils, such as coconut, palm kernel, or palm oil. Seasoning and other foods Salted popcorn and pretzels. Onion salt, garlic salt, seasoned salt, table salt, and sea salt. Worcestershire sauce. Tartar sauce. Barbecue sauce. Teriyaki sauce. Soy sauce, including reduced-sodium. Steak sauce. Canned and packaged gravies. Fish sauce. Oyster sauce. Cocktail sauce. Horseradish that you find on the shelf. Ketchup. Mustard. Meat flavorings and tenderizers. Bouillon cubes. Hot sauce and Tabasco sauce. Premade or packaged marinades. Premade or packaged taco seasonings. Relishes. Regular salad dressings. Where to find more information:  National Heart, Lung, and Blood Institute: www.nhlbi.nih.gov  American Heart Association: www.heart.org Summary  The DASH eating plan is a healthy eating plan that has been shown to reduce high blood pressure (hypertension). It may also reduce your risk for type 2 diabetes, heart disease, and stroke.  With the DASH eating plan, you should limit salt (sodium) intake to 2,300 mg a day. If you have hypertension, you may need to reduce your sodium intake to 1,500 mg a day.  When on the DASH eating plan, aim to eat more fresh fruits and vegetables, whole grains, lean proteins, low-fat dairy, and heart-healthy fats.  Work with your health care provider or diet and nutrition specialist (dietitian) to adjust your eating plan to your individual   calorie needs. This information is not intended to replace advice given to you by your health care provider. Make sure you discuss any questions you have with your health care provider. Document Released: 08/14/2011 Document Revised: 08/18/2016 Document Reviewed: 08/18/2016 Elsevier Interactive Patient Education  2017 [employer]  

## 2016-11-10 NOTE — Assessment & Plan Note (Signed)
Agrees to start Lexapro 10 mg daily

## 2016-11-10 NOTE — Assessment & Plan Note (Signed)
Left eye with cataract and retinopathy, in need of an opthamologist, referral placed

## 2016-11-10 NOTE — Assessment & Plan Note (Addendum)
Left great toe amputated in January. She is noting it is healing but she has daily pain in her stump. She has follow up with orthopaedics, she is very frustrated but acknowledges she is healing well.

## 2016-11-10 NOTE — Assessment & Plan Note (Signed)
On right with cerumen impaction also noted b/l, H2O2 to both ears after surgery. Also Keflex 500 mg po tid

## 2016-11-10 NOTE — Assessment & Plan Note (Signed)
Scab mid calf laterally left leg, no surrounding fluctuance or edema. Start Keflex

## 2016-11-10 NOTE — Progress Notes (Signed)
Pre visit review using our clinic review tool, if applicable. No additional management support is needed unless otherwise documented below in the visit note. 

## 2016-11-10 NOTE — Assessment & Plan Note (Signed)
Poorly controlled will alter medications, encouraged DASH diet, minimize caffeine and obtain adequate sleep. Report concerning symptoms and follow up as directed and as needed. Increase Lisinopril to 20 mg po bid

## 2016-11-10 NOTE — Progress Notes (Signed)
[name], female   DOB: 1966-05-29, 51 y.o.   MRN: [id number]   Subjective:  I acted as a Education administrator for Penni Homans, [place], [place]   [name], female    DOB: Jan 20, 1966, 51 y.o.   MRN: [id number]  Chief Complaint  Patient presents with  . Follow-up    Medication F/U.  Marland Kitchen Balance  . Trouble Focusing    HPI  Patient is in today for follow up on numerous concerns including diabetes, peripheral neuropathy, depression and hyperlipidemia. She is struggling with decreased mood and anhedonia, she is frustrated about not being able to return to work due to her recent amputation and pain. Her stump is healing well but is notably painful. She notes increased pain in her foot since her surgery. She is noting pressure and some pain in b/l ears, also endorses poor balance and a sore on her left lower extremity. No fevers or chills but she is struggling with fatigue. Denies CP/palp/SOB/HA/congestion/fevers/GI or GU c/o. Taking meds as prescribed  Patient Care Team: Mosie Lukes, MD as PCP - General (Family Medicine) Alda Berthold, DO as Consulting Physician (Neurology) Pieter Partridge, DO as Consulting Physician (Neurology) Josue Hector, MD as Consulting Physician (Cardiology) Ricard Dillon, MD as Consulting Physician (Internal Medicine)   Past Medical History:  Diagnosis Date  . Acute bronchitis 07/08/2011  . COMMON MIGRAINE 10/07/2010  . Decreased visual acuity 11/10/2016  . Depression 12/18/2012  . Diabetes mellitus type II, uncontrolled ([place]) 10/07/2010   Qualifier: Diagnosis of  By: Charlett Blake MD, [name]    . Diabetic foot infection ([place]) 08/26/2016  . Disturbance of skin sensation 10/07/2010  . HEART MURMUR, HX OF 10/07/2010  . Hyperlipidemia 12/06/2010  . Hypertension   . Lipoma of abdominal wall 10/05/2016  . NEPHROLITHIASIS, HX OF 10/07/2010  . Otitis media 11/10/2016  . Overweight(278.02) 12/06/2010  . PERIPHERAL NEUROPATHY, FEET 10/07/2010  . PVD  (peripheral vascular disease) ([place]) 01/21/2012  . RESTLESS LEG SYNDROME 10/25/2010  . SOB (shortness of breath) 04/01/2012  . Stroke [facility]) 2014, 2017   most recently in 2/17 - intracerebral hemorrhage  . Weakness of right side of body 04/19/2013    Past Surgical History:  Procedure Laterality Date  . AMPUTATION TOE Left 09/09/2016   Procedure: AMPUTATION OF LEFT GREAT TOE;  Surgeon: Milly Jakob, MD;  Location: [facility];  Service: Orthopedics;  Laterality: Left;  . WISDOM TOOTH EXTRACTION      Family History  Problem Relation Age of Onset  . Arthritis Mother   . Stroke Brother     previous smoker  . Alcohol abuse Brother     in remission  . Leukemia Brother   . Diabetes Paternal Grandmother   . Healthy Son     Social History   Social History  . Marital status: Single    Spouse name: N/A  . Number of children: N/A  . Years of education: N/A   Occupational History  . machine operator    Social History Main Topics  . Smoking status: Former Smoker    Packs/day: 0.50    Quit date: 09/08/2005  . Smokeless tobacco: Never Used  . Alcohol use No  . Drug use: No  . Sexual activity: Yes    Partners: Male    Birth control/ protection: None   Other Topics Concern  . Not on file   Social History Narrative   Lives with husband in a two  story home.  Has one child.     Works as a Glass blower/designer.     Education: high school.    Outpatient Medications Prior to Visit  Medication Sig Dispense Refill  . aspirin EC 81 MG tablet Take 81 mg by mouth daily.    Marland Kitchen glucose blood (ONE TOUCH ULTRA TEST) test strip Use once daily to check blood sugar.  DX E11.9 100 each 11  . HYDROcodone-acetaminophen (NORCO/VICODIN) 5-325 MG tablet Take 1 tablet by mouth as needed.    . insulin glargine (LANTUS) 100 UNIT/ML injection Inject 0.45 mLs (45 Units total) into the skin 2 (two) times daily. 10 mL 11  . Insulin Pen Needle 32G X 4 MM MISC Use as directed for insulin.DX E11.65 100  each 6  . Lancets (FREESTYLE) lancets DX- 250.02  Pts machine is Freestyle Freedom Lite 100 each 5  . LANTUS SOLOSTAR 100 UNIT/ML Solostar Pen INJECT 34 UNITS IN AM & 32 UNITS IN PM. INCREASE BY 2 UNITS EVERY 3 DAYS AS LONG AS ABOVE 100  4  . metFORMIN (GLUCOPHAGE) 1000 MG tablet TAKE 1 TABLET TWICE DAILY AND 1/2 TABLET AT NOON 75 tablet 1  . predniSONE (DELTASONE) 10 MG tablet Take 1 tablet (10 mg total) by mouth daily with breakfast. 30 tablet 1  . simvastatin (ZOCOR) 10 MG tablet Take 1 tablet (10 mg total) by mouth daily. 30 tablet 3  . traMADol (ULTRAM) 50 MG tablet Take 1 tablet (50 mg total) by mouth every 6 (six) hours as needed. 30 tablet 0  . Vitamin D, Ergocalciferol, (DRISDOL) 50000 units CAPS capsule Take 1 capsule (50,000 Units total) by mouth every 7 (seven) days. 12 capsule 0  . lisinopril (PRINIVIL,ZESTRIL) 20 MG tablet Take 1 tablet (20 mg total) by mouth daily. (Patient taking differently: Take 10 mg by mouth 2 (two) times daily. ) 30 tablet 0   No facility-administered medications prior to visit.     Allergies  Allergen Reactions  . Lyrica [Pregabalin] Other (See Comments)    MADE PATIENT VERY EMOTIONAL AND WOULD CRY EASILY  . Tramadol Nausea And Vomiting    Pt cant tolerate this pain med.   . Morphine And Related Nausea And Vomiting    Review of Systems  Constitutional: Negative for fever and malaise/fatigue.  HENT: Positive for ear pain. Negative for congestion.   Eyes: Negative for blurred vision.  Respiratory: Negative for cough and shortness of breath.   Cardiovascular: Negative for chest pain, palpitations and leg swelling.  Gastrointestinal: Negative for vomiting.  Musculoskeletal: Positive for back pain.  Skin: Negative for rash.  Neurological: Positive for tingling and sensory change. Negative for loss of consciousness and headaches.       Balance is off.  Psychiatric/Behavioral:       Trouble focusing.       Objective:    Physical Exam    Constitutional: She is oriented to person, place, and time. She appears well-developed and well-nourished. No distress.  HENT:  Head: Normocephalic and atraumatic.  Eyes: Conjunctivae are normal.  Neck: Normal range of motion. No thyromegaly present.  Cardiovascular: Normal rate and regular rhythm.   Murmur heard. Pulmonary/Chest: Effort normal and breath sounds normal. She has no wheezes.  Abdominal: Soft. Bowel sounds are normal. There is no tenderness.  Musculoskeletal: She exhibits deformity. She exhibits no edema.  Large great toe on left absent. Scab at stump.   Neurological: She is alert and oriented to person, place, and time.  Decreased sensation  b/l feet  Skin: Skin is warm and dry. She is not diaphoretic.  5 mm x 2 cm scab on left lower leg. No surrounding erythema or fluctuance.   Psychiatric: She has a normal mood and affect.    BP (!) 150/70 (BP Location: Left Arm, Patient Position: Sitting, Cuff Size: Large)   Pulse 84   Temp 97.8 F (36.6 C) (Oral)   Wt 159 lb 12.8 oz (72.5 kg)   LMP 02/12/2011 (Exact Date)   SpO2 97% Comment: RA  BMI 29.23 kg/m  Wt Readings from Last 3 Encounters:  11/10/16 159 lb 12.8 oz (72.5 kg)  11/03/16 152 lb (68.9 kg)  11/03/16 152 lb (68.9 kg)     Lab Results  Component Value Date   WBC 8.0 10/13/2016   HGB 12.1 10/13/2016   HCT 36.9 10/13/2016   PLT 393.0 10/13/2016   GLUCOSE 105 (H) 10/13/2016   CHOL 224 (H) 10/13/2016   TRIG 112.0 10/13/2016   HDL 51.50 10/13/2016   LDLDIRECT 136.4 04/01/2012   LDLCALC 150 (H) 10/13/2016   ALT 25 10/13/2016   AST 18 10/13/2016   NA 142 10/13/2016   K 4.1 10/13/2016   CL 105 10/13/2016   CREATININE 0.57 10/13/2016   BUN 18 10/13/2016   CO2 28 10/13/2016   TSH 2.82 10/13/2016   INR 1.07 03/30/2016   HGBA1C 8.8 (H) 10/13/2016   MICROALBUR 1.1 12/22/2011    Lab Results  Component Value Date   TSH 2.82 10/13/2016   Lab Results  Component Value Date   WBC 8.0 10/13/2016   HGB  12.1 10/13/2016   HCT 36.9 10/13/2016   MCV 87.4 10/13/2016   PLT 393.0 10/13/2016   Lab Results  Component Value Date   NA 142 10/13/2016   K 4.1 10/13/2016   CO2 28 10/13/2016   GLUCOSE 105 (H) 10/13/2016   BUN 18 10/13/2016   CREATININE 0.57 10/13/2016   BILITOT 0.4 10/13/2016   ALKPHOS 81 10/13/2016   AST 18 10/13/2016   ALT 25 10/13/2016   PROT 7.8 10/13/2016   ALBUMIN 4.0 10/13/2016   CALCIUM 9.8 10/13/2016   ANIONGAP 7 09/06/2016   GFR 118.00 10/13/2016   Lab Results  Component Value Date   CHOL 224 (H) 10/13/2016   Lab Results  Component Value Date   HDL 51.50 10/13/2016   Lab Results  Component Value Date   LDLCALC 150 (H) 10/13/2016   Lab Results  Component Value Date   TRIG 112.0 10/13/2016   Lab Results  Component Value Date   CHOLHDL 4 10/13/2016   Lab Results  Component Value Date   HGBA1C 8.8 (H) 10/13/2016       Assessment & Plan:   Problem List Items Addressed This Visit    Hyperlipidemia    Tolerating statin, encouraged heart healthy diet, avoid trans fats, minimize simple carbs and saturated fats. Increase exercise as tolerated      Relevant Medications   lisinopril (PRINIVIL,ZESTRIL) 20 MG tablet   Wound of left leg    Scab mid calf laterally left leg, no surrounding fluctuance or edema. Start Keflex      Diabetic foot infection ([place])    Left great toe amputated in January. She is noting it is healing but she has daily pain in her stump. She has follow up with orthopaedics, she is very frustrated but acknowledges she is healing well.       Relevant Medications   cephALEXin (KEFLEX) 500 MG capsule  lisinopril (PRINIVIL,ZESTRIL) 20 MG tablet   Spinal stenosis, lumbar region with neurogenic claudication    Has been following with sports med and has an injection scheduled soon to try and manage her pain.      Otitis media    On right with cerumen impaction also noted b/l, H2O2 to both ears after surgery. Also Keflex 500 mg po  tid      Relevant Medications   cephALEXin (KEFLEX) 500 MG capsule   Decreased visual acuity    Left eye with cataract and retinopathy, in need of an opthamologist, referral placed      Relevant Orders   Ambulatory referral to Ophthalmology    Other Visit Diagnoses    Type 2 diabetes mellitus with complication, unspecified long term insulin use status ([place])    -  Primary   Relevant Medications   lisinopril (PRINIVIL,ZESTRIL) 20 MG tablet   Other Relevant Orders   Ambulatory referral to Endocrinology      I have discontinued Ms. Gehring's lisinopril and escitalopram. I am also having her start on cephALEXin, escitalopram, and lisinopril. Additionally, I am having her maintain her freestyle, glucose blood, Insulin Pen Needle, metFORMIN, aspirin EC, insulin glargine, traMADol, HYDROcodone-acetaminophen, LANTUS SOLOSTAR, predniSONE, simvastatin, Vitamin D (Ergocalciferol), and gabapentin.  Meds ordered this encounter  Medications  . gabapentin (NEURONTIN) 100 MG capsule    Refill:  3  . DISCONTD: escitalopram (LEXAPRO) 10 MG tablet    Sig: Take 1 tablet (10 mg total) by mouth daily.    Dispense:  30 tablet    Refill:  2  . cephALEXin (KEFLEX) 500 MG capsule    Sig: Take 1 capsule (500 mg total) by mouth 3 (three) times daily.    Dispense:  30 capsule    Refill:  0  . escitalopram (LEXAPRO) 10 MG tablet    Sig: Take 1 tablet (10 mg total) by mouth daily.    Dispense:  30 tablet    Refill:  2  . lisinopril (PRINIVIL,ZESTRIL) 20 MG tablet    Sig: Take 1 tablet (20 mg total) by mouth 2 (two) times daily.    Dispense:  90 tablet    Refill:  2    CMA served as scribe during this visit. History, Physical and Plan performed by medical provider. Documentation and orders reviewed and attested to.  Penni Homans, MD

## 2016-11-10 NOTE — Assessment & Plan Note (Signed)
hgba1c unacceptable, minimize simple carbs. Increase exercise as tolerated. Continue current meds but refer to endocrinology for further management. Have tried to send her numerous times in past but due to work schedule she was unable to go. Now that she is out of work she agrees to referral.

## 2016-11-10 NOTE — Assessment & Plan Note (Signed)
Tolerating statin, encouraged heart healthy diet, avoid trans fats, minimize simple carbs and saturated fats. Increase exercise as tolerated 

## 2016-11-10 NOTE — Assessment & Plan Note (Signed)
Has been following with sports med and has an injection scheduled soon to try and manage her pain.

## 2016-11-12 ENCOUNTER — Ambulatory Visit
Admission: RE | Admit: 2016-11-12 | Discharge: 2016-11-12 | Disposition: A | Discharge status: Home / Self Care | Payer: BLUE CROSS/BLUE SHIELD | Source: Ambulatory Visit | Attending: Family Medicine | Admitting: Family Medicine

## 2016-11-12 DIAGNOSIS — M48062 Spinal stenosis, lumbar region with neurogenic claudication: Secondary | ICD-10-CM

## 2016-11-12 MED ORDER — IOPAMIDOL (ISOVUE-M 200) INJECTION 41%
1.0000 mL | Freq: Once | INTRAMUSCULAR | Status: AC
  Administered 2016-11-12: 1 mL via EPIDURAL

## 2016-11-12 MED ORDER — METHYLPREDNISOLONE ACETATE 40 MG/ML INJ SUSP (RADIOLOG
120.0000 mg | Freq: Once | INTRAMUSCULAR | Status: AC
  Administered 2016-11-12: 60 mg via EPIDURAL

## 2016-11-12 NOTE — Discharge Instructions (Signed)

## 2016-11-19 ENCOUNTER — Telehealth: Payer: Self-pay | Admitting: Medical

## 2016-11-19 ENCOUNTER — Ambulatory Visit (INDEPENDENT_AMBULATORY_CARE_PROVIDER_SITE_OTHER): Payer: BLUE CROSS/BLUE SHIELD | Admitting: Medical

## 2016-11-19 ENCOUNTER — Encounter: Payer: Self-pay | Admitting: Medical

## 2016-11-19 VITALS — BP 178/95 | HR 88 | Temp 98.3°F | Resp 16 | Ht 62.0 in | Wt 159.0 lb

## 2016-11-19 DIAGNOSIS — H6121 Impacted cerumen, right ear: Secondary | ICD-10-CM | POA: Diagnosis not present

## 2016-11-19 DIAGNOSIS — R0981 Nasal congestion: Secondary | ICD-10-CM

## 2016-11-19 DIAGNOSIS — N644 Mastodynia: Secondary | ICD-10-CM | POA: Diagnosis not present

## 2016-11-19 DIAGNOSIS — N63 Unspecified lump in unspecified breast: Secondary | ICD-10-CM

## 2016-11-19 MED ORDER — FLUTICASONE PROPIONATE 50 MCG/ACT NA SUSP: 2.0000 | Freq: Every day | NASAL | 1 refills | Status: DC

## 2016-11-19 MED ORDER — AZELASTINE HCL 0.1 % NA SOLN: 2.0000 | Freq: Two times a day (BID) | NASAL | 3 refills | Status: DC

## 2016-11-19 NOTE — Patient Instructions (Addendum)
For your nasal congestion and possible allergies will rx flonase nasal spray. Can add astelin to flonase if after one week symptoms persists.  Wax cleared by lavage today.  For your recent new breast pain with possible lump will get diagnostic mammogram. Would ask you go downstairs today and get that scheduled(Very important. You will also likely need to let them know where last mammogram was done.  Follow up in 2 weeks or as needed

## 2016-11-19 NOTE — Progress Notes (Signed)
Subjective:    [name], female    DOB: 1966/09/03, 51 y.o.   MRN: [id number]  HPI  Pt in with some sinus pressure and ear pressure for 2 weeks. Pt states not clearing up.  Pt was given keflex for ear infection. Pt states not getting. Rt ear is worse than left. Some faint sinus congestion in the morning. No fever, no chills or sweating. No sneezing, no itchy eyes but has been sneezing.  Lt breast has been sore for about 2 weeks. Pt is menopausal. She had early menopause 10 years. Pt last mammogram years ago and does not report any abnormal result.  Review of Systems  Constitutional: Negative for chills, fatigue and fever.  HENT: Positive for congestion and ear pain. Negative for postnasal drip, sinus pain, sinus pressure and sore throat.   Respiratory: Negative for cough, chest tightness, shortness of breath and wheezing.   Cardiovascular: Negative for chest pain and palpitations.  Gastrointestinal: Negative for abdominal pain.  Musculoskeletal: Negative for back pain.       Left breast faint discomfort.  Hematological: Negative for adenopathy. Does not bruise/bleed easily.  Psychiatric/Behavioral: Negative for behavioral problems and confusion.   Left breast pain- some tenderness around nipple.  Past Medical History:  Diagnosis Date  . Acute bronchitis 07/08/2011  . COMMON MIGRAINE 10/07/2010  . Decreased visual acuity 11/10/2016  . Depression 12/18/2012  . Diabetes mellitus type II, uncontrolled ([place]) 10/07/2010   Qualifier: Diagnosis of  By: Charlett Blake MD, [name]    . Diabetic foot infection ([place]) 08/26/2016  . Disturbance of skin sensation 10/07/2010  . HEART MURMUR, HX OF 10/07/2010  . Hyperlipidemia 12/06/2010  . Hypertension   . Lipoma of abdominal wall 10/05/2016  . NEPHROLITHIASIS, HX OF 10/07/2010  . Otitis media 11/10/2016  . Overweight(278.02) 12/06/2010  . PERIPHERAL NEUROPATHY, FEET 10/07/2010  . PVD (peripheral vascular disease) ([place]) 01/21/2012  . RESTLESS  LEG SYNDROME 10/25/2010  . SOB (shortness of breath) 04/01/2012  . Stroke [facility]) 2014, 2017   most recently in 2/17 - intracerebral hemorrhage  . Weakness of right side of body 04/19/2013     Social History   Social History  . Marital status: Single    Spouse name: N/A  . Number of children: N/A  . Years of education: N/A   Occupational History  . machine operator    Social History Main Topics  . Smoking status: Former Smoker    Packs/day: 0.50    Quit date: 09/08/2005  . Smokeless tobacco: Never Used  . Alcohol use No  . Drug use: No  . Sexual activity: Yes    Partners: Male    Birth control/ protection: None   Other Topics Concern  . Not on file   Social History Narrative   Lives with husband in a two story home.  Has one child.     Works as a Glass blower/designer.     Education: high school.    Past Surgical History:  Procedure Laterality Date  . AMPUTATION TOE Left 09/09/2016   Procedure: AMPUTATION OF LEFT GREAT TOE;  Surgeon: Milly Jakob, MD;  Location: [facility];  Service: Orthopedics;  Laterality: Left;  . WISDOM TOOTH EXTRACTION      Family History  Problem Relation Age of Onset  . Arthritis Mother   . Stroke Brother     previous smoker  . Alcohol abuse Brother     in remission  . Leukemia Brother   .  Diabetes Paternal Grandmother   . Healthy Son     Allergies  Allergen Reactions  . Lyrica [Pregabalin] Other (See Comments)    MADE PATIENT VERY EMOTIONAL AND WOULD CRY EASILY  . Morphine And Related Nausea And Vomiting  . Tramadol Nausea And Vomiting    Pt cant tolerate this pain med.     Current Outpatient Prescriptions on File Prior to Visit  Medication Sig Dispense Refill  . aspirin EC 81 MG tablet Take 81 mg by mouth daily.    Marland Kitchen escitalopram (LEXAPRO) 10 MG tablet Take 1 tablet (10 mg total) by mouth daily. 30 tablet 2  . gabapentin (NEURONTIN) 100 MG capsule Take 200 mg by mouth daily.   3  . glucose blood (ONE TOUCH ULTRA  TEST) test strip Use once daily to check blood sugar.  DX E11.9 100 each 11  . HYDROcodone-acetaminophen (NORCO/VICODIN) 5-325 MG tablet Take 1 tablet by mouth as needed.    . Insulin Pen Needle 32G X 4 MM MISC Use as directed for insulin.DX E11.65 100 each 6  . Lancets (FREESTYLE) lancets DX- 250.02  Pts machine is Freestyle Freedom Lite 100 each 5  . LANTUS SOLOSTAR 100 UNIT/ML Solostar Pen INJECT 34 UNITS IN AM & 32 UNITS IN PM. INCREASE BY 2 UNITS EVERY 3 DAYS AS LONG AS ABOVE 100  4  . lisinopril (PRINIVIL,ZESTRIL) 20 MG tablet Take 1 tablet (20 mg total) by mouth 2 (two) times daily. 90 tablet 2  . metFORMIN (GLUCOPHAGE) 1000 MG tablet TAKE 1 TABLET TWICE DAILY AND 1/2 TABLET AT NOON 75 tablet 1  . simvastatin (ZOCOR) 10 MG tablet Take 1 tablet (10 mg total) by mouth daily. 30 tablet 3  . traMADol (ULTRAM) 50 MG tablet Take 1 tablet (50 mg total) by mouth every 6 (six) hours as needed. 30 tablet 0  . Vitamin D, Ergocalciferol, (DRISDOL) 50000 units CAPS capsule Take 1 capsule (50,000 Units total) by mouth every 7 (seven) days. 12 capsule 0  . predniSONE (DELTASONE) 10 MG tablet Take 1 tablet (10 mg total) by mouth daily with breakfast. (Patient not taking: Reported on 11/19/2016) 30 tablet 1   No current facility-administered medications on file prior to visit.     BP (!) 178/95 Comment: pt did not take med this am for her bp.  Pulse 88   Temp 98.3 F (36.8 C) (Oral)   Resp 16   Ht 5\' 2"  (1.575 m)   Wt 159 lb (72.1 kg)   LMP 02/12/2011 (Exact Date)   SpO2 99%   BMI 29.08 kg/m       Objective:   Physical Exam  General  Mental Status - Alert. General Appearance - Well groomed. Not in acute distress.  Skin Rashes- No Rashes.  HEENT Head- Normal. Ear Auditory Canal - Left- Normal. Right - Normal.Tympanic Membrane- Left- Normal. Right- wax blocking view of tm(post lavage tm faint red and canal is cleared) Post lavage blocked sensation cleared. Tm faint pink red likley from  trauma of procedure but intact. Eye Sclera/Conjunctiva- Left- Normal. Right- Normal. Nose & Sinuses Nasal Mucosa- Left-  Boggy and Congested. Right-  Boggy and  Congested.Bilateral maxillary and frontal sinus pressure. Mouth & Throat Lips: Upper Lip- Normal: no dryness, cracking, pallor, cyanosis, or vesicular eruption. Lower Lip-Normal: no dryness, cracking, pallor, cyanosis or vesicular eruption. Buccal Mucosa- Bilateral- No Aphthous ulcers. Oropharynx- No Discharge or Erythema. Tonsils: Characteristics- Bilateral- No Erythema or Congestion. Size/Enlargement- Bilateral- No enlargement. Discharge- bilateral-None.  Neck Neck-  Supple. No Masses.   Chest and Lung Exam Auscultation: Breath Sounds:-Clear even and unlabored.  Cardiovascular Auscultation:Rythm- Regular, rate and rhythm. Murmurs & Other Heart Sounds:Ausculatation of the heart reveal- No Murmurs.  Lymphatic Head & Neck General Head & Neck Lymphatics: Bilateral: Description- No Localized lymphadenopathy.   Breast exam I do feel possible lump under the lt  nipple. The area feels harder than rt side. Normal color to  Both breast. No nipple dc. No inversion of nipples. Axillary area show no palpable lymph nodes.  .    Assessment & Plan:  For your nasal congestion and possible allergies will rx flonase nasal spray. Can add astelin to flonase if after one week symptoms persist.  Wax cleared by lavage today.  For your recent new breast pain with possible lump will get diagnostic mammogram. Would ask you go downstairs today and get that scheduled(Very important. You will also likely need to let them know where last mammogram was done.  Follow up in 2 weeks or as needed  Pt did not take her bp med this am. She will take when get home. No cardiac or neurologic signs or symptoms  Advised if ear pain on lavage side occurs let us know and could rx drops and oral antibiotic but current appearance likely from procdure and adhesed  wax to tm before [employer], Percell Miller, [employer]

## 2016-11-19 NOTE — Telephone Encounter (Signed)
They said they would look at the new orders and call back if there were any problems.

## 2016-11-19 NOTE — Addendum Note (Signed)
Addended by: Anabel Halon on: 11/19/2016 01:29 PM   Modules accepted: Orders

## 2016-11-19 NOTE — Addendum Note (Signed)
Addended by: Anabel Halon on: 11/19/2016 01:38 PM   Modules accepted: Orders

## 2016-11-19 NOTE — Progress Notes (Signed)
Pre visit review using our clinic review tool, if applicable. No additional management support is needed unless otherwise documented below in the visit note/SLS  

## 2016-11-19 NOTE — Telephone Encounter (Signed)
Will you clarify with breast center. I put in diagnostic bilateral breast and order [name] of left breast and axilla.

## 2016-11-20 ENCOUNTER — Other Ambulatory Visit: Payer: Self-pay | Admitting: Medical

## 2016-11-20 ENCOUNTER — Ambulatory Visit: Payer: BLUE CROSS/BLUE SHIELD | Admitting: Family Medicine

## 2016-11-20 DIAGNOSIS — N63 Unspecified lump in unspecified breast: Secondary | ICD-10-CM

## 2016-11-20 DIAGNOSIS — N644 Mastodynia: Secondary | ICD-10-CM

## 2016-11-26 ENCOUNTER — Other Ambulatory Visit: Payer: Self-pay | Admitting: Medical

## 2016-11-26 ENCOUNTER — Ambulatory Visit
Admission: RE | Admit: 2016-11-26 | Discharge: 2016-11-26 | Disposition: A | Discharge status: Home / Self Care | Payer: BLUE CROSS/BLUE SHIELD | Source: Ambulatory Visit | Attending: Medical | Admitting: Medical

## 2016-11-26 ENCOUNTER — Other Ambulatory Visit: Payer: BLUE CROSS/BLUE SHIELD

## 2016-11-26 DIAGNOSIS — N644 Mastodynia: Secondary | ICD-10-CM

## 2016-11-26 DIAGNOSIS — N63 Unspecified lump in unspecified breast: Secondary | ICD-10-CM

## 2016-11-26 DIAGNOSIS — N6320 Unspecified lump in the left breast, unspecified quadrant: Secondary | ICD-10-CM

## 2016-11-27 ENCOUNTER — Other Ambulatory Visit: Payer: BLUE CROSS/BLUE SHIELD

## 2016-11-28 ENCOUNTER — Telehealth: Payer: Self-pay | Admitting: Family Medicine

## 2016-11-28 ENCOUNTER — Encounter: Payer: Self-pay | Admitting: Family Medicine

## 2016-11-28 ENCOUNTER — Other Ambulatory Visit: Payer: Self-pay | Admitting: Medical

## 2016-11-28 ENCOUNTER — Other Ambulatory Visit: Payer: BLUE CROSS/BLUE SHIELD

## 2016-11-28 DIAGNOSIS — N6320 Unspecified lump in the left breast, unspecified quadrant: Secondary | ICD-10-CM

## 2016-11-28 DIAGNOSIS — N644 Mastodynia: Secondary | ICD-10-CM

## 2016-11-28 NOTE — Telephone Encounter (Signed)
Spoke with pt. Requests letter due to her back pain, neuropathy in leg. States she is currently out of back due to her medical problems. Please advise?

## 2016-11-28 NOTE — Telephone Encounter (Signed)
She can have a letter excusing her from jury duty due to severe back pain, peripheral neuropathy and poorly controlled diabetes mellitus type 2. Please format and print

## 2016-11-28 NOTE — Telephone Encounter (Signed)
Letter completed/PCP sigend. Called the patient to inform letter is ready for pickup at the front desk.

## 2016-11-28 NOTE — Telephone Encounter (Signed)
Relation to NG:EXBM Call back number: [phone]   Reason for call:  Patient requesting a letter excusing patient from [employer] [facility] Duty due for medical condition , please advise

## 2016-12-01 ENCOUNTER — Ambulatory Visit
Admission: RE | Admit: 2016-12-01 | Discharge: 2016-12-01 | Disposition: A | Discharge status: Home / Self Care | Payer: BLUE CROSS/BLUE SHIELD | Source: Ambulatory Visit | Attending: Medical | Admitting: Medical

## 2016-12-01 ENCOUNTER — Encounter: Payer: Self-pay | Admitting: Family Medicine

## 2016-12-01 ENCOUNTER — Ambulatory Visit (INDEPENDENT_AMBULATORY_CARE_PROVIDER_SITE_OTHER): Payer: BLUE CROSS/BLUE SHIELD | Admitting: Family Medicine

## 2016-12-01 VITALS — BP 128/82 | HR 91 | Ht 62.0 in | Wt 156.2 lb

## 2016-12-01 DIAGNOSIS — M48062 Spinal stenosis, lumbar region with neurogenic claudication: Secondary | ICD-10-CM | POA: Diagnosis not present

## 2016-12-01 DIAGNOSIS — N644 Mastodynia: Secondary | ICD-10-CM

## 2016-12-01 DIAGNOSIS — M48061 Spinal stenosis, lumbar region without neurogenic claudication: Secondary | ICD-10-CM | POA: Diagnosis not present

## 2016-12-01 DIAGNOSIS — N6320 Unspecified lump in the left breast, unspecified quadrant: Secondary | ICD-10-CM

## 2016-12-01 NOTE — Progress Notes (Signed)
[name] [address] [place], [place] [address] Phone: [phone] Subjective:    I'm seeing this patient by the request  of:  Tiffany Homans, MD   CC: Low back pain  [id number]  [name] is a 51 y.o. female coming in with complaint of Low back pain. Patient has had significant different medical problems recently she including a cellulitis that unfortunately led to the amputation of the left large toe. Patient states that she has had pain now going on nearly a month. Patient has been seen by an outside facility had been diagnosed with left-sided herniated disc at L4-L5 with moderate spinal stenosis. This was independently visualized by me on an MRI 05/26/2016.   Update 2/26 Patient was seen previously and was to start increasing gabapentin 300 mg. Patient was put on once weekly vitamin D and do home exercises. Patient states No significant improvement at this time. If anything seems to be worsening. More of a chronic numbness as well as some mild weakness in the left leg at this time. States it is waking her up at night. Feels the gabapentin is not helping.  March 26th 2018 update-patient was to be sent for an epidural. Patient was unable to tolerate it. Continues him pain in the back that seems to radiate down the leg still. States that she feels that the weakness in the left leg is worsening as well. Describes the pain as a dull, throbbing aching sensation. States on day and is affecting daily activities. Has been out of for extensor previous surgery but continues to have the pain at this moment.    Past Medical History:  Diagnosis Date  . Acute bronchitis 07/08/2011  . COMMON MIGRAINE 10/07/2010  . Decreased visual acuity 11/10/2016  . Depression 12/18/2012  . Diabetes mellitus type II, uncontrolled ([place]) 10/07/2010   Qualifier: Diagnosis of  By: Charlett Blake MD, [name]    . Diabetic foot infection ([place]) 08/26/2016  . Disturbance of skin sensation  10/07/2010  . HEART MURMUR, HX OF 10/07/2010  . Hyperlipidemia 12/06/2010  . Hypertension   . Lipoma of abdominal wall 10/05/2016  . NEPHROLITHIASIS, HX OF 10/07/2010  . Otitis media 11/10/2016  . Overweight(278.02) 12/06/2010  . PERIPHERAL NEUROPATHY, FEET 10/07/2010  . PVD (peripheral vascular disease) ([place]) 01/21/2012  . RESTLESS LEG SYNDROME 10/25/2010  . SOB (shortness of breath) 04/01/2012  . Stroke [facility]) 2014, 2017   most recently in 2/17 - intracerebral hemorrhage  . Weakness of right side of body 04/19/2013   Past Surgical History:  Procedure Laterality Date  . AMPUTATION TOE Left 09/09/2016   Procedure: AMPUTATION OF LEFT GREAT TOE;  Surgeon: Milly Jakob, MD;  Location: [facility];  Service: Orthopedics;  Laterality: Left;  . WISDOM TOOTH EXTRACTION     Social History   Social History  . Marital status: Single    Spouse name: N/A  . Number of children: N/A  . Years of education: N/A   Occupational History  . machine operator    Social History Main Topics  . Smoking status: Former Smoker    Packs/day: 0.50    Quit date: 09/08/2005  . Smokeless tobacco: Never Used  . Alcohol use No  . Drug use: No  . Sexual activity: Yes    Partners: Male    Birth control/ protection: None   Other Topics Concern  . None   Social History Narrative   Lives with husband in a two story home.  Has one child.     Works as a Glass blower/designer.     Education: high school.   Allergies  Allergen Reactions  . Lyrica [Pregabalin] Other (See Comments)    MADE PATIENT VERY EMOTIONAL AND WOULD CRY EASILY  . Morphine And Related Nausea And Vomiting  . Tramadol Nausea And Vomiting    Pt cant tolerate this pain med.    Family History  Problem Relation Age of Onset  . Arthritis Mother   . Stroke Brother     previous smoker  . Alcohol abuse Brother     in remission  . Leukemia Brother   . Diabetes Paternal Grandmother   . Healthy Son     Past medical history, social,  surgical and family history all reviewed in electronic medical record.  No pertanent information unless stated regarding to the chief complaint.   Review of Systems: No visual changes, nausea, vomiting, diarrhea, constipation, dizziness, abdominal pain, skin rash, fevers, chills, night sweats, weight loss, swollen lymph nodes,chest pain, shortness of breath, mood changes.  Continue positive for muscle aches, body aches, headaches  Objective  Blood pressure 128/82, pulse 91, height 5\' 2"  (1.575 m), weight 156 lb 3.2 oz (70.9 kg), last menstrual period 02/12/2011, SpO2 97 %.  Systems examined below as of 12/01/16 General: NAD A&O x3 mood, affect normal  HEENT: Pupils equal, extraocular movements intact no nystagmus Respiratory: not short of breath at rest or with speaking Cardiovascular: No lower extremity edema, non tender Skin: Warm dry intact with no signs of infection or rash on extremities or on axial skeleton. Abdomen: Soft nontender, no masses Neuro: Cranial nerves  intact, neurovascularly intact in all extremities with 2+ DTRs and 2+ pulses. Lymph: No lymphadenopathy appreciated today  Gait more of an antalgic gait  MSK: Non tender with full range of motion and good stability and symmetric strength and tone of shoulders, elbows, wrist,  knee hips and ankles bilaterally.     Back Exam:  Inspection: Unremarkable  Motion: Flexion 30 deg, Extension 15 deg, Side Bending to 30 deg bilaterally,  Rotation to 30 deg bilaterally  SLR laying: Positive left and now positive at 20 of flexion XSLR laying: Positive with symptoms going on the left leg Palpable tenderness: Continued tenderness mostly over the paraspinal musculature but now left greater than right. Worsening from previous exam FABER: Increasing tightness of hamstrings and piriformis bilaterally Sensory change: Gross sensation intact to all lumbar and sacral dermatomes.  Reflexes are 1+ on the left and 2+ on the right at the  Achilles Strength at foot  4 out of 5 strength with mild clonus noted of the left foot of 2 beats. Still noted. Strength seems to be stable.       Impression and Recommendations:     This case required medical decision making of moderate complexity.      Note: This dictation was prepared with Dragon dictation along with smaller phrase technology. Any transcriptional errors that result from this process are unintentional.

## 2016-12-01 NOTE — Patient Instructions (Signed)
Good to see you  I am sorry not better [facility] will call you  I am here if you have questions.

## 2016-12-01 NOTE — Assessment & Plan Note (Signed)
Patient is having worsening symptoms overall. I do believe that this is secondary to more of her spinal stenosis. I think that the pain in the back is unrelenting. The radicular symptoms some mild weakness is concerning. Patient was unable to tolerate the epidural and patient will be sent to neurosurgery for further evaluation and treatment. Patient will continue to try to stay active. We discussed icing regimen and other potential treatment but patient has truly failed all conservative therapy.  Spent  25 minutes with patient face-to-face and had greater than 50% of counseling including as described above in assessment and plan.

## 2016-12-01 NOTE — Progress Notes (Signed)
amb  

## 2016-12-02 ENCOUNTER — Telehealth: Payer: Self-pay | Admitting: Family Medicine

## 2016-12-02 NOTE — Telephone Encounter (Signed)
Left patient a message that the letter excusing her from jury duty is up front for her to pick up.

## 2016-12-02 NOTE — Telephone Encounter (Signed)
Pt states she would like  A written  note excusing her from jury duty because of her back. Her scheduled [employer] duty is April the 16th. Please advise.

## 2016-12-04 ENCOUNTER — Ambulatory Visit: Payer: BLUE CROSS/BLUE SHIELD | Admitting: Endocrinology

## 2016-12-15 ENCOUNTER — Encounter: Payer: Self-pay | Admitting: Vascular Surgery

## 2016-12-22 ENCOUNTER — Other Ambulatory Visit: Payer: Self-pay | Admitting: Vascular Surgery

## 2016-12-22 DIAGNOSIS — I739 Peripheral vascular disease, unspecified: Secondary | ICD-10-CM

## 2016-12-22 DIAGNOSIS — R6889 Other general symptoms and signs: Secondary | ICD-10-CM

## 2016-12-24 ENCOUNTER — Ambulatory Visit (HOSPITAL_COMMUNITY): Payer: BLUE CROSS/BLUE SHIELD

## 2016-12-24 ENCOUNTER — Encounter: Payer: BLUE CROSS/BLUE SHIELD | Admitting: Vascular Surgery

## 2016-12-28 ENCOUNTER — Other Ambulatory Visit: Payer: Self-pay | Admitting: Family Medicine

## 2016-12-30 LAB — HM DIABETES EYE EXAM

## 2017-01-02 ENCOUNTER — Telehealth: Payer: Self-pay | Admitting: Family Medicine

## 2017-01-02 NOTE — Telephone Encounter (Signed)
Caller name: Relationship to patient: Self Can be reached: [phone]  Pharmacy:  Reason for call: Patient request a call back to discuss OTC medication for back pain

## 2017-01-05 ENCOUNTER — Encounter: Payer: Self-pay | Admitting: Family Medicine

## 2017-01-15 ENCOUNTER — Ambulatory Visit (INDEPENDENT_AMBULATORY_CARE_PROVIDER_SITE_OTHER): Payer: BLUE CROSS/BLUE SHIELD | Admitting: Medical

## 2017-01-15 ENCOUNTER — Telehealth: Payer: Self-pay | Admitting: Medical

## 2017-01-15 VITALS — BP 154/79 | HR 83 | Temp 98.2°F | Resp 16 | Ht 62.0 in | Wt 161.8 lb

## 2017-01-15 DIAGNOSIS — G8929 Other chronic pain: Secondary | ICD-10-CM

## 2017-01-15 DIAGNOSIS — M5441 Lumbago with sciatica, right side: Secondary | ICD-10-CM | POA: Diagnosis not present

## 2017-01-15 DIAGNOSIS — M25551 Pain in right hip: Secondary | ICD-10-CM | POA: Diagnosis not present

## 2017-01-15 DIAGNOSIS — S90821A Blister (nonthermal), right foot, initial encounter: Secondary | ICD-10-CM | POA: Diagnosis not present

## 2017-01-15 NOTE — Progress Notes (Signed)
Subjective:    [name], female    DOB: 1966-06-11, 51 y.o.   MRN: [id number]  HPI  Pt in for follow up.   Pt has recent blister to her left foot. Pt not sure who should treat her foot. Pt has uncontrolled diabetes. Pt had prior amputation of toe after wound occured. Husband saw new blister 2 days ago. Pt not sure when blister came up.  Pt also has some back pain. She uses back brace. She has norco in the past. It helps pain but she feels like it is too strong. She feels sedated and groggy with this. Pt wants to know if something less potent. Pt states has about 4 pills left. 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 Last rx norco filled in January per epic. Pt feels like has to work. Pt states Dr Tamala Julian advised increasing gabapentin 300 mg 2 tab po tid. But hip pain still persists. Pt has spinal stenosis with pain radiating to rt hip.  Pt has plan to get surgery in future but she states already took time off due to her foot issues. Prior      Review of Systems  Constitutional: Negative for chills and fatigue.  Respiratory: Negative for cough, choking, chest tightness, shortness of breath and wheezing.   Cardiovascular: Negative for chest pain and palpitations.  Gastrointestinal: Negative for abdominal pain, constipation, diarrhea, nausea and vomiting.  Musculoskeletal: Positive for back pain.       Hip pain/groin.  Skin:       Toe blister  Hematological: Negative for adenopathy. Does not bruise/bleed easily.  Psychiatric/Behavioral: Negative for behavioral problems and confusion.    Past Medical History:  Diagnosis Date  . Acute bronchitis 07/08/2011  . COMMON MIGRAINE 10/07/2010  . Decreased visual acuity 11/10/2016  . Depression 12/18/2012  . Diabetes mellitus type II, uncontrolled ([place]) 10/07/2010   Qualifier: Diagnosis of  By: Charlett Blake MD, [name]    . Diabetic foot infection ([place]) 08/26/2016  . Disturbance of skin sensation 10/07/2010  . HEART MURMUR,  HX OF 10/07/2010  . Hyperlipidemia 12/06/2010  . Hypertension   . Lipoma of abdominal wall 10/05/2016  . NEPHROLITHIASIS, HX OF 10/07/2010  . Otitis media 11/10/2016  . Overweight(278.02) 12/06/2010  . PERIPHERAL NEUROPATHY, FEET 10/07/2010  . PVD (peripheral vascular disease) ([place]) 01/21/2012  . RESTLESS LEG SYNDROME 10/25/2010  . SOB (shortness of breath) 04/01/2012  . Stroke [facility]) 2014, 2017   most recently in 2/17 - intracerebral hemorrhage  . Weakness of right side of body 04/19/2013     Social History   Social History  . Marital status: Single    Spouse name: N/A  . Number of children: N/A  . Years of education: N/A   Occupational History  . machine operator    Social History Main Topics  . Smoking status: Former Smoker    Packs/day: 0.50    Quit date: 09/08/2005  . Smokeless tobacco: Never Used  . Alcohol use No  . Drug use: No  . Sexual activity: Yes    Partners: Male    Birth control/ protection: None   Other Topics Concern  . Not on file   Social History Narrative   Lives with husband in a two story home.  Has one child.     Works as a Glass blower/designer.     Education: high school.    Past Surgical History:  Procedure Laterality Date  . AMPUTATION TOE Left  09/09/2016   Procedure: AMPUTATION OF LEFT GREAT TOE;  Surgeon: Milly Jakob, MD;  Location: [place];  Service: Orthopedics;  Laterality: Left;  . WISDOM TOOTH EXTRACTION      Family History  Problem Relation Age of Onset  . Arthritis Mother   . Stroke Brother        previous smoker  . Alcohol abuse Brother        in remission  . Leukemia Brother   . Diabetes Paternal Grandmother   . Healthy Son     Allergies  Allergen Reactions  . Lyrica [Pregabalin] Other (See Comments)    MADE PATIENT VERY EMOTIONAL AND WOULD CRY EASILY  . Morphine And Related Nausea And Vomiting  . Tramadol Nausea And Vomiting    Pt cant tolerate this pain med.     Current Outpatient Prescriptions on File  Prior to Visit  Medication Sig Dispense Refill  . aspirin EC 81 MG tablet Take 81 mg by mouth daily.    Marland Kitchen azelastine (ASTELIN) 0.1 % nasal spray Place 2 sprays into both nostrils 2 (two) times daily. Use in each nostril as directed 30 mL 3  . escitalopram (LEXAPRO) 10 MG tablet Take 1 tablet (10 mg total) by mouth daily. 30 tablet 2  . fluticasone (FLONASE) 50 MCG/ACT nasal spray Place 2 sprays into both nostrils daily. 16 g 1  . gabapentin (NEURONTIN) 100 MG capsule Take 200 mg by mouth daily.   3  . glucose blood (ONE TOUCH ULTRA TEST) test strip Use once daily to check blood sugar.  DX E11.9 100 each 11  . HYDROcodone-acetaminophen (NORCO/VICODIN) 5-325 MG tablet Take 1 tablet by mouth as needed.    . Insulin Pen Needle 32G X 4 MM MISC Use as directed for insulin.DX E11.65 100 each 6  . Lancets (FREESTYLE) lancets DX- 250.02  Pts machine is Freestyle Freedom Lite 100 each 5  . LANTUS SOLOSTAR 100 UNIT/ML Solostar Pen INJECT 34 UNITS IN AM & 32 UNITS IN PM. INCREASE BY 2 UNITS EVERY 3 DAYS AS LONG AS ABOVE 100  4  . LANTUS SOLOSTAR 100 UNIT/ML Solostar Pen INJECT 34 UNITS IN AM & 32 UNITS IN PM. INCREASE BY 2 UNITS EVERY 3 DAYS AS LONG AS ABOVE 100 15 mL 4  . lisinopril (PRINIVIL,ZESTRIL) 20 MG tablet Take 1 tablet (20 mg total) by mouth 2 (two) times daily. 90 tablet 2  . metFORMIN (GLUCOPHAGE) 1000 MG tablet TAKE 1 TABLET TWICE DAILY AND 1/2 TABLET AT NOON 75 tablet 1  . predniSONE (DELTASONE) 10 MG tablet Take 1 tablet (10 mg total) by mouth daily with breakfast. 30 tablet 1  . simvastatin (ZOCOR) 10 MG tablet Take 1 tablet (10 mg total) by mouth daily. 30 tablet 3  . traMADol (ULTRAM) 50 MG tablet Take 1 tablet (50 mg total) by mouth every 6 (six) hours as needed. 30 tablet 0  . Vitamin D, Ergocalciferol, (DRISDOL) 50000 units CAPS capsule Take 1 capsule (50,000 Units total) by mouth every 7 (seven) days. 12 capsule 0   No current facility-administered medications on file prior to visit.      BP (!) 154/79 (BP Location: Right Arm, Patient Position: Sitting, Cuff Size: Normal)   Pulse 83   Temp 98.2 F (36.8 C) (Oral)   Resp 16   Ht 5\' 2"  (1.575 m)   Wt 161 lb 12.8 oz (73.4 kg)   LMP 02/12/2011 (Exact Date)   SpO2 97%   BMI 29.59 kg/m  Objective:   Physical Exam  General- No acute distress. Pleasant patient. Neck- Full range of motion, no jvd Lungs- Clear, even and unlabored. Heart- regular rate and rhythm. Neurologic- CNII- XII grossly intact.  Back-mid back pain on palpation. Pain on straight leg lift. No foot drop.  Rt hip- mild pain on papation rt hip. On range of motion rt groin pain.  Rt foot- medial aspect 2nd toe 5 mm x 8 mm blister. Normal color. No ulceration. Foot not warm. Toe not warm either.      Assessment & Plan:  For hip pain xray of rt hip. Can get on Saturday.  For chronic back pain continue the neurontin. You might try 1/2 tab of your current norco every 6-8 hours. See if this controls pain and give less sedation. If this works for you then can discuss with your pcp and see if we can put you on pain contract or maybe refer to pain management.  Continue neurontin for pain as well.  For toe blister referral to Dr. Grandville Silos placed at [facility] ortho. Put bandaid over area to reduce friction. Color of blister looks normal might just be friction injury at this time. But if area worsens as discussed then be seen here, UC or ED pending refer to ortho.  Follow up in 10-14 day or as needed  [place], Percell Miller, [place]

## 2017-01-15 NOTE — Telephone Encounter (Signed)
Based on pt history could you get her in with podiatrist on Monday or tomorrow. I don't want to wait until Monday and then be told wed or Thursday. Imagine after being out he may be booked.  Will you call and check with her if that is ok.

## 2017-01-15 NOTE — Patient Instructions (Signed)
For hip pain xray of rt hip. Can get on Saturday.  For chronic back pain continue the neurontin. You might try 1/2 tab of your current norco every 6-8 hours. See if this controls pain and give less sedation. If this works for you then can discuss with your pcp and see if we can put you on pain contract or maybe refer to pain management.  Continue neurontin for pain as well.  For toe blister referral to Dr. Grandville Silos placed at [facility] ortho. Put bandaid over area to reduce friction. Color of blister looks normal might just be friction injury at this time. But if area worsens as discussed then be seen here, UC or ED pending refer to ortho.  Follow up in 10-14 day or as needed

## 2017-01-16 NOTE — Telephone Encounter (Signed)
Patient would rather wait and see Dr Grandville Silos when he comes back.

## 2017-01-17 ENCOUNTER — Ambulatory Visit (HOSPITAL_BASED_OUTPATIENT_CLINIC_OR_DEPARTMENT_OTHER)
Admission: RE | Admit: 2017-01-17 | Discharge: 2017-01-17 | Disposition: A | Discharge status: Home / Self Care | Payer: BLUE CROSS/BLUE SHIELD | Source: Ambulatory Visit | Attending: Medical | Admitting: Medical

## 2017-01-17 DIAGNOSIS — M25551 Pain in right hip: Secondary | ICD-10-CM | POA: Diagnosis not present

## 2017-01-22 ENCOUNTER — Telehealth: Payer: Self-pay | Admitting: Family Medicine

## 2017-01-22 NOTE — Telephone Encounter (Signed)
Pt brought FMLA forme for Dr Charlett Blake to fill out and fax when done. Placed forms in front office tray.

## 2017-01-27 NOTE — Telephone Encounter (Signed)
Forms received.  Paperwork process initiated.

## 2017-02-03 NOTE — Telephone Encounter (Signed)
Patient called to check the status of the paperwork, She has 3 more days and it will be too late please call patient [phone]

## 2017-02-03 NOTE — Telephone Encounter (Signed)
Noted.  Dr. Charlett Blake currently working on forms. Message forwarded to PCP.

## 2017-02-04 NOTE — Telephone Encounter (Signed)
Pt called wanting to know about status of FMLA paperwork, pt mentioned has a dead line to give in documents on February 06, 2017. Pt is needing documents completed by tomorrow 02-05-2017 at least since she has to take it til February 06, 2017. Please advise.

## 2017-02-05 NOTE — Telephone Encounter (Signed)
Patient checking on the status of FMLA paperwork,please advise

## 2017-02-05 NOTE — Telephone Encounter (Signed)
Paperwork has been faxed.  Fax confirmation received.  Pt notified and made aware.  She requested a copy to be picked up.  Copy made and placed up front for pick.

## 2017-02-10 ENCOUNTER — Telehealth: Payer: Self-pay | Admitting: Family Medicine

## 2017-02-10 NOTE — Telephone Encounter (Signed)
Pt dropped off document to be filled out (FMLA paperwork that was already done but is needing some changes on page 7 and to resent page 7 with the correction on it- pt stated needed to have 80 hrs on it and NOT weeks) Pt would like to be informed when document resent with corrections. (Pt had a written letter with the complete explanation of correction). Document put at front office tray.

## 2017-02-13 ENCOUNTER — Telehealth: Payer: Self-pay | Admitting: Family Medicine

## 2017-02-16 NOTE — Telephone Encounter (Signed)
Patient states that question number 7 on her FMLA form needs to be corrected into hours instead of days and re-faxed

## 2017-02-16 NOTE — Telephone Encounter (Signed)
Patient checking on the status of FMLA paperwork, please advise

## 2017-02-16 NOTE — Telephone Encounter (Signed)
Pt has already been called. She was advised that paperwork has already been resubmitted on 02/12/17 and to call back if forms need further revision.

## 2017-02-16 NOTE — Telephone Encounter (Signed)
Forms faxed by [facility], CMA on Thursday 02/12/17.  Called patient and made her aware.

## 2017-02-17 NOTE — Telephone Encounter (Signed)
Patient called back checking on the status of message below stating [place] informed patient paperwork was received but revisions were not made, patient states revisions need to reflect 80 hours a month interment.   Patient states forms reflect work 1 week and out the next week and patient states she cant afford financially to work like that.    [employer] # [phone]

## 2017-02-18 NOTE — Telephone Encounter (Signed)
Patient calling again, request status

## 2017-02-20 NOTE — Telephone Encounter (Signed)
Noted.  Message routed to [facility].

## 2017-02-20 NOTE — Telephone Encounter (Signed)
I have not seen these papers since they came back in for Dr. Charlett Blake to correct the [1] item and then Princess reported faxing them on 02/12/17 and contacting & informing patient. This will have to be communicated with [facility], as she was the last person to have the paperwork. Thanks/SLS 06/15

## 2017-02-20 NOTE — Telephone Encounter (Addendum)
Patient checking on the status of FMLA paperwork stating the deadline is Tuesday 02/24/17 #7 needs to indicate hours instead of days and patient states if you have any questions please call [phone]

## 2017-02-23 NOTE — Telephone Encounter (Signed)
Patient called to follow up on FMLA form. States she needs them completed today. Request a call back when it has been completed

## 2017-02-23 NOTE — Telephone Encounter (Signed)
followup with this message

## 2017-02-24 ENCOUNTER — Encounter: Payer: Self-pay | Admitting: Family Medicine

## 2017-02-25 NOTE — Telephone Encounter (Signed)
Pt calling back in regards to her FMLA paperwork, states she received a call stating that the paperwork was still incorrect, states the paperwork has 1 episode lasting up to 5 days, and it should state 5 or 19 episode a month. States that Dr. Charlett Blake had it correct when she filled it out previously before. Would like a call back asap.

## 2017-02-25 NOTE — Telephone Encounter (Signed)
Please correct form to say 5 to 19 episodes per month

## 2017-02-26 NOTE — Telephone Encounter (Signed)
Form has been completed for the third time hopefully this will help. I have faxed the forms over to [place] again. Fax confirmation received.   I have also spoke with the patient she is aware of the forms being filled out and faxed over.    PC

## 2017-03-02 ENCOUNTER — Ambulatory Visit (INDEPENDENT_AMBULATORY_CARE_PROVIDER_SITE_OTHER): Payer: BLUE CROSS/BLUE SHIELD | Admitting: Family Medicine

## 2017-03-02 VITALS — BP 118/60 | HR 88 | Temp 97.9°F | Resp 18 | Wt 154.8 lb

## 2017-03-02 DIAGNOSIS — E0842 Diabetes mellitus due to underlying condition with diabetic polyneuropathy: Secondary | ICD-10-CM

## 2017-03-02 DIAGNOSIS — I951 Orthostatic hypotension: Secondary | ICD-10-CM | POA: Diagnosis not present

## 2017-03-02 DIAGNOSIS — E782 Mixed hyperlipidemia: Secondary | ICD-10-CM | POA: Diagnosis not present

## 2017-03-02 DIAGNOSIS — M48062 Spinal stenosis, lumbar region with neurogenic claudication: Secondary | ICD-10-CM

## 2017-03-02 DIAGNOSIS — I10 Essential (primary) hypertension: Secondary | ICD-10-CM

## 2017-03-02 LAB — CBC
HCT: 34.8 % — ABNORMAL LOW (ref 36.0–46.0)
Hemoglobin: 11.5 g/dL — ABNORMAL LOW (ref 12.0–15.0)
MCHC: 33 g/dL (ref 30.0–36.0)
MCV: 84.3 fl (ref 78.0–100.0)
Platelets: 343 10*3/uL (ref 150.0–400.0)
RBC: 4.13 Mil/uL (ref 3.87–5.11)
RDW: 14 % (ref 11.5–15.5)
WBC: 7.3 10*3/uL (ref 4.0–10.5)

## 2017-03-02 LAB — COMPREHENSIVE METABOLIC PANEL
ALT: 15 U/L (ref 0–35)
AST: 12 U/L (ref 0–37)
Albumin: 3.6 g/dL (ref 3.5–5.2)
Alkaline Phosphatase: 104 U/L (ref 39–117)
BUN: 27 mg/dL — ABNORMAL HIGH (ref 6–23)
CO2: 28 mEq/L (ref 19–32)
Calcium: 9.4 mg/dL (ref 8.4–10.5)
Chloride: 100 mEq/L (ref 96–112)
Creatinine, Ser: 1 mg/dL (ref 0.40–1.20)
GFR: 61.59 mL/min (ref >60.00)
Glucose, Bld: 449 mg/dL — ABNORMAL HIGH (ref 70–99)
Potassium: 4.2 mEq/L (ref 3.5–5.1)
Sodium: 137 mEq/L (ref 135–145)
Total Bilirubin: 0.4 mg/dL (ref 0.2–1.2)
Total Protein: 6.8 g/dL (ref 6.0–8.3)

## 2017-03-02 LAB — LIPID PANEL
Cholesterol: 197 mg/dL (ref 0–200)
HDL: 32.8 mg/dL — ABNORMAL LOW (ref >39.00)
NonHDL: 164.62
Total CHOL/HDL Ratio: 6
Triglycerides: 220 mg/dL — ABNORMAL HIGH (ref 0.0–149.0)
VLDL: 44 mg/dL — ABNORMAL HIGH (ref 0.0–40.0)

## 2017-03-02 LAB — TSH: TSH: 3.05 u[IU]/mL (ref 0.35–4.50)

## 2017-03-02 LAB — LDL CHOLESTEROL, DIRECT: Direct LDL: 133 mg/dL

## 2017-03-02 LAB — HEMOGLOBIN A1C: Hgb A1c MFr Bld: 12 % — ABNORMAL HIGH (ref 4.6–6.5)

## 2017-03-02 MED ORDER — HYDROCODONE-ACETAMINOPHEN 5-325 MG PO TABS: 1.0000 | ORAL_TABLET | Freq: Four times a day (QID) | ORAL | 0 refills | Status: DC | PRN

## 2017-03-02 NOTE — Patient Instructions (Signed)
Let us know name of the other ortho you saw so we can get a release of records.  Back Pain, Adult Back pain is very common in adults.The cause of back pain is rarely dangerous and the pain often gets better over time.The cause of your back pain may not be known. Some common causes of back pain include:  Strain of the muscles or ligaments supporting the spine.  Wear and tear (degeneration) of the spinal disks.  Arthritis.  Direct injury to the back.  For many people, back pain may return. Since back pain is rarely dangerous, most people can learn to manage this condition on their own. Follow these instructions at home: Watch your back pain for any changes. The following actions may help to lessen any discomfort you are feeling:  Remain active. It is stressful on your back to sit or stand in one place for long periods of time. Do not sit, drive, or stand in one place for more than 30 minutes at a time. Take short walks on even surfaces as soon as you are able.Try to increase the length of time you walk each day.  Exercise regularly as directed by your health care provider. Exercise helps your back heal faster. It also helps avoid future injury by keeping your muscles strong and flexible.  Do not stay in bed.Resting more than 1-2 days can delay your recovery.  Pay attention to your body when you bend and lift. The most comfortable positions are those that put less stress on your recovering back. Always use proper lifting techniques, including: ? Bending your knees. ? Keeping the load close to your body. ? Avoiding twisting.  Find a comfortable position to sleep. Use a firm mattress and lie on your side with your knees slightly bent. If you lie on your back, put a pillow under your knees.  Avoid feeling anxious or stressed.Stress increases muscle tension and can worsen back pain.It is important to recognize when you are anxious or stressed and learn ways to manage it, such as with  exercise.  Take medicines only as directed by your health care provider. Over-the-counter medicines to reduce pain and inflammation are often the most helpful.Your health care provider may prescribe muscle relaxant drugs.These medicines help dull your pain so you can more quickly return to your normal activities and healthy exercise.  Apply ice to the injured area: ? Put ice in a plastic bag. ? Place a towel between your skin and the bag. ? Leave the ice on for 20 minutes, 2-3 times a day for the first 2-3 days. After that, ice and heat may be alternated to reduce pain and spasms.  Maintain a healthy weight. Excess weight puts extra stress on your back and makes it difficult to maintain good posture.  Contact a health care provider if:  You have pain that is not relieved with rest or medicine.  You have increasing pain going down into the legs or buttocks.  You have pain that does not improve in one week.  You have night pain.  You lose weight.  You have a fever or chills. Get help right away if:  You develop new bowel or bladder control problems.  You have unusual weakness or numbness in your arms or legs.  You develop nausea or vomiting.  You develop abdominal pain.  You feel faint. This information is not intended to replace advice given to you by your health care provider. Make sure you discuss any questions you have  with your health care provider. Document Released: 08/25/2005 Document Revised: 01/03/2016 Document Reviewed: 12/27/2013 Elsevier Interactive Patient Education  2017 [employer].

## 2017-03-02 NOTE — Telephone Encounter (Signed)
[name] denied form after office visit 02/20/17 stating #7 has to read exactly this  "10 episodes lasting 1 to 8 hours a day as needed per month" re fax to [facility] please with the new revision.

## 2017-03-02 NOTE — Assessment & Plan Note (Signed)
Normotensive today.

## 2017-03-02 NOTE — Progress Notes (Signed)
Subjective:  I acted as a Education administrator for Dr. Charlett Blake. [place], [place]  [name], female    DOB: 01/23/1966, 51 y.o.   MRN: [id number]  Chief Complaint  Patient presents with  . Follow-up    HPI  Patient is in today for a follow up. Patient is following up on herHTN, DM, hyperlipidemia and back pain. Patient also wants to discuss her FMLA forms that have been completed. No recent febrile illness or acute hospitalizations. Denies CP/palp/SOB/HA/congestion/fevers/GI or GU c/o. Taking meds as prescribed  report if symptoms worsen or seek immediate care. Has seen two orthopaedists and they have differing appointments about whether she should have surgery on her low back and she is struggling to work. She is using pain meds and Gabapentin to get by. Is interested in a 3rd opinion, we will request records from second orthopaedist and consider repeat MRI since last one was done in September of 2017 and her pain is worsening.    Patient Care Team: Mosie Lukes, MD as PCP - General (Family Medicine) Alda Berthold, DO as Consulting Physician (Neurology) Pieter Partridge, DO as Consulting Physician (Neurology) Josue Hector, MD as Consulting Physician (Cardiology) Ricard Dillon, MD as Consulting Physician (Internal Medicine)   Past Medical History:  Diagnosis Date  . Acute bronchitis 07/08/2011  . COMMON MIGRAINE 10/07/2010  . Decreased visual acuity 11/10/2016  . Depression 12/18/2012  . Diabetes mellitus type II, uncontrolled ([place]) 10/07/2010   Qualifier: Diagnosis of  By: Charlett Blake MD, [name]    . Diabetic foot infection ([place]) 08/26/2016  . Disturbance of skin sensation 10/07/2010  . HEART MURMUR, HX OF 10/07/2010  . Hyperlipidemia 12/06/2010  . Hypertension   . Lipoma of abdominal wall 10/05/2016  . NEPHROLITHIASIS, HX OF 10/07/2010  . Otitis media 11/10/2016  . Overweight(278.02) 12/06/2010  . PERIPHERAL NEUROPATHY, FEET 10/07/2010  . PVD (peripheral vascular disease) ([place])  01/21/2012  . RESTLESS LEG SYNDROME 10/25/2010  . SOB (shortness of breath) 04/01/2012  . Stroke [facility]) 2014, 2017   most recently in 2/17 - intracerebral hemorrhage  . Weakness of right side of body 04/19/2013    Past Surgical History:  Procedure Laterality Date  . AMPUTATION TOE Left 09/09/2016   Procedure: AMPUTATION OF LEFT GREAT TOE;  Surgeon: Milly Jakob, MD;  Location: [place];  Service: Orthopedics;  Laterality: Left;  . WISDOM TOOTH EXTRACTION      Family History  Problem Relation Age of Onset  . Arthritis Mother   . Stroke Brother        previous smoker  . Alcohol abuse Brother        in remission  . Leukemia Brother   . Diabetes Paternal Grandmother   . Healthy Son     Social History   Social History  . Marital status: Single    Spouse name: N/A  . Number of children: N/A  . Years of education: N/A   Occupational History  . machine operator    Social History Main Topics  . Smoking status: Former Smoker    Packs/day: 0.50    Quit date: 09/08/2005  . Smokeless tobacco: Never Used  . Alcohol use No  . Drug use: No  . Sexual activity: Yes    Partners: Male    Birth control/ protection: None   Other Topics Concern  . Not on file   Social History Narrative   Lives with husband in a two story home.  Has  one child.     Works as a Glass blower/designer.     Education: high school.    Outpatient Medications Prior to Visit  Medication Sig Dispense Refill  . aspirin EC 81 MG tablet Take 81 mg by mouth daily.    Marland Kitchen azelastine (ASTELIN) 0.1 % nasal spray Place 2 sprays into both nostrils 2 (two) times daily. Use in each nostril as directed 30 mL 3  . escitalopram (LEXAPRO) 10 MG tablet Take 1 tablet (10 mg total) by mouth daily. 30 tablet 2  . fluticasone (FLONASE) 50 MCG/ACT nasal spray Place 2 sprays into both nostrils daily. 16 g 1  . glucose blood (ONE TOUCH ULTRA TEST) test strip Use once daily to check blood sugar.  DX E11.9 100 each 11  .  Insulin Pen Needle 32G X 4 MM MISC Use as directed for insulin.DX E11.65 100 each 6  . Lancets (FREESTYLE) lancets DX- 250.02  Pts machine is Freestyle Freedom Lite 100 each 5  . LANTUS SOLOSTAR 100 UNIT/ML Solostar Pen INJECT 34 UNITS IN AM & 32 UNITS IN PM. INCREASE BY 2 UNITS EVERY 3 DAYS AS LONG AS ABOVE 100  4  . LANTUS SOLOSTAR 100 UNIT/ML Solostar Pen INJECT 34 UNITS IN AM & 32 UNITS IN PM. INCREASE BY 2 UNITS EVERY 3 DAYS AS LONG AS ABOVE 100 15 mL 4  . lisinopril (PRINIVIL,ZESTRIL) 20 MG tablet Take 1 tablet (20 mg total) by mouth 2 (two) times daily. 90 tablet 2  . metFORMIN (GLUCOPHAGE) 1000 MG tablet TAKE 1 TABLET TWICE DAILY AND 1/2 TABLET AT NOON 75 tablet 1  . predniSONE (DELTASONE) 10 MG tablet Take 1 tablet (10 mg total) by mouth daily with breakfast. 30 tablet 1  . simvastatin (ZOCOR) 10 MG tablet Take 1 tablet (10 mg total) by mouth daily. 30 tablet 3  . Vitamin D, Ergocalciferol, (DRISDOL) 50000 units CAPS capsule Take 1 capsule (50,000 Units total) by mouth every 7 (seven) days. 12 capsule 0  . gabapentin (NEURONTIN) 100 MG capsule Take 200 mg by mouth daily.  3  . HYDROcodone-acetaminophen (NORCO/VICODIN) 5-325 MG tablet Take 1 tablet by mouth as needed.    . traMADol (ULTRAM) 50 MG tablet Take 1 tablet (50 mg total) by mouth every 6 (six) hours as needed. 30 tablet 0   No facility-administered medications prior to visit.     Allergies  Allergen Reactions  . Lyrica [Pregabalin] Other (See Comments)    MADE PATIENT VERY EMOTIONAL AND WOULD CRY EASILY  . Morphine And Related Nausea And Vomiting  . Tramadol Nausea And Vomiting    Pt cant tolerate this pain med.     Review of Systems  Constitutional: Positive for malaise/fatigue. Negative for fever.  HENT: Negative for congestion.   Eyes: Negative for blurred vision.  Respiratory: Negative for cough and shortness of breath.   Cardiovascular: Negative for chest pain, palpitations and leg swelling.  Gastrointestinal:  Negative for vomiting.  Musculoskeletal: Positive for back pain, joint pain and myalgias.  Skin: Negative for rash.  Neurological: Positive for tingling and sensory change. Negative for loss of consciousness and headaches.       Objective:    Physical Exam  Constitutional: She is oriented to person, place, and time. She appears well-developed and well-nourished. No distress.  HENT:  Head: Normocephalic and atraumatic.  Eyes: Conjunctivae are normal.  Neck: Normal range of motion. No thyromegaly present.  Cardiovascular: Normal rate and regular rhythm.   Pulmonary/Chest: Effort normal and  breath sounds normal. She has no wheezes.  Abdominal: Soft. Bowel sounds are normal. There is no tenderness.  Musculoskeletal: Normal range of motion. She exhibits no edema or deformity.  Neurological: She is alert and oriented to person, place, and time.  Skin: Skin is warm and dry. She is not diaphoretic.  Psychiatric: She has a normal mood and affect.    BP 118/60 (BP Location: Left Arm, Patient Position: Sitting, Cuff Size: Normal)   Pulse 88   Temp 97.9 F (36.6 C) (Oral)   Resp 18   Wt 154 lb 12.8 oz (70.2 kg)   LMP 02/12/2011 (Exact Date)   SpO2 96%   BMI 28.31 kg/m  Wt Readings from Last 3 Encounters:  03/02/17 154 lb 12.8 oz (70.2 kg)  01/15/17 161 lb 12.8 oz (73.4 kg)  12/01/16 156 lb 3.2 oz (70.9 kg)   BP Readings from Last 3 Encounters:  03/02/17 118/60  01/15/17 (!) 154/79  12/01/16 128/82     Immunization History  Administered Date(s) Administered  . Influenza Split 05/22/2011, 06/29/2012  . Influenza Whole 05/09/2010  . Influenza,inj,Quad PF,36+ Mos 05/23/2013, 05/30/2014, 09/05/2016  . Td 09/08/2008    Health Maintenance  Topic Date Due  . PNEUMOCOCCAL POLYSACCHARIDE VACCINE (1) 12/13/1965  . PAP SMEAR  09/09/1991  . COLONOSCOPY  12/13/2013  . INFLUENZA VACCINE  04/08/2017  . HEMOGLOBIN A1C  04/12/2017  . FOOT EXAM  11/10/2017  . OPHTHALMOLOGY EXAM   12/30/2017  . TETANUS/TDAP  09/08/2018  . MAMMOGRAM  11/27/2018  . Hepatitis C Screening  Completed  . HIV Screening  Completed    Lab Results  Component Value Date   WBC 8.0 10/13/2016   HGB 12.1 10/13/2016   HCT 36.9 10/13/2016   PLT 393.0 10/13/2016   GLUCOSE 105 (H) 10/13/2016   CHOL 224 (H) 10/13/2016   TRIG 112.0 10/13/2016   HDL 51.50 10/13/2016   LDLDIRECT 136.4 04/01/2012   LDLCALC 150 (H) 10/13/2016   ALT 25 10/13/2016   AST 18 10/13/2016   NA 142 10/13/2016   K 4.1 10/13/2016   CL 105 10/13/2016   CREATININE 0.57 10/13/2016   BUN 18 10/13/2016   CO2 28 10/13/2016   TSH 2.82 10/13/2016   INR 1.07 03/30/2016   HGBA1C 8.8 (H) 10/13/2016   MICROALBUR 1.1 12/22/2011    Lab Results  Component Value Date   TSH 2.82 10/13/2016   Lab Results  Component Value Date   WBC 8.0 10/13/2016   HGB 12.1 10/13/2016   HCT 36.9 10/13/2016   MCV 87.4 10/13/2016   PLT 393.0 10/13/2016   Lab Results  Component Value Date   NA 142 10/13/2016   K 4.1 10/13/2016   CO2 28 10/13/2016   GLUCOSE 105 (H) 10/13/2016   BUN 18 10/13/2016   CREATININE 0.57 10/13/2016   BILITOT 0.4 10/13/2016   ALKPHOS 81 10/13/2016   AST 18 10/13/2016   ALT 25 10/13/2016   PROT 7.8 10/13/2016   ALBUMIN 4.0 10/13/2016   CALCIUM 9.8 10/13/2016   ANIONGAP 7 09/06/2016   GFR 118.00 10/13/2016   Lab Results  Component Value Date   CHOL 224 (H) 10/13/2016   Lab Results  Component Value Date   HDL 51.50 10/13/2016   Lab Results  Component Value Date   LDLCALC 150 (H) 10/13/2016   Lab Results  Component Value Date   TRIG 112.0 10/13/2016   Lab Results  Component Value Date   CHOLHDL 4 10/13/2016   Lab Results  Component Value Date   HGBA1C 8.8 (H) 10/13/2016         Assessment & Plan:   Problem List Items Addressed This Visit    Essential hypertension, benign - Primary   Relevant Orders   CBC   Comprehensive metabolic panel   TSH   Hyperlipidemia    Tolerating statin,  encouraged heart healthy diet, avoid trans fats, minimize simple carbs and saturated fats. Increase exercise as tolerated      Relevant Orders   Lipid panel   Diabetic polyneuropathy associated with diabetes mellitus due to underlying condition (HCC)    hgba1c acceptable, minimize simple carbs. Increase exercise as tolerated. Continue current meds      Relevant Medications   gabapentin (NEURONTIN) 100 MG capsule   Other Relevant Orders   Hemoglobin A1c   Orthostatic hypotension    Normotensive today      Spinal stenosis, lumbar region with neurogenic claudication    Encouraged moist heat and gentle stretching as tolerated. May try NSAIDs and prescription meds as directed and report if symptoms worsen or seek immediate care. Has seen two orthopaedists and they have differing appointments about whether she should have surgery on her low back and she is struggling to work. She is using pain meds and Gabapentin to get by. Is interested in a 3rd opinion, we will request records from second orthopaedist and consider repeat MRI since last one was done in September of 2017 and her pain is worsening         I have discontinued Ms. Hatchell's traMADol and HYDROcodone-acetaminophen. I am also having her start on HYDROcodone-acetaminophen. Additionally, I am having her maintain her freestyle, glucose blood, Insulin Pen Needle, metFORMIN, aspirin EC, LANTUS SOLOSTAR, predniSONE, simvastatin, Vitamin D (Ergocalciferol), escitalopram, lisinopril, fluticasone, azelastine, LANTUS SOLOSTAR, and gabapentin.  Meds ordered this encounter  Medications  . gabapentin (NEURONTIN) 100 MG capsule    Sig: Take 3 capsules (300 mg total) by mouth 3 (three) times daily.    Dispense:  90 capsule    Refill:  3  . HYDROcodone-acetaminophen (NORCO/VICODIN) 5-325 MG tablet    Sig: Take 1 tablet by mouth every 6 (six) hours as needed for moderate pain.    Dispense:  60 tablet    Refill:  0    CMA served as scribe  during this visit. History, Physical and Plan performed by medical provider. Documentation and orders reviewed and attested to.  Penni Homans, MD

## 2017-03-02 NOTE — Assessment & Plan Note (Signed)
hgba1c acceptable, minimize simple carbs. Increase exercise as tolerated. Continue current meds 

## 2017-03-02 NOTE — Assessment & Plan Note (Signed)
Encouraged moist heat and gentle stretching as tolerated. May try NSAIDs and prescription meds as directed and report if symptoms worsen or seek immediate care. Has seen two orthopaedists and they have differing appointments about whether she should have surgery on her low back and she is struggling to work. She is using pain meds and Gabapentin to get by. Is interested in a 3rd opinion, we will request records from second orthopaedist and consider repeat MRI since last one was done in September of 2017 and her pain is worsening

## 2017-03-02 NOTE — Assessment & Plan Note (Signed)
Tolerating statin, encouraged heart healthy diet, avoid trans fats, minimize simple carbs and saturated fats. Increase exercise as tolerated 

## 2017-03-03 ENCOUNTER — Telehealth: Payer: Self-pay | Admitting: *Deleted

## 2017-03-03 ENCOUNTER — Encounter: Payer: Self-pay | Admitting: Family Medicine

## 2017-03-03 NOTE — Telephone Encounter (Signed)
Paper work has been corrected again and faxed to the number provided.   PC

## 2017-03-03 NOTE — Telephone Encounter (Signed)
Received FMLA/STD paperwork from [employer] stating Clarification Required [Question #7 "needs a clear frequency with duration for episodic flare-ups the employee may experience that will require her to miss work d/t her condition, forwarded to provider/SLS 06/26

## 2017-03-13 ENCOUNTER — Other Ambulatory Visit: Payer: Self-pay | Admitting: Family Medicine

## 2017-03-26 ENCOUNTER — Telehealth: Payer: Self-pay | Admitting: Family Medicine

## 2017-03-26 NOTE — Telephone Encounter (Signed)
Got a call from her chiropractor Dr. Owens Shark with [facility] He is treating her for low back pain.  Today noted that both of her legs are swollen/ edematous but no sign of infection.  She does not seem to be in any resp distress  BP 159/96, 80 No history of CHF or renal insuf that I can see  She does have poorly controlled DM  Lab Results  Component Value Date   HGBA1C 12.0 (H) 03/02/2017    We will bring the pt in to be seen for an evaluation asap

## 2017-03-26 NOTE — Telephone Encounter (Signed)
Dr. Rhae Lerner pt.... Tried to contact pt to schedule follow up appt for lower extremity edema with tomorrows DOD with will be Dr. Nani Ravens at pt's convinence on 03/27/17. No answer, left voicemail for pt to call back to schedule appt.

## 2017-03-27 ENCOUNTER — Encounter: Payer: Self-pay | Admitting: Family Medicine

## 2017-03-27 ENCOUNTER — Ambulatory Visit (INDEPENDENT_AMBULATORY_CARE_PROVIDER_SITE_OTHER): Payer: BLUE CROSS/BLUE SHIELD | Admitting: Family Medicine

## 2017-03-27 VITALS — BP 120/70 | HR 76 | Temp 98.5°F | Resp 18 | Wt 159.2 lb

## 2017-03-27 DIAGNOSIS — M7989 Other specified soft tissue disorders: Secondary | ICD-10-CM

## 2017-03-27 NOTE — Progress Notes (Signed)
Chief Complaint  Patient presents with  . Leg Swelling    Swelling in both legs, on going for about 1 day.     [name] here for bilateral leg swelling.  Duration: 2 days Hx of prolonged bedrest, recent surgery, travel or injury? No Pain the calf? No SOB? No Personal or family history of clot or bleeding disorder? No Hx of heart failure, renal failure, hepatic failure? No  ROS:  MSK- +leg swelling, no pain Lungs- no SOB  Past Medical History:  Diagnosis Date  . Acute bronchitis 07/08/2011  . COMMON MIGRAINE 10/07/2010  . Decreased visual acuity 11/10/2016  . Depression 12/18/2012  . Diabetes mellitus type II, uncontrolled ([place]) 10/07/2010   Qualifier: Diagnosis of  By: Charlett Blake MD, [name]    . Diabetic foot infection ([place]) 08/26/2016  . Disturbance of skin sensation 10/07/2010  . HEART MURMUR, HX OF 10/07/2010  . Hyperlipidemia 12/06/2010  . Hypertension   . Lipoma of abdominal wall 10/05/2016  . NEPHROLITHIASIS, HX OF 10/07/2010  . Overweight(278.02) 12/06/2010  . PERIPHERAL NEUROPATHY, FEET 10/07/2010  . PVD (peripheral vascular disease) ([place]) 01/21/2012  . RESTLESS LEG SYNDROME 10/25/2010  . Stroke [facility]) 2014, 2017   most recently in 2/17 - intracerebral hemorrhage  . Weakness of right side of body 04/19/2013   Family History  Problem Relation Age of Onset  . Arthritis Mother   . Stroke Brother        previous smoker  . Alcohol abuse Brother        in remission  . Leukemia Brother   . Diabetes Paternal Grandmother   . Healthy Son    Past Surgical History:  Procedure Laterality Date  . AMPUTATION TOE Left 09/09/2016   Procedure: AMPUTATION OF LEFT GREAT TOE;  Surgeon: Milly Jakob, MD;  Location: [place];  Service: Orthopedics;  Laterality: Left;  . WISDOM TOOTH EXTRACTION      Current Outpatient Prescriptions:  .  aspirin EC 81 MG tablet, Take 81 mg by mouth daily., Disp: , Rfl:  .  azelastine (ASTELIN) 0.1 % nasal spray, Place 2 sprays into  both nostrils 2 (two) times daily. Use in each nostril as directed, Disp: 30 mL, Rfl: 3 .  escitalopram (LEXAPRO) 10 MG tablet, Take 1 tablet (10 mg total) by mouth daily., Disp: 30 tablet, Rfl: 2 .  fluticasone (FLONASE) 50 MCG/ACT nasal spray, Place 2 sprays into both nostrils daily., Disp: 16 g, Rfl: 1 .  gabapentin (NEURONTIN) 100 MG capsule, Take 3 capsules (300 mg total) by mouth 3 (three) times daily., Disp: 90 capsule, Rfl: 3 .  HYDROcodone-acetaminophen (NORCO/VICODIN) 5-325 MG tablet, Take 1 tablet by mouth every 6 (six) hours as needed for moderate pain., Disp: 60 tablet, Rfl: 0 .  Insulin Pen Needle 32G X 4 MM MISC, Use as directed for insulin.DX E11.65, Disp: 100 each, Rfl: 6 .  Lancets (FREESTYLE) lancets, DX- 250.02  Pts machine is Freestyle Freedom Lite, Disp: 100 each, Rfl: 5 .  LANTUS SOLOSTAR 100 UNIT/ML Solostar Pen, INJECT 34 UNITS IN AM & 32 UNITS IN PM. INCREASE BY 2 UNITS EVERY 3 DAYS AS LONG AS ABOVE 100, Disp: , Rfl: 4 .  LANTUS SOLOSTAR 100 UNIT/ML Solostar Pen, INJECT 34 UNITS IN AM & 32 UNITS IN PM. INCREASE BY 2 UNITS EVERY 3 DAYS AS LONG AS ABOVE 100, Disp: 15 mL, Rfl: 4 .  lisinopril (PRINIVIL,ZESTRIL) 20 MG tablet, Take 1 tablet (20 mg total) by mouth 2 (two) times  daily., Disp: 90 tablet, Rfl: 2 .  metFORMIN (GLUCOPHAGE) 1000 MG tablet, TAKE 1 TABLET TWICE DAILY AND 1/2 TABLET AT NOON, Disp: 75 tablet, Rfl: 1 .  ONE TOUCH ULTRA TEST test strip, USE ONCE DAILY TO CHECK BLOOD SUGAR. DX E11.9, Disp: 100 each, Rfl: 2 .  predniSONE (DELTASONE) 10 MG tablet, Take 1 tablet (10 mg total) by mouth daily with breakfast., Disp: 30 tablet, Rfl: 1 .  simvastatin (ZOCOR) 10 MG tablet, Take 1 tablet (10 mg total) by mouth daily., Disp: 30 tablet, Rfl: 3 .  Vitamin D, Ergocalciferol, (DRISDOL) 50000 units CAPS capsule, Take 1 capsule (50,000 Units total) by mouth every 7 (seven) days., Disp: 12 capsule, Rfl: 0  BP 120/70 (BP Location: Left Arm, Patient Position: Sitting, Cuff Size:  Normal)   Pulse 76   Temp 98.5 F (36.9 C) (Oral)   Resp 18   Wt 159 lb 3.2 oz (72.2 kg)   LMP 02/12/2011 (Exact Date)   SpO2 95%   BMI 29.12 kg/m  Gen- awake, alert, appears stated age Heart- RRR, 2/6 SEM heard loudest at aortic listening post, +LE edema 2+ b/l up to knees Lungs- CTAB, normal effort w/o accessory muscle use MSK- no calf pain b/l Psych: Age appropriate judgment and insight  Localized swelling of both lower extremities - Plan: TSH, Comprehensive metabolic panel  Orders as above. Low concern for PE, pt questions whether it was caused by chiropractor; I do not see how that would happen with regular HVLA.  Rx for compression stockings given. Elevate legs, mind the salt intake.  F/u prn. Pt voiced understanding and agreement to the plan.  [place], DO 03/27/17  2:37 PM

## 2017-03-27 NOTE — Telephone Encounter (Signed)
Called pt back. Scheduled appt with Dr. Nani Ravens today at 2pm for follow up on lower extremity edema.

## 2017-03-27 NOTE — Patient Instructions (Signed)
If you start having pain in the calf, shortness of breath or chest pain, seek immediate care.  Elevate your legs when able.  Stay as active as possible.  Let us know if you need anything.

## 2017-04-13 ENCOUNTER — Emergency Department (HOSPITAL_COMMUNITY)
Admission: EM | Admit: 2017-04-13 | Discharge: 2017-04-13 | Disposition: A | Discharge status: Home / Self Care | Payer: BLUE CROSS/BLUE SHIELD | Attending: Emergency Medicine | Admitting: Emergency Medicine

## 2017-04-13 DIAGNOSIS — Z794 Long term (current) use of insulin: Secondary | ICD-10-CM | POA: Diagnosis not present

## 2017-04-13 DIAGNOSIS — Z87891 Personal history of nicotine dependence: Secondary | ICD-10-CM | POA: Insufficient documentation

## 2017-04-13 DIAGNOSIS — M5442 Lumbago with sciatica, left side: Secondary | ICD-10-CM | POA: Insufficient documentation

## 2017-04-13 DIAGNOSIS — E119 Type 2 diabetes mellitus without complications: Secondary | ICD-10-CM | POA: Diagnosis not present

## 2017-04-13 DIAGNOSIS — M545 Low back pain: Secondary | ICD-10-CM | POA: Diagnosis present

## 2017-04-13 DIAGNOSIS — Z79899 Other long term (current) drug therapy: Secondary | ICD-10-CM | POA: Insufficient documentation

## 2017-04-13 DIAGNOSIS — M48062 Spinal stenosis, lumbar region with neurogenic claudication: Secondary | ICD-10-CM | POA: Diagnosis not present

## 2017-04-13 DIAGNOSIS — R600 Localized edema: Secondary | ICD-10-CM

## 2017-04-13 MED ORDER — MELOXICAM 7.5 MG PO TABS: 7.5000 mg | ORAL_TABLET | Freq: Every day | ORAL | 0 refills | Status: DC

## 2017-04-13 NOTE — ED Triage Notes (Signed)
Pt reports lower back pain and lower abd pain with no urinary symptoms. sts was seen multiple times by PCP for her back issues, no relief. Came to ED because it is difficult ot walk due to pain per pt. Pt more concerned with her back pain then abd pian and wants to be seen for her back , she sts. deneis dysuria nor hematuria.

## 2017-04-13 NOTE — ED Provider Notes (Signed)
[facility] DEPT Provider Note   CSN: [id number] Arrival date & time: 04/13/17  1059  By signing my name below, I, Theresia Bough, attest that this documentation has been prepared under the direction and in the presence of non-physician practitioner, Wyn Quaker, PA-C. Electronically Signed: Theresia Bough, ED Scribe. 04/13/17. 12:38 PM.  History   Chief Complaint Chief Complaint  Patient presents with  . Back Pain    lower   The history is provided by the patient. No language interpreter was used.   HPI Comments: [name] is a 51 y.o. female with a PMHx of DM, who presents to the Emergency Department complaining of constant, gradual onset lower back pain onset 4 days ago. Pt has a hx of chronic back pain for the past 8 months, this feels exactly like her normal pain in character, just worse in severity. Pt has been seen by PCP, a back surgeon and a chiropractor, all of which who could not make her pain better. She notes her pain became worse 4 days ago after her last chiropractor session. Pt has tried ice and hydrocodone 5mg  q8h PTA with minimal relief. Pt states pain is exacerbated by ambulation. Pt reports associated mild swelling in her bilateral legs and feet, mild chronic abdominal discomfort that is unchanged. Pt denies weakness, SOB, dysuria or any other complaints at this time.  Past Medical History:  Diagnosis Date  . Acute bronchitis 07/08/2011  . COMMON MIGRAINE 10/07/2010  . Decreased visual acuity 11/10/2016  . Depression 12/18/2012  . Diabetes mellitus type II, uncontrolled ([place]) 10/07/2010   Qualifier: Diagnosis of  By: Charlett Blake MD, [name]    . Diabetic foot infection ([place]) 08/26/2016  . Disturbance of skin sensation 10/07/2010  . HEART MURMUR, HX OF 10/07/2010  . Hyperlipidemia 12/06/2010  . Hypertension   . Lipoma of abdominal wall 10/05/2016  . NEPHROLITHIASIS, HX OF 10/07/2010  . Overweight(278.02) 12/06/2010  . PERIPHERAL NEUROPATHY, FEET 10/07/2010  .  PVD (peripheral vascular disease) ([place]) 01/21/2012  . RESTLESS LEG SYNDROME 10/25/2010  . Stroke [facility]) 2014, 2017   most recently in 2/17 - intracerebral hemorrhage  . Weakness of right side of body 04/19/2013    Patient Active Problem List   Diagnosis Date Noted  . Otitis media 11/10/2016  . Decreased visual acuity 11/10/2016  . Spinal stenosis, lumbar region with neurogenic claudication 10/22/2016  . Other spondylosis with radiculopathy, lumbar region 10/13/2016  . Lipoma of abdominal wall 10/05/2016  . Acute osteomyelitis of toe, left ([place]) 09/05/2016  . Oral thrush 09/04/2016  . Moderate malnutrition ([place]) 09/04/2016  . Anemia of chronic disease 09/04/2016  . Diabetic foot infection ([place]) 08/26/2016  . Diabetic polyneuropathy associated with diabetes mellitus due to underlying condition ([place]) 05/07/2016  . Orthostatic hypotension 05/07/2016  . Cerebrovascular disease 04/27/2016  . ICH (intracerebral hemorrhage) ([place]) 03/30/2016  . Nonhealing nonsurgical wound 03/06/2016  . Visit for preventive health examination 05/21/2015  . Breast cancer screening 05/21/2015  . Arm lesion 07/02/2014  . Pain in joint, ankle and foot 04/28/2014  . Pain in limb 01/13/2014  . Neuropathic pain of both legs 01/13/2014  . Hip pain 01/12/2014  . Allergic rhinitis 03/10/2013  . Back pain 03/08/2013  . Depression 12/18/2012  . PVD (peripheral vascular disease) ([place]) 01/21/2012  . Wound of left leg 12/22/2011  . Hyperlipidemia 12/06/2010  . Overweight(278.02) 12/06/2010  . RESTLESS LEG SYNDROME 10/25/2010  . Migraine without aura 10/07/2010  . Hereditary and idiopathic peripheral neuropathy 10/07/2010  . Essential hypertension,  benign 10/07/2010  . DISTURBANCE OF SKIN SENSATION 10/07/2010  . HEART MURMUR, HX OF 10/07/2010  . NEPHROLITHIASIS, HX OF 10/07/2010    Past Surgical History:  Procedure Laterality Date  . AMPUTATION TOE Left 09/09/2016   Procedure: AMPUTATION OF LEFT GREAT TOE;   Surgeon: Milly Jakob, MD;  Location: [facility];  Service: Orthopedics;  Laterality: Left;  . WISDOM TOOTH EXTRACTION      OB History    No data available       Home Medications    Prior to Admission medications   Medication Sig Start Date End Date Taking? Authorizing Provider  aspirin EC 81 MG tablet Take 81 mg by mouth daily.    [provider]  azelastine (ASTELIN) 0.1 % nasal spray Place 2 sprays into both nostrils 2 (two) times daily. Use in each nostril as directed 11/19/16   Saguier, Percell Miller, PA-C  escitalopram (LEXAPRO) 10 MG tablet Take 1 tablet (10 mg total) by mouth daily. 11/10/16   Mosie Lukes, MD  fluticasone (FLONASE) 50 MCG/ACT nasal spray Place 2 sprays into both nostrils daily. 11/19/16   Saguier, Percell Miller, PA-C  gabapentin (NEURONTIN) 100 MG capsule Take 3 capsules (300 mg total) by mouth 3 (three) times daily. 03/02/17   Mosie Lukes, MD  HYDROcodone-acetaminophen (NORCO/VICODIN) 5-325 MG tablet Take 1 tablet by mouth every 6 (six) hours as needed for moderate pain. 03/02/17   Mosie Lukes, MD  Insulin Pen Needle 32G X 4 MM MISC Use as directed for insulin.DX E11.65 03/03/16   Mosie Lukes, MD  Lancets (FREESTYLE) lancets DX- 250.02  Pts machine is Freestyle Freedom Lite 04/20/13   Mosie Lukes, MD  LANTUS SOLOSTAR 100 UNIT/ML Solostar Pen INJECT 34 UNITS IN AM & 32 UNITS IN PM. INCREASE BY 2 UNITS EVERY 3 DAYS AS LONG AS ABOVE 100 09/29/16   [provider]  LANTUS SOLOSTAR 100 UNIT/ML Solostar Pen INJECT 34 UNITS IN AM & 32 UNITS IN PM. INCREASE BY 2 UNITS EVERY 3 DAYS AS LONG AS ABOVE 100 12/29/16   Mosie Lukes, MD  lisinopril (PRINIVIL,ZESTRIL) 20 MG tablet Take 1 tablet (20 mg total) by mouth 2 (two) times daily. 11/10/16   Mosie Lukes, MD  meloxicam (MOBIC) 7.5 MG tablet Take 1 tablet (7.5 mg total) by mouth daily. 04/13/17   Lorin Glass, PA-C  metFORMIN (GLUCOPHAGE) 1000 MG tablet TAKE 1 TABLET TWICE DAILY AND  1/2 TABLET AT NOON 04/01/16   Mosie Lukes, MD  ONE TOUCH ULTRA TEST test strip USE ONCE DAILY TO CHECK BLOOD SUGAR. DX E11.9 03/13/17   Mosie Lukes, MD  predniSONE (DELTASONE) 10 MG tablet Take 1 tablet (10 mg total) by mouth daily with breakfast. 10/13/16   [name], MD  simvastatin (ZOCOR) 10 MG tablet Take 1 tablet (10 mg total) by mouth daily. 10/14/16   Mosie Lukes, MD  Vitamin D, Ergocalciferol, (DRISDOL) 50000 units CAPS capsule Take 1 capsule (50,000 Units total) by mouth every 7 (seven) days. 10/22/16   Lyndal Pulley, DO    Family History Family History  Problem Relation Age of Onset  . Arthritis Mother   . Stroke Brother        previous smoker  . Alcohol abuse Brother        in remission  . Leukemia Brother   . Diabetes Paternal Grandmother   . Healthy Son     Social History Social History  Substance  Use Topics  . Smoking status: Former Smoker    Packs/day: 0.50    Quit date: 09/08/2005  . Smokeless tobacco: Never Used  . Alcohol use No     Allergies   Lyrica [pregabalin]; Morphine and related; and Tramadol   Review of Systems Review of Systems  Respiratory: Negative for shortness of breath.   Cardiovascular: Positive for leg swelling.  Genitourinary: Negative for dysuria and enuresis.  Musculoskeletal: Positive for back pain and myalgias.  Neurological: Negative for weakness and numbness.     Physical Exam Updated Vital Signs BP (!) 162/95   Pulse 85   Temp 98.4 F (36.9 C) (Oral)   Resp 18   LMP 02/12/2011 (Exact Date)   SpO2 98%   Physical Exam  Constitutional: She appears well-developed and well-nourished. No distress.  HENT:  Head: Normocephalic and atraumatic.  Eyes: No scleral icterus.  Neck: Normal range of motion. Neck supple.  Cardiovascular: Normal rate.   Pulmonary/Chest: Effort normal. No respiratory distress.  Abdominal: Soft. She exhibits no distension. There is no tenderness.  Musculoskeletal: Normal range of motion.   No C/T/L midline tenderness.  Mild left buttock muscle TTP which  Both re-creates and  Exacerbates her pain.  She is able to stand on both her heels and her toes, however reports that it increases her pain.  Strength 5/5 bilateral lower extremities through knee, hip, and ankle flexion/extension, however patient states is painful.  +1 pitting edema to bilateral lower legs.    Neurological: No sensory deficit. She exhibits normal muscle tone.  Skin: Skin is warm and dry.  Bilateral lower legs exhibit changes in skin pigmentation/coloration consistent with chronic vascular insufficiency.  Psychiatric: She has a normal mood and affect. Her behavior is normal.  Nursing note and vitals reviewed.    ED Treatments / Results  DIAGNOSTIC STUDIES: Oxygen Saturation is 98% on RA, normal by my interpretation.   COORDINATION OF CARE: 12:31 PM-Discussed next steps with pt including speaking with Dr. Jeneen Rinks about a possible XR. Pt verbalized understanding and is agreeable with the plan.   Labs (all labs ordered are listed, but only abnormal results are displayed) Labs Reviewed - No data to display  EKG  EKG Interpretation None       Radiology No results found.  Procedures Procedures (including critical care time)  Medications Ordered in ED Medications - No data to display   Initial Impression / Assessment and Plan / ED Course  I have reviewed the triage vital signs and the nursing notes.  Pertinent labs & imaging results that were available during my care of the patient were reviewed by me and considered in my medical decision making (see chart for details).    Patient with back pain.  No neurological deficits and normal neuro exam.  Patient can walk but states is painful.  No loss of bowel or bladder control.  No concern for cauda equina.  No fever, night sweats, weight loss, h/o cancer, IVDU.  RICE protocol and pain medicine indicated and discussed with patient. She was instructed  to continue taking her home Norco, instructed that if her pain is not managed she may take (2) 5 mg pills however she needs to use caution with this and make sure she wait at least 8 hours in between doses.  She was given Rx for meloxicam. She was instructed not to take other NSAID's while using Mobic. She was given return precautions, states her understanding. States she will follow up with both her  PCP and her spine doctor regarding her ED visit today.  As patient reports she has been laying on the couch and not walking or moving I suspect that the chronic edema in her legs, has been worsened by lack of mobility.     At this time there does not appear to be any evidence of an acute emergency medical condition and the patient appears stable for discharge with appropriate outpatient follow up.Diagnosis was discussed with patient who verbalizes understanding and is agreeable to discharge. Pt case discussed with Dr. Jeneen Rinks who agrees with my plan.    Final Clinical Impressions(s) / ED Diagnoses   Final diagnoses:  Acute low back pain with left-sided sciatica, unspecified back pain laterality  Spinal stenosis, lumbar region with neurogenic claudication  Bilateral edema of lower extremity    New Prescriptions Discharge Medication List as of 04/13/2017  1:09 PM    START taking these medications   Details  meloxicam (MOBIC) 7.5 MG tablet Take 1 tablet (7.5 mg total) by mouth daily., Starting Mon 04/13/2017, Print       I personally performed the services described in this documentation, which was scribed in my presence. The recorded information has been reviewed and is accurate.      Lorin Glass, PA-C 04/13/17 1351    Tanna Furry, MD 04/16/17 1034

## 2017-04-13 NOTE — Discharge Instructions (Signed)
You are being given a rx for mobic.  Please do not take other NSAIDS (ibuprofen, aleve) while taking this.  You may continue to take norco and tylenol as needed.  Norco contains tylenol.  Do not take over 3,000 mg of total tylenol in a 24 hour period.

## 2017-04-13 NOTE — ED Notes (Signed)
Bed: WTR7 Expected date:  Expected time:  Means of arrival:  Comments: 

## 2017-04-15 ENCOUNTER — Ambulatory Visit: Payer: BLUE CROSS/BLUE SHIELD | Admitting: Medical

## 2017-04-16 ENCOUNTER — Other Ambulatory Visit: Payer: Self-pay | Admitting: Neurosurgery

## 2017-04-20 ENCOUNTER — Telehealth: Payer: Self-pay | Admitting: Family Medicine

## 2017-04-20 NOTE — Telephone Encounter (Signed)
Can we refill for patient.  Her paperwork said to get medication from provider who originally wrote it.

## 2017-04-20 NOTE — Telephone Encounter (Signed)
Caller name: Lataunya Ruud Relationship to patient: self Can be reached: [phone]  Reason for call: Pt requesting refill on Hydrocodone. She was in ER  04/13/17. They told her to take 2 pills at a time for pain. Pt requesting refill prior to surgery. She is scheduled for surgery 05/15/17 with Dr. Kathyrn Sheriff [facility]). Please call to notify pt.

## 2017-04-20 NOTE — Telephone Encounter (Signed)
I am willing to increase her strength til surgery. But change her to the [place] 10/325, 1/2 to 1 tab po q 6 hours prn pain, disp #120. Make sure contract and uds are UTD

## 2017-04-21 MED ORDER — HYDROCODONE-ACETAMINOPHEN 10-325 MG PO TABS: 1.0000 | ORAL_TABLET | Freq: Four times a day (QID) | ORAL | 0 refills | Status: DC | PRN

## 2017-04-21 NOTE — Telephone Encounter (Signed)
Left message on machine that rx is ready for pickup.

## 2017-04-21 NOTE — Telephone Encounter (Signed)
Contract signed and uds given 03/02/17.  Sent Amy a message to send results of UDS, which is not in chart.  Awaiting signature of provider.

## 2017-04-22 ENCOUNTER — Telehealth: Payer: Self-pay | Admitting: *Deleted

## 2017-04-22 NOTE — Telephone Encounter (Signed)
Received lab results report from [facility]; forwarded to provider/SLS 08/15

## 2017-04-30 ENCOUNTER — Telehealth: Payer: Self-pay | Admitting: Family Medicine

## 2017-04-30 MED ORDER — MELOXICAM 7.5 MG PO TABS: 7.5000 mg | ORAL_TABLET | Freq: Every day | ORAL | 0 refills | Status: DC

## 2017-04-30 NOTE — Telephone Encounter (Signed)
°  Relation to IR:JJOA Call back [phone] Pharmacy: [id number] - [place], [place] - [name] [phone] (Phone) [phone] (Fax)     Reason for call:  Patient scheduled ED follow up with PCP Monday 05/04/17 and would like PCP to refill meloxicam (MOBIC) 7.5 MG tablet due to both feet being swollen, please advise

## 2017-04-30 NOTE — Telephone Encounter (Signed)
rx sent to pharmacy.  PC

## 2017-05-04 ENCOUNTER — Encounter: Payer: Self-pay | Admitting: Family Medicine

## 2017-05-04 ENCOUNTER — Ambulatory Visit (INDEPENDENT_AMBULATORY_CARE_PROVIDER_SITE_OTHER): Payer: BLUE CROSS/BLUE SHIELD | Admitting: Family Medicine

## 2017-05-04 DIAGNOSIS — E782 Mixed hyperlipidemia: Secondary | ICD-10-CM

## 2017-05-04 DIAGNOSIS — E0842 Diabetes mellitus due to underlying condition with diabetic polyneuropathy: Secondary | ICD-10-CM | POA: Diagnosis not present

## 2017-05-04 DIAGNOSIS — M545 Low back pain, unspecified: Secondary | ICD-10-CM

## 2017-05-04 DIAGNOSIS — I679 Cerebrovascular disease, unspecified: Secondary | ICD-10-CM

## 2017-05-04 DIAGNOSIS — I10 Essential (primary) hypertension: Secondary | ICD-10-CM | POA: Diagnosis not present

## 2017-05-04 MED ORDER — FUROSEMIDE 20 MG PO TABS: ORAL_TABLET | ORAL | 1 refills | Status: DC

## 2017-05-04 MED ORDER — HYDROCODONE-ACETAMINOPHEN 10-325 MG PO TABS: 1.0000 | ORAL_TABLET | Freq: Four times a day (QID) | ORAL | 0 refills | Status: DC | PRN

## 2017-05-04 NOTE — Assessment & Plan Note (Signed)
Well controlled, no changes to meds. Encouraged heart healthy diet such as the DASH diet and exercise as tolerated.  °

## 2017-05-04 NOTE — Progress Notes (Signed)
Pre visit review using our clinic review tool, if applicable. No additional management support is needed unless otherwise documented below in the visit note. 

## 2017-05-04 NOTE — Assessment & Plan Note (Signed)
[id number] unacceptable, minimize simple carbs. Increase exercise as tolerated. Continue current meds since she is in so much pain and she is going to go to surgery to correct her back due to the increased pain

## 2017-05-04 NOTE — Patient Instructions (Signed)

## 2017-05-05 NOTE — Assessment & Plan Note (Signed)
No recent exacerbation 

## 2017-05-05 NOTE — Assessment & Plan Note (Signed)
Pain is severe and constant she is preparing for surgery with Dr Sherwood Gambler due to her level of pain. Encouraged moist heat and gentle stretching as tolerated. May try NSAIDs and prescription meds as directed and report if symptoms worsen or seek immediate care

## 2017-05-05 NOTE — Assessment & Plan Note (Signed)
Encouraged heart healthy diet, increase exercise, avoid trans fats, consider a krill oil cap daily 

## 2017-05-05 NOTE — Progress Notes (Signed)
[name], female   DOB: 1966/01/07, 51 y.o.   MRN: [id number]   Subjective:    [name], female    DOB: 1965-11-23, 50 y.o.   MRN: [id number]  Chief Complaint  Patient presents with  . Follow-up    ED on 8/6    HPI Patient is in today for follow up on back pain, diabetes and more. She struggles with sever back pain daily. She is ready to pursue surgery with the neurosurgeon Dr Sherwood Gambler due to her level of pain and debility. No recent fall or injury. Denies CP/palp/SOB/HA/congestion/fevers/GI or GU c/o. Taking meds as prescribed. Continues to struggle with anxiety and decreased sensation of b/l lower extremities and fatigue  Past Medical History:  Diagnosis Date  . Acute bronchitis 07/08/2011  . COMMON MIGRAINE 10/07/2010  . Decreased visual acuity 11/10/2016  . Depression 12/18/2012  . Diabetes mellitus type II, uncontrolled ([place]) 10/07/2010   Qualifier: Diagnosis of  By: Charlett Blake MD, [name]    . Diabetic foot infection ([place]) 08/26/2016  . Disturbance of skin sensation 10/07/2010  . HEART MURMUR, HX OF 10/07/2010  . Hyperlipidemia 12/06/2010  . Hypertension   . Lipoma of abdominal wall 10/05/2016  . NEPHROLITHIASIS, HX OF 10/07/2010  . Overweight(278.02) 12/06/2010  . PERIPHERAL NEUROPATHY, FEET 10/07/2010  . PVD (peripheral vascular disease) ([place]) 01/21/2012  . RESTLESS LEG SYNDROME 10/25/2010  . Stroke [facility]) 2014, 2017   most recently in 2/17 - intracerebral hemorrhage  . Weakness of right side of body 04/19/2013    Past Surgical History:  Procedure Laterality Date  . AMPUTATION TOE Left 09/09/2016   Procedure: AMPUTATION OF LEFT GREAT TOE;  Surgeon: Milly Jakob, MD;  Location: [place];  Service: Orthopedics;  Laterality: Left;  . WISDOM TOOTH EXTRACTION      Family History  Problem Relation Age of Onset  . Arthritis Mother   . Stroke Brother        previous smoker  . Alcohol abuse Brother        in remission  . Leukemia  Brother   . Diabetes Paternal Grandmother   . Healthy Son     Social History   Social History  . Marital status: Single    Spouse name: N/A  . Number of children: N/A  . Years of education: N/A   Occupational History  . machine operator    Social History Main Topics  . Smoking status: Former Smoker    Packs/day: 0.50    Quit date: 09/08/2005  . Smokeless tobacco: Never Used  . Alcohol use No  . Drug use: No  . Sexual activity: Yes    Partners: Male    Birth control/ protection: None   Other Topics Concern  . Not on file   Social History Narrative   Lives with husband in a two story home.  Has one child.     Works as a Glass blower/designer.     Education: high school.    Outpatient Medications Prior to Visit  Medication Sig Dispense Refill  . aspirin EC 81 MG tablet Take 81 mg by mouth daily.    Marland Kitchen azelastine (ASTELIN) 0.1 % nasal spray Place 2 sprays into both nostrils 2 (two) times daily. Use in each nostril as directed 30 mL 3  . escitalopram (LEXAPRO) 10 MG tablet Take 1 tablet (10 mg total) by mouth daily. 30 tablet 2  . fluticasone (FLONASE) 50 MCG/ACT nasal spray Place 2 sprays into  both nostrils daily. 16 g 1  . gabapentin (NEURONTIN) 100 MG capsule Take 3 capsules (300 mg total) by mouth 3 (three) times daily. 90 capsule 3  . Insulin Pen Needle 32G X 4 MM MISC Use as directed for insulin.DX E11.65 100 each 6  . Lancets (FREESTYLE) lancets DX- 250.02  Pts machine is Freestyle Freedom Lite 100 each 5  . LANTUS SOLOSTAR 100 UNIT/ML Solostar Pen INJECT 34 UNITS IN AM & 32 UNITS IN PM. INCREASE BY 2 UNITS EVERY 3 DAYS AS LONG AS ABOVE 100  4  . lisinopril (PRINIVIL,ZESTRIL) 20 MG tablet Take 1 tablet (20 mg total) by mouth 2 (two) times daily. 90 tablet 2  . meloxicam (MOBIC) 7.5 MG tablet Take 1 tablet (7.5 mg total) by mouth daily. 20 tablet 0  . metFORMIN (GLUCOPHAGE) 1000 MG tablet TAKE 1 TABLET TWICE DAILY AND 1/2 TABLET AT NOON 75 tablet 1  . ONE TOUCH ULTRA TEST  test strip USE ONCE DAILY TO CHECK BLOOD SUGAR. DX E11.9 100 each 2  . predniSONE (DELTASONE) 10 MG tablet Take 1 tablet (10 mg total) by mouth daily with breakfast. 30 tablet 1  . simvastatin (ZOCOR) 10 MG tablet Take 1 tablet (10 mg total) by mouth daily. 30 tablet 3  . Vitamin D, Ergocalciferol, (DRISDOL) 50000 units CAPS capsule Take 1 capsule (50,000 Units total) by mouth every 7 (seven) days. 12 capsule 0  . HYDROcodone-acetaminophen (NORCO) 10-325 MG tablet Take 1-2 tablets by mouth every 6 (six) hours as needed (pain). 120 tablet 0  . LANTUS SOLOSTAR 100 UNIT/ML Solostar Pen INJECT 34 UNITS IN AM & 32 UNITS IN PM. INCREASE BY 2 UNITS EVERY 3 DAYS AS LONG AS ABOVE 100 15 mL 4   No facility-administered medications prior to visit.     Allergies  Allergen Reactions  . Lyrica [Pregabalin] Other (See Comments)    MADE PATIENT VERY EMOTIONAL AND WOULD CRY EASILY  . Morphine And Related Nausea And Vomiting  . Tramadol Nausea And Vomiting    Pt cant tolerate this pain med.     Review of Systems  Constitutional: Positive for malaise/fatigue. Negative for fever.  HENT: Negative for congestion.   Eyes: Negative for blurred vision.  Respiratory: Negative for shortness of breath.   Cardiovascular: Negative for chest pain, palpitations and leg swelling.  Gastrointestinal: Negative for abdominal pain, blood in stool and nausea.  Genitourinary: Negative for dysuria and frequency.  Musculoskeletal: Positive for back pain. Negative for falls.  Skin: Negative for rash.  Neurological: Positive for sensory change. Negative for dizziness, loss of consciousness and headaches.  Endo/Heme/Allergies: Negative for environmental allergies.  Psychiatric/Behavioral: Negative for depression. The patient is nervous/anxious.        Objective:    Physical Exam  Constitutional: She is oriented to person, place, and time. She appears well-developed and well-nourished. No distress.  HENT:  Head:  Normocephalic and atraumatic.  Nose: Nose normal.  Eyes: Right eye exhibits no discharge. Left eye exhibits no discharge.  Neck: Normal range of motion. Neck supple.  Cardiovascular: Normal rate and regular rhythm.   No murmur heard. Pulmonary/Chest: Effort normal and breath sounds normal.  Abdominal: Soft. Bowel sounds are normal. There is no tenderness.  Musculoskeletal: She exhibits no edema.  Neurological: She is alert and oriented to person, place, and time.  Skin: Skin is warm and dry.  Psychiatric: She has a normal mood and affect.  Nursing note and vitals reviewed.   BP 130/68 (BP Location: Left  Arm, Patient Position: Sitting, Cuff Size: Small)   Pulse 77   Temp 98 F (36.7 C) (Oral)   Resp 14   Ht 5\' 2"  (1.575 m)   Wt 161 lb 6 oz (73.2 kg)   LMP 02/12/2011 (Exact Date)   SpO2 98%   BMI 29.52 kg/m  Wt Readings from Last 3 Encounters:  05/04/17 161 lb 6 oz (73.2 kg)  03/27/17 159 lb 3.2 oz (72.2 kg)  03/02/17 154 lb 12.8 oz (70.2 kg)     Lab Results  Component Value Date   WBC 7.3 03/02/2017   HGB 11.5 (L) 03/02/2017   HCT 34.8 (L) 03/02/2017   PLT 343.0 03/02/2017   GLUCOSE 449 (H) 03/02/2017   CHOL 197 03/02/2017   TRIG 220.0 (H) 03/02/2017   HDL 32.80 (L) 03/02/2017   LDLDIRECT 133.0 03/02/2017   LDLCALC 150 (H) 10/13/2016   ALT 15 03/02/2017   AST 12 03/02/2017   NA 137 03/02/2017   K 4.2 03/02/2017   CL 100 03/02/2017   CREATININE 1.00 03/02/2017   BUN 27 (H) 03/02/2017   CO2 28 03/02/2017   TSH 3.05 03/02/2017   INR 1.07 03/30/2016   HGBA1C 12.0 (H) 03/02/2017   MICROALBUR 1.1 12/22/2011    Lab Results  Component Value Date   TSH 3.05 03/02/2017   Lab Results  Component Value Date   WBC 7.3 03/02/2017   HGB 11.5 (L) 03/02/2017   HCT 34.8 (L) 03/02/2017   MCV 84.3 03/02/2017   PLT 343.0 03/02/2017   Lab Results  Component Value Date   NA 137 03/02/2017   K 4.2 03/02/2017   CO2 28 03/02/2017   GLUCOSE 449 (H) 03/02/2017   BUN 27  (H) 03/02/2017   CREATININE 1.00 03/02/2017   BILITOT 0.4 03/02/2017   ALKPHOS 104 03/02/2017   AST 12 03/02/2017   ALT 15 03/02/2017   PROT 6.8 03/02/2017   ALBUMIN 3.6 03/02/2017   CALCIUM 9.4 03/02/2017   ANIONGAP 7 09/06/2016   GFR 61.59 03/02/2017   Lab Results  Component Value Date   CHOL 197 03/02/2017   Lab Results  Component Value Date   HDL 32.80 (L) 03/02/2017   Lab Results  Component Value Date   LDLCALC 150 (H) 10/13/2016   Lab Results  Component Value Date   TRIG 220.0 (H) 03/02/2017   Lab Results  Component Value Date   CHOLHDL 6 03/02/2017   Lab Results  Component Value Date   HGBA1C 12.0 (H) 03/02/2017       Assessment & Plan:   Problem List Items Addressed This Visit    Essential hypertension, benign    Well controlled, no changes to meds. Encouraged heart healthy diet such as the DASH diet and exercise as tolerated.       Relevant Medications   furosemide (LASIX) 20 MG tablet   Hyperlipidemia    Encouraged heart healthy diet, increase exercise, avoid trans fats, consider a krill oil cap daily      Relevant Medications   furosemide (LASIX) 20 MG tablet   Back pain    Pain is severe and constant she is preparing for surgery with Dr Sherwood Gambler due to her level of pain. Encouraged moist heat and gentle stretching as tolerated. May try NSAIDs and prescription meds as directed and report if symptoms worsen or seek immediate care      Relevant Medications   HYDROcodone-acetaminophen (NORCO) 10-325 MG tablet   Cerebrovascular disease    No recent exacerbation  Relevant Medications   furosemide (LASIX) 20 MG tablet   Diabetic polyneuropathy associated with diabetes mellitus due to underlying condition (HCC)    hgba1c unacceptable, minimize simple carbs. Increase exercise as tolerated. Continue current meds since she is in so much pain and she is going to go to surgery to correct her back due to the increased pain         I am having  Ms. Savin start on furosemide. I am also having her maintain her freestyle, Insulin Pen Needle, metFORMIN, aspirin EC, LANTUS SOLOSTAR, predniSONE, simvastatin, Vitamin D (Ergocalciferol), escitalopram, lisinopril, fluticasone, azelastine, gabapentin, ONE TOUCH ULTRA TEST, meloxicam, and HYDROcodone-acetaminophen.  Meds ordered this encounter  Medications  . furosemide (LASIX) 20 MG tablet    Sig: 2 tabs po daily x 5 days then 1 tab po daily x 5 dasy then 1 daily as needed for pedal edema and weight gain greater than 3# in 24 hours.    Dispense:  35 tablet    Refill:  1  . HYDROcodone-acetaminophen (NORCO) 10-325 MG tablet    Sig: Take 1-2 tablets by mouth every 6 (six) hours as needed (pain).    Dispense:  180 tablet    Refill:  0    Penni Homans, MD

## 2017-05-12 ENCOUNTER — Encounter (HOSPITAL_COMMUNITY): Payer: Self-pay

## 2017-05-12 ENCOUNTER — Encounter (HOSPITAL_COMMUNITY)
Admission: RE | Admit: 2017-05-12 | Discharge: 2017-05-12 | Disposition: A | Discharge status: Home / Self Care | Payer: BLUE CROSS/BLUE SHIELD | Source: Ambulatory Visit | Attending: Neurosurgery | Admitting: Neurosurgery

## 2017-05-12 DIAGNOSIS — E119 Type 2 diabetes mellitus without complications: Secondary | ICD-10-CM | POA: Diagnosis not present

## 2017-05-12 DIAGNOSIS — Z8673 Personal history of transient ischemic attack (TIA), and cerebral infarction without residual deficits: Secondary | ICD-10-CM | POA: Diagnosis not present

## 2017-05-12 DIAGNOSIS — Z794 Long term (current) use of insulin: Secondary | ICD-10-CM | POA: Insufficient documentation

## 2017-05-12 DIAGNOSIS — I10 Essential (primary) hypertension: Secondary | ICD-10-CM | POA: Diagnosis not present

## 2017-05-12 DIAGNOSIS — Z89412 Acquired absence of left great toe: Secondary | ICD-10-CM | POA: Diagnosis not present

## 2017-05-12 DIAGNOSIS — Z7982 Long term (current) use of aspirin: Secondary | ICD-10-CM | POA: Diagnosis not present

## 2017-05-12 DIAGNOSIS — Z87891 Personal history of nicotine dependence: Secondary | ICD-10-CM | POA: Diagnosis not present

## 2017-05-12 DIAGNOSIS — Z01818 Encounter for other preprocedural examination: Secondary | ICD-10-CM | POA: Diagnosis not present

## 2017-05-12 HISTORY — DX: Personal history of urinary calculi: Z87.442

## 2017-05-12 HISTORY — DX: Anxiety disorder, unspecified: F41.9

## 2017-05-12 LAB — TYPE AND SCREEN
ABO/RH(D): O POS
Antibody Screen: NEGATIVE

## 2017-05-12 LAB — SURGICAL PCR SCREEN
MRSA, PCR: NEGATIVE
Staphylococcus aureus: NEGATIVE

## 2017-05-12 LAB — CBC
HCT: 34.8 % — ABNORMAL LOW (ref 36.0–46.0)
Hemoglobin: 11.2 g/dL — ABNORMAL LOW (ref 12.0–15.0)
MCH: 27.4 pg (ref 26.0–34.0)
MCHC: 32.2 g/dL (ref 30.0–36.0)
MCV: 85.1 fL (ref 78.0–100.0)
Platelets: 365 10*3/uL (ref 150–400)
RBC: 4.09 MIL/uL (ref 3.87–5.11)
RDW: 13.8 % (ref 11.5–15.5)
WBC: 7 10*3/uL (ref 4.0–10.5)

## 2017-05-12 LAB — BASIC METABOLIC PANEL
Anion gap: 7 (ref 5–15)
BUN: 19 mg/dL (ref 6–20)
CO2: 27 mmol/L (ref 22–32)
Calcium: 9.3 mg/dL (ref 8.9–10.3)
Chloride: 106 mmol/L (ref 101–111)
Creatinine, Ser: 0.84 mg/dL (ref 0.44–1.00)
GFR calc Af Amer: >60 mL/min (ref >60)
GFR calc non Af Amer: >60 mL/min (ref >60)
Glucose, Bld: 201 mg/dL — ABNORMAL HIGH (ref 65–99)
Potassium: 3.9 mmol/L (ref 3.5–5.1)
Sodium: 140 mmol/L (ref 135–145)

## 2017-05-12 LAB — HEMOGLOBIN A1C
Hgb A1c MFr Bld: 9.7 % — ABNORMAL HIGH (ref 4.8–5.6)
Mean Plasma Glucose: 231.69 mg/dL

## 2017-05-12 LAB — ABO/RH: ABO/RH(D): O POS

## 2017-05-12 NOTE — Pre-Procedure Instructions (Addendum)
[name]  05/12/2017      [id number] - [place], [place] - [name] [place] [address] Phone: [phone] Fax: [place], [place] [address] [place] [address] Phone: [phone] Fax: [phone]    Your procedure is scheduled on Friday, May 15, 2017.  Report to [facility] Admitting at 05:30 A.M.  Call this number if you have problems the morning of surgery:  [phone]   Remember:  Do not eat food or drink liquids after midnight.  Continue all other medications as directed by your physician except follow these instructions about your medications   Take these medicines the morning of surgery with A SIP OF WATER:  Gabapentin (Neurontin) Hydrocodone (Norco) - if needed Eye drop  7 days prior to surgery STOP taking any Aspirin, Aleve, Naproxen, Ibuprofen, Motrin, Advil, Goody's, BC's, all herbal medications, fish oil, and all vitamins, and Meloxicam (Mobic)    How to Manage Your Diabetes Before and After Surgery  Why is it important to control my blood sugar before and after surgery? . Improving blood sugar levels before and after surgery helps healing and can limit problems. . A way of improving blood sugar control is eating a healthy diet by: o  Eating less sugar and carbohydrates o  Increasing activity/exercise o  Talking with your doctor about reaching your blood sugar goals . High blood sugars (greater than 180 mg/dL) can raise your risk of infections and slow your recovery, so you will need to focus on controlling your diabetes during the weeks before surgery. . Make sure that the doctor who takes care of your diabetes knows about your planned surgery including the date and location.  How do I manage my blood sugar before surgery? . Check your blood sugar at least 4  times a day, starting 2 days before surgery, to make sure that the level is not too high or low. o Check your blood sugar the morning of your surgery when you wake up and every 2 hours until you get to the Short Stay unit. . If your blood sugar is less than 70 mg/dL, you will need to treat for low blood sugar: o Do not take insulin. o Treat a low blood sugar (less than 70 mg/dL) with  cup of clear juice (cranberry or apple), 4 glucose tablets, OR glucose gel. o Recheck blood sugar in 15 minutes after treatment (to make sure it is greater than 70 mg/dL). If your blood sugar is not greater than 70 mg/dL on recheck, call [phone] for further instructions. . Report your blood sugar to the short stay nurse when you get to Short Stay.  . If you are admitted to the hospital after surgery: o Your blood sugar will be checked by the staff and you will probably be given insulin after surgery (instead of oral diabetes medicines) to make sure you have good blood sugar levels. o The goal for blood sugar control after surgery is 80-180 mg/dL.   WHAT DO I DO ABOUT MY DIABETES MEDICATION?   Marland Kitchen Do not take oral diabetes medicines (Metformin - glucophage) the morning of surgery.  . THE NIGHT BEFORE SURGERY, take 25 units of Lantus insulin.       . THE MORNING OF SURGERY, take 25 units of Lantus insulin.  Marland Kitchen  The day of surgery, do not take other diabetes injectables, including Byetta (exenatide), Bydureon (exenatide ER), Victoza (liraglutide), or Trulicity (dulaglutide).  . If your CBG is greater than 220 mg/dL, you may take  of your sliding scale (correction) dose of insulin.    Do not wear jewelry, make-up or nail polish.  Do not wear lotions, powders, or perfumes, or deodorant.  Do not shave 48 hours prior to surgery.    Do not bring valuables to the hospital.  [facility] is not responsible for any belongings or valuables.  Contacts, dentures or bridgework may not be worn into surgery.  Leave  your suitcase in the car.  After surgery it may be brought to your room.  For patients admitted to the hospital, discharge time will be determined by your treatment team.  Patients discharged the day of surgery will not be allowed to drive home.   Name and phone number of your driver:    Special instructions:   [facility]- Preparing For Surgery  Before surgery, you can play an important role. Because skin is not sterile, your skin needs to be as free of germs as possible. You can reduce the number of germs on your skin by washing with CHG (chlorahexidine gluconate) Soap before surgery.  CHG is an antiseptic cleaner which kills germs and bonds with the skin to continue killing germs even after washing.  Please do not use if you have an allergy to CHG or antibacterial soaps. If your skin becomes reddened/irritated stop using the CHG.  Do not shave (including legs and underarms) for at least 48 hours prior to first CHG shower. It is OK to shave your face.  Please follow these instructions carefully.   1. Shower the NIGHT BEFORE SURGERY and the MORNING OF SURGERY with CHG.   2. If you chose to wash your hair, wash your hair first as usual with your normal shampoo.  3. After you shampoo, rinse your hair and body thoroughly to remove the shampoo.  4. Use CHG as you would any other liquid soap. You can apply CHG directly to the skin and wash gently with a scrungie or a clean washcloth.   5. Apply the CHG Soap to your body ONLY FROM THE NECK DOWN.  Do not use on open wounds or open sores. Avoid contact with your eyes, ears, mouth and genitals (private parts). Wash genitals (private parts) with your normal soap.  6. Wash thoroughly, paying special attention to the area where your surgery will be performed.  7. Thoroughly rinse your body with warm water from the neck down.  8. DO NOT shower/wash with your normal soap after using and rinsing off the CHG Soap.  9. Pat yourself dry with a CLEAN  TOWEL.   10. Wear CLEAN PAJAMAS   11. Place CLEAN SHEETS on your bed the night of your first shower and DO NOT SLEEP WITH PETS.    Day of Surgery: [facility] as stated above Do not apply any deodorants/lotions/powders/perfumes.  Please wear clean clothes to the hospital/surgery center.      Please read over the following fact sheets that you were given. Pain Booklet, Coughing and Deep Breathing, MRSA Information and Surgical Site Infection Prevention

## 2017-05-12 NOTE — Progress Notes (Signed)
PCP - Dr. [name] Cardiologist - patient denies  Chest x-ray - 08/26/2016 EKG - 08/28/2016 Stress Test - 2014 ECHO - 04/01/2016 Cardiac Cath - patient denies  Sleep Study - patient denies   Checks Blood Sugar 1 time a day     Patient denies shortness of breath, fever, cough and chest pain at PAT appointment   Patient verbalized understanding of instructions that were given to them at the PAT appointment. Patient was also instructed that they will need to review over the PAT instructions again at home before surgery.

## 2017-05-13 ENCOUNTER — Encounter (HOSPITAL_COMMUNITY): Payer: Self-pay | Admitting: Emergency Medicine

## 2017-05-13 NOTE — Progress Notes (Signed)
Anesthesia Chart Review:  Pt is a 51 year old female scheduled for L4-5 PLIF on 05/15/2017 with Consuella Lose, MD  - PCP is Penni Homans, MD who is aware of upcoming surgery.   PMH includes:  HTN, DM, stroke (2014; [place] 2017), PVD, hyperlipidemia. Former smoker. BMI 28. S/p L great toe amputation 09/09/16.   Medications include: ASA 81 mg, Lasix, Lantus, lisinopril, metformin  BP (!) 168/90 Comment: taken manually  Pulse 81   Temp 36.7 C   Resp 18   Ht 5' 2.5" (1.588 m)   Wt 156 lb 9.6 oz (71 kg)   LMP 02/12/2011 (Exact Date)   SpO2 98%   BMI 28.19 kg/m   Preoperative labs reviewed.   - HbA1c 9.7, glucose 201.  - HbA1c improved, was 12.0 on 03/02/17.  - I left voicemail for [facility] in Dr. Cleotilde Neer office about uncontrolled DM.   CXR 08/26/16:  1.  Mild cardiomegaly.   2. Low lung volumes.  No focal infiltrate.  EKG 08/26/16: Sinus tachycardia (109 bpm). Borderline T abnormalities, inferior leads  Carotid duplex 04/01/16:  - Mild intimal thickening in bilateral extracranial carotid artery with no evidence of a significant stenosis demonstrated - 1-39% category.  - Vertebral are patent with antegrade flow.  Echo 04/01/16:  - Left ventricle: The cavity size was normal. There was mild concentric hypertrophy. Systolic function was normal. The estimated ejection fraction was in the range of 60% to 65%. Wall motion was normal; there were no regional wall motion abnormalities. Doppler parameters are consistent with abnormal left ventricular relaxation (grade 1 diastolic dysfunction). Doppler parameters are consistent with high ventricular filling pressure. - Aortic valve: Transvalvular velocity was within the normal range. There was no stenosis. There was no regurgitation. - Mitral valve: There was no regurgitation. - Right ventricle: The cavity size was normal. Wall thickness was normal. Systolic function was normal. - Atrial septum: No defect or patent foramen ovale was identified by  color flow Doppler. - Tricuspid valve: There was no regurgitation.  If no changes, I anticipate pt can proceed with surgery as scheduled.   Willeen Cass, FNP-BC [facility] Short Stay Surgical Center/Anesthesiology Phone: [phone] 05/13/2017 10:33 AM

## 2017-05-15 ENCOUNTER — Encounter (HOSPITAL_COMMUNITY): Admission: RE | Payer: Self-pay | Source: Ambulatory Visit

## 2017-05-15 ENCOUNTER — Inpatient Hospital Stay (HOSPITAL_COMMUNITY): Admission: RE | Admit: 2017-05-15 | Payer: BLUE CROSS/BLUE SHIELD | Source: Ambulatory Visit | Admitting: Neurosurgery

## 2017-05-15 SURGERY — POSTERIOR LUMBAR FUSION 1 LEVEL
Anesthesia: General

## 2017-05-25 ENCOUNTER — Other Ambulatory Visit: Payer: Self-pay | Admitting: Family Medicine

## 2017-05-25 ENCOUNTER — Ambulatory Visit (INDEPENDENT_AMBULATORY_CARE_PROVIDER_SITE_OTHER): Payer: BLUE CROSS/BLUE SHIELD | Admitting: Family Medicine

## 2017-05-25 ENCOUNTER — Encounter: Payer: Self-pay | Admitting: Family Medicine

## 2017-05-25 VITALS — BP 122/82 | HR 82 | Temp 98.6°F | Ht 62.0 in | Wt 158.4 lb

## 2017-05-25 DIAGNOSIS — R600 Localized edema: Secondary | ICD-10-CM | POA: Diagnosis not present

## 2017-05-25 LAB — COMPREHENSIVE METABOLIC PANEL
ALT: 17 U/L (ref 0–35)
AST: 16 U/L (ref 0–37)
Albumin: 3.8 g/dL (ref 3.5–5.2)
Alkaline Phosphatase: 78 U/L (ref 39–117)
BUN: 20 mg/dL (ref 6–23)
CO2: 29 mEq/L (ref 19–32)
Calcium: 10 mg/dL (ref 8.4–10.5)
Chloride: 102 mEq/L (ref 96–112)
Creatinine, Ser: 0.8 mg/dL (ref 0.40–1.20)
GFR: 79.61 mL/min (ref >60.00)
Glucose, Bld: 162 mg/dL — ABNORMAL HIGH (ref 70–99)
Potassium: 4.7 mEq/L (ref 3.5–5.1)
Sodium: 138 mEq/L (ref 135–145)
Total Bilirubin: 0.4 mg/dL (ref 0.2–1.2)
Total Protein: 7.4 g/dL (ref 6.0–8.3)

## 2017-05-25 LAB — TSH: TSH: 1.99 u[IU]/mL (ref 0.35–4.50)

## 2017-05-25 NOTE — Progress Notes (Signed)
Pre visit review using our clinic review tool, if applicable. No additional management support is needed unless otherwise documented below in the visit note. 

## 2017-05-25 NOTE — Patient Instructions (Signed)
Wear the compression stockings closer 16 hours daily. Try to elevate legs if possible. Stay as active as possible.  Give [name] 2-3 business days to get the results of your labs back. If labs are normal, you will likely receive a letter in the mail unless you have MyChart. This can take longer than 2-3 business days.

## 2017-05-25 NOTE — Progress Notes (Signed)
Chief Complaint  Patient presents with  . Edema    legs and feet    [name] here for bilateral leg swelling.  Duration: 2 weeks Hx of prolonged bedrest, recent surgery, travel or injury? No Pain the calf? No  SOB? No Personal or family history of clot or bleeding disorder? No Hx of heart failure, renal failure, hepatic failure? No  I saw her for this same thing on 7/20 of this yr and rx'd compression stockings. She was only wearing them for 8 hrs per day. She did not elevate her legs due to chronic low back pain issues.  Her PO intake of salt is low. She does not consume lots of fluids. No cough or weight gain noted.   ROS:  MSK- +leg swelling, no calf pain Lungs- no SOB  Past Medical History:  Diagnosis Date  . Acute bronchitis 07/08/2011  . Anxiety   . COMMON MIGRAINE 10/07/2010  . Decreased visual acuity 11/10/2016  . Depression 12/18/2012  . Diabetes mellitus type II, uncontrolled ([place]) 10/07/2010   Qualifier: Diagnosis of  By: Charlett Blake MD, [name]    . Diabetic foot infection ([place]) 08/26/2016  . Disturbance of skin sensation 10/07/2010  . Heart murmur   . HEART MURMUR, HX OF 10/07/2010  . History of kidney stones    "years ago"  . Hyperlipidemia 12/06/2010  . Hypertension   . Lipoma of abdominal wall 10/05/2016  . NEPHROLITHIASIS, HX OF 10/07/2010  . Overweight(278.02) 12/06/2010  . PERIPHERAL NEUROPATHY, FEET 10/07/2010  . PVD (peripheral vascular disease) ([place]) 01/21/2012  . RESTLESS LEG SYNDROME 10/25/2010  . Stroke [facility]) 2014, 2017   most recently in 2/17 - intracerebral hemorrhage  . Weakness of right side of body 04/19/2013   Family History  Problem Relation Age of Onset  . Arthritis Mother   . Stroke Brother        previous smoker  . Alcohol abuse Brother        in remission  . Leukemia Brother   . Diabetes Paternal Grandmother   . Healthy Son    Past Surgical History:  Procedure Laterality Date  . AMPUTATION TOE Left 09/09/2016   Procedure: AMPUTATION  OF LEFT GREAT TOE;  Surgeon: Milly Jakob, MD;  Location: [facility];  Service: Orthopedics;  Laterality: Left;  . WISDOM TOOTH EXTRACTION      Current Outpatient Prescriptions:  .  aspirin EC 81 MG tablet, Take 81 mg by mouth daily., Disp: , Rfl:  .  azelastine (ASTELIN) 0.1 % nasal spray, Place 2 sprays into both nostrils 2 (two) times daily. Use in each nostril as directed, Disp: 30 mL, Rfl: 3 .  escitalopram (LEXAPRO) 10 MG tablet, Take 1 tablet (10 mg total) by mouth daily., Disp: 30 tablet, Rfl: 2 .  fluticasone (FLONASE) 50 MCG/ACT nasal spray, Place 2 sprays into both nostrils daily., Disp: 16 g, Rfl: 1 .  furosemide (LASIX) 20 MG tablet, 2 tabs po daily x 5 days then 1 tab po daily x 5 dasy then 1 daily as needed for pedal edema and weight gain greater than 3# in 24 hours., Disp: 35 tablet, Rfl: 1 .  gabapentin (NEURONTIN) 100 MG capsule, Take 3 capsules (300 mg total) by mouth 3 (three) times daily., Disp: 90 capsule, Rfl: 3 .  HYDROcodone-acetaminophen (NORCO) 10-325 MG tablet, Take 1-2 tablets by mouth every 6 (six) hours as needed (pain)., Disp: 180 tablet, Rfl: 0 .  LANTUS SOLOSTAR 100 UNIT/ML Solostar Pen, INJECT 50  UNITS IN AM & 50 UNITS IN PM. INCREASE BY 2 UNITS EVERY 3 DAYS AS LONG AS ABOVE 100, Disp: , Rfl: 4 .  lisinopril (PRINIVIL,ZESTRIL) 20 MG tablet, Take 1 tablet (20 mg total) by mouth 2 (two) times daily., Disp: 90 tablet, Rfl: 2 .  meloxicam (MOBIC) 7.5 MG tablet, Take 1 tablet (7.5 mg total) by mouth daily., Disp: 20 tablet, Rfl: 0 .  metFORMIN (GLUCOPHAGE) 1000 MG tablet, TAKE 1 TABLET TWICE DAILY AND 1/2 TABLET AT NOON (Patient taking differently: Take 1000mg  every morning; Take 500 mg at lunch; Take 1000 mg at bedtime), Disp: 75 tablet, Rfl: 1 .  simvastatin (ZOCOR) 10 MG tablet, Take 1 tablet (10 mg total) by mouth daily., Disp: 30 tablet, Rfl: 3 .  Vitamin D, Ergocalciferol, (DRISDOL) 50000 units CAPS capsule, Take 1 capsule (50,000 Units total) by  mouth every 7 (seven) days., Disp: 12 capsule, Rfl: 0 .  LANTUS SOLOSTAR 100 UNIT/ML Solostar Pen, INJECT 34 UNITS IN AM & 32 UNITS IN PM. INCREASE BY 2 UNITS EVERY 3 DAYS AS LONG AS ABOVE 100, Disp: 15 pen, Rfl: 4  BP 122/82 (BP Location: Left Arm, Patient Position: Sitting, Cuff Size: Normal)   Pulse 82   Temp 98.6 F (37 C) (Oral)   Ht 5\' 2"  (1.575 m)   Wt 158 lb 6 oz (71.8 kg)   LMP 02/12/2011 (Exact Date)   SpO2 99%   BMI 28.97 kg/m  Gen- awake, sitting in wheelchair Heart- RRR, +2+ pitting LE edema b/l that tapers up to knees Lungs- CTAB, normal effort w/o accessory muscle use MSK- no calf pain Psych: Age appropriate judgment and insight  Bilateral leg edema  She never got the labs I ordered from last visit. She will obtain TSH and CMP today. No PE findings or anything in hx suggestive of CHF. Explained that this is likely dependent edema because she is not as active due to her chronic back issues. Diuretics are not particularly helpful with this.  New rx for compression stockings, this time thigh high, given. Wear closer to 16 hrs daily as she cannot reasonably elevate her legs throughout the day.  F/u prn. Pt voiced understanding and agreement to the plan.  [place], DO 05/25/17  11:55 AM

## 2017-05-26 ENCOUNTER — Telehealth: Payer: Self-pay | Admitting: Family Medicine

## 2017-05-26 NOTE — Telephone Encounter (Signed)
Pt dropped off document to be filled out by provider (FMLA paperwork for [facility]) pt would like documents when ready to be faxed to them fax: [phone]. Pt also would like to be called when document fax at her tel number [phone] since pt states is needing before Sept 25, 2018 to have paperwork sent out. Document put at front office tray.

## 2017-05-29 NOTE — Telephone Encounter (Signed)
Received FMLA paperwork, will complete as much as possible and then forward to provider/SLS 09/21

## 2017-06-01 ENCOUNTER — Other Ambulatory Visit: Payer: Self-pay | Admitting: Family Medicine

## 2017-06-01 NOTE — Telephone Encounter (Signed)
Forwarded paperwork with pt's note regarding how she would like the time to be documented; copy of last FMLA, copy of last OV to provider/SLS 09/24

## 2017-06-02 NOTE — Telephone Encounter (Signed)
Forms must be turned in today. Call pt [phone] to confirm done today.

## 2017-06-02 NOTE — Telephone Encounter (Signed)
Once I have clarity about hours she wants off per month. Will sign and complete

## 2017-06-08 ENCOUNTER — Other Ambulatory Visit: Payer: Self-pay | Admitting: Neurosurgery

## 2017-06-08 HISTORY — PX: EYE SURGERY: SHX253

## 2017-06-28 ENCOUNTER — Other Ambulatory Visit: Payer: Self-pay | Admitting: Family Medicine

## 2017-06-29 ENCOUNTER — Other Ambulatory Visit: Payer: Self-pay | Admitting: Family Medicine

## 2017-07-18 ENCOUNTER — Other Ambulatory Visit: Payer: Self-pay | Admitting: Family Medicine

## 2017-08-05 ENCOUNTER — Other Ambulatory Visit: Payer: Self-pay | Admitting: Family Medicine

## 2017-08-12 ENCOUNTER — Inpatient Hospital Stay (HOSPITAL_COMMUNITY)
Admission: RE | Admit: 2017-08-12 | Discharge: 2017-08-12 | Disposition: A | Discharge status: Home / Self Care | Payer: BLUE CROSS/BLUE SHIELD | Source: Ambulatory Visit

## 2017-08-12 NOTE — Pre-Procedure Instructions (Signed)
[name]  08/12/2017      [id number] - [place], [place] - [name] [place] [address] Phone: [phone] Fax: [place], [place] [address] [name] [address] Phone: [phone] Fax: [phone]    Your procedure is scheduled on 08/18/17.  Report to [facility] Admitting at 530 A.M.  Call this number if you have problems the morning of surgery:  [phone]   Remember:  Do not eat food or drink liquids after midnight.  Take these medicines the morning of surgery with A SIP OF WATER      Lexapro,gabapentin,hyudrocodone if needed  STOP all herbel meds, nsaids (aleve,naproxen,advil,ibuprofen) prior to surgery also all vitamins/supplements ,aspirin, meloxicam   How to Manage Your Diabetes Before and After Surgery  Why is it important to control my blood sugar before and after surgery? . Improving blood sugar levels before and after surgery helps healing and can limit problems. . A way of improving blood sugar control is eating a healthy diet by: o  Eating less sugar and carbohydrates o  Increasing activity/exercise o  Talking with your doctor about reaching your blood sugar goals . High blood sugars (greater than 180 mg/dL) can raise your risk of infections and slow your recovery, so you will need to focus on controlling your diabetes during the weeks before surgery. . Make sure that the doctor who takes care of your diabetes knows about your planned surgery including the date and location.  How do I manage my blood sugar before surgery? . Check your blood sugar at least 4 times a day, starting 2 days before surgery, to make sure that the level is not too high or low. o Check your blood sugar the morning of your surgery when you wake up and every 2 hours until you get to the Short  Stay unit. . If your blood sugar is less than 70 mg/dL, you will need to treat for low blood sugar: o Do not take insulin. o Treat a low blood sugar (less than 70 mg/dL) with  cup of clear juice (cranberry or apple), 4 glucose tablets, OR glucose gel. Recheck blood sugar in 15 minutes after treatment (to make sure it is greater than 70 mg/dL). If your blood sugar is not greater than 70 mg/dL on recheck, call [phone] o  for further instructions. . Report your blood sugar to the short stay nurse when you get to Short Stay.  . If you are admitted to the hospital after surgery: o Your blood sugar will be checked by the staff and you will probably be given insulin after surgery (instead of oral diabetes medicines) to make sure you have good blood sugar levels. o The goal for blood sugar control after surgery is 80-180 mg/dL.   WHAT DO I DO ABOUT MY DIABETES MEDICATION?  Marland Kitchen Do not take oral diabetes medicines (pills) the morning of surgery.(metformin)  . THE NIGHT BEFORE SURGERY, take 16  units of Lantus insulin.      . THE MORNING OF SURGERY, take 17 units of Lantus insulin.    Do not wear jewelry, make-up or nail polish.  Do not wear lotions, powders, or perfumes, or deoderant.  Do not shave 48 hours prior to surgery.  Men may shave face and neck.  Do not bring  valuables to the hospital.  [facility] is not responsible for any belongings or valuables.  Contacts, dentures or bridgework may not be worn into surgery.  Leave your suitcase in the car.  After surgery it may be brought to your room.  For patients admitted to the hospital, discharge time will be determined by your treatment team.  Patients discharged the day of surgery will not be allowed to drive home.   Special instructions:   Special Instructions: [place] - Preparing for Surgery  Before surgery, you can play an important role.  Because skin is not sterile, your skin needs to be as free of germs as possible.  You  can reduce the number of germs on you skin by washing with CHG (chlorahexidine gluconate) soap before surgery.  CHG is an antiseptic cleaner which kills germs and bonds with the skin to continue killing germs even after washing.  Please DO NOT use if you have an allergy to CHG or antibacterial soaps.  If your skin becomes reddened/irritated stop using the CHG and inform your nurse when you arrive at Short Stay.  Do not shave (including legs and underarms) for at least 48 hours prior to the first CHG shower.  You may shave your face.  Please follow these instructions carefully:   1.  Shower with CHG Soap the night before surgery and the morning of Surgery.  2.  If you choose to wash your hair, wash your hair first as usual with your normal shampoo.  3.  After you shampoo, rinse your hair and body thoroughly to remove the Shampoo.  4.  Use CHG as you would any other liquid soap.  You can apply chg directly  to the skin and wash gently with scrungie or a clean washcloth.  5.  Apply the CHG Soap to your body ONLY FROM THE NECK DOWN.  Do not use on open wounds or open sores.  Avoid contact with your eyes ears, mouth and genitals (private parts).  Wash genitals (private parts)       with your normal soap.  6.  Wash thoroughly, paying special attention to the area where your surgery will be performed.  7.  Thoroughly rinse your body with warm water from the neck down.  8.  DO NOT shower/wash with your normal soap after using and rinsing off the CHG Soap.  9.  Pat yourself dry with a clean towel.            10.  Wear clean pajamas.            11.  Place clean sheets on your bed the night of your first shower and do not sleep with pets.  Day of Surgery  Do not apply any lotions/deodorants the morning of surgery.  Please wear clean clothes to the hospital/surgery center.  Please read over the  fact sheets that you were given.

## 2017-08-13 ENCOUNTER — Telehealth: Payer: Self-pay | Admitting: Family Medicine

## 2017-08-13 ENCOUNTER — Telehealth: Payer: Self-pay | Admitting: Emergency Medicine

## 2017-08-13 ENCOUNTER — Ambulatory Visit (INDEPENDENT_AMBULATORY_CARE_PROVIDER_SITE_OTHER): Payer: BLUE CROSS/BLUE SHIELD | Admitting: Family Medicine

## 2017-08-13 ENCOUNTER — Encounter: Payer: Self-pay | Admitting: Family Medicine

## 2017-08-13 VITALS — BP 140/82 | HR 101 | Temp 98.5°F | Ht 62.0 in | Wt 161.5 lb

## 2017-08-13 DIAGNOSIS — T300 Burn of unspecified body region, unspecified degree: Secondary | ICD-10-CM

## 2017-08-13 DIAGNOSIS — E0842 Diabetes mellitus due to underlying condition with diabetic polyneuropathy: Secondary | ICD-10-CM

## 2017-08-13 DIAGNOSIS — Z0000 Encounter for general adult medical examination without abnormal findings: Secondary | ICD-10-CM | POA: Diagnosis not present

## 2017-08-13 DIAGNOSIS — Z23 Encounter for immunization: Secondary | ICD-10-CM

## 2017-08-13 LAB — COMPREHENSIVE METABOLIC PANEL
ALT: 18 U/L (ref 0–35)
AST: 17 U/L (ref 0–37)
Albumin: 3.7 g/dL (ref 3.5–5.2)
Alkaline Phosphatase: 96 U/L (ref 39–117)
BUN: 16 mg/dL (ref 6–23)
CO2: 29 mEq/L (ref 19–32)
Calcium: 9.1 mg/dL (ref 8.4–10.5)
Chloride: 104 mEq/L (ref 96–112)
Creatinine, Ser: 0.67 mg/dL (ref 0.40–1.20)
GFR: 97.61 mL/min (ref >60.00)
Glucose, Bld: 48 mg/dL — CL (ref 70–99)
Potassium: 3.8 mEq/L (ref 3.5–5.1)
Sodium: 141 mEq/L (ref 135–145)
Total Bilirubin: 0.4 mg/dL (ref 0.2–1.2)
Total Protein: 7.2 g/dL (ref 6.0–8.3)

## 2017-08-13 LAB — LIPID PANEL
Cholesterol: 170 mg/dL (ref 0–200)
HDL: 40.6 mg/dL (ref >39.00)
LDL Cholesterol: 116 mg/dL — ABNORMAL HIGH (ref 0–99)
NonHDL: 128.94
Total CHOL/HDL Ratio: 4
Triglycerides: 67 mg/dL (ref 0.0–149.0)
VLDL: 13.4 mg/dL (ref 0.0–40.0)

## 2017-08-13 LAB — CBC
HCT: 32.7 % — ABNORMAL LOW (ref 36.0–46.0)
Hemoglobin: 10.5 g/dL — ABNORMAL LOW (ref 12.0–15.0)
MCHC: 32.2 g/dL (ref 30.0–36.0)
MCV: 86.5 fl (ref 78.0–100.0)
Platelets: 500 10*3/uL — ABNORMAL HIGH (ref 150.0–400.0)
RBC: 3.78 Mil/uL — ABNORMAL LOW (ref 3.87–5.11)
RDW: 13.7 % (ref 11.5–15.5)
WBC: 12.1 10*3/uL — ABNORMAL HIGH (ref 4.0–10.5)

## 2017-08-13 LAB — HEMOGLOBIN A1C: Hgb A1c MFr Bld: 8.3 % — ABNORMAL HIGH (ref 4.6–6.5)

## 2017-08-13 MED ORDER — SILVER SULFADIAZINE 1 % EX CREA: 1.0000 "application " | TOPICAL_CREAM | Freq: Every day | CUTANEOUS | 0 refills | Status: DC

## 2017-08-13 MED ORDER — HYDROCODONE-ACETAMINOPHEN 10-325 MG PO TABS: 1.0000 | ORAL_TABLET | Freq: Four times a day (QID) | ORAL | 0 refills | Status: DC | PRN

## 2017-08-13 NOTE — Telephone Encounter (Signed)
"  CRITICAL VALUE STICKER  CRITICAL VALUE:48 Blood Sugar  RECEIVER (on-site recipient of call):Kristy P.  DATE & TIME NOTIFIED: 08/13/17 1338  MESSENGER (representative from lab):Karen  MD NOTIFIED: Nani Ravens  TIME OF NOTIFICATION:1338  RESPONSE:

## 2017-08-13 NOTE — Patient Instructions (Signed)
Make sure to remind Dr. Charlett Blake to order your colonoscopy/refer you to GI at the first of the month since we are not doing it today.  Aim to do some physical exertion for 150 minutes per week. This is typically divided into 5 days per week, 30 minutes per day. The activity should be enough to get your heart rate up. Anything is better than nothing if you have time constraints.  EXERCISES  RANGE OF MOTION (ROM) AND STRETCHING EXERCISES - Low Back Prain Most people with lower back pain will find that their symptoms get worse with excessive bending forward (flexion) or arching at the lower back (extension). The exercises that will help resolve your symptoms will focus on the opposite motion.  If you have pain, numbness or tingling which travels down into your buttocks, leg or foot, the goal of the therapy is for these symptoms to move closer to your back and eventually resolve. Sometimes, these leg symptoms will get better, but your lower back pain may worsen. This is often an indication of progress in your rehabilitation. Be very alert to any changes in your symptoms and the activities in which you participated in the 24 hours prior to the change. Sharing this information with your caregiver will allow him or her to most efficiently treat your condition. These exercises may help you when beginning to rehabilitate your injury. Your symptoms may resolve with or without further involvement from your physician, physical therapist or athletic trainer. While completing these exercises, remember:   Restoring tissue flexibility helps normal motion to return to the joints. This allows healthier, less painful movement and activity.  An effective stretch should be held for at least 30 seconds.  A stretch should never be painful. You should only feel a gentle lengthening or release in the stretched tissue. FLEXION RANGE OF MOTION AND STRETCHING EXERCISES:  STRETCH - Flexion, Single Knee to Chest   Lie on a firm  bed or floor with both legs extended in front of you.  Keeping one leg in contact with the floor, bring your opposite knee to your chest. Hold your leg in place by either grabbing behind your thigh or at your knee.  Pull until you feel a gentle stretch in your low back. Hold 30 seconds.  Slowly release your grasp and repeat the exercise with the opposite side. Repeat 2 times. Complete this exercise 3 times per week.   STRETCH - Flexion, Double Knee to Chest  Lie on a firm bed or floor with both legs extended in front of you.  Keeping one leg in contact with the floor, bring your opposite knee to your chest.  Tense your stomach muscles to support your back and then lift your other knee to your chest. Hold your legs in place by either grabbing behind your thighs or at your knees.  Pull both knees toward your chest until you feel a gentle stretch in your low back. Hold 30 seconds.  Tense your stomach muscles and slowly return one leg at a time to the floor. Repeat 2 times. Complete this exercise 3 times per week.   STRETCH - Low Trunk Rotation  Lie on a firm bed or floor. Keeping your legs in front of you, bend your knees so they are both pointed toward the ceiling and your feet are flat on the floor.  Extend your arms out to the side. This will stabilize your upper body by keeping your shoulders in contact with the floor.  Gently and  slowly drop both knees together to one side until you feel a gentle stretch in your low back. Hold for 30 seconds.  Tense your stomach muscles to support your lower back as you bring your knees back to the starting position. Repeat the exercise to the other side. Repeat 2 times. Complete this exercise at least 3 times per week.   EXTENSION RANGE OF MOTION AND FLEXIBILITY EXERCISES:  STRETCH - Extension, Prone on Elbows   Lie on your stomach on the floor, a bed will be too soft. Place your palms about shoulder width apart and at the height of your  head.  Place your elbows under your shoulders. If this is too painful, stack pillows under your chest.  Allow your body to relax so that your hips drop lower and make contact more completely with the floor.  Hold this position for 30 seconds.  Slowly return to lying flat on the floor. Repeat 2 times. Complete this exercise 3 times per week.   RANGE OF MOTION - Extension, Prone Press Ups  Lie on your stomach on the floor, a bed will be too soft. Place your palms about shoulder width apart and at the height of your head.  Keeping your back as relaxed as possible, slowly straighten your elbows while keeping your hips on the floor. You may adjust the placement of your hands to maximize your comfort. As you gain motion, your hands will come more underneath your shoulders.  Hold this position 30 seconds.  Slowly return to lying flat on the floor. Repeat 2 times. Complete this exercise 3 times per week.   RANGE OF MOTION- Quadruped, Neutral Spine   Assume a hands and knees position on a firm surface. Keep your hands under your shoulders and your knees under your hips. You may place padding under your knees for comfort.  Drop your head and point your tailbone toward the ground below you. This will round out your lower back like an angry cat. Hold this position for 30 seconds.  Slowly lift your head and release your tail bone so that your back sags into a large arch, like an old horse.  Hold this position for 30 seconds.  Repeat this until you feel limber in your low back.  Now, find your "sweet spot." This will be the most comfortable position somewhere between the two previous positions. This is your neutral spine. Once you have found this position, tense your stomach muscles to support your low back.  Hold this position for 30 seconds. Repeat 2 times. Complete this exercise 3 times per week.   STRENGTHENING EXERCISES - Low Back Sprain These exercises may help you when beginning to  rehabilitate your injury. These exercises should be done near your "sweet spot." This is the neutral, low-back arch, somewhere between fully rounded and fully arched, that is your least painful position. When performed in this safe range of motion, these exercises can be used for people who have either a flexion or extension based injury. These exercises may resolve your symptoms with or without further involvement from your physician, physical therapist or athletic trainer. While completing these exercises, remember:   Muscles can gain both the endurance and the strength needed for everyday activities through controlled exercises.  Complete these exercises as instructed by your physician, physical therapist or athletic trainer. Increase the resistance and repetitions only as guided.  You may experience muscle soreness or fatigue, but the pain or discomfort you are trying to eliminate should never  worsen during these exercises. If this pain does worsen, stop and make certain you are following the directions exactly. If the pain is still present after adjustments, discontinue the exercise until you can discuss the trouble with your caregiver.  STRENGTHENING - Deep Abdominals, Pelvic Tilt   Lie on a firm bed or floor. Keeping your legs in front of you, bend your knees so they are both pointed toward the ceiling and your feet are flat on the floor.  Tense your lower abdominal muscles to press your low back into the floor. This motion will rotate your pelvis so that your tail bone is scooping upwards rather than pointing at your feet or into the floor. With a gentle tension and even breathing, hold this position for 3 seconds. Repeat 2 times. Complete this exercise 3 times per week.   STRENGTHENING - Abdominals, Crunches   Lie on a firm bed or floor. Keeping your legs in front of you, bend your knees so they are both pointed toward the ceiling and your feet are flat on the floor. Cross your arms over  your chest.  Slightly tip your chin down without bending your neck.  Tense your abdominals and slowly lift your trunk high enough to just clear your shoulder blades. Lifting higher can put excessive stress on the lower back and does not further strengthen your abdominal muscles.  Control your return to the starting position. Repeat 2 times. Complete this exercise 3 times per week.   STRENGTHENING - Quadruped, Opposite UE/LE Lift   Assume a hands and knees position on a firm surface. Keep your hands under your shoulders and your knees under your hips. You may place padding under your knees for comfort.  Find your neutral spine and gently tense your abdominal muscles so that you can maintain this position. Your shoulders and hips should form a rectangle that is parallel with the floor and is not twisted.  Keeping your trunk steady, lift your right hand no higher than your shoulder and then your left leg no higher than your hip. Make sure you are not holding your breath. Hold this position for 30 seconds.  Continuing to keep your abdominal muscles tense and your back steady, slowly return to your starting position. Repeat with the opposite arm and leg. Repeat 2 times. Complete this exercise 3 times per week.   STRENGTHENING - Abdominals and Quadriceps, Straight Leg Raise   Lie on a firm bed or floor with both legs extended in front of you.  Keeping one leg in contact with the floor, bend the other knee so that your foot can rest flat on the floor.  Find your neutral spine, and tense your abdominal muscles to maintain your spinal position throughout the exercise.  Slowly lift your straight leg off the floor about 6 inches for a count of 3, making sure to not hold your breath.  Still keeping your neutral spine, slowly lower your leg all the way to the floor. Repeat this exercise with each leg 2 times. Complete this exercise 3 times per week.  POSTURE AND BODY MECHANICS CONSIDERATIONS -  Low Back Sprain Keeping correct posture when sitting, standing or completing your activities will reduce the stress put on different body tissues, allowing injured tissues a chance to heal and limiting painful experiences. The following are general guidelines for improved posture.  While reading these guidelines, remember:  The exercises prescribed by your provider will help you have the flexibility and strength to maintain correct postures.  The correct posture provides the best environment for your joints to work. All of your joints have less wear and tear when properly supported by a spine with good posture. This means you will experience a healthier, less painful body.  Correct posture must be practiced with all of your activities, especially prolonged sitting and standing. Correct posture is as important when doing repetitive low-stress activities (typing) as it is when doing a single heavy-load activity (lifting).  RESTING POSITIONS Consider which positions are most painful for you when choosing a resting position. If you have pain with flexion-based activities (sitting, bending, stooping, squatting), choose a position that allows you to rest in a less flexed posture. You would want to avoid curling into a fetal position on your side. If your pain worsens with extension-based activities (prolonged standing, working overhead), avoid resting in an extended position such as sleeping on your stomach. Most people will find more comfort when they rest with their spine in a more neutral position, neither too rounded nor too arched. Lying on a non-sagging bed on your side with a pillow between your knees, or on your back with a pillow under your knees will often provide some relief. Keep in mind, being in any one position for a prolonged period of time, no matter how correct your posture, can still lead to stiffness.  PROPER SITTING POSTURE In order to minimize stress and discomfort on your spine, you  must sit with correct posture. Sitting with good posture should be effortless for a healthy body. Returning to good posture is a gradual process. Many people can work toward this most comfortably by using various supports until they have the flexibility and strength to maintain this posture on their own. When sitting with proper posture, your ears will fall over your shoulders and your shoulders will fall over your hips. You should use the back of the chair to support your upper back. Your lower back will be in a neutral position, just slightly arched. You may place a small pillow or folded towel at the base of your lower back for  support.  When working at a desk, create an environment that supports good, upright posture. Without extra support, muscles tire, which leads to excessive strain on joints and other tissues. Keep these recommendations in mind:  CHAIR:  A chair should be able to slide under your desk when your back makes contact with the back of the chair. This allows you to work closely.  The chair's height should allow your eyes to be level with the upper part of your monitor and your hands to be slightly lower than your elbows.  BODY POSITION  Your feet should make contact with the floor. If this is not possible, use a foot rest.  Keep your ears over your shoulders. This will reduce stress on your neck and low back.  INCORRECT SITTING POSTURES  If you are feeling tired and unable to assume a healthy sitting posture, do not slouch or slump. This puts excessive strain on your back tissues, causing more damage and pain. Healthier options include:  Using more support, like a lumbar pillow.  Switching tasks to something that requires you to be upright or walking.  Talking a brief walk.  Lying down to rest in a neutral-spine position.  PROLONGED STANDING WHILE SLIGHTLY LEANING FORWARD  When completing a task that requires you to lean forward while standing in one place for a long  time, place either foot up on a stationary 2-4  inch high object to help maintain the best posture. When both feet are on the ground, the lower back tends to lose its slight inward curve. If this curve flattens (or becomes too large), then the back and your other joints will experience too much stress, tire more quickly, and can cause pain.  CORRECT STANDING POSTURES Proper standing posture should be assumed with all daily activities, even if they only take a few moments, like when brushing your teeth. As in sitting, your ears should fall over your shoulders and your shoulders should fall over your hips. You should keep a slight tension in your abdominal muscles to brace your spine. Your tailbone should point down to the ground, not behind your body, resulting in an over-extended swayback posture.   INCORRECT STANDING POSTURES  Common incorrect standing postures include a forward head, locked knees and/or an excessive swayback. WALKING Walk with an upright posture. Your ears, shoulders and hips should all line-up.  PROLONGED ACTIVITY IN A FLEXED POSITION When completing a task that requires you to bend forward at your waist or lean over a low surface, try to find a way to stabilize 3 out of 4 of your limbs. You can place a hand or elbow on your thigh or rest a knee on the surface you are reaching across. This will provide you more stability, so that your muscles do not tire as quickly. By keeping your knees relaxed, or slightly bent, you will also reduce stress across your lower back. CORRECT LIFTING TECHNIQUES  DO :  Assume a wide stance. This will provide you more stability and the opportunity to get as close as possible to the object which you are lifting.  Tense your abdominals to brace your spine. Bend at the knees and hips. Keeping your back locked in a neutral-spine position, lift using your leg muscles. Lift with your legs, keeping your back straight.  Test the weight of unknown objects  before attempting to lift them.  Try to keep your elbows locked down at your sides in order get the best strength from your shoulders when carrying an object.     Always ask for help when lifting heavy or awkward objects. INCORRECT LIFTING TECHNIQUES DO NOT:   Lock your knees when lifting, even if it is a small object.  Bend and twist. Pivot at your feet or move your feet when needing to change directions.  Assume that you can safely pick up even a paperclip without proper posture.

## 2017-08-13 NOTE — Progress Notes (Addendum)
Chief Complaint  Patient presents with  . Annual Exam     Well Woman [name] is here for a complete physical.   Her last physical was >1 year ago.  Current diet: in general, a "healthy" diet. Current exercise: active at work. Wt has been steadily increasing. No daytime fatigue Patient's last menstrual period was 02/12/2011 (exact date).  Seatbelt? Yes   Burn on stomach from grease. Has been using some sort of spray on in.  Denies fevers or drainage.  Health Maintenance Pap/HPV- Yes Mammogram- Yes Tetanus- Yes Hep C screening- Yes HIV screening- Yes  Colonscopy- will have Dr. Charlett Blake order at first of year.  Past Medical History:  Diagnosis Date  . Anxiety   . COMMON MIGRAINE 10/07/2010  . Decreased visual acuity 11/10/2016  . Depression 12/18/2012  . Diabetes mellitus type II, uncontrolled ([place]) 10/07/2010   Qualifier: Diagnosis of  By: Charlett Blake MD, [name]    . Diabetic foot infection ([place]) 08/26/2016  . Disturbance of skin sensation 10/07/2010  . Heart murmur   . History of kidney stones    "years ago"  . Hyperlipidemia 12/06/2010  . Hypertension   . Lipoma of abdominal wall 10/05/2016  . Overweight(278.02) 12/06/2010  . PERIPHERAL NEUROPATHY, FEET 10/07/2010  . PVD (peripheral vascular disease) ([place]) 01/21/2012  . RESTLESS LEG SYNDROME 10/25/2010  . Stroke [facility]) 2014, 2017   most recently in 2/17 - intracerebral hemorrhage     Past Surgical History:  Procedure Laterality Date  . AMPUTATION TOE Left 09/09/2016   Procedure: AMPUTATION OF LEFT GREAT TOE;  Surgeon: Milly Jakob, MD;  Location: [facility];  Service: Orthopedics;  Laterality: Left;  . WISDOM TOOTH EXTRACTION      Medications  Current Outpatient Medications on File Prior to Visit  Medication Sig Dispense Refill  . aspirin EC 81 MG tablet Take 81 mg by mouth daily.    Marland Kitchen azelastine (ASTELIN) 0.1 % nasal spray Place 2 sprays into both nostrils 2 (two) times daily. Use in each nostril as  directed 30 mL 3  . escitalopram (LEXAPRO) 10 MG tablet Take 1 tablet (10 mg total) by mouth daily. 30 tablet 2  . fluticasone (FLONASE) 50 MCG/ACT nasal spray Place 2 sprays into both nostrils daily. 16 g 1  . furosemide (LASIX) 20 MG tablet TAKE 2 TABS DAILY X5DAYS, TAKE 1 TAB DAILY X5DAYS. THEN TAKE 1 TAB DAILY AS NEEDED FOR EDEMA 35 tablet 1  . gabapentin (NEURONTIN) 100 MG capsule Take 3 capsules (300 mg total) by mouth 3 (three) times daily. 90 capsule 3  . HYDROcodone-acetaminophen (NORCO) 10-325 MG tablet Take 1-2 tablets by mouth every 6 (six) hours as needed (pain). 180 tablet 0  . LANTUS SOLOSTAR 100 UNIT/ML Solostar Pen INJECT 50 UNITS IN AM & 50 UNITS IN PM. INCREASE BY 2 UNITS EVERY 3 DAYS AS LONG AS ABOVE 100  4  . LANTUS SOLOSTAR 100 UNIT/ML Solostar Pen INJECT 34 UNITS IN AM & 32 UNITS IN PM. INCREASE BY 2 UNITS EVERY 3 DAYS AS LONG AS ABOVE 100 15 pen 4  . lisinopril (PRINIVIL,ZESTRIL) 20 MG tablet Take 1 tablet (20 mg total) by mouth 2 (two) times daily. 90 tablet 2  . meloxicam (MOBIC) 7.5 MG tablet TAKE 1 TABLET BY MOUTH EVERY DAY 20 tablet 0  . metFORMIN (GLUCOPHAGE) 1000 MG tablet TAKE 1 TABLET TWICE DAILY AND 1/2 TABLET AT NOON (Patient taking differently: Take 1000mg  every morning; Take 500 mg at lunch; Take 1000  mg at bedtime) 75 tablet 1  . simvastatin (ZOCOR) 10 MG tablet Take 1 tablet (10 mg total) by mouth daily. 30 tablet 3  . Vitamin D, Ergocalciferol, (DRISDOL) 50000 units CAPS capsule Take 1 capsule (50,000 Units total) by mouth every 7 (seven) days. 12 capsule 0   Allergies Allergies  Allergen Reactions  . Lyrica [Pregabalin] Other (See Comments)    MADE PATIENT VERY EMOTIONAL AND WOULD CRY EASILY  . Morphine And Related Nausea And Vomiting  . Tramadol Nausea And Vomiting    Pt cant tolerate this pain med.     Review of Systems: Constitutional:  no fevers Eye:  no recent significant change in vision Ear/Nose/Mouth/Throat:  Ears:  no tinnitus or vertigo  and no recent change in hearing, Nose/Mouth/Throat:  no complaints of nasal congestion, no sore throat Cardiovascular: no chest pain, no palpitations Respiratory:  no cough and no shortness of breath Gastrointestinal:  no abdominal pain, no change in bowel habits, no significant change in appetite, no nausea, vomiting, diarrhea, or constipation and no black or bloody stool GU:  Female: negative for dysuria, frequency, and incontinence; no abnormal bleeding, pelvic pain, or discharge Musculoskeletal/Extremities: + Chronic low back pain; otherwise no pain of the joints Integumentary (Skin/Breast):  no abnormal skin lesions reported Neurologic:  no headaches Endocrine:  denies fatigue Hematologic/Lymphatic:  + Weight gain  Exam BP 140/82 (BP Location: Left Arm, Patient Position: Sitting, Cuff Size: Normal)   Pulse (!) 101   Temp 98.5 F (36.9 C) (Oral)   Ht 5\' 2"  (1.575 m)   Wt 161 lb 8 oz (73.3 kg)   LMP 02/12/2011 (Exact Date)   SpO2 97%   BMI 29.54 kg/m  General:  well developed, well nourished, in no apparent distress Skin: See below; otherwise no significant moles, warts, or growths Head:  no masses, lesions, or tenderness Eyes:  pupils equal and round, sclera anicteric without injection Ears:  canals without lesions, TMs shiny without retraction, no obvious effusion, no erythema Nose:  nares patent, septum midline, mucosa normal, and no drainage or sinus tenderness Throat/Pharynx:  lips and gingiva without lesion; tongue and uvula midline; non-inflamed pharynx; no exudates or postnasal drainage Neck: neck supple without adenopathy, thyromegaly, or masses Breasts:  Not done Lungs:  clear to auscultation, breath sounds equal bilaterally, no respiratory distress Cardio:  regular rate and rhythm, +SEM, heart sounds without clicks or rubs, point of maximal impulse normal; no lifts, heaves, or thrills Abdomen:  abdomen soft, nontender; bowel sounds normal; no masses or  organomegaly Genital: Defer to reg PCP Musculoskeletal:  symmetrical muscle groups noted without atrophy or deformity Extremities:  no clubbing, cyanosis, or edema, no deformities, no skin discoloration Neuro:  gait normal; deep tendon reflexes normal and symmetric Psych: well oriented with normal range of affect and appropriate judgment/insight  Media Information    Assessment and Plan  Well adult exam - Plan: CBC, Comprehensive metabolic panel, Lipid panel  Burn  Diabetic polyneuropathy associated with diabetes mellitus due to underlying condition ([place]) - Plan: Hemoglobin A1c, Microalbumin / creatinine urine ratio  Need for influenza vaccination - Plan: Flu Vaccine QUAD 6+ mos PF IM (Fluarix Quad PF)  Need for vaccination against Streptococcus pneumoniae - Plan: Pneumococcal polysaccharide vaccine 23-valent greater than or equal to 2yo subcutaneous/IM   Well 51 y.o. female. Counseled on diet and exercise. Flu shot and pneumonia vaccine today. When discussing Pap smears and colonoscopy, the patient wishes to have her regular PCP do the Pap and have  her order at the first of the year for insurance purposes. Other orders as above. Follow up in 1 mo w Dr. Charlett Blake for pap. The patient voiced understanding and agreement to the plan.  [place], DO 08/13/17 8:52 AM

## 2017-08-13 NOTE — Telephone Encounter (Signed)
Received call from Butch Penny, [place] stating that she received a call from [facility] lab representative [facility], with a critical [place]. Lab was drawn this morning, ordered by Dr. Nani Ravens.

## 2017-08-13 NOTE — Addendum Note (Signed)
Addended by: Caffie Pinto on: 08/13/2017 11:47 AM   Modules accepted: Orders

## 2017-08-13 NOTE — Telephone Encounter (Signed)
Pt was asymptomatic at time of draw. Let's call pt and make sure she is feeling OK. She works nights and may be sleeping. TY.

## 2017-08-13 NOTE — Progress Notes (Signed)
Pre visit review using our clinic review tool, if applicable. No additional management support is needed unless otherwise documented below in the visit note. 

## 2017-08-13 NOTE — Telephone Encounter (Signed)
Received fax with results from [facility] lab; forwarded to provider/SLS 12/06

## 2017-08-13 NOTE — Telephone Encounter (Signed)
Patient notified of results/instructions. She states she is feeling just fine no symptoms

## 2017-08-14 ENCOUNTER — Other Ambulatory Visit (INDEPENDENT_AMBULATORY_CARE_PROVIDER_SITE_OTHER): Payer: BLUE CROSS/BLUE SHIELD

## 2017-08-14 ENCOUNTER — Other Ambulatory Visit: Payer: Self-pay | Admitting: Family Medicine

## 2017-08-14 ENCOUNTER — Telehealth: Payer: Self-pay | Admitting: Family Medicine

## 2017-08-14 DIAGNOSIS — E0842 Diabetes mellitus due to underlying condition with diabetic polyneuropathy: Secondary | ICD-10-CM

## 2017-08-14 DIAGNOSIS — Z0000 Encounter for general adult medical examination without abnormal findings: Secondary | ICD-10-CM | POA: Diagnosis not present

## 2017-08-14 DIAGNOSIS — D649 Anemia, unspecified: Secondary | ICD-10-CM

## 2017-08-14 DIAGNOSIS — E162 Hypoglycemia, unspecified: Secondary | ICD-10-CM

## 2017-08-14 LAB — MICROALBUMIN / CREATININE URINE RATIO
Creatinine,U: 128 mg/dL
Microalb Creat Ratio: 136.3 mg/g — ABNORMAL HIGH (ref 0.0–30.0)
Microalb, Ur: 174.3 mg/dL — ABNORMAL HIGH (ref 0.0–1.9)

## 2017-08-14 NOTE — Telephone Encounter (Signed)
Reviewed urine results and physician's note with patient.

## 2017-08-18 ENCOUNTER — Inpatient Hospital Stay (HOSPITAL_COMMUNITY): Admission: RE | Admit: 2017-08-18 | Payer: BLUE CROSS/BLUE SHIELD | Source: Ambulatory Visit | Admitting: Neurosurgery

## 2017-08-18 ENCOUNTER — Encounter (HOSPITAL_COMMUNITY): Admission: RE | Payer: Self-pay | Source: Ambulatory Visit

## 2017-08-18 SURGERY — POSTERIOR LUMBAR FUSION 1 LEVEL
Anesthesia: General

## 2017-08-20 ENCOUNTER — Other Ambulatory Visit: Payer: BLUE CROSS/BLUE SHIELD

## 2017-08-22 ENCOUNTER — Other Ambulatory Visit: Payer: Self-pay | Admitting: Family Medicine

## 2017-09-17 ENCOUNTER — Encounter: Payer: Self-pay | Admitting: Family Medicine

## 2017-09-17 ENCOUNTER — Ambulatory Visit (INDEPENDENT_AMBULATORY_CARE_PROVIDER_SITE_OTHER): Payer: BLUE CROSS/BLUE SHIELD | Admitting: Family Medicine

## 2017-09-17 VITALS — BP 176/92 | HR 82 | Temp 98.1°F | Resp 19 | Wt 159.4 lb

## 2017-09-17 DIAGNOSIS — M545 Low back pain, unspecified: Secondary | ICD-10-CM

## 2017-09-17 DIAGNOSIS — I10 Essential (primary) hypertension: Secondary | ICD-10-CM

## 2017-09-17 DIAGNOSIS — H409 Unspecified glaucoma: Secondary | ICD-10-CM | POA: Insufficient documentation

## 2017-09-17 DIAGNOSIS — E782 Mixed hyperlipidemia: Secondary | ICD-10-CM

## 2017-09-17 DIAGNOSIS — G5793 Unspecified mononeuropathy of bilateral lower limbs: Secondary | ICD-10-CM

## 2017-09-17 DIAGNOSIS — E11319 Type 2 diabetes mellitus with unspecified diabetic retinopathy without macular edema: Secondary | ICD-10-CM | POA: Insufficient documentation

## 2017-09-17 DIAGNOSIS — Z79899 Other long term (current) drug therapy: Secondary | ICD-10-CM | POA: Diagnosis not present

## 2017-09-17 MED ORDER — HYDROCODONE-ACETAMINOPHEN 10-325 MG PO TABS: 1.0000 | ORAL_TABLET | Freq: Four times a day (QID) | ORAL | 0 refills | Status: DC | PRN

## 2017-09-17 NOTE — Assessment & Plan Note (Signed)
Tolerating statin, encouraged heart healthy diet, avoid trans fats, minimize simple carbs and saturated fats. Increase exercise as tolerated 

## 2017-09-17 NOTE — Assessment & Plan Note (Signed)
Low back pain tolerable with decreased straining is having toforego surgery presently due to husband's health concerns. Can continue pain meds prn

## 2017-09-17 NOTE — Assessment & Plan Note (Signed)
Did not take her blood pressure medicine today and she worked last night. No change in meds today

## 2017-09-17 NOTE — Assessment & Plan Note (Signed)
Has a local opthamologist who is preparing to treat her right eye

## 2017-09-17 NOTE — Patient Instructions (Signed)
Peripheral Neuropathy Peripheral neuropathy is a type of nerve damage. It affects nerves that carry signals between the spinal cord and other parts of the body. These are called peripheral nerves. With peripheral neuropathy, one nerve or a group of nerves may be damaged. What are the causes? Many things can damage peripheral nerves. For some people with peripheral neuropathy, the cause is unknown. Some causes include:  Diabetes. This is the most common cause of peripheral neuropathy.  Injury to a nerve.  Pressure or stress on a nerve that lasts a long time.  Too little vitamin B. Alcoholism can lead to this.  Infections.  Autoimmune diseases, such as multiple sclerosis and systemic lupus erythematosus.  Inherited nerve diseases.  Some medicines, such as cancer drugs.  Toxic substances, such as lead and mercury.  Too little blood flowing to the legs.  Kidney disease.  Thyroid disease.  What are the signs or symptoms? Different people have different symptoms. The symptoms you have will depend on which of your nerves is damaged. Common symptoms include:  Loss of feeling (numbness) in the feet and hands.  Tingling in the feet and hands.  Pain that burns.  Very sensitive skin.  Weakness.  Not being able to move a part of the body (paralysis).  Muscle twitching.  Clumsiness or poor coordination.  Loss of balance.  Not being able to control your bladder.  Feeling dizzy.  Sexual problems.  How is this diagnosed? Peripheral neuropathy is a symptom, not a disease. Finding the cause of peripheral neuropathy can be hard. To figure that out, your health care provider will take a medical history and do a physical exam. A neurological exam will also be done. This involves checking things affected by your brain, spinal cord, and nerves (nervous system). For example, your health care provider will check your reflexes, how you move, and what you can feel. Other types of tests  may also be ordered, such as:  Blood tests.  A test of the fluid in your spinal cord.  Imaging tests, such as CT scans or an MRI.  Electromyography (EMG). This test checks the nerves that control muscles.  Nerve conduction velocity tests. These tests check how fast messages pass through your nerves.  Nerve biopsy. A small piece of nerve is removed. It is then checked under a microscope.  How is this treated?  Medicine is often used to treat peripheral neuropathy. Medicines may include: ? Pain-relieving medicines. Prescription or over-the-counter medicine may be suggested. ? Antiseizure medicine. This may be used for pain. ? Antidepressants. These also may help ease pain from neuropathy. ? Lidocaine. This is a numbing medicine. You might wear a patch or be given a shot. ? Mexiletine. This medicine is typically used to help control irregular heart rhythms.  Surgery. Surgery may be needed to relieve pressure on a nerve or to destroy a nerve that is causing pain.  Physical therapy to help movement.  Assistive devices to help movement. Follow these instructions at home:  Only take over-the-counter or prescription medicines as directed by your health care provider. Follow the instructions carefully for any given medicines. Do not take any other medicines without first getting approval from your health care provider.  If you have diabetes, work closely with your health care provider to keep your blood sugar under control.  If you have numbness in your feet: ? Check every day for signs of injury or infection. Watch for redness, warmth, and swelling. ? Wear padded socks and comfortable   shoes. These help protect your feet.  Do not do things that put pressure on your damaged nerve.  Do not smoke. Smoking keeps blood from getting to damaged nerves.  Avoid or limit alcohol. Too much alcohol can cause a lack of B vitamins. These vitamins are needed for healthy nerves.  Develop a good  support system. Coping with peripheral neuropathy can be stressful. Talk to a mental health specialist or join a support group if you are struggling.  Follow up with your health care provider as directed. Contact a health care provider if:  You have new signs or symptoms of peripheral neuropathy.  You are struggling emotionally from dealing with peripheral neuropathy.  You have a fever. Get help right away if:  You have an injury or infection that is not healing.  You feel very dizzy or begin vomiting.  You have chest pain.  You have trouble breathing. This information is not intended to replace advice given to you by your health care provider. Make sure you discuss any questions you have with your health care provider. Document Released: 08/15/2002 Document Revised: 01/31/2016 Document Reviewed: 05/02/2013 Elsevier Interactive Patient Education  2017 Elsevier Inc.  

## 2017-09-17 NOTE — Assessment & Plan Note (Signed)
Continues to struggle with her chronic pain and illness and now her husband has metastatic prostate cancer. Does not want to try increasing her Lexzapro for now.

## 2017-09-17 NOTE — Assessment & Plan Note (Signed)
Had surgery at [facility] in December for extreme pressure in left eye. Is improving.

## 2017-09-17 NOTE — Progress Notes (Signed)
Subjective:  I acted as a Education administrator for Dr. Charlett Blake. [name], [place]  [name], female    DOB: 10-Feb-1966, 52 y.o.   MRN: [id number]  No chief complaint on file.   HPI  Patient is in today for a medication follow up. She is struggling with stress secondary to her husband being diagnosed with metastatic prostate cancer. She is working again and struggles with fatigue, back pain and peripheral neuropathy.  No recent febrile illness but she did have surgery for severe pressure in left eye in December. Sugars are improving and she is doing a better job of decreasing her carb intake. Denies CP/palp/SOB/HA/congestion/fevers/GI or GU c/o. Taking meds as prescribed  Patient Care Team: Mosie Lukes, MD as PCP - General (Family Medicine) Alda Berthold, DO as Consulting Physician (Neurology) Pieter Partridge, DO as Consulting Physician (Neurology) Josue Hector, MD as Consulting Physician (Cardiology) Ricard Dillon, MD as Consulting Physician (Internal Medicine)   Past Medical History:  Diagnosis Date  . Anxiety   . COMMON MIGRAINE 10/07/2010  . Decreased visual acuity 11/10/2016  . Depression 12/18/2012  . Diabetes mellitus type II, uncontrolled ([place]) 10/07/2010   Qualifier: Diagnosis of  By: Charlett Blake MD, [name]    . Diabetic foot infection ([place]) 08/26/2016  . Disturbance of skin sensation 10/07/2010  . Heart murmur   . History of kidney stones    "years ago"  . Hyperlipidemia 12/06/2010  . Hypertension   . Lipoma of abdominal wall 10/05/2016  . Overweight(278.02) 12/06/2010  . PERIPHERAL NEUROPATHY, FEET 10/07/2010  . PVD (peripheral vascular disease) ([place]) 01/21/2012  . RESTLESS LEG SYNDROME 10/25/2010  . Stroke [facility]) 2014, 2017   most recently in 2/17 - intracerebral hemorrhage    Past Surgical History:  Procedure Laterality Date  . AMPUTATION TOE Left 09/09/2016   Procedure: AMPUTATION OF LEFT GREAT TOE;  Surgeon: Milly Jakob, MD;  Location: [facility];   Service: Orthopedics;  Laterality: Left;  . WISDOM TOOTH EXTRACTION      Family History  Problem Relation Age of Onset  . Arthritis Mother   . Stroke Brother        previous smoker  . Alcohol abuse Brother        in remission  . Leukemia Brother   . Diabetes Paternal Grandmother   . Healthy Son     Social History   Socioeconomic History  . Marital status: Single    Spouse name: Not on file  . Number of children: Not on file  . Years of education: Not on file  . Highest education level: Not on file  Social Needs  . Financial resource strain: Not on file  . Food insecurity - worry: Not on file  . Food insecurity - inability: Not on file  . Transportation needs - medical: Not on file  . Transportation needs - non-medical: Not on file  Occupational History  . Occupation: Glass blower/designer  Tobacco Use  . Smoking status: Former Smoker    Packs/day: 0.50    Last attempt to quit: 09/08/2005    Years since quitting: 12.0  . Smokeless tobacco: Never Used  Substance and Sexual Activity  . Alcohol use: No    Alcohol/week: 0.0 oz  . Drug use: No  . Sexual activity: Yes    Partners: Male    Birth control/protection: None  Other Topics Concern  . Not on file  Social History Narrative   Lives with husband in a  two story home.  Has one child.     Works as a Glass blower/designer.     Education: high school.    Outpatient Medications Prior to Visit  Medication Sig Dispense Refill  . aspirin EC 81 MG tablet Take 81 mg by mouth daily.    Marland Kitchen azelastine (ASTELIN) 0.1 % nasal spray Place 2 sprays into both nostrils 2 (two) times daily. Use in each nostril as directed 30 mL 3  . escitalopram (LEXAPRO) 10 MG tablet Take 1 tablet (10 mg total) by mouth daily. 30 tablet 2  . fluticasone (FLONASE) 50 MCG/ACT nasal spray Place 2 sprays into both nostrils daily. 16 g 1  . furosemide (LASIX) 20 MG tablet TAKE 2 TABS DAILY X5DAYS, TAKE 1 TAB DAILY X5DAYS. THEN TAKE 1 TAB DAILY AS NEEDED FOR EDEMA  35 tablet 1  . gabapentin (NEURONTIN) 100 MG capsule Take 3 capsules (300 mg total) by mouth 3 (three) times daily. 90 capsule 3  . LANTUS SOLOSTAR 100 UNIT/ML Solostar Pen INJECT 50 UNITS IN AM & 50 UNITS IN PM. INCREASE BY 2 UNITS EVERY 3 DAYS AS LONG AS ABOVE 100  4  . LANTUS SOLOSTAR 100 UNIT/ML Solostar Pen INJECT 34 UNITS IN AM & 32 UNITS IN PM. INCREASE BY 2 UNITS EVERY 3 DAYS AS LONG AS ABOVE 100 15 pen 4  . lisinopril (PRINIVIL,ZESTRIL) 20 MG tablet Take 1 tablet (20 mg total) by mouth 2 (two) times daily. 90 tablet 2  . meloxicam (MOBIC) 7.5 MG tablet TAKE 1 TABLET BY MOUTH EVERY DAY 20 tablet 0  . metFORMIN (GLUCOPHAGE) 1000 MG tablet TAKE 1 TABLET TWICE DAILY AND 1/2 TABLET AT NOON (Patient taking differently: Take 1000mg  every morning; Take 500 mg at lunch; Take 1000 mg at bedtime) 75 tablet 1  . silver sulfADIAZINE (SILVADENE) 1 % cream Apply 1 application topically daily. 50 g 0  . simvastatin (ZOCOR) 10 MG tablet Take 1 tablet (10 mg total) by mouth daily. 30 tablet 3  . Vitamin D, Ergocalciferol, (DRISDOL) 50000 units CAPS capsule Take 1 capsule (50,000 Units total) by mouth every 7 (seven) days. 12 capsule 0  . HYDROcodone-acetaminophen (NORCO) 10-325 MG tablet Take 1-2 tablets by mouth every 6 (six) hours as needed (pain). 60 tablet 0   No facility-administered medications prior to visit.     Allergies  Allergen Reactions  . Lyrica [Pregabalin] Other (See Comments)    MADE PATIENT VERY EMOTIONAL AND WOULD CRY EASILY  . Morphine And Related Nausea And Vomiting  . Tramadol Nausea And Vomiting    Pt cant tolerate this pain med.     Review of Systems  Constitutional: Positive for malaise/fatigue. Negative for fever.  HENT: Negative for congestion.   Eyes: Negative for blurred vision.  Respiratory: Negative for cough and shortness of breath.   Cardiovascular: Negative for chest pain, palpitations and leg swelling.  Gastrointestinal: Negative for vomiting.    Musculoskeletal: Positive for back pain and myalgias.  Skin: Negative for rash.  Neurological: Negative for loss of consciousness and headaches.       Objective:    Physical Exam  Constitutional: She is oriented to person, place, and time. She appears well-developed and well-nourished. No distress.  HENT:  Head: Normocephalic and atraumatic.  Eyes: Conjunctivae are normal.  Neck: Normal range of motion. No thyromegaly present.  Cardiovascular: Normal rate and regular rhythm.  Pulmonary/Chest: Effort normal and breath sounds normal. She has no wheezes.  Abdominal: Soft. Bowel sounds are normal. There  is no tenderness.  Musculoskeletal: Normal range of motion. She exhibits no edema or deformity.  Neurological: She is alert and oriented to person, place, and time.  Skin: Skin is warm and dry. She is not diaphoretic.  Psychiatric: She has a normal mood and affect.    BP (!) 176/92 (BP Location: Left Arm, Patient Position: Sitting, Cuff Size: Normal)   Pulse 82   Temp 98.1 F (36.7 C) (Oral)   Resp 19   Wt 159 lb 6.4 oz (72.3 kg)   LMP 02/12/2011 (Exact Date)   SpO2 98%   BMI 29.15 kg/m  Wt Readings from Last 3 Encounters:  09/17/17 159 lb 6.4 oz (72.3 kg)  08/13/17 161 lb 8 oz (73.3 kg)  05/25/17 158 lb 6 oz (71.8 kg)   BP Readings from Last 3 Encounters:  09/17/17 (!) 176/92  08/13/17 140/82  05/25/17 122/82     Immunization History  Administered Date(s) Administered  . Influenza Split 05/22/2011, 06/29/2012  . Influenza Whole 05/09/2010  . Influenza,inj,Quad PF,6+ Mos 05/23/2013, 05/30/2014, 09/05/2016, 08/13/2017  . Pneumococcal Polysaccharide-23 08/13/2017  . Td 09/08/2008    Health Maintenance  Topic Date Due  . PAP SMEAR  09/09/1991  . COLONOSCOPY  12/13/2013  . FOOT EXAM  11/10/2017  . OPHTHALMOLOGY EXAM  12/30/2017  . HEMOGLOBIN A1C  02/11/2018  . TETANUS/TDAP  09/08/2018  . MAMMOGRAM  11/27/2018  . PNEUMOCOCCAL POLYSACCHARIDE VACCINE (2) 08/13/2022   . INFLUENZA VACCINE  Completed  . Hepatitis C Screening  Completed  . HIV Screening  Completed    Lab Results  Component Value Date   WBC 12.1 (H) 08/13/2017   HGB 10.5 (L) 08/13/2017   HCT 32.7 (L) 08/13/2017   PLT 500.0 (H) 08/13/2017   GLUCOSE 48 Repeated and verified X2. (LL) 08/13/2017   CHOL 170 08/13/2017   TRIG 67.0 08/13/2017   HDL 40.60 08/13/2017   LDLDIRECT 133.0 03/02/2017   LDLCALC 116 (H) 08/13/2017   ALT 18 08/13/2017   AST 17 08/13/2017   NA 141 08/13/2017   K 3.8 08/13/2017   CL 104 08/13/2017   CREATININE 0.67 08/13/2017   BUN 16 08/13/2017   CO2 29 08/13/2017   TSH 1.99 05/25/2017   INR 1.07 03/30/2016   HGBA1C 8.3 (H) 08/13/2017   MICROALBUR 174.3 (H) 08/14/2017    Lab Results  Component Value Date   TSH 1.99 05/25/2017   Lab Results  Component Value Date   WBC 12.1 (H) 08/13/2017   HGB 10.5 (L) 08/13/2017   HCT 32.7 (L) 08/13/2017   MCV 86.5 08/13/2017   PLT 500.0 (H) 08/13/2017   Lab Results  Component Value Date   NA 141 08/13/2017   K 3.8 08/13/2017   CO2 29 08/13/2017   GLUCOSE 48 Repeated and verified X2. (LL) 08/13/2017   BUN 16 08/13/2017   CREATININE 0.67 08/13/2017   BILITOT 0.4 08/13/2017   ALKPHOS 96 08/13/2017   AST 17 08/13/2017   ALT 18 08/13/2017   PROT 7.2 08/13/2017   ALBUMIN 3.7 08/13/2017   CALCIUM 9.1 08/13/2017   ANIONGAP 7 05/12/2017   GFR 97.61 08/13/2017   Lab Results  Component Value Date   CHOL 170 08/13/2017   Lab Results  Component Value Date   HDL 40.60 08/13/2017   Lab Results  Component Value Date   LDLCALC 116 (H) 08/13/2017   Lab Results  Component Value Date   TRIG 67.0 08/13/2017   Lab Results  Component Value Date   CHOLHDL  4 08/13/2017   Lab Results  Component Value Date   HGBA1C 8.3 (H) 08/13/2017         Assessment & Plan:   Problem List Items Addressed This Visit    Essential hypertension, benign    Did not take her blood pressure medicine today and she worked  last night. No change in meds today      Hyperlipidemia    Tolerating statin, encouraged heart healthy diet, avoid trans fats, minimize simple carbs and saturated fats. Increase exercise as tolerated      Back pain    Low back pain tolerable with decreased straining is having toforego surgery presently due to husband's health concerns. Can continue pain meds prn      Relevant Medications   HYDROcodone-acetaminophen (NORCO) 10-325 MG tablet   Neuropathic pain of both legs    Has only been gtaking 1 gabapentin daily due to sedation, encouraged to take a second one when sleeping. Gets some relief hydrocodone as well. Given reill, UDS anc contract UTD      Glaucoma    Had surgery at [facility] in December for extreme pressure in left eye. Is improving.      Diabetic retinopathy ([place])    Has a local opthamologist who is preparing to treat her right eye       Other Visit Diagnoses    High risk medication use    -  Primary   Relevant Orders   Pain Mgmt, Profile 8 w/Conf, U      I am having [name]. [name] maintain her metFORMIN, aspirin EC, LANTUS SOLOSTAR, simvastatin, Vitamin D (Ergocalciferol), escitalopram, lisinopril, fluticasone, azelastine, gabapentin, LANTUS SOLOSTAR, meloxicam, silver sulfADIAZINE, furosemide, and HYDROcodone-acetaminophen.  Meds ordered this encounter  Medications  . HYDROcodone-acetaminophen (NORCO) 10-325 MG tablet    Sig: Take 1-2 tablets by mouth every 6 (six) hours as needed (pain).    Dispense:  60 tablet    Refill:  0    CMA served as scribe during this visit. History, Physical and Plan performed by medical provider. Documentation and orders reviewed and attested to.  Penni Homans, MD

## 2017-09-17 NOTE — Assessment & Plan Note (Signed)
Has only been gtaking 1 gabapentin daily due to sedation, encouraged to take a second one when sleeping. Gets some relief hydrocodone as well. Given reill, UDS anc contract UTD

## 2017-09-22 LAB — PAIN MGMT, PROFILE 8 W/CONF, U
6 Acetylmorphine: NEGATIVE ng/mL (ref <10)
Alcohol Metabolites: NEGATIVE ng/mL (ref <500)
Amphetamines: NEGATIVE ng/mL (ref <500)
Benzodiazepines: NEGATIVE ng/mL (ref <100)
Buprenorphine, Urine: NEGATIVE ng/mL (ref <5)
Cocaine Metabolite: NEGATIVE ng/mL (ref <150)
Codeine: NEGATIVE ng/mL (ref <50)
Creatinine: 47.1 mg/dL
Hydrocodone: 59 ng/mL — ABNORMAL HIGH (ref <50)
Hydromorphone: 134 ng/mL — ABNORMAL HIGH (ref <50)
MDMA: NEGATIVE ng/mL (ref <500)
Marijuana Metabolite: NEGATIVE ng/mL (ref <20)
Morphine: NEGATIVE ng/mL (ref <50)
Norhydrocodone: 159 ng/mL — ABNORMAL HIGH (ref <50)
Opiates: POSITIVE ng/mL — AB (ref <100)
Oxidant: NEGATIVE ug/mL (ref <200)
Oxycodone: NEGATIVE ng/mL (ref <100)
pH: 6.66 (ref 4.5–9.0)

## 2017-10-20 ENCOUNTER — Telehealth: Payer: Self-pay | Admitting: Family Medicine

## 2017-10-20 NOTE — Telephone Encounter (Signed)
She can have a prescription for Tizanidine 2 mg tabs, 1 tab po bid prn muscle spasm, disp #30 and we can see if that helps. Come in if worsens

## 2017-10-20 NOTE — Telephone Encounter (Signed)
Copied from [place] [phone]. Topic: Quick Communication - Rx Refill/Question >> Oct 20, 2017  8:40 AM Margot Ables wrote: Medication: requesting a muscle relaxer for back pain - pt husband in hospital for surgery today Has the patient contacted their pharmacy? No. - never prescribed Preferred Pharmacy (with phone number or street name): [id number] - [place], [place] - [name] [phone] (Phone) [phone] (Fax)

## 2017-10-20 NOTE — Telephone Encounter (Signed)
Please advise 

## 2017-10-26 MED ORDER — TIZANIDINE HCL 2 MG PO CAPS: 2.0000 mg | ORAL_CAPSULE | Freq: Two times a day (BID) | ORAL | 0 refills | Status: DC | PRN

## 2017-10-26 NOTE — Telephone Encounter (Signed)
Medication [facility] to [facility]

## 2017-11-04 ENCOUNTER — Other Ambulatory Visit: Payer: Self-pay | Admitting: Family Medicine

## 2017-11-04 NOTE — Telephone Encounter (Signed)
Copied from [place] [id number]. Topic: Quick Communication - Rx Refill/Question >> Nov 04, 2017  1:53 PM Margot Ables wrote: Medication: Hydrocodone - pt is out of medication - she is scheduled for appt 11/17/17 with Dr. Charlett Blake  Has the patient contacted their pharmacy? No. Preferred Pharmacy (with phone number or street name): [id number] - [place], [place] - [name] [phone] (Phone) [phone] (Fax)

## 2017-11-05 MED ORDER — HYDROCODONE-ACETAMINOPHEN 10-325 MG PO TABS: 1.0000 | ORAL_TABLET | Freq: Four times a day (QID) | ORAL | 0 refills | Status: DC | PRN

## 2017-11-05 NOTE — Telephone Encounter (Signed)
Requesting:Norco Contract:yes TVI:[id number] risk next screen 12/17/17 Last OV:09/17/17 Next OV:11/17/17 Last Refill:09/17/17  #[id number] Database:no concerns   Please advise

## 2017-11-05 NOTE — Telephone Encounter (Signed)
Hydrocodone refill LOV: 09/17/17 PCP: Willette Alma MD Pharmacy: [facility] 

## 2017-11-10 ENCOUNTER — Telehealth: Payer: Self-pay | Admitting: Family Medicine

## 2017-11-10 NOTE — Telephone Encounter (Signed)
Pt dropped off FMLA paperwork, documents placed in Dr. Charlett Blake tray at front office

## 2017-11-17 ENCOUNTER — Ambulatory Visit (INDEPENDENT_AMBULATORY_CARE_PROVIDER_SITE_OTHER): Payer: BLUE CROSS/BLUE SHIELD | Admitting: Family Medicine

## 2017-11-17 VITALS — BP 152/84 | HR 77 | Temp 98.0°F | Resp 18 | Wt 163.6 lb

## 2017-11-17 DIAGNOSIS — I679 Cerebrovascular disease, unspecified: Secondary | ICD-10-CM | POA: Diagnosis not present

## 2017-11-17 DIAGNOSIS — I10 Essential (primary) hypertension: Secondary | ICD-10-CM | POA: Diagnosis not present

## 2017-11-17 DIAGNOSIS — G5793 Unspecified mononeuropathy of bilateral lower limbs: Secondary | ICD-10-CM

## 2017-11-17 DIAGNOSIS — G6289 Other specified polyneuropathies: Secondary | ICD-10-CM | POA: Diagnosis not present

## 2017-11-17 DIAGNOSIS — E0842 Diabetes mellitus due to underlying condition with diabetic polyneuropathy: Secondary | ICD-10-CM | POA: Diagnosis not present

## 2017-11-17 DIAGNOSIS — E785 Hyperlipidemia, unspecified: Secondary | ICD-10-CM

## 2017-11-17 LAB — COMPREHENSIVE METABOLIC PANEL
ALT: 23 U/L (ref 0–35)
AST: 18 U/L (ref 0–37)
Albumin: 3.6 g/dL (ref 3.5–5.2)
Alkaline Phosphatase: 91 U/L (ref 39–117)
BUN: 19 mg/dL (ref 6–23)
CO2: 30 mEq/L (ref 19–32)
Calcium: 9.6 mg/dL (ref 8.4–10.5)
Chloride: 103 mEq/L (ref 96–112)
Creatinine, Ser: 0.82 mg/dL (ref 0.40–1.20)
GFR: 77.24 mL/min (ref >60.00)
Glucose, Bld: 105 mg/dL — ABNORMAL HIGH (ref 70–99)
Potassium: 3.9 mEq/L (ref 3.5–5.1)
Sodium: 140 mEq/L (ref 135–145)
Total Bilirubin: 0.3 mg/dL (ref 0.2–1.2)
Total Protein: 7.4 g/dL (ref 6.0–8.3)

## 2017-11-17 LAB — CBC
HCT: 33.9 % — ABNORMAL LOW (ref 36.0–46.0)
Hemoglobin: 11.2 g/dL — ABNORMAL LOW (ref 12.0–15.0)
MCHC: 33 g/dL (ref 30.0–36.0)
MCV: 82.9 fl (ref 78.0–100.0)
Platelets: 417 10*3/uL — ABNORMAL HIGH (ref 150.0–400.0)
RBC: 4.1 Mil/uL (ref 3.87–5.11)
RDW: 15 % (ref 11.5–15.5)
WBC: 8.6 10*3/uL (ref 4.0–10.5)

## 2017-11-17 LAB — LIPID PANEL
Cholesterol: 195 mg/dL (ref 0–200)
HDL: 45.9 mg/dL (ref >39.00)
LDL Cholesterol: 130 mg/dL — ABNORMAL HIGH (ref 0–99)
NonHDL: 149.5
Total CHOL/HDL Ratio: 4
Triglycerides: 98 mg/dL (ref 0.0–149.0)
VLDL: 19.6 mg/dL (ref 0.0–40.0)

## 2017-11-17 LAB — HEMOGLOBIN A1C: Hgb A1c MFr Bld: 9.5 % — ABNORMAL HIGH (ref 4.6–6.5)

## 2017-11-17 LAB — TSH: TSH: 4.99 u[IU]/mL — ABNORMAL HIGH (ref 0.35–4.50)

## 2017-11-17 NOTE — Progress Notes (Signed)
Subjective:  I acted as a Education administrator for Dr. Charlett Blake. [place], [place]  [name], female    DOB: [birth year], 52 y.o.   MRN: [id number]  No chief complaint on file.   HPI  Patient is in today for an acute visit for her neuropathy which she states is getting worse. She just finished a long night at work and her legs are really hurting. She has been taking her gabapentin 300 mg before sleeping.  She does work nights that she takes that in the morning.  She is tolerating that now but when she tried to take gabapentin while awake it caused too much somnolence in the past.  No recent new illness or concerns.  She reports her blood sugars continue to be labile and they have been as low as 86 and as high as 210 when she has checked them recently.  She denies polyuria or polydipsia.  No recent hospitalizations. Denies CP/palp/SOB/HA/congestion/fevers/GI or GU c/o. Taking meds as prescribed  Patient Care Team: Mosie Lukes, MD as PCP - General (Family Medicine) Alda Berthold, DO as Consulting Physician (Neurology) Pieter Partridge, DO as Consulting Physician (Neurology) Josue Hector, MD as Consulting Physician (Cardiology) Ricard Dillon, MD as Consulting Physician (Internal Medicine)   Past Medical History:  Diagnosis Date  . Anxiety   . COMMON MIGRAINE 10/07/2010  . Decreased visual acuity 11/10/2016  . Depression 12/18/2012  . Diabetes mellitus type II, uncontrolled ([place]) 10/07/2010   Qualifier: Diagnosis of  By: Charlett Blake MD, [name]    . Diabetic foot infection ([place]) 08/26/2016  . Disturbance of skin sensation 10/07/2010  . Heart murmur   . History of kidney stones    "years ago"  . Hyperlipidemia 12/06/2010  . Hypertension   . Lipoma of abdominal wall 10/05/2016  . Overweight(278.02) 12/06/2010  . PERIPHERAL NEUROPATHY, FEET 10/07/2010  . PVD (peripheral vascular disease) ([place]) 01/21/2012  . RESTLESS LEG SYNDROME 10/25/2010  . Stroke [facility]) 2014, 2017   most recently in 2/17 -  intracerebral hemorrhage    Past Surgical History:  Procedure Laterality Date  . AMPUTATION TOE Left 09/09/2016   Procedure: AMPUTATION OF LEFT GREAT TOE;  Surgeon: Milly Jakob, MD;  Location: [place];  Service: Orthopedics;  Laterality: Left;  . WISDOM TOOTH EXTRACTION      Family History  Problem Relation Age of Onset  . Arthritis Mother   . Stroke Brother        previous smoker  . Alcohol abuse Brother        in remission  . Leukemia Brother   . Diabetes Paternal Grandmother   . Healthy Son     Social History   Socioeconomic History  . Marital status: Single    Spouse name: Not on file  . Number of children: Not on file  . Years of education: Not on file  . Highest education level: Not on file  Social Needs  . Financial resource strain: Not on file  . Food insecurity - worry: Not on file  . Food insecurity - inability: Not on file  . Transportation needs - medical: Not on file  . Transportation needs - non-medical: Not on file  Occupational History  . Occupation: Glass blower/designer  Tobacco Use  . Smoking status: Former Smoker    Packs/day: 0.50    Last attempt to quit: 09/08/2005    Years since quitting: 12.2  . Smokeless tobacco: Never Used  Substance and Sexual Activity  .  Alcohol use: No    Alcohol/week: 0.0 oz  . Drug use: No  . Sexual activity: Yes    Partners: Male    Birth control/protection: None  Other Topics Concern  . Not on file  Social History Narrative   Lives with husband in a two story home.  Has one child.     Works as a Glass blower/designer.     Education: high school.    Outpatient Medications Prior to Visit  Medication Sig Dispense Refill  . aspirin EC 81 MG tablet Take 81 mg by mouth daily.    Marland Kitchen azelastine (ASTELIN) 0.1 % nasal spray Place 2 sprays into both nostrils 2 (two) times daily. Use in each nostril as directed 30 mL 3  . escitalopram (LEXAPRO) 10 MG tablet Take 1 tablet (10 mg total) by mouth daily. 30 tablet  2  . fluticasone (FLONASE) 50 MCG/ACT nasal spray Place 2 sprays into both nostrils daily. 16 g 1  . furosemide (LASIX) 20 MG tablet TAKE 2 TABS DAILY X5DAYS, TAKE 1 TAB DAILY X5DAYS. THEN TAKE 1 TAB DAILY AS NEEDED FOR EDEMA 35 tablet 1  . gabapentin (NEURONTIN) 100 MG capsule Take 3 capsules (300 mg total) by mouth 3 (three) times daily. 90 capsule 3  . HYDROcodone-acetaminophen (NORCO) 10-325 MG tablet Take 1-2 tablets by mouth every 6 (six) hours as needed (pain). 60 tablet 0  . LANTUS SOLOSTAR 100 UNIT/ML Solostar Pen INJECT 50 UNITS IN AM & 50 UNITS IN PM. INCREASE BY 2 UNITS EVERY 3 DAYS AS LONG AS ABOVE 100  4  . LANTUS SOLOSTAR 100 UNIT/ML Solostar Pen INJECT 34 UNITS IN AM & 32 UNITS IN PM. INCREASE BY 2 UNITS EVERY 3 DAYS AS LONG AS ABOVE 100 15 pen 4  . lisinopril (PRINIVIL,ZESTRIL) 20 MG tablet Take 1 tablet (20 mg total) by mouth 2 (two) times daily. 90 tablet 2  . meloxicam (MOBIC) 7.5 MG tablet TAKE 1 TABLET BY MOUTH EVERY DAY 20 tablet 0  . metFORMIN (GLUCOPHAGE) 1000 MG tablet TAKE 1 TABLET TWICE DAILY AND 1/2 TABLET AT NOON (Patient taking differently: Take 1000mg  every morning; Take 500 mg at lunch; Take 1000 mg at bedtime) 75 tablet 1  . silver sulfADIAZINE (SILVADENE) 1 % cream Apply 1 application topically daily. 50 g 0  . simvastatin (ZOCOR) 10 MG tablet Take 1 tablet (10 mg total) by mouth daily. 30 tablet 3  . tizanidine (ZANAFLEX) 2 MG capsule Take 1 capsule (2 mg total) by mouth 2 (two) times daily as needed for muscle spasms. 30 capsule 0  . Vitamin D, Ergocalciferol, (DRISDOL) 50000 units CAPS capsule Take 1 capsule (50,000 Units total) by mouth every 7 (seven) days. 12 capsule 0   No facility-administered medications prior to visit.     Allergies  Allergen Reactions  . Lyrica [Pregabalin] Other (See Comments)    MADE PATIENT VERY EMOTIONAL AND WOULD CRY EASILY  . Morphine And Related Nausea And Vomiting  . Tramadol Nausea And Vomiting    Pt cant tolerate this  pain med.     Review of Systems  Constitutional: Positive for malaise/fatigue. Negative for fever.  HENT: Negative for congestion.   Eyes: Negative for blurred vision.  Respiratory: Negative for shortness of breath.   Cardiovascular: Negative for chest pain, palpitations and leg swelling.  Gastrointestinal: Negative for abdominal pain, blood in stool and nausea.  Genitourinary: Negative for dysuria and frequency.  Musculoskeletal: Positive for myalgias. Negative for falls.  Skin: Negative for  rash.  Neurological: Positive for tingling and sensory change. Negative for dizziness, loss of consciousness and headaches.  Endo/Heme/Allergies: Negative for environmental allergies.  Psychiatric/Behavioral: Negative for depression. The patient is not nervous/anxious.        Objective:    Physical Exam  Constitutional: She is oriented to person, place, and time. She appears well-developed and well-nourished. No distress.  HENT:  Head: Normocephalic and atraumatic.  Nose: Nose normal.  Eyes: Right eye exhibits no discharge. Left eye exhibits no discharge.  Neck: Normal range of motion. Neck supple.  Cardiovascular: Normal rate and regular rhythm.  Murmur heard. Pulmonary/Chest: Effort normal and breath sounds normal.  Abdominal: Soft. Bowel sounds are normal. There is no tenderness.  Musculoskeletal: She exhibits no edema.  Neurological: She is alert and oriented to person, place, and time.  Skin: Skin is warm and dry.  Psychiatric: She has a normal mood and affect.  Nursing note and vitals reviewed.   BP (!) 152/84   Pulse 77   Temp 98 F (36.7 C) (Oral)   Resp 18   Wt 163 lb 9.6 oz (74.2 kg)   LMP 02/12/2011 (Exact Date)   SpO2 97%   BMI 29.92 kg/m  Wt Readings from Last 3 Encounters:  11/17/17 163 lb 9.6 oz (74.2 kg)  09/17/17 159 lb 6.4 oz (72.3 kg)  08/13/17 161 lb 8 oz (73.3 kg)   BP Readings from Last 3 Encounters:  11/18/17 (!) 152/84  09/17/17 (!) 176/92    08/13/17 140/82     Immunization History  Administered Date(s) Administered  . Influenza Split 05/22/2011, 06/29/2012  . Influenza Whole 05/09/2010  . Influenza,inj,Quad PF,6+ Mos 05/23/2013, 05/30/2014, 09/05/2016, 08/13/2017  . Pneumococcal Polysaccharide-23 08/13/2017  . Td 09/08/2008    Health Maintenance  Topic Date Due  . PAP SMEAR  09/09/1991  . COLONOSCOPY  12/13/2013  . FOOT EXAM  11/10/2017  . OPHTHALMOLOGY EXAM  12/30/2017  . HEMOGLOBIN A1C  05/20/2018  . TETANUS/TDAP  09/08/2018  . MAMMOGRAM  11/27/2018  . PNEUMOCOCCAL POLYSACCHARIDE VACCINE (2) 08/13/2022  . INFLUENZA VACCINE  Completed  . Hepatitis C Screening  Completed  . HIV Screening  Completed    Lab Results  Component Value Date   WBC 8.6 11/17/2017   HGB 11.2 (L) 11/17/2017   HCT 33.9 (L) 11/17/2017   PLT 417.0 (H) 11/17/2017   GLUCOSE 105 (H) 11/17/2017   CHOL 195 11/17/2017   TRIG 98.0 11/17/2017   HDL 45.90 11/17/2017   LDLDIRECT 133.0 03/02/2017   LDLCALC 130 (H) 11/17/2017   ALT 23 11/17/2017   AST 18 11/17/2017   NA 140 11/17/2017   K 3.9 11/17/2017   CL 103 11/17/2017   CREATININE 0.82 11/17/2017   BUN 19 11/17/2017   CO2 30 11/17/2017   TSH 4.99 (H) 11/17/2017   INR 1.07 03/30/2016   HGBA1C 9.5 (H) 11/17/2017   MICROALBUR 174.3 (H) 08/14/2017    Lab Results  Component Value Date   TSH 4.99 (H) 11/17/2017   Lab Results  Component Value Date   WBC 8.6 11/17/2017   HGB 11.2 (L) 11/17/2017   HCT 33.9 (L) 11/17/2017   MCV 82.9 11/17/2017   PLT 417.0 (H) 11/17/2017   Lab Results  Component Value Date   NA 140 11/17/2017   K 3.9 11/17/2017   CO2 30 11/17/2017   GLUCOSE 105 (H) 11/17/2017   BUN 19 11/17/2017   CREATININE 0.82 11/17/2017   BILITOT 0.3 11/17/2017   ALKPHOS 91 11/17/2017  AST 18 11/17/2017   ALT 23 11/17/2017   PROT 7.4 11/17/2017   ALBUMIN 3.6 11/17/2017   CALCIUM 9.6 11/17/2017   ANIONGAP 7 05/12/2017   GFR 77.24 11/17/2017   Lab Results   Component Value Date   CHOL 195 11/17/2017   Lab Results  Component Value Date   HDL 45.90 11/17/2017   Lab Results  Component Value Date   LDLCALC 130 (H) 11/17/2017   Lab Results  Component Value Date   TRIG 98.0 11/17/2017   Lab Results  Component Value Date   CHOLHDL 4 11/17/2017   Lab Results  Component Value Date   HGBA1C 9.5 (H) 11/17/2017         Assessment & Plan:   Problem List Items Addressed This Visit    Essential hypertension, benign    elevated but just got off of working all night and did not take her meds this am no changes to meds. Encouraged heart healthy diet such as the DASH diet and exercise as tolerated.       Relevant Orders   CBC (Completed)   Comprehensive metabolic panel (Completed)   TSH (Completed)   Hyperlipidemia    Encouraged heart healthy diet, increase exercise, avoid trans fats, consider a krill oil cap daily      Relevant Orders   Lipid panel (Completed)   Neuropathic pain of both legs    Continues to struggle with significant pain in her legs.  She tolerates gabapentin 300 mg at bedtime in the past when she is tried to take the doses during the day her side effects are notable.  She has significant hypersomnia.  She does have a week off soon and she is encouraged to try taking gabapentin 100 mg in the morning and another 100 midday to see if she tolerates it now that she is used to the 300 mg dose.  She is also encouraged to try rubbing her legs down with capsaicin twice daily if she does not tolerate that consider menthol gel twice daily.      Cerebrovascular disease    No recent flares.       Diabetic polyneuropathy associated with diabetes mellitus due to underlying condition ([place])    A1C has increased again. She will increase lantus by 2 units and have her monitor her sugars closely let us know if they continue to run high.       Relevant Orders   Hemoglobin A1c (Completed)    Other Visit Diagnoses    Other  polyneuropathy    -  Primary      I am having Tiffany Duby. Davis maintain her metFORMIN, aspirin EC, LANTUS SOLOSTAR, simvastatin, Vitamin D (Ergocalciferol), escitalopram, lisinopril, fluticasone, azelastine, gabapentin, LANTUS SOLOSTAR, meloxicam, silver sulfADIAZINE, furosemide, tizanidine, and HYDROcodone-acetaminophen.  No orders of the defined types were placed in this encounter.   CMA served as Education administrator during this visit. History, Physical and Plan performed by medical provider. Documentation and orders reviewed and attested to.  Penni Homans, MD

## 2017-11-17 NOTE — Patient Instructions (Signed)
Capsaicin cream regularly will diminish sense of pain and/or menthol based cream Attempt to increase Gabapentin to 3 times daily at low doses Carbohydrate Counting for Diabetes Mellitus, Adult Carbohydrate counting is a method for keeping track of how many carbohydrates you eat. Eating carbohydrates naturally increases the amount of sugar (glucose) in the blood. Counting how many carbohydrates you eat helps keep your blood glucose within normal limits, which helps you manage your diabetes (diabetes mellitus). It is important to know how many carbohydrates you can safely have in each meal. This is different for every person. A diet and nutrition specialist (registered dietitian) can help you make a meal plan and calculate how many carbohydrates you should have at each meal and snack. Carbohydrates are found in the following foods:  Grains, such as breads and cereals.  Dried beans and soy products.  Starchy vegetables, such as potatoes, peas, and corn.  Fruit and fruit juices.  Milk and yogurt.  Sweets and snack foods, such as cake, cookies, candy, chips, and soft drinks.  How do I count carbohydrates? There are two ways to count carbohydrates in food. You can use either of the methods or a combination of both. Reading "Nutrition Facts" on packaged food The "Nutrition Facts" list is included on the labels of almost all packaged foods and beverages in the [place] It includes:  The serving size.  Information about nutrients in each serving, including the grams (g) of carbohydrate per serving.  To use the "Nutrition Facts":  Decide how many servings you will have.  Multiply the number of servings by the number of carbohydrates per serving.  The resulting number is the total amount of carbohydrates that you will be having.  Learning standard serving sizes of other foods When you eat foods containing carbohydrates that are not packaged or do not include "Nutrition Facts" on the label, you  need to measure the servings in order to count the amount of carbohydrates:  Measure the foods that you will eat with a food scale or measuring cup, if needed.  Decide how many standard-size servings you will eat.  Multiply the number of servings by 15. Most carbohydrate-rich foods have about 15 g of carbohydrates per serving. ? For example, if you eat 8 oz (170 g) of strawberries, you will have eaten 2 servings and 30 g of carbohydrates (2 servings x 15 g = 30 g).  For foods that have more than one food mixed, such as soups and casseroles, you must count the carbohydrates in each food that is included.  The following list contains standard serving sizes of common carbohydrate-rich foods. Each of these servings has about 15 g of carbohydrates:   hamburger bun or  English muffin.   oz (15 mL) syrup.   oz (14 g) jelly.  1 slice of bread.  1 six-inch tortilla.  3 oz (85 g) cooked rice or pasta.  4 oz (113 g) cooked dried beans.  4 oz (113 g) starchy vegetable, such as peas, corn, or potatoes.  4 oz (113 g) hot cereal.  4 oz (113 g) mashed potatoes or  of a large baked potato.  4 oz (113 g) canned or frozen fruit.  4 oz (120 mL) fruit juice.  4-6 crackers.  6 chicken nuggets.  6 oz (170 g) unsweetened dry cereal.  6 oz (170 g) plain fat-free yogurt or yogurt sweetened with artificial sweeteners.  8 oz (240 mL) milk.  8 oz (170 g) fresh fruit or one small piece  of fruit.  24 oz (680 g) popped popcorn.  Example of carbohydrate counting Sample meal  3 oz (85 g) chicken breast.  6 oz (170 g) brown rice.  4 oz (113 g) corn.  8 oz (240 mL) milk.  8 oz (170 g) strawberries with sugar-free whipped topping. Carbohydrate calculation 1. Identify the foods that contain carbohydrates: ? Rice. ? Corn. ? Milk. ? Strawberries. 2. Calculate how many servings you have of each food: ? 2 servings rice. ? 1 serving corn. ? 1 serving milk. ? 1 serving  strawberries. 3. Multiply each number of servings by 15 g: ? 2 servings rice x 15 g = 30 g. ? 1 serving corn x 15 g = 15 g. ? 1 serving milk x 15 g = 15 g. ? 1 serving strawberries x 15 g = 15 g. 4. Add together all of the amounts to find the total grams of carbohydrates eaten: ? 30 g + 15 g + 15 g + 15 g = 75 g of carbohydrates total. This information is not intended to replace advice given to you by your health care provider. Make sure you discuss any questions you have with your health care provider. Document Released: 08/25/2005 Document Revised: 03/14/2016 Document Reviewed: 02/06/2016 Elsevier Interactive Patient Education  [employer].

## 2017-11-17 NOTE — Assessment & Plan Note (Signed)
Encouraged heart healthy diet, increase exercise, avoid trans fats, consider a krill oil cap daily 

## 2017-11-17 NOTE — Progress Notes (Signed)
HPI: Patient is in today for a medication follow up and worsening peripheral neuropathy. She is continuing to work nights and care for her husband with metastatic prostate cancer who recently had two orthopedic surgeries. No recent febrile illness or hospitalizations. Sugars have been stable. Denies CP/palp/SOB/HA/congestion/fevers/GI or GU c/o. Taking meds as prescribed.

## 2017-11-17 NOTE — Assessment & Plan Note (Addendum)
elevated but just got off of working all night and did not take her meds this am no changes to meds. Encouraged heart healthy diet such as the DASH diet and exercise as tolerated.

## 2017-11-17 NOTE — Telephone Encounter (Signed)
Completed as much as possible; forwarded to provider/SLS 03/12

## 2017-11-18 NOTE — Assessment & Plan Note (Signed)
A1C has increased again. She will increase lantus by 2 units and have her monitor her sugars closely let us know if they continue to run high.

## 2017-11-18 NOTE — Assessment & Plan Note (Signed)
Continues to struggle with significant pain in her legs.  She tolerates gabapentin 300 mg at bedtime in the past when she is tried to take the doses during the day her side effects are notable.  She has significant hypersomnia.  She does have a week off soon and she is encouraged to try taking gabapentin 100 mg in the morning and another 100 midday to see if she tolerates it now that she is used to the 300 mg dose.  She is also encouraged to try rubbing her legs down with capsaicin twice daily if she does not tolerate that consider menthol gel twice daily.

## 2017-11-18 NOTE — Assessment & Plan Note (Signed)
No recent flares 

## 2017-11-19 NOTE — Telephone Encounter (Signed)
Patient informed FMLA forms completed, faxed & copy put in the outgoing mail to her; understood & agreed/SLS 03/14

## 2017-11-20 MED ORDER — LEVOTHYROXINE SODIUM 25 MCG PO TABS: 25.0000 ug | ORAL_TABLET | Freq: Every day | ORAL | 1 refills | Status: DC

## 2017-11-20 MED ORDER — INSULIN GLARGINE 100 UNIT/ML SOLOSTAR PEN: PEN_INJECTOR | SUBCUTANEOUS | 4 refills | Status: DC

## 2017-11-20 NOTE — Addendum Note (Signed)
Addended by: Magdalene Molly A on: 11/20/2017 08:46 AM   Modules accepted: Orders

## 2017-12-02 ENCOUNTER — Other Ambulatory Visit: Payer: Self-pay

## 2017-12-02 MED ORDER — TIZANIDINE HCL 2 MG PO CAPS: 2.0000 mg | ORAL_CAPSULE | Freq: Two times a day (BID) | ORAL | 0 refills | Status: DC | PRN

## 2017-12-30 IMAGING — MR MR LUMBAR SPINE W/O CM
4 of 5 series · 19 of 48 positions shown · non-contrast
Comparison: 12/24/2013 plain film exam lumbar spine. No comparison
MR.

CLINICAL DATA: 50-year-old female with low back pain. Left hip and
thigh pain. Right buttock pain. Symptoms for 3 weeks. No known
injury. No history of cancer. Initial encounter.

EXAM:
MRI LUMBAR SPINE WITHOUT CONTRAST
TECHNIQUE: Multiplanar, multisequence MR imaging of the lumbar spine was
performed. No intravenous contrast was administered.

[Series 6: T2 · sagittal · 4.0mm · 0.73mm/px · 7 of 15 slices shown (1 of 2)]
[im 1/15]
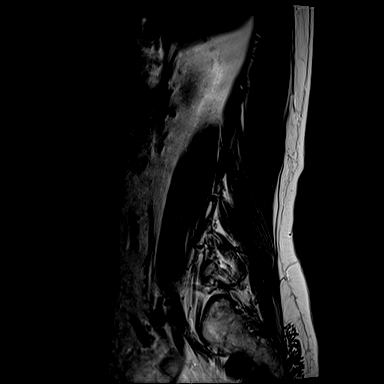
[im 3/15]
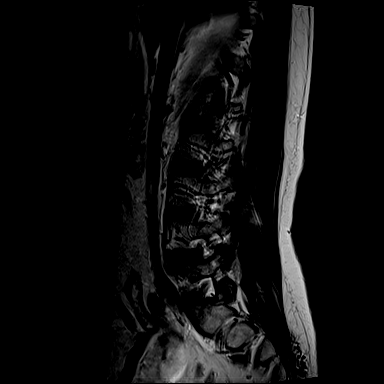
[im 5/15]
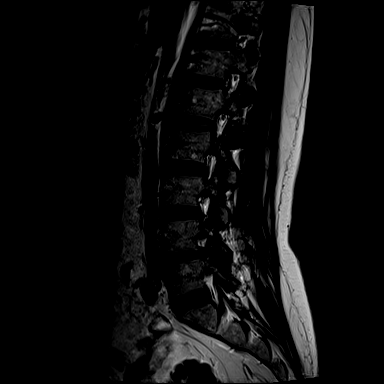
[im 8/15]
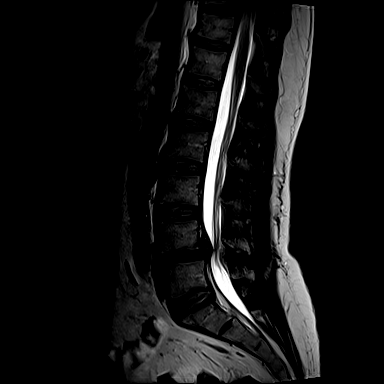
[im 10/15]
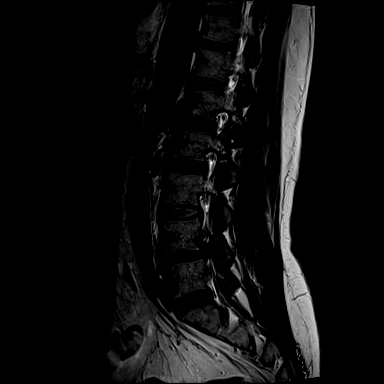
[im 12/15]
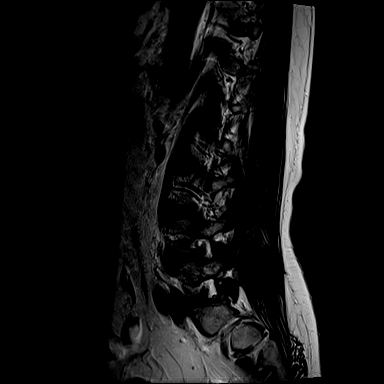
[im 15/15]
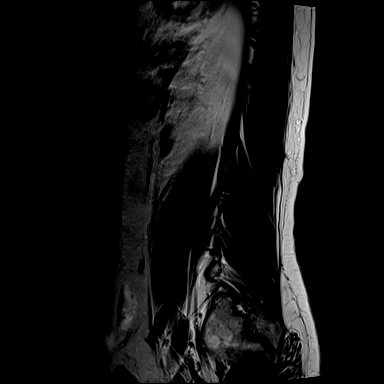

[Series 7: T1 · sagittal · 4.0mm · 0.73mm/px · 3 of 15 slices shown (1 of 2)]
[im 3/15]
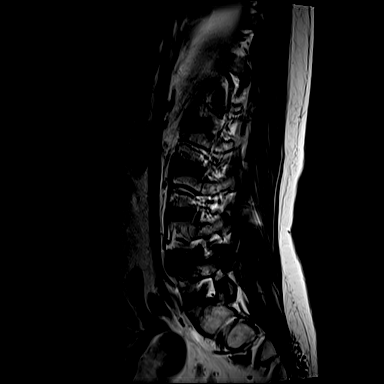
[im 8/15]
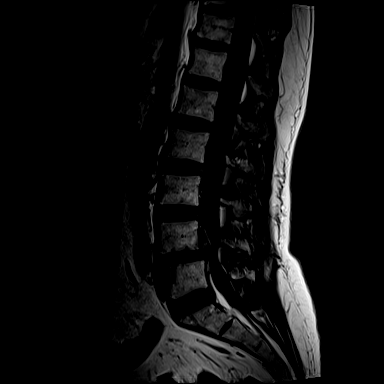
[im 12/15]
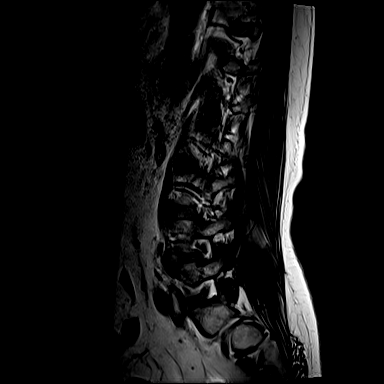

[Series 11: T1 · axial · 4.0mm · 0.28mm/px · z∈[-65,+63]mm · 3 of 33 slices shown (2 of 2)]
[im 5/33]
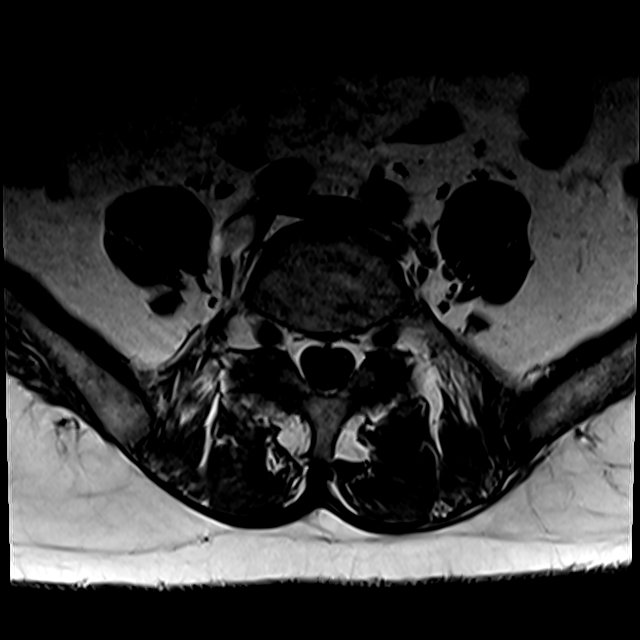
[im 18/33]
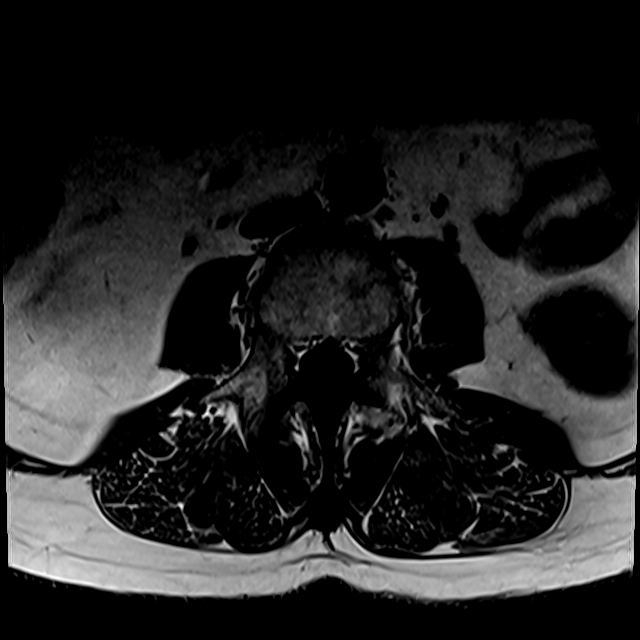
[im 28/33]
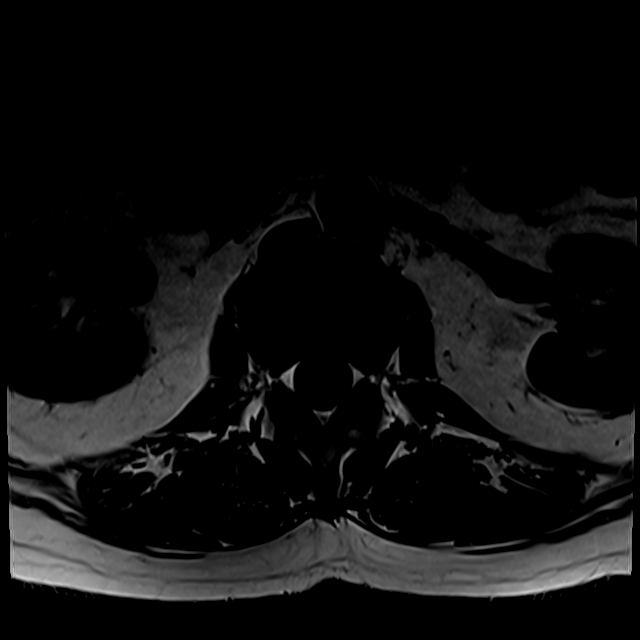

[Series 14: T2 · axial · 4.0mm · 0.28mm/px · z∈[-85,+63]mm · 6 of 33 slices shown (2 of 2)]
[im 1/33]
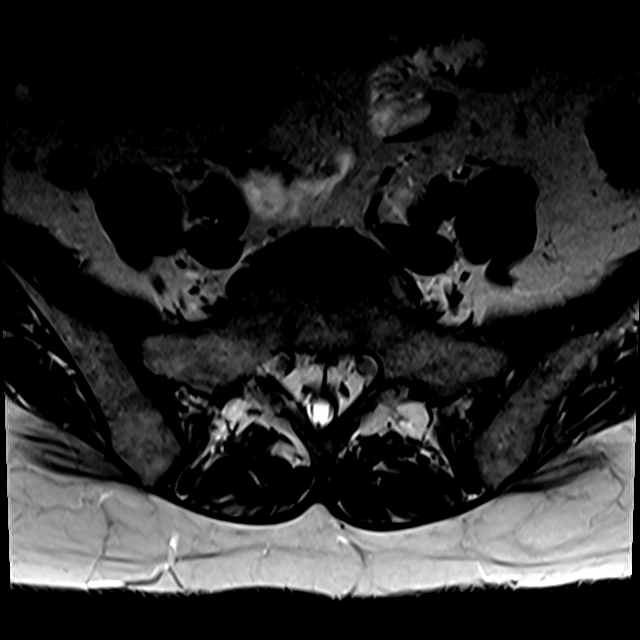
[im 5/33]
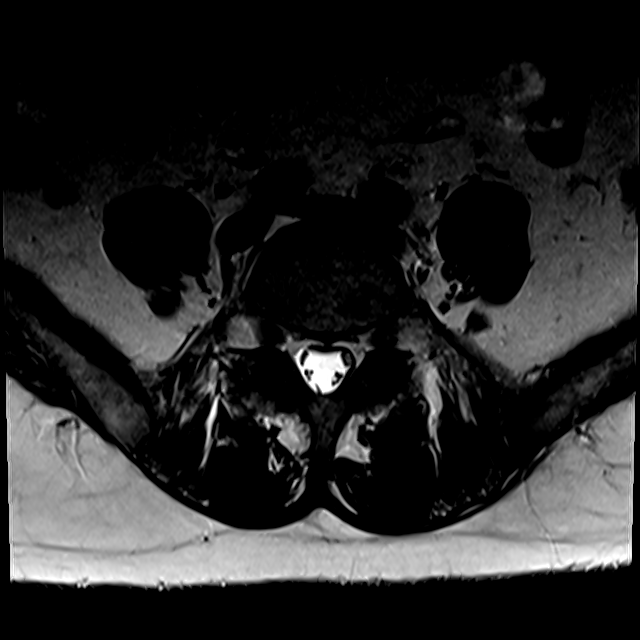
[im 10/33]
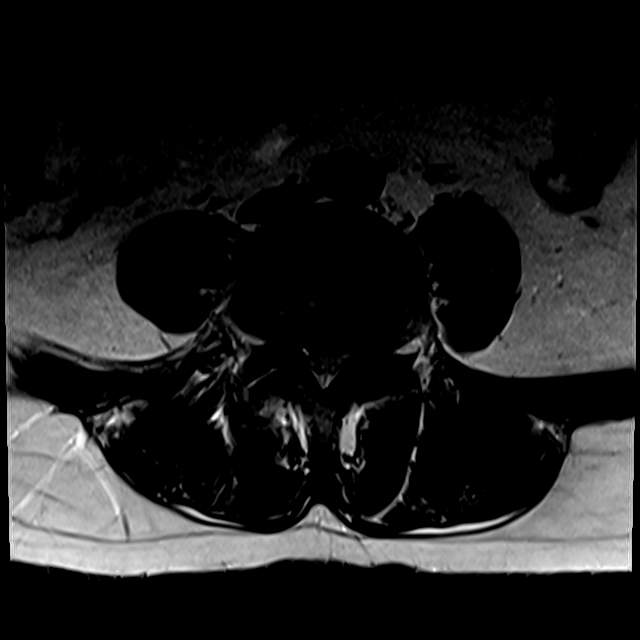
[im 15/33]
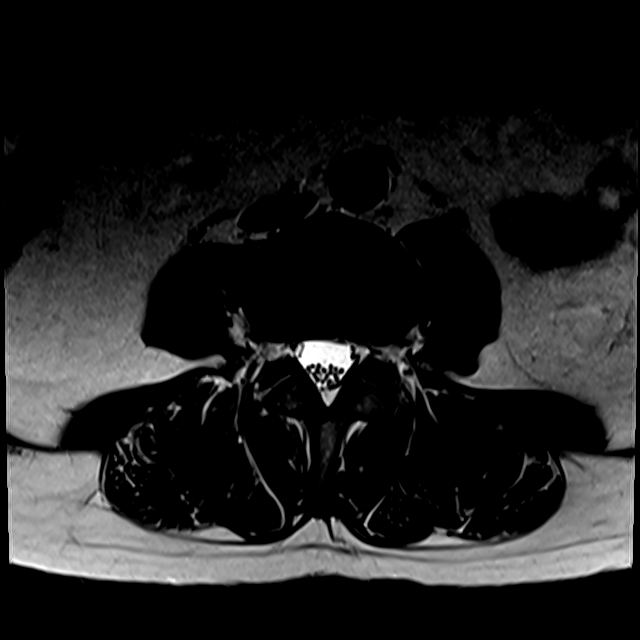
[im 18/33]
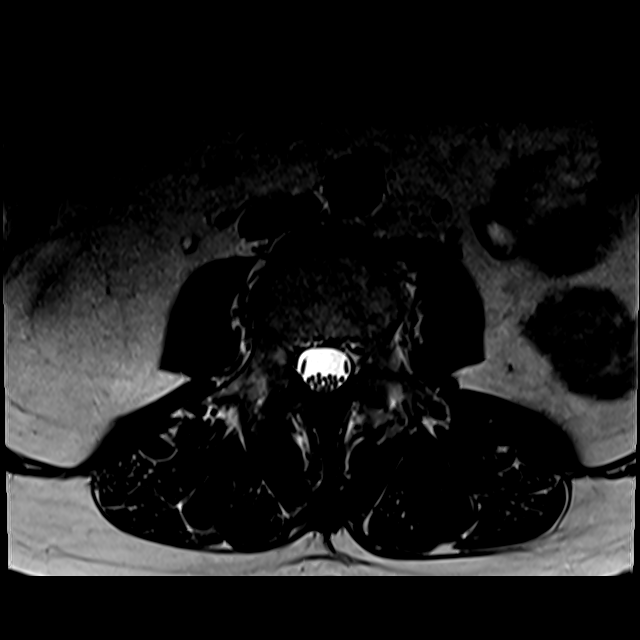
[im 28/33]
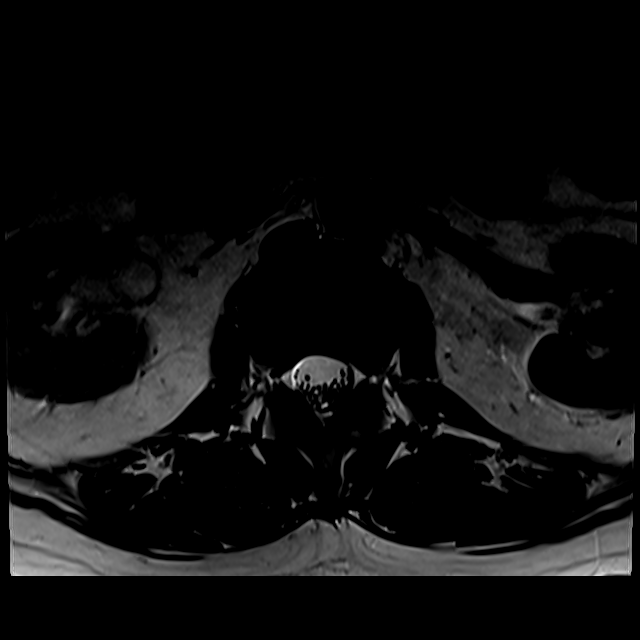

[19 of 48 positions shown; findings below may reference images not displayed]

FINDINGS: Segmentation: Last fully open disk space is labeled L5-S1. Present
examination incorporates from T10-11 disc space through the lower
sacrum.

Alignment:  Mild anterior slip L4.  Minimal curvature lumbar spine.

Vertebrae: Right L4-5 facet degenerative changes with bone edema
extending into the pedicles. Mild soft tissue edema. No other
findings to suggest septic arthropathy.

Conus medullaris: Extends to the L1 level and appears normal.

Paraspinal and other soft tissues: Right extra renal type pelvis
incidentally noted.

Disc levels:

T10-11 through L3-4 unremarkable.

L4-5: Moderate facet degenerative changes greater on the right.
Ligamentum flavum hypertrophy. 2.4 mm anterior slip L4. Disc
degeneration with disc space narrowing. Moderately large broad-based
protrusion with cephalad extension. The cephalad extension of
extruded disc is greater on the left extending medial to the left L4
pedicle compressing the left lateral aspect of the thecal sac and
contained left L4 nerve root. Just below the L4 pedicle level,
multifactorial moderate to marked spinal stenosis. Multifactorial
moderate to marked bilateral lateral recess stenosis. Mild to
moderate bilateral foraminal narrowing

L5-S1: Mild facet degenerative changes. No significant spinal
stenosis or foraminal narrowing.
IMPRESSION: L4-5 moderately large broad-based protrusion with cephalad extension
greater on the left extending medial to the left L4 pedicle
compressing the left lateral aspect of the thecal sac and contained
left L4 nerve root. Moderate facet degenerative changes greater on
the right. Ligamentum flavum hypertrophy. 2.4 mm anterior slip L4.
Just below the L4 pedicle level, multifactorial moderate to marked
spinal stenosis. Multifactorial moderate to marked bilateral lateral
recess stenosis. Mild to moderate bilateral foraminal narrowing.

## 2018-01-05 ENCOUNTER — Telehealth: Payer: Self-pay | Admitting: Family Medicine

## 2018-01-05 NOTE — Telephone Encounter (Signed)
Copied from [place] [id number]. Topic: Quick Communication - Rx Refill/Question >> Jan 05, 2018 12:20 PM Neva Seat wrote: Insulin Glargine (LANTUS SOLOSTAR) 100 UNIT/ML Solostar Pen  Pt states insurance will not pay for her Rx thru [facility]. Pt states insurance will pay if [facility] is sent to [facility] for [facility] day supply. Pt is now out - please fill asap  [id number] [place], [place] - [name] [place] [address] Phone: [phone] Fax: [phone]

## 2018-01-05 NOTE — Telephone Encounter (Signed)
Spoke with [name] at [facility] regarding refills of Lantus Solostar Pen. [name] states currently the prescription still has refills  and it would cost the pt $419.17. Pharmacy states that the pt has enough refills to run prescription as a 90 day supply( due to insurance), which would cost the pt $100.

## 2018-02-03 ENCOUNTER — Telehealth: Payer: Self-pay | Admitting: Family Medicine

## 2018-02-03 MED ORDER — INSULIN GLARGINE 100 UNIT/ML SOLOSTAR PEN: PEN_INJECTOR | SUBCUTANEOUS | 4 refills | Status: DC

## 2018-02-03 NOTE — Telephone Encounter (Signed)
Copied from [place] [id number]. Topic: Quick Communication - See Telephone Encounter >> Feb 03, 2018 11:06 AM Hewitt Shorts wrote: Pt is needing a refill on her lantus [facility] [place] [phone]  Best number [phone]

## 2018-02-27 ENCOUNTER — Emergency Department (HOSPITAL_BASED_OUTPATIENT_CLINIC_OR_DEPARTMENT_OTHER)
Admission: EM | Admit: 2018-02-27 | Discharge: 2018-02-27 | Disposition: A | Discharge status: Home / Self Care | Payer: BLUE CROSS/BLUE SHIELD | Attending: Emergency Medicine | Admitting: Emergency Medicine

## 2018-02-27 ENCOUNTER — Encounter (HOSPITAL_BASED_OUTPATIENT_CLINIC_OR_DEPARTMENT_OTHER): Payer: Self-pay | Admitting: Emergency Medicine

## 2018-02-27 ENCOUNTER — Other Ambulatory Visit: Payer: Self-pay

## 2018-02-27 DIAGNOSIS — Z7982 Long term (current) use of aspirin: Secondary | ICD-10-CM | POA: Insufficient documentation

## 2018-02-27 DIAGNOSIS — Z87891 Personal history of nicotine dependence: Secondary | ICD-10-CM | POA: Insufficient documentation

## 2018-02-27 DIAGNOSIS — I10 Essential (primary) hypertension: Secondary | ICD-10-CM | POA: Diagnosis not present

## 2018-02-27 DIAGNOSIS — Z79899 Other long term (current) drug therapy: Secondary | ICD-10-CM | POA: Insufficient documentation

## 2018-02-27 DIAGNOSIS — I160 Hypertensive urgency: Secondary | ICD-10-CM

## 2018-02-27 DIAGNOSIS — H9202 Otalgia, left ear: Secondary | ICD-10-CM | POA: Diagnosis not present

## 2018-02-27 DIAGNOSIS — J069 Acute upper respiratory infection, unspecified: Secondary | ICD-10-CM

## 2018-02-27 DIAGNOSIS — Z794 Long term (current) use of insulin: Secondary | ICD-10-CM | POA: Insufficient documentation

## 2018-02-27 DIAGNOSIS — E119 Type 2 diabetes mellitus without complications: Secondary | ICD-10-CM | POA: Insufficient documentation

## 2018-02-27 DIAGNOSIS — Z8673 Personal history of transient ischemic attack (TIA), and cerebral infarction without residual deficits: Secondary | ICD-10-CM | POA: Insufficient documentation

## 2018-02-27 NOTE — ED Triage Notes (Signed)
Left ear pain x 2 days, worse this am.

## 2018-02-27 NOTE — Discharge Instructions (Signed)
Consult with the pharmacist when getting medicines to treat your upper respiratory infection.  Do not take certain medicines such as Sudafed as this will further raise your blood pressure.  If you develop a severe headache, chest pain, weakness or numbness in arms or legs, blurry vision, or other new/concerning symptoms then return to the ER for evaluation.  Follow-up closely with your doctor for better blood pressure control.

## 2018-02-27 NOTE — ED Provider Notes (Signed)
[facility] EMERGENCY DEPARTMENT Provider Note   CSN: [id number] Arrival date & time: 02/27/18  2633     History   Chief Complaint Chief Complaint  Patient presents with  . Otalgia    HPI [name] is a 52 y.o. female.  HPI  52 year old female presents with severe left ear pain.  Patient has hypertension, diabetes and started having a cough 2 days ago.  Has had nasal congestion as well.  Yesterday started developing a left earache but this morning was much more severe.  She had some leftover eardrops and tried these without relief.  She is also tried Mucinex.  The ear pain is giving her a little bit of a left-sided headache.  There is no jaw pain, chest pain, shortness of breath.  She has been having a mildly productive cough with some white sputum.  She has not had any sore throat.  No blurry vision.  She did not take her medicines this morning.  Past Medical History:  Diagnosis Date  . Anxiety   . COMMON MIGRAINE 10/07/2010  . Decreased visual acuity 11/10/2016  . Depression 12/18/2012  . Diabetes mellitus type II, uncontrolled ([place]) 10/07/2010   Qualifier: Diagnosis of  By: Charlett Blake MD, [name]    . Diabetic foot infection ([place]) 08/26/2016  . Disturbance of skin sensation 10/07/2010  . Heart murmur   . History of kidney stones    "years ago"  . Hyperlipidemia 12/06/2010  . Hypertension   . Lipoma of abdominal wall 10/05/2016  . Overweight(278.02) 12/06/2010  . PERIPHERAL NEUROPATHY, FEET 10/07/2010  . PVD (peripheral vascular disease) ([place]) 01/21/2012  . RESTLESS LEG SYNDROME 10/25/2010  . Stroke [facility]) 2014, 2017   most recently in 2/17 - intracerebral hemorrhage    Patient Active Problem List   Diagnosis Date Noted  . Glaucoma 09/17/2017  . Diabetic retinopathy ([place]) 09/17/2017  . Otitis media 11/10/2016  . Decreased visual acuity 11/10/2016  . Spinal stenosis, lumbar region with neurogenic claudication 10/22/2016  . Other spondylosis with radiculopathy,  lumbar region 10/13/2016  . Lipoma of abdominal wall 10/05/2016  . Acute osteomyelitis of toe, left ([place]) 09/05/2016  . Moderate malnutrition ([place]) 09/04/2016  . Anemia of chronic disease 09/04/2016  . Diabetic foot infection ([place]) 08/26/2016  . Diabetic polyneuropathy associated with diabetes mellitus due to underlying condition ([place]) 05/07/2016  . Orthostatic hypotension 05/07/2016  . Cerebrovascular disease 04/27/2016  . ICH (intracerebral hemorrhage) ([place]) 03/30/2016  . Visit for preventive health examination 05/21/2015  . Breast cancer screening 05/21/2015  . Arm lesion 07/02/2014  . Pain in joint, ankle and foot 04/28/2014  . Pain in limb 01/13/2014  . Neuropathic pain of both legs 01/13/2014  . Hip pain 01/12/2014  . Allergic rhinitis 03/10/2013  . Back pain 03/08/2013  . Depression 12/18/2012  . PVD (peripheral vascular disease) ([place]) 01/21/2012  . Wound of left leg 12/22/2011  . Hyperlipidemia 12/06/2010  . Overweight(278.02) 12/06/2010  . RESTLESS LEG SYNDROME 10/25/2010  . Migraine without aura 10/07/2010  . Hereditary and idiopathic peripheral neuropathy 10/07/2010  . Essential hypertension, benign 10/07/2010  . DISTURBANCE OF SKIN SENSATION 10/07/2010  . HEART MURMUR, HX OF 10/07/2010  . NEPHROLITHIASIS, HX OF 10/07/2010    Past Surgical History:  Procedure Laterality Date  . AMPUTATION TOE Left 09/09/2016   Procedure: AMPUTATION OF LEFT GREAT TOE;  Surgeon: Milly Jakob, MD;  Location: [facility];  Service: Orthopedics;  Laterality: Left;  . WISDOM TOOTH EXTRACTION  OB History   None      Home Medications    Prior to Admission medications   Medication Sig Start Date End Date Taking? Authorizing Provider  escitalopram (LEXAPRO) 10 MG tablet Take by mouth. 11/10/16  Yes [provider]  fluticasone (FLONASE) 50 MCG/ACT nasal spray Place into the nose. 11/19/16  Yes [provider]  furosemide (LASIX) 20 MG tablet 2  tabs po daily x 5 days then 1 tab po daily x 5 dasy then 1 daily as needed for pedal edema and weight gain greater than 3# in 24 hours. 05/04/17  Yes [provider]  Insulin Glargine (LANTUS SOLOSTAR) 100 UNIT/ML Solostar Pen INJECT 34 UNITS IN AM & 32 UNITS IN PM. INCREASE BY 2 UNITS EVERY 3 DAYS AS LONG AS ABOVE 100 09/29/16  Yes [provider]  lisinopril (PRINIVIL,ZESTRIL) 20 MG tablet Take by mouth. 11/10/16  Yes [provider]  meloxicam (MOBIC) 7.5 MG tablet TAKE 1 TABLET BY MOUTH EVERY DAY 06/02/17  Yes [provider]  simvastatin (ZOCOR) 10 MG tablet Take by mouth. 10/14/16  Yes [provider]  aspirin EC 81 MG tablet Take 81 mg by mouth daily.    [provider]  azelastine (ASTELIN) 0.1 % nasal spray Place 2 sprays into both nostrils 2 (two) times daily. Use in each nostril as directed 11/19/16   Saguier, Percell Miller, PA-C  escitalopram (LEXAPRO) 10 MG tablet Take 1 tablet (10 mg total) by mouth daily. 11/10/16   Mosie Lukes, MD  fluticasone (FLONASE) 50 MCG/ACT nasal spray Place 2 sprays into both nostrils daily. 11/19/16   Saguier, Percell Miller, PA-C  furosemide (LASIX) 20 MG tablet TAKE 2 TABS DAILY X5DAYS, TAKE 1 TAB DAILY X5DAYS. THEN TAKE 1 TAB DAILY AS NEEDED FOR EDEMA 08/24/17   Mosie Lukes, MD  gabapentin (NEURONTIN) 100 MG capsule Take 3 capsules (300 mg total) by mouth 3 (three) times daily. 03/02/17   Mosie Lukes, MD  HYDROcodone-acetaminophen (NORCO) 10-325 MG tablet Take 1-2 tablets by mouth every 6 (six) hours as needed (pain). 11/05/17   Mosie Lukes, MD  Insulin Glargine (LANTUS SOLOSTAR) 100 UNIT/ML Solostar Pen INJECT 34 UNITS IN AM & 32 UNITS IN PM. INCREASE BY 2 UNITS EVERY 3 DAYS AS LONG AS ABOVE 100 02/03/18   Mosie Lukes, MD  LANTUS SOLOSTAR 100 UNIT/ML Solostar Pen INJECT 50 UNITS IN AM & 50 UNITS IN PM. INCREASE BY 2 UNITS EVERY 3 DAYS AS LONG AS ABOVE 100 09/29/16   [provider]  levothyroxine  (SYNTHROID, LEVOTHROID) 25 MCG tablet Take 1 tablet (25 mcg total) by mouth daily before breakfast. 11/20/17   Mosie Lukes, MD  lisinopril (PRINIVIL,ZESTRIL) 20 MG tablet Take 1 tablet (20 mg total) by mouth 2 (two) times daily. 11/10/16   Mosie Lukes, MD  meloxicam (MOBIC) 7.5 MG tablet TAKE 1 TABLET BY MOUTH EVERY DAY 08/05/17   Mosie Lukes, MD  metFORMIN (GLUCOPHAGE) 1000 MG tablet TAKE 1 TABLET TWICE DAILY AND 1/2 TABLET AT NOON Patient taking differently: Take 1000mg  every morning; Take 500 mg at lunch; Take 1000 mg at bedtime 04/01/16   Mosie Lukes, MD  ONE TOUCH ULTRA TEST test strip USE ONCE DAILY TO CHECK BLOOD SUGAR. DX E11.9 02/08/18   [provider]  silver sulfADIAZINE (SILVADENE) 1 % cream Apply 1 application topically daily. 08/13/17   Shelda Pal, DO  simvastatin (ZOCOR) 10 MG tablet Take 1 tablet (10 mg  total) by mouth daily. 10/14/16   Mosie Lukes, MD  tizanidine (ZANAFLEX) 2 MG capsule Take 1 capsule (2 mg total) by mouth 2 (two) times daily as needed for muscle spasms. 12/02/17   Mosie Lukes, MD  Vitamin D, Ergocalciferol, (DRISDOL) 50000 units CAPS capsule Take 1 capsule (50,000 Units total) by mouth every 7 (seven) days. 10/22/16   Lyndal Pulley, DO    Family History Family History  Problem Relation Age of Onset  . Arthritis Mother   . Stroke Brother        previous smoker  . Alcohol abuse Brother        in remission  . Leukemia Brother   . Diabetes Paternal Grandmother   . Healthy Son     Social History Social History   Tobacco Use  . Smoking status: Former Smoker    Packs/day: 0.50    Last attempt to quit: 09/08/2005    Years since quitting: 12.4  . Smokeless tobacco: Never Used  Substance Use Topics  . Alcohol use: No    Alcohol/week: 0.0 oz  . Drug use: No     Allergies   Lyrica [pregabalin]; Morphine and related; and Tramadol   Review of Systems Review of Systems  Constitutional: Negative for fever.  HENT:  Positive for congestion, ear pain and sneezing. Negative for sore throat.   Eyes: Negative for visual disturbance.  Respiratory: Positive for cough. Negative for shortness of breath.   Gastrointestinal: Negative for abdominal pain and vomiting.  Neurological: Positive for headaches (mild). Negative for weakness and numbness.  All other systems reviewed and are negative.    Physical Exam Updated Vital Signs BP (!) 208/93 (BP Location: Right Arm)   Pulse 86   Temp 98.2 F (36.8 C) (Oral)   Resp 20   Ht 5\' 2"  (1.575 m)   Wt 72.6 kg (160 lb)   LMP 02/12/2011 (Exact Date)   SpO2 98%   BMI 29.26 kg/m   Physical Exam  Constitutional: She is oriented to person, place, and time. She appears well-developed and well-nourished.  HENT:  Head: Normocephalic and atraumatic.  Right Ear: Tympanic membrane, external ear and ear canal normal. No tenderness.  Left Ear: Tympanic membrane, external ear and ear canal normal. No tenderness.  Nose: Nose normal.  Mouth/Throat: No oropharyngeal exudate.  No tenderness with movement of pinna.  Mild discomfort during the exam but no obvious canal abnormality such as otitis externa.  Clear TM with light reflex without obvious fluid or infection  Eyes: Right eye exhibits no discharge. Left eye exhibits no discharge.  Neck: Neck supple.  Cardiovascular: Normal rate and regular rhythm.  Murmur (slight) heard. Pulmonary/Chest: Effort normal and breath sounds normal.  Abdominal: Soft. There is no tenderness.  Neurological: She is alert and oriented to person, place, and time.  Skin: Skin is warm and dry.  Nursing note and vitals reviewed.    ED Treatments / Results  Labs (all labs ordered are listed, but only abnormal results are displayed) Labs Reviewed - No data to display  EKG None  Radiology No results found.  Procedures Procedures (including critical care time)  Medications Ordered in ED Medications - No data to display   Initial  Impression / Assessment and Plan / ED Course  I have reviewed the triage vital signs and the nursing notes.  Pertinent labs & imaging results that were available during my care of the patient were reviewed by me and considered in my medical  decision making (see chart for details).     Patient's hypertension could be multifactorial.  Likely related to her chronic disease in addition to not taking her blood pressure this morning.  She is also quite uncomfortable and rates her pain is an 8/10.  However has only mild headache that is probably more related to the ear.  No chest pain or other concerning signs of endorgan damage from the hypertension.  I discussed she needs to take her medicine as soon as she gets home and further monitor at home to follow-up with PCP.  As for her other symptoms she appears to have a viral URI.  She has clear lungs without fever, increased work of breathing, or hypoxia.  I do not think x-ray needed as I doubt pneumonia.  Her ear does not show any obvious pathology and likely this is related to the congestion she is been experience them.  We discussed treatments to try.  Return precautions.  Final Clinical Impressions(s) / ED Diagnoses   Final diagnoses:  Viral upper respiratory infection  Otalgia of left ear  Hypertensive urgency    ED Discharge Orders    None       Sherwood Gambler, MD 02/27/18 [phone]

## 2018-03-01 ENCOUNTER — Ambulatory Visit: Payer: BLUE CROSS/BLUE SHIELD | Admitting: Family

## 2018-03-08 ENCOUNTER — Other Ambulatory Visit: Payer: Self-pay

## 2018-03-09 ENCOUNTER — Ambulatory Visit (INDEPENDENT_AMBULATORY_CARE_PROVIDER_SITE_OTHER): Payer: BLUE CROSS/BLUE SHIELD | Admitting: Internal Medicine

## 2018-03-09 ENCOUNTER — Encounter: Payer: Self-pay | Admitting: Internal Medicine

## 2018-03-09 ENCOUNTER — Ambulatory Visit (HOSPITAL_BASED_OUTPATIENT_CLINIC_OR_DEPARTMENT_OTHER)
Admission: RE | Admit: 2018-03-09 | Discharge: 2018-03-09 | Disposition: A | Discharge status: Home / Self Care | Payer: BLUE CROSS/BLUE SHIELD | Source: Ambulatory Visit | Attending: Internal Medicine | Admitting: Internal Medicine

## 2018-03-09 VITALS — BP 144/88 | HR 87 | Temp 98.3°F | Resp 16 | Ht 62.0 in | Wt 161.5 lb

## 2018-03-09 DIAGNOSIS — M545 Low back pain, unspecified: Secondary | ICD-10-CM

## 2018-03-09 DIAGNOSIS — R05 Cough: Secondary | ICD-10-CM | POA: Insufficient documentation

## 2018-03-09 DIAGNOSIS — R059 Cough, unspecified: Secondary | ICD-10-CM

## 2018-03-09 MED ORDER — HYDROCODONE-ACETAMINOPHEN 10-325 MG PO TABS: 1.0000 | ORAL_TABLET | Freq: Four times a day (QID) | ORAL | 0 refills | Status: DC | PRN

## 2018-03-09 MED ORDER — AZITHROMYCIN 250 MG PO TABS: ORAL_TABLET | ORAL | 0 refills | Status: DC

## 2018-03-09 MED ORDER — BENZONATATE 200 MG PO CAPS: 200.0000 mg | ORAL_CAPSULE | Freq: Three times a day (TID) | ORAL | 0 refills | Status: DC | PRN

## 2018-03-09 NOTE — Patient Instructions (Addendum)
Please get the x-rays downstairs  Mucinex DM twice a day until better  Flonase: 2 sprays on each side of the nose every day until better  Zithromax, an antibiotic, for 5 days  Tessalon Perles 3 times a day as needed for cough  Call if not gradually better

## 2018-03-09 NOTE — Progress Notes (Signed)
Pre visit review using our clinic review tool, if applicable. No additional management support is needed unless otherwise documented below in the visit note. 

## 2018-03-09 NOTE — Progress Notes (Signed)
Subjective:    [name], female    DOB: [birth year], 52 y.o.   MRN: [id number]  DOS:  03/09/2018 Type of visit - description : Acute visit Interval history: Symptoms of started 3 weeks ago with sore throat which is increasingly worse, left-sided ear pain, sinus congestion. Went to the ER 02/27/2018, DX without URI, recommended OTCs. She is here because she is not better.   Review of Systems Denies fever chills No chest pain, difficulty breathing or chest congestion. No nausea, vomiting, diarrhea.  Past Medical History:  Diagnosis Date  . Anxiety   . COMMON MIGRAINE 10/07/2010  . Decreased visual acuity 11/10/2016  . Depression 12/18/2012  . Diabetes mellitus type II, uncontrolled ([place]) 10/07/2010   Qualifier: Diagnosis of  By: Charlett Blake MD, [name]    . Diabetic foot infection ([place]) 08/26/2016  . Disturbance of skin sensation 10/07/2010  . Heart murmur   . History of kidney stones    "years ago"  . Hyperlipidemia 12/06/2010  . Hypertension   . Lipoma of abdominal wall 10/05/2016  . Overweight(278.02) 12/06/2010  . PERIPHERAL NEUROPATHY, FEET 10/07/2010  . PVD (peripheral vascular disease) ([place]) 01/21/2012  . RESTLESS LEG SYNDROME 10/25/2010  . Stroke [facility]) 2014, 2017   most recently in 2/17 - intracerebral hemorrhage    Past Surgical History:  Procedure Laterality Date  . AMPUTATION TOE Left 09/09/2016   Procedure: AMPUTATION OF LEFT GREAT TOE;  Surgeon: Milly Jakob, MD;  Location: [facility];  Service: Orthopedics;  Laterality: Left;  . WISDOM TOOTH EXTRACTION      Social History   Socioeconomic History  . Marital status: Single    Spouse name: Not on file  . Number of children: Not on file  . Years of education: Not on file  . Highest education level: Not on file  Occupational History  . Occupation: Glass blower/designer  Social Needs  . Financial resource strain: Not on file  . Food insecurity:    Worry: Not on file    Inability: Not on  file  . Transportation needs:    Medical: Not on file    Non-medical: Not on file  Tobacco Use  . Smoking status: Former Smoker    Packs/day: 0.50    Last attempt to quit: 09/08/2005    Years since quitting: 12.5  . Smokeless tobacco: Never Used  Substance and Sexual Activity  . Alcohol use: No    Alcohol/week: 0.0 oz  . Drug use: No  . Sexual activity: Yes    Partners: Male    Birth control/protection: None  Lifestyle  . Physical activity:    Days per week: Not on file    Minutes per session: Not on file  . Stress: Not on file  Relationships  . Social connections:    Talks on phone: Not on file    Gets together: Not on file    Attends religious service: Not on file    Active member of club or organization: Not on file    Attends meetings of clubs or organizations: Not on file    Relationship status: Not on file  . Intimate partner violence:    Fear of current or ex partner: Not on file    Emotionally abused: Not on file    Physically abused: Not on file    Forced sexual activity: Not on file  Other Topics Concern  . Not on file  Social History Narrative   Lives with husband in  a two story home.  Has one child.     Works as a Glass blower/designer.     Education: high school.      Allergies as of 03/09/2018      Reactions   Lyrica [pregabalin] Other (See Comments)   MADE PATIENT VERY EMOTIONAL AND WOULD CRY EASILY   Morphine And Related Nausea And Vomiting   Tramadol Nausea And Vomiting   Pt cant tolerate this pain med.       Medication List        Accurate as of 03/09/18 11:59 PM. Always use your most recent med list.          aspirin EC 81 MG tablet Take 81 mg by mouth daily.   azelastine 0.1 % nasal spray Commonly known as:  ASTELIN Place 2 sprays into both nostrils 2 (two) times daily. Use in each nostril as directed   azithromycin 250 MG tablet Commonly known as:  ZITHROMAX Take 2 tablets by mouth first day, then 1 tablet for 4 additional days     benzonatate 200 MG capsule Commonly known as:  TESSALON Take 1 capsule (200 mg total) by mouth 3 (three) times daily as needed for cough.   escitalopram 10 MG tablet Commonly known as:  LEXAPRO Take 1 tablet (10 mg total) by mouth daily.   fluticasone 50 MCG/ACT nasal spray Commonly known as:  FLONASE Place 2 sprays into both nostrils daily.   furosemide 20 MG tablet Commonly known as:  LASIX TAKE 2 TABS DAILY X5DAYS, TAKE 1 TAB DAILY X5DAYS. THEN TAKE 1 TAB DAILY AS NEEDED FOR EDEMA   gabapentin 100 MG capsule Commonly known as:  NEURONTIN Take 3 capsules (300 mg total) by mouth 3 (three) times daily.   HYDROcodone-acetaminophen 10-325 MG tablet Commonly known as:  NORCO Take 1-2 tablets by mouth every 6 (six) hours as needed (pain).   LANTUS SOLOSTAR 100 UNIT/ML Solostar Pen Generic drug:  Insulin Glargine INJECT 50 UNITS IN AM & 50 UNITS IN PM. INCREASE BY 2 UNITS EVERY 3 DAYS AS LONG AS ABOVE 100   Insulin Glargine 100 UNIT/ML Solostar Pen Commonly known as:  LANTUS SOLOSTAR INJECT 34 UNITS IN AM & 32 UNITS IN PM. INCREASE BY 2 UNITS EVERY 3 DAYS AS LONG AS ABOVE 100   levothyroxine 25 MCG tablet Commonly known as:  SYNTHROID, LEVOTHROID Take 1 tablet (25 mcg total) by mouth daily before breakfast.   lisinopril 20 MG tablet Commonly known as:  PRINIVIL,ZESTRIL Take 1 tablet (20 mg total) by mouth 2 (two) times daily.   meloxicam 7.5 MG tablet Commonly known as:  MOBIC TAKE 1 TABLET BY MOUTH EVERY DAY   metFORMIN 1000 MG tablet Commonly known as:  GLUCOPHAGE TAKE 1 TABLET TWICE DAILY AND 1/2 TABLET AT NOON   ONE TOUCH ULTRA TEST test strip Generic drug:  glucose blood USE ONCE DAILY TO CHECK BLOOD SUGAR. DX E11.9   silver sulfADIAZINE 1 % cream Commonly known as:  SILVADENE Apply 1 application topically daily.   simvastatin 10 MG tablet Commonly known as:  ZOCOR Take 1 tablet (10 mg total) by mouth daily.   tizanidine 2 MG capsule Commonly known as:   ZANAFLEX Take 1 capsule (2 mg total) by mouth 2 (two) times daily as needed for muscle spasms.   Vitamin D (Ergocalciferol) 50000 units Caps capsule Commonly known as:  DRISDOL Take 1 capsule (50,000 Units total) by mouth every 7 (seven) days.  Objective:   Physical Exam BP (!) 144/88 (BP Location: Left Arm, Patient Position: Sitting, Cuff Size: Small)   Pulse 87   Temp 98.3 F (36.8 C) (Oral)   Resp 16   Ht 5\' 2"  (1.575 m)   Wt 161 lb 8 oz (73.3 kg)   LMP 02/12/2011 (Exact Date)   SpO2 96%   BMI 29.54 kg/m  General:   Well developed, NAD, see BMI.  HEENT:  Normocephalic . Face symmetric, atraumatic. Throat: Symmetric, no red. Nose: Congested, sinuses no TTP TMs: Normal bilaterally, external canal is not swollen, not redness or discharge. Lungs:  Slightly decreased breath sounds but otherwise no clear Normal respiratory effort, no intercostal retractions, no accessory muscle use. Heart: RRR,  no murmur.  No pretibial edema bilaterally  Skin: Not pale. Not jaundice Neurologic:  alert & oriented X3.  Speech normal, gait appropriate for age and unassisted Psych--  Cognition and judgment appear intact.  Cooperative with normal attention span and concentration.  Behavior appropriate. No anxious or depressed appearing.      Assessment & Plan:   52 year old female, PMH includes HTN, DM, high cholesterol presents with Cough: Persistent for 3 weeks, associated with ear pain and sore throat.  Exam is actually benign with no obvious or  major throat or ear infection. Likely she has bronchitis, for completeness we will get a chest x-ray and treat with Zithromax, Mucinex, Flonase, Tessalon Perles. Call if not improving.  See AVS Back pain: Request prescription for hydrocodone which she thinks it sporadically, prescription sent.

## 2018-03-15 ENCOUNTER — Other Ambulatory Visit: Payer: Self-pay | Admitting: Family Medicine

## 2018-03-15 ENCOUNTER — Telehealth: Payer: Self-pay | Admitting: Family Medicine

## 2018-03-15 NOTE — Telephone Encounter (Incomplete)
Copied from [place] [id number]. Topic: Quick Communication - Rx Refill/Question >> Mar 15, 2018  1:48 PM Keene Breath wrote: MedicHYDROcodone-acetaminophen [facility]) 10-325 MG tabletation: ***  Patient called to request a refill for the above medication.  CB# [phone]  Preferred Pharmacy (with phone number or street name): [id number] [place], [place] - [name] [phone] (Phone) [phone] (Fax)

## 2018-03-15 NOTE — Telephone Encounter (Signed)
Copied from [place] [id number]. Topic: General - Other >> Mar 15, 2018  1:51 PM Keene Breath wrote: Reason for CRM: Patient is requesting a refill for the HYDROcodone-acetaminophen [facility]) 10-325 MG tablet, CB# [phone]  Preferred pharmacy  [id number] - [place], [place] - [name] [phone] (Phone) [phone] (Fax)

## 2018-03-16 MED ORDER — HYDROCODONE-ACETAMINOPHEN 10-325 MG PO TABS: 1.0000 | ORAL_TABLET | Freq: Four times a day (QID) | ORAL | 0 refills | Status: DC | PRN

## 2018-03-16 NOTE — Telephone Encounter (Signed)
Last hydrocodone RX: 03/09/18, #20 Last OV: 11/17/17 w/PCP, 03/09/18 (acute w/[facility]) Next OV: none scheduled UDS: 09/17/17, moderate CSC: 03/02/17 CSR: No discrepancies identified

## 2018-03-16 NOTE — Telephone Encounter (Signed)
Hydrocodoe refill Last Refill:03/09/18 #20 Last OV: 03/09/18 PCP: Dr. Gracelyn Nurse [facility]

## 2018-03-16 NOTE — Telephone Encounter (Signed)
OK to refill on her at her regular number and have her come in to see me in next 1-2 months to update everything

## 2018-03-16 NOTE — Telephone Encounter (Signed)
Duplicate encounter

## 2018-03-17 NOTE — Telephone Encounter (Signed)
Notified pt and scheduled follow up for 04/23/18 at 10:30am.

## 2018-03-29 ENCOUNTER — Other Ambulatory Visit: Payer: Self-pay | Admitting: Family Medicine

## 2018-04-19 ENCOUNTER — Other Ambulatory Visit: Payer: Self-pay | Admitting: Family Medicine

## 2018-04-19 ENCOUNTER — Ambulatory Visit: Payer: Self-pay | Admitting: Family Medicine

## 2018-04-19 MED ORDER — LANTUS SOLOSTAR 100 UNIT/ML ~~LOC~~ SOPN: PEN_INJECTOR | SUBCUTANEOUS | 4 refills | Status: DC

## 2018-04-19 NOTE — Telephone Encounter (Signed)
She called in with a glucose of 553 that she got just now.   She has been out of insulin (type 2) for 2 weeks.   She has been using extra insulin due to her glucoses being high. She is at Hospice with her husband and can't leave him.   "I just haven't had time to call for an appt with all that is going on with my husband".    I'm not due for a refill for a couple of more weeks.    She is requesting advice from Dr. Charlett Blake on what she should do especially with her circumstances of not leaving her husband there at Hospice.  I let her know someone from the office will be in contact with her.  I routed a high priority note and called the flow coordinator and spoke with Raquel Sarna making her aware of the pt's circumstances.   Reason for Disposition . Blood glucose > 400 mg/dL (22.2 mmol/L)  Answer Assessment - Initial Assessment Questions 1. BLOOD GLUCOSE: "What is your blood glucose level?"      553 this morning. 2. ONSET: "When did you check the blood glucose?"     Checked it while on the phone with agent. 3. USUAL RANGE: "What is your glucose level usually?" (e.g., usual fasting morning value, usual evening value)     189 is my usual.     I've not had insulin for over 2 weeks.   I can't get anymore.   I've been using more so I ran out early.    4. KETONES: "Do you check for ketones (urine or blood test strips)?" If yes, ask: "What does the test show now?"      N/A 5. TYPE 1 or 2:  "Do you know what type of diabetes you have?"  (e.g., Type 1, Type 2, Gestational; doesn't know)      I'm at hospice with my husband.   I've not had time to call the doctor.     Type II 6. INSULIN: "Do you take insulin?" "What type of insulin(s) do you use? What is the mode of delivery? (syringe, pen (e.g., injection or  pump)?"      Yes   I've been out for 2 weeks. 7. DIABETES PILLS: "Do you take any pills for your diabetes?" If yes, ask: "Have you missed taking any pills recently?"     I'm not taking metformin right  now.   It makes me sick.    8. OTHER SYMPTOMS: "Do you have any symptoms?" (e.g., fever, frequent urination, difficulty breathing, dizziness, weakness, vomiting)     Nausea, blurred vision a little.   Urinating more frequently.    9. PREGNANCY: "Is there any chance you are pregnant?" "When was your last menstrual period?"     No.  Protocols used: DIABETES - HIGH BLOOD SUGAR-A-AH

## 2018-04-19 NOTE — Addendum Note (Signed)
Addended by: Raynelle Dick R on: 04/19/2018 10:14 AM   Modules accepted: Orders

## 2018-04-19 NOTE — Telephone Encounter (Signed)
Author phoned pt. again per Dr. Frederik Pear request, and pt. stated that she has been currently taking lantus 100 units in AM and 50 units in PM. Pt. states her BG readings were 140-160 on those dosages. Pt. states she is currently drinking more water, and her blurred vision has improved since earlier this AM. Pt. endorses no other symptoms at this time. Pt. [facility] [place]; order left pended for Dr. Frederik Pear review, and pt. stated she plans to come to 8/16 appointment with Dr. Charlett Blake to review her DM, and ask about referrals for her husband who is on hospice care.

## 2018-04-19 NOTE — Telephone Encounter (Signed)
Author phoned pt. to clarify what she is taking for her DM, what her supplies look like now, and what her symptoms are. From med list, pt. does not appear to be taking short-acting insulin, but only lantus and metformin. No answer on VM. Author left detailed VM, stating that a BG of 500+ indicates need for ED visit, asking pt. To call back to clarify at [phone], or [phone]. Will route to Dr. Charlett Blake and Jorene Minors, Dr. Frederik Pear CMA.

## 2018-04-23 ENCOUNTER — Ambulatory Visit: Payer: BLUE CROSS/BLUE SHIELD | Admitting: Family Medicine

## 2018-05-06 ENCOUNTER — Ambulatory Visit: Payer: BLUE CROSS/BLUE SHIELD | Admitting: Family Medicine

## 2018-06-07 ENCOUNTER — Ambulatory Visit (INDEPENDENT_AMBULATORY_CARE_PROVIDER_SITE_OTHER): Payer: BLUE CROSS/BLUE SHIELD | Admitting: Physician Assistant

## 2018-06-07 ENCOUNTER — Other Ambulatory Visit: Payer: Self-pay

## 2018-06-07 ENCOUNTER — Encounter: Payer: Self-pay | Admitting: Physician Assistant

## 2018-06-07 ENCOUNTER — Ambulatory Visit: Payer: Self-pay | Admitting: *Deleted

## 2018-06-07 VITALS — BP 138/82 | HR 88 | Temp 98.5°F | Resp 14 | Ht 62.0 in | Wt 167.0 lb

## 2018-06-07 DIAGNOSIS — S91109A Unspecified open wound of unspecified toe(s) without damage to nail, initial encounter: Secondary | ICD-10-CM | POA: Diagnosis not present

## 2018-06-07 MED ORDER — DOXYCYCLINE HYCLATE 100 MG PO CAPS: 100.0000 mg | ORAL_CAPSULE | Freq: Two times a day (BID) | ORAL | 0 refills | Status: DC

## 2018-06-07 NOTE — Progress Notes (Signed)
Patient presents to clinic today c/o injury to 2nd toe of R foot. First noted yesterday. Denies any known trauma but has no feeling in her feet due to progressed peripheral neuropathy. Notes the nail came off with this. Denies any pain. Denies fever, chills or drainage. .   Past Medical History:  Diagnosis Date  . Anxiety   . COMMON MIGRAINE 10/07/2010  . Decreased visual acuity 11/10/2016  . Depression 12/18/2012  . Diabetes mellitus type II, uncontrolled ([place]) 10/07/2010   Qualifier: Diagnosis of  By: Charlett Blake MD, [name]    . Diabetic foot infection ([place]) 08/26/2016  . Disturbance of skin sensation 10/07/2010  . Heart murmur   . History of kidney stones    "years ago"  . Hyperlipidemia 12/06/2010  . Hypertension   . Lipoma of abdominal wall 10/05/2016  . Overweight(278.02) 12/06/2010  . PERIPHERAL NEUROPATHY, FEET 10/07/2010  . PVD (peripheral vascular disease) ([place]) 01/21/2012  . RESTLESS LEG SYNDROME 10/25/2010  . Stroke [facility]) 2014, 2017   most recently in 2/17 - intracerebral hemorrhage    Current Outpatient Medications on File Prior to Visit  Medication Sig Dispense Refill  . aspirin EC 81 MG tablet Take 81 mg by mouth daily.    Marland Kitchen azelastine (ASTELIN) 0.1 % nasal spray Place 2 sprays into both nostrils 2 (two) times daily. Use in each nostril as directed 30 mL 3  . escitalopram (LEXAPRO) 10 MG tablet Take 1 tablet (10 mg total) by mouth daily. 30 tablet 2  . fluticasone (FLONASE) 50 MCG/ACT nasal spray Place 2 sprays into both nostrils daily. 16 g 1  . furosemide (LASIX) 20 MG tablet TAKE 2 TABS DAILY X5DAYS, TAKE 1 TAB DAILY X5DAYS. THEN TAKE 1 TAB DAILY AS NEEDED FOR EDEMA 35 tablet 1  . gabapentin (NEURONTIN) 100 MG capsule Take 3 capsules (300 mg total) by mouth 3 (three) times daily. 90 capsule 3  . HYDROcodone-acetaminophen (NORCO) 10-325 MG tablet Take 1-2 tablets by mouth every 6 (six) hours as needed (pain). 20 tablet 0  . LANTUS SOLOSTAR 100 UNIT/ML Solostar Pen 100 units sq  in am and 50 units sq in pm 45 mL 4  . levothyroxine (SYNTHROID, LEVOTHROID) 25 MCG tablet Take 1 tablet (25 mcg total) by mouth daily before breakfast. 30 tablet 1  . lisinopril (PRINIVIL,ZESTRIL) 20 MG tablet Take 1 tablet (20 mg total) by mouth 2 (two) times daily. 90 tablet 2  . meloxicam (MOBIC) 7.5 MG tablet TAKE 1 TABLET BY MOUTH EVERY DAY 20 tablet 0  . metFORMIN (GLUCOPHAGE) 1000 MG tablet TAKE 1 TABLET TWICE DAILY AND 1/2 TABLET AT NOON (Patient taking differently: Take 1000mg  every morning; Take 500 mg at lunch; Take 1000 mg at bedtime) 75 tablet 1  . ONE TOUCH ULTRA TEST test strip USE ONCE DAILY TO CHECK BLOOD SUGAR. DX E11.9  2  . ONE TOUCH ULTRA TEST test strip USE ONCE DAILY TO CHECK BLOOD SUGAR. DX E11.9 100 each 1  . silver sulfADIAZINE (SILVADENE) 1 % cream Apply 1 application topically daily. 50 g 0  . simvastatin (ZOCOR) 10 MG tablet Take 1 tablet (10 mg total) by mouth daily. 30 tablet 3  . tizanidine (ZANAFLEX) 2 MG capsule Take 1 capsule (2 mg total) by mouth 2 (two) times daily as needed for muscle spasms. 30 capsule 0   No current facility-administered medications on file prior to visit.     Allergies  Allergen Reactions  . Lyrica [Pregabalin] Other (See Comments)  MADE PATIENT VERY EMOTIONAL AND WOULD CRY EASILY  . Morphine And Related Nausea And Vomiting  . Tramadol Nausea And Vomiting    Pt cant tolerate this pain med.     Family History  Problem Relation Age of Onset  . Arthritis Mother   . Stroke Brother        previous smoker  . Alcohol abuse Brother        in remission  . Leukemia Brother   . Diabetes Paternal Grandmother   . Healthy Son     Social History   Socioeconomic History  . Marital status: Single    Spouse name: Not on file  . Number of children: Not on file  . Years of education: Not on file  . Highest education level: Not on file  Occupational History  . Occupation: Glass blower/designer  Social Needs  . Financial resource strain:  Not on file  . Food insecurity:    Worry: Not on file    Inability: Not on file  . Transportation needs:    Medical: Not on file    Non-medical: Not on file  Tobacco Use  . Smoking status: Former Smoker    Packs/day: 0.50    Last attempt to quit: 09/08/2005    Years since quitting: 12.7  . Smokeless tobacco: Never Used  Substance and Sexual Activity  . Alcohol use: No    Alcohol/week: 0.0 standard drinks  . Drug use: No  . Sexual activity: Yes    Partners: Male    Birth control/protection: None  Lifestyle  . Physical activity:    Days per week: Not on file    Minutes per session: Not on file  . Stress: Not on file  Relationships  . Social connections:    Talks on phone: Not on file    Gets together: Not on file    Attends religious service: Not on file    Active member of club or organization: Not on file    Attends meetings of clubs or organizations: Not on file    Relationship status: Not on file  Other Topics Concern  . Not on file  Social History Narrative   Lives with husband in a two story home.  Has one child.     Works as a Glass blower/designer.     Education: high school.   Review of Systems - See HPI.  All other ROS are negative.  BP 138/82   Pulse 88   Temp 98.5 F (36.9 C) (Oral)   Resp 14   Ht 5\' 2"  (1.575 m)   Wt 167 lb (75.8 kg)   LMP 02/12/2011 (Exact Date)   SpO2 98%   BMI 30.54 kg/m   Physical Exam  Constitutional: She is oriented to person, place, and time. She appears well-developed and well-nourished.  HENT:  Head: Normocephalic and atraumatic.  Eyes: Conjunctivae are normal.  Neck: Neck supple.  Cardiovascular: Normal rate, regular rhythm, normal heart sounds and intact distal pulses.  Pulmonary/Chest: Effort normal and breath sounds normal. No stridor. No respiratory distress. She has no wheezes. She has no rales. She exhibits no tenderness.  Neurological: She is alert and oriented to person, place, and time.  Vitals  reviewed.  Assessment/Plan: 1. Avulsion of skin of toe, initial encounter Area cleaned and dressed. Home care reviewed with patient. Giving history and risk for infection, will start Doxycycline. Follow-up on Thursday for reassessment. Strict return precautions or ER precautions reviewed with patient.  - doxycycline (VIBRAMYCIN)  100 MG capsule; Take 1 capsule (100 mg total) by mouth 2 (two) times daily.  Dispense: 14 capsule; Refill: 0    Leeanne Rio, PA-C

## 2018-06-07 NOTE — Patient Instructions (Signed)
Please keep skin clean and dry. Wash with clean water and soap.  Dry well. Apply very thin layer of neosporin to the area. Keep wrapped up in clean gauze or non-stick pads.  Take antibiotic as directed. Follow-up with me Thursday or Friday for reassessment. Return immediately for any drainage, increased redness of toe.

## 2018-06-07 NOTE — Telephone Encounter (Signed)
Pt called stating that she stomped her 2nd toe on her right foot; she has had intermittent bleeding, and her toe nail is came off; recommendations made per nurse triage protocol; the pt normally sees Dr Charlett Blake, [facility], but there is no availability in office; pr offered and accepted appointment with Raiford Noble, [facility], 9.30/19 at 1445; she verbalizes understanding; will route to office for notification of this upcoming appointment.   Reason for Disposition . [1] Has diabetes (diabetes mellitus) AND [2] minor cut or scrape  Answer Assessment - Initial Assessment Questions 1. MECHANISM: "How did the injury happen?" (e.g., twisting injury, direct blow)      Direct blow; stomped toe 2. ONSET: "When did the injury happen?" (Minutes or hours ago)      06/06/18 around 1200 3. LOCATION: "Where is the injury located?"      Right foot 2nd toe from the joint to the tip of toe 4. APPEARANCE of INJURY: "What does the injury look like?"      Toe is red; looks like blood under the skin 5. WEIGHT-BEARING: "Can you put weight on that foot?" "Can you walk (four steps or more)?"       yes 6. SIZE: For cuts, bruises, or swelling, ask: "How large is it?" (e.g., inches or centimeters;  entire joint)      Entire tip of toe 7. PAIN: "Is there pain?" If so, ask: "How bad is the pain?"    (e.g., Scale 1-10; or mild, moderate, severe)     Pt has neuropathy and can not feet the pain (pt is diabetic) 8. TETANUS: For any breaks in the skin, ask: "When was the last tetanus booster?"     Toe nail fell off 9. OTHER SYMPTOMS: "Do you have any other symptoms?"      Intermittent bleeding 10. PREGNANCY: "Is there any chance you are pregnant?" "When was your last menstrual period?"   No  Protocols used: FOOT AND ANKLE INJURY-A-AH

## 2018-06-10 ENCOUNTER — Other Ambulatory Visit: Payer: Self-pay

## 2018-06-10 ENCOUNTER — Encounter: Payer: Self-pay | Admitting: Physician Assistant

## 2018-06-10 ENCOUNTER — Ambulatory Visit (INDEPENDENT_AMBULATORY_CARE_PROVIDER_SITE_OTHER): Payer: BLUE CROSS/BLUE SHIELD | Admitting: Physician Assistant

## 2018-06-10 ENCOUNTER — Ambulatory Visit: Payer: BLUE CROSS/BLUE SHIELD | Admitting: Physician Assistant

## 2018-06-10 VITALS — BP 140/80 | HR 95 | Temp 98.5°F | Resp 14 | Ht 62.0 in | Wt 170.0 lb

## 2018-06-10 DIAGNOSIS — S91109D Unspecified open wound of unspecified toe(s) without damage to nail, subsequent encounter: Secondary | ICD-10-CM

## 2018-06-10 DIAGNOSIS — Z0289 Encounter for other administrative examinations: Secondary | ICD-10-CM

## 2018-06-10 NOTE — Progress Notes (Signed)
Patient presents to clinic today for follow-up to reassess toe after skin avulsion. At last visit, wound care was discussed and patient was started on Doxycycline due to risk of infection from DM II. Patient endorses keeping skin clean and dry. Has been applying a good amount of Neosporin to the area daily. Is taking antibiotic as directed. Denies any increased redness. Feels the area is looking better.   Past Medical History:  Diagnosis Date  . Anxiety   . COMMON MIGRAINE 10/07/2010  . Decreased visual acuity 11/10/2016  . Depression 12/18/2012  . Diabetes mellitus type II, uncontrolled ([place]) 10/07/2010   Qualifier: Diagnosis of  By: Charlett Blake MD, [name]    . Diabetic foot infection ([place]) 08/26/2016  . Disturbance of skin sensation 10/07/2010  . Heart murmur   . History of kidney stones    "years ago"  . Hyperlipidemia 12/06/2010  . Hypertension   . Lipoma of abdominal wall 10/05/2016  . Overweight(278.02) 12/06/2010  . PERIPHERAL NEUROPATHY, FEET 10/07/2010  . PVD (peripheral vascular disease) ([place]) 01/21/2012  . RESTLESS LEG SYNDROME 10/25/2010  . Stroke [facility]) 2014, 2017   most recently in 2/17 - intracerebral hemorrhage    Current Outpatient Medications on File Prior to Visit  Medication Sig Dispense Refill  . aspirin EC 81 MG tablet Take 81 mg by mouth daily.    Marland Kitchen azelastine (ASTELIN) 0.1 % nasal spray Place 2 sprays into both nostrils 2 (two) times daily. Use in each nostril as directed 30 mL 3  . doxycycline (VIBRAMYCIN) 100 MG capsule Take 1 capsule (100 mg total) by mouth 2 (two) times daily. 14 capsule 0  . escitalopram (LEXAPRO) 10 MG tablet Take 1 tablet (10 mg total) by mouth daily. 30 tablet 2  . fluticasone (FLONASE) 50 MCG/ACT nasal spray Place 2 sprays into both nostrils daily. 16 g 1  . furosemide (LASIX) 20 MG tablet TAKE 2 TABS DAILY X5DAYS, TAKE 1 TAB DAILY X5DAYS. THEN TAKE 1 TAB DAILY AS NEEDED FOR EDEMA 35 tablet 1  . gabapentin (NEURONTIN) 100 MG capsule Take 3 capsules  (300 mg total) by mouth 3 (three) times daily. 90 capsule 3  . HYDROcodone-acetaminophen (NORCO) 10-325 MG tablet Take 1-2 tablets by mouth every 6 (six) hours as needed (pain). 20 tablet 0  . LANTUS SOLOSTAR 100 UNIT/ML Solostar Pen 100 units sq in am and 50 units sq in pm 45 mL 4  . levothyroxine (SYNTHROID, LEVOTHROID) 25 MCG tablet Take 1 tablet (25 mcg total) by mouth daily before breakfast. 30 tablet 1  . lisinopril (PRINIVIL,ZESTRIL) 20 MG tablet Take 1 tablet (20 mg total) by mouth 2 (two) times daily. 90 tablet 2  . meloxicam (MOBIC) 7.5 MG tablet TAKE 1 TABLET BY MOUTH EVERY DAY 20 tablet 0  . metFORMIN (GLUCOPHAGE) 1000 MG tablet TAKE 1 TABLET TWICE DAILY AND 1/2 TABLET AT NOON (Patient taking differently: Take 1000mg  every morning; Take 500 mg at lunch; Take 1000 mg at bedtime) 75 tablet 1  . ONE TOUCH ULTRA TEST test strip USE ONCE DAILY TO CHECK BLOOD SUGAR. DX E11.9  2  . ONE TOUCH ULTRA TEST test strip USE ONCE DAILY TO CHECK BLOOD SUGAR. DX E11.9 100 each 1  . silver sulfADIAZINE (SILVADENE) 1 % cream Apply 1 application topically daily. 50 g 0  . simvastatin (ZOCOR) 10 MG tablet Take 1 tablet (10 mg total) by mouth daily. 30 tablet 3  . tizanidine (ZANAFLEX) 2 MG capsule Take 1 capsule (2 mg total) by  mouth 2 (two) times daily as needed for muscle spasms. 30 capsule 0   No current facility-administered medications on file prior to visit.     Allergies  Allergen Reactions  . Lyrica [Pregabalin] Other (See Comments)    MADE PATIENT VERY EMOTIONAL AND WOULD CRY EASILY  . Morphine And Related Nausea And Vomiting  . Tramadol Nausea And Vomiting    Pt cant tolerate this pain med.     Family History  Problem Relation Age of Onset  . Arthritis Mother   . Stroke Brother        previous smoker  . Alcohol abuse Brother        in remission  . Leukemia Brother   . Diabetes Paternal Grandmother   . Healthy Son     Social History   Socioeconomic History  . Marital status:  Single    Spouse name: Not on file  . Number of children: Not on file  . Years of education: Not on file  . Highest education level: Not on file  Occupational History  . Occupation: Glass blower/designer  Social Needs  . Financial resource strain: Not on file  . Food insecurity:    Worry: Not on file    Inability: Not on file  . Transportation needs:    Medical: Not on file    Non-medical: Not on file  Tobacco Use  . Smoking status: Former Smoker    Packs/day: 0.50    Last attempt to quit: 09/08/2005    Years since quitting: 12.7  . Smokeless tobacco: Never Used  Substance and Sexual Activity  . Alcohol use: No    Alcohol/week: 0.0 standard drinks  . Drug use: No  . Sexual activity: Yes    Partners: Male    Birth control/protection: None  Lifestyle  . Physical activity:    Days per week: Not on file    Minutes per session: Not on file  . Stress: Not on file  Relationships  . Social connections:    Talks on phone: Not on file    Gets together: Not on file    Attends religious service: Not on file    Active member of club or organization: Not on file    Attends meetings of clubs or organizations: Not on file    Relationship status: Not on file  Other Topics Concern  . Not on file  Social History Narrative   Lives with husband in a two story home.  Has one child.     Works as a Glass blower/designer.     Education: high school.   Review of Systems - See HPI.  All other ROS are negative.  BP 140/80   Pulse 95   Temp 98.5 F (36.9 C) (Oral)   Resp 14   Ht 5\' 2"  (1.575 m)   Wt 170 lb (77.1 kg)   LMP 02/12/2011 (Exact Date)   SpO2 98%   BMI 31.09 kg/m   Physical Exam  Constitutional: She appears well-developed and well-nourished.  HENT:  Head: Normocephalic and atraumatic.  Cardiovascular: Normal rate, regular rhythm, normal heart sounds and intact distal pulses.  Pulmonary/Chest: Effort normal and breath sounds normal. No stridor. No respiratory distress. She has no  wheezes. She has no rales. She exhibits no tenderness.  Skin:     Psychiatric: She has a normal mood and affect.  Vitals reviewed.  Assessment/Plan: 1. Avulsion of skin of toe, subsequent encounter Improving. No sign of active infection. Continue wound care  and antibiotic. Follow-up 1 week. Strict ER precautions reviewed.    Leeanne Rio, PA-C

## 2018-06-10 NOTE — Patient Instructions (Signed)
Please keep the area clean and dry. You have been putting way too much Neosporin on it! Complete the entire course of antibiotic.  Follow-up in 7-10 days for repeat assessment with me or PCP. ER for any worsening symptoms.

## 2018-06-23 ENCOUNTER — Inpatient Hospital Stay (HOSPITAL_COMMUNITY)
Admission: EM | Admit: 2018-06-23 | Discharge: 2018-07-06 | DRG: 233 | Disposition: A | Discharge status: Home Health | Payer: BLUE CROSS/BLUE SHIELD | Attending: Thoracic Surgery (Cardiothoracic Vascular Surgery) | Admitting: Thoracic Surgery (Cardiothoracic Vascular Surgery)

## 2018-06-23 ENCOUNTER — Encounter (HOSPITAL_COMMUNITY): Payer: Self-pay | Admitting: Physician Assistant

## 2018-06-23 ENCOUNTER — Emergency Department (HOSPITAL_COMMUNITY): Payer: BLUE CROSS/BLUE SHIELD

## 2018-06-23 DIAGNOSIS — Z806 Family history of leukemia: Secondary | ICD-10-CM | POA: Diagnosis not present

## 2018-06-23 DIAGNOSIS — Z885 Allergy status to narcotic agent status: Secondary | ICD-10-CM

## 2018-06-23 DIAGNOSIS — E785 Hyperlipidemia, unspecified: Secondary | ICD-10-CM | POA: Diagnosis present

## 2018-06-23 DIAGNOSIS — Z791 Long term (current) use of non-steroidal anti-inflammatories (NSAID): Secondary | ICD-10-CM | POA: Diagnosis not present

## 2018-06-23 DIAGNOSIS — Z7982 Long term (current) use of aspirin: Secondary | ICD-10-CM

## 2018-06-23 DIAGNOSIS — Z823 Family history of stroke: Secondary | ICD-10-CM

## 2018-06-23 DIAGNOSIS — J9811 Atelectasis: Secondary | ICD-10-CM | POA: Diagnosis not present

## 2018-06-23 DIAGNOSIS — I400 Infective myocarditis: Secondary | ICD-10-CM | POA: Diagnosis not present

## 2018-06-23 DIAGNOSIS — Z8261 Family history of arthritis: Secondary | ICD-10-CM

## 2018-06-23 DIAGNOSIS — I371 Nonrheumatic pulmonary valve insufficiency: Secondary | ICD-10-CM | POA: Diagnosis not present

## 2018-06-23 DIAGNOSIS — Z01810 Encounter for preprocedural cardiovascular examination: Secondary | ICD-10-CM | POA: Diagnosis not present

## 2018-06-23 DIAGNOSIS — Z951 Presence of aortocoronary bypass graft: Secondary | ICD-10-CM

## 2018-06-23 DIAGNOSIS — Z811 Family history of alcohol abuse and dependence: Secondary | ICD-10-CM

## 2018-06-23 DIAGNOSIS — E1159 Type 2 diabetes mellitus with other circulatory complications: Secondary | ICD-10-CM | POA: Diagnosis present

## 2018-06-23 DIAGNOSIS — D638 Anemia in other chronic diseases classified elsewhere: Secondary | ICD-10-CM | POA: Diagnosis present

## 2018-06-23 DIAGNOSIS — E11319 Type 2 diabetes mellitus with unspecified diabetic retinopathy without macular edema: Secondary | ICD-10-CM | POA: Diagnosis not present

## 2018-06-23 DIAGNOSIS — R05 Cough: Secondary | ICD-10-CM | POA: Diagnosis not present

## 2018-06-23 DIAGNOSIS — Z09 Encounter for follow-up examination after completed treatment for conditions other than malignant neoplasm: Secondary | ICD-10-CM

## 2018-06-23 DIAGNOSIS — I110 Hypertensive heart disease with heart failure: Secondary | ICD-10-CM | POA: Diagnosis present

## 2018-06-23 DIAGNOSIS — Z833 Family history of diabetes mellitus: Secondary | ICD-10-CM | POA: Diagnosis not present

## 2018-06-23 DIAGNOSIS — G2581 Restless legs syndrome: Secondary | ICD-10-CM | POA: Diagnosis not present

## 2018-06-23 DIAGNOSIS — I214 Non-ST elevation (NSTEMI) myocardial infarction: Principal | ICD-10-CM

## 2018-06-23 DIAGNOSIS — E11621 Type 2 diabetes mellitus with foot ulcer: Secondary | ICD-10-CM | POA: Diagnosis present

## 2018-06-23 DIAGNOSIS — H409 Unspecified glaucoma: Secondary | ICD-10-CM | POA: Diagnosis present

## 2018-06-23 DIAGNOSIS — I10 Essential (primary) hypertension: Secondary | ICD-10-CM | POA: Diagnosis not present

## 2018-06-23 DIAGNOSIS — F329 Major depressive disorder, single episode, unspecified: Secondary | ICD-10-CM | POA: Diagnosis present

## 2018-06-23 DIAGNOSIS — E1165 Type 2 diabetes mellitus with hyperglycemia: Secondary | ICD-10-CM | POA: Diagnosis present

## 2018-06-23 DIAGNOSIS — R011 Cardiac murmur, unspecified: Secondary | ICD-10-CM | POA: Diagnosis not present

## 2018-06-23 DIAGNOSIS — E1151 Type 2 diabetes mellitus with diabetic peripheral angiopathy without gangrene: Secondary | ICD-10-CM | POA: Diagnosis present

## 2018-06-23 DIAGNOSIS — Z7984 Long term (current) use of oral hypoglycemic drugs: Secondary | ICD-10-CM | POA: Diagnosis not present

## 2018-06-23 DIAGNOSIS — E876 Hypokalemia: Secondary | ICD-10-CM | POA: Diagnosis not present

## 2018-06-23 DIAGNOSIS — Z79890 Hormone replacement therapy (postmenopausal): Secondary | ICD-10-CM

## 2018-06-23 DIAGNOSIS — R0989 Other specified symptoms and signs involving the circulatory and respiratory systems: Secondary | ICD-10-CM | POA: Diagnosis not present

## 2018-06-23 DIAGNOSIS — I5033 Acute on chronic diastolic (congestive) heart failure: Secondary | ICD-10-CM | POA: Diagnosis not present

## 2018-06-23 DIAGNOSIS — I2510 Atherosclerotic heart disease of native coronary artery without angina pectoris: Secondary | ICD-10-CM

## 2018-06-23 DIAGNOSIS — E119 Type 2 diabetes mellitus without complications: Secondary | ICD-10-CM | POA: Diagnosis not present

## 2018-06-23 DIAGNOSIS — I5043 Acute on chronic combined systolic (congestive) and diastolic (congestive) heart failure: Secondary | ICD-10-CM | POA: Diagnosis not present

## 2018-06-23 DIAGNOSIS — I25119 Atherosclerotic heart disease of native coronary artery with unspecified angina pectoris: Secondary | ICD-10-CM | POA: Diagnosis not present

## 2018-06-23 DIAGNOSIS — Z87442 Personal history of urinary calculi: Secondary | ICD-10-CM

## 2018-06-23 DIAGNOSIS — R0602 Shortness of breath: Secondary | ICD-10-CM | POA: Diagnosis not present

## 2018-06-23 DIAGNOSIS — I081 Rheumatic disorders of both mitral and tricuspid valves: Secondary | ICD-10-CM | POA: Diagnosis not present

## 2018-06-23 DIAGNOSIS — J90 Pleural effusion, not elsewhere classified: Secondary | ICD-10-CM | POA: Diagnosis not present

## 2018-06-23 DIAGNOSIS — M79609 Pain in unspecified limb: Secondary | ICD-10-CM | POA: Diagnosis not present

## 2018-06-23 DIAGNOSIS — I739 Peripheral vascular disease, unspecified: Secondary | ICD-10-CM | POA: Diagnosis not present

## 2018-06-23 DIAGNOSIS — Z87891 Personal history of nicotine dependence: Secondary | ICD-10-CM

## 2018-06-23 DIAGNOSIS — D62 Acute posthemorrhagic anemia: Secondary | ICD-10-CM | POA: Diagnosis not present

## 2018-06-23 DIAGNOSIS — Z7951 Long term (current) use of inhaled steroids: Secondary | ICD-10-CM | POA: Diagnosis not present

## 2018-06-23 DIAGNOSIS — E039 Hypothyroidism, unspecified: Secondary | ICD-10-CM | POA: Diagnosis present

## 2018-06-23 DIAGNOSIS — Z888 Allergy status to other drugs, medicaments and biological substances status: Secondary | ICD-10-CM

## 2018-06-23 DIAGNOSIS — L97509 Non-pressure chronic ulcer of other part of unspecified foot with unspecified severity: Secondary | ICD-10-CM | POA: Diagnosis present

## 2018-06-23 DIAGNOSIS — R079 Chest pain, unspecified: Secondary | ICD-10-CM | POA: Diagnosis present

## 2018-06-23 DIAGNOSIS — Z8673 Personal history of transient ischemic attack (TIA), and cerebral infarction without residual deficits: Secondary | ICD-10-CM

## 2018-06-23 DIAGNOSIS — E1142 Type 2 diabetes mellitus with diabetic polyneuropathy: Secondary | ICD-10-CM | POA: Diagnosis not present

## 2018-06-23 DIAGNOSIS — I25110 Atherosclerotic heart disease of native coronary artery with unstable angina pectoris: Secondary | ICD-10-CM | POA: Diagnosis not present

## 2018-06-23 HISTORY — DX: Acute on chronic diastolic (congestive) heart failure: I50.33

## 2018-06-23 LAB — CBC WITH DIFFERENTIAL/PLATELET
Abs Immature Granulocytes: 0.04 10*3/uL (ref 0.00–0.07)
Basophils Absolute: 0.1 10*3/uL (ref 0.0–0.1)
Basophils Relative: 1 %
Eosinophils Absolute: 0.2 10*3/uL (ref 0.0–0.5)
Eosinophils Relative: 2 %
HCT: 32.4 % — ABNORMAL LOW (ref 36.0–46.0)
Hemoglobin: 9.7 g/dL — ABNORMAL LOW (ref 12.0–15.0)
Immature Granulocytes: 0 %
Lymphocytes Relative: 8 %
Lymphs Abs: 0.9 10*3/uL (ref 0.7–4.0)
MCH: 26 pg (ref 26.0–34.0)
MCHC: 29.9 g/dL — ABNORMAL LOW (ref 30.0–36.0)
MCV: 86.9 fL (ref 80.0–100.0)
Monocytes Absolute: 0.8 10*3/uL (ref 0.1–1.0)
Monocytes Relative: 7 %
Neutro Abs: 9 10*3/uL — ABNORMAL HIGH (ref 1.7–7.7)
Neutrophils Relative %: 82 %
Platelets: 570 10*3/uL — ABNORMAL HIGH (ref 150–400)
RBC: 3.73 MIL/uL — ABNORMAL LOW (ref 3.87–5.11)
RDW: 13.3 % (ref 11.5–15.5)
WBC: 11.1 10*3/uL — ABNORMAL HIGH (ref 4.0–10.5)
nRBC: 0 % (ref 0.0–0.2)

## 2018-06-23 LAB — COMPREHENSIVE METABOLIC PANEL
ALT: 21 U/L (ref 0–44)
AST: 17 U/L (ref 15–41)
Albumin: 2.6 g/dL — ABNORMAL LOW (ref 3.5–5.0)
Alkaline Phosphatase: 98 U/L (ref 38–126)
Anion gap: 13 (ref 5–15)
BUN: 18 mg/dL (ref 6–20)
CO2: 24 mmol/L (ref 22–32)
Calcium: 8.8 mg/dL — ABNORMAL LOW (ref 8.9–10.3)
Chloride: 103 mmol/L (ref 98–111)
Creatinine, Ser: 1.01 mg/dL — ABNORMAL HIGH (ref 0.44–1.00)
GFR calc Af Amer: >60 mL/min (ref >60)
GFR calc non Af Amer: >60 mL/min (ref >60)
Glucose, Bld: 137 mg/dL — ABNORMAL HIGH (ref 70–99)
Potassium: 3.3 mmol/L — ABNORMAL LOW (ref 3.5–5.1)
Sodium: 140 mmol/L (ref 135–145)
Total Bilirubin: 0.5 mg/dL (ref 0.3–1.2)
Total Protein: 7.1 g/dL (ref 6.5–8.1)

## 2018-06-23 LAB — I-STAT TROPONIN, ED: Troponin i, poc: 0.4 ng/mL (ref 0.00–0.08)

## 2018-06-23 LAB — URINALYSIS, ROUTINE W REFLEX MICROSCOPIC
Bilirubin Urine: NEGATIVE
Glucose, UA: 150 mg/dL — AB
Ketones, ur: NEGATIVE mg/dL
Leukocytes, UA: NEGATIVE
Nitrite: NEGATIVE
Protein, ur: >=300 mg/dL — AB
Specific Gravity, Urine: 1.019 (ref 1.005–1.030)
pH: 6 (ref 5.0–8.0)

## 2018-06-23 LAB — I-STAT CG4 LACTIC ACID, ED
Lactic Acid, Venous: 1.32 mmol/L (ref 0.5–1.9)
Lactic Acid, Venous: 1.04 mmol/L (ref 0.5–1.9)

## 2018-06-23 LAB — BRAIN NATRIURETIC PEPTIDE: B Natriuretic Peptide: 335.2 pg/mL — ABNORMAL HIGH (ref 0.0–100.0)

## 2018-06-23 MED ORDER — ASPIRIN 81 MG PO CHEW
324.0000 mg | CHEWABLE_TABLET | Freq: Once | ORAL | Status: AC
  Administered 2018-06-23: 324 mg via ORAL
  Filled 2018-06-23: qty 4

## 2018-06-23 MED ORDER — KETOROLAC TROMETHAMINE 15 MG/ML IJ SOLN
15.0000 mg | Freq: Once | INTRAMUSCULAR | Status: AC
  Administered 2018-06-23: 15 mg via INTRAVENOUS
  Filled 2018-06-23: qty 1

## 2018-06-23 MED ORDER — ACETAMINOPHEN 500 MG PO TABS
1000.0000 mg | ORAL_TABLET | Freq: Once | ORAL | Status: AC
  Administered 2018-06-23: 1000 mg via ORAL
  Filled 2018-06-23: qty 2

## 2018-06-23 MED ORDER — SODIUM CHLORIDE 0.9 % IV BOLUS
1000.0000 mL | Freq: Once | INTRAVENOUS | Status: AC
  Administered 2018-06-23: 1000 mL via INTRAVENOUS

## 2018-06-23 MED ORDER — AZITHROMYCIN 500 MG IV SOLR
500.0000 mg | INTRAVENOUS | Status: DC
  Filled 2018-06-23: qty 500

## 2018-06-23 MED ORDER — BENZONATATE 100 MG PO CAPS
100.0000 mg | ORAL_CAPSULE | Freq: Once | ORAL | Status: AC
  Administered 2018-06-23: 100 mg via ORAL
  Filled 2018-06-23: qty 1

## 2018-06-23 MED ORDER — SODIUM CHLORIDE 0.9 % IV SOLN
2.0000 g | INTRAVENOUS | Status: DC
  Filled 2018-06-23: qty 20

## 2018-06-23 MED ORDER — IPRATROPIUM-ALBUTEROL 0.5-2.5 (3) MG/3ML IN SOLN
3.0000 mL | Freq: Once | RESPIRATORY_TRACT | Status: AC
  Administered 2018-06-23: 3 mL via RESPIRATORY_TRACT
  Filled 2018-06-23: qty 3

## 2018-06-23 NOTE — ED Provider Notes (Signed)
[facility] EMERGENCY DEPARTMENT Provider Note   CSN: [id number] Arrival date & time: 06/23/18  2042     History   Chief Complaint Chief Complaint  Patient presents with  . Chest Pain    HPI [name] is a 52 y.o. female.  52 yo F with a cc of cough, fever.  Going on for the past week.  Worsening over the past couple days.  Unsure of sick contacts.  Denies myalgias, n/v/d.    Chest pain worse with coughing.  Non exertional.  Leg swelling chronic per her.   The history is provided by the patient.  Chest Pain   This is a new problem. The current episode started 2 days ago. The problem occurs constantly. The problem has not changed since onset.The pain is associated with coughing. The pain is present in the substernal region. The pain is at a severity of 5/10. The pain is mild. The quality of the pain is described as brief. The pain does not radiate. Duration of episode(s) is 2 days. Associated symptoms include cough and a fever. Pertinent negatives include no dizziness, no headaches, no nausea, no palpitations, no shortness of breath and no vomiting. She has tried nothing for the symptoms. The treatment provided no relief.  Pertinent negatives for past medical history include no CHF and no MI.    Past Medical History:  Diagnosis Date  . Anxiety   . COMMON MIGRAINE 10/07/2010  . Decreased visual acuity 11/10/2016  . Depression 12/18/2012  . Diabetes mellitus type II, uncontrolled ([place]) 10/07/2010   Qualifier: Diagnosis of  By: Charlett Blake MD, [name]    . Diabetic foot infection ([place]) 08/26/2016  . Disturbance of skin sensation 10/07/2010  . Heart murmur   . History of kidney stones    "years ago"  . Hyperlipidemia 12/06/2010  . Hypertension   . Lipoma of abdominal wall 10/05/2016  . Overweight(278.02) 12/06/2010  . PERIPHERAL NEUROPATHY, FEET 10/07/2010  . PVD (peripheral vascular disease) ([place]) 01/21/2012  . RESTLESS LEG SYNDROME 10/25/2010  . Stroke [facility])  2014, 2017   most recently in 2/17 - intracerebral hemorrhage    Patient Active Problem List   Diagnosis Date Noted  . Chest pain 06/23/2018  . Glaucoma 09/17/2017  . Diabetic retinopathy ([place]) 09/17/2017  . Otitis media 11/10/2016  . Decreased visual acuity 11/10/2016  . Spinal stenosis, lumbar region with neurogenic claudication 10/22/2016  . Other spondylosis with radiculopathy, lumbar region 10/13/2016  . Lipoma of abdominal wall 10/05/2016  . Acute osteomyelitis of toe, left ([place]) 09/05/2016  . Moderate malnutrition ([place]) 09/04/2016  . Anemia of chronic disease 09/04/2016  . Diabetic foot infection ([place]) 08/26/2016  . Diabetic polyneuropathy associated with diabetes mellitus due to underlying condition ([place]) 05/07/2016  . Orthostatic hypotension 05/07/2016  . Cerebrovascular disease 04/27/2016  . ICH (intracerebral hemorrhage) ([place]) 03/30/2016  . Visit for preventive health examination 05/21/2015  . Breast cancer screening 05/21/2015  . Arm lesion 07/02/2014  . Pain in joint, ankle and foot 04/28/2014  . Pain in limb 01/13/2014  . Neuropathic pain of both legs 01/13/2014  . Hip pain 01/12/2014  . Allergic rhinitis 03/10/2013  . Back pain 03/08/2013  . Depression 12/18/2012  . PVD (peripheral vascular disease) ([place]) 01/21/2012  . Wound of left leg 12/22/2011  . Hyperlipidemia 12/06/2010  . Overweight(278.02) 12/06/2010  . RESTLESS LEG SYNDROME 10/25/2010  . Migraine without aura 10/07/2010  . Hereditary and idiopathic peripheral neuropathy 10/07/2010  . Essential hypertension, benign 10/07/2010  .  DISTURBANCE OF SKIN SENSATION 10/07/2010  . HEART MURMUR, HX OF 10/07/2010  . NEPHROLITHIASIS, HX OF 10/07/2010    Past Surgical History:  Procedure Laterality Date  . AMPUTATION TOE Left 09/09/2016   Procedure: AMPUTATION OF LEFT GREAT TOE;  Surgeon: Milly Jakob, MD;  Location: [place];  Service: Orthopedics;  Laterality: Left;  . WISDOM TOOTH  EXTRACTION       OB History   None      Home Medications    Prior to Admission medications   Medication Sig Start Date End Date Taking? Authorizing Provider  aspirin EC 81 MG tablet Take 81 mg by mouth daily.   Yes [provider]  azelastine (ASTELIN) 0.1 % nasal spray Place 2 sprays into both nostrils 2 (two) times daily. Use in each nostril as directed Patient taking differently: Place 2 sprays into both nostrils 2 (two) times daily as needed for allergies.  11/19/16  Yes Saguier, Percell Miller, PA-C  escitalopram (LEXAPRO) 10 MG tablet Take 1 tablet (10 mg total) by mouth daily. 11/10/16  Yes Mosie Lukes, MD  fluticasone (FLONASE) 50 MCG/ACT nasal spray Place 2 sprays into both nostrils daily. Patient taking differently: Place 2 sprays into both nostrils daily as needed for allergies.  11/19/16  Yes Saguier, Percell Miller, PA-C  furosemide (LASIX) 20 MG tablet TAKE 2 TABS DAILY X5DAYS, TAKE 1 TAB DAILY X5DAYS. THEN TAKE 1 TAB DAILY AS NEEDED FOR EDEMA Patient taking differently: Take 20 mg by mouth daily as needed for fluid.  08/24/17  Yes Mosie Lukes, MD  gabapentin (NEURONTIN) 100 MG capsule Take 3 capsules (300 mg total) by mouth 3 (three) times daily. 03/02/17  Yes Mosie Lukes, MD  LANTUS SOLOSTAR 100 UNIT/ML Solostar Pen 100 units sq in am and 50 units sq in pm Patient taking differently: Inject 50-100 Units into the skin See admin instructions. Use 100 units every morning and use 50 units every evening 04/19/18  Yes Mosie Lukes, MD  levothyroxine (SYNTHROID, LEVOTHROID) 25 MCG tablet Take 1 tablet (25 mcg total) by mouth daily before breakfast. 11/20/17  Yes Mosie Lukes, MD  lisinopril (PRINIVIL,ZESTRIL) 20 MG tablet Take 1 tablet (20 mg total) by mouth 2 (two) times daily. 11/10/16  Yes Mosie Lukes, MD  meloxicam (MOBIC) 7.5 MG tablet TAKE 1 TABLET BY MOUTH EVERY DAY Patient taking differently: Take 7.5 mg by mouth daily.  08/05/17  Yes Mosie Lukes, MD  metFORMIN  (GLUCOPHAGE) 1000 MG tablet TAKE 1 TABLET TWICE DAILY AND 1/2 TABLET AT NOON Patient taking differently: Take 500-1,000 mg by mouth See admin instructions. Take 1 tablet every morning then take 1/2 tablet at lunch and at bedtime 04/01/16  Yes Mosie Lukes, MD  simvastatin (ZOCOR) 10 MG tablet Take 1 tablet (10 mg total) by mouth daily. 10/14/16  Yes Mosie Lukes, MD  tizanidine (ZANAFLEX) 2 MG capsule Take 1 capsule (2 mg total) by mouth 2 (two) times daily as needed for muscle spasms. 12/02/17  Yes Mosie Lukes, MD  doxycycline (VIBRAMYCIN) 100 MG capsule Take 1 capsule (100 mg total) by mouth 2 (two) times daily. Patient not taking: Reported on 06/23/2018 06/07/18   Brunetta Jeans, PA-C  HYDROcodone-acetaminophen [facility]) 10-325 MG tablet Take 1-2 tablets by mouth every 6 (six) hours as needed (pain). Patient not taking: Reported on 06/23/2018 03/16/18   Mosie Lukes, MD  ONE TOUCH ULTRA TEST test strip USE ONCE DAILY TO CHECK BLOOD SUGAR. DX  E11.9 02/08/18   [provider]  ONE TOUCH ULTRA TEST test strip USE ONCE DAILY TO CHECK BLOOD SUGAR. DX E11.9 03/29/18   Mosie Lukes, MD  silver sulfADIAZINE (SILVADENE) 1 % cream Apply 1 application topically daily. Patient not taking: Reported on 06/23/2018 08/13/17   Shelda Pal, DO    Family History Family History  Problem Relation Age of Onset  . Arthritis Mother   . Stroke Brother        previous smoker  . Alcohol abuse Brother        in remission  . Leukemia Brother   . Diabetes Paternal Grandmother   . Healthy Son     Social History Social History   Tobacco Use  . Smoking status: Former Smoker    Packs/day: 0.50    Last attempt to quit: 09/08/2005    Years since quitting: 12.7  . Smokeless tobacco: Never Used  Substance Use Topics  . Alcohol use: No    Alcohol/week: 0.0 standard drinks  . Drug use: No     Allergies   Lyrica [pregabalin]; Morphine and related; and Tramadol   Review of  Systems Review of Systems  Constitutional: Positive for fever. Negative for chills.  HENT: Negative for congestion and rhinorrhea.   Eyes: Negative for redness and visual disturbance.  Respiratory: Positive for cough. Negative for shortness of breath and wheezing.   Cardiovascular: Positive for chest pain. Negative for palpitations.  Gastrointestinal: Negative for nausea and vomiting.  Genitourinary: Negative for dysuria and urgency.  Musculoskeletal: Negative for arthralgias and myalgias.  Skin: Negative for pallor and wound.  Neurological: Negative for dizziness and headaches.     Physical Exam Updated Vital Signs BP (!) 154/72   Pulse 91   Temp 98.8 F (37.1 C)   Resp (!) 22   LMP 02/12/2011 (Exact Date)   SpO2 96%   Physical Exam  Constitutional: She is oriented to person, place, and time. She appears well-developed and well-nourished. No distress.  HENT:  Head: Normocephalic and atraumatic.  Swollen turbinates, posterior nasal drip, no noted sinus ttp, tm normal bilaterally.    Eyes: Pupils are equal, round, and reactive to light. EOM are normal.  Neck: Normal range of motion. Neck supple.  Cardiovascular: Normal rate and regular rhythm. Exam reveals no gallop and no friction rub.  No murmur heard. Pulmonary/Chest: Effort normal. She has no wheezes. She has no rales.  Abdominal: Soft. She exhibits no distension and no mass. There is no tenderness. There is no guarding.  Musculoskeletal: She exhibits edema (3+ to BLE). She exhibits no tenderness.  Neurological: She is alert and oriented to person, place, and time.  Skin: Skin is warm and dry. She is not diaphoretic.  Psychiatric: She has a normal mood and affect. Her behavior is normal.  Nursing note and vitals reviewed.    ED Treatments / Results  Labs (all labs ordered are listed, but only abnormal results are displayed) Labs Reviewed  COMPREHENSIVE METABOLIC PANEL - Abnormal; Notable for the following  components:      Result Value   Potassium 3.3 (*)    Glucose, Bld 137 (*)    Creatinine, Ser 1.01 (*)    Calcium 8.8 (*)    Albumin 2.6 (*)    All other components within normal limits  CBC WITH DIFFERENTIAL/PLATELET - Abnormal; Notable for the following components:   WBC 11.1 (*)    RBC 3.73 (*)    Hemoglobin 9.7 (*)  HCT 32.4 (*)    MCHC 29.9 (*)    Platelets 570 (*)    Neutro Abs 9.0 (*)    All other components within normal limits  BRAIN NATRIURETIC PEPTIDE - Abnormal; Notable for the following components:   B Natriuretic Peptide 335.2 (*)    All other components within normal limits  URINALYSIS, ROUTINE W REFLEX MICROSCOPIC - Abnormal; Notable for the following components:   APPearance HAZY (*)    Glucose, UA 150 (*)    Hgb urine dipstick MODERATE (*)    Protein, ur >=300 (*)    Bacteria, UA RARE (*)    All other components within normal limits  I-STAT TROPONIN, ED - Abnormal; Notable for the following components:   Troponin i, poc 0.40 (*)    All other components within normal limits  CULTURE, BLOOD (ROUTINE X 2)  CULTURE, BLOOD (ROUTINE X 2)  INFLUENZA PANEL BY PCR (TYPE A & B)  I-STAT CG4 LACTIC ACID, ED  I-STAT CG4 LACTIC ACID, ED    EKG EKG Interpretation  Date/Time:  Wednesday June 23 2018 20:47:38 EDT Ventricular Rate:  104 PR Interval:  144 QRS Duration: 74 QT Interval:  316 QTC Calculation: 415 R Axis:   48 Text Interpretation:  Sinus tachycardia Left ventricular hypertrophy with repolarization abnormality Abnormal ECG st changes in the inferior leads not seen on prior Otherwise no significant change Confirmed by Deno Etienne [phone]) on 06/23/2018 9:08:18 PM   Radiology Dg Chest 2 View  Result Date: 06/23/2018 CLINICAL DATA:  Shortness of breath with dry cough for 3 days. No fever. EXAM: CHEST - 2 VIEW COMPARISON:  03/09/2018 FINDINGS: Borderline heart size that is stable. There is diffuse interstitial opacity with [employer]. Possible trace  effusions. IMPRESSION: CHF pattern. Electronically Signed   By: Monte Fantasia M.D.   On: 06/23/2018 21:33    Procedures Procedures (including critical care time)  Medications Ordered in ED Medications  ipratropium-albuterol (DUONEB) 0.5-2.5 (3) MG/3ML nebulizer solution 3 mL (3 mLs Nebulization Given 06/23/18 2155)  sodium chloride 0.9 % bolus 1,000 mL (0 mLs Intravenous Stopped 06/23/18 2325)  acetaminophen (TYLENOL) tablet 1,000 mg (1,000 mg Oral Given 06/23/18 2209)  ketorolac (TORADOL) 15 MG/ML injection 15 mg (15 mg Intravenous Given 06/23/18 2210)  benzonatate (TESSALON) capsule 100 mg (100 mg Oral Given 06/23/18 2209)  aspirin chewable tablet 324 mg (324 mg Oral Given 06/23/18 2329)     Initial Impression / Assessment and Plan / ED Course  I have reviewed the triage vital signs and the nursing notes.  Pertinent labs & imaging results that were available during my care of the patient were reviewed by me and considered in my medical decision making (see chart for details).     52 yo F with a cc of chest pain, cough and fever.  CXR viewed by me with likely viral syndrome.  Lab work with mildly low K at 3.3  Lactic normal.  She does have lower extremity edema though states this is normal for her.  No hx of heart failure, has lasix on her med list for extremity edema.    The patient has a + trop.  Due to her symptoms I would suspect more likely myocarditis than primary MI.  She does have downsloping ST segments in the inferior leads. Will discuss with cards.   Discussed with hospitalist, will admit.   CRITICAL CARE Performed by: Cecilio Asper   Total critical care time: 35 minutes  Critical care time was  exclusive of separately billable procedures and treating other patients.  Critical care was necessary to treat or prevent imminent or life-threatening deterioration.  Critical care was time spent personally by me on the following activities: development of treatment  plan with patient and/or surrogate as well as nursing, discussions with consultants, evaluation of patient's response to treatment, examination of patient, obtaining history from patient or surrogate, ordering and performing treatments and interventions, ordering and review of laboratory studies, ordering and review of radiographic studies, pulse oximetry and re-evaluation of patient's condition.  The patients results and plan were reviewed and discussed.   Any x-rays performed were independently reviewed by myself.   Differential diagnosis were considered with the presenting HPI.  Medications  ipratropium-albuterol (DUONEB) 0.5-2.5 (3) MG/3ML nebulizer solution 3 mL (3 mLs Nebulization Given 06/23/18 2155)  sodium chloride 0.9 % bolus 1,000 mL (0 mLs Intravenous Stopped 06/23/18 2325)  acetaminophen (TYLENOL) tablet 1,000 mg (1,000 mg Oral Given 06/23/18 2209)  ketorolac (TORADOL) 15 MG/ML injection 15 mg (15 mg Intravenous Given 06/23/18 2210)  benzonatate (TESSALON) capsule 100 mg (100 mg Oral Given 06/23/18 2209)  aspirin chewable tablet 324 mg (324 mg Oral Given 06/23/18 2329)    Vitals:   06/23/18 2215 06/23/18 2230 06/23/18 2300 06/23/18 2324  BP: (!) 168/93 (!) 168/85 (!) 154/72   Pulse: (!) 106 96 91   Resp: (!) 21 (!) 21 (!) 22   Temp:    98.8 F (37.1 C)  TempSrc:      SpO2: 97% 98% 96%     Final diagnoses:  Acute viral myocarditis    Admission/ observation were discussed with the admitting physician, patient and/or family and they are comfortable with the plan.   Final Clinical Impressions(s) / ED Diagnoses   Final diagnoses:  Acute viral myocarditis    ED Discharge Orders    None       Deno Etienne, DO 06/24/18 1034
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
## 2018-06-23 NOTE — ED Notes (Signed)
Code sepsis cancelledd by drf floyd

## 2018-06-23 NOTE — ED Triage Notes (Signed)
Patient reports left chest pain onset 3 days ago with SOB and dry cough , denies emesis or diaphoresis , denies fever or chills , rates pain ( dull) 5/10 at triage . Patient evaluated by PA at triage .

## 2018-06-23 NOTE — ED Notes (Signed)
Pt had a code sepsis  Called in triage then the pt was sent to [facility]  She is still not back  Delays due  Start of treatment

## 2018-06-23 NOTE — ED Notes (Signed)
pthas swelling all over her body  For 2-3 days  C/o chest pain pitting edema in bi-lateral lower legs.  She reports that she does not feel like she can get her breath  sats 97-99%

## 2018-06-24 ENCOUNTER — Other Ambulatory Visit: Payer: Self-pay

## 2018-06-24 ENCOUNTER — Encounter (HOSPITAL_COMMUNITY): Payer: Self-pay | Admitting: Internal Medicine

## 2018-06-24 ENCOUNTER — Ambulatory Visit (HOSPITAL_BASED_OUTPATIENT_CLINIC_OR_DEPARTMENT_OTHER): Payer: BLUE CROSS/BLUE SHIELD

## 2018-06-24 ENCOUNTER — Encounter (HOSPITAL_COMMUNITY)
Admission: EM | Disposition: A | Discharge status: Home Health | Payer: Self-pay | Source: Home / Self Care | Attending: Thoracic Surgery (Cardiothoracic Vascular Surgery)

## 2018-06-24 ENCOUNTER — Observation Stay (HOSPITAL_BASED_OUTPATIENT_CLINIC_OR_DEPARTMENT_OTHER): Payer: BLUE CROSS/BLUE SHIELD

## 2018-06-24 DIAGNOSIS — E1159 Type 2 diabetes mellitus with other circulatory complications: Secondary | ICD-10-CM | POA: Diagnosis not present

## 2018-06-24 DIAGNOSIS — R079 Chest pain, unspecified: Secondary | ICD-10-CM

## 2018-06-24 DIAGNOSIS — I5033 Acute on chronic diastolic (congestive) heart failure: Secondary | ICD-10-CM | POA: Diagnosis not present

## 2018-06-24 DIAGNOSIS — M79609 Pain in unspecified limb: Secondary | ICD-10-CM | POA: Diagnosis not present

## 2018-06-24 DIAGNOSIS — I214 Non-ST elevation (NSTEMI) myocardial infarction: Principal | ICD-10-CM

## 2018-06-24 DIAGNOSIS — E119 Type 2 diabetes mellitus without complications: Secondary | ICD-10-CM | POA: Diagnosis not present

## 2018-06-24 DIAGNOSIS — I739 Peripheral vascular disease, unspecified: Secondary | ICD-10-CM

## 2018-06-24 DIAGNOSIS — I25110 Atherosclerotic heart disease of native coronary artery with unstable angina pectoris: Secondary | ICD-10-CM | POA: Diagnosis not present

## 2018-06-24 DIAGNOSIS — I10 Essential (primary) hypertension: Secondary | ICD-10-CM | POA: Diagnosis not present

## 2018-06-24 HISTORY — DX: Acute on chronic diastolic (congestive) heart failure: I50.33

## 2018-06-24 HISTORY — PX: CARDIAC CATHETERIZATION: SHX172

## 2018-06-24 HISTORY — PX: LEFT HEART CATH AND CORONARY ANGIOGRAPHY: CATH118249

## 2018-06-24 LAB — CBC
HCT: 27.2 % — ABNORMAL LOW (ref 36.0–46.0)
Hemoglobin: 8.3 g/dL — ABNORMAL LOW (ref 12.0–15.0)
MCH: 26.5 pg (ref 26.0–34.0)
MCHC: 30.5 g/dL (ref 30.0–36.0)
MCV: 86.9 fL (ref 80.0–100.0)
Platelets: 484 10*3/uL — ABNORMAL HIGH (ref 150–400)
RBC: 3.13 MIL/uL — ABNORMAL LOW (ref 3.87–5.11)
RDW: 13.5 % (ref 11.5–15.5)
WBC: 7.2 10*3/uL (ref 4.0–10.5)
nRBC: 0 % (ref 0.0–0.2)

## 2018-06-24 LAB — HEPATIC FUNCTION PANEL
ALT: 20 U/L (ref 0–44)
AST: 22 U/L (ref 15–41)
Albumin: 2.6 g/dL — ABNORMAL LOW (ref 3.5–5.0)
Alkaline Phosphatase: 87 U/L (ref 38–126)
Bilirubin, Direct: <0.1 mg/dL (ref 0.0–0.2)
Total Bilirubin: 0.6 mg/dL (ref 0.3–1.2)
Total Protein: 6.9 g/dL (ref 6.5–8.1)

## 2018-06-24 LAB — INFLUENZA PANEL BY PCR (TYPE A & B)
Influenza A By PCR: NEGATIVE
Influenza B By PCR: NEGATIVE

## 2018-06-24 LAB — BASIC METABOLIC PANEL
Anion gap: 11 (ref 5–15)
BUN: 19 mg/dL (ref 6–20)
CO2: 23 mmol/L (ref 22–32)
Calcium: 8.5 mg/dL — ABNORMAL LOW (ref 8.9–10.3)
Chloride: 108 mmol/L (ref 98–111)
Creatinine, Ser: 1.08 mg/dL — ABNORMAL HIGH (ref 0.44–1.00)
GFR calc Af Amer: >60 mL/min (ref >60)
GFR calc non Af Amer: 57 mL/min — ABNORMAL LOW (ref >60)
Glucose, Bld: 129 mg/dL — ABNORMAL HIGH (ref 70–99)
Potassium: 3.5 mmol/L (ref 3.5–5.1)
Sodium: 142 mmol/L (ref 135–145)

## 2018-06-24 LAB — IRON AND TIBC
Iron: 15 ug/dL — ABNORMAL LOW (ref 28–170)
Saturation Ratios: 6 % — ABNORMAL LOW (ref 10.4–31.8)
TIBC: 263 ug/dL (ref 250–450)
UIBC: 248 ug/dL

## 2018-06-24 LAB — RETICULOCYTES
Immature Retic Fract: 16.7 % — ABNORMAL HIGH (ref 2.3–15.9)
RBC.: 3.13 MIL/uL — ABNORMAL LOW (ref 3.87–5.11)
Retic Count, Absolute: 48.5 10*3/uL (ref 19.0–186.0)
Retic Ct Pct: 1.6 % (ref 0.4–3.1)

## 2018-06-24 LAB — ECHOCARDIOGRAM COMPLETE
Height: 62 in
Weight: 2812.8 oz

## 2018-06-24 LAB — LIPID PANEL
Cholesterol: 200 mg/dL (ref 0–200)
HDL: 36 mg/dL — ABNORMAL LOW (ref >40)
LDL Cholesterol: 139 mg/dL — ABNORMAL HIGH (ref 0–99)
Total CHOL/HDL Ratio: 5.6 RATIO
Triglycerides: 127 mg/dL (ref <150)
VLDL: 25 mg/dL (ref 0–40)

## 2018-06-24 LAB — GLUCOSE, CAPILLARY
Glucose-Capillary: 107 mg/dL — ABNORMAL HIGH (ref 70–99)
Glucose-Capillary: 91 mg/dL (ref 70–99)
Glucose-Capillary: 133 mg/dL — ABNORMAL HIGH (ref 70–99)
Glucose-Capillary: 96 mg/dL (ref 70–99)
Glucose-Capillary: 202 mg/dL — ABNORMAL HIGH (ref 70–99)

## 2018-06-24 LAB — HIV ANTIBODY (ROUTINE TESTING W REFLEX): HIV Screen 4th Generation wRfx: NONREACTIVE

## 2018-06-24 LAB — TROPONIN I
Troponin I: 0.64 ng/mL (ref <0.03)
Troponin I: 0.6 ng/mL (ref <0.03)
Troponin I: 0.49 ng/mL (ref <0.03)

## 2018-06-24 LAB — PROTIME-INR
INR: 1.26
Prothrombin Time: 15.7 seconds — ABNORMAL HIGH (ref 11.4–15.2)

## 2018-06-24 LAB — FERRITIN: Ferritin: 65 ng/mL (ref 11–307)

## 2018-06-24 LAB — VITAMIN B12: Vitamin B-12: 290 pg/mL (ref 180–914)

## 2018-06-24 LAB — MAGNESIUM: Magnesium: 1.8 mg/dL (ref 1.7–2.4)

## 2018-06-24 LAB — SEDIMENTATION RATE: Sed Rate: 115 mm/hr — ABNORMAL HIGH (ref 0–22)

## 2018-06-24 LAB — FOLATE: Folate: 16.8 ng/mL (ref >5.9)

## 2018-06-24 LAB — TSH: TSH: 2.674 u[IU]/mL (ref 0.350–4.500)

## 2018-06-24 SURGERY — LEFT HEART CATH AND CORONARY ANGIOGRAPHY
Anesthesia: LOCAL

## 2018-06-24 MED ORDER — INSULIN GLARGINE 100 UNIT/ML ~~LOC~~ SOLN: 50.0000 [IU] | Freq: Every day | SUBCUTANEOUS | Status: DC

## 2018-06-24 MED ORDER — ACETAMINOPHEN 325 MG PO TABS: 650.0000 mg | ORAL_TABLET | ORAL | Status: DC | PRN

## 2018-06-24 MED ORDER — MUPIROCIN 2 % EX OINT
TOPICAL_OINTMENT | Freq: Every day | CUTANEOUS | Status: DC
  Administered 2018-06-24: 1 via TOPICAL
  Administered 2018-06-25 – 2018-06-30 (×7): via TOPICAL
  Administered 2018-07-01: 1 via TOPICAL
  Administered 2018-07-02: 10:00:00 via TOPICAL
  Filled 2018-06-24: qty 22

## 2018-06-24 MED ORDER — SODIUM CHLORIDE 0.9% FLUSH
3.0000 mL | Freq: Two times a day (BID) | INTRAVENOUS | Status: DC
  Administered 2018-06-24: 3 mL via INTRAVENOUS

## 2018-06-24 MED ORDER — CARVEDILOL 6.25 MG PO TABS
6.2500 mg | ORAL_TABLET | Freq: Two times a day (BID) | ORAL | Status: DC
  Administered 2018-06-24 – 2018-06-28 (×8): 6.25 mg via ORAL
  Filled 2018-06-24 (×8): qty 1

## 2018-06-24 MED ORDER — FENTANYL CITRATE (PF) 100 MCG/2ML IJ SOLN
INTRAMUSCULAR | Status: AC
  Filled 2018-06-24: qty 2

## 2018-06-24 MED ORDER — HEPARIN (PORCINE) IN NACL 1000-0.9 UT/500ML-% IV SOLN
INTRAVENOUS | Status: DC | PRN
  Administered 2018-06-24 (×2): 500 mL

## 2018-06-24 MED ORDER — HEPARIN (PORCINE) IN NACL 100-0.45 UNIT/ML-% IJ SOLN
1400.0000 [IU]/h | INTRAMUSCULAR | Status: DC
  Administered 2018-06-25: 800 [IU]/h via INTRAVENOUS
  Administered 2018-06-26: 1300 [IU]/h via INTRAVENOUS
  Administered 2018-06-26 – 2018-06-28 (×3): 1400 [IU]/h via INTRAVENOUS
  Filled 2018-06-24 (×5): qty 250

## 2018-06-24 MED ORDER — TIZANIDINE HCL 2 MG PO TABS
2.0000 mg | ORAL_TABLET | Freq: Two times a day (BID) | ORAL | Status: DC | PRN
  Administered 2018-06-25 – 2018-06-26 (×2): 2 mg via ORAL
  Filled 2018-06-24 (×3): qty 1

## 2018-06-24 MED ORDER — FUROSEMIDE 10 MG/ML IJ SOLN
40.0000 mg | Freq: Two times a day (BID) | INTRAMUSCULAR | Status: DC
  Administered 2018-06-24 – 2018-06-26 (×5): 40 mg via INTRAVENOUS
  Filled 2018-06-24 (×5): qty 4

## 2018-06-24 MED ORDER — NITROGLYCERIN 1 MG/10 ML FOR IR/CATH LAB
INTRA_ARTERIAL | Status: AC
  Filled 2018-06-24: qty 10

## 2018-06-24 MED ORDER — LIDOCAINE HCL (PF) 1 % IJ SOLN
INTRAMUSCULAR | Status: AC
  Filled 2018-06-24: qty 30

## 2018-06-24 MED ORDER — SODIUM CHLORIDE 0.9 % IV SOLN: 250.0000 mL | INTRAVENOUS | Status: DC | PRN

## 2018-06-24 MED ORDER — GABAPENTIN 300 MG PO CAPS
300.0000 mg | ORAL_CAPSULE | Freq: Three times a day (TID) | ORAL | Status: DC
  Administered 2018-06-24 – 2018-06-27 (×11): 300 mg via ORAL
  Filled 2018-06-24 (×11): qty 1

## 2018-06-24 MED ORDER — FENTANYL CITRATE (PF) 100 MCG/2ML IJ SOLN
INTRAMUSCULAR | Status: DC | PRN
  Administered 2018-06-24: 25 ug via INTRAVENOUS

## 2018-06-24 MED ORDER — VERAPAMIL HCL 2.5 MG/ML IV SOLN
INTRAVENOUS | Status: DC | PRN
  Administered 2018-06-24: 10 mL via INTRA_ARTERIAL

## 2018-06-24 MED ORDER — LEVOTHYROXINE SODIUM 25 MCG PO TABS
25.0000 ug | ORAL_TABLET | Freq: Every day | ORAL | Status: DC
  Administered 2018-06-24 – 2018-06-27 (×4): 25 ug via ORAL
  Filled 2018-06-24 (×4): qty 1

## 2018-06-24 MED ORDER — ESCITALOPRAM OXALATE 10 MG PO TABS
10.0000 mg | ORAL_TABLET | Freq: Every day | ORAL | Status: DC
  Administered 2018-06-24 – 2018-06-27 (×4): 10 mg via ORAL
  Filled 2018-06-24 (×4): qty 1

## 2018-06-24 MED ORDER — NITROGLYCERIN 0.4 MG SL SUBL
0.4000 mg | SUBLINGUAL_TABLET | SUBLINGUAL | Status: DC | PRN
  Administered 2018-06-24: 0.4 mg via SUBLINGUAL

## 2018-06-24 MED ORDER — DIAZEPAM 5 MG PO TABS: 5.0000 mg | ORAL_TABLET | Freq: Four times a day (QID) | ORAL | Status: DC | PRN

## 2018-06-24 MED ORDER — ONDANSETRON HCL 4 MG/2ML IJ SOLN: 4.0000 mg | Freq: Four times a day (QID) | INTRAMUSCULAR | Status: DC | PRN

## 2018-06-24 MED ORDER — ASPIRIN 81 MG PO CHEW: 81.0000 mg | CHEWABLE_TABLET | Freq: Every day | ORAL | Status: DC

## 2018-06-24 MED ORDER — SIMVASTATIN 10 MG PO TABS
10.0000 mg | ORAL_TABLET | Freq: Every day | ORAL | Status: DC
  Administered 2018-06-24: 10 mg via ORAL
  Filled 2018-06-24: qty 1

## 2018-06-24 MED ORDER — MIDAZOLAM HCL 2 MG/2ML IJ SOLN
INTRAMUSCULAR | Status: DC | PRN
  Administered 2018-06-24: 2 mg via INTRAVENOUS

## 2018-06-24 MED ORDER — HEPARIN SODIUM (PORCINE) 1000 UNIT/ML IJ SOLN
INTRAMUSCULAR | Status: DC | PRN
  Administered 2018-06-24: 4000 [IU] via INTRAVENOUS

## 2018-06-24 MED ORDER — SODIUM CHLORIDE 0.9 % IV SOLN: INTRAVENOUS | Status: AC

## 2018-06-24 MED ORDER — SODIUM CHLORIDE 0.9% FLUSH: 3.0000 mL | INTRAVENOUS | Status: DC | PRN

## 2018-06-24 MED ORDER — FLUTICASONE PROPIONATE 50 MCG/ACT NA SUSP
2.0000 | Freq: Every day | NASAL | Status: DC | PRN
  Filled 2018-06-24: qty 16

## 2018-06-24 MED ORDER — NITROGLYCERIN 0.4 MG SL SUBL
SUBLINGUAL_TABLET | SUBLINGUAL | Status: AC
  Filled 2018-06-24: qty 1

## 2018-06-24 MED ORDER — ACETAMINOPHEN 650 MG RE SUPP: 650.0000 mg | Freq: Four times a day (QID) | RECTAL | Status: DC | PRN

## 2018-06-24 MED ORDER — VERAPAMIL HCL 2.5 MG/ML IV SOLN
INTRAVENOUS | Status: AC
  Filled 2018-06-24: qty 2

## 2018-06-24 MED ORDER — HEPARIN (PORCINE) IN NACL 1000-0.9 UT/500ML-% IV SOLN
INTRAVENOUS | Status: AC
  Filled 2018-06-24: qty 1000

## 2018-06-24 MED ORDER — ONDANSETRON HCL 4 MG PO TABS: 4.0000 mg | ORAL_TABLET | Freq: Four times a day (QID) | ORAL | Status: DC | PRN

## 2018-06-24 MED ORDER — INSULIN ASPART 100 UNIT/ML ~~LOC~~ SOLN
0.0000 [IU] | Freq: Three times a day (TID) | SUBCUTANEOUS | Status: DC
  Administered 2018-06-24 – 2018-06-26 (×3): 1 [IU] via SUBCUTANEOUS
  Administered 2018-06-27: 2 [IU] via SUBCUTANEOUS

## 2018-06-24 MED ORDER — POTASSIUM CHLORIDE CRYS ER 20 MEQ PO TBCR
20.0000 meq | EXTENDED_RELEASE_TABLET | Freq: Every day | ORAL | Status: DC
  Administered 2018-06-24 – 2018-06-26 (×3): 20 meq via ORAL
  Filled 2018-06-24 (×4): qty 1

## 2018-06-24 MED ORDER — INSULIN GLARGINE 100 UNIT/ML ~~LOC~~ SOLN
50.0000 [IU] | Freq: Every day | SUBCUTANEOUS | Status: DC
  Administered 2018-06-24 – 2018-06-27 (×4): 50 [IU] via SUBCUTANEOUS
  Filled 2018-06-24 (×4): qty 0.5

## 2018-06-24 MED ORDER — ASPIRIN EC 81 MG PO TBEC
81.0000 mg | DELAYED_RELEASE_TABLET | Freq: Every day | ORAL | Status: DC
  Administered 2018-06-24 – 2018-06-27 (×4): 81 mg via ORAL
  Filled 2018-06-24 (×4): qty 1

## 2018-06-24 MED ORDER — NITROGLYCERIN 1 MG/10 ML FOR IR/CATH LAB
INTRA_ARTERIAL | Status: DC | PRN
  Administered 2018-06-24: 200 ug via INTRACORONARY

## 2018-06-24 MED ORDER — HEPARIN BOLUS VIA INFUSION
4000.0000 [IU] | Freq: Once | INTRAVENOUS | Status: AC
  Administered 2018-06-24: 4000 [IU] via INTRAVENOUS
  Filled 2018-06-24: qty 4000

## 2018-06-24 MED ORDER — LIDOCAINE HCL (PF) 1 % IJ SOLN
INTRAMUSCULAR | Status: DC | PRN
  Administered 2018-06-24: 2 mL

## 2018-06-24 MED ORDER — MIDAZOLAM HCL 2 MG/2ML IJ SOLN
INTRAMUSCULAR | Status: AC
  Filled 2018-06-24: qty 2

## 2018-06-24 MED ORDER — HEPARIN (PORCINE) IN NACL 100-0.45 UNIT/ML-% IJ SOLN
800.0000 [IU]/h | INTRAMUSCULAR | Status: DC
  Administered 2018-06-24: 800 [IU]/h via INTRAVENOUS
  Filled 2018-06-24: qty 250

## 2018-06-24 MED ORDER — ACETAMINOPHEN 325 MG PO TABS: 650.0000 mg | ORAL_TABLET | Freq: Four times a day (QID) | ORAL | Status: DC | PRN

## 2018-06-24 MED ORDER — IOHEXOL 350 MG/ML SOLN
INTRAVENOUS | Status: DC | PRN
  Administered 2018-06-24: 90 mL via INTRA_ARTERIAL

## 2018-06-24 MED ORDER — LISINOPRIL 20 MG PO TABS
20.0000 mg | ORAL_TABLET | Freq: Two times a day (BID) | ORAL | Status: DC
  Administered 2018-06-24 – 2018-06-26 (×5): 20 mg via ORAL
  Filled 2018-06-24 (×5): qty 1

## 2018-06-24 MED ORDER — SODIUM CHLORIDE 0.9% FLUSH
3.0000 mL | Freq: Two times a day (BID) | INTRAVENOUS | Status: DC
  Administered 2018-06-24 – 2018-06-27 (×5): 3 mL via INTRAVENOUS

## 2018-06-24 MED ORDER — SODIUM CHLORIDE 0.9 % IV SOLN
INTRAVENOUS | Status: DC
  Administered 2018-06-24: 17:00:00 via INTRAVENOUS

## 2018-06-24 MED ORDER — INSULIN GLARGINE 100 UNIT/ML ~~LOC~~ SOLN
100.0000 [IU] | Freq: Every day | SUBCUTANEOUS | Status: DC
  Filled 2018-06-24: qty 1

## 2018-06-24 MED ORDER — ATORVASTATIN CALCIUM 80 MG PO TABS
80.0000 mg | ORAL_TABLET | Freq: Every day | ORAL | Status: DC
  Administered 2018-06-25 – 2018-07-05 (×10): 80 mg via ORAL
  Filled 2018-06-24 (×10): qty 1

## 2018-06-24 SURGICAL SUPPLY — 12 items
CATH INFINITI 5FR ANG PIGTAIL (CATHETERS) ×2 IMPLANT
CATH OPTITORQUE TIG 4.0 5F (CATHETERS) ×2 IMPLANT
CATH OPTITORQUE TIG 4.5 5F (CATHETERS) ×2 IMPLANT
DEVICE RAD COMP TR BAND LRG (VASCULAR PRODUCTS) ×2 IMPLANT
GLIDESHEATH SLEND SS 6F .021 (SHEATH) ×2 IMPLANT
GUIDEWIRE INQWIRE 1.5J.035X260 (WIRE) ×1 IMPLANT
INQWIRE 1.5J .035X260CM (WIRE) ×2
KIT HEART LEFT (KITS) ×2 IMPLANT
PACK CARDIAC CATHETERIZATION (CUSTOM PROCEDURE TRAY) ×2 IMPLANT
SYR MEDRAD MARK V 150ML (SYRINGE) ×2 IMPLANT
TRANSDUCER W/STOPCOCK (MISCELLANEOUS) ×2 IMPLANT
TUBING CIL FLEX 10 FLL-RA (TUBING) ×2 IMPLANT

## 2018-06-24 NOTE — Interval H&P Note (Signed)
Cath Lab Visit (complete for each Cath Lab visit)  Clinical Evaluation Leading to the Procedure:   ACS: Yes.    Non-ACS:    Anginal Classification: CCS IV  Anti-ischemic medical therapy: No Therapy  Non-Invasive Test Results: No non-invasive testing performed  Prior CABG: No previous CABG      History and Physical Interval Note:  06/24/2018 4:37 PM  [name]  has presented today for surgery, with the diagnosis of cp  The various methods of treatment have been discussed with the patient and family. After consideration of risks, benefits and other options for treatment, the patient has consented to  Procedure(s): LEFT HEART CATH AND CORONARY ANGIOGRAPHY (N/A) as a surgical intervention .  The patient's history has been reviewed, patient examined, no change in status, stable for surgery.  I have reviewed the patient's chart and labs.  Questions were answered to the patient's satisfaction.     Shelva Majestic

## 2018-06-24 NOTE — H&P (Signed)
History and Physical    [name]:[id number] DOB: 01/28/1966 DOA: 06/23/2018  PCP: Mosie Lukes, MD  Patient coming from: Home.  Chief Complaint: Chest pain.  HPI: [name] is a 52 y.o. female with history of diabetes mellitus type 2, stroke, diastolic CHF, peripheral vascular disease, depression presents to the ER because of chest pain and shortness of breath.  Patient has been having the symptoms for the last 1 week mostly exertional.  Patient's husband passed last month and since then patient states she has not been very compliant with the diet but has been taking her medications.  Chest pressure happens on exertion at the same time also has shortness of breath.  Has noticed increasing peripheral edema.  Over the last few days patient also has been having some fever chills and cough.  Denies any recent travel or sick contacts.  ED Course: On exam in the ER patient has significant peripheral edema upper and lower extremities and there is also some ulceration on the second toe and the right foot.  Chest x-ray shows CHF pattern EKG shows sinus tachycardia with LVH.  Troponin is mildly elevated.  Cardiology on-call was consulted.  Since patient was febrile blood cultures obtained flu panel was negative.  Patient admitted for further management of chest pain and CHF.  There was some concern for myopericarditis due to fever.  Review of Systems: As per HPI, rest all negative.   Past Medical History:  Diagnosis Date  . Acute on chronic diastolic CHF (congestive heart failure) ([place]) 06/24/2018  . Anxiety   . COMMON MIGRAINE 10/07/2010  . Decreased visual acuity 11/10/2016  . Depression 12/18/2012  . Diabetes mellitus type II, uncontrolled ([place]) 10/07/2010   Qualifier: Diagnosis of  By: Charlett Blake MD, [name]    . Diabetic foot infection ([place]) 08/26/2016  . Disturbance of skin sensation 10/07/2010  . Heart murmur   . History of kidney stones    "years ago"  . Hyperlipidemia  12/06/2010  . Hypertension   . Lipoma of abdominal wall 10/05/2016  . Overweight(278.02) 12/06/2010  . PERIPHERAL NEUROPATHY, FEET 10/07/2010  . PVD (peripheral vascular disease) ([place]) 01/21/2012  . RESTLESS LEG SYNDROME 10/25/2010  . Stroke [facility]) 2014, 2017   most recently in 2/17 - intracerebral hemorrhage    Past Surgical History:  Procedure Laterality Date  . AMPUTATION TOE Left 09/09/2016   Procedure: AMPUTATION OF LEFT GREAT TOE;  Surgeon: Milly Jakob, MD;  Location: [place];  Service: Orthopedics;  Laterality: Left;  . WISDOM TOOTH EXTRACTION       reports that she quit smoking about 12 years ago. She smoked 0.50 packs per day. She has never used smokeless tobacco. She reports that she does not drink alcohol or use drugs.  Allergies  Allergen Reactions  . Lyrica [Pregabalin] Other (See Comments)    MADE PATIENT VERY EMOTIONAL AND WOULD CRY EASILY  . Morphine And Related Nausea And Vomiting  . Tramadol Nausea And Vomiting    Pt cant tolerate this pain med.     Family History  Problem Relation Age of Onset  . Arthritis Mother   . Stroke Brother        previous smoker  . Alcohol abuse Brother        in remission  . Leukemia Brother   . Diabetes Paternal Grandmother   . Healthy Son     Prior to Admission medications   Medication Sig Start Date End Date Taking? Authorizing  Provider  aspirin EC 81 MG tablet Take 81 mg by mouth daily.   Yes [provider]  azelastine (ASTELIN) 0.1 % nasal spray Place 2 sprays into both nostrils 2 (two) times daily. Use in each nostril as directed Patient taking differently: Place 2 sprays into both nostrils 2 (two) times daily as needed for allergies.  11/19/16  Yes Saguier, Percell Miller, PA-C  escitalopram (LEXAPRO) 10 MG tablet Take 1 tablet (10 mg total) by mouth daily. 11/10/16  Yes Mosie Lukes, MD  fluticasone (FLONASE) 50 MCG/ACT nasal spray Place 2 sprays into both nostrils daily. Patient taking differently:  Place 2 sprays into both nostrils daily as needed for allergies.  11/19/16  Yes Saguier, Percell Miller, PA-C  furosemide (LASIX) 20 MG tablet TAKE 2 TABS DAILY X5DAYS, TAKE 1 TAB DAILY X5DAYS. THEN TAKE 1 TAB DAILY AS NEEDED FOR EDEMA Patient taking differently: Take 20 mg by mouth daily as needed for fluid.  08/24/17  Yes Mosie Lukes, MD  gabapentin (NEURONTIN) 100 MG capsule Take 3 capsules (300 mg total) by mouth 3 (three) times daily. 03/02/17  Yes Mosie Lukes, MD  LANTUS SOLOSTAR 100 UNIT/ML Solostar Pen 100 units sq in am and 50 units sq in pm Patient taking differently: Inject 50-100 Units into the skin See admin instructions. Use 100 units every morning and use 50 units every evening 04/19/18  Yes Mosie Lukes, MD  levothyroxine (SYNTHROID, LEVOTHROID) 25 MCG tablet Take 1 tablet (25 mcg total) by mouth daily before breakfast. 11/20/17  Yes Mosie Lukes, MD  lisinopril (PRINIVIL,ZESTRIL) 20 MG tablet Take 1 tablet (20 mg total) by mouth 2 (two) times daily. 11/10/16  Yes Mosie Lukes, MD  meloxicam (MOBIC) 7.5 MG tablet TAKE 1 TABLET BY MOUTH EVERY DAY Patient taking differently: Take 7.5 mg by mouth daily.  08/05/17  Yes Mosie Lukes, MD  metFORMIN (GLUCOPHAGE) 1000 MG tablet TAKE 1 TABLET TWICE DAILY AND 1/2 TABLET AT NOON Patient taking differently: Take 500-1,000 mg by mouth See admin instructions. Take 1 tablet every morning then take 1/2 tablet at lunch and at bedtime 04/01/16  Yes Mosie Lukes, MD  simvastatin (ZOCOR) 10 MG tablet Take 1 tablet (10 mg total) by mouth daily. 10/14/16  Yes Mosie Lukes, MD  tizanidine (ZANAFLEX) 2 MG capsule Take 1 capsule (2 mg total) by mouth 2 (two) times daily as needed for muscle spasms. 12/02/17  Yes Mosie Lukes, MD  doxycycline (VIBRAMYCIN) 100 MG capsule Take 1 capsule (100 mg total) by mouth 2 (two) times daily. Patient not taking: Reported on 06/23/2018 06/07/18   Brunetta Jeans, PA-C  HYDROcodone-acetaminophen [facility]) 10-325 MG  tablet Take 1-2 tablets by mouth every 6 (six) hours as needed (pain). Patient not taking: Reported on 06/23/2018 03/16/18   Mosie Lukes, MD  ONE TOUCH ULTRA TEST test strip USE ONCE DAILY TO CHECK BLOOD SUGAR. DX E11.9 02/08/18   [provider]  ONE TOUCH ULTRA TEST test strip USE ONCE DAILY TO CHECK BLOOD SUGAR. DX E11.9 03/29/18   Mosie Lukes, MD  silver sulfADIAZINE (SILVADENE) 1 % cream Apply 1 application topically daily. Patient not taking: Reported on 06/23/2018 08/13/17   Shelda Pal, DO    Physical Exam: Vitals:   06/24/18 0100 06/24/18 0130 06/24/18 0233 06/24/18 0235  BP: 129/79 (!) 166/87  (!) 148/79  Pulse: 77 (!) 107  82  Resp: 18 19    Temp:    98.7 F (  37.1 C)  TempSrc:    Oral  SpO2: 97% 96%  96%  Weight:   79.7 kg   Height:   5\' 2"  (1.575 m)       Constitutional: Moderately built and nourished. Vitals:   06/24/18 0100 06/24/18 0130 06/24/18 0233 06/24/18 0235  BP: 129/79 (!) 166/87  (!) 148/79  Pulse: 77 (!) 107  82  Resp: 18 19    Temp:    98.7 F (37.1 C)  TempSrc:    Oral  SpO2: 97% 96%  96%  Weight:   79.7 kg   Height:   5\' 2"  (1.575 m)    Eyes: Anicteric no pallor. ENMT: No discharge from the ears eyes nose or mouth. Neck: No mass felt.  No JVD appreciated. Respiratory: No rhonchi or crepitations. Cardiovascular: S1-S2 heard no murmurs appreciated. Abdomen: Mildly distended nontender bowel sounds present. Musculoskeletal: Bilateral lower extremity edema and upper extremity edema. Skin: Skin changes in the foot around the toes. Neurologic: Alert awake oriented to time place and person.  Moves all extremities. Psychiatric: Appears normal per normal affect.   Labs on Admission: I have personally reviewed following labs and imaging studies  CBC: Recent Labs  Lab 06/23/18 2116  WBC 11.1*  NEUTROABS 9.0*  HGB 9.7*  HCT 32.4*  MCV 86.9  PLT 259*   Basic Metabolic Panel: Recent Labs  Lab 06/23/18 2116  NA 140    K 3.3*  CL 103  CO2 24  GLUCOSE 137*  BUN 18  CREATININE 1.01*  CALCIUM 8.8*   GFR: Estimated Creatinine Clearance: 62.2 mL/min (A) (by C-G formula based on SCr of 1.01 mg/dL (H)). Liver Function Tests: Recent Labs  Lab 06/23/18 2116  AST 17  ALT 21  ALKPHOS 98  BILITOT 0.5  PROT 7.1  ALBUMIN 2.6*   No results for input(s): LIPASE, AMYLASE in the last 168 hours. No results for input(s): AMMONIA in the last 168 hours. Coagulation Profile: No results for input(s): INR, PROTIME in the last 168 hours. Cardiac Enzymes: No results for input(s): CKTOTAL, CKMB, CKMBINDEX, TROPONINI in the last 168 hours. BNP (last 3 results) No results for input(s): PROBNP in the last 8760 hours. HbA1C: No results for input(s): HGBA1C in the last 72 hours. CBG: No results for input(s): GLUCAP in the last 168 hours. Lipid Profile: No results for input(s): CHOL, HDL, LDLCALC, TRIG, CHOLHDL, LDLDIRECT in the last 72 hours. Thyroid Function Tests: No results for input(s): TSH, T4TOTAL, FREET4, T3FREE, THYROIDAB in the last 72 hours. Anemia Panel: No results for input(s): VITAMINB12, FOLATE, FERRITIN, TIBC, IRON, RETICCTPCT in the last 72 hours. Urine analysis:    Component Value Date/Time   COLORURINE YELLOW 06/23/2018 2111   APPEARANCEUR HAZY (A) 06/23/2018 2111   LABSPEC 1.019 06/23/2018 2111   PHURINE 6.0 06/23/2018 2111   GLUCOSEU 150 (A) 06/23/2018 2111   GLUCOSEU NEGATIVE 05/21/2015 0831   HGBUR MODERATE (A) 06/23/2018 2111   BILIRUBINUR NEGATIVE 06/23/2018 2111   KETONESUR NEGATIVE 06/23/2018 2111   PROTEINUR >=300 (A) 06/23/2018 2111   UROBILINOGEN 0.2 05/21/2015 0831   NITRITE NEGATIVE 06/23/2018 2111   LEUKOCYTESUR NEGATIVE 06/23/2018 2111   Sepsis Labs: @LABRCNTIP (procalcitonin:4,lacticidven:4) )No results found for this or any previous visit (from the past 240 hour(s)).   Radiological Exams on Admission: Dg Chest 2 View  Result Date: 06/23/2018 CLINICAL DATA:   Shortness of breath with dry cough for 3 days. No fever. EXAM: CHEST - 2 VIEW COMPARISON:  03/09/2018 FINDINGS: Borderline heart size that  is stable. There is diffuse interstitial opacity with [employer]. Possible trace effusions. IMPRESSION: CHF pattern. Electronically Signed   By: Monte Fantasia M.D.   On: 06/23/2018 21:33    EKG: Independently reviewed.  Sinus tachycardia with LVH changes.  Assessment/Plan Principal Problem:   Chest pain Active Problems:   Essential hypertension, benign   Anemia of chronic disease   Type 2 diabetes mellitus with vascular disease (HCC)   Acute on chronic diastolic CHF (congestive heart failure) ([place])    1. Chest pain most exertional -suspect patient symptoms are from fluid overload.  However since patient had mild fever the ER physician was concerned for possible pericarditis but that is no EKG findings patient chest pain is not very suggestive.  Will check 2D echo and cycle cardiac markers.  Diurese.  Patient is on aspirin.  Cardiology consulted.  Check sed rate.  Patient on aspirin and statins. 2. Acute on chronic diastolic CHF last EF measured in 2017 was 60 to 65%.  I have placed patient on Lasix 40 mg IV every 12.  Closely follow intake output metabolic panel and daily weights. 3. Fever with upper respiratory tract symptoms.  Flu panel was negative.  On exam patient is not wheezing.  Given the chest pain will check sed rate. 4. Anemia of chronic disease has mildly worsened not sure if is due to dilution from fluid.  Follow CBC check anemia panel. 5. Diabetes mellitus type 2 -medication reconciliation states that patient take 100 units of Lantus in the morning and 50 at bedtime.  Patient states he takes 15 the morning and if required additional 60 afternoon.  For now I have ordered only 50 in the morning and closely follow CBGs with sliding scale coverage. 6. History of stroke on aspirin and statins. 7. Lower extremity foot ulceration for which I have  ordered ABI and get wound team consult. 8. Depression and grief.   DVT prophylaxis: SCDs until we make sure there is no definite evidence of pericarditis. Code Status: Full code. Family Communication: Discussed with patient. Disposition Plan: Home. Consults called: Cardiology. Admission status: Observation.     Rise Patience MD [facility] Pager [phone].  If 7PM-7AM, please contact night-coverage [email or web address] Password [email or web address]  06/24/2018, 4:30 AM

## 2018-06-24 NOTE — Plan of Care (Signed)
  Problem: Clinical Measurements: Goal: Respiratory complications will improve Outcome: Progressing   Problem: Education: Goal: Knowledge of General Education information will improve Description Including pain rating scale, medication(s)/side effects and non-pharmacologic comfort measures Outcome: Completed/Met   

## 2018-06-24 NOTE — Consult Note (Signed)
[facility] Nurse wound consult note Reason for Consult:Right second toe.  Injury over a week ago, stubbed her toe, she reports.  Wound type:Trauma Pressure Injury POA: NA Measurement: 1 cm x 0.5 cm with split in the middle Wound bed: dry scabbed Drainage (amount, consistency, odor) none Periwound:intact  Decreased sensation to bilateral lower extremities Dressing procedure/placement/frequency: Cleanse wound to right toe with NS.  Apply mupirocin ointment to wound bed.  Cover with dry dressing.  Will not follow at this time.  Please re-consult if needed.  Domenic Moras MSN, RN, FNP-BC [facility]ON Wound, Ostomy, Continence Nurse Pager [phone]

## 2018-06-24 NOTE — Progress Notes (Signed)
VASCULAR LAB PRELIMINARY  ARTERIAL  ABI completed:    RIGHT    LEFT    PRESSURE WAVEFORM  PRESSURE WAVEFORM  BRACHIAL 149 triphasic BRACHIAL 145 triphasic  DP 159 triphasic DP 159 triphasic         PT 163 triphasic PT 144 triphasic                  RIGHT LEFT  ABI 1.09 1.07   Bilateral ABI's are within normal range.  [name] 06/24/2018, 8:56 AM

## 2018-06-24 NOTE — H&P (View-Only) (Signed)
Cardiology Consult    [name] MRN: [id number], DOB/AGE: [birth year]   Admit date: 06/23/2018 Date of Consult: 06/24/2018  Primary Physician: Mosie Lukes, MD Primary Cardiologist: Previously seen by Dr. Johnsie Cancel in 2016.  Requesting Provider: Dr. Bonner Puna.  Patient Profile    [name] is a 52 y.o. female with a history of chronic diastolic heart failure on Echo in 2017, hypertension, hyperlipidemia, type 2 diabetes, CVA in 2014 and 2017, and peripheral vascular disease, who is being seen today for the evaluation of chest pain at the request of Dr. Bonner Puna.  History of Present Illness    [name]'s husband of over 50 years died last month and since then patient states she has not been eating a very healthy diet. Patient states she has been eating a lot of fast-foods because she has no desire to cook anymore. She states she is stressed over just burying her husband and does not think she has been able to fully grieve yet.    Patient presented to the [facility] ED yesterday (06/23/2018) complaining of exertional chest pain and shortness of breath. She first noticed some shortness of breath about 4 days ago while she was walking to her car after work, which she has never had before.  Patient reports worsening shortness of breath over the last few day mainly with exertion. She denies any difficulty breathing at night. No orthopnea or PND. She has a hisotry of peripheral vascular disease but has not noticed any lower extremity swelling. She did have one brief episode of chest pain 3-4 days ago. She describes the pain as an ache and states it lasted for about 5 minutes before resolving on its own. She notes some associated shortness of breath with the pain but denies any diaphoresis, nausea, palpitations, lightheadedness, or dizziness. Patient reports nothing seemed to make the chest pain better or worse. Patient denies any recent illness. However per ED note, patient was  complaining of cough and fever over the past week. In the ED, patient reported the chest pain was worse with coughing. Patient has never had any exertional chest pain or shortness of breath before.   Upon arrival to the ED, patient febrile with temperature of 100.7 and mildly tachycardic with BP of 191/112. EKG showed mild sinus tachycardia with LVH and ST changes (downsloping) in the inferior leads. I-stat troponin elevated at 0.40. Chest x-ray showed diffuse interstitial opacity with Awanda Mink lines and possible trace effusions indicative of CHF. BNP elevated at 335.2. WBC 11.1, Hgb 9.7, Plts 570. Na 140, K 3.3, Glucose 137, SCr 1.01. Patient admitted for further management of chest pain and CHF.   Currently, patient reports minimal chest pain and states it just feel like a very mild dull ache. She notes her shortness of breath has improved since the breathing treatments.  Patient smoked for about 1 year when she was 52 year old. Patient denies any other tobacco, alcohol, or drug use.   Past Medical History   Past Medical History:  Diagnosis Date  . Acute on chronic diastolic CHF (congestive heart failure) ([place]) 06/24/2018  . Anxiety   . COMMON MIGRAINE 10/07/2010  . Decreased visual acuity 11/10/2016  . Depression 12/18/2012  . Diabetes mellitus type II, uncontrolled ([place]) 10/07/2010   Qualifier: Diagnosis of  By: Charlett Blake MD, [name]    . Diabetic foot infection ([place]) 08/26/2016  . Disturbance of skin sensation 10/07/2010  . Heart murmur   . History of kidney stones    "  years ago"  . Hyperlipidemia 12/06/2010  . Hypertension   . Lipoma of abdominal wall 10/05/2016  . Overweight(278.02) 12/06/2010  . PERIPHERAL NEUROPATHY, FEET 10/07/2010  . PVD (peripheral vascular disease) ([place]) 01/21/2012  . RESTLESS LEG SYNDROME 10/25/2010  . Stroke [facility]) 2014, 2017   most recently in 2/17 - intracerebral hemorrhage    Past Surgical History:  Procedure Laterality Date  . AMPUTATION TOE Left 09/09/2016    Procedure: AMPUTATION OF LEFT GREAT TOE;  Surgeon: Milly Jakob, MD;  Location: [facility];  Service: Orthopedics;  Laterality: Left;  . WISDOM TOOTH EXTRACTION       Allergies  Allergies  Allergen Reactions  . Lyrica [Pregabalin] Other (See Comments)    MADE PATIENT VERY EMOTIONAL AND WOULD CRY EASILY  . Morphine And Related Nausea And Vomiting  . Tramadol Nausea And Vomiting    Pt cant tolerate this pain med.     Inpatient Medications    . aspirin EC  81 mg Oral Daily  . escitalopram  10 mg Oral Daily  . furosemide  40 mg Intravenous Q12H  . gabapentin  300 mg Oral TID  . insulin aspart  0-9 Units Subcutaneous TID WC  . insulin glargine  50 Units Subcutaneous Daily  . levothyroxine  25 mcg Oral QAC breakfast  . lisinopril  20 mg Oral BID  . potassium chloride  20 mEq Oral Daily  . simvastatin  10 mg Oral Daily    Family History    Family History  Problem Relation Age of Onset  . Arthritis Mother   . Stroke Brother        previous smoker  . Alcohol abuse Brother        in remission  . Leukemia Brother   . Diabetes Paternal Grandmother   . Healthy Son    She indicated that her mother is alive. She indicated that her father is alive. She indicated that both of her brothers are alive. She indicated that her maternal grandmother is deceased. She indicated that her maternal grandfather is deceased. She indicated that her paternal grandmother is deceased. She indicated that her paternal grandfather is deceased. She indicated that only one of her two sons is alive.   Social History    Social History   Socioeconomic History  . Marital status: Single    Spouse name: Not on file  . Number of children: Not on file  . Years of education: Not on file  . Highest education level: Not on file  Occupational History  . Occupation: Glass blower/designer  Social Needs  . Financial resource strain: Not on file  . Food insecurity:    Worry: Not on file     Inability: Not on file  . Transportation needs:    Medical: Not on file    Non-medical: Not on file  Tobacco Use  . Smoking status: Former Smoker    Packs/day: 0.50    Last attempt to quit: 09/08/2005    Years since quitting: 12.8  . Smokeless tobacco: Never Used  Substance and Sexual Activity  . Alcohol use: No    Alcohol/week: 0.0 standard drinks  . Drug use: No  . Sexual activity: Yes    Partners: Male    Birth control/protection: None  Lifestyle  . Physical activity:    Days per week: Not on file    Minutes per session: Not on file  . Stress: Not on file  Relationships  . Social connections:  Talks on phone: Not on file    Gets together: Not on file    Attends religious service: Not on file    Active member of club or organization: Not on file    Attends meetings of clubs or organizations: Not on file    Relationship status: Not on file  . Intimate partner violence:    Fear of current or ex partner: Not on file    Emotionally abused: Not on file    Physically abused: Not on file    Forced sexual activity: Not on file  Other Topics Concern  . Not on file  Social History Narrative   Lives with husband in a two story home.  Has one child.     Works as a Glass blower/designer.     Education: high school.     Review of Systems    Review of Systems  Constitutional: Positive for fever. Negative for chills and diaphoresis.  HENT: Negative for congestion.   Respiratory: Positive for shortness of breath. Negative for cough and hemoptysis.   Cardiovascular: Positive for chest pain. Negative for palpitations, orthopnea, leg swelling and PND.  Gastrointestinal: Negative for abdominal pain, blood in stool, nausea and vomiting.  Genitourinary: Negative for hematuria.  Musculoskeletal: Negative for myalgias.  Neurological: Positive for weakness. Negative for dizziness, tingling and headaches.  Endo/Heme/Allergies: Does not bruise/bleed easily.  Psychiatric/Behavioral: Positive  for depression (and grief). Negative for substance abuse.    Physical Exam    Blood pressure (!) 148/79, pulse 82, temperature 98.7 F (37.1 C), temperature source Oral, resp. rate 19, height 5\' 2"  (1.575 m), weight 79.7 kg, last menstrual period 02/12/2011, SpO2 96 %.  General: 52 y.o. Caucasian female resting comfortably in no acute distress. Pleasant and cooperative. HEENT: Normal  Neck: Supple. No bruits or JVD appreciated. Lungs: No increased work of breathing. Mild crackles noted in bilateral bases. No wheezes or rhonchi. Heart: RRR. Distinct S1 and S2. No murmurs, gallops, or rubs. Chest wall tenderness with palpation.  Abdomen: Soft, non-distended, and non-tender to palpation. Bowel sounds present in all 4 quadrants.   Extremities: Mild bilateral lower extremity edema. Distal pedal pulses and radial pulses 2+. Neuro: Alert and oriented x3. No focal deficits. Moves all extremities spontaneously. Psych: Normal affect.  Labs    Troponin [facility] Test) Recent Labs    06/23/18 2252  TROPIPOC 0.40*   No results for input(s): CKTOTAL, CKMB, TROPONINI in the last 72 hours. Lab Results  Component Value Date   WBC 11.1 (H) 06/23/2018   HGB 9.7 (L) 06/23/2018   HCT 32.4 (L) 06/23/2018   MCV 86.9 06/23/2018   PLT 570 (H) 06/23/2018    Recent Labs  Lab 06/23/18 2116  NA 140  K 3.3*  CL 103  CO2 24  BUN 18  CREATININE 1.01*  CALCIUM 8.8*  PROT 7.1  BILITOT 0.5  ALKPHOS 98  ALT 21  AST 17  GLUCOSE 137*   Lab Results  Component Value Date   CHOL 195 11/17/2017   HDL 45.90 11/17/2017   LDLCALC 130 (H) 11/17/2017   TRIG 98.0 11/17/2017   No results found for: [facility]   Radiology Studies    Dg Chest 2 View  Result Date: 06/23/2018 CLINICAL DATA:  Shortness of breath with dry cough for 3 days. No fever. EXAM: CHEST - 2 VIEW COMPARISON:  03/09/2018 FINDINGS: Borderline heart size that is stable. There is diffuse interstitial opacity with [employer]. Possible  trace effusions. IMPRESSION: CHF  pattern. Electronically Signed   By: Monte Fantasia M.D.   On: 06/23/2018 21:33    EKG     EKG: EKG was personally reviewed and demonstrates: sinus tachycardia, rate 104 bpm, with LVH and ST changes (downsloping) noted in inferior leads.  Telemetry: Telemetry was personally reviewed and demonstrates: sinus bradycardia/normal sinus rhythm, rate ranging from 50s to 80s bpm, with occasional PVCs.   Cardiac Imaging    Echocardiogram 04/01/2016: Study Conclusions: - Left ventricle: The cavity size was normal. There was mild   concentric hypertrophy. Systolic function was normal. The   estimated ejection fraction was in the range of 60% to 65%. Wall   motion was normal; there were no regional wall motion   abnormalities. Doppler parameters are consistent with abnormal   left ventricular relaxation (grade 1 diastolic dysfunction).   Doppler parameters are consistent with high ventricular filling   pressure. - Aortic valve: Transvalvular velocity was within the normal range.   There was no stenosis. There was no regurgitation. - Mitral valve: There was no regurgitation. - Right ventricle: The cavity size was normal. Wall thickness was   normal. Systolic function was normal. - Atrial septum: No defect or patent foramen ovale was identified   by color flow Doppler. - Tricuspid valve: There was no regurgitation.  Assessment & Plan    1. Chest Pain with Elevated Troponin  - Patient reports a brief episode of chest pain with associated shortness of breath 3-4 days ago that lasted for about 5 minutes.  - EKG showed sinus tachycardia with ST changes noted in inferior leads.  - I-stat troponin elevated at 0.40. Repeat troponin 0.64. Continue to trend troponins.  - Patient notes very mild chest pain that she describes as a dullness. Chest wall tender to palpation on exam.  - Patient febrile upon arrival to ED and complained of a cough over the last few days of that  time. EKG not suggestive of pericarditis (no diffuse ST elevations). - Elevated sed rate of 115.  - Will check Echo to look for wall motion abnormalities and pericarditis.  - Will repeat EKG.  - May be secondary to supply demand ischemia in the setting of acute diastolic heart failure and hypertension with BP of 191/112 upon arrival to ED. However, patient does have multiple cardiovascular risk factors (HTN, HLD, T2DM, PVD) as well as a history of CVA and troponin is continuing to trend up. May consider additional ischemic workup pending Echo.    2. Acute on Chronic Diastolic Heart Failure - Patient unaware of heart failure diagnosis. Patient on Lasix 20mg  at home but cannot remember why she was started on that.  - Echo from 2017 showed LVEF of 60-65% with grade 1 diastolic dysfunction. - Patient states breathing has improved. Mild crackles noted on exam. Patient has some lower extremity edema but she does have a history of peripheral vascular disease.  - Will check new Echo.  - Continue gentle diuresis with Lasix. Patient currently on IV Lasix. May consider transitioning to PO tomorrow.  - Continue to monitor renal function during diuresis.   3. Hypertension - Improving but still elevated.  - BP 191/112 in ED. Most recent BP 148/79 this morning.  - Continue Lisinopril 20mg  twice daily. - Continue Lasix as above.   4. Hyperlipidemia - LDL 130 in 11/2017. - Will recheck LDL. - Currently on Simvastatin 10mg  at home. Consider transitioning to high-intensity statin.   5. Type 2 Diabetes Mellitus  - Continue SSI per Internal  Medicine.   Signed, Darreld Mclean, PA-C 06/24/2018, 7:54 AM  For questions or updates, please contact   Please consult [email or web address] for contact info under Cardiology/STEMI.  -----------------------------------------   History and all data above reviewed.  Patient examined.  I agree with the findings as above.  [name] is currently chest pain free.    Constitutional: No acute distress Eyes: pupils equally round and reactive to light, sclera non-icteric, normal conjunctiva and lids ENMT: normal dentition, moist mucous membranes Cardiovascular: regular rhythm, normal rate, no murmurs. S1 and S2 normal. Radial pulses normal bilaterally. No jugular venous distention.  Respiratory: clear to auscultation bilaterally GI : normal bowel sounds, soft and nontender. No distention.   MSK: extremities warm, well perfused. No edema.  LYMPH: No lymphadenopathy noted of the head and neck NEURO: grossly nonfocal exam, moves all extremities. PSYCH: alert and oriented x 3, normal mood and affect.   All available labs, radiology testing, previous records reviewed. Agree with documented assessment and plan of my colleague as stated above with the following additions or changes:  Principal Problem:   Non-ST elevation (NSTEMI) myocardial infarction [facility]) Active Problems:   Essential hypertension, benign   Anemia of chronic disease   Chest pain   Type 2 diabetes mellitus with vascular disease (HCC)   Acute on chronic diastolic CHF (congestive heart failure) ([place])    Plan:  I have independently reviewed her echocardiogram prior to formal interpretation. She has an anterior and anteroseptal mid to apical wall motion abnormality with borderline LV function which are new since her echo in 2017. With elevated troponins and ECG with ST depressions and new wall motion abnormalities, she likely needs coronary angiography. We may be able to accomplish this today since she has only had graham crackers this morning.  Her hemoglobin is low at 8.3 but she denies bleeding complications.  I have reviewed the risks, indications, and alternatives to cardiac catheterization, possible angioplasty, and stenting with the patient. Risks include but are not limited to bleeding, infection, vascular injury, stroke, myocardial infection, arrhythmia, kidney injury,  radiation-related injury in the case of prolonged fluoroscopy use, emergency cardiac surgery, and death. The patient understands the risks of serious complication is 1-2 in 0093 with diagnostic cardiac cath and 1-2% or less with angioplasty/stenting.    Elouise Munroe, MD [facility] 2:31 PM  06/24/2018

## 2018-06-24 NOTE — Progress Notes (Signed)
ANTICOAGULATION CONSULT NOTE - Initial Consult  Pharmacy Consult for heparin Indication: chest pain/ACS  Allergies  Allergen Reactions  . Lyrica [Pregabalin] Other (See Comments)    MADE PATIENT VERY EMOTIONAL AND WOULD CRY EASILY  . Morphine And Related Nausea And Vomiting  . Tramadol Nausea And Vomiting    Pt cant tolerate this pain med.     Patient Measurements: Height: 5\' 2"  (157.5 cm) Weight: 175 lb 12.8 oz (79.7 kg) IBW/kg (Calculated) : 50.1 Heparin Dosing Weight: 68kg  Vital Signs: Temp: 98.7 F (37.1 C) (10/17 0235) Temp Source: Oral (10/17 0235) BP: 148/79 (10/17 0235) Pulse Rate: 82 (10/17 0235)  Labs: Recent Labs    06/23/18 2116 06/24/18 0454 06/24/18 0808 06/24/18 0928  HGB 9.7* 8.3*  --   --   HCT 32.4* 27.2*  --   --   PLT 570* 484*  --   --   CREATININE 1.01*  --   --  1.08*  TROPONINI  --   --  0.64* 0.60*    Estimated Creatinine Clearance: 58.2 mL/min (A) (by C-G formula based on SCr of 1.08 mg/dL (H)).   Medical History: Past Medical History:  Diagnosis Date  . Acute on chronic diastolic CHF (congestive heart failure) ([place]) 06/24/2018  . Anxiety   . COMMON MIGRAINE 10/07/2010  . Decreased visual acuity 11/10/2016  . Depression 12/18/2012  . Diabetes mellitus type II, uncontrolled ([place]) 10/07/2010   Qualifier: Diagnosis of  By: Charlett Blake MD, [name]    . Diabetic foot infection ([place]) 08/26/2016  . Disturbance of skin sensation 10/07/2010  . Heart murmur   . History of kidney stones    "years ago"  . Hyperlipidemia 12/06/2010  . Hypertension   . Lipoma of abdominal wall 10/05/2016  . Overweight(278.02) 12/06/2010  . PERIPHERAL NEUROPATHY, FEET 10/07/2010  . PVD (peripheral vascular disease) ([place]) 01/21/2012  . RESTLESS LEG SYNDROME 10/25/2010  . Stroke [facility]) 2014, 2017   most recently in 2/17 - intracerebral hemorrhage     Assessment: 34 yoF admitted with CP and SOB to start on IV heparin prior to cardiac cath for ACS r/o. Trops up, Hgb low  with hx of anemia on admission.  Goal of Therapy:  Heparin level 0.3-0.7 units/ml Monitor platelets by anticoagulation protocol: Yes   Plan:  -Heparin 4000 units x1 -Heparin 800 units/hr -Check 6-hr heparin level -Monitor heparin level, CBC, S/Sx bleeding daily  Arrie Senate, PharmD, BCPS Clinical Pharmacist [phone] Please check AMION for all [place] numbers 06/24/2018

## 2018-06-24 NOTE — Progress Notes (Signed)
2D Echocardiogram has been performed.  [name] 06/24/2018, 12:13 PM

## 2018-06-24 NOTE — ED Notes (Signed)
Pt alert no pain   No seizure for a while he reports that he has been taking his med

## 2018-06-24 NOTE — Consult Note (Addendum)
Cardiology Consult    [name] MRN: [id number], DOB/AGE: May 08, 1966   Admit date: 06/23/2018 Date of Consult: 06/24/2018  Primary Physician: Mosie Lukes, MD Primary Cardiologist: Previously seen by Dr. Johnsie Cancel in 2016.  Requesting Provider: Dr. Bonner Puna.  Patient Profile    [name] is a 52 y.o. female with a history of chronic diastolic heart failure on Echo in 2017, hypertension, hyperlipidemia, type 2 diabetes, CVA in 2014 and 2017, and peripheral vascular disease, who is being seen today for the evaluation of chest pain at the request of Dr. Bonner Puna.  History of Present Illness    Ms. [name]'s husband of over 69 years died last month and since then patient states she has not been eating a very healthy diet. Patient states she has been eating a lot of fast-foods because she has no desire to cook anymore. She states she is stressed over just burying her husband and does not think she has been able to fully grieve yet.    Patient presented to the [facility] ED yesterday (06/23/2018) complaining of exertional chest pain and shortness of breath. She first noticed some shortness of breath about 4 days ago while she was walking to her car after work, which she has never had before.  Patient reports worsening shortness of breath over the last few day mainly with exertion. She denies any difficulty breathing at night. No orthopnea or PND. She has a hisotry of peripheral vascular disease but has not noticed any lower extremity swelling. She did have one brief episode of chest pain 3-4 days ago. She describes the pain as an ache and states it lasted for about 5 minutes before resolving on its own. She notes some associated shortness of breath with the pain but denies any diaphoresis, nausea, palpitations, lightheadedness, or dizziness. Patient reports nothing seemed to make the chest pain better or worse. Patient denies any recent illness. However per ED note, patient was  complaining of cough and fever over the past week. In the ED, patient reported the chest pain was worse with coughing. Patient has never had any exertional chest pain or shortness of breath before.   Upon arrival to the ED, patient febrile with temperature of 100.7 and mildly tachycardic with BP of 191/112. EKG showed mild sinus tachycardia with LVH and ST changes (downsloping) in the inferior leads. I-stat troponin elevated at 0.40. Chest x-ray showed diffuse interstitial opacity with Awanda Mink lines and possible trace effusions indicative of CHF. BNP elevated at 335.2. WBC 11.1, Hgb 9.7, Plts 570. Na 140, K 3.3, Glucose 137, SCr 1.01. Patient admitted for further management of chest pain and CHF.   Currently, patient reports minimal chest pain and states it just feel like a very mild dull ache. She notes her shortness of breath has improved since the breathing treatments.  Patient smoked for about 1 year when she was 52 year old. Patient denies any other tobacco, alcohol, or drug use.   Past Medical History   Past Medical History:  Diagnosis Date  . Acute on chronic diastolic CHF (congestive heart failure) ([place]) 06/24/2018  . Anxiety   . COMMON MIGRAINE 10/07/2010  . Decreased visual acuity 11/10/2016  . Depression 12/18/2012  . Diabetes mellitus type II, uncontrolled ([place]) 10/07/2010   Qualifier: Diagnosis of  By: Charlett Blake MD, [name]    . Diabetic foot infection ([place]) 08/26/2016  . Disturbance of skin sensation 10/07/2010  . Heart murmur   . History of kidney stones    "  years ago"  . Hyperlipidemia 12/06/2010  . Hypertension   . Lipoma of abdominal wall 10/05/2016  . Overweight(278.02) 12/06/2010  . PERIPHERAL NEUROPATHY, FEET 10/07/2010  . PVD (peripheral vascular disease) ([place]) 01/21/2012  . RESTLESS LEG SYNDROME 10/25/2010  . Stroke [facility]) 2014, 2017   most recently in 2/17 - intracerebral hemorrhage    Past Surgical History:  Procedure Laterality Date  . AMPUTATION TOE Left 09/09/2016    Procedure: AMPUTATION OF LEFT GREAT TOE;  Surgeon: Milly Jakob, MD;  Location: [facility];  Service: Orthopedics;  Laterality: Left;  . WISDOM TOOTH EXTRACTION       Allergies  Allergies  Allergen Reactions  . Lyrica [Pregabalin] Other (See Comments)    MADE PATIENT VERY EMOTIONAL AND WOULD CRY EASILY  . Morphine And Related Nausea And Vomiting  . Tramadol Nausea And Vomiting    Pt cant tolerate this pain med.     Inpatient Medications    . aspirin EC  81 mg Oral Daily  . escitalopram  10 mg Oral Daily  . furosemide  40 mg Intravenous Q12H  . gabapentin  300 mg Oral TID  . insulin aspart  0-9 Units Subcutaneous TID WC  . insulin glargine  50 Units Subcutaneous Daily  . levothyroxine  25 mcg Oral QAC breakfast  . lisinopril  20 mg Oral BID  . potassium chloride  20 mEq Oral Daily  . simvastatin  10 mg Oral Daily    Family History    Family History  Problem Relation Age of Onset  . Arthritis Mother   . Stroke Brother        previous smoker  . Alcohol abuse Brother        in remission  . Leukemia Brother   . Diabetes Paternal Grandmother   . Healthy Son    She indicated that her mother is alive. She indicated that her father is alive. She indicated that both of her brothers are alive. She indicated that her maternal grandmother is deceased. She indicated that her maternal grandfather is deceased. She indicated that her paternal grandmother is deceased. She indicated that her paternal grandfather is deceased. She indicated that only one of her two sons is alive.   Social History    Social History   Socioeconomic History  . Marital status: Single    Spouse name: Not on file  . Number of children: Not on file  . Years of education: Not on file  . Highest education level: Not on file  Occupational History  . Occupation: Glass blower/designer  Social Needs  . Financial resource strain: Not on file  . Food insecurity:    Worry: Not on file     Inability: Not on file  . Transportation needs:    Medical: Not on file    Non-medical: Not on file  Tobacco Use  . Smoking status: Former Smoker    Packs/day: 0.50    Last attempt to quit: 09/08/2005    Years since quitting: 12.8  . Smokeless tobacco: Never Used  Substance and Sexual Activity  . Alcohol use: No    Alcohol/week: 0.0 standard drinks  . Drug use: No  . Sexual activity: Yes    Partners: Male    Birth control/protection: None  Lifestyle  . Physical activity:    Days per week: Not on file    Minutes per session: Not on file  . Stress: Not on file  Relationships  . Social connections:  Talks on phone: Not on file    Gets together: Not on file    Attends religious service: Not on file    Active member of club or organization: Not on file    Attends meetings of clubs or organizations: Not on file    Relationship status: Not on file  . Intimate partner violence:    Fear of current or ex partner: Not on file    Emotionally abused: Not on file    Physically abused: Not on file    Forced sexual activity: Not on file  Other Topics Concern  . Not on file  Social History Narrative   Lives with husband in a two story home.  Has one child.     Works as a Glass blower/designer.     Education: high school.     Review of Systems    Review of Systems  Constitutional: Positive for fever. Negative for chills and diaphoresis.  HENT: Negative for congestion.   Respiratory: Positive for shortness of breath. Negative for cough and hemoptysis.   Cardiovascular: Positive for chest pain. Negative for palpitations, orthopnea, leg swelling and PND.  Gastrointestinal: Negative for abdominal pain, blood in stool, nausea and vomiting.  Genitourinary: Negative for hematuria.  Musculoskeletal: Negative for myalgias.  Neurological: Positive for weakness. Negative for dizziness, tingling and headaches.  Endo/Heme/Allergies: Does not bruise/bleed easily.  Psychiatric/Behavioral: Positive  for depression (and grief). Negative for substance abuse.    Physical Exam    Blood pressure (!) 148/79, pulse 82, temperature 98.7 F (37.1 C), temperature source Oral, resp. rate 19, height 5\' 2"  (1.575 m), weight 79.7 kg, last menstrual period 02/12/2011, SpO2 96 %.  General: 52 y.o. Caucasian female resting comfortably in no acute distress. Pleasant and cooperative. HEENT: Normal  Neck: Supple. No bruits or JVD appreciated. Lungs: No increased work of breathing. Mild crackles noted in bilateral bases. No wheezes or rhonchi. Heart: RRR. Distinct S1 and S2. No murmurs, gallops, or rubs. Chest wall tenderness with palpation.  Abdomen: Soft, non-distended, and non-tender to palpation. Bowel sounds present in all 4 quadrants.   Extremities: Mild bilateral lower extremity edema. Distal pedal pulses and radial pulses 2+. Neuro: Alert and oriented x3. No focal deficits. Moves all extremities spontaneously. Psych: Normal affect.  Labs    Troponin [facility] Test) Recent Labs    06/23/18 2252  TROPIPOC 0.40*   No results for input(s): CKTOTAL, CKMB, TROPONINI in the last 72 hours. Lab Results  Component Value Date   WBC 11.1 (H) 06/23/2018   HGB 9.7 (L) 06/23/2018   HCT 32.4 (L) 06/23/2018   MCV 86.9 06/23/2018   PLT 570 (H) 06/23/2018    Recent Labs  Lab 06/23/18 2116  NA 140  K 3.3*  CL 103  CO2 24  BUN 18  CREATININE 1.01*  CALCIUM 8.8*  PROT 7.1  BILITOT 0.5  ALKPHOS 98  ALT 21  AST 17  GLUCOSE 137*   Lab Results  Component Value Date   CHOL 195 11/17/2017   HDL 45.90 11/17/2017   LDLCALC 130 (H) 11/17/2017   TRIG 98.0 11/17/2017   No results found for: [facility]   Radiology Studies    Dg Chest 2 View  Result Date: 06/23/2018 CLINICAL DATA:  Shortness of breath with dry cough for 3 days. No fever. EXAM: CHEST - 2 VIEW COMPARISON:  03/09/2018 FINDINGS: Borderline heart size that is stable. There is diffuse interstitial opacity with [employer]. Possible  trace effusions. IMPRESSION: CHF  pattern. Electronically Signed   By: Monte Fantasia M.D.   On: 06/23/2018 21:33    EKG     EKG: EKG was personally reviewed and demonstrates: sinus tachycardia, rate 104 bpm, with LVH and ST changes (downsloping) noted in inferior leads.  Telemetry: Telemetry was personally reviewed and demonstrates: sinus bradycardia/normal sinus rhythm, rate ranging from 50s to 80s bpm, with occasional PVCs.   Cardiac Imaging    Echocardiogram 04/01/2016: Study Conclusions: - Left ventricle: The cavity size was normal. There was mild   concentric hypertrophy. Systolic function was normal. The   estimated ejection fraction was in the range of 60% to 65%. Wall   motion was normal; there were no regional wall motion   abnormalities. Doppler parameters are consistent with abnormal   left ventricular relaxation (grade 1 diastolic dysfunction).   Doppler parameters are consistent with high ventricular filling   pressure. - Aortic valve: Transvalvular velocity was within the normal range.   There was no stenosis. There was no regurgitation. - Mitral valve: There was no regurgitation. - Right ventricle: The cavity size was normal. Wall thickness was   normal. Systolic function was normal. - Atrial septum: No defect or patent foramen ovale was identified   by color flow Doppler. - Tricuspid valve: There was no regurgitation.  Assessment & Plan    1. Chest Pain with Elevated Troponin  - Patient reports a brief episode of chest pain with associated shortness of breath 3-4 days ago that lasted for about 5 minutes.  - EKG showed sinus tachycardia with ST changes noted in inferior leads.  - I-stat troponin elevated at 0.40. Repeat troponin 0.64. Continue to trend troponins.  - Patient notes very mild chest pain that she describes as a dullness. Chest wall tender to palpation on exam.  - Patient febrile upon arrival to ED and complained of a cough over the last few days of that  time. EKG not suggestive of pericarditis (no diffuse ST elevations). - Elevated sed rate of 115.  - Will check Echo to look for wall motion abnormalities and pericarditis.  - Will repeat EKG.  - May be secondary to supply demand ischemia in the setting of acute diastolic heart failure and hypertension with BP of 191/112 upon arrival to ED. However, patient does have multiple cardiovascular risk factors (HTN, HLD, T2DM, PVD) as well as a history of CVA and troponin is continuing to trend up. May consider additional ischemic workup pending Echo.    2. Acute on Chronic Diastolic Heart Failure - Patient unaware of heart failure diagnosis. Patient on Lasix 20mg  at home but cannot remember why she was started on that.  - Echo from 2017 showed LVEF of 60-65% with grade 1 diastolic dysfunction. - Patient states breathing has improved. Mild crackles noted on exam. Patient has some lower extremity edema but she does have a history of peripheral vascular disease.  - Will check new Echo.  - Continue gentle diuresis with Lasix. Patient currently on IV Lasix. May consider transitioning to PO tomorrow.  - Continue to monitor renal function during diuresis.   3. Hypertension - Improving but still elevated.  - BP 191/112 in ED. Most recent BP 148/79 this morning.  - Continue Lisinopril 20mg  twice daily. - Continue Lasix as above.   4. Hyperlipidemia - LDL 130 in 11/2017. - Will recheck LDL. - Currently on Simvastatin 10mg  at home. Consider transitioning to high-intensity statin.   5. Type 2 Diabetes Mellitus  - Continue SSI per Internal  Medicine.   Signed, Darreld Mclean, PA-C 06/24/2018, 7:54 AM  For questions or updates, please contact   Please consult [email or web address] for contact info under Cardiology/STEMI.  -----------------------------------------   History and all data above reviewed.  Patient examined.  I agree with the findings as above.  BRITNEE MCDEVITT is currently chest pain free.    Constitutional: No acute distress Eyes: pupils equally round and reactive to light, sclera non-icteric, normal conjunctiva and lids ENMT: normal dentition, moist mucous membranes Cardiovascular: regular rhythm, normal rate, no murmurs. S1 and S2 normal. Radial pulses normal bilaterally. No jugular venous distention.  Respiratory: clear to auscultation bilaterally GI : normal bowel sounds, soft and nontender. No distention.   MSK: extremities warm, well perfused. No edema.  LYMPH: No lymphadenopathy noted of the head and neck NEURO: grossly nonfocal exam, moves all extremities. PSYCH: alert and oriented x 3, normal mood and affect.   All available labs, radiology testing, previous records reviewed. Agree with documented assessment and plan of my colleague as stated above with the following additions or changes:  Principal Problem:   Non-ST elevation (NSTEMI) myocardial infarction [facility]) Active Problems:   Essential hypertension, benign   Anemia of chronic disease   Chest pain   Type 2 diabetes mellitus with vascular disease (HCC)   Acute on chronic diastolic CHF (congestive heart failure) ([place])    Plan:  I have independently reviewed her echocardiogram prior to formal interpretation. She has an anterior and anteroseptal mid to apical wall motion abnormality with borderline LV function which are new since her echo in 2017. With elevated troponins and ECG with ST depressions and new wall motion abnormalities, she likely needs coronary angiography. We may be able to accomplish this today since she has only had graham crackers this morning.  Her hemoglobin is low at 8.3 but she denies bleeding complications.  I have reviewed the risks, indications, and alternatives to cardiac catheterization, possible angioplasty, and stenting with the patient. Risks include but are not limited to bleeding, infection, vascular injury, stroke, myocardial infection, arrhythmia, kidney injury,  radiation-related injury in the case of prolonged fluoroscopy use, emergency cardiac surgery, and death. The patient understands the risks of serious complication is 1-2 in 6834 with diagnostic cardiac cath and 1-2% or less with angioplasty/stenting.    Elouise Munroe, MD [facility] 2:31 PM  06/24/2018

## 2018-06-24 NOTE — Consult Note (Signed)
Reason for Consult:Left main/ 3 vessel CAD Referring Physician: Dr. Wyvonnia Dusky ZANIYAH [name] is an 52 y.o. female.  HPI: 52 yo woman admitted with cc/o shortness of breath  [name] is a 52 yo woman with a past history of hypertension, hyperlipidemia, type II insulin dependent diabetes with vascular complications, peripheral neuropathy, depression, obesity, stroke x 2, and chronic pain. She presented to the ED with a 3 day history of chest pain and shortness of breath. She currently denies any pain, says she was just short of breath. She ruled in for a non STEMI with a troponin of 0.64. BNP elevated at 335. She was mildly febrile in ED with temp 100.7. She was hypertensive on arrival.  Pain free since admission. Breathing comfortably at rest.  Cath today showed left main and 3 vessel CAD. RCA disease is distal in PDA.  Past Medical History:  Diagnosis Date  . Acute on chronic diastolic CHF (congestive heart failure) ([place]) 06/24/2018  . Anxiety   . COMMON MIGRAINE 10/07/2010  . Decreased visual acuity 11/10/2016  . Depression 12/18/2012  . Diabetes mellitus type II, uncontrolled ([place]) 10/07/2010   Qualifier: Diagnosis of  By: Charlett Blake MD, [name]    . Diabetic foot infection ([place]) 08/26/2016  . Disturbance of skin sensation 10/07/2010  . Heart murmur   . History of kidney stones    "years ago"  . Hyperlipidemia 12/06/2010  . Hypertension   . Lipoma of abdominal wall 10/05/2016  . Overweight(278.02) 12/06/2010  . PERIPHERAL NEUROPATHY, FEET 10/07/2010  . PVD (peripheral vascular disease) ([place]) 01/21/2012  . RESTLESS LEG SYNDROME 10/25/2010  . Stroke [facility]) 2014, 2017   most recently in 2/17 - intracerebral hemorrhage    Past Surgical History:  Procedure Laterality Date  . AMPUTATION TOE Left 09/09/2016   Procedure: AMPUTATION OF LEFT GREAT TOE;  Surgeon: Milly Jakob, MD;  Location: [facility];  Service: Orthopedics;  Laterality: Left;  . WISDOM TOOTH EXTRACTION       Family History  Problem Relation Age of Onset  . Arthritis Mother   . Stroke Brother        previous smoker  . Alcohol abuse Brother        in remission  . Leukemia Brother   . Diabetes Paternal Grandmother   . Healthy Son     Social History:  reports that she quit smoking about 12 years ago. She smoked 0.50 packs per day. She has never used smokeless tobacco. She reports that she does not drink alcohol or use drugs.  Allergies:  Allergies  Allergen Reactions  . Lyrica [Pregabalin] Other (See Comments)    MADE PATIENT VERY EMOTIONAL AND WOULD CRY EASILY  . Morphine And Related Nausea And Vomiting  . Tramadol Nausea And Vomiting    Pt cant tolerate this pain med.     Medications:  Prior to Admission:  Medications Prior to Admission  Medication Sig Dispense Refill Last Dose  . aspirin EC 81 MG tablet Take 81 mg by mouth daily.   06/23/2018 at Unknown time  . azelastine (ASTELIN) 0.1 % nasal spray Place 2 sprays into both nostrils 2 (two) times daily. Use in each nostril as directed (Patient taking differently: Place 2 sprays into both nostrils 2 (two) times daily as needed for allergies. ) 30 mL 3 Past Month at Unknown time  . escitalopram (LEXAPRO) 10 MG tablet Take 1 tablet (10 mg total) by mouth daily. 30 tablet 2 06/23/2018 at Unknown time  .  fluticasone (FLONASE) 50 MCG/ACT nasal spray Place 2 sprays into both nostrils daily. (Patient taking differently: Place 2 sprays into both nostrils daily as needed for allergies. ) 16 g 1 Past Month at Unknown time  . furosemide (LASIX) 20 MG tablet TAKE 2 TABS DAILY X5DAYS, TAKE 1 TAB DAILY X5DAYS. THEN TAKE 1 TAB DAILY AS NEEDED FOR EDEMA (Patient taking differently: Take 20 mg by mouth daily as needed for fluid. ) 35 tablet 1 Past Month at Unknown time  . gabapentin (NEURONTIN) 100 MG capsule Take 3 capsules (300 mg total) by mouth 3 (three) times daily. 90 capsule 3 06/23/2018 at Unknown time  . LANTUS SOLOSTAR 100 UNIT/ML Solostar  Pen 100 units sq in am and 50 units sq in pm (Patient taking differently: Inject 50-100 Units into the skin See admin instructions. Use 100 units every morning and use 50 units every evening) 45 mL 4 06/23/2018 at Unknown time  . levothyroxine (SYNTHROID, LEVOTHROID) 25 MCG tablet Take 1 tablet (25 mcg total) by mouth daily before breakfast. 30 tablet 1 06/23/2018 at Unknown time  . lisinopril (PRINIVIL,ZESTRIL) 20 MG tablet Take 1 tablet (20 mg total) by mouth 2 (two) times daily. 90 tablet 2 06/23/2018 at Unknown time  . meloxicam (MOBIC) 7.5 MG tablet TAKE 1 TABLET BY MOUTH EVERY DAY (Patient taking differently: Take 7.5 mg by mouth daily. ) 20 tablet 0 06/23/2018 at Unknown time  . metFORMIN (GLUCOPHAGE) 1000 MG tablet TAKE 1 TABLET TWICE DAILY AND 1/2 TABLET AT NOON (Patient taking differently: Take 500-1,000 mg by mouth See admin instructions. Take 1 tablet every morning then take 1/2 tablet at lunch and at bedtime) 75 tablet 1 06/23/2018 at Unknown time  . simvastatin (ZOCOR) 10 MG tablet Take 1 tablet (10 mg total) by mouth daily. 30 tablet 3 06/23/2018 at Unknown time  . tizanidine (ZANAFLEX) 2 MG capsule Take 1 capsule (2 mg total) by mouth 2 (two) times daily as needed for muscle spasms. 30 capsule 0 Past Month at Unknown time  . doxycycline (VIBRAMYCIN) 100 MG capsule Take 1 capsule (100 mg total) by mouth 2 (two) times daily. (Patient not taking: Reported on 06/23/2018) 14 capsule 0 Completed Course at Unknown time  . HYDROcodone-acetaminophen (NORCO) 10-325 MG tablet Take 1-2 tablets by mouth every 6 (six) hours as needed (pain). (Patient not taking: Reported on 06/23/2018) 20 tablet 0 Completed Course at Unknown time  . ONE TOUCH ULTRA TEST test strip USE ONCE DAILY TO CHECK BLOOD SUGAR. DX E11.9  2 Taking  . ONE TOUCH ULTRA TEST test strip USE ONCE DAILY TO CHECK BLOOD SUGAR. DX E11.9 100 each 1 Taking  . silver sulfADIAZINE (SILVADENE) 1 % cream Apply 1 application topically daily.  (Patient not taking: Reported on 06/23/2018) 50 g 0 Completed Course at Unknown time    Results for orders placed or performed during the hospital encounter of 06/23/18 (from the past 48 hour(s))  Blood Culture (routine x 2)     Status: None (Preliminary result)   Collection Time: 06/23/18  8:45 PM  Result Value Ref Range   Specimen Description BLOOD RIGHT ARM    Special Requests      BOTTLES DRAWN AEROBIC AND ANAEROBIC Blood Culture results may not be optimal due to an excessive volume of blood received in culture bottles   Culture      NO GROWTH < 24 HOURS Performed at [facility] [address], [place], [place] [address]    Report Status  PENDING   Blood Culture (routine x 2)     Status: None (Preliminary result)   Collection Time: 06/23/18  9:08 PM  Result Value Ref Range   Specimen Description BLOOD RIGHT ARM    Special Requests      BOTTLES DRAWN AEROBIC ONLY Blood Culture adequate volume   Culture      NO GROWTH < 24 HOURS Performed at [facility], [place] [address] [place], [place] [address]    Report Status PENDING   Urinalysis, Routine w reflex microscopic     Status: Abnormal   Collection Time: 06/23/18  9:11 PM  Result Value Ref Range   Color, Urine YELLOW YELLOW   APPearance HAZY (A) CLEAR   Specific Gravity, Urine 1.019 1.005 - 1.030   pH 6.0 5.0 - 8.0   Glucose, UA 150 (A) NEGATIVE mg/dL   Hgb urine dipstick MODERATE (A) NEGATIVE   Bilirubin Urine NEGATIVE NEGATIVE   Ketones, ur NEGATIVE NEGATIVE mg/dL   Protein, ur >=300 (A) NEGATIVE mg/dL   Nitrite NEGATIVE NEGATIVE   Leukocytes, UA NEGATIVE NEGATIVE   RBC / HPF 21-50 0 - 5 RBC/hpf   WBC, UA 6-10 0 - 5 WBC/hpf   Bacteria, UA RARE (A) NONE SEEN   Squamous Epithelial / LPF 0-5 0 - 5   Mucus PRESENT     Comment: Performed at [facility], [place] [address], [place], [place] [address]  Comprehensive metabolic panel     Status: Abnormal   Collection Time: 06/23/18  9:16 PM  Result Value Ref  Range   Sodium 140 135 - 145 mmol/L   Potassium 3.3 (L) 3.5 - 5.1 mmol/L   Chloride 103 98 - 111 mmol/L   CO2 24 22 - 32 mmol/L   Glucose, Bld 137 (H) 70 - 99 mg/dL   BUN 18 6 - 20 mg/dL   Creatinine, Ser 1.01 (H) 0.44 - 1.00 mg/dL   Calcium 8.8 (L) 8.9 - 10.3 mg/dL   Total Protein 7.1 6.5 - 8.1 g/dL   Albumin 2.6 (L) 3.5 - 5.0 g/dL   AST 17 15 - 41 U/L   ALT 21 0 - 44 U/L   Alkaline Phosphatase 98 38 - 126 U/L   Total Bilirubin 0.5 0.3 - 1.2 mg/dL   GFR calc non Af Amer >60 >60 mL/min   GFR calc Af Amer >60 >60 mL/min    Comment: (NOTE) The eGFR has been calculated using the CKD EPI equation. This calculation has not been validated in all clinical situations. eGFR's persistently <60 mL/min signify possible Chronic Kidney Disease.    Anion gap 13 5 - 15    Comment: Performed at [facility] [address], [place], [place] [address]  CBC with Differential     Status: Abnormal   Collection Time: 06/23/18  9:16 PM  Result Value Ref Range   WBC 11.1 (H) 4.0 - 10.5 K/uL   RBC 3.73 (L) 3.87 - 5.11 MIL/uL   Hemoglobin 9.7 (L) 12.0 - 15.0 g/dL   HCT 32.4 (L) 36.0 - 46.0 %   MCV 86.9 80.0 - 100.0 fL   MCH 26.0 26.0 - 34.0 pg   MCHC 29.9 (L) 30.0 - 36.0 g/dL   RDW 13.3 11.5 - 15.5 %   Platelets 570 (H) 150 - 400 K/uL   nRBC 0.0 0.0 - 0.2 %   Neutrophils Relative % 82 %   Neutro Abs 9.0 (H) 1.7 - 7.7 K/uL  Lymphocytes Relative 8 %   Lymphs Abs 0.9 0.7 - 4.0 K/uL   Monocytes Relative 7 %   Monocytes Absolute 0.8 0.1 - 1.0 K/uL   Eosinophils Relative 2 %   Eosinophils Absolute 0.2 0.0 - 0.5 K/uL   Basophils Relative 1 %   Basophils Absolute 0.1 0.0 - 0.1 K/uL   Immature Granulocytes 0 %   Abs Immature Granulocytes 0.04 0.00 - 0.07 K/uL    Comment: Performed at [facility], [place] [address] [place], [place] [address]  Brain natriuretic peptide     Status: Abnormal   Collection Time: 06/23/18  9:16 PM  Result Value Ref Range   B Natriuretic Peptide 335.2 (H) 0.0  - 100.0 pg/mL    Comment: Performed at [facility] [address] [place], [place] [address]  I-Stat CG4 Lactic Acid, ED  (not at  [facility])     Status: None   Collection Time: 06/23/18  9:22 PM  Result Value Ref Range   Lactic Acid, Venous 1.32 0.5 - 1.9 mmol/L  I-stat troponin, ED     Status: Abnormal   Collection Time: 06/23/18 10:52 PM  Result Value Ref Range   Troponin i, poc 0.40 (HH) 0.00 - 0.08 ng/mL   Comment NOTIFIED PHYSICIAN    Comment 3            Comment: Due to the release kinetics of cTnI, a negative result within the first hours of the onset of symptoms does not rule out myocardial infarction with certainty. If myocardial infarction is still suspected, repeat the test at appropriate intervals.   I-Stat CG4 Lactic Acid, ED  (not at  [facility])     Status: None   Collection Time: 06/23/18 10:54 PM  Result Value Ref Range   Lactic Acid, Venous 1.04 0.5 - 1.9 mmol/L  Influenza panel by PCR (type A & B)     Status: None   Collection Time: 06/23/18 11:50 PM  Result Value Ref Range   Influenza A By PCR NEGATIVE NEGATIVE   Influenza B By PCR NEGATIVE NEGATIVE    Comment: (NOTE) The Xpert Xpress Flu assay is intended as an aid in the diagnosis of  influenza and should not be used as a sole basis for treatment.  This  assay is FDA approved for nasopharyngeal swab specimens only. Nasal  washings and aspirates are unacceptable for Xpert Xpress Flu testing. Performed at [facility], [place] [address], [place],  [address]   HIV antibody (Routine Testing)     Status: None   Collection Time: 06/24/18  4:54 AM  Result Value Ref Range   HIV Screen 4th Generation wRfx Non Reactive Non Reactive    Comment: (NOTE) Performed At: [facility] [place], [place] [id number] Rush Farmer MD [id number]   CBC     Status: Abnormal   Collection Time: 06/24/18  4:54 AM  Result Value Ref Range   WBC 7.2 4.0 - 10.5 K/uL   RBC 3.13 (L) 3.87 - 5.11  MIL/uL   Hemoglobin 8.3 (L) 12.0 - 15.0 g/dL   HCT 27.2 (L) 36.0 - 46.0 %   MCV 86.9 80.0 - 100.0 fL   MCH 26.5 26.0 - 34.0 pg   MCHC 30.5 30.0 - 36.0 g/dL   RDW 13.5 11.5 - 15.5 %   Platelets 484 (H) 150 - 400 K/uL   nRBC 0.0 0.0 - 0.2 %    Comment: Performed at [facility], [place] [address] [place], [place] [address]  TSH     Status: None   Collection Time: 06/24/18  4:54 AM  Result Value Ref Range   TSH 2.674 0.350 - 4.500 uIU/mL    Comment: Performed by a 3rd Generation assay with a functional sensitivity of <=0.01 uIU/mL. Performed at [facility], [place] [address], , [place] [address]   Sedimentation rate     Status: Abnormal   Collection Time: 06/24/18  4:54 AM  Result Value Ref Range   Sed Rate 115 (H) 0 - 22 mm/hr    Comment: Performed at [facility] [address], [place], [place] [address]  Vitamin B12     Status: None   Collection Time: 06/24/18  4:54 AM  Result Value Ref Range   Vitamin B-12 290 180 - 914 pg/mL    Comment: (NOTE) This assay is not validated for testing neonatal or myeloproliferative syndrome specimens for Vitamin B12 levels. Performed at [facility], [place] [address] [place], [place] [address]   Iron and TIBC     Status: Abnormal   Collection Time: 06/24/18  4:54 AM  Result Value Ref Range   Iron 15 (L) 28 - 170 ug/dL   TIBC 263 250 - 450 ug/dL   Saturation Ratios 6 (L) 10.4 - 31.8 %   UIBC 248 ug/dL    Comment: Performed at [facility], [place] [address], [place], [place] [address]  Ferritin     Status: None   Collection Time: 06/24/18  4:54 AM  Result Value Ref Range   Ferritin 65 11 - 307 ng/mL    Comment: Performed at [facility] [address] [place], [place] [address]  Reticulocytes     Status: Abnormal   Collection Time: 06/24/18  4:54 AM  Result Value Ref Range   Retic Ct Pct 1.6 0.4 - 3.1 %   RBC. 3.13 (L) 3.87 - 5.11 MIL/uL   Retic Count, Absolute 48.5 19.0 - 186.0 K/uL   Immature  Retic Fract 16.7 (H) 2.3 - 15.9 %    Comment: Performed at [facility] [address], [place], [place] [address]  Folate     Status: None   Collection Time: 06/24/18  4:54 AM  Result Value Ref Range   Folate 16.8 >5.9 ng/mL    Comment: Performed at [facility] [address] [place], [place] [address]  Glucose, capillary     Status: Abnormal   Collection Time: 06/24/18  6:37 AM  Result Value Ref Range   Glucose-Capillary 107 (H) 70 - 99 mg/dL  Glucose, capillary     Status: None   Collection Time: 06/24/18  7:45 AM  Result Value Ref Range   Glucose-Capillary 91 70 - 99 mg/dL  Troponin I     Status: Abnormal   Collection Time: 06/24/18  8:08 AM  Result Value Ref Range   Troponin I 0.64 (HH) <0.03 ng/mL    Comment: CRITICAL RESULT CALLED TO, READ BACK BY AND VERIFIED WITHTammi Klippel RN @ [phone] 06/24/18 LEONARD,A Performed at [facility], [place] [address] [place], [place] [address]   Basic metabolic panel     Status: Abnormal   Collection Time: 06/24/18  9:28 AM  Result Value Ref Range   Sodium 142 135 - 145 mmol/L   Potassium 3.5 3.5 - 5.1 mmol/L    Comment: RESULTS VERIFIED VIA RECOLLECT   Chloride  108 98 - 111 mmol/L   CO2 23 22 - 32 mmol/L   Glucose, Bld 129 (H) 70 - 99 mg/dL    Comment: RESULTS VERIFIED VIA RECOLLECT   BUN 19 6 - 20 mg/dL   Creatinine, Ser 1.08 (H) 0.44 - 1.00 mg/dL   Calcium 8.5 (L) 8.9 - 10.3 mg/dL   GFR calc non Af Amer 57 (L) >60 mL/min   GFR calc Af Amer >60 >60 mL/min    Comment: (NOTE) The eGFR has been calculated using the CKD EPI equation. This calculation has not been validated in all clinical situations. eGFR's persistently <60 mL/min signify possible Chronic Kidney Disease.    Anion gap 11 5 - 15    Comment: Performed at [facility] [address], [place], [place] [address]  Hepatic function panel     Status: Abnormal   Collection Time: 06/24/18  9:28 AM  Result Value Ref Range   Total Protein 6.9 6.5 - 8.1  g/dL   Albumin 2.6 (L) 3.5 - 5.0 g/dL   AST 22 15 - 41 U/L   ALT 20 0 - 44 U/L   Alkaline Phosphatase 87 38 - 126 U/L   Total Bilirubin 0.6 0.3 - 1.2 mg/dL   Bilirubin, Direct <0.1 0.0 - 0.2 mg/dL   Indirect Bilirubin NOT CALCULATED 0.3 - 0.9 mg/dL    Comment: Performed at [facility] [address], [place], [place] [address]  Magnesium     Status: None   Collection Time: 06/24/18  9:28 AM  Result Value Ref Range   Magnesium 1.8 1.7 - 2.4 mg/dL    Comment: Performed at [facility], [place] [address], [place], [place] [address]  Troponin I     Status: Abnormal   Collection Time: 06/24/18  9:28 AM  Result Value Ref Range   Troponin I 0.60 (HH) <0.03 ng/mL    Comment: CRITICAL VALUE NOTED.  VALUE IS CONSISTENT WITH PREVIOUSLY REPORTED AND CALLED VALUE. Performed at [facility], [place] [address], [place],  [address]   Lipid panel     Status: Abnormal   Collection Time: 06/24/18 10:45 AM  Result Value Ref Range   Cholesterol 200 0 - 200 mg/dL   Triglycerides 127 <150 mg/dL   HDL 36 (L) >40 mg/dL   Total CHOL/HDL Ratio 5.6 RATIO   VLDL 25 0 - 40 mg/dL   LDL Cholesterol 139 (H) 0 - 99 mg/dL    Comment:        Total Cholesterol/HDL:CHD Risk Coronary Heart Disease Risk Table                     Men   Women  1/2 Average Risk   3.4   3.3  Average Risk       5.0   4.4  2 X Average Risk   9.6   7.1  3 X Average Risk  23.4   11.0        Use the calculated Patient Ratio above and the CHD Risk Table to determine the patient's CHD Risk.        ATP III CLASSIFICATION (LDL):  <100     mg/dL   Optimal  100-129  mg/dL   Near or Above                    Optimal  130-159  mg/dL   Borderline  160-189  mg/dL   High  >  190     mg/dL   Very High Performed at [facility], [place] [address] [place], [place] [address]   Glucose, capillary     Status: Abnormal   Collection Time: 06/24/18 11:16 AM  Result Value Ref Range   Glucose-Capillary 133 (H) 70 - 99 mg/dL   Troponin I (q 6hr x 3)     Status: Abnormal   Collection Time: 06/24/18  3:11 PM  Result Value Ref Range   Troponin I 0.49 (HH) <0.03 ng/mL    Comment: CRITICAL VALUE NOTED.  VALUE IS CONSISTENT WITH PREVIOUSLY REPORTED AND CALLED VALUE. Performed at [facility], [place] [address], [place], [place] [address]   Protime-INR     Status: Abnormal   Collection Time: 06/24/18  3:11 PM  Result Value Ref Range   Prothrombin Time 15.7 (H) 11.4 - 15.2 seconds   INR 1.26     Comment: Performed at [facility] [address], [place], [place] [address]  Glucose, capillary     Status: None   Collection Time: 06/24/18  6:08 PM  Result Value Ref Range   Glucose-Capillary 96 70 - 99 mg/dL    Dg Chest 2 View  Result Date: 06/23/2018 CLINICAL DATA:  Shortness of breath with dry cough for 3 days. No fever. EXAM: CHEST - 2 VIEW COMPARISON:  03/09/2018 FINDINGS: Borderline heart size that is stable. There is diffuse interstitial opacity with [employer]. Possible trace effusions. IMPRESSION: CHF pattern. Electronically Signed   By: Monte Fantasia M.D.   On: 06/23/2018 21:33    Review of Systems  Constitutional: Positive for fever and malaise/fatigue. Negative for chills and diaphoresis.  Eyes:       Limited vision  Respiratory: Positive for shortness of breath. Negative for wheezing.   Cardiovascular: Negative for chest pain (negative for me,, positive in ED), palpitations and leg swelling (patient denies but noted on exam).  Gastrointestinal: Negative for nausea and vomiting.  Genitourinary: Negative for dysuria and urgency.  Neurological: Positive for weakness. Negative for focal weakness and loss of consciousness.  Psychiatric/Behavioral: Positive for depression.   Blood pressure 114/69, pulse 70, temperature 98.7 F (37.1 C), temperature source Oral, resp. rate 16, height 5' 2"  (1.575 m), weight 79.7 kg, last menstrual period 02/12/2011, SpO2 93 %. Physical Exam  Vitals  reviewed. Constitutional: She is oriented to person, place, and time. She appears well-developed and well-nourished. No distress.  HENT:  Head: Normocephalic and atraumatic.  Mouth/Throat: No oropharyngeal exudate.  Eyes: Conjunctivae are normal. No scleral icterus.  Neck: Neck supple. No thyromegaly present.  Cardiovascular: Normal rate, regular rhythm and normal heart sounds. Exam reveals no gallop and no friction rub.  No murmur heard. 2+ DP bilateral. Left Allen's test normal  Respiratory: Effort normal and breath sounds normal. No respiratory distress. She has no wheezes. She has no rales.  GI: Soft. She exhibits no distension. There is no tenderness.  Musculoskeletal: She exhibits edema (trace).  Lymphadenopathy:    She has no cervical adenopathy.  Neurological: She is alert and oriented to person, place, and time. No cranial nerve deficit. She exhibits normal muscle tone. Coordination normal.  Skin: Skin is warm and dry. She is not diaphoretic.   CARDIAC CATHETERIZATION Conclusion     Ost RPDA to RPDA lesion is 40% stenosed.  RPDA-1 lesion is 50% stenosed.  RPDA-2 lesion is 80% stenosed.  Prox RCA lesion is 20% stenosed.  Mid RCA lesion is 20% stenosed.  Mid  LM to Dist LM lesion is 80% stenosed.  Dist LM to Prox LAD lesion is 85% stenosed.  Prox LAD to Mid LAD lesion is 80% stenosed.  Ost 2nd Diag to 2nd Diag lesion is 95% stenosed.  Mid LAD lesion is 95% stenosed.  Dist LAD lesion is 99% stenosed.  Prox Cx to Mid Cx lesion is 60% stenosed.  LV end diastolic pressure is severely elevated.  There is moderate left ventricular systolic dysfunction.   Severe multivessel CAD with 80% distal left main stenosis, calcification of the proximal LAD with diffuse 85 to 90% stenosis before the first diagonal vessel, 80% stenoses between the first and second diagonal vessel with 95% ostial stenosis of the second diagonal and LAD beyond the diagonal vessel; the LAD is  subtotally occluded in the distal LAD after the third diagonal vessel with faint filling antegrade; 60% proximal left circumflex stenosis with 50% distal circumflex marginal stenosis; large dominant RCA with 20% tandem mid RCA stenoses, and PDA stenosis of 40% ostially 50% in the mid segment and 80% distally.  Moderate LV dysfunction with an ejection fraction of 40 to 45% with hypocontractility involving the mid distal anterolateral wall extending to the apex.  LVEDP is elevated at 29 mm.  RECOMMENDATION: Surgical consultation for CABG revascularization surgery.  Increase anti-ischemic medical therapy. She will be started on high potency statin therapy.  With high-grade disease consider heparinization but must follow hemoglobin closely with recent decreased hemoglobin.  Recommend Aspirin 7m daily for CAD and with need for CABG revascularization will not start P2Y12 inhibition.    I personally reviewed the cath images and concur with the findings noted above  Assessment/Plan: 52yo woman who presents with class IV acute systolic congestive heart failure +/- unstable chest pain and ruled in for MI. At cath she has left main CAD. Has significant proximal disease in LAD as well. RCA system has disease in distal PDA- not graftable.  CABG indicated for survival benefit and relief of symptoms.  I discussed the general nature of the procedure, including the need for general anesthesia, the use of cardiopulmonary bypass and the incisions to be used with Mrs. DKluttzand her family. We discussed the use of the left radial artery as a graft in addition to the LIMA and saphenous vein. We discussed the expected hospital stay, overall recovery and short and long term outcomes. I informed them of the indications, risks, benefits and alternatives. They understand the risks include, but are not limited to death, stroke, MI, DVT/PE, bleeding, possible need for transfusion, infections, cardiac arrhythmias, as  well as other organ system dysfunction including respiratory, renal, or GI complications.   She accepts the risks and agrees to proceed.  Plan OR next week with 1st available  SMelrose Nakayama10/17/2019, 7:21 PM

## 2018-06-24 NOTE — Progress Notes (Signed)
PROGRESS NOTE  Brief Narrative: [name] is a 52 y.o. female with a history of CVA, T2DM, PVD, chronic HFpEF, and depression with recent bereavement from her husband's death last month who presented to the ED due to chest pain and progressive exertional dyspnea for the past week. Chest pain is pressure across central/left chest that lasted about 5 minutes and abated spontaneously. She also reported some increased leg swelling, though this is waxing/waning chronically. She had unclear injury to the right 2nd toe and has a wound that does not appear infected, ABI checked and bilaterally normal. Work up in ED demonstrated inferior ST-T changes that are new and LVH with troponin elevated at 0.4. CXR demonstrated CHF pattern and BNP elevated to 335. Cardiology was consulted, troponins rose to 0.6 x2 and echocardiogram per report demonstrated new wall motion abnormalities in LAD distribution.   Subjective: She feels she's been unable to completely grieve for her husband, but has followed up with hospice and finds them and her son helpful supports. She works night shift as a Glass blower/designer and is concerned to miss work tonight because they are laying people off and she's already missed work related to her husband's illness. Denies current chest pain but had dyspnea with minimal exertion. No bleeding. Having significant urine output after lasix.  Objective: BP (!) 156/82   Pulse 78   Temp 98.7 F (37.1 C) (Oral)   Resp 17   Ht 5\' 2"  (1.575 m)   Wt 79.7 kg   LMP 02/12/2011 (Exact Date)   SpO2 97%   BMI 32.15 kg/m   Gen: No distress Pulm: Crackles at bases, nonlabored CV: RRR, 1+ LE edema, no JVD GI: Soft, NT, ND, +BS  Neuro: Alert and oriented. No focal deficits. Skin: Distal right 2nd toe with wound healed by secondary intention, hemostatic. No significant erythema and no discharge. Insensate in bilateral feet.   Assessment & Plan: NSTEMI: With LAD wall motion abnormalities. ?Takotsubo   - Start heparin - To cath lab today per cardiology - Continue ASA, statin  Acute on chronic HFpEF: Repeat echo formal read pending.  - Continue lasix q12h, trend weights, I/O, BMP in AM if patient amenable to staying  Otherwise per H&P from this morning.   Patrecia Pour, MD 06/24/2018, 4:02 PM

## 2018-06-24 NOTE — ED Notes (Signed)
Report given to rn on 6e  Ready not clean  wil call when room is ready

## 2018-06-24 NOTE — ED Notes (Signed)
Room not clean   yet

## 2018-06-24 NOTE — Progress Notes (Signed)
ANTICOAGULATION CONSULT NOTE - Initial Consult  Pharmacy Consult for heparin Indication: chest pain/ACS  Allergies  Allergen Reactions  . Lyrica [Pregabalin] Other (See Comments)    MADE PATIENT VERY EMOTIONAL AND WOULD CRY EASILY  . Morphine And Related Nausea And Vomiting  . Tramadol Nausea And Vomiting    Pt cant tolerate this pain med.     Patient Measurements: Height: 5\' 2"  (157.5 cm) Weight: 175 lb 12.8 oz (79.7 kg) IBW/kg (Calculated) : 50.1 Heparin Dosing Weight: 68kg  Vital Signs: Temp: 98.7 F (37.1 C) (10/17 1422) Temp Source: Oral (10/17 1422) BP: 159/81 (10/17 1918) Pulse Rate: 78 (10/17 1918)  Labs: Recent Labs    06/23/18 2116 06/24/18 0454 06/24/18 0808 06/24/18 0928 06/24/18 1511  HGB 9.7* 8.3*  --   --   --   HCT 32.4* 27.2*  --   --   --   PLT 570* 484*  --   --   --   LABPROT  --   --   --   --  15.7*  INR  --   --   --   --  1.26  CREATININE 1.01*  --   --  1.08*  --   TROPONINI  --   --  0.64* 0.60* 0.49*    Estimated Creatinine Clearance: 58.2 mL/min (A) (by C-G formula based on SCr of 1.08 mg/dL (H)).   Medical History: Past Medical History:  Diagnosis Date  . Acute on chronic diastolic CHF (congestive heart failure) ([place]) 06/24/2018  . Anxiety   . COMMON MIGRAINE 10/07/2010  . Decreased visual acuity 11/10/2016  . Depression 12/18/2012  . Diabetes mellitus type II, uncontrolled ([place]) 10/07/2010   Qualifier: Diagnosis of  By: Charlett Blake MD, [name]    . Diabetic foot infection ([place]) 08/26/2016  . Disturbance of skin sensation 10/07/2010  . Heart murmur   . History of kidney stones    "years ago"  . Hyperlipidemia 12/06/2010  . Hypertension   . Lipoma of abdominal wall 10/05/2016  . Overweight(278.02) 12/06/2010  . PERIPHERAL NEUROPATHY, FEET 10/07/2010  . PVD (peripheral vascular disease) ([place]) 01/21/2012  . RESTLESS LEG SYNDROME 10/25/2010  . Stroke [facility]) 2014, 2017   most recently in 2/17 - intracerebral hemorrhage     Assessment: 43  yoF admitted with CP and SOB to start on IV heparin prior to cardiac cath for ACS r/o. Trops up, Hgb low with hx of anemia on admission.  Goal of Therapy:  Heparin level 0.3-0.7 units/ml Monitor platelets by anticoagulation protocol: Yes   Plan:  -Heparin 4000 units x1 -Heparin 800 units/hr -Check 6-hr heparin level -Monitor heparin level, CBC, S/Sx bleeding daily  ADDENDUM: Pt now s/p with severe 3vCAD to undergo CABG workup and start IV heparin 8hr after sheath pull (~1730).  Plan: Resume heparin 800 units/hr no bolus at 0130 Check 6hr heparin level  Arrie Senate, PharmD, BCPS Clinical Pharmacist [phone] Please check AMION for all [place] numbers 06/24/2018

## 2018-06-24 NOTE — Progress Notes (Signed)
CRITICAL VALUE ALERT  Critical Value: TROPONIN=0.64  Date & Time Notied:  06/24/18 AT 0946  Provider Notified: DR Bonner Puna  Orders Received/Actions taken: NONE

## 2018-06-25 ENCOUNTER — Other Ambulatory Visit: Payer: Self-pay | Admitting: *Deleted

## 2018-06-25 ENCOUNTER — Inpatient Hospital Stay (HOSPITAL_COMMUNITY): Payer: BLUE CROSS/BLUE SHIELD

## 2018-06-25 ENCOUNTER — Encounter (HOSPITAL_COMMUNITY): Payer: Self-pay | Admitting: Cardiovascular Disease

## 2018-06-25 DIAGNOSIS — R011 Cardiac murmur, unspecified: Secondary | ICD-10-CM | POA: Diagnosis present

## 2018-06-25 DIAGNOSIS — I10 Essential (primary) hypertension: Secondary | ICD-10-CM

## 2018-06-25 DIAGNOSIS — Z806 Family history of leukemia: Secondary | ICD-10-CM | POA: Diagnosis not present

## 2018-06-25 DIAGNOSIS — Z951 Presence of aortocoronary bypass graft: Secondary | ICD-10-CM | POA: Diagnosis not present

## 2018-06-25 DIAGNOSIS — Z833 Family history of diabetes mellitus: Secondary | ICD-10-CM | POA: Diagnosis not present

## 2018-06-25 DIAGNOSIS — G2581 Restless legs syndrome: Secondary | ICD-10-CM | POA: Diagnosis not present

## 2018-06-25 DIAGNOSIS — Z791 Long term (current) use of non-steroidal anti-inflammatories (NSAID): Secondary | ICD-10-CM | POA: Diagnosis not present

## 2018-06-25 DIAGNOSIS — J9811 Atelectasis: Secondary | ICD-10-CM | POA: Diagnosis not present

## 2018-06-25 DIAGNOSIS — I2510 Atherosclerotic heart disease of native coronary artery without angina pectoris: Secondary | ICD-10-CM | POA: Diagnosis not present

## 2018-06-25 DIAGNOSIS — Z7984 Long term (current) use of oral hypoglycemic drugs: Secondary | ICD-10-CM | POA: Diagnosis not present

## 2018-06-25 DIAGNOSIS — Z7982 Long term (current) use of aspirin: Secondary | ICD-10-CM | POA: Diagnosis not present

## 2018-06-25 DIAGNOSIS — I25119 Atherosclerotic heart disease of native coronary artery with unspecified angina pectoris: Secondary | ICD-10-CM | POA: Diagnosis not present

## 2018-06-25 DIAGNOSIS — I5033 Acute on chronic diastolic (congestive) heart failure: Secondary | ICD-10-CM

## 2018-06-25 DIAGNOSIS — E1159 Type 2 diabetes mellitus with other circulatory complications: Secondary | ICD-10-CM | POA: Diagnosis not present

## 2018-06-25 DIAGNOSIS — Z8673 Personal history of transient ischemic attack (TIA), and cerebral infarction without residual deficits: Secondary | ICD-10-CM | POA: Diagnosis not present

## 2018-06-25 DIAGNOSIS — R079 Chest pain, unspecified: Secondary | ICD-10-CM

## 2018-06-25 DIAGNOSIS — I25110 Atherosclerotic heart disease of native coronary artery with unstable angina pectoris: Secondary | ICD-10-CM | POA: Diagnosis not present

## 2018-06-25 DIAGNOSIS — Z8261 Family history of arthritis: Secondary | ICD-10-CM | POA: Diagnosis not present

## 2018-06-25 DIAGNOSIS — H409 Unspecified glaucoma: Secondary | ICD-10-CM | POA: Diagnosis present

## 2018-06-25 DIAGNOSIS — E11319 Type 2 diabetes mellitus with unspecified diabetic retinopathy without macular edema: Secondary | ICD-10-CM | POA: Diagnosis present

## 2018-06-25 DIAGNOSIS — D638 Anemia in other chronic diseases classified elsewhere: Secondary | ICD-10-CM

## 2018-06-25 DIAGNOSIS — Z811 Family history of alcohol abuse and dependence: Secondary | ICD-10-CM | POA: Diagnosis not present

## 2018-06-25 DIAGNOSIS — I5043 Acute on chronic combined systolic (congestive) and diastolic (congestive) heart failure: Secondary | ICD-10-CM | POA: Diagnosis not present

## 2018-06-25 DIAGNOSIS — I400 Infective myocarditis: Secondary | ICD-10-CM | POA: Diagnosis not present

## 2018-06-25 DIAGNOSIS — D62 Acute posthemorrhagic anemia: Secondary | ICD-10-CM | POA: Diagnosis not present

## 2018-06-25 DIAGNOSIS — Z7951 Long term (current) use of inhaled steroids: Secondary | ICD-10-CM | POA: Diagnosis not present

## 2018-06-25 DIAGNOSIS — I110 Hypertensive heart disease with heart failure: Secondary | ICD-10-CM | POA: Diagnosis not present

## 2018-06-25 DIAGNOSIS — Z01810 Encounter for preprocedural cardiovascular examination: Secondary | ICD-10-CM

## 2018-06-25 DIAGNOSIS — E1151 Type 2 diabetes mellitus with diabetic peripheral angiopathy without gangrene: Secondary | ICD-10-CM | POA: Diagnosis not present

## 2018-06-25 DIAGNOSIS — E1142 Type 2 diabetes mellitus with diabetic polyneuropathy: Secondary | ICD-10-CM | POA: Diagnosis not present

## 2018-06-25 DIAGNOSIS — E785 Hyperlipidemia, unspecified: Secondary | ICD-10-CM | POA: Diagnosis not present

## 2018-06-25 DIAGNOSIS — Z823 Family history of stroke: Secondary | ICD-10-CM | POA: Diagnosis not present

## 2018-06-25 DIAGNOSIS — Z87442 Personal history of urinary calculi: Secondary | ICD-10-CM | POA: Diagnosis not present

## 2018-06-25 DIAGNOSIS — I214 Non-ST elevation (NSTEMI) myocardial infarction: Secondary | ICD-10-CM | POA: Diagnosis not present

## 2018-06-25 LAB — BASIC METABOLIC PANEL
Anion gap: 9 (ref 5–15)
BUN: 16 mg/dL (ref 6–20)
CO2: 24 mmol/L (ref 22–32)
Calcium: 8.5 mg/dL — ABNORMAL LOW (ref 8.9–10.3)
Chloride: 108 mmol/L (ref 98–111)
Creatinine, Ser: 0.92 mg/dL (ref 0.44–1.00)
GFR calc Af Amer: >60 mL/min (ref >60)
GFR calc non Af Amer: >60 mL/min (ref >60)
Glucose, Bld: 48 mg/dL — ABNORMAL LOW (ref 70–99)
Potassium: 3.2 mmol/L — ABNORMAL LOW (ref 3.5–5.1)
Sodium: 141 mmol/L (ref 135–145)

## 2018-06-25 LAB — PULMONARY FUNCTION TEST
FEF 25-75 Pre: 1.96 L/sec
FEF2575-%Pred-Pre: 77 %
FEV1-%Change-Post: -24 %
FEV1-%Pred-Post: 42 %
FEV1-%Pred-Pre: 56 %
FEV1-Post: 1.09 L
FEV1-Pre: 1.44 L
FEV1FVC-%Change-Post: 4 %
FEV1FVC-%Pred-Pre: 107 %
FEV6-%Change-Post: -28 %
FEV6-%Pred-Post: 37 %
FEV6-%Pred-Pre: 53 %
FEV6-Post: 1.2 L
FEV6-Pre: 1.69 L
FEV6FVC-%Pred-Post: 103 %
FEV6FVC-%Pred-Pre: 103 %
FVC-%Change-Post: -27 %
FVC-%Pred-Post: 37 %
FVC-%Pred-Pre: 51 %
FVC-Post: 1.22 L
FVC-Pre: 1.69 L
Post FEV1/FVC ratio: 90 %
Post FEV6/FVC ratio: 100 %
Pre FEV1/FVC ratio: 85 %
Pre FEV6/FVC Ratio: 100 %

## 2018-06-25 LAB — CBC
HCT: 26.7 % — ABNORMAL LOW (ref 36.0–46.0)
Hemoglobin: 8.4 g/dL — ABNORMAL LOW (ref 12.0–15.0)
MCH: 26.8 pg (ref 26.0–34.0)
MCHC: 31.5 g/dL (ref 30.0–36.0)
MCV: 85.3 fL (ref 80.0–100.0)
Platelets: 459 10*3/uL — ABNORMAL HIGH (ref 150–400)
RBC: 3.13 MIL/uL — ABNORMAL LOW (ref 3.87–5.11)
RDW: 13.6 % (ref 11.5–15.5)
WBC: 8.3 10*3/uL (ref 4.0–10.5)
nRBC: 0 % (ref 0.0–0.2)

## 2018-06-25 LAB — GLUCOSE, CAPILLARY
Glucose-Capillary: 62 mg/dL — ABNORMAL LOW (ref 70–99)
Glucose-Capillary: 82 mg/dL (ref 70–99)
Glucose-Capillary: 97 mg/dL (ref 70–99)
Glucose-Capillary: 131 mg/dL — ABNORMAL HIGH (ref 70–99)
Glucose-Capillary: 155 mg/dL — ABNORMAL HIGH (ref 70–99)

## 2018-06-25 LAB — HEPARIN LEVEL (UNFRACTIONATED)
Heparin Unfractionated: <0.1 IU/mL — ABNORMAL LOW (ref 0.30–0.70)
Heparin Unfractionated: <0.1 IU/mL — ABNORMAL LOW (ref 0.30–0.70)

## 2018-06-25 MED ORDER — ALBUTEROL SULFATE (2.5 MG/3ML) 0.083% IN NEBU
2.5000 mg | INHALATION_SOLUTION | Freq: Once | RESPIRATORY_TRACT | Status: AC
  Administered 2018-06-25: 2.5 mg via RESPIRATORY_TRACT

## 2018-06-25 MED ORDER — FERROUS GLUCONATE 324 (38 FE) MG PO TABS
324.0000 mg | ORAL_TABLET | Freq: Every day | ORAL | Status: DC
  Administered 2018-06-26 – 2018-06-27 (×2): 324 mg via ORAL
  Filled 2018-06-25 (×3): qty 1

## 2018-06-25 NOTE — H&P (View-Only) (Signed)
1 Day Post-Op Procedure(s) (LRB): LEFT HEART CATH AND CORONARY ANGIOGRAPHY (N/A) Subjective: No complaints this evening Remembers little of our conversation yesterday  Objective: Vital signs in last 24 hours: Temp:  [98.3 F (36.8 C)-98.7 F (37.1 C)] 98.7 F (37.1 C) (10/18 1453) Pulse Rate:  [62-75] 62 (10/18 1638) Cardiac Rhythm: Normal sinus rhythm (10/18 0700) Resp:  [16-22] 22 (10/18 0527) BP: (105-143)/(57-90) 105/57 (10/18 1638) SpO2:  [93 %-98 %] 96 % (10/18 1453)  Hemodynamic parameters for last 24 hours:    Intake/Output from previous day: 10/17 0701 - 10/18 0700 In: 434.8 [P.O.:120; I.V.:314.8] Out: 1925 [[id number]] Intake/Output this shift: No intake/output data recorded.  General appearance: alert, cooperative and no distress Neurologic: intact Heart: regular rate and rhythm Lungs: clear to auscultation bilaterally  Lab Results: Recent Labs    06/24/18 0454 06/25/18 0357  WBC 7.2 8.3  HGB 8.3* 8.4*  HCT 27.2* 26.7*  PLT 484* 459*   BMET:  Recent Labs    06/24/18 0928 06/25/18 0357  NA 142 141  K 3.5 3.2*  CL 108 108  CO2 23 24  GLUCOSE 129* 48*  BUN 19 16  CREATININE 1.08* 0.92  CALCIUM 8.5* 8.5*    PT/INR:  Recent Labs    06/24/18 1511  LABPROT 15.7*  INR 1.26   ABG    Component Value Date/Time   TCO2 25 03/30/2016 1051   CBG (last 3)  Recent Labs    06/25/18 0755 06/25/18 1134 06/25/18 1605  GLUCAP 82 97 131*    Assessment/Plan: S/P Procedure(s) (LRB): LEFT HEART CATH AND CORONARY ANGIOGRAPHY (N/A) 52 yo woman with multiple CRF presents with SOB. Does remember having CP as well this evening (denied it yesterday). W/u revealed left main CAD  I went over the proposed operation with her including conduct of the procedure. We again discussed the indications, risks, benefits and alternatives. She understands the risks include but are not limited to death, MI, stroke, DVT, PE, bleeding, possible need for transfusion,  infection, cardiac arrhythmias, respiratory or renal failure, as well as other unforeseeable complications. She accepts the risks and agrees to proceed.  OR 2nd case Monday    LOS: 0 days    [name] 06/25/2018

## 2018-06-25 NOTE — Progress Notes (Signed)
[place] for heparin Indication: chest pain/ACS  Allergies  Allergen Reactions  . Lyrica [Pregabalin] Other (See Comments)    MADE PATIENT VERY EMOTIONAL AND WOULD CRY EASILY  . Morphine And Related Nausea And Vomiting  . Tramadol Nausea And Vomiting    Pt cant tolerate this pain med.     Patient Measurements: Height: 5\' 2"  (157.5 cm) Weight: 175 lb 12.8 oz (79.7 kg) IBW/kg (Calculated) : 50.1 Heparin Dosing Weight: 68kg  Vital Signs: Temp: 98.6 F (37 C) (10/18 0527) Temp Source: Oral (10/18 0527) BP: 141/80 (10/18 0803) Pulse Rate: 75 (10/18 0803)  Labs: Recent Labs    06/23/18 2116 06/24/18 0454 06/24/18 0808 06/24/18 0928 06/24/18 1511 06/25/18 0357 06/25/18 0728  HGB 9.7* 8.3*  --   --   --  8.4*  --   HCT 32.4* 27.2*  --   --   --  26.7*  --   PLT 570* 484*  --   --   --  459*  --   LABPROT  --   --   --   --  15.7*  --   --   INR  --   --   --   --  1.26  --   --   HEPARINUNFRC  --   --   --   --   --   --  <0.10*  CREATININE 1.01*  --   --  1.08*  --  0.92  --   TROPONINI  --   --  0.64* 0.60* 0.49*  --   --     Estimated Creatinine Clearance: 68.3 mL/min (by C-G formula based on SCr of 0.92 mg/dL).   Medical History: Past Medical History:  Diagnosis Date  . Acute on chronic diastolic CHF (congestive heart failure) ([place]) 06/24/2018  . Anxiety   . COMMON MIGRAINE 10/07/2010  . Decreased visual acuity 11/10/2016  . Depression 12/18/2012  . Diabetes mellitus type II, uncontrolled ([place]) 10/07/2010   Qualifier: Diagnosis of  By: Charlett Blake MD, [name]    . Diabetic foot infection ([place]) 08/26/2016  . Disturbance of skin sensation 10/07/2010  . Heart murmur   . History of kidney stones    "years ago"  . Hyperlipidemia 12/06/2010  . Hypertension   . Lipoma of abdominal wall 10/05/2016  . Overweight(278.02) 12/06/2010  . PERIPHERAL NEUROPATHY, FEET 10/07/2010  . PVD (peripheral vascular disease) ([place]) 01/21/2012  . RESTLESS  LEG SYNDROME 10/25/2010  . Stroke [facility]) 2014, 2017   most recently in 2/17 - intracerebral hemorrhage    Assessment: 67 yoF admitted with CP and SOB to start on IV heparin prior to cardiac cath for ACS r/o.   Heparin infusion is running in L hand. Level came back undetectable, on 800 units/hr after restarting 8 hours after sheath pull. Hgb stable but low at 8.4, plt 459. No s/sx of bleeding. No infusion issues.   Goal of Therapy:  Heparin level 0.3-0.7 units/ml Monitor platelets by anticoagulation protocol: Yes   Plan:  -Increase heparin infusion to 1000 units/hr -Check 6-hr heparin level -Monitor heparin level, CBC, S/Sx bleeding daily -F/u CABG workup  Doylene Canard, PharmD Clinical Pharmacist  Pager: [phone] Phone: [phone] Please check AMION for all [place] numbers 06/25/2018

## 2018-06-25 NOTE — Progress Notes (Signed)
PROGRESS NOTE  [name]  ZOX:[id number] DOB: 06-01-1966 DOA: 06/23/2018 PCP: Mosie Lukes, MD   Brief Narrative: [name] is a 52 y.o. female with a history of CVA, T2DM, PVD, chronic HFpEF, and depression with recent bereavement from her husband's death last month who presented to the ED due to chest pain and progressive exertional dyspnea for the past week. Chest pain is pressure across central/left chest that lasted about 5 minutes and abated spontaneously. She also reported some increased leg swelling, though this is waxing/waning chronically. She had unclear injury to the right 2nd toe and has a wound that does not appear infected, ABI checked and bilaterally normal. Work up in ED demonstrated inferior ST-T changes that are new and LVH with troponin elevated at 0.4. CXR demonstrated CHF pattern and BNP elevated to 335. Cardiology was consulted, troponins rose to 0.6, wall motion abnormalities were seen on echocardiogram and the patient was taken to cardiac catheterization 10/17 which revealed proximal left main and LAD disease as well as distal RCA stenosis. Cardiothoracic surgery was consulted and after discussion with the patient, recommend CABG early next week. She is maintained on heparin gtt.  Assessment & Plan: Principal Problem:   Non-ST elevation (NSTEMI) myocardial infarction [facility]) Active Problems:   Essential hypertension, benign   Anemia of chronic disease   Chest pain   Type 2 diabetes mellitus with vascular disease (HCC)   Acute on chronic diastolic CHF (congestive heart failure) (HCC)  NSTEMI, severe diffuse CAD:  - CABG recommended, planned next week.  - Continue heparin, ASA, no plavix - High-intensity statin, BB  Acute on chronic combined HFrEF: EF by cath slightly reduced, G2DD on echo.  - Transition to po lasix tonight vs. tomorrow, defer to cardiology - Continue BB, ACE inhibitor - Daily weights, strict I/O, heart healthy diet  IDT2DM with  insensate peripheral neuropathy: Poorly controlled with hyperglycemia. Significant insulin use at home but not entirely clear what she takes. Prescribed 100u qAM and 50u qPM, pt usually takes 50u and 50u depending on blood sugar checks.  - Update A1c (last in March was 9.5%).  - Continue lantus 50 units this morning, will avoid PM dose with significant hypoglycemia this morning and continue SSI AC and HS with plans to augment if needed.  Right toe wound: Needs very close monitoring in pt with history of PVD and former toe amputation. ABI normal bilaterally - Local wound care, tight glucose control  Iron deficiency anemia: Hgb 9.7 > 8.3 and stable over past 24 hours at 8.4. No active bleeding noted.  - Iron and % sat low, ferritin on low end of normal. Start po iron. No colonoscopy reports in EMR. - Agree with FOBT check given ongoing need for blood thinning agents.   History of CVA:  - ASA, statin   Hypokalemia:  - Replace and recheck  Grief and depression:  - Support provided - Continue SSRI, offered chaplain  Hypothyroidism: TSH 2.674 - Continue low dose synthroid  DVT prophylaxis: Heparin gtt Code Status: Full Family Communication: None at bedside Disposition Plan: Uncertain, pending postoperative course.   Consultants:   Cardiology  Cardiothoracic surgery  Procedures:  CARDIAC CATH: 06/24/2018  Ost RPDA to RPDA lesion is 40% stenosed.  RPDA-1 lesion is 50% stenosed.  RPDA-2 lesion is 80% stenosed.  Prox RCA lesion is 20% stenosed.  Mid RCA lesion is 20% stenosed.  Mid LM to Dist LM lesion is 80% stenosed.  Dist LM to Prox LAD lesion is  85% stenosed.  Prox LAD to Mid LAD lesion is 80% stenosed.  Ost 2nd Diag to 2nd Diag lesion is 95% stenosed.  Mid LAD lesion is 95% stenosed.  Dist LAD lesion is 99% stenosed.  Prox Cx to Mid Cx lesion is 60% stenosed.  LV end diastolic pressure is severely elevated.  There is moderate left ventricular systolic  dysfunction.  Severe multivessel CAD with 80% distal left main stenosis, calcification of the proximal LAD with diffuse 85 to 90% stenosis before the first diagonal vessel, 80% stenoses between the first and second diagonal vessel with 95% ostial stenosis of the second diagonal and LAD beyond the diagonal vessel; the LAD is subtotally occluded in the distal LAD after the third diagonal vessel with faint filling antegrade; 60% proximal left circumflex stenosis with 50% distal circumflex marginal stenosis; large dominant RCA with 20% tandem mid RCA stenoses, and PDA stenosis of 40% ostially 50% in the mid segment and 80% distally.  Moderate LV dysfunction with an ejection fraction of 40 to 45% with hypocontractility involving the mid distal anterolateral wall extending to the apex. LVEDP is elevated at 29 mm.  RECOMMENDATION: Surgical consultation for CABG revascularization surgery. Increase anti-ischemic medical therapy. She will be started on high potency statin therapy. With high-grade disease consider heparinization but must follow hemoglobin closely with recent decreased hemoglobin.  Recommend Aspirin 81mg  daily for CAD and with need for CABG revascularization will not start P2Y12 inhibition.  ECHO:  06/24/2018 - Left ventricle: The cavity size was normal. Wall thickness was increased in a pattern of mild LVH. Systolic function was low normal to mildly reduced. The estimated ejection fraction was in the range of 50% to 55%. Anterior and anterolateral hypokinesis. Features are consistent with a pseudonormal left ventricular filling pattern, with concomitant abnormal relaxation and increased filling pressure (grade 2 diastolic dysfunction). - Aortic valve: There was no stenosis. - Mitral valve: There was no significant regurgitation. - Left atrium: The atrium was mildly dilated. - Right ventricle: The cavity size was normal. Systolic function was normal. - Pulmonary  arteries: No complete TR doppler jet so unable to estimate PA systolic pressure. - Systemic veins: IVC measured 1.8 cm with < 50% respirophasic variation, suggesting RA pressure 8 mmHg.  Impressions:  - Normal LV size with mild LV hypertrophy. EF 50-55%, anterior and anterolateral hypokinesis. Normal RV size and systolic function. No significant valvular abnormalities.  Antimicrobials:  None   Subjective: Depressed, still trying to process the news given to her last night. Does not want to miss work, asking about the scar after the surgery, recovery, risk of perioperative death, etc.   Objective: Vitals:   06/25/18 0527 06/25/18 0803 06/25/18 1045 06/25/18 1453  BP: 131/75 (!) 141/80 138/81 (!) 143/75  Pulse: 66 75  73  Resp: (!) 22     Temp: 98.6 F (37 C)   98.7 F (37.1 C)  TempSrc: Oral   Oral  SpO2: 98%   96%  Weight:      Height:        Intake/Output Summary (Last 24 hours) at 06/25/2018 1601 Last data filed at 06/25/2018 1316 Gross per 24 hour  Intake 494.81 ml  Output 2425 ml  Net -1930.19 ml   Filed Weights   06/24/18 0233  Weight: 79.7 kg    Gen: 52 y.o. female in no distress Pulm: Non-labored breathing room air. Crackles improved, now clear to auscultation bilaterally.  CV: Regular rate and rhythm. No murmur, rub, or gallop. No JVD,  trace pedal edema. Cap refill brisk and right radial without erythema/ecchymosis. GI: Abdomen soft, non-tender, non-distended, with normoactive bowel sounds. No organomegaly or masses felt. Ext: Warm, no deformities Skin: No rashes, lesions or ulcers Neuro: Alert and oriented. No focal neurological deficits. Psych: Judgement and insight appear normal. Mood & affect appropriate.   Data Reviewed: I have personally reviewed following labs and imaging studies  CBC: Recent Labs  Lab 06/23/18 2116 06/24/18 0454 06/25/18 0357  WBC 11.1* 7.2 8.3  NEUTROABS 9.0*  --   --   HGB 9.7* 8.3* 8.4*  HCT 32.4* 27.2*  26.7*  MCV 86.9 86.9 85.3  PLT 570* 484* 010*   Basic Metabolic Panel: Recent Labs  Lab 06/23/18 2116 06/24/18 0928 06/25/18 0357  NA 140 142 141  K 3.3* 3.5 3.2*  CL 103 108 108  CO2 24 23 24   GLUCOSE 137* 129* 48*  BUN 18 19 16   CREATININE 1.01* 1.08* 0.92  CALCIUM 8.8* 8.5* 8.5*  MG  --  1.8  --    GFR: Estimated Creatinine Clearance: 68.3 mL/min (by C-G formula based on SCr of 0.92 mg/dL). Liver Function Tests: Recent Labs  Lab 06/23/18 2116 06/24/18 0928  AST 17 22  ALT 21 20  ALKPHOS 98 87  BILITOT 0.5 0.6  PROT 7.1 6.9  ALBUMIN 2.6* 2.6*   No results for input(s): LIPASE, AMYLASE in the last 168 hours. No results for input(s): AMMONIA in the last 168 hours. Coagulation Profile: Recent Labs  Lab 06/24/18 1511  INR 1.26   Cardiac Enzymes: Recent Labs  Lab 06/24/18 0808 06/24/18 0928 06/24/18 1511  TROPONINI 0.64* 0.60* 0.49*   BNP (last 3 results) No results for input(s): PROBNP in the last 8760 hours. HbA1C: No results for input(s): HGBA1C in the last 72 hours. CBG: Recent Labs  Lab 06/24/18 1808 06/24/18 2108 06/25/18 0723 06/25/18 0755 06/25/18 1134  GLUCAP 96 202* 62* 82 97   Lipid Profile: Recent Labs    06/24/18 1045  CHOL 200  HDL 36*  LDLCALC 139*  TRIG 127  CHOLHDL 5.6   Thyroid Function Tests: Recent Labs    06/24/18 0454  TSH 2.674   Anemia Panel: Recent Labs    06/24/18 0454  VITAMINB12 290  FOLATE 16.8  FERRITIN 65  TIBC 263  IRON 15*  RETICCTPCT 1.6   Urine analysis:    Component Value Date/Time   COLORURINE YELLOW 06/23/2018 2111   APPEARANCEUR HAZY (A) 06/23/2018 2111   LABSPEC 1.019 06/23/2018 2111   PHURINE 6.0 06/23/2018 2111   GLUCOSEU 150 (A) 06/23/2018 2111   GLUCOSEU NEGATIVE 05/21/2015 0831   HGBUR MODERATE (A) 06/23/2018 2111   BILIRUBINUR NEGATIVE 06/23/2018 2111   KETONESUR NEGATIVE 06/23/2018 2111   PROTEINUR >=300 (A) 06/23/2018 2111   UROBILINOGEN 0.2 05/21/2015 0831   NITRITE  NEGATIVE 06/23/2018 2111   LEUKOCYTESUR NEGATIVE 06/23/2018 2111   Recent Results (from the past 240 hour(s))  Blood Culture (routine x 2)     Status: None (Preliminary result)   Collection Time: 06/23/18  8:45 PM  Result Value Ref Range Status   Specimen Description BLOOD RIGHT ARM  Final   Special Requests   Final    BOTTLES DRAWN AEROBIC AND ANAEROBIC Blood Culture results may not be optimal due to an excessive volume of blood received in culture bottles   Culture   Final    NO GROWTH 2 DAYS Performed at [facility], [place] [address] [place], [place] [address]  Report Status PENDING  Incomplete  Blood Culture (routine x 2)     Status: None (Preliminary result)   Collection Time: 06/23/18  9:08 PM  Result Value Ref Range Status   Specimen Description BLOOD RIGHT ARM  Final   Special Requests   Final    BOTTLES DRAWN AEROBIC ONLY Blood Culture adequate volume   Culture   Final    NO GROWTH 2 DAYS Performed at [facility], [address] [place], [place] [address]    Report Status PENDING  Incomplete      Radiology Studies: Dg Chest 2 View  Result Date: 06/23/2018 CLINICAL DATA:  Shortness of breath with dry cough for 3 days. No fever. EXAM: CHEST - 2 VIEW COMPARISON:  03/09/2018 FINDINGS: Borderline heart size that is stable. There is diffuse interstitial opacity with [employer]. Possible trace effusions. IMPRESSION: CHF pattern. Electronically Signed   By: Monte Fantasia M.D.   On: 06/23/2018 21:33    Scheduled Meds: . aspirin EC  81 mg Oral Daily  . atorvastatin  80 mg Oral q1800  . carvedilol  6.25 mg Oral BID WC  . escitalopram  10 mg Oral Daily  . furosemide  40 mg Intravenous Q12H  . gabapentin  300 mg Oral TID  . insulin aspart  0-9 Units Subcutaneous TID WC  . insulin glargine  50 Units Subcutaneous Daily  . levothyroxine  25 mcg Oral QAC breakfast  . lisinopril  20 mg Oral BID  . mupirocin ointment   Topical Daily  . potassium chloride  20  mEq Oral Daily  . sodium chloride flush  3 mL Intravenous Q12H   Continuous Infusions: . sodium chloride    . heparin 1,000 Units/hr (06/25/18 0901)     LOS: 0 days   Time spent: 35 minutes.  Patrecia Pour, MD [facility] [email or web address] Password [email or web address] 06/25/2018, 4:01 PM

## 2018-06-25 NOTE — Progress Notes (Addendum)
Progress Note  Patient Name: NAIOMY Davis Date of Encounter: 06/25/2018  Primary Cardiologist:  Elouise Munroe, MD  Subjective   Does not remember Dr Roxan Hockey coming in, has questions.  Wants to know if she can go home  Inpatient Medications    Scheduled Meds: . aspirin EC  81 mg Oral Daily  . atorvastatin  80 mg Oral q1800  . carvedilol  6.25 mg Oral BID WC  . escitalopram  10 mg Oral Daily  . furosemide  40 mg Intravenous Q12H  . gabapentin  300 mg Oral TID  . insulin aspart  0-9 Units Subcutaneous TID WC  . insulin glargine  50 Units Subcutaneous Daily  . levothyroxine  25 mcg Oral QAC breakfast  . lisinopril  20 mg Oral BID  . mupirocin ointment   Topical Daily  . potassium chloride  20 mEq Oral Daily  . sodium chloride flush  3 mL Intravenous Q12H   Continuous Infusions: . sodium chloride    . heparin 1,000 Units/hr (06/25/18 0901)   PRN Meds: sodium chloride, acetaminophen **OR** acetaminophen, diazepam, fluticasone, nitroGLYCERIN, ondansetron **OR** ondansetron (ZOFRAN) IV, sodium chloride flush, tiZANidine   Vital Signs    Vitals:   06/24/18 2111 06/24/18 2241 06/25/18 0527 06/25/18 0803  BP: 127/74 138/90 131/75 (!) 141/80  Pulse: 75  66 75  Resp: 16  (!) 22   Temp: 98.3 F (36.8 C)  98.6 F (37 C)   TempSrc: Oral  Oral   SpO2: 97%  98%   Weight:      Height:        Intake/Output Summary (Last 24 hours) at 06/25/2018 1013 Last data filed at 06/25/2018 0806 Gross per 24 hour  Intake 494.81 ml  Output 1925 ml  Net -1430.19 ml   Filed Weights   06/24/18 0233  Weight: 79.7 kg    Telemetry    SR, rare PVCs - Personally Reviewed  ECG    None today - Personally Reviewed  Physical Exam   General: Well developed, well nourished, female appearing in no acute distress. Head: Normocephalic, atraumatic.  Neck: Supple without bruits, JVD not elevated. Lungs:  Resp regular and unlabored, CTA. Heart: RRR, S1, S2, no S3, S4, or  murmur; no rub. Abdomen: Soft, non-tender, non-distended with normoactive bowel sounds. No hepatomegaly. No rebound/guarding. No obvious abdominal masses. Extremities: No clubbing, cyanosis, no edema. Distal pedal pulses are 2+ bilaterally. R radial cath site w/out ecchymosis or hematoma Neuro: Alert and oriented X 3. Moves all extremities spontaneously. Psych: Normal affect.  Labs    Hematology Recent Labs  Lab 06/23/18 2116 06/24/18 0454 06/25/18 0357  WBC 11.1* 7.2 8.3  RBC 3.73* 3.13*  3.13* 3.13*  HGB 9.7* 8.3* 8.4*  HCT 32.4* 27.2* 26.7*  MCV 86.9 86.9 85.3  MCH 26.0 26.5 26.8  MCHC 29.9* 30.5 31.5  RDW 13.3 13.5 13.6  PLT 570* 484* 459*    Chemistry Recent Labs  Lab 06/23/18 2116 06/24/18 0928 06/25/18 0357  NA 140 142 141  K 3.3* 3.5 3.2*  CL 103 108 108  CO2 24 23 24   GLUCOSE 137* 129* 48*  BUN 18 19 16   CREATININE 1.01* 1.08* 0.92  CALCIUM 8.8* 8.5* 8.5*  PROT 7.1 6.9  --   ALBUMIN 2.6* 2.6*  --   AST 17 22  --   ALT 21 20  --   ALKPHOS 98 87  --   BILITOT 0.5 0.6  --   GFRNONAA >60 57* >  60  GFRAA >60 >60 >60  ANIONGAP 13 11 9      Cardiac Enzymes Recent Labs  Lab 06/24/18 0808 06/24/18 0928 06/24/18 1511  TROPONINI 0.64* 0.60* 0.49*    Recent Labs  Lab 06/23/18 2252  TROPIPOC 0.40*     BNP Recent Labs  Lab 06/23/18 2116  BNP 335.2*     Radiology    Dg Chest 2 View  Result Date: 06/23/2018 CLINICAL DATA:  Shortness of breath with dry cough for 3 days. No fever. EXAM: CHEST - 2 VIEW COMPARISON:  03/09/2018 FINDINGS: Borderline heart size that is stable. There is diffuse interstitial opacity with [employer]. Possible trace effusions. IMPRESSION: CHF pattern. Electronically Signed   By: Monte Fantasia M.D.   On: 06/23/2018 21:33     Cardiac Studies   CARDIAC CATH: 06/24/2018  Ost RPDA to RPDA lesion is 40% stenosed.  RPDA-1 lesion is 50% stenosed.  RPDA-2 lesion is 80% stenosed.  Prox RCA lesion is 20%  stenosed.  Mid RCA lesion is 20% stenosed.  Mid LM to Dist LM lesion is 80% stenosed.  Dist LM to Prox LAD lesion is 85% stenosed.  Prox LAD to Mid LAD lesion is 80% stenosed.  Ost 2nd Diag to 2nd Diag lesion is 95% stenosed.  Mid LAD lesion is 95% stenosed.  Dist LAD lesion is 99% stenosed.  Prox Cx to Mid Cx lesion is 60% stenosed.  LV end diastolic pressure is severely elevated.  There is moderate left ventricular systolic dysfunction.   Severe multivessel CAD with 80% distal left main stenosis, calcification of the proximal LAD with diffuse 85 to 90% stenosis before the first diagonal vessel, 80% stenoses between the first and second diagonal vessel with 95% ostial stenosis of the second diagonal and LAD beyond the diagonal vessel; the LAD is subtotally occluded in the distal LAD after the third diagonal vessel with faint filling antegrade; 60% proximal left circumflex stenosis with 50% distal circumflex marginal stenosis; large dominant RCA with 20% tandem mid RCA stenoses, and PDA stenosis of 40% ostially 50% in the mid segment and 80% distally.  Moderate LV dysfunction with an ejection fraction of 40 to 45% with hypocontractility involving the mid distal anterolateral wall extending to the apex.  LVEDP is elevated at 29 mm.  RECOMMENDATION: Surgical consultation for CABG revascularization surgery.  Increase anti-ischemic medical therapy. She will be started on high potency statin therapy.  With high-grade disease consider heparinization but must follow hemoglobin closely with recent decreased hemoglobin.  Recommend Aspirin 81mg  daily for CAD and with need for CABG revascularization will not start P2Y12 inhibition.  ECHO:  06/24/2018 - Left ventricle: The cavity size was normal. Wall thickness was   increased in a pattern of mild LVH. Systolic function was low   normal to mildly reduced. The estimated ejection fraction was in   the range of 50% to 55%. Anterior and  anterolateral hypokinesis.   Features are consistent with a pseudonormal left ventricular   filling pattern, with concomitant abnormal relaxation and   increased filling pressure (grade 2 diastolic dysfunction). - Aortic valve: There was no stenosis. - Mitral valve: There was no significant regurgitation. - Left atrium: The atrium was mildly dilated. - Right ventricle: The cavity size was normal. Systolic function   was normal. - Pulmonary arteries: No complete TR doppler jet so unable to   estimate PA systolic pressure. - Systemic veins: IVC measured 1.8 cm with < 50% respirophasic   variation, suggesting RA pressure 8  mmHg.  Impressions:  - Normal LV size with mild LV hypertrophy. EF 50-55%, anterior and   anterolateral hypokinesis. Normal RV size and systolic function.   No significant valvular abnormalities.   Patient Profile     52 y.o. female w/ hx D-CHF, HTN, HLD, DM2, CVA x 2, PAD, was admitted 10/17 w/ CP>>cards consult.   Assessment & Plan     Principal Problem: 1.  Non-ST elevation (NSTEMI) myocardial infarction [facility]) - Cath results above, 3 v dz - Dr Roxan Hockey saw>>CABG early next week - continue ASA, high-dose statin, BB, ACE - continue heparin - if CP>>add nitrates - ck lipids  2. Anemia of chronic disease - H&H trending down  - hx mild anemia but on admission was lower than normal - MCV normal - guaiac stools - otherwise, per IM  Otherwise, per IM Active Problems:   Essential hypertension, benign   Anemia of chronic disease   Chest pain   Type 2 diabetes mellitus with vascular disease (HCC)   Acute on chronic diastolic CHF (congestive heart failure) ([place])   Signed, Rosaria Ferries , PA-C 10:13 AM 06/25/2018 Pager: [phone]  ---------------------------------------------------------------------------------------------   History and all data above reviewed.  Patient examined.  I agree with the findings as above.  TEMEKA PORE is  feeling ok today but is apprehensive about open heart surgery.  Constitutional: No acute distress Eyes: pupils equally round and reactive to light, sclera non-icteric, normal conjunctiva and lids ENMT: normal dentition, moist mucous membranes Cardiovascular: regular rhythm, normal rate, no murmurs. S1 and S2 normal. Radial pulses normal bilaterally. No jugular venous distention.  Respiratory: clear to auscultation bilaterally GI : normal bowel sounds, soft and nontender. No distention.   MSK: extremities warm, well perfused. No edema.  LYMPH: No lymphadenopathy noted of the head and neck NEURO: grossly nonfocal exam, moves all extremities. PSYCH: alert and oriented x 3, normal mood and affect.   All available labs, radiology testing, previous records reviewed. Agree with documented assessment and plan of my colleague as stated above with the following additions or changes:  Principal Problem:   Non-ST elevation (NSTEMI) myocardial infarction [facility]) Active Problems:   Essential hypertension, benign   Anemia of chronic disease   Chest pain   Type 2 diabetes mellitus with vascular disease (HCC)   Acute on chronic diastolic CHF (congestive heart failure) (HCC)   Coronary artery disease involving native coronary artery of native heart with angina pectoris ([place])   Plan: Her coronary angiogram yesterday demonstrated severe multivessel CAD with 80% distal left main stenosis, and proximal LAD with diffuse 85 to 90% stenosis before the first diagonal vessel, with severe lesions in the mid and distal LAD, as well as a 80% distal PDA stenosis.  LV EF was felt to be 40 to 45% on cath and 50-55% on echo.  Surgical consultation was placed by Dr. Claiborne Billings following coronary angiography, and coronary artery bypass grafting was recommended by Dr. Leonarda Salon service.  This is planned for next week.  Optimization prior to coronary artery bypass grafting includes heparin infusion until surgery given severe  left main lesion, and continued hospitalization.  Her medical regimen will include aspirin 81 mg daily, atorvastatin 80 mg daily, carvedilol 6.25 mg twice daily, lisinopril 20 mg twice daily, nitroglycerin as needed.  She is also on furosemide 40 mg twice daily for diuresis.  Her diuresis can likely be de-escalated tomorrow.  I have discussed in detail the indications for hospitalization and need to consult with surgery for  further questions about CABG, and answered her questions to the best of my ability. Her family was not present at the time of my rounds today but I will be available to answer their questions tomorrow if needed.   Elouise Munroe, MD [facility] 4:10 PM  06/25/2018

## 2018-06-25 NOTE — Progress Notes (Addendum)
Hypoglycemic Event  CBG: 62 at 0723  Treatment: OJ X1  Symptoms: NONE  Follow-up CBG: Time: 0174 CBG Result:  82  Possible Reasons for Event:  Poor po intake the day before and high dose on LANTUS   Comments/MD notified:    Nechama Guard

## 2018-06-25 NOTE — Progress Notes (Signed)
[place] for heparin Indication: chest pain/ACS  Allergies  Allergen Reactions  . Lyrica [Pregabalin] Other (See Comments)    MADE PATIENT VERY EMOTIONAL AND WOULD CRY EASILY  . Morphine And Related Nausea And Vomiting  . Tramadol Nausea And Vomiting    Pt cant tolerate this pain med.     Patient Measurements: Height: 5\' 2"  (157.5 cm) Weight: 175 lb 12.8 oz (79.7 kg) IBW/kg (Calculated) : 50.1 Heparin Dosing Weight: 68kg  Vital Signs: Temp: 98.7 F (37.1 C) (10/18 1453) Temp Source: Oral (10/18 1453) BP: 105/57 (10/18 1638) Pulse Rate: 62 (10/18 1638)  Labs: Recent Labs    06/23/18 2116 06/24/18 0454 06/24/18 0808 06/24/18 0928 06/24/18 1511 06/25/18 0357 06/25/18 0728 06/25/18 1523  HGB 9.7* 8.3*  --   --   --  8.4*  --   --   HCT 32.4* 27.2*  --   --   --  26.7*  --   --   PLT 570* 484*  --   --   --  459*  --   --   LABPROT  --   --   --   --  15.7*  --   --   --   INR  --   --   --   --  1.26  --   --   --   HEPARINUNFRC  --   --   --   --   --   --  <0.10* <0.10*  CREATININE 1.01*  --   --  1.08*  --  0.92  --   --   TROPONINI  --   --  0.64* 0.60* 0.49*  --   --   --     Estimated Creatinine Clearance: 68.3 mL/min (by C-G formula based on SCr of 0.92 mg/dL).   Medical History: Past Medical History:  Diagnosis Date  . Acute on chronic diastolic CHF (congestive heart failure) ([place]) 06/24/2018  . Anxiety   . COMMON MIGRAINE 10/07/2010  . Decreased visual acuity 11/10/2016  . Depression 12/18/2012  . Diabetes mellitus type II, uncontrolled ([place]) 10/07/2010   Qualifier: Diagnosis of  By: Charlett Blake MD, [name]    . Diabetic foot infection ([place]) 08/26/2016  . Disturbance of skin sensation 10/07/2010  . Heart murmur   . History of kidney stones    "years ago"  . Hyperlipidemia 12/06/2010  . Hypertension   . Lipoma of abdominal wall 10/05/2016  . Overweight(278.02) 12/06/2010  . PERIPHERAL NEUROPATHY, FEET 10/07/2010  . PVD  (peripheral vascular disease) ([place]) 01/21/2012  . RESTLESS LEG SYNDROME 10/25/2010  . Stroke [facility]) 2014, 2017   most recently in 2/17 - intracerebral hemorrhage    Assessment: 3 yoF admitted with CP and SOB to start on IV heparin prior to cardiac cath for ACS r/o.   Heparin remains undetectable after rate increase. Per RN no issues with infusion.  Goal of Therapy:  Heparin level 0.3-0.7 units/ml Monitor platelets by anticoagulation protocol: Yes   Plan:  -Increase heparin infusion to 1300 units/hr -Recheck heparin level with am labs  Arrie Senate, PharmD, BCPS Clinical Pharmacist [phone] Please check AMION for all [place] numbers 06/25/2018

## 2018-06-25 NOTE — Progress Notes (Signed)
Pre-op Cardiac Surgery  Carotid Findings:  1% to 39% ICA stenosis. Vertebral arery flow is antegrade.  Upper Extremity Right Left  Brachial Pressures 171 Triphasic Not obtained due to restricted arm band. Triphasic  Radial Waveforms Triphasic Triphasic  Ulnar Waveforms Triphasic Triphasic  Palmar Arch (Allen's Test) Normal Normal   Findings:  Palmar arch evaluation - Doppler waveforms remained normal bilaterally with both radial and ulnar compressions.    Lower  Extremity Right Left  Dorsalis Pedis    Anterior Tibial    Posterior Tibial    Ankle/Brachial Indices      Findings:  ABIs completed 06/24/2018 - results in [place],RVS 06/25/2018, 5:20 PM

## 2018-06-25 NOTE — Progress Notes (Signed)
1 Day Post-Op Procedure(s) (LRB): LEFT HEART CATH AND CORONARY ANGIOGRAPHY (N/A) Subjective: No complaints this evening Remembers little of our conversation yesterday  Objective: Vital signs in last 24 hours: Temp:  [98.3 F (36.8 C)-98.7 F (37.1 C)] 98.7 F (37.1 C) (10/18 1453) Pulse Rate:  [62-75] 62 (10/18 1638) Cardiac Rhythm: Normal sinus rhythm (10/18 0700) Resp:  [16-22] 22 (10/18 0527) BP: (105-143)/(57-90) 105/57 (10/18 1638) SpO2:  [93 %-98 %] 96 % (10/18 1453)  Hemodynamic parameters for last 24 hours:    Intake/Output from previous day: 10/17 0701 - 10/18 0700 In: 434.8 [P.O.:120; I.V.:314.8] Out: 1925 [FGBMS:1115] Intake/Output this shift: No intake/output data recorded.  General appearance: alert, cooperative and no distress Neurologic: intact Heart: regular rate and rhythm Lungs: clear to auscultation bilaterally  Lab Results: Recent Labs    06/24/18 0454 06/25/18 0357  WBC 7.2 8.3  HGB 8.3* 8.4*  HCT 27.2* 26.7*  PLT 484* 459*   BMET:  Recent Labs    06/24/18 0928 06/25/18 0357  NA 142 141  K 3.5 3.2*  CL 108 108  CO2 23 24  GLUCOSE 129* 48*  BUN 19 16  CREATININE 1.08* 0.92  CALCIUM 8.5* 8.5*    PT/INR:  Recent Labs    06/24/18 1511  LABPROT 15.7*  INR 1.26   ABG    Component Value Date/Time   TCO2 25 03/30/2016 1051   CBG (last 3)  Recent Labs    06/25/18 0755 06/25/18 1134 06/25/18 1605  GLUCAP 82 97 131*    Assessment/Plan: S/P Procedure(s) (LRB): LEFT HEART CATH AND CORONARY ANGIOGRAPHY (N/A) 52 yo woman with multiple CRF presents with SOB. Does remember having CP as well this evening (denied it yesterday). W/u revealed left main CAD  I went over the proposed operation with her including conduct of the procedure. We again discussed the indications, risks, benefits and alternatives. She understands the risks include but are not limited to death, MI, stroke, DVT, PE, bleeding, possible need for transfusion,  infection, cardiac arrhythmias, respiratory or renal failure, as well as other unforeseeable complications. She accepts the risks and agrees to proceed.  OR 2nd case Monday    LOS: 0 days    [name] 06/25/2018

## 2018-06-26 DIAGNOSIS — I2510 Atherosclerotic heart disease of native coronary artery without angina pectoris: Secondary | ICD-10-CM

## 2018-06-26 LAB — BASIC METABOLIC PANEL
Anion gap: 8 (ref 5–15)
BUN: 24 mg/dL — ABNORMAL HIGH (ref 6–20)
CO2: 29 mmol/L (ref 22–32)
Calcium: 8.6 mg/dL — ABNORMAL LOW (ref 8.9–10.3)
Chloride: 102 mmol/L (ref 98–111)
Creatinine, Ser: 1.23 mg/dL — ABNORMAL HIGH (ref 0.44–1.00)
GFR calc Af Amer: 57 mL/min — ABNORMAL LOW (ref >60)
GFR calc non Af Amer: 49 mL/min — ABNORMAL LOW (ref >60)
Glucose, Bld: 160 mg/dL — ABNORMAL HIGH (ref 70–99)
Potassium: 3.3 mmol/L — ABNORMAL LOW (ref 3.5–5.1)
Sodium: 139 mmol/L (ref 135–145)

## 2018-06-26 LAB — URINALYSIS, ROUTINE W REFLEX MICROSCOPIC
Bilirubin Urine: NEGATIVE
Glucose, UA: NEGATIVE mg/dL
Ketones, ur: NEGATIVE mg/dL
Nitrite: NEGATIVE
Protein, ur: 100 mg/dL — AB
Specific Gravity, Urine: 1.014 (ref 1.005–1.030)
pH: 5 (ref 5.0–8.0)

## 2018-06-26 LAB — CBC
HCT: 27.9 % — ABNORMAL LOW (ref 36.0–46.0)
Hemoglobin: 8.6 g/dL — ABNORMAL LOW (ref 12.0–15.0)
MCH: 26.6 pg (ref 26.0–34.0)
MCHC: 30.8 g/dL (ref 30.0–36.0)
MCV: 86.4 fL (ref 80.0–100.0)
Platelets: 453 10*3/uL — ABNORMAL HIGH (ref 150–400)
RBC: 3.23 MIL/uL — ABNORMAL LOW (ref 3.87–5.11)
RDW: 13.5 % (ref 11.5–15.5)
WBC: 8.3 10*3/uL (ref 4.0–10.5)
nRBC: 0 % (ref 0.0–0.2)

## 2018-06-26 LAB — HEPARIN LEVEL (UNFRACTIONATED)
Heparin Unfractionated: 0.27 IU/mL — ABNORMAL LOW (ref 0.30–0.70)
Heparin Unfractionated: 0.56 IU/mL (ref 0.30–0.70)

## 2018-06-26 LAB — HEMOGLOBIN A1C
Hgb A1c MFr Bld: 11.1 % — ABNORMAL HIGH (ref 4.8–5.6)
Mean Plasma Glucose: 271.87 mg/dL

## 2018-06-26 LAB — PROTIME-INR
INR: 1.13
Prothrombin Time: 14.4 seconds (ref 11.4–15.2)

## 2018-06-26 LAB — GLUCOSE, CAPILLARY
Glucose-Capillary: 148 mg/dL — ABNORMAL HIGH (ref 70–99)
Glucose-Capillary: 94 mg/dL (ref 70–99)
Glucose-Capillary: 130 mg/dL — ABNORMAL HIGH (ref 70–99)
Glucose-Capillary: 133 mg/dL — ABNORMAL HIGH (ref 70–99)

## 2018-06-26 MED ORDER — DIAZEPAM 2 MG PO TABS
2.0000 mg | ORAL_TABLET | Freq: Once | ORAL | Status: AC
  Administered 2018-06-28: 2 mg via ORAL
  Filled 2018-06-26: qty 1

## 2018-06-26 MED ORDER — TEMAZEPAM 15 MG PO CAPS: 15.0000 mg | ORAL_CAPSULE | Freq: Once | ORAL | Status: DC | PRN

## 2018-06-26 MED ORDER — CHLORHEXIDINE GLUCONATE 0.12 % MT SOLN
15.0000 mL | Freq: Once | OROMUCOSAL | Status: AC
  Administered 2018-06-28: 15 mL via OROMUCOSAL
  Filled 2018-06-26: qty 15

## 2018-06-26 MED ORDER — POTASSIUM CHLORIDE CRYS ER 20 MEQ PO TBCR
20.0000 meq | EXTENDED_RELEASE_TABLET | Freq: Once | ORAL | Status: AC
  Administered 2018-06-26: 20 meq via ORAL
  Filled 2018-06-26: qty 1

## 2018-06-26 MED ORDER — CHLORHEXIDINE GLUCONATE CLOTH 2 % EX PADS: 6.0000 | MEDICATED_PAD | Freq: Once | CUTANEOUS | Status: DC

## 2018-06-26 MED ORDER — BISACODYL 5 MG PO TBEC
5.0000 mg | DELAYED_RELEASE_TABLET | Freq: Once | ORAL | Status: AC
  Administered 2018-06-28: 5 mg via ORAL
  Filled 2018-06-26: qty 1

## 2018-06-26 MED ORDER — LISINOPRIL 20 MG PO TABS: 20.0000 mg | ORAL_TABLET | Freq: Every day | ORAL | Status: DC

## 2018-06-26 MED ORDER — POTASSIUM CHLORIDE CRYS ER 20 MEQ PO TBCR
40.0000 meq | EXTENDED_RELEASE_TABLET | Freq: Two times a day (BID) | ORAL | Status: AC
  Administered 2018-06-26 – 2018-06-27 (×3): 40 meq via ORAL
  Filled 2018-06-26 (×3): qty 2

## 2018-06-26 NOTE — Progress Notes (Signed)
Time for heparin Indication: chest pain/ACS  Allergies  Allergen Reactions  . Lyrica [Pregabalin] Other (See Comments)    MADE PATIENT VERY EMOTIONAL AND WOULD CRY EASILY  . Morphine And Related Nausea And Vomiting  . Tramadol Nausea And Vomiting    Pt cant tolerate this pain med.     Patient Measurements: Height: 5\' 2"  (157.5 cm) Weight: 171 lb (77.6 kg) IBW/kg (Calculated) : 50.1 Heparin Dosing Weight: 68kg  Vital Signs: Temp: 98.5 F (36.9 C) (10/19 0516) Temp Source: Oral (10/19 0516) BP: 132/82 (10/19 0516) Pulse Rate: 64 (10/19 0516)  Labs: Recent Labs    06/24/18 0454 06/24/18 3903 06/24/18 0928 06/24/18 1511 06/25/18 0357 06/25/18 0728 06/25/18 1523 06/26/18 0502  HGB 8.3*  --   --   --  8.4*  --   --  8.6*  HCT 27.2*  --   --   --  26.7*  --   --  27.9*  PLT 484*  --   --   --  459*  --   --  453*  LABPROT  --   --   --  15.7*  --   --   --  14.4  INR  --   --   --  1.26  --   --   --  1.13  HEPARINUNFRC  --   --   --   --   --  <0.10* <0.10* 0.27*  CREATININE  --   --  1.08*  --  0.92  --   --  1.23*  TROPONINI  --  0.64* 0.60* 0.49*  --   --   --   --     Estimated Creatinine Clearance: 50.4 mL/min (A) (by C-G formula based on SCr of 1.23 mg/dL (H)).   Medical History: Past Medical History:  Diagnosis Date  . Acute on chronic diastolic CHF (congestive heart failure) ([place]) 06/24/2018  . Anxiety   . COMMON MIGRAINE 10/07/2010  . Decreased visual acuity 11/10/2016  . Depression 12/18/2012  . Diabetes mellitus type II, uncontrolled ([place]) 10/07/2010   Qualifier: Diagnosis of  By: Charlett Blake MD, [name]    . Diabetic foot infection ([place]) 08/26/2016  . Disturbance of skin sensation 10/07/2010  . Heart murmur   . History of kidney stones    "years ago"  . Hyperlipidemia 12/06/2010  . Hypertension   . Lipoma of abdominal wall 10/05/2016  . Overweight(278.02) 12/06/2010  . PERIPHERAL NEUROPATHY, FEET 10/07/2010  . PVD  (peripheral vascular disease) ([place]) 01/21/2012  . RESTLESS LEG SYNDROME 10/25/2010  . Stroke [facility]) 2014, 2017   most recently in 2/17 - intracerebral hemorrhage    Assessment: 76 yoF admitted with CP and SOB to start on IV heparin prior to cardiac cath for ACS r/o.   Heparin level 0.27 units/ml  Goal of Therapy:  Heparin level 0.3-0.7 units/ml Monitor platelets by anticoagulation protocol: Yes   Plan:  -Increase heparin infusion to 1400 units/hr -Recheck heparin level in 6 hours  Thanks for allowing pharmacy to be a part of this patient's care.  Excell Seltzer, PharmD Clinical Pharmacist 06/26/2018

## 2018-06-26 NOTE — Progress Notes (Signed)
[place] for heparin Indication: chest pain/ACS  Allergies  Allergen Reactions  . Lyrica [Pregabalin] Other (See Comments)    MADE PATIENT VERY EMOTIONAL AND WOULD CRY EASILY  . Morphine And Related Nausea And Vomiting  . Tramadol Nausea And Vomiting    Pt cant tolerate this pain med.     Patient Measurements: Height: 5\' 2"  (157.5 cm) Weight: 171 lb (77.6 kg) IBW/kg (Calculated) : 50.1 Heparin Dosing Weight: 68kg  Vital Signs: Temp: 98.5 F (36.9 C) (10/19 0516) Temp Source: Oral (10/19 0516) BP: 128/73 (10/19 0905) Pulse Rate: 62 (10/19 0905)  Labs: Recent Labs    06/24/18 0454 06/24/18 0808 06/24/18 0928 06/24/18 1511 06/25/18 0357  06/25/18 1523 06/26/18 0502 06/26/18 1248  HGB 8.3*  --   --   --  8.4*  --   --  8.6*  --   HCT 27.2*  --   --   --  26.7*  --   --  27.9*  --   PLT 484*  --   --   --  459*  --   --  453*  --   LABPROT  --   --   --  15.7*  --   --   --  14.4  --   INR  --   --   --  1.26  --   --   --  1.13  --   HEPARINUNFRC  --   --   --   --   --    < > <0.10* 0.27* 0.56  CREATININE  --   --  1.08*  --  0.92  --   --  1.23*  --   TROPONINI  --  0.64* 0.60* 0.49*  --   --   --   --   --    < > = values in this interval not displayed.    Estimated Creatinine Clearance: 50.4 mL/min (A) (by C-G formula based on SCr of 1.23 mg/dL (H)).   Medical History: Past Medical History:  Diagnosis Date  . Acute on chronic diastolic CHF (congestive heart failure) ([place]) 06/24/2018  . Anxiety   . COMMON MIGRAINE 10/07/2010  . Decreased visual acuity 11/10/2016  . Depression 12/18/2012  . Diabetes mellitus type II, uncontrolled ([place]) 10/07/2010   Qualifier: Diagnosis of  By: Charlett Blake MD, [name]    . Diabetic foot infection ([place]) 08/26/2016  . Disturbance of skin sensation 10/07/2010  . Heart murmur   . History of kidney stones    "years ago"  . Hyperlipidemia 12/06/2010  . Hypertension   . Lipoma of abdominal wall  10/05/2016  . Overweight(278.02) 12/06/2010  . PERIPHERAL NEUROPATHY, FEET 10/07/2010  . PVD (peripheral vascular disease) ([place]) 01/21/2012  . RESTLESS LEG SYNDROME 10/25/2010  . Stroke [facility]) 2014, 2017   most recently in 2/17 - intracerebral hemorrhage    Assessment: 22 yoF admitted with CP and SOB to start on IV heparin now awaiting CABG on Monday.    Heparin level this afternoon came back therapeutic at 0.56, on 1400 units/hr. Hgb 8.6 (low but stable), plt 453. No s/sx of bleeding. No infusion issues.   Goal of Therapy:  Heparin level 0.3-0.7 units/ml Monitor platelets by anticoagulation protocol: Yes   Plan:  -Continue heparin infusion at 1400 units/hr -Monitor daily HL, CBC, and for s/sx of bleeding  Thanks for allowing pharmacy to be a part of this patient's care.  Doylene Canard, PharmD Clinical Pharmacist  Pager: [phone] Phone: [phone] 06/26/2018

## 2018-06-26 NOTE — Progress Notes (Signed)
Progress Note  Patient Name: [name] Date of Encounter: 06/26/2018  Primary Cardiologist: Elouise Munroe, MD   Subjective   Nervous about surgery but otherwise doing well.  Inpatient Medications    Scheduled Meds: . aspirin EC  81 mg Oral Daily  . atorvastatin  80 mg Oral q1800  . bisacodyl  5 mg Oral Once  . carvedilol  6.25 mg Oral BID WC  . [START ON 06/27/2018] chlorhexidine  15 mL Mouth/Throat Once  . Chlorhexidine Gluconate Cloth  6 each Topical Once   And  . Chlorhexidine Gluconate Cloth  6 each Topical Once  . [START ON 06/27/2018] diazepam  2 mg Oral Once  . escitalopram  10 mg Oral Daily  . ferrous gluconate  324 mg Oral Q breakfast  . gabapentin  300 mg Oral TID  . insulin aspart  0-9 Units Subcutaneous TID WC  . insulin glargine  50 Units Subcutaneous Daily  . levothyroxine  25 mcg Oral QAC breakfast  . [START ON 06/27/2018] lisinopril  20 mg Oral Daily  . mupirocin ointment   Topical Daily  . potassium chloride  40 mEq Oral BID  . sodium chloride flush  3 mL Intravenous Q12H   Continuous Infusions: . sodium chloride    . heparin 1,400 Units/hr (06/26/18 0830)   PRN Meds: sodium chloride, acetaminophen **OR** acetaminophen, diazepam, fluticasone, nitroGLYCERIN, ondansetron **OR** ondansetron (ZOFRAN) IV, sodium chloride flush, temazepam, tiZANidine   Vital Signs    Vitals:   06/25/18 1638 06/25/18 2203 06/26/18 0516 06/26/18 0905  BP: (!) 105/57 (!) 145/80 132/82 128/73  Pulse: 62 75 64 62  Resp:  17 14   Temp:   98.5 F (36.9 C)   TempSrc:  Oral Oral   SpO2:  99% 95%   Weight:   77.6 kg   Height:        Intake/Output Summary (Last 24 hours) at 06/26/2018 1146 Last data filed at 06/26/2018 0634 Gross per 24 hour  Intake 243 ml  Output 2200 ml  Net -1957 ml   Filed Weights   06/24/18 0233 06/26/18 0516  Weight: 79.7 kg 77.6 kg    Telemetry    SR - Personally Reviewed  ECG    No new - Personally Reviewed  Physical  Exam  Constitutional: No acute distress Eyes: pupils equally round and reactive to light, sclera non-icteric, normal conjunctiva and lids ENMT: normal dentition, moist mucous membranes Cardiovascular: regular rhythm, normal rate, no murmurs. S1 and S2 normal. Radial pulses normal bilaterally. No jugular venous distention.  Respiratory: clear to auscultation bilaterally GI : normal bowel sounds, soft and nontender. No distention.   MSK: extremities warm, well perfused. No edema.  NEURO: grossly nonfocal exam, moves all extremities. PSYCH: alert and oriented x 3, normal mood and affect.   Labs    Chemistry Recent Labs  Lab 06/23/18 2116 06/24/18 0928 06/25/18 0357 06/26/18 0502  NA 140 142 141 139  K 3.3* 3.5 3.2* 3.3*  CL 103 108 108 102  CO2 24 23 24 29   GLUCOSE 137* 129* 48* 160*  BUN 18 19 16  24*  CREATININE 1.01* 1.08* 0.92 1.23*  CALCIUM 8.8* 8.5* 8.5* 8.6*  PROT 7.1 6.9  --   --   ALBUMIN 2.6* 2.6*  --   --   AST 17 22  --   --   ALT 21 20  --   --   ALKPHOS 98 87  --   --   BILITOT  0.5 0.6  --   --   GFRNONAA >60 57* >60 49*  GFRAA >60 >60 >60 57*  ANIONGAP 13 11 9 8      Hematology Recent Labs  Lab 06/24/18 0454 06/25/18 0357 06/26/18 0502  WBC 7.2 8.3 8.3  RBC 3.13*  3.13* 3.13* 3.23*  HGB 8.3* 8.4* 8.6*  HCT 27.2* 26.7* 27.9*  MCV 86.9 85.3 86.4  MCH 26.5 26.8 26.6  MCHC 30.5 31.5 30.8  RDW 13.5 13.6 13.5  PLT 484* 459* 453*    Cardiac Enzymes Recent Labs  Lab 06/24/18 0808 06/24/18 0928 06/24/18 1511  TROPONINI 0.64* 0.60* 0.49*    Recent Labs  Lab 06/23/18 2252  TROPIPOC 0.40*     BNP Recent Labs  Lab 06/23/18 2116  BNP 335.2*     DDimer No results for input(s): DDIMER in the last 168 hours.   Radiology    No results found.  Cardiac Studies   No new  Assessment & Plan   Principal Problem:   Non-ST elevation (NSTEMI) myocardial infarction [facility]) Active Problems:   Essential hypertension, benign   Anemia of chronic  disease   Chest pain   Type 2 diabetes mellitus with vascular disease (HCC)   Acute on chronic diastolic CHF (congestive heart failure) ([place])   Coronary artery disease involving native coronary artery of native heart with angina pectoris ([place])   Plan for CABG on Monday. Continue heparin infusion. Consider nitro for chest pain.  With slight rise in creatinine, and approaching euvolemia by exam and patient report, I have made the following changes:  Held evening lisinopril, will redose in the AM as needed.  Discontinued IV lasix, will redose tomorrow as needed.  For hypokalemia, I have increased her dose of potassium for this morning for a total of 60 mEq and will continue 40 mEq BID orally.     For questions or updates, please contact [place] Please consult [email or web address] for contact info under       Signed, Elouise Munroe, MD  06/26/2018, 11:46 AM

## 2018-06-26 NOTE — Progress Notes (Addendum)
PROGRESS NOTE  [name]  KPT:[id number] DOB: March 06, 1966 DOA: 06/23/2018 PCP: Mosie Lukes, MD   Brief Narrative: [name] is a 52 y.o. female with a history of CVA, T2DM, PVD, chronic HFpEF, and depression with recent bereavement from her husband's death last month who presented to the ED due to chest pain and progressive exertional dyspnea for the past week. Chest pain is pressure across central/left chest that lasted about 5 minutes and abated spontaneously. She also reported some increased leg swelling, though this is waxing/waning chronically. She had unclear injury to the right 2nd toe and has a wound that does not appear infected, ABI checked and bilaterally normal. Work up in ED demonstrated inferior ST-T changes that are new and LVH with troponin elevated at 0.4. CXR demonstrated CHF pattern and BNP elevated to 335. Cardiology was consulted, troponins rose to 0.6, wall motion abnormalities were seen on echocardiogram and the patient was taken to cardiac catheterization 10/17 which revealed proximal left main and LAD disease as well as distal RCA stenosis. Cardiothoracic surgery was consulted and after discussion with the patient, recommend CABG 10/21. She is maintained on heparin gtt.  Assessment & Plan: Principal Problem:   Non-ST elevation (NSTEMI) myocardial infarction [facility]) Active Problems:   Essential hypertension, benign   Anemia of chronic disease   Chest pain   Type 2 diabetes mellitus with vascular disease (HCC)   Acute on chronic diastolic CHF (congestive heart failure) (HCC)   Coronary artery disease involving native coronary artery of native heart with angina pectoris (HCC)  NSTEMI, severe diffuse CAD:  - CABG recommended, planned next week.  - Continue heparin, ASA, no plavix - High-intensity statin, BB  Acute on chronic combined HFrEF: EF by cath slightly reduced, G2DD on echo.  - Creatinine bumped, hold lasix.  - Continue BB, ACE inhibitor - Daily  weights, strict I/O, heart healthy diet  IDT2DM with insensate peripheral neuropathy: Poorly controlled with hyperglycemia and HbA1c 11.1% (worsened from prior). Significant insulin use at home but not entirely clear what she takes. Prescribed 100u qAM and 50u qPM, pt usually takes 50u and 50u depending on blood sugar checks.  - Continue lantus 50 units qAM. Fasting CBG 148, on target and remains at inpatient goal. Continue SSI.  Right toe wound: Needs very close monitoring in pt with history of PVD and former toe amputation. ABI normal bilaterally - Local wound care, tight glucose control  Iron deficiency anemia: Hgb 9.7 > 8.3 and seems to have stabilized. - Iron and % sat low, ferritin on low end of normal. Start po iron. No colonoscopy reports in EMR. - T&S in the event transfusion required. FOBT ordered.  History of CVA:  - ASA, statin   Hypokalemia:  - Augment replacement today and recheck in AM. Hopefully will not worsen since DCing lasix.  Grief and depression:  - Support provided - Continue SSRI, offered chaplain  Hypothyroidism: TSH 2.674 - Continue low dose synthroid  DVT prophylaxis: Heparin gtt Code Status: Full Family Communication: Multiple family members at bedside Disposition Plan: Uncertain, pending postoperative course.   Consultants:   Cardiology  Cardiothoracic surgery  Procedures:  CARDIAC CATH: 06/24/2018  Ost RPDA to RPDA lesion is 40% stenosed.  RPDA-1 lesion is 50% stenosed.  RPDA-2 lesion is 80% stenosed.  Prox RCA lesion is 20% stenosed.  Mid RCA lesion is 20% stenosed.  Mid LM to Dist LM lesion is 80% stenosed.  Dist LM to Prox LAD lesion is 85% stenosed.  Prox LAD to Mid LAD lesion is 80% stenosed.  Ost 2nd Diag to 2nd Diag lesion is 95% stenosed.  Mid LAD lesion is 95% stenosed.  Dist LAD lesion is 99% stenosed.  Prox Cx to Mid Cx lesion is 60% stenosed.  LV end diastolic pressure is severely elevated.  There is moderate  left ventricular systolic dysfunction.  Severe multivessel CAD with 80% distal left main stenosis, calcification of the proximal LAD with diffuse 85 to 90% stenosis before the first diagonal vessel, 80% stenoses between the first and second diagonal vessel with 95% ostial stenosis of the second diagonal and LAD beyond the diagonal vessel; the LAD is subtotally occluded in the distal LAD after the third diagonal vessel with faint filling antegrade; 60% proximal left circumflex stenosis with 50% distal circumflex marginal stenosis; large dominant RCA with 20% tandem mid RCA stenoses, and PDA stenosis of 40% ostially 50% in the mid segment and 80% distally.  Moderate LV dysfunction with an ejection fraction of 40 to 45% with hypocontractility involving the mid distal anterolateral wall extending to the apex. LVEDP is elevated at 29 mm.  RECOMMENDATION: Surgical consultation for CABG revascularization surgery. Increase anti-ischemic medical therapy. She will be started on high potency statin therapy. With high-grade disease consider heparinization but must follow hemoglobin closely with recent decreased hemoglobin.  Recommend Aspirin 81mg  daily for CAD and with need for CABG revascularization will not start P2Y12 inhibition.  ECHO:  06/24/2018 - Left ventricle: The cavity size was normal. Wall thickness was increased in a pattern of mild LVH. Systolic function was low normal to mildly reduced. The estimated ejection fraction was in the range of 50% to 55%. Anterior and anterolateral hypokinesis. Features are consistent with a pseudonormal left ventricular filling pattern, with concomitant abnormal relaxation and increased filling pressure (grade 2 diastolic dysfunction). - Aortic valve: There was no stenosis. - Mitral valve: There was no significant regurgitation. - Left atrium: The atrium was mildly dilated. - Right ventricle: The cavity size was normal. Systolic  function was normal. - Pulmonary arteries: No complete TR doppler jet so unable to estimate PA systolic pressure. - Systemic veins: IVC measured 1.8 cm with < 50% respirophasic variation, suggesting RA pressure 8 mmHg.  Impressions:  - Normal LV size with mild LV hypertrophy. EF 50-55%, anterior and anterolateral hypokinesis. Normal RV size and systolic function. No significant valvular abnormalities.  CABG planned 06/28/2018  Antimicrobials:  None   Subjective: Finding more peace with the need to have CABG. Denies dyspnea or chest pain or bleeding.   Objective: Vitals:   06/26/18 0516 06/26/18 0905 06/26/18 1435 06/26/18 1731  BP: 132/82 128/73 136/75 (!) 167/83  Pulse: 64 62 68 73  Resp: 14     Temp: 98.5 F (36.9 C)  98.1 F (36.7 C)   TempSrc: Oral  Oral   SpO2: 95%  100%   Weight: 77.6 kg     Height:        Intake/Output Summary (Last 24 hours) at 06/26/2018 1910 Last data filed at 06/26/2018 8295 Gross per 24 hour  Intake 243 ml  Output 1700 ml  Net -1457 ml   Filed Weights   06/24/18 0233 06/26/18 0516  Weight: 79.7 kg 77.6 kg   Gen: 52 y.o. female in no distress Pulm: Nonlabored breathing room air. Clear, no wheezes or crackles. CV: Regular rate and rhythm. No murmur, rub, or gallop. No JVD, no significant dependent edema. GI: Abdomen soft, non-tender, non-distended, with normoactive bowel sounds.  Ext:  Warm, no deformities Skin: No rashes, lesions or ulcers on visualized skin.  Neuro: Alert and oriented. No focal neurological deficits. Psych: Judgement and insight appear fair. Mood euthymic & affect congruent. Behavior is appropriate.    Data Reviewed: I have personally reviewed following labs and imaging studies  CBC: Recent Labs  Lab 06/23/18 2116 06/24/18 0454 06/25/18 0357 06/26/18 0502  WBC 11.1* 7.2 8.3 8.3  NEUTROABS 9.0*  --   --   --   HGB 9.7* 8.3* 8.4* 8.6*  HCT 32.4* 27.2* 26.7* 27.9*  MCV 86.9 86.9 85.3 86.4  PLT  570* 484* 459* 665*   Basic Metabolic Panel: Recent Labs  Lab 06/23/18 2116 06/24/18 0928 06/25/18 0357 06/26/18 0502  NA 140 142 141 139  K 3.3* 3.5 3.2* 3.3*  CL 103 108 108 102  CO2 24 23 24 29   GLUCOSE 137* 129* 48* 160*  BUN 18 19 16  24*  CREATININE 1.01* 1.08* 0.92 1.23*  CALCIUM 8.8* 8.5* 8.5* 8.6*  MG  --  1.8  --   --    GFR: Estimated Creatinine Clearance: 50.4 mL/min (A) (by C-G formula based on SCr of 1.23 mg/dL (H)). Liver Function Tests: Recent Labs  Lab 06/23/18 2116 06/24/18 0928  AST 17 22  ALT 21 20  ALKPHOS 98 87  BILITOT 0.5 0.6  PROT 7.1 6.9  ALBUMIN 2.6* 2.6*   No results for input(s): LIPASE, AMYLASE in the last 168 hours. No results for input(s): AMMONIA in the last 168 hours. Coagulation Profile: Recent Labs  Lab 06/24/18 1511 06/26/18 0502  INR 1.26 1.13   Cardiac Enzymes: Recent Labs  Lab 06/24/18 0808 06/24/18 0928 06/24/18 1511  TROPONINI 0.64* 0.60* 0.49*   BNP (last 3 results) No results for input(s): PROBNP in the last 8760 hours. HbA1C: Recent Labs    06/26/18 0502  HGBA1C 11.1*   CBG: Recent Labs  Lab 06/25/18 1605 06/25/18 2235 06/26/18 0732 06/26/18 1134 06/26/18 1621  GLUCAP 131* 155* 148* 94 130*   Lipid Profile: Recent Labs    06/24/18 1045  CHOL 200  HDL 36*  LDLCALC 139*  TRIG 127  CHOLHDL 5.6   Thyroid Function Tests: Recent Labs    06/24/18 0454  TSH 2.674   Anemia Panel: Recent Labs    06/24/18 0454  VITAMINB12 290  FOLATE 16.8  FERRITIN 65  TIBC 263  IRON 15*  RETICCTPCT 1.6   Urine analysis:    Component Value Date/Time   COLORURINE YELLOW 06/23/2018 2111   APPEARANCEUR HAZY (A) 06/23/2018 2111   LABSPEC 1.019 06/23/2018 2111   PHURINE 6.0 06/23/2018 2111   GLUCOSEU 150 (A) 06/23/2018 2111   GLUCOSEU NEGATIVE 05/21/2015 0831   HGBUR MODERATE (A) 06/23/2018 2111   BILIRUBINUR NEGATIVE 06/23/2018 2111   KETONESUR NEGATIVE 06/23/2018 2111   PROTEINUR >=300 (A)  06/23/2018 2111   UROBILINOGEN 0.2 05/21/2015 0831   NITRITE NEGATIVE 06/23/2018 2111   LEUKOCYTESUR NEGATIVE 06/23/2018 2111   Recent Results (from the past 240 hour(s))  Blood Culture (routine x 2)     Status: None (Preliminary result)   Collection Time: 06/23/18  8:45 PM  Result Value Ref Range Status   Specimen Description BLOOD RIGHT ARM  Final   Special Requests   Final    BOTTLES DRAWN AEROBIC AND ANAEROBIC Blood Culture results may not be optimal due to an excessive volume of blood received in culture bottles   Culture   Final    NO GROWTH 3 DAYS Performed  at [facility], [place] [address] [place], [place] [address]    Report Status PENDING  Incomplete  Blood Culture (routine x 2)     Status: None (Preliminary result)   Collection Time: 06/23/18  9:08 PM  Result Value Ref Range Status   Specimen Description BLOOD RIGHT ARM  Final   Special Requests   Final    BOTTLES DRAWN AEROBIC ONLY Blood Culture adequate volume   Culture   Final    NO GROWTH 3 DAYS Performed at [facility], [address], [place], [place] [address]    Report Status PENDING  Incomplete      Radiology Studies: No results found.  Scheduled Meds: . aspirin EC  81 mg Oral Daily  . atorvastatin  80 mg Oral q1800  . bisacodyl  5 mg Oral Once  . carvedilol  6.25 mg Oral BID WC  . [START ON 06/27/2018] chlorhexidine  15 mL Mouth/Throat Once  . Chlorhexidine Gluconate Cloth  6 each Topical Once   And  . Chlorhexidine Gluconate Cloth  6 each Topical Once  . [START ON 06/27/2018] diazepam  2 mg Oral Once  . escitalopram  10 mg Oral Daily  . ferrous gluconate  324 mg Oral Q breakfast  . gabapentin  300 mg Oral TID  . insulin aspart  0-9 Units Subcutaneous TID WC  . insulin glargine  50 Units Subcutaneous Daily  . levothyroxine  25 mcg Oral QAC breakfast  . [START ON 06/27/2018] lisinopril  20 mg Oral Daily  . mupirocin ointment   Topical Daily  . potassium chloride  40 mEq Oral BID  .  sodium chloride flush  3 mL Intravenous Q12H   Continuous Infusions: . sodium chloride    . heparin 1,400 Units/hr (06/26/18 0830)     LOS: 1 day   Time spent: 25 minutes.  Patrecia Pour, MD [facility] [email or web address] Password [email or web address] 06/26/2018, 7:10 PM

## 2018-06-26 NOTE — Progress Notes (Signed)
[id number] Reviewed cardiac surgery booklet with patient, given incentive spirometer instructed how to use. Set up pre open heart surgery video for the patient to see. Patient was given emotional support as she was tearful due to the recent loss of her husband. Will follow up with the patient post surgery.Barnet Pall, RN,BSN 06/26/2018 10:36 AM

## 2018-06-27 LAB — CBC
HCT: 26 % — ABNORMAL LOW (ref 36.0–46.0)
HCT: 27.2 % — ABNORMAL LOW (ref 36.0–46.0)
Hemoglobin: 8 g/dL — ABNORMAL LOW (ref 12.0–15.0)
Hemoglobin: 8.2 g/dL — ABNORMAL LOW (ref 12.0–15.0)
MCH: 26.8 pg (ref 26.0–34.0)
MCH: 26.2 pg (ref 26.0–34.0)
MCHC: 30.8 g/dL (ref 30.0–36.0)
MCHC: 30.1 g/dL (ref 30.0–36.0)
MCV: 87 fL (ref 80.0–100.0)
MCV: 86.9 fL (ref 80.0–100.0)
Platelets: 424 10*3/uL — ABNORMAL HIGH (ref 150–400)
Platelets: 451 10*3/uL — ABNORMAL HIGH (ref 150–400)
RBC: 2.99 MIL/uL — ABNORMAL LOW (ref 3.87–5.11)
RBC: 3.13 MIL/uL — ABNORMAL LOW (ref 3.87–5.11)
RDW: 13.5 % (ref 11.5–15.5)
RDW: 13.7 % (ref 11.5–15.5)
WBC: 9 10*3/uL (ref 4.0–10.5)
WBC: 8.5 10*3/uL (ref 4.0–10.5)
nRBC: 0 % (ref 0.0–0.2)
nRBC: 0 % (ref 0.0–0.2)

## 2018-06-27 LAB — BASIC METABOLIC PANEL
Anion gap: 7 (ref 5–15)
Anion gap: 7 (ref 5–15)
BUN: 29 mg/dL — ABNORMAL HIGH (ref 6–20)
BUN: 26 mg/dL — ABNORMAL HIGH (ref 6–20)
CO2: 27 mmol/L (ref 22–32)
CO2: 28 mmol/L (ref 22–32)
Calcium: 8.6 mg/dL — ABNORMAL LOW (ref 8.9–10.3)
Calcium: 8.6 mg/dL — ABNORMAL LOW (ref 8.9–10.3)
Chloride: 107 mmol/L (ref 98–111)
Chloride: 106 mmol/L (ref 98–111)
Creatinine, Ser: 1.29 mg/dL — ABNORMAL HIGH (ref 0.44–1.00)
Creatinine, Ser: 1.28 mg/dL — ABNORMAL HIGH (ref 0.44–1.00)
GFR calc Af Amer: 53 mL/min — ABNORMAL LOW (ref >60)
GFR calc Af Amer: 54 mL/min — ABNORMAL LOW (ref >60)
GFR calc non Af Amer: 46 mL/min — ABNORMAL LOW (ref >60)
GFR calc non Af Amer: 47 mL/min — ABNORMAL LOW (ref >60)
Glucose, Bld: 124 mg/dL — ABNORMAL HIGH (ref 70–99)
Glucose, Bld: 119 mg/dL — ABNORMAL HIGH (ref 70–99)
Potassium: 4.1 mmol/L (ref 3.5–5.1)
Potassium: 4.6 mmol/L (ref 3.5–5.1)
Sodium: 141 mmol/L (ref 135–145)
Sodium: 141 mmol/L (ref 135–145)

## 2018-06-27 LAB — MAGNESIUM: Magnesium: 2.1 mg/dL (ref 1.7–2.4)

## 2018-06-27 LAB — GLUCOSE, CAPILLARY
Glucose-Capillary: 91 mg/dL (ref 70–99)
Glucose-Capillary: 192 mg/dL — ABNORMAL HIGH (ref 70–99)
Glucose-Capillary: 76 mg/dL (ref 70–99)
Glucose-Capillary: 153 mg/dL — ABNORMAL HIGH (ref 70–99)

## 2018-06-27 LAB — SURGICAL PCR SCREEN
MRSA, PCR: NEGATIVE
Staphylococcus aureus: POSITIVE — AB

## 2018-06-27 LAB — HEPARIN LEVEL (UNFRACTIONATED): Heparin Unfractionated: 0.46 IU/mL (ref 0.30–0.70)

## 2018-06-27 MED ORDER — CHLORHEXIDINE GLUCONATE CLOTH 2 % EX PADS
6.0000 | MEDICATED_PAD | Freq: Every day | CUTANEOUS | Status: DC
  Administered 2018-06-27 – 2018-06-28 (×2): 6 via TOPICAL

## 2018-06-27 MED ORDER — PLASMA-LYTE 148 IV SOLN
INTRAVENOUS | Status: AC
  Administered 2018-06-28: 500 mL
  Filled 2018-06-27: qty 2.5

## 2018-06-27 MED ORDER — SODIUM CHLORIDE 0.9 % IV SOLN
INTRAVENOUS | Status: DC
  Filled 2018-06-27: qty 30

## 2018-06-27 MED ORDER — NOREPINEPHRINE 4 MG/250ML-% IV SOLN
0.0000 ug/min | INTRAVENOUS | Status: DC
  Filled 2018-06-27: qty 250

## 2018-06-27 MED ORDER — POTASSIUM CHLORIDE 2 MEQ/ML IV SOLN
80.0000 meq | INTRAVENOUS | Status: DC
  Filled 2018-06-27: qty 40

## 2018-06-27 MED ORDER — INSULIN GLARGINE 100 UNIT/ML ~~LOC~~ SOLN
30.0000 [IU] | Freq: Every day | SUBCUTANEOUS | Status: DC
  Filled 2018-06-27: qty 0.3

## 2018-06-27 MED ORDER — MAGNESIUM SULFATE 50 % IJ SOLN
40.0000 meq | INTRAMUSCULAR | Status: DC
  Filled 2018-06-27: qty 9.85

## 2018-06-27 MED ORDER — INSULIN REGULAR(HUMAN) IN NACL 100-0.9 UT/100ML-% IV SOLN
INTRAVENOUS | Status: DC
  Filled 2018-06-27: qty 100

## 2018-06-27 MED ORDER — MUPIROCIN 2 % EX OINT
1.0000 "application " | TOPICAL_OINTMENT | Freq: Two times a day (BID) | CUTANEOUS | Status: DC
  Administered 2018-06-27: 1 via NASAL

## 2018-06-27 MED ORDER — NITROGLYCERIN IN D5W 200-5 MCG/ML-% IV SOLN
2.0000 ug/min | INTRAVENOUS | Status: DC
  Filled 2018-06-27: qty 250

## 2018-06-27 MED ORDER — EPINEPHRINE PF 1 MG/ML IJ SOLN
0.0000 ug/min | INTRAVENOUS | Status: DC
  Filled 2018-06-27: qty 4

## 2018-06-27 MED ORDER — MILRINONE LACTATE IN DEXTROSE 20-5 MG/100ML-% IV SOLN
0.3000 ug/kg/min | INTRAVENOUS | Status: DC
  Filled 2018-06-27: qty 100

## 2018-06-27 MED ORDER — DEXMEDETOMIDINE HCL IN NACL 400 MCG/100ML IV SOLN
0.1000 ug/kg/h | INTRAVENOUS | Status: DC
  Filled 2018-06-27: qty 100

## 2018-06-27 MED ORDER — DOPAMINE-DEXTROSE 3.2-5 MG/ML-% IV SOLN
0.0000 ug/kg/min | INTRAVENOUS | Status: AC
  Administered 2018-06-28: 3 ug/kg/min via INTRAVENOUS
  Filled 2018-06-27: qty 250

## 2018-06-27 MED ORDER — VANCOMYCIN HCL 10 G IV SOLR
1250.0000 mg | INTRAVENOUS | Status: AC
  Administered 2018-06-28: 1250 mg via INTRAVENOUS
  Filled 2018-06-27: qty 1250

## 2018-06-27 MED ORDER — MUPIROCIN 2 % EX OINT
1.0000 "application " | TOPICAL_OINTMENT | Freq: Two times a day (BID) | CUTANEOUS | Status: AC
  Administered 2018-06-27 – 2018-07-02 (×10): 1 via NASAL
  Filled 2018-06-27: qty 22

## 2018-06-27 MED ORDER — SODIUM CHLORIDE 0.9 % IV SOLN
750.0000 mg | INTRAVENOUS | Status: DC
  Filled 2018-06-27: qty 750

## 2018-06-27 MED ORDER — TRANEXAMIC ACID (OHS) PUMP PRIME SOLUTION
2.0000 mg/kg | INTRAVENOUS | Status: DC
  Filled 2018-06-27: qty 1.54

## 2018-06-27 MED ORDER — PHENYLEPHRINE HCL-NACL 20-0.9 MG/250ML-% IV SOLN
30.0000 ug/min | INTRAVENOUS | Status: DC
  Filled 2018-06-27: qty 250

## 2018-06-27 MED ORDER — SODIUM CHLORIDE 0.9 % IV SOLN
1.5000 g | INTRAVENOUS | Status: DC
  Filled 2018-06-27: qty 1.5

## 2018-06-27 MED ORDER — TRANEXAMIC ACID (OHS) BOLUS VIA INFUSION
15.0000 mg/kg | INTRAVENOUS | Status: AC
  Administered 2018-06-28: 1156.5 mg via INTRAVENOUS
  Filled 2018-06-27: qty 1157

## 2018-06-27 MED ORDER — TRANEXAMIC ACID 1000 MG/10ML IV SOLN
1.5000 mg/kg/h | INTRAVENOUS | Status: DC
  Filled 2018-06-27: qty 25

## 2018-06-27 NOTE — Progress Notes (Signed)
Progress Note  Patient Name: [name] Date of Encounter: 06/27/2018  Primary Cardiologist: Elouise Munroe, MD   Subjective   Nervous about surgery but otherwise doing well.  Inpatient Medications    Scheduled Meds: . aspirin EC  81 mg Oral Daily  . atorvastatin  80 mg Oral q1800  . bisacodyl  5 mg Oral Once  . carvedilol  6.25 mg Oral BID WC  . chlorhexidine  15 mL Mouth/Throat Once  . Chlorhexidine Gluconate Cloth  6 each Topical Once   And  . Chlorhexidine Gluconate Cloth  6 each Topical Once  . diazepam  2 mg Oral Once  . escitalopram  10 mg Oral Daily  . ferrous gluconate  324 mg Oral Q breakfast  . gabapentin  300 mg Oral TID  . [START ON 06/28/2018] heparin-papaverine-plasmalyte irrigation   Irrigation To OR  . insulin aspart  0-9 Units Subcutaneous TID WC  . insulin glargine  50 Units Subcutaneous Daily  . [START ON 06/28/2018] insulin   Intravenous To OR  . levothyroxine  25 mcg Oral QAC breakfast  . [START ON 06/28/2018] magnesium sulfate  40 mEq Other To OR  . mupirocin ointment   Topical Daily  . [START ON 06/28/2018] phenylephrine  30-200 mcg/min Intravenous To OR  . [START ON 06/28/2018] potassium chloride  80 mEq Other To OR  . potassium chloride  40 mEq Oral BID  . sodium chloride flush  3 mL Intravenous Q12H  . [START ON 06/28/2018] tranexamic acid  15 mg/kg Intravenous To OR  . [START ON 06/28/2018] tranexamic acid  2 mg/kg Intracatheter To OR   Continuous Infusions: . sodium chloride    . [START ON 06/28/2018] cefUROXime (ZINACEF)  IV    . [START ON 06/28/2018] cefUROXime (ZINACEF)  IV    . [START ON 06/28/2018] dexmedetomidine    . [START ON 06/28/2018] DOPamine    . [START ON 06/28/2018] epinephrine    . [START ON 06/28/2018] heparin 30,000 units/NS 1000 mL solution for CELLSAVER    . heparin 1,400 Units/hr (06/27/18 0700)  . [START ON 06/28/2018] milrinone    . [START ON 06/28/2018] nitroGLYCERIN    . [START ON 06/28/2018]  norepinephrine (LEVOPHED) Adult infusion    . [START ON 06/28/2018] tranexamic acid (CYKLOKAPRON) infusion ([facility])    . [START ON 06/28/2018] vancomycin     PRN Meds: sodium chloride, acetaminophen **OR** acetaminophen, diazepam, fluticasone, nitroGLYCERIN, ondansetron **OR** ondansetron (ZOFRAN) IV, sodium chloride flush, temazepam, tiZANidine   Vital Signs    Vitals:   06/26/18 1731 06/26/18 2032 06/27/18 0508 06/27/18 0841  BP: (!) 167/83 110/67 122/66   Pulse: 73 68 71 71  Resp:  14 14   Temp:  98 F (36.7 C) 98.4 F (36.9 C)   TempSrc:  Oral Oral   SpO2:  100% 93%   Weight:   77.1 kg   Height:        Intake/Output Summary (Last 24 hours) at 06/27/2018 0903 Last data filed at 06/27/2018 0700 Gross per 24 hour  Intake 394 ml  Output -  Net 394 ml   Filed Weights   06/24/18 0233 06/26/18 0516 06/27/18 0508  Weight: 79.7 kg 77.6 kg 77.1 kg    Telemetry    SR - Personally Reviewed  ECG    No new - Personally Reviewed  Physical Exam  Constitutional: No acute distress Eyes: pupils equally round and reactive to light, sclera non-icteric, normal conjunctiva and lids ENMT: normal dentition, moist  mucous membranes Cardiovascular: regular rhythm, normal rate, no murmurs. S1 and S2 normal. Radial pulses normal bilaterally. No jugular venous distention.  Respiratory: clear to auscultation bilaterally GI : normal bowel sounds, soft and nontender. No distention.   MSK: extremities warm, well perfused. No edema.  NEURO: grossly nonfocal exam, moves all extremities. PSYCH: alert and oriented x 3, normal mood and affect.   Labs    Chemistry Recent Labs  Lab 06/23/18 2116 06/24/18 0928  06/26/18 0502 06/27/18 0015 06/27/18 0437  NA 140 142   < > 139 141 141  K 3.3* 3.5   < > 3.3* 4.1 4.6  CL 103 108   < > 102 107 106  CO2 24 23   < > 29 27 28   GLUCOSE 137* 129*   < > 160* 124* 119*  BUN 18 19   < > 24* 29* 26*  CREATININE 1.01* 1.08*   < > 1.23* 1.29* 1.28*    CALCIUM 8.8* 8.5*   < > 8.6* 8.6* 8.6*  PROT 7.1 6.9  --   --   --   --   ALBUMIN 2.6* 2.6*  --   --   --   --   AST 17 22  --   --   --   --   ALT 21 20  --   --   --   --   ALKPHOS 98 87  --   --   --   --   BILITOT 0.5 0.6  --   --   --   --   GFRNONAA >60 57*   < > 49* 46* 47*  GFRAA >60 >60   < > 57* 53* 54*  ANIONGAP 13 11   < > 8 7 7    < > = values in this interval not displayed.     Hematology Recent Labs  Lab 06/26/18 0502 06/27/18 0015 06/27/18 0437  WBC 8.3 9.0 8.5  RBC 3.23* 2.99* 3.13*  HGB 8.6* 8.0* 8.2*  HCT 27.9* 26.0* 27.2*  MCV 86.4 87.0 86.9  MCH 26.6 26.8 26.2  MCHC 30.8 30.8 30.1  RDW 13.5 13.5 13.7  PLT 453* 424* 451*    Cardiac Enzymes Recent Labs  Lab 06/24/18 0808 06/24/18 0928 06/24/18 1511  TROPONINI 0.64* 0.60* 0.49*    Recent Labs  Lab 06/23/18 2252  TROPIPOC 0.40*     BNP Recent Labs  Lab 06/23/18 2116  BNP 335.2*     DDimer No results for input(s): DDIMER in the last 168 hours.   Radiology    No results found.  Cardiac Studies   No new  Assessment & Plan   Principal Problem:   Non-ST elevation (NSTEMI) myocardial infarction [facility]) Active Problems:   Essential hypertension, benign   Anemia of chronic disease   Chest pain   Type 2 diabetes mellitus with vascular disease (HCC)   Acute on chronic diastolic CHF (congestive heart failure) ([place])   Coronary artery disease involving native coronary artery of native heart with angina pectoris ([place])   Plan for CABG on Monday. Continue heparin infusion. Consider nitro for chest pain.  With slight rise in creatinine, and approaching euvolemia by exam and patient report, I have made the following changes:  Discontinued lisinopril prior to surgery  Holding lasix due to euvolemia.    For questions or updates, please contact [place] Please consult [email or web address] for contact info under       Signed, Elouise Munroe, MD  06/27/2018, 9:03 AM

## 2018-06-27 NOTE — Progress Notes (Signed)
[place] for heparin Indication: chest pain/ACS  Allergies  Allergen Reactions  . Lyrica [Pregabalin] Other (See Comments)    MADE PATIENT VERY EMOTIONAL AND WOULD CRY EASILY  . Morphine And Related Nausea And Vomiting  . Tramadol Nausea And Vomiting    Pt cant tolerate this pain med.     Patient Measurements: Height: 5\' 2"  (157.5 cm) Weight: 169 lb 14.4 oz (77.1 kg) IBW/kg (Calculated) : 50.1 Heparin Dosing Weight: 68kg  Vital Signs: Temp: 98.4 F (36.9 C) (10/20 0508) Temp Source: Oral (10/20 0508) BP: 122/66 (10/20 0508) Pulse Rate: 71 (10/20 0841)  Labs: Recent Labs    06/24/18 1511  06/26/18 0502 06/26/18 1248 06/27/18 0015 06/27/18 0437  HGB  --    < > 8.6*  --  8.0* 8.2*  HCT  --    < > 27.9*  --  26.0* 27.2*  PLT  --    < > 453*  --  424* 451*  LABPROT 15.7*  --  14.4  --   --   --   INR 1.26  --  1.13  --   --   --   HEPARINUNFRC  --    < > 0.27* 0.56  --  0.46  CREATININE  --    < > 1.23*  --  1.29* 1.28*  TROPONINI 0.49*  --   --   --   --   --    < > = values in this interval not displayed.    Estimated Creatinine Clearance: 48.3 mL/min (A) (by C-G formula based on SCr of 1.28 mg/dL (H)).   Medical History: Past Medical History:  Diagnosis Date  . Acute on chronic diastolic CHF (congestive heart failure) ([place]) 06/24/2018  . Anxiety   . COMMON MIGRAINE 10/07/2010  . Decreased visual acuity 11/10/2016  . Depression 12/18/2012  . Diabetes mellitus type II, uncontrolled ([place]) 10/07/2010   Qualifier: Diagnosis of  By: Charlett Blake MD, [name]    . Diabetic foot infection ([place]) 08/26/2016  . Disturbance of skin sensation 10/07/2010  . Heart murmur   . History of kidney stones    "years ago"  . Hyperlipidemia 12/06/2010  . Hypertension   . Lipoma of abdominal wall 10/05/2016  . Overweight(278.02) 12/06/2010  . PERIPHERAL NEUROPATHY, FEET 10/07/2010  . PVD (peripheral vascular disease) ([place]) 01/21/2012  . RESTLESS LEG  SYNDROME 10/25/2010  . Stroke [facility]) 2014, 2017   most recently in 2/17 - intracerebral hemorrhage    Assessment: 58 yoF admitted with CP and SOB to start on IV heparin now awaiting CABG on Monday.    Heparin level this afternoon came back therapeutic at 0.46, on 1400 units/hr. Hgb 8.2 (low), plt 451. No s/sx of bleeding. No infusion issues.   Goal of Therapy:  Heparin level 0.3-0.7 units/ml Monitor platelets by anticoagulation protocol: Yes   Plan:  -Continue heparin infusion at 1400 units/hr -Monitor daily HL, CBC, and for s/sx of bleeding -Plan for CABG on 10/21  Thanks for allowing pharmacy to be a part of this patient's care.  Doylene Canard, PharmD Clinical Pharmacist  Pager: [phone] Phone: [phone] 06/27/2018

## 2018-06-27 NOTE — Progress Notes (Signed)
PROGRESS NOTE  [name]  GBT:[id number] DOB: [birth year] DOA: 06/23/2018 PCP: Mosie Lukes, MD   Brief Narrative: [name] is a 52 y.o. female with a history of CVA, T2DM, PVD, chronic HFpEF, and depression with recent bereavement from her husband's death last month who presented to the ED due to chest pain and progressive exertional dyspnea for the past week. Chest pain is pressure across central/left chest that lasted about 5 minutes and abated spontaneously. She also reported some increased leg swelling, though this is waxing/waning chronically. She had unclear injury to the right 2nd toe and has a wound that does not appear infected, ABI checked and bilaterally normal. Work up in ED demonstrated inferior ST-T changes that are new and LVH with troponin elevated at 0.4. CXR demonstrated CHF pattern and BNP elevated to 335. Cardiology was consulted, troponins rose to 0.6, wall motion abnormalities were seen on echocardiogram and the patient was taken to cardiac catheterization 10/17 which revealed proximal left main and LAD disease as well as distal RCA stenosis. Cardiothoracic surgery was consulted and after discussion with the patient, recommend CABG 10/21. She is maintained on heparin gtt.  Assessment & Plan: Principal Problem:   Non-ST elevation (NSTEMI) myocardial infarction [facility]) Active Problems:   Essential hypertension, benign   Anemia of chronic disease   Chest pain   Type 2 diabetes mellitus with vascular disease (HCC)   Acute on chronic diastolic CHF (congestive heart failure) (HCC)   Coronary artery disease involving native coronary artery of native heart with angina pectoris (HCC)  NSTEMI, severe diffuse CAD: Plan CABG 10/21. - Continue heparin, ASA, no plavix - High-intensity statin, BB  Acute on chronic combined HFrEF: EF by cath slightly reduced, G2DD on echo.  - Initial diuresis improved symptoms, but renal function impaired. Will stop ACE inhibitor for  now preop.  - Continue BB  - Daily weights, strict I/O, heart healthy diet, NPO p MN  IDT2DM with insensate peripheral neuropathy: Poorly controlled with hyperglycemia and HbA1c 11.1% (worsened from prior). Significant insulin use at home but not entirely clear what she takes. Prescribed 100u qAM and 50u qPM, pt usually takes 50u and 50u depending on blood sugar checks.  - Fasting CBG 91 this morning, will decrease lantus to 30 units tomorrow morning with anticipation of NPO status. Has had minimal SSI requirements. If CBG trends up through day would cover with SSI tomorrow.   Right toe wound: Needs very close monitoring in pt with history of PVD and former toe amputation. ABI normal bilaterally - Local wound care, tight glucose control  Iron deficiency anemia: Hgb 9.7 > 8.3 and seems to have stabilized. - Iron and % sat low, ferritin on low end of normal. Start po iron. No colonoscopy reports in EMR. - T&S in the event transfusion required. FOBT ordered.  History of CVA:  - ASA, statin   Hypokalemia:  - Augmented replacement with improvement to 4.6. Got another 40 this morning. Stopped lasix and stopping ACE. Recheck in AM  Grief and depression:  - Support provided - Continue SSRI, offered chaplain  Hypothyroidism: TSH 2.674 - Continue low dose synthroid  DVT prophylaxis: Heparin gtt Code Status: Full Family Communication: None at bedside this morning. Disposition Plan: Uncertain, pending postoperative course.   Consultants:   Cardiology  Cardiothoracic surgery  Procedures:  CARDIAC CATH: 06/24/2018  Ost RPDA to RPDA lesion is 40% stenosed.  RPDA-1 lesion is 50% stenosed.  RPDA-2 lesion is 80% stenosed.  Prox RCA lesion  is 20% stenosed.  Mid RCA lesion is 20% stenosed.  Mid LM to Dist LM lesion is 80% stenosed.  Dist LM to Prox LAD lesion is 85% stenosed.  Prox LAD to Mid LAD lesion is 80% stenosed.  Ost 2nd Diag to 2nd Diag lesion is 95% stenosed.  Mid LAD  lesion is 95% stenosed.  Dist LAD lesion is 99% stenosed.  Prox Cx to Mid Cx lesion is 60% stenosed.  LV end diastolic pressure is severely elevated.  There is moderate left ventricular systolic dysfunction.  Severe multivessel CAD with 80% distal left main stenosis, calcification of the proximal LAD with diffuse 85 to 90% stenosis before the first diagonal vessel, 80% stenoses between the first and second diagonal vessel with 95% ostial stenosis of the second diagonal and LAD beyond the diagonal vessel; the LAD is subtotally occluded in the distal LAD after the third diagonal vessel with faint filling antegrade; 60% proximal left circumflex stenosis with 50% distal circumflex marginal stenosis; large dominant RCA with 20% tandem mid RCA stenoses, and PDA stenosis of 40% ostially 50% in the mid segment and 80% distally.  Moderate LV dysfunction with an ejection fraction of 40 to 45% with hypocontractility involving the mid distal anterolateral wall extending to the apex. LVEDP is elevated at 29 mm.  RECOMMENDATION: Surgical consultation for CABG revascularization surgery. Increase anti-ischemic medical therapy. She will be started on high potency statin therapy. With high-grade disease consider heparinization but must follow hemoglobin closely with recent decreased hemoglobin.  Recommend Aspirin 81mg  daily for CAD and with need for CABG revascularization will not start P2Y12 inhibition.  ECHO:  06/24/2018 - Left ventricle: The cavity size was normal. Wall thickness was increased in a pattern of mild LVH. Systolic function was low normal to mildly reduced. The estimated ejection fraction was in the range of 50% to 55%. Anterior and anterolateral hypokinesis. Features are consistent with a pseudonormal left ventricular filling pattern, with concomitant abnormal relaxation and increased filling pressure (grade 2 diastolic dysfunction). - Aortic valve: There was no  stenosis. - Mitral valve: There was no significant regurgitation. - Left atrium: The atrium was mildly dilated. - Right ventricle: The cavity size was normal. Systolic function was normal. - Pulmonary arteries: No complete TR doppler jet so unable to estimate PA systolic pressure. - Systemic veins: IVC measured 1.8 cm with < 50% respirophasic variation, suggesting RA pressure 8 mmHg.  Impressions: - Normal LV size with mild LV hypertrophy. EF 50-55%, anterior and anterolateral hypokinesis. Normal RV size and systolic function. No significant valvular abnormalities.  CABG planned 06/28/2018  Antimicrobials:  None   Subjective: No chest pain or dyspnea. Urinating normally, no bleeding.  Objective: Vitals:   06/26/18 1731 06/26/18 2032 06/27/18 0508 06/27/18 0841  BP: (!) 167/83 110/67 122/66   Pulse: 73 68 71 71  Resp:  14 14   Temp:  98 F (36.7 C) 98.4 F (36.9 C)   TempSrc:  Oral Oral   SpO2:  100% 93%   Weight:   77.1 kg   Height:        Intake/Output Summary (Last 24 hours) at 06/27/2018 1357 Last data filed at 06/27/2018 0700 Gross per 24 hour  Intake 394 ml  Output -  Net 394 ml   Filed Weights   06/24/18 0233 06/26/18 0516 06/27/18 0508  Weight: 79.7 kg 77.6 kg 77.1 kg   Gen: 52 y.o. female in no distress Pulm: Nonlabored breathing room air. Clear. CV: Regular rate and rhythm. No  murmur, rub, or gallop. No JVD, no dependent edema. GI: Abdomen soft, non-tender, non-distended, with normoactive bowel sounds.  Ext: Warm, no deformities Skin: No rashes, lesions or ulcers on visualized skin.  Neuro: Alert and oriented. No focal neurological deficits. Psych: Judgement and insight appear fair. Mood somber & affect congruent. Behavior is appropriate.    Data Reviewed: I have personally reviewed following labs and imaging studies  CBC: Recent Labs  Lab 06/23/18 2116 06/24/18 0454 06/25/18 0357 06/26/18 0502 06/27/18 0015 06/27/18 0437  WBC  11.1* 7.2 8.3 8.3 9.0 8.5  NEUTROABS 9.0*  --   --   --   --   --   HGB 9.7* 8.3* 8.4* 8.6* 8.0* 8.2*  HCT 32.4* 27.2* 26.7* 27.9* 26.0* 27.2*  MCV 86.9 86.9 85.3 86.4 87.0 86.9  PLT 570* 484* 459* 453* 424* 244*   Basic Metabolic Panel: Recent Labs  Lab 06/24/18 0928 06/25/18 0357 06/26/18 0502 06/27/18 0015 06/27/18 0437  NA 142 141 139 141 141  K 3.5 3.2* 3.3* 4.1 4.6  CL 108 108 102 107 106  CO2 23 24 29 27 28   GLUCOSE 129* 48* 160* 124* 119*  BUN 19 16 24* 29* 26*  CREATININE 1.08* 0.92 1.23* 1.29* 1.28*  CALCIUM 8.5* 8.5* 8.6* 8.6* 8.6*  MG 1.8  --   --   --  2.1   GFR: Estimated Creatinine Clearance: 48.3 mL/min (A) (by C-G formula based on SCr of 1.28 mg/dL (H)). Liver Function Tests: Recent Labs  Lab 06/23/18 2116 06/24/18 0928  AST 17 22  ALT 21 20  ALKPHOS 98 87  BILITOT 0.5 0.6  PROT 7.1 6.9  ALBUMIN 2.6* 2.6*   No results for input(s): LIPASE, AMYLASE in the last 168 hours. No results for input(s): AMMONIA in the last 168 hours. Coagulation Profile: Recent Labs  Lab 06/24/18 1511 06/26/18 0502  INR 1.26 1.13   Cardiac Enzymes: Recent Labs  Lab 06/24/18 0808 06/24/18 0928 06/24/18 1511  TROPONINI 0.64* 0.60* 0.49*   BNP (last 3 results) No results for input(s): PROBNP in the last 8760 hours. HbA1C: Recent Labs    06/26/18 0502  HGBA1C 11.1*   CBG: Recent Labs  Lab 06/26/18 1134 06/26/18 1621 06/26/18 2130 06/27/18 0725 06/27/18 1132  GLUCAP 94 130* 133* 91 192*   Lipid Profile: No results for input(s): CHOL, HDL, LDLCALC, TRIG, CHOLHDL, LDLDIRECT in the last 72 hours. Thyroid Function Tests: No results for input(s): TSH, T4TOTAL, FREET4, T3FREE, THYROIDAB in the last 72 hours. Anemia Panel: No results for input(s): VITAMINB12, FOLATE, FERRITIN, TIBC, IRON, RETICCTPCT in the last 72 hours. Urine analysis:    Component Value Date/Time   COLORURINE YELLOW 06/26/2018 1937   APPEARANCEUR HAZY (A) 06/26/2018 1937   LABSPEC  1.014 06/26/2018 1937   PHURINE 5.0 06/26/2018 1937   GLUCOSEU NEGATIVE 06/26/2018 1937   GLUCOSEU NEGATIVE 05/21/2015 0831   HGBUR SMALL (A) 06/26/2018 1937   BILIRUBINUR NEGATIVE 06/26/2018 1937   KETONESUR NEGATIVE 06/26/2018 1937   PROTEINUR 100 (A) 06/26/2018 1937   UROBILINOGEN 0.2 05/21/2015 0831   NITRITE NEGATIVE 06/26/2018 1937   LEUKOCYTESUR LARGE (A) 06/26/2018 1937   Recent Results (from the past 240 hour(s))  Blood Culture (routine x 2)     Status: None (Preliminary result)   Collection Time: 06/23/18  8:45 PM  Result Value Ref Range Status   Specimen Description BLOOD RIGHT ARM  Final   Special Requests   Final    BOTTLES DRAWN AEROBIC AND ANAEROBIC  Blood Culture results may not be optimal due to an excessive volume of blood received in culture bottles   Culture   Final    NO GROWTH 4 DAYS Performed at [facility] [address], [place], [place] [address]    Report Status PENDING  Incomplete  Blood Culture (routine x 2)     Status: None (Preliminary result)   Collection Time: 06/23/18  9:08 PM  Result Value Ref Range Status   Specimen Description BLOOD RIGHT ARM  Final   Special Requests   Final    BOTTLES DRAWN AEROBIC ONLY Blood Culture adequate volume   Culture   Final    NO GROWTH 4 DAYS Performed at [facility], [address] [place], [place] [address]    Report Status PENDING  Incomplete      Radiology Studies: No results found.  Scheduled Meds: . aspirin EC  81 mg Oral Daily  . atorvastatin  80 mg Oral q1800  . bisacodyl  5 mg Oral Once  . carvedilol  6.25 mg Oral BID WC  . chlorhexidine  15 mL Mouth/Throat Once  . Chlorhexidine Gluconate Cloth  6 each Topical Once   And  . Chlorhexidine Gluconate Cloth  6 each Topical Once  . diazepam  2 mg Oral Once  . escitalopram  10 mg Oral Daily  . ferrous gluconate  324 mg Oral Q breakfast  . gabapentin  300 mg Oral TID  . [START ON 06/28/2018] heparin-papaverine-plasmalyte irrigation    Irrigation To OR  . insulin aspart  0-9 Units Subcutaneous TID WC  . insulin glargine  50 Units Subcutaneous Daily  . [START ON 06/28/2018] insulin   Intravenous To OR  . levothyroxine  25 mcg Oral QAC breakfast  . [START ON 06/28/2018] magnesium sulfate  40 mEq Other To OR  . mupirocin ointment  1 application Nasal BID  . mupirocin ointment   Topical Daily  . [START ON 06/28/2018] phenylephrine  30-200 mcg/min Intravenous To OR  . [START ON 06/28/2018] potassium chloride  80 mEq Other To OR  . potassium chloride  40 mEq Oral BID  . sodium chloride flush  3 mL Intravenous Q12H  . [START ON 06/28/2018] tranexamic acid  15 mg/kg Intravenous To OR  . [START ON 06/28/2018] tranexamic acid  2 mg/kg Intracatheter To OR   Continuous Infusions: . sodium chloride    . [START ON 06/28/2018] cefUROXime (ZINACEF)  IV    . [START ON 06/28/2018] cefUROXime (ZINACEF)  IV    . [START ON 06/28/2018] dexmedetomidine    . [START ON 06/28/2018] DOPamine    . [START ON 06/28/2018] epinephrine    . [START ON 06/28/2018] heparin 30,000 units/NS 1000 mL solution for CELLSAVER    . heparin 1,400 Units/hr (06/27/18 0700)  . [START ON 06/28/2018] milrinone    . [START ON 06/28/2018] nitroGLYCERIN    . [START ON 06/28/2018] norepinephrine (LEVOPHED) Adult infusion    . [START ON 06/28/2018] tranexamic acid (CYKLOKAPRON) infusion ([facility])    . [START ON 06/28/2018] vancomycin       LOS: 2 days   Time spent: 25 minutes.  Patrecia Pour, MD [facility] [email or web address] Password [email or web address] 06/27/2018, 1:57 PM

## 2018-06-28 ENCOUNTER — Inpatient Hospital Stay (HOSPITAL_COMMUNITY): Payer: BLUE CROSS/BLUE SHIELD

## 2018-06-28 ENCOUNTER — Encounter (HOSPITAL_COMMUNITY): Payer: Self-pay | Admitting: Orthopedic Surgery

## 2018-06-28 ENCOUNTER — Inpatient Hospital Stay (HOSPITAL_COMMUNITY): Payer: BLUE CROSS/BLUE SHIELD | Admitting: Certified Registered Nurse Anesthetist

## 2018-06-28 ENCOUNTER — Inpatient Hospital Stay (HOSPITAL_COMMUNITY)
Admission: EM | Disposition: A | Discharge status: Home Health | Payer: Self-pay | Source: Home / Self Care | Attending: Thoracic Surgery (Cardiothoracic Vascular Surgery)

## 2018-06-28 DIAGNOSIS — Z951 Presence of aortocoronary bypass graft: Secondary | ICD-10-CM

## 2018-06-28 HISTORY — PX: TEE WITHOUT CARDIOVERSION: SHX5443

## 2018-06-28 HISTORY — PX: CORONARY ARTERY BYPASS GRAFT: SHX141

## 2018-06-28 HISTORY — PX: RADIAL ARTERY HARVEST: SHX5067

## 2018-06-28 HISTORY — PX: ENDOVEIN HARVEST OF GREATER SAPHENOUS VEIN: SHX5059

## 2018-06-28 LAB — POCT I-STAT 3, ART BLOOD GAS (G3+)
Acid-Base Excess: 1 mmol/L (ref 0.0–2.0)
Bicarbonate: 26.1 mmol/L (ref 20.0–28.0)
Bicarbonate: 23.3 mmol/L (ref 20.0–28.0)
O2 Saturation: 100 %
O2 Saturation: 100 %
TCO2: 27 mmol/L (ref 22–32)
TCO2: 24 mmol/L (ref 22–32)
pCO2 arterial: 41.2 mmHg (ref 32.0–48.0)
pCO2 arterial: 32.3 mmHg (ref 32.0–48.0)
pH, Arterial: 7.409 (ref 7.350–7.450)
pH, Arterial: 7.466 — ABNORMAL HIGH (ref 7.350–7.450)
pO2, Arterial: 406 mmHg — ABNORMAL HIGH (ref 83.0–108.0)
pO2, Arterial: 216 mmHg — ABNORMAL HIGH (ref 83.0–108.0)

## 2018-06-28 LAB — POCT I-STAT, CHEM 8
BUN: 22 mg/dL — ABNORMAL HIGH (ref 6–20)
BUN: 21 mg/dL — ABNORMAL HIGH (ref 6–20)
BUN: 21 mg/dL — ABNORMAL HIGH (ref 6–20)
BUN: 20 mg/dL (ref 6–20)
BUN: 21 mg/dL — ABNORMAL HIGH (ref 6–20)
Calcium, Ion: 1.22 mmol/L (ref 1.15–1.40)
Calcium, Ion: 1.21 mmol/L (ref 1.15–1.40)
Calcium, Ion: 1.07 mmol/L — ABNORMAL LOW (ref 1.15–1.40)
Calcium, Ion: 1.08 mmol/L — ABNORMAL LOW (ref 1.15–1.40)
Calcium, Ion: 1.07 mmol/L — ABNORMAL LOW (ref 1.15–1.40)
Chloride: 105 mmol/L (ref 98–111)
Chloride: 106 mmol/L (ref 98–111)
Chloride: 103 mmol/L (ref 98–111)
Chloride: 103 mmol/L (ref 98–111)
Chloride: 105 mmol/L (ref 98–111)
Creatinine, Ser: 1 mg/dL (ref 0.44–1.00)
Creatinine, Ser: 1 mg/dL (ref 0.44–1.00)
Creatinine, Ser: 0.9 mg/dL (ref 0.44–1.00)
Creatinine, Ser: 0.8 mg/dL (ref 0.44–1.00)
Creatinine, Ser: 0.9 mg/dL (ref 0.44–1.00)
Glucose, Bld: 115 mg/dL — ABNORMAL HIGH (ref 70–99)
Glucose, Bld: 108 mg/dL — ABNORMAL HIGH (ref 70–99)
Glucose, Bld: 113 mg/dL — ABNORMAL HIGH (ref 70–99)
Glucose, Bld: 105 mg/dL — ABNORMAL HIGH (ref 70–99)
Glucose, Bld: 132 mg/dL — ABNORMAL HIGH (ref 70–99)
HCT: 28 % — ABNORMAL LOW (ref 36.0–46.0)
HCT: 22 % — ABNORMAL LOW (ref 36.0–46.0)
HCT: 26 % — ABNORMAL LOW (ref 36.0–46.0)
HCT: 22 % — ABNORMAL LOW (ref 36.0–46.0)
HCT: 24 % — ABNORMAL LOW (ref 36.0–46.0)
Hemoglobin: 9.5 g/dL — ABNORMAL LOW (ref 12.0–15.0)
Hemoglobin: 7.5 g/dL — ABNORMAL LOW (ref 12.0–15.0)
Hemoglobin: 8.8 g/dL — ABNORMAL LOW (ref 12.0–15.0)
Hemoglobin: 7.5 g/dL — ABNORMAL LOW (ref 12.0–15.0)
Hemoglobin: 8.2 g/dL — ABNORMAL LOW (ref 12.0–15.0)
Potassium: 5.5 mmol/L — ABNORMAL HIGH (ref 3.5–5.1)
Potassium: 5.3 mmol/L — ABNORMAL HIGH (ref 3.5–5.1)
Potassium: 5.6 mmol/L — ABNORMAL HIGH (ref 3.5–5.1)
Potassium: 6.3 mmol/L (ref 3.5–5.1)
Potassium: 5.3 mmol/L — ABNORMAL HIGH (ref 3.5–5.1)
Sodium: 138 mmol/L (ref 135–145)
Sodium: 138 mmol/L (ref 135–145)
Sodium: 138 mmol/L (ref 135–145)
Sodium: 133 mmol/L — ABNORMAL LOW (ref 135–145)
Sodium: 137 mmol/L (ref 135–145)
TCO2: 27 mmol/L (ref 22–32)
TCO2: 24 mmol/L (ref 22–32)
TCO2: 27 mmol/L (ref 22–32)
TCO2: 26 mmol/L (ref 22–32)
TCO2: 23 mmol/L (ref 22–32)

## 2018-06-28 LAB — BASIC METABOLIC PANEL
Anion gap: 4 — ABNORMAL LOW (ref 5–15)
BUN: 25 mg/dL — ABNORMAL HIGH (ref 6–20)
CO2: 27 mmol/L (ref 22–32)
Calcium: 9 mg/dL (ref 8.9–10.3)
Chloride: 108 mmol/L (ref 98–111)
Creatinine, Ser: 1.15 mg/dL — ABNORMAL HIGH (ref 0.44–1.00)
GFR calc Af Amer: >60 mL/min (ref >60)
GFR calc non Af Amer: 53 mL/min — ABNORMAL LOW (ref >60)
Glucose, Bld: 160 mg/dL — ABNORMAL HIGH (ref 70–99)
Potassium: 5.3 mmol/L — ABNORMAL HIGH (ref 3.5–5.1)
Sodium: 139 mmol/L (ref 135–145)

## 2018-06-28 LAB — CBC
HCT: 28.4 % — ABNORMAL LOW (ref 36.0–46.0)
HCT: 30.2 % — ABNORMAL LOW (ref 36.0–46.0)
Hemoglobin: 8.5 g/dL — ABNORMAL LOW (ref 12.0–15.0)
Hemoglobin: 9.1 g/dL — ABNORMAL LOW (ref 12.0–15.0)
MCH: 26.3 pg (ref 26.0–34.0)
MCH: 25.9 pg — ABNORMAL LOW (ref 26.0–34.0)
MCHC: 29.9 g/dL — ABNORMAL LOW (ref 30.0–36.0)
MCHC: 30.1 g/dL (ref 30.0–36.0)
MCV: 87.9 fL (ref 80.0–100.0)
MCV: 85.8 fL (ref 80.0–100.0)
Platelets: 454 10*3/uL — ABNORMAL HIGH (ref 150–400)
Platelets: 256 10*3/uL (ref 150–400)
RBC: 3.23 MIL/uL — ABNORMAL LOW (ref 3.87–5.11)
RBC: 3.52 MIL/uL — ABNORMAL LOW (ref 3.87–5.11)
RDW: 13.6 % (ref 11.5–15.5)
RDW: 14.6 % (ref 11.5–15.5)
WBC: 9.5 10*3/uL (ref 4.0–10.5)
WBC: 14.2 10*3/uL — ABNORMAL HIGH (ref 4.0–10.5)
nRBC: 0 % (ref 0.0–0.2)
nRBC: 0 % (ref 0.0–0.2)

## 2018-06-28 LAB — GLUCOSE, CAPILLARY
Glucose-Capillary: 162 mg/dL — ABNORMAL HIGH (ref 70–99)
Glucose-Capillary: 138 mg/dL — ABNORMAL HIGH (ref 70–99)
Glucose-Capillary: 130 mg/dL — ABNORMAL HIGH (ref 70–99)
Glucose-Capillary: 135 mg/dL — ABNORMAL HIGH (ref 70–99)
Glucose-Capillary: 135 mg/dL — ABNORMAL HIGH (ref 70–99)

## 2018-06-28 LAB — CULTURE, BLOOD (ROUTINE X 2)
Culture: NO GROWTH
Culture: NO GROWTH
Special Requests: ADEQUATE

## 2018-06-28 LAB — PROTIME-INR
INR: 1.3
Prothrombin Time: 16 seconds — ABNORMAL HIGH (ref 11.4–15.2)

## 2018-06-28 LAB — HEMOGLOBIN AND HEMATOCRIT, BLOOD
HCT: 23.7 % — ABNORMAL LOW (ref 36.0–46.0)
Hemoglobin: 7.3 g/dL — ABNORMAL LOW (ref 12.0–15.0)

## 2018-06-28 LAB — PREPARE RBC (CROSSMATCH)

## 2018-06-28 LAB — HEPARIN LEVEL (UNFRACTIONATED): Heparin Unfractionated: 0.49 IU/mL (ref 0.30–0.70)

## 2018-06-28 LAB — PLATELET COUNT: Platelets: 285 10*3/uL (ref 150–400)

## 2018-06-28 LAB — APTT: aPTT: 25 seconds (ref 24–36)

## 2018-06-28 SURGERY — CORONARY ARTERY BYPASS GRAFTING (CABG)
Anesthesia: General | Site: Leg Upper | Laterality: Right

## 2018-06-28 MED ORDER — SODIUM CHLORIDE 0.9 % IV SOLN
INTRAVENOUS | Status: DC | PRN
  Administered 2018-06-28: 1 [IU]/h via INTRAVENOUS

## 2018-06-28 MED ORDER — ALBUMIN HUMAN 5 % IV SOLN
INTRAVENOUS | Status: DC | PRN
  Administered 2018-06-28: 19:00:00 via INTRAVENOUS

## 2018-06-28 MED ORDER — LACTATED RINGERS IV SOLN
INTRAVENOUS | Status: DC | PRN
  Administered 2018-06-28 (×2): via INTRAVENOUS

## 2018-06-28 MED ORDER — SODIUM CHLORIDE 0.9 % IV SOLN
250.0000 mL | INTRAVENOUS | Status: DC
  Administered 2018-06-29: 250 mL via INTRAVENOUS

## 2018-06-28 MED ORDER — DEXMEDETOMIDINE HCL IN NACL 200 MCG/50ML IV SOLN
INTRAVENOUS | Status: AC
  Filled 2018-06-28: qty 50

## 2018-06-28 MED ORDER — 0.9 % SODIUM CHLORIDE (POUR BTL) OPTIME
TOPICAL | Status: DC | PRN
  Administered 2018-06-28: 6000 mL

## 2018-06-28 MED ORDER — SODIUM CHLORIDE 0.45 % IV SOLN
INTRAVENOUS | Status: DC | PRN
  Administered 2018-06-28: 21:00:00 via INTRAVENOUS

## 2018-06-28 MED ORDER — VANCOMYCIN HCL IN DEXTROSE 1-5 GM/200ML-% IV SOLN
1000.0000 mg | Freq: Once | INTRAVENOUS | Status: AC
  Administered 2018-06-29: 1000 mg via INTRAVENOUS
  Filled 2018-06-28: qty 200

## 2018-06-28 MED ORDER — LACTATED RINGERS IV SOLN
INTRAVENOUS | Status: DC
  Administered 2018-06-28: 21:00:00 via INTRAVENOUS

## 2018-06-28 MED ORDER — LACTATED RINGERS IV SOLN
500.0000 mL | Freq: Once | INTRAVENOUS | Status: AC | PRN
  Administered 2018-06-28: 500 mL via INTRAVENOUS

## 2018-06-28 MED ORDER — ACETAMINOPHEN 500 MG PO TABS
1000.0000 mg | ORAL_TABLET | Freq: Four times a day (QID) | ORAL | Status: AC
  Administered 2018-06-29 – 2018-07-03 (×18): 1000 mg via ORAL
  Filled 2018-06-28 (×18): qty 2

## 2018-06-28 MED ORDER — PHENYLEPHRINE HCL-NACL 20-0.9 MG/250ML-% IV SOLN
0.0000 ug/min | INTRAVENOUS | Status: DC
  Filled 2018-06-28: qty 250

## 2018-06-28 MED ORDER — SODIUM CHLORIDE 0.9 % IV SOLN
1.5000 g | Freq: Two times a day (BID) | INTRAVENOUS | Status: AC
  Administered 2018-06-28 – 2018-06-30 (×4): 1.5 g via INTRAVENOUS
  Filled 2018-06-28 (×5): qty 1.5

## 2018-06-28 MED ORDER — LACTATED RINGERS IV SOLN
INTRAVENOUS | Status: DC
  Administered 2018-06-29 (×2): via INTRAVENOUS

## 2018-06-28 MED ORDER — MIDAZOLAM HCL 2 MG/2ML IJ SOLN
2.0000 mg | INTRAMUSCULAR | Status: DC | PRN
  Administered 2018-06-29 (×2): 2 mg via INTRAVENOUS
  Filled 2018-06-28 (×2): qty 2

## 2018-06-28 MED ORDER — FENTANYL CITRATE (PF) 100 MCG/2ML IJ SOLN
25.0000 ug | INTRAMUSCULAR | Status: DC | PRN
  Administered 2018-06-28 – 2018-06-29 (×3): 25 ug via INTRAVENOUS
  Filled 2018-06-28 (×3): qty 2

## 2018-06-28 MED ORDER — FAMOTIDINE IN NACL 20-0.9 MG/50ML-% IV SOLN
20.0000 mg | Freq: Two times a day (BID) | INTRAVENOUS | Status: AC
  Administered 2018-06-28: 20 mg via INTRAVENOUS

## 2018-06-28 MED ORDER — METOPROLOL TARTRATE 25 MG/10 ML ORAL SUSPENSION
12.5000 mg | Freq: Two times a day (BID) | ORAL | Status: DC
  Filled 2018-06-28 (×2): qty 10

## 2018-06-28 MED ORDER — LIDOCAINE 2% (20 MG/ML) 5 ML SYRINGE
INTRAMUSCULAR | Status: AC
  Filled 2018-06-28: qty 5

## 2018-06-28 MED ORDER — SODIUM CHLORIDE 0.9 % IV SOLN
INTRAVENOUS | Status: DC | PRN
  Administered 2018-06-28: 20 ug/min via INTRAVENOUS

## 2018-06-28 MED ORDER — MIDAZOLAM HCL 5 MG/ML IJ SOLN
INTRAMUSCULAR | Status: DC | PRN
  Administered 2018-06-28: 1 mg via INTRAVENOUS
  Administered 2018-06-28: 2 mg via INTRAVENOUS
  Administered 2018-06-28 (×2): 1 mg via INTRAVENOUS
  Administered 2018-06-28: 3 mg via INTRAVENOUS
  Administered 2018-06-28: 2 mg via INTRAVENOUS

## 2018-06-28 MED ORDER — METOPROLOL TARTRATE 5 MG/5ML IV SOLN
2.5000 mg | INTRAVENOUS | Status: DC | PRN
  Administered 2018-06-28 (×2): 4 mg via INTRAVENOUS
  Administered 2018-06-29 (×2): 5 mg via INTRAVENOUS
  Filled 2018-06-28 (×2): qty 5

## 2018-06-28 MED ORDER — INSULIN REGULAR(HUMAN) IN NACL 100-0.9 UT/100ML-% IV SOLN: INTRAVENOUS | Status: DC

## 2018-06-28 MED ORDER — MAGNESIUM SULFATE 4 GM/100ML IV SOLN
4.0000 g | Freq: Once | INTRAVENOUS | Status: AC
  Administered 2018-06-28: 4 g via INTRAVENOUS
  Filled 2018-06-28: qty 100

## 2018-06-28 MED ORDER — SODIUM CHLORIDE 0.9 % IV SOLN
INTRAVENOUS | Status: DC | PRN
  Administered 2018-06-28: 750 mg via INTRAVENOUS

## 2018-06-28 MED ORDER — CALCIUM CHLORIDE 10 % IV SOLN
INTRAVENOUS | Status: DC | PRN
  Administered 2018-06-28: 0.5 g via INTRAVENOUS

## 2018-06-28 MED ORDER — FENTANYL CITRATE (PF) 250 MCG/5ML IJ SOLN
INTRAMUSCULAR | Status: AC
  Filled 2018-06-28: qty 5

## 2018-06-28 MED ORDER — FUROSEMIDE 10 MG/ML IJ SOLN
INTRAMUSCULAR | Status: AC
  Filled 2018-06-28: qty 4

## 2018-06-28 MED ORDER — ACETAMINOPHEN 160 MG/5ML PO SOLN: 650.0000 mg | Freq: Once | ORAL | Status: DC

## 2018-06-28 MED ORDER — PANTOPRAZOLE SODIUM 40 MG PO TBEC
40.0000 mg | DELAYED_RELEASE_TABLET | Freq: Every day | ORAL | Status: DC
  Administered 2018-06-30 – 2018-07-06 (×7): 40 mg via ORAL
  Filled 2018-06-28 (×7): qty 1

## 2018-06-28 MED ORDER — METOPROLOL TARTRATE 12.5 MG HALF TABLET
12.5000 mg | ORAL_TABLET | Freq: Two times a day (BID) | ORAL | Status: DC
  Administered 2018-06-29 – 2018-07-03 (×9): 12.5 mg via ORAL
  Filled 2018-06-28 (×9): qty 1

## 2018-06-28 MED ORDER — FUROSEMIDE 10 MG/ML IJ SOLN
INTRAMUSCULAR | Status: DC | PRN
  Administered 2018-06-28: 20 mg via INTRAMUSCULAR

## 2018-06-28 MED ORDER — HEMOSTATIC AGENTS (NO CHARGE) OPTIME
TOPICAL | Status: DC | PRN
  Administered 2018-06-28: 1 via TOPICAL

## 2018-06-28 MED ORDER — PROPOFOL 10 MG/ML IV BOLUS
INTRAVENOUS | Status: DC | PRN
  Administered 2018-06-28: 70 mg via INTRAVENOUS
  Administered 2018-06-28: 30 mg via INTRAVENOUS

## 2018-06-28 MED ORDER — DOCUSATE SODIUM 100 MG PO CAPS
200.0000 mg | ORAL_CAPSULE | Freq: Every day | ORAL | Status: DC
  Administered 2018-06-29 – 2018-07-01 (×3): 200 mg via ORAL
  Filled 2018-06-28 (×6): qty 2

## 2018-06-28 MED ORDER — DOPAMINE-DEXTROSE 3.2-5 MG/ML-% IV SOLN
2.0000 ug/kg/min | INTRAVENOUS | Status: DC
  Administered 2018-06-28: 3 ug/kg/min via INTRAVENOUS
  Administered 2018-06-29: 2 ug/kg/min via INTRAVENOUS
  Filled 2018-06-28: qty 250

## 2018-06-28 MED ORDER — INSULIN GLARGINE 100 UNIT/ML ~~LOC~~ SOLN
15.0000 [IU] | Freq: Every day | SUBCUTANEOUS | Status: DC
  Administered 2018-06-28: 15 [IU] via SUBCUTANEOUS
  Filled 2018-06-28 (×2): qty 0.15

## 2018-06-28 MED ORDER — PROPOFOL 10 MG/ML IV BOLUS
INTRAVENOUS | Status: AC
  Filled 2018-06-28: qty 20

## 2018-06-28 MED ORDER — ONDANSETRON HCL 4 MG/2ML IJ SOLN
4.0000 mg | Freq: Four times a day (QID) | INTRAMUSCULAR | Status: DC | PRN
  Administered 2018-07-01 – 2018-07-06 (×3): 4 mg via INTRAVENOUS
  Filled 2018-06-28 (×3): qty 2

## 2018-06-28 MED ORDER — SODIUM CHLORIDE 0.9 % IV SOLN
INTRAVENOUS | Status: DC | PRN
  Administered 2018-06-28: 19:00:00 via INTRAVENOUS

## 2018-06-28 MED ORDER — DEXMEDETOMIDINE HCL IN NACL 400 MCG/100ML IV SOLN
0.0000 ug/kg/h | INTRAVENOUS | Status: DC
  Administered 2018-06-28: 0.5 ug/kg/h via INTRAVENOUS
  Filled 2018-06-28: qty 100

## 2018-06-28 MED ORDER — HEPARIN SODIUM (PORCINE) 1000 UNIT/ML IJ SOLN
INTRAMUSCULAR | Status: AC
  Filled 2018-06-28: qty 2

## 2018-06-28 MED ORDER — NITROGLYCERIN IN D5W 200-5 MCG/ML-% IV SOLN
INTRAVENOUS | Status: DC | PRN
  Administered 2018-06-28: 10 ug/min via INTRAVENOUS

## 2018-06-28 MED ORDER — FENTANYL CITRATE (PF) 100 MCG/2ML IJ SOLN
INTRAMUSCULAR | Status: AC
  Filled 2018-06-28: qty 2

## 2018-06-28 MED ORDER — MORPHINE SULFATE (PF) 2 MG/ML IV SOLN: 1.0000 mg | INTRAVENOUS | Status: DC | PRN

## 2018-06-28 MED ORDER — TRAMADOL HCL 50 MG PO TABS
50.0000 mg | ORAL_TABLET | ORAL | Status: DC | PRN
  Administered 2018-06-30: 100 mg via ORAL
  Filled 2018-06-28: qty 2

## 2018-06-28 MED ORDER — SODIUM CHLORIDE 0.9 % IV SOLN
INTRAVENOUS | Status: DC
  Administered 2018-06-28: 22:00:00 via INTRAVENOUS

## 2018-06-28 MED ORDER — PHENYLEPHRINE 40 MCG/ML (10ML) SYRINGE FOR IV PUSH (FOR BLOOD PRESSURE SUPPORT)
PREFILLED_SYRINGE | INTRAVENOUS | Status: AC
  Filled 2018-06-28: qty 10

## 2018-06-28 MED ORDER — LACTATED RINGERS IV SOLN
INTRAVENOUS | Status: DC
  Administered 2018-06-28: 13:00:00 via INTRAVENOUS

## 2018-06-28 MED ORDER — INSULIN REGULAR BOLUS VIA INFUSION
0.0000 [IU] | Freq: Three times a day (TID) | INTRAVENOUS | Status: DC
  Filled 2018-06-28: qty 10

## 2018-06-28 MED ORDER — LACTATED RINGERS IV SOLN
INTRAVENOUS | Status: DC | PRN
  Administered 2018-06-28: 14:00:00 via INTRAVENOUS

## 2018-06-28 MED ORDER — FENTANYL CITRATE (PF) 250 MCG/5ML IJ SOLN
INTRAMUSCULAR | Status: AC
  Filled 2018-06-28: qty 10

## 2018-06-28 MED ORDER — CHLORHEXIDINE GLUCONATE 0.12 % MT SOLN
15.0000 mL | OROMUCOSAL | Status: AC
  Administered 2018-06-28: 15 mL via OROMUCOSAL

## 2018-06-28 MED ORDER — PROTAMINE SULFATE 10 MG/ML IV SOLN
INTRAVENOUS | Status: DC | PRN
  Administered 2018-06-28: 270 mg via INTRAVENOUS

## 2018-06-28 MED ORDER — EPHEDRINE 5 MG/ML INJ
INTRAVENOUS | Status: AC
  Filled 2018-06-28: qty 10

## 2018-06-28 MED ORDER — ACETAMINOPHEN 160 MG/5ML PO SOLN
1000.0000 mg | Freq: Four times a day (QID) | ORAL | Status: AC
  Administered 2018-06-28 – 2018-06-29 (×2): 1000 mg
  Filled 2018-06-28 (×2): qty 40.6

## 2018-06-28 MED ORDER — MIDAZOLAM HCL 10 MG/2ML IJ SOLN
INTRAMUSCULAR | Status: AC
  Filled 2018-06-28: qty 2

## 2018-06-28 MED ORDER — OXYCODONE HCL 5 MG PO TABS
5.0000 mg | ORAL_TABLET | ORAL | Status: DC | PRN
  Administered 2018-06-29: 5 mg via ORAL
  Administered 2018-06-29: 10 mg via ORAL
  Administered 2018-06-30 – 2018-07-01 (×3): 5 mg via ORAL
  Filled 2018-06-28: qty 1
  Filled 2018-06-28: qty 2
  Filled 2018-06-28 (×3): qty 1

## 2018-06-28 MED ORDER — SODIUM CHLORIDE 0.9% FLUSH
3.0000 mL | Freq: Two times a day (BID) | INTRAVENOUS | Status: DC
  Administered 2018-06-29 – 2018-06-30 (×3): 3 mL via INTRAVENOUS
  Administered 2018-07-01: 10 mL via INTRAVENOUS
  Administered 2018-07-01 – 2018-07-02 (×2): 3 mL via INTRAVENOUS

## 2018-06-28 MED ORDER — MORPHINE SULFATE (PF) 2 MG/ML IV SOLN: 2.0000 mg | INTRAVENOUS | Status: DC | PRN

## 2018-06-28 MED ORDER — FENTANYL CITRATE (PF) 250 MCG/5ML IJ SOLN
INTRAMUSCULAR | Status: DC | PRN
  Administered 2018-06-28: 250 ug via INTRAVENOUS
  Administered 2018-06-28: 300 ug via INTRAVENOUS
  Administered 2018-06-28: 200 ug via INTRAVENOUS
  Administered 2018-06-28: 250 ug via INTRAVENOUS

## 2018-06-28 MED ORDER — ROCURONIUM BROMIDE 50 MG/5ML IV SOSY
PREFILLED_SYRINGE | INTRAVENOUS | Status: AC
  Filled 2018-06-28: qty 15

## 2018-06-28 MED ORDER — ROCURONIUM BROMIDE 10 MG/ML (PF) SYRINGE
PREFILLED_SYRINGE | INTRAVENOUS | Status: DC | PRN
  Administered 2018-06-28 (×4): 50 mg via INTRAVENOUS

## 2018-06-28 MED ORDER — TRANEXAMIC ACID 1000 MG/10ML IV SOLN
INTRAVENOUS | Status: DC | PRN
  Administered 2018-06-28: 1.5 mg/kg/h via INTRAVENOUS

## 2018-06-28 MED ORDER — SODIUM CHLORIDE 0.9% FLUSH: 3.0000 mL | INTRAVENOUS | Status: DC | PRN

## 2018-06-28 MED ORDER — HEPARIN SODIUM (PORCINE) 1000 UNIT/ML IJ SOLN
INTRAMUSCULAR | Status: AC
  Filled 2018-06-28: qty 1

## 2018-06-28 MED ORDER — BISACODYL 10 MG RE SUPP: 10.0000 mg | Freq: Every day | RECTAL | Status: DC

## 2018-06-28 MED ORDER — ACETAMINOPHEN 650 MG RE SUPP: 650.0000 mg | Freq: Once | RECTAL | Status: DC

## 2018-06-28 MED ORDER — NITROGLYCERIN IN D5W 200-5 MCG/ML-% IV SOLN
0.0000 ug/min | INTRAVENOUS | Status: AC
  Administered 2018-06-29: 5 ug/min via INTRAVENOUS
  Filled 2018-06-28: qty 250

## 2018-06-28 MED ORDER — DEXMEDETOMIDINE HCL IN NACL 400 MCG/100ML IV SOLN
INTRAVENOUS | Status: DC | PRN
  Administered 2018-06-28: .5 ug/kg/h via INTRAVENOUS

## 2018-06-28 MED ORDER — ASPIRIN 81 MG PO CHEW: 324.0000 mg | CHEWABLE_TABLET | Freq: Every day | ORAL | Status: DC

## 2018-06-28 MED ORDER — HEPARIN SODIUM (PORCINE) 1000 UNIT/ML IJ SOLN
INTRAMUSCULAR | Status: DC | PRN
  Administered 2018-06-28: 25000 [IU] via INTRAVENOUS
  Administered 2018-06-28: 15000 [IU] via INTRAVENOUS
  Administered 2018-06-28: 2000 [IU] via INTRAVENOUS

## 2018-06-28 MED ORDER — POTASSIUM CHLORIDE 10 MEQ/50ML IV SOLN: 10.0000 meq | INTRAVENOUS | Status: AC

## 2018-06-28 MED ORDER — MIDAZOLAM HCL 2 MG/2ML IJ SOLN
INTRAMUSCULAR | Status: AC
  Filled 2018-06-28: qty 2

## 2018-06-28 MED ORDER — BISACODYL 5 MG PO TBEC
10.0000 mg | DELAYED_RELEASE_TABLET | Freq: Every day | ORAL | Status: DC
  Administered 2018-06-29 – 2018-07-01 (×3): 10 mg via ORAL
  Filled 2018-06-28 (×4): qty 2

## 2018-06-28 MED ORDER — ASPIRIN EC 325 MG PO TBEC
325.0000 mg | DELAYED_RELEASE_TABLET | Freq: Every day | ORAL | Status: DC
  Administered 2018-06-29 – 2018-07-06 (×8): 325 mg via ORAL
  Filled 2018-06-28 (×8): qty 1

## 2018-06-28 MED ORDER — ALBUMIN HUMAN 5 % IV SOLN
250.0000 mL | INTRAVENOUS | Status: AC | PRN
  Administered 2018-06-28 (×3): 12.5 g via INTRAVENOUS
  Filled 2018-06-28: qty 250

## 2018-06-28 SURGICAL SUPPLY — 96 items
APPLIER CLIP 9.375 SM OPEN (CLIP)
BAG DECANTER FOR FLEXI CONT (MISCELLANEOUS) ×5 IMPLANT
BANDAGE ACE 4X5 VEL STRL LF (GAUZE/BANDAGES/DRESSINGS) ×5 IMPLANT
BANDAGE ACE 6X5 VEL STRL LF (GAUZE/BANDAGES/DRESSINGS) ×5 IMPLANT
BANDAGE ELASTIC 4 VELCRO ST LF (GAUZE/BANDAGES/DRESSINGS) ×2 IMPLANT
BANDAGE ELASTIC 6 VELCRO ST LF (GAUZE/BANDAGES/DRESSINGS) ×1 IMPLANT
BASKET HEART (ORDER IN 25'S) (MISCELLANEOUS) ×1
BASKET HEART (ORDER IN 25S) (MISCELLANEOUS) ×4 IMPLANT
BLADE STERNUM SYSTEM 6 (BLADE) ×5 IMPLANT
BLADE SURG 15 STRL LF DISP TIS (BLADE) IMPLANT
BLADE SURG 15 STRL SS (BLADE) ×1
BNDG GAUZE ELAST 4 BULKY (GAUZE/BANDAGES/DRESSINGS) ×6 IMPLANT
CANISTER SUCT 3000ML PPV (MISCELLANEOUS) ×5 IMPLANT
CANNULA EZ GLIDE AORTIC 21FR (CANNULA) ×5 IMPLANT
CATH CPB KIT HENDRICKSON (MISCELLANEOUS) ×5 IMPLANT
CATH ROBINSON RED A/P 18FR (CATHETERS) ×5 IMPLANT
CATH THORACIC 36FR (CATHETERS) ×5 IMPLANT
CATH THORACIC 36FR RT ANG (CATHETERS) ×5 IMPLANT
CLIP APPLIE 9.375 SM OPEN (CLIP) IMPLANT
CLIP VESOCCLUDE MED 24/CT (CLIP) IMPLANT
CLIP VESOCCLUDE SM WIDE 24/CT (CLIP) ×1 IMPLANT
COVER BACK TABLE 80X110 HD (DRAPES) ×1 IMPLANT
COVER MAYO STAND STRL (DRAPES) ×1 IMPLANT
COVER WAND RF STERILE (DRAPES) ×4 IMPLANT
CRADLE DONUT ADULT HEAD (MISCELLANEOUS) ×5 IMPLANT
DRAPE CARDIOVASCULAR INCISE (DRAPES) ×1
DRAPE EXTREMITY T 121X128X90 (DRAPE) ×1 IMPLANT
DRAPE HALF SHEET 40X57 (DRAPES) ×1 IMPLANT
DRAPE SLUSH/WARMER DISC (DRAPES) ×5 IMPLANT
DRAPE SRG 135X102X78XABS (DRAPES) ×4 IMPLANT
DRSG COVADERM 4X14 (GAUZE/BANDAGES/DRESSINGS) ×5 IMPLANT
ELECT REM PT RETURN 9FT ADLT (ELECTROSURGICAL) ×10
ELECTRODE REM PT RTRN 9FT ADLT (ELECTROSURGICAL) ×8 IMPLANT
FELT TEFLON 1X6 (MISCELLANEOUS) ×9 IMPLANT
GAUZE SPONGE 4X4 12PLY STRL (GAUZE/BANDAGES/DRESSINGS) ×10 IMPLANT
GAUZE SPONGE 4X4 12PLY STRL LF (GAUZE/BANDAGES/DRESSINGS) ×1 IMPLANT
GEL ULTRASOUND 20GR AQUASONIC (MISCELLANEOUS) IMPLANT
GLOVE SURG SIGNA 7.5 PF LTX (GLOVE) ×15 IMPLANT
GOWN STRL REUS W/ TWL LRG LVL3 (GOWN DISPOSABLE) ×16 IMPLANT
GOWN STRL REUS W/ TWL XL LVL3 (GOWN DISPOSABLE) ×8 IMPLANT
GOWN STRL REUS W/TWL LRG LVL3 (GOWN DISPOSABLE) ×7
GOWN STRL REUS W/TWL XL LVL3 (GOWN DISPOSABLE) ×2
HARMONIC SHEARS 14CM COAG (MISCELLANEOUS) ×4 IMPLANT
HEMOSTAT ARISTA ABSORB 3G PWDR (MISCELLANEOUS) ×2 IMPLANT
HEMOSTAT POWDER SURGIFOAM 1G (HEMOSTASIS) ×15 IMPLANT
HEMOSTAT SURGICEL 2X14 (HEMOSTASIS) ×5 IMPLANT
INSERT FOGARTY XLG (MISCELLANEOUS) IMPLANT
KIT BASIN OR (CUSTOM PROCEDURE TRAY) ×5 IMPLANT
KIT SUCTION CATH 14FR (SUCTIONS) ×10 IMPLANT
KIT TURNOVER KIT B (KITS) ×5 IMPLANT
KIT VASOVIEW HEMOPRO VH 3000 (KITS) ×5 IMPLANT
MARKER GRAFT CORONARY BYPASS (MISCELLANEOUS) ×15 IMPLANT
NS IRRIG 1000ML POUR BTL (IV SOLUTION) ×26 IMPLANT
PACK E OPEN HEART (SUTURE) ×5 IMPLANT
PACK OPEN HEART (CUSTOM PROCEDURE TRAY) ×5 IMPLANT
PAD ARMBOARD 7.5X6 YLW CONV (MISCELLANEOUS) ×10 IMPLANT
PAD ELECT DEFIB RADIOL ZOLL (MISCELLANEOUS) ×5 IMPLANT
PENCIL BUTTON HOLSTER BLD 10FT (ELECTRODE) ×5 IMPLANT
PUNCH AORTIC ROTATE 4.0MM (MISCELLANEOUS) IMPLANT
PUNCH AORTIC ROTATE 4.5MM 8IN (MISCELLANEOUS) IMPLANT
PUNCH AORTIC ROTATE 5MM 8IN (MISCELLANEOUS) IMPLANT
SENSOR MYOCARDIAL TEMP (MISCELLANEOUS) ×1 IMPLANT
SET CARDIOPLEGIA MPS 5001102 (MISCELLANEOUS) ×1 IMPLANT
SHEARS HARMONIC 9CM CVD (BLADE) ×5 IMPLANT
SUT BONE WAX W31G (SUTURE) ×5 IMPLANT
SUT MNCRL AB 4-0 PS2 18 (SUTURE) IMPLANT
SUT PROLENE 3 0 SH DA (SUTURE) ×5 IMPLANT
SUT PROLENE 4 0 RB 1 (SUTURE)
SUT PROLENE 4 0 SH DA (SUTURE) IMPLANT
SUT PROLENE 4-0 RB1 .5 CRCL 36 (SUTURE) IMPLANT
SUT PROLENE 6 0 C 1 30 (SUTURE) ×13 IMPLANT
SUT PROLENE 7 0 BV 1 (SUTURE) ×1 IMPLANT
SUT PROLENE 7 0 BV1 MDA (SUTURE) ×6 IMPLANT
SUT PROLENE 8 0 BV175 6 (SUTURE) ×4 IMPLANT
SUT STEEL 6MS V (SUTURE) ×5 IMPLANT
SUT STEEL STERNAL CCS#1 18IN (SUTURE) IMPLANT
SUT STEEL SZ 6 DBL 3X14 BALL (SUTURE) ×5 IMPLANT
SUT VIC AB 1 CTX 36 (SUTURE) ×2
SUT VIC AB 1 CTX36XBRD ANBCTR (SUTURE) ×8 IMPLANT
SUT VIC AB 2-0 CT1 27 (SUTURE) ×2
SUT VIC AB 2-0 CT1 TAPERPNT 27 (SUTURE) IMPLANT
SUT VIC AB 2-0 CTX 27 (SUTURE) IMPLANT
SUT VIC AB 3-0 SH 27 (SUTURE)
SUT VIC AB 3-0 SH 27X BRD (SUTURE) IMPLANT
SUT VIC AB 3-0 X1 27 (SUTURE) ×2 IMPLANT
SUT VICRYL 4-0 PS2 18IN ABS (SUTURE) IMPLANT
SYR 50ML SLIP (SYRINGE) IMPLANT
SYSTEM SAHARA CHEST DRAIN ATS (WOUND CARE) ×5 IMPLANT
TAPE CLOTH SURG 4X10 WHT LF (GAUZE/BANDAGES/DRESSINGS) ×1 IMPLANT
TOWEL GREEN STERILE (TOWEL DISPOSABLE) ×5 IMPLANT
TOWEL GREEN STERILE FF (TOWEL DISPOSABLE) ×5 IMPLANT
TRAY FOLEY SLVR 16FR TEMP STAT (SET/KITS/TRAYS/PACK) ×5 IMPLANT
TUBE FEEDING 8FR 16IN STR KANG (MISCELLANEOUS) ×5 IMPLANT
TUBING INSUFFLATION (TUBING) ×5 IMPLANT
UNDERPAD 30X30 (UNDERPADS AND DIAPERS) ×5 IMPLANT
WATER STERILE IRR 1000ML POUR (IV SOLUTION) ×10 IMPLANT

## 2018-06-28 NOTE — Anesthesia Preprocedure Evaluation (Addendum)
Anesthesia Evaluation  Patient identified by MRN, date of birth, ID band Patient awake    Reviewed: Allergy & Precautions, NPO status , Patient's Chart, lab work & pertinent test results  Airway Mallampati: II  TM Distance: >3 FB Neck ROM: Full    Dental  (+) Edentulous Upper, Edentulous Lower   Pulmonary former smoker,    breath sounds clear to auscultation       Cardiovascular hypertension,  Rhythm:Regular Rate:Normal     Neuro/Psych    GI/Hepatic   Endo/Other  diabetes  Renal/GU      Musculoskeletal   Abdominal   Peds  Hematology   Anesthesia Other Findings   Reproductive/Obstetrics                             Anesthesia Physical Anesthesia Plan  ASA: IV  Anesthesia Plan: General   Post-op Pain Management:    Induction: Intravenous  PONV Risk Score and Plan: Ondansetron  Airway Management Planned: Oral ETT  Additional Equipment: Arterial line, PA Cath, 3D TEE and Ultrasound Guidance Line Placement  Intra-op Plan:   Post-operative Plan: Post-operative intubation/ventilation  Informed Consent: I have reviewed the patients History and Physical, chart, labs and discussed the procedure including the risks, benefits and alternatives for the proposed anesthesia with the patient or authorized representative who has indicated his/her understanding and acceptance.     Plan Discussed with: CRNA and Anesthesiologist  Anesthesia Plan Comments:         Anesthesia Quick Evaluation

## 2018-06-28 NOTE — Progress Notes (Signed)
[place] for heparin Indication: chest pain/ACS  Allergies  Allergen Reactions  . Lyrica [Pregabalin] Other (See Comments)    MADE PATIENT VERY EMOTIONAL AND WOULD CRY EASILY  . Morphine And Related Nausea And Vomiting  . Tramadol Nausea And Vomiting    Pt cant tolerate this pain med.     Patient Measurements: Height: 5\' 2"  (157.5 cm) Weight: 168 lb 8 oz (76.4 kg) IBW/kg (Calculated) : 50.1 Heparin Dosing Weight: 68kg  Vital Signs: Temp: 98.6 F (37 C) (10/21 0516) Temp Source: Oral (10/21 0516) BP: 157/81 (10/21 0516) Pulse Rate: 70 (10/21 0516)  Labs: Recent Labs    06/26/18 0502 06/26/18 1248 06/27/18 0015 06/27/18 0437 06/28/18 0400  HGB 8.6*  --  8.0* 8.2* 8.5*  HCT 27.9*  --  26.0* 27.2* 28.4*  PLT 453*  --  424* 451* 454*  LABPROT 14.4  --   --   --   --   INR 1.13  --   --   --   --   HEPARINUNFRC 0.27* 0.56  --  0.46 0.49  CREATININE 1.23*  --  1.29* 1.28* 1.15*    Estimated Creatinine Clearance: 53.5 mL/min (A) (by C-G formula based on SCr of 1.15 mg/dL (H)).   Medical History: Past Medical History:  Diagnosis Date  . Acute on chronic diastolic CHF (congestive heart failure) ([place]) 06/24/2018  . Anxiety   . COMMON MIGRAINE 10/07/2010  . Decreased visual acuity 11/10/2016  . Depression 12/18/2012  . Diabetes mellitus type II, uncontrolled ([place]) 10/07/2010   Qualifier: Diagnosis of  By: Charlett Blake MD, [name]    . Diabetic foot infection ([place]) 08/26/2016  . Disturbance of skin sensation 10/07/2010  . Heart murmur   . History of kidney stones    "years ago"  . Hyperlipidemia 12/06/2010  . Hypertension   . Lipoma of abdominal wall 10/05/2016  . Overweight(278.02) 12/06/2010  . PERIPHERAL NEUROPATHY, FEET 10/07/2010  . PVD (peripheral vascular disease) ([place]) 01/21/2012  . RESTLESS LEG SYNDROME 10/25/2010  . Stroke [facility]) 2014, 2017   most recently in 2/17 - intracerebral hemorrhage    Assessment: 70 yoF admitted with CP  and SOB to start on IV heparin now awaiting CABG today -HL= 0.49, Hg= 8.5    Goal of Therapy:  Heparin level 0.3-0.7 units/ml Monitor platelets by anticoagulation protocol: Yes   Plan:  -Continue heparin infusion at 1400 units/hr -Plans for surgery today  Hildred Laser, PharmD Clinical Pharmacist Please check Amion for pharmacy contact number

## 2018-06-28 NOTE — Anesthesia Procedure Notes (Signed)
Central Venous Catheter Insertion Performed by: Roberts Gaudy, MD, anesthesiologist Start/End10/21/2019 12:20 PM, 06/28/2018 12:30 PM Patient location: Pre-op. Preanesthetic checklist: patient identified, IV checked, site marked, risks and benefits discussed, surgical consent, monitors and equipment checked, pre-op evaluation, timeout performed and anesthesia consent Hand hygiene performed  and maximum sterile barriers used  PA cath was placed.[place] type:thermodilution Procedure performed without using ultrasound guided technique. Attempts: 1 Following insertion, line sutured, dressing applied and Biopatch. Post procedure assessment: blood return through all ports and free fluid flow  Patient tolerated the procedure well with no immediate complications.

## 2018-06-28 NOTE — Progress Notes (Signed)
Patient is now on mechanical ventilation settings are adjusted per Protocol. Art line is zeroed and ready for use. Good blood flow and flushes w/o complications.

## 2018-06-28 NOTE — Progress Notes (Signed)
Progress Note  Patient Name: [name] Date of Encounter: 06/28/2018  Primary Cardiologist: Elouise Munroe, MD   Subjective   Pt is a little nervous about her upcoming surgery today, but she seems well informed and well prepared. Few questions answered. No chest pain. Occ brief shortness of breath.   Inpatient Medications    Scheduled Meds: . aspirin EC  81 mg Oral Daily  . atorvastatin  80 mg Oral q1800  . carvedilol  6.25 mg Oral BID WC  . Chlorhexidine Gluconate Cloth  6 each Topical Once   And  . Chlorhexidine Gluconate Cloth  6 each Topical Once  . Chlorhexidine Gluconate Cloth  6 each Topical Daily  . escitalopram  10 mg Oral Daily  . ferrous gluconate  324 mg Oral Q breakfast  . gabapentin  300 mg Oral TID  . heparin-papaverine-plasmalyte irrigation   Irrigation To OR  . insulin aspart  0-9 Units Subcutaneous TID WC  . insulin glargine  15 Units Subcutaneous Daily  . insulin   Intravenous To OR  . levothyroxine  25 mcg Oral QAC breakfast  . magnesium sulfate  40 mEq Other To OR  . mupirocin ointment  1 application Nasal BID  . mupirocin ointment   Topical Daily  . phenylephrine  30-200 mcg/min Intravenous To OR  . potassium chloride  80 mEq Other To OR  . sodium chloride flush  3 mL Intravenous Q12H  . tranexamic acid  15 mg/kg Intravenous To OR  . tranexamic acid  2 mg/kg Intracatheter To OR   Continuous Infusions: . sodium chloride    . cefUROXime (ZINACEF)  IV    . cefUROXime (ZINACEF)  IV    . dexmedetomidine    . DOPamine    . epinephrine    . heparin 30,000 units/NS 1000 mL solution for CELLSAVER    . heparin 1,400 Units/hr (06/28/18 0449)  . milrinone    . nitroGLYCERIN    . norepinephrine (LEVOPHED) Adult infusion    . tranexamic acid (CYKLOKAPRON) infusion ([facility])    . vancomycin     PRN Meds: sodium chloride, acetaminophen **OR** acetaminophen, diazepam, fluticasone, nitroGLYCERIN, ondansetron **OR** ondansetron (ZOFRAN) IV, sodium  chloride flush, temazepam, tiZANidine   Vital Signs    Vitals:   06/27/18 1649 06/27/18 2117 06/28/18 0516 06/28/18 0516  BP: 140/78 140/74  (!) 157/81  Pulse: 64 75  70  Resp:  14    Temp:  98.9 F (37.2 C)  98.6 F (37 C)  TempSrc:  Oral  Oral  SpO2:  98%  98%  Weight:   76.4 kg   Height:       No intake or output data in the 24 hours ending 06/28/18 0922 Filed Weights   06/26/18 0516 06/27/18 0508 06/28/18 0516  Weight: 77.6 kg 77.1 kg 76.4 kg    Telemetry    Sinus rhythm in the 60's - Personally Reviewed  ECG    No new tracings - Personally Reviewed  Physical Exam   GEN: No acute distress.   Neck: No JVD Cardiac: RRR, no murmurs, rubs, or gallops.  Respiratory: Clear to auscultation bilaterally. GI: Soft, nontender, non-distended  MS: No edema; No deformity. Darkened skin of lower legs with diminished pulses.  Neuro:  Nonfocal  Psych: Normal affect   Labs    Chemistry Recent Labs  Lab 06/23/18 2116 06/24/18 0928  06/27/18 0015 06/27/18 0437 06/28/18 0400  NA 140 142   < > 141 141 139  K 3.3* 3.5   < > 4.1 4.6 5.3*  CL 103 108   < > 107 106 108  CO2 24 23   < > 27 28 27   GLUCOSE 137* 129*   < > 124* 119* 160*  BUN 18 19   < > 29* 26* 25*  CREATININE 1.01* 1.08*   < > 1.29* 1.28* 1.15*  CALCIUM 8.8* 8.5*   < > 8.6* 8.6* 9.0  PROT 7.1 6.9  --   --   --   --   ALBUMIN 2.6* 2.6*  --   --   --   --   AST 17 22  --   --   --   --   ALT 21 20  --   --   --   --   ALKPHOS 98 87  --   --   --   --   BILITOT 0.5 0.6  --   --   --   --   GFRNONAA >60 57*   < > 46* 47* 53*  GFRAA >60 >60   < > 53* 54* >60  ANIONGAP 13 11   < > 7 7 4*   < > = values in this interval not displayed.     Hematology Recent Labs  Lab 06/27/18 0015 06/27/18 0437 06/28/18 0400  WBC 9.0 8.5 9.5  RBC 2.99* 3.13* 3.23*  HGB 8.0* 8.2* 8.5*  HCT 26.0* 27.2* 28.4*  MCV 87.0 86.9 87.9  MCH 26.8 26.2 26.3  MCHC 30.8 30.1 29.9*  RDW 13.5 13.7 13.6  PLT 424* 451* 454*     Cardiac Enzymes Recent Labs  Lab 06/24/18 0808 06/24/18 0928 06/24/18 1511  TROPONINI 0.64* 0.60* 0.49*    Recent Labs  Lab 06/23/18 2252  TROPIPOC 0.40*     BNP Recent Labs  Lab 06/23/18 2116  BNP 335.2*     DDimer No results for input(s): DDIMER in the last 168 hours.   Radiology    No results found.  Cardiac Studies   LEFT HEART CATH AND CORONARY ANGIOGRAPHY 06/24/18  Conclusion    Ost RPDA to RPDA lesion is 40% stenosed.  RPDA-1 lesion is 50% stenosed.  RPDA-2 lesion is 80% stenosed.  Prox RCA lesion is 20% stenosed.  Mid RCA lesion is 20% stenosed.  Mid LM to Dist LM lesion is 80% stenosed.  Dist LM to Prox LAD lesion is 85% stenosed.  Prox LAD to Mid LAD lesion is 80% stenosed.  Ost 2nd Diag to 2nd Diag lesion is 95% stenosed.  Mid LAD lesion is 95% stenosed.  Dist LAD lesion is 99% stenosed.  Prox Cx to Mid Cx lesion is 60% stenosed.  LV end diastolic pressure is severely elevated.  There is moderate left ventricular systolic dysfunction.   Severe multivessel CAD with 80% distal left main stenosis, calcification of the proximal LAD with diffuse 85 to 90% stenosis before the first diagonal vessel, 80% stenoses between the first and second diagonal vessel with 95% ostial stenosis of the second diagonal and LAD beyond the diagonal vessel; the LAD is subtotally occluded in the distal LAD after the third diagonal vessel with faint filling antegrade; 60% proximal left circumflex stenosis with 50% distal circumflex marginal stenosis; large dominant RCA with 20% tandem mid RCA stenoses, and PDA stenosis of 40% ostially 50% in the mid segment and 80% distally.  Moderate LV dysfunction with an ejection fraction of 40 to 45% with hypocontractility involving the mid distal anterolateral  wall extending to the apex.  LVEDP is elevated at 29 mm.  RECOMMENDATION: Surgical consultation for CABG revascularization surgery.  Increase anti-ischemic medical  therapy. She will be started on high potency statin therapy.  With high-grade disease consider heparinization but must follow hemoglobin closely with recent decreased hemoglobin.  Recommend Aspirin 81mg  daily for CAD and with need for CABG revascularization will not start P2Y12 inhibition.    Echocardiogram 06/24/18 Study Conclusions - Left ventricle: The cavity size was normal. Wall thickness was   increased in a pattern of mild LVH. Systolic function was low   normal to mildly reduced. The estimated ejection fraction was in   the range of 50% to 55%. Anterior and anterolateral hypokinesis.   Features are consistent with a pseudonormal left ventricular   filling pattern, with concomitant abnormal relaxation and   increased filling pressure (grade 2 diastolic dysfunction). - Aortic valve: There was no stenosis. - Mitral valve: There was no significant regurgitation. - Left atrium: The atrium was mildly dilated. - Right ventricle: The cavity size was normal. Systolic function   was normal. - Pulmonary arteries: No complete TR doppler jet so unable to   estimate PA systolic pressure. - Systemic veins: IVC measured 1.8 cm with < 50% respirophasic   variation, suggesting RA pressure 8 mmHg.  Impressions: - Normal LV size with mild LV hypertrophy. EF 50-55%, anterior and   anterolateral hypokinesis. Normal RV size and systolic function.   No significant valvular abnormalities.  Patient Profile     52 y.o. female with a history of chronic diastolic heart failure on Echo in 2017, hypertension, hyperlipidemia, type 2 diabetes, CVA in 2014 and 2017, and peripheral vascular disease, who is being seen today for the evaluation of chest pain. Found to have MV CAD with moderated LV dysfunction on cath 06/24/18. Planned for CABG 10/21.  Assessment & Plan    CAD -Multivessel CAD. Planned for CABG today, second case.  -Continues on heparin. -No current chest pain -SCr improved this am to  1.15 (1.28 yesterday)  Acute on chronic CHF -lisinopril on hold for surgery. Lasix on hold as euvolemic.  -Wt down 1 pound from yesterday. Down 7 pounds since admission.  -Appears euvolemic today.   Hypertension -BP mildly elevated. Lisinopril on hold for planned surgery.   Hyperlipidemia -With CAD transitioned to high intensity statin.      For questions or updates, please contact [place] Please consult [email or web address] for contact info under        Signed, Daune Perch, NP  06/28/2018, 9:22 AM

## 2018-06-28 NOTE — Transfer of Care (Signed)
Immediate Anesthesia Transfer of Care Note  Patient: ILANA PREZIOSO  Procedure(s) Performed: CORONARY ARTERY BYPASS GRAFTING (CABG) times  four, using left internal mammary artery, endoscopically harvested right saphenous vein, and harvested left radial artery (N/A Chest) TRANSESOPHAGEAL ECHOCARDIOGRAM (TEE) (N/A ) RADIAL ARTERY HARVEST (Left Arm Lower) ENDOVEIN HARVEST OF GREATER SAPHENOUS VEIN (Right Leg Upper)  Patient Location: ICU  Anesthesia Type:General  Level of Consciousness: Patient remains intubated per anesthesia plan  Airway & Oxygen Therapy: Patient remains intubated per anesthesia plan and Patient placed on Ventilator (see vital sign flow sheet for setting)  Post-op Assessment: Report given to RN and Post -op Vital signs reviewed and stable  Post vital signs: Reviewed and stable  Last Vitals:  Vitals Value Taken Time  BP 114/78 06/28/2018  8:13 PM  Temp 36.4 C 06/28/2018  8:20 PM  Pulse 80 06/28/2018  8:20 PM  Resp 23 06/28/2018  8:20 PM  SpO2 97 % 06/28/2018  8:20 PM  Vitals shown include unvalidated device data.  Last Pain:  Vitals:   06/28/18 1100  TempSrc: Oral  PainSc:       Patients Stated Pain Goal: 0 (86/76/19 5093)  Complications: No apparent anesthesia complications

## 2018-06-28 NOTE — Brief Op Note (Signed)
06/23/2018 - 06/28/2018  8:04 PM  PATIENT:  [name]  52 y.o. female  PRE-OPERATIVE DIAGNOSIS:  CAD  POST-OPERATIVE DIAGNOSIS:  CAD   PROCEDURE:  Procedure(s): CORONARY ARTERY BYPASS GRAFTING (CABG) times  four, using left internal mammary artery, endoscopically harvested right saphenous vein, and harvested left radial artery (N/A) TRANSESOPHAGEAL ECHOCARDIOGRAM (TEE) (N/A) RADIAL ARTERY HARVEST (Left) ENDOVEIN HARVEST OF GREATER SAPHENOUS VEIN (Right)  LIMA to LAD SVG to Diag 1 and Ramus Radial to OM  SURGEON:  Surgeon(s) and Role:    * [name], MD - Primary  PHYSICIAN ASSISTANT:  Jadene Pierini, PA-C Nicholes Rough, PA-C   ANESTHESIA:   general  EBL:  500 mL   BLOOD ADMINISTERED:none  DRAINS: ROUTINE   LOCAL MEDICATIONS USED:  NONE  SPECIMEN:  No Specimen  DISPOSITION OF SPECIMEN:  N/A  COUNTS:  YES  TOURNIQUET:  * No tourniquets in log *  DICTATION: .Dragon Dictation  PLAN OF CARE: Admit to inpatient   PATIENT DISPOSITION:  ICU - intubated and hemodynamically stable.   Delay start of Pharmacological VTE agent (>24hrs) due to surgical blood loss or risk of bleeding: yes  Small poor quality targets except LAD. LAD intramyocardial NOT A CANDIDATE FOR REDO CABG

## 2018-06-28 NOTE — Progress Notes (Signed)
PROGRESS NOTE  [name]  RCV:[id number] DOB: 1965/12/31 DOA: 06/23/2018 PCP: Mosie Lukes, MD   Brief Narrative: [name] is a 52 y.o. female with a history of CVA, T2DM, PVD, chronic HFpEF, and depression with recent bereavement from her husband's death last month who presented to the ED due to chest pain and progressive exertional dyspnea for the past week. Chest pain is pressure across central/left chest that lasted about 5 minutes and abated spontaneously. She also reported some increased leg swelling, though this is waxing/waning chronically. She had unclear injury to the right 2nd toe and has a wound that does not appear infected, ABI checked and bilaterally normal. Work up in ED demonstrated inferior ST-T changes that are new and LVH with troponin elevated at 0.4. CXR demonstrated CHF pattern and BNP elevated to 335. Cardiology was consulted, troponins rose to 0.6, wall motion abnormalities were seen on echocardiogram and the patient was taken to cardiac catheterization 10/17 which revealed proximal left main and LAD disease as well as distal RCA stenosis. Cardiothoracic surgery was consulted and after discussion with the patient, recommend CABG 10/21. She is maintained on heparin gtt.  Assessment & Plan: Principal Problem:   Non-ST elevation (NSTEMI) myocardial infarction [facility]) Active Problems:   Essential hypertension, benign   Anemia of chronic disease   Chest pain   Type 2 diabetes mellitus with vascular disease (HCC)   Acute on chronic diastolic CHF (congestive heart failure) (HCC)   Coronary artery disease involving native coronary artery of native heart with angina pectoris (HCC)  NSTEMI, severe diffuse CAD:  - Plan CABG 10/21. - Continue heparin, ASA, no plavix - High-intensity statin, BB  Acute on chronic combined HFrEF: EF by cath slightly reduced, G2DD on echo.  - Initial diuresis improved symptoms, but renal function impaired. Stopped ACE inhibitor -  Continue BB  - Daily weights, strict I/O, NPO   IDT2DM with insensate peripheral neuropathy: Poorly controlled with hyperglycemia and HbA1c 11.1% (worsened from prior). Significant insulin use at home but not entirely clear what she takes. Prescribed 100u qAM and 50u qPM, pt usually takes 50u and 50u depending on blood sugar checks.  - Decreased lantus this morning. Further decreased by CT surgery, will defer to them going forward.  Right toe wound: Needs very close monitoring in pt with history of PVD and former toe amputation. ABI normal bilaterally - Local wound care, tight glucose control  Iron deficiency anemia: Hgb 9.7 > 8.3 and seems to have stabilized. - Iron and % sat low, ferritin on low end of normal. Started po iron. No colonoscopy reports in EMR. - T&S in the event transfusion required. FOBT ordered.  History of CVA:  - ASA, statin   Hypokalemia:  - Over replaced and now K is 5.3. No telemetry changes.   Grief and depression:  - Support provided - Continue SSRI - STrongly recommend outpatient counseling services.  Hypothyroidism: TSH 2.674 - Continue low dose synthroid  DVT prophylaxis: Heparin gtt Code Status: Full Family Communication: Son at bedside Disposition Plan: Uncertain, pending postoperative course. Anticipate transition to cardiothoracic surgery service following CABG.  Consultants:   Cardiology  Cardiothoracic surgery  Procedures:  CARDIAC CATH: 06/24/2018  Ost RPDA to RPDA lesion is 40% stenosed.  RPDA-1 lesion is 50% stenosed.  RPDA-2 lesion is 80% stenosed.  Prox RCA lesion is 20% stenosed.  Mid RCA lesion is 20% stenosed.  Mid LM to Dist LM lesion is 80% stenosed.  Dist LM to Prox LAD  lesion is 85% stenosed.  Prox LAD to Mid LAD lesion is 80% stenosed.  Ost 2nd Diag to 2nd Diag lesion is 95% stenosed.  Mid LAD lesion is 95% stenosed.  Dist LAD lesion is 99% stenosed.  Prox Cx to Mid Cx lesion is 60% stenosed.  LV end  diastolic pressure is severely elevated.  There is moderate left ventricular systolic dysfunction.  Severe multivessel CAD with 80% distal left main stenosis, calcification of the proximal LAD with diffuse 85 to 90% stenosis before the first diagonal vessel, 80% stenoses between the first and second diagonal vessel with 95% ostial stenosis of the second diagonal and LAD beyond the diagonal vessel; the LAD is subtotally occluded in the distal LAD after the third diagonal vessel with faint filling antegrade; 60% proximal left circumflex stenosis with 50% distal circumflex marginal stenosis; large dominant RCA with 20% tandem mid RCA stenoses, and PDA stenosis of 40% ostially 50% in the mid segment and 80% distally.  Moderate LV dysfunction with an ejection fraction of 40 to 45% with hypocontractility involving the mid distal anterolateral wall extending to the apex. LVEDP is elevated at 29 mm.  RECOMMENDATION: Surgical consultation for CABG revascularization surgery. Increase anti-ischemic medical therapy. She will be started on high potency statin therapy. With high-grade disease consider heparinization but must follow hemoglobin closely with recent decreased hemoglobin.  Recommend Aspirin 81mg  daily for CAD and with need for CABG revascularization will not start P2Y12 inhibition.  ECHO:  06/24/2018 - Left ventricle: The cavity size was normal. Wall thickness was increased in a pattern of mild LVH. Systolic function was low normal to mildly reduced. The estimated ejection fraction was in the range of 50% to 55%. Anterior and anterolateral hypokinesis. Features are consistent with a pseudonormal left ventricular filling pattern, with concomitant abnormal relaxation and increased filling pressure (grade 2 diastolic dysfunction). - Aortic valve: There was no stenosis. - Mitral valve: There was no significant regurgitation. - Left atrium: The atrium was mildly dilated. - Right  ventricle: The cavity size was normal. Systolic function was normal. - Pulmonary arteries: No complete TR doppler jet so unable to estimate PA systolic pressure. - Systemic veins: IVC measured 1.8 cm with < 50% respirophasic variation, suggesting RA pressure 8 mmHg.  Impressions: - Normal LV size with mild LV hypertrophy. EF 50-55%, anterior and anterolateral hypokinesis. Normal RV size and systolic function. No significant valvular abnormalities.  CABG planned 06/28/2018  Subjective: Nervous about surgery but comfortable with plan. No chest pain or dyspnea currently. No bleeding.  Objective: BP (!) 157/81 (BP Location: Right Arm)   Pulse 70   Temp 98.6 F (37 C) (Oral)   Resp 14   Ht 5\' 2"  (1.575 m)   Wt 76.4 kg   LMP 02/12/2011 (Exact Date)   SpO2 98%   BMI 30.82 kg/m   Gen: 52 y.o. female in no distress Pulm: Nonlabored breathing room air. Clear. CV: Regular rate and rhythm. No murmur, rub, or gallop. No JVD, no dependent edema.  CBC: Recent Labs  Lab 06/23/18 2116  06/25/18 0357 06/26/18 0502 06/27/18 0015 06/27/18 0437 06/28/18 0400  WBC 11.1*   < > 8.3 8.3 9.0 8.5 9.5  NEUTROABS 9.0*  --   --   --   --   --   --   HGB 9.7*   < > 8.4* 8.6* 8.0* 8.2* 8.5*  HCT 32.4*   < > 26.7* 27.9* 26.0* 27.2* 28.4*  MCV 86.9   < > 85.3 86.4  87.0 86.9 87.9  PLT 570*   < > 459* 453* 424* 451* 454*   < > = values in this interval not displayed.   Basic Metabolic Panel: Recent Labs  Lab 06/24/18 0928 06/25/18 0357 06/26/18 0502 06/27/18 0015 06/27/18 0437 06/28/18 0400  NA 142 141 139 141 141 139  K 3.5 3.2* 3.3* 4.1 4.6 5.3*  CL 108 108 102 107 106 108  CO2 23 24 29 27 28 27   GLUCOSE 129* 48* 160* 124* 119* 160*  BUN 19 16 24* 29* 26* 25*  CREATININE 1.08* 0.92 1.23* 1.29* 1.28* 1.15*  CALCIUM 8.5* 8.5* 8.6* 8.6* 8.6* 9.0  MG 1.8  --   --   --  2.1  --    Liver Function Tests: Recent Labs  Lab 06/23/18 2116 06/24/18 0928  AST 17 22  ALT 21 20    ALKPHOS 98 87  BILITOT 0.5 0.6  PROT 7.1 6.9  ALBUMIN 2.6* 2.6*   Cardiac Enzymes: Recent Labs  Lab 06/24/18 0808 06/24/18 0928 06/24/18 1511  TROPONINI 0.64* 0.60* 0.49*    Time spent: 15 minutes.  Patrecia Pour, MD [facility] [email or web address] Password [facility] 06/28/2018, 10:42 AM

## 2018-06-28 NOTE — Anesthesia Procedure Notes (Addendum)
Arterial Line Insertion Start/End10/21/2019 12:55 PM, 06/28/2018 1:10 PM Performed by: Roberts Gaudy, MD, Milford Cage, CRNA, CRNA  Patient location: Pre-op. Preanesthetic checklist: patient identified, IV checked, site marked, risks and benefits discussed, surgical consent, monitors and equipment checked, pre-op evaluation, timeout performed and anesthesia consent Lidocaine 1% used for infiltration Right, radial was placed Catheter size: 20 Fr Hand hygiene performed  and maximum sterile barriers used   Attempts: 1 Procedure performed without using ultrasound guided technique. Following insertion, dressing applied. Post procedure assessment: normal and unchanged  Patient tolerated the procedure well with no immediate complications.

## 2018-06-28 NOTE — Care Management Note (Signed)
Case Management Note  Patient Details  Name: [name] MRN: [id number] Date of Birth: August 05, 1966  Subjective/Objective:   Pt presented for Nstemi- Plan for CABG 06-28-18. PTA from home alone- per patient her son will be staying with her post hospitalization.                  Action/Plan: CM will continue to monitor for additional needs.   Expected Discharge Date:                  Expected Discharge Plan:  [place]  In-House Referral:  NA  Discharge planning Services  CM Consult  Post Acute Care Choice:    Choice offered to:     DME Arranged:    DME Agency:     HH Arranged:    HH Agency:     Status of Service:  In process, will continue to follow  If discussed at Long Length of Stay Meetings, dates discussed:    Additional Comments:  Bethena Roys, RN 06/28/2018, 10:25 AM

## 2018-06-28 NOTE — Anesthesia Procedure Notes (Signed)
Central Venous Catheter Insertion Performed by: Roberts Gaudy, MD, anesthesiologist Start/End10/21/2019 1:20 PM, 06/28/2018 1:30 PM Patient location: Pre-op. Preanesthetic checklist: patient identified, IV checked, site marked, risks and benefits discussed, surgical consent, monitors and equipment checked, pre-op evaluation, timeout performed and anesthesia consent Hand hygiene performed  and maximum sterile barriers used  Catheter size: 9 Fr PA cath was placed.Swan type:thermodilution Procedure performed without using ultrasound guided technique. Attempts: 1 Following insertion, line sutured, dressing applied and Biopatch. Post procedure assessment: blood return through all ports and free fluid flow  Patient tolerated the procedure well with no immediate complications.

## 2018-06-28 NOTE — Interval H&P Note (Signed)
History and Physical Interval Note:  06/28/2018 1:28 PM  [name]  has presented today for surgery, with the diagnosis of CAD  The various methods of treatment have been discussed with the patient and family. After consideration of risks, benefits and other options for treatment, the patient has consented to  Procedure(s): CORONARY ARTERY BYPASS GRAFTING (CABG) (N/A) TRANSESOPHAGEAL ECHOCARDIOGRAM (TEE) (N/A) RADIAL ARTERY HARVEST (Left) as a surgical intervention .  The patient's history has been reviewed, patient examined, no change in status, stable for surgery.  I have reviewed the patient's chart and labs.  Questions were answered to the patient's satisfaction.     [name]

## 2018-06-28 NOTE — Anesthesia Procedure Notes (Signed)
Procedure Name: Intubation Performed by: Mariea Clonts, CRNA Pre-anesthesia Checklist: Patient identified, Emergency Drugs available, Suction available and Patient being monitored Patient Re-evaluated:Patient Re-evaluated prior to induction Oxygen Delivery Method: Circle System Utilized Preoxygenation: Pre-oxygenation with 100% oxygen Induction Type: IV induction Ventilation: Mask ventilation without difficulty Laryngoscope Size: Mac and 3 Grade View: Grade I Tube type: Oral Tube size: 7.5 mm Number of attempts: 1 Airway Equipment and Method: Stylet and Oral airway Placement Confirmation: ETT inserted through vocal cords under direct vision,  positive ETCO2 and breath sounds checked- equal and bilateral Secured at: 19 cm Tube secured with: Tape Dental Injury: Teeth and Oropharynx as per pre-operative assessment

## 2018-06-28 NOTE — Progress Notes (Signed)
Dr. Harriet Pho notified of nasal swab Staph aureus positive.  Also reviewed morning lantus insulin dose, will order 15units.

## 2018-06-29 ENCOUNTER — Encounter (HOSPITAL_COMMUNITY): Payer: Self-pay | Admitting: Thoracic Surgery (Cardiothoracic Vascular Surgery)

## 2018-06-29 ENCOUNTER — Inpatient Hospital Stay (HOSPITAL_COMMUNITY): Payer: BLUE CROSS/BLUE SHIELD

## 2018-06-29 DIAGNOSIS — Z951 Presence of aortocoronary bypass graft: Secondary | ICD-10-CM

## 2018-06-29 LAB — CREATININE, SERUM
Creatinine, Ser: 1.27 mg/dL — ABNORMAL HIGH (ref 0.44–1.00)
GFR calc Af Amer: 54 mL/min — ABNORMAL LOW (ref >60)
GFR calc non Af Amer: 47 mL/min — ABNORMAL LOW (ref >60)

## 2018-06-29 LAB — POCT I-STAT 3, ART BLOOD GAS (G3+)
Acid-Base Excess: 1 mmol/L (ref 0.0–2.0)
Acid-Base Excess: 2 mmol/L (ref 0.0–2.0)
Acid-base deficit: 3 mmol/L — ABNORMAL HIGH (ref 0.0–2.0)
Bicarbonate: 24.5 mmol/L (ref 20.0–28.0)
Bicarbonate: 24.6 mmol/L (ref 20.0–28.0)
Bicarbonate: 22.1 mmol/L (ref 20.0–28.0)
Bicarbonate: 24.5 mmol/L (ref 20.0–28.0)
Bicarbonate: 26.3 mmol/L (ref 20.0–28.0)
O2 Saturation: 95 %
O2 Saturation: 97 %
O2 Saturation: 97 %
O2 Saturation: 98 %
O2 Saturation: 99 %
Patient temperature: 36.1
Patient temperature: 37.3
Patient temperature: 37.3
Patient temperature: 37
Patient temperature: 36.8
TCO2: 25 mmol/L (ref 22–32)
TCO2: 26 mmol/L (ref 22–32)
TCO2: 23 mmol/L (ref 22–32)
TCO2: 26 mmol/L (ref 22–32)
TCO2: 28 mmol/L (ref 22–32)
pCO2 arterial: 32.7 mmHg (ref 32.0–48.0)
pCO2 arterial: 39 mmHg (ref 32.0–48.0)
pCO2 arterial: 39.8 mmHg (ref 32.0–48.0)
pCO2 arterial: 38.4 mmHg (ref 32.0–48.0)
pCO2 arterial: 40.3 mmHg (ref 32.0–48.0)
pH, Arterial: 7.479 — ABNORMAL HIGH (ref 7.350–7.450)
pH, Arterial: 7.409 (ref 7.350–7.450)
pH, Arterial: 7.354 (ref 7.350–7.450)
pH, Arterial: 7.413 (ref 7.350–7.450)
pH, Arterial: 7.422 (ref 7.350–7.450)
pO2, Arterial: 65 mmHg — ABNORMAL LOW (ref 83.0–108.0)
pO2, Arterial: 92 mmHg (ref 83.0–108.0)
pO2, Arterial: 92 mmHg (ref 83.0–108.0)
pO2, Arterial: 99 mmHg (ref 83.0–108.0)
pO2, Arterial: 120 mmHg — ABNORMAL HIGH (ref 83.0–108.0)

## 2018-06-29 LAB — BASIC METABOLIC PANEL
Anion gap: 7 (ref 5–15)
BUN: 20 mg/dL (ref 6–20)
CO2: 23 mmol/L (ref 22–32)
Calcium: 8.3 mg/dL — ABNORMAL LOW (ref 8.9–10.3)
Chloride: 106 mmol/L (ref 98–111)
Creatinine, Ser: 1.12 mg/dL — ABNORMAL HIGH (ref 0.44–1.00)
GFR calc Af Amer: >60 mL/min (ref >60)
GFR calc non Af Amer: 55 mL/min — ABNORMAL LOW (ref >60)
Glucose, Bld: 160 mg/dL — ABNORMAL HIGH (ref 70–99)
Potassium: 4.7 mmol/L (ref 3.5–5.1)
Sodium: 136 mmol/L (ref 135–145)

## 2018-06-29 LAB — GLUCOSE, CAPILLARY
Glucose-Capillary: 102 mg/dL — ABNORMAL HIGH (ref 70–99)
Glucose-Capillary: 102 mg/dL — ABNORMAL HIGH (ref 70–99)
Glucose-Capillary: 106 mg/dL — ABNORMAL HIGH (ref 70–99)
Glucose-Capillary: 106 mg/dL — ABNORMAL HIGH (ref 70–99)
Glucose-Capillary: 107 mg/dL — ABNORMAL HIGH (ref 70–99)
Glucose-Capillary: 110 mg/dL — ABNORMAL HIGH (ref 70–99)
Glucose-Capillary: 110 mg/dL — ABNORMAL HIGH (ref 70–99)
Glucose-Capillary: 122 mg/dL — ABNORMAL HIGH (ref 70–99)
Glucose-Capillary: 124 mg/dL — ABNORMAL HIGH (ref 70–99)
Glucose-Capillary: 128 mg/dL — ABNORMAL HIGH (ref 70–99)
Glucose-Capillary: 129 mg/dL — ABNORMAL HIGH (ref 70–99)
Glucose-Capillary: 134 mg/dL — ABNORMAL HIGH (ref 70–99)
Glucose-Capillary: 142 mg/dL — ABNORMAL HIGH (ref 70–99)
Glucose-Capillary: 152 mg/dL — ABNORMAL HIGH (ref 70–99)
Glucose-Capillary: 86 mg/dL (ref 70–99)
Glucose-Capillary: 97 mg/dL (ref 70–99)

## 2018-06-29 LAB — CBC
HCT: 23.9 % — ABNORMAL LOW (ref 36.0–46.0)
HCT: 24.6 % — ABNORMAL LOW (ref 36.0–46.0)
Hemoglobin: 7.3 g/dL — ABNORMAL LOW (ref 12.0–15.0)
Hemoglobin: 7.5 g/dL — ABNORMAL LOW (ref 12.0–15.0)
MCH: 25.9 pg — ABNORMAL LOW (ref 26.0–34.0)
MCH: 26.4 pg (ref 26.0–34.0)
MCHC: 30.5 g/dL (ref 30.0–36.0)
MCHC: 30.5 g/dL (ref 30.0–36.0)
MCV: 84.8 fL (ref 80.0–100.0)
MCV: 86.6 fL (ref 80.0–100.0)
Platelets: 260 10*3/uL (ref 150–400)
Platelets: 276 10*3/uL (ref 150–400)
RBC: 2.82 MIL/uL — ABNORMAL LOW (ref 3.87–5.11)
RBC: 2.84 MIL/uL — ABNORMAL LOW (ref 3.87–5.11)
RDW: 14.6 % (ref 11.5–15.5)
RDW: 15.1 % (ref 11.5–15.5)
WBC: 13.4 10*3/uL — ABNORMAL HIGH (ref 4.0–10.5)
WBC: 16.9 10*3/uL — ABNORMAL HIGH (ref 4.0–10.5)
nRBC: 0 % (ref 0.0–0.2)
nRBC: 0 % (ref 0.0–0.2)

## 2018-06-29 LAB — POCT I-STAT, CHEM 8
BUN: 23 mg/dL — ABNORMAL HIGH (ref 6–20)
BUN: 23 mg/dL — ABNORMAL HIGH (ref 6–20)
Calcium, Ion: 1.15 mmol/L (ref 1.15–1.40)
Calcium, Ion: 1.14 mmol/L — ABNORMAL LOW (ref 1.15–1.40)
Chloride: 102 mmol/L (ref 98–111)
Chloride: 103 mmol/L (ref 98–111)
Creatinine, Ser: 1.1 mg/dL — ABNORMAL HIGH (ref 0.44–1.00)
Creatinine, Ser: 1.2 mg/dL — ABNORMAL HIGH (ref 0.44–1.00)
Glucose, Bld: 161 mg/dL — ABNORMAL HIGH (ref 70–99)
Glucose, Bld: 115 mg/dL — ABNORMAL HIGH (ref 70–99)
HCT: 20 % — ABNORMAL LOW (ref 36.0–46.0)
HCT: 26 % — ABNORMAL LOW (ref 36.0–46.0)
Hemoglobin: 6.8 g/dL — CL (ref 12.0–15.0)
Hemoglobin: 8.8 g/dL — ABNORMAL LOW (ref 12.0–15.0)
Potassium: 4.8 mmol/L (ref 3.5–5.1)
Potassium: 4.5 mmol/L (ref 3.5–5.1)
Sodium: 138 mmol/L (ref 135–145)
Sodium: 137 mmol/L (ref 135–145)
TCO2: 25 mmol/L (ref 22–32)
TCO2: 24 mmol/L (ref 22–32)

## 2018-06-29 LAB — MAGNESIUM
Magnesium: 3 mg/dL — ABNORMAL HIGH (ref 1.7–2.4)
Magnesium: 2.8 mg/dL — ABNORMAL HIGH (ref 1.7–2.4)

## 2018-06-29 LAB — POCT I-STAT 4, (NA,K, GLUC, HGB,HCT)
Glucose, Bld: 131 mg/dL — ABNORMAL HIGH (ref 70–99)
HCT: 25 % — ABNORMAL LOW (ref 36.0–46.0)
Hemoglobin: 8.5 g/dL — ABNORMAL LOW (ref 12.0–15.0)
Potassium: 4.7 mmol/L (ref 3.5–5.1)
Sodium: 139 mmol/L (ref 135–145)

## 2018-06-29 MED ORDER — CHLORHEXIDINE GLUCONATE 0.12% ORAL RINSE (MEDLINE KIT)
15.0000 mL | Freq: Two times a day (BID) | OROMUCOSAL | Status: DC
  Administered 2018-06-29: 15 mL via OROMUCOSAL

## 2018-06-29 MED ORDER — ESCITALOPRAM OXALATE 10 MG PO TABS
10.0000 mg | ORAL_TABLET | Freq: Every day | ORAL | Status: DC
  Administered 2018-06-29 – 2018-07-06 (×8): 10 mg via ORAL
  Filled 2018-06-29 (×8): qty 1

## 2018-06-29 MED ORDER — ENOXAPARIN SODIUM 40 MG/0.4ML ~~LOC~~ SOLN
40.0000 mg | Freq: Every day | SUBCUTANEOUS | Status: DC
  Administered 2018-06-30 – 2018-07-05 (×7): 40 mg via SUBCUTANEOUS
  Filled 2018-06-29 (×7): qty 0.4

## 2018-06-29 MED ORDER — LEVOTHYROXINE SODIUM 25 MCG PO TABS
25.0000 ug | ORAL_TABLET | Freq: Every day | ORAL | Status: DC
  Administered 2018-06-30 – 2018-07-06 (×7): 25 ug via ORAL
  Filled 2018-06-29 (×7): qty 1

## 2018-06-29 MED ORDER — SODIUM CHLORIDE 0.9% FLUSH
10.0000 mL | Freq: Two times a day (BID) | INTRAVENOUS | Status: DC
  Administered 2018-06-29 – 2018-07-02 (×7): 10 mL

## 2018-06-29 MED ORDER — SODIUM CHLORIDE 0.9% FLUSH: 10.0000 mL | INTRAVENOUS | Status: DC | PRN

## 2018-06-29 MED ORDER — GABAPENTIN 300 MG PO CAPS
300.0000 mg | ORAL_CAPSULE | Freq: Three times a day (TID) | ORAL | Status: DC
  Administered 2018-06-29 – 2018-07-06 (×22): 300 mg via ORAL
  Filled 2018-06-29 (×22): qty 1

## 2018-06-29 MED ORDER — AZELASTINE HCL 0.1 % NA SOLN: 2.0000 | Freq: Two times a day (BID) | NASAL | Status: DC | PRN

## 2018-06-29 MED ORDER — INSULIN DETEMIR 100 UNIT/ML ~~LOC~~ SOLN
30.0000 [IU] | Freq: Two times a day (BID) | SUBCUTANEOUS | Status: DC
  Administered 2018-06-29: 30 [IU] via SUBCUTANEOUS
  Filled 2018-06-29 (×4): qty 0.3

## 2018-06-29 MED ORDER — CHLORHEXIDINE GLUCONATE CLOTH 2 % EX PADS
6.0000 | MEDICATED_PAD | Freq: Every day | CUTANEOUS | Status: DC
  Administered 2018-06-29 – 2018-07-01 (×3): 6 via TOPICAL

## 2018-06-29 MED ORDER — ORAL CARE MOUTH RINSE
15.0000 mL | OROMUCOSAL | Status: DC
  Administered 2018-06-29 (×3): 15 mL via OROMUCOSAL

## 2018-06-29 MED ORDER — INSULIN ASPART 100 UNIT/ML ~~LOC~~ SOLN
0.0000 [IU] | SUBCUTANEOUS | Status: DC
  Administered 2018-06-29 – 2018-06-30 (×2): 2 [IU] via SUBCUTANEOUS

## 2018-06-29 MED FILL — Heparin Sodium (Porcine) Inj 1000 Unit/ML: INTRAMUSCULAR | Qty: 30 | Status: AC

## 2018-06-29 MED FILL — Magnesium Sulfate Inj 50%: INTRAMUSCULAR | Qty: 10 | Status: AC

## 2018-06-29 MED FILL — Potassium Chloride Inj 2 mEq/ML: INTRAVENOUS | Qty: 40 | Status: AC

## 2018-06-29 NOTE — Op Note (Signed)
NAME: [name], LISENBY MEDICAL RECORD CZ:[id number] ACCOUNT [id number] DATE OF BIRTH:1966-07-14 FACILITY: [facility] LOCATION: [facility] PHYSICIAN:Makia Bossi Chaya Jan, MD  OPERATIVE REPORT  DATE OF PROCEDURE:  06/28/2018  PREOPERATIVE DIAGNOSIS:  Severe 3-vessel coronary disease.  POSTOPERATIVE DIAGNOSIS:  Severe 3-vessel coronary disease.  PROCEDURES:  Median sternotomy, extracorporeal circulation Coronary artery bypass grafting x 4  Left internal mammary artery to left anterior descending,  Saphenous vein graft to first diagonal and an anterolateral 1,  Left radial artery to OM1  Endoscopic vein harvest right thigh  SURGEON:  Modesto Charon, MD  ASSISTANT:  Jadene Pierini, [place]:  Nicholes Rough, PA-C.  ANESTHESIA:  General.  FINDINGS:  Small, poor quality targets except LAD.  LAD deeply intramyocardial.  Conduits- small but good quality. NOT a candidate for redo bypass grafting.  CLINICAL NOTE:  The patient is a 52 year old woman who presented with shortness of breath and chest pain.  She ruled in for a non-ST elevation MI and was in congestive heart failure, class IV with a BNP of 335.  She underwent cardiac catheterization,  which revealed severe 3-vessel coronary disease and also left main disease.  She was referred for coronary bypass grafting.  Coronary artery bypass grafting is indicated for survival benefit and relief of symptoms.  The indications, risks, benefits, and  alternatives were discussed in detail with the patient.  She understood and accepted the risks and agreed to proceed.  DESCRIPTION OF PROCEDURE:  The patient was brought to the preoperative holding area on 06/28/2018.  Anesthesia placed a Swan-Ganz catheter and an arterial blood pressure monitoring line. The Allen's test opn the left arm was confirmed to be normal. She was taken to the Operating Room, anesthetized and intubated.   Intravenous antibiotics were administered.  A Foley catheter  was placed.  Transesophageal echocardiography was performed by  Dr. Roberts Gaudy.  Please see his separately dictated note for full details of the procedure.  There was some anterior apical hypokinesis, but otherwise unremarkable transesophageal echocardiogram.  The chest, abdomen, legs and left arm were prepped and  draped in the usual sterile fashion.  An incision was made of over the volar aspect of the left wrist.  The distal portion of the radial artery was dissected out. With proximal occlusion there was a good pulse distally and a good Doppler signal in the palmar arch.  The incision was extended to just  below the antecubital fossa and the radial artery was harvested using the Harmonic scalpel.  The radial artery was a good quality vessel.  Simultaneously with the radial harvest, a median sternotomy was performed and the left internal mammary artery was harvested using  standard technique.  This was difficult due to the patient's body habitus, but the mammary artery had excellent flow when divided distally.  Heparin 2000 units was administered during the vessel harvest.  The left arm incision was closed and the arm was  tucked to the patient's side.  A segment of saphenous vein was harvested from the right thigh endoscopically.  This was also relatively small but a good quality conduit.  The remainder of the full heparin dose was given.  The pericardium was opened.  The  ascending aorta was inspected.  There was no evidence of atherosclerotic disease.  Adequate anticoagulation was confirmed with ACT measurement.  The aorta was cannulated via concentric 2-0 Ethibond pledgeted pursestring sutures.  A dual stage venous  cannula was placed via a pursestring suture in the right atrial appendage.  Cardiopulmonary  bypass was initiated.  Flows were maintained per protocol.  The patient was cooled to 32 degrees Celsius.  The coronary arteries were inspected and anastomotic  sites were chosen.  The LAD was  intramyocardial and was not dissected out at this time. Both the first diagonal and anterolateral one were superficially intramyocardial.  The OM2 was a normal epicardial vessel.  All the vessels were relatively small.  A  foam pad was placed in the pericardium to insulate the heart.  A temperature probe was placed in the myocardial septum.  The grafts were prepared.  A cardioplegia cannula was placed in the ascending aorta.  The aorta was cross clamped.  The left ventricle was emptied via the aortic root vent.  Cardiac arrest was achieved with a combination of cold antegrade blood cardioplegia and topical iced saline; 1.5 liters of cardioplegia was administered.  There was a  rapid diastolic arrest and there was septal cooling to 10 degrees Celsius.  The heart was elevated, exposing the lateral wall.  The distal end of the left radial artery was bevelled.  It was anastomosed end-to-side to the obtuse marginal branch of the circumflex.  This was a 1.5 mm vessel. It accepted a 1.5 mm probe right at the  anastomosis, but tapered down to 1 mm just beyond that.  It was a poor quality as were the anterolateral branches.  The radial was of good quality.  It was anastomosed end-to-side with a running 8-0 Prolene suture.  At the completion of the anastomosis,  the patient was given additional cardioplegia down the aortic root.  There was good backbleeding from the radial artery.  The anterolateral wall then was exposed.  The first diagonal branch was suitable for grafting as well as the first anterolateral branch.  It was unclear if this was a ramus branch or an OM that was totally occluded proximally.  The second diagonal branch was  too small to graft.  Both these target vessels were poor quality vessels, but did accept a 1 mm probe and were both graftable.  An side-to-side anastomosis was performed to the first diagonal and an end-to-side to the first anterolateral. Cardioplegia was  administered down the  graft.  There was good flow through the graft and good hemostasis.  Additional cardioplegia was administered down the aortic root.  The left anterior descending then was dissected out.  It was deeply intramyocardial.  It was a 1.5 mm vessel.  It was far better quality than the other target vessels.  The distal end of the  left mammary was bevelled.  It was anastomosed end-to-side with a running 8-0 Prolene suture.  At the completion of the mammary to LAD anastomosis, the bulldog clamp was briefly removed.  Rapid septal rewarming was noted.  The bulldog clamp was  replaced.  The mammary pedicle was tacked to the epicardial surface of the heart with 6-0 Prolene sutures.  Additional cardioplegia was administered.  The cardioplegia cannula was removed from the ascending aorta.  The proximal anastomosis for the vein graft was performed to a 4.5 mm punch aortotomy with a running 6-0 Prolene suture.  A branch near the  proximal end of the vein was excised and the proximal end of the radial artery was anastomosed end-to-side to the vein to the diagonal and anterolateral with a running 7-0 Prolene suture.  The patient was placed in Trendelenburg position.  The bulldog  clamp was again removed from the left mammary artery.  Septal rewarming was again noted.  The aortic root was deaired and the aortic crossclamp was removed.  The total crossclamp time was 93 minutes.  The patient spontaneously converted sinus rhythm and  did not require defibrillation.  While rewarming was completed, all proximal and distal anastomoses were inspected for hemostasis.  Epicardial pacing wires were placed on the right ventricle and right atrium.  DDD pacing was initiated initially 80 beats  and then 90 beats per minute.  The patient had relatively low urine output while on pump and dopamine infusion had been started at 3 mcg/kg/minute during the bypass run.  This was left at the time of weaning from bypass.  When the patient had  rewarmed to  a core temperature of 37 degrees Celsius, she was weaned from bypass on the first attempt.  Total bypass time was 125 minutes.  The initial cardiac index was greater than 2 liters per minute per meter squared.  The patient remained hemodynamically  stable throughout the post-bypass period.  A test dose of protamine was administered and was well tolerated the atrial and aortic cannulae were removed.  Post-bypass transesophageal echocardiography was essentially unchanged from the pre-bypass study.  The remainder of the protamine was  administered without incident.  The chest was irrigated with warm saline.  Hemostasis was achieved.  The pericardium was reapproximated over the ascending aorta.  The pericardium was not reapproximated over the heart.  Left pleural and mediastinal chest  tubes were placed through separate subcostal incisions and secured with #1 silk sutures.  The sternum was closed with a combination of single and double heavy gauge stainless steel wires.  Pectoralis fascia, subcutaneous tissue and skin were closed in  standard fashion.  All sponge, needle and instrument counts were correct at the end of the procedure.  The patient was taken from the operating room to the surgical Intensive Care Unit, intubated and in fair condition.  AN/NUANCE  D:06/28/2018 T:06/29/2018 JOB:[id number]

## 2018-06-29 NOTE — Progress Notes (Signed)
RT placed pt on rapid wean at this time. RN alerted RT to try wean process at [place].

## 2018-06-29 NOTE — Progress Notes (Signed)
MD Roxan Hockey was notified that patient doesn't have a cuff leak and stated to hold off on extubation and wait until the morning. Patient is placed back on previous settings with a Rate of 12. Patient is resting comfortable at this time.

## 2018-06-29 NOTE — Progress Notes (Signed)
Pulmonary mechanics were done with good effort.   NIF: -32 FVC: 1.1L

## 2018-06-29 NOTE — Plan of Care (Signed)
Received patient, currently weaning to extubate.  Patient follows commands but still remains too drowsy at this time.  Following protocol.

## 2018-06-29 NOTE — Progress Notes (Addendum)
Progress Note  Patient Name: [name] Date of Encounter: 06/29/2018  Primary Cardiologist: Elouise Munroe, MD   Subjective   Extubated earlier this morning.  Nurses at bedside currently.  High-pitched moaning at times.  Inpatient Medications    Scheduled Meds: . acetaminophen  1,000 mg Oral Q6H   Or  . acetaminophen (TYLENOL) oral liquid 160 mg/5 mL  1,000 mg Per Tube Q6H  . acetaminophen (TYLENOL) oral liquid 160 mg/5 mL  650 mg Per Tube Once   Or  . acetaminophen  650 mg Rectal Once  . aspirin EC  325 mg Oral Daily   Or  . aspirin  324 mg Per Tube Daily  . atorvastatin  80 mg Oral q1800  . bisacodyl  10 mg Oral Daily   Or  . bisacodyl  10 mg Rectal Daily  . chlorhexidine gluconate (MEDLINE KIT)  15 mL Mouth Rinse BID  . Chlorhexidine Gluconate Cloth  6 each Topical Daily  . docusate sodium  200 mg Oral Daily  . enoxaparin (LOVENOX) injection  40 mg Subcutaneous QHS  . escitalopram  10 mg Oral Daily  . gabapentin  300 mg Oral TID  . insulin aspart  0-24 Units Subcutaneous Q4H  . insulin detemir  30 Units Subcutaneous BID  . insulin regular  0-10 Units Intravenous TID WC  . levothyroxine  25 mcg Oral QAC breakfast  . mouth rinse  15 mL Mouth Rinse 10 times per day  . metoprolol tartrate  12.5 mg Oral BID   Or  . metoprolol tartrate  12.5 mg Per Tube BID  . mupirocin ointment  1 application Nasal BID  . mupirocin ointment   Topical Daily  . [START ON 06/30/2018] pantoprazole  40 mg Oral Daily  . sodium chloride flush  10-40 mL Intracatheter Q12H  . sodium chloride flush  3 mL Intravenous Q12H   Continuous Infusions: . sodium chloride 20 mL/hr at 06/29/18 0900  . sodium chloride 250 mL (06/29/18 0357)  . sodium chloride 10 mL/hr at 06/28/18 2206  . albumin human 12.5 g (06/28/18 2205)  . cefUROXime (ZINACEF)  IV Stopped (06/29/18 0041)  . dexmedetomidine (PRECEDEX) IV infusion Stopped (06/28/18 2300)  . DOPamine 2 mcg/kg/min (06/29/18 0900)  .  famotidine (PEPCID) IV 20 mg (06/28/18 2034)  . insulin 1.1 mL/hr at 06/29/18 0900  . lactated ringers Stopped (06/28/18 2103)  . lactated ringers 20 mL/hr at 06/29/18 0900  . nitroGLYCERIN 50 mcg/min (06/29/18 0900)  . phenylephrine (NEO-SYNEPHRINE) Adult infusion Stopped (06/28/18 2339)   PRN Meds: sodium chloride, albumin human, azelastine, fentaNYL (SUBLIMAZE) injection, metoprolol tartrate, midazolam, ondansetron (ZOFRAN) IV, oxyCODONE, sodium chloride flush, sodium chloride flush, traMADol   Vital Signs    Vitals:   06/29/18 0800 06/29/18 0805 06/29/18 0830 06/29/18 0901  BP: 115/72 133/61 (!) 163/69 121/62  Pulse: 72 71 73 70  Resp: '12 20 18 14  '$ Temp: 98.4 F (36.9 C)     TempSrc: Core     SpO2: 99% 99% 99% 100%  Weight:      Height:        Intake/Output Summary (Last 24 hours) at 06/29/2018 1151 Last data filed at 06/29/2018 0900 Gross per 24 hour  Intake 5430.53 ml  Output 3800 ml  Net 1630.53 ml   Filed Weights   06/28/18 0516 06/28/18 1228 06/29/18 0500  Weight: 76.4 kg 76.4 kg 80.4 kg    Telemetry    Sinus rhythm- Personally Reviewed  ECG  Sinus 72 nonspecific ST changes- Personally Reviewed  Physical Exam   GEN: No acute distress, high-pitched moaning.   Neck: No JVD Cardiac: RRR, no murmurs, rubs, or gallops.  Respiratory: Clear to auscultation bilaterally. GI: Soft, nontender, non-distended  MS: No edema; No deformity. Neuro:  Nonfocal  Psych: Awake  Labs    Chemistry Recent Labs  Lab 06/23/18 2116 06/24/18 0928  06/27/18 0437 06/28/18 0400  06/28/18 1911 06/28/18 2019 06/29/18 0344 06/29/18 0352  NA 140 142   < > 141 139   < > 137 139 136 138  K 3.3* 3.5   < > 4.6 5.3*   < > 5.3* 4.7 4.7 4.8  CL 103 108   < > 106 108   < > 105  --  106 102  CO2 24 23   < > 28 27  --   --   --  23  --   GLUCOSE 137* 129*   < > 119* 160*   < > 132* 131* 160* 161*  BUN 18 19   < > 26* 25*   < > 21*  --  20 23*  CREATININE 1.01* 1.08*   < >  1.28* 1.15*   < > 0.90  --  1.12* 1.10*  CALCIUM 8.8* 8.5*   < > 8.6* 9.0  --   --   --  8.3*  --   PROT 7.1 6.9  --   --   --   --   --   --   --   --   ALBUMIN 2.6* 2.6*  --   --   --   --   --   --   --   --   AST 17 22  --   --   --   --   --   --   --   --   ALT 21 20  --   --   --   --   --   --   --   --   ALKPHOS 98 87  --   --   --   --   --   --   --   --   BILITOT 0.5 0.6  --   --   --   --   --   --   --   --   GFRNONAA >60 57*   < > 47* 53*  --   --   --  55*  --   GFRAA >60 >60   < > 54* >60  --   --   --  >60  --   ANIONGAP 13 11   < > 7 4*  --   --   --  7  --    < > = values in this interval not displayed.     Hematology Recent Labs  Lab 06/28/18 0400  06/28/18 1825  06/28/18 2004 06/28/18 2019 06/29/18 0344 06/29/18 0352  WBC 9.5  --   --   --  14.2*  --  13.4*  --   RBC 3.23*  --   --   --  3.52*  --  2.82*  --   HGB 8.5*   < > 7.3*   < > 9.1* 8.5* 7.3* 6.8*  HCT 28.4*   < > 23.7*   < > 30.2* 25.0* 23.9* 20.0*  MCV 87.9  --   --   --  85.8  --  84.8  --   MCH 26.3  --   --   --  25.9*  --  25.9*  --   MCHC 29.9*  --   --   --  30.1  --  30.5  --   RDW 13.6  --   --   --  14.6  --  14.6  --   PLT 454*  --  285  --  256  --  260  --    < > = values in this interval not displayed.    Cardiac Enzymes Recent Labs  Lab 06/24/18 0808 06/24/18 0928 06/24/18 1511  TROPONINI 0.64* 0.60* 0.49*    Recent Labs  Lab 06/23/18 2252  TROPIPOC 0.40*     BNP Recent Labs  Lab 06/23/18 2116  BNP 335.2*     DDimer No results for input(s): DDIMER in the last 168 hours.   Radiology    Dg Chest Port 1 View  Result Date: 06/29/2018 CLINICAL DATA:  CABG. EXAM: PORTABLE CHEST 1 VIEW COMPARISON:  Yesterday FINDINGS: Endotracheal tube tip at the clavicular heads. An orogastric tube and side-port reaches the stomach. Swan-Ganz catheter from the right with tip at the pulmonary artery outflow. Thoracic drains in place. Low lung volumes with atelectasis greater on the  left. No Kerley lines, definite effusion, or pneumothorax. Stable postoperative heart size. IMPRESSION: 1. Unremarkable hardware positioning. 2. Stable low volumes with atelectasis. Electronically Signed   By: Monte Fantasia M.D.   On: 06/29/2018 09:52   Dg Chest Port 1 View  Result Date: 06/28/2018 CLINICAL DATA:  Postop CABG EXAM: PORTABLE CHEST 1 VIEW COMPARISON:  06/23/2018 FINDINGS: Changes of CABG. Swan-Ganz catheter tip is in the central right pulmonary artery. Endotracheal tube is 2.5 cm above the carina. NG tube enters the stomach. Bilateral chest tubes in place. No pneumothorax. Cardiomegaly with vascular congestion and low lung volumes. No acute bony abnormality. IMPRESSION: Postop changes from CABG. Low lung volumes with no pneumothorax. Cardiomegaly, vascular congestion. Electronically Signed   By: Rolm Baptise M.D.   On: 06/28/2018 20:23    Cardiac Studies   Echo EF 55%  Patient Profile     52 y.o. female with severe coronary artery disease status post CABG  Assessment & Plan    CAD post CABG - Extubated this morning.  Progressing.  Uncomfortable. - Isosorbide for radial graft -Statin, beta-blocker, aspirin  Diabetes with coronary artery disease - Insulin drip transitioning, sliding scale  Non-ST elevation myocardial infarction - High intensity statin, consider clopidogrel closer to discharge.  Continue to mobilize.      For questions or updates, please contact [place] Please consult [email or web address] for contact info under        Signed, Candee Furbish, MD  06/29/2018, 11:51 AM

## 2018-06-29 NOTE — Progress Notes (Signed)
1 Day Post-Op Procedure(s) (LRB): CORONARY ARTERY BYPASS GRAFTING (CABG) times  four, using left internal mammary artery, endoscopically harvested right saphenous vein, and harvested left radial artery (N/A) TRANSESOPHAGEAL ECHOCARDIOGRAM (TEE) (N/A) RADIAL ARTERY HARVEST (Left) ENDOVEIN HARVEST OF GREATER SAPHENOUS VEIN (Right) Subjective: Intubated, responds to questions, nods to ?pain  Objective: Vital signs in last 24 hours: Temp:  [96.1 F (35.6 C)-99.3 F (37.4 C)] 98.4 F (36.9 C) (10/22 0700) Pulse Rate:  [63-89] 72 (10/22 0745) Cardiac Rhythm: Atrial paced (10/21 2300) Resp:  [10-23] 12 (10/22 0745) BP: (99-150)/(62-86) 132/62 (10/22 0745) SpO2:  [96 %-100 %] 99 % (10/22 0745) Arterial Line BP: (107-183)/(61-89) 154/73 (10/22 0700) FiO2 (%):  [40 %-50 %] 40 % (10/22 0745) Weight:  [76.4 kg-80.4 kg] 80.4 kg (10/22 0500)  Hemodynamic parameters for last 24 hours: PAP: (21-30)/(13-21) 24/15 CO:  [2.3 L/min-4.5 L/min] 3.9 L/min CI:  [1.3 L/min/m2-2.5 L/min/m2] 2.2 L/min/m2  Intake/Output from previous day: 10/21 0701 - 10/22 0700 In: 5310.8 [I.V.:3798.4; Blood:350; IV Piggyback:1162.4] Out: 3680 [Urine:2990; Blood:500; Chest Tube:190] Intake/Output this shift: No intake/output data recorded.  General appearance: alert, cooperative and no distress Neurologic: intact Heart: regular rate and rhythm Lungs: clear to auscultation bilaterally Abdomen: normal findings: soft, non-tender  Lab Results: Recent Labs    06/28/18 2004  06/29/18 0344 06/29/18 0352  WBC 14.2*  --  13.4*  --   HGB 9.1*   < > 7.3* 6.8*  HCT 30.2*   < > 23.9* 20.0*  PLT 256  --  260  --    < > = values in this interval not displayed.   BMET:  Recent Labs    06/28/18 0400  06/29/18 0344 06/29/18 0352  NA 139   < > 136 138  K 5.3*   < > 4.7 4.8  CL 108   < > 106 102  CO2 27  --  23  --   GLUCOSE 160*   < > 160* 161*  BUN 25*   < > 20 23*  CREATININE 1.15*   < > 1.12* 1.10*  CALCIUM  9.0  --  8.3*  --    < > = values in this interval not displayed.    PT/INR:  Recent Labs    06/28/18 2004  LABPROT 16.0*  INR 1.30   ABG    Component Value Date/Time   PHART 7.354 06/29/2018 0236   HCO3 22.1 06/29/2018 0236   TCO2 25 06/29/2018 0352   ACIDBASEDEF 3.0 (H) 06/29/2018 0236   O2SAT 97.0 06/29/2018 0236   CBG (last 3)  Recent Labs    06/29/18 0458 06/29/18 0557 06/29/18 0653  GLUCAP 142* 110* 102*    Assessment/Plan: S/P Procedure(s) (LRB): CORONARY ARTERY BYPASS GRAFTING (CABG) times  four, using left internal mammary artery, endoscopically harvested right saphenous vein, and harvested left radial artery (N/A) TRANSESOPHAGEAL ECHOCARDIOGRAM (TEE) (N/A) RADIAL ARTERY HARVEST (Left) ENDOVEIN HARVEST OF GREATER SAPHENOUS VEIN (Right) -CV- hemodynamics stable- dc swan and A line  Wean dopamine  ASA, beta blocker, statin, Imdur for radial graft RESP- extubate this AM RENAL- creatinine and lytes OK ENDO- CBG well controlled with insulin drip- transition to levemir + SSI Advance diet DC chest tubes SCD + enoxaparin for DVT prophylaxis Mobilize    LOS: 4 days    [name] 06/29/2018

## 2018-06-29 NOTE — Procedures (Signed)
Extubation Procedure Note  Patient Details:   Name: [name] DOB: 1965/11/20 MRN: [id number]   Airway Documentation:    Vent end date: 06/29/18 Vent end time: 0901   Evaluation  O2 sats: stable throughout Complications: No apparent complications Patient did tolerate procedure well. Bilateral Breath Sounds: Clear   Yes   Patient was extubated to a 4L [place] without any complications, dyspnea or stridor noted. Patient was instructed on IS x 5, highest goal reached was 529mL. VC: 668mL, NIF: greater than -40.  Cobb, Eddie [place] 06/29/2018, 9:01 AM

## 2018-06-29 NOTE — Progress Notes (Signed)
Patient passed her pulmonary mechanics and all other weaning criteria except for an audible cuff leak. RN will notify MD that patient doesn't have a cuff leak.

## 2018-06-29 NOTE — Progress Notes (Signed)
Pt is on Pressure Support Ventilation at this time, tolerating it well.

## 2018-06-29 NOTE — Progress Notes (Signed)
Patient ID: TEIA FREITAS, female   DOB: 1966/08/28, 52 y.o.   MRN: [id number] [facility] Evening Rounds:  Hemodynamically stable on low dose NTG IV for radial artery graft. Dopamine off. Urine output 15-30/hr. Creat up slightly. SBP 100. Will put back on low dose dopamine at 2 mcg for renal effect.   BMET    Component Value Date/Time   NA 137 06/29/2018 1540   K 4.5 06/29/2018 1540   CL 103 06/29/2018 1540   CO2 23 06/29/2018 0344   GLUCOSE 115 (H) 06/29/2018 1540   BUN 23 (H) 06/29/2018 1540   CREATININE 1.27 (H) 06/29/2018 1540   CREATININE 1.20 (H) 06/29/2018 1540   CREATININE 0.56 02/09/2014 0839   CALCIUM 8.3 (L) 06/29/2018 0344   GFRNONAA 47 (L) 06/29/2018 1540   GFRAA 54 (L) 06/29/2018 1540   CBC    Component Value Date/Time   WBC 16.9 (H) 06/29/2018 1540   RBC 2.84 (L) 06/29/2018 1540   HGB 7.5 (L) 06/29/2018 1540   HGB 8.8 (L) 06/29/2018 1540   HCT 24.6 (L) 06/29/2018 1540   HCT 26.0 (L) 06/29/2018 1540   PLT 276 06/29/2018 1540   MCV 86.6 06/29/2018 1540   MCH 26.4 06/29/2018 1540   MCHC 30.5 06/29/2018 1540   RDW 15.1 06/29/2018 1540   LYMPHSABS 0.9 06/23/2018 2116   MONOABS 0.8 06/23/2018 2116   EOSABS 0.2 06/23/2018 2116   BASOSABS 0.1 06/23/2018 2116

## 2018-06-30 ENCOUNTER — Inpatient Hospital Stay (HOSPITAL_COMMUNITY): Payer: BLUE CROSS/BLUE SHIELD

## 2018-06-30 LAB — CBC
HCT: 24.4 % — ABNORMAL LOW (ref 36.0–46.0)
Hemoglobin: 7.3 g/dL — ABNORMAL LOW (ref 12.0–15.0)
MCH: 26.2 pg (ref 26.0–34.0)
MCHC: 29.9 g/dL — ABNORMAL LOW (ref 30.0–36.0)
MCV: 87.5 fL (ref 80.0–100.0)
Platelets: 263 10*3/uL (ref 150–400)
RBC: 2.79 MIL/uL — ABNORMAL LOW (ref 3.87–5.11)
RDW: 15.2 % (ref 11.5–15.5)
WBC: 17.1 10*3/uL — ABNORMAL HIGH (ref 4.0–10.5)
nRBC: 0 % (ref 0.0–0.2)

## 2018-06-30 LAB — TYPE AND SCREEN
ABO/RH(D): O POS
Antibody Screen: NEGATIVE
Unit division: 0
Unit division: 0
Unit division: 0
Unit division: 0

## 2018-06-30 LAB — BPAM RBC
Blood Product Expiration Date: 201911222359
Blood Product Expiration Date: 201911222359
Blood Product Expiration Date: 201911222359
Blood Product Expiration Date: 201911222359
ISSUE DATE / TIME: 201910211352
ISSUE DATE / TIME: 201910211352
Unit Type and Rh: 5100
Unit Type and Rh: 5100
Unit Type and Rh: 5100
Unit Type and Rh: 5100

## 2018-06-30 LAB — GLUCOSE, CAPILLARY
Glucose-Capillary: 103 mg/dL — ABNORMAL HIGH (ref 70–99)
Glucose-Capillary: 130 mg/dL — ABNORMAL HIGH (ref 70–99)
Glucose-Capillary: 75 mg/dL (ref 70–99)
Glucose-Capillary: 80 mg/dL (ref 70–99)
Glucose-Capillary: 91 mg/dL (ref 70–99)
Glucose-Capillary: 98 mg/dL (ref 70–99)

## 2018-06-30 LAB — BASIC METABOLIC PANEL
Anion gap: 7 (ref 5–15)
BUN: 27 mg/dL — ABNORMAL HIGH (ref 6–20)
CO2: 25 mmol/L (ref 22–32)
Calcium: 8.1 mg/dL — ABNORMAL LOW (ref 8.9–10.3)
Chloride: 106 mmol/L (ref 98–111)
Creatinine, Ser: 1.37 mg/dL — ABNORMAL HIGH (ref 0.44–1.00)
GFR calc Af Amer: 50 mL/min — ABNORMAL LOW (ref >60)
GFR calc non Af Amer: 43 mL/min — ABNORMAL LOW (ref >60)
Glucose, Bld: 110 mg/dL — ABNORMAL HIGH (ref 70–99)
Potassium: 4.2 mmol/L (ref 3.5–5.1)
Sodium: 138 mmol/L (ref 135–145)

## 2018-06-30 MED ORDER — ISOSORBIDE MONONITRATE ER 30 MG PO TB24
15.0000 mg | ORAL_TABLET | Freq: Every day | ORAL | Status: DC
  Administered 2018-06-30 – 2018-07-06 (×7): 15 mg via ORAL
  Filled 2018-06-30 (×7): qty 1

## 2018-06-30 MED ORDER — INSULIN ASPART 100 UNIT/ML ~~LOC~~ SOLN
3.0000 [IU] | Freq: Three times a day (TID) | SUBCUTANEOUS | Status: DC
  Administered 2018-07-01 – 2018-07-06 (×3): 3 [IU] via SUBCUTANEOUS

## 2018-06-30 MED ORDER — FUROSEMIDE 10 MG/ML IJ SOLN
20.0000 mg | Freq: Once | INTRAMUSCULAR | Status: AC
  Administered 2018-06-30: 20 mg via INTRAVENOUS
  Filled 2018-06-30: qty 2

## 2018-06-30 MED ORDER — INSULIN ASPART 100 UNIT/ML ~~LOC~~ SOLN
0.0000 [IU] | Freq: Every day | SUBCUTANEOUS | Status: DC
  Administered 2018-07-02: 2 [IU] via SUBCUTANEOUS

## 2018-06-30 MED ORDER — INSULIN ASPART 100 UNIT/ML ~~LOC~~ SOLN
0.0000 [IU] | Freq: Three times a day (TID) | SUBCUTANEOUS | Status: DC
  Administered 2018-07-01: 2 [IU] via SUBCUTANEOUS
  Administered 2018-07-02: 3 [IU] via SUBCUTANEOUS
  Administered 2018-07-03: 5 [IU] via SUBCUTANEOUS
  Administered 2018-07-03: 3 [IU] via SUBCUTANEOUS
  Administered 2018-07-03: 5 [IU] via SUBCUTANEOUS
  Administered 2018-07-04: 3 [IU] via SUBCUTANEOUS
  Administered 2018-07-04: 5 [IU] via SUBCUTANEOUS
  Administered 2018-07-04: 2 [IU] via SUBCUTANEOUS
  Administered 2018-07-05 (×3): 3 [IU] via SUBCUTANEOUS
  Administered 2018-07-06: 5 [IU] via SUBCUTANEOUS
  Administered 2018-07-06: 3 [IU] via SUBCUTANEOUS

## 2018-06-30 MED FILL — Heparin Sodium (Porcine) Inj 1000 Unit/ML: INTRAMUSCULAR | Qty: 20 | Status: AC

## 2018-06-30 MED FILL — Sodium Chloride IV Soln 0.9%: INTRAVENOUS | Qty: 2000 | Status: AC

## 2018-06-30 MED FILL — Electrolyte-R (PH 7.4) Solution: INTRAVENOUS | Qty: 3000 | Status: AC

## 2018-06-30 MED FILL — Sodium Bicarbonate IV Soln 8.4%: INTRAVENOUS | Qty: 50 | Status: AC

## 2018-06-30 MED FILL — Lidocaine HCl(Cardiac) IV PF Soln Pref Syr 100 MG/5ML (2%): INTRAVENOUS | Qty: 5 | Status: AC

## 2018-06-30 MED FILL — Mannitol IV Soln 20%: INTRAVENOUS | Qty: 500 | Status: AC

## 2018-06-30 NOTE — Progress Notes (Signed)
Anesthesiology Follow-up:  Awake and alert, neuro intact.  VS: T- 36.7 BP- 111/68 HR- 71 (SR) RR- 10 O2 Sat- 97% on 3L [place]  K- 4.5 Na- 137 BUN/Cr- 23/1.27 glucose- 115 H/H- 8.8/26.0 Platelets- 46,76  52 year old female S/P NonSTEMI with severe 3V CAD and moderate LV dysfunction, now one day S/P CABG X 4. Extubated at 9:00 AM today on POD #1. Hemodynamically stable, dopamine restarted for low urine output.  Roberts Gaudy

## 2018-06-30 NOTE — Progress Notes (Signed)
2 Days Post-Op Procedure(s) (LRB): CORONARY ARTERY BYPASS GRAFTING (CABG) times  four, using left internal mammary artery, endoscopically harvested right saphenous vein, and harvested left radial artery (N/A) TRANSESOPHAGEAL ECHOCARDIOGRAM (TEE) (N/A) RADIAL ARTERY HARVEST (Left) ENDOVEIN HARVEST OF GREATER SAPHENOUS VEIN (Right) Subjective: C/o incisional pain  Objective: Vital signs in last 24 hours: Temp:  [98.1 F (36.7 C)-98.5 F (36.9 C)] 98.5 F (36.9 C) (10/23 0700) Pulse Rate:  [66-82] 76 (10/23 0615) Cardiac Rhythm: Normal sinus rhythm;Atrial paced (10/23 0400) Resp:  [6-21] 20 (10/23 0615) BP: (95-163)/(61-80) 128/74 (10/23 0615) SpO2:  [89 %-100 %] 99 % (10/23 0615) Arterial Line BP: (73-162)/(61-81) 148/62 (10/22 1700) FiO2 (%):  [40 %] 40 % (10/22 0830) Weight:  [78.9 kg] 78.9 kg (10/23 0500)  Hemodynamic parameters for last 24 hours: PAP: (29-32)/(18) 32/18  Intake/Output from previous day: 10/22 0701 - 10/23 0700 In: 987.6 [I.V.:885.8; IV Piggyback:101.8] Out: 730 [Urine:690; Chest Tube:40] Intake/Output this shift: No intake/output data recorded.  General appearance: alert, cooperative and no distress Neurologic: intact Heart: regular rate and rhythm Lungs: diminished breath sounds bibasilar Abdomen: normal findings: soft, non-tender  Lab Results: Recent Labs    06/29/18 1540 06/30/18 0345  WBC 16.9* 17.1*  HGB 8.8*  7.5* 7.3*  HCT 26.0*  24.6* 24.4*  PLT 276 263   BMET:  Recent Labs    06/29/18 0344  06/29/18 1540 06/30/18 0345  NA 136   < > 137 138  K 4.7   < > 4.5 4.2  CL 106   < > 103 106  CO2 23  --   --  25  GLUCOSE 160*   < > 115* 110*  BUN 20   < > 23* 27*  CREATININE 1.12*   < > 1.20*  1.27* 1.37*  CALCIUM 8.3*  --   --  8.1*   < > = values in this interval not displayed.    PT/INR:  Recent Labs    06/28/18 2004  LABPROT 16.0*  INR 1.30   ABG    Component Value Date/Time   PHART 7.422 06/29/2018 1003   HCO3 26.3  06/29/2018 1003   TCO2 24 06/29/2018 1540   ACIDBASEDEF 3.0 (H) 06/29/2018 0236   O2SAT 99.0 06/29/2018 1003   CBG (last 3)  Recent Labs    06/30/18 0006 06/30/18 0407 06/30/18 0757  GLUCAP 130* 80 103*    Assessment/Plan: S/P Procedure(s) (LRB): CORONARY ARTERY BYPASS GRAFTING (CABG) times  four, using left internal mammary artery, endoscopically harvested right saphenous vein, and harvested left radial artery (N/A) TRANSESOPHAGEAL ECHOCARDIOGRAM (TEE) (N/A) RADIAL ARTERY HARVEST (Left) ENDOVEIN HARVEST OF GREATER SAPHENOUS VEIN (Right) -POD # 2 CV- dopamine restarted last night for low BP- will try to wean slowly today  ASA, beta blocker, statin, Imdur for radial graft RESP- Atelectasis on CXR, poor effort with IS  Continue IS, add flutter valve RENAL- creatinine up slightly to 1.37, keep Foley today to monitor UO  Low dose lasix ENDO- CBG well controlled  Continue levemir, change to AC and HS CBG/ SSI Anemia secondary to ABL- Hct 24- follow Deconditioning- slow to move, will ask PT to see   LOS: 5 days    [name] 06/30/2018

## 2018-06-30 NOTE — Progress Notes (Addendum)
Inpatient Diabetes Program Recommendations  AACE/ADA: New Consensus Statement on Inpatient Glycemic Control (2019)  Target Ranges:  Prepandial:   less than 140 mg/dL      Peak postprandial:   less than 180 mg/dL (1-2 hours)      Critically ill patients:  140 - 180 mg/dL  Results for EDILIA, GHUMAN (MRN [id number]) as of 06/30/2018 09:55  Ref. Range 06/29/2018 07:56 06/29/2018 08:52 06/29/2018 10:01 06/29/2018 11:28 06/29/2018 12:34 06/29/2018 14:13 06/29/2018 15:37 06/29/2018 20:07 06/30/2018 00:06 06/30/2018 04:07 06/30/2018 07:57  Glucose-Capillary Latest Ref Range: 70 - 99 mg/dL 106 (H) 97 102 (H) 107 (H)  Levemir 30 units 110 (H) 86 106 (H) 134 (H)  Novolog 2 units 130 (H)  Novolog 2 units 80 103 (H)    Review of Glycemic Control  Diabetes history: DM2 Outpatient Diabetes medications: Lantus 100 units QAM, Lantus 50 units QHS, Metformin 1000 mg BID Current orders for Inpatient glycemic control: Levemir 30 units BID, Novolog 3 units with meals, Novolog 0-15 units TID with meals, Novolog 0-5 units QHS  Inpatient Diabetes Program Recommendations:  Insulin - Basal: Noted evening Levemir held last night. Fasting glucose 103 mg/dl today. May want to consider decreasing Levemir to 30 units daily. HgbA1C: A1C 11.1% on 06/26/18 indicating an average glucose of 272 mg/dl over the past 2-3 months.  Addendum 06/30/18@13 :00-Spoke with patient about diabetes and home regimen for diabetes control. Patient reports that she is followed by PCP for diabetes management and currently she confirms she is taking Lantus 100 units QAM, Lantus 50 units QHS and Metformin 1000 mg BID as an outpatient for diabetes control. Patient reports that she is taking DM medication as prescribed. Patient states that she checks her glucose 1 time per day in the morning and it is usually in the 160's mg/dl.  Inquired about prior A1C and patient reports that she does not recall her last A1C value. Discussed A1C results  (11.1% on 06/26/18) and explained that her current A1C indicates an average glucose of 272 mg/dl over the past 2-3 months. Discussed glucose and A1C goals. Discussed importance of checking CBGs and maintaining good CBG control to prevent long-term and short-term complications especially following CABG surgery. Encouraged patient to check glucose 3-4 times per day to allow more data on glucose trends to help her PCP make adjustments with DM medications if needed. Patient verbalized understanding of information discussed and she states that she has no further questions at this time related to diabetes.  Thanks, Barnie Alderman, RN, MSN, CDE Diabetes Coordinator Inpatient Diabetes Program [phone] (Team Pager from 8am to 5pm)

## 2018-06-30 NOTE — Progress Notes (Signed)
CT surgery p.m. Rounds  Patient comfortable resting in bed Dopamine weaned off, urine output excellent Patient did not mobilize today, waiting for physical therapy Continue current care

## 2018-06-30 NOTE — Progress Notes (Signed)
Chart reviewed for LOS; B Chandler RN,MHA,BSN [phone] 

## 2018-06-30 NOTE — Progress Notes (Signed)
  Notes reviewed. Physical therapy has been asked to see.  Incisional pain has been challenging for her. Blood pressure soft overnight, dopamine added back, slowly titrating down.  Very low dose currently.  Candee Furbish, MD

## 2018-06-30 NOTE — Anesthesia Postprocedure Evaluation (Signed)
Anesthesia Post Note  Patient: [name]  Procedure(s) Performed: CORONARY ARTERY BYPASS GRAFTING (CABG) times  four, using left internal mammary artery, endoscopically harvested right saphenous vein, and harvested left radial artery (N/A Chest) TRANSESOPHAGEAL ECHOCARDIOGRAM (TEE) (N/A ) RADIAL ARTERY HARVEST (Left Arm Lower) ENDOVEIN HARVEST OF GREATER SAPHENOUS VEIN (Right Leg Upper)     Patient location during evaluation: SICU Anesthesia Type: General Level of consciousness: sedated Pain management: pain level controlled Vital Signs Assessment: post-procedure vital signs reviewed and stable Respiratory status: patient remains intubated per anesthesia plan Cardiovascular status: stable Postop Assessment: no apparent nausea or vomiting Anesthetic complications: no    Last Vitals:  Vitals:   06/30/18 1500 06/30/18 1600  BP: 124/68 131/75  Pulse: 74 80  Resp: 11 13  Temp:    SpO2: 100% 100%    Last Pain:  Vitals:   06/30/18 1526  TempSrc:   PainSc: 6     LLE Motor Response: Purposeful movement (06/30/18 1600)   RLE Motor Response: Purposeful movement (06/30/18 1600)        Tiajuana Amass

## 2018-06-30 NOTE — Progress Notes (Signed)
CBG 75. Contacted Dr. Prescott Gum for orders to hold Levemir. Orders received from physician to D/C Levemir.

## 2018-06-30 NOTE — Plan of Care (Signed)
  Problem: Clinical Measurements: Goal: Ability to maintain clinical measurements within normal limits will improve Outcome: Progressing Goal: Will remain free from infection Outcome: Progressing Goal: Diagnostic test results will improve Outcome: Progressing Goal: Respiratory complications will improve Outcome: Progressing Goal: Cardiovascular complication will be avoided Outcome: Progressing   Problem: Activity: Goal: Risk for activity intolerance will decrease Outcome: Progressing   Problem: Coping: Goal: Level of anxiety will decrease Outcome: Progressing   Problem: Safety: Goal: Ability to remain free from injury will improve Outcome: Progressing   Problem: Skin Integrity: Goal: Risk for impaired skin integrity will decrease Outcome: Progressing   

## 2018-07-01 ENCOUNTER — Inpatient Hospital Stay (HOSPITAL_COMMUNITY): Payer: BLUE CROSS/BLUE SHIELD

## 2018-07-01 ENCOUNTER — Encounter (HOSPITAL_COMMUNITY): Payer: Self-pay | Admitting: Cardiovascular Disease

## 2018-07-01 LAB — GLUCOSE, CAPILLARY
Glucose-Capillary: 71 mg/dL (ref 70–99)
Glucose-Capillary: 72 mg/dL (ref 70–99)
Glucose-Capillary: 143 mg/dL — ABNORMAL HIGH (ref 70–99)
Glucose-Capillary: 125 mg/dL — ABNORMAL HIGH (ref 70–99)
Glucose-Capillary: 85 mg/dL (ref 70–99)

## 2018-07-01 LAB — CBC
HCT: 23.1 % — ABNORMAL LOW (ref 36.0–46.0)
Hemoglobin: 6.9 g/dL — CL (ref 12.0–15.0)
MCH: 26.5 pg (ref 26.0–34.0)
MCHC: 29.9 g/dL — ABNORMAL LOW (ref 30.0–36.0)
MCV: 88.8 fL (ref 80.0–100.0)
Platelets: 267 10*3/uL (ref 150–400)
RBC: 2.6 MIL/uL — ABNORMAL LOW (ref 3.87–5.11)
RDW: 15.3 % (ref 11.5–15.5)
WBC: 13.2 10*3/uL — ABNORMAL HIGH (ref 4.0–10.5)
nRBC: 0 % (ref 0.0–0.2)

## 2018-07-01 LAB — BASIC METABOLIC PANEL
Anion gap: 6 (ref 5–15)
BUN: 32 mg/dL — ABNORMAL HIGH (ref 6–20)
CO2: 26 mmol/L (ref 22–32)
Calcium: 7.8 mg/dL — ABNORMAL LOW (ref 8.9–10.3)
Chloride: 106 mmol/L (ref 98–111)
Creatinine, Ser: 1.38 mg/dL — ABNORMAL HIGH (ref 0.44–1.00)
GFR calc Af Amer: 49 mL/min — ABNORMAL LOW (ref >60)
GFR calc non Af Amer: 42 mL/min — ABNORMAL LOW (ref >60)
Glucose, Bld: 75 mg/dL (ref 70–99)
Potassium: 4.3 mmol/L (ref 3.5–5.1)
Sodium: 138 mmol/L (ref 135–145)

## 2018-07-01 LAB — HEMOGLOBIN AND HEMATOCRIT, BLOOD
HCT: 29.2 % — ABNORMAL LOW (ref 36.0–46.0)
Hemoglobin: 8.6 g/dL — ABNORMAL LOW (ref 12.0–15.0)

## 2018-07-01 MED ORDER — FUROSEMIDE 10 MG/ML IJ SOLN
40.0000 mg | Freq: Once | INTRAMUSCULAR | Status: AC
  Administered 2018-07-01: 40 mg via INTRAVENOUS
  Filled 2018-07-01: qty 4

## 2018-07-01 MED ORDER — SODIUM CHLORIDE 0.9% IV SOLUTION
Freq: Once | INTRAVENOUS | Status: AC
  Administered 2018-07-01: 08:00:00 via INTRAVENOUS

## 2018-07-01 NOTE — Progress Notes (Signed)
[name], female   DOB: [birth year], 52 y.o.   MRN: [id number] TCTS Evening Rounds:  Hemodynamically stable in sinus rhythm.  sats 100%  Diuresed today  Transfused this am for Hgb of 6.9 and repeat 8.6  Will consult PT to help with mobilization.

## 2018-07-01 NOTE — Progress Notes (Signed)
   Notes reviewed.  Progressing slowly but moving in the right direction.  Blood pressure soft.  Getting blood transfusion.  Atelectasis improving.  Candee Furbish, MD

## 2018-07-01 NOTE — Progress Notes (Signed)
CRITICAL VALUE ALERT  Critical Value:  Hgb 6.9  Date & Time Notied:  07/01/18 0347  Provider Notified: Paged Dr. Prescott Gum at [phone]. Returned call at [phone].   Orders Received/Actions taken: Orders received.

## 2018-07-01 NOTE — Care Management Note (Signed)
Case Management Note  Patient Details  Name: [name] MRN: [id number] Date of Birth: 11-25-1965  Subjective/Objective:     3 Days Post-Op Procedure(s) CORONARY ARTERY BYPASS GRAFTING               Action/Plan: PCP: Mosie Lukes, MD, has private insurance with BCBS with prescription drug coverage; CM following for progression of care.  Expected Discharge Date:    possibly 07/09/2018              Expected Discharge Plan:  [place] possibly, depends on her progression  Discharge planning Services  CM Consult  Status of Service:  In process, will continue to follow  Sherrilyn Rist [phone] 07/01/2018, 12:02 PM

## 2018-07-01 NOTE — Progress Notes (Signed)
Patient became dizzy and pale upon standing. Nurse was not able to ambulate patient.

## 2018-07-01 NOTE — Progress Notes (Signed)
3 Days Post-Op Procedure(s) (LRB): CORONARY ARTERY BYPASS GRAFTING (CABG) times  four, using left internal mammary artery, endoscopically harvested right saphenous vein, and harvested left radial artery (N/A) TRANSESOPHAGEAL ECHOCARDIOGRAM (TEE) (N/A) RADIAL ARTERY HARVEST (Left) ENDOVEIN HARVEST OF GREATER SAPHENOUS VEIN (Right) Subjective: Light headed when she tried to gt up this AM Currently receiving blood transfusion Denies pain and nausea  Objective: Vital signs in last 24 hours: Temp:  [97 F (36.1 C)-98.7 F (37.1 C)] 98.5 F (36.9 C) (10/24 0828) Pulse Rate:  [67-80] 72 (10/24 0828) Cardiac Rhythm: Normal sinus rhythm (10/24 0800) Resp:  [0-18] 9 (10/24 0828) BP: (104-149)/(62-81) 116/81 (10/24 0802) SpO2:  [93 %-100 %] 98 % (10/24 0828) Weight:  [78.4 kg] 78.4 kg (10/24 0500)  Hemodynamic parameters for last 24 hours:    Intake/Output from previous day: 10/23 0701 - 10/24 0700 In: 480.7 [P.O.:225; I.V.:255.7] Out: 1740 [Urine:1740] Intake/Output this shift: Total I/O In: 150 [P.O.:120; Blood:30] Out: 50 [Urine:50]  General appearance: more alert, cooperative Neurologic: intact Heart: regular rate and rhythm Lungs: diminished breath sounds bibasilar Abdomen: normal findings: soft, non-tender  Lab Results: Recent Labs    06/30/18 0345 07/01/18 0308  WBC 17.1* 13.2*  HGB 7.3* 6.9*  HCT 24.4* 23.1*  PLT 263 267   BMET:  Recent Labs    06/30/18 0345 07/01/18 0308  NA 138 138  K 4.2 4.3  CL 106 106  CO2 25 26  GLUCOSE 110* 75  BUN 27* 32*  CREATININE 1.37* 1.38*  CALCIUM 8.1* 7.8*    PT/INR:  Recent Labs    06/28/18 2004  LABPROT 16.0*  INR 1.30   ABG    Component Value Date/Time   PHART 7.422 06/29/2018 1003   HCO3 26.3 06/29/2018 1003   TCO2 24 06/29/2018 1540   ACIDBASEDEF 3.0 (H) 06/29/2018 0236   O2SAT 99.0 06/29/2018 1003   CBG (last 3)  Recent Labs    06/30/18 1615 06/30/18 2116 07/01/18 0746  GLUCAP 98 75 71     Assessment/Plan: S/P Procedure(s) (LRB): CORONARY ARTERY BYPASS GRAFTING (CABG) times  four, using left internal mammary artery, endoscopically harvested right saphenous vein, and harvested left radial artery (N/A) TRANSESOPHAGEAL ECHOCARDIOGRAM (TEE) (N/A) RADIAL ARTERY HARVEST (Left) ENDOVEIN HARVEST OF GREATER SAPHENOUS VEIN (Right) -  CV- in SR, Bp on low side, should improve with transfusion  RESP- Atelectasis looks better on CXR this AM- continue IS, flutter  RENAL- creatinine stable  ENDO- CBG relatively low- levemir discontinued, continue SSI  Restart metformin when creatinine down  Anemia- acute secondary to ABL and hemodilution on chronic- transfuse for symptomatic anemia this AM  Continue cardiac rehab   LOS: 6 days    [name] 07/01/2018

## 2018-07-02 LAB — BASIC METABOLIC PANEL
Anion gap: 8 (ref 5–15)
BUN: 36 mg/dL — ABNORMAL HIGH (ref 6–20)
CO2: 27 mmol/L (ref 22–32)
Calcium: 7.7 mg/dL — ABNORMAL LOW (ref 8.9–10.3)
Chloride: 104 mmol/L (ref 98–111)
Creatinine, Ser: 1.39 mg/dL — ABNORMAL HIGH (ref 0.44–1.00)
GFR calc Af Amer: 49 mL/min — ABNORMAL LOW (ref >60)
GFR calc non Af Amer: 42 mL/min — ABNORMAL LOW (ref >60)
Glucose, Bld: 93 mg/dL (ref 70–99)
Potassium: 4 mmol/L (ref 3.5–5.1)
Sodium: 139 mmol/L (ref 135–145)

## 2018-07-02 LAB — CBC
HCT: 26.5 % — ABNORMAL LOW (ref 36.0–46.0)
Hemoglobin: 7.9 g/dL — ABNORMAL LOW (ref 12.0–15.0)
MCH: 26.3 pg (ref 26.0–34.0)
MCHC: 29.8 g/dL — ABNORMAL LOW (ref 30.0–36.0)
MCV: 88.3 fL (ref 80.0–100.0)
Platelets: 309 10*3/uL (ref 150–400)
RBC: 3 MIL/uL — ABNORMAL LOW (ref 3.87–5.11)
RDW: 15.5 % (ref 11.5–15.5)
WBC: 11.1 10*3/uL — ABNORMAL HIGH (ref 4.0–10.5)
nRBC: 0 % (ref 0.0–0.2)

## 2018-07-02 LAB — TYPE AND SCREEN
ABO/RH(D): O POS
Antibody Screen: NEGATIVE
Unit division: 0

## 2018-07-02 LAB — GLUCOSE, CAPILLARY
Glucose-Capillary: 76 mg/dL (ref 70–99)
Glucose-Capillary: 83 mg/dL (ref 70–99)
Glucose-Capillary: 99 mg/dL (ref 70–99)
Glucose-Capillary: 156 mg/dL — ABNORMAL HIGH (ref 70–99)
Glucose-Capillary: 202 mg/dL — ABNORMAL HIGH (ref 70–99)

## 2018-07-02 LAB — BPAM RBC
Blood Product Expiration Date: 201911182359
ISSUE DATE / TIME: 201910240757
Unit Type and Rh: 5100

## 2018-07-02 LAB — TSH: TSH: 3.396 u[IU]/mL (ref 0.350–4.500)

## 2018-07-02 MED ORDER — SODIUM CHLORIDE 0.9% FLUSH: 3.0000 mL | INTRAVENOUS | Status: DC | PRN

## 2018-07-02 MED ORDER — SODIUM CHLORIDE 0.9 % IV SOLN: 250.0000 mL | INTRAVENOUS | Status: DC | PRN

## 2018-07-02 MED ORDER — MELOXICAM 7.5 MG PO TABS
7.5000 mg | ORAL_TABLET | Freq: Every day | ORAL | Status: DC
  Administered 2018-07-02 – 2018-07-03 (×2): 7.5 mg via ORAL
  Filled 2018-07-02 (×2): qty 1

## 2018-07-02 MED ORDER — MAGNESIUM HYDROXIDE 400 MG/5ML PO SUSP: 30.0000 mL | Freq: Every day | ORAL | Status: DC | PRN

## 2018-07-02 MED ORDER — MOVING RIGHT ALONG BOOK
Freq: Once | Status: AC
  Administered 2018-07-02: 1
  Filled 2018-07-02: qty 1

## 2018-07-02 MED ORDER — SODIUM CHLORIDE 0.9% FLUSH
3.0000 mL | Freq: Two times a day (BID) | INTRAVENOUS | Status: DC
  Administered 2018-07-02 – 2018-07-06 (×8): 3 mL via INTRAVENOUS

## 2018-07-02 MED ORDER — ALUM & MAG HYDROXIDE-SIMETH 200-200-20 MG/5ML PO SUSP: 15.0000 mL | Freq: Four times a day (QID) | ORAL | Status: DC | PRN

## 2018-07-02 MED ORDER — FUROSEMIDE 40 MG PO TABS
40.0000 mg | ORAL_TABLET | Freq: Every day | ORAL | Status: DC
  Administered 2018-07-02 – 2018-07-06 (×5): 40 mg via ORAL
  Filled 2018-07-02 (×5): qty 1

## 2018-07-02 MED ORDER — OXYCODONE HCL 5 MG PO TABS
5.0000 mg | ORAL_TABLET | Freq: Four times a day (QID) | ORAL | Status: DC | PRN
  Administered 2018-07-04 (×2): 5 mg via ORAL
  Filled 2018-07-02 (×3): qty 1

## 2018-07-02 MED ORDER — POTASSIUM CHLORIDE CRYS ER 20 MEQ PO TBCR
20.0000 meq | EXTENDED_RELEASE_TABLET | Freq: Every day | ORAL | Status: DC
  Administered 2018-07-02 – 2018-07-06 (×5): 20 meq via ORAL
  Filled 2018-07-02 (×5): qty 1

## 2018-07-02 NOTE — Progress Notes (Signed)
Pt arrived to 4e from 2h. Pt oriented to room and staff. CHG bath completed. Vitals obtained. Pt denies needs at this time. Will continue current plan of care.   Ara Kussmaul BSN, RN

## 2018-07-02 NOTE — Evaluation (Addendum)
Physical Therapy Evaluation Patient Details Name: [name] MRN: [id number] DOB: 1966/03/11 Today's Date: 07/02/2018   History of Present Illness  Pt is a 52 y.o. F with significant PMH of diabetes mellitus type 2, stroke, diastolic CHF, peripheral vascular disease, depression who presents to ER with chest pain and SOB and ruled in for NSTEMI. Now s/p coronary artery bypass grafting x 4.  Clinical Impression  Pt admitted with above diagnosis. Pt currently with functional limitations due to the deficits listed below (see PT Problem List). Prior to admission, patient was independent with mobility and ADL's and works with machinery. Of note, patient's husband passed away last month. Currently, patient presenting with decreased functional mobility secondary to balance deficits, diminished endurance, and decreased knowledge of sternal precautions. Requiring min assist for most aspects of functional mobility. Ambulating 150 feet with walker, displaying unsteadiness and fatiguing easily. HR up to 92 bpm during mobility. Educated on sternal precautions, but will need reinforcement. Recommending CIR currently to maximize functional independence, however, will continue to reassess as patient may progress quickly. Pt will benefit from skilled PT to increase their independence and safety with mobility to allow discharge to the venue listed below.       Follow Up Recommendations CIR    Equipment Recommendations  Rolling walker with 5" wheels;3in1 (PT)    Recommendations for Other Services Rehab consult;OT consult     Precautions / Restrictions Precautions Precautions: Fall;Sternal Precaution Booklet Issued: Yes (comment) Precaution Comments: Verbally reviewed and provided handout Restrictions Weight Bearing Restrictions: No Other Position/Activity Restrictions: sternal precautions      Mobility  Bed Mobility Overal bed mobility: Needs Assistance Bed Mobility: Rolling;Sidelying to  Sit Rolling: Min guard Sidelying to sit: Min guard       General bed mobility comments: Min guard for safety. Cues for log roll technique  Transfers Overall transfer level: Needs assistance Equipment used: Rolling walker (2 wheeled) Transfers: Sit to/from Stand Sit to Stand: Min assist         General transfer comment: Light min assist to power up from elevated surface. Cues for placing hands on knees and use of momentum  Ambulation/Gait Ambulation/Gait assistance: Min assist Gait Distance (Feet): 150 Feet Assistive device: Rolling walker (2 wheeled) Gait Pattern/deviations: Step-through pattern;Drifts right/left;Trunk flexed;Decreased stride length Gait velocity: decreased Gait velocity interpretation: <1.8 ft/sec, indicate of risk for recurrent falls General Gait Details: Patient requiring min assist for balance due to unsteadiness and drifting right/left. Heavy reliance through BUE's on walker. Fatigues easily. Cues for walker proximity  Stairs            Wheelchair Mobility    Modified Rankin (Stroke Patients Only)       Balance Overall balance assessment: Needs assistance Sitting-balance support: No upper extremity supported;Feet supported Sitting balance-Leahy Scale: Good     Standing balance support: Bilateral upper extremity supported Standing balance-Leahy Scale: Fair                               Pertinent Vitals/Pain Pain Assessment: Faces Faces Pain Scale: Hurts a little bit Pain Location: incisional Pain Descriptors / Indicators: Grimacing Pain Intervention(s): Monitored during session    Home Living Family/patient expects to be discharged to:: Private residence Living Arrangements: Children(son) Available Help at Discharge: Family;Available PRN/intermittently Type of Home: House Home Access: Ramped entrance     Home Layout: One level Home Equipment: None      Prior Function Level of Independence:  Independent          Comments: Works with Pension scheme manager        Extremity/Trunk Assessment   Upper Extremity Assessment Upper Extremity Assessment: Overall WFL for tasks assessed    Lower Extremity Assessment Lower Extremity Assessment: Overall WFL for tasks assessed    Cervical / Trunk Assessment Cervical / Trunk Assessment: Normal  Communication   Communication: No difficulties  Cognition Arousal/Alertness: Awake/alert Behavior During Therapy: WFL for tasks assessed/performed Overall Cognitive Status: Within Functional Limits for tasks assessed                                        General Comments      Exercises     Assessment/Plan    PT Assessment Patient needs continued PT services  PT Problem List Decreased strength;Decreased activity tolerance;Decreased balance;Decreased mobility;Decreased knowledge of precautions       PT Treatment Interventions DME instruction;Gait training;Functional mobility training;Therapeutic activities;Therapeutic exercise;Balance training;Patient/family education    PT Goals (Current goals can be found in the Care Plan section)  Acute Rehab PT Goals Patient Stated Goal: "go back to work in 6 months." PT Goal Formulation: With patient Time For Goal Achievement: 07/16/18 Potential to Achieve Goals: Good    Frequency Min 3X/week   Barriers to discharge        Co-evaluation               AM-PAC PT "6 Clicks" Daily Activity  Outcome Measure Difficulty turning over in bed (including adjusting bedclothes, sheets and blankets)?: A Little Difficulty moving from lying on back to sitting on the side of the bed? : Unable Difficulty sitting down on and standing up from a chair with arms (e.g., wheelchair, bedside commode, etc,.)?: Unable Help needed moving to and from a bed to chair (including a wheelchair)?: A Little Help needed walking in hospital room?: A Little Help needed climbing 3-5 steps with a railing? :  A Lot 6 Click Score: 13    End of Session Equipment Utilized During Treatment: Gait belt Activity Tolerance: Patient tolerated treatment well Patient left: in chair;with call bell/phone within reach Nurse Communication: Mobility status PT Visit Diagnosis: Unsteadiness on feet (R26.81);Difficulty in walking, not elsewhere classified (R26.2)    Time: 7793-9030 PT Time Calculation (min) (ACUTE ONLY): 22 min   Charges:   PT Evaluation $PT Eval Moderate Complexity: 1 Mod         Ellamae Sia, [place], DPT Acute Rehabilitation Services Pager [phone] Office [phone]   Willy Eddy 07/02/2018, 2:58 PM

## 2018-07-02 NOTE — Progress Notes (Signed)
Rehab Admissions Coordinator Note:  Patient was screened by Cleatrice Burke for appropriateness for an Inpatient Acute Rehab Consult per PT recommendation. It pt fails to progress to be able to go home, please place an inpt rehab consult.Cleatrice Burke 07/02/2018, 10:11 PM  I can be reached at [phone].

## 2018-07-02 NOTE — Progress Notes (Signed)
4 Days Post-Op Procedure(s) (LRB): CORONARY ARTERY BYPASS GRAFTING (CABG) times  four, using left internal mammary artery, endoscopically harvested right saphenous vein, and harvested left radial artery (N/A) TRANSESOPHAGEAL ECHOCARDIOGRAM (TEE) (N/A) RADIAL ARTERY HARVEST (Left) ENDOVEIN HARVEST OF GREATER SAPHENOUS VEIN (Right) Subjective: A little more alert this morning, c/o being "a little sore"  Objective: Vital signs in last 24 hours: Temp:  [97.8 F (36.6 C)-98.8 F (37.1 C)] 98.5 F (36.9 C) (10/25 0400) Pulse Rate:  [59-82] 69 (10/25 0700) Cardiac Rhythm: Normal sinus rhythm (10/25 0400) Resp:  [0-17] 11 (10/25 0700) BP: (98-167)/(62-88) 167/81 (10/25 0700) SpO2:  [77 %-100 %] 100 % (10/25 0700) Weight:  [77.2 kg] 77.2 kg (10/25 0500)  Hemodynamic parameters for last 24 hours:    Intake/Output from previous day: 10/24 0701 - 10/25 0700 In: 1050 [P.O.:700; I.V.:100; Blood:250] Out: 1610 [Urine:1225] Intake/Output this shift: No intake/output data recorded.  General appearance: alert, cooperative and no distress Neurologic: intact Heart: regular rate and rhythm Lungs: diminished breath sounds bibasilar Abdomen: normal findings: soft, non-tender  Lab Results: Recent Labs    07/01/18 0308 07/01/18 1400 07/02/18 0340  WBC 13.2*  --  11.1*  HGB 6.9* 8.6* 7.9*  HCT 23.1* 29.2* 26.5*  PLT 267  --  309   BMET:  Recent Labs    07/01/18 0308 07/02/18 0340  NA 138 139  K 4.3 4.0  CL 106 104  CO2 26 27  GLUCOSE 75 93  BUN 32* 36*  CREATININE 1.38* 1.39*  CALCIUM 7.8* 7.7*    PT/INR: No results for input(s): LABPROT, INR in the last 72 hours. ABG    Component Value Date/Time   PHART 7.422 06/29/2018 1003   HCO3 26.3 06/29/2018 1003   TCO2 24 06/29/2018 1540   ACIDBASEDEF 3.0 (H) 06/29/2018 0236   O2SAT 99.0 06/29/2018 1003   CBG (last 3)  Recent Labs    07/01/18 1954 07/01/18 2325 07/02/18 0324  GLUCAP 125* 85 76    Assessment/Plan: S/P  Procedure(s) (LRB): CORONARY ARTERY BYPASS GRAFTING (CABG) times  four, using left internal mammary artery, endoscopically harvested right saphenous vein, and harvested left radial artery (N/A) TRANSESOPHAGEAL ECHOCARDIOGRAM (TEE) (N/A) RADIAL ARTERY HARVEST (Left) ENDOVEIN HARVEST OF GREATER SAPHENOUS VEIN (Right) Plan for transfer to step-down: see transfer orders  CV- stable in SR RESP_ Continue IS RENAL- creatinine mildly elevated, stable, volume overloaded- PO lasix ENDO- CBG well controlled Deconditioning- lack of effort with cardiac rehab- continue PT, rehab Anemia- acute on chronic- improved after transfusion Advance diet   LOS: 7 days    [name] 07/02/2018

## 2018-07-02 NOTE — Discharge Summary (Signed)
Physician Discharge Summary  Patient ID: JAZLENE BARES MRN: [id number] DOB/AGE: Jun 26, 1966 52 y.o.  Admit date: 06/23/2018 Discharge date: 07/06/2018  Admission Diagnoses:  Patient Active Problem List   Diagnosis Date Noted  . Coronary artery disease involving native coronary artery of native heart with angina pectoris ([place]) 06/25/2018  . Type 2 diabetes mellitus with vascular disease ([place]) 06/24/2018  . Acute on chronic diastolic CHF (congestive heart failure) ([place]) 06/24/2018  . Non-ST elevation (NSTEMI) myocardial infarction ([place]) 06/24/2018  . Chest pain 06/23/2018  . Glaucoma 09/17/2017  . Diabetic retinopathy ([place]) 09/17/2017  . Otitis media 11/10/2016  . Decreased visual acuity 11/10/2016  . Spinal stenosis, lumbar region with neurogenic claudication 10/22/2016  . Other spondylosis with radiculopathy, lumbar region 10/13/2016  . Lipoma of abdominal wall 10/05/2016  . Acute osteomyelitis of toe, left ([place]) 09/05/2016  . Moderate malnutrition ([place]) 09/04/2016  . Anemia of chronic disease 09/04/2016  . Diabetic foot infection ([place]) 08/26/2016  . Diabetic polyneuropathy associated with diabetes mellitus due to underlying condition ([place]) 05/07/2016  . Orthostatic hypotension 05/07/2016  . Cerebrovascular disease 04/27/2016  . ICH (intracerebral hemorrhage) ([place]) 03/30/2016  . Visit for preventive health examination 05/21/2015  . Breast cancer screening 05/21/2015  . Arm lesion 07/02/2014  . Pain in joint, ankle and foot 04/28/2014  . Pain in limb 01/13/2014  . Neuropathic pain of both legs 01/13/2014  . Hip pain 01/12/2014  . Allergic rhinitis 03/10/2013  . Back pain 03/08/2013  . Depression 12/18/2012  . PVD (peripheral vascular disease) ([place]) 01/21/2012  . Wound of left leg 12/22/2011  . Hyperlipidemia 12/06/2010  . Overweight(278.02) 12/06/2010  . RESTLESS LEG SYNDROME 10/25/2010  . Migraine without aura 10/07/2010  . Hereditary and idiopathic peripheral  neuropathy 10/07/2010  . Essential hypertension, benign 10/07/2010  . DISTURBANCE OF SKIN SENSATION 10/07/2010  . HEART MURMUR, HX OF 10/07/2010  . NEPHROLITHIASIS, HX OF 10/07/2010   Discharge Diagnoses:   Patient Active Problem List   Diagnosis Date Noted  . S/P CABG x 3 06/28/2018  . Coronary artery disease involving native coronary artery of native heart with angina pectoris ([place]) 06/25/2018  . Type 2 diabetes mellitus with vascular disease ([place]) 06/24/2018  . Acute on chronic diastolic CHF (congestive heart failure) ([place]) 06/24/2018  . Non-ST elevation (NSTEMI) myocardial infarction ([place]) 06/24/2018  . Chest pain 06/23/2018  . Glaucoma 09/17/2017  . Diabetic retinopathy ([place]) 09/17/2017  . Otitis media 11/10/2016  . Decreased visual acuity 11/10/2016  . Spinal stenosis, lumbar region with neurogenic claudication 10/22/2016  . Other spondylosis with radiculopathy, lumbar region 10/13/2016  . Lipoma of abdominal wall 10/05/2016  . Acute osteomyelitis of toe, left ([place]) 09/05/2016  . Moderate malnutrition ([place]) 09/04/2016  . Anemia of chronic disease 09/04/2016  . Diabetic foot infection ([place]) 08/26/2016  . Diabetic polyneuropathy associated with diabetes mellitus due to underlying condition ([place]) 05/07/2016  . Orthostatic hypotension 05/07/2016  . Cerebrovascular disease 04/27/2016  . ICH (intracerebral hemorrhage) ([place]) 03/30/2016  . Visit for preventive health examination 05/21/2015  . Breast cancer screening 05/21/2015  . Arm lesion 07/02/2014  . Pain in joint, ankle and foot 04/28/2014  . Pain in limb 01/13/2014  . Neuropathic pain of both legs 01/13/2014  . Hip pain 01/12/2014  . Allergic rhinitis 03/10/2013  . Back pain 03/08/2013  . Depression 12/18/2012  . PVD (peripheral vascular disease) ([place]) 01/21/2012  . Wound of left leg 12/22/2011  . Hyperlipidemia 12/06/2010  . Overweight(278.02) 12/06/2010  . RESTLESS LEG SYNDROME 10/25/2010  .  Migraine without aura  10/07/2010  . Hereditary and idiopathic peripheral neuropathy 10/07/2010  . Essential hypertension, benign 10/07/2010  . DISTURBANCE OF SKIN SENSATION 10/07/2010  . HEART MURMUR, HX OF 10/07/2010  . NEPHROLITHIASIS, HX OF 10/07/2010   Discharged Condition: Stable  History of Present Illness:  Mr. Trabert is a 52 yo white female with known history of Hypertension, hyperlipidemia, Type II Insulin Dependent DM, with vascular complications, peripheral neuropathy, depression, stroke, and chronic pain.  She presented to the ED with a 3 day complaint of chest pain and shortness of breath.  Workup ruled patient in for a NSTEMI.  She was admitted for cardiac catheterization and further care.  Hospital Course:   She remained chest pain free during hospitalization.  She underwent cardiac catheterization on 06/24/2018.  She was found to have multivessel CAD.  It was felt coronary bypass grafting would be indicated and TCTS consult was obtained.  She was evaluated by Dr. Roxan Hockey who was in agreement the patient would require coronary bypass grafting procedure.  The risks and benefits of the procedure were explained to the patient and she was agreeable to proceed.  She was taken to the operating room on 06/28/2018.  She underwent CABG x 4 utilizing LIMA to LAD, SVG to Diagonal 1 and Ramus Intermediate, and radial artery to OM.  She also underwent open radial harvest of her left arm and endoscopic harvest of greater saphenous vein from her right thigh.  She tolerated the procedure without difficulty and was taken to the SICU in stable condition.  During her stay in the SICU the patient was weaned and extubated.  She was weaned off Dopamine, NTG as tolerated.  Her arterial line and chest tubes were removed without difficulty.  She was started on Imdur for her radial artery graft.  She had a mild elevation in her creatinine level to 1.37.  She was started on low dose lasix for hypervolemia.  She had symptomatic  anemia from expected surgical blood loss.  She was transfused packed cells with improvement of symptoms.  The patient was not willing to move much due to pain.  PT consult was obtained to aid with mobility.  She was maintaining NSR and was transferred to the telemetry unit on 07/02/2018. She is ambulating on room air. She has been tolerating a diet and has had a bowel movement. Her wounds are clean, dry, and continuing to heal. Her creatinine was slightly elevated post op and was last 1.35. As a result, she was not restarted on Lisinopril. She was started on low dose Amlodipine for better BP control.  Glucose remained under good control with Metformin and Insulin. Her pre op HGA1C was 11.1. She will requires close medical follow up after discharge. She is felt surgically stable for discharge today.  Significant Diagnostic Studies: angiography:    Ost RPDA to RPDA lesion is 40% stenosed.  RPDA-1 lesion is 50% stenosed.  RPDA-2 lesion is 80% stenosed.  Prox RCA lesion is 20% stenosed.  Mid RCA lesion is 20% stenosed.  Mid LM to Dist LM lesion is 80% stenosed.  Dist LM to Prox LAD lesion is 85% stenosed.  Prox LAD to Mid LAD lesion is 80% stenosed.  Ost 2nd Diag to 2nd Diag lesion is 95% stenosed.  Mid LAD lesion is 95% stenosed.  Dist LAD lesion is 99% stenosed.  Prox Cx to Mid Cx lesion is 60% stenosed.  LV end diastolic pressure is severely elevated.  There is moderate left ventricular systolic  dysfunction.   Severe multivessel CAD with 80% distal left main stenosis, calcification of the proximal LAD with diffuse 85 to 90% stenosis before the first diagonal vessel, 80% stenoses between the first and second diagonal vessel with 95% ostial stenosis of the second diagonal and LAD beyond the diagonal vessel; the LAD is subtotally occluded in the distal LAD after the third diagonal vessel with faint filling antegrade; 60% proximal left circumflex stenosis with 50% distal circumflex  marginal stenosis; large dominant RCA with 20% tandem mid RCA stenoses, and PDA stenosis of 40% ostially 50% in the mid segment and 80% distally.  Moderate LV dysfunction with an ejection fraction of 40 to 45% with hypocontractility involving the mid distal anterolateral wall extending to the apex.  LVEDP is elevated at 29 mm.  Treatments: surgery:    Median sternotomy, extracorporeal circulation coronary artery bypass grafting x4 (left internal mammary artery to left anterior descending, saphenous vein graft to first diagonal and an anterolateral 1,  left radial artery to OM1  (radial harvest, endoscopic vein harvest  Discharge Exam: Blood pressure 137/78, pulse 84, temperature 98.1 F (36.7 C), temperature source Oral, resp. rate 15, height 5' 2.01" (1.575 m), weight 73 kg, last menstrual period 02/12/2011, SpO2 94 %. Cardiovascular: RRR Pulmonary: Clear to auscultation bilaterally Abdomen: Soft, non tender, bowel sounds present. Extremities: No lower extremity edema. Motor/sensory intact LUE. Wounds: Clean and dry.  No erythema or signs of infection.  Disposition: Home   Allergies as of 07/06/2018      Reactions   Lyrica [pregabalin] Other (See Comments)   MADE PATIENT VERY EMOTIONAL AND WOULD CRY EASILY   Morphine And Related Nausea And Vomiting   Tramadol Nausea And Vomiting   Pt cant tolerate this pain med.       Medication List    STOP taking these medications   doxycycline 100 MG capsule Commonly known as:  VIBRAMYCIN   furosemide 20 MG tablet Commonly known as:  LASIX   lisinopril 20 MG tablet Commonly known as:  PRINIVIL,ZESTRIL   meloxicam 7.5 MG tablet Commonly known as:  MOBIC   simvastatin 10 MG tablet Commonly known as:  ZOCOR     TAKE these medications   acetaminophen 325 MG tablet Commonly known as:  TYLENOL Take 2 tablets (650 mg total) by mouth every 6 (six) hours as needed for mild pain or headache.   amLODipine 5 MG tablet Commonly known as:   NORVASC Take 1 tablet (5 mg total) by mouth daily.   aspirin 325 MG EC tablet Take 1 tablet (325 mg total) by mouth daily. What changed:    medication strength  how much to take   atorvastatin 80 MG tablet Commonly known as:  LIPITOR Take 1 tablet (80 mg total) by mouth daily at 6 PM.   azelastine 0.1 % nasal spray Commonly known as:  ASTELIN Place 2 sprays into both nostrils 2 (two) times daily. Use in each nostril as directed What changed:    when to take this  reasons to take this  additional instructions   escitalopram 10 MG tablet Commonly known as:  LEXAPRO Take 1 tablet (10 mg total) by mouth daily.   fluticasone 50 MCG/ACT nasal spray Commonly known as:  FLONASE Place 2 sprays into both nostrils daily. What changed:    when to take this  reasons to take this   gabapentin 100 MG capsule Commonly known as:  NEURONTIN Take 3 capsules (300 mg total) by mouth 3 (three) times  daily.   HYDROcodone-acetaminophen 10-325 MG tablet Commonly known as:  NORCO Take one tablet every 4-6 hours PRN severe pain What changed:    how much to take  how to take this  when to take this  reasons to take this  additional instructions   isosorbide mononitrate 30 MG 24 hr tablet Commonly known as:  IMDUR Take 0.5 tablets (15 mg total) by mouth daily. For one month then stop.   LANTUS SOLOSTAR 100 UNIT/ML Solostar Pen Generic drug:  Insulin Glargine 100 units sq in am and 50 units sq in pm What changed:    how much to take  how to take this  when to take this  additional instructions   levothyroxine 25 MCG tablet Commonly known as:  SYNTHROID, LEVOTHROID Take 1 tablet (25 mcg total) by mouth daily before breakfast.   metFORMIN 1000 MG tablet Commonly known as:  GLUCOPHAGE TAKE 1 TABLET TWICE DAILY AND 1/2 TABLET AT NOON What changed:  See the new instructions.   metoprolol tartrate 25 MG tablet Commonly known as:  LOPRESSOR Take 1 tablet (25 mg  total) by mouth 2 (two) times daily.   ONE TOUCH ULTRA TEST test strip Generic drug:  glucose blood USE ONCE DAILY TO CHECK BLOOD SUGAR. DX E11.9   ONE TOUCH ULTRA TEST test strip Generic drug:  glucose blood USE ONCE DAILY TO CHECK BLOOD SUGAR. DX E11.9   silver sulfADIAZINE 1 % cream Commonly known as:  SILVADENE Apply 1 application topically daily.   tizanidine 2 MG capsule Commonly known as:  ZANAFLEX Take 1 capsule (2 mg total) by mouth 2 (two) times daily as needed for muscle spasms.            Durable Medical Equipment  (From admission, onward)         Start     Ordered   07/06/18 0731  For home use only DME 3 n 1  Once     07/06/18 0730   07/06/18 0730  For home use only DME Walker rolling  Once    Comments:  Rolling walker with 5" wheels  Question:  Patient needs a walker to treat with the following condition  Answer:  Physical deconditioning   07/06/18 0730         Discharge Medications:  The patient has been discharged on:   1.Beta Blocker:  Yes [ X  ]                              No   [   ]                              If No, reason:  2.Ace Inhibitor/ARB: Yes [   ]                                     No  [   x ]                                     If No, reason:Elevated creatinine  3.Statin:   Yes [ X  ]                  No  [   ]  If No, reason:  4.Ecasa:  Yes  [ X  ]                  No   [   ]                  If No, reason:   Follow-up Information    [name], MD Follow up on 08/03/2018.   Specialty:  Cardiothoracic Surgery Why:  Appointment is at 2:45, please get CXR at 2:15 at [facility] located on first floor of our office building Contact information: [place] [address] [phone]        Mosie Lukes, MD. Call.   Specialty:  Family Medicine Why:  for a follow up appointment regarding further diabetes management and surveillance of HGA1C 11.1 Contact  information: [place] [address] [phone]        Lendon Colonel, NP. Go on 07/19/2018.   Specialties:  Nurse Practitioner, Radiology, Cardiology Why:  Appointment time is at 2:30 pm Contact information: [address] [place] [address] [phone]           Signed: Arnoldo Lenis 07/06/2018, 8:59 AM

## 2018-07-02 NOTE — Progress Notes (Signed)
   Note reviewed.  Continue to progress slowly.  Physical therapy.  Rehab.  Anemia improved.  Now on 4 E.  Candee Furbish, MD

## 2018-07-02 NOTE — Discharge Instructions (Signed)

## 2018-07-03 ENCOUNTER — Encounter (HOSPITAL_COMMUNITY): Payer: Self-pay | Admitting: Cardiac Rehabilitation

## 2018-07-03 ENCOUNTER — Inpatient Hospital Stay (HOSPITAL_COMMUNITY): Payer: BLUE CROSS/BLUE SHIELD

## 2018-07-03 LAB — BASIC METABOLIC PANEL
Anion gap: 8 (ref 5–15)
BUN: 37 mg/dL — ABNORMAL HIGH (ref 6–20)
CO2: 25 mmol/L (ref 22–32)
Calcium: 7.7 mg/dL — ABNORMAL LOW (ref 8.9–10.3)
Chloride: 105 mmol/L (ref 98–111)
Creatinine, Ser: 1.57 mg/dL — ABNORMAL HIGH (ref 0.44–1.00)
GFR calc Af Amer: 42 mL/min — ABNORMAL LOW (ref >60)
GFR calc non Af Amer: 36 mL/min — ABNORMAL LOW (ref >60)
Glucose, Bld: 170 mg/dL — ABNORMAL HIGH (ref 70–99)
Potassium: 4.5 mmol/L (ref 3.5–5.1)
Sodium: 138 mmol/L (ref 135–145)

## 2018-07-03 LAB — CBC
HCT: 27.5 % — ABNORMAL LOW (ref 36.0–46.0)
Hemoglobin: 8.3 g/dL — ABNORMAL LOW (ref 12.0–15.0)
MCH: 26.3 pg (ref 26.0–34.0)
MCHC: 30.2 g/dL (ref 30.0–36.0)
MCV: 87 fL (ref 80.0–100.0)
Platelets: 355 10*3/uL (ref 150–400)
RBC: 3.16 MIL/uL — ABNORMAL LOW (ref 3.87–5.11)
RDW: 15.4 % (ref 11.5–15.5)
WBC: 11.7 10*3/uL — ABNORMAL HIGH (ref 4.0–10.5)
nRBC: 0 % (ref 0.0–0.2)

## 2018-07-03 LAB — GLUCOSE, CAPILLARY
Glucose-Capillary: 154 mg/dL — ABNORMAL HIGH (ref 70–99)
Glucose-Capillary: 205 mg/dL — ABNORMAL HIGH (ref 70–99)
Glucose-Capillary: 202 mg/dL — ABNORMAL HIGH (ref 70–99)
Glucose-Capillary: 112 mg/dL — ABNORMAL HIGH (ref 70–99)

## 2018-07-03 MED ORDER — METOLAZONE 5 MG PO TABS
5.0000 mg | ORAL_TABLET | Freq: Once | ORAL | Status: AC
  Administered 2018-07-04: 5 mg via ORAL
  Filled 2018-07-03: qty 1

## 2018-07-03 MED ORDER — SORBITOL 70 % PO SOLN
15.0000 mL | Freq: Once | ORAL | Status: AC
  Administered 2018-07-03: 15 mL via ORAL
  Filled 2018-07-03: qty 15

## 2018-07-03 NOTE — Evaluation (Addendum)
Occupational Therapy Evaluation Patient Details Name: [name] MRN: [id number] DOB: 11-29-1965 Today's Date: 07/03/2018    History of Present Illness Pt is a 52 y.o. F with significant PMH of diabetes mellitus type 2, stroke, diastolic CHF, peripheral vascular disease, depression who presents to ER with chest pain and SOB and ruled in for NSTEMI. Now s/p coronary artery bypass grafting x 4.   Clinical Impression   PTA, pt was independent with ADL and functional mobility and working. She will have support from her son post-acute D/C. She currently requires min guard assist for functional mobility, max assist for LB ADL, max assist for toileting hygiene, and supervision for UB ADL. She required cues to adhere to sternal precautions during mobility and was abel to ambulate in hallway this date to address activity tolerance for ADL participation. Pt would benefit from continued OT services while admitted to improve independence and safety with ADL and functional mobility. Recommend CIR level therapies post-acute D/C to maximize return to independent PLOF.    Follow Up Recommendations  Supervision/Assistance - 24 hour;CIR    Equipment Recommendations  3 in 1 bedside commode;None recommended by OT    Recommendations for Other Services       Precautions / Restrictions Precautions Precautions: Fall;Sternal Precaution Booklet Issued: Yes (comment) Precaution Comments: previously provided by PT Restrictions Weight Bearing Restrictions: No Other Position/Activity Restrictions: sternal precautions      Mobility Bed Mobility Overal bed mobility: Needs Assistance Bed Mobility: Rolling;Sidelying to Sit Rolling: Min guard Sidelying to sit: Min assist       General bed mobility comments: Cues for log roll with min assist to raise trunk from [facility] [place].   Transfers Overall transfer level: Needs assistance Equipment used: Rolling walker (2 wheeled) Transfers: Sit to/from Stand Sit to  Stand: Min guard         General transfer comment: Guarding assist with cues for sternal precautions.     Balance Overall balance assessment: Needs assistance Sitting-balance support: No upper extremity supported;Feet supported Sitting balance-Leahy Scale: Good     Standing balance support: Bilateral upper extremity supported Standing balance-Leahy Scale: Fair                             ADL either performed or assessed with clinical judgement   ADL Overall ADL's : Needs assistance/impaired Eating/Feeding: Set up;Sitting   Grooming: Set up;Sitting   Upper Body Bathing: Sitting;Supervision/ safety   Lower Body Bathing: Maximal assistance;Sit to/from stand   Upper Body Dressing : Supervision/safety;Sitting   Lower Body Dressing: Maximal assistance;Sit to/from stand   Toilet Transfer: Min guard;Ambulation;RW Toilet Transfer Details (indicate cue type and reason): ambulating in room and hallway Toileting- Clothing Manipulation and Hygiene: Maximal assistance;Sit to/from stand       Functional mobility during ADLs: Min guard;Rolling walker General ADL Comments: Guarding assist for stability with RW.      Vision Baseline Vision/History: Wears glasses Wears Glasses: At all times Patient Visual Report: No change from baseline Vision Assessment?: No apparent visual deficits     Perception     Praxis      Pertinent Vitals/Pain Pain Assessment: Faces Faces Pain Scale: Hurts a little bit Pain Location: incisional Pain Descriptors / Indicators: Grimacing Pain Intervention(s): Monitored during session     Hand Dominance     Extremity/Trunk Assessment Upper Extremity Assessment Upper Extremity Assessment: Overall WFL for tasks assessed   Lower Extremity Assessment Lower Extremity Assessment: Overall WFL for  tasks assessed   Cervical / Trunk Assessment Cervical / Trunk Assessment: Normal   Communication Communication Communication: No  difficulties   Cognition Arousal/Alertness: Awake/alert Behavior During Therapy: WFL for tasks assessed/performed Overall Cognitive Status: Impaired/Different from baseline Area of Impairment: Attention;Awareness                   Current Attention Level: Selective       Awareness: Emergent   General Comments: Pt very sleepy today with difficulty processing noted.    General Comments  RN present providing medications    Exercises     Shoulder Instructions      Home Living Family/patient expects to be discharged to:: Private residence Living Arrangements: Children(son coming in to stay with her post-op) Available Help at Discharge: Family;Available PRN/intermittently Type of Home: House Home Access: Ramped entrance     Home Layout: One level     Bathroom Shower/Tub: Tub/shower unit;Walk-in shower   Bathroom Toilet: Standard     Home Equipment: Environmental consultant - 4 wheels;Bedside commode;Shower seat(equipment was her husband's)          Prior Functioning/Environment Level of Independence: Independent        Comments: Works with machinery         OT Problem List: Decreased strength;Decreased range of motion;Impaired balance (sitting and/or standing);Decreased activity tolerance;Decreased safety awareness;Decreased knowledge of use of DME or AE;Decreased knowledge of precautions;Pain      OT Treatment/Interventions: Self-care/ADL training;Therapeutic exercise;Energy conservation;DME and/or AE instruction;Therapeutic activities;Patient/family education;Balance training    OT Goals(Current goals can be found in the care plan section) Acute Rehab OT Goals Patient Stated Goal: "go back to work in 6 months." OT Goal Formulation: With patient Time For Goal Achievement: 07/17/18 Potential to Achieve Goals: Good ADL Goals Pt Will Perform Grooming: standing;with modified independence Pt Will Perform Upper Body Dressing: with modified independence;sitting Pt Will  Perform Lower Body Dressing: with modified independence;sit to/from stand;with adaptive equipment Pt Will Transfer to Toilet: with modified independence;ambulating;bedside commode Pt Will Perform Toileting - Clothing Manipulation and hygiene: with modified independence;with adaptive equipment;sit to/from stand Additional ADL Goal #1: Pt will verbalize sternal precautions and generalize these into daily ADL routine independently.  OT Frequency: Min 2X/week   Barriers to D/C:            Co-evaluation              AM-PAC PT "6 Clicks" Daily Activity     Outcome Measure Help from another person eating meals?: None Help from another person taking care of personal grooming?: None Help from another person toileting, which includes using toliet, bedpan, or urinal?: A Lot Help from another person bathing (including washing, rinsing, drying)?: A Lot Help from another person to put on and taking off regular upper body clothing?: None Help from another person to put on and taking off regular lower body clothing?: A Lot 6 Click Score: 18   End of Session Equipment Utilized During Treatment: Gait belt;Rolling walker Nurse Communication: Mobility status  Activity Tolerance: Patient tolerated treatment well Patient left: in chair;with call bell/phone within reach  OT Visit Diagnosis: Other abnormalities of gait and mobility (R26.89);Muscle weakness (generalized) (M62.81);Pain Pain - part of body: (sternal)                Time: 9741-6384 OT Time Calculation (min): 23 min Charges:  OT General Charges $OT Visit: 1 Visit OT Evaluation $OT Eval Moderate Complexity: 1 Mod OT Treatments $Self Care/Home Management : 8-22 mins  [facility]  Tobey Bride, OTR/L Acute Rehabilitation Services Office [place] 07/03/2018, 3:05 PM

## 2018-07-03 NOTE — Progress Notes (Addendum)
      [address]       [place] [address]             [phone]      5 Days Post-Op Procedure(s) (LRB): CORONARY ARTERY BYPASS GRAFTING (CABG) times  four, using left internal mammary artery, endoscopically harvested right saphenous vein, and harvested left radial artery (N/A) TRANSESOPHAGEAL ECHOCARDIOGRAM (TEE) (N/A) RADIAL ARTERY HARVEST (Left) ENDOVEIN HARVEST OF GREATER SAPHENOUS VEIN (Right) Subjective: Feels okay this morning. She is working with physical therapy for deconsitioning  Objective: Vital signs in last 24 hours: Temp:  [96.8 F (36 C)-98.1 F (36.7 C)] 96.8 F (36 C) (10/26 0548) Pulse Rate:  [59-79] 68 (10/26 0548) Cardiac Rhythm: Normal sinus rhythm (10/25 2155) Resp:  [13-18] 13 (10/25 2118) BP: (102-165)/(66-84) 129/69 (10/26 0548) SpO2:  [90 %-100 %] 94 % (10/26 0548) Weight:  [76.9 kg] 76.9 kg (10/26 0548)     Intake/Output from previous day: 10/25 0701 - 10/26 0700 In: 460 [P.O.:460] Out: 1375 [Urine:1375] Intake/Output this shift: No intake/output data recorded.  General appearance: alert, cooperative and no distress Heart: regular rate and rhythm, S1, S2 normal, no murmur, click, rub or gallop Lungs: clear to auscultation bilaterally Abdomen: soft, non-tender; bowel sounds normal; no masses,  no organomegaly Extremities: 1-2+ non pitting edema in lower and upper extremity Wound: clean and dry  Lab Results: Recent Labs    07/02/18 0340 07/03/18 0449  WBC 11.1* 11.7*  HGB 7.9* 8.3*  HCT 26.5* 27.5*  PLT 309 355   BMET:  Recent Labs    07/02/18 0340 07/03/18 0449  NA 139 138  K 4.0 4.5  CL 104 105  CO2 27 25  GLUCOSE 93 170*  BUN 36* 37*  CREATININE 1.39* 1.57*  CALCIUM 7.7* 7.7*    PT/INR: No results for input(s): LABPROT, INR in the last 72 hours. ABG    Component Value Date/Time   PHART 7.422 06/29/2018 1003   HCO3 26.3 06/29/2018 1003   TCO2 24 06/29/2018 1540   ACIDBASEDEF 3.0 (H) 06/29/2018  0236   O2SAT 99.0 06/29/2018 1003   CBG (last 3)  Recent Labs    07/02/18 1614 07/02/18 2115 07/03/18 0616  GLUCAP 156* 202* 154*    Assessment/Plan: S/P Procedure(s) (LRB): CORONARY ARTERY BYPASS GRAFTING (CABG) times  four, using left internal mammary artery, endoscopically harvested right saphenous vein, and harvested left radial artery (N/A) TRANSESOPHAGEAL ECHOCARDIOGRAM (TEE) (N/A) RADIAL ARTERY HARVEST (Left) ENDOVEIN HARVEST OF GREATER SAPHENOUS VEIN (Right)  1. CV-NSR in the 60s, BP stable. Continue ASA, statin, and BB.  2. Pulm-tolerating room air with good oxygen saturation. CXR stable, will await official read. No pleural effusion or pneumothorax. 3. Renal-creatinine 1.57 which is trending up- will monitor. On lasix 40mg  PO daily. Weight is trending down. 4. H and H stable, expected acute blood loss anemia 5. Endo-blood glucose moderately controlled. One high outlier reading 6. Lovenox for DVT prophylaxis, would hold mobic in the perioperative period  Plan: Ambulate in the halls. In and out cath this morning but used the bathing on her own afterwards. Encouraged incentive spirometer. Continue diuresis for fluid overload. Watch creatinine as it is rising slowly.    LOS: 8 days    [name] 07/03/2018    walked in hall needs more diuresis- metolazone ordered  patient examined and medical record reviewed,agree with above note. [name] 07/03/2018

## 2018-07-03 NOTE — Progress Notes (Signed)
Pt only void 158ml in the beginning of this shift I bladder scan her got 505 on call [facility] notify N.O to straight cath output 800 ml Amber clear.

## 2018-07-03 NOTE — Progress Notes (Signed)
CARDIAC REHAB PHASE I   Pt ambulating in hallway with Physical therapy.  Pt returned to chair. Pt declined offer for ambulation at this time.   Education completed including risk factor modification, low fat-low cholesterol diet, exercise, and medication compliance.  Pt oriented to outpatient cardiac rehab.  At pt request, referral will be sent to [place].  Understanding verbalized .Andi Hence, RN, BSN Cardiac Pulmonary Rehab 07/03/18  12:17 PM  [place]

## 2018-07-04 ENCOUNTER — Inpatient Hospital Stay (HOSPITAL_COMMUNITY): Payer: BLUE CROSS/BLUE SHIELD

## 2018-07-04 LAB — CBC
HCT: 28.3 % — ABNORMAL LOW (ref 36.0–46.0)
Hemoglobin: 8.8 g/dL — ABNORMAL LOW (ref 12.0–15.0)
MCH: 26.5 pg (ref 26.0–34.0)
MCHC: 31.1 g/dL (ref 30.0–36.0)
MCV: 85.2 fL (ref 80.0–100.0)
Platelets: 432 10*3/uL — ABNORMAL HIGH (ref 150–400)
RBC: 3.32 MIL/uL — ABNORMAL LOW (ref 3.87–5.11)
RDW: 15.1 % (ref 11.5–15.5)
WBC: 9.4 10*3/uL (ref 4.0–10.5)
nRBC: 0 % (ref 0.0–0.2)

## 2018-07-04 LAB — BASIC METABOLIC PANEL
Anion gap: 8 (ref 5–15)
BUN: 33 mg/dL — ABNORMAL HIGH (ref 6–20)
CO2: 25 mmol/L (ref 22–32)
Calcium: 7.8 mg/dL — ABNORMAL LOW (ref 8.9–10.3)
Chloride: 104 mmol/L (ref 98–111)
Creatinine, Ser: 1.44 mg/dL — ABNORMAL HIGH (ref 0.44–1.00)
GFR calc Af Amer: 47 mL/min — ABNORMAL LOW (ref >60)
GFR calc non Af Amer: 40 mL/min — ABNORMAL LOW (ref >60)
Glucose, Bld: 154 mg/dL — ABNORMAL HIGH (ref 70–99)
Potassium: 4.4 mmol/L (ref 3.5–5.1)
Sodium: 137 mmol/L (ref 135–145)

## 2018-07-04 LAB — GLUCOSE, CAPILLARY
Glucose-Capillary: 134 mg/dL — ABNORMAL HIGH (ref 70–99)
Glucose-Capillary: 196 mg/dL — ABNORMAL HIGH (ref 70–99)
Glucose-Capillary: 221 mg/dL — ABNORMAL HIGH (ref 70–99)
Glucose-Capillary: 199 mg/dL — ABNORMAL HIGH (ref 70–99)

## 2018-07-04 MED ORDER — METOPROLOL TARTRATE 25 MG/10 ML ORAL SUSPENSION: 12.5000 mg | Freq: Two times a day (BID) | ORAL | Status: DC

## 2018-07-04 MED ORDER — METOPROLOL TARTRATE 25 MG PO TABS
25.0000 mg | ORAL_TABLET | Freq: Two times a day (BID) | ORAL | Status: DC
  Administered 2018-07-04 – 2018-07-06 (×5): 25 mg via ORAL
  Filled 2018-07-04 (×5): qty 1

## 2018-07-04 NOTE — Progress Notes (Signed)
      [address]       [place] [address]             [phone]      6 Days Post-Op Procedure(s) (LRB): CORONARY ARTERY BYPASS GRAFTING (CABG) times  four, using left internal mammary artery, endoscopically harvested right saphenous vein, and harvested left radial artery (N/A) TRANSESOPHAGEAL ECHOCARDIOGRAM (TEE) (N/A) RADIAL ARTERY HARVEST (Left) ENDOVEIN HARVEST OF GREATER SAPHENOUS VEIN (Right) Subjective: No issues overnight. She has already walked this morning. Off oxygen and using her incentive spirometer.   Objective: Vital signs in last 24 hours: Temp:  [97.6 F (36.4 C)-97.8 F (36.6 C)] 97.8 F (36.6 C) (10/27 0556) Pulse Rate:  [71-79] 76 (10/27 0556) Cardiac Rhythm: Normal sinus rhythm (10/26 2000) Resp:  [15-18] 18 (10/27 0556) BP: (113-164)/(74-82) 164/82 (10/27 0556) SpO2:  [97 %-100 %] 97 % (10/27 0556) Weight:  [77.1 kg] 77.1 kg (10/27 0556)     Intake/Output from previous day: 10/26 0701 - 10/27 0700 In: 3 [I.V.:3] Out: 700 [Urine:700] Intake/Output this shift: No intake/output data recorded.  General appearance: alert, cooperative and no distress Heart: regular rate and rhythm, S1, S2 normal, no murmur, click, rub or gallop Lungs: clear to auscultation bilaterally and diminshed in the lower lobes Abdomen: soft, non-tender; bowel sounds normal; no masses,  no organomegaly Extremities: upper and lower extremity diffuse edema Wound: clean and dry  Lab Results: Recent Labs    07/03/18 0449 07/04/18 0405  WBC 11.7* 9.4  HGB 8.3* 8.8*  HCT 27.5* 28.3*  PLT 355 432*   BMET:  Recent Labs    07/03/18 0449 07/04/18 0405  NA 138 137  K 4.5 4.4  CL 105 104  CO2 25 25  GLUCOSE 170* 154*  BUN 37* 33*  CREATININE 1.57* 1.44*  CALCIUM 7.7* 7.8*    PT/INR: No results for input(s): LABPROT, INR in the last 72 hours. ABG    Component Value Date/Time   PHART 7.422 06/29/2018 1003   HCO3 26.3 06/29/2018 1003   TCO2 24  06/29/2018 1540   ACIDBASEDEF 3.0 (H) 06/29/2018 0236   O2SAT 99.0 06/29/2018 1003   CBG (last 3)  Recent Labs    07/03/18 1634 07/03/18 2159 07/04/18 0603  GLUCAP 202* 112* 134*    Assessment/Plan: S/P Procedure(s) (LRB): CORONARY ARTERY BYPASS GRAFTING (CABG) times  four, using left internal mammary artery, endoscopically harvested right saphenous vein, and harvested left radial artery (N/A) TRANSESOPHAGEAL ECHOCARDIOGRAM (TEE) (N/A) RADIAL ARTERY HARVEST (Left) ENDOVEIN HARVEST OF GREATER SAPHENOUS VEIN (Right)   1. CV-NSR in the 80s, BP stable. Continue ASA, statin, and BB. Epicardial pacing wires removed. Increase Metoprolol to 25mg  BID. Holding lisinopril for elevated creatinine.  2. Pulm-tolerating room air with good oxygen saturation. CXR stable, will await official read. No pleural effusion or pneumothorax. 3. Renal-creatinine 1.44 which is trending down- will monitor. On lasix 40mg  PO daily and metolazone added. Weight appears to be at baseline but remains fluid overloaded.  4. H and H stable, expected acute blood loss anemia 5. Endo-blood glucose moderately controlled. One high outlier reading 6. Lovenox for DVT prophylaxis  Plan: Continue diuresis. Continue ambulation TID. Continue to use incentive spirometer. EPW are out. Increase metoprolol, holding ACEI due to renal function.     LOS: 9 days    Elgie Collard 07/04/2018

## 2018-07-04 NOTE — Progress Notes (Signed)
Removed epicardial wires per order. 4 intact.  Pt tolerated procedure well.  Pt instructed to remain on bedrest for one hour.  Frequent vitals will be taken and documented. Pt resting with call bell within reach. Payton Emerald, RN

## 2018-07-04 NOTE — Progress Notes (Signed)
Pt ambulated in hall about 500 ft. On room air using front wheel walker. Pt reports feeling a little fatigued with walking. Heart rate slightly tachycardia in the 100-110's but back down to baseline once back in bed. Overall pt tolerated ambulation well.

## 2018-07-04 NOTE — Progress Notes (Signed)
Pt refusing ambulation except going to bathroom.  Pt continues to sleep majority of day. Pt stating that she misses her husband who has recently passed away. She feels overwhelmed with family situation and finances. When encouraged to speak to counselor or chaplin at hospital, pt refused stating that she was OK and her "work would take care of all that". Pt states she will walk more tomorrow but does not feel up to it today. Payton Emerald, RN

## 2018-07-05 LAB — GLUCOSE, CAPILLARY
Glucose-Capillary: 158 mg/dL — ABNORMAL HIGH (ref 70–99)
Glucose-Capillary: 193 mg/dL — ABNORMAL HIGH (ref 70–99)
Glucose-Capillary: 165 mg/dL — ABNORMAL HIGH (ref 70–99)
Glucose-Capillary: 157 mg/dL — ABNORMAL HIGH (ref 70–99)

## 2018-07-05 LAB — BASIC METABOLIC PANEL
Anion gap: 9 (ref 5–15)
BUN: 22 mg/dL — ABNORMAL HIGH (ref 6–20)
CO2: 28 mmol/L (ref 22–32)
Calcium: 8.4 mg/dL — ABNORMAL LOW (ref 8.9–10.3)
Chloride: 99 mmol/L (ref 98–111)
Creatinine, Ser: 1.11 mg/dL — ABNORMAL HIGH (ref 0.44–1.00)
GFR calc Af Amer: >60 mL/min (ref >60)
GFR calc non Af Amer: 55 mL/min — ABNORMAL LOW (ref >60)
Glucose, Bld: 174 mg/dL — ABNORMAL HIGH (ref 70–99)
Potassium: 4.6 mmol/L (ref 3.5–5.1)
Sodium: 136 mmol/L (ref 135–145)

## 2018-07-05 MED ORDER — METFORMIN HCL 500 MG PO TABS
500.0000 mg | ORAL_TABLET | Freq: Two times a day (BID) | ORAL | Status: DC
  Administered 2018-07-05 – 2018-07-06 (×3): 500 mg via ORAL
  Filled 2018-07-05 (×3): qty 1

## 2018-07-05 MED ORDER — AMLODIPINE BESYLATE 5 MG PO TABS
5.0000 mg | ORAL_TABLET | Freq: Every day | ORAL | Status: DC
  Administered 2018-07-05 – 2018-07-06 (×2): 5 mg via ORAL
  Filled 2018-07-05 (×2): qty 1

## 2018-07-05 MED ORDER — ACETAMINOPHEN 325 MG PO TABS
650.0000 mg | ORAL_TABLET | Freq: Four times a day (QID) | ORAL | Status: DC | PRN
  Administered 2018-07-05: 650 mg via ORAL
  Filled 2018-07-05: qty 2

## 2018-07-05 NOTE — Progress Notes (Addendum)
      [address]       [place] [address]             [phone]        7 Days Post-Op Procedure(s) (LRB): CORONARY ARTERY BYPASS GRAFTING (CABG) times  four, using left internal mammary artery, endoscopically harvested right saphenous vein, and harvested left radial artery (N/A) TRANSESOPHAGEAL ECHOCARDIOGRAM (TEE) (N/A) RADIAL ARTERY HARVEST (Left) ENDOVEIN HARVEST OF GREATER SAPHENOUS VEIN (Right)  Subjective: Patient just waking up. She has no specific complaints at this time.  Objective: Vital signs in last 24 hours: Temp:  [98 F (36.7 C)-98.4 F (36.9 C)] 98.4 F (36.9 C) (10/28 0442) Pulse Rate:  [83-87] 87 (10/28 0442) Cardiac Rhythm: Normal sinus rhythm (10/27 2037) Resp:  [16-20] 18 (10/28 0442) BP: (144-168)/(77-95) 159/90 (10/28 0442) SpO2:  [92 %-98 %] 92 % (10/28 0442) Weight:  [75.4 kg] 75.4 kg (10/28 0436)  Pre op weight 76.4 kg Current Weight  07/05/18 75.4 kg      Intake/Output from previous day: 10/27 0701 - 10/28 0700 In: 1443 [P.O.:1440; I.V.:3] Out: 800 [Urine:800]   Physical Exam:  Cardiovascular: RRR Pulmonary: Clear to auscultation bilaterally Abdomen: Soft, non tender, bowel sounds present. Extremities: Trace bilateral lower extremity edema. Motor/sensory intact LUE. Wounds: Clean and dry.  No erythema or signs of infection.  Lab Results: CBC: Recent Labs    07/03/18 0449 07/04/18 0405  WBC 11.7* 9.4  HGB 8.3* 8.8*  HCT 27.5* 28.3*  PLT 355 432*   BMET:  Recent Labs    07/03/18 0449 07/04/18 0405  NA 138 137  K 4.5 4.4  CL 105 104  CO2 25 25  GLUCOSE 170* 154*  BUN 37* 33*  CREATININE 1.57* 1.44*  CALCIUM 7.7* 7.8*    PT/INR:  Lab Results  Component Value Date   INR 1.30 06/28/2018   INR 1.13 06/26/2018   INR 1.26 06/24/2018   ABG:  INR: Will add last result for INR, ABG once components are confirmed Will add last 4 CBG results once components are confirmed  Assessment/Plan:  1.  CV - SR. Hypertensive. On Lopressor 25 mg bid and Imdur 15 mg daily. Will not restart Lisinopril until creatinine more normal. Will give Amlodipine this am 2.  Pulmonary - On room air. Encourage incentive spirometer. 3. Volume Overload - On Lasix 40 mg daily 4.  Acute blood loss anemia - H and H stable at 8.8 and 28.3 5. Last creatinine 1.44. Will recheck  6. Hypothyroidism Levothyroxine 25 mcg daily 7. DM-CBGs 221/199/158. On Insulin. Will restart Metformin for better glucose control. Pre op HGA1C 11.1. She will need close follow up with medical doctor. 8. Will discuss disposition with Dr. Edman Circle M ZimmermanPA-C 07/05/2018,7:34 AM [phone]  Patient seen and examined, agree with above Making progress Agree with resuming metformin Possibly home tomorrow  Revonda Standard. Roxan Hockey, MD [facility] [phone]

## 2018-07-05 NOTE — Progress Notes (Signed)
Inpatient Rehabilitation Admissions Coordinator  Patient has progressed well with PT and OT for which Home health is now recommended. I will sign off at this time.  Danne Baxter, RN, MSN Rehab Admissions Coordinator [phone] 07/05/2018 3:52 PM

## 2018-07-05 NOTE — Progress Notes (Signed)
Occupational Therapy Treatment Patient Details Name: ZAMANI CROCKER MRN: [id number] DOB: Jan 07, 1966 Today's Date: 07/05/2018    History of present illness Pt is a 52 y.o. F with significant PMH of diabetes mellitus type 2, stroke, diastolic CHF, peripheral vascular disease, depression who presents to ER with chest pain and SOB and ruled in for NSTEMI. Now s/p coronary artery bypass grafting x 4.   OT comments  Focus of session on educating pt in LB bathing and dressing using AE. Pt with improved static standing balance for managing clothing with toileting and ability to stand without UE use from chair/BSC. Reports increased soreness in incision today.   Follow Up Recommendations  Home health OT;Supervision/Assistance - 24 hour    Equipment Recommendations  3 in 1 bedside commode    Recommendations for Other Services      Precautions / Restrictions Precautions Precautions: Fall;Sternal Precaution Booklet Issued: Yes (comment) Precaution Comments: pt able to generalize sternal precautions in mobility Restrictions Weight Bearing Restrictions: Yes(sternal precautions) Other Position/Activity Restrictions: sternal precautions       Mobility Bed Mobility               General bed mobility comments: pt received in chair  Transfers Overall transfer level: Needs assistance Equipment used: None Transfers: Sit to/from Stand Sit to Stand: Min guard         General transfer comment: pt self cues to not use hands with sit<>stand    Balance Overall balance assessment: Needs assistance Sitting-balance support: No upper extremity supported;Feet supported Sitting balance-Leahy Scale: Good     Standing balance support: Bilateral upper extremity supported Standing balance-Leahy Scale: Fair Standing balance comment: can release walker in static standing to manage pants                           ADL either performed or assessed with clinical judgement   ADL  Overall ADL's : Needs assistance/impaired               Lower Body Bathing Details (indicate cue type and reason): educated in use of long handled bath sponge, pt has one at home     Lower Body Dressing: Min guard;Sit to/from stand Lower Body Dressing Details (indicate cue type and reason): educated and practiced use of sock aide and reacher, simulated donning pants with reacher Toilet Transfer: Min guard;Ambulation;RW;BSC   Toileting- Water quality scientist and Hygiene: Sit to/from stand;Minimal assistance       Functional mobility during ADLs: Therapist, art      Cognition Arousal/Alertness: Awake/alert Behavior During Therapy: WFL for tasks assessed/performed Overall Cognitive Status: Within Functional Limits for tasks assessed                                Exercises     Shoulder Instructions       General Comments      Pertinent Vitals/ Pain       Pain Assessment: Faces Faces Pain Scale: Hurts little more Pain Location: incisional Pain Descriptors / Indicators: Grimacing;Guarding;Sore Pain Intervention(s): Monitored during session  Home Living  Prior Functioning/Environment              Frequency  Min 2X/week        Progress Toward Goals  OT Goals(current goals can now be found in the care plan section)  Progress towards OT goals: Progressing toward goals  Acute Rehab OT Goals Patient Stated Goal: "go back to work in 6 months." OT Goal Formulation: With patient Time For Goal Achievement: 07/17/18 Potential to Achieve Goals: Good  Plan Discharge plan remains appropriate    Co-evaluation                 AM-PAC PT "6 Clicks" Daily Activity     Outcome Measure   Help from another person eating meals?: None Help from another person taking care of personal grooming?: A Little Help from another person  toileting, which includes using toliet, bedpan, or urinal?: A Lot Help from another person bathing (including washing, rinsing, drying)?: A Little Help from another person to put on and taking off regular upper body clothing?: A Little Help from another person to put on and taking off regular lower body clothing?: A Little 6 Click Score: 18    End of Session Equipment Utilized During Treatment: Gait belt;Rolling walker  OT Visit Diagnosis: Other abnormalities of gait and mobility (R26.89);Muscle weakness (generalized) (M62.81);Pain   Activity Tolerance Patient tolerated treatment well   Patient Left in chair;with call bell/phone within reach   Nurse Communication          Time: 1224-4975 OT Time Calculation (min): 24 min  Charges: OT General Charges $OT Visit: 1 Visit OT Treatments $Self Care/Home Management : 23-37 mins  Nestor Lewandowsky, OTR/L Acute Rehabilitation Services Pager: [phone] Office: [phone]   Malka So 07/05/2018, 12:10 PM

## 2018-07-05 NOTE — Progress Notes (Signed)
CARDIAC REHAB PHASE I   PRE:  Rate/Rhythm: 76 SR  BP:  Supine: 112/75  Sitting:   Standing:    SaO2: 95%RA  MODE:  Ambulation: 250 ft   POST:  Rate/Rhythm: 96 SR  BP:  Supine: 157/88  Sitting:   Standing:    SaO2: 93-94%RA 1420-1450 Pt walked 250 ft on RA with rolling walker with little assistance. Tired easily. Stopped once to rest. Back to bed after walk. Stated she has walker at home to use. Encouraged third walk this evening.    Graylon Good, RN BSN  07/05/2018 2:43 PM

## 2018-07-05 NOTE — Progress Notes (Addendum)
Physical Therapy Treatment Patient Details Name: [name] MRN: [id number] DOB: 03-31-1966 Today's Date: 07/05/2018    History of Present Illness Pt is a 52 y.o. F with significant PMH of diabetes mellitus type 2, stroke, diastolic CHF, peripheral vascular disease, depression who presents to ER with chest pain and SOB and ruled in for NSTEMI. Now s/p coronary artery bypass grafting x 4.    PT Comments    Pt admitted with above diagnosis. Pt currently with functional limitations due to balance and endurance deficits. Pt was able to ambulate in halls with RW with min guard assist.  Progressing well.  Per pt, son and  Girlfriend will assist her at home.  Pt will benefit from skilled PT to increase their independence and safety with mobility to allow discharge to the venue listed below.     Follow Up Recommendations  Home health PT;Supervision/Assistance - 24 hour     Equipment Recommendations  Rolling walker with 5" wheels;3in1 (PT)    Recommendations for Other Services       Precautions / Restrictions Precautions Precautions: Fall;Sternal Precaution Booklet Issued: Yes (comment) Precaution Comments: pt able to generalize sternal precautions in mobility Restrictions Weight Bearing Restrictions: Yes(sternal precautions) Other Position/Activity Restrictions: sternal precautions    Mobility  Bed Mobility               General bed mobility comments: pt received in chair  Transfers Overall transfer level: Needs assistance Equipment used: None Transfers: Sit to/from Stand Sit to Stand: Min guard         General transfer comment: pt self cues to not use hands with sit<>stand  Ambulation/Gait Ambulation/Gait assistance: Min guard Gait Distance (Feet): 250 Feet Assistive device: Rolling walker (2 wheeled) Gait Pattern/deviations: Step-through pattern;Decreased stride length Gait velocity: decreased Gait velocity interpretation: <1.8 ft/sec, indicate of risk for  recurrent falls General Gait Details: Patient requiring min guard assist wtih steady gait. less fatigue today. Pt sats registered at 88% on RA however talked to Cardiac Rehab to recheck with different monitor in pm as unsure that this PT pulse ox was working properly.  Reviewed incentive spirometer with pt.  Overall much improved today.    Stairs             Wheelchair Mobility    Modified Rankin (Stroke Patients Only)       Balance Overall balance assessment: Needs assistance Sitting-balance support: No upper extremity supported;Feet supported Sitting balance-Leahy Scale: Good     Standing balance support: Bilateral upper extremity supported Standing balance-Leahy Scale: Fair Standing balance comment: can release walker in static standing to manage pants                            Cognition Arousal/Alertness: Awake/alert Behavior During Therapy: WFL for tasks assessed/performed Overall Cognitive Status: Within Functional Limits for tasks assessed Area of Impairment: Attention;Awareness                   Current Attention Level: Selective       Awareness: Emergent          Exercises      General Comments        Pertinent Vitals/Pain Pain Assessment: Faces Faces Pain Scale: Hurts little more Pain Location: incisional Pain Descriptors / Indicators: Grimacing;Guarding;Sore Pain Intervention(s): Monitored during session    Home Living  Prior Function            PT Goals (current goals can now be found in the care plan section) Acute Rehab PT Goals Patient Stated Goal: "go back to work in 6 months." Progress towards PT goals: Progressing toward goals    Frequency    Min 3X/week      PT Plan Discharge plan needs to be updated    Co-evaluation              AM-PAC PT "6 Clicks" Daily Activity  Outcome Measure  Difficulty turning over in bed (including adjusting bedclothes, sheets and  blankets)?: A Little Difficulty moving from lying on back to sitting on the side of the bed? : Unable Difficulty sitting down on and standing up from a chair with arms (e.g., wheelchair, bedside commode, etc,.)?: Unable Help needed moving to and from a bed to chair (including a wheelchair)?: A Little Help needed walking in hospital room?: A Little Help needed climbing 3-5 steps with a railing? : A Lot 6 Click Score: 13    End of Session Equipment Utilized During Treatment: Gait belt Activity Tolerance: Patient tolerated treatment well Patient left: in chair;with call bell/phone within reach Nurse Communication: Mobility status PT Visit Diagnosis: Unsteadiness on feet (R26.81);Difficulty in walking, not elsewhere classified (R26.2)     Time: 3154-0086 PT Time Calculation (min) (ACUTE ONLY): 20 min  Charges:  $Gait Training: 8-22 mins                     [place] Pager:  [phone]  Office:  [place] 07/05/2018, 1:37 PM

## 2018-07-06 ENCOUNTER — Other Ambulatory Visit: Payer: Self-pay | Admitting: Cardiology

## 2018-07-06 DIAGNOSIS — Z951 Presence of aortocoronary bypass graft: Secondary | ICD-10-CM

## 2018-07-06 LAB — CREATININE, SERUM
Creatinine, Ser: 1.35 mg/dL — ABNORMAL HIGH (ref 0.44–1.00)
GFR calc Af Amer: 51 mL/min — ABNORMAL LOW (ref >60)
GFR calc non Af Amer: 44 mL/min — ABNORMAL LOW (ref >60)

## 2018-07-06 LAB — GLUCOSE, CAPILLARY
Glucose-Capillary: 155 mg/dL — ABNORMAL HIGH (ref 70–99)
Glucose-Capillary: 250 mg/dL — ABNORMAL HIGH (ref 70–99)

## 2018-07-06 MED ORDER — AMLODIPINE BESYLATE 5 MG PO TABS: 5.0000 mg | ORAL_TABLET | Freq: Every day | ORAL | 1 refills | Status: DC

## 2018-07-06 MED ORDER — HYDROCODONE-ACETAMINOPHEN 10-325 MG PO TABS: ORAL_TABLET | ORAL | 0 refills | Status: DC

## 2018-07-06 MED ORDER — ACETAMINOPHEN 325 MG PO TABS: 650.0000 mg | ORAL_TABLET | Freq: Four times a day (QID) | ORAL | Status: DC | PRN

## 2018-07-06 MED ORDER — ATORVASTATIN CALCIUM 80 MG PO TABS: 80.0000 mg | ORAL_TABLET | Freq: Every day | ORAL | 1 refills | Status: DC

## 2018-07-06 MED ORDER — ISOSORBIDE MONONITRATE ER 30 MG PO TB24: 15.0000 mg | ORAL_TABLET | Freq: Every day | ORAL | 0 refills | Status: DC

## 2018-07-06 MED ORDER — ASPIRIN 325 MG PO TBEC: 325.0000 mg | DELAYED_RELEASE_TABLET | Freq: Every day | ORAL | 0 refills | Status: DC

## 2018-07-06 MED ORDER — METOPROLOL TARTRATE 25 MG PO TABS: 25.0000 mg | ORAL_TABLET | Freq: Two times a day (BID) | ORAL | 1 refills | Status: DC

## 2018-07-06 MED ORDER — OXYCODONE HCL 5 MG PO TABS: ORAL_TABLET | ORAL | 0 refills | Status: DC

## 2018-07-06 NOTE — Progress Notes (Signed)
Patient in a stable condition, discharge education reviewed with patient at bedside, she verbalized understanding.iv removed, tele dc ccmd notified, prescriptions given to patient,equipment for home use delieverd to patient's room, patient awaiting her son for transportation home

## 2018-07-06 NOTE — Care Management Note (Addendum)
Case Management Note Initial CM note completed by Bethena Roys, RN 06/28/2018, 10:25 AM   Patient Details  Name: [name] MRN: [id number] Date of Birth: 1965/12/27  Subjective/Objective:   Pt presented for Nstemi- Plan for CABG 06-28-18. PTA from home alone- per patient her son will be staying with her post hospitalization.                  Action/Plan: CM will continue to monitor for additional needs.   Expected Discharge Date:  07/06/18               Expected Discharge Plan:  [place]  In-House Referral:  NA  Discharge planning Services  CM Consult  Post Acute Care Choice:  Durable Medical Equipment, Home Health Choice offered to:  Patient  DME Arranged:  3-N-1, Walker rolling DME Agency:  [place]:  PT [facility] Agency:  [place]  Status of Service:  Completed, signed off  If discussed at [place] of Stay Meetings, dates discussed:    Discharge Disposition: home/home health   Additional Comments:  07/06/18- [place] RN, CM- pt for transition home today- orders placed for DME RW and 3n1 and HHPT. Spoke with pt at bedside- choice offered for DME and [facility] agency- per pt choice she would like to use [facility] for all transition needs- call made to [facility] with [facility] for DME needs- RW and 3n1 to be delivered to room prior to discharge this afternoon- referral for [facility] needs called to [place]- referral has been accepted for HHPT. Pt's son to come later this afternoon to transport pt home. Address, phone # and PCP confirmed with pt in epic  Dawayne Patricia, RN 07/06/2018, 10:26 AM [phone] 4E Transitions of Care RN Case Manager

## 2018-07-06 NOTE — Progress Notes (Addendum)
Progress Note  Patient Name: [name] Date of Encounter: 07/06/2018  Primary Cardiologist: Dr. Alveda Reasons; Previously Johnsie Cancel from 2016  Subjective   Patient feeling well today.  Some mild incisional pain however has just been ambulating in her room.  Plan for discharge today.  Inpatient Medications    Scheduled Meds: . amLODipine  5 mg Oral Daily  . aspirin EC  325 mg Oral Daily   Or  . aspirin  324 mg Per Tube Daily  . atorvastatin  80 mg Oral q1800  . bisacodyl  10 mg Oral Daily   Or  . bisacodyl  10 mg Rectal Daily  . docusate sodium  200 mg Oral Daily  . enoxaparin (LOVENOX) injection  40 mg Subcutaneous QHS  . escitalopram  10 mg Oral Daily  . furosemide  40 mg Oral Daily  . gabapentin  300 mg Oral TID  . insulin aspart  0-15 Units Subcutaneous TID WC  . insulin aspart  0-5 Units Subcutaneous QHS  . insulin aspart  3 Units Subcutaneous TID WC  . isosorbide mononitrate  15 mg Oral Daily  . levothyroxine  25 mcg Oral QAC breakfast  . metFORMIN  500 mg Oral BID WC  . metoprolol tartrate  25 mg Oral BID   Or  . metoprolol tartrate  12.5 mg Per Tube BID  . pantoprazole  40 mg Oral Daily  . potassium chloride SA  20 mEq Oral Daily  . sodium chloride flush  3 mL Intravenous Q12H   Continuous Infusions: . sodium chloride     PRN Meds: sodium chloride, acetaminophen, alum & mag hydroxide-simeth, azelastine, magnesium hydroxide, metoprolol tartrate, ondansetron (ZOFRAN) IV, oxyCODONE, sodium chloride flush   Vital Signs    Vitals:   07/05/18 1926 07/05/18 2231 07/06/18 0516 07/06/18 0905  BP: (!) 164/106 (!) 157/85 137/78 (!) 150/83  Pulse:    79  Resp: (!) 0  15 17  Temp:   98.1 F (36.7 C)   TempSrc:   Oral   SpO2:      Weight:   73 kg   Height:        Intake/Output Summary (Last 24 hours) at 07/06/2018 1137 Last data filed at 07/06/2018 0900 Gross per 24 hour  Intake 163 ml  Output 450 ml  Net -287 ml   Filed Weights   07/04/18 0556  07/05/18 0436 07/06/18 0516  Weight: 77.1 kg 75.4 kg 73 kg   Physical Exam   General: Well developed, well nourished, NAD Skin: Warm, dry, intact.  Midline chest incision CDI, left forearm incision CDI Head: Normocephalic, atraumatic, clear, moist mucus membranes. Neck: Negative for carotid bruits. No JVD Lungs:Clear to ausculation bilaterally. No wheezes, rales, or rhonchi. Breathing is unlabored. Cardiovascular: RRR with S1 S2. No murmurs, rubs, gallops, or LV heave appreciated. Abdomen: Soft, non-tender, non-distended with normoactive bowel sounds. No hepatomegaly, No rebound/guarding. No obvious abdominal masses. MSK: Strength and tone appear normal for age. 5/5 in all extremities Extremities: No edema. No clubbing or cyanosis. DP/PT pulses 2+ bilaterally Neuro: Alert and oriented. No focal deficits. No facial asymmetry. MAE spontaneously. Psych: Responds to questions appropriately with normal affect.    Labs    Chemistry Recent Labs  Lab 07/03/18 0449 07/04/18 0405 07/05/18 0756 07/06/18 0523  NA 138 137 136  --   K 4.5 4.4 4.6  --   CL 105 104 99  --   CO2 25 25 28   --   GLUCOSE 170*  154* 174*  --   BUN 37* 33* 22*  --   CREATININE 1.57* 1.44* 1.11* 1.35*  CALCIUM 7.7* 7.8* 8.4*  --   GFRNONAA 36* 40* 55* 44*  GFRAA 42* 47* >60 51*  ANIONGAP 8 8 9   --      Hematology Recent Labs  Lab 07/02/18 0340 07/03/18 0449 07/04/18 0405  WBC 11.1* 11.7* 9.4  RBC 3.00* 3.16* 3.32*  HGB 7.9* 8.3* 8.8*  HCT 26.5* 27.5* 28.3*  MCV 88.3 87.0 85.2  MCH 26.3 26.3 26.5  MCHC 29.8* 30.2 31.1  RDW 15.5 15.4 15.1  PLT 309 355 432*   Cardiac EnzymesNo results for input(s): TROPONINI in the last 168 hours. No results for input(s): TROPIPOC in the last 168 hours.   BNPNo results for input(s): BNP, PROBNP in the last 168 hours.   DDimer No results for input(s): DDIMER in the last 168 hours.   Radiology    No results found.  Telemetry    07/06/2018 NSR- Personally  Reviewed  ECG    No new tracing as of 07/06/18- Personally Reviewed  Cardiac Studies   Cath 06/24/18:  Ost RPDA to RPDA lesion is 40% stenosed.  RPDA-1 lesion is 50% stenosed.  RPDA-2 lesion is 80% stenosed.  Prox RCA lesion is 20% stenosed.  Mid RCA lesion is 20% stenosed.  Mid LM to Dist LM lesion is 80% stenosed.  Dist LM to Prox LAD lesion is 85% stenosed.  Prox LAD to Mid LAD lesion is 80% stenosed.  Ost 2nd Diag to 2nd Diag lesion is 95% stenosed.  Mid LAD lesion is 95% stenosed.  Dist LAD lesion is 99% stenosed.  Prox Cx to Mid Cx lesion is 60% stenosed.  LV end diastolic pressure is severely elevated.  There is moderate left ventricular systolic dysfunction.   Severe multivessel CAD with 80% distal left main stenosis, calcification of the proximal LAD with diffuse 85 to 90% stenosis before the first diagonal vessel, 80% stenoses between the first and second diagonal vessel with 95% ostial stenosis of the second diagonal and LAD beyond the diagonal vessel; the LAD is subtotally occluded in the distal LAD after the third diagonal vessel with faint filling antegrade; 60% proximal left circumflex stenosis with 50% distal circumflex marginal stenosis; large dominant RCA with 20% tandem mid RCA stenoses, and PDA stenosis of 40% ostially 50% in the mid segment and 80% distally.  Moderate LV dysfunction with an ejection fraction of 40 to 45% with hypocontractility involving the mid distal anterolateral wall extending to the apex.  LVEDP is elevated at 29 mm.  RECOMMENDATION: Surgical consultation for CABG revascularization surgery.  Increase anti-ischemic medical therapy. She will be started on high potency statin therapy.  With high-grade disease consider heparinization but must follow hemoglobin closely with recent decreased hemoglobin.  Recommend Aspirin 81mg  daily for CAD and with need for CABG revascularization will not start P2Y12 inhibition.  Echocardiogram  06/24/18: Study Conclusions  - Left ventricle: The cavity size was normal. Wall thickness was   increased in a pattern of mild LVH. Systolic function was low   normal to mildly reduced. The estimated ejection fraction was in   the range of 50% to 55%. Anterior and anterolateral hypokinesis.   Features are consistent with a pseudonormal left ventricular   filling pattern, with concomitant abnormal relaxation and   increased filling pressure (grade 2 diastolic dysfunction). - Aortic valve: There was no stenosis. - Mitral valve: There was no significant regurgitation. - Left atrium:  The atrium was mildly dilated. - Right ventricle: The cavity size was normal. Systolic function   was normal. - Pulmonary arteries: No complete TR doppler jet so unable to   estimate PA systolic pressure. - Systemic veins: IVC measured 1.8 cm with < 50% respirophasic   variation, suggesting RA pressure 8 mmHg.  Impressions:  - Normal LV size with mild LV hypertrophy. EF 50-55%, anterior and   anterolateral hypokinesis. Normal RV size and systolic function.   No significant valvular abnormalities.  Patient Profile     52 y.o. female with a history of chronic diastolic heart failure on Echo in 2017, hypertension, hyperlipidemia, type 2 diabetes, CVA in 2014 and 2017, and peripheral vascular disease who was initially evaluated by cardiology for chest pain, ultimately undergoing a cardiac cath on 06/24/18 and subsequent CABG on 06/28/18 per Dr. Roxan Hockey  Assessment & Plan    1. S/P CABG 06/28/18: -Pt followed by [facility] during most of hospital course. Underwent CABG 06/28/18 for severe multi-vessel CAD -Stable, denies chest pain>> mild incisional pain -Will have MD give final recommendations and follow in our office -Continue ASA, atorvastatin, IMDUR, metoprolol  2. Hypertension - Stable, 150/83, 137/78, 157/85 -Continue metoprolol 25 mg twice daily, consider uptitrating given BP elevation  -Continue  amlodipine 5 mg, IMDUR15 mg daily  3. Hyperlipidemia - LDL 139 on 06/24/2018 -Continue atorvastatin  4. Type 2 Diabetes Mellitus:  - Continue SSI while inpatient status   Signed, Kathyrn Drown NP-C [facility] Pager: [phone] 07/06/2018, 11:37 AM     For questions or updates, please contact   Please consult [email or web address] for contact info under Cardiology/STEMI.  ---------------------------------------------------------------------------------------------   History and all data above reviewed.  Patient examined.  I agree with the findings as above.  AKIMA SLAUGH is doing well, and anticipating discharge.  Constitutional: No acute distress Eyes: pupils equally round and reactive to light, sclera non-icteric, normal conjunctiva and lids ENMT: normal dentition, moist mucous membranes Cardiovascular: regular rhythm, normal rate, no murmurs. S1 and S2 normal. Radial pulses normal bilaterally. No jugular venous distention.  Respiratory: clear to auscultation bilaterally GI : normal bowel sounds, soft and nontender. No distention.   MSK: extremities warm, well perfused. No edema.  LYMPH: No lymphadenopathy noted of the head and neck NEURO: grossly nonfocal exam, moves all extremities. PSYCH: alert and oriented x 3, normal mood and affect.   All available labs, radiology testing, previous records reviewed. Agree with documented assessment and plan of my colleague as stated above with the following additions or changes:  Principal Problem:   Non-ST elevation (NSTEMI) myocardial infarction [facility]) Active Problems:   Essential hypertension, benign   Anemia of chronic disease   Chest pain   Type 2 diabetes mellitus with vascular disease (HCC)   Acute on chronic diastolic CHF (congestive heart failure) ([place])   Coronary artery disease involving native coronary artery of native heart with angina pectoris (HCC)   S/P CABG x 3    CARDIOLOGY RECOMMENDATIONS:  Discharge is  anticipated in the next 48 hours. Recommendations for medications and follow up:  Discharge Medications: Amlodipine 5 mg daily Metoprolol tartrate 25 mg bid Imdur 15 mg daily Lasix 40 mg daily with potassium 20 mEq daily Atorvastatin 80 mg daily  Continue medications as they are currently listed in the [facility]. Exceptions to the above:  ACE-I to be started as outpatient  Follow Up: Please schedule follow up with patient's primary care doctor in 1-2 weeks after hospital dismissal.  The patient's Primary Cardiologist  is Elouise Munroe, MD   Follow up in the office in 4 week(s).  Signed,  Elouise Munroe, MD  1:46 PM 07/06/2018  [facility]

## 2018-07-06 NOTE — Progress Notes (Addendum)
      [address]       ,[place] [address]             [phone]        8 Days Post-Op Procedure(s) (LRB): CORONARY ARTERY BYPASS GRAFTING (CABG) times  four, using left internal mammary artery, endoscopically harvested right saphenous vein, and harvested left radial artery (N/A) TRANSESOPHAGEAL ECHOCARDIOGRAM (TEE) (N/A) RADIAL ARTERY HARVEST (Left) ENDOVEIN HARVEST OF GREATER SAPHENOUS VEIN (Right)  Subjective: Patient sleeping and awakened. She has no specific complaints at this time.  Objective: Vital signs in last 24 hours: Temp:  [98.1 F (36.7 C)-98.4 F (36.9 C)] 98.1 F (36.7 C) (10/29 0516) Pulse Rate:  [81-84] 84 (10/28 1924) Cardiac Rhythm: Normal sinus rhythm (10/28 2200) Resp:  [0-18] 15 (10/29 0516) BP: (112-164)/(75-106) 137/78 (10/29 0516) SpO2:  [94 %] 94 % (10/28 1427) Weight:  [73 kg] 73 kg (10/29 0516)  Pre op weight 76.4 kg Current Weight  07/06/18 73 kg      Intake/Output from previous day: 10/28 0701 - 10/29 0700 In: 3 [I.V.:3] Out: 250 [Urine:250]   Physical Exam:  Cardiovascular: RRR Pulmonary: Clear to auscultation bilaterally Abdomen: Soft, non tender, bowel sounds present. Extremities: No lower extremity edema. Motor/sensory intact LUE. Wounds: Clean and dry.  No erythema or signs of infection.  Lab Results: CBC: Recent Labs    07/04/18 0405  WBC 9.4  HGB 8.8*  HCT 28.3*  PLT 432*   BMET:  Recent Labs    07/04/18 0405 07/05/18 0756 07/06/18 0523  NA 137 136  --   K 4.4 4.6  --   CL 104 99  --   CO2 25 28  --   GLUCOSE 154* 174*  --   BUN 33* 22*  --   CREATININE 1.44* 1.11* 1.35*  CALCIUM 7.8* 8.4*  --     PT/INR:  Lab Results  Component Value Date   INR 1.30 06/28/2018   INR 1.13 06/26/2018   INR 1.26 06/24/2018   ABG:  INR: Will add last result for INR, ABG once components are confirmed Will add last 4 CBG results once components are confirmed  Assessment/Plan:  1. CV - SR.   On Lopressor 25 mg bid, Amlodipine 5 mg daily, and  Imdur 15 mg daily. Will continue with above as creatinine this am 1.35. Will consider restarting Lisinopril after discharge. 2.  Pulmonary - On room air. Encourage incentive spirometer. 3. Volume Overload - On Lasix 40 mg daily.  4.  Acute blood loss anemia - H and H stable at 8.8 and 28.3 5. Creatinine this am 1.35 (slightly increased)   6. Hypothyroidism Levothyroxine 25 mcg daily 7. DM-CBGs 165/157/155. On Metformin 500 mg bid and Insulin.  Pre op HGA1C 11.1. She will need close follow up with medical doctor. 8. Hope to discharge later today  Sharalyn Ink ZimmermanPA-C 07/06/2018,7:11 AM [phone]  Patient seen and examined, agree with above Home today  Revonda Standard. Roxan Hockey, MD [facility] [phone]

## 2018-07-06 NOTE — Progress Notes (Signed)
Physical Therapy Treatment Patient Details Name: [name] MRN: [id number] DOB: 29-Mar-1966 Today's Date: 07/06/2018    History of Present Illness Pt is a 52 y.o. F with significant PMH of diabetes mellitus type 2, stroke, diastolic CHF, peripheral vascular disease, depression who presents to ER with chest pain and SOB and ruled in for NSTEMI. Now s/p coronary artery bypass grafting x 4.    PT Comments    Pt tolerated session well, but reporting soreness in chest throughout.  Able to mobilize with decreased assist and increase gait distance this session.  Discussed home safety and f/u with HHPT at d/c.  Pt agreeable to plan.     Follow Up Recommendations  Home health PT;Supervision/Assistance - 24 hour     Equipment Recommendations  Rolling walker with 5" wheels;3in1 (PT)    Recommendations for Other Services       Precautions / Restrictions Precautions Precautions: Fall;Sternal Restrictions Weight Bearing Restrictions: Yes Other Position/Activity Restrictions: sternal precautions    Mobility  Bed Mobility Overal bed mobility: Needs Assistance Bed Mobility: Supine to Sit;Sit to Supine     Supine to sit: Min assist Sit to supine: Supervision   General bed mobility comments: min assist to initiate trunk elevation when coming to sit 2/2 pain  Transfers Overall transfer level: Needs assistance Equipment used: Rolling walker (2 wheeled) Transfers: Sit to/from Stand Sit to Stand: Supervision            Ambulation/Gait Ambulation/Gait assistance: Supervision Gait Distance (Feet): 300 Feet Assistive device: Rolling walker (2 wheeled) Gait Pattern/deviations: WFL(Within Functional Limits)         Stairs             Wheelchair Mobility    Modified Rankin (Stroke Patients Only)       Balance   Sitting-balance support: No upper extremity supported;Feet supported Sitting balance-Leahy Scale: Good     Standing balance support: Bilateral  upper extremity supported;No upper extremity supported Standing balance-Leahy Scale: Good                              Cognition Arousal/Alertness: Awake/alert Behavior During Therapy: WFL for tasks assessed/performed Overall Cognitive Status: Within Functional Limits for tasks assessed                                        Exercises      General Comments        Pertinent Vitals/Pain Pain Assessment: 0-10 Pain Score: 4  Pain Descriptors / Indicators: Guarding;Sore Pain Intervention(s): Limited activity within patient's tolerance;Monitored during session    Home Living                      Prior Function            PT Goals (current goals can now be found in the care plan section) Acute Rehab PT Goals Patient Stated Goal: none stated this session PT Goal Formulation: With patient Time For Goal Achievement: 07/16/18 Potential to Achieve Goals: Good Progress towards PT goals: Progressing toward goals    Frequency    Min 3X/week      PT Plan Current plan remains appropriate    Co-evaluation              AM-PAC PT "6 Clicks" Daily Activity  Outcome Measure  Difficulty turning over  in bed (including adjusting bedclothes, sheets and blankets)?: A Little Difficulty moving from lying on back to sitting on the side of the bed? : A Little Difficulty sitting down on and standing up from a chair with arms (e.g., wheelchair, bedside commode, etc,.)?: A Little Help needed moving to and from a bed to chair (including a wheelchair)?: A Little Help needed walking in hospital room?: A Little Help needed climbing 3-5 steps with a railing? : A Little 6 Click Score: 18    End of Session   Activity Tolerance: Patient tolerated treatment well Patient left: in bed;with call bell/phone within reach Nurse Communication: Mobility status PT Visit Diagnosis: Unsteadiness on feet (R26.81);Difficulty in walking, not elsewhere classified  (R26.2)     Time: 0626-9485 PT Time Calculation (min) (ACUTE ONLY): 10 min  Charges:  $Therapeutic Exercise: 8-22 mins                        Michel Santee 07/06/2018, 10:20 AM

## 2018-07-06 NOTE — Progress Notes (Signed)
CARDIAC REHAB PHASE I    Pt seen ambulating hallways with PT. Pt seems anxious about going home, worried that her "heart will stop". Reassurance given to pt. Reinforec d/c ed with pt. Pt educated to shower daily and monitor incisions. Reviewed restrictions and exercise guidelines. Encouraged continued IS use. Pt referred to [facility] [place]. For d/c today.  [id number] Rufina Falco, RN BSN 07/06/2018 11:02 AM

## 2018-07-06 NOTE — Progress Notes (Signed)
Chest tube sutures removed, per order. Sites clean and dry. Benzoin and steri-strips applied.    Ara Kussmaul BSN, RN

## 2018-07-07 ENCOUNTER — Telehealth: Payer: Self-pay

## 2018-07-07 NOTE — Telephone Encounter (Signed)
Benjamine Mola, PT with [facility] [phone] contacted the office to state she was in the home yesterday 07/06/2018 for a start of care visit, patient is s/p CABG x4 06/28/18 and was discharged 07/06/2018.  She was going to start care today 07/08/2018, however, the patient requested she start on Thursday 07/08/2018 due to family visiting.

## 2018-07-08 ENCOUNTER — Telehealth: Payer: Self-pay | Admitting: Family Medicine

## 2018-07-08 DIAGNOSIS — Z48812 Encounter for surgical aftercare following surgery on the circulatory system: Secondary | ICD-10-CM | POA: Diagnosis not present

## 2018-07-08 DIAGNOSIS — I2510 Atherosclerotic heart disease of native coronary artery without angina pectoris: Secondary | ICD-10-CM | POA: Diagnosis not present

## 2018-07-08 DIAGNOSIS — E1142 Type 2 diabetes mellitus with diabetic polyneuropathy: Secondary | ICD-10-CM | POA: Diagnosis not present

## 2018-07-08 DIAGNOSIS — I951 Orthostatic hypotension: Secondary | ICD-10-CM | POA: Diagnosis not present

## 2018-07-08 DIAGNOSIS — I110 Hypertensive heart disease with heart failure: Secondary | ICD-10-CM | POA: Diagnosis not present

## 2018-07-08 DIAGNOSIS — I5032 Chronic diastolic (congestive) heart failure: Secondary | ICD-10-CM | POA: Diagnosis not present

## 2018-07-08 DIAGNOSIS — I214 Non-ST elevation (NSTEMI) myocardial infarction: Secondary | ICD-10-CM | POA: Diagnosis not present

## 2018-07-08 DIAGNOSIS — E11319 Type 2 diabetes mellitus with unspecified diabetic retinopathy without macular edema: Secondary | ICD-10-CM | POA: Diagnosis not present

## 2018-07-08 DIAGNOSIS — G2581 Restless legs syndrome: Secondary | ICD-10-CM | POA: Diagnosis not present

## 2018-07-08 DIAGNOSIS — F329 Major depressive disorder, single episode, unspecified: Secondary | ICD-10-CM | POA: Diagnosis not present

## 2018-07-08 DIAGNOSIS — M48062 Spinal stenosis, lumbar region with neurogenic claudication: Secondary | ICD-10-CM | POA: Diagnosis not present

## 2018-07-08 DIAGNOSIS — M4726 Other spondylosis with radiculopathy, lumbar region: Secondary | ICD-10-CM | POA: Diagnosis not present

## 2018-07-08 DIAGNOSIS — E1151 Type 2 diabetes mellitus with diabetic peripheral angiopathy without gangrene: Secondary | ICD-10-CM | POA: Diagnosis not present

## 2018-07-08 NOTE — Telephone Encounter (Unsigned)
Copied from [place] [id number]. Topic: Quick Communication - Home Health Verbal Orders >> Jul 08, 2018  3:24 PM Yvette Rack wrote: Caller/Agency: Rande Lawman / [facility] [place] Number: [phone] Requesting OT/PT/Skilled Nursing/Social Work: skilled nursing  Frequency: evaluation

## 2018-07-09 ENCOUNTER — Other Ambulatory Visit: Payer: Self-pay | Admitting: Family Medicine

## 2018-07-09 ENCOUNTER — Telehealth: Payer: Self-pay

## 2018-07-09 ENCOUNTER — Telehealth (HOSPITAL_COMMUNITY): Payer: Self-pay

## 2018-07-09 DIAGNOSIS — E11319 Type 2 diabetes mellitus with unspecified diabetic retinopathy without macular edema: Secondary | ICD-10-CM | POA: Diagnosis not present

## 2018-07-09 DIAGNOSIS — E1159 Type 2 diabetes mellitus with other circulatory complications: Secondary | ICD-10-CM

## 2018-07-09 DIAGNOSIS — F329 Major depressive disorder, single episode, unspecified: Secondary | ICD-10-CM

## 2018-07-09 DIAGNOSIS — E1142 Type 2 diabetes mellitus with diabetic polyneuropathy: Secondary | ICD-10-CM | POA: Diagnosis not present

## 2018-07-09 DIAGNOSIS — E0842 Diabetes mellitus due to underlying condition with diabetic polyneuropathy: Secondary | ICD-10-CM

## 2018-07-09 DIAGNOSIS — I5032 Chronic diastolic (congestive) heart failure: Secondary | ICD-10-CM | POA: Diagnosis not present

## 2018-07-09 DIAGNOSIS — I951 Orthostatic hypotension: Secondary | ICD-10-CM | POA: Diagnosis not present

## 2018-07-09 DIAGNOSIS — G2581 Restless legs syndrome: Secondary | ICD-10-CM | POA: Diagnosis not present

## 2018-07-09 DIAGNOSIS — M48062 Spinal stenosis, lumbar region with neurogenic claudication: Secondary | ICD-10-CM | POA: Diagnosis not present

## 2018-07-09 DIAGNOSIS — I214 Non-ST elevation (NSTEMI) myocardial infarction: Secondary | ICD-10-CM | POA: Diagnosis not present

## 2018-07-09 DIAGNOSIS — I2510 Atherosclerotic heart disease of native coronary artery without angina pectoris: Secondary | ICD-10-CM | POA: Diagnosis not present

## 2018-07-09 DIAGNOSIS — E1151 Type 2 diabetes mellitus with diabetic peripheral angiopathy without gangrene: Secondary | ICD-10-CM | POA: Diagnosis not present

## 2018-07-09 DIAGNOSIS — Z48812 Encounter for surgical aftercare following surgery on the circulatory system: Secondary | ICD-10-CM | POA: Diagnosis not present

## 2018-07-09 DIAGNOSIS — M4726 Other spondylosis with radiculopathy, lumbar region: Secondary | ICD-10-CM | POA: Diagnosis not present

## 2018-07-09 DIAGNOSIS — F32A Depression, unspecified: Secondary | ICD-10-CM

## 2018-07-09 DIAGNOSIS — I110 Hypertensive heart disease with heart failure: Secondary | ICD-10-CM | POA: Diagnosis not present

## 2018-07-09 MED ORDER — LEVOTHYROXINE SODIUM 25 MCG PO TABS: 25.0000 ug | ORAL_TABLET | Freq: Every day | ORAL | 0 refills | Status: DC

## 2018-07-09 NOTE — Telephone Encounter (Signed)
Benjamine Mola, PT with [facility] contacted the office today to state [name] who is post op CABG x4 with Dr. Roxan Hockey she has not been taking her medications.  Her blood pressure this yesterday morning 07/08/18 was 176/90.  She wanted to get verbal orders to add a nurse evaluation as well as 5 visits from physical therapy.  Verbal orders given, receipt acknowledged.  She stated she was going by her home today to make sure she was taking her medications until nursing could see her.

## 2018-07-09 NOTE — Telephone Encounter (Signed)
Patient called and advised of the need for an appointment with Dr. Charlett Blake, patient verbalized understanding. Appointment scheduled for Tuesday, 07/20/18 at 1045.

## 2018-07-09 NOTE — Telephone Encounter (Signed)
Courtesy refill of Levothyroxine until appointment.

## 2018-07-09 NOTE — Telephone Encounter (Signed)
Pt. Requesting refills of metformin, lexapro, and gabapentin with refills last sent over span from 2017-2018; LOV with PCP 11/2017. Orders pended, routed to Dr. Charlett Blake to review.

## 2018-07-09 NOTE — Telephone Encounter (Signed)
Copied from [place] [id number]. Topic: General - Other >> Jul 09, 2018  2:06 PM Lennox Solders wrote: Reason for CRM: [facility] is calling the patient needs med refills . Pt just got out of hospital and needs lexapro, gabapentin, synthroid and metformin.  Pt is out of these medications. Pt alc  is over 11  [facility]

## 2018-07-09 NOTE — Telephone Encounter (Signed)
Made initial call to patient in regards to CR - Pt stated she is interested in the program. Explained scheduling process and went over insurance, pt verbalized understanding. Will contact pt once f/u appt has been completed.

## 2018-07-09 NOTE — Telephone Encounter (Signed)
Requested medication (s) are due for refill today: Yes  Requested medication (s) are on the active medication list: Yes  Last refill:  Lexaprol 11/10/16; Gabapentin 03/02/17; Metformin 04/01/16  Future visit scheduled: Yes  Notes to clinic:  Expired Rx    Requested Prescriptions  Pending Prescriptions Disp Refills   escitalopram (LEXAPRO) 10 MG tablet 30 tablet 2    Sig: Take 1 tablet (10 mg total) by mouth daily.     Psychiatry:  Antidepressants - SSRI Failed - 07/09/2018  4:24 PM      Failed - Completed PHQ-2 or PHQ-9 in the last 360 days.      Passed - Valid encounter within last 6 months    Recent Outpatient Visits          4 weeks ago Avulsion of skin of toe, subsequent encounter   [facility] [place], [place], [place]   1 month ago Avulsion of skin of toe, initial encounter   [facility] [place], Luanna Cole, [place]   4 months ago Cough   [facility] [place], [place]   7 months ago Other polyneuropathy   [facility] [place], [place]   9 months ago High risk medication use   [facility] [place], [place]      Future Appointments            In 1 week Mosie Lukes, MD [place] [facility], [facility]          gabapentin (NEURONTIN) 100 MG capsule 90 capsule 3    Sig: Take 3 capsules (300 mg total) by mouth 3 (three) times daily.     Neurology: Anticonvulsants - gabapentin Passed - 07/09/2018  4:24 PM      Passed - Valid encounter within last 12 months    Recent Outpatient Visits          4 weeks ago Avulsion of skin of toe, subsequent encounter   [facility] [place], [place], [place]   1 month ago Avulsion of skin of toe, initial encounter   [facility] [place],  Luanna Cole, [place]   4 months ago Cough   [facility] [place], [place]   7 months ago Other polyneuropathy   [facility] [place], [place]   9 months ago High risk medication use   [facility] [place], [place]      Future Appointments            In 1 week Mosie Lukes, MD [place] at [facility], [facility]          metFORMIN (GLUCOPHAGE) 1000 MG tablet 75 tablet 1     Endocrinology:  Diabetes - Biguanides Failed - 07/09/2018  4:24 PM      Failed - Cr in normal range and within 360 days    Creat  Date Value Ref Range Status  02/09/2014 0.56 0.50 - 1.10 mg/dL Final   Creatinine, Ser  Date Value Ref Range Status  07/06/2018 1.35 (H) 0.44 - 1.00 mg/dL Final         Failed - HBA1C is between 0 and 7.9 and within 180 days  Hgb A1c MFr Bld  Date Value Ref Range Status  06/26/2018 11.1 (H) 4.8 - 5.6 % Final    Comment:    (NOTE) Pre diabetes:          5.7%-6.4% Diabetes:              >6.4% Glycemic control for   <7.0% adults with diabetes          Failed - eGFR in normal range and within 360 days    GFR calc Af Amer  Date Value Ref Range Status  07/06/2018 51 (L) >60 mL/min Final    Comment:    (NOTE) The eGFR has been calculated using the CKD EPI equation. This calculation has not been validated in all clinical situations. eGFR's persistently <60 mL/min signify possible Chronic Kidney Disease. Performed at [facility], [place] [address], [place], [place] [address]    GFR calc non Af Wyvonnia Lora  Date Value Ref Range Status  07/06/2018 44 (L) >60 mL/min Final   GFR  Date Value Ref Range Status  11/17/2017 77.24 >60.00 mL/min Final         Passed - Valid encounter within last 6 months    Recent Outpatient Visits          4 weeks ago Avulsion of skin of toe, subsequent encounter   [facility]  [place], [place], [place]   1 month ago Avulsion of skin of toe, initial encounter   [facility] [place], Luanna Cole, [place]   4 months ago Cough   [facility] [place], [place]   7 months ago Other polyneuropathy   [facility] [place], MD   9 months ago High risk medication use   [facility] [place], MD      Future Appointments            In 1 week Mosie Lukes, MD [place] [facility], [place]         Signed Prescriptions Disp Refills   levothyroxine (SYNTHROID, LEVOTHROID) 25 MCG tablet 15 tablet 0    Sig: Take 1 tablet (25 mcg total) by mouth daily before breakfast. Appointment needed for more refills.     Endocrinology:  Hypothyroid Agents Failed - 07/09/2018  4:24 PM      Failed - TSH needs to be rechecked within 3 months after an abnormal result. Refill until TSH is due.      Passed - TSH in normal range and within 360 days    TSH  Date Value Ref Range Status  07/02/2018 3.396 0.350 - 4.500 uIU/mL Final    Comment:    Performed by a 3rd Generation assay with a functional sensitivity of <=0.01 uIU/mL. Performed at [facility], [place] [address] [place], [place] [address]   11/17/2017 4.99 (H) 0.35 - 4.50 uIU/mL Final         Passed - Valid encounter within last 12 months    Recent Outpatient Visits          4 weeks ago Avulsion of skin of toe, subsequent encounter   [facility] [place], [place]   1 month ago Avulsion of skin of toe, initial encounter   [facility] [place], [place]   4 months ago Cough  [facility] [place], [place]   7 months ago Other polyneuropathy   [facility]  [place], MD   9 months ago High risk medication use   [facility] [place], MD      Future Appointments            In 1 week Mosie Lukes, MD [place] at [place]

## 2018-07-09 NOTE — Telephone Encounter (Signed)
Spoke with nurse and gave verbal for orders.   Nurse also wants PCP to know that while patients husband was in transition she took none of her medications. Patients husband passed away a month ago and patient has since had a heart attack. Nurse states patient now has CABG x4. She recently has been placed on new medications for her heart and also cholesterol. Nurse states patient has not taking and may need refills for her Lexapro 10mg , Gabapentin 300mg , nurse states patients was taking 300mg  TID, however currently not in pain and has not taking it for months. Also synthroid 25mg  daily, and metformin 1,000 twice a day and 500mg  once a day.  Nurse states patients A1c is at an 11 currently and she is all out of wack with her medications. Presently on new meds for heart and cholesterol since being released from the hospital   If patient can have these medications before she sees you let me know and I will send them in. If not we will have to wait until the patient calls in.  Called patients number provided unable to leave voicemail due to mailbox being full. Looked in patients chart and found significant other number listed, that number is d/c. I will send a letter to the patient in an effort to reach out to her to schedule an appointment with you.

## 2018-07-09 NOTE — Telephone Encounter (Signed)
Pt's insurance is active and benefits verified through [employer].  No Co-pay, deductible amount of $1,750/$1,750, out of pocket amount $3,000/$2,606, 20% co-insurance, and No pre-authorization is required. Passport/reference # - [id number]  Will make initial call to pt in regards to Cardiac Rehab. If interested, pt will need to complete follow up appt. Once completed, pt will be contacted for scheduling.

## 2018-07-10 DIAGNOSIS — I951 Orthostatic hypotension: Secondary | ICD-10-CM | POA: Diagnosis not present

## 2018-07-10 DIAGNOSIS — E1151 Type 2 diabetes mellitus with diabetic peripheral angiopathy without gangrene: Secondary | ICD-10-CM | POA: Diagnosis not present

## 2018-07-10 DIAGNOSIS — Z48812 Encounter for surgical aftercare following surgery on the circulatory system: Secondary | ICD-10-CM | POA: Diagnosis not present

## 2018-07-10 DIAGNOSIS — G2581 Restless legs syndrome: Secondary | ICD-10-CM | POA: Diagnosis not present

## 2018-07-10 DIAGNOSIS — M4726 Other spondylosis with radiculopathy, lumbar region: Secondary | ICD-10-CM | POA: Diagnosis not present

## 2018-07-10 DIAGNOSIS — E11319 Type 2 diabetes mellitus with unspecified diabetic retinopathy without macular edema: Secondary | ICD-10-CM | POA: Diagnosis not present

## 2018-07-10 DIAGNOSIS — I214 Non-ST elevation (NSTEMI) myocardial infarction: Secondary | ICD-10-CM | POA: Diagnosis not present

## 2018-07-10 DIAGNOSIS — E1142 Type 2 diabetes mellitus with diabetic polyneuropathy: Secondary | ICD-10-CM | POA: Diagnosis not present

## 2018-07-10 DIAGNOSIS — M48062 Spinal stenosis, lumbar region with neurogenic claudication: Secondary | ICD-10-CM | POA: Diagnosis not present

## 2018-07-10 DIAGNOSIS — I110 Hypertensive heart disease with heart failure: Secondary | ICD-10-CM | POA: Diagnosis not present

## 2018-07-10 DIAGNOSIS — I2510 Atherosclerotic heart disease of native coronary artery without angina pectoris: Secondary | ICD-10-CM | POA: Diagnosis not present

## 2018-07-10 DIAGNOSIS — I5032 Chronic diastolic (congestive) heart failure: Secondary | ICD-10-CM | POA: Diagnosis not present

## 2018-07-10 DIAGNOSIS — F329 Major depressive disorder, single episode, unspecified: Secondary | ICD-10-CM | POA: Diagnosis not present

## 2018-07-11 MED ORDER — GABAPENTIN 100 MG PO CAPS: 300.0000 mg | ORAL_CAPSULE | Freq: Three times a day (TID) | ORAL | 0 refills | Status: DC

## 2018-07-11 MED ORDER — ESCITALOPRAM OXALATE 10 MG PO TABS: 10.0000 mg | ORAL_TABLET | Freq: Every day | ORAL | 0 refills | Status: DC

## 2018-07-11 MED ORDER — METFORMIN HCL 1000 MG PO TABS: 500.0000 mg | ORAL_TABLET | ORAL | 0 refills | Status: DC

## 2018-07-11 NOTE — Telephone Encounter (Signed)
She can have refills til seen

## 2018-07-12 ENCOUNTER — Telehealth: Payer: Self-pay

## 2018-07-12 DIAGNOSIS — M48062 Spinal stenosis, lumbar region with neurogenic claudication: Secondary | ICD-10-CM | POA: Diagnosis not present

## 2018-07-12 DIAGNOSIS — G2581 Restless legs syndrome: Secondary | ICD-10-CM | POA: Diagnosis not present

## 2018-07-12 DIAGNOSIS — F329 Major depressive disorder, single episode, unspecified: Secondary | ICD-10-CM | POA: Diagnosis not present

## 2018-07-12 DIAGNOSIS — M4726 Other spondylosis with radiculopathy, lumbar region: Secondary | ICD-10-CM | POA: Diagnosis not present

## 2018-07-12 DIAGNOSIS — E1151 Type 2 diabetes mellitus with diabetic peripheral angiopathy without gangrene: Secondary | ICD-10-CM | POA: Diagnosis not present

## 2018-07-12 DIAGNOSIS — E1142 Type 2 diabetes mellitus with diabetic polyneuropathy: Secondary | ICD-10-CM | POA: Diagnosis not present

## 2018-07-12 DIAGNOSIS — E11319 Type 2 diabetes mellitus with unspecified diabetic retinopathy without macular edema: Secondary | ICD-10-CM | POA: Diagnosis not present

## 2018-07-12 DIAGNOSIS — I951 Orthostatic hypotension: Secondary | ICD-10-CM | POA: Diagnosis not present

## 2018-07-12 DIAGNOSIS — I2510 Atherosclerotic heart disease of native coronary artery without angina pectoris: Secondary | ICD-10-CM | POA: Diagnosis not present

## 2018-07-12 DIAGNOSIS — I5032 Chronic diastolic (congestive) heart failure: Secondary | ICD-10-CM | POA: Diagnosis not present

## 2018-07-12 DIAGNOSIS — I110 Hypertensive heart disease with heart failure: Secondary | ICD-10-CM | POA: Diagnosis not present

## 2018-07-12 DIAGNOSIS — Z48812 Encounter for surgical aftercare following surgery on the circulatory system: Secondary | ICD-10-CM | POA: Diagnosis not present

## 2018-07-12 DIAGNOSIS — I214 Non-ST elevation (NSTEMI) myocardial infarction: Secondary | ICD-10-CM | POA: Diagnosis not present

## 2018-07-12 NOTE — Telephone Encounter (Signed)
All meds refilled 07-11-18

## 2018-07-12 NOTE — Telephone Encounter (Signed)
Larene Beach, nurse with [facility] contacted the office [phone] requesting verbal orders for nursing home health for 2 times a week for 2 weeks, and 1 time a week for 3 weeks for CHF/ DM education and wound monitoring.  Left voicemail giving verbal orders and will await a return call if warranted.

## 2018-07-13 ENCOUNTER — Ambulatory Visit (INDEPENDENT_AMBULATORY_CARE_PROVIDER_SITE_OTHER): Payer: BLUE CROSS/BLUE SHIELD | Admitting: Physician Assistant

## 2018-07-13 ENCOUNTER — Other Ambulatory Visit: Payer: Self-pay | Admitting: Thoracic Surgery (Cardiothoracic Vascular Surgery)

## 2018-07-13 ENCOUNTER — Ambulatory Visit
Admission: RE | Admit: 2018-07-13 | Discharge: 2018-07-13 | Disposition: A | Discharge status: Home / Self Care | Payer: BLUE CROSS/BLUE SHIELD | Source: Ambulatory Visit | Attending: Thoracic Surgery (Cardiothoracic Vascular Surgery) | Admitting: Thoracic Surgery (Cardiothoracic Vascular Surgery)

## 2018-07-13 VITALS — BP 149/87 | HR 100 | Resp 20 | Ht 62.0 in | Wt 158.0 lb

## 2018-07-13 DIAGNOSIS — J90 Pleural effusion, not elsewhere classified: Secondary | ICD-10-CM | POA: Diagnosis not present

## 2018-07-13 DIAGNOSIS — Z951 Presence of aortocoronary bypass graft: Secondary | ICD-10-CM

## 2018-07-13 DIAGNOSIS — I2510 Atherosclerotic heart disease of native coronary artery without angina pectoris: Secondary | ICD-10-CM

## 2018-07-13 DIAGNOSIS — Z736 Limitation of activities due to disability: Secondary | ICD-10-CM

## 2018-07-13 MED ORDER — CEPHALEXIN 500 MG PO CAPS: 500.0000 mg | ORAL_CAPSULE | Freq: Two times a day (BID) | ORAL | 0 refills | Status: AC

## 2018-07-13 MED ORDER — POTASSIUM CHLORIDE ER 20 MEQ PO TBCR: 20.0000 meq | EXTENDED_RELEASE_TABLET | Freq: Every day | ORAL | 0 refills | Status: DC

## 2018-07-13 MED ORDER — CEPHALEXIN 500 MG PO CAPS: 500.0000 mg | ORAL_CAPSULE | Freq: Two times a day (BID) | ORAL | 0 refills | Status: DC

## 2018-07-13 MED ORDER — FUROSEMIDE 40 MG PO TABS: 40.0000 mg | ORAL_TABLET | Freq: Every day | ORAL | 0 refills | Status: DC

## 2018-07-13 MED ORDER — METFORMIN HCL 1000 MG PO TABS: ORAL_TABLET | ORAL | 0 refills | Status: DC

## 2018-07-13 NOTE — Patient Instructions (Signed)
She is return to our office on Monday, 07/19/2018 for follow-up regarding her left radial artery harvest site and her peripheral edema.

## 2018-07-13 NOTE — Progress Notes (Unsigned)
cxr 

## 2018-07-13 NOTE — Addendum Note (Signed)
Addended by: Magdalene Molly A on: 07/13/2018 09:16 AM   Modules accepted: Orders

## 2018-07-13 NOTE — Progress Notes (Signed)
[address]       [place], [address]             [phone]      [name] is a 52 y.o. female patient s/p CABG x 4 with left radial artery harvest with Dr. Roxan Hockey. She is a little over 2 weeks out from surgery.  She was discharged from the hospital on 07/06/2018.  Since then she has called our office with multiple complaints. She is here today for [name] to check her radial artery harvest site and peripheral edema.   No diagnosis found. Past Medical History:  Diagnosis Date  . Acute on chronic diastolic CHF (congestive heart failure) ([facility]) 06/24/2018  . Anxiety   . COMMON MIGRAINE 10/07/2010  . Decreased visual acuity 11/10/2016  . Depression 12/18/2012  . Diabetes mellitus type II, uncontrolled ([place]) 10/07/2010   Qualifier: Diagnosis of  By: Charlett Blake MD, [name]    . Diabetic foot infection ([facility]) 08/26/2016  . Disturbance of skin sensation 10/07/2010  . Heart murmur   . History of kidney stones    "years ago"  . Hyperlipidemia 12/06/2010  . Hypertension   . Lipoma of abdominal wall 10/05/2016  . Overweight(278.02) 12/06/2010  . PERIPHERAL NEUROPATHY, FEET 10/07/2010  . PVD (peripheral vascular disease) ([place]) 01/21/2012  . RESTLESS LEG SYNDROME 10/25/2010  . Stroke [facility]) 2014, 2017   most recently in 2/17 - intracerebral hemorrhage   No past surgical history pertinent negatives on file. Scheduled Meds: Current Outpatient Medications on File Prior to Visit  Medication Sig Dispense Refill  . acetaminophen (TYLENOL) 325 MG tablet Take 2 tablets (650 mg total) by mouth every 6 (six) hours as needed for mild pain or headache.    Marland Kitchen amLODipine (NORVASC) 5 MG tablet Take 1 tablet (5 mg total) by mouth daily. 30 tablet 1  . aspirin EC 325 MG EC tablet Take 1 tablet (325 mg total) by mouth daily. 30 tablet 0  . atorvastatin (LIPITOR) 80 MG tablet Take 1 tablet (80 mg total) by mouth daily at 6 PM. 30 tablet 1  . azelastine (ASTELIN) 0.1 % nasal spray Place 2 sprays  into both nostrils 2 (two) times daily. Use in each nostril as directed (Patient taking differently: Place 2 sprays into both nostrils 2 (two) times daily as needed for allergies. ) 30 mL 3  . escitalopram (LEXAPRO) 10 MG tablet Take 1 tablet (10 mg total) by mouth daily. 30 tablet 0  . fluticasone (FLONASE) 50 MCG/ACT nasal spray Place 2 sprays into both nostrils daily. (Patient taking differently: Place 2 sprays into both nostrils daily as needed for allergies. ) 16 g 1  . gabapentin (NEURONTIN) 100 MG capsule Take 3 capsules (300 mg total) by mouth 3 (three) times daily. 90 capsule 0  . HYDROcodone-acetaminophen (NORCO) 10-325 MG tablet Take one tablet every 4-6 hours PRN severe pain 20 tablet 0  . isosorbide mononitrate (IMDUR) 30 MG 24 hr tablet Take 0.5 tablets (15 mg total) by mouth daily. For one month then stop. 30 tablet 0  . LANTUS SOLOSTAR 100 UNIT/ML Solostar Pen 100 units sq in am and 50 units sq in pm (Patient taking differently: Inject 50-100 Units into the skin See admin instructions. Use 100 units every morning and use 50 units every evening) 45 mL 4  . levothyroxine (SYNTHROID, LEVOTHROID) 25 MCG tablet Take 1 tablet (25 mcg total) by mouth daily before breakfast. Appointment needed for more refills. 15 tablet  0  . metFORMIN (GLUCOPHAGE) 1000 MG tablet Take 1 tablet every morning then take 1/2 tablet at lunch and at bedtime 60 tablet 0  . metoprolol tartrate (LOPRESSOR) 25 MG tablet Take 1 tablet (25 mg total) by mouth 2 (two) times daily. 60 tablet 1  . ONE TOUCH ULTRA TEST test strip USE ONCE DAILY TO CHECK BLOOD SUGAR. DX E11.9  2  . ONE TOUCH ULTRA TEST test strip USE ONCE DAILY TO CHECK BLOOD SUGAR. DX E11.9 100 each 1  . silver sulfADIAZINE (SILVADENE) 1 % cream Apply 1 application topically daily. (Patient not taking: Reported on 06/23/2018) 50 g 0  . tizanidine (ZANAFLEX) 2 MG capsule Take 1 capsule (2 mg total) by mouth 2 (two) times daily as needed for muscle spasms. 30  capsule 0   No current facility-administered medications on file prior to visit.     Allergies  Allergen Reactions  . Lyrica [Pregabalin] Other (See Comments)    MADE PATIENT VERY EMOTIONAL AND WOULD CRY EASILY  . Morphine And Related Nausea And Vomiting  . Tramadol Nausea And Vomiting    Pt cant tolerate this pain med.     Last menstrual period 02/12/2011.  Subjective She returns to the office today for a radial artery harvest wound check. She has all her medications and has been taking them per the patient. Today, she has some lower extremity edema. She was on lasix in the hospital but is not on any diuretics at the moment.   Objective   Cor: RRR, no murmur Pulm: CTA bilaterally and in all fields Abd: no masses Wound: Radial artery harvest site (L) is bright red along the skin edges with areas of necrosis. The hand does not appear to be ischemic. Edema has improved and staples have been removed. Sternal incision is c/d/i Ext: 1-2+ pitting edema in her lower extremity     CLINICAL DATA:  Status post CABG on June 28, 2018 now with peripheral edema and exertional shortness of breath.  EXAM: CHEST - 2 VIEW  COMPARISON:  PA and lateral chest x-ray of July 04, 2018  FINDINGS: The lungs are well-expanded and clear. The heart is top-normal in size. The pulmonary vascularity is normal. There is a small left pleural effusion layering posteriorly. The sternal wires are intact. The bony thorax exhibits no acute abnormality.  IMPRESSION: Small left pleural effusion. No pulmonary edema. Stable top-normal cardiac silhouette size.   Electronically Signed   By: David  Martinique M.D.   On: 07/13/2018 13:07   Assessment & Plan   Radial artery harvest site looks red on the skin edges with areas of necrosis. She is advised to keep her blood glucose level well controlled.  She states that she is back on her metformin.  She has not had any fevers or chills.  She is also  prescribed today Keflex 500 mg twice daily x10 days.  She was also prescribed Lasix 40 mg daily with potassium 20 MEQ daily x5 days.  She is being monitored by home health nursing and is told that if anything changes or her incision begins to look worse she is to call our office.  She does have an appointment on Monday, 07/19/2018 where we can further evaluate her incision and fluid status.  I have also ordered a be met to be performed on Monday to evaluate her creatinine since it was elevated when she was in the hospital.  Other than her concern for her radial artery harvest site and her  peripheral edema she has no other complaints today.  She was able to get all her prescriptions filled appropriately and is now back on her blood pressure medication as well as her metformin.  Chest x-ray was reviewed with the patient and all questions were answered.  She does not need a repeat chest x-ray when she comes back to the office on Monday.  Next week she has an appointment with her cardiology team and also her primary care provider.  Instructions were given to wash her radial artery harvest site with soap and water pat it dry with a clean towel.  Betadine sticks provided today and she is asked to paint the area with the Betadine sticks after showering and allow time to try before getting dressed.  She is to do this at least once a day.  She is encouraged to call our office if any changes occur but we will plan to see her on 07/19/2018 for further care.  New prescriptions given today are: Keflex 500 mg twice daily x10 days, Lasix 40 mg daily x5 days, potassium supplementation 20 MEQ daily x5 days.   Elgie Collard 07/13/2018

## 2018-07-14 ENCOUNTER — Telehealth: Payer: Self-pay

## 2018-07-14 DIAGNOSIS — M4726 Other spondylosis with radiculopathy, lumbar region: Secondary | ICD-10-CM | POA: Diagnosis not present

## 2018-07-14 DIAGNOSIS — E1151 Type 2 diabetes mellitus with diabetic peripheral angiopathy without gangrene: Secondary | ICD-10-CM | POA: Diagnosis not present

## 2018-07-14 DIAGNOSIS — I110 Hypertensive heart disease with heart failure: Secondary | ICD-10-CM | POA: Diagnosis not present

## 2018-07-14 DIAGNOSIS — I214 Non-ST elevation (NSTEMI) myocardial infarction: Secondary | ICD-10-CM | POA: Diagnosis not present

## 2018-07-14 DIAGNOSIS — I951 Orthostatic hypotension: Secondary | ICD-10-CM | POA: Diagnosis not present

## 2018-07-14 DIAGNOSIS — E11319 Type 2 diabetes mellitus with unspecified diabetic retinopathy without macular edema: Secondary | ICD-10-CM | POA: Diagnosis not present

## 2018-07-14 DIAGNOSIS — Z48812 Encounter for surgical aftercare following surgery on the circulatory system: Secondary | ICD-10-CM | POA: Diagnosis not present

## 2018-07-14 DIAGNOSIS — M48062 Spinal stenosis, lumbar region with neurogenic claudication: Secondary | ICD-10-CM | POA: Diagnosis not present

## 2018-07-14 DIAGNOSIS — F329 Major depressive disorder, single episode, unspecified: Secondary | ICD-10-CM | POA: Diagnosis not present

## 2018-07-14 DIAGNOSIS — E1142 Type 2 diabetes mellitus with diabetic polyneuropathy: Secondary | ICD-10-CM | POA: Diagnosis not present

## 2018-07-14 DIAGNOSIS — I2510 Atherosclerotic heart disease of native coronary artery without angina pectoris: Secondary | ICD-10-CM | POA: Diagnosis not present

## 2018-07-14 DIAGNOSIS — G2581 Restless legs syndrome: Secondary | ICD-10-CM | POA: Diagnosis not present

## 2018-07-14 DIAGNOSIS — I5032 Chronic diastolic (congestive) heart failure: Secondary | ICD-10-CM | POA: Diagnosis not present

## 2018-07-14 NOTE — Telephone Encounter (Signed)
STD forms faxed to [facility] and original papers mailed to home address per patients request

## 2018-07-16 DIAGNOSIS — M48062 Spinal stenosis, lumbar region with neurogenic claudication: Secondary | ICD-10-CM | POA: Diagnosis not present

## 2018-07-16 DIAGNOSIS — G2581 Restless legs syndrome: Secondary | ICD-10-CM | POA: Diagnosis not present

## 2018-07-16 DIAGNOSIS — M4726 Other spondylosis with radiculopathy, lumbar region: Secondary | ICD-10-CM | POA: Diagnosis not present

## 2018-07-16 DIAGNOSIS — I214 Non-ST elevation (NSTEMI) myocardial infarction: Secondary | ICD-10-CM | POA: Diagnosis not present

## 2018-07-16 DIAGNOSIS — E1142 Type 2 diabetes mellitus with diabetic polyneuropathy: Secondary | ICD-10-CM | POA: Diagnosis not present

## 2018-07-16 DIAGNOSIS — E1151 Type 2 diabetes mellitus with diabetic peripheral angiopathy without gangrene: Secondary | ICD-10-CM | POA: Diagnosis not present

## 2018-07-16 DIAGNOSIS — I951 Orthostatic hypotension: Secondary | ICD-10-CM | POA: Diagnosis not present

## 2018-07-16 DIAGNOSIS — I110 Hypertensive heart disease with heart failure: Secondary | ICD-10-CM | POA: Diagnosis not present

## 2018-07-16 DIAGNOSIS — I2510 Atherosclerotic heart disease of native coronary artery without angina pectoris: Secondary | ICD-10-CM | POA: Diagnosis not present

## 2018-07-16 DIAGNOSIS — E11319 Type 2 diabetes mellitus with unspecified diabetic retinopathy without macular edema: Secondary | ICD-10-CM | POA: Diagnosis not present

## 2018-07-16 DIAGNOSIS — I5032 Chronic diastolic (congestive) heart failure: Secondary | ICD-10-CM | POA: Diagnosis not present

## 2018-07-16 DIAGNOSIS — F329 Major depressive disorder, single episode, unspecified: Secondary | ICD-10-CM | POA: Diagnosis not present

## 2018-07-16 DIAGNOSIS — Z48812 Encounter for surgical aftercare following surgery on the circulatory system: Secondary | ICD-10-CM | POA: Diagnosis not present

## 2018-07-19 ENCOUNTER — Ambulatory Visit: Payer: BLUE CROSS/BLUE SHIELD | Admitting: Adult Health

## 2018-07-19 ENCOUNTER — Ambulatory Visit (INDEPENDENT_AMBULATORY_CARE_PROVIDER_SITE_OTHER): Payer: Self-pay | Admitting: Surgical

## 2018-07-19 ENCOUNTER — Other Ambulatory Visit: Payer: Self-pay

## 2018-07-19 VITALS — BP 172/97 | HR 112 | Resp 18 | Ht 62.0 in | Wt 159.4 lb

## 2018-07-19 DIAGNOSIS — R0989 Other specified symptoms and signs involving the circulatory and respiratory systems: Secondary | ICD-10-CM

## 2018-07-19 DIAGNOSIS — Z951 Presence of aortocoronary bypass graft: Secondary | ICD-10-CM

## 2018-07-19 MED ORDER — POTASSIUM CHLORIDE ER 20 MEQ PO TBCR: 20.0000 meq | EXTENDED_RELEASE_TABLET | Freq: Every day | ORAL | 0 refills | Status: DC

## 2018-07-19 MED ORDER — FUROSEMIDE 40 MG PO TABS: 40.0000 mg | ORAL_TABLET | Freq: Every day | ORAL | 0 refills | Status: DC

## 2018-07-19 NOTE — Progress Notes (Deleted)
Cardiology Office Note   Date:  07/19/2018   ID:  [name], DOB [birth year], MRN [id number]  PCP:  Mosie Lukes, MD  Cardiologist: Dr. [name]  No chief complaint on file.    History of Present Illness: [name] is a 52 y.o. female who presents for CAD s/p CABG after undergoing cardiac cath revealing severe multivessel disease on 06/24/2018. She had CABG on 06/28/2018 LIMA to LAD,SVG to Diagonal 1 and Ramus Intermediate, and radial artery to OM. She also underwent open radial harvest of her left arm and endoscopic harvest of greater saphenous vein from her right thigh. She required 1 RBC due to post operative anemia.    Past Medical History:  Diagnosis Date  . Acute on chronic diastolic CHF (congestive heart failure) ([place]) 06/24/2018  . Anxiety   . COMMON MIGRAINE 10/07/2010  . Decreased visual acuity 11/10/2016  . Depression 12/18/2012  . Diabetes mellitus type II, uncontrolled ([place]) 10/07/2010   Qualifier: Diagnosis of  By: Charlett Blake MD, [name]    . Diabetic foot infection ([place]) 08/26/2016  . Disturbance of skin sensation 10/07/2010  . Heart murmur   . History of kidney stones    "years ago"  . Hyperlipidemia 12/06/2010  . Hypertension   . Lipoma of abdominal wall 10/05/2016  . Overweight(278.02) 12/06/2010  . PERIPHERAL NEUROPATHY, FEET 10/07/2010  . PVD (peripheral vascular disease) ([place]) 01/21/2012  . RESTLESS LEG SYNDROME 10/25/2010  . Stroke [facility]) 2014, 2017   most recently in 2/17 - intracerebral hemorrhage    Past Surgical History:  Procedure Laterality Date  . AMPUTATION TOE Left 09/09/2016   Procedure: AMPUTATION OF LEFT GREAT TOE;  Surgeon: Milly Jakob, MD;  Location: [place];  Service: Orthopedics;  Laterality: Left;  . CORONARY ARTERY BYPASS GRAFT N/A 06/28/2018   Procedure: CORONARY ARTERY BYPASS GRAFTING (CABG) times  four, using left internal mammary artery, endoscopically harvested right saphenous vein, and harvested left radial  artery;  Surgeon: [name], MD;  Location: [place];  Service: Open Heart Surgery;  Laterality: N/A;  . ENDOVEIN HARVEST OF GREATER SAPHENOUS VEIN Right 06/28/2018   Procedure: ENDOVEIN HARVEST OF GREATER SAPHENOUS VEIN;  Surgeon: [name], MD;  Location: [place];  Service: Open Heart Surgery;  Laterality: Right;  . LEFT HEART CATH AND CORONARY ANGIOGRAPHY N/A 06/24/2018   Procedure: LEFT HEART CATH AND CORONARY ANGIOGRAPHY;  Surgeon: Troy Sine, MD;  Location: [place] [facility];  Service: Cardiovascular;  Laterality: N/A;  . RADIAL ARTERY HARVEST Left 06/28/2018   Procedure: RADIAL ARTERY HARVEST;  Surgeon: [name], MD;  Location: [place];  Service: Open Heart Surgery;  Laterality: Left;  . TEE WITHOUT CARDIOVERSION N/A 06/28/2018   Procedure: TRANSESOPHAGEAL ECHOCARDIOGRAM (TEE);  Surgeon: [name], MD;  Location: [place];  Service: Open Heart Surgery;  Laterality: N/A;  . WISDOM TOOTH EXTRACTION       Current Outpatient Medications  Medication Sig Dispense Refill  . acetaminophen (TYLENOL) 325 MG tablet Take 2 tablets (650 mg total) by mouth every 6 (six) hours as needed for mild pain or headache.    Marland Kitchen amLODipine (NORVASC) 5 MG tablet Take 1 tablet (5 mg total) by mouth daily. 30 tablet 1  . aspirin EC 325 MG EC tablet Take 1 tablet (325 mg total) by mouth daily. 30 tablet 0  . atorvastatin (LIPITOR) 80 MG tablet Take 1 tablet (80 mg total) by mouth daily at 6 PM. 30 tablet 1  .  azelastine (ASTELIN) 0.1 % nasal spray Place 2 sprays into both nostrils 2 (two) times daily. Use in each nostril as directed (Patient taking differently: Place 2 sprays into both nostrils 2 (two) times daily as needed for allergies. ) 30 mL 3  . cephALEXin (KEFLEX) 500 MG capsule Take 1 capsule (500 mg total) by mouth 2 (two) times daily for 10 days. 20 capsule 0  . escitalopram (LEXAPRO) 10 MG tablet Take 1 tablet (10 mg total) by mouth daily. 30 tablet 0  .  fluticasone (FLONASE) 50 MCG/ACT nasal spray Place 2 sprays into both nostrils daily. (Patient taking differently: Place 2 sprays into both nostrils daily as needed for allergies. ) 16 g 1  . furosemide (LASIX) 40 MG tablet Take 1 tablet (40 mg total) by mouth daily. 5 tablet 0  . gabapentin (NEURONTIN) 100 MG capsule Take 3 capsules (300 mg total) by mouth 3 (three) times daily. 90 capsule 0  . HYDROcodone-acetaminophen (NORCO) 10-325 MG tablet Take one tablet every 4-6 hours PRN severe pain 20 tablet 0  . isosorbide mononitrate (IMDUR) 30 MG 24 hr tablet Take 0.5 tablets (15 mg total) by mouth daily. For one month then stop. 30 tablet 0  . LANTUS SOLOSTAR 100 UNIT/ML Solostar Pen 100 units sq in am and 50 units sq in pm (Patient taking differently: Inject 50-100 Units into the skin See admin instructions. Use 100 units every morning and use 50 units every evening) 45 mL 4  . levothyroxine (SYNTHROID, LEVOTHROID) 25 MCG tablet Take 1 tablet (25 mcg total) by mouth daily before breakfast. Appointment needed for more refills. 15 tablet 0  . metFORMIN (GLUCOPHAGE) 1000 MG tablet Take 1 tablet every morning then take 1/2 tablet at lunch and at bedtime 60 tablet 0  . metoprolol tartrate (LOPRESSOR) 25 MG tablet Take 1 tablet (25 mg total) by mouth 2 (two) times daily. 60 tablet 1  . ONE TOUCH ULTRA TEST test strip USE ONCE DAILY TO CHECK BLOOD SUGAR. DX E11.9  2  . ONE TOUCH ULTRA TEST test strip USE ONCE DAILY TO CHECK BLOOD SUGAR. DX E11.9 100 each 1  . potassium chloride 20 MEQ TBCR Take 20 mEq by mouth daily. 5 tablet 0  . tizanidine (ZANAFLEX) 2 MG capsule Take 1 capsule (2 mg total) by mouth 2 (two) times daily as needed for muscle spasms. 30 capsule 0   No current facility-administered medications for this visit.     Allergies:   Lyrica [pregabalin]; Morphine and related; and Tramadol    Social History:  The patient  reports that she quit smoking about 12 years ago. She smoked 0.50 packs per  day. She has never used smokeless tobacco. She reports that she does not drink alcohol or use drugs.   Family History:  The patient's family history includes Alcohol abuse in her brother; Arthritis in her mother; Diabetes in her paternal grandmother; Healthy in her son; Leukemia in her brother; Stroke in her brother.    ROS: All other systems are reviewed and negative. Unless otherwise mentioned in H&P    PHYSICAL EXAM: VS:  LMP 02/12/2011 (Exact Date)  , BMI There is no height or weight on file to calculate BMI. GEN: Well nourished, well developed, in no acute distress HEENT: normal Neck: no JVD, carotid bruits, or masses Cardiac: ***RRR; no murmurs, rubs, or gallops,no edema  Respiratory:  Clear to auscultation bilaterally, normal work of breathing GI: soft, nontender, nondistended, + BS MS: no deformity or atrophy Skin:  warm and dry, no rash Neuro:  Strength and sensation are intact Psych: euthymic mood, full affect   EKG:  EKG {ACTION; IS/IS HER:[id number]} ordered today. The ekg ordered today demonstrates ***   Recent Labs: 06/23/2018: B Natriuretic Peptide 335.2 06/24/2018: ALT 20 06/29/2018: Magnesium 2.8 07/02/2018: TSH 3.396 07/04/2018: Hemoglobin 8.8; Platelets 432 07/05/2018: BUN 22; Potassium 4.6; Sodium 136 07/06/2018: Creatinine, Ser 1.35    Lipid Panel    Component Value Date/Time   CHOL 200 06/24/2018 1045   TRIG 127 06/24/2018 1045   HDL 36 (L) 06/24/2018 1045   CHOLHDL 5.6 06/24/2018 1045   VLDL 25 06/24/2018 1045   LDLCALC 139 (H) 06/24/2018 1045   LDLDIRECT 133.0 03/02/2017 0838      Wt Readings from Last 3 Encounters:  07/13/18 158 lb (71.7 kg)  07/06/18 160 lb 15 oz (73 kg)  06/10/18 170 lb (77.1 kg)      Other studies Reviewed: Stress Test 06/24/2018 Conclusion     Ost RPDA to RPDA lesion is 40% stenosed.  RPDA-1 lesion is 50% stenosed.  RPDA-2 lesion is 80% stenosed.  Prox RCA lesion is 20% stenosed.  Mid RCA lesion is 20%  stenosed.  Mid LM to Dist LM lesion is 80% stenosed.  Dist LM to Prox LAD lesion is 85% stenosed.  Prox LAD to Mid LAD lesion is 80% stenosed.  Ost 2nd Diag to 2nd Diag lesion is 95% stenosed.  Mid LAD lesion is 95% stenosed.  Dist LAD lesion is 99% stenosed.  Prox Cx to Mid Cx lesion is 60% stenosed.  LV end diastolic pressure is severely elevated.  There is moderate left ventricular systolic dysfunction.   Severe multivessel CAD with 80% distal left main stenosis, calcification of the proximal LAD with diffuse 85 to 90% stenosis before the first diagonal vessel, 80% stenoses between the first and second diagonal vessel with 95% ostial stenosis of the second diagonal and LAD beyond the diagonal vessel; the LAD is subtotally occluded in the distal LAD after the third diagonal vessel with faint filling antegrade; 60% proximal left circumflex stenosis with 50% distal circumflex marginal stenosis; large dominant RCA with 20% tandem mid RCA stenoses, and PDA stenosis of 40% ostially 50% in the mid segment and 80% distally.  Moderate LV dysfunction with an ejection fraction of 40 to 45% with hypocontractility involving the mid distal anterolateral wall extending to the apex.  LVEDP is elevated at 29 mm.      ASSESSMENT AND PLAN:  1.  ***   Current medicines are reviewed at length with the patient today.    Labs/ tests ordered today include: *** Phill Myron. West Pugh, ANP, [facility]   07/19/2018 7:47 AM    [address] Office [phone] Fax [phone]

## 2018-07-19 NOTE — Progress Notes (Signed)
[address]       [place] [address]             [phone]      Mishell S Gatlin [facility] Medical Record #[id number] Date of Birth: Dec 01, 1965  Referring: Troy Sine, MD Primary Care: Mosie Lukes, MD Primary Cardiologist: Elouise Munroe, MD   Chief Complaint:   POST OP FOLLOW UP OPERATIVE REPORT  DATE OF PROCEDURE:  06/28/2018  PREOPERATIVE DIAGNOSIS:  Severe 3-vessel coronary disease.  POSTOPERATIVE DIAGNOSIS:  Severe 3-vessel coronary disease.  PROCEDURES:  Median sternotomy, extracorporeal circulation Coronary artery bypass grafting x 4      Left internal mammary artery to left anterior descending,      Saphenous vein graft to first diagonal and an anterolateral 1,      Left radial artery to OM1  Endoscopic vein harvest right thigh  SURGEON:  Modesto Charon, MD  ASSISTANT:  Jadene Pierini, [place]:  Nicholes Rough, PA-C.  ANESTHESIA:  General.    History of Present Illness:    Patient seen again in the office today in follow-up.  She was seen last week where she was found to have some evidence of cellulitis associated with her left radial artery harvest site.  He was started on Keflex p.o. at that time.  Additionally she has some lower extremity edema and was started on daily Lasix 40 mg with 20 mEq of daily potassium.  She denies fevers, chills or other significant constitutional symptoms.  There has been no drainage from the incision.  The redness associated with the incision she feels is about the same as it has been.  She states her blood sugars have been under better control overall.  She missed her cardiology appointment today.  Overall she does feel as though she is getting stronger.  She denies chest pain or shortness of breath.  She is not having palpitations.  She did not have a chest x-ray on today's date.      Past Medical History:  Diagnosis Date  . Acute on chronic diastolic CHF (congestive  heart failure) ([place]) 06/24/2018  . Anxiety   . COMMON MIGRAINE 10/07/2010  . Decreased visual acuity 11/10/2016  . Depression 12/18/2012  . Diabetes mellitus type II, uncontrolled ([place]) 10/07/2010   Qualifier: Diagnosis of  By: Charlett Blake MD, [name]    . Diabetic foot infection ([place]) 08/26/2016  . Disturbance of skin sensation 10/07/2010  . Heart murmur   . History of kidney stones    "years ago"  . Hyperlipidemia 12/06/2010  . Hypertension   . Lipoma of abdominal wall 10/05/2016  . Overweight(278.02) 12/06/2010  . PERIPHERAL NEUROPATHY, FEET 10/07/2010  . PVD (peripheral vascular disease) ([place]) 01/21/2012  . RESTLESS LEG SYNDROME 10/25/2010  . Stroke [facility]) 2014, 2017   most recently in 2/17 - intracerebral hemorrhage     Social History   Tobacco Use  Smoking Status Former Smoker  . Packs/day: 0.50  . Last attempt to quit: 09/08/2005  . Years since quitting: 12.8  Smokeless Tobacco Never Used    Social History   Substance and Sexual Activity  Alcohol Use No  . Alcohol/week: 0.0 standard drinks     Allergies  Allergen Reactions  . Lyrica [Pregabalin] Other (See Comments)    MADE PATIENT VERY EMOTIONAL AND WOULD CRY EASILY  . Morphine And Related Nausea And Vomiting  . Tramadol Nausea And Vomiting    Pt cant tolerate this pain  med.     Current Outpatient Medications  Medication Sig Dispense Refill  . acetaminophen (TYLENOL) 325 MG tablet Take 2 tablets (650 mg total) by mouth every 6 (six) hours as needed for mild pain or headache.    Marland Kitchen amLODipine (NORVASC) 5 MG tablet Take 1 tablet (5 mg total) by mouth daily. 30 tablet 1  . aspirin EC 325 MG EC tablet Take 1 tablet (325 mg total) by mouth daily. 30 tablet 0  . atorvastatin (LIPITOR) 80 MG tablet Take 1 tablet (80 mg total) by mouth daily at 6 PM. 30 tablet 1  . azelastine (ASTELIN) 0.1 % nasal spray Place 2 sprays into both nostrils 2 (two) times daily. Use in each nostril as directed (Patient taking differently: Place 2 sprays  into both nostrils 2 (two) times daily as needed for allergies. ) 30 mL 3  . cephALEXin (KEFLEX) 500 MG capsule Take 1 capsule (500 mg total) by mouth 2 (two) times daily for 10 days. 20 capsule 0  . escitalopram (LEXAPRO) 10 MG tablet Take 1 tablet (10 mg total) by mouth daily. 30 tablet 0  . fluticasone (FLONASE) 50 MCG/ACT nasal spray Place 2 sprays into both nostrils daily. (Patient taking differently: Place 2 sprays into both nostrils daily as needed for allergies. ) 16 g 1  . furosemide (LASIX) 40 MG tablet Take 1 tablet (40 mg total) by mouth daily. 5 tablet 0  . gabapentin (NEURONTIN) 100 MG capsule Take 3 capsules (300 mg total) by mouth 3 (three) times daily. 90 capsule 0  . HYDROcodone-acetaminophen (NORCO) 10-325 MG tablet Take one tablet every 4-6 hours PRN severe pain 20 tablet 0  . isosorbide mononitrate (IMDUR) 30 MG 24 hr tablet Take 0.5 tablets (15 mg total) by mouth daily. For one month then stop. 30 tablet 0  . LANTUS SOLOSTAR 100 UNIT/ML Solostar Pen 100 units sq in am and 50 units sq in pm (Patient taking differently: Inject 50-100 Units into the skin See admin instructions. Use 100 units every morning and use 50 units every evening) 45 mL 4  . levothyroxine (SYNTHROID, LEVOTHROID) 25 MCG tablet Take 1 tablet (25 mcg total) by mouth daily before breakfast. Appointment needed for more refills. 15 tablet 0  . metFORMIN (GLUCOPHAGE) 1000 MG tablet Take 1 tablet every morning then take 1/2 tablet at lunch and at bedtime 60 tablet 0  . metoprolol tartrate (LOPRESSOR) 25 MG tablet Take 1 tablet (25 mg total) by mouth 2 (two) times daily. 60 tablet 1  . ONE TOUCH ULTRA TEST test strip USE ONCE DAILY TO CHECK BLOOD SUGAR. DX E11.9  2  . ONE TOUCH ULTRA TEST test strip USE ONCE DAILY TO CHECK BLOOD SUGAR. DX E11.9 100 each 1  . potassium chloride 20 MEQ TBCR Take 20 mEq by mouth daily. 5 tablet 0  . tizanidine (ZANAFLEX) 2 MG capsule Take 1 capsule (2 mg total) by mouth 2 (two) times  daily as needed for muscle spasms. 30 capsule 0   No current facility-administered medications for this visit.        Physical Exam: Ht 5\' 2"  (1.575 m)   LMP 02/12/2011 (Exact Date)   BMI 28.90 kg/m   General appearance: alert, cooperative and no distress Heart: regular rate and rhythm Lungs: clear to auscultation bilaterally Extremities: Bilateral lower extremity edema Wound: The left arm radial artery harvest site shows some necrotic skin edges with some increased induration.  It does not appear like classic cellulitis.  No purulent  drainage.   Diagnostic Studies & Laboratory data:     Recent Radiology Findings:   No results found.    Recent Lab Findings: Lab Results  Component Value Date   WBC 9.4 07/04/2018   HGB 8.8 (L) 07/04/2018   HCT 28.3 (L) 07/04/2018   PLT 432 (H) 07/04/2018   GLUCOSE 174 (H) 07/05/2018   CHOL 200 06/24/2018   TRIG 127 06/24/2018   HDL 36 (L) 06/24/2018   LDLDIRECT 133.0 03/02/2017   LDLCALC 139 (H) 06/24/2018   ALT 20 06/24/2018   AST 22 06/24/2018   NA 136 07/05/2018   K 4.6 07/05/2018   CL 99 07/05/2018   CREATININE 1.35 (H) 07/06/2018   BUN 22 (H) 07/05/2018   CO2 28 07/05/2018   TSH 3.396 07/02/2018   INR 1.30 06/28/2018   HGBA1C 11.1 (H) 06/26/2018      Assessment / Plan: Left arm radial artery harvest site appears to be showing slow healing.  There is no obvious cellulitis or purulence but there is some induration bordering the incision.  This may be more of a suture reaction or nonhealing as there is some evidence of necrotic skin edges and her other incisions as well.  We will continue the 10-day course of Keflex until finished in case there is a infection component.  She was noted to have an elevated blood pressure in the office however she states it is much better than this when the advanced home care team is checking.  She says typical blood pressure is in the 130s over 70s.  I will not change medication at this time.  She  is going to reestablish a cardiology follow-up appointment as she missed one scheduled for today.  I will also give her an additional 7 days of Lasix and potassium 40/20 mg.  She is instructed to keep her lower extremities elevated as well as much as is feasible.  She is also instructed to trial her diabetes as much as it is to her ability.  She was given instructions on signs and symptoms to follow-up for potential worsening of infection including fevers, chills, increasing erythema or purulent drainage.  She will contact our office if she has any of these otherwise we plan to see her in 1 week for recheck.          John Giovanni, PA-C 07/19/2018 3:58 PM

## 2018-07-20 ENCOUNTER — Ambulatory Visit (INDEPENDENT_AMBULATORY_CARE_PROVIDER_SITE_OTHER): Payer: BLUE CROSS/BLUE SHIELD | Admitting: Family Medicine

## 2018-07-20 ENCOUNTER — Encounter: Payer: Self-pay | Admitting: Family Medicine

## 2018-07-20 VITALS — BP 144/82 | HR 89 | Temp 98.2°F | Resp 16 | Ht 62.0 in | Wt 160.0 lb

## 2018-07-20 DIAGNOSIS — G609 Hereditary and idiopathic neuropathy, unspecified: Secondary | ICD-10-CM

## 2018-07-20 DIAGNOSIS — D638 Anemia in other chronic diseases classified elsewhere: Secondary | ICD-10-CM

## 2018-07-20 DIAGNOSIS — F32A Depression, unspecified: Secondary | ICD-10-CM

## 2018-07-20 DIAGNOSIS — E119 Type 2 diabetes mellitus without complications: Secondary | ICD-10-CM | POA: Diagnosis not present

## 2018-07-20 DIAGNOSIS — Z23 Encounter for immunization: Secondary | ICD-10-CM

## 2018-07-20 DIAGNOSIS — E785 Hyperlipidemia, unspecified: Secondary | ICD-10-CM

## 2018-07-20 DIAGNOSIS — E0842 Diabetes mellitus due to underlying condition with diabetic polyneuropathy: Secondary | ICD-10-CM

## 2018-07-20 DIAGNOSIS — I10 Essential (primary) hypertension: Secondary | ICD-10-CM | POA: Diagnosis not present

## 2018-07-20 DIAGNOSIS — F329 Major depressive disorder, single episode, unspecified: Secondary | ICD-10-CM

## 2018-07-20 LAB — COMPREHENSIVE METABOLIC PANEL
ALT: 11 U/L (ref 0–35)
AST: 10 U/L (ref 0–37)
Albumin: 3.4 g/dL — ABNORMAL LOW (ref 3.5–5.2)
Alkaline Phosphatase: 153 U/L — ABNORMAL HIGH (ref 39–117)
BUN: 23 mg/dL (ref 6–23)
CO2: 33 mEq/L — ABNORMAL HIGH (ref 19–32)
Calcium: 9.5 mg/dL (ref 8.4–10.5)
Chloride: 93 mEq/L — ABNORMAL LOW (ref 96–112)
Creatinine, Ser: 0.97 mg/dL (ref 0.40–1.20)
GFR: 63.46 mL/min (ref >60.00)
Glucose, Bld: 477 mg/dL — ABNORMAL HIGH (ref 70–99)
Potassium: 5.1 mEq/L (ref 3.5–5.1)
Sodium: 134 mEq/L — ABNORMAL LOW (ref 135–145)
Total Bilirubin: 0.4 mg/dL (ref 0.2–1.2)
Total Protein: 7.3 g/dL (ref 6.0–8.3)

## 2018-07-20 LAB — CBC
HCT: 33 % — ABNORMAL LOW (ref 36.0–46.0)
Hemoglobin: 10.7 g/dL — ABNORMAL LOW (ref 12.0–15.0)
MCHC: 32.4 g/dL (ref 30.0–36.0)
MCV: 81.7 fl (ref 78.0–100.0)
Platelets: 607 10*3/uL — ABNORMAL HIGH (ref 150.0–400.0)
RBC: 4.04 Mil/uL (ref 3.87–5.11)
RDW: 15.3 % (ref 11.5–15.5)
WBC: 7.6 10*3/uL (ref 4.0–10.5)

## 2018-07-20 MED ORDER — METFORMIN HCL 1000 MG PO TABS: ORAL_TABLET | ORAL | 5 refills | Status: DC

## 2018-07-20 NOTE — Patient Instructions (Signed)
Consider Ginger and Lemon tea for nausea   Carbohydrate Counting for Diabetes Mellitus, Adult Carbohydrate counting is a method for keeping track of how many carbohydrates you eat. Eating carbohydrates naturally increases the amount of sugar (glucose) in the blood. Counting how many carbohydrates you eat helps keep your blood glucose within normal limits, which helps you manage your diabetes (diabetes mellitus). It is important to know how many carbohydrates you can safely have in each meal. This is different for every person. A diet and nutrition specialist (registered dietitian) can help you make a meal plan and calculate how many carbohydrates you should have at each meal and snack. Carbohydrates are found in the following foods:  Grains, such as breads and cereals.  Dried beans and soy products.  Starchy vegetables, such as potatoes, peas, and corn.  Fruit and fruit juices.  Milk and yogurt.  Sweets and snack foods, such as cake, cookies, candy, chips, and soft drinks.  How do I count carbohydrates? There are two ways to count carbohydrates in food. You can use either of the methods or a combination of both. Reading "Nutrition Facts" on packaged food The "Nutrition Facts" list is included on the labels of almost all packaged foods and beverages in the [place] It includes:  The serving size.  Information about nutrients in each serving, including the grams (g) of carbohydrate per serving.  To use the "Nutrition Facts":  Decide how many servings you will have.  Multiply the number of servings by the number of carbohydrates per serving.  The resulting number is the total amount of carbohydrates that you will be having.  Learning standard serving sizes of other foods When you eat foods containing carbohydrates that are not packaged or do not include "Nutrition Facts" on the label, you need to measure the servings in order to count the amount of carbohydrates:  Measure the foods  that you will eat with a food scale or measuring cup, if needed.  Decide how many standard-size servings you will eat.  Multiply the number of servings by 15. Most carbohydrate-rich foods have about 15 g of carbohydrates per serving. ? For example, if you eat 8 oz (170 g) of strawberries, you will have eaten 2 servings and 30 g of carbohydrates (2 servings x 15 g = 30 g).  For foods that have more than one food mixed, such as soups and casseroles, you must count the carbohydrates in each food that is included.  The following list contains standard serving sizes of common carbohydrate-rich foods. Each of these servings has about 15 g of carbohydrates:   hamburger bun or  English muffin.   oz (15 mL) syrup.   oz (14 g) jelly.  1 slice of bread.  1 six-inch tortilla.  3 oz (85 g) cooked rice or pasta.  4 oz (113 g) cooked dried beans.  4 oz (113 g) starchy vegetable, such as peas, corn, or potatoes.  4 oz (113 g) hot cereal.  4 oz (113 g) mashed potatoes or  of a large baked potato.  4 oz (113 g) canned or frozen fruit.  4 oz (120 mL) fruit juice.  4-6 crackers.  6 chicken nuggets.  6 oz (170 g) unsweetened dry cereal.  6 oz (170 g) plain fat-free yogurt or yogurt sweetened with artificial sweeteners.  8 oz (240 mL) milk.  8 oz (170 g) fresh fruit or one small piece of fruit.  24 oz (680 g) popped popcorn.  Example of carbohydrate counting  Sample meal  3 oz (85 g) chicken breast.  6 oz (170 g) brown rice.  4 oz (113 g) corn.  8 oz (240 mL) milk.  8 oz (170 g) strawberries with sugar-free whipped topping. Carbohydrate calculation 1. Identify the foods that contain carbohydrates: ? Rice. ? Corn. ? Milk. ? Strawberries. 2. Calculate how many servings you have of each food: ? 2 servings rice. ? 1 serving corn. ? 1 serving milk. ? 1 serving strawberries. 3. Multiply each number of servings by 15 g: ? 2 servings rice x 15 g = 30 g. ? 1 serving  corn x 15 g = 15 g. ? 1 serving milk x 15 g = 15 g. ? 1 serving strawberries x 15 g = 15 g. 4. Add together all of the amounts to find the total grams of carbohydrates eaten: ? 30 g + 15 g + 15 g + 15 g = 75 g of carbohydrates total. This information is not intended to replace advice given to you by your health care provider. Make sure you discuss any questions you have with your health care provider. Document Released: 08/25/2005 Document Revised: 03/14/2016 Document Reviewed: 02/06/2016 Elsevier Interactive Patient Education  [employer].

## 2018-07-22 ENCOUNTER — Encounter: Payer: Self-pay | Admitting: Adult Health

## 2018-07-23 DIAGNOSIS — I110 Hypertensive heart disease with heart failure: Secondary | ICD-10-CM | POA: Diagnosis not present

## 2018-07-23 DIAGNOSIS — E11319 Type 2 diabetes mellitus with unspecified diabetic retinopathy without macular edema: Secondary | ICD-10-CM | POA: Diagnosis not present

## 2018-07-23 DIAGNOSIS — E1151 Type 2 diabetes mellitus with diabetic peripheral angiopathy without gangrene: Secondary | ICD-10-CM | POA: Diagnosis not present

## 2018-07-23 DIAGNOSIS — I951 Orthostatic hypotension: Secondary | ICD-10-CM | POA: Diagnosis not present

## 2018-07-23 DIAGNOSIS — I5032 Chronic diastolic (congestive) heart failure: Secondary | ICD-10-CM | POA: Diagnosis not present

## 2018-07-23 DIAGNOSIS — M48062 Spinal stenosis, lumbar region with neurogenic claudication: Secondary | ICD-10-CM | POA: Diagnosis not present

## 2018-07-23 DIAGNOSIS — I214 Non-ST elevation (NSTEMI) myocardial infarction: Secondary | ICD-10-CM | POA: Diagnosis not present

## 2018-07-23 DIAGNOSIS — G2581 Restless legs syndrome: Secondary | ICD-10-CM | POA: Diagnosis not present

## 2018-07-23 DIAGNOSIS — F329 Major depressive disorder, single episode, unspecified: Secondary | ICD-10-CM | POA: Diagnosis not present

## 2018-07-23 DIAGNOSIS — I2510 Atherosclerotic heart disease of native coronary artery without angina pectoris: Secondary | ICD-10-CM | POA: Diagnosis not present

## 2018-07-23 DIAGNOSIS — Z48812 Encounter for surgical aftercare following surgery on the circulatory system: Secondary | ICD-10-CM | POA: Diagnosis not present

## 2018-07-23 DIAGNOSIS — M4726 Other spondylosis with radiculopathy, lumbar region: Secondary | ICD-10-CM | POA: Diagnosis not present

## 2018-07-23 DIAGNOSIS — E1142 Type 2 diabetes mellitus with diabetic polyneuropathy: Secondary | ICD-10-CM | POA: Diagnosis not present

## 2018-07-24 MED ORDER — METFORMIN HCL 1000 MG PO TABS: ORAL_TABLET | ORAL | 5 refills | Status: DC

## 2018-07-24 NOTE — Assessment & Plan Note (Signed)
Struggling with the recent loss of her husband to cancer but is trying to stay active with her family. Is hoping to return to work at some point.

## 2018-07-24 NOTE — Assessment & Plan Note (Signed)
Encouraged heart healthy diet, increase exercise, avoid trans fats, consider a krill oil cap daily 

## 2018-07-24 NOTE — Progress Notes (Signed)
Subjective:    [name], female    DOB: 05/05/1966, 52 y.o.   MRN: [id number]  Chief Complaint  Patient presents with  . Diabetes    Here for follow up, needs Metformin refill  . Follow-up    follow up after open heart surgery 06-28-18    HPI Patient is in today for follow up. No recent febrile illness or hospitalizations. She is struggling with grief after loosing her husband to cancer. She is spending more time with her family now. She has still not been able to return to work. Her peripheral neuropathy continues to be bad daily but her meds help her manage the pain. No polyuria or polydipsia. Denies CP/palp/SOB/HA/congestion/fevers/GI or GU c/o. Taking meds as prescribed  Past Medical History:  Diagnosis Date  . Acute on chronic diastolic CHF (congestive heart failure) ([place]) 06/24/2018  . Anxiety   . COMMON MIGRAINE 10/07/2010  . Decreased visual acuity 11/10/2016  . Depression 12/18/2012  . Diabetes mellitus type II, uncontrolled ([place]) 10/07/2010   Qualifier: Diagnosis of  By: Charlett Blake MD, [name]    . Diabetic foot infection ([place]) 08/26/2016  . Disturbance of skin sensation 10/07/2010  . Heart murmur   . History of kidney stones    "years ago"  . Hyperlipidemia 12/06/2010  . Hypertension   . Lipoma of abdominal wall 10/05/2016  . Overweight(278.02) 12/06/2010  . PERIPHERAL NEUROPATHY, FEET 10/07/2010  . PVD (peripheral vascular disease) ([place]) 01/21/2012  . RESTLESS LEG SYNDROME 10/25/2010  . Stroke [facility]) 2014, 2017   most recently in 2/17 - intracerebral hemorrhage    Past Surgical History:  Procedure Laterality Date  . AMPUTATION TOE Left 09/09/2016   Procedure: AMPUTATION OF LEFT GREAT TOE;  Surgeon: Milly Jakob, MD;  Location: [place];  Service: Orthopedics;  Laterality: Left;  . CORONARY ARTERY BYPASS GRAFT N/A 06/28/2018   Procedure: CORONARY ARTERY BYPASS GRAFTING (CABG) times  four, using left internal mammary artery, endoscopically  harvested right saphenous vein, and harvested left radial artery;  Surgeon: [name], MD;  Location: [place];  Service: Open Heart Surgery;  Laterality: N/A;  . ENDOVEIN HARVEST OF GREATER SAPHENOUS VEIN Right 06/28/2018   Procedure: ENDOVEIN HARVEST OF GREATER SAPHENOUS VEIN;  Surgeon: [name], MD;  Location: [place];  Service: Open Heart Surgery;  Laterality: Right;  . LEFT HEART CATH AND CORONARY ANGIOGRAPHY N/A 06/24/2018   Procedure: LEFT HEART CATH AND CORONARY ANGIOGRAPHY;  Surgeon: Troy Sine, MD;  Location: [place] [facility];  Service: Cardiovascular;  Laterality: N/A;  . RADIAL ARTERY HARVEST Left 06/28/2018   Procedure: RADIAL ARTERY HARVEST;  Surgeon: [name], MD;  Location: [place];  Service: Open Heart Surgery;  Laterality: Left;  . TEE WITHOUT CARDIOVERSION N/A 06/28/2018   Procedure: TRANSESOPHAGEAL ECHOCARDIOGRAM (TEE);  Surgeon: [name], MD;  Location: [place];  Service: Open Heart Surgery;  Laterality: N/A;  . WISDOM TOOTH EXTRACTION      Family History  Problem Relation Age of Onset  . Arthritis Mother   . Stroke Brother        previous smoker  . Alcohol abuse Brother        in remission  . Leukemia Brother   . Diabetes Paternal Grandmother   . Healthy Son     Social History   Socioeconomic History  . Marital status: Single    Spouse name: Not on file  . Number of children: Not on file  .  Years of education: Not on file  . Highest education level: Not on file  Occupational History  . Occupation: Glass blower/designer  Social Needs  . Financial resource strain: Not on file  . Food insecurity:    Worry: Not on file    Inability: Not on file  . Transportation needs:    Medical: Not on file    Non-medical: Not on file  Tobacco Use  . Smoking status: Former Smoker    Packs/day: 0.50    Last attempt to quit: 09/08/2005    Years since quitting: 12.8  . Smokeless tobacco: Never Used  Substance and Sexual  Activity  . Alcohol use: No    Alcohol/week: 0.0 standard drinks  . Drug use: No  . Sexual activity: Yes    Partners: Male    Birth control/protection: None  Lifestyle  . Physical activity:    Days per week: Not on file    Minutes per session: Not on file  . Stress: Not on file  Relationships  . Social connections:    Talks on phone: Not on file    Gets together: Not on file    Attends religious service: Not on file    Active member of club or organization: Not on file    Attends meetings of clubs or organizations: Not on file    Relationship status: Not on file  . Intimate partner violence:    Fear of current or ex partner: Not on file    Emotionally abused: Not on file    Physically abused: Not on file    Forced sexual activity: Not on file  Other Topics Concern  . Not on file  Social History Narrative   Lives with husband in a two story home.  Has one child.     Works as a Glass blower/designer.     Education: high school.    Outpatient Medications Prior to Visit  Medication Sig Dispense Refill  . acetaminophen (TYLENOL) 325 MG tablet Take 2 tablets (650 mg total) by mouth every 6 (six) hours as needed for mild pain or headache.    Marland Kitchen amLODipine (NORVASC) 5 MG tablet Take 1 tablet (5 mg total) by mouth daily. 30 tablet 1  . aspirin EC 325 MG EC tablet Take 1 tablet (325 mg total) by mouth daily. 30 tablet 0  . atorvastatin (LIPITOR) 80 MG tablet Take 1 tablet (80 mg total) by mouth daily at 6 PM. 30 tablet 1  . azelastine (ASTELIN) 0.1 % nasal spray Place 2 sprays into both nostrils 2 (two) times daily. Use in each nostril as directed (Patient taking differently: Place 2 sprays into both nostrils 2 (two) times daily as needed for allergies. ) 30 mL 3  . cephALEXin (KEFLEX) 500 MG capsule Take 1 capsule (500 mg total) by mouth 2 (two) times daily for 10 days. 20 capsule 0  . escitalopram (LEXAPRO) 10 MG tablet Take 1 tablet (10 mg total) by mouth daily. 30 tablet 0  .  fluticasone (FLONASE) 50 MCG/ACT nasal spray Place 2 sprays into both nostrils daily. (Patient taking differently: Place 2 sprays into both nostrils daily as needed for allergies. ) 16 g 1  . furosemide (LASIX) 40 MG tablet Take 1 tablet (40 mg total) by mouth daily. 7 tablet 0  . gabapentin (NEURONTIN) 100 MG capsule Take 3 capsules (300 mg total) by mouth 3 (three) times daily. 90 capsule 0  . HYDROcodone-acetaminophen (NORCO) 10-325 MG tablet Take one tablet every 4-6  hours PRN severe pain 20 tablet 0  . isosorbide mononitrate (IMDUR) 30 MG 24 hr tablet Take 0.5 tablets (15 mg total) by mouth daily. For one month then stop. 30 tablet 0  . LANTUS SOLOSTAR 100 UNIT/ML Solostar Pen 100 units sq in am and 50 units sq in pm (Patient taking differently: Inject 50-100 Units into the skin See admin instructions. Use 100 units every morning and use 50 units every evening) 45 mL 4  . levothyroxine (SYNTHROID, LEVOTHROID) 25 MCG tablet Take 1 tablet (25 mcg total) by mouth daily before breakfast. Appointment needed for more refills. 15 tablet 0  . metFORMIN (GLUCOPHAGE) 1000 MG tablet Take 1 tablet every morning then take 1/2 tablet at lunch and at bedtime 60 tablet 0  . metoprolol tartrate (LOPRESSOR) 25 MG tablet Take 1 tablet (25 mg total) by mouth 2 (two) times daily. 60 tablet 1  . ONE TOUCH ULTRA TEST test strip USE ONCE DAILY TO CHECK BLOOD SUGAR. DX E11.9  2  . ONE TOUCH ULTRA TEST test strip USE ONCE DAILY TO CHECK BLOOD SUGAR. DX E11.9 100 each 1  . Potassium Chloride ER 20 MEQ TBCR Take 20 mEq by mouth daily. 7 tablet 0  . tizanidine (ZANAFLEX) 2 MG capsule Take 1 capsule (2 mg total) by mouth 2 (two) times daily as needed for muscle spasms. 30 capsule 0  . ondansetron (ZOFRAN) 4 MG tablet Take 1 tablet (4 mg total) by mouth every 8 (eight) hours as needed for nausea or vomiting. 20 tablet 0   No facility-administered medications prior to visit.     Allergies  Allergen Reactions  . Lyrica  [Pregabalin] Other (See Comments)    MADE PATIENT VERY EMOTIONAL AND WOULD CRY EASILY  . Morphine And Related Nausea And Vomiting  . Tramadol Nausea And Vomiting    Pt cant tolerate this pain med.     Review of Systems  Constitutional: Positive for malaise/fatigue. Negative for fever.  HENT: Negative for congestion.   Eyes: Negative for blurred vision.  Respiratory: Negative for shortness of breath.   Cardiovascular: Negative for chest pain, palpitations and leg swelling.  Gastrointestinal: Negative for abdominal pain, blood in stool and nausea.  Genitourinary: Negative for dysuria and frequency.  Musculoskeletal: Positive for back pain and joint pain. Negative for falls.  Skin: Negative for rash.  Neurological: Negative for dizziness, loss of consciousness and headaches.  Endo/Heme/Allergies: Negative for environmental allergies.  Psychiatric/Behavioral: Positive for depression and substance abuse. The patient is nervous/anxious.        Objective:    Physical Exam  Constitutional: She is oriented to person, place, and time. She appears well-developed and well-nourished. No distress.  HENT:  Head: Normocephalic and atraumatic.  Eyes: Conjunctivae are normal.  Neck: Neck supple. No thyromegaly present.  Cardiovascular: Normal rate and regular rhythm.  Murmur heard. Pulmonary/Chest: Effort normal and breath sounds normal. No respiratory distress.  Abdominal: Soft. Bowel sounds are normal. She exhibits no distension and no mass. There is no tenderness.  Musculoskeletal: She exhibits no edema.  Lymphadenopathy:    She has no cervical adenopathy.  Neurological: She is alert and oriented to person, place, and time.  Skin: Skin is warm and dry.  Psychiatric: She has a normal mood and affect. Her behavior is normal.    BP (!) 144/82 (BP Location: Right Arm, Patient Position: Sitting, Cuff Size: Small)   Pulse 89   Temp 98.2 F (36.8 C) (Oral)   Resp 16   Ht  5\' 2"  (1.575 m)    Wt 160 lb (72.6 kg)   LMP 02/12/2011 (Exact Date)   SpO2 98%   BMI 29.26 kg/m  Wt Readings from Last 3 Encounters:  07/20/18 160 lb (72.6 kg)  07/19/18 159 lb 6.4 oz (72.3 kg)  07/13/18 158 lb (71.7 kg)     Lab Results  Component Value Date   WBC 7.6 07/20/2018   HGB 10.7 (L) 07/20/2018   HCT 33.0 (L) 07/20/2018   PLT 607.0 (H) 07/20/2018   GLUCOSE 477 (H) 07/20/2018   CHOL 200 06/24/2018   TRIG 127 06/24/2018   HDL 36 (L) 06/24/2018   LDLDIRECT 133.0 03/02/2017   LDLCALC 139 (H) 06/24/2018   ALT 11 07/20/2018   AST 10 07/20/2018   NA 134 (L) 07/20/2018   K 5.1 07/20/2018   CL 93 (L) 07/20/2018   CREATININE 0.97 07/20/2018   BUN 23 07/20/2018   CO2 33 (H) 07/20/2018   TSH 3.396 07/02/2018   INR 1.30 06/28/2018   HGBA1C 11.1 (H) 06/26/2018   MICROALBUR 174.3 (H) 08/14/2017    Lab Results  Component Value Date   TSH 3.396 07/02/2018   Lab Results  Component Value Date   WBC 7.6 07/20/2018   HGB 10.7 (L) 07/20/2018   HCT 33.0 (L) 07/20/2018   MCV 81.7 07/20/2018   PLT 607.0 (H) 07/20/2018   Lab Results  Component Value Date   NA 134 (L) 07/20/2018   K 5.1 07/20/2018   CO2 33 (H) 07/20/2018   GLUCOSE 477 (H) 07/20/2018   BUN 23 07/20/2018   CREATININE 0.97 07/20/2018   BILITOT 0.4 07/20/2018   ALKPHOS 153 (H) 07/20/2018   AST 10 07/20/2018   ALT 11 07/20/2018   PROT 7.3 07/20/2018   ALBUMIN 3.4 (L) 07/20/2018   CALCIUM 9.5 07/20/2018   ANIONGAP 9 07/05/2018   GFR 63.46 07/20/2018   Lab Results  Component Value Date   CHOL 200 06/24/2018   Lab Results  Component Value Date   HDL 36 (L) 06/24/2018   Lab Results  Component Value Date   LDLCALC 139 (H) 06/24/2018   Lab Results  Component Value Date   TRIG 127 06/24/2018   Lab Results  Component Value Date   CHOLHDL 5.6 06/24/2018   Lab Results  Component Value Date   HGBA1C 11.1 (H) 06/26/2018       Assessment & Plan:   Problem List Items Addressed This Visit    Anemia of  chronic disease (Chronic)   Relevant Orders   CBC (Completed)   Hereditary and idiopathic peripheral neuropathy    Continues to struggle with pain but  Manages day to day with her current meds.       Essential hypertension, benign   Relevant Orders   Comprehensive metabolic panel (Completed)   Hyperlipidemia    Encouraged heart healthy diet, increase exercise, avoid trans fats, consider a krill oil cap daily      Depression    Struggling with the recent loss of her husband to cancer but is trying to stay active with her family. Is hoping to return to work at some point.       Diabetic polyneuropathy associated with diabetes mellitus due to underlying condition ([place])    Continue current insulin and restart Metformin since her kidney functions are good.       Relevant Medications   metFORMIN (GLUCOPHAGE) 1000 MG tablet    Other Visit Diagnoses    Needs flu shot    -  Primary   Relevant Orders   Flu Vaccine QUAD 36+ mos IM (Fluarix & Fluzone Quad PF   Diabetes mellitus ([place])       Relevant Medications   metFORMIN (GLUCOPHAGE) 1000 MG tablet      I am having Tiffany Fenner. Davis maintain her fluticasone, azelastine, tizanidine, ONE TOUCH ULTRA TEST, ONE TOUCH ULTRA TEST, LANTUS SOLOSTAR, acetaminophen, aspirin, metoprolol tartrate, isosorbide mononitrate, amLODipine, atorvastatin, HYDROcodone-acetaminophen, escitalopram, gabapentin, levothyroxine, metFORMIN, furosemide, Potassium Chloride ER, ondansetron, and metFORMIN.  Meds ordered this encounter  Medications  . DISCONTD: metFORMIN (GLUCOPHAGE) 1000 MG tablet    Sig: 1 tab po bid and 1/2 tab po q noon    Dispense:  75 tablet    Refill:  5  . metFORMIN (GLUCOPHAGE) 1000 MG tablet    Sig: 1 tab po bid and 1/2 tab po q noon    Dispense:  75 tablet    Refill:  5      Penni Homans, MD

## 2018-07-24 NOTE — Assessment & Plan Note (Signed)
Continue current insulin and restart Metformin since her kidney functions are good.

## 2018-07-24 NOTE — Assessment & Plan Note (Signed)
Continues to struggle with pain but  Manages day to day with her current meds.

## 2018-07-26 ENCOUNTER — Telehealth: Payer: Self-pay

## 2018-07-26 DIAGNOSIS — M48062 Spinal stenosis, lumbar region with neurogenic claudication: Secondary | ICD-10-CM | POA: Diagnosis not present

## 2018-07-26 DIAGNOSIS — G2581 Restless legs syndrome: Secondary | ICD-10-CM | POA: Diagnosis not present

## 2018-07-26 DIAGNOSIS — I214 Non-ST elevation (NSTEMI) myocardial infarction: Secondary | ICD-10-CM | POA: Diagnosis not present

## 2018-07-26 DIAGNOSIS — E11319 Type 2 diabetes mellitus with unspecified diabetic retinopathy without macular edema: Secondary | ICD-10-CM | POA: Diagnosis not present

## 2018-07-26 DIAGNOSIS — E1142 Type 2 diabetes mellitus with diabetic polyneuropathy: Secondary | ICD-10-CM | POA: Diagnosis not present

## 2018-07-26 DIAGNOSIS — I951 Orthostatic hypotension: Secondary | ICD-10-CM | POA: Diagnosis not present

## 2018-07-26 DIAGNOSIS — F329 Major depressive disorder, single episode, unspecified: Secondary | ICD-10-CM | POA: Diagnosis not present

## 2018-07-26 DIAGNOSIS — I5032 Chronic diastolic (congestive) heart failure: Secondary | ICD-10-CM | POA: Diagnosis not present

## 2018-07-26 DIAGNOSIS — I2510 Atherosclerotic heart disease of native coronary artery without angina pectoris: Secondary | ICD-10-CM | POA: Diagnosis not present

## 2018-07-26 DIAGNOSIS — I110 Hypertensive heart disease with heart failure: Secondary | ICD-10-CM | POA: Diagnosis not present

## 2018-07-26 DIAGNOSIS — Z48812 Encounter for surgical aftercare following surgery on the circulatory system: Secondary | ICD-10-CM | POA: Diagnosis not present

## 2018-07-26 DIAGNOSIS — E1151 Type 2 diabetes mellitus with diabetic peripheral angiopathy without gangrene: Secondary | ICD-10-CM | POA: Diagnosis not present

## 2018-07-26 DIAGNOSIS — M4726 Other spondylosis with radiculopathy, lumbar region: Secondary | ICD-10-CM | POA: Diagnosis not present

## 2018-07-26 NOTE — Telephone Encounter (Signed)
Estill Bamberg, nurse [phone] with [facility] contacted the office to say that Ms. Scheiderer missed a nursing visit because she was unable to reach her.  She stated that she contacted her phone and no response was returned.  She also stated that she went by the house to check on her but she stated no one was home.  She stated she would reach out to her next week to come back.

## 2018-07-27 ENCOUNTER — Other Ambulatory Visit: Payer: Self-pay | Admitting: Physician Assistant

## 2018-07-27 ENCOUNTER — Ambulatory Visit: Payer: Self-pay

## 2018-07-27 NOTE — Progress Notes (Deleted)
[address]       [place] [address]             [phone]      [name] is a 52 y.o. female patient who presents today for a left radial harvest wound check. No diagnosis found. Past Medical History:  Diagnosis Date  . Acute on chronic diastolic CHF (congestive heart failure) ([place]) 06/24/2018  . Anxiety   . COMMON MIGRAINE 10/07/2010  . Decreased visual acuity 11/10/2016  . Depression 12/18/2012  . Diabetes mellitus type II, uncontrolled ([place]) 10/07/2010   Qualifier: Diagnosis of  By: Charlett Blake MD, [name]    . Diabetic foot infection ([place]) 08/26/2016  . Disturbance of skin sensation 10/07/2010  . Heart murmur   . History of kidney stones    "years ago"  . Hyperlipidemia 12/06/2010  . Hypertension   . Lipoma of abdominal wall 10/05/2016  . Overweight(278.02) 12/06/2010  . PERIPHERAL NEUROPATHY, FEET 10/07/2010  . PVD (peripheral vascular disease) ([place]) 01/21/2012  . RESTLESS LEG SYNDROME 10/25/2010  . Stroke [facility]) 2014, 2017   most recently in 2/17 - intracerebral hemorrhage   No past surgical history pertinent negatives on file. Scheduled Meds: Current Outpatient Medications on File Prior to Visit  Medication Sig Dispense Refill  . acetaminophen (TYLENOL) 325 MG tablet Take 2 tablets (650 mg total) by mouth every 6 (six) hours as needed for mild pain or headache.    Marland Kitchen amLODipine (NORVASC) 5 MG tablet Take 1 tablet (5 mg total) by mouth daily. 30 tablet 1  . aspirin EC 325 MG EC tablet Take 1 tablet (325 mg total) by mouth daily. 30 tablet 0  . atorvastatin (LIPITOR) 80 MG tablet Take 1 tablet (80 mg total) by mouth daily at 6 PM. 30 tablet 1  . azelastine (ASTELIN) 0.1 % nasal spray Place 2 sprays into both nostrils 2 (two) times daily. Use in each nostril as directed (Patient taking differently: Place 2 sprays into both nostrils 2 (two) times daily as needed for allergies. ) 30 mL 3  . escitalopram (LEXAPRO) 10 MG tablet Take 1 tablet (10 mg total) by  mouth daily. 30 tablet 0  . fluticasone (FLONASE) 50 MCG/ACT nasal spray Place 2 sprays into both nostrils daily. (Patient taking differently: Place 2 sprays into both nostrils daily as needed for allergies. ) 16 g 1  . furosemide (LASIX) 40 MG tablet Take 1 tablet (40 mg total) by mouth daily. 7 tablet 0  . gabapentin (NEURONTIN) 100 MG capsule Take 3 capsules (300 mg total) by mouth 3 (three) times daily. 90 capsule 0  . HYDROcodone-acetaminophen (NORCO) 10-325 MG tablet Take one tablet every 4-6 hours PRN severe pain 20 tablet 0  . isosorbide mononitrate (IMDUR) 30 MG 24 hr tablet Take 0.5 tablets (15 mg total) by mouth daily. For one month then stop. 30 tablet 0  . LANTUS SOLOSTAR 100 UNIT/ML Solostar Pen 100 units sq in am and 50 units sq in pm (Patient taking differently: Inject 50-100 Units into the skin See admin instructions. Use 100 units every morning and use 50 units every evening) 45 mL 4  . levothyroxine (SYNTHROID, LEVOTHROID) 25 MCG tablet Take 1 tablet (25 mcg total) by mouth daily before breakfast. Appointment needed for more refills. 15 tablet 0  . metFORMIN (GLUCOPHAGE) 1000 MG tablet Take 1 tablet every morning then take 1/2 tablet at lunch and at bedtime 60 tablet 0  . metFORMIN (GLUCOPHAGE) 1000 MG  tablet 1 tab po bid and 1/2 tab po q noon 75 tablet 5  . metoprolol tartrate (LOPRESSOR) 25 MG tablet Take 1 tablet (25 mg total) by mouth 2 (two) times daily. 60 tablet 1  . ondansetron (ZOFRAN) 4 MG tablet Take 1 tablet (4 mg total) by mouth every 8 (eight) hours as needed for nausea or vomiting. 20 tablet 0  . ONE TOUCH ULTRA TEST test strip USE ONCE DAILY TO CHECK BLOOD SUGAR. DX E11.9  2  . ONE TOUCH ULTRA TEST test strip USE ONCE DAILY TO CHECK BLOOD SUGAR. DX E11.9 100 each 1  . Potassium Chloride ER 20 MEQ TBCR Take 20 mEq by mouth daily. 7 tablet 0  . tizanidine (ZANAFLEX) 2 MG capsule Take 1 capsule (2 mg total) by mouth 2 (two) times daily as needed for muscle spasms. 30  capsule 0   No current facility-administered medications on file prior to visit.     Allergies  Allergen Reactions  . Lyrica [Pregabalin] Other (See Comments)    MADE PATIENT VERY EMOTIONAL AND WOULD CRY EASILY  . Morphine And Related Nausea And Vomiting  . Tramadol Nausea And Vomiting    Pt cant tolerate this pain med.     Procedure:   OPERATIVE REPORT  DATE OF PROCEDURE: 06/28/2018  PREOPERATIVE DIAGNOSIS: Severe 3-vessel coronary disease.  POSTOPERATIVE DIAGNOSIS: Severe 3-vessel coronary disease.  PROCEDURES: Median sternotomy, extracorporeal circulation Coronary artery bypass grafting x 4 Left internal mammary artery to left anterior descending, Saphenous vein graft to first diagonal and an anterolateral 1, Left radial artery to OM1 Endoscopic vein harvest right thigh  SURGEON: Modesto Charon, MD  ASSISTANT: Jadene Pierini, [place]: Nicholes Rough, PA-C.  ANESTHESIA: General.   Last menstrual period 02/12/2011.  Subjective   Objective  Cor:  Pulm: Abd: Wound: Ext:  Assessment & Plan  With [name] is a 52 year old female patient who underwent coronary bypass grafting with Dr. Roxan Hockey on 06/28/2018.  She returns to our office today for a left radial harvest site incision evaluation.  Last visit she was seen by Jadene Pierini, PA-C.  She was prescribed Lasix and told to continue her Keflex.  Her incision at that time did have intermittent areas of necrosis along the skin edges but no definite infection was identified.  Elgie Collard 07/27/2018

## 2018-07-28 DIAGNOSIS — I951 Orthostatic hypotension: Secondary | ICD-10-CM | POA: Diagnosis not present

## 2018-07-28 DIAGNOSIS — I214 Non-ST elevation (NSTEMI) myocardial infarction: Secondary | ICD-10-CM | POA: Diagnosis not present

## 2018-07-28 DIAGNOSIS — E1142 Type 2 diabetes mellitus with diabetic polyneuropathy: Secondary | ICD-10-CM | POA: Diagnosis not present

## 2018-07-28 DIAGNOSIS — M4726 Other spondylosis with radiculopathy, lumbar region: Secondary | ICD-10-CM | POA: Diagnosis not present

## 2018-07-28 DIAGNOSIS — G2581 Restless legs syndrome: Secondary | ICD-10-CM | POA: Diagnosis not present

## 2018-07-28 DIAGNOSIS — M48062 Spinal stenosis, lumbar region with neurogenic claudication: Secondary | ICD-10-CM | POA: Diagnosis not present

## 2018-07-28 DIAGNOSIS — Z48812 Encounter for surgical aftercare following surgery on the circulatory system: Secondary | ICD-10-CM | POA: Diagnosis not present

## 2018-07-28 DIAGNOSIS — I110 Hypertensive heart disease with heart failure: Secondary | ICD-10-CM | POA: Diagnosis not present

## 2018-07-28 DIAGNOSIS — E11319 Type 2 diabetes mellitus with unspecified diabetic retinopathy without macular edema: Secondary | ICD-10-CM | POA: Diagnosis not present

## 2018-07-28 DIAGNOSIS — F329 Major depressive disorder, single episode, unspecified: Secondary | ICD-10-CM | POA: Diagnosis not present

## 2018-07-28 DIAGNOSIS — E1151 Type 2 diabetes mellitus with diabetic peripheral angiopathy without gangrene: Secondary | ICD-10-CM | POA: Diagnosis not present

## 2018-07-28 DIAGNOSIS — I5032 Chronic diastolic (congestive) heart failure: Secondary | ICD-10-CM | POA: Diagnosis not present

## 2018-07-28 DIAGNOSIS — I2510 Atherosclerotic heart disease of native coronary artery without angina pectoris: Secondary | ICD-10-CM | POA: Diagnosis not present

## 2018-07-29 ENCOUNTER — Other Ambulatory Visit: Payer: Self-pay | Admitting: Physician Assistant

## 2018-07-29 ENCOUNTER — Ambulatory Visit: Payer: BLUE CROSS/BLUE SHIELD | Admitting: Adult Health

## 2018-08-01 NOTE — Progress Notes (Deleted)
Cardiology Office Note   Date:  08/01/2018   ID:  [name], DOB 1966/05/12, MRN [id number]  PCP:  Mosie Lukes, MD  Cardiologist: Dr.Acharya  No chief complaint on file.    History of Present Illness: [name] is a 52 y.o. female who presents for ongoing assessment and management of chronic diastolic heart failure, hypertension, hyperlipidemia, with history of CVA in 2014 and 2017, PAD, and type 2 diabetes.  The patient was seen on consultation for chest pain by Dr. [name] on 06/24/2018.  The patient did have elevated troponin and hypertension on evaluation.  Echocardiogram was completed during hospitalization which revealed normal LV systolic function, with anterior and anterior lateral hypokinesis, grade 2 diastolic dysfunction.  There were no significant valvular abnormalities.  Secondary to multiple cardiovascular risk factors and recurrent chest discomfort, the patient subsequently had cardiac catheterization on 06/24/2018 by Dr. Claiborne Billings.  She was found to have severe multivessel CAD with 80% distal left main stenosis, calcification of the proximal LAD with diffuse 85% to 90% stenosis before the first diagonal vessel, 80% stenosis between the first and second diagonal vessel, with 95% ostial stenosis of the second diagonal and LAD beyond the diagonal vessel.  The patient was also noted to have a subtotally occluded distal LAD after third diagonal was faint filling antegrade; 60% proximal left circumflex stenosis with 6% distal circumflex marginal stenosis, large dominant RCA with 20% tandem and mid RCA stenosis, and PDA stenosis of 40% ostially 50% in the midsegment and 80% distally.  The patient was referred to CVTS for surgical consultation for CABG revascularization surgery.  She was started on a high potency statin therapy.  The patient ultimately had CABG x4 on 06/28/2018 by Dr. Erasmo Leventhal, utilizing LIMA to LAD, SVG to diagonal 1, and ramus intermediate and  radial artery to OM.  She had open radial harvest of her left arm and endoscopic harvesting of the greater saphenous vein from her right thigh.  She did have low-dose Lasix due to mild hypervolemia.  She also underwent inpatient physical therapy postoperatively.  She did have an elevated creatinine of 1.35 she was not started on ACE inhibitor which she was taking at home, but started on amlodipine for better blood pressure control.  She was discharged on 07/02/2018.  She followed up with Jadene Pierini, PA, with CVTS postoperatively on 07/19/2018 due to slow healing radial artery harvest site.  She had no evidence of cellulitis or purulence but there was some induration bordering the incision.  It was felt that it was more of a suture reaction or nonhealing as there was some evidence of necrotic skin edges as well as on her other incisions.  She was started on a 10-day course of Keflex until finished.  She has had advanced home health care nurses seeing her weekly.  Past Medical History:  Diagnosis Date  . Acute on chronic diastolic CHF (congestive heart failure) ([place]) 06/24/2018  . Anxiety   . COMMON MIGRAINE 10/07/2010  . Decreased visual acuity 11/10/2016  . Depression 12/18/2012  . Diabetes mellitus type II, uncontrolled ([place]) 10/07/2010   Qualifier: Diagnosis of  By: Charlett Blake MD, [name]    . Diabetic foot infection ([place]) 08/26/2016  . Disturbance of skin sensation 10/07/2010  . Heart murmur   . History of kidney stones    "years ago"  . Hyperlipidemia 12/06/2010  . Hypertension   . Lipoma of abdominal wall 10/05/2016  . Overweight(278.02) 12/06/2010  . PERIPHERAL NEUROPATHY, FEET 10/07/2010  .  PVD (peripheral vascular disease) ([place]) 01/21/2012  . RESTLESS LEG SYNDROME 10/25/2010  . Stroke [facility]) 2014, 2017   most recently in 2/17 - intracerebral hemorrhage    Past Surgical History:  Procedure Laterality Date  . AMPUTATION TOE Left 09/09/2016   Procedure: AMPUTATION OF LEFT GREAT TOE;  Surgeon: Milly Jakob, MD;  Location: [place];  Service: Orthopedics;  Laterality: Left;  . CORONARY ARTERY BYPASS GRAFT N/A 06/28/2018   Procedure: CORONARY ARTERY BYPASS GRAFTING (CABG) times  four, using left internal mammary artery, endoscopically harvested right saphenous vein, and harvested left radial artery;  Surgeon: [name], MD;  Location: [place];  Service: Open Heart Surgery;  Laterality: N/A;  . ENDOVEIN HARVEST OF GREATER SAPHENOUS VEIN Right 06/28/2018   Procedure: ENDOVEIN HARVEST OF GREATER SAPHENOUS VEIN;  Surgeon: [name], MD;  Location: [place];  Service: Open Heart Surgery;  Laterality: Right;  . LEFT HEART CATH AND CORONARY ANGIOGRAPHY N/A 06/24/2018   Procedure: LEFT HEART CATH AND CORONARY ANGIOGRAPHY;  Surgeon: Troy Sine, MD;  Location: [place] [facility];  Service: Cardiovascular;  Laterality: N/A;  . RADIAL ARTERY HARVEST Left 06/28/2018   Procedure: RADIAL ARTERY HARVEST;  Surgeon: [name], MD;  Location: [place];  Service: Open Heart Surgery;  Laterality: Left;  . TEE WITHOUT CARDIOVERSION N/A 06/28/2018   Procedure: TRANSESOPHAGEAL ECHOCARDIOGRAM (TEE);  Surgeon: [name], MD;  Location: [place];  Service: Open Heart Surgery;  Laterality: N/A;  . WISDOM TOOTH EXTRACTION       Current Outpatient Medications  Medication Sig Dispense Refill  . acetaminophen (TYLENOL) 325 MG tablet Take 2 tablets (650 mg total) by mouth every 6 (six) hours as needed for mild pain or headache.    Marland Kitchen amLODipine (NORVASC) 5 MG tablet Take 1 tablet (5 mg total) by mouth daily. 30 tablet 1  . aspirin EC 325 MG EC tablet Take 1 tablet (325 mg total) by mouth daily. 30 tablet 0  . atorvastatin (LIPITOR) 80 MG tablet Take 1 tablet (80 mg total) by mouth daily at 6 PM. 30 tablet 1  . azelastine (ASTELIN) 0.1 % nasal spray Place 2 sprays into both nostrils 2 (two) times daily. Use in each nostril as directed (Patient taking differently:  Place 2 sprays into both nostrils 2 (two) times daily as needed for allergies. ) 30 mL 3  . escitalopram (LEXAPRO) 10 MG tablet Take 1 tablet (10 mg total) by mouth daily. 30 tablet 0  . fluticasone (FLONASE) 50 MCG/ACT nasal spray Place 2 sprays into both nostrils daily. (Patient taking differently: Place 2 sprays into both nostrils daily as needed for allergies. ) 16 g 1  . furosemide (LASIX) 40 MG tablet Take 1 tablet (40 mg total) by mouth daily. 7 tablet 0  . gabapentin (NEURONTIN) 100 MG capsule Take 3 capsules (300 mg total) by mouth 3 (three) times daily. 90 capsule 0  . HYDROcodone-acetaminophen (NORCO) 10-325 MG tablet Take one tablet every 4-6 hours PRN severe pain 20 tablet 0  . isosorbide mononitrate (IMDUR) 30 MG 24 hr tablet Take 0.5 tablets (15 mg total) by mouth daily. For one month then stop. 30 tablet 0  . LANTUS SOLOSTAR 100 UNIT/ML Solostar Pen 100 units sq in am and 50 units sq in pm (Patient taking differently: Inject 50-100 Units into the skin See admin instructions. Use 100 units every morning and use 50 units every evening) 45 mL 4  . levothyroxine (SYNTHROID, LEVOTHROID)  25 MCG tablet Take 1 tablet (25 mcg total) by mouth daily before breakfast. Appointment needed for more refills. 15 tablet 0  . metFORMIN (GLUCOPHAGE) 1000 MG tablet Take 1 tablet every morning then take 1/2 tablet at lunch and at bedtime 60 tablet 0  . metFORMIN (GLUCOPHAGE) 1000 MG tablet 1 tab po bid and 1/2 tab po q noon 75 tablet 5  . metoprolol tartrate (LOPRESSOR) 25 MG tablet Take 1 tablet (25 mg total) by mouth 2 (two) times daily. 60 tablet 1  . ondansetron (ZOFRAN) 4 MG tablet Take 1 tablet (4 mg total) by mouth every 8 (eight) hours as needed for nausea or vomiting. 20 tablet 0  . ONE TOUCH ULTRA TEST test strip USE ONCE DAILY TO CHECK BLOOD SUGAR. DX E11.9  2  . ONE TOUCH ULTRA TEST test strip USE ONCE DAILY TO CHECK BLOOD SUGAR. DX E11.9 100 each 1  . Potassium Chloride ER 20 MEQ TBCR Take 20  mEq by mouth daily. 7 tablet 0  . tizanidine (ZANAFLEX) 2 MG capsule Take 1 capsule (2 mg total) by mouth 2 (two) times daily as needed for muscle spasms. 30 capsule 0   No current facility-administered medications for this visit.     Allergies:   Lyrica [pregabalin]; Morphine and related; and Tramadol    Social History:  The patient  reports that she quit smoking about 12 years ago. She smoked 0.50 packs per day. She has never used smokeless tobacco. She reports that she does not drink alcohol or use drugs.   Family History:  The patient's family history includes Alcohol abuse in her brother; Arthritis in her mother; Diabetes in her paternal grandmother; Healthy in her son; Leukemia in her brother; Stroke in her brother.    ROS: All other systems are reviewed and negative. Unless otherwise mentioned in H&P    PHYSICAL EXAM: VS:  LMP 02/12/2011 (Exact Date)  , BMI There is no height or weight on file to calculate BMI. GEN: Well nourished, well developed, in no acute distress HEENT: normal Neck: no JVD, carotid bruits, or masses Cardiac: ***RRR; no murmurs, rubs, or gallops,no edema  Respiratory:  Clear to auscultation bilaterally, normal work of breathing GI: soft, nontender, nondistended, + BS MS: no deformity or atrophy Skin: warm and dry, no rash Neuro:  Strength and sensation are intact Psych: euthymic mood, full affect   EKG:  EKG {ACTION; IS/IS [id number]} ordered today. The ekg ordered today demonstrates ***   Recent Labs: 06/23/2018: B Natriuretic Peptide 335.2 06/29/2018: Magnesium 2.8 07/02/2018: TSH 3.396 07/20/2018: ALT 11; BUN 23; Creatinine, Ser 0.97; Hemoglobin 10.7; Platelets 607.0; Potassium 5.1; Sodium 134    Lipid Panel    Component Value Date/Time   CHOL 200 06/24/2018 1045   TRIG 127 06/24/2018 1045   HDL 36 (L) 06/24/2018 1045   CHOLHDL 5.6 06/24/2018 1045   VLDL 25 06/24/2018 1045   LDLCALC 139 (H) 06/24/2018 1045   LDLDIRECT 133.0  03/02/2017 0838      Wt Readings from Last 3 Encounters:  07/20/18 160 lb (72.6 kg)  07/19/18 159 lb 6.4 oz (72.3 kg)  07/13/18 158 lb (71.7 kg)      Other studies Reviewed: Conclusion     Ost RPDA to RPDA lesion is 40% stenosed.  RPDA-1 lesion is 50% stenosed.  RPDA-2 lesion is 80% stenosed.  Prox RCA lesion is 20% stenosed.  Mid RCA lesion is 20% stenosed.  Mid LM to Dist LM lesion is 80% stenosed.  Dist LM to Prox LAD lesion is 85% stenosed.  Prox LAD to Mid LAD lesion is 80% stenosed.  Ost 2nd Diag to 2nd Diag lesion is 95% stenosed.  Mid LAD lesion is 95% stenosed.  Dist LAD lesion is 99% stenosed.  Prox Cx to Mid Cx lesion is 60% stenosed.  LV end diastolic pressure is severely elevated.  There is moderate left ventricular systolic dysfunction.   Severe multivessel CAD with 80% distal left main stenosis, calcification of the proximal LAD with diffuse 85 to 90% stenosis before the first diagonal vessel, 80% stenoses between the first and second diagonal vessel with 95% ostial stenosis of the second diagonal and LAD beyond the diagonal vessel; the LAD is subtotally occluded in the distal LAD after the third diagonal vessel with faint filling antegrade; 60% proximal left circumflex stenosis with 50% distal circumflex marginal stenosis; large dominant RCA with 20% tandem mid RCA stenoses, and PDA stenosis of 40% ostially 50% in the mid segment and 80% distally.  Moderate LV dysfunction with an ejection fraction of 40 to 45% with hypocontractility involving the mid distal anterolateral wall extending to the apex.  LVEDP is elevated at 29 mm.  RECOMMENDATION: Surgical consultation for CABG revascularization surgery.  Increase anti-ischemic medical therapy. She will be started on high potency statin therapy.  With high-grade disease consider heparinization but must follow hemoglobin closely with recent decreased hemoglobin.     ASSESSMENT AND PLAN:  1.   ***   Current medicines are reviewed at length with the patient today.    Labs/ tests ordered today include: *** Phill Myron. [place] Pugh, ANP, AACC   08/01/2018 4:23 PM    [place] [address] Office [phone] Fax [phone]

## 2018-08-03 ENCOUNTER — Ambulatory Visit: Payer: BLUE CROSS/BLUE SHIELD | Admitting: Thoracic Surgery (Cardiothoracic Vascular Surgery)

## 2018-08-03 ENCOUNTER — Ambulatory Visit (INDEPENDENT_AMBULATORY_CARE_PROVIDER_SITE_OTHER): Payer: Self-pay | Admitting: Thoracic Surgery (Cardiothoracic Vascular Surgery)

## 2018-08-03 ENCOUNTER — Ambulatory Visit: Payer: BLUE CROSS/BLUE SHIELD | Admitting: Adult Health

## 2018-08-03 VITALS — BP 180/100 | HR 100 | Resp 20 | Ht 62.0 in | Wt 152.0 lb

## 2018-08-03 DIAGNOSIS — I2510 Atherosclerotic heart disease of native coronary artery without angina pectoris: Secondary | ICD-10-CM

## 2018-08-03 DIAGNOSIS — Z951 Presence of aortocoronary bypass graft: Secondary | ICD-10-CM

## 2018-08-03 NOTE — Progress Notes (Signed)
[address]       [place] [address]             [phone]     HPI: [name] returns for follow-up  Kayna Suppa is a 52 year old woman with a comp gated past medical history including hypertension, hyperlipidemia, type 2 insulin-dependent diabetes with multiple vascular complications, stroke x2, peripheral neuropathy, depression, and chronic pain.  She presented with a non-ST elevation MI.  She also was in congestive failure.  She was found to have left main and three-vessel disease at catheterization.  She underwent coronary bypass grafting x4 on 06/28/2018.  She had poor target vessels except for the LAD.  She was discharged on postoperative day #8.  She was seen back in the office couple weeks later with a wound infection of her radial artery harvest site.  With Keflex.  She also was started on Lasix.  She feels well.  She does have some soreness in her chest but is not taking any narcotics for that.  She has not had any recurrent angina.  She is anxious to resume her normal activities.  Past Medical History:  Diagnosis Date  . Acute on chronic diastolic CHF (congestive heart failure) ([place]) 06/24/2018  . Anxiety   . COMMON MIGRAINE 10/07/2010  . Decreased visual acuity 11/10/2016  . Depression 12/18/2012  . Diabetes mellitus type II, uncontrolled ([place]) 10/07/2010   Qualifier: Diagnosis of  By: Charlett Blake MD, [name]    . Diabetic foot infection ([place]) 08/26/2016  . Disturbance of skin sensation 10/07/2010  . Heart murmur   . History of kidney stones    "years ago"  . Hyperlipidemia 12/06/2010  . Hypertension   . Lipoma of abdominal wall 10/05/2016  . Overweight(278.02) 12/06/2010  . PERIPHERAL NEUROPATHY, FEET 10/07/2010  . PVD (peripheral vascular disease) ([place]) 01/21/2012  . RESTLESS LEG SYNDROME 10/25/2010  . Stroke [facility]) 2014, 2017   most recently in 2/17 - intracerebral hemorrhage    Current Outpatient Medications  Medication Sig Dispense Refill  .  acetaminophen (TYLENOL) 325 MG tablet Take 2 tablets (650 mg total) by mouth every 6 (six) hours as needed for mild pain or headache.    Marland Kitchen amLODipine (NORVASC) 5 MG tablet Take 1 tablet (5 mg total) by mouth daily. 30 tablet 1  . aspirin EC 325 MG EC tablet Take 1 tablet (325 mg total) by mouth daily. 30 tablet 0  . atorvastatin (LIPITOR) 80 MG tablet Take 1 tablet (80 mg total) by mouth daily at 6 PM. 30 tablet 1  . azelastine (ASTELIN) 0.1 % nasal spray Place 2 sprays into both nostrils 2 (two) times daily. Use in each nostril as directed (Patient taking differently: Place 2 sprays into both nostrils 2 (two) times daily as needed for allergies. ) 30 mL 3  . escitalopram (LEXAPRO) 10 MG tablet Take 1 tablet (10 mg total) by mouth daily. 30 tablet 0  . fluticasone (FLONASE) 50 MCG/ACT nasal spray Place 2 sprays into both nostrils daily. (Patient taking differently: Place 2 sprays into both nostrils daily as needed for allergies. ) 16 g 1  . furosemide (LASIX) 40 MG tablet Take 1 tablet (40 mg total) by mouth daily. 7 tablet 0  . gabapentin (NEURONTIN) 100 MG capsule Take 3 capsules (300 mg total) by mouth 3 (three) times daily. 90 capsule 0  . HYDROcodone-acetaminophen (NORCO) 10-325 MG tablet Take one tablet every 4-6 hours PRN severe pain 20 tablet 0  .  isosorbide mononitrate (IMDUR) 30 MG 24 hr tablet Take 0.5 tablets (15 mg total) by mouth daily. For one month then stop. 30 tablet 0  . LANTUS SOLOSTAR 100 UNIT/ML Solostar Pen 100 units sq in am and 50 units sq in pm (Patient taking differently: Inject 50-100 Units into the skin See admin instructions. Use 100 units every morning and use 50 units every evening) 45 mL 4  . levothyroxine (SYNTHROID, LEVOTHROID) 25 MCG tablet Take 1 tablet (25 mcg total) by mouth daily before breakfast. Appointment needed for more refills. 15 tablet 0  . metFORMIN (GLUCOPHAGE) 1000 MG tablet Take 1 tablet every morning then take 1/2 tablet at lunch and at bedtime 60  tablet 0  . metFORMIN (GLUCOPHAGE) 1000 MG tablet 1 tab po bid and 1/2 tab po q noon 75 tablet 5  . metoprolol tartrate (LOPRESSOR) 25 MG tablet Take 1 tablet (25 mg total) by mouth 2 (two) times daily. 60 tablet 1  . ondansetron (ZOFRAN) 4 MG tablet Take 1 tablet (4 mg total) by mouth every 8 (eight) hours as needed for nausea or vomiting. 20 tablet 0  . ONE TOUCH ULTRA TEST test strip USE ONCE DAILY TO CHECK BLOOD SUGAR. DX E11.9  2  . ONE TOUCH ULTRA TEST test strip USE ONCE DAILY TO CHECK BLOOD SUGAR. DX E11.9 100 each 1  . Potassium Chloride ER 20 MEQ TBCR Take 20 mEq by mouth daily. 7 tablet 0  . tizanidine (ZANAFLEX) 2 MG capsule Take 1 capsule (2 mg total) by mouth 2 (two) times daily as needed for muscle spasms. 30 capsule 0   No current facility-administered medications for this visit.     Physical Exam BP (!) 180/100   Pulse 100   Resp 20   Ht 5\' 2"  (1.575 m)   Wt 152 lb (68.9 kg)   LMP 02/12/2011 (Exact Date)   SpO2 96% Comment: RA  BMI 27.54 kg/m  52 year old woman in no acute distress Alert and oriented x3 with no focal deficits Lungs clear with equal breath sounds bilaterally Cardiac regular rate and rhythm normal S1 and S2 Sternum stable, incision clean dry and intact Radial artery harvest site with eschar, no evidence of infection Leg incision healing well, no peripheral edema  Diagnostic Tests: Chest x-ray from 07/13/2018 reviewed shows good aeration of both lungs with no effusions or infiltrates  Impression: [name] is a 52 year old woman with a history of hypertension, hyperlipidemia, severe type 2 insulin-dependent diabetes with multiple vascular complications including stroke, PAD, and CAD.  She underwent coronary artery bypass grafting x4 after presenting with a non-ST elevation MI and with congestive heart failure.  She is doing well currently.  She has not had any recurrent angina.  She is off Lasix and does not have any signs or symptoms of heart  failure.  She may begin driving.  Appropriate precautions were discussed.  She should not lift anything over 10 pounds for another week and anything over 20 pounds for 3 more weeks.  Beyond that her activities are unrestricted.  She will call cardiac rehab and go ahead and schedule to start that.  She has not yet seen cardiology.  She will call and schedule an appointment with them as well.  Hypertension-blood pressure elevated.  She has a cuff at home she will check that.  If her blood pressure remains elevated she will contact cardiology. Plan: Follow-up with cardiology Begin cardiac rehab Return in 3 weeks for wound check a radial incision  [name], MD [facility] [phone]

## 2018-08-11 ENCOUNTER — Other Ambulatory Visit: Payer: Self-pay | Admitting: Family Medicine

## 2018-08-11 DIAGNOSIS — F329 Major depressive disorder, single episode, unspecified: Secondary | ICD-10-CM

## 2018-08-11 DIAGNOSIS — F32A Depression, unspecified: Secondary | ICD-10-CM

## 2018-08-11 DIAGNOSIS — E1159 Type 2 diabetes mellitus with other circulatory complications: Secondary | ICD-10-CM

## 2018-08-13 ENCOUNTER — Telehealth (HOSPITAL_COMMUNITY): Payer: Self-pay

## 2018-08-13 NOTE — Telephone Encounter (Signed)
Attempted to call patient in regards to Cardiac Rehab - LM on VM 

## 2018-08-24 ENCOUNTER — Ambulatory Visit (INDEPENDENT_AMBULATORY_CARE_PROVIDER_SITE_OTHER): Payer: Self-pay | Admitting: Thoracic Surgery (Cardiothoracic Vascular Surgery)

## 2018-08-24 VITALS — BP 140/62 | HR 96 | Temp 97.6°F | Resp 20 | Ht 62.0 in | Wt 153.0 lb

## 2018-08-24 DIAGNOSIS — I2510 Atherosclerotic heart disease of native coronary artery without angina pectoris: Secondary | ICD-10-CM

## 2018-08-24 DIAGNOSIS — Z951 Presence of aortocoronary bypass graft: Secondary | ICD-10-CM

## 2018-08-24 NOTE — Progress Notes (Signed)
[address]       [place] [address]             [phone]       HPI: Ms. Theisen returns for a scheduled follow-up visit  [name] is a 52 year old woman with a complicated medical history including hypertension, hyperlipidemia, type 2 insulin-dependent diabetes with multiple vascular complications, stroke x2, peripheral neuropathy, depression, and chronic pain.  She presented back in October with a non-ST elevation MI.  She had left main and three-vessel coronary disease.  We did coronary bypass grafting x4 on 06/28/2018.  She went home on day 8.  She developed wound infection of her radial artery incision.  That was treated with Keflex.  I saw her in the office on 08/03/2018.  She was feeling well at that time and hoping to increase her activities.  The infection had pretty much resolved she did have eschar on her wrist and leg incisions.  She says she has not felt well over the past few days.  She feels tired and sore all over.  She is having more pain at all of her incisions.  She says that her son and other family members who live in her house all have the flu.  Past Medical History:  Diagnosis Date  . Acute on chronic diastolic CHF (congestive heart failure) ([place]) 06/24/2018  . Anxiety   . COMMON MIGRAINE 10/07/2010  . Decreased visual acuity 11/10/2016  . Depression 12/18/2012  . Diabetes mellitus type II, uncontrolled ([place]) 10/07/2010   Qualifier: Diagnosis of  By: Charlett Blake MD, [name]    . Diabetic foot infection ([place]) 08/26/2016  . Disturbance of skin sensation 10/07/2010  . Heart murmur   . History of kidney stones    "years ago"  . Hyperlipidemia 12/06/2010  . Hypertension   . Lipoma of abdominal wall 10/05/2016  . Overweight(278.02) 12/06/2010  . PERIPHERAL NEUROPATHY, FEET 10/07/2010  . PVD (peripheral vascular disease) ([place]) 01/21/2012  . RESTLESS LEG SYNDROME 10/25/2010  . Stroke [facility]) 2014, 2017   most recently in 2/17 - intracerebral  hemorrhage    Current Outpatient Medications  Medication Sig Dispense Refill  . acetaminophen (TYLENOL) 325 MG tablet Take 2 tablets (650 mg total) by mouth every 6 (six) hours as needed for mild pain or headache.    Marland Kitchen amLODipine (NORVASC) 5 MG tablet Take 1 tablet (5 mg total) by mouth daily. 30 tablet 1  . aspirin EC 325 MG EC tablet Take 1 tablet (325 mg total) by mouth daily. 30 tablet 0  . atorvastatin (LIPITOR) 80 MG tablet Take 1 tablet (80 mg total) by mouth daily at 6 PM. 30 tablet 1  . azelastine (ASTELIN) 0.1 % nasal spray Place 2 sprays into both nostrils 2 (two) times daily. Use in each nostril as directed (Patient taking differently: Place 2 sprays into both nostrils 2 (two) times daily as needed for allergies. ) 30 mL 3  . escitalopram (LEXAPRO) 10 MG tablet TAKE 1 TABLET BY MOUTH EVERY DAY 30 tablet 0  . fluticasone (FLONASE) 50 MCG/ACT nasal spray Place 2 sprays into both nostrils daily. (Patient taking differently: Place 2 sprays into both nostrils daily as needed for allergies. ) 16 g 1  . furosemide (LASIX) 40 MG tablet Take 1 tablet (40 mg total) by mouth daily. 7 tablet 0  . gabapentin (NEURONTIN) 100 MG capsule Take 3 capsules (300 mg total) by mouth 3 (three) times daily. 90 capsule 0  .  HYDROcodone-acetaminophen (NORCO) 10-325 MG tablet Take one tablet every 4-6 hours PRN severe pain 20 tablet 0  . isosorbide mononitrate (IMDUR) 30 MG 24 hr tablet Take 0.5 tablets (15 mg total) by mouth daily. For one month then stop. 30 tablet 0  . LANTUS SOLOSTAR 100 UNIT/ML Solostar Pen 100 units sq in am and 50 units sq in pm (Patient taking differently: Inject 50-100 Units into the skin See admin instructions. Use 100 units every morning and use 50 units every evening) 45 mL 4  . levothyroxine (SYNTHROID, LEVOTHROID) 25 MCG tablet Take 1 tablet (25 mcg total) by mouth daily before breakfast. Appointment needed for more refills. 15 tablet 0  . metFORMIN (GLUCOPHAGE) 1000 MG tablet 1 tab  po bid and 1/2 tab po q noon 75 tablet 5  . metFORMIN (GLUCOPHAGE) 1000 MG tablet TAKE 1 TABLET EVERY MORNING THEN TAKE 1/2 TABLET AT LUNCH AND AT BEDTIME 60 tablet 0  . metoprolol tartrate (LOPRESSOR) 25 MG tablet Take 1 tablet (25 mg total) by mouth 2 (two) times daily. 60 tablet 1  . ondansetron (ZOFRAN) 4 MG tablet Take 1 tablet (4 mg total) by mouth every 8 (eight) hours as needed for nausea or vomiting. 20 tablet 0  . ONE TOUCH ULTRA TEST test strip USE ONCE DAILY TO CHECK BLOOD SUGAR. DX E11.9  2  . ONE TOUCH ULTRA TEST test strip USE ONCE DAILY TO CHECK BLOOD SUGAR. DX E11.9 100 each 1  . Potassium Chloride ER 20 MEQ TBCR Take 20 mEq by mouth daily. 7 tablet 0  . tizanidine (ZANAFLEX) 2 MG capsule Take 1 capsule (2 mg total) by mouth 2 (two) times daily as needed for muscle spasms. 30 capsule 0   No current facility-administered medications for this visit.     Physical Exam BP 140/62   Pulse 96   Temp 97.6 F (36.4 C) (Oral)   Resp 20   Ht 5\' 2"  (1.575 m)   Wt 153 lb (69.4 kg)   LMP 02/12/2011 (Exact Date)   SpO2 99% Comment: RA  BMI 27.68 kg/m  52 year old woman in no acute distress Alert and oriented x3 Lungs clear Cardiac regular rate and rhythm Sternum stable, incision clean dry and intact Radial incision eschar distally, right leg incision with eschar.  No erythema or drainage  Impression: [name] is a 52 year old woman with severe type 2 diabetes that is insulin-dependent with diabetic neuropathy and multiple vascular complications.  She presented with a non-ST elevation MI and was found to have left main and three-vessel disease.  I did coronary bypass grafting on 06/28/2018.  At the time of surgery she had very poor target vessels with the exception of her LAD.  She developed a wound infection of her left radial incision that was treated with Keflex.  That has resolved.  She does have eschar formation at her left wrist incision and on the leg incision.  There  is no evidence of active infection.  She will return in 1 month to check on her progress and see if she is ready to return to work at that point  Plan: Return in 1 month  [name], MD [facility] [phone]

## 2018-08-24 NOTE — Telephone Encounter (Signed)
Called pt to see if she would like to proceed with scheduling for CR, pt stated not at this time.  Closed referral

## 2018-08-25 ENCOUNTER — Other Ambulatory Visit: Payer: Self-pay

## 2018-08-25 ENCOUNTER — Ambulatory Visit (INDEPENDENT_AMBULATORY_CARE_PROVIDER_SITE_OTHER): Payer: BLUE CROSS/BLUE SHIELD | Admitting: Physician Assistant

## 2018-08-25 ENCOUNTER — Encounter: Payer: Self-pay | Admitting: Physician Assistant

## 2018-08-25 VITALS — BP 122/86 | HR 105 | Temp 98.1°F | Resp 14 | Ht 62.0 in | Wt 151.0 lb

## 2018-08-25 DIAGNOSIS — H6981 Other specified disorders of Eustachian tube, right ear: Secondary | ICD-10-CM | POA: Diagnosis not present

## 2018-08-25 MED ORDER — ONETOUCH ULTRA BLUE VI STRP: ORAL_STRIP | 2 refills | Status: DC

## 2018-08-25 MED ORDER — ONETOUCH ULTRA 2 W/DEVICE KIT: PACK | 0 refills | Status: DC

## 2018-08-25 MED ORDER — AZELASTINE HCL 0.1 % NA SOLN: 2.0000 | Freq: Two times a day (BID) | NASAL | 3 refills | Status: DC

## 2018-08-25 MED ORDER — FLUTICASONE PROPIONATE 50 MCG/ACT NA SUSP: 2.0000 | Freq: Every day | NASAL | 1 refills | Status: DC

## 2018-08-25 NOTE — Progress Notes (Signed)
Patient presents to clinic today c/o 3 days of R ear pressure and occasional throbbing pain. Denies popping or crackling, tinnitus or change in hearing. Denies fever, chills, URI symptoms or symptoms of L ear.   Past Medical History:  Diagnosis Date  . Acute on chronic diastolic CHF (congestive heart failure) ([place]) 06/24/2018  . Anxiety   . COMMON MIGRAINE 10/07/2010  . Decreased visual acuity 11/10/2016  . Depression 12/18/2012  . Diabetes mellitus type II, uncontrolled ([place]) 10/07/2010   Qualifier: Diagnosis of  By: Charlett Blake MD, [name]    . Diabetic foot infection ([place]) 08/26/2016  . Disturbance of skin sensation 10/07/2010  . Heart murmur   . History of kidney stones    "years ago"  . Hyperlipidemia 12/06/2010  . Hypertension   . Lipoma of abdominal wall 10/05/2016  . Overweight(278.02) 12/06/2010  . PERIPHERAL NEUROPATHY, FEET 10/07/2010  . PVD (peripheral vascular disease) ([place]) 01/21/2012  . RESTLESS LEG SYNDROME 10/25/2010  . Stroke [facility]) 2014, 2017   most recently in 2/17 - intracerebral hemorrhage    Current Outpatient Medications on File Prior to Visit  Medication Sig Dispense Refill  . acetaminophen (TYLENOL) 325 MG tablet Take 2 tablets (650 mg total) by mouth every 6 (six) hours as needed for mild pain or headache.    Marland Kitchen amLODipine (NORVASC) 5 MG tablet Take 1 tablet (5 mg total) by mouth daily. 30 tablet 1  . aspirin EC 325 MG EC tablet Take 1 tablet (325 mg total) by mouth daily. 30 tablet 0  . atorvastatin (LIPITOR) 80 MG tablet Take 1 tablet (80 mg total) by mouth daily at 6 PM. 30 tablet 1  . azelastine (ASTELIN) 0.1 % nasal spray Place 2 sprays into both nostrils 2 (two) times daily. Use in each nostril as directed (Patient taking differently: Place 2 sprays into both nostrils 2 (two) times daily as needed for allergies. ) 30 mL 3  . escitalopram (LEXAPRO) 10 MG tablet TAKE 1 TABLET BY MOUTH EVERY DAY 30 tablet 0  . fluticasone (FLONASE) 50 MCG/ACT nasal spray Place 2 sprays  into both nostrils daily. (Patient taking differently: Place 2 sprays into both nostrils daily as needed for allergies. ) 16 g 1  . furosemide (LASIX) 40 MG tablet Take 1 tablet (40 mg total) by mouth daily. 7 tablet 0  . gabapentin (NEURONTIN) 100 MG capsule Take 3 capsules (300 mg total) by mouth 3 (three) times daily. 90 capsule 0  . HYDROcodone-acetaminophen (NORCO) 10-325 MG tablet Take one tablet every 4-6 hours PRN severe pain 20 tablet 0  . isosorbide mononitrate (IMDUR) 30 MG 24 hr tablet Take 0.5 tablets (15 mg total) by mouth daily. For one month then stop. 30 tablet 0  . LANTUS SOLOSTAR 100 UNIT/ML Solostar Pen 100 units sq in am and 50 units sq in pm (Patient taking differently: Inject 50-100 Units into the skin See admin instructions. Use 100 units every morning and use 50 units every evening) 45 mL 4  . levothyroxine (SYNTHROID, LEVOTHROID) 25 MCG tablet Take 1 tablet (25 mcg total) by mouth daily before breakfast. Appointment needed for more refills. 15 tablet 0  . metFORMIN (GLUCOPHAGE) 1000 MG tablet 1 tab po bid and 1/2 tab po q noon 75 tablet 5  . metoprolol tartrate (LOPRESSOR) 25 MG tablet Take 1 tablet (25 mg total) by mouth 2 (two) times daily. 60 tablet 1  . ondansetron (ZOFRAN) 4 MG tablet Take 1 tablet (4 mg total) by mouth every 8 (  eight) hours as needed for nausea or vomiting. 20 tablet 0  . Potassium Chloride ER 20 MEQ TBCR Take 20 mEq by mouth daily. 7 tablet 0  . tizanidine (ZANAFLEX) 2 MG capsule Take 1 capsule (2 mg total) by mouth 2 (two) times daily as needed for muscle spasms. 30 capsule 0  . ONE TOUCH ULTRA TEST test strip USE ONCE DAILY TO CHECK BLOOD SUGAR. DX E11.9  2   No current facility-administered medications on file prior to visit.     Allergies  Allergen Reactions  . Lyrica [Pregabalin] Other (See Comments)    MADE PATIENT VERY EMOTIONAL AND WOULD CRY EASILY  . Morphine And Related Nausea And Vomiting  . Tramadol Nausea And Vomiting    Pt cant  tolerate this pain med.     Family History  Problem Relation Age of Onset  . Arthritis Mother   . Stroke Brother        previous smoker  . Alcohol abuse Brother        in remission  . Leukemia Brother   . Diabetes Paternal Grandmother   . Healthy Son     Social History   Socioeconomic History  . Marital status: Single    Spouse name: Not on file  . Number of children: Not on file  . Years of education: Not on file  . Highest education level: Not on file  Occupational History  . Occupation: Glass blower/designer  Social Needs  . Financial resource strain: Not on file  . Food insecurity:    Worry: Not on file    Inability: Not on file  . Transportation needs:    Medical: Not on file    Non-medical: Not on file  Tobacco Use  . Smoking status: Former Smoker    Packs/day: 0.50    Last attempt to quit: 09/08/2005    Years since quitting: 12.9  . Smokeless tobacco: Never Used  Substance and Sexual Activity  . Alcohol use: No    Alcohol/week: 0.0 standard drinks  . Drug use: No  . Sexual activity: Yes    Partners: Male    Birth control/protection: None  Lifestyle  . Physical activity:    Days per week: Not on file    Minutes per session: Not on file  . Stress: Not on file  Relationships  . Social connections:    Talks on phone: Not on file    Gets together: Not on file    Attends religious service: Not on file    Active member of club or organization: Not on file    Attends meetings of clubs or organizations: Not on file    Relationship status: Not on file  Other Topics Concern  . Not on file  Social History Narrative   Lives with husband in a two story home.  Has one child.     Works as a Glass blower/designer.     Education: high school.   Review of Systems - See HPI.  All other ROS are negative.  BP 122/86   Pulse (!) 105   Temp 98.1 F (36.7 C) (Oral)   Resp 14   Ht 5\' 2"  (1.575 m)   Wt 151 lb (68.5 kg)   LMP 02/12/2011 (Exact Date)   SpO2 99%   BMI 27.62  kg/m   Physical Exam Vitals signs and nursing note reviewed.  Constitutional:      Appearance: Normal appearance.  HENT:     Head: Normocephalic and atraumatic.  Right Ear: A middle ear effusion (serous) is present.     Left Ear: Tympanic membrane normal.     Mouth/Throat:     Mouth: Mucous membranes are moist.  Eyes:     Conjunctiva/sclera: Conjunctivae normal.  Neck:     Musculoskeletal: Neck supple.  Cardiovascular:     Rate and Rhythm: Normal rate and regular rhythm.     Pulses: Normal pulses.  Pulmonary:     Effort: Pulmonary effort is normal.     Breath sounds: Normal breath sounds.  Lymphadenopathy:     Cervical: No cervical adenopathy.  Neurological:     Mental Status: She is alert.      Assessment/Plan: 1. Dysfunction of right eustachian tube Mild symptoms. No sign of infection. + history of ETD. Not taking her nasal sprays. Restart Flonase and Astelin. Supportive measures reviewed. Follow-up if not improving.  - azelastine (ASTELIN) 0.1 % nasal spray; Place 2 sprays into both nostrils 2 (two) times daily. Use in each nostril as directed  Dispense: 30 mL; Refill: 3 - fluticasone (FLONASE) 50 MCG/ACT nasal spray; Place 2 sprays into both nostrils daily.  Dispense: 16 g; Refill: [place], PA-C

## 2018-08-25 NOTE — Patient Instructions (Signed)
Please keep well-hydrated and get plenty of rest. Restart your nasal allergy sprays as directed.  I have sent in refills. This should help with symptoms.   I have also sent in a glucometer and strips for you.  Please follow-up with Dr. Charlett Blake as scheduled.

## 2018-08-26 ENCOUNTER — Other Ambulatory Visit: Payer: Self-pay | Admitting: Family Medicine

## 2018-09-03 ENCOUNTER — Ambulatory Visit (INDEPENDENT_AMBULATORY_CARE_PROVIDER_SITE_OTHER): Payer: BLUE CROSS/BLUE SHIELD | Admitting: Physician Assistant

## 2018-09-03 ENCOUNTER — Other Ambulatory Visit: Payer: Self-pay

## 2018-09-03 ENCOUNTER — Encounter: Payer: Self-pay | Admitting: Physician Assistant

## 2018-09-03 VITALS — BP 132/78 | HR 90 | Temp 98.4°F | Resp 16 | Ht 62.0 in | Wt 149.0 lb

## 2018-09-03 DIAGNOSIS — J019 Acute sinusitis, unspecified: Secondary | ICD-10-CM | POA: Diagnosis not present

## 2018-09-03 DIAGNOSIS — B9689 Other specified bacterial agents as the cause of diseases classified elsewhere: Secondary | ICD-10-CM | POA: Diagnosis not present

## 2018-09-03 MED ORDER — PEN NEEDLES 31G X 5 MM MISC: 1.0000 | Freq: Every day | 1 refills | Status: DC

## 2018-09-03 MED ORDER — BENZONATATE 100 MG PO CAPS: 100.0000 mg | ORAL_CAPSULE | Freq: Three times a day (TID) | ORAL | 0 refills | Status: DC | PRN

## 2018-09-03 MED ORDER — AMOXICILLIN-POT CLAVULANATE 875-125 MG PO TABS: 1.0000 | ORAL_TABLET | Freq: Two times a day (BID) | ORAL | 0 refills | Status: DC

## 2018-09-03 NOTE — Progress Notes (Signed)
Patient presents to clinic today c/o continued ear pressure of R ear despite treatment for ETD. Notes pressure has turned into ear pain, also with significant nasal congestion and R sided facial and tooth pain. Notes headache. Denies fever. Notes cough that is productive sometimes of clear sputum. Has been using nasal sprays as directed.   Past Medical History:  Diagnosis Date  . Acute on chronic diastolic CHF (congestive heart failure) ([place]) 06/24/2018  . Anxiety   . COMMON MIGRAINE 10/07/2010  . Decreased visual acuity 11/10/2016  . Depression 12/18/2012  . Diabetes mellitus type II, uncontrolled ([place]) 10/07/2010   Qualifier: Diagnosis of  By: Charlett Blake MD, [name]    . Diabetic foot infection ([place]) 08/26/2016  . Disturbance of skin sensation 10/07/2010  . Heart murmur   . History of kidney stones    "years ago"  . Hyperlipidemia 12/06/2010  . Hypertension   . Lipoma of abdominal wall 10/05/2016  . Overweight(278.02) 12/06/2010  . PERIPHERAL NEUROPATHY, FEET 10/07/2010  . PVD (peripheral vascular disease) ([place]) 01/21/2012  . RESTLESS LEG SYNDROME 10/25/2010  . Stroke [facility]) 2014, 2017   most recently in 2/17 - intracerebral hemorrhage    Current Outpatient Medications on File Prior to Visit  Medication Sig Dispense Refill  . acetaminophen (TYLENOL) 325 MG tablet Take 2 tablets (650 mg total) by mouth every 6 (six) hours as needed for mild pain or headache.    Marland Kitchen amLODipine (NORVASC) 5 MG tablet Take 1 tablet (5 mg total) by mouth daily. 30 tablet 1  . aspirin EC 325 MG EC tablet Take 1 tablet (325 mg total) by mouth daily. 30 tablet 0  . atorvastatin (LIPITOR) 80 MG tablet Take 1 tablet (80 mg total) by mouth daily at 6 PM. 30 tablet 1  . azelastine (ASTELIN) 0.1 % nasal spray Place 2 sprays into both nostrils 2 (two) times daily. Use in each nostril as directed 30 mL 3  . Blood Glucose Monitoring Suppl (ONE TOUCH ULTRA 2) w/Device KIT Check blood sugars twice daily. Dx:E11.59 1 each 0  .  escitalopram (LEXAPRO) 10 MG tablet TAKE 1 TABLET BY MOUTH EVERY DAY 30 tablet 0  . fluticasone (FLONASE) 50 MCG/ACT nasal spray Place 2 sprays into both nostrils daily. 16 g 1  . furosemide (LASIX) 40 MG tablet Take 1 tablet (40 mg total) by mouth daily. 7 tablet 0  . gabapentin (NEURONTIN) 100 MG capsule Take 3 capsules (300 mg total) by mouth 3 (three) times daily. 90 capsule 0  . HYDROcodone-acetaminophen (NORCO) 10-325 MG tablet Take one tablet every 4-6 hours PRN severe pain 20 tablet 0  . isosorbide mononitrate (IMDUR) 30 MG 24 hr tablet Take 0.5 tablets (15 mg total) by mouth daily. For one month then stop. 30 tablet 0  . LANTUS SOLOSTAR 100 UNIT/ML Solostar Pen 100 units sq in am and 50 units sq in pm (Patient taking differently: Inject 50-100 Units into the skin See admin instructions. Use 100 units every morning and use 50 units every evening) 45 mL 4  . levothyroxine (SYNTHROID, LEVOTHROID) 25 MCG tablet TAKE 1 TABLET (25 MCG TOTAL) BY MOUTH DAILY BEFORE BREAKFAST. 30 tablet 1  . metFORMIN (GLUCOPHAGE) 1000 MG tablet 1 tab po bid and 1/2 tab po q noon 75 tablet 5  . metoprolol tartrate (LOPRESSOR) 25 MG tablet Take 1 tablet (25 mg total) by mouth 2 (two) times daily. 60 tablet 1  . ondansetron (ZOFRAN) 4 MG tablet Take 1 tablet (4 mg total) by  mouth every 8 (eight) hours as needed for nausea or vomiting. 20 tablet 0  . ONE TOUCH ULTRA TEST test strip USE ONCE DAILY TO CHECK BLOOD SUGAR. DX E11.9 100 each 2  . Potassium Chloride ER 20 MEQ TBCR Take 20 mEq by mouth daily. 7 tablet 0  . tizanidine (ZANAFLEX) 2 MG capsule Take 1 capsule (2 mg total) by mouth 2 (two) times daily as needed for muscle spasms. 30 capsule 0   No current facility-administered medications on file prior to visit.     Allergies  Allergen Reactions  . Lyrica [Pregabalin] Other (See Comments)    MADE PATIENT VERY EMOTIONAL AND WOULD CRY EASILY  . Morphine And Related Nausea And Vomiting  . Tramadol Nausea And  Vomiting    Pt cant tolerate this pain med.     Family History  Problem Relation Age of Onset  . Arthritis Mother   . Stroke Brother        previous smoker  . Alcohol abuse Brother        in remission  . Leukemia Brother   . Diabetes Paternal Grandmother   . Healthy Son     Social History   Socioeconomic History  . Marital status: Single    Spouse name: Not on file  . Number of children: Not on file  . Years of education: Not on file  . Highest education level: Not on file  Occupational History  . Occupation: Glass blower/designer  Social Needs  . Financial resource strain: Not on file  . Food insecurity:    Worry: Not on file    Inability: Not on file  . Transportation needs:    Medical: Not on file    Non-medical: Not on file  Tobacco Use  . Smoking status: Former Smoker    Packs/day: 0.50    Last attempt to quit: 09/08/2005    Years since quitting: 12.9  . Smokeless tobacco: Never Used  Substance and Sexual Activity  . Alcohol use: No    Alcohol/week: 0.0 standard drinks  . Drug use: No  . Sexual activity: Yes    Partners: Male    Birth control/protection: None  Lifestyle  . Physical activity:    Days per week: Not on file    Minutes per session: Not on file  . Stress: Not on file  Relationships  . Social connections:    Talks on phone: Not on file    Gets together: Not on file    Attends religious service: Not on file    Active member of club or organization: Not on file    Attends meetings of clubs or organizations: Not on file    Relationship status: Not on file  Other Topics Concern  . Not on file  Social History Narrative   Lives with husband in a two story home.  Has one child.     Works as a Glass blower/designer.     Education: high school.   Review of Systems - See HPI.  All other ROS are negative.  BP 132/78   Pulse 90   Temp 98.4 F (36.9 C) (Oral)   Resp 16   Ht _0  (1.575 m)   Wt 149 lb (67.6 kg)   LMP 02/12/2011 (Exact Date)   SpO2  98%   BMI 27.25 kg/m   Physical Exam Constitutional:      Appearance: Normal appearance.  HENT:     Head: Normocephalic and atraumatic.  Right Ear: Tympanic membrane normal.     Left Ear: Tympanic membrane normal.     Nose: Congestion present.     Right Sinus: Maxillary sinus tenderness present.     Mouth/Throat:     Mouth: Mucous membranes are moist.  Cardiovascular:     Rate and Rhythm: Normal rate and regular rhythm.  Pulmonary:     Effort: Pulmonary effort is normal.     Breath sounds: Normal breath sounds.  Neurological:     Mental Status: She is alert.       Assessment/Plan: Rx Augmentin, Tessalon.  Increase fluids.  Rest.  Saline nasal spray.  Probiotic.  Mucinex as directed.  Humidifier in bedroom. Continue Flonase.  Call or return to clinic if symptoms are not improving.   Leeanne Rio, PA-C

## 2018-09-03 NOTE — Patient Instructions (Signed)
Please take antibiotic as directed.  Increase fluid intake.  Use Saline nasal spray.  Take a daily multivitamin. Continue Flonase. Use the tessalon as directed for cough.  Place a humidifier in the bedroom.  Please call or return clinic if symptoms are not improving.  Sinusitis Sinusitis is redness, soreness, and swelling (inflammation) of the paranasal sinuses. Paranasal sinuses are air pockets within the bones of your face (beneath the eyes, the middle of the forehead, or above the eyes). In healthy paranasal sinuses, mucus is able to drain out, and air is able to circulate through them by way of your nose. However, when your paranasal sinuses are inflamed, mucus and air can become trapped. This can allow bacteria and other germs to grow and cause infection. Sinusitis can develop quickly and last only a short time (acute) or continue over a long period (chronic). Sinusitis that lasts for more than 12 weeks is considered chronic.  CAUSES  Causes of sinusitis include:  Allergies.  Structural abnormalities, such as displacement of the cartilage that separates your nostrils (deviated septum), which can decrease the air flow through your nose and sinuses and affect sinus drainage.  Functional abnormalities, such as when the small hairs (cilia) that line your sinuses and help remove mucus do not work properly or are not present. SYMPTOMS  Symptoms of acute and chronic sinusitis are the same. The primary symptoms are pain and pressure around the affected sinuses. Other symptoms include:  Upper toothache.  Earache.  Headache.  Bad breath.  Decreased sense of smell and taste.  A cough, which worsens when you are lying flat.  Fatigue.  Fever.  Thick drainage from your nose, which often is green and may contain pus (purulent).  Swelling and warmth over the affected sinuses. DIAGNOSIS  Your caregiver will perform a physical exam. During the exam, your caregiver may:  Look in your nose for  signs of abnormal growths in your nostrils (nasal polyps).  Tap over the affected sinus to check for signs of infection.  View the inside of your sinuses (endoscopy) with a special imaging device with a light attached (endoscope), which is inserted into your sinuses. If your caregiver suspects that you have chronic sinusitis, one or more of the following tests may be recommended:  Allergy tests.  Nasal culture A sample of mucus is taken from your nose and sent to a lab and screened for bacteria.  Nasal cytology A sample of mucus is taken from your nose and examined by your caregiver to determine if your sinusitis is related to an allergy. TREATMENT  Most cases of acute sinusitis are related to a viral infection and will resolve on their own within 10 days. Sometimes medicines are prescribed to help relieve symptoms (pain medicine, decongestants, nasal steroid sprays, or saline sprays).  However, for sinusitis related to a bacterial infection, your caregiver will prescribe antibiotic medicines. These are medicines that will help kill the bacteria causing the infection.  Rarely, sinusitis is caused by a fungal infection. In theses cases, your caregiver will prescribe antifungal medicine. For some cases of chronic sinusitis, surgery is needed. Generally, these are cases in which sinusitis recurs more than 3 times per year, despite other treatments. HOME CARE INSTRUCTIONS   Drink plenty of water. Water helps thin the mucus so your sinuses can drain more easily.  Use a humidifier.  Inhale steam 3 to 4 times a day (for example, sit in the bathroom with the shower running).  Apply a warm, moist washcloth  to your face 3 to 4 times a day, or as directed by your caregiver.  Use saline nasal sprays to help moisten and clean your sinuses.  Take over-the-counter or prescription medicines for pain, discomfort, or fever only as directed by your caregiver. SEEK IMMEDIATE MEDICAL CARE IF:  You have  increasing pain or severe headaches.  You have nausea, vomiting, or drowsiness.  You have swelling around your face.  You have vision problems.  You have a stiff neck.  You have difficulty breathing. MAKE SURE YOU:   Understand these instructions.  Will watch your condition.  Will get help right away if you are not doing well or get worse. Document Released: 08/25/2005 Document Revised: 11/17/2011 Document Reviewed: 09/09/2011 [facility] Patient Information 2014 [place], [place]

## 2018-09-17 ENCOUNTER — Ambulatory Visit (INDEPENDENT_AMBULATORY_CARE_PROVIDER_SITE_OTHER): Payer: Self-pay | Admitting: Thoracic Surgery (Cardiothoracic Vascular Surgery)

## 2018-09-17 ENCOUNTER — Other Ambulatory Visit: Payer: Self-pay

## 2018-09-17 ENCOUNTER — Encounter: Payer: Self-pay | Admitting: Thoracic Surgery (Cardiothoracic Vascular Surgery)

## 2018-09-17 ENCOUNTER — Other Ambulatory Visit: Payer: Self-pay | Admitting: Physician Assistant

## 2018-09-17 VITALS — BP 170/90 | HR 112 | Resp 18 | Ht 62.0 in | Wt 159.6 lb

## 2018-09-17 DIAGNOSIS — I2510 Atherosclerotic heart disease of native coronary artery without angina pectoris: Secondary | ICD-10-CM

## 2018-09-17 DIAGNOSIS — H6981 Other specified disorders of Eustachian tube, right ear: Secondary | ICD-10-CM

## 2018-09-17 DIAGNOSIS — Z951 Presence of aortocoronary bypass graft: Secondary | ICD-10-CM

## 2018-09-17 NOTE — Progress Notes (Signed)
[address]       [place] [address]             [phone]     HPI: [name] returns for a scheduled follow-up visit  [name] is a 53 year old woman with a history of type 2 diabetes, hypertension, hyperlipidemia, stroke x2, peripheral neuropathy, chronic pain, and depression.  She presented in October with a non-ST elevation MI.  She had a left main and three-vessel coronary disease.  I did coronary bypass grafting x4 on 06/28/2018.  She developed a wound infection of her radial artery incision which was treated with Keflex.  I saw her on 08/24/2018.  She had just started having flu symptoms with general malaise and myalgias and arthralgias along with fevers.  She says that that lasted about 2 weeks but she finally got over around the first of the year.  She is feeling much better currently.  She does have some mild incisional discomfort.  She has not had any recurrent angina.  She still feels tired and rundown.  On initial evaluation her blood pressure and pulse were elevated.  She attributed that to conflict with her son on the drive over.  Past Medical History:  Diagnosis Date  . Acute on chronic diastolic CHF (congestive heart failure) ([place]) 06/24/2018  . Anxiety   . COMMON MIGRAINE 10/07/2010  . Decreased visual acuity 11/10/2016  . Depression 12/18/2012  . Diabetes mellitus type II, uncontrolled ([place]) 10/07/2010   Qualifier: Diagnosis of  By: Charlett Blake MD, [name]    . Diabetic foot infection ([place]) 08/26/2016  . Disturbance of skin sensation 10/07/2010  . Heart murmur   . History of kidney stones    "years ago"  . Hyperlipidemia 12/06/2010  . Hypertension   . Lipoma of abdominal wall 10/05/2016  . Overweight(278.02) 12/06/2010  . PERIPHERAL NEUROPATHY, FEET 10/07/2010  . PVD (peripheral vascular disease) ([place]) 01/21/2012  . RESTLESS LEG SYNDROME 10/25/2010  . Stroke [facility]) 2014, 2017   most recently in 2/17 - intracerebral hemorrhage    Current  Outpatient Medications  Medication Sig Dispense Refill  . acetaminophen (TYLENOL) 325 MG tablet Take 2 tablets (650 mg total) by mouth every 6 (six) hours as needed for mild pain or headache.    Marland Kitchen amLODipine (NORVASC) 5 MG tablet Take 1 tablet (5 mg total) by mouth daily. 30 tablet 1  . amoxicillin-clavulanate (AUGMENTIN) 875-125 MG tablet Take 1 tablet by mouth 2 (two) times daily. 14 tablet 0  . aspirin EC 325 MG EC tablet Take 1 tablet (325 mg total) by mouth daily. 30 tablet 0  . atorvastatin (LIPITOR) 80 MG tablet Take 1 tablet (80 mg total) by mouth daily at 6 PM. 30 tablet 1  . azelastine (ASTELIN) 0.1 % nasal spray Place 2 sprays into both nostrils 2 (two) times daily. Use in each nostril as directed 30 mL 3  . benzonatate (TESSALON) 100 MG capsule Take 1 capsule (100 mg total) by mouth 3 (three) times daily as needed for cough. 30 capsule 0  . Blood Glucose Monitoring Suppl (ONE TOUCH ULTRA 2) w/Device KIT Check blood sugars twice daily. Dx:E11.59 1 each 0  . escitalopram (LEXAPRO) 10 MG tablet TAKE 1 TABLET BY MOUTH EVERY DAY 30 tablet 0  . fluticasone (FLONASE) 50 MCG/ACT nasal spray SPRAY 2 SPRAYS INTO EACH NOSTRIL EVERY DAY 16 g 6  . furosemide (LASIX) 40 MG tablet Take 1 tablet (40 mg total) by mouth daily. 7  tablet 0  . gabapentin (NEURONTIN) 100 MG capsule Take 3 capsules (300 mg total) by mouth 3 (three) times daily. 90 capsule 0  . HYDROcodone-acetaminophen (NORCO) 10-325 MG tablet Take one tablet every 4-6 hours PRN severe pain 20 tablet 0  . Insulin Pen Needle (PEN NEEDLES) 31G X 5 MM MISC 1 Syringe by Does not apply route daily. To use with Lantus Pen 100 each 1  . isosorbide mononitrate (IMDUR) 30 MG 24 hr tablet Take 0.5 tablets (15 mg total) by mouth daily. For one month then stop. 30 tablet 0  . LANTUS SOLOSTAR 100 UNIT/ML Solostar Pen 100 units sq in am and 50 units sq in pm (Patient taking differently: Inject 50-100 Units into the skin See admin instructions. Use 100 units  every morning and use 50 units every evening) 45 mL 4  . levothyroxine (SYNTHROID, LEVOTHROID) 25 MCG tablet TAKE 1 TABLET (25 MCG TOTAL) BY MOUTH DAILY BEFORE BREAKFAST. 30 tablet 1  . metFORMIN (GLUCOPHAGE) 1000 MG tablet 1 tab po bid and 1/2 tab po q noon 75 tablet 5  . metoprolol tartrate (LOPRESSOR) 25 MG tablet Take 1 tablet (25 mg total) by mouth 2 (two) times daily. 60 tablet 1  . ondansetron (ZOFRAN) 4 MG tablet Take 1 tablet (4 mg total) by mouth every 8 (eight) hours as needed for nausea or vomiting. 20 tablet 0  . ONE TOUCH ULTRA TEST test strip USE ONCE DAILY TO CHECK BLOOD SUGAR. DX E11.9 100 each 2  . Potassium Chloride ER 20 MEQ TBCR Take 20 mEq by mouth daily. 7 tablet 0  . tizanidine (ZANAFLEX) 2 MG capsule Take 1 capsule (2 mg total) by mouth 2 (two) times daily as needed for muscle spasms. 30 capsule 0   No current facility-administered medications for this visit.     Physical Exam BP (!) 170/90 (BP Location: Right Arm, Patient Position: Sitting, Cuff Size: Normal)   Pulse (!) 112   Resp 18   Ht _0  (1.575 m)   Wt 159 lb 9.6 oz (72.4 kg)   LMP 02/12/2011 (Exact Date)   SpO2 97%   BMI 29.74 kg/m  53 year old woman in no acute distress Alert and oriented x3 with no focal deficits Lungs clear with equal breath sounds bilaterally Cardiac tachycardic and regular Sternum stable, incision well-healed Left radial incision with 2 areas of eschar distally, no signs of infection Leg incision with eschar, no signs of infection  Impression: [name] is a 53 year old woman with history of complicated type 2 diabetes, hypertension, hyperlipidemia, peripheral vascular disease, stroke x2, chronic pain, and depression.  She presented with an MI and was found to have left main and three-vessel coronary disease.  She underwent coronary bypass grafting x4 on June 28, 2018.  She was just trying to make progress and early December and then in mid December she got the flu.  She  was down with that for about 2 weeks.  That resolved around the first of the year.  She now is starting to feel better but still does not have a lot of energy or stamina.  I suspect that is related to having a bad case of the flu on top of her surgical recovery.  She looks significantly better today than at her last appointment.  I suspect she will make pretty rapid progress at this point.  Her blood pressure was elevated today.  She had a eventful ride over to the office.  Both were improved on repeat.  She will monitor that at home.  If it remains elevated she will contact her primary.  She has not seen Dr.Kelly or any other cardiologist since she left the hospital.  We will arrange for follow-up with him.  There are no restrictions on her activities at this time.  She may drive.  I think she should be able to return to work on Monday September 27, 2018  Plan: Follow-up with Dr. Claiborne Billings  I will be happy to see her back again anytime if I be of any assistance in the future future  [name], MD [facility] [phone]

## 2018-09-25 ENCOUNTER — Other Ambulatory Visit: Payer: Self-pay | Admitting: Family Medicine

## 2018-09-28 ENCOUNTER — Ambulatory Visit: Payer: BLUE CROSS/BLUE SHIELD | Admitting: Thoracic Surgery (Cardiothoracic Vascular Surgery)

## 2018-10-14 ENCOUNTER — Other Ambulatory Visit: Payer: Self-pay | Admitting: Family Medicine

## 2018-11-02 ENCOUNTER — Ambulatory Visit: Payer: BLUE CROSS/BLUE SHIELD | Admitting: Thoracic Surgery (Cardiothoracic Vascular Surgery)

## 2018-12-04 ENCOUNTER — Telehealth: Payer: Self-pay | Admitting: Family Medicine

## 2018-12-04 NOTE — Telephone Encounter (Signed)
Patient called with questions regarding handicap placard.  Please contact.

## 2018-12-09 NOTE — Telephone Encounter (Signed)
Awaiting for pcp to sign form

## 2018-12-11 ENCOUNTER — Inpatient Hospital Stay (HOSPITAL_BASED_OUTPATIENT_CLINIC_OR_DEPARTMENT_OTHER)
Admission: EM | Admit: 2018-12-11 | Discharge: 2018-12-16 | DRG: 853 | Disposition: A | Discharge status: Home / Self Care | Payer: BLUE CROSS/BLUE SHIELD | Attending: Family Medicine | Admitting: Family Medicine

## 2018-12-11 ENCOUNTER — Other Ambulatory Visit: Payer: Self-pay

## 2018-12-11 ENCOUNTER — Encounter (HOSPITAL_BASED_OUTPATIENT_CLINIC_OR_DEPARTMENT_OTHER): Payer: Self-pay | Admitting: Emergency Medicine

## 2018-12-11 ENCOUNTER — Emergency Department (HOSPITAL_BASED_OUTPATIENT_CLINIC_OR_DEPARTMENT_OTHER): Payer: BLUE CROSS/BLUE SHIELD

## 2018-12-11 DIAGNOSIS — I5032 Chronic diastolic (congestive) heart failure: Secondary | ICD-10-CM | POA: Diagnosis present

## 2018-12-11 DIAGNOSIS — T360X5A Adverse effect of penicillins, initial encounter: Secondary | ICD-10-CM | POA: Diagnosis not present

## 2018-12-11 DIAGNOSIS — I110 Hypertensive heart disease with heart failure: Secondary | ICD-10-CM | POA: Diagnosis present

## 2018-12-11 DIAGNOSIS — Z885 Allergy status to narcotic agent status: Secondary | ICD-10-CM

## 2018-12-11 DIAGNOSIS — F419 Anxiety disorder, unspecified: Secondary | ICD-10-CM | POA: Diagnosis present

## 2018-12-11 DIAGNOSIS — I739 Peripheral vascular disease, unspecified: Secondary | ICD-10-CM | POA: Diagnosis present

## 2018-12-11 DIAGNOSIS — E1159 Type 2 diabetes mellitus with other circulatory complications: Secondary | ICD-10-CM | POA: Diagnosis not present

## 2018-12-11 DIAGNOSIS — Z87442 Personal history of urinary calculi: Secondary | ICD-10-CM

## 2018-12-11 DIAGNOSIS — G2581 Restless legs syndrome: Secondary | ICD-10-CM | POA: Diagnosis present

## 2018-12-11 DIAGNOSIS — E43 Unspecified severe protein-calorie malnutrition: Secondary | ICD-10-CM | POA: Diagnosis not present

## 2018-12-11 DIAGNOSIS — L089 Local infection of the skin and subcutaneous tissue, unspecified: Secondary | ICD-10-CM | POA: Diagnosis not present

## 2018-12-11 DIAGNOSIS — Z87891 Personal history of nicotine dependence: Secondary | ICD-10-CM

## 2018-12-11 DIAGNOSIS — Z8673 Personal history of transient ischemic attack (TIA), and cerebral infarction without residual deficits: Secondary | ICD-10-CM

## 2018-12-11 DIAGNOSIS — Y92239 Unspecified place in hospital as the place of occurrence of the external cause: Secondary | ICD-10-CM | POA: Diagnosis not present

## 2018-12-11 DIAGNOSIS — D649 Anemia, unspecified: Secondary | ICD-10-CM | POA: Diagnosis present

## 2018-12-11 DIAGNOSIS — E11621 Type 2 diabetes mellitus with foot ulcer: Secondary | ICD-10-CM | POA: Diagnosis present

## 2018-12-11 DIAGNOSIS — L03115 Cellulitis of right lower limb: Secondary | ICD-10-CM

## 2018-12-11 DIAGNOSIS — Z8261 Family history of arthritis: Secondary | ICD-10-CM

## 2018-12-11 DIAGNOSIS — L02611 Cutaneous abscess of right foot: Secondary | ICD-10-CM | POA: Diagnosis not present

## 2018-12-11 DIAGNOSIS — Z89412 Acquired absence of left great toe: Secondary | ICD-10-CM | POA: Diagnosis not present

## 2018-12-11 DIAGNOSIS — E11319 Type 2 diabetes mellitus with unspecified diabetic retinopathy without macular edema: Secondary | ICD-10-CM | POA: Diagnosis present

## 2018-12-11 DIAGNOSIS — E039 Hypothyroidism, unspecified: Secondary | ICD-10-CM | POA: Diagnosis present

## 2018-12-11 DIAGNOSIS — Z6829 Body mass index (BMI) 29.0-29.9, adult: Secondary | ICD-10-CM | POA: Diagnosis not present

## 2018-12-11 DIAGNOSIS — Z79890 Hormone replacement therapy (postmenopausal): Secondary | ICD-10-CM

## 2018-12-11 DIAGNOSIS — N170 Acute kidney failure with tubular necrosis: Secondary | ICD-10-CM | POA: Diagnosis not present

## 2018-12-11 DIAGNOSIS — Z806 Family history of leukemia: Secondary | ICD-10-CM

## 2018-12-11 DIAGNOSIS — Z951 Presence of aortocoronary bypass graft: Secondary | ICD-10-CM

## 2018-12-11 DIAGNOSIS — E119 Type 2 diabetes mellitus without complications: Secondary | ICD-10-CM | POA: Diagnosis not present

## 2018-12-11 DIAGNOSIS — Z823 Family history of stroke: Secondary | ICD-10-CM

## 2018-12-11 DIAGNOSIS — N179 Acute kidney failure, unspecified: Secondary | ICD-10-CM

## 2018-12-11 DIAGNOSIS — I10 Essential (primary) hypertension: Secondary | ICD-10-CM | POA: Diagnosis not present

## 2018-12-11 DIAGNOSIS — Z888 Allergy status to other drugs, medicaments and biological substances status: Secondary | ICD-10-CM | POA: Diagnosis not present

## 2018-12-11 DIAGNOSIS — E785 Hyperlipidemia, unspecified: Secondary | ICD-10-CM | POA: Diagnosis present

## 2018-12-11 DIAGNOSIS — Z794 Long term (current) use of insulin: Secondary | ICD-10-CM

## 2018-12-11 DIAGNOSIS — A4181 Sepsis due to Enterococcus: Principal | ICD-10-CM | POA: Diagnosis present

## 2018-12-11 DIAGNOSIS — F329 Major depressive disorder, single episode, unspecified: Secondary | ICD-10-CM | POA: Diagnosis present

## 2018-12-11 DIAGNOSIS — Z833 Family history of diabetes mellitus: Secondary | ICD-10-CM

## 2018-12-11 DIAGNOSIS — L97519 Non-pressure chronic ulcer of other part of right foot with unspecified severity: Secondary | ICD-10-CM | POA: Diagnosis not present

## 2018-12-11 DIAGNOSIS — T368X5A Adverse effect of other systemic antibiotics, initial encounter: Secondary | ICD-10-CM | POA: Diagnosis not present

## 2018-12-11 DIAGNOSIS — A411 Sepsis due to other specified staphylococcus: Secondary | ICD-10-CM | POA: Diagnosis present

## 2018-12-11 DIAGNOSIS — E1165 Type 2 diabetes mellitus with hyperglycemia: Secondary | ICD-10-CM | POA: Diagnosis not present

## 2018-12-11 DIAGNOSIS — Z79899 Other long term (current) drug therapy: Secondary | ICD-10-CM

## 2018-12-11 DIAGNOSIS — Z811 Family history of alcohol abuse and dependence: Secondary | ICD-10-CM

## 2018-12-11 DIAGNOSIS — E1151 Type 2 diabetes mellitus with diabetic peripheral angiopathy without gangrene: Secondary | ICD-10-CM | POA: Diagnosis not present

## 2018-12-11 DIAGNOSIS — Z7982 Long term (current) use of aspirin: Secondary | ICD-10-CM

## 2018-12-11 DIAGNOSIS — R2689 Other abnormalities of gait and mobility: Secondary | ICD-10-CM | POA: Diagnosis not present

## 2018-12-11 DIAGNOSIS — E861 Hypovolemia: Secondary | ICD-10-CM | POA: Diagnosis not present

## 2018-12-11 DIAGNOSIS — I25119 Atherosclerotic heart disease of native coronary artery with unspecified angina pectoris: Secondary | ICD-10-CM | POA: Diagnosis not present

## 2018-12-11 DIAGNOSIS — S91301A Unspecified open wound, right foot, initial encounter: Secondary | ICD-10-CM | POA: Diagnosis not present

## 2018-12-11 DIAGNOSIS — E1142 Type 2 diabetes mellitus with diabetic polyneuropathy: Secondary | ICD-10-CM | POA: Diagnosis not present

## 2018-12-11 DIAGNOSIS — E11628 Type 2 diabetes mellitus with other skin complications: Secondary | ICD-10-CM | POA: Diagnosis not present

## 2018-12-11 DIAGNOSIS — A419 Sepsis, unspecified organism: Secondary | ICD-10-CM | POA: Diagnosis not present

## 2018-12-11 HISTORY — DX: Cellulitis of right lower limb: L03.115

## 2018-12-11 LAB — COMPREHENSIVE METABOLIC PANEL
ALT: 17 U/L (ref 0–44)
AST: 14 U/L — ABNORMAL LOW (ref 15–41)
Albumin: 2.4 g/dL — ABNORMAL LOW (ref 3.5–5.0)
Alkaline Phosphatase: 92 U/L (ref 38–126)
Anion gap: 10 (ref 5–15)
BUN: 23 mg/dL — ABNORMAL HIGH (ref 6–20)
CO2: 25 mmol/L (ref 22–32)
Calcium: 8.3 mg/dL — ABNORMAL LOW (ref 8.9–10.3)
Chloride: 98 mmol/L (ref 98–111)
Creatinine, Ser: 1.27 mg/dL — ABNORMAL HIGH (ref 0.44–1.00)
GFR calc Af Amer: 55 mL/min — ABNORMAL LOW (ref >60)
GFR calc non Af Amer: 48 mL/min — ABNORMAL LOW (ref >60)
Glucose, Bld: 308 mg/dL — ABNORMAL HIGH (ref 70–99)
Potassium: 4 mmol/L (ref 3.5–5.1)
Sodium: 133 mmol/L — ABNORMAL LOW (ref 135–145)
Total Bilirubin: 0.7 mg/dL (ref 0.3–1.2)
Total Protein: 6.9 g/dL (ref 6.5–8.1)

## 2018-12-11 LAB — CBC WITH DIFFERENTIAL/PLATELET
Abs Immature Granulocytes: 0.05 10*3/uL (ref 0.00–0.07)
Basophils Absolute: 0.1 10*3/uL (ref 0.0–0.1)
Basophils Relative: 1 %
Eosinophils Absolute: 0 10*3/uL (ref 0.0–0.5)
Eosinophils Relative: 0 %
HCT: 32.5 % — ABNORMAL LOW (ref 36.0–46.0)
Hemoglobin: 10.1 g/dL — ABNORMAL LOW (ref 12.0–15.0)
Immature Granulocytes: 0 %
Lymphocytes Relative: 7 %
Lymphs Abs: 1.1 10*3/uL (ref 0.7–4.0)
MCH: 26.6 pg (ref 26.0–34.0)
MCHC: 31.1 g/dL (ref 30.0–36.0)
MCV: 85.8 fL (ref 80.0–100.0)
Monocytes Absolute: 1.3 10*3/uL — ABNORMAL HIGH (ref 0.1–1.0)
Monocytes Relative: 9 %
Neutro Abs: 12.3 10*3/uL — ABNORMAL HIGH (ref 1.7–7.7)
Neutrophils Relative %: 83 %
Platelets: 458 10*3/uL — ABNORMAL HIGH (ref 150–400)
RBC: 3.79 MIL/uL — ABNORMAL LOW (ref 3.87–5.11)
RDW: 13.3 % (ref 11.5–15.5)
WBC: 14.8 10*3/uL — ABNORMAL HIGH (ref 4.0–10.5)
nRBC: 0 % (ref 0.0–0.2)

## 2018-12-11 LAB — GLUCOSE, CAPILLARY
Glucose-Capillary: 250 mg/dL — ABNORMAL HIGH (ref 70–99)
Glucose-Capillary: 314 mg/dL — ABNORMAL HIGH (ref 70–99)

## 2018-12-11 LAB — LACTIC ACID, PLASMA: Lactic Acid, Venous: 0.9 mmol/L (ref 0.5–1.9)

## 2018-12-11 MED ORDER — VANCOMYCIN HCL IN DEXTROSE 1-5 GM/200ML-% IV SOLN
INTRAVENOUS | Status: AC
  Filled 2018-12-11: qty 200

## 2018-12-11 MED ORDER — AMLODIPINE BESYLATE 5 MG PO TABS
5.0000 mg | ORAL_TABLET | Freq: Every day | ORAL | Status: DC
  Administered 2018-12-12 – 2018-12-16 (×4): 5 mg via ORAL
  Filled 2018-12-11 (×4): qty 1

## 2018-12-11 MED ORDER — SODIUM CHLORIDE 0.9 % IV SOLN
INTRAVENOUS | Status: DC | PRN
  Administered 2018-12-11 (×2): 250 mL via INTRAVENOUS

## 2018-12-11 MED ORDER — HYDROMORPHONE HCL 1 MG/ML IJ SOLN: 0.5000 mg | INTRAMUSCULAR | Status: DC | PRN

## 2018-12-11 MED ORDER — ISOSORBIDE MONONITRATE ER 30 MG PO TB24
15.0000 mg | ORAL_TABLET | Freq: Every day | ORAL | Status: DC
  Administered 2018-12-12 – 2018-12-16 (×4): 15 mg via ORAL
  Filled 2018-12-11 (×4): qty 1

## 2018-12-11 MED ORDER — SODIUM CHLORIDE 0.9 % IV SOLN: INTRAVENOUS | Status: DC | PRN

## 2018-12-11 MED ORDER — ONDANSETRON HCL 4 MG/2ML IJ SOLN
4.0000 mg | Freq: Once | INTRAMUSCULAR | Status: AC
  Administered 2018-12-11: 4 mg via INTRAVENOUS

## 2018-12-11 MED ORDER — INSULIN ASPART 100 UNIT/ML ~~LOC~~ SOLN
0.0000 [IU] | Freq: Three times a day (TID) | SUBCUTANEOUS | Status: DC
  Administered 2018-12-12: 13:00:00 3 [IU] via SUBCUTANEOUS
  Administered 2018-12-12: 09:00:00 2 [IU] via SUBCUTANEOUS
  Administered 2018-12-13: 1 [IU] via SUBCUTANEOUS
  Administered 2018-12-14: 3 [IU] via SUBCUTANEOUS
  Administered 2018-12-14: 5 [IU] via SUBCUTANEOUS

## 2018-12-11 MED ORDER — INSULIN ASPART 100 UNIT/ML ~~LOC~~ SOLN
0.0000 [IU] | Freq: Every day | SUBCUTANEOUS | Status: DC
  Administered 2018-12-11: 4 [IU] via SUBCUTANEOUS

## 2018-12-11 MED ORDER — ESCITALOPRAM OXALATE 10 MG PO TABS
10.0000 mg | ORAL_TABLET | Freq: Every day | ORAL | Status: DC
  Administered 2018-12-12 – 2018-12-16 (×4): 10 mg via ORAL
  Filled 2018-12-11 (×4): qty 1

## 2018-12-11 MED ORDER — METOPROLOL TARTRATE 25 MG PO TABS
25.0000 mg | ORAL_TABLET | Freq: Two times a day (BID) | ORAL | Status: DC
  Administered 2018-12-11 – 2018-12-16 (×9): 25 mg via ORAL
  Filled 2018-12-11 (×10): qty 1

## 2018-12-11 MED ORDER — GABAPENTIN 300 MG PO CAPS
300.0000 mg | ORAL_CAPSULE | Freq: Three times a day (TID) | ORAL | Status: DC
  Administered 2018-12-11 – 2018-12-16 (×12): 300 mg via ORAL
  Filled 2018-12-11 (×13): qty 1

## 2018-12-11 MED ORDER — LACTATED RINGERS IV BOLUS
1000.0000 mL | Freq: Once | INTRAVENOUS | Status: AC
  Administered 2018-12-11: 1000 mL via INTRAVENOUS

## 2018-12-11 MED ORDER — ENOXAPARIN SODIUM 40 MG/0.4ML ~~LOC~~ SOLN
40.0000 mg | SUBCUTANEOUS | Status: DC
  Administered 2018-12-11 – 2018-12-15 (×4): 40 mg via SUBCUTANEOUS
  Filled 2018-12-11 (×5): qty 0.4

## 2018-12-11 MED ORDER — VANCOMYCIN HCL IN DEXTROSE 750-5 MG/150ML-% IV SOLN
750.0000 mg | Freq: Two times a day (BID) | INTRAVENOUS | Status: DC
  Administered 2018-12-12: 750 mg via INTRAVENOUS
  Filled 2018-12-11 (×3): qty 150

## 2018-12-11 MED ORDER — PIPERACILLIN-TAZOBACTAM 3.375 G IVPB 30 MIN
3.3750 g | Freq: Once | INTRAVENOUS | Status: AC
  Administered 2018-12-11: 3.375 g via INTRAVENOUS
  Filled 2018-12-11 (×2): qty 50

## 2018-12-11 MED ORDER — ATORVASTATIN CALCIUM 80 MG PO TABS
80.0000 mg | ORAL_TABLET | Freq: Every day | ORAL | Status: DC
  Administered 2018-12-12 – 2018-12-16 (×5): 80 mg via ORAL
  Filled 2018-12-11 (×3): qty 1
  Filled 2018-12-11: qty 2
  Filled 2018-12-11 (×2): qty 1

## 2018-12-11 MED ORDER — VANCOMYCIN HCL 1000 MG IV SOLR
1000.0000 mg | Freq: Once | INTRAVENOUS | Status: AC
  Administered 2018-12-11: 1000 mg via INTRAVENOUS

## 2018-12-11 MED ORDER — LEVOTHYROXINE SODIUM 25 MCG PO TABS
25.0000 ug | ORAL_TABLET | Freq: Every day | ORAL | Status: DC
  Administered 2018-12-12 – 2018-12-16 (×4): 25 ug via ORAL
  Filled 2018-12-11 (×6): qty 1

## 2018-12-11 MED ORDER — PIPERACILLIN-TAZOBACTAM 3.375 G IVPB
3.3750 g | Freq: Three times a day (TID) | INTRAVENOUS | Status: DC
  Administered 2018-12-11 – 2018-12-14 (×9): 3.375 g via INTRAVENOUS
  Filled 2018-12-11 (×9): qty 50

## 2018-12-11 MED ORDER — ONDANSETRON HCL 4 MG/2ML IJ SOLN
4.0000 mg | Freq: Three times a day (TID) | INTRAMUSCULAR | Status: DC | PRN
  Administered 2018-12-11 – 2018-12-12 (×2): 4 mg via INTRAVENOUS
  Filled 2018-12-11 (×2): qty 2

## 2018-12-11 MED ORDER — PIPERACILLIN-TAZOBACTAM 3.375 G IVPB 30 MIN: 3.3750 g | Freq: Three times a day (TID) | INTRAVENOUS | Status: DC

## 2018-12-11 MED ORDER — SENNOSIDES-DOCUSATE SODIUM 8.6-50 MG PO TABS
2.0000 | ORAL_TABLET | Freq: Two times a day (BID) | ORAL | Status: DC
  Administered 2018-12-12 – 2018-12-16 (×6): 2 via ORAL
  Filled 2018-12-11 (×9): qty 2

## 2018-12-11 MED ORDER — SODIUM CHLORIDE 0.9 % IV SOLN
INTRAVENOUS | Status: DC | PRN
  Administered 2018-12-11: 22:00:00 via INTRAVENOUS

## 2018-12-11 MED ORDER — ONDANSETRON HCL 4 MG/2ML IJ SOLN
INTRAMUSCULAR | Status: AC
  Filled 2018-12-11: qty 2

## 2018-12-11 MED ORDER — ASPIRIN EC 325 MG PO TBEC
325.0000 mg | DELAYED_RELEASE_TABLET | Freq: Every day | ORAL | Status: DC
  Administered 2018-12-12 – 2018-12-16 (×4): 325 mg via ORAL
  Filled 2018-12-11 (×4): qty 1

## 2018-12-11 MED ORDER — HYDROCODONE-ACETAMINOPHEN 5-325 MG PO TABS
2.0000 | ORAL_TABLET | Freq: Once | ORAL | Status: AC
  Administered 2018-12-11: 12:00:00 2 via ORAL
  Filled 2018-12-11: qty 2

## 2018-12-11 NOTE — Progress Notes (Signed)
Arrived to unit per EMS.  Alert and oriented x 4.  Right foot red and swollen.  Puncture wound at ball of foot.  No drainage noted.  Oriented to room/unit.  Voiced understanding.

## 2018-12-11 NOTE — Plan of Care (Signed)
  Problem: Education: Goal: Knowledge of General Education information will improve Description Including pain rating scale, medication(s)/side effects and non-pharmacologic comfort measures Outcome: Progressing   Problem: Health Behavior/Discharge Planning: Goal: Ability to manage health-related needs will improve Outcome: Progressing   Problem: Clinical Measurements: Goal: Ability to maintain clinical measurements within normal limits will improve Outcome: Progressing   Problem: Activity: Goal: Risk for activity intolerance will decrease Outcome: Progressing   Problem: Nutrition: Goal: Adequate nutrition will be maintained Outcome: Progressing   Problem: Pain Managment: Goal: General experience of comfort will improve Outcome: Progressing   Problem: Safety: Goal: Ability to remain free from injury will improve Outcome: Progressing   Problem: Skin Integrity: Goal: Risk for impaired skin integrity will decrease Outcome: Progressing   Problem: Clinical Measurements: Goal: Ability to avoid or minimize complications of infection will improve Outcome: Progressing   Problem: Clinical Measurements: Goal: Will remain free from infection Outcome: Not Progressing Note:  Day of admission for cellulitis Goal: Diagnostic test results will improve Outcome: Not Progressing Note:  Day of admission   Problem: Skin Integrity: Goal: Skin integrity will improve Outcome: Not Progressing Note:  Day of admission   Problem: Clinical Measurements: Goal: Respiratory complications will improve Outcome: Completed/Met Note:  No respiratory complications Goal: Cardiovascular complication will be avoided Outcome: Completed/Met Note:  No cardiovascular complications   Problem: Coping: Goal: Level of anxiety will decrease Outcome: Completed/Met Note:  No anxiety   Problem: Elimination: Goal: Will not experience complications related to bowel motility Outcome:  Completed/Met Note:  No bowel complications Goal: Will not experience complications related to urinary retention Outcome: Completed/Met Note:  No urinary retention complications

## 2018-12-11 NOTE — ED Notes (Signed)
Called Bed Placement to check on status of room - has been showing "assigned" for 1 1/2 hours -- was advised that bed would be approved in a few minutes

## 2018-12-11 NOTE — Progress Notes (Signed)
Pharmacy Antibiotic Note  [name] is a 53 y.o. female admitted on 12/11/2018 with cellulitis.  Pharmacy has been consulted for vanc dosing.  53 yo with a hx of DM/PVD presented to [facility] with 3 days of foot swelling and pain. Vanc has been ordered for cellulitis.   Plan: Vanc 750mg  IV q12>>AUC 420, scr 0.97  Height: 5\' 2"  (157.5 cm) Weight: 160 lb (72.6 kg) IBW/kg (Calculated) : 50.1  Temp (24hrs), Avg:99.3 F (37.4 C), Min:99.3 F (37.4 C), Max:99.3 F (37.4 C)  No results for input(s): WBC, CREATININE, LATICACIDVEN, VANCOTROUGH, VANCOPEAK, VANCORANDOM, GENTTROUGH, GENTPEAK, GENTRANDOM, TOBRATROUGH, TOBRAPEAK, TOBRARND, AMIKACINPEAK, AMIKACINTROU, AMIKACIN in the last 168 hours.  CrCl cannot be calculated (Patient's most recent lab result is older than the maximum 21 days allowed.).    Allergies  Allergen Reactions  . Lyrica [Pregabalin] Other (See Comments)    MADE PATIENT VERY EMOTIONAL AND WOULD CRY EASILY  . Morphine And Related Nausea And Vomiting  . Tramadol Nausea And Vomiting    Pt cant tolerate this pain med.     Antimicrobials this admission: 4/4 vanc>>  Dose adjustments this admission:   Microbiology results: 4/4 blood>> 4/4 wound>>  Onnie Boer, PharmD, [place], AAHIVP, CPP Infectious Disease Pharmacist 12/11/2018 10:17 AM

## 2018-12-11 NOTE — ED Provider Notes (Signed)
[facility] EMERGENCY DEPARTMENT Provider Note   CSN: [id number] Arrival date & time: 12/11/18  4193    History   Chief Complaint Chief Complaint  Patient presents with  . Foot Swelling    HPI [name] is a 53 y.o. female.     53 year old female with extensive past medical history including type 2 diabetes mellitus, PVD, CVA, peripheral neuropathy, dCHF, HTN, HLD who p/w R foot pain. For the past 3 days, she has developed worsening pain, redness, and swelling of R foot that began around great toe and has now extended down dorsal foot onto lower leg. She has been staying in bed elevating it without relief. She reports feeling fatigue and malaise, denies fevers, vomiting, cough/URI symptoms, or other complaints. Denies any known trauma. Has been compliant with medications. Has not checked blood glucose in several days.  The history is provided by the patient.    Past Medical History:  Diagnosis Date  . Acute on chronic diastolic CHF (congestive heart failure) ([place]) 06/24/2018  . Anxiety   . COMMON MIGRAINE 10/07/2010  . Decreased visual acuity 11/10/2016  . Depression 12/18/2012  . Diabetes mellitus type II, uncontrolled ([place]) 10/07/2010   Qualifier: Diagnosis of  By: Charlett Blake MD, [name]    . Diabetic foot infection ([place]) 08/26/2016  . Disturbance of skin sensation 10/07/2010  . Heart murmur   . History of kidney stones    "years ago"  . Hyperlipidemia 12/06/2010  . Hypertension   . Lipoma of abdominal wall 10/05/2016  . Overweight(278.02) 12/06/2010  . PERIPHERAL NEUROPATHY, FEET 10/07/2010  . PVD (peripheral vascular disease) ([place]) 01/21/2012  . RESTLESS LEG SYNDROME 10/25/2010  . Stroke [facility]) 2014, 2017   most recently in 2/17 - intracerebral hemorrhage    Patient Active Problem List   Diagnosis Date Noted  . Cellulitis of foot, right 12/11/2018  . S/P CABG x 3 06/28/2018  . Coronary artery disease involving native coronary artery of native heart with angina  pectoris ([place]) 06/25/2018  . Type 2 diabetes mellitus with vascular disease ([place]) 06/24/2018  . Acute on chronic diastolic CHF (congestive heart failure) ([place]) 06/24/2018  . Non-ST elevation (NSTEMI) myocardial infarction ([place]) 06/24/2018  . Chest pain 06/23/2018  . Glaucoma 09/17/2017  . Diabetic retinopathy ([place]) 09/17/2017  . Otitis media 11/10/2016  . Decreased visual acuity 11/10/2016  . Spinal stenosis, lumbar region with neurogenic claudication 10/22/2016  . Other spondylosis with radiculopathy, lumbar region 10/13/2016  . Lipoma of abdominal wall 10/05/2016  . Acute osteomyelitis of toe, left ([place]) 09/05/2016  . Moderate malnutrition ([place]) 09/04/2016  . Anemia of chronic disease 09/04/2016  . Diabetic foot infection ([place]) 08/26/2016  . Diabetic polyneuropathy associated with diabetes mellitus due to underlying condition ([place]) 05/07/2016  . Orthostatic hypotension 05/07/2016  . Cerebrovascular disease 04/27/2016  . ICH (intracerebral hemorrhage) ([place]) 03/30/2016  . Visit for preventive health examination 05/21/2015  . Breast cancer screening 05/21/2015  . Arm lesion 07/02/2014  . Pain in joint, ankle and foot 04/28/2014  . Pain in limb 01/13/2014  . Neuropathic pain of both legs 01/13/2014  . Hip pain 01/12/2014  . Allergic rhinitis 03/10/2013  . Back pain 03/08/2013  . Depression 12/18/2012  . PVD (peripheral vascular disease) ([place]) 01/21/2012  . Wound of left leg 12/22/2011  . Hyperlipidemia 12/06/2010  . Overweight(278.02) 12/06/2010  . RESTLESS LEG SYNDROME 10/25/2010  . Migraine without aura 10/07/2010  . Hereditary and idiopathic peripheral neuropathy 10/07/2010  . Essential hypertension, benign 10/07/2010  .  DISTURBANCE OF SKIN SENSATION 10/07/2010  . HEART MURMUR, HX OF 10/07/2010  . NEPHROLITHIASIS, HX OF 10/07/2010    Past Surgical History:  Procedure Laterality Date  . AMPUTATION TOE Left 09/09/2016   Procedure: AMPUTATION OF LEFT GREAT TOE;  Surgeon:  Milly Jakob, MD;  Location: [facility];  Service: Orthopedics;  Laterality: Left;  . CORONARY ARTERY BYPASS GRAFT N/A 06/28/2018   Procedure: CORONARY ARTERY BYPASS GRAFTING (CABG) times  four, using left internal mammary artery, endoscopically harvested right saphenous vein, and harvested left radial artery;  Surgeon: [name], MD;  Location: [facility];  Service: Open Heart Surgery;  Laterality: N/A;  . ENDOVEIN HARVEST OF GREATER SAPHENOUS VEIN Right 06/28/2018   Procedure: ENDOVEIN HARVEST OF GREATER SAPHENOUS VEIN;  Surgeon: [name], MD;  Location: [facility];  Service: Open Heart Surgery;  Laterality: Right;  . LEFT HEART CATH AND CORONARY ANGIOGRAPHY N/A 06/24/2018   Procedure: LEFT HEART CATH AND CORONARY ANGIOGRAPHY;  Surgeon: Troy Sine, MD;  Location: [facility] CV LAB;  Service: Cardiovascular;  Laterality: N/A;  . RADIAL ARTERY HARVEST Left 06/28/2018   Procedure: RADIAL ARTERY HARVEST;  Surgeon: [name], MD;  Location: [facility];  Service: Open Heart Surgery;  Laterality: Left;  . TEE WITHOUT CARDIOVERSION N/A 06/28/2018   Procedure: TRANSESOPHAGEAL ECHOCARDIOGRAM (TEE);  Surgeon: [name], MD;  Location: [facility];  Service: Open Heart Surgery;  Laterality: N/A;  . WISDOM TOOTH EXTRACTION       OB History   No obstetric history on file.      Home Medications    Prior to Admission medications   Medication Sig Start Date End Date Taking? Authorizing Provider  acetaminophen (TYLENOL) 325 MG tablet Take 2 tablets (650 mg total) by mouth every 6 (six) hours as needed for mild pain or headache. 07/06/18   Nani Skillern, PA-C  amLODipine (NORVASC) 5 MG tablet Take 1 tablet (5 mg total) by mouth daily. 07/06/18   Nani Skillern, PA-C  amoxicillin-clavulanate (AUGMENTIN) 875-125 MG tablet Take 1 tablet by mouth 2 (two) times daily. 09/03/18   Brunetta Jeans, PA-C  aspirin EC 325 MG EC tablet Take 1 tablet  (325 mg total) by mouth daily. 07/06/18   Nani Skillern, PA-C  atorvastatin (LIPITOR) 80 MG tablet Take 1 tablet (80 mg total) by mouth daily at 6 PM. 07/06/18   Nani Skillern, PA-C  azelastine (ASTELIN) 0.1 % nasal spray Place 2 sprays into both nostrils 2 (two) times daily. Use in each nostril as directed 08/25/18   Brunetta Jeans, PA-C  benzonatate (TESSALON) 100 MG capsule Take 1 capsule (100 mg total) by mouth 3 (three) times daily as needed for cough. 09/03/18   Brunetta Jeans, PA-C  Blood Glucose Monitoring Suppl (ONE TOUCH ULTRA 2) w/Device KIT Check blood sugars twice daily. Dx:E11.59 08/25/18   Brunetta Jeans, PA-C  escitalopram (LEXAPRO) 10 MG tablet TAKE 1 TABLET BY MOUTH EVERY DAY 08/12/18   Mosie Lukes, MD  fluticasone [facility]) 50 MCG/ACT nasal spray SPRAY 2 SPRAYS INTO EACH NOSTRIL EVERY DAY 09/17/18   Brunetta Jeans, PA-C  furosemide (LASIX) 40 MG tablet Take 1 tablet (40 mg total) by mouth daily. 07/19/18   Gold, Patrick Jupiter E, PA-C  gabapentin (NEURONTIN) 100 MG capsule Take 3 capsules (300 mg total) by mouth 3 (three) times daily. 07/11/18   Mosie Lukes, MD  HYDROcodone-acetaminophen [facility]) 10-325 MG tablet Take one tablet every  4-6 hours PRN severe pain 07/06/18   Lars Pinks M, PA-C  Insulin Pen Needle (PEN NEEDLES) 31G X 5 MM MISC 1 Syringe by Does not apply route daily. To use with Lantus Pen 09/03/18   Brunetta Jeans, PA-C  isosorbide mononitrate (IMDUR) 30 MG 24 hr tablet Take 0.5 tablets (15 mg total) by mouth daily. For one month then stop. 07/06/18   Lars Pinks M, PA-C  LANTUS SOLOSTAR 100 UNIT/ML Solostar Pen 100 units sq in am and 50 units sq in pm Patient taking differently: Inject 50-100 Units into the skin See admin instructions. Use 100 units every morning and use 50 units every evening 04/19/18   Mosie Lukes, MD  levothyroxine (SYNTHROID, LEVOTHROID) 25 MCG tablet TAKE 1 TABLET (25 MCG TOTAL) BY MOUTH DAILY BEFORE  BREAKFAST. 10/18/18   Mosie Lukes, MD  metFORMIN (GLUCOPHAGE) 1000 MG tablet 1 tab po bid and 1/2 tab po q noon 07/24/18   Mosie Lukes, MD  metoprolol tartrate (LOPRESSOR) 25 MG tablet Take 1 tablet (25 mg total) by mouth 2 (two) times daily. 07/06/18   Nani Skillern, PA-C  ondansetron (ZOFRAN) 4 MG tablet Take 1 tablet (4 mg total) by mouth every 8 (eight) hours as needed for nausea or vomiting. 07/20/18   Mosie Lukes, MD  ONE TOUCH ULTRA TEST test strip USE ONCE DAILY TO CHECK BLOOD SUGAR. DX E11.9 08/25/18   Brunetta Jeans, PA-C  Potassium Chloride ER 20 MEQ TBCR Take 20 mEq by mouth daily. 07/19/18   Gold, Patrick Jupiter E, PA-C  tizanidine (ZANAFLEX) 2 MG capsule Take 1 capsule (2 mg total) by mouth 2 (two) times daily as needed for muscle spasms. 12/02/17   Mosie Lukes, MD    Family History Family History  Problem Relation Age of Onset  . Arthritis Mother   . Stroke Brother        previous smoker  . Alcohol abuse Brother        in remission  . Leukemia Brother   . Diabetes Paternal Grandmother   . Healthy Son     Social History Social History   Tobacco Use  . Smoking status: Former Smoker    Packs/day: 0.50    Last attempt to quit: 09/08/2005    Years since quitting: 13.2  . Smokeless tobacco: Never Used  Substance Use Topics  . Alcohol use: No    Alcohol/week: 0.0 standard drinks  . Drug use: Yes     Allergies   Lyrica [pregabalin]; Morphine and related; and Tramadol   Review of Systems Review of Systems All other systems reviewed and are negative except that which was mentioned in HPI   Physical Exam Updated Vital Signs BP (!) 170/84 (BP Location: Right Arm)   Pulse (!) 108   Temp 99.3 F (37.4 C) (Oral)   Resp 18   Ht 5' 2"  (1.575 m)   Wt 72.6 kg   LMP 02/12/2011 (Exact Date)   SpO2 98%   BMI 29.26 kg/m   Physical Exam Vitals signs and nursing note reviewed.  Constitutional:      General: She is not in acute distress.     Appearance: She is well-developed.  HENT:     Head: Normocephalic and atraumatic.  Eyes:     Conjunctiva/sclera: Conjunctivae normal.     Pupils: Pupils are equal, round, and reactive to light.  Neck:     Musculoskeletal: Neck supple.  Cardiovascular:     Rate and  Rhythm: Normal rate and regular rhythm.     Heart sounds: Normal heart sounds. No murmur.  Pulmonary:     Effort: Pulmonary effort is normal.     Breath sounds: Normal breath sounds.  Abdominal:     General: Bowel sounds are normal. There is no distension.     Palpations: Abdomen is soft.     Tenderness: There is no abdominal tenderness.  Musculoskeletal:        General: Swelling present.     Comments: Edema of R great toe extending onto dorsal foot and distal lower leg near ankle  Skin:    General: Skin is warm and dry.     Findings: Erythema present.     Comments: Erythema and warmth of R great toe extending onto dorsal foot and anterior lower leg; puncture hole w/ trace clear drainage on plantar foot near 1st MTP joint; developing dark bulla on medial side of foot near 1st MTP joint  Neurological:     Mental Status: She is alert and oriented to person, place, and time.     Comments: Fluent speech  Psychiatric:        Judgment: Judgment normal.      ED Treatments / Results  Labs (all labs ordered are listed, but only abnormal results are displayed) Labs Reviewed  COMPREHENSIVE METABOLIC PANEL - Abnormal; Notable for the following components:      Result Value   Sodium 133 (*)    Glucose, Bld 308 (*)    BUN 23 (*)    Creatinine, Ser 1.27 (*)    Calcium 8.3 (*)    Albumin 2.4 (*)    AST 14 (*)    GFR calc non Af Amer 48 (*)    GFR calc Af Amer 55 (*)    All other components within normal limits  CBC WITH DIFFERENTIAL/PLATELET - Abnormal; Notable for the following components:   WBC 14.8 (*)    RBC 3.79 (*)    Hemoglobin 10.1 (*)    HCT 32.5 (*)    Platelets 458 (*)    Neutro Abs 12.3 (*)    Monocytes  Absolute 1.3 (*)    All other components within normal limits  CULTURE, BLOOD (ROUTINE X 2)  CULTURE, BLOOD (ROUTINE X 2)  AEROBIC CULTURE (SUPERFICIAL SPECIMEN)  LACTIC ACID, PLASMA  LACTIC ACID, PLASMA    EKG None  Radiology Dg Foot Complete Right  Result Date: 12/11/2018 CLINICAL DATA:  History of diabetes, now with wound involving the plantar surface of the right foot. EXAM: RIGHT FOOT COMPLETE - 3+ VIEW COMPARISON:  None. FINDINGS: There is a wound adjacent subcutaneous edema and stranding adjacent to the plantar surface of the base the great toe. No discrete radiopaque foreign body. No discrete areas of osteolysis to suggest osteomyelitis. Distal vascular calcifications. No fracture or dislocation. Joint spaces are preserved. No erosions. IMPRESSION: Wound involving the plantar surface of the base of the great toe without fracture, dislocation, radiopaque foreign body or radiographic evidence osteomyelitis. Electronically Signed   By: Sandi Mariscal M.D.   On: 12/11/2018 10:25    Procedures Procedures (including critical care time)  Medications Ordered in ED Medications  vancomycin (VANCOCIN) 1,000 mg in sodium chloride 0.9 % 250 mL IVPB (has no administration in time range)  vancomycin (VANCOCIN) IVPB 750 mg/150 ml premix (has no administration in time range)  0.9 %  sodium chloride infusion (250 mLs Intravenous New Bag/Given 12/11/18 1059)  lactated ringers bolus 1,000 mL (has no  administration in time range)  piperacillin-tazobactam (ZOSYN) IVPB 3.375 g (3.375 g Intravenous New Bag/Given 12/11/18 1101)     Initial Impression / Assessment and Plan / ED Course  I have reviewed the triage vital signs and the nursing notes.  Pertinent labs & imaging results that were available during my care of the patient were reviewed by me and considered in my medical decision making (see chart for details).       Non-toxic on exam, T 99.3, HR 108, BP 170/84. Foot is concerning for deep  infection, ddx includes osteomyelitis or nec fasc. Obtained blood, wound cultures. Gave IVF bolus and vanc/zosyn for broad coverage given DM and PVD.  XR shows no evidence of osteo although she may ultimately require MRI to rule this out. No evidence of gas gangrene. Labs show WBC 14.8, BG 300 with no evidence of DKA, Cr stable from previous. Discussed admission w/ [facility], Dr. [place] Crane, and pt will be transferred for admission.  Final Clinical Impressions(s) / ED Diagnoses   Final diagnoses:  Diabetic infection of right foot [facility])    ED Discharge Orders    None       Little, Wenda Overland, MD 12/11/18 1141

## 2018-12-11 NOTE — H&P (Signed)
History and Physical  [name]:[id number] DOB: 06-13-1966 DOA: 12/11/2018  Referring physician: Dr Rex Kras PCP: Mosie Lukes, MD  Outpatient Specialists: None Patient coming from: Home  Chief Complaint: R foot swelling and redness  HPI: [name] is a 53 y.o. female with medical history significant for uncontrolled type 2 diabetes, peripheral vascular disease status post left great toe amputation, hypertension, hypothyroidism, chronic depression, peripheral neuropathy, who presented to [facility] ED with complaints of 4 days of right foot swelling and redness.  Patient has a history of peripheral vascular disease with impaired sensation in her feet.  Her son noted that her right foot was red and swollen and asked her to go to the ED be evaluated.  Associated symptoms include subjective fevers at home with intermittent nausea and vomiting.  No worsening or improving factors.  In the ED at [facility] x-ray right foot showed subcutaneous edema and stranding adjacent to the plantar surface of the base of the great toe with no clear evidence of osteomyelitis.  She was started on IV vancomycin and Zosyn and transferred to [facility] for further evaluation and treatment of present condition.  Vital signs noted with low grade temperature T-max 99.3, tachycardic with heart rate 108, uncontrolled hypertension.  Lab studies remarkable for leukocytosis with WBC 14 K.  Admitted for sepsis secondary to right foot cellulitis, rule out osteomyelitis.  ED Course: Transfer from [facility] ED to [facility].  Review of Systems: Review of systems as noted in the HPI. All other systems reviewed and are negative.   Past Medical History:  Diagnosis Date  . Acute on chronic diastolic CHF (congestive heart failure) ([place]) 06/24/2018  . Anxiety   . COMMON MIGRAINE 10/07/2010  . Decreased visual acuity 11/10/2016  . Depression 12/18/2012  . Diabetes mellitus type II, uncontrolled ([place]) 10/07/2010   Qualifier: Diagnosis of  By: Charlett Blake  MD, [name]    . Diabetic foot infection ([place]) 08/26/2016  . Disturbance of skin sensation 10/07/2010  . Heart murmur   . History of kidney stones    "years ago"  . Hyperlipidemia 12/06/2010  . Hypertension   . Lipoma of abdominal wall 10/05/2016  . Overweight(278.02) 12/06/2010  . PERIPHERAL NEUROPATHY, FEET 10/07/2010  . PVD (peripheral vascular disease) ([place]) 01/21/2012  . RESTLESS LEG SYNDROME 10/25/2010  . Stroke [facility]) 2014, 2017   most recently in 2/17 - intracerebral hemorrhage   Past Surgical History:  Procedure Laterality Date  . AMPUTATION TOE Left 09/09/2016   Procedure: AMPUTATION OF LEFT GREAT TOE;  Surgeon: Milly Jakob, MD;  Location: [place];  Service: Orthopedics;  Laterality: Left;  . CORONARY ARTERY BYPASS GRAFT N/A 06/28/2018   Procedure: CORONARY ARTERY BYPASS GRAFTING (CABG) times  four, using left internal mammary artery, endoscopically harvested right saphenous vein, and harvested left radial artery;  Surgeon: [name], MD;  Location: [place];  Service: Open Heart Surgery;  Laterality: N/A;  . ENDOVEIN HARVEST OF GREATER SAPHENOUS VEIN Right 06/28/2018   Procedure: ENDOVEIN HARVEST OF GREATER SAPHENOUS VEIN;  Surgeon: [name], MD;  Location: [place];  Service: Open Heart Surgery;  Laterality: Right;  . LEFT HEART CATH AND CORONARY ANGIOGRAPHY N/A 06/24/2018   Procedure: LEFT HEART CATH AND CORONARY ANGIOGRAPHY;  Surgeon: Troy Sine, MD;  Location: [place] CV LAB;  Service: Cardiovascular;  Laterality: N/A;  . RADIAL ARTERY HARVEST Left 06/28/2018   Procedure: RADIAL ARTERY HARVEST;  Surgeon: [name], MD;  Location: [place];  Service:  Open Heart Surgery;  Laterality: Left;  . TEE WITHOUT CARDIOVERSION N/A 06/28/2018   Procedure: TRANSESOPHAGEAL ECHOCARDIOGRAM (TEE);  Surgeon: [name], MD;  Location: [place];  Service: Open Heart Surgery;  Laterality: N/A;  . WISDOM TOOTH EXTRACTION      Social  History:  reports that she quit smoking about 13 years ago. She smoked 0.50 packs per day. She has never used smokeless tobacco. She reports previous drug use. She reports that she does not drink alcohol.   Allergies  Allergen Reactions  . Lyrica [Pregabalin] Other (See Comments)    MADE PATIENT VERY EMOTIONAL AND WOULD CRY EASILY  . Morphine And Related Nausea And Vomiting  . Tramadol Nausea And Vomiting    Pt cant tolerate this pain med.     Family History  Problem Relation Age of Onset  . Arthritis Mother   . Stroke Brother        previous smoker  . Alcohol abuse Brother        in remission  . Leukemia Brother   . Diabetes Paternal Grandmother   . Healthy Son       Prior to Admission medications   Medication Sig Start Date End Date Taking? Authorizing Provider  acetaminophen (TYLENOL) 325 MG tablet Take 2 tablets (650 mg total) by mouth every 6 (six) hours as needed for mild pain or headache. 07/06/18  Yes Lars Pinks M, PA-C  amLODipine (NORVASC) 5 MG tablet Take 1 tablet (5 mg total) by mouth daily. 07/06/18  Yes Nani Skillern, PA-C  aspirin EC 81 MG tablet Take 81 mg by mouth daily.   Yes [provider]  atorvastatin (LIPITOR) 80 MG tablet Take 1 tablet (80 mg total) by mouth daily at 6 PM. 07/06/18  Yes Tacy Dura, Donielle M, PA-C  escitalopram (LEXAPRO) 10 MG tablet TAKE 1 TABLET BY MOUTH EVERY DAY Patient taking differently: Take 10 mg by mouth daily.  08/12/18  Yes Mosie Lukes, MD  furosemide (LASIX) 40 MG tablet Take 1 tablet (40 mg total) by mouth daily. 07/19/18  Yes Gold, Wayne E, PA-C  gabapentin (NEURONTIN) 100 MG capsule Take 3 capsules (300 mg total) by mouth 3 (three) times daily. 07/11/18  Yes Mosie Lukes, MD  isosorbide mononitrate (IMDUR) 30 MG 24 hr tablet Take 0.5 tablets (15 mg total) by mouth daily. For one month then stop. 07/06/18  Yes Tacy Dura, Donielle M, PA-C  LANTUS SOLOSTAR 100 UNIT/ML Solostar Pen 100 units sq in  am and 50 units sq in pm Patient taking differently: Inject 50 Units into the skin 2 (two) times daily.  04/19/18  Yes Mosie Lukes, MD  levothyroxine (SYNTHROID, LEVOTHROID) 25 MCG tablet TAKE 1 TABLET (25 MCG TOTAL) BY MOUTH DAILY BEFORE BREAKFAST. Patient taking differently: Take 25 mcg by mouth daily before breakfast.  10/18/18  Yes Mosie Lukes, MD  metFORMIN (GLUCOPHAGE) 1000 MG tablet 1 tab po bid and 1/2 tab po q noon Patient taking differently: Take 500 mg by mouth 2 (two) times daily with a meal.  07/24/18  Yes Mosie Lukes, MD  metoprolol tartrate (LOPRESSOR) 25 MG tablet Take 1 tablet (25 mg total) by mouth 2 (two) times daily. 07/06/18  Yes Lars Pinks M, PA-C  Potassium Chloride ER 20 MEQ TBCR Take 20 mEq by mouth daily. 07/19/18  Yes Gold, Wayne E, PA-C  amoxicillin-clavulanate (AUGMENTIN) 875-125 MG tablet Take 1 tablet by mouth 2 (two) times daily. Patient not taking: Reported on 12/11/2018 09/03/18  Brunetta Jeans, PA-C  aspirin EC 325 MG EC tablet Take 1 tablet (325 mg total) by mouth daily. Patient not taking: Reported on 12/11/2018 07/06/18   Nani Skillern, PA-C  azelastine (ASTELIN) 0.1 % nasal spray Place 2 sprays into both nostrils 2 (two) times daily. Use in each nostril as directed Patient not taking: Reported on 12/11/2018 08/25/18   Brunetta Jeans, PA-C  benzonatate (TESSALON) 100 MG capsule Take 1 capsule (100 mg total) by mouth 3 (three) times daily as needed for cough. Patient not taking: Reported on 12/11/2018 09/03/18   Brunetta Jeans, PA-C  Blood Glucose Monitoring Suppl (ONE TOUCH ULTRA 2) w/Device KIT Check blood sugars twice daily. Dx:E11.59 08/25/18   Brunetta Jeans, PA-C  fluticasone [facility]) 50 MCG/ACT nasal spray SPRAY 2 SPRAYS INTO EACH NOSTRIL EVERY DAY Patient not taking: No sig reported 09/17/18   Brunetta Jeans, PA-C  HYDROcodone-acetaminophen [facility]) 10-325 MG tablet Take one tablet every 4-6 hours PRN severe pain  Patient not taking: Reported on 12/11/2018 07/06/18   Nani Skillern, PA-C  Insulin Pen Needle (PEN NEEDLES) 31G X 5 MM MISC 1 Syringe by Does not apply route daily. To use with Lantus Pen 09/03/18   Brunetta Jeans, PA-C  ONE TOUCH ULTRA TEST test strip USE ONCE DAILY TO CHECK BLOOD SUGAR. DX E11.9 08/25/18   Brunetta Jeans, PA-C  tizanidine (ZANAFLEX) 2 MG capsule Take 1 capsule (2 mg total) by mouth 2 (two) times daily as needed for muscle spasms. Patient not taking: Reported on 12/11/2018 12/02/17   Mosie Lukes, MD    Physical Exam: BP (!) 153/134 (BP Location: Left Arm)   Pulse 64   Temp 97.9 F (36.6 C) (Oral)   Resp 20   Ht 5' 2"  (1.575 m)   Wt 70.3 kg   LMP 02/12/2011 (Exact Date)   SpO2 98%   BMI 28.35 kg/m   . General: 53 y.o. year-old female well developed well nourished in no acute distress.  Alert and oriented x3. . Cardiovascular: Regular rate and rhythm with no rubs or gallops.  No thyromegaly or JVD noted.  Trace lower extremity edema. 2/4 pulses in all 4 extremities. Marland Kitchen Respiratory: Clear to auscultation with no wheezes or rales. Good inspiratory effort. . Abdomen: Soft nontender nondistended with normal bowel sounds x4 quadrants. . Muskuloskeletal: No cyanosis or clubbing.  Trace edema noted in lower extremities bilaterally.  . Neuro: Intact strength, she has no sensation in her feet with mild touch. . Skin: Punctated lesion in plantar surface of right great toe. Marland Kitchen Psychiatry: Judgement and insight appear normal. Mood is appropriate for condition and setting          Labs on Admission:  Basic Metabolic Panel: Recent Labs  Lab 12/11/18 1003  NA 133*  K 4.0  CL 98  CO2 25  GLUCOSE 308*  BUN 23*  CREATININE 1.27*  CALCIUM 8.3*   Liver Function Tests: Recent Labs  Lab 12/11/18 1003  AST 14*  ALT 17  ALKPHOS 92  BILITOT 0.7  PROT 6.9  ALBUMIN 2.4*   No results for input(s): LIPASE, AMYLASE in the last 168 hours. No results for input(s):  AMMONIA in the last 168 hours. CBC: Recent Labs  Lab 12/11/18 1003  WBC 14.8*  NEUTROABS 12.3*  HGB 10.1*  HCT 32.5*  MCV 85.8  PLT 458*   Cardiac Enzymes: No results for input(s): CKTOTAL, CKMB, CKMBINDEX, TROPONINI in the last 168 hours.  BNP (  last 3 results) Recent Labs    06/23/18 2116  BNP 335.2*    ProBNP (last 3 results) No results for input(s): PROBNP in the last 8760 hours.  CBG: No results for input(s): GLUCAP in the last 168 hours.  Radiological Exams on Admission: Dg Foot Complete Right  Result Date: 12/11/2018 CLINICAL DATA:  History of diabetes, now with wound involving the plantar surface of the right foot. EXAM: RIGHT FOOT COMPLETE - 3+ VIEW COMPARISON:  None. FINDINGS: There is a wound adjacent subcutaneous edema and stranding adjacent to the plantar surface of the base the great toe. No discrete radiopaque foreign body. No discrete areas of osteolysis to suggest osteomyelitis. Distal vascular calcifications. No fracture or dislocation. Joint spaces are preserved. No erosions. IMPRESSION: Wound involving the plantar surface of the base of the great toe without fracture, dislocation, radiopaque foreign body or radiographic evidence osteomyelitis. Electronically Signed   By: Sandi Mariscal M.D.   On: 12/11/2018 10:25    EKG: I independently viewed the EKG done and my findings are as followed: None available at the time of this visit.  Assessment/Plan Present on Admission: . Cellulitis of foot, right  Active Problems:   Cellulitis of foot, right   Sepsis secondary to right foot cellulitis, rule out osteomyelitis Presented with tachycardia and leukocytosis with WBC 14 K Obtain CRP and ESR X-ray done at [facility] showed no clear evidence of osteomyelitis Obtain blood cultures x2 HIV screening Continue IV antibiotics empirically Monitor fever curve and WBC Obtain CBC in the morning  AKI suspect prerenal from dehydration Baseline creatinine appears to be 0.9  with GFR greater than 60 Presented with creatinine of 1.27 with GFR of 48 Avoid nephrotoxic agents/dehydration/hypotension Monitor urine output Repeat BMP in the morning  Intractable nausea and vomiting Suspect secondary to sepsis Treat infection Gentle IV fluid hydration IV Zofran PRN Clear liquid diet Advance diet as tolerated  Peripheral vascular disease status post left great toe amputation Continue home medications Fall precautions  Uncontrolled type 2 diabetes with hyperglycemia Presented with chemistry blood sugar over 300 Obtain A1c Start insulin sliding scale  Chronic normocytic anemia Hemoglobin at baseline 10 No sign of overt bleeding Repeat CBC in the morning  Chronic diastolic CHF  No acute issues Last 2D echo done on 06/24/2018 revealed LVEF 50 to 55% with grade 2 diastolic dysfunction Strict I's and O's and daily weight Resume cardiac medications  Chronic anxiety/depression Resume escitalopram  Hypothyroidism Resume levothyroxine  Hyperlipidemia Resume Lipitor 80 mg daily  Peripheral neuropathy Resume gabapentin  Risks: Risk for decompensation due to ongoing sepsis secondary to cellulitis, rule out osteomyelitis, multiple comorbidities and age.  Patient will require least 2 midnights for further evaluation and treatment of present condition    DVT prophylaxis: Subcu Lovenox daily  Code Status: Full code  Family Communication: None at bedside  Disposition Plan: Admit to MedSurg  Consults called: None  Admission status: Inpatient status    Kayleen Memos MD [facility] Pager [phone]  If 7PM-7AM, please contact night-coverage [email or web address] Password [facility]  12/11/2018, 4:54 PM

## 2018-12-11 NOTE — ED Triage Notes (Signed)
Pt c/o R foot, pain, swelling, and redness x 2 days.

## 2018-12-11 NOTE — Progress Notes (Signed)
53 yo F PMH uncontrolled insulin dependent diabetes, PVD s/p prior left great toe amputation, HTN, hypothyroidism, chronic depression, peripheral neuropathy, who presented to [facility] ED with complaints of a few days R foot pain and swelling with puncture in plantar region.  On presentation low grade temp with Tmax 99.3, tachycardic/ Labs remarkable for leukocytosis, xray right foot shows subcutaneous edema and stranding adjacent to the plantar surface of the base of the great toe with no clear evidence of osteomyelitis but may still need to be ruled out due to clinical presentation.   Started on IV vancomycin and Zosyn in the ED. [facility] asked to admit for sepsis 2/2 R foot cellulitis r/o osteomyelitis.  Admitted to [place] at [facility] as inpatient status.

## 2018-12-12 DIAGNOSIS — I25119 Atherosclerotic heart disease of native coronary artery with unspecified angina pectoris: Secondary | ICD-10-CM

## 2018-12-12 DIAGNOSIS — Z951 Presence of aortocoronary bypass graft: Secondary | ICD-10-CM

## 2018-12-12 DIAGNOSIS — L02611 Cutaneous abscess of right foot: Secondary | ICD-10-CM

## 2018-12-12 DIAGNOSIS — E1142 Type 2 diabetes mellitus with diabetic polyneuropathy: Secondary | ICD-10-CM

## 2018-12-12 DIAGNOSIS — L089 Local infection of the skin and subcutaneous tissue, unspecified: Secondary | ICD-10-CM

## 2018-12-12 DIAGNOSIS — I10 Essential (primary) hypertension: Secondary | ICD-10-CM

## 2018-12-12 DIAGNOSIS — E1159 Type 2 diabetes mellitus with other circulatory complications: Secondary | ICD-10-CM

## 2018-12-12 DIAGNOSIS — I739 Peripheral vascular disease, unspecified: Secondary | ICD-10-CM

## 2018-12-12 DIAGNOSIS — E11628 Type 2 diabetes mellitus with other skin complications: Secondary | ICD-10-CM

## 2018-12-12 DIAGNOSIS — E43 Unspecified severe protein-calorie malnutrition: Secondary | ICD-10-CM

## 2018-12-12 LAB — CBC WITH DIFFERENTIAL/PLATELET
Abs Immature Granulocytes: 0.07 10*3/uL (ref 0.00–0.07)
Basophils Absolute: 0.1 10*3/uL (ref 0.0–0.1)
Basophils Relative: 1 %
Eosinophils Absolute: 0.2 10*3/uL (ref 0.0–0.5)
Eosinophils Relative: 1 %
HCT: 33.4 % — ABNORMAL LOW (ref 36.0–46.0)
Hemoglobin: 10.6 g/dL — ABNORMAL LOW (ref 12.0–15.0)
Immature Granulocytes: 1 %
Lymphocytes Relative: 10 %
Lymphs Abs: 1.5 10*3/uL (ref 0.7–4.0)
MCH: 27.5 pg (ref 26.0–34.0)
MCHC: 31.7 g/dL (ref 30.0–36.0)
MCV: 86.8 fL (ref 80.0–100.0)
Monocytes Absolute: 1.2 10*3/uL — ABNORMAL HIGH (ref 0.1–1.0)
Monocytes Relative: 8 %
Neutro Abs: 12.2 10*3/uL — ABNORMAL HIGH (ref 1.7–7.7)
Neutrophils Relative %: 79 %
Platelets: 497 10*3/uL — ABNORMAL HIGH (ref 150–400)
RBC: 3.85 MIL/uL — ABNORMAL LOW (ref 3.87–5.11)
RDW: 13.3 % (ref 11.5–15.5)
WBC: 15.3 10*3/uL — ABNORMAL HIGH (ref 4.0–10.5)
nRBC: 0 % (ref 0.0–0.2)

## 2018-12-12 LAB — BASIC METABOLIC PANEL
Anion gap: 10 (ref 5–15)
BUN: 24 mg/dL — ABNORMAL HIGH (ref 6–20)
CO2: 25 mmol/L (ref 22–32)
Calcium: 8.5 mg/dL — ABNORMAL LOW (ref 8.9–10.3)
Chloride: 100 mmol/L (ref 98–111)
Creatinine, Ser: 1.38 mg/dL — ABNORMAL HIGH (ref 0.44–1.00)
GFR calc Af Amer: 50 mL/min — ABNORMAL LOW (ref >60)
GFR calc non Af Amer: 43 mL/min — ABNORMAL LOW (ref >60)
Glucose, Bld: 180 mg/dL — ABNORMAL HIGH (ref 70–99)
Potassium: 3.7 mmol/L (ref 3.5–5.1)
Sodium: 135 mmol/L (ref 135–145)

## 2018-12-12 LAB — GLUCOSE, CAPILLARY
Glucose-Capillary: 200 mg/dL — ABNORMAL HIGH (ref 70–99)
Glucose-Capillary: 250 mg/dL — ABNORMAL HIGH (ref 70–99)
Glucose-Capillary: 102 mg/dL — ABNORMAL HIGH (ref 70–99)
Glucose-Capillary: 166 mg/dL — ABNORMAL HIGH (ref 70–99)

## 2018-12-12 LAB — C-REACTIVE PROTEIN: CRP: 28.2 mg/dL — ABNORMAL HIGH (ref <1.0)

## 2018-12-12 LAB — HEMOGLOBIN A1C
Hgb A1c MFr Bld: 10.5 % — ABNORMAL HIGH (ref 4.8–5.6)
Mean Plasma Glucose: 254.65 mg/dL

## 2018-12-12 LAB — SEDIMENTATION RATE: Sed Rate: 130 mm/hr — ABNORMAL HIGH (ref 0–22)

## 2018-12-12 MED ORDER — SODIUM CHLORIDE 0.9 % IV SOLN
INTRAVENOUS | Status: AC
  Administered 2018-12-12 (×2): via INTRAVENOUS

## 2018-12-12 MED ORDER — INSULIN GLARGINE 100 UNIT/ML ~~LOC~~ SOLN
25.0000 [IU] | Freq: Every day | SUBCUTANEOUS | Status: DC
  Administered 2018-12-12 – 2018-12-14 (×3): 25 [IU] via SUBCUTANEOUS
  Filled 2018-12-12 (×4): qty 0.25

## 2018-12-12 MED ORDER — VANCOMYCIN HCL IN DEXTROSE 750-5 MG/150ML-% IV SOLN
750.0000 mg | INTRAVENOUS | Status: DC
  Administered 2018-12-13 – 2018-12-14 (×2): 750 mg via INTRAVENOUS
  Filled 2018-12-12 (×2): qty 150

## 2018-12-12 NOTE — Progress Notes (Signed)
Pt arrived to room 6N14 via [facility]. See assessment. Will continue to monitor.

## 2018-12-12 NOTE — Consult Note (Signed)
ORTHOPAEDIC CONSULTATION  REQUESTING PHYSICIAN: Eugenie Filler, MD  Chief Complaint: Abscess ulceration right great toe  HPI: [name] is a 53 y.o. female who presents with abscess ulceration right great toe.  Patient has diabetic insensate neuropathy and complains of acute pain and swelling and cellulitis.  Past Medical History:  Diagnosis Date   Acute on chronic diastolic CHF (congestive heart failure) ([place]) 06/24/2018   Anxiety    COMMON MIGRAINE 10/07/2010   Decreased visual acuity 11/10/2016   Depression 12/18/2012   Diabetes mellitus type II, uncontrolled ([place]) 10/07/2010   Qualifier: Diagnosis of  By: Charlett Blake MD, Stacey     Diabetic foot infection ([place]) 08/26/2016   Disturbance of skin sensation 10/07/2010   Heart murmur    History of kidney stones    "years ago"   Hyperlipidemia 12/06/2010   Hypertension    Lipoma of abdominal wall 10/05/2016   Overweight(278.02) 12/06/2010   PERIPHERAL NEUROPATHY, FEET 10/07/2010   PVD (peripheral vascular disease) ([place]) 01/21/2012   RESTLESS LEG SYNDROME 10/25/2010   Stroke ([place]) 2014, 2017   most recently in 2/17 - intracerebral hemorrhage   Past Surgical History:  Procedure Laterality Date   AMPUTATION TOE Left 09/09/2016   Procedure: AMPUTATION OF LEFT GREAT TOE;  Surgeon: Milly Jakob, MD;  Location: [place];  Service: Orthopedics;  Laterality: Left;   CORONARY ARTERY BYPASS GRAFT N/A 06/28/2018   Procedure: CORONARY ARTERY BYPASS GRAFTING (CABG) times  four, using left internal mammary artery, endoscopically harvested right saphenous vein, and harvested left radial artery;  Surgeon: [name], MD;  Location: [facility];  Service: Open Heart Surgery;  Laterality: N/A;   ENDOVEIN HARVEST OF GREATER SAPHENOUS VEIN Right 06/28/2018   Procedure: ENDOVEIN HARVEST OF GREATER SAPHENOUS VEIN;  Surgeon: [name], MD;  Location: [facility];  Service: Open Heart Surgery;   Laterality: Right;   LEFT HEART CATH AND CORONARY ANGIOGRAPHY N/A 06/24/2018   Procedure: LEFT HEART CATH AND CORONARY ANGIOGRAPHY;  Surgeon: Troy Sine, MD;  Location: [facility];  Service: Cardiovascular;  Laterality: N/A;   RADIAL ARTERY HARVEST Left 06/28/2018   Procedure: RADIAL ARTERY HARVEST;  Surgeon: [name], MD;  Location: [place];  Service: Open Heart Surgery;  Laterality: Left;   TEE WITHOUT CARDIOVERSION N/A 06/28/2018   Procedure: TRANSESOPHAGEAL ECHOCARDIOGRAM (TEE);  Surgeon: [name], MD;  Location: [place];  Service: Open Heart Surgery;  Laterality: N/A;   WISDOM TOOTH EXTRACTION     Social History   Socioeconomic History   Marital status: Single    Spouse name: Not on file   Number of children: Not on file   Years of education: Not on file   Highest education level: Not on file  Occupational History   Occupation: Glass blower/designer  Social Needs   Financial resource strain: Not on file   Food insecurity:    Worry: Not on file    Inability: Not on file   Transportation needs:    Medical: Not on file    Non-medical: Not on file  Tobacco Use   Smoking status: Former Smoker    Packs/day: 0.50    Last attempt to quit: 09/08/2005    Years since quitting: 13.2   Smokeless tobacco: Never Used  Substance and Sexual Activity   Alcohol use: No    Alcohol/week: 0.0 standard drinks   Drug use: Not Currently   Sexual activity: Yes    Partners: Male  Birth control/protection: None  Lifestyle   Physical activity:    Days per week: Not on file    Minutes per session: Not on file   Stress: Not on file  Relationships   Social connections:    Talks on phone: Not on file    Gets together: Not on file    Attends religious service: Not on file    Active member of club or organization: Not on file    Attends meetings of clubs or organizations: Not on file    Relationship status: Not on file  Other Topics Concern    Not on file  Social History Narrative   Lives with husband in a two story home.  Has one child.     Works as a Glass blower/designer.     Education: high school.   Family History  Problem Relation Age of Onset   Arthritis Mother    Stroke Brother        previous smoker   Alcohol abuse Brother        in remission   Leukemia Brother    Diabetes Paternal Grandmother    Healthy Son    - negative except otherwise stated in the family history section Allergies  Allergen Reactions   Lyrica [Pregabalin] Other (See Comments)    MADE PATIENT VERY EMOTIONAL AND WOULD CRY EASILY   Morphine And Related Nausea And Vomiting   Tramadol Nausea And Vomiting    Pt cant tolerate this pain med.    Prior to Admission medications   Medication Sig Start Date End Date Taking? Authorizing Provider  acetaminophen (TYLENOL) 325 MG tablet Take 2 tablets (650 mg total) by mouth every 6 (six) hours as needed for mild pain or headache. 07/06/18  Yes Lars Pinks M, PA-C  amLODipine (NORVASC) 5 MG tablet Take 1 tablet (5 mg total) by mouth daily. 07/06/18  Yes Nani Skillern, PA-C  aspirin EC 81 MG tablet Take 81 mg by mouth daily.   Yes [provider]  atorvastatin (LIPITOR) 80 MG tablet Take 1 tablet (80 mg total) by mouth daily at 6 PM. 07/06/18  Yes Tacy Dura, Donielle M, PA-C  escitalopram (LEXAPRO) 10 MG tablet TAKE 1 TABLET BY MOUTH EVERY DAY Patient taking differently: Take 10 mg by mouth daily.  08/12/18  Yes Mosie Lukes, MD  furosemide (LASIX) 40 MG tablet Take 1 tablet (40 mg total) by mouth daily. 07/19/18  Yes Gold, Wayne E, PA-C  gabapentin (NEURONTIN) 100 MG capsule Take 3 capsules (300 mg total) by mouth 3 (three) times daily. 07/11/18  Yes Mosie Lukes, MD  isosorbide mononitrate (IMDUR) 30 MG 24 hr tablet Take 0.5 tablets (15 mg total) by mouth daily. For one month then stop. 07/06/18  Yes Tacy Dura, Donielle M, PA-C  LANTUS SOLOSTAR 100 UNIT/ML Solostar Pen 100  units sq in am and 50 units sq in pm Patient taking differently: Inject 50 Units into the skin 2 (two) times daily.  04/19/18  Yes Mosie Lukes, MD  levothyroxine (SYNTHROID, LEVOTHROID) 25 MCG tablet TAKE 1 TABLET (25 MCG TOTAL) BY MOUTH DAILY BEFORE BREAKFAST. Patient taking differently: Take 25 mcg by mouth daily before breakfast.  10/18/18  Yes Mosie Lukes, MD  metFORMIN (GLUCOPHAGE) 1000 MG tablet 1 tab po bid and 1/2 tab po q noon Patient taking differently: Take 500 mg by mouth 2 (two) times daily with a meal.  07/24/18  Yes Mosie Lukes, MD  metoprolol tartrate (LOPRESSOR) 25 MG  tablet Take 1 tablet (25 mg total) by mouth 2 (two) times daily. 07/06/18  Yes Lars Pinks M, PA-C  Potassium Chloride ER 20 MEQ TBCR Take 20 mEq by mouth daily. 07/19/18  Yes Gold, Wayne E, PA-C  amoxicillin-clavulanate (AUGMENTIN) 875-125 MG tablet Take 1 tablet by mouth 2 (two) times daily. Patient not taking: Reported on 12/11/2018 09/03/18   Brunetta Jeans, PA-C  aspirin EC 325 MG EC tablet Take 1 tablet (325 mg total) by mouth daily. Patient not taking: Reported on 12/11/2018 07/06/18   Nani Skillern, PA-C  azelastine (ASTELIN) 0.1 % nasal spray Place 2 sprays into both nostrils 2 (two) times daily. Use in each nostril as directed Patient not taking: Reported on 12/11/2018 08/25/18   Brunetta Jeans, PA-C  benzonatate (TESSALON) 100 MG capsule Take 1 capsule (100 mg total) by mouth 3 (three) times daily as needed for cough. Patient not taking: Reported on 12/11/2018 09/03/18   Brunetta Jeans, PA-C  Blood Glucose Monitoring Suppl (ONE TOUCH ULTRA 2) w/Device KIT Check blood sugars twice daily. Dx:E11.59 08/25/18   Brunetta Jeans, PA-C  fluticasone [facility]) 50 MCG/ACT nasal spray SPRAY 2 SPRAYS INTO EACH NOSTRIL EVERY DAY Patient not taking: No sig reported 09/17/18   Brunetta Jeans, PA-C  HYDROcodone-acetaminophen [facility]) 10-325 MG tablet Take one tablet every 4-6 hours PRN severe  pain Patient not taking: Reported on 12/11/2018 07/06/18   Nani Skillern, PA-C  Insulin Pen Needle (PEN NEEDLES) 31G X 5 MM MISC 1 Syringe by Does not apply route daily. To use with Lantus Pen 09/03/18   Brunetta Jeans, PA-C  ONE TOUCH ULTRA TEST test strip USE ONCE DAILY TO CHECK BLOOD SUGAR. DX E11.9 08/25/18   Brunetta Jeans, PA-C  tizanidine (ZANAFLEX) 2 MG capsule Take 1 capsule (2 mg total) by mouth 2 (two) times daily as needed for muscle spasms. Patient not taking: Reported on 12/11/2018 12/02/17   Mosie Lukes, MD   Dg Foot Complete Right  Result Date: 12/11/2018 CLINICAL DATA:  History of diabetes, now with wound involving the plantar surface of the right foot. EXAM: RIGHT FOOT COMPLETE - 3+ VIEW COMPARISON:  None. FINDINGS: There is a wound adjacent subcutaneous edema and stranding adjacent to the plantar surface of the base the great toe. No discrete radiopaque foreign body. No discrete areas of osteolysis to suggest osteomyelitis. Distal vascular calcifications. No fracture or dislocation. Joint spaces are preserved. No erosions. IMPRESSION: Wound involving the plantar surface of the base of the great toe without fracture, dislocation, radiopaque foreign body or radiographic evidence osteomyelitis. Electronically Signed   By: Sandi Mariscal M.D.   On: 12/11/2018 10:25   - pertinent xrays, CT, MRI studies were reviewed and independently interpreted  Positive ROS: All other systems have been reviewed and were otherwise negative with the exception of those mentioned in the HPI and as above.  Physical Exam: General: Alert, no acute distress Psychiatric: Patient is competent for consent with normal mood and affect Lymphatic: No axillary or cervical lymphadenopathy Cardiovascular: No pedal edema Respiratory: No cyanosis, no use of accessory musculature GI: No organomegaly, abdomen is soft and non-tender    Images:  @ENCIMAGES @  Labs:  Lab Results  Component Value Date     HGBA1C 10.5 (H) 12/12/2018   HGBA1C 11.1 (H) 06/26/2018   HGBA1C 9.5 (H) 11/17/2017   ESRSEDRATE 130 (H) 12/12/2018   ESRSEDRATE 115 (H) 06/24/2018   ESRSEDRATE 107 (H) 09/04/2016   CRP 28.2 (  H) 12/12/2018   CRP 7.2 (H) 09/04/2016   CRP 0.7 03/21/2015   LABURIC 3.2 06/13/2014   LABURIC 3.1 02/09/2014   REPTSTATUS PENDING 12/11/2018   GRAMSTAIN NO WBC SEEN FEW GRAM POSITIVE COCCI IN PAIRS  12/11/2018   CULT  12/11/2018    CULTURE REINCUBATED FOR BETTER GROWTH Performed at [facility], [address] [place], [place] [address]    LABORGA NO GROWTH 2 DAYS 09/12/2016    Lab Results  Component Value Date   ALBUMIN 2.4 (L) 12/11/2018   ALBUMIN 3.4 (L) 07/20/2018   ALBUMIN 2.6 (L) 06/24/2018   PREALBUMIN 12.7 (L) 09/04/2016   LABURIC 3.2 06/13/2014   LABURIC 3.1 02/09/2014    Neurologic: Patient does not have protective sensation bilateral lower extremities.   MUSCULOSKELETAL:   Skin: Examination patient has cellulitis and swelling around her right great toe.  There is fluctuance and an abscess with an ulcer plantarly.  Patient has a strong dorsalis pedis pulse most recent ankle-brachial indices shows triphasic good circulation.  Review of the radiographs shows air in the soft tissue but no destructive bony changes.  Patient has a hemoglobin A1c of 10.5 and an albumin of 2.4.  Assessment: Assessment: Diabetic insensate neuropathy with severe protein caloric malnutrition with abscess and ulceration right great toe.  Plan: Plan: We will plan for amputation of the right great toe tomorrow Monday afternoon.  Patient will need to be transferred to counseling hospital today or tomorrow.  Thank you for the consult and the opportunity to see Ms. Helayne [place]in[place], [place] [phone] 1:43 PM

## 2018-12-12 NOTE — Progress Notes (Signed)
Received report from [place], Therapist, sports at [facility].

## 2018-12-12 NOTE — Anesthesia Preprocedure Evaluation (Addendum)
Anesthesia Evaluation  Patient identified by MRN, date of birth, ID band Patient awake    Reviewed: Allergy & Precautions, H&P , NPO status , Patient's Chart, lab work & pertinent test results  Airway Mallampati: II   Neck ROM: full    Dental   Pulmonary neg pulmonary ROS, former smoker,    breath sounds clear to auscultation       Cardiovascular hypertension, + angina + Peripheral Vascular Disease and +CHF   Rhythm:regular Rate:Normal  06/24/18 TTE  Left ventricle: The cavity size was normal. Wall thickness was   increased in a pattern of mild LVH. Systolic function was low   normal to mildly reduced. The estimated ejection fraction was in   the range of 50% to 55%. Anterior and anterolateral hypokinesis.   Features are consistent with a pseudonormal left ventricular   filling pattern, with concomitant abnormal relaxation and   increased filling pressure (grade 2 diastolic dysfunction).   Neuro/Psych  Headaches, PSYCHIATRIC DISORDERS Depression CVA    GI/Hepatic   Endo/Other  diabetes, Type 2  Renal/GU Cr 1.3     Musculoskeletal  (+) Arthritis ,   Abdominal   Peds  Hematology  (+) Blood dyscrasia, anemia , Hgb 10.6 Plt 497    Anesthesia Other Findings   Reproductive/Obstetrics                            Anesthesia Physical Anesthesia Plan  ASA: III  Anesthesia Plan: MAC and Regional   Post-op Pain Management:    Induction: Intravenous  PONV Risk Score and Plan: Treatment may vary due to age or medical condition and Propofol infusion  Airway Management Planned: Natural Airway and Simple Face Mask  Additional Equipment:   Intra-op Plan:   Post-operative Plan:   Informed Consent: I have reviewed the patients History and Physical, chart, labs and discussed the procedure including the risks, benefits and alternatives for the proposed anesthesia with the patient or authorized  representative who has indicated his/her understanding and acceptance.     Dental advisory given  Plan Discussed with: CRNA, Anesthesiologist and Surgeon  Anesthesia Plan Comments: (R great toe Amputation under popliteal block w MAC)       Anesthesia Quick Evaluation

## 2018-12-12 NOTE — Progress Notes (Signed)
Pharmacy Antibiotic Note  [name] is a 53 y.o. female admitted on 12/11/2018 with cellulitis.  Pharmacy has been consulted for vanc dosing.  53 yo with a hx of DM/PVD presented to [facility] with 3 days of foot swelling and pain. Vanc has been ordered for cellulitis.   12/12/18 10:26 AM  - SCr increasing 1.27 > 1.38 - WBC 15.3, afebrile - Cultures pending  Plan: - With SCr increased, will change vancomycin dosing as follows:   Vancomycin 750 mg IV Q 24 hrs. Goal AUC 400-550. Expected AUC: 423 SCr used: 1.38   Height: 5\' 2"  (157.5 cm) Weight: 154 lb 15.7 oz (70.3 kg) IBW/kg (Calculated) : 50.1  Temp (24hrs), Avg:97.9 F (36.6 C), Min:97.8 F (36.6 C), Max:98.1 F (36.7 C)  Recent Labs  Lab 12/11/18 1003 12/11/18 1004 12/12/18 0523  WBC 14.8*  --  15.3*  CREATININE 1.27*  --  1.38*  LATICACIDVEN  --  0.9  --     Estimated Creatinine Clearance: 42.8 mL/min (A) (by C-G formula based on SCr of 1.38 mg/dL (H)).    Allergies  Allergen Reactions  . Lyrica [Pregabalin] Other (See Comments)    MADE PATIENT VERY EMOTIONAL AND WOULD CRY EASILY  . Morphine And Related Nausea And Vomiting  . Tramadol Nausea And Vomiting    Pt cant tolerate this pain med.     Antimicrobials this admission: 4/4 vanc>> 4/4 Zosyn >> Dose adjustments this admission: 4/5 decrease vancomycin dose  Microbiology results: 4/4 blood>> 4/4 wound>> GPC in pairs UCx:   Ulice Dash, PharmD Clinical Pharmacist Pager # [phone]  12/12/2018 10:22 AM

## 2018-12-12 NOTE — Progress Notes (Signed)
Report called to [place] at [facility] 6th floor.

## 2018-12-12 NOTE — Evaluation (Signed)
Physical Therapy Evaluation Patient Details Name: [name] MRN: [id number] DOB: April 08, 1966 Today's Date: 12/12/2018   History of Present Illness  Pt admitted with R foot cellulitis (small puncture wound from childs toy).  Pt with hx of PVD, peripheral neuropathy, DM, CHF, CABG, and CVA  Clinical Impression  Pt admitted as above and presenting with functional mobility limitations 2* R foot pain and ambulatory balance deficits.  Pt should progress well and would benefit from RW for home use.    Follow Up Recommendations No PT follow up    Equipment Recommendations  Rolling walker with 5" wheels    Recommendations for Other Services       Precautions / Restrictions Precautions Precautions: Fall Restrictions Weight Bearing Restrictions: No      Mobility  Bed Mobility Overal bed mobility: Modified Independent                Transfers Overall transfer level: Needs assistance Equipment used: Rolling walker (2 wheeled) Transfers: Sit to/from Stand Sit to Stand: Min guard         General transfer comment: cues for use of UEs to self assist  Ambulation/Gait Ambulation/Gait assistance: Min guard Gait Distance (Feet): 140 Feet Assistive device: Rolling walker (2 wheeled) Gait Pattern/deviations: Step-through pattern;Decreased step length - right;Decreased step length - left;Shuffle;Trunk flexed Gait velocity: decr   General Gait Details: cues for posture and position from [employer]            Wheelchair Mobility    Modified Rankin (Stroke Patients Only)       Balance Overall balance assessment: Needs assistance Sitting-balance support: Feet supported;No upper extremity supported Sitting balance-Leahy Scale: Good     Standing balance support: No upper extremity supported Standing balance-Leahy Scale: Fair                               Pertinent Vitals/Pain Pain Assessment: 0-10 Pain Score: 2  Pain Location: R foot    Home  Living Family/patient expects to be discharged to:: Private residence Living Arrangements: Children Available Help at Discharge: Family;Available PRN/intermittently Type of Home: House Home Access: Ramped entrance     Home Layout: One level Home Equipment: Cane - single point      Prior Function Level of Independence: Independent         Comments: Works with Pension scheme manager        Extremity/Trunk Assessment   Upper Extremity Assessment Upper Extremity Assessment: Overall WFL for tasks assessed    Lower Extremity Assessment Lower Extremity Assessment: RLE deficits/detail;LLE deficits/detail RLE Deficits / Details: Dressings in place RLE Sensation: history of peripheral neuropathy LLE Sensation: history of peripheral neuropathy       Communication   Communication: No difficulties  Cognition Arousal/Alertness: Awake/alert Behavior During Therapy: WFL for tasks assessed/performed Overall Cognitive Status: Within Functional Limits for tasks assessed                                        General Comments      Exercises     Assessment/Plan    PT Assessment Patient needs continued PT services  PT Problem List Decreased activity tolerance;Decreased balance;Decreased mobility;Decreased knowledge of use of DME       PT Treatment Interventions DME instruction;Gait training;Functional mobility training;Therapeutic activities;Patient/family education  PT Goals (Current goals can be found in the Care Plan section)  Acute Rehab PT Goals Patient Stated Goal: Regain IND PT Goal Formulation: With patient Time For Goal Achievement: 12/19/18 Potential to Achieve Goals: Good    Frequency Min 3X/week   Barriers to discharge        Co-evaluation               AM-PAC PT "6 Clicks" Mobility  Outcome Measure Help needed turning from your back to your side while in a flat bed without using bedrails?: None Help needed moving  from lying on your back to sitting on the side of a flat bed without using bedrails?: None Help needed moving to and from a bed to a chair (including a wheelchair)?: A Little Help needed standing up from a chair using your arms (e.g., wheelchair or bedside chair)?: A Little Help needed to walk in hospital room?: A Little Help needed climbing 3-5 steps with a railing? : A Little 6 Click Score: 20    End of Session Equipment Utilized During Treatment: Gait belt Activity Tolerance: Patient tolerated treatment well Patient left: in bed;with call bell/phone within reach Nurse Communication: Mobility status PT Visit Diagnosis: Difficulty in walking, not elsewhere classified (R26.2);Unsteadiness on feet (R26.81)    Time: 2979-8921 PT Time Calculation (min) (ACUTE ONLY): 19 min   Charges:   PT Evaluation $PT Eval Low Complexity: 1 Low          [place] Pager [phone] Office [phone]   BRADSHAW,HUNTER 12/12/2018, 1:14 PM

## 2018-12-12 NOTE — Progress Notes (Signed)
PROGRESS NOTE    [name]  VHQ:[id number] DOB: 1966/05/10 DOA: 12/11/2018 PCP: Mosie Lukes, MD   Brief Narrative:  Patient is a 53 year old female history of uncontrolled diabetes mellitus hemoglobin A1c 10.545 2020, history of coronary artery disease status post CABG 41/32/4401, chronic diastolic CHF, anxiety, depression vascular disease status post left great toe amputation, hypertension, hypothyroidism, peripheral neuropathy presented to [place] ED with complaints of 4-day history of right foot swelling and redness and admitted with concerns for right great toe cellulitis and osteomyelitis.  Orthopedics, Dr. [name] Given consulted.   Assessment & Plan:   Principal Problem:   Abscess of great toe, right Active Problems:   Cellulitis of foot, right   Diabetic infection of right foot ([place])   Essential hypertension, benign   PVD (peripheral vascular disease) (HCC)   Diabetic polyneuropathy associated with type 2 diabetes mellitus (HCC)   Severe protein-calorie malnutrition (HCC)   Diabetic retinopathy ([place])   Type 2 diabetes mellitus with vascular disease ([place])   Coronary artery disease involving native coronary artery of native heart with angina pectoris (HCC)   S/P CABG x 3  1 sepsis secondary to right foot cellulitis/abscess of right great toe/diabetic foot infection/rule out osteomyelitis Patient had presented with sepsis physiology with tachycardia, leukocytosis with a white count of 14,000.  Patient noted to have a wound on the plantar aspect of her right foot ongoing x4 days which was sustained after she stepped on a toy motorcycle.  Patient with poorly controlled type 2 diabetes, peripheral vascular disease, prior history of left great toe osteomyelitis status post amputation.  Plain films which were done with wound involving the plantar surface of the base of the great toe without fracture, dislocation, radiopaque foreign body or radiographic evidence of  osteomyelitis.  CRP elevated at 28.2.  Sed rate elevated at 130.  Patient still with a leukocytosis.  Blood cultures pending.  Due to concerns for probable osteomyelitis orthopedics consulted.  Patient seen in consultation by Dr. [name] Given who is recommending right great toe amputation to be done 12/13/2018.  Orthopedics requesting patient be transferred to [facility] in anticipation of surgery tomorrow.  Continue empiric IV vancomycin IV Zosyn.  Appreciate orthopedics input and recommendations.  2.  Poorly controlled type 2 diabetes mellitus with peripheral vascular disease and diabetic retinopathy and neuropathy Hemoglobin A1c 10.5.  Patient currently on clear liquids and tolerating.  CBG of 200 this morning.  Place on Lantus 25 units daily.  Continue sliding scale insulin.  Continue Neurontin, consult with diabetic coordinator.  3.  Coronary artery disease status post CABG Stable.  Patient denies any recent chest pain.  Continue home regimen of Norvasc, aspirin, Lipitor,, Lopressor.  4.  Hypothyroidism Continue home dose Synthroid.  5.  Hypertension Continue Norvasc, Lopressor, imdur.  6. acute kidney injury Secondary to prerenal azotemia.  Diuretics on hold.  Continue hydration with IV fluids.  Follow.  7.  Nausea and vomiting Felt secondary to problem #1.  Improved clinically.  Tolerating clears.  Would like diet advanced.  Continue IV fluids.  Advance to a carb modified diet.  Follow.  8.  Chronic normocytic anemia Stable.  Follow H&H.  9.  Chronic diastolic heart failure Currently compensated.  Euvolemic.  2D echo from 06/24/2018 with a EF of 50 to 55% with grade 2 diastolic dysfunction.  Continue IV fluids for the next 24 hours and then saline lock.  Monitor for volume overload.  Continue cardiac regimen of aspirin, Lipitor, Lopressor,imdur.  Follow.  10.  Chronic anxiety/depression Continue escitalopram.  11.  Hyperlipidemia Continue statin.  12.  Peripheral vascular  disease status post left great toe amputation Stable.  Fall precautions.  PT/OT.  13.  Protein calorie malnutrition    DVT prophylaxis: Lovenox Code Status: Full Family Communication: Updated patient.  No family at bedside. Disposition Plan: Transfer to [facility] per Ortho recommendations in anticipation of surgical procedure 12/13/2018   Consultants:   Orthopedics: Dr. [name] Given 12/12/2018  Procedures:  Plain films of the right foot 12/11/2018  Antimicrobials:   IV vancomycin 12/11/2018  IV Zosyn 12/11/2018   Subjective: Patient sitting up in bed.  Denies any nausea or emesis.  Tolerating clear liquids.  Would like diet advanced to a solid diet.  Denies any chest pain or shortness of breath.  Objective: Vitals:   12/11/18 1632 12/11/18 2107 12/12/18 0715 12/12/18 1352  BP: (!) 153/134 133/82 (!) 142/78 136/72  Pulse: 64 83 80 81  Resp: 20 18 16 20   Temp: 97.9 F (36.6 C) 97.8 F (36.6 C)  100.1 F (37.8 C)  TempSrc: Oral Oral Oral Oral  SpO2: 98% 100% 97% 99%  Weight: 70.3 kg     Height: 5\' 2"  (1.575 m)       Intake/Output Summary (Last 24 hours) at 12/12/2018 1422 Last data filed at 12/12/2018 1246 Gross per 24 hour  Intake 1150.69 ml  Output -  Net 1150.69 ml   Filed Weights   12/11/18 0940 12/11/18 1632  Weight: 72.6 kg 70.3 kg    Examination:  General exam: Appears calm and comfortable  Respiratory system: Clear to auscultation. Respiratory effort normal. Cardiovascular system: S1 & S2 heard, RRR. No JVD, murmurs, rubs, gallops or clicks. No pedal edema. Gastrointestinal system: Abdomen is nondistended, soft and nontender. No organomegaly or masses felt. Normal bowel sounds heard. Central nervous system: Alert and oriented. No focal neurological deficits. Extremities: Right great toe with some cellulitis and swelling with some fluctuance.  Also noted on the plantar surface of the right great toe.  Skin: No rashes, lesions or ulcers Psychiatry: Judgement  and insight appear normal. Mood & affect appropriate.     Data Reviewed: I have personally reviewed following labs and imaging studies  CBC: Recent Labs  Lab 12/11/18 1003 12/12/18 0523  WBC 14.8* 15.3*  NEUTROABS 12.3* 12.2*  HGB 10.1* 10.6*  HCT 32.5* 33.4*  MCV 85.8 86.8  PLT 458* 628*   Basic Metabolic Panel: Recent Labs  Lab 12/11/18 1003 12/12/18 0523  NA 133* 135  K 4.0 3.7  CL 98 100  CO2 25 25  GLUCOSE 308* 180*  BUN 23* 24*  CREATININE 1.27* 1.38*  CALCIUM 8.3* 8.5*   GFR: Estimated Creatinine Clearance: 42.8 mL/min (A) (by C-G formula based on SCr of 1.38 mg/dL (H)). Liver Function Tests: Recent Labs  Lab 12/11/18 1003  AST 14*  ALT 17  ALKPHOS 92  BILITOT 0.7  PROT 6.9  ALBUMIN 2.4*   No results for input(s): LIPASE, AMYLASE in the last 168 hours. No results for input(s): AMMONIA in the last 168 hours. Coagulation Profile: No results for input(s): INR, PROTIME in the last 168 hours. Cardiac Enzymes: No results for input(s): CKTOTAL, CKMB, CKMBINDEX, TROPONINI in the last 168 hours. BNP (last 3 results) No results for input(s): PROBNP in the last 8760 hours. HbA1C: Recent Labs    12/12/18 0523  HGBA1C 10.5*   CBG: Recent Labs  Lab 12/11/18 1822 12/11/18 2101 12/12/18 0806 12/12/18 1225  GLUCAP 250* 314* 200* 250*   Lipid Profile: No results for input(s): CHOL, HDL, LDLCALC, TRIG, CHOLHDL, LDLDIRECT in the last 72 hours. Thyroid Function Tests: No results for input(s): TSH, T4TOTAL, FREET4, T3FREE, THYROIDAB in the last 72 hours. Anemia Panel: No results for input(s): VITAMINB12, FOLATE, FERRITIN, TIBC, IRON, RETICCTPCT in the last 72 hours. Sepsis Labs: Recent Labs  Lab 12/11/18 1004  LATICACIDVEN 0.9    Recent Results (from the past 240 hour(s))  Wound or Superficial Culture     Status: None (Preliminary result)   Collection Time: 12/11/18 11:02 AM  Result Value Ref Range Status   Specimen Description   Final    WOUND  FOOT RIGHT Performed at [facility], [address], [place], [place] [address]    Special Requests   Final    Normal Performed at [facility], [address] [place], [place] [address]    Gram Stain NO WBC SEEN FEW GRAM POSITIVE COCCI IN PAIRS   Final   Culture   Final    CULTURE REINCUBATED FOR BETTER GROWTH Performed at [facility], [place] [address], [place], [place] [address]    Report Status PENDING  Incomplete         Radiology Studies: Dg Foot Complete Right  Result Date: 12/11/2018 CLINICAL DATA:  History of diabetes, now with wound involving the plantar surface of the right foot. EXAM: RIGHT FOOT COMPLETE - 3+ VIEW COMPARISON:  None. FINDINGS: There is a wound adjacent subcutaneous edema and stranding adjacent to the plantar surface of the base the great toe. No discrete radiopaque foreign body. No discrete areas of osteolysis to suggest osteomyelitis. Distal vascular calcifications. No fracture or dislocation. Joint spaces are preserved. No erosions. IMPRESSION: Wound involving the plantar surface of the base of the great toe without fracture, dislocation, radiopaque foreign body or radiographic evidence osteomyelitis. Electronically Signed   By: Sandi Mariscal M.D.   On: 12/11/2018 10:25        Scheduled Meds: . amLODipine  5 mg Oral Daily  . aspirin  325 mg Oral Daily  . atorvastatin  80 mg Oral q1800  . enoxaparin (LOVENOX) injection  40 mg Subcutaneous Q24H  . escitalopram  10 mg Oral Daily  . gabapentin  300 mg Oral TID  . insulin aspart  0-5 Units Subcutaneous QHS  . insulin aspart  0-9 Units Subcutaneous TID WC  . insulin glargine  25 Units Subcutaneous Daily  . isosorbide mononitrate  15 mg Oral Daily  . levothyroxine  25 mcg Oral Q0600  . metoprolol tartrate  25 mg Oral BID  . senna-docusate  2 tablet Oral BID   Continuous Infusions: . sodium chloride 100 mL/hr at 12/12/18 1246  . piperacillin-tazobactam (ZOSYN)  IV 3.375 g  (12/12/18 1246)  . [START ON 12/13/2018] vancomycin       LOS: 1 day    Time spent: 35 minutes    Irine Seal, MD [facility]  If 7PM-7AM, please contact night-coverage [email or web address] 12/12/2018, 2:22 PM

## 2018-12-12 NOTE — Progress Notes (Signed)
Inpatient Diabetes Program Recommendations  AACE/ADA: New Consensus Statement on Inpatient Glycemic Control (2015)  Target Ranges:  Prepandial:   less than 140 mg/dL      Peak postprandial:   less than 180 mg/dL (1-2 hours)      Critically ill patients:  140 - 180 mg/dL   Lab Results  Component Value Date   GLUCAP 200 (H) 12/12/2018   HGBA1C 10.5 (H) 12/12/2018    Review of Glycemic Control Results for CHARNESE, FEDERICI (MRN [id number]) as of 12/12/2018 09:18  Ref. Range 12/11/2018 21:01 12/12/2018 08:06  Glucose-Capillary Latest Ref Range: 70 - 99 mg/dL 314 (H) 200 (H)   Diabetes history: Type 2 DM Outpatient Diabetes medications: Lantus 50 units BID, Metformin 500 mg BID Current orders for Inpatient glycemic control: Lantus 25 units QD, novolog 0-9 units TID, Novolog 0-5 units QHS  Inpatient Diabetes Program Recommendations:    Noted consult. In agreement with new orders.   Will plan to contact patient on 4/6.   Thanks, Bronson Curb, MSN, RNC-OB Diabetes Coordinator [phone] (8a-5p)

## 2018-12-12 NOTE — Plan of Care (Signed)
  Problem: Health Behavior/Discharge Planning: Goal: Ability to manage health-related needs will improve Outcome: Progressing   Problem: Clinical Measurements: Goal: Ability to maintain clinical measurements within normal limits will improve Outcome: Progressing Goal: Diagnostic test results will improve Outcome: Progressing   Problem: Activity: Goal: Risk for activity intolerance will decrease Outcome: Progressing   Problem: Nutrition: Goal: Adequate nutrition will be maintained Outcome: Progressing   Problem: Pain Managment: Goal: General experience of comfort will improve Outcome: Progressing   Problem: Clinical Measurements: Goal: Ability to avoid or minimize complications of infection will improve Outcome: Progressing   Problem: Skin Integrity: Goal: Skin integrity will improve Outcome: Progressing

## 2018-12-12 NOTE — Progress Notes (Signed)
EMS here to transport patient to [facility].  Family aware of transport.

## 2018-12-13 ENCOUNTER — Inpatient Hospital Stay (HOSPITAL_COMMUNITY): Payer: BLUE CROSS/BLUE SHIELD | Admitting: Anesthesiology

## 2018-12-13 ENCOUNTER — Encounter (HOSPITAL_COMMUNITY)
Admission: EM | Disposition: A | Discharge status: Home / Self Care | Payer: Self-pay | Source: Home / Self Care | Attending: Internal Medicine

## 2018-12-13 ENCOUNTER — Encounter (HOSPITAL_COMMUNITY): Payer: Self-pay | Admitting: Certified Registered"

## 2018-12-13 DIAGNOSIS — A419 Sepsis, unspecified organism: Secondary | ICD-10-CM

## 2018-12-13 HISTORY — PX: AMPUTATION: SHX166

## 2018-12-13 LAB — GLUCOSE, CAPILLARY
Glucose-Capillary: 145 mg/dL — ABNORMAL HIGH (ref 70–99)
Glucose-Capillary: 112 mg/dL — ABNORMAL HIGH (ref 70–99)
Glucose-Capillary: 143 mg/dL — ABNORMAL HIGH (ref 70–99)
Glucose-Capillary: 119 mg/dL — ABNORMAL HIGH (ref 70–99)
Glucose-Capillary: 135 mg/dL — ABNORMAL HIGH (ref 70–99)

## 2018-12-13 LAB — MRSA PCR SCREENING: MRSA by PCR: NEGATIVE

## 2018-12-13 SURGERY — AMPUTATION DIGIT
Anesthesia: Monitor Anesthesia Care | Site: Foot | Laterality: Right

## 2018-12-13 MED ORDER — MIDAZOLAM HCL 2 MG/2ML IJ SOLN
INTRAMUSCULAR | Status: AC
  Administered 2018-12-13: 1 mg via INTRAVENOUS
  Filled 2018-12-13: qty 2

## 2018-12-13 MED ORDER — LIDOCAINE 2% (20 MG/ML) 5 ML SYRINGE
INTRAMUSCULAR | Status: DC | PRN
  Administered 2018-12-13: 40 mg via INTRAVENOUS

## 2018-12-13 MED ORDER — ONDANSETRON HCL 4 MG/2ML IJ SOLN
INTRAMUSCULAR | Status: DC | PRN
  Administered 2018-12-13: 4 mg via INTRAVENOUS

## 2018-12-13 MED ORDER — MEPERIDINE HCL 50 MG/ML IJ SOLN: 6.2500 mg | INTRAMUSCULAR | Status: DC | PRN

## 2018-12-13 MED ORDER — ACETAMINOPHEN 10 MG/ML IV SOLN: 1000.0000 mg | Freq: Once | INTRAVENOUS | Status: DC | PRN

## 2018-12-13 MED ORDER — METHOCARBAMOL 500 MG PO TABS
500.0000 mg | ORAL_TABLET | Freq: Four times a day (QID) | ORAL | Status: DC | PRN
  Filled 2018-12-13: qty 1

## 2018-12-13 MED ORDER — PHENYLEPHRINE 40 MCG/ML (10ML) SYRINGE FOR IV PUSH (FOR BLOOD PRESSURE SUPPORT)
PREFILLED_SYRINGE | INTRAVENOUS | Status: AC
  Filled 2018-12-13: qty 10

## 2018-12-13 MED ORDER — HYDROCODONE-ACETAMINOPHEN 7.5-325 MG PO TABS: 1.0000 | ORAL_TABLET | Freq: Once | ORAL | Status: DC | PRN

## 2018-12-13 MED ORDER — FENTANYL CITRATE (PF) 100 MCG/2ML IJ SOLN
INTRAMUSCULAR | Status: AC
  Administered 2018-12-13: 50 ug via INTRAVENOUS
  Filled 2018-12-13: qty 2

## 2018-12-13 MED ORDER — PROPOFOL 500 MG/50ML IV EMUL
INTRAVENOUS | Status: DC | PRN
  Administered 2018-12-13: 60 ug/kg/min via INTRAVENOUS

## 2018-12-13 MED ORDER — HYDROMORPHONE HCL 1 MG/ML IJ SOLN: 0.5000 mg | INTRAMUSCULAR | Status: DC | PRN

## 2018-12-13 MED ORDER — ONDANSETRON HCL 4 MG/2ML IJ SOLN
4.0000 mg | Freq: Four times a day (QID) | INTRAMUSCULAR | Status: DC | PRN
  Administered 2018-12-14: 23:00:00 4 mg via INTRAVENOUS
  Filled 2018-12-13: qty 2

## 2018-12-13 MED ORDER — METOCLOPRAMIDE HCL 5 MG/ML IJ SOLN: 5.0000 mg | Freq: Three times a day (TID) | INTRAMUSCULAR | Status: DC | PRN

## 2018-12-13 MED ORDER — MIDAZOLAM HCL 2 MG/2ML IJ SOLN
INTRAMUSCULAR | Status: AC
  Filled 2018-12-13: qty 2

## 2018-12-13 MED ORDER — MIDAZOLAM HCL 5 MG/5ML IJ SOLN
INTRAMUSCULAR | Status: DC | PRN
  Administered 2018-12-13: 2 mg via INTRAVENOUS

## 2018-12-13 MED ORDER — CEFAZOLIN SODIUM-DEXTROSE 2-4 GM/100ML-% IV SOLN
2.0000 g | INTRAVENOUS | Status: AC
  Administered 2018-12-13: 2 g via INTRAVENOUS
  Filled 2018-12-13 (×2): qty 100

## 2018-12-13 MED ORDER — BUPIVACAINE-EPINEPHRINE (PF) 0.5% -1:200000 IJ SOLN
INTRAMUSCULAR | Status: DC | PRN
  Administered 2018-12-13: 30 mL via PERINEURAL

## 2018-12-13 MED ORDER — MAGNESIUM CITRATE PO SOLN: 1.0000 | Freq: Once | ORAL | Status: DC | PRN

## 2018-12-13 MED ORDER — OXYCODONE HCL 5 MG PO TABS: 10.0000 mg | ORAL_TABLET | ORAL | Status: DC | PRN

## 2018-12-13 MED ORDER — LIDOCAINE 2% (20 MG/ML) 5 ML SYRINGE
INTRAMUSCULAR | Status: AC
  Filled 2018-12-13: qty 5

## 2018-12-13 MED ORDER — FENTANYL CITRATE (PF) 100 MCG/2ML IJ SOLN
50.0000 ug | Freq: Once | INTRAMUSCULAR | Status: AC
  Administered 2018-12-13: 14:00:00 50 ug via INTRAVENOUS

## 2018-12-13 MED ORDER — OXYCODONE HCL 5 MG PO TABS
5.0000 mg | ORAL_TABLET | ORAL | Status: DC | PRN
  Filled 2018-12-13: qty 2

## 2018-12-13 MED ORDER — BISACODYL 10 MG RE SUPP: 10.0000 mg | Freq: Every day | RECTAL | Status: DC | PRN

## 2018-12-13 MED ORDER — HYDROMORPHONE HCL 1 MG/ML IJ SOLN: 0.2500 mg | INTRAMUSCULAR | Status: DC | PRN

## 2018-12-13 MED ORDER — METHOCARBAMOL 1000 MG/10ML IJ SOLN
500.0000 mg | Freq: Four times a day (QID) | INTRAVENOUS | Status: DC | PRN
  Filled 2018-12-13: qty 5

## 2018-12-13 MED ORDER — DOCUSATE SODIUM 100 MG PO CAPS
100.0000 mg | ORAL_CAPSULE | Freq: Two times a day (BID) | ORAL | Status: DC
  Administered 2018-12-13 – 2018-12-16 (×5): 100 mg via ORAL
  Filled 2018-12-13 (×6): qty 1

## 2018-12-13 MED ORDER — 0.9 % SODIUM CHLORIDE (POUR BTL) OPTIME
TOPICAL | Status: DC | PRN
  Administered 2018-12-13: 15:00:00 1000 mL

## 2018-12-13 MED ORDER — ONDANSETRON HCL 4 MG PO TABS: 4.0000 mg | ORAL_TABLET | Freq: Four times a day (QID) | ORAL | Status: DC | PRN

## 2018-12-13 MED ORDER — DEXAMETHASONE SODIUM PHOSPHATE 4 MG/ML IJ SOLN
INTRAMUSCULAR | Status: DC | PRN
  Administered 2018-12-13: 4 mg via INTRAVENOUS

## 2018-12-13 MED ORDER — FENTANYL CITRATE (PF) 250 MCG/5ML IJ SOLN
INTRAMUSCULAR | Status: AC
  Filled 2018-12-13: qty 5

## 2018-12-13 MED ORDER — MIDAZOLAM HCL 2 MG/2ML IJ SOLN
1.0000 mg | Freq: Once | INTRAMUSCULAR | Status: AC
  Administered 2018-12-13: 14:00:00 1 mg via INTRAVENOUS

## 2018-12-13 MED ORDER — POLYETHYLENE GLYCOL 3350 17 G PO PACK: 17.0000 g | PACK | Freq: Every day | ORAL | Status: DC | PRN

## 2018-12-13 MED ORDER — POVIDONE-IODINE 10 % EX SWAB: 2.0000 "application " | Freq: Once | CUTANEOUS | Status: DC

## 2018-12-13 MED ORDER — METOCLOPRAMIDE HCL 5 MG PO TABS: 5.0000 mg | ORAL_TABLET | Freq: Three times a day (TID) | ORAL | Status: DC | PRN

## 2018-12-13 MED ORDER — CHLORHEXIDINE GLUCONATE 4 % EX LIQD
60.0000 mL | Freq: Once | CUTANEOUS | Status: AC
  Administered 2018-12-13: 4 via TOPICAL
  Filled 2018-12-13: qty 60

## 2018-12-13 MED ORDER — ONDANSETRON HCL 4 MG/2ML IJ SOLN
INTRAMUSCULAR | Status: AC
  Filled 2018-12-13: qty 2

## 2018-12-13 MED ORDER — DEXAMETHASONE SODIUM PHOSPHATE 10 MG/ML IJ SOLN
INTRAMUSCULAR | Status: AC
  Filled 2018-12-13: qty 1

## 2018-12-13 MED ORDER — ACETAMINOPHEN 325 MG PO TABS: 325.0000 mg | ORAL_TABLET | Freq: Four times a day (QID) | ORAL | Status: DC | PRN

## 2018-12-13 MED ORDER — LACTATED RINGERS IV SOLN
INTRAVENOUS | Status: DC | PRN
  Administered 2018-12-13: 14:00:00 via INTRAVENOUS

## 2018-12-13 MED ORDER — LACTATED RINGERS IV SOLN
INTRAVENOUS | Status: AC
  Administered 2018-12-13: 14:00:00 via INTRAVENOUS

## 2018-12-13 MED ORDER — PROMETHAZINE HCL 25 MG/ML IJ SOLN: 6.2500 mg | INTRAMUSCULAR | Status: DC | PRN

## 2018-12-13 MED ORDER — SODIUM CHLORIDE 0.9 % IV SOLN
INTRAVENOUS | Status: DC
  Administered 2018-12-13 – 2018-12-14 (×2): via INTRAVENOUS

## 2018-12-13 MED ORDER — PROPOFOL 10 MG/ML IV BOLUS
INTRAVENOUS | Status: DC | PRN
  Administered 2018-12-13 (×2): 20 mg via INTRAVENOUS

## 2018-12-13 SURGICAL SUPPLY — 33 items
BLADE SURG 21 STRL SS (BLADE) ×2 IMPLANT
BNDG COHESIVE 4X5 TAN STRL (GAUZE/BANDAGES/DRESSINGS) ×2 IMPLANT
BNDG ESMARK 4X9 LF (GAUZE/BANDAGES/DRESSINGS) IMPLANT
BNDG GAUZE ELAST 4 BULKY (GAUZE/BANDAGES/DRESSINGS) ×1 IMPLANT
CONT SPEC 4OZ CLIKSEAL STRL BL (MISCELLANEOUS) ×1 IMPLANT
COVER SURGICAL LIGHT HANDLE (MISCELLANEOUS) ×3 IMPLANT
COVER WAND RF STERILE (DRAPES) ×1 IMPLANT
DRAPE INCISE IOBAN 66X45 STRL (DRAPES) ×1 IMPLANT
DRAPE U-SHAPE 47X51 STRL (DRAPES) ×2 IMPLANT
DRESSING PEEL AND PLAC PRVNA20 (GAUZE/BANDAGES/DRESSINGS) IMPLANT
DRSG ADAPTIC 3X8 NADH LF (GAUZE/BANDAGES/DRESSINGS) IMPLANT
DRSG PAD ABDOMINAL 8X10 ST (GAUZE/BANDAGES/DRESSINGS) ×1 IMPLANT
DRSG PEEL AND PLACE PREVENA 20 (GAUZE/BANDAGES/DRESSINGS) ×2
DURAPREP 26ML APPLICATOR (WOUND CARE) ×2 IMPLANT
ELECT REM PT RETURN 9FT ADLT (ELECTROSURGICAL) ×2
ELECTRODE REM PT RTRN 9FT ADLT (ELECTROSURGICAL) ×1 IMPLANT
GAUZE SPONGE 4X4 12PLY STRL (GAUZE/BANDAGES/DRESSINGS) IMPLANT
GLOVE BIOGEL PI IND STRL 9 (GLOVE) ×1 IMPLANT
GLOVE BIOGEL PI INDICATOR 9 (GLOVE) ×1
GLOVE SURG ORTHO 9.0 STRL STRW (GLOVE) ×2 IMPLANT
GOWN STRL REUS W/ TWL XL LVL3 (GOWN DISPOSABLE) ×2 IMPLANT
GOWN STRL REUS W/TWL XL LVL3 (GOWN DISPOSABLE) ×2
KIT BASIN OR (CUSTOM PROCEDURE TRAY) ×2 IMPLANT
KIT DRSG PREVENA PLUS 7DAY 125 (MISCELLANEOUS) ×1 IMPLANT
KIT TURNOVER KIT B (KITS) ×2 IMPLANT
MANIFOLD NEPTUNE II (INSTRUMENTS) ×1 IMPLANT
NEEDLE 22X1 1/2 (OR ONLY) (NEEDLE) IMPLANT
NS IRRIG 1000ML POUR BTL (IV SOLUTION) ×2 IMPLANT
PACK ORTHO EXTREMITY (CUSTOM PROCEDURE TRAY) ×2 IMPLANT
PAD ARMBOARD 7.5X6 YLW CONV (MISCELLANEOUS) ×4 IMPLANT
SUT ETHILON 2 0 PSLX (SUTURE) ×3 IMPLANT
SYR CONTROL 10ML LL (SYRINGE) IMPLANT
TOWEL OR 17X26 10 PK STRL BLUE (TOWEL DISPOSABLE) ×2 IMPLANT

## 2018-12-13 NOTE — Progress Notes (Signed)
Physical Therapy Treatment Patient Details Name: [name] MRN: [id number] DOB: 06-Dec-1965 Today's Date: 12/13/2018    History of Present Illness Pt admitted with R foot cellulitis (small puncture wound from childs toy).  Pt with hx of PVD, peripheral neuropathy, DM, CHF, CABG, and CVA    PT Comments    Patient seen for mobility progression. This session focused on gait training. Continue to progress as tolerated.   Follow Up Recommendations  No PT follow up     Equipment Recommendations  Rolling walker with 5" wheels    Recommendations for Other Services       Precautions / Restrictions Precautions Precautions: Fall Restrictions Weight Bearing Restrictions: No    Mobility  Bed Mobility Overal bed mobility: Modified Independent                Transfers Overall transfer level: Needs assistance Equipment used: Rolling walker (2 wheeled) Transfers: Sit to/from Stand Sit to Stand: Min guard         General transfer comment: cues for safe hand placement; several attempts before standing  Ambulation/Gait Ambulation/Gait assistance: Min guard Gait Distance (Feet): 100 Feet Assistive device: Rolling walker (2 wheeled) Gait Pattern/deviations: Decreased stance time - right;Decreased step length - left;Step-to pattern;Decreased weight shift to right;Antalgic Gait velocity: decr   General Gait Details: cues for use of bilat UE to offload R LE and for sequencing to start working on possible TDWB/NWB post surgery today; limited by pain; pt unable to demonstrate other weight bearing status other than WBAT at this time   Stairs             Wheelchair Mobility    Modified Rankin (Stroke Patients Only)       Balance Overall balance assessment: Needs assistance Sitting-balance support: Feet supported;No upper extremity supported Sitting balance-Leahy Scale: Good     Standing balance support: No upper extremity supported Standing balance-Leahy  Scale: Fair                              Cognition Arousal/Alertness: Awake/alert Behavior During Therapy: WFL for tasks assessed/performed Overall Cognitive Status: Within Functional Limits for tasks assessed                                        Exercises      General Comments        Pertinent Vitals/Pain Pain Assessment: Faces Faces Pain Scale: Hurts little more Pain Location: R foot with mobility Pain Descriptors / Indicators: Guarding;Sore Pain Intervention(s): Limited activity within patient's tolerance;Monitored during session;Repositioned    Home Living                      Prior Function            PT Goals (current goals can now be found in the care plan section) Acute Rehab PT Goals Patient Stated Goal: Regain IND Progress towards PT goals: Progressing toward goals    Frequency    Min 3X/week      PT Plan Current plan remains appropriate    Co-evaluation              AM-PAC PT "6 Clicks" Mobility   Outcome Measure  Help needed turning from your back to your side while in a flat bed without using bedrails?: None Help needed moving from  lying on your back to sitting on the side of a flat bed without using bedrails?: None Help needed moving to and from a bed to a chair (including a wheelchair)?: A Little Help needed standing up from a chair using your arms (e.g., wheelchair or bedside chair)?: A Little Help needed to walk in hospital room?: A Little Help needed climbing 3-5 steps with a railing? : A Little 6 Click Score: 20    End of Session Equipment Utilized During Treatment: Gait belt Activity Tolerance: Patient tolerated treatment well Patient left: in bed;with call bell/phone within reach;with nursing/sitter in room Nurse Communication: Mobility status PT Visit Diagnosis: Difficulty in walking, not elsewhere classified (R26.2);Unsteadiness on feet (R26.81)     Time: 9509-3267 PT Time  Calculation (min) (ACUTE ONLY): 11 min  Charges:  $Gait Training: 8-22 mins                     Earney Navy, PTA Acute Rehabilitation Services Pager: [phone] Office: [phone]     Darliss Cheney 12/13/2018, 12:44 PM

## 2018-12-13 NOTE — Progress Notes (Signed)
[name], female   DOB: 1966-06-21, 53 y.o.   MRN: [id number] Patient is resting comfortably.  Plan for right foot first ray amputation this afternoon.

## 2018-12-13 NOTE — Interval H&P Note (Signed)
History and Physical Interval Note:  12/13/2018 7:12 AM  [name]  has presented today for surgery, with the diagnosis of abscess of right great toe.  The various methods of treatment have been discussed with the patient and family. After consideration of risks, benefits and other options for treatment, the patient has consented to  Procedure(s): AMPUTATION RIGHT GREAT TOE (Right) as a surgical intervention.  The patient's history has been reviewed, patient examined, no change in status, stable for surgery.  I have reviewed the patient's chart and labs.  Questions were answered to the patient's satisfaction.     [name]

## 2018-12-13 NOTE — Progress Notes (Signed)
Orthopedic Tech Progress Note Patient Details:  [name] 04/23/1966 [id number]  Ortho Devices Type of Ortho Device: Postop shoe/boot Ortho Device/Splint Location: LRE Ortho Device/Splint Interventions: Adjustment, Application, Ordered   Post Interventions Patient Tolerated: Well Instructions Provided: Care of device, Adjustment of device   Janit Pagan 12/13/2018, 5:14 PM

## 2018-12-13 NOTE — Op Note (Signed)
12/13/2018  3:26 PM  PATIENT:  [name]    PRE-OPERATIVE DIAGNOSIS:  abscess of right great toe  POST-OPERATIVE DIAGNOSIS:  Same  PROCEDURE:  AMPUTATION RIGHT foot first ray.,  LOCAL REARRANGEMENT OF TISSUE FOR WOUND CLOSURE 9cm x 3cm,  VAC APPLICATION  SURGEON:  [name], MD  PHYSICIAN ASSISTANT:None ANESTHESIA:   General  PREOPERATIVE INDICATIONS:  [name] is a  53 y.o. female with a diagnosis of abscess of right great toe who failed conservative measures and elected for surgical management.    The risks benefits and alternatives were discussed with the patient preoperatively including but not limited to the risks of infection, bleeding, nerve injury, cardiopulmonary complications, the need for revision surgery, among others, and the patient was willing to proceed.  OPERATIVE IMPLANTS: Praveena wound VAC  @ENCIMAGES @  OPERATIVE FINDINGS: Patient abscess extended to the operative margins.  The soft tissue from the operative margins as well as the purulence was sent for cultures.  Would recommend 4 weeks of antibiotics depending on culture sensitivities.  OPERATIVE PROCEDURE: Patient was brought to the operating room after undergoing a regional block.  After adequate levels anesthesia were obtained patient's right lower extremity was prepped using DuraPrep draped in a sterile field a timeout was called.  A racquet incision was made around the ulcerative tissue and the great toe.  This was carried down and the red and soft tissue and ulcer were resected in one block of tissue through the base of the first metatarsal.  Patient had purulent abscess and necrotic tissue circumferentially around the first metatarsal head.  This tissue and abscess was sent for cultures.  The wound was irrigated with normal saline hemostasis was obtained with electrocautery.  Wound edges were debrided back to healthy viable tissue.  There was minimal petechial bleeding despite patient  having a strong dorsalis pedis pulse.  Local tissue rearrangement was used to close the wound 9 x 3 cm with 2-0 nylon.  A Praveena wound VAC was applied this had a good suction fit with the patient's venous insufficiency compression wrap was wrapped from the metatarsal heads up to the tibial tubercle.   DISCHARGE PLANNING:  Antibiotic duration: Continue IV antibiotics pending cultures would then use 4 weeks hopefully of oral antibiotics.  Weightbearing: Nonweightbearing on the right for 4 weeks  Pain medication: Prescription for opioid pathway  Dressing care/ Wound VAC: Continue wound VAC for 1 week  Ambulatory devices: Walker  Discharge to: Home  Follow-up: In the office 1 week post operative.

## 2018-12-13 NOTE — Transfer of Care (Signed)
Immediate Anesthesia Transfer of Care Note  Patient: [name]  Procedure(s) Performed: AMPUTATION RIGHT GREAT TOE, LOCAL RELAOCATION OF TISSUE FOR WOUND CLOSURE, VAC APPLICATION (Right )  Patient Location: PACU  Anesthesia Type:MAC and Regional  Level of Consciousness: awake and patient cooperative  Airway & Oxygen Therapy: Patient Spontanous Breathing and Patient connected to nasal cannula oxygen  Post-op Assessment: Report given to RN and Post -op Vital signs reviewed and stable  Post vital signs: Reviewed and stable  Last Vitals:  Vitals Value Taken Time  BP 118/59 12/13/2018  3:17 PM  Temp 36.6 C 12/13/2018  3:16 PM  Pulse 79 12/13/2018  3:18 PM  Resp 38 12/13/2018  3:18 PM  SpO2 98 % 12/13/2018  3:18 PM  Vitals shown include unvalidated device data.  Last Pain:  Vitals:   12/13/18 1355  TempSrc: Oral  PainSc: 0-No pain      Patients Stated Pain Goal: 2 (16/10/96 0454)  Complications: No apparent anesthesia complications

## 2018-12-13 NOTE — Anesthesia Procedure Notes (Signed)
Anesthesia Regional Block: Popliteal block   Pre-Anesthetic Checklist: ,, timeout performed, Correct Patient, Correct Site, Correct Laterality, Correct Procedure, Correct Position, site marked, Risks and benefits discussed,  Surgical consent,  Pre-op evaluation,  At surgeon's request and post-op pain management  Laterality: Right  Prep: chloraprep       Needles:  Injection technique: Single-shot  Needle Type: Echogenic Stimulator Needle          Additional Needles:   Procedures:, nerve stimulator,,,,,,,   Nerve Stimulator or Paresthesia:  Response: plantar flexion of foot, 0.45 mA,   Additional Responses:   Narrative:  Start time: 12/13/2018 1:40 PM End time: 12/13/2018 1:47 PM Injection made incrementally with aspirations every 5 mL.  Performed by: Personally  Anesthesiologist: Albertha Ghee, MD  Additional Notes: Functioning IV was confirmed and monitors were applied.  A 54mm 21ga Arrow echogenic stimulator needle was used. Sterile prep and drape,hand hygiene and sterile gloves were used.  Negative aspiration and negative test dose prior to incremental administration of local anesthetic. The patient tolerated the procedure well.  Ultrasound guidance: relevent anatomy identified, needle position confirmed, local anesthetic spread visualized around nerve(s), vascular puncture avoided.  Image printed for medical record.

## 2018-12-13 NOTE — Progress Notes (Signed)
Notified [facility] of patient's temperature 100.1. Nursing will continue to monitor.

## 2018-12-13 NOTE — Progress Notes (Signed)
 PROGRESS NOTE  [name] MRN:[id number] DOB: 04/07/1966 DOA: 12/11/2018 PCP: Blyth, Stacey A, MD  HPI/Recap of past 24 hours: [name] is a 52 y.o. female with medical history significant for uncontrolled type 2 diabetes, peripheral vascular disease status post left great toe amputation, hypertension, hypothyroidism, chronic depression, peripheral neuropathy, who was admitted to the hospital service on 4/4 after presented to the emergency room with 4 days of right foot swelling and redness and found to have Sepsis from cellulitis of right foot secondary to right great toe abscess with suspected osteomyelitis.Seen by orthopedic surgery and given risk factors including severe protein calorie malnutrition, felt best for amputation of right great toe.  Patient underwent this on 4/6  Assessment/Plan: Principal Problem: Sepsis from underlying abscess of great toe, right with secondary right foot cellulitis and underlying history of peripheral vascular disease: For amputation today.  Continue broad-spectrum antibiotics.  Patient presented with sepsis on admission and met criteria given leukocytosis, infected toe source, tachycardia and fever. Active Problems:    Diabetic polyneuropathy associated with type 2 diabetes mellitus (HCC) uncontrolled also with diabetic retinopathy: While surgery, n.p.o. and on sliding scale.  A1c notes very poor control at 10.5    Severe protein-calorie malnutrition (HCC): We will have nutrition see.    Coronary artery disease involving native coronary artery of native heart with angina pectoris (HCC)  Code Status: Full code  Family Communication: Left message for family  Disposition Plan: Discharge once cleared by orthopedics   Consultants:  Orthopedic surgery  Procedures:  Planned right great toe amputation  Antimicrobials:  IV Zosyn and vancomycin  DVT prophylaxis: Lovenox   Objective: Vitals:   12/13/18 1350 12/13/18 1355  BP: (!)  130/59 (!) 148/62  Pulse: 81 80  Resp:    Temp:  99.1 F (37.3 C)  SpO2: 97% 100%    Intake/Output Summary (Last 24 hours) at 12/13/2018 1451 Last data filed at 12/13/2018 0523 Gross per 24 hour  Intake 867 ml  Output -  Net 867 ml   Filed Weights   12/11/18 0940 12/11/18 1632  Weight: 72.6 kg 70.3 kg   Body mass index is 28.35 kg/m.  Exam: Seen before surgery  General: Alert and oriented x3, no acute distress  HEENT: Normocephalic and atraumatic, mucous memories slightly dry  Neck: Supple, no JVD  Cardiovascular: Regular rate and rhythm, S1-S2  Respiratory: Clear to auscultation bilaterally  Abdomen: Soft, nontender, nondistended, positive bowel sounds  Musculoskeletal: No clubbing or cyanosis or edema, right foot with surrounding erythema and swelling.  Noted some mild ulceration  Skin: As described above  Neuro: Decreased sensation from peripheral neuropathy lower extremities  Psychiatry: Answers questions appropriately, no evidence of psychoses   Data Reviewed: CBC: Recent Labs  Lab 12/11/18 1003 12/12/18 0523  WBC 14.8* 15.3*  NEUTROABS 12.3* 12.2*  HGB 10.1* 10.6*  HCT 32.5* 33.4*  MCV 85.8 86.8  PLT 458* 497*   Basic Metabolic Panel: Recent Labs  Lab 12/11/18 1003 12/12/18 0523  NA 133* 135  K 4.0 3.7  CL 98 100  CO2 25 25  GLUCOSE 308* 180*  BUN 23* 24*  CREATININE 1.27* 1.38*  CALCIUM 8.3* 8.5*   GFR: Estimated Creatinine Clearance: 42.8 mL/min (A) (by C-G formula based on SCr of 1.38 mg/dL (H)). Liver Function Tests: Recent Labs  Lab 12/11/18 1003  AST 14*  ALT 17  ALKPHOS 92  BILITOT 0.7  PROT 6.9  ALBUMIN 2.4*   No results for input(s): LIPASE,   AMYLASE in the last 168 hours. No results for input(s): AMMONIA in the last 168 hours. Coagulation Profile: No results for input(s): INR, PROTIME in the last 168 hours. Cardiac Enzymes: No results for input(s): CKTOTAL, CKMB, CKMBINDEX, TROPONINI in the last 168 hours. BNP  (last 3 results) No results for input(s): PROBNP in the last 8760 hours. HbA1C: Recent Labs    12/12/18 0523  HGBA1C 10.5*   CBG: Recent Labs  Lab 12/12/18 1225 12/12/18 1742 12/12/18 2225 12/13/18 0800 12/13/18 1204  GLUCAP 250* 102* 166* 145* 112*   Lipid Profile: No results for input(s): CHOL, HDL, LDLCALC, TRIG, CHOLHDL, LDLDIRECT in the last 72 hours. Thyroid Function Tests: No results for input(s): TSH, T4TOTAL, FREET4, T3FREE, THYROIDAB in the last 72 hours. Anemia Panel: No results for input(s): VITAMINB12, FOLATE, FERRITIN, TIBC, IRON, RETICCTPCT in the last 72 hours. Urine analysis:    Component Value Date/Time   COLORURINE YELLOW 06/26/2018 1937   APPEARANCEUR HAZY (A) 06/26/2018 1937   LABSPEC 1.014 06/26/2018 1937   PHURINE 5.0 06/26/2018 1937   GLUCOSEU NEGATIVE 06/26/2018 1937   GLUCOSEU NEGATIVE 05/21/2015 0831   HGBUR SMALL (A) 06/26/2018 1937   BILIRUBINUR NEGATIVE 06/26/2018 1937   KETONESUR NEGATIVE 06/26/2018 1937   PROTEINUR 100 (A) 06/26/2018 1937   UROBILINOGEN 0.2 05/21/2015 0831   NITRITE NEGATIVE 06/26/2018 1937   LEUKOCYTESUR LARGE (A) 06/26/2018 1937   Sepsis Labs: @LABRCNTIP(procalcitonin:4,lacticidven:4)  ) Recent Results (from the past 240 hour(s))  Culture, blood (routine x 2)     Status: None (Preliminary result)   Collection Time: 12/11/18 10:27 AM  Result Value Ref Range Status   Specimen Description   Final    BLOOD BLOOD RIGHT ARM Performed at [facility] [place], [address] [place], [place] [address]    Special Requests   Final    BOTTLES DRAWN AEROBIC AND ANAEROBIC Blood Culture adequate volume Performed at [facility] [place], [address] [place], [place] [address]    Culture   Final    NO GROWTH 2 DAYS Performed at [facility], [address] [place], [place] [address]    Report Status PENDING  Incomplete  Culture, blood (routine x 2)     Status: None (Preliminary result)   Collection  Time: 12/11/18 10:44 AM  Result Value Ref Range Status   Specimen Description   Final    BLOOD BLOOD LEFT HAND Performed at [facility] [place], [address] [place], [place] [address]    Special Requests   Final    BOTTLES DRAWN AEROBIC ONLY Blood Culture results may not be optimal due to an inadequate volume of blood received in culture bottles Performed at [facility] [place], [address] [place], [place] [address]    Culture   Final    NO GROWTH 2 DAYS Performed at [facility], [address] [place], [place] [address]    Report Status PENDING  Incomplete  Wound or Superficial Culture     Status: None (Preliminary result)   Collection Time: 12/11/18 11:02 AM  Result Value Ref Range Status   Specimen Description   Final    WOUND FOOT RIGHT Performed at [facility], [address] , [place] [address]    Special Requests   Final    Normal Performed at [facility] [place], [address] [place], [place] [address]    Gram Stain NO WBC SEEN FEW   GRAM POSITIVE COCCI IN PAIRS   Final   Culture   Final    MODERATE ENTEROCOCCUS FAECALIS MODERATE STAPHYLOCOCCUS SIMULANS SUSCEPTIBILITIES TO FOLLOW Performed at [facility], [address] [place], [place] [address]    Report Status PENDING  Incomplete  MRSA PCR Screening     Status: None   Collection Time: 12/13/18  5:23 AM  Result Value Ref Range Status   MRSA by PCR NEGATIVE NEGATIVE Final    Comment:        The GeneXpert MRSA Assay (FDA approved for NASAL specimens only), is one component of a comprehensive MRSA colonization surveillance program. It is not intended to diagnose MRSA infection nor to guide or monitor treatment for MRSA infections. Performed at [facility], [address] [place], [place] [address]       Studies: No results found.  Scheduled Meds: . [MAR Hold] amLODipine  5 mg Oral Daily  . [MAR Hold] aspirin  325 mg Oral Daily  . [MAR  Hold] atorvastatin  80 mg Oral q1800  . [MAR Hold] enoxaparin (LOVENOX) injection  40 mg Subcutaneous Q24H  . [MAR Hold] escitalopram  10 mg Oral Daily  . [MAR Hold] gabapentin  300 mg Oral TID  . [MAR Hold] insulin aspart  0-5 Units Subcutaneous QHS  . [MAR Hold] insulin aspart  0-9 Units Subcutaneous TID WC  . [MAR Hold] insulin glargine  25 Units Subcutaneous Daily  . [MAR Hold] isosorbide mononitrate  15 mg Oral Daily  . [MAR Hold] levothyroxine  25 mcg Oral Q0600  . [MAR Hold] metoprolol tartrate  25 mg Oral BID  . povidone-iodine  2 application Topical Once  . [MAR Hold] senna-docusate  2 tablet Oral BID    Continuous Infusions: .  ceFAZolin (ANCEF) IV    . lactated ringers 10 mL/hr at 12/13/18 1357  . [MAR Hold] piperacillin-tazobactam (ZOSYN)  IV 3.375 g (12/13/18 1213)  . [MAR Hold] vancomycin 750 mg (12/13/18 0120)     LOS: 2 days     Sendil K Krishnan, MD [facility]  To reach me or the doctor on call, go to: [email or web address] Password [email or web address]  12/13/2018, 2:51 PM   

## 2018-12-13 NOTE — Anesthesia Procedure Notes (Signed)
Procedure Name: MAC Date/Time: 12/13/2018 2:50 PM Performed by: Orlie Dakin, CRNA Pre-anesthesia Checklist: Patient identified, Emergency Drugs available, Suction available and Patient being monitored Patient Re-evaluated:Patient Re-evaluated prior to induction Oxygen Delivery Method: Nasal cannula Preoxygenation: Pre-oxygenation with 100% oxygen Induction Type: IV induction

## 2018-12-13 NOTE — H&P (View-Only) (Signed)
[name], female   DOB: 1966/04/05, 52 y.o.   MRN: [id number] Patient is resting comfortably.  Plan for right foot first ray amputation this afternoon.

## 2018-12-13 NOTE — Progress Notes (Signed)
Notified [facility] of patient's temperature 100.7 per order. Nursing will continue to monitor.

## 2018-12-14 ENCOUNTER — Encounter (HOSPITAL_COMMUNITY): Payer: Self-pay | Admitting: Orthopedic Surgery

## 2018-12-14 LAB — GLUCOSE, CAPILLARY
Glucose-Capillary: 208 mg/dL — ABNORMAL HIGH (ref 70–99)
Glucose-Capillary: 279 mg/dL — ABNORMAL HIGH (ref 70–99)
Glucose-Capillary: 405 mg/dL — ABNORMAL HIGH (ref 70–99)
Glucose-Capillary: 157 mg/dL — ABNORMAL HIGH (ref 70–99)

## 2018-12-14 LAB — CBC
HCT: 29.9 % — ABNORMAL LOW (ref 36.0–46.0)
Hemoglobin: 9.4 g/dL — ABNORMAL LOW (ref 12.0–15.0)
MCH: 26.3 pg (ref 26.0–34.0)
MCHC: 31.4 g/dL (ref 30.0–36.0)
MCV: 83.8 fL (ref 80.0–100.0)
Platelets: 531 10*3/uL — ABNORMAL HIGH (ref 150–400)
RBC: 3.57 MIL/uL — ABNORMAL LOW (ref 3.87–5.11)
RDW: 13.2 % (ref 11.5–15.5)
WBC: 14.6 10*3/uL — ABNORMAL HIGH (ref 4.0–10.5)
nRBC: 0 % (ref 0.0–0.2)

## 2018-12-14 LAB — AEROBIC CULTURE W GRAM STAIN (SUPERFICIAL SPECIMEN)
Gram Stain: NONE SEEN
Special Requests: NORMAL

## 2018-12-14 LAB — BASIC METABOLIC PANEL
Anion gap: 11 (ref 5–15)
BUN: 22 mg/dL — ABNORMAL HIGH (ref 6–20)
CO2: 23 mmol/L (ref 22–32)
Calcium: 8.3 mg/dL — ABNORMAL LOW (ref 8.9–10.3)
Chloride: 103 mmol/L (ref 98–111)
Creatinine, Ser: 1.41 mg/dL — ABNORMAL HIGH (ref 0.44–1.00)
GFR calc Af Amer: 48 mL/min — ABNORMAL LOW (ref >60)
GFR calc non Af Amer: 42 mL/min — ABNORMAL LOW (ref >60)
Glucose, Bld: 215 mg/dL — ABNORMAL HIGH (ref 70–99)
Potassium: 3.7 mmol/L (ref 3.5–5.1)
Sodium: 137 mmol/L (ref 135–145)

## 2018-12-14 MED ORDER — JUVEN PO PACK
1.0000 | PACK | Freq: Two times a day (BID) | ORAL | Status: DC
  Administered 2018-12-15 – 2018-12-16 (×4): 1 via ORAL
  Filled 2018-12-14 (×4): qty 1

## 2018-12-14 MED ORDER — INSULIN GLARGINE 100 UNIT/ML ~~LOC~~ SOLN
28.0000 [IU] | Freq: Every day | SUBCUTANEOUS | Status: DC
  Administered 2018-12-15: 10:00:00 28 [IU] via SUBCUTANEOUS
  Filled 2018-12-14 (×2): qty 0.28

## 2018-12-14 MED ORDER — INSULIN ASPART 100 UNIT/ML ~~LOC~~ SOLN
0.0000 [IU] | Freq: Three times a day (TID) | SUBCUTANEOUS | Status: DC
  Administered 2018-12-14: 18:00:00 30 [IU] via SUBCUTANEOUS
  Administered 2018-12-15: 13:00:00 7 [IU] via SUBCUTANEOUS
  Administered 2018-12-15 – 2018-12-16 (×3): 4 [IU] via SUBCUTANEOUS

## 2018-12-14 MED ORDER — AMOXICILLIN-POT CLAVULANATE 875-125 MG PO TABS
1.0000 | ORAL_TABLET | Freq: Two times a day (BID) | ORAL | Status: DC
  Administered 2018-12-14 – 2018-12-16 (×4): 1 via ORAL
  Filled 2018-12-14 (×4): qty 1

## 2018-12-14 MED ORDER — INSULIN ASPART 100 UNIT/ML ~~LOC~~ SOLN: 0.0000 [IU] | Freq: Every day | SUBCUTANEOUS | Status: DC

## 2018-12-14 MED ORDER — ADULT MULTIVITAMIN W/MINERALS CH
1.0000 | ORAL_TABLET | Freq: Every day | ORAL | Status: DC
  Administered 2018-12-14 – 2018-12-16 (×3): 1 via ORAL
  Filled 2018-12-14 (×3): qty 1

## 2018-12-14 NOTE — Progress Notes (Signed)
Inpatient Diabetes Program Recommendations  AACE/ADA: New Consensus Statement on Inpatient Glycemic Control (2015)  Target Ranges:  Prepandial:   less than 140 mg/dL      Peak postprandial:   less than 180 mg/dL (1-2 hours)      Critically ill patients:  140 - 180 mg/dL   Lab Results  Component Value Date   GLUCAP 208 (H) 12/14/2018   HGBA1C 10.5 (H) 12/12/2018    Review of Glycemic Control  Diabetes history: DM 2 Outpatient Diabetes medications: Lantus 50 units BID, Metformin 500 mg BID   Inpatient Diabetes Program Recommendations:    Spoke with patient over the phone in regards to A1c level and glucose control at home. Patient reports seeing Dr. Charlett Blake for DM management and last saw her 3 months ago. No changes were made at that visit. Patient said she did check her levels but could not remember what they were. Patient reports not knowing what an A1c is. Discussed what an A1c is and what her value was this admission, 10.5%. Discussed glucose and A1c goals.   Discussed with patient the current regimen she is on compared to outpatient. Patient mentioned she could make a few changes to her diet, however she mentioned her "body was broke." She told me her husband passed away 5 months ago that also caused a heart attack and now her surgery. She described to me how she cried when she first gets up and cries everyday. I sympathized with patient and express my condolences.   Patient reports difficulty with following up with her PCP. She mentioned the office stated to try again in a couple of months. Patient will need insulin refills at time of d/c.   Thanks,  Tama Headings RN, MSN, BC-ADM Inpatient Diabetes Coordinator Team Pager [phone] (8a-5p)

## 2018-12-14 NOTE — Progress Notes (Signed)
Pt's blood sugar is 405. Provider notified.

## 2018-12-14 NOTE — Progress Notes (Signed)
PROGRESS NOTE  [name] UKG:[id number] DOB: February 12, 1966 DOA: 12/11/2018 PCP: Mosie Lukes, MD  HPI/Recap of past 24 hours: [name] is a 53 y.o. female with medical history significant for uncontrolled type 2 diabetes, peripheral vascular disease status post left great toe amputation, hypertension, hypothyroidism, chronic depression, peripheral neuropathy, who was admitted to the hospital service on 4/4 after presented to the emergency room with 4 days of right foot swelling and redness and found to have Sepsis from cellulitis of right foot secondary to right great toe abscess with suspected osteomyelitis.Seen by orthopedic surgery and given risk factors including severe protein calorie malnutrition, felt best for amputation of right great toe.  Patient underwent this on 4/6  Today, patient is doing well with no complaints.  Was able to ambulate around the unit earlier.  Wound culture positive for penicillin sensitive staph and penicillin sensitive enterococcus.  Antibiotics changed to Augmentin.  Assessment/Plan: Principal Problem: Sepsis from underlying abscess of great toe, right with secondary right foot cellulitis and underlying history of peripheral vascular disease:   Patient presented with sepsis on admission and met criteria given leukocytosis, infected toe source, tachycardia and fever.  Sepsis now resolved, status post amputation.  Antibiotics changed to p.o.  Monitor white blood cell count and if improved, can likely discharge home tomorrow Active Problems:    Diabetic polyneuropathy associated with type 2 diabetes mellitus ([place]) uncontrolled also with diabetic retinopathy:   A1c notes very poor control at 10.5.  Lantus increased and sliding scale changed to resistant.    Severe protein-calorie malnutrition ([place]): We will have nutrition see.    Coronary artery disease involving native coronary artery of native heart with angina pectoris [facility])  Code Status: Full  code  Family Communication: Patient has been passed away.  Declines for me to call any other members of family  Disposition Plan: Discharge hopefully tomorrow as white count continues to trend downward on p.o. antibiotics   Consultants:  Orthopedic surgery  Procedures:  Status post right great toe amputation  Antimicrobials:  IV Zosyn and vancomycin 4/5-4/7  Augmentin 4/7-present  DVT prophylaxis: Lovenox   Objective: Vitals:   12/13/18 2346 12/14/18 0412  BP: 126/76 (!) 142/68  Pulse: 68 62  Resp: 13 14  Temp: 97.8 F (36.6 C) (!) 97.5 F (36.4 C)  SpO2: 100% 100%    Intake/Output Summary (Last 24 hours) at 12/14/2018 1336 Last data filed at 12/14/2018 0900 Gross per 24 hour  Intake 5093 ml  Output -  Net 5093 ml   Filed Weights   12/11/18 0940 12/11/18 1632 12/14/18 0500  Weight: 72.6 kg 70.3 kg 72.5 kg   Body mass index is 29.23 kg/m.  Exam:   General: Alert and oriented x3, no acute distress  HEENT: Normocephalic and atraumatic, mucous memories slightly dry  Neck: Supple, no JVD  Cardiovascular: Regular rate and rhythm, S1-S2  Respiratory: Clear to auscultation bilaterally  Abdomen: Soft, nontender, nondistended, positive bowel sounds  Musculoskeletal: No clubbing or cyanosis or edema, with surrounding cellulitis bandaged, wound VAC in place  Skin: As described above  Neuro: Full sensory neuropathy in lower extremities  Psychiatry: Answers questions appropriately, no evidence of psychoses   Data Reviewed: CBC: Recent Labs  Lab 12/11/18 1003 12/12/18 0523 12/14/18 0445  WBC 14.8* 15.3* 14.6*  NEUTROABS 12.3* 12.2*  --   HGB 10.1* 10.6* 9.4*  HCT 32.5* 33.4* 29.9*  MCV 85.8 86.8 83.8  PLT 458* 497* 762*   Basic Metabolic Panel:  Recent Labs  Lab 12/11/18 1003 12/12/18 0523 12/14/18 0445  NA 133* 135 137  K 4.0 3.7 3.7  CL 98 100 103  CO2 _0 GLUCOSE 308* 180* 215*  BUN 23* 24* 22*  CREATININE 1.27* 1.38* 1.41*   CALCIUM 8.3* 8.5* 8.3*   GFR: Estimated Creatinine Clearance: 42.1 mL/min (A) (by C-G formula based on SCr of 1.41 mg/dL (H)). Liver Function Tests: Recent Labs  Lab 12/11/18 1003  AST 14*  ALT 17  ALKPHOS 92  BILITOT 0.7  PROT 6.9  ALBUMIN 2.4*   No results for input(s): LIPASE, AMYLASE in the last 168 hours. No results for input(s): AMMONIA in the last 168 hours. Coagulation Profile: No results for input(s): INR, PROTIME in the last 168 hours. Cardiac Enzymes: No results for input(s): CKTOTAL, CKMB, CKMBINDEX, TROPONINI in the last 168 hours. BNP (last 3 results) No results for input(s): PROBNP in the last 8760 hours. HbA1C: Recent Labs    12/12/18 0523  HGBA1C 10.5*   CBG: Recent Labs  Lab 12/13/18 1519 12/13/18 1712 12/13/18 2206 12/14/18 0742 12/14/18 1146  GLUCAP 143* 119* 135* 208* 279*   Lipid Profile: No results for input(s): CHOL, HDL, LDLCALC, TRIG, CHOLHDL, LDLDIRECT in the last 72 hours. Thyroid Function Tests: No results for input(s): TSH, T4TOTAL, FREET4, T3FREE, THYROIDAB in the last 72 hours. Anemia Panel: No results for input(s): VITAMINB12, FOLATE, FERRITIN, TIBC, IRON, RETICCTPCT in the last 72 hours. Urine analysis:    Component Value Date/Time   COLORURINE YELLOW 06/26/2018 1937   APPEARANCEUR HAZY (A) 06/26/2018 1937   LABSPEC 1.014 06/26/2018 1937   PHURINE 5.0 06/26/2018 1937   GLUCOSEU NEGATIVE 06/26/2018 1937   GLUCOSEU NEGATIVE 05/21/2015 0831   HGBUR SMALL (A) 06/26/2018 1937   BILIRUBINUR NEGATIVE 06/26/2018 [place] NEGATIVE 06/26/2018 1937   PROTEINUR 100 (A) 06/26/2018 1937   UROBILINOGEN 0.2 05/21/2015 0831   NITRITE NEGATIVE 06/26/2018 1937   LEUKOCYTESUR LARGE (A) 06/26/2018 1937   Sepsis Labs: _1 (procalcitonin:4,lacticidven:4)  ) Recent Results (from the past 240 hour(s))  Culture, blood (routine x 2)     Status: None (Preliminary result)   Collection Time: 12/11/18 10:27 AM  Result Value Ref  Range Status   Specimen Description   Final    BLOOD BLOOD RIGHT ARM Performed at [facility], [address] [place], [place] [address]    Special Requests   Final    BOTTLES DRAWN AEROBIC AND ANAEROBIC Blood Culture adequate volume Performed at [facility], [address] [place], [place] [address]    Culture   Final    NO GROWTH 3 DAYS Performed at [facility], [place] [address] [place], [place] [address]    Report Status PENDING  Incomplete  Culture, blood (routine x 2)     Status: None (Preliminary result)   Collection Time: 12/11/18 10:44 AM  Result Value Ref Range Status   Specimen Description   Final    BLOOD BLOOD LEFT HAND Performed at [facility], [address] [place], [place] [address]    Special Requests   Final    BOTTLES DRAWN AEROBIC ONLY Blood Culture results may not be optimal due to an inadequate volume of blood received in culture bottles Performed at [facility], [address] [place], [place] [address]    Culture   Final    NO GROWTH 3 DAYS Performed at [facility], [address] [place], [place] [address]    Report Status PENDING  Incomplete  Wound or Superficial Culture     Status: None   Collection Time: 12/11/18 11:02 AM  Result Value Ref Range Status   Specimen Description   Final    WOUND FOOT RIGHT Performed at [facility], [place] [address], [place], [place] [address]    Special Requests   Final    Normal Performed at [facility], [address] [place], [place] [address]    Gram Stain   Final    NO WBC SEEN FEW GRAM POSITIVE COCCI IN PAIRS Performed at [facility], [place] [address] [place], [place] [address]    Culture   Final    MODERATE ENTEROCOCCUS FAECALIS MODERATE STAPHYLOCOCCUS SIMULANS    Report Status 12/14/2018 FINAL  Final   Organism ID, Bacteria ENTEROCOCCUS FAECALIS  Final   Organism ID, Bacteria STAPHYLOCOCCUS SIMULANS  Final       Susceptibility   Enterococcus faecalis - MIC*    AMPICILLIN <=2 SENSITIVE Sensitive     VANCOMYCIN 1 SENSITIVE Sensitive     GENTAMICIN SYNERGY SENSITIVE Sensitive     * MODERATE ENTEROCOCCUS FAECALIS   Staphylococcus simulans - MIC*    CIPROFLOXACIN <=0.5 SENSITIVE Sensitive     ERYTHROMYCIN >=8 RESISTANT Resistant     GENTAMICIN <=0.5 SENSITIVE Sensitive     OXACILLIN <=0.25 SENSITIVE Sensitive     TETRACYCLINE >=16 RESISTANT Resistant     VANCOMYCIN 1 SENSITIVE Sensitive     TRIMETH/SULFA <=10 SENSITIVE Sensitive     CLINDAMYCIN <=0.25 SENSITIVE Sensitive     RIFAMPIN <=0.5 SENSITIVE Sensitive     Inducible Clindamycin NEGATIVE Sensitive     * MODERATE STAPHYLOCOCCUS SIMULANS  MRSA PCR Screening     Status: None   Collection Time: 12/13/18  5:23 AM  Result Value Ref Range Status   MRSA by PCR NEGATIVE NEGATIVE Final    Comment:        The GeneXpert MRSA Assay (FDA approved for NASAL specimens only), is one component of a comprehensive MRSA colonization surveillance program. It is not intended to diagnose MRSA infection nor to guide or monitor treatment for MRSA infections. Performed at [facility], [place] [address], [place], [place] [address]   Aerobic/Anaerobic Culture (surgical/deep wound)     Status: None (Preliminary result)   Collection Time: 12/13/18  2:50 PM  Result Value Ref Range Status   Specimen Description TISSUE RIGHT TOE  Final   Special Requests NONE  Final   Gram Stain   Final    FEW WBC PRESENT,BOTH PMN AND MONONUCLEAR NO ORGANISMS SEEN    Culture   Final    CULTURE REINCUBATED FOR BETTER GROWTH Performed at [facility], [address], [place],  [address]    Report Status PENDING  Incomplete      Studies: No results found.  Scheduled Meds: . amLODipine  5 mg Oral Daily  . amoxicillin-clavulanate  1 tablet Oral Q12H  . aspirin  325 mg Oral Daily  . atorvastatin  80 mg Oral q1800  . docusate sodium  100 mg Oral BID  .  enoxaparin (LOVENOX) injection  40 mg Subcutaneous Q24H  . escitalopram  10 mg Oral Daily  . gabapentin  300 mg Oral TID  . insulin aspart  0-20 Units Subcutaneous TID WC  . insulin aspart  0-5 Units Subcutaneous QHS  . [START ON 12/15/2018]  insulin glargine  28 Units Subcutaneous Daily  . isosorbide mononitrate  15 mg Oral Daily  . levothyroxine  25 mcg Oral Q0600  . metoprolol tartrate  25 mg Oral BID  . senna-docusate  2 tablet Oral BID    Continuous Infusions: . sodium chloride 10 mL/hr at 12/14/18 0547  . lactated ringers 10 mL/hr at 12/13/18 1357  . methocarbamol (ROBAXIN) IV       LOS: 3 days     Annita Brod, MD [facility]  To reach me or the doctor on call, go to: [email or web address] Password [email or web address]  12/14/2018, 1:36 PM

## 2018-12-14 NOTE — TOC Progression Note (Signed)
Transition of Care [facility]) - Progression Note    Patient Details  Name: [name] MRN: [id number] Date of Birth: 1966-05-23  [facility] CM/SW Contact  Jacalyn Lefevre Edson Snowball, RN Phone Number: 12/14/2018, 11:43 AM  Clinical Narrative:     Patient from home with son, who can provide assistance. Ordered walker called Brad with [facility].  Expected Discharge Plan: Home/Self Care Barriers to Discharge: Continued Medical Work up  Expected Discharge Plan and Services Expected Discharge Plan: Home/Self Care   Discharge Planning Services: CM Consult Post Acute Care Choice: Durable Medical Equipment Living arrangements for the past 2 months: Single Family Home Expected Discharge Date: 12/14/18               DME Arranged: Gilford Rile rolling DME Agency: [facility] HH Arranged: NA HH Agency: NA   Social Determinants of Health (SDOH) Interventions    Readmission Risk Interventions Readmission Risk Prevention Plan 12/14/2018 07/06/2018  Transportation Screening Complete Complete  PCP or Specialist Appt within 5-7 Days - Complete  PCP or Specialist Appt within 3-5 Days Complete -  Home Care Screening - Complete  Medication Review (RN CM) - Complete  HRI or Home Care Consult Complete -  Social Work Consult for Recovery Care Planning/Counseling Complete -  Palliative Care Screening Not Applicable -  Some recent data might be hidden

## 2018-12-14 NOTE — Progress Notes (Signed)
Initial Nutrition Assessment  RD working remotely.  DOCUMENTATION CODES:   Not applicable  INTERVENTION:   -MVI with minerals daily -1 packet Juven BID, each packet provides 90 calories, 2.5 grams of protein, 8 grams of carbohydrate, and 14 grams of amino acids; supplement contains CaHMB, glutamine, and arginine, to promote wound healing -Refer to [facility] Nutrition and Diabetes Education Service for uncontrolled DM  NUTRITION DIAGNOSIS:   Increased nutrient needs related to wound healing as evidenced by estimated needs.  GOAL:   Patient will meet greater than or equal to 90% of their needs  MONITOR:   PO intake, Supplement acceptance, Labs, Weight trends, Skin, I & O's  REASON FOR ASSESSMENT:   Consult Assessment of nutrition requirement/status  ASSESSMENT:   [name] is a 53 y.o. female with medical history significant for uncontrolled type 2 diabetes, peripheral vascular disease status post left great toe amputation, hypertension, hypothyroidism, chronic depression, peripheral neuropathy, who presented to [facility] ED with complaints of 4 days of right foot swelling and redness.  Patient has a history of peripheral vascular disease with impaired sensation in her feet.  Her son noted that her right foot was red and swollen and asked her to go to the ED be evaluated.  Associated symptoms include subjective fevers at home with intermittent nausea and vomiting.  No worsening or improving factors.  In the ED at [facility] x-ray right foot showed subcutaneous edema and stranding adjacent to the plantar surface of the base of the great toe with no clear evidence of osteomyelitis.  She was started on IV vancomycin and Zosyn and transferred to [facility] for further evaluation and treatment of present condition.  Vital signs noted with low grade temperature T-max 99.3, tachycardic with heart rate 108, uncontrolled hypertension.  Lab studies remarkable for leukocytosis with WBC 14 K.  Admitted  for sepsis secondary to right foot cellulitis, rule out osteomyelitis  Pt admitted with sepsis secondary to rt foot cellulitis, rule out osteomyelitis.   4/6- s/p PROCEDURE:  AMPUTATION RIGHT foot first ray.,  LOCAL REARRANGEMENT OF TISSUE FOR WOUND CLOSURE 9cm x 3cm,  VAC APPLICATION  Spoke with pt over phone. RD introduced self, role, and rationale for calling pt, however, pt politely declined speaking with this RD, due to another staff member being in the room.   Reviewed wt hx; wt has been stable over the past 6 months.   Pt with great appetite, noted meal completion 100%.   Pt with increased nutrient needs due to wound healing and wound vac and would benefit from addition of nutritional supplements.  Lab Results  Component Value Date   HGBA1C 10.5 (H) 12/12/2018   PTA DM medications are 50 units insulin solostar BID and 500 mg metformin BID. Reviewed DM coordinator notes. Pt has had a lot of social stressors; her husband passed away approximately 5 months ago, which caused her heart attack. Her PCP manages her DM(Dr. Charlett Blake).   Albumin has a half-life of 21 days and is strongly affected by stress response and inflammatory process, therefore, do not expect to see an improvement in this lab value during acute hospitalization. When a patient presents with low albumin, it is likely skewed due to the acute inflammatory response.  Unless it is suspected that patient had poor PO intake or malnutrition prior to admission, then RD should not be consulted solely for low albumin. Note that low albumin is no longer used to diagnose malnutrition; [place] uses the new malnutrition guidelines published by  the American Society for Parenteral and Enteral Nutrition (A.S.P.E.N.) and the Academy of Nutrition and Dietetics (AND).    Labs reviewed: CBGS: 119-208 (inpatient orders for glycemic control are 0-5 units insulin aspart q HS, 0-9 units insulin aspart TID, and 25 units insulin glarine daily).    NUTRITION - FOCUSED PHYSICAL EXAM:    Most Recent Value  Orbital Region  Unable to assess  Upper Arm Region  Unable to assess  Thoracic and Lumbar Region  Unable to assess  Buccal Region  Unable to assess  Temple Region  Unable to assess  Clavicle Bone Region  Unable to assess  Clavicle and Acromion Bone Region  Unable to assess  Scapular Bone Region  Unable to assess  Dorsal Hand  Unable to assess  Patellar Region  Unable to assess  Anterior Thigh Region  Unable to assess  Posterior Calf Region  Unable to assess  Edema (RD Assessment)  Unable to assess  Hair  Unable to assess  Eyes  Unable to assess  Mouth  Unable to assess  Skin  Unable to assess  Nails  Unable to assess       Diet Order:   Diet Order            Diet Carb Modified Fluid consistency: Thin; Room service appropriate? Yes  Diet effective now              EDUCATION NEEDS:   No education needs have been identified at this time  Skin:  Skin Assessment: Skin Integrity Issues: Skin Integrity Issues:: Incisions, Wound VAC Wound Vac: rt foot Incisions: rt foot  Last BM:  12/14/18  Height:   Ht Readings from Last 1 Encounters:  12/11/18 5\' 2"  (1.575 m)    Weight:   Wt Readings from Last 1 Encounters:  12/14/18 72.5 kg    Ideal Body Weight:  50 kg  BMI:  Body mass index is 29.23 kg/m.  Estimated Nutritional Needs:   Kcal:  1800-2000  Protein:  95-110 grams  Fluid:  > 1.8 L    Jenifer A. Jimmye Norman, RD, LDN, [place] Registered Dietitian II Certified Diabetes Care and Education Specialist Pager: [phone] After hours Pager: [phone]

## 2018-12-14 NOTE — Progress Notes (Signed)
Pharmacy Antibiotic Note  [name] is a 53 y.o. female admitted on 12/11/2018 with foot cellulitis s/p amputation today. Pharmacy has been consulted for vanc dosing and on Zosyn.  Foot wound culture positive for E. Faecalis (Ampicillin sensitive) and Staphylococcus simulans (Oxacillin sensitive). Will narrow antibiotics.  Current SCR up to 1.41 with estimated CrCl ~ 42 mL/min.   Plan: Discussed with Dr. Maryland Pink:  Discontinue Vancomycin and Zosyn.  Start Augmentin 875mg  po BID.  Pharmacy will sign off prior consults.  Will monitor renal function and adjust dosing if needed per hospital pharmacy protocol.    Height: 5\' 2"  (157.5 cm) Weight: 159 lb 13.3 oz (72.5 kg) IBW/kg (Calculated) : 50.1  Temp (24hrs), Avg:98.3 F (36.8 C), Min:97.5 F (36.4 C), Max:99.4 F (37.4 C)  Recent Labs  Lab 12/11/18 1003 12/11/18 1004 12/12/18 0523 12/14/18 0445  WBC 14.8*  --  15.3* 14.6*  CREATININE 1.27*  --  1.38* 1.41*  LATICACIDVEN  --  0.9  --   --     Estimated Creatinine Clearance: 42.1 mL/min (A) (by C-G formula based on SCr of 1.41 mg/dL (H)).    Allergies  Allergen Reactions  . Lyrica [Pregabalin] Other (See Comments)    MADE PATIENT VERY EMOTIONAL AND WOULD CRY EASILY  . Morphine And Related Nausea And Vomiting  . Tramadol Nausea And Vomiting    Pt cant tolerate this pain med.     Antimicrobials this admission: 4/4 vanc>>4/7 4/4 Zosyn >>4/7 4/7 Augmentin >>  Dose adjustments this admission: 4/5 decrease vancomycin dose  Microbiology results: 4/4 blood>> 4/4 wound>> moderate E. Faecalis (Ampsens) + Staphylococcus simulans (R-tetracycline and erythromycin; oxacillin sens) UCx:   Sloan Leiter, PharmD, BCPS, BCCCP Clinical Pharmacist Please refer to [facility] [place] for [place] numbers 12/14/2018 1:14 PM

## 2018-12-14 NOTE — Plan of Care (Signed)
  Problem: Education: Goal: Knowledge of General Education information will improve Description Including pain rating scale, medication(s)/side effects and non-pharmacologic comfort measures Outcome: Progressing   Problem: Health Behavior/Discharge Planning: Goal: Ability to manage health-related needs will improve Outcome: Progressing   Problem: Clinical Measurements: Goal: Ability to maintain clinical measurements within normal limits will improve Outcome: Progressing Goal: Will remain free from infection Outcome: Progressing Goal: Diagnostic test results will improve Outcome: Progressing   Problem: Activity: Goal: Risk for activity intolerance will decrease Outcome: Progressing   Problem: Nutrition: Goal: Adequate nutrition will be maintained Outcome: Progressing   Problem: Safety: Goal: Ability to remain free from injury will improve Outcome: Progressing   Problem: Skin Integrity: Goal: Risk for impaired skin integrity will decrease Outcome: Progressing   Problem: Clinical Measurements: Goal: Ability to avoid or minimize complications of infection will improve Outcome: Progressing   Problem: Skin Integrity: Goal: Skin integrity will improve Outcome: Progressing

## 2018-12-14 NOTE — Progress Notes (Signed)
Verbal order to give 30 units total for 1800 coverage per Dr. Maryland Pink.

## 2018-12-14 NOTE — Progress Notes (Addendum)
Physical Therapy Re-Evaluation and Treatment Note Patient Details Name: [name] MRN: [id number] DOB: [birth year] Today's Date: 12/14/2018    History of Present Illness Pt admitted with R foot cellulitis (small puncture wound from childs toy).  Pt with hx of PVD, peripheral neuropathy, DM, CHF, CABG, and CVA, s/p R 1st ray amputation 12/13/2018    PT Comments    Pt found sitting in chair on entry, eager to work with therapy. Pt educated on need to maintain NWB on R LE. Pt states no one had told her that. Pt attempted sit>stand and was unable to perform without WB through R LE on first 2 attempts, 3 subsequent transfers pt was NWB. Pt requires maximal vc for hop-to pattern, and only able to maintain for 5 feet before becoming WBAT. Pt returned to recliner and worked on UE strength to improve mobility. NWB status reiterated with patient, and recommend w/c usage. Pt reports she has w/c at home, but will require RW for transfers and 3-in-1 because w/c will not fit in bathroom. In light of pt current precautions, pt will requires HHPT at d/c to improve safe mobility in her home at d/c. PT will continue to follow acutely.    Follow Up Recommendations  Home health PT     Equipment Recommendations  Rolling walker with 5" wheels;3in1 (PT)       Precautions / Restrictions Precautions Precautions: Fall Restrictions Weight Bearing Restrictions: No    Mobility  Bed Mobility               General bed mobility comments: OOB in chair on entry  Transfers Overall transfer level: Needs assistance Equipment used: Rolling walker (2 wheeled) Transfers: Sit to/from Stand Sit to Stand: Min guard         General transfer comment: min guard for safety, maximal vc for extending R LE out in front of her to maintain NWB, pt unable to complete maintaining weightbearing on first 2 attempts, pt able to perform 3 times with R LE NWB   Ambulation/Gait Ambulation/Gait assistance: Min assist Gait  Distance (Feet): 10 Feet Assistive device: Rolling walker (2 wheeled) Gait Pattern/deviations: Decreased step length - left;Step-to pattern;Antalgic(hop to pattern) Gait velocity: decr Gait velocity interpretation: <1.31 ft/sec, indicative of household ambulator General Gait Details: min A for steadying as pt attempts hop-to pattern for NWB, pt able to maintain about 5 feet before unable to maintain NWB and ambulates back to chair with WBAT        Balance Overall balance assessment: Needs assistance Sitting-balance support: Feet supported;No upper extremity supported Sitting balance-Leahy Scale: Good     Standing balance support: Bilateral upper extremity supported Standing balance-Leahy Scale: Poor Standing balance comment: to maintain static stand maintaining NWB                            Cognition Arousal/Alertness: Awake/alert Behavior During Therapy: WFL for tasks assessed/performed Overall Cognitive Status: Within Functional Limits for tasks assessed                                        Exercises General Exercises - Upper Extremity Chair Push Up: AROM;5 reps;Seated    General Comments General comments (skin integrity, edema, etc.): Pt reports that she had no knowledge that she was not supposed to be using her R LE for walking at all. Pt states  walking into bathroom all day WBAT. Pt educated on need for NWB to insure proper healing.       Pertinent Vitals/Pain Pain Assessment: Faces Faces Pain Scale: Hurts little more Pain Location: R foot with mobility Pain Descriptors / Indicators: Guarding;Sore Pain Intervention(s): Limited activity within patient's tolerance;Monitored during session;Repositioned           PT Goals (current goals can now be found in the care plan section) Acute Rehab PT Goals Patient Stated Goal: Regain IND PT Goal Formulation: With patient Time For Goal Achievement: 12/19/18 Potential to Achieve Goals:  Good Progress towards PT goals: Progressing toward goals    Frequency    Min 3X/week      PT Plan Current plan remains appropriate       AM-PAC PT "6 Clicks" Mobility   Outcome Measure  Help needed turning from your back to your side while in a flat bed without using bedrails?: None Help needed moving from lying on your back to sitting on the side of a flat bed without using bedrails?: None Help needed moving to and from a bed to a chair (including a wheelchair)?: A Little Help needed standing up from a chair using your arms (e.g., wheelchair or bedside chair)?: A Little Help needed to walk in hospital room?: A Little Help needed climbing 3-5 steps with a railing? : A Little 6 Click Score: 20    End of Session Equipment Utilized During Treatment: Gait belt Activity Tolerance: Patient tolerated treatment well Patient left: in bed;with call bell/phone within reach;with nursing/sitter in room Nurse Communication: Mobility status PT Visit Diagnosis: Difficulty in walking, not elsewhere classified (R26.2);Unsteadiness on feet (R26.81)     Time: 5456-2563 PT Time Calculation (min) (ACUTE ONLY): 23 min  Charges:  $Gait Training: 8-22 mins $Therapeutic Exercise: 8-22 mins                     Elizabeth B. Migdalia Dk PT, DPT Acute Rehabilitation Services Pager [phone] Office [phone]    [place] 12/14/2018, 4:45 PM

## 2018-12-15 ENCOUNTER — Encounter (HOSPITAL_COMMUNITY): Payer: Self-pay | Admitting: Orthopedic Surgery

## 2018-12-15 ENCOUNTER — Inpatient Hospital Stay (HOSPITAL_COMMUNITY): Payer: BLUE CROSS/BLUE SHIELD

## 2018-12-15 LAB — GLUCOSE, CAPILLARY
Glucose-Capillary: 195 mg/dL — ABNORMAL HIGH (ref 70–99)
Glucose-Capillary: 235 mg/dL — ABNORMAL HIGH (ref 70–99)
Glucose-Capillary: 99 mg/dL (ref 70–99)
Glucose-Capillary: 104 mg/dL — ABNORMAL HIGH (ref 70–99)

## 2018-12-15 LAB — CBC
HCT: 27.8 % — ABNORMAL LOW (ref 36.0–46.0)
Hemoglobin: 8.8 g/dL — ABNORMAL LOW (ref 12.0–15.0)
MCH: 26.9 pg (ref 26.0–34.0)
MCHC: 31.7 g/dL (ref 30.0–36.0)
MCV: 85 fL (ref 80.0–100.0)
Platelets: 574 10*3/uL — ABNORMAL HIGH (ref 150–400)
RBC: 3.27 MIL/uL — ABNORMAL LOW (ref 3.87–5.11)
RDW: 13.5 % (ref 11.5–15.5)
WBC: 13.8 10*3/uL — ABNORMAL HIGH (ref 4.0–10.5)
nRBC: 0 % (ref 0.0–0.2)

## 2018-12-15 LAB — BASIC METABOLIC PANEL
Anion gap: 11 (ref 5–15)
BUN: 37 mg/dL — ABNORMAL HIGH (ref 6–20)
CO2: 23 mmol/L (ref 22–32)
Calcium: 8.4 mg/dL — ABNORMAL LOW (ref 8.9–10.3)
Chloride: 101 mmol/L (ref 98–111)
Creatinine, Ser: 2.19 mg/dL — ABNORMAL HIGH (ref 0.44–1.00)
GFR calc Af Amer: 28 mL/min — ABNORMAL LOW (ref >60)
GFR calc non Af Amer: 25 mL/min — ABNORMAL LOW (ref >60)
Glucose, Bld: 269 mg/dL — ABNORMAL HIGH (ref 70–99)
Potassium: 3.8 mmol/L (ref 3.5–5.1)
Sodium: 135 mmol/L (ref 135–145)

## 2018-12-15 MED ORDER — INSULIN GLARGINE 100 UNIT/ML ~~LOC~~ SOLN
35.0000 [IU] | Freq: Two times a day (BID) | SUBCUTANEOUS | Status: DC
  Administered 2018-12-15 – 2018-12-16 (×2): 35 [IU] via SUBCUTANEOUS
  Filled 2018-12-15 (×4): qty 0.35

## 2018-12-15 MED ORDER — INSULIN GLARGINE 100 UNIT/ML ~~LOC~~ SOLN
7.0000 [IU] | SUBCUTANEOUS | Status: AC
  Administered 2018-12-15: 7 [IU] via SUBCUTANEOUS
  Filled 2018-12-15: qty 0.07

## 2018-12-15 MED ORDER — LACTATED RINGERS IV BOLUS
500.0000 mL | Freq: Once | INTRAVENOUS | Status: AC
  Administered 2018-12-15: 500 mL via INTRAVENOUS

## 2018-12-15 NOTE — Anesthesia Postprocedure Evaluation (Signed)
Anesthesia Post Note  Patient: [name]  Procedure(s) Performed: AMPUTATION RIGHT GREAT TOE, LOCAL RELOCATION OF TISSUE FOR WOUND CLOSURE 9cm x 3cm, VAC APPLICATION (Right Foot)     Patient location during evaluation: PACU Anesthesia Type: Regional and MAC Level of consciousness: awake and alert Pain management: pain level controlled Vital Signs Assessment: post-procedure vital signs reviewed and stable Respiratory status: spontaneous breathing, nonlabored ventilation, respiratory function stable and patient connected to nasal cannula oxygen Cardiovascular status: stable and blood pressure returned to baseline Postop Assessment: no apparent nausea or vomiting Anesthetic complications: no    Last Vitals:  Vitals:   12/14/18 2250 12/15/18 0350  BP: 138/74 135/70  Pulse: 73 66  Resp:  16  Temp:  36.5 C  SpO2:  98%    Last Pain:  Vitals:   12/15/18 0350  TempSrc: Oral  PainSc:                  [place]

## 2018-12-15 NOTE — Progress Notes (Signed)
Physical Therapy Treatment Patient Details Name: [name] MRN: [id number] DOB: 07/15/1966 Today's Date: 12/15/2018    History of Present Illness Pt admitted with R foot cellulitis (small puncture wound from childs toy).  Pt with hx of PVD, peripheral neuropathy, DM, CHF, CABG, and CVA, s/p R 1st ray amputation 12/13/2018    PT Comments    Continued to provide pt with education on need to maintain weight bearing precautions to maximize R foot healing. Also educated that her healing rate is already impeded by hx of PVD and her decreased protein intake. Pt verbalizes understanding. Pt unable to maintain NWB hopping from bathroom to recliner, despite maximal verbal and tactile cuing for optimal placement of UE by her hips to maximize ability to clear L LE. Once in recliner focused on sit>stands, chair push ups and hopping in place. Pt advised to pivot to [facility] until she gains enough strength and coordination to perform hopping for longer distances. D/c plan remains appropriate with support at home and use of wheelchair for mobility. PT will continue to follow acutely.    Follow Up Recommendations  Home health PT     Equipment Recommendations  Rolling walker with 5" wheels;3in1 (PT)       Precautions / Restrictions Precautions Precautions: Fall Required Braces or Orthoses: Other Brace(surgical shoe) Restrictions Weight Bearing Restrictions: Yes RLE Weight Bearing: Non weight bearing    Mobility  Bed Mobility               General bed mobility comments: in bathroom on entry  Transfers Overall transfer level: Needs assistance Equipment used: Rolling walker (2 wheeled) Transfers: Sit to/from Stand Sit to Stand: Min guard         General transfer comment: min guard for sit to stand from toilet utilizing handrails, also for 5x sit>stand from recliner, continues to require vc for keeping R LE extended in front of her   Ambulation/Gait Ambulation/Gait assistance: Min  assist Gait Distance (Feet): 10 Feet Assistive device: Rolling walker (2 wheeled) Gait Pattern/deviations: Decreased step length - left;Step-to pattern;Antalgic(hop to pattern) Gait velocity: decr Gait velocity interpretation: <1.31 ft/sec, indicative of household ambulator General Gait Details: min A for steadying, pt requires maximal vc for keeping R foot off of floor, able to take 3 hops at a time before putting her foot down        Balance Overall balance assessment: Needs assistance Sitting-balance support: Feet supported;No upper extremity supported Sitting balance-Leahy Scale: Good     Standing balance support: Bilateral upper extremity supported Standing balance-Leahy Scale: Poor Standing balance comment: to maintain static stand maintaining NWB                            Cognition Arousal/Alertness: Awake/alert Behavior During Therapy: WFL for tasks assessed/performed Overall Cognitive Status: Within Functional Limits for tasks assessed                                        Exercises General Exercises - Upper Extremity Chair Push Up: AROM;5 reps;Seated Other Exercises Other Exercises: 2 bouts of 5x hopping in place, vc for keeping arms by hips for most efficient use of UE to clear L LE, pt able to perform with NWB     General Comments General comments (skin integrity, edema, etc.): NT reports that pt was able to hop into bathroom, given  pt performance hopping back to recliner, doubt that this was the case. Informed NT and pt to utilize [facility] to maintain NWB.      Pertinent Vitals/Pain Pain Assessment: Faces Faces Pain Scale: Hurts little more Pain Location: R foot with mobility Pain Descriptors / Indicators: Guarding;Sore Pain Intervention(s): Limited activity within patient's tolerance;Monitored during session;Repositioned;RN gave pain meds during session           PT Goals (current goals can now be found in the care plan section)  Acute Rehab PT Goals Patient Stated Goal: Regain IND PT Goal Formulation: With patient Time For Goal Achievement: 12/19/18 Potential to Achieve Goals: Good    Frequency    Min 3X/week      PT Plan Current plan remains appropriate       AM-PAC PT "6 Clicks" Mobility   Outcome Measure  Help needed turning from your back to your side while in a flat bed without using bedrails?: None Help needed moving from lying on your back to sitting on the side of a flat bed without using bedrails?: None Help needed moving to and from a bed to a chair (including a wheelchair)?: A Little Help needed standing up from a chair using your arms (e.g., wheelchair or bedside chair)?: A Little Help needed to walk in hospital room?: A Little Help needed climbing 3-5 steps with a railing? : A Little 6 Click Score: 20    End of Session Equipment Utilized During Treatment: Gait belt Activity Tolerance: Patient tolerated treatment well Patient left: in bed;with call bell/phone within reach;with nursing/sitter in room Nurse Communication: Mobility status PT Visit Diagnosis: Difficulty in walking, not elsewhere classified (R26.2);Unsteadiness on feet (R26.81)     Time: 3419-6222 PT Time Calculation (min) (ACUTE ONLY): 22 min  Charges:  $Therapeutic Exercise: 8-22 mins                     Elizabeth B. Migdalia Dk PT, DPT Acute Rehabilitation Services Pager [phone] Office [phone]    [place] 12/15/2018, 10:37 AM

## 2018-12-15 NOTE — Progress Notes (Signed)
[name], female   DOB: 10-27-1965, 53 y.o.   MRN: [id number] Patient is resting comfortably the dressing is clean and dry there is no drainage in the wound VAC canister she has been transitioned to Augmentin.  Plan for discharge when safe with therapy.  Continue wound VAC at discharge.

## 2018-12-15 NOTE — Plan of Care (Signed)
  Problem: Education: Goal: Knowledge of General Education information will improve Description Including pain rating scale, medication(s)/side effects and non-pharmacologic comfort measures Outcome: Progressing   Problem: Health Behavior/Discharge Planning: Goal: Ability to manage health-related needs will improve Outcome: Progressing   Problem: Clinical Measurements: Goal: Ability to maintain clinical measurements within normal limits will improve Outcome: Progressing Goal: Will remain free from infection Outcome: Progressing Goal: Diagnostic test results will improve Outcome: Progressing   Problem: Activity: Goal: Risk for activity intolerance will decrease Outcome: Progressing   Problem: Nutrition: Goal: Adequate nutrition will be maintained Outcome: Progressing   Problem: Safety: Goal: Ability to remain free from injury will improve Outcome: Progressing   Problem: Skin Integrity: Goal: Risk for impaired skin integrity will decrease Outcome: Progressing   Problem: Clinical Measurements: Goal: Ability to avoid or minimize complications of infection will improve Outcome: Progressing   Problem: Skin Integrity: Goal: Skin integrity will improve Outcome: Progressing

## 2018-12-15 NOTE — Progress Notes (Signed)
PROGRESS NOTE    [name]  IFO:[id number] DOB: 07-21-1966 DOA: 12/11/2018 PCP: Tiffany Lukes, MD   Brief Narrative:  Tiffany Coburn Davenportis a 53 y.o.femalewith medical history significant foruncontrolled type 2 diabetes,peripheral vascular disease status post left great toe amputation, hypertension, hypothyroidism, chronic depression, peripheral neuropathy, who was admitted to the hospital service on 4/4 after presented to the emergency room with 4 days of right foot swelling and redness and found to have Sepsis from cellulitis of right foot secondary to right great toe abscess with suspected osteomyelitis.Seen by orthopedic surgery and given risk factors including severe protein calorie malnutrition, felt best for amputation of right great toe.  Patient underwent this on 4/6  Today, patient is doing well with no complaints.  Was able to ambulate around the unit earlier.  Wound culture positive for penicillin sensitive staph and penicillin sensitive enterococcus.  Antibiotics changed to Augmentin.  Assessment & Plan:   Principal Problem:   Abscess of great toe, right Active Problems:   Essential hypertension, benign   PVD (peripheral vascular disease) (HCC)   Diabetic polyneuropathy associated with type 2 diabetes mellitus (HCC)   Severe protein-calorie malnutrition (HCC)   Diabetic retinopathy ([place])   Type 2 diabetes mellitus with vascular disease ([place])   Coronary artery disease involving native coronary artery of native heart with angina pectoris (HCC)   S/P CABG x 3   Cellulitis of foot, right   Diabetic infection of right foot ([place])  Sepsis from underlying abscess of great toe, right with secondary right foot cellulitis and underlying history of peripheral vascular disease:    met criteria for sepsis given leukocytosis, infected toe source, tachycardia and fever.   Sepsis now resolved, status post amputation.   Antibiotics changed to p.o. Augmentin Monitor white blood  cell count and if improved, can likely discharge home tomorrow Wound cx with enterococcus faecalis (sensitive to ampicillin) and staph simulans (oxacillin sensitive).  Abx changed to augmentin. Bcx NGTD  Acute Kidney Injury: baseline creatinine looks to be <1.0 in 2019.  At presentation, creatinine was 1.27, has been steadily rising with big jump today to 2.19.  Consider ATN related to sepsis, hypovolemia, related to meds (was on vanc/zosyn).  Continue IVF Follow UA  Follow renal US  Diabetic polyneuropathy associated with type 2 diabetes mellitus ([place]) uncontrolled also with diabetic retinopathy:   A1c notes very poor control at 10.5.   She takes 50 units BID at home Will increase to 35 units BID here and monitor closely  Resistant SSI  Severe protein-calorie malnutrition [facility]): appreciate RD assistance.  Coronary artery disease involving native coronary artery of native heart:  No CP at this time.  Hypothyroidism: synthroid  DVT prophylaxis: lovenox Code Status: full  Family Communication: none at bedside Disposition Plan: pending improvement in AKI  Consultants:   Orthopedics  Procedures:   4/6 amputation of R foot first ray, local rearrangement of tissue for wound closure, vac application  Antimicrobials:  Anti-infectives (From admission, onward)   Start     Dose/Rate Route Frequency Ordered Stop   12/14/18 2000  amoxicillin-clavulanate (AUGMENTIN) 875-125 MG per tablet 1 tablet     1 tablet Oral Every 12 hours 12/14/18 1320     12/13/18 1345  ceFAZolin (ANCEF) IVPB 2g/100 mL premix     2 g 200 mL/hr over 30 Minutes Intravenous To [facility] 12/13/18 1152 12/13/18 1501   12/13/18 0000  vancomycin (VANCOCIN) IVPB 750 mg/150 ml premix  Status:  Discontinued  750 mg 150 mL/hr over 60 Minutes Intravenous Every 24 hours 12/12/18 1021 12/14/18 1320   12/11/18 2300  vancomycin (VANCOCIN) IVPB 750 mg/150 ml premix  Status:  Discontinued     750 mg 150 mL/hr over  60 Minutes Intravenous Every 12 hours 12/11/18 1039 12/12/18 1021   12/11/18 2000  piperacillin-tazobactam (ZOSYN) IVPB 3.375 g  Status:  Discontinued     3.375 g 12.5 mL/hr over 240 Minutes Intravenous Every 8 hours 12/11/18 1724 12/14/18 1320   12/11/18 1730  piperacillin-tazobactam (ZOSYN) IVPB 3.375 g  Status:  Discontinued     3.375 g 100 mL/hr over 30 Minutes Intravenous Every 8 hours 12/11/18 1720 12/11/18 1724   12/11/18 1142  vancomycin (VANCOCIN) 1-5 GM/200ML-% IVPB    Note to Pharmacy:  [name]   : cabinet override      12/11/18 1142 12/11/18 1907   12/11/18 1015  vancomycin (VANCOCIN) 1,000 mg in sodium chloride 0.9 % 250 mL IVPB     1,000 mg 250 mL/hr over 60 Minutes Intravenous  Once 12/11/18 1011 12/11/18 1251   12/11/18 1015  piperacillin-tazobactam (ZOSYN) IVPB 3.375 g     3.375 g 100 mL/hr over 30 Minutes Intravenous  Once 12/11/18 1011 12/11/18 1138     Subjective: Feels well today.  States she takes 50 lantus twice daily.   Objective: Vitals:   12/14/18 1345 12/14/18 2054 12/14/18 2250 12/15/18 0350  BP: (!) 101/59 (!) 99/55 138/74 135/70  Pulse: 71 76 73 66  Resp: 18 16  16   Temp: 98.4 F (36.9 C) 98.6 F (37 C)  97.7 F (36.5 C)  TempSrc: Oral Oral  Oral  SpO2: 98% 98%  98%  Weight:      Height:        Intake/Output Summary (Last 24 hours) at 12/15/2018 1013 Last data filed at 12/15/2018 0900 Gross per 24 hour  Intake 720 ml  Output -  Net 720 ml   Filed Weights   12/11/18 0940 12/11/18 1632 12/14/18 0500  Weight: 72.6 kg 70.3 kg 72.5 kg    Examination:  General exam: Appears calm and comfortable  Respiratory system: Clear to auscultation. Respiratory effort normal. Cardiovascular system: S1 & S2 heard, RRR. Gastrointestinal system: Abdomen is nondistended, soft and nontender.  Central nervous system: Alert and oriented. No focal neurological deficits. Extremities: RLE with wound vac in place, s/p amputation of first ray Psychiatry:  Judgement and insight appear normal. Mood & affect appropriate.     Data Reviewed: I have personally reviewed following labs and imaging studies  CBC: Recent Labs  Lab 12/11/18 1003 12/12/18 0523 12/14/18 0445 12/15/18 0529  WBC 14.8* 15.3* 14.6* 13.8*  NEUTROABS 12.3* 12.2*  --   --   HGB 10.1* 10.6* 9.4* 8.8*  HCT 32.5* 33.4* 29.9* 27.8*  MCV 85.8 86.8 83.8 85.0  PLT 458* 497* 531* 027*   Basic Metabolic Panel: Recent Labs  Lab 12/11/18 1003 12/12/18 0523 12/14/18 0445 12/15/18 0529  NA 133* 135 137 135  K 4.0 3.7 3.7 3.8  CL 98 100 103 101  CO2 25 25 23 23   GLUCOSE 308* 180* 215* 269*  BUN 23* 24* 22* 37*  CREATININE 1.27* 1.38* 1.41* 2.19*  CALCIUM 8.3* 8.5* 8.3* 8.4*   GFR: Estimated Creatinine Clearance: 27.1 mL/min (A) (by C-G formula based on SCr of 2.19 mg/dL (H)). Liver Function Tests: Recent Labs  Lab 12/11/18 1003  AST 14*  ALT 17  ALKPHOS 92  BILITOT 0.7  PROT 6.9  ALBUMIN 2.4*   No results for input(s): LIPASE, AMYLASE in the last 168 hours. No results for input(s): AMMONIA in the last 168 hours. Coagulation Profile: No results for input(s): INR, PROTIME in the last 168 hours. Cardiac Enzymes: No results for input(s): CKTOTAL, CKMB, CKMBINDEX, TROPONINI in the last 168 hours. BNP (last 3 results) No results for input(s): PROBNP in the last 8760 hours. HbA1C: No results for input(s): HGBA1C in the last 72 hours. CBG: Recent Labs  Lab 12/14/18 0742 12/14/18 1146 12/14/18 1624 12/14/18 2148 12/15/18 0821  GLUCAP 208* 279* 405* 157* 195*   Lipid Profile: No results for input(s): CHOL, HDL, LDLCALC, TRIG, CHOLHDL, LDLDIRECT in the last 72 hours. Thyroid Function Tests: No results for input(s): TSH, T4TOTAL, FREET4, T3FREE, THYROIDAB in the last 72 hours. Anemia Panel: No results for input(s): VITAMINB12, FOLATE, FERRITIN, TIBC, IRON, RETICCTPCT in the last 72 hours. Sepsis Labs: Recent Labs  Lab 12/11/18 1004  LATICACIDVEN 0.9     Recent Results (from the past 240 hour(s))  Culture, blood (routine x 2)     Status: None (Preliminary result)   Collection Time: 12/11/18 10:27 AM  Result Value Ref Range Status   Specimen Description   Final    BLOOD BLOOD RIGHT ARM Performed at [facility], [address] [place], [place] [address]    Special Requests   Final    BOTTLES DRAWN AEROBIC AND ANAEROBIC Blood Culture adequate volume Performed at [facility], [address], [place], [place] [address]    Culture   Final    NO GROWTH 4 DAYS Performed at [facility], [place] [address] [place], [place] [address]    Report Status PENDING  Incomplete  Culture, blood (routine x 2)     Status: None (Preliminary result)   Collection Time: 12/11/18 10:44 AM  Result Value Ref Range Status   Specimen Description   Final    BLOOD BLOOD LEFT HAND Performed at [facility], [address] [place], [place] [address]    Special Requests   Final    BOTTLES DRAWN AEROBIC ONLY Blood Culture results may not be optimal due to an inadequate volume of blood received in culture bottles Performed at [facility], [address] [place], [place] [address]    Culture   Final    NO GROWTH 4 DAYS Performed at [facility], [place] [address], [place], [place] [address]    Report Status PENDING  Incomplete  Wound or Superficial Culture     Status: None   Collection Time: 12/11/18 11:02 AM  Result Value Ref Range Status   Specimen Description   Final    WOUND FOOT RIGHT Performed at [facility], [place] [address] [place], [place] [address]    Special Requests   Final    Normal Performed at [facility], [address] [place], [place] [address]    Gram Stain   Final    NO WBC SEEN FEW GRAM POSITIVE COCCI IN PAIRS Performed at [facility], [place] [address], [place], [place] [address]    Culture   Final    MODERATE ENTEROCOCCUS FAECALIS MODERATE  STAPHYLOCOCCUS SIMULANS    Report Status 12/14/2018 FINAL  Final   Organism ID, Bacteria ENTEROCOCCUS FAECALIS  Final   Organism ID, Bacteria STAPHYLOCOCCUS SIMULANS  Final      Susceptibility   Enterococcus faecalis -  MIC*    AMPICILLIN <=2 SENSITIVE Sensitive     VANCOMYCIN 1 SENSITIVE Sensitive     GENTAMICIN SYNERGY SENSITIVE Sensitive     * MODERATE ENTEROCOCCUS FAECALIS   Staphylococcus simulans - MIC*    CIPROFLOXACIN <=0.5 SENSITIVE Sensitive     ERYTHROMYCIN >=8 RESISTANT Resistant     GENTAMICIN <=0.5 SENSITIVE Sensitive     OXACILLIN <=0.25 SENSITIVE Sensitive     TETRACYCLINE >=16 RESISTANT Resistant     VANCOMYCIN 1 SENSITIVE Sensitive     TRIMETH/SULFA <=10 SENSITIVE Sensitive     CLINDAMYCIN <=0.25 SENSITIVE Sensitive     RIFAMPIN <=0.5 SENSITIVE Sensitive     Inducible Clindamycin NEGATIVE Sensitive     * MODERATE STAPHYLOCOCCUS SIMULANS  MRSA PCR Screening     Status: None   Collection Time: 12/13/18  5:23 AM  Result Value Ref Range Status   MRSA by PCR NEGATIVE NEGATIVE Final    Comment:        The GeneXpert MRSA Assay (FDA approved for NASAL specimens only), is one component of a comprehensive MRSA colonization surveillance program. It is not intended to diagnose MRSA infection nor to guide or monitor treatment for MRSA infections. Performed at [facility], [place] [address], [place], [place] [address]   Aerobic/Anaerobic Culture (surgical/deep wound)     Status: None (Preliminary result)   Collection Time: 12/13/18  2:50 PM  Result Value Ref Range Status   Specimen Description TISSUE RIGHT TOE  Final   Special Requests NONE  Final   Gram Stain   Final    FEW WBC PRESENT,BOTH PMN AND MONONUCLEAR NO ORGANISMS SEEN    Culture   Final    FEW STAPHYLOCOCCUS SIMULANS CRITICAL RESULT CALLED TO, READ BACK BY AND VERIFIED WITH: RN Ricky Ala [id number] AT 71 AM BY CM Performed at [facility], [place] [address] [place], [place] [address]    Report  Status PENDING  Incomplete   Organism ID, Bacteria STAPHYLOCOCCUS SIMULANS  Final      Susceptibility   Staphylococcus simulans - MIC*    CIPROFLOXACIN <=0.5 SENSITIVE Sensitive     ERYTHROMYCIN <=0.25 SENSITIVE Sensitive     GENTAMICIN <=0.5 SENSITIVE Sensitive     OXACILLIN <=0.25 SENSITIVE Sensitive     TETRACYCLINE <=1 SENSITIVE Sensitive     VANCOMYCIN <=0.5 SENSITIVE Sensitive     TRIMETH/SULFA <=10 SENSITIVE Sensitive     CLINDAMYCIN <=0.25 SENSITIVE Sensitive     RIFAMPIN <=0.5 SENSITIVE Sensitive     Inducible Clindamycin NEGATIVE Sensitive     * FEW STAPHYLOCOCCUS SIMULANS         Radiology Studies: No results found.      Scheduled Meds: . amLODipine  5 mg Oral Daily  . amoxicillin-clavulanate  1 tablet Oral Q12H  . aspirin  325 mg Oral Daily  . atorvastatin  80 mg Oral q1800  . docusate sodium  100 mg Oral BID  . enoxaparin (LOVENOX) injection  40 mg Subcutaneous Q24H  . escitalopram  10 mg Oral Daily  . gabapentin  300 mg Oral TID  . insulin aspart  0-20 Units Subcutaneous TID WC  . insulin aspart  0-5 Units Subcutaneous QHS  . insulin glargine  28 Units Subcutaneous Daily  . isosorbide mononitrate  15 mg Oral Daily  . levothyroxine  25 mcg Oral Q0600  . metoprolol tartrate  25 mg Oral BID  . multivitamin with minerals  1 tablet Oral Daily  . nutrition supplement (JUVEN)  1  packet Oral BID BM  . senna-docusate  2 tablet Oral BID   Continuous Infusions: . sodium chloride 10 mL/hr at 12/14/18 0547  . lactated ringers 10 mL/hr at 12/13/18 1357  . methocarbamol (ROBAXIN) IV       LOS: 4 days    Time spent: over 30 min    Fayrene Helper, MD [facility] Pager AMION  If 7PM-7AM, please contact night-coverage [email or web address] Password [facility] 12/15/2018, 10:13 AM

## 2018-12-16 LAB — URINALYSIS, ROUTINE W REFLEX MICROSCOPIC
Bilirubin Urine: NEGATIVE
Glucose, UA: 50 mg/dL — AB
Ketones, ur: NEGATIVE mg/dL
Leukocytes,Ua: NEGATIVE
Nitrite: NEGATIVE
Protein, ur: 30 mg/dL — AB
Specific Gravity, Urine: 1.008 (ref 1.005–1.030)
pH: 5 (ref 5.0–8.0)

## 2018-12-16 LAB — CBC
HCT: 29.7 % — ABNORMAL LOW (ref 36.0–46.0)
Hemoglobin: 9.3 g/dL — ABNORMAL LOW (ref 12.0–15.0)
MCH: 26.7 pg (ref 26.0–34.0)
MCHC: 31.3 g/dL (ref 30.0–36.0)
MCV: 85.3 fL (ref 80.0–100.0)
Platelets: 662 10*3/uL — ABNORMAL HIGH (ref 150–400)
RBC: 3.48 MIL/uL — ABNORMAL LOW (ref 3.87–5.11)
RDW: 13.6 % (ref 11.5–15.5)
WBC: 13 10*3/uL — ABNORMAL HIGH (ref 4.0–10.5)
nRBC: 0 % (ref 0.0–0.2)

## 2018-12-16 LAB — BASIC METABOLIC PANEL
Anion gap: 11 (ref 5–15)
Anion gap: 12 (ref 5–15)
BUN: 30 mg/dL — ABNORMAL HIGH (ref 6–20)
BUN: 30 mg/dL — ABNORMAL HIGH (ref 6–20)
CO2: 26 mmol/L (ref 22–32)
CO2: 25 mmol/L (ref 22–32)
Calcium: 8.8 mg/dL — ABNORMAL LOW (ref 8.9–10.3)
Calcium: 8.7 mg/dL — ABNORMAL LOW (ref 8.9–10.3)
Chloride: 103 mmol/L (ref 98–111)
Chloride: 104 mmol/L (ref 98–111)
Creatinine, Ser: 2.04 mg/dL — ABNORMAL HIGH (ref 0.44–1.00)
Creatinine, Ser: 1.94 mg/dL — ABNORMAL HIGH (ref 0.44–1.00)
GFR calc Af Amer: 31 mL/min — ABNORMAL LOW (ref >60)
GFR calc Af Amer: 33 mL/min — ABNORMAL LOW (ref >60)
GFR calc non Af Amer: 27 mL/min — ABNORMAL LOW (ref >60)
GFR calc non Af Amer: 28 mL/min — ABNORMAL LOW (ref >60)
Glucose, Bld: 109 mg/dL — ABNORMAL HIGH (ref 70–99)
Glucose, Bld: 221 mg/dL — ABNORMAL HIGH (ref 70–99)
Potassium: 3.8 mmol/L (ref 3.5–5.1)
Potassium: 4.2 mmol/L (ref 3.5–5.1)
Sodium: 140 mmol/L (ref 135–145)
Sodium: 141 mmol/L (ref 135–145)

## 2018-12-16 LAB — CULTURE, BLOOD (ROUTINE X 2)
Culture: NO GROWTH
Culture: NO GROWTH
Special Requests: ADEQUATE

## 2018-12-16 LAB — GLUCOSE, CAPILLARY
Glucose-Capillary: 85 mg/dL (ref 70–99)
Glucose-Capillary: 158 mg/dL — ABNORMAL HIGH (ref 70–99)
Glucose-Capillary: 157 mg/dL — ABNORMAL HIGH (ref 70–99)

## 2018-12-16 LAB — MAGNESIUM: Magnesium: 2.1 mg/dL (ref 1.7–2.4)

## 2018-12-16 MED ORDER — LACTATED RINGERS IV SOLN
INTRAVENOUS | Status: DC
  Administered 2018-12-16: 10:00:00 via INTRAVENOUS

## 2018-12-16 MED ORDER — AMOXICILLIN-POT CLAVULANATE 875-125 MG PO TABS: 1.0000 | ORAL_TABLET | Freq: Two times a day (BID) | ORAL | 0 refills | Status: DC

## 2018-12-16 MED ORDER — OXYCODONE HCL 5 MG PO TABS: 5.0000 mg | ORAL_TABLET | Freq: Three times a day (TID) | ORAL | 0 refills | Status: AC | PRN

## 2018-12-16 MED ORDER — LANTUS SOLOSTAR 100 UNIT/ML ~~LOC~~ SOPN: 50.0000 [IU] | PEN_INJECTOR | Freq: Two times a day (BID) | SUBCUTANEOUS | 0 refills | Status: DC

## 2018-12-16 NOTE — Progress Notes (Signed)
Patient ID: VIRNA LIVENGOOD, female   DOB: 1965-10-01, 53 y.o.   MRN: [id number] Patient's wound VAC is functioning well.  She has been independent with ambulation.  She is safe for discharge to home from an orthopedic standpoint.  She will continue with a wound VAC and I will follow-up in the office next week.

## 2018-12-16 NOTE — Progress Notes (Signed)
Patient discharged to home with instructions. 

## 2018-12-16 NOTE — TOC Transition Note (Signed)
Transition of Care [facility]) - CM/SW Discharge Note   Patient Details  Name: [name] MRN: [id number] Date of Birth: 01/07/1966  Transition of Care [facility]) CM/SW Contact:  Ninfa Meeker, RN Phone Number: 12/16/2018, 3:28 PM   Clinical Narrative:   Pt admitted with R foot cellulitis (small puncture wound). Patient underwent amputation of right great toe on 12/13/18.  Case manager spoke with patient concerning discharge plan. Patient politely declined Home Health services. She will have support at discharge. DME has been delivered to her room.    Final next level of care: Home/Self Care Barriers to Discharge: Continued Medical Work up   Patient Goals and CMS Choice Patient states their goals for this hospitalization and ongoing recovery are:: to go home    Choice offered to / list presented to : NA  Discharge Placement                       Discharge Plan and Services   Discharge Planning Services: CM Consult Post Acute Care Choice: Durable Medical Equipment          DME Arranged: 3-N-1 DME Agency: [facility] HH Arranged: Patient Refused [place] Agency: NA   Social Determinants of Health ([place]) Interventions     Readmission Risk Interventions Readmission Risk Prevention Plan 12/14/2018 07/06/2018  Transportation Screening Complete Complete  PCP or Specialist Appt within 5-7 Days - Complete  PCP or Specialist Appt within 3-5 Days Complete -  Home Care Screening - Complete  Medication Review (RN CM) - Complete  HRI or Home Care Consult Complete -  Social Work Consult for [place] Planning/Counseling Complete -  Palliative Care Screening Not Applicable -  Some recent data might be hidden

## 2018-12-16 NOTE — Progress Notes (Signed)
Unsuccessesful with collecting urine collection for sampling. Patient had a BM when toileting. Bladder scan has been completed with 166ml. Will continue to try and collect.

## 2018-12-16 NOTE — Discharge Summary (Signed)
**Note De-Identified vi Obfusction** Physicin Dischrge Summry  CECILEY BUIST QQP:[id number] DOB: Apr 23, 1966 DOA: 12/11/2018  PCP: Mosie Lukes, MD  Admit dte: 12/11/2018 Dischrge dte: 12/16/2018  Time spent: 40 minutes  Recommendtions for Outptient Follow-up:  1. Follow up outptient CBC/CMP 2. Ensure follow up with orthopedics outptient 3. Dischrged on ugmentin for 4 weeks per ortho recs 4. Attention to renl function in follow up. Instructed to void NSAIDs, hold lsix nd metformin.  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 (HCC)   S/P CABG x 3   Cellulitis of foot, right   Dibetic infection of right foot [facility])   Dischrge Condition: stble  Diet recommendtion: dibetic   Filed Weights   12/11/18 0940 12/11/18 1632 12/14/18 0500  Weight: 72.6 kg 70.3 kg 72.5 kg    History of present illness:  Per previous PN Beli Febo Dven[place]is  53 y.o.femlewith medicl history significnt foruncontrolled type 2 dibetes,peripherl vsculr disese sttus post left gret toe mputtion, hypertension, hypothyroidism, chronic depression, peripherl neuropthy, who ws dmitted to the hospitl service on 4/4 fter presented to the emergency room with 4 dys of right foot swelling nd redness nd found to hve Sepsis from cellulitis of right foot secondry to right gret toe bscess with suspected osteomyelitis.Seen by orthopedic surgery nd given risk fctors including severe protein clorie mlnutrition, felt best for mputtion of right gret toe. Ptient underwent this on 4/6.  Post op course  c/b AKI.  This ws slowly improving on dy of dischrge.  Her bx were chnged to ugmentin bsed on cx results.  Pln for outptient f/u with orthopedics nd PCP.   Hospitl Course:  Sepsis from underlying bscess of gret toe, right with secondry right foot cellulitis nd underlying history of peripherl vsculr disese:  met criteri for sepsis given leukocytosis, infected toe source, tchycrdi nd fever. Sepsis now resolved, sttus post mputtion. Antibiotics chnged to p.o. Augmentinfrom vnc/zosyn. Wound cx with enterococcus feclis (sensitive to mpicillin) nd stph simulns (oxcillin sensitive).  Abx chnged to ugmentin.  Pln for 4 week course per ortho recommendtion. Bcx NGTD  Acute Kidney Injury: bseline cretinine looks to be <1.0 in 2019.  At presenttion, cretinine ws 1.27, hs been stedily rising with big jump 4/8 to 2.19.  Consider ATN relted to sepsis, hypovolemi, relted to meds (ws on vnc/zosyn).  Improved tody nd slowly downtrending Discussed importnce of stying hydrted, holding lsix nd metformin for now until repet lbs.  Avoid NSAIDs. Follow up repet lbs outptient Follow UA (30 mg/dl protein, 0-5 RBC, few bcteri) Follow renl Kore (without hydro)  Dibetic polyneuropthy ssocited with type 2 dibetes mellitus ([place]) uncontrolled lso with dibetic retinopthy: A1c notes very poor control t 10.5. She tkes 50 units BID t home  Severe protein-clorie mlnutrition [facility]): pprecite RD ssistnce.  Coronry rtery disese involving ntive coronry rtery of ntive hert:  No CP t this time.  Hypothyroidism: synthroid  Procedures:  4/6 mputtion of R foot first ry, locl rerrngement of tissue for wound closure, vc ppliction  Consulttions:  orthopedics  Dischrge Exm: Vitls:   12/15/18 2123 12/16/18 0543  BP:  (!) 178/87  Pulse: 74 **Note De-Identified vi Obfusction** 55  Resp:  17  Temp:  98.7 F (37.1 C)  SpO2:  97%   No  complints. She'd like to go home tody. Discussed importnce of f/u with PCP for lbs, holding certin meds until clered by MD.  Generl: No cute distress. Crdiovsculr: Hert sounds show  regulr rte, nd rhythm Lungs: Cler to usculttion bilterlly Abdomen: Soft, nontender, nondistended  Neurologicl: Alert nd oriented 3. Moves ll extremities 4. Crnil nerves II through XII grossly intct. Skin: Wrm nd dry. No rshes or lesions. Extremities: R first toe mputtion, wound vc in plce   Dischrge Instructions   Dischrge Instructions    Amb Referrl to Nutrition nd Dibetic E   Complete by:  As directed    Diet - low sodium hert helthy   Complete by:  As directed    Dischrge instructions   Complete by:  As directed    You were seen for  dibetic foot infection.  You hd n mputtion of your right first toe due to this with orthopedics.   We'll send you home with 4 weeks of ntibiotics.  You should be nonweightbering on the right leg for 4 weeks.  Your wound vc will be continued for 1 week.   Plese follow up with Dr. Shrol Given s n outptient s scheduled.  You hd some kidney injury while you were here.  This my hve been relted to medictions.  Plese STOP your metformin until you hve follow up lbs with your PCP nd they cler you to resume this.  Plese sty hydrted over the next few dys.  Plese sk your PCP to obtin follow up lbs within the next few dys (preferbly erly next week).  Your kidney function is improving, but very slowly.  It will be importnt to void tking this tht cn worsen your kidney function (no NSAIDs - ibuprofen, dvil, nproxen, etc).  Plese stop your lsix s well to give your kidneys  brek.  Don't restrt this until your PCP sys its ok.   Return for new, recurrent, or worsening symptoms.  Plese sk your PCP to request records from this hospitliztion so they know wht ws done nd wht the next steps will be.    Elevte opertive extremity   Complete by:  As directed    Increse ctivity slowly   Complete by:  As directed    Negtive Pressure Wound Therpy - Incisionl   Complete by:  As directed    Negtive Pressure Wound Therpy - Incisionl   Complete by:  As directed    Plug in the portble wound VAC to mintin its chrge.   Non weight bering   Complete by:  As directed    Lterlity:  right   Extremity:  Lower   Non weight bering   Complete by:  As directed    Lterlity:  right   Extremity:  Lower     Allergies s of 12/16/2018      Rections   Lyric [pregblin] Other (See Comments)   MADE PATIENT VERY EMOTIONAL AND WOULD CRY EASILY   Morphine And Relted Nuse And Vomiting   Trmdol Nuse And Vomiting   Pt cnt tolerte this pin med.       Mediction List    STOP tking these medictions   furosemide 40 MG tblet Commonly known s:  Lsix   HYDROcodone-cetminophen 10-325 MG tblet Commonly known s:  NORCO   metFORMIN 1000 MG tblet Commonly known s:  Glucophge   Potssium Chloride **Note De-Identified vi Obfusction** ER 20 MEQ Tbcr   tiznidine 2 MG cpsule Commonly known s:  ZANAFLEX     TAKE these medictions   cetminophen 325 MG tblet Commonly known s:  TYLENOL Tke 2 tblets (650 mg totl) by mouth every 6 (six) hours s needed for mild pin or hedche.   mLODipine 5 MG tblet Commonly known s:  NORVASC Tke 1 tblet (5 mg totl) by mouth dily.   moxicillin-clvulnte 875-125 MG tblet Commonly known s:  AUGMENTIN Tke 1 tblet by mouth every 12 (twelve) hours for 28 dys. Wht chnged:  when to tke this   spirin EC 81 MG tblet Tke 81 mg by mouth dily. Wht chnged:  Another mediction with the sme nme ws removed. Continue tking this mediction, nd follow the directions you see here.   torvsttin 80 MG tblet Commonly known s:  LIPITOR Tke 1 tblet (80 mg totl) by mouth dily t 6 PM.   zelstine 0.1 % nsl spry Commonly known s:   Astelin Plce 2 sprys into both nostrils 2 (two) times dily. Use in ech nostril s directed   benzontte 100 MG cpsule Commonly known s:  TESSALON Tke 1 cpsule (100 mg totl) by mouth 3 (three) times dily s needed for cough.   escitloprm 10 MG tblet Commonly known s:  LEXAPRO TAKE 1 TABLET BY MOUTH EVERY DAY   fluticsone 50 MCG/ACT nsl spry Commonly known s:  FLONASE SPRAY 2 SPRAYS INTO EACH NOSTRIL EVERY DAY   gbpentin 100 MG cpsule Commonly known s:  NEURONTIN Tke 3 cpsules (300 mg totl) by mouth 3 (three) times dily.   isosorbide mononitrte 30 MG 24 hr tblet Commonly known s:  IMDUR Tke 0.5 tblets (15 mg totl) by mouth dily. For one month then stop.   Lntus SoloStr 100 UNIT/ML Solostr Pen Generic drug:  Insulin Glrgine Inject 50 Units into the skin 2 (two) times dily for 30 dys.   levothyroxine 25 MCG tblet Commonly known s:  SYNTHROID, LEVOTHROID TAKE 1 TABLET (25 MCG TOTAL) BY MOUTH DAILY BEFORE BREAKFAST. Wht chnged:  See the new instructions.   metoprolol trtrte 25 MG tblet Commonly known s:  LOPRESSOR Tke 1 tblet (25 mg totl) by mouth 2 (two) times dily.   ONE TOUCH ULTRA 2 w/Device Kit Check blood sugrs twice dily. Dx:E11.59   ONE TOUCH ULTRA TEST test strip Generic drug:  glucose blood USE ONCE DAILY TO CHECK BLOOD SUGAR. DX E11.9   oxyCODONE 5 MG immedite relese tblet Commonly known s:  Roxicodone Tke 1 tblet (5 mg totl) by mouth every 8 (eight) hours s needed for up to 3 dys.   Pen Needles 31G X 5 MM Misc 1 Syringe by Does not pply route dily. To use with Lntus Pen            [place] Medicl Equipment  (From dmission, onwrd)         Strt     Ordered   12/14/18 1141  For home use only DME Wlker rolling  Once    Question:  Ptient needs  wlker to tret with the following condition  Answer:  Abscess of gret toe of right foot   12/14/18 1140   12/12/18 1455  For home use  only DME Wlker rolling  Once    Question:  Ptient needs  wlker to tret with the following condition  Answer:  Debility   12/12/18 1455           Dischrge Cre **Note De-Identified vi Obfusction** Instructions  (From dmission, onwrd)         Strt     Ordered   12/16/18 0000  Non weight bering    Question Answer Comment  Lterlity right   Extremity Lower      12/16/18 0659   12/13/18 0000  Non weight bering    Question Answer Comment  Lterlity right   Extremity Lower      12/13/18 1521         Allergies  Allergen Rections  . Lyric [Pregblin] Other (See Comments)    MADE PATIENT VERY EMOTIONAL AND WOULD CRY EASILY  . Morphine And Relted Nuse And Vomiting  . Trmdol Nuse And Vomiting    Pt cnt tolerte this pin med.    Follow-up Informtion    [name], MD In 1 week.   Specilty:  Orthopedic Surgery Contct informtion: [place] [id number]            The results of significnt dignostics from this hospitliztion (including imging, microbiology, ncillry nd lbortory) re listed below for reference.    Significnt Dignostic Studies: Kore Renl  Result Dte: 12/15/2018 CLINICAL DATA:  Acute kidney injury EXAM: RENAL / URINARY TRACT ULTRASOUND COMPLETE COMPARISON:  None. FINDINGS: Right Kidney: Renl mesurements: 12.3 x 5.2 x 6.1 cm = volume: 205 mL . Echogenicity within norml limits. No mss or hydronephrosis visulized. Left Kidney: Renl mesurements: 12.1 x 5.7 x 5.3 cm = volume: 190 mL. Echogenicity within norml limits. No mss or hydronephrosis visulized. Bldder: Appers norml for degree of bldder distention. IMPRESSION: No cute findings.  No hydronephrosis. Electroniclly Signed   By: Rolm Bptise M.D.   On: 12/15/2018 12:35   Dg Foot Complete Right  Result Dte: 12/11/2018 CLINICAL DATA:  History of dibetes, now with wound involving the plntr surfce of the right foot. EXAM: RIGHT FOOT COMPLETE - 3+ VIEW  COMPARISON:  None. FINDINGS: There is  wound djcent subcutneous edem nd strnding djcent to the plntr surfce of the bse the gret toe. No discrete rdiopque foreign body. No discrete res of osteolysis to suggest osteomyelitis. Distl vsculr clcifictions. No frcture or disloction. Joint spces re preserved. No erosions. IMPRESSION: Wound involving the plntr surfce of the bse of the gret toe without frcture, disloction, rdiopque foreign body or rdiogrphic evidence osteomyelitis. Electroniclly Signed   By: Sndi Mriscl M.D.   On: 12/11/2018 10:25    Microbiology: Recent Results (from the pst 240 hour(s))  Culture, blood (routine x 2)     Sttus: None   Collection Time: 12/11/18 10:27 AM  Result Vlue Ref Rnge Sttus   Specimen Description   Finl    BLOOD BLOOD RIGHT ARM Performed t [facility], [address] [place], [place] [address]    Specil Requests   Finl    BOTTLES DRAWN AEROBIC AND ANAEROBIC Blood Culture dequte volume Performed t [facility], [address], [place], [place] [address]    Culture   Finl    NO GROWTH 5 DAYS Performed t [facility], [place] [address], [place], [place] [address]    Report Sttus 12/16/2018 FINAL  Finl  Culture, blood (routine x 2)     Sttus: None   Collection Time: 12/11/18 10:44 AM  Result Vlue Ref Rnge Sttus   Specimen Description   Finl    BLOOD BLOOD LEFT HAND Performed t [facility], [place] [place] **Note De-Identified vi Obfusction** [place], [place] [address]    Specil Requests   Finl    BOTTLES DRAWN AEROBIC ONLY Blood Culture results my not be optiml due to n indequte volume of blood received in culture bottles Performed t [facility], [address] [place], [place] [address]    Culture   Finl    NO GROWTH 5 DAYS Performed t [facility], [place] [address], [place], [place] [address]    Report Sttus 12/16/2018 FINAL  Finl  Wound or Superficil Culture      Sttus: None   Collection Time: 12/11/18 11:02 AM  Result Vlue Ref Rnge Sttus   Specimen Description   Finl    WOUND FOOT RIGHT Performed t [facility], [place] [address] [place], [place] [address]    Specil Requests   Finl    Norml Performed t [facility], [address] [place], [place] [address]    Grm Stin   Finl    NO WBC SEEN FEW GRAM POSITIVE COCCI IN PAIRS Performed t [facility], [place] [address], [place], [place] [address]    Culture   Finl    MODERATE ENTEROCOCCUS FAECALIS MODERATE STAPHYLOCOCCUS SIMULANS    Report Sttus 12/14/2018 FINAL  Finl   Orgnism ID, Bcteri ENTEROCOCCUS FAECALIS  Finl   Orgnism ID, Bcteri STAPHYLOCOCCUS SIMULANS  Finl      Susceptibility   Enterococcus feclis - MIC*    AMPICILLIN <=2 SENSITIVE Sensitive     VANCOMYCIN 1 SENSITIVE Sensitive     GENTAMICIN SYNERGY SENSITIVE Sensitive     * MODERATE ENTEROCOCCUS FAECALIS   Stphylococcus simulns - MIC*    CIPROFLOXACIN <=0.5 SENSITIVE Sensitive     ERYTHROMYCIN >=8 RESISTANT Resistnt     GENTAMICIN <=0.5 SENSITIVE Sensitive     OXACILLIN <=0.25 SENSITIVE Sensitive     TETRACYCLINE >=16 RESISTANT Resistnt     VANCOMYCIN 1 SENSITIVE Sensitive     TRIMETH/SULFA <=10 SENSITIVE Sensitive     CLINDAMYCIN <=0.25 SENSITIVE Sensitive     RIFAMPIN <=0.5 SENSITIVE Sensitive     Inducible Clindmycin NEGATIVE Sensitive     * MODERATE STAPHYLOCOCCUS SIMULANS  MRSA PCR Screening     Sttus: None   Collection Time: 12/13/18  5:23 AM  Result Vlue Ref Rnge Sttus   MRSA by PCR NEGATIVE NEGATIVE Finl    Comment:        The GeneXpert MRSA Assy (FDA pproved for NASAL specimens only), is one component of  comprehensive MRSA coloniztion surveillnce progrm. It is not intended to dignose MRSA infection nor to guide or monitor tretment for MRSA infections. Performed t [facility], [place] [address] [place], [place] [address]    Aerobic/Anerobic Culture (surgicl/deep wound)     Sttus: None (Preliminry result)   Collection Time: 12/13/18  2:50 PM  Result Vlue Ref Rnge Sttus   Specimen Description TISSUE RIGHT TOE  Finl   Specil Requests NONE  Finl   Grm Stin   Finl    FEW WBC PRESENT,BOTH PMN AND MONONUCLEAR NO ORGANISMS SEEN Performed t [facility], [address] [place],  [address]    Culture   Finl    FEW STAPHYLOCOCCUS SIMULANS CRITICAL RESULT CALLED TO, READ BACK BY AND VERIFIED WITH: RN N FELIX [id number] AT 17 AM BY CM NO ANAEROBES ISOLATED; CULTURE IN PROGRESS FOR 5 DAYS    Report Sttus PENDING  Incomplete   Orgnism ID, Bcteri STAPHYLOCOCCUS SIMULANS  Final      Susceptibility   Staphylococcus simulans - MIC*    CIPROFLOXACIN <=0.5 SENSITIVE Sensitive     ERYTHROMYCIN <=0.25 SENSITIVE Sensitive     GENTAMICIN <=0.5 SENSITIVE Sensitive     OXACILLIN <=0.25 SENSITIVE Sensitive     TETRACYCLINE <=1 SENSITIVE Sensitive     VANCOMYCIN <=0.5 SENSITIVE Sensitive     TRIMETH/SULFA <=10 SENSITIVE Sensitive     CLINDAMYCIN <=0.25 SENSITIVE Sensitive     RIFAMPIN <=0.5 SENSITIVE Sensitive     Inducible Clindamycin NEGATIVE Sensitive     * FEW STAPHYLOCOCCUS SIMULANS     Labs: Basic Metabolic Panel: Recent Labs  Lab 12/12/18 0523 12/14/18 0445 12/15/18 0529 12/16/18 0643 12/16/18 1345  NA 135 137 135 140 141  K 3.7 3.7 3.8 3.8 4.2  CL 100 103 101 103 104  CO2 _0 GLUCOSE 180* 215* 269* 109* 221*  BUN 24* 22* 37* 30* 30*  CREATININE 1.38* 1.41* 2.19* 2.04* 1.94*  CALCIUM 8.5* 8.3* 8.4* 8.8* 8.7*  MG  --   --   --  2.1  --    Liver Function Tests: Recent Labs  Lab 12/11/18 1003  AST 14*  ALT 17  ALKPHOS 92  BILITOT 0.7  PROT 6.9  ALBUMIN 2.4*   No results for input(s): LIPASE, AMYLASE in the last 168 hours. No results for input(s): AMMONIA in the last 168 hours. CBC: Recent Labs  Lab 12/11/18 1003 12/12/18 0523 12/14/18 0445  12/15/18 0529 12/16/18 0643  WBC 14.8* 15.3* 14.6* 13.8* 13.0*  NEUTROABS 12.3* 12.2*  --   --   --   HGB 10.1* 10.6* 9.4* 8.8* 9.3*  HCT 32.5* 33.4* 29.9* 27.8* 29.7*  MCV 85.8 86.8 83.8 85.0 85.3  PLT 458* 497* 531* 574* 662*   Cardiac Enzymes: No results for input(s): CKTOTAL, CKMB, CKMBINDEX, TROPONINI in the last 168 hours. BNP: BNP (last 3 results) Recent Labs    06/23/18 2116  BNP 335.2*    ProBNP (last 3 results) No results for input(s): PROBNP in the last 8760 hours.  CBG: Recent Labs  Lab 12/15/18 1227 12/15/18 1818 12/15/18 2104 12/16/18 0802 12/16/18 1132  GLUCAP 235* 99 104* 85 158*       Signed:  Fayrene Helper MD.  [facility] 12/16/2018, 3:09 PM

## 2018-12-16 NOTE — Progress Notes (Signed)
Physical Therapy Treatment Patient Details Name: [name] MRN: [id number] DOB: [birth year] Today's Date: 12/16/2018    History of Present Illness Pt admitted with R foot cellulitis (small puncture wound from childs toy).  Pt with hx of PVD, peripheral neuropathy, DM, CHF, CABG, and CVA, s/p R 1st ray amputation 12/13/2018    PT Comments    Pt is much improved with NWB status through R LE with movement today. Pt requires min guard for transfers and min A for ambulation from chair to bed, min guard for management of LE back into bed. Pt educated on car transfers for possible d/c home today.     Follow Up Recommendations  Home health PT     Equipment Recommendations  Rolling walker with 5" wheels;3in1 (PT)       Precautions / Restrictions Precautions Precautions: Fall Required Braces or Orthoses: Other Brace(surgical shoe) Restrictions Weight Bearing Restrictions: Yes RLE Weight Bearing: Non weight bearing    Mobility  Bed Mobility Overal bed mobility: Needs Assistance Bed Mobility: Sit to Supine       Sit to supine: Min guard   General bed mobility comments: OOB in recliner on entry. min guard for safety and management of Wound vac into bed   Transfers Overall transfer level: Needs assistance Equipment used: Rolling walker (2 wheeled) Transfers: Sit to/from Stand Sit to Stand: Min guard         General transfer comment: min guard for sit to stand from recliner, vc for scooting hips further to edge of chair and for increased extension of R LE   Ambulation/Gait Ambulation/Gait assistance: Min assist Gait Distance (Feet): 10 Feet Assistive device: Rolling walker (2 wheeled) Gait Pattern/deviations: Decreased step length - left;Step-to pattern;Antalgic(hop to pattern) Gait velocity: decr Gait velocity interpretation: <1.31 ft/sec, indicative of household ambulator General Gait Details: minA for steadying, pt able to maintain NWB 90% of time with hopping from  recliner to bed         Balance Overall balance assessment: Needs assistance Sitting-balance support: Feet supported;No upper extremity supported Sitting balance-Leahy Scale: Good     Standing balance support: Bilateral upper extremity supported Standing balance-Leahy Scale: Poor Standing balance comment: to maintain static stand maintaining NWB                            Cognition Arousal/Alertness: Awake/alert Behavior During Therapy: WFL for tasks assessed/performed Overall Cognitive Status: Within Functional Limits for tasks assessed                                               Pertinent Vitals/Pain Pain Assessment: Faces Faces Pain Scale: Hurts little more Pain Location: R foot with mobility Pain Descriptors / Indicators: Guarding;Sore Pain Intervention(s): Limited activity within patient's tolerance;Monitored during session;Repositioned           PT Goals (current goals can now be found in the care plan section) Acute Rehab PT Goals Patient Stated Goal: Regain IND PT Goal Formulation: With patient Time For Goal Achievement: 12/19/18 Potential to Achieve Goals: Good Progress towards PT goals: Progressing toward goals    Frequency    Min 3X/week      PT Plan Current plan remains appropriate       AM-PAC PT "6 Clicks" Mobility   Outcome Measure  Help needed turning from your back  to your side while in a flat bed without using bedrails?: None Help needed moving from lying on your back to sitting on the side of a flat bed without using bedrails?: None Help needed moving to and from a bed to a chair (including a wheelchair)?: A Little Help needed standing up from a chair using your arms (e.g., wheelchair or bedside chair)?: A Little Help needed to walk in hospital room?: A Little Help needed climbing 3-5 steps with a railing? : A Little 6 Click Score: 20    End of Session Equipment Utilized During Treatment: Gait  belt Activity Tolerance: Patient tolerated treatment well Patient left: in bed;with call bell/phone within reach;with nursing/sitter in room Nurse Communication: Mobility status PT Visit Diagnosis: Difficulty in walking, not elsewhere classified (R26.2);Unsteadiness on feet (R26.81)     Time: 8590-9311 PT Time Calculation (min) (ACUTE ONLY): 16 min  Charges:  $Gait Training: 8-22 mins                     Elizabeth B. Migdalia Dk PT, DPT Acute Rehabilitation Services Pager [phone] Office [phone]    [place] 12/16/2018, 2:30 PM

## 2018-12-18 LAB — AEROBIC/ANAEROBIC CULTURE W GRAM STAIN (SURGICAL/DEEP WOUND)

## 2018-12-18 LAB — AEROBIC/ANAEROBIC CULTURE (SURGICAL/DEEP WOUND)

## 2018-12-21 ENCOUNTER — Telehealth (INDEPENDENT_AMBULATORY_CARE_PROVIDER_SITE_OTHER): Payer: Self-pay | Admitting: Orthopedic Surgery

## 2018-12-21 NOTE — Telephone Encounter (Signed)
Patient called stating that she had surgery last week with Dr. [name] Given.  I set her post op appointment up already, but she would like to talk to someone about her wound vac. She is wanting to know if it will hurt her foot if it is not coming on.  CB#[phone].  Thank you.

## 2018-12-21 NOTE — Telephone Encounter (Signed)
Pt called triage about wound vac malfunctioning and was advised to come in today but pt could not make it so decided to make appt for 12/22/2018 w/Erin.

## 2018-12-22 ENCOUNTER — Ambulatory Visit (INDEPENDENT_AMBULATORY_CARE_PROVIDER_SITE_OTHER): Payer: BLUE CROSS/BLUE SHIELD | Admitting: Family

## 2018-12-22 ENCOUNTER — Encounter (INDEPENDENT_AMBULATORY_CARE_PROVIDER_SITE_OTHER): Payer: Self-pay | Admitting: Family

## 2018-12-22 ENCOUNTER — Other Ambulatory Visit: Payer: Self-pay

## 2018-12-22 DIAGNOSIS — S98111A Complete traumatic amputation of right great toe, initial encounter: Secondary | ICD-10-CM

## 2018-12-22 DIAGNOSIS — Z89411 Acquired absence of right great toe: Secondary | ICD-10-CM

## 2018-12-22 HISTORY — DX: Complete traumatic amputation of right great toe, initial encounter: S98.111A

## 2018-12-22 NOTE — Progress Notes (Signed)
Post-Op Visit Note   Patient: [name]           Date of Birth: 11-Feb-1966           MRN: [id number] Visit Date: 12/22/2018 PCP: Mosie Lukes, MD  Chief Complaint:  Chief Complaint  Patient presents with  . Right Foot - Routine Post Op    12/13/2018 right 1st ray amputation local tissue arrangement     HPI:  HPI The patient is a 53 year old woman seen today status post right first ray amputation on 12/13/18.  Ortho Exam Incision well approximated with sutures. Maceration to sole of foot. No erythema. Dried bloody drainage. Moderate edema.  Visit Diagnoses:  1. Amputation of right great toe (HCC)     Plan: elevate for swelling. Daily dial soap cleansing dry dressings. No weight bearing on right. Follow up in office in 2 weeks, evaluate for suture removal.  Follow-Up Instructions: Return in about 2 weeks (around 01/05/2019).   Imaging: No results found.  Orders:  No orders of the defined types were placed in this encounter.  No orders of the defined types were placed in this encounter.    PMFS History: Patient Active Problem List   Diagnosis Date Noted  . Amputation of right great toe ([place]) 12/22/2018  . Abscess of great toe, right   . Diabetic infection of right foot ([place])   . Cellulitis of foot, right 12/11/2018  . S/P CABG x 3 06/28/2018  . Coronary artery disease involving native coronary artery of native heart with angina pectoris ([place]) 06/25/2018  . Type 2 diabetes mellitus with vascular disease ([place]) 06/24/2018  . Acute on chronic diastolic CHF (congestive heart failure) ([place]) 06/24/2018  . Non-ST elevation (NSTEMI) myocardial infarction ([place]) 06/24/2018  . Chest pain 06/23/2018  . Glaucoma 09/17/2017  . Diabetic retinopathy ([place]) 09/17/2017  . Otitis media 11/10/2016  . Decreased visual acuity 11/10/2016  . Spinal stenosis, lumbar region with neurogenic claudication 10/22/2016  . Other spondylosis with radiculopathy, lumbar region 10/13/2016  .  Lipoma of abdominal wall 10/05/2016  . Acute osteomyelitis of toe, left ([place]) 09/05/2016  . Severe protein-calorie malnutrition ([place]) 09/04/2016  . Anemia of chronic disease 09/04/2016  . Diabetic foot infection ([place]) 08/26/2016  . Diabetic polyneuropathy associated with type 2 diabetes mellitus ([place]) 05/07/2016  . Orthostatic hypotension 05/07/2016  . Cerebrovascular disease 04/27/2016  . ICH (intracerebral hemorrhage) ([place]) 03/30/2016  . Visit for preventive health examination 05/21/2015  . Breast cancer screening 05/21/2015  . Arm lesion 07/02/2014  . Pain in joint, ankle and foot 04/28/2014  . Pain in limb 01/13/2014  . Neuropathic pain of both legs 01/13/2014  . Hip pain 01/12/2014  . Allergic rhinitis 03/10/2013  . Back pain 03/08/2013  . Depression 12/18/2012  . PVD (peripheral vascular disease) ([place]) 01/21/2012  . Wound of left leg 12/22/2011  . Hyperlipidemia 12/06/2010  . Overweight(278.02) 12/06/2010  . RESTLESS LEG SYNDROME 10/25/2010  . Migraine without aura 10/07/2010  . Hereditary and idiopathic peripheral neuropathy 10/07/2010  . Essential hypertension, benign 10/07/2010  . DISTURBANCE OF SKIN SENSATION 10/07/2010  . HEART MURMUR, HX OF 10/07/2010  . NEPHROLITHIASIS, HX OF 10/07/2010   Past Medical History:  Diagnosis Date  . Acute on chronic diastolic CHF (congestive heart failure) ([place]) 06/24/2018  . Anxiety   . COMMON MIGRAINE 10/07/2010  . Decreased visual acuity 11/10/2016  . Depression 12/18/2012  . Diabetes mellitus type II, uncontrolled ([place]) 10/07/2010   Qualifier: Diagnosis of  By: Charlett Blake MD, [name]    . Diabetic foot infection ([place]) 08/26/2016  . Disturbance of skin sensation 10/07/2010  . Heart murmur   . History of kidney stones    "years ago"  . Hyperlipidemia 12/06/2010  . Hypertension   . Lipoma of abdominal wall 10/05/2016  . Overweight(278.02) 12/06/2010  . PERIPHERAL NEUROPATHY, FEET 10/07/2010  . PVD (peripheral vascular disease) ([place])  01/21/2012  . RESTLESS LEG SYNDROME 10/25/2010  . Stroke [facility]) 2014, 2017   most recently in 2/17 - intracerebral hemorrhage    Family History  Problem Relation Age of Onset  . Arthritis Mother   . Stroke Brother        previous smoker  . Alcohol abuse Brother        in remission  . Leukemia Brother   . Diabetes Paternal Grandmother   . Healthy Son     Past Surgical History:  Procedure Laterality Date  . AMPUTATION Right 12/13/2018   Procedure: AMPUTATION RIGHT GREAT TOE, LOCAL RELOCATION OF TISSUE FOR WOUND CLOSURE 9cm x 3cm, VAC APPLICATION;  Surgeon: [name], MD;  Location: [place];  Service: Orthopedics;  Laterality: Right;  . AMPUTATION TOE Left 09/09/2016   Procedure: AMPUTATION OF LEFT GREAT TOE;  Surgeon: Milly Jakob, MD;  Location: [place];  Service: Orthopedics;  Laterality: Left;  . CORONARY ARTERY BYPASS GRAFT N/A 06/28/2018   Procedure: CORONARY ARTERY BYPASS GRAFTING (CABG) times  four, using left internal mammary artery, endoscopically harvested right saphenous vein, and harvested left radial artery;  Surgeon: [name], MD;  Location: [place];  Service: Open Heart Surgery;  Laterality: N/A;  . ENDOVEIN HARVEST OF GREATER SAPHENOUS VEIN Right 06/28/2018   Procedure: ENDOVEIN HARVEST OF GREATER SAPHENOUS VEIN;  Surgeon: [name], MD;  Location: [place];  Service: Open Heart Surgery;  Laterality: Right;  . LEFT HEART CATH AND CORONARY ANGIOGRAPHY N/A 06/24/2018   Procedure: LEFT HEART CATH AND CORONARY ANGIOGRAPHY;  Surgeon: Troy Sine, MD;  Location: [place] [facility];  Service: Cardiovascular;  Laterality: N/A;  . RADIAL ARTERY HARVEST Left 06/28/2018   Procedure: RADIAL ARTERY HARVEST;  Surgeon: [name], MD;  Location: [place];  Service: Open Heart Surgery;  Laterality: Left;  . TEE WITHOUT CARDIOVERSION N/A 06/28/2018   Procedure: TRANSESOPHAGEAL ECHOCARDIOGRAM (TEE);  Surgeon: [name], MD;   Location: [place];  Service: Open Heart Surgery;  Laterality: N/A;  . WISDOM TOOTH EXTRACTION     Social History   Occupational History  . Occupation: Glass blower/designer  Tobacco Use  . Smoking status: Former Smoker    Packs/day: 0.50    Last attempt to quit: 09/08/2005    Years since quitting: 13.2  . Smokeless tobacco: Never Used  Substance and Sexual Activity  . Alcohol use: No    Alcohol/week: 0.0 standard drinks  . Drug use: Not Currently  . Sexual activity: Yes    Partners: Male    Birth control/protection: None

## 2018-12-23 ENCOUNTER — Inpatient Hospital Stay (INDEPENDENT_AMBULATORY_CARE_PROVIDER_SITE_OTHER): Payer: BLUE CROSS/BLUE SHIELD | Admitting: Orthopedic Surgery

## 2018-12-30 ENCOUNTER — Ambulatory Visit (INDEPENDENT_AMBULATORY_CARE_PROVIDER_SITE_OTHER): Payer: Self-pay | Admitting: Physician Assistant

## 2018-12-30 ENCOUNTER — Other Ambulatory Visit: Payer: Self-pay

## 2018-12-30 ENCOUNTER — Encounter (HOSPITAL_COMMUNITY): Payer: Self-pay

## 2018-12-30 ENCOUNTER — Encounter (INDEPENDENT_AMBULATORY_CARE_PROVIDER_SITE_OTHER): Payer: Self-pay | Admitting: Orthopedic Surgery

## 2018-12-30 ENCOUNTER — Ambulatory Visit (INDEPENDENT_AMBULATORY_CARE_PROVIDER_SITE_OTHER): Payer: BLUE CROSS/BLUE SHIELD | Admitting: Orthopedic Surgery

## 2018-12-30 VITALS — Ht 62.0 in | Wt 159.0 lb

## 2018-12-30 DIAGNOSIS — Z89411 Acquired absence of right great toe: Secondary | ICD-10-CM

## 2018-12-30 DIAGNOSIS — S98111A Complete traumatic amputation of right great toe, initial encounter: Secondary | ICD-10-CM

## 2018-12-30 DIAGNOSIS — I96 Gangrene, not elsewhere classified: Secondary | ICD-10-CM

## 2018-12-30 NOTE — Progress Notes (Signed)

## 2018-12-30 NOTE — Progress Notes (Signed)
PCP - Dr. Charlett Blake  Cardiologist - Dr. Roxan Hockey  Chest x-ray - 07/13/18 (E)  EKG - 06/29/18 (E)  Stress Test - Denies  ECHO - 06/28/18 (E)  Cardiac Cath - 06/24/18 (E)  AICD-na PM-na LOOP-na  Sleep Study - Denies CPAP - None  LABS- 12/31/2018: CBC, BMP, A1C  ASA- LD- 4/23  HA1C- 12/12/2018: 10.5 Fasting Blood Sugar - 50-175 Checks Blood Sugar __2___ times a day  Anesthesia-Yes- cardiac history/abnormal A1C- spoke with PA James  Pt denies having chest pain, sob, or fever, but did c/o N/V/D x2 days, during the pre-op phone call. Pt sts she informed Dr. [name] Given at her pre-op appointment earlier today, and he still wants her to proceed with the procedure. All instructions explained to the pt, with a verbal understanding of the material, including:   . THE NIGHT BEFORE SURGERY, take _____25______ units of ____Lantus_______insulin.    . THE MORNING OF SURGERY, take _______25______ units of ____Lantus______insulin.  . If your CBG is greater than 220 mg/dL, call the number given for further instructions.  How do I manage my blood sugar before surgery? . Check your blood sugar at least 4 times a day, starting 2 days before surgery, to make sure that the level is not too high or low. o Check your blood sugar the morning of your surgery when you wake up and every 2 hours until you get to the Short Stay unit. . If your blood sugar is less than 70 mg/dL, you will need to treat for low blood sugar: o Do not take insulin. o Treat a low blood sugar (less than 70 mg/dL) with  cup of clear juice (cranberry or apple), 4 glucose tablets, OR glucose gel. Recheck blood sugar in 15 minutes after treatment (to make sure it is greater than 70 mg/dL). If your blood sugar is not greater than 70 mg/dL on recheck, call [phone] o  for further instructions. . Report your blood sugar to the short stay nurse when you get to Short Stay.  . If you are admitted to the hospital after surgery: o Your  blood sugar will be checked by the staff and you will probably be given insulin after surgery (instead of oral diabetes medicines) to make sure you have good blood sugar levels. o The goal for blood sugar control after surgery is 80-180 mg/dL.   Pt agrees to go over the instructions while at home for a better understanding. The opportunity to ask questions was provided.

## 2018-12-30 NOTE — Anesthesia Preprocedure Evaluation (Addendum)
Anesthesia Evaluation  Patient identified by MRN, date of birth, ID band Patient awake    Reviewed: Allergy & Precautions, NPO status , Patient's Chart, lab work & pertinent test results  Airway Mallampati: II  TM Distance: >3 FB     Dental   Pulmonary neg pulmonary ROS, former smoker,    breath sounds clear to auscultation       Cardiovascular hypertension, + angina + Past MI, + Peripheral Vascular Disease and +CHF  + Valvular Problems/Murmurs  Rhythm:Regular Rate:Normal     Neuro/Psych    GI/Hepatic negative GI ROS, Neg liver ROS,   Endo/Other  diabetes  Renal/GU      Musculoskeletal   Abdominal   Peds  Hematology  (+) anemia ,   Anesthesia Other Findings   Reproductive/Obstetrics                            Anesthesia Physical Anesthesia Plan  ASA: III  Anesthesia Plan: General   Post-op Pain Management:    Induction: Intravenous  PONV Risk Score and Plan: Ondansetron, Midazolam, Dexamethasone and Treatment may vary due to age or medical condition  Airway Management Planned: LMA and Oral ETT  Additional Equipment:   Intra-op Plan:   Post-operative Plan: Extubation in OR  Informed Consent: I have reviewed the patients History and Physical, chart, labs and discussed the procedure including the risks, benefits and alternatives for the proposed anesthesia with the patient or authorized representative who has indicated his/her understanding and acceptance.     Dental advisory given  Plan Discussed with: CRNA and Anesthesiologist  Anesthesia Plan Comments: (Recently admitted 4/4-12/16/18 for sepsis secondary to right foot cellulitis. S/p right 1st ray amputation 12/13/2018. Per Dr. Jess Barters note 12/30/18 the pt now has gangrenous changes, wound dehiscence, and painful cellulitis. The pt reports nausea, diarrhea, poor appetite. Dr. [name] Given aware.   NSTEMI 06/23/18. Ultimately underwent  CABG x4 on 06/28/2018. She has not seen cardiology since discharge. She did see Dr. Roxan Hockey 09/17/18 and per his note she was doing much better overall at that time, was arranging for cardiology f/u but evidently never happened.        )      Anesthesia Quick Evaluation

## 2018-12-30 NOTE — Progress Notes (Signed)
Office Visit Note   Patient: [name]           Date of Birth: 1966/05/26           MRN: [id number] Visit Date: 12/30/2018              Requested by: Mosie Lukes, MD [address] [place], [place] [address] PCP: Mosie Lukes, MD  Chief Complaint  Patient presents with  . Right Foot - Routine Post Op    12/13/2018 right 1st ray amputation       HPI: Patient is a 53 year old woman who is 2 weeks status post right foot first ray amputation.  Patient has noticed increased gangrenous changes wound dehiscence and painful cellulitis.  Patient states she does not feel well and states she has trouble eating.  She is currently on Augmentin twice a day.  Patient's temperature is 99.1.  Assessment & Plan: Visit Diagnoses:  1. Amputation of right great toe ([place])   2. Gangrene of right foot ([place])     Plan: Plan: With patient's progressive gangrenous changes 2 weeks out from surgery with cellulitis and pain we will plan to proceed with a transtibial amputation.  Discussed that patient does have good circulation to the ankle her main problem is the microcirculation in the midfoot and forefoot region and that most of her symptoms should resolve with the amputation.  Discussed that if she participates well with therapy she may be a good candidate for inpatient rehabilitation.  We will set up both inpatient and outpatient rehab.  Her son was brought in the room all questions were encouraged and answered and they both agree to proceed with surgery.  Again discussed the importance of glucose control and proper nutrition including protein supplements recommended protein supplements twice a day with whey based protein  Follow-Up Instructions: Return in about 2 weeks (around 01/13/2019).   Ortho Exam  Patient is alert, oriented, no adenopathy, well-dressed, normal affect, normal respiratory effort. Patient's temperature is 99.1.  She has progressive gangrenous full-thickness  dehiscence of the surgical incision.  There is exposed bone there is cellulitis that is tender to palpation and there is clear drainage no purulence.  There is no ascending cellulitis though her calf is tender to palpation there is no crepitation with palpation of the calf.  Patient's most recent hemoglobin A1c is 10.5.  Her recent albumin is 2.4.  Imaging: No results found. No images are attached to the encounter.  Labs: Lab Results  Component Value Date   HGBA1C 10.5 (H) 12/12/2018   HGBA1C 11.1 (H) 06/26/2018   HGBA1C 9.5 (H) 11/17/2017   ESRSEDRATE 130 (H) 12/12/2018   ESRSEDRATE 115 (H) 06/24/2018   ESRSEDRATE 107 (H) 09/04/2016   CRP 28.2 (H) 12/12/2018   CRP 7.2 (H) 09/04/2016   CRP 0.7 03/21/2015   LABURIC 3.2 06/13/2014   LABURIC 3.1 02/09/2014   REPTSTATUS 12/18/2018 FINAL 12/13/2018   GRAMSTAIN  12/13/2018    FEW WBC PRESENT,BOTH PMN AND MONONUCLEAR NO ORGANISMS SEEN    CULT  12/13/2018    FEW STAPHYLOCOCCUS SIMULANS CRITICAL RESULT CALLED TO, READ BACK BY AND VERIFIED WITH: RN N FELIX [id number] AT 63 AM BY CM NO ANAEROBES ISOLATED Performed at [facility], [place] [address] [place], [place] [address]    [place] 12/13/2018     Lab Results  Component Value Date   ALBUMIN 2.4 (L) 12/11/2018   ALBUMIN 3.4 (L) 07/20/2018  ALBUMIN 2.6 (L) 06/24/2018   PREALBUMIN 12.7 (L) 09/04/2016   LABURIC 3.2 06/13/2014   LABURIC 3.1 02/09/2014    Body mass index is 29.08 kg/m.  Orders:  No orders of the defined types were placed in this encounter.  No orders of the defined types were placed in this encounter.    Procedures: No procedures performed  Clinical Data: No additional findings.  ROS:  All other systems negative, except as noted in the HPI. Review of Systems  Objective: Vital Signs: Ht 5\' 2"  (1.575 m)   Wt 159 lb (72.1 kg)   LMP 02/12/2011 (Exact Date)   BMI 29.08 kg/m   Specialty Comments:  No specialty comments  available.  PMFS History: Patient Active Problem List   Diagnosis Date Noted  . Amputation of right great toe ([place]) 12/22/2018  . Abscess of great toe, right   . Diabetic infection of right foot ([place])   . Cellulitis of foot, right 12/11/2018  . S/P CABG x 3 06/28/2018  . Coronary artery disease involving native coronary artery of native heart with angina pectoris ([place]) 06/25/2018  . Type 2 diabetes mellitus with vascular disease ([place]) 06/24/2018  . Acute on chronic diastolic CHF (congestive heart failure) ([place]) 06/24/2018  . Non-ST elevation (NSTEMI) myocardial infarction ([place]) 06/24/2018  . Chest pain 06/23/2018  . Glaucoma 09/17/2017  . Diabetic retinopathy ([place]) 09/17/2017  . Otitis media 11/10/2016  . Decreased visual acuity 11/10/2016  . Spinal stenosis, lumbar region with neurogenic claudication 10/22/2016  . Other spondylosis with radiculopathy, lumbar region 10/13/2016  . Lipoma of abdominal wall 10/05/2016  . Acute osteomyelitis of toe, left ([place]) 09/05/2016  . Severe protein-calorie malnutrition ([place]) 09/04/2016  . Anemia of chronic disease 09/04/2016  . Diabetic foot infection ([place]) 08/26/2016  . Diabetic polyneuropathy associated with type 2 diabetes mellitus ([place]) 05/07/2016  . Orthostatic hypotension 05/07/2016  . Cerebrovascular disease 04/27/2016  . ICH (intracerebral hemorrhage) ([place]) 03/30/2016  . Visit for preventive health examination 05/21/2015  . Breast cancer screening 05/21/2015  . Arm lesion 07/02/2014  . Pain in joint, ankle and foot 04/28/2014  . Pain in limb 01/13/2014  . Neuropathic pain of both legs 01/13/2014  . Hip pain 01/12/2014  . Allergic rhinitis 03/10/2013  . Back pain 03/08/2013  . Depression 12/18/2012  . PVD (peripheral vascular disease) ([place]) 01/21/2012  . Wound of left leg 12/22/2011  . Hyperlipidemia 12/06/2010  . Overweight(278.02) 12/06/2010  . RESTLESS LEG SYNDROME 10/25/2010  . Migraine without aura 10/07/2010  . Hereditary  and idiopathic peripheral neuropathy 10/07/2010  . Essential hypertension, benign 10/07/2010  . DISTURBANCE OF SKIN SENSATION 10/07/2010  . HEART MURMUR, HX OF 10/07/2010  . NEPHROLITHIASIS, HX OF 10/07/2010   Past Medical History:  Diagnosis Date  . Acute on chronic diastolic CHF (congestive heart failure) ([place]) 06/24/2018  . Anxiety   . COMMON MIGRAINE 10/07/2010  . Decreased visual acuity 11/10/2016  . Depression 12/18/2012  . Diabetes mellitus type II, uncontrolled ([place]) 10/07/2010   Qualifier: Diagnosis of  By: Charlett Blake MD, [name]    . Diabetic foot infection ([place]) 08/26/2016  . Disturbance of skin sensation 10/07/2010  . Heart murmur   . History of kidney stones    "years ago"  . Hyperlipidemia 12/06/2010  . Hypertension   . Lipoma of abdominal wall 10/05/2016  . Overweight(278.02) 12/06/2010  . PERIPHERAL NEUROPATHY, FEET 10/07/2010  . PVD (peripheral vascular disease) ([place]) 01/21/2012  . RESTLESS LEG SYNDROME 10/25/2010  . Stroke [facility]) 2014, 2017  most recently in 2/17 - intracerebral hemorrhage    Family History  Problem Relation Age of Onset  . Arthritis Mother   . Stroke Brother        previous smoker  . Alcohol abuse Brother        in remission  . Leukemia Brother   . Diabetes Paternal Grandmother   . Healthy Son     Past Surgical History:  Procedure Laterality Date  . AMPUTATION Right 12/13/2018   Procedure: AMPUTATION RIGHT GREAT TOE, LOCAL RELOCATION OF TISSUE FOR WOUND CLOSURE 9cm x 3cm, VAC APPLICATION;  Surgeon: [name], MD;  Location: [place];  Service: Orthopedics;  Laterality: Right;  . AMPUTATION TOE Left 09/09/2016   Procedure: AMPUTATION OF LEFT GREAT TOE;  Surgeon: Milly Jakob, MD;  Location: [place];  Service: Orthopedics;  Laterality: Left;  . CORONARY ARTERY BYPASS GRAFT N/A 06/28/2018   Procedure: CORONARY ARTERY BYPASS GRAFTING (CABG) times  four, using left internal mammary artery, endoscopically harvested right saphenous vein,  and harvested left radial artery;  Surgeon: [name], MD;  Location: [place];  Service: Open Heart Surgery;  Laterality: N/A;  . ENDOVEIN HARVEST OF GREATER SAPHENOUS VEIN Right 06/28/2018   Procedure: ENDOVEIN HARVEST OF GREATER SAPHENOUS VEIN;  Surgeon: [name], MD;  Location: [place];  Service: Open Heart Surgery;  Laterality: Right;  . LEFT HEART CATH AND CORONARY ANGIOGRAPHY N/A 06/24/2018   Procedure: LEFT HEART CATH AND CORONARY ANGIOGRAPHY;  Surgeon: Troy Sine, MD;  Location: [place] [facility];  Service: Cardiovascular;  Laterality: N/A;  . RADIAL ARTERY HARVEST Left 06/28/2018   Procedure: RADIAL ARTERY HARVEST;  Surgeon: [name], MD;  Location: [place];  Service: Open Heart Surgery;  Laterality: Left;  . TEE WITHOUT CARDIOVERSION N/A 06/28/2018   Procedure: TRANSESOPHAGEAL ECHOCARDIOGRAM (TEE);  Surgeon: [name], MD;  Location: [place];  Service: Open Heart Surgery;  Laterality: N/A;  . WISDOM TOOTH EXTRACTION     Social History   Occupational History  . Occupation: Glass blower/designer  Tobacco Use  . Smoking status: Former Smoker    Packs/day: 0.50    Last attempt to quit: 09/08/2005    Years since quitting: 13.3  . Smokeless tobacco: Never Used  Substance and Sexual Activity  . Alcohol use: No    Alcohol/week: 0.0 standard drinks  . Drug use: Not Currently  . Sexual activity: Yes    Partners: Male    Birth control/protection: None

## 2018-12-31 ENCOUNTER — Encounter (HOSPITAL_COMMUNITY): Payer: Self-pay

## 2018-12-31 ENCOUNTER — Inpatient Hospital Stay (HOSPITAL_COMMUNITY)
Admission: RE | Admit: 2018-12-31 | Discharge: 2019-01-05 | DRG: 475 | Disposition: A | Discharge status: Rehabilitation Facility | Payer: BLUE CROSS/BLUE SHIELD | Attending: Orthopedic Surgery | Admitting: Orthopedic Surgery

## 2018-12-31 ENCOUNTER — Encounter (HOSPITAL_COMMUNITY)
Admission: RE | Disposition: A | Discharge status: Rehabilitation Facility | Payer: Self-pay | Source: Home / Self Care | Attending: Orthopedic Surgery

## 2018-12-31 ENCOUNTER — Inpatient Hospital Stay (HOSPITAL_COMMUNITY): Payer: BLUE CROSS/BLUE SHIELD | Admitting: Physician Assistant

## 2018-12-31 ENCOUNTER — Other Ambulatory Visit: Payer: Self-pay

## 2018-12-31 DIAGNOSIS — L03115 Cellulitis of right lower limb: Secondary | ICD-10-CM | POA: Diagnosis not present

## 2018-12-31 DIAGNOSIS — Z87891 Personal history of nicotine dependence: Secondary | ICD-10-CM | POA: Diagnosis not present

## 2018-12-31 DIAGNOSIS — I130 Hypertensive heart and chronic kidney disease with heart failure and stage 1 through stage 4 chronic kidney disease, or unspecified chronic kidney disease: Secondary | ICD-10-CM | POA: Diagnosis not present

## 2018-12-31 DIAGNOSIS — I10 Essential (primary) hypertension: Secondary | ICD-10-CM | POA: Diagnosis not present

## 2018-12-31 DIAGNOSIS — R0989 Other specified symptoms and signs involving the circulatory and respiratory systems: Secondary | ICD-10-CM | POA: Diagnosis not present

## 2018-12-31 DIAGNOSIS — E119 Type 2 diabetes mellitus without complications: Secondary | ICD-10-CM | POA: Diagnosis not present

## 2018-12-31 DIAGNOSIS — I96 Gangrene, not elsewhere classified: Secondary | ICD-10-CM

## 2018-12-31 DIAGNOSIS — Y835 Amputation of limb(s) as the cause of abnormal reaction of the patient, or of later complication, without mention of misadventure at the time of the procedure: Secondary | ICD-10-CM | POA: Diagnosis present

## 2018-12-31 DIAGNOSIS — Z79899 Other long term (current) drug therapy: Secondary | ICD-10-CM | POA: Diagnosis not present

## 2018-12-31 DIAGNOSIS — E1165 Type 2 diabetes mellitus with hyperglycemia: Secondary | ICD-10-CM | POA: Diagnosis not present

## 2018-12-31 DIAGNOSIS — Z8673 Personal history of transient ischemic attack (TIA), and cerebral infarction without residual deficits: Secondary | ICD-10-CM | POA: Diagnosis not present

## 2018-12-31 DIAGNOSIS — T8781 Dehiscence of amputation stump: Secondary | ICD-10-CM | POA: Diagnosis not present

## 2018-12-31 DIAGNOSIS — I25810 Atherosclerosis of coronary artery bypass graft(s) without angina pectoris: Secondary | ICD-10-CM | POA: Diagnosis not present

## 2018-12-31 DIAGNOSIS — Z89511 Acquired absence of right leg below knee: Secondary | ICD-10-CM

## 2018-12-31 DIAGNOSIS — N189 Chronic kidney disease, unspecified: Secondary | ICD-10-CM | POA: Diagnosis not present

## 2018-12-31 DIAGNOSIS — S88111A Complete traumatic amputation at level between knee and ankle, right lower leg, initial encounter: Secondary | ICD-10-CM | POA: Diagnosis not present

## 2018-12-31 DIAGNOSIS — I252 Old myocardial infarction: Secondary | ICD-10-CM | POA: Diagnosis not present

## 2018-12-31 DIAGNOSIS — Z951 Presence of aortocoronary bypass graft: Secondary | ICD-10-CM

## 2018-12-31 DIAGNOSIS — R7309 Other abnormal glucose: Secondary | ICD-10-CM | POA: Diagnosis not present

## 2018-12-31 DIAGNOSIS — R110 Nausea: Secondary | ICD-10-CM | POA: Diagnosis not present

## 2018-12-31 DIAGNOSIS — E1151 Type 2 diabetes mellitus with diabetic peripheral angiopathy without gangrene: Secondary | ICD-10-CM | POA: Diagnosis not present

## 2018-12-31 DIAGNOSIS — N183 Chronic kidney disease, stage 3 unspecified: Secondary | ICD-10-CM

## 2018-12-31 DIAGNOSIS — E162 Hypoglycemia, unspecified: Secondary | ICD-10-CM | POA: Diagnosis not present

## 2018-12-31 DIAGNOSIS — E1152 Type 2 diabetes mellitus with diabetic peripheral angiopathy with gangrene: Secondary | ICD-10-CM | POA: Diagnosis present

## 2018-12-31 DIAGNOSIS — G2581 Restless legs syndrome: Secondary | ICD-10-CM | POA: Diagnosis not present

## 2018-12-31 DIAGNOSIS — I2510 Atherosclerotic heart disease of native coronary artery without angina pectoris: Secondary | ICD-10-CM | POA: Diagnosis not present

## 2018-12-31 DIAGNOSIS — E1122 Type 2 diabetes mellitus with diabetic chronic kidney disease: Secondary | ICD-10-CM | POA: Diagnosis not present

## 2018-12-31 DIAGNOSIS — E1142 Type 2 diabetes mellitus with diabetic polyneuropathy: Secondary | ICD-10-CM

## 2018-12-31 DIAGNOSIS — G546 Phantom limb syndrome with pain: Secondary | ICD-10-CM | POA: Diagnosis not present

## 2018-12-31 DIAGNOSIS — I5032 Chronic diastolic (congestive) heart failure: Secondary | ICD-10-CM

## 2018-12-31 DIAGNOSIS — D649 Anemia, unspecified: Secondary | ICD-10-CM | POA: Diagnosis not present

## 2018-12-31 DIAGNOSIS — Z794 Long term (current) use of insulin: Secondary | ICD-10-CM

## 2018-12-31 DIAGNOSIS — E785 Hyperlipidemia, unspecified: Secondary | ICD-10-CM | POA: Diagnosis present

## 2018-12-31 DIAGNOSIS — I70261 Atherosclerosis of native arteries of extremities with gangrene, right leg: Secondary | ICD-10-CM | POA: Diagnosis not present

## 2018-12-31 DIAGNOSIS — Z885 Allergy status to narcotic agent status: Secondary | ICD-10-CM

## 2018-12-31 DIAGNOSIS — Z7951 Long term (current) use of inhaled steroids: Secondary | ICD-10-CM

## 2018-12-31 DIAGNOSIS — I739 Peripheral vascular disease, unspecified: Secondary | ICD-10-CM | POA: Diagnosis not present

## 2018-12-31 DIAGNOSIS — E039 Hypothyroidism, unspecified: Secondary | ICD-10-CM

## 2018-12-31 DIAGNOSIS — Z79890 Hormone replacement therapy (postmenopausal): Secondary | ICD-10-CM | POA: Diagnosis not present

## 2018-12-31 DIAGNOSIS — Z7982 Long term (current) use of aspirin: Secondary | ICD-10-CM | POA: Diagnosis not present

## 2018-12-31 DIAGNOSIS — Z4781 Encounter for orthopedic aftercare following surgical amputation: Secondary | ICD-10-CM | POA: Diagnosis not present

## 2018-12-31 DIAGNOSIS — I110 Hypertensive heart disease with heart failure: Secondary | ICD-10-CM | POA: Diagnosis not present

## 2018-12-31 DIAGNOSIS — K5900 Constipation, unspecified: Secondary | ICD-10-CM | POA: Diagnosis present

## 2018-12-31 DIAGNOSIS — E876 Hypokalemia: Secondary | ICD-10-CM | POA: Diagnosis not present

## 2018-12-31 DIAGNOSIS — D631 Anemia in chronic kidney disease: Secondary | ICD-10-CM | POA: Diagnosis present

## 2018-12-31 DIAGNOSIS — Z880 Allergy status to penicillin: Secondary | ICD-10-CM

## 2018-12-31 DIAGNOSIS — L97919 Non-pressure chronic ulcer of unspecified part of right lower leg with unspecified severity: Secondary | ICD-10-CM | POA: Diagnosis not present

## 2018-12-31 DIAGNOSIS — G4700 Insomnia, unspecified: Secondary | ICD-10-CM | POA: Diagnosis not present

## 2018-12-31 DIAGNOSIS — D62 Acute posthemorrhagic anemia: Secondary | ICD-10-CM | POA: Diagnosis not present

## 2018-12-31 DIAGNOSIS — Z881 Allergy status to other antibiotic agents status: Secondary | ICD-10-CM

## 2018-12-31 DIAGNOSIS — I5033 Acute on chronic diastolic (congestive) heart failure: Secondary | ICD-10-CM | POA: Diagnosis not present

## 2018-12-31 HISTORY — DX: Gangrene, not elsewhere classified: I96

## 2018-12-31 HISTORY — PX: AMPUTATION: SHX166

## 2018-12-31 LAB — BASIC METABOLIC PANEL
Anion gap: 15 (ref 5–15)
Anion gap: 16 — ABNORMAL HIGH (ref 5–15)
Anion gap: 14 (ref 5–15)
BUN: 17 mg/dL (ref 6–20)
BUN: 17 mg/dL (ref 6–20)
BUN: 16 mg/dL (ref 6–20)
CO2: 30 mmol/L (ref 22–32)
CO2: 31 mmol/L (ref 22–32)
CO2: 29 mmol/L (ref 22–32)
Calcium: 8.9 mg/dL (ref 8.9–10.3)
Calcium: 8.9 mg/dL (ref 8.9–10.3)
Calcium: 8.5 mg/dL — ABNORMAL LOW (ref 8.9–10.3)
Chloride: 93 mmol/L — ABNORMAL LOW (ref 98–111)
Chloride: 94 mmol/L — ABNORMAL LOW (ref 98–111)
Chloride: 94 mmol/L — ABNORMAL LOW (ref 98–111)
Creatinine, Ser: 1.48 mg/dL — ABNORMAL HIGH (ref 0.44–1.00)
Creatinine, Ser: 1.42 mg/dL — ABNORMAL HIGH (ref 0.44–1.00)
Creatinine, Ser: 1.32 mg/dL — ABNORMAL HIGH (ref 0.44–1.00)
GFR calc Af Amer: 46 mL/min — ABNORMAL LOW (ref >60)
GFR calc Af Amer: 48 mL/min — ABNORMAL LOW (ref >60)
GFR calc Af Amer: 53 mL/min — ABNORMAL LOW (ref >60)
GFR calc non Af Amer: 39 mL/min — ABNORMAL LOW (ref >60)
GFR calc non Af Amer: 41 mL/min — ABNORMAL LOW (ref >60)
GFR calc non Af Amer: 45 mL/min — ABNORMAL LOW (ref >60)
Glucose, Bld: 129 mg/dL — ABNORMAL HIGH (ref 70–99)
Glucose, Bld: 90 mg/dL (ref 70–99)
Glucose, Bld: 102 mg/dL — ABNORMAL HIGH (ref 70–99)
Potassium: 2.3 mmol/L — CL (ref 3.5–5.1)
Potassium: 2.6 mmol/L — CL (ref 3.5–5.1)
Potassium: 2.5 mmol/L — CL (ref 3.5–5.1)
Sodium: 138 mmol/L (ref 135–145)
Sodium: 141 mmol/L (ref 135–145)
Sodium: 137 mmol/L (ref 135–145)

## 2018-12-31 LAB — CBC
HCT: 34.2 % — ABNORMAL LOW (ref 36.0–46.0)
Hemoglobin: 10.9 g/dL — ABNORMAL LOW (ref 12.0–15.0)
MCH: 26.7 pg (ref 26.0–34.0)
MCHC: 31.9 g/dL (ref 30.0–36.0)
MCV: 83.8 fL (ref 80.0–100.0)
Platelets: 564 10*3/uL — ABNORMAL HIGH (ref 150–400)
RBC: 4.08 MIL/uL (ref 3.87–5.11)
RDW: 13.4 % (ref 11.5–15.5)
WBC: 14.1 10*3/uL — ABNORMAL HIGH (ref 4.0–10.5)
nRBC: 0 % (ref 0.0–0.2)

## 2018-12-31 LAB — GLUCOSE, CAPILLARY
Glucose-Capillary: 76 mg/dL (ref 70–99)
Glucose-Capillary: 130 mg/dL — ABNORMAL HIGH (ref 70–99)
Glucose-Capillary: 101 mg/dL — ABNORMAL HIGH (ref 70–99)
Glucose-Capillary: 94 mg/dL (ref 70–99)
Glucose-Capillary: 83 mg/dL (ref 70–99)

## 2018-12-31 SURGERY — AMPUTATION BELOW KNEE
Anesthesia: General | Laterality: Right

## 2018-12-31 MED ORDER — PROPOFOL 10 MG/ML IV BOLUS
INTRAVENOUS | Status: DC | PRN
  Administered 2018-12-31: 150 mg via INTRAVENOUS

## 2018-12-31 MED ORDER — SODIUM CHLORIDE 0.9 % IV SOLN
INTRAVENOUS | Status: DC
  Administered 2018-12-31: 15:00:00 via INTRAVENOUS

## 2018-12-31 MED ORDER — ONDANSETRON HCL 4 MG/2ML IJ SOLN
4.0000 mg | Freq: Four times a day (QID) | INTRAMUSCULAR | Status: DC | PRN
  Administered 2018-12-31 – 2019-01-03 (×6): 4 mg via INTRAVENOUS
  Filled 2018-12-31 (×7): qty 2

## 2018-12-31 MED ORDER — CEFAZOLIN SODIUM-DEXTROSE 1-4 GM/50ML-% IV SOLN
1.0000 g | Freq: Four times a day (QID) | INTRAVENOUS | Status: AC
  Administered 2018-12-31 – 2019-01-01 (×3): 1 g via INTRAVENOUS
  Filled 2018-12-31 (×3): qty 50

## 2018-12-31 MED ORDER — NALOXONE HCL 0.4 MG/ML IJ SOLN
INTRAMUSCULAR | Status: AC
  Filled 2018-12-31: qty 1

## 2018-12-31 MED ORDER — ALBUMIN HUMAN 5 % IV SOLN
INTRAVENOUS | Status: DC | PRN
  Administered 2018-12-31: 10:00:00 via INTRAVENOUS

## 2018-12-31 MED ORDER — FENTANYL CITRATE (PF) 250 MCG/5ML IJ SOLN
INTRAMUSCULAR | Status: AC
  Filled 2018-12-31: qty 5

## 2018-12-31 MED ORDER — LACTATED RINGERS IV SOLN
INTRAVENOUS | Status: DC
  Administered 2018-12-31: 09:00:00 via INTRAVENOUS

## 2018-12-31 MED ORDER — OXYCODONE HCL 5 MG PO TABS
10.0000 mg | ORAL_TABLET | ORAL | Status: DC | PRN
  Administered 2018-12-31 – 2019-01-01 (×3): 15 mg via ORAL
  Filled 2018-12-31 (×3): qty 3

## 2018-12-31 MED ORDER — HYDROMORPHONE HCL 1 MG/ML IJ SOLN
0.5000 mg | INTRAMUSCULAR | Status: DC | PRN
  Administered 2018-12-31 (×2): 1 mg via INTRAVENOUS
  Filled 2018-12-31 (×2): qty 1

## 2018-12-31 MED ORDER — FENTANYL CITRATE (PF) 250 MCG/5ML IJ SOLN
INTRAMUSCULAR | Status: DC | PRN
  Administered 2018-12-31: 50 ug via INTRAVENOUS
  Administered 2018-12-31: 100 ug via INTRAVENOUS

## 2018-12-31 MED ORDER — LIDOCAINE 2% (20 MG/ML) 5 ML SYRINGE
INTRAMUSCULAR | Status: DC | PRN
  Administered 2018-12-31: 80 mg via INTRAVENOUS

## 2018-12-31 MED ORDER — PROPOFOL 10 MG/ML IV BOLUS
INTRAVENOUS | Status: AC
  Filled 2018-12-31: qty 40

## 2018-12-31 MED ORDER — ONDANSETRON HCL 4 MG/2ML IJ SOLN
INTRAMUSCULAR | Status: AC
  Filled 2018-12-31: qty 2

## 2018-12-31 MED ORDER — POTASSIUM CHLORIDE 10 MEQ/100ML IV SOLN
INTRAVENOUS | Status: AC
  Filled 2018-12-31: qty 100

## 2018-12-31 MED ORDER — ASPIRIN EC 81 MG PO TBEC
81.0000 mg | DELAYED_RELEASE_TABLET | Freq: Every day | ORAL | Status: DC
  Administered 2019-01-02 – 2019-01-05 (×4): 81 mg via ORAL
  Filled 2018-12-31 (×5): qty 1

## 2018-12-31 MED ORDER — ONDANSETRON HCL 4 MG/2ML IJ SOLN
INTRAMUSCULAR | Status: DC | PRN
  Administered 2018-12-31: 4 mg via INTRAVENOUS

## 2018-12-31 MED ORDER — INSULIN ASPART 100 UNIT/ML ~~LOC~~ SOLN
0.0000 [IU] | Freq: Three times a day (TID) | SUBCUTANEOUS | Status: DC
  Administered 2019-01-02 – 2019-01-03 (×2): 2 [IU] via SUBCUTANEOUS
  Administered 2019-01-04: 3 [IU] via SUBCUTANEOUS
  Administered 2019-01-04: 5 [IU] via SUBCUTANEOUS

## 2018-12-31 MED ORDER — ONDANSETRON HCL 4 MG PO TABS: 4.0000 mg | ORAL_TABLET | Freq: Four times a day (QID) | ORAL | Status: DC | PRN

## 2018-12-31 MED ORDER — AMLODIPINE BESYLATE 5 MG PO TABS
5.0000 mg | ORAL_TABLET | Freq: Every day | ORAL | Status: DC
  Administered 2018-12-31 – 2019-01-05 (×5): 5 mg via ORAL
  Filled 2018-12-31 (×6): qty 1

## 2018-12-31 MED ORDER — METOCLOPRAMIDE HCL 5 MG PO TABS: 5.0000 mg | ORAL_TABLET | Freq: Three times a day (TID) | ORAL | Status: DC | PRN

## 2018-12-31 MED ORDER — POTASSIUM CHLORIDE 10 MEQ/100ML IV SOLN
10.0000 meq | INTRAVENOUS | Status: AC
  Filled 2018-12-31 (×2): qty 100

## 2018-12-31 MED ORDER — PHENYLEPHRINE 40 MCG/ML (10ML) SYRINGE FOR IV PUSH (FOR BLOOD PRESSURE SUPPORT)
PREFILLED_SYRINGE | INTRAVENOUS | Status: DC | PRN
  Administered 2018-12-31: 80 ug via INTRAVENOUS
  Administered 2018-12-31: 120 ug via INTRAVENOUS

## 2018-12-31 MED ORDER — CLINDAMYCIN PHOSPHATE 900 MG/50ML IV SOLN
900.0000 mg | INTRAVENOUS | Status: AC
  Administered 2018-12-31: 900 mg via INTRAVENOUS

## 2018-12-31 MED ORDER — LIDOCAINE 2% (20 MG/ML) 5 ML SYRINGE
INTRAMUSCULAR | Status: AC
  Filled 2018-12-31: qty 5

## 2018-12-31 MED ORDER — DOCUSATE SODIUM 100 MG PO CAPS
100.0000 mg | ORAL_CAPSULE | Freq: Two times a day (BID) | ORAL | Status: DC
  Administered 2018-12-31 – 2019-01-05 (×9): 100 mg via ORAL
  Filled 2018-12-31 (×12): qty 1

## 2018-12-31 MED ORDER — METOCLOPRAMIDE HCL 5 MG/ML IJ SOLN
5.0000 mg | Freq: Three times a day (TID) | INTRAMUSCULAR | Status: DC | PRN
  Administered 2019-01-01: 10 mg via INTRAVENOUS
  Filled 2018-12-31: qty 2

## 2018-12-31 MED ORDER — METHOCARBAMOL 500 MG PO TABS
500.0000 mg | ORAL_TABLET | Freq: Four times a day (QID) | ORAL | Status: DC | PRN
  Administered 2018-12-31 – 2019-01-02 (×3): 500 mg via ORAL
  Filled 2018-12-31 (×2): qty 1

## 2018-12-31 MED ORDER — 0.9 % SODIUM CHLORIDE (POUR BTL) OPTIME
TOPICAL | Status: DC | PRN
  Administered 2018-12-31: 1000 mL

## 2018-12-31 MED ORDER — ACETAMINOPHEN 325 MG PO TABS
325.0000 mg | ORAL_TABLET | Freq: Four times a day (QID) | ORAL | Status: DC | PRN
  Administered 2019-01-01 – 2019-01-02 (×2): 650 mg via ORAL
  Filled 2018-12-31 (×2): qty 2

## 2018-12-31 MED ORDER — OXYCODONE HCL 5 MG PO TABS
ORAL_TABLET | ORAL | Status: AC
  Filled 2018-12-31: qty 2

## 2018-12-31 MED ORDER — MIDAZOLAM HCL 2 MG/2ML IJ SOLN
INTRAMUSCULAR | Status: DC | PRN
  Administered 2018-12-31: 2 mg via INTRAVENOUS

## 2018-12-31 MED ORDER — SUCCINYLCHOLINE CHLORIDE 200 MG/10ML IV SOSY
PREFILLED_SYRINGE | INTRAVENOUS | Status: AC
  Filled 2018-12-31: qty 10

## 2018-12-31 MED ORDER — LEVOTHYROXINE SODIUM 25 MCG PO TABS
25.0000 ug | ORAL_TABLET | Freq: Every day | ORAL | Status: DC
  Administered 2019-01-02 – 2019-01-05 (×4): 25 ug via ORAL
  Filled 2018-12-31 (×5): qty 1

## 2018-12-31 MED ORDER — MIDAZOLAM HCL 2 MG/2ML IJ SOLN
INTRAMUSCULAR | Status: AC
  Filled 2018-12-31: qty 2

## 2018-12-31 MED ORDER — GABAPENTIN 300 MG PO CAPS
300.0000 mg | ORAL_CAPSULE | Freq: Three times a day (TID) | ORAL | Status: DC
  Administered 2019-01-01 – 2019-01-04 (×10): 300 mg via ORAL
  Filled 2018-12-31 (×12): qty 1

## 2018-12-31 MED ORDER — METOPROLOL TARTRATE 25 MG PO TABS
25.0000 mg | ORAL_TABLET | Freq: Two times a day (BID) | ORAL | Status: DC
  Administered 2018-12-31 – 2019-01-05 (×10): 25 mg via ORAL
  Filled 2018-12-31 (×11): qty 1

## 2018-12-31 MED ORDER — INSULIN GLARGINE 100 UNIT/ML ~~LOC~~ SOLN
40.0000 [IU] | Freq: Every day | SUBCUTANEOUS | Status: DC
  Administered 2019-01-01 – 2019-01-04 (×4): 40 [IU] via SUBCUTANEOUS
  Filled 2018-12-31 (×6): qty 0.4

## 2018-12-31 MED ORDER — CLINDAMYCIN PHOSPHATE 900 MG/50ML IV SOLN
INTRAVENOUS | Status: AC
  Filled 2018-12-31: qty 50

## 2018-12-31 MED ORDER — POTASSIUM CHLORIDE 10 MEQ/100ML IV SOLN
10.0000 meq | INTRAVENOUS | Status: AC
  Administered 2018-12-31 (×3): 10 meq via INTRAVENOUS
  Filled 2018-12-31 (×2): qty 100

## 2018-12-31 MED ORDER — DEXTROSE 50 % IV SOLN
25.0000 mL | Freq: Once | INTRAVENOUS | Status: AC
  Administered 2018-12-31: 25 mL via INTRAVENOUS
  Filled 2018-12-31: qty 50

## 2018-12-31 MED ORDER — MAGNESIUM CITRATE PO SOLN: 1.0000 | Freq: Once | ORAL | Status: DC | PRN

## 2018-12-31 MED ORDER — ESCITALOPRAM OXALATE 10 MG PO TABS
10.0000 mg | ORAL_TABLET | Freq: Every day | ORAL | Status: DC
  Administered 2018-12-31 – 2019-01-05 (×5): 10 mg via ORAL
  Filled 2018-12-31 (×6): qty 1

## 2018-12-31 MED ORDER — OXYCODONE HCL 5 MG PO TABS
5.0000 mg | ORAL_TABLET | ORAL | Status: DC | PRN
  Administered 2018-12-31: 10 mg via ORAL

## 2018-12-31 MED ORDER — METHOCARBAMOL 500 MG PO TABS
ORAL_TABLET | ORAL | Status: AC
  Filled 2018-12-31: qty 1

## 2018-12-31 MED ORDER — ATORVASTATIN CALCIUM 80 MG PO TABS
80.0000 mg | ORAL_TABLET | Freq: Every day | ORAL | Status: DC
  Administered 2019-01-01 – 2019-01-04 (×4): 80 mg via ORAL
  Filled 2018-12-31 (×5): qty 1

## 2018-12-31 MED ORDER — INSULIN ASPART 100 UNIT/ML ~~LOC~~ SOLN
4.0000 [IU] | Freq: Three times a day (TID) | SUBCUTANEOUS | Status: DC
  Administered 2019-01-02 – 2019-01-04 (×2): 4 [IU] via SUBCUTANEOUS

## 2018-12-31 MED ORDER — FENTANYL CITRATE (PF) 100 MCG/2ML IJ SOLN
25.0000 ug | INTRAMUSCULAR | Status: DC | PRN
  Administered 2018-12-31: 50 ug via INTRAVENOUS

## 2018-12-31 MED ORDER — BISACODYL 10 MG RE SUPP: 10.0000 mg | Freq: Every day | RECTAL | Status: DC | PRN

## 2018-12-31 MED ORDER — METHOCARBAMOL 1000 MG/10ML IJ SOLN
500.0000 mg | Freq: Four times a day (QID) | INTRAVENOUS | Status: DC | PRN
  Filled 2018-12-31 (×2): qty 5

## 2018-12-31 MED ORDER — SUCCINYLCHOLINE CHLORIDE 200 MG/10ML IV SOSY
PREFILLED_SYRINGE | INTRAVENOUS | Status: DC | PRN
  Administered 2018-12-31: 140 mg via INTRAVENOUS

## 2018-12-31 MED ORDER — POTASSIUM CHLORIDE 10 MEQ/100ML IV SOLN
10.0000 meq | INTRAVENOUS | Status: AC
  Administered 2018-12-31 (×2): 10 meq via INTRAVENOUS

## 2018-12-31 MED ORDER — POLYETHYLENE GLYCOL 3350 17 G PO PACK: 17.0000 g | PACK | Freq: Every day | ORAL | Status: DC | PRN

## 2018-12-31 MED ORDER — ISOSORBIDE MONONITRATE ER 30 MG PO TB24
15.0000 mg | ORAL_TABLET | Freq: Every day | ORAL | Status: DC
  Administered 2018-12-31 – 2019-01-05 (×5): 15 mg via ORAL
  Filled 2018-12-31 (×6): qty 1

## 2018-12-31 MED ORDER — CHLORHEXIDINE GLUCONATE 4 % EX LIQD: 60.0000 mL | Freq: Once | CUTANEOUS | Status: DC

## 2018-12-31 MED ORDER — FENTANYL CITRATE (PF) 100 MCG/2ML IJ SOLN
INTRAMUSCULAR | Status: AC
  Filled 2018-12-31: qty 2

## 2018-12-31 MED ORDER — DEXTROSE 50 % IV SOLN
INTRAVENOUS | Status: AC
  Administered 2018-12-31: 25 mL via INTRAVENOUS
  Filled 2018-12-31: qty 50

## 2018-12-31 SURGICAL SUPPLY — 39 items
BLADE SAW RECIP 87.9 MT (BLADE) ×2 IMPLANT
BLADE SURG 21 STRL SS (BLADE) ×2 IMPLANT
BNDG COHESIVE 2X5 TAN STRL LF (GAUZE/BANDAGES/DRESSINGS) ×1 IMPLANT
BNDG COHESIVE 6X5 TAN STRL LF (GAUZE/BANDAGES/DRESSINGS) ×1 IMPLANT
CANISTER WOUND CARE 500ML ATS (WOUND CARE) ×2 IMPLANT
CANISTER WOUNDNEG PRESSURE 500 (CANNISTER) ×1 IMPLANT
COVER SURGICAL LIGHT HANDLE (MISCELLANEOUS) ×1 IMPLANT
COVER WAND RF STERILE (DRAPES) IMPLANT
CUFF TOURNIQUET SINGLE 34IN LL (TOURNIQUET CUFF) ×2 IMPLANT
DRAPE INCISE IOBAN 66X45 STRL (DRAPES) ×2 IMPLANT
DRAPE U-SHAPE 47X51 STRL (DRAPES) ×2 IMPLANT
DRESSING PREVENA PLUS CUSTOM (GAUZE/BANDAGES/DRESSINGS) ×1 IMPLANT
DRSG PREVENA PLUS CUSTOM (GAUZE/BANDAGES/DRESSINGS) ×2
DURAPREP 26ML APPLICATOR (WOUND CARE) ×2 IMPLANT
ELECT REM PT RETURN 9FT ADLT (ELECTROSURGICAL) ×2
ELECTRODE REM PT RTRN 9FT ADLT (ELECTROSURGICAL) ×1 IMPLANT
GLOVE BIOGEL PI IND STRL 9 (GLOVE) ×1 IMPLANT
GLOVE BIOGEL PI INDICATOR 9 (GLOVE) ×1
GLOVE SURG ORTHO 9.0 STRL STRW (GLOVE) ×2 IMPLANT
GOWN STRL REUS W/ TWL XL LVL3 (GOWN DISPOSABLE) ×2 IMPLANT
GOWN STRL REUS W/TWL XL LVL3 (GOWN DISPOSABLE) ×2
KIT BASIN OR (CUSTOM PROCEDURE TRAY) ×2 IMPLANT
KIT TURNOVER KIT B (KITS) ×2 IMPLANT
MANIFOLD NEPTUNE II (INSTRUMENTS) ×1 IMPLANT
NS IRRIG 1000ML POUR BTL (IV SOLUTION) ×2 IMPLANT
PACK ORTHO EXTREMITY (CUSTOM PROCEDURE TRAY) ×2 IMPLANT
PAD ARMBOARD 7.5X6 YLW CONV (MISCELLANEOUS) ×2 IMPLANT
PREVENA RESTOR ARTHOFORM 33X30 (CANNISTER) ×1 IMPLANT
PREVENA RESTOR ARTHOFORM 46X30 (CANNISTER) ×2 IMPLANT
SPONGE LAP 18X18 RF (DISPOSABLE) IMPLANT
STAPLER VISISTAT 35W (STAPLE) IMPLANT
STOCKINETTE IMPERVIOUS LG (DRAPES) ×2 IMPLANT
SUT ETHILON 2 0 PSLX (SUTURE) ×3 IMPLANT
SUT SILK 2 0 (SUTURE) ×1
SUT SILK 2-0 18XBRD TIE 12 (SUTURE) ×1 IMPLANT
SUT VIC AB 1 CTX 27 (SUTURE) ×5 IMPLANT
TOWEL OR 17X26 10 PK STRL BLUE (TOWEL DISPOSABLE) ×2 IMPLANT
TUBE CONNECTING 12X1/4 (SUCTIONS) ×2 IMPLANT
YANKAUER SUCT BULB TIP NO VENT (SUCTIONS) ×2 IMPLANT

## 2018-12-31 NOTE — Progress Notes (Signed)
Physical medicine rehabilitation consult requested patient status post right transtibial amputation today 12/31/2018. Will await physical and occupational therapy evaluations and follow-up with appropriate recommendations

## 2018-12-31 NOTE — Progress Notes (Signed)
After iv fentanyl, desat'd into 50's/ npa placed and bvm assisted/ drs green and joslin at bedside immediately and afetr about 4-5 mins of bvm assit, pt spontaneously breathing and verbally responding, following commands/ did not brady

## 2018-12-31 NOTE — Care Management (Addendum)
Consult for home health and DME, await PT eval. Last admission declined home health services.  Magdalen Spatz RN BSN [phone]

## 2018-12-31 NOTE — Transfer of Care (Signed)
Immediate Anesthesia Transfer of Care Note  Patient: [name]  Procedure(s) Performed: RIGHT BELOW KNEE AMPUTATION (Right )  Patient Location: PACU  Anesthesia Type:General  Level of Consciousness: awake, alert  and oriented  Airway & Oxygen Therapy: Patient Spontanous Breathing and Patient connected to face mask oxygen  Post-op Assessment: Report given to RN and Post -op Vital signs reviewed and stable  Post vital signs: Reviewed and stable  Last Vitals:  Vitals Value Taken Time  BP 160/96 12/31/2018 10:53 AM  Temp    Pulse 83 12/31/2018 10:57 AM  Resp 16 12/31/2018 10:57 AM  SpO2 100 % 12/31/2018 10:57 AM  Vitals shown include unvalidated device data.  Last Pain:  Vitals:   12/31/18 0805  TempSrc: Oral  PainSc: 5       Patients Stated Pain Goal: 2 (30/16/01 0932)  Complications: No apparent anesthesia complications

## 2018-12-31 NOTE — Progress Notes (Signed)
Notified by lab that BMET was grossly hemolyzed. Will put in order for a re-draw.   Jacqlyn Larsen, RN

## 2018-12-31 NOTE — Plan of Care (Signed)

## 2018-12-31 NOTE — Social Work (Signed)
CSW acknowledging consult for SNF placement. Will follow for therapy recommendations.   Isabel Chasse, MSW, LCSWA [place] Clinical Social Work [phone]   

## 2018-12-31 NOTE — Anesthesia Procedure Notes (Signed)
Procedure Name: Intubation Date/Time: 12/31/2018 9:55 AM Performed by: Alain Marion, CRNA Pre-anesthesia Checklist: Patient identified, Emergency Drugs available, Suction available and Patient being monitored Patient Re-evaluated:Patient Re-evaluated prior to induction Oxygen Delivery Method: Circle System Utilized Preoxygenation: Pre-oxygenation with 100% oxygen Induction Type: IV induction and Rapid sequence Laryngoscope Size: Miller and 2 Grade View: Grade I Tube type: Oral Tube size: 7.0 mm Number of attempts: 1 Airway Equipment and Method: Stylet and Oral airway Placement Confirmation: ETT inserted through vocal cords under direct vision,  positive ETCO2 and breath sounds checked- equal and bilateral Secured at: 21 cm Tube secured with: Tape Dental Injury: Teeth and Oropharynx as per pre-operative assessment

## 2018-12-31 NOTE — Progress Notes (Signed)
Verbal order from [name] Given, MD to give pt. 3 runs of K+ via IV due to pt having critical K+ value of 2.5

## 2018-12-31 NOTE — Progress Notes (Signed)
Orthopedic Tech Progress Note Patient Details:  [name] 12/25/1965 [id number] Called and placed order with [facility]. [name], female   DOB: Apr 20, 1966, 53 y.o.   MRN: [id number]   [name] 12/31/2018, 4:26 PM

## 2018-12-31 NOTE — Anesthesia Postprocedure Evaluation (Signed)
Anesthesia Post Note  Patient: [name]  Procedure(s) Performed: RIGHT BELOW KNEE AMPUTATION (Right )     Patient location during evaluation: PACU Anesthesia Type: General Level of consciousness: awake Pain management: pain level controlled Vital Signs Assessment: post-procedure vital signs reviewed and stable Respiratory status: spontaneous breathing Cardiovascular status: stable Postop Assessment: no apparent nausea or vomiting Anesthetic complications: no    Last Vitals:  Vitals:   12/31/18 1345 12/31/18 1417  BP: (!) 148/99 (!) 177/96  Pulse: 64 64  Resp: 12 18  Temp: (!) 36.3 C 36.5 C  SpO2: 100% 100%    Last Pain:  Vitals:   12/31/18 1417  TempSrc: Oral  PainSc:                  CHARLENE GREEN

## 2018-12-31 NOTE — H&P (Signed)
[name] is an 53 y.o. female.   Chief Complaint: Gangrene of right foot HPI: The patient is a 53 year old woman who is 2 weeks status post a right first ray amputation.  She presents with increased gangrenous changes and wound dehiscence as well as painful cellulitis of her right foot.  She is developed some low-grade fever and is generally not feeling well.  She is admitted for right below the knee amputation today.  Past Medical History:  Diagnosis Date  . Acute on chronic diastolic CHF (congestive heart failure) ([place]) 06/24/2018  . Anxiety   . COMMON MIGRAINE 10/07/2010  . Decreased visual acuity 11/10/2016  . Depression 12/18/2012  . Diabetes mellitus type II, uncontrolled ([place]) 10/07/2010   Qualifier: Diagnosis of  By: Charlett Blake MD, [name]    . Diabetic foot infection ([place]) 08/26/2016  . Disturbance of skin sensation 10/07/2010  . Gangrene of right foot ([place])   . Heart murmur   . History of kidney stones    "years ago"  . Hyperlipidemia 12/06/2010  . Hypertension   . Lipoma of abdominal wall 10/05/2016  . Overweight(278.02) 12/06/2010  . PERIPHERAL NEUROPATHY, FEET 10/07/2010  . PVD (peripheral vascular disease) ([place]) 01/21/2012  . RESTLESS LEG SYNDROME 10/25/2010  . Stroke [facility]) 2014, 2017   most recently in 2/17 - intracerebral hemorrhage    Past Surgical History:  Procedure Laterality Date  . AMPUTATION Right 12/13/2018   Procedure: AMPUTATION RIGHT GREAT TOE, LOCAL RELOCATION OF TISSUE FOR WOUND CLOSURE 9cm x 3cm, VAC APPLICATION;  Surgeon: [name], MD;  Location: [place];  Service: Orthopedics;  Laterality: Right;  . AMPUTATION TOE Left 09/09/2016   Procedure: AMPUTATION OF LEFT GREAT TOE;  Surgeon: Milly Jakob, MD;  Location: [place];  Service: Orthopedics;  Laterality: Left;  . CARDIAC CATHETERIZATION  06/24/2018  . CORONARY ARTERY BYPASS GRAFT N/A 06/28/2018   Procedure: CORONARY ARTERY BYPASS GRAFTING (CABG) times  four, using left internal mammary  artery, endoscopically harvested right saphenous vein, and harvested left radial artery;  Surgeon: [name], MD;  Location: [place];  Service: Open Heart Surgery;  Laterality: N/A;  . ENDOVEIN HARVEST OF GREATER SAPHENOUS VEIN Right 06/28/2018   Procedure: ENDOVEIN HARVEST OF GREATER SAPHENOUS VEIN;  Surgeon: [name], MD;  Location: [place];  Service: Open Heart Surgery;  Laterality: Right;  . LEFT HEART CATH AND CORONARY ANGIOGRAPHY N/A 06/24/2018   Procedure: LEFT HEART CATH AND CORONARY ANGIOGRAPHY;  Surgeon: Troy Sine, MD;  Location: [place] CV LAB;  Service: Cardiovascular;  Laterality: N/A;  . RADIAL ARTERY HARVEST Left 06/28/2018   Procedure: RADIAL ARTERY HARVEST;  Surgeon: [name], MD;  Location: [place];  Service: Open Heart Surgery;  Laterality: Left;  . TEE WITHOUT CARDIOVERSION N/A 06/28/2018   Procedure: TRANSESOPHAGEAL ECHOCARDIOGRAM (TEE);  Surgeon: [name], MD;  Location: [place];  Service: Open Heart Surgery;  Laterality: N/A;  . WISDOM TOOTH EXTRACTION      Family History  Problem Relation Age of Onset  . Arthritis Mother   . Stroke Brother        previous smoker  . Alcohol abuse Brother        in remission  . Leukemia Brother   . Diabetes Paternal Grandmother   . Healthy Son    Social History:  reports that she quit smoking about 13 years ago. She smoked 0.50 packs per day. She has never used smokeless tobacco. She reports previous drug  use. She reports that she does not drink alcohol.  Allergies:  Allergies  Allergen Reactions  . Penicillins Other (See Comments)    made pt feel sick  Did it involve swelling of the face/tongue/throat, SOB, or low BP? No Did it involve sudden or severe rash/hives, skin peeling, or any reaction on the inside of your mouth or nose? No Did you need to seek medical attention at a hospital or doctor's office? No When did it last happen?April 2020 If all above answers are  "NO", may proceed with cephalosporin use.   Recardo Evangelist [Pregabalin] Other (See Comments)    MADE PATIENT VERY EMOTIONAL AND WOULD CRY EASILY  . Morphine And Related Nausea And Vomiting  . Tramadol Nausea And Vomiting    Pt cant tolerate this pain med.     No medications prior to admission.    No results found for this or any previous visit (from the past 48 hour(s)). No results found.  Review of Systems  All other systems reviewed and are negative.   Height 5\' 3"  (1.6 m), weight 59 kg, last menstrual period 02/12/2011. Physical Exam  Constitutional: She is oriented to person, place, and time. She appears well-developed and well-nourished.  HENT:  Head: Normocephalic and atraumatic.  Neck: No tracheal deviation present. No thyromegaly present.  Cardiovascular: Normal rate.  Respiratory: Effort normal. No stridor. No respiratory distress.  GI: Soft. She exhibits no distension.  Musculoskeletal:     Comments: Right foot - She has progressive gangrenous full-thickness dehiscence of the surgical incision.  There is exposed bone there is cellulitis that is tender to palpation and there is clear drainage no purulence.  There is no ascending cellulitis though her calf is tender to palpation there is no crepitation with palpation of the calf.   Neurological: She is alert and oriented to person, place, and time. No cranial nerve deficit. Coordination normal.  Skin: Skin is warm.  Psychiatric: She has a normal mood and affect. Her behavior is normal. Judgment and thought content normal.     Assessment/Plan Worsening gangrene right foot-plan right below the knee amputation.  The procedure and possible benefits and risks were discussed at length with the patient including risk of bleeding, infection, neurovascular injury, possible need for further surgery and the patient's questions were answered to her satisfaction.  The patient desires to proceed with surgery at this time.  Erlinda Hong,  PA-C 12/31/2018, 7:29 AM [facility] [phone]

## 2018-12-31 NOTE — Progress Notes (Signed)
HR= 105. CBG= 76. Dr. Nyoka Cowden notified. Verbal order to administer 1/2 amp D50. Will continue to monitor.

## 2018-12-31 NOTE — Op Note (Signed)
   Date of Surgery: 12/31/2018  INDICATIONS: [name] is a 53 y.o.-year-old female who has gangrene right foot with good DP pulse.  PREOPERATIVE DIAGNOSIS: gangrene right foot  POSTOPERATIVE DIAGNOSIS: Same.  PROCEDURE: Transtibial amputation Application of Prevena wound VAC  SURGEON: [name] Given, M.D.  ANESTHESIA:  general  IV FLUIDS AND URINE: See anesthesia.  ESTIMATED BLOOD LOSS: 100 mL.  COMPLICATIONS: None.  DESCRIPTION OF PROCEDURE: The patient was brought to the operating room and underwent a general anesthetic. After adequate levels of anesthesia were obtained patient's lower extremity was prepped using DuraPrep draped into a sterile field. A timeout was called. The foot was draped out of the sterile field with impervious stockinette. A transverse incision was made 11 cm distal to the tibial tubercle. This curved proximally and a large posterior flap was created. The tibia was transected 1 cm proximal to the skin incision. The fibula was transected just proximal to the tibial incision. The tibia was beveled anteriorly. A large posterior flap was created. The sciatic nerve was pulled cut and allowed to retract. The vascular bundles were suture ligated with 2-0 silk. The deep and superficial fascial layers were closed using #1 Vicryl. The skin was closed using staples and 2-0 nylon. The wound was covered with a Prevena custom and restor wound VAC. There was a good suction fit. A prosthetic shrinker will be applied. Patient was extubated taken to the PACU in stable condition.   DISCHARGE PLANNING:  Antibiotic duration:24 hours  Weightbearing: NWB right  Pain medication: opoid pathway  Dressing care/ Wound VAC:vac for 1 week  Discharge to: SNF  Follow-up: In the office 1 week post operative.  Tiffany Score, MD [place] 10:31 AM

## 2018-12-31 NOTE — Progress Notes (Signed)
CRITICAL VALUE ALERT  Critical Value:  2.5  Date & Time Notied: 12/31/2018  Provider Notified: [id number]  Orders Received/Actions taken: On call MD notified via [place] answering service

## 2019-01-01 ENCOUNTER — Encounter (HOSPITAL_COMMUNITY): Payer: Self-pay | Admitting: Orthopedic Surgery

## 2019-01-01 LAB — GLUCOSE, CAPILLARY
Glucose-Capillary: 84 mg/dL (ref 70–99)
Glucose-Capillary: 170 mg/dL — ABNORMAL HIGH (ref 70–99)
Glucose-Capillary: 150 mg/dL — ABNORMAL HIGH (ref 70–99)
Glucose-Capillary: 142 mg/dL — ABNORMAL HIGH (ref 70–99)

## 2019-01-01 MED ORDER — NALOXONE HCL 0.4 MG/ML IJ SOLN
INTRAMUSCULAR | Status: AC
  Administered 2019-01-01: 13:00:00
  Filled 2019-01-01: qty 1

## 2019-01-01 NOTE — Progress Notes (Signed)
[name], female   DOB: March 31, 1966, 53 y.o.   MRN: [id number] Patient is postoperative day 1 right transtibial amputation.  She is working with Museum/gallery curator for prosthetic fitting there is no drainage in the wound VAC canister.  2 checks on the seal check.  Plan for discharge either to inpatient or outpatient rehabilitation.

## 2019-01-01 NOTE — Progress Notes (Signed)
Made aware of family's concerns after a phone call to the [facility]. Spoke with patient's son [name]. Son was aware of patient's status change and requiring a dose of Narcan today. Also aware of patient requiring Narcan on yesterday.  Son verbalizes his frustrations that 1)  The patient was the first to call today and inform him she    "overdose". 2) This was the second, possible  third occurrence this hospitalization; and he was not informed before today.  Service recovery and emotional support provided. Informed son of patient's current status and that new orders for pain med obtained.   Son requests staff to call at least once a shift with an update, even if there are no changes. Staff aware. Phone number provided to son to call for updates as well.

## 2019-01-01 NOTE — Progress Notes (Signed)
Patient given 15 mg of oxycodone at 1039 for 8/10 pain.  At 1235 patient was found less responsive with a low oxygen level.  Pt's vitals taken. Pt's vitals were as follows: T 98, P 107, RR 16, 100% on 2L.  Pt's blood sugar was 170.  Pt given 0.4 mg of Narcan at 1242.  A few minutes later the patient became responsive. Pt stated she was nauseated.  Pt given 10 mg of Reglan at 1238.  Pt drifted off back to sleep.  Pt was reassessed 10 mins later.  Pt easily arousable and responds to questions appropriately.  Pt denies chest pain, shortness of breath, dizziness, and lightheadedness.  Pt's son called and notified of situation.    63- Dr. [name] Given was notified of episode.  Dr. [name] Given discontinued all narcotic medications.  Pt notified.  Pt verbalized understanding.

## 2019-01-01 NOTE — Evaluation (Signed)
Physical Therapy Evaluation Patient Details Name: [name] MRN: [id number] DOB: [birth year] Today's Date: 01/01/2019   History of Present Illness  Pt is a 53 y.o. patient who is s/p R BKA. PMH signficant for CHF, anxiety, migraine, depression, DM, PVD, stroke.   Clinical Impression  Pt presented supine in bed with HOB elevated, awake and willing to participate in therapy session. Prior to admission, pt reported that she was independent ADLs and ambulated with use of a RW. Pt lives in a single level home with a ramped entrance. She stated that she will have 24/7 supervision/assistance upon d/c. Pt very limited this session secondary to nausea. Pt able to sit EOB for ~10 minutes with min guard for safety. She required mod A to achieve sitting position and min guard to return to supine. Pt would continue to benefit from skilled physical therapy services at this time while admitted and after d/c to address the below listed limitations in order to improve overall safety and independence with functional mobility.     Follow Up Recommendations CIR    Equipment Recommendations  Other (comment)(defer to next venue)    Recommendations for Other Services Rehab consult     Precautions / Restrictions Precautions Precautions: Fall Precaution Comments: wound VAC Restrictions Weight Bearing Restrictions: Yes RLE Weight Bearing: Non weight bearing      Mobility  Bed Mobility Overal bed mobility: Needs Assistance Bed Mobility: Supine to Sit;Sit to Supine     Supine to sit: Mod assist;HOB elevated Sit to supine: Min guard   General bed mobility comments: Assist to elevate trunk OOB and to scoot hips toward EOB.   Transfers                 General transfer comment: pt refusing OOB activity at this time due to nausea  Ambulation/Gait                Stairs            Wheelchair Mobility    Modified Rankin (Stroke Patients Only)       Balance Overall balance  assessment: Needs assistance Sitting-balance support: Feet supported;No upper extremity supported Sitting balance-Leahy Scale: Good                                       Pertinent Vitals/Pain Pain Assessment: Faces Faces Pain Scale: Hurts little more Pain Location: right residual LE Pain Descriptors / Indicators: Operative site guarding;Grimacing Pain Intervention(s): Monitored during session;Repositioned    Home Living Family/patient expects to be discharged to:: Private residence Living Arrangements: Children Available Help at Discharge: Family;Friend(s);Available 24 hours/day   Home Access: Ramped entrance     Home Layout: One level Home Equipment: Cane - single point;Walker - 2 wheels      Prior Function Level of Independence: Independent with assistive device(s)         Comments: Using RW over past few weeks     Hand Dominance   Dominant Hand: Right    Extremity/Trunk Assessment   Upper Extremity Assessment Upper Extremity Assessment: Overall WFL for tasks assessed;Defer to OT evaluation    Lower Extremity Assessment Lower Extremity Assessment: RLE deficits/detail RLE Deficits / Details: wound VAC, protective shrinker in place as well RLE: Unable to fully assess due to pain       Communication   Communication: No difficulties  Cognition Arousal/Alertness: Awake/alert Behavior During Therapy: [facility] for  tasks assessed/performed Overall Cognitive Status: Within Functional Limits for tasks assessed                                        General Comments      Exercises     Assessment/Plan    PT Assessment Patient needs continued PT services  PT Problem List Decreased strength;Decreased activity tolerance;Decreased balance;Decreased mobility;Decreased coordination;Decreased knowledge of use of DME;Decreased safety awareness;Decreased knowledge of precautions;Pain       PT Treatment Interventions DME instruction;Gait  training;Stair training;Functional mobility training;Therapeutic activities;Therapeutic exercise;Balance training;Neuromuscular re-education;Patient/family education    PT Goals (Current goals can be found in the Care Plan section)  Acute Rehab PT Goals Patient Stated Goal: to feel better and to not have any further surgeries PT Goal Formulation: With patient Time For Goal Achievement: 01/15/19 Potential to Achieve Goals: Good    Frequency Min 5X/week   Barriers to discharge        Co-evaluation PT/OT/SLP Co-Evaluation/Treatment: Yes Reason for Co-Treatment: To address functional/ADL transfers;For patient/therapist safety;Other (comment)(unable to tolerate two separate sessions due to nausea) PT goals addressed during session: Mobility/safety with mobility;Balance;Strengthening/ROM         AM-PAC PT "6 Clicks" Mobility  Outcome Measure Help needed turning from your back to your side while in a flat bed without using bedrails?: A Lot Help needed moving from lying on your back to sitting on the side of a flat bed without using bedrails?: A Lot Help needed moving to and from a bed to a chair (including a wheelchair)?: A Lot Help needed standing up from a chair using your arms (e.g., wheelchair or bedside chair)?: A Lot Help needed to walk in hospital room?: Total Help needed climbing 3-5 steps with a railing? : Total 6 Click Score: 10    End of Session   Activity Tolerance: Patient limited by pain;Other (comment)(limited secondary to nausea) Patient left: in bed;with call bell/phone within reach;with bed alarm set Nurse Communication: Mobility status PT Visit Diagnosis: Difficulty in walking, not elsewhere classified (R26.2);Unsteadiness on feet (R26.81)    Time: 0263-7858 PT Time Calculation (min) (ACUTE ONLY): 20 min   Charges:   PT Evaluation $PT Eval Moderate Complexity: 1 Mod          Sherie Don, PT, DPT  Acute Rehabilitation Services Pager  [phone] Office [place] 01/01/2019, 2:00 PM

## 2019-01-01 NOTE — Significant Event (Signed)
Rapid Response Event Note  Overview: Time Called: 1218 Arrival Time: 1222 Event Type: Respiratory  Initial Focused Assessment: Per RN while she was working with PT/OT complaint of pain.  He gave her 15mg  Oxycodone at 1039.   About noon she had an episode of decrease responsiveness and desat.   Narcan given IV Upon my arrival she is awake and alert moaning with pain and complains of nausea. 182/90  HR 107  RR 16  O2 sat 100% on 2L [name] Reglan given (not time for Zofran given at 1030. After a few min patient more relaxed, easy to arouse.    Interventions:  Plan of Care (if not transferred): RN to call if assistance needed  Event Summary: Name of Physician Notified: [name] Given at      at    Outcome: Stayed in room and stabalized  Event End Time: [name]  [name]

## 2019-01-01 NOTE — Progress Notes (Signed)
Occupational Therapy Evaluation Patient Details Name: [name] MRN: [id number] DOB: 15-Feb-1966 Today's Date: 01/01/2019    History of Present Illness 53 y.o. patient who is s/p R BKA. PMH signficant for CHF, anxiety, migraine, depression, DM, PVD, stroke.    Clinical Impression   Pt s/p right BKA. She presents with decreased ADL independence due to right LE pain and amputation.  Pt also limited during evaluation due to nausea. PTA, she is independent with ADLs and reports that she has been using RW the last few weeks. She reports her son and his girlfriend can provide assist at home.  Mod-max assist for LB ADLs.  Functional transfer not attempted today due to pain and nausea. Performs bed mobility with mod assist for supine to sit and min guard to return to supine. Recommending CIR to further progress rehab prior to return home. Will continue to follow acutely in order to maximize safety and independence with ADLs.    Follow Up Recommendations  CIR;Supervision/Assistance - 24 hour    Equipment Recommendations  Other (comment)(defer to next venue)    Recommendations for Other Services Rehab consult     Precautions / Restrictions Precautions Precautions: Fall Restrictions Weight Bearing Restrictions: Yes RLE Weight Bearing: Non weight bearing      Mobility Bed Mobility Overal bed mobility: Needs Assistance Bed Mobility: Supine to Sit;Sit to Supine     Supine to sit: Mod assist;HOB elevated Sit to supine: Min guard   General bed mobility comments: Assist to elevate trunk OOB and to scoot hips toward EOB.   Transfers Overall transfer level: (pt declining OOB transfer)                    Balance                                           ADL either performed or assessed with clinical judgement   ADL Overall ADL's : Needs assistance/impaired Eating/Feeding: Independent;Sitting   Grooming: Wash/dry face;Wash/dry  hands;Supervision/safety;Sitting   Upper Body Bathing: Supervision/ safety;Sitting   Lower Body Bathing: Moderate assistance;Sitting/lateral leans   Upper Body Dressing : Minimal assistance;Sitting   Lower Body Dressing: Maximal assistance;Sitting/lateral leans                 General ADL Comments: Pt declining OOB mobility due to pain and nausea, Pt sitting EOB approximately 10 minutes with supervision. Pt frequently heaving due to nausea but did not vomit.      Vision         Perception     Praxis      Pertinent Vitals/Pain Pain Assessment: Faces Faces Pain Scale: Hurts little more Pain Location: right residual LE Pain Descriptors / Indicators: Operative site guarding;Grimacing Pain Intervention(s): Limited activity within patient's tolerance;Monitored during session;Patient requesting pain meds-RN notified;Repositioned     Hand Dominance Right   Extremity/Trunk Assessment Upper Extremity Assessment Upper Extremity Assessment: Overall WFL for tasks assessed   Lower Extremity Assessment Lower Extremity Assessment: Defer to PT evaluation       Communication Communication Communication: No difficulties   Cognition Arousal/Alertness: Awake/alert Behavior During Therapy: WFL for tasks assessed/performed Overall Cognitive Status: Within Functional Limits for tasks assessed  General Comments       Exercises     Shoulder Instructions      Home Living Family/patient expects to be discharged to:: Private residence Living Arrangements: Children Available Help at Discharge: Family;Friend(s);Available 24 hours/day   Home Access: Ramped entrance     Home Layout: One level     Bathroom Shower/Tub: Tub/shower unit;Walk-in shower   Bathroom Toilet: Standard     Home Equipment: Cane - single point;Walker - 2 wheels          Prior Functioning/Environment Level of Independence: Independent with  assistive device(s)        Comments: Using RW over past few weeks        OT Problem List: Decreased strength;Decreased activity tolerance;Impaired balance (sitting and/or standing);Decreased knowledge of use of DME or AE;Pain      OT Treatment/Interventions: Self-care/ADL training;Therapeutic exercise;DME and/or AE instruction;Therapeutic activities;Patient/family education;Balance training    OT Goals(Current goals can be found in the care plan section) Acute Rehab OT Goals Patient Stated Goal: to feel better and to not have any further surgeries OT Goal Formulation: With patient Time For Goal Achievement: 01/15/19 Potential to Achieve Goals: Good  OT Frequency: Min 3X/week   Barriers to D/C:            Co-evaluation PT/OT/SLP Co-Evaluation/Treatment: Yes Reason for Co-Treatment: Other (comment)(pt limited by nausea and not tolerating separate sessions )   OT goals addressed during session: ADL's and self-care      AM-PAC OT "6 Clicks" Daily Activity     Outcome Measure Help from another person eating meals?: None Help from another person taking care of personal grooming?: None Help from another person toileting, which includes using toliet, bedpan, or urinal?: A Lot Help from another person bathing (including washing, rinsing, drying)?: A Lot Help from another person to put on and taking off regular upper body clothing?: A Little Help from another person to put on and taking off regular lower body clothing?: A Lot 6 Click Score: 17   End of Session Nurse Communication: Mobility status;Patient requests pain meds  Activity Tolerance: Patient limited by pain(pt limited by nausea) Patient left: in bed;with call bell/phone within reach;with nursing/sitter in room  OT Visit Diagnosis: Unsteadiness on feet (R26.81);Muscle weakness (generalized) (M62.81);Pain Pain - Right/Left: Right Pain - part of body: Leg                Time: 9390-3009 OT Time Calculation (min): 20  min Charges:  OT General Charges $OT Visit: 1 Visit OT Evaluation $OT Eval Moderate Complexity: 1 Mod    Darrol Jump OTR/L [place] [phone] 01/01/2019, 11:42 AM

## 2019-01-02 LAB — GLUCOSE, CAPILLARY
Glucose-Capillary: 112 mg/dL — ABNORMAL HIGH (ref 70–99)
Glucose-Capillary: 148 mg/dL — ABNORMAL HIGH (ref 70–99)
Glucose-Capillary: 124 mg/dL — ABNORMAL HIGH (ref 70–99)
Glucose-Capillary: 113 mg/dL — ABNORMAL HIGH (ref 70–99)

## 2019-01-02 NOTE — Progress Notes (Signed)
Physical Therapy Treatment Patient Details Name: [name] MRN: [id number] DOB: 1966/03/18 Today's Date: 01/02/2019    History of Present Illness Pt is a 53 y.o. patient who is s/p R BKA. PMH signficant for CHF, anxiety, migraine, depression, DM, PVD, stroke.     PT Comments    Continuing work on functional mobility and activity tolerance;  Session limited by nausea, but, even withnot feeling well, Ms. Graves showed improvements in mobility and transfers; At this point, I still favor a CIR stay for training and therex and endurance in prep for eventual prosthesis training; Will also monitor for progress and the possibility of dc home; will place Rehab admissions screen  Follow Up Recommendations  CIR     Equipment Recommendations  Other (comment)(defer to next venue)    Recommendations for Other Services Rehab consult     Precautions / Restrictions Precautions Precautions: Fall Precaution Comments: wound VAC Restrictions RLE Weight Bearing: Non weight bearing    Mobility  Bed Mobility Overal bed mobility: Needs Assistance Bed Mobility: Supine to Sit     Supine to sit: Min assist     General bed mobility comments: min handheld assist to pull to sit  Transfers Overall transfer level: Needs assistance   Transfers: Squat Pivot Transfers     Squat pivot transfers: Min guard     General transfer comment: Performed squat pivot transfer bed to recliner (armrest down) placed on pt's R side; good reach across and very good lift of hips up and over to chair transferring to intact, L side  Ambulation/Gait                 Stairs             Wheelchair Mobility    Modified Rankin (Stroke Patients Only)       Balance     Sitting balance-Leahy Scale: Good                                      Cognition Arousal/Alertness: Awake/alert Behavior During Therapy: WFL for tasks assessed/performed;Flat affect Overall Cognitive  Status: Within Functional Limits for tasks assessed                                        Exercises      General Comments General comments (skin integrity, edema, etc.): Pt educated in what phantom pain and sensations are and in desensitization; Nausea persisted throughout session, and limited her activity tolerance      Pertinent Vitals/Pain Pain Assessment: Faces Faces Pain Scale: Hurts little more Pain Location: right residual LE Pain Descriptors / Indicators: Operative site guarding;Grimacing Pain Intervention(s): Monitored during session;Premedicated before session    Home Living                      Prior Function            PT Goals (current goals can now be found in the care plan section) Acute Rehab PT Goals Patient Stated Goal: to feel better and to not have any further surgeries PT Goal Formulation: With patient Time For Goal Achievement: 01/15/19 Potential to Achieve Goals: Good Progress towards PT goals: Progressing toward goals    Frequency    Min 5X/week      PT Plan Current plan remains  appropriate    Co-evaluation              AM-PAC PT "6 Clicks" Mobility   Outcome Measure  Help needed turning from your back to your side while in a flat bed without using bedrails?: None Help needed moving from lying on your back to sitting on the side of a flat bed without using bedrails?: A Little Help needed moving to and from a bed to a chair (including a wheelchair)?: A Little Help needed standing up from a chair using your arms (e.g., wheelchair or bedside chair)?: A Little Help needed to walk in hospital room?: A Lot Help needed climbing 3-5 steps with a railing? : A Lot 6 Click Score: 17    End of Session   Activity Tolerance: Other (comment)(Limited by nausea) Patient left: in chair;with call bell/phone within reach Nurse Communication: Mobility status PT Visit Diagnosis: Difficulty in walking, not elsewhere  classified (R26.2);Unsteadiness on feet (R26.81)     Time: 1043-1056(end time is approx) PT Time Calculation (min) (ACUTE ONLY): 13 min  Charges:  $Therapeutic Activity: 8-22 mins                     [name], PT  Acute Rehabilitation Services Pager [phone] Office [phone]    [name] 01/02/2019, 2:05 PM

## 2019-01-02 NOTE — Progress Notes (Signed)
I spoke with patient's son, Tommi Rumps, twice during my shift to give him updates and the patient called him once and spoke to him herself.

## 2019-01-02 NOTE — Progress Notes (Signed)
PT Cancellation Note  Patient Details Name: [name] MRN: [id number] DOB: 1966/08/18   Cancelled Treatment:    Reason Eval/Treat Not Completed: Other (comment)   Politely declining now and asking PT to return later;   Will make every effort to return;   [name], [place] Pager [phone] Office [phone]    [name] 01/02/2019, 10:13 AM

## 2019-01-03 LAB — GLUCOSE, CAPILLARY
Glucose-Capillary: 55 mg/dL — ABNORMAL LOW (ref 70–99)
Glucose-Capillary: 63 mg/dL — ABNORMAL LOW (ref 70–99)
Glucose-Capillary: 68 mg/dL — ABNORMAL LOW (ref 70–99)
Glucose-Capillary: 146 mg/dL — ABNORMAL HIGH (ref 70–99)
Glucose-Capillary: 63 mg/dL — ABNORMAL LOW (ref 70–99)
Glucose-Capillary: 134 mg/dL — ABNORMAL HIGH (ref 70–99)
Glucose-Capillary: 106 mg/dL — ABNORMAL HIGH (ref 70–99)
Glucose-Capillary: 148 mg/dL — ABNORMAL HIGH (ref 70–99)
Glucose-Capillary: 136 mg/dL — ABNORMAL HIGH (ref 70–99)
Glucose-Capillary: 146 mg/dL — ABNORMAL HIGH (ref 70–99)

## 2019-01-03 MED ORDER — ADULT MULTIVITAMIN W/MINERALS CH
1.0000 | ORAL_TABLET | Freq: Every day | ORAL | Status: DC
  Administered 2019-01-03 – 2019-01-05 (×3): 1 via ORAL
  Filled 2019-01-03 (×3): qty 1

## 2019-01-03 MED ORDER — GLUCERNA SHAKE PO LIQD
237.0000 mL | Freq: Every day | ORAL | Status: DC
  Administered 2019-01-03 – 2019-01-04 (×2): 237 mL via ORAL

## 2019-01-03 MED ORDER — JUVEN PO PACK
1.0000 | PACK | Freq: Two times a day (BID) | ORAL | Status: DC
  Administered 2019-01-03 – 2019-01-05 (×4): 1 via ORAL
  Filled 2019-01-03 (×5): qty 1

## 2019-01-03 NOTE — Progress Notes (Signed)
OT Cancellation Note  Patient Details Name: [name] MRN: [id number] DOB: [birth year]   Cancelled Treatment:    Reason Eval/Treat Not Completed: (pt declining due to nausea). Pt requesting to participate in therapy later this afternoon.  Therapist will return as time allows later this afternoon for OT treatment.     Darrol Jump OTR/L [place] [phone] 01/03/2019, 1:00 PM

## 2019-01-03 NOTE — Progress Notes (Signed)
Inpatient Rehabilitation-Admissions Coordinator    Met with patient at the bedside to discuss team's recommendation for inpatient rehabilitation. Shared booklets, expectations while in CIR, expected length of stay, and anticipated functional level at DC. Pt wanting to to pursue CIR at this time. AC will begin insurance authorization for possible admit.   Jhonnie Garner, OTR/L  Rehab Admissions Coordinator  [phone] 01/03/2019 3:24 PM

## 2019-01-03 NOTE — Plan of Care (Signed)

## 2019-01-03 NOTE — PMR Pre-admission (Signed)
PMR Admission Coordinator Pre-Admission Assessment  Patient: [name] is an 53 y.o., female MRN: [id number] DOB: 28-Sep-1965 Height: 5' 3" (160 cm) Weight: 58.9 kg  Insurance Information HMO:     PPO: Yes     PCP:      IPA:      80/20:      OTHER:  PRIMARY: BCBS      Policy#: [id number]      Subscriber: patient CM Name: (fax approval); confirmed with Juanda Chance      Phone#: [phone]     Fax#: Inpatient: [phone] Pre-Cert#: [id number]      Employer:  Josem Kaufmann provided by automated fax (confirmed by Haywood Filler). Pt is approved from 4/29-5/6 with last covered date 5/6 and clinical updates due 5/6. They prefer phone updates [phone]) and highly discouraged faxed updates due to time constraints.  Benefits:  Phone #: [phone]     Name: NA Eff. Date: 05/03/18 still active     Deduct: $1,750 (met $1,750)      Out of Pocket Max: $3,000 (includes deductible $3,000 met)      Life Max: NA CIR: 80%/20%      SNF: 80%/20%, limited to medical necessity reviewed through [place] [facility] Outpatient: 80%, limited to 90 visits/service period combined all therapies (PT/OT/ST)     Co-Pay: 20% Home Health: 80%, limited to 100 visits/service period      Co-Pay: 20% DME: 80%     Co-Pay: 20% Providers:  SECONDARY:       Policy#:       Subscriber:  CM Name:       Phone#:      Fax#:  Pre-Cert#:       Employer:  Benefits:  Phone #:      Name:  Eff. Date:      Deduct:       Out of Pocket Max:       Life Max:  CIR:      SNF:  Outpatient:      Co-Pay:  Home Health:       Co-Pay:  DME:     Co-Pay:   Medicaid Application Date:       Case Manager:  Disability Application Date:       Case Worker:   The "Data Collection Information Summary" for patients in Inpatient Rehabilitation Facilities with attached "Privacy Act [place] Records" was provided and verbally reviewed with: N/A  Emergency Contact Information Contact Information    Name Relation Home Work [address]    [name] Son [phone]        Current Medical History  Patient Admitting Diagnosis: Right BKA  History of Present Illness: [name] is a 53 year old right-handed female history of chronic kidney disease stage IIl, acute on chronic diastolic congestive heart failure,CAD with CABG October 2019, hypertension, intracerebral hemorrhage February 2017,hyperlipidemia,remote tobacco use 13 years ago, diabetes mellitus with peripheral vascular disease. Presented 12/31/2018 with gangrenous right foot with noted recent right first ray amputation. Noted wound dehiscence progressive cellulitis low-grade fever. Limb was not felt to be salvageable. Underwent right transtibial amputation 12/31/2018 per Dr. [name] Given. Hospital course pain management. Bouts of hypokalemia with supplement added. On 01/01/2019 rapid response called due to decreased responsiveness and desaturations after receiving oxycodone. Patient did receive Narcan with good results. All narcotics have since been discontinued.Acute on chronic anemia 10.9. Therapy evaluations completed with recommendations for CIR. Pt is to admit to CIR on 01/05/19.   [facility] has been reviewed by the rehabilitation admission coordinator and physician.  Past Medical History  Past Medical History:  Diagnosis Date  . Acute on chronic diastolic CHF (congestive heart failure) ([place]) 06/24/2018  . Anxiety   . COMMON MIGRAINE 10/07/2010  . Decreased visual acuity 11/10/2016  . Depression 12/18/2012  . Diabetes mellitus type II, uncontrolled ([place]) 10/07/2010   Qualifier: Diagnosis of  By: Charlett Blake MD, [name]    . Diabetic foot infection ([place]) 08/26/2016  . Disturbance of skin sensation 10/07/2010  . Gangrene of right foot ([place])   . Heart murmur   . History of kidney stones    "years ago"  . Hyperlipidemia 12/06/2010  . Hypertension   . Lipoma of abdominal wall 10/05/2016  . Overweight(278.02) 12/06/2010  . PERIPHERAL  NEUROPATHY, FEET 10/07/2010  . PVD (peripheral vascular disease) ([place]) 01/21/2012  . RESTLESS LEG SYNDROME 10/25/2010  . Stroke [facility]) 2014, 2017   most recently in 2/17 - intracerebral hemorrhage    Family History   family history includes Alcohol abuse in her brother; Arthritis in her mother; Diabetes in her paternal grandmother; Healthy in her son; Leukemia in her brother; Stroke in her brother.  Prior Rehab/Hospitalizations Has the patient had prior rehab or hospitalizations prior to admission? No  Has the patient had major surgery during 100 days prior to admission? Yes   Current Medications  Current Facility-Administered Medications:  .  0.9 %  sodium chloride infusion, , Intravenous, Continuous, [name], MD, Stopped at 12/31/18 1535 .  acetaminophen (TYLENOL) tablet 325-650 mg, 325-650 mg, Oral, Q6H PRN, [name], MD, 650 mg at 01/02/19 0927 .  amLODipine (NORVASC) tablet 5 mg, 5 mg, Oral, Daily, [name], MD, 5 mg at 01/05/19 0934 .  aspirin EC tablet 81 mg, 81 mg, Oral, Daily, [name], MD, 81 mg at 01/05/19 0934 .  atorvastatin (LIPITOR) tablet 80 mg, 80 mg, Oral, q1800, [name], MD, 80 mg at 01/04/19 1741 .  bisacodyl (DULCOLAX) suppository 10 mg, 10 mg, Rectal, Daily PRN, [name], MD .  docusate sodium (COLACE) capsule 100 mg, 100 mg, Oral, BID, [name], MD, 100 mg at 01/05/19 0934 .  escitalopram (LEXAPRO) tablet 10 mg, 10 mg, Oral, Daily, [name], MD, 10 mg at 01/05/19 0934 .  feeding supplement (GLUCERNA SHAKE) (GLUCERNA SHAKE) liquid 237 mL, 237 mL, Oral, QHS, [name], MD, 237 mL at 01/04/19 2134 .  insulin aspart (novoLOG) injection 0-15 Units, 0-15 Units, Subcutaneous, TID WC, [name], MD, 3 Units at 01/04/19 1741 .  insulin aspart (novoLOG) injection 4 Units, 4 Units, Subcutaneous, TID WC, [name], MD, 4 Units at 01/04/19 1740 .  insulin glargine (LANTUS) injection 40 Units, 40 Units, Subcutaneous,  QHS, [name], MD, 40 Units at 01/04/19 2134 .  isosorbide mononitrate (IMDUR) 24 hr tablet 15 mg, 15 mg, Oral, Daily, [name], MD, 15 mg at 01/05/19 0935 .  levothyroxine (SYNTHROID) tablet 25 mcg, 25 mcg, Oral, QAC breakfast, [name], MD, 25 mcg at 01/05/19 0818 .  magnesium citrate solution 1 Bottle, 1 Bottle, Oral, Once PRN, [name], MD .  metoCLOPramide (REGLAN) tablet 5-10 mg, 5-10 mg, Oral, Q8H PRN **OR** metoCLOPramide (REGLAN) injection 5-10 mg, 5-10 mg, Intravenous, Q8H PRN, [name], MD, 10 mg at 01/01/19 1238 .  metoprolol tartrate (LOPRESSOR) tablet 25 mg, 25 mg, Oral, BID, [name], MD, 25 mg at 01/05/19  0934 .  multivitamin with minerals tablet 1 tablet, 1 tablet, Oral, Daily, [name], MD, 1 tablet at 01/05/19 0934 .  nutrition supplement (JUVEN) (JUVEN) powder packet 1 packet, 1 packet, Oral, BID BM, [name], MD, 1 packet at 01/05/19 [id number] .  ondansetron (ZOFRAN) tablet 4 mg, 4 mg, Oral, Q6H PRN **OR** ondansetron (ZOFRAN) injection 4 mg, 4 mg, Intravenous, Q6H PRN, [name], MD, 4 mg at 01/03/19 0524 .  polyethylene glycol (MIRALAX / GLYCOLAX) packet 17 g, 17 g, Oral, Daily PRN, [name], MD  Patients Current Diet:  Diet Order            Diet - low sodium heart healthy        Diet Carb Modified Fluid consistency: Thin; Room service appropriate? Yes  Diet effective now              Precautions / Restrictions Precautions Precautions: Fall Precaution Comments: wound VAC Other Brace:  residual limb protector applied for session Restrictions Weight Bearing Restrictions: Yes RLE Weight Bearing: Non weight bearing   Has the patient had 2 or more falls or a fall with injury in the past year? No  Prior Activity Level Community (5-7x/wk): active PTA, worked at [employer] in [place]; drove PTA  Prior Functional Level Self Care: Did the patient need help bathing, dressing, using the toilet or eating? Independent  Indoor  Mobility: Did the patient need assistance with walking from room to room (with or without device)? Independent  Stairs: Did the patient need assistance with internal or external stairs (with or without device)? Independent  Functional Cognition: Did the patient need help planning regular tasks such as shopping or remembering to take medications? [place] Devices/Equipment: Cane (specify quad or straight), Walker (specify type), Other (Comment)(BOOT) Home Equipment: Cane - single point, Walker - 2 wheels  Prior Device Use: Indicate devices/aids used by the patient prior to current illness, exacerbation or injury? None of the above  Current Functional Level Cognition  Overall Cognitive Status: Within Functional Limits for tasks assessed Orientation Level: Oriented X4 General Comments: Pt with periods of eye closing during session, pt woke from a nap prior to session. Pt talking about nausea for entirety of session.     Extremity Assessment (includes Sensation/Coordination)  Upper Extremity Assessment: Overall WFL for tasks assessed, Defer to OT evaluation  Lower Extremity Assessment: RLE deficits/detail RLE Deficits / Details: wound VAC, protective shrinker in place as well RLE: Unable to fully assess due to pain    ADLs  Overall ADL's : Needs assistance/impaired Eating/Feeding: Independent, Sitting Grooming: Wash/dry face, Wash/dry hands, Supervision/safety, Sitting Upper Body Bathing: Supervision/ safety, Sitting Lower Body Bathing: Moderate assistance, Sitting/lateral leans Upper Body Dressing : Minimal assistance, Sitting Lower Body Dressing: Maximal assistance, Sitting/lateral leans, Moderate assistance Toilet Transfer: +2 for physical assistance, +2 for safety/equipment, RW, Squat-pivot Toilet Transfer Details (indicate cue type and reason): bed to chair ( 2 person A )   General ADL Comments: Upon standing with walker pt not  able to maintain upright standing. REturned to sitting and Aed ptto chair ( OT and CNA on each side)    Mobility  Overal bed mobility: Needs Assistance Bed Mobility: Sit to Supine Supine to sit: Mod assist Sit to supine: Min assist, HOB elevated General bed mobility comments: Pt up in chair upon PT arrival. Min assist for sit>supine for RLE management, assisting pt in scooting up in bed. Pt using trapeze  bar well with bed mobility.    Transfers  Overall transfer level: Needs assistance Equipment used: Rolling walker (2 wheeled) Transfers: Sit to/from Stand, Google Transfers Sit to Stand: Mod assist Squat pivot transfers: Mod assist General transfer comment: Sit to stand from recliner x5 for LLE strengthening and standing balance. Mod assist for power up, steadying, and verbal cuing for hand placement upon rising. Pt tries to place both UEs on RW, pt instructed to push with at least 1 UE from recliner. Squat pivot x2, once from recliner to [facility], and once from [facility] to bed. Mod assist for lifting torso off surface, hand placement, guiding pt to surface.    Ambulation / Gait / Stairs / Wheelchair Mobility  Ambulation/Gait Ambulation/Gait assistance: (unable to attempt, pt unsteady with static standing )    Posture / Balance Balance Overall balance assessment: Needs assistance Sitting-balance support: Feet supported, No upper extremity supported Sitting balance-Leahy Scale: Good Standing balance support: Bilateral upper extremity supported Standing balance-Leahy Scale: Poor Standing balance comment: unsteady in static standing, able to stand ~20 seconds before fatiguing/requiring seated rest.     Special needs/care consideration BiPAP/CPAP : no CPM : no Continuous Drip IV : 0.9% sodium chloride infusion Dialysis : no        Days : no Life Vest : no Oxygen on RA Special Bed : no Trach Size : no Wound Vac (area) yes      Location : RLE residual limb Skin : surgical incision to RLE                          Bowel mgmt: last BM: 4/27, continent Bladder mgmt: external catheter in place Diabetic mgmt: yes Behavioral consideration : no Chemo/radiation: no   Previous Home Environment (from acute therapy documentation) Living Arrangements: Children Available Help at Discharge: Family, Friend(s), Available 24 hours/day Type of Home: [place]: One level Home Access: Ramped entrance Bathroom Shower/Tub: Tub/shower unit, Multimedia programmer: Standard Home Care Services: No  Discharge Living Setting Plans for Discharge Living Setting: Patient's home, Lives with (comment)(son, daughter in law and 2 grandchildren (the kids have a babysitter during the day)) Type of Home at Discharge: House Discharge Home Layout: Two level, Able to live on main level with bedroom/bathroom Alternate Level Stairs-Rails: None(NA, able to live on first floor) Alternate Level Stairs-Number of Steps: NA, able to live on first floor Discharge Home Access: Stairs to enter, Ramped entrance Entrance Stairs-Rails: None Entrance Stairs-Number of Steps: 3 Discharge Bathroom Shower/Tub: Walk-in shower Discharge Bathroom Toilet: Standard Discharge Bathroom Accessibility: Yes How Accessible: Accessible via walker Does the patient have any problems obtaining your medications?: No  Social/Family/Support Systems Patient Roles: Other (Comment)(lives with son, daughter in law and grandchildren; worked full time at [employer]) Sport and exercise psychologist Information: son: Tommi Rumps [phone]) Anticipated Caregiver: son in morning prior to him going to work, then evenings after work and at night Anticipated Ambulance person Information: see above Ability/Limitations of Caregiver: Min A Caregiver Availability: Other (Comment)(when son not at work (9-5pm in work schedule) (intermittant assist) Discharge Plan Discussed with Primary Caregiver: Yes(son-family is present morning, evenings, and  nights) Is Caregiver In Agreement with Plan?: Yes Does Caregiver/Family have Issues with Lodging/Transportation while Pt is in Rehab?: No  Goals/Additional Needs Patient/Family Goal for Rehab: PT/OT: Mod I; SLP: NA Expected length of stay: 7-10 days Cultural Considerations: NA Dietary Needs: carb modified, thin liquids; medium calorie level 1600-2000.  Equipment Needs: TBD Pt/Family  Agrees to Admission and willing to participate: Yes Program Orientation Provided & Reviewed with Pt/Caregiver Including Roles  & Responsibilities: Yes(pt and son)  Barriers to Discharge: Lack of/limited family support, Weight bearing restrictions(son works during the day; limited assist if needed)  Decrease burden of Care through IP rehab admission: NA  Possible need for SNF placement upon discharge: Not anticipated; pt has good prognosis for further progress through CIR and has family who can assist at some points during the day.   Patient Condition: I have reviewed medical records from [facility], spoken with Dr. [name] Given, RN, and patient and son. I met with patient at the bedside for inpatient rehabilitation assessment.  Patient will benefit from ongoing PT and OT, can actively participate in 3 hours of therapy a day 5 days of the week, and can make measurable gains during the admission.  Patient will also benefit from the coordinated team approach during an Inpatient Acute Rehabilitation admission.  The patient will receive intensive therapy as well as Rehabilitation physician, nursing, social worker, and care management interventions.  Due to safety, skin/wound care, disease management, medication administration, pain management and patient education the patient requires 24 hour a day rehabilitation nursing.  The patient is currently Mod A for transfers and Max A for LB ADLs.  Discharge setting and therapy post discharge at home with home health is anticipated.  Patient has agreed to participate in  the Acute Inpatient Rehabilitation Program and will admit today.  Preadmission Screen Completed By:  Jhonnie Garner, 01/05/2019 2:39 PM ______________________________________________________________________   Discussed status with Dr. Posey Pronto on 01/05/19 at 2:38PM and received approval for admission today.  Admission Coordinator:  Jhonnie Garner, OT, time 2:38PM/Date 01/05/19   Assessment/Plan: Diagnosis: Right BKA  1. Does the need for close, 24 hr/day Medical supervision in concert with the patient's rehab needs make it unreasonable for this patient to be served in a less intensive setting? Yes  2. Co-Morbidities requiring supervision/potential complications: chronic kidney disease stage IIl, acute on chronic diastolic congestive heart failure,CAD with CABG October 2019, hypertension, intracerebral hemorrhage,hyperlipidemia,remote tobacco use, diabetes mellitus with peripheral vascular disease 3. Due to bladder management, bowel management, safety, skin/wound care, disease management, pain management and patient education, does the patient require 24 hr/day rehab nursing? Yes 4. Does the patient require coordinated care of a physician, rehab nurse, PT (1-2 hrs/day, 5 days/week) and OT (1-2 hrs/day, 5 days/week) to address physical and functional deficits in the context of the above medical diagnosis(es)? Yes Addressing deficits in the following areas: balance, endurance, locomotion, strength, transferring, bathing, dressing, toileting and psychosocial support 5. Can the patient actively participate in an intensive therapy program of at least 3 hrs of therapy 5 days a week? Yes 6. The potential for patient to make measurable gains while on inpatient rehab is excellent 7. Anticipated functional outcomes upon discharge from inpatients are: supervision PT, supervision OT, n/a SLP 8. Estimated rehab length of stay to reach the above functional goals is: 9-14 days. 9. Anticipated D/C setting:  Home 10. Anticipated post D/C treatments: HH therapy and Home excercise program 11. Overall Rehab/Functional Prognosis: good  MD Signature: Delice Lesch, MD, ABPMR

## 2019-01-03 NOTE — Progress Notes (Signed)
[name], female   DOB: 1966/03/15, 53 y.o.   MRN: [id number] Patient is status post right transtibial amputation.  Therapy has recommended inpatient rehab.  She is working with Museum/gallery curator for prosthetic fitting.  There is no drainage in the wound VAC canister.  Discharge to inpatient rehab when this is approved.

## 2019-01-03 NOTE — Progress Notes (Signed)
Inpatient Diabetes Program Recommendations  AACE/ADA: New Consensus Statement on Inpatient Glycemic Control (2015)  Target Ranges:  Prepandial:   less than 140 mg/dL      Peak postprandial:   less than 180 mg/dL (1-2 hours)      Critically ill patients:  140 - 180 mg/dL   Results for MECKENZIE, BALSLEY (MRN [id number]) as of 01/03/2019 14:20  Ref. Range 01/02/2019 07:47 01/02/2019 12:03 01/02/2019 17:06 01/02/2019 21:08  Glucose-Capillary Latest Ref Range: 70 - 99 mg/dL 112 (H) 148 (H)  6 units NOVOLOG  124 (H) 113 (H)    40 units LANTUS   Results for JACYNDA, BRUNKE (MRN [id number]) as of 01/03/2019 14:20  Ref. Range 01/03/2019 07:35 01/03/2019 08:07 01/03/2019 08:29 01/03/2019 08:49 01/03/2019 09:20 01/03/2019 11:52 01/03/2019 13:00  Glucose-Capillary Latest Ref Range: 70 - 99 mg/dL 55 (L) 68 (L) 63 (L) 63 (L) 146 (H) 134 (H) 106 (H)     Admit with: Gangrene of right foot  History: DM, CHF  Home DM Meds: Lantus 50 units BID  Current Orders: Lantus 40 units QHS         Novolog Moderate Correction Scale/ SSI (0-15 units) TID AC      Novolog 4 units TID with meals     Underwent Transtibial amputation and Application of Prevena wound VAC on 04/24.  Undergoing evaluation for Inpatient Rehab.    MD- Note patient Hypoglycemic this AM (CBG down to 55 mg/dl after receiving 40 units Lantus last PM.  Please consider reducing Lantus to 32 units QHS (20% overall reduction)    --Will follow patient during hospitalization--  Wyn Quaker RN, MSN, CDE Diabetes Coordinator Inpatient Glycemic Control Team Team Pager: [phone] (8a-5p)

## 2019-01-03 NOTE — Progress Notes (Signed)
Physical Therapy Treatment Patient Details Name: [name] MRN: [id number] DOB: [birth year] Today's Date: 01/03/2019    History of Present Illness Pt is a 53 y.o. patient who is s/p R BKA. PMH signficant for CHF, anxiety, migraine, depression, DM, PVD, stroke.     PT Comments    Pt reports being too nauseous to participate in OOB mobility this day, but is highly motivated to perform LE exercises in bed. Pt performed LE exercises well, with emphasis on RLE hip and knee extensors. Pt reports she wishes she could do more today, but is very limited by nausea. PT to continue to follow acutely, and will progress mobility as able.    Follow Up Recommendations  CIR     Equipment Recommendations  Other (comment)(defer )    Recommendations for Other Services Rehab consult     Precautions / Restrictions Precautions Precautions: Fall Precaution Comments: wound VAC Restrictions Weight Bearing Restrictions: Yes RLE Weight Bearing: Non weight bearing    Mobility  Bed Mobility Overal bed mobility: Needs Assistance             General bed mobility comments: Min assist for scooting up in bed post-LE exercises with use of bed pad. Pt with UE use on trapeze bar.   Transfers Overall transfer level: (NT - pt refused stating she was too nauseous)                  Ambulation/Gait                 Stairs             Wheelchair Mobility    Modified Rankin (Stroke Patients Only)       Balance                                            Cognition Arousal/Alertness: Awake/alert Behavior During Therapy: WFL for tasks assessed/performed;Flat affect Overall Cognitive Status: Within Functional Limits for tasks assessed                                        Exercises General Exercises - Lower Extremity Quad Sets: AROM;Right;10 reps;Supine Gluteal Sets: AROM;Both;10 reps;Supine Hip ABduction/ADduction: AROM;Right;10  reps;Supine Straight Leg Raises: AAROM;Right;10 reps;Supine    General Comments        Pertinent Vitals/Pain Pain Assessment: Faces Faces Pain Scale: Hurts a little bit Pain Location: right residual LE Pain Descriptors / Indicators: Operative site guarding;Grimacing Pain Intervention(s): Monitored during session;Repositioned;Limited activity within patient's tolerance    Home Living                      Prior Function            PT Goals (current goals can now be found in the care plan section) Acute Rehab PT Goals Patient Stated Goal: to feel better and to not have any further surgeries PT Goal Formulation: With patient Time For Goal Achievement: 01/15/19 Potential to Achieve Goals: Good Progress towards PT goals: Progressing toward goals    Frequency    Min 5X/week      PT Plan Current plan remains appropriate    Co-evaluation              AM-PAC PT "6 Clicks" Mobility  Outcome Measure  Help needed turning from your back to your side while in a flat bed without using bedrails?: A Little Help needed moving from lying on your back to sitting on the side of a flat bed without using bedrails?: A Little Help needed moving to and from a bed to a chair (including a wheelchair)?: A Lot Help needed standing up from a chair using your arms (e.g., wheelchair or bedside chair)?: A Lot Help needed to walk in hospital room?: A Lot Help needed climbing 3-5 steps with a railing? : Total 6 Click Score: 13    End of Session   Activity Tolerance: Other (comment);Patient limited by pain(pt limited by nausea) Patient left: in bed;with call bell/phone within reach Nurse Communication: Mobility status PT Visit Diagnosis: Muscle weakness (generalized) (M62.81);Other abnormalities of gait and mobility (R26.89)     Time: 8546-2703 PT Time Calculation (min) (ACUTE ONLY): 16 min  Charges:  $Therapeutic Exercise: 8-22 mins                     Alexa Conception Chancy,  PT Acute Rehabilitation Services Pager [phone]  Office [phone]  Roxine Caddy D Elonda Husky 01/03/2019, 4:48 PM

## 2019-01-03 NOTE — Progress Notes (Addendum)
Initial Nutrition Assessment  RD working remotely.  DOCUMENTATION CODES:   Not applicable  INTERVENTION:   -MVI with minerals daily -1 packet Juven BID, each packet provides 90 calories, 2.5 grams of protein, 8 grams of carbohydrate, and 14 grams of amino acids; supplement contains CaHMB, glutamine, and arginine, to promote wound healing -Glucerna Shake po q HS, each supplement provides 220 kcal and 10 grams of protein  NUTRITION DIAGNOSIS:   Increased nutrient needs related to post-op healing as evidenced by estimated needs.  GOAL:   Patient will meet greater than or equal to 90% of their needs  MONITOR:   PO intake, Supplement acceptance, Labs, Weight trends, Skin, I & O's  REASON FOR ASSESSMENT:   Other (Comment)    ASSESSMENT:   Patient is a 53 year old woman who is 2 weeks status post right foot first ray amputation.  Patient has noticed increased gangrenous changes wound dehiscence and painful cellulitis.  Patient states she does not feel well and states she has trouble eating.  She is currently on Augmentin twice a day.  Pt admitted with gangrene of rt foot (progressive gangrenous changed 2 weeks post-op or rt great toe).   4/24- s/p transtibial amputation and wound vac application  Reviewed I/O's: -900 ml x 24 hours and +439 ml since admission  UOP: 900 ml x 24 hours  Pt familiar to this RD due to prior admission.   Pt refusing PT treatment over the past 24 hours secondary to nausea. Per PT and MD notes, recommending CIR.   Per nursing notes, pt with nausea and pt with decreased appetite PTA due to this. Noted meal completion 100%. Pt with increased nutritional needs secondary to wound healing and wound vac.   Reviewed wt hx; noted pt has experienced a 18.3% wt loss over the past week. RD questions accuracy of wt and also suspects some wt loss is related to amputation.   Lab Results  Component Value Date   HGBA1C 10.5 (H) 12/12/2018   PTA DM medications  are 50 units insulin glargine BID.   Labs reviewed: CBGS: 63-146 (inpatient orders for glycemic control are 0-15 units insulin aspart TID with meals, 4 units insulin aspart TID with meals, and 40 units insulin glargine q HS).   NUTRITION - FOCUSED PHYSICAL EXAM:    Most Recent Value  Orbital Region  Unable to assess  Upper Arm Region  Unable to assess  Thoracic and Lumbar Region  Unable to assess  Buccal Region  Unable to assess  Temple Region  Unable to assess  Clavicle Bone Region  Unable to assess  Clavicle and Acromion Bone Region  Unable to assess  Scapular Bone Region  Unable to assess  Dorsal Hand  Unable to assess  Patellar Region  Unable to assess  Anterior Thigh Region  Unable to assess  Posterior Calf Region  Unable to assess  Edema (RD Assessment)  Unable to assess  Hair  Unable to assess  Eyes  Unable to assess  Mouth  Unable to assess  Skin  Unable to assess  Nails  Unable to assess       Diet Order:   Diet Order            Diet Carb Modified Fluid consistency: Thin; Room service appropriate? Yes  Diet effective now              EDUCATION NEEDS:   No education needs have been identified at this time  Skin:  Skin Assessment: Skin  Integrity Issues: Skin Integrity Issues:: Wound VAC Wound Vac: rt BKA site Incisions: n/a  Last BM:  12/31/18  Height:   Ht Readings from Last 1 Encounters:  12/31/18 5\' 3"  (1.6 m)    Weight:   Wt Readings from Last 1 Encounters:  12/31/18 58.9 kg    Ideal Body Weight:  48.9 kg(adjusted for rt BKA)  BMI:  Body mass index is 23 kg/m.  Estimated Nutritional Needs:   Kcal:  1500-1700  Protein:  85-100 grams  Fluid:  > 1.5 L    Jenifer A. Jimmye Norman, RD, LDN, [place] Registered Dietitian II Certified Diabetes Care and Education Specialist Pager: [phone] After hours Pager: [phone]

## 2019-01-03 NOTE — H&P (Signed)
Physical Medicine and Rehabilitation Admission H&P    Chief complaint: Stump pain  HPI: [name] is a 53 year old right-handed female history of chronic kidney disease stage IIl, acute on chronic diastolic congestive heart failure,CAD with CABG October 2019, hypertension, intracerebral hemorrhage February 2017,hyperlipidemia,remote tobacco use 13 years ago, diabetes mellitus with peripheral vascular disease.  Per chart review and patient, patient lives with her children. One level home with ramped entrance. Independent with assistive device. Presented 12/31/2018 with right foot gangrene.  She recently had a right first ray amputation.  Noted wound dehiscence progressive cellulitis low-grade fever. Limb was not felt to be salvageable. Underwent right BKA on 12/31/2018 per Dr. [name] Given. Hospital course complicated by pain and hypokalemia.  Potassium supplement added. On 01/01/2019 rapid response called due to decreased responsiveness and desaturations after receiving oxycodone. Patient did receive Narcan with good results. All narcotics of since been discontinued.Hospital course further complicated by acute on chronic anemia, hemoglobin 10.9 on 12/31/2018. Therapy evaluations completed patient was admitted for a comprehensive rehabilitation program.  Review of Systems  Constitutional: Negative for chills and fever.  HENT: Negative for hearing loss.   Eyes: Negative for blurred vision and double vision.  Respiratory: Positive for shortness of breath. Negative for cough.   Cardiovascular: Negative for chest pain and palpitations.  Gastrointestinal: Positive for constipation. Negative for heartburn, nausea and vomiting.  Genitourinary: Negative for dysuria, flank pain and hematuria.  Musculoskeletal: Positive for myalgias.  Skin: Negative for rash.  Neurological: Positive for headaches.  Psychiatric/Behavioral: The patient has insomnia.   All other systems reviewed and are negative.  Past  Medical History:  Diagnosis Date  . Acute on chronic diastolic CHF (congestive heart failure) ([place]) 06/24/2018  . Anxiety   . COMMON MIGRAINE 10/07/2010  . Decreased visual acuity 11/10/2016  . Depression 12/18/2012  . Diabetes mellitus type II, uncontrolled ([place]) 10/07/2010   Qualifier: Diagnosis of  By: Charlett Blake MD, [name]    . Diabetic foot infection ([place]) 08/26/2016  . Disturbance of skin sensation 10/07/2010  . Gangrene of right foot ([place])   . Heart murmur   . History of kidney stones    "years ago"  . Hyperlipidemia 12/06/2010  . Hypertension   . Lipoma of abdominal wall 10/05/2016  . Overweight(278.02) 12/06/2010  . PERIPHERAL NEUROPATHY, FEET 10/07/2010  . PVD (peripheral vascular disease) ([place]) 01/21/2012  . RESTLESS LEG SYNDROME 10/25/2010  . Stroke [facility]) 2014, 2017   most recently in 2/17 - intracerebral hemorrhage   Past Surgical History:  Procedure Laterality Date  . AMPUTATION Right 12/13/2018   Procedure: AMPUTATION RIGHT GREAT TOE, LOCAL RELOCATION OF TISSUE FOR WOUND CLOSURE 9cm x 3cm, VAC APPLICATION;  Surgeon: [name], MD;  Location: [place];  Service: Orthopedics;  Laterality: Right;  . AMPUTATION Right 12/31/2018   Procedure: RIGHT BELOW KNEE AMPUTATION;  Surgeon: [name], MD;  Location: [place];  Service: Orthopedics;  Laterality: Right;  . AMPUTATION TOE Left 09/09/2016   Procedure: AMPUTATION OF LEFT GREAT TOE;  Surgeon: Milly Jakob, MD;  Location: [place];  Service: Orthopedics;  Laterality: Left;  . CARDIAC CATHETERIZATION  06/24/2018  . CORONARY ARTERY BYPASS GRAFT N/A 06/28/2018   Procedure: CORONARY ARTERY BYPASS GRAFTING (CABG) times  four, using left internal mammary artery, endoscopically harvested right saphenous vein, and harvested left radial artery;  Surgeon: [name], MD;  Location: [place];  Service: Open Heart Surgery;  Laterality: N/A;  . ENDOVEIN HARVEST OF GREATER SAPHENOUS  VEIN Right 06/28/2018   Procedure: ENDOVEIN  HARVEST OF GREATER SAPHENOUS VEIN;  Surgeon: [name], MD;  Location: [place];  Service: Open Heart Surgery;  Laterality: Right;  . LEFT HEART CATH AND CORONARY ANGIOGRAPHY N/A 06/24/2018   Procedure: LEFT HEART CATH AND CORONARY ANGIOGRAPHY;  Surgeon: Troy Sine, MD;  Location: [place] [facility];  Service: Cardiovascular;  Laterality: N/A;  . RADIAL ARTERY HARVEST Left 06/28/2018   Procedure: RADIAL ARTERY HARVEST;  Surgeon: [name], MD;  Location: [place];  Service: Open Heart Surgery;  Laterality: Left;  . TEE WITHOUT CARDIOVERSION N/A 06/28/2018   Procedure: TRANSESOPHAGEAL ECHOCARDIOGRAM (TEE);  Surgeon: [name], MD;  Location: [place];  Service: Open Heart Surgery;  Laterality: N/A;  . WISDOM TOOTH EXTRACTION     Family History  Problem Relation Age of Onset  . Arthritis Mother   . Stroke Brother        previous smoker  . Alcohol abuse Brother        in remission  . Leukemia Brother   . Diabetes Paternal Grandmother   . Healthy Son    Social History:  reports that she quit smoking about 13 years ago. She smoked 0.50 packs per day. She has never used smokeless tobacco. She reports previous drug use. She reports that she does not drink alcohol. Allergies:  Allergies  Allergen Reactions  . Penicillins Other (See Comments)    made pt feel sick  Did it involve swelling of the face/tongue/throat, SOB, or low BP? No Did it involve sudden or severe rash/hives, skin peeling, or any reaction on the inside of your mouth or nose? No Did you need to seek medical attention at a hospital or doctor's office? No When did it last happen?April 2020 If all above answers are "NO", may proceed with cephalosporin use.   Recardo Evangelist [Pregabalin] Other (See Comments)    MADE PATIENT VERY EMOTIONAL AND WOULD CRY EASILY  . Morphine And Related Nausea And Vomiting  . Tramadol Nausea And Vomiting    Pt cant tolerate this pain med.    Medications Prior to  Admission  Medication Sig Dispense Refill  . acetaminophen (TYLENOL) 325 MG tablet Take 2 tablets (650 mg total) by mouth every 6 (six) hours as needed for mild pain or headache.    Marland Kitchen amLODipine (NORVASC) 5 MG tablet Take 1 tablet (5 mg total) by mouth daily. 30 tablet 1  . aspirin EC 81 MG tablet Take 81 mg by mouth daily.    Marland Kitchen atorvastatin (LIPITOR) 80 MG tablet Take 1 tablet (80 mg total) by mouth daily at 6 PM. 30 tablet 1  . azelastine (ASTELIN) 0.1 % nasal spray Place 2 sprays into both nostrils 2 (two) times daily. Use in each nostril as directed (Patient taking differently: Place 2 sprays into both nostrils 2 (two) times daily as needed for allergies. Use in each nostril as directed) 30 mL 3  . escitalopram (LEXAPRO) 10 MG tablet TAKE 1 TABLET BY MOUTH EVERY DAY (Patient taking differently: Take 10 mg by mouth daily. ) 30 tablet 0  . fluticasone (FLONASE) 50 MCG/ACT nasal spray SPRAY 2 SPRAYS INTO EACH NOSTRIL EVERY DAY (Patient taking differently: Place 2 sprays into both nostrils daily as needed for allergies. ) 16 g 6  . gabapentin (NEURONTIN) 100 MG capsule Take 3 capsules (300 mg total) by mouth 3 (three) times daily. 90 capsule 0  . isosorbide mononitrate (IMDUR) 30 MG 24 hr tablet  Take 0.5 tablets (15 mg total) by mouth daily. For one month then stop. 30 tablet 0  . LANTUS SOLOSTAR 100 UNIT/ML Solostar Pen Inject 50 Units into the skin 2 (two) times daily for 30 days. 30 mL 0  . levothyroxine (SYNTHROID, LEVOTHROID) 25 MCG tablet TAKE 1 TABLET (25 MCG TOTAL) BY MOUTH DAILY BEFORE BREAKFAST. (Patient taking differently: Take 25 mcg by mouth daily before breakfast. ) 90 tablet 1  . metoprolol tartrate (LOPRESSOR) 25 MG tablet Take 1 tablet (25 mg total) by mouth 2 (two) times daily. 60 tablet 1  . amoxicillin-clavulanate (AUGMENTIN) 875-125 MG tablet Take 1 tablet by mouth every 12 (twelve) hours for 28 days. (Patient not taking: Reported on 12/30/2018) 56 tablet 0  . benzonatate  (TESSALON) 100 MG capsule Take 1 capsule (100 mg total) by mouth 3 (three) times daily as needed for cough. (Patient not taking: Reported on 12/30/2018) 30 capsule 0  . Blood Glucose Monitoring Suppl (ONE TOUCH ULTRA 2) w/Device KIT Check blood sugars twice daily. Dx:E11.59 1 each 0  . Insulin Pen Needle (PEN NEEDLES) 31G X 5 MM MISC 1 Syringe by Does not apply route daily. To use with Lantus Pen 100 each 1  . ONE TOUCH ULTRA TEST test strip USE ONCE DAILY TO CHECK BLOOD SUGAR. DX E11.9 100 each 2    Drug Regimen Review Drug regimen was reviewed and remains appropriate with no significant issues identified  Home: Home Living Family/patient expects to be discharged to:: Private residence Living Arrangements: Children Available Help at Discharge: Family, Friend(s), Available 24 hours/day Type of Home: Arial Access: Ramped entrance Home Layout: One level Bathroom Shower/Tub: Public librarian, Multimedia programmer: Standard Home Equipment: [place] - single point, Environmental consultant - 2 wheels   Functional History: Prior Function Level of Independence: Independent with assistive device(s) Comments: Using RW over past few weeks  Functional Status:  Mobility: Bed Mobility Overal bed mobility: Needs Assistance Bed Mobility: Sit to Supine Supine to sit: Mod assist Sit to supine: Min assist, HOB elevated General bed mobility comments: Pt up in chair upon PT arrival. Min assist for sit>supine for RLE management, assisting pt in scooting up in bed. Pt using trapeze bar well with bed mobility. Transfers Overall transfer level: Needs assistance Equipment used: Rolling walker (2 wheeled) Transfers: Sit to/from Stand, Google Transfers Sit to Stand: Mod assist Squat pivot transfers: Mod assist General transfer comment: Sit to stand from recliner x5 for LLE strengthening and standing balance. Mod assist for power up, steadying, and verbal cuing for hand placement upon  rising. Pt tries to place both UEs on RW, pt instructed to push with at least 1 UE from recliner. Squat pivot x2, once from recliner to [facility], and once from [facility] to bed. Mod assist for lifting torso off surface, hand placement, guiding pt to surface. Ambulation/Gait Ambulation/Gait assistance: (unable to attempt, pt unsteady with static standing )    ADL: ADL Overall ADL's : Needs assistance/impaired Eating/Feeding: Independent, Sitting Grooming: Wash/dry face, Wash/dry hands, Supervision/safety, Sitting Upper Body Bathing: Supervision/ safety, Sitting Lower Body Bathing: Moderate assistance, Sitting/lateral leans Upper Body Dressing : Minimal assistance, Sitting Lower Body Dressing: Maximal assistance, Sitting/lateral leans, Moderate assistance Toilet Transfer: +2 for physical assistance, +2 for safety/equipment, RW, Squat-pivot Toilet Transfer Details (indicate cue type and reason): bed to chair ( 2 person A )   General ADL Comments: Upon standing with walker pt not able to maintain upright standing. REturned to sitting and Aed ptto  chair ( OT and CNA on each side)  Cognition: Cognition Overall Cognitive Status: Within Functional Limits for tasks assessed Orientation Level: Oriented X4 Cognition Arousal/Alertness: Lethargic Behavior During Therapy: WFL for tasks assessed/performed, Flat affect Overall Cognitive Status: Within Functional Limits for tasks assessed General Comments: Pt with periods of eye closing during session, pt woke from a nap prior to session. Pt talking about nausea for entirety of session.   Physical Exam: Blood pressure 128/76, pulse 64, temperature 98.8 F (37.1 C), temperature source Oral, resp. rate 16, height 5' 3" (1.6 m), weight 58.9 kg, last menstrual period 02/12/2011, SpO2 96 %. Physical Exam  Vitals reviewed. Constitutional: She appears well-developed and well-nourished.  HENT:  Head: Normocephalic and atraumatic.  Eyes: EOM are normal. Right eye  exhibits no discharge. Left eye exhibits no discharge.  Respiratory: Effort normal. No respiratory distress.  GI: She exhibits no distension.  Musculoskeletal:     Comments: Right stump edema and tenderness  Neurological: She is alert.  She follows full commands.  Motor: Bilateral upper extremities: 4+/5 proximal distal Left lower extremity: 4+/5 proximal to distal Right lower extremity: Hip flexion 4/5 (some pain inhibition) Sensation absent to touch left foot  Skin:  Amputation site is dressed with wound VAC. Appropriately tender.  Psychiatric: Her affect is blunt. Her speech is delayed. She is slowed.    Results for orders placed or performed during the hospital encounter of 12/31/18 (from the past 48 hour(s))  Glucose, capillary     Status: Abnormal   Collection Time: 01/03/19  5:54 PM  Result Value Ref Range   Glucose-Capillary 136 (H) 70 - 99 mg/dL   Comment 1 Document in Chart   Glucose, capillary     Status: Abnormal   Collection Time: 01/03/19  8:46 PM  Result Value Ref Range   Glucose-Capillary 146 (H) 70 - 99 mg/dL  Glucose, capillary     Status: Abnormal   Collection Time: 01/04/19  7:36 AM  Result Value Ref Range   Glucose-Capillary 62 (L) 70 - 99 mg/dL  Glucose, capillary     Status: Abnormal   Collection Time: 01/04/19  8:27 AM  Result Value Ref Range   Glucose-Capillary 115 (H) 70 - 99 mg/dL  Basic metabolic panel     Status: Abnormal   Collection Time: 01/04/19  8:44 AM  Result Value Ref Range   Sodium 135 135 - 145 mmol/L   Potassium 3.4 (L) 3.5 - 5.1 mmol/L   Chloride 93 (L) 98 - 111 mmol/L   CO2 28 22 - 32 mmol/L   Glucose, Bld 116 (H) 70 - 99 mg/dL   BUN 40 (H) 6 - 20 mg/dL   Creatinine, Ser 2.13 (H) 0.44 - 1.00 mg/dL   Calcium 8.5 (L) 8.9 - 10.3 mg/dL   GFR calc non Af Amer 25 (L) >60 mL/min   GFR calc Af Amer 29 (L) >60 mL/min   Anion gap 14 5 - 15    Comment: Performed at [facility], [address] [place], [place] [address]  Glucose,  capillary     Status: Abnormal   Collection Time: 01/04/19 12:41 PM  Result Value Ref Range   Glucose-Capillary 230 (H) 70 - 99 mg/dL  Glucose, capillary     Status: Abnormal   Collection Time: 01/04/19  4:29 PM  Result Value Ref Range   Glucose-Capillary 154 (H) 70 - 99 mg/dL  Glucose, capillary     Status: None   Collection Time: 01/04/19  9:02 PM  Result Value Ref Range   Glucose-Capillary 90 70 - 99 mg/dL  Glucose, capillary     Status: None   Collection Time: 01/05/19  7:19 AM  Result Value Ref Range   Glucose-Capillary 81 70 - 99 mg/dL  Glucose, capillary     Status: None   Collection Time: 01/05/19 12:32 PM  Result Value Ref Range   Glucose-Capillary 71 70 - 99 mg/dL   No results found.     Medical Problem List and Plan: 1.  Decreased functional mobility secondary to right BKA on 12/31/2018. Wound VAC as directed.  Admit to CIR 2.  Antithrombotics: -DVT/anticoagulation:  SCD left lower extremity  -antiplatelet therapy: aspirin 81 mg daily 3. Pain Management: Neurontin 300 mg 3 times a day,Robaxin as needed.patient on no other when necessary narcotics at this time due to increased sensitivity to pain medication 4. Mood: Lexapro 10 mg daily  -antipsychotic agents: N/A 5. Neuropsych: This patient is not yet fully capable of making decisions on her own behalf. 6. Skin/Wound Care:  Routine skin checks.  DC wound VAC 1 week postop. 7. Fluids/Electrolytes/Nutrition:  Routine ins and outs with follow-up BMP in the a.m. 8. Acute on chronic anemia. Follow-up CBC in a.m. 9. Diabetes mellitus peripheral neuropathy. Hemoglobin A1c 10.5.NovoLog 4 units 3 times a day, Lantus insulin 40 units daily at bedtime. Check blood sugars before meals and at bedtime. Diabetic teaching.  Monitor with increased mobility. 10. CAD with CABG. Continue aspirin. No chest pain or shortness of breath 11. Chronic diastolic congestive heart failure.  Monitor for signs and symptoms of fluid overload. 12.  Hypertension. Imdur 15 mg daily, Norvasc 5 mg daily.  Monitor with increased mobility. 13. Hyperlipidemia. Lipitor 14. Hypothyroidism. Synthroid 15.Chronic kidney disease. Creatinine baseline 1.32-2.04. 16. Constipation. Laxative assistance  Cathlyn Parsons, PA-C 01/05/2019

## 2019-01-04 ENCOUNTER — Encounter (HOSPITAL_COMMUNITY): Payer: Self-pay | Admitting: General Practice

## 2019-01-04 LAB — GLUCOSE, CAPILLARY
Glucose-Capillary: 115 mg/dL — ABNORMAL HIGH (ref 70–99)
Glucose-Capillary: 62 mg/dL — ABNORMAL LOW (ref 70–99)
Glucose-Capillary: 230 mg/dL — ABNORMAL HIGH (ref 70–99)
Glucose-Capillary: 154 mg/dL — ABNORMAL HIGH (ref 70–99)
Glucose-Capillary: 90 mg/dL (ref 70–99)

## 2019-01-04 LAB — BASIC METABOLIC PANEL
Anion gap: 14 (ref 5–15)
BUN: 40 mg/dL — ABNORMAL HIGH (ref 6–20)
CO2: 28 mmol/L (ref 22–32)
Calcium: 8.5 mg/dL — ABNORMAL LOW (ref 8.9–10.3)
Chloride: 93 mmol/L — ABNORMAL LOW (ref 98–111)
Creatinine, Ser: 2.13 mg/dL — ABNORMAL HIGH (ref 0.44–1.00)
GFR calc Af Amer: 29 mL/min — ABNORMAL LOW (ref >60)
GFR calc non Af Amer: 25 mL/min — ABNORMAL LOW (ref >60)
Glucose, Bld: 116 mg/dL — ABNORMAL HIGH (ref 70–99)
Potassium: 3.4 mmol/L — ABNORMAL LOW (ref 3.5–5.1)
Sodium: 135 mmol/L (ref 135–145)

## 2019-01-04 NOTE — Progress Notes (Signed)
Report received from [place], [place].  The patient appears to be sleeping when checked into room.  No distress noted.

## 2019-01-04 NOTE — Progress Notes (Signed)
Occupational Therapy Treatment Patient Details Name: BRANTLEIGH MIFFLIN MRN: [id number] DOB: 15-Mar-1966 Today's Date: 01/04/2019    History of present illness Pt is a 53 y.o. patient who is s/p R BKA. PMH signficant for CHF, anxiety, migraine, depression, DM, PVD, stroke.    OT comments  Pt agreed to OOB!  Follow Up Recommendations  CIR;Supervision/Assistance - 24 hour    Equipment Recommendations  Other (comment)(defer to next venue)    Recommendations for Other Services Rehab consult    Precautions / Restrictions Precautions Precautions: Fall Precaution Comments: wound VAC Restrictions Weight Bearing Restrictions: Yes RLE Weight Bearing: Non weight bearing       Mobility Bed Mobility Overal bed mobility: Needs Assistance Bed Mobility: Supine to Sit     Supine to sit: Mod assist        Transfers Overall transfer level: Needs assistance   Transfers: Sit to/from Stand;Squat Pivot Transfers Sit to Stand: +2 physical assistance   Squat pivot transfers: +2 safety/equipment          Balance Overall balance assessment: Needs assistance Sitting-balance support: Feet supported;No upper extremity supported Sitting balance-Leahy Scale: Good                                     ADL either performed or assessed with clinical judgement   ADL Overall ADL's : Needs assistance/impaired     Grooming: Wash/dry face;Wash/dry hands;Supervision/safety;Sitting           Upper Body Dressing : Minimal assistance;Sitting   Lower Body Dressing: Maximal assistance;Sitting/lateral leans;Moderate assistance   Toilet Transfer: +2 for physical assistance;+2 for safety/equipment;RW;Squat-pivot Toilet Transfer Details (indicate cue type and reason): bed to chair ( 2 person A )             General ADL Comments: Upon standing with walker pt not able to maintain upright standing. REturned to sitting and Aed ptto chair ( OT and CNA on each side)                Cognition Arousal/Alertness: Awake/alert Behavior During Therapy: WFL for tasks assessed/performed;Flat affect Overall Cognitive Status: Within Functional Limits for tasks assessed                                                     Pertinent Vitals/ Pain       Pain Assessment: No/denies pain     Prior Functioning/Environment              Frequency  Min 3X/week        Progress Toward Goals  OT Goals(current goals can now be found in the care plan section)  Progress towards OT goals: Progressing toward goals     Plan Discharge plan remains appropriate    Co-evaluation                 AM-PAC OT "6 Clicks" Daily Activity     Outcome Measure   Help from another person eating meals?: None Help from another person taking care of personal grooming?: None Help from another person toileting, which includes using toliet, bedpan, or urinal?: A Lot Help from another person bathing (including washing, rinsing, drying)?: A Lot Help from another person to put on and taking off regular upper body clothing?: A  Little Help from another person to put on and taking off regular lower body clothing?: A Lot 6 Click Score: 17    End of Session Equipment Utilized During Treatment: Gait belt  OT Visit Diagnosis: Unsteadiness on feet (R26.81);Muscle weakness (generalized) (M62.81);Pain Pain - Right/Left: Right Pain - part of body: Leg   Activity Tolerance Patient tolerated treatment well(pt limited by nausea)   Patient Left with call bell/phone within reach;with nursing/sitter in room;in chair   Nurse Communication Mobility status;Patient requests pain meds        Time: 1030-1050 OT Time Calculation (min): 20 min  Charges: OT General Charges $OT Visit: 1 Visit OT Treatments $Self Care/Home Management : 8-22 mins  Kari Baars, [place] [phone] Office- [phone]      REDDING, Edwena Felty  D 01/04/2019, 1:12 PM

## 2019-01-04 NOTE — Progress Notes (Signed)
The patient remains very drowsy but arouses with verbal and tactile stimuli.  No distress noted.  Color pale and appears to have edema in upper extremities.

## 2019-01-04 NOTE — Plan of Care (Signed)

## 2019-01-04 NOTE — Progress Notes (Addendum)
Subjective: 4 Days Post-Op Procedure(s) (LRB): RIGHT BELOW KNEE AMPUTATION (Right) Patient reports pain as mild.    Objective: Vital signs in last 24 hours: Temp:  [98.7 F (37.1 C)-99.5 F (37.5 C)] 99.5 F (37.5 C) (04/28 0348) Pulse Rate:  [68-73] 70 (04/28 0348) Resp:  [16-18] 16 (04/28 0348) BP: (108-127)/(57-70) 127/70 (04/28 0348) SpO2:  [95 %-97 %] 97 % (04/28 0348)  Intake/Output from previous day: 04/27 0701 - 04/28 0700 In: 720 [P.O.:720] Out: 600 [Urine:600] Intake/Output this shift: No intake/output data recorded.  No results for input(s): HGB in the last 72 hours. No results for input(s): WBC, RBC, HCT, PLT in the last 72 hours. No results for input(s): NA, K, CL, CO2, BUN, CREATININE, GLUCOSE, CALCIUM in the last 72 hours. No results for input(s): LABPT, INR in the last 72 hours.  VAC dressing intact over the right transtibial amputation site with no drainage in VAC canister.    Assessment/Plan: 4 Days Post-Op Procedure(s) (LRB): RIGHT BELOW KNEE AMPUTATION (Right) Continue VAC therapy.  Continue PT/OT HypoK- recheck K today Hopefully to rehab later today.    Erlinda Hong, PA-C 01/04/2019, [place]

## 2019-01-04 NOTE — Progress Notes (Signed)
Inpatient Diabetes Program Recommendations  AACE/ADA: New Consensus Statement on Inpatient Glycemic Control (2015)  Target Ranges:  Prepandial:   less than 140 mg/dL      Peak postprandial:   less than 180 mg/dL (1-2 hours)      Critically ill patients:  140 - 180 mg/dL   Lab Results  Component Value Date   GLUCAP 115 (H) 01/04/2019   HGBA1C 10.5 (H) 12/12/2018    Review of Glycemic Control  Hypoglycemia x past 2 days in am. Needs reduction in basal insulin.  Inpatient Diabetes Program Recommendations:     Decrease Lantus to 35 units QHS.  Continue to follow.  Thank you. Lorenda Peck, RD, LDN, CDE Inpatient Diabetes Coordinator [phone]

## 2019-01-04 NOTE — Progress Notes (Signed)
Inpatient Rehabilitation-Admissions Coordinator   Met with pt at the bedside and reviewed insurance benefits; pt remains interested in CIR.  AC awaiting insurance determination as well as bed availability.   Please call if questions.   Jhonnie Garner, OTR/L  Rehab Admissions Coordinator  [phone] 01/04/2019 2:51 PM

## 2019-01-04 NOTE — Progress Notes (Signed)
Physical Therapy Treatment Patient Details Name: [name] MRN: [id number] DOB: 06/09/1966 Today's Date: 01/04/2019    History of Present Illness Pt is a 53 y.o. patient who is s/p R BKA. PMH signficant for CHF, anxiety, migraine, depression, DM, PVD, stroke.     PT Comments    Pt with improved tolerance for PT session today vs yesterday, pt still limited by nausea. Pt requires mod assist for sit to stand and squat pivot at this time, but pt performed repeated sit to stands for LLE strengthening and standing balance well. Pt reports frustration to PT this session, stating she was independent PTA and she feels much weaker since surgery. Pt states "the reality is setting in now". PT educated pt on importance of exercise and mobility practice with PT/OT in order to return to independence. Pt with continued desire to d/c to CIR to receive intensive therapies. Will continue to follow acutely.    Follow Up Recommendations  CIR     Equipment Recommendations  Other (comment)(defer to next venue)    Recommendations for Other Services Rehab consult     Precautions / Restrictions Precautions Precautions: Fall Precaution Comments: wound VAC Required Braces or Orthoses: Other Brace Other Brace:  residual limb protector applied for session Restrictions Weight Bearing Restrictions: Yes RLE Weight Bearing: Non weight bearing    Mobility  Bed Mobility Overal bed mobility: Needs Assistance Bed Mobility: Sit to Supine     Supine to sit: Mod assist Sit to supine: Min assist;HOB elevated   General bed mobility comments: Pt up in chair upon PT arrival. Min assist for sit>supine for RLE management, assisting pt in scooting up in bed. Pt using trapeze bar well with bed mobility.  Transfers Overall transfer level: Needs assistance Equipment used: Rolling walker (2 wheeled) Transfers: Sit to/from [employer] Sit to Stand: Mod assist   Squat pivot transfers: Mod assist     General transfer comment: Sit to stand from recliner x5 for LLE strengthening and standing balance. Mod assist for power up, steadying, and verbal cuing for hand placement upon rising. Pt tries to place both UEs on RW, pt instructed to push with at least 1 UE from recliner. Squat pivot x2, once from recliner to [facility], and once from [facility] to bed. Mod assist for lifting torso off surface, hand placement, guiding pt to surface.  Ambulation/Gait Ambulation/Gait assistance: (unable to attempt, pt unsteady with static standing )               Stairs             Wheelchair Mobility    Modified Rankin (Stroke Patients Only)       Balance Overall balance assessment: Needs assistance Sitting-balance support: Feet supported;No upper extremity supported Sitting balance-Leahy Scale: Good     Standing balance support: Bilateral upper extremity supported Standing balance-Leahy Scale: Poor Standing balance comment: unsteady in static standing, able to stand ~20 seconds before fatiguing/requiring seated rest.                             Cognition Arousal/Alertness: Lethargic Behavior During Therapy: WFL for tasks assessed/performed;Flat affect Overall Cognitive Status: Within Functional Limits for tasks assessed                                 General Comments: Pt with periods of eye closing during session, pt woke  from a nap prior to session. Pt talking about nausea for entirety of session.       Exercises Other Exercises Other Exercises: Sit to stand x5 for LLE strengthening, standing balance. Pt able to stand for ~20 seconds before fatiguing and sitting in recliner.     General Comments        Pertinent Vitals/Pain Pain Assessment: Faces Faces Pain Scale: Hurts little more Pain Location: R residual LE, R quad Pain Descriptors / Indicators: Operative site guarding;Grimacing Pain Intervention(s): Monitored during session;Repositioned;Limited  activity within patient's tolerance    Home Living                      Prior Function            PT Goals (current goals can now be found in the care plan section) Acute Rehab PT Goals Patient Stated Goal: to feel better and to not have any further surgeries PT Goal Formulation: With patient Time For Goal Achievement: 01/15/19 Potential to Achieve Goals: Good Progress towards PT goals: Progressing toward goals    Frequency    Min 5X/week      PT Plan Current plan remains appropriate    Co-evaluation              AM-PAC PT "6 Clicks" Mobility   Outcome Measure  Help needed turning from your back to your side while in a flat bed without using bedrails?: A Little Help needed moving from lying on your back to sitting on the side of a flat bed without using bedrails?: A Little Help needed moving to and from a bed to a chair (including a wheelchair)?: A Lot Help needed standing up from a chair using your arms (e.g., wheelchair or bedside chair)?: A Lot Help needed to walk in hospital room?: Total Help needed climbing 3-5 steps with a railing? : Total 6 Click Score: 12    End of Session Equipment Utilized During Treatment: Gait belt Activity Tolerance: Other (comment);Patient limited by pain(nausea, frustation with feeling weak) Patient left: in bed;with call bell/phone within reach;with bed alarm set Nurse Communication: Mobility status(Spoke with NT about needing purewick placed) PT Visit Diagnosis: Muscle weakness (generalized) (M62.81);Other abnormalities of gait and mobility (R26.89)     Time: 1234-1310 PT Time Calculation (min) (ACUTE ONLY): 36 min  Charges:  $Therapeutic Activity: 23-37 mins                     Julien Girt, PT Acute Rehabilitation Services Pager [phone]  Office [phone]    Alexa D Elonda Husky 01/04/2019, 1:57 PM

## 2019-01-05 ENCOUNTER — Inpatient Hospital Stay (HOSPITAL_COMMUNITY)
Admission: RE | Admit: 2019-01-05 | Discharge: 2019-01-15 | DRG: 560 | Disposition: A | Discharge status: Home Health | Payer: BLUE CROSS/BLUE SHIELD | Source: Intra-hospital | Attending: Physical Medicine & Rehabilitation | Admitting: Physical Medicine & Rehabilitation

## 2019-01-05 ENCOUNTER — Other Ambulatory Visit: Payer: Self-pay

## 2019-01-05 ENCOUNTER — Encounter (HOSPITAL_COMMUNITY): Payer: Self-pay

## 2019-01-05 ENCOUNTER — Ambulatory Visit (INDEPENDENT_AMBULATORY_CARE_PROVIDER_SITE_OTHER): Payer: BLUE CROSS/BLUE SHIELD | Admitting: Family

## 2019-01-05 DIAGNOSIS — Z833 Family history of diabetes mellitus: Secondary | ICD-10-CM

## 2019-01-05 DIAGNOSIS — E1142 Type 2 diabetes mellitus with diabetic polyneuropathy: Secondary | ICD-10-CM | POA: Diagnosis present

## 2019-01-05 DIAGNOSIS — K5900 Constipation, unspecified: Secondary | ICD-10-CM | POA: Diagnosis present

## 2019-01-05 DIAGNOSIS — I2510 Atherosclerotic heart disease of native coronary artery without angina pectoris: Secondary | ICD-10-CM | POA: Diagnosis present

## 2019-01-05 DIAGNOSIS — E162 Hypoglycemia, unspecified: Secondary | ICD-10-CM

## 2019-01-05 DIAGNOSIS — Z7982 Long term (current) use of aspirin: Secondary | ICD-10-CM | POA: Diagnosis not present

## 2019-01-05 DIAGNOSIS — R0989 Other specified symptoms and signs involving the circulatory and respiratory systems: Secondary | ICD-10-CM

## 2019-01-05 DIAGNOSIS — N189 Chronic kidney disease, unspecified: Secondary | ICD-10-CM | POA: Diagnosis present

## 2019-01-05 DIAGNOSIS — E119 Type 2 diabetes mellitus without complications: Secondary | ICD-10-CM | POA: Diagnosis not present

## 2019-01-05 DIAGNOSIS — Z79899 Other long term (current) drug therapy: Secondary | ICD-10-CM | POA: Diagnosis not present

## 2019-01-05 DIAGNOSIS — Z79890 Hormone replacement therapy (postmenopausal): Secondary | ICD-10-CM

## 2019-01-05 DIAGNOSIS — Z87891 Personal history of nicotine dependence: Secondary | ICD-10-CM

## 2019-01-05 DIAGNOSIS — G2581 Restless legs syndrome: Secondary | ICD-10-CM | POA: Diagnosis not present

## 2019-01-05 DIAGNOSIS — I130 Hypertensive heart and chronic kidney disease with heart failure and stage 1 through stage 4 chronic kidney disease, or unspecified chronic kidney disease: Secondary | ICD-10-CM | POA: Diagnosis not present

## 2019-01-05 DIAGNOSIS — F32A Depression, unspecified: Secondary | ICD-10-CM

## 2019-01-05 DIAGNOSIS — E039 Hypothyroidism, unspecified: Secondary | ICD-10-CM | POA: Diagnosis not present

## 2019-01-05 DIAGNOSIS — Z89511 Acquired absence of right leg below knee: Secondary | ICD-10-CM

## 2019-01-05 DIAGNOSIS — I739 Peripheral vascular disease, unspecified: Secondary | ICD-10-CM | POA: Diagnosis not present

## 2019-01-05 DIAGNOSIS — Z8673 Personal history of transient ischemic attack (TIA), and cerebral infarction without residual deficits: Secondary | ICD-10-CM

## 2019-01-05 DIAGNOSIS — D62 Acute posthemorrhagic anemia: Secondary | ICD-10-CM | POA: Diagnosis not present

## 2019-01-05 DIAGNOSIS — Z4781 Encounter for orthopedic aftercare following surgical amputation: Principal | ICD-10-CM

## 2019-01-05 DIAGNOSIS — G546 Phantom limb syndrome with pain: Secondary | ICD-10-CM | POA: Diagnosis not present

## 2019-01-05 DIAGNOSIS — E1165 Type 2 diabetes mellitus with hyperglycemia: Secondary | ICD-10-CM

## 2019-01-05 DIAGNOSIS — Z823 Family history of stroke: Secondary | ICD-10-CM | POA: Diagnosis not present

## 2019-01-05 DIAGNOSIS — E785 Hyperlipidemia, unspecified: Secondary | ICD-10-CM | POA: Diagnosis not present

## 2019-01-05 DIAGNOSIS — S88111A Complete traumatic amputation at level between knee and ankle, right lower leg, initial encounter: Secondary | ICD-10-CM

## 2019-01-05 DIAGNOSIS — E11649 Type 2 diabetes mellitus with hypoglycemia without coma: Secondary | ICD-10-CM | POA: Diagnosis not present

## 2019-01-05 DIAGNOSIS — N183 Chronic kidney disease, stage 3 unspecified: Secondary | ICD-10-CM

## 2019-01-05 DIAGNOSIS — D649 Anemia, unspecified: Secondary | ICD-10-CM

## 2019-01-05 DIAGNOSIS — R110 Nausea: Secondary | ICD-10-CM | POA: Diagnosis not present

## 2019-01-05 DIAGNOSIS — I5032 Chronic diastolic (congestive) heart failure: Secondary | ICD-10-CM | POA: Diagnosis present

## 2019-01-05 DIAGNOSIS — E1122 Type 2 diabetes mellitus with diabetic chronic kidney disease: Secondary | ICD-10-CM | POA: Diagnosis present

## 2019-01-05 DIAGNOSIS — E1151 Type 2 diabetes mellitus with diabetic peripheral angiopathy without gangrene: Secondary | ICD-10-CM | POA: Diagnosis not present

## 2019-01-05 DIAGNOSIS — R7309 Other abnormal glucose: Secondary | ICD-10-CM | POA: Diagnosis not present

## 2019-01-05 DIAGNOSIS — Z951 Presence of aortocoronary bypass graft: Secondary | ICD-10-CM | POA: Diagnosis not present

## 2019-01-05 DIAGNOSIS — I10 Essential (primary) hypertension: Secondary | ICD-10-CM

## 2019-01-05 DIAGNOSIS — Z794 Long term (current) use of insulin: Secondary | ICD-10-CM

## 2019-01-05 DIAGNOSIS — F329 Major depressive disorder, single episode, unspecified: Secondary | ICD-10-CM

## 2019-01-05 DIAGNOSIS — I25810 Atherosclerosis of coronary artery bypass graft(s) without angina pectoris: Secondary | ICD-10-CM

## 2019-01-05 LAB — GLUCOSE, CAPILLARY
Glucose-Capillary: 81 mg/dL (ref 70–99)
Glucose-Capillary: 71 mg/dL (ref 70–99)
Glucose-Capillary: 64 mg/dL — ABNORMAL LOW (ref 70–99)
Glucose-Capillary: 76 mg/dL (ref 70–99)
Glucose-Capillary: 187 mg/dL — ABNORMAL HIGH (ref 70–99)

## 2019-01-05 MED ORDER — ONDANSETRON HCL 4 MG/2ML IJ SOLN
4.0000 mg | Freq: Four times a day (QID) | INTRAMUSCULAR | Status: DC | PRN
  Filled 2019-01-05: qty 2

## 2019-01-05 MED ORDER — ESCITALOPRAM OXALATE 10 MG PO TABS
10.0000 mg | ORAL_TABLET | Freq: Every day | ORAL | Status: DC
  Administered 2019-01-06 – 2019-01-15 (×10): 10 mg via ORAL
  Filled 2019-01-05 (×10): qty 1

## 2019-01-05 MED ORDER — AMLODIPINE BESYLATE 5 MG PO TABS
5.0000 mg | ORAL_TABLET | Freq: Every day | ORAL | Status: DC
  Administered 2019-01-06 – 2019-01-15 (×10): 5 mg via ORAL
  Filled 2019-01-05 (×10): qty 1

## 2019-01-05 MED ORDER — METOCLOPRAMIDE HCL 5 MG/ML IJ SOLN: 5.0000 mg | Freq: Three times a day (TID) | INTRAMUSCULAR | Status: DC | PRN

## 2019-01-05 MED ORDER — ONDANSETRON HCL 4 MG PO TABS
4.0000 mg | ORAL_TABLET | Freq: Four times a day (QID) | ORAL | Status: DC | PRN
  Administered 2019-01-06: 4 mg via ORAL
  Filled 2019-01-05: qty 1

## 2019-01-05 MED ORDER — POLYETHYLENE GLYCOL 3350 17 G PO PACK: 17.0000 g | PACK | Freq: Every day | ORAL | Status: DC | PRN

## 2019-01-05 MED ORDER — ATORVASTATIN CALCIUM 80 MG PO TABS
80.0000 mg | ORAL_TABLET | Freq: Every day | ORAL | Status: DC
  Administered 2019-01-05 – 2019-01-14 (×10): 80 mg via ORAL
  Filled 2019-01-05 (×10): qty 1

## 2019-01-05 MED ORDER — ISOSORBIDE MONONITRATE ER 30 MG PO TB24
15.0000 mg | ORAL_TABLET | Freq: Every day | ORAL | Status: DC
  Administered 2019-01-06 – 2019-01-15 (×10): 15 mg via ORAL
  Filled 2019-01-05 (×10): qty 1

## 2019-01-05 MED ORDER — INSULIN ASPART 100 UNIT/ML ~~LOC~~ SOLN
4.0000 [IU] | Freq: Three times a day (TID) | SUBCUTANEOUS | Status: DC
  Administered 2019-01-07 – 2019-01-10 (×3): 4 [IU] via SUBCUTANEOUS

## 2019-01-05 MED ORDER — ADULT MULTIVITAMIN W/MINERALS CH
1.0000 | ORAL_TABLET | Freq: Every day | ORAL | Status: DC
  Administered 2019-01-06 – 2019-01-15 (×10): 1 via ORAL
  Filled 2019-01-05 (×10): qty 1

## 2019-01-05 MED ORDER — JUVEN PO PACK
1.0000 | PACK | Freq: Two times a day (BID) | ORAL | Status: DC
  Administered 2019-01-06 – 2019-01-07 (×3): 1 via ORAL
  Filled 2019-01-05 (×20): qty 1

## 2019-01-05 MED ORDER — ASPIRIN EC 81 MG PO TBEC
81.0000 mg | DELAYED_RELEASE_TABLET | Freq: Every day | ORAL | Status: DC
  Administered 2019-01-06 – 2019-01-15 (×10): 81 mg via ORAL
  Filled 2019-01-05 (×10): qty 1

## 2019-01-05 MED ORDER — LEVOTHYROXINE SODIUM 25 MCG PO TABS
25.0000 ug | ORAL_TABLET | Freq: Every day | ORAL | Status: DC
  Administered 2019-01-06 – 2019-01-15 (×10): 25 ug via ORAL
  Filled 2019-01-05 (×10): qty 1

## 2019-01-05 MED ORDER — GLUCERNA SHAKE PO LIQD
237.0000 mL | Freq: Every day | ORAL | Status: DC
  Administered 2019-01-05 – 2019-01-14 (×5): 237 mL via ORAL

## 2019-01-05 MED ORDER — INSULIN ASPART 100 UNIT/ML ~~LOC~~ SOLN
0.0000 [IU] | Freq: Three times a day (TID) | SUBCUTANEOUS | Status: DC
  Administered 2019-01-06: 2 [IU] via SUBCUTANEOUS
  Administered 2019-01-07: 6 [IU] via SUBCUTANEOUS
  Administered 2019-01-08 – 2019-01-09 (×2): 3 [IU] via SUBCUTANEOUS
  Administered 2019-01-09 – 2019-01-11 (×2): 2 [IU] via SUBCUTANEOUS
  Administered 2019-01-11: 5 [IU] via SUBCUTANEOUS
  Administered 2019-01-12 – 2019-01-14 (×2): 3 [IU] via SUBCUTANEOUS

## 2019-01-05 MED ORDER — INSULIN GLARGINE 100 UNIT/ML ~~LOC~~ SOLN
40.0000 [IU] | Freq: Every day | SUBCUTANEOUS | Status: DC
  Administered 2019-01-05 – 2019-01-06 (×2): 40 [IU] via SUBCUTANEOUS
  Filled 2019-01-05 (×4): qty 0.4

## 2019-01-05 MED ORDER — METOCLOPRAMIDE HCL 5 MG PO TABS: 5.0000 mg | ORAL_TABLET | Freq: Three times a day (TID) | ORAL | Status: DC | PRN

## 2019-01-05 MED ORDER — DOCUSATE SODIUM 100 MG PO CAPS
100.0000 mg | ORAL_CAPSULE | Freq: Two times a day (BID) | ORAL | Status: DC
  Administered 2019-01-05 – 2019-01-15 (×20): 100 mg via ORAL
  Filled 2019-01-05 (×20): qty 1

## 2019-01-05 MED ORDER — SIMETHICONE 80 MG PO CHEW
80.0000 mg | CHEWABLE_TABLET | Freq: Four times a day (QID) | ORAL | Status: DC | PRN
  Administered 2019-01-05: 80 mg via ORAL
  Filled 2019-01-05: qty 1

## 2019-01-05 MED ORDER — ACETAMINOPHEN 325 MG PO TABS
325.0000 mg | ORAL_TABLET | Freq: Four times a day (QID) | ORAL | Status: DC | PRN
  Administered 2019-01-06 – 2019-01-14 (×16): 650 mg via ORAL
  Filled 2019-01-05 (×17): qty 2

## 2019-01-05 MED ORDER — SORBITOL 70 % SOLN: 30.0000 mL | Freq: Every day | Status: DC | PRN

## 2019-01-05 MED ORDER — METOPROLOL TARTRATE 25 MG PO TABS
25.0000 mg | ORAL_TABLET | Freq: Two times a day (BID) | ORAL | Status: DC
  Administered 2019-01-05 – 2019-01-15 (×20): 25 mg via ORAL
  Filled 2019-01-05 (×20): qty 1

## 2019-01-05 MED ORDER — BISACODYL 10 MG RE SUPP: 10.0000 mg | Freq: Every day | RECTAL | Status: DC | PRN

## 2019-01-05 NOTE — Progress Notes (Addendum)
Report given to [facility], [place], patient transported by staff , alert and oriented, vac to right leg intact.

## 2019-01-05 NOTE — Progress Notes (Signed)
[name], female   DOB: 1965-09-23, 53 y.o.   MRN: [id number] Patient is resting comfortably.  Her potassium is back to normal range at 3.4.  Patient's BUN and creatinine are elevated at 20 and 2.1 her baseline appears to be 30 and 1.9.  Patient is awaiting approval for admission to inpatient rehab.  No drainage in the wound VAC canister.

## 2019-01-05 NOTE — Significant Event (Signed)
Hypoglycemic Event  CBG: 64  Treatment: 4 oz juice/soda  Symptoms: None  Follow-up CBG: [id number] CBG Result:76  Possible Reasons for Event: Unknown  Comments/MD notified:Dan [place], [place] notified    Barrett Shell

## 2019-01-05 NOTE — Discharge Summary (Signed)
Discharge Diagnoses:  Active Problems:   Gangrene of right foot ([place])   History of below knee amputation, right [facility])   Surgeries: Procedure(s): RIGHT BELOW KNEE AMPUTATION on 12/31/2018    Consultants:   Discharged Condition: Improved  Hospital Course: [name] is an 53 y.o. female who was admitted 12/31/2018 with a chief complaint of gangrene right foot, with a final diagnosis of Gangrene Right Foot.  Patient was brought to the operating room on 12/31/2018 and underwent Procedure(s): RIGHT BELOW KNEE AMPUTATION.    Patient was given perioperative antibiotics:  Anti-infectives (From admission, onward)   Start     Dose/Rate Route Frequency Ordered Stop   12/31/18 1800  ceFAZolin (ANCEF) IVPB 1 g/50 mL premix     1 g 100 mL/hr over 30 Minutes Intravenous Every 6 hours 12/31/18 1414 01/01/19 1342   12/31/18 0747  clindamycin (CLEOCIN) 900 MG/50ML IVPB    Note to Pharmacy:  Barbie Haggis   : cabinet override      12/31/18 0747 12/31/18 0958   12/31/18 0745  clindamycin (CLEOCIN) IVPB 900 mg     900 mg 100 mL/hr over 30 Minutes Intravenous On call to O.R. 12/31/18 3875 12/31/18 6433    .  Patient was given sequential compression devices, early ambulation, and aspirin for DVT prophylaxis.  Recent vital signs:  Patient Vitals for the past 24 hrs:  BP Temp Temp src Pulse Resp SpO2  01/05/19 0454 (!) 149/76 99.1 F (37.3 C) Oral 71 16 95 %  01/04/19 1931 (!) 145/84 98.6 F (37 C) Oral 69 16 95 %  01/04/19 1527 113/64 98.6 F (37 C) Oral 63 16 95 %  .  Recent laboratory studies: No results found.  Discharge Medications:   Allergies as of 01/05/2019      Reactions   Penicillins Other (See Comments)   made pt feel sick  Did it involve swelling of the face/tongue/throat, SOB, or low BP? No Did it involve sudden or severe rash/hives, skin peeling, or any reaction on the inside of your mouth or nose? No Did you need to seek medical attention at a hospital or doctor's  office? No When did it last happen?April 2020 If all above answers are "NO", may proceed with cephalosporin use.   Lyrica [pregabalin] Other (See Comments)   MADE PATIENT VERY EMOTIONAL AND WOULD CRY EASILY   Morphine And Related Nausea And Vomiting   Tramadol Nausea And Vomiting   Pt cant tolerate this pain med.       Medication List    STOP taking these medications   amoxicillin-clavulanate 875-125 MG tablet Commonly known as:  AUGMENTIN   gabapentin 100 MG capsule Commonly known as:  NEURONTIN     TAKE these medications   acetaminophen 325 MG tablet Commonly known as:  TYLENOL Take 2 tablets (650 mg total) by mouth every 6 (six) hours as needed for mild pain or headache.   amLODipine 5 MG tablet Commonly known as:  NORVASC Take 1 tablet (5 mg total) by mouth daily.   aspirin EC 81 MG tablet Take 81 mg by mouth daily.   atorvastatin 80 MG tablet Commonly known as:  LIPITOR Take 1 tablet (80 mg total) by mouth daily at 6 PM.   azelastine 0.1 % nasal spray Commonly known as:  Astelin Place 2 sprays into both nostrils 2 (two) times daily. Use in each nostril as directed What changed:    when to take this  reasons to take this  benzonatate 100 MG capsule Commonly known as:  TESSALON Take 1 capsule (100 mg total) by mouth 3 (three) times daily as needed for cough.   escitalopram 10 MG tablet Commonly known as:  LEXAPRO TAKE 1 TABLET BY MOUTH EVERY DAY   fluticasone 50 MCG/ACT nasal spray Commonly known as:  FLONASE SPRAY 2 SPRAYS INTO EACH NOSTRIL EVERY DAY What changed:  See the new instructions.   isosorbide mononitrate 30 MG 24 hr tablet Commonly known as:  IMDUR Take 0.5 tablets (15 mg total) by mouth daily. For one month then stop.   Lantus SoloStar 100 UNIT/ML Solostar Pen Generic drug:  Insulin Glargine Inject 50 Units into the skin 2 (two) times daily for 30 days.   levothyroxine 25 MCG tablet Commonly known as:  SYNTHROID TAKE 1 TABLET  (25 MCG TOTAL) BY MOUTH DAILY BEFORE BREAKFAST. What changed:  See the new instructions.   metoprolol tartrate 25 MG tablet Commonly known as:  LOPRESSOR Take 1 tablet (25 mg total) by mouth 2 (two) times daily.   ONE TOUCH ULTRA 2 w/Device Kit Check blood sugars twice daily. Dx:E11.59   ONE TOUCH ULTRA TEST test strip Generic drug:  glucose blood USE ONCE DAILY TO CHECK BLOOD SUGAR. DX E11.9   Pen Needles 31G X 5 MM Misc 1 Syringe by Does not apply route daily. To use with Lantus Pen       Diagnostic Studies: US Renal  Result Date: 12/15/2018 CLINICAL DATA:  Acute kidney injury EXAM: RENAL / URINARY TRACT ULTRASOUND COMPLETE COMPARISON:  None. FINDINGS: Right Kidney: Renal measurements: 12.3 x 5.2 x 6.1 cm = volume: 205 mL . Echogenicity within normal limits. No mass or hydronephrosis visualized. Left Kidney: Renal measurements: 12.1 x 5.7 x 5.3 cm = volume: 190 mL. Echogenicity within normal limits. No mass or hydronephrosis visualized. Bladder: Appears normal for degree of bladder distention. IMPRESSION: No acute findings.  No hydronephrosis. Electronically Signed   By: Rolm Baptise M.D.   On: 12/15/2018 12:35   Dg Foot Complete Right  Result Date: 12/11/2018 CLINICAL DATA:  History of diabetes, now with wound involving the plantar surface of the right foot. EXAM: RIGHT FOOT COMPLETE - 3+ VIEW COMPARISON:  None. FINDINGS: There is a wound adjacent subcutaneous edema and stranding adjacent to the plantar surface of the base the great toe. No discrete radiopaque foreign body. No discrete areas of osteolysis to suggest osteomyelitis. Distal vascular calcifications. No fracture or dislocation. Joint spaces are preserved. No erosions. IMPRESSION: Wound involving the plantar surface of the base of the great toe without fracture, dislocation, radiopaque foreign body or radiographic evidence osteomyelitis. Electronically Signed   By: Sandi Mariscal M.D.   On: 12/11/2018 10:25    Patient benefited  maximally from their hospital stay and there were no complications.     Disposition: Discharge disposition: 02-Transferred to [facility]      Discharge Instructions    Call MD / Call 911   Complete by:  As directed    If you experience chest pain or shortness of breath, CALL 911 and be transported to the hospital emergency room.  If you develope a fever above 101 F, pus (white drainage) or increased drainage or redness at the wound, or calf pain, call your surgeon's office.   Constipation Prevention   Complete by:  As directed    Drink plenty of fluids.  Prune juice may be helpful.  You may use a stool softener, such as Colace (over the counter)  100 mg twice a day.  Use MiraLax (over the counter) for constipation as needed.   Diet - low sodium heart healthy   Complete by:  As directed    Increase activity slowly as tolerated   Complete by:  As directed    Negative Pressure Wound Therapy - Incisional   Complete by:  As directed    Change to portable pravena  pump for 1 week     Follow-up Information    [name], MD In 1 week.   Specialty:  Orthopedic Surgery Contact information: [place] [address] [phone]            Signed: [name] 01/05/2019, 12:26 PM

## 2019-01-05 NOTE — Progress Notes (Signed)
Physical Therapy Treatment Patient Details Name: [name] MRN: [id number] DOB: 19-Aug-1966 Today's Date: 01/05/2019    History of Present Illness Pt is a 53 y.o. pt admitted 12/31/18 with recent R first ray amputation, now with wound dehiscence, progressive cellulitis and fever. Underwent R BKA 4/24. Rapid response called 4/25 due to decreased responsiveness and desaturations after receiving oxycodone. PMH signficant for CHF, anxiety, migraine, depression, DM, PVD, stroke.    PT Comments    Pt progressing with mobility. ModA for bed mobility. Able to tolerate sitting balance and LE therex before c/o dizziness requiring return to supine. BP 137/76. Pt preparing for d/c to CIR this afternoon.   Follow Up Recommendations  CIR;Supervision for mobility/OOB     Equipment Recommendations  (TBD next venue)    Recommendations for Other Services       Precautions / Restrictions Precautions Precautions: Fall Precaution Comments: R BKA wound vac Required Braces or Orthoses: Other Brace Other Brace: Limb protector    Mobility  Bed Mobility Overal bed mobility: Needs Assistance Bed Mobility: Supine to Sit;Sit to Supine     Supine to sit: Mod assist;HOB elevated Sit to supine: Supervision   General bed mobility comments: ModA to assist trunk elevation, pt able to navigate BLEs to EOB well. Able to return to supine and reposition in bed without assist. Return to supine BP 137/76  Transfers Overall transfer level: (deferred due to c/o dizziness)                  Ambulation/Gait                 Stairs             Wheelchair Mobility    Modified Rankin (Stroke Patients Only)       Balance Overall balance assessment: Needs assistance Sitting-balance support: No upper extremity supported;Feet unsupported Sitting balance-Leahy Scale: Fair Sitting balance - Comments: Pt able to maintain static sitting balance fairly well without feet touching floor (bed  too high), but easily fatigued requiring intermittent UE support on rail to prevent posterior LOB. Required UE support to maintain dynamic sitting balance                                     Cognition Arousal/Alertness: Awake/alert Behavior During Therapy: Flat affect Overall Cognitive Status: No family/caregiver present to determine baseline cognitive functioning Area of Impairment: Attention;Memory;Following commands;Problem solving                   Current Attention Level: Sustained Memory: Decreased short-term memory Following Commands: Follows one step commands with increased time     Problem Solving: Slow processing;Requires verbal cues General Comments: Unsure if due to fatigue/lethargy, or if true cognitive impairment. Pt repeating questions PT had already answered, requiring cues to think back on what PT had already said, not always able to repeat answer. Inconsistent problem solving      Exercises Amputee Exercises Hip ABduction/ADduction: AROM;Both;Supine Knee Flexion: AROM;Right;Seated Knee Extension: AROM;Right;Seated Straight Leg Raises: AROM;Both;Supine    General Comments General comments (skin integrity, edema, etc.): Educ on importance of R residual limb guard removed daily to check skin integrity. Initiated discussion about progression to prosthetic limb as pt thinking she will have one in next couple months      Pertinent Vitals/Pain Pain Assessment: Faces Faces Pain Scale: Hurts a little bit Pain Location: R residual limb, medial thigh  Pain Descriptors / Indicators: Operative site guarding;Grimacing Pain Intervention(s): Limited activity within patient's tolerance(shinker sock readjusted above sore spot; wound vac line moved for pressure relief)    Home Living                      Prior Function            PT Goals (current goals can now be found in the care plan section) Acute Rehab PT Goals Patient Stated Goal: to  feel better and to not have any further surgeries PT Goal Formulation: With patient Time For Goal Achievement: 01/15/19 Potential to Achieve Goals: Good Progress towards PT goals: Progressing toward goals    Frequency    Min 5X/week      PT Plan Current plan remains appropriate    Co-evaluation              AM-PAC PT "6 Clicks" Mobility   Outcome Measure  Help needed turning from your back to your side while in a flat bed without using bedrails?: A Little Help needed moving from lying on your back to sitting on the side of a flat bed without using bedrails?: A Lot Help needed moving to and from a bed to a chair (including a wheelchair)?: A Lot Help needed standing up from a chair using your arms (e.g., wheelchair or bedside chair)?: A Lot Help needed to walk in hospital room?: Total Help needed climbing 3-5 steps with a railing? : Total 6 Click Score: 11    End of Session   Activity Tolerance: Patient limited by fatigue;Other (comment)(c/o dizziness) Patient left: in bed;with call bell/phone within reach Nurse Communication: Mobility status PT Visit Diagnosis: Muscle weakness (generalized) (M62.81);Other abnormalities of gait and mobility (R26.89)     Time: 7939-0300 PT Time Calculation (min) (ACUTE ONLY): 22 min  Charges:  $Therapeutic Exercise: 8-22 mins                    Mabeline Caras, PT, DPT Acute Rehabilitation Services  Pager [phone] Office [place] 01/05/2019, 5:26 PM

## 2019-01-05 NOTE — H&P (Signed)
Physical Medicine and Rehabilitation Admission H&P    Chief complaint: Stump pain  HPI: [name] is a 53 year old right-handed female history of chronic kidney disease stage IIl, acute on chronic diastolic congestive heart failure,CAD with CABG October 2019, hypertension, intracerebral hemorrhage February 2017,hyperlipidemia,remote tobacco use 13 years ago, diabetes mellitus with peripheral vascular disease.  Per chart review and patient, patient lives with her children. One level home with ramped entrance. Independent with assistive device. Presented 12/31/2018 with right foot gangrene.  She recently had a right first ray amputation.  Noted wound dehiscence progressive cellulitis low-grade fever. Limb was not felt to be salvageable. Underwent right BKA on 12/31/2018 per Dr. [name] Given. Hospital course complicated by pain and hypokalemia.  Potassium supplement added. On 01/01/2019 rapid response called due to decreased responsiveness and desaturations after receiving oxycodone. Patient did receive Narcan with good results. All narcotics of since been discontinued.Hospital course further complicated by acute on chronic anemia, hemoglobin 10.9 on 12/31/2018. Therapy evaluations completed patient was admitted for a comprehensive rehabilitation program.  Review of Systems  Constitutional: Negative for chills and fever.  HENT: Negative for hearing loss.   Eyes: Negative for blurred vision and double vision.  Respiratory: Positive for shortness of breath. Negative for cough.   Cardiovascular: Negative for chest pain and palpitations.  Gastrointestinal: Positive for constipation. Negative for heartburn, nausea and vomiting.  Genitourinary: Negative for dysuria, flank pain and hematuria.  Musculoskeletal: Positive for myalgias.  Skin: Negative for rash.  Neurological: Positive for headaches.  Psychiatric/Behavioral: The patient has insomnia.   All other systems reviewed and are negative.  Past  Medical History:  Diagnosis Date  . Acute on chronic diastolic CHF (congestive heart failure) ([place]) 06/24/2018  . Anxiety   . COMMON MIGRAINE 10/07/2010  . Decreased visual acuity 11/10/2016  . Depression 12/18/2012  . Diabetes mellitus type II, uncontrolled ([place]) 10/07/2010   Qualifier: Diagnosis of  By: Charlett Blake MD, [name]    . Diabetic foot infection ([place]) 08/26/2016  . Disturbance of skin sensation 10/07/2010  . Gangrene of right foot ([place])   . Heart murmur   . History of kidney stones    "years ago"  . Hyperlipidemia 12/06/2010  . Hypertension   . Lipoma of abdominal wall 10/05/2016  . Overweight(278.02) 12/06/2010  . PERIPHERAL NEUROPATHY, FEET 10/07/2010  . PVD (peripheral vascular disease) ([place]) 01/21/2012  . RESTLESS LEG SYNDROME 10/25/2010  . Stroke [facility]) 2014, 2017   most recently in 2/17 - intracerebral hemorrhage   Past Surgical History:  Procedure Laterality Date  . AMPUTATION Right 12/13/2018   Procedure: AMPUTATION RIGHT GREAT TOE, LOCAL RELOCATION OF TISSUE FOR WOUND CLOSURE 9cm x 3cm, VAC APPLICATION;  Surgeon: [name], MD;  Location: [place];  Service: Orthopedics;  Laterality: Right;  . AMPUTATION Right 12/31/2018   Procedure: RIGHT BELOW KNEE AMPUTATION;  Surgeon: [name], MD;  Location: [place];  Service: Orthopedics;  Laterality: Right;  . AMPUTATION TOE Left 09/09/2016   Procedure: AMPUTATION OF LEFT GREAT TOE;  Surgeon: Milly Jakob, MD;  Location: [place];  Service: Orthopedics;  Laterality: Left;  . CARDIAC CATHETERIZATION  06/24/2018  . CORONARY ARTERY BYPASS GRAFT N/A 06/28/2018   Procedure: CORONARY ARTERY BYPASS GRAFTING (CABG) times  four, using left internal mammary artery, endoscopically harvested right saphenous vein, and harvested left radial artery;  Surgeon: [name], MD;  Location: [place];  Service: Open Heart Surgery;  Laterality: N/A;  . ENDOVEIN HARVEST OF GREATER SAPHENOUS  VEIN Right 06/28/2018   Procedure: ENDOVEIN  HARVEST OF GREATER SAPHENOUS VEIN;  Surgeon: [name], MD;  Location: [place];  Service: Open Heart Surgery;  Laterality: Right;  . LEFT HEART CATH AND CORONARY ANGIOGRAPHY N/A 06/24/2018   Procedure: LEFT HEART CATH AND CORONARY ANGIOGRAPHY;  Surgeon: Troy Sine, MD;  Location: [place] [facility];  Service: Cardiovascular;  Laterality: N/A;  . RADIAL ARTERY HARVEST Left 06/28/2018   Procedure: RADIAL ARTERY HARVEST;  Surgeon: [name], MD;  Location: [place];  Service: Open Heart Surgery;  Laterality: Left;  . TEE WITHOUT CARDIOVERSION N/A 06/28/2018   Procedure: TRANSESOPHAGEAL ECHOCARDIOGRAM (TEE);  Surgeon: [name], MD;  Location: [place];  Service: Open Heart Surgery;  Laterality: N/A;  . WISDOM TOOTH EXTRACTION     Family History  Problem Relation Age of Onset  . Arthritis Mother   . Stroke Brother        previous smoker  . Alcohol abuse Brother        in remission  . Leukemia Brother   . Diabetes Paternal Grandmother   . Healthy Son    Social History:  reports that she quit smoking about 13 years ago. She smoked 0.50 packs per day. She has never used smokeless tobacco. She reports previous drug use. She reports that she does not drink alcohol. Allergies:  Allergies  Allergen Reactions  . Penicillins Other (See Comments)    made pt feel sick  Did it involve swelling of the face/tongue/throat, SOB, or low BP? No Did it involve sudden or severe rash/hives, skin peeling, or any reaction on the inside of your mouth or nose? No Did you need to seek medical attention at a hospital or doctor's office? No When did it last happen?April 2020 If all above answers are "NO", may proceed with cephalosporin use.   Recardo Evangelist [Pregabalin] Other (See Comments)    MADE PATIENT VERY EMOTIONAL AND WOULD CRY EASILY  . Morphine And Related Nausea And Vomiting  . Tramadol Nausea And Vomiting    Pt cant tolerate this pain med.    Medications Prior to  Admission  Medication Sig Dispense Refill  . acetaminophen (TYLENOL) 325 MG tablet Take 2 tablets (650 mg total) by mouth every 6 (six) hours as needed for mild pain or headache.    Marland Kitchen amLODipine (NORVASC) 5 MG tablet Take 1 tablet (5 mg total) by mouth daily. 30 tablet 1  . aspirin EC 81 MG tablet Take 81 mg by mouth daily.    Marland Kitchen atorvastatin (LIPITOR) 80 MG tablet Take 1 tablet (80 mg total) by mouth daily at 6 PM. 30 tablet 1  . azelastine (ASTELIN) 0.1 % nasal spray Place 2 sprays into both nostrils 2 (two) times daily. Use in each nostril as directed (Patient taking differently: Place 2 sprays into both nostrils 2 (two) times daily as needed for allergies. Use in each nostril as directed) 30 mL 3  . escitalopram (LEXAPRO) 10 MG tablet TAKE 1 TABLET BY MOUTH EVERY DAY (Patient taking differently: Take 10 mg by mouth daily. ) 30 tablet 0  . fluticasone (FLONASE) 50 MCG/ACT nasal spray SPRAY 2 SPRAYS INTO EACH NOSTRIL EVERY DAY (Patient taking differently: Place 2 sprays into both nostrils daily as needed for allergies. ) 16 g 6  . gabapentin (NEURONTIN) 100 MG capsule Take 3 capsules (300 mg total) by mouth 3 (three) times daily. 90 capsule 0  . isosorbide mononitrate (IMDUR) 30 MG 24 hr tablet  Take 0.5 tablets (15 mg total) by mouth daily. For one month then stop. 30 tablet 0  . LANTUS SOLOSTAR 100 UNIT/ML Solostar Pen Inject 50 Units into the skin 2 (two) times daily for 30 days. 30 mL 0  . levothyroxine (SYNTHROID, LEVOTHROID) 25 MCG tablet TAKE 1 TABLET (25 MCG TOTAL) BY MOUTH DAILY BEFORE BREAKFAST. (Patient taking differently: Take 25 mcg by mouth daily before breakfast. ) 90 tablet 1  . metoprolol tartrate (LOPRESSOR) 25 MG tablet Take 1 tablet (25 mg total) by mouth 2 (two) times daily. 60 tablet 1  . amoxicillin-clavulanate (AUGMENTIN) 875-125 MG tablet Take 1 tablet by mouth every 12 (twelve) hours for 28 days. (Patient not taking: Reported on 12/30/2018) 56 tablet 0  . benzonatate  (TESSALON) 100 MG capsule Take 1 capsule (100 mg total) by mouth 3 (three) times daily as needed for cough. (Patient not taking: Reported on 12/30/2018) 30 capsule 0  . Blood Glucose Monitoring Suppl (ONE TOUCH ULTRA 2) w/Device KIT Check blood sugars twice daily. Dx:E11.59 1 each 0  . Insulin Pen Needle (PEN NEEDLES) 31G X 5 MM MISC 1 Syringe by Does not apply route daily. To use with Lantus Pen 100 each 1  . ONE TOUCH ULTRA TEST test strip USE ONCE DAILY TO CHECK BLOOD SUGAR. DX E11.9 100 each 2    Drug Regimen Review Drug regimen was reviewed and remains appropriate with no significant issues identified  Home: Home Living Family/patient expects to be discharged to:: Private residence Living Arrangements: Children Available Help at Discharge: Family, Friend(s), Available 24 hours/day Type of Home: Arial Access: Ramped entrance Home Layout: One level Bathroom Shower/Tub: Public librarian, Multimedia programmer: Standard Home Equipment: [place] - single point, Environmental consultant - 2 wheels   Functional History: Prior Function Level of Independence: Independent with assistive device(s) Comments: Using RW over past few weeks  Functional Status:  Mobility: Bed Mobility Overal bed mobility: Needs Assistance Bed Mobility: Sit to Supine Supine to sit: Mod assist Sit to supine: Min assist, HOB elevated General bed mobility comments: Pt up in chair upon PT arrival. Min assist for sit>supine for RLE management, assisting pt in scooting up in bed. Pt using trapeze bar well with bed mobility. Transfers Overall transfer level: Needs assistance Equipment used: Rolling walker (2 wheeled) Transfers: Sit to/from Stand, Google Transfers Sit to Stand: Mod assist Squat pivot transfers: Mod assist General transfer comment: Sit to stand from recliner x5 for LLE strengthening and standing balance. Mod assist for power up, steadying, and verbal cuing for hand placement upon  rising. Pt tries to place both UEs on RW, pt instructed to push with at least 1 UE from recliner. Squat pivot x2, once from recliner to [facility], and once from [facility] to bed. Mod assist for lifting torso off surface, hand placement, guiding pt to surface. Ambulation/Gait Ambulation/Gait assistance: (unable to attempt, pt unsteady with static standing )    ADL: ADL Overall ADL's : Needs assistance/impaired Eating/Feeding: Independent, Sitting Grooming: Wash/dry face, Wash/dry hands, Supervision/safety, Sitting Upper Body Bathing: Supervision/ safety, Sitting Lower Body Bathing: Moderate assistance, Sitting/lateral leans Upper Body Dressing : Minimal assistance, Sitting Lower Body Dressing: Maximal assistance, Sitting/lateral leans, Moderate assistance Toilet Transfer: +2 for physical assistance, +2 for safety/equipment, RW, Squat-pivot Toilet Transfer Details (indicate cue type and reason): bed to chair ( 2 person A )   General ADL Comments: Upon standing with walker pt not able to maintain upright standing. REturned to sitting and Aed ptto  chair ( OT and CNA on each side)  Cognition: Cognition Overall Cognitive Status: Within Functional Limits for tasks assessed Orientation Level: Oriented X4 Cognition Arousal/Alertness: Lethargic Behavior During Therapy: WFL for tasks assessed/performed, Flat affect Overall Cognitive Status: Within Functional Limits for tasks assessed General Comments: Pt with periods of eye closing during session, pt woke from a nap prior to session. Pt talking about nausea for entirety of session.   Physical Exam: Blood pressure 128/76, pulse 64, temperature 98.8 F (37.1 C), temperature source Oral, resp. rate 16, height 5' 3" (1.6 m), weight 58.9 kg, last menstrual period 02/12/2011, SpO2 96 %. Physical Exam  Vitals reviewed. Constitutional: She appears well-developed and well-nourished.  HENT:  Head: Normocephalic and atraumatic.  Eyes: EOM are normal. Right eye  exhibits no discharge. Left eye exhibits no discharge.  Respiratory: Effort normal. No respiratory distress.  GI: She exhibits no distension.  Musculoskeletal:     Comments: Right stump edema and tenderness  Neurological: She is alert.  She follows full commands.  Motor: Bilateral upper extremities: 4+/5 proximal distal Left lower extremity: 4+/5 proximal to distal Right lower extremity: Hip flexion 4/5 (some pain inhibition) Sensation absent to touch left foot  Skin:  Amputation site is dressed with wound VAC. Appropriately tender.  Psychiatric: Her affect is blunt. Her speech is delayed. She is slowed.    Results for orders placed or performed during the hospital encounter of 12/31/18 (from the past 48 hour(s))  Glucose, capillary     Status: Abnormal   Collection Time: 01/03/19  5:54 PM  Result Value Ref Range   Glucose-Capillary 136 (H) 70 - 99 mg/dL   Comment 1 Document in Chart   Glucose, capillary     Status: Abnormal   Collection Time: 01/03/19  8:46 PM  Result Value Ref Range   Glucose-Capillary 146 (H) 70 - 99 mg/dL  Glucose, capillary     Status: Abnormal   Collection Time: 01/04/19  7:36 AM  Result Value Ref Range   Glucose-Capillary 62 (L) 70 - 99 mg/dL  Glucose, capillary     Status: Abnormal   Collection Time: 01/04/19  8:27 AM  Result Value Ref Range   Glucose-Capillary 115 (H) 70 - 99 mg/dL  Basic metabolic panel     Status: Abnormal   Collection Time: 01/04/19  8:44 AM  Result Value Ref Range   Sodium 135 135 - 145 mmol/L   Potassium 3.4 (L) 3.5 - 5.1 mmol/L   Chloride 93 (L) 98 - 111 mmol/L   CO2 28 22 - 32 mmol/L   Glucose, Bld 116 (H) 70 - 99 mg/dL   BUN 40 (H) 6 - 20 mg/dL   Creatinine, Ser 2.13 (H) 0.44 - 1.00 mg/dL   Calcium 8.5 (L) 8.9 - 10.3 mg/dL   GFR calc non Af Amer 25 (L) >60 mL/min   GFR calc Af Amer 29 (L) >60 mL/min   Anion gap 14 5 - 15    Comment: Performed at [facility], [address] [place], [place] [address]  Glucose,  capillary     Status: Abnormal   Collection Time: 01/04/19 12:41 PM  Result Value Ref Range   Glucose-Capillary 230 (H) 70 - 99 mg/dL  Glucose, capillary     Status: Abnormal   Collection Time: 01/04/19  4:29 PM  Result Value Ref Range   Glucose-Capillary 154 (H) 70 - 99 mg/dL  Glucose, capillary     Status: None   Collection Time: 01/04/19  9:02 PM  Result Value Ref Range   Glucose-Capillary 90 70 - 99 mg/dL  Glucose, capillary     Status: None   Collection Time: 01/05/19  7:19 AM  Result Value Ref Range   Glucose-Capillary 81 70 - 99 mg/dL  Glucose, capillary     Status: None   Collection Time: 01/05/19 12:32 PM  Result Value Ref Range   Glucose-Capillary 71 70 - 99 mg/dL   No results found.     Medical Problem List and Plan: 1.  Decreased functional mobility secondary to right BKA on 12/31/2018. Wound VAC as directed.  Admit to CIR 2.  Antithrombotics: -DVT/anticoagulation:  SCD left lower extremity  -antiplatelet therapy: aspirin 81 mg daily 3. Pain Management: Neurontin 300 mg 3 times a day,Robaxin as needed.patient on no other when necessary narcotics at this time due to increased sensitivity to pain medication 4. Mood: Lexapro 10 mg daily  -antipsychotic agents: N/A 5. Neuropsych: This patient is not yet fully capable of making decisions on her own behalf. 6. Skin/Wound Care:  Routine skin checks.  DC wound VAC 1 week postop. 7. Fluids/Electrolytes/Nutrition:  Routine ins and outs with follow-up BMP in the a.m. 8. Acute on chronic anemia. Follow-up CBC in a.m. 9. Diabetes mellitus peripheral neuropathy. Hemoglobin A1c 10.5.NovoLog 4 units 3 times a day, Lantus insulin 40 units daily at bedtime. Check blood sugars before meals and at bedtime. Diabetic teaching.  Monitor with increased mobility. 10. CAD with CABG. Continue aspirin. No chest pain or shortness of breath 11. Chronic diastolic congestive heart failure.  Monitor for signs and symptoms of fluid overload. 12.  Hypertension. Imdur 15 mg daily, Norvasc 5 mg daily.  Monitor with increased mobility. 13. Hyperlipidemia. Lipitor 14. Hypothyroidism. Synthroid 15.Chronic kidney disease. Creatinine baseline 1.32-2.04. 16. Constipation. Laxative assistance  Post Admission Physician Evaluation: 1. Preadmission assessment reviewed and changes made below. 2. Functional deficits secondary  to right BKA.  3. Patient is admitted to receive collaborative, interdisciplinary care between the physiatrist, rehab nursing staff, and therapy team. 4. Patient's level of medical complexity and substantial therapy needs in context of that medical necessity cannot be provided at a lesser intensity of care such as a SNF. 5. Patient has experienced substantial functional loss from his/her baseline which was documented above under the "Functional History" and "Functional Status" headings.  Judging by the patient's diagnosis, physical exam, and functional history, the patient has potential for functional progress which will result in measurable gains while on inpatient rehab.  These gains will be of substantial and practical use upon discharge  in facilitating mobility and self-care at the household level. 6. Physiatrist will provide 24 hour management of medical needs as well as oversight of the therapy plan/treatment and provide guidance as appropriate regarding the interaction of the two. 7. 24 hour rehab nursing will assist with safety, skin/wound care, disease management, pain management and patient education  and help integrate therapy concepts, techniques,education, etc. 8. PT will assess and treat for/with: Lower extremity strength, range of motion, stamina, balance, functional mobility, safety, adaptive techniques and equipment, wound care, coping skills, pain control, pre-prosthetic education. Goals are: Supervision/Mod I. 9. OT will assess and treat for/with: ADL's, functional mobility, safety, upper extremity strength,  adaptive techniques and equipment, wound mgt, ego support, and community reintegration.   Goals are: Supervision/Mod I. Therapy may not proceed with showering this patient. 10. Case Management and Social Worker will assess and treat for psychological issues and discharge planning. 11. Team conference will be  held weekly to assess progress toward goals and to determine barriers to discharge. 12. Patient will receive at least 3 hours of therapy per day at least 5 days per week. 13. ELOS: 10-14 days.       14. Prognosis:  good  I have personally performed a face to face diagnostic evaluation, including, but not limited to relevant history and physical exam findings, of this patient and developed relevant assessment and plan.  Additionally, I have reviewed and concur with the physician assistant's documentation above.  Delice Lesch, MD, ABPMR Lavon Paganini Angiulli, PA-C 01/05/2019

## 2019-01-05 NOTE — Plan of Care (Signed)
  Problem: Elimination: Goal: Will not experience complications related to bowel motility Outcome: Progressing Goal: Will not experience complications related to urinary retention Outcome: Progressing   Problem: Pain Managment: Goal: General experience of comfort will improve Outcome: Progressing   

## 2019-01-05 NOTE — Progress Notes (Signed)
Inpatient Rehabilitation-Admissions Coordinator   [facility] has received insurance approval and medical approval from Dr. [name] Given for admit to CIR today. Pt still wanting to pursue CIR. AC has updated RN, CM/SW regarding plan.   Please call if questions.   Jhonnie Garner, OTR/L  Rehab Admissions Coordinator  [phone] 01/05/2019 1:46 PM

## 2019-01-05 NOTE — Progress Notes (Signed)
MD in for rounds, addressed about patient being very sleepy, meds adjusted by MD. Will continue to monitor

## 2019-01-05 NOTE — Progress Notes (Signed)
Jamse Arn, MD  Physician  Physical Medicine and Rehabilitation  PMR Pre-admission  Signed  Date of Service:  01/03/2019 6:40 PM       Related encounter: Admission (Discharged) from 12/31/2018 in [facility]         PMR Admission Coordinator Pre-Admission Assessment  Patient: [name] is an 53 y.o., female MRN: [id number] DOB: 04/09/1966 Height: _0  (160 cm) Weight: 58.9 kg  Insurance Information HMO:     PPO: Yes     PCP:      IPA:      80/20:      OTHER:  PRIMARY: BCBS      Policy#: [id number]      Subscriber: patient CM Name: (fax approval); confirmed with Juanda Chance      Phone#: [phone]     Fax#: Inpatient: [phone] Pre-Cert#: [id number]      Employer:  Josem Kaufmann provided by automated fax (confirmed by Haywood Filler). Pt is approved from 4/29-5/6 with last covered date 5/6 and clinical updates due 5/6. They prefer phone updates [phone]) and highly discouraged faxed updates due to time constraints.  Benefits:  Phone #: [phone]     Name: NA Eff. Date: 05/03/18 still active     Deduct: $1,750 (met $1,750)      Out of Pocket Max: $3,000 (includes deductible $3,000 met)      Life Max: NA CIR: 80%/20%      SNF: 80%/20%, limited to medical necessity reviewed through [employer] Outpatient: 80%, limited to 90 visits/service period combined all therapies (PT/OT/ST)     Co-Pay: 20% Home Health: 80%, limited to 100 visits/service period      Co-Pay: 20% DME: 80%     Co-Pay: 20% Providers:  SECONDARY:       Policy#:       Subscriber:  CM Name:       Phone#:      Fax#:  Pre-Cert#:       Employer:  Benefits:  Phone #:      Name:  Eff. Date:      Deduct:       Out of Pocket Max:       Life Max:  CIR:      SNF:  Outpatient:      Co-Pay:  Home Health:       Co-Pay:  DME:     Co-Pay:   Medicaid Application Date:       Case Manager:  Disability Application Date:       Case Worker:   The  "Data Collection Information Summary" for patients in Inpatient Rehabilitation Facilities with attached "Privacy Act [place] Records" was provided and verbally reviewed with: N/A  Emergency Contact Information         Contact Information    Name Relation Home Work [address]   [name] Son [phone]        Current Medical History  Patient Admitting Diagnosis: Right BKA  History of Present Illness: [name] is a 53 year old right-handed female history ofchronic kidney disease stage IIl,acute on chronic diastolic congestive heart failure,CAD with CABG October 2019, hypertension, intracerebral hemorrhage February 2017,hyperlipidemia,remote tobacco use 13 years ago, diabetes mellitus with peripheral vascular disease. Presented04/24/2020 with gangrenous right foot with noted recent right first ray amputation. Noted wound dehiscence progressive cellulitis low-grade fever. Limb was not felt to be salvageable. Underwent right transtibial amputation 12/31/2018 per Dr. [name] Given. Hospital course  pain management. Bouts of hypokalemia with supplement added.On 01/01/2019 rapid response called due to decreased responsiveness and desaturations after receiving oxycodone. Patient did receive Narcan with good results. All narcotics have since been discontinued.Acute on chronic anemia 10.9. Therapy evaluations completed with recommendations for CIR. Pt is to admit to CIR on 01/05/19. Patient[facility] has been reviewed by the rehabilitation admission coordinator and physician.  Past Medical History      Past Medical History:  Diagnosis Date  . Acute on chronic diastolic CHF (congestive heart failure) ([place]) 06/24/2018  . Anxiety   . COMMON MIGRAINE 10/07/2010  . Decreased visual acuity 11/10/2016  . Depression 12/18/2012  . Diabetes mellitus type II, uncontrolled ([place]) 10/07/2010   Qualifier: Diagnosis of  By: Charlett Blake MD, [name]    .  Diabetic foot infection ([place]) 08/26/2016  . Disturbance of skin sensation 10/07/2010  . Gangrene of right foot ([place])   . Heart murmur   . History of kidney stones    "years ago"  . Hyperlipidemia 12/06/2010  . Hypertension   . Lipoma of abdominal wall 10/05/2016  . Overweight(278.02) 12/06/2010  . PERIPHERAL NEUROPATHY, FEET 10/07/2010  . PVD (peripheral vascular disease) ([place]) 01/21/2012  . RESTLESS LEG SYNDROME 10/25/2010  . Stroke [facility]) 2014, 2017   most recently in 2/17 - intracerebral hemorrhage    Family History   family history includes Alcohol abuse in her brother; Arthritis in her mother; Diabetes in her paternal grandmother; Healthy in her son; Leukemia in her brother; Stroke in her brother.  Prior Rehab/Hospitalizations Has the patient had prior rehab or hospitalizations prior to admission? No  Has the patient had major surgery during 100 days prior to admission? Yes             Current Medications  Current Facility-Administered Medications:  .  0.9 %  sodium chloride infusion, , Intravenous, Continuous, [name], MD, Stopped at 12/31/18 1535 .  acetaminophen (TYLENOL) tablet 325-650 mg, 325-650 mg, Oral, Q6H PRN, [name], MD, 650 mg at 01/02/19 0927 .  amLODipine (NORVASC) tablet 5 mg, 5 mg, Oral, Daily, [name], MD, 5 mg at 01/05/19 0934 .  aspirin EC tablet 81 mg, 81 mg, Oral, Daily, [name], MD, 81 mg at 01/05/19 0934 .  atorvastatin (LIPITOR) tablet 80 mg, 80 mg, Oral, q1800, [name], MD, 80 mg at 01/04/19 1741 .  bisacodyl (DULCOLAX) suppository 10 mg, 10 mg, Rectal, Daily PRN, [name], MD .  docusate sodium (COLACE) capsule 100 mg, 100 mg, Oral, BID, [name], MD, 100 mg at 01/05/19 0934 .  escitalopram (LEXAPRO) tablet 10 mg, 10 mg, Oral, Daily, [name], MD, 10 mg at 01/05/19 0934 .  feeding supplement (GLUCERNA SHAKE) (GLUCERNA SHAKE) liquid 237 mL, 237 mL, Oral, QHS, [name], MD, 237 mL at  01/04/19 2134 .  insulin aspart (novoLOG) injection 0-15 Units, 0-15 Units, Subcutaneous, TID WC, [name], MD, 3 Units at 01/04/19 1741 .  insulin aspart (novoLOG) injection 4 Units, 4 Units, Subcutaneous, TID WC, [name], MD, 4 Units at 01/04/19 1740 .  insulin glargine (LANTUS) injection 40 Units, 40 Units, Subcutaneous, QHS, [name], MD, 40 Units at 01/04/19 2134 .  isosorbide mononitrate (IMDUR) 24 hr tablet 15 mg, 15 mg, Oral, Daily, [name], MD, 15 mg at 01/05/19 0935 .  levothyroxine (SYNTHROID) tablet 25 mcg, 25 mcg, Oral, QAC breakfast, [name],  MD, 25 mcg at 01/05/19 0818 .  magnesium citrate solution 1 Bottle, 1 Bottle, Oral, Once PRN, [name], MD .  metoCLOPramide (REGLAN) tablet 5-10 mg, 5-10 mg, Oral, Q8H PRN **OR** metoCLOPramide (REGLAN) injection 5-10 mg, 5-10 mg, Intravenous, Q8H PRN, [name], MD, 10 mg at 01/01/19 1238 .  metoprolol tartrate (LOPRESSOR) tablet 25 mg, 25 mg, Oral, BID, [name], MD, 25 mg at 01/05/19 0934 .  multivitamin with minerals tablet 1 tablet, 1 tablet, Oral, Daily, [name], MD, 1 tablet at 01/05/19 0934 .  nutrition supplement (JUVEN) (JUVEN) powder packet 1 packet, 1 packet, Oral, BID BM, [name], MD, 1 packet at 01/05/19 [id number] .  ondansetron (ZOFRAN) tablet 4 mg, 4 mg, Oral, Q6H PRN **OR** ondansetron (ZOFRAN) injection 4 mg, 4 mg, Intravenous, Q6H PRN, [name], MD, 4 mg at 01/03/19 0524 .  polyethylene glycol (MIRALAX / GLYCOLAX) packet 17 g, 17 g, Oral, Daily PRN, [name], MD  Patients Current Diet:     Diet Order                  Diet - low sodium heart healthy         Diet Carb Modified Fluid consistency: Thin; Room service appropriate? Yes  Diet effective now               Precautions / Restrictions Precautions Precautions: Fall Precaution Comments: wound VAC Other Brace:  residual limb protector applied for session Restrictions Weight  Bearing Restrictions: Yes RLE Weight Bearing: Non weight bearing   Has the patient had 2 or more falls or a fall with injury in the past year? No  Prior Activity Level Community (5-7x/wk): active PTA, worked at [employer] in [place]; drove PTA  Prior Functional Level Self Care: Did the patient need help bathing, dressing, using the toilet or eating? Independent  Indoor Mobility: Did the patient need assistance with walking from room to room (with or without device)? Independent  Stairs: Did the patient need assistance with internal or external stairs (with or without device)? Independent  Functional Cognition: Did the patient need help planning regular tasks such as shopping or remembering to take medications? [place] / [place] Devices/Equipment: Cane (specify quad or straight), Walker (specify type), Other (Comment)(BOOT) Home Equipment: Cane - single point, Walker - 2 wheels  Prior Device Use: Indicate devices/aids used by the patient prior to current illness, exacerbation or injury? None of the above  Current Functional Level Cognition  Overall Cognitive Status: Within Functional Limits for tasks assessed Orientation Level: Oriented X4 General Comments: Pt with periods of eye closing during session, pt woke from a nap prior to session. Pt talking about nausea for entirety of session.     Extremity Assessment (includes Sensation/Coordination)  Upper Extremity Assessment: Overall WFL for tasks assessed, Defer to OT evaluation  Lower Extremity Assessment: RLE deficits/detail RLE Deficits / Details: wound VAC, protective shrinker in place as well RLE: Unable to fully assess due to pain    ADLs  Overall ADL's : Needs assistance/impaired Eating/Feeding: Independent, Sitting Grooming: Wash/dry face, Wash/dry hands, Supervision/safety, Sitting Upper Body Bathing: Supervision/ safety, Sitting Lower Body Bathing: Moderate assistance,  Sitting/lateral leans Upper Body Dressing : Minimal assistance, Sitting Lower Body Dressing: Maximal assistance, Sitting/lateral leans, Moderate assistance Toilet Transfer: +2 for physical assistance, +2 for safety/equipment, RW, Squat-pivot Toilet Transfer Details (indicate cue type and reason): bed to chair ( 2 person A )  General ADL Comments: Upon standing with walker pt not able to maintain upright standing. REturned to sitting and Aed ptto chair ( OT and CNA on each side)    Mobility  Overal bed mobility: Needs Assistance Bed Mobility: Sit to Supine Supine to sit: Mod assist Sit to supine: Min assist, HOB elevated General bed mobility comments: Pt up in chair upon PT arrival. Min assist for sit>supine for RLE management, assisting pt in scooting up in bed. Pt using trapeze bar well with bed mobility.    Transfers  Overall transfer level: Needs assistance Equipment used: Rolling walker (2 wheeled) Transfers: Sit to/from Stand, Google Transfers Sit to Stand: Mod assist Squat pivot transfers: Mod assist General transfer comment: Sit to stand from recliner x5 for LLE strengthening and standing balance. Mod assist for power up, steadying, and verbal cuing for hand placement upon rising. Pt tries to place both UEs on RW, pt instructed to push with at least 1 UE from recliner. Squat pivot x2, once from recliner to [facility], and once from [facility] to bed. Mod assist for lifting torso off surface, hand placement, guiding pt to surface.    Ambulation / Gait / Stairs / Wheelchair Mobility  Ambulation/Gait Ambulation/Gait assistance: (unable to attempt, pt unsteady with static standing )    Posture / Balance Balance Overall balance assessment: Needs assistance Sitting-balance support: Feet supported, No upper extremity supported Sitting balance-Leahy Scale: Good Standing balance support: Bilateral upper extremity supported Standing balance-Leahy Scale: Poor Standing balance comment:  unsteady in static standing, able to stand ~20 seconds before fatiguing/requiring seated rest.     Special needs/care consideration BiPAP/CPAP : no CPM : no Continuous Drip IV : 0.9% sodium chloride infusion Dialysis : no        Days : no Life Vest : no Oxygen on RA Special Bed : no Trach Size : no Wound Vac (area) yes      Location : RLE residual limb Skin : surgical incision to RLE                         Bowel mgmt: last BM: 4/27, continent Bladder mgmt: external catheter in place Diabetic mgmt: yes Behavioral consideration : no Chemo/radiation: no   Previous Home Environment (from acute therapy documentation) Living Arrangements: Children Available Help at Discharge: Family, Friend(s), Available 24 hours/day Type of Home: [place]: One level Home Access: Ramped entrance Bathroom Shower/Tub: Tub/shower unit, Multimedia programmer: Standard Home Care Services: No  Discharge Living Setting Plans for Discharge Living Setting: Patient's home, Lives with (comment)(son, daughter in law and 2 grandchildren (the kids have a babysitter during the day)) Type of Home at Discharge: House Discharge Home Layout: Two level, Able to live on main level with bedroom/bathroom Alternate Level Stairs-Rails: None(NA, able to live on first floor) Alternate Level Stairs-Number of Steps: NA, able to live on first floor Discharge Home Access: Stairs to enter, Ramped entrance Entrance Stairs-Rails: None Entrance Stairs-Number of Steps: 3 Discharge Bathroom Shower/Tub: Walk-in shower Discharge Bathroom Toilet: Standard Discharge Bathroom Accessibility: Yes How Accessible: Accessible via walker Does the patient have any problems obtaining your medications?: No  Social/Family/Support Systems Patient Roles: Other (Comment)(lives with son, daughter in law and grandchildren; worked full time at [employer]) Sport and exercise psychologist Information: son: Tommi Rumps [phone]) Anticipated  Caregiver: son in morning prior to him going to work, then evenings after work and at night Anticipated Ambulance person Information: see above Ability/Limitations of Caregiver: Min  A Caregiver Availability: Other (Comment)(when son not at work (9-5pm in work schedule) (intermittant assist) Discharge Plan Discussed with Primary Caregiver: Yes(son-family is present morning, evenings, and nights) Is Caregiver In Agreement with Plan?: Yes Does Caregiver/Family have Issues with Lodging/Transportation while Pt is in Rehab?: No  Goals/Additional Needs Patient/Family Goal for Rehab: PT/OT: Mod I; SLP: NA Expected length of stay: 7-10 days Cultural Considerations: NA Dietary Needs: carb modified, thin liquids; medium calorie level 1600-2000.  Equipment Needs: TBD Pt/Family Agrees to Admission and willing to participate: Yes Program Orientation Provided & Reviewed with Pt/Caregiver Including Roles  & Responsibilities: Yes(pt and son)  Barriers to Discharge: Lack of/limited family support, Weight bearing restrictions(son works during the day; limited assist if needed)  Decrease burden of Care through IP rehab admission: NA  Possible need for SNF placement upon discharge: Not anticipated; pt has good prognosis for further progress through CIR and has family who can assist at some points during the day.   Patient Condition: I have reviewed medical records from [facility], spoken with Dr. [name] Given, RN, and patient and son. I met with patient at the bedside for inpatient rehabilitation assessment.  Patient will benefit from ongoing PT and OT, can actively participate in 3 hours of therapy a day 5 days of the week, and can make measurable gains during the admission.  Patient will also benefit from the coordinated team approach during an Inpatient Acute Rehabilitation admission.  The patient will receive intensive therapy as well as Rehabilitation physician, nursing, social worker, and  care management interventions.  Due to safety, skin/wound care, disease management, medication administration, pain management and patient education the patient requires 24 hour a day rehabilitation nursing.  The patient is currently Mod A for transfers and Max A for LB ADLs.  Discharge setting and therapy post discharge at home with home health is anticipated.  Patient has agreed to participate in the Acute Inpatient Rehabilitation Program and will admit today.  Preadmission Screen Completed By:  Jhonnie Garner, 01/05/2019 2:39 PM ______________________________________________________________________   Discussed status with Dr. Posey Pronto on 01/05/19 at 2:38PM and received approval for admission today.  Admission Coordinator:  Jhonnie Garner, OT, time 2:38PM/Date 01/05/19   Assessment/Plan: Diagnosis: Right BKA  1. Does the need for close, 24 hr/day Medical supervision in concert with the patient's rehab needs make it unreasonable for this patient to be served in a less intensive setting? Yes  2. Co-Morbidities requiring supervision/potential complications: chronic kidney disease stage IIl,acute on chronic diastolic congestive heart failure,CAD with CABG October 2019, hypertension, intracerebral hemorrhage,hyperlipidemia,remote tobacco use, diabetes mellitus with peripheral vascular disease 3. Due to bladder management, bowel management, safety, skin/wound care, disease management, pain management and patient education, does the patient require 24 hr/day rehab nursing? Yes 4. Does the patient require coordinated care of a physician, rehab nurse, PT (1-2 hrs/day, 5 days/week) and OT (1-2 hrs/day, 5 days/week) to address physical and functional deficits in the context of the above medical diagnosis(es)? Yes Addressing deficits in the following areas: balance, endurance, locomotion, strength, transferring, bathing, dressing, toileting and psychosocial support 5. Can the patient actively participate in an  intensive therapy program of at least 3 hrs of therapy 5 days a week? Yes 6. The potential for patient to make measurable gains while on inpatient rehab is excellent 7. Anticipated functional outcomes upon discharge from inpatients are: supervision PT, supervision OT, n/a SLP 8. Estimated rehab length of stay to reach the above functional goals is: 9-14 days. 9. Anticipated D/C  setting: Home 10. Anticipated post D/C treatments: HH therapy and Home excercise program 11. Overall Rehab/Functional Prognosis: good  MD Signature: Delice Lesch, MD, ABPMR        Revision History    Date/Time User Provider Type Action  01/05/2019 3:03 PM Jamse Arn, MD Physician Sign  01/05/2019 2:41 PM Jhonnie Garner, OT Rehab Admission Coordinator Share  01/05/2019 2:41 PM Jhonnie Garner, [place] Rehab Admission Coordinator Share  View Details Report

## 2019-01-06 ENCOUNTER — Inpatient Hospital Stay (HOSPITAL_COMMUNITY): Payer: BLUE CROSS/BLUE SHIELD

## 2019-01-06 ENCOUNTER — Inpatient Hospital Stay (HOSPITAL_COMMUNITY): Payer: BLUE CROSS/BLUE SHIELD | Admitting: Physical Therapy

## 2019-01-06 DIAGNOSIS — I739 Peripheral vascular disease, unspecified: Secondary | ICD-10-CM

## 2019-01-06 DIAGNOSIS — R110 Nausea: Secondary | ICD-10-CM

## 2019-01-06 DIAGNOSIS — D62 Acute posthemorrhagic anemia: Secondary | ICD-10-CM

## 2019-01-06 LAB — CBC WITH DIFFERENTIAL/PLATELET
Abs Immature Granulocytes: 0.04 10*3/uL (ref 0.00–0.07)
Basophils Absolute: 0.1 10*3/uL (ref 0.0–0.1)
Basophils Relative: 1 %
Eosinophils Absolute: 0.4 10*3/uL (ref 0.0–0.5)
Eosinophils Relative: 4 %
HCT: 26.6 % — ABNORMAL LOW (ref 36.0–46.0)
Hemoglobin: 8.7 g/dL — ABNORMAL LOW (ref 12.0–15.0)
Immature Granulocytes: 0 %
Lymphocytes Relative: 15 %
Lymphs Abs: 1.5 10*3/uL (ref 0.7–4.0)
MCH: 26.9 pg (ref 26.0–34.0)
MCHC: 32.7 g/dL (ref 30.0–36.0)
MCV: 82.1 fL (ref 80.0–100.0)
Monocytes Absolute: 1.1 10*3/uL — ABNORMAL HIGH (ref 0.1–1.0)
Monocytes Relative: 11 %
Neutro Abs: 7 10*3/uL (ref 1.7–7.7)
Neutrophils Relative %: 69 %
Platelets: 566 10*3/uL — ABNORMAL HIGH (ref 150–400)
RBC: 3.24 MIL/uL — ABNORMAL LOW (ref 3.87–5.11)
RDW: 13.5 % (ref 11.5–15.5)
WBC: 10.1 10*3/uL (ref 4.0–10.5)
nRBC: 0 % (ref 0.0–0.2)

## 2019-01-06 LAB — COMPREHENSIVE METABOLIC PANEL
ALT: 10 U/L (ref 0–44)
AST: 23 U/L (ref 15–41)
Albumin: 1.8 g/dL — ABNORMAL LOW (ref 3.5–5.0)
Alkaline Phosphatase: 86 U/L (ref 38–126)
Anion gap: 9 (ref 5–15)
BUN: 34 mg/dL — ABNORMAL HIGH (ref 6–20)
CO2: 34 mmol/L — ABNORMAL HIGH (ref 22–32)
Calcium: 8.5 mg/dL — ABNORMAL LOW (ref 8.9–10.3)
Chloride: 93 mmol/L — ABNORMAL LOW (ref 98–111)
Creatinine, Ser: 1.29 mg/dL — ABNORMAL HIGH (ref 0.44–1.00)
GFR calc Af Amer: 54 mL/min — ABNORMAL LOW (ref >60)
GFR calc non Af Amer: 47 mL/min — ABNORMAL LOW (ref >60)
Glucose, Bld: 90 mg/dL (ref 70–99)
Potassium: 3.6 mmol/L (ref 3.5–5.1)
Sodium: 136 mmol/L (ref 135–145)
Total Bilirubin: 0.6 mg/dL (ref 0.3–1.2)
Total Protein: 6.6 g/dL (ref 6.5–8.1)

## 2019-01-06 LAB — GLUCOSE, CAPILLARY
Glucose-Capillary: 83 mg/dL (ref 70–99)
Glucose-Capillary: 81 mg/dL (ref 70–99)
Glucose-Capillary: 126 mg/dL — ABNORMAL HIGH (ref 70–99)
Glucose-Capillary: 122 mg/dL — ABNORMAL HIGH (ref 70–99)

## 2019-01-06 MED ORDER — PANTOPRAZOLE SODIUM 40 MG PO TBEC
40.0000 mg | DELAYED_RELEASE_TABLET | Freq: Every day | ORAL | Status: DC
  Administered 2019-01-06 – 2019-01-15 (×10): 40 mg via ORAL
  Filled 2019-01-06 (×10): qty 1

## 2019-01-06 MED ORDER — METOCLOPRAMIDE HCL 5 MG PO TABS
5.0000 mg | ORAL_TABLET | Freq: Three times a day (TID) | ORAL | Status: DC
  Administered 2019-01-06 – 2019-01-15 (×36): 5 mg via ORAL
  Filled 2019-01-06 (×38): qty 1

## 2019-01-06 NOTE — Evaluation (Signed)
Physical Therapy Assessment and Plan  Patient Details  Name: [name] MRN: [id number] Date of Birth: 1966-06-15  PT Diagnosis: Difficulty walking and Muscle weakness Rehab Potential: Good ELOS: 10-12 days   Today's Date: 01/06/2019 PT Individual Time: 0930-1030 PT Individual Time Calculation (min): 60 min    Problem List:  Patient Active Problem List   Diagnosis Date Noted  . Right below-knee amputee ([place]) 01/05/2019  . Unilateral complete BKA, right, initial encounter ([place])   . CKD (chronic kidney disease), stage III ([place])   . Hypothyroidism   . Essential hypertension   . Chronic diastolic congestive heart failure ([place])   . Coronary artery disease involving coronary bypass graft of native heart without angina pectoris   . Poorly controlled type 2 diabetes mellitus with peripheral neuropathy ([place])   . Acute on chronic anemia   . History of below knee amputation, right ([place]) 12/31/2018  . Gangrene of right foot ([place])   . Amputation of right great toe ([place]) 12/22/2018  . Abscess of great toe, right   . Diabetic infection of right foot ([place])   . Cellulitis of foot, right 12/11/2018  . S/P CABG x 3 06/28/2018  . Coronary artery disease involving native coronary artery of native heart with angina pectoris ([place]) 06/25/2018  . Type 2 diabetes mellitus with vascular disease ([place]) 06/24/2018  . Acute on chronic diastolic CHF (congestive heart failure) ([place]) 06/24/2018  . Non-ST elevation (NSTEMI) myocardial infarction ([place]) 06/24/2018  . Chest pain 06/23/2018  . Glaucoma 09/17/2017  . Diabetic retinopathy ([place]) 09/17/2017  . Otitis media 11/10/2016  . Decreased visual acuity 11/10/2016  . Spinal stenosis, lumbar region with neurogenic claudication 10/22/2016  . Other spondylosis with radiculopathy, lumbar region 10/13/2016  . Lipoma of abdominal wall 10/05/2016  . Acute osteomyelitis of toe, left ([place]) 09/05/2016  . Severe protein-calorie malnutrition ([place]) 09/04/2016  .  Anemia of chronic disease 09/04/2016  . Diabetic foot infection ([place]) 08/26/2016  . Diabetic polyneuropathy associated with type 2 diabetes mellitus ([place]) 05/07/2016  . Orthostatic hypotension 05/07/2016  . Cerebrovascular disease 04/27/2016  . ICH (intracerebral hemorrhage) ([place]) 03/30/2016  . Visit for preventive health examination 05/21/2015  . Breast cancer screening 05/21/2015  . Arm lesion 07/02/2014  . Pain in joint, ankle and foot 04/28/2014  . Pain in limb 01/13/2014  . Neuropathic pain of both legs 01/13/2014  . Hip pain 01/12/2014  . Allergic rhinitis 03/10/2013  . Back pain 03/08/2013  . Depression 12/18/2012  . PVD (peripheral vascular disease) ([place]) 01/21/2012  . Wound of left leg 12/22/2011  . Hyperlipidemia 12/06/2010  . Overweight(278.02) 12/06/2010  . RESTLESS LEG SYNDROME 10/25/2010  . Migraine without aura 10/07/2010  . Hereditary and idiopathic peripheral neuropathy 10/07/2010  . Essential hypertension, benign 10/07/2010  . DISTURBANCE OF SKIN SENSATION 10/07/2010  . HEART MURMUR, HX OF 10/07/2010  . NEPHROLITHIASIS, HX OF 10/07/2010    Past Medical History:  Past Medical History:  Diagnosis Date  . Acute on chronic diastolic CHF (congestive heart failure) ([place]) 06/24/2018  . Anxiety   . COMMON MIGRAINE 10/07/2010  . Decreased visual acuity 11/10/2016  . Depression 12/18/2012  . Diabetes mellitus type II, uncontrolled ([place]) 10/07/2010   Qualifier: Diagnosis of  By: Charlett Blake MD, [name]    . Diabetic foot infection ([place]) 08/26/2016  . Disturbance of skin sensation 10/07/2010  . Gangrene of right foot ([place])   . Heart murmur   . History of kidney stones    "years ago"  . Hyperlipidemia  12/06/2010  . Hypertension   . Lipoma of abdominal wall 10/05/2016  . Overweight(278.02) 12/06/2010  . PERIPHERAL NEUROPATHY, FEET 10/07/2010  . PVD (peripheral vascular disease) ([place]) 01/21/2012  . RESTLESS LEG SYNDROME 10/25/2010  . Stroke [facility]) 2014, 2017   most recently in  2/17 - intracerebral hemorrhage   Past Surgical History:  Past Surgical History:  Procedure Laterality Date  . AMPUTATION Right 12/13/2018   Procedure: AMPUTATION RIGHT GREAT TOE, LOCAL RELOCATION OF TISSUE FOR WOUND CLOSURE 9cm x 3cm, VAC APPLICATION;  Surgeon: [name], MD;  Location: [facility];  Service: Orthopedics;  Laterality: Right;  . AMPUTATION Right 12/31/2018   Procedure: RIGHT BELOW KNEE AMPUTATION;  Surgeon: [name], MD;  Location: [facility];  Service: Orthopedics;  Laterality: Right;  . AMPUTATION TOE Left 09/09/2016   Procedure: AMPUTATION OF LEFT GREAT TOE;  Surgeon: Milly Jakob, MD;  Location: [facility];  Service: Orthopedics;  Laterality: Left;  . CARDIAC CATHETERIZATION  06/24/2018  . CORONARY ARTERY BYPASS GRAFT N/A 06/28/2018   Procedure: CORONARY ARTERY BYPASS GRAFTING (CABG) times  four, using left internal mammary artery, endoscopically harvested right saphenous vein, and harvested left radial artery;  Surgeon: [name], MD;  Location: [facility];  Service: Open Heart Surgery;  Laterality: N/A;  . ENDOVEIN HARVEST OF GREATER SAPHENOUS VEIN Right 06/28/2018   Procedure: ENDOVEIN HARVEST OF GREATER SAPHENOUS VEIN;  Surgeon: [name], MD;  Location: [facility];  Service: Open Heart Surgery;  Laterality: Right;  . LEFT HEART CATH AND CORONARY ANGIOGRAPHY N/A 06/24/2018   Procedure: LEFT HEART CATH AND CORONARY ANGIOGRAPHY;  Surgeon: Troy Sine, MD;  Location: [facility];  Service: Cardiovascular;  Laterality: N/A;  . RADIAL ARTERY HARVEST Left 06/28/2018   Procedure: RADIAL ARTERY HARVEST;  Surgeon: [name], MD;  Location: [facility];  Service: Open Heart Surgery;  Laterality: Left;  . TEE WITHOUT CARDIOVERSION N/A 06/28/2018   Procedure: TRANSESOPHAGEAL ECHOCARDIOGRAM (TEE);  Surgeon: [name], MD;  Location: [facility];  Service: Open Heart Surgery;  Laterality: N/A;  . WISDOM TOOTH EXTRACTION       Assessment & Plan Clinical Impression: Patient is a 53 y.o. year old female with recent admission to the hospital on 12/31/2018 with right foot gangrene.  She recently had a right first ray amputation.  Noted wound dehiscence progressive cellulitis low-grade fever. Limb was not felt to be salvageable. Underwent right BKA on 12/31/2018 per Dr. [name] Given. Hospital course complicated by pain and hypokalemia.  Potassium supplement added. On 01/01/2019 rapid response called due to decreased responsiveness and desaturations after receiving oxycodone. Patient did receive Narcan with good results. All narcotics of since been discontinued.Hospital course further complicated by acute on chronic anemia, hemoglobin 10.9 on 12/31/2018. Therapy evaluations completed patient was admitted for a comprehensive rehabilitation program.  Patient transferred to CIR on 01/05/2019 .   Patient currently requires mod with mobility secondary to muscle weakness, decreased cardiorespiratoy endurance and decreased sitting balance, decreased standing balance, decreased postural control and decreased balance strategies.  Prior to hospitalization, patient was modified independent  with mobility and lived with Family in a House home.  Home access is  Ramped entrance.  Patient will benefit from skilled PT intervention to maximize safe functional mobility, minimize fall risk and decrease caregiver burden for planned discharge home with 24 hour supervision.  Anticipate patient will benefit from follow up [place] at discharge.  PT - End of Session Activity Tolerance: Tolerates 30+ min  activity with multiple rests Endurance Deficit: Yes PT Assessment Rehab Potential (ACUTE/IP ONLY): Good PT Barriers to Discharge: Decreased caregiver support;Wound Care PT Patient demonstrates impairments in the following area(s): Balance;Endurance;Motor;Pain;Sensory;Safety PT Transfers Functional Problem(s): Bed Mobility;Bed to Chair;Car;Furniture PT Locomotion  Functional Problem(s): Wheelchair Mobility PT Plan PT Intensity: Minimum of 1-2 x/day ,45 to 90 minutes PT Frequency: 5 out of 7 days PT Duration Estimated Length of Stay: 10-12 days PT Treatment/Interventions: Ambulation/gait training;Community reintegration;DME/adaptive equipment instruction;Neuromuscular re-education;UE/LE Strength taining/ROM;Wheelchair propulsion/positioning;Balance/vestibular training;Discharge planning;Pain management;Therapeutic Activities;UE/LE Coordination activities;Functional mobility training;Patient/family education;Splinting/orthotics;Therapeutic Exercise PT Transfers Anticipated Outcome(s): supervision PT Locomotion Anticipated Outcome(s): supervision w/c level  Skilled Therapeutic Intervention Pt participated in skilled PT eval and was educated on PT POC and goals. Pt performs bed mobility with min A, squat pivot transfers with mod A, cues for technique and UE placement.  W/c mobility with supervision and cuing to increase efficiency.  Discussed w/c positioning and importance of knee extension on residual limb to prepare for prosthetic.  Sit <> stand multiple attempts with mod A, pt limited by pain in heel on Lt LE.  Attempt stand pivot transfer with RW, pt unable due to Lt heel pain.  Sliding board transfer to simulated entry into large SUV, pt requires mod/max A.  Pt states she will try to find a regular sedan to return home in. Pt left in bed with needs at hand, alarm set.  PT Evaluation Precautions/Restrictions Precautions Precautions: Fall Precaution Comments: R BKA wound vac Required Braces or Orthoses: Other Brace Restrictions Weight Bearing Restrictions: Yes RLE Weight Bearing: Non weight bearing Pain Pain Assessment Pain Scale: 0-10 Pain Score: 0-No pain Faces Pain Scale: Hurts even more Pain Type: Acute pain Pain Location: Heel Pain Orientation: Left Pain Descriptors / Indicators: Aching Pain Onset: With Activity Pain Intervention(s):  Repositioned;Rest Home Living/Prior Functioning Home Living Available Help at Discharge: Family Type of Home: House Home Access: Ramped entrance Home Layout: One level Bathroom Shower/Tub: Tub/shower unit;Walk-in shower Bathroom Toilet: Standard  Lives With: Family Prior Function Level of Independence: Independent with basic ADLs;Independent with homemaking with ambulation Driving: Yes Comments: Using RW over past few weeks Vision/Perception  Perception Perception: Within Functional Limits Praxis Praxis: Intact  Cognition Overall Cognitive Status: Within Functional Limits for tasks assessed Arousal/Alertness: Awake/alert Orientation Level: Oriented X4 Awareness: Appears intact Problem Solving: Appears intact Safety/Judgment: Impaired Sensation Sensation Light Touch: Appears Intact Proprioception: Appears Intact Coordination Gross Motor Movements are Fluid and Coordinated: Yes Fine Motor Movements are Fluid and Coordinated: Yes Finger Nose Finger Test: WNL Motor  Motor Motor: Within Functional Limits Motor - Skilled Clinical Observations: generalized weakness  Mobility Bed Mobility Bed Mobility: Supine to Sit Supine to Sit: Contact Guard/Touching assist Transfers Transfers: Squat Pivot Transfers;Sit to Stand Sit to Stand: Moderate Assistance - Patient 50-74% Squat Pivot Transfers: Moderate Assistance - Patient 50-74% Locomotion  Gait Ambulation: No Gait Gait: No Stairs / Additional Locomotion Stairs: No Wheelchair Mobility Wheelchair Mobility: Yes Wheelchair Assistance: Chartered loss adjuster: Both upper extremities Wheelchair Parts Management: Needs assistance Distance: 50  Trunk/Postural Assessment  Cervical Assessment Cervical Assessment: (fwd head) Thoracic Assessment Thoracic Assessment: (rounded shoulders) Lumbar Assessment Lumbar Assessment: (posterior pelvic tilt) Postural Control Postural Control: Within Functional  Limits  Balance Balance Balance Assessed: Yes Dynamic Sitting Balance Dynamic Sitting - Balance Support: During functional activity;No upper extremity supported;Feet unsupported Dynamic Sitting - Level of Assistance: 5: Stand by assistance Dynamic Sitting - Balance Activities: Lateral lean/weight shifting Sitting balance - Comments: dressing Static Standing Balance Static Standing -  Comment/# of Minutes: min A Extremity Assessment  RUE Assessment RUE Assessment: Within Functional Limits LUE Assessment LUE Assessment: Exceptions to [employer] General Strength Comments: generalized weakness; occasional L shoulder pain with increased activity RLE Assessment General Strength Comments: decreased knee extension, grossly 3-/5 LLE Assessment General Strength Comments: grossly 3/5    Refer to Care Plan for Long Term Goals  Recommendations for other services: None   Discharge Criteria: Patient will be discharged from PT if patient refuses treatment 3 consecutive times without medical reason, if treatment goals not met, if there is a change in medical status, if patient makes no progress towards goals or if patient is discharged from hospital.  The above assessment, treatment plan, treatment alternatives and goals were discussed and mutually agreed upon: by patient  [facility] 01/06/2019, 10:32 AM

## 2019-01-06 NOTE — Evaluation (Addendum)
Occupational Therapy Assessment and Plan  Patient Details  Name: [name] MRN: [id number] Date of Birth: 12-26-1965  OT Diagnosis: abnormal posture, acute pain, lumbago (low back pain) and muscle weakness (generalized) Rehab Potential:   ELOS: 10-12   Today's Date: 01/06/2019 OT Individual Time: 5176-1607 OT Individual Time Calculation (min): 75 min     Problem List:  Patient Active Problem List   Diagnosis Date Noted  . Right below-knee amputee ([place]) 01/05/2019  . Unilateral complete BKA, right, initial encounter ([place])   . CKD (chronic kidney disease), stage III ([place])   . Hypothyroidism   . Essential hypertension   . Chronic diastolic congestive heart failure ([place])   . Coronary artery disease involving coronary bypass graft of native heart without angina pectoris   . Poorly controlled type 2 diabetes mellitus with peripheral neuropathy ([place])   . Acute on chronic anemia   . History of below knee amputation, right ([place]) 12/31/2018  . Gangrene of right foot ([place])   . Amputation of right great toe ([place]) 12/22/2018  . Abscess of great toe, right   . Diabetic infection of right foot ([place])   . Cellulitis of foot, right 12/11/2018  . S/P CABG x 3 06/28/2018  . Coronary artery disease involving native coronary artery of native heart with angina pectoris ([place]) 06/25/2018  . Type 2 diabetes mellitus with vascular disease ([place]) 06/24/2018  . Acute on chronic diastolic CHF (congestive heart failure) ([place]) 06/24/2018  . Non-ST elevation (NSTEMI) myocardial infarction ([place]) 06/24/2018  . Chest pain 06/23/2018  . Glaucoma 09/17/2017  . Diabetic retinopathy ([place]) 09/17/2017  . Otitis media 11/10/2016  . Decreased visual acuity 11/10/2016  . Spinal stenosis, lumbar region with neurogenic claudication 10/22/2016  . Other spondylosis with radiculopathy, lumbar region 10/13/2016  . Lipoma of abdominal wall 10/05/2016  . Acute osteomyelitis of toe, left ([place]) 09/05/2016  . Severe  protein-calorie malnutrition ([place]) 09/04/2016  . Anemia of chronic disease 09/04/2016  . Diabetic foot infection ([place]) 08/26/2016  . Diabetic polyneuropathy associated with type 2 diabetes mellitus ([place]) 05/07/2016  . Orthostatic hypotension 05/07/2016  . Cerebrovascular disease 04/27/2016  . ICH (intracerebral hemorrhage) ([place]) 03/30/2016  . Visit for preventive health examination 05/21/2015  . Breast cancer screening 05/21/2015  . Arm lesion 07/02/2014  . Pain in joint, ankle and foot 04/28/2014  . Pain in limb 01/13/2014  . Neuropathic pain of both legs 01/13/2014  . Hip pain 01/12/2014  . Allergic rhinitis 03/10/2013  . Back pain 03/08/2013  . Depression 12/18/2012  . PVD (peripheral vascular disease) ([place]) 01/21/2012  . Wound of left leg 12/22/2011  . Hyperlipidemia 12/06/2010  . Overweight(278.02) 12/06/2010  . RESTLESS LEG SYNDROME 10/25/2010  . Migraine without aura 10/07/2010  . Hereditary and idiopathic peripheral neuropathy 10/07/2010  . Essential hypertension, benign 10/07/2010  . DISTURBANCE OF SKIN SENSATION 10/07/2010  . HEART MURMUR, HX OF 10/07/2010  . NEPHROLITHIASIS, HX OF 10/07/2010    Past Medical History:  Past Medical History:  Diagnosis Date  . Acute on chronic diastolic CHF (congestive heart failure) ([place]) 06/24/2018  . Anxiety   . COMMON MIGRAINE 10/07/2010  . Decreased visual acuity 11/10/2016  . Depression 12/18/2012  . Diabetes mellitus type II, uncontrolled ([place]) 10/07/2010   Qualifier: Diagnosis of  By: Charlett Blake MD, [name]    . Diabetic foot infection ([place]) 08/26/2016  . Disturbance of skin sensation 10/07/2010  . Gangrene of right foot ([place])   . Heart murmur   . History of kidney stones    "  years ago"  . Hyperlipidemia 12/06/2010  . Hypertension   . Lipoma of abdominal wall 10/05/2016  . Overweight(278.02) 12/06/2010  . PERIPHERAL NEUROPATHY, FEET 10/07/2010  . PVD (peripheral vascular disease) ([place]) 01/21/2012  . RESTLESS LEG SYNDROME 10/25/2010   . Stroke [facility]) 2014, 2017   most recently in 2/17 - intracerebral hemorrhage   Past Surgical History:  Past Surgical History:  Procedure Laterality Date  . AMPUTATION Right 12/13/2018   Procedure: AMPUTATION RIGHT GREAT TOE, LOCAL RELOCATION OF TISSUE FOR WOUND CLOSURE 9cm x 3cm, VAC APPLICATION;  Surgeon: [name], MD;  Location: [facility];  Service: Orthopedics;  Laterality: Right;  . AMPUTATION Right 12/31/2018   Procedure: RIGHT BELOW KNEE AMPUTATION;  Surgeon: [name], MD;  Location: [facility];  Service: Orthopedics;  Laterality: Right;  . AMPUTATION TOE Left 09/09/2016   Procedure: AMPUTATION OF LEFT GREAT TOE;  Surgeon: Milly Jakob, MD;  Location: [facility];  Service: Orthopedics;  Laterality: Left;  . CARDIAC CATHETERIZATION  06/24/2018  . CORONARY ARTERY BYPASS GRAFT N/A 06/28/2018   Procedure: CORONARY ARTERY BYPASS GRAFTING (CABG) times  four, using left internal mammary artery, endoscopically harvested right saphenous vein, and harvested left radial artery;  Surgeon: [name], MD;  Location: [facility] [place];  Service: Open Heart Surgery;  Laterality: N/A;  . ENDOVEIN HARVEST OF GREATER SAPHENOUS VEIN Right 06/28/2018   Procedure: ENDOVEIN HARVEST OF GREATER SAPHENOUS VEIN;  Surgeon: [name], MD;  Location: [facility];  Service: Open Heart Surgery;  Laterality: Right;  . LEFT HEART CATH AND CORONARY ANGIOGRAPHY N/A 06/24/2018   Procedure: LEFT HEART CATH AND CORONARY ANGIOGRAPHY;  Surgeon: Troy Sine, MD;  Location: [facility];  Service: Cardiovascular;  Laterality: N/A;  . RADIAL ARTERY HARVEST Left 06/28/2018   Procedure: RADIAL ARTERY HARVEST;  Surgeon: [name], MD;  Location: [facility];  Service: Open Heart Surgery;  Laterality: Left;  . TEE WITHOUT CARDIOVERSION N/A 06/28/2018   Procedure: TRANSESOPHAGEAL ECHOCARDIOGRAM (TEE);  Surgeon: [name], MD;  Location: [facility];  Service: Open Heart Surgery;  Laterality:  N/A;  . WISDOM TOOTH EXTRACTION      Assessment & Plan Clinical Impression: HUMA IMHOFF is a 53 year old right-handed female history of chronic kidney disease stage IIl, acute on chronic diastolic congestive heart failure,CAD with CABG October 2019, hypertension, intracerebral hemorrhage February 2017,hyperlipidemia,remote tobacco use 13 years ago, diabetes mellitus with peripheral vascular disease.  Per chart review and patient, patient lives with her children. One level home with ramped entrance. Independent with assistive device. Presented 12/31/2018 with right foot gangrene.  She recently had a right first ray amputation.  Noted wound dehiscence progressive cellulitis low-grade fever. Limb was not felt to be salvageable. Underwent right BKA on 12/31/2018 per Dr. [name] Given. Hospital course complicated by pain and hypokalemia.  Potassium supplement added. On 01/01/2019 rapid response called due to decreased responsiveness and desaturations after receiving oxycodone. Patient did receive Narcan with good results. All narcotics of since been discontinued.Hospital course further complicated by acute on chronic anemia, hemoglobin 10.9 on 12/31/2018. Therapy evaluations completed patient was admitted for a comprehensive rehabilitation program  Patient currently requires mod with basic self-care skills secondary to muscle weakness, decreased cardiorespiratoy endurance and decreased sitting balance, decreased standing balance, decreased balance strategies and difficulty maintaining precautions.  Prior to hospitalization, patient could complete BADl/IADL with modified independent .  Patient will benefit from skilled intervention to decrease level of assist with basic self-care skills and  increase independence with basic self-care skills prior to discharge home with care partner.  Anticipate patient will require intermittent supervision and follow up home health.  OT - End of Session Activity Tolerance: Tolerates  30+ min activity with multiple rests Endurance Deficit: Yes OT Assessment Rehab Potential (ACUTE ONLY): Good OT Patient demonstrates impairments in the following area(s): Edema;Endurance;Pain;Safety;Skin Integrity;Sensory OT Basic ADL's Functional Problem(s): Grooming;Bathing;Dressing;Toileting OT Transfers Functional Problem(s): Toilet;Tub/Shower OT Plan OT Intensity: Minimum of 1-2 x/day, 45 to 90 minutes OT Frequency: 5 out of 7 days OT Duration/Estimated Length of Stay: 10-12 OT Treatment/Interventions: Balance/vestibular training;Community reintegration;Disease mangement/prevention;Neuromuscular re-education;Patient/family education;Self Care/advanced ADL retraining;Splinting/orthotics;Therapeutic Exercise;UE/LE Coordination activities;Wheelchair Advertising account planner;Discharge planning;Functional mobility training;Pain management;Psychosocial support;Skin care/wound managment;Therapeutic Activities;UE/LE Strength taining/ROM;Visual/perceptual remediation/compensation OT Self Feeding Anticipated Outcome(s): no goal OT Basic Self-Care Anticipated Outcome(s): S OT Toileting Anticipated Outcome(s): S OT Bathroom Transfers Anticipated Outcome(s): S OT Recommendation Patient destination: Home Follow Up Recommendations: Home health OT Equipment Recommended: 3 in 1 bedside comode;Tub/shower bench   Skilled Therapeutic Intervention 1:1. Pt edu on role/purpose of OT, CIR, ELOS and POC. Pt competes supine>sitting with supervision and VC for technique to use bed rails. Pt completes bathing seated EOB with S  With VC for lateral leans. Pt dons shirt with set up. Pt wants ot attempt toileting prior to donning pants. Pt completes squat pivo transfer with MOD A overall with VC for hand placement on bed and BSC rail. Pt with minmal success voiding despite increased time. Pt dons pants EOB with A to thread wound vac using lateral leans with CGA. Pt dependent for sock  and threading RLE. Exited session with pt seated in bed, exit alarm on and call light in reach OT Evaluation Precautions/Restrictions  Precautions Precautions: Fall Precaution Comments: R BKA wound vac Required Braces or Orthoses: Other Brace Restrictions Weight Bearing Restrictions: Yes RLE Weight Bearing: Non weight bearing General Chart Reviewed: Yes Family/Caregiver Present: No Vital Signs Therapy Vitals Temp: 97.9 F (36.6 C) Temp Source: Oral Pulse Rate: 69 Resp: 18 BP: (!) 173/85 Patient Position (if appropriate): Lying Oxygen Therapy SpO2: 100 % O2 Device: Room Air Pain Pain Assessment Pain Score: 4  Home Living/Prior Functioning Home Living Living Arrangements: Children Available Help at Discharge: Family, Friend(s), Available 24 hours/day Type of Home: House Home Access: Ramped entrance Home Layout: One level Bathroom Shower/Tub: Tub/shower unit, Multimedia programmer: Standard Prior Function Level of Independence: Independent with basic ADLs, Independent with homemaking with ambulation Comments: Using RW over past few weeks ADL ADL Upper Body Bathing: Supervision/safety Where Assessed-Upper Body Bathing: Edge of bed Lower Body Bathing: Supervision/safety Where Assessed-Lower Body Bathing: Edge of bed Upper Body Dressing: Supervision/safety Where Assessed-Upper Body Dressing: Edge of bed Toilet Transfer: Moderate assistance Toilet Transfer Method: Engineer, water: Bedside commode Vision Baseline Vision/History: Wears glasses Wears Glasses: Reading only Perception  Perception: Within Functional Limits Praxis Praxis: Intact Cognition Overall Cognitive Status: No family/caregiver present to determine baseline cognitive functioning Arousal/Alertness: Awake/alert Orientation Level: Person;Place;Situation Person: Oriented Place: Oriented Situation: Oriented Year: 2020 Month: April Day of Week: Correct Immediate  Memory Recall: Sock;Blue;Bed Memory Recall: Blue;Bed Memory Recall Blue: Without Cue Memory Recall Bed: Without Cue Awareness: Appears intact Problem Solving: Appears intact Safety/Judgment: Impaired Sensation Sensation Light Touch: Appears Intact Coordination Gross Motor Movements are Fluid and Coordinated: Yes Fine Motor Movements are Fluid and Coordinated: Yes Finger Nose Finger Test: WNL Motor  Motor Motor: Within Functional Limits Mobility    mod A squat pivot   Trunk/Postural Assessment  Cervical Assessment Cervical Assessment: Exceptions to WFL(head forward) Thoracic Assessment Thoracic Assessment: Exceptions to WFL(rounded hsoudler) Lumbar Assessment Lumbar Assessment: Exceptions to WFL(post pelvic tilt) Postural Control Postural Control: Within Functional Limits  Balance Balance Balance Assessed: Yes Dynamic Sitting Balance Dynamic Sitting - Balance Support: During functional activity;No upper extremity supported;Feet unsupported Dynamic Sitting - Level of Assistance: 5: Stand by assistance Dynamic Sitting - Balance Activities: Lateral lean/weight shifting Sitting balance - Comments: dressing Extremity/Trunk Assessment RUE Assessment RUE Assessment: Within Functional Limits LUE Assessment LUE Assessment: Exceptions to [facility] General Strength Comments: generalized weakness; occasional L shoulder pain with increased activity     Refer to Care Plan for Long Term Goals  Recommendations for other services: Therapeutic Recreation  Pet therapy and Stress management   Discharge Criteria: Patient will be discharged from OT if patient refuses treatment 3 consecutive times without medical reason, if treatment goals not met, if there is a change in medical status, if patient makes no progress towards goals or if patient is discharged from hospital.  The above assessment, treatment plan, treatment alternatives and goals were discussed and mutually agreed upon: by  patient  Tonny Branch 01/06/2019, 7:16 AM

## 2019-01-06 NOTE — Progress Notes (Signed)
PHYSICAL MEDICINE & REHABILITATION PROGRESS NOTE   Subjective/Complaints: Has had nausea since last evening. Dry heaving. Not sure why. States she's been moving bowels. Pain control fair. Doesn't think pain medication is causing nausea  ROS: Patient denies fever, rash, sore throat, blurred vision,   diarrhea, cough, shortness of breath or chest pain,  back pain, headache, or mood change.    Objective:   No results found. Recent Labs    01/06/19 0636  WBC 10.1  HGB 8.7*  HCT 26.6*  PLT 566*   Recent Labs    01/04/19 0844 01/06/19 0636  NA 135 136  K 3.4* 3.6  CL 93* 93*  CO2 28 34*  GLUCOSE 116* 90  BUN 40* 34*  CREATININE 2.13* 1.29*  CALCIUM 8.5* 8.5*    Intake/Output Summary (Last 24 hours) at 01/06/2019 0919 Last data filed at 01/05/2019 1845 Gross per 24 hour  Intake 222 ml  Output -  Net 222 ml     Physical Exam: Vital Signs Blood pressure (!) 157/83, pulse 61, temperature 97.9 F (36.6 C), temperature source Oral, resp. rate 18, height 5\' 3"  (1.6 m), weight 67.8 kg, last menstrual period 02/12/2011, SpO2 100 %. Constitutional: No distress, appears a little uncomfortable . Vital signs reviewed. HEENT: EOMI, oral membranes moist Neck: supple Cardiovascular: RRR without murmur. No JVD    Respiratory: CTA Bilaterally without wheezes or rales. Normal effort    GI: BS +, non-tender, non-distended   Musculoskeletal:     Comments: Right stump edema and tenderness with palpation.  Neurological: She is alert.  She follows full commands.  Motor: Bilateral upper extremities: 4+/5 proximal distal Left lower extremity: 4+/5 proximal to distal Right lower extremity: Hip flexion 4/5 (some pain inhibition) Sensation absent to touch left foot  Skin:  Amputation site is dressed with wound VAC in place -3"cm blister on left heel--foam dressings over top Psychiatric: quiet but pleasant     Assessment/Plan: 1. Functional deficits secondary to Right BKA  which require 3+ hours per day of interdisciplinary therapy in a comprehensive inpatient rehab setting.  Physiatrist is providing close team supervision and 24 hour management of active medical problems listed below.  Physiatrist and rehab team continue to assess barriers to discharge/monitor patient progress toward functional and medical goals  Care Tool:  Bathing              Bathing assist       Upper Body Dressing/Undressing Upper body dressing        Upper body assist      Lower Body Dressing/Undressing Lower body dressing            Lower body assist       Toileting Toileting    Toileting assist       Transfers Chair/bed transfer  Transfers assist           Locomotion Ambulation   Ambulation assist              Walk 10 feet activity   Assist           Walk 50 feet activity   Assist           Walk 150 feet activity   Assist           Walk 10 feet on uneven surface  activity   Assist           Wheelchair     Assist  Wheelchair 50 feet with 2 turns activity    Assist            Wheelchair 150 feet activity     Assist          Medical Problem List and Plan: 1.  Decreased functional mobility secondary to right BKA on 12/31/2018. Wound VAC as directed.            -Patient is beginning CIR therapies today including PT and OT  2.  Antithrombotics: -DVT/anticoagulation:  SCD left lower extremity             -antiplatelet therapy: aspirin 81 mg daily 3. Pain Management: fair control  -Neurontin 300 mg 3 times a day  -Robaxin as needed.patient  -no other when necessary narcotics at this time due to increased sensitivity to pain medication 4. Mood: Lexapro 10 mg daily             -antipsychotic agents: N/A 5. Neuropsych: This patient is not yet fully capable of making decisions on her own behalf. 6. Skin/Wound Care:  Routine skin checks.  DC wound VAC 1 week  postop. 7. Fluids/Electrolytes/Nutrition:  Routine ins and outs with follow-up BMP in the a.m. 8. Acute on chronic anemia. hgb 8.7 today, no signs of blood loss  -continue to monitor 9. Diabetes mellitus peripheral neuropathy. Hemoglobin A1c 10.5.NovoLog 4 units 3 times a day, Lantus insulin 40 units daily at bedtime.    -fair control at present. Follow for pattern 10. CAD with CABG. Continue aspirin. No chest pain or shortness of breath 11. Chronic diastolic congestive heart failure.  Monitor for signs and symptoms of fluid overload.  -check weights Filed Weights   01/05/19 1703  Weight: 67.8 kg    12. Hypertension. Imdur 15 mg daily, Norvasc 5 mg daily.  Monitor with increased mobility. 13. Hyperlipidemia. Lipitor 14. Hypothyroidism. Synthroid 15.Chronic kidney disease. Creatinine baseline 1.32-2.04.----1.29 4/30 16. Constipation. Laxative assistance 17. Nausea: unclear etiology,l  Moving bowels (had bm last night)  -no obvious pharmacological sources  -will schedule reglan and protonix  -continue zofran prn  -supportive care    LOS: 1 days A FACE TO FACE EVALUATION WAS PERFORMED  Meredith Staggers 01/06/2019, 9:19 AM

## 2019-01-06 NOTE — Progress Notes (Signed)
Occupational Therapy Session Note  Patient Details  Name: [name] MRN: [id number] Date of Birth: August 23, 1966  Today's Date: 01/06/2019 OT Individual Time: 2633-3545 OT Individual Time Calculation (min): 59 min    Short Term Goals: Week 1:  OT Short Term Goal 1 (Week 1): Pt will transfer to [facility] wiht LRAD and MIN A OT Short Term Goal 2 (Week 1): Pt will thread BLE into pants with AE PRN OT Short Term Goal 3 (Week 1): Pt will complete lateral leans wiht S on commode for 1/3 steps of toileting OT Short Term Goal 4 (Week 1): Pt will advance pants past hips wiht S   Skilled Therapeutic Interventions/Progress Updates:  1;1.Pt received in bed with 4/10 pain in heel. Pt completes lateral scoot transfer with MOD A to R. Pt completes w/c mobility to tx gym with 3 rest breaks d/t decreased endurance. Pt completes seated ball toss 3x30 in w/c (chest, bounce and overhead pass) with supervision, 1 # wrist weights and demo cuing to improve BUE/tricep strength required for BADLs and transfer. Pt instructed in w/c push ups however unable to tolerate arms on arm rests (too high) d/t shoulder pain. Pt completes 2x15 dowel rod 3# HEP for global strengthening of UB required for BADL s and transfers. PT completes lateral scoot transfer back to bed using red rail with MOD A. Exited session with pt seated in bed, exit alarm on and call light in reach.  Therapy Documentation Precautions:  Precautions Precautions: Fall Precaution Comments: R BKA wound vac Required Braces or Orthoses: Other Brace Restrictions Weight Bearing Restrictions: Yes RLE Weight Bearing: Non weight bearing General:   Vital Signs:  Pain:   ADL: ADL Upper Body Bathing: Supervision/safety Where Assessed-Upper Body Bathing: Edge of bed Lower Body Bathing: Supervision/safety Where Assessed-Lower Body Bathing: Edge of bed Upper Body Dressing: Supervision/safety Where Assessed-Upper Body Dressing: Edge of bed Toilet Transfer:  Moderate assistance Toilet Transfer Method: Engineer, water: Art gallery manager    Praxis Praxis: Intact Exercises:   Other Treatments:     Therapy/Group: Individual Therapy  Tonny Branch 01/06/2019, 2:44 PM

## 2019-01-06 NOTE — Progress Notes (Signed)
Inpatient Rehabilitation  Patient information reviewed and entered into eRehab system by [name] M. Bowie, M.A., CCC/SLP, PPS Coordinator.  Information including medical coding, functional ability and quality indicators will be reviewed and updated through discharge.    

## 2019-01-07 ENCOUNTER — Inpatient Hospital Stay (HOSPITAL_COMMUNITY): Payer: BLUE CROSS/BLUE SHIELD

## 2019-01-07 ENCOUNTER — Inpatient Hospital Stay (HOSPITAL_COMMUNITY): Payer: BLUE CROSS/BLUE SHIELD | Admitting: Physical Therapy

## 2019-01-07 LAB — GLUCOSE, CAPILLARY
Glucose-Capillary: 53 mg/dL — ABNORMAL LOW (ref 70–99)
Glucose-Capillary: 148 mg/dL — ABNORMAL HIGH (ref 70–99)
Glucose-Capillary: 50 mg/dL — ABNORMAL LOW (ref 70–99)
Glucose-Capillary: 96 mg/dL (ref 70–99)
Glucose-Capillary: 173 mg/dL — ABNORMAL HIGH (ref 70–99)

## 2019-01-07 MED ORDER — INSULIN GLARGINE 100 UNIT/ML ~~LOC~~ SOLN
35.0000 [IU] | Freq: Every day | SUBCUTANEOUS | Status: DC
  Administered 2019-01-07 – 2019-01-08 (×2): 35 [IU] via SUBCUTANEOUS
  Filled 2019-01-07 (×3): qty 0.35

## 2019-01-07 NOTE — Progress Notes (Addendum)
[place] PHYSICAL MEDICINE & REHABILITATION PROGRESS NOTE   Subjective/Complaints: Nausea better today although she stated "I usually am nauseas". Pain under fair control. Low cbg this mornin  ROS: Patient denies fever, rash, sore throat, blurred vision, nausea, vomiting, diarrhea, cough, shortness of breath or chest pain,   headache, or mood change.   Objective:   No results found. Recent Labs    01/06/19 0636  WBC 10.1  HGB 8.7*  HCT 26.6*  PLT 566*   Recent Labs    01/06/19 0636  NA 136  K 3.6  CL 93*  CO2 34*  GLUCOSE 90  BUN 34*  CREATININE 1.29*  CALCIUM 8.5*    Intake/Output Summary (Last 24 hours) at 01/07/2019 1015 Last data filed at 01/06/2019 1300 Gross per 24 hour  Intake 200 ml  Output 300 ml  Net -100 ml     Physical Exam: Vital Signs Blood pressure (!) 176/80, pulse 60, temperature 97.7 F (36.5 C), temperature source Oral, resp. rate 18, height 5\' 3"  (1.6 m), weight 67.8 kg, last menstrual period 02/12/2011, SpO2 99 %. Constitutional: No distress . Vital signs reviewed. HEENT: EOMI, oral membranes moist Neck: supple Cardiovascular: RRR without murmur. No JVD    Respiratory: CTA Bilaterally without wheezes or rales. Normal effort    GI: BS +, non-tender, non-distended  Musculoskeletal:     Comments: Right stump edema and tenderness with palpation.  Neurological: She is alert.  She follows full commands.  Motor: Bilateral upper extremities: 4+/5 proximal distal Left lower extremity: 4+/5 proximal to distal Right lower extremity: Hip flexion 4/5 (some pain inhibition) Sensation absent to touch left foot  Skin:  Amputation site is dressed with wound VAC in place -3"cm blister on left heel--no change--foam dressings in place Psychiatric:pleasant     Assessment/Plan: 1. Functional deficits secondary to Right BKA which require 3+ hours per day of interdisciplinary therapy in a comprehensive inpatient rehab setting.  Physiatrist is providing  close team supervision and 24 hour management of active medical problems listed below.  Physiatrist and rehab team continue to assess barriers to discharge/monitor patient progress toward functional and medical goals  Care Tool:  Bathing    Body parts bathed by patient: Right arm, Left arm, Chest, Abdomen, Front perineal area, Buttocks, Right upper leg, Left upper leg, Face   Body parts bathed by helper: Left lower leg Body parts n/a: Right lower leg   Bathing assist Assist Level: Minimal Assistance - Patient > 75%     Upper Body Dressing/Undressing Upper body dressing   What is the patient wearing?: Pull over shirt    Upper body assist Assist Level: Supervision/Verbal cueing    Lower Body Dressing/Undressing Lower body dressing      What is the patient wearing?: Underwear/pull up, Pants     Lower body assist Assist for lower body dressing: Maximal Assistance - Patient 25 - 49%     Toileting Toileting    Toileting assist Assist for toileting: Maximal Assistance - Patient 25 - 49%     Transfers Chair/bed transfer  Transfers assist     Chair/bed transfer assist level: Moderate Assistance - Patient 50 - 74%     Locomotion Ambulation   Ambulation assist   Ambulation activity did not occur: Safety/medical concerns(blister on Lt heel, Rt BKA)          Walk 10 feet activity   Assist           Walk 50 feet activity   Assist  Walk 150 feet activity   Assist           Walk 10 feet on uneven surface  activity   Assist           Wheelchair     Assist Will patient use wheelchair at discharge?: Yes Type of Wheelchair: Manual    Wheelchair assist level: Supervision/Verbal cueing Max wheelchair distance: 50    Wheelchair 50 feet with 2 turns activity    Assist        Assist Level: Supervision/Verbal cueing   Wheelchair 150 feet activity     Assist Wheelchair 150 feet activity did not occur:  Safety/medical concerns        Medical Problem List and Plan: 1.  Decreased functional mobility secondary to right BKA on 12/31/2018. Wound VAC as directed.           -Continue CIR therapies including PT, OT  2.  Antithrombotics: -DVT/anticoagulation:  SCD left lower extremity             -antiplatelet therapy: aspirin 81 mg daily 3. Pain Management: fair control  -Neurontin 300 mg 3 times a day  -Robaxin as needed.patient  -no   narcotics at this time due to increased sensitivity to pain medication 4. Mood: Lexapro 10 mg daily             -antipsychotic agents: N/A 5. Neuropsych: This patient is not yet fully capable of making decisions on her own behalf. 6. Skin/Wound Care:  Routine skin checks.  DC wound VAC 1 week postop.  -order prevalon boot for further protection of left heel  -foam dressing for heel sore 7. Fluids/Electrolytes/Nutrition:  Routine ins and outs with follow-up BMP in the a.m. 8. Acute on chronic anemia. hgb 8.7 today, no signs of blood loss  -continue to monitor 9. Diabetes mellitus peripheral neuropathy. Hemoglobin A1c 10.5.  -NovoLog 4 units 3 times a day, Lantus insulin 40 units daily at bedtime.    -CBG's low in AM  -will reduce lantus to 35u QHS  -consider AM lantus dosing 10. CAD with CABG. Continue aspirin. No chest pain or shortness of breath 11. Chronic diastolic congestive heart failure.  Monitor for signs and symptoms of fluid overload.  -need  weights Filed Weights   01/05/19 1703  Weight: 67.8 kg    12. Hypertension. Imdur 15 mg daily, Norvasc 5 mg daily.  Monitor with increased mobility. 13. Hyperlipidemia. Lipitor 14. Hypothyroidism. Synthroid 15.Chronic kidney disease. Creatinine baseline 1.32-2.04.----1.29 4/30 16. Constipation. Laxative assistance 17. Nausea: unclear etiology, states she has it at baseline  - Moving bowels    -no obvious pharmacological sources  -perhaps a little better with scheduled reglan and protonix  -continue  zofran prn  -supportive care    LOS: 2 days A FACE TO [place] 01/07/2019, 10:15 AM

## 2019-01-07 NOTE — Progress Notes (Signed)
Physical Therapy Session Note  Patient Details  Name: [name] MRN: [id number] Date of Birth: 09/30/1965  Today's Date: 01/07/2019 PT Individual Time: 0935-1005 PT Individual Time Calculation (min): 30 min   Short Term Goals: Week 1:  PT Short Term Goal 1 (Week 1): pt will perform functional transfers with min A PT Short Term Goal 2 (Week 1): pt will perform w/c parts management with supervision  Skilled Therapeutic Interventions/Progress Updates:     Patient in w/c with NT in room upon PT arrival. Patient alert and agreeable to PT session.  Therapeutic Activity: Bed Mobility: Patient performed sit to supine with supervision and rolling prone from/to supine with CGA for safety. Provided verbal cues for rolling technique and non weight bearing through L heel. Transfers: Patient performed squat pivot transfer from the w/c to the bed x1 with min A with toe weight bearing on L foot to protect wound on her heel. Provided verbal cues for hand placement and technique.  Wheelchair Mobility:  Patient propelled wheelchair within room for set up for w/c to bed transfer with supervision. Provided verbal cues for w/c set up for transfer and removal of leg rest and residual limb pad.  Therapeutic Exercise: Patient performed the following exercises with verbal and tactile cues for proper technique. -B quad sets x10 -B glut sets x10 -R SLR x10 -Prone lying for 2 minutes for hip flexor stretch and knee extension  Patient in bed at end of session with breaks locked, bed alarm set, and all needs within reach.    Therapy Documentation Precautions:  Precautions Precautions: Fall Precaution Comments: R BKA wound vac Required Braces or Orthoses: Other Brace Restrictions Weight Bearing Restrictions: No RLE Weight Bearing: Non weight bearing General: PT Amount of Missed Time (min): 45 Minutes PT Missed Treatment Reason: Pain Pain: Pain Assessment Pain Score: 4 Pain Location: R leg and L  heel Pain type: surgical pain, acute pain Pain descriptors: sore; sharp Pain interventions: reposition;distraction   Therapy/Group: Individual Therapy  Cherie L Grunenberg PT, DPT  01/07/2019, 1:13 PM

## 2019-01-07 NOTE — Progress Notes (Signed)
This nurse was notified of the patients CBG of 53. Pt was given a breakfast tray, ate 75% of the tray. This nurse reported the findings to the day shift nurse. Will continue to monitor.

## 2019-01-07 NOTE — Progress Notes (Signed)
Occupational Therapy Session Note  Patient Details  Name: [name] MRN: [id number] Date of Birth: 1965/12/17  Today's Date: 01/07/2019 OT Individual Time: 0702-0813 OT Individual Time Calculation (min): 71 min    Short Term Goals: Week 1:  OT Short Term Goal 1 (Week 1): Pt will transfer to [facility] wiht LRAD and MIN A OT Short Term Goal 2 (Week 1): Pt will thread BLE into pants with AE PRN OT Short Term Goal 3 (Week 1): Pt will complete lateral leans wiht S on commode for 1/3 steps of toileting OT Short Term Goal 4 (Week 1): Pt will advance pants past hips wiht S   Skilled Therapeutic Interventions/Progress Updates:    1:1. Pt received in bed agreeable to OT with heel pain (unrated). Pt reports needing to toilet. MOD A squat pivot transfer to [facility] and w/c. Pt able to complete lateral elans on toilet without A for clothing management prior to toileting and MOD A for after toileting to get pants back up hips. Pt washes hands at sink iwht set up seated. Pt completes lateral scoot transfer to EOB from w/c with MIN A with VC for more power up through BUE to clear buttock to decrease sheering forces. Pt completes lateral leans in B directions to obtain connect 4 pieces, work on core strengthening and dynamic reaching in prep for clothing management. Pt educated througout session on limb guard donning/doffing, prone positioning, and R hamstring stretches to decrease risk of contracture in prep for prosthesis. Pt lies in prone for 10 min to strethc out hip flexors with min A to enter/exit position. Pt completes lateral scoot transfers as stated above to return to bed. Exited session with pt seated in bed, exit alarm on and call light in reach  Therapy Documentation Precautions:  Precautions Precautions: Fall Precaution Comments: R BKA wound vac Required Braces or Orthoses: Other Brace Restrictions Weight Bearing Restrictions: Yes RLE Weight Bearing: Non weight bearing General:   Vital  Signs: Therapy Vitals Temp: 97.7 F (36.5 C) Temp Source: Oral Pulse Rate: 60 Resp: 18 BP: (!) 176/80 Patient Position (if appropriate): Lying Oxygen Therapy SpO2: 99 % O2 Device: Room Air Pain: Pain Assessment Pain Scale: 0-10 Pain Score: 0-No pain(pt stated no pain, only general discomfort) ADL: ADL Upper Body Bathing: Supervision/safety Where Assessed-Upper Body Bathing: Edge of bed Lower Body Bathing: Supervision/safety Where Assessed-Lower Body Bathing: Edge of bed Upper Body Dressing: Supervision/safety Where Assessed-Upper Body Dressing: Edge of bed Toilet Transfer: Moderate assistance Toilet Transfer Method: Engineer, water: Art gallery manager    Praxis   Exercises:   Other Treatments:     Therapy/Group: Individual Therapy  Tonny Branch 01/07/2019, 7:12 AM

## 2019-01-07 NOTE — IPOC Note (Signed)
Overall Plan of Care [facility]) Patient Details Name: [name] MRN: [id number] DOB: 10-12-1965  Admitting Diagnosis: <principal problem not specified>  Hospital Problems: Active Problems:   Right below-knee amputee [facility])     Functional Problem List: Nursing    PT Balance, Endurance, Motor, Pain, Sensory, Safety  OT Edema, Endurance, Pain, Safety, Skin Integrity, Sensory  SLP    TR         Basic ADL's: OT Grooming, Bathing, Dressing, Toileting     Advanced  ADL's: OT       Transfers: PT Bed Mobility, Bed to Chair, Car, Manufacturing systems engineer, Metallurgist: PT Wheelchair Mobility     Additional Impairments: OT    SLP        TR      Anticipated Outcomes Item Anticipated Outcome  Self Feeding no goal  Swallowing      Basic self-care  S  Toileting  S   Bathroom Transfers S  Bowel/Bladder     Transfers  supervision  Locomotion  supervision w/c level  Communication     Cognition     Pain     Safety/Judgment      Therapy Plan: PT Intensity: Minimum of 1-2 x/day ,45 to 90 minutes PT Frequency: 5 out of 7 days PT Duration Estimated Length of Stay: 10-12 days OT Intensity: Minimum of 1-2 x/day, 45 to 90 minutes OT Frequency: 5 out of 7 days OT Duration/Estimated Length of Stay: 10-12     Due to the current state of emergency, patients may not be receiving their 3-hours of Medicare-mandated therapy.   Team Interventions: Nursing Interventions    PT interventions Ambulation/gait training, Community reintegration, DME/adaptive equipment instruction, Neuromuscular re-education, UE/LE Strength taining/ROM, Wheelchair propulsion/positioning, Training and development officer, Discharge planning, Pain management, Therapeutic Activities, UE/LE Coordination activities, Functional mobility training, Patient/family education, Splinting/orthotics, Therapeutic Exercise  OT Interventions Training and development officer, Community reintegration, Disease  mangement/prevention, Neuromuscular re-education, Patient/family education, Self Care/advanced ADL retraining, Splinting/orthotics, Therapeutic Exercise, UE/LE Coordination activities, Wheelchair propulsion/positioning, DME/adaptive equipment instruction, Discharge planning, Functional mobility training, Pain management, Psychosocial support, Skin care/wound managment, Therapeutic Activities, UE/LE Strength taining/ROM, Visual/perceptual remediation/compensation  SLP Interventions    TR Interventions    SW/CM Interventions Discharge Planning, Psychosocial Support, Patient/Family Education   Barriers to Discharge MD  Medical stability  Nursing      PT Decreased caregiver support, Wound Care    OT      SLP      SW       Team Discharge Planning: Destination: PT-  ,OT- Home , SLP-  Projected Follow-up: PT- , OT-  Home health OT, SLP-  Projected Equipment Needs: PT- , OT- 3 in 1 bedside comode, Tub/shower bench, SLP-  Equipment Details: PT- , OT-  Patient/family involved in discharge planning: PT-  ,  OT-Patient, SLP-   MD ELOS: 10-12 days Medical Rehab Prognosis:  Excellent Assessment: The patient has been admitted for CIR therapies with the diagnosis of debility after. The team will be addressing functional mobility, strength, stamina, balance, safety, adaptive techniques and equipment, self-care, bowel and bladder mgt, patient and caregiver education, pre-prosthetic education, pain control, community reentry. Goals have been set at supervision for mobility and self-care.   Due to the current state of emergency, patients may not be receiving their 3 hours per day of Medicare-mandated therapy.    Meredith Staggers, MD, FAAPMR      See Team Conference Notes for weekly updates to the plan of care

## 2019-01-07 NOTE — Progress Notes (Signed)
Orthopedic Tech Progress Note Patient Details:  [name] October 07, 1965 [id number]  [name], female   DOB: 1965/09/22, 53 y.o.   MRN: [id number]   [name] 01/07/2019, 4:58 PMCalled [employer] for left Prafo boot with sole.

## 2019-01-07 NOTE — Progress Notes (Signed)
Social Work  Social Work Assessment and Plan  Patient Details  Name: [name] MRN: [id number] Date of Birth: 08-03-1966  Today's Date: 01/07/2019  Problem List:  Patient Active Problem List   Diagnosis Date Noted  . Hypoglycemia   . Diabetes mellitus type 2 in nonobese (HCC)   . Labile blood pressure   . Labile blood glucose   . Phantom limb pain ([place])   . Right below-knee amputee ([place]) 01/05/2019  . Unilateral complete BKA, right, initial encounter ([place])   . CKD (chronic kidney disease), stage III ([place])   . Hypothyroidism   . Essential hypertension   . Chronic diastolic congestive heart failure ([place])   . Coronary artery disease involving coronary bypass graft of native heart without angina pectoris   . Poorly controlled type 2 diabetes mellitus with peripheral neuropathy ([place])   . Acute on chronic anemia   . History of below knee amputation, right ([place]) 12/31/2018  . Gangrene of right foot ([place])   . Amputation of right great toe ([place]) 12/22/2018  . Abscess of great toe, right   . Diabetic infection of right foot ([place])   . Cellulitis of foot, right 12/11/2018  . S/P CABG x 3 06/28/2018  . Coronary artery disease involving native coronary artery of native heart with angina pectoris ([place]) 06/25/2018  . Type 2 diabetes mellitus with vascular disease ([place]) 06/24/2018  . Acute on chronic diastolic CHF (congestive heart failure) ([place]) 06/24/2018  . Non-ST elevation (NSTEMI) myocardial infarction ([place]) 06/24/2018  . Chest pain 06/23/2018  . Glaucoma 09/17/2017  . Diabetic retinopathy ([place]) 09/17/2017  . Otitis media 11/10/2016  . Decreased visual acuity 11/10/2016  . Spinal stenosis, lumbar region with neurogenic claudication 10/22/2016  . Other spondylosis with radiculopathy, lumbar region 10/13/2016  . Lipoma of abdominal wall 10/05/2016  . Acute osteomyelitis of toe, left ([place]) 09/05/2016  . Severe protein-calorie malnutrition ([place]) 09/04/2016  . Anemia of chronic  disease 09/04/2016  . Diabetic foot infection ([place]) 08/26/2016  . Diabetic polyneuropathy associated with type 2 diabetes mellitus ([place]) 05/07/2016  . Orthostatic hypotension 05/07/2016  . Cerebrovascular disease 04/27/2016  . ICH (intracerebral hemorrhage) ([place]) 03/30/2016  . Visit for preventive health examination 05/21/2015  . Breast cancer screening 05/21/2015  . Arm lesion 07/02/2014  . Pain in joint, ankle and foot 04/28/2014  . Pain in limb 01/13/2014  . Neuropathic pain of both legs 01/13/2014  . Hip pain 01/12/2014  . Allergic rhinitis 03/10/2013  . Back pain 03/08/2013  . Depression 12/18/2012  . PVD (peripheral vascular disease) ([place]) 01/21/2012  . Wound of left leg 12/22/2011  . Hyperlipidemia 12/06/2010  . Overweight(278.02) 12/06/2010  . RESTLESS LEG SYNDROME 10/25/2010  . Migraine without aura 10/07/2010  . Hereditary and idiopathic peripheral neuropathy 10/07/2010  . Essential hypertension, benign 10/07/2010  . DISTURBANCE OF SKIN SENSATION 10/07/2010  . HEART MURMUR, HX OF 10/07/2010  . NEPHROLITHIASIS, HX OF 10/07/2010   Past Medical History:  Past Medical History:  Diagnosis Date  . Acute on chronic diastolic CHF (congestive heart failure) ([place]) 06/24/2018  . Anxiety   . COMMON MIGRAINE 10/07/2010  . Decreased visual acuity 11/10/2016  . Depression 12/18/2012  . Diabetes mellitus type II, uncontrolled ([place]) 10/07/2010   Qualifier: Diagnosis of  By: Charlett Blake MD, [name]    . Diabetic foot infection ([place]) 08/26/2016  . Disturbance of skin sensation 10/07/2010  . Gangrene of right foot ([place])   . Heart murmur   . History of kidney stones    "  years ago"  . Hyperlipidemia 12/06/2010  . Hypertension   . Lipoma of abdominal wall 10/05/2016  . Overweight(278.02) 12/06/2010  . PERIPHERAL NEUROPATHY, FEET 10/07/2010  . PVD (peripheral vascular disease) ([place]) 01/21/2012  . RESTLESS LEG SYNDROME 10/25/2010  . Stroke [facility]) 2014, 2017   most recently in 2/17 - intracerebral  hemorrhage   Past Surgical History:  Past Surgical History:  Procedure Laterality Date  . AMPUTATION Right 12/13/2018   Procedure: AMPUTATION RIGHT GREAT TOE, LOCAL RELOCATION OF TISSUE FOR WOUND CLOSURE 9cm x 3cm, VAC APPLICATION;  Surgeon: [name], MD;  Location: [place];  Service: Orthopedics;  Laterality: Right;  . AMPUTATION Right 12/31/2018   Procedure: RIGHT BELOW KNEE AMPUTATION;  Surgeon: [name], MD;  Location: [place];  Service: Orthopedics;  Laterality: Right;  . AMPUTATION TOE Left 09/09/2016   Procedure: AMPUTATION OF LEFT GREAT TOE;  Surgeon: Milly Jakob, MD;  Location: [place];  Service: Orthopedics;  Laterality: Left;  . CARDIAC CATHETERIZATION  06/24/2018  . CORONARY ARTERY BYPASS GRAFT N/A 06/28/2018   Procedure: CORONARY ARTERY BYPASS GRAFTING (CABG) times  four, using left internal mammary artery, endoscopically harvested right saphenous vein, and harvested left radial artery;  Surgeon: [name], MD;  Location: [place];  Service: Open Heart Surgery;  Laterality: N/A;  . ENDOVEIN HARVEST OF GREATER SAPHENOUS VEIN Right 06/28/2018   Procedure: ENDOVEIN HARVEST OF GREATER SAPHENOUS VEIN;  Surgeon: [name], MD;  Location: [place];  Service: Open Heart Surgery;  Laterality: Right;  . LEFT HEART CATH AND CORONARY ANGIOGRAPHY N/A 06/24/2018   Procedure: LEFT HEART CATH AND CORONARY ANGIOGRAPHY;  Surgeon: Troy Sine, MD;  Location: [place] [facility];  Service: Cardiovascular;  Laterality: N/A;  . RADIAL ARTERY HARVEST Left 06/28/2018   Procedure: RADIAL ARTERY HARVEST;  Surgeon: [name], MD;  Location: [place];  Service: Open Heart Surgery;  Laterality: Left;  . TEE WITHOUT CARDIOVERSION N/A 06/28/2018   Procedure: TRANSESOPHAGEAL ECHOCARDIOGRAM (TEE);  Surgeon: [name], MD;  Location: [place];  Service: Open Heart Surgery;  Laterality: N/A;  . WISDOM TOOTH EXTRACTION     Social History:  reports that  she quit smoking about 13 years ago. She smoked 0.50 packs per day. She has never used smokeless tobacco. She reports previous drug use. She reports that she does not drink alcohol.  Family / Support Systems Marital Status: Widow/Widower How Long?: Aug 2019 Patient Roles: Parent Spouse/Significant Other: son, [name] (and girlfriend, Theadora Rama) - living with pt - @ [phone] Anticipated Caregiver: Son in evenings and morning prior to him going to work. Ability/Limitations of Caregiver: Min A Caregiver Availability: Intermittent Family Dynamics: Pt describes son and his gf as very supportive, however, they are both working and can only provide intermittent support.  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  She does have STD coverage. Legal History/Current Legal Issues: None Guardian/Conservator: None - per MD, pt is not yet fully capable of making decisions on her own behalf.   Abuse/Neglect Abuse/Neglect Assessment Can Be Completed: Yes Physical Abuse: Denies Verbal Abuse: Denies Sexual Abuse: Denies Exploitation of patient/patient's resources: Denies Self-Neglect: Denies  Emotional Status Pt's affect, behavior and adjustment status: Pt pleasant, able to complete assessment interview without difficulty.  She speaks only briefly to her husband's death.  Regarding BKA, she states, "I'm absorbing it all... wouldn't say I was dealing with it yet...just taking it all in... I'm having to learn how to go to the bathroom again. That's new!"  Would benefit from neuropsychology support - will refer. Recent Psychosocial Issues: Husband died 30-May-2018 due to "tumor". Psychiatric History: Pt denies Substance Abuse History: None  Patient / Family  Perceptions, Expectations & Goals Pt/Family understanding of illness & functional limitations: Pt and family with good, general understanding of the medical issues that led to need for BKA and of current functional limitations/ need for CIR. Premorbid pt/family roles/activities: pt was independent overall and still working until recently. Anticipated changes in roles/activities/participation: PEr goals of supervision from team, family will need to coordinate care as plan, at time of admit, was intermittent availability only. Pt/family expectations/goals: "I am taking it one step at a time.  Hope I can be independent."  Recruitment consultant: None Premorbid Home Care/DME Agencies: None Transportation available at discharge: Yes Resource referrals recommended: Neuropsychology, Support group (specify)  Discharge Planning Living Arrangements: Children Support Systems: Children, Friends/neighbors Type of Residence: Private residence Insurance Resources: Multimedia programmer (specify)(BCBS) Financial Resources: Employment Financial Screen Referred: No Living Expenses: Own Money Management: Patient Does the patient have any problems obtaining your medications?: No Home Management: pt and family share responsibilities Patient/Family Preliminary Plans: Pt to return home with son, his gf and their children. Sw Barriers to Discharge: Lack of/limited family support(will need to be able to coordinate 2/47 coverage) Sw Barriers to Discharge Comments: Will discuss further with pt, family and team about supervision goals Social Work Anticipated Follow Up Needs: HH/OP Expected length of stay: 10-12 days  Clinical Impression Pleasant, soft spoken woman here following BKA and reports she is "just absorbing all of it" right now.  Son and his family living in the home, however, he and gf are working and only intermittent support available.  Pt hopeful she can reach a more independent level of  function by d/c.  Of note, she was widowed in 05-30-18, however, pt does not speak to this very much.  May benefit from neuropsychology support while here.  SW to follow as well for support and d/c planning.  HOYLE, LUCY 01/07/2019, 4:53 PM

## 2019-01-07 NOTE — Progress Notes (Signed)
Physical Therapy Session Note  Patient Details  Name: [name] MRN: [id number] Date of Birth: 1966-07-23  Today's Date: 01/07/2019 PT Individual Time: 1036-1106 PT Individual Time Calculation (min): 30 min   Short Term Goals: Week 1:  PT Short Term Goal 1 (Week 1): pt will perform functional transfers with min A PT Short Term Goal 2 (Week 1): pt will perform w/c parts management with supervision  Skilled Therapeutic Interventions/Progress Updates:    pt in bed, agreeable to sitting on edge of bed for therapy, unwilling to get out of bed due to pain in Lt heel.  PT encouraged pt to attempt sliding board transfer to decrease wt bearing on heel during transfers, pt continues to refuse. Pt performs 3 x 10 with 3# dowel: shoulder flex, chest press, bicep curls, circles CW/CCW, should horiz abd/add. Pt performs with theraband shoulder diagonals and shoulder horiz abd all 3 x 10. Pt left in bed with alarm set, needs at hand.  Therapy Documentation Precautions:  Precautions Precautions: Fall Precaution Comments: R BKA wound vac Required Braces or Orthoses: Other Brace Restrictions Weight Bearing Restrictions: No RLE Weight Bearing: Non weight bearing General: PT Amount of Missed Time (min): 45 Minutes PT Missed Treatment Reason: Pain Pain: Pt c/o heel pain in Lt LE, RN aware, repositioned as needed   Therapy/Group: Individual Therapy  DONAWERTH,KAREN 01/07/2019, 11:07 AM

## 2019-01-07 NOTE — Progress Notes (Signed)
Physical Therapy Session Note  Patient Details  Name: [name] MRN: [id number] Date of Birth: 1965/12/30  Today's Date: 01/07/2019 PT Individual Time: 1306-1405 PT Individual Time Calculation (min): 59 min   Short Term Goals: Week 1:  PT Short Term Goal 1 (Week 1): pt will perform functional transfers with min A PT Short Term Goal 2 (Week 1): pt will perform w/c parts management with supervision  Skilled Therapeutic Interventions/Progress Updates:  Pt received in bed reporting need to use restroom. Pt transfers to sitting EOB with bed features & supervision. Pt completes lateral scoot transfer bed<>w/c with min assist overall. Transported pt in/out of bathroom via w/c dependent assist and pt transfers w/c<>toilet with grab bar & min assist for squat pivot. Pt is able to perform lateral leans to pull clothing below hips, min assist to completely pull clothing over buttocks in back when finished. Pt with continent BM & void on toilet with extra time given & pt able to perform peri hygiene without assistance. Pt performs hand hygiene at sink from w/c level with set up assist. Pt propels w/c room>gym with BUE & supervision. Pt sets up w/c for transfers with max step by step cuing for technique & w/c parts management. Pt transfers w/c<>mat table via squat pivot with min assist. While sitting EOM pt performs sit ups on largest brown wedge with intermittent addition of weighted ball with task focusing on core strengthening & dynamic sitting balance. At end of session pt returns to bed, and sit>supine with supervision. Pt left in bed with alarm set & needs at hand.   During session pt requires max cuing for w/c brake & parts management.   At end of session therapist observed pt's wound vac to be off. Therapist plugged wound vac in & made charge nurse aware (pt unable to recall how long wound vac had been off).    Therapy Documentation Precautions:  Precautions Precautions: Fall Precaution  Comments: R BKA wound vac Required Braces or Orthoses: Other Brace Restrictions Weight Bearing Restrictions: No RLE Weight Bearing: Non weight bearing   Pain: C/o 5/10 throbbing in L heel but pt unable to receive scheduled pain medicine yet (per chart/RN).    Therapy/Group: Individual Therapy  Waunita Schooner 01/07/2019, 4:12 PM

## 2019-01-08 ENCOUNTER — Inpatient Hospital Stay (HOSPITAL_COMMUNITY): Payer: BLUE CROSS/BLUE SHIELD | Admitting: Physical Therapy

## 2019-01-08 ENCOUNTER — Inpatient Hospital Stay (HOSPITAL_COMMUNITY): Payer: BLUE CROSS/BLUE SHIELD

## 2019-01-08 DIAGNOSIS — Z89511 Acquired absence of right leg below knee: Secondary | ICD-10-CM

## 2019-01-08 DIAGNOSIS — R7309 Other abnormal glucose: Secondary | ICD-10-CM

## 2019-01-08 DIAGNOSIS — N183 Chronic kidney disease, stage 3 (moderate): Secondary | ICD-10-CM

## 2019-01-08 DIAGNOSIS — R0989 Other specified symptoms and signs involving the circulatory and respiratory systems: Secondary | ICD-10-CM

## 2019-01-08 DIAGNOSIS — I10 Essential (primary) hypertension: Secondary | ICD-10-CM

## 2019-01-08 DIAGNOSIS — D649 Anemia, unspecified: Secondary | ICD-10-CM

## 2019-01-08 DIAGNOSIS — E1142 Type 2 diabetes mellitus with diabetic polyneuropathy: Secondary | ICD-10-CM

## 2019-01-08 DIAGNOSIS — E1165 Type 2 diabetes mellitus with hyperglycemia: Secondary | ICD-10-CM

## 2019-01-08 DIAGNOSIS — G546 Phantom limb syndrome with pain: Secondary | ICD-10-CM

## 2019-01-08 DIAGNOSIS — I5032 Chronic diastolic (congestive) heart failure: Secondary | ICD-10-CM

## 2019-01-08 LAB — GLUCOSE, CAPILLARY
Glucose-Capillary: 85 mg/dL (ref 70–99)
Glucose-Capillary: 103 mg/dL — ABNORMAL HIGH (ref 70–99)
Glucose-Capillary: 73 mg/dL (ref 70–99)
Glucose-Capillary: 151 mg/dL — ABNORMAL HIGH (ref 70–99)
Glucose-Capillary: 217 mg/dL — ABNORMAL HIGH (ref 70–99)

## 2019-01-08 MED ORDER — AMITRIPTYLINE HCL 10 MG PO TABS
5.0000 mg | ORAL_TABLET | Freq: Every day | ORAL | Status: DC
  Administered 2019-01-08 – 2019-01-14 (×7): 5 mg via ORAL
  Filled 2019-01-08 (×8): qty 0.5

## 2019-01-08 NOTE — Progress Notes (Signed)
Physical Therapy Session Note  Patient Details  Name: [name] MRN: [id number] Date of Birth: [birth year]  Today's Date: 01/08/2019 PT Individual Time: 0945-1050 PT Individual Time Calculation (min): 65 min   Short Term Goals: Week 1:  PT Short Term Goal 1 (Week 1): pt will perform functional transfers with min A PT Short Term Goal 2 (Week 1): pt will perform w/c parts management with supervision  Skilled Therapeutic Interventions/Progress Updates:    Patient in supine agreeable to PT.  Reports had Tylenol earlier for pain.  Hasn't received boot for L foot yet.  Performed supine to sit on EOB with S.  Scoot/squat pivot to w/c min a.  Assisted to don brace R leg in sitting.  Placed wound vac in bag on back of w/c.  Patient propelled about 40' with S prior to arm fatigue.  Assisted rest of the way to gym.  In parallel bars performed sit to stand min a and stood for R LE hip abduction and flexion x 10 each.  Patient reports able to keep most of the weight off her heel.  Education on w/c set up for transfer to mat min A to remove legrest and pt pivot to mat min A.  Performed sit to supine with S.  Supine, side and prone therex as noted below after brace removal.  Applied brace in supine and pt supine to sit S increased time and heavy UE use.  Patient transferred to R back to w/c with min A.  Discussed car transfer, but pt reported too fatigued to practice this session.  W/c mobility back to her room with S.  Squat pivot to bed min A after education and assist for legrest removal.  Patient sit to supine S.  Obtained HEP for BKA exercises and reviewed with pt issued in her notebook.  Left in supine with call bell in reach and bed alarm activated.   Therapy Documentation Precautions:  Precautions Precautions: Fall Precaution Comments: R BKA wound vac Required Braces or Orthoses: Other Brace Restrictions Weight Bearing Restrictions: No RLE Weight Bearing: Weight bearing as  tolerated General: PT Amount of Missed Time (min): 10 Minutes PT Missed Treatment Reason: Patient fatigue Pain: Pain Assessment Pain Score: 4  Pain Type: Acute pain Pain Location: Leg Pain Orientation: Right Pain Descriptors / Indicators: Sore;Aching Pain Intervention(s): Repositioned   Exercises: Amputee Exercises Quad Sets: Strengthening;Right;10 reps;Supine Gluteal Sets: Strengthening;Both;10 reps;Supine Towel Squeeze: Strengthening;10 reps;Supine;Both Hip Extension: Strengthening;10 reps;Sidelying Hip ABduction/ADduction: Strengthening;Right;10 reps;Sidelying Knee Flexion: Right;10 reps;Other (comment)(prone) Knee Extension: Right;Strengthening;10 reps;Supine(5 sec hold extension to 12 degrees,  9 degrees on L) Straight Leg Raises: Strengthening;Right;10 reps;Sidelying    Therapy/Group: Individual Therapy  Reginia Naas  Magda Kiel, PT 01/08/2019, 10:55 AM

## 2019-01-08 NOTE — Progress Notes (Signed)
Physical Therapy Session Note  Patient Details  Name: [name] MRN: [id number] Date of Birth: 08/29/1966  Today's Date: 01/08/2019 PT Individual Time: 1301-1346 PT Individual Time Calculation (min): 45 min   Short Term Goals: Week 1:  PT Short Term Goal 1 (Week 1): pt will perform functional transfers with min A PT Short Term Goal 2 (Week 1): pt will perform w/c parts management with supervision  Skilled Therapeutic Interventions/Progress Updates:   Pt received sidelying in bed reporting she did not want to participate with therapy but knows she needs to and is agreeable to session. Pt performed supine to sit, HOB partially elevated and using bedrails, with close supervision for safety. Therapist donned R LE limb protector for session. Pt performed L lateral scoot transfer EOB to w/c with CGA for steadying at trunk and cuing for sequencing. Therapist educated pt on attaching/removing leg rests with pt requiring repeated cuing during session for locating lever to remove leg rests. Pt performed B UE w/c propulsion ~122ft with supervision. Pt performed L stand pivot transfer using RW from w/c to lower height car with mod assist for lifting into standing and balance during transfer with cuing for sequencing of AD and pivoting on L LE to decreased weightbearing through heel. Pt attempted stand pivot transfer car to w/c using RW with max assist for lifting to standing but pt reporting fear of falling and requests to return to sitting on car seat. Pt request to attempt car to w/c transfer with different technique due to fear of falling. Pt performed lateral scoot transfer car to w/c using transfer board with min assist for balance and cuing for head/hips relationship and UE placement to improve amplitude of scoot and sitting balance. Pt transported back to room in w/c for time management. Performed R lateral scoot transfer w/c to EOB with min assist for trunk control and scooting. Therapist doffed R LE  limb protector. Sit to supine, HOB partially elevated and using bedrails, with supervision. Pt left supine in bed with needs in reach, lines intact, and bed alarm on.   Therapy Documentation Precautions:  Precautions Precautions: Fall Precaution Comments: R BKA wound vac Required Braces or Orthoses: Other Brace Restrictions Weight Bearing Restrictions: Yes RLE Weight Bearing: Non weight bearing  Pain: Reports mild pain along incision of R LE with tenderness to the touch - therapist donned limb protector for session and provided repositioning throughout for pain management.    Therapy/Group: Individual Therapy  Tawana Scale, PT, DPT 01/08/2019, 3:21 PM

## 2019-01-08 NOTE — Progress Notes (Signed)
Occupational Therapy Session Note  Patient Details  Name: [name] MRN: [id number] Date of Birth: 1966-01-16  Today's Date: 01/08/2019 OT Individual Time: 8502-7741 OT Individual Time Calculation (min): 55 min    Short Term Goals: Week 1:  OT Short Term Goal 1 (Week 1): Pt will transfer to [facility] wiht LRAD and MIN A OT Short Term Goal 2 (Week 1): Pt will thread BLE into pants with AE PRN OT Short Term Goal 3 (Week 1): Pt will complete lateral leans wiht S on commode for 1/3 steps of toileting OT Short Term Goal 4 (Week 1): Pt will advance pants past hips wiht S   Skilled Therapeutic Interventions/Progress Updates:    1;1. Pt received EOB requesting to wash up and get dressed. Discussed with pt calling son to bring alternate clothing as current clothing will be very tight. Pt completes UB/LB dressing with supervision EOB using lateral elans to doff current clothing, wash buttocks. Pt educated on AE for LB dressing (reacher/sock aide). A provided to wash L foot. Educated pt on shower DME (TTB) and adaptations to home showers to improve safety. Pt declines practice shower transfer at this time. Exited session with pt seated in bed, exit alarm on and call light in reach  Therapy Documentation Precautions:  Precautions Precautions: Fall Precaution Comments: R BKA wound vac Required Braces or Orthoses: Other Brace Restrictions Weight Bearing Restrictions: Yes RLE Weight Bearing: Non weight bearing General:   Vital Signs: Therapy Vitals Pulse Rate: 69 Resp: 18 BP: 110/64 Patient Position (if appropriate): Sitting Oxygen Therapy SpO2: 99 % O2 Device: Room Air Pain: Pain Assessment Pain Scale: 0-10 Pain Score: 4  Pain Type: Acute pain Pain Location: Leg Pain Orientation: Right Pain Descriptors / Indicators: Aching;Throbbing Pain Frequency: Constant Pain Onset: On-going Pain Intervention(s): Medication (See eMAR) ADL: ADL Upper Body Bathing: Supervision/safety Where  Assessed-Upper Body Bathing: Edge of bed Lower Body Bathing: Supervision/safety Where Assessed-Lower Body Bathing: Edge of bed Upper Body Dressing: Supervision/safety Where Assessed-Upper Body Dressing: Edge of bed Toilet Transfer: Moderate assistance Toilet Transfer Method: Engineer, water: Art gallery manager    Praxis   Exercises:   Other Treatments:     Therapy/Group: Individual Therapy  Tonny Branch 01/08/2019, 3:19 PM

## 2019-01-08 NOTE — Progress Notes (Addendum)
[facility] PHYSICAL MEDICINE & REHABILITATION PROGRESS NOTE   Subjective/Complaints: Patient seen sitting up in bed this morning.  He states he slept fairly overnight due to pain.  ROS: + Phantom limb pain.  Denies CP, shortness of breath, nausea, vomiting, diarrhea.  Objective:   No results found. Recent Labs    01/06/19 0636  WBC 10.1  HGB 8.7*  HCT 26.6*  PLT 566*   Recent Labs    01/06/19 0636  NA 136  K 3.6  CL 93*  CO2 34*  GLUCOSE 90  BUN 34*  CREATININE 1.29*  CALCIUM 8.5*    Intake/Output Summary (Last 24 hours) at 01/08/2019 1109 Last data filed at 01/08/2019 0800 Gross per 24 hour  Intake 360 ml  Output -  Net 360 ml     Physical Exam: Vital Signs Blood pressure (!) 145/72, pulse 72, temperature 98.7 F (37.1 C), temperature source Oral, resp. rate 18, height 5\' 3"  (1.6 m), weight 67.9 kg, last menstrual period 02/12/2011, SpO2 99 %. Constitutional: No distress . Vital signs reviewed. HENT: Normocephalic.  Atraumatic. Eyes: EOMI. No discharge. Cardiovascular: RRR. No JVD. Respiratory: CTA Bilaterally. Normal effort. GI: BS +. Non-distended. Musc: Right stump with tenderness Neurological: She is alert.  She follows full commands.  Motor: Bilateral upper extremities: 4+/5 proximal distal Left lower extremity: 4+/5 proximal to distal Right lower extremity: Hip flexion 4/5 (pain inhibition) Skin: Amputation site with VAC Psychiatric: Slightly anxious  Assessment/Plan: 1. Functional deficits secondary to Right BKA which require 3+ hours per day of interdisciplinary therapy in a comprehensive inpatient rehab setting.  Physiatrist is providing close team supervision and 24 hour management of active medical problems listed below.  Physiatrist and rehab team continue to assess barriers to discharge/monitor patient progress toward functional and medical goals  Care Tool:  Bathing    Body parts bathed by patient: Right arm, Left arm, Chest, Abdomen,  Front perineal area, Buttocks, Right upper leg, Left upper leg, Face   Body parts bathed by helper: Left lower leg Body parts n/a: Right lower leg   Bathing assist Assist Level: Minimal Assistance - Patient > 75%     Upper Body Dressing/Undressing Upper body dressing   What is the patient wearing?: Pull over shirt    Upper body assist Assist Level: Supervision/Verbal cueing    Lower Body Dressing/Undressing Lower body dressing      What is the patient wearing?: Underwear/pull up, Pants     Lower body assist Assist for lower body dressing: Maximal Assistance - Patient 25 - 49%     Toileting Toileting    Toileting assist Assist for toileting: Minimal Assistance - Patient > 75%     Transfers Chair/bed transfer  Transfers assist     Chair/bed transfer assist level: Minimal Assistance - Patient > 75%     Locomotion Ambulation   Ambulation assist   Ambulation activity did not occur: Safety/medical concerns(blister on Lt heel, Rt BKA)          Walk 10 feet activity   Assist           Walk 50 feet activity   Assist           Walk 150 feet activity   Assist           Walk 10 feet on uneven surface  activity   Assist           Wheelchair     Assist Will patient use wheelchair at discharge?: Yes Type  of Wheelchair: Manual    Wheelchair assist level: Supervision/Verbal cueing Max wheelchair distance: 150 ft    Wheelchair 50 feet with 2 turns activity    Assist        Assist Level: Supervision/Verbal cueing   Wheelchair 150 feet activity     Assist Wheelchair 150 feet activity did not occur: Safety/medical concerns   Assist Level: Supervision/Verbal cueing    Medical Problem List and Plan: 1.  Decreased functional mobility secondary to right BKA on 12/31/2018. Wound VAC as directed.  Continue CIR 2.  Antithrombotics: -DVT/anticoagulation:  SCD left lower extremity             -antiplatelet therapy:  aspirin 81 mg daily 3. Pain Management:   -Neurontin 300 mg 3 times a day  -Robaxin as needed.patient  -no narcotics at this time due to increased sensitivity to pain medication  Elavil 5 mg started on 5/2 for phantom limb pain, predominantly at night 4. Mood: Lexapro 10 mg daily             -antipsychotic agents: N/A 5. Neuropsych: This patient is not yet fully capable of making decisions on her own behalf. 6. Skin/Wound Care:  Routine skin checks.  DC wound VAC 1 week postop.  -order prevalon boot for further protection of left heel  -foam dressing for heel sore 7. Fluids/Electrolytes/Nutrition:  Routine ins and outs 8. Acute on chronic anemia.   Hemoglobin 8.7 on 4/30, trending down, labs ordered for Monday  continue to monitor 9. Diabetes mellitus peripheral neuropathy. Hemoglobin A1c 10.5.  -NovoLog 4 units 3 times a day  Lantus insulin 40 units daily at bedtime.,  Decreased to 35  -consider AM lantus dosing  Labile with hypoglycemia overnight 10. CAD with CABG. Continue aspirin. No chest pain or shortness of breath 11. Chronic diastolic congestive heart failure.  Monitor for signs and symptoms of fluid overload.  Filed Weights   01/05/19 1703 01/08/19 0357  Weight: 67.8 kg 67.9 kg   Stable on 5/2 12. Hypertension. Imdur 15 mg daily, Norvasc 5 mg daily.  Monitor with increased mobility.  Labile on 5/2, monitor for trend 13. Hyperlipidemia. Lipitor 14. Hypothyroidism. Synthroid 15.Chronic kidney disease. Creatinine baseline 1.32-2.04.  Creatinine 1.29 on 4/30  Continue to monitor 16. Constipation. Laxative assistance 17. Nausea:?  Improving  Continue meds  LOS: 3 days A FACE TO FACE EVALUATION WAS PERFORMED  Ankit Lorie Phenix 01/08/2019, 11:09 AM

## 2019-01-09 DIAGNOSIS — E162 Hypoglycemia, unspecified: Secondary | ICD-10-CM

## 2019-01-09 DIAGNOSIS — E119 Type 2 diabetes mellitus without complications: Secondary | ICD-10-CM

## 2019-01-09 LAB — GLUCOSE, CAPILLARY
Glucose-Capillary: 46 mg/dL — ABNORMAL LOW (ref 70–99)
Glucose-Capillary: 61 mg/dL — ABNORMAL LOW (ref 70–99)
Glucose-Capillary: 95 mg/dL (ref 70–99)
Glucose-Capillary: 121 mg/dL — ABNORMAL HIGH (ref 70–99)
Glucose-Capillary: 136 mg/dL — ABNORMAL HIGH (ref 70–99)
Glucose-Capillary: 127 mg/dL — ABNORMAL HIGH (ref 70–99)

## 2019-01-09 MED ORDER — GLUCOSE 40 % PO GEL
ORAL | Status: AC
  Filled 2019-01-09: qty 1

## 2019-01-09 MED ORDER — INSULIN GLARGINE 100 UNIT/ML ~~LOC~~ SOLN
20.0000 [IU] | Freq: Every day | SUBCUTANEOUS | Status: DC
  Administered 2019-01-09: 20 [IU] via SUBCUTANEOUS
  Filled 2019-01-09 (×2): qty 0.2

## 2019-01-09 NOTE — Progress Notes (Signed)
[place] PHYSICAL MEDICINE & REHABILITATION PROGRESS NOTE   Subjective/Complaints: Patient seen sitting up in bed this morning.  She states she slept well overnight.  Nursing present, discussed hypoglycemia.  ROS: Denies CP, shortness of breath, nausea, vomiting, diarrhea.  Objective:   No results found. No results for input(s): WBC, HGB, HCT, PLT in the last 72 hours. No results for input(s): NA, K, CL, CO2, GLUCOSE, BUN, CREATININE, CALCIUM in the last 72 hours.  Intake/Output Summary (Last 24 hours) at 01/09/2019 1047 Last data filed at 01/09/2019 0900 Gross per 24 hour  Intake 980 ml  Output -  Net 980 ml     Physical Exam: Vital Signs Blood pressure (!) 149/90, pulse 79, temperature 98.7 F (37.1 C), temperature source Oral, resp. rate 16, height 5\' 3"  (1.6 m), weight 66.7 kg, last menstrual period 02/12/2011, SpO2 98 %. Constitutional: No distress . Vital signs reviewed. HENT: Normocephalic.  Atraumatic. Eyes: EOMI. No discharge. Cardiovascular: No JVD. Respiratory: Normal effort. GI: Non-distended. Musc: Right stump with tenderness Neurological: She is alert.  She follows full commands.  Motor: Bilateral upper extremities: 4+/5 proximal distal Left lower extremity: 4+/5 proximal to distal, stable Right lower extremity: Hip flexion 4/5 (pain inhibition), stable Skin: Amputation site with VAC Psychiatric: Slightly anxious  Assessment/Plan: 1. Functional deficits secondary to Right BKA which require 3+ hours per day of interdisciplinary therapy in a comprehensive inpatient rehab setting.  Physiatrist is providing close team supervision and 24 hour management of active medical problems listed below.  Physiatrist and rehab team continue to assess barriers to discharge/monitor patient progress toward functional and medical goals  Care Tool:  Bathing    Body parts bathed by patient: Right arm, Left arm, Chest, Abdomen, Front perineal area, Buttocks, Right upper leg,  Left upper leg, Face   Body parts bathed by helper: Left lower leg Body parts n/a: Right lower leg   Bathing assist Assist Level: Minimal Assistance - Patient > 75%     Upper Body Dressing/Undressing Upper body dressing   What is the patient wearing?: Pull over shirt    Upper body assist Assist Level: Supervision/Verbal cueing    Lower Body Dressing/Undressing Lower body dressing      What is the patient wearing?: Ace wrap/stump shrinker     Lower body assist Assist for lower body dressing: Supervision/Verbal cueing     Toileting Toileting    Toileting assist Assist for toileting: Minimal Assistance - Patient > 75%     Transfers Chair/bed transfer  Transfers assist     Chair/bed transfer assist level: Minimal Assistance - Patient > 75%(sliding board)     Locomotion Ambulation   Ambulation assist   Ambulation activity did not occur: Safety/medical concerns(blister on Lt heel, Rt BKA)          Walk 10 feet activity   Assist           Walk 50 feet activity   Assist           Walk 150 feet activity   Assist           Walk 10 feet on uneven surface  activity   Assist           Wheelchair     Assist Will patient use wheelchair at discharge?: Yes Type of Wheelchair: Manual    Wheelchair assist level: Supervision/Verbal cueing Max wheelchair distance: 13ft    Wheelchair 50 feet with 2 turns activity    Assist  Assist Level: Supervision/Verbal cueing   Wheelchair 150 feet activity     Assist Wheelchair 150 feet activity did not occur: Safety/medical concerns   Assist Level: Supervision/Verbal cueing    Medical Problem List and Plan: 1.  Decreased functional mobility secondary to right BKA on 12/31/2018. Wound VAC as directed.  Continue CIR 2.  Antithrombotics: -DVT/anticoagulation:  SCD left lower extremity             -antiplatelet therapy: aspirin 81 mg daily 3. Pain Management:    -Neurontin 300 mg 3 times a day  -Robaxin as needed.patient  -no narcotics at this time due to increased sensitivity to pain medication  Elavil 5 mg started on 5/2 for phantom limb pain, predominantly at night-effective 4. Mood: Lexapro 10 mg daily             -antipsychotic agents: N/A 5. Neuropsych: This patient is not yet fully capable of making decisions on her own behalf. 6. Skin/Wound Care:  Routine skin checks.    VAC DC'd on 5/3  -order prevalon boot for further protection of left heel  -foam dressing for heel sore 7. Fluids/Electrolytes/Nutrition:  Routine ins and outs 8. Acute on chronic anemia.   Hemoglobin 8.7 on 4/30, trending down, labs ordered for tomorrow  continue to monitor 9. Diabetes mellitus peripheral neuropathy. Hemoglobin A1c 10.5.  -NovoLog 4 units 3 times a day  Lantus insulin 40 units daily at bedtime.,  Decreased to 35, decreased to 20 on 5/3  Will require further adjustments to mealtime coverage versus a.m. insulin   Patient does not have a stable regimen of insulin or meal intake at home, but states her sugars are usually controlled, given hemoglobin A1c question accuracy  Extremely labile with hypoglycemia overnight 10. CAD with CABG. Continue aspirin. No chest pain or shortness of breath 11. Chronic diastolic congestive heart failure.  Monitor for signs and symptoms of fluid overload.  Filed Weights   01/05/19 1703 01/08/19 0357 01/09/19 0618  Weight: 67.8 kg 67.9 kg 66.7 kg   Stable on 5/3 12. Hypertension. Imdur 15 mg daily, Norvasc 5 mg daily.  Monitor with increased mobility.  Labile on 1/3, monitor for trend 13. Hyperlipidemia. Lipitor 14. Hypothyroidism. Synthroid 15.Chronic kidney disease. Creatinine baseline 1.32-2.04.  Creatinine 1.29 on 4/30  Continue to monitor 16. Constipation. Laxative assistance 17. Nausea:?  Improving  Continue meds  LOS: 4 days A FACE TO FACE EVALUATION WAS PERFORMED  Ankit Lorie Phenix 01/09/2019, 10:47 AM

## 2019-01-09 NOTE — Significant Event (Signed)
Hypoglycemic Event  CBG:46  Treatment: 8 oz juice/soda  Symptoms: None  Follow-up CBG: [id number] CBG Result:61  Possible Reasons for Event: Unknown  Comments/MD notified:treated per protocol. Dr. Posey Pronto made aware.     [name] A

## 2019-01-09 NOTE — Progress Notes (Signed)
[facility], getting up to [facility]. BM tonight. Portable VAC in place to Right BKA, Left heel with foam dressing in place. Elevated off bed with pillows, doesn't stay elevated because patient restless in bed. Educated patient on monitoring left heel and try and keep heel elevated off bed. Denies pain. No PRN meds given.Patrici Ranks A

## 2019-01-09 NOTE — Significant Event (Signed)
Hypoglycemic Event  CBG: 61  Treatment: 4 oz juice/soda  Symptoms: None  Follow-up CBG: Time:0700 CBG Result:95  Possible Reasons for Event: Unknown  Comments/MD notified:treated per protocol. Breakfast given to patient.    [name] A

## 2019-01-10 ENCOUNTER — Inpatient Hospital Stay (HOSPITAL_COMMUNITY): Payer: BLUE CROSS/BLUE SHIELD | Admitting: Occupational Therapy

## 2019-01-10 ENCOUNTER — Inpatient Hospital Stay (HOSPITAL_COMMUNITY): Payer: BLUE CROSS/BLUE SHIELD | Admitting: Physical Therapy

## 2019-01-10 ENCOUNTER — Inpatient Hospital Stay (HOSPITAL_COMMUNITY): Payer: BLUE CROSS/BLUE SHIELD

## 2019-01-10 ENCOUNTER — Inpatient Hospital Stay (HOSPITAL_COMMUNITY): Payer: Self-pay

## 2019-01-10 LAB — GLUCOSE, CAPILLARY
Glucose-Capillary: 38 mg/dL — CL (ref 70–99)
Glucose-Capillary: 56 mg/dL — ABNORMAL LOW (ref 70–99)
Glucose-Capillary: 82 mg/dL (ref 70–99)
Glucose-Capillary: 66 mg/dL — ABNORMAL LOW (ref 70–99)
Glucose-Capillary: 67 mg/dL — ABNORMAL LOW (ref 70–99)
Glucose-Capillary: 113 mg/dL — ABNORMAL HIGH (ref 70–99)
Glucose-Capillary: 153 mg/dL — ABNORMAL HIGH (ref 70–99)
Glucose-Capillary: 170 mg/dL — ABNORMAL HIGH (ref 70–99)

## 2019-01-10 LAB — CBC WITH DIFFERENTIAL/PLATELET
Abs Immature Granulocytes: 0.05 10*3/uL (ref 0.00–0.07)
Basophils Absolute: 0.1 10*3/uL (ref 0.0–0.1)
Basophils Relative: 1 %
Eosinophils Absolute: 0.3 10*3/uL (ref 0.0–0.5)
Eosinophils Relative: 3 %
HCT: 29.7 % — ABNORMAL LOW (ref 36.0–46.0)
Hemoglobin: 9.4 g/dL — ABNORMAL LOW (ref 12.0–15.0)
Immature Granulocytes: 0 %
Lymphocytes Relative: 17 %
Lymphs Abs: 2 10*3/uL (ref 0.7–4.0)
MCH: 26.2 pg (ref 26.0–34.0)
MCHC: 31.6 g/dL (ref 30.0–36.0)
MCV: 82.7 fL (ref 80.0–100.0)
Monocytes Absolute: 0.9 10*3/uL (ref 0.1–1.0)
Monocytes Relative: 8 %
Neutro Abs: 8.7 10*3/uL — ABNORMAL HIGH (ref 1.7–7.7)
Neutrophils Relative %: 71 %
Platelets: 699 10*3/uL — ABNORMAL HIGH (ref 150–400)
RBC: 3.59 MIL/uL — ABNORMAL LOW (ref 3.87–5.11)
RDW: 13.5 % (ref 11.5–15.5)
WBC: 12.1 10*3/uL — ABNORMAL HIGH (ref 4.0–10.5)
nRBC: 0 % (ref 0.0–0.2)

## 2019-01-10 MED ORDER — INSULIN ASPART 100 UNIT/ML ~~LOC~~ SOLN
2.0000 [IU] | Freq: Three times a day (TID) | SUBCUTANEOUS | Status: DC
  Administered 2019-01-11 – 2019-01-14 (×9): 2 [IU] via SUBCUTANEOUS

## 2019-01-10 MED ORDER — INSULIN GLARGINE 100 UNIT/ML ~~LOC~~ SOLN
15.0000 [IU] | Freq: Every day | SUBCUTANEOUS | Status: DC
  Administered 2019-01-10 – 2019-01-13 (×4): 15 [IU] via SUBCUTANEOUS
  Filled 2019-01-10 (×6): qty 0.15

## 2019-01-10 NOTE — Progress Notes (Signed)
Hypoglycemic Event  CBG: 67  Treatment: 4 oz juice/soda  Symptoms: None  Follow-up CBG: Time:1224 CBG Result:66  Possible Reasons for Event: Medication  Comments/MD notified:    Linton Flemings

## 2019-01-10 NOTE — Consult Note (Signed)
[place] Nurse wound consult note Reason for Consult: Deep tissue pressure injury (DTPI)  to left heel Wound type:Pressure Pressure Injury POA: No Measurement: 2cm x 3.4cm deep purple discoloration to left posterior heel Wound bed:As described above Drainage (amount, consistency, odor) None Periwound: Intact Dressing procedure/placement/frequency: Silicone foam dressing has been applied by Nursing staff and I agree with this evidence based plan of care.  Additionally, a pressure redistribution heel boot has been provided.  Guidance is provided for the Nursing staff via the Orders to peel back silicone foam to assess the area.  [place] [place] nursing team will not follow, but will remain available to this patient, the nursing and medical teams.  Please re-consult if needed. Thanks, Maudie Flakes, MSN, RN, [place], Arther Abbott  Pager# [phone]

## 2019-01-10 NOTE — Significant Event (Signed)
Hypoglycemic Event  CBG: 56  Treatment: 8 oz juice/soda  Symptoms: None  Follow-up CBG: [id number] CBG Result:82  Possible Reasons for Event: Unknown  Comments/MD notified:treated per protocol    Patrici Ranks A

## 2019-01-10 NOTE — Progress Notes (Signed)
Patient refused her insulin, she did not eat her dinner. Patient stated she wants it to be held. Will report to night nurse for follow up. Will continue to monitor patient.

## 2019-01-10 NOTE — Progress Notes (Signed)
Physical Therapy Session Note  Patient Details  Name: [name] MRN: [id number] Date of Birth: December 26, 1965  Today's Date: 01/10/2019 PT Individual Time: 0906-1000 and 1331-1429 PT Individual Time Calculation (min): 54 min and 58 min  Short Term Goals: Week 1:  PT Short Term Goal 1 (Week 1): pt will perform functional transfers with min A PT Short Term Goal 2 (Week 1): pt will perform w/c parts management with supervision  Skilled Therapeutic Interventions/Progress Updates:  Treatment 1: Pt received sitting on EOB & agreeable to tx. Pt reports 5/10 RLE pain but states she's premedicated. Discussed car transfer & pt reports she prefers [place] over smaller car. Also discussed use of slide board vs without and need to practice with/without AD consistently to ensure pt has good understanding of transfers & technique prior to d/c home. Pt transfers bed>w/c via lateral scoot without AD & supervision. Pt requires step by step instructions for management of w/c parts once in w/c and ultimately requires assistance to place R amputee support pad on w/c as pt unable to align pins & reports impaired vision. Pt propels w/c room<>ortho gym with BUE & distant supervision with 3 rest breaks one way 2/2 fatigue; educated pt on ability to let wheels rotate a couple times between pushes to conserve energy. Discussed various methods of transferring into elevated SUV prior to practicing. Pt requires cuing to manage w/c parts and for w/c set up to attempt stand pivot transfer to simulator car set at elevated SUV height. Pt attempted to transfer to standing for stand pivot w/c>car multiple times with assistance, ultimately recognizing task would be difficult. Simulator car set at [employer] & pt completes lateral scoot w/c<>car with min assist. Pt recognizes car will be easier & more safe to ride home in & is agreeable to using car vs [place] - pt needs to contact mother/son to see if she can borrow her mother's car.  At end of session pt left sitting in w/c with chair alarm donned & all needs at hand.  Treatment 2: Pt received in handoff from PT. Pt agreeable to tx, initially denying c/o pain but then reporting L heel soreness during session so very little weight bearing through LLE performed during session. Pt does report fatigue following PT session. Pt reports she spoke to her son & pt will be able to ride home in her son's [place]. Dicussed w/c in home with pt reporting most of her doorways are >26 inches wide but will request her son measure all doorways. Pt transfers mat table>w/c with supervision via lateral scoot and cuing to clear buttocks over w/c cushion. Pt propels w/c around unit with BUE & supervision & rest breaks PRN. Reviewed need to perform pressure relief, frequency at which she needs to complete this, and pressure relieving techniques with pt return demonstrating. Pt completes lateral scoot w/c<>low, compliant couch with supervision. Pt is able to set up w/c with min cuing and manage w/c parts with min cuing and extra time (demonstrates improvement in ability to manage w/c parts). Pt propels w/c between cones to simulate small spaces in home with supervision, retrieving objects from floor after task with appropriate locking of brakes before leaning to retrieve cones with instructional cuing. Pt propels w/c up/down ramp with only min assist for threshold but otherwise supervision with instructional cuing for anterior weight shifting when ascending ramp. Pt utilized BUE ergometer on level 2.5 x 5 minutes forwards + 5 minutes backwards for BUE strengthening & cardiopulmonary  endurance training with rest break provided halfway through.  At end of session pt left sitting in w/c in room with alarm belt donned & all needs at hand.   Therapy Documentation Precautions:  Precautions Precautions: Fall Required Braces or Orthoses: Other Brace Other Brace: Limb protector Restrictions Weight Bearing  Restrictions: Yes RLE Weight Bearing: Non weight bearing      Therapy/Group: Individual Therapy  Waunita Schooner 01/10/2019, 2:34 PM

## 2019-01-10 NOTE — Progress Notes (Signed)
Hypoglycemic Event  CBG: 66  Treatment: 4 oz of apple juice  Symptoms: None  Follow-up CBG: Time:1300 CBG Result:113  Possible Reasons for Event: Medication  Comments/MD notified:    Linton Flemings

## 2019-01-10 NOTE — Progress Notes (Signed)
[place] PHYSICAL MEDICINE & REHABILITATION PROGRESS NOTE   Subjective/Complaints: No new complaints. Vac removed yesterday. Pain reasonable. Nausea is better  ROS: Patient denies fever, rash, sore throat, blurred vision, nausea, vomiting, diarrhea, cough, shortness of breath or chest pain, joint or back pain, headache, or mood change.    Objective:   No results found. No results for input(s): WBC, HGB, HCT, PLT in the last 72 hours. No results for input(s): NA, K, CL, CO2, GLUCOSE, BUN, CREATININE, CALCIUM in the last 72 hours.  Intake/Output Summary (Last 24 hours) at 01/10/2019 0930 Last data filed at 01/09/2019 2300 Gross per 24 hour  Intake 720 ml  Output -  Net 720 ml     Physical Exam: Vital Signs Blood pressure 136/67, pulse 72, temperature 98.7 F (37.1 C), temperature source Oral, resp. rate 18, height 5\' 3"  (1.6 m), weight 65.8 kg, last menstrual period 02/12/2011, SpO2 100 %. Constitutional: No distress . Vital signs reviewed. HEENT: EOMI, oral membranes moist Neck: supple Cardiovascular: RRR without murmur. No JVD    Respiratory: CTA Bilaterally without wheezes or rales. Normal effort    GI: BS +, non-tender, non-distended  Musc: Right stump with tenderness Neurological: She is alert.  She follows full commands.  Motor: Bilateral upper extremities: 4+/5 proximal distal Left lower extremity: 4+/5 proximal to distal, stable Right lower extremity: Hip flexion 4/5 (pain inhibition), stable Skin: Amputation site CDI, small areas of bruising/non-viable tissue along incision Psychiatric: pleasant  Assessment/Plan: 1. Functional deficits secondary to Right BKA which require 3+ hours per day of interdisciplinary therapy in a comprehensive inpatient rehab setting.  Physiatrist is providing close team supervision and 24 hour management of active medical problems listed below.  Physiatrist and rehab team continue to assess barriers to discharge/monitor patient progress  toward functional and medical goals  Care Tool:  Bathing    Body parts bathed by patient: Right arm, Left arm, Chest, Abdomen, Front perineal area, Buttocks, Right upper leg, Left upper leg, Face   Body parts bathed by helper: Left lower leg Body parts n/a: Right lower leg   Bathing assist Assist Level: Minimal Assistance - Patient > 75%     Upper Body Dressing/Undressing Upper body dressing   What is the patient wearing?: Pull over shirt    Upper body assist Assist Level: Supervision/Verbal cueing    Lower Body Dressing/Undressing Lower body dressing      What is the patient wearing?: Ace wrap/stump shrinker     Lower body assist Assist for lower body dressing: Supervision/Verbal cueing     Toileting Toileting    Toileting assist Assist for toileting: Contact Guard/Touching assist     Transfers Chair/bed transfer  Transfers assist     Chair/bed transfer assist level: Minimal Assistance - Patient > 75%(sliding board)     Locomotion Ambulation   Ambulation assist   Ambulation activity did not occur: Safety/medical concerns(blister on Lt heel, Rt BKA)          Walk 10 feet activity   Assist           Walk 50 feet activity   Assist           Walk 150 feet activity   Assist           Walk 10 feet on uneven surface  activity   Assist           Wheelchair     Assist Will patient use wheelchair at discharge?: Yes Type of Wheelchair: Manual  Wheelchair assist level: Supervision/Verbal cueing Max wheelchair distance: 168ft    Wheelchair 50 feet with 2 turns activity    Assist        Assist Level: Supervision/Verbal cueing   Wheelchair 150 feet activity     Assist Wheelchair 150 feet activity did not occur: Safety/medical concerns   Assist Level: Supervision/Verbal cueing    Medical Problem List and Plan: 1.  Decreased functional mobility secondary to right BKA on 12/31/2018. Wound VAC as  directed.  Continue CIR 2.  Antithrombotics: -DVT/anticoagulation:  SCD left lower extremity             -antiplatelet therapy: aspirin 81 mg daily 3. Pain Management:   -Neurontin 300 mg 3 times a day  -Robaxin as needed.patient  -no narcotics at this time due to increased sensitivity to pain medication  Elavil 5 mg started on 5/2 for phantom limb pain, predominantly at night-effective 4. Mood: Lexapro 10 mg daily             -antipsychotic agents: N/A 5. Neuropsych: This patient is not yet fully capable of making decisions on her own behalf. 6. Skin/Wound Care:  Routine skin checks.    VAC DC'd on 5/3---dressing re-applied this morning  -  prevalon boot for further protection of left heel  -foam dressing for heel sore 7. Fluids/Electrolytes/Nutrition:  Routine ins and outs 8. Acute on chronic anemia.   Hemoglobin 8.7 on 4/30, trending down, labs pending for today  continue to monitor 9. Diabetes mellitus peripheral neuropathy. Hemoglobin A1c 10.5.  -NovoLog 4 units 3 times a day  Lantus insulin 40 units daily at bedtime.,  Decreased to 35, decreased to 20 on 5/3---decrease to 15u tonight. Consider am lantus 10. CAD with CABG. Continue aspirin. No chest pain or shortness of breath 11. Chronic diastolic congestive heart failure.  Monitor for signs and symptoms of fluid overload.  Filed Weights   01/08/19 0357 01/09/19 0618 01/10/19 0528  Weight: 67.9 kg 66.7 kg 65.8 kg   Stable on 5/4 12. Hypertension. Imdur 15 mg daily, Norvasc 5 mg daily.  Monitor with increased mobility.  Controlled 5/4 13. Hyperlipidemia. Lipitor 14. Hypothyroidism. Synthroid 15.Chronic kidney disease. Creatinine baseline 1.32-2.04.  Creatinine 1.29 on 4/30  Continue to monitor 16. Constipation. Laxative assistance 17. Nausea:?  Improving  Continue meds  LOS: 5 days A FACE TO FACE EVALUATION WAS PERFORMED  Meredith Staggers 01/10/2019, 9:30 AM

## 2019-01-10 NOTE — Progress Notes (Signed)
Physical Therapy Session Note  Patient Details  Name: [name] MRN: [id number] Date of Birth: [birth year]  Today's Date: 01/10/2019 PT Individual Time: 1302-1330 PT Individual Time Calculation (min): 28 min   Short Term Goals: Week 1:  PT Short Term Goal 1 (Week 1): pt will perform functional transfers with min A PT Short Term Goal 2 (Week 1): pt will perform w/c parts management with supervision  Skilled Therapeutic Interventions/Progress Updates:    Pt seated in w/c upon PT arrival, agreeable to therapy tx and denies pain. Pt propelled w/c to the gym with supervision using B UEs x 150 ft. Pt performed sqaut pivot to the mat with CGA, pt set up the transfer and performed w/c parts management without assist or cues. Pt transferred to supine with supervision and performed B LE therex for strengthening, 2 x 10 each: straight leg raise, sidelying hip abduction, and sidelying hip extension. Pt transferred to long sitting this session for hamstring stretching x 2 minutes, education on importance of maintaining flexibility and performing stretches at home. Pt transferred to prone for hip flexor stretching, education on performing this position at home. In prone pt performed 2 x 10 hip extension bilaterally for LE strengthening. Pt transferred to sitting with supervision and handed off to therapist for next session.   Therapy Documentation Precautions:  Precautions Precautions: Fall Precaution Comments: R BKA wound vac Required Braces or Orthoses: Other Brace Restrictions Weight Bearing Restrictions: Yes RLE Weight Bearing: Non weight bearing   Therapy/Group: Individual Therapy  Netta Corrigan, PT, DPT 01/10/2019, 7:59 AM

## 2019-01-10 NOTE — Care Management (Signed)
[facility] Individual Statement of Services  Patient Name:  [name]  Date:  01/10/2019  Welcome to the [facility].  Our goal is to provide you with an individualized program based on your diagnosis and situation, designed to meet your specific needs.  With this comprehensive rehabilitation program, you will be expected to participate in at least 3 hours of rehabilitation therapies Monday-Friday, with modified therapy programming on the weekends.  Your rehabilitation program will include the following services:  Physical Therapy (PT), Occupational Therapy (OT), 24 hour per day rehabilitation nursing, Therapeutic Recreaction (TR), Neuropsychology, Case Management (Social Worker), Rehabilitation Medicine, Nutrition Services and Pharmacy Services  Weekly team conferences will be held on Tuesdays to discuss your progress.  Your Social Worker will talk with you frequently to get your input and to update you on team discussions.  Team conferences with you and your family in attendance may also be held.  Expected length of stay: 10-12 days   Overall anticipated outcome: supervision  Depending on your progress and recovery, your program may change. Your Social Worker will coordinate services and will keep you informed of any changes. Your Social Worker's name and contact numbers are listed  below.  The following services may also be recommended but are not provided by the [facility] will be made to provide these services after discharge if needed.  Arrangements include referral to agencies that provide these services.  Your insurance has been verified to be:  [employer] Your primary doctor is:  Librarian, academic  Pertinent information will be shared with your doctor and your insurance company.  Social  Worker:  [place], [place] or ([phone]   Information discussed with and copy given to patient by: Lennart Pall, 01/10/2019, 3:10 PM

## 2019-01-10 NOTE — Progress Notes (Signed)
Occupational Therapy Session Note  Patient Details  Name: [name] MRN: [id number] Date of Birth: 1965/12/28  Today's Date: 01/10/2019 OT Individual Time: 0086-7619 OT Individual Time Calculation (min): 40 min    Short Term Goals: Week 1:  OT Short Term Goal 1 (Week 1): Pt will transfer to [facility] wiht LRAD and MIN A OT Short Term Goal 2 (Week 1): Pt will thread BLE into pants with AE PRN OT Short Term Goal 3 (Week 1): Pt will complete lateral leans wiht S on commode for 1/3 steps of toileting OT Short Term Goal 4 (Week 1): Pt will advance pants past hips wiht S    Skilled Therapeutic Interventions/Progress Updates:    Upon entering the room, pt supine in bed with no c/o pain and agreeable to OT intervention. Pt verbalizing need for toileting this session with focus on slide board transfer and lateral leans. Pt required cuing for correct slide board placement and set up of drop arm commode chair. Pt transferring onto commode with min guard for safety. Pt performed lateral leans to doff clothing items and able to void and have BM. Pt performed hygiene with set up A to obtain needed laterals and min guard for safety with clothing management. OT steadying the board and pt returning to sit on EOB. Pt engaged in 3 sets of 10 chest pulls, shoulder diagonals, and alternating punches with use of level 1 theraband with min verbal cuing for technique. Rest breaks as needed secondary to fatigue. Call bell and all needed items within reach and bed alarm activated.   Therapy Documentation Precautions:  Precautions Precautions: Fall Precaution Comments: R BKA wound vac Required Braces or Orthoses: Other Brace Restrictions Weight Bearing Restrictions: Yes RLE Weight Bearing: Non weight bearing Vital Signs: Therapy Vitals Temp: 98.7 F (37.1 C) Temp Source: Oral Pulse Rate: 72 Resp: 18 BP: 136/67 Patient Position (if appropriate): Lying Oxygen Therapy SpO2: 100 % O2 Device: Room  Air ADL: ADL Upper Body Bathing: Supervision/safety Where Assessed-Upper Body Bathing: Edge of bed Lower Body Bathing: Supervision/safety Where Assessed-Lower Body Bathing: Edge of bed Upper Body Dressing: Supervision/safety Where Assessed-Upper Body Dressing: Edge of bed Toilet Transfer: Moderate assistance Toilet Transfer Method: Squat pivot Toilet Transfer Equipment: Bedside commode   Therapy/Group: Individual Therapy  Gypsy Decant 01/10/2019, 8:23 AM

## 2019-01-10 NOTE — Plan of Care (Signed)
  Problem: RH BOWEL ELIMINATION Goal: RH STG MANAGE BOWEL WITH ASSISTANCE Description STG Manage Bowel with Assistance. Min  Outcome: Progressing Goal: RH STG MANAGE BOWEL W/MEDICATION W/ASSISTANCE Description STG Manage Bowel with Medication with Assistance. Min  Outcome: Progressing   Problem: RH BLADDER ELIMINATION Goal: RH STG MANAGE BLADDER WITH ASSISTANCE Description STG Manage Bladder With Assistance min  Outcome: Progressing   Problem: RH SKIN INTEGRITY Goal: RH STG MAINTAIN SKIN INTEGRITY WITH ASSISTANCE Description STG Maintain Skin Integrity With Assistance. min  Outcome: Progressing

## 2019-01-10 NOTE — Progress Notes (Signed)
Occupational Therapy Session Note  Patient Details  Name: [name] MRN: [id number] Date of Birth: June 28, 1966  Today's Date: 01/10/2019 OT Individual Time: 2957-4734 OT Individual Time Calculation (min): 30 min    Short Term Goals: Week 1:  OT Short Term Goal 1 (Week 1): Pt will transfer to [facility] wiht LRAD and MIN A OT Short Term Goal 2 (Week 1): Pt will thread BLE into pants with AE PRN OT Short Term Goal 3 (Week 1): Pt will complete lateral leans wiht S on commode for 1/3 steps of toileting OT Short Term Goal 4 (Week 1): Pt will advance pants past hips wiht S   Skilled Therapeutic Interventions/Progress Updates:    Session focused on grooming tasks and R residual limb desensitization techniques. Pt received in w/c with no c/o pain. Set up assist provided for pt to complete grooming tasks at sink seated. Discussed home accessibility and d/c planning with pt as she completed oral care. Pt was guided throughout tapping and rubbing residual limb and edu re desensitization principles. Pt left supine with lab tech present, all needs met.  Therapy Documentation Precautions:  Precautions Precautions: Fall Precaution Comments: R BKA wound vac Required Braces or Orthoses: Other Brace Restrictions Weight Bearing Restrictions: Yes RLE Weight Bearing: Non weight bearing  Therapy/Group: Individual Therapy  Curtis Sites 01/10/2019, 12:09 PM

## 2019-01-11 ENCOUNTER — Ambulatory Visit: Payer: BLUE CROSS/BLUE SHIELD | Admitting: *Deleted

## 2019-01-11 ENCOUNTER — Inpatient Hospital Stay (HOSPITAL_COMMUNITY): Payer: BLUE CROSS/BLUE SHIELD | Admitting: Physical Therapy

## 2019-01-11 ENCOUNTER — Inpatient Hospital Stay (HOSPITAL_COMMUNITY): Payer: BLUE CROSS/BLUE SHIELD

## 2019-01-11 LAB — GLUCOSE, CAPILLARY
Glucose-Capillary: 131 mg/dL — ABNORMAL HIGH (ref 70–99)
Glucose-Capillary: 107 mg/dL — ABNORMAL HIGH (ref 70–99)
Glucose-Capillary: 237 mg/dL — ABNORMAL HIGH (ref 70–99)
Glucose-Capillary: 241 mg/dL — ABNORMAL HIGH (ref 70–99)
Glucose-Capillary: 145 mg/dL — ABNORMAL HIGH (ref 70–99)

## 2019-01-11 NOTE — Plan of Care (Signed)
  Problem: RH BOWEL ELIMINATION Goal: RH STG MANAGE BOWEL WITH ASSISTANCE Description STG Manage Bowel with Assistance. Min  Outcome: Progressing Goal: RH STG MANAGE BOWEL W/MEDICATION W/ASSISTANCE Description STG Manage Bowel with Medication with Assistance. Min  Outcome: Progressing   Problem: RH BLADDER ELIMINATION Goal: RH STG MANAGE BLADDER WITH ASSISTANCE Description STG Manage Bladder With Assistance min  Outcome: Progressing   Problem: RH SKIN INTEGRITY Goal: RH STG SKIN FREE OF INFECTION/BREAKDOWN Description Free of skin breakdown and infection with min assist  Outcome: Progressing Goal: RH STG MAINTAIN SKIN INTEGRITY WITH ASSISTANCE Description STG Maintain Skin Integrity With Assistance. min  Outcome: Progressing   Problem: RH PAIN MANAGEMENT Goal: RH STG PAIN MANAGED AT OR BELOW PT'S PAIN GOAL Description Less than 3  Outcome: Progressing

## 2019-01-11 NOTE — Progress Notes (Signed)
Occupational Therapy Session Note  Patient Details  Name: [name] MRN: [id number] Date of Birth: 08/11/1966  Today's Date: 01/11/2019 OT Individual Time: 1230-1330 OT Individual Time Calculation (min): 60 min    Short Term Goals: Week 1:  OT Short Term Goal 1 (Week 1): Pt will transfer to [facility] wiht LRAD and MIN A OT Short Term Goal 2 (Week 1): Pt will thread BLE into pants with AE PRN OT Short Term Goal 3 (Week 1): Pt will complete lateral leans wiht S on commode for 1/3 steps of toileting OT Short Term Goal 4 (Week 1): Pt will advance pants past hips wiht S   Skilled Therapeutic Interventions/Progress Updates:    Pt received sitting in w/c, self reporting being "in a bad mood" but willing to work with OT. Reports pain 4/10 soreness in residual limb. Pt propelled w/c to therapy gym after declining any ADLs. Pt lateral scooted to therapy mat with CGA. Pt completed simulated LB dressing with lateral leans with CGA. Pt then completed several sit <> stands from EOM with RW, with 1 UE completing functional reaching. Pt required min A for balance support when removing hand from RW. Pt then transitioned to prone and was edu on importance of positioning of residual limb for future prosthesis. Pt completed posterior chain strengthening exercises with demo provided throughout. Pt returned to sitting EOM and completed BUE strengthening exercises with a focus on movements associated with ADL transfers. Pt reported need to use bathroom and returned to w/c. Pt completed w/c propulsion back to room and transferred to BSC over toilet with cueing for grab bar use and CGA overall. Min A required for clothing management standing. Pt was given a minute of privacy and she flushed toilet telling OT she had a BM, however no evidence of such. Pt returned to sitting in w/c and was left with chair alarm belt fastened and all needs met.   Therapy Documentation Precautions:  Precautions Precautions: Fall Precaution  Comments: R BKA wound vac Required Braces or Orthoses: Other Brace Other Brace: Limb protector Restrictions Weight Bearing Restrictions: Yes RLE Weight Bearing: Non weight bearing   Therapy/Group: Individual Therapy  Sandra H Davis 01/11/2019, 7:13 AM 

## 2019-01-11 NOTE — Progress Notes (Signed)
Pt appeared lethargic and dosing off during assessment. Pt stated therapy was very difficult on 5/4. Pt denied being in pain, no signs of distress at this time.

## 2019-01-11 NOTE — Progress Notes (Signed)
Physical Therapy Session Note  Patient Details  Name: [name] MRN: [id number] Date of Birth: 1966-06-12  Today's Date: 01/11/2019 PT Individual Time: 0920-1030 and 1118-1200 and 1455-1523 PT Individual Time Calculation (min): 70 min and 42 min and 28 min  Short Term Goals: Week 1:  PT Short Term Goal 1 (Week 1): pt will perform functional transfers with min A PT Short Term Goal 2 (Week 1): pt will perform w/c parts management with supervision  Skilled Therapeutic Interventions/Progress Updates:  Treatment 1: Pt received in w/c, attempting to place R amputee support pad on w/c & given extra time & instructional cuing to do so. Pt reports 5/10 pain on distal portion of residual limb & pain meds requested & administered from RN. Pt also c/o R phantom limb pain with therapist educating pt on need to perform desensitization techniques and purpose of them with pt return demonstrating with cuing. Pt dons L PRAFO with supervision and significantly extra time - educated pt to wear L PRAFO at all times unless bathing/dressing and pt may have increased ease of donning shoe in bed as pt unable to move LLE in figure-four position when sitting in w/c. Therapist adjusted height of R amputee support pad to promote further R knee extension while pt at rest in w/c. Educated pt on need to promote knee extension for future prosthetic use & pt performs RLE quad sets with multimodal cuing for technique. Pt performs hand hygiene at sink with mod I & propels w/c around unit with BUE & distant supervision with rest breaks PRN but pt able to propel room>ortho gym with only 1 rest break today, which is an improvement in endurance. Pt sets up w/c for car transfer with significantly extra time & supervision - pt demonstrates impaired memory of where to place hands on parts to swing leg rests out of the way & requires extra time for trial & error. Pt continues to demonstrate impaired recall of how to manage armrest as well,  requiring max cuing, despite therapist educating her multiple times yesterday. Pt completes car transfer via lateral scoot to sedan height simulator car with supervision overall. Pt propels w/c up/down ramp with only close supervision on this date & instructional cuing to recall need for anterior weight shifting to offset posterior COG when propelling up ramp. Pt utilized cybex kinetron from w/c level with LLE with therapist controlling contralateral pedal with task focusing on LLE strengthening. At end of session pt left sitting in w/c with chair alarm donned & all needs at hand.   Pt demonstrates impaired memory/recall of instructions given from day to day, as well as within a short moment of time, therefore this therapist is recommending pt d/c home with supervision 2/2 memory & awareness deficits.    Treatment 2: Pt received in w/c & agreeable to tx. Pt reports 6/10 pain in R residual limb following ace wrapping recently adjusted on limb - pt was premedicated during earlier session. Pt propels w/c around unit with BUE & distant supervision. Pt is able to set up w/c for transfer without any cuing/assistance and less time, and pt transfers w/c<>mat table via lateral scoot with supervision. While sitting EOM without BLE support pt performs PNF diagonals with weighted ball through comfortable range for LUE with task focusing on core & BUE strengthening & sitting balance. Pt utilized 4# weighted bar & performed chest presses, with two 6# dumbbells pt performed overhead press and bicep curls with cuing for technique. Pt transferred sit<>stand from [facility]  with min assist fade to supervision with pt pushing posteriorly on mat table with LLE for support/stability despite cuing to limit this, and cuing for hand placement. Pt is able to tolerate standing <20 seconds + 40 seconds + 20 seconds with BUE support on RW and close supervision for standing balance with pt denying LLE pain during activity & task focusing on LLE  strengthening through weight bearing. At end of session pt left sitting in w/c with chair alarm donned & all needs at hand.     Treatment 3: Pt received in w/c & agreeable to tx. Pt reports 5/10 pain in posterior R residual limb but declines asking for pain medication. Pt propels w/c room<>dayroom with supervision and requires min cuing to ensure w/c brakes are locked. Pt transfers sit<>stand with min assist to high/low table and tolerates standing 2 minutes + 2 1/2 minutes + 1 minute with CGA while engaging in linear puzzle with task focusing on standing balance and LLE strengthening via weight bearing. Pt continues to require extra time to manage w/c parts, especially placing legrests/amputee pad on the w/c. At end of session pt left sitting in w/c with chair alarm donned & all needs at hand, NT in room. At end of session pt reports "pinching" sensation in posterior R residual limb & therapist re wrapped it with ace wrap with pt reporting she no longer feels pinching sensation.  Educated pt on estimated d/c date per team conference discussion.     Therapy Documentation Precautions:  Precautions Precautions: Fall Precaution Comments: R BKA Required Braces or Orthoses: Other Brace Other Brace: Limb protector Restrictions Weight Bearing Restrictions: Yes RLE Weight Bearing: Non weight bearing    Therapy/Group: Individual Therapy  Waunita Schooner 01/11/2019, 3:25 PM

## 2019-01-11 NOTE — Progress Notes (Signed)
[facility] PHYSICAL MEDICINE & REHABILITATION PROGRESS NOTE   Subjective/Complaints: Pt in bed. No issues overnight although LPN reported lethargy. Pt refusing insulin at times  ROS: Patient denies fever, rash, sore throat, blurred vision, nausea, vomiting, diarrhea, cough, shortness of breath or chest pain,   back pain, headache, or mood change.     Objective:   No results found. Recent Labs    01/10/19 1117  WBC 12.1*  HGB 9.4*  HCT 29.7*  PLT 699*   No results for input(s): NA, K, CL, CO2, GLUCOSE, BUN, CREATININE, CALCIUM in the last 72 hours.  Intake/Output Summary (Last 24 hours) at 01/11/2019 0903 Last data filed at 01/11/2019 0700 Gross per 24 hour  Intake 360 ml  Output -  Net 360 ml     Physical Exam: Vital Signs Blood pressure (!) 162/84, pulse 76, temperature 97.7 F (36.5 C), temperature source Oral, resp. rate 18, height 5\' 3"  (1.6 m), weight 65 kg, last menstrual period 02/12/2011, SpO2 100 %. Constitutional: No distress . Vital signs reviewed. HEENT: EOMI, oral membranes moist Neck: supple Cardiovascular: RRR without murmur. No JVD    Respiratory: CTA Bilaterally without wheezes or rales. Normal effort    GI: BS +, non-tender, non-distended  Musc: Right stump with tenderness Neurological: She is alert.  She follows full commands.  Motor: Bilateral upper extremities: 4+/5 proximal distal Left lower extremity: 4+/5 proximal to distal, stable Right lower extremity: Hip flexion 4/5 stable Skin: Amputation site CDI, small areas of bruising/non-viable tissue along incision without change Psychiatric: pleasant  Assessment/Plan: 1. Functional deficits secondary to Right BKA which require 3+ hours per day of interdisciplinary therapy in a comprehensive inpatient rehab setting.  Physiatrist is providing close team supervision and 24 hour management of active medical problems listed below.  Physiatrist and rehab team continue to assess barriers to  discharge/monitor patient progress toward functional and medical goals  Care Tool:  Bathing    Body parts bathed by patient: Right arm, Left arm, Chest, Abdomen, Front perineal area, Buttocks, Right upper leg, Left upper leg, Face   Body parts bathed by helper: Left lower leg Body parts n/a: Right lower leg   Bathing assist Assist Level: Minimal Assistance - Patient > 75%     Upper Body Dressing/Undressing Upper body dressing   What is the patient wearing?: Pull over shirt    Upper body assist Assist Level: Supervision/Verbal cueing    Lower Body Dressing/Undressing Lower body dressing      What is the patient wearing?: Ace wrap/stump shrinker     Lower body assist Assist for lower body dressing: Supervision/Verbal cueing     Toileting Toileting    Toileting assist Assist for toileting: Contact Guard/Touching assist     Transfers Chair/bed transfer  Transfers assist     Chair/bed transfer assist level: Contact Guard/Touching assist     Locomotion Ambulation   Ambulation assist   Ambulation activity did not occur: Safety/medical concerns(blister on Lt heel, Rt BKA)          Walk 10 feet activity   Assist           Walk 50 feet activity   Assist           Walk 150 feet activity   Assist           Walk 10 feet on uneven surface  activity   Assist           Wheelchair     Assist Will patient  use wheelchair at discharge?: Yes Type of Wheelchair: Manual    Wheelchair assist level: Supervision/Verbal cueing Max wheelchair distance: 150 ft    Wheelchair 50 feet with 2 turns activity    Assist        Assist Level: Supervision/Verbal cueing   Wheelchair 150 feet activity     Assist Wheelchair 150 feet activity did not occur: Safety/medical concerns   Assist Level: Supervision/Verbal cueing    Medical Problem List and Plan: 1.  Decreased functional mobility secondary to right BKA on 12/31/2018.  Wound VAC as directed.  Continue CIR, team conf today 2.  Antithrombotics: -DVT/anticoagulation:  SCD left lower extremity             -antiplatelet therapy: aspirin 81 mg daily 3. Pain Management:   -Neurontin 300 mg 3 times a day  -Robaxin as needed.patient  -no narcotics at this time due to increased sensitivity to pain medication  Elavil 5 mg started on 5/2 for phantom limb pain, predominantly at night-effective---causing PM sedation??? Seemed alert this morning 4. Mood: Lexapro 10 mg daily             -antipsychotic agents: N/A 5. Neuropsych: This patient is not yet fully capable of making decisions on her own behalf. 6. Skin/Wound Care:  Routine skin checks.    VAC DC'd on 5/3---dressing re-applied this morning  -  PRAFO boot for further protection of left heel, transfers  -foam dressing for heel sore 7. Fluids/Electrolytes/Nutrition:  Routine ins and outs 8. Acute on chronic anemia.   Hemoglobin up to 9.4 on 5/4 9. Diabetes mellitus peripheral neuropathy. Hemoglobin A1c 10.5.  -NovoLog 4 units 3 times a day  Lantus insulin 40 units daily at bedtime.,  Decreased to 35, decreased to 20 on 5/3---decrease to 15u tonight. Consider am lantus   -sugars showing some improvement  -observe for any med changes 10. CAD with CABG. Continue aspirin. No chest pain or shortness of breath 11. Chronic diastolic congestive heart failure.  Monitor for signs and symptoms of fluid overload.  Filed Weights   01/09/19 0618 01/10/19 0528 01/11/19 0633  Weight: 66.7 kg 65.8 kg 65 kg   Stable on 5/5 12. Hypertension. Imdur 15 mg daily, Norvasc 5 mg daily.  Monitor with increased mobility.  Controlled 5/5 13. Hyperlipidemia. Lipitor 14. Hypothyroidism. Synthroid 15.Chronic kidney disease. Creatinine baseline 1.32-2.04.  Creatinine 1.29 on 4/30  Continue to monitor 16. Constipation. Laxative assistance 17. Nausea:?  Improving  Continue meds  LOS: 6 days A FACE TO FACE EVALUATION WAS  PERFORMED  Meredith Staggers 01/11/2019, 9:03 AM

## 2019-01-12 ENCOUNTER — Inpatient Hospital Stay (HOSPITAL_COMMUNITY): Payer: BLUE CROSS/BLUE SHIELD | Admitting: Occupational Therapy

## 2019-01-12 ENCOUNTER — Ambulatory Visit: Payer: BLUE CROSS/BLUE SHIELD | Admitting: *Deleted

## 2019-01-12 ENCOUNTER — Inpatient Hospital Stay (HOSPITAL_COMMUNITY): Payer: BLUE CROSS/BLUE SHIELD | Admitting: *Deleted

## 2019-01-12 ENCOUNTER — Inpatient Hospital Stay (HOSPITAL_COMMUNITY): Payer: BLUE CROSS/BLUE SHIELD | Admitting: Physical Therapy

## 2019-01-12 LAB — GLUCOSE, CAPILLARY
Glucose-Capillary: 153 mg/dL — ABNORMAL HIGH (ref 70–99)
Glucose-Capillary: 75 mg/dL (ref 70–99)
Glucose-Capillary: 107 mg/dL — ABNORMAL HIGH (ref 70–99)
Glucose-Capillary: 160 mg/dL — ABNORMAL HIGH (ref 70–99)

## 2019-01-12 MED ORDER — INSULIN GLARGINE 100 UNIT/ML ~~LOC~~ SOLN
5.0000 [IU] | Freq: Every day | SUBCUTANEOUS | Status: DC
  Administered 2019-01-12 – 2019-01-15 (×4): 5 [IU] via SUBCUTANEOUS
  Filled 2019-01-12 (×5): qty 0.05

## 2019-01-12 NOTE — Progress Notes (Signed)
Physical Therapy Session Note  Patient Details  Name: [name] MRN: [id number] Date of Birth: 14-Aug-1966  Today's Date: 01/12/2019 PT Individual Time: 9373-4287 PT Individual Time Calculation (min): 70 min   Short Term Goals: Week 1:  PT Short Term Goal 1 (Week 1): pt will perform functional transfers with min A PT Short Term Goal 2 (Week 1): pt will perform w/c parts management with supervision  Skilled Therapeutic Interventions/Progress Updates:  Pt received in w/c & agreeable to tx. Pt reports unrated soreness in R residual limb but does not voice need for pain medication. Pt reports her son has not measured doorways to ensure w/c will fit and therapist educated her on importance of son obtaining these. Pt propels w/c around unit with BUE & distant supervision with rest breaks PRN. Pt set up w/c for car transfer without cuing & less time required to do so on this date. Pt performed car transfer via squat pivot with close supervision. Pt set up w/c for transfer to nu-step with max cuing for positioning of w/c but completes transfer with supervision overall. Pt utilized nu-step on level 3 x 10 minutes with task focusing on global strengthening & endurance training. Pt requests to work on strengthening her upper body therefore pt performed bicep curls & overhead press with 7# dumbbells with therapist providing max instructional cuing for technique & pt moving LUE through comfortable range when performing overhead press. At end of session pt left sitting in w/c with chair alarm donned & all needs at hand.  Reviewed need for pt to lie prone at least 10 minutes/day to stretch hip flexors and to perform pressure relief techniques if sitting in w/c >20 minutes - pt reports she has not been doing these despite previous education.   Therapy Documentation Precautions:  Precautions Precautions: Fall Required Braces or Orthoses: Other Brace Other Brace: Limb protector Restrictions Weight Bearing  Restrictions: Yes RLE Weight Bearing: Non weight bearing     Therapy/Group: Individual Therapy  Waunita Schooner 01/12/2019, 11:30 AM

## 2019-01-12 NOTE — Evaluation (Signed)
Recreational Therapy Assessment and Plan  Patient Details  Name: [name] MRN: [id number] Date of Birth: November 21, 1965 Today's Date: 01/12/2019  Rehab Potential:  Good ELOS:   discharge 5/9  Assessment Problem List:      Patient Active Problem List   Diagnosis Date Noted  . Right below-knee amputee ([place]) 01/05/2019  . Unilateral complete BKA, right, initial encounter ([place])   . CKD (chronic kidney disease), stage III ([place])   . Hypothyroidism   . Essential hypertension   . Chronic diastolic congestive heart failure ([place])   . Coronary artery disease involving coronary bypass graft of native heart without angina pectoris   . Poorly controlled type 2 diabetes mellitus with peripheral neuropathy ([place])   . Acute on chronic anemia   . History of below knee amputation, right ([place]) 12/31/2018  . Gangrene of right foot ([place])   . Amputation of right great toe ([place]) 12/22/2018  . Abscess of great toe, right   . Diabetic infection of right foot ([name])   . Cellulitis of foot, right 12/11/2018  . S/P CABG x 3 06/28/2018  . Coronary artery disease involving native coronary artery of native heart with angina pectoris ([place]) 06/25/2018  . Type 2 diabetes mellitus with vascular disease ([place]) 06/24/2018  . Acute on chronic diastolic CHF (congestive heart failure) ([place]) 06/24/2018  . Non-ST elevation (NSTEMI) myocardial infarction ([place]) 06/24/2018  . Chest pain 06/23/2018  . Glaucoma 09/17/2017  . Diabetic retinopathy ([place]) 09/17/2017  . Otitis media 11/10/2016  . Decreased visual acuity 11/10/2016  . Spinal stenosis, lumbar region with neurogenic claudication 10/22/2016  . Other spondylosis with radiculopathy, lumbar region 10/13/2016  . Lipoma of abdominal wall 10/05/2016  . Acute osteomyelitis of toe, left ([place]) 09/05/2016  . Severe protein-calorie malnutrition ([place]) 09/04/2016  . Anemia of chronic disease 09/04/2016  . Diabetic foot infection ([place]) 08/26/2016  . Diabetic  polyneuropathy associated with type 2 diabetes mellitus ([place]) 05/07/2016  . Orthostatic hypotension 05/07/2016  . Cerebrovascular disease 04/27/2016  . ICH (intracerebral hemorrhage) ([place]) 03/30/2016  . Visit for preventive health examination 05/21/2015  . Breast cancer screening 05/21/2015  . Arm lesion 07/02/2014  . Pain in joint, ankle and foot 04/28/2014  . Pain in limb 01/13/2014  . Neuropathic pain of both legs 01/13/2014  . Hip pain 01/12/2014  . Allergic rhinitis 03/10/2013  . Back pain 03/08/2013  . Depression 12/18/2012  . PVD (peripheral vascular disease) ([place]) 01/21/2012  . Wound of left leg 12/22/2011  . Hyperlipidemia 12/06/2010  . Overweight(278.02) 12/06/2010  . RESTLESS LEG SYNDROME 10/25/2010  . Migraine without aura 10/07/2010  . Hereditary and idiopathic peripheral neuropathy 10/07/2010  . Essential hypertension, benign 10/07/2010  . DISTURBANCE OF SKIN SENSATION 10/07/2010  . HEART MURMUR, HX OF 10/07/2010  . NEPHROLITHIASIS, HX OF 10/07/2010    Past Medical History:      Past Medical History:  Diagnosis Date  . Acute on chronic diastolic CHF (congestive heart failure) ([place]) 06/24/2018  . Anxiety   . COMMON MIGRAINE 10/07/2010  . Decreased visual acuity 11/10/2016  . Depression 12/18/2012  . Diabetes mellitus type II, uncontrolled ([place]) 10/07/2010   Qualifier: Diagnosis of  By: Charlett Blake MD, [name]    . Diabetic foot infection ([place]) 08/26/2016  . Disturbance of skin sensation 10/07/2010  . Gangrene of right foot ([place])   . Heart murmur   . History of kidney stones    "years ago"  . Hyperlipidemia 12/06/2010  . Hypertension   . Lipoma of abdominal  wall 10/05/2016  . Overweight(278.02) 12/06/2010  . PERIPHERAL NEUROPATHY, FEET 10/07/2010  . PVD (peripheral vascular disease) ([place]) 01/21/2012  . RESTLESS LEG SYNDROME 10/25/2010  . Stroke [facility]) 2014, 2017   most recently in 2/17 - intracerebral hemorrhage   Past Surgical History:       Past Surgical  History:  Procedure Laterality Date  . AMPUTATION Right 12/13/2018   Procedure: AMPUTATION RIGHT GREAT TOE, LOCAL RELOCATION OF TISSUE FOR WOUND CLOSURE 9cm x 3cm, VAC APPLICATION;  Surgeon: [name], MD;  Location: [place];  Service: Orthopedics;  Laterality: Right;  . AMPUTATION Right 12/31/2018   Procedure: RIGHT BELOW KNEE AMPUTATION;  Surgeon: [name], MD;  Location: [place];  Service: Orthopedics;  Laterality: Right;  . AMPUTATION TOE Left 09/09/2016   Procedure: AMPUTATION OF LEFT GREAT TOE;  Surgeon: Milly Jakob, MD;  Location: [name];  Service: Orthopedics;  Laterality: Left;  . CARDIAC CATHETERIZATION  06/24/2018  . CORONARY ARTERY BYPASS GRAFT N/A 06/28/2018   Procedure: CORONARY ARTERY BYPASS GRAFTING (CABG) times  four, using left internal mammary artery, endoscopically harvested right saphenous vein, and harvested left radial artery;  Surgeon: [name], MD;  Location: [place];  Service: Open Heart Surgery;  Laterality: N/A;  . ENDOVEIN HARVEST OF GREATER SAPHENOUS VEIN Right 06/28/2018   Procedure: ENDOVEIN HARVEST OF GREATER SAPHENOUS VEIN;  Surgeon: [name], MD;  Location: [place];  Service: Open Heart Surgery;  Laterality: Right;  . LEFT HEART CATH AND CORONARY ANGIOGRAPHY N/A 06/24/2018   Procedure: LEFT HEART CATH AND CORONARY ANGIOGRAPHY;  Surgeon: Troy Sine, MD;  Location: [place] [facility];  Service: Cardiovascular;  Laterality: N/A;  . RADIAL ARTERY HARVEST Left 06/28/2018   Procedure: RADIAL ARTERY HARVEST;  Surgeon: [name], MD;  Location: [place];  Service: Open Heart Surgery;  Laterality: Left;  . TEE WITHOUT CARDIOVERSION N/A 06/28/2018   Procedure: TRANSESOPHAGEAL ECHOCARDIOGRAM (TEE);  Surgeon: [name], MD;  Location: [place];  Service: Open Heart Surgery;  Laterality: N/A;  . WISDOM TOOTH EXTRACTION      Assessment & Plan Clinical Impression: Patient is a 53 y.o. year old  female with recent admission to the hospital on 12/31/2018 with right foot gangrene. She recently had a right first ray amputation. Noted wound dehiscence progressive cellulitis low-grade fever. Limb was not felt to be salvageable. Underwent right BKA on 12/31/2018 per Dr. [name] Given. Hospital course complicated by pain and hypokalemia. Potassium supplement added. On 01/01/2019 rapid response called due to decreased responsiveness and desaturations after receiving oxycodone. Patient did receive Narcan with good results. All narcotics of since been discontinued.Hospital course further complicated by acute on chronic anemia, hemoglobin 10.9 on 12/31/2018. Therapy evaluations completed patient was admitted for a comprehensive rehabilitation program.  Patient transferred to CIR on 01/05/2019 .   Pt presents with decreased activity tolerance, decreased functional mobility, decreased balance Limiting pt's independence with leisure/community pursuits.  Met with pt to discuss leisure interests.  Pt pleasant and talkative but unable to name any leisure interests stating that all she was focused on was returning to work and being able to take care of her elderly parents.  Stressed the importance of leisure and relaxation/stress management and it's impact on overall health & wellness.  Pt stated understanding but noncommittal to making changes.   Plan No further TR as pt is expected to discharge home 5/9 and pt with little interest  Recommendations for other services: Neuropsych  Discharge  Criteria: Patient will be discharged from TR if patient refuses treatment 3 consecutive times without medical reason.  If treatment goals not met, if there is a change in medical status, if patient makes no progress towards goals or if patient is discharged from hospital.  The above assessment, treatment plan, treatment alternatives and goals were discussed and mutually agreed upon: by patient  [place] 01/12/2019, 4:13 PM

## 2019-01-12 NOTE — Progress Notes (Signed)
Physical Therapy Session Note  Patient Details  Name: [name] MRN: [id number] Date of Birth: [birth year]  Today's Date: 01/12/2019 PT Individual Time: 1515-1540 PT Individual Time Calculation (min): 25 min   Short Term Goals: Week 1:  PT Short Term Goal 1 (Week 1): pt will perform functional transfers with min A PT Short Term Goal 2 (Week 1): pt will perform w/c parts management with supervision  Skilled Therapeutic Interventions/Progress Updates:   Pt in supine and agreeable to therapy, no c/o pain. Supine>sit and squat pivot transfer w/ close supervision and verbal cues for safety. Pt self-propelled w/c to/from therapy gym w/ supervision using BUEs to work on UE strengthening. Seated shoulder flexion raises w/ 2# dowel rod 2x10 and tossed beach ball back and forth w/ 2# dowel rod as well. Pt quickly fatigues after 30-60 sec of active UE movement. Educated on benefits of taking frequent but short rest breaks to work on endurance w/ all mobility. Returned to room and had pt practice w/c parts management w/ locking/unlocking brakes and donning/doffing leg rests. Needed min visual/verbal cues to problem solve, correct errors, and for technique. Ended session in w/c, all needs in reach.   Therapy Documentation Precautions:  Precautions Precautions: Fall Precaution Comments: R BKA wound vac Required Braces or Orthoses: Other Brace Other Brace: Limb protector Restrictions Weight Bearing Restrictions: Yes RLE Weight Bearing: Non weight bearing  Therapy/Group: Individual Therapy  Amy K Tally 01/12/2019, 4:01 PM

## 2019-01-12 NOTE — Progress Notes (Signed)
[facility] PHYSICAL MEDICINE & REHABILITATION PROGRESS NOTE   Subjective/Complaints: No major issues. States she slept ok. Pain under fair control  ROS: Patient denies fever, rash, sore throat, blurred vision, nausea, vomiting, diarrhea, cough, shortness of breath or chest pain,  headache, or mood change.    Objective:   No results found. Recent Labs    01/10/19 1117  WBC 12.1*  HGB 9.4*  HCT 29.7*  PLT 699*   No results for input(s): NA, K, CL, CO2, GLUCOSE, BUN, CREATININE, CALCIUM in the last 72 hours.  Intake/Output Summary (Last 24 hours) at 01/12/2019 0903 Last data filed at 01/11/2019 2300 Gross per 24 hour  Intake 360 ml  Output -  Net 360 ml     Physical Exam: Vital Signs Blood pressure (!) 156/81, pulse 87, temperature 98.7 F (37.1 C), temperature source Oral, resp. rate 16, height 5\' 3"  (1.6 m), weight 67.4 kg, last menstrual period 02/12/2011, SpO2 98 %. Constitutional: No distress . Vital signs reviewed. HEENT: EOMI, oral membranes moist Neck: supple Cardiovascular: RRR without murmur. No JVD    Respiratory: CTA Bilaterally without wheezes or rales. Normal effort    GI: BS +, non-tender, non-distended  Musc: Right stump with tenderness Neurological: She is alert.  She follows full commands.  Motor: Bilateral upper extremities: 4+/5 proximal distal Left lower extremity: 4+/5 proximal to distal, stable Right lower extremity: Hip flexion 4/5 stable Skin: Amputation site dressed. Not visualized today. Psychiatric: pleasant  Assessment/Plan: 1. Functional deficits secondary to Right BKA which require 3+ hours per day of interdisciplinary therapy in a comprehensive inpatient rehab setting.  Physiatrist is providing close team supervision and 24 hour management of active medical problems listed below.  Physiatrist and rehab team continue to assess barriers to discharge/monitor patient progress toward functional and medical goals  Care Tool:  Bathing     Body parts bathed by patient: Right arm, Left arm, Chest, Abdomen, Front perineal area, Buttocks, Right upper leg, Left upper leg, Face   Body parts bathed by helper: Left lower leg Body parts n/a: Right lower leg   Bathing assist Assist Level: Minimal Assistance - Patient > 75%     Upper Body Dressing/Undressing Upper body dressing   What is the patient wearing?: Pull over shirt    Upper body assist Assist Level: Supervision/Verbal cueing    Lower Body Dressing/Undressing Lower body dressing      What is the patient wearing?: Ace wrap/stump shrinker     Lower body assist Assist for lower body dressing: Supervision/Verbal cueing     Toileting Toileting    Toileting assist Assist for toileting: Contact Guard/Touching assist     Transfers Chair/bed transfer  Transfers assist     Chair/bed transfer assist level: Supervision/Verbal cueing     Locomotion Ambulation   Ambulation assist   Ambulation activity did not occur: Safety/medical concerns(blister on Lt heel, Rt BKA)          Walk 10 feet activity   Assist           Walk 50 feet activity   Assist           Walk 150 feet activity   Assist           Walk 10 feet on uneven surface  activity   Assist           Wheelchair     Assist Will patient use wheelchair at discharge?: Yes Type of Wheelchair: Manual    Wheelchair assist level:  Supervision/Verbal cueing Max wheelchair distance: 100 ft    Wheelchair 50 feet with 2 turns activity    Assist        Assist Level: Supervision/Verbal cueing   Wheelchair 150 feet activity     Assist Wheelchair 150 feet activity did not occur: Safety/medical concerns   Assist Level: Supervision/Verbal cueing    Medical Problem List and Plan: 1.  Decreased functional mobility secondary to right BKA on 12/31/2018. Wound VAC as directed.  -Continue CIR therapies including PT, OT   -WILL APPLY SHRINKER POTENTIALLY  TOMORROW DEPENDING UPON WOUND 2.  Antithrombotics: -DVT/anticoagulation:  SCD left lower extremity             -antiplatelet therapy: aspirin 81 mg daily 3. Pain Management:   -Neurontin 300 mg 3 times a day  -Robaxin as needed.patient  -no narcotics at this time due to increased sensitivity to pain medication  -DC Elavil as I think it's worsening memory/cognition     4. Mood: Lexapro 10 mg daily             -antipsychotic agents: N/A 5. Neuropsych: This patient is not yet fully capable of making decisions on her own behalf. 6. Skin/Wound Care:  Routine skin checks.    VAC DC'd on 5/3---SHRINKER TOMORROW  -  PRAFO boot for further protection of left heel, transfers  -foam dressing for heel sore as well 7. Fluids/Electrolytes/Nutrition:  Routine ins and outs 8. Acute on chronic anemia.   Hemoglobin up to 9.4 on 5/4 9. Diabetes mellitus peripheral neuropathy. Hemoglobin A1c 10.5.  -NovoLog 4 units 3 times a day  Lantus insulin 40 units daily at bedtime.,  Decreased to 35, decreased to 20 on 5/3---decrease to 15u tonight.   -added AM lantus 5u  beginning this morning  -discussed with patient that she needs to cooperate with regimen so that we can see what works for her and make appropriate changes  10. CAD with CABG. Continue aspirin. No chest pain or shortness of breath 11. Chronic diastolic congestive heart failure.  Monitor for signs and symptoms of fluid overload.  Filed Weights   01/10/19 0528 01/11/19 0633 01/12/19 0500  Weight: 65.8 kg 65 kg 67.4 kg   Stable on 5/6 12. Hypertension. Imdur 15 mg daily, Norvasc 5 mg daily.  Monitor with increased mobility.  Controlled 5/5 13. Hyperlipidemia. Lipitor 14. Hypothyroidism. Synthroid 15.Chronic kidney disease. Creatinine baseline 1.32-2.04.  Creatinine 1.29 on 4/30  Continue to monitor 16. Constipation. Laxative assistance 17. Nausea:?  Improving  Continue meds  LOS: 7 days A FACE TO FACE EVALUATION WAS PERFORMED  Meredith Staggers 01/12/2019, 9:03 AM

## 2019-01-12 NOTE — Plan of Care (Signed)
  Problem: RH BOWEL ELIMINATION Goal: RH STG MANAGE BOWEL WITH ASSISTANCE Description STG Manage Bowel with Assistance. Min  Outcome: Progressing Goal: RH STG MANAGE BOWEL W/MEDICATION W/ASSISTANCE Description STG Manage Bowel with Medication with Assistance. Min  Outcome: Progressing   Problem: RH BLADDER ELIMINATION Goal: RH STG MANAGE BLADDER WITH ASSISTANCE Description STG Manage Bladder With Assistance min  Outcome: Progressing   Problem: RH SKIN INTEGRITY Goal: RH STG SKIN FREE OF INFECTION/BREAKDOWN Description Free of skin breakdown and infection with min assist  Outcome: Progressing Goal: RH STG MAINTAIN SKIN INTEGRITY WITH ASSISTANCE Description STG Maintain Skin Integrity With Assistance. min  Outcome: Progressing   Problem: RH PAIN MANAGEMENT Goal: RH STG PAIN MANAGED AT OR BELOW PT'S PAIN GOAL Description Less than 3  Outcome: Progressing

## 2019-01-12 NOTE — Progress Notes (Signed)
Occupational Therapy Session Note  Patient Details  Name: [name] MRN: [id number] Date of Birth: 05/01/1966  Today's Date: 01/12/2019 OT Individual Time: 6808-8110 OT Individual Time Calculation (min): 45 min    Short Term Goals: Week 1:  OT Short Term Goal 1 (Week 1): Pt will transfer to [facility] wiht LRAD and MIN A OT Short Term Goal 2 (Week 1): Pt will thread BLE into pants with AE PRN OT Short Term Goal 3 (Week 1): Pt will complete lateral leans wiht S on commode for 1/3 steps of toileting OT Short Term Goal 4 (Week 1): Pt will advance pants past hips wiht S   Skilled Therapeutic Interventions/Progress Updates:    Treatment session with focus on functional transfers, lateral leans for toileting, and BUE strengthening.  Pt received on BSC.  Engaged in lateral leans for hygiene and clothing management with pt able to pull pants over hips with increased time and weight shifts.  CGA lateral scoot transfer back to bed to allow pt to fully adjust pants in back.  Pt requested to remain in room for therapy session.  Engaged in [place] with use of theraband with focus on increased tricep strengthening as needed for functional transfers and clothing management.  Utilized 4# medicine ball with pt completing chest presses and overhead presses.  Pt reports BUE fatigue. Nurse tech present during discussion and notified RN of pt request.  Lateral scoot bed > w/c Supervision.  Pt left upright in w/c with seat belt alarm on and all needs in reach.  Discussed d/c plan, with pt reporting that her son will be "on a job" Friday and requesting to d/c home Sat when he returns.    Therapy Documentation Precautions:  Precautions Precautions: Fall Precaution Comments: R BKA wound vac Required Braces or Orthoses: Other Brace Other Brace: Limb protector Restrictions Weight Bearing Restrictions: Yes RLE Weight Bearing: Non weight bearing Pain: Pain Assessment Pain Scale: 0-10 Pain Score: 4   Pain Type: Acute pain;Surgical pain Pain Location: Leg Pain Orientation: Right Pain Descriptors / Indicators: Aching Pain Frequency: Intermittent Pain Onset: On-going Pain Intervention(s): Medication (See eMAR)(tylenol given)   Therapy/Group: Individual Therapy  HOXIE, [place] 01/12/2019, 10:10 AM

## 2019-01-12 NOTE — Progress Notes (Signed)
Occupational Therapy Session Note  Patient Details  Name: [name] MRN: [id number] Date of Birth: Mar 14, 1966  Today's Date: 01/12/2019 OT Individual Time: 0700-0727 OT Individual Time Calculation (min): 27 min    Short Term Goals: Week 1:  OT Short Term Goal 1 (Week 1): Pt will transfer to [facility] wiht LRAD and MIN A OT Short Term Goal 2 (Week 1): Pt will thread BLE into pants with AE PRN OT Short Term Goal 3 (Week 1): Pt will complete lateral leans wiht S on commode for 1/3 steps of toileting OT Short Term Goal 4 (Week 1): Pt will advance pants past hips wiht S   Skilled Therapeutic Interventions/Progress Updates:    Upon entering the room, pt supine in bed and reports , " I didn't sleep at all last night." Pt initially declined OT intervention and therapist encouraged pt to participate. Pt performed supine >sit with supervision to EOB. Pt eating breakfast while OT began energy conservation education for discharge. Pt was active participant and handouts provided for family members. Pt declined further intervention and returning to supine. Bed alarm activated and call bell within reach.   Therapy Documentation Precautions:  Precautions Precautions: Fall Precaution Comments: R BKA wound vac Required Braces or Orthoses: Other Brace Other Brace: Limb protector Restrictions Weight Bearing Restrictions: Yes RLE Weight Bearing: Non weight bearing General:   Vital Signs: Therapy Vitals Temp: 98.7 F (37.1 C) Temp Source: Oral Pulse Rate: 87 Resp: 16 BP: (!) 156/81 Patient Position (if appropriate): Sitting Oxygen Therapy SpO2: 98 % O2 Device: Room Air ADL: ADL Upper Body Bathing: Supervision/safety Where Assessed-Upper Body Bathing: Edge of bed Lower Body Bathing: Supervision/safety Where Assessed-Lower Body Bathing: Edge of bed Upper Body Dressing: Supervision/safety Where Assessed-Upper Body Dressing: Edge of bed Toilet Transfer: Moderate assistance Toilet Transfer  Method: Squat pivot Toilet Transfer Equipment: Bedside commode   Therapy/Group: Individual Therapy  Gypsy Decant 01/12/2019, 7:28 AM

## 2019-01-12 NOTE — Patient Care Conference (Signed)
Inpatient RehabilitationTeam Conference and Plan of Care Update Date: 01/11/2019   Time: 2:35 PM    Patient Name: [name]      Medical Record Number: [id number]  Date of Birth: 06-Jul-1966 Sex: Female         Room/Bed: 4W13C/4W13C-01 Payor Info: Payor: BLUE CROSS BLUE SHIELD / Plan: BCBS OTHER / Product Type: *No Product type* /    Admitting Diagnosis: R BKA w wound vac  Admit Date/Time:  01/05/2019  5:02 PM Admission Comments: No comment available   Primary Diagnosis:  <principal problem not specified> Principal Problem: <principal problem not specified>  Patient Active Problem List   Diagnosis Date Noted  . Hypoglycemia   . Diabetes mellitus type 2 in nonobese (HCC)   . Labile blood pressure   . Labile blood glucose   . Phantom limb pain ([place])   . Right below-knee amputee ([place]) 01/05/2019  . Unilateral complete BKA, right, initial encounter ([place])   . CKD (chronic kidney disease), stage III ([place])   . Hypothyroidism   . Essential hypertension   . Chronic diastolic congestive heart failure ([place])   . Coronary artery disease involving coronary bypass graft of native heart without angina pectoris   . Poorly controlled type 2 diabetes mellitus with peripheral neuropathy ([place])   . Acute on chronic anemia   . History of below knee amputation, right ([place]) 12/31/2018  . Gangrene of right foot ([place])   . Amputation of right great toe ([place]) 12/22/2018  . Abscess of great toe, right   . Diabetic infection of right foot ([place])   . Cellulitis of foot, right 12/11/2018  . S/P CABG x 3 06/28/2018  . Coronary artery disease involving native coronary artery of native heart with angina pectoris ([place]) 06/25/2018  . Type 2 diabetes mellitus with vascular disease ([place]) 06/24/2018  . Acute on chronic diastolic CHF (congestive heart failure) ([place]) 06/24/2018  . Non-ST elevation (NSTEMI) myocardial infarction ([place]) 06/24/2018  . Chest pain 06/23/2018  . Glaucoma 09/17/2017  . Diabetic  retinopathy ([place]) 09/17/2017  . Otitis media 11/10/2016  . Decreased visual acuity 11/10/2016  . Spinal stenosis, lumbar region with neurogenic claudication 10/22/2016  . Other spondylosis with radiculopathy, lumbar region 10/13/2016  . Lipoma of abdominal wall 10/05/2016  . Acute osteomyelitis of toe, left ([place]) 09/05/2016  . Severe protein-calorie malnutrition ([place]) 09/04/2016  . Anemia of chronic disease 09/04/2016  . Diabetic foot infection ([place]) 08/26/2016  . Diabetic polyneuropathy associated with type 2 diabetes mellitus ([place]) 05/07/2016  . Orthostatic hypotension 05/07/2016  . Cerebrovascular disease 04/27/2016  . ICH (intracerebral hemorrhage) ([place]) 03/30/2016  . Visit for preventive health examination 05/21/2015  . Breast cancer screening 05/21/2015  . Arm lesion 07/02/2014  . Pain in joint, ankle and foot 04/28/2014  . Pain in limb 01/13/2014  . Neuropathic pain of both legs 01/13/2014  . Hip pain 01/12/2014  . Allergic rhinitis 03/10/2013  . Back pain 03/08/2013  . Depression 12/18/2012  . PVD (peripheral vascular disease) ([place]) 01/21/2012  . Wound of left leg 12/22/2011  . Hyperlipidemia 12/06/2010  . Overweight(278.02) 12/06/2010  . RESTLESS LEG SYNDROME 10/25/2010  . Migraine without aura 10/07/2010  . Hereditary and idiopathic peripheral neuropathy 10/07/2010  . Essential hypertension, benign 10/07/2010  . DISTURBANCE OF SKIN SENSATION 10/07/2010  . HEART MURMUR, HX OF 10/07/2010  . NEPHROLITHIASIS, HX OF 10/07/2010    Expected Discharge Date: Expected Discharge Date: 01/15/19  Team Members Present: Physician leading conference: Dr. Alger Simons Social Worker  Present: Lennart Pall, LCSW Nurse Present: Dwaine Gale, RN PT Present: Lavone Nian, PT OT Present: Other (comment)(Sandra Rosana Hoes, OT) SLP Present: Weston Anna, SLP PPS Coordinator present : Gunnar Fusi     Current Status/Progress Goal Weekly Team Focus  Medical   right BKA, pain  controlled. left heel wound--pressure relief, DM mgt---sugars dropping  improve pain control activity tolerance  improve wound pain control, prosthetic education, coping skills/ed   Bowel/Bladder   Continent of b/b, LBM 01/09/19  Remain continent of b/b  Assist with toilting PRN/Q Shift   Swallow/Nutrition/ Hydration             ADL's   CGA LB dressing, (S) UB dressing, CGA lateral scoots  Supervision ADLs and transfers  w/c propulsion, BUE strengthening, ADL retraining, standing balance   Mobility   supervision bed mobility, mod assist sit<>stand, min assist toilet transfer, min assist car transfer, supervision w/c mobility, impaired awareness, cuing to manage w/c parts  supervision overall from w/c level except min assist car transfer  w/c parts management, transfers (especially car transfer), w/c mobility, d/c planning, pt education   Communication             Safety/Cognition/ Behavioral Observations            Pain   General Discomfort, releived with tylenol  Free of Pain  Assess pain q-shift/prn and medicate   Skin   R-Stump w/wound vac- 125 suction, L Heel- Stage 2, NaCl rince with foam dressing; changed daily.  Healing of current skin deficits, no further skin breakdown or infection  Assess skin q-shift/PRN    Rehab Goals Patient on target to meet rehab goals: Yes *See Care Plan and progress notes for long and short-term goals.     Barriers to Discharge  Current Status/Progress Possible Resolutions Date Resolved   Physician    Medical stability;Wound Care        education, medical mgt      Nursing                  PT  Decreased caregiver support;Wound Care                 OT                  SLP                SW Lack of/limited family support(will need to be able to coordinate 2/47 coverage) Will discuss further with pt, family and team about supervision goals            Discharge Planning/Teaching Needs:  Plan to return home with son and dtr-in-law providing  24/7 support initially.  Need to schedule teaching prior to d/c.   Team Discussion:  Left heel wound - pressure relief;  BS more stable.  Pt resistent to looking at wound.  Currently CGA overall with supervision w/c level goals.  Showing some memory deficits.  Review of DME and f/u needs.  Revisions to Treatment Plan:  NA    Continued Need for Acute Rehabilitation Level of Care: The patient requires daily medical management by a physician with specialized training in physical medicine and rehabilitation for the following conditions: Daily direction of a multidisciplinary physical rehabilitation program to ensure safe treatment while eliciting the highest outcome that is of practical value to the patient.: Yes Daily medical management of patient stability for increased activity during participation in an intensive rehabilitation regime.: Yes Daily analysis of laboratory values and/or  radiology reports with any subsequent need for medication adjustment of medical intervention for : Wound care problems;Diabetes problems   I attest that I was present, lead the team conference, and concur with the assessment and plan of the team.   HOYLE, LUCY 01/12/2019, 11:05 AM   Team conference was held via web/ teleconference due to [place] - 19

## 2019-01-12 NOTE — Progress Notes (Signed)
  [name], female   DOB: October 14, 1965, 53 y.o.   MRN: [id number]      Diagnosis codes:  Z89.511;  I50.32; E11.51  Height:    5'3"            Weight:   150 lbs         Patient suffers from right BKA, CHF and vascular disease   which impairs their ability to perform daily activities like toileting, dressing and mobility in the home.  A rolling walker will not resolve issue with performing activities of daily living.  A wheelchair will allow patient to safely perform daily activities.  Patient is not able to propel themselves in the home using a standard weight wheelchair due to general weakness .  Patient can self propel in the lightweight wheelchair.   Ashby Dawes, PA-C

## 2019-01-13 ENCOUNTER — Inpatient Hospital Stay (HOSPITAL_COMMUNITY): Payer: BLUE CROSS/BLUE SHIELD

## 2019-01-13 LAB — GLUCOSE, CAPILLARY
Glucose-Capillary: 118 mg/dL — ABNORMAL HIGH (ref 70–99)
Glucose-Capillary: 64 mg/dL — ABNORMAL LOW (ref 70–99)
Glucose-Capillary: 74 mg/dL (ref 70–99)
Glucose-Capillary: 91 mg/dL (ref 70–99)
Glucose-Capillary: 112 mg/dL — ABNORMAL HIGH (ref 70–99)

## 2019-01-13 MED ORDER — ONETOUCH ULTRA BLUE VI STRP: ORAL_STRIP | 2 refills | Status: DC

## 2019-01-13 MED ORDER — AMLODIPINE BESYLATE 5 MG PO TABS: 5.0000 mg | ORAL_TABLET | Freq: Every day | ORAL | 1 refills | Status: DC

## 2019-01-13 MED ORDER — ATORVASTATIN CALCIUM 80 MG PO TABS: 80.0000 mg | ORAL_TABLET | Freq: Every day | ORAL | 1 refills | Status: DC

## 2019-01-13 MED ORDER — ADULT MULTIVITAMIN W/MINERALS CH: 1.0000 | ORAL_TABLET | Freq: Every day | ORAL | Status: DC

## 2019-01-13 MED ORDER — LEVOTHYROXINE SODIUM 25 MCG PO TABS: ORAL_TABLET | ORAL | 1 refills | Status: AC

## 2019-01-13 MED ORDER — ESCITALOPRAM OXALATE 10 MG PO TABS: 10.0000 mg | ORAL_TABLET | Freq: Every day | ORAL | 0 refills | Status: DC

## 2019-01-13 MED ORDER — ISOSORBIDE MONONITRATE ER 30 MG PO TB24: 15.0000 mg | ORAL_TABLET | Freq: Every day | ORAL | 0 refills | Status: DC

## 2019-01-13 MED ORDER — METOCLOPRAMIDE HCL 5 MG PO TABS: 5.0000 mg | ORAL_TABLET | Freq: Three times a day (TID) | ORAL | 0 refills | Status: DC

## 2019-01-13 MED ORDER — PANTOPRAZOLE SODIUM 40 MG PO TBEC: 40.0000 mg | DELAYED_RELEASE_TABLET | Freq: Every day | ORAL | 0 refills | Status: DC

## 2019-01-13 MED ORDER — AMITRIPTYLINE HCL 10 MG PO TABS: 5.0000 mg | ORAL_TABLET | Freq: Every day | ORAL | 0 refills | Status: DC

## 2019-01-13 MED ORDER — METOPROLOL TARTRATE 25 MG PO TABS: 25.0000 mg | ORAL_TABLET | Freq: Two times a day (BID) | ORAL | 1 refills | Status: DC

## 2019-01-13 MED ORDER — ONETOUCH ULTRA 2 W/DEVICE KIT: PACK | 0 refills | Status: DC

## 2019-01-13 MED ORDER — DOCUSATE SODIUM 100 MG PO CAPS: 100.0000 mg | ORAL_CAPSULE | Freq: Two times a day (BID) | ORAL | 0 refills | Status: DC

## 2019-01-13 MED ORDER — PEN NEEDLES 31G X 5 MM MISC: 1.0000 | Freq: Every day | 1 refills | Status: DC

## 2019-01-13 NOTE — Progress Notes (Signed)
[facility] PHYSICAL MEDICINE & REHABILITATION PROGRESS NOTE   Subjective/Complaints: Pt had a good night. Able to sleep. Arose easily this morning  ROS: Patient denies fever, rash, sore throat, blurred vision, nausea, vomiting, diarrhea, cough, shortness of breath or chest pain,  headache, or mood change.   Objective:   No results found. Recent Labs    01/10/19 1117  WBC 12.1*  HGB 9.4*  HCT 29.7*  PLT 699*   No results for input(s): NA, K, CL, CO2, GLUCOSE, BUN, CREATININE, CALCIUM in the last 72 hours. No intake or output data in the 24 hours ending 01/13/19 0940   Physical Exam: Vital Signs Blood pressure (!) 148/86, pulse 73, temperature 98.7 F (37.1 C), temperature source Oral, resp. rate 15, height 5\' 3"  (1.6 m), weight 67.6 kg, last menstrual period 02/12/2011, SpO2 100 %. Constitutional: No distress . Vital signs reviewed. HEENT: EOMI, oral membranes moist Neck: supple Cardiovascular: RRR without murmur. No JVD    Respiratory: CTA Bilaterally without wheezes or rales. Normal effort    GI: BS +, non-tender, non-distended  Musc: Right stump with tenderness Neurological: She is alert.  She follows full commands.  Motor: Bilateral upper extremities: 4+/5 proximal distal Left lower extremity: 4+/5 proximal to distal, stable Right lower extremity: Hip flexion 4/5 stable Skin: Amputation well approximated. Still some fragile/ischemic areas along incision, minmal s/s drainage Psychiatric: pleasant, able to look at leg today  Assessment/Plan: 1. Functional deficits secondary to Right BKA which require 3+ hours per day of interdisciplinary therapy in a comprehensive inpatient rehab setting.  Physiatrist is providing close team supervision and 24 hour management of active medical problems listed below.  Physiatrist and rehab team continue to assess barriers to discharge/monitor patient progress toward functional and medical goals  Care Tool:  Bathing    Body parts  bathed by patient: Right arm, Left arm, Chest, Abdomen, Front perineal area, Buttocks, Right upper leg, Left upper leg, Face   Body parts bathed by helper: Left lower leg Body parts n/a: Right lower leg   Bathing assist Assist Level: Minimal Assistance - Patient > 75%     Upper Body Dressing/Undressing Upper body dressing   What is the patient wearing?: Pull over shirt    Upper body assist Assist Level: Supervision/Verbal cueing    Lower Body Dressing/Undressing Lower body dressing      What is the patient wearing?: Ace wrap/stump shrinker     Lower body assist Assist for lower body dressing: Supervision/Verbal cueing     Toileting Toileting    Toileting assist Assist for toileting: Contact Guard/Touching assist     Transfers Chair/bed transfer  Transfers assist     Chair/bed transfer assist level: Supervision/Verbal cueing     Locomotion Ambulation   Ambulation assist   Ambulation activity did not occur: Safety/medical concerns(blister on Lt heel, Rt BKA)          Walk 10 feet activity   Assist           Walk 50 feet activity   Assist           Walk 150 feet activity   Assist           Walk 10 feet on uneven surface  activity   Assist           Wheelchair     Assist Will patient use wheelchair at discharge?: Yes Type of Wheelchair: Manual    Wheelchair assist level: Supervision/Verbal cueing Max wheelchair distance: 150 ft  Wheelchair 50 feet with 2 turns activity    Assist        Assist Level: Supervision/Verbal cueing   Wheelchair 150 feet activity     Assist Wheelchair 150 feet activity did not occur: Safety/medical concerns   Assist Level: Supervision/Verbal cueing    Medical Problem List and Plan: 1.  Decreased functional mobility secondary to right BKA on 12/31/2018. Wound VAC as directed.  -Continue CIR therapies including PT, OT   -WOUND/INCISION IS TOO FRAGILE TO APPLY SHRINKER.  CONTINUE ACE WRAP FOR SHORT TERM. REVIEWED APPLICATION WITH PATIENT TODAY. FAMILY WILL NEED EDUCATION ALSO 2.  Antithrombotics: -DVT/anticoagulation:  SCD left lower extremity             -antiplatelet therapy: aspirin 81 mg daily 3. Pain Management:   -Neurontin 300 mg 3 times a day  -Robaxin as needed.patient  -no narcotics at this time due to increased sensitivity to pain medication     4. Mood: Lexapro 10 mg daily             -antipsychotic agents: N/A 5. Neuropsych: This patient is not yet fully capable of making decisions on her own behalf. 6. Skin/Wound Care:  Routine skin checks.    VAC DC'd on 5/3---CONTINUE ACE  -  PRAFO boot for further protection of left heel, transfers  -foam dressing for heel sore as well 7. Fluids/Electrolytes/Nutrition:  Routine ins and outs 8. Acute on chronic anemia.   Hemoglobin up to 9.4 on 5/4 9. Diabetes mellitus peripheral neuropathy. Hemoglobin A1c 10.5.  -NovoLog 4 units 3 times a day  Lantus insulin 40 units daily at bedtime.,  Decreased to 35, decreased to 20 on 5/3---decreased to 15u QHS  -added AM lantus 5u  beginning 5/6  -Follow for pattern today. Was hypoglycemic at lunch yesterday but that was after a higher reading in morning 10. CAD with CABG. Continue aspirin. No chest pain or shortness of breath 11. Chronic diastolic congestive heart failure.  Monitor for signs and symptoms of fluid overload.  Filed Weights   01/11/19 0633 01/12/19 0500 01/13/19 0407  Weight: 65 kg 67.4 kg 67.6 kg   Stable on 5/7 12. Hypertension. Imdur 15 mg daily, Norvasc 5 mg daily.  Monitor with increased mobility.  Fairly Controlled 5/7 13. Hyperlipidemia. Lipitor 14. Hypothyroidism. Synthroid 15.Chronic kidney disease. Creatinine baseline 1.32-2.04.  Creatinine 1.29 on 4/30  Continue to monitor 16. Constipation. Laxative assistance 17. Nausea:?  Improving  Continue meds  LOS: 8 days A FACE TO FACE EVALUATION WAS PERFORMED  Meredith Staggers 01/13/2019, 9:40 AM

## 2019-01-13 NOTE — Progress Notes (Signed)
Occupational Therapy Session Note  Patient Details  Name: [name] MRN: [id number] Date of Birth: February 18, 1966  Today's Date: 01/13/2019 OT Individual Time: 8727-6184 OT Individual Time Calculation (min): 55 min    Short Term Goals: Week 1:  OT Short Term Goal 1 (Week 1): Pt will transfer to [facility] wiht LRAD and MIN A OT Short Term Goal 2 (Week 1): Pt will thread BLE into pants with AE PRN OT Short Term Goal 3 (Week 1): Pt will complete lateral leans wiht S on commode for 1/3 steps of toileting OT Short Term Goal 4 (Week 1): Pt will advance pants past hips wiht S   Skilled Therapeutic Interventions/Progress Updates:    1:1. Pt received in w/c stating pain was "okay" but not rating. Pt compeltes w/c mobility to/from all tx destinations with supervision and rest breaks PRN. Pt completes 8 min (4 min forward/backward) of UE ergometer with supervision on work load 4 to improve BUE endruance/strengthening required for ADLs and community mobility. Pt engaged in discussion while on ergometer about shrinker, socks, ace wrapping and prosthetic fit with pictures provided. Pt completes 4x5 w/c push ups with VC for technique for tricep strengthening required for BADLs/transfers. Pt lies prone ~15 min total to stretch out hip flexors while completing exercises for glute activation:hip extension, ab/adduct, and hamstring curl. Exited session with pt seated in w/c, call light in reach, and all needs met.  Therapy Documentation Precautions:  Precautions Precautions: Fall Precaution Comments: R BKA wound vac Required Braces or Orthoses: Other Brace Other Brace: Limb protector Restrictions Weight Bearing Restrictions: Yes RLE Weight Bearing: Non weight bearing General:   Vital Signs: Therapy Vitals Temp: 98.5 F (36.9 C) Temp Source: Oral Pulse Rate: (!) 58 Resp: 17 BP: 124/74 Patient Position (if appropriate): Sitting Oxygen Therapy SpO2: 100 % O2 Device: Room Air Pain:    ADL: ADL Upper Body Bathing: Supervision/safety Where Assessed-Upper Body Bathing: Edge of bed Lower Body Bathing: Supervision/safety Where Assessed-Lower Body Bathing: Edge of bed Upper Body Dressing: Supervision/safety Where Assessed-Upper Body Dressing: Edge of bed Toilet Transfer: Moderate assistance Toilet Transfer Method: Engineer, water: Art gallery manager    Praxis   Exercises:   Other Treatments:     Therapy/Group: Individual Therapy  Tonny Branch 01/13/2019, 3:12 PM

## 2019-01-13 NOTE — Progress Notes (Signed)
Social Work [name], female   DOB: [birth year], 53 y.o.   MRN: [id number]   Have reviewed team conference with pt and son, [name], and they are aware and agreeable with d/c on Sat 5/9 with supervision wheelchair level goals overall.  Unfortunately, family not available for family ed until day of discharge due to their work schedules.  Have planned for 11:00 am education on Saturday and tx aware.  HOYLE, LUCY, LCSW

## 2019-01-13 NOTE — Progress Notes (Signed)
Physical Therapy Session Note  Patient Details  Name: [name] MRN: [id number] Date of Birth: 03/16/1966  Today's Date: 01/12/2019 PT Individual Time:1310-1340   40 min   Short Term Goals: Week 1:  PT Short Term Goal 1 (Week 1): pt will perform functional transfers with min A PT Short Term Goal 2 (Week 1): pt will perform w/c parts management with supervision  Skilled Therapeutic Interventions/Progress Updates:   Pt received sitting in WC and agreeable to PT. PT instructed pt in WC mobility through rehab unit 2 x 200ft with supervision assist for safety and min cues for doorway management as well as up/down handicap ramp to prepare for home d/c. Cues for safety with initial ascent and exiting ramp to prevent posterior tipping. PT instructed pt in BUE therex to perform chest press with ball tap 2 x  1minute with 2# and 4 # bar weight. Overhead press, 2 x 15 with 4# bar weight. Patient returned to room and left sitting in WC with call bell in reach and all needs met.        Therapy Documentation Precautions:  Precautions Precautions: Fall Precaution Comments: R BKA wound vac Required Braces or Orthoses: Other Brace Other Brace: Limb protector Restrictions Weight Bearing Restrictions: (P) Yes RLE Weight Bearing: Non weight bearing Vital Signs: Therapy Vitals Temp: 98.7 F (37.1 C) Temp Source: Oral Pulse Rate: 73 Resp: 15 BP: (!) 148/86 Patient Position (if appropriate): Lying Oxygen Therapy SpO2: 100 % O2 Device: Room Air Pain:   denies at rest   Therapy/Group: Individual Therapy  Austin E Tucker 01/13/2019, 5:51 AM  

## 2019-01-13 NOTE — Progress Notes (Signed)
Occupational Therapy Session Note  Patient Details  Name: [name] MRN: [id number] Date of Birth: [birth year]  Today's Date: 01/13/2019 OT Individual Time: 5681-2751 OT Individual Time Calculation (min): 72 min    Short Term Goals: Week 1:  OT Short Term Goal 1 (Week 1): Pt will transfer to [facility] wiht LRAD and MIN A OT Short Term Goal 2 (Week 1): Pt will thread BLE into pants with AE PRN OT Short Term Goal 3 (Week 1): Pt will complete lateral leans wiht S on commode for 1/3 steps of toileting OT Short Term Goal 4 (Week 1): Pt will advance pants past hips wiht S   Skilled Therapeutic Interventions/Progress Updates:    1;1. Pt received in EOB with RN delivering pain medicaiton. Pt reporting need to toilet. Pt completes squat poivot transfer EOB<>w/c<>DAC over toilet with S and min question cues for set up/managing w/d parts. Pt voids B/B on toilet with supervision for clothing management in standing with grab bar. Pt completes bathing/dressing in w/c with supervision using lateral leans with VC for problem solving leaning in w/c and donning prafo boot. Pt easily frustrated throughout session when things do not work on first attempt. Pt completessquat pivot to TTB in simulated walk in shower with VC for set up of w/c. Exited session with pt seated in w/c, belt alarm on and call light in reach  Therapy Documentation Precautions:  Precautions Precautions: Fall Precaution Comments: R BKA wound vac Required Braces or Orthoses: Other Brace Other Brace: Limb protector Restrictions Weight Bearing Restrictions: (P) Yes RLE Weight Bearing: Non weight bearing General:   Vital Signs:  Pain:   ADL: ADL Upper Body Bathing: Supervision/safety Where Assessed-Upper Body Bathing: Edge of bed Lower Body Bathing: Supervision/safety Where Assessed-Lower Body Bathing: Edge of bed Upper Body Dressing: Supervision/safety Where Assessed-Upper Body Dressing: Edge of bed Toilet Transfer: Moderate  assistance Toilet Transfer Method: Engineer, water: Art gallery manager    Praxis   Exercises:   Other Treatments:     Therapy/Group: Individual Therapy  Tonny Branch 01/13/2019, 8:17 AM

## 2019-01-13 NOTE — Progress Notes (Signed)
Physical Therapy Session Note  Patient Details  Name: [name] MRN: [id number] Date of Birth: [birth year]  Today's Date: 01/13/2019 PT Individual Time: 0165-5374 PT Individual Time Calculation (min): 53 min   Short Term Goals: Week 1:  PT Short Term Goal 1 (Week 1): pt will perform functional transfers with min A PT Short Term Goal 2 (Week 1): pt will perform w/c parts management with supervision  Skilled Therapeutic Interventions/Progress Updates:    Patient in w/c in room and noted R residual limb legrest hitting end of pt's residual limb.  Attempted adjustment, but unable to position appropriately.  During session obtained another legrest able to support leg without bending and hitting end of residual limb.  Patient propelled w/c to therapy gym 150' with S increased time 1 rest break.  Transfer with PRAFO on L LE to mat with S cues for safe w/c set up.  Patient performed therex as noted below performing bed mobility with S including rolling to prone.  Pt. Transferred to w/c with S and performed armchair push ups.  Patient propelled to room and left with call bell in reach and chair alarm activated.   Therapy Documentation Precautions:  Precautions Precautions: Fall Precaution Comments: R BKA wound vac Required Braces or Orthoses: Other Brace Other Brace: Limb protector Restrictions Weight Bearing Restrictions: (P) Yes RLE Weight Bearing: Non weight bearing Pain: Pain Assessment Pain Score: 0-No pain  Exercises: Amputee Exercises Quad Sets: Strengthening;Right;Supine;10 reps Gluteal Sets: Strengthening;Both;10 reps;Other (comment)(prone) Towel Squeeze: Strengthening;Both;10 reps;Supine Hip Extension: Strengthening;Right;10 reps;Prone Hip ABduction/ADduction: Strengthening;Right;10 reps;Sidelying Knee Flexion: Strengthening;Right;10 reps(prone) Knee Extension: Strengthening;Right;10 reps;Seated Straight Leg Raises: Strengthening;Right;10 reps;Supine    Therapy/Group:  Individual Therapy  Reginia Naas  [place], PT 01/13/2019, 11:04 AM

## 2019-01-13 NOTE — Discharge Summary (Signed)
Physician Discharge Summary  [name] MRN: [id number] DOB/AGE: 55/14/1967 55 y.o.  Admit date: 01/05/2019 Discharge date: 01/15/2019  Discharge Diagnoses:  Active Problems:   Right below-knee amputee ([place])   Labile blood pressure   Labile blood glucose   Phantom limb pain (H[facility])   Hypoglycemia   Diabetes mellitus type 2 in nonobese [facility]) CAD with CABG Chronic diastolic congestive heart failure Acute on chronic anemia Chronic kidney disease Constipation  Discharged Condition: Stable  Significant Diagnostic Studies: US Renal  Result Date: 12/15/2018 CLINICAL DATA:  Acute kidney injury EXAM: RENAL / URINARY TRACT ULTRASOUND COMPLETE COMPARISON:  None. FINDINGS: Right Kidney: Renal measurements: 12.3 x 5.2 x 6.1 cm = volume: 205 mL . Echogenicity within normal limits. No mass or hydronephrosis visualized. Left Kidney: Renal measurements: 12.1 x 5.7 x 5.3 cm = volume: 190 mL. Echogenicity within normal limits. No mass or hydronephrosis visualized. Bladder: Appears normal for degree of bladder distention. IMPRESSION: No acute findings.  No hydronephrosis. Electronically Signed   By: Rolm Baptise M.D.   On: 12/15/2018 12:35    Labs:  Basic Metabolic Panel: No results for input(s): NA, K, CL, CO2, GLUCOSE, BUN, CREATININE, CALCIUM, MG, PHOS in the last 168 hours.  CBC: Recent Labs  Lab 01/10/19 1117  WBC 12.1*  NEUTROABS 8.7*  HGB 9.4*  HCT 29.7*  MCV 82.7  PLT 699*    CBG: Recent Labs  Lab 01/13/19 0622 01/13/19 1145 01/13/19 1217 01/13/19 1659 01/13/19 2126  GLUCAP 118* 64* 74 91 112*  Family history.  Mother with arthritis Brother with history of CVA as well as leukemia, paternal grandmother with diabetes.  Denies any cancer  Brief HPI:    [name] is a 53 year old right-handed female with history of chronic kidney disease stage III, acute on chronic diastolic congestive heart failure, CAD with CABG October 2019, hypertension, intracerebral  hemorrhage February 2017, hyperlipidemia, diabetes mellitus and peripheral vascular disease.  Per chart review lives with her children 1 level home ramped entrance independent with assistive device.  Presented 12/31/2018 with right foot gangrene recent right first ray amputation noted wound dehiscence of limb was not felt to be salvageable undergoing right BKA 12/31/2018 per Dr. [name] Given.  Hospital course complicated by pain and hypokalemia with supplement added.  On 01/01/2019 rapid response called due to decreased responsiveness and desaturations after receiving oxycodone.  She did receive Narcan with good results and all narcotics were discontinued.  Therapy evaluations completed and patient was admitted for comprehensive rehab program  Hospital Course: [name] was admitted to rehab 01/05/2019 for inpatient therapies to consist of PT, ST and OT at least three hours five days a week. Past admission physiatrist, therapy team and rehab RN have worked together to provide customized collaborative inpatient rehab.  Pertaining to patient right BKA wound VAC in place at the discretion of orthopedic services x7 days she will follow-up with Dr. [name] Given.  Remained on aspirin for history of CAD with CABG.  Pain management and use of scheduled Neurontin and Robaxin as needed.  No narcotics due to increased sensitivity to pain medication.  Mood stabilization with Lexapro and emotional support provided.  Acute on chronic anemia 9.4 and stable.  She did have a hemoglobin A1c of 10.5 insulin therapy as directed with follow diabetic teaching she would follow-up with her primary MD.  She exhibited no other signs of fluid overload and would follow-up with her primary MD again.  Blood pressures controlled on Imdur and Norvasc  no orthostasis.  Hormone supplement for hypothyroidism.  Lipitor for hyperlipidemia.  Chronic kidney disease baseline creatinine 1.32-2.04 with latest creatinine 1.29.  Physical exam.  Blood pressure 128/76  pulse 64 temperature 98 respirations 16 oxygen saturation 96% room air Constitutional.  Well-developed well-nourished Head.  Normocephalic and atraumatic Eyes.  EOMs normal no discharge without nystagmus Respiratory effort normal no respiratory distress GI.  Exhibits no distention nontender good bowel sounds.  No palpable masses Cardiovascular.  Regular rate rhythm without murmur heard without friction rub Neurological.  Alert follows commands bilateral upper extremities 4+ out of 5 left Lower extremity 4+ out of 5 right lower extremity hip flexion 4 out of 5 some pain inhibition Skin.  Amputation site dressed with wound VAC in place  Rehab course: During patient's stay in rehab weekly team conferences were held to monitor patient's progress, set goals and discuss barriers to discharge. At admission, patient required moderate assist supine to sit minimal assist sit to supine, moderate assist sit to stand, moderate assist squat pivot transfers.  Supervision upper body bathing moderate assist lower body bathing minimal assist upper body dressing Max assist lower body dressing  She  has had improvement in activity tolerance, balance, postural control as well as ability to compensate for deficits. [name] has had improvement in functional use RUE/LUE  and RLE/LLE as well as improvement in awareness.  Working with energy conservation techniques.  Supine to sit and squat pivot transfers close supervision.  Self propelled her wheelchair with supervision.  Independent with wheelchair parts and management.  Patient completes wheelchair mobility to and from all sessions with supervision.  Gather belongings for ADLs needing some assist for lower body dressing.  Family teaching completed plan discharged home       Disposition: Discharge disposition: 01-Home or Self Care     Discharged home   Diet: Diabetic diet  Special Instructions: No driving smoking or alcohol  Medications at discharge 1.   Tylenol as needed 2.  Elavil 5 mg p.o. nightly 3.  Norvasc 5 mg p.o. daily 4.  Aspirin 81 mg p.o. daily 5.  Lipitor 80 mg p.o. daily 6.  Colace 100 mg p.o. twice daily 7.  Lexapro 10 mg p.o. daily 8.  Lantus insulin 15 units at bedtime 5 units in the a.m. 9.  Imdur 15 mg p.o. daily 10.  Synthroid 25 mcg p.o. daily 11.  Reglan 5 mg p.o. 3 times daily 12.  Lopressor 25 mg p.o. twice daily 13.  Multivitamin daily 14.  Protonix 40 mg p.o. daily      Follow-up Information    Meredith Staggers, MD Follow up.   Specialty:  Physical Medicine and Rehabilitation Why:  As directed Contact information: [address] [place] [address] [phone]        [name], MD Follow up.   Specialty:  Orthopedic Surgery Why:  Call for appointment 2 weeks Contact information: [place] [address] [phone]           Signed: Lavon Paganini [place] 01/14/2019, 5:30 AM

## 2019-01-13 NOTE — Plan of Care (Signed)
  Problem: RH BOWEL ELIMINATION Goal: RH STG MANAGE BOWEL WITH ASSISTANCE Description STG Manage Bowel with Assistance. Min  Outcome: Progressing Goal: RH STG MANAGE BOWEL W/MEDICATION W/ASSISTANCE Description STG Manage Bowel with Medication with Assistance. Min  Outcome: Progressing   Problem: RH BLADDER ELIMINATION Goal: RH STG MANAGE BLADDER WITH ASSISTANCE Description STG Manage Bladder With Assistance min  Outcome: Progressing   Problem: RH SKIN INTEGRITY Goal: RH STG SKIN FREE OF INFECTION/BREAKDOWN Description Free of skin breakdown and infection with min assist  Outcome: Progressing Goal: RH STG MAINTAIN SKIN INTEGRITY WITH ASSISTANCE Description STG Maintain Skin Integrity With Assistance. min  Outcome: Progressing   Problem: RH PAIN MANAGEMENT Goal: RH STG PAIN MANAGED AT OR BELOW PT'S PAIN GOAL Description Less than 3  Outcome: Progressing

## 2019-01-13 NOTE — Progress Notes (Signed)
Hypoglycemic Event  CBG: 64  Treatment: can of sprite given, lunch tray arrived. Pt eating lunch.  Symptoms: none  Follow-up CBG: Time:1217 CBG Result:74  Possible Reasons for Event: unknown  Comments/MD notified: no    Mekides W Nida

## 2019-01-14 ENCOUNTER — Inpatient Hospital Stay (HOSPITAL_COMMUNITY): Payer: BLUE CROSS/BLUE SHIELD | Admitting: Occupational Therapy

## 2019-01-14 ENCOUNTER — Inpatient Hospital Stay (HOSPITAL_COMMUNITY): Payer: BLUE CROSS/BLUE SHIELD

## 2019-01-14 ENCOUNTER — Inpatient Hospital Stay (HOSPITAL_COMMUNITY): Payer: BLUE CROSS/BLUE SHIELD | Admitting: Physical Therapy

## 2019-01-14 DIAGNOSIS — S88111A Complete traumatic amputation at level between knee and ankle, right lower leg, initial encounter: Secondary | ICD-10-CM | POA: Diagnosis not present

## 2019-01-14 DIAGNOSIS — I5032 Chronic diastolic (congestive) heart failure: Secondary | ICD-10-CM | POA: Diagnosis not present

## 2019-01-14 LAB — CBC
HCT: 27.8 % — ABNORMAL LOW (ref 36.0–46.0)
Hemoglobin: 8.8 g/dL — ABNORMAL LOW (ref 12.0–15.0)
MCH: 26.3 pg (ref 26.0–34.0)
MCHC: 31.7 g/dL (ref 30.0–36.0)
MCV: 83.2 fL (ref 80.0–100.0)
Platelets: 613 10*3/uL — ABNORMAL HIGH (ref 150–400)
RBC: 3.34 MIL/uL — ABNORMAL LOW (ref 3.87–5.11)
RDW: 13.6 % (ref 11.5–15.5)
WBC: 9.1 10*3/uL (ref 4.0–10.5)
nRBC: 0 % (ref 0.0–0.2)

## 2019-01-14 LAB — BASIC METABOLIC PANEL
Anion gap: 12 (ref 5–15)
BUN: 23 mg/dL — ABNORMAL HIGH (ref 6–20)
CO2: 27 mmol/L (ref 22–32)
Calcium: 8.9 mg/dL (ref 8.9–10.3)
Chloride: 99 mmol/L (ref 98–111)
Creatinine, Ser: 1.21 mg/dL — ABNORMAL HIGH (ref 0.44–1.00)
GFR calc Af Amer: 58 mL/min — ABNORMAL LOW (ref >60)
GFR calc non Af Amer: 50 mL/min — ABNORMAL LOW (ref >60)
Glucose, Bld: 98 mg/dL (ref 70–99)
Potassium: 3.9 mmol/L (ref 3.5–5.1)
Sodium: 138 mmol/L (ref 135–145)

## 2019-01-14 LAB — GLUCOSE, CAPILLARY
Glucose-Capillary: 88 mg/dL (ref 70–99)
Glucose-Capillary: 62 mg/dL — ABNORMAL LOW (ref 70–99)
Glucose-Capillary: 89 mg/dL (ref 70–99)
Glucose-Capillary: 159 mg/dL — ABNORMAL HIGH (ref 70–99)
Glucose-Capillary: 143 mg/dL — ABNORMAL HIGH (ref 70–99)

## 2019-01-14 MED ORDER — INSULIN GLARGINE 100 UNIT/ML SOLOSTAR PEN: 15.0000 [IU] | PEN_INJECTOR | Freq: Every day | SUBCUTANEOUS | 11 refills | Status: DC

## 2019-01-14 MED ORDER — INSULIN GLARGINE 100 UNIT/ML SOLOSTAR PEN: 5.0000 [IU] | PEN_INJECTOR | Freq: Every day | SUBCUTANEOUS | 11 refills | Status: DC

## 2019-01-14 MED ORDER — INSULIN GLARGINE 100 UNIT/ML ~~LOC~~ SOLN
10.0000 [IU] | Freq: Every day | SUBCUTANEOUS | Status: DC
  Administered 2019-01-14: 10 [IU] via SUBCUTANEOUS
  Filled 2019-01-14 (×2): qty 0.1

## 2019-01-14 NOTE — Progress Notes (Signed)
Physical Therapy Session Note  Patient Details  Name: [name] MRN: [id number] Date of Birth: 1965-09-21  Today's Date: 01/14/2019 PT Individual Time: 0915-1015 PT Individual Time Calculation (min): 60 min   Short Term Goals: Week 1:  PT Short Term Goal 1 (Week 1): pt will perform functional transfers with min A PT Short Term Goal 2 (Week 1): pt will perform w/c parts management with supervision  Skilled Therapeutic Interventions/Progress Updates:  Pt received in w/c & agreeable to tx. Pt propels around unit with BUE & mod I, taking rest breaks PRN. Pt is able to set up w/c for squat pivot transfer to car with cuing to decrease space between w/c & car as much as possible. Pt completes squat pivot transfer to car at sedan simulated height with supervision and manages w/c parts without assistance and less time given on this date. Provided pt with handout re residual limb wrapping for family to have it to refer back to. Back in room pt transfers bed<>w/c with supervision and squat pivot, completes bed mobility with mod I. Pt is able to transfer sit<>stand from w/c with RW & supervision and reach outside of BOS with BUE and close supervision for dynamic standing balance with pt reporting feeling "a little wobbly".  In dayroom, pt transfers w/c<>nu-step via squat pivot with supervision after therapist provides assistance with w/c set up for squat pivot transfer to nu-step seat, & utilizes nu-step on level 4 x 10 minutes with task focusing on global strengthening & endurance training. Pt denies any questions/concerns re d/c home tomorrow. Pt reports her son has measured doorways & anticipates w/c will fit through all doorways. At end of session pt left sitting in w/c in room with call bell in reach & PA in room.  Therapy Documentation Precautions:  Precautions Precautions: Fall Precaution Comments: fragile skin on R residual limb Required Braces or Orthoses: Other Brace Other Brace: Limb  protector Restrictions Weight Bearing Restrictions: Yes RLE Weight Bearing: Non weight bearing  Pain: Pt reports soreness in BUE & R residual limb but does not rate - pt denies requesting for pain meds.    Therapy/Group: Individual Therapy  Waunita Schooner 01/14/2019, 10:16 AM

## 2019-01-14 NOTE — Progress Notes (Signed)
Physical Therapy Session Note  Patient Details  Name: [name] MRN: [id number] Date of Birth: 1966/08/07  Today's Date: 01/14/2019 PT Individual Time: 1333-1430 PT Individual Time Calculation (min): 57 min   Short Term Goals: Week 2:  PT Short Term Goal 1 (Week 1): pt will perform functional transfers with min A PT Short Term Goal 2 (Week 1): pt will perform w/c parts management with supervision  Skilled Therapeutic Interventions/Progress Updates:    Patient in w/c in room.  Reports got her equipment for home.  Discussed ways to set up legrest for best position as noted w/c cushion with increased depth.  States heel a little sore and RN to check it after this session. Demonstrated armchair pushups x 10 with proper w/c set up.   Propelled in w/c to dayroom.  Transferred to Nu Step with S including set up.  Performed 8 minutes on Nu Step level 4 UE's and L LE.  Propelled in w/c to general gym and performed seated ball toss to rebounder x 20 x 3 sets 2 with 1/2 # weights on wrists.  Sitting UE therex with 6# hand weights overhead punches, bicep curls, shoulder flexion and abduction x 10 each.  Seated rows with blue theraband x 10 x 2 with towel roll at back and cues for technique.  Patient propelled back to room and left in w/c with call bell/needs in reach.  Therapy Documentation Precautions:  Precautions Precautions: Fall Precaution Comments: fragile skin on R residual limb Required Braces or Orthoses: Other Brace Other Brace: Limb protector Restrictions Weight Bearing Restrictions: Yes RLE Weight Bearing: Non weight bearing Pain: Pain Assessment Pain Scale: 0-10 Pain Score: 3  Pain Type: Acute pain Pain Location: Heel Pain Orientation: Left Pain Descriptors / Indicators: Sore Pain Onset: On-going Pain Intervention(s): Repositioned     Therapy/Group: Individual Therapy  Reginia Naas  Magda Kiel, PT 01/14/2019, 1:46 PM

## 2019-01-14 NOTE — Progress Notes (Signed)
Occupational Therapy Session Note  Patient Details  Name: EARA BURRUEL MRN: [id number] Date of Birth: 10/21/1965  Today's Date: 01/14/2019 OT Individual Time: 7416-3845 OT Individual Time Calculation (min): 24 min    Short Term Goals: Week 1:  OT Short Term Goal 1 (Week 1): Pt will transfer to [facility] wiht LRAD and MIN A OT Short Term Goal 2 (Week 1): Pt will thread BLE into pants with AE PRN OT Short Term Goal 3 (Week 1): Pt will complete lateral leans wiht S on commode for 1/3 steps of toileting OT Short Term Goal 4 (Week 1): Pt will advance pants past hips wiht S   Skilled Therapeutic Interventions/Progress Updates:    1;1. Pt received in w/c. Pt requesting to work on arm exercise. Pt completes w/c propulsion to/from all tx destinations with mod I. Pt completes 3x1.5 min beach ball volley with 4# dowel rod followed by 1 Kg medicine ball circuit for global UE strengthening required for ADLs and transfers. Pt requires demo cuing for exercises in circuit. Exited session with pt seated in w/c, call light in reach and all needs met.   Therapy Documentation Precautions:  Precautions Precautions: Fall Precaution Comments: fragile skin on R residual limb Required Braces or Orthoses: Other Brace Other Brace: Limb protector Restrictions Weight Bearing Restrictions: Yes RLE Weight Bearing: Non weight bearing General:   Vital Signs:  Pain:   ADL: ADL Upper Body Bathing: Supervision/safety Where Assessed-Upper Body Bathing: Edge of bed Lower Body Bathing: Supervision/safety Where Assessed-Lower Body Bathing: Edge of bed Upper Body Dressing: Supervision/safety Where Assessed-Upper Body Dressing: Edge of bed Toilet Transfer: Moderate assistance Toilet Transfer Method: Engineer, water: Art gallery manager    Praxis   Exercises:   Other Treatments:     Therapy/Group: Individual Therapy  Tonny Branch 01/14/2019, 12:03 PM

## 2019-01-14 NOTE — Progress Notes (Signed)
Physical Therapy Discharge Summary  Patient Details  Name: [name] MRN: [id number] Date of Birth: 1966/03/10  Today's Date: 01/15/2019   Patient has met 8 of 8 long term goals due to improved activity tolerance, improved balance, improved postural control, increased strength, decreased pain, ability to compensate for deficits, improved attention, improved awareness and improved coordination.  Patient to discharge at a wheelchair level supervision for squat pivot transfers.   Patient's care partner is independent to provide the necessary cognitive assistance at discharge. Pt's family participated in family education session and are able to provide supervision assist upon d/c.   Reasons goals not met: n/a  Recommendation:  Patient will benefit from ongoing skilled PT services in home health setting to continue to advance safe functional mobility, address ongoing impairments in standing tolerance, gait & stairs as able, safety awareness, strengthening, endurance, education re amputations, balance and minimize fall risk.  Equipment: 18x18 w/c with R amputee support pad, RW  Reasons for discharge: treatment goals met and discharge from hospital  Patient/family agrees with progress made and goals achieved: Yes  PT Discharge Precautions/Restrictions Precautions Precautions: Fall Precaution Comments: fragile skin on R residual limb Required Braces or Orthoses: Other Brace Other Brace: Limb protector Restrictions Weight Bearing Restrictions: Yes RLE Weight Bearing: Non weight bearing  Vision/Perception  Pt wears glasses for reading only at baseline. Pt reports impaired vision L eye at baseline. Perception appears WNL.  Cognition Overall Cognitive Status: Within Functional Limits for tasks assessed Arousal/Alertness: Awake/alert Memory: Impaired Memory Impairment: Decreased recall of new information Awareness: Appears intact Problem Solving: Appears intact Safety/Judgment:  Impaired   Sensation Sensation Light Touch: (pt denies numbness/tingling in extremities) Coordination Gross Motor Movements are Fluid and Coordinated: Yes Fine Motor Movements are Fluid and Coordinated: Yes  Motor  Motor Motor: Within Functional Limits Motor - Discharge Observations: generalized deconditioning/weakness   Mobility Bed Mobility Bed Mobility: Rolling Right;Rolling Left;Sit to Supine;Supine to Sit Rolling Right: Independent with assistive device Rolling Left: Independent with assistive device Supine to Sit: Independent with assistive device Sit to Supine: Independent with assistive device Transfers Transfers: Sit to Stand;Squat Pivot Transfers Sit to Stand: Supervision/Verbal cueing Squat Pivot Transfers: Supervision/Verbal cueing Transfer (Assistive device): Rollator(for sit<>stand)  Locomotion  Gait Ambulation: No Gait Gait: No Stairs / Additional Locomotion Stairs: No Wheelchair Mobility Wheelchair Mobility: Yes Wheelchair Assistance: Independent with Camera operator: Both upper extremities Wheelchair Parts Management: Supervision/cueing Distance: 150   Trunk/Postural Assessment  Cervical Assessment Cervical Assessment: Exceptions to WFL(forward head) Thoracic Assessment Thoracic Assessment: Exceptions to WFL(rounded shoulders) Lumbar Assessment Lumbar Assessment: Exceptions to WFL(posterior pelvic tilt)   Balance Balance Balance Assessed: Yes Dynamic Sitting Balance Dynamic Sitting - Balance Support: (LLE supported) Dynamic Sitting - Level of Assistance: 5: Stand by assistance Static Standing Balance Static Standing - Balance Support: Bilateral upper extremity supported;During functional activity Static Standing - Level of Assistance: 5: Stand by assistance Dynamic Standing Balance Dynamic Standing - Balance Support: During functional activity;Bilateral upper extremity supported Dynamic Standing - Level of Assistance:  5: Stand by assistance  Extremity Assessment  RUE Assessment RUE Assessment: Within Functional Limits LUE Assessment LUE Assessment: Exceptions to [facility] General Strength Comments: pt with limted ROM & pain when performing overhead ROM 2/2 bone spur (per pt report)  RLE Assessment Active Range of Motion (AROM) Comments: WNL General Strength Comments: strength not tested, pt able to achieve full knee extension AROM LLE Assessment LLE Assessment: Within Functional Limits General Strength Comments: grossly 3+/5 (no buckling noted in  weight bearing) but strength not formally tested  [name], PT, DPT 01/15/19  11:22 AM   [name], PT, DPT 01/14/2019, 9:50 AM  [name] 01/14/2019, 9:50 AM

## 2019-01-14 NOTE — Progress Notes (Signed)
Social Work  Discharge Note  The overall goal for the admission was met for:   Discharge location: Yes - pt returning home with son and dtr-in-law  Length of Stay: Yes - 10 days (with discharge on 01/15/19)  Discharge activity level: Yes - supervision w/c level  Home/community participation: Yes  Services provided included: MD, RD, PT, OT, RN, Pharmacy and SW  Financial Services: Private Insurance: BCBS  Follow-up services arranged: Home Health: RN, PT, OT via [facility], DME: 18x18 lightweight w/c with right amputee support pad, cushion, 3n1 commode and tub bench via [employer] and Patient/Family has no preference for HH/DME agencies  Comments (or additional information):        Contact info:  Pt @ [phone]                    Son, Corey Bolen @ [phone]  Patient/Family verbalized understanding of follow-up arrangements: Yes  Individual responsible for coordination of the follow-up plan: pt  Confirmed correct DME delivered: HOYLE, LUCY 01/14/2019    HOYLE, LUCY 

## 2019-01-14 NOTE — Progress Notes (Signed)
Occupational Therapy Session Note  Patient Details  Name: [name] MRN: [id number] Date of Birth: September 14, 1965  Today's Date: 01/14/2019 OT Individual Time: 4098-1191 OT Individual Time Calculation (min): 26 min    Short Term Goals: Week 1:  OT Short Term Goal 1 (Week 1): Pt will transfer to [facility] wiht LRAD and MIN A OT Short Term Goal 2 (Week 1): Pt will thread BLE into pants with AE PRN OT Short Term Goal 3 (Week 1): Pt will complete lateral leans wiht S on commode for 1/3 steps of toileting OT Short Term Goal 4 (Week 1): Pt will advance pants past hips wiht S   Skilled Therapeutic Interventions/Progress Updates:    1:1. Pt reiceved in w/c requesting to work on arm strength. Pt propels w/c with MOD I to/from all tx destinations. Pt completes 3x1.5 min shuttle game focusing on shoulder strengthening/endurance with 1# wrist weights applied while seated in w/c. Pt completes kitchen mobility to maneuver at seated level in w/c to make coffee with k-pod with supervision. Instructed pt in hemi technqiue to propel w/c to carry cup of coffee back to room. Exited session with pt seated in w.c, call light in reach and all needs met  Therapy Documentation Precautions:  Precautions Precautions: Fall Precaution Comments: fragile skin on R residual limb Required Braces or Orthoses: Other Brace Other Brace: Limb protector Restrictions Weight Bearing Restrictions: Yes RLE Weight Bearing: Non weight bearing General:   Vital Signs: Therapy Vitals Temp: 98.8 F (37.1 C) Temp Source: Oral Pulse Rate: 60 Resp: 13 BP: 116/64 Patient Position (if appropriate): Sitting Oxygen Therapy SpO2: 100 % O2 Device: Room Air Pain: Pain Assessment Pain Score: 3  Pain Type: Acute pain Pain Location: Heel Pain Orientation: Left Pain Descriptors / Indicators: Sore Pain Onset: On-going Pain Intervention(s): Repositioned ADL: ADL Upper Body Bathing: Supervision/safety Where Assessed-Upper Body  Bathing: Edge of bed Lower Body Bathing: Supervision/safety Where Assessed-Lower Body Bathing: Edge of bed Upper Body Dressing: Supervision/safety Where Assessed-Upper Body Dressing: Edge of bed Toilet Transfer: Moderate assistance Toilet Transfer Method: Engineer, water: Art gallery manager    Praxis   Exercises:   Other Treatments:     Therapy/Group: Individual Therapy  Tonny Branch 01/14/2019, 4:11 PM

## 2019-01-14 NOTE — Progress Notes (Signed)
[facility] PHYSICAL MEDICINE & REHABILITATION PROGRESS NOTE   Subjective/Complaints: No new issues. Excited to be going home  ROS: Patient denies fever, rash, sore throat, blurred vision, nausea, vomiting, diarrhea, cough, shortness of breath or chest pain, joint or back pain, headache, or mood change.    Objective:   No results found. Recent Labs    01/14/19 0452  WBC 9.1  HGB 8.8*  HCT 27.8*  PLT 613*   Recent Labs    01/14/19 0452  NA 138  K 3.9  CL 99  CO2 27  GLUCOSE 98  BUN 23*  CREATININE 1.21*  CALCIUM 8.9    Intake/Output Summary (Last 24 hours) at 01/14/2019 0955 Last data filed at 01/14/2019 0747 Gross per 24 hour  Intake 950 ml  Output -  Net 950 ml     Physical Exam: Vital Signs Blood pressure (!) 148/80, pulse 67, temperature 98.4 F (36.9 C), temperature source Oral, resp. rate 12, height 5\' 3"  (1.6 m), weight 66.7 kg, last menstrual period 02/12/2011, SpO2 100 %. Constitutional: No distress . Vital signs reviewed. HEENT: EOMI, oral membranes moist Neck: supple Cardiovascular: RRR without murmur. No JVD    Respiratory: CTA Bilaterally without wheezes or rales. Normal effort    GI: BS +, non-tender, non-distended  Musc: Right stump with tenderness Neurological: She is alert.   Motor: Bilateral upper extremities: 4+/5 proximal distal Left lower extremity: 4+/5 proximal to distal, stable Right lower extremity: Hip flexion 4/5 stable Skin: incision CDI, fragile areas along incision, minimal drainage Psychiatric: pleasant, able to look at leg today  Assessment/Plan: 1. Functional deficits secondary to Right BKA which require 3+ hours per day of interdisciplinary therapy in a comprehensive inpatient rehab setting.  Physiatrist is providing close team supervision and 24 hour management of active medical problems listed below.  Physiatrist and rehab team continue to assess barriers to discharge/monitor patient progress toward functional and medical  goals  Care Tool:  Bathing    Body parts bathed by patient: Right arm, Left arm, Chest, Abdomen, Front perineal area, Buttocks, Right upper leg, Left upper leg, Face   Body parts bathed by helper: Left lower leg Body parts n/a: Right lower leg   Bathing assist Assist Level: Minimal Assistance - Patient > 75%     Upper Body Dressing/Undressing Upper body dressing   What is the patient wearing?: Pull over shirt    Upper body assist Assist Level: Supervision/Verbal cueing    Lower Body Dressing/Undressing Lower body dressing      What is the patient wearing?: Ace wrap/stump shrinker     Lower body assist Assist for lower body dressing: Supervision/Verbal cueing     Toileting Toileting    Toileting assist Assist for toileting: Contact Guard/Touching assist     Transfers Chair/bed transfer  Transfers assist     Chair/bed transfer assist level: Supervision/Verbal cueing     Locomotion Ambulation   Ambulation assist   Ambulation activity did not occur: Safety/medical concerns          Walk 10 feet activity   Assist  Walk 10 feet activity did not occur: Safety/medical concerns        Walk 50 feet activity   Assist Walk 50 feet with 2 turns activity did not occur: Safety/medical concerns         Walk 150 feet activity   Assist Walk 150 feet activity did not occur: Safety/medical concerns         Walk 10 feet on uneven  surface  activity   Assist Walk 10 feet on uneven surfaces activity did not occur: Safety/medical concerns         Wheelchair     Assist Will patient use wheelchair at discharge?: Yes Type of Wheelchair: Manual    Wheelchair assist level: Independent Max wheelchair distance: 150 ft    Wheelchair 50 feet with 2 turns activity    Assist        Assist Level: Independent   Wheelchair 150 feet activity     Assist Wheelchair 150 feet activity did not occur: Safety/medical concerns   Assist  Level: Independent    Medical Problem List and Plan: 1.  Decreased functional mobility secondary to right BKA on 12/31/2018. Wound VAC as directed.  -Continue CIR therapies including PT, OT   -WOUND/INCISION IS TOO FRAGILE TO APPLY SHRINKER. CONTINUE ACE WRAP FOR SHORT TERM.  Move to shrinker as outpt  -Patient to see Rehab MD/provider in the office for transitional care encounter in 2-4 weeks.  2.  Antithrombotics: -DVT/anticoagulation:  SCD left lower extremity             -antiplatelet therapy: aspirin 81 mg daily 3. Pain Management:   -Neurontin 300 mg 3 times a day  -Robaxin as needed.patient  -no narcotics at this time due to increased sensitivity to pain medication     4. Mood: Lexapro 10 mg daily             -antipsychotic agents: N/A 5. Neuropsych: This patient is not yet fully capable of making decisions on her own behalf. 6. Skin/Wound Care:  Routine skin checks.     CONTINUE ACE  -  PRAFO boot for further protection of left heel, transfers  -foam dressing for heel sore as well 7. Fluids/Electrolytes/Nutrition:  Routine ins and outs 8. Acute on chronic anemia.   Hemoglobin up to 9.4 on 5/4 9. Diabetes mellitus peripheral neuropathy. Hemoglobin A1c 10.5.     Lantus insulin 40 units daily at bedtime.,  Decreased to 35, decreased to 20 on 5/3---decreased to 15u QHS on 5/4---decrease to 10u tonight  -added AM lantus 5u  beginning 5/6  -dc scheduled novolog with meals to help simplify regimen 10. CAD with CABG. Continue aspirin. No chest pain or shortness of breath 11. Chronic diastolic congestive heart failure.  Monitor for signs and symptoms of fluid overload.  Filed Weights   01/12/19 0500 01/13/19 0407 01/14/19 0500  Weight: 67.4 kg 67.6 kg 66.7 kg   Stable on 5/8 12. Hypertension. Imdur 15 mg daily, Norvasc 5 mg daily.  Monitor with increased mobility.  Fairly Controlled 5/8 13. Hyperlipidemia. Lipitor 14. Hypothyroidism. Synthroid 15.Chronic kidney disease.  Creatinine baseline 1.32-2.04.  Creatinine 1.21 5/8  Continue to monitor 16. Constipation. Laxative assistance 17. Nausea:?  Improving  Continue meds  LOS: 9 days A FACE TO FACE EVALUATION WAS PERFORMED  [name] 01/14/2019, 9:55 AM

## 2019-01-14 NOTE — Progress Notes (Signed)
Occupational Therapy Session Note  Patient Details  Name: [name] MRN: [id number] Date of Birth: 01-15-1966  Today's Date: 01/14/2019 OT Individual Time: 4166-0630 OT Individual Time Calculation (min): 45 min    Short Term Goals: Week 1:  OT Short Term Goal 1 (Week 1): Pt will transfer to [facility] wiht LRAD and MIN A OT Short Term Goal 2 (Week 1): Pt will thread BLE into pants with AE PRN OT Short Term Goal 3 (Week 1): Pt will complete lateral leans wiht S on commode for 1/3 steps of toileting OT Short Term Goal 4 (Week 1): Pt will advance pants past hips wiht S   Skilled Therapeutic Interventions/Progress Updates:    patient in bed and ready for therapy.  Bed mobility, sit pivot transfers to/from bed, w/c and toilet with CS/set up.  Patient able to manage arm rests and leg rests with increased time and min cues.  Grooming w.c level at sink with set up.  Toileting mod I - side to side leans to manage clothing while seated on the toilet.   Patient self propelled w/c on unit for endurance activity.  Completed UB and core stretching activities with min cues.  She remained in w/c at close of session with items within reach.    Therapy Documentation Precautions:  Precautions Precautions: Fall Precaution Comments: fragile skin on R residual limb Required Braces or Orthoses: Other Brace Other Brace: Limb protector Restrictions Weight Bearing Restrictions: Yes RLE Weight Bearing: Non weight bearing General:   Vital Signs:  Pain: Pain Assessment Pain Scale: 0-10 Pain Score: 0-No pain   Other Treatments:     Therapy/Group: Individual Therapy  Carlos Levering 01/14/2019, 12:11 PM

## 2019-01-14 NOTE — Discharge Instructions (Signed)
Inpatient Rehab Discharge Instructions  [name] Discharge date and time: No discharge date for patient encounter.   Activities/Precautions/ Functional Status: Activity: activity as tolerated Diet: diabetic diet Wound Care: keep wound clean and dry Functional status:  ___ No restrictions     ___ Walk up steps independently ___ 24/7 supervision/assistance   ___ Walk up steps with assistance ___ Intermittent supervision/assistance  ___ Bathe/dress independently ___ Walk with walker     _x__ Bathe/dress with assistance ___ Walk Independently    ___ Shower independently ___ Walk with assistance    ___ Shower with assistance ___ No alcohol     ___ Return to work/school ________     COMMUNITY REFERRALS UPON DISCHARGE:    Home Health:   PT     OT    RN                     Agency:  [facility] Phone: [phone]   Medical Equipment/Items Ordered:  Wheelchair, cushion, commode and tub bench                                                       Agency/Supplier:  [facility] @ [phone]    GENERAL COMMUNITY RESOURCES FOR PATIENT/FAMILY:  Support Groups:  Amputee Support Group (see flyer)       Special Instructions: No smoking driving or alcohol   My questions have been answered and I understand these instructions. I will adhere to these goals and the provided educational materials after my discharge from the hospital.  Patient/Caregiver Signature _______________________________ Date __________  Clinician Signature _______________________________________ Date __________  Please bring this form and your medication list with you to all your follow-up doctor's appointments.

## 2019-01-15 ENCOUNTER — Ambulatory Visit (HOSPITAL_COMMUNITY): Payer: BLUE CROSS/BLUE SHIELD

## 2019-01-15 ENCOUNTER — Encounter (HOSPITAL_COMMUNITY): Payer: BLUE CROSS/BLUE SHIELD

## 2019-01-15 LAB — GLUCOSE, CAPILLARY: Glucose-Capillary: 89 mg/dL (ref 70–99)

## 2019-01-15 NOTE — Progress Notes (Signed)
Occupational Therapy Discharge Summary  Patient Details  Name: [name] MRN: [id number] Date of Birth: 05-29-1966  Today's Date: 01/15/2019 OT Individual Time: 1100-1130 OT Individual Time Calculation (min): 30 min   1:1. Pt, son and daughter in law present for family training. OT reviews set up for EOB bathing/dressing, supervision of transfers, facilitation at hips, application of shrinker, figure 8 wrapping handout and management of w/c parts. Pt able to set up transfers without VC and OT demo toilet transfer for family. Pt family able return demo supervision of toilet transfers with no VC from OT. Family reporting w/c will not fit into either bathroom. Pt reports will sponge bathe until [facility] can problem solve accessing bathroom in the home. Family with no questions at end of session. Exited session with pt seated in w/c, call light itn reach and all needs met  Patient has met 8 of 8 long term goals due to improved activity tolerance, improved balance, postural control, ability to compensate for deficits and improved attention.  Patient to discharge at overall Supervision level.  Patient's care partner is independent to provide the necessary cognitive assistance at discharge.    Reasons goals not met: n/a  Recommendation:  Patient will benefit from ongoing skilled OT services in home health setting to continue to advance functional skills in the area of BADL.  Equipment: TTB and BSC  Reasons for discharge: treatment goals met  Patient/family agrees with progress made and goals achieved: Yes  OT Discharge Precautions/Restrictions  Precautions Precautions: Fall Precaution Comments: fragile skin on R residual limb Required Braces or Orthoses: Other Brace Other Brace: Limb protector Restrictions Weight Bearing Restrictions: Yes RLE Weight Bearing: Non weight bearing General   Vital Signs  Pain Pain Assessment Pain Scale: 0-10 Pain Score: 0-No pain Faces Pain Scale: No  hurt ADL ADL Eating: Supervision/safety Grooming: Supervision/safety Upper Body Bathing: Setup Where Assessed-Upper Body Bathing: Edge of bed Lower Body Bathing: Setup Where Assessed-Lower Body Bathing: Edge of bed Upper Body Dressing: Setup Where Assessed-Upper Body Dressing: Edge of bed Lower Body Dressing: Setup Toileting: Supervision/safety Toilet Transfer: Distant supervision Toilet Transfer Method: Engineer, water: Bedside commode Vision Baseline Vision/History: Wears glasses Wears Glasses: Reading only Vision Assessment?: No apparent visual deficits Perception  Perception: Within Functional Limits Praxis Praxis: Intact Cognition Overall Cognitive Status: Within Functional Limits for tasks assessed Arousal/Alertness: Awake/alert Orientation Level: Oriented X4 Memory: Impaired Awareness: Appears intact Problem Solving: Appears intact Safety/Judgment: Impaired Sensation Sensation Light Touch: Appears Intact Proprioception: Appears Intact Coordination Gross Motor Movements are Fluid and Coordinated: Yes Fine Motor Movements are Fluid and Coordinated: Yes Finger Nose Finger Test: WNL Motor  Motor Motor - Skilled Clinical Observations: generalized weakness Mobility  Bed Mobility Rolling Right: Independent with assistive device Rolling Left: Independent with assistive device Supine to Sit: Independent with assistive device Sit to Supine: Independent with assistive device Transfers Sit to Stand: Supervision/Verbal cueing  Trunk/Postural Assessment  Cervical Assessment Cervical Assessment: Exceptions to [facility] Thoracic Assessment Thoracic Assessment: Exceptions to [facility] Lumbar Assessment Lumbar Assessment: Exceptions to [facility] Postural Control Postural Control: Within Functional Limits  Balance Dynamic Sitting Balance Dynamic Sitting - Level of Assistance: 5: Stand by assistance Dynamic Sitting - Balance Activities: Lateral lean/weight  shifting Sitting balance - Comments: dressing Static Standing Balance Static Standing - Balance Support: Bilateral upper extremity supported;During functional activity Static Standing - Level of Assistance: 5: Stand by assistance Dynamic Standing Balance Dynamic Standing - Level of Assistance: 5: Stand by assistance Extremity/Trunk Assessment RUE Assessment  RUE Assessment: Within Functional Limits LUE Assessment LUE Assessment: Exceptions to [facility] General Strength Comments: pt with limted ROM & pain when performing overhead ROM 2/2 bone spur (per pt report)   Tonny Branch 01/15/2019, 11:02 AM

## 2019-01-15 NOTE — Progress Notes (Signed)
[place] PHYSICAL MEDICINE & REHABILITATION PROGRESS NOTE   Subjective/Complaints: Up in bed. Anxious to get home. No new problems  ROS: Patient denies fever, rash, sore throat, blurred vision, nausea, vomiting, diarrhea, cough, shortness of breath or chest pain, joint or back pain, headache, or mood change.   Objective:   No results found. Recent Labs    01/14/19 0452  WBC 9.1  HGB 8.8*  HCT 27.8*  PLT 613*   Recent Labs    01/14/19 0452  NA 138  K 3.9  CL 99  CO2 27  GLUCOSE 98  BUN 23*  CREATININE 1.21*  CALCIUM 8.9    Intake/Output Summary (Last 24 hours) at 01/15/2019 1035 Last data filed at 01/15/2019 0800 Gross per 24 hour  Intake 780 ml  Output -  Net 780 ml     Physical Exam: Vital Signs Blood pressure (!) 159/85, pulse 64, temperature 98.5 F (36.9 C), temperature source Oral, resp. rate 19, height 5\' 3"  (1.6 m), weight 66.7 kg, last menstrual period 02/12/2011, SpO2 100 %. Constitutional: No distress . Vital signs reviewed. HEENT: EOMI, oral membranes moist Neck: supple Cardiovascular: RRR without murmur. No JVD    Respiratory: CTA Bilaterally without wheezes or rales. Normal effort    GI: BS +, non-tender, non-distended  Musc: Right stump with tenderness Neurological: She is alert.   Motor: Bilateral upper extremities: 4+/5 proximal distal Left lower extremity: 4+/5 proximal to distal, stable Right lower extremity: Hip flexion 4/5 stable Skin: incision CDI, fragile areas along incision are stable Psychiatric: in good spirits  Assessment/Plan: 1. Functional deficits secondary to Right BKA which require 3+ hours per day of interdisciplinary therapy in a comprehensive inpatient rehab setting.  Physiatrist is providing close team supervision and 24 hour management of active medical problems listed below.  Physiatrist and rehab team continue to assess barriers to discharge/monitor patient progress toward functional and medical goals  Care  Tool:  Bathing    Body parts bathed by patient: Right arm, Left arm, Chest, Abdomen, Front perineal area, Buttocks, Right upper leg, Left upper leg, Face   Body parts bathed by helper: Left lower leg Body parts n/a: Right lower leg   Bathing assist Assist Level: Minimal Assistance - Patient > 75%     Upper Body Dressing/Undressing Upper body dressing   What is the patient wearing?: Pull over shirt    Upper body assist Assist Level: Supervision/Verbal cueing    Lower Body Dressing/Undressing Lower body dressing      What is the patient wearing?: Ace wrap/stump shrinker     Lower body assist Assist for lower body dressing: Supervision/Verbal cueing     Toileting Toileting    Toileting assist Assist for toileting: Supervision/Verbal cueing     Transfers Chair/bed transfer  Transfers assist     Chair/bed transfer assist level: Supervision/Verbal cueing     Locomotion Ambulation   Ambulation assist   Ambulation activity did not occur: Safety/medical concerns          Walk 10 feet activity   Assist  Walk 10 feet activity did not occur: Safety/medical concerns        Walk 50 feet activity   Assist Walk 50 feet with 2 turns activity did not occur: Safety/medical concerns         Walk 150 feet activity   Assist Walk 150 feet activity did not occur: Safety/medical concerns         Walk 10 feet on uneven surface  activity  Assist Walk 10 feet on uneven surfaces activity did not occur: Safety/medical concerns         Wheelchair     Assist Will patient use wheelchair at discharge?: Yes Type of Wheelchair: Manual    Wheelchair assist level: Independent Max wheelchair distance: 150 ft    Wheelchair 50 feet with 2 turns activity    Assist        Assist Level: Independent   Wheelchair 150 feet activity     Assist Wheelchair 150 feet activity did not occur: Safety/medical concerns   Assist Level: Independent     Medical Problem List and Plan: 1.  Decreased functional mobility secondary to right BKA on 12/31/2018. Wound VAC as directed.  -dc home today  -WOUND/INCISION IS TOO FRAGILE TO APPLY SHRINKER. CONTINUE ACE WRAP FOR SHORT TERM.  Move to shrinker as outpt  -Patient to see Rehab MD/provider in the office for transitional care encounter in 2-4 weeks.  Surgical follow up for staples  2.  Antithrombotics: -DVT/anticoagulation:  SCD left lower extremity             -antiplatelet therapy: aspirin 81 mg daily 3. Pain Management:   -Neurontin 300 mg 3 times a day  -Robaxin as needed.patient  -no narcotics at this time due to increased sensitivity to pain medication     4. Mood: Lexapro 10 mg daily             -antipsychotic agents: N/A 5. Neuropsych: This patient is not yet fully capable of making decisions on her own behalf. 6. Skin/Wound Care:  Routine skin checks.     CONTINUE ACE  -  PRAFO boot for further protection of left heel, transfers  -foam dressing for heel sore as well 7. Fluids/Electrolytes/Nutrition:  Routine ins and outs 8. Acute on chronic anemia.   Hemoglobin up to 9.4 on 5/4 9. Diabetes mellitus peripheral neuropathy. Hemoglobin A1c 10.5.     Lantus insulin 40 units daily at bedtime.,  Decreased to 35, decreased to 20 on 5/3---decreased to 15u QHS on 5/4---decreased to 10u 5/9  -added AM lantus 5u  beginning 5/6  -reasonable control 10. CAD with CABG. Continue aspirin. No chest pain or shortness of breath 11. Chronic diastolic congestive heart failure.  Monitor for signs and symptoms of fluid overload.  Filed Weights   01/12/19 0500 01/13/19 0407 01/14/19 0500  Weight: 67.4 kg 67.6 kg 66.7 kg   Stable on 5/9. No peripheral edema, lungs clear 12. Hypertension. Imdur 15 mg daily, Norvasc 5 mg daily.  Monitor with increased mobility.  Fairly Controlled 5/8 13. Hyperlipidemia. Lipitor 14. Hypothyroidism. Synthroid 15.Chronic kidney disease. Creatinine baseline  1.32-2.04.  Creatinine 1.21 5/8  Continue to monitor 16. Constipation. Laxative assistance   LOS: 10 days A FACE TO [place] 01/15/2019, 10:35 AM

## 2019-01-15 NOTE — Plan of Care (Signed)
  Problem: RH BOWEL ELIMINATION Goal: RH STG MANAGE BOWEL WITH ASSISTANCE Description STG Manage Bowel with Assistance. Min  Outcome: Completed/Met Goal: RH STG MANAGE BOWEL W/MEDICATION W/ASSISTANCE Description STG Manage Bowel with Medication with Assistance. Min  Outcome: Completed/Met   Problem: RH BLADDER ELIMINATION Goal: RH STG MANAGE BLADDER WITH ASSISTANCE Description STG Manage Bladder With Assistance min  Outcome: Completed/Met   Problem: RH SKIN INTEGRITY Goal: RH STG SKIN FREE OF INFECTION/BREAKDOWN Description Free of skin breakdown and infection with min assist  Outcome: Completed/Met Goal: RH STG MAINTAIN SKIN INTEGRITY WITH ASSISTANCE Description STG Maintain Skin Integrity With Assistance. min  Outcome: Completed/Met   Problem: RH PAIN MANAGEMENT Goal: RH STG PAIN MANAGED AT OR BELOW PT'S PAIN GOAL Description Less than 3  Outcome: Completed/Met

## 2019-01-15 NOTE — Progress Notes (Signed)
Physical Therapy Session Note  Patient Details  Name: BLAKELEIGH DOMEK MRN: [id number] Date of Birth: Aug 02, 1966  Today's Date: 01/15/2019 PT Individual Time: 1131-1200 PT Individual Time Calculation (min): 29 min   Short Term Goals: Week 1:  PT Short Term Goal 1 (Week 1): pt will perform functional transfers with min A PT Short Term Goal 2 (Week 1): pt will perform w/c parts management with supervision  Skilled Therapeutic Interventions/Progress Updates:    Pt seated in w/c upon PT arrival, agreeable to therapy tx and denies pain. Pt's family present this session for family education. Pt propelled w/c throughout unit independently using B UEs. Pt's family educated on limb ace wrapping technique and provided handout, therapist demonstrated ace wrapping and then each family member performed ace wrapping technique. Pt performed car transfer this session with supervision and education on techniques. Pt performed couch transfer with supervision and therapist educating family on techniques. Pt left seated in w/c at end of session with needs in reach and family present.   Therapy Documentation Precautions:  Precautions Precautions: Fall Precaution Comments: fragile skin on R residual limb Required Braces or Orthoses: Other Brace Other Brace: Limb protector Restrictions Weight Bearing Restrictions: Yes RLE Weight Bearing: Non weight bearing   Therapy/Group: Individual Therapy  Netta Corrigan, PT, DPT 01/15/2019, 7:59 AM

## 2019-01-15 NOTE — Progress Notes (Signed)
Patient discharged during shift. Eduction provided to family for right bka ace wrap and education.

## 2019-01-17 ENCOUNTER — Telehealth: Payer: Self-pay | Admitting: *Deleted

## 2019-01-17 DIAGNOSIS — E1151 Type 2 diabetes mellitus with diabetic peripheral angiopathy without gangrene: Secondary | ICD-10-CM | POA: Diagnosis not present

## 2019-01-17 DIAGNOSIS — Z89511 Acquired absence of right leg below knee: Secondary | ICD-10-CM | POA: Diagnosis not present

## 2019-01-17 DIAGNOSIS — E1122 Type 2 diabetes mellitus with diabetic chronic kidney disease: Secondary | ICD-10-CM | POA: Diagnosis not present

## 2019-01-17 DIAGNOSIS — I2510 Atherosclerotic heart disease of native coronary artery without angina pectoris: Secondary | ICD-10-CM | POA: Diagnosis not present

## 2019-01-17 DIAGNOSIS — T8743 Infection of amputation stump, right lower extremity: Secondary | ICD-10-CM | POA: Diagnosis not present

## 2019-01-17 DIAGNOSIS — G546 Phantom limb syndrome with pain: Secondary | ICD-10-CM | POA: Diagnosis not present

## 2019-01-17 DIAGNOSIS — I872 Venous insufficiency (chronic) (peripheral): Secondary | ICD-10-CM | POA: Diagnosis not present

## 2019-01-17 DIAGNOSIS — L89890 Pressure ulcer of other site, unstageable: Secondary | ICD-10-CM | POA: Diagnosis not present

## 2019-01-17 DIAGNOSIS — Z4781 Encounter for orthopedic aftercare following surgical amputation: Secondary | ICD-10-CM | POA: Diagnosis not present

## 2019-01-17 DIAGNOSIS — I130 Hypertensive heart and chronic kidney disease with heart failure and stage 1 through stage 4 chronic kidney disease, or unspecified chronic kidney disease: Secondary | ICD-10-CM | POA: Diagnosis not present

## 2019-01-17 DIAGNOSIS — N183 Chronic kidney disease, stage 3 (moderate): Secondary | ICD-10-CM | POA: Diagnosis not present

## 2019-01-17 DIAGNOSIS — I5032 Chronic diastolic (congestive) heart failure: Secondary | ICD-10-CM | POA: Diagnosis not present

## 2019-01-17 DIAGNOSIS — E785 Hyperlipidemia, unspecified: Secondary | ICD-10-CM | POA: Diagnosis not present

## 2019-01-17 NOTE — Telephone Encounter (Signed)
Malachy Mood from [facility] called to verify that Dr Naaman Plummer would be signing [employer] orders. I let her know this is correct.

## 2019-01-18 ENCOUNTER — Telehealth: Payer: Self-pay | Admitting: *Deleted

## 2019-01-18 DIAGNOSIS — L89890 Pressure ulcer of other site, unstageable: Secondary | ICD-10-CM | POA: Diagnosis not present

## 2019-01-18 DIAGNOSIS — G546 Phantom limb syndrome with pain: Secondary | ICD-10-CM | POA: Diagnosis not present

## 2019-01-18 DIAGNOSIS — I2510 Atherosclerotic heart disease of native coronary artery without angina pectoris: Secondary | ICD-10-CM | POA: Diagnosis not present

## 2019-01-18 DIAGNOSIS — T8743 Infection of amputation stump, right lower extremity: Secondary | ICD-10-CM | POA: Diagnosis not present

## 2019-01-18 DIAGNOSIS — I130 Hypertensive heart and chronic kidney disease with heart failure and stage 1 through stage 4 chronic kidney disease, or unspecified chronic kidney disease: Secondary | ICD-10-CM | POA: Diagnosis not present

## 2019-01-18 DIAGNOSIS — I872 Venous insufficiency (chronic) (peripheral): Secondary | ICD-10-CM | POA: Diagnosis not present

## 2019-01-18 DIAGNOSIS — I5032 Chronic diastolic (congestive) heart failure: Secondary | ICD-10-CM | POA: Diagnosis not present

## 2019-01-18 DIAGNOSIS — N183 Chronic kidney disease, stage 3 (moderate): Secondary | ICD-10-CM | POA: Diagnosis not present

## 2019-01-18 DIAGNOSIS — E1151 Type 2 diabetes mellitus with diabetic peripheral angiopathy without gangrene: Secondary | ICD-10-CM | POA: Diagnosis not present

## 2019-01-18 DIAGNOSIS — E785 Hyperlipidemia, unspecified: Secondary | ICD-10-CM | POA: Diagnosis not present

## 2019-01-18 DIAGNOSIS — E1122 Type 2 diabetes mellitus with diabetic chronic kidney disease: Secondary | ICD-10-CM | POA: Diagnosis not present

## 2019-01-18 DIAGNOSIS — Z89511 Acquired absence of right leg below knee: Secondary | ICD-10-CM | POA: Diagnosis not present

## 2019-01-18 DIAGNOSIS — Z4781 Encounter for orthopedic aftercare following surgical amputation: Secondary | ICD-10-CM | POA: Diagnosis not present

## 2019-01-18 NOTE — Telephone Encounter (Signed)
Transitional Care call-I SPOKE WITH [name]    1. Are you/is patient experiencing any problems since coming home? Are there any questions regarding any aspect of care? NO 2. Are there any questions regarding medications administration/dosing? Are meds being taken as prescribed? Patient should review meds with caller to confirm YES SHE HAS ALL MEDICATIONS 3. Have there been any falls? NO 4. Has Home Health been to the house and/or have they contacted you? If not, have you tried to contact them? Can we help you contact them? YES 5. Are bowels and bladder emptying properly? Are there any unexpected incontinence issues? If applicable, is patient following bowel/bladder programs?NO PROBLEMS 6. Any fevers, problems with breathing, unexpected pain? NO SHE WAS SICK LAST NIGHT VOMITING BUT FEELS BETTER TODAY.  HHRN CHECKED HER, NO FEVER AND LUNGS SOUND GOOD. 7. Are there any skin problems or new areas of breakdown? NO 8. Has the patient/family member arranged specialty MD follow up (ie cardiology/neurology/renal/surgical/etc)?  Can we help arrange? APPT WITH DR [place] PCP 01/25/19, APPT GIVEN WITH OUR OFFICE TODAY AND SHE NEEDS TO CALL DR DUDA SINCE THEY HAVE NOT CALLED HER YET. 9. Does the patient need any other services or support that we can help arrange? NO 10. Are caregivers following through as expected in assisting the patient? YES 11. Has the patient quit smoking, drinking alcohol, or using drugs as recommended? YES  Appointment Friday 01/28/19 ARRIVE BY 9:20 FOR 9:40 WITH Tiffany Sensing NP, AND DR SWARTZ THEREAFTER. ADDRESS REVIEWED AND ALERTED TO WATCH FOR PACKET FROM OFFICE IN MAIL. [phone]

## 2019-01-19 DIAGNOSIS — E1122 Type 2 diabetes mellitus with diabetic chronic kidney disease: Secondary | ICD-10-CM | POA: Diagnosis not present

## 2019-01-19 DIAGNOSIS — L89890 Pressure ulcer of other site, unstageable: Secondary | ICD-10-CM | POA: Diagnosis not present

## 2019-01-19 DIAGNOSIS — I130 Hypertensive heart and chronic kidney disease with heart failure and stage 1 through stage 4 chronic kidney disease, or unspecified chronic kidney disease: Secondary | ICD-10-CM | POA: Diagnosis not present

## 2019-01-19 DIAGNOSIS — Z89511 Acquired absence of right leg below knee: Secondary | ICD-10-CM | POA: Diagnosis not present

## 2019-01-19 DIAGNOSIS — I5032 Chronic diastolic (congestive) heart failure: Secondary | ICD-10-CM | POA: Diagnosis not present

## 2019-01-19 DIAGNOSIS — I2510 Atherosclerotic heart disease of native coronary artery without angina pectoris: Secondary | ICD-10-CM | POA: Diagnosis not present

## 2019-01-19 DIAGNOSIS — E785 Hyperlipidemia, unspecified: Secondary | ICD-10-CM | POA: Diagnosis not present

## 2019-01-19 DIAGNOSIS — E1151 Type 2 diabetes mellitus with diabetic peripheral angiopathy without gangrene: Secondary | ICD-10-CM | POA: Diagnosis not present

## 2019-01-19 DIAGNOSIS — N183 Chronic kidney disease, stage 3 (moderate): Secondary | ICD-10-CM | POA: Diagnosis not present

## 2019-01-19 DIAGNOSIS — G546 Phantom limb syndrome with pain: Secondary | ICD-10-CM | POA: Diagnosis not present

## 2019-01-19 DIAGNOSIS — T8743 Infection of amputation stump, right lower extremity: Secondary | ICD-10-CM | POA: Diagnosis not present

## 2019-01-19 DIAGNOSIS — I872 Venous insufficiency (chronic) (peripheral): Secondary | ICD-10-CM | POA: Diagnosis not present

## 2019-01-19 DIAGNOSIS — Z4781 Encounter for orthopedic aftercare following surgical amputation: Secondary | ICD-10-CM | POA: Diagnosis not present

## 2019-01-20 DIAGNOSIS — T8743 Infection of amputation stump, right lower extremity: Secondary | ICD-10-CM | POA: Diagnosis not present

## 2019-01-20 DIAGNOSIS — E785 Hyperlipidemia, unspecified: Secondary | ICD-10-CM | POA: Diagnosis not present

## 2019-01-20 DIAGNOSIS — G546 Phantom limb syndrome with pain: Secondary | ICD-10-CM | POA: Diagnosis not present

## 2019-01-20 DIAGNOSIS — N183 Chronic kidney disease, stage 3 (moderate): Secondary | ICD-10-CM | POA: Diagnosis not present

## 2019-01-20 DIAGNOSIS — E1122 Type 2 diabetes mellitus with diabetic chronic kidney disease: Secondary | ICD-10-CM | POA: Diagnosis not present

## 2019-01-20 DIAGNOSIS — E1151 Type 2 diabetes mellitus with diabetic peripheral angiopathy without gangrene: Secondary | ICD-10-CM | POA: Diagnosis not present

## 2019-01-20 DIAGNOSIS — Z4781 Encounter for orthopedic aftercare following surgical amputation: Secondary | ICD-10-CM | POA: Diagnosis not present

## 2019-01-20 DIAGNOSIS — I2510 Atherosclerotic heart disease of native coronary artery without angina pectoris: Secondary | ICD-10-CM | POA: Diagnosis not present

## 2019-01-20 DIAGNOSIS — I872 Venous insufficiency (chronic) (peripheral): Secondary | ICD-10-CM | POA: Diagnosis not present

## 2019-01-20 DIAGNOSIS — I5032 Chronic diastolic (congestive) heart failure: Secondary | ICD-10-CM | POA: Diagnosis not present

## 2019-01-20 DIAGNOSIS — Z89511 Acquired absence of right leg below knee: Secondary | ICD-10-CM | POA: Diagnosis not present

## 2019-01-20 DIAGNOSIS — I130 Hypertensive heart and chronic kidney disease with heart failure and stage 1 through stage 4 chronic kidney disease, or unspecified chronic kidney disease: Secondary | ICD-10-CM | POA: Diagnosis not present

## 2019-01-20 DIAGNOSIS — L89890 Pressure ulcer of other site, unstageable: Secondary | ICD-10-CM | POA: Diagnosis not present

## 2019-01-21 DIAGNOSIS — Z4801 Encounter for change or removal of surgical wound dressing: Secondary | ICD-10-CM | POA: Diagnosis not present

## 2019-01-21 DIAGNOSIS — L89620 Pressure ulcer of left heel, unstageable: Secondary | ICD-10-CM | POA: Diagnosis not present

## 2019-01-24 DIAGNOSIS — N183 Chronic kidney disease, stage 3 (moderate): Secondary | ICD-10-CM | POA: Diagnosis not present

## 2019-01-24 DIAGNOSIS — I872 Venous insufficiency (chronic) (peripheral): Secondary | ICD-10-CM | POA: Diagnosis not present

## 2019-01-24 DIAGNOSIS — T8743 Infection of amputation stump, right lower extremity: Secondary | ICD-10-CM | POA: Diagnosis not present

## 2019-01-24 DIAGNOSIS — E1122 Type 2 diabetes mellitus with diabetic chronic kidney disease: Secondary | ICD-10-CM | POA: Diagnosis not present

## 2019-01-24 DIAGNOSIS — I130 Hypertensive heart and chronic kidney disease with heart failure and stage 1 through stage 4 chronic kidney disease, or unspecified chronic kidney disease: Secondary | ICD-10-CM | POA: Diagnosis not present

## 2019-01-24 DIAGNOSIS — I5032 Chronic diastolic (congestive) heart failure: Secondary | ICD-10-CM | POA: Diagnosis not present

## 2019-01-24 DIAGNOSIS — E1151 Type 2 diabetes mellitus with diabetic peripheral angiopathy without gangrene: Secondary | ICD-10-CM | POA: Diagnosis not present

## 2019-01-24 DIAGNOSIS — L89890 Pressure ulcer of other site, unstageable: Secondary | ICD-10-CM | POA: Diagnosis not present

## 2019-01-24 DIAGNOSIS — I2510 Atherosclerotic heart disease of native coronary artery without angina pectoris: Secondary | ICD-10-CM | POA: Diagnosis not present

## 2019-01-24 DIAGNOSIS — G546 Phantom limb syndrome with pain: Secondary | ICD-10-CM | POA: Diagnosis not present

## 2019-01-24 DIAGNOSIS — E785 Hyperlipidemia, unspecified: Secondary | ICD-10-CM | POA: Diagnosis not present

## 2019-01-24 DIAGNOSIS — Z4781 Encounter for orthopedic aftercare following surgical amputation: Secondary | ICD-10-CM | POA: Diagnosis not present

## 2019-01-24 DIAGNOSIS — Z89511 Acquired absence of right leg below knee: Secondary | ICD-10-CM | POA: Diagnosis not present

## 2019-01-25 ENCOUNTER — Ambulatory Visit (INDEPENDENT_AMBULATORY_CARE_PROVIDER_SITE_OTHER): Payer: BLUE CROSS/BLUE SHIELD | Admitting: Family Medicine

## 2019-01-25 ENCOUNTER — Other Ambulatory Visit: Payer: Self-pay

## 2019-01-25 ENCOUNTER — Encounter: Payer: Self-pay | Admitting: Family Medicine

## 2019-01-25 DIAGNOSIS — E119 Type 2 diabetes mellitus without complications: Secondary | ICD-10-CM

## 2019-01-25 DIAGNOSIS — I10 Essential (primary) hypertension: Secondary | ICD-10-CM

## 2019-01-25 DIAGNOSIS — Z89511 Acquired absence of right leg below knee: Secondary | ICD-10-CM

## 2019-01-25 DIAGNOSIS — E039 Hypothyroidism, unspecified: Secondary | ICD-10-CM | POA: Diagnosis not present

## 2019-01-25 MED ORDER — INSULIN GLARGINE 100 UNIT/ML SOLOSTAR PEN: 15.0000 [IU] | PEN_INJECTOR | Freq: Two times a day (BID) | SUBCUTANEOUS | 5 refills | Status: DC

## 2019-01-25 MED ORDER — ONDANSETRON HCL 4 MG PO TABS: 4.0000 mg | ORAL_TABLET | Freq: Two times a day (BID) | ORAL | 1 refills | Status: DC | PRN

## 2019-01-25 MED ORDER — INSULIN REGULAR HUMAN 100 UNIT/ML IJ SOLN: 4.0000 [IU] | Freq: Every evening | INTRAMUSCULAR | 2 refills | Status: DC

## 2019-01-25 NOTE — Assessment & Plan Note (Signed)
Has been home a week and [facility] came to the home initially but they are having to hold off of PT due to a lesion on her left heel and they have to rest it. So with R TKR she is essentially WC bound.

## 2019-01-25 NOTE — Progress Notes (Signed)
Virtual Visit via Telephone Note  I connected with [name] on 01/25/19 at 10:00 AM EDT by a telephone enabled telemedicine application and verified that I am speaking with the correct person using two identifiers. [name], CMA attempted to get patient set up on video platform but was unable to complete so visit was completed over the telephone.   Location: Patient: home Provider: home   I discussed the limitations of evaluation and management by telemedicine and the availability of in person appointments. The patient expressed understanding and agreed to proceed.    Subjective:    [name], female    DOB: [birth year], 53 y.o.   MRN: [id number]  Chief Complaint  Patient presents with  . Hospitalization Follow-up    amputation, Pt states having some nausea in the morning.    HPI Patient is in today for follow-up on chronic medical concerns including diabetes mellitus, hypertension, hyperlipidemia and status post a recent right BKA.  She is back home for the past week trying to manage her ADLs with the help of her family and is doing fairly well.  She has developed a bad bone bruise and blister on her left heel as she has had to compensate so she is essentially wheelchair-bound at this time but hoping to get moving soon.  She had physical therapy in the hospital before being dismissed and is now awaiting the healing of her left heel before she can further increase her mobility.  She is still struggling with anhedonia and grief secondary to the loss of her husband.  No recent febrile illness or other acute complaints are noted. Denies CP/palp/SOB/HA/congestion/fevers/GI or GU c/o. Taking meds as prescribed  Past Medical History:  Diagnosis Date  . Acute on chronic diastolic CHF (congestive heart failure) ([place]) 06/24/2018  . Anxiety   . COMMON MIGRAINE 10/07/2010  . Decreased visual acuity 11/10/2016  . Depression 12/18/2012  . Diabetes mellitus type II,  uncontrolled ([place]) 10/07/2010   Qualifier: Diagnosis of  By: Charlett Blake MD, [name]    . Diabetic foot infection ([place]) 08/26/2016  . Disturbance of skin sensation 10/07/2010  . Gangrene of right foot ([place])   . Heart murmur   . History of kidney stones    "years ago"  . Hyperlipidemia 12/06/2010  . Hypertension   . Lipoma of abdominal wall 10/05/2016  . Overweight(278.02) 12/06/2010  . PERIPHERAL NEUROPATHY, FEET 10/07/2010  . PVD (peripheral vascular disease) ([place]) 01/21/2012  . RESTLESS LEG SYNDROME 10/25/2010  . Stroke [facility]) 2014, 2017   most recently in 2/17 - intracerebral hemorrhage    Past Surgical History:  Procedure Laterality Date  . AMPUTATION Right 12/13/2018   Procedure: AMPUTATION RIGHT GREAT TOE, LOCAL RELOCATION OF TISSUE FOR WOUND CLOSURE 9cm x 3cm, VAC APPLICATION;  Surgeon: [name], MD;  Location: [place];  Service: Orthopedics;  Laterality: Right;  . AMPUTATION Right 12/31/2018   Procedure: RIGHT BELOW KNEE AMPUTATION;  Surgeon: [name], MD;  Location: [place];  Service: Orthopedics;  Laterality: Right;  . AMPUTATION TOE Left 09/09/2016   Procedure: AMPUTATION OF LEFT GREAT TOE;  Surgeon: Milly Jakob, MD;  Location: [facility];  Service: Orthopedics;  Laterality: Left;  . CARDIAC CATHETERIZATION  06/24/2018  . CORONARY ARTERY BYPASS GRAFT N/A 06/28/2018   Procedure: CORONARY ARTERY BYPASS GRAFTING (CABG) times  four, using left internal mammary artery, endoscopically harvested right saphenous vein, and harvested left radial artery;  Surgeon: [name], MD;  Location:  [facility];  Service: Open Heart Surgery;  Laterality: N/A;  . ENDOVEIN HARVEST OF GREATER SAPHENOUS VEIN Right 06/28/2018   Procedure: ENDOVEIN HARVEST OF GREATER SAPHENOUS VEIN;  Surgeon: [name], MD;  Location: [facility];  Service: Open Heart Surgery;  Laterality: Right;  . LEFT HEART CATH AND CORONARY ANGIOGRAPHY N/A 06/24/2018   Procedure: LEFT HEART CATH AND CORONARY  ANGIOGRAPHY;  Surgeon: Troy Sine, MD;  Location: [facility];  Service: Cardiovascular;  Laterality: N/A;  . RADIAL ARTERY HARVEST Left 06/28/2018   Procedure: RADIAL ARTERY HARVEST;  Surgeon: [name], MD;  Location: [facility] [place];  Service: Open Heart Surgery;  Laterality: Left;  . TEE WITHOUT CARDIOVERSION N/A 06/28/2018   Procedure: TRANSESOPHAGEAL ECHOCARDIOGRAM (TEE);  Surgeon: [name], MD;  Location: [facility];  Service: Open Heart Surgery;  Laterality: N/A;  . WISDOM TOOTH EXTRACTION      Family History  Problem Relation Age of Onset  . Arthritis Mother   . Stroke Brother        previous smoker  . Alcohol abuse Brother        in remission  . Leukemia Brother   . Diabetes Paternal Grandmother   . Healthy Son     Social History   Socioeconomic History  . Marital status: Single    Spouse name: Not on file  . Number of children: Not on file  . Years of education: Not on file  . Highest education level: Not on file  Occupational History  . Occupation: Glass blower/designer  Social Needs  . Financial resource strain: Not on file  . Food insecurity:    Worry: Not on file    Inability: Not on file  . Transportation needs:    Medical: Not on file    Non-medical: Not on file  Tobacco Use  . Smoking status: Former Smoker    Packs/day: 0.50    Last attempt to quit: 09/08/2005    Years since quitting: 13.3  . Smokeless tobacco: Never Used  Substance and Sexual Activity  . Alcohol use: No    Alcohol/week: 0.0 standard drinks  . Drug use: Not Currently  . Sexual activity: Yes    Partners: Male    Birth control/protection: None  Lifestyle  . Physical activity:    Days per week: Not on file    Minutes per session: Not on file  . Stress: Not on file  Relationships  . Social connections:    Talks on phone: Not on file    Gets together: Not on file    Attends religious service: Not on file    Active member of club or organization: Not on file     Attends meetings of clubs or organizations: Not on file    Relationship status: Not on file  . Intimate partner violence:    Fear of current or ex partner: Not on file    Emotionally abused: Not on file    Physically abused: Not on file    Forced sexual activity: Not on file  Other Topics Concern  . Not on file  Social History Narrative   Lives with husband in a two story home.  Has one child.     Works as a Glass blower/designer.     Education: high school.    Outpatient Medications Prior to Visit  Medication Sig Dispense Refill  . acetaminophen (TYLENOL) 325 MG tablet Take 2 tablets (650 mg total) by mouth every 6 (six) hours  as needed for mild pain or headache.    Marland Kitchen amitriptyline (ELAVIL) 10 MG tablet Take 0.5 tablets (5 mg total) by mouth at bedtime. 30 tablet 0  . amLODipine (NORVASC) 5 MG tablet Take 1 tablet (5 mg total) by mouth daily. 30 tablet 1  . aspirin EC 81 MG tablet Take 81 mg by mouth daily.    Marland Kitchen atorvastatin (LIPITOR) 80 MG tablet Take 1 tablet (80 mg total) by mouth daily at 6 PM. 30 tablet 1  . azelastine (ASTELIN) 0.1 % nasal spray Place 2 sprays into both nostrils 2 (two) times daily. Use in each nostril as directed (Patient taking differently: Place 2 sprays into both nostrils 2 (two) times daily as needed for allergies. Use in each nostril as directed) 30 mL 3  . Blood Glucose Monitoring Suppl (ONE TOUCH ULTRA 2) w/Device KIT Check blood sugars twice daily. Dx:E11.59 1 each 0  . docusate sodium (COLACE) 100 MG capsule Take 1 capsule (100 mg total) by mouth 2 (two) times daily. 10 capsule 0  . escitalopram (LEXAPRO) 10 MG tablet Take 1 tablet (10 mg total) by mouth daily. 30 tablet 0  . fluticasone (FLONASE) 50 MCG/ACT nasal spray SPRAY 2 SPRAYS INTO EACH NOSTRIL EVERY DAY (Patient taking differently: Place 2 sprays into both nostrils daily as needed for allergies. ) 16 g 6  . Insulin Pen Needle (PEN NEEDLES) 31G X 5 MM MISC 1 Syringe by Does not apply route daily. To use  with Lantus Pen 100 each 1  . isosorbide mononitrate (IMDUR) 30 MG 24 hr tablet Take 0.5 tablets (15 mg total) by mouth daily. For one month then stop. 30 tablet 0  . levothyroxine (SYNTHROID) 25 MCG tablet TAKE 1 TABLET (25 MCG TOTAL) BY MOUTH DAILY BEFORE BREAKFAST. 90 tablet 1  . metoCLOPramide (REGLAN) 5 MG tablet Take 1 tablet (5 mg total) by mouth 4 (four) times daily -  before meals and at bedtime. 90 tablet 0  . metoprolol tartrate (LOPRESSOR) 25 MG tablet Take 1 tablet (25 mg total) by mouth 2 (two) times daily. 60 tablet 1  . Multiple Vitamin (MULTIVITAMIN WITH MINERALS) TABS tablet Take 1 tablet by mouth daily.    . ONE TOUCH ULTRA TEST test strip USE ONCE DAILY TO CHECK BLOOD SUGAR. DX E11.9 100 each 2  . pantoprazole (PROTONIX) 40 MG tablet Take 1 tablet (40 mg total) by mouth daily. 30 tablet 0  . Insulin Glargine (LANTUS) 100 UNIT/ML Solostar Pen Inject 5 Units into the skin daily. 15 mL 11  . Insulin Glargine (LANTUS) 100 UNIT/ML Solostar Pen Inject 15 Units into the skin at bedtime. 15 mL 11   No facility-administered medications prior to visit.     Allergies  Allergen Reactions  . Penicillins Other (See Comments)    made pt feel sick  Did it involve swelling of the face/tongue/throat, SOB, or low BP? No Did it involve sudden or severe rash/hives, skin peeling, or any reaction on the inside of your mouth or nose? No Did you need to seek medical attention at a hospital or doctor's office? No When did it last happen?April 2020 If all above answers are "NO", may proceed with cephalosporin use.   Recardo Evangelist [Pregabalin] Other (See Comments)    MADE PATIENT VERY EMOTIONAL AND WOULD CRY EASILY  . Morphine And Related Nausea And Vomiting  . Tramadol Nausea And Vomiting    Pt cant tolerate this pain med.     Review of Systems  Constitutional: Positive for malaise/fatigue. Negative for fever.  HENT: Negative for congestion.   Eyes: Negative for blurred vision.   Respiratory: Negative for shortness of breath.   Cardiovascular: Negative for chest pain, palpitations and leg swelling.  Gastrointestinal: Negative for abdominal pain, blood in stool and nausea.  Genitourinary: Negative for dysuria and frequency.  Musculoskeletal: Positive for joint pain. Negative for falls.  Skin: Negative for rash.  Neurological: Negative for dizziness, loss of consciousness and headaches.  Endo/Heme/Allergies: Negative for environmental allergies.  Psychiatric/Behavioral: Positive for depression. The patient is nervous/anxious.        Objective:    Physical Exam Unable to obtain due to telephone visit.   LMP 02/12/2011 (Exact Date)  Wt Readings from Last 3 Encounters:  01/14/19 147 lb 0.8 oz (66.7 kg)  12/31/18 129 lb 13.6 oz (58.9 kg)  12/30/18 159 lb (72.1 kg)    Diabetic Foot Exam - Simple   No data filed     Lab Results  Component Value Date   WBC 9.1 01/14/2019   HGB 8.8 (L) 01/14/2019   HCT 27.8 (L) 01/14/2019   PLT 613 (H) 01/14/2019   GLUCOSE 98 01/14/2019   CHOL 200 06/24/2018   TRIG 127 06/24/2018   HDL 36 (L) 06/24/2018   LDLDIRECT 133.0 03/02/2017   LDLCALC 139 (H) 06/24/2018   ALT 10 01/06/2019   AST 23 01/06/2019   NA 138 01/14/2019   K 3.9 01/14/2019   CL 99 01/14/2019   CREATININE 1.21 (H) 01/14/2019   BUN 23 (H) 01/14/2019   CO2 27 01/14/2019   TSH 3.396 07/02/2018   INR 1.30 06/28/2018   HGBA1C 10.5 (H) 12/12/2018   MICROALBUR 174.3 (H) 08/14/2017    Lab Results  Component Value Date   TSH 3.396 07/02/2018   Lab Results  Component Value Date   WBC 9.1 01/14/2019   HGB 8.8 (L) 01/14/2019   HCT 27.8 (L) 01/14/2019   MCV 83.2 01/14/2019   PLT 613 (H) 01/14/2019   Lab Results  Component Value Date   NA 138 01/14/2019   K 3.9 01/14/2019   CO2 27 01/14/2019   GLUCOSE 98 01/14/2019   BUN 23 (H) 01/14/2019   CREATININE 1.21 (H) 01/14/2019   BILITOT 0.6 01/06/2019   ALKPHOS 86 01/06/2019   AST 23 01/06/2019    ALT 10 01/06/2019   PROT 6.6 01/06/2019   ALBUMIN 1.8 (L) 01/06/2019   CALCIUM 8.9 01/14/2019   ANIONGAP 12 01/14/2019   GFR 63.46 07/20/2018   Lab Results  Component Value Date   CHOL 200 06/24/2018   Lab Results  Component Value Date   HDL 36 (L) 06/24/2018   Lab Results  Component Value Date   LDLCALC 139 (H) 06/24/2018   Lab Results  Component Value Date   TRIG 127 06/24/2018   Lab Results  Component Value Date   CHOLHDL 5.6 06/24/2018   Lab Results  Component Value Date   HGBA1C 10.5 (H) 12/12/2018       Assessment & Plan:   Problem List Items Addressed This Visit    History of below knee amputation, right ([place])    Has been home a week and [facility] came to the home initially but they are having to hold off of PT due to a lesion on her left heel and they have to rest it. So with R TKR she is essentially WC bound.       Hypothyroidism    On Levothyroxine, continue to monitor  Essential hypertension     no changes to meds. Encouraged heart healthy diet such as the DASH diet and exercise as tolerated. Encouraged to get a home BP cuff      Diabetes mellitus type 2 in nonobese (HCC)    minimize simple carbs. Increase exercise as tolerated. Continue current meds. Continue Lantus 15 units to bid and add Regular Insulin 4 units qhs. Avoid simple carbs and check labs regularly      Relevant Medications   Insulin Glargine (LANTUS) 100 UNIT/ML Solostar Pen   insulin regular (HUMULIN R) 100 units/mL injection      I have discontinued Rishita Petron. Urquiza's Insulin Glargine. I have also changed her Insulin Glargine. Additionally, I am having her start on ondansetron and insulin regular. Lastly, I am having her maintain her acetaminophen, azelastine, fluticasone, aspirin EC, amitriptyline, amLODipine, atorvastatin, ONE TOUCH ULTRA 2, docusate sodium, escitalopram, isosorbide mononitrate, Pen Needles, levothyroxine, metoprolol tartrate, metoCLOPramide,  multivitamin with minerals, ONE TOUCH ULTRA TEST, and pantoprazole.  Meds ordered this encounter  Medications  . ondansetron (ZOFRAN) 4 MG tablet    Sig: Take 1 tablet (4 mg total) by mouth 2 (two) times daily as needed for nausea or vomiting.    Dispense:  40 tablet    Refill:  1  . Insulin Glargine (LANTUS) 100 UNIT/ML Solostar Pen    Sig: Inject 15 Units into the skin 2 (two) times daily.    Dispense:  15 mL    Refill:  5  . insulin regular (HUMULIN R) 100 units/mL injection    Sig: Inject 0.04 mLs (4 Units total) into the skin every evening.    Dispense:  10 mL    Refill:  2     I discussed the assessment and treatment plan with the patient. The patient was provided an opportunity to ask questions and all were answered. The patient agreed with the plan and demonstrated an understanding of the instructions.   The patient was advised to call back or seek an in-person evaluation if the symptoms worsen or if the condition fails to improve as anticipated.  I provided 25 minutes of non-face-to-face time during this encounter.   Penni Homans, MD

## 2019-01-25 NOTE — Assessment & Plan Note (Signed)
On Levothyroxine, continue to monitor 

## 2019-01-25 NOTE — Assessment & Plan Note (Addendum)
no changes to meds. Encouraged heart healthy diet such as the DASH diet and exercise as tolerated. Encouraged to get a home BP cuff

## 2019-01-25 NOTE — Assessment & Plan Note (Addendum)
minimize simple carbs. Increase exercise as tolerated. Continue current meds. Continue Lantus 15 units to bid and add Regular Insulin 4 units qhs. Avoid simple carbs and check labs regularly

## 2019-01-26 ENCOUNTER — Encounter: Payer: Self-pay | Admitting: Family

## 2019-01-26 ENCOUNTER — Telehealth: Payer: Self-pay

## 2019-01-26 ENCOUNTER — Ambulatory Visit: Payer: BLUE CROSS/BLUE SHIELD | Admitting: Family

## 2019-01-26 ENCOUNTER — Ambulatory Visit (INDEPENDENT_AMBULATORY_CARE_PROVIDER_SITE_OTHER): Payer: BLUE CROSS/BLUE SHIELD | Admitting: Family

## 2019-01-26 ENCOUNTER — Other Ambulatory Visit: Payer: Self-pay

## 2019-01-26 ENCOUNTER — Ambulatory Visit: Payer: BLUE CROSS/BLUE SHIELD | Admitting: Orthopedic Surgery

## 2019-01-26 VITALS — Ht 63.0 in | Wt 147.0 lb

## 2019-01-26 DIAGNOSIS — Z89511 Acquired absence of right leg below knee: Secondary | ICD-10-CM

## 2019-01-26 NOTE — Telephone Encounter (Signed)
Copied from [place] [id number]. Topic: Quick Communication - Home Health Verbal Orders >> Jan 18, 2019 11:28 AM Sharene Skeans wrote: Caller/[facility] / [place] Number: [phone]   Was seen yesterday for her first visit and this morning the Pt is sick. Pt has been nauseous and vomiting, which started about 2am and then again around 8am, possibly due to take out food. He vital signs are BP 170/90, heart rate is 84 , temp is 97.8 and oxygen level is 99, respirations are 16 , lungs are clear, her wounds look fine. Anurse wants to know if something can be called in for nausea or if the Pt needs to be seen/ please advise asap

## 2019-01-26 NOTE — Progress Notes (Signed)
Post-Op Visit Note   Patient: [name]           Date of Birth: 28-Jul-1966           MRN: [id number] Visit Date: 01/26/2019 PCP: Mosie Lukes, MD  Chief Complaint:  Chief Complaint  Patient presents with  . Right Leg - Routine Post Op    12/31/18 BKA  D/C vac on 01/09/19 inpatient hospital. Has shrinker and limb protector but as not advised to wear.    HPI:  HPI The patient is a 53 year old woman who presents today for postoperative follow-up.  She is status post right below the knee amputation on April 24.  She was given a shrinker and limb protector on discharge.  Was not advised how to use these or when to wear.  [facility] needs.  She also has a decubitus to her left heel this has been treated with foam dressings and a PRAFO boot.  She is been advised to limit her weightbearing on the left.  Ortho Exam On examination of the right lower extremity the incision is well approximated with staples there is no gaping no drainage.  However there is eschar along the incision.  Some ischemic changes.  Good consolidation of the limb overall.  No erythema no odor.  On examination of the left heel there is a large 4 cm in diameter ulcer to the posterior heel this does extend plantarly.  Is fluid-filled and fluctuant.  This was lanced with a 10 blade knife following informed consent serosanguinous drainage. No purulence. No surrounding erythema or warmth.   Visit Diagnoses:  1. History of below knee amputation, right ([place])     Plan: daily dial soap cleansing to incision and left heel daily. Will do silvadene dressing changes to right incision every other day. Dry dressing/ foam dressings to left heel daily. Continue prafo when at rest. Up with PT.  Follow-Up Instructions: Return in about 1 week (around 02/02/2019).   Imaging: No results found.  Orders:  No orders of the defined types were placed in this encounter.  No orders of the defined  types were placed in this encounter.    PMFS History: Patient Active Problem List   Diagnosis Date Noted  . Hypoglycemia   . Diabetes mellitus type 2 in nonobese (HCC)   . Labile blood pressure   . Labile blood glucose   . Phantom limb pain ([place])   . Right below-knee amputee ([place]) 01/05/2019  . Unilateral complete BKA, right, initial encounter ([place])   . CKD (chronic kidney disease), stage III ([place])   . Hypothyroidism   . Essential hypertension   . Chronic diastolic congestive heart failure ([place])   . Coronary artery disease involving coronary bypass graft of native heart without angina pectoris   . Poorly controlled type 2 diabetes mellitus with peripheral neuropathy ([place])   . Acute on chronic anemia   . History of below knee amputation, right ([place]) 12/31/2018  . Diabetic infection of right foot ([place])   . S/P CABG x 3 06/28/2018  . Coronary artery disease involving native coronary artery of native heart with angina pectoris ([place]) 06/25/2018  . Type 2 diabetes mellitus with vascular disease ([place]) 06/24/2018  . Acute on chronic diastolic CHF (congestive heart failure) ([place]) 06/24/2018  . Non-ST elevation (NSTEMI) myocardial infarction ([place]) 06/24/2018  . Chest pain 06/23/2018  . Glaucoma 09/17/2017  . Diabetic retinopathy ([place]) 09/17/2017  . Otitis media 11/10/2016  .  Decreased visual acuity 11/10/2016  . Spinal stenosis, lumbar region with neurogenic claudication 10/22/2016  . Other spondylosis with radiculopathy, lumbar region 10/13/2016  . Lipoma of abdominal wall 10/05/2016  . Severe protein-calorie malnutrition ([place]) 09/04/2016  . Anemia of chronic disease 09/04/2016  . Diabetic foot infection ([place]) 08/26/2016  . Diabetic polyneuropathy associated with type 2 diabetes mellitus ([place]) 05/07/2016  . Orthostatic hypotension 05/07/2016  . Cerebrovascular disease 04/27/2016  . N&V (nausea and vomiting) 04/01/2016  . ICH (intracerebral hemorrhage) ([place]) 03/30/2016  . Visit for  preventive health examination 05/21/2015  . Breast cancer screening 05/21/2015  . Arm lesion 07/02/2014  . Pain in joint, ankle and foot 04/28/2014  . Pain in limb 01/13/2014  . Neuropathic pain of both legs 01/13/2014  . Hip pain 01/12/2014  . Allergic rhinitis 03/10/2013  . Back pain 03/08/2013  . Depression 12/18/2012  . PVD (peripheral vascular disease) ([place]) 01/21/2012  . Wound of left leg 12/22/2011  . Hyperlipidemia 12/06/2010  . Overweight(278.02) 12/06/2010  . RESTLESS LEG SYNDROME 10/25/2010  . Migraine without aura 10/07/2010  . Hereditary and idiopathic peripheral neuropathy 10/07/2010  . Essential hypertension, benign 10/07/2010  . DISTURBANCE OF SKIN SENSATION 10/07/2010  . HEART MURMUR, HX OF 10/07/2010  . NEPHROLITHIASIS, HX OF 10/07/2010   Past Medical History:  Diagnosis Date  . Abscess of great toe, right   . Acute on chronic diastolic CHF (congestive heart failure) ([place]) 06/24/2018  . Acute osteomyelitis of toe, left ([place]) 09/05/2016  . Amputation of right great toe ([place]) 12/22/2018  . Anxiety   . Cellulitis of foot, right 12/11/2018  . COMMON MIGRAINE 10/07/2010  . Decreased visual acuity 11/10/2016  . Depression 12/18/2012  . Diabetes mellitus type II, uncontrolled ([place]) 10/07/2010   Qualifier: Diagnosis of  By: Charlett Blake MD, [name]    . Diabetic foot infection ([place]) 08/26/2016  . Disturbance of skin sensation 10/07/2010  . Gangrene of right foot ([place])   . Heart murmur   . History of kidney stones    "years ago"  . Hyperlipidemia 12/06/2010  . Hypertension   . Lipoma of abdominal wall 10/05/2016  . Overweight(278.02) 12/06/2010  . PERIPHERAL NEUROPATHY, FEET 10/07/2010  . PVD (peripheral vascular disease) ([place]) 01/21/2012  . RESTLESS LEG SYNDROME 10/25/2010  . Stroke [facility]) 2014, 2017   most recently in 2/17 - intracerebral hemorrhage    Family History  Problem Relation Age of Onset  . Arthritis Mother   . Stroke Brother        previous smoker  . Alcohol  abuse Brother        in remission  . Leukemia Brother   . Diabetes Paternal Grandmother   . Healthy Son     Past Surgical History:  Procedure Laterality Date  . AMPUTATION Right 12/13/2018   Procedure: AMPUTATION RIGHT GREAT TOE, LOCAL RELOCATION OF TISSUE FOR WOUND CLOSURE 9cm x 3cm, VAC APPLICATION;  Surgeon: [name], MD;  Location: [place];  Service: Orthopedics;  Laterality: Right;  . AMPUTATION Right 12/31/2018   Procedure: RIGHT BELOW KNEE AMPUTATION;  Surgeon: [name], MD;  Location: [facility];  Service: Orthopedics;  Laterality: Right;  . AMPUTATION TOE Left 09/09/2016   Procedure: AMPUTATION OF LEFT GREAT TOE;  Surgeon: Milly Jakob, MD;  Location: [facility];  Service: Orthopedics;  Laterality: Left;  . CARDIAC CATHETERIZATION  06/24/2018  . CORONARY ARTERY BYPASS GRAFT N/A 06/28/2018   Procedure: CORONARY ARTERY BYPASS GRAFTING (CABG) times  four, using left internal  mammary artery, endoscopically harvested right saphenous vein, and harvested left radial artery;  Surgeon: [name], MD;  Location: [facility];  Service: Open Heart Surgery;  Laterality: N/A;  . ENDOVEIN HARVEST OF GREATER SAPHENOUS VEIN Right 06/28/2018   Procedure: ENDOVEIN HARVEST OF GREATER SAPHENOUS VEIN;  Surgeon: [name], MD;  Location: [facility];  Service: Open Heart Surgery;  Laterality: Right;  . LEFT HEART CATH AND CORONARY ANGIOGRAPHY N/A 06/24/2018   Procedure: LEFT HEART CATH AND CORONARY ANGIOGRAPHY;  Surgeon: Troy Sine, MD;  Location: [place] CV LAB;  Service: Cardiovascular;  Laterality: N/A;  . RADIAL ARTERY HARVEST Left 06/28/2018   Procedure: RADIAL ARTERY HARVEST;  Surgeon: [name], MD;  Location: [place];  Service: Open Heart Surgery;  Laterality: Left;  . TEE WITHOUT CARDIOVERSION N/A 06/28/2018   Procedure: TRANSESOPHAGEAL ECHOCARDIOGRAM (TEE);  Surgeon: [name], MD;  Location: [place];  Service: Open Heart Surgery;   Laterality: N/A;  . WISDOM TOOTH EXTRACTION     Social History   Occupational History  . Occupation: Glass blower/designer  Tobacco Use  . Smoking status: Former Smoker    Packs/day: 0.50    Last attempt to quit: 09/08/2005    Years since quitting: 13.3  . Smokeless tobacco: Never Used  Substance and Sexual Activity  . Alcohol use: No    Alcohol/week: 0.0 standard drinks  . Drug use: Not Currently  . Sexual activity: Yes    Partners: Male    Birth control/protection: None

## 2019-01-28 ENCOUNTER — Encounter: Payer: Self-pay | Admitting: Registered Nurse

## 2019-01-28 ENCOUNTER — Other Ambulatory Visit: Payer: Self-pay

## 2019-01-28 ENCOUNTER — Encounter: Payer: BLUE CROSS/BLUE SHIELD | Attending: Registered Nurse | Admitting: Registered Nurse

## 2019-01-28 VITALS — Ht 63.0 in | Wt 147.0 lb

## 2019-01-28 DIAGNOSIS — L89890 Pressure ulcer of other site, unstageable: Secondary | ICD-10-CM | POA: Diagnosis not present

## 2019-01-28 DIAGNOSIS — Z4781 Encounter for orthopedic aftercare following surgical amputation: Secondary | ICD-10-CM | POA: Diagnosis not present

## 2019-01-28 DIAGNOSIS — T8743 Infection of amputation stump, right lower extremity: Secondary | ICD-10-CM | POA: Diagnosis not present

## 2019-01-28 DIAGNOSIS — E039 Hypothyroidism, unspecified: Secondary | ICD-10-CM

## 2019-01-28 DIAGNOSIS — G546 Phantom limb syndrome with pain: Secondary | ICD-10-CM | POA: Diagnosis not present

## 2019-01-28 DIAGNOSIS — I10 Essential (primary) hypertension: Secondary | ICD-10-CM | POA: Diagnosis not present

## 2019-01-28 DIAGNOSIS — E119 Type 2 diabetes mellitus without complications: Secondary | ICD-10-CM | POA: Diagnosis not present

## 2019-01-28 DIAGNOSIS — N183 Chronic kidney disease, stage 3 (moderate): Secondary | ICD-10-CM | POA: Diagnosis not present

## 2019-01-28 DIAGNOSIS — I130 Hypertensive heart and chronic kidney disease with heart failure and stage 1 through stage 4 chronic kidney disease, or unspecified chronic kidney disease: Secondary | ICD-10-CM | POA: Diagnosis not present

## 2019-01-28 DIAGNOSIS — I872 Venous insufficiency (chronic) (peripheral): Secondary | ICD-10-CM | POA: Diagnosis not present

## 2019-01-28 DIAGNOSIS — I5032 Chronic diastolic (congestive) heart failure: Secondary | ICD-10-CM | POA: Diagnosis not present

## 2019-01-28 DIAGNOSIS — Z89511 Acquired absence of right leg below knee: Secondary | ICD-10-CM

## 2019-01-28 DIAGNOSIS — E1122 Type 2 diabetes mellitus with diabetic chronic kidney disease: Secondary | ICD-10-CM | POA: Diagnosis not present

## 2019-01-28 DIAGNOSIS — E1151 Type 2 diabetes mellitus with diabetic peripheral angiopathy without gangrene: Secondary | ICD-10-CM | POA: Diagnosis not present

## 2019-01-28 DIAGNOSIS — I2510 Atherosclerotic heart disease of native coronary artery without angina pectoris: Secondary | ICD-10-CM | POA: Diagnosis not present

## 2019-01-28 DIAGNOSIS — E785 Hyperlipidemia, unspecified: Secondary | ICD-10-CM | POA: Diagnosis not present

## 2019-01-28 NOTE — Progress Notes (Signed)
Subjective:    [name], female    DOB: [birth year], 53 y.o.   MRN: [id number]  HPI: [name] is a 53 y.o. female her appointment was changed, due to national recommendations of social distancing due to [place] 19, an audio/video telehealth visit is felt to be most appropriate for this patient at this time.  See Chart message from today for the patient's consent to telehealth from [place].    [name] was called for a tele-health visit in [place] of her Right BKA, HTN, Type 2 DM in non-obese and hypothyroidism.    [name] was admitted to [facility] on 12/31/2018 with right foot gangrene.She's two week post a right first ray amputation. She presented with increased gangrenous changes and wound dehiscence as well as painful cellulitis of her right foot. She is being admitted for right below knee amputation. She underwent Right BKA by Dr. [name] Given on 12/31/2018.   She was admitted to inpatient rehabilitation on 01/05/2019 and discharged on 01/15/2019. She was discharged home receving home health therapy with [facility].   [name] states every morning she has nausea, vomiting and diarrhea, she spoke with her PCP she was prescribed Zofran. We will continue to monitor.   [name] denies any pain. She rated her pain 0. Also reports she has a good appetite.   [name] CMA asked the Health and History questions. This provider and [name] verified we were  speaking with the correct person using two identifiers.    Pain Inventory Average Pain 0 Pain Right Now 0 My pain is no pain  In the last 24 hours, has pain interfered with the following? General activity no pain Relation with others no pain Enjoyment of life no pain What TIME of day is your pain at its worst? no pain Sleep (in general) Fair  Pain is worse with: no pain Pain improves with: no pain Relief from Meds: no pain  Mobility use  a wheelchair  Function disabled: date disabled .  Neuro/Psych trouble walking  Prior Studies Any changes since last visit?  no  Physicians involved in your care Any changes since last visit?  no   Family History  Problem Relation Age of Onset  . Arthritis Mother   . Stroke Brother        previous smoker  . Alcohol abuse Brother        in remission  . Leukemia Brother   . Diabetes Paternal Grandmother   . Healthy Son    Social History   Socioeconomic History  . Marital status: Single    Spouse name: Not on file  . Number of children: Not on file  . Years of education: Not on file  . Highest education level: Not on file  Occupational History  . Occupation: Glass blower/designer  Social Needs  . Financial resource strain: Not on file  . Food insecurity:    Worry: Not on file    Inability: Not on file  . Transportation needs:    Medical: Not on file    Non-medical: Not on file  Tobacco Use  . Smoking status: Former Smoker    Packs/day: 0.50    Last attempt to quit: 09/08/2005    Years since quitting: 13.3  . Smokeless tobacco: Never Used  Substance and Sexual Activity  . Alcohol use: No    Alcohol/week: 0.0 standard drinks  . Drug use: Not Currently  .  Sexual activity: Yes    Partners: Male    Birth control/protection: None  Lifestyle  . Physical activity:    Days per week: Not on file    Minutes per session: Not on file  . Stress: Not on file  Relationships  . Social connections:    Talks on phone: Not on file    Gets together: Not on file    Attends religious service: Not on file    Active member of club or organization: Not on file    Attends meetings of clubs or organizations: Not on file    Relationship status: Not on file  Other Topics Concern  . Not on file  Social History Narrative   Lives with husband in a two story home.  Has one child.     Works as a Glass blower/designer.     Education: high school.   Past Surgical History:  Procedure  Laterality Date  . AMPUTATION Right 12/13/2018   Procedure: AMPUTATION RIGHT GREAT TOE, LOCAL RELOCATION OF TISSUE FOR WOUND CLOSURE 9cm x 3cm, VAC APPLICATION;  Surgeon: [name], MD;  Location: [facility];  Service: Orthopedics;  Laterality: Right;  . AMPUTATION Right 12/31/2018   Procedure: RIGHT BELOW KNEE AMPUTATION;  Surgeon: [name], MD;  Location: [place] [facility];  Service: Orthopedics;  Laterality: Right;  . AMPUTATION TOE Left 09/09/2016   Procedure: AMPUTATION OF LEFT GREAT TOE;  Surgeon: Milly Jakob, MD;  Location: [facility];  Service: Orthopedics;  Laterality: Left;  . CARDIAC CATHETERIZATION  06/24/2018  . CORONARY ARTERY BYPASS GRAFT N/A 06/28/2018   Procedure: CORONARY ARTERY BYPASS GRAFTING (CABG) times  four, using left internal mammary artery, endoscopically harvested right saphenous vein, and harvested left radial artery;  Surgeon: [name], MD;  Location: [place];  Service: Open Heart Surgery;  Laterality: N/A;  . ENDOVEIN HARVEST OF GREATER SAPHENOUS VEIN Right 06/28/2018   Procedure: ENDOVEIN HARVEST OF GREATER SAPHENOUS VEIN;  Surgeon: [name], MD;  Location: [facility];  Service: Open Heart Surgery;  Laterality: Right;  . LEFT HEART CATH AND CORONARY ANGIOGRAPHY N/A 06/24/2018   Procedure: LEFT HEART CATH AND CORONARY ANGIOGRAPHY;  Surgeon: Troy Sine, MD;  Location: [facility] CV [facility];  Service: Cardiovascular;  Laterality: N/A;  . RADIAL ARTERY HARVEST Left 06/28/2018   Procedure: RADIAL ARTERY HARVEST;  Surgeon: [name], MD;  Location: [place];  Service: Open Heart Surgery;  Laterality: Left;  . TEE WITHOUT CARDIOVERSION N/A 06/28/2018   Procedure: TRANSESOPHAGEAL ECHOCARDIOGRAM (TEE);  Surgeon: [name], MD;  Location: [facility];  Service: Open Heart Surgery;  Laterality: N/A;  . WISDOM TOOTH EXTRACTION     Past Medical History:  Diagnosis Date  . Abscess of great toe, right   . Acute on chronic diastolic  CHF (congestive heart failure) ([place]) 06/24/2018  . Acute osteomyelitis of toe, left ([place]) 09/05/2016  . Amputation of right great toe ([place]) 12/22/2018  . Anxiety   . Cellulitis of foot, right 12/11/2018  . COMMON MIGRAINE 10/07/2010  . Decreased visual acuity 11/10/2016  . Depression 12/18/2012  . Diabetes mellitus type II, uncontrolled ([place]) 10/07/2010   Qualifier: Diagnosis of  By: Charlett Blake MD, [name]    . Diabetic foot infection ([place]) 08/26/2016  . Disturbance of skin sensation 10/07/2010  . Gangrene of right foot ([place])   . Heart murmur   . History of kidney stones    "years ago"  . Hyperlipidemia 12/06/2010  . Hypertension   .  Lipoma of abdominal wall 10/05/2016  . Overweight(278.02) 12/06/2010  . PERIPHERAL NEUROPATHY, FEET 10/07/2010  . PVD (peripheral vascular disease) ([place]) 01/21/2012  . RESTLESS LEG SYNDROME 10/25/2010  . Stroke [facility]) 2014, 2017   most recently in 2/17 - intracerebral hemorrhage   LMP 02/12/2011 (Exact Date)   Opioid Risk Score:   Fall Risk Score:  `1  Depression screen PHQ 2/9  Depression screen [facility] 2/9 09/17/2018 07/19/2018 08/26/2016 03/03/2016 05/21/2015  Decreased Interest 0 0 0 0 0  Down, Depressed, Hopeless 0 0 0 0 0  PHQ - 2 Score 0 0 0 0 0  Some recent data might be hidden    Review of Systems  Constitutional: Negative.   HENT: Negative.   Eyes: Negative.   Respiratory: Negative.   Gastrointestinal: Positive for nausea and vomiting.  Endocrine: Negative.   Genitourinary: Negative.   Musculoskeletal: Positive for gait problem.  Skin: Positive for wound.  Allergic/Immunologic: Negative.   Hematological: Negative.   Psychiatric/Behavioral: Negative.        Objective:   Physical Exam Vitals signs and nursing note reviewed.  Musculoskeletal:     Comments: No Physical Exam performed: Virtual Visit  Neurological:     Mental Status: She is oriented to person, place, and time.           Assessment & Plan:  1. Right BKA: Continue with Home  Health Therapy and Dr. [name] Given Following.  2. Essential Hypertension: Continue current medication regimen. PCP following.  3. Type 2 DM in Non-obese: Continue current medication regimen. PCP Following.  4. Hypothyroidism: Continue current medication regimen. PCP Following.  Telephone Call  Location of patient: In her Home Location of provider: Office Established patient Time spent on call: 10 minutes

## 2019-02-01 ENCOUNTER — Telehealth: Payer: Self-pay | Admitting: Family Medicine

## 2019-02-01 DIAGNOSIS — E1151 Type 2 diabetes mellitus with diabetic peripheral angiopathy without gangrene: Secondary | ICD-10-CM | POA: Diagnosis not present

## 2019-02-01 DIAGNOSIS — I130 Hypertensive heart and chronic kidney disease with heart failure and stage 1 through stage 4 chronic kidney disease, or unspecified chronic kidney disease: Secondary | ICD-10-CM | POA: Diagnosis not present

## 2019-02-01 DIAGNOSIS — G546 Phantom limb syndrome with pain: Secondary | ICD-10-CM | POA: Diagnosis not present

## 2019-02-01 DIAGNOSIS — I5032 Chronic diastolic (congestive) heart failure: Secondary | ICD-10-CM | POA: Diagnosis not present

## 2019-02-01 DIAGNOSIS — E1122 Type 2 diabetes mellitus with diabetic chronic kidney disease: Secondary | ICD-10-CM | POA: Diagnosis not present

## 2019-02-01 DIAGNOSIS — T8743 Infection of amputation stump, right lower extremity: Secondary | ICD-10-CM | POA: Diagnosis not present

## 2019-02-01 DIAGNOSIS — I872 Venous insufficiency (chronic) (peripheral): Secondary | ICD-10-CM | POA: Diagnosis not present

## 2019-02-01 DIAGNOSIS — Z4781 Encounter for orthopedic aftercare following surgical amputation: Secondary | ICD-10-CM | POA: Diagnosis not present

## 2019-02-01 DIAGNOSIS — N183 Chronic kidney disease, stage 3 (moderate): Secondary | ICD-10-CM | POA: Diagnosis not present

## 2019-02-01 DIAGNOSIS — L89890 Pressure ulcer of other site, unstageable: Secondary | ICD-10-CM | POA: Diagnosis not present

## 2019-02-01 DIAGNOSIS — E785 Hyperlipidemia, unspecified: Secondary | ICD-10-CM | POA: Diagnosis not present

## 2019-02-01 DIAGNOSIS — Z89511 Acquired absence of right leg below knee: Secondary | ICD-10-CM | POA: Diagnosis not present

## 2019-02-01 DIAGNOSIS — I2510 Atherosclerotic heart disease of native coronary artery without angina pectoris: Secondary | ICD-10-CM | POA: Diagnosis not present

## 2019-02-01 NOTE — Telephone Encounter (Signed)
Copied from [place] [id number]. Topic: Quick Communication - See Telephone Encounter >> Feb 01, 2019 12:33 PM Valla Leaver wrote: CRM for notification. See Telephone encounter for: 02/01/19. Beth with [facility] calling to notify bp running high w/ medication. 162/100

## 2019-02-01 NOTE — Telephone Encounter (Signed)
Please advise 

## 2019-02-02 ENCOUNTER — Telehealth: Payer: Self-pay

## 2019-02-02 ENCOUNTER — Other Ambulatory Visit: Payer: Self-pay

## 2019-02-02 ENCOUNTER — Ambulatory Visit (INDEPENDENT_AMBULATORY_CARE_PROVIDER_SITE_OTHER): Payer: BLUE CROSS/BLUE SHIELD | Admitting: Family

## 2019-02-02 ENCOUNTER — Encounter: Payer: Self-pay | Admitting: Family

## 2019-02-02 VITALS — Ht 63.0 in | Wt 147.0 lb

## 2019-02-02 DIAGNOSIS — Z89511 Acquired absence of right leg below knee: Secondary | ICD-10-CM

## 2019-02-02 DIAGNOSIS — T8743 Infection of amputation stump, right lower extremity: Secondary | ICD-10-CM | POA: Diagnosis not present

## 2019-02-02 DIAGNOSIS — S81802D Unspecified open wound, left lower leg, subsequent encounter: Secondary | ICD-10-CM

## 2019-02-02 DIAGNOSIS — L89622 Pressure ulcer of left heel, stage 2: Secondary | ICD-10-CM

## 2019-02-02 MED ORDER — SILVER SULFADIAZINE 1 % EX CREA: 1.0000 "application " | TOPICAL_CREAM | Freq: Every day | CUTANEOUS | 0 refills | Status: DC

## 2019-02-02 NOTE — Telephone Encounter (Signed)
Patient would like a call back concerning wrapping her LE.  CB# is [phone].  Please advise.  Thank you.

## 2019-02-02 NOTE — Telephone Encounter (Signed)
[name] spoke with pt and she wanted to know if she needed to put shrinker on top of dressing advised that yes she should. Will call back with additional questions.

## 2019-02-02 NOTE — Telephone Encounter (Signed)
I called and sw HHN with [facility] advised that we saw the pt in the office today and wanted to have HHN apply thin layer of silvadene to BKA apply ac and shrinker the also apply thin layer of silvadene to left heel with Kerlix and ace from toes to the knee and wear PRAFO.  rx for silvadene was faxed to the pharmacy for pt. To call with questions.

## 2019-02-02 NOTE — Telephone Encounter (Signed)
I need to know her pulse also and then as long as it is greater than 65 can increase the Metoprolol to 50 mg po bid and recheck BP and pulse and report it to [name] early next week.

## 2019-02-02 NOTE — Progress Notes (Signed)
Post-Op Visit Note   Patient: [name]           Date of Birth: Dec 17, 1965           MRN: [id number] Visit Date: 02/02/2019 PCP: Mosie Lukes, MD  Chief Complaint:  Chief Complaint  Patient presents with  . Right Leg - Routine Post Op    12/31/18 right BKA  . Left Foot - Follow-up    Heel ulcer     HPI:  HPI The patient is a 53 year old woman who presents today for postoperative follow-up.  She is status post right below the knee amputation on April 24.  She was given a shrinker and limb protector on discharge.      [facility] with her nursing needs.  She also has a decubitus to her left heel this has been treated with foam dressings and a PRAFO boot.  She has been advised to limit her weightbearing on the left.  Has been doing dry dressing changes to surgical site and foam dressings to heel  Ortho Exam On examination of the right lower extremity the incision is well approximated with some ischemic changes along incision. There is eschar. No drainage. No maceration. No erythema.  Good consolidation of the limb overall.  No odor.  On examination of the left heel there is a 3 cm in diameter ulcer to the posterior heel, stage 2, wound with granulation, is not open plantarly.  scant serosanguinous drainage. No purulence. No surrounding erythema or warmth.   Visit Diagnoses:  1. Right below-knee amputee ([place])   2. Wound of left lower extremity, subsequent encounter   3. Pressure injury of left heel, stage 2 (HCC)     Plan: daily dial soap cleansing to incision and left heel daily. Will do silvadene dressing changes to right incision and left heel daily. Continue prafo when at rest. Up with PT as tolerated. Provided order to hanger for prosthesis set up.  Follow-Up Instructions: No follow-ups on file.   Imaging: No results found.  Orders:  No orders of the defined types were placed in this encounter.  No orders of the defined types were placed in  this encounter.    PMFS History: Patient Active Problem List   Diagnosis Date Noted  . Hypoglycemia   . Diabetes mellitus type 2 in nonobese (HCC)   . Labile blood pressure   . Labile blood glucose   . Phantom limb pain ([place])   . Right below-knee amputee ([place]) 01/05/2019  . CKD (chronic kidney disease), stage III ([place])   . Hypothyroidism   . Essential hypertension   . Chronic diastolic congestive heart failure ([place])   . Coronary artery disease involving coronary bypass graft of native heart without angina pectoris   . Poorly controlled type 2 diabetes mellitus with peripheral neuropathy ([place])   . Acute on chronic anemia   . History of below knee amputation, right ([place]) 12/31/2018  . S/P CABG x 3 06/28/2018  . Coronary artery disease involving native coronary artery of native heart with angina pectoris ([place]) 06/25/2018  . Type 2 diabetes mellitus with vascular disease ([place]) 06/24/2018  . Acute on chronic diastolic CHF (congestive heart failure) ([place]) 06/24/2018  . Non-ST elevation (NSTEMI) myocardial infarction ([place]) 06/24/2018  . Chest pain 06/23/2018  . Glaucoma 09/17/2017  . Diabetic retinopathy ([place]) 09/17/2017  . Otitis media 11/10/2016  . Decreased visual acuity 11/10/2016  . Spinal stenosis, lumbar region with neurogenic claudication 10/22/2016  .  Other spondylosis with radiculopathy, lumbar region 10/13/2016  . Lipoma of abdominal wall 10/05/2016  . Severe protein-calorie malnutrition ([place]) 09/04/2016  . Anemia of chronic disease 09/04/2016  . Diabetic polyneuropathy associated with type 2 diabetes mellitus ([place]) 05/07/2016  . Orthostatic hypotension 05/07/2016  . Cerebrovascular disease 04/27/2016  . N&V (nausea and vomiting) 04/01/2016  . ICH (intracerebral hemorrhage) ([place]) 03/30/2016  . Visit for preventive health examination 05/21/2015  . Breast cancer screening 05/21/2015  . Arm lesion 07/02/2014  . Pain in joint, ankle and foot 04/28/2014  . Pain in limb  01/13/2014  . Neuropathic pain of both legs 01/13/2014  . Hip pain 01/12/2014  . Allergic rhinitis 03/10/2013  . Back pain 03/08/2013  . Depression 12/18/2012  . PVD (peripheral vascular disease) ([place]) 01/21/2012  . Wound of left leg 12/22/2011  . Hyperlipidemia 12/06/2010  . Overweight(278.02) 12/06/2010  . RESTLESS LEG SYNDROME 10/25/2010  . Migraine without aura 10/07/2010  . Hereditary and idiopathic peripheral neuropathy 10/07/2010  . Essential hypertension, benign 10/07/2010  . DISTURBANCE OF SKIN SENSATION 10/07/2010  . HEART MURMUR, HX OF 10/07/2010  . NEPHROLITHIASIS, HX OF 10/07/2010   Past Medical History:  Diagnosis Date  . Abscess of great toe, right   . Acute on chronic diastolic CHF (congestive heart failure) ([place]) 06/24/2018  . Acute osteomyelitis of toe, left ([place]) 09/05/2016  . Amputation of right great toe ([place]) 12/22/2018  . Anxiety   . Cellulitis of foot, right 12/11/2018  . COMMON MIGRAINE 10/07/2010  . Decreased visual acuity 11/10/2016  . Depression 12/18/2012  . Diabetes mellitus type II, uncontrolled ([place]) 10/07/2010   Qualifier: Diagnosis of  By: Charlett Blake MD, [name]    . Diabetic foot infection ([place]) 08/26/2016  . Diabetic infection of right foot ([place])   . Disturbance of skin sensation 10/07/2010  . Gangrene of right foot ([place])   . Heart murmur   . History of kidney stones    "years ago"  . Hyperlipidemia 12/06/2010  . Hypertension   . Lipoma of abdominal wall 10/05/2016  . Overweight(278.02) 12/06/2010  . PERIPHERAL NEUROPATHY, FEET 10/07/2010  . PVD (peripheral vascular disease) ([place]) 01/21/2012  . RESTLESS LEG SYNDROME 10/25/2010  . Stroke [facility]) 2014, 2017   most recently in 2/17 - intracerebral hemorrhage    Family History  Problem Relation Age of Onset  . Arthritis Mother   . Stroke Brother        previous smoker  . Alcohol abuse Brother        in remission  . Leukemia Brother   . Diabetes Paternal Grandmother   . Healthy Son     Past Surgical  History:  Procedure Laterality Date  . AMPUTATION Right 12/13/2018   Procedure: AMPUTATION RIGHT GREAT TOE, LOCAL RELOCATION OF TISSUE FOR WOUND CLOSURE 9cm x 3cm, VAC APPLICATION;  Surgeon: [name], MD;  Location: [place];  Service: Orthopedics;  Laterality: Right;  . AMPUTATION Right 12/31/2018   Procedure: RIGHT BELOW KNEE AMPUTATION;  Surgeon: [name], MD;  Location: [place];  Service: Orthopedics;  Laterality: Right;  . AMPUTATION TOE Left 09/09/2016   Procedure: AMPUTATION OF LEFT GREAT TOE;  Surgeon: Milly Jakob, MD;  Location: [place];  Service: Orthopedics;  Laterality: Left;  . CARDIAC CATHETERIZATION  06/24/2018  . CORONARY ARTERY BYPASS GRAFT N/A 06/28/2018   Procedure: CORONARY ARTERY BYPASS GRAFTING (CABG) times  four, using left internal mammary artery, endoscopically harvested right saphenous vein, and harvested left radial artery;  Surgeon:  [name], MD;  Location: [place];  Service: Open Heart Surgery;  Laterality: N/A;  . ENDOVEIN HARVEST OF GREATER SAPHENOUS VEIN Right 06/28/2018   Procedure: ENDOVEIN HARVEST OF GREATER SAPHENOUS VEIN;  Surgeon: [name], MD;  Location: [place];  Service: Open Heart Surgery;  Laterality: Right;  . LEFT HEART CATH AND CORONARY ANGIOGRAPHY N/A 06/24/2018   Procedure: LEFT HEART CATH AND CORONARY ANGIOGRAPHY;  Surgeon: Troy Sine, MD;  Location: [place] CV LAB;  Service: Cardiovascular;  Laterality: N/A;  . RADIAL ARTERY HARVEST Left 06/28/2018   Procedure: RADIAL ARTERY HARVEST;  Surgeon: [name], MD;  Location: [place];  Service: Open Heart Surgery;  Laterality: Left;  . TEE WITHOUT CARDIOVERSION N/A 06/28/2018   Procedure: TRANSESOPHAGEAL ECHOCARDIOGRAM (TEE);  Surgeon: [name], MD;  Location: [place];  Service: Open Heart Surgery;  Laterality: N/A;  . WISDOM TOOTH EXTRACTION     Social History   Occupational History  . Occupation: Glass blower/designer  Tobacco  Use  . Smoking status: Former Smoker    Packs/day: 0.50    Last attempt to quit: 09/08/2005    Years since quitting: 13.4  . Smokeless tobacco: Never Used  Substance and Sexual Activity  . Alcohol use: No    Alcohol/week: 0.0 standard drinks  . Drug use: Not Currently  . Sexual activity: Yes    Partners: Male    Birth control/protection: None

## 2019-02-03 DIAGNOSIS — I2510 Atherosclerotic heart disease of native coronary artery without angina pectoris: Secondary | ICD-10-CM | POA: Diagnosis not present

## 2019-02-03 DIAGNOSIS — Z4781 Encounter for orthopedic aftercare following surgical amputation: Secondary | ICD-10-CM | POA: Diagnosis not present

## 2019-02-03 DIAGNOSIS — L89890 Pressure ulcer of other site, unstageable: Secondary | ICD-10-CM | POA: Diagnosis not present

## 2019-02-03 DIAGNOSIS — I872 Venous insufficiency (chronic) (peripheral): Secondary | ICD-10-CM | POA: Diagnosis not present

## 2019-02-03 DIAGNOSIS — I130 Hypertensive heart and chronic kidney disease with heart failure and stage 1 through stage 4 chronic kidney disease, or unspecified chronic kidney disease: Secondary | ICD-10-CM | POA: Diagnosis not present

## 2019-02-03 DIAGNOSIS — Z89511 Acquired absence of right leg below knee: Secondary | ICD-10-CM | POA: Diagnosis not present

## 2019-02-03 DIAGNOSIS — G546 Phantom limb syndrome with pain: Secondary | ICD-10-CM | POA: Diagnosis not present

## 2019-02-03 DIAGNOSIS — I5032 Chronic diastolic (congestive) heart failure: Secondary | ICD-10-CM | POA: Diagnosis not present

## 2019-02-03 DIAGNOSIS — N183 Chronic kidney disease, stage 3 (moderate): Secondary | ICD-10-CM | POA: Diagnosis not present

## 2019-02-03 DIAGNOSIS — E785 Hyperlipidemia, unspecified: Secondary | ICD-10-CM | POA: Diagnosis not present

## 2019-02-03 DIAGNOSIS — T8743 Infection of amputation stump, right lower extremity: Secondary | ICD-10-CM | POA: Diagnosis not present

## 2019-02-03 DIAGNOSIS — E1122 Type 2 diabetes mellitus with diabetic chronic kidney disease: Secondary | ICD-10-CM | POA: Diagnosis not present

## 2019-02-03 DIAGNOSIS — E1151 Type 2 diabetes mellitus with diabetic peripheral angiopathy without gangrene: Secondary | ICD-10-CM | POA: Diagnosis not present

## 2019-02-03 NOTE — Telephone Encounter (Signed)
Called left message for nurse to give me a call back or patient to give me a call back

## 2019-02-04 NOTE — Telephone Encounter (Signed)
So instruct her to increase her Metoprolol to 50 mg po bid and record BP and pulse. Then we can have a virtual visit in about a week to discuss further options

## 2019-02-04 NOTE — Telephone Encounter (Signed)
Spoke with [name] she stated the patients blood pressure has been running 140-160 on the top and 80-100's on the bottom. Pulse has been 80-102 All of these numbers have been reported after she has taken her medication

## 2019-02-05 ENCOUNTER — Telehealth: Payer: Self-pay

## 2019-02-05 ENCOUNTER — Telehealth: Payer: Self-pay | Admitting: Internal Medicine

## 2019-02-05 NOTE — Telephone Encounter (Signed)
Left message that the patient has had an exposure to Covid-19.  Left message recommending the patient call [phone] to schedule an appointment for free Covid-19 testing at her earliest convenience.

## 2019-02-05 NOTE — Telephone Encounter (Signed)
Pt declines testing. PEC number provided and states that she will call back for appt if she changes her mind. Currently asymptomatic.

## 2019-02-08 DIAGNOSIS — L89890 Pressure ulcer of other site, unstageable: Secondary | ICD-10-CM

## 2019-02-08 DIAGNOSIS — G546 Phantom limb syndrome with pain: Secondary | ICD-10-CM

## 2019-02-08 DIAGNOSIS — I872 Venous insufficiency (chronic) (peripheral): Secondary | ICD-10-CM

## 2019-02-08 DIAGNOSIS — E1151 Type 2 diabetes mellitus with diabetic peripheral angiopathy without gangrene: Secondary | ICD-10-CM

## 2019-02-08 DIAGNOSIS — I130 Hypertensive heart and chronic kidney disease with heart failure and stage 1 through stage 4 chronic kidney disease, or unspecified chronic kidney disease: Secondary | ICD-10-CM

## 2019-02-08 DIAGNOSIS — Z89511 Acquired absence of right leg below knee: Secondary | ICD-10-CM

## 2019-02-08 DIAGNOSIS — T8743 Infection of amputation stump, right lower extremity: Secondary | ICD-10-CM

## 2019-02-08 DIAGNOSIS — I2510 Atherosclerotic heart disease of native coronary artery without angina pectoris: Secondary | ICD-10-CM

## 2019-02-08 DIAGNOSIS — Z4781 Encounter for orthopedic aftercare following surgical amputation: Secondary | ICD-10-CM

## 2019-02-09 ENCOUNTER — Other Ambulatory Visit: Payer: Self-pay

## 2019-02-09 ENCOUNTER — Ambulatory Visit (INDEPENDENT_AMBULATORY_CARE_PROVIDER_SITE_OTHER): Payer: BLUE CROSS/BLUE SHIELD | Admitting: Family

## 2019-02-09 ENCOUNTER — Encounter: Payer: Self-pay | Admitting: Family

## 2019-02-09 ENCOUNTER — Other Ambulatory Visit: Payer: Self-pay | Admitting: Physician Assistant

## 2019-02-09 VITALS — Ht 63.0 in | Wt 147.0 lb

## 2019-02-09 DIAGNOSIS — Z89511 Acquired absence of right leg below knee: Secondary | ICD-10-CM

## 2019-02-09 DIAGNOSIS — L89620 Pressure ulcer of left heel, unstageable: Secondary | ICD-10-CM

## 2019-02-09 NOTE — Telephone Encounter (Signed)
Spoke with [facility]

## 2019-02-09 NOTE — Progress Notes (Signed)
Post-Op Visit Note   Patient: [name]           Date of Birth: [birth year]           MRN: [id number] Visit Date: 02/09/2019 PCP: Mosie Lukes, MD  Chief Complaint:  Chief Complaint  Patient presents with  . Right Leg - Routine Post Op    12/31/18 right BKA   . Left Foot - Follow-up    Heel ulcer     HPI:  HPI The patient is a 53 year old woman who presents today status post right below the knee amputation April 24 of this year.  She has been doing Silvadene dressing changes at home and wearing her shrinker over her Ace wrap.  She also has a decubitus ulcer to her left heel.  Has been wearing a PRAFO around-the-clock.  Ortho Exam On examination of the right lower extremity the residual limb is consolidating well however there is eschar along the entire incision.  This is 2 cm at its widest part.  There is some fibrinous yellow tissue on the edges.  There is scant serosanguineous drainage.  Some tenderness to the medial aspect.  There is no surrounding erythema no odor no purulence no sign of infection.  The left heel there is a decubitus ulcer this is 2 cm in diameter covered with eschar.  There is no drainage no surrounding erythema no odor mild tenderness  Visit Diagnoses:  1. History of below knee amputation, right ([place])   2. Pressure injury of left heel, unstageable ([place])     Plan: Provided her with some Iodosorb for dressing changes.  Iodosorb dressings applied to the heel and the residual limb today.  She will continue with daily wound cleansing Iodosorb dressing changes daily.  When the Iodosorb runs out she will transition back to Silvadene dressings.  Encouraged her to wear her shrinker daily.  She will follow-up in the office in 2 weeks.  Discussed she may begin weightbearing on the left with physical therapy.  Follow-Up Instructions: Return in about 2 weeks (around 02/23/2019).   Imaging: No results found.  Orders:  No orders of the defined types were placed  in this encounter.  No orders of the defined types were placed in this encounter.    PMFS History: Patient Active Problem List   Diagnosis Date Noted  . Pressure injury of left heel, unstageable ([place]) 02/09/2019  . Hypoglycemia   . Diabetes mellitus type 2 in nonobese (HCC)   . Labile blood pressure   . Labile blood glucose   . Phantom limb pain ([place])   . Right below-knee amputee ([place]) 01/05/2019  . CKD (chronic kidney disease), stage III ([place])   . Hypothyroidism   . Essential hypertension   . Chronic diastolic congestive heart failure ([place])   . Coronary artery disease involving coronary bypass graft of native heart without angina pectoris   . Poorly controlled type 2 diabetes mellitus with peripheral neuropathy ([place])   . Acute on chronic anemia   . History of below knee amputation, right ([place]) 12/31/2018  . S/P CABG x 3 06/28/2018  . Coronary artery disease involving native coronary artery of native heart with angina pectoris ([place]) 06/25/2018  . Type 2 diabetes mellitus with vascular disease ([place]) 06/24/2018  . Acute on chronic diastolic CHF (congestive heart failure) ([place]) 06/24/2018  . Non-ST elevation (NSTEMI) myocardial infarction ([place]) 06/24/2018  . Chest pain 06/23/2018  . Glaucoma 09/17/2017  . Diabetic retinopathy ([place]) 09/17/2017  .  Otitis media 11/10/2016  . Decreased visual acuity 11/10/2016  . Spinal stenosis, lumbar region with neurogenic claudication 10/22/2016  . Other spondylosis with radiculopathy, lumbar region 10/13/2016  . Lipoma of abdominal wall 10/05/2016  . Severe protein-calorie malnutrition ([place]) 09/04/2016  . Anemia of chronic disease 09/04/2016  . Diabetic polyneuropathy associated with type 2 diabetes mellitus ([place]) 05/07/2016  . Orthostatic hypotension 05/07/2016  . Cerebrovascular disease 04/27/2016  . N&V (nausea and vomiting) 04/01/2016  . ICH (intracerebral hemorrhage) ([place]) 03/30/2016  . Visit for preventive health examination 05/21/2015   . Breast cancer screening 05/21/2015  . Arm lesion 07/02/2014  . Pain in joint, ankle and foot 04/28/2014  . Pain in limb 01/13/2014  . Neuropathic pain of both legs 01/13/2014  . Hip pain 01/12/2014  . Allergic rhinitis 03/10/2013  . Back pain 03/08/2013  . Depression 12/18/2012  . PVD (peripheral vascular disease) ([place]) 01/21/2012  . Wound of left leg 12/22/2011  . Hyperlipidemia 12/06/2010  . Overweight(278.02) 12/06/2010  . RESTLESS LEG SYNDROME 10/25/2010  . Migraine without aura 10/07/2010  . Hereditary and idiopathic peripheral neuropathy 10/07/2010  . Essential hypertension, benign 10/07/2010  . DISTURBANCE OF SKIN SENSATION 10/07/2010  . HEART MURMUR, HX OF 10/07/2010  . NEPHROLITHIASIS, HX OF 10/07/2010   Past Medical History:  Diagnosis Date  . Abscess of great toe, right   . Acute on chronic diastolic CHF (congestive heart failure) ([place]) 06/24/2018  . Acute osteomyelitis of toe, left ([place]) 09/05/2016  . Amputation of right great toe ([place]) 12/22/2018  . Anxiety   . Cellulitis of foot, right 12/11/2018  . COMMON MIGRAINE 10/07/2010  . Decreased visual acuity 11/10/2016  . Depression 12/18/2012  . Diabetes mellitus type II, uncontrolled ([place]) 10/07/2010   Qualifier: Diagnosis of  By: Charlett Blake MD, [name]    . Diabetic foot infection ([place]) 08/26/2016  . Diabetic infection of right foot ([place])   . Disturbance of skin sensation 10/07/2010  . Gangrene of right foot ([place])   . Heart murmur   . History of kidney stones    "years ago"  . Hyperlipidemia 12/06/2010  . Hypertension   . Lipoma of abdominal wall 10/05/2016  . Overweight(278.02) 12/06/2010  . PERIPHERAL NEUROPATHY, FEET 10/07/2010  . PVD (peripheral vascular disease) ([place]) 01/21/2012  . RESTLESS LEG SYNDROME 10/25/2010  . Stroke [facility]) 2014, 2017   most recently in 2/17 - intracerebral hemorrhage    Family History  Problem Relation Age of Onset  . Arthritis Mother   . Stroke Brother        previous smoker  . Alcohol  abuse Brother        in remission  . Leukemia Brother   . Diabetes Paternal Grandmother   . Healthy Son     Past Surgical History:  Procedure Laterality Date  . AMPUTATION Right 12/13/2018   Procedure: AMPUTATION RIGHT GREAT TOE, LOCAL RELOCATION OF TISSUE FOR WOUND CLOSURE 9cm x 3cm, VAC APPLICATION;  Surgeon: [name], MD;  Location: [place];  Service: Orthopedics;  Laterality: Right;  . AMPUTATION Right 12/31/2018   Procedure: RIGHT BELOW KNEE AMPUTATION;  Surgeon: [name], MD;  Location: [place];  Service: Orthopedics;  Laterality: Right;  . AMPUTATION TOE Left 09/09/2016   Procedure: AMPUTATION OF LEFT GREAT TOE;  Surgeon: Milly Jakob, MD;  Location: [place];  Service: Orthopedics;  Laterality: Left;  . CARDIAC CATHETERIZATION  06/24/2018  . CORONARY ARTERY BYPASS GRAFT N/A 06/28/2018   Procedure: CORONARY ARTERY BYPASS GRAFTING (  CABG) times  four, using left internal mammary artery, endoscopically harvested right saphenous vein, and harvested left radial artery;  Surgeon: [name], MD;  Location: [place];  Service: Open Heart Surgery;  Laterality: N/A;  . ENDOVEIN HARVEST OF GREATER SAPHENOUS VEIN Right 06/28/2018   Procedure: ENDOVEIN HARVEST OF GREATER SAPHENOUS VEIN;  Surgeon: [name], MD;  Location: [place];  Service: Open Heart Surgery;  Laterality: Right;  . LEFT HEART CATH AND CORONARY ANGIOGRAPHY N/A 06/24/2018   Procedure: LEFT HEART CATH AND CORONARY ANGIOGRAPHY;  Surgeon: Troy Sine, MD;  Location: [place] [facility];  Service: Cardiovascular;  Laterality: N/A;  . RADIAL ARTERY HARVEST Left 06/28/2018   Procedure: RADIAL ARTERY HARVEST;  Surgeon: [name], MD;  Location: [place];  Service: Open Heart Surgery;  Laterality: Left;  . TEE WITHOUT CARDIOVERSION N/A 06/28/2018   Procedure: TRANSESOPHAGEAL ECHOCARDIOGRAM (TEE);  Surgeon: [name], MD;  Location: [place];  Service: Open Heart Surgery;   Laterality: N/A;  . WISDOM TOOTH EXTRACTION     Social History   Occupational History  . Occupation: Glass blower/designer  Tobacco Use  . Smoking status: Former Smoker    Packs/day: 0.50    Last attempt to quit: 09/08/2005    Years since quitting: 13.4  . Smokeless tobacco: Never Used  Substance and Sexual Activity  . Alcohol use: No    Alcohol/week: 0.0 standard drinks  . Drug use: Not Currently  . Sexual activity: Yes    Partners: Male    Birth control/protection: None

## 2019-02-09 NOTE — Telephone Encounter (Signed)
Patient notified  And voiced her understanding She will call back next week to schedule appt when she knows when she is available

## 2019-02-10 ENCOUNTER — Telehealth: Payer: Self-pay

## 2019-02-10 DIAGNOSIS — Z4781 Encounter for orthopedic aftercare following surgical amputation: Secondary | ICD-10-CM | POA: Diagnosis not present

## 2019-02-10 DIAGNOSIS — E1151 Type 2 diabetes mellitus with diabetic peripheral angiopathy without gangrene: Secondary | ICD-10-CM | POA: Diagnosis not present

## 2019-02-10 DIAGNOSIS — T8743 Infection of amputation stump, right lower extremity: Secondary | ICD-10-CM | POA: Diagnosis not present

## 2019-02-10 DIAGNOSIS — I130 Hypertensive heart and chronic kidney disease with heart failure and stage 1 through stage 4 chronic kidney disease, or unspecified chronic kidney disease: Secondary | ICD-10-CM | POA: Diagnosis not present

## 2019-02-10 DIAGNOSIS — E785 Hyperlipidemia, unspecified: Secondary | ICD-10-CM | POA: Diagnosis not present

## 2019-02-10 DIAGNOSIS — I872 Venous insufficiency (chronic) (peripheral): Secondary | ICD-10-CM | POA: Diagnosis not present

## 2019-02-10 DIAGNOSIS — G546 Phantom limb syndrome with pain: Secondary | ICD-10-CM | POA: Diagnosis not present

## 2019-02-10 DIAGNOSIS — I2510 Atherosclerotic heart disease of native coronary artery without angina pectoris: Secondary | ICD-10-CM | POA: Diagnosis not present

## 2019-02-10 DIAGNOSIS — Z89511 Acquired absence of right leg below knee: Secondary | ICD-10-CM | POA: Diagnosis not present

## 2019-02-10 DIAGNOSIS — I5032 Chronic diastolic (congestive) heart failure: Secondary | ICD-10-CM | POA: Diagnosis not present

## 2019-02-10 DIAGNOSIS — N183 Chronic kidney disease, stage 3 (moderate): Secondary | ICD-10-CM | POA: Diagnosis not present

## 2019-02-10 DIAGNOSIS — L89890 Pressure ulcer of other site, unstageable: Secondary | ICD-10-CM | POA: Diagnosis not present

## 2019-02-10 DIAGNOSIS — E1122 Type 2 diabetes mellitus with diabetic chronic kidney disease: Secondary | ICD-10-CM | POA: Diagnosis not present

## 2019-02-10 NOTE — Telephone Encounter (Signed)
[name] with [facility] would like verbal orders for wound care and a new order to continue wound care for 3 more weeks.  Cb# is [phone].  Please advise.  Thank you.

## 2019-02-11 NOTE — Telephone Encounter (Signed)
[facility] was called and LVM for okay for VO.

## 2019-02-14 DIAGNOSIS — S88111A Complete traumatic amputation at level between knee and ankle, right lower leg, initial encounter: Secondary | ICD-10-CM | POA: Diagnosis not present

## 2019-02-14 DIAGNOSIS — I5032 Chronic diastolic (congestive) heart failure: Secondary | ICD-10-CM | POA: Diagnosis not present

## 2019-02-16 DIAGNOSIS — I872 Venous insufficiency (chronic) (peripheral): Secondary | ICD-10-CM | POA: Diagnosis not present

## 2019-02-16 DIAGNOSIS — I2510 Atherosclerotic heart disease of native coronary artery without angina pectoris: Secondary | ICD-10-CM | POA: Diagnosis not present

## 2019-02-16 DIAGNOSIS — E1151 Type 2 diabetes mellitus with diabetic peripheral angiopathy without gangrene: Secondary | ICD-10-CM | POA: Diagnosis not present

## 2019-02-16 DIAGNOSIS — T8743 Infection of amputation stump, right lower extremity: Secondary | ICD-10-CM | POA: Diagnosis not present

## 2019-02-16 DIAGNOSIS — E785 Hyperlipidemia, unspecified: Secondary | ICD-10-CM | POA: Diagnosis not present

## 2019-02-16 DIAGNOSIS — I130 Hypertensive heart and chronic kidney disease with heart failure and stage 1 through stage 4 chronic kidney disease, or unspecified chronic kidney disease: Secondary | ICD-10-CM | POA: Diagnosis not present

## 2019-02-16 DIAGNOSIS — G546 Phantom limb syndrome with pain: Secondary | ICD-10-CM | POA: Diagnosis not present

## 2019-02-16 DIAGNOSIS — Z89511 Acquired absence of right leg below knee: Secondary | ICD-10-CM | POA: Diagnosis not present

## 2019-02-16 DIAGNOSIS — L89890 Pressure ulcer of other site, unstageable: Secondary | ICD-10-CM | POA: Diagnosis not present

## 2019-02-16 DIAGNOSIS — N183 Chronic kidney disease, stage 3 (moderate): Secondary | ICD-10-CM | POA: Diagnosis not present

## 2019-02-16 DIAGNOSIS — I5032 Chronic diastolic (congestive) heart failure: Secondary | ICD-10-CM | POA: Diagnosis not present

## 2019-02-16 DIAGNOSIS — E1122 Type 2 diabetes mellitus with diabetic chronic kidney disease: Secondary | ICD-10-CM | POA: Diagnosis not present

## 2019-02-16 DIAGNOSIS — Z4781 Encounter for orthopedic aftercare following surgical amputation: Secondary | ICD-10-CM | POA: Diagnosis not present

## 2019-02-18 ENCOUNTER — Telehealth: Payer: Self-pay | Admitting: *Deleted

## 2019-02-18 NOTE — Telephone Encounter (Signed)
CALLED TO DISCUSS INSTRUCTION FOR 6/18 VIRTUAL VISIT  NO ANSWER.

## 2019-02-22 NOTE — Telephone Encounter (Signed)
   TELEPHONE CALL NOTE  This patient has been deemed a candidate for follow-up tele-health visit to limit community exposure during the Covid-19 pandemic. I spoke with the patient via phone to discuss instructions.    A Virtual Office Visit appointment type has been scheduled for 6/18 with [name], with "VIDEO/text     Raiford Simmonds, RN 02/22/2019 11:58 AM

## 2019-02-23 ENCOUNTER — Other Ambulatory Visit: Payer: Self-pay

## 2019-02-23 ENCOUNTER — Encounter: Payer: Self-pay | Admitting: Family

## 2019-02-23 ENCOUNTER — Ambulatory Visit (INDEPENDENT_AMBULATORY_CARE_PROVIDER_SITE_OTHER): Payer: BC Managed Care – PPO | Admitting: Family

## 2019-02-23 VITALS — Ht 63.0 in | Wt 147.0 lb

## 2019-02-23 DIAGNOSIS — E1159 Type 2 diabetes mellitus with other circulatory complications: Secondary | ICD-10-CM

## 2019-02-23 DIAGNOSIS — Z89511 Acquired absence of right leg below knee: Secondary | ICD-10-CM

## 2019-02-23 DIAGNOSIS — L89620 Pressure ulcer of left heel, unstageable: Secondary | ICD-10-CM

## 2019-02-23 NOTE — Progress Notes (Signed)
Post-Op Visit Note   Patient: [name]           Date of Birth: [birth year]           MRN: [id number] Visit Date: 02/23/2019 PCP: Mosie Lukes, MD  Chief Complaint:  Chief Complaint  Patient presents with   Right Leg - Routine Post Op    12/31/18 right BKA   Left Foot - Follow-up    Left heel ulcer     HPI:  HPI The patient is a 53 year old woman who presents today status post right below the knee amputation on April 24.  She also has a decubitus ulcer to her left heel.  Has been doing Silvadene dressing changes to both her heel and her incision.  Wearing a shrinker over her dressing. Ortho Exam On examination of her right below the knee amputation the incision is filled in with fibrinous tissue and some eschar.  There is surrounding maceration and tenderness no erythema no odor no drainage no sign of infection of her incision.  The left heel ulcer is much improved is now 2 cm in diameter covered in eschar there is no depth no erythema no swelling  Visit Diagnoses: No diagnosis found.  Plan: Continue with daily Dial soap cleansing of both wounds.  Apply medical compression shrinker with direct skin contact foam dressings to the heel.  Weightbearing as tolerated on the left.  Follow-up in 2 weeks.  Follow-Up Instructions: Return in about 2 weeks (around 03/09/2019).   Imaging: No results found.  Orders:  No orders of the defined types were placed in this encounter.  No orders of the defined types were placed in this encounter.    PMFS History: Patient Active Problem List   Diagnosis Date Noted   Pressure injury of left heel, unstageable ([place]) 02/09/2019   Hypoglycemia    Diabetes mellitus type 2 in nonobese ([place])    Labile blood pressure    Labile blood glucose    Phantom limb pain (HCC)    Right below-knee amputee ([place]) 01/05/2019   CKD (chronic kidney disease), stage III (HCC)    Hypothyroidism    Essential hypertension    Chronic diastolic  congestive heart failure ([place])    Coronary artery disease involving coronary bypass graft of native heart without angina pectoris    Poorly controlled type 2 diabetes mellitus with peripheral neuropathy (HCC)    Acute on chronic anemia    History of below knee amputation, right ([place]) 12/31/2018   S/P CABG x 3 06/28/2018   Coronary artery disease involving native coronary artery of native heart with angina pectoris ([place]) 06/25/2018   Type 2 diabetes mellitus with vascular disease ([place]) 06/24/2018   Acute on chronic diastolic CHF (congestive heart failure) ([place]) 06/24/2018   Non-ST elevation (NSTEMI) myocardial infarction ([place]) 06/24/2018   Chest pain 06/23/2018   Glaucoma 09/17/2017   Diabetic retinopathy ([place]) 09/17/2017   Otitis media 11/10/2016   Decreased visual acuity 11/10/2016   Spinal stenosis, lumbar region with neurogenic claudication 10/22/2016   Other spondylosis with radiculopathy, lumbar region 10/13/2016   Lipoma of abdominal wall 10/05/2016   Severe protein-calorie malnutrition ([place]) 09/04/2016   Anemia of chronic disease 09/04/2016   Diabetic polyneuropathy associated with type 2 diabetes mellitus ([place]) 05/07/2016   Orthostatic hypotension 05/07/2016   Cerebrovascular disease 04/27/2016   N&V (nausea and vomiting) 04/01/2016   ICH (intracerebral hemorrhage) ([place]) 03/30/2016   Visit for preventive health examination 05/21/2015   Breast cancer screening  05/21/2015   Arm lesion 07/02/2014   Pain in joint, ankle and foot 04/28/2014   Pain in limb 01/13/2014   Neuropathic pain of both legs 01/13/2014   Hip pain 01/12/2014   Allergic rhinitis 03/10/2013   Back pain 03/08/2013   Depression 12/18/2012   PVD (peripheral vascular disease) ([place]) 01/21/2012   Wound of left leg 12/22/2011   Hyperlipidemia 12/06/2010   Overweight(278.02) 12/06/2010   RESTLESS LEG SYNDROME 10/25/2010   Migraine without aura 10/07/2010   Hereditary and  idiopathic peripheral neuropathy 10/07/2010   Essential hypertension, benign 10/07/2010   DISTURBANCE OF SKIN SENSATION 10/07/2010   HEART MURMUR, HX OF 10/07/2010   NEPHROLITHIASIS, HX OF 10/07/2010   Past Medical History:  Diagnosis Date   Abscess of great toe, right    Acute on chronic diastolic CHF (congestive heart failure) ([place]) 06/24/2018   Acute osteomyelitis of toe, left ([place]) 09/05/2016   Amputation of right great toe ([place]) 12/22/2018   Anxiety    Cellulitis of foot, right 12/11/2018   COMMON MIGRAINE 10/07/2010   Decreased visual acuity 11/10/2016   Depression 12/18/2012   Diabetes mellitus type II, uncontrolled ([place]) 10/07/2010   Qualifier: Diagnosis of  By: Charlett Blake MD, Stacey     Diabetic foot infection ([place]) 08/26/2016   Diabetic infection of right foot ([place])    Disturbance of skin sensation 10/07/2010   Gangrene of right foot ([place])    Heart murmur    History of kidney stones    "years ago"   Hyperlipidemia 12/06/2010   Hypertension    Lipoma of abdominal wall 10/05/2016   Overweight(278.02) 12/06/2010   PERIPHERAL NEUROPATHY, FEET 10/07/2010   PVD (peripheral vascular disease) ([place]) 01/21/2012   RESTLESS LEG SYNDROME 10/25/2010   Stroke ([place]) 2014, 2017   most recently in 2/17 - intracerebral hemorrhage    Family History  Problem Relation Age of Onset   Arthritis Mother    Stroke Brother        previous smoker   Alcohol abuse Brother        in remission   Leukemia Brother    Diabetes Paternal Grandmother    Healthy Son     Past Surgical History:  Procedure Laterality Date   AMPUTATION Right 12/13/2018   Procedure: AMPUTATION RIGHT GREAT TOE, LOCAL RELOCATION OF TISSUE FOR WOUND CLOSURE 9cm x 3cm, VAC APPLICATION;  Surgeon: [name], MD;  Location: [facility];  Service: Orthopedics;  Laterality: Right;   AMPUTATION Right 12/31/2018   Procedure: RIGHT BELOW KNEE AMPUTATION;  Surgeon: [name], MD;  Location: [facility];  Service:  Orthopedics;  Laterality: Right;   AMPUTATION TOE Left 09/09/2016   Procedure: AMPUTATION OF LEFT GREAT TOE;  Surgeon: Milly Jakob, MD;  Location: [facility];  Service: Orthopedics;  Laterality: Left;   CARDIAC CATHETERIZATION  06/24/2018   CORONARY ARTERY BYPASS GRAFT N/A 06/28/2018   Procedure: CORONARY ARTERY BYPASS GRAFTING (CABG) times  four, using left internal mammary artery, endoscopically harvested right saphenous vein, and harvested left radial artery;  Surgeon: [name], MD;  Location: [facility];  Service: Open Heart Surgery;  Laterality: N/A;   ENDOVEIN HARVEST OF GREATER SAPHENOUS VEIN Right 06/28/2018   Procedure: ENDOVEIN HARVEST OF GREATER SAPHENOUS VEIN;  Surgeon: [name], MD;  Location: [facility];  Service: Open Heart Surgery;  Laterality: Right;   LEFT HEART CATH AND CORONARY ANGIOGRAPHY N/A 06/24/2018   Procedure: LEFT HEART CATH AND CORONARY ANGIOGRAPHY;  Surgeon: Troy Sine,  MD;  Location: [facility];  Service: Cardiovascular;  Laterality: N/A;   RADIAL ARTERY HARVEST Left 06/28/2018   Procedure: RADIAL ARTERY HARVEST;  Surgeon: [name], MD;  Location: [facility];  Service: Open Heart Surgery;  Laterality: Left;   TEE WITHOUT CARDIOVERSION N/A 06/28/2018   Procedure: TRANSESOPHAGEAL ECHOCARDIOGRAM (TEE);  Surgeon: [name], MD;  Location: [facility];  Service: Open Heart Surgery;  Laterality: N/A;   WISDOM TOOTH EXTRACTION     Social History   Occupational History   Occupation: Glass blower/designer  Tobacco Use   Smoking status: Former Smoker    Packs/day: 0.50    Quit date: 09/08/2005    Years since quitting: 13.4   Smokeless tobacco: Never Used  Substance and Sexual Activity   Alcohol use: No    Alcohol/week: 0.0 standard drinks   Drug use: Not Currently   Sexual activity: Yes    Partners: Male    Birth control/protection: None

## 2019-02-24 ENCOUNTER — Encounter: Payer: Self-pay | Admitting: Internal Medicine

## 2019-02-24 ENCOUNTER — Telehealth (INDEPENDENT_AMBULATORY_CARE_PROVIDER_SITE_OTHER): Payer: BC Managed Care – PPO | Admitting: Internal Medicine

## 2019-02-24 ENCOUNTER — Telehealth: Payer: Self-pay | Admitting: *Deleted

## 2019-02-24 VITALS — BP 132/79 | Ht 62.5 in | Wt 141.0 lb

## 2019-02-24 DIAGNOSIS — E1159 Type 2 diabetes mellitus with other circulatory complications: Secondary | ICD-10-CM

## 2019-02-24 DIAGNOSIS — E1151 Type 2 diabetes mellitus with diabetic peripheral angiopathy without gangrene: Secondary | ICD-10-CM | POA: Diagnosis not present

## 2019-02-24 DIAGNOSIS — I25119 Atherosclerotic heart disease of native coronary artery with unspecified angina pectoris: Secondary | ICD-10-CM | POA: Diagnosis not present

## 2019-02-24 DIAGNOSIS — I2510 Atherosclerotic heart disease of native coronary artery without angina pectoris: Secondary | ICD-10-CM | POA: Diagnosis not present

## 2019-02-24 DIAGNOSIS — Z4781 Encounter for orthopedic aftercare following surgical amputation: Secondary | ICD-10-CM | POA: Diagnosis not present

## 2019-02-24 DIAGNOSIS — I130 Hypertensive heart and chronic kidney disease with heart failure and stage 1 through stage 4 chronic kidney disease, or unspecified chronic kidney disease: Secondary | ICD-10-CM | POA: Diagnosis not present

## 2019-02-24 DIAGNOSIS — L89890 Pressure ulcer of other site, unstageable: Secondary | ICD-10-CM | POA: Diagnosis not present

## 2019-02-24 DIAGNOSIS — G546 Phantom limb syndrome with pain: Secondary | ICD-10-CM | POA: Diagnosis not present

## 2019-02-24 DIAGNOSIS — I739 Peripheral vascular disease, unspecified: Secondary | ICD-10-CM

## 2019-02-24 DIAGNOSIS — I214 Non-ST elevation (NSTEMI) myocardial infarction: Secondary | ICD-10-CM

## 2019-02-24 DIAGNOSIS — Z89511 Acquired absence of right leg below knee: Secondary | ICD-10-CM

## 2019-02-24 DIAGNOSIS — Z951 Presence of aortocoronary bypass graft: Secondary | ICD-10-CM

## 2019-02-24 DIAGNOSIS — I10 Essential (primary) hypertension: Secondary | ICD-10-CM | POA: Diagnosis not present

## 2019-02-24 DIAGNOSIS — I872 Venous insufficiency (chronic) (peripheral): Secondary | ICD-10-CM | POA: Diagnosis not present

## 2019-02-24 DIAGNOSIS — E785 Hyperlipidemia, unspecified: Secondary | ICD-10-CM | POA: Diagnosis not present

## 2019-02-24 DIAGNOSIS — E1122 Type 2 diabetes mellitus with diabetic chronic kidney disease: Secondary | ICD-10-CM | POA: Diagnosis not present

## 2019-02-24 DIAGNOSIS — T8743 Infection of amputation stump, right lower extremity: Secondary | ICD-10-CM | POA: Diagnosis not present

## 2019-02-24 DIAGNOSIS — N183 Chronic kidney disease, stage 3 (moderate): Secondary | ICD-10-CM | POA: Diagnosis not present

## 2019-02-24 DIAGNOSIS — I5032 Chronic diastolic (congestive) heart failure: Secondary | ICD-10-CM

## 2019-02-24 NOTE — Patient Instructions (Addendum)
  Medication Instructions:   No changes - continue  metoprolol tartrate 50 mg twice a day and all other medications   Lab work: Not needed   If you have labs (blood work) drawn today and your tests are completely normal, you will receive your results only by: Marland Kitchen MyChart Message (if you have MyChart) OR . A paper copy in the mail If you have any lab test that is abnormal or we need to change your treatment, we will call you to review the results.  Testing/Procedures: Not needed  Follow-Up: At [facility], you and your health needs are our priority.  As part of our continuing mission to provide you with exceptional heart care, we have created designated Provider Care Teams.  These Care Teams include your primary Cardiologist (physician) and Advanced Practice Providers (APPs -  Physician Assistants and Nurse Practitioners) who all work together to provide you with the care you need, when you need it. . You will need a follow up appointment in 3  Months SEPT 2020- can be virtual .  Please call our office 2 months in advance to schedule this appointment.  You may see Tiffany Kennesaw,MD* or one of the following Advanced Practice Providers on your designated Care Team:     Any Other Special Instructions Will Be Listed Below (If Applicable).

## 2019-02-24 NOTE — Telephone Encounter (Signed)
Spoke to patient - instruction given to patient from tel-visit 6/18  W/ Dr [name] .  avs summary mailed to patient. Patient verbalized understanding

## 2019-02-24 NOTE — Progress Notes (Signed)
Virtual Visit via Video Note   This visit type was conducted due to national recommendations for restrictions regarding the COVID-19 Pandemic (e.g. social distancing) in an effort to limit this patient's exposure and mitigate transmission in our community.  Due to her co-morbid illnesses, this patient is at least at moderate risk for complications without adequate follow up.  This format is felt to be most appropriate for this patient at this time.  All issues noted in this document were discussed and addressed.  A limited physical exam was performed with this format.  Please refer to the patient's chart for her consent to telehealth for [facility].   Date:  02/24/2019   ID:  [name], DOB 11-29-1965, MRN [id number]  Patient Location: Home Provider Location: Office  PCP:  Mosie Lukes, MD  Cardiologist:  Elouise Munroe, MD  Electrophysiologist:  None   Evaluation Performed:  Follow-Up Visit  Chief Complaint:  F/u CABG.  History of Present Illness:    [name] is a 53 y.o. female with chronic kidney disease stage IIl, chronic diastolic congestive heart failure,CAD with CABG October 2019, hypertension, intracerebral hemorrhage February 2017,hyperlipidemia,remote tobacco use 13 years ago, diabetes mellitus with peripheral vascular disease with right foot gangrene in April 2020 and subsequent right below the knee amputation. I have requested the patient follow up in cardiology after some previously missed follow ups to ensure adequate follow up from a cardiovascular perspective.   She underwent CABG x4 June 28, 2018, and was dismissed home on amlodipine, metoprolol, Imdur, Lasix, atorvastatin.  She was intended to follow-up in 1 month in my clinic, however was unable to attend this appointment.  She did fortunately follow-up with her primary care physician several weeks after hospital dismissal.  She was also seen in surgical follow-up in November and December.  She  was noted to have wound infection of her left radial incision site.  She has a known history of severe type 2 diabetes requiring insulin with diabetic neuropathy and multiple vascular complicatiAt the time of her surgical follow-up in January, incisions were well-healing with no signs of infection.  In February she presented to the emergency department with right foot pain redness and swelling for 2 days.  She was found to have sepsis secondary to right foot cellulitis requiring amputation of the right first ray.She developed gangrenous changes and wound dehiscence in the right foot with low-grade fevers and required below the knee amputation.  She has recently completed therapy from an orthopedic perspective.  Overall from a cardiovascular perspective she is doing reasonably well.  She denies any chest pain with her physical therapy.  She followed up with her primary care doctor 1 month ago and was overall asymptomatic.  She is still struggling with the loss of her husband which occurred suddenly just prior to her bypass surgery in October.  She continues on aspirin 81 mg daily, atorvastatin 80 mg daily, Imdur 15 mg daily, metoprolol tartrate 50 mg twice daily.  She is also taking amlodipine 5 mg daily.  Not currently on ACE or ARB likely secondary to renal dysfunction.  She has no specific cardiovascular concerns today. The patient denies chest pain, chest pressure, dyspnea at rest or with exertion, palpitations, PND, orthopnea, or swelling. Denies syncope or presyncope. Denies dizziness or lightheadedness.   The patient does not have symptoms concerning for COVID-19 infection (fever, chills, cough, or new shortness of breath).    Past Medical History:  Diagnosis Date  .  Abscess of great toe, right   . Acute on chronic diastolic CHF (congestive heart failure) ([place]) 06/24/2018  . Acute osteomyelitis of toe, left ([place]) 09/05/2016  . Amputation of right great toe ([place]) 12/22/2018  . Anxiety   .  Cellulitis of foot, right 12/11/2018  . COMMON MIGRAINE 10/07/2010  . Decreased visual acuity 11/10/2016  . Depression 12/18/2012  . Diabetes mellitus type II, uncontrolled ([place]) 10/07/2010   Qualifier: Diagnosis of  By: Charlett Blake MD, [name]    . Diabetic foot infection ([place]) 08/26/2016  . Diabetic infection of right foot ([place])   . Disturbance of skin sensation 10/07/2010  . Gangrene of right foot ([place])   . Heart murmur   . History of kidney stones    "years ago"  . Hyperlipidemia 12/06/2010  . Hypertension   . Lipoma of abdominal wall 10/05/2016  . Overweight(278.02) 12/06/2010  . PERIPHERAL NEUROPATHY, FEET 10/07/2010  . PVD (peripheral vascular disease) ([place]) 01/21/2012  . RESTLESS LEG SYNDROME 10/25/2010  . Stroke [facility]) 2014, 2017   most recently in 2/17 - intracerebral hemorrhage   Past Surgical History:  Procedure Laterality Date  . AMPUTATION Right 12/13/2018   Procedure: AMPUTATION RIGHT GREAT TOE, LOCAL RELOCATION OF TISSUE FOR WOUND CLOSURE 9cm x 3cm, VAC APPLICATION;  Surgeon: [name], MD;  Location: [facility];  Service: Orthopedics;  Laterality: Right;  . AMPUTATION Right 12/31/2018   Procedure: RIGHT BELOW KNEE AMPUTATION;  Surgeon: [name], MD;  Location: [facility];  Service: Orthopedics;  Laterality: Right;  . AMPUTATION TOE Left 09/09/2016   Procedure: AMPUTATION OF LEFT GREAT TOE;  Surgeon: Milly Jakob, MD;  Location: [facility];  Service: Orthopedics;  Laterality: Left;  . CARDIAC CATHETERIZATION  06/24/2018  . CORONARY ARTERY BYPASS GRAFT N/A 06/28/2018   Procedure: CORONARY ARTERY BYPASS GRAFTING (CABG) times  four, using left internal mammary artery, endoscopically harvested right saphenous vein, and harvested left radial artery;  Surgeon: [name], MD;  Location: [facility];  Service: Open Heart Surgery;  Laterality: N/A;  . ENDOVEIN HARVEST OF GREATER SAPHENOUS VEIN Right 06/28/2018   Procedure: ENDOVEIN HARVEST OF GREATER SAPHENOUS VEIN;  Surgeon:  [name], MD;  Location: [facility];  Service: Open Heart Surgery;  Laterality: Right;  . LEFT HEART CATH AND CORONARY ANGIOGRAPHY N/A 06/24/2018   Procedure: LEFT HEART CATH AND CORONARY ANGIOGRAPHY;  Surgeon: Troy Sine, MD;  Location: [facility] CV [facility];  Service: Cardiovascular;  Laterality: N/A;  . RADIAL ARTERY HARVEST Left 06/28/2018   Procedure: RADIAL ARTERY HARVEST;  Surgeon: [name], MD;  Location: [facility];  Service: Open Heart Surgery;  Laterality: Left;  . TEE WITHOUT CARDIOVERSION N/A 06/28/2018   Procedure: TRANSESOPHAGEAL ECHOCARDIOGRAM (TEE);  Surgeon: [name], MD;  Location: [facility];  Service: Open Heart Surgery;  Laterality: N/A;  . WISDOM TOOTH EXTRACTION       Current Meds  Medication Sig  . acetaminophen (TYLENOL) 325 MG tablet Take 2 tablets (650 mg total) by mouth every 6 (six) hours as needed for mild pain or headache.  Marland Kitchen amitriptyline (ELAVIL) 10 MG tablet Take 0.5 tablets (5 mg total) by mouth at bedtime.  Marland Kitchen amLODipine (NORVASC) 5 MG tablet Take 1 tablet (5 mg total) by mouth daily.  Marland Kitchen aspirin EC 81 MG tablet Take 81 mg by mouth daily.  Marland Kitchen atorvastatin (LIPITOR) 80 MG tablet Take 1 tablet (80 mg total) by mouth daily at 6 PM.  . azelastine (ASTELIN) 0.1 %  nasal spray Place 2 sprays into both nostrils 2 (two) times daily. Use in each nostril as directed (Patient taking differently: Place 2 sprays into both nostrils 2 (two) times daily as needed for allergies. Use in each nostril as directed)  . Blood Glucose Monitoring Suppl (ONE TOUCH ULTRA 2) w/Device KIT Check blood sugars twice daily. Dx:E11.59  . docusate sodium (COLACE) 100 MG capsule Take 1 capsule (100 mg total) by mouth 2 (two) times daily.  Marland Kitchen escitalopram (LEXAPRO) 10 MG tablet Take 1 tablet (10 mg total) by mouth daily.  . fluticasone (FLONASE) 50 MCG/ACT nasal spray SPRAY 2 SPRAYS INTO EACH NOSTRIL EVERY DAY (Patient taking differently: Place 2 sprays into both nostrils  daily as needed for allergies. )  . Insulin Glargine (LANTUS) 100 UNIT/ML Solostar Pen Inject 15 Units into the skin 2 (two) times daily.  . Insulin Pen Needle (PEN NEEDLES) 31G X 5 MM MISC 1 Syringe by Does not apply route daily. To use with Lantus Pen  . insulin regular (HUMULIN R) 100 units/mL injection Inject 0.04 mLs (4 Units total) into the skin every evening.  . isosorbide mononitrate (IMDUR) 30 MG 24 hr tablet Take 0.5 tablets (15 mg total) by mouth daily. For one month then stop.  Marland Kitchen levothyroxine (SYNTHROID) 25 MCG tablet TAKE 1 TABLET (25 MCG TOTAL) BY MOUTH DAILY BEFORE BREAKFAST.  Marland Kitchen metoCLOPramide (REGLAN) 5 MG tablet Take 1 tablet (5 mg total) by mouth 4 (four) times daily -  before meals and at bedtime.  . metoprolol tartrate (LOPRESSOR) 50 MG tablet Take 50 mg by mouth 2 (two) times daily.  . Multiple Vitamin (MULTIVITAMIN WITH MINERALS) TABS tablet Take 1 tablet by mouth daily.  . ondansetron (ZOFRAN) 4 MG tablet Take 1 tablet (4 mg total) by mouth 2 (two) times daily as needed for nausea or vomiting.  . ONE TOUCH ULTRA TEST test strip USE ONCE DAILY TO CHECK BLOOD SUGAR. DX E11.9  . pantoprazole (PROTONIX) 40 MG tablet Take 1 tablet (40 mg total) by mouth daily.  . silver sulfADIAZINE (SILVADENE) 1 % cream Apply 1 application topically daily.     Allergies:   Penicillins, Lyrica [pregabalin], Morphine and related, and Tramadol   Social History   Tobacco Use  . Smoking status: Former Smoker    Packs/day: 0.50    Quit date: 09/08/2005    Years since quitting: 13.5  . Smokeless tobacco: Never Used  Substance Use Topics  . Alcohol use: No    Alcohol/week: 0.0 standard drinks  . Drug use: Not Currently     Family Hx: The patient's family history includes Alcohol abuse in her brother; Arthritis in her mother; Diabetes in her paternal grandmother; Healthy in her son; Leukemia in her brother; Stroke in her brother.  ROS:   Please see the history of present illness.     All  other systems reviewed and are negative.   Prior CV studies:   The following studies were reviewed today:    Labs/Other Tests and Data Reviewed:    EKG:  No ECG reviewed.  Recent Labs: 06/23/2018: B Natriuretic Peptide 335.2 07/02/2018: TSH 3.396 12/16/2018: Magnesium 2.1 01/06/2019: ALT 10 01/14/2019: BUN 23; Creatinine, Ser 1.21; Hemoglobin 8.8; Platelets 613; Potassium 3.9; Sodium 138   Recent Lipid Panel Lab Results  Component Value Date/Time   CHOL 200 06/24/2018 10:45 AM   TRIG 127 06/24/2018 10:45 AM   HDL 36 (L) 06/24/2018 10:45 AM   CHOLHDL 5.6 06/24/2018 10:45 AM   LDLCALC  139 (H) 06/24/2018 10:45 AM   LDLDIRECT 133.0 03/02/2017 08:38 AM    Wt Readings from Last 3 Encounters:  02/24/19 141 lb (64 kg)  02/23/19 147 lb (66.7 kg)  02/09/19 147 lb (66.7 kg)     Objective:    Vital Signs:  BP 132/79   Ht 5' 2.5" (1.588 m)   Wt 141 lb (64 kg)   LMP 02/12/2011 (Exact Date)   BMI 25.38 kg/m    VITAL SIGNS:  reviewed GEN:  no acute distress EYES:  sclerae anicteric, EOMI - Extraocular Movements Intact RESPIRATORY:  normal respiratory effort, symmetric expansion CARDIOVASCULAR:  no peripheral edema SKIN:  sternotomy incision well healing.  MUSCULOSKELETAL:  right bka, skin and incision not assessed by virtual visit NEURO:  alert and oriented x 3, no obvious focal deficit PSYCH:  normal affect  ASSESSMENT & PLAN:    1. S/P CABG x 4   2. Essential hypertension   3. Chronic diastolic congestive heart failure ([place])   4. Coronary artery disease involving native coronary artery of native heart with angina pectoris ([place])   5. Type 2 diabetes mellitus with vascular disease ([place])   6. Non-ST elevation (NSTEMI) myocardial infarction ([place])   7. PVD (peripheral vascular disease) ([place])   8. Hyperlipidemia, unspecified hyperlipidemia type   9. Right below-knee amputee [facility])    CAD status post CABG- she is currently symptom-free and overall doing reasonably well  considering her very challenging past 8 months including losing her husband, undergoing bypass surgery after NSTEMI and subsequently undergoing right below the knee amputation.  She continues on appropriate medical therapy, no changes required today.  Diabetes mellitus type 2- per primary care management, we discussed the implications of uncontrolled diabetes and cardiovascular disease.  We will want to consider SGLT2 inhibitor for management of diabetes in the setting of CAD.  Hyperlipidemia-on atorvastatin.  Consider repeat lipid panel and intensification of therapy if LDL not less than 70.  Hypertension- on metoprolol, amlodipine, Imdur.  Stable.  COVID-19 Education: The signs and symptoms of COVID-19 were discussed with the patient and how to seek care for testing (follow up with PCP or arrange E-visit).  The importance of social distancing was discussed today.  Time:   Today, I have spent 25 minutes with the patient with telehealth technology discussing the above problems.     Medication Adjustments/Labs and Tests Ordered: Current medicines are reviewed at length with the patient today.  Concerns regarding medicines are outlined above.   Tests Ordered: No orders of the defined types were placed in this encounter.   Medication Changes: No orders of the defined types were placed in this encounter.   Follow Up:  Virtual Visit or In Person in 3 month(s)  Signed, Elouise Munroe, MD  02/24/2019 4:46 PM    [facility]

## 2019-03-02 DIAGNOSIS — N183 Chronic kidney disease, stage 3 (moderate): Secondary | ICD-10-CM | POA: Diagnosis not present

## 2019-03-02 DIAGNOSIS — E1122 Type 2 diabetes mellitus with diabetic chronic kidney disease: Secondary | ICD-10-CM | POA: Diagnosis not present

## 2019-03-02 DIAGNOSIS — G546 Phantom limb syndrome with pain: Secondary | ICD-10-CM | POA: Diagnosis not present

## 2019-03-02 DIAGNOSIS — E785 Hyperlipidemia, unspecified: Secondary | ICD-10-CM | POA: Diagnosis not present

## 2019-03-02 DIAGNOSIS — T8743 Infection of amputation stump, right lower extremity: Secondary | ICD-10-CM | POA: Diagnosis not present

## 2019-03-02 DIAGNOSIS — I872 Venous insufficiency (chronic) (peripheral): Secondary | ICD-10-CM | POA: Diagnosis not present

## 2019-03-02 DIAGNOSIS — Z89511 Acquired absence of right leg below knee: Secondary | ICD-10-CM | POA: Diagnosis not present

## 2019-03-02 DIAGNOSIS — I5032 Chronic diastolic (congestive) heart failure: Secondary | ICD-10-CM | POA: Diagnosis not present

## 2019-03-02 DIAGNOSIS — L89890 Pressure ulcer of other site, unstageable: Secondary | ICD-10-CM | POA: Diagnosis not present

## 2019-03-02 DIAGNOSIS — E1151 Type 2 diabetes mellitus with diabetic peripheral angiopathy without gangrene: Secondary | ICD-10-CM | POA: Diagnosis not present

## 2019-03-02 DIAGNOSIS — I130 Hypertensive heart and chronic kidney disease with heart failure and stage 1 through stage 4 chronic kidney disease, or unspecified chronic kidney disease: Secondary | ICD-10-CM | POA: Diagnosis not present

## 2019-03-02 DIAGNOSIS — I2510 Atherosclerotic heart disease of native coronary artery without angina pectoris: Secondary | ICD-10-CM | POA: Diagnosis not present

## 2019-03-02 DIAGNOSIS — Z4781 Encounter for orthopedic aftercare following surgical amputation: Secondary | ICD-10-CM | POA: Diagnosis not present

## 2019-03-03 ENCOUNTER — Telehealth: Payer: Self-pay | Admitting: Family Medicine

## 2019-03-03 NOTE — Telephone Encounter (Signed)
Spoke w/ Beth- verbal orders given.

## 2019-03-03 NOTE — Telephone Encounter (Signed)
Home Health Verbal Orders - Caller/Agency Beth with advance home health Callback Number: [phone] Requesting OT/PT/Skilled Nursing/Social Work/Speech Therapy: need verbals for PT , she can now put weight on the the left foot

## 2019-03-07 ENCOUNTER — Ambulatory Visit: Payer: BLUE CROSS/BLUE SHIELD | Admitting: Family Medicine

## 2019-03-07 DIAGNOSIS — I872 Venous insufficiency (chronic) (peripheral): Secondary | ICD-10-CM | POA: Diagnosis not present

## 2019-03-07 DIAGNOSIS — L89620 Pressure ulcer of left heel, unstageable: Secondary | ICD-10-CM | POA: Diagnosis not present

## 2019-03-07 DIAGNOSIS — G546 Phantom limb syndrome with pain: Secondary | ICD-10-CM | POA: Diagnosis not present

## 2019-03-07 DIAGNOSIS — T8743 Infection of amputation stump, right lower extremity: Secondary | ICD-10-CM | POA: Diagnosis not present

## 2019-03-07 DIAGNOSIS — E1122 Type 2 diabetes mellitus with diabetic chronic kidney disease: Secondary | ICD-10-CM | POA: Diagnosis not present

## 2019-03-07 DIAGNOSIS — Z4781 Encounter for orthopedic aftercare following surgical amputation: Secondary | ICD-10-CM | POA: Diagnosis not present

## 2019-03-07 DIAGNOSIS — L89890 Pressure ulcer of other site, unstageable: Secondary | ICD-10-CM | POA: Diagnosis not present

## 2019-03-07 DIAGNOSIS — I130 Hypertensive heart and chronic kidney disease with heart failure and stage 1 through stage 4 chronic kidney disease, or unspecified chronic kidney disease: Secondary | ICD-10-CM | POA: Diagnosis not present

## 2019-03-07 DIAGNOSIS — E1151 Type 2 diabetes mellitus with diabetic peripheral angiopathy without gangrene: Secondary | ICD-10-CM | POA: Diagnosis not present

## 2019-03-07 DIAGNOSIS — Z89511 Acquired absence of right leg below knee: Secondary | ICD-10-CM | POA: Diagnosis not present

## 2019-03-07 DIAGNOSIS — E785 Hyperlipidemia, unspecified: Secondary | ICD-10-CM | POA: Diagnosis not present

## 2019-03-07 DIAGNOSIS — I2510 Atherosclerotic heart disease of native coronary artery without angina pectoris: Secondary | ICD-10-CM | POA: Diagnosis not present

## 2019-03-07 DIAGNOSIS — N183 Chronic kidney disease, stage 3 (moderate): Secondary | ICD-10-CM | POA: Diagnosis not present

## 2019-03-07 DIAGNOSIS — Z4801 Encounter for change or removal of surgical wound dressing: Secondary | ICD-10-CM | POA: Diagnosis not present

## 2019-03-07 DIAGNOSIS — I5032 Chronic diastolic (congestive) heart failure: Secondary | ICD-10-CM | POA: Diagnosis not present

## 2019-03-09 ENCOUNTER — Ambulatory Visit (INDEPENDENT_AMBULATORY_CARE_PROVIDER_SITE_OTHER): Payer: BC Managed Care – PPO | Admitting: Family

## 2019-03-09 ENCOUNTER — Telehealth: Payer: Self-pay | Admitting: *Deleted

## 2019-03-09 ENCOUNTER — Other Ambulatory Visit: Payer: Self-pay

## 2019-03-09 ENCOUNTER — Encounter: Payer: Self-pay | Admitting: Family

## 2019-03-09 VITALS — Ht 62.0 in | Wt 141.0 lb

## 2019-03-09 DIAGNOSIS — I739 Peripheral vascular disease, unspecified: Secondary | ICD-10-CM

## 2019-03-09 DIAGNOSIS — I214 Non-ST elevation (NSTEMI) myocardial infarction: Secondary | ICD-10-CM

## 2019-03-09 DIAGNOSIS — Z951 Presence of aortocoronary bypass graft: Secondary | ICD-10-CM

## 2019-03-09 DIAGNOSIS — E785 Hyperlipidemia, unspecified: Secondary | ICD-10-CM

## 2019-03-09 DIAGNOSIS — Z89511 Acquired absence of right leg below knee: Secondary | ICD-10-CM

## 2019-03-09 NOTE — Telephone Encounter (Signed)
-----   Message from Elouise Munroe, MD sent at 03/07/2019  4:55 PM EDT ----- Can you set her up to have a lipid panel sometime soon so we can decide if we need to intensify her lipid therapy? Thanks! GA

## 2019-03-09 NOTE — Progress Notes (Signed)
Post-Op Visit Note   Patient: [name]           Date of Birth: 10/24/1965           MRN: [id number] Visit Date: 03/09/2019 PCP: Mosie Lukes, MD  Chief Complaint:  Chief Complaint  Patient presents with  . Right Leg - Routine Post Op    12/31/18 right BKA   . Left Foot - Follow-up    Heel ulcer    HPI:  HPI The patient is a 53 year old woman who presents today status post right below the knee amputation on April 24.  She also has a decubitus ulcer to her left heel.  Has been wearing a shrinker over incision with direct skin contact. Dry dressing to heel.  Ortho Exam On examination of her right below the knee amputation the incision is filled in with 85% eschar.  There is no surrounding maceration. no erythema no odor no drainage no sign of infection.  The left heel ulcer continues to improve, is now 15 mm in diameter covered in eschar there is no depth no erythema no swelling. No drainage.  Visit Diagnoses:  1. History of below knee amputation, right ([place])     Plan: Continue with daily Dial soap cleansing of both wounds.  Apply iodosorb and gauze dressing to BKA with shrinker over. Dry dressing to left heel. Weightbearing as tolerated on the left.  Follow-up in 2 weeks.  Follow-Up Instructions: Return in about 2 weeks (around 03/23/2019).   Imaging: No results found.  Orders:  No orders of the defined types were placed in this encounter.  No orders of the defined types were placed in this encounter.    PMFS History: Patient Active Problem List   Diagnosis Date Noted  . Pressure injury of left heel, unstageable ([place]) 02/09/2019  . Hypoglycemia   . Diabetes mellitus type 2 in nonobese (HCC)   . Labile blood pressure   . Labile blood glucose   . Phantom limb pain ([place])   . Right below-knee amputee ([place]) 01/05/2019  . CKD (chronic kidney disease), stage III ([place])   . Hypothyroidism   . Essential hypertension   . Chronic diastolic congestive heart failure  ([place])   . Coronary artery disease involving coronary bypass graft of native heart without angina pectoris   . Poorly controlled type 2 diabetes mellitus with peripheral neuropathy ([place])   . Acute on chronic anemia   . History of below knee amputation, right ([place]) 12/31/2018  . S/P CABG x 3 06/28/2018  . Coronary artery disease involving native coronary artery of native heart with angina pectoris ([place]) 06/25/2018  . Type 2 diabetes mellitus with vascular disease ([place]) 06/24/2018  . Acute on chronic diastolic CHF (congestive heart failure) ([place]) 06/24/2018  . Non-ST elevation (NSTEMI) myocardial infarction ([place]) 06/24/2018  . Chest pain 06/23/2018  . Glaucoma 09/17/2017  . Diabetic retinopathy ([place]) 09/17/2017  . Otitis media 11/10/2016  . Decreased visual acuity 11/10/2016  . Spinal stenosis, lumbar region with neurogenic claudication 10/22/2016  . Other spondylosis with radiculopathy, lumbar region 10/13/2016  . Lipoma of abdominal wall 10/05/2016  . Severe protein-calorie malnutrition ([place]) 09/04/2016  . Anemia of chronic disease 09/04/2016  . Diabetic polyneuropathy associated with type 2 diabetes mellitus ([place]) 05/07/2016  . Orthostatic hypotension 05/07/2016  . Cerebrovascular disease 04/27/2016  . N&V (nausea and vomiting) 04/01/2016  . ICH (intracerebral hemorrhage) ([place]) 03/30/2016  . Visit for preventive health examination 05/21/2015  . Breast cancer screening  05/21/2015  . Arm lesion 07/02/2014  . Pain in limb 01/13/2014  . Neuropathic pain of both legs 01/13/2014  . Hip pain 01/12/2014  . Allergic rhinitis 03/10/2013  . Back pain 03/08/2013  . Depression 12/18/2012  . PVD (peripheral vascular disease) ([place]) 01/21/2012  . Hyperlipidemia 12/06/2010  . Overweight(278.02) 12/06/2010  . RESTLESS LEG SYNDROME 10/25/2010  . Migraine without aura 10/07/2010  . Hereditary and idiopathic peripheral neuropathy 10/07/2010  . Essential hypertension, benign 10/07/2010  .  DISTURBANCE OF SKIN SENSATION 10/07/2010  . HEART MURMUR, HX OF 10/07/2010  . NEPHROLITHIASIS, HX OF 10/07/2010   Past Medical History:  Diagnosis Date  . Abscess of great toe, right   . Acute on chronic diastolic CHF (congestive heart failure) ([place]) 06/24/2018  . Acute osteomyelitis of toe, left ([place]) 09/05/2016  . Amputation of right great toe ([place]) 12/22/2018  . Anxiety   . Cellulitis of foot, right 12/11/2018  . COMMON MIGRAINE 10/07/2010  . Decreased visual acuity 11/10/2016  . Depression 12/18/2012  . Diabetes mellitus type II, uncontrolled ([place]) 10/07/2010   Qualifier: Diagnosis of  By: Charlett Blake MD, [name]    . Diabetic foot infection ([place]) 08/26/2016  . Diabetic infection of right foot ([place])   . Disturbance of skin sensation 10/07/2010  . Gangrene of right foot ([place])   . Heart murmur   . History of kidney stones    "years ago"  . Hyperlipidemia 12/06/2010  . Hypertension   . Lipoma of abdominal wall 10/05/2016  . Overweight(278.02) 12/06/2010  . PERIPHERAL NEUROPATHY, FEET 10/07/2010  . PVD (peripheral vascular disease) ([place]) 01/21/2012  . RESTLESS LEG SYNDROME 10/25/2010  . Stroke [facility]) 2014, 2017   most recently in 2/17 - intracerebral hemorrhage    Family History  Problem Relation Age of Onset  . Arthritis Mother   . Stroke Brother        previous smoker  . Alcohol abuse Brother        in remission  . Leukemia Brother   . Diabetes Paternal Grandmother   . Healthy Son     Past Surgical History:  Procedure Laterality Date  . AMPUTATION Right 12/13/2018   Procedure: AMPUTATION RIGHT GREAT TOE, LOCAL RELOCATION OF TISSUE FOR WOUND CLOSURE 9cm x 3cm, VAC APPLICATION;  Surgeon: [name], MD;  Location: [facility];  Service: Orthopedics;  Laterality: Right;  . AMPUTATION Right 12/31/2018   Procedure: RIGHT BELOW KNEE AMPUTATION;  Surgeon: [name], MD;  Location: [facility];  Service: Orthopedics;  Laterality: Right;  . AMPUTATION TOE Left 09/09/2016   Procedure: AMPUTATION OF LEFT  GREAT TOE;  Surgeon: Milly Jakob, MD;  Location: [facility];  Service: Orthopedics;  Laterality: Left;  . CARDIAC CATHETERIZATION  06/24/2018  . CORONARY ARTERY BYPASS GRAFT N/A 06/28/2018   Procedure: CORONARY ARTERY BYPASS GRAFTING (CABG) times  four, using left internal mammary artery, endoscopically harvested right saphenous vein, and harvested left radial artery;  Surgeon: [name], MD;  Location: [facility];  Service: Open Heart Surgery;  Laterality: N/A;  . ENDOVEIN HARVEST OF GREATER SAPHENOUS VEIN Right 06/28/2018   Procedure: ENDOVEIN HARVEST OF GREATER SAPHENOUS VEIN;  Surgeon: [name], MD;  Location: [facility];  Service: Open Heart Surgery;  Laterality: Right;  . LEFT HEART CATH AND CORONARY ANGIOGRAPHY N/A 06/24/2018   Procedure: LEFT HEART CATH AND CORONARY ANGIOGRAPHY;  Surgeon: Troy Sine, MD;  Location: [facility];  Service: Cardiovascular;  Laterality: N/A;  . RADIAL  ARTERY HARVEST Left 06/28/2018   Procedure: RADIAL ARTERY HARVEST;  Surgeon: [name], MD;  Location: [facility];  Service: Open Heart Surgery;  Laterality: Left;  . TEE WITHOUT CARDIOVERSION N/A 06/28/2018   Procedure: TRANSESOPHAGEAL ECHOCARDIOGRAM (TEE);  Surgeon: [name], MD;  Location: [facility];  Service: Open Heart Surgery;  Laterality: N/A;  . WISDOM TOOTH EXTRACTION     Social History   Occupational History  . Occupation: Glass blower/designer  Tobacco Use  . Smoking status: Former Smoker    Packs/day: 0.50    Quit date: 09/08/2005    Years since quitting: 13.5  . Smokeless tobacco: Never Used  Substance and Sexual Activity  . Alcohol use: No    Alcohol/week: 0.0 standard drinks  . Drug use: Not Currently  . Sexual activity: Yes    Partners: Male    Birth control/protection: None

## 2019-03-09 NOTE — Telephone Encounter (Signed)
SPOKE TO PATIENT  SHE WAS  INFORMED DR [name] WOULD LIKE FOR HER TO HAVE LIPID  TEST. IN FORMED PATIENT WILL MAIL LABSLIP W/ INSTRUCTIONS - PATIENT AWARE TO COME TO OFFICE OR SHE CAN CONTACT PRIMARY TO SEE IF LABS CAN BE DONE THERE. PATIENT VERBALIZED UNDERSTANDING

## 2019-03-16 ENCOUNTER — Ambulatory Visit (INDEPENDENT_AMBULATORY_CARE_PROVIDER_SITE_OTHER): Payer: BC Managed Care – PPO | Admitting: Family

## 2019-03-16 ENCOUNTER — Telehealth: Payer: Self-pay | Admitting: Family

## 2019-03-16 ENCOUNTER — Other Ambulatory Visit: Payer: Self-pay

## 2019-03-16 ENCOUNTER — Encounter: Payer: Self-pay | Admitting: Family

## 2019-03-16 VITALS — Ht 62.0 in | Wt 141.0 lb

## 2019-03-16 DIAGNOSIS — Z89511 Acquired absence of right leg below knee: Secondary | ICD-10-CM

## 2019-03-16 DIAGNOSIS — S88111A Complete traumatic amputation at level between knee and ankle, right lower leg, initial encounter: Secondary | ICD-10-CM | POA: Diagnosis not present

## 2019-03-16 DIAGNOSIS — I5032 Chronic diastolic (congestive) heart failure: Secondary | ICD-10-CM | POA: Diagnosis not present

## 2019-03-16 DIAGNOSIS — L89620 Pressure ulcer of left heel, unstageable: Secondary | ICD-10-CM

## 2019-03-16 NOTE — Telephone Encounter (Signed)
Erin please advise thank you

## 2019-03-16 NOTE — Telephone Encounter (Signed)
Patient called advised the pharmacy do not have the Rx for her antibiotic yet. [facility]  [place]. The number to contact patient is [phone]

## 2019-03-18 ENCOUNTER — Other Ambulatory Visit: Payer: Self-pay

## 2019-03-18 ENCOUNTER — Telehealth: Payer: Self-pay

## 2019-03-18 MED ORDER — DOXYCYCLINE HYCLATE 50 MG PO CAPS: 50.0000 mg | ORAL_CAPSULE | Freq: Two times a day (BID) | ORAL | 0 refills | Status: DC

## 2019-03-18 NOTE — Telephone Encounter (Signed)
Talked with patient's son Tommi Rumps and advised him that [facility] antibiotic [facility] [place].

## 2019-03-18 NOTE — Telephone Encounter (Signed)
Patient's husband called concerning Rx for antibiotic.  Per [facility], [facility] [place].

## 2019-03-22 ENCOUNTER — Encounter: Payer: Self-pay | Admitting: Family

## 2019-03-22 NOTE — Progress Notes (Signed)
Post-Op Visit Note   Patient: [name]           Date of Birth: [birth year]           MRN: [id number] Visit Date: 03/16/2019 PCP: Mosie Lukes, MD  Chief Complaint:  Chief Complaint  Patient presents with  . Right Leg - Routine Post Op    12/31/18 right BKA     HPI:  HPI The patient is a 53 year old woman who presents today status post right below the knee amputation on April 24.  She also has a decubitus ulcer to her left heel.  Has been wearing a shrinker over incision with direct skin contact. Dry dressing to heel. Her wound has been very slow to heal.  Ortho Exam On examination of her right below the knee amputation the incision is filled in with 25% eschar and 75% fibrinous tissue.  There is no surrounding maceration. no erythema no odor no drainage no sign of infection.  Very scant new epithelialization since last viewing.  The left heel ulcer continues to improve, is now 12 mm in diameter covered in eschar there is no depth no erythema no swelling. No drainage.  With Doppler biphasic pulse to the right foot.  Was difficult to palpate.  Visit Diagnoses:  1. Right below-knee amputee ([place])   2. Pressure injury of left heel, unstageable ([place])     Plan: Continue with daily Dial soap cleansing of both wounds.  Apply iodosorb and gauze dressing to BKA with shrinker over. Dry dressing to left heel. Weightbearing as tolerated on the left.  Follow-up in 2 weeks.  Did provide another tube of Iodosorb.  Follow-Up Instructions: Return in about 3 weeks (around 04/06/2019).   Imaging: No results found.  Orders:  No orders of the defined types were placed in this encounter.  No orders of the defined types were placed in this encounter.    PMFS History: Patient Active Problem List   Diagnosis Date Noted  . Pressure injury of left heel, unstageable ([place]) 02/09/2019  . Hypoglycemia   . Diabetes mellitus type 2 in nonobese (HCC)   . Labile blood pressure   . Labile  blood glucose   . Phantom limb pain ([place])   . Right below-knee amputee ([place]) 01/05/2019  . CKD (chronic kidney disease), stage III ([place])   . Hypothyroidism   . Essential hypertension   . Chronic diastolic congestive heart failure ([place])   . Coronary artery disease involving coronary bypass graft of native heart without angina pectoris   . Poorly controlled type 2 diabetes mellitus with peripheral neuropathy ([place])   . Acute on chronic anemia   . History of below knee amputation, right ([place]) 12/31/2018  . S/P CABG x 3 06/28/2018  . Coronary artery disease involving native coronary artery of native heart with angina pectoris ([place]) 06/25/2018  . Type 2 diabetes mellitus with vascular disease ([place]) 06/24/2018  . Acute on chronic diastolic CHF (congestive heart failure) ([place]) 06/24/2018  . Non-ST elevation (NSTEMI) myocardial infarction ([place]) 06/24/2018  . Chest pain 06/23/2018  . Glaucoma 09/17/2017  . Diabetic retinopathy ([place]) 09/17/2017  . Otitis media 11/10/2016  . Decreased visual acuity 11/10/2016  . Spinal stenosis, lumbar region with neurogenic claudication 10/22/2016  . Other spondylosis with radiculopathy, lumbar region 10/13/2016  . Lipoma of abdominal wall 10/05/2016  . Severe protein-calorie malnutrition ([place]) 09/04/2016  . Anemia of chronic disease 09/04/2016  . Diabetic polyneuropathy associated with type 2 diabetes mellitus ([place]) 05/07/2016  .  Orthostatic hypotension 05/07/2016  . Cerebrovascular disease 04/27/2016  . N&V (nausea and vomiting) 04/01/2016  . ICH (intracerebral hemorrhage) ([place]) 03/30/2016  . Visit for preventive health examination 05/21/2015  . Breast cancer screening 05/21/2015  . Arm lesion 07/02/2014  . Pain in limb 01/13/2014  . Neuropathic pain of both legs 01/13/2014  . Hip pain 01/12/2014  . Allergic rhinitis 03/10/2013  . Back pain 03/08/2013  . Depression 12/18/2012  . PVD (peripheral vascular disease) ([place]) 01/21/2012  . Hyperlipidemia  12/06/2010  . Overweight(278.02) 12/06/2010  . RESTLESS LEG SYNDROME 10/25/2010  . Migraine without aura 10/07/2010  . Hereditary and idiopathic peripheral neuropathy 10/07/2010  . Essential hypertension, benign 10/07/2010  . DISTURBANCE OF SKIN SENSATION 10/07/2010  . HEART MURMUR, HX OF 10/07/2010  . NEPHROLITHIASIS, HX OF 10/07/2010   Past Medical History:  Diagnosis Date  . Abscess of great toe, right   . Acute on chronic diastolic CHF (congestive heart failure) ([place]) 06/24/2018  . Acute osteomyelitis of toe, left ([place]) 09/05/2016  . Amputation of right great toe ([place]) 12/22/2018  . Anxiety   . Cellulitis of foot, right 12/11/2018  . COMMON MIGRAINE 10/07/2010  . Decreased visual acuity 11/10/2016  . Depression 12/18/2012  . Diabetes mellitus type II, uncontrolled ([place]) 10/07/2010   Qualifier: Diagnosis of  By: Charlett Blake MD, [name]    . Diabetic foot infection ([place]) 08/26/2016  . Diabetic infection of right foot ([place])   . Disturbance of skin sensation 10/07/2010  . Gangrene of right foot ([place])   . Heart murmur   . History of kidney stones    "years ago"  . Hyperlipidemia 12/06/2010  . Hypertension   . Lipoma of abdominal wall 10/05/2016  . Overweight(278.02) 12/06/2010  . PERIPHERAL NEUROPATHY, FEET 10/07/2010  . PVD (peripheral vascular disease) ([place]) 01/21/2012  . RESTLESS LEG SYNDROME 10/25/2010  . Stroke [facility]) 2014, 2017   most recently in 2/17 - intracerebral hemorrhage    Family History  Problem Relation Age of Onset  . Arthritis Mother   . Stroke Brother        previous smoker  . Alcohol abuse Brother        in remission  . Leukemia Brother   . Diabetes Paternal Grandmother   . Healthy Son     Past Surgical History:  Procedure Laterality Date  . AMPUTATION Right 12/13/2018   Procedure: AMPUTATION RIGHT GREAT TOE, LOCAL RELOCATION OF TISSUE FOR WOUND CLOSURE 9cm x 3cm, VAC APPLICATION;  Surgeon: [name], MD;  Location: [facility];  Service: Orthopedics;  Laterality:  Right;  . AMPUTATION Right 12/31/2018   Procedure: RIGHT BELOW KNEE AMPUTATION;  Surgeon: [name], MD;  Location: [facility];  Service: Orthopedics;  Laterality: Right;  . AMPUTATION TOE Left 09/09/2016   Procedure: AMPUTATION OF LEFT GREAT TOE;  Surgeon: Milly Jakob, MD;  Location: [facility];  Service: Orthopedics;  Laterality: Left;  . CARDIAC CATHETERIZATION  06/24/2018  . CORONARY ARTERY BYPASS GRAFT N/A 06/28/2018   Procedure: CORONARY ARTERY BYPASS GRAFTING (CABG) times  four, using left internal mammary artery, endoscopically harvested right saphenous vein, and harvested left radial artery;  Surgeon: [name], MD;  Location: [facility];  Service: Open Heart Surgery;  Laterality: N/A;  . ENDOVEIN HARVEST OF GREATER SAPHENOUS VEIN Right 06/28/2018   Procedure: ENDOVEIN HARVEST OF GREATER SAPHENOUS VEIN;  Surgeon: [name], MD;  Location: [facility];  Service: Open Heart Surgery;  Laterality: Right;  . LEFT HEART CATH  AND CORONARY ANGIOGRAPHY N/A 06/24/2018   Procedure: LEFT HEART CATH AND CORONARY ANGIOGRAPHY;  Surgeon: Troy Sine, MD;  Location: [facility];  Service: Cardiovascular;  Laterality: N/A;  . RADIAL ARTERY HARVEST Left 06/28/2018   Procedure: RADIAL ARTERY HARVEST;  Surgeon: [name], MD;  Location: [facility];  Service: Open Heart Surgery;  Laterality: Left;  . TEE WITHOUT CARDIOVERSION N/A 06/28/2018   Procedure: TRANSESOPHAGEAL ECHOCARDIOGRAM (TEE);  Surgeon: [name], MD;  Location: [facility];  Service: Open Heart Surgery;  Laterality: N/A;  . WISDOM TOOTH EXTRACTION     Social History   Occupational History  . Occupation: Glass blower/designer  Tobacco Use  . Smoking status: Former Smoker    Packs/day: 0.50    Quit date: 09/08/2005    Years since quitting: 13.5  . Smokeless tobacco: Never Used  Substance and Sexual Activity  . Alcohol use: No    Alcohol/week: 0.0 standard drinks  . Drug use: Not Currently   . Sexual activity: Yes    Partners: Male    Birth control/protection: None

## 2019-03-23 ENCOUNTER — Ambulatory Visit: Payer: BC Managed Care – PPO | Admitting: Family

## 2019-03-23 ENCOUNTER — Encounter: Payer: BC Managed Care – PPO | Attending: Registered Nurse | Admitting: Physical Medicine & Rehabilitation

## 2019-03-25 DIAGNOSIS — Z4801 Encounter for change or removal of surgical wound dressing: Secondary | ICD-10-CM | POA: Diagnosis not present

## 2019-03-25 DIAGNOSIS — L89620 Pressure ulcer of left heel, unstageable: Secondary | ICD-10-CM | POA: Diagnosis not present

## 2019-03-30 ENCOUNTER — Ambulatory Visit (INDEPENDENT_AMBULATORY_CARE_PROVIDER_SITE_OTHER): Payer: BC Managed Care – PPO | Admitting: Family

## 2019-03-30 ENCOUNTER — Other Ambulatory Visit: Payer: Self-pay

## 2019-03-30 ENCOUNTER — Encounter: Payer: Self-pay | Admitting: Family

## 2019-03-30 VITALS — Ht 62.0 in | Wt 141.0 lb

## 2019-03-30 DIAGNOSIS — L89620 Pressure ulcer of left heel, unstageable: Secondary | ICD-10-CM

## 2019-03-30 DIAGNOSIS — Z89511 Acquired absence of right leg below knee: Secondary | ICD-10-CM

## 2019-03-30 NOTE — Progress Notes (Signed)
Post-Op Visit Note   Patient: [name]           Date of Birth: 1966/08/24           MRN: [id number] Visit Date: 03/30/2019 PCP: Mosie Lukes, MD  Chief Complaint:  Chief Complaint  Patient presents with  . Right Leg - Routine Post Op    12/31/18 right BKA    HPI:  HPI The patient is a 52 year old woman who presents today in follow-up for right below the knee amputation as well as a left heel decubitus ulcer.  She is status post right below the knee amputation April 24 of this year.  Has been quite slow to heal.  She has been applying Iodosorb dressings daily.  Has been doing dry dressings of her heel.  Is wearing a 5 compression shrinker as well  Please see attached photos of her wound. Ortho Exam On examination of the right below the knee amputation this looks much improved today there is less eschar.  There are islands of granulation tissue as well.  About 3 mm of circumferential epithelialization there is nearly 80% fibrinous tissue in the wound bed this was debrided along with eschar with a 10 blade knife back to viable bleeding tissue.  There is no gaping no drainage no surrounding erythema no odor no sign of infection. The left heel decubitus ulcer continues to be about 15 mm in diameter this is covered with eschar there is no surrounding erythema no drainage no odor is mildly tender to palpation. Visit Diagnoses:  1. Right below-knee amputee ([place])   2. Pressure injury of left heel, unstageable ([place])     Plan: Iodosorb dressing applied to her below the knee amputation today dry dressing to her heel she will continue daily Dial soap cleansing of both dry dressings to the left heel and medical compression shrinker on the right with direct skin contact.  She may weight-bear as tolerated on the left.  We will follow-up with her in 3 weeks she may return sooner should she develop eschar that she feels needs debrided.  Follow-Up Instructions: Return in about 3 weeks (around  04/20/2019).   Imaging: No results found.  Orders:  No orders of the defined types were placed in this encounter.  No orders of the defined types were placed in this encounter.    PMFS History: Patient Active Problem List   Diagnosis Date Noted  . Pressure injury of left heel, unstageable ([place]) 02/09/2019  . Hypoglycemia   . Diabetes mellitus type 2 in nonobese (HCC)   . Labile blood pressure   . Labile blood glucose   . Phantom limb pain ([place])   . Right below-knee amputee ([place]) 01/05/2019  . CKD (chronic kidney disease), stage III ([place])   . Hypothyroidism   . Essential hypertension   . Chronic diastolic congestive heart failure ([place])   . Coronary artery disease involving coronary bypass graft of native heart without angina pectoris   . Poorly controlled type 2 diabetes mellitus with peripheral neuropathy ([place])   . Acute on chronic anemia   . History of below knee amputation, right ([place]) 12/31/2018  . S/P CABG x 3 06/28/2018  . Coronary artery disease involving native coronary artery of native heart with angina pectoris ([place]) 06/25/2018  . Type 2 diabetes mellitus with vascular disease ([place]) 06/24/2018  . Acute on chronic diastolic CHF (congestive heart failure) ([place]) 06/24/2018  . Non-ST elevation (NSTEMI) myocardial infarction ([place]) 06/24/2018  . Chest  pain 06/23/2018  . Glaucoma 09/17/2017  . Diabetic retinopathy ([place]) 09/17/2017  . Otitis media 11/10/2016  . Decreased visual acuity 11/10/2016  . Spinal stenosis, lumbar region with neurogenic claudication 10/22/2016  . Other spondylosis with radiculopathy, lumbar region 10/13/2016  . Lipoma of abdominal wall 10/05/2016  . Severe protein-calorie malnutrition ([place]) 09/04/2016  . Anemia of chronic disease 09/04/2016  . Diabetic polyneuropathy associated with type 2 diabetes mellitus ([place]) 05/07/2016  . Orthostatic hypotension 05/07/2016  . Cerebrovascular disease 04/27/2016  . N&V (nausea and vomiting) 04/01/2016  .  ICH (intracerebral hemorrhage) ([place]) 03/30/2016  . Visit for preventive health examination 05/21/2015  . Breast cancer screening 05/21/2015  . Arm lesion 07/02/2014  . Pain in limb 01/13/2014  . Neuropathic pain of both legs 01/13/2014  . Hip pain 01/12/2014  . Allergic rhinitis 03/10/2013  . Back pain 03/08/2013  . Depression 12/18/2012  . PVD (peripheral vascular disease) ([place]) 01/21/2012  . Hyperlipidemia 12/06/2010  . Overweight(278.02) 12/06/2010  . RESTLESS LEG SYNDROME 10/25/2010  . Migraine without aura 10/07/2010  . Hereditary and idiopathic peripheral neuropathy 10/07/2010  . Essential hypertension, benign 10/07/2010  . DISTURBANCE OF SKIN SENSATION 10/07/2010  . HEART MURMUR, HX OF 10/07/2010  . NEPHROLITHIASIS, HX OF 10/07/2010   Past Medical History:  Diagnosis Date  . Abscess of great toe, right   . Acute on chronic diastolic CHF (congestive heart failure) ([place]) 06/24/2018  . Acute osteomyelitis of toe, left ([place]) 09/05/2016  . Amputation of right great toe ([place]) 12/22/2018  . Anxiety   . Cellulitis of foot, right 12/11/2018  . COMMON MIGRAINE 10/07/2010  . Decreased visual acuity 11/10/2016  . Depression 12/18/2012  . Diabetes mellitus type II, uncontrolled ([place]) 10/07/2010   Qualifier: Diagnosis of  By: Charlett Blake MD, [name]    . Diabetic foot infection ([place]) 08/26/2016  . Diabetic infection of right foot ([place])   . Disturbance of skin sensation 10/07/2010  . Gangrene of right foot ([place])   . Heart murmur   . History of kidney stones    "years ago"  . Hyperlipidemia 12/06/2010  . Hypertension   . Lipoma of abdominal wall 10/05/2016  . Overweight(278.02) 12/06/2010  . PERIPHERAL NEUROPATHY, FEET 10/07/2010  . PVD (peripheral vascular disease) ([place]) 01/21/2012  . RESTLESS LEG SYNDROME 10/25/2010  . Stroke [facility]) 2014, 2017   most recently in 2/17 - intracerebral hemorrhage    Family History  Problem Relation Age of Onset  . Arthritis Mother   . Stroke Brother         previous smoker  . Alcohol abuse Brother        in remission  . Leukemia Brother   . Diabetes Paternal Grandmother   . Healthy Son     Past Surgical History:  Procedure Laterality Date  . AMPUTATION Right 12/13/2018   Procedure: AMPUTATION RIGHT GREAT TOE, LOCAL RELOCATION OF TISSUE FOR WOUND CLOSURE 9cm x 3cm, VAC APPLICATION;  Surgeon: [name], MD;  Location: [place];  Service: Orthopedics;  Laterality: Right;  . AMPUTATION Right 12/31/2018   Procedure: RIGHT BELOW KNEE AMPUTATION;  Surgeon: [name], MD;  Location: [place];  Service: Orthopedics;  Laterality: Right;  . AMPUTATION TOE Left 09/09/2016   Procedure: AMPUTATION OF LEFT GREAT TOE;  Surgeon: Milly Jakob, MD;  Location: [place];  Service: Orthopedics;  Laterality: Left;  . CARDIAC CATHETERIZATION  06/24/2018  . CORONARY ARTERY BYPASS GRAFT N/A 06/28/2018   Procedure: CORONARY ARTERY BYPASS GRAFTING (CABG) times  four, using left internal mammary artery, endoscopically harvested right saphenous vein, and harvested left radial artery;  Surgeon: [name], MD;  Location: [place];  Service: Open Heart Surgery;  Laterality: N/A;  . ENDOVEIN HARVEST OF GREATER SAPHENOUS VEIN Right 06/28/2018   Procedure: ENDOVEIN HARVEST OF GREATER SAPHENOUS VEIN;  Surgeon: [name], MD;  Location: [place];  Service: Open Heart Surgery;  Laterality: Right;  . LEFT HEART CATH AND CORONARY ANGIOGRAPHY N/A 06/24/2018   Procedure: LEFT HEART CATH AND CORONARY ANGIOGRAPHY;  Surgeon: Troy Sine, MD;  Location: [place] CV LAB;  Service: Cardiovascular;  Laterality: N/A;  . RADIAL ARTERY HARVEST Left 06/28/2018   Procedure: RADIAL ARTERY HARVEST;  Surgeon: [name], MD;  Location: [place];  Service: Open Heart Surgery;  Laterality: Left;  . TEE WITHOUT CARDIOVERSION N/A 06/28/2018   Procedure: TRANSESOPHAGEAL ECHOCARDIOGRAM (TEE);  Surgeon: [name], MD;  Location: [place];  Service:  Open Heart Surgery;  Laterality: N/A;  . WISDOM TOOTH EXTRACTION     Social History   Occupational History  . Occupation: Glass blower/designer  Tobacco Use  . Smoking status: Former Smoker    Packs/day: 0.50    Quit date: 09/08/2005    Years since quitting: 13.5  . Smokeless tobacco: Never Used  Substance and Sexual Activity  . Alcohol use: No    Alcohol/week: 0.0 standard drinks  . Drug use: Not Currently  . Sexual activity: Yes    Partners: Male    Birth control/protection: None

## 2019-04-07 ENCOUNTER — Other Ambulatory Visit: Payer: Self-pay | Admitting: Family

## 2019-04-07 ENCOUNTER — Telehealth: Payer: Self-pay

## 2019-04-07 NOTE — Telephone Encounter (Signed)
This is a BKA and ulcer on the left foot. Does she need to continue ABX?

## 2019-04-07 NOTE — Telephone Encounter (Signed)
Called pt and advised pharm had faxed refill request for doxy. The pt states that she is not having any problems and did not call for a refill request. Advised that I will deny rx and she is to follow up on 04/13/19. To call with any questions.

## 2019-04-07 NOTE — Telephone Encounter (Signed)
Lets see her if there is  A concern for infection

## 2019-04-13 ENCOUNTER — Ambulatory Visit: Payer: BC Managed Care – PPO | Admitting: Family

## 2019-04-16 DIAGNOSIS — I5032 Chronic diastolic (congestive) heart failure: Secondary | ICD-10-CM | POA: Diagnosis not present

## 2019-04-16 DIAGNOSIS — S88111A Complete traumatic amputation at level between knee and ankle, right lower leg, initial encounter: Secondary | ICD-10-CM | POA: Diagnosis not present

## 2019-04-19 ENCOUNTER — Ambulatory Visit (INDEPENDENT_AMBULATORY_CARE_PROVIDER_SITE_OTHER): Payer: BC Managed Care – PPO | Admitting: Family

## 2019-04-19 ENCOUNTER — Encounter: Payer: Self-pay | Admitting: Family

## 2019-04-19 VITALS — Ht 62.0 in | Wt 141.0 lb

## 2019-04-19 DIAGNOSIS — Z89511 Acquired absence of right leg below knee: Secondary | ICD-10-CM

## 2019-04-19 DIAGNOSIS — E1159 Type 2 diabetes mellitus with other circulatory complications: Secondary | ICD-10-CM | POA: Diagnosis not present

## 2019-04-19 DIAGNOSIS — G5793 Unspecified mononeuropathy of bilateral lower limbs: Secondary | ICD-10-CM

## 2019-04-19 NOTE — Progress Notes (Signed)
Post-Op Visit Note   Patient: [name]           Date of Birth: 1966/03/04           MRN: [id number] Visit Date: 04/19/2019 PCP: Mosie Lukes, MD  Chief Complaint:  Chief Complaint  Patient presents with  . Right Leg - Follow-up    12/31/18 right BKA   . Left Foot - Follow-up    Heel ulcer     HPI:  HPI The patient is a 53 year old woman who is seen today for follow-up for right below the knee amputation which is been slow to heal.  She also has a left heel decubitus ulcer.  She has been doing well daily cleansing of her wounds and wearing her shrinker with direct skin contact on the right.  Dry dressings to the left heel.    Ortho Exam On examination of the right below the knee amputation tissue in the incision much improved there is significantly less fibrinous tissue.  There is no eschar today.  There is granulation circumferentially.  There is no active drainage no erythema no odor was debrided with a 10 blade knife and nonviable tissue.    The left heel ulcer is 1 cm in diameter covered with eschar.  There is surrounding callus which was debrided.  There is no erythema no odor no sign of infection   Visit Diagnoses:  1. History of below knee amputation, right ([place])   2. Neuropathic pain of both legs   3. Type 2 diabetes mellitus with vascular disease ([place])     Plan: Iodosorb dressing applied to her below the knee amputation today dry dressing to her heel she will continue daily Dial soap cleansing of both dry dressings to the left heel and medical compression shrinker on the right with direct skin contact.  She may weight-bear as tolerated on the left.  We will follow-up with her in 3 weeks she may return sooner should she develop eschar that she feels needs debrided.  Follow-Up Instructions: Return in about 2 weeks (around 05/03/2019).   Imaging: No results found.  Orders:  No orders of the defined types were placed in this encounter.  No orders of the  defined types were placed in this encounter.    PMFS History: Patient Active Problem List   Diagnosis Date Noted  . Pressure injury of left heel, unstageable ([place]) 02/09/2019  . Hypoglycemia   . Diabetes mellitus type 2 in nonobese (HCC)   . Labile blood pressure   . Labile blood glucose   . Phantom limb pain ([place])   . CKD (chronic kidney disease), stage III ([place])   . Hypothyroidism   . Essential hypertension   . Chronic diastolic congestive heart failure ([place])   . Coronary artery disease involving coronary bypass graft of native heart without angina pectoris   . Poorly controlled type 2 diabetes mellitus with peripheral neuropathy ([place])   . Acute on chronic anemia   . History of below knee amputation, right ([place]) 12/31/2018  . S/P CABG x 3 06/28/2018  . Coronary artery disease involving native coronary artery of native heart with angina pectoris ([place]) 06/25/2018  . Type 2 diabetes mellitus with vascular disease ([place]) 06/24/2018  . Acute on chronic diastolic CHF (congestive heart failure) ([place]) 06/24/2018  . Non-ST elevation (NSTEMI) myocardial infarction ([place]) 06/24/2018  . Chest pain 06/23/2018  . Glaucoma 09/17/2017  . Diabetic retinopathy ([place]) 09/17/2017  . Otitis media 11/10/2016  . Decreased  visual acuity 11/10/2016  . Spinal stenosis, lumbar region with neurogenic claudication 10/22/2016  . Other spondylosis with radiculopathy, lumbar region 10/13/2016  . Lipoma of abdominal wall 10/05/2016  . Severe protein-calorie malnutrition ([place]) 09/04/2016  . Anemia of chronic disease 09/04/2016  . Diabetic polyneuropathy associated with type 2 diabetes mellitus ([place]) 05/07/2016  . Orthostatic hypotension 05/07/2016  . Cerebrovascular disease 04/27/2016  . N&V (nausea and vomiting) 04/01/2016  . ICH (intracerebral hemorrhage) ([place]) 03/30/2016  . Visit for preventive health examination 05/21/2015  . Breast cancer screening 05/21/2015  . Arm lesion 07/02/2014  . Pain in limb  01/13/2014  . Neuropathic pain of both legs 01/13/2014  . Hip pain 01/12/2014  . Allergic rhinitis 03/10/2013  . Back pain 03/08/2013  . Depression 12/18/2012  . PVD (peripheral vascular disease) ([place]) 01/21/2012  . Hyperlipidemia 12/06/2010  . Overweight(278.02) 12/06/2010  . RESTLESS LEG SYNDROME 10/25/2010  . Migraine without aura 10/07/2010  . Hereditary and idiopathic peripheral neuropathy 10/07/2010  . Essential hypertension, benign 10/07/2010  . DISTURBANCE OF SKIN SENSATION 10/07/2010  . HEART MURMUR, HX OF 10/07/2010  . NEPHROLITHIASIS, HX OF 10/07/2010   Past Medical History:  Diagnosis Date  . Abscess of great toe, right   . Acute on chronic diastolic CHF (congestive heart failure) ([place]) 06/24/2018  . Acute osteomyelitis of toe, left ([place]) 09/05/2016  . Amputation of right great toe ([place]) 12/22/2018  . Anxiety   . Cellulitis of foot, right 12/11/2018  . COMMON MIGRAINE 10/07/2010  . Decreased visual acuity 11/10/2016  . Depression 12/18/2012  . Diabetes mellitus type II, uncontrolled ([place]) 10/07/2010   Qualifier: Diagnosis of  By: Charlett Blake MD, [name]    . Diabetic foot infection ([place]) 08/26/2016  . Diabetic infection of right foot ([place])   . Disturbance of skin sensation 10/07/2010  . Gangrene of right foot ([place])   . Heart murmur   . History of kidney stones    "years ago"  . Hyperlipidemia 12/06/2010  . Hypertension   . Lipoma of abdominal wall 10/05/2016  . Overweight(278.02) 12/06/2010  . PERIPHERAL NEUROPATHY, FEET 10/07/2010  . PVD (peripheral vascular disease) ([place]) 01/21/2012  . RESTLESS LEG SYNDROME 10/25/2010  . Stroke [facility]) 2014, 2017   most recently in 2/17 - intracerebral hemorrhage    Family History  Problem Relation Age of Onset  . Arthritis Mother   . Stroke Brother        previous smoker  . Alcohol abuse Brother        in remission  . Leukemia Brother   . Diabetes Paternal Grandmother   . Healthy Son     Past Surgical History:  Procedure Laterality  Date  . AMPUTATION Right 12/13/2018   Procedure: AMPUTATION RIGHT GREAT TOE, LOCAL RELOCATION OF TISSUE FOR WOUND CLOSURE 9cm x 3cm, VAC APPLICATION;  Surgeon: [name], MD;  Location: [place];  Service: Orthopedics;  Laterality: Right;  . AMPUTATION Right 12/31/2018   Procedure: RIGHT BELOW KNEE AMPUTATION;  Surgeon: [name], MD;  Location: [place];  Service: Orthopedics;  Laterality: Right;  . AMPUTATION TOE Left 09/09/2016   Procedure: AMPUTATION OF LEFT GREAT TOE;  Surgeon: Milly Jakob, MD;  Location: [place];  Service: Orthopedics;  Laterality: Left;  . CARDIAC CATHETERIZATION  06/24/2018  . CORONARY ARTERY BYPASS GRAFT N/A 06/28/2018   Procedure: CORONARY ARTERY BYPASS GRAFTING (CABG) times  four, using left internal mammary artery, endoscopically harvested right saphenous vein, and harvested left radial artery;  Surgeon: Roxan Hockey,  Revonda Standard, MD;  Location: [place];  Service: Open Heart Surgery;  Laterality: N/A;  . ENDOVEIN HARVEST OF GREATER SAPHENOUS VEIN Right 06/28/2018   Procedure: ENDOVEIN HARVEST OF GREATER SAPHENOUS VEIN;  Surgeon: [name], MD;  Location: [place];  Service: Open Heart Surgery;  Laterality: Right;  . LEFT HEART CATH AND CORONARY ANGIOGRAPHY N/A 06/24/2018   Procedure: LEFT HEART CATH AND CORONARY ANGIOGRAPHY;  Surgeon: Troy Sine, MD;  Location: [place] CV [facility];  Service: Cardiovascular;  Laterality: N/A;  . RADIAL ARTERY HARVEST Left 06/28/2018   Procedure: RADIAL ARTERY HARVEST;  Surgeon: [name], MD;  Location: [place];  Service: Open Heart Surgery;  Laterality: Left;  . TEE WITHOUT CARDIOVERSION N/A 06/28/2018   Procedure: TRANSESOPHAGEAL ECHOCARDIOGRAM (TEE);  Surgeon: [name], MD;  Location: [place];  Service: Open Heart Surgery;  Laterality: N/A;  . WISDOM TOOTH EXTRACTION     Social History   Occupational History  . Occupation: Glass blower/designer  Tobacco Use  . Smoking status: Former  Smoker    Packs/day: 0.50    Quit date: 09/08/2005    Years since quitting: 13.6  . Smokeless tobacco: Never Used  Substance and Sexual Activity  . Alcohol use: No    Alcohol/week: 0.0 standard drinks  . Drug use: Not Currently  . Sexual activity: Yes    Partners: Male    Birth control/protection: None

## 2019-04-27 ENCOUNTER — Telehealth: Payer: Self-pay | Admitting: Family

## 2019-04-27 NOTE — Telephone Encounter (Signed)
refaxed 8/11 ov note to [facility] [phone]

## 2019-04-28 ENCOUNTER — Telehealth: Payer: Self-pay

## 2019-04-28 NOTE — Telephone Encounter (Signed)
Pt called and lm on vm to advise that [facility] is missing paperwork that was completed and faxed last week. Do you still have this. I think we faxed it twice.

## 2019-04-29 NOTE — Telephone Encounter (Signed)
Patient called to check on the status of her paperwork being faxed to [facility].  CB#[phone].  Thank you.

## 2019-05-02 NOTE — Telephone Encounter (Signed)
IC,LMVm advised I have faxed to [facility]. To call with an alternate fax number.

## 2019-05-03 ENCOUNTER — Ambulatory Visit: Payer: BC Managed Care – PPO | Admitting: Family

## 2019-05-04 ENCOUNTER — Ambulatory Visit (INDEPENDENT_AMBULATORY_CARE_PROVIDER_SITE_OTHER): Payer: BC Managed Care – PPO | Admitting: Family

## 2019-05-04 ENCOUNTER — Encounter: Payer: Self-pay | Admitting: Family

## 2019-05-04 VITALS — Ht 62.0 in | Wt 141.0 lb

## 2019-05-04 DIAGNOSIS — L89620 Pressure ulcer of left heel, unstageable: Secondary | ICD-10-CM | POA: Diagnosis not present

## 2019-05-04 DIAGNOSIS — Z89511 Acquired absence of right leg below knee: Secondary | ICD-10-CM | POA: Diagnosis not present

## 2019-05-04 NOTE — Progress Notes (Signed)
Post-Op Visit Note   Patient: [name]           Date of Birth: 12/19/1965           MRN: [id number] Visit Date: 05/04/2019 PCP: Mosie Lukes, MD  Chief Complaint:  Chief Complaint  Patient presents with  . Right Leg - Follow-up    12/31/18 right BKA     HPI:  HPI The patient is a 53 year old woman who is seen today for follow-up for right below the knee amputation which is been slow to heal.  She also has a left heel decubitus ulcer.  She has been doing daily cleansing of her wounds and wearing her shrinker with direct skin contact on the right.  Dry dressings to the left heel.    Ortho Exam On examination of the right below the knee amputation tissue in the ulcer is much improved there is significantly less fibrinous tissue.  There is no eschar today.  Laterally ulcer has healed. There is granulation circumferentially.  There is no active drainage no erythema no odor was debrided with gauze.  The left heel ulcer is 1 cm in diameter covered with eschar, no with some fibrinous tissue circumferentially, is moist. There is no erythema no odor no sign of infection   Visit Diagnoses:  No diagnosis found.  Plan: Iodosorb dressing applied to her below the knee amputation today dry dressing and to her heel. she will continue daily Dial soap cleansing of both.iodosorb dressings to the left heel and medical compression shrinker on the right with direct skin contact.  She may weight-bear as tolerated on the left.  We will follow-up with her in 4 weeks she may return sooner should she develop eschar that she feels needs debrided.  Follow-Up Instructions: Return in about 4 weeks (around 06/01/2019).   Imaging: No results found.  Orders:  No orders of the defined types were placed in this encounter.  No orders of the defined types were placed in this encounter.    PMFS History: Patient Active Problem List   Diagnosis Date Noted  . Pressure injury of left heel, unstageable  ([place]) 02/09/2019  . Hypoglycemia   . Diabetes mellitus type 2 in nonobese (HCC)   . Labile blood pressure   . Labile blood glucose   . Phantom limb pain ([place])   . CKD (chronic kidney disease), stage III ([place])   . Hypothyroidism   . Essential hypertension   . Chronic diastolic congestive heart failure ([place])   . Coronary artery disease involving coronary bypass graft of native heart without angina pectoris   . Poorly controlled type 2 diabetes mellitus with peripheral neuropathy ([place])   . Acute on chronic anemia   . History of below knee amputation, right ([place]) 12/31/2018  . S/P CABG x 3 06/28/2018  . Coronary artery disease involving native coronary artery of native heart with angina pectoris ([place]) 06/25/2018  . Type 2 diabetes mellitus with vascular disease ([place]) 06/24/2018  . Acute on chronic diastolic CHF (congestive heart failure) ([place]) 06/24/2018  . Non-ST elevation (NSTEMI) myocardial infarction ([place]) 06/24/2018  . Chest pain 06/23/2018  . Glaucoma 09/17/2017  . Diabetic retinopathy ([place]) 09/17/2017  . Otitis media 11/10/2016  . Decreased visual acuity 11/10/2016  . Spinal stenosis, lumbar region with neurogenic claudication 10/22/2016  . Other spondylosis with radiculopathy, lumbar region 10/13/2016  . Lipoma of abdominal wall 10/05/2016  . Severe protein-calorie malnutrition ([place]) 09/04/2016  . Anemia of chronic disease 09/04/2016  .  Diabetic polyneuropathy associated with type 2 diabetes mellitus ([place]) 05/07/2016  . Orthostatic hypotension 05/07/2016  . Cerebrovascular disease 04/27/2016  . N&V (nausea and vomiting) 04/01/2016  . ICH (intracerebral hemorrhage) ([place]) 03/30/2016  . Visit for preventive health examination 05/21/2015  . Breast cancer screening 05/21/2015  . Arm lesion 07/02/2014  . Pain in limb 01/13/2014  . Neuropathic pain of both legs 01/13/2014  . Hip pain 01/12/2014  . Allergic rhinitis 03/10/2013  . Back pain 03/08/2013  . Depression 12/18/2012  .  PVD (peripheral vascular disease) ([place]) 01/21/2012  . Hyperlipidemia 12/06/2010  . Overweight(278.02) 12/06/2010  . RESTLESS LEG SYNDROME 10/25/2010  . Migraine without aura 10/07/2010  . Hereditary and idiopathic peripheral neuropathy 10/07/2010  . Essential hypertension, benign 10/07/2010  . DISTURBANCE OF SKIN SENSATION 10/07/2010  . HEART MURMUR, HX OF 10/07/2010  . NEPHROLITHIASIS, HX OF 10/07/2010   Past Medical History:  Diagnosis Date  . Abscess of great toe, right   . Acute on chronic diastolic CHF (congestive heart failure) ([place]) 06/24/2018  . Acute osteomyelitis of toe, left ([place]) 09/05/2016  . Amputation of right great toe ([place]) 12/22/2018  . Anxiety   . Cellulitis of foot, right 12/11/2018  . COMMON MIGRAINE 10/07/2010  . Decreased visual acuity 11/10/2016  . Depression 12/18/2012  . Diabetes mellitus type II, uncontrolled ([place]) 10/07/2010   Qualifier: Diagnosis of  By: Charlett Blake MD, [name]    . Diabetic foot infection ([place]) 08/26/2016  . Diabetic infection of right foot ([place])   . Disturbance of skin sensation 10/07/2010  . Gangrene of right foot ([place])   . Heart murmur   . History of kidney stones    "years ago"  . Hyperlipidemia 12/06/2010  . Hypertension   . Lipoma of abdominal wall 10/05/2016  . Overweight(278.02) 12/06/2010  . PERIPHERAL NEUROPATHY, FEET 10/07/2010  . PVD (peripheral vascular disease) ([place]) 01/21/2012  . RESTLESS LEG SYNDROME 10/25/2010  . Stroke [facility]) 2014, 2017   most recently in 2/17 - intracerebral hemorrhage    Family History  Problem Relation Age of Onset  . Arthritis Mother   . Stroke Brother        previous smoker  . Alcohol abuse Brother        in remission  . Leukemia Brother   . Diabetes Paternal Grandmother   . Healthy Son     Past Surgical History:  Procedure Laterality Date  . AMPUTATION Right 12/13/2018   Procedure: AMPUTATION RIGHT GREAT TOE, LOCAL RELOCATION OF TISSUE FOR WOUND CLOSURE 9cm x 3cm, VAC APPLICATION;  Surgeon: [name], MD;  Location: [place];  Service: Orthopedics;  Laterality: Right;  . AMPUTATION Right 12/31/2018   Procedure: RIGHT BELOW KNEE AMPUTATION;  Surgeon: [name], MD;  Location: [place];  Service: Orthopedics;  Laterality: Right;  . AMPUTATION TOE Left 09/09/2016   Procedure: AMPUTATION OF LEFT GREAT TOE;  Surgeon: Milly Jakob, MD;  Location: [name];  Service: Orthopedics;  Laterality: Left;  . CARDIAC CATHETERIZATION  06/24/2018  . CORONARY ARTERY BYPASS GRAFT N/A 06/28/2018   Procedure: CORONARY ARTERY BYPASS GRAFTING (CABG) times  four, using left internal mammary artery, endoscopically harvested right saphenous vein, and harvested left radial artery;  Surgeon: [name], MD;  Location: [place];  Service: Open Heart Surgery;  Laterality: N/A;  . ENDOVEIN HARVEST OF GREATER SAPHENOUS VEIN Right 06/28/2018   Procedure: ENDOVEIN HARVEST OF GREATER SAPHENOUS VEIN;  Surgeon: [name], MD;  Location: [place];  Service: Open Heart Surgery;  Laterality: Right;  . LEFT HEART CATH AND CORONARY ANGIOGRAPHY N/A 06/24/2018   Procedure: LEFT HEART CATH AND CORONARY ANGIOGRAPHY;  Surgeon: Troy Sine, MD;  Location: [place] [facility];  Service: Cardiovascular;  Laterality: N/A;  . RADIAL ARTERY HARVEST Left 06/28/2018   Procedure: RADIAL ARTERY HARVEST;  Surgeon: [name], MD;  Location: [place];  Service: Open Heart Surgery;  Laterality: Left;  . TEE WITHOUT CARDIOVERSION N/A 06/28/2018   Procedure: TRANSESOPHAGEAL ECHOCARDIOGRAM (TEE);  Surgeon: [name], MD;  Location: [place];  Service: Open Heart Surgery;  Laterality: N/A;  . WISDOM TOOTH EXTRACTION     Social History   Occupational History  . Occupation: Glass blower/designer  Tobacco Use  . Smoking status: Former Smoker    Packs/day: 0.50    Quit date: 09/08/2005    Years since quitting: 13.6  . Smokeless tobacco: Never Used  Substance and Sexual Activity  . Alcohol use: No     Alcohol/week: 0.0 standard drinks  . Drug use: Not Currently  . Sexual activity: Yes    Partners: Male    Birth control/protection: None

## 2019-05-05 ENCOUNTER — Telehealth: Payer: Self-pay

## 2019-05-05 NOTE — Telephone Encounter (Signed)
Note for yesterdays office visit faxed to [facility] per pt request fax # [phone]

## 2019-05-11 DIAGNOSIS — L89620 Pressure ulcer of left heel, unstageable: Secondary | ICD-10-CM | POA: Diagnosis not present

## 2019-05-11 DIAGNOSIS — Z4801 Encounter for change or removal of surgical wound dressing: Secondary | ICD-10-CM | POA: Diagnosis not present

## 2019-05-12 DIAGNOSIS — L89620 Pressure ulcer of left heel, unstageable: Secondary | ICD-10-CM | POA: Diagnosis not present

## 2019-05-12 DIAGNOSIS — Z4801 Encounter for change or removal of surgical wound dressing: Secondary | ICD-10-CM | POA: Diagnosis not present

## 2019-05-13 DIAGNOSIS — L89620 Pressure ulcer of left heel, unstageable: Secondary | ICD-10-CM | POA: Diagnosis not present

## 2019-05-13 DIAGNOSIS — Z4801 Encounter for change or removal of surgical wound dressing: Secondary | ICD-10-CM | POA: Diagnosis not present

## 2019-05-17 DIAGNOSIS — I5032 Chronic diastolic (congestive) heart failure: Secondary | ICD-10-CM | POA: Diagnosis not present

## 2019-05-17 DIAGNOSIS — S88111A Complete traumatic amputation at level between knee and ankle, right lower leg, initial encounter: Secondary | ICD-10-CM | POA: Diagnosis not present

## 2019-05-19 ENCOUNTER — Ambulatory Visit: Payer: BC Managed Care – PPO | Admitting: Orthopedic Surgery

## 2019-05-25 ENCOUNTER — Other Ambulatory Visit: Payer: Self-pay

## 2019-05-25 DIAGNOSIS — L89899 Pressure ulcer of other site, unspecified stage: Secondary | ICD-10-CM

## 2019-05-26 ENCOUNTER — Ambulatory Visit (INDEPENDENT_AMBULATORY_CARE_PROVIDER_SITE_OTHER): Payer: BC Managed Care – PPO | Admitting: Orthopedic Surgery

## 2019-05-26 ENCOUNTER — Ambulatory Visit (HOSPITAL_COMMUNITY): Payer: BC Managed Care – PPO | Attending: Family Medicine

## 2019-05-26 ENCOUNTER — Encounter: Payer: Self-pay | Admitting: Orthopedic Surgery

## 2019-05-26 ENCOUNTER — Encounter: Payer: BC Managed Care – PPO | Admitting: Vascular Surgery

## 2019-05-26 VITALS — Ht 62.0 in | Wt 141.0 lb

## 2019-05-26 DIAGNOSIS — E1159 Type 2 diabetes mellitus with other circulatory complications: Secondary | ICD-10-CM | POA: Diagnosis not present

## 2019-05-26 DIAGNOSIS — G5793 Unspecified mononeuropathy of bilateral lower limbs: Secondary | ICD-10-CM

## 2019-05-26 DIAGNOSIS — Z89511 Acquired absence of right leg below knee: Secondary | ICD-10-CM | POA: Diagnosis not present

## 2019-05-26 DIAGNOSIS — L89620 Pressure ulcer of left heel, unstageable: Secondary | ICD-10-CM

## 2019-05-31 ENCOUNTER — Encounter: Payer: Self-pay | Admitting: Orthopedic Surgery

## 2019-05-31 NOTE — Progress Notes (Signed)
Office Visit Note   Patient: [name]           Date of Birth: 1965/11/16           MRN: [id number] Visit Date: 05/26/2019              Requested by: Mosie Lukes, MD [address] [place],  [place] [id number] PCP: Mosie Lukes, MD  Chief Complaint  Patient presents with   Right Leg - Follow-up    12/31/18 right BKA    Left Foot - Follow-up      HPI: Patient is a 53 year old woman who presents for 2 separate issues #1 she is 5 months status post right transtibial amputation still has a small wound.  #2 she has a left heel ulcer currently using Iodosorb dressing changes she is in regular shoewear.  Assessment & Plan: Visit Diagnoses: No diagnosis found.  Plan: Patient will follow-up with [facility] for the prosthetic fitting.  The left heel ulcer was debrided of skin and soft tissue.  Continue wound care left heel.  Follow-Up Instructions: Return in about 4 weeks (around 06/23/2019).   Ortho Exam  Patient is alert, oriented, no adenopathy, well-dressed, normal affect, normal respiratory effort. Examination patient has a well consolidated right transtibial amputation.  She does have 1  wound that is 2 x 7 cm x 1 mm deep.  Examination of the left heel she does have a decubitus ulcer which is 10 mm in diameter 1 mm deep.  No redness no cellulitis no signs of infection.  Imaging: No results found. No images are attached to the encounter.  Labs: Lab Results  Component Value Date   HGBA1C 10.5 (H) 12/12/2018   HGBA1C 11.1 (H) 06/26/2018   HGBA1C 9.5 (H) 11/17/2017   ESRSEDRATE 130 (H) 12/12/2018   ESRSEDRATE 115 (H) 06/24/2018   ESRSEDRATE 107 (H) 09/04/2016   CRP 28.2 (H) 12/12/2018   CRP 7.2 (H) 09/04/2016   CRP 0.7 03/21/2015   LABURIC 3.2 06/13/2014   LABURIC 3.1 02/09/2014   REPTSTATUS 12/18/2018 FINAL 12/13/2018   GRAMSTAIN  12/13/2018    FEW WBC PRESENT,BOTH PMN AND MONONUCLEAR NO ORGANISMS SEEN    CULT  12/13/2018    FEW  STAPHYLOCOCCUS SIMULANS CRITICAL RESULT CALLED TO, READ BACK BY AND VERIFIED WITH: RN N FELIX [id number] AT 33 AM BY CM NO ANAEROBES ISOLATED Performed at [facility], [place] [address], [place], [place] [address]    LABORGA STAPHYLOCOCCUS SIMULANS 12/13/2018     Lab Results  Component Value Date   ALBUMIN 1.8 (L) 01/06/2019   ALBUMIN 2.4 (L) 12/11/2018   ALBUMIN 3.4 (L) 07/20/2018   PREALBUMIN 12.7 (L) 09/04/2016   LABURIC 3.2 06/13/2014   LABURIC 3.1 02/09/2014    Lab Results  Component Value Date   MG 2.1 12/16/2018   MG 2.8 (H) 06/29/2018   MG 3.0 (H) 06/29/2018   No results found for: [facility]  Lab Results  Component Value Date   PREALBUMIN 12.7 (L) 09/04/2016   CBC EXTENDED Latest Ref Rng & Units 01/14/2019 01/10/2019 01/06/2019  WBC 4.0 - 10.5 K/uL 9.1 12.1(H) 10.1  RBC 3.87 - 5.11 MIL/uL 3.34(L) 3.59(L) 3.24(L)  HGB 12.0 - 15.0 g/dL 8.8(L) 9.4(L) 8.7(L)  HCT 36.0 - 46.0 % 27.8(L) 29.7(L) 26.6(L)  PLT 150 - 400 K/uL 613(H) 699(H) 566(H)  NEUTROABS 1.7 - 7.7 K/uL - 8.7(H) 7.0  LYMPHSABS 0.7 - 4.0 K/uL - 2.0 1.5  Body mass index is 25.79 kg/m.  Orders:  No orders of the defined types were placed in this encounter.  No orders of the defined types were placed in this encounter.    Procedures: No procedures performed  Clinical Data: No additional findings.  ROS:  All other systems negative, except as noted in the HPI. Review of Systems  Objective: Vital Signs: Ht 5\' 2"  (1.575 m)    Wt 141 lb (64 kg)    LMP 02/12/2011 (Exact Date)    BMI 25.79 kg/m   Specialty Comments:  No specialty comments available.  PMFS History: Patient Active Problem List   Diagnosis Date Noted   Pressure injury of left heel, unstageable ([place]) 02/09/2019   Hypoglycemia    Diabetes mellitus type 2 in nonobese ([place])    Labile blood pressure    Labile blood glucose    Phantom limb pain (HCC)    CKD (chronic kidney disease), stage III (HCC)    Hypothyroidism     Essential hypertension    Chronic diastolic congestive heart failure ([place])    Coronary artery disease involving coronary bypass graft of native heart without angina pectoris    Poorly controlled type 2 diabetes mellitus with peripheral neuropathy (HCC)    Acute on chronic anemia    History of below knee amputation, right ([place]) 12/31/2018   S/P CABG x 3 06/28/2018   Coronary artery disease involving native coronary artery of native heart with angina pectoris ([place]) 06/25/2018   Type 2 diabetes mellitus with vascular disease ([place]) 06/24/2018   Acute on chronic diastolic CHF (congestive heart failure) ([place]) 06/24/2018   Non-ST elevation (NSTEMI) myocardial infarction ([place]) 06/24/2018   Chest pain 06/23/2018   Glaucoma 09/17/2017   Diabetic retinopathy ([place]) 09/17/2017   Otitis media 11/10/2016   Decreased visual acuity 11/10/2016   Spinal stenosis, lumbar region with neurogenic claudication 10/22/2016   Other spondylosis with radiculopathy, lumbar region 10/13/2016   Lipoma of abdominal wall 10/05/2016   Severe protein-calorie malnutrition ([place]) 09/04/2016   Anemia of chronic disease 09/04/2016   Diabetic polyneuropathy associated with type 2 diabetes mellitus ([place]) 05/07/2016   Orthostatic hypotension 05/07/2016   Cerebrovascular disease 04/27/2016   N&V (nausea and vomiting) 04/01/2016   ICH (intracerebral hemorrhage) ([place]) 03/30/2016   Visit for preventive health examination 05/21/2015   Breast cancer screening 05/21/2015   Arm lesion 07/02/2014   Pain in limb 01/13/2014   Neuropathic pain of both legs 01/13/2014   Hip pain 01/12/2014   Allergic rhinitis 03/10/2013   Back pain 03/08/2013   Depression 12/18/2012   PVD (peripheral vascular disease) ([place]) 01/21/2012   Hyperlipidemia 12/06/2010   Overweight(278.02) 12/06/2010   RESTLESS LEG SYNDROME 10/25/2010   Migraine without aura 10/07/2010   Hereditary and idiopathic peripheral  neuropathy 10/07/2010   Essential hypertension, benign 10/07/2010   DISTURBANCE OF SKIN SENSATION 10/07/2010   HEART MURMUR, HX OF 10/07/2010   NEPHROLITHIASIS, HX OF 10/07/2010   Past Medical History:  Diagnosis Date   Abscess of great toe, right    Acute on chronic diastolic CHF (congestive heart failure) ([place]) 06/24/2018   Acute osteomyelitis of toe, left ([place]) 09/05/2016   Amputation of right great toe ([place]) 12/22/2018   Anxiety    Cellulitis of foot, right 12/11/2018   COMMON MIGRAINE 10/07/2010   Decreased visual acuity 11/10/2016   Depression 12/18/2012   Diabetes mellitus type II, uncontrolled ([place]) 10/07/2010   Qualifier: Diagnosis of  By: Charlett Blake MD, [place]     Diabetic foot  infection ([place]) 08/26/2016   Diabetic infection of right foot ([place])    Disturbance of skin sensation 10/07/2010   Gangrene of right foot ([place])    Heart murmur    History of kidney stones    "years ago"   Hyperlipidemia 12/06/2010   Hypertension    Lipoma of abdominal wall 10/05/2016   Overweight(278.02) 12/06/2010   PERIPHERAL NEUROPATHY, FEET 10/07/2010   PVD (peripheral vascular disease) ([place]) 01/21/2012   RESTLESS LEG SYNDROME 10/25/2010   Stroke ([place]) 2014, 2017   most recently in 2/17 - intracerebral hemorrhage    Family History  Problem Relation Age of Onset   Arthritis Mother    Stroke Brother        previous smoker   Alcohol abuse Brother        in remission   Leukemia Brother    Diabetes Paternal Grandmother    Healthy Son     Past Surgical History:  Procedure Laterality Date   AMPUTATION Right 12/13/2018   Procedure: AMPUTATION RIGHT GREAT TOE, LOCAL RELOCATION OF TISSUE FOR WOUND CLOSURE 9cm x 3cm, VAC APPLICATION;  Surgeon: [name], MD;  Location: [place];  Service: Orthopedics;  Laterality: Right;   AMPUTATION Right 12/31/2018   Procedure: RIGHT BELOW KNEE AMPUTATION;  Surgeon: [name], MD;  Location: [place];  Service: Orthopedics;  Laterality:  Right;   AMPUTATION TOE Left 09/09/2016   Procedure: AMPUTATION OF LEFT GREAT TOE;  Surgeon: Milly Jakob, MD;  Location: [place] [facility];  Service: Orthopedics;  Laterality: Left;   CARDIAC CATHETERIZATION  06/24/2018   CORONARY ARTERY BYPASS GRAFT N/A 06/28/2018   Procedure: CORONARY ARTERY BYPASS GRAFTING (CABG) times  four, using left internal mammary artery, endoscopically harvested right saphenous vein, and harvested left radial artery;  Surgeon: [name], MD;  Location: [place];  Service: Open Heart Surgery;  Laterality: N/A;   ENDOVEIN HARVEST OF GREATER SAPHENOUS VEIN Right 06/28/2018   Procedure: ENDOVEIN HARVEST OF GREATER SAPHENOUS VEIN;  Surgeon: [name], MD;  Location: [place];  Service: Open Heart Surgery;  Laterality: Right;   LEFT HEART CATH AND CORONARY ANGIOGRAPHY N/A 06/24/2018   Procedure: LEFT HEART CATH AND CORONARY ANGIOGRAPHY;  Surgeon: Troy Sine, MD;  Location: [facility];  Service: Cardiovascular;  Laterality: N/A;   RADIAL ARTERY HARVEST Left 06/28/2018   Procedure: RADIAL ARTERY HARVEST;  Surgeon: [name], MD;  Location: [facility];  Service: Open Heart Surgery;  Laterality: Left;   TEE WITHOUT CARDIOVERSION N/A 06/28/2018   Procedure: TRANSESOPHAGEAL ECHOCARDIOGRAM (TEE);  Surgeon: [name], MD;  Location: [place];  Service: Open Heart Surgery;  Laterality: N/A;   WISDOM TOOTH EXTRACTION     Social History   Occupational History   Occupation: Glass blower/designer  Tobacco Use   Smoking status: Former Smoker    Packs/day: 0.50    Quit date: 09/08/2005    Years since quitting: 13.7   Smokeless tobacco: Never Used  Substance and Sexual Activity   Alcohol use: No    Alcohol/week: 0.0 standard drinks   Drug use: Not Currently   Sexual activity: Yes    Partners: Male    Birth control/protection: None

## 2019-06-16 ENCOUNTER — Ambulatory Visit (INDEPENDENT_AMBULATORY_CARE_PROVIDER_SITE_OTHER): Payer: BC Managed Care – PPO | Admitting: Orthopedic Surgery

## 2019-06-16 VITALS — Wt 150.0 lb

## 2019-06-16 DIAGNOSIS — Z89511 Acquired absence of right leg below knee: Secondary | ICD-10-CM | POA: Diagnosis not present

## 2019-06-16 DIAGNOSIS — I5032 Chronic diastolic (congestive) heart failure: Secondary | ICD-10-CM | POA: Diagnosis not present

## 2019-06-16 DIAGNOSIS — S88111A Complete traumatic amputation at level between knee and ankle, right lower leg, initial encounter: Secondary | ICD-10-CM | POA: Diagnosis not present

## 2019-06-17 ENCOUNTER — Encounter: Payer: Self-pay | Admitting: Orthopedic Surgery

## 2019-06-17 NOTE — Progress Notes (Signed)
Office Visit Note   Patient: [name]           Date of Birth: July 08, 1966           MRN: [id number] Visit Date: 06/16/2019              Requested by: Mosie Lukes, MD [address] [place],  [place] [address] PCP: Mosie Lukes, MD  Chief Complaint  Patient presents with   Left Leg - Follow-up      HPI: Patient is a 53 year old woman who presents status post right transtibial amputation she is 5 months out from surgery she has been fit for prosthesis with [employer].  She also complains of a left heel ulcer.  Patient requests a motorized wheelchair.  Assessment & Plan: Visit Diagnoses:  1. Acquired absence of right leg below knee [facility])     Plan: Orders were placed for physical therapy for evaluation for a mobility electric chair.  Patient will continue Iodosorb dressing changes to the left heel ulcer.  Follow-Up Instructions: Return in about 3 weeks (around 07/07/2019).   Ortho Exam  Patient is alert, oriented, no adenopathy, well-dressed, normal affect, normal respiratory effort. Examination patient has a well consolidated right transtibial amputation.  Patient is currently wearing the compression sock.  Examination the left foot she has a decubitus ulcer which is 15 mm in diameter 1 mm deep there is no cellulitis no drainage no exposed bone or tendon no signs of infection.  Imaging: No results found. No images are attached to the encounter.  Labs: Lab Results  Component Value Date   HGBA1C 10.5 (H) 12/12/2018   HGBA1C 11.1 (H) 06/26/2018   HGBA1C 9.5 (H) 11/17/2017   ESRSEDRATE 130 (H) 12/12/2018   ESRSEDRATE 115 (H) 06/24/2018   ESRSEDRATE 107 (H) 09/04/2016   CRP 28.2 (H) 12/12/2018   CRP 7.2 (H) 09/04/2016   CRP 0.7 03/21/2015   LABURIC 3.2 06/13/2014   LABURIC 3.1 02/09/2014   REPTSTATUS 12/18/2018 FINAL 12/13/2018   GRAMSTAIN  12/13/2018    FEW WBC PRESENT,BOTH PMN AND MONONUCLEAR NO ORGANISMS SEEN    CULT  12/13/2018    FEW  STAPHYLOCOCCUS SIMULANS CRITICAL RESULT CALLED TO, READ BACK BY AND VERIFIED WITH: RN N FELIX [id number] AT 70 AM BY CM NO ANAEROBES ISOLATED Performed at [facility], [place] [address], [place], [place] [address]    LABORGA STAPHYLOCOCCUS SIMULANS 12/13/2018     Lab Results  Component Value Date   ALBUMIN 1.8 (L) 01/06/2019   ALBUMIN 2.4 (L) 12/11/2018   ALBUMIN 3.4 (L) 07/20/2018   PREALBUMIN 12.7 (L) 09/04/2016   LABURIC 3.2 06/13/2014   LABURIC 3.1 02/09/2014    Lab Results  Component Value Date   MG 2.1 12/16/2018   MG 2.8 (H) 06/29/2018   MG 3.0 (H) 06/29/2018   No results found for: [facility]  Lab Results  Component Value Date   PREALBUMIN 12.7 (L) 09/04/2016   CBC EXTENDED Latest Ref Rng & Units 01/14/2019 01/10/2019 01/06/2019  WBC 4.0 - 10.5 K/uL 9.1 12.1(H) 10.1  RBC 3.87 - 5.11 MIL/uL 3.34(L) 3.59(L) 3.24(L)  HGB 12.0 - 15.0 g/dL 8.8(L) 9.4(L) 8.7(L)  HCT 36.0 - 46.0 % 27.8(L) 29.7(L) 26.6(L)  PLT 150 - 400 K/uL 613(H) 699(H) 566(H)  NEUTROABS 1.7 - 7.7 K/uL - 8.7(H) 7.0  LYMPHSABS 0.7 - 4.0 K/uL - 2.0 1.5     Body mass index is 27.44 kg/m.  Orders:  Orders Placed  This Encounter  Procedures   Ambulatory referral to Physical Therapy   No orders of the defined types were placed in this encounter.    Procedures: No procedures performed  Clinical Data: No additional findings.  ROS:  All other systems negative, except as noted in the HPI. Review of Systems  Objective: Vital Signs: Wt 150 lb (68 kg)    LMP 02/12/2011 (Exact Date)    BMI 27.44 kg/m   Specialty Comments:  No specialty comments available.  PMFS History: Patient Active Problem List   Diagnosis Date Noted   Pressure injury of left heel, unstageable ([place]) 02/09/2019   Hypoglycemia    Diabetes mellitus type 2 in nonobese ([place])    Labile blood pressure    Labile blood glucose    Phantom limb pain (HCC)    CKD (chronic kidney disease), stage III    Hypothyroidism     Essential hypertension    Chronic diastolic congestive heart failure ([place])    Coronary artery disease involving coronary bypass graft of native heart without angina pectoris    Poorly controlled type 2 diabetes mellitus with peripheral neuropathy (HCC)    Acute on chronic anemia    History of below knee amputation, right ([place]) 12/31/2018   S/P CABG x 3 06/28/2018   Coronary artery disease involving native coronary artery of native heart with angina pectoris ([place]) 06/25/2018   Type 2 diabetes mellitus with vascular disease ([place]) 06/24/2018   Acute on chronic diastolic CHF (congestive heart failure) ([place]) 06/24/2018   Non-ST elevation (NSTEMI) myocardial infarction ([place]) 06/24/2018   Chest pain 06/23/2018   Glaucoma 09/17/2017   Diabetic retinopathy ([place]) 09/17/2017   Otitis media 11/10/2016   Decreased visual acuity 11/10/2016   Spinal stenosis, lumbar region with neurogenic claudication 10/22/2016   Other spondylosis with radiculopathy, lumbar region 10/13/2016   Lipoma of abdominal wall 10/05/2016   Severe protein-calorie malnutrition ([place]) 09/04/2016   Anemia of chronic disease 09/04/2016   Diabetic polyneuropathy associated with type 2 diabetes mellitus ([place]) 05/07/2016   Orthostatic hypotension 05/07/2016   Cerebrovascular disease 04/27/2016   N&V (nausea and vomiting) 04/01/2016   ICH (intracerebral hemorrhage) ([place]) 03/30/2016   Visit for preventive health examination 05/21/2015   Breast cancer screening 05/21/2015   Arm lesion 07/02/2014   Pain in limb 01/13/2014   Neuropathic pain of both legs 01/13/2014   Hip pain 01/12/2014   Allergic rhinitis 03/10/2013   Back pain 03/08/2013   Depression 12/18/2012   PVD (peripheral vascular disease) ([place]) 01/21/2012   Hyperlipidemia 12/06/2010   Overweight(278.02) 12/06/2010   RESTLESS LEG SYNDROME 10/25/2010   Migraine without aura 10/07/2010   Hereditary and idiopathic peripheral  neuropathy 10/07/2010   Essential hypertension, benign 10/07/2010   DISTURBANCE OF SKIN SENSATION 10/07/2010   HEART MURMUR, HX OF 10/07/2010   NEPHROLITHIASIS, HX OF 10/07/2010   Past Medical History:  Diagnosis Date   Abscess of great toe, right    Acute on chronic diastolic CHF (congestive heart failure) ([place]) 06/24/2018   Acute osteomyelitis of toe, left ([place]) 09/05/2016   Amputation of right great toe ([place]) 12/22/2018   Anxiety    Cellulitis of foot, right 12/11/2018   COMMON MIGRAINE 10/07/2010   Decreased visual acuity 11/10/2016   Depression 12/18/2012   Diabetes mellitus type II, uncontrolled ([place]) 10/07/2010   Qualifier: Diagnosis of  By: Charlett Blake MD, Stacey     Diabetic foot infection ([place]) 08/26/2016   Diabetic infection of right foot ([place])    Disturbance of skin  sensation 10/07/2010   Gangrene of right foot (HCC)    Heart murmur    History of kidney stones    "years ago"   Hyperlipidemia 12/06/2010   Hypertension    Lipoma of abdominal wall 10/05/2016   Overweight(278.02) 12/06/2010   PERIPHERAL NEUROPATHY, FEET 10/07/2010   PVD (peripheral vascular disease) ([place]) 01/21/2012   RESTLESS LEG SYNDROME 10/25/2010   Stroke ([place]) 2014, 2017   most recently in 2/17 - intracerebral hemorrhage    Family History  Problem Relation Age of Onset   Arthritis Mother    Stroke Brother        previous smoker   Alcohol abuse Brother        in remission   Leukemia Brother    Diabetes Paternal Grandmother    Healthy Son     Past Surgical History:  Procedure Laterality Date   AMPUTATION Right 12/13/2018   Procedure: AMPUTATION RIGHT GREAT TOE, LOCAL RELOCATION OF TISSUE FOR WOUND CLOSURE 9cm x 3cm, VAC APPLICATION;  Surgeon: [name], MD;  Location: [place];  Service: Orthopedics;  Laterality: Right;   AMPUTATION Right 12/31/2018   Procedure: RIGHT BELOW KNEE AMPUTATION;  Surgeon: [name], MD;  Location: [place] [facility];  Service: Orthopedics;  Laterality:  Right;   AMPUTATION TOE Left 09/09/2016   Procedure: AMPUTATION OF LEFT GREAT TOE;  Surgeon: Milly Jakob, MD;  Location: [place];  Service: Orthopedics;  Laterality: Left;   CARDIAC CATHETERIZATION  06/24/2018   CORONARY ARTERY BYPASS GRAFT N/A 06/28/2018   Procedure: CORONARY ARTERY BYPASS GRAFTING (CABG) times  four, using left internal mammary artery, endoscopically harvested right saphenous vein, and harvested left radial artery;  Surgeon: [name], MD;  Location: [place];  Service: Open Heart Surgery;  Laterality: N/A;   ENDOVEIN HARVEST OF GREATER SAPHENOUS VEIN Right 06/28/2018   Procedure: ENDOVEIN HARVEST OF GREATER SAPHENOUS VEIN;  Surgeon: [name], MD;  Location: [place];  Service: Open Heart Surgery;  Laterality: Right;   LEFT HEART CATH AND CORONARY ANGIOGRAPHY N/A 06/24/2018   Procedure: LEFT HEART CATH AND CORONARY ANGIOGRAPHY;  Surgeon: Troy Sine, MD;  Location: [facility];  Service: Cardiovascular;  Laterality: N/A;   RADIAL ARTERY HARVEST Left 06/28/2018   Procedure: RADIAL ARTERY HARVEST;  Surgeon: [name], MD;  Location: [place];  Service: Open Heart Surgery;  Laterality: Left;   TEE WITHOUT CARDIOVERSION N/A 06/28/2018   Procedure: TRANSESOPHAGEAL ECHOCARDIOGRAM (TEE);  Surgeon: [name], MD;  Location: [place];  Service: Open Heart Surgery;  Laterality: N/A;   WISDOM TOOTH EXTRACTION     Social History   Occupational History   Occupation: Glass blower/designer  Tobacco Use   Smoking status: Former Smoker    Packs/day: 0.50    Quit date: 09/08/2005    Years since quitting: 13.7   Smokeless tobacco: Never Used  Substance and Sexual Activity   Alcohol use: No    Alcohol/week: 0.0 standard drinks   Drug use: Not Currently   Sexual activity: Yes    Partners: Male    Birth control/protection: None

## 2019-06-30 ENCOUNTER — Ambulatory Visit (INDEPENDENT_AMBULATORY_CARE_PROVIDER_SITE_OTHER): Payer: BC Managed Care – PPO | Admitting: Orthopedic Surgery

## 2019-06-30 ENCOUNTER — Other Ambulatory Visit: Payer: Self-pay

## 2019-06-30 ENCOUNTER — Encounter: Payer: Self-pay | Admitting: Orthopedic Surgery

## 2019-06-30 VITALS — Ht 62.0 in | Wt 150.0 lb

## 2019-06-30 DIAGNOSIS — Z89511 Acquired absence of right leg below knee: Secondary | ICD-10-CM | POA: Diagnosis not present

## 2019-06-30 MED ORDER — PENTOXIFYLLINE ER 400 MG PO TBCR: 400.0000 mg | EXTENDED_RELEASE_TABLET | Freq: Three times a day (TID) | ORAL | 3 refills | Status: DC

## 2019-07-06 DIAGNOSIS — Z89511 Acquired absence of right leg below knee: Secondary | ICD-10-CM | POA: Diagnosis not present

## 2019-07-12 ENCOUNTER — Encounter: Payer: Self-pay | Admitting: Orthopedic Surgery

## 2019-07-12 NOTE — Progress Notes (Signed)
Office Visit Note   Patient: [name]           Date of Birth: [birth year]           MRN: [id number] Visit Date: 06/30/2019              Requested by: Mosie Lukes, MD [address] [place],  [place] [address] PCP: Mosie Lukes, MD  Chief Complaint  Patient presents with  . Left Foot - Follow-up  . Right Leg - Follow-up    12/31/18 right BKA      HPI: Patient is 6 weeks status post right transtibial amputation.  She is using Iodosorb on a left heel ulcer she has prosthetic fitting with [employer].   Assessment & Plan: Visit Diagnoses:  1. Acquired absence of right leg below knee (HCC)     Plan: Continue using ointment dressing changes to the left heel prescription provided for [place]  Follow-Up Instructions: Return in about 4 weeks (around 07/28/2019).   Ortho Exam  Patient is alert, oriented, no adenopathy, well-dressed, normal affect, normal respiratory effort. Examination the right transtibial amputation is healed well patient has been casted for her prosthesis.  Semination of the left foot Doppler shows monophasic dorsalis pedis and posterior tibial pulse.  Patient states she has been told by vascular surgery there are no revascularization options.  She had an ischemic left heel ulcer is 2 cm in diameter 1 mm deep.  There is no cellulitis no drainage no signs of infection.  Her hemoglobin A1c is 10.5.  Imaging: No results found. No images are attached to the encounter.  Labs: Lab Results  Component Value Date   HGBA1C 10.5 (H) 12/12/2018   HGBA1C 11.1 (H) 06/26/2018   HGBA1C 9.5 (H) 11/17/2017   ESRSEDRATE 130 (H) 12/12/2018   ESRSEDRATE 115 (H) 06/24/2018   ESRSEDRATE 107 (H) 09/04/2016   CRP 28.2 (H) 12/12/2018   CRP 7.2 (H) 09/04/2016   CRP 0.7 03/21/2015   LABURIC 3.2 06/13/2014   LABURIC 3.1 02/09/2014   REPTSTATUS 12/18/2018 FINAL 12/13/2018   GRAMSTAIN  12/13/2018    FEW WBC PRESENT,BOTH PMN AND MONONUCLEAR NO ORGANISMS SEEN     CULT  12/13/2018    FEW STAPHYLOCOCCUS SIMULANS CRITICAL RESULT CALLED TO, READ BACK BY AND VERIFIED WITH: RN N FELIX [id number] AT 69 AM BY CM NO ANAEROBES ISOLATED Performed at [facility], [place] [address], [place], [place] [address]    LABORGA STAPHYLOCOCCUS SIMULANS 12/13/2018     Lab Results  Component Value Date   ALBUMIN 1.8 (L) 01/06/2019   ALBUMIN 2.4 (L) 12/11/2018   ALBUMIN 3.4 (L) 07/20/2018   PREALBUMIN 12.7 (L) 09/04/2016   LABURIC 3.2 06/13/2014   LABURIC 3.1 02/09/2014    Lab Results  Component Value Date   MG 2.1 12/16/2018   MG 2.8 (H) 06/29/2018   MG 3.0 (H) 06/29/2018   No results found for: [facility]  Lab Results  Component Value Date   PREALBUMIN 12.7 (L) 09/04/2016   CBC EXTENDED Latest Ref Rng & Units 01/14/2019 01/10/2019 01/06/2019  WBC 4.0 - 10.5 K/uL 9.1 12.1(H) 10.1  RBC 3.87 - 5.11 MIL/uL 3.34(L) 3.59(L) 3.24(L)  HGB 12.0 - 15.0 g/dL 8.8(L) 9.4(L) 8.7(L)  HCT 36.0 - 46.0 % 27.8(L) 29.7(L) 26.6(L)  PLT 150 - 400 K/uL 613(H) 699(H) 566(H)  NEUTROABS 1.7 - 7.7 K/uL - 8.7(H) 7.0  LYMPHSABS 0.7 - 4.0 K/uL - 2.0 1.5  Body mass index is 27.44 kg/m.  Orders:  No orders of the defined types were placed in this encounter.  Meds ordered this encounter  Medications  . pentoxifylline (TRENTAL) 400 MG CR tablet    Sig: Take 1 tablet (400 mg total) by mouth 3 (three) times daily with meals.    Dispense:  90 tablet    Refill:  3     Procedures: No procedures performed  Clinical Data: No additional findings.  ROS:  All other systems negative, except as noted in the HPI. Review of Systems  Objective: Vital Signs: Ht 5\' 2"  (1.575 m)   Wt 150 lb (68 kg)   LMP 02/12/2011 (Exact Date)   BMI 27.44 kg/m   Specialty Comments:  No specialty comments available.  PMFS History: Patient Active Problem List   Diagnosis Date Noted  . Pressure injury of left heel, unstageable ([place]) 02/09/2019  . Hypoglycemia   . Diabetes mellitus type  2 in nonobese (HCC)   . Labile blood pressure   . Labile blood glucose   . Phantom limb pain ([place])   . CKD (chronic kidney disease), stage III   . Hypothyroidism   . Essential hypertension   . Chronic diastolic congestive heart failure ([place])   . Coronary artery disease involving coronary bypass graft of native heart without angina pectoris   . Poorly controlled type 2 diabetes mellitus with peripheral neuropathy ([place])   . Acute on chronic anemia   . History of below knee amputation, right ([place]) 12/31/2018  . S/P CABG x 3 06/28/2018  . Coronary artery disease involving native coronary artery of native heart with angina pectoris ([place]) 06/25/2018  . Type 2 diabetes mellitus with vascular disease ([place]) 06/24/2018  . Acute on chronic diastolic CHF (congestive heart failure) ([place]) 06/24/2018  . Non-ST elevation (NSTEMI) myocardial infarction ([place]) 06/24/2018  . Chest pain 06/23/2018  . Glaucoma 09/17/2017  . Diabetic retinopathy ([place]) 09/17/2017  . Otitis media 11/10/2016  . Decreased visual acuity 11/10/2016  . Spinal stenosis, lumbar region with neurogenic claudication 10/22/2016  . Other spondylosis with radiculopathy, lumbar region 10/13/2016  . Lipoma of abdominal wall 10/05/2016  . Severe protein-calorie malnutrition ([place]) 09/04/2016  . Anemia of chronic disease 09/04/2016  . Diabetic polyneuropathy associated with type 2 diabetes mellitus ([place]) 05/07/2016  . Orthostatic hypotension 05/07/2016  . Cerebrovascular disease 04/27/2016  . N&V (nausea and vomiting) 04/01/2016  . ICH (intracerebral hemorrhage) ([place]) 03/30/2016  . Visit for preventive health examination 05/21/2015  . Breast cancer screening 05/21/2015  . Arm lesion 07/02/2014  . Pain in limb 01/13/2014  . Neuropathic pain of both legs 01/13/2014  . Hip pain 01/12/2014  . Allergic rhinitis 03/10/2013  . Back pain 03/08/2013  . Depression 12/18/2012  . PVD (peripheral vascular disease) ([place]) 01/21/2012  .  Hyperlipidemia 12/06/2010  . Overweight(278.02) 12/06/2010  . RESTLESS LEG SYNDROME 10/25/2010  . Migraine without aura 10/07/2010  . Hereditary and idiopathic peripheral neuropathy 10/07/2010  . Essential hypertension, benign 10/07/2010  . DISTURBANCE OF SKIN SENSATION 10/07/2010  . HEART MURMUR, HX OF 10/07/2010  . NEPHROLITHIASIS, HX OF 10/07/2010   Past Medical History:  Diagnosis Date  . Abscess of great toe, right   . Acute on chronic diastolic CHF (congestive heart failure) ([place]) 06/24/2018  . Acute osteomyelitis of toe, left ([place]) 09/05/2016  . Amputation of right great toe ([place]) 12/22/2018  . Anxiety   . Cellulitis of foot, right 12/11/2018  . COMMON MIGRAINE 10/07/2010  . Decreased visual  acuity 11/10/2016  . Depression 12/18/2012  . Diabetes mellitus type II, uncontrolled ([place]) 10/07/2010   Qualifier: Diagnosis of  By: Charlett Blake MD, [name]    . Diabetic foot infection ([place]) 08/26/2016  . Diabetic infection of right foot ([place])   . Disturbance of skin sensation 10/07/2010  . Gangrene of right foot ([place])   . Heart murmur   . History of kidney stones    "years ago"  . Hyperlipidemia 12/06/2010  . Hypertension   . Lipoma of abdominal wall 10/05/2016  . Overweight(278.02) 12/06/2010  . PERIPHERAL NEUROPATHY, FEET 10/07/2010  . PVD (peripheral vascular disease) ([place]) 01/21/2012  . RESTLESS LEG SYNDROME 10/25/2010  . Stroke [facility]) 2014, 2017   most recently in 2/17 - intracerebral hemorrhage    Family History  Problem Relation Age of Onset  . Arthritis Mother   . Stroke Brother        previous smoker  . Alcohol abuse Brother        in remission  . Leukemia Brother   . Diabetes Paternal Grandmother   . Healthy Son     Past Surgical History:  Procedure Laterality Date  . AMPUTATION Right 12/13/2018   Procedure: AMPUTATION RIGHT GREAT TOE, LOCAL RELOCATION OF TISSUE FOR WOUND CLOSURE 9cm x 3cm, VAC APPLICATION;  Surgeon: [name], MD;  Location: [facility];  Service: Orthopedics;   Laterality: Right;  . AMPUTATION Right 12/31/2018   Procedure: RIGHT BELOW KNEE AMPUTATION;  Surgeon: [name], MD;  Location: [facility];  Service: Orthopedics;  Laterality: Right;  . AMPUTATION TOE Left 09/09/2016   Procedure: AMPUTATION OF LEFT GREAT TOE;  Surgeon: Milly Jakob, MD;  Location: [facility];  Service: Orthopedics;  Laterality: Left;  . CARDIAC CATHETERIZATION  06/24/2018  . CORONARY ARTERY BYPASS GRAFT N/A 06/28/2018   Procedure: CORONARY ARTERY BYPASS GRAFTING (CABG) times  four, using left internal mammary artery, endoscopically harvested right saphenous vein, and harvested left radial artery;  Surgeon: [name], MD;  Location: [facility];  Service: Open Heart Surgery;  Laterality: N/A;  . ENDOVEIN HARVEST OF GREATER SAPHENOUS VEIN Right 06/28/2018   Procedure: ENDOVEIN HARVEST OF GREATER SAPHENOUS VEIN;  Surgeon: [name], MD;  Location: [facility];  Service: Open Heart Surgery;  Laterality: Right;  . LEFT HEART CATH AND CORONARY ANGIOGRAPHY N/A 06/24/2018   Procedure: LEFT HEART CATH AND CORONARY ANGIOGRAPHY;  Surgeon: Troy Sine, MD;  Location: [facility];  Service: Cardiovascular;  Laterality: N/A;  . RADIAL ARTERY HARVEST Left 06/28/2018   Procedure: RADIAL ARTERY HARVEST;  Surgeon: [name], MD;  Location: [facility];  Service: Open Heart Surgery;  Laterality: Left;  . TEE WITHOUT CARDIOVERSION N/A 06/28/2018   Procedure: TRANSESOPHAGEAL ECHOCARDIOGRAM (TEE);  Surgeon: [name], MD;  Location: [place];  Service: Open Heart Surgery;  Laterality: N/A;  . WISDOM TOOTH EXTRACTION     Social History   Occupational History  . Occupation: Glass blower/designer  Tobacco Use  . Smoking status: Former Smoker    Packs/day: 0.50    Quit date: 09/08/2005    Years since quitting: 13.8  . Smokeless tobacco: Never Used  Substance and Sexual Activity  . Alcohol use: No    Alcohol/week: 0.0 standard drinks  . Drug use: Not  Currently  . Sexual activity: Yes    Partners: Male    Birth control/protection: None

## 2019-07-14 ENCOUNTER — Encounter: Payer: Self-pay | Admitting: Orthopedic Surgery

## 2019-07-14 ENCOUNTER — Ambulatory Visit (INDEPENDENT_AMBULATORY_CARE_PROVIDER_SITE_OTHER): Payer: BC Managed Care – PPO | Admitting: Orthopedic Surgery

## 2019-07-14 ENCOUNTER — Other Ambulatory Visit: Payer: Self-pay

## 2019-07-14 VITALS — Ht 62.0 in | Wt 150.0 lb

## 2019-07-14 DIAGNOSIS — L89620 Pressure ulcer of left heel, unstageable: Secondary | ICD-10-CM

## 2019-07-14 DIAGNOSIS — Z89511 Acquired absence of right leg below knee: Secondary | ICD-10-CM | POA: Diagnosis not present

## 2019-07-17 DIAGNOSIS — S88111A Complete traumatic amputation at level between knee and ankle, right lower leg, initial encounter: Secondary | ICD-10-CM | POA: Diagnosis not present

## 2019-07-17 DIAGNOSIS — I5032 Chronic diastolic (congestive) heart failure: Secondary | ICD-10-CM | POA: Diagnosis not present

## 2019-07-19 ENCOUNTER — Encounter: Payer: Self-pay | Admitting: Orthopedic Surgery

## 2019-07-19 NOTE — Progress Notes (Signed)
Office Visit Note   Patient: [name]           Date of Birth: 02-29-1966           MRN: [id number] Visit Date: 07/14/2019              Requested by: Mosie Lukes, MD [address] [place],  [place] [address] PCP: Mosie Lukes, MD  Chief Complaint  Patient presents with  . Right Leg - Follow-up    Right BKA  . Left Foot - Follow-up    Heel ulcer       HPI: Patient is a 53 year old woman who presents with left heel ulcer and right transtibial amputation.  She has an appointment with [facility] next week for prosthetic fitting.  Patient states that she is not using the Trental.  Assessment & Plan: Visit Diagnoses:  1. Acquired absence of right leg below knee ([place])   2. Pressure injury of left heel, unstageable ([place])     Plan: Patient still has some healing on the residual limb prior to prosthetic fitting.  She will continue using Iodosorb pressure unloading for the left heel ulcer.  She will hold on using a shoe for the left foot until the ulcer is healed.  Follow-Up Instructions: Return in about 4 weeks (around 08/11/2019).   Ortho Exam  Patient is alert, oriented, no adenopathy, well-dressed, normal affect, normal respiratory effort. Examination patient has slowly improving residual limb with a scab laterally and good granulation tissue medially with a scab distally.  Imaging: No results found.   Labs: Lab Results  Component Value Date   HGBA1C 10.5 (H) 12/12/2018   HGBA1C 11.1 (H) 06/26/2018   HGBA1C 9.5 (H) 11/17/2017   ESRSEDRATE 130 (H) 12/12/2018   ESRSEDRATE 115 (H) 06/24/2018   ESRSEDRATE 107 (H) 09/04/2016   CRP 28.2 (H) 12/12/2018   CRP 7.2 (H) 09/04/2016   CRP 0.7 03/21/2015   LABURIC 3.2 06/13/2014   LABURIC 3.1 02/09/2014   REPTSTATUS 12/18/2018 FINAL 12/13/2018   GRAMSTAIN  12/13/2018    FEW WBC PRESENT,BOTH PMN AND MONONUCLEAR NO ORGANISMS SEEN    CULT  12/13/2018    FEW STAPHYLOCOCCUS SIMULANS CRITICAL RESULT CALLED  TO, READ BACK BY AND VERIFIED WITH: RN N FELIX [id number] AT 47 AM BY CM NO ANAEROBES ISOLATED Performed at [facility], [place] [address], [place], [name] [address]    LABORGA STAPHYLOCOCCUS SIMULANS 12/13/2018     Lab Results  Component Value Date   ALBUMIN 1.8 (L) 01/06/2019   ALBUMIN 2.4 (L) 12/11/2018   ALBUMIN 3.4 (L) 07/20/2018   PREALBUMIN 12.7 (L) 09/04/2016   LABURIC 3.2 06/13/2014   LABURIC 3.1 02/09/2014    Lab Results  Component Value Date   MG 2.1 12/16/2018   MG 2.8 (H) 06/29/2018   MG 3.0 (H) 06/29/2018   No results found for: [facility]  Lab Results  Component Value Date   PREALBUMIN 12.7 (L) 09/04/2016   CBC EXTENDED Latest Ref Rng & Units 01/14/2019 01/10/2019 01/06/2019  WBC 4.0 - 10.5 K/uL 9.1 12.1(H) 10.1  RBC 3.87 - 5.11 MIL/uL 3.34(L) 3.59(L) 3.24(L)  HGB 12.0 - 15.0 g/dL 8.8(L) 9.4(L) 8.7(L)  HCT 36.0 - 46.0 % 27.8(L) 29.7(L) 26.6(L)  PLT 150 - 400 K/uL 613(H) 699(H) 566(H)  NEUTROABS 1.7 - 7.7 K/uL - 8.7(H) 7.0  LYMPHSABS 0.7 - 4.0 K/uL - 2.0 1.5     Body mass index is 27.44 kg/m.  Orders:  No orders of the defined types were placed in this encounter.  No orders of the defined types were placed in this encounter.    Procedures: No procedures performed  Clinical Data: No additional findings.  ROS:  All other systems negative, except as noted in the HPI. Review of Systems  Objective: Vital Signs: Ht 5\' 2"  (1.575 m)   Wt 150 lb (68 kg)   LMP 02/12/2011 (Exact Date)   BMI 27.44 kg/m   Specialty Comments:  No specialty comments available.  PMFS History: Patient Active Problem List   Diagnosis Date Noted  . Pressure injury of left heel, unstageable ([place]) 02/09/2019  . Hypoglycemia   . Diabetes mellitus type 2 in nonobese (HCC)   . Labile blood pressure   . Labile blood glucose   . Phantom limb pain ([place])   . CKD (chronic kidney disease), stage III   . Hypothyroidism   . Essential hypertension   . Chronic diastolic  congestive heart failure ([place])   . Coronary artery disease involving coronary bypass graft of native heart without angina pectoris   . Poorly controlled type 2 diabetes mellitus with peripheral neuropathy ([place])   . Acute on chronic anemia   . History of below knee amputation, right ([place]) 12/31/2018  . S/P CABG x 3 06/28/2018  . Coronary artery disease involving native coronary artery of native heart with angina pectoris ([place]) 06/25/2018  . Type 2 diabetes mellitus with vascular disease ([place]) 06/24/2018  . Acute on chronic diastolic CHF (congestive heart failure) ([place]) 06/24/2018  . Non-ST elevation (NSTEMI) myocardial infarction ([place]) 06/24/2018  . Chest pain 06/23/2018  . Glaucoma 09/17/2017  . Diabetic retinopathy ([place]) 09/17/2017  . Otitis media 11/10/2016  . Decreased visual acuity 11/10/2016  . Spinal stenosis, lumbar region with neurogenic claudication 10/22/2016  . Other spondylosis with radiculopathy, lumbar region 10/13/2016  . Lipoma of abdominal wall 10/05/2016  . Severe protein-calorie malnutrition ([place]) 09/04/2016  . Anemia of chronic disease 09/04/2016  . Diabetic polyneuropathy associated with type 2 diabetes mellitus ([place]) 05/07/2016  . Orthostatic hypotension 05/07/2016  . Cerebrovascular disease 04/27/2016  . N&V (nausea and vomiting) 04/01/2016  . ICH (intracerebral hemorrhage) ([place]) 03/30/2016  . Visit for preventive health examination 05/21/2015  . Breast cancer screening 05/21/2015  . Arm lesion 07/02/2014  . Pain in limb 01/13/2014  . Neuropathic pain of both legs 01/13/2014  . Hip pain 01/12/2014  . Allergic rhinitis 03/10/2013  . Back pain 03/08/2013  . Depression 12/18/2012  . PVD (peripheral vascular disease) ([place]) 01/21/2012  . Hyperlipidemia 12/06/2010  . Overweight(278.02) 12/06/2010  . RESTLESS LEG SYNDROME 10/25/2010  . Migraine without aura 10/07/2010  . Hereditary and idiopathic peripheral neuropathy 10/07/2010  . Essential hypertension,  benign 10/07/2010  . DISTURBANCE OF SKIN SENSATION 10/07/2010  . HEART MURMUR, HX OF 10/07/2010  . NEPHROLITHIASIS, HX OF 10/07/2010   Past Medical History:  Diagnosis Date  . Abscess of great toe, right   . Acute on chronic diastolic CHF (congestive heart failure) ([place]) 06/24/2018  . Acute osteomyelitis of toe, left ([place]) 09/05/2016  . Amputation of right great toe ([place]) 12/22/2018  . Anxiety   . Cellulitis of foot, right 12/11/2018  . COMMON MIGRAINE 10/07/2010  . Decreased visual acuity 11/10/2016  . Depression 12/18/2012  . Diabetes mellitus type II, uncontrolled ([place]) 10/07/2010   Qualifier: Diagnosis of  By: Charlett Blake MD, [name]    . Diabetic foot infection ([place]) 08/26/2016  . Diabetic infection of right foot ([place])   .  Disturbance of skin sensation 10/07/2010  . Gangrene of right foot ([place])   . Heart murmur   . History of kidney stones    "years ago"  . Hyperlipidemia 12/06/2010  . Hypertension   . Lipoma of abdominal wall 10/05/2016  . Overweight(278.02) 12/06/2010  . PERIPHERAL NEUROPATHY, FEET 10/07/2010  . PVD (peripheral vascular disease) ([place]) 01/21/2012  . RESTLESS LEG SYNDROME 10/25/2010  . Stroke [facility]) 2014, 2017   most recently in 2/17 - intracerebral hemorrhage    Family History  Problem Relation Age of Onset  . Arthritis Mother   . Stroke Brother        previous smoker  . Alcohol abuse Brother        in remission  . Leukemia Brother   . Diabetes Paternal Grandmother   . Healthy Son     Past Surgical History:  Procedure Laterality Date  . AMPUTATION Right 12/13/2018   Procedure: AMPUTATION RIGHT GREAT TOE, LOCAL RELOCATION OF TISSUE FOR WOUND CLOSURE 9cm x 3cm, VAC APPLICATION;  Surgeon: [name], MD;  Location: [facility];  Service: Orthopedics;  Laterality: Right;  . AMPUTATION Right 12/31/2018   Procedure: RIGHT BELOW KNEE AMPUTATION;  Surgeon: [name], MD;  Location: [facility];  Service: Orthopedics;  Laterality: Right;  . AMPUTATION TOE Left 09/09/2016    Procedure: AMPUTATION OF LEFT GREAT TOE;  Surgeon: Milly Jakob, MD;  Location: [facility];  Service: Orthopedics;  Laterality: Left;  . CARDIAC CATHETERIZATION  06/24/2018  . CORONARY ARTERY BYPASS GRAFT N/A 06/28/2018   Procedure: CORONARY ARTERY BYPASS GRAFTING (CABG) times  four, using left internal mammary artery, endoscopically harvested right saphenous vein, and harvested left radial artery;  Surgeon: [name], MD;  Location: [facility];  Service: Open Heart Surgery;  Laterality: N/A;  . ENDOVEIN HARVEST OF GREATER SAPHENOUS VEIN Right 06/28/2018   Procedure: ENDOVEIN HARVEST OF GREATER SAPHENOUS VEIN;  Surgeon: [name], MD;  Location: [facility];  Service: Open Heart Surgery;  Laterality: Right;  . LEFT HEART CATH AND CORONARY ANGIOGRAPHY N/A 06/24/2018   Procedure: LEFT HEART CATH AND CORONARY ANGIOGRAPHY;  Surgeon: Troy Sine, MD;  Location: [facility];  Service: Cardiovascular;  Laterality: N/A;  . RADIAL ARTERY HARVEST Left 06/28/2018   Procedure: RADIAL ARTERY HARVEST;  Surgeon: [name], MD;  Location: [facility];  Service: Open Heart Surgery;  Laterality: Left;  . TEE WITHOUT CARDIOVERSION N/A 06/28/2018   Procedure: TRANSESOPHAGEAL ECHOCARDIOGRAM (TEE);  Surgeon: [name], MD;  Location: [facility];  Service: Open Heart Surgery;  Laterality: N/A;  . WISDOM TOOTH EXTRACTION     Social History   Occupational History  . Occupation: Glass blower/designer  Tobacco Use  . Smoking status: Former Smoker    Packs/day: 0.50    Quit date: 09/08/2005    Years since quitting: 13.8  . Smokeless tobacco: Never Used  Substance and Sexual Activity  . Alcohol use: No    Alcohol/week: 0.0 standard drinks  . Drug use: Not Currently  . Sexual activity: Yes    Partners: Male    Birth control/protection: None

## 2019-08-08 ENCOUNTER — Other Ambulatory Visit: Payer: Self-pay

## 2019-08-08 ENCOUNTER — Encounter: Payer: Self-pay | Admitting: Physician Assistant

## 2019-08-08 ENCOUNTER — Ambulatory Visit (INDEPENDENT_AMBULATORY_CARE_PROVIDER_SITE_OTHER): Payer: BC Managed Care – PPO | Admitting: Orthopedic Surgery

## 2019-08-08 VITALS — Ht 62.0 in | Wt 150.0 lb

## 2019-08-08 DIAGNOSIS — L89620 Pressure ulcer of left heel, unstageable: Secondary | ICD-10-CM

## 2019-08-08 MED ORDER — CADEXOMER IODINE 0.9 % EX GEL: 1.0000 "application " | Freq: Every day | CUTANEOUS | 3 refills | Status: DC

## 2019-08-08 NOTE — Progress Notes (Signed)
Office Visit Note   Patient: [name]           Date of Birth: [birth year]           MRN: [id number] Visit Date: 08/08/2019              Requested by: Mosie Lukes, MD [address] [place],  [place] [address] PCP: Mosie Lukes, MD  Chief Complaint  Patient presents with   Right Leg - Follow-up    12/31/18 right BKA    Left Foot - Follow-up    Heel ulcer       HPI: This is a pleasant 53 year old woman who is now 8 months status post right below-knee amputation.  She is doing well regards to this she is currently being followed for a left heel ulcer she was last seen a few weeks ago and finds it to be very painful she is trying to restrain from weightbearing.  She is using her Trental.  Assessment & Plan: Visit Diagnoses: No diagnosis found.  Plan: She will continue to use Iodosorb to the wound.  She should continue to avoid weightbearing follow-up for recheck in 2 weeks  Follow-Up Instructions: No follow-ups on file.   Ortho Exam  Patient is alert, oriented, no adenopathy, well-dressed, normal affect, normal respiratory effort. Left heel ulcer measuring 76mmx 45mm x 3 mm deep.  She had dorsalis pedis and posterior tibial triphasic pulses by Doppler.  There is no foul odor coming from the wound  Imaging: No results found. No images are attached to the encounter.  Labs: Lab Results  Component Value Date   HGBA1C 10.5 (H) 12/12/2018   HGBA1C 11.1 (H) 06/26/2018   HGBA1C 9.5 (H) 11/17/2017   ESRSEDRATE 130 (H) 12/12/2018   ESRSEDRATE 115 (H) 06/24/2018   ESRSEDRATE 107 (H) 09/04/2016   CRP 28.2 (H) 12/12/2018   CRP 7.2 (H) 09/04/2016   CRP 0.7 03/21/2015   LABURIC 3.2 06/13/2014   LABURIC 3.1 02/09/2014   REPTSTATUS 12/18/2018 FINAL 12/13/2018   GRAMSTAIN  12/13/2018    FEW WBC PRESENT,BOTH PMN AND MONONUCLEAR NO ORGANISMS SEEN    CULT  12/13/2018    FEW STAPHYLOCOCCUS SIMULANS CRITICAL RESULT CALLED TO, READ BACK BY AND VERIFIED  WITH: RN N FELIX [id number] AT 10 AM BY CM NO ANAEROBES ISOLATED Performed at [facility], [place] [address], [place], [place] [address]    LABORGA STAPHYLOCOCCUS SIMULANS 12/13/2018     Lab Results  Component Value Date   ALBUMIN 1.8 (L) 01/06/2019   ALBUMIN 2.4 (L) 12/11/2018   ALBUMIN 3.4 (L) 07/20/2018   PREALBUMIN 12.7 (L) 09/04/2016   LABURIC 3.2 06/13/2014   LABURIC 3.1 02/09/2014    Lab Results  Component Value Date   MG 2.1 12/16/2018   MG 2.8 (H) 06/29/2018   MG 3.0 (H) 06/29/2018   No results found for: [facility]  Lab Results  Component Value Date   PREALBUMIN 12.7 (L) 09/04/2016   CBC EXTENDED Latest Ref Rng & Units 01/14/2019 01/10/2019 01/06/2019  WBC 4.0 - 10.5 K/uL 9.1 12.1(H) 10.1  RBC 3.87 - 5.11 MIL/uL 3.34(L) 3.59(L) 3.24(L)  HGB 12.0 - 15.0 g/dL 8.8(L) 9.4(L) 8.7(L)  HCT 36.0 - 46.0 % 27.8(L) 29.7(L) 26.6(L)  PLT 150 - 400 K/uL 613(H) 699(H) 566(H)  NEUTROABS 1.7 - 7.7 K/uL - 8.7(H) 7.0  LYMPHSABS 0.7 - 4.0 K/uL - 2.0 1.5     Body mass index is 27.44 kg/m.  Orders:  No orders of the defined types were placed in this encounter.  Meds ordered this encounter  Medications   cadexomer iodine (IODOSORB) 0.9 % gel    Sig: Apply 1 application topically daily. Apply to the affected area daily plus dry dressing    Dispense:  40 g    Refill:  3     Procedures: No procedures performed  Clinical Data: No additional findings.  ROS:  All other systems negative, except as noted in the HPI. Review of Systems  Objective: Vital Signs: Ht 5\' 2"  (1.575 m)    Wt 150 lb (68 kg)    LMP 02/12/2011 (Exact Date)    BMI 27.44 kg/m   Specialty Comments:  No specialty comments available.  PMFS History: Patient Active Problem List   Diagnosis Date Noted   Pressure injury of left heel, unstageable ([place]) 02/09/2019   Hypoglycemia    Diabetes mellitus type 2 in nonobese ([place])    Labile blood pressure    Labile blood glucose    Phantom limb pain  (HCC)    CKD (chronic kidney disease), stage III    Hypothyroidism    Essential hypertension    Chronic diastolic congestive heart failure ([place])    Coronary artery disease involving coronary bypass graft of native heart without angina pectoris    Poorly controlled type 2 diabetes mellitus with peripheral neuropathy (HCC)    Acute on chronic anemia    History of below knee amputation, right ([place]) 12/31/2018   S/P CABG x 3 06/28/2018   Coronary artery disease involving native coronary artery of native heart with angina pectoris ([place]) 06/25/2018   Type 2 diabetes mellitus with vascular disease ([place]) 06/24/2018   Acute on chronic diastolic CHF (congestive heart failure) ([place]) 06/24/2018   Non-ST elevation (NSTEMI) myocardial infarction ([place]) 06/24/2018   Chest pain 06/23/2018   Glaucoma 09/17/2017   Diabetic retinopathy ([place]) 09/17/2017   Otitis media 11/10/2016   Decreased visual acuity 11/10/2016   Spinal stenosis, lumbar region with neurogenic claudication 10/22/2016   Other spondylosis with radiculopathy, lumbar region 10/13/2016   Lipoma of abdominal wall 10/05/2016   Severe protein-calorie malnutrition ([place]) 09/04/2016   Anemia of chronic disease 09/04/2016   Diabetic polyneuropathy associated with type 2 diabetes mellitus ([place]) 05/07/2016   Orthostatic hypotension 05/07/2016   Cerebrovascular disease 04/27/2016   N&V (nausea and vomiting) 04/01/2016   ICH (intracerebral hemorrhage) ([place]) 03/30/2016   Visit for preventive health examination 05/21/2015   Breast cancer screening 05/21/2015   Arm lesion 07/02/2014   Pain in limb 01/13/2014   Neuropathic pain of both legs 01/13/2014   Hip pain 01/12/2014   Allergic rhinitis 03/10/2013   Back pain 03/08/2013   Depression 12/18/2012   PVD (peripheral vascular disease) ([place]) 01/21/2012   Hyperlipidemia 12/06/2010   Overweight(278.02) 12/06/2010   RESTLESS LEG SYNDROME 10/25/2010    Migraine without aura 10/07/2010   Hereditary and idiopathic peripheral neuropathy 10/07/2010   Essential hypertension, benign 10/07/2010   DISTURBANCE OF SKIN SENSATION 10/07/2010   HEART MURMUR, HX OF 10/07/2010   NEPHROLITHIASIS, HX OF 10/07/2010   Past Medical History:  Diagnosis Date   Abscess of great toe, right    Acute on chronic diastolic CHF (congestive heart failure) ([place]) 06/24/2018   Acute osteomyelitis of toe, left ([place]) 09/05/2016   Amputation of right great toe ([place]) 12/22/2018   Anxiety    Cellulitis of foot, right 12/11/2018   COMMON MIGRAINE 10/07/2010   Decreased visual acuity 11/10/2016  Depression 12/18/2012   Diabetes mellitus type II, uncontrolled ([place]) 10/07/2010   Qualifier: Diagnosis of  By: Charlett Blake MD, [place]     Diabetic foot infection ([place]) 08/26/2016   Diabetic infection of right foot ([place])    Disturbance of skin sensation 10/07/2010   Gangrene of right foot (HCC)    Heart murmur    History of kidney stones    "years ago"   Hyperlipidemia 12/06/2010   Hypertension    Lipoma of abdominal wall 10/05/2016   Overweight(278.02) 12/06/2010   PERIPHERAL NEUROPATHY, FEET 10/07/2010   PVD (peripheral vascular disease) ([place]) 01/21/2012   RESTLESS LEG SYNDROME 10/25/2010   Stroke ([place]) 2014, 2017   most recently in 2/17 - intracerebral hemorrhage    Family History  Problem Relation Age of Onset   Arthritis Mother    Stroke Brother        previous smoker   Alcohol abuse Brother        in remission   Leukemia Brother    Diabetes Paternal Grandmother    Healthy Son     Past Surgical History:  Procedure Laterality Date   AMPUTATION Right 12/13/2018   Procedure: AMPUTATION RIGHT GREAT TOE, LOCAL RELOCATION OF TISSUE FOR WOUND CLOSURE 9cm x 3cm, VAC APPLICATION;  Surgeon: [name], MD;  Location: [facility];  Service: Orthopedics;  Laterality: Right;   AMPUTATION Right 12/31/2018   Procedure: RIGHT BELOW KNEE AMPUTATION;  Surgeon:  [name], MD;  Location: [facility];  Service: Orthopedics;  Laterality: Right;   AMPUTATION TOE Left 09/09/2016   Procedure: AMPUTATION OF LEFT GREAT TOE;  Surgeon: Milly Jakob, MD;  Location: [facility];  Service: Orthopedics;  Laterality: Left;   CARDIAC CATHETERIZATION  06/24/2018   CORONARY ARTERY BYPASS GRAFT N/A 06/28/2018   Procedure: CORONARY ARTERY BYPASS GRAFTING (CABG) times  four, using left internal mammary artery, endoscopically harvested right saphenous vein, and harvested left radial artery;  Surgeon: [name], MD;  Location: [facility];  Service: Open Heart Surgery;  Laterality: N/A;   ENDOVEIN HARVEST OF GREATER SAPHENOUS VEIN Right 06/28/2018   Procedure: ENDOVEIN HARVEST OF GREATER SAPHENOUS VEIN;  Surgeon: [name], MD;  Location: [facility];  Service: Open Heart Surgery;  Laterality: Right;   LEFT HEART CATH AND CORONARY ANGIOGRAPHY N/A 06/24/2018   Procedure: LEFT HEART CATH AND CORONARY ANGIOGRAPHY;  Surgeon: Troy Sine, MD;  Location: [facility];  Service: Cardiovascular;  Laterality: N/A;   RADIAL ARTERY HARVEST Left 06/28/2018   Procedure: RADIAL ARTERY HARVEST;  Surgeon: [name], MD;  Location: [facility];  Service: Open Heart Surgery;  Laterality: Left;   TEE WITHOUT CARDIOVERSION N/A 06/28/2018   Procedure: TRANSESOPHAGEAL ECHOCARDIOGRAM (TEE);  Surgeon: [name], MD;  Location: [facility];  Service: Open Heart Surgery;  Laterality: N/A;   WISDOM TOOTH EXTRACTION     Social History   Occupational History   Occupation: Glass blower/designer  Tobacco Use   Smoking status: Former Smoker    Packs/day: 0.50    Quit date: 09/08/2005    Years since quitting: 13.9   Smokeless tobacco: Never Used  Substance and Sexual Activity   Alcohol use: No    Alcohol/week: 0.0 standard drinks   Drug use: Not Currently   Sexual activity: Yes    Partners: Male    Birth control/protection: None

## 2019-08-11 ENCOUNTER — Ambulatory Visit: Payer: BC Managed Care – PPO | Admitting: Orthopedic Surgery

## 2019-08-15 ENCOUNTER — Telehealth: Payer: Self-pay | Admitting: Orthopedic Surgery

## 2019-08-15 ENCOUNTER — Other Ambulatory Visit: Payer: Self-pay | Admitting: Orthopedic Surgery

## 2019-08-15 ENCOUNTER — Other Ambulatory Visit: Payer: Self-pay | Admitting: Physician Assistant

## 2019-08-15 MED ORDER — DOXYCYCLINE HYCLATE 100 MG PO TABS: 100.0000 mg | ORAL_TABLET | Freq: Two times a day (BID) | ORAL | 0 refills | Status: DC

## 2019-08-15 NOTE — Telephone Encounter (Signed)
Pt was in the office 08/08/19 hx BKA on right and left heel ulcer. She is concerned that the heel ulcer is so slow to heal draining and slight odor.  Patient  is asking for ABX. Please advise.

## 2019-08-15 NOTE — Telephone Encounter (Signed)
Patient has appt for 08/22/2019 in our office. I will call and notify patient that her antibiotic has been written.

## 2019-08-15 NOTE — Telephone Encounter (Signed)
Please advise [name], Did you discuss patient using antibiotics on last visit? What would you like for me to send in? Thank you

## 2019-08-15 NOTE — Telephone Encounter (Signed)
Patient requesting call back from Dr. [name] Given nurse. Patient asking for antibiotics to be called in at patient last pharmacy on file. Patient phone number is [phone]

## 2019-08-15 NOTE — Telephone Encounter (Signed)
Patient called left voicemail message stating her foot is not healing like it should and asked if she can get an antibiotic called in for her foot. The number to contact patient is [phone]

## 2019-08-15 NOTE — Telephone Encounter (Signed)
Order written for doxycycline.  Have her follow-up in 1 to 2 weeks to evaluate progress with using the antibiotic.

## 2019-08-16 DIAGNOSIS — S88111A Complete traumatic amputation at level between knee and ankle, right lower leg, initial encounter: Secondary | ICD-10-CM | POA: Diagnosis not present

## 2019-08-16 DIAGNOSIS — I5032 Chronic diastolic (congestive) heart failure: Secondary | ICD-10-CM | POA: Diagnosis not present

## 2019-08-22 ENCOUNTER — Ambulatory Visit (INDEPENDENT_AMBULATORY_CARE_PROVIDER_SITE_OTHER): Payer: BC Managed Care – PPO | Admitting: Orthopedic Surgery

## 2019-08-22 ENCOUNTER — Other Ambulatory Visit: Payer: Self-pay

## 2019-08-22 ENCOUNTER — Encounter: Payer: Self-pay | Admitting: Orthopedic Surgery

## 2019-08-22 VITALS — Ht 62.0 in | Wt 150.0 lb

## 2019-08-22 DIAGNOSIS — E43 Unspecified severe protein-calorie malnutrition: Secondary | ICD-10-CM

## 2019-08-22 DIAGNOSIS — Z89511 Acquired absence of right leg below knee: Secondary | ICD-10-CM | POA: Diagnosis not present

## 2019-08-22 DIAGNOSIS — L97424 Non-pressure chronic ulcer of left heel and midfoot with necrosis of bone: Secondary | ICD-10-CM | POA: Diagnosis not present

## 2019-08-22 DIAGNOSIS — M86272 Subacute osteomyelitis, left ankle and foot: Secondary | ICD-10-CM

## 2019-08-22 NOTE — Progress Notes (Signed)
Office Visit Note   Patient: [name]           Date of Birth: Sep 18, 1965           MRN: [id number] Visit Date: 08/22/2019              Requested by: Mosie Lukes, MD [address] [place],  [place] [address] PCP: Mosie Lukes, MD  Chief Complaint  Patient presents with  . Right Leg - Follow-up    12/31/18 right BKA   . Left Foot - Follow-up    Heel ulcer       HPI: Patient is a 53 year old woman who is seen in follow-up for left heel ulcer.  Patient complains of significant pain odor and drainage.  She has been on doxycycline for 2 weeks.  She is status post a right transtibial amputation.  Patient is working with Museum/gallery curator for the prosthesis on the right leg.  Patient complains of nausea and vomiting.  Assessment & Plan: Visit Diagnoses:  1. Acquired absence of right leg below knee (HCC)   2. Subacute osteomyelitis, left ankle and foot ([place])   3. Lower limb ulcer, heel or midfoot, left, with necrosis of bone ([place])   4. Severe protein-calorie malnutrition ([place])     Plan: With exposed bone cellulitis necrotic wound and purulent drainage patient best option is to proceed with a left transtibial amputation.  Recommended proceeding with surgery this week patient states she would like to proceed on Friday risks and benefits were discussed including risk of the wound not healing and need for additional surgery.  Follow-Up Instructions: Return in about 1 week (around 08/29/2019).   Ortho Exam  Patient is alert, oriented, no adenopathy, well-dressed, normal affect, normal respiratory effort. Examination patient's heel is extremely tender to palpation there is cellulitis there is foul-smelling purulent drainage.  The ulcer probes to bone.  She does have a palpable dorsalis pedis pulse the Doppler was used and she has a biphasic dorsalis pedis and posterior tibial pulse.   Imaging: No results found. No images are attached to the encounter.  Labs: Lab  Results  Component Value Date   HGBA1C 10.5 (H) 12/12/2018   HGBA1C 11.1 (H) 06/26/2018   HGBA1C 9.5 (H) 11/17/2017   ESRSEDRATE 130 (H) 12/12/2018   ESRSEDRATE 115 (H) 06/24/2018   ESRSEDRATE 107 (H) 09/04/2016   CRP 28.2 (H) 12/12/2018   CRP 7.2 (H) 09/04/2016   CRP 0.7 03/21/2015   LABURIC 3.2 06/13/2014   LABURIC 3.1 02/09/2014   REPTSTATUS 12/18/2018 FINAL 12/13/2018   GRAMSTAIN  12/13/2018    FEW WBC PRESENT,BOTH PMN AND MONONUCLEAR NO ORGANISMS SEEN    CULT  12/13/2018    FEW STAPHYLOCOCCUS SIMULANS CRITICAL RESULT CALLED TO, READ BACK BY AND VERIFIED WITH: RN N FELIX [id number] AT 53 AM BY CM NO ANAEROBES ISOLATED Performed at [facility], [place] [address] [place], [place] [address]    LABORGA STAPHYLOCOCCUS SIMULANS 12/13/2018     Lab Results  Component Value Date   ALBUMIN 1.8 (L) 01/06/2019   ALBUMIN 2.4 (L) 12/11/2018   ALBUMIN 3.4 (L) 07/20/2018   PREALBUMIN 12.7 (L) 09/04/2016   LABURIC 3.2 06/13/2014   LABURIC 3.1 02/09/2014    Lab Results  Component Value Date   MG 2.1 12/16/2018   MG 2.8 (H) 06/29/2018   MG 3.0 (H) 06/29/2018   No results found for: [facility]  Lab Results  Component Value Date  PREALBUMIN 12.7 (L) 09/04/2016   CBC EXTENDED Latest Ref Rng & Units 01/14/2019 01/10/2019 01/06/2019  WBC 4.0 - 10.5 K/uL 9.1 12.1(H) 10.1  RBC 3.87 - 5.11 MIL/uL 3.34(L) 3.59(L) 3.24(L)  HGB 12.0 - 15.0 g/dL 8.8(L) 9.4(L) 8.7(L)  HCT 36.0 - 46.0 % 27.8(L) 29.7(L) 26.6(L)  PLT 150 - 400 K/uL 613(H) 699(H) 566(H)  NEUTROABS 1.7 - 7.7 K/uL - 8.7(H) 7.0  LYMPHSABS 0.7 - 4.0 K/uL - 2.0 1.5     Body mass index is 27.44 kg/m.  Orders:  No orders of the defined types were placed in this encounter.  No orders of the defined types were placed in this encounter.    Procedures: No procedures performed  Clinical Data: No additional findings.  ROS:  All other systems negative, except as noted in the HPI. Review of Systems  Objective: Vital  Signs: Ht 5\' 2"  (1.575 m)   Wt 150 lb (68 kg)   LMP 02/12/2011 (Exact Date)   BMI 27.44 kg/m   Specialty Comments:  No specialty comments available.  PMFS History: Patient Active Problem List   Diagnosis Date Noted  . Pressure injury of left heel, unstageable ([place]) 02/09/2019  . Hypoglycemia   . Diabetes mellitus type 2 in nonobese (HCC)   . Labile blood pressure   . Labile blood glucose   . Phantom limb pain ([place])   . CKD (chronic kidney disease), stage III   . Hypothyroidism   . Essential hypertension   . Chronic diastolic congestive heart failure ([place])   . Coronary artery disease involving coronary bypass graft of native heart without angina pectoris   . Poorly controlled type 2 diabetes mellitus with peripheral neuropathy ([place])   . Acute on chronic anemia   . History of below knee amputation, right ([place]) 12/31/2018  . S/P CABG x 3 06/28/2018  . Coronary artery disease involving native coronary artery of native heart with angina pectoris ([place]) 06/25/2018  . Type 2 diabetes mellitus with vascular disease ([place]) 06/24/2018  . Acute on chronic diastolic CHF (congestive heart failure) ([place]) 06/24/2018  . Non-ST elevation (NSTEMI) myocardial infarction ([place]) 06/24/2018  . Chest pain 06/23/2018  . Glaucoma 09/17/2017  . Diabetic retinopathy ([place]) 09/17/2017  . Otitis media 11/10/2016  . Decreased visual acuity 11/10/2016  . Spinal stenosis, lumbar region with neurogenic claudication 10/22/2016  . Other spondylosis with radiculopathy, lumbar region 10/13/2016  . Lipoma of abdominal wall 10/05/2016  . Severe protein-calorie malnutrition ([place]) 09/04/2016  . Anemia of chronic disease 09/04/2016  . Diabetic polyneuropathy associated with type 2 diabetes mellitus ([place]) 05/07/2016  . Orthostatic hypotension 05/07/2016  . Cerebrovascular disease 04/27/2016  . N&V (nausea and vomiting) 04/01/2016  . ICH (intracerebral hemorrhage) ([place]) 03/30/2016  . Visit for preventive health  examination 05/21/2015  . Breast cancer screening 05/21/2015  . Arm lesion 07/02/2014  . Pain in limb 01/13/2014  . Neuropathic pain of both legs 01/13/2014  . Hip pain 01/12/2014  . Allergic rhinitis 03/10/2013  . Back pain 03/08/2013  . Depression 12/18/2012  . PVD (peripheral vascular disease) ([place]) 01/21/2012  . Hyperlipidemia 12/06/2010  . Overweight(278.02) 12/06/2010  . RESTLESS LEG SYNDROME 10/25/2010  . Migraine without aura 10/07/2010  . Hereditary and idiopathic peripheral neuropathy 10/07/2010  . Essential hypertension, benign 10/07/2010  . DISTURBANCE OF SKIN SENSATION 10/07/2010  . HEART MURMUR, HX OF 10/07/2010  . NEPHROLITHIASIS, HX OF 10/07/2010   Past Medical History:  Diagnosis Date  . Abscess of great toe, right   .  Acute on chronic diastolic CHF (congestive heart failure) ([place]) 06/24/2018  . Acute osteomyelitis of toe, left ([place]) 09/05/2016  . Amputation of right great toe ([place]) 12/22/2018  . Anxiety   . Cellulitis of foot, right 12/11/2018  . COMMON MIGRAINE 10/07/2010  . Decreased visual acuity 11/10/2016  . Depression 12/18/2012  . Diabetes mellitus type II, uncontrolled ([place]) 10/07/2010   Qualifier: Diagnosis of  By: Charlett Blake MD, [name]    . Diabetic foot infection ([place]) 08/26/2016  . Diabetic infection of right foot ([place])   . Disturbance of skin sensation 10/07/2010  . Gangrene of right foot ([place])   . Heart murmur   . History of kidney stones    "years ago"  . Hyperlipidemia 12/06/2010  . Hypertension   . Lipoma of abdominal wall 10/05/2016  . Overweight(278.02) 12/06/2010  . PERIPHERAL NEUROPATHY, FEET 10/07/2010  . PVD (peripheral vascular disease) ([place]) 01/21/2012  . RESTLESS LEG SYNDROME 10/25/2010  . Stroke [facility]) 2014, 2017   most recently in 2/17 - intracerebral hemorrhage    Family History  Problem Relation Age of Onset  . Arthritis Mother   . Stroke Brother        previous smoker  . Alcohol abuse Brother        in remission  . Leukemia Brother    . Diabetes Paternal Grandmother   . Healthy Son     Past Surgical History:  Procedure Laterality Date  . AMPUTATION Right 12/13/2018   Procedure: AMPUTATION RIGHT GREAT TOE, LOCAL RELOCATION OF TISSUE FOR WOUND CLOSURE 9cm x 3cm, VAC APPLICATION;  Surgeon: [name], MD;  Location: [facility];  Service: Orthopedics;  Laterality: Right;  . AMPUTATION Right 12/31/2018   Procedure: RIGHT BELOW KNEE AMPUTATION;  Surgeon: [name], MD;  Location: [facility];  Service: Orthopedics;  Laterality: Right;  . AMPUTATION TOE Left 09/09/2016   Procedure: AMPUTATION OF LEFT GREAT TOE;  Surgeon: Milly Jakob, MD;  Location: [facility];  Service: Orthopedics;  Laterality: Left;  . CARDIAC CATHETERIZATION  06/24/2018  . CORONARY ARTERY BYPASS GRAFT N/A 06/28/2018   Procedure: CORONARY ARTERY BYPASS GRAFTING (CABG) times  four, using left internal mammary artery, endoscopically harvested right saphenous vein, and harvested left radial artery;  Surgeon: [name], MD;  Location: [facility];  Service: Open Heart Surgery;  Laterality: N/A;  . ENDOVEIN HARVEST OF GREATER SAPHENOUS VEIN Right 06/28/2018   Procedure: ENDOVEIN HARVEST OF GREATER SAPHENOUS VEIN;  Surgeon: [name], MD;  Location: [facility];  Service: Open Heart Surgery;  Laterality: Right;  . LEFT HEART CATH AND CORONARY ANGIOGRAPHY N/A 06/24/2018   Procedure: LEFT HEART CATH AND CORONARY ANGIOGRAPHY;  Surgeon: Troy Sine, MD;  Location: [facility];  Service: Cardiovascular;  Laterality: N/A;  . RADIAL ARTERY HARVEST Left 06/28/2018   Procedure: RADIAL ARTERY HARVEST;  Surgeon: [name], MD;  Location: [facility];  Service: Open Heart Surgery;  Laterality: Left;  . TEE WITHOUT CARDIOVERSION N/A 06/28/2018   Procedure: TRANSESOPHAGEAL ECHOCARDIOGRAM (TEE);  Surgeon: [name], MD;  Location: [facility];  Service: Open Heart Surgery;  Laterality: N/A;  . WISDOM TOOTH EXTRACTION     Social  History   Occupational History  . Occupation: Glass blower/designer  Tobacco Use  . Smoking status: Former Smoker    Packs/day: 0.50    Quit date: 09/08/2005    Years since quitting: 13.9  . Smokeless tobacco: Never Used  Substance and Sexual Activity  .  Alcohol use: No    Alcohol/week: 0.0 standard drinks  . Drug use: Not Currently  . Sexual activity: Yes    Partners: Male    Birth control/protection: None

## 2019-08-23 ENCOUNTER — Other Ambulatory Visit: Payer: Self-pay | Admitting: Physician Assistant

## 2019-08-23 ENCOUNTER — Telehealth: Payer: Self-pay

## 2019-08-23 ENCOUNTER — Other Ambulatory Visit (HOSPITAL_COMMUNITY)
Admission: RE | Admit: 2019-08-23 | Discharge: 2019-08-23 | Disposition: A | Discharge status: Home / Self Care | Payer: BC Managed Care – PPO | Source: Ambulatory Visit | Attending: Orthopedic Surgery | Admitting: Orthopedic Surgery

## 2019-08-23 DIAGNOSIS — Z01812 Encounter for preprocedural laboratory examination: Secondary | ICD-10-CM | POA: Insufficient documentation

## 2019-08-23 DIAGNOSIS — Z20828 Contact with and (suspected) exposure to other viral communicable diseases: Secondary | ICD-10-CM | POA: Diagnosis not present

## 2019-08-23 NOTE — Telephone Encounter (Signed)
Please see message below. Do you want me to refer pt to the wound center?

## 2019-08-23 NOTE — Telephone Encounter (Signed)
Patient would like a call back from Dr. [name] Given to discuss seeing a wound specialist before having surgery, which is scheduled for Friday, 08/26/2019.  Cb# [id number].  Please advise.  Thank you.

## 2019-08-23 NOTE — Telephone Encounter (Signed)
See if she can be seen by [facility]  before friday

## 2019-08-24 ENCOUNTER — Other Ambulatory Visit: Payer: Self-pay

## 2019-08-24 DIAGNOSIS — M86272 Subacute osteomyelitis, left ankle and foot: Secondary | ICD-10-CM

## 2019-08-24 LAB — NOVEL CORONAVIRUS, NAA (HOSP ORDER, SEND-OUT TO REF LAB; TAT 18-24 HRS): SARS-CoV-2, NAA: NOT DETECTED

## 2019-08-24 NOTE — Telephone Encounter (Signed)
I called this pt and advised of the referral for the wound care center. She has surgery sch for Friday and he was wanting to get this referral this afternoon or tomorrow at either location if at all possible. Can you please call the pt and let her know what we can do? Thanks!

## 2019-08-25 ENCOUNTER — Encounter (HOSPITAL_COMMUNITY): Payer: Self-pay | Admitting: Vascular Surgery

## 2019-08-25 ENCOUNTER — Encounter (HOSPITAL_BASED_OUTPATIENT_CLINIC_OR_DEPARTMENT_OTHER): Payer: BC Managed Care – PPO | Attending: Internal Medicine | Admitting: Internal Medicine

## 2019-08-25 ENCOUNTER — Other Ambulatory Visit: Payer: Self-pay

## 2019-08-25 ENCOUNTER — Encounter (HOSPITAL_COMMUNITY): Payer: Self-pay | Admitting: Orthopedic Surgery

## 2019-08-25 DIAGNOSIS — Z8673 Personal history of transient ischemic attack (TIA), and cerebral infarction without residual deficits: Secondary | ICD-10-CM | POA: Diagnosis not present

## 2019-08-25 DIAGNOSIS — L97428 Non-pressure chronic ulcer of left heel and midfoot with other specified severity: Secondary | ICD-10-CM | POA: Diagnosis not present

## 2019-08-25 DIAGNOSIS — Z809 Family history of malignant neoplasm, unspecified: Secondary | ICD-10-CM | POA: Insufficient documentation

## 2019-08-25 DIAGNOSIS — Z833 Family history of diabetes mellitus: Secondary | ICD-10-CM | POA: Insufficient documentation

## 2019-08-25 DIAGNOSIS — E1140 Type 2 diabetes mellitus with diabetic neuropathy, unspecified: Secondary | ICD-10-CM | POA: Diagnosis not present

## 2019-08-25 DIAGNOSIS — Z880 Allergy status to penicillin: Secondary | ICD-10-CM | POA: Insufficient documentation

## 2019-08-25 DIAGNOSIS — I130 Hypertensive heart and chronic kidney disease with heart failure and stage 1 through stage 4 chronic kidney disease, or unspecified chronic kidney disease: Secondary | ICD-10-CM | POA: Diagnosis not present

## 2019-08-25 DIAGNOSIS — K219 Gastro-esophageal reflux disease without esophagitis: Secondary | ICD-10-CM | POA: Diagnosis not present

## 2019-08-25 DIAGNOSIS — Z885 Allergy status to narcotic agent status: Secondary | ICD-10-CM | POA: Insufficient documentation

## 2019-08-25 DIAGNOSIS — E11628 Type 2 diabetes mellitus with other skin complications: Secondary | ICD-10-CM | POA: Diagnosis not present

## 2019-08-25 DIAGNOSIS — L02612 Cutaneous abscess of left foot: Secondary | ICD-10-CM | POA: Insufficient documentation

## 2019-08-25 DIAGNOSIS — E1122 Type 2 diabetes mellitus with diabetic chronic kidney disease: Secondary | ICD-10-CM | POA: Insufficient documentation

## 2019-08-25 DIAGNOSIS — I5030 Unspecified diastolic (congestive) heart failure: Secondary | ICD-10-CM | POA: Diagnosis not present

## 2019-08-25 DIAGNOSIS — Z888 Allergy status to other drugs, medicaments and biological substances status: Secondary | ICD-10-CM | POA: Insufficient documentation

## 2019-08-25 DIAGNOSIS — L89629 Pressure ulcer of left heel, unspecified stage: Secondary | ICD-10-CM | POA: Diagnosis not present

## 2019-08-25 DIAGNOSIS — E11621 Type 2 diabetes mellitus with foot ulcer: Secondary | ICD-10-CM | POA: Insufficient documentation

## 2019-08-25 DIAGNOSIS — L03116 Cellulitis of left lower limb: Secondary | ICD-10-CM | POA: Diagnosis not present

## 2019-08-25 DIAGNOSIS — Z794 Long term (current) use of insulin: Secondary | ICD-10-CM | POA: Diagnosis not present

## 2019-08-25 DIAGNOSIS — N183 Chronic kidney disease, stage 3 unspecified: Secondary | ICD-10-CM | POA: Diagnosis not present

## 2019-08-25 NOTE — Telephone Encounter (Signed)
I spoke with [name] with [place] at [facility] and she is going to go and talk to one of the providers and see if can try to get pt in. She will contact me to let me know.

## 2019-08-25 NOTE — Progress Notes (Signed)
[name], [name] ([id number]) Visit Report for 08/25/2019 Chief Complaint Document Details Patient Name: Date of Service: [name], [name] 08/25/2019 2:45 PM Medical Record [id number] Patient Account Number: [id number] Date of Birth/Sex: Treating RN: 1966/05/04 (53 y.o. F) Primary Care Provider: Penni Homans Other Clinician: Referring Provider: Treating Provider/Extender:Robson, Kirby Crigler, Lewis Moccasin in Treatment: 0 Information Obtained from: Patient Chief Complaint Patient is here for review of the wound on her right medial lower leg over the last 3 weeks 08/25/2019; patient returns to clinic for a second opinion on a diabetic foot ulcer on the left plantar heel Electronic Signature(s) Signed: 08/25/2019 5:01:00 PM By: [name] MD Entered By: [name] on 08/25/2019 15:32:29 -------------------------------------------------------------------------------- HPI Details Patient Name: Date of Service: [name] 08/25/2019 2:45 PM Medical Record [id number] Patient Account Number: [id number] Date of Birth/Sex: Treating RN: [birth year] (53 y.o. F) Primary Care Provider: Penni Homans Other Clinician: Referring Provider: Treating Provider/Extender:Robson, Kirby Crigler, Lewis Moccasin in Treatment: 0 History of Present Illness HPI Description: 12/14/15; this is a patient who is a type II diabetic and insulin and metformin. She has known diabetic neuropathy but no history of PAD. She states that 3 weeks ago she was working with her husband and felt a sharp pain on her right lateral lower leg and looked down and noticed that she had a wound. She thinks that she may have been hit by a stone but is unsure. She seen by her primary doctor who was concerned about underlying infection. Keflex was prescribed although I don't think there was a culture done. She had a plain x-ray of the area on 3/14 this showed no evidence of fracture or other bone lesion.  Soft tissues were unremarkable. She has gone on to have an MRI showed an open wound on the lateral aspect of the upper ankle. There was enhancing granulation tissue and mild cellulitis and mild adjacent myositis but no osteomyelitis or septic arthritis. The patient has a history of being in this clinic apparently for 5 years ago for what was felt to be a brown recluse spider bite on her left leg. No other wound history. ABIs in this clinic were 1.14 on the left 1.11 on the right 12/21/15; patient arrives with her wound and slightly better condition but still a tightly adherent surface slough which undergoes a difficult surgical debridement which she tolerates poorly. 12/28/15; continued nonviable surface, difficult debridement 01/04/16; better debridement today with injectable lidocaine. 01/11/16; patient arrived today with a rim of erythema around this wound circumferentially measuring perhaps A centimeter. Nevertheless this is concerning for coexistent cellulitis. The base of the wound does not look ominous still eschar around the wound and slough on the surface. This is a deep wound which approaches bone. I did not debridement this today due to concern of infection 01/18/16; the rim of erythema around this wound has resolved. She can finish the doxycycline. The culture I did last week was negative. Her venous reflux study showed reflux in the right common femoral and greater saphenous vein although this wound is on the lateral aspect of her right leg. The left leg study showed reflux in the greater saphenous and saphenofemoral junction. 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 02/05/2016 -- the patient says over the last 3 days she felt very feverish had a low-grade temperature and had a lot of pain in the lateral part of her  leg and hence was called in to have her wound review. The  nurse who took the dressing down and noted that there was a turgid swelling in the posterior aspect of the wound. This was very tender. And hence I was asked to review this wound. 02/08/16; put an Apligraf on her 2 weeks ago. I note that she saw my partner Dr. Con Memos 3 days ago and a seroma was lanced without evidence of infection. Patient relates low-grade fevers. She has not felt well however does not appear to be overtly septic. She saw her primary physician recently and was given tramadol IM oral hydrocodone. She is developed intense itching. I think at some point she had a steroid injection but no antibiotics. 02/12/16; seen in follow-up of the infection surrounding the wound today. I had put her on Augmentin and doxycycline. Inflammatory culture reveals enterococcus [Augmentin resistant] but quinolone sensitive. And also staph aureus without current sensitivities. She feels systemically better eating and drinking well blood sugars are under control 02/15/16; seen in follow-up of the patient's extensive right medial lower leg/ankle wound. Culture this clue Enterobacter cloacae and methicillin sensitive staph aureus is a turns out. She is on Cipro and Doxy. Doxy is not the antibiotic of choice for a methicillin sensitive staph wound however this is improved to the point where I don't want to make any antibiotic changes she has another 3 days worth of antibiotics. The patient does not have any overt arterial issues. Arterial studies in 2015 were negative nevertheless we will repeat these. She has had venous reflux studies and I'm going to vascular surgery to review these. She has known reflux in the common femoral and greater saphenous vein as well as the saphenofemoral junction 02/29/16 have not seen this patient in 2 weeks. She is completed antibiotics. She was apparently debridement last week. The overall wound area has expanded quite a bit. In view of this I have ordered arterial studies  which eventually were done this morning. I am also going to have vascular surgery review the reflux studies that we had done several weeks ago. I think much of the deterioration had to do with significant surrounding cellulitis which we treated with outpatient antibiotics. Nevertheless if this continues to deteriorate it will warrant a biopsy. 02/19/16; the patient has completed her evaluation by Dr. Oneida Alar in [facility]. Although she did have evidence of reflux in the right common femoral and greater saphenous vein, it is commented that her vein diameter was only 3-4 mm in the superficial system and therefore she is not felt to be a candidate for laser ablation. It was also noted that she had normal ABIs bilaterally 1.1 on the right one on the left triphasic waveforms throughout the bilateral lower extremities with no obvious stenosis on duplex exam her right TBI was 0.6 0.9 on the left. She was not felt to have an arterial issue. Dr. Oneida Alar recommended [employer] and a 10 day course of Levaquin. Of note she had already completed antibiotics that I had given her earlier this month. Was some confusion about whether we should be looking at the wounds today. According to the patient and her husband Dr. Oneida Alar it said not to let us remove the Unna wraps he put on. In fact his note said that her follow-up would only be on "an as-needed basis." When she came here she was almost reluctant allow [name] with to remove the wrap until I assured her that we could place an [place] boot 03/14/16  patient arrives with a wound having a slight odor. Copious amounts of surface slough debridement. 03/21/16 patient's large wound continues to make gradual improvement in terms of the amount of fibrinous adherent slough and nonviable subcutaneous tissue. Still requiring is much of a debridement as the patient can stand. There is no evidence of ischemia. 03/28/16; superior part of this large wound on her right lateral ankle is  debridement again of adherent slough and nonviable subcutaneous tissue although we are making progress. 04/04/16; the patient returns today having been hospitalized from 7/23 through 7/25. She suffered a right frontal parietal intracerebral hemorrhage felt secondary to hypertensive disease. Also noted that her hemoglobin A1c was 12.6. 2-D echo showed an EF of 60-65% no source of emboli. Carotid Doppler showed no significant stenosis. She has been left with some clumsiness of her left hand. She we have been using Iodoflex under a Profore wrap to a large lateral right lower leg wound 05/01/16; the patient is made a good recovery apparently from her stroke and is now back at work. We applied to further Apligraf's to the area on the right lateral leg. We are limited by the fact she had had a previous Apligraf that the beginning of May. Her substantial wound is considerably better however she apparently over the last week or 2 has developed a small satellite lesion superiorly 05/16/16; markedly improved wound using Hydrofera Blue coming back in one time for a wrap change due to drainage before being seen next week 05/23/16; continues marked improvement using Hydrofera Blue. There is no drainage and she doesn't need to come back for a rapid midweek. She complains of low back pain radiating first into the left buttock and then into the left thigh in the more recently into the right buttock she was in the ER and has been seen by sports medicine has an MRI booked for next week. She has not been systemically unwell no fever no chills 05/29/16; continues with progressive improvement epithelialization on wounds on the right lateral leg. She is using Hydrofera Blue under Profore light wraps which she leaves on all week 06/06/16 only a small area remains of a substantial wound on the right lateral leg using Hydrofera Blue under Profore. I've allowed her to come out of this this week with Hydrofera Blue under foam and  her on compression stockings. 06/12/16; the patient's wound area has completely epithelialized. She has venous insufficiency however the cause of this initially was not really clear. The patient thought it was bug bites. She arrived with very tightly adherent surface slough nonviable subcutaneous tissue and underwent a series of debridement so ultimately complicated by some surrounding infection. She saw Dr. Oneida Alar of vascular surgery and was not felt to require venous ablations her tear all evaluation was within normal limits. She is a type II diabetic with neuropathy READMISSION 08/25/2019 [name] is a now 53 year old woman who is a type II diabetic. She is here for a second opinion about an infected diabetic foot ulcer. We had her in clinic here in 2017 for a fairly substantial wound on her right lateral ankle which healed out after a prolonged number of visits. She tells me everything was fine up until April when she stepped on some toys that her grandchildren and left on the floor. She developed a wound in the right foot and she claims at the same time a wound on her left heel. She was admitted twice in April ultimately ending up with a below-knee amputation by Dr.  Duda for gangrene of the right foot. She states she has been followed for a prolonged period of time for an ulcer on the left heel treated with various topical agents. She was seen by Dr. [name] Given on 12/14 noted to have a foul-smelling purulent drainage coming from the left heel. It was felt a better course of option would be to undergo a left BKA and she is scheduled for that tomorrow morning. She was taking doxycycline but stopped that 2 days ago stating that she did not see the point given the fact she was going to have an amputation. The patient's had arterial studies done in October 2019 this showed an ABI on the right of 1.09 with triphasic waveforms on the left the ABI was 1.07 again with triphasic waveforms. I believe she  was seen by Dr. Oneida Alar in 2017 when we looked at her this clinic and was not felt to have an arterial issue Past medical history. The patient has an extensive cardiac history including 2 bypass surgeries. She has peripheral neuropathy diastolic heart failure and hypertension. Chronic kidney disease stage III, labile blood pressure, glaucoma, diabetic retinopathy, spinal stenosis, Electronic Signature(s) Signed: 08/25/2019 5:01:00 PM By: [name] MD Entered By: [name] on 08/25/2019 15:37:47 -------------------------------------------------------------------------------- Physical Exam Details Patient Name: Date of Service: [name] 08/25/2019 2:45 PM Medical Record [id number] Patient Account Number: [id number] Date of Birth/Sex: Treating RN: [birth year] (53 y.o. F) Primary Care Provider: Penni Homans Other Clinician: Referring Provider: Treating Provider/Extender:Robson, Kirby Crigler, Lewis Moccasin in Treatment: 0 Constitutional Patient is hypertensive.. Pulse regular and within target range for patient.Marland Kitchen Respirations regular, non-labored and within target range.. Patient is febrile today.. Patient looks somewhat ill but alert and conversational. Eyes Conjunctivae clear. No discharge.no icterus. Respiratory work of breathing is normal. Cardiovascular Pedal pulses palpable and strong on the left.. Neurological Diabetic neuropathy. Psychiatric appears at normal baseline. Notes Wound exam; the patient has a necrotic wound on the tip of her left heel. There is purulent drainage extending into the plantar heel. I did not probe the wound there is erythema extending greater than 2 cm from the wound circumference especially medially I have marked these areas. Very purulent drainage with a foul odor. Electronic Signature(s) Signed: 08/25/2019 5:01:00 PM By: [name] MD Entered By: [name] on 08/25/2019  15:39:25 -------------------------------------------------------------------------------- Physician Orders Details Patient Name: Date of Service: [name] 08/25/2019 2:45 PM Medical Record [id number] Patient Account Number: [id number] Date of Birth/Sex: Treating RN: [birth year] (53 y.o. Debby Bud Primary Care Provider: Penni Homans Other Clinician: Referring Provider: Treating Provider/Extender:Robson, Kirby Crigler, Lewis Moccasin in Treatment: 0 Verbal / Phone Orders: No Diagnosis Coding ICD-10 Coding Code Description E11.621 Type 2 diabetes mellitus with foot ulcer Discharge From [facility] Discharge from [facility] [place] [facility] - patient to follow up with Dr. [name] Given or follow up with ED. Primary Wound Dressing Wound #4 Left Calcaneus Calcium Alginate with Silver Secondary Dressing Wound #4 Left Calcaneus Kerlix/Rolled Gauze Dry Gauze Heel Cup Additional Orders / Instructions Other: - patient to make a decision whether to have left foot amputation or attempt to save foot go through ED for evaluation of IV antibiotics, imaging of left heel, with possibility of OR debridement of heel. Patient Medications Allergies: hydrocodone, penicillin, Lyrica, morphine, tramadol Notifications Medication Indication Start End lidocaine DOSE topical 4 % gel - gel topical applied only in clinic prior to debridement by MD. Electronic Signature(s) Signed: 08/25/2019 5:01:00 PM By: [name] MD Signed: 08/25/2019 5:34:18 PM By:  Deaton, Bobbi Entered By: Deon Pilling on 08/25/2019 15:27:12 -------------------------------------------------------------------------------- Prescription 08/25/2019 Patient Name: [name]. Provider: [name] MD Date of Birth: 1965-11-18 NPI#: [id number] Sex: F DEA#: [id number] Phone #: [phone] License #: [id number] Patient Address: [address] [place], [place] [address] [place], [place] [address] [phone] Allergies hydrocodone Reaction: rash Severity: Moderate penicillin Reaction: hives Lyrica morphine Reaction: nausea and vomiting tramadol Reaction: nausea and vomiting Medication Medication: Route: Strength: Form: lidocaine 4 % topical gel topical 4% gel Class: TOPICAL LOCAL ANESTHETICS Dose: Frequency / Time: Indication: gel topical applied only in clinic prior to debridement by MD. Number of Refills: Number of Units: 0 Generic Substitution: Start Date: End Date: One Time Use: Substitution Permitted No Note to Pharmacy: Signature(s): Date(s): Electronic Signature(s) Signed: 08/25/2019 5:01:00 PM By: [name] MD Signed: 08/25/2019 5:34:18 PM By: Deon Pilling Entered By: Deon Pilling on 08/25/2019 15:27:13 --------------------------------------------------------------------------------  Problem List Details Patient Name: Date of Service: [name] 08/25/2019 2:45 PM Medical Record [id number] Patient Account Number: [id number] Date of Birth/Sex: Treating RN: Feb 08, 1966 (53 y.o. Helene Shoe, Tammi Klippel Primary Care Provider: Penni Homans Other Clinician: Referring Provider: Treating Provider/Extender:Robson, Kirby Crigler, Lewis Moccasin in Treatment: 0 Active Problems ICD-10 Evaluated Encounter Code Description Active Date Today Diagnosis E11.621 Type 2 diabetes mellitus with foot ulcer 08/25/2019 No Yes L97.428 Non-pressure chronic ulcer of left heel and midfoot 08/25/2019 No Yes with other specified severity L02.612 Cutaneous abscess of left foot 08/25/2019 No Yes L03.116 Cellulitis of left lower limb 08/25/2019 No Yes Inactive Problems Resolved Problems Electronic Signature(s) Signed: 08/25/2019 5:01:00 PM By: [name] MD Entered By: [name] on 08/25/2019  15:31:47 -------------------------------------------------------------------------------- Progress Note Details Patient Name: Date of Service: [name] 08/25/2019 2:45 PM Medical Record [id number] Patient Account Number: [id number] Date of Birth/Sex: Treating RN: 02-23-1966 (53 y.o. F) Primary Care Provider: Penni Homans Other Clinician: Referring Provider: Treating Provider/Extender:Robson, Kirby Crigler, Lewis Moccasin in Treatment: 0 Subjective Chief Complaint Information obtained from Patient Patient is here for review of the wound on her right medial lower leg over the last 3 weeks 08/25/2019; patient returns to clinic for a second opinion on a diabetic foot ulcer on the left plantar heel History of Present Illness (HPI) 12/14/15; this is a patient who is a type II diabetic and insulin and metformin. She has known diabetic neuropathy but no history of PAD. She states that 3 weeks ago she was working with her husband and felt a sharp pain on her right lateral lower leg and looked down and noticed that she had a wound. She thinks that she may have been hit by a stone but is unsure. She seen by her primary doctor who was concerned about underlying infection. Keflex was prescribed although I don't think there was a culture done. She had a plain x-ray of the area on 3/14 this showed no evidence of fracture or other bone lesion. Soft tissues were unremarkable. She has gone on to have an MRI showed an open wound on the lateral aspect of the upper ankle. There was enhancing granulation tissue and mild cellulitis and mild adjacent myositis but no osteomyelitis or septic arthritis. The patient has a history of being in this clinic apparently for 5 years ago for what was felt to be a brown recluse spider bite on her left leg. No other wound history. ABIs in this clinic were 1.14 on the left 1.11 on the right  12/21/15; patient arrives with her wound and slightly better condition but  still a tightly adherent surface slough which undergoes a difficult surgical debridement which she tolerates poorly. 12/28/15; continued nonviable surface, difficult debridement 01/04/16; better debridement today with injectable lidocaine. 01/11/16; patient arrived today with a rim of erythema around this wound circumferentially measuring perhaps A centimeter. Nevertheless this is concerning for coexistent cellulitis. The base of the wound does not look ominous still eschar around the wound and slough on the surface. This is a deep wound which approaches bone. I did not debridement this today due to concern of infection 01/18/16; the rim of erythema around this wound has resolved. She can finish the doxycycline. The culture I did last week was negative. Her venous reflux study showed reflux in the right common femoral and greater saphenous vein although this wound is on the lateral aspect of her right leg. The left leg study showed reflux in the greater saphenous and saphenofemoral junction. 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 02/05/2016 -- the patient says over the last 3 days she felt very feverish had a low-grade temperature and had a lot of pain in the lateral part of her leg and hence was called in to have her wound review. The nurse who took the dressing down and noted that there was a turgid swelling in the posterior aspect of the wound. This was very tender. And hence I was asked to review this wound. 02/08/16; put an Apligraf on her 2 weeks ago. I note that she saw my partner Dr. Con Memos 3 days ago and a seroma was lanced without evidence of infection. Patient relates low-grade fevers. She has not felt well however does not appear to be overtly septic. She saw her primary physician recently and was given tramadol IM oral hydrocodone. She is developed intense itching. I think at  some point she had a steroid injection but no antibiotics. 02/12/16; seen in follow-up of the infection surrounding the wound today. I had put her on Augmentin and doxycycline. Inflammatory culture reveals enterococcus [Augmentin resistant] but quinolone sensitive. And also staph aureus without current sensitivities. She feels systemically better eating and drinking well blood sugars are under control 02/15/16; seen in follow-up of the patient's extensive right medial lower leg/ankle wound. Culture this clue Enterobacter cloacae and methicillin sensitive staph aureus is a turns out. She is on Cipro and Doxy. Doxy is not the antibiotic of choice for a methicillin sensitive staph wound however this is improved to the point where I don't want to make any antibiotic changes she has another 3 days worth of antibiotics. The patient does not have any overt arterial issues. Arterial studies in 2015 were negative nevertheless we will repeat these. She has had venous reflux studies and I'm going to vascular surgery to review these. She has known reflux in the common femoral and greater saphenous vein as well as the saphenofemoral junction 02/29/16 have not seen this patient in 2 weeks. She is completed antibiotics. She was apparently debridement last week. The overall wound area has expanded quite a bit. In view of this I have ordered arterial studies which eventually were done this morning. I am also going to have vascular surgery review the reflux studies that we had done several weeks ago. I think much of the deterioration had to do with significant surrounding cellulitis which we treated with outpatient antibiotics.  Nevertheless if this continues to deteriorate it will warrant a biopsy. 02/19/16; the patient has completed her evaluation by Dr. Oneida Alar in [facility]. Although she did have evidence of reflux in the right common femoral and greater saphenous vein, it is commented that her vein diameter was only  3-4 mm in the superficial system and therefore she is not felt to be a candidate for laser ablation. It was also noted that she had normal ABIs bilaterally 1.1 on the right one on the left triphasic waveforms throughout the bilateral lower extremities with no obvious stenosis on duplex exam her right TBI was 0.6 0.9 on the left. She was not felt to have an arterial issue. Dr. Oneida Alar recommended [employer] and a 10 day course of Levaquin. Of note she had already completed antibiotics that I had given her earlier this month. Was some confusion about whether we should be looking at the wounds today. According to the patient and her husband Dr. Oneida Alar it said not to let us remove the Unna wraps he put on. In fact his note said that her follow-up would only be on "an as-needed basis." When she came here she was almost reluctant allow [name] with to remove the wrap until I assured her that we could place an [place] boot 03/14/16 patient arrives with a wound having a slight odor. Copious amounts of surface slough debridement. 03/21/16 patient's large wound continues to make gradual improvement in terms of the amount of fibrinous adherent slough and nonviable subcutaneous tissue. Still requiring is much of a debridement as the patient can stand. There is no evidence of ischemia. 03/28/16; superior part of this large wound on her right lateral ankle is debridement again of adherent slough and nonviable subcutaneous tissue although we are making progress. 04/04/16; the patient returns today having been hospitalized from 7/23 through 7/25. She suffered a right frontal parietal intracerebral hemorrhage felt secondary to hypertensive disease. Also noted that her hemoglobin A1c was 12.6. 2-D echo showed an EF of 60-65% no source of emboli. Carotid Doppler showed no significant stenosis. She has been left with some clumsiness of her left hand. She we have been using Iodoflex under a Profore wrap to a large lateral right  lower leg wound 05/01/16; the patient is made a good recovery apparently from her stroke and is now back at work. We applied to further Apligraf's to the area on the right lateral leg. We are limited by the fact she had had a previous Apligraf that the beginning of May. Her substantial wound is considerably better however she apparently over the last week or 2 has developed a small satellite lesion superiorly 05/16/16; markedly improved wound using Hydrofera Blue coming back in one time for a wrap change due to drainage before being seen next week 05/23/16; continues marked improvement using Hydrofera Blue. There is no drainage and she doesn't need to come back for a rapid midweek. She complains of low back pain radiating first into the left buttock and then into the left thigh in the more recently into the right buttock she was in the ER and has been seen by sports medicine has an MRI booked for next week. She has not been systemically unwell no fever no chills 05/29/16; continues with progressive improvement epithelialization on wounds on the right lateral leg. She is using Hydrofera Blue under Profore light wraps which she leaves on all week 06/06/16 only a small area remains of a substantial wound on the right lateral leg using  Hydrofera Blue under Profore. I've allowed her to come out of this this week with Hydrofera Blue under foam and her on compression stockings. 06/12/16; the patient's wound area has completely epithelialized. She has venous insufficiency however the cause of this initially was not really clear. The patient thought it was bug bites. She arrived with very tightly adherent surface slough nonviable subcutaneous tissue and underwent a series of debridement so ultimately complicated by some surrounding infection. She saw Dr. Oneida Alar of vascular surgery and was not felt to require venous ablations her tear all evaluation was within normal limits. She is a type II diabetic with  neuropathy READMISSION 08/25/2019 [name] is a now 53 year old woman who is a type II diabetic. She is here for a second opinion about an infected diabetic foot ulcer. We had her in clinic here in 2017 for a fairly substantial wound on her right lateral ankle which healed out after a prolonged number of visits. She tells me everything was fine up until April when she stepped on some toys that her grandchildren and left on the floor. She developed a wound in the right foot and she claims at the same time a wound on her left heel. She was admitted twice in April ultimately ending up with a below-knee amputation by Dr. [name] Given for gangrene of the right foot. She states she has been followed for a prolonged period of time for an ulcer on the left heel treated with various topical agents. She was seen by Dr. [name] Given on 12/14 noted to have a foul-smelling purulent drainage coming from the left heel. It was felt a better course of option would be to undergo a left BKA and she is scheduled for that tomorrow morning. She was taking doxycycline but stopped that 2 days ago stating that she did not see the point given the fact she was going to have an amputation. The patient's had arterial studies done in October 2019 this showed an ABI on the right of 1.09 with triphasic waveforms on the left the ABI was 1.07 again with triphasic waveforms. I believe she was seen by Dr. Oneida Alar in 2017 when we looked at her this clinic and was not felt to have an arterial issue Past medical history. The patient has an extensive cardiac history including 2 bypass surgeries. She has peripheral neuropathy diastolic heart failure and hypertension. Chronic kidney disease stage III, labile blood pressure, glaucoma, diabetic retinopathy, spinal stenosis, Patient History Information obtained from Patient. Allergies hydrocodone (Severity: Moderate, Reaction: rash), penicillin (Reaction: hives), Lyrica, morphine (Reaction:  nausea and vomiting), tramadol (Reaction: nausea and vomiting) Family History Cancer - brother, Diabetes - Paternal Grandparents, Stroke - brother, No family history of Heart Disease, Hereditary Spherocytosis, Hypertension, Kidney Disease, Lung Disease, Seizures, Thyroid Problems, Tuberculosis. Social History Never smoker, Marital Status - Married, Alcohol Use - Never, Drug Use - No History, Caffeine Use - Daily - coffee. Medical History Eyes Patient has history of Cataracts - scheduled for cataract surgery Denies history of Glaucoma, Optic Neuritis Ear/Nose/Mouth/Throat Denies history of Chronic sinus problems/congestion, Middle ear problems Hematologic/Lymphatic Denies history of Anemia, Hemophilia, Human Immunodeficiency Virus, Lymphedema, Sickle Cell Disease Respiratory Denies history of Aspiration, Asthma, Chronic Obstructive Pulmonary Disease (COPD), Pneumothorax, Sleep Apnea, Tuberculosis Cardiovascular Patient has history of Hypertension - essential, benign , 12/06/10, Peripheral Venous Disease - 01/21/2012 Denies history of Angina, Arrhythmia, Congestive Heart Failure, Coronary Artery Disease, Deep Vein Thrombosis, Phlebitis, Vasculitis Gastrointestinal Denies history of Cirrhosis , Colitis, Crohnoos, Hepatitis A, Hepatitis B, Hepatitis  C Endocrine Patient has history of Type II Diabetes - on meds Genitourinary Denies history of End Stage Renal Disease Immunological Denies history of Lupus Erythematosus, Raynaudoos, Scleroderma Integumentary (Skin) Denies history of History of Burn Musculoskeletal Denies history of Gout, Rheumatoid Arthritis, Osteoarthritis, Osteomyelitis Neurologic Patient has history of Neuropathy - on meds Denies history of Dementia, Quadriplegia, Paraplegia, Seizure Disorder Oncologic Denies history of Received Chemotherapy, Received Radiation Psychiatric Denies history of Anorexia/bulimia, Confinement Anxiety Review of Systems  (ROS) Constitutional Symptoms (General Health) Denies complaints or symptoms of Fatigue, Fever, Chills, Marked Weight Change. Ear/Nose/Mouth/Throat Denies complaints or symptoms of Chronic sinus problems or rhinitis. Respiratory Denies complaints or symptoms of Chronic or frequent coughs, Shortness of Breath. Gastrointestinal Denies complaints or symptoms of Frequent diarrhea, Nausea, Vomiting. Genitourinary Denies complaints or symptoms of Frequent urination. Integumentary (Skin) Denies complaints or symptoms of Wounds. Musculoskeletal Denies complaints or symptoms of Muscle Pain, Muscle Weakness. Psychiatric Denies complaints or symptoms of Claustrophobia, Suicidal. Objective Constitutional Patient is hypertensive.. Pulse regular and within target range for patient.Marland Kitchen Respirations regular, non-labored and within target range.. Patient is febrile today.. Patient looks somewhat ill but alert and conversational. Vitals Time Taken: 2:41 PM, Height: 62 in, Source: Stated, Weight: 160 lbs, Source: Stated, BMI: 29.3, Temperature: 99.1 F, Pulse: 94 bpm, Respiratory Rate: 18 breaths/min, Blood Pressure: 186/83 mmHg. General Notes: patient stated she has not checked her blood sugar in a few days Eyes Conjunctivae clear. No discharge.no icterus. Respiratory work of breathing is normal. Cardiovascular Pedal pulses palpable and strong on the left.. Neurological Diabetic neuropathy. Psychiatric appears at normal baseline. General Notes: Wound exam; the patient has a necrotic wound on the tip of her left heel. There is purulent drainage extending into the plantar heel. I did not probe the wound there is erythema extending greater than 2 cm from the wound circumference especially medially I have marked these areas. Very purulent drainage with a foul odor. Integumentary (Hair, Skin) Wound #4 status is Open. Original cause of wound was Pressure Injury. The wound is located on the  Left Calcaneus. The wound measures 3.3cm length x 3cm width x 0.1cm depth; 7.775cm^2 area and 0.778cm^3 volume. There is no tunneling or undermining noted. There is a small amount of purulent drainage noted. Foul odor after cleansing was noted. The wound margin is indistinct and nonvisible. There is no granulation within the wound bed. There is a large (67-100%) amount of necrotic tissue within the wound bed including Eschar and Adherent Slough. Assessment Active Problems ICD-10 Type 2 diabetes mellitus with foot ulcer Non-pressure chronic ulcer of left heel and midfoot with other specified severity Cutaneous abscess of left foot Cellulitis of left lower limb Plan Discharge From [facility] Services: Discharge from [facility] - patient to follow up with Dr. [name] Given or follow up with ED. Primary Wound Dressing: Wound #4 Left Calcaneus: Calcium Alginate with Silver Secondary Dressing: Wound #4 Left Calcaneus: Kerlix/Rolled Gauze Dry Gauze Heel Cup Additional Orders / Instructions: Other: - patient to make a decision whether to have left foot amputation or attempt to save foot go through ED for evaluation of IV antibiotics, imaging of left heel, with possibility of OR debridement of heel. The following medication(s) was prescribed: lidocaine topical 4 % gel gel topical applied only in clinic prior to debridement by MD. was prescribed at facility 1. Infected diabetic foot ulcer 2. This is really quite a serious situation. She has not had x-rays or MRIs but certainly the possibility of underlying osteomyelitis is there. Even  if she does not have osteomyelitis she has a serious diabetic foot infection with an abscess and spreading infection. 3. I think I have clarified however think about this. If she does not proceed with amputation tomorrow as suggested by Dr. [name] Given she needs to be hospitalized for IV antibiotics, imaging and possibly surgery to clear out necrotic tissue around the wound.  Even with this aggressive approach the chances of salvaging her foot may not be all that high. I discussed this is clearly as I could. I emphasized that she cannot wait on this one of the other needs to be decided upon. It is likely that she will become systemically unwell/septic if she puts this off too much longer. Electronic Signature(s) Signed: 08/25/2019 5:01:00 PM By: [name] MD Entered By: [name] on 08/25/2019 15:42:23 -------------------------------------------------------------------------------- HxROS Details Patient Name: Date of Service: [name] 08/25/2019 2:45 PM Medical Record [id number] Patient Account Number: [id number] Date of Birth/Sex: Treating RN: 1965-12-11 (53 y.o. Clearnce Sorrel Primary Care Provider: Penni Homans Other Clinician: Referring Provider: Treating Provider/Extender:Robson, Kirby Crigler, Lewis Moccasin in Treatment: 0 Information Obtained From Patient Constitutional Symptoms (General Health) Complaints and Symptoms: Negative for: Fatigue; Fever; Chills; Marked Weight Change Ear/Nose/Mouth/Throat Complaints and Symptoms: Negative for: Chronic sinus problems or rhinitis Medical History: Negative for: Chronic sinus problems/congestion; Middle ear problems Respiratory Complaints and Symptoms: Negative for: Chronic or frequent coughs; Shortness of Breath Medical History: Negative for: Aspiration; Asthma; Chronic Obstructive Pulmonary Disease (COPD); Pneumothorax; Sleep Apnea; Tuberculosis Gastrointestinal Complaints and Symptoms: Negative for: Frequent diarrhea; Nausea; Vomiting Medical History: Negative for: Cirrhosis ; Colitis; Crohns; Hepatitis A; Hepatitis B; Hepatitis C Genitourinary Complaints and Symptoms: Negative for: Frequent urination Medical History: Negative for: End Stage Renal Disease Integumentary (Skin) Complaints and Symptoms: Negative for: Wounds Medical History: Negative for:  History of Burn Musculoskeletal Complaints and Symptoms: Negative for: Muscle Pain; Muscle Weakness Medical History: Negative for: Gout; Rheumatoid Arthritis; Osteoarthritis; Osteomyelitis Psychiatric Complaints and Symptoms: Negative for: Claustrophobia; Suicidal Medical History: Negative for: Anorexia/bulimia; Confinement Anxiety Eyes Medical History: Positive for: Cataracts - scheduled for cataract surgery Negative for: Glaucoma; Optic Neuritis Hematologic/Lymphatic Medical History: Negative for: Anemia; Hemophilia; Human Immunodeficiency Virus; Lymphedema; Sickle Cell Disease Cardiovascular Medical History: Positive for: Hypertension - essential, benign , 12/06/10; Peripheral Venous Disease - 01/21/2012 Negative for: Angina; Arrhythmia; Congestive Heart Failure; Coronary Artery Disease; Deep Vein Thrombosis; Phlebitis; Vasculitis Endocrine Medical History: Positive for: Type II Diabetes - on meds Time with diabetes: 10/07/10 Treated with: Insulin, Oral agents Blood sugar testing results: Lunch: 100; Dinner: 250 Immunological Medical History: Negative for: Lupus Erythematosus; Raynauds; Scleroderma Neurologic Medical History: Positive for: Neuropathy - on meds Negative for: Dementia; Quadriplegia; Paraplegia; Seizure Disorder Oncologic Medical History: Negative for: Received Chemotherapy; Received Radiation HBO Extended History Items Eyes: Cataracts Immunizations Tetanus Vaccine: Last tetanus shot: 02/16/2012 Implantable Devices No devices added Family and Social History Cancer: Yes - brother; Diabetes: Yes - Paternal Grandparents; Heart Disease: No; Hereditary Spherocytosis: No; Hypertension: No; Kidney Disease: No; Lung Disease: No; Seizures: No; Stroke: Yes - brother; Thyroid Problems: No; Tuberculosis: No; Never smoker; Marital Status - Married; Alcohol Use: Never; Drug Use: No History; Caffeine Use: Daily - coffee; Financial Concerns: No; Food, Clothing or  Shelter Needs: No; Support System Lacking: No; Transportation Concerns: No Electronic Signature(s) Signed: 08/25/2019 5:01:00 PM By: [name] MD Signed: 08/25/2019 5:33:40 PM By: Kela Millin Entered By: Kela Millin on 08/25/2019 14:45:40 -------------------------------------------------------------------------------- SuperBill Details Patient Name: Date of Service: [name] 08/25/2019 Medical Record [id number]  Patient Account Number: [id number] Date of Birth/Sex: Treating RN: June 24, 1966 (53 y.o. Helene Shoe, Tammi Klippel Primary Care Provider: Penni Homans Other Clinician: Referring Provider: Treating Provider/Extender:Robson, Kirby Crigler, Lewis Moccasin in Treatment: 0 Diagnosis Coding ICD-10 Codes Code Description E11.621 Type 2 diabetes mellitus with foot ulcer L97.428 Non-pressure chronic ulcer of left heel and midfoot with other specified severity L02.612 Cutaneous abscess of left foot L03.116 Cellulitis of left lower limb Facility Procedures CPT4 Code: [id number] Description: 99214 - WOUND CARE VISIT-LEV 4 EST PT Modifier: Quantity: 1 Physician Procedures CPT4 Code Description: [id number] - WC PHYS LEVEL 2 - NEW PT ICD-10 Diagnosis Description E11.621 Type 2 diabetes mellitus with foot ulcer L97.428 Non-pressure chronic ulcer of left heel and midfoot wit L02.612 Cutaneous abscess of left foot L03.116  Cellulitis of left lower limb Modifier: h other specifie Quantity: 1 d severity Electronic Signature(s) Signed: 08/25/2019 5:01:00 PM By: [name] MD Entered By: [name] on 08/25/2019 15:42:58

## 2019-08-25 NOTE — Anesthesia Preprocedure Evaluation (Deleted)
Anesthesia Evaluation    Reviewed: Allergy & Precautions, Patient's Chart, lab work & pertinent test results  History of Anesthesia Complications Negative for: history of anesthetic complications  Airway        Dental   Pulmonary former smoker,           Cardiovascular hypertension, Pt. on medications and Pt. on home beta blockers + CAD, + Past MI, + CABG, + Peripheral Vascular Disease and +CHF     '19 Cath -  Ost RPDA to RPDA lesion is 40% stenosed.  RPDA-1 lesion is 50% stenosed.  RPDA-2 lesion is 80% stenosed.  Prox RCA lesion is 20% stenosed.  Mid RCA lesion is 20% stenosed.  Mid LM to Dist LM lesion is 80% stenosed.  Dist LM to Prox LAD lesion is 85% stenosed.  Prox LAD to Mid LAD lesion is 80% stenosed.  Ost 2nd Diag to 2nd Diag lesion is 95% stenosed.  Mid LAD lesion is 95% stenosed.  Dist LAD lesion is 99% stenosed.  Prox Cx to Mid Cx lesion is 60% stenosed.  LV end diastolic pressure is severely elevated.  There is moderate left ventricular systolic dysfunction.  '19 TTE - mild LVH. EF 50% to 55%. Anterior and anterolateral hypokinesis. Grade 2 diastolic dysfunction. Mildly dilated LA  '19 Carotid US - 1-39% b/l ICAS    Neuro/Psych  Headaches, PSYCHIATRIC DISORDERS Anxiety Depression  RLS  CVA    GI/Hepatic negative GI ROS, Neg liver ROS,   Endo/Other  diabetes, Type 2, Insulin DependentHypothyroidism   Renal/GU Renal disease     Musculoskeletal  (+) Arthritis ,   Abdominal   Peds  Hematology  (+) anemia ,   Anesthesia Other Findings Covid neg 12/15  Reproductive/Obstetrics                             Anesthesia Physical Anesthesia Plan  ASA: IV  Anesthesia Plan: General   Post-op Pain Management:  Regional for Post-op pain   Induction: Intravenous  PONV Risk Score and Plan: 3 and Ondansetron, Midazolam, Treatment may vary due to age or medical  condition and Dexamethasone  Airway Management Planned: LMA  Additional Equipment: None  Intra-op Plan:   Post-operative Plan: Extubation in OR  Informed Consent:   Plan Discussed with: CRNA and Anesthesiologist  Anesthesia Plan Comments:        Anesthesia Quick Evaluation

## 2019-08-25 NOTE — Progress Notes (Signed)
Anesthesia Chart Review: [name]    Case: [id number] Date/Time: 08/26/19 0715   Procedure: LEFT BELOW KNEE AMPUTATION (Left Knee)   Anesthesia type: Choice   Pre-op diagnosis: Gangrene Left Heel   Location: [facility] OR ROOM 05 / [facility]   Surgeons: [name], MD      DISCUSSION: Patient is a 53 year old female scheduled for the above procedure. She has wound clinic visit with [name], MD on 08/25/19--patient requested visit prior to surgery. She is s/p right BKA 12/31/18.   History includes CAD (NSTEMI, s/p CABG: LIMA-LAD, SVG-D1-AL1, left RA-OM1 06/28/18; poor quality targets except LAD, conduits small but good quality, not a candidate for redo CABG by Op Note), chronic diastolic CHF, HTN, DM2 (with neuropathy), CVA (2014; [place] 2017), HLD, PAD (s/p right BKA 12/31/18; left great toe amputation 2018), hypothyroidism, former smoker (quit 2007).   Last seen by cardiologist Dr. [name] on 02/24/19 and continued medical therapy for CAD recommended.   08/23/19 COVID-19 test negative. Anesthesia team to evaluate prior to surgery.    VS: For day of surgery. LMP 02/12/2011 (Exact Date)   BP Readings from Last 3 Encounters:  02/24/19 132/79  01/15/19 (!) 159/85  01/05/19 128/76    PROVIDERS: Mosie Lukes, MD is PCP  Cherlynn Kaiser, MD is cardiologist ([facility])   LABS: For day of surgery. As of 01/14/19, H/H 8.8, 27.8, PLT 613, Cr 1.21. A1c 10.5 on 12/12/18.    EKG: For updated EKG on arrival. Last tracing 06/29/18.    CV: TEE (intra-op CABG) 06/28/18: Result status: Final result   Mitral valve: The mitral leaflets are mildly thickened. The leaflets opened normally. There was normal coaptation of the leaflets without prolapsing or flail leaflet segments. There was trace mitral insufficiency.  Right ventricle: The RV was normal in size. There is normal RV systolic function. At the completion of the procedure following chest closure, there was a reduction in filling of  the right ventricle. The RV wall motion appeared unchanged.  Tricuspid valve: Tricuspid valve leaflets appeared structurally normal. There was 1+ tricuspid insufficiency. The tricuspid annulus was normal in diameter and measured 2.8 cm in the four-chamber view in diastole.  Pulmonic valve: Trace regurgitation.    Echo 06/24/18: Study Conclusions - Left ventricle: The cavity size was normal. Wall thickness was   increased in a pattern of mild LVH. Systolic function was low   normal to mildly reduced. The estimated ejection fraction was in   the range of 50% to 55%. Anterior and anterolateral hypokinesis.   Features are consistent with a pseudonormal left ventricular   filling pattern, with concomitant abnormal relaxation and   increased filling pressure (grade 2 diastolic dysfunction). - Aortic valve: There was no stenosis. - Mitral valve: There was no significant regurgitation. - Left atrium: The atrium was mildly dilated. - Right ventricle: The cavity size was normal. Systolic function   was normal. - Pulmonary arteries: No complete TR doppler jet so unable to   estimate PA systolic pressure. - Systemic veins: IVC measured 1.8 cm with < 50% respirophasic   variation, suggesting RA pressure 8 mmHg. Impressions: - Normal LV size with mild LV hypertrophy. EF 50-55%, anterior and   anterolateral hypokinesis. Normal RV size and systolic function.   No significant valvular abnormalities.  Carotid duplex 06/25/18:  Summary: Right Carotid: Velocities in the right ICA are consistent with a 1-39% stenosis. Left Carotid: Velocities in the left ICA are consistent with a 1-39% stenosis. Vertebrals:  Bilateral vertebral arteries demonstrate antegrade flow. Subclavians: Normal flow hemodynamics were seen in bilateral subclavian              arteries.  Last cardiac cath was 06/24/18 prior to CABG.   Past Medical History:  Diagnosis Date  . Abscess of great toe, right   . Acute on chronic  diastolic CHF (congestive heart failure) ([place]) 06/24/2018  . Acute osteomyelitis of toe, left ([place]) 09/05/2016  . Amputation of right great toe ([place]) 12/22/2018  . Anxiety   . Cellulitis of foot, right 12/11/2018  . COMMON MIGRAINE 10/07/2010  . Decreased visual acuity 11/10/2016  . Depression 12/18/2012  . Diabetes mellitus type II, uncontrolled ([place]) 10/07/2010   Qualifier: Diagnosis of  By: Charlett Blake MD, [name]    . Diabetic foot infection ([place]) 08/26/2016  . Diabetic infection of right foot ([place])   . Disturbance of skin sensation 10/07/2010  . Gangrene of right foot ([place])   . Heart murmur   . History of kidney stones    "years ago"  . Hyperlipidemia 12/06/2010  . Hypertension   . Lipoma of abdominal wall 10/05/2016  . Overweight(278.02) 12/06/2010  . PERIPHERAL NEUROPATHY, FEET 10/07/2010  . PVD (peripheral vascular disease) ([place]) 01/21/2012  . RESTLESS LEG SYNDROME 10/25/2010  . Stroke [facility]) 2014, 2017   most recently in 2/17 - intracerebral hemorrhage    Past Surgical History:  Procedure Laterality Date  . AMPUTATION Right 12/13/2018   Procedure: AMPUTATION RIGHT GREAT TOE, LOCAL RELOCATION OF TISSUE FOR WOUND CLOSURE 9cm x 3cm, VAC APPLICATION;  Surgeon: [name], MD;  Location: [facility];  Service: Orthopedics;  Laterality: Right;  . AMPUTATION Right 12/31/2018   Procedure: RIGHT BELOW KNEE AMPUTATION;  Surgeon: [name], MD;  Location: [place];  Service: Orthopedics;  Laterality: Right;  . AMPUTATION TOE Left 09/09/2016   Procedure: AMPUTATION OF LEFT GREAT TOE;  Surgeon: Milly Jakob, MD;  Location: [facility];  Service: Orthopedics;  Laterality: Left;  . CARDIAC CATHETERIZATION  06/24/2018  . CORONARY ARTERY BYPASS GRAFT N/A 06/28/2018   Procedure: CORONARY ARTERY BYPASS GRAFTING (CABG) times  four, using left internal mammary artery, endoscopically harvested right saphenous vein, and harvested left radial artery;  Surgeon: [name], MD;  Location: [facility];   Service: Open Heart Surgery;  Laterality: N/A;  . ENDOVEIN HARVEST OF GREATER SAPHENOUS VEIN Right 06/28/2018   Procedure: ENDOVEIN HARVEST OF GREATER SAPHENOUS VEIN;  Surgeon: [name], MD;  Location: [facility];  Service: Open Heart Surgery;  Laterality: Right;  . LEFT HEART CATH AND CORONARY ANGIOGRAPHY N/A 06/24/2018   Procedure: LEFT HEART CATH AND CORONARY ANGIOGRAPHY;  Surgeon: Troy Sine, MD;  Location: [facility];  Service: Cardiovascular;  Laterality: N/A;  . RADIAL ARTERY HARVEST Left 06/28/2018   Procedure: RADIAL ARTERY HARVEST;  Surgeon: [name], MD;  Location: [facility];  Service: Open Heart Surgery;  Laterality: Left;  . TEE WITHOUT CARDIOVERSION N/A 06/28/2018   Procedure: TRANSESOPHAGEAL ECHOCARDIOGRAM (TEE);  Surgeon: [name], MD;  Location: [facility];  Service: Open Heart Surgery;  Laterality: N/A;  . WISDOM TOOTH EXTRACTION      MEDICATIONS: No current facility-administered medications for this encounter.   Marland Kitchen acetaminophen (TYLENOL) 325 MG tablet  . amitriptyline (ELAVIL) 10 MG tablet  . amLODipine (NORVASC) 5 MG tablet  . aspirin EC 81 MG tablet  . atorvastatin (LIPITOR) 80 MG tablet  . azelastine (ASTELIN) 0.1 % nasal spray  .  Blood Glucose Monitoring Suppl (ONE TOUCH ULTRA 2) w/Device KIT  . cadexomer iodine (IODOSORB) 0.9 % gel  . docusate sodium (COLACE) 100 MG capsule  . doxycycline (VIBRA-TABS) 100 MG tablet  . doxycycline (VIBRAMYCIN) 50 MG capsule  . escitalopram (LEXAPRO) 10 MG tablet  . fluticasone (FLONASE) 50 MCG/ACT nasal spray  . Insulin Glargine (LANTUS) 100 UNIT/ML Solostar Pen  . Insulin Pen Needle (PEN NEEDLES) 31G X 5 MM MISC  . insulin regular (HUMULIN R) 100 units/mL injection  . isosorbide mononitrate (IMDUR) 30 MG 24 hr tablet  . levothyroxine (SYNTHROID) 25 MCG tablet  . metoCLOPramide (REGLAN) 5 MG tablet  . metoprolol tartrate (LOPRESSOR) 50 MG tablet  . Multiple Vitamin (MULTIVITAMIN WITH  MINERALS) TABS tablet  . ondansetron (ZOFRAN) 4 MG tablet  . ONE TOUCH ULTRA TEST test strip  . pantoprazole (PROTONIX) 40 MG tablet  . pentoxifylline (TRENTAL) 400 MG CR tablet  . silver sulfADIAZINE (SILVADENE) 1 % cream    Myra Gianotti, PA-C Surgical Short Stay/Anesthesiology [facility] Phone [phone] [facility] Phone [phone] 08/25/2019 3:19 PM

## 2019-08-25 NOTE — Progress Notes (Addendum)
COLINDA, ZYLA ([id number]) Visit Report for 08/25/2019 Allergy List Details Patient Name: Date of Service: TALYN, MESNARD 08/25/2019 2:45 PM Medical Record [id number] Patient Account Number: [id number] Date of Birth/Sex: Treating RN: [birth year] (53 y.o. Clearnce Sorrel Primary Care Provider: Penni Homans Other Clinician: Referring Provider: Treating Provider/Extender:Robson, Kirby Crigler, Lewis Moccasin in Treatment: 0 Allergies Active Allergies hydrocodone Reaction: rash Severity: Moderate penicillin Reaction: hives Lyrica morphine Reaction: nausea and vomiting tramadol Reaction: nausea and vomiting Allergy Notes Electronic Signature(s) Signed: 08/25/2019 5:33:40 PM By: Kela Millin Entered By: Kela Millin on 08/25/2019 14:44:07 -------------------------------------------------------------------------------- Arrival Information Details Patient Name: Date of Service: [name] 08/25/2019 2:45 PM Medical Record [id number] Patient Account Number: [id number] Date of Birth/Sex: Treating RN: 09/25/1965 (53 y.o. Clearnce Sorrel Primary Care Provider: Penni Homans Other Clinician: Referring Provider: Treating Provider/Extender:Robson, Kirby Crigler, Lewis Moccasin in Treatment: 0 Visit Information Patient Arrived: Wheel Chair Arrival Time: 14:37 Accompanied By: family member Transfer Assistance: None Patient Identification Verified: Yes Secondary Verification Process Yes Completed: History Since Last Visit Electronic Signature(s) Signed: 08/25/2019 5:33:40 PM By: Kela Millin Entered By: Kela Millin on 08/25/2019 14:40:58 -------------------------------------------------------------------------------- Clinic Level of Care Assessment Details Patient Name: Date of Service: NADEYA, DENUNZIO 08/25/2019 2:45 PM Medical Record [id number] Patient Account Number: [id number] Date of Birth/Sex: Treating  RN: 1965/11/04 (53 y.o. Helene Shoe, Tammi Klippel Primary Care Provider: Penni Homans Other Clinician: Referring Provider: Treating Provider/Extender:Robson, Kirby Crigler, Lewis Moccasin in Treatment: 0 Clinic Level of Care Assessment Items TOOL 2 Quantity Score X - Use when only an EandM is performed on the INITIAL visit 1 0 ASSESSMENTS - Nursing Assessment / Reassessment X - General Physical Exam (combine w/ comprehensive assessment (listed just below) 1 20 when performed on new pt. evals) X - Comprehensive Assessment (HX, ROS, Risk Assessments, Wounds Hx, etc.) 1 25 ASSESSMENTS - Wound and Skin Assessment / Reassessment []  - Simple Wound Assessment / Reassessment - one wound 0 X - Complex Wound Assessment / Reassessment - multiple wounds 1 5 X - Dermatologic / Skin Assessment (not related to wound area) 1 10 ASSESSMENTS - Ostomy and/or Continence Assessment and Care []  - Incontinence Assessment and Management 0 []  - Ostomy Care Assessment and Management (repouching, etc.) 0 PROCESS - Coordination of Care []  - Simple Patient / Family Education for ongoing care 0 X - Complex (extensive) Patient / Family Education for ongoing care 1 20 X - Staff obtains Programmer, systems, Records, Test Results / Process Orders 1 10 []  - Staff telephones HHA, Nursing Homes / Clarify orders / etc 0 []  - Routine Transfer to another Facility (non-emergent condition) 0 []  - Routine Hospital Admission (non-emergent condition) 0 []  - New Admissions / Biomedical engineer / Ordering NPWT, Apligraf, etc. 0 []  - Emergency Hospital Admission (emergent condition) 0 []  - Simple Discharge Coordination 0 X - Complex (extensive) Discharge Coordination 1 15 PROCESS - Special Needs []  - Pediatric / Minor Patient Management 0 []  - Isolation Patient Management 0 []  - Hearing / Language / Visual special needs 0 []  - Assessment of Community assistance (transportation, D/C planning, etc.) 0 []  - Additional assistance / Altered  mentation 0 []  - Support Surface(s) Assessment (bed, cushion, seat, etc.) 0 INTERVENTIONS - Wound Cleansing / Measurement X - Wound Imaging (photographs - any number of wounds) 1 5 []  - Wound Tracing (instead of photographs) 0 []  - Simple Wound Measurement - one wound 0 X - Complex Wound Measurement - multiple wounds 1 5 []  - Simple Wound Cleansing - one  wound 0 X - Complex Wound Cleansing - multiple wounds 1 5 INTERVENTIONS - Wound Dressings []  - Small Wound Dressing one or multiple wounds 0 X - Medium Wound Dressing one or multiple wounds 1 15 []  - Large Wound Dressing one or multiple wounds 0 []  - Application of Medications - injection 0 INTERVENTIONS - Miscellaneous []  - External ear exam 0 []  - Specimen Collection (cultures, biopsies, blood, body fluids, etc.) 0 []  - Specimen(s) / Culture(s) sent or taken to Lab for analysis 0 []  - Patient Transfer (multiple staff / Harrel Lemon Lift / Similar devices) 0 []  - Simple Staple / Suture removal (25 or less) 0 []  - Complex Staple / Suture removal (26 or more) 0 []  - Hypo / Hyperglycemic Management (close monitor of Blood Glucose) 0 []  - Ankle / Brachial Index (ABI) - do not check if billed separately 0 Has the patient been seen at the hospital within the last three years: Yes Total Score: 135 Level Of Care: New/Established - Level 4 Electronic Signature(s) Signed: 08/25/2019 5:34:18 PM By: Deon Pilling Entered By: Deon Pilling on 08/25/2019 15:30:57 -------------------------------------------------------------------------------- Encounter Discharge Information Details Patient Name: Date of Service: [name] 08/25/2019 2:45 PM Medical Record [id number] Patient Account Number: [id number] Date of Birth/Sex: Treating RN: 1966/07/15 (53 y.o. Orvan Falconer Primary Care Provider: Penni Homans Other Clinician: Referring Provider: Treating Provider/Extender:Robson, Kirby Crigler, Lewis Moccasin in Treatment: 0 Encounter  Discharge Information Items Discharge Condition: Stable Ambulatory Status: Wheelchair Discharge Destination: Home Transportation: Private Auto Accompanied By: son Schedule Follow-up Appointment: Yes Clinical Summary of Care: Patient Declined Electronic Signature(s) Signed: 08/25/2019 4:51:50 PM By: Carlene Coria RN Entered By: Carlene Coria on 08/25/2019 15:45:04 -------------------------------------------------------------------------------- Lower Extremity Assessment Details Patient Name: Date of Service: GANELL, GALANOS 08/25/2019 2:45 PM Medical Record [id number] Patient Account Number: [id number] Date of Birth/Sex: Treating RN: Nov 03, 1965 (53 y.o. Clearnce Sorrel Primary Care Provider: Other Clinician: Penni Homans Referring Provider: Treating Provider/Extender:Robson, Kirby Crigler, Lewis Moccasin in Treatment: 0 Edema Assessment Assessed: [Left: No] [Right: No] Edema: [Left: Ye] [Right: s] Calf Left: Right: Point of Measurement: cm From Medial Instep 29 cm cm Ankle Left: Right: Point of Measurement: cm From Medial Instep 20.9 cm cm Vascular Assessment Pulses: Dorsalis Pedis Palpable: [Left:Yes] Electronic Signature(s) Signed: 08/25/2019 5:33:40 PM By: Kela Millin Entered By: Kela Millin on 08/25/2019 14:52:08 -------------------------------------------------------------------------------- Multi Wound Chart Details Patient Name: Date of Service: [name] 08/25/2019 2:45 PM Medical Record [id number] Patient Account Number: [id number] Date of Birth/Sex: Treating RN: 06-Feb-1966 (53 y.o. F) Primary Care Provider: Penni Homans Other Clinician: Referring Provider: Treating Provider/Extender:Robson, Kirby Crigler, Lewis Moccasin in Treatment: 0 Vital Signs Height(in): 62 Pulse(bpm): 45 Weight(lbs): 160 Blood Pressure(mmHg): 186/83 Body Mass Index(BMI): 29 Temperature(F): 99.1 Respiratory 18 Rate(breaths/min): Photos:  [4:No Photos] [N/A:N/A] Wound Location: [4:Left Calcaneus] [N/A:N/A] Wounding Event: [4:Pressure Injury] [N/A:N/A] Primary Etiology: [4:Diabetic Wound/Ulcer of the N/A Lower Extremity] Secondary Etiology: [4:Pressure Ulcer] [N/A:N/A] Comorbid History: [4:Cataracts, Hypertension, N/A Peripheral Venous Disease, Type II Diabetes, Neuropathy] Date Acquired: [4:12/08/2018] [N/A:N/A] Weeks of Treatment: [4:0] [N/A:N/A] Wound Status: [4:Open] [N/A:N/A] Measurements L x W x D 3.3x3x0.1 [N/A:N/A] (cm) Area (cm) : [4:7.775] [N/A:N/A] Volume (cm) : [4:0.778] [N/A:N/A] % Reduction in Area: [4:0.00%] [N/A:N/A] % Reduction in Volume: 0.00% [N/A:N/A] Classification: [4:Grade 2] [N/A:N/A] Exudate Amount: [4:Small] [N/A:N/A] Exudate Type: [4:Purulent] [N/A:N/A] Exudate Color: [4:yellow, brown, green] [N/A:N/A] Foul Odor After Cleansing:Yes [N/A:N/A] Odor Anticipated Due to No [N/A:N/A] Product Use: Wound Margin: [4:Indistinct, nonvisible] [N/A:N/A] Granulation Amount: [4:None Present (0%)] [N/A:N/A] Necrotic  Amount: [4:Large (67-100%)] [N/A:N/A] Necrotic Tissue: [4:Eschar, Adherent Slough N/A] Exposed Structures: [4:Fascia: No Fat Layer (Subcutaneous Tissue) Exposed: No Tendon: No Muscle: No Joint: No Bone: No None] [N/A:N/A N/A] Treatment Notes Electronic Signature(s) Signed: 08/25/2019 5:01:00 PM By: [name] MD Entered By: [name] on 08/25/2019 15:31:55 -------------------------------------------------------------------------------- Multi-Disciplinary Care Plan Details Patient Name: Date of Service: [name] 08/25/2019 2:45 PM Medical Record [id number] Patient Account Number: [id number] Date of Birth/Sex: Treating RN: Jan 10, 1966 (53 y.o. Debby Bud Primary Care Provider: Penni Homans Other Clinician: Referring Provider: Treating Provider/Extender:Robson, Kirby Crigler, Lewis Moccasin in Treatment: 0 Active Inactive Electronic Signature(s) Signed:  08/25/2019 5:34:18 PM By: Deon Pilling Entered By: Deon Pilling on 08/25/2019 17:24:38 -------------------------------------------------------------------------------- Pain Assessment Details Patient Name: Date of Service: LOVENE, FISHELL 08/25/2019 2:45 PM Medical Record [id number] Patient Account Number: [id number] Date of Birth/Sex: Treating RN: 06/19/1966 (53 y.o. Clearnce Sorrel Primary Care Provider: Penni Homans Other Clinician: Referring Provider: Treating Provider/Extender:Robson, Kirby Crigler, Lewis Moccasin in Treatment: 0 Active Problems Location of Pain Severity and Description of Pain Patient Has Paino No Site Locations Pain Management and Medication Current Pain Management: Electronic Signature(s) Signed: 08/25/2019 5:33:40 PM By: Kela Millin Entered By: Kela Millin on 08/25/2019 14:56:40 -------------------------------------------------------------------------------- Patient/Caregiver Education Details Patient Name: Date of Service: [name] 12/17/2020andnbsp2:45 PM Medical Record Patient Account Number: [id number] Number: Treating RN: Deon Pilling Date of Birth/Gender: 03-12-1966 (53 y.o. F) Other Clinician: Primary Care Physician: Penni Homans Treating Royal Hawthorn Referring Physician: Physician/Extender: Larey Days in Treatment: 0 Education Assessment Education Provided To: Patient Education Topics Provided Welcome To [facility]: Handouts: Welcome To [facility] Methods: Explain/Verbal, Printed Responses: Reinforcements needed Wound/Skin Impairment: Handouts: Caring for Your Ulcer Methods: Explain/Verbal, Printed Responses: Reinforcements needed Electronic Signature(s) Signed: 08/25/2019 5:34:18 PM By: Deon Pilling Entered By: Deon Pilling on 08/25/2019  15:15:29 -------------------------------------------------------------------------------- Wound Assessment Details Patient Name: Date of Service: GETRUDE, CANSLER 08/25/2019 2:45 PM Medical Record [id number] Patient Account Number: [id number] Date of Birth/Sex: Treating RN: 1966/05/01 (53 y.o. Clearnce Sorrel Primary Care Provider: Penni Homans Other Clinician: Referring Provider: Treating Provider/Extender:Robson, Kirby Crigler, Lewis Moccasin in Treatment: 0 Wound Status Wound Number: 4 Primary Diabetic Wound/Ulcer of the Lower Extremity Etiology: Wound Location: Left Calcaneus Secondary Pressure Ulcer Wounding Event: Pressure Injury Etiology: Date Acquired: 12/08/2018 Wound Not Healed Weeks Of Treatment: 0 Status: Clustered Wound: No Comorbid Cataracts, Hypertension, Peripheral Venous History: Disease, Type II Diabetes, Neuropathy Photos Wound Measurements Length: (cm) 3.3 % Reductio Width: (cm) 3 % Reductio Depth: (cm) 0.1 Epithelial Area: (cm) 7.775 Tunneling Volume: (cm) 0.778 Undermini Wound Description Classification: Grade 2 Wound Margin: Indistinct, nonvisible Exudate Amount: Small Exudate Type: Purulent Exudate Color: yellow, brown, green Wound Bed Granulation Amount: None Present (0%) Necrotic Amount: Large (67-100%) Necrotic Quality: Eschar, Adherent Slough Foul Odor After Cleansing: Yes Due to Product Use: No Slough/Fibrino Yes Exposed Structure Fascia Exposed: No Fat Layer (Subcutaneous Tissue) Exposed: No Tendon Exposed: No Muscle Exposed: No Joint Exposed: No Bone Exposed: No n in Area: 0% n in Volume: 0% ization: None : No ng: No Treatment Notes Wound #4 (Left Calcaneus) 1. Cleanse With Wound Cleanser Soap and water 3. Primary Dressing Applied Calcium Alginate Ag 4. Secondary Dressing Dry Gauze Roll Gauze Heel Cup 5. Secured With Tape Notes Horticulturist, commercial) Signed: 08/30/2019 3:57:28 PM By: Mikeal Hawthorne EMT/HBOT Signed: 08/31/2019 4:54:21 PM By: Kela Millin Previous Signature: 08/25/2019 5:33:40 PM Version By: Kela Millin Previous Signature: 08/25/2019  5:34:18 PM Version By: Deon Pilling Entered By: Mikeal Hawthorne on 08/30/2019 09:09:15 -------------------------------------------------------------------------------- Vitals Details Patient Name: Date of Service: [name] 08/25/2019 2:45 PM Medical Record [id number] Patient Account Number: [id number] Date of Birth/Sex: Treating RN: 1966/03/19 (53 y.o. Clearnce Sorrel Primary Care Provider: Penni Homans Other Clinician: Referring Provider: Treating Provider/Extender:Robson, Kirby Crigler, Lewis Moccasin in Treatment: 0 Vital Signs Time Taken: 14:41 Temperature (F): 99.1 Height (in): 62 Pulse (bpm): 94 Source: Stated Respiratory Rate (breaths/min): 18 Weight (lbs): 160 Blood Pressure (mmHg): 186/83 Source: Stated Reference Range: 80 - 120 mg / dl Body Mass Index (BMI): 29.3 Notes patient stated she has not checked her blood sugar in a few days Electronic Signature(s) Signed: 08/25/2019 5:33:40 PM By: Kela Millin Entered By: Kela Millin on 08/25/2019 14:42:37

## 2019-08-25 NOTE — Progress Notes (Signed)
IRYS, MEINER ([id number]) Visit Report for 08/25/2019 Abuse/Suicide Risk Screen Details Patient Name: Date of Service: [name], [name] 08/25/2019 2:45 PM Medical Record [id number] Patient Account Number: [id number] Date of Birth/Sex: Treating RN: 1965/10/29 (53 y.o. Clearnce Sorrel Primary Care Alyus Mofield: Penni Homans Other Clinician: Referring Dejon Lukas: Treating Lexa Coronado/Extender:Robson, Kirby Crigler, Lewis Moccasin in Treatment: 0 Abuse/Suicide Risk Screen Items Answer ABUSE RISK SCREEN: Has anyone close to you tried to hurt or harm you recentlyo No Do you feel uncomfortable with anyone in your familyo No Has anyone forced you do things that you didnt want to doo No Electronic Signature(s) Signed: 08/25/2019 5:33:40 PM By: Kela Millin Entered By: Kela Millin on 08/25/2019 14:45:47 -------------------------------------------------------------------------------- Activities of Daily Living Details Patient Name: Date of Service: [name], [name] 08/25/2019 2:45 PM Medical Record [id number] Patient Account Number: [id number] Date of Birth/Sex: Treating RN: 1966/02/13 (53 y.o. Clearnce Sorrel Primary Care Alto Gandolfo: Penni Homans Other Clinician: Referring Sybil Shrader: Treating Tripp Goins/Extender:Robson, Kirby Crigler, Lewis Moccasin in Treatment: 0 Activities of Daily Living Items Answer Activities of Daily Living (Please select one for each item) Drive Automobile Not Able Take Medications Need Assistance Use Telephone Need Assistance Care for Appearance Need Assistance Use Toilet Need Assistance Bath / Shower Need Assistance Dress Self Need Assistance Feed Self Completely Able Walk Not Able Get In / Out Bed Need Assistance Housework Need Assistance Prepare Meals Need Assistance Handle Money Need Assistance Shop for Self Need Assistance Electronic Signature(s) Signed: 08/25/2019 5:33:40 PM By: Kela Millin Entered By:  Kela Millin on 08/25/2019 14:46:15 -------------------------------------------------------------------------------- Education Screening Details Patient Name: Date of Service: [name] 08/25/2019 2:45 PM Medical Record [id number] Patient Account Number: [id number] Date of Birth/Sex: Treating RN: Aug 03, 1966 (53 y.o. Clearnce Sorrel Primary Care Tuyen Uncapher: Penni Homans Other Clinician: Referring Chavie Kolinski: Treating Inaki Vantine/Extender:Robson, Kirby Crigler, Lewis Moccasin in Treatment: 0 Primary Learner Assessed: Patient Learning Preferences/Education Level/Primary Language Highest Education Level: High School Preferred Language: English Cognitive Barrier Language Barrier: No Translator Needed: No Memory Deficit: No Emotional Barrier: No Cultural/Religious Beliefs Affecting Medical Care: No Physical Barrier Impaired Vision: No Impaired Hearing: No Decreased Hand dexterity: No Knowledge/Comprehension Knowledge Level: Medium Comprehension Level: Medium Ability to understand written Medium instructions: Ability to understand verbal Medium instructions: Motivation Anxiety Level: Calm Cooperation: Cooperative Education Importance: Acknowledges Need Interest in Health Problems: Asks Questions Perception: Coherent Willingness to Engage in Self- High Management Activities: Readiness to Engage in Self- High Management Activities: Electronic Signature(s) Signed: 08/25/2019 5:33:40 PM By: Kela Millin Entered By: Kela Millin on 08/25/2019 14:46:40 -------------------------------------------------------------------------------- Fall Risk Assessment Details Patient Name: Date of Service: [name], Tiffany S. 08/25/2019 2:45 PM Medical Record [id number] Patient Account Number: [id number] Date of Birth/Sex: Treating RN: 08/22/1966 (53 y.o. Clearnce Sorrel Primary Care Amya Hlad: Penni Homans Other Clinician: Referring Lanee Chain:  Treating Anthony Roland/Extender:Robson, Kirby Crigler, Lewis Moccasin in Treatment: 0 Fall Risk Assessment Items Have you had 2 or more falls in the last 12 monthso 0 No Have you had any fall that resulted in injury in the last 12 monthso 0 No FALLS RISK SCREEN History of falling - immediate or within 3 months 25 Yes Secondary diagnosis (Do you have 2 or more medical diagnoseso) 15 Yes Ambulatory aid None/bed rest/wheelchair/nurse 0 Yes Crutches/cane/walker 0 No Furniture 0 No Intravenous therapy Access/Saline/Heparin Lock 0 No Weak (short steps with or without shuffle, stooped but able to lift head 0 No while walking, may seek support from furniture) Impaired (short steps with shuffle, may have difficulty arising from chair, 0 No head  down, impaired balance) Mental Status Oriented to own ability 0 Yes Overestimates or forgets limitations 0 No Risk Level: Medium Risk Score: 40 Electronic Signature(s) Signed: 08/25/2019 5:33:40 PM By: Kela Millin Entered By: Kela Millin on 08/25/2019 14:47:12 -------------------------------------------------------------------------------- Foot Assessment Details Patient Name: Date of Service: [name] 08/25/2019 2:45 PM Medical Record [id number] Patient Account Number: [id number] Date of Birth/Sex: Treating RN: 05/20/1966 (53 y.o. Clearnce Sorrel Primary Care Joe Tanney: Penni Homans Other Clinician: Referring Alvena Kiernan: Treating Hollis Tuller/Extender:Robson, Kirby Crigler, Lewis Moccasin in Treatment: 0 Foot Assessment Items Site Locations + = Sensation present, - = Sensation absent, C = Callus, U = Ulcer R = Redness, W = Warmth, M = Maceration, PU = Pre-ulcerative lesion F = Fissure, S = Swelling, D = Dryness Assessment Right: Left: Other Deformity: No No Prior Foot Ulcer: No No Prior Amputation: Yes Yes Charcot Joint: No No Ambulatory Status: Non-ambulatory Assistance Device: Wheelchair Gait: Actor) Signed: 08/25/2019 5:33:40 PM By: Kela Millin Entered By: Kela Millin on 08/25/2019 14:50:57 -------------------------------------------------------------------------------- Nutrition Risk Screening Details Patient Name: Date of Service: [name], [name] 08/25/2019 2:45 PM Medical Record [id number] Patient Account Number: [id number] Date of Birth/Sex: Treating RN: [birth year] (53 y.o. Clearnce Sorrel Primary Care Jaelynn Currier: Penni Homans Other Clinician: Referring Myrtice Lowdermilk: Treating Weyman Bogdon/Extender:Robson, Kirby Crigler, Lewis Moccasin in Treatment: 0 Height (in): 62 Weight (lbs): 160 Body Mass Index (BMI): 29.3 Nutrition Risk Screening Items Score Screening NUTRITION RISK SCREEN: I have an illness or condition that made me change the kind and/or 0 No amount of food I eat I eat fewer than two meals per day 0 No I eat few fruits and vegetables, or milk products 0 No I have three or more drinks of beer, liquor or wine almost every day 0 No I have tooth or mouth problems that make it hard for me to eat 0 No I don't always have enough money to buy the food I need 0 No I eat alone most of the time 0 No I take three or more different prescribed or over-the-counter drugs a day 1 Yes 0 No Without wanting to, I have lost or gained 10 pounds in the last six months I am not always physically able to shop, cook and/or feed myself 0 No Nutrition Protocols Good Risk Protocol 0 No interventions needed Moderate Risk Protocol High Risk Proctocol Risk Level: Good Risk Score: 1 Electronic Signature(s) Signed: 08/25/2019 5:33:40 PM By: Kela Millin Entered By: Kela Millin on 08/25/2019 14:47:29

## 2019-08-25 NOTE — Telephone Encounter (Signed)
Pt has an appt today at 2:45

## 2019-08-26 ENCOUNTER — Encounter (HOSPITAL_COMMUNITY)
Admission: RE | Disposition: A | Discharge status: Home / Self Care | Payer: Self-pay | Source: Home / Self Care | Attending: Orthopedic Surgery

## 2019-08-26 ENCOUNTER — Ambulatory Visit (HOSPITAL_COMMUNITY)
Admission: RE | Admit: 2019-08-26 | Discharge: 2019-08-26 | Disposition: A | Discharge status: Home / Self Care | Payer: BC Managed Care – PPO | Attending: Orthopedic Surgery | Admitting: Orthopedic Surgery

## 2019-08-26 ENCOUNTER — Other Ambulatory Visit: Payer: Self-pay | Admitting: Physician Assistant

## 2019-08-26 HISTORY — DX: Hypothyroidism, unspecified: E03.9

## 2019-08-26 SURGERY — AMPUTATION BELOW KNEE
Anesthesia: Choice | Site: Knee | Laterality: Left

## 2019-08-26 NOTE — H&P (Signed)
The patient was seen in the preoperative holding area today.  She was to undergo a left below-knee amputation.  She has a history of left foot gangrene and osteomyelitis.  She lives with her son and her son stated that emotionally she could not go through the procedure today.  She was emotionally distraught and said she was not emotionally ready for surgery  She did have a consultation yesterday and agreed that she needed a left below-knee amputation.  She understands if she does not go forward today the earliest opportunity for her to have surgery would be over a week..  If she develops fever or chills she understands she needs to go to the emergency room and would more than likely need emergency surgery.  We again recommend surgery today.  Both her and her son say emotionally they cannot go through with this today they understand the risks involved and she will be discharged home

## 2019-08-31 ENCOUNTER — Other Ambulatory Visit: Payer: Self-pay

## 2019-09-05 ENCOUNTER — Encounter (HOSPITAL_COMMUNITY): Payer: Self-pay | Admitting: Physician Assistant

## 2019-09-05 ENCOUNTER — Other Ambulatory Visit: Payer: Self-pay | Admitting: Physician Assistant

## 2019-09-05 ENCOUNTER — Encounter (HOSPITAL_COMMUNITY): Payer: Self-pay | Admitting: Orthopedic Surgery

## 2019-09-05 ENCOUNTER — Other Ambulatory Visit: Payer: Self-pay

## 2019-09-05 NOTE — Progress Notes (Signed)
Patient denies shortness of breath, fever, cough and chest pain.  Anesthesia review: Yes Jeneen Rinks, PA.  Jeneen Rinks made aware that patient is not taking any meds except pain med and multivitamin.  Jeneen Rinks advised me to complete PAT process like normal with instructions on DOS and he will call Dr [name] Given tomorrow (09/06/19) to make sure he is aware that patient has stopped taking all medications for the last month.      PCP - Dr Willette Alma Cardiologist - denies  Chest x-ray - denies EKG - DOS 09/07/19 Stress Test - denies ECHO - 06/24/18, ECHO TEE 06/28/18 Cardiac Cath - 06/24/18  Fasting Blood Sugar - Not checking blood sugar Checks Blood Sugar __0___ times a day Patient is not taking her diabetes medication.  Aspirin Instructions: Follow your surgeon's instructions on when to stop aspirin prior to surgery,  If no instructions were given by your surgeon then you will need to call the office for those instructions.  ERAS: Clears til 7:40 am DOS, no drink.  STOP now taking any Aspirin (unless otherwise instructed by your surgeon), Aleve, Naproxen, Ibuprofen, Motrin, Advil, Goody's, BC's, all herbal medications, fish oil, and all vitamins.   Coronavirus Screening Have you experienced the following symptoms:  Cough yes/no: No Fever (>100.46F)  yes/no: No Runny nose yes/no: No Sore throat yes/no: No Difficulty breathing/shortness of breath  yes/no: No  Have you traveled in the last 14 days and where? yes/no: No  Patient verbalized understanding of instructions that were given VIA PHONE.

## 2019-09-06 ENCOUNTER — Other Ambulatory Visit (HOSPITAL_COMMUNITY)
Admission: RE | Admit: 2019-09-06 | Discharge: 2019-09-06 | Disposition: A | Discharge status: Home / Self Care | Payer: BC Managed Care – PPO | Source: Ambulatory Visit | Attending: Orthopedic Surgery | Admitting: Orthopedic Surgery

## 2019-09-06 ENCOUNTER — Other Ambulatory Visit: Payer: Self-pay | Admitting: Physician Assistant

## 2019-09-06 DIAGNOSIS — Z01812 Encounter for preprocedural laboratory examination: Secondary | ICD-10-CM | POA: Diagnosis not present

## 2019-09-06 DIAGNOSIS — Z20828 Contact with and (suspected) exposure to other viral communicable diseases: Secondary | ICD-10-CM | POA: Diagnosis not present

## 2019-09-06 LAB — SARS CORONAVIRUS 2 (TAT 6-24 HRS): SARS Coronavirus 2: NEGATIVE

## 2019-09-06 NOTE — Anesthesia Preprocedure Evaluation (Deleted)
Anesthesia Evaluation  Patient identified by MRN, date of birth, ID band Patient awake    Reviewed: Allergy & Precautions, NPO status , Patient's Chart, lab work & pertinent test results  History of Anesthesia Complications Negative for: history of anesthetic complications  Airway        Dental   Pulmonary neg pulmonary ROS, former smoker,           Cardiovascular hypertension, Pt. on medications + CAD, + Past MI, + CABG (LIMA-LAD, SVG-D1-AL1, left RA-OM1 06/28/18), + Peripheral Vascular Disease and +CHF    Echo 06/24/18: mild LVH, EF 50-55%, anterior and anterolateral hypokinesis, grade 2 diastolic dysfunction, mild LAE   Neuro/Psych  Headaches, Anxiety Depression CVA (2014, 2017)    GI/Hepatic Neg liver ROS, GERD  ,  Endo/Other  diabetes, Poorly Controlled, Type 2Hypothyroidism   Renal/GU Renal InsufficiencyRenal disease  negative genitourinary   Musculoskeletal  (+) Arthritis ,   Abdominal   Peds  Hematology  (+) anemia ,   Anesthesia Other Findings Day of surgery medications reviewed with patient.  Reproductive/Obstetrics negative OB ROS                            Anesthesia Physical Anesthesia Plan  ASA: III  Anesthesia Plan: General   Post-op Pain Management: GA combined w/ Regional for post-op pain   Induction: Intravenous  PONV Risk Score and Plan: 3 and Treatment may vary due to age or medical condition, Ondansetron and Midazolam  Airway Management Planned: LMA  Additional Equipment: None  Intra-op Plan:   Post-operative Plan: Extubation in OR  Informed Consent:   Plan Discussed with:   Anesthesia Plan Comments: (CASE CANCELLED in preop. FSBS 362 and BP [id number] systolic. Pt has been noncompliant with all medications except pain medicine for at least the past week. She states she " has no energy" to take medications. Denies SI. Discussed with Dr. [name] Given. Pt states she will  contact PCP today for followup and take her BP medications and Norvasc when she gets home. Daiva Huge, MD )      Anesthesia Quick Evaluation

## 2019-09-06 NOTE — Progress Notes (Addendum)
Anesthesia Chart Review:  Case: [id number] Date/Time: 09/07/19 1025   Procedure: LEFT BELOW KNEE AMPUTATION (Left Knee)   Anesthesia type: Choice   Pre-op diagnosis: Gangrene Left Heel   Location: [facility] OR ROOM 03 / [facility]   Surgeons: [name], MD      DISCUSSION: Patient is a 53 year old female scheduled for the above procedure. She was scheduled for 08/26/19, but when she arrived she was emotionally distraught and did not feel she was emotionally ready to undergo left AKA. She is s/p right BKA 12/31/18.    History includes CAD (NSTEMI, s/p CABG: LIMA-LAD, SVG-D1-AL1, left RA-OM1 06/28/18; poor quality targets except LAD, conduits small but good quality, not a candidate for redo CABG by Op Note), chronic diastolic CHF, HTN, DM2 (with neuropathy), CVA (2014; [place] 2017), HLD, PAD (s/p right BKA 12/31/18; left great toe amputation 2018), hypothyroidism, former smoker (quit 2007).   Last seen by cardiologist Dr. [name] on 02/24/19 and continued medical therapy for CAD recommended.   She was scheduled for pre-surgical COVID-19 test on 09/06/19 at 9:55 AM, but as of 1:30 PM had not arrived. Also PAT RN spoke with patient (telephone visit) late on 09/05/19 for pre-operative interview. She reported said she felt like "giving up" and had stopped taking most of her meds (except pain meds, ASA, multivitamin) for about the past month. She mentioned to RN going to get her COVID test on 09/06/19, so she was aware of the appointment. I spoke with Malachy Mood at Dr. Jess Barters office. Patient will ask an orthopedic provider to follow-up with patient today to discuss patient's emotional readiness for surgery. Have asked that they also address medication non-compliance and consider if patient will need to be admitted or seen in the ED prior to surgery--she is currently scheduled as a morning case and would be at risk for delay or cancellation if presents with significant hyperglycemia, hypertension, etc.    VS: For day of  surgery.   Ht _0  (1.575 m)   Wt 68 kg   LMP 02/12/2011 (Exact Date)   BMI 27.44 kg/m  BP Readings from Last 3 Encounters:  02/24/19 132/79  01/15/19 (!) 159/85  01/05/19 128/76    PROVIDERS: Mosie Lukes, MD is PCP  Cherlynn Kaiser, MD is cardiologist ([facility]) [name], MD saw her on 08/25/19 at the [facility].   LABS: For day of surgery. As of 01/14/19, H/H 8.8, 27.8, PLT 613, Cr 1.21. A1c 10.5 on 12/12/18.    EKG: Based on anesthesia guidelines, she would be due for updated EKG on arrival. Last tracing 06/29/18.    CV: TEE (intra-op CABG) 06/28/18: Results: - Mitral valve: The mitral leaflets are mildly thickened.  The leaflets opened normally.  There was normal coaptation of the leaflets without prolapsing or flail leaflet segments.  There was trace mitral insufficiency. - Right ventricle: The RV was normal in size.  There is normal RV systolic function.  At the completion of the procedure following chest closure, there was a reduction in filling of the right ventricle.  The RV wall motion appeared unchanged. - Tricuspid valve: Tricuspid valve leaflets appeared structurally normal.  There was 1+ tricuspid insufficiency.  The tricuspid annulus was normal in diameter and measured 2.8 cm in the four-chamber view in diastole. - Pulmonic valve: Trace regurgitation.  Echo 06/24/18: Study Conclusions - Left ventricle: The cavity size was normal. Wall thickness was increased in a pattern of mild LVH. Systolic function was low normal to mildly  reduced. The estimated ejection fraction was in the range of 50% to 55%. Anterior and anterolateral hypokinesis. Features are consistent with a pseudonormal left ventricular filling pattern, with concomitant abnormal relaxation and increased filling pressure (grade 2 diastolic dysfunction). - Aortic valve: There was no stenosis. - Mitral valve: There was no significant regurgitation. - Left atrium: The atrium  was mildly dilated. - Right ventricle: The cavity size was normal. Systolic function was normal. - Pulmonary arteries: No complete TR doppler jet so unable to estimate PA systolic pressure. - Systemic veins: IVC measured 1.8 cm with < 50% respirophasic variation, suggesting RA pressure 8 mmHg. Impressions: - Normal LV size with mild LV hypertrophy. EF 50-55%, anterior and anterolateral hypokinesis. Normal RV size and systolic function. No significant valvular abnormalities.  Carotid duplex 06/25/18:  Summary: Right Carotid: Velocities in the right ICA are consistent with a 1-39% stenosis. Left Carotid: Velocities in the left ICA are consistent with a 1-39% stenosis. Vertebrals: Bilateral vertebral arteries demonstrate antegrade flow. Subclavians: Normal flow hemodynamics were seen in bilateral subclavian arteries.  Last cardiac cath was 06/24/18 prior to CABG.  Past Medical History:  Diagnosis Date  . Abscess of great toe, right   . Acute on chronic diastolic CHF (congestive heart failure) ([place]) 06/24/2018  . Acute osteomyelitis of toe, left ([place]) 09/05/2016  . Amputation of right great toe ([place]) 12/22/2018  . Anxiety   . Cataract    left - surgery to remove  . Cellulitis of foot, right 12/11/2018  . COMMON MIGRAINE 10/07/2010  . Decreased visual acuity 11/10/2016  . Depression 12/18/2012  . Diabetes mellitus type II, uncontrolled ([place]) 10/07/2010   Qualifier: Diagnosis of  By: Charlett Blake MD, [name]    . Diabetic foot infection ([place]) 08/26/2016  . Diabetic infection of right foot ([place])   . Disturbance of skin sensation 10/07/2010  . Gangrene of right foot ([place])   . GERD (gastroesophageal reflux disease)   . Heart murmur   . History of kidney stones    "years ago" - passed stones  . Hyperlipidemia 12/06/2010  . Hypertension   . Hypothyroidism   . Lipoma of abdominal wall 10/05/2016  . Overweight(278.02) 12/06/2010  . PERIPHERAL NEUROPATHY, FEET 10/07/2010  .  PVD (peripheral vascular disease) ([place]) 01/21/2012  . RESTLESS LEG SYNDROME 10/25/2010  . Stroke [facility]) 2014, 2017   most recently in 2/17 - intracerebral hemorrhage    Past Surgical History:  Procedure Laterality Date  . AMPUTATION Right 12/13/2018   Procedure: AMPUTATION RIGHT GREAT TOE, LOCAL RELOCATION OF TISSUE FOR WOUND CLOSURE 9cm x 3cm, VAC APPLICATION;  Surgeon: [name], MD;  Location: [place];  Service: Orthopedics;  Laterality: Right;  . AMPUTATION Right 12/31/2018   Procedure: RIGHT BELOW KNEE AMPUTATION;  Surgeon: [name], MD;  Location: [place];  Service: Orthopedics;  Laterality: Right;  . AMPUTATION TOE Left 09/09/2016   Procedure: AMPUTATION OF LEFT GREAT TOE;  Surgeon: Milly Jakob, MD;  Location: [place];  Service: Orthopedics;  Laterality: Left;  . CARDIAC CATHETERIZATION  06/24/2018  . CORONARY ARTERY BYPASS GRAFT N/A 06/28/2018   Procedure: CORONARY ARTERY BYPASS GRAFTING (CABG) times  four, using left internal mammary artery, endoscopically harvested right saphenous vein, and harvested left radial artery;  Surgeon: [name], MD;  Location: [place];  Service: Open Heart Surgery;  Laterality: N/A;  . ENDOVEIN HARVEST OF GREATER SAPHENOUS VEIN Right 06/28/2018   Procedure: ENDOVEIN HARVEST OF GREATER SAPHENOUS VEIN;  Surgeon: [name],  MD;  Location: [facility];  Service: Open Heart Surgery;  Laterality: Right;  . EYE SURGERY Left 06/2017   [facility]  . LEFT HEART CATH AND CORONARY ANGIOGRAPHY N/A 06/24/2018   Procedure: LEFT HEART CATH AND CORONARY ANGIOGRAPHY;  Surgeon: Troy Sine, MD;  Location: [place] [facility];  Service: Cardiovascular;  Laterality: N/A;  . RADIAL ARTERY HARVEST Left 06/28/2018   Procedure: RADIAL ARTERY HARVEST;  Surgeon: [name], MD;  Location: [place];  Service: Open Heart Surgery;  Laterality: Left;  . TEE WITHOUT CARDIOVERSION N/A 06/28/2018   Procedure: TRANSESOPHAGEAL ECHOCARDIOGRAM  (TEE);  Surgeon: [name], MD;  Location: [place];  Service: Open Heart Surgery;  Laterality: N/A;  . WISDOM TOOTH EXTRACTION      MEDICATIONS: No current facility-administered medications for this encounter.   . Multiple Vitamin (MULTIVITAMIN WITH MINERALS) TABS tablet  . acetaminophen (TYLENOL) 325 MG tablet  . amitriptyline (ELAVIL) 10 MG tablet  . amLODipine (NORVASC) 5 MG tablet  . aspirin EC 81 MG tablet  . atorvastatin (LIPITOR) 80 MG tablet  . azelastine (ASTELIN) 0.1 % nasal spray  . Blood Glucose Monitoring Suppl (ONE TOUCH ULTRA 2) w/Device KIT  . cadexomer iodine (IODOSORB) 0.9 % gel  . docusate sodium (COLACE) 100 MG capsule  . doxycycline (VIBRA-TABS) 100 MG tablet  . doxycycline (VIBRAMYCIN) 50 MG capsule  . escitalopram (LEXAPRO) 10 MG tablet  . fluticasone (FLONASE) 50 MCG/ACT nasal spray  . Insulin Glargine (LANTUS) 100 UNIT/ML Solostar Pen  . Insulin Pen Needle (PEN NEEDLES) 31G X 5 MM MISC  . insulin regular (HUMULIN R) 100 units/mL injection  . isosorbide mononitrate (IMDUR) 30 MG 24 hr tablet  . levothyroxine (SYNTHROID) 25 MCG tablet  . metoCLOPramide (REGLAN) 5 MG tablet  . metoprolol tartrate (LOPRESSOR) 50 MG tablet  . ondansetron (ZOFRAN) 4 MG tablet  . ONE TOUCH ULTRA TEST test strip  . pantoprazole (PROTONIX) 40 MG tablet  . pentoxifylline (TRENTAL) 400 MG CR tablet  . silver sulfADIAZINE (SILVADENE) 1 % cream    Myra Gianotti, PA-C Surgical Short Stay/Anesthesiology [facility] Phone [phone] [facility] Phone [phone] 09/06/2019 1:36 PM

## 2019-09-07 ENCOUNTER — Encounter (HOSPITAL_COMMUNITY): Payer: Self-pay | Admitting: Orthopedic Surgery

## 2019-09-07 ENCOUNTER — Telehealth: Payer: Self-pay

## 2019-09-07 ENCOUNTER — Ambulatory Visit (HOSPITAL_COMMUNITY)
Admission: RE | Admit: 2019-09-07 | Discharge: 2019-09-07 | Disposition: A | Discharge status: Home / Self Care | Payer: BC Managed Care – PPO | Attending: Orthopedic Surgery | Admitting: Orthopedic Surgery

## 2019-09-07 ENCOUNTER — Encounter (HOSPITAL_COMMUNITY)
Admission: RE | Disposition: A | Discharge status: Home / Self Care | Payer: Self-pay | Source: Home / Self Care | Attending: Orthopedic Surgery

## 2019-09-07 DIAGNOSIS — E1142 Type 2 diabetes mellitus with diabetic polyneuropathy: Secondary | ICD-10-CM | POA: Diagnosis not present

## 2019-09-07 DIAGNOSIS — E11621 Type 2 diabetes mellitus with foot ulcer: Secondary | ICD-10-CM | POA: Diagnosis not present

## 2019-09-07 DIAGNOSIS — E785 Hyperlipidemia, unspecified: Secondary | ICD-10-CM | POA: Diagnosis not present

## 2019-09-07 DIAGNOSIS — K219 Gastro-esophageal reflux disease without esophagitis: Secondary | ICD-10-CM | POA: Diagnosis not present

## 2019-09-07 DIAGNOSIS — I2510 Atherosclerotic heart disease of native coronary artery without angina pectoris: Secondary | ICD-10-CM | POA: Insufficient documentation

## 2019-09-07 DIAGNOSIS — I5032 Chronic diastolic (congestive) heart failure: Secondary | ICD-10-CM | POA: Diagnosis not present

## 2019-09-07 DIAGNOSIS — Z8673 Personal history of transient ischemic attack (TIA), and cerebral infarction without residual deficits: Secondary | ICD-10-CM | POA: Insufficient documentation

## 2019-09-07 DIAGNOSIS — F329 Major depressive disorder, single episode, unspecified: Secondary | ICD-10-CM | POA: Insufficient documentation

## 2019-09-07 DIAGNOSIS — F419 Anxiety disorder, unspecified: Secondary | ICD-10-CM | POA: Diagnosis not present

## 2019-09-07 DIAGNOSIS — Z89412 Acquired absence of left great toe: Secondary | ICD-10-CM | POA: Diagnosis not present

## 2019-09-07 DIAGNOSIS — Z6827 Body mass index (BMI) 27.0-27.9, adult: Secondary | ICD-10-CM | POA: Insufficient documentation

## 2019-09-07 DIAGNOSIS — T50906A Underdosing of unspecified drugs, medicaments and biological substances, initial encounter: Secondary | ICD-10-CM | POA: Diagnosis not present

## 2019-09-07 DIAGNOSIS — E039 Hypothyroidism, unspecified: Secondary | ICD-10-CM | POA: Insufficient documentation

## 2019-09-07 DIAGNOSIS — Z91128 Patient's intentional underdosing of medication regimen for other reason: Secondary | ICD-10-CM | POA: Diagnosis not present

## 2019-09-07 DIAGNOSIS — I110 Hypertensive heart disease with heart failure: Secondary | ICD-10-CM | POA: Diagnosis not present

## 2019-09-07 DIAGNOSIS — Z791 Long term (current) use of non-steroidal anti-inflammatories (NSAID): Secondary | ICD-10-CM | POA: Insufficient documentation

## 2019-09-07 DIAGNOSIS — Z79890 Hormone replacement therapy (postmenopausal): Secondary | ICD-10-CM | POA: Diagnosis not present

## 2019-09-07 DIAGNOSIS — E663 Overweight: Secondary | ICD-10-CM | POA: Diagnosis not present

## 2019-09-07 DIAGNOSIS — I96 Gangrene, not elsewhere classified: Secondary | ICD-10-CM | POA: Diagnosis not present

## 2019-09-07 DIAGNOSIS — E1169 Type 2 diabetes mellitus with other specified complication: Secondary | ICD-10-CM | POA: Diagnosis not present

## 2019-09-07 DIAGNOSIS — L97424 Non-pressure chronic ulcer of left heel and midfoot with necrosis of bone: Secondary | ICD-10-CM | POA: Diagnosis not present

## 2019-09-07 DIAGNOSIS — Z538 Procedure and treatment not carried out for other reasons: Secondary | ICD-10-CM | POA: Insufficient documentation

## 2019-09-07 DIAGNOSIS — Z87891 Personal history of nicotine dependence: Secondary | ICD-10-CM | POA: Insufficient documentation

## 2019-09-07 DIAGNOSIS — E1152 Type 2 diabetes mellitus with diabetic peripheral angiopathy with gangrene: Secondary | ICD-10-CM | POA: Insufficient documentation

## 2019-09-07 DIAGNOSIS — E43 Unspecified severe protein-calorie malnutrition: Secondary | ICD-10-CM | POA: Diagnosis not present

## 2019-09-07 DIAGNOSIS — M86272 Subacute osteomyelitis, left ankle and foot: Secondary | ICD-10-CM | POA: Diagnosis not present

## 2019-09-07 DIAGNOSIS — Z7982 Long term (current) use of aspirin: Secondary | ICD-10-CM | POA: Insufficient documentation

## 2019-09-07 DIAGNOSIS — Z951 Presence of aortocoronary bypass graft: Secondary | ICD-10-CM | POA: Insufficient documentation

## 2019-09-07 DIAGNOSIS — Z89511 Acquired absence of right leg below knee: Secondary | ICD-10-CM | POA: Diagnosis not present

## 2019-09-07 DIAGNOSIS — Z79899 Other long term (current) drug therapy: Secondary | ICD-10-CM | POA: Insufficient documentation

## 2019-09-07 HISTORY — DX: Gastro-esophageal reflux disease without esophagitis: K21.9

## 2019-09-07 HISTORY — DX: Unspecified cataract: H26.9

## 2019-09-07 LAB — GLUCOSE, CAPILLARY: Glucose-Capillary: 362 mg/dL — ABNORMAL HIGH (ref 70–99)

## 2019-09-07 SURGERY — AMPUTATION BELOW KNEE
Anesthesia: Choice | Site: Knee | Laterality: Left

## 2019-09-07 MED ORDER — CLINDAMYCIN PHOSPHATE 900 MG/50ML IV SOLN: 900.0000 mg | INTRAVENOUS | Status: DC

## 2019-09-07 MED ORDER — CHLORHEXIDINE GLUCONATE 4 % EX LIQD: 60.0000 mL | Freq: Once | CUTANEOUS | Status: DC

## 2019-09-07 MED ORDER — LACTATED RINGERS IV SOLN: INTRAVENOUS | Status: DC

## 2019-09-07 NOTE — Telephone Encounter (Signed)
Pt canceled surgery today and per Dr. [name] Given called and made an appt with Dr. [name] Given for Thursday. I advised the pt that if at any time she is having N/V/D fever, chills or sweats to proceed to the ER. To continue doing what she has been doing and looking at the area daily. Keeping it clean and covered. If all is ok she will come in on 09/15/19 for follow up. To call with any questions.

## 2019-09-07 NOTE — Progress Notes (Signed)
Patient arrived to short stay with elevated BP 219/113 and CBG 362.  Dr. Daiva Huge notified.  She is to speak with Dr. [name] Given and patient.

## 2019-09-07 NOTE — H&P (Signed)
[name] is an 53 y.o. female.   Chief Complaint: Left foot osteomyelitis HPI: Patient is a 53 year old woman who is seen in follow-up for left heel ulcer.  Patient complains of significant pain odor and drainage.  She has been on doxycycline for 2 weeks.  She is status post a right transtibial amputation.  Patient is working with Museum/gallery curator for the prosthesis on the right leg.  Patient complains of nausea and vomiting.  Past Medical History:  Diagnosis Date  . Abscess of great toe, right   . Acute on chronic diastolic CHF (congestive heart failure) ([place]) 06/24/2018  . Acute osteomyelitis of toe, left ([place]) 09/05/2016  . Amputation of right great toe ([place]) 12/22/2018  . Anxiety   . Cataract    left - surgery to remove  . Cellulitis of foot, right 12/11/2018  . COMMON MIGRAINE 10/07/2010  . Decreased visual acuity 11/10/2016  . Depression 12/18/2012  . Diabetes mellitus type II, uncontrolled ([place]) 10/07/2010   Qualifier: Diagnosis of  By: Charlett Blake MD, [name]    . Diabetic foot infection ([place]) 08/26/2016  . Diabetic infection of right foot ([place])   . Disturbance of skin sensation 10/07/2010  . Gangrene of right foot ([place])   . GERD (gastroesophageal reflux disease)   . Heart murmur   . History of kidney stones    "years ago" - passed stones  . Hyperlipidemia 12/06/2010  . Hypertension   . Hypothyroidism   . Lipoma of abdominal wall 10/05/2016  . Overweight(278.02) 12/06/2010  . PERIPHERAL NEUROPATHY, FEET 10/07/2010  . PVD (peripheral vascular disease) ([place]) 01/21/2012  . RESTLESS LEG SYNDROME 10/25/2010  . Stroke [facility]) 2014, 2017   most recently in 2/17 - intracerebral hemorrhage    Past Surgical History:  Procedure Laterality Date  . AMPUTATION Right 12/13/2018   Procedure: AMPUTATION RIGHT GREAT TOE, LOCAL RELOCATION OF TISSUE FOR WOUND CLOSURE 9cm x 3cm, VAC APPLICATION;  Surgeon: [name], MD;  Location: [place];  Service: Orthopedics;  Laterality: Right;  . AMPUTATION Right  12/31/2018   Procedure: RIGHT BELOW KNEE AMPUTATION;  Surgeon: [name], MD;  Location: [place];  Service: Orthopedics;  Laterality: Right;  . AMPUTATION TOE Left 09/09/2016   Procedure: AMPUTATION OF LEFT GREAT TOE;  Surgeon: Milly Jakob, MD;  Location: [place];  Service: Orthopedics;  Laterality: Left;  . CARDIAC CATHETERIZATION  06/24/2018  . CORONARY ARTERY BYPASS GRAFT N/A 06/28/2018   Procedure: CORONARY ARTERY BYPASS GRAFTING (CABG) times  four, using left internal mammary artery, endoscopically harvested right saphenous vein, and harvested left radial artery;  Surgeon: [name], MD;  Location: [place];  Service: Open Heart Surgery;  Laterality: N/A;  . ENDOVEIN HARVEST OF GREATER SAPHENOUS VEIN Right 06/28/2018   Procedure: ENDOVEIN HARVEST OF GREATER SAPHENOUS VEIN;  Surgeon: [name], MD;  Location: [place];  Service: Open Heart Surgery;  Laterality: Right;  . EYE SURGERY Left 06/2017   [facility]  . LEFT HEART CATH AND CORONARY ANGIOGRAPHY N/A 06/24/2018   Procedure: LEFT HEART CATH AND CORONARY ANGIOGRAPHY;  Surgeon: Troy Sine, MD;  Location: [place] [facility];  Service: Cardiovascular;  Laterality: N/A;  . RADIAL ARTERY HARVEST Left 06/28/2018   Procedure: RADIAL ARTERY HARVEST;  Surgeon: [name], MD;  Location: [place];  Service: Open Heart Surgery;  Laterality: Left;  . TEE WITHOUT CARDIOVERSION N/A 06/28/2018   Procedure: TRANSESOPHAGEAL ECHOCARDIOGRAM (TEE);  Surgeon: [name], MD;  Location: [place];  Service: Open Heart Surgery;  Laterality: N/A;  . WISDOM TOOTH EXTRACTION      Family History  Problem Relation Age of Onset  . Arthritis Mother   . Stroke Brother        previous smoker  . Alcohol abuse Brother        in remission  . Leukemia Brother   . Diabetes Paternal Grandmother   . Healthy Son    Social History:  reports that she quit smoking about 14 years ago. She smoked 0.50 packs per day. She  has never used smokeless tobacco. She reports previous drug use. She reports that she does not drink alcohol.  Allergies:  Allergies  Allergen Reactions  . Penicillins Other (See Comments)    made pt feel sick  Did it involve swelling of the face/tongue/throat, SOB, or low BP? No Did it involve sudden or severe rash/hives, skin peeling, or any reaction on the inside of your mouth or nose? No Did you need to seek medical attention at a hospital or doctor's office? No When did it last happen?April 2020 If all above answers are "NO", may proceed with cephalosporin use.   Recardo Evangelist [Pregabalin] Other (See Comments)    MADE PATIENT VERY EMOTIONAL AND WOULD CRY EASILY  . Morphine And Related Nausea And Vomiting  . Tramadol Nausea And Vomiting    Pt cant tolerate this pain med.     No medications prior to admission.    Results for orders placed or performed during the hospital encounter of 09/06/19 (from the past 48 hour(s))  SARS CORONAVIRUS 2 (TAT 6-24 HRS) Nasopharyngeal Nasopharyngeal Swab     Status: None   Collection Time: 09/06/19  1:46 PM   Specimen: Nasopharyngeal Swab  Result Value Ref Range   SARS Coronavirus 2 NEGATIVE NEGATIVE    Comment: (NOTE) SARS-CoV-2 target nucleic acids are NOT DETECTED. The SARS-CoV-2 RNA is generally detectable in upper and lower respiratory specimens during the acute phase of infection. Negative results do not preclude SARS-CoV-2 infection, do not rule out co-infections with other pathogens, and should not be used as the sole basis for treatment or other patient management decisions. Negative results must be combined with clinical observations, patient history, and epidemiological information. The expected result is Negative. Fact Sheet for Patients: SugarRoll.be Fact Sheet for Healthcare Providers: https://www.woods-mathews.com/ This test is not yet approved or cleared by the [place] FDA  and  has been authorized for detection and/or diagnosis of SARS-CoV-2 by FDA under an Emergency Use Authorization (EUA). This EUA will remain  in effect (meaning this test can be used) for the duration of the COVID-19 declaration under Section 56 4(b)(1) of the Act, 21 U.S.C. section 360bbb-3(b)(1), unless the authorization is terminated or revoked sooner. Performed at [facility], [place] [address] [place], [place] [address]    No results found.  Review of Systems  All other systems reviewed and are negative.   Height 5\' 2"  (1.575 m), weight 68 kg, last menstrual period 02/12/2011. Physical Exam  Patient is alert, oriented, no adenopathy, well-dressed, normal affect, normal respiratory effort. Examination patient's heel is extremely tender to palpation there is cellulitis there is foul-smelling purulent drainage.  The ulcer probes to bone.  She does have a palpable dorsalis pedis pulse the Doppler was used and she has a biphasic dorsalis pedis and posterior tibial pulse.   Assessment/Plan 1. Acquired absence of right leg below knee (HCC)   2. Subacute osteomyelitis, left ankle and foot ([place])  3. Lower limb ulcer, heel or midfoot, left, with necrosis of bone ([place])   4. Severe protein-calorie malnutrition ([place])     Plan: With exposed bone cellulitis necrotic wound and purulent drainage patient best option is to proceed with a left transtibial amputation.  Recommended proceeding with surgery this week patient states she would like to proceed on Friday risks and benefits were discussed including risk of the wound not healing and need for additional surgery.   Bevely Palmer Chinyere Galiano, PA 09/07/2019, 6:51 AM

## 2019-09-07 NOTE — Progress Notes (Signed)
Surgery canceled.  Dr. Daiva Huge advised patient to restart medications and follow up with her primary doctor to get CBG and BP better controlled.  Dr. Jess Barters office to get in touch with the patient to reschedule surgery.  Patient was assisted in getting changed into clothes and taken via wheelchair to main entrance A where she left with family.  No IV was started.

## 2019-09-15 ENCOUNTER — Encounter: Payer: Self-pay | Admitting: Orthopedic Surgery

## 2019-09-15 ENCOUNTER — Ambulatory Visit (INDEPENDENT_AMBULATORY_CARE_PROVIDER_SITE_OTHER): Payer: BC Managed Care – PPO | Admitting: Orthopedic Surgery

## 2019-09-15 ENCOUNTER — Other Ambulatory Visit: Payer: Self-pay

## 2019-09-15 DIAGNOSIS — S81802D Unspecified open wound, left lower leg, subsequent encounter: Secondary | ICD-10-CM

## 2019-09-15 NOTE — Progress Notes (Signed)
Office Visit Note   Patient: [name]           Date of Birth: Oct 14, 1965           MRN: [id number] Visit Date: 09/15/2019              Requested by: Mosie Lukes, MD [address] [place],  [place] [id number] PCP: Mosie Lukes, MD  Chief Complaint  Patient presents with  . Right Knee - Pain      HPI: This is a pleasant 54 year old woman who is status post right below-knee amputation.  She now has osteomyelitis in her left heel.  She has been scheduled for a left below-knee amputation most recently about a week ago.  She did have some blood pressure issues which she has discussed with her primary care doctor.  She would like to go forward now with her amputation as soon as possible  Assessment & Plan: Visit Diagnoses: No diagnosis found.  Plan: We will plan for left below-knee amputation next week.  I reviewed the risks with her which include but not limited to bleeding infection revision surgery we also reviewed the surgical recovery outcomes and expectations Follow-Up Instructions: No follow-ups on file.   Ortho Exam  Patient is alert, oriented, no adenopathy, well-dressed, normal affect, normal respiratory effort. Focused exam of her left foot demonstrates necrotic left heel ulcer.  No surrounding significant cellulitis swelling is well controlled mild to moderate drainage  Imaging: No results found. No images are attached to the encounter.  Labs: Lab Results  Component Value Date   HGBA1C 10.5 (H) 12/12/2018   HGBA1C 11.1 (H) 06/26/2018   HGBA1C 9.5 (H) 11/17/2017   ESRSEDRATE 130 (H) 12/12/2018   ESRSEDRATE 115 (H) 06/24/2018   ESRSEDRATE 107 (H) 09/04/2016   CRP 28.2 (H) 12/12/2018   CRP 7.2 (H) 09/04/2016   CRP 0.7 03/21/2015   LABURIC 3.2 06/13/2014   LABURIC 3.1 02/09/2014   REPTSTATUS 12/18/2018 FINAL 12/13/2018   GRAMSTAIN  12/13/2018    FEW WBC PRESENT,BOTH PMN AND MONONUCLEAR NO ORGANISMS SEEN    CULT  12/13/2018    FEW  STAPHYLOCOCCUS SIMULANS CRITICAL RESULT CALLED TO, READ BACK BY AND VERIFIED WITH: RN N FELIX [id number] AT 29 AM BY CM NO ANAEROBES ISOLATED Performed at [facility], [place] [address], [place], [place] [address]    LABORGA STAPHYLOCOCCUS SIMULANS 12/13/2018     Lab Results  Component Value Date   ALBUMIN 1.8 (L) 01/06/2019   ALBUMIN 2.4 (L) 12/11/2018   ALBUMIN 3.4 (L) 07/20/2018   PREALBUMIN 12.7 (L) 09/04/2016   LABURIC 3.2 06/13/2014   LABURIC 3.1 02/09/2014    Lab Results  Component Value Date   MG 2.1 12/16/2018   MG 2.8 (H) 06/29/2018   MG 3.0 (H) 06/29/2018   No results found for: [facility]  Lab Results  Component Value Date   PREALBUMIN 12.7 (L) 09/04/2016   CBC EXTENDED Latest Ref Rng & Units 01/14/2019 01/10/2019 01/06/2019  WBC 4.0 - 10.5 K/uL 9.1 12.1(H) 10.1  RBC 3.87 - 5.11 MIL/uL 3.34(L) 3.59(L) 3.24(L)  HGB 12.0 - 15.0 g/dL 8.8(L) 9.4(L) 8.7(L)  HCT 36.0 - 46.0 % 27.8(L) 29.7(L) 26.6(L)  PLT 150 - 400 K/uL 613(H) 699(H) 566(H)  NEUTROABS 1.7 - 7.7 K/uL - 8.7(H) 7.0  LYMPHSABS 0.7 - 4.0 K/uL - 2.0 1.5     There is no height or weight on file to calculate BMI.  Orders:  No orders of the defined types were placed in this encounter.  No orders of the defined types were placed in this encounter.    Procedures: No procedures performed  Clinical Data: No additional findings.  ROS:  All other systems negative, except as noted in the HPI. Review of Systems  Objective: Vital Signs: LMP 02/12/2011 (Exact Date)   Specialty Comments:  No specialty comments available.  PMFS History: Patient Active Problem List   Diagnosis Date Noted  . Pressure injury of left heel, unstageable ([place]) 02/09/2019  . Hypoglycemia   . Diabetes mellitus type 2 in nonobese (HCC)   . Labile blood pressure   . Labile blood glucose   . Phantom limb pain ([place])   . CKD (chronic kidney disease), stage III   . Hypothyroidism   . Essential hypertension   . Chronic  diastolic congestive heart failure ([place])   . Coronary artery disease involving coronary bypass graft of native heart without angina pectoris   . Poorly controlled type 2 diabetes mellitus with peripheral neuropathy ([place])   . Acute on chronic anemia   . History of below knee amputation, right ([place]) 12/31/2018  . S/P CABG x 3 06/28/2018  . Coronary artery disease involving native coronary artery of native heart with angina pectoris ([place]) 06/25/2018  . Type 2 diabetes mellitus with vascular disease ([place]) 06/24/2018  . Acute on chronic diastolic CHF (congestive heart failure) ([place]) 06/24/2018  . Non-ST elevation (NSTEMI) myocardial infarction ([place]) 06/24/2018  . Chest pain 06/23/2018  . Glaucoma 09/17/2017  . Diabetic retinopathy ([place]) 09/17/2017  . Otitis media 11/10/2016  . Decreased visual acuity 11/10/2016  . Spinal stenosis, lumbar region with neurogenic claudication 10/22/2016  . Other spondylosis with radiculopathy, lumbar region 10/13/2016  . Lipoma of abdominal wall 10/05/2016  . Severe protein-calorie malnutrition ([place]) 09/04/2016  . Anemia of chronic disease 09/04/2016  . Diabetic polyneuropathy associated with type 2 diabetes mellitus ([place]) 05/07/2016  . Orthostatic hypotension 05/07/2016  . Cerebrovascular disease 04/27/2016  . N&V (nausea and vomiting) 04/01/2016  . ICH (intracerebral hemorrhage) ([place]) 03/30/2016  . Visit for preventive health examination 05/21/2015  . Breast cancer screening 05/21/2015  . Arm lesion 07/02/2014  . Pain in limb 01/13/2014  . Neuropathic pain of both legs 01/13/2014  . Hip pain 01/12/2014  . Allergic rhinitis 03/10/2013  . Back pain 03/08/2013  . Depression 12/18/2012  . PVD (peripheral vascular disease) ([place]) 01/21/2012  . Hyperlipidemia 12/06/2010  . Overweight(278.02) 12/06/2010  . RESTLESS LEG SYNDROME 10/25/2010  . Migraine without aura 10/07/2010  . Hereditary and idiopathic peripheral neuropathy 10/07/2010  . Essential  hypertension, benign 10/07/2010  . DISTURBANCE OF SKIN SENSATION 10/07/2010  . HEART MURMUR, HX OF 10/07/2010  . NEPHROLITHIASIS, HX OF 10/07/2010   Past Medical History:  Diagnosis Date  . Abscess of great toe, right   . Acute on chronic diastolic CHF (congestive heart failure) ([place]) 06/24/2018  . Acute osteomyelitis of toe, left ([place]) 09/05/2016  . Amputation of right great toe ([place]) 12/22/2018  . Anxiety   . Cataract    left - surgery to remove  . Cellulitis of foot, right 12/11/2018  . COMMON MIGRAINE 10/07/2010  . Decreased visual acuity 11/10/2016  . Depression 12/18/2012  . Diabetes mellitus type II, uncontrolled ([place]) 10/07/2010   Qualifier: Diagnosis of  By: Charlett Blake MD, [name]    . Diabetic foot infection ([place]) 08/26/2016  . Diabetic infection of right foot ([place])   . Disturbance of skin sensation 10/07/2010  .  Gangrene of right foot ([place])   . GERD (gastroesophageal reflux disease)   . Heart murmur   . History of kidney stones    "years ago" - passed stones  . Hyperlipidemia 12/06/2010  . Hypertension   . Hypothyroidism   . Lipoma of abdominal wall 10/05/2016  . Overweight(278.02) 12/06/2010  . PERIPHERAL NEUROPATHY, FEET 10/07/2010  . PVD (peripheral vascular disease) ([place]) 01/21/2012  . RESTLESS LEG SYNDROME 10/25/2010  . Stroke [facility]) 2014, 2017   most recently in 2/17 - intracerebral hemorrhage    Family History  Problem Relation Age of Onset  . Arthritis Mother   . Stroke Brother        previous smoker  . Alcohol abuse Brother        in remission  . Leukemia Brother   . Diabetes Paternal Grandmother   . Healthy Son     Past Surgical History:  Procedure Laterality Date  . AMPUTATION Right 12/13/2018   Procedure: AMPUTATION RIGHT GREAT TOE, LOCAL RELOCATION OF TISSUE FOR WOUND CLOSURE 9cm x 3cm, VAC APPLICATION;  Surgeon: [name], MD;  Location: [place];  Service: Orthopedics;  Laterality: Right;  . AMPUTATION Right 12/31/2018   Procedure: RIGHT BELOW KNEE  AMPUTATION;  Surgeon: [name], MD;  Location: [place];  Service: Orthopedics;  Laterality: Right;  . AMPUTATION TOE Left 09/09/2016   Procedure: AMPUTATION OF LEFT GREAT TOE;  Surgeon: Milly Jakob, MD;  Location: [place];  Service: Orthopedics;  Laterality: Left;  . CARDIAC CATHETERIZATION  06/24/2018  . CORONARY ARTERY BYPASS GRAFT N/A 06/28/2018   Procedure: CORONARY ARTERY BYPASS GRAFTING (CABG) times  four, using left internal mammary artery, endoscopically harvested right saphenous vein, and harvested left radial artery;  Surgeon: [name], MD;  Location: [place];  Service: Open Heart Surgery;  Laterality: N/A;  . ENDOVEIN HARVEST OF GREATER SAPHENOUS VEIN Right 06/28/2018   Procedure: ENDOVEIN HARVEST OF GREATER SAPHENOUS VEIN;  Surgeon: [name], MD;  Location: [place];  Service: Open Heart Surgery;  Laterality: Right;  . EYE SURGERY Left 06/2017   [facility]  . LEFT HEART CATH AND CORONARY ANGIOGRAPHY N/A 06/24/2018   Procedure: LEFT HEART CATH AND CORONARY ANGIOGRAPHY;  Surgeon: Troy Sine, MD;  Location: [facility];  Service: Cardiovascular;  Laterality: N/A;  . RADIAL ARTERY HARVEST Left 06/28/2018   Procedure: RADIAL ARTERY HARVEST;  Surgeon: [name], MD;  Location: [place];  Service: Open Heart Surgery;  Laterality: Left;  . TEE WITHOUT CARDIOVERSION N/A 06/28/2018   Procedure: TRANSESOPHAGEAL ECHOCARDIOGRAM (TEE);  Surgeon: [name], MD;  Location: [place];  Service: Open Heart Surgery;  Laterality: N/A;  . WISDOM TOOTH EXTRACTION     Social History   Occupational History  . Occupation: Glass blower/designer  Tobacco Use  . Smoking status: Former Smoker    Packs/day: 0.50    Quit date: 09/08/2005    Years since quitting: 14.0  . Smokeless tobacco: Never Used  Substance and Sexual Activity  . Alcohol use: No    Alcohol/week: 0.0 standard drinks  . Drug use: Not Currently  . Sexual activity: Yes     Partners: Male    Birth control/protection: Post-menopausal

## 2019-09-16 ENCOUNTER — Other Ambulatory Visit: Payer: Self-pay

## 2019-09-16 DIAGNOSIS — S88111A Complete traumatic amputation at level between knee and ankle, right lower leg, initial encounter: Secondary | ICD-10-CM | POA: Diagnosis not present

## 2019-09-16 DIAGNOSIS — I5032 Chronic diastolic (congestive) heart failure: Secondary | ICD-10-CM | POA: Diagnosis not present

## 2019-09-20 ENCOUNTER — Encounter (HOSPITAL_COMMUNITY): Payer: Self-pay | Admitting: Orthopedic Surgery

## 2019-09-20 ENCOUNTER — Other Ambulatory Visit: Payer: Self-pay | Admitting: Physician Assistant

## 2019-09-20 ENCOUNTER — Other Ambulatory Visit (HOSPITAL_COMMUNITY)
Admission: RE | Admit: 2019-09-20 | Discharge: 2019-09-20 | Disposition: A | Discharge status: Home / Self Care | Payer: BC Managed Care – PPO | Source: Ambulatory Visit | Attending: Orthopedic Surgery | Admitting: Orthopedic Surgery

## 2019-09-20 ENCOUNTER — Other Ambulatory Visit: Payer: Self-pay

## 2019-09-20 DIAGNOSIS — L02416 Cutaneous abscess of left lower limb: Secondary | ICD-10-CM | POA: Diagnosis not present

## 2019-09-20 DIAGNOSIS — E785 Hyperlipidemia, unspecified: Secondary | ICD-10-CM | POA: Diagnosis not present

## 2019-09-20 DIAGNOSIS — N390 Urinary tract infection, site not specified: Secondary | ICD-10-CM | POA: Diagnosis not present

## 2019-09-20 DIAGNOSIS — M86272 Subacute osteomyelitis, left ankle and foot: Secondary | ICD-10-CM | POA: Diagnosis not present

## 2019-09-20 DIAGNOSIS — E1152 Type 2 diabetes mellitus with diabetic peripheral angiopathy with gangrene: Secondary | ICD-10-CM | POA: Diagnosis not present

## 2019-09-20 DIAGNOSIS — I5033 Acute on chronic diastolic (congestive) heart failure: Secondary | ICD-10-CM | POA: Diagnosis not present

## 2019-09-20 DIAGNOSIS — Z89511 Acquired absence of right leg below knee: Secondary | ICD-10-CM | POA: Diagnosis not present

## 2019-09-20 DIAGNOSIS — I5032 Chronic diastolic (congestive) heart failure: Secondary | ICD-10-CM | POA: Diagnosis not present

## 2019-09-20 DIAGNOSIS — F419 Anxiety disorder, unspecified: Secondary | ICD-10-CM | POA: Diagnosis not present

## 2019-09-20 DIAGNOSIS — G8918 Other acute postprocedural pain: Secondary | ICD-10-CM | POA: Diagnosis not present

## 2019-09-20 DIAGNOSIS — Z951 Presence of aortocoronary bypass graft: Secondary | ICD-10-CM | POA: Diagnosis not present

## 2019-09-20 DIAGNOSIS — Z833 Family history of diabetes mellitus: Secondary | ICD-10-CM | POA: Diagnosis not present

## 2019-09-20 DIAGNOSIS — Z823 Family history of stroke: Secondary | ICD-10-CM | POA: Diagnosis not present

## 2019-09-20 DIAGNOSIS — E039 Hypothyroidism, unspecified: Secondary | ICD-10-CM | POA: Diagnosis not present

## 2019-09-20 DIAGNOSIS — L97929 Non-pressure chronic ulcer of unspecified part of left lower leg with unspecified severity: Secondary | ICD-10-CM | POA: Diagnosis not present

## 2019-09-20 DIAGNOSIS — Z8673 Personal history of transient ischemic attack (TIA), and cerebral infarction without residual deficits: Secondary | ICD-10-CM | POA: Diagnosis not present

## 2019-09-20 DIAGNOSIS — E669 Obesity, unspecified: Secondary | ICD-10-CM | POA: Diagnosis not present

## 2019-09-20 DIAGNOSIS — G2581 Restless legs syndrome: Secondary | ICD-10-CM | POA: Diagnosis not present

## 2019-09-20 DIAGNOSIS — Z20822 Contact with and (suspected) exposure to covid-19: Secondary | ICD-10-CM | POA: Diagnosis not present

## 2019-09-20 DIAGNOSIS — I70202 Unspecified atherosclerosis of native arteries of extremities, left leg: Secondary | ICD-10-CM | POA: Diagnosis not present

## 2019-09-20 DIAGNOSIS — I10 Essential (primary) hypertension: Secondary | ICD-10-CM | POA: Diagnosis not present

## 2019-09-20 DIAGNOSIS — F4323 Adjustment disorder with mixed anxiety and depressed mood: Secondary | ICD-10-CM | POA: Diagnosis not present

## 2019-09-20 DIAGNOSIS — I2510 Atherosclerotic heart disease of native coronary artery without angina pectoris: Secondary | ICD-10-CM | POA: Diagnosis not present

## 2019-09-20 DIAGNOSIS — E1142 Type 2 diabetes mellitus with diabetic polyneuropathy: Secondary | ICD-10-CM | POA: Diagnosis not present

## 2019-09-20 DIAGNOSIS — M86172 Other acute osteomyelitis, left ankle and foot: Secondary | ICD-10-CM | POA: Diagnosis not present

## 2019-09-20 DIAGNOSIS — D631 Anemia in chronic kidney disease: Secondary | ICD-10-CM | POA: Diagnosis not present

## 2019-09-20 DIAGNOSIS — Z01812 Encounter for preprocedural laboratory examination: Secondary | ICD-10-CM | POA: Insufficient documentation

## 2019-09-20 DIAGNOSIS — E1122 Type 2 diabetes mellitus with diabetic chronic kidney disease: Secondary | ICD-10-CM | POA: Diagnosis not present

## 2019-09-20 DIAGNOSIS — Z4781 Encounter for orthopedic aftercare following surgical amputation: Secondary | ICD-10-CM | POA: Diagnosis not present

## 2019-09-20 DIAGNOSIS — K219 Gastro-esophageal reflux disease without esophagitis: Secondary | ICD-10-CM | POA: Diagnosis not present

## 2019-09-20 DIAGNOSIS — I252 Old myocardial infarction: Secondary | ICD-10-CM | POA: Diagnosis not present

## 2019-09-20 DIAGNOSIS — Z89512 Acquired absence of left leg below knee: Secondary | ICD-10-CM | POA: Diagnosis not present

## 2019-09-20 DIAGNOSIS — N183 Chronic kidney disease, stage 3 unspecified: Secondary | ICD-10-CM | POA: Diagnosis not present

## 2019-09-20 DIAGNOSIS — Z23 Encounter for immunization: Secondary | ICD-10-CM | POA: Diagnosis not present

## 2019-09-20 DIAGNOSIS — E11649 Type 2 diabetes mellitus with hypoglycemia without coma: Secondary | ICD-10-CM | POA: Diagnosis not present

## 2019-09-20 DIAGNOSIS — E1169 Type 2 diabetes mellitus with other specified complication: Secondary | ICD-10-CM | POA: Diagnosis not present

## 2019-09-20 DIAGNOSIS — I130 Hypertensive heart and chronic kidney disease with heart failure and stage 1 through stage 4 chronic kidney disease, or unspecified chronic kidney disease: Secondary | ICD-10-CM | POA: Diagnosis not present

## 2019-09-20 DIAGNOSIS — E1151 Type 2 diabetes mellitus with diabetic peripheral angiopathy without gangrene: Secondary | ICD-10-CM | POA: Diagnosis not present

## 2019-09-20 DIAGNOSIS — K5901 Slow transit constipation: Secondary | ICD-10-CM | POA: Diagnosis not present

## 2019-09-20 LAB — SARS CORONAVIRUS 2 (TAT 6-24 HRS): SARS Coronavirus 2: NEGATIVE

## 2019-09-20 NOTE — Progress Notes (Signed)
Pt denies SOB, chest pain, and being under the care of a cardiologist. Pt stated that there are no changes in her history since last admission. Pt made aware to stop taking vitamins, fish oil and herbal medications. Do not take any NSAIDs ie: Ibuprofen, Advil, Naproxen (Aleve), Motrin, BC and Goody Powder. Pt made aware to take 12 units of Lantus insulin at HS. Pt made aware to check CBG every 2 hours prior to arrival to hospital on DOS. Pt made aware to take 12 units of Lantus insulin if CBG is > 70 on DOS. Pt made aware to treat a CBG < 70 with  4 ounces of apple or cranberry juice, wait 15 minutes after intervention to recheck BG, if BG remains < 70, call Short Stay unit to speak with a nurse. Pt verbalized understanding of all pre-op instructions.

## 2019-09-21 ENCOUNTER — Inpatient Hospital Stay (HOSPITAL_COMMUNITY): Payer: BC Managed Care – PPO | Admitting: Anesthesiology

## 2019-09-21 ENCOUNTER — Other Ambulatory Visit: Payer: Self-pay

## 2019-09-21 ENCOUNTER — Inpatient Hospital Stay (HOSPITAL_COMMUNITY)
Admission: RE | Admit: 2019-09-21 | Discharge: 2019-09-27 | DRG: 616 | Disposition: A | Discharge status: Rehabilitation Facility | Payer: BC Managed Care – PPO | Attending: Orthopedic Surgery | Admitting: Orthopedic Surgery

## 2019-09-21 ENCOUNTER — Encounter (HOSPITAL_COMMUNITY): Payer: Self-pay | Admitting: Orthopedic Surgery

## 2019-09-21 ENCOUNTER — Encounter (HOSPITAL_COMMUNITY)
Admission: RE | Disposition: A | Discharge status: Rehabilitation Facility | Payer: Self-pay | Source: Home / Self Care | Attending: Orthopedic Surgery

## 2019-09-21 DIAGNOSIS — Z89512 Acquired absence of left leg below knee: Secondary | ICD-10-CM | POA: Diagnosis not present

## 2019-09-21 DIAGNOSIS — M86172 Other acute osteomyelitis, left ankle and foot: Secondary | ICD-10-CM | POA: Diagnosis present

## 2019-09-21 DIAGNOSIS — G2581 Restless legs syndrome: Secondary | ICD-10-CM | POA: Diagnosis present

## 2019-09-21 DIAGNOSIS — E039 Hypothyroidism, unspecified: Secondary | ICD-10-CM | POA: Diagnosis present

## 2019-09-21 DIAGNOSIS — K219 Gastro-esophageal reflux disease without esophagitis: Secondary | ICD-10-CM | POA: Diagnosis present

## 2019-09-21 DIAGNOSIS — K5901 Slow transit constipation: Secondary | ICD-10-CM | POA: Diagnosis not present

## 2019-09-21 DIAGNOSIS — I5033 Acute on chronic diastolic (congestive) heart failure: Secondary | ICD-10-CM | POA: Diagnosis present

## 2019-09-21 DIAGNOSIS — Z87891 Personal history of nicotine dependence: Secondary | ICD-10-CM

## 2019-09-21 DIAGNOSIS — Z951 Presence of aortocoronary bypass graft: Secondary | ICD-10-CM | POA: Diagnosis not present

## 2019-09-21 DIAGNOSIS — E1152 Type 2 diabetes mellitus with diabetic peripheral angiopathy with gangrene: Secondary | ICD-10-CM | POA: Diagnosis present

## 2019-09-21 DIAGNOSIS — Z833 Family history of diabetes mellitus: Secondary | ICD-10-CM

## 2019-09-21 DIAGNOSIS — I252 Old myocardial infarction: Secondary | ICD-10-CM | POA: Diagnosis not present

## 2019-09-21 DIAGNOSIS — I2510 Atherosclerotic heart disease of native coronary artery without angina pectoris: Secondary | ICD-10-CM | POA: Diagnosis present

## 2019-09-21 DIAGNOSIS — Z23 Encounter for immunization: Secondary | ICD-10-CM | POA: Diagnosis not present

## 2019-09-21 DIAGNOSIS — I10 Essential (primary) hypertension: Secondary | ICD-10-CM | POA: Diagnosis not present

## 2019-09-21 DIAGNOSIS — Z8673 Personal history of transient ischemic attack (TIA), and cerebral infarction without residual deficits: Secondary | ICD-10-CM

## 2019-09-21 DIAGNOSIS — F4323 Adjustment disorder with mixed anxiety and depressed mood: Secondary | ICD-10-CM | POA: Diagnosis not present

## 2019-09-21 DIAGNOSIS — E785 Hyperlipidemia, unspecified: Secondary | ICD-10-CM | POA: Diagnosis present

## 2019-09-21 DIAGNOSIS — I130 Hypertensive heart and chronic kidney disease with heart failure and stage 1 through stage 4 chronic kidney disease, or unspecified chronic kidney disease: Secondary | ICD-10-CM | POA: Diagnosis present

## 2019-09-21 DIAGNOSIS — Z888 Allergy status to other drugs, medicaments and biological substances status: Secondary | ICD-10-CM

## 2019-09-21 DIAGNOSIS — E1142 Type 2 diabetes mellitus with diabetic polyneuropathy: Secondary | ICD-10-CM | POA: Diagnosis present

## 2019-09-21 DIAGNOSIS — Z89511 Acquired absence of right leg below knee: Secondary | ICD-10-CM | POA: Diagnosis not present

## 2019-09-21 DIAGNOSIS — D631 Anemia in chronic kidney disease: Secondary | ICD-10-CM | POA: Diagnosis present

## 2019-09-21 DIAGNOSIS — F419 Anxiety disorder, unspecified: Secondary | ICD-10-CM | POA: Diagnosis present

## 2019-09-21 DIAGNOSIS — N183 Chronic kidney disease, stage 3 unspecified: Secondary | ICD-10-CM | POA: Diagnosis present

## 2019-09-21 DIAGNOSIS — E1169 Type 2 diabetes mellitus with other specified complication: Principal | ICD-10-CM | POA: Diagnosis present

## 2019-09-21 DIAGNOSIS — Z20822 Contact with and (suspected) exposure to covid-19: Secondary | ICD-10-CM | POA: Diagnosis present

## 2019-09-21 DIAGNOSIS — M86272 Subacute osteomyelitis, left ankle and foot: Secondary | ICD-10-CM | POA: Diagnosis not present

## 2019-09-21 DIAGNOSIS — E11649 Type 2 diabetes mellitus with hypoglycemia without coma: Secondary | ICD-10-CM | POA: Diagnosis not present

## 2019-09-21 DIAGNOSIS — E1122 Type 2 diabetes mellitus with diabetic chronic kidney disease: Secondary | ICD-10-CM | POA: Diagnosis present

## 2019-09-21 DIAGNOSIS — E669 Obesity, unspecified: Secondary | ICD-10-CM | POA: Diagnosis not present

## 2019-09-21 DIAGNOSIS — Z880 Allergy status to penicillin: Secondary | ICD-10-CM

## 2019-09-21 DIAGNOSIS — Z823 Family history of stroke: Secondary | ICD-10-CM | POA: Diagnosis not present

## 2019-09-21 DIAGNOSIS — Z885 Allergy status to narcotic agent status: Secondary | ICD-10-CM

## 2019-09-21 HISTORY — PX: AMPUTATION: SHX166

## 2019-09-21 HISTORY — PX: BELOW KNEE LEG AMPUTATION: SUR23

## 2019-09-21 LAB — BASIC METABOLIC PANEL
Anion gap: 13 (ref 5–15)
BUN: 14 mg/dL (ref 6–20)
CO2: 23 mmol/L (ref 22–32)
Calcium: 8.9 mg/dL (ref 8.9–10.3)
Chloride: 103 mmol/L (ref 98–111)
Creatinine, Ser: 1 mg/dL (ref 0.44–1.00)
GFR calc Af Amer: >60 mL/min (ref >60)
GFR calc non Af Amer: >60 mL/min (ref >60)
Glucose, Bld: 123 mg/dL — ABNORMAL HIGH (ref 70–99)
Potassium: 5.5 mmol/L — ABNORMAL HIGH (ref 3.5–5.1)
Sodium: 139 mmol/L (ref 135–145)

## 2019-09-21 LAB — CBC
HCT: 37.1 % (ref 36.0–46.0)
Hemoglobin: 11.7 g/dL — ABNORMAL LOW (ref 12.0–15.0)
MCH: 27.5 pg (ref 26.0–34.0)
MCHC: 31.5 g/dL (ref 30.0–36.0)
MCV: 87.3 fL (ref 80.0–100.0)
Platelets: 755 10*3/uL — ABNORMAL HIGH (ref 150–400)
RBC: 4.25 MIL/uL (ref 3.87–5.11)
RDW: 13.1 % (ref 11.5–15.5)
WBC: 10.4 10*3/uL (ref 4.0–10.5)
nRBC: 0 % (ref 0.0–0.2)

## 2019-09-21 LAB — GLUCOSE, CAPILLARY
Glucose-Capillary: 128 mg/dL — ABNORMAL HIGH (ref 70–99)
Glucose-Capillary: 110 mg/dL — ABNORMAL HIGH (ref 70–99)
Glucose-Capillary: 118 mg/dL — ABNORMAL HIGH (ref 70–99)
Glucose-Capillary: 163 mg/dL — ABNORMAL HIGH (ref 70–99)
Glucose-Capillary: 166 mg/dL — ABNORMAL HIGH (ref 70–99)

## 2019-09-21 LAB — HEMOGLOBIN A1C
Hgb A1c MFr Bld: 14.1 % — ABNORMAL HIGH (ref 4.8–5.6)
Mean Plasma Glucose: 357.97 mg/dL

## 2019-09-21 SURGERY — AMPUTATION BELOW KNEE
Anesthesia: General | Site: Knee | Laterality: Left

## 2019-09-21 MED ORDER — METHOCARBAMOL 1000 MG/10ML IJ SOLN
500.0000 mg | Freq: Four times a day (QID) | INTRAVENOUS | Status: DC | PRN
  Filled 2019-09-21: qty 5

## 2019-09-21 MED ORDER — METOCLOPRAMIDE HCL 5 MG PO TABS: 5.0000 mg | ORAL_TABLET | Freq: Three times a day (TID) | ORAL | Status: DC | PRN

## 2019-09-21 MED ORDER — HYDROMORPHONE HCL 1 MG/ML IJ SOLN
INTRAMUSCULAR | Status: AC
  Filled 2019-09-21: qty 1

## 2019-09-21 MED ORDER — PROPOFOL 10 MG/ML IV BOLUS
INTRAVENOUS | Status: DC | PRN
  Administered 2019-09-21: 200 mg via INTRAVENOUS

## 2019-09-21 MED ORDER — HYDROMORPHONE HCL 1 MG/ML IJ SOLN
0.2500 mg | INTRAMUSCULAR | Status: DC | PRN
  Administered 2019-09-21: 0.25 mg via INTRAVENOUS

## 2019-09-21 MED ORDER — OXYCODONE HCL 5 MG PO TABS
5.0000 mg | ORAL_TABLET | ORAL | Status: DC | PRN
  Administered 2019-09-21: 5 mg via ORAL
  Administered 2019-09-21 – 2019-09-27 (×16): 10 mg via ORAL
  Filled 2019-09-21 (×17): qty 2

## 2019-09-21 MED ORDER — ONDANSETRON HCL 4 MG/2ML IJ SOLN
INTRAMUSCULAR | Status: DC | PRN
  Administered 2019-09-21: 4 mg via INTRAVENOUS

## 2019-09-21 MED ORDER — PROMETHAZINE HCL 25 MG/ML IJ SOLN: 6.2500 mg | INTRAMUSCULAR | Status: DC | PRN

## 2019-09-21 MED ORDER — ASPIRIN EC 81 MG PO TBEC
81.0000 mg | DELAYED_RELEASE_TABLET | Freq: Every day | ORAL | Status: DC
  Administered 2019-09-21 – 2019-09-27 (×7): 81 mg via ORAL
  Filled 2019-09-21 (×8): qty 1

## 2019-09-21 MED ORDER — FENTANYL CITRATE (PF) 100 MCG/2ML IJ SOLN
INTRAMUSCULAR | Status: AC
  Administered 2019-09-21: 100 ug via INTRAVENOUS
  Filled 2019-09-21: qty 2

## 2019-09-21 MED ORDER — HYDROMORPHONE HCL 1 MG/ML IJ SOLN
0.5000 mg | INTRAMUSCULAR | Status: DC | PRN
  Administered 2019-09-21 – 2019-09-24 (×4): 0.5 mg via INTRAVENOUS
  Filled 2019-09-21 (×4): qty 1

## 2019-09-21 MED ORDER — BUPIVACAINE LIPOSOME 1.3 % IJ SUSP
INTRAMUSCULAR | Status: DC | PRN
  Administered 2019-09-21: 10 mL via PERINEURAL

## 2019-09-21 MED ORDER — INSULIN GLARGINE 100 UNIT/ML ~~LOC~~ SOLN
30.0000 [IU] | Freq: Two times a day (BID) | SUBCUTANEOUS | Status: DC
  Administered 2019-09-21 – 2019-09-22 (×4): 30 [IU] via SUBCUTANEOUS
  Filled 2019-09-21 (×6): qty 0.3

## 2019-09-21 MED ORDER — CHLORHEXIDINE GLUCONATE 4 % EX LIQD: 60.0000 mL | Freq: Once | CUTANEOUS | Status: DC

## 2019-09-21 MED ORDER — METOCLOPRAMIDE HCL 5 MG/ML IJ SOLN: 5.0000 mg | Freq: Three times a day (TID) | INTRAMUSCULAR | Status: DC | PRN

## 2019-09-21 MED ORDER — CLINDAMYCIN PHOSPHATE 600 MG/50ML IV SOLN
600.0000 mg | Freq: Four times a day (QID) | INTRAVENOUS | Status: AC
  Administered 2019-09-21 – 2019-09-22 (×3): 600 mg via INTRAVENOUS
  Filled 2019-09-21 (×3): qty 50

## 2019-09-21 MED ORDER — BUPIVACAINE HCL (PF) 0.5 % IJ SOLN
INTRAMUSCULAR | Status: DC | PRN
  Administered 2019-09-21: 20 mL via PERINEURAL

## 2019-09-21 MED ORDER — LACTATED RINGERS IV SOLN: INTRAVENOUS | Status: DC

## 2019-09-21 MED ORDER — FENTANYL CITRATE (PF) 250 MCG/5ML IJ SOLN
INTRAMUSCULAR | Status: AC
  Filled 2019-09-21: qty 5

## 2019-09-21 MED ORDER — OXYCODONE HCL 5 MG PO TABS: 5.0000 mg | ORAL_TABLET | Freq: Once | ORAL | Status: DC | PRN

## 2019-09-21 MED ORDER — MIDAZOLAM HCL 2 MG/2ML IJ SOLN
INTRAMUSCULAR | Status: AC
  Administered 2019-09-21: 2 mg via INTRAVENOUS
  Filled 2019-09-21: qty 2

## 2019-09-21 MED ORDER — OXYCODONE HCL 5 MG/5ML PO SOLN: 5.0000 mg | Freq: Once | ORAL | Status: DC | PRN

## 2019-09-21 MED ORDER — INSULIN ASPART 100 UNIT/ML ~~LOC~~ SOLN
0.0000 [IU] | Freq: Three times a day (TID) | SUBCUTANEOUS | Status: DC
  Administered 2019-09-21: 3 [IU] via SUBCUTANEOUS
  Administered 2019-09-22: 2 [IU] via SUBCUTANEOUS
  Administered 2019-09-22: 5 [IU] via SUBCUTANEOUS
  Administered 2019-09-23: 2 [IU] via SUBCUTANEOUS
  Administered 2019-09-25: 8 [IU] via SUBCUTANEOUS
  Administered 2019-09-26: 3 [IU] via SUBCUTANEOUS
  Administered 2019-09-26: 2 [IU] via SUBCUTANEOUS
  Administered 2019-09-26: 3 [IU] via SUBCUTANEOUS
  Administered 2019-09-27: 2 [IU] via SUBCUTANEOUS
  Administered 2019-09-27: 3 [IU] via SUBCUTANEOUS

## 2019-09-21 MED ORDER — DOCUSATE SODIUM 100 MG PO CAPS
100.0000 mg | ORAL_CAPSULE | Freq: Two times a day (BID) | ORAL | Status: DC
  Administered 2019-09-21 – 2019-09-27 (×13): 100 mg via ORAL
  Filled 2019-09-21 (×13): qty 1

## 2019-09-21 MED ORDER — ONDANSETRON HCL 4 MG/2ML IJ SOLN: 4.0000 mg | Freq: Four times a day (QID) | INTRAMUSCULAR | Status: DC | PRN

## 2019-09-21 MED ORDER — 0.9 % SODIUM CHLORIDE (POUR BTL) OPTIME
TOPICAL | Status: DC | PRN
  Administered 2019-09-21: 1000 mL

## 2019-09-21 MED ORDER — PROPOFOL 10 MG/ML IV BOLUS
INTRAVENOUS | Status: AC
  Filled 2019-09-21: qty 20

## 2019-09-21 MED ORDER — MIDAZOLAM HCL 2 MG/2ML IJ SOLN
INTRAMUSCULAR | Status: AC
  Filled 2019-09-21: qty 2

## 2019-09-21 MED ORDER — LIDOCAINE 2% (20 MG/ML) 5 ML SYRINGE
INTRAMUSCULAR | Status: DC | PRN
  Administered 2019-09-21: 40 mg via INTRAVENOUS

## 2019-09-21 MED ORDER — INFLUENZA VAC SPLIT QUAD 0.5 ML IM SUSY
0.5000 mL | PREFILLED_SYRINGE | INTRAMUSCULAR | Status: AC
  Administered 2019-09-22: 0.5 mL via INTRAMUSCULAR
  Filled 2019-09-21: qty 0.5

## 2019-09-21 MED ORDER — SODIUM CHLORIDE 0.9 % IV SOLN: INTRAVENOUS | Status: DC

## 2019-09-21 MED ORDER — AMLODIPINE BESYLATE 5 MG PO TABS
5.0000 mg | ORAL_TABLET | Freq: Every day | ORAL | Status: DC
  Administered 2019-09-21 – 2019-09-27 (×7): 5 mg via ORAL
  Filled 2019-09-21 (×7): qty 1

## 2019-09-21 MED ORDER — ONDANSETRON HCL 4 MG PO TABS: 4.0000 mg | ORAL_TABLET | Freq: Four times a day (QID) | ORAL | Status: DC | PRN

## 2019-09-21 MED ORDER — FENTANYL CITRATE (PF) 100 MCG/2ML IJ SOLN: 100.0000 ug | Freq: Once | INTRAMUSCULAR | Status: AC

## 2019-09-21 MED ORDER — ACETAMINOPHEN 325 MG PO TABS
325.0000 mg | ORAL_TABLET | Freq: Four times a day (QID) | ORAL | Status: DC | PRN
  Administered 2019-09-22: 325 mg via ORAL
  Administered 2019-09-25: 650 mg via ORAL
  Filled 2019-09-21 (×2): qty 2

## 2019-09-21 MED ORDER — CLINDAMYCIN PHOSPHATE 900 MG/50ML IV SOLN
900.0000 mg | INTRAVENOUS | Status: AC
  Administered 2019-09-21: 900 mg via INTRAVENOUS
  Filled 2019-09-21: qty 50

## 2019-09-21 MED ORDER — METHOCARBAMOL 500 MG PO TABS
500.0000 mg | ORAL_TABLET | Freq: Four times a day (QID) | ORAL | Status: DC | PRN
  Administered 2019-09-21 – 2019-09-27 (×8): 500 mg via ORAL
  Filled 2019-09-21 (×8): qty 1

## 2019-09-21 MED ORDER — DEXAMETHASONE SODIUM PHOSPHATE 10 MG/ML IJ SOLN
INTRAMUSCULAR | Status: DC | PRN
  Administered 2019-09-21: 5 mg via INTRAVENOUS

## 2019-09-21 MED ORDER — PHENYLEPHRINE 40 MCG/ML (10ML) SYRINGE FOR IV PUSH (FOR BLOOD PRESSURE SUPPORT)
PREFILLED_SYRINGE | INTRAVENOUS | Status: DC | PRN
  Administered 2019-09-21: 40 ug via INTRAVENOUS

## 2019-09-21 MED ORDER — MIDAZOLAM HCL 2 MG/2ML IJ SOLN: 2.0000 mg | Freq: Once | INTRAMUSCULAR | Status: AC

## 2019-09-21 SURGICAL SUPPLY — 37 items
BLADE SAW RECIP 87.9 MT (BLADE) ×2 IMPLANT
BLADE SURG 21 STRL SS (BLADE) ×2 IMPLANT
BNDG COHESIVE 6X5 TAN STRL LF (GAUZE/BANDAGES/DRESSINGS) ×2 IMPLANT
CANISTER WOUND CARE 500ML ATS (WOUND CARE) ×2 IMPLANT
COVER SURGICAL LIGHT HANDLE (MISCELLANEOUS) ×2 IMPLANT
COVER WAND RF STERILE (DRAPES) IMPLANT
CUFF TOURN SGL QUICK 34 (TOURNIQUET CUFF) ×1
CUFF TRNQT CYL 34X4.125X (TOURNIQUET CUFF) ×1 IMPLANT
DRAPE INCISE IOBAN 66X45 STRL (DRAPES) ×2 IMPLANT
DRAPE U-SHAPE 47X51 STRL (DRAPES) ×2 IMPLANT
DRESSING PREVENA PLUS CUSTOM (GAUZE/BANDAGES/DRESSINGS) ×1 IMPLANT
DRSG PREVENA PLUS CUSTOM (GAUZE/BANDAGES/DRESSINGS) ×2
DURAPREP 26ML APPLICATOR (WOUND CARE) ×2 IMPLANT
ELECT REM PT RETURN 9FT ADLT (ELECTROSURGICAL) ×2
ELECTRODE REM PT RTRN 9FT ADLT (ELECTROSURGICAL) ×1 IMPLANT
GLOVE BIOGEL PI IND STRL 9 (GLOVE) ×1 IMPLANT
GLOVE BIOGEL PI INDICATOR 9 (GLOVE) ×1
GLOVE SURG ORTHO 9.0 STRL STRW (GLOVE) ×2 IMPLANT
GOWN STRL REUS W/ TWL XL LVL3 (GOWN DISPOSABLE) ×2 IMPLANT
GOWN STRL REUS W/TWL XL LVL3 (GOWN DISPOSABLE) ×2
KIT BASIN OR (CUSTOM PROCEDURE TRAY) ×2 IMPLANT
KIT TURNOVER KIT B (KITS) ×2 IMPLANT
MANIFOLD NEPTUNE II (INSTRUMENTS) ×2 IMPLANT
NS IRRIG 1000ML POUR BTL (IV SOLUTION) ×2 IMPLANT
PACK ORTHO EXTREMITY (CUSTOM PROCEDURE TRAY) ×2 IMPLANT
PAD ARMBOARD 7.5X6 YLW CONV (MISCELLANEOUS) ×2 IMPLANT
PREVENA RESTOR ARTHOFORM 46X30 (CANNISTER) ×2 IMPLANT
SPONGE LAP 18X18 RF (DISPOSABLE) IMPLANT
STAPLER VISISTAT 35W (STAPLE) IMPLANT
STOCKINETTE IMPERVIOUS LG (DRAPES) ×2 IMPLANT
SUT ETHILON 2 0 PSLX (SUTURE) IMPLANT
SUT SILK 2 0 (SUTURE) ×1
SUT SILK 2-0 18XBRD TIE 12 (SUTURE) ×1 IMPLANT
SUT VIC AB 1 CTX 27 (SUTURE) ×4 IMPLANT
TOWEL GREEN STERILE (TOWEL DISPOSABLE) ×2 IMPLANT
TUBE CONNECTING 12X1/4 (SUCTIONS) ×2 IMPLANT
YANKAUER SUCT BULB TIP NO VENT (SUCTIONS) ×2 IMPLANT

## 2019-09-21 NOTE — Anesthesia Preprocedure Evaluation (Signed)
Anesthesia Evaluation  Patient identified by MRN, date of birth, ID band Patient awake    Reviewed: Allergy & Precautions, NPO status , Patient's Chart, lab work & pertinent test results  Airway Mallampati: II  TM Distance: >3 FB Neck ROM: Full    Dental no notable dental hx.    Pulmonary neg pulmonary ROS, former smoker,    Pulmonary exam normal breath sounds clear to auscultation       Cardiovascular hypertension, + angina + CAD, + Past MI, + Peripheral Vascular Disease and +CHF  Normal cardiovascular exam+ Valvular Problems/Murmurs  Rhythm:Regular Rate:Normal     Neuro/Psych  Headaches, Anxiety Depression  Neuromuscular disease CVA    GI/Hepatic Neg liver ROS, GERD  ,  Endo/Other  diabetesHypothyroidism   Renal/GU      Musculoskeletal  (+) Arthritis , Osteoarthritis,    Abdominal   Peds  Hematology  (+) anemia ,   Anesthesia Other Findings   Reproductive/Obstetrics                             Anesthesia Physical  Anesthesia Plan  ASA: III  Anesthesia Plan: General   Post-op Pain Management:  Regional for Post-op pain   Induction: Intravenous  PONV Risk Score and Plan: 3 and Ondansetron, Midazolam, Dexamethasone and Treatment may vary due to age or medical condition  Airway Management Planned: LMA  Additional Equipment:   Intra-op Plan:   Post-operative Plan: Extubation in OR  Informed Consent: I have reviewed the patients History and Physical, chart, labs and discussed the procedure including the risks, benefits and alternatives for the proposed anesthesia with the patient or authorized representative who has indicated his/her understanding and acceptance.     Dental advisory given  Plan Discussed with: CRNA and Anesthesiologist  Anesthesia Plan Comments: (NSTEMI 06/23/18. Ultimately underwent CABG x4 on 06/28/2018. She has not seen cardiology since discharge. She did  see Dr. Roxan Hockey 09/17/18 and per his note she was doing much better overall at that time, was arranging for cardiology f/u but evidently never happened.        )        Anesthesia Quick Evaluation

## 2019-09-21 NOTE — Op Note (Signed)
   Date of Surgery: 09/21/2019  INDICATIONS: Ms. Trunzo is a 54 y.o.-year-old female who presents with osteomyelitis gangrenous ulceration left hindfoot she is status post a right transtibial amputation.Marland Kitchen  PREOPERATIVE DIAGNOSIS: Gangrene osteomyelitis left hindfoot  POSTOPERATIVE DIAGNOSIS: Same.  PROCEDURE: Transtibial amputation Application of Prevena wound VAC  SURGEON: [name] Given, M.D.  ANESTHESIA:  general  IV FLUIDS AND URINE: See anesthesia.  ESTIMATED BLOOD LOSS: Minimal mL.  COMPLICATIONS: None.  DESCRIPTION OF PROCEDURE: The patient was brought to the operating room and underwent a general anesthetic. After adequate levels of anesthesia were obtained patient's lower extremity was prepped using DuraPrep draped into a sterile field. A timeout was called. The foot was draped out of the sterile field with impervious stockinette. A transverse incision was made 11 cm distal to the tibial tubercle. This curved proximally and a large posterior flap was created. The tibia was transected 1 cm proximal to the skin incision. The fibula was transected just proximal to the tibial incision. The tibia was beveled anteriorly. A large posterior flap was created. The sciatic nerve was pulled cut and allowed to retract. The vascular bundles were suture ligated with 2-0 silk. The deep and superficial fascial layers were closed using #1 Vicryl. The skin was closed using staples and 2-0 nylon. The wound was covered with a Prevena wound VAC. There was a good suction fit. A prosthetic shrinker was applied. Patient was extubated taken to the PACU in stable condition.   DISCHARGE PLANNING:  Antibiotic duration: 24 hours  Weightbearing: Nonweightbearing on the left  Pain medication: Opioid pathway  Dressing care/ Wound VAC: Continue wound VAC 1 week after discharge  Discharge to: Patient would like to try discharge to home she does have family support.  Prosthetics: Museum/gallery curator.  Follow-up: In the  office 1 week post operative.  Meridee Score, MD [place] 1:55 PM

## 2019-09-21 NOTE — Transfer of Care (Signed)
Immediate Anesthesia Transfer of Care Note  Patient: KEESHA CATTANEO  Procedure(s) Performed: LEFT BELOW KNEE AMPUTATION (Left Knee)  Patient Location: PACU  Anesthesia Type:GA combined with regional for post-op pain  Level of Consciousness: drowsy  Airway & Oxygen Therapy: Patient Spontanous Breathing and Patient connected to nasal cannula oxygen  Post-op Assessment: Report given to RN, Post -op Vital signs reviewed and stable and Patient moving all extremities  Post vital signs: Reviewed and stable  Last Vitals:  Vitals Value Taken Time  BP 159/94 09/21/19 1358  Temp    Pulse 77 09/21/19 1400  Resp 23 09/21/19 1400  SpO2 100 % 09/21/19 1400  Vitals shown include unvalidated device data.  Last Pain:  Vitals:   09/21/19 1116  TempSrc:   PainSc: 0-No pain      Patients Stated Pain Goal: 3 ([id number])  Complications: No apparent anesthesia complications

## 2019-09-21 NOTE — H&P (Signed)
[name] is an 54 y.o. female.   Chief Complaint: Left Foot osteomyelitis HPI: This is a pleasant 54 year old woman who is status post right below-knee amputation.  She now has osteomyelitis in her left heel.  She has been scheduled for a left below-knee amputation most recently about a week ago.  She did have some blood pressure issues which she has discussed with her primary care doctor.  She would like to go forward now with her amputation as soon as possible   Past Medical History:  Diagnosis Date  . Abscess of great toe, right   . Acute on chronic diastolic CHF (congestive heart failure) ([place]) 06/24/2018  . Acute osteomyelitis of toe, left ([place]) 09/05/2016  . Amputation of right great toe ([place]) 12/22/2018  . Anxiety   . Cataract    left - surgery to remove  . Cellulitis of foot, right 12/11/2018  . COMMON MIGRAINE 10/07/2010  . Decreased visual acuity 11/10/2016  . Depression 12/18/2012  . Diabetes mellitus type II, uncontrolled ([place]) 10/07/2010   Qualifier: Diagnosis of  By: Charlett Blake MD, [name]    . Diabetic foot infection ([place]) 08/26/2016  . Diabetic infection of right foot ([place])   . Disturbance of skin sensation 10/07/2010  . Gangrene of right foot ([place])   . GERD (gastroesophageal reflux disease)   . Heart murmur   . History of kidney stones    "years ago" - passed stones  . Hyperlipidemia 12/06/2010  . Hypertension   . Hypothyroidism   . Lipoma of abdominal wall 10/05/2016  . Overweight(278.02) 12/06/2010  . PERIPHERAL NEUROPATHY, FEET 10/07/2010  . PVD (peripheral vascular disease) ([place]) 01/21/2012  . RESTLESS LEG SYNDROME 10/25/2010  . Stroke [facility]) 2014, 2017   most recently in 2/17 - intracerebral hemorrhage    Past Surgical History:  Procedure Laterality Date  . AMPUTATION Right 12/13/2018   Procedure: AMPUTATION RIGHT GREAT TOE, LOCAL RELOCATION OF TISSUE FOR WOUND CLOSURE 9cm x 3cm, VAC APPLICATION;  Surgeon: [name], MD;  Location: [place];  Service: Orthopedics;   Laterality: Right;  . AMPUTATION Right 12/31/2018   Procedure: RIGHT BELOW KNEE AMPUTATION;  Surgeon: [name], MD;  Location: [place];  Service: Orthopedics;  Laterality: Right;  . AMPUTATION TOE Left 09/09/2016   Procedure: AMPUTATION OF LEFT GREAT TOE;  Surgeon: Milly Jakob, MD;  Location: [place];  Service: Orthopedics;  Laterality: Left;  . CARDIAC CATHETERIZATION  06/24/2018  . CORONARY ARTERY BYPASS GRAFT N/A 06/28/2018   Procedure: CORONARY ARTERY BYPASS GRAFTING (CABG) times  four, using left internal mammary artery, endoscopically harvested right saphenous vein, and harvested left radial artery;  Surgeon: [name], MD;  Location: [place];  Service: Open Heart Surgery;  Laterality: N/A;  . ENDOVEIN HARVEST OF GREATER SAPHENOUS VEIN Right 06/28/2018   Procedure: ENDOVEIN HARVEST OF GREATER SAPHENOUS VEIN;  Surgeon: [name], MD;  Location: [place];  Service: Open Heart Surgery;  Laterality: Right;  . EYE SURGERY Left 06/2017   [facility]  . LEFT HEART CATH AND CORONARY ANGIOGRAPHY N/A 06/24/2018   Procedure: LEFT HEART CATH AND CORONARY ANGIOGRAPHY;  Surgeon: Troy Sine, MD;  Location: [place] [facility];  Service: Cardiovascular;  Laterality: N/A;  . RADIAL ARTERY HARVEST Left 06/28/2018   Procedure: RADIAL ARTERY HARVEST;  Surgeon: [name], MD;  Location: [place];  Service: Open Heart Surgery;  Laterality: Left;  . TEE WITHOUT CARDIOVERSION N/A 06/28/2018   Procedure: TRANSESOPHAGEAL ECHOCARDIOGRAM (TEE);  Surgeon: [name], MD;  Location: [place];  Service: Open Heart Surgery;  Laterality: N/A;  . WISDOM TOOTH EXTRACTION      Family History  Problem Relation Age of Onset  . Arthritis Mother   . Stroke Brother        previous smoker  . Alcohol abuse Brother        in remission  . Leukemia Brother   . Diabetes Paternal Grandmother   . Healthy Son    Social History:  reports that she quit smoking about 14 years  ago. She smoked 0.50 packs per day. She has never used smokeless tobacco. She reports previous drug use. She reports that she does not drink alcohol.  Allergies:  Allergies  Allergen Reactions  . Penicillins Other (See Comments)    made pt feel sick  Did it involve swelling of the face/tongue/throat, SOB, or low BP? No Did it involve sudden or severe rash/hives, skin peeling, or any reaction on the inside of your mouth or nose? No Did you need to seek medical attention at a hospital or doctor's office? No When did it last happen?April 2020 If all above answers are "NO", may proceed with cephalosporin use.   Recardo Evangelist [Pregabalin] Other (See Comments)    MADE PATIENT VERY EMOTIONAL AND WOULD CRY EASILY  . Morphine And Related Nausea And Vomiting  . Tramadol Nausea And Vomiting    Pt cant tolerate this pain med.     No medications prior to admission.    Results for orders placed or performed during the hospital encounter of 09/20/19 (from the past 48 hour(s))  SARS CORONAVIRUS 2 (TAT 6-24 HRS) Nasopharyngeal Nasopharyngeal Swab     Status: None   Collection Time: 09/20/19  2:40 PM   Specimen: Nasopharyngeal Swab  Result Value Ref Range   SARS Coronavirus 2 NEGATIVE NEGATIVE    Comment: (NOTE) SARS-CoV-2 target nucleic acids are NOT DETECTED. The SARS-CoV-2 RNA is generally detectable in upper and lower respiratory specimens during the acute phase of infection. Negative results do not preclude SARS-CoV-2 infection, do not rule out co-infections with other pathogens, and should not be used as the sole basis for treatment or other patient management decisions. Negative results must be combined with clinical observations, patient history, and epidemiological information. The expected result is Negative. Fact Sheet for Patients: SugarRoll.be Fact Sheet for Healthcare Providers: https://www.woods-mathews.com/ This test is not yet  approved or cleared by the [place] FDA and  has been authorized for detection and/or diagnosis of SARS-CoV-2 by FDA under an Emergency Use Authorization (EUA). This EUA will remain  in effect (meaning this test can be used) for the duration of the COVID-19 declaration under Section 56 4(b)(1) of the Act, 21 U.S.C. section 360bbb-3(b)(1), unless the authorization is terminated or revoked sooner. Performed at [facility], [place] [address] [place], [place] [address]    No results found.  Review of Systems  All other systems reviewed and are negative.   Last menstrual period 02/12/2011. Physical Exam  Patient is alert, oriented, no adenopathy, well-dressed, normal affect, normal respiratory effort. Focused exam of her left foot demonstrates necrotic left heel ulcer.  No surrounding significant cellulitis swelling is well controlled mild to moderate drainage  Assessment/Plan Plan: We will plan for left below-knee amputation next week.  I reviewed the risks with her which include but not limited to bleeding infection revision surgery we also reviewed the surgical recovery outcomes and expectations  Bevely Palmer Olina Melfi,  PA 09/21/2019, 6:59 AM

## 2019-09-21 NOTE — Plan of Care (Signed)

## 2019-09-21 NOTE — Progress Notes (Signed)
Orthopedic Tech Progress Note Patient Details:  [name] 01-12-1966 [id number] Called in order to [facility] for a VIVE PROTOCOL BK for patient [name], female   DOB: May 06, 1966, 54 y.o.   MRN: [id number]   Janit Pagan 09/21/2019, 2:56 PM

## 2019-09-21 NOTE — Anesthesia Procedure Notes (Signed)
Anesthesia Regional Block: Popliteal block   Pre-Anesthetic Checklist: ,, timeout performed, Correct Patient, Correct Site, Correct Laterality, Correct Procedure, Correct Position, site marked, Risks and benefits discussed,  Surgical consent,  Pre-op evaluation,  At surgeon's request and post-op pain management  Laterality: Left  Prep: chloraprep       Needles:  Injection technique: Single-shot  Needle Type: Stimiplex     Needle Length: 9cm  Needle Gauge: 21     Additional Needles:   Procedures:,,,, ultrasound used (permanent image in chart),,,,  Narrative:  Start time: 09/21/2019 12:25 PM End time: 09/21/2019 12:30 PM Injection made incrementally with aspirations every 5 mL.  Performed by: Personally  Anesthesiologist: Lynda Rainwater, MD

## 2019-09-21 NOTE — Progress Notes (Signed)
Notified Dr. Sabra Heck of patients blood pressure, (210/109, 207/93, 202/96), no orders received will continue to monitor patient

## 2019-09-21 NOTE — Anesthesia Procedure Notes (Signed)
Procedure Name: LMA Insertion Date/Time: 09/21/2019 1:22 PM Performed by: Lynel Forester T, CRNA Pre-anesthesia Checklist: Patient identified, Emergency Drugs available, Suction available and Patient being monitored Patient Re-evaluated:Patient Re-evaluated prior to induction Oxygen Delivery Method: Circle system utilized Preoxygenation: Pre-oxygenation with 100% oxygen Induction Type: IV induction LMA: LMA inserted LMA Size: 5.0 Number of attempts: 1 Placement Confirmation: positive ETCO2 and breath sounds checked- equal and bilateral Tube secured with: Tape

## 2019-09-21 NOTE — Plan of Care (Signed)
  Problem: Education: Goal: Knowledge of General Education information will improve Description: Including pain rating scale, medication(s)/side effects and non-pharmacologic comfort measures Outcome: Progressing   Problem: Education: Goal: Knowledge of General Education information will improve Description: Including pain rating scale, medication(s)/side effects and non-pharmacologic comfort measures Outcome: Progressing   Problem: Health Behavior/Discharge Planning: Goal: Ability to manage health-related needs will improve Outcome: Progressing   Problem: Clinical Measurements: Goal: Will remain free from infection Outcome: Progressing   Problem: Activity: Goal: Risk for activity intolerance will decrease Outcome: Progressing   Problem: Coping: Goal: Level of anxiety will decrease Outcome: Progressing   Problem: Pain Managment: Goal: General experience of comfort will improve Outcome: Progressing   Problem: Safety: Goal: Ability to remain free from injury will improve Outcome: Progressing   Problem: Skin Integrity: Goal: Risk for impaired skin integrity will decrease Outcome: Progressing

## 2019-09-22 ENCOUNTER — Encounter: Payer: Self-pay | Admitting: *Deleted

## 2019-09-22 LAB — GLUCOSE, CAPILLARY
Glucose-Capillary: 144 mg/dL — ABNORMAL HIGH (ref 70–99)
Glucose-Capillary: 60 mg/dL — ABNORMAL LOW (ref 70–99)
Glucose-Capillary: 114 mg/dL — ABNORMAL HIGH (ref 70–99)
Glucose-Capillary: 244 mg/dL — ABNORMAL HIGH (ref 70–99)
Glucose-Capillary: 94 mg/dL (ref 70–99)

## 2019-09-22 NOTE — TOC Initial Note (Addendum)
Transition of Care [facility]) - Initial/Assessment Note    Patient Details  Name: [name] MRN: [id number] Date of Birth: [birth year]  [facility] CM/SW Contact:    Sharin Mons, RN Phone Number: 09/22/2019, 11:34 AM  Clinical Narrative:   Pt  s/p L transtibial amputation on 09/21/2019, PHMx: R BKA 12/31/18, CHF, anxiety, migraine, depression, DM, PVD, stroke.             NCM consult  received for potential  TOC needs. PT/OT evaluations pending. Inpateint rehab consult pending.  NCM will continue to monitor for TOC needs....  09/22/2010 NCM spoke with pt @ bedside about TOC needs,noting PT/OT recommendations for CIR. Pt states would like to go home with family with [facility] services however is unsure if that is what's  best for her. States will see how she progresses with therapy and getting up out of bed.   Pt with no preference with [facility] agency if d/c to home. Referral [facility] with [facility].  CIR following....  09/23/2019 1535   [facility] accepted pt for [facility] services if needed @ d/c. If d/c to CIR, [facility] will continue to follow post CIR if needed.   Expected Discharge Plan: IP Rehab Facility Barriers to Discharge: Continued Medical Work up   Patient Goals and CMS Choice        Expected Discharge Plan and Services Expected Discharge Plan: [place]                                              Prior Living Arrangements/Services                       Activities of Daily Living Home Assistive Devices/Equipment: Wheelchair, Environmental consultant (specify type), Dentures (specify type), Eyeglasses, CBG Meter, Prosthesis ADL Screening (condition at time of admission) Patient's cognitive ability adequate to safely complete daily activities?: Yes Is the patient deaf or have difficulty hearing?: No Does the patient have difficulty seeing, even when wearing glasses/contacts?: No Does the patient have difficulty concentrating, remembering, or making decisions?:  No Patient able to express need for assistance with ADLs?: Yes Does the patient have difficulty dressing or bathing?: No Independently performs ADLs?: Yes (appropriate for developmental age) Does the patient have difficulty walking or climbing stairs?: Yes Weakness of Legs: Both Weakness of Arms/Hands: None  Permission Sought/Granted                  Emotional Assessment              Admission diagnosis:  Acute osteomyelitis of left foot [facility]) [id number] Patient Active Problem List   Diagnosis Date Noted  . Acute osteomyelitis of left foot ([place]) 09/21/2019  . Subacute osteomyelitis, left ankle and foot ([place])   . Pressure injury of left heel, unstageable ([place]) 02/09/2019  . Hypoglycemia   . Diabetes mellitus type 2 in nonobese ([facility])   . Labile blood pressure   . Labile blood glucose   . Phantom limb pain ([place])   . CKD (chronic kidney disease), stage III   . Hypothyroidism   . Essential hypertension   . Chronic diastolic congestive heart failure ([place])   . Coronary artery disease involving coronary bypass graft of native heart without angina pectoris   . Poorly controlled type 2 diabetes mellitus with peripheral neuropathy ([place])   . Acute  on chronic anemia   . History of below knee amputation, right ([place]) 12/31/2018  . S/P CABG x 3 06/28/2018  . Coronary artery disease involving native coronary artery of native heart with angina pectoris ([place]) 06/25/2018  . Type 2 diabetes mellitus with vascular disease ([place]) 06/24/2018  . Acute on chronic diastolic CHF (congestive heart failure) ([place]) 06/24/2018  . Non-ST elevation (NSTEMI) myocardial infarction ([place]) 06/24/2018  . Chest pain 06/23/2018  . Glaucoma 09/17/2017  . Diabetic retinopathy ([place]) 09/17/2017  . Otitis media 11/10/2016  . Decreased visual acuity 11/10/2016  . Spinal stenosis, lumbar region with neurogenic claudication 10/22/2016  . Other spondylosis with radiculopathy, lumbar region 10/13/2016  . Lipoma of  abdominal wall 10/05/2016  . Severe protein-calorie malnutrition ([place]) 09/04/2016  . Anemia of chronic disease 09/04/2016  . Diabetic polyneuropathy associated with type 2 diabetes mellitus ([place]) 05/07/2016  . Orthostatic hypotension 05/07/2016  . Cerebrovascular disease 04/27/2016  . N&V (nausea and vomiting) 04/01/2016  . ICH (intracerebral hemorrhage) ([place]) 03/30/2016  . Visit for preventive health examination 05/21/2015  . Breast cancer screening 05/21/2015  . Arm lesion 07/02/2014  . Pain in limb 01/13/2014  . Neuropathic pain of both legs 01/13/2014  . Hip pain 01/12/2014  . Allergic rhinitis 03/10/2013  . Back pain 03/08/2013  . Depression 12/18/2012  . PVD (peripheral vascular disease) ([place]) 01/21/2012  . Hyperlipidemia 12/06/2010  . Overweight(278.02) 12/06/2010  . RESTLESS LEG SYNDROME 10/25/2010  . Migraine without aura 10/07/2010  . Hereditary and idiopathic peripheral neuropathy 10/07/2010  . Essential hypertension, benign 10/07/2010  . DISTURBANCE OF SKIN SENSATION 10/07/2010  . HEART MURMUR, HX OF 10/07/2010  . NEPHROLITHIASIS, HX OF 10/07/2010   PCP:  Mosie Lukes, MD Pharmacy:   [id number] - [place], [place] - [name] [place] [address] Phone: [phone] Fax: [phone]     Social Determinants of Health (SDOH) Interventions    Readmission Risk Interventions Readmission Risk Prevention Plan 12/14/2018 07/06/2018  Transportation Screening Complete Complete  PCP or Specialist Appt within 5-7 Days - Complete  PCP or Specialist Appt within 3-5 Days Complete -  Home Care Screening - Complete  Medication Review (RN CM) - Complete  HRI or Home Care Consult Complete -  Social Work Consult for [place] Planning/Counseling Complete -  Palliative Care Screening Not Applicable -  Some recent data might be hidden

## 2019-09-22 NOTE — Progress Notes (Signed)
Inpatient Rehab Admissions:  Inpatient Rehab Consult received.  I met with pt at the bedside for rehabilitation assessment. Pt is familiar with CIR program as she was with [name] for her last BKA. While pt understands she would benefit from intensive rehab for transfer training and ADL completion, she prefers to go straight home and get Home Health. We began trouble shooting through areas that may be difficult for her to do if she went straight home, but pt states she has great support from her son and daughter-in-law and strongly prefers home health at this time. She did mention that if she is not able to get Home Health therapy set up she will consent to CIR. I have contacted TOC team to notify them of pt's preference for home health. They are aware to contact me if she is unable to get [facility] services so we can pursue CIR.   Will continue to follow.   Raechel Ache, OTR/L  Rehab Admissions Coordinator  [phone] 09/22/2019 3:33 PM

## 2019-09-22 NOTE — Evaluation (Addendum)
Occupational Therapy Evaluation Patient Details Name: [name] MRN: [id number] DOB: 02/16/1966 Today's Date: 09/22/2019    History of Present Illness Pt is a 54 yo female s/p L BKA 09/21/2019, PHMx: R BKA 12/31/18, CHF, anxiety, migraine, depression, DM, PVD, stroke   Clinical Impression   This 54 yo female admitted and underwent above presents to acute OT with increased pain, decreased balance, and generalized weakness putting her at a min guard A-Mod A level for basic ADLs and affecting her PLOF of being Mod I from mainly a W/C level. She reports she has gotten her RLE prothesis but they have not wanted her to weight bear on LLE except for transfers only so she has only worn it not attempted transfers with it. Inpatient rehab with give her the opportunity to now learn to use her RLE prothesis and to get back to her Mod I level. Pt did report mild dizziness upon sitting--we encouraged her to sit up on EOB for all meals to help not only with this but also with just general activity.     Follow Up Recommendations  CIR;Supervision/Assistance - 24 hour    Equipment Recommendations  Other (comment)(drop arm 3n1)    Recommendations for Other Services Rehab consult     Precautions / Restrictions Precautions Precautions: Fall Precaution Comments: wound vac Required Braces or Orthoses: (residual limb protector for LLE) Restrictions Weight Bearing Restrictions: Yes LLE Weight Bearing: Non weight bearing      Mobility Bed Mobility Overal bed mobility: Needs Assistance Bed Mobility: Supine to Sit;Sit to Supine     Supine to sit: Min guard;HOB elevated Sit to supine: Min guard   General bed mobility comments: Pt used rails for supine to sit EOB  Transfers                 General transfer comment: Patient reports she feels to weak to try an anterior/posterior transfer today, we also discussed lateral transfers (will need a drop arm recliner or W/C for this)    Balance  Overall balance assessment: Needs assistance Sitting-balance support: No upper extremity supported;Feet unsupported(Bil BKA) Sitting balance-Leahy Scale: Fair         Standing balance comment: Pt does not have prothesis her in hosptial with her                           ADL either performed or assessed with clinical judgement   ADL Overall ADL's : Needs assistance/impaired Eating/Feeding: Independent;Sitting Eating/Feeding Details (indicate cue type and reason): EOB Grooming: Sitting;Min guard Grooming Details (indicate cue type and reason): EOB Upper Body Bathing: Min guard Upper Body Bathing Details (indicate cue type and reason): sitting EOB Lower Body Bathing: Moderate assistance;Bed level   Upper Body Dressing : Sitting;Min guard Upper Body Dressing Details (indicate cue type and reason): EOB Lower Body Dressing: Moderate assistance;Bed level     Toilet Transfer Details (indicate cue type and reason): Demonstrated to patient anterior (forward) and posterior (backward) to 3n1 from couch in pt's room (pt feels too weak to try this today) Toileting- Clothing Manipulation and Hygiene: Moderate assistance;Sitting/lateral lean               Vision Patient Visual Report: No change from baseline              Pertinent Vitals/Pain Pain Assessment: 0-10 Pain Score: 6  Pain Location: LLE Pain Descriptors / Indicators: Aching;Throbbing;Sore Pain Intervention(s): Limited activity within patient's tolerance;Monitored during session;Repositioned;Premedicated  before session     Hand Dominance Right   Extremity/Trunk Assessment Upper Extremity Assessment Upper Extremity Assessment: Overall WFL for tasks assessed           Communication Communication Communication: No difficulties   Cognition Arousal/Alertness: Awake/alert Behavior During Therapy: WFL for tasks assessed/performed Overall Cognitive Status: Within Functional Limits for tasks assessed                                                 Home Living Family/patient expects to be discharged to:: Inpatient rehab Living Arrangements: Children;Other relatives Available Help at Discharge: Family;Available 24 hours/day Type of Home: House Home Access: Ramped entrance     Home Layout: One level     Bathroom Shower/Tub: Tub/shower unit;Walk-in shower   Bathroom Toilet: Standard     Home Equipment: Cane - single point;Walker - 2 wheels;Wheelchair - manual;Shower seat          Prior Functioning/Environment Level of Independence: Independent with assistive device(s)        Comments: Used W/C prior to admission due to only weight bearing through LLE was for transfers, pt does have prothesis for RLE--has only worn it a few times but never in a weight bearing position        OT Problem List: Decreased strength;Impaired balance (sitting and/or standing);Pain      OT Treatment/Interventions: Self-care/ADL training;DME and/or AE instruction;Patient/family education;Balance training;Therapeutic exercise    OT Goals(Current goals can be found in the care plan section) Acute Rehab OT Goals Patient Stated Goal: to go to rehab to learn how to use RLE prothesis and to get back to her Mod I level OT Goal Formulation: With patient Time For Goal Achievement: 10/06/19 Potential to Achieve Goals: Good ADL Goals Pt Will Perform Grooming: with modified independence(W/C level) Pt Will Perform Upper Body Bathing: with modified independence(W/C level) Pt Will Perform Lower Body Bathing: with modified independence;sitting/lateral leans(W/C level) Pt Will Perform Upper Body Dressing: with modified independence;bed level(W/C level) Pt Will Perform Lower Body Dressing: with modified independence;bed level Pt Will Transfer to Toilet: with modified independence;anterior/posterior transfer(v. lateral scoots to BSC) Pt Will Perform Toileting - Clothing Manipulation and hygiene:  with modified independence;sitting/lateral leans Pt/caregiver will Perform Home Exercise Program: Increased strength;Both right and left upper extremity;With written HEP provided(theraband level 2) Additional ADL Goal #1: Pt will be Mod I in and OOB for basic ADLs  OT Frequency: Min 2X/week           Co-evaluation PT/OT/SLP Co-Evaluation/Treatment: Yes Reason for Co-Treatment: For patient/therapist safety PT goals addressed during session: Mobility/safety with mobility;Balance;Strengthening/ROM OT goals addressed during session: Strengthening/ROM;ADL's and self-care;Proper use of Adaptive equipment and DME      AM-PAC OT "6 Clicks" Daily Activity     Outcome Measure Help from another person eating meals?: None Help from another person taking care of personal grooming?: A Little Help from another person toileting, which includes using toliet, bedpan, or urinal?: A Lot Help from another person bathing (including washing, rinsing, drying)?: A Little Help from another person to put on and taking off regular upper body clothing?: A Little Help from another person to put on and taking off regular lower body clothing?: A Lot 6 Click Score: 17   End of Session    Activity Tolerance: Patient tolerated treatment well Patient left: in bed;with call bell/phone within reach;with bed  alarm set  OT Visit Diagnosis: Other abnormalities of gait and mobility (R26.89);Muscle weakness (generalized) (M62.81);Pain Pain - Right/Left: Left Pain - part of body: Leg                Time: [id number] OT Time Calculation (min): 24 min Charges:  OT General Charges $OT Visit: 1 Visit OT Evaluation $OT Eval Moderate Complexity: [place], OTR/L Acute [employer] Pager [phone] Office [phone]     Almon Register 09/22/2019, 11:59 AM

## 2019-09-22 NOTE — Progress Notes (Signed)
POD#1 Left BKA. Comfortable. 0cc in Vac . Consult to CIR today

## 2019-09-22 NOTE — Plan of Care (Signed)
  Problem: Education: Goal: Knowledge of General Education information will improve Description: Including pain rating scale, medication(s)/side effects and non-pharmacologic comfort measures Outcome: Adequate for Discharge   Problem: Education: Goal: Knowledge of General Education information will improve Description: Including pain rating scale, medication(s)/side effects and non-pharmacologic comfort measures Outcome: Adequate for Discharge   Problem: Health Behavior/Discharge Planning: Goal: Ability to manage health-related needs will improve Outcome: Adequate for Discharge

## 2019-09-22 NOTE — Anesthesia Postprocedure Evaluation (Signed)
Anesthesia Post Note  Patient: [name]  Procedure(s) Performed: LEFT BELOW KNEE AMPUTATION (Left Knee)     Patient location during evaluation: PACU Anesthesia Type: General Level of consciousness: awake and alert Pain management: pain level controlled Vital Signs Assessment: post-procedure vital signs reviewed and stable Respiratory status: spontaneous breathing, nonlabored ventilation and respiratory function stable Cardiovascular status: blood pressure returned to baseline and stable Postop Assessment: no apparent nausea or vomiting Anesthetic complications: no    Last Vitals:  Vitals:   09/22/19 0304 09/22/19 0805  BP: (!) 158/85 (!) 145/81  Pulse: 86 80  Resp: 16 17  Temp: 36.8 C 37.1 C  SpO2: 99% 99%    Last Pain:  Vitals:   09/22/19 0805  TempSrc: Oral  PainSc:                  Lynda Rainwater

## 2019-09-22 NOTE — Evaluation (Signed)
Physical Therapy Evaluation Patient Details Name: [name] MRN: [id number] DOB: Jan 14, 1966 Today's Date: 09/22/2019   History of Present Illness  Pt is a 54 yo female s/p L transtibial amputation on 09/21/2019, PHMx: R BKA 12/31/18, CHF, anxiety, migraine, depression, DM, PVD, stroke    Clinical Impression  Pt presented supine in bed with HOB elevated, awake and willing to participate in therapy session. Prior to admission, pt reported that she primarily used a w/c for mobility and was able to perform transfers independently. Pt stated that she could also perform ADLs independently. Pt lives with her son and daughter-in-law in a single level home with a ramped entrance. At the time of evaluation, pt overall min guard for bed mobility and supervision to maintain upright sitting at EOB. Therapist demonstrated and instructed pt in AP transfer; however, pt reporting feeling "too weak" to attempt today. Will attempt tomorrow as appropriate. Pt would greatly benefit from short term rehab at CIR to maximize her independence with functional mobility. Feel that pt could progress to a mod I level with the appropriate equipment prior to d/c'ing home with family support. PT will continue to follow pt acutely to progress mobility as tolerated.     Follow Up Recommendations CIR    Equipment Recommendations  None recommended by PT    Recommendations for Other Services Rehab consult     Precautions / Restrictions Precautions Precautions: Fall Precaution Comments: wound vac Required Braces or Orthoses: (residual limb protector for LLE) Restrictions Weight Bearing Restrictions: Yes LLE Weight Bearing: Non weight bearing      Mobility  Bed Mobility Overal bed mobility: Needs Assistance Bed Mobility: Supine to Sit;Sit to Supine     Supine to sit: Min guard;HOB elevated Sit to supine: Min guard   General bed mobility comments: Pt used rails for supine to sit EOB  Transfers                  General transfer comment: Patient reports she feels too weak to attempt AP transfer today, we also discussed lateral transfers (will need a drop arm recliner or W/C for this); pt able to scoot laterally and posteriorly in bed to reposition  Ambulation/Gait                Stairs            Wheelchair Mobility    Modified Rankin (Stroke Patients Only)       Balance Overall balance assessment: Needs assistance Sitting-balance support: No upper extremity supported Sitting balance-Leahy Scale: Fair Sitting balance - Comments: pt able to maintain upright sitting at EOB with supervision for safety       Standing balance comment: Pt does not have prothesis her in hosptial with her                             Pertinent Vitals/Pain Pain Assessment: Faces Pain Score: 6  Faces Pain Scale: Hurts little more Pain Location: L residual limb Pain Descriptors / Indicators: Aching;Throbbing;Sore Pain Intervention(s): Monitored during session;Repositioned    Home Living Family/patient expects to be discharged to:: Private residence Living Arrangements: Children;Other relatives Available Help at Discharge: Family;Available 24 hours/day Type of Home: House Home Access: Ramped entrance     Home Layout: One level Home Equipment: Cane - single point;Walker - 2 wheels;Wheelchair - manual;Shower seat      Prior Function Level of Independence: Independent with assistive device(s)  Comments: Used W/C prior to admission due to only weight bearing through LLE was for transfers, pt does have prothesis for RLE--has only worn it a few times but never in a weight bearing position     Hand Dominance   Dominant Hand: Right    Extremity/Trunk Assessment   Upper Extremity Assessment Upper Extremity Assessment: Defer to OT evaluation;Overall [facility] for tasks assessed    Lower Extremity Assessment Lower Extremity Assessment: LLE deficits/detail LLE Deficits  / Details: pt with decreased strength and ROM limitations secondary to post-op pain and weakness. Limb protector donned throughout LLE: Unable to fully assess due to pain    Cervical / Trunk Assessment Cervical / Trunk Assessment: Normal  Communication   Communication: No difficulties  Cognition Arousal/Alertness: Awake/alert Behavior During Therapy: WFL for tasks assessed/performed Overall Cognitive Status: Within Functional Limits for tasks assessed                                        General Comments      Exercises     Assessment/Plan    PT Assessment Patient needs continued PT services  PT Problem List Decreased strength;Decreased activity tolerance;Decreased balance;Decreased mobility;Decreased coordination;Decreased knowledge of use of DME;Decreased safety awareness;Decreased knowledge of precautions;Pain       PT Treatment Interventions DME instruction;Gait training;Stair training;Functional mobility training;Therapeutic activities;Therapeutic exercise;Balance training;Neuromuscular re-education;Patient/family education    PT Goals (Current goals can be found in the Care Plan section)  Acute Rehab PT Goals Patient Stated Goal: to go to rehab to learn how to use RLE prothesis and to get back to her Mod I level PT Goal Formulation: With patient Time For Goal Achievement: 10/06/19 Potential to Achieve Goals: Good    Frequency Min 5X/week   Barriers to discharge        Co-evaluation PT/OT/SLP Co-Evaluation/Treatment: Yes Reason for Co-Treatment: For patient/therapist safety;To address functional/ADL transfers PT goals addressed during session: Mobility/safety with mobility;Balance;Strengthening/ROM OT goals addressed during session: Strengthening/ROM;ADL's and self-care;Proper use of Adaptive equipment and DME       AM-PAC PT "6 Clicks" Mobility  Outcome Measure Help needed turning from your back to your side while in a flat bed without using  bedrails?: None Help needed moving from lying on your back to sitting on the side of a flat bed without using bedrails?: None Help needed moving to and from a bed to a chair (including a wheelchair)?: A Little Help needed standing up from a chair using your arms (e.g., wheelchair or bedside chair)?: Total Help needed to walk in hospital room?: Total Help needed climbing 3-5 steps with a railing? : Total 6 Click Score: 14    End of Session   Activity Tolerance: Patient limited by lethargy;Patient limited by fatigue Patient left: in bed;with call bell/phone within reach;with bed alarm set Nurse Communication: Mobility status PT Visit Diagnosis: Other abnormalities of gait and mobility (R26.89);Muscle weakness (generalized) (M62.81)    Time: [id number] PT Time Calculation (min) (ACUTE ONLY): 24 min   Charges:   PT Evaluation $PT Eval Moderate Complexity: 1 Mod          Eduard Clos, PT, DPT  Acute Rehabilitation Services Pager [phone] Office [place] 09/22/2019, 12:34 PM

## 2019-09-23 LAB — GLUCOSE, CAPILLARY
Glucose-Capillary: 26 mg/dL — CL (ref 70–99)
Glucose-Capillary: 42 mg/dL — CL (ref 70–99)
Glucose-Capillary: 48 mg/dL — ABNORMAL LOW (ref 70–99)
Glucose-Capillary: 50 mg/dL — ABNORMAL LOW (ref 70–99)
Glucose-Capillary: 76 mg/dL (ref 70–99)
Glucose-Capillary: 115 mg/dL — ABNORMAL HIGH (ref 70–99)
Glucose-Capillary: 132 mg/dL — ABNORMAL HIGH (ref 70–99)
Glucose-Capillary: 148 mg/dL — ABNORMAL HIGH (ref 70–99)
Glucose-Capillary: 60 mg/dL — ABNORMAL LOW (ref 70–99)
Glucose-Capillary: 67 mg/dL — ABNORMAL LOW (ref 70–99)
Glucose-Capillary: 88 mg/dL (ref 70–99)

## 2019-09-23 MED ORDER — KETOROLAC TROMETHAMINE 15 MG/ML IJ SOLN
15.0000 mg | Freq: Three times a day (TID) | INTRAMUSCULAR | Status: AC | PRN
  Filled 2019-09-23 (×2): qty 1

## 2019-09-23 MED ORDER — DEXTROSE 50 % IV SOLN
INTRAVENOUS | Status: AC
  Administered 2019-09-23: 50 mL
  Filled 2019-09-23: qty 50

## 2019-09-23 NOTE — Progress Notes (Signed)
Pt IV got infiltrated, right arm swelling and puffy, IV taken out warm packs applied, wil continue to monitor

## 2019-09-23 NOTE — Progress Notes (Signed)
Complaining of leg pain. Denies Phantom Pain.   Patient didn't eat well last night.  Sugar  In 20's this morning.  Hypoglycemic protocol instituted. She is alert awake will recheck blood sugar. Have stopped insulin for now as Patient was taking a higher dose at home and despite decreasing patient still became hypoglycemic. Right arm IV infiltrated. IV removed. Arm swollen but not red or cellulitic. Pulses intact.   0cc in [place] for CIR

## 2019-09-23 NOTE — Progress Notes (Addendum)
Physical Therapy Treatment Patient Details Name: [name] MRN: [id number] DOB: 05-09-1966 Today's Date: 09/23/2019    History of Present Illness Pt is a 54 yo female s/p L transtibial amputation on 09/21/2019, PHMx: R BKA 12/31/18, CHF, anxiety, migraine, depression, DM, PVD, stroke    PT Comments    Pt seen for mobility progression. She was able to basically perform an AP scoot transfer from one side of the bed to the other; however, she was too scared to attempt to get on the [facility] once at the edge of the bed despite clearly having the strength to be able to perform. Spoke to pt about CIR vs HHPT and really feel that pt needs intensive therapy services in the CIR setting prior to d/c'ing home with family support. Pt agreeable to CIR.   Of note, felt pt's speech was very garbled today as compared to yesterday. Pt with no unilateral weakness or facial dropping. No changes in strength or sensation overall. Pt's RN and charge nurse were both notified and assessed pt.  Pt would continue to benefit from skilled physical therapy services at this time while admitted and after d/c to address the below listed limitations in order to improve overall safety and independence with functional mobility.    Follow Up Recommendations  CIR     Equipment Recommendations  None recommended by PT    Recommendations for Other Services       Precautions / Restrictions Precautions Precautions: Fall Precaution Comments: wound vac Restrictions Weight Bearing Restrictions: Yes LLE Weight Bearing: Non weight bearing    Mobility  Bed Mobility Overal bed mobility: Needs Assistance Bed Mobility: Supine to Sit;Sit to Supine     Supine to sit: Min guard;HOB elevated Sit to supine: Min guard   General bed mobility comments: Pt used rails for supine to sit EOB  Transfers Overall transfer level: Needs assistance   Transfers: Comptroller transfers: Min  guard   General transfer comment: pt performed an A-P scoot from one side of the bed to the other, too scared to attempt to scoot onto [facility]; however, feel that she has the strength to do so  Ambulation/Gait                 Stairs             Wheelchair Mobility    Modified Rankin (Stroke Patients Only)       Balance Overall balance assessment: Needs assistance Sitting-balance support: No upper extremity supported Sitting balance-Leahy Scale: Fair Sitting balance - Comments: pt able to maintain upright sitting at EOB with supervision for safety                                    Cognition Arousal/Alertness: Awake/alert Behavior During Therapy: [facility] for tasks assessed/performed Overall Cognitive Status: Within Functional Limits for tasks assessed                                        Exercises Amputee Exercises Quad Sets: AROM;Strengthening;Left;10 reps;Supine Hip ABduction/ADduction: AROM;Strengthening;Left;10 reps;Supine Knee Flexion: AROM;Strengthening;Left;20 reps;Supine Knee Extension: AROM;Strengthening;Left;20 reps;Supine Straight Leg Raises: AROM;Strengthening;Left;10 reps;Supine    General Comments        Pertinent Vitals/Pain Pain Assessment: 0-10 Pain Score: 8  Pain Location: L residual limb Pain Descriptors /  Indicators: Aching;Throbbing;Sore Pain Intervention(s): Monitored during session;Repositioned    Home Living                      Prior Function            PT Goals (current goals can now be found in the care plan section) Acute Rehab PT Goals PT Goal Formulation: With patient Time For Goal Achievement: 10/06/19 Potential to Achieve Goals: Good Progress towards PT goals: Progressing toward goals    Frequency    Min 5X/week      PT Plan Current plan remains appropriate    Co-evaluation              AM-PAC PT "6 Clicks" Mobility   Outcome Measure  Help needed turning  from your back to your side while in a flat bed without using bedrails?: None Help needed moving from lying on your back to sitting on the side of a flat bed without using bedrails?: None Help needed moving to and from a bed to a chair (including a wheelchair)?: A Little Help needed standing up from a chair using your arms (e.g., wheelchair or bedside chair)?: Total Help needed to walk in hospital room?: Total Help needed climbing 3-5 steps with a railing? : Total 6 Click Score: 14    End of Session   Activity Tolerance: Patient limited by pain Patient left: in bed;with call bell/phone within reach;with bed alarm set Nurse Communication: Mobility status;Other (comment)(pt with garbled speech, different than previous day) PT Visit Diagnosis: Other abnormalities of gait and mobility (R26.89);Muscle weakness (generalized) (M62.81)     Time: [id number] PT Time Calculation (min) (ACUTE ONLY): 23 min  Charges:  $Therapeutic Exercise: 8-22 mins $Therapeutic Activity: 8-22 mins                     Anastasio Champion, DPT  Acute Rehabilitation Services Pager [phone] Office [place] 09/23/2019, 4:21 PM

## 2019-09-23 NOTE — Progress Notes (Addendum)
Hypoglycemic Event  CBG: 26  Treatment:73ml of D50, grape juice with a pack of sugar, 1 bottle of ensure Symptoms: Lethergic Follow-up CBG: Time:0720, 0735, 0755  CBG Result 42, 50,76  Possible Reasons for Event: didn't eat well last night Comments/MD notified:Yes Dr. [name] Given.  Day shift nurse updated, will continue to monitor  [name]

## 2019-09-24 LAB — GLUCOSE, CAPILLARY
Glucose-Capillary: 87 mg/dL (ref 70–99)
Glucose-Capillary: 78 mg/dL (ref 70–99)
Glucose-Capillary: 104 mg/dL — ABNORMAL HIGH (ref 70–99)
Glucose-Capillary: 155 mg/dL — ABNORMAL HIGH (ref 70–99)

## 2019-09-24 NOTE — Progress Notes (Signed)
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  Pt is still anxious, but is needing less assist each session.   Follow Up Recommendations  CIR     Equipment Recommendations  None recommended by PT    Recommendations for Other Services       Precautions / Restrictions Precautions Precautions: Fall Precaution Comments: wound vac Restrictions LLE Weight Bearing: Non weight bearing    Mobility  Bed Mobility Overal bed mobility: Needs Assistance Bed Mobility: Supine to Sit     Supine to sit: Min guard(with flat bed.)     General bed mobility comments: pt came up onto elbows without significant effort.  Transfers Overall transfer level: Needs assistance   Transfers: Comptroller transfers: Min guard;Min assist   General transfer comment: pt needed minimal support for comfort/anxiety, but really could have transferred posteriorly by herself.  Ambulation/Gait                 Stairs             Wheelchair Mobility    Modified Rankin (Stroke Patients Only)       Balance     Sitting balance-Leahy Scale: Fair Sitting balance - Comments: pt able to maintain upright sitting at EOB with supervision for safety                                    Cognition Arousal/Alertness: Awake/alert Behavior During Therapy: [facility] for tasks assessed/performed Overall Cognitive Status: Within Functional Limits for tasks assessed                                        Exercises Amputee Exercises Hip ABduction/ADduction:  AROM;Strengthening;Left;10 reps;Supine Hip Flexion/Marching: AROM;Strengthening;Left;10 reps;Supine Knee Flexion: AROM;Left;10 reps;Supine Knee Extension: AROM;Strengthening;Left;10 reps;Supine Straight Leg Raises: AROM;Strengthening;Left;10 reps;Supine    General Comments        Pertinent Vitals/Pain Pain Assessment: Faces Faces Pain Scale: Hurts little more Pain Location: L residual limb Pain Descriptors / Indicators: Aching;Throbbing;Sore Pain Intervention(s): Monitored during session    Home Living                      Prior Function            PT Goals (current goals can now be found in the care plan section) Acute Rehab PT Goals Patient Stated Goal: to go to rehab to learn how to use RLE prothesis and to get back to her Mod I level PT Goal Formulation: With patient Time For Goal Achievement: 10/06/19 Potential to Achieve Goals: Good Progress towards PT goals: Progressing toward goals    Frequency    Min 5X/week      PT Plan Current plan remains appropriate    Co-evaluation              AM-PAC PT "6 Clicks" Mobility   Outcome Measure  Help needed turning from your back  to your side while in a flat bed without using bedrails?: None Help needed moving from lying on your back to sitting on the side of a flat bed without using bedrails?: None Help needed moving to and from a bed to a chair (including a wheelchair)?: A Little Help needed standing up from a chair using your arms (e.g., wheelchair or bedside chair)?: Total Help needed to walk in hospital room?: Total Help needed climbing 3-5 steps with a railing? : Total 6 Click Score: 14    End of Session   Activity Tolerance: Patient tolerated treatment well Patient left: in chair;with call bell/phone within reach Nurse Communication: Mobility status PT Visit Diagnosis: Other abnormalities of gait and mobility (R26.89);Pain Pain - Right/Left: Left Pain - part of body: Leg     Time:  [id number] PT Time Calculation (min) (ACUTE ONLY): 29 min  Charges:  $Therapeutic Exercise: 8-22 mins $Therapeutic Activity: 8-22 mins                     09/24/2019  [name]., PT Acute Rehabilitation Services [phone]  (pager) [phone]  (office)   [name] 09/24/2019, 4:21 PM

## 2019-09-24 NOTE — Plan of Care (Signed)

## 2019-09-24 NOTE — Progress Notes (Signed)
[name], female   DOB: February 24, 1966, 54 y.o.   MRN: [id number] Patient is postoperative day 3 transtibial amputation there is no drainage in the wound VAC canister 2 checks.  Anticipate discharge to inpatient rehab.

## 2019-09-25 ENCOUNTER — Other Ambulatory Visit: Payer: Self-pay | Admitting: Physician Assistant

## 2019-09-25 LAB — GLUCOSE, CAPILLARY
Glucose-Capillary: 102 mg/dL — ABNORMAL HIGH (ref 70–99)
Glucose-Capillary: 112 mg/dL — ABNORMAL HIGH (ref 70–99)
Glucose-Capillary: 260 mg/dL — ABNORMAL HIGH (ref 70–99)
Glucose-Capillary: 162 mg/dL — ABNORMAL HIGH (ref 70–99)

## 2019-09-25 NOTE — Plan of Care (Signed)
  Problem: Pain Managment: Goal: General experience of comfort will improve Outcome: Progressing   Problem: Safety: Goal: Ability to remain free from injury will improve Outcome: Progressing   Problem: Skin Integrity: Goal: Risk for impaired skin integrity will decrease Outcome: Progressing   

## 2019-09-25 NOTE — Progress Notes (Signed)
Patient BP 192/99, P102 at 0405. Pt states BP always run high. Pt asymptomatic. Pt has pain 5/10 and RN encouraged pt to take her pain medicine as ordered. Patient given prn oxycodone. RN will continue to monitor pt.

## 2019-09-25 NOTE — Plan of Care (Signed)
  Problem: Pain Managment: Goal: General experience of comfort will improve 09/25/2019 0530 by Jackson Latino, RN Outcome: Progressing 09/25/2019 0529 by Jackson Latino, RN Outcome: Progressing   Problem: Safety: Goal: Ability to remain free from injury will improve 09/25/2019 0530 by Jackson Latino, RN Outcome: Progressing 09/25/2019 0529 by Jackson Latino, RN Outcome: Progressing   Problem: Skin Integrity: Goal: Risk for impaired skin integrity will decrease 09/25/2019 0530 by Jackson Latino, RN Outcome: Progressing 09/25/2019 0529 by Jackson Latino, RN Outcome: Progressing

## 2019-09-25 NOTE — TOC Progression Note (Signed)
Transition of Care [facility]) - Progression Note    Patient Details  Name: [name] MRN: [id number] Date of Birth: [birth year]  Transition of Care [facility]) CM/SW [place], LCSW Phone Number: [phone] 09/25/2019, 3:04 PM  Clinical Narrative:     CSW was alerted by RN that patient's son Tommi Rumps wanted to speak with CSW in regards to pateint's discharge plan. Tommi Rumps stated that it was best that patient go to CIR rather than coming home. Tommi Rumps stated that he was going to visit with patient and express his concerns about her coming home. CSW informed Tommi Rumps that it would be noted that it may be a change in the disposition.  TOC team will continue to follow for discharge planning needs.   Expected Discharge Plan: IP Rehab Facility Barriers to Discharge: Continued Medical Work up  Expected Discharge Plan and Services Expected Discharge Plan: [place]                                               Social Determinants of Health (SDOH) Interventions    Readmission Risk Interventions Readmission Risk Prevention Plan 12/14/2018 07/06/2018  Transportation Screening Complete Complete  PCP or Specialist Appt within 5-7 Days - Complete  PCP or Specialist Appt within 3-5 Days Complete -  Home Care Screening - Complete  Medication Review (RN CM) - Complete  HRI or Home Care Consult Complete -  Social Work Consult for Recovery Care Planning/Counseling Complete -  Palliative Care Screening Not Applicable -  Some recent data might be hidden

## 2019-09-25 NOTE — Progress Notes (Signed)
   Subjective: 4 Days Post-Op Procedure(s) (LRB): LEFT BELOW KNEE AMPUTATION (Left) Patient reports pain as mild.    Objective: Vital signs in last 24 hours: Temp:  [98.7 F (37.1 C)-99.7 F (37.6 C)] 98.7 F (37.1 C) (01/17 0753) Pulse Rate:  [85-102] 85 (01/17 0753) Resp:  [16] 16 (01/17 0753) BP: (131-192)/(79-99) 131/79 (01/17 0753) SpO2:  [98 %-99 %] 99 % (01/17 0753)  Intake/Output from previous day: 01/16 0701 - 01/17 0700 In: 480 [P.O.:480] Out: 500 [Urine:500] Intake/Output this shift: Total I/O In: 1094.9 [I.V.:1094.9] Out: -   No results for input(s): HGB in the last 72 hours. No results for input(s): WBC, RBC, HCT, PLT in the last 72 hours. No results for input(s): NA, K, CL, CO2, BUN, CREATININE, GLUCOSE, CALCIUM in the last 72 hours. No results for input(s): LABPT, INR in the last 72 hours.  Neurologically intact No results found.  Assessment/Plan: 4 Days Post-Op Procedure(s) (LRB): LEFT BELOW KNEE AMPUTATION (Left) Plan:  ? IP rehab candidate.   Marybelle Killings 09/25/2019, 3:14 PM

## 2019-09-25 NOTE — Progress Notes (Signed)
Patient's son would like to discuss patient going to CIR. Patient lives with son and daughter in law but he owns his own business and is not at home much to help the patient. Request call from [facility] coordinator [phone] [facility].

## 2019-09-25 NOTE — Plan of Care (Signed)
Blood sugars within normal limits for healing  wound vac draining very little. Spcial work discussed with son possibility of CIR prior to home.   Nsg. DX  Potential for diminished perfusion related to decreased vascular oxygenation   PT therapeutic exorcizes as perscribed  Check cap refilll to stump Q4h Check for increased drainage in wound vac Check popleteal pulses note changes

## 2019-09-26 LAB — GLUCOSE, CAPILLARY
Glucose-Capillary: 125 mg/dL — ABNORMAL HIGH (ref 70–99)
Glucose-Capillary: 162 mg/dL — ABNORMAL HIGH (ref 70–99)
Glucose-Capillary: 158 mg/dL — ABNORMAL HIGH (ref 70–99)
Glucose-Capillary: 173 mg/dL — ABNORMAL HIGH (ref 70–99)

## 2019-09-26 LAB — SURGICAL PATHOLOGY

## 2019-09-26 MED ORDER — LIVING WELL WITH DIABETES BOOK
Freq: Once | Status: AC
  Filled 2019-09-26: qty 1

## 2019-09-26 NOTE — TOC Progression Note (Signed)
Transition of Care [facility]) - Progression Note    Patient Details  Name: ZENNA SPILLER MRN: [id number] Date of Birth: 01-Aug-1966  Transition of Care [facility]) CM/SW Contact  Sharin Mons, RN Phone Number: [phone] 09/26/2019, 2:09 PM  Clinical Narrative:     CIR following for admission ... insurance authorization pending. TOC team will continue to monitor and follow for TOC needs....  Expected Discharge Plan: IP Rehab Facility Barriers to Discharge: Other (comment)  Expected Discharge Plan and Services Expected Discharge Plan: [place]                                               Social Determinants of Health (SDOH) Interventions    Readmission Risk Interventions Readmission Risk Prevention Plan 12/14/2018 07/06/2018  Transportation Screening Complete Complete  PCP or Specialist Appt within 5-7 Days - Complete  PCP or Specialist Appt within 3-5 Days Complete -  Home Care Screening - Complete  Medication Review (RN CM) - Complete  HRI or Home Care Consult Complete -  Social Work Consult for Recovery Care Planning/Counseling Complete -  Palliative Care Screening Not Applicable -  Some recent data might be hidden

## 2019-09-26 NOTE — Progress Notes (Signed)
Patient alert and awake. No drainage in VAC. Patient now requesting CIR. Will place new order for CIR consult

## 2019-09-26 NOTE — Progress Notes (Signed)
Occupational Therapy Treatment Patient Details Name: [name] MRN: [id number] DOB: [birth year] Today's Date: 09/26/2019    History of present illness Pt is a 54 yo female s/p L transtibial amputation on 09/21/2019, PHMx: R BKA 12/31/18, CHF, anxiety, migraine, depression, DM, PVD, stroke   OT comments  Pt making good progress with functional goals. Pt with good leaning side to side technique seated EOB for LB ADLs/selfcare. Pt able to don R prosthesis with sup seated EOB. Pt practiced 4 SPTs: bed to w/c mod A +2, w/c to BSC mod A +2, BSC to w/c max A +2, w/c to recliner mod A +2. Required verbal cues to scoot hips forward and for correct hand placment in prep to stand. Ot will continue to follow acutely  Follow Up Recommendations  CIR;Supervision/Assistance - 24 hour    Equipment Recommendations  Other (comment)(drop arm 3 in 1, TBD)    Recommendations for Other Services      Precautions / Restrictions Precautions Precautions: Fall Precaution Comments: wound vac Restrictions Weight Bearing Restrictions: Yes LLE Weight Bearing: Non weight bearing       Mobility Bed Mobility Overal bed mobility: Needs Assistance Bed Mobility: Supine to Sit     Supine to sit: Supervision        Transfers Overall transfer level: Needs assistance Equipment used: Rolling walker (2 wheeled) Transfers: Sit to/from [facility] Sit to Stand: Mod assist;+2 physical assistance Stand pivot transfers: Mod assist;Max assist;+2 physical assistance       General transfer comment: pt practied 4 transfers: bed to w/c mod A +2, w/c to BSC mod A +2, BSC to w/c max A +2, w/c to recliner mod A +2. Required verbal cues to scoot hips forward and for coorect hand placment in prep to stand    Balance Overall balance assessment: Needs assistance Sitting-balance support: No upper extremity supported Sitting balance-Leahy Scale: Fair Sitting balance - Comments: pt able to maintain upright  sitting at EOB with supervision for safety   Standing balance support: Bilateral upper extremity supported;During functional activity Standing balance-Leahy Scale: Poor Standing balance comment: pt now has R prosthesis                           ADL either performed or assessed with clinical judgement   ADL Overall ADL's : Needs assistance/impaired Eating/Feeding: Independent;Sitting   Grooming: Wash/dry hands;Wash/dry face;Set up;Supervision/safety;Sitting   Upper Body Bathing: Set up;Supervision/ safety;Sitting   Lower Body Bathing: Moderate assistance;Sitting/lateral leans   Upper Body Dressing : Set up;Supervision/safety;Sitting   Lower Body Dressing: Moderate assistance;Sitting/lateral leans   Toilet Transfer: Moderate assistance;Maximal assistance;+2 for physical assistance;Stand-pivot;BSC;Cueing for safety   Toileting- Clothing Manipulation and Hygiene: Minimal assistance;Sitting/lateral lean       Functional mobility during ADLs: Maximal assistance;Moderate assistance;+2 for physical assistance;Cueing for safety       Vision Patient Visual Report: No change from baseline     Perception     Praxis      Cognition                                                Exercises     Shoulder Instructions       General Comments      Pertinent Vitals/ Pain          Home Living  Prior Functioning/Environment              Frequency  Min 2X/week        Progress Toward Goals  OT Goals(current goals can now be found in the care plan section)  Progress towards OT goals: Progressing toward goals  Acute Rehab OT Goals Patient Stated Goal: to go to rehab to learn how to use RLE prothesis and to get back to her Mod I level  Plan Frequency remains appropriate;Discharge plan remains appropriate    Co-evaluation    PT/OT/SLP Co-Evaluation/Treatment: Yes Reason for  Co-Treatment: For patient/therapist safety;To address functional/ADL transfers   OT goals addressed during session: ADL's and self-care;Proper use of Adaptive equipment and DME      AM-PAC OT "6 Clicks" Daily Activity     Outcome Measure   Help from another person eating meals?: None Help from another person taking care of personal grooming?: A Little Help from another person toileting, which includes using toliet, bedpan, or urinal?: A Lot Help from another person bathing (including washing, rinsing, drying)?: A Little Help from another person to put on and taking off regular upper body clothing?: None Help from another person to put on and taking off regular lower body clothing?: A Little 6 Click Score: 19    End of Session Equipment Utilized During Treatment: Gait belt;Rolling walker;Other (comment)(BSC)  OT Visit Diagnosis: Other abnormalities of gait and mobility (R26.89);Muscle weakness (generalized) (M62.81);Pain Pain - Right/Left: Left Pain - part of body: Leg   Activity Tolerance Patient tolerated treatment well   Patient Left in chair;with call bell/phone within reach   Nurse Communication Mobility status        Time: [id number] OT Time Calculation (min): 31 min  Charges: OT General Charges $OT Visit: 1 Visit OT Treatments $Self Care/Home Management : 8-22 mins     Britt Bottom 09/26/2019, 1:10 PM

## 2019-09-26 NOTE — PMR Pre-admission (Signed)
PMR Admission Coordinator Pre-Admission Assessment  Patient: [name] is an 54 y.o., female MRN: [id number] DOB: 1966/05/29 Height: 5' 2"  (157.5 cm) Weight: 72.6 kg  Insurance Information HMO:     PPO: yes     PCP:      IPA:      80/20:      OTHER: PRIMARYDelon Davis      Policy#: [id number]      Subscriber: Patient CM Name: [name]      Phone#: [phone]     Fax#: [phone] Pre-Cert#: [id number]      Employer:  [employer]auf[employer] provided by [name] for admit to CIR. Pt is approved for 7 days with admission 1/19 through 1/25 under CASE [id number]. Concurrent review is due to 1/25 as a verbal update by case manger by calling (p): [phone] and giving nurse verbal clinicals-confirming DC home set up details.  Benefits:  Phone #: [phone]     Name:  Eff. Date: 08/26-2019-10/10/2019     Deduct: $1,750 ($81.29 met)      Out of Pocket Max: $3,000 (includes deductible - $105.20 met)      Life Max:  CIR: 80% coverage, 20% co-insurance      SNF: 80% coverage, 20% co-insurance; limited by necessity reviewed through [facility]: 80% coverage, 20% co-insurance; limited to 90 visits/service period combined all therapies PT/OT/ST   Home Health: 80% coverage, 20% co-insurance; limited to 100 visits/service period      DME: 80% coverage     Co-Pay: 20% co-insurance Providers:   *Pt is aware her insurance terminates on 10/10/19 and if she remains in IP Rehab by 2/1 she will become self pay.   SECONDARY: None      Policy#:       Subscriber:  CM Name:       Phone#:      Fax#:  Pre-Cert#:       Employer:  Benefits:  Phone #:      Name:  Eff. Date:      Deduct:       Out of Pocket Max:       Life Max:  CIR:       SNF:  Outpatient:      Co-Pay: Home Health:       Co-Pay:  DME:     Co-Pay:  Medicaid Application Date:       Case Manager:  Disability Application Date:       Case Worker:   The "Data Collection Information Summary" for patients in Inpatient  Rehabilitation Facilities with attached "Privacy Act [place] Records" was provided and verbally reviewed with: N/A  Emergency Contact Information Contact Information    Name Relation Home Work [address]   [name] Son [phone]        Current Medical History  Patient Admitting Diagnosis: New Left BKA with history of R BKA  History of Present Illness: [name] is a 54 year old female with history of anxiety, chronic kidney disease stage III, acute on chronic diastolic congestive heart failure, CAD with CABG October 2019, remote tobacco abuse 14 years ago, obesity with BMI 29.26 hypertension, intracerebral hemorrhage February 2017 secondary to hypertensive crisis, hyperlipidemia, diabetes mellitus and peripheral vascular disease.  Patient well-known to rehab services after right BKA 12/31/2018 and received inpatient rehab services 01/05/2019 to 01/15/2019 and discharged to home. She does have a prosthesis for right lower extremity that she has used on a limited basis primarily using wheelchair.  Presented  09/21/2019 with gangrenous changes to left heel with significant odor and drainage.  She had been on doxycycline for 2 weeks followed by Dr. [name] Given.  No change with conservative care with increased ischemic changes and underwent left transtibial amputation 09/21/2019 per Dr. [name] Given.  Hospital course pain management with wound VAC as directed.  Therapy evaluations completed and patient was admitted for a comprehensive rehab program.    Patient's medical record from [facility] has been reviewed by the rehabilitation admission coordinator and physician.  Past Medical History  Past Medical History:  Diagnosis Date  . Abscess of great toe, right   . Acute on chronic diastolic CHF (congestive heart failure) ([place]) 06/24/2018  . Acute osteomyelitis of toe, left ([place]) 09/05/2016  . Amputation of right great toe ([place]) 12/22/2018  . Anxiety   . Cataract    left -  surgery to remove  . Cellulitis of foot, right 12/11/2018  . COMMON MIGRAINE 10/07/2010  . Decreased visual acuity 11/10/2016  . Depression 12/18/2012  . Diabetes mellitus type II, uncontrolled ([place]) 10/07/2010   Qualifier: Diagnosis of  By: Charlett Blake MD, [name]    . Diabetic foot infection ([place]) 08/26/2016  . Diabetic infection of right foot ([place])   . Disturbance of skin sensation 10/07/2010  . Gangrene of right foot ([place])   . GERD (gastroesophageal reflux disease)   . Heart murmur   . History of kidney stones    "years ago" - passed stones  . Hyperlipidemia 12/06/2010  . Hypertension   . Hypothyroidism   . Lipoma of abdominal wall 10/05/2016  . Overweight(278.02) 12/06/2010  . PERIPHERAL NEUROPATHY, FEET 10/07/2010  . PVD (peripheral vascular disease) ([place]) 01/21/2012  . RESTLESS LEG SYNDROME 10/25/2010  . Stroke [facility]) 2014, 2017   most recently in 2/17 - intracerebral hemorrhage    Family History   family history includes Alcohol abuse in her brother; Arthritis in her mother; Diabetes in her paternal grandmother; Healthy in her son; Leukemia in her brother; Stroke in her brother.  Prior Rehab/Hospitalizations Has the patient had prior rehab or hospitalizations prior to admission? Yes  Has the patient had major surgery during 100 days prior to admission? Yes   Current Medications  Current Facility-Administered Medications:  .  0.9 %  sodium chloride infusion, , Intravenous, Continuous, Persons, Bevely Palmer, [place], Last Rate: 75 mL/hr at 09/22/19 1256, New Bag at 09/22/19 1256 .  acetaminophen (TYLENOL) tablet 325-650 mg, 325-650 mg, Oral, Q6H PRN, Persons, Bevely Palmer, [place], 650 mg at 09/25/19 0923 .  amLODipine (NORVASC) tablet 5 mg, 5 mg, Oral, Daily, Persons, Bevely Palmer, [place], 5 mg at 09/27/19 0844 .  aspirin EC tablet 81 mg, 81 mg, Oral, Daily, Persons, Bevely Palmer, [place], 81 mg at 09/27/19 0843 .  docusate sodium (COLACE) capsule 100 mg, 100 mg, Oral, BID, Persons, Bevely Palmer, PA, 100 mg at 09/27/19  0844 .  HYDROmorphone (DILAUDID) injection 0.5 mg, 0.5 mg, Intravenous, Q4H PRN, Persons, Bevely Palmer, PA, 0.5 mg at 09/24/19 0059 .  insulin aspart (novoLOG) injection 0-15 Units, 0-15 Units, Subcutaneous, TID WC, Persons, Bevely Palmer, [place], 3 Units at 09/27/19 [phone] .  lactated ringers infusion, , Intravenous, Continuous, Lynda Rainwater, MD, Last Rate: 10 mL/hr at 09/21/19 1223, Restarted at 09/21/19 1319 .  lactated ringers infusion, , Intravenous, Continuous, Lynda Rainwater, MD .  methocarbamol (ROBAXIN) tablet 500 mg, 500 mg, Oral, Q6H PRN, 500 mg at 09/27/19 1130 **OR** methocarbamol (ROBAXIN) 500 mg in dextrose 5 % 50  mL IVPB, 500 mg, Intravenous, Q6H PRN, Persons, Bevely Palmer, PA .  metoCLOPramide (REGLAN) tablet 5-10 mg, 5-10 mg, Oral, Q8H PRN **OR** metoCLOPramide (REGLAN) injection 5-10 mg, 5-10 mg, Intravenous, Q8H PRN, Persons, Bevely Palmer, PA .  ondansetron (ZOFRAN) tablet 4 mg, 4 mg, Oral, Q6H PRN **OR** ondansetron (ZOFRAN) injection 4 mg, 4 mg, Intravenous, Q6H PRN, Persons, Bevely Palmer, PA .  oxyCODONE (Oxy IR/ROXICODONE) immediate release tablet 5-10 mg, 5-10 mg, Oral, Q4H PRN, Persons, Bevely Palmer, PA, 10 mg at 09/27/19 0844  Patients Current Diet:  Diet Order            Diet - low sodium heart healthy        Diet Carb Modified Fluid consistency: Thin; Room service appropriate? Yes  Diet effective now              Precautions / Restrictions Precautions Precautions: Fall Precaution Comments: wound vac Restrictions Weight Bearing Restrictions: Yes LLE Weight Bearing: Non weight bearing   Has the patient had 2 or more falls or a fall with injury in the past year? No  Prior Activity Level Household: did not get out of house much; used wc for mobility inside home. did not work or drive but was independent in ADLs and transfers.   Prior Functional Level Self Care: Did the patient need help bathing, dressing, using the toilet or eating? Independent  Indoor Mobility: Did  the patient need assistance with walking from room to room (with or without device)? Independent  Stairs: Did the patient need assistance with internal or external stairs (with or without device)? Dependent  Functional Cognition: Did the patient need help planning regular tasks such as shopping or remembering to take medications? [place] / Equipment Home Assistive Devices/Equipment: Wheelchair, Environmental consultant (specify type), Dentures (specify type), Eyeglasses, CBG Meter, Prosthesis Home Equipment: Cane - single point, Walker - 2 wheels, Wheelchair - manual, Shower seat  Prior Device Use: Indicate devices/aids used by the patient prior to current illness, exacerbation or injury? Manual wheelchair and RLE prosthetic  Current Functional Level Cognition  Overall Cognitive Status: Within Functional Limits for tasks assessed Orientation Level: Oriented X4 General Comments: Indicated she is motivated to get better    Extremity Assessment (includes Sensation/Coordination)  Upper Extremity Assessment: Defer to OT evaluation, Overall WFL for tasks assessed  Lower Extremity Assessment: LLE deficits/detail LLE Deficits / Details: pt with decreased strength and ROM limitations secondary to post-op pain and weakness. Limb protector donned throughout LLE: Unable to fully assess due to pain    ADLs  Overall ADL's : Needs assistance/impaired Eating/Feeding: Independent, Sitting Eating/Feeding Details (indicate cue type and reason): EOB Grooming: Wash/dry hands, Wash/dry face, Set up, Supervision/safety, Sitting Grooming Details (indicate cue type and reason): EOB Upper Body Bathing: Set up, Supervision/ safety, Sitting Upper Body Bathing Details (indicate cue type and reason): sitting EOB Lower Body Bathing: Moderate assistance, Sitting/lateral leans Upper Body Dressing : Set up, Supervision/safety, Sitting Upper Body Dressing Details (indicate cue type and reason): EOB Lower  Body Dressing: Moderate assistance, Sitting/lateral leans Toilet Transfer: Moderate assistance, Maximal assistance, +2 for physical assistance, Stand-pivot, BSC, Cueing for safety Toilet Transfer Details (indicate cue type and reason): Demonstrated to patient anterior (forward) and posterior (backward) to 3n1 from couch in pt's room (pt feels too weak to try this today) Toileting- Clothing Manipulation and Hygiene: Minimal assistance, Sitting/lateral lean Functional mobility during ADLs: Maximal assistance, Moderate assistance, +2 for physical assistance, Cueing for safety    Mobility  Overal bed mobility: Needs Assistance Bed Mobility: Supine to Sit Supine to sit: Supervision Sit to supine: Min guard General bed mobility comments: pt came up onto elbows without significant effort.    Transfers  Overall transfer level: Needs assistance Equipment used: Rolling walker (2 wheeled) Transfers: Sit to/from Stand, [employer] Transfers Sit to Stand: Mod assist, +2 physical assistance Stand pivot transfers: Mod assist, Max assist, +2 physical assistance Anterior-Posterior transfers: Min guard, Min assist General transfer comment: Pt practiced 4 SPTs: bed to w/c mod A +2, w/c to BSC mod A +2, BSC to w/c max A +2, w/c to recliner mod A +2. Required verbal cues to scoot hips forward and for correct hand placment in prep to stand; Attempted straight sit to stand from the bed with RW and heavy mod assist, and pt was unable to get to fully extended R knee for standing    Ambulation / Gait / Stairs / Environmental consultant mobility: Yes Wheelchair propulsion: Both upper extremities Distance: in room management, including turns Wheelchair Assistance Details (indicate cue type and reason): Managed in room propulsion, turns, and brakes well    Posture / Balance Dynamic Sitting Balance Sitting balance - Comments: pt able to maintain upright sitting at EOB with supervision for  safety, including while donning prosthesis Balance Overall balance assessment: Needs assistance Sitting-balance support: No upper extremity supported Sitting balance-Leahy Scale: Fair Sitting balance - Comments: pt able to maintain upright sitting at EOB with supervision for safety, including while donning prosthesis Standing balance support: Bilateral upper extremity supported, During functional activity Standing balance-Leahy Scale: Poor Standing balance comment: pt now has R prosthesis    Special needs/care consideration BiPAP/CPAP : no CPM : no Continuous Drip IV: 0.9% sodium chloride infusion, lactated ringers infusion Dialysis : no        Days : no Life Vest : no Oxygen : no Special Bed : no Trach Size : no Wound Vac (area) : no      Location : no Skin: surgical incision L BKA, old R BKA; negative pressure wound therapy left leg.  Bowel mgmt: last BM: 09/21/19 Bladder mgmt: continent, external catheter in place Diabetic mgmt: yes Behavioral consideration : no Chemo/radiation : no   Previous Home Environment (from acute therapy documentation) Living Arrangements: Children, Other relatives Available Help at Discharge: Family, Available 24 hours/day Type of Home: House Home Layout: One level Home Access: Ramped entrance Bathroom Shower/Tub: Tub/shower unit, Multimedia programmer: [place]: Yes Type of Home Care Services: Home RN, [place] (if known): [facility]  Discharge Living Setting Plans for Discharge Living Setting: Patient's home, Lives with (comment)(son and daughter in law) Type of Home at Discharge: House Discharge Home Layout: Two level, Full bath on main level, Able to live on main level with bedroom/bathroom Alternate Level Stairs-Rails: None Alternate Level Stairs-Number of Steps: NA Discharge Home Access: Ramped entrance Discharge Bathroom Shower/Tub: Walk-in shower(but wc cannot fit into bathroom; does bird  baths on [facility]) Discharge Bathroom Toilet: Handicapped height Discharge Bathroom Accessibility: No Does the patient have any problems obtaining your medications?: No  Social/Family/Support Systems Patient Roles: Other (Comment)(lives with son and daugther in law) Contact Information: Tommi Rumps (son): [phone] Anticipated Caregiver: son and daughter in law Anticipated Caregiver's Contact Information: see above Ability/Limitations of Caregiver: supervision/Min A Caregiver Availability: 24/7 Discharge Plan Discussed with Primary Caregiver: Yes(pt's son Tommi Rumps) Is Caregiver In Agreement with Plan?: Yes Does Caregiver/Family have Issues with Lodging/Transportation  while Pt is in Rehab?: No  Goals/Additional Needs Patient/Family Goal for Rehab: PT/OT: Supervision; SLP: NA Expected length of stay: 7-10 days  Cultural Considerations: TBD Dietary Needs: carb modified, thin liquids. Calorie level 1600-2000.  Equipment Needs: TBD Additional Information: pt has RLE prosthetic in room  Pt/Family Agrees to Admission and willing to participate: Yes Program Orientation Provided & Reviewed with Pt/Caregiver Including Roles  & Responsibilities: Yes(pt and son )  Barriers to Discharge: Home environment access/layout, Insurance for SNF coverage  Barriers to Discharge Comments: bathroom not condusive to wc accessibility.   Decrease burden of Care through IP rehab admission: Other   Possible need for SNF placement upon discharge: Not anticipated. Pt had good social support at DC and an accessible home for wc entry. Confirmed with pt's son that he or his wife can be present at DC with pt and provide Min A as needed.   Patient Condition: I have reviewed medical records from [facility], spoken with PA, and patient and pt's son Tommi Rumps. I met with patient at the bedside for inpatient rehabilitation assessment.  Patient will benefit from ongoing PT and OT, can actively participate in 3 hours of  therapy a day 5 days of the week, and can make measurable gains during the admission.  Patient will also benefit from the coordinated team approach during an Inpatient Acute Rehabilitation admission.  The patient will receive intensive therapy as well as Rehabilitation physician, nursing, social worker, and care management interventions.  Due to safety, skin/wound care, disease management, medication administration, pain management and patient education the patient requires 24 hour a day rehabilitation nursing.  The patient is currently Mod A/Max A + 2 for stand pivot transfers and Supervision to Max A +2 for basic ADLs.  Discharge setting and therapy post discharge at home with home health is anticipated.  Patient has agreed to participate in the Acute Inpatient Rehabilitation Program and will admit 09/27/19.  Preadmission Screen Completed By:  Raechel Ache, 09/27/2019 12:21 PM ______________________________________________________________________   Discussed status with Dr. Ranell Patrick on 09/27/19 at 12:20PM and received approval for admission today.  Admission Coordinator:  Raechel Ache, OT, time 12:20PM/Date 09/26/18   Assessment/Plan: Diagnosis: Left transtibial amputation 1. Does the need for close, 24 hr/day Medical supervision in concert with the patient's rehab needs make it unreasonable for this patient to be served in a less intensive setting? Yes 2. Co-Morbidities requiring supervision/potential complications: anemia of chronic disease, migraine without aura, hereditary and idiopathic peripheral neuropathy, essential HTN, heart murmur 3. Due to bladder management, bowel management, safety, skin/wound care, disease management, medication administration, pain management and patient education, does the patient require 24 hr/day rehab nursing? Yes 4. Does the patient require coordinated care of a physician, rehab nurse, PT, OT to address physical and functional deficits in the context of the above  medical diagnosis(es)? Yes Addressing deficits in the following areas: balance, endurance, locomotion, strength, transferring, bowel/bladder control, bathing, dressing, feeding, grooming, toileting and psychosocial support 5. Can the patient actively participate in an intensive therapy program of at least 3 hrs of therapy 5 days a week? Yes 6. The potential for patient to make measurable gains while on inpatient rehab is excellent 7. Anticipated functional outcomes upon discharge from inpatient rehab: modified independent PT, modified independent OT, independent SLP 8. Estimated rehab length of stay to reach the above functional goals is: 10-14 days 9. Anticipated discharge destination: Home 10. Overall Rehab/Functional Prognosis: excellent   MD Signature: Leeroy Cha, MD

## 2019-09-26 NOTE — Progress Notes (Addendum)
Inpatient Rehabilitation-Admissions Coordinator   Met with pt at the bedside. She now wants to pursue CIR. I will need updated therapy notes prior to beginning insurance authorization for possible admit.  PT notified of need for updated notes.   Will follow along.   Raechel Ache, OTR/L  Rehab Admissions Coordinator  [phone] 09/26/2019 10:43 AM   ADDENDUM 2:22PM: Insurance authorization process initiated. AC will update once there has been an Medical illustrator for CIR.   Raechel Ache, OTR/L  Rehab Admissions Coordinator  [phone] 09/26/2019 2:22 PM

## 2019-09-26 NOTE — Progress Notes (Signed)
Inpatient Diabetes Program Recommendations  AACE/ADA: New Consensus Statement on Inpatient Glycemic Control (2015)  Target Ranges:  Prepandial:   less than 140 mg/dL      Peak postprandial:   less than 180 mg/dL (1-2 hours)      Critically ill patients:  140 - 180 mg/dL   Lab Results  Component Value Date   GLUCAP 162 (H) 09/26/2019   HGBA1C 14.1 (H) 09/21/2019    Review of Glycemic Control Results for [name], [name] (MRN [id number]) as of 09/26/2019 15:21  Ref. Range 09/25/2019 12:07 09/25/2019 17:13 09/25/2019 21:24 09/26/2019 06:39 09/26/2019 11:04  Glucose-Capillary Latest Ref Range: 70 - 99 mg/dL 112 (H) 260 (H) 162 (H) 125 (H) 162 (H)   Diabetes history: DM2 Outpatient Diabetes medications: Lantus 50 units bid Current orders for Inpatient glycemic control: Novolog moderate correction tid  Inpatient Diabetes Program Recommendations:   Patient states she takes her Lantus regularly but has never taken Humulin R ac meals. Spoke with pt about A1C results 14.1  with them and explained what an A1C is, basic pathophysiology of DM Type 2, basic home care, basic diabetes diet nutrition principles, importance of checking CBGs and maintaining good CBG control to prevent long-term and short-term complications. Reviewed signs and symptoms of hyperglycemia and hypoglycemia and how to treat hypoglycemia at home. Also reviewed blood sugar goals at home.  RNs to provide ongoing basic DM education at bedside with this patient.   Will follow.  Thank you, Nani Gasser. Ramiah Helfrich, RN, MSN, CDE  Diabetes Coordinator Inpatient Glycemic Control Team Team Pager [phone] (8am-5pm) 09/26/2019 3:36 PM

## 2019-09-26 NOTE — Plan of Care (Signed)
  Problem: Pain Managment: Goal: General experience of comfort will improve Outcome: Progressing   Problem: Safety: Goal: Ability to remain free from injury will improve Outcome: Progressing   Problem: Skin Integrity: Goal: Risk for impaired skin integrity will decrease Outcome: Progressing   

## 2019-09-26 NOTE — Progress Notes (Signed)
Physical Therapy Treatment Patient Details Name: [name] MRN: [id number] DOB: May 19, 1966 Today's Date: 09/26/2019    History of Present Illness Pt is a 54 yo female s/p L transtibial amputation on 09/21/2019, PHMx: R BKA 12/31/18, CHF, anxiety, migraine, depression, DM, PVD, stroke    PT Comments    Continuing work on functional mobility and activity tolerance;  Pt's R prosthesis was in her room and that allowed [name] to work on more pivot-type transfers; Pt able to don R prosthesis with sup seated EOB. Pt practiced 4 SPTs: bed to w/c mod A +2, w/c to BSC mod A +2, BSC to w/c max A +2, w/c to recliner mod A +2. Required verbal cues to scoot hips forward and for correct hand placment in prep to stand; She took time to emphasize to [name] that she is motivated to get better, and would very much like to go to Rehab; Continue to recommend comprehensive inpatient rehab (CIR) for post-acute therapy needs.    Follow Up Recommendations  CIR     Equipment Recommendations  None recommended by PT    Recommendations for Other Services       Precautions / Restrictions Precautions Precautions: Fall Precaution Comments: wound vac Required Braces or Orthoses: (residual limb protector for LLE) Restrictions Weight Bearing Restrictions: Yes LLE Weight Bearing: Non weight bearing    Mobility  Bed Mobility Overal bed mobility: Needs Assistance Bed Mobility: Supine to Sit     Supine to sit: Supervision     General bed mobility comments: pt came up onto elbows without significant effort.  Transfers Overall transfer level: Needs assistance Equipment used: Rolling walker (2 wheeled) Transfers: Sit to/from [facility] Sit to Stand: Mod assist;+2 physical assistance Stand pivot transfers: Mod assist;Max assist;+2 physical assistance       General transfer comment: Pt practiced 4 SPTs: bed to w/c mod A +2, w/c to BSC mod A +2, BSC to w/c max A +2, w/c to recliner mod A +2.  Required verbal cues to scoot hips forward and for correct hand placment in prep to stand; Attempted straight sit to stand from the bed with RW and heavy mod assist, and pt was unable to get to fully extended R knee for standing  Ambulation/Gait                 Theme park manager mobility: Yes Wheelchair propulsion: Both upper extremities Distance: in room management, including turns Wheelchair Assistance Details (indicate cue type and reason): Managed in room propulsion, turns, and brakes well  Modified Rankin (Stroke Patients Only)       Balance Overall balance assessment: Needs assistance Sitting-balance support: No upper extremity supported Sitting balance-Leahy Scale: Fair Sitting balance - Comments: pt able to maintain upright sitting at EOB with supervision for safety, including while donning prosthesis   Standing balance support: Bilateral upper extremity supported;During functional activity Standing balance-Leahy Scale: Poor Standing balance comment: pt now has R prosthesis                            Cognition Arousal/Alertness: Awake/alert Behavior During Therapy: WFL for tasks assessed/performed Overall Cognitive Status: Within Functional Limits for tasks assessed  General Comments: Indicated she is motivated to get better      Exercises      General Comments        Pertinent Vitals/Pain Pain Assessment: Faces Faces Pain Scale: Hurts a little bit Pain Location: L residual limb Pain Descriptors / Indicators: Aching;Throbbing;Sore Pain Intervention(s): Monitored during session    Home Living                      Prior Function            PT Goals (current goals can now be found in the care plan section) Acute Rehab PT Goals Patient Stated Goal: to go to rehab to learn how to use RLE prothesis and to get back to her Mod I  level PT Goal Formulation: With patient Time For Goal Achievement: 10/06/19 Potential to Achieve Goals: Good Progress towards PT goals: Progressing toward goals    Frequency    Min 5X/week      PT Plan Current plan remains appropriate    Co-evaluation PT/OT/SLP Co-Evaluation/Treatment: Yes Reason for Co-Treatment: For patient/therapist safety;To address functional/ADL transfers PT goals addressed during session: Mobility/safety with mobility OT goals addressed during session: ADL's and self-care;Proper use of Adaptive equipment and DME      AM-PAC PT "6 Clicks" Mobility   Outcome Measure  Help needed turning from your back to your side while in a flat bed without using bedrails?: None Help needed moving from lying on your back to sitting on the side of a flat bed without using bedrails?: None Help needed moving to and from a bed to a chair (including a wheelchair)?: A Little Help needed standing up from a chair using your arms (e.g., wheelchair or bedside chair)?: A Lot Help needed to walk in hospital room?: Total Help needed climbing 3-5 steps with a railing? : Total 6 Click Score: 15    End of Session Equipment Utilized During Treatment: Gait belt Activity Tolerance: Patient tolerated treatment well Patient left: in chair;with call bell/phone within reach Nurse Communication: Mobility status PT Visit Diagnosis: Other abnormalities of gait and mobility (R26.89);Pain Pain - Right/Left: Left Pain - part of body: Leg     Time: 1227-1258 PT Time Calculation (min) (ACUTE ONLY): 31 min  Charges:  $Therapeutic Activity: 8-22 mins                     [name], PT  Acute Rehabilitation Services Pager [phone] Office [place] 09/26/2019, 1:56 PM

## 2019-09-27 ENCOUNTER — Encounter (HOSPITAL_COMMUNITY): Payer: Self-pay | Admitting: Physical Medicine and Rehabilitation

## 2019-09-27 ENCOUNTER — Other Ambulatory Visit: Payer: Self-pay

## 2019-09-27 ENCOUNTER — Inpatient Hospital Stay (HOSPITAL_COMMUNITY)
Admission: RE | Admit: 2019-09-27 | Discharge: 2019-10-12 | DRG: 560 | Disposition: A | Discharge status: Home Health | Payer: BC Managed Care – PPO | Source: Intra-hospital | Attending: Physical Medicine and Rehabilitation | Admitting: Physical Medicine and Rehabilitation

## 2019-09-27 DIAGNOSIS — M86172 Other acute osteomyelitis, left ankle and foot: Secondary | ICD-10-CM

## 2019-09-27 DIAGNOSIS — Z833 Family history of diabetes mellitus: Secondary | ICD-10-CM

## 2019-09-27 DIAGNOSIS — Z8261 Family history of arthritis: Secondary | ICD-10-CM

## 2019-09-27 DIAGNOSIS — K219 Gastro-esophageal reflux disease without esophagitis: Secondary | ICD-10-CM | POA: Diagnosis present

## 2019-09-27 DIAGNOSIS — I5032 Chronic diastolic (congestive) heart failure: Secondary | ICD-10-CM | POA: Diagnosis not present

## 2019-09-27 DIAGNOSIS — E1151 Type 2 diabetes mellitus with diabetic peripheral angiopathy without gangrene: Secondary | ICD-10-CM | POA: Diagnosis present

## 2019-09-27 DIAGNOSIS — Z89511 Acquired absence of right leg below knee: Secondary | ICD-10-CM

## 2019-09-27 DIAGNOSIS — Z87891 Personal history of nicotine dependence: Secondary | ICD-10-CM | POA: Diagnosis not present

## 2019-09-27 DIAGNOSIS — Z885 Allergy status to narcotic agent status: Secondary | ICD-10-CM | POA: Diagnosis not present

## 2019-09-27 DIAGNOSIS — Z806 Family history of leukemia: Secondary | ICD-10-CM

## 2019-09-27 DIAGNOSIS — Z951 Presence of aortocoronary bypass graft: Secondary | ICD-10-CM | POA: Diagnosis not present

## 2019-09-27 DIAGNOSIS — Z8673 Personal history of transient ischemic attack (TIA), and cerebral infarction without residual deficits: Secondary | ICD-10-CM

## 2019-09-27 DIAGNOSIS — Z811 Family history of alcohol abuse and dependence: Secondary | ICD-10-CM

## 2019-09-27 DIAGNOSIS — K5901 Slow transit constipation: Secondary | ICD-10-CM | POA: Diagnosis not present

## 2019-09-27 DIAGNOSIS — I2510 Atherosclerotic heart disease of native coronary artery without angina pectoris: Secondary | ICD-10-CM | POA: Diagnosis present

## 2019-09-27 DIAGNOSIS — Z880 Allergy status to penicillin: Secondary | ICD-10-CM | POA: Diagnosis not present

## 2019-09-27 DIAGNOSIS — N390 Urinary tract infection, site not specified: Secondary | ICD-10-CM | POA: Diagnosis not present

## 2019-09-27 DIAGNOSIS — F4323 Adjustment disorder with mixed anxiety and depressed mood: Secondary | ICD-10-CM | POA: Diagnosis present

## 2019-09-27 DIAGNOSIS — Z79890 Hormone replacement therapy (postmenopausal): Secondary | ICD-10-CM

## 2019-09-27 DIAGNOSIS — E1142 Type 2 diabetes mellitus with diabetic polyneuropathy: Secondary | ICD-10-CM | POA: Diagnosis present

## 2019-09-27 DIAGNOSIS — Z888 Allergy status to other drugs, medicaments and biological substances status: Secondary | ICD-10-CM

## 2019-09-27 DIAGNOSIS — Z7982 Long term (current) use of aspirin: Secondary | ICD-10-CM

## 2019-09-27 DIAGNOSIS — E1122 Type 2 diabetes mellitus with diabetic chronic kidney disease: Secondary | ICD-10-CM | POA: Diagnosis not present

## 2019-09-27 DIAGNOSIS — Z89512 Acquired absence of left leg below knee: Secondary | ICD-10-CM

## 2019-09-27 DIAGNOSIS — E785 Hyperlipidemia, unspecified: Secondary | ICD-10-CM | POA: Diagnosis not present

## 2019-09-27 DIAGNOSIS — I130 Hypertensive heart and chronic kidney disease with heart failure and stage 1 through stage 4 chronic kidney disease, or unspecified chronic kidney disease: Secondary | ICD-10-CM | POA: Diagnosis present

## 2019-09-27 DIAGNOSIS — Z79899 Other long term (current) drug therapy: Secondary | ICD-10-CM

## 2019-09-27 DIAGNOSIS — E039 Hypothyroidism, unspecified: Secondary | ICD-10-CM | POA: Diagnosis not present

## 2019-09-27 DIAGNOSIS — Z4781 Encounter for orthopedic aftercare following surgical amputation: Principal | ICD-10-CM

## 2019-09-27 DIAGNOSIS — N183 Chronic kidney disease, stage 3 unspecified: Secondary | ICD-10-CM | POA: Diagnosis present

## 2019-09-27 DIAGNOSIS — K5900 Constipation, unspecified: Secondary | ICD-10-CM | POA: Diagnosis present

## 2019-09-27 DIAGNOSIS — Z823 Family history of stroke: Secondary | ICD-10-CM | POA: Diagnosis not present

## 2019-09-27 DIAGNOSIS — G2581 Restless legs syndrome: Secondary | ICD-10-CM | POA: Diagnosis present

## 2019-09-27 DIAGNOSIS — Z794 Long term (current) use of insulin: Secondary | ICD-10-CM

## 2019-09-27 DIAGNOSIS — E1169 Type 2 diabetes mellitus with other specified complication: Secondary | ICD-10-CM | POA: Diagnosis not present

## 2019-09-27 DIAGNOSIS — E1152 Type 2 diabetes mellitus with diabetic peripheral angiopathy with gangrene: Secondary | ICD-10-CM | POA: Diagnosis present

## 2019-09-27 DIAGNOSIS — I10 Essential (primary) hypertension: Secondary | ICD-10-CM | POA: Diagnosis not present

## 2019-09-27 LAB — GLUCOSE, CAPILLARY
Glucose-Capillary: 154 mg/dL — ABNORMAL HIGH (ref 70–99)
Glucose-Capillary: 123 mg/dL — ABNORMAL HIGH (ref 70–99)
Glucose-Capillary: 157 mg/dL — ABNORMAL HIGH (ref 70–99)

## 2019-09-27 MED ORDER — OXYCODONE HCL 5 MG PO TABS
5.0000 mg | ORAL_TABLET | ORAL | Status: DC | PRN
  Administered 2019-09-27 – 2019-09-28 (×3): 5 mg via ORAL
  Administered 2019-09-28 – 2019-10-01 (×6): 10 mg via ORAL
  Administered 2019-10-01 (×2): 5 mg via ORAL
  Administered 2019-10-03 – 2019-10-04 (×4): 10 mg via ORAL
  Administered 2019-10-05 – 2019-10-08 (×3): 5 mg via ORAL
  Administered 2019-10-10: 10 mg via ORAL
  Administered 2019-10-12: 5 mg via ORAL
  Filled 2019-09-27 (×2): qty 1
  Filled 2019-09-27: qty 2
  Filled 2019-09-27: qty 1
  Filled 2019-09-27 (×7): qty 2
  Filled 2019-09-27: qty 1
  Filled 2019-09-27 (×5): qty 2
  Filled 2019-09-27: qty 1
  Filled 2019-09-27 (×2): qty 2

## 2019-09-27 MED ORDER — METHOCARBAMOL 500 MG PO TABS
500.0000 mg | ORAL_TABLET | Freq: Four times a day (QID) | ORAL | Status: DC | PRN
  Administered 2019-10-04 (×2): 500 mg via ORAL
  Filled 2019-09-27 (×2): qty 1

## 2019-09-27 MED ORDER — SENNOSIDES-DOCUSATE SODIUM 8.6-50 MG PO TABS
1.0000 | ORAL_TABLET | Freq: Two times a day (BID) | ORAL | Status: DC
  Administered 2019-09-27 – 2019-09-30 (×7): 1 via ORAL
  Filled 2019-09-27 (×8): qty 1

## 2019-09-27 MED ORDER — METHOCARBAMOL 1000 MG/10ML IJ SOLN
500.0000 mg | Freq: Four times a day (QID) | INTRAVENOUS | Status: DC | PRN
  Filled 2019-09-27: qty 5

## 2019-09-27 MED ORDER — ONDANSETRON HCL 4 MG/2ML IJ SOLN
4.0000 mg | Freq: Four times a day (QID) | INTRAMUSCULAR | Status: DC | PRN
  Administered 2019-10-10: 4 mg via INTRAVENOUS
  Filled 2019-09-27: qty 2

## 2019-09-27 MED ORDER — ACETAMINOPHEN 325 MG PO TABS
325.0000 mg | ORAL_TABLET | Freq: Four times a day (QID) | ORAL | Status: DC | PRN
  Administered 2019-10-01 – 2019-10-10 (×3): 650 mg via ORAL
  Filled 2019-09-27 (×4): qty 2

## 2019-09-27 MED ORDER — ONDANSETRON HCL 4 MG PO TABS
4.0000 mg | ORAL_TABLET | Freq: Four times a day (QID) | ORAL | Status: DC | PRN
  Administered 2019-09-29 – 2019-10-11 (×6): 4 mg via ORAL
  Filled 2019-09-27 (×7): qty 1

## 2019-09-27 MED ORDER — ASPIRIN EC 81 MG PO TBEC
81.0000 mg | DELAYED_RELEASE_TABLET | Freq: Every day | ORAL | Status: DC
  Administered 2019-09-28 – 2019-10-12 (×15): 81 mg via ORAL
  Filled 2019-09-27 (×15): qty 1

## 2019-09-27 MED ORDER — INSULIN ASPART 100 UNIT/ML ~~LOC~~ SOLN
0.0000 [IU] | Freq: Three times a day (TID) | SUBCUTANEOUS | Status: DC
  Administered 2019-09-28 (×2): 2 [IU] via SUBCUTANEOUS
  Administered 2019-09-28 – 2019-09-29 (×2): 3 [IU] via SUBCUTANEOUS
  Administered 2019-09-29: 13:00:00 2 [IU] via SUBCUTANEOUS
  Administered 2019-09-29: 08:00:00 3 [IU] via SUBCUTANEOUS
  Administered 2019-09-30: 18:00:00 2 [IU] via SUBCUTANEOUS
  Administered 2019-09-30 – 2019-10-01 (×3): 3 [IU] via SUBCUTANEOUS
  Administered 2019-10-01 – 2019-10-02 (×2): 2 [IU] via SUBCUTANEOUS
  Administered 2019-10-02: 12:00:00 3 [IU] via SUBCUTANEOUS
  Administered 2019-10-02: 2 [IU] via SUBCUTANEOUS
  Administered 2019-10-03: 3 [IU] via SUBCUTANEOUS
  Administered 2019-10-03: 12:00:00 5 [IU] via SUBCUTANEOUS
  Administered 2019-10-03: 2 [IU] via SUBCUTANEOUS
  Administered 2019-10-04: 5 [IU] via SUBCUTANEOUS
  Administered 2019-10-05 (×2): 3 [IU] via SUBCUTANEOUS
  Administered 2019-10-05: 5 [IU] via SUBCUTANEOUS
  Administered 2019-10-06 (×2): 3 [IU] via SUBCUTANEOUS
  Administered 2019-10-06: 2 [IU] via SUBCUTANEOUS
  Administered 2019-10-07 (×3): 3 [IU] via SUBCUTANEOUS
  Administered 2019-10-08: 13:00:00 2 [IU] via SUBCUTANEOUS
  Administered 2019-10-08 (×2): 3 [IU] via SUBCUTANEOUS
  Administered 2019-10-09: 5 [IU] via SUBCUTANEOUS
  Administered 2019-10-09 (×2): 3 [IU] via SUBCUTANEOUS
  Administered 2019-10-10: 2 [IU] via SUBCUTANEOUS
  Administered 2019-10-10 – 2019-10-11 (×4): 3 [IU] via SUBCUTANEOUS
  Administered 2019-10-12 (×2): 2 [IU] via SUBCUTANEOUS

## 2019-09-27 MED ORDER — SORBITOL 70 % SOLN
30.0000 mL | Freq: Every day | Status: DC | PRN
  Administered 2019-09-27 – 2019-10-02 (×2): 30 mL via ORAL
  Filled 2019-09-27 (×3): qty 30

## 2019-09-27 MED ORDER — AMLODIPINE BESYLATE 5 MG PO TABS
5.0000 mg | ORAL_TABLET | Freq: Every day | ORAL | Status: DC
  Administered 2019-09-28: 5 mg via ORAL
  Filled 2019-09-27: qty 1

## 2019-09-27 NOTE — H&P (Signed)
Physical Medicine and Rehabilitation Admission H&P    Chief complaint: Stump pain  HPI: [name] is a 54 year old right-handed female with history of anxiety, chronic kidney disease stage III, acute on chronic diastolic congestive heart failure, CAD with CABG October 2019, remote tobacco abuse 14 years ago, obesity with BMI 29.26 hypertension, intracerebral hemorrhage February 2017 secondary to hypertensive crisis, hyperlipidemia, diabetes mellitus and peripheral vascular disease.  Patient well-known to rehab services after right BKA 12/31/2018 and received inpatient rehab services 01/05/2019 to 01/15/2019 and discharged to home.  Per chart review patient lives with her children.  1 level home ramped entrance.  She does have a prosthesis for right lower extremity that she has used on a limited basis primarily using wheelchair.  Presented 09/21/2019 with gangrenous changes to left heel with significant odor and drainage.  She had been on doxycycline for 2 weeks followed by Dr. [name] Given.  No change with conservative care with increased ischemic changes and underwent left transtibial amputation 09/21/2019 per Dr. [name] Given.  Hospital course pain management with wound VAC as directed.  Therapy evaluations completed and patient was admitted for a comprehensive rehab program.  Review of Systems  Constitutional: Negative for chills and fever.  HENT: Negative for hearing loss.   Eyes: Negative for blurred vision and double vision.  Respiratory: Negative for cough and shortness of breath.   Cardiovascular: Positive for leg swelling. Negative for chest pain and palpitations.  Gastrointestinal: Positive for constipation. Negative for heartburn, nausea and vomiting.       GERD  Genitourinary: Negative for dysuria, flank pain and hematuria.  Musculoskeletal: Positive for joint pain and myalgias.  Skin: Negative for rash.  Psychiatric/Behavioral: Positive for depression. The patient has insomnia.    Anxiety  All other systems reviewed and are negative.  Past Medical History:  Diagnosis Date  . Abscess of great toe, right   . Acute on chronic diastolic CHF (congestive heart failure) ([place]) 06/24/2018  . Acute osteomyelitis of toe, left ([place]) 09/05/2016  . Amputation of right great toe ([place]) 12/22/2018  . Anxiety   . Cataract    left - surgery to remove  . Cellulitis of foot, right 12/11/2018  . COMMON MIGRAINE 10/07/2010  . Decreased visual acuity 11/10/2016  . Depression 12/18/2012  . Diabetes mellitus type II, uncontrolled ([place]) 10/07/2010   Qualifier: Diagnosis of  By: Charlett Blake MD, [name]    . Diabetic foot infection ([place]) 08/26/2016  . Diabetic infection of right foot ([place])   . Disturbance of skin sensation 10/07/2010  . Gangrene of right foot ([place])   . GERD (gastroesophageal reflux disease)   . Heart murmur   . History of kidney stones    "years ago" - passed stones  . Hyperlipidemia 12/06/2010  . Hypertension   . Hypothyroidism   . Lipoma of abdominal wall 10/05/2016  . Overweight(278.02) 12/06/2010  . PERIPHERAL NEUROPATHY, FEET 10/07/2010  . PVD (peripheral vascular disease) ([place]) 01/21/2012  . RESTLESS LEG SYNDROME 10/25/2010  . Stroke [facility]) 2014, 2017   most recently in 2/17 - intracerebral hemorrhage   Past Surgical History:  Procedure Laterality Date  . AMPUTATION Right 12/13/2018   Procedure: AMPUTATION RIGHT GREAT TOE, LOCAL RELOCATION OF TISSUE FOR WOUND CLOSURE 9cm x 3cm, VAC APPLICATION;  Surgeon: [name], MD;  Location: [place];  Service: Orthopedics;  Laterality: Right;  . AMPUTATION Right 12/31/2018   Procedure: RIGHT BELOW KNEE AMPUTATION;  Surgeon: [name], MD;  Location: [place];  Service:  Orthopedics;  Laterality: Right;  . AMPUTATION Left 09/21/2019   Procedure: LEFT BELOW KNEE AMPUTATION;  Surgeon: [name], MD;  Location: [place];  Service: Orthopedics;  Laterality: Left;  . AMPUTATION TOE Left 09/09/2016   Procedure: AMPUTATION OF LEFT GREAT TOE;   Surgeon: Milly Jakob, MD;  Location: [place];  Service: Orthopedics;  Laterality: Left;  . BELOW KNEE LEG AMPUTATION Left 09/21/2019  . CARDIAC CATHETERIZATION  06/24/2018  . CORONARY ARTERY BYPASS GRAFT N/A 06/28/2018   Procedure: CORONARY ARTERY BYPASS GRAFTING (CABG) times  four, using left internal mammary artery, endoscopically harvested right saphenous vein, and harvested left radial artery;  Surgeon: [name], MD;  Location: [place];  Service: Open Heart Surgery;  Laterality: N/A;  . ENDOVEIN HARVEST OF GREATER SAPHENOUS VEIN Right 06/28/2018   Procedure: ENDOVEIN HARVEST OF GREATER SAPHENOUS VEIN;  Surgeon: [name], MD;  Location: [place];  Service: Open Heart Surgery;  Laterality: Right;  . EYE SURGERY Left 06/2017   [facility]  . LEFT HEART CATH AND CORONARY ANGIOGRAPHY N/A 06/24/2018   Procedure: LEFT HEART CATH AND CORONARY ANGIOGRAPHY;  Surgeon: Troy Sine, MD;  Location: [place] [facility];  Service: Cardiovascular;  Laterality: N/A;  . RADIAL ARTERY HARVEST Left 06/28/2018   Procedure: RADIAL ARTERY HARVEST;  Surgeon: [name], MD;  Location: [place];  Service: Open Heart Surgery;  Laterality: Left;  . TEE WITHOUT CARDIOVERSION N/A 06/28/2018   Procedure: TRANSESOPHAGEAL ECHOCARDIOGRAM (TEE);  Surgeon: [name], MD;  Location: [place];  Service: Open Heart Surgery;  Laterality: N/A;  . WISDOM TOOTH EXTRACTION     Family History  Problem Relation Age of Onset  . Arthritis Mother   . Stroke Brother        previous smoker  . Alcohol abuse Brother        in remission  . Leukemia Brother   . Diabetes Paternal Grandmother   . Healthy Son    Social History:  reports that she quit smoking about 14 years ago. She smoked 0.50 packs per day. She has never used smokeless tobacco. She reports previous drug use. She reports that she does not drink alcohol. Allergies:  Allergies  Allergen Reactions  . Penicillins Other  (See Comments)    made pt feel sick  Did it involve swelling of the face/tongue/throat, SOB, or low BP? No Did it involve sudden or severe rash/hives, skin peeling, or any reaction on the inside of your mouth or nose? No Did you need to seek medical attention at a hospital or doctor's office? No When did it last happen?April 2020 If all above answers are "NO", may proceed with cephalosporin use.   Recardo Evangelist [Pregabalin] Other (See Comments)    MADE PATIENT VERY EMOTIONAL AND WOULD CRY EASILY  . Morphine And Related Nausea And Vomiting  . Tramadol Nausea And Vomiting    Pt cant tolerate this pain med.    Medications Prior to Admission  Medication Sig Dispense Refill  . acetaminophen (TYLENOL) 325 MG tablet Take 2 tablets (650 mg total) by mouth every 6 (six) hours as needed for mild pain or headache.    Marland Kitchen amitriptyline (ELAVIL) 10 MG tablet Take 0.5 tablets (5 mg total) by mouth at bedtime. (Patient not taking: Reported on 09/16/2019) 30 tablet 0  . amLODipine (NORVASC) 5 MG tablet Take 1 tablet (5 mg total) by mouth daily. 30 tablet 1  . aspirin EC 81 MG tablet Take 81  mg by mouth daily.    Marland Kitchen atorvastatin (LIPITOR) 80 MG tablet Take 1 tablet (80 mg total) by mouth daily at 6 PM. (Patient not taking: Reported on 09/16/2019) 30 tablet 1  . azelastine (ASTELIN) 0.1 % nasal spray Place 2 sprays into both nostrils 2 (two) times daily. Use in each nostril as directed (Patient not taking: Reported on 09/16/2019) 30 mL 3  . Blood Glucose Monitoring Suppl (ONE TOUCH ULTRA 2) w/Device KIT Check blood sugars twice daily. Dx:E11.59 1 each 0  . docusate sodium (COLACE) 100 MG capsule Take 1 capsule (100 mg total) by mouth 2 (two) times daily. (Patient not taking: Reported on 09/16/2019) 10 capsule 0  . escitalopram (LEXAPRO) 10 MG tablet Take 1 tablet (10 mg total) by mouth daily. (Patient not taking: Reported on 09/16/2019) 30 tablet 0  . fluticasone (FLONASE) 50 MCG/ACT nasal spray SPRAY 2 SPRAYS INTO EACH  NOSTRIL EVERY DAY (Patient not taking: No sig reported) 16 g 6  . Insulin Glargine (LANTUS) 100 UNIT/ML Solostar Pen Inject 15 Units into the skin 2 (two) times daily. (Patient taking differently: Inject 50 Units into the skin 2 (two) times daily. ) 15 mL 5  . isosorbide mononitrate (IMDUR) 30 MG 24 hr tablet Take 0.5 tablets (15 mg total) by mouth daily. For one month then stop. (Patient not taking: Reported on 09/16/2019) 30 tablet 0  . levothyroxine (SYNTHROID) 25 MCG tablet TAKE 1 TABLET (25 MCG TOTAL) BY MOUTH DAILY BEFORE BREAKFAST. (Patient not taking: Reported on 09/16/2019) 90 tablet 1  . metoCLOPramide (REGLAN) 5 MG tablet Take 1 tablet (5 mg total) by mouth 4 (four) times daily -  before meals and at bedtime. (Patient not taking: Reported on 09/16/2019) 90 tablet 0  . Multiple Vitamin (MULTIVITAMIN WITH MINERALS) TABS tablet Take 1 tablet by mouth daily. (Patient not taking: Reported on 09/16/2019)    . ondansetron (ZOFRAN) 4 MG tablet Take 1 tablet (4 mg total) by mouth 2 (two) times daily as needed for nausea or vomiting. (Patient not taking: Reported on 09/16/2019) 40 tablet 1  . ONE TOUCH ULTRA TEST test strip USE ONCE DAILY TO CHECK BLOOD SUGAR. DX E11.9 100 each 2  . pantoprazole (PROTONIX) 40 MG tablet Take 1 tablet (40 mg total) by mouth daily. (Patient not taking: Reported on 09/16/2019) 30 tablet 0    Drug Regimen Review Drug regimen was reviewed and remains appropriate with no significant issues identified  Home: Home Living Family/patient expects to be discharged to:: Private residence Living Arrangements: Children, Other relatives Available Help at Discharge: Family, Available 24 hours/day Type of Home: House Home Access: Ramped entrance [place]: One level Bathroom Shower/Tub: Tub/shower unit, Multimedia programmer: Standard Home Equipment: [place] - single point, Environmental consultant - 2 wheels, Wheelchair - manual, Industrial/product designer History: Prior Function Level of  Independence: Independent with assistive device(s) Comments: Used W/C prior to admission due to only weight bearing through LLE was for transfers, pt does have prothesis for RLE--has only worn it a few times but never in a weight bearing position  Functional Status:  Mobility: Bed Mobility Overal bed mobility: Needs Assistance Bed Mobility: Supine to Sit Supine to sit: Supervision Sit to supine: Min guard General bed mobility comments: pt came up onto elbows without significant effort. Transfers Overall transfer level: Needs assistance Equipment used: Rolling walker (2 wheeled) Transfers: Sit to/from Stand, [employer] Transfers Sit to Stand: Mod assist, +2 physical assistance Stand pivot transfers: Mod assist, Max  assist, +2 physical assistance Anterior-Posterior transfers: Min guard, Min assist General transfer comment: Pt practiced 4 SPTs: bed to w/c mod A +2, w/c to BSC mod A +2, BSC to w/c max A +2, w/c to recliner mod A +2. Required verbal cues to scoot hips forward and for correct hand placment in prep to stand; Attempted straight sit to stand from the bed with RW and heavy mod assist, and pt was unable to get to fully extended R knee for standing Wheelchair Mobility Wheelchair mobility: Yes Wheelchair propulsion: Both upper extremities Distance: in room management, including turns Wheelchair Assistance Details (indicate cue type and reason): Managed in room propulsion, turns, and brakes well  ADL: ADL Overall ADL's : Needs assistance/impaired Eating/Feeding: Independent, Sitting Eating/Feeding Details (indicate cue type and reason): EOB Grooming: Wash/dry hands, Wash/dry face, Set up, Supervision/safety, Sitting Grooming Details (indicate cue type and reason): EOB Upper Body Bathing: Set up, Supervision/ safety, Sitting Upper Body Bathing Details (indicate cue type and reason): sitting EOB Lower Body Bathing: Moderate assistance, Sitting/lateral leans Upper Body Dressing  : Set up, Supervision/safety, Sitting Upper Body Dressing Details (indicate cue type and reason): EOB Lower Body Dressing: Moderate assistance, Sitting/lateral leans Toilet Transfer: Moderate assistance, Maximal assistance, +2 for physical assistance, Stand-pivot, BSC, Cueing for safety Toilet Transfer Details (indicate cue type and reason): Demonstrated to patient anterior (forward) and posterior (backward) to 3n1 from couch in pt's room (pt feels too weak to try this today) Toileting- Clothing Manipulation and Hygiene: Minimal assistance, Sitting/lateral lean Functional mobility during ADLs: Maximal assistance, Moderate assistance, +2 for physical assistance, Cueing for safety  Cognition: Cognition Overall Cognitive Status: Within Functional Limits for tasks assessed Orientation Level: Oriented X4 Cognition Arousal/Alertness: Awake/alert Behavior During Therapy: WFL for tasks assessed/performed Overall Cognitive Status: Within Functional Limits for tasks assessed General Comments: Indicated she is motivated to get better  Physical Exam: Blood pressure (!) 155/97, pulse 92, temperature 98.2 F (36.8 C), resp. rate 19, height '5\' 2"'$  (1.575 m), weight 59.9 kg, last menstrual period 02/12/2011, SpO2 100 %. General: Alert and oriented x 3, No apparent distress HEENT: Head is normocephalic, atraumatic, PERRLA, EOMI, sclera anicteric, oral mucosa pink and moist, dentition intact, ext ear canals clear,  Neck: Supple without JVD or lymphadenopathy Heart: Reg rate and rhythm. No murmurs rubs or gallops Chest: CTA bilaterally without wheezes, rales, or rhonchi; no distress Abdomen: Soft, non-tender, non-distended, bowel sounds positive. Extremities: No clubbing, cyanosis, or edema. Pulses are 2+ Neurological: Patient is alert no acute distress.  Oriented x3 and follows commands.  Skin: Left BKA with limb guard as well as a wound VAC in place.  Right BKA is well-healed Psych: Pt's affect is  appropriate. Pt is cooperative    Results for orders placed or performed during the hospital encounter of 09/27/19 (from the past 48 hour(s))  Glucose, capillary     Status: Abnormal   Collection Time: 09/27/19  5:13 PM  Result Value Ref Range   Glucose-Capillary 157 (H) 70 - 99 mg/dL   No results found.  Medical Problem List and Plan: 1.  Decreased functional mobility secondary to left transtibial amputation 09/21/2019 with wound VAC as directed as well as history of right BKA 12/31/2018.  Patient does have a right prosthesis.  -patient may shower but wound vac must be covered.   -ELOS/Goals: 10-14 days, modI in PT, OT, and I in SLP 2.  Antithrombotics: -DVT/anticoagulation: Patient has bilateral BKA  -antiplatelet therapy: Aspirin 81 mg daily 3. Pain Management: Oxycodone and  Robaxin as needed 4. Mood: Provide emotional support  -antipsychotic agents: N/A 5. Neuropsych: This patient is capable of making decisions on her own behalf. 6. Skin/Wound Care: Routine skin checks 7. Fluids/Electrolytes/Nutrition: Routine in and outs with follow-up chemistries 8.  CKD stage III.  Baseline creatinine 1.21-2.13.  Follow-up chemistries 9.  CAD with CABG October 2019.  Continue aspirin therapy 10.  Acute on chronic diastolic congestive heart failure.  Monitor for any signs of fluid overload 11.  History of intracerebral hemorrhage February 2017.  Follow-up outpatient 12.  Diabetes mellitus with peripheral neuropathy.  Hemoglobin A1c 14.1.  SSI.  Patient on Lantus insulin 50 units twice daily prior to admission.  Resume as needed 13.  Hypertension.  Norvasc 5 mg daily. Systolics in last 2 days ranging from 141 to 171. Consider increasing Norvasc to '10mg'$  daily.  14.  Hyperlipidemia.  Lipitor 15.  Constipation: Docusate '100mg'$  BID. Has not yet had BM. Consider changing to Senna-docusate BID.   Lavon Paganini Angiulli, PA-C 09/27/2019   I have personally performed a face to face diagnostic evaluation,  including, but not limited to relevant history and physical exam findings, of this patient and developed relevant assessment and plan.  Additionally, I have reviewed and concur with the physician assistant's documentation above.  The patient's status has not changed. The original post admission physician evaluation remains appropriate, and any changes from the pre-admission screening or documentation from the acute chart are noted above.   Leeroy Cha, MD

## 2019-09-27 NOTE — TOC Transition Note (Signed)
Transition of Care [facility]) - CM/SW Discharge Note   Patient Details  Name: [name] MRN: [id number] Date of Birth: 16-Oct-1965  Transition of Care [facility] CM/SW Contact:  Sharin Mons, RN Phone Number: 09/27/2019, 12:32 PM   Clinical Narrative:     Patient will DC to: CIR Anticipated DC date: 09/27/2019 Family notified: pt to make family aware  Per MD patient ready for DC to CIR . RN, patient, and patient's family aware of DC plan. RN will call report prior to discharge to receiving nurse.  RNCM will sign off for now as intervention is no longer needed. Please consult [name] again if new needs arise.   Final next level of care: IP Rehab Facility Barriers to Discharge: No Barriers Identified   Patient Goals and CMS Choice     Choice offered to / list presented to : Patient  Discharge Placement                       Discharge Plan and Services                                     Social Determinants of Health (SDOH) Interventions     Readmission Risk Interventions Readmission Risk Prevention Plan 12/14/2018 07/06/2018  Transportation Screening Complete Complete  PCP or Specialist Appt within 5-7 Days - Complete  PCP or Specialist Appt within 3-5 Days Complete -  Home Care Screening - Complete  Medication Review (RN CM) - Complete  HRI or Home Care Consult Complete -  Social Work Consult for Recovery Care Planning/Counseling Complete -  Palliative Care Screening Not Applicable -  Some recent data might be hidden

## 2019-09-27 NOTE — H&P (Signed)
Physical Medicine and Rehabilitation Admission H&P    Chief complaint: Stump pain  HPI: [name] is a 54 year old right-handed female with history of anxiety, chronic kidney disease stage III, acute on chronic diastolic congestive heart failure, CAD with CABG October 2019, remote tobacco abuse 14 years ago, obesity with BMI 29.26 hypertension, intracerebral hemorrhage February 2017 secondary to hypertensive crisis, hyperlipidemia, diabetes mellitus and peripheral vascular disease.  Patient well-known to rehab services after right BKA 12/31/2018 and received inpatient rehab services 01/05/2019 to 01/15/2019 and discharged to home.  Per chart review patient lives with her children.  1 level home ramped entrance.  She does have a prosthesis for right lower extremity that she has used on a limited basis primarily using wheelchair.  Presented 09/21/2019 with gangrenous changes to left heel with significant odor and drainage.  She had been on doxycycline for 2 weeks followed by Dr. [name] Given.  No change with conservative care with increased ischemic changes and underwent left transtibial amputation 09/21/2019 per Dr. [name] Given.  Hospital course pain management with wound VAC as directed.  Therapy evaluations completed and patient was admitted for a comprehensive rehab program.  Review of Systems  Constitutional: Negative for chills and fever.  HENT: Negative for hearing loss.   Eyes: Negative for blurred vision and double vision.  Respiratory: Negative for cough and shortness of breath.   Cardiovascular: Positive for leg swelling. Negative for chest pain and palpitations.  Gastrointestinal: Positive for constipation. Negative for heartburn, nausea and vomiting.       GERD  Genitourinary: Negative for dysuria, flank pain and hematuria.  Musculoskeletal: Positive for joint pain and myalgias.  Skin: Negative for rash.  Psychiatric/Behavioral: Positive for depression. The patient has insomnia.    Anxiety  All other systems reviewed and are negative.  Past Medical History:  Diagnosis Date  . Abscess of great toe, right   . Acute on chronic diastolic CHF (congestive heart failure) ([place]) 06/24/2018  . Acute osteomyelitis of toe, left ([place]) 09/05/2016  . Amputation of right great toe ([place]) 12/22/2018  . Anxiety   . Cataract    left - surgery to remove  . Cellulitis of foot, right 12/11/2018  . COMMON MIGRAINE 10/07/2010  . Decreased visual acuity 11/10/2016  . Depression 12/18/2012  . Diabetes mellitus type II, uncontrolled ([place]) 10/07/2010   Qualifier: Diagnosis of  By: Charlett Blake MD, [name]    . Diabetic foot infection ([place]) 08/26/2016  . Diabetic infection of right foot ([place])   . Disturbance of skin sensation 10/07/2010  . Gangrene of right foot ([place])   . GERD (gastroesophageal reflux disease)   . Heart murmur   . History of kidney stones    "years ago" - passed stones  . Hyperlipidemia 12/06/2010  . Hypertension   . Hypothyroidism   . Lipoma of abdominal wall 10/05/2016  . Overweight(278.02) 12/06/2010  . PERIPHERAL NEUROPATHY, FEET 10/07/2010  . PVD (peripheral vascular disease) ([place]) 01/21/2012  . RESTLESS LEG SYNDROME 10/25/2010  . Stroke [facility]) 2014, 2017   most recently in 2/17 - intracerebral hemorrhage   Past Surgical History:  Procedure Laterality Date  . AMPUTATION Right 12/13/2018   Procedure: AMPUTATION RIGHT GREAT TOE, LOCAL RELOCATION OF TISSUE FOR WOUND CLOSURE 9cm x 3cm, VAC APPLICATION;  Surgeon: [name], MD;  Location: [place];  Service: Orthopedics;  Laterality: Right;  . AMPUTATION Right 12/31/2018   Procedure: RIGHT BELOW KNEE AMPUTATION;  Surgeon: [name], MD;  Location: [place];  Service:  Orthopedics;  Laterality: Right;  . AMPUTATION Left 09/21/2019   Procedure: LEFT BELOW KNEE AMPUTATION;  Surgeon: [name], MD;  Location: [place];  Service: Orthopedics;  Laterality: Left;  . AMPUTATION TOE Left 09/09/2016   Procedure: AMPUTATION OF LEFT GREAT TOE;   Surgeon: Milly Jakob, MD;  Location: [place];  Service: Orthopedics;  Laterality: Left;  . BELOW KNEE LEG AMPUTATION Left 09/21/2019  . CARDIAC CATHETERIZATION  06/24/2018  . CORONARY ARTERY BYPASS GRAFT N/A 06/28/2018   Procedure: CORONARY ARTERY BYPASS GRAFTING (CABG) times  four, using left internal mammary artery, endoscopically harvested right saphenous vein, and harvested left radial artery;  Surgeon: [name], MD;  Location: [place];  Service: Open Heart Surgery;  Laterality: N/A;  . ENDOVEIN HARVEST OF GREATER SAPHENOUS VEIN Right 06/28/2018   Procedure: ENDOVEIN HARVEST OF GREATER SAPHENOUS VEIN;  Surgeon: [name], MD;  Location: [place];  Service: Open Heart Surgery;  Laterality: Right;  . EYE SURGERY Left 06/2017   [facility]  . LEFT HEART CATH AND CORONARY ANGIOGRAPHY N/A 06/24/2018   Procedure: LEFT HEART CATH AND CORONARY ANGIOGRAPHY;  Surgeon: Troy Sine, MD;  Location: [place] [facility];  Service: Cardiovascular;  Laterality: N/A;  . RADIAL ARTERY HARVEST Left 06/28/2018   Procedure: RADIAL ARTERY HARVEST;  Surgeon: [name], MD;  Location: [place];  Service: Open Heart Surgery;  Laterality: Left;  . TEE WITHOUT CARDIOVERSION N/A 06/28/2018   Procedure: TRANSESOPHAGEAL ECHOCARDIOGRAM (TEE);  Surgeon: [name], MD;  Location: [place];  Service: Open Heart Surgery;  Laterality: N/A;  . WISDOM TOOTH EXTRACTION     Family History  Problem Relation Age of Onset  . Arthritis Mother   . Stroke Brother        previous smoker  . Alcohol abuse Brother        in remission  . Leukemia Brother   . Diabetes Paternal Grandmother   . Healthy Son    Social History:  reports that she quit smoking about 14 years ago. She smoked 0.50 packs per day. She has never used smokeless tobacco. She reports previous drug use. She reports that she does not drink alcohol. Allergies:  Allergies  Allergen Reactions  . Penicillins Other  (See Comments)    made pt feel sick  Did it involve swelling of the face/tongue/throat, SOB, or low BP? No Did it involve sudden or severe rash/hives, skin peeling, or any reaction on the inside of your mouth or nose? No Did you need to seek medical attention at a hospital or doctor's office? No When did it last happen?April 2020 If all above answers are "NO", may proceed with cephalosporin use.   Recardo Evangelist [Pregabalin] Other (See Comments)    MADE PATIENT VERY EMOTIONAL AND WOULD CRY EASILY  . Morphine And Related Nausea And Vomiting  . Tramadol Nausea And Vomiting    Pt cant tolerate this pain med.    Medications Prior to Admission  Medication Sig Dispense Refill  . acetaminophen (TYLENOL) 325 MG tablet Take 2 tablets (650 mg total) by mouth every 6 (six) hours as needed for mild pain or headache.    Marland Kitchen amLODipine (NORVASC) 5 MG tablet Take 1 tablet (5 mg total) by mouth daily. 30 tablet 1  . aspirin EC 81 MG tablet Take 81 mg by mouth daily.    Marland Kitchen doxycycline (VIBRA-TABS) 100 MG tablet Take 1 tablet (100 mg total) by mouth 2 (two) times daily. (Patient  taking differently: Take 100 mg by mouth daily. ) 30 tablet 0  . Insulin Glargine (LANTUS) 100 UNIT/ML Solostar Pen Inject 15 Units into the skin 2 (two) times daily. (Patient taking differently: Inject 50 Units into the skin 2 (two) times daily. ) 15 mL 5  . amitriptyline (ELAVIL) 10 MG tablet Take 0.5 tablets (5 mg total) by mouth at bedtime. (Patient not taking: Reported on 09/16/2019) 30 tablet 0  . atorvastatin (LIPITOR) 80 MG tablet Take 1 tablet (80 mg total) by mouth daily at 6 PM. (Patient not taking: Reported on 09/16/2019) 30 tablet 1  . azelastine (ASTELIN) 0.1 % nasal spray Place 2 sprays into both nostrils 2 (two) times daily. Use in each nostril as directed (Patient not taking: Reported on 09/16/2019) 30 mL 3  . Blood Glucose Monitoring Suppl (ONE TOUCH ULTRA 2) w/Device KIT Check blood sugars twice daily. Dx:E11.59 1 each 0  .  cadexomer iodine (IODOSORB) 0.9 % gel Apply 1 application topically daily. Apply to the affected area daily plus dry dressing (Patient not taking: Reported on 09/16/2019) 40 g 3  . docusate sodium (COLACE) 100 MG capsule Take 1 capsule (100 mg total) by mouth 2 (two) times daily. (Patient not taking: Reported on 09/16/2019) 10 capsule 0  . doxycycline (VIBRAMYCIN) 50 MG capsule Take 1 capsule (50 mg total) by mouth 2 (two) times daily. (Patient not taking: Reported on 09/16/2019) 60 capsule 0  . escitalopram (LEXAPRO) 10 MG tablet Take 1 tablet (10 mg total) by mouth daily. (Patient not taking: Reported on 09/16/2019) 30 tablet 0  . fluticasone (FLONASE) 50 MCG/ACT nasal spray SPRAY 2 SPRAYS INTO EACH NOSTRIL EVERY DAY (Patient not taking: No sig reported) 16 g 6  . Insulin Pen Needle (PEN NEEDLES) 31G X 5 MM MISC 1 Syringe by Does not apply route daily. To use with Lantus Pen 100 each 1  . insulin regular (HUMULIN R) 100 units/mL injection Inject 0.04 mLs (4 Units total) into the skin every evening. (Patient not taking: Reported on 09/16/2019) 10 mL 2  . isosorbide mononitrate (IMDUR) 30 MG 24 hr tablet Take 0.5 tablets (15 mg total) by mouth daily. For one month then stop. (Patient not taking: Reported on 09/16/2019) 30 tablet 0  . levothyroxine (SYNTHROID) 25 MCG tablet TAKE 1 TABLET (25 MCG TOTAL) BY MOUTH DAILY BEFORE BREAKFAST. (Patient not taking: Reported on 09/16/2019) 90 tablet 1  . metoCLOPramide (REGLAN) 5 MG tablet Take 1 tablet (5 mg total) by mouth 4 (four) times daily -  before meals and at bedtime. (Patient not taking: Reported on 09/16/2019) 90 tablet 0  . Multiple Vitamin (MULTIVITAMIN WITH MINERALS) TABS tablet Take 1 tablet by mouth daily. (Patient not taking: Reported on 09/16/2019)    . ondansetron (ZOFRAN) 4 MG tablet Take 1 tablet (4 mg total) by mouth 2 (two) times daily as needed for nausea or vomiting. (Patient not taking: Reported on 09/16/2019) 40 tablet 1  . ONE TOUCH ULTRA TEST test strip USE  ONCE DAILY TO CHECK BLOOD SUGAR. DX E11.9 100 each 2  . pantoprazole (PROTONIX) 40 MG tablet Take 1 tablet (40 mg total) by mouth daily. (Patient not taking: Reported on 09/16/2019) 30 tablet 0  . pentoxifylline (TRENTAL) 400 MG CR tablet Take 1 tablet (400 mg total) by mouth 3 (three) times daily with meals. (Patient not taking: Reported on 09/16/2019) 90 tablet 3  . silver sulfADIAZINE (SILVADENE) 1 % cream Apply 1 application topically daily. (Patient not taking: Reported on 09/16/2019)  400 g 0    Drug Regimen Review Drug regimen was reviewed and remains appropriate with no significant issues identified  Home: Home Living Family/patient expects to be discharged to:: Private residence Living Arrangements: Children, Other relatives Available Help at Discharge: Family, Available 24 hours/day Type of Home: House Home Access: Ramped entrance Home Layout: One level Bathroom Shower/Tub: Tub/shower unit, Multimedia programmer: Standard Home Equipment: Cane - single point, Environmental consultant - 2 wheels, Wheelchair - manual, Industrial/product designer History: Prior Function Level of Independence: Independent with assistive device(s) Comments: Used W/C prior to admission due to only weight bearing through LLE was for transfers, pt does have prothesis for RLE--has only worn it a few times but never in a weight bearing position  Functional Status:  Mobility: Bed Mobility Overal bed mobility: Needs Assistance Bed Mobility: Supine to Sit Supine to sit: Supervision Sit to supine: Min guard General bed mobility comments: pt came up onto elbows without significant effort. Transfers Overall transfer level: Needs assistance Equipment used: Rolling walker (2 wheeled) Transfers: Sit to/from Stand, [employer] Transfers Sit to Stand: Mod assist, +2 physical assistance Stand pivot transfers: Mod assist, Max assist, +2 physical assistance Anterior-Posterior transfers: Min guard, Min assist General transfer  comment: Pt practiced 4 SPTs: bed to w/c mod A +2, w/c to BSC mod A +2, BSC to w/c max A +2, w/c to recliner mod A +2. Required verbal cues to scoot hips forward and for correct hand placment in prep to stand; Attempted straight sit to stand from the bed with RW and heavy mod assist, and pt was unable to get to fully extended R knee for standing   Wheelchair Mobility Wheelchair mobility: Yes Wheelchair propulsion: Both upper extremities Distance: in room management, including turns Wheelchair Assistance Details (indicate cue type and reason): Managed in room propulsion, turns, and brakes well  ADL: ADL Overall ADL's : Needs assistance/impaired Eating/Feeding: Independent, Sitting Eating/Feeding Details (indicate cue type and reason): EOB Grooming: Wash/dry hands, Wash/dry face, Set up, Supervision/safety, Sitting Grooming Details (indicate cue type and reason): EOB Upper Body Bathing: Set up, Supervision/ safety, Sitting Upper Body Bathing Details (indicate cue type and reason): sitting EOB Lower Body Bathing: Moderate assistance, Sitting/lateral leans Upper Body Dressing : Set up, Supervision/safety, Sitting Upper Body Dressing Details (indicate cue type and reason): EOB Lower Body Dressing: Moderate assistance, Sitting/lateral leans Toilet Transfer: Moderate assistance, Maximal assistance, +2 for physical assistance, Stand-pivot, BSC, Cueing for safety Toilet Transfer Details (indicate cue type and reason): Demonstrated to patient anterior (forward) and posterior (backward) to 3n1 from couch in pt's room (pt feels too weak to try this today) Toileting- Clothing Manipulation and Hygiene: Minimal assistance, Sitting/lateral lean Functional mobility during ADLs: Maximal assistance, Moderate assistance, +2 for physical assistance, Cueing for safety  Cognition: Cognition Overall Cognitive Status: Within Functional Limits for tasks assessed Orientation Level: Oriented  X4 Cognition Arousal/Alertness: Awake/alert Behavior During Therapy: WFL for tasks assessed/performed Overall Cognitive Status: Within Functional Limits for tasks assessed General Comments: Indicated she is motivated to get better  Physical Exam: Blood pressure (!) 145/82, pulse 73, temperature 98.2 F (36.8 C), resp. rate 17, height 5' 2" (1.575 m), weight 72.6 kg, last menstrual period 02/12/2011, SpO2 99 %.  Physical Exam  General: Alert and oriented x 3, No apparent distress HEENT: Head is normocephalic, atraumatic, PERRLA, EOMI, sclera anicteric, oral mucosa pink and moist, dentition intact, ext ear canals clear,  Neck: Supple without JVD or lymphadenopathy Heart: Reg rate and rhythm. No  murmurs rubs or gallops Chest: CTA bilaterally without wheezes, rales, or rhonchi; no distress Abdomen: Soft, non-tender, non-distended, bowel sounds positive. Extremities: No clubbing, cyanosis, or edema. Pulses are 2+ Neurological: Patient is alert no acute distress.  Oriented x3 and follows commands.  Skin: Left BKA with limb guard as well as a wound VAC in place.  Right BKA is well-healed Psych: Pt's affect is appropriate. Pt is cooperative    Results for orders placed or performed during the hospital encounter of 09/21/19 (from the past 48 hour(s))  Glucose, capillary     Status: Abnormal   Collection Time: 09/25/19  6:43 AM  Result Value Ref Range   Glucose-Capillary 102 (H) 70 - 99 mg/dL   Comment 1 Document in Chart   Glucose, capillary     Status: Abnormal   Collection Time: 09/25/19 12:07 PM  Result Value Ref Range   Glucose-Capillary 112 (H) 70 - 99 mg/dL  Glucose, capillary     Status: Abnormal   Collection Time: 09/25/19  5:13 PM  Result Value Ref Range   Glucose-Capillary 260 (H) 70 - 99 mg/dL  Glucose, capillary     Status: Abnormal   Collection Time: 09/25/19  9:24 PM  Result Value Ref Range   Glucose-Capillary 162 (H) 70 - 99 mg/dL   Comment 1 Document in Chart    Glucose, capillary     Status: Abnormal   Collection Time: 09/26/19  6:39 AM  Result Value Ref Range   Glucose-Capillary 125 (H) 70 - 99 mg/dL   Comment 1 Document in Chart   Glucose, capillary     Status: Abnormal   Collection Time: 09/26/19 11:04 AM  Result Value Ref Range   Glucose-Capillary 162 (H) 70 - 99 mg/dL  Glucose, capillary     Status: Abnormal   Collection Time: 09/26/19  5:30 PM  Result Value Ref Range   Glucose-Capillary 158 (H) 70 - 99 mg/dL  Glucose, capillary     Status: Abnormal   Collection Time: 09/26/19  9:00 PM  Result Value Ref Range   Glucose-Capillary 173 (H) 70 - 99 mg/dL   No results found.  Medical Problem List and Plan: 1.  Decreased functional mobility secondary to left transtibial amputation 09/21/2019 with wound VAC as directed as well as history of right BKA 12/31/2018.  Patient does have a right prosthesis.  -patient may shower but wound vac must be covered.   -ELOS/Goals: 10-14 days, modI in PT, OT, and I in SLP 2.  Antithrombotics: -DVT/anticoagulation: Patient has bilateral BKA  -antiplatelet therapy: Aspirin 81 mg daily 3. Pain Management: Oxycodone and Robaxin as needed 4. Mood: Provide emotional support  -antipsychotic agents: N/A 5. Neuropsych: This patient is capable of making decisions on her own behalf. 6. Skin/Wound Care: Routine skin checks 7. Fluids/Electrolytes/Nutrition: Routine in and outs with follow-up chemistries 8.  CKD stage III.  Baseline creatinine 1.21-2.13.  Follow-up chemistries 9.  CAD with CABG October 2019.  Continue aspirin therapy 10.  Acute on chronic diastolic congestive heart failure.  Monitor for any signs of fluid overload 11.  History of intracerebral hemorrhage February 2017.  Follow-up outpatient 12.  Diabetes mellitus with peripheral neuropathy.  Hemoglobin A1c 14.1.  SSI.  Patient on Lantus insulin 50 units twice daily prior to admission.  Resume as needed 13.  Hypertension.  Norvasc 5 mg daily.  Systolics in last 2 days ranging from 141 to 171. Consider increasing Norvasc to 90m daily.  14.  Hyperlipidemia.  Lipitor 15.  Constipation: Docusate 119m BID. Has not yet had BM. Consider changing to Senna-docusate BID.   DLavon PaganiniAngiulli, PA-C 09/27/2019   I have personally performed a face to face diagnostic evaluation, including, but not limited to relevant history and physical exam findings, of this patient and developed relevant assessment and plan.  Additionally, I have reviewed and concur with the physician assistant's documentation above.  KLeeroy Cha MD

## 2019-09-27 NOTE — Progress Notes (Signed)
Patient arrived to 4M13, A&Ox4. No s/s of distress noted. No complications noted at this time. Audie Clear, LPN

## 2019-09-27 NOTE — Plan of Care (Signed)
  Problem: Education: Goal: Knowledge of General Education information will improve Description: Including pain rating scale, medication(s)/side effects and non-pharmacologic comfort measures Outcome: Progressing   Problem: Education: Goal: Knowledge of General Education information will improve Description: Including pain rating scale, medication(s)/side effects and non-pharmacologic comfort measures Outcome: Progressing   Problem: Health Behavior/Discharge Planning: Goal: Ability to manage health-related needs will improve Outcome: Progressing   Problem: Nutrition: Goal: Adequate nutrition will be maintained Outcome: Progressing   Problem: Coping: Goal: Level of anxiety will decrease Outcome: Progressing   Problem: Pain Managment: Goal: General experience of comfort will improve Outcome: Progressing   Problem: Safety: Goal: Ability to remain free from injury will improve Outcome: Progressing   Problem: Skin Integrity: Goal: Risk for impaired skin integrity will decrease Outcome: Progressing

## 2019-09-27 NOTE — Progress Notes (Signed)
Inpatient Rehabilitation-Admissions Coordinator   I have insurance approval and medical approval from attending service for admit to CIR today. AC has confirmed DC support from her son and [facility] has reviewed insurance letter and consent form with pt. RN and [facility] team notified of plan for today.   Please call if questions.   Raechel Ache, OTR/L  Rehab Admissions Coordinator  [phone] 09/27/2019 12:05 PM

## 2019-09-27 NOTE — Progress Notes (Signed)
S/p Left BKA. History of Right BKA. 0cc in [facility]. Patient has been accepted by CIR pending insurance approval and bed availability.  Will dc when this occurs. Discussed with patient need for glucose control for healing of wound Appreciate input of Diabetes nurse educator

## 2019-09-27 NOTE — Progress Notes (Signed)
Physical Therapy Treatment Patient Details Name: [name] MRN: [id number] DOB: 03/20/1966 Today's Date: 09/27/2019    History of Present Illness Pt is a 54 yo female s/p L transtibial amputation on 09/21/2019, PHMx: R BKA 12/31/18, CHF, anxiety, migraine, depression, DM, PVD, stroke    PT Comments    Continuing work on functional mobility and activity tolerance;  Session focused on therex for hip extension and abduction strengthening and hip flexor and hamstring stretching; Painful to get into prone-lying position, but once there tolerated well; noted plans for transfer to CIR   Follow Up Recommendations  CIR     Equipment Recommendations  None recommended by PT    Recommendations for Other Services Rehab consult     Precautions / Restrictions Precautions Precautions: Fall Precaution Comments: wound vac Required Braces or Orthoses: (residual limb protector for LLE) Restrictions LLE Weight Bearing: Non weight bearing    Mobility  Bed Mobility Overal bed mobility: Needs Assistance Bed Mobility: Rolling;Supine to Sit;Sit to Supine Rolling: Supervision(and assist for VAC line)   Supine to sit: Supervision Sit to supine: Supervision   General bed mobility comments: pt came up onto elbows without significant effort; Little difficulty coming to sit and then laying back down; Noting incr pain L Residual limb as she positioned herself to roll inot and out of prone positioning  Transfers                    Ambulation/Gait                 Stairs             Wheelchair Mobility    Modified Rankin (Stroke Patients Only)       Balance                                            Cognition Arousal/Alertness: Awake/alert Behavior During Therapy: WFL for tasks assessed/performed Overall Cognitive Status: Within Functional Limits for tasks assessed                                        Exercises Amputee  Exercises Quad Sets: AROM;Strengthening;Left;10 reps;Supine Hip Extension: AROM;Right;Left;20 reps;Prone(alternating) Hip ABduction/ADduction: AROM;Strengthening;Left;10 reps;Sidelying Knee Flexion: AROM;Both;15 reps;Seated Knee Extension: AROM;Both;15 reps;Seated Straight Leg Raises: AROM;Both;10 reps;Supine Other Exercises Other Exercises: Bolstered bridging x 10 Other Exercises: R and L hamstring stretches in sitting with 15 second hold; needed lots of verbal and tactile cues for form Other Exercises: L hip extension in R sidelying x10    General Comments        Pertinent Vitals/Pain Pain Assessment: Faces Faces Pain Scale: Hurts little more Pain Location: L residual limb Pain Descriptors / Indicators: Aching;Throbbing;Sore Pain Intervention(s): Monitored during session    Home Living                      Prior Function            PT Goals (current goals can now be found in the care plan section) Acute Rehab PT Goals Patient Stated Goal: to go to rehab to learn how to use RLE prothesis and to get back to her Mod I level PT Goal Formulation: With patient Time For Goal Achievement: 10/06/19 Potential to Achieve Goals: Good Progress  towards PT goals: Progressing toward goals    Frequency    Min 5X/week      PT Plan Current plan remains appropriate    Co-evaluation              AM-PAC PT "6 Clicks" Mobility   Outcome Measure  Help needed turning from your back to your side while in a flat bed without using bedrails?: None Help needed moving from lying on your back to sitting on the side of a flat bed without using bedrails?: None Help needed moving to and from a bed to a chair (including a wheelchair)?: A Little Help needed standing up from a chair using your arms (e.g., wheelchair or bedside chair)?: A Lot Help needed to walk in hospital room?: Total Help needed climbing 3-5 steps with a railing? : Total 6 Click Score: 15    End of Session    Activity Tolerance: Patient tolerated treatment well Patient left: in bed;with call bell/phone within reach Nurse Communication: Mobility status PT Visit Diagnosis: Other abnormalities of gait and mobility (R26.89);Pain Pain - Right/Left: Left Pain - part of body: Leg     Time: [id number] PT Time Calculation (min) (ACUTE ONLY): 19 min  Charges:  $Therapeutic Exercise: 8-22 mins                     [name], PT  Acute Rehabilitation Services Pager [phone] Office [place] 09/27/2019, 1:51 PM

## 2019-09-27 NOTE — Progress Notes (Signed)
Tiffany Ribas, MD  Physician  Physical Medicine and Rehabilitation  PMR Pre-admission  Signed  Date of Service:  09/26/2019  6:27 PM      Related encounter: Admission (Discharged) from 09/21/2019 in [facility] [place]        PMR Admission Coordinator Pre-Admission Assessment   Patient: [name] is an 54 y.o., female MRN: [id number] DOB: 05-09-1966 Height: 5' 2"  (157.5 cm) Weight: 72.6 kg   Insurance Information HMO:     PPO: yes     PCP:      IPA:      80/20:      OTHER: PRIMARYDelon [place]      Policy#: [id number]      Subscriber: Patient CM Name: Derald Macleod      Phone#: [phone]     Fax#: [phone] Pre-Cert#: [id number]      Employer:  [employer]auf[employer] provided by Derald Macleod for admit to CIR. Pt is approved for 7 days with admission 1/19 through 1/25 under CASE [id number]. Concurrent review is due to 1/25 as a verbal update by case manger by calling (p): [phone] and giving nurse verbal clinicals-confirming DC home set up details.  Benefits:  Phone #: [phone]     Name:  Eff. Date: 08/26-2019-10/10/2019     Deduct: $1,750 ($81.29 met)      Out of Pocket Max: $3,000 (includes deductible - $105.20 met)      Life Max:  CIR: 80% coverage, 20% co-insurance      SNF: 80% coverage, 20% co-insurance; limited by necessity reviewed through [facility]: 80% coverage, 20% co-insurance; limited to 90 visits/service period combined all therapies PT/OT/ST   Home Health: 80% coverage, 20% co-insurance; limited to 100 visits/service period      DME: 80% coverage     Co-Pay: 20% co-insurance Providers:    *Pt is aware her insurance terminates on 10/10/19 and if she remains in IP Rehab by 2/1 she will become self pay.   SECONDARY: None      Policy#:       Subscriber:  CM Name:       Phone#:      Fax#:  Pre-Cert#:       Employer:  Benefits:  Phone #:      Name:  Eff. Date:      Deduct:       Out of Pocket  Max:       Life Max:  CIR:       SNF:  Outpatient:      Co-Pay: Home Health:       Co-Pay:  DME:     Co-Pay:   Medicaid Application Date:       Case Manager:  Disability Application Date:       Case Worker:    The "Data Collection Information Summary" for patients in Inpatient Rehabilitation Facilities with attached "Privacy Act [place] Records" was provided and verbally reviewed with: N/A   Emergency Contact Information         Contact Information     Name Relation Home Work [address]    [name] Son [phone]             Current Medical History  Patient Admitting Diagnosis: New Left BKA with history of R BKA   History of Present Illness: [name] is a 54 year old female with history of anxiety, chronic kidney disease stage III, acute on chronic  diastolic congestive heart failure, CAD with CABG October 2019, remote tobacco abuse 14 years ago, obesity with BMI 29.26 hypertension, intracerebral hemorrhage February 2017 secondary to hypertensive crisis, hyperlipidemia, diabetes mellitus and peripheral vascular disease.  Patient well-known to rehab services after right BKA 12/31/2018 and received inpatient rehab services 01/05/2019 to 01/15/2019 and discharged to home. She does have a prosthesis for right lower extremity that she has used on a limited basis primarily using wheelchair.  Presented 09/21/2019 with gangrenous changes to left heel with significant odor and drainage.  She had been on doxycycline for 2 weeks followed by Dr. [name] Given.  No change with conservative care with increased ischemic changes and underwent left transtibial amputation 09/21/2019 per Dr. [name] Given.  Hospital course pain management with wound VAC as directed.  Therapy evaluations completed and patient was admitted for a comprehensive rehab program.   Patient's medical record from [facility] has been reviewed by the rehabilitation admission coordinator and physician.   Past Medical  History      Past Medical History:  Diagnosis Date  . Abscess of great toe, right    . Acute on chronic diastolic CHF (congestive heart failure) ([place]) 06/24/2018  . Acute osteomyelitis of toe, left ([place]) 09/05/2016  . Amputation of right great toe ([place]) 12/22/2018  . Anxiety    . Cataract      left - surgery to remove  . Cellulitis of foot, right 12/11/2018  . COMMON MIGRAINE 10/07/2010  . Decreased visual acuity 11/10/2016  . Depression 12/18/2012  . Diabetes mellitus type II, uncontrolled ([place]) 10/07/2010    Qualifier: Diagnosis of  By: Charlett Blake MD, [name]    . Diabetic foot infection ([place]) 08/26/2016  . Diabetic infection of right foot ([place])    . Disturbance of skin sensation 10/07/2010  . Gangrene of right foot ([place])    . GERD (gastroesophageal reflux disease)    . Heart murmur    . History of kidney stones      "years ago" - passed stones  . Hyperlipidemia 12/06/2010  . Hypertension    . Hypothyroidism    . Lipoma of abdominal wall 10/05/2016  . Overweight(278.02) 12/06/2010  . PERIPHERAL NEUROPATHY, FEET 10/07/2010  . PVD (peripheral vascular disease) ([place]) 01/21/2012  . RESTLESS LEG SYNDROME 10/25/2010  . Stroke [facility]) 2014, 2017    most recently in 2/17 - intracerebral hemorrhage      Family History   family history includes Alcohol abuse in her brother; Arthritis in her mother; Diabetes in her paternal grandmother; Healthy in her son; Leukemia in her brother; Stroke in her brother.   Prior Rehab/Hospitalizations Has the patient had prior rehab or hospitalizations prior to admission? Yes   Has the patient had major surgery during 100 days prior to admission? Yes              Current Medications   Current Facility-Administered Medications:  .  0.9 %  sodium chloride infusion, , Intravenous, Continuous, Persons, Bevely Palmer, [place], Last Rate: 75 mL/hr at 09/22/19 1256, New Bag at 09/22/19 1256 .  acetaminophen (TYLENOL) tablet 325-650 mg, 325-650 mg, Oral, Q6H PRN, Persons, Bevely Palmer,  [place], 650 mg at 09/25/19 0923 .  amLODipine (NORVASC) tablet 5 mg, 5 mg, Oral, Daily, Persons, Bevely Palmer, [place], 5 mg at 09/27/19 0844 .  aspirin EC tablet 81 mg, 81 mg, Oral, Daily, Persons, Bevely Palmer, [place], 81 mg at 09/27/19 0843 .  docusate sodium (COLACE) capsule 100 mg, 100 mg, Oral, BID, Persons,  Bevely Palmer, [place], 100 mg at 09/27/19 0844 .  HYDROmorphone (DILAUDID) injection 0.5 mg, 0.5 mg, Intravenous, Q4H PRN, Persons, Bevely Palmer, PA, 0.5 mg at 09/24/19 0059 .  insulin aspart (novoLOG) injection 0-15 Units, 0-15 Units, Subcutaneous, TID WC, Persons, Bevely Palmer, [place], 3 Units at 09/27/19 [phone] .  lactated ringers infusion, , Intravenous, Continuous, Lynda Rainwater, MD, Last Rate: 10 mL/hr at 09/21/19 1223, Restarted at 09/21/19 1319 .  lactated ringers infusion, , Intravenous, Continuous, Sabra Heck, Carles Collet, MD .  methocarbamol (ROBAXIN) tablet 500 mg, 500 mg, Oral, Q6H PRN, 500 mg at 09/27/19 1130 **OR** methocarbamol (ROBAXIN) 500 mg in dextrose 5 % 50 mL IVPB, 500 mg, Intravenous, Q6H PRN, Persons, Bevely Palmer, PA .  metoCLOPramide (REGLAN) tablet 5-10 mg, 5-10 mg, Oral, Q8H PRN **OR** metoCLOPramide (REGLAN) injection 5-10 mg, 5-10 mg, Intravenous, Q8H PRN, Persons, Bevely Palmer, PA .  ondansetron (ZOFRAN) tablet 4 mg, 4 mg, Oral, Q6H PRN **OR** ondansetron (ZOFRAN) injection 4 mg, 4 mg, Intravenous, Q6H PRN, Persons, Bevely Palmer, PA .  oxyCODONE (Oxy IR/ROXICODONE) immediate release tablet 5-10 mg, 5-10 mg, Oral, Q4H PRN, Persons, Bevely Palmer, PA, 10 mg at 09/27/19 0844   Patients Current Diet:     Diet Order                      Diet - low sodium heart healthy           Diet Carb Modified Fluid consistency: Thin; Room service appropriate? Yes  Diet effective now                   Precautions / Restrictions Precautions Precautions: Fall Precaution Comments: wound vac Restrictions Weight Bearing Restrictions: Yes LLE Weight Bearing: Non weight bearing    Has the patient had 2 or more falls  or a fall with injury in the past year? No   Prior Activity Level Household: did not get out of house much; used wc for mobility inside home. did not work or drive but was independent in ADLs and transfers.    Prior Functional Level Self Care: Did the patient need help bathing, dressing, using the toilet or eating? Independent   Indoor Mobility: Did the patient need assistance with walking from room to room (with or without device)? Independent   Stairs: Did the patient need assistance with internal or external stairs (with or without device)? Dependent   Functional Cognition: Did the patient need help planning regular tasks such as shopping or remembering to take medications? [place] / Equipment Home Assistive Devices/Equipment: Wheelchair, Environmental consultant (specify type), Dentures (specify type), Eyeglasses, CBG Meter, Prosthesis Home Equipment: Cane - single point, Walker - 2 wheels, Wheelchair - manual, Shower seat   Prior Device Use: Indicate devices/aids used by the patient prior to current illness, exacerbation or injury? Manual wheelchair and RLE prosthetic   Current Functional Level Cognition   Overall Cognitive Status: Within Functional Limits for tasks assessed Orientation Level: Oriented X4 General Comments: Indicated she is motivated to get better    Extremity Assessment (includes Sensation/Coordination)   Upper Extremity Assessment: Defer to OT evaluation, Overall WFL for tasks assessed  Lower Extremity Assessment: LLE deficits/detail LLE Deficits / Details: pt with decreased strength and ROM limitations secondary to post-op pain and weakness. Limb protector donned throughout LLE: Unable to fully assess due to pain     ADLs   Overall ADL's : Needs assistance/impaired Eating/Feeding: Independent, Sitting Eating/Feeding Details (indicate cue  type and reason): EOB Grooming: Wash/dry hands, Wash/dry face, Set up, Supervision/safety, Sitting Grooming  Details (indicate cue type and reason): EOB Upper Body Bathing: Set up, Supervision/ safety, Sitting Upper Body Bathing Details (indicate cue type and reason): sitting EOB Lower Body Bathing: Moderate assistance, Sitting/lateral leans Upper Body Dressing : Set up, Supervision/safety, Sitting Upper Body Dressing Details (indicate cue type and reason): EOB Lower Body Dressing: Moderate assistance, Sitting/lateral leans Toilet Transfer: Moderate assistance, Maximal assistance, +2 for physical assistance, Stand-pivot, BSC, Cueing for safety Toilet Transfer Details (indicate cue type and reason): Demonstrated to patient anterior (forward) and posterior (backward) to 3n1 from couch in pt's room (pt feels too weak to try this today) Toileting- Clothing Manipulation and Hygiene: Minimal assistance, Sitting/lateral lean Functional mobility during ADLs: Maximal assistance, Moderate assistance, +2 for physical assistance, Cueing for safety     Mobility   Overal bed mobility: Needs Assistance Bed Mobility: Supine to Sit Supine to sit: Supervision Sit to supine: Min guard General bed mobility comments: pt came up onto elbows without significant effort.     Transfers   Overall transfer level: Needs assistance Equipment used: Rolling walker (2 wheeled) Transfers: Sit to/from Stand, [employer] Transfers Sit to Stand: Mod assist, +2 physical assistance Stand pivot transfers: Mod assist, Max assist, +2 physical assistance Anterior-Posterior transfers: Min guard, Min assist General transfer comment: Pt practiced 4 SPTs: bed to w/c mod A +2, w/c to BSC mod A +2, BSC to w/c max A +2, w/c to recliner mod A +2. Required verbal cues to scoot hips forward and for correct hand placment in prep to stand; Attempted straight sit to stand from the bed with RW and heavy mod assist, and pt was unable to get to fully extended R knee for standing     Ambulation / Gait / Stairs / Dentist mobility: Yes Wheelchair propulsion: Both upper extremities Distance: in room management, including turns Wheelchair Assistance Details (indicate cue type and reason): Managed in room propulsion, turns, and brakes well     Posture / Balance Dynamic Sitting Balance Sitting balance - Comments: pt able to maintain upright sitting at EOB with supervision for safety, including while donning prosthesis Balance Overall balance assessment: Needs assistance Sitting-balance support: No upper extremity supported Sitting balance-Leahy Scale: Fair Sitting balance - Comments: pt able to maintain upright sitting at EOB with supervision for safety, including while donning prosthesis Standing balance support: Bilateral upper extremity supported, During functional activity Standing balance-Leahy Scale: Poor Standing balance comment: pt now has R prosthesis     Special needs/care consideration BiPAP/CPAP : no CPM : no Continuous Drip IV: 0.9% sodium chloride infusion, lactated ringers infusion Dialysis : no        Days : no Life Vest : no Oxygen : no Special Bed : no Trach Size : no Wound Vac (area) : no      Location : no Skin: surgical incision L BKA, old R BKA; negative pressure wound therapy left leg.  Bowel mgmt: last BM: 09/21/19 Bladder mgmt: continent, external catheter in place Diabetic mgmt: yes Behavioral consideration : no Chemo/radiation : no    Previous Home Environment (from acute therapy documentation) Living Arrangements: Children, Other relatives Available Help at Discharge: Family, Available 24 hours/day Type of Home: House Home Layout: One level Home Access: Ramped entrance Bathroom Shower/Tub: Tub/shower unit, Multimedia programmer: [place]: Yes Type of Home Care Services: Home RN, [place] (  if known): [facility]   Discharge Living Setting Plans for Discharge Living Setting: Patient's home, Lives with  (comment)(son and daughter in law) Type of Home at Discharge: House Discharge Home Layout: Two level, Full bath on main level, Able to live on main level with bedroom/bathroom Alternate Level Stairs-Rails: None Alternate Level Stairs-Number of Steps: NA Discharge Home Access: Ramped entrance Discharge Bathroom Shower/Tub: Walk-in shower(but wc cannot fit into bathroom; does bird baths on [facility]) Discharge Bathroom Toilet: Handicapped height Discharge Bathroom Accessibility: No Does the patient have any problems obtaining your medications?: No   Social/Family/Support Systems Patient Roles: Other (Comment)(lives with son and daugther in law) Contact Information: [name] (son): [phone] Anticipated Caregiver: son and daughter in law Anticipated Caregiver's Contact Information: see above Ability/Limitations of Caregiver: supervision/Min A Caregiver Availability: 24/7 Discharge Plan Discussed with Primary Caregiver: Yes(pt's son [name]) Is Caregiver In Agreement with Plan?: Yes Does Caregiver/Family have Issues with Lodging/Transportation while Pt is in Rehab?: No   Goals/Additional Needs Patient/Family Goal for Rehab: PT/OT: Supervision; SLP: NA Expected length of stay: 7-10 days  Cultural Considerations: TBD Dietary Needs: carb modified, thin liquids. Calorie level 1600-2000.  Equipment Needs: TBD Additional Information: pt has RLE prosthetic in room  Pt/Family Agrees to Admission and willing to participate: Yes Program Orientation Provided & Reviewed with Pt/Caregiver Including Roles  & Responsibilities: Yes(pt and son )  Barriers to Discharge: Home environment access/layout, Insurance for SNF coverage  Barriers to Discharge Comments: bathroom not condusive to wc accessibility.    Decrease burden of Care through IP rehab admission: Other    Possible need for SNF placement upon discharge: Not anticipated. Pt had good social support at DC and an accessible home for wc entry. Confirmed  with pt's son that he or his wife can be present at DC with pt and provide Min A as needed.    Patient Condition: I have reviewed medical records from [facility], spoken with PA, and patient and pt's son [name]. I met with patient at the bedside for inpatient rehabilitation assessment.  Patient will benefit from ongoing PT and OT, can actively participate in 3 hours of therapy a day 5 days of the week, and can make measurable gains during the admission.  Patient will also benefit from the coordinated team approach during an Inpatient Acute Rehabilitation admission.  The patient will receive intensive therapy as well as Rehabilitation physician, nursing, social worker, and care management interventions.  Due to safety, skin/wound care, disease management, medication administration, pain management and patient education the patient requires 24 hour a day rehabilitation nursing.  The patient is currently Mod A/Max A + 2 for stand pivot transfers and Supervision to Max A +2 for basic ADLs.  Discharge setting and therapy post discharge at home with home health is anticipated.  Patient has agreed to participate in the Acute Inpatient Rehabilitation Program and will admit 09/27/19.   Preadmission Screen Completed By:  Raechel Ache, 09/27/2019 12:21 PM ______________________________________________________________________   Discussed status with Dr. Ranell Patrick on 09/27/19 at 12:20PM and received approval for admission today.   Admission Coordinator:  Raechel Ache, OT, time 12:20PM/Date 09/26/18    Assessment/Plan: Diagnosis: Left transtibial amputation 1. Does the need for close, 24 hr/day Medical supervision in concert with the patient's rehab needs make it unreasonable for this patient to be served in a less intensive setting? Yes 2. Co-Morbidities requiring supervision/potential complications: anemia of chronic disease, migraine without aura, hereditary and idiopathic peripheral neuropathy,  essential HTN, heart murmur 3.  Due to bladder management, bowel management, safety, skin/wound care, disease management, medication administration, pain management and patient education, does the patient require 24 hr/day rehab nursing? Yes 4. Does the patient require coordinated care of a physician, rehab nurse, PT, OT to address physical and functional deficits in the context of the above medical diagnosis(es)? Yes Addressing deficits in the following areas: balance, endurance, locomotion, strength, transferring, bowel/bladder control, bathing, dressing, feeding, grooming, toileting and psychosocial support 5. Can the patient actively participate in an intensive therapy program of at least 3 hrs of therapy 5 days a week? Yes 6. The potential for patient to make measurable gains while on inpatient rehab is excellent 7. Anticipated functional outcomes upon discharge from inpatient rehab: modified independent PT, modified independent OT, independent SLP 8. Estimated rehab length of stay to reach the above functional goals is: 10-14 days 9. Anticipated discharge destination: Home 10. Overall Rehab/Functional Prognosis: excellent     MD Signature: Leeroy Cha, MD        Revision History Date/Time User Provider Type Action  09/27/2019  1:04 PM Ranell Patrick, Clide Deutscher, MD Physician Sign  09/27/2019 12:38 PM Raechel Ache, [place] [facility] Admission Coordinator Share   View Details Report

## 2019-09-27 NOTE — Discharge Summary (Signed)
Discharge Diagnoses:  Active Problems:   Subacute osteomyelitis, left ankle and foot ([name])   Acute osteomyelitis of left foot ([name])   Surgeries: Procedure(s): LEFT BELOW KNEE AMPUTATION on 09/21/2019    Consultants:   Discharged Condition: Improved  Hospital Course: [name] is an 54 y.o. female who was admitted 09/21/2019 with a chief complaint of left foot osteomyelitis, with a final diagnosis of Gangrene Left Heel.  Patient was brought to the operating room on 09/21/2019 and underwent Procedure(s): LEFT BELOW KNEE AMPUTATION.    Patient was given perioperative antibiotics:  Anti-infectives (From admission, onward)   Start     Dose/Rate Route Frequency Ordered Stop   09/21/19 1500  clindamycin (CLEOCIN) IVPB 900 mg     900 mg 100 mL/hr over 30 Minutes Intravenous On call to O.R. 09/21/19 1016 09/21/19 1325   09/21/19 1500  clindamycin (CLEOCIN) IVPB 600 mg     600 mg 100 mL/hr over 30 Minutes Intravenous Every 6 hours 09/21/19 1447 09/22/19 0400    .  Patient was given sequential compression devices, early ambulation, and aspirin for DVT prophylaxis.  Recent vital signs:  Patient Vitals for the past 24 hrs:  BP Temp Temp src Pulse Resp SpO2  09/27/19 0801 (!) 156/81 98.6 F (37 C) Oral 86 18 99 %  09/27/19 0401 (!) 145/82 98.2 F (36.8 C) - 73 17 99 %  09/26/19 2015 (!) 171/85 98.6 F (37 C) Oral 89 15 100 %  09/26/19 1546 (!) 141/83 98.9 F (37.2 C) Oral 77 18 99 %  .  Recent laboratory studies: No results found.  Discharge Medications:   Allergies as of 09/27/2019      Reactions   Penicillins Other (See Comments)   made pt feel sick  Did it involve swelling of the face/tongue/throat, SOB, or low BP? No Did it involve sudden or severe rash/hives, skin peeling, or any reaction on the inside of your mouth or nose? No Did you need to seek medical attention at a hospital or doctor's office? No When did it last happen?April 2020 If all above answers are  "NO", may proceed with cephalosporin use.   Lyrica [pregabalin] Other (See Comments)   MADE PATIENT VERY EMOTIONAL AND WOULD CRY EASILY   Morphine And Related Nausea And Vomiting   Tramadol Nausea And Vomiting   Pt cant tolerate this pain med.       Medication List    STOP taking these medications   cadexomer iodine 0.9 % gel Commonly known as: IODOSORB   doxycycline 100 MG tablet Commonly known as: VIBRA-TABS   doxycycline 50 MG capsule Commonly known as: VIBRAMYCIN   insulin regular 100 units/mL injection Commonly known as: HumuLIN R   Pen Needles 31G X 5 MM Misc   pentoxifylline 400 MG CR tablet Commonly known as: TRENTAL   silver sulfADIAZINE 1 % cream Commonly known as: Silvadene     TAKE these medications   acetaminophen 325 MG tablet Commonly known as: TYLENOL Take 2 tablets (650 mg total) by mouth every 6 (six) hours as needed for mild pain or headache.   amitriptyline 10 MG tablet Commonly known as: ELAVIL Take 0.5 tablets (5 mg total) by mouth at bedtime.   amLODipine 5 MG tablet Commonly known as: NORVASC Take 1 tablet (5 mg total) by mouth daily.   aspirin EC 81 MG tablet Take 81 mg by mouth daily.   atorvastatin 80 MG tablet Commonly known as: LIPITOR Take 1 tablet (80 mg  total) by mouth daily at 6 PM.   azelastine 0.1 % nasal spray Commonly known as: Astelin Place 2 sprays into both nostrils 2 (two) times daily. Use in each nostril as directed   docusate sodium 100 MG capsule Commonly known as: COLACE Take 1 capsule (100 mg total) by mouth 2 (two) times daily.   escitalopram 10 MG tablet Commonly known as: LEXAPRO Take 1 tablet (10 mg total) by mouth daily.   fluticasone 50 MCG/ACT nasal spray Commonly known as: FLONASE SPRAY 2 SPRAYS INTO EACH NOSTRIL EVERY DAY   Insulin Glargine 100 UNIT/ML Solostar Pen Commonly known as: LANTUS Inject 15 Units into the skin 2 (two) times daily. What changed: how much to take   isosorbide  mononitrate 30 MG 24 hr tablet Commonly known as: IMDUR Take 0.5 tablets (15 mg total) by mouth daily. For one month then stop.   levothyroxine 25 MCG tablet Commonly known as: SYNTHROID TAKE 1 TABLET (25 MCG TOTAL) BY MOUTH DAILY BEFORE BREAKFAST.   metoCLOPramide 5 MG tablet Commonly known as: REGLAN Take 1 tablet (5 mg total) by mouth 4 (four) times daily -  before meals and at bedtime.   multivitamin with minerals Tabs tablet Take 1 tablet by mouth daily.   ondansetron 4 MG tablet Commonly known as: Zofran Take 1 tablet (4 mg total) by mouth 2 (two) times daily as needed for nausea or vomiting.   ONE TOUCH ULTRA 2 w/Device Kit Check blood sugars twice daily. Dx:E11.59   ONE TOUCH ULTRA TEST test strip Generic drug: glucose blood USE ONCE DAILY TO CHECK BLOOD SUGAR. DX E11.9   pantoprazole 40 MG tablet Commonly known as: PROTONIX Take 1 tablet (40 mg total) by mouth daily.       Diagnostic Studies: No results found.  Patient benefited maximally from their hospital stay and there were no complications.     Disposition: Discharge disposition: 02-Transferred to [facility]      Discharge Instructions    Call MD / Call 911   Complete by: As directed    If you experience chest pain or shortness of breath, CALL 911 and be transported to the hospital emergency room.  If you develope a fever above 101 F, pus (white drainage) or increased drainage or redness at the wound, or calf pain, call your surgeon's office.   Constipation Prevention   Complete by: As directed    Drink plenty of fluids.  Prune juice may be helpful.  You may use a stool softener, such as Colace (over the counter) 100 mg twice a day.  Use MiraLax (over the counter) for constipation as needed.   Diet - low sodium heart healthy   Complete by: As directed    Increase activity slowly as tolerated   Complete by: As directed    Neg Press Wound Therapy / Incisional   Complete by: As directed     Show patient how to attach prevena pump   Negative Pressure Wound Therapy - Incisional   Complete by: As directed    Show patient how to attach prevena pump     Follow-up Information    [name], MD In 1 week.   Specialty: Orthopedic Surgery Contact information: [address] [place] [address] [phone]            Signed: Bevely Palmer Persons 09/27/2019, 12:12 PM

## 2019-09-28 ENCOUNTER — Inpatient Hospital Stay (HOSPITAL_COMMUNITY): Payer: BC Managed Care – PPO

## 2019-09-28 ENCOUNTER — Inpatient Hospital Stay (HOSPITAL_COMMUNITY): Payer: BC Managed Care – PPO | Admitting: Occupational Therapy

## 2019-09-28 DIAGNOSIS — Z89511 Acquired absence of right leg below knee: Secondary | ICD-10-CM

## 2019-09-28 DIAGNOSIS — Z89512 Acquired absence of left leg below knee: Secondary | ICD-10-CM

## 2019-09-28 LAB — COMPREHENSIVE METABOLIC PANEL
ALT: 13 U/L (ref 0–44)
AST: 15 U/L (ref 15–41)
Albumin: 2.1 g/dL — ABNORMAL LOW (ref 3.5–5.0)
Alkaline Phosphatase: 75 U/L (ref 38–126)
Anion gap: 12 (ref 5–15)
BUN: 17 mg/dL (ref 6–20)
CO2: 28 mmol/L (ref 22–32)
Calcium: 8.8 mg/dL — ABNORMAL LOW (ref 8.9–10.3)
Chloride: 96 mmol/L — ABNORMAL LOW (ref 98–111)
Creatinine, Ser: 0.94 mg/dL (ref 0.44–1.00)
GFR calc Af Amer: >60 mL/min (ref >60)
GFR calc non Af Amer: >60 mL/min (ref >60)
Glucose, Bld: 187 mg/dL — ABNORMAL HIGH (ref 70–99)
Potassium: 4 mmol/L (ref 3.5–5.1)
Sodium: 136 mmol/L (ref 135–145)
Total Bilirubin: 0.4 mg/dL (ref 0.3–1.2)
Total Protein: 7 g/dL (ref 6.5–8.1)

## 2019-09-28 LAB — CBC WITH DIFFERENTIAL/PLATELET
Abs Immature Granulocytes: 0.02 10*3/uL (ref 0.00–0.07)
Basophils Absolute: 0.1 10*3/uL (ref 0.0–0.1)
Basophils Relative: 1 %
Eosinophils Absolute: 0.2 10*3/uL (ref 0.0–0.5)
Eosinophils Relative: 2 %
HCT: 34.4 % — ABNORMAL LOW (ref 36.0–46.0)
Hemoglobin: 10.7 g/dL — ABNORMAL LOW (ref 12.0–15.0)
Immature Granulocytes: 0 %
Lymphocytes Relative: 15 %
Lymphs Abs: 1.4 10*3/uL (ref 0.7–4.0)
MCH: 26.9 pg (ref 26.0–34.0)
MCHC: 31.1 g/dL (ref 30.0–36.0)
MCV: 86.4 fL (ref 80.0–100.0)
Monocytes Absolute: 0.9 10*3/uL (ref 0.1–1.0)
Monocytes Relative: 9 %
Neutro Abs: 7.1 10*3/uL (ref 1.7–7.7)
Neutrophils Relative %: 73 %
Platelets: 640 10*3/uL — ABNORMAL HIGH (ref 150–400)
RBC: 3.98 MIL/uL (ref 3.87–5.11)
RDW: 12.9 % (ref 11.5–15.5)
WBC: 9.6 10*3/uL (ref 4.0–10.5)
nRBC: 0 % (ref 0.0–0.2)

## 2019-09-28 LAB — GLUCOSE, CAPILLARY
Glucose-Capillary: 189 mg/dL — ABNORMAL HIGH (ref 70–99)
Glucose-Capillary: 142 mg/dL — ABNORMAL HIGH (ref 70–99)
Glucose-Capillary: 144 mg/dL — ABNORMAL HIGH (ref 70–99)
Glucose-Capillary: 154 mg/dL — ABNORMAL HIGH (ref 70–99)

## 2019-09-28 MED ORDER — AMLODIPINE BESYLATE 10 MG PO TABS
10.0000 mg | ORAL_TABLET | Freq: Every day | ORAL | Status: DC
  Administered 2019-09-29 – 2019-10-12 (×14): 10 mg via ORAL
  Filled 2019-09-28 (×14): qty 1

## 2019-09-28 MED ORDER — HYDROXYZINE HCL 10 MG PO TABS
10.0000 mg | ORAL_TABLET | Freq: Three times a day (TID) | ORAL | Status: DC | PRN
  Filled 2019-09-28: qty 1

## 2019-09-28 MED ORDER — POLYETHYLENE GLYCOL 3350 17 G PO PACK
17.0000 g | PACK | Freq: Every day | ORAL | Status: DC
  Administered 2019-09-29 – 2019-10-08 (×9): 17 g via ORAL
  Filled 2019-09-28 (×13): qty 1

## 2019-09-28 NOTE — Evaluation (Signed)
Occupational Therapy Assessment and Plan  Patient Details  Name: [name] MRN: [id number] Date of Birth: December 13, 1965  OT Diagnosis: muscle weakness (generalized) and L BKA Rehab Potential: Rehab Potential (ACUTE ONLY): Good ELOS: 5-7 days   Today's Date: 09/28/2019 OT Individual Time: 0539-7673  & 1100-1200 OT Individual Time Calculation (min): 38 min   & 60 min  Problem List:  Patient Active Problem List   Diagnosis Date Noted  . S/P bilateral BKA (below knee amputation) ([place]) 09/27/2019  . Acute osteomyelitis of left foot ([place]) 09/21/2019  . Subacute osteomyelitis, left ankle and foot ([place])   . Pressure injury of left heel, unstageable ([place]) 02/09/2019  . Hypoglycemia   . Diabetes mellitus type 2 in nonobese (H[facility])   . Labile blood pressure   . Labile blood glucose   . Phantom limb pain ([place])   . CKD (chronic kidney disease), stage III   . Hypothyroidism   . Essential hypertension   . Chronic diastolic congestive heart failure ([place])   . Coronary artery disease involving coronary bypass graft of native heart without angina pectoris   . Poorly controlled type 2 diabetes mellitus with peripheral neuropathy ([place])   . Acute on chronic anemia   . History of below knee amputation, right ([place]) 12/31/2018  . S/P CABG x 3 06/28/2018  . Coronary artery disease involving native coronary artery of native heart with angina pectoris ([place]) 06/25/2018  . Type 2 diabetes mellitus with vascular disease ([place]) 06/24/2018  . Acute on chronic diastolic CHF (congestive heart failure) ([place]) 06/24/2018  . Non-ST elevation (NSTEMI) myocardial infarction ([place]) 06/24/2018  . Chest pain 06/23/2018  . Glaucoma 09/17/2017  . Diabetic retinopathy ([place]) 09/17/2017  . Otitis media 11/10/2016  . Decreased visual acuity 11/10/2016  . Spinal stenosis, lumbar region with neurogenic claudication 10/22/2016  . Other spondylosis with radiculopathy, lumbar region 10/13/2016  . Lipoma of abdominal wall  10/05/2016  . Severe protein-calorie malnutrition ([place]) 09/04/2016  . Anemia of chronic disease 09/04/2016  . Diabetic polyneuropathy associated with type 2 diabetes mellitus ([place]) 05/07/2016  . Orthostatic hypotension 05/07/2016  . Cerebrovascular disease 04/27/2016  . N&V (nausea and vomiting) 04/01/2016  . ICH (intracerebral hemorrhage) ([place]) 03/30/2016  . Visit for preventive health examination 05/21/2015  . Breast cancer screening 05/21/2015  . Arm lesion 07/02/2014  . Pain in limb 01/13/2014  . Neuropathic pain of both legs 01/13/2014  . Hip pain 01/12/2014  . Allergic rhinitis 03/10/2013  . Back pain 03/08/2013  . Depression 12/18/2012  . PVD (peripheral vascular disease) ([place]) 01/21/2012  . Hyperlipidemia 12/06/2010  . Overweight(278.02) 12/06/2010  . RESTLESS LEG SYNDROME 10/25/2010  . Migraine without aura 10/07/2010  . Hereditary and idiopathic peripheral neuropathy 10/07/2010  . Essential hypertension, benign 10/07/2010  . DISTURBANCE OF SKIN SENSATION 10/07/2010  . HEART MURMUR, HX OF 10/07/2010  . NEPHROLITHIASIS, HX OF 10/07/2010    Past Medical History:  Past Medical History:  Diagnosis Date  . Abscess of great toe, right   . Acute on chronic diastolic CHF (congestive heart failure) ([place]) 06/24/2018  . Acute osteomyelitis of toe, left ([place]) 09/05/2016  . Amputation of right great toe ([place]) 12/22/2018  . Anxiety   . Cataract    left - surgery to remove  . Cellulitis of foot, right 12/11/2018  . COMMON MIGRAINE 10/07/2010  . Decreased visual acuity 11/10/2016  . Depression 12/18/2012  . Diabetes mellitus type II, uncontrolled ([place]) 10/07/2010   Qualifier: Diagnosis of  By: Charlett Blake MD, [name]    .  Diabetic foot infection ([place]) 08/26/2016  . Diabetic infection of right foot ([place])   . Disturbance of skin sensation 10/07/2010  . Gangrene of right foot ([place])   . GERD (gastroesophageal reflux disease)   . Heart murmur   . History of kidney stones    "years ago" -  passed stones  . Hyperlipidemia 12/06/2010  . Hypertension   . Hypothyroidism   . Lipoma of abdominal wall 10/05/2016  . Overweight(278.02) 12/06/2010  . PERIPHERAL NEUROPATHY, FEET 10/07/2010  . PVD (peripheral vascular disease) ([place]) 01/21/2012  . RESTLESS LEG SYNDROME 10/25/2010  . Stroke [facility]) 2014, 2017   most recently in 2/17 - intracerebral hemorrhage   Past Surgical History:  Past Surgical History:  Procedure Laterality Date  . AMPUTATION Right 12/13/2018   Procedure: AMPUTATION RIGHT GREAT TOE, LOCAL RELOCATION OF TISSUE FOR WOUND CLOSURE 9cm x 3cm, VAC APPLICATION;  Surgeon: [name], MD;  Location: [facility];  Service: Orthopedics;  Laterality: Right;  . AMPUTATION Right 12/31/2018   Procedure: RIGHT BELOW KNEE AMPUTATION;  Surgeon: [name], MD;  Location: [facility];  Service: Orthopedics;  Laterality: Right;  . AMPUTATION Left 09/21/2019   Procedure: LEFT BELOW KNEE AMPUTATION;  Surgeon: [name], MD;  Location: [facility];  Service: Orthopedics;  Laterality: Left;  . AMPUTATION TOE Left 09/09/2016   Procedure: AMPUTATION OF LEFT GREAT TOE;  Surgeon: Milly Jakob, MD;  Location: [place];  Service: Orthopedics;  Laterality: Left;  . BELOW KNEE LEG AMPUTATION Left 09/21/2019  . CARDIAC CATHETERIZATION  06/24/2018  . CORONARY ARTERY BYPASS GRAFT N/A 06/28/2018   Procedure: CORONARY ARTERY BYPASS GRAFTING (CABG) times  four, using left internal mammary artery, endoscopically harvested right saphenous vein, and harvested left radial artery;  Surgeon: [name], MD;  Location: [facility];  Service: Open Heart Surgery;  Laterality: N/A;  . ENDOVEIN HARVEST OF GREATER SAPHENOUS VEIN Right 06/28/2018   Procedure: ENDOVEIN HARVEST OF GREATER SAPHENOUS VEIN;  Surgeon: [name], MD;  Location: [facility];  Service: Open Heart Surgery;  Laterality: Right;  . EYE SURGERY Left 06/2017   [facility]  . LEFT HEART CATH AND CORONARY ANGIOGRAPHY N/A 06/24/2018    Procedure: LEFT HEART CATH AND CORONARY ANGIOGRAPHY;  Surgeon: Troy Sine, MD;  Location: [facility];  Service: Cardiovascular;  Laterality: N/A;  . RADIAL ARTERY HARVEST Left 06/28/2018   Procedure: RADIAL ARTERY HARVEST;  Surgeon: [name], MD;  Location: [facility];  Service: Open Heart Surgery;  Laterality: Left;  . TEE WITHOUT CARDIOVERSION N/A 06/28/2018   Procedure: TRANSESOPHAGEAL ECHOCARDIOGRAM (TEE);  Surgeon: [name], MD;  Location: [facility];  Service: Open Heart Surgery;  Laterality: N/A;  . WISDOM TOOTH EXTRACTION      Assessment & Plan Clinical Impression: Patient is a 54 y.o. year old female with history of anxiety, chronic kidney disease stage III, acute on chronic diastolic congestive heart failure, CAD with CABG October 2019, remote tobacco abuse 14 years ago, obesity with BMI 29.26 hypertension, intracerebral hemorrhage February 2017 secondary to hypertensive crisis, hyperlipidemia, diabetes mellitus and peripheral vascular disease.  Patient well-known to rehab services after right BKA 12/31/2018 and received inpatient rehab services 01/05/2019 to 01/15/2019 and discharged to home.  Per chart review patient lives with her children.  1 level home ramped entrance.  She does have a prosthesis for right lower extremity that she has used on a limited basis primarily using wheelchair.  Presented 09/21/2019 with gangrenous changes  to left heel with significant odor and drainage.  She had been on doxycycline for 2 weeks followed by Dr. [name] Given.  No change with conservative care with increased ischemic changes and underwent left transtibial amputation 09/21/2019 per Dr. [name] Given.  Hospital course pain management with wound VAC as directed.     Patient transferred to CIR on 09/27/2019 .    Patient currently requires mod with basic self-care skills and IADL secondary to muscle weakness and amputation and decreased postural control.  Prior to hospitalization, patient could  complete adl with supervision.  Patient will benefit from skilled intervention to decrease level of assist with basic self-care skills, increase independence with basic self-care skills and increase level of independence with iADL prior to discharge home with care partner.  Anticipate patient will require intermittent supervision and follow up home health.  OT - End of Session Activity Tolerance: Tolerates 30+ min activity with multiple rests Endurance Deficit: Yes Endurance Deficit Description: fatigue with adl tasks OT Assessment Rehab Potential (ACUTE ONLY): Good OT Patient demonstrates impairments in the following area(s): Balance;Endurance;Skin Integrity OT Basic ADL's Functional Problem(s): Grooming;Bathing;Dressing;Toileting OT Advanced ADL's Functional Problem(s): Simple Meal Preparation OT Transfers Functional Problem(s): Toilet OT Plan OT Intensity: Minimum of 1-2 x/day, 45 to 90 minutes OT Frequency: 5 out of 7 days OT Duration/Estimated Length of Stay: 5-7 days OT Treatment/Interventions: Balance/vestibular training;Discharge planning;Self Care/advanced ADL retraining;Therapeutic Activities;Functional mobility training;Patient/family education;Skin care/wound managment;Therapeutic Exercise;Community reintegration;DME/adaptive equipment instruction;UE/LE Strength taining/ROM;Wheelchair propulsion/positioning OT Self Feeding Anticipated Outcome(s): independent OT Basic Self-Care Anticipated Outcome(s): supervision OT Toileting Anticipated Outcome(s): supervision OT Bathroom Transfers Anticipated Outcome(s): supervision OT Recommendation Patient destination: Home Follow Up Recommendations: Home health OT Equipment Recommended: To be determined Equipment Details: may need drop arm commode due to 2nd LB amputation   Skilled Therapeutic Intervention 11-12 am:   Patient seated in w/c.  She is pleasant and cooperative.  Evaluation completed as documented below.  Reviewed role of  OT, plan of care, DME, home accessibility, safety with transfers, weight shifts, skin care, goals for therapy and schedule.  She demonstrates good understanding.  Patient presents with general weakness and requires assistance for all aspects of self care and home management due to new BKA.  Completed adl training and practiced weight shifts for remainder of session.  She requires max A for washing hair with modified technique at sink.  She is able to perform other grooming tasks, UB washing and dressing with set up and cues.  She is able to complete weight shifts and LE repositioning with min cues. She remained in the w/c at close of session, seat belt alarm set and call bell in reach.    [id number]:  Patient seated in w/c.  Mod A sit pivot transfer w/c to/from drop arm commode with right prosthetic on - cues for safety and weight shift.  She is max A for hygiene and clothing management.  Completed sit pivot transfer back to bed with mod A due to fatigue.  Sitting edge of bed she requires mod A to remove R LE prosthesis.  Sitting to supine with CS.  Patient states that she is exhausted and feels discouraged due to mobility difficulty and bowel and bladder dysfunction.  Reviewed progress to date, Provided amputee handbook and reviewed mobility options for the future.  She states that her phantom pain is a little worse this afternoon but manageable.  She remained in bed with bed alarm set and call bell in reach.    OT Evaluation Precautions/Restrictions  Precautions Precautions:  Fall Precaution Comments: wound vac Restrictions Weight Bearing Restrictions: Yes LLE Weight Bearing: Non weight bearing Other Position/Activity Restrictions: limb guard LLE General   Vital Signs  Pain Pain Assessment Pain Scale: 0-10 Pain Score: 0-No pain Home Living/Prior Functioning Home Living Family/patient expects to be discharged to:: Private residence Living Arrangements: Other relatives Available Help at  Discharge: Family, Available 24 hours/day Type of Home: House Home Access: Ramped entrance Home Layout: One level Bathroom Shower/Tub: Tub/shower unit, Multimedia programmer: Standard Bathroom Accessibility: Yes Additional Comments: bath is not w/c accessible  Lives With: Son, Daughter Prior Function  Able to Take Stairs?: No Driving: No Comments: Used W/C prior to admission due to only weight bearing through LLE was for transfers, pt does have prothesis for RLE--has only worn it a few times but never in a weight bearing position ADL ADL Eating: Independent Where Assessed-Eating: Wheelchair Grooming: Setup Where Assessed-Grooming: Sitting at sink, Wheelchair Upper Body Bathing: Setup Where Assessed-Upper Body Bathing: Wheelchair, Sitting at sink Lower Body Bathing: Maximal assistance Where Assessed-Lower Body Bathing: Sitting at sink, Wheelchair Upper Body Dressing: Supervision/safety, Setup Where Assessed-Upper Body Dressing: Wheelchair ADL Comments: patient declined LB dressing due to bowel issues today Vision Baseline Vision/History: Wears glasses Wears Glasses: At all times Patient Visual Report: Other (comment)(patient notes history of poor acuity due to DM, left eye legally blind and poor acuity right - needs assistance for reading menu, schedule etc) Additional Comments: Right eye ROM WFL, able to see objects in environment Perception  Perception: Within Functional Limits Praxis Praxis: Intact Cognition Overall Cognitive Status: Within Functional Limits for tasks assessed Arousal/Alertness: Awake/alert Orientation Level: Person;Place;Situation Person: Oriented Place: Oriented Situation: Oriented Year: 2021 Month: January Day of Week: Correct Memory: Impaired Memory Impairment: Storage deficit;Decreased recall of new information Immediate Memory Recall: Sock;Blue;Bed Memory Recall Sock: Not able to recall Memory Recall Blue: Without Cue Memory Recall  Bed: Not able to recall Attention: Focused;Sustained Focused Attention: Impaired Focused Attention Impairment: Verbal complex Sustained Attention: Appears intact Awareness: Appears intact Problem Solving: Appears intact Safety/Judgment: Appears intact Sensation Sensation Light Touch: Appears Intact Additional Comments: UB intact Coordination Gross Motor Movements are Fluid and Coordinated: No Fine Motor Movements are Fluid and Coordinated: Yes Coordination and Movement Description: new L BKA, previous R BKA with new prosthesis Finger Nose Finger Test: [facility] Motor  Motor Motor: Other (comment);Abnormal postural alignment and control Motor - Skilled Clinical Observations: decreased sitting balance, new L BKA, previous R BKA with new prosthesis Mobility  Bed Mobility Bed Mobility: Rolling Right;Rolling Left;Supine to Sit;Sit to Supine Rolling Right: Independent Rolling Left: Independent Supine to Sit: Supervision/Verbal cueing Sit to Supine: Supervision/Verbal cueing  Trunk/Postural Assessment  Cervical Assessment Cervical Assessment: Exceptions to WFL(forward head) Thoracic Assessment Thoracic Assessment: Exceptions to WFL(rounded shoulders) Lumbar Assessment Lumbar Assessment: Exceptions to WFL(posterior pelvic tilt) Postural Control Postural Control: Deficits on evaluation  Balance Balance Balance Assessed: Yes Static Sitting Balance Static Sitting - Balance Support: No upper extremity supported;Feet unsupported Static Sitting - Level of Assistance: 5: Stand by assistance Dynamic Sitting Balance Dynamic Sitting - Balance Support: Right upper extremity supported;Left upper extremity supported Dynamic Sitting - Level of Assistance: 5: Stand by assistance Dynamic Sitting - Balance Activities: Lateral lean/weight shifting;Forward lean/weight shifting;Reaching for objects Extremity/Trunk Assessment RUE Assessment RUE Assessment: Within Functional Limits LUE Assessment LUE  Assessment: Within Functional Limits     Refer to Care Plan for Long Term Goals  Recommendations for other services: None    Discharge Criteria: Patient will be discharged  from OT if patient refuses treatment 3 consecutive times without medical reason, if treatment goals not met, if there is a change in medical status, if patient makes no progress towards goals or if patient is discharged from hospital.  The above assessment, treatment plan, treatment alternatives and goals were discussed and mutually agreed upon: by patient  Carlos Levering 09/28/2019, 1:41 PM

## 2019-09-28 NOTE — Plan of Care (Signed)
  Problem: Consults Goal: RH GENERAL PATIENT EDUCATION Description: See Patient Education module for education specifics. Outcome: Progressing Goal: Skin Care Protocol Initiated - if Braden Score 18 or less Description: If consults are not indicated, leave blank or document N/A Outcome: Progressing Goal: Nutrition Consult-if indicated Outcome: Progressing Goal: Diabetes Guidelines if Diabetic/Glucose > 140 Description: If diabetic or lab glucose is > 140 mg/dl - Initiate Diabetes/Hyperglycemia Guidelines & Document Interventions  Outcome: Progressing   Problem: RH BOWEL ELIMINATION Goal: RH STG MANAGE BOWEL WITH ASSISTANCE Description: STG Manage Bowel with mod I Assistance. Outcome: Progressing Goal: RH STG MANAGE BOWEL W/MEDICATION W/ASSISTANCE Description: STG Manage Bowel with Medication with mod I Assistance. Outcome: Progressing   Problem: RH BLADDER ELIMINATION Goal: RH STG MANAGE BLADDER WITH ASSISTANCE Description: STG Manage Bladder With mod I Assistance Outcome: Progressing   Problem: RH SKIN INTEGRITY Goal: RH STG SKIN FREE OF INFECTION/BREAKDOWN Description: Patient will not have any signs of wound infection while on IP Reb Outcome: Progressing Goal: RH STG MAINTAIN SKIN INTEGRITY WITH ASSISTANCE Description: STG Maintain Skin Integrity With mod I Assistance. Outcome: Progressing   Problem: RH SAFETY Goal: RH STG ADHERE TO SAFETY PRECAUTIONS W/ASSISTANCE/DEVICE Description: STG Adhere to Safety Precautions With Minimal Assistance/Device. Outcome: Progressing   Problem: RH PAIN MANAGEMENT Goal: RH STG PAIN MANAGED AT OR BELOW PT'S PAIN GOAL Description: Pain scale <4/10 Outcome: Progressing   Problem: RH KNOWLEDGE DEFICIT GENERAL Goal: RH STG INCREASE KNOWLEDGE OF SELF CARE AFTER HOSPITALIZATION Outcome: Progressing   Problem: Consults Goal: RH LIMB LOSS PATIENT EDUCATION Description: Description: See Patient Education module for eduction  specifics. Outcome: Progressing Goal: Skin Care Protocol Initiated - if Braden Score 18 or less Description: If consults are not indicated, leave blank or document N/A Outcome: Progressing Goal: Nutrition Consult-if indicated Outcome: Progressing Goal: Diabetes Guidelines if Diabetic/Glucose > 140 Description: If diabetic or lab glucose is > 140 mg/dl - Initiate Diabetes/Hyperglycemia Guidelines & Document Interventions  Outcome: Progressing   Problem: RH SKIN INTEGRITY Goal: RH STG SKIN FREE OF INFECTION/BREAKDOWN Outcome: Progressing Goal: RH STG MAINTAIN SKIN INTEGRITY WITH ASSISTANCE Description: STG Maintain Skin Integrity With Assistance. Outcome: Progressing Goal: RH STG ABLE TO PERFORM INCISION/WOUND CARE W/ASSISTANCE Description: STG Able To Perform Incision/Wound Care With Assistance. Outcome: Progressing   Problem: RH SAFETY Goal: RH STG ADHERE TO SAFETY PRECAUTIONS W/ASSISTANCE/DEVICE Description: STG Adhere to Safety Precautions With Assistance/Device. Outcome: Progressing   Problem: RH PAIN MANAGEMENT Goal: RH STG PAIN MANAGED AT OR BELOW PT'S PAIN GOAL Outcome: Progressing

## 2019-09-28 NOTE — Progress Notes (Signed)
Inpatient Rehabilitation  Patient information reviewed and entered into eRehab system by Arlisa Leclere M. Abdallah Hern, M.A., CCC/SLP, PPS Coordinator.  Information including medical coding, functional ability and quality indicators will be reviewed and updated through discharge.    

## 2019-09-28 NOTE — Evaluation (Addendum)
Physical Therapy Assessment and Plan  Patient Details  Name: [name] MRN: [id number] Date of Birth: Oct 07, 1965  PT Diagnosis: Abnormal posture, Abnormality of gait, Cognitive deficits, Difficulty walking, Edema, Impaired cognition, Impaired sensation, Muscle weakness and Pain in L residual limb and R LE phantom pain Rehab Potential: Good ELOS: 5-7 days   Today's Date: 09/28/2019 PT Individual Time: 0905-1010 and 1510-1530 PT Individual Time Calculation (min): 65 min and 20 min   Problem List:  Patient Active Problem List   Diagnosis Date Noted  . S/P bilateral BKA (below knee amputation) ([place]) 09/27/2019  . Acute osteomyelitis of left foot ([place]) 09/21/2019  . Subacute osteomyelitis, left ankle and foot ([place])   . Pressure injury of left heel, unstageable ([place]) 02/09/2019  . Hypoglycemia   . Diabetes mellitus type 2 in nonobese (H[facility])   . Labile blood pressure   . Labile blood glucose   . Phantom limb pain ([place])   . CKD (chronic kidney disease), stage III   . Hypothyroidism   . Essential hypertension   . Chronic diastolic congestive heart failure ([place])   . Coronary artery disease involving coronary bypass graft of native heart without angina pectoris   . Poorly controlled type 2 diabetes mellitus with peripheral neuropathy ([place])   . Acute on chronic anemia   . History of below knee amputation, right ([place]) 12/31/2018  . S/P CABG x 3 06/28/2018  . Coronary artery disease involving native coronary artery of native heart with angina pectoris ([place]) 06/25/2018  . Type 2 diabetes mellitus with vascular disease ([place]) 06/24/2018  . Acute on chronic diastolic CHF (congestive heart failure) ([place]) 06/24/2018  . Non-ST elevation (NSTEMI) myocardial infarction ([place]) 06/24/2018  . Chest pain 06/23/2018  . Glaucoma 09/17/2017  . Diabetic retinopathy ([place]) 09/17/2017  . Otitis media 11/10/2016  . Decreased visual acuity 11/10/2016  . Spinal stenosis, lumbar region with neurogenic  claudication 10/22/2016  . Other spondylosis with radiculopathy, lumbar region 10/13/2016  . Lipoma of abdominal wall 10/05/2016  . Severe protein-calorie malnutrition ([place]) 09/04/2016  . Anemia of chronic disease 09/04/2016  . Diabetic polyneuropathy associated with type 2 diabetes mellitus ([place]) 05/07/2016  . Orthostatic hypotension 05/07/2016  . Cerebrovascular disease 04/27/2016  . N&V (nausea and vomiting) 04/01/2016  . ICH (intracerebral hemorrhage) ([place]) 03/30/2016  . Visit for preventive health examination 05/21/2015  . Breast cancer screening 05/21/2015  . Arm lesion 07/02/2014  . Pain in limb 01/13/2014  . Neuropathic pain of both legs 01/13/2014  . Hip pain 01/12/2014  . Allergic rhinitis 03/10/2013  . Back pain 03/08/2013  . Depression 12/18/2012  . PVD (peripheral vascular disease) ([place]) 01/21/2012  . Hyperlipidemia 12/06/2010  . Overweight(278.02) 12/06/2010  . RESTLESS LEG SYNDROME 10/25/2010  . Migraine without aura 10/07/2010  . Hereditary and idiopathic peripheral neuropathy 10/07/2010  . Essential hypertension, benign 10/07/2010  . DISTURBANCE OF SKIN SENSATION 10/07/2010  . HEART MURMUR, HX OF 10/07/2010  . NEPHROLITHIASIS, HX OF 10/07/2010    Past Medical History:  Past Medical History:  Diagnosis Date  . Abscess of great toe, right   . Acute on chronic diastolic CHF (congestive heart failure) ([place]) 06/24/2018  . Acute osteomyelitis of toe, left ([place]) 09/05/2016  . Amputation of right great toe ([place]) 12/22/2018  . Anxiety   . Cataract    left - surgery to remove  . Cellulitis of foot, right 12/11/2018  . COMMON MIGRAINE 10/07/2010  . Decreased visual acuity 11/10/2016  . Depression 12/18/2012  . Diabetes mellitus  type II, uncontrolled ([place]) 10/07/2010   Qualifier: Diagnosis of  By: Charlett Blake MD, [name]    . Diabetic foot infection ([place]) 08/26/2016  . Diabetic infection of right foot ([place])   . Disturbance of skin sensation 10/07/2010  . Gangrene of right foot  ([place])   . GERD (gastroesophageal reflux disease)   . Heart murmur   . History of kidney stones    "years ago" - passed stones  . Hyperlipidemia 12/06/2010  . Hypertension   . Hypothyroidism   . Lipoma of abdominal wall 10/05/2016  . Overweight(278.02) 12/06/2010  . PERIPHERAL NEUROPATHY, FEET 10/07/2010  . PVD (peripheral vascular disease) ([place]) 01/21/2012  . RESTLESS LEG SYNDROME 10/25/2010  . Stroke [facility]) 2014, 2017   most recently in 2/17 - intracerebral hemorrhage   Past Surgical History:  Past Surgical History:  Procedure Laterality Date  . AMPUTATION Right 12/13/2018   Procedure: AMPUTATION RIGHT GREAT TOE, LOCAL RELOCATION OF TISSUE FOR WOUND CLOSURE 9cm x 3cm, VAC APPLICATION;  Surgeon: [name], MD;  Location: [place];  Service: Orthopedics;  Laterality: Right;  . AMPUTATION Right 12/31/2018   Procedure: RIGHT BELOW KNEE AMPUTATION;  Surgeon: [name], MD;  Location: [place];  Service: Orthopedics;  Laterality: Right;  . AMPUTATION Left 09/21/2019   Procedure: LEFT BELOW KNEE AMPUTATION;  Surgeon: [name], MD;  Location: [place];  Service: Orthopedics;  Laterality: Left;  . AMPUTATION TOE Left 09/09/2016   Procedure: AMPUTATION OF LEFT GREAT TOE;  Surgeon: Milly Jakob, MD;  Location: [place];  Service: Orthopedics;  Laterality: Left;  . BELOW KNEE LEG AMPUTATION Left 09/21/2019  . CARDIAC CATHETERIZATION  06/24/2018  . CORONARY ARTERY BYPASS GRAFT N/A 06/28/2018   Procedure: CORONARY ARTERY BYPASS GRAFTING (CABG) times  four, using left internal mammary artery, endoscopically harvested right saphenous vein, and harvested left radial artery;  Surgeon: [name], MD;  Location: [place];  Service: Open Heart Surgery;  Laterality: N/A;  . ENDOVEIN HARVEST OF GREATER SAPHENOUS VEIN Right 06/28/2018   Procedure: ENDOVEIN HARVEST OF GREATER SAPHENOUS VEIN;  Surgeon: [name], MD;  Location: [place];  Service: Open Heart Surgery;   Laterality: Right;  . EYE SURGERY Left 06/2017   [facility]  . LEFT HEART CATH AND CORONARY ANGIOGRAPHY N/A 06/24/2018   Procedure: LEFT HEART CATH AND CORONARY ANGIOGRAPHY;  Surgeon: Troy Sine, MD;  Location: [place] [facility];  Service: Cardiovascular;  Laterality: N/A;  . RADIAL ARTERY HARVEST Left 06/28/2018   Procedure: RADIAL ARTERY HARVEST;  Surgeon: [name], MD;  Location: [place];  Service: Open Heart Surgery;  Laterality: Left;  . TEE WITHOUT CARDIOVERSION N/A 06/28/2018   Procedure: TRANSESOPHAGEAL ECHOCARDIOGRAM (TEE);  Surgeon: [name], MD;  Location: [place];  Service: Open Heart Surgery;  Laterality: N/A;  . WISDOM TOOTH EXTRACTION      Assessment & Plan Clinical Impression: Patient is a 54 y.o. year old right-handed female with history of anxiety, chronic kidney disease stage III, acute on chronic diastolic congestive heart failure, CAD with CABG October 2019, remote tobacco abuse 14 years ago, obesity with BMI 29.26 hypertension, intracerebral hemorrhage February 2017 secondary to hypertensive crisis, hyperlipidemia, diabetes mellitus and peripheral vascular disease.  Patient well-known to rehab services after right BKA 12/31/2018 and received inpatient rehab services 01/05/2019 to 01/15/2019 and discharged to home.  Per chart review patient lives with her children.  1 level home ramped entrance.  She does have a prosthesis for  right lower extremity that she has used on a limited basis primarily using wheelchair.  Presented 09/21/2019 with gangrenous changes to left heel with significant odor and drainage.  She had been on doxycycline for 2 weeks followed by Dr. [name] Given.  No change with conservative care with increased ischemic changes and underwent left transtibial amputation 09/21/2019 per Dr. [name] Given.  Hospital course pain management with wound VAC as directed.  Therapy evaluations completed and patient was admitted for a comprehensive rehab program.  Patient transferred  to CIR on 09/27/2019 .   Patient currently requires min with mobility secondary to muscle weakness and muscle joint tightness, decreased cardiorespiratoy endurance, decreased visual acuity, decreased memory and delayed processing and decreased sitting balance, decreased standing balance, decreased postural control, decreased balance strategies and difficulty maintaining precautions.  Prior to hospitalization, patient was supervision with mobility and lived with Son, Daughter in a House home.  Home access is  Ramped entrance.  Patient will benefit from skilled PT intervention to maximize safe functional mobility, minimize fall risk and decrease caregiver burden for planned discharge home with intermittent assist.  Anticipate patient will benefit from follow up [facility] at discharge.  PT - End of Session Activity Tolerance: Tolerates 30+ min activity with multiple rests Endurance Deficit: Yes Endurance Deficit Description: Requires frequent rest breaks with minimal activity PT Assessment Rehab Potential (ACUTE/IP ONLY): Good PT Barriers to Discharge: Weight bearing restrictions;Incontinence;Behavior PT Barriers to Discharge Comments: NWB L LE, new incontinence since surgery, requires significant residual limb and prosthesis care education and functional mobility PT Patient demonstrates impairments in the following area(s): Balance;Behavior;Edema;Endurance;Motor;Nutrition;Pain;Perception;Safety;Sensory;Skin Integrity PT Transfers Functional Problem(s): Bed Mobility;Bed to Chair;Car;Furniture;Floor PT Locomotion Functional Problem(s): Ambulation;Wheelchair Mobility;Stairs PT Plan PT Intensity: Minimum of 1-2 x/day ,45 to 90 minutes PT Frequency: 5 out of 7 days PT Duration Estimated Length of Stay: 5-7 days PT Treatment/Interventions: Ambulation/gait training;Discharge planning;Functional mobility training;Psychosocial support;Therapeutic Activities;Visual/perceptual  remediation/compensation;Balance/vestibular training;Disease management/prevention;Neuromuscular re-education;Skin care/wound management;Therapeutic Exercise;Wheelchair propulsion/positioning;DME/adaptive equipment instruction;Pain management;Splinting/orthotics;UE/LE Strength taining/ROM;Community reintegration;Functional electrical stimulation;Patient/family education;Stair training;UE/LE Coordination activities PT Transfers Anticipated Outcome(s): supervision using LRAD PT Locomotion Anticipated Outcome(s): supervision w/c level, initiate ambulation with R prosthesis as tolerated PT Recommendation Recommendations for Other Services: Neuropsych consult Follow Up Recommendations: Home health PT Patient destination: Home Equipment Details: Patient has a RW and w/c from last admission, will need L amputee pad for w/c  Skilled Therapeutic Intervention In addition to the PT evaluation below, the patient performed the following skilled PT interventions:  Session 1: Patient in bed with L limb guard and wound vac in place upon PT arrival. Patient alert and agreeable to PT session. Patient denied pain during session. Patient reported 6/10 L residual limb pain during session, RN provided pain medicine prior to session. PT provided repositioning, rest breaks, and distraction as pain interventions throughout session.   Therapeutic Activity: Bed Mobility: Patient performed rolling R/L independently and supine to/from sit with supervision for safety due decreased sitting balance in a flat bed without use of bed rails. Patient donned her liner on her R residual limb with supervision sitting EOB and donned her R prosthesis with min A.   Transfers: Patient performed squat pivot transfers bed>w/c and w/c<>BSC over the toilet with min A wearing her R prosthesis. Patient was continent of bladder and required supervision for peri-care and declined donning an incontinence brief. Patient attempted sit to/from stand x1  with max A using a RW and R prosthesis, patient was unable to come to a full stand due to decreased LE strength and balance.  Wheelchair Mobility:  Patient propelled wheelchair 10 feet in the room with supervision for safety.   Educated patient about increased the length of time she wears her R prosthesis every day with the goal to be about 12 hours a day. Patient reports that she is already wearing her liner throughout the day most days. Also provided education about phantom pain, importance of ROM, desensitization, use of amputee pad on w/c, and pressure relief throughout session.   Patient in w/c with L limb guard and wound vac in place and R prosthesis donned in room at end of session with breaks locked, seat blet alarm set, and all needs within reach.   Instructed pt in results of PT evaluation as detailed below, PT POC, rehab potential, rehab goals, and discharge recommendations. Additionally discussed CIR's policies regarding fall safety and use of chair alarm and/or quick release belt. Pt verbalized understanding and in agreement. Will update pt's family members as they become available.   Session 2: Patient in bed with L limb guard and wound vac in place upon PT arrival. Patient reported feeling fatigued from taking a shower and toileting. Patient was tearful about not being able to void or have a BM since surgery and was concerned about needing catheterized this evening if she didn't go. PT comforted patient and discussed fluid and nutrition intake with the patient. Patient reported 6/10 L residual limb pain during session, RN made aware and provided pain medicine during session. PT provided repositioning, rest breaks, and distraction as pain interventions throughout session. She then reported that she did not want to get out of the bed due to fatigue and she declined bed level exercises. She did agree to participating in amputee education. Educated on techniques for mobility with B BKA,  diaphragmatic breathing for pain management, patient performed 2x10 breaths with 4 count in and out, also provided education on skin integrity and performing daily foot checks and setting up a wearing schedule for her prosthesis. Patient agreed to attempt wearing the prosthesis for at least 1 hour x2 tomorrow and try wearing the liner throughout the day. Also discussed activities the patient enjoys at end of session, she reports that she wants to get back to her grand kids and be able to play and care for them. Patient in bed at end of session with breaks locked, bed alarm set, and all needs within reach.   PT Evaluation Precautions/Restrictions Precautions Precautions: Fall Precaution Comments: wound vac Required Braces or Orthoses: (residual limb protector for LLE) Restrictions LLE Weight Bearing: Non weight bearing General Home Living/Prior Functioning Home Living Available Help at Discharge: Family;Available 24 hours/day Type of Home: House Home Access: Ramped entrance Home Layout: One level Bathroom Shower/Tub: Tub/shower unit;Walk-in shower Bathroom Toilet: Standard  Lives With: Son;Daughter Prior Function  Able to Take Stairs?: No Driving: No Comments: Used W/C prior to admission due to only weight bearing through LLE was for transfers, pt does have prothesis for RLE--has only worn it a few times but never in a weight bearing position Vision/Perception  Vision - Assessment Additional Comments: Right eye ROM WFL, able to see objects in environment Perception Perception: Within Functional Limits Praxis Praxis: Intact  Cognition Overall Cognitive Status: Within Functional Limits for tasks assessed Attention: Focused;Sustained Focused Attention: Impaired Focused Attention Impairment: Verbal complex Sustained Attention: Appears intact Memory: Impaired Memory Impairment: Storage deficit;Decreased recall of new information Awareness: Appears intact Problem Solving: Appears  intact Safety/Judgment: Appears intact Sensation Sensation Light Touch: Appears Intact Additional Comments: Limited testing  of L LE due to wound vac and paint, patient does report phantom pain in R LE. Coordination Gross Motor Movements are Fluid and Coordinated: No Fine Motor Movements are Fluid and Coordinated: Yes Coordination and Movement Description: new L BKA, previous R BKA with new prosthesis Motor  Motor Motor: Other (comment);Abnormal postural alignment and control Motor - Skilled Clinical Observations: decreased sitting balance, new L BKA, previous R BKA with new prosthesis  Mobility Bed Mobility Bed Mobility: Rolling Right;Rolling Left;Supine to Sit;Sit to Supine Rolling Right: Independent Rolling Left: Independent Supine to Sit: Supervision/Verbal cueing Sit to Supine: Supervision/Verbal cueing Transfers Transfers: Squat Pivot Transfers Squat Pivot Transfers: Minimal Assistance - Patient > 75% Transfer (Assistive device): None Locomotion  Gait Ambulation: No(new L BKA, previous R BKA with new prosthesis, limited standing tolerance at this time) Gait Gait: No(new L BKA, previous R BKA with new prosthesis, limited standing tolerance at this time) Stairs / Additional Locomotion Stairs: No(new L BKA, previous R BKA with new prosthesis, limited standing tolerance at this time) Product manager Mobility: Yes  Trunk/Postural Assessment  Cervical Assessment Cervical Assessment: Exceptions to WFL(forward head) Thoracic Assessment Thoracic Assessment: Exceptions to WFL(rounded shoulders) Lumbar Assessment Lumbar Assessment: Exceptions to WFL(posterior pelvic tilt) Postural Control Postural Control: Deficits on evaluation(decreased, new L BKA, previous R BKA with new prosthesis)  Balance Balance Balance Assessed: Yes Static Sitting Balance Static Sitting - Balance Support: No upper extremity supported;Feet unsupported Static Sitting - Level of  Assistance: 5: Stand by assistance Dynamic Sitting Balance Dynamic Sitting - Balance Support: Right upper extremity supported;Left upper extremity supported Dynamic Sitting - Level of Assistance: 5: Stand by assistance Dynamic Sitting - Balance Activities: Lateral lean/weight shifting;Forward lean/weight shifting;Reaching for objects Extremity Assessment  RUE Assessment RUE Assessment: Within Functional Limits LUE Assessment LUE Assessment: Within Functional Limits RLE Assessment RLE Assessment: Exceptions to [facility] Active Range of Motion (AROM) Comments: knee extension lacking 2 degrees, knee flexion 122 degrees in supine General Strength Comments: Grossly in sitting: hip flexion/abduction/adduction 4-/5, knee flexion/extension 4+/5 LLE Assessment LLE Assessment: Exceptions to [facility] Active Range of Motion (AROM) Comments: Lacking 25 degrees knee extension and knee flexion 76 degrees limited by pain/edema General Strength Comments: Grossly in sitting: hip flexion/abduction/adduction 4-/5, knee flexion/extension at least 3/5 unable to formally assess due to pain    Refer to Care Plan for Long Term Goals  Recommendations for other services: Neuropsych  Discharge Criteria: Patient will be discharged from PT if patient refuses treatment 3 consecutive times without medical reason, if treatment goals not met, if there is a change in medical status, if patient makes no progress towards goals or if patient is discharged from hospital.  The above assessment, treatment plan, treatment alternatives and goals were discussed and mutually agreed upon: by patient  Doreene Burke PT, DPT  09/28/2019, 12:26 PM

## 2019-09-28 NOTE — Progress Notes (Signed)
[facility] PHYSICAL MEDICINE & REHABILITATION PROGRESS NOTE   Subjective/Complaints: Straining on bed pan this morning. Would like additional stool softener to help decrease her constipation. Otherwise denies pain.  Sleeping well at night   Objective:   No results found. Recent Labs    09/28/19 0557  WBC 9.6  HGB 10.7*  HCT 34.4*  PLT 640*   Recent Labs    09/28/19 0557  NA 136  K 4.0  CL 96*  CO2 28  GLUCOSE 187*  BUN 17  CREATININE 0.94  CALCIUM 8.8*    Intake/Output Summary (Last 24 hours) at 09/28/2019 1010 Last data filed at 09/28/2019 0900 Gross per 24 hour  Intake 520 ml  Output 1100 ml  Net -580 ml     Physical Exam: Vital Signs Blood pressure (!) 171/93, pulse 83, temperature 98.2 F (36.8 C), resp. rate 19, height 5\' 2"  (1.575 m), weight 59.9 kg, last menstrual period 02/12/2011, SpO2 98 %. General: Alert and oriented x 3, No apparent distress HEENT: Head is normocephalic, atraumatic, PERRLA, EOMI, sclera anicteric, oral mucosa pink and moist, dentition intact, ext ear canals clear,  Neck: Supple without JVD or lymphadenopathy Heart: Reg rate and rhythm. No murmurs rubs or gallops Chest: CTA bilaterally without wheezes, rales, or rhonchi; no distress Abdomen: Soft, non-tender, non-distended, bowel sounds positive. Extremities: No clubbing, cyanosis, or edema. Pulses are 2+ Neurological: Patient is alert no acute distress.  Oriented x3 and follows commands.  Skin: Left BKA with limb guard as well as a wound VAC in place.  Right BKA is well-healed Psych: Pt's affect is appropriate. Pt is cooperative  Assessment/Plan: 1. Functional deficits secondary to bilateral BKA which require 3+ hours per day of interdisciplinary therapy in a comprehensive inpatient rehab setting.  Physiatrist is providing close team supervision and 24 hour management of active medical problems listed below.  Physiatrist and rehab team continue to assess barriers to  discharge/monitor patient progress toward functional and medical goals  Care Tool:  Bathing              Bathing assist       Upper Body Dressing/Undressing Upper body dressing   What is the patient wearing?: Pull over shirt    Upper body assist      Lower Body Dressing/Undressing Lower body dressing      What is the patient wearing?: Pants     Lower body assist       Toileting Toileting    Toileting assist       Transfers Chair/bed transfer  Transfers assist           Locomotion Ambulation   Ambulation assist              Walk 10 feet activity   Assist           Walk 50 feet activity   Assist           Walk 150 feet activity   Assist           Walk 10 feet on uneven surface  activity   Assist           Wheelchair     Assist               Wheelchair 50 feet with 2 turns activity    Assist            Wheelchair 150 feet activity     Assist  Blood pressure (!) 171/93, pulse 83, temperature 98.2 F (36.8 C), resp. rate 19, height 5\' 2"  (1.575 m), weight 59.9 kg, last menstrual period 02/12/2011, SpO2 98 %.  Medical Problem List and Plan: 1.  Decreased functional mobility secondary to left transtibial amputation 09/21/2019 with wound VAC as directed as well as history of right BKA 12/31/2018.  Patient does have a right prosthesis.             -patient may shower but wound vac must be covered.              -ELOS/Goals: 10-14 days, modI in PT, OT, and I in SLP 2.  Antithrombotics: -DVT/anticoagulation: Patient has bilateral BKA             -antiplatelet therapy: Aspirin 81 mg daily 3. Pain Management: Oxycodone and Robaxin as needed 4. Mood: Provide emotional support             -antipsychotic agents: N/A 5. Neuropsych: This patient is capable of making decisions on her own behalf. 6. Skin/Wound Care: Routine skin checks 7. Fluids/Electrolytes/Nutrition: Routine in and outs  with follow-up chemistries 8.  CKD stage III.  Baseline creatinine 1.21-2.13.  Follow-up chemistries 9.  CAD with CABG October 2019.  Continue aspirin therapy 10.  Acute on chronic diastolic congestive heart failure.  Monitor for any signs of fluid overload 11.  History of intracerebral hemorrhage February 2017.  Follow-up outpatient 12.  Diabetes mellitus with peripheral neuropathy.  Hemoglobin A1c 14.1.  SSI.  Patient on Lantus insulin 50 units twice daily prior to admission.  Resume as needed 13.  Hypertension.  Norvasc 5 mg daily. Systolics in last 2 days ranging from 141 to 171. Consider increasing Norvasc to 10mg  daily.   1/20: Will increase Norvasc to 10mg  for uncontrolled BP. 14.  Hyperlipidemia.  Lipitor 15.  Constipation: Docusate 100mg  BID. Has not yet had BM. Consider changing to Senna-docusate BID.   1/20: Still constipated, will add daily Miralax 16. Itching: Will start hydroxyzine prn.    LOS: 1 days A FACE TO FACE EVALUATION WAS PERFORMED   P  09/28/2019, 10:10 AM

## 2019-09-29 ENCOUNTER — Inpatient Hospital Stay (HOSPITAL_COMMUNITY): Payer: BC Managed Care – PPO

## 2019-09-29 ENCOUNTER — Inpatient Hospital Stay (HOSPITAL_COMMUNITY): Payer: BC Managed Care – PPO | Admitting: Occupational Therapy

## 2019-09-29 ENCOUNTER — Inpatient Hospital Stay (HOSPITAL_COMMUNITY): Payer: BC Managed Care – PPO | Admitting: *Deleted

## 2019-09-29 LAB — GLUCOSE, CAPILLARY
Glucose-Capillary: 138 mg/dL — ABNORMAL HIGH (ref 70–99)
Glucose-Capillary: 142 mg/dL — ABNORMAL HIGH (ref 70–99)
Glucose-Capillary: 149 mg/dL — ABNORMAL HIGH (ref 70–99)
Glucose-Capillary: 153 mg/dL — ABNORMAL HIGH (ref 70–99)

## 2019-09-29 MED ORDER — HYDRALAZINE HCL 10 MG PO TABS
10.0000 mg | ORAL_TABLET | Freq: Every day | ORAL | Status: DC
  Administered 2019-09-30: 08:00:00 10 mg via ORAL
  Filled 2019-09-29: qty 1

## 2019-09-29 NOTE — Plan of Care (Signed)
  Problem: Consults Goal: RH GENERAL PATIENT EDUCATION Description: See Patient Education module for education specifics. Outcome: Progressing Goal: Skin Care Protocol Initiated - if Braden Score 18 or less Description: If consults are not indicated, leave blank or document N/A Outcome: Progressing Goal: Nutrition Consult-if indicated Outcome: Progressing Goal: Diabetes Guidelines if Diabetic/Glucose > 140 Description: If diabetic or lab glucose is > 140 mg/dl - Initiate Diabetes/Hyperglycemia Guidelines & Document Interventions  Outcome: Progressing   Problem: RH BOWEL ELIMINATION Goal: RH STG MANAGE BOWEL WITH ASSISTANCE Description: STG Manage Bowel with mod I Assistance. Outcome: Progressing Goal: RH STG MANAGE BOWEL W/MEDICATION W/ASSISTANCE Description: STG Manage Bowel with Medication with mod I Assistance. Outcome: Progressing   Problem: RH BLADDER ELIMINATION Goal: RH STG MANAGE BLADDER WITH ASSISTANCE Description: STG Manage Bladder With mod I Assistance Outcome: Progressing   Problem: RH SKIN INTEGRITY Goal: RH STG SKIN FREE OF INFECTION/BREAKDOWN Description: Patient will not have any signs of wound infection while on IP Reb Outcome: Progressing Goal: RH STG MAINTAIN SKIN INTEGRITY WITH ASSISTANCE Description: STG Maintain Skin Integrity With mod I Assistance. Outcome: Progressing   Problem: RH SAFETY Goal: RH STG ADHERE TO SAFETY PRECAUTIONS W/ASSISTANCE/DEVICE Description: STG Adhere to Safety Precautions With Minimal Assistance/Device. Outcome: Progressing   Problem: RH PAIN MANAGEMENT Goal: RH STG PAIN MANAGED AT OR BELOW PT'S PAIN GOAL Description: Pain scale <4/10 Outcome: Progressing   Problem: RH KNOWLEDGE DEFICIT GENERAL Goal: RH STG INCREASE KNOWLEDGE OF SELF CARE AFTER HOSPITALIZATION Outcome: Progressing   Problem: Consults Goal: RH LIMB LOSS PATIENT EDUCATION Description: Description: See Patient Education module for eduction  specifics. Outcome: Progressing Goal: Skin Care Protocol Initiated - if Braden Score 18 or less Description: If consults are not indicated, leave blank or document N/A Outcome: Progressing Goal: Nutrition Consult-if indicated Outcome: Progressing Goal: Diabetes Guidelines if Diabetic/Glucose > 140 Description: If diabetic or lab glucose is > 140 mg/dl - Initiate Diabetes/Hyperglycemia Guidelines & Document Interventions  Outcome: Progressing   Problem: RH SKIN INTEGRITY Goal: RH STG SKIN FREE OF INFECTION/BREAKDOWN Outcome: Progressing Goal: RH STG MAINTAIN SKIN INTEGRITY WITH ASSISTANCE Description: STG Maintain Skin Integrity With Assistance. Outcome: Progressing Goal: RH STG ABLE TO PERFORM INCISION/WOUND CARE W/ASSISTANCE Description: STG Able To Perform Incision/Wound Care With Assistance. Outcome: Progressing   Problem: RH SAFETY Goal: RH STG ADHERE TO SAFETY PRECAUTIONS W/ASSISTANCE/DEVICE Description: STG Adhere to Safety Precautions With Assistance/Device. Outcome: Progressing   Problem: RH PAIN MANAGEMENT Goal: RH STG PAIN MANAGED AT OR BELOW PT'S PAIN GOAL Outcome: Progressing

## 2019-09-29 NOTE — Care Management (Signed)
Patient Details  Name: [name] MRN: [id number] Date of Birth: Apr 06, 1966  Today's Date: 09/29/2019  Problem List:  Patient Active Problem List   Diagnosis Date Noted  . S/P bilateral BKA (below knee amputation) ([place]) 09/27/2019  . Acute osteomyelitis of left foot ([place]) 09/21/2019  . Subacute osteomyelitis, left ankle and foot ([place])   . Pressure injury of left heel, unstageable ([place]) 02/09/2019  . Hypoglycemia   . Diabetes mellitus type 2 in nonobese (HCC)   . Labile blood pressure   . Labile blood glucose   . Phantom limb pain ([place])   . CKD (chronic kidney disease), stage III   . Hypothyroidism   . Essential hypertension   . Chronic diastolic congestive heart failure ([place])   . Coronary artery disease involving coronary bypass graft of native heart without angina pectoris   . Poorly controlled type 2 diabetes mellitus with peripheral neuropathy ([place])   . Acute on chronic anemia   . History of below knee amputation, right ([place]) 12/31/2018  . S/P CABG x 3 06/28/2018  . Coronary artery disease involving native coronary artery of native heart with angina pectoris ([place]) 06/25/2018  . Type 2 diabetes mellitus with vascular disease ([place]) 06/24/2018  . Acute on chronic diastolic CHF (congestive heart failure) ([place]) 06/24/2018  . Non-ST elevation (NSTEMI) myocardial infarction ([place]) 06/24/2018  . Chest pain 06/23/2018  . Glaucoma 09/17/2017  . Diabetic retinopathy ([place]) 09/17/2017  . Otitis media 11/10/2016  . Decreased visual acuity 11/10/2016  . Spinal stenosis, lumbar region with neurogenic claudication 10/22/2016  . Other spondylosis with radiculopathy, lumbar region 10/13/2016  . Lipoma of abdominal wall 10/05/2016  . Severe protein-calorie malnutrition ([place]) 09/04/2016  . Anemia of chronic disease 09/04/2016  . Diabetic polyneuropathy associated with type 2 diabetes mellitus ([place]) 05/07/2016  . Orthostatic hypotension 05/07/2016  . Cerebrovascular disease 04/27/2016   . N&V (nausea and vomiting) 04/01/2016  . ICH (intracerebral hemorrhage) ([place]) 03/30/2016  . Visit for preventive health examination 05/21/2015  . Breast cancer screening 05/21/2015  . Arm lesion 07/02/2014  . Pain in limb 01/13/2014  . Neuropathic pain of both legs 01/13/2014  . Hip pain 01/12/2014  . Allergic rhinitis 03/10/2013  . Back pain 03/08/2013  . Depression 12/18/2012  . PVD (peripheral vascular disease) ([place]) 01/21/2012  . Hyperlipidemia 12/06/2010  . Overweight(278.02) 12/06/2010  . RESTLESS LEG SYNDROME 10/25/2010  . Migraine without aura 10/07/2010  . Hereditary and idiopathic peripheral neuropathy 10/07/2010  . Essential hypertension, benign 10/07/2010  . DISTURBANCE OF SKIN SENSATION 10/07/2010  . HEART MURMUR, HX OF 10/07/2010  . NEPHROLITHIASIS, HX OF 10/07/2010   Past Medical History:  Past Medical History:  Diagnosis Date  . Abscess of great toe, right   . Acute on chronic diastolic CHF (congestive heart failure) ([place]) 06/24/2018  . Acute osteomyelitis of toe, left ([place]) 09/05/2016  . Amputation of right great toe ([place]) 12/22/2018  . Anxiety   . Cataract    left - surgery to remove  . Cellulitis of foot, right 12/11/2018  . COMMON MIGRAINE 10/07/2010  . Decreased visual acuity 11/10/2016  . Depression 12/18/2012  . Diabetes mellitus type II, uncontrolled ([place]) 10/07/2010   Qualifier: Diagnosis of  By: Tiffany Blake MD, [name]    . Diabetic foot infection ([place]) 08/26/2016  . Diabetic infection of right foot ([place])   . Disturbance of skin sensation 10/07/2010  . Gangrene of right foot ([place])   . GERD (gastroesophageal reflux disease)   . Heart murmur   .  History of kidney stones    "years ago" - passed stones  . Hyperlipidemia 12/06/2010  . Hypertension   . Hypothyroidism   . Lipoma of abdominal wall 10/05/2016  . Overweight(278.02) 12/06/2010  . PERIPHERAL NEUROPATHY, FEET 10/07/2010  . PVD (peripheral vascular disease) ([place]) 01/21/2012  . RESTLESS LEG SYNDROME  10/25/2010  . Stroke [facility]) 2014, 2017   most recently in 2/17 - intracerebral hemorrhage   Past Surgical History:  Past Surgical History:  Procedure Laterality Date  . AMPUTATION Right 12/13/2018   Procedure: AMPUTATION RIGHT GREAT TOE, LOCAL RELOCATION OF TISSUE FOR WOUND CLOSURE 9cm x 3cm, VAC APPLICATION;  Surgeon: [name], MD;  Location: [facility];  Service: Orthopedics;  Laterality: Right;  . AMPUTATION Right 12/31/2018   Procedure: RIGHT BELOW KNEE AMPUTATION;  Surgeon: [name], MD;  Location: [facility];  Service: Orthopedics;  Laterality: Right;  . AMPUTATION Left 09/21/2019   Procedure: LEFT BELOW KNEE AMPUTATION;  Surgeon: [name], MD;  Location: [facility];  Service: Orthopedics;  Laterality: Left;  . AMPUTATION TOE Left 09/09/2016   Procedure: AMPUTATION OF LEFT GREAT TOE;  Surgeon: Milly Jakob, MD;  Location: [facility];  Service: Orthopedics;  Laterality: Left;  . BELOW KNEE LEG AMPUTATION Left 09/21/2019  . CARDIAC CATHETERIZATION  06/24/2018  . CORONARY ARTERY BYPASS GRAFT N/A 06/28/2018   Procedure: CORONARY ARTERY BYPASS GRAFTING (CABG) times  four, using left internal mammary artery, endoscopically harvested right saphenous vein, and harvested left radial artery;  Surgeon: [name], MD;  Location: [facility];  Service: Open Heart Surgery;  Laterality: N/A;  . ENDOVEIN HARVEST OF GREATER SAPHENOUS VEIN Right 06/28/2018   Procedure: ENDOVEIN HARVEST OF GREATER SAPHENOUS VEIN;  Surgeon: [name], MD;  Location: [facility];  Service: Open Heart Surgery;  Laterality: Right;  . EYE SURGERY Left 06/2017   [facility]  . LEFT HEART CATH AND CORONARY ANGIOGRAPHY N/A 06/24/2018   Procedure: LEFT HEART CATH AND CORONARY ANGIOGRAPHY;  Surgeon: Troy Sine, MD;  Location: [facility] CV LAB;  Service: Cardiovascular;  Laterality: N/A;  . RADIAL ARTERY HARVEST Left 06/28/2018   Procedure: RADIAL ARTERY HARVEST;  Surgeon: [name], MD;   Location: [facility];  Service: Open Heart Surgery;  Laterality: Left;  . TEE WITHOUT CARDIOVERSION N/A 06/28/2018   Procedure: TRANSESOPHAGEAL ECHOCARDIOGRAM (TEE);  Surgeon: [name], MD;  Location: [facility];  Service: Open Heart Surgery;  Laterality: N/A;  . WISDOM TOOTH EXTRACTION     Social History:  reports that she quit smoking about 14 years ago. She smoked 0.50 packs per day. She has never used smokeless tobacco. She reports previous drug use. She reports that she does not drink alcohol.  Family / [place] Patient Roles: Other (Comment)(lives with son and daughter in law) Children: Son: Economist Anticipated Caregiver: son and daughter in law Ability/Limitations of Caregiver: supervision/Min A Caregiver Availability: 24/7  Social History Preferred language: English Religion: None     Abuse/Neglect Abuse/Neglect Assessment Can Be Completed: Yes Physical Abuse: Denies Verbal Abuse: Denies Sexual Abuse: Denies Exploitation of patient/patient's resources: Denies Self-Neglect: Denies  Emotional Status Pt's affect, behavior and adjustment status: Flat affect and sad mood Psychiatric History: History of depression  Patient / Family Perceptions, Expectations & Goals Pt/Family understanding of illness & functional limitations: Patient has an understanding of her current illness and functional limitations ; previous amputation of right leg Premorbid pt/family roles/activities: Independent prior to admission with bathing, dressing, toileting, cooking  and cleaning Pt/family expectations/goals: Patient would like to be able to manage at home as much as possible on her own as son works everyday except Sunday and daughter in [employer] prn  [employer] available at discharge: Son and daughter in [employer] will provide transportation at discharge  Discharge Planning Living Arrangements: Children, Other relatives Support Systems: Children, Other relatives Type of  Residence: Private residence Does the patient have any problems obtaining your medications?: No Home Management: Patient managed home; cooking , cleaning, laundry, house care, etc Patient/Family Preliminary Plans: Return home with supervision by son and daughter in law as needed Social Work Anticipated Follow Up Needs: HH/OP Expected length of stay: 5-7 days  Clinical Impression The patient appears depressed, flat affect, and sad mood. Noted she has been through a lot of medical issues in a relatively short period of time and she is not that old. Previous heart surgery and right BKA. Reported she is familiar with the recovery process post amputation and feels like she has the equipment and can manage. She is currently not feeling well, nausea. Dorien Chihuahua B 09/29/2019, 12:28 PM

## 2019-09-29 NOTE — Care Management (Signed)
[facility] Individual Statement of Services  Patient Name:  [name]  Date:  09/29/2019  Welcome to the [facility].  Our goal is to provide you with an individualized program based on your diagnosis and situation, designed to meet your specific needs.  With this comprehensive rehabilitation program, you will be expected to participate in at least 3 hours of rehabilitation therapies Monday-Friday, with modified therapy programming on the weekends.  Your rehabilitation program will include the following services:  Physical Therapy (PT), Occupational Therapy (OT), 24 hour per day rehabilitation nursing, Therapeutic Recreaction (TR), Neuropsychology, Case Management (Social Worker), Rehabilitation Medicine, Nutrition Services and Pharmacy Services  Weekly team conferences will be held on Tuesdays to discuss your progress.  Your Social Worker will talk with you frequently to get your input and to update you on team discussions.  Team conferences with you and your family in attendance may also be held.  Expected length of stay: 5-7 days  Overall anticipated outcome: Supervision overall, minimal assistance for furniture transfers and car transfers  Depending on your progress and recovery, your program may change. Your Social Worker will coordinate services and will keep you informed of any changes. Your Social Worker's name and contact numbers are listed  below.  The following services may also be recommended but are not provided by the [facility]:    [place] will be made to provide these services after discharge if needed.  Arrangements include referral to agencies that provide these services.  Your insurance has been verified to be:  BC[employer] PPO Your primary doctor is:  Dr. Penni Homans  Pertinent information will be shared with your doctor and your  insurance company.  Social Worker:  [place], [place] or ([phone]   Information discussed with and copy given to patient by: Margarito Liner, 09/29/2019, 9:44 AM

## 2019-09-29 NOTE — Progress Notes (Signed)
[facility] PHYSICAL MEDICINE & REHABILITATION PROGRESS NOTE   Subjective/Complaints: Says she felt a little nauseous this morning but is feeling better now. Still feeling constipated but does not want any additional medications or prune juice. She has had an appetite but has not been eating much, says she will try to eat more lunch today. Has some phantom limb pain but does not want to increase her pain medication, prefers to work through it herself.   Objective:  No results found. Recent Labs    09/28/19 0557  WBC 9.6  HGB 10.7*  HCT 34.4*  PLT 640*   Recent Labs    09/28/19 0557  NA 136  K 4.0  CL 96*  CO2 28  GLUCOSE 187*  BUN 17  CREATININE 0.94  CALCIUM 8.8*    Intake/Output Summary (Last 24 hours) at 09/29/2019 [id number] Last data filed at 09/29/2019 0752 Gross per 24 hour  Intake 320 ml  Output 300 ml  Net 20 ml     Physical Exam: Vital Signs Blood pressure (!) 157/77, pulse 75, temperature 97.9 F (36.6 C), temperature source Oral, resp. rate 16, height 5\' 2"  (1.575 m), weight 59.9 kg, last menstrual period 02/12/2011, SpO2 100 %. General: Alert and oriented x 3, No apparent distress HEENT: Head is normocephalic, atraumatic, PERRLA, EOMI, sclera anicteric, oral mucosa pink and moist, dentition intact, ext ear canals clear,  Neck: Supple without JVD or lymphadenopathy Heart: Reg rate and rhythm. No murmurs rubs or gallops Chest: CTA bilaterally without wheezes, rales, or rhonchi; no distress Abdomen: Soft, non-tender, non-distended, bowel sounds positive. Extremities: No clubbing, cyanosis, or edema. Pulses are 2+ Neurological: Patient is alert no acute distress.  Oriented x3 and follows commands.  Skin: Left BKA with limb guard as well as a wound VAC in place.  Right BKA is well-healed, moving well.  Psych: Pt's affect is appropriate. Pt is cooperative  Assessment/Plan: 1. Functional deficits secondary to bilateral BKA which require 3+ hours per day of  interdisciplinary therapy in a comprehensive inpatient rehab setting.  Physiatrist is providing close team supervision and 24 hour management of active medical problems listed below.  Physiatrist and rehab team continue to assess barriers to discharge/monitor patient progress toward functional and medical goals  Care Tool:  Bathing    Body parts bathed by patient: Right arm, Left arm, Chest, Abdomen, Front perineal area, Buttocks, Right upper leg, Left upper leg, Face   Body parts bathed by helper: Buttocks, Right upper leg, Left upper leg Body parts n/a: Left lower leg, Right lower leg   Bathing assist Assist Level: Moderate Assistance - Patient 50 - 74%     Upper Body Dressing/Undressing Upper body dressing   What is the patient wearing?: Hospital gown only    Upper body assist Assist Level: Supervision/Verbal cueing    Lower Body Dressing/Undressing Lower body dressing    Lower body dressing activity did not occur: Safety/medical concerns What is the patient wearing?: Pants     Lower body assist       Toileting Toileting Toileting Activity did not occur (Clothing management and hygiene only): N/A (no void or bm)  Toileting assist Assist for toileting: Total Assistance - Patient < 25%     Transfers Chair/bed transfer  Transfers assist     Chair/bed transfer assist level: Minimal Assistance - Patient > 75%     Locomotion Ambulation   Ambulation assist   Ambulation activity did not occur: Safety/medical concerns(new L BKA and new R prosthesis, unable  to come to a full stance due to decreased strength/balance)          Walk 10 feet activity   Assist  Walk 10 feet activity did not occur: Safety/medical concerns(new L BKA and new R prosthesis, unable to come to a full stance due to decreased strength/balance)        Walk 50 feet activity   Assist Walk 50 feet with 2 turns activity did not occur: Safety/medical concerns(new L BKA and new R  prosthesis, unable to come to a full stance due to decreased strength/balance)         Walk 150 feet activity   Assist Walk 150 feet activity did not occur: Safety/medical concerns(new L BKA and new R prosthesis, unable to come to a full stand due to decreased strength/balance)         Walk 10 feet on uneven surface  activity   Assist Walk 10 feet on uneven surfaces activity did not occur: Safety/medical concerns(new L BKA and new R prosthesis, unable to come to a full stand due to decreased strength/balance)         Wheelchair     Assist Will patient use wheelchair at discharge?: Yes Type of Wheelchair: Manual    Wheelchair assist level: Supervision/Verbal cueing Max wheelchair distance: 10'(limited due to bathroom urgency)    Wheelchair 50 feet with 2 turns activity    Assist            Wheelchair 150 feet activity     Assist          Blood pressure (!) 157/77, pulse 75, temperature 97.9 F (36.6 C), temperature source Oral, resp. rate 16, height 5\' 2"  (1.575 m), weight 59.9 kg, last menstrual period 02/12/2011, SpO2 100 %.  Medical Problem List and Plan: 1.  Decreased functional mobility secondary to left transtibial amputation 09/21/2019 with wound VAC as directed as well as history of right BKA 12/31/2018.  Patient does have a right prosthesis.             -patient may shower but wound vac must be covered.              -ELOS/Goals: 10-14 days, modI in PT, OT, and I in SLP 2.  Antithrombotics: -DVT/anticoagulation: Patient has bilateral BKA             -antiplatelet therapy: Aspirin 81 mg daily 3. Pain Management: Oxycodone and Robaxin as needed 4. Mood: Provide emotional support             -antipsychotic agents: N/A 5. Neuropsych: This patient is capable of making decisions on her own behalf. 6. Skin/Wound Care: Routine skin checks 7. Fluids/Electrolytes/Nutrition: Routine in and outs with follow-up chemistries 8.  CKD stage III.   Baseline creatinine 1.21-2.13.  Follow-up chemistries 9.  CAD with CABG October 2019.  Continue aspirin therapy 10.  Acute on chronic diastolic congestive heart failure.  Monitor for any signs of fluid overload 11.  History of intracerebral hemorrhage February 2017.  Follow-up outpatient 12.  Diabetes mellitus with peripheral neuropathy.  Hemoglobin A1c 14.1.  SSI.  Patient on Lantus insulin 50 units twice daily prior to admission.  Resume as needed 13.  Hypertension.  Norvasc 5 mg daily. Systolics in last 2 days ranging from 141 to 171. Consider increasing Norvasc to 10mg  daily.   1/20: Will increase Norvasc to 10mg  for uncontrolled BP.  1/21: Continues to be elevated. Will add Hydralazine 10mg  daily.  14.  Hyperlipidemia.  Lipitor 15.  Constipation: Docusate 100mg  BID. Has not yet had BM. Consider changing to Senna-docusate BID.   1/20: Still constipated, will add daily Miralax  1/21: Still constipated, but does not want any additional medications or prune juice. Advised that her decreased eating could be contributing to constipation and constipation could be contributing to decreased eating- vicious cycle. Nausea can also be a player in this--she has prn zofran she can use as needed. She will try to eat more lunch today.  16. Itching: Will start hydroxyzine prn.    LOS: 2 days A FACE TO FACE EVALUATION WAS PERFORMED  Kharee Lesesne P Jannell Franta 09/29/2019, 9:22 AM

## 2019-09-29 NOTE — Progress Notes (Signed)
Occupational Therapy Session Note  Patient Details  Name: [name] MRN: [id number] Date of Birth: [birth year]  Today's Date: 09/29/2019 OT Individual Time: 1110-1120 OT Individual Time Calculation (min): 10 min    Short Term Goals: Week 1:  OT Short Term Goal 1 (Week 1): na STG = LTG  Skilled Therapeutic Interventions/Progress Updates:    attempted to see patient for therapy session at 1045 but she declined due to fatigue and nausea.  She stated that she would try again if I returned in 30 minutes.  At 1110 we reviewed plan of care, reviewed and practiced weight shifts w/c level, she continues to decline transfer out of w/c, light conditioning or stretching activities, self care...   Will attempt to make up missed time again this afternoon.  Patient remained seated in w/c, chair alarm set and call bell in reach.    Therapy Documentation Precautions:  Precautions Precautions: Fall Precaution Comments: wound vac Required Braces or Orthoses: (residual limb protector for LLE) Restrictions Weight Bearing Restrictions: Yes LLE Weight Bearing: Non weight bearing Other Position/Activity Restrictions: limb guard LLE General: General OT Amount of Missed Time: 65 Minutes Vital Signs:  Pain: Pain Assessment Pain Scale: 0-10 Pain Score: 0-No pain   Other Treatments:     Therapy/Group: Individual Therapy  Carlos Levering 09/29/2019, 12:27 PM

## 2019-09-29 NOTE — Progress Notes (Signed)
Recreational Therapy Session Note  Patient Details  Name: QUENESHA HELLER MRN: [id number] Date of Birth: 12/21/1965 Today's Date: 09/29/2019  Pain:  No c/o Attempted eval completion but pt with c/o nausea and feeling unwell.  Eval deferred.  Will attempt again tomorrow.  Wilbon Obenchain 09/29/2019, 11:58 AM

## 2019-09-29 NOTE — Progress Notes (Signed)
Physical Therapy Session Note  Patient Details  Name: [name] MRN: [id number] Date of Birth: 11-Feb-1966  Today's Date: 09/29/2019 PT Individual Time: [id number] PT Individual Time Calculation (min): 43 min   Short Term Goals: Week 1:  PT Short Term Goal 1 (Week 1): STG=LTG due to short ELOS  Skilled Therapeutic Interventions/Progress Updates:     Patient in bed upon PT arrival. Patient alert and agreeable to PT session, however did report feeling nauseous this morning. Patient reported 4-5/10 L residual limb pain during session, RN made aware. PT provided repositioning, rest breaks, and distraction as pain interventions throughout session. She requested to use the bed pan to void at beginning of session. PT encouraged her to get to the [facility] for mobility training and improved clearing of her bladder in a upright position, however, patient stated that she was feeling urgency and did not think she would make it to the bathroom.   Therapeutic Activity: Bed Mobility: Patient performed rolling with supervision and total A for bed pan placement. Patient was continent of bladder on the bed pan. NT notified and performed bladder scan after. Educated patient on performing relaxation techniques to relax pelvic floor during voiding to allow for total emptying. She performed UB and LB dressing with supervision bed level requiring min A x1 for pulling up pants in the back. She performed supine to sit with supervision with min use of bed rails. She donned her liner x2 to correct placement and her R prosthesis with cues and min A for locking into the socket. Transfers: Patient performed a squat pivot transfer to the L with mod-max A with her R prosthesis donned. Provided cues for hand placement, and head-hips relationship for proper technique and decreased assist with transfers. Patient stated after that she only transfers to the R at home.   Wheelchair Mobility:  Patient propelled wheelchair 132 feet  with supervision-mod A. Provided verbal cues for locking breaks and management of leg rests.   Patient in w/c in room at end of session with breaks locked, seat belt alarm set, and all needs within reach.    Therapy Documentation Precautions:  Precautions Precautions: Fall Precaution Comments: wound vac Required Braces or Orthoses: (residual limb protector for LLE) Restrictions Weight Bearing Restrictions: Yes LLE Weight Bearing: Non weight bearing Other Position/Activity Restrictions: limb guard LLE    Therapy/Group: Individual Therapy  Acel Natzke L Taryll Reichenberger PT, DPT  09/29/2019, 4:00 PM

## 2019-09-29 NOTE — Progress Notes (Signed)
Occupational Therapy Session Note  Patient Details  Name: [name] MRN: [id number] Date of Birth: September 14, 1965  Today's Date: 09/29/2019 OT Individual Time: 1250-1405 OT Individual Time Calculation (min): 75 min    Short Term Goals: Week 1:  OT Short Term Goal 1 (Week 1): na STG = LTG  Skilled Therapeutic Interventions/Progress Updates:    Patient seated in w/c, ready for therapy session but states that she continues with significant fatigue.  Sit pivot transfer to/from drop arm commode w/ right prosthesis and grab bars min a.  She is able to manage pants down with lateral leans and complete hygiene with set up, pants up mod A.  Sit pivot transfer w/c to bed with min a - cues for positioning.  She is able to doff prosthesis with CS.  UB dressing with set up, LB taking pants off with min A.  Re-positioned limb protector with max A.  She is able to pivot on bed with CS.   Reviewed anterior/posterior transfer from bed to and from w/c or commode.  Patient declined to practice at this time.  Unsupported long sit position on bed tolerated for 30 minutes with trunk control, theraband and theraputty activities for general conditioning and core strengthening.  She was able to make up 15 minutes of missed time from the early session but could not tolerate more at this time.  She remained in bed, bed alarm set and call bell in reach.    Therapy Documentation Precautions:  Precautions Precautions: Fall Precaution Comments: wound vac Required Braces or Orthoses: (residual limb protector for LLE) Restrictions Weight Bearing Restrictions: Yes LLE Weight Bearing: Non weight bearing Other Position/Activity Restrictions: limb guard LLE General: General OT Amount of Missed Time: 65 Minutes Vital Signs: Therapy Vitals Temp: 98.3 F (36.8 C) Temp Source: Oral Pulse Rate: 84 Resp: 17 BP: 126/83 Patient Position (if appropriate): Lying Oxygen Therapy SpO2: 100 % O2 Device: Room Air Pain: Pain  Assessment Pain Scale: 0-10 Pain Score: 2  Pain Type: Phantom pain Pain Location: Leg Pain Orientation: Left Pain Descriptors / Indicators: Discomfort Pain Onset: With Activity Pain Intervention(s): Repositioned   Other Treatments:     Therapy/Group: Individual Therapy  Carlos Levering 09/29/2019, 3:05 PM

## 2019-09-30 ENCOUNTER — Inpatient Hospital Stay (HOSPITAL_COMMUNITY): Payer: BC Managed Care – PPO | Admitting: Physical Therapy

## 2019-09-30 ENCOUNTER — Inpatient Hospital Stay (HOSPITAL_COMMUNITY): Payer: BC Managed Care – PPO

## 2019-09-30 ENCOUNTER — Inpatient Hospital Stay (HOSPITAL_COMMUNITY): Payer: BC Managed Care – PPO | Admitting: Occupational Therapy

## 2019-09-30 ENCOUNTER — Inpatient Hospital Stay (HOSPITAL_COMMUNITY): Payer: BC Managed Care – PPO | Admitting: *Deleted

## 2019-09-30 LAB — GLUCOSE, CAPILLARY
Glucose-Capillary: 162 mg/dL — ABNORMAL HIGH (ref 70–99)
Glucose-Capillary: 181 mg/dL — ABNORMAL HIGH (ref 70–99)
Glucose-Capillary: 133 mg/dL — ABNORMAL HIGH (ref 70–99)
Glucose-Capillary: 179 mg/dL — ABNORMAL HIGH (ref 70–99)

## 2019-09-30 MED ORDER — HYDRALAZINE HCL 10 MG PO TABS
10.0000 mg | ORAL_TABLET | Freq: Two times a day (BID) | ORAL | Status: DC
  Administered 2019-09-30 – 2019-10-12 (×24): 10 mg via ORAL
  Filled 2019-09-30 (×24): qty 1

## 2019-09-30 NOTE — Progress Notes (Signed)
Occupational Therapy Session Note  Patient Details  Name: [name] MRN: [id number] Date of Birth: 1966/07/16  Today's Date: 09/30/2019 OT Individual Time: 1500-1545 OT Individual Time Calculation (min): 45 min    Short Term Goals: Week 1:  OT Short Term Goal 1 (Week 1): na STG = LTG  Skilled Therapeutic Interventions/Progress Updates:    Patient seated in w/c, tired but willing to participate in therapy session.  She is able to propel her w/c to therapy gym, completed UB ergometer 2x 42minutes.  Upon return to room she became nauseous and vomited - nursing aware.  She stated that she felt better and requested to use the bathroom.  Sit pivot transfer w/c to/from toilet with min/mod A right prosthesis on, mod A for CM, she is able to complete hygiene after urination.  She opted to stay in the w/c at close of session.  Seat belt alarm set and call bell in reach.    Therapy Documentation Precautions:  Precautions Precautions: Fall Precaution Comments: wound vac Required Braces or Orthoses: (residual limb protector for LLE) Restrictions Weight Bearing Restrictions: Yes LLE Weight Bearing: Non weight bearing Other Position/Activity Restrictions: limb guard LLE General:   Vital Signs: Therapy Vitals Temp: 97.8 F (36.6 C) Pulse Rate: 89 Resp: 18 BP: 123/75 Patient Position (if appropriate): Sitting Oxygen Therapy SpO2: 100 % O2 Device: Room Air Pain: Pain Assessment Pain Scale: 0-10 Pain Score: 4  Pain Type: Phantom pain;Surgical pain Pain Location: Leg Pain Orientation: Left Pain Descriptors / Indicators: Aching Pain Onset: On-going Pain Intervention(s): Repositioned Other Treatments:     Therapy/Group: Individual Therapy  Carlos Levering 09/30/2019, 4:07 PM

## 2019-09-30 NOTE — Evaluation (Signed)
Recreational Therapy Assessment and Plan  Patient Details  Name: [name] MRN: [id number] Date of Birth: 1965/12/28 Today's Date: 09/30/2019  Rehab Potential:  Good ELOS:   5-7 days  Assessment  Problem List:      Patient Active Problem List   Diagnosis Date Noted  . S/P bilateral BKA (below knee amputation) ([place]) 09/27/2019  . Acute osteomyelitis of left foot ([place]) 09/21/2019  . Subacute osteomyelitis, left ankle and foot ([place])   . Pressure injury of left heel, unstageable ([place]) 02/09/2019  . Hypoglycemia   . Diabetes mellitus type 2 in nonobese (H[facility])   . Labile blood pressure   . Labile blood glucose   . Phantom limb pain ([place])   . CKD (chronic kidney disease), stage III   . Hypothyroidism   . Essential hypertension   . Chronic diastolic congestive heart failure ([place])   . Coronary artery disease involving coronary bypass graft of native heart without angina pectoris   . Poorly controlled type 2 diabetes mellitus with peripheral neuropathy ([place])   . Acute on chronic anemia   . History of below knee amputation, right ([place]) 12/31/2018  . S/P CABG x 3 06/28/2018  . Coronary artery disease involving native coronary artery of native heart with angina pectoris ([place]) 06/25/2018  . Type 2 diabetes mellitus with vascular disease ([place]) 06/24/2018  . Acute on chronic diastolic CHF (congestive heart failure) ([place]) 06/24/2018  . Non-ST elevation (NSTEMI) myocardial infarction ([place]) 06/24/2018  . Chest pain 06/23/2018  . Glaucoma 09/17/2017  . Diabetic retinopathy ([place]) 09/17/2017  . Otitis media 11/10/2016  . Decreased visual acuity 11/10/2016  . Spinal stenosis, lumbar region with neurogenic claudication 10/22/2016  . Other spondylosis with radiculopathy, lumbar region 10/13/2016  . Lipoma of abdominal wall 10/05/2016  . Severe protein-calorie malnutrition ([place]) 09/04/2016  . Anemia of chronic disease 09/04/2016  . Diabetic polyneuropathy associated with type 2 diabetes  mellitus ([place]) 05/07/2016  . Orthostatic hypotension 05/07/2016  . Cerebrovascular disease 04/27/2016  . N&V (nausea and vomiting) 04/01/2016  . ICH (intracerebral hemorrhage) ([place]) 03/30/2016  . Visit for preventive health examination 05/21/2015  . Breast cancer screening 05/21/2015  . Arm lesion 07/02/2014  . Pain in limb 01/13/2014  . Neuropathic pain of both legs 01/13/2014  . Hip pain 01/12/2014  . Allergic rhinitis 03/10/2013  . Back pain 03/08/2013  . Depression 12/18/2012  . PVD (peripheral vascular disease) ([place]) 01/21/2012  . Hyperlipidemia 12/06/2010  . Overweight(278.02) 12/06/2010  . RESTLESS LEG SYNDROME 10/25/2010  . Migraine without aura 10/07/2010  . Hereditary and idiopathic peripheral neuropathy 10/07/2010  . Essential hypertension, benign 10/07/2010  . DISTURBANCE OF SKIN SENSATION 10/07/2010  . HEART MURMUR, HX OF 10/07/2010  . NEPHROLITHIASIS, HX OF 10/07/2010   Past Medical History:      Past Medical History:  Diagnosis Date  . Abscess of great toe, right   . Acute on chronic diastolic CHF (congestive heart failure) ([place]) 06/24/2018  . Acute osteomyelitis of toe, left ([place]) 09/05/2016  . Amputation of right great toe ([place]) 12/22/2018  . Anxiety   . Cataract    left - surgery to remove  . Cellulitis of foot, right 12/11/2018  . COMMON MIGRAINE 10/07/2010  . Decreased visual acuity 11/10/2016  . Depression 12/18/2012  . Diabetes mellitus type II, uncontrolled ([place]) 10/07/2010   Qualifier: Diagnosis of By: Charlett Blake MD, [name]   . Diabetic foot infection ([place]) 08/26/2016  . Diabetic infection of right foot ([place])   . Disturbance of skin  sensation 10/07/2010  . Gangrene of right foot ([place])   . GERD (gastroesophageal reflux disease)   . Heart murmur   . History of kidney stones    "years ago" - passed stones  . Hyperlipidemia 12/06/2010  . Hypertension   . Hypothyroidism   . Lipoma of abdominal wall 10/05/2016  . Overweight(278.02) 12/06/2010  . PERIPHERAL  NEUROPATHY, FEET 10/07/2010  . PVD (peripheral vascular disease) ([place]) 01/21/2012  . RESTLESS LEG SYNDROME 10/25/2010  . Stroke [facility]) 2014, 2017   most recently in 2/17 - intracerebral hemorrhage   Past Surgical History:       Past Surgical History:  Procedure Laterality Date  . AMPUTATION Right 12/13/2018   Procedure: AMPUTATION RIGHT GREAT TOE, LOCAL RELOCATION OF TISSUE FOR WOUND CLOSURE 9cm x 3cm, VAC APPLICATION; Surgeon: [name], MD; Location: [place]; Service: Orthopedics; Laterality: Right;  . AMPUTATION Right 12/31/2018   Procedure: RIGHT BELOW KNEE AMPUTATION; Surgeon: [name], MD; Location: [place]; Service: Orthopedics; Laterality: Right;  . AMPUTATION Left 09/21/2019   Procedure: LEFT BELOW KNEE AMPUTATION; Surgeon: [name], MD; Location: [place]; Service: Orthopedics; Laterality: Left;  . AMPUTATION TOE Left 09/09/2016   Procedure: AMPUTATION OF LEFT GREAT TOE; Surgeon: Milly Jakob, MD; Location: [place]; Service: Orthopedics; Laterality: Left;  . BELOW KNEE LEG AMPUTATION Left 09/21/2019  . CARDIAC CATHETERIZATION  06/24/2018  . CORONARY ARTERY BYPASS GRAFT N/A 06/28/2018   Procedure: CORONARY ARTERY BYPASS GRAFTING (CABG) times four, using left internal mammary artery, endoscopically harvested right saphenous vein, and harvested left radial artery; Surgeon: [name], MD; Location: [place]; Service: Open Heart Surgery; Laterality: N/A;  . ENDOVEIN HARVEST OF GREATER SAPHENOUS VEIN Right 06/28/2018   Procedure: ENDOVEIN HARVEST OF GREATER SAPHENOUS VEIN; Surgeon: [name], MD; Location: [place]; Service: Open Heart Surgery; Laterality: Right;  . EYE SURGERY Left 06/2017   [facility]  . LEFT HEART CATH AND CORONARY ANGIOGRAPHY N/A 06/24/2018   Procedure: LEFT HEART CATH AND CORONARY ANGIOGRAPHY; Surgeon: Troy Sine, MD; Location: [place] [facility]; Service: Cardiovascular; Laterality: N/A;  . RADIAL ARTERY HARVEST Left  06/28/2018   Procedure: RADIAL ARTERY HARVEST; Surgeon: [name], MD; Location: [place]; Service: Open Heart Surgery; Laterality: Left;  . TEE WITHOUT CARDIOVERSION N/A 06/28/2018   Procedure: TRANSESOPHAGEAL ECHOCARDIOGRAM (TEE); Surgeon: [name], MD; Location: [place]; Service: Open Heart Surgery; Laterality: N/A;  . WISDOM TOOTH EXTRACTION     Assessment & Plan  Clinical Impression: Patient is a 54 y.o. year old female with history of anxiety, chronic kidney disease stage III, acute on chronic diastolic congestive heart failure, CAD with CABG October 2019, remote tobacco abuse 14 years ago, obesity with BMI 29.26 hypertension, intracerebral hemorrhage February 2017 secondary to hypertensive crisis, hyperlipidemia, diabetes mellitus and peripheral vascular disease. Patient well-known to rehab services after right BKA 12/31/2018 and received inpatient rehab services 01/05/2019 to 01/15/2019 and discharged to home. Per chart review patient lives with her children. 1 level home ramped entrance. She does have a prosthesis for right lower extremity that she has used on a limited basis primarily using wheelchair. Presented 09/21/2019 with gangrenous changes to left heel with significant odor and drainage. She had been on doxycycline for 2 weeks followed by Dr. [name] Given. No change with conservative care with increased ischemic changes and underwent left transtibial amputation 09/21/2019 per Dr. [name] Given. Hospital course pain management with wound VAC as directed. Patient transferred to CIR on 09/27/2019 .  Attempted eval completion x2, but pt with c/o nausea both sessions and with limited interest in conversation.   Assisted pt with repositioning, set up assist for breakfast and discussed the use of aromatherapy.    Discussion/edcuation  Included  type of oil used, how it would be delivered, desired effect and complications from use including headache and or skin irritation depending on how oil is  delivered.   2 drops of peppermint essential oil was applied to a cotton ball and placed on window seal (per pt request) for inhalation in the room.  Nursing alerted about pts use of aromatherapy and instructed to removecotton ball from room and discard it in the trash if pt requesting removal and/or complains of headache.  Pt stated understanding and is agreement with the above.   Plan  No further TR as pt is expected to discharge within the next few days.  Recommendations for other services: None   Discharge Criteria: Patient will be discharged from TR if patient refuses treatment 3 consecutive times without medical reason.  If treatment goals not met, if there is a change in medical status, if patient makes no progress towards goals or if patient is discharged from hospital.  The above assessment, treatment plan, treatment alternatives and goals were discussed and mutually agreed upon: by patient  Cole 09/30/2019, 12:14 PM

## 2019-09-30 NOTE — Progress Notes (Signed)
Patient complains of constipation during shift and n/v. Zofran given and patient encouraged to take stool softener, patient refuses and additional stool softener at time.

## 2019-09-30 NOTE — Progress Notes (Signed)
[place] PHYSICAL MEDICINE & REHABILITATION PROGRESS NOTE   Subjective/Complaints: Says she felt a little nauseous this morning but is feeling better now. Took 1 prn Zofran yesterday; does not want one now.  Still feeling constipated but does not want any additional medications or prune juice.  Has some residual limb pain and will be receiving oxycodone prn now.   Objective:  No results found. Recent Labs    09/28/19 0557  WBC 9.6  HGB 10.7*  HCT 34.4*  PLT 640*   Recent Labs    09/28/19 0557  NA 136  K 4.0  CL 96*  CO2 28  GLUCOSE 187*  BUN 17  CREATININE 0.94  CALCIUM 8.8*    Intake/Output Summary (Last 24 hours) at 09/30/2019 0902 Last data filed at 09/30/2019 0818 Gross per 24 hour  Intake 680 ml  Output 301 ml  Net 379 ml     Physical Exam: Vital Signs Blood pressure (!) 148/77, pulse 73, temperature 98.1 F (36.7 C), resp. rate 18, height 5\' 2"  (1.575 m), weight 59.9 kg, last menstrual period 02/12/2011, SpO2 98 %. General: Alert and oriented x 3, No apparent distress HEENT: Head is normocephalic, atraumatic, PERRLA, EOMI, sclera anicteric, oral mucosa pink and moist, dentition intact, ext ear canals clear,  Neck: Supple without JVD or lymphadenopathy Heart: Reg rate and rhythm. No murmurs rubs or gallops Chest: CTA bilaterally without wheezes, rales, or rhonchi; no distress Abdomen: Soft, non-tender, non-distended, bowel sounds positive. Extremities: No clubbing, cyanosis, or edema. Pulses are 2+ Neurological: Patient is alert no acute distress.  Oriented x3 and follows commands.  Skin: Left BKA with limb guard as well as a wound VAC in place.  Right BKA is well-healed, moving well.  Psych: Pt's affect is appropriate. Pt is cooperative  Assessment/Plan: 1. Functional deficits secondary to bilateral BKA which require 3+ hours per day of interdisciplinary therapy in a comprehensive inpatient rehab setting.  Physiatrist is providing close team supervision  and 24 hour management of active medical problems listed below.  Physiatrist and rehab team continue to assess barriers to discharge/monitor patient progress toward functional and medical goals  Care Tool:  Bathing    Body parts bathed by patient: Right arm, Left arm, Chest, Abdomen, Front perineal area, Buttocks, Right upper leg, Left upper leg, Face   Body parts bathed by helper: Buttocks, Right upper leg, Left upper leg Body parts n/a: Left lower leg, Right lower leg   Bathing assist Assist Level: Moderate Assistance - Patient 50 - 74%     Upper Body Dressing/Undressing Upper body dressing   What is the patient wearing?: Pull over shirt    Upper body assist Assist Level: Set up assist    Lower Body Dressing/Undressing Lower body dressing    Lower body dressing activity did not occur: Safety/medical concerns What is the patient wearing?: Pants     Lower body assist Assist for lower body dressing: Minimal Assistance - Patient > 75%     Toileting Toileting Toileting Activity did not occur (Clothing management and hygiene only): N/A (no void or bm)  Toileting assist Assist for toileting: Moderate Assistance - Patient 50 - 74%     Transfers Chair/bed transfer  Transfers assist     Chair/bed transfer assist level: Minimal Assistance - Patient > 75%     Locomotion Ambulation   Ambulation assist   Ambulation activity did not occur: Safety/medical concerns(new L BKA and new R prosthesis, unable to come to a full stance due to  decreased strength/balance)          Walk 10 feet activity   Assist  Walk 10 feet activity did not occur: Safety/medical concerns(new L BKA and new R prosthesis, unable to come to a full stance due to decreased strength/balance)        Walk 50 feet activity   Assist Walk 50 feet with 2 turns activity did not occur: Safety/medical concerns(new L BKA and new R prosthesis, unable to come to a full stance due to decreased  strength/balance)         Walk 150 feet activity   Assist Walk 150 feet activity did not occur: Safety/medical concerns(new L BKA and new R prosthesis, unable to come to a full stand due to decreased strength/balance)         Walk 10 feet on uneven surface  activity   Assist Walk 10 feet on uneven surfaces activity did not occur: Safety/medical concerns(new L BKA and new R prosthesis, unable to come to a full stand due to decreased strength/balance)         Wheelchair     Assist Will patient use wheelchair at discharge?: Yes Type of Wheelchair: Manual    Wheelchair assist level: Supervision/Verbal cueing Max wheelchair distance: 10'(limited due to bathroom urgency)    Wheelchair 50 feet with 2 turns activity    Assist            Wheelchair 150 feet activity     Assist          Blood pressure (!) 148/77, pulse 73, temperature 98.1 F (36.7 C), resp. rate 18, height 5\' 2"  (1.575 m), weight 59.9 kg, last menstrual period 02/12/2011, SpO2 98 %.  Medical Problem List and Plan: 1.  Decreased functional mobility secondary to left transtibial amputation 09/21/2019 with wound VAC as directed as well as history of right BKA 12/31/2018.  Patient does have a right prosthesis.             -patient may shower but wound vac must be covered.              -ELOS/Goals: 10-14 days, modI in PT, OT, and I in SLP 2.  Antithrombotics: -DVT/anticoagulation: Patient has bilateral BKA             -antiplatelet therapy: Aspirin 81 mg daily 3. Pain Management: Oxycodone and Robaxin as needed 4. Mood: Provide emotional support             -antipsychotic agents: N/A 5. Neuropsych: This patient is capable of making decisions on her own behalf. 6. Skin/Wound Care: Routine skin checks 7. Fluids/Electrolytes/Nutrition: Routine in and outs with follow-up chemistries 8.  CKD stage III.  Baseline creatinine 1.21-2.13.  Follow-up chemistries 9.  CAD with CABG October 2019.   Continue aspirin therapy 10.  Acute on chronic diastolic congestive heart failure.  Monitor for any signs of fluid overload 11.  History of intracerebral hemorrhage February 2017.  Follow-up outpatient 12.  Diabetes mellitus with peripheral neuropathy.  Hemoglobin A1c 14.1.  SSI.  Patient on Lantus insulin 50 units twice daily prior to admission.  Resume as needed 13.  Hypertension.  Norvasc 5 mg daily. Systolics in last 2 days ranging from 141 to 171. Consider increasing Norvasc to 10mg  daily.   1/20: Will increase Norvasc to 10mg  for uncontrolled BP.  1/21: Continues to be elevated. Will add Hydralazine 10mg  daily.   1/22: BP and HR still slightly elevated. Will increase Hydralazine to 10mg  BID.  14.  Hyperlipidemia.  Lipitor 15.  Constipation: Docusate 100mg  BID. Has not yet had BM. Consider changing to Senna-docusate BID.   1/20: Still constipated, will add daily Miralax  1/21: Still constipated, but does not want any additional medications or prune juice. Advised that her decreased eating could be contributing to constipation and constipation could be contributing to decreased eating- vicious cycle. Nausea can also be a player in this--she has prn zofran she can use as needed. She will try to eat more lunch today.  16. Itching: Will start hydroxyzine prn.    LOS: 3 days A FACE TO FACE EVALUATION WAS PERFORMED  Verlyn Lambert P Dorlisa Savino 09/30/2019, 9:02 AM

## 2019-09-30 NOTE — Progress Notes (Signed)
Physical Therapy Session Note  Patient Details  Name: [name] MRN: [id number] Date of Birth: [birth year]  Today's Date: 09/30/2019 PT Individual Time: 1130-1159 PT Individual Time Calculation (min): 29 min   Short Term Goals: Week 1:  PT Short Term Goal 1 (Week 1): STG=LTG due to short ELOS  Skilled Therapeutic Interventions/Progress Updates:   Received pt sitting in WC, pt agreeable to therapy, and reported 6/10 pain in L residual limb, but declined pain medication due to feeling nauseous. Pt did report that she isn't feeling good today and has been nauseous all morning. Pt reported urge to use restroom. Pt transferred stand<>pivot WC<>toilet min A without AD using grab bars. Pt continent of urine and able to perform hygiene management with supervision. Pt transferred scoot<>pivot from commode<>WC min A. Pt transferred sit<>stand min A with bilateral UE support on grab bars and therapist donned pants max A due to pt stating "can you just pull them up for me this time, I don't feel well". Pt agreeable to seated exercises and performed R hip flexion with grn TB x12 and no resistance on LLE x12. Pt performed LLE hip abduction x10 and hamstring curls with grn TB on R LE x12. Concluded session with pt sitting in WC, needs within reach, and seatbelt alarm on.    Therapy Documentation Precautions:  Precautions Precautions: Fall Precaution Comments: wound vac Required Braces or Orthoses: (residual limb protector for LLE) Restrictions Weight Bearing Restrictions: Yes LLE Weight Bearing: Non weight bearing Other Position/Activity Restrictions: limb guard LLE  Therapy/Group: Individual Therapy Alfonse Alpers PT, DPT   09/30/2019, 7:37 AM

## 2019-09-30 NOTE — Progress Notes (Signed)
Occupational Therapy Session Note  Patient Details  Name: [name] MRN: [id number] Date of Birth: 07/15/1966  Today's Date: 09/30/2019 OT Individual Time: [id number] OT Individual Time Calculation (min): 56 min    Short Term Goals: Week 1:  OT Short Term Goal 1 (Week 1): na STG = LTG  Skilled Therapeutic Interventions/Progress Updates:    Patient seated edge of bed, finishing breakfast.  She is able to donn right prosthesis with min A.  Sit pivot transfer bed to w/c min A.  She completed bathing w/c level - set up for UB, mod A for LB.  UB dressing with set up, LB dressing mod A.  Grooming tasks mod I.   She is able to propel w/c to and from therapy gym with CS.  Competed trunk mobility activities to improve lateral lean and hip mobility for increased ability to perform self care from a seated position.  Completed UB conditioning activities with w/c push ups (she is unable to clear bottom at this time)  She remained seated in the w/c at close of session, seat beltalarm set and call bell in reach.    Therapy Documentation Precautions:  Precautions Precautions: Fall Precaution Comments: wound vac Required Braces or Orthoses: (residual limb protector for LLE) Restrictions Weight Bearing Restrictions: Yes LLE Weight Bearing: Non weight bearing Other Position/Activity Restrictions: limb guard LLE General:   Vital Signs:  Pain: Pain Assessment Pain Scale: 0-10 Pain Score: 2  Pain Type: Phantom pain Pain Location: Leg Pain Orientation: Left Pain Descriptors / Indicators: Discomfort Pain Intervention(s): Repositioned   Other Treatments:     Therapy/Group: Individual Therapy  Carlos Levering 09/30/2019, 12:18 PM

## 2019-09-30 NOTE — Progress Notes (Signed)
Recreational Therapy Session Note  Patient Details  Name: [name] MRN: [id number] Date of Birth: [birth year] Today's Date: 09/30/2019  Pain: no c/o Skilled Therapeutic Interventions/Progress Updates: Attempted eval completion today and pt with c/o nausea again this morning, empty emesis bag in hand.  Assisted pt with repositioning.  Discussed the use of aromatherapy as a therapy intervention including type of oil used, how it would be delivered, desired effect and complications from use including headache and or skin irritation depending on how oil is delivered.  2 drops of peppermint essential oil was applied to a cotton ball & placed on bedside table for inhalation in the room.  Nursing alerted about pts use of aromatherapy and instructed to remove cotton ball from the room with essential oil applied and discard it in the trash if pt requesting removal and/or complains of headache.  Pt stated understanding and is agreement with the above.   Therapy/Group: Individual Therapy   Stephany Poorman 09/30/2019, 8:58 AM

## 2019-09-30 NOTE — IPOC Note (Signed)
Overall Plan of Care [facility]) Patient Details Name: [name] MRN: [id number] DOB: [birth year]  Admitting Diagnosis: S/P bilateral BKA (below knee amputation) [facility])  Hospital Problems: Principal Problem:   S/P bilateral BKA (below knee amputation) ([place])     Functional Problem List: Nursing Behavior, Bladder, Safety, Bowel, Edema, Skin Integrity, Endurance, Medication Management, Motor, Nutrition, Pain, Perception  PT Balance, Behavior, Edema, Endurance, Motor, Nutrition, Pain, Perception, Safety, Sensory, Skin Integrity  OT Balance, Endurance, Skin Integrity  SLP    TR         Basic ADL's: OT Grooming, Bathing, Dressing, Toileting     Advanced  ADL's: OT Simple Meal Preparation     Transfers: PT Bed Mobility, Bed to Chair, Car, [employer], Floor  OT Toilet     Locomotion: PT Ambulation, Emergency planning/management officer, Stairs     Additional Impairments: OT    SLP        TR      Anticipated Outcomes Item Anticipated Outcome  Self Feeding independent  Swallowing      Basic self-care  supervision  Toileting  supervision   Bathroom Transfers supervision  Bowel/Bladder  Patient will manage bowel/bladder with minimal assistance.  Transfers  supervision using LRAD  Locomotion  supervision w/c level, initiate ambulation with R prosthesis as tolerated  Communication     Cognition     Pain  Patient will have pain score <3 by discharge.  Safety/Judgment  Patient will verbalize ways to prevent falls.   Therapy Plan: PT Intensity: Minimum of 1-2 x/day ,45 to 90 minutes PT Frequency: 5 out of 7 days PT Duration Estimated Length of Stay: 5-7 days OT Intensity: Minimum of 1-2 x/day, 45 to 90 minutes OT Frequency: 5 out of 7 days OT Duration/Estimated Length of Stay: 5-7 days     Due to the current state of emergency, patients may not be receiving their 3-hours of Medicare-mandated therapy.   Team Interventions: Nursing Interventions Patient/Family Education, Pain  Management, Bladder Management, Medication Management, Discharge Planning, Bowel Management, Skin Care/Wound Management, Psychosocial Support, Disease Management/Prevention  PT interventions Ambulation/gait training, Discharge planning, Functional mobility training, Psychosocial support, Therapeutic Activities, Visual/perceptual remediation/compensation, Balance/vestibular training, Disease management/prevention, Neuromuscular re-education, Skin care/wound management, Therapeutic Exercise, Wheelchair propulsion/positioning, DME/adaptive equipment instruction, Pain management, Splinting/orthotics, UE/LE Strength taining/ROM, Community reintegration, Technical sales engineer stimulation, Patient/family education, IT trainer, UE/LE Coordination activities  OT Interventions Training and development officer, Discharge planning, Self Care/advanced ADL retraining, Therapeutic Activities, Functional mobility training, Patient/family education, Skin care/wound managment, Therapeutic Exercise, Community reintegration, Engineer, drilling, UE/LE Strength taining/ROM, Wheelchair propulsion/positioning  SLP Interventions    TR Interventions    SW/CM Interventions Discharge Planning, Disease Management/Prevention, Psychosocial Support, Patient/Family Education   Barriers to Discharge MD  Medical stability  Nursing Weight bearing restrictions    PT Weight bearing restrictions, Incontinence, Behavior NWB L LE, new incontinence since surgery, requires significant residual limb and prosthesis care education and functional mobility  OT      SLP      SW       Team Discharge Planning: Destination: PT-Home ,OT- Home , SLP-  Projected Follow-up: PT-Home health PT, OT-  Home health OT, SLP-  Projected Equipment Needs: PT- , OT- To be determined, SLP-  Equipment Details: PT-Patient has a RW and w/c from last admission, will need L amputee pad for w/c, OT-may need drop arm commode due to 2nd LB  amputation Patient/family involved in discharge planning: PT- Patient,  OT-Patient, SLP-   MD ELOS: 10-14 days Medical Rehab Prognosis:  Excellent Assessment: [name] is participating well with PT and OT but her sessions have been limited by fatigue and nausea. She also complains of pain, constipation, and depression and appears uncomfortable but she is very resistant to trying new medications. Currently able to propel WC with ModA and dress herself MinA. BP has also been elevated and is now under better control with addition of medication.    See Team Conference Notes for weekly updates to the plan of care

## 2019-09-30 NOTE — Progress Notes (Signed)
Physical Therapy Session Note  Patient Details  Name: [name] MRN: [id number] Date of Birth: 11-Mar-1966  Today's Date: 09/30/2019 PT Individual Time: 1305-1402 PT Individual Time Calculation (min): 57 min   Short Term Goals: Week 1:  PT Short Term Goal 1 (Week 1): STG=LTG due to short ELOS  Skilled Therapeutic Interventions/Progress Updates:   Pt received sitting in WC and agreeable to PT. PT treatment session focused on safety with WC mobility and UE/LE strengthening.   WC mobility x 321f, 157f sand 20078fith supervision assist for safety and min cues for doorway management as well as cues for improved body mechanics to reduce risk of shoulder pain.   PT instructed pt in seated UE therex. Ball taps with 2# bar weight. 2  x1 min Mid row with level 2 tband x 15 Overhead ball toss 2 x 15 Bicep curl with level 2 tband  X 15 trcep extension. With level 2 tband x 15 [facility]   LE therex for the RLE, pt reports 7/10 pain the anterior aspect of the LLE limiting ability to perform therex.  LAQ, with level 2 tband x 12 hip abduction with manual resistance x 15 HS curl. X 10 with level 2 tband Hip extension. From seated position with level 2 tband  Sit<>stand x 1 in parallel bars min-mod assist from. Pt declines additional transfers due to pain in the R residual limb.   Patient returned to room and [facility] and all needs met.        Therapy Documentation Precautions:  Precautions Precautions: Fall Precaution Comments: wound vac Required Braces or Orthoses: (residual limb protector for LLE) Restrictions Weight Bearing Restrictions: Yes LLE Weight Bearing: Non weight bearing Other Position/Activity Restrictions: limb guard LLE Vital Signs: Therapy Vitals Temp: 97.8 F (36.6 C) Pulse Rate: 89 Resp: 18 BP: 123/75 Patient Position (if appropriate): Sitting Oxygen Therapy SpO2: 100 % O2 Device: Room Air Pain: Pain  Assessment Pain Scale: 0-10 Pain Score: 5  Pain Type: Acute pain;Surgical pain Pain Location: Leg Pain Orientation: Left Pain Descriptors / Indicators: Aching;Discomfort Pain Intervention(s): Repositioned  Therapy/Group: Individual Therapy  AusLorie Phenix22/2021, 2:05 PM

## 2019-09-30 NOTE — Plan of Care (Signed)
  Problem: Consults Goal: RH GENERAL PATIENT EDUCATION Description: See Patient Education module for education specifics. Outcome: Progressing Goal: Skin Care Protocol Initiated - if Braden Score 18 or less Description: If consults are not indicated, leave blank or document N/A Outcome: Progressing Goal: Nutrition Consult-if indicated Outcome: Progressing Goal: Diabetes Guidelines if Diabetic/Glucose > 140 Description: If diabetic or lab glucose is > 140 mg/dl - Initiate Diabetes/Hyperglycemia Guidelines & Document Interventions  Outcome: Progressing   Problem: RH BOWEL ELIMINATION Goal: RH STG MANAGE BOWEL WITH ASSISTANCE Description: STG Manage Bowel with mod I Assistance. Outcome: Progressing Goal: RH STG MANAGE BOWEL W/MEDICATION W/ASSISTANCE Description: STG Manage Bowel with Medication with mod I Assistance. Outcome: Progressing   Problem: RH BLADDER ELIMINATION Goal: RH STG MANAGE BLADDER WITH ASSISTANCE Description: STG Manage Bladder With mod I Assistance Outcome: Progressing   Problem: RH SKIN INTEGRITY Goal: RH STG SKIN FREE OF INFECTION/BREAKDOWN Description: Patient will not have any signs of wound infection while on IP Reb Outcome: Progressing Goal: RH STG MAINTAIN SKIN INTEGRITY WITH ASSISTANCE Description: STG Maintain Skin Integrity With mod I Assistance. Outcome: Progressing   Problem: RH SAFETY Goal: RH STG ADHERE TO SAFETY PRECAUTIONS W/ASSISTANCE/DEVICE Description: STG Adhere to Safety Precautions With Minimal Assistance/Device. Outcome: Progressing   Problem: RH PAIN MANAGEMENT Goal: RH STG PAIN MANAGED AT OR BELOW PT'S PAIN GOAL Description: Pain scale <4/10 Outcome: Progressing   Problem: RH KNOWLEDGE DEFICIT GENERAL Goal: RH STG INCREASE KNOWLEDGE OF SELF CARE AFTER HOSPITALIZATION Outcome: Progressing   Problem: Consults Goal: RH LIMB LOSS PATIENT EDUCATION Description: Description: See Patient Education module for eduction  specifics. Outcome: Progressing Goal: Skin Care Protocol Initiated - if Braden Score 18 or less Description: If consults are not indicated, leave blank or document N/A Outcome: Progressing Goal: Nutrition Consult-if indicated Outcome: Progressing Goal: Diabetes Guidelines if Diabetic/Glucose > 140 Description: If diabetic or lab glucose is > 140 mg/dl - Initiate Diabetes/Hyperglycemia Guidelines & Document Interventions  Outcome: Progressing   Problem: RH SKIN INTEGRITY Goal: RH STG SKIN FREE OF INFECTION/BREAKDOWN Outcome: Progressing Goal: RH STG MAINTAIN SKIN INTEGRITY WITH ASSISTANCE Description: STG Maintain Skin Integrity With Assistance. Outcome: Progressing Goal: RH STG ABLE TO PERFORM INCISION/WOUND CARE W/ASSISTANCE Description: STG Able To Perform Incision/Wound Care With Assistance. Outcome: Progressing   Problem: RH SAFETY Goal: RH STG ADHERE TO SAFETY PRECAUTIONS W/ASSISTANCE/DEVICE Description: STG Adhere to Safety Precautions With Assistance/Device. Outcome: Progressing   Problem: RH PAIN MANAGEMENT Goal: RH STG PAIN MANAGED AT OR BELOW PT'S PAIN GOAL Outcome: Progressing

## 2019-09-30 NOTE — Plan of Care (Signed)
Problem: Consults Goal: RH GENERAL PATIENT EDUCATION Description: See Patient Education module for education specifics. 09/30/2019 1545 by Adria Devon, LPN Outcome: Progressing 09/30/2019 1544 by Ellison Carwin A, LPN Outcome: Progressing Goal: Skin Care Protocol Initiated - if Braden Score 18 or less Description: If consults are not indicated, leave blank or document N/A 09/30/2019 1545 by Adria Devon, LPN Outcome: Progressing 09/30/2019 1544 by Ellison Carwin A, LPN Outcome: Progressing Goal: Nutrition Consult-if indicated 09/30/2019 1545 by Adria Devon, LPN Outcome: Progressing 09/30/2019 1544 by Ellison Carwin A, LPN Outcome: Progressing Goal: Diabetes Guidelines if Diabetic/Glucose > 140 Description: If diabetic or lab glucose is > 140 mg/dl - Initiate Diabetes/Hyperglycemia Guidelines & Document Interventions  09/30/2019 1545 by Ellison Carwin A, LPN Outcome: Progressing 09/30/2019 1544 by Ellison Carwin A, LPN Outcome: Progressing   Problem: RH BOWEL ELIMINATION Goal: RH STG MANAGE BOWEL WITH ASSISTANCE Description: STG Manage Bowel with mod I Assistance. 09/30/2019 1545 by Adria Devon, LPN Outcome: Progressing 09/30/2019 1544 by Adria Devon, LPN Outcome: Progressing Goal: RH STG MANAGE BOWEL W/MEDICATION W/ASSISTANCE Description: STG Manage Bowel with Medication with mod I Assistance. 09/30/2019 1545 by Adria Devon, LPN Outcome: Progressing 09/30/2019 1544 by Adria Devon, LPN Outcome: Progressing   Problem: RH BLADDER ELIMINATION Goal: RH STG MANAGE BLADDER WITH ASSISTANCE Description: STG Manage Bladder With mod I Assistance 09/30/2019 1545 by Adria Devon, LPN Outcome: Progressing 09/30/2019 1544 by Ellison Carwin A, LPN Outcome: Progressing   Problem: RH SKIN INTEGRITY Goal: RH STG SKIN FREE OF INFECTION/BREAKDOWN Description: Patient will not have any signs of wound infection while on IP Reb 09/30/2019  1545 by Ellison Carwin A, LPN Outcome: Progressing 09/30/2019 1544 by Adria Devon, LPN Outcome: Progressing Goal: RH STG MAINTAIN SKIN INTEGRITY WITH ASSISTANCE Description: STG Maintain Skin Integrity With mod I Assistance. 09/30/2019 1545 by Adria Devon, LPN Outcome: Progressing 09/30/2019 1544 by Adria Devon, LPN Outcome: Progressing   Problem: RH SAFETY Goal: RH STG ADHERE TO SAFETY PRECAUTIONS W/ASSISTANCE/DEVICE Description: STG Adhere to Safety Precautions With Minimal Assistance/Device. 09/30/2019 1545 by Adria Devon, LPN Outcome: Progressing 09/30/2019 1544 by Adria Devon, LPN Outcome: Progressing   Problem: RH PAIN MANAGEMENT Goal: RH STG PAIN MANAGED AT OR BELOW PT'S PAIN GOAL Description: Pain scale <4/10 09/30/2019 1545 by Adria Devon, LPN Outcome: Progressing 09/30/2019 1544 by Adria Devon, LPN Outcome: Progressing   Problem: RH KNOWLEDGE DEFICIT GENERAL Goal: RH STG INCREASE KNOWLEDGE OF SELF CARE AFTER HOSPITALIZATION 09/30/2019 1545 by Adria Devon, LPN Outcome: Progressing 09/30/2019 1544 by Adria Devon, LPN Outcome: Progressing   Problem: Consults Goal: RH LIMB LOSS PATIENT EDUCATION Description: Description: See Patient Education module for eduction specifics. 09/30/2019 1545 by Adria Devon, LPN Outcome: Progressing 09/30/2019 1544 by Ellison Carwin A, LPN Outcome: Progressing Goal: Skin Care Protocol Initiated - if Braden Score 18 or less Description: If consults are not indicated, leave blank or document N/A 09/30/2019 1545 by Adria Devon, LPN Outcome: Progressing 09/30/2019 1544 by Ellison Carwin A, LPN Outcome: Progressing Goal: Nutrition Consult-if indicated 09/30/2019 1545 by Adria Devon, LPN Outcome: Progressing 09/30/2019 1544 by Ellison Carwin A, LPN Outcome: Progressing Goal: Diabetes Guidelines if Diabetic/Glucose > 140 Description: If diabetic or lab glucose  is > 140 mg/dl - Initiate Diabetes/Hyperglycemia Guidelines & Document Interventions  09/30/2019 1545 by Ellison Carwin A, LPN Outcome: Progressing 09/30/2019 1544 by Ellison Carwin A, LPN Outcome: Progressing   Problem: RH SKIN INTEGRITY Goal: RH STG  SKIN FREE OF INFECTION/BREAKDOWN 09/30/2019 1545 by Ellison Carwin A, LPN Outcome: Progressing 09/30/2019 1544 by Adria Devon, LPN Outcome: Progressing Goal: RH STG MAINTAIN SKIN INTEGRITY WITH ASSISTANCE Description: STG Maintain Skin Integrity With Assistance. 09/30/2019 1545 by Adria Devon, LPN Outcome: Progressing 09/30/2019 1544 by Adria Devon, LPN Outcome: Progressing Goal: RH STG ABLE TO PERFORM INCISION/WOUND CARE W/ASSISTANCE Description: STG Able To Perform Incision/Wound Care With Assistance. 09/30/2019 1545 by Adria Devon, LPN Outcome: Progressing 09/30/2019 1544 by Adria Devon, LPN Outcome: Progressing   Problem: RH SAFETY Goal: RH STG ADHERE TO SAFETY PRECAUTIONS W/ASSISTANCE/DEVICE Description: STG Adhere to Safety Precautions With Assistance/Device. 09/30/2019 1545 by Adria Devon, LPN Outcome: Progressing 09/30/2019 1544 by Ellison Carwin A, LPN Outcome: Progressing   Problem: RH PAIN MANAGEMENT Goal: RH STG PAIN MANAGED AT OR BELOW PT'S PAIN GOAL 09/30/2019 1545 by Adria Devon, LPN Outcome: Progressing 09/30/2019 1544 by Adria Devon, LPN Outcome: Progressing

## 2019-10-01 ENCOUNTER — Inpatient Hospital Stay (HOSPITAL_COMMUNITY): Payer: BC Managed Care – PPO | Admitting: Physical Therapy

## 2019-10-01 ENCOUNTER — Inpatient Hospital Stay (HOSPITAL_COMMUNITY): Payer: BC Managed Care – PPO | Admitting: Occupational Therapy

## 2019-10-01 DIAGNOSIS — E1169 Type 2 diabetes mellitus with other specified complication: Secondary | ICD-10-CM

## 2019-10-01 DIAGNOSIS — E669 Obesity, unspecified: Secondary | ICD-10-CM

## 2019-10-01 DIAGNOSIS — I10 Essential (primary) hypertension: Secondary | ICD-10-CM

## 2019-10-01 DIAGNOSIS — K5901 Slow transit constipation: Secondary | ICD-10-CM

## 2019-10-01 LAB — GLUCOSE, CAPILLARY
Glucose-Capillary: 187 mg/dL — ABNORMAL HIGH (ref 70–99)
Glucose-Capillary: 149 mg/dL — ABNORMAL HIGH (ref 70–99)
Glucose-Capillary: 172 mg/dL — ABNORMAL HIGH (ref 70–99)
Glucose-Capillary: 149 mg/dL — ABNORMAL HIGH (ref 70–99)

## 2019-10-01 MED ORDER — SENNOSIDES-DOCUSATE SODIUM 8.6-50 MG PO TABS
2.0000 | ORAL_TABLET | Freq: Two times a day (BID) | ORAL | Status: DC
  Administered 2019-10-01 – 2019-10-11 (×17): 2 via ORAL
  Filled 2019-10-01 (×22): qty 2

## 2019-10-01 NOTE — Progress Notes (Signed)
Physical Therapy Session Note  Patient Details  Name: [name] MRN: [id number] Date of Birth: 03/20/1966  Today's Date: 10/01/2019 PT Individual Time: 1346-1446 PT Individual Time Calculation (min): 60 min   Short Term Goals: Week 1:  PT Short Term Goal 1 (Week 1): STG=LTG due to short ELOS  Skilled Therapeutic Interventions/Progress Updates:   Pt presents sitting in w/c, still c/o nausea/fatigue.  Pt agreeable to therapy, to do what she can.  Pt negotiated w/c multiple trials w/ supervision up to 150' including turns.  Pt refusing transfers as c/o nausea.  Pt performed seated cervical flex/ext, scapular pinches  3 x 10 as c/o soreness in neck.  Educated pt on seated posture to improve shoulder/chest expansion to decrease pain.  Pt performed seated UE shuttle game, catching beach ball w/o trunk support w/ multiple rest breaks d/t fatigue.  Pt returned to room and all needs within reach, chair alarm on.     Therapy Documentation Precautions:  Precautions Precautions: Fall Precaution Comments: wound vac Required Braces or Orthoses: (residual limb protector for LLE) Restrictions Weight Bearing Restrictions: Yes LLE Weight Bearing: Non weight bearing Other Position/Activity Restrictions: limb guard LLE General:   Vital Signs: Therapy Vitals Temp: 98.3 F (36.8 C) Temp Source: Oral Pulse Rate: 78 Resp: 19 BP: (!) 141/81 Patient Position (if appropriate): Sitting Oxygen Therapy SpO2: 100 % O2 Device: Room Air Pain: 4/10 per pt report, pain meds given this AM.         Therapy/Group: Individual Therapy  Ladoris Gene 10/01/2019, 4:35 PM

## 2019-10-01 NOTE — Progress Notes (Signed)
Occupational Therapy Session Note  Patient Details  Name: [name] MRN: [id number] Date of Birth: [birth year]  Today's Date: 10/01/2019 OT Individual Time: [id number] OT Individual Time Calculation (min): 8 min    Short Term Goals: Week 1:  OT Short Term Goal 1 (Week 1): na STG = LTG  Skilled Therapeutic Interventions/Progress Updates: Patient attempted to participate w/c level though initially she stated she did not feel good and was in much pain. She stated she needed to stop working and felt really badly.  RN had given pain meds before the session.  She was further assisted positionionally in her w/c and offered pillows to prop her head.   She stated she did not want to transfer to bed or chair.   She was offered sugar free gingerale for the nausea and overal complaints of not feeling well.  As well, she had an emesis bag nearby.   RN already aware of patient's not feeling well.  Will offer OT therapy next scheduled date     Therapy Documentation Precautions:  Precautions Precautions: Fall Precaution Comments: wound vac Required Braces or Orthoses: (residual limb protector for LLE) Restrictions Weight Bearing Restrictions: Yes LLE Weight Bearing: Non weight bearing Other Position/Activity Restrictions: limb guard LLE General: General OT Amount of Missed Time: 37 Minutes  Ill and in pain   Therapy/Group: Individual Therapy  Herschell Dimes 10/01/2019, 1:31 PM

## 2019-10-01 NOTE — Progress Notes (Signed)
Physical Therapy Session Note  Patient Details  Name: [name] MRN: [id number] Date of Birth: November 28, 1965  Today's Date: 10/01/2019 PT Individual Time: 0802-0901 PT Individual Time Calculation (min): 59 min   Short Term Goals: Week 1:  PT Short Term Goal 1 (Week 1): STG=LTG due to short ELOS  Skilled Therapeutic Interventions/Progress Updates: Pt presents supine in bed and agreeable to PT.  Pt states continued nausea and keeps emesis bag close.  Pt required mod I to roll side to side in bed to don shorts over limb protector and wound vac tube through left leg.  Pt requires occasional min A to completely pull pants up over hips when rolling.  Supervision transfer sup to sit at EOB.  Pt dons gel sleeve and right BKA prosthesis w/ set-up assistance at EOB.  Pt performed modified SPT bed > w/c w/ min A and occ verbal cues for safety.  Pt negotiates w/c multiple trials up to 150' in hallways, negotiating obstacles w/ supervision.  Pt requires increased verbal cues in crowded spaces, especially room.  Pt refuses to transfer to mat table in AM d/t nausea.  Pt agreeable to UE and LE there ex to increase strength.  Pt performed seated overhead press, manually resisted chest press and lat row, 3 x 10 w/ rest breaks.  Pt performed there ex w/o trunk support to increase balance.  Pt performed bilateral abd/add in w/c w/o trunk support.  Pt negotiated w/c to room and wishes to remain in w/c.  All needs w/in reach and alarm on.     Therapy Documentation Precautions:  Precautions Precautions: Fall Precaution Comments: wound vac Required Braces or Orthoses: (residual limb protector for LLE) Restrictions Weight Bearing Restrictions: Yes LLE Weight Bearing: Non weight bearing Other Position/Activity Restrictions: limb guard LLE General:   Vital Signs: Therapy Vitals Temp: 97.9 F (36.6 C) Temp Source: Axillary Pulse Rate: 74 Resp: 16 BP: (!) 155/84 Patient Position (if appropriate):  Lying Oxygen Therapy SpO2: 96 % O2 Device: Room Air Pain:  0/10 prior to rx, but 4/10 and throbbing to left residual limb w/ activity.  Pre-medicated approx. 30' prior to PT, per pt report. Pain Assessment Pain Scale: 0-10 Pain Score: 5  Pain Location: Leg Pain Orientation: Left Pain Descriptors / Indicators: Aching Pain Frequency: Intermittent Pain Onset: On-going Pain Intervention(s): Medication (See eMAR) Multiple Pain Sites: No      Therapy/Group: Individual Therapy  Ladoris Gene 10/01/2019, 9:30 AM

## 2019-10-01 NOTE — Progress Notes (Signed)
[place] PHYSICAL MEDICINE & REHABILITATION PROGRESS NOTE   Subjective/Complaints: Feeling well today. Thinks that left leg pain is better. Wearing limb guard this morning  ROS: Patient denies fever, rash, sore throat, blurred vision, nausea, vomiting, diarrhea, cough, shortness of breath or chest pain,  headache, or mood change.   Objective:  No results found. No results for input(s): WBC, HGB, HCT, PLT in the last 72 hours. No results for input(s): NA, K, CL, CO2, GLUCOSE, BUN, CREATININE, CALCIUM in the last 72 hours.  Intake/Output Summary (Last 24 hours) at 10/01/2019 1020 Last data filed at 10/01/2019 0900 Gross per 24 hour  Intake 455 ml  Output 0 ml  Net 455 ml     Physical Exam: Vital Signs Blood pressure (!) 155/84, pulse 74, temperature 97.9 F (36.6 C), temperature source Axillary, resp. rate 16, height 5\' 2"  (1.575 m), weight 59.9 kg, last menstrual period 02/12/2011, SpO2 96 %. Constitutional: No distress . Vital signs reviewed. HEENT: EOMI, oral membranes moist Neck: supple Cardiovascular: RRR without murmur. No JVD    Respiratory: CTA Bilaterally without wheezes or rales. Normal effort    GI: BS +, non-tender, non-distended  Abdomen: Soft, non-tender, non-distended, bowel sounds positive. Extremities: No clubbing, cyanosis, or edema. Pulses are 2+ Neurological: Patient is alert no acute distress.  Oriented x3 and follows commands.  Skin: Left BKA with limb guard in place.  Right BKA is well-healed, moving well.  Psych: pleasant  Assessment/Plan: 1. Functional deficits secondary to bilateral BKA which require 3+ hours per day of interdisciplinary therapy in a comprehensive inpatient rehab setting.  Physiatrist is providing close team supervision and 24 hour management of active medical problems listed below.  Physiatrist and rehab team continue to assess barriers to discharge/monitor patient progress toward functional and medical goals  Care  Tool:  Bathing    Body parts bathed by patient: Right arm, Left arm, Chest, Abdomen, Front perineal area, Buttocks, Right upper leg, Left upper leg, Face   Body parts bathed by helper: Buttocks, Right upper leg, Left upper leg Body parts n/a: Left lower leg, Right lower leg   Bathing assist Assist Level: Minimal Assistance - Patient > 75%     Upper Body Dressing/Undressing Upper body dressing   What is the patient wearing?: Pull over shirt    Upper body assist Assist Level: Set up assist    Lower Body Dressing/Undressing Lower body dressing    Lower body dressing activity did not occur: Safety/medical concerns What is the patient wearing?: Pants     Lower body assist Assist for lower body dressing: Moderate Assistance - Patient 50 - 74%     Toileting Toileting Toileting Activity did not occur Landscape architect and hygiene only): N/A (no void or bm)  Toileting assist Assist for toileting: Minimal Assistance - Patient > 75%     Transfers Chair/bed transfer  Transfers assist     Chair/bed transfer assist level: Minimal Assistance - Patient > 75%     Locomotion Ambulation   Ambulation assist   Ambulation activity did not occur: Safety/medical concerns(new L BKA and new R prosthesis, unable to come to a full stance due to decreased strength/balance)          Walk 10 feet activity   Assist  Walk 10 feet activity did not occur: Safety/medical concerns(new L BKA and new R prosthesis, unable to come to a full stance due to decreased strength/balance)        Walk 50 feet activity   Assist  Walk 50 feet with 2 turns activity did not occur: Safety/medical concerns(new L BKA and new R prosthesis, unable to come to a full stance due to decreased strength/balance)         Walk 150 feet activity   Assist Walk 150 feet activity did not occur: Safety/medical concerns(new L BKA and new R prosthesis, unable to come to a full stand due to decreased  strength/balance)         Walk 10 feet on uneven surface  activity   Assist Walk 10 feet on uneven surfaces activity did not occur: Safety/medical concerns(new L BKA and new R prosthesis, unable to come to a full stand due to decreased strength/balance)         Wheelchair     Assist Will patient use wheelchair at discharge?: Yes Type of Wheelchair: Manual    Wheelchair assist level: Supervision/Verbal cueing Max wheelchair distance: 150'    Wheelchair 50 feet with 2 turns activity    Assist        Assist Level: Supervision/Verbal cueing   Wheelchair 150 feet activity     Assist      Assist Level: Supervision/Verbal cueing   Blood pressure (!) 155/84, pulse 74, temperature 97.9 F (36.6 C), temperature source Axillary, resp. rate 16, height 5\' 2"  (1.575 m), weight 59.9 kg, last menstrual period 02/12/2011, SpO2 96 %.  Medical Problem List and Plan: 1.  Decreased functional mobility secondary to left transtibial amputation 09/21/2019 with wound VAC as directed as well as history of right BKA 12/31/2018.  Patient does have a right prosthesis.             -patient may shower but leg must be covered.              -ELOS/Goals: 10-14 days, modI in PT, OT, and I in SLP 2.  Antithrombotics: -DVT/anticoagulation: Patient has bilateral BKA             -antiplatelet therapy: Aspirin 81 mg daily 3. Pain Management: Oxycodone and Robaxin as needed  1/23 fair control. Discussed massage and sensory feedback today 4. Mood: Provide emotional support             -antipsychotic agents: N/A 5. Neuropsych: This patient is capable of making decisions on her own behalf. 6. Skin/Wound Care: Routine skin checks 7. Fluids/Electrolytes/Nutrition: Routine in and outs with follow-up chemistries 8.  CKD stage III.  Baseline creatinine 1.21-2.13.  Follow-up Cr 0.94 on 1/20 9.  CAD with CABG October 2019.  Continue aspirin therapy 10.  Acute on chronic diastolic congestive heart  failure.  Monitor for any signs of fluid overload 11.  History of intracerebral hemorrhage February 2017.  Follow-up outpatient 12.  Diabetes mellitus with peripheral neuropathy.  Hemoglobin A1c 14.1.  SSI.  Patient on Lantus insulin 50 units twice daily prior to admission.    1/23 resume lantus at 5u bid 13.  Hypertension.  Norvasc 5 mg daily. Systolics in last 2 days ranging from 141 to 171. Consider increasing Norvasc to 10mg  daily.   1/20: Will increase Norvasc to 10mg  for uncontrolled BP.  1/21: Continues to be elevated. Will add Hydralazine 10mg  daily.   1/22: BP and HR still slightly elevated. Will increase Hydralazine to 10mg  BID.   1/23 better control. Observe today 14.  Hyperlipidemia.  Lipitor 15.  Constipation: Docusate 100mg  BID. Has not yet had BM. Consider changing to Senna-docusate BID.   1/20: Still constipated, will add daily Miralax  1/23: had a bm  on 1/20, small   -still refusing meds   -continued education by staff, encourage use of softeners/laxative 16. Itching:  hydroxyzine prn.    LOS: 4 days A FACE TO FACE EVALUATION WAS PERFORMED  Meredith Staggers 10/01/2019, 10:20 AM

## 2019-10-02 ENCOUNTER — Inpatient Hospital Stay (HOSPITAL_COMMUNITY): Payer: BC Managed Care – PPO | Admitting: Physical Therapy

## 2019-10-02 LAB — GLUCOSE, CAPILLARY
Glucose-Capillary: 140 mg/dL — ABNORMAL HIGH (ref 70–99)
Glucose-Capillary: 155 mg/dL — ABNORMAL HIGH (ref 70–99)
Glucose-Capillary: 147 mg/dL — ABNORMAL HIGH (ref 70–99)
Glucose-Capillary: 255 mg/dL — ABNORMAL HIGH (ref 70–99)

## 2019-10-02 MED ORDER — SORBITOL 70 % SOLN: 60.0000 mL | Status: AC

## 2019-10-02 NOTE — Progress Notes (Signed)
[place] PHYSICAL MEDICINE & REHABILITATION PROGRESS NOTE   Subjective/Complaints: Woke up nauseas. Really not moving bowels well but eating consistently either. Had small bm 1/20  ROS: Patient denies fever, rash, sore throat, blurred vision,  diarrhea, cough, shortness of breath or chest pain,  back pain, headache, or mood change.   Objective:  No results found. No results for input(s): WBC, HGB, HCT, PLT in the last 72 hours. No results for input(s): NA, K, CL, CO2, GLUCOSE, BUN, CREATININE, CALCIUM in the last 72 hours.  Intake/Output Summary (Last 24 hours) at 10/02/2019 0959 Last data filed at 10/02/2019 0800 Gross per 24 hour  Intake 480 ml  Output --  Net 480 ml     Physical Exam: Vital Signs Blood pressure (!) 162/82, pulse 75, temperature 97.8 F (36.6 C), resp. rate 16, height 5\' 2"  (1.575 m), weight 59.9 kg, last menstrual period 02/12/2011, SpO2 99 %. Constitutional: looks a little uncomfortable. Vital signs reviewed. HEENT: EOMI, oral membranes moist Neck: supple Cardiovascular: RRR without murmur. No JVD    Respiratory: CTA Bilaterally without wheezes or rales. Normal effort    GI: BS +, non-tender, non-distended  Abdomen: Soft, non-tender, non-distended, bowel sounds positive. Extremities: No clubbing, cyanosis, or edema. Pulses are 2+ Neurological: Patient is alert no acute distress.  Oriented x3 and follows commands.  Skin: Left BKA limb guard remains in place.  Right BKA is well-healed, moving well.  Psych: pleasant  Assessment/Plan: 1. Functional deficits secondary to bilateral BKA which require 3+ hours per day of interdisciplinary therapy in a comprehensive inpatient rehab setting.  Physiatrist is providing close team supervision and 24 hour management of active medical problems listed below.  Physiatrist and rehab team continue to assess barriers to discharge/monitor patient progress toward functional and medical goals  Care Tool:  Bathing     Body parts bathed by patient: Right arm, Left arm, Chest, Abdomen, Front perineal area, Buttocks, Right upper leg, Left upper leg, Face   Body parts bathed by helper: Buttocks, Right upper leg, Left upper leg Body parts n/a: Left lower leg, Right lower leg   Bathing assist Assist Level: Minimal Assistance - Patient > 75%     Upper Body Dressing/Undressing Upper body dressing   What is the patient wearing?: Pull over shirt    Upper body assist Assist Level: Set up assist    Lower Body Dressing/Undressing Lower body dressing    Lower body dressing activity did not occur: Safety/medical concerns What is the patient wearing?: Pants     Lower body assist Assist for lower body dressing: Moderate Assistance - Patient 50 - 74%     Toileting Toileting Toileting Activity did not occur Landscape architect and hygiene only): N/A (no void or bm)  Toileting assist Assist for toileting: Minimal Assistance - Patient > 75%     Transfers Chair/bed transfer  Transfers assist     Chair/bed transfer assist level: Minimal Assistance - Patient > 75%     Locomotion Ambulation   Ambulation assist   Ambulation activity did not occur: Safety/medical concerns(new L BKA and new R prosthesis, unable to come to a full stance due to decreased strength/balance)          Walk 10 feet activity   Assist  Walk 10 feet activity did not occur: Safety/medical concerns(new L BKA and new R prosthesis, unable to come to a full stance due to decreased strength/balance)        Walk 50 feet activity   Assist Walk  50 feet with 2 turns activity did not occur: Safety/medical concerns(new L BKA and new R prosthesis, unable to come to a full stance due to decreased strength/balance)         Walk 150 feet activity   Assist Walk 150 feet activity did not occur: Safety/medical concerns(new L BKA and new R prosthesis, unable to come to a full stand due to decreased strength/balance)          Walk 10 feet on uneven surface  activity   Assist Walk 10 feet on uneven surfaces activity did not occur: Safety/medical concerns(new L BKA and new R prosthesis, unable to come to a full stand due to decreased strength/balance)         Wheelchair     Assist Will patient use wheelchair at discharge?: Yes Type of Wheelchair: Manual    Wheelchair assist level: Supervision/Verbal cueing Max wheelchair distance: 150'    Wheelchair 50 feet with 2 turns activity    Assist        Assist Level: Supervision/Verbal cueing   Wheelchair 150 feet activity     Assist      Assist Level: Supervision/Verbal cueing   Blood pressure (!) 162/82, pulse 75, temperature 97.8 F (36.6 C), resp. rate 16, height 5\' 2"  (1.575 m), weight 59.9 kg, last menstrual period 02/12/2011, SpO2 99 %.  Medical Problem List and Plan: 1.  Decreased functional mobility secondary to left transtibial amputation 09/21/2019 with wound VAC as directed as well as history of right BKA 12/31/2018.  Patient does have a right prosthesis.             -patient may shower but leg must be covered.              -ELOS/Goals: 10-14 days, modI in PT, OT, and I in SLP 2.  Antithrombotics: -DVT/anticoagulation: Patient has bilateral BKA             -antiplatelet therapy: Aspirin 81 mg daily 3. Pain Management: Oxycodone and Robaxin as needed  1/23 fair control. Discussed massage and sensory feedback today 4. Mood: Provide emotional support             -antipsychotic agents: N/A 5. Neuropsych: This patient is capable of making decisions on her own behalf. 6. Skin/Wound Care: Routine skin checks 7. Fluids/Electrolytes/Nutrition: Routine in and outs with follow-up chemistries 8.  CKD stage III.  Baseline creatinine 1.21-2.13.  Follow-up Cr 0.94 on 1/20 9.  CAD with CABG October 2019.  Continue aspirin therapy 10.  Acute on chronic diastolic congestive heart failure.  Monitor for any signs of fluid overload 11.   History of intracerebral hemorrhage February 2017.  Follow-up outpatient 12.  Diabetes mellitus with peripheral neuropathy.  Hemoglobin A1c 14.1.  SSI.  Patient on Lantus insulin 50 units twice daily prior to admission.    1/23 resume lantus at 5u bid  1/24 some improvement--monitor for pattern 13.  Hypertension.  Norvasc 5 mg daily. Systolics in last 2 days ranging from 141 to 171. Consider increasing Norvasc to 10mg  daily.   1/20: Will increase Norvasc to 10mg  for uncontrolled BP.  1/21: Continues to be elevated. Will add Hydralazine 10mg  daily.   1/22: BP and HR still slightly elevated. Will increase Hydralazine to 10mg  BID.   1/24 fair control, nausea may be increasing SBP sl 14.  Hyperlipidemia.  Lipitor 15.  Constipation: Docusate 100mg  BID. Has not yet had BM. Consider changing to Senna-docusate BID.   1/20: Still constipated, will add daily  Miralax  1/23: had a bm on 1/20, small   -still refusing meds   -continued education by staff, encourage use of softeners/laxative  1/24: discussed with pt today. She realizes she needs to get bowels going   -sorbitol today, SSE if needed 16. Itching:  hydroxyzine prn.    LOS: 5 days A FACE TO FACE EVALUATION WAS PERFORMED  Meredith Staggers 10/02/2019, 9:59 AM

## 2019-10-02 NOTE — Progress Notes (Signed)
Physical Therapy Session Note  Patient Details  Name: [name] MRN: [id number] Date of Birth: 1965/10/19  Today's Date: 10/02/2019 PT Individual Time: 0950-1036 PT Individual Time Calculation (min): 46 min   Short Term Goals: Week 2:     Skilled Therapeutic Interventions/Progress Updates: Pt presents supine in bed asleep, but easily arousable.  Pt c/o nausea and fatigue but agreeable to therapy.  Pt donned right gel sleeve supine w/ after handing to her.  Pt performed sup to sit at EOB w/ supervision and then donned right BKA prosthesis.  Pt assisted w/ pants while sitting but states need to use BR.  Pt performed modified SPT bed. w/c w/ min A and then taken to BR and min A for transfer w/c > toilet w/ min A.  Pt successful w/ urine and BM continence.  Nursing aware.  Pt transferred to partial stand using hand rail in BR to pull up pants and transfer into w/c.  Pt required seated rest break d/t fatigue.  Pt states feeling better w/ BM and performed w/c mobility in hallways up to 150', but also shorter distances, approx. 18' before requiring rest break, mostly d/t stomach tightness per pt.  Pt remained in w/c at conclusion w/ all needs within reach and alarm on.     Therapy Documentation Precautions:  Precautions Precautions: Fall Precaution Comments: wound vac Required Braces or Orthoses: (residual limb protector for LLE) Restrictions Weight Bearing Restrictions: Yes LLE Weight Bearing: Non weight bearing Other Position/Activity Restrictions: limb guard LLE General:   Vital Signs:  Pain: 0/10 pain w/ activity.   :      Therapy/Group: Individual Therapy  Ladoris Gene 10/02/2019, 10:49 AM

## 2019-10-03 ENCOUNTER — Encounter (HOSPITAL_COMMUNITY): Payer: BC Managed Care – PPO | Admitting: Psychology

## 2019-10-03 ENCOUNTER — Inpatient Hospital Stay (HOSPITAL_COMMUNITY): Payer: BC Managed Care – PPO | Admitting: Occupational Therapy

## 2019-10-03 ENCOUNTER — Inpatient Hospital Stay (HOSPITAL_COMMUNITY): Payer: BC Managed Care – PPO

## 2019-10-03 DIAGNOSIS — F4323 Adjustment disorder with mixed anxiety and depressed mood: Secondary | ICD-10-CM

## 2019-10-03 LAB — GLUCOSE, CAPILLARY
Glucose-Capillary: 185 mg/dL — ABNORMAL HIGH (ref 70–99)
Glucose-Capillary: 201 mg/dL — ABNORMAL HIGH (ref 70–99)
Glucose-Capillary: 133 mg/dL — ABNORMAL HIGH (ref 70–99)
Glucose-Capillary: 355 mg/dL — ABNORMAL HIGH (ref 70–99)

## 2019-10-03 MED ORDER — INSULIN ASPART 100 UNIT/ML IV SOLN
2.0000 [IU] | Freq: Once | INTRAVENOUS | Status: AC
  Administered 2019-10-03: 2 [IU] via SUBCUTANEOUS

## 2019-10-03 NOTE — Progress Notes (Signed)
Occupational Therapy Session Note  Patient Details  Name: [name] MRN: [id number] Date of Birth: [birth year]  Today's Date: 10/03/2019 OT Individual Time: 1345-1500 OT Individual Time Calculation (min): 75 min    Short Term Goals: Week 1:  OT Short Term Goal 1 (Week 1): na STG = LTG  Skilled Therapeutic Interventions/Progress Updates:    Patient seated in w/c, states that she feels pretty good today and she wants to work on strengthening.  Reviewed goals for therapy and encouraged activities out of w/c, transfer training with and without right prosthesis, adl/toileting training, basic HM w/c level but she refuses to complete these tasks at this time.  She is able to propel w/c to/from therapy treatment gym spaces.  She did participate in UB ergometer 2x 91minutes, 3# dowel for 30 reps of horiz abd, flex, presses, w/c push ups 4 x 5 reps,  lateral lean and core exercises at w/c level.  She remained in w/c at close of session, seat belt alarm set and call bell in reach.    Therapy Documentation Precautions:  Precautions Precautions: Fall Precaution Comments: wound vac Required Braces or Orthoses: (residual limb protector for LLE) Restrictions Weight Bearing Restrictions: No LLE Weight Bearing: Non weight bearing Other Position/Activity Restrictions: limb guard LLE General:   Vital Signs: Therapy Vitals Temp: 98.6 F (37 C) Temp Source: Oral Pulse Rate: 81 Resp: 18 BP: 124/71 Patient Position (if appropriate): Sitting Oxygen Therapy SpO2: 98 % O2 Device: Room Air Pain: Pain Assessment Pain Scale: 0-10 Pain Score: 1    Other Treatments:     Therapy/Group: Individual Therapy  Carlos Levering 10/03/2019, 4:25 PM

## 2019-10-03 NOTE — Progress Notes (Signed)
[place] PHYSICAL MEDICINE & REHABILITATION PROGRESS NOTE   Subjective/Complaints:   Reports she had multiple BMs. And doesn't feel constipated anymore.  Does feel nauseated every AM- no matter what she does- it's "normal" for her.  Describes it as "sick on her stomach".  Asking when will be d/c'd.  Don't know currently, but explained has team conference tomorrow.   ROS: Patient denies fever, rash, sore throat, blurred vision,  diarrhea, cough, shortness of breath or chest pain,  back pain, headache, or mood change.   Objective:  No results found. No results for input(s): WBC, HGB, HCT, PLT in the last 72 hours. No results for input(s): NA, K, CL, CO2, GLUCOSE, BUN, CREATININE, CALCIUM in the last 72 hours.  Intake/Output Summary (Last 24 hours) at 10/03/2019 0925 Last data filed at 10/03/2019 0819 Gross per 24 hour  Intake 540 ml  Output --  Net 540 ml     Physical Exam: Vital Signs Blood pressure (!) 150/81, pulse 87, temperature 98.7 F (37.1 C), temperature source Oral, resp. rate 20, height 5\' 2"  (1.575 m), weight 59.9 kg, last menstrual period 02/12/2011, SpO2 99 %. Constitutional: Vital signs reviewed.Sitting up in manual w/c, burping occ, appropriate, social niceties observed;  NAD HEENT: EOMI, oral membranes moist Neck: supple Cardiovascular: RRR without murmur. No JVD    Respiratory: CTA Bilaterally without wheezes or rales. Normal effort    GI: BS +, non-tender, non-distended, soft  Abdomen: Soft, non-tender, non-distended, bowel sounds positive. Extremities: No clubbing, cyanosis, or edema. Pulses are 2+ Neurological: Patient is alert no acute distress.  Oriented x3 and follows commands.  Skin: Left BKA limb guard remains in place.  Right BKA is well-healed, moving well.  Psych: pleasant  Assessment/Plan: 1. Functional deficits secondary to bilateral BKA which require 3+ hours per day of interdisciplinary therapy in a comprehensive inpatient rehab  setting.  Physiatrist is providing close team supervision and 24 hour management of active medical problems listed below.  Physiatrist and rehab team continue to assess barriers to discharge/monitor patient progress toward functional and medical goals  Care Tool:  Bathing    Body parts bathed by patient: Right arm, Left arm, Chest, Abdomen, Front perineal area, Buttocks, Right upper leg, Left upper leg, Face   Body parts bathed by helper: Buttocks, Right upper leg, Left upper leg Body parts n/a: Left lower leg, Right lower leg   Bathing assist Assist Level: Minimal Assistance - Patient > 75%     Upper Body Dressing/Undressing Upper body dressing   What is the patient wearing?: Pull over shirt    Upper body assist Assist Level: Set up assist    Lower Body Dressing/Undressing Lower body dressing    Lower body dressing activity did not occur: Safety/medical concerns What is the patient wearing?: Pants     Lower body assist Assist for lower body dressing: Moderate Assistance - Patient 50 - 74%     Toileting Toileting Toileting Activity did not occur Landscape architect and hygiene only): N/A (no void or bm)  Toileting assist Assist for toileting: Minimal Assistance - Patient > 75%     Transfers Chair/bed transfer  Transfers assist     Chair/bed transfer assist level: Minimal Assistance - Patient > 75%     Locomotion Ambulation   Ambulation assist   Ambulation activity did not occur: Safety/medical concerns(new L BKA and new R prosthesis, unable to come to a full stance due to decreased strength/balance)          Walk  10 feet activity   Assist  Walk 10 feet activity did not occur: Safety/medical concerns(new L BKA and new R prosthesis, unable to come to a full stance due to decreased strength/balance)        Walk 50 feet activity   Assist Walk 50 feet with 2 turns activity did not occur: Safety/medical concerns(new L BKA and new R prosthesis,  unable to come to a full stance due to decreased strength/balance)         Walk 150 feet activity   Assist Walk 150 feet activity did not occur: Safety/medical concerns(new L BKA and new R prosthesis, unable to come to a full stand due to decreased strength/balance)         Walk 10 feet on uneven surface  activity   Assist Walk 10 feet on uneven surfaces activity did not occur: Safety/medical concerns(new L BKA and new R prosthesis, unable to come to a full stand due to decreased strength/balance)         Wheelchair     Assist Will patient use wheelchair at discharge?: Yes Type of Wheelchair: Manual    Wheelchair assist level: Supervision/Verbal cueing Max wheelchair distance: 150'    Wheelchair 50 feet with 2 turns activity    Assist        Assist Level: Supervision/Verbal cueing   Wheelchair 150 feet activity     Assist      Assist Level: Supervision/Verbal cueing   Blood pressure (!) 150/81, pulse 87, temperature 98.7 F (37.1 C), temperature source Oral, resp. rate 20, height 5\' 2"  (1.575 m), weight 59.9 kg, last menstrual period 02/12/2011, SpO2 99 %.  Medical Problem List and Plan: 1.  Decreased functional mobility secondary to left transtibial amputation 09/21/2019 with wound VAC as directed as well as history of right BKA 12/31/2018.  Patient does have a right prosthesis.             -patient may shower but leg must be covered.              -ELOS/Goals: 10-14 days, modI in PT, OT, and I in SLP 2.  Antithrombotics: -DVT/anticoagulation: Patient has bilateral BKA             -antiplatelet therapy: Aspirin 81 mg daily 3. Pain Management: Oxycodone and Robaxin as needed  1/23 fair control. Discussed massage and sensory feedback today 4. Mood: Provide emotional support             -antipsychotic agents: N/A 5. Neuropsych: This patient is capable of making decisions on her own behalf. 6. Skin/Wound Care: Routine skin checks 7.  Fluids/Electrolytes/Nutrition: Routine in and outs with follow-up chemistries 8.  CKD stage III.  Baseline creatinine 1.21-2.13.  Follow-up Cr 0.94 on 1/20 9.  CAD with CABG October 2019.  Continue aspirin therapy 10.  Acute on chronic diastolic congestive heart failure.  Monitor for any signs of fluid overload 11.  History of intracerebral hemorrhage February 2017.  Follow-up outpatient 12.  Diabetes mellitus with peripheral neuropathy.  Hemoglobin A1c 14.1.  SSI.  Patient on Lantus insulin 50 units twice daily prior to admission.    1/23 resume lantus at 5u bid  1/24 some improvement--monitor for pattern 13.  Hypertension.  Norvasc 5 mg daily. Systolics in last 2 days ranging from 141 to 171. Consider increasing Norvasc to 10mg  daily.   1/20: Will increase Norvasc to 10mg  for uncontrolled BP.  1/21: Continues to be elevated. Will add Hydralazine 10mg  daily.   1/22: BP  and HR still slightly elevated. Will increase Hydralazine to 10mg  BID.   1/24 fair control, nausea may be increasing SBP sl  1/25- SBP 150s this AM- better overall- will monitor 14.  Hyperlipidemia.  Lipitor 15.  Constipation: Docusate 100mg  BID. Has not yet had BM. Consider changing to Senna-docusate BID.   1/20: Still constipated, will add daily Miralax  1/23: had a bm on 1/20, small   -still refusing meds   -continued education by staff, encourage use of softeners/laxative  1/24: discussed with pt today. She realizes she needs to get bowels going   -sorbitol today, SSE if needed  1/25- 3 BMs documented in last 24 hours- hasn't changed nausea in AM 16. Itching:  hydroxyzine prn.    LOS: 6 days A FACE TO FACE EVALUATION WAS PERFORMED  Mehr Depaoli 10/03/2019, 9:25 AM

## 2019-10-03 NOTE — Progress Notes (Signed)
Physical Therapy Session Note  Patient Details  Name: [name] MRN: [id number] Date of Birth: November 20, 1965  Today's Date: 10/03/2019 PT Individual Time: 1015-1100 PT Individual Time Calculation (min): 45 min   Short Term Goals: Week 1:  PT Short Term Goal 1 (Week 1): STG=LTG due to short ELOS  Skilled Therapeutic Interventions/Progress Updates:     Patient in w/c in room upon PT arrival. Patient alert and agreeable to PT session. Patient reported 4/10 L residual limb pain during session, patient was premedicated prior to session. PT provided repositioning, rest breaks, and distraction as pain interventions throughout session. Patient had her R prosthesis and L limb guard with a shrinker donned prior to and throughout session, except where noted below.   Therapeutic Activity: Transfers: Patient performed squat pivot transfers to/from [facility] over the toilet from the w/c using the grab bar with min A-CGA. Provided verbal cues for hand placement and shifting her weight over her R prosthesis. Patient declined standing in the // bars due to fear of falling then declined transferring to the mat table due to fear of falling. PT provided education on technique for transfers and encouraged her to attempt either a stand or a squat pivot transfer to the mat table. The patient agreed to try a squat pivot transfers. She performed squat pivot transfers w/c>mat table to the R with min A-CGA and mat table>w/c to the L with mod A. Provided cues for hand placement, board placement, and head-hips relationship for proper technique and decreased assist with transfers. Patient stated that she felt safe during the transfers and agreed to try standing with this therapist tomorrow after.   Wheelchair Mobility:  Patient propelled wheelchair 135 feet x2 with supervision. Provided verbal cues for management of leg rest and using drop down leg rest on R with wearing prosthesis versus amputee pad on R when not wearing  prosthesis. Provided patient with w/c gloved for safety during session.   Therapeutic Exercise: Patient performed the following exercises with verbal and tactile cues for proper technique. Patient doffed her L limb guard for exercises. She doffed/donned the limb guard seated on EOM with min-mod A for placement and management of straps. -B LAQ 2x10 (with R prosthesis donned) -alternating seated marching 2x10 -seated isometric hip adduction with green ball 2x10 with 5 sec holds  Patient in w/c in room at end of session with breaks locked, seat belt alarm set, and all needs within reach.    Therapy Documentation Precautions:  Precautions Precautions: Fall Precaution Comments: wound vac Required Braces or Orthoses: (residual limb protector for LLE) Restrictions Weight Bearing Restrictions: No LLE Weight Bearing: Non weight bearing Other Position/Activity Restrictions: limb guard LLE    Therapy/Group: Individual Therapy  Sonya Pucci L Trayton Szabo PT, DPT  10/03/2019, 12:58 PM

## 2019-10-03 NOTE — Consult Note (Signed)
Neuropsychological Consultation   Patient:   [name]   DOB:   1966-08-23  MR Number:  [id number]  Location:  [facility] [address] C[facility] [address] [place] [id number] [place] [address] Dept: [place]: [phone]           Date of Service:   10/03/19  Start Time:   9 AM End Time:   10 AM  Provider/Observer:  Ilean Skill, Psy.D.       Clinical Neuropsychologist       Billing Code/Service: [id number]  Chief Complaint:    Konni Lipschutz. Leist is a 54 year old female with a history of anxiety, chronic kidney disease stage III, acute on chronic diastolic congestive heart failure, CAD with CABG October 2019, remote tobacco abuse, obesity, hypertension, intracerebral hemorrhage 2017 secondary to hypertensive crisis, hyperlipidemia, diabetes and peripheral vascular disease.  The patient was previously seen in the inpatient rehab unit after a right BKA on 12/31/2018.  The patient's husband passed away recently and her children and grandchildren have moved into her home with her.  The patient does have a history of anxiety and coping with extensive coping issues with the death of her husband and past amputation/medical issues.  The patient reports that she has been using her prosthetic on a limited basis but mostly using a wheelchair prior to this.  The patient presented on 09/20/2018 with gangrenous changes and left heel with significant odor and drainage.  Patient underwent left transtibial amputation on 09/21/2019.  Reason for Service:  The patient was referred for neuropsychological consultation due to coping and adjustment issues.  Below is the HPI for the current admission.  HPI: Anel Davidson. Stordahl is a 54 year old right-handed female with history of anxiety, chronic kidney disease stage III, acute on chronic diastolic congestive heart failure, CAD with CABG October 2019, remote tobacco abuse 14 years ago, obesity with BMI  29.26 hypertension, intracerebral hemorrhage February 2017 secondary to hypertensive crisis, hyperlipidemia, diabetes mellitus and peripheral vascular disease.  Patient well-known to rehab services after right BKA 12/31/2018 and received inpatient rehab services 01/05/2019 to 01/15/2019 and discharged to home.  Per chart review patient lives with her children.  1 level home ramped entrance.  She does have a prosthesis for right lower extremity that she has used on a limited basis primarily using wheelchair.  Presented 09/21/2019 with gangrenous changes to left heel with significant odor and drainage.  She had been on doxycycline for 2 weeks followed by Dr. [name] Given.  No change with conservative care with increased ischemic changes and underwent left transtibial amputation 09/21/2019 per Dr. [name] Given.  Hospital course pain management with wound VAC as directed.  Therapy evaluations completed and patient was admitted for a comprehensive rehab program.  Current Status:  The patient reports that she is coping fairly well with the second amputation.  She reports that everything seems familiar to her.  The patient reports that she had been dealing with recent death of her husband due to heart attack and her family moving into live with her.  She reports that she had not been familiar with having young kids around for quite some time.  The patient reports that her mood is fairly stable and denies any significant anxiety or depressive symptoms but does acknowledge this is been a challenge to cope with everything.  The patient was oriented with good cognition.  Behavioral Observation: MICHAE PLYMIRE  presents as a 54 y.o.-year-old Right Caucasian Female who appeared  her stated age. her dress was Appropriate and she was Well Groomed and her manners were Appropriate to the situation.  her participation was indicative of Appropriate and Redirectable behaviors.  There were any physical disabilities noted.  she displayed an appropriate  level of cooperation and motivation.     Interactions:    Active Appropriate and Redirectable  Attention:   abnormal and attention span appeared shorter than expected for age  Memory:   within normal limits; recent and remote memory intact  Visuo-spatial:  not examined  Speech (Volume):  low  Speech:   normal; normal  Thought Process:  Coherent and Relevant  Though Content:  WNL; not suicidal and not homicidal  Orientation:   person, place, time/date and situation  Judgment:   Fair  Planning:   Fair  Affect:    Appropriate  Mood:    Dysphoric  Insight:   Good  Intelligence:   normal  Medical History:   Past Medical History:  Diagnosis Date  . Abscess of great toe, right   . Acute on chronic diastolic CHF (congestive heart failure) ([place]) 06/24/2018  . Acute osteomyelitis of toe, left ([place]) 09/05/2016  . Amputation of right great toe ([place]) 12/22/2018  . Anxiety   . Cataract    left - surgery to remove  . Cellulitis of foot, right 12/11/2018  . COMMON MIGRAINE 10/07/2010  . Decreased visual acuity 11/10/2016  . Depression 12/18/2012  . Diabetes mellitus type II, uncontrolled ([place]) 10/07/2010   Qualifier: Diagnosis of  By: Charlett Blake MD, [name]    . Diabetic foot infection ([place]) 08/26/2016  . Diabetic infection of right foot ([place])   . Disturbance of skin sensation 10/07/2010  . Gangrene of right foot ([place])   . GERD (gastroesophageal reflux disease)   . Heart murmur   . History of kidney stones    "years ago" - passed stones  . Hyperlipidemia 12/06/2010  . Hypertension   . Hypothyroidism   . Lipoma of abdominal wall 10/05/2016  . Overweight(278.02) 12/06/2010  . PERIPHERAL NEUROPATHY, FEET 10/07/2010  . PVD (peripheral vascular disease) ([place]) 01/21/2012  . RESTLESS LEG SYNDROME 10/25/2010  . Stroke [facility]) 2014, 2017   most recently in 2/17 - intracerebral hemorrhage    Psychiatric History:  Past history of anxiety and depression  Family Med/Psych History:  Family History   Problem Relation Age of Onset  . Arthritis Mother   . Stroke Brother        previous smoker  . Alcohol abuse Brother        in remission  . Leukemia Brother   . Diabetes Paternal Grandmother   . Healthy Son     Impression/DX:  Maribi Malley. Rogan is a 54 year old female with a history of anxiety, chronic kidney disease stage III, acute on chronic diastolic congestive heart failure, CAD with CABG October 2019, remote tobacco abuse, obesity, hypertension, intracerebral hemorrhage 2017 secondary to hypertensive crisis, hyperlipidemia, diabetes and peripheral vascular disease.  The patient was previously seen in the inpatient rehab unit after a right BKA on 12/31/2018.  The patient's husband passed away recently and her children and grandchildren have moved into her home with her.  The patient does have a history of anxiety and coping with extensive coping issues with the death of her husband and past amputation/medical issues.  The patient reports that she has been using her prosthetic on a limited basis but mostly using a wheelchair prior to this.  The patient presented on 09/20/2018 with gangrenous  changes and left heel with significant odor and drainage.  Patient underwent left transtibial amputation on 09/21/2019.  The patient reports that she is coping fairly well with the second amputation.  She reports that everything seems familiar to her.  The patient reports that she had been dealing with recent death of her husband due to heart attack and her family moving into live with her.  She reports that she had not been familiar with having young kids around for quite some time.  The patient reports that her mood is fairly stable and denies any significant anxiety or depressive symptoms but does acknowledge this is been a challenge to cope with everything.  The patient was oriented with good cognition.  Disposition/Plan:  Will follow-up with patient on Wed, if possible.         Electronically  Signed   _______________________ Ilean Skill, Psy.D.

## 2019-10-03 NOTE — Progress Notes (Signed)
Occupational Therapy Session Note  Patient Details  Name: [name] MRN: [id number] Date of Birth: 1965/11/14  Today's Date: 10/03/2019 OT Individual Time: [id number] OT Individual Time Calculation (min): 9 min    Short Term Goals: Week 1:  OT Short Term Goal 1 (Week 1): na STG = LTG  Skilled Therapeutic Interventions/Progress Updates: Patient c/o of nausea, fatigue and "I do not feel good.   Please just go."   She concurred to demonstrate that she was wipe/wash bottom with lateral leans in her chair before she started to cry and put her head in her hands.   Patient was offered a drink or assist back to bed but she said, 'No thank you.   I just cannot do these morning therapies.   I amtoo sick in the mornings.    Do not send anybody in here before 10 am , or just let me work in the afternoons."   A note with this info was posted on therapy board/computer.     Therapy Documentation Precautions:  Precautions Precautions: Fall Precaution Comments: wound vac Required Braces or Orthoses: (residual limb protector for LLE) Restrictions Weight Bearing Restrictions: No LLE Weight Bearing: Non weight bearing Other Position/Activity Restrictions: limb guard LLE General: General OT Amount of Missed Time: 90 Minutes   Pain:rn gave meds prior Pain Assessment Pain Scale: 0-10 Pain Score: 5  Pain Type: Acute pain;Surgical pain Pain Location: Leg Pain Orientation: Left Pain Descriptors / Indicators: Sharp Pain Frequency: Intermittent Pain Onset: On-going Pain Intervention(s): Medication (See eMAR)   Therapy/Group: Individual Therapy  Alfredia Ferguson [facility] 10/03/2019, 10:58 AM

## 2019-10-04 ENCOUNTER — Inpatient Hospital Stay (HOSPITAL_COMMUNITY): Payer: BC Managed Care – PPO

## 2019-10-04 ENCOUNTER — Inpatient Hospital Stay (HOSPITAL_COMMUNITY): Payer: BC Managed Care – PPO | Admitting: Occupational Therapy

## 2019-10-04 LAB — GLUCOSE, CAPILLARY
Glucose-Capillary: 164 mg/dL — ABNORMAL HIGH (ref 70–99)
Glucose-Capillary: 215 mg/dL — ABNORMAL HIGH (ref 70–99)
Glucose-Capillary: 111 mg/dL — ABNORMAL HIGH (ref 70–99)
Glucose-Capillary: 171 mg/dL — ABNORMAL HIGH (ref 70–99)

## 2019-10-04 MED ORDER — INSULIN GLARGINE 100 UNIT/ML ~~LOC~~ SOLN
5.0000 [IU] | Freq: Every day | SUBCUTANEOUS | Status: DC
  Administered 2019-10-04 – 2019-10-08 (×5): 5 [IU] via SUBCUTANEOUS
  Filled 2019-10-04 (×6): qty 0.05

## 2019-10-04 NOTE — Patient Care Conference (Signed)
Inpatient RehabilitationTeam Conference and Plan of Care Update Date: 10/04/2019   Time: 11:45 AM   Patient Name: [name]      Medical Record Number: [id number]  Date of Birth: Jan 28, 1966 Sex: Female         Room/Bed: 4M13C/4M13C-01 Payor Info: Payor: [place] / Plan: [employer]BS COMM PPO / Product Type: *No Product type* /    Admit Date/Time:  09/27/2019  5:12 PM  Primary Diagnosis:  S/P bilateral BKA (below knee amputation) [facility])  Patient Active Problem List   Diagnosis Date Noted  . Adjustment disorder with mixed anxiety and depressed mood   . S/P bilateral BKA (below knee amputation) ([place]) 09/27/2019  . Acute osteomyelitis of left foot ([place]) 09/21/2019  . Subacute osteomyelitis, left ankle and foot ([place])   . Pressure injury of left heel, unstageable ([place]) 02/09/2019  . Hypoglycemia   . Diabetes mellitus type 2 in nonobese (HCC)   . Labile blood pressure   . Labile blood glucose   . Phantom limb pain ([place])   . CKD (chronic kidney disease), stage III   . Hypothyroidism   . Essential hypertension   . Chronic diastolic congestive heart failure ([place])   . Coronary artery disease involving coronary bypass graft of native heart without angina pectoris   . Poorly controlled type 2 diabetes mellitus with peripheral neuropathy ([place])   . Acute on chronic anemia   . History of below knee amputation, right ([place]) 12/31/2018  . S/P CABG x 3 06/28/2018  . Coronary artery disease involving native coronary artery of native heart with angina pectoris ([place]) 06/25/2018  . Type 2 diabetes mellitus with vascular disease ([place]) 06/24/2018  . Acute on chronic diastolic CHF (congestive heart failure) ([place]) 06/24/2018  . Non-ST elevation (NSTEMI) myocardial infarction ([place]) 06/24/2018  . Chest pain 06/23/2018  . Glaucoma 09/17/2017  . Diabetic retinopathy ([place]) 09/17/2017  . Otitis media 11/10/2016  . Decreased visual acuity 11/10/2016  . Spinal stenosis, lumbar region with neurogenic  claudication 10/22/2016  . Other spondylosis with radiculopathy, lumbar region 10/13/2016  . Lipoma of abdominal wall 10/05/2016  . Severe protein-calorie malnutrition ([place]) 09/04/2016  . Anemia of chronic disease 09/04/2016  . Diabetic polyneuropathy associated with type 2 diabetes mellitus ([place]) 05/07/2016  . Orthostatic hypotension 05/07/2016  . Cerebrovascular disease 04/27/2016  . N&V (nausea and vomiting) 04/01/2016  . ICH (intracerebral hemorrhage) ([place]) 03/30/2016  . Visit for preventive health examination 05/21/2015  . Breast cancer screening 05/21/2015  . Arm lesion 07/02/2014  . Pain in limb 01/13/2014  . Neuropathic pain of both legs 01/13/2014  . Hip pain 01/12/2014  . Allergic rhinitis 03/10/2013  . Back pain 03/08/2013  . Depression 12/18/2012  . PVD (peripheral vascular disease) ([place]) 01/21/2012  . Hyperlipidemia 12/06/2010  . Overweight(278.02) 12/06/2010  . RESTLESS LEG SYNDROME 10/25/2010  . Migraine without aura 10/07/2010  . Hereditary and idiopathic peripheral neuropathy 10/07/2010  . Essential hypertension, benign 10/07/2010  . DISTURBANCE OF SKIN SENSATION 10/07/2010  . HEART MURMUR, HX OF 10/07/2010  . NEPHROLITHIASIS, HX OF 10/07/2010    Expected Discharge Date: Expected Discharge Date: 10/12/19  Team Members Present: Physician leading conference: Dr. Courtney Heys Social Worker Present: Lennart Pall, LCSW Nurse Present: Dorien Chihuahua, RN;Mekides Nida, RN Case Manager: Karene Fry, RN PT Present: Apolinar Junes, PT OT Present: Elisabeth Most, OT SLP Present: Charolett Bumpers, SLP PPS Coordinator present : Ileana Ladd, Burna Mortimer, SLP     Current Status/Progress Goal Weekly Team Focus  Bowel/Bladder   pt is continent of b/b  remain continent  assist with toiletting and administer prn medications   Swallow/Nutrition/ Hydration             ADL's   UB adl/grooming set up, LB adl min/mod A, toileting max A, sit pivot transfers with R  prosthesis min A, she has declined SB & ant/post transfer attempts  set up/ S LB adl, min A toileting, Supervision/set up transfers  transfer training, adl training, family educ   Mobility   Supervision bed mobility, min A-CGA squat pivot transfers to the R and mod A to the L with R prosthesis donned, supervision w/c mobility >150 feet.  Independent bed mobility, supervision basic transfers and w/c propulsion, min A car and furniture transfers, mod A sit<>stand using LRAD.  Sitting balance with and without R prosthesis, strengthening, activity tolerance, transfers with and without prosthesis, initiate gait if able, tolerance of wearing prosthesis, prosthesis and amputee education, patient/caregiver education   Communication             Safety/Cognition/ Behavioral Observations            Pain   pt c/o pain at 6/1-10 scale   pt pain level; decreased and medication administered with complaints  asses q shift and prn   Skin   wound vac in place on left bka no other skin break down present   not furthur skin break down   asses skin q shift and prn    Rehab Goals Patient on target to meet rehab goals: Yes *See Care Plan and progress notes for long and short-term goals.     Barriers to Discharge  Current Status/Progress Possible Resolutions Date Resolved   Nursing                  PT  Weight bearing restrictions;Behavior  New L BKA and new R prosthesis for BKA, patient with significant fear of falling and poor motor planning with transfers.              OT                  SLP                SW     Plan to discharge home with son and daughter in law who can provide 24/7 assist          Discharge Planning/Teaching Needs:  Home with son and daughter in law  Incision care/dressing changes   Team Discussion: New L BKA, nausea better, wound VAC off today, tachycardia.  RN pain med given, cont, BM 1/25, urine cloudy.  OT refused shower, LB ADL min/mod, UB dressing set up, min/mod LB  dressing, toileting needs assist, has refused some therapies, needs cues, needs family ed with son and dtr-in-law.  PT transfers min/mod to toilet, nausea, try slide board today, S/min A goals.   Revisions to Treatment Plan: N/A     Medical Summary Current Status: some BMs yesterday; urine a little cloudy; Weekly Focus/Goal: refused showers; does sponge bath- min-mod assist for bottom- otherwise S/U; a lot of A after BM; often refuses functional taks- missed a lot of therapy per her own issues  Barriers to Discharge: Behavior;Decreased family/caregiver support;Home enviroment access/layout;Weight bearing restrictions;Wound care;Other (comments)  Barriers to Discharge Comments: R BKA and new L BKA- has R prosthesis- nausea issues as well - pt refuses therapy frequently Possible Resolutions to Barriers: transfers with prosthesis ~ min assist;  PT- poor participation in general; toilet transfers min -mod assist; bed CGA-min assist; overall transfers min assist-CGA to mod assist.   Continued Need for Acute Rehabilitation Level of Care: The patient requires daily medical management by a physician with specialized training in physical medicine and rehabilitation for the following reasons: Direction of a multidisciplinary physical rehabilitation program to maximize functional independence : Yes Medical management of patient stability for increased activity during participation in an intensive rehabilitation regime.: Yes Analysis of laboratory values and/or radiology reports with any subsequent need for medication adjustment and/or medical intervention. : Yes   I attest that I was present, lead the team conference, and concur with the assessment and plan of the team.   Retta Diones 10/04/2019, 7:45 PM   Team conference was held via web/ teleconference due to [place] - 19

## 2019-10-04 NOTE — Progress Notes (Signed)
Physical Therapy Session Note  Patient Details  Name: CHARLET THORNES MRN: [id number] Date of Birth: 1966/07/20  Today's Date: 10/04/2019 PT Missed Time: 91 Minutes Missed Time Reason: Pain;Patient unwilling to participate  Short Term Goals: Week 1:  PT Short Term Goal 1 (Week 1): STG=LTG due to short ELOS  Skilled Therapeutic Interventions/Progress Updates:    Pt seated in w/c upon PT arrival, reports that she is not doing therapy today. She reports that she just got her wound vac off and she is in too much pain. Therapist offered helping patient to the bed or doing some seated exercises and patient continued to decline. Pt missed 60 minutes of skilled therapy tx secondary to pain.   Therapy Documentation Precautions:  Precautions Precautions: Fall Precaution Comments: wound vac Required Braces or Orthoses: (residual limb protector for LLE) Restrictions Weight Bearing Restrictions: No LLE Weight Bearing: Non weight bearing Other Position/Activity Restrictions: limb guard LLE General: PT Amount of Missed Time (min): 60 Minutes PT Missed Treatment Reason: Pain;Patient unwilling to participate    Therapy/Group: Individual Therapy  Netta Corrigan, PT, DPT, CSRS 10/04/2019, 5:04 PM

## 2019-10-04 NOTE — Progress Notes (Signed)
[name] PHYSICAL MEDICINE & REHABILITATION PROGRESS NOTE   Subjective/Complaints:   Pt reports had a little BM yesterday- needs to go again this AM- asking for nurse.   ROS: Patient denies fever, rash, sore throat, blurred vision,  diarrhea, cough, shortness of breath or chest pain,  back pain, headache, or mood change.   Objective:  No results found. No results for input(s): WBC, HGB, HCT, PLT in the last 72 hours. No results for input(s): NA, K, CL, CO2, GLUCOSE, BUN, CREATININE, CALCIUM in the last 72 hours.  Intake/Output Summary (Last 24 hours) at 10/04/2019 0819 Last data filed at 10/03/2019 1854 Gross per 24 hour  Intake 320 ml  Output --  Net 320 ml     Physical Exam: Vital Signs Blood pressure (!) 168/89, pulse (!) 118, temperature 98.6 F (37 C), temperature source Oral, resp. rate 19, height 5\' 2"  (1.575 m), weight 59.9 kg, last menstrual period 02/12/2011, SpO2 99 %. Constitutional: Vital signs reviewed. Sitting up in manual w/c at bedside; nurse just left, then asked to go to bathroom AFTER nurse left,   NAD HEENT: EOMI, oral membranes moist Neck: supple Cardiovascular: RRR without murmur. No JVD    Respiratory: CTA Bilaterally without wheezes or rales. Normal effort    GI: BS +, non-tender, non-distended, soft  Abdomen: Soft, non-tender, non-distended, bowel sounds positive. Extremities: No clubbing, cyanosis, or edema. Pulses are 2+ Neurological: Patient is alert no acute distress.  Oriented x3 and follows commands.  Skin: Left BKA limb guard remains in place.  Right BKA is well-healed, moving well. Wound VAC still in place on LLE  Psych: pleasant  Assessment/Plan: 1. Functional deficits secondary to bilateral BKA which require 3+ hours per day of interdisciplinary therapy in a comprehensive inpatient rehab setting.  Physiatrist is providing close team supervision and 24 hour management of active medical problems listed below.  Physiatrist and rehab team  continue to assess barriers to discharge/monitor patient progress toward functional and medical goals  Care Tool:  Bathing    Body parts bathed by patient: Right arm, Left arm, Chest, Abdomen, Front perineal area, Buttocks, Right upper leg, Left upper leg, Face   Body parts bathed by helper: Buttocks, Right upper leg, Left upper leg Body parts n/a: Left lower leg, Right lower leg   Bathing assist Assist Level: Minimal Assistance - Patient > 75%     Upper Body Dressing/Undressing Upper body dressing   What is the patient wearing?: Pull over shirt    Upper body assist Assist Level: Set up assist    Lower Body Dressing/Undressing Lower body dressing    Lower body dressing activity did not occur: Safety/medical concerns What is the patient wearing?: Pants     Lower body assist Assist for lower body dressing: Moderate Assistance - Patient 50 - 74%     Toileting Toileting Toileting Activity did not occur (Clothing management and hygiene only): N/A (no void or bm)  Toileting assist Assist for toileting: Minimal Assistance - Patient > 75%     Transfers Chair/bed transfer  Transfers assist     Chair/bed transfer assist level: Moderate Assistance - Patient 50 - 74%     Locomotion Ambulation   Ambulation assist   Ambulation activity did not occur: Safety/medical concerns(new L BKA and new R prosthesis, unable to come to a full stance due to decreased strength/balance)          Walk 10 feet activity   Assist  Walk 10 feet activity did not occur:  Safety/medical concerns(new L BKA and new R prosthesis, unable to come to a full stance due to decreased strength/balance)        Walk 50 feet activity   Assist Walk 50 feet with 2 turns activity did not occur: Safety/medical concerns(new L BKA and new R prosthesis, unable to come to a full stance due to decreased strength/balance)         Walk 150 feet activity   Assist Walk 150 feet activity did not occur:  Safety/medical concerns(new L BKA and new R prosthesis, unable to come to a full stand due to decreased strength/balance)         Walk 10 feet on uneven surface  activity   Assist Walk 10 feet on uneven surfaces activity did not occur: Safety/medical concerns(new L BKA and new R prosthesis, unable to come to a full stand due to decreased strength/balance)         Wheelchair     Assist Will patient use wheelchair at discharge?: Yes Type of Wheelchair: Manual    Wheelchair assist level: Supervision/Verbal cueing Max wheelchair distance: 135'    Wheelchair 50 feet with 2 turns activity    Assist        Assist Level: Supervision/Verbal cueing   Wheelchair 150 feet activity     Assist      Assist Level: Supervision/Verbal cueing   Blood pressure (!) 168/89, pulse (!) 118, temperature 98.6 F (37 C), temperature source Oral, resp. rate 19, height 5\' 2"  (1.575 m), weight 59.9 kg, last menstrual period 02/12/2011, SpO2 99 %.  Medical Problem List and Plan: 1.  Decreased functional mobility secondary to left transtibial amputation 09/21/2019 with wound VAC as directed as well as history of right BKA 12/31/2018.  Patient does have a right prosthesis.             -patient may shower but leg must be covered.              -ELOS/Goals: 10-14 days, modI in PT, OT, and I in SLP 2.  Antithrombotics: -DVT/anticoagulation: Patient has bilateral BKA             -antiplatelet therapy: Aspirin 81 mg daily 3. Pain Management: Oxycodone and Robaxin as needed  1/23 fair control. Discussed massage and sensory feedback today 4. Mood: Provide emotional support             -antipsychotic agents: N/A 5. Neuropsych: This patient is capable of making decisions on her own behalf. 6. Skin/Wound Care: Routine skin checks 7. Fluids/Electrolytes/Nutrition: Routine in and outs with follow-up chemistries 8.  CKD stage III.  Baseline creatinine 1.21-2.13.  Follow-up Cr 0.94 on 1/20 9.  CAD  with CABG October 2019.  Continue aspirin therapy 10.  Acute on chronic diastolic congestive heart failure.  Monitor for any signs of fluid overload   Filed Weights   09/27/19 1753  Weight: 59.9 kg    1/26- ordered daily weights 11.  History of intracerebral hemorrhage February 2017.  Follow-up outpatient 12.  Diabetes mellitus with peripheral neuropathy.  Hemoglobin A1c 14.1.  SSI.  Patient on Lantus insulin 50 units twice daily prior to admission.    1/23 resume lantus at 5u bid  1/24 some improvement--monitor for pattern   CBG (last 3)  Recent Labs    10/03/19 1707 10/03/19 2109 10/04/19 0636  GLUCAP 133* 355* 164*    1/26- Will start Lantus 5 units QHS since not in computer. Based on BGs 133-355 13.  Hypertension.  Norvasc 5 mg daily. Systolics in last 2 days ranging from 141 to 171. Consider increasing Norvasc to 10mg  daily.   1/20: Will increase Norvasc to 10mg  for uncontrolled BP.  1/21: Continues to be elevated. Will add Hydralazine 10mg  daily.   1/22: BP and HR still slightly elevated. Will increase Hydralazine to 10mg  BID.   1/24 fair control, nausea may be increasing SBP sl  1/25- SBP 150s this AM- better overall- will monitor 14.  Hyperlipidemia.  Lipitor 15.  Constipation: Docusate 100mg  BID. Has not yet had BM. Consider changing to Senna-docusate BID.   1/20: Still constipated, will add daily Miralax  1/23: had a bm on 1/20, small   -still refusing meds   -continued education by staff, encourage use of softeners/laxative  1/24: discussed with pt today. She realizes she needs to get bowels going   -sorbitol today, SSE if needed  1/25- 3 BMs documented in last 24 hours- hasn't changed nausea in AM 16. Itching:  hydroxyzine prn.    LOS: 7 days A FACE TO FACE EVALUATION WAS PERFORMED  [name] 10/04/2019, 8:19 AM

## 2019-10-04 NOTE — Progress Notes (Signed)
Physical Therapy Session Note  Patient Details  Name: [name] MRN: [id number] Date of Birth: [birth year]  Today's Date: 10/04/2019 PT Individual Time: 0925-1010 PT Individual Time Calculation (min): 45 min   Short Term Goals: Week 1:  PT Short Term Goal 1 (Week 1): STG=LTG due to short ELOS  Skilled Therapeutic Interventions/Progress Updates:     Patient in w/c in room upon PT arrival. Patient alert and agreeable to PT session. Patient reported 4-5/10 L residual limb pain during session, declined pain medicine. PT provided repositioning, rest breaks, and distraction as pain interventions throughout session. She reported feeling nauseous this morning, says this has happened for many years in the morning. Discussed moving patient's schedule to start at 10 to increased participation with therapy with scheduling. Patient initially declined any therapy due to nausea, however, reported that she needed to use the bathroom and agreed to working on toilet transfers with therapist. Patient with R prosthesis and L limb guard donned prior to session.  Therapeutic Activity: Transfers: Patient performed a squat pivot transfer using a grab bar to/from the toilet with min A with improved use of UEs and technique when transferring to the L today. Provided verbal cues for hand placement, head-hips relationship, and lateral leans to pull down/up pants on the toilet with min A. Required supervision and set up assist for peri-care and she was continent of bowl and bladder while on the toilet.   Wheelchair Mobility:  Patient propelled wheelchair 135 feet x2 with supervision for UE strengthening and endurance. Provided verbal cues for larger strokes to improve momentum and UE ROM with mobility.   PT doffed/donned L limb guard with total A due to patient feeling nauseous. Educated on changing and proper placement of shrinker to promote edema control and limb shaping. Discussed changing to her second, clean,  shrinker once wound vac removed today (per patient report). Also educated on d/c planning and mobility required to complete prior to discharge for safety and need for family education. Patient stated concerns about family coming for education due to work schedules. Discussed patient having someone to assist her with all transfers at home, patient identified her son and daughter-in-law and 2 neighbors that will assist her at d/c. Patient declined performing a car transfer in the car simulator on inpatient rehab, discussed trying a car transfer in her families vehicle one day, patient stated she will see if they can come up this weekend. Discussed the importance of working on safe transfers in rehab prior to going home, patient agreeable to attempt standing and transfers in PT session later today. Patient reported increased nausea and asked to end session. Provided a diet ginger-ale and graham crackers for nausea and ended session. Patient missed 30 min of skilled PT due to nausea, will attempt to make up missed time as able.   Patient in w/c in room at end of session with breaks locked and all needs within reach.    Therapy Documentation Precautions:  Precautions Precautions: Fall Precaution Comments: wound vac Required Braces or Orthoses: (residual limb protector for LLE) Restrictions Weight Bearing Restrictions: No LLE Weight Bearing: Non weight bearing Other Position/Activity Restrictions: limb guard LLE    Therapy/Group: Individual Therapy  Isiah Scheel L Jacque Garrels PT, DPT  10/04/2019, 10:17 AM

## 2019-10-04 NOTE — Progress Notes (Signed)
Team Conference Report to Patient/Family  Team Conference discussion was reviewed with the patient and son, including goals, any changes in plan of care and discharge date for 10/12/19.  Patient and son express understanding and are in agreement.  Family education set up for Sunday 10/09/19. Amputee support pad, drop arm commode and slideboard ordered.  Dorien Chihuahua B 10/04/2019, 1:36 PM

## 2019-10-04 NOTE — Progress Notes (Signed)
Occupational Therapy Session Note  Patient Details  Name: [name] MRN: [id number] Date of Birth: Aug 18, 1966  Today's Date: 10/04/2019 OT Individual Time:  -     Attempted to see patient for scheduled therapy session at 1300, offered change of position, functional transfers, toileting, shower, hair washing but she declined due to fatigue and nausea.  She requested to remain seated in w/c.  Second attempt to see patient at 1345.  She states that she continues to feel nauseous and does not want to go to bed or change position/participate in therapy session at this time.    Short Term Goals: Week 1:  OT Short Term Goal 1 (Week 1): na STG = LTG  Skilled Therapeutic Interventions/Progress Updates:      Therapy Documentation Precautions:  Precautions Precautions: Fall Precaution Comments: wound vac Required Braces or Orthoses: (residual limb protector for LLE) Restrictions Weight Bearing Restrictions: No LLE Weight Bearing: Non weight bearing Other Position/Activity Restrictions: limb guard LLE General: General OT Amount of Missed Time: 75 Minutes Vital Signs:      Carlos Levering 10/04/2019, 2:29 PM

## 2019-10-05 ENCOUNTER — Inpatient Hospital Stay (HOSPITAL_COMMUNITY): Payer: BC Managed Care – PPO

## 2019-10-05 LAB — GLUCOSE, CAPILLARY
Glucose-Capillary: 159 mg/dL — ABNORMAL HIGH (ref 70–99)
Glucose-Capillary: 197 mg/dL — ABNORMAL HIGH (ref 70–99)
Glucose-Capillary: 218 mg/dL — ABNORMAL HIGH (ref 70–99)
Glucose-Capillary: 152 mg/dL — ABNORMAL HIGH (ref 70–99)

## 2019-10-05 NOTE — Progress Notes (Signed)
[place] PHYSICAL MEDICINE & REHABILITATION PROGRESS NOTE   Subjective/Complaints:    Got wound VAC off; Says LLE is numb- has hiccups.     ROS: Patient denies fever, rash, sore throat, blurred vision,  diarrhea, cough, shortness of breath or chest pain,  back pain, headache, or mood change.   Objective:  No results found. No results for input(s): WBC, HGB, HCT, PLT in the last 72 hours. No results for input(s): NA, K, CL, CO2, GLUCOSE, BUN, CREATININE, CALCIUM in the last 72 hours.  Intake/Output Summary (Last 24 hours) at 10/05/2019 0841 Last data filed at 10/04/2019 1901 Gross per 24 hour  Intake 300 ml  Output --  Net 300 ml     Physical Exam: Vital Signs Blood pressure (!) 169/93, pulse 88, temperature 98 F (36.7 C), temperature source Oral, resp. rate 17, height 5\' 2"  (1.575 m), weight 60.1 kg, last menstrual period 02/12/2011, SpO2 97 %. Constitutional: Vital signs reviewed. Sitting up in manual w/c in dark; no lights on; ,   NAD HEENT: EOMI, oral membranes moist Neck: supple Cardiovascular: RRR without murmur. No JVD    Respiratory: CTA Bilaterally without wheezes or rales. Normal effort    GI: BS +, non-tender, non-distended, soft  Abdomen: Soft, non-tender, non-distended, bowel sounds positive. Extremities: No clubbing, cyanosis, or edema. Pulses are 2+ Neurological: Patient is alert no acute distress.  Oriented x3 and follows commands.  Skin: .  Right BKA is well-healed, moving well.  LLE has stump shrinker in place; no limb guard in place; wound VAC off.  Psych: pleasant  Assessment/Plan: 1. Functional deficits secondary to bilateral BKA which require 3+ hours per day of interdisciplinary therapy in a comprehensive inpatient rehab setting.  Physiatrist is providing close team supervision and 24 hour management of active medical problems listed below.  Physiatrist and rehab team continue to assess barriers to discharge/monitor patient progress toward  functional and medical goals  Care Tool:  Bathing    Body parts bathed by patient: Right arm, Left arm, Chest, Abdomen, Front perineal area, Buttocks, Right upper leg, Left upper leg, Face   Body parts bathed by helper: Buttocks, Right upper leg, Left upper leg Body parts n/a: Left lower leg, Right lower leg   Bathing assist Assist Level: Minimal Assistance - Patient > 75%     Upper Body Dressing/Undressing Upper body dressing   What is the patient wearing?: Hospital gown only    Upper body assist Assist Level: Set up assist    Lower Body Dressing/Undressing Lower body dressing    Lower body dressing activity did not occur: Safety/medical concerns What is the patient wearing?: Incontinence brief     Lower body assist Assist for lower body dressing: Moderate Assistance - Patient 50 - 74%     Toileting Toileting Toileting Activity did not occur (Clothing management and hygiene only): N/A (no void or bm)  Toileting assist Assist for toileting: Minimal Assistance - Patient > 75%     Transfers Chair/bed transfer  Transfers assist     Chair/bed transfer assist level: Moderate Assistance - Patient 50 - 74%     Locomotion Ambulation   Ambulation assist   Ambulation activity did not occur: Safety/medical concerns(new L BKA and new R prosthesis, unable to come to a full stance due to decreased strength/balance)          Walk 10 feet activity   Assist  Walk 10 feet activity did not occur: Safety/medical concerns(new L BKA and new R prosthesis, unable  to come to a full stance due to decreased strength/balance)        Walk 50 feet activity   Assist Walk 50 feet with 2 turns activity did not occur: Safety/medical concerns(new L BKA and new R prosthesis, unable to come to a full stance due to decreased strength/balance)         Walk 150 feet activity   Assist Walk 150 feet activity did not occur: Safety/medical concerns(new L BKA and new R prosthesis,  unable to come to a full stand due to decreased strength/balance)         Walk 10 feet on uneven surface  activity   Assist Walk 10 feet on uneven surfaces activity did not occur: Safety/medical concerns(new L BKA and new R prosthesis, unable to come to a full stand due to decreased strength/balance)         Wheelchair     Assist Will patient use wheelchair at discharge?: Yes Type of Wheelchair: Manual    Wheelchair assist level: Supervision/Verbal cueing Max wheelchair distance: 135'    Wheelchair 50 feet with 2 turns activity    Assist        Assist Level: Supervision/Verbal cueing   Wheelchair 150 feet activity     Assist      Assist Level: Supervision/Verbal cueing   Blood pressure (!) 169/93, pulse 88, temperature 98 F (36.7 C), temperature source Oral, resp. rate 17, height 5\' 2"  (1.575 m), weight 60.1 kg, last menstrual period 02/12/2011, SpO2 97 %.  Medical Problem List and Plan: 1.  Decreased functional mobility secondary to left transtibial amputation 09/21/2019 with wound VAC as directed as well as history of right BKA 12/31/2018.  Patient does have a right prosthesis.             -patient may shower but leg must be covered.              -ELOS/Goals: 10-14 days, modI in PT, OT, and I in SLP 2.  Antithrombotics: -DVT/anticoagulation: Patient has bilateral BKA             -antiplatelet therapy: Aspirin 81 mg daily 3. Pain Management: Oxycodone and Robaxin as needed  1/23 fair control. Discussed massage and sensory feedback today 4. Mood: Provide emotional support             -antipsychotic agents: N/A 5. Neuropsych: This patient is capable of making decisions on her own behalf. 6. Skin/Wound Care: Routine skin checks 7. Fluids/Electrolytes/Nutrition: Routine in and outs with follow-up chemistries 8.  CKD stage III.  Baseline creatinine 1.21-2.13.  Follow-up Cr 0.94 on 1/20 9.  CAD with CABG October 2019.  Continue aspirin therapy 10.   Acute on chronic diastolic congestive heart failure.  Monitor for any signs of fluid overload   Filed Weights   09/27/19 1753 10/05/19 0600  Weight: 59.9 kg 60.1 kg    1/26- ordered daily weights  1/27- weight stable.  11.  History of intracerebral hemorrhage February 2017.  Follow-up outpatient 12.  Diabetes mellitus with peripheral neuropathy.  Hemoglobin A1c 14.1.  SSI.  Patient on Lantus insulin 50 units twice daily prior to admission.    1/23 resume lantus at 5u bid  1/24 some improvement--monitor for pattern   CBG (last 3)  Recent Labs    10/04/19 1650 10/04/19 2106 10/05/19 0627  GLUCAP 111* 171* 159*    1/26- Will start Lantus 5 units QHS since not in computer. Based on BGs 133-355 13.  Hypertension.  Norvasc  5 mg daily. Systolics in last 2 days ranging from 141 to 171. Consider increasing Norvasc to 10mg  daily.   1/20: Will increase Norvasc to 10mg  for uncontrolled BP.  1/21: Continues to be elevated. Will add Hydralazine 10mg  daily.   1/22: BP and HR still slightly elevated. Will increase Hydralazine to 10mg  BID.   1/24 fair control, nausea may be increasing SBP sl  1/25- SBP 150s this AM- better overall- will monitor 14.  Hyperlipidemia.  Lipitor 15.  Constipation: Docusate 100mg  BID. Has not yet had BM. Consider changing to Senna-docusate BID.   1/20: Still constipated, will add daily Miralax  1/23: had a bm on 1/20, small   -still refusing meds   -continued education by staff, encourage use of softeners/laxative  1/24: discussed with pt today. She realizes she needs to get bowels going   -sorbitol today, SSE if needed  1/25- 3 BMs documented in last 24 hours- hasn't changed nausea in AM 16. Itching:  hydroxyzine prn.    LOS: 8 days A FACE TO FACE EVALUATION WAS PERFORMED  [name] 10/05/2019, 8:41 AM

## 2019-10-05 NOTE — Progress Notes (Signed)
Physical Therapy Weekly Progress Note  Patient Details  Name: [name] MRN: [id number] Date of Birth: [birth year]  Beginning of progress report period: September 28, 2019 End of progress report period: October 05, 2019  Today's Date: 10/05/2019 PT Individual Time: 1000-1056 PT Individual Time Calculation (min): 56 min   Patient has had slow progress towards her long term goals, limited by nausea and decreased participation in therapies. She often refuses to perform transfers due to fear of falling despite heavy education on need to demonstrate safe transfers in rehab prior to d/c.  She currently requires min A-supervision for bed mobility, min A-CGA for squat pivot transfers to the R and mod-min A to the L, and supervision for w/c mobility. Education on pressure relief, desensitization, limb shaping/edema control, prosthesis wearing schedule, pain management strategies, importance of ROM, and protection and inspection of residual limbs have been initiated and require further reinforcement.   Patient continues to demonstrate the following deficits muscle weakness and muscle joint tightness, decreased cardiorespiratoy endurance, impaired timing and sequencing and decreased motor planning, decreased visual acuity, decreased problem solving and decreased memory and decreased sitting balance, decreased standing balance, decreased postural control, decreased balance strategies and difficulty maintaining precautions and therefore will continue to benefit from skilled PT intervention to increase functional independence with mobility.  Patient progressing toward long term goals..  Plan of care revisions: extensded ELOS due to slow progress.  PT Short Term Goals Week 1:  PT Short Term Goal 1 (Week 1): STG=LTG due to short ELOS Week 2:  PT Short Term Goal 1 (Week 2): STG=LTG due to short ELOS.  Skilled Therapeutic Interventions/Progress Updates:     Patient in w/c upon PT arrival. Patient alert and  agreeable to PT session. Patient denied pain during session, however, she reported moderate nausea this morning. R prosthesis donned and L limb guard doffed at beginning of session. Patient donned limb guard with min cues and supervision.   Therapeutic Activity: Transfers: Patient performed a squat pivot transfer to the R with CGA-close supervision. She then attempted a squat pivot transfer to the L, however, she was unable to lift her hips high enough to safely transfer without increased assist to the L. Worked on scootting EOM, see NMR below. Patient stated that she did not want to use a slide board due to fear that she would slide off the board. PT demonstrated slide board transfers to the L to a w/c x2 to demonstrate safety and proper technique to prevent falls when using her R prosthesis. The patient became tearful after the first demonstration, stating "I just have a hard time processing all this." Provided comfort to the patient and encouragement about her progress and her goals. She then asked PT to demonstrate again. Following the second demonstration she performed a slide board transfer from the mat table to the w/c with CGA and total A for board placement. Provided cues for hand placement, board placement, w/c set up, and head-hips relationship for proper technique and decreased assist with transfers. Discussed attempted transfers without her R prosthesis in the event of an emergency or if something happens to her limb or prosthesis and she cannot wear it. Patient reports feeling very anxious about falling without her prosthesis on, however, willing to continue discussing this during next session.  Wheelchair Mobility:  Patient propelled wheelchair 155 feet x2 with supervision-mod I. Provided verbal cues for management of limb pad, leg rest, and breaks throughout session.   Neuromuscular Re-ed: Patient performed the following  seated activities: She performed scooting R/L along the full long side  of the mat table x2 with a 3" step stool under her R prosthesis for increased weight bearing. Provided manual facilitation for increased forward lean and educated on head-hips relationship. Patient with increased forward weight shift, however, very apprehensive about coming forward and limited hip clearance from mat. Discussed limited ability to clear hips for squat pivot transfers without increased forward weight shift.   Patient in w/c in room at end of session with breaks locked, seat belt alarm set, and all needs within reach. Patient asked to return to the room following performing a slide board transfer, see above, due to nausea and increased anxiety. Patient disclosed that she has had a very difficulty 2 years with several family illnesses and deaths and that the loss of both of her limbs has been very emotional and stressful. PT comforted patient and provided education on possibilities for mobility and independence at w/c and ambulatory levels with B prosthesis. Patient reported feeling better after and agreed to continue working on safe transfers. She stated "if I do a transfer with the board each day, I will feel better about it."   Therapy Documentation Precautions:  Precautions Precautions: Fall Precaution Comments: wound vac Required Braces or Orthoses: (residual limb protector for LLE) Restrictions Weight Bearing Restrictions: No LLE Weight Bearing: Non weight bearing Other Position/Activity Restrictions: limb guard LLE   Therapy/Group: Individual Therapy  [name] L Satya Buttram PT, DPT  10/05/2019, 3:51 PM

## 2019-10-05 NOTE — Progress Notes (Signed)
Occupational Therapy Session Note  Patient Details  Name: [name] MRN: [id number] Date of Birth: Aug 24, 1966  Today's Date: 10/05/2019 OT Individual Time: 0045-9977 OT Individual Time Calculation (min): 42 min  and Today's Date: 10/05/2019 OT Missed Time: 62 Minutes Missed Time Reason: Patient unwilling/refused to participate without medical reason   Short Term Goals: Week 1:  OT Short Term Goal 1 (Week 1): na STG = LTG  Skilled Therapeutic Interventions/Progress Updates:    1;1. Pt received in w/c with no pain reported Pt and OT discuss bathroom set up, transfers types and set up for home. Pt reporting being pleased with using slide baord in PT earlier but declines attempting with OT. OT educates on anterior/posterior transfer and importance to practice in case pt unable to wear prosthesis at some point at home, however politely but adamantly declines to practice. Offered therex and ADL , however pt declines all tx. Exited session with pt missing 45 min skilled OT.  Session 2: 303-345 42 min Pt received in w/c delicing ot of w/c activities performing SLR/LAQ upon entering. Pt attempting to rub limb and provided education to doff limb guard when completing desensitization techniques to improve overall effectiveness. Pt able to doff and don limb guard with min VC. Pt provided with handout of desensitization techniques with uses of various fabris, tapping and rolling an empty deodorant roller on sensitive area. Pt able to demo tapping and rubbing strategies. Pt able to teach back pressure relief strategies when asked. Pt declined any further tx, therex, or ADL training missing 17 min of tx. Exited session with pt seated in w/c, call light in reach and all needs met.  Therapy Documentation Precautions:  Precautions Precautions: Fall Precaution Comments: wound vac Required Braces or Orthoses: (residual limb protector for LLE) Restrictions Weight Bearing Restrictions: No LLE Weight  Bearing: Non weight bearing Other Position/Activity Restrictions: limb guard LLE General:   Vital Signs: Therapy Vitals Temp: 98.1 F (36.7 C) Pulse Rate: 82 Resp: 18 BP: 138/78 Patient Position (if appropriate): Sitting Oxygen Therapy SpO2: 100 % O2 Device: Room Air Pain: Pain Assessment Pain Scale: 0-10 Pain Score: 0-No pain ADL: ADL Eating: Independent Where Assessed-Eating: Wheelchair Grooming: Setup Where Assessed-Grooming: Sitting at sink, Wheelchair Upper Body Bathing: Setup Where Assessed-Upper Body Bathing: Wheelchair, Sitting at sink Lower Body Bathing: Maximal assistance Where Assessed-Lower Body Bathing: Sitting at sink, Wheelchair Upper Body Dressing: Supervision/safety, Setup Where Assessed-Upper Body Dressing: Wheelchair ADL Comments: patient declined LB dressing due to bowel issues today Vision   Perception    Praxis   Exercises:   Other Treatments:     Therapy/Group: Individual Therapy  Tonny Branch 10/05/2019, 2:16 PM

## 2019-10-06 ENCOUNTER — Inpatient Hospital Stay (HOSPITAL_COMMUNITY): Payer: BC Managed Care – PPO

## 2019-10-06 ENCOUNTER — Inpatient Hospital Stay (HOSPITAL_COMMUNITY): Payer: BC Managed Care – PPO | Admitting: Physical Therapy

## 2019-10-06 ENCOUNTER — Telehealth: Payer: Self-pay

## 2019-10-06 ENCOUNTER — Inpatient Hospital Stay (HOSPITAL_COMMUNITY): Payer: BC Managed Care – PPO | Admitting: Occupational Therapy

## 2019-10-06 LAB — URINALYSIS, ROUTINE W REFLEX MICROSCOPIC
Bilirubin Urine: NEGATIVE
Glucose, UA: 50 mg/dL — AB
Ketones, ur: NEGATIVE mg/dL
Nitrite: POSITIVE — AB
Protein, ur: 100 mg/dL — AB
Specific Gravity, Urine: 1.016 (ref 1.005–1.030)
WBC, UA: >50 WBC/hpf — ABNORMAL HIGH (ref 0–5)
pH: 5 (ref 5.0–8.0)

## 2019-10-06 LAB — GLUCOSE, CAPILLARY
Glucose-Capillary: 145 mg/dL — ABNORMAL HIGH (ref 70–99)
Glucose-Capillary: 191 mg/dL — ABNORMAL HIGH (ref 70–99)
Glucose-Capillary: 158 mg/dL — ABNORMAL HIGH (ref 70–99)
Glucose-Capillary: 242 mg/dL — ABNORMAL HIGH (ref 70–99)

## 2019-10-06 MED ORDER — CEPHALEXIN 250 MG PO CAPS
250.0000 mg | ORAL_CAPSULE | Freq: Four times a day (QID) | ORAL | Status: DC
  Administered 2019-10-06 – 2019-10-08 (×8): 250 mg via ORAL
  Filled 2019-10-06 (×8): qty 1

## 2019-10-06 NOTE — Progress Notes (Signed)
Physical Therapy Session Note  Patient Details  Name: [name] MRN: [id number] Date of Birth: 1966/01/04  Today's Date: 10/06/2019 PT Individual Time: 2060-1561 PT Individual Time Calculation (min): 55 min   Short Term Goals: Week 2:  PT Short Term Goal 1 (Week 2): STG=LTG due to short ELOS.  Skilled Therapeutic Interventions/Progress Updates:   Pt received sitting in WC and agreeable to PT. Pt performed WC mobility through hall x 155f, 1061fand 751fith supervision assist and min cues through doorway to prevent hitting R arm on door frame.   Pt declined to perform any transfer training with SB or A/P. Stated that her RLE wound is not looking well and wants to avoid irritating it in any way until the Othopedic MD is able to assess residual limb.   PT treatment focused on UE therex:  Press [facility] x[facility]. UE shuttle laterally 2 x 1 min. UE shuttle Vertically 2 x 1 min for BUE. Cues for posture, full ROM, decreased speed of eccentric movement, and symmetry of movement for BUE.  UBE random resistance 5 min forward, 5 min reverse with 1 prolonged therapeutic rest break between forward and reverse. Cues for increased RPM as tolerated to maximize endurance benefits of exercise.   [facility] returned to room and [facility] and all needs met.         Therapy Documentation Precautions:  Precautions Precautions: Fall Precaution Comments: wound vac Required Braces or Orthoses: (residual limb protector for LLE) Restrictions Weight Bearing Restrictions: No LLE Weight Bearing: Non weight bearing Other Position/Activity Restrictions: limb guard LLE    Vital Signs: Therapy Vitals Temp: 98.4 F (36.9 C) Temp Source: Oral Pulse Rate: 87 Resp: 18 BP: 135/80 Patient Position (if appropriate): Sitting Oxygen Therapy SpO2: 100 % O2 Device: Room Air Pain: Pain Assessment Pain Scale: 0-10 Pain Score: 2  Pain Type: Surgical pain Pain  Location: Leg Pain Orientation: Left Pain Descriptors / Indicators: Discomfort Pain Intervention(s): Repositioned   Therapy/Group: Individual Therapy  AusLorie Phenix28/2021, 5:24 PM

## 2019-10-06 NOTE — Progress Notes (Addendum)
[facility] PHYSICAL MEDICINE & REHABILITATION PROGRESS NOTE   Subjective/Complaints:    Urine smells bad- legs looking good, per pt.     ROS: Patient denies fever, rash, sore throat, blurred vision,  diarrhea, cough, shortness of breath or chest pain,  back pain, headache, or mood change.   Objective:  No results found. No results for input(s): WBC, HGB, HCT, PLT in the last 72 hours. No results for input(s): NA, K, CL, CO2, GLUCOSE, BUN, CREATININE, CALCIUM in the last 72 hours.  Intake/Output Summary (Last 24 hours) at 10/06/2019 [id number] Last data filed at 10/06/2019 0730 Gross per 24 hour  Intake 500 ml  Output --  Net 500 ml     Physical Exam: Vital Signs Blood pressure (!) 136/116, pulse (!) 101, temperature 98.6 F (37 C), temperature source Oral, resp. rate 17, height 5\' 2"  (1.575 m), weight 60.1 kg, last menstrual period 02/12/2011, SpO2 100 %. Constitutional: Vital signs reviewed. Sitting up in manual w/c;;  watching TV; NAD HEENT: EOMI, oral membranes moist Neck: supple Cardiovascular: RRR without murmur. No JVD    Respiratory: CTA Bilaterally without wheezes or rales. Normal effort    GI: BS +, non-tender, non-distended, soft  Abdomen: Soft, non-tender, non-distended, bowel sounds positive. Extremities: No clubbing, cyanosis, or edema. Pulses are 2+ Neurological: Patient is alert no acute distress.  Oriented x3 and follows commands.  Skin: .  Right BKA is well-healed, moving well.  LLE has stump shrinker in place;  limb guard in place; wound VAC off.  Psych: pleasant  Assessment/Plan: 1. Functional deficits secondary to bilateral BKA which require 3+ hours per day of interdisciplinary therapy in a comprehensive inpatient rehab setting.  Physiatrist is providing close team supervision and 24 hour management of active medical problems listed below.  Physiatrist and rehab team continue to assess barriers to discharge/monitor patient progress toward functional and  medical goals  Care Tool:  Bathing    Body parts bathed by patient: Right arm, Left arm, Chest, Abdomen, Front perineal area, Buttocks, Right upper leg, Left upper leg, Face   Body parts bathed by helper: Buttocks, Right upper leg, Left upper leg Body parts n/a: Left lower leg, Right lower leg   Bathing assist Assist Level: Minimal Assistance - Patient > 75%     Upper Body Dressing/Undressing Upper body dressing   What is the patient wearing?: Hospital gown only    Upper body assist Assist Level: Set up assist    Lower Body Dressing/Undressing Lower body dressing    Lower body dressing activity did not occur: Safety/medical concerns What is the patient wearing?: Incontinence brief     Lower body assist Assist for lower body dressing: Moderate Assistance - Patient 50 - 74%     Toileting Toileting Toileting Activity did not occur Landscape architect and hygiene only): N/A (no void or bm)  Toileting assist Assist for toileting: Minimal Assistance - Patient > 75%     Transfers Chair/bed transfer  Transfers assist     Chair/bed transfer assist level: Contact Guard/Touching assist     Locomotion Ambulation   Ambulation assist   Ambulation activity did not occur: Safety/medical concerns(new L BKA and new R prosthesis, unable to come to a full stance due to decreased strength/balance)          Walk 10 feet activity   Assist  Walk 10 feet activity did not occur: Safety/medical concerns(new L BKA and new R prosthesis, unable to come to a full stance due to decreased strength/balance)  Walk 50 feet activity   Assist Walk 50 feet with 2 turns activity did not occur: Safety/medical concerns(new L BKA and new R prosthesis, unable to come to a full stance due to decreased strength/balance)         Walk 150 feet activity   Assist Walk 150 feet activity did not occur: Safety/medical concerns(new L BKA and new R prosthesis, unable to come to a full  stand due to decreased strength/balance)         Walk 10 feet on uneven surface  activity   Assist Walk 10 feet on uneven surfaces activity did not occur: Safety/medical concerns(new L BKA and new R prosthesis, unable to come to a full stand due to decreased strength/balance)         Wheelchair     Assist Will patient use wheelchair at discharge?: Yes Type of Wheelchair: Manual    Wheelchair assist level: Set up assist Max wheelchair distance: 155'    Wheelchair 50 feet with 2 turns activity    Assist        Assist Level: Set up assist   Wheelchair 150 feet activity     Assist      Assist Level: Set up assist   Blood pressure (!) 136/116, pulse (!) 101, temperature 98.6 F (37 C), temperature source Oral, resp. rate 17, height 5\' 2"  (1.575 m), weight 60.1 kg, last menstrual period 02/12/2011, SpO2 100 %.  Medical Problem List and Plan: 1.  Decreased functional mobility secondary to left transtibial amputation 09/21/2019 with wound VAC as directed as well as history of right BKA 12/31/2018.  Patient does have a right prosthesis.             -patient may shower but leg must be covered.              -ELOS/Goals: 10-14 days, modI in PT, OT, and I in SLP 2.  Antithrombotics: -DVT/anticoagulation: Patient has bilateral BKA             -antiplatelet therapy: Aspirin 81 mg daily 3. Pain Management: Oxycodone and Robaxin as needed  1/23 fair control. Discussed massage and sensory feedback today 4. Mood: Provide emotional support             -antipsychotic agents: N/A 5. Neuropsych: This patient is capable of making decisions on her own behalf. 6. Skin/Wound Care: Routine skin checks 7. Fluids/Electrolytes/Nutrition: Routine in and outs with follow-up chemistries 8.  CKD stage III.  Baseline creatinine 1.21-2.13.  Follow-up Cr 0.94 on 1/20 9.  CAD with CABG October 2019.  Continue aspirin therapy 10.  Acute on chronic diastolic congestive heart failure.   Monitor for any signs of fluid overload   Filed Weights   09/27/19 1753 10/05/19 0600  Weight: 59.9 kg 60.1 kg    1/26- ordered daily weights  1/27- weight stable.  11.  History of intracerebral hemorrhage February 2017.  Follow-up outpatient 12.  Diabetes mellitus with peripheral neuropathy.  Hemoglobin A1c 14.1.  SSI.  Patient on Lantus insulin 50 units twice daily prior to admission.    1/23 resume lantus at 5u bid  1/24 some improvement--monitor for pattern   CBG (last 3)  Recent Labs    10/05/19 1825 10/05/19 2112 10/06/19 0636  GLUCAP 218* 152* 145*    1/26- Will start Lantus 5 units QHS since not in computer. Based on BGs 133-355  1/28- bgS 145-218 13.  Hypertension.  Norvasc 5 mg daily. Systolics in last 2 days ranging  from 141 to 171. Consider increasing Norvasc to 10mg  daily.   1/20: Will increase Norvasc to 10mg  for uncontrolled BP.  1/21: Continues to be elevated. Will add Hydralazine 10mg  daily.   1/22: BP and HR still slightly elevated. Will increase Hydralazine to 10mg  BID.   1/24 fair control, nausea may be increasing SBP sl  1/25- SBP 150s this AM- better overall- will monitor  1/28- BP 136/113-  14.  Hyperlipidemia.  Lipitor 15.  Constipation: Docusate 100mg  BID. Has not yet had BM. Consider changing to Senna-docusate BID.   1/20: Still constipated, will add daily Miralax  1/23: had a bm on 1/20, small   -still refusing meds   -continued education by staff, encourage use of softeners/laxative  1/24: discussed with pt today. She realizes she needs to get bowels going   -sorbitol today, SSE if needed  1/25- 3 BMs documented in last 24 hours- hasn't changed nausea in AM  1/28- 2 BMs 1/27 16. Itching:  hydroxyzine prn.  17. Check U/A and Cx per pt request- urine smell bad- possibly having some stinging with urination, sent UA and Cx   LOS: 9 days A FACE TO FACE EVALUATION WAS PERFORMED  [name] 10/06/2019, 9:22 AM

## 2019-10-06 NOTE — Telephone Encounter (Signed)
Dr Alveta Heimlich was called and informed that Dr [name] Given will be in to see patient tomorrow morning

## 2019-10-06 NOTE — Progress Notes (Addendum)
Physical Therapy Session Note  Patient Details  Name: [name] MRN: [id number] Date of Birth: 1966/08/19  Today's Date: 10/06/2019 PT Individual Time: 1000-1117 PT Individual Time Calculation (min): 77 min   Short Term Goals: Week 2:  PT Short Term Goal 1 (Week 2): STG=LTG due to short ELOS.  Skilled Therapeutic Interventions/Progress Updates:     Patient in w/c in room upon PT arrival. Patient alert and agreeable to PT session. Patient denied pain during session. She reported that she thinks that she may have a UTI and nursing confirmed that they need a urine sample if she went to the bathroom during session. Patient agreeable to UE exercises at beginning of session due to fatigue. Patient propelled wheelchair 85 feet x2 with supervision. She performed the UE ergometer on level 4 for 4 min for a total of 0.2 miles for UE strengthening and endurance. During UE exercises discussed patient's wearing schedule for her R prosthesis, patient reported that she slept in her prosthesis sitting in the chair last night. PT reinforced prior education about doffing her prosthesis at night and wearing a shrinker due to concerns with skin integrity. Had patient doff her prosthesis and liner during session with supervision. Noted moist skin and increased redness at patella and medial side of her incision scar where she has a hard callus. Patient washed her liner and limb with set-up assist. While washing, the hard callus fell off revealing healthy pink skin underneath. Educated on monitoring this spot for signs of pressure. Instructed patient and RN that the patient should not wear her prosthesis today to allow her skin to recover from wearing it all night. Had patient remove her L limb guard to assess her L LE as well. She doffed the L limb guard and shrinker with supervision for cues and PT doffed the dressing with only mild drainage on medial side. Patient's L residual limb presented with some necrotic  tissue along the incision site and increased erythema spreading ~3 centimeters from the incision. Marjorie Smolder, RN and Dr. Dagoberto Ligas, MD made aware and Dr. Dagoberto Ligas took pictures to document in the patient's chart and called the surgeon for a consult. RN re-dressed the patients L residual limb as PT provided education on signs and symptoms of infection. Patient became tearful and stated, "I am not having any more surgery." PT comforted the patient and provided education on coping strategies in times of stress. Patient stated that she felt better. She then stated that she needed to use the bathroom. She performed a P/A transfer w/c>bed with CGA and cues for technique, w/c set up and safety. She then refused to perform a A/P transfer to the [facility] and requested the bed pan. RN made aware. Patient in bed at end of session with breaks locked, bed alarm set, and all needs within reach.    Therapy Documentation Precautions:  Precautions Precautions: Fall Precaution Comments: wound vac Required Braces or Orthoses: (residual limb protector for LLE) Restrictions Weight Bearing Restrictions: No LLE Weight Bearing: Non weight bearing Other Position/Activity Restrictions: limb guard LLE    Therapy/Group: Individual Therapy  Shylyn Younce L Greogry Goodwyn PT, DPT  10/06/2019, 3:54 PM

## 2019-10-06 NOTE — Telephone Encounter (Signed)
Dan with [facility] called on behalf of Dr. Dagoberto Ligas concerning patient's left stump.  Stated that limb doesn't look to good.  Patient had left BKA on 09/21/2019.  Would like to know if someone would come by to see patient in room 79M-13.  CB# [phone] for Dr. Dagoberto Ligas.  Please advise.  Thank you.

## 2019-10-06 NOTE — Progress Notes (Signed)
PT called RN to room to check on her left leg  incision; Site was red and some drainage noted MD notified.

## 2019-10-06 NOTE — Progress Notes (Signed)
Pam Love PA  notified of U/A results.

## 2019-10-06 NOTE — Progress Notes (Signed)
Occupational Therapy Session Note  Patient Details  Name: [name] MRN: [id number] Date of Birth: 08-Jun-1966  Today's Date: 10/06/2019 OT Individual Time: 1300-1350 OT Individual Time Calculation (min): 50 min    Short Term Goals: Week 1:  OT Short Term Goal 1 (Week 1): na STG = LTG  Skilled Therapeutic Interventions/Progress Updates:    Patient seated in w/c, bilateral LEs supported on bed.  Patient declined bathing, hair washing, changing clothing, transfers, w/c mobility.  She states that she does not want to leave the room and will not get out of the w/c at this time.  She is willing to complete UB conditioning activities - completed shoulder, elbow and wrist exercises with 3# dowel.  Completed w/c push ups and lateral leans with cues for technique and hand placement.  Reviewed weight shifts and practiced at the w/c level, reviewed limb care/safety with positioning and use of prosthesis.  Reviewed importance of transfer training utilizing various techniques so that she has options both with and without prosthesis - patient states that she will think about it but only wants to transfer with right prosthesis on.  Reviewed goals for therapy and safe set up of equipment and home environment.  She continues to decline further activity at this time missing 25 minutes of scheduled session.  She remained in the w/c with call bell in reach.    Therapy Documentation Precautions:  Precautions Precautions: Fall Precaution Comments: wound vac Required Braces or Orthoses: (residual limb protector for LLE) Restrictions Weight Bearing Restrictions: No LLE Weight Bearing: Non weight bearing Other Position/Activity Restrictions: limb guard LLE General: General OT Amount of Missed Time: 25 Minutes Vital Signs:  Pain: Pain Assessment Pain Scale: 0-10 Pain Score: 2  Pain Type: Surgical pain Pain Location: Leg Pain Orientation: Left Pain Descriptors / Indicators: Discomfort Pain  Intervention(s): Repositioned Other Treatments:     Therapy/Group: Individual Therapy  Carlos Levering 10/06/2019, 2:05 PM

## 2019-10-07 ENCOUNTER — Inpatient Hospital Stay (HOSPITAL_COMMUNITY): Payer: BC Managed Care – PPO | Admitting: Physical Therapy

## 2019-10-07 ENCOUNTER — Inpatient Hospital Stay (HOSPITAL_COMMUNITY): Payer: BC Managed Care – PPO | Admitting: Occupational Therapy

## 2019-10-07 LAB — GLUCOSE, CAPILLARY
Glucose-Capillary: 197 mg/dL — ABNORMAL HIGH (ref 70–99)
Glucose-Capillary: 177 mg/dL — ABNORMAL HIGH (ref 70–99)
Glucose-Capillary: 272 mg/dL — ABNORMAL HIGH (ref 70–99)

## 2019-10-07 MED ORDER — NITROGLYCERIN 0.2 MG/HR TD PT24
0.2000 mg | MEDICATED_PATCH | Freq: Every day | TRANSDERMAL | Status: DC
  Administered 2019-10-07 – 2019-10-12 (×6): 0.2 mg via TRANSDERMAL
  Filled 2019-10-07 (×6): qty 1

## 2019-10-07 NOTE — Progress Notes (Signed)
[facility] PHYSICAL MEDICINE & REHABILITATION PROGRESS NOTE   Subjective/Complaints:  Pt said no issues with urine- however U/A showed (+) nitrites, mod leuks and >50 WBCs-   Per Dr Jess Barters PA from Vascular, are going to place a Nitropatch on the BKA to improve blood flow and there's to be no dressing under the shrinker sock which has "medicine in it".  Pt also admits her R knee is hurting today- admits likely due ot sleeping in prothesis the other day, but doesn't want ot do therapy because knee pain-  Suggested taking pain meds and still working with therapy.  Vascular also suggested Trental at d/c once off Lovenox.    ROS: Patient denies fever, rash, sore throat, blurred vision,  diarrhea, cough, shortness of breath or chest pain,  back pain, headache, or mood change.   Objective:  No results found. No results for input(s): WBC, HGB, HCT, PLT in the last 72 hours. No results for input(s): NA, K, CL, CO2, GLUCOSE, BUN, CREATININE, CALCIUM in the last 72 hours.  Intake/Output Summary (Last 24 hours) at 10/07/2019 1302 Last data filed at 10/07/2019 0900 Gross per 24 hour  Intake 700 ml  Output -  Net 700 ml     Physical Exam: Vital Signs Blood pressure (!) 156/87, pulse 91, temperature 98.8 F (37.1 C), temperature source Oral, resp. rate 17, height 5\' 2"  (1.575 m), weight 64.1 kg, last menstrual period 02/12/2011, SpO2 99 %. Constitutional: Vital signs reviewed. Sitting up in manual w/c; PA from vascular in room; pt tearful due to scared of surgery NAD HEENT: EOMI, oral membranes moist Neck: supple Cardiovascular: RRR without murmur. No JVD    Respiratory: CTA Bilaterally without wheezes or rales. Normal effort    GI: BS +, non-tender, non-distended, soft  Abdomen: Soft, non-tender, non-distended, bowel sounds positive. Extremities: No clubbing, cyanosis, or edema. Pulses are 2+ Neurological: Patient is alert no acute distress.  Oriented x3 and follows commands.  Skin: .   Right BKA is well-healed, moving well.  LLE has stump shrinker sock in place- taken off by PA  Psych: pleasant but tearful      Assessment/Plan: 1. Functional deficits secondary to bilateral BKA which require 3+ hours per day of interdisciplinary therapy in a comprehensive inpatient rehab setting.  Physiatrist is providing close team supervision and 24 hour management of active medical problems listed below.  Physiatrist and rehab team continue to assess barriers to discharge/monitor patient progress toward functional and medical goals  Care Tool:  Bathing    Body parts bathed by patient: Right arm, Left arm, Chest, Abdomen, Front perineal area, Buttocks, Right upper leg, Left upper leg, Face   Body parts bathed by helper: Buttocks, Right upper leg, Left upper leg Body parts n/a: Left lower leg, Right lower leg   Bathing assist Assist Level: Minimal Assistance - Patient > 75%     Upper Body Dressing/Undressing Upper body dressing   What is the patient wearing?: Hospital gown only    Upper body assist Assist Level: Set up assist    Lower Body Dressing/Undressing Lower body dressing    Lower body dressing activity did not occur: Safety/medical concerns What is the patient wearing?: Incontinence brief     Lower body assist Assist for lower body dressing: Moderate Assistance - Patient 50 - 74%     Toileting Toileting Toileting Activity did not occur (Clothing management and hygiene only): N/A (no void or bm)  Toileting assist Assist for toileting: Minimal Assistance - Patient >  75%     Transfers Chair/bed transfer  Transfers assist     Chair/bed transfer assist level: Contact Guard/Touching assist     Locomotion Ambulation   Ambulation assist   Ambulation activity did not occur: Safety/medical concerns(new L BKA and new R prosthesis, unable to come to a full stance due to decreased strength/balance)          Walk 10 feet activity   Assist  Walk 10  feet activity did not occur: Safety/medical concerns(new L BKA and new R prosthesis, unable to come to a full stance due to decreased strength/balance)        Walk 50 feet activity   Assist Walk 50 feet with 2 turns activity did not occur: Safety/medical concerns(new L BKA and new R prosthesis, unable to come to a full stance due to decreased strength/balance)         Walk 150 feet activity   Assist Walk 150 feet activity did not occur: Safety/medical concerns(new L BKA and new R prosthesis, unable to come to a full stand due to decreased strength/balance)         Walk 10 feet on uneven surface  activity   Assist Walk 10 feet on uneven surfaces activity did not occur: Safety/medical concerns(new L BKA and new R prosthesis, unable to come to a full stand due to decreased strength/balance)         Wheelchair     Assist Will patient use wheelchair at discharge?: Yes Type of Wheelchair: Manual    Wheelchair assist level: Set up assist Max wheelchair distance: 155'    Wheelchair 50 feet with 2 turns activity    Assist        Assist Level: Set up assist   Wheelchair 150 feet activity     Assist      Assist Level: Set up assist   Blood pressure (!) 156/87, pulse 91, temperature 98.8 F (37.1 C), temperature source Oral, resp. rate 17, height 5\' 2"  (1.575 m), weight 64.1 kg, last menstrual period 02/12/2011, SpO2 99 %.  Medical Problem List and Plan: 1.  Decreased functional mobility secondary to left transtibial amputation 09/21/2019 with wound VAC as directed as well as history of right BKA 12/31/2018.  Patient does have a right prosthesis.             -patient may shower but leg must be covered.              -ELOS/Goals: 10-14 days, modI in PT, OT, and I in SLP  1/29- Vascular wants pt on Trental after rehab d/c.- starting NTG patch for increased blood flow and started Keflex 250 mg q6 hours for erythema of BKA.  2.  Antithrombotics:  -DVT/anticoagulation: Patient has bilateral BKA             -antiplatelet therapy: Aspirin 81 mg daily 3. Pain Management: Oxycodone and Robaxin as needed  1/23 fair control. Discussed massage and sensory feedback today 4. Mood: Provide emotional support             -antipsychotic agents: N/A 5. Neuropsych: This patient is capable of making decisions on her own behalf. 6. Skin/Wound Care: Routine skin checks 7. Fluids/Electrolytes/Nutrition: Routine in and outs with follow-up chemistries 8.  CKD stage III.  Baseline creatinine 1.21-2.13.  Follow-up Cr 0.94 on 1/20 9.  CAD with CABG October 2019.  Continue aspirin therapy 10.  Acute on chronic diastolic congestive heart failure.  Monitor for any signs of fluid overload  Filed Weights   09/27/19 1753 10/05/19 0600 10/07/19 0435  Weight: 59.9 kg 60.1 kg 64.1 kg    1/26- ordered daily weights  1/27- weight stable.  11.  History of intracerebral hemorrhage February 2017.  Follow-up outpatient 12.  Diabetes mellitus with peripheral neuropathy.  Hemoglobin A1c 14.1.  SSI.  Patient on Lantus insulin 50 units twice daily prior to admission.    1/23 resume lantus at 5u bid  1/24 some improvement--monitor for pattern   CBG (last 3)  Recent Labs    10/06/19 1647 10/06/19 2106 10/07/19 0608  GLUCAP 158* 242* 197*    1/26- Will start Lantus 5 units QHS since not in computer. Based on BGs 133-355  1/28- bgS 145-218 13.  Hypertension.  Norvasc 5 mg daily. Systolics in last 2 days ranging from 141 to 171. Consider increasing Norvasc to 10mg  daily.   1/20: Will increase Norvasc to 10mg  for uncontrolled BP.  1/21: Continues to be elevated. Will add Hydralazine 10mg  daily.   1/22: BP and HR still slightly elevated. Will increase Hydralazine to 10mg  BID.   1/24 fair control, nausea may be increasing SBP sl  1/25- SBP 150s this AM- better overall- will monitor  1/28- BP 136/113-  14.  Hyperlipidemia.  Lipitor 15.  Constipation: Docusate 100mg   BID. Has not yet had BM. Consider changing to Senna-docusate BID.   1/20: Still constipated, will add daily Miralax  1/23: had a bm on 1/20, small   -still refusing meds   -continued education by staff, encourage use of softeners/laxative  1/24: discussed with pt today. She realizes she needs to get bowels going   -sorbitol today, SSE if needed  1/25- 3 BMs documented in last 24 hours- hasn't changed nausea in AM  1/28- 2 BMs 1/27 16. Itching:  hydroxyzine prn.  17. Check U/A and Cx per pt request- urine smell bad- possibly having some stinging with urination, sent UA and Cx  1/29- Nitrite (+)_ already placed on keflex 250 mg q6 hours by Vascular for BKA, so will wait for Cx to see if need additional ABX.    LOS: 10 days A FACE TO FACE EVALUATION WAS PERFORMED    10/07/2019, 1:02 PM

## 2019-10-07 NOTE — Progress Notes (Signed)
The patient is a little over 2 weeks s/p Left Below Knee amputation. Rehab was concerned about some wound necrosis.  Left BKA: Shrinker in place with bulky dressing beneath. Dressing removed. Wound is well approximated however there are areas of superficial wound necrosis. Mild erythema. Staples in place. Swelling is controlled . Minimal drainage , no foul odor,no fluctuance.   Patient is complaining of right knee pain over top of knee and does not think she can do rehab today. She is 9 months s/p Right BKA. She denies any trauma but she did sleep with her prosthetic on.  Right Knee: No effusion, cellulitis or open wounds. She does have some mild tendernes over the superior pole of the patella.   A/P s/p Left Bka with some superficial wound necrosis. Will begin nitro patch to improve blood flow. If patient not on a blood thinner will Order Trental. Also Shrinker is medicated and should be placed next to skin. If  Additional dressing needed should be placed over shrinker. Right knee pain: no trauma and no acute process. Pain most likely caused by sleep with socket in place

## 2019-10-07 NOTE — Progress Notes (Signed)
Occupational Therapy Session Note  Patient Details  Name: [name] MRN: [id number] Date of Birth: Jun 17, 1966  Today's Date: 10/07/2019 OT Individual Time: [id number] OT Individual Time Calculation (min): 28 min    Short Term Goals: Week 1:  OT Short Term Goal 1 (Week 1): na STG = LTG  Skilled Therapeutic Interventions/Progress Updates:    Patient seated in w/c, alert, states that she is having pain in right knee - offered ice pack which she declined, repositioned on pillow with some relief.  Patient offered adl / bathing, hair washing, transfer to bed or other surface for practice all of which she declined at this time stating that she is not getting out of the w/c - reviewed importance of change of position, skin care for buttocks and distal limbs, off loading and elevation options for bilateral LEs but she continues to refuse to get out of w/c.  She agreed to w/c propulsion on unit to get out of room and "exercise a little bit"  She is able to propel 300+ feet at a slow rate.  She is able to change position/weight shift off of buttocks in w/c.  She changed her shirt with set up but continues to decline bathing.  She remained in the w/c refusing to complete additional therapeutic activities.  A second attempt was made at 1130 to make up missed time - she is still in w/c at this time and refuses to change position.    Therapy Documentation Precautions:  Precautions Precautions: Fall Precaution Comments: wound vac Required Braces or Orthoses: (residual limb protector for LLE) Restrictions Weight Bearing Restrictions: No LLE Weight Bearing: Non weight bearing Other Position/Activity Restrictions: limb guard LLE General: General OT Amount of Missed Time: 32 Minutes Vital Signs:  Pain: Pain Assessment Pain Scale: 0-10 Pain Score: 5  Pain Type: Acute pain Pain Location: Knee Pain Orientation: Right Pain Descriptors / Indicators: Tender Pain Intervention(s): Repositioned    Other Treatments:     Therapy/Group: Individual Therapy  Carlos Levering 10/07/2019, 11:43 AM

## 2019-10-07 NOTE — Progress Notes (Signed)
Physical Therapy Session Note  Patient Details  Name: [name] MRN: [id number] Date of Birth: 1965/12/29  Today's Date: 10/07/2019 PT Individual Time: 1301-1401 PT Individual Time Calculation (min): 60 min   Short Term Goals: Week 2:  PT Short Term Goal 1 (Week 2): STG=LTG due to short ELOS.  Skilled Therapeutic Interventions/Progress Updates:      Pt presents sitting in w/c, agreeable to therapy, but refuses transfers.  Removed shrinker to right residual limb w/ slight redness to medial patella d/t leaving prosthesis on overnight.  Re-applied left residual limb protector w/ min A.  Pt negotiated w/c in hallways up to 170' including turn in hallway.  Pt performed seated overhead press, chest press  3 x 10 w/ 2# weighted ball, unilateral horizontal abduction w/ purple T-band w/o trunk support.  Pt performed seated shuttle ball w/o trunk support w/ cues for UE abduction.  Pt returned to room w/ w/c mobility and all needs within reach.  Therapy Documentation Precautions:  Precautions Precautions: Fall Precaution Comments: wound vac Required Braces or Orthoses: (residual limb protector for LLE) Restrictions Weight Bearing Restrictions: No LLE Weight Bearing: Non weight bearing Other Position/Activity Restrictions: limb guard LLE General:   Vital Signs:  Pain: 0/10, although occasiuonal phantom pains.to left residual limb. Pain Assessment Pain Scale: 0-10 Pain Score: 5  Pain Type: Acute pain Pain Location: Knee Pain Orientation: Right Pain Descriptors / Indicators: Tender Pain Intervention(s): Repositioned       Therapy/Group: Individual Therapy  Ladoris Gene 10/07/2019, 2:03 PM

## 2019-10-07 NOTE — Progress Notes (Signed)
Physical Therapy Session Note  Patient Details  Name: [name] MRN: [id number] Date of Birth: 05-15-1966  Today's Date: 10/07/2019 PT Individual Time: 1301-1401 PT Individual Time Calculation (min): 60 min   Short Term Goals: Week 1:  PT Short Term Goal 1 (Week 1): STG=LTG due to short ELOS  Skilled Therapeutic Interventions/Progress Updates:   Pt received sitting in WC and agreeable to PT. WC mobility training with supervision assist x 320f, 2084fand 15064fues for safety in tight space of crowded room. PT encouraged pt to attempt SB transfer to mat table or car, but pt decline adamantly. PT instructed pt in sitting balance in WC while engaged in wii sports bowling and boxing, no UE support or back support; pt sitting at [place] seat throughout. No LOB noted. LE therex, LAQ, hip abduction, reciprocal marches, AROM flexion. Each completed 2 x 12 BLE with cues for decreased compensations as well as full ROM. Pt reporting extreme fatigue and stating that she needs to go back to her room to rest. Patient returned to room and left sitting [facility] reach and all needs met.         Therapy Documentation Precautions:  Precautions Precautions: Fall Precaution Comments: wound vac Required Braces or Orthoses: (residual limb protector for LLE) Restrictions Weight Bearing Restrictions: No LLE Weight Bearing: Non weight bearing Other Position/Activity Restrictions: limb guard LLE    Vital Signs: Therapy Vitals Temp: 98.4 F (36.9 C) Temp Source: Oral Pulse Rate: 91 Resp: 14 BP: 124/78 Patient Position (if appropriate): Sitting Oxygen Therapy SpO2: 99 % O2 Device: Room Air Pain: 5/10 LLE. Phantom Pain. Pt repositioned, RN aware.    Therapy/Group: Individual Therapy  AusLorie Phenix29/2021, 5:31 PM

## 2019-10-08 LAB — URINE CULTURE: Culture: >=100000 — AB

## 2019-10-08 LAB — GLUCOSE, CAPILLARY
Glucose-Capillary: 177 mg/dL — ABNORMAL HIGH (ref 70–99)
Glucose-Capillary: 147 mg/dL — ABNORMAL HIGH (ref 70–99)
Glucose-Capillary: 180 mg/dL — ABNORMAL HIGH (ref 70–99)
Glucose-Capillary: 247 mg/dL — ABNORMAL HIGH (ref 70–99)

## 2019-10-08 MED ORDER — SULFAMETHOXAZOLE-TRIMETHOPRIM 800-160 MG PO TABS
1.0000 | ORAL_TABLET | Freq: Two times a day (BID) | ORAL | Status: DC
  Administered 2019-10-08 – 2019-10-12 (×9): 1 via ORAL
  Filled 2019-10-08 (×10): qty 1

## 2019-10-08 NOTE — Progress Notes (Signed)
[facility] PHYSICAL MEDICINE & REHABILITATION PROGRESS NOTE   Subjective/Complaints: Denies pain this morning. Having regular BM. Sleeping well at night.  ROS: Patient denies fever, rash, sore throat, blurred vision,  diarrhea, cough, shortness of breath or chest pain,  back pain, headache, or mood change.   Objective:  No results found. No results for input(s): WBC, HGB, HCT, PLT in the last 72 hours. No results for input(s): NA, K, CL, CO2, GLUCOSE, BUN, CREATININE, CALCIUM in the last 72 hours.  Intake/Output Summary (Last 24 hours) at 10/08/2019 1030 Last data filed at 10/08/2019 0853 Gross per 24 hour  Intake 780 ml  Output --  Net 780 ml     Physical Exam: Vital Signs Blood pressure (!) 165/87, pulse (!) 102, temperature 98.2 F (36.8 C), resp. rate 18, height 5\' 2"  (1.575 m), weight 59.7 kg, last menstrual period 02/12/2011, SpO2 100 %. Constitutional: Vital signs reviewed. Sleeping in her chair, easily arousable.  tearful due to scared of surgery NAD HEENT: EOMI, oral membranes moist Neck: supple Cardiovascular: RRR without murmur. No JVD    Respiratory: CTA Bilaterally without wheezes or rales. Normal effort    GI: BS +, non-tender, non-distended, soft  Abdomen: Soft, non-tender, non-distended, bowel sounds positive. Extremities: No clubbing, cyanosis, or edema. Pulses are 2+ Neurological: Patient is alert no acute distress.  Oriented x3 and follows commands.  Skin: .  Right BKA is well-healed, moving well.  LLE has stump shrinker sock in place- taken off by PA  Psych: pleasant but tearful      Assessment/Plan: 1. Functional deficits secondary to bilateral BKA which require 3+ hours per day of interdisciplinary therapy in a comprehensive inpatient rehab setting.  Physiatrist is providing close team supervision and 24 hour management of active medical problems listed below.  Physiatrist and rehab team continue to assess barriers to discharge/monitor patient  progress toward functional and medical goals  Care Tool:  Bathing    Body parts bathed by patient: Right arm, Left arm, Chest, Abdomen, Front perineal area, Buttocks, Right upper leg, Left upper leg, Face   Body parts bathed by helper: Buttocks, Right upper leg, Left upper leg Body parts n/a: Left lower leg, Right lower leg   Bathing assist Assist Level: Minimal Assistance - Patient > 75%     Upper Body Dressing/Undressing Upper body dressing   What is the patient wearing?: Hospital gown only    Upper body assist Assist Level: Set up assist    Lower Body Dressing/Undressing Lower body dressing    Lower body dressing activity did not occur: Safety/medical concerns What is the patient wearing?: Incontinence brief     Lower body assist Assist for lower body dressing: Moderate Assistance - Patient 50 - 74%     Toileting Toileting Toileting Activity did not occur Landscape architect and hygiene only): N/A (no void or bm)  Toileting assist Assist for toileting: Minimal Assistance - Patient > 75%     Transfers Chair/bed transfer  Transfers assist     Chair/bed transfer assist level: Contact Guard/Touching assist     Locomotion Ambulation   Ambulation assist   Ambulation activity did not occur: Safety/medical concerns(new L BKA and new R prosthesis, unable to come to a full stance due to decreased strength/balance)          Walk 10 feet activity   Assist  Walk 10 feet activity did not occur: Safety/medical concerns(new L BKA and new R prosthesis, unable to come to a full stance due to  decreased strength/balance)        Walk 50 feet activity   Assist Walk 50 feet with 2 turns activity did not occur: Safety/medical concerns(new L BKA and new R prosthesis, unable to come to a full stance due to decreased strength/balance)         Walk 150 feet activity   Assist Walk 150 feet activity did not occur: Safety/medical concerns(new L BKA and new R  prosthesis, unable to come to a full stand due to decreased strength/balance)         Walk 10 feet on uneven surface  activity   Assist Walk 10 feet on uneven surfaces activity did not occur: Safety/medical concerns(new L BKA and new R prosthesis, unable to come to a full stand due to decreased strength/balance)         Wheelchair     Assist Will patient use wheelchair at discharge?: Yes Type of Wheelchair: Manual    Wheelchair assist level: Set up assist Max wheelchair distance: 170'    Wheelchair 50 feet with 2 turns activity    Assist        Assist Level: Set up assist   Wheelchair 150 feet activity     Assist      Assist Level: Set up assist   Blood pressure (!) 165/87, pulse (!) 102, temperature 98.2 F (36.8 C), resp. rate 18, height 5\' 2"  (1.575 m), weight 59.7 kg, last menstrual period 02/12/2011, SpO2 100 %.  Medical Problem List and Plan: 1.  Decreased functional mobility secondary to left transtibial amputation 09/21/2019 with wound VAC as directed as well as history of right BKA 12/31/2018.  Patient does have a right prosthesis.             -patient may shower but leg must be covered.              -ELOS/Goals: 10-14 days, modI in PT, OT, and I in SLP  1/29- Vascular wants pt on Trental after rehab d/c.- starting NTG patch for increased blood flow and started Keflex 250 mg q6 hours for erythema of BKA.  2.  Antithrombotics: -DVT/anticoagulation: Patient has bilateral BKA             -antiplatelet therapy: Aspirin 81 mg daily 3. Pain Management: Oxycodone and Robaxin as needed  1/23 fair control. Discussed massage and sensory feedback today 4. Mood: Provide emotional support             -antipsychotic agents: N/A 5. Neuropsych: This patient is capable of making decisions on her own behalf. 6. Skin/Wound Care: Routine skin checks 7. Fluids/Electrolytes/Nutrition: Routine in and outs with follow-up chemistries 8.  CKD stage III.  Baseline  creatinine 1.21-2.13.  Follow-up Cr 0.94 on 1/20 9.  CAD with CABG October 2019.  Continue aspirin therapy 10.  Acute on chronic diastolic congestive heart failure.  Monitor for any signs of fluid overload   Filed Weights   10/05/19 0600 10/07/19 0435 10/08/19 0517  Weight: 60.1 kg 64.1 kg 59.7 kg    1/26- ordered daily weights  1/27- weight stable.  11.  History of intracerebral hemorrhage February 2017.  Follow-up outpatient 12.  Diabetes mellitus with peripheral neuropathy.  Hemoglobin A1c 14.1.  SSI.  Patient on Lantus insulin 50 units twice daily prior to admission.    1/23 resume lantus at 5u bid  1/24 some improvement--monitor for pattern   CBG (last 3)  Recent Labs    10/07/19 1746 10/07/19 2103 10/08/19 [id number]  GLUCAP  177* 272* 177*    1/26- Will start Lantus 5 units QHS since not in computer. Based on BGs 133-355  1/28- bgS 145-218 13.  Hypertension.  Norvasc 5 mg daily. Systolics in last 2 days ranging from 141 to 171. Consider increasing Norvasc to 10mg  daily.   1/20: Will increase Norvasc to 10mg  for uncontrolled BP.  1/21: Continues to be elevated. Will add Hydralazine 10mg  daily.   1/22: BP and HR still slightly elevated. Will increase Hydralazine to 10mg  BID.   1/24 fair control, nausea may be increasing SBP sl  1/25- SBP 150s this AM- better overall- will monitor  1/28- BP 136/113  1/30: BP is uncontrolled this morning to [id number] systolic. It appears to have worse control in the mornings and better control at nights. Will maintain current dose of BP medications and I do not want her to become hypotensive in afternoons/evenings.  14.  Hyperlipidemia.  Lipitor 15.  Constipation: Docusate 100mg  BID. Has not yet had BM. Consider changing to Senna-docusate BID.   1/20: Still constipated, will add daily Miralax  1/23: had a bm on 1/20, small   -still refusing meds   -continued education by staff, encourage use of softeners/laxative  1/24: discussed with pt today. She  realizes she needs to get bowels going   -sorbitol today, SSE if needed  1/25- 3 BMs documented in last 24 hours- hasn't changed nausea in AM  1/28- 2 BMs 1/27 16. Itching:  hydroxyzine prn.  17. Check U/A and Cx per pt request- urine smell bad- possibly having some stinging with urination, sent UA and Cx  1/29- Nitrite (+)_ already placed on keflex 250 mg q6 hours by Vascular for BKA, so will wait for Cx to see if need additional ABX.   1/30: Communicated with pharmacy this morning regarding UC growing Citrobacter Freundii .Started Bactrim DS BID for 7 days as organism is resistant to Keflex.    LOS: 11 days A FACE TO FACE EVALUATION WAS PERFORMED  Edelmiro Innocent P Jailey Booton 10/08/2019, 10:30 AM

## 2019-10-09 ENCOUNTER — Encounter (HOSPITAL_COMMUNITY): Payer: BC Managed Care – PPO | Admitting: Occupational Therapy

## 2019-10-09 ENCOUNTER — Ambulatory Visit (HOSPITAL_COMMUNITY): Payer: BC Managed Care – PPO

## 2019-10-09 LAB — GLUCOSE, CAPILLARY
Glucose-Capillary: 193 mg/dL — ABNORMAL HIGH (ref 70–99)
Glucose-Capillary: 167 mg/dL — ABNORMAL HIGH (ref 70–99)
Glucose-Capillary: 225 mg/dL — ABNORMAL HIGH (ref 70–99)
Glucose-Capillary: 196 mg/dL — ABNORMAL HIGH (ref 70–99)

## 2019-10-09 MED ORDER — INSULIN GLARGINE 100 UNIT/ML ~~LOC~~ SOLN
6.0000 [IU] | Freq: Every day | SUBCUTANEOUS | Status: DC
  Administered 2019-10-09: 22:00:00 6 [IU] via SUBCUTANEOUS
  Filled 2019-10-09 (×2): qty 0.06

## 2019-10-09 NOTE — Progress Notes (Signed)
Physical Therapy Session Note  Patient Details  Name: [name] MRN: [id number] Date of Birth: 11-29-1965  Today's Date: 10/09/2019 PT Individual Time: 1420-1505 PT Individual Time Calculation (min): 45 min   Short Term Goals: Week 2:  PT Short Term Goal 1 (Week 2): STG=LTG due to short ELOS.  Skilled Therapeutic Interventions/Progress Updates:     Patient in w/c in room upon PT arrival. Patient alert and agreeable to PT session. Patient denied pain during session. Patient's son was unable to come to participate in family education today. She stated that he would be available on Tuesday to participate in family education, however, when she spoke with her son on the phone during this session he was unable to provide a time he would be available to come in. Educated on the importance of family education due to new amputation and use of new prosthesis with mobility. Patient and her son stated understanding.   Patient had questions about her L residual limb wound healing. Asked when her shrinker was last changed, she stated she did not remember, but through it was Friday. PT doffed L limb guard and shrinker with total A, patient with minimal dried drainage attaching to patient's skin causing moderate pain with removal. RN called in to look at would, RN and PT noted decreased erythema since last seen on Thursday. Educated the patient on placing the shrinker directly on the wound and changing and rinsing the shrinker daily. PT rinsed shrinker and hung it to dry in the bathroom and patient donned a new shrinker with mod-max A.   Patient reported increased nausea today and requested to not perform any transfers. PT educated upcoming d/c date and importance of practice for safety with mobility at home. Patient stated understanding, however, continued to decline performing any transfers today. Discussed different transfers patient needed and purpose of each for increased independence on participation  with activities with her family.   Therapeutic Exercise: Patient performed the following exercises with verbal and tactile cues for proper technique. B LAQ 2x10 Seated marching 2x20 Lateral leans in sitting for pressure relief 2x5 (educated on performing every 30 min in sitting, patient reported that she does these often)  Patient in w/c in room with L limb guard and R prosthesis donned at end of session with breaks locked and all needs within reach. Discussed wearing schedule for R prosthesis, working up to 12 hours per day. Patient reported that she is currently wearing it for at least 6 hours a day. Educated on benefits of wearing the prosthesis to build pressure tolerance for higher level mobility when she has B prosthesis. Patient stated understanding. She also reports that she has not slept in her prosthesis since she was re-educated last week after sleeping in it overnight.    Therapy Documentation Precautions:  Precautions Precautions: Fall Precaution Comments: wound vac Required Braces or Orthoses: (residual limb protector for LLE) Restrictions Weight Bearing Restrictions: No LLE Weight Bearing: Non weight bearing Other Position/Activity Restrictions: limb guard LLE General: PT Amount of Missed Time (min): 15 Minutes PT Missed Treatment Reason: Patient ill (Comment)(Nausea)    Therapy/Group: Individual Therapy  Sriram Febles L Rosely Fernandez PT, DPT  10/09/2019, 4:19 PM

## 2019-10-09 NOTE — Progress Notes (Signed)
[place] PHYSICAL MEDICINE & REHABILITATION PROGRESS NOTE   Subjective/Complaints: Denies pain this morning. Having regular BM. Sleeping well at night. Continues sleeping in chair because of her diarrhea, this helps her to get to the bathroom on time.   ROS: Patient denies fever, rash, sore throat, blurred vision,  diarrhea, cough, shortness of breath or chest pain,  back pain, headache, or mood change.   Objective:  No results found. No results for input(s): WBC, HGB, HCT, PLT in the last 72 hours. No results for input(s): NA, K, CL, CO2, GLUCOSE, BUN, CREATININE, CALCIUM in the last 72 hours.  Intake/Output Summary (Last 24 hours) at 10/09/2019 1125 Last data filed at 10/09/2019 0745 Gross per 24 hour  Intake 400 ml  Output --  Net 400 ml     Physical Exam: Vital Signs Blood pressure (!) 153/90, pulse 100, temperature 98.3 F (36.8 C), resp. rate 16, height 5\' 2"  (1.575 m), weight 58.1 kg, last menstrual period 02/12/2011, SpO2 100 %. Constitutional: Vital signs reviewed. Sleeping in her chair, easily arousable. HEENT: EOMI, oral membranes moist Neck: supple Cardiovascular: RRR without murmur. No JVD    Respiratory: CTA Bilaterally without wheezes or rales. Normal effort    GI: BS +, non-tender, non-distended, soft. Appears a little nauseous.  Abdomen: Soft, non-tender, non-distended, bowel sounds positive. Extremities: No clubbing, cyanosis, or edema. Pulses are 2+ Neurological: Patient is alert no acute distress.  Oriented x3 and follows commands.  Skin: .  Right BKA is well-healed, moving well.  LLE has stump shrinker sock in place- taken off by PA  Psych: pleasant       Assessment/Plan: 1. Functional deficits secondary to bilateral BKA which require 3+ hours per day of interdisciplinary therapy in a comprehensive inpatient rehab setting.  Physiatrist is providing close team supervision and 24 hour management of active medical problems listed below.  Physiatrist  and rehab team continue to assess barriers to discharge/monitor patient progress toward functional and medical goals  Care Tool:  Bathing    Body parts bathed by patient: Right arm, Left arm, Chest, Abdomen, Front perineal area, Buttocks, Right upper leg, Left upper leg, Face   Body parts bathed by helper: Buttocks, Right upper leg, Left upper leg Body parts n/a: Left lower leg, Right lower leg   Bathing assist Assist Level: Minimal Assistance - Patient > 75%     Upper Body Dressing/Undressing Upper body dressing   What is the patient wearing?: Hospital gown only    Upper body assist Assist Level: Set up assist    Lower Body Dressing/Undressing Lower body dressing    Lower body dressing activity did not occur: Safety/medical concerns What is the patient wearing?: Incontinence brief     Lower body assist Assist for lower body dressing: Moderate Assistance - Patient 50 - 74%     Toileting Toileting Toileting Activity did not occur Landscape architect and hygiene only): N/A (no void or bm)  Toileting assist Assist for toileting: Minimal Assistance - Patient > 75%     Transfers Chair/bed transfer  Transfers assist     Chair/bed transfer assist level: Contact Guard/Touching assist     Locomotion Ambulation   Ambulation assist   Ambulation activity did not occur: Safety/medical concerns(new L BKA and new R prosthesis, unable to come to a full stance due to decreased strength/balance)          Walk 10 feet activity   Assist  Walk 10 feet activity did not occur: Safety/medical concerns(new L BKA  and new R prosthesis, unable to come to a full stance due to decreased strength/balance)        Walk 50 feet activity   Assist Walk 50 feet with 2 turns activity did not occur: Safety/medical concerns(new L BKA and new R prosthesis, unable to come to a full stance due to decreased strength/balance)         Walk 150 feet activity   Assist Walk 150 feet  activity did not occur: Safety/medical concerns(new L BKA and new R prosthesis, unable to come to a full stand due to decreased strength/balance)         Walk 10 feet on uneven surface  activity   Assist Walk 10 feet on uneven surfaces activity did not occur: Safety/medical concerns(new L BKA and new R prosthesis, unable to come to a full stand due to decreased strength/balance)         Wheelchair     Assist Will patient use wheelchair at discharge?: Yes Type of Wheelchair: Manual    Wheelchair assist level: Set up assist Max wheelchair distance: 170'    Wheelchair 50 feet with 2 turns activity    Assist        Assist Level: Set up assist   Wheelchair 150 feet activity     Assist      Assist Level: Set up assist   Blood pressure (!) 153/90, pulse 100, temperature 98.3 F (36.8 C), resp. rate 16, height 5\' 2"  (1.575 m), weight 58.1 kg, last menstrual period 02/12/2011, SpO2 100 %.  Medical Problem List and Plan: 1.  Decreased functional mobility secondary to left transtibial amputation 09/21/2019 with wound VAC as directed as well as history of right BKA 12/31/2018.  Patient does have a right prosthesis.             -patient may shower but leg must be covered.              -ELOS/Goals: 10-14 days, modI in PT, OT, and I in SLP  1/29- Vascular wants pt on Trental after rehab d/c.- starting NTG patch for increased blood flow and started Keflex 250 mg q6 hours for erythema of BKA.  2.  Antithrombotics: -DVT/anticoagulation: Patient has bilateral BKA             -antiplatelet therapy: Aspirin 81 mg daily 3. Pain Management: Oxycodone and Robaxin as needed  1/23 fair control. Discussed massage and sensory feedback today 4. Mood: Provide emotional support             -antipsychotic agents: N/A 5. Neuropsych: This patient is capable of making decisions on her own behalf. 6. Skin/Wound Care: Routine skin checks 7. Fluids/Electrolytes/Nutrition: Routine in and  outs with follow-up chemistries 8.  CKD stage III.  Baseline creatinine 1.21-2.13.  Follow-up Cr 0.94 on 1/20 9.  CAD with CABG October 2019.  Continue aspirin therapy 10.  Acute on chronic diastolic congestive heart failure.  Monitor for any signs of fluid overload   Filed Weights   10/07/19 0435 10/08/19 0517 10/09/19 0500  Weight: 64.1 kg 59.7 kg 58.1 kg    1/26- ordered daily weights  1/27- weight stable.   1/31: stable 11.  History of intracerebral hemorrhage February 2017.  Follow-up outpatient 12.  Diabetes mellitus with peripheral neuropathy.  Hemoglobin A1c 14.1.  SSI.  Patient on Lantus insulin 50 units twice daily prior to admission.    1/23 resume lantus at 5u bid  1/24 some improvement--monitor for pattern   CBG (last  3)  Recent Labs    10/08/19 1652 10/08/19 2102 10/09/19 0612  GLUCAP 180* 247* 193*    1/26- Will start Lantus 5 units QHS since not in computer. Based on BGs 133-355  1/28- bgS 145-218  1/31: elevated range from 177-247 last 5 reads. Will increase Lantus to 6U.  13.  Hypertension.  Norvasc 5 mg daily. Systolics in last 2 days ranging from 141 to 171. Consider increasing Norvasc to 10mg  daily.   1/20: Will increase Norvasc to 10mg  for uncontrolled BP.  1/21: Continues to be elevated. Will add Hydralazine 10mg  daily.   1/22: BP and HR still slightly elevated. Will increase Hydralazine to 10mg  BID.   1/24 fair control, nausea may be increasing SBP sl  1/25- SBP 150s this AM- better overall- will monitor  1/28- BP 136/113  1/30: BP is uncontrolled this morning to 123456 systolic. It appears to have worse control in the mornings and better control at nights. Will maintain current dose of BP medications and I do not want her to become hypotensive in afternoons/evenings.  1/31: continues to be labile  14.  Hyperlipidemia.  Lipitor 15.  Constipation: Docusate 100mg  BID. Has not yet had BM. Consider changing to Senna-docusate BID.   1/20: Still constipated,  will add daily Miralax  1/23: had a bm on 1/20, small   -still refusing meds   -continued education by staff, encourage use of softeners/laxative  1/24: discussed with pt today. She realizes she needs to get bowels going   -sorbitol today, SSE if needed  1/25- 3 BMs documented in last 24 hours- hasn't changed nausea in AM  1/28- 2 BMs 1/27 16. Itching:  hydroxyzine prn.  17. Check U/A and Cx per pt request- urine smell bad- possibly having some stinging with urination, sent UA and Cx  1/29- Nitrite (+)_ already placed on keflex 250 mg q6 hours by Vascular for BKA, so will wait for Cx to see if need additional ABX.   1/30: Communicated with pharmacy this morning regarding UC growing Citrobacter Freundii .Started Bactrim DS BID for 7 days as organism is resistant to Keflex.    LOS: 12 days A FACE TO FACE EVALUATION WAS PERFORMED  Martha Clan P Kacper Cartlidge 10/09/2019, 11:25 AM

## 2019-10-09 NOTE — Progress Notes (Signed)
Occupational Therapy Session Note  Patient Details  Name: [name] MRN: [id number] Date of Birth: 11-26-1965  Today's Date: 10/09/2019 OT Individual Time: 1300-1356 OT Individual Time Calculation (min): 56 min   Short Term Goals: Week 1:  OT Short Term Goal 1 (Week 1): na STG = LTG     Skilled Therapeutic Interventions/Progress Updates:    Pt greeted in w/c with no c/o pain. Started session by calling her son who was scheduled to be here for family training. Per son, he has family obligations and is unable to attend OT and PT family education sessions today. Discussed with pt her plan for BADL routine at home. She stated that her son has installed a spin-around toilet seat so pt can access toilet easier via w/c. Discussed method of AP transfer for toileting with pt preferring to pivot on her leg at this time. Per pt, she will have assist with toilet transfers, toileting, and sponge bathing from her son's girlfriend. OT encouraged pt to participate in ADL tasks during session however pt refused, wanting to leave room for tx. She stated that she "feels depressed" and that staying in her room can be very confining. Therefore we worked on [employer] and endurance while pt self propelled in a community hospital setting. Pt navigating around natural obstacles and environment and maneuvering in tight spaces. Discussed daily pressure relief at home and pt completed w/c push ups near a window where she could look outside at the flowers. She reported growing flowers at home. We discussed option of raised beds or having flower pots in the home that she can tend to. Also discussed using online resources for leisure pursuits to address her psychosocial health. Pt expressed interest in crocheting. Strongly encouraged her to to safely engage in the community with family assistance, as she likes to shop and be around people. Encouraged pt to attempt slideboard transfers in community today however she  adamantly refused. When back on the unit, pt self propelled to the family room for some coffee. Per pt "I know they were all looking at me, because I have no legs." Provided her with emotional support at this time, education provided regarding amputee support group. Pt expressed gratefulness for education. After returning to room pt was left in w/c with all needs within reach.   Provided pt with an antinausea aromatherapy blend to address nausea during session  Therapy Documentation Precautions:  Precautions Precautions: Fall Precaution Comments: wound vac Required Braces or Orthoses: (residual limb protector for LLE) Restrictions Weight Bearing Restrictions: No LLE Weight Bearing: Non weight bearing Other Position/Activity Restrictions: limb guard LLE ADL: ADL Eating: Independent Where Assessed-Eating: Wheelchair Grooming: Setup Where Assessed-Grooming: Sitting at sink, Wheelchair Upper Body Bathing: Setup Where Assessed-Upper Body Bathing: Wheelchair, Sitting at sink Lower Body Bathing: Maximal assistance Where Assessed-Lower Body Bathing: Sitting at sink, Wheelchair Upper Body Dressing: Supervision/safety, Setup Where Assessed-Upper Body Dressing: Wheelchair ADL Comments: patient declined LB dressing due to bowel issues today      Therapy/Group: Individual Therapy  Oneal Biglow A Loghan Subia 10/09/2019, 3:49 PM

## 2019-10-10 ENCOUNTER — Inpatient Hospital Stay (HOSPITAL_COMMUNITY): Payer: BC Managed Care – PPO

## 2019-10-10 ENCOUNTER — Telehealth: Payer: Self-pay | Admitting: Radiology

## 2019-10-10 ENCOUNTER — Inpatient Hospital Stay (HOSPITAL_COMMUNITY): Payer: BC Managed Care – PPO | Admitting: Occupational Therapy

## 2019-10-10 LAB — GLUCOSE, CAPILLARY
Glucose-Capillary: 152 mg/dL — ABNORMAL HIGH (ref 70–99)
Glucose-Capillary: 151 mg/dL — ABNORMAL HIGH (ref 70–99)
Glucose-Capillary: 127 mg/dL — ABNORMAL HIGH (ref 70–99)
Glucose-Capillary: 237 mg/dL — ABNORMAL HIGH (ref 70–99)

## 2019-10-10 MED ORDER — INSULIN GLARGINE 100 UNIT/ML ~~LOC~~ SOLN
7.0000 [IU] | Freq: Every day | SUBCUTANEOUS | Status: DC
  Administered 2019-10-10 – 2019-10-11 (×2): 7 [IU] via SUBCUTANEOUS
  Filled 2019-10-10 (×3): qty 0.07

## 2019-10-10 NOTE — Telephone Encounter (Signed)
FYI

## 2019-10-10 NOTE — Progress Notes (Signed)
Patient vomited and complains of nausea. VS WNL. PA notified. Discussed that medication for UTI side effect is nausea and it will take a few days for medication to adjust to body. Will continue zofran PRN q 6hours as needed. Continue plan of care. No further complications noted at this time.  Audie Clear, LPN

## 2019-10-10 NOTE — Progress Notes (Signed)
Occupational Therapy Session Note  Patient Details  Name: [name] MRN: [id number] Date of Birth: 05/15/1966  Today's Date: 10/10/2019 OT Individual Time: [id number] OT Individual Time Calculation (min): 38 min    Short Term Goals: Week 1:  OT Short Term Goal 1 (Week 1): na STG = LTG  Skilled Therapeutic Interventions/Progress Updates:    Patient seated in w/c, alert but states that she feels nauseous.  Offered change of position, something to drink, adl tasks/hair washing, return to bed, exercise, w/c mobility but patient declined.  Able to connect with son via patient's phone to verbally review patients current status with adl completion, functional transfer, DME (use of drop arm commode), skin safety and transfers with and without right prosthesis.  Discussed patients potential for return to function and the therapeutic activities/education and practice that she has declined to engage in to date.  Recommended bed level LB adl to ensure safety and that family review bathing in shower with home therapist.  Son states that he does not have any questions at this time but will reach out as they arise.  Encouraged him to participate in home therapy sessions to review functional task completion.  Patient remained seated in w/c, she did not engage in conversation with son but states that she was listening.  Call bell and tray table in reach.    Therapy Documentation Precautions:  Precautions Precautions: Fall Precaution Comments: wound vac Required Braces or Orthoses: (residual limb protector for LLE) Restrictions Weight Bearing Restrictions: Yes LLE Weight Bearing: Non weight bearing Other Position/Activity Restrictions: limb guard LLE General: General OT Amount of Missed Time: 22 Minutes Vital Signs: Therapy Vitals Temp: 97.8 F (36.6 C) Pulse Rate: 82 Resp: 18 BP: 125/75 Patient Position (if appropriate): Sitting Oxygen Therapy SpO2: 100 % O2 Device: Room Air Pain: Pain  Assessment Pain Scale: 0-10 Pain Score: 0-No pain   Therapy/Group: Individual Therapy  Carlos Levering 10/10/2019, 1:51 PM

## 2019-10-10 NOTE — Telephone Encounter (Signed)
Dan a Rehab PA from the hospital called and wanted to let Dr. [name] Given and Bevely Palmer know that [name] was being discharged home tomorrow, so if they wanted to come she her today and recheck her they could.  She is in room 4M13.

## 2019-10-10 NOTE — Progress Notes (Signed)
[place] PHYSICAL MEDICINE & REHABILITATION PROGRESS NOTE   Subjective/Complaints:  Pt reports she had some diarrhea- but only one time.   Still on ABX for UTI- drinking a lot of water.  Not sure how leg is looking- has on limb guard currently-  Family would rather have pt go home on Wednesday even though insurance wants her to d/c Tuesday.  Will recheck labs in AM to make sure doing well on UTI as well as BKA.    ROS: Patient denies fever, rash, sore throat, blurred vision, cough, shortness of breath or chest pain,  back pain, headache, or mood change.   Objective:  No results found. No results for input(s): WBC, HGB, HCT, PLT in the last 72 hours. No results for input(s): NA, K, CL, CO2, GLUCOSE, BUN, CREATININE, CALCIUM in the last 72 hours.  Intake/Output Summary (Last 24 hours) at 10/10/2019 1528 Last data filed at 10/10/2019 1300 Gross per 24 hour  Intake 897 ml  Output --  Net 897 ml     Physical Exam: Vital Signs Blood pressure 125/75, pulse 82, temperature 97.8 F (36.6 C), resp. rate 18, height 5\' 2"  (1.575 m), weight 57.9 kg, last menstrual period 02/12/2011, SpO2 100 %. Constitutional: Vital signs reviewed. Wearing R prosthesis but said she put on this AM; wearing limb guard on L BKA, NAD; in manual w/c at bedside watching TV HEENT: EOMI, oral membranes moist Neck: supple Cardiovascular: RRR without murmur. No JVD    Respiratory: CTA Bilaterally without wheezes or rales. Normal effort    GI: BS +, non-tender, non-distended, soft.  Extremities: No clubbing, cyanosis, or edema. Pulses are 2+ Neurological: Patient is alert no acute distress.  Oriented x3 and follows commands; very concrete.   Skin: .  Right BKA is well-healed, moving well.  LLE has limb guard in place Psych: pleasant     Assessment/Plan: 1. Functional deficits secondary to bilateral BKA which require 3+ hours per day of interdisciplinary therapy in a comprehensive inpatient rehab  setting.  Physiatrist is providing close team supervision and 24 hour management of active medical problems listed below.  Physiatrist and rehab team continue to assess barriers to discharge/monitor patient progress toward functional and medical goals  Care Tool:  Bathing  Bathing activity did not occur: Refused Body parts bathed by patient: Right arm, Left arm, Chest, Abdomen, Front perineal area, Buttocks, Right upper leg, Left upper leg, Face   Body parts bathed by helper: Buttocks, Right upper leg, Left upper leg Body parts n/a: Left lower leg, Right lower leg   Bathing assist Assist Level: Minimal Assistance - Patient > 75%     Upper Body Dressing/Undressing Upper body dressing Upper body dressing/undressing activity did not occur (including orthotics): Refused What is the patient wearing?: Hospital gown only    Upper body assist Assist Level: Set up assist    Lower Body Dressing/Undressing Lower body dressing    Lower body dressing activity did not occur: Refused What is the patient wearing?: Incontinence brief     Lower body assist Assist for lower body dressing: Moderate Assistance - Patient 50 - 74%     Toileting Toileting Toileting Activity did not occur Landscape architect and hygiene only): Refused  Toileting assist Assist for toileting: Minimal Assistance - Patient > 75%     Transfers Chair/bed transfer  Transfers assist     Chair/bed transfer assist level: Contact Guard/Touching assist     Locomotion Ambulation   Ambulation assist   Ambulation activity did not  occur: Safety/medical concerns(new L BKA and new R prosthesis, unable to come to a full stance due to decreased strength/balance)          Walk 10 feet activity   Assist  Walk 10 feet activity did not occur: Safety/medical concerns(new L BKA and new R prosthesis, unable to come to a full stance due to decreased strength/balance)        Walk 50 feet activity   Assist Walk 50  feet with 2 turns activity did not occur: Safety/medical concerns(new L BKA and new R prosthesis, unable to come to a full stance due to decreased strength/balance)         Walk 150 feet activity   Assist Walk 150 feet activity did not occur: Safety/medical concerns(new L BKA and new R prosthesis, unable to come to a full stand due to decreased strength/balance)         Walk 10 feet on uneven surface  activity   Assist Walk 10 feet on uneven surfaces activity did not occur: Safety/medical concerns(new L BKA and new R prosthesis, unable to come to a full stand due to decreased strength/balance)         Wheelchair     Assist Will patient use wheelchair at discharge?: Yes Type of Wheelchair: Manual    Wheelchair assist level: Set up assist Max wheelchair distance: 170'    Wheelchair 50 feet with 2 turns activity    Assist        Assist Level: Set up assist   Wheelchair 150 feet activity     Assist      Assist Level: Set up assist   Blood pressure 125/75, pulse 82, temperature 97.8 F (36.6 C), resp. rate 18, height 5\' 2"  (1.575 m), weight 57.9 kg, last menstrual period 02/12/2011, SpO2 100 %.  Medical Problem List and Plan: 1.  Decreased functional mobility secondary to left transtibial amputation 09/21/2019 with wound VAC as directed as well as history of right BKA 12/31/2018.  Patient does have a right prosthesis.             -patient may shower but leg must be covered.              -ELOS/Goals: 10-14 days, modI in PT, OT, and I in SLP  1/29- Vascular wants pt on Trental after rehab d/c.- starting NTG patch for increased blood flow and started Keflex 250 mg q6 hours for erythema of BKA.  2.  Antithrombotics: -DVT/anticoagulation: Patient has bilateral BKA             -antiplatelet therapy: Aspirin 81 mg daily 3. Pain Management: Oxycodone and Robaxin as needed  1/23 fair control. Discussed massage and sensory feedback today 4. Mood: Provide  emotional support             -antipsychotic agents: N/A 5. Neuropsych: This patient is capable of making decisions on her own behalf. 6. Skin/Wound Care: Routine skin checks 7. Fluids/Electrolytes/Nutrition: Routine in and outs with follow-up chemistries 8.  CKD stage III.  Baseline creatinine 1.21-2.13.  Follow-up Cr 0.94 on 1/20 9.  CAD with CABG October 2019.  Continue aspirin therapy 10.  Acute on chronic diastolic congestive heart failure.  Monitor for any signs of fluid overload   Filed Weights   10/08/19 0517 10/09/19 0500 10/10/19 0526  Weight: 59.7 kg 58.1 kg 57.9 kg    1/26- ordered daily weights  1/27- weight stable.   1/31: stable 11.  History of intracerebral hemorrhage February 2017.  Follow-up outpatient 12.  Diabetes mellitus with peripheral neuropathy.  Hemoglobin A1c 14.1.  SSI.  Patient on Lantus insulin 50 units twice daily prior to admission.    1/23 resume lantus at 5u bid  1/24 some improvement--monitor for pattern   CBG (last 3)  Recent Labs    10/09/19 2108 10/10/19 0617 10/10/19 1148  GLUCAP 196* 152* 151*    1/26- Will start Lantus 5 units QHS since not in computer. Based on BGs 133-355  1/28- bgS 145-218  1/31: elevated range from 177-247 last 5 reads. Will increase Lantus to 6U.   2/1- BGs 150s-190s- will increase to 7 units.  13.  Hypertension.  Norvasc 5 mg daily. Systolics in last 2 days ranging from 141 to 171. Consider increasing Norvasc to 10mg  daily.   1/20: Will increase Norvasc to 10mg  for uncontrolled BP.  1/21: Continues to be elevated. Will add Hydralazine 10mg  daily.   1/22: BP and HR still slightly elevated. Will increase Hydralazine to 10mg  BID.   1/24 fair control, nausea may be increasing SBP sl  1/25- SBP 150s this AM- better overall- will monitor  1/28- BP 136/113  1/30: BP is uncontrolled this morning to 123456 systolic. It appears to have worse control in the mornings and better control at nights. Will maintain current dose of  BP medications and I do not want her to become hypotensive in afternoons/evenings.  1/31: continues to be labile   2/1- BP today is great 120s/70s 14.  Hyperlipidemia.  Lipitor 15.  Constipation: Docusate 100mg  BID. Has not yet had BM. Consider changing to Senna-docusate BID.   1/20: Still constipated, will add daily Miralax  1/23: had a bm on 1/20, small   -still refusing meds   -continued education by staff, encourage use of softeners/laxative  1/24: discussed with pt today. She realizes she needs to get bowels going   -sorbitol today, SSE if needed  1/25- 3 BMs documented in last 24 hours- hasn't changed nausea in AM  1/28- 2 BMs 1/27  2/1- c/o diarrhea x1 episode- since not going regularly, think won't decrease bowel meds.  16. Itching:  hydroxyzine prn.  17. Check U/A and Cx per pt request- urine smell bad- possibly having some stinging with urination, sent UA and Cx  1/29- Nitrite (+)_ already placed on keflex 250 mg q6 hours by Vascular for BKA, so will wait for Cx to see if need additional ABX.   1/30: Communicated with pharmacy this morning regarding UC growing Citrobacter Freundii .Started Bactrim DS BID for 7 days as organism is resistant to Keflex.    LOS: 13 days A FACE TO FACE EVALUATION WAS PERFORMED  Collins Kerby 10/10/2019, 3:28 PM

## 2019-10-10 NOTE — Progress Notes (Signed)
Physical Therapy Session Note  Patient Details  Name: [name] MRN: [id number] Date of Birth: 22-Nov-1965  Today's Date: 10/10/2019 PT Individual Time: 1005-1020 and 1230-1305 PT Individual Time Calculation (min): 15 and 35 min   Short Term Goals: Week 2:  PT Short Term Goal 1 (Week 2): STG=LTG due to short ELOS.  Skilled Therapeutic Interventions/Progress Updates:     Session 1: Patient in w/c in room with L limb guard and shrinker donned and R prosthesis donned upon PT arrival. Patient alert and agreeable to PT session. Patient denied pain during session. Patient reported feeling nauseous this morning. Discussed d/c planning and moving d/c to tomorrow with the patient. Educated on need for participation with functional mobility during sessions and family education. Also provided education on performing slide board transfers at home to the L for increased safety when transferring in that direction. Patient stated agreement.Attempted to call her son during session to provide family education over the phone, since he was unable to attend scheduled family education yesterday. Her son did not pick up during session. She then became nauseous and had 1 bout of emesis, NT and RN made aware. Patient requested to end session due to nausea. Will return to make up missed time at 1230, as patient reported that her son will be available at this time.     Session 2: Patient in w/c in room with L limb guard and shrinker donned and R prosthesis donned upon PT arrival to make up missed time. Patient alert and agreeable to PT session. Patient denied pain during session. Patient reported that she was still feeling nauseous. Called patient's son and discussed early d/c. He stated that they are having renovations on the house that will not be done until Wednesday. Informed CSW team and directed the patient's son to speak with them about his concerns. Patient son participated in family education on the phone about  transfer training, performing squat pivots to the R as preferred transfer with CGA and slide board transfers to the L for safety. Provided B limb care education with handouts, provided to patient, including shrinker, limb guard, and prosthesis wearing schedule and hygiene. Provided general amputee education and pressure relief and positioning to promote ROM. Patient son stated understanding with all education and stated that he felt comfortable, as he participated in family education during the patient's last stay. Patient and her son stated they feel comfortable for d/c, however, both feel concerned about moving d/c date to tomorrow and requested to keep d/c for 2/3. Will discuss with rehab team and CSW. Patient in w/c in room at end of session with breaks locked and all needs within reach.    Therapy Documentation Precautions:  Precautions Precautions: Fall Precaution Comments: wound vac Required Braces or Orthoses: (residual limb protector for LLE) Restrictions Weight Bearing Restrictions: Yes LLE Weight Bearing: Non weight bearing Other Position/Activity Restrictions: limb guard LLE General: PT Amount of Missed Time (min): 10 Minutes PT Missed Treatment Reason: Patient ill (Comment)(nausea) Vital Signs: Therapy Vitals Temp: 98.1 F (36.7 C) Pulse Rate: 98 Resp: 18 BP: (!) 145/85 Patient Position (if appropriate): Sitting Oxygen Therapy SpO2: 99 % O2 Device: Room Air Pain: Pain Assessment Pain Scale: 0-10 Pain Score: 0-No pain Mobility:   Locomotion :    Trunk/Postural Assessment :    Balance:   Exercises:   Other Treatments:      Therapy/Group: Individual Therapy  Saahir Prude L Sheril Hammond PT, DPT  10/10/2019, 4:33 PM

## 2019-10-10 NOTE — Discharge Summary (Signed)
Physician Discharge Summary  [name] MRN: [id number] DOB/AGE: 1966-06-18 54 y.o.  Admit date: 09/27/2019 Discharge date: 10/12/2019  Discharge Diagnoses:  Principal Problem:   S/P bilateral BKA (below knee amputation) ([place]) Active Problems:   Adjustment disorder with mixed anxiety and depressed mood Pain management CAD with CABG CKD stage III Acute on chronic diastolic congestive heart failure History of intracerebral hemorrhage February 2017 Hypertension Diabetes mellitus Hyperlipidemia UTI  Discharged Condition: Stable  Significant Diagnostic Studies: No results found.  Labs:  Basic Metabolic Panel: Recent Labs  Lab 10/11/19 0528  NA 139  K 4.2  CL 102  CO2 25  GLUCOSE 86  BUN 20  CREATININE 1.66*  CALCIUM 8.8*    CBC: Recent Labs  Lab 10/11/19 0528  WBC 8.3  NEUTROABS 4.5  HGB 9.5*  HCT 29.8*  MCV 84.7  PLT 654*    CBG: Recent Labs  Lab 10/10/19 2103 10/11/19 0608 10/11/19 1131 10/11/19 1637 10/11/19 2101  GLUCAP 237* 110* 182* 169* 169*   Family history.  Brother with CVA as well as leukemia.  Paternal grandmother with diabetes.  Denies any colon cancer or esophageal cancer  Brief HPI:   [name] is a 53 y.o. right-handed female with history of anxiety, CKD stage III, acute on chronic diastolic congestive heart failure, CAD with CABG October 2019, remote tobacco abuse, hypertension, intracerebral hemorrhage February 2017 secondary to hypertensive crisis, diabetes mellitus.  Patient well-known to rehab services after right BKA 12/31/2018 received inpatient rehab services was discharged to home.  Per chart review lives with her children 1 level home ramped entrance she has a prosthesis for right lower extremity.  Presented 09/21/2019 with gangrenous changes to the left heel with significant odor and drainage.  She had been on doxycycline for 2 weeks followed by Dr. [name] Given.  No change with conservative care and underwent left  transtibial amputation 09/21/2019 per Dr. [name] Given.  Hospital course pain managed with wound VAC.  Patient was admitted for a comprehensive rehab program.   Hospital Course: [facility] was admitted to rehab 09/27/2019 for inpatient therapies to consist of PT, ST and OT at least three hours five days a week. Past admission physiatrist, therapy team and rehab RN have worked together to provide customized collaborative inpatient rehab.  Pertaining to patient left transtibial amputation 09/21/2019 as well as history of right BKA 12/31/2018.  Wound VAC as directed.  Follow-up per Dr. [name] Given with nitroglycerin patch added for increased blood flow and started on Keflex for wound coverage .  Ongoing wound care as advised and the home health nurse would be arranged.  Pain managed with use of oxycodone as well as Robaxin.  Patient did have a history of CAD with CABG no chest pain or shortness of breath she remained on aspirin therapy.  CKD stage III baseline creatinine 1.21-2.13.  Blood sugars overall monitored hemoglobin A1c of 14.1 insulin therapy as directed she received ongoing counsel regards of maintaining her diet for diabetes.  Blood pressure monitored with Norvasc as well as hydralazine.  Patient would need follow-up with her outpatient PCP.  On 10/06/2019 urine culture showed greater than 100,000 Citrobacter which was resistant to Keflex she was transitioned to Bactrim for coverage of UTI and patient had received Keflex up until change in antibiotics for wound coverage of her limb which was looking much better.  Double antibiotics were not used due to the fact the patient had CKD stage III and close monitoring of renal function.  Blood pressures were monitored on TID basis and monitored  Diabetes has been monitored with ac/hs CBG checks and SSI was use prn for tighter BS control.   She is continent of bowel and bladder.  She has made gains during rehab stay and is attending therapies  She will continue to  receive follow up therapies   after discharge  Rehab course: During patient's stay in rehab weekly team conferences were held to monitor patient's progress, set goals and discuss barriers to discharge. At admission, patient required moderate to max assist stand pivot transfers.  Min guard anterior posterior transfers.  Minimal assist sit to supine.  Supervision upper body bathing moderate assist lower body bathing set up upper body dressing moderate assist lower body dressing  Physical exam.  Blood pressure 155/97 pulse 92 temperature 98.2 respirations 19 oxygen saturations 100% room air General.  Alert and oriented x3 no distress HEENT.  Head is normocephalic atraumatic EOMs intact no nystagmus Neck.  Supple nontender no JVD without thyromegaly Cardiac regular rate rhythm without any extra sounds or murmur heard Respiratory.  Effort normal no respiratory distress without wheezes GI.  Soft nontender positive bowel sounds Extremities no clubbing or cyanosis Neurological alert and oriented follows commands.  Wound VAC in place with limb guard to left BKA right BKA well-healed.  Marlana Salvage  has had improvement in activity tolerance, balance, postural control as well as ability to compensate for deficits. Marlana Salvage has had improvement in functional use RUE/LUE  and RLE/LLE as well as improvement in awareness.  Working with energy conservation techniques.  Wheelchair mobility training with supervision.  Encourage patient to attempt sliding board transfers to mat table car needed some encouragement at times to participate.  Patient instructed in sitting balance and wheelchair while engaged with activities.  Discussed methods of AP transfers for toileting with patient preferring to pivot on her leg at this time.  Full family teaching completed plan discharge to home       Disposition: Discharge to home    Diet: Diabetic diet  Special Instructions: No driving smoking or alcohol  Medications at discharge 1.   Tylenol as needed 2.  Norvasc 10 mg p.o. daily 3.  Aspirin 81 mg p.o. daily 4.  Hydralazine 10 mg p.o. twice daily 5.  Lantus insulin 7 units nightly 6.  Robaxin 500 mg every 6 hours as needed muscle spasms 7.  Nitro-Dur patch 0.2 mg daily 8.  Oxycodone 5 to 10 mg every 4 hours as needed pain 9.  MiraLAX daily hold for loose stools 10.  Senokot S2 tabs p.o. twice daily 11.  Bactrim DS 1 tablet p.o. every 12 hours x4 days 12.  Lexapro 10 mg daily 13.  Synthroid 25 mcg daily 14.  Reglan 5 mg 4 times daily   Discharge Instructions    Ambulatory referral to Physical Medicine Rehab   Complete by: As directed    Moderate complexity follow-up 1 to 2 weeks left BKA with history of right BKA      Follow-up Information    Lovorn, Jinny Blossom, MD Follow up.   Specialty: Physical Medicine and Rehabilitation Why: Office to call for appointment Contact information: [id number] [place] [address] [phone]        [name], MD Follow up.   Specialty: Orthopedic Surgery Why: Call for appointment Contact information: [place] [address] [phone]        Mosie Lukes, MD Follow up.   Specialty: Family  Medicine Contact information: [place] [address] [phone]        Elouise Munroe, MD .   Specialties: Cardiology, Radiology Contact information: [address] [place] [address] [phone]           Signed: Cathlyn Parsons 10/12/2019, 5:17 AM

## 2019-10-10 NOTE — Progress Notes (Signed)
Occupational Therapy Session Note  Patient Details  Name: [name] MRN: [id number] Date of Birth: 11-Jul-1966  Today's Date: 10/10/2019 OT Individual Time: 5427-1566 OT Individual Time Calculation (min): 13 min  and Today's Date: 10/10/2019 OT Missed Time: 41 Minutes Missed Time Reason: Patient fatigue;Patient ill (comment)   Short Term Goals: Week 1:  OT Short Term Goal 1 (Week 1): na STG = LTG  Skilled Therapeutic Interventions/Progress Updates:    Pt received in w/c, upon entering room immediately stating "no I can't do anything today" and waving her hands. Pt reporting she has had nausea all day. Pt then stated "maybe I could push the chair around to see if getting out of the room would help". Agreed that activity would be good for pt and she completed 300+ ft of w/c propulsion, increasing endurance and BUE strength. Discussed d/c while she pushed w/c. Pt declined any further activity and was left sitting in her chair with all needs met.   Therapy Documentation Precautions:  Precautions Precautions: Fall Precaution Comments: wound vac Required Braces or Orthoses: (residual limb protector for LLE) Restrictions Weight Bearing Restrictions: Yes LLE Weight Bearing: Non weight bearing Other Position/Activity Restrictions: limb guard LLE   Therapy/Group: Individual Therapy  Curtis Sites 10/10/2019, 12:19 PM

## 2019-10-10 NOTE — Progress Notes (Signed)
Patient complained of nausea, vomited x1. PRN zofran given. Will continue plan of care.  Audie Clear, LPN

## 2019-10-11 ENCOUNTER — Inpatient Hospital Stay (HOSPITAL_COMMUNITY): Payer: BC Managed Care – PPO | Admitting: Occupational Therapy

## 2019-10-11 ENCOUNTER — Inpatient Hospital Stay (HOSPITAL_COMMUNITY): Payer: BC Managed Care – PPO

## 2019-10-11 LAB — COMPREHENSIVE METABOLIC PANEL
ALT: 17 U/L (ref 0–44)
AST: 14 U/L — ABNORMAL LOW (ref 15–41)
Albumin: 2.2 g/dL — ABNORMAL LOW (ref 3.5–5.0)
Alkaline Phosphatase: 93 U/L (ref 38–126)
Anion gap: 12 (ref 5–15)
BUN: 20 mg/dL (ref 6–20)
CO2: 25 mmol/L (ref 22–32)
Calcium: 8.8 mg/dL — ABNORMAL LOW (ref 8.9–10.3)
Chloride: 102 mmol/L (ref 98–111)
Creatinine, Ser: 1.66 mg/dL — ABNORMAL HIGH (ref 0.44–1.00)
GFR calc Af Amer: 40 mL/min — ABNORMAL LOW (ref >60)
GFR calc non Af Amer: 34 mL/min — ABNORMAL LOW (ref >60)
Glucose, Bld: 86 mg/dL (ref 70–99)
Potassium: 4.2 mmol/L (ref 3.5–5.1)
Sodium: 139 mmol/L (ref 135–145)
Total Bilirubin: <0.1 mg/dL — ABNORMAL LOW (ref 0.3–1.2)
Total Protein: 6.4 g/dL — ABNORMAL LOW (ref 6.5–8.1)

## 2019-10-11 LAB — CBC WITH DIFFERENTIAL/PLATELET
Abs Immature Granulocytes: 0.03 10*3/uL (ref 0.00–0.07)
Basophils Absolute: 0.1 10*3/uL (ref 0.0–0.1)
Basophils Relative: 1 %
Eosinophils Absolute: 0.3 10*3/uL (ref 0.0–0.5)
Eosinophils Relative: 3 %
HCT: 29.8 % — ABNORMAL LOW (ref 36.0–46.0)
Hemoglobin: 9.5 g/dL — ABNORMAL LOW (ref 12.0–15.0)
Immature Granulocytes: 0 %
Lymphocytes Relative: 30 %
Lymphs Abs: 2.5 10*3/uL (ref 0.7–4.0)
MCH: 27 pg (ref 26.0–34.0)
MCHC: 31.9 g/dL (ref 30.0–36.0)
MCV: 84.7 fL (ref 80.0–100.0)
Monocytes Absolute: 1 10*3/uL (ref 0.1–1.0)
Monocytes Relative: 12 %
Neutro Abs: 4.5 10*3/uL (ref 1.7–7.7)
Neutrophils Relative %: 54 %
Platelets: 654 10*3/uL — ABNORMAL HIGH (ref 150–400)
RBC: 3.52 MIL/uL — ABNORMAL LOW (ref 3.87–5.11)
RDW: 13.4 % (ref 11.5–15.5)
WBC: 8.3 10*3/uL (ref 4.0–10.5)
nRBC: 0 % (ref 0.0–0.2)

## 2019-10-11 LAB — GLUCOSE, CAPILLARY
Glucose-Capillary: 110 mg/dL — ABNORMAL HIGH (ref 70–99)
Glucose-Capillary: 182 mg/dL — ABNORMAL HIGH (ref 70–99)
Glucose-Capillary: 169 mg/dL — ABNORMAL HIGH (ref 70–99)
Glucose-Capillary: 169 mg/dL — ABNORMAL HIGH (ref 70–99)

## 2019-10-11 MED ORDER — NITROGLYCERIN 0.2 MG/HR TD PT24: 0.2000 mg | MEDICATED_PATCH | Freq: Every day | TRANSDERMAL | 0 refills | Status: DC

## 2019-10-11 MED ORDER — AMLODIPINE BESYLATE 10 MG PO TABS: 10.0000 mg | ORAL_TABLET | Freq: Every day | ORAL | 1 refills | Status: DC

## 2019-10-11 MED ORDER — INSULIN GLARGINE 100 UNIT/ML SOLOSTAR PEN: 7.0000 [IU] | PEN_INJECTOR | Freq: Every day | SUBCUTANEOUS | 11 refills | Status: DC

## 2019-10-11 MED ORDER — OXYCODONE HCL 5 MG PO TABS: 5.0000 mg | ORAL_TABLET | ORAL | 0 refills | Status: DC | PRN

## 2019-10-11 MED ORDER — METOCLOPRAMIDE HCL 5 MG PO TABS
5.0000 mg | ORAL_TABLET | Freq: Three times a day (TID) | ORAL | Status: DC
  Administered 2019-10-11 (×3): 5 mg via ORAL
  Filled 2019-10-11 (×5): qty 1

## 2019-10-11 MED ORDER — SULFAMETHOXAZOLE-TRIMETHOPRIM 800-160 MG PO TABS: 1.0000 | ORAL_TABLET | Freq: Two times a day (BID) | ORAL | 0 refills | Status: DC

## 2019-10-11 MED ORDER — POLYETHYLENE GLYCOL 3350 17 G PO PACK: 17.0000 g | PACK | Freq: Every day | ORAL | 0 refills | Status: AC

## 2019-10-11 MED ORDER — ATORVASTATIN CALCIUM 80 MG PO TABS: 80.0000 mg | ORAL_TABLET | Freq: Every day | ORAL | 1 refills | Status: AC

## 2019-10-11 MED ORDER — HYDRALAZINE HCL 10 MG PO TABS: 10.0000 mg | ORAL_TABLET | Freq: Two times a day (BID) | ORAL | 1 refills | Status: DC

## 2019-10-11 MED ORDER — PANTOPRAZOLE SODIUM 40 MG PO TBEC: 40.0000 mg | DELAYED_RELEASE_TABLET | Freq: Every day | ORAL | 0 refills | Status: AC

## 2019-10-11 MED ORDER — METHOCARBAMOL 500 MG PO TABS: 500.0000 mg | ORAL_TABLET | Freq: Four times a day (QID) | ORAL | 0 refills | Status: DC | PRN

## 2019-10-11 NOTE — Progress Notes (Signed)
Occupational Therapy Session Note  Patient Details  Name: VESTER CRECCO MRN: [id number] Date of Birth: [birth year]  Today's Date: 10/11/2019 OT Individual Time: 1300-1410 OT Individual Time Calculation (min): 70 min    Short Term Goals: Week 1:  OT Short Term Goal 1 (Week 1): na STG = LTG  Skilled Therapeutic Interventions/Progress Updates:    Patient seated in w/c, alert and agrees to participate in therapy session.  She is able to propel w/c into bathroom, requires moderate cues to set up w/c and arm rests for transfer to drop arm commode - she requires min a for transfer to the right, mod A to left back to w/c,  She requires moderate assist for clothing management but is able to complete hygiene after urination.  She is agreed to change pants while seated on commode with mod A due to positioning (reviewed option of donning clothing at bed level for increased independence).  She is able to complete grooming tasks seated in w/c at sink with set up.   She is able to propel w/c to/from therapy gym spaces.  Completed UB ergometer 2 x 5 minutes, w/c push ups x 10 reps. Reviewed and practiced donning left limb guard - requires min A and min cues.  Reviewed importance of residual limb skin care and monitoring, change of position and appropriate prosthetic schedule - she continues to require ongoing reinforcement.   Sit pivot transfer to therapy mat - min A to right.  She tolerated unsupported sitting balance and UB conditioning activities with CS for 15 minutes,  Attempted sit pivot transfer back to w/c but was unable to shift weight effectively for safe transfer - encouraged use of SB which she agreed to try - with cues and set up she is able to transfer to the left with CGA and ongoing encouragement.  She remained in the w/c at close of session.  Call bell and tray table in reach.    Therapy Documentation Precautions:  Precautions Precautions: Fall Precaution Comments: wound vac Required Braces  or Orthoses: (residual limb protector for LLE) Restrictions Weight Bearing Restrictions: Yes LLE Weight Bearing: Non weight bearing Other Position/Activity Restrictions: limb guard LLE General: General PT Missed Treatment Reason: Patient ill (Comment);Patient fatigue(nausea) Vital Signs: Therapy Vitals Temp: 98.3 F (36.8 C) Pulse Rate: 95 Resp: 20 BP: (!) 141/78 Patient Position (if appropriate): Sitting Oxygen Therapy SpO2: 99 % O2 Device: Room Air Pain: Pain Assessment Pain Scale: 0-10 Pain Score: 0-No pain Other Treatments:     Therapy/Group: Individual Therapy  Carlos Levering 10/11/2019, 3:23 PM

## 2019-10-11 NOTE — Progress Notes (Signed)
Team Conference Report to Patient/Family  Team Conference discussion was reviewed with the patient, including goals, any changes in plan of care and target discharge date.  Patient  expressed understanding and is in agreement.  The patient has a target discharge date of 10/12/19. Son is aware of the discharge on 10/12/19 and is in agreement with the plan.  Dorien Chihuahua B 10/11/2019, 3:03 PM

## 2019-10-11 NOTE — Progress Notes (Signed)
Occupational Therapy Discharge Summary  Patient Details  Name: [name] MRN: [id number] Date of Birth: May 20, 1966     Patient has met 4 of 8 long term goals due to improved balance and postural control.  Patient to discharge at overall Mod Assist level.  Patient's care partner is independent to provide the necessary physical assistance at discharge.    Reasons goals not met: mobility and self care goals not met due to limited willingness to participate in functional transfer and adl training on a routine basis.  Nausea and fatigue have been factors at times as well as fear of falling but patient also refused to complete tasks at a bed level and/or without change of position throughout her stay.  She was able to maintain her UB strength as she would often participate in conditioning and w/c propulsion activities.  She continues to require cues and encouragement to complete monitoring and limb care on a routine basis to include weight shift, off loading, skin inspection, appropriate wearing schedule for right prosthesis, & left limb guard positioning.   Family education completed via phone as son unable to attend session as scheduled.  He states that he does not have any questions at this time.    Recommendation:  Patient will benefit from ongoing skilled OT services in home health setting to continue to advance functional skills in the area of BADL and iADL.  Equipment: drop arm commode, transfer board  Reasons for discharge: lack of progress toward goals  Patient/family agrees with progress made and goals achieved: Yes  OT Discharge Precautions/Restrictions  Precautions Precautions: Fall Restrictions Weight Bearing Restrictions: Yes LLE Weight Bearing: Non weight bearing Other Position/Activity Restrictions: limb guard LLE General PT Missed Treatment Reason: Patient ill (Comment);Patient fatigue(nausea) Vital Signs Therapy Vitals Temp: 98.3 F (36.8 C) Pulse Rate:  95 Resp: 20 BP: (!) 141/78 Patient Position (if appropriate): Sitting Oxygen Therapy SpO2: 99 % O2 Device: Room Air Pain Pain Assessment Pain Scale: 0-10 Pain Score: 0-No pain ADL ADL Eating: Independent Where Assessed-Eating: Wheelchair Grooming: Setup Where Assessed-Grooming: Sitting at sink, Wheelchair Upper Body Bathing: Setup Where Assessed-Upper Body Bathing: Sitting at sink, Wheelchair Lower Body Bathing: Moderate assistance Where Assessed-Lower Body Bathing: Sitting at sink, Wheelchair Upper Body Dressing: Supervision/safety Where Assessed-Upper Body Dressing: Wheelchair Lower Body Dressing: Moderate assistance Where Assessed-Lower Body Dressing: Wheelchair Toileting: Moderate assistance Where Assessed-Toileting: Other (Comment)(drop arm commode over toilet) Toilet Transfer: Moderate assistance Toilet Transfer Method: Squat pivot(with right prosthesis) Toilet Transfer Equipment: Drop arm bedside commode ADL Comments: patient declined LB dressing due to bowel issues today Vision Baseline Vision/History: Wears glasses Wears Glasses: At all times Patient Visual Report: Other (comment)(no change, continues with poor acuity)   Cognition Overall Cognitive Status: Within Functional Limits for tasks assessed Arousal/Alertness: Awake/alert Orientation Level: Oriented X4 Sensation Sensation Additional Comments: UB intact Coordination Fine Motor Movements are Fluid and Coordinated: Yes Coordination and Movement Description: new L BKA, previous R BKA with new prosthesis Finger Nose Finger Test: [facility] Motor  Motor Motor - Discharge Observations: good unsupported sitting balance, able to propel w/c Mobility  Bed Mobility Rolling Right: Independent Rolling Left: Independent Supine to Sit: Supervision/Verbal cueing Sit to Supine: Supervision/Verbal cueing    Extremity/Trunk Assessment RUE Assessment RUE Assessment: Within Functional Limits LUE Assessment LUE  Assessment: Within Functional Limits   [name] A Dylin Breeden 10/11/2019, 4:19 PM

## 2019-10-11 NOTE — Progress Notes (Signed)
Pt slept well throughout most of shift. Pt slept in w/c per choice first half of shift and was then assisted to bed. Repositioned at this time for comfort. No c/o pain over 2/10. No prn medication requested. Pt assisted to restroom x1 at beginning of shift.  No c/o nausea nor vomiting this shift. Pt was educated on having something in her stomach prior to taking antibiotic as that will help stop upset stomach. Pt verbalized understanding and ate gram crackers prior to taking bactrim.  No s/s of distress. Bed in lowest position with call bell within reach.

## 2019-10-11 NOTE — Progress Notes (Signed)
Physical Therapy Discharge Summary  Patient Details  Name: [name] MRN: [id number] Date of Birth: 01/12/1966  Today's Date: 10/11/2019    Patient has met 2 of 9 long term goals due to improved activity tolerance, improved balance, improved postural control, increased strength, increased range of motion, decreased pain, ability to compensate for deficits and improved awareness.  Patient to discharge at a wheelchair level [place].   Patient's care partner unavailable for hands-on family education, patient's son participated in education over the phone with PT and reports that he feels comfortable to provide the necessary physical and cognitive assistance at discharge.  Reasons goals not met: Patient with limited participation with therapies, specifically transfer training due to nausea, anxiety, and fatigue throughout stay.  She continues to require cues and encouragement to complete monitoring and limb care on a routine basis to include weight shift, off loading, skin inspection, appropriate wearing schedule for right prosthesis, & left limb guard positioning.   Family education completed via phone as son unable to attend session as scheduled.  He states that he does not have any questions at this time and reports that they are comfortable providing the level of assist that the patient needs at d/c.   Recommendation:  Patient will benefit from ongoing skilled PT services in home health setting to continue to advance safe functional mobility, address ongoing impairments in balance, strength, ROM, functional mobility, activity tolerance, amputee education, patient/caregiver education, and minimize fall risk.  Equipment: L amputee pad for patient's personal w/c  Reasons for discharge: lack of progress toward goals  Patient/family agrees with progress made and goals achieved: Yes  PT Discharge Precautions/Restrictions Precautions Precautions: Fall Restrictions Weight Bearing  Restrictions: Yes LLE Weight Bearing: Non weight bearing Other Position/Activity Restrictions: limb guard LLE Vital Signs Therapy Vitals Temp: 98.3 F (36.8 C) Pulse Rate: 95 Resp: 20 BP: (!) 141/78 Patient Position (if appropriate): Sitting Oxygen Therapy SpO2: 99 % O2 Device: Room Air Pain Pain Assessment Pain Scale: 0-10 Pain Score: 0-No pain Vision/Perception  Perception Perception: Within Functional Limits Praxis Praxis: Intact  Cognition Overall Cognitive Status: History of cognitive impairments - at baseline Arousal/Alertness: Awake/alert Orientation Level: Oriented to person;Oriented to place;Oriented to time;Oriented to situation Attention: Focused;Sustained Focused Attention: Appears intact Sustained Attention: Appears intact Memory: Impaired Memory Impairment: Decreased recall of new information;Decreased short term memory Decreased Short Term Memory: Verbal basic;Functional basic Problem Solving: Impaired Problem Solving Impairment: Functional basic Safety/Judgment: Appears intact Sensation Sensation Light Touch: Impaired by gross assessment Light Touch Impaired Details: Impaired LLE(decreased sensation of inferior residual limb) Proprioception: Appears Intact Additional Comments: UB intact Coordination Fine Motor Movements are Fluid and Coordinated: Yes Coordination and Movement Description: new L BKA, previous R BKA with new prosthesis Finger Nose Finger Test: [facility] Motor  Motor Motor - Discharge Observations: good unsupported sitting balance, able to propel w/c  Mobility Bed Mobility Rolling Right: Independent Rolling Left: Independent Supine to Sit: Supervision/Verbal cueing Sit to Supine: Supervision/Verbal cueing Transfers Transfers: Squat Pivot Transfers Squat Pivot Transfers: Minimal Assistance - Patient > 75% Transfer (Assistive device): Other (Comment)(seating system of w/c, transfer board recommended) Locomotion  Gait Ambulation:  No(new L BKA, previous R BKA with new prosthesis, limited standing tolerance at this time) Gait Gait: No(new L BKA, previous R BKA with new prosthesis, limited standing tolerance at this time) Stairs / Additional Locomotion Stairs: No(new L BKA, previous R BKA with new prosthesis, limited standing tolerance at this time) Product manager Mobility: Yes Wheelchair Assistance: Set up  assist Wheelchair Propulsion: Both upper extremities Wheelchair Parts Management: Needs assistance(decreased visual acuity) Distance: >200 ft  Trunk/Postural Assessment  Cervical Assessment Cervical Assessment: Exceptions to WFL(forward head) Thoracic Assessment Thoracic Assessment: Exceptions to WFL(rounded shoulders) Lumbar Assessment Lumbar Assessment: Exceptions to WFL(posterior pelvic tilt) Postural Control Postural Control: Deficits on evaluation(decreased, new L BKA, previous R BKA with new prosthesis)  Balance Balance Balance Assessed: Yes Static Sitting Balance Static Sitting - Balance Support: No upper extremity supported;Feet unsupported Static Sitting - Level of Assistance: 5: Stand by assistance Dynamic Sitting Balance Dynamic Sitting - Balance Support: No upper extremity supported;During functional activity;Feet unsupported Dynamic Sitting - Level of Assistance: 5: Stand by assistance Dynamic Sitting - Balance Activities: Lateral lean/weight shifting;Forward lean/weight shifting;Reaching for objects Extremity Assessment  RUE Assessment RUE Assessment: Within Functional Limits LUE Assessment LUE Assessment: Within Functional Limits RLE Assessment RLE Assessment: Exceptions to [facility] Active Range of Motion (AROM) Comments: knee extension lacking ~5 degrees, knee flexion >90 degrees in sitting, unable to formally assess due to patient declining transfer to supine General Strength Comments: Grossly in sitting: hip flexion/abduction/adduction 4-/5, knee flexion/extension 4+/5 LLE  Assessment LLE Assessment: Exceptions to [facility] Active Range of Motion (AROM) Comments: Lacking ~25 degrees knee extension and knee flexion ~75 degrees limited by pain/edema in sitting, unable to formally assess due to patient declining to transfer to supine position General Strength Comments: Grossly in sitting: hip flexion/abduction/adduction 4-/5, knee flexion/extension at least 3/5 unable to formally assess due to pain    Edrick Whitehorn L Makeya Hilgert PT, DPT  10/11/2019, 5:29 PM

## 2019-10-11 NOTE — Discharge Instructions (Signed)
Inpatient Rehab Discharge Instructions  ANUPAMA BLOOM Discharge date and time: No discharge date for patient encounter.   Activities/Precautions/ Functional Status: Activity: activity as tolerated Diet: diabetic diet Wound Care: keep wound clean and dry Functional status:  ___ No restrictions     ___ Walk up steps independently ___ 24/7 supervision/assistance   ___ Walk up steps with assistance ___ Intermittent supervision/assistance  ___ Bathe/dress independently ___ Walk with walker     _x__ Bathe/dress with assistance ___ Walk Independently    ___ Shower independently ___ Walk with assistance    ___ Shower with assistance ___ No alcohol     ___ Return to work/school ________    COMMUNITY REFERRALS UPON DISCHARGE:    Home Health:   PT     OT     RN                     Agency:  [facility] Phone:  [phone]    Medical Equipment/Items Ordered:  Amputee pad for w/c, drop arm commode and transfer board                                                      Agency/Supplier:  [facility] @ [phone]     Special Instructions: No driving smoking or alcohol   My questions have been answered and I understand these instructions. I will adhere to these goals and the provided educational materials after my discharge from the hospital.  Patient/Caregiver Signature _______________________________ Date __________  Clinician Signature _______________________________________ Date __________  Please bring this form and your medication list with you to all your follow-up doctor's appointments.

## 2019-10-11 NOTE — Patient Care Conference (Signed)
Inpatient RehabilitationTeam Conference and Plan of Care Update Date: 10/11/2019   Time: 11:50 AM   Patient Name: [name]      Medical Record Number: [id number]  Date of Birth: Nov 03, 1965 Sex: Female         Room/Bed: 4M13C/4M13C-01 Payor Info: Payor: [place] / Plan: BCBS COMM PPO / Product Type: *No Product type* /    Admit Date/Time:  09/27/2019  5:12 PM  Primary Diagnosis:  S/P bilateral BKA (below knee amputation) [facility])  Patient Active Problem List   Diagnosis Date Noted  . Adjustment disorder with mixed anxiety and depressed mood   . S/P bilateral BKA (below knee amputation) ([place]) 09/27/2019  . Acute osteomyelitis of left foot ([place]) 09/21/2019  . Subacute osteomyelitis, left ankle and foot ([place])   . Pressure injury of left heel, unstageable ([place]) 02/09/2019  . Hypoglycemia   . Diabetes mellitus type 2 in nonobese (HCC)   . Labile blood pressure   . Labile blood glucose   . Phantom limb pain ([place])   . CKD (chronic kidney disease), stage III   . Hypothyroidism   . Essential hypertension   . Chronic diastolic congestive heart failure ([place])   . Coronary artery disease involving coronary bypass graft of native heart without angina pectoris   . Poorly controlled type 2 diabetes mellitus with peripheral neuropathy ([place])   . Acute on chronic anemia   . History of below knee amputation, right ([place]) 12/31/2018  . S/P CABG x 3 06/28/2018  . Coronary artery disease involving native coronary artery of native heart with angina pectoris ([place]) 06/25/2018  . Type 2 diabetes mellitus with vascular disease ([place]) 06/24/2018  . Acute on chronic diastolic CHF (congestive heart failure) ([place]) 06/24/2018  . Non-ST elevation (NSTEMI) myocardial infarction ([place]) 06/24/2018  . Chest pain 06/23/2018  . Glaucoma 09/17/2017  . Diabetic retinopathy ([place]) 09/17/2017  . Otitis media 11/10/2016  . Decreased visual acuity 11/10/2016  . Spinal stenosis, lumbar region with neurogenic  claudication 10/22/2016  . Other spondylosis with radiculopathy, lumbar region 10/13/2016  . Lipoma of abdominal wall 10/05/2016  . Severe protein-calorie malnutrition ([place]) 09/04/2016  . Anemia of chronic disease 09/04/2016  . Diabetic polyneuropathy associated with type 2 diabetes mellitus ([place]) 05/07/2016  . Orthostatic hypotension 05/07/2016  . Cerebrovascular disease 04/27/2016  . N&V (nausea and vomiting) 04/01/2016  . ICH (intracerebral hemorrhage) ([place]) 03/30/2016  . Visit for preventive health examination 05/21/2015  . Breast cancer screening 05/21/2015  . Arm lesion 07/02/2014  . Pain in limb 01/13/2014  . Neuropathic pain of both legs 01/13/2014  . Hip pain 01/12/2014  . Allergic rhinitis 03/10/2013  . Back pain 03/08/2013  . Depression 12/18/2012  . PVD (peripheral vascular disease) ([place]) 01/21/2012  . Hyperlipidemia 12/06/2010  . Overweight(278.02) 12/06/2010  . RESTLESS LEG SYNDROME 10/25/2010  . Migraine without aura 10/07/2010  . Hereditary and idiopathic peripheral neuropathy 10/07/2010  . Essential hypertension, benign 10/07/2010  . DISTURBANCE OF SKIN SENSATION 10/07/2010  . HEART MURMUR, HX OF 10/07/2010  . NEPHROLITHIASIS, HX OF 10/07/2010    Expected Discharge Date: Expected Discharge Date: 10/12/19  Team Members Present: Physician leading conference: Dr. Courtney Heys Social Worker Present: Lennart Pall, LCSW Nurse Present: Dorien Chihuahua, Edwina Barth, LPN Case Manager: Karene Fry, RN PT Present: Apolinar Junes, PT OT Present: Elisabeth Most, OT SLP Present: Stormy Fabian, SLP PPS Coordinator present : Gunnar Fusi, Novella Olive, PT     Current Status/Progress Goal Weekly Team Focus  Bowel/Bladder   pt is continent of b+b  pt to remain continent  assist pt with toileting to maintain continence   Swallow/Nutrition/ Hydration             ADL's   patient has not participated in adl training or functional transfers during OT sessions  since last week due to refusal, family education with son over the phone as he did not attend session as scheduled  set up/ S LB adl, min A toileting, Supervision/set up transfers  ongoing attempts at functional task completion/adl and transfer training,   Mobility   Supervision bed mobility, CGA squat pivot transfers to the R and slide board transfers to the L with R prosthesis, CGA-min A A/P transfers, supervision w/c mobility >200 ft  Independent bed mobility, supervision basic transfers, min A car and furniture transfers, mod A sit<>stand, and supervision w/c mobility  Transfer training, patient/family education and d/c planning   Communication             Safety/Cognition/ Behavioral Observations            Pain   pt c/o pain have decreased  pt pain level; decreased and medication administered with complaints  assess pain level qshift and prn and medicate when c/o pain are verbalized   Skin   LBKA wound with no treatments; stump shrinker in place  continue LBKA wound healing  assess skin and LBKA wound qshift and prn    Rehab Goals Patient on target to meet rehab goals: Yes Rehab Goals Revised: Patient goals were revised and discussed with the patient and her son who are in agreement with downgraded goals and report they are able to provide needed assistance *See Care Plan and progress notes for long and short-term goals.     Barriers to Discharge  Current Status/Progress Possible Resolutions Date Resolved   Nursing                  PT  Home environment access/layout  Family having renovations done to home, home is unsafe for patient until they are complete on 2/3.  Patient with limited participation in therapies and refuses transfer training most sessions. Initiated family education over the phone on 2/1 in preparation for d/c on 2/3, family to provide 24/7 assist.           OT                  SLP                SW Inaccessible home environment;Home environment  access/layout Family remodeling bathroom and bedroom due to bil amp space needs Plan to discharge home with son and daughter in law who can provide 24/7 assist          Discharge Planning/Teaching Needs:  Home with son and daughter in law  Incision care/dressing changes   Team Discussion: L BKA, needs HH labs, on abx for UTI.  RN zofram for vomiting, oxy for pain, anxiety yesterday, staples in place.  OT refused daily, does w/c pushups and exercises, talked to son, son did not come for fam ed on weekend.  PT toilet transfers min/CGA, AP transfers emotional about it, doing mostly education in sessions, using prosthesis, poor recall.   Revisions to Treatment Plan: N/A     Medical Summary Current Status: c/o N/V with Bactrim- continent of B/B; staples still in place Weekly Focus/Goal: refused OT/ADLs to change clothes, shower, wash hair etc- declined every single one/every  time; has ability, but just won't; can only get her do exercises, but nothing functional  Barriers to Discharge: Other (comments);Behavior;Decreased family/caregiver support;Home enviroment access/layout;Weight bearing restrictions;Wound care  Barriers to Discharge Comments: N/V with Bactrim; transfers CGA-min assist; SB or squat pivot; refused learning car transfers. Possible Resolutions to Barriers: PT-similar with PT- poor with function- 2-3 transfers the whole time here; most of time has been about education; keeps wearing prosthesis for >12 hrs.   Continued Need for Acute Rehabilitation Level of Care: The patient requires daily medical management by a physician with specialized training in physical medicine and rehabilitation for the following reasons: Direction of a multidisciplinary physical rehabilitation program to maximize functional independence : Yes Medical management of patient stability for increased activity during participation in an intensive rehabilitation regime.: Yes Analysis of laboratory values and/or  radiology reports with any subsequent need for medication adjustment and/or medical intervention. : Yes   I attest that I was present, lead the team conference, and concur with the assessment and plan of the team.   Jodell Cipro M 10/11/2019, 8:51 PM   Team conference was held via web/ teleconference due to [place] - 19

## 2019-10-11 NOTE — Progress Notes (Addendum)
Physical Therapy Session Note  Patient Details  Name: [name] MRN: [id number] Date of Birth: July 11, 1966  Today's Date: 10/11/2019 PT Individual Time: 1000-1030 and 1445-1510 PT Individual Time Calculation (min): 30 min and 25 min  Short Term Goals: Week 2:  PT Short Term Goal 1 (Week 2): STG=LTG due to short ELOS.  Skilled Therapeutic Interventions/Progress Updates:     Session 1: Patient in w/c in room upon PT arrival. Patient alert and agreeable to PT session. Patient denied pain during session, however, reported that she continues to feel nauseous from her medications, RN aware. Noted that she had her R prosthesis liner folded down with notable redness and skin irritation where the liner had been. Patient removed her prosthesis independently. Noted increased redness in areas of increased pressure, all areas blanchable. Patient reproted that she wore her prosthesis from approximately 7am in the morning to 1 am at night. PT reinforced education about wearing her prosthesis no more than 12 hours a day. Educated patient on leaving her prosthesis off today except for transfers to allow her skin to rest. No signs of skin breakdown and decreased redness after 20 min with prosthesis off. Discussed d/c planning and transfers, patient able to teach back use of slide board and board placement for transfers. She declined performing any transfers or exercises today due to nausea and fatigue. Reviewed prosthesis and L residucal limb care handouts before ending session. Patient in w/c in room at end of session with breaks locked and all needs within reach. Patient missed 30 min of skilled PT due to nausea/fatigue, RN made aware. Will attempt to make-up missed time as able.    Session 2: Patient in w/c in room upon PT arrival. Patient declined to perform any mobility training or therapeutic exercise during session. Assessed strength and ROM with patient in sitting for discharge and discussed patient's  progress with PT and PT goals. Patient stated understanding and that she felt she would make more progress at home with HHPT. PT educated on safe technique for transfers and reinforced prosthesis, shrinker, and limb guard education. Encouraged the patient to alternate sitting in the w/c and lying in the bed for pressure relief and stretching her hips and knees. Discussed use of limb pad when she is not wearing her prostheses. Patient doffed her prosthesis independently. There was notable skin irritation and redness where the liner was. Instructed the patient to leave the prosthesis off unless for transfers with staff today and to follow the CPO's instructions for wearing her prosthesis to prevent skin irritation.  Educated patient on fall risk/prevention, home modifications to prevent falls, and activation of emergency services in the event of a fall. Also, discussed having a therapist assist her into the car tomorrow at d/c for safety and family education at 1400. Patient in agreement.   Patient continued to decline any further mobility training or exercises and stated understanding for all education provided. Patient in w/c in room with breaks locked and all needs in reach at end of session.   Therapy Documentation Precautions:  Precautions Precautions: Fall Precaution Comments: wound vac Required Braces or Orthoses: (residual limb protector for LLE) Restrictions Weight Bearing Restrictions: Yes LLE Weight Bearing: Non weight bearing Other Position/Activity Restrictions: limb guard LLE General: PT Amount of Missed Time (min): 30 Minutes PT Missed Treatment Reason: Patient ill (Comment);Patient fatigue(nausea)    Therapy/Group: Individual Therapy  Ireene Ballowe L Daylon Lafavor PT, DPT  10/11/2019, 12:40 PM

## 2019-10-11 NOTE — Care Management (Signed)
   The overall goal for the admission was met for:   Discharge location: Home with son and daughter in law  Length of Stay: 15 days with discharge 10/12/19  Discharge activity level: Discharge level, patient performing squat pivots to the R as preferred transfer with CGA and slide board transfers to the L for safety. Complete bed level LB adl to ensure safety.  Home/community participation: Warden/ranger provided included: MD, RD, PT, OT, SLP, RN, CM, Pharmacy, Neuropsych and SW  Financial Services: Private Insurance: BCBS PPO  Follow-up services arranged: Home Health: PT< OT< RN with [employer], DME: Transfer board, amputee support pad  and drop arm commode from [employer] and Patient/Family has no preference for HH/DME agencies  Comments (or additional information): [facility] [phone] Family education completed via phone with son 10/10/19.  Patient/Family verbalized understanding of follow-up arrangements: Yes  Individual responsible for coordination of the follow-up plan:  Son: [name]: [phone] Confirmed correct DME delivered: Margarito Liner 10/11/2019    Margarito Liner

## 2019-10-11 NOTE — Progress Notes (Signed)
[place] PHYSICAL MEDICINE & REHABILITATION PROGRESS NOTE   Subjective/Complaints:  Pt reports nauseated every morning-  No matter what; slightly better when takes food prior to ABX dose.     ROS: Patient denies fever, rash, sore throat, blurred vision, cough, shortness of breath or chest pain,  back pain, headache, or mood change.   Objective:  No results found. Recent Labs    10/11/19 0528  WBC 8.3  HGB 9.5*  HCT 29.8*  PLT 654*   Recent Labs    10/11/19 0528  NA 139  K 4.2  CL 102  CO2 25  GLUCOSE 86  BUN 20  CREATININE 1.66*  CALCIUM 8.8*    Intake/Output Summary (Last 24 hours) at 10/11/2019 0905 Last data filed at 10/11/2019 0846 Gross per 24 hour  Intake 402 ml  Output -  Net 402 ml     Physical Exam: Vital Signs Blood pressure 139/79, pulse 99, temperature 98.4 F (36.9 C), resp. rate 17, height 5\' 2"  (1.575 m), weight 58.4 kg, last menstrual period 02/12/2011, SpO2 99 %. Constitutional: Vital signs reviewed. Wearing R prosthesis; wearing limb guard on L BKA, NAD; in manual w/c at bedside watching TV HEENT: EOMI, oral membranes moist Neck: supple Cardiovascular: RRR without murmur. No JVD    Respiratory: CTA Bilaterally without wheezes or rales. Normal effort    GI: BS +, non-tender, non-distended, soft.  Extremities: No clubbing, cyanosis, or edema. Pulses are 2+ Neurological: Patient is alert no acute distress.  Oriented x3 and follows commands; very concrete.   Skin: .  Right BKA is well-healed, moving well.  LLE has limb guard in place L BKA has reduced erythema and redness also less intense than it was. Psych: pleasant       Assessment/Plan: 1. Functional deficits secondary to bilateral BKA which require 3+ hours per day of interdisciplinary therapy in a comprehensive inpatient rehab setting.  Physiatrist is providing close team supervision and 24 hour management of active medical problems listed below.  Physiatrist and rehab team  continue to assess barriers to discharge/monitor patient progress toward functional and medical goals  Care Tool:  Bathing  Bathing activity did not occur: Refused Body parts bathed by patient: Right arm, Left arm, Chest, Abdomen, Front perineal area, Buttocks, Right upper leg, Left upper leg, Face   Body parts bathed by helper: Buttocks, Right upper leg, Left upper leg Body parts n/a: Left lower leg, Right lower leg   Bathing assist Assist Level: Minimal Assistance - Patient > 75%     Upper Body Dressing/Undressing Upper body dressing Upper body dressing/undressing activity did not occur (including orthotics): Refused What is the patient wearing?: Hospital gown only    Upper body assist Assist Level: Set up assist    Lower Body Dressing/Undressing Lower body dressing    Lower body dressing activity did not occur: Refused What is the patient wearing?: Incontinence brief     Lower body assist Assist for lower body dressing: Moderate Assistance - Patient 50 - 74%     Toileting Toileting Toileting Activity did not occur Landscape architect and hygiene only): Refused  Toileting assist Assist for toileting: Minimal Assistance - Patient > 75%     Transfers Chair/bed transfer  Transfers assist     Chair/bed transfer assist level: Contact Guard/Touching assist     Locomotion Ambulation   Ambulation assist   Ambulation activity did not occur: Safety/medical concerns(new L BKA and new R prosthesis, unable to come to a full stance due to  decreased strength/balance)          Walk 10 feet activity   Assist  Walk 10 feet activity did not occur: Safety/medical concerns(new L BKA and new R prosthesis, unable to come to a full stance due to decreased strength/balance)        Walk 50 feet activity   Assist Walk 50 feet with 2 turns activity did not occur: Safety/medical concerns(new L BKA and new R prosthesis, unable to come to a full stance due to decreased  strength/balance)         Walk 150 feet activity   Assist Walk 150 feet activity did not occur: Safety/medical concerns(new L BKA and new R prosthesis, unable to come to a full stand due to decreased strength/balance)         Walk 10 feet on uneven surface  activity   Assist Walk 10 feet on uneven surfaces activity did not occur: Safety/medical concerns(new L BKA and new R prosthesis, unable to come to a full stand due to decreased strength/balance)         Wheelchair     Assist Will patient use wheelchair at discharge?: Yes Type of Wheelchair: Manual    Wheelchair assist level: Set up assist Max wheelchair distance: 170'    Wheelchair 50 feet with 2 turns activity    Assist        Assist Level: Set up assist   Wheelchair 150 feet activity     Assist      Assist Level: Set up assist   Blood pressure 139/79, pulse 99, temperature 98.4 F (36.9 C), resp. rate 17, height 5\' 2"  (1.575 m), weight 58.4 kg, last menstrual period 02/12/2011, SpO2 99 %.  Medical Problem List and Plan: 1.  Decreased functional mobility secondary to left transtibial amputation 09/21/2019 with wound VAC as directed as well as history of right BKA 12/31/2018.  Patient does have a right prosthesis.             -patient may shower but leg must be covered.              -ELOS/Goals: 10-14 days, modI in PT, OT, and I in SLP  1/29- Vascular wants pt on Trental after rehab d/c.- starting NTG patch for increased blood flow and started Keflex 250 mg q6 hours for erythema of BKA.   2/2- Keflex stopped over weekend; con't Bactrim for UTI.  2.  Antithrombotics: -DVT/anticoagulation: Patient has bilateral BKA             -antiplatelet therapy: Aspirin 81 mg daily 3. Pain Management: Oxycodone and Robaxin as needed  1/23 fair control. Discussed massage and sensory feedback today 4. Mood: Provide emotional support             -antipsychotic agents: N/A 5. Neuropsych: This patient is  capable of making decisions on her own behalf. 6. Skin/Wound Care: Routine skin checks 7. Fluids/Electrolytes/Nutrition: Routine in and outs with follow-up chemistries 8.  CKD stage III.  Baseline creatinine 1.21-2.13.  Follow-up Cr 0.94 on 1/20  2/2- Cr up to 1.66 however has CKD stage III and could be affected by Bactrim- will have H/H draw labs and send to PCP.  9.  CAD with CABG October 2019.  Continue aspirin therapy 10.  Acute on chronic diastolic congestive heart failure.  Monitor for any signs of fluid overload   Filed Weights   10/09/19 0500 10/10/19 0526 10/11/19 0505  Weight: 58.1 kg 57.9 kg 58.4 kg  1/26- ordered daily weights  1/27- weight stable.   1/31: stable 11.  History of intracerebral hemorrhage February 2017.  Follow-up outpatient 12.  Diabetes mellitus with peripheral neuropathy.  Hemoglobin A1c 14.1.  SSI.  Patient on Lantus insulin 50 units twice daily prior to admission.    1/23 resume lantus at 5u bid  1/24 some improvement--monitor for pattern   CBG (last 3)  Recent Labs    10/10/19 1638 10/10/19 2103 10/11/19 0608  GLUCAP 127* 237* 110*    1/26- Will start Lantus 5 units QHS since not in computer. Based on BGs 133-355  1/28- bgS 145-218  1/31: elevated range from 177-247 last 5 reads. Will increase Lantus to 6U.   2/1- BGs 150s-190s- will increase to 7 units.  2.2- BGs better since increase in Lantus last night to 7 units.   13.  Hypertension.  Norvasc 5 mg daily. Systolics in last 2 days ranging from 141 to 171. Consider increasing Norvasc to 10mg  daily.   1/20: Will increase Norvasc to 10mg  for uncontrolled BP.  1/21: Continues to be elevated. Will add Hydralazine 10mg  daily.   1/22: BP and HR still slightly elevated. Will increase Hydralazine to 10mg  BID.   1/24 fair control, nausea may be increasing SBP sl  1/25- SBP 150s this AM- better overall- will monitor  1/28- BP 136/113  1/30: BP is uncontrolled this morning to 123456 systolic. It appears  to have worse control in the mornings and better control at nights. Will maintain current dose of BP medications and I do not want her to become hypotensive in afternoons/evenings.  1/31: continues to be labile   2/1- BP today is great 120s/70s 14.  Hyperlipidemia.  Lipitor 15.  Constipation: Docusate 100mg  BID. Has not yet had BM. Consider changing to Senna-docusate BID.   1/20: Still constipated, will add daily Miralax  1/23: had a bm on 1/20, small   -still refusing meds   -continued education by staff, encourage use of softeners/laxative  1/24: discussed with pt today. She realizes she needs to get bowels going   -sorbitol today, SSE if needed  1/25- 3 BMs documented in last 24 hours- hasn't changed nausea in AM  1/28- 2 BMs 1/27  2/1- c/o diarrhea x1 episode- since not going regularly, think won't decrease bowel meds.  16. Itching:  hydroxyzine prn.  17. Check U/A and Cx per pt request- urine smell bad- possibly having some stinging with urination, sent UA and Cx  1/29- Nitrite (+)_ already placed on keflex 250 mg q6 hours by Vascular for BKA, so will wait for Cx to see if need additional ABX.   1/30: Communicated with pharmacy this morning regarding UC growing Citrobacter Freundii .Started Bactrim DS BID for 7 days as organism is resistant to Keflex.    LOS: 14 days A FACE TO FACE EVALUATION WAS PERFORMED  Megan Lovorn 10/11/2019, 9:05 AM

## 2019-10-12 ENCOUNTER — Inpatient Hospital Stay (HOSPITAL_COMMUNITY): Payer: BC Managed Care – PPO

## 2019-10-12 LAB — GLUCOSE, CAPILLARY
Glucose-Capillary: 126 mg/dL — ABNORMAL HIGH (ref 70–99)
Glucose-Capillary: 132 mg/dL — ABNORMAL HIGH (ref 70–99)

## 2019-10-12 NOTE — Progress Notes (Signed)
Patient was discharged home with family friend, all belongings packed and brought down with patient. PT assisted with car transfer and provided education.

## 2019-10-12 NOTE — Progress Notes (Signed)
Physical Therapy Session Note  Patient Details  Name: [name] MRN: [id number] Date of Birth: [birth year]  Today's Date: 10/12/2019 PT Individual Time: 1600-1620 20 min  Short Term Goals: Week 2:  PT Short Term Goal 1 (Week 2): STG=LTG due to short ELOS.  Skilled Therapeutic Interventions/Progress Updates:  Patient in the w/c in the room upon PT arrival. She donned her R prostheses independently. Her L shrinker an dlimb guard were donned prior to session. Focused session on d/c education and car tranfer training into personal vehicle. Assisted patient with preparation for d/c. Transported patient to main entrance with total A in w/c with all belongings from her room. A family friend very familiar to the patient was out front waiting to pick up the patient. Educated the family friend on importance of training family on car transfers, he stated that he would be the one driving her most places and would teach other family members at home. Assisted patient with car transfer into the car using the slide board, demonstrating correct board placement and safe assist technique and cuing patient to protect her L residual limb and on hand placement. Provided education about protecting her skin on her R residual limb and instructed the patient to leave her prosthesis off unless transferring until her skin was no long red, patient and friend in agreement and stated understanding. Recommended the patient call her CPO at [facility] to discuss further education on wearing her prosthesis, patient in agreement. Educated patient and her friend on transfer out of the car, both stated understanding. The patient declined praticing the transfer out of the car and back in with her friend. Patient in her friend's personal vehicle and their care for d/c at end of session.  Therapy Documentation Precautions:  Precautions Precautions: Fall Precaution Comments: wound vac Required Braces or Orthoses: (residual limb  protector for LLE) Restrictions Weight Bearing Restrictions: Yes LLE Weight Bearing: Non weight bearing Other Position/Activity Restrictions: limb guard LLE    Therapy/Group: Individual Therapy  Tripp Goins L Jesper Stirewalt PT, DPT  10/12/2019, 7:55 AM

## 2019-10-12 NOTE — Progress Notes (Signed)
[place] PHYSICAL MEDICINE & REHABILITATION PROGRESS NOTE   Subjective/Complaints:  Has swelling, erythema, rash appearance of RLE from wearing prosthesis too many hours in last few days- >12 hrs/day.  She reports sh'e scared to go home without using prosthesis to transfer- explained she can't wear prothesis today because skin is red and appears to have impending skin breakdown from wearing prosthesis too long last few days. We went in circles because pt kept saying cannot go without R leg, but has to because wore for too many hours- finally, told nursing cannot wear today-    ROS: Patient denies fever, rash, sore throat, blurred vision, cough, shortness of breath or chest pain,  back pain, headache, or mood change.   Objective:  No results found. Recent Labs    10/11/19 0528  WBC 8.3  HGB 9.5*  HCT 29.8*  PLT 654*   Recent Labs    10/11/19 0528  NA 139  K 4.2  CL 102  CO2 25  GLUCOSE 86  BUN 20  CREATININE 1.66*  CALCIUM 8.8*    Intake/Output Summary (Last 24 hours) at 10/12/2019 0850 Last data filed at 10/11/2019 1852 Gross per 24 hour  Intake 458 ml  Output --  Net 458 ml     Physical Exam: Vital Signs Blood pressure (!) 149/82, pulse 97, temperature 98.4 F (36.9 C), temperature source Oral, resp. rate 17, height 5\' 2"  (1.575 m), weight 60 kg, last menstrual period 02/12/2011, SpO2 99 %. Constitutional: Vital signs reviewed. Has R BKA up on lap- raised erythematous skin- like impending skin breakdown in R BKA if wears prosthesis even more than the >12 hrs she already has, NAD HEENT: EOMI, oral membranes moist Neck: supple Cardiovascular: RRR without murmur. No JVD    Respiratory: CTA Bilaterally without wheezes or rales. Normal effort    GI: BS +, non-tender, non-distended, soft.  Extremities: No clubbing, cyanosis, or edema. Pulses are 2+ Neurological: Patient is alert no acute distress.  Oriented x3 and follows commands; very concrete.   Skin: .  Right BKA  is well-healed, moving well but skin this AM was red, raised, erythematous- like has developing rash, impending skin breakdown.  LLE has limb guard in place L BKA has reduced erythema and redness also less intense than it was. Psych: pleasant       Assessment/Plan: 1. Functional deficits secondary to bilateral BKA which require 3+ hours per day of interdisciplinary therapy in a comprehensive inpatient rehab setting.  Physiatrist is providing close team supervision and 24 hour management of active medical problems listed below.  Physiatrist and rehab team continue to assess barriers to discharge/monitor patient progress toward functional and medical goals  Care Tool:  Bathing  Bathing activity did not occur: Refused Body parts bathed by patient: Right arm, Left arm, Chest, Abdomen, Front perineal area, Buttocks, Right upper leg, Left upper leg, Face   Body parts bathed by helper: Buttocks, Right upper leg, Left upper leg Body parts n/a: Left lower leg, Right lower leg   Bathing assist Assist Level: Minimal Assistance - Patient > 75%     Upper Body Dressing/Undressing Upper body dressing Upper body dressing/undressing activity did not occur (including orthotics): Refused What is the patient wearing?: Pull over shirt    Upper body assist Assist Level: Set up assist    Lower Body Dressing/Undressing Lower body dressing    Lower body dressing activity did not occur: Refused What is the patient wearing?: Underwear/pull up     Lower body  assist Assist for lower body dressing: Moderate Assistance - Patient 50 - 74%     Toileting Toileting Toileting Activity did not occur Landscape architect and hygiene only): Refused  Toileting assist Assist for toileting: Moderate Assistance - Patient 50 - 74%     Transfers Chair/bed transfer  Transfers assist     Chair/bed transfer assist level: Minimal Assistance - Patient > 75%     Locomotion Ambulation   Ambulation  assist   Ambulation activity did not occur: Safety/medical concerns(Decreased strength/balance with new L BKA and new R prosthesis for BKA)          Walk 10 feet activity   Assist  Walk 10 feet activity did not occur: Safety/medical concerns(Decreased strength/balance with new L BKA and new R prosthesis for BKA)        Walk 50 feet activity   Assist Walk 50 feet with 2 turns activity did not occur: Safety/medical concerns(Decreased strength/balance with new L BKA and new R prosthesis for BKA)         Walk 150 feet activity   Assist Walk 150 feet activity did not occur: Safety/medical concerns(Decreased strength/balance with new L BKA and new R prosthesis for BKA)         Walk 10 feet on uneven surface  activity   Assist Walk 10 feet on uneven surfaces activity did not occur: Safety/medical concerns(Decreased strength/balance with new L BKA and new R prosthesis for BKA)         Wheelchair     Assist Will patient use wheelchair at discharge?: Yes Type of Wheelchair: Manual    Wheelchair assist level: Set up assist Max wheelchair distance: >200 ft    Wheelchair 50 feet with 2 turns activity    Assist        Assist Level: Set up assist   Wheelchair 150 feet activity     Assist      Assist Level: Set up assist   Blood pressure (!) 149/82, pulse 97, temperature 98.4 F (36.9 C), temperature source Oral, resp. rate 17, height 5\' 2"  (1.575 m), weight 60 kg, last menstrual period 02/12/2011, SpO2 99 %.  Medical Problem List and Plan: 1.  Decreased functional mobility secondary to left transtibial amputation 09/21/2019 with wound VAC as directed as well as history of right BKA 12/31/2018.  Patient does have a right prosthesis.             -patient may shower but leg must be covered.              -ELOS/Goals: 10-14 days, modI in PT, OT, and I in SLP  1/29- Vascular wants pt on Trental after rehab d/c.- starting NTG patch for increased blood  flow and started Keflex 250 mg q6 hours for erythema of BKA.   2/2- Keflex stopped over weekend; con't Bactrim for UTI.   2/3- Ortho was called to assess pt's L BKA- don't see note prior to d/c.  2.  Antithrombotics: -DVT/anticoagulation: Patient has bilateral BKA             -antiplatelet therapy: Aspirin 81 mg daily 3. Pain Management: Oxycodone and Robaxin as needed  1/23 fair control. Discussed massage and sensory feedback today 4. Mood: Provide emotional support             -antipsychotic agents: N/A 5. Neuropsych: This patient is capable of making decisions on her own behalf. 6. Skin/Wound Care: Routine skin checks 7. Fluids/Electrolytes/Nutrition: Routine in and outs with follow-up chemistries  8.  CKD stage III.  Baseline creatinine 1.21-2.13.  Follow-up Cr 0.94 on 1/20  2/2- Cr up to 1.66 however has CKD stage III and could be affected by Bactrim- will have H/H draw labs and send to PCP.  9.  CAD with CABG October 2019.  Continue aspirin therapy 10.  Acute on chronic diastolic congestive heart failure.  Monitor for any signs of fluid overload   Filed Weights   10/10/19 0526 10/11/19 0505 10/12/19 0246  Weight: 57.9 kg 58.4 kg 60 kg    1/26- ordered daily weights  1/27- weight stable.   1/31: stable 11.  History of intracerebral hemorrhage February 2017.  Follow-up outpatient 12.  Diabetes mellitus with peripheral neuropathy.  Hemoglobin A1c 14.1.  SSI.  Patient on Lantus insulin 50 units twice daily prior to admission.    1/23 resume lantus at 5u bid  1/24 some improvement--monitor for pattern   CBG (last 3)  Recent Labs    10/11/19 1637 10/11/19 2101 10/12/19 0641  GLUCAP 169* 169* 126*    1/26- Will start Lantus 5 units QHS since not in computer. Based on BGs 133-355  1/28- bgS 145-218  1/31: elevated range from 177-247 last 5 reads. Will increase Lantus to 6U.   2/1- BGs 150s-190s- will increase to 7 units.  2.2- BGs better since increase in Lantus last night to 7  units.  2/3- BGs 125-169- better at 7 units= was on 50 nits BID at home- will see which she needs.    13.  Hypertension.  Norvasc 5 mg daily. Systolics in last 2 days ranging from 141 to 171. Consider increasing Norvasc to 10mg  daily.   1/20: Will increase Norvasc to 10mg  for uncontrolled BP.  1/21: Continues to be elevated. Will add Hydralazine 10mg  daily.   1/22: BP and HR still slightly elevated. Will increase Hydralazine to 10mg  BID.   1/24 fair control, nausea may be increasing SBP sl  1/25- SBP 150s this AM- better overall- will monitor  1/28- BP 136/113  1/30: BP is uncontrolled this morning to [id number] systolic. It appears to have worse control in the mornings and better control at nights. Will maintain current dose of BP medications and I do not want her to become hypotensive in afternoons/evenings.  1/31: continues to be labile   2/1- BP today is great 120s/70s 14.  Hyperlipidemia.  Lipitor 15.  Constipation: Docusate 100mg  BID. Has not yet had BM. Consider changing to Senna-docusate BID.   1/20: Still constipated, will add daily Miralax  1/23: had a bm on 1/20, small   -still refusing meds   -continued education by staff, encourage use of softeners/laxative  1/24: discussed with pt today. She realizes she needs to get bowels going   -sorbitol today, SSE if needed  1/25- 3 BMs documented in last 24 hours- hasn't changed nausea in AM  1/28- 2 BMs 1/27  2/1- c/o diarrhea x1 episode- since not going regularly, think won't decrease bowel meds.  16. Itching:  hydroxyzine prn.  17. Check U/A and Cx per pt request- urine smell bad- possibly having some stinging with urination, sent UA and Cx  1/29- Nitrite (+)_ already placed on keflex 250 mg q6 hours by Vascular for BKA, so will wait for Cx to see if need additional ABX.   1/30: Communicated with pharmacy this morning regarding UC growing Citrobacter Freundii .Started Bactrim DS BID for 7 days as organism is resistant to Keflex.     LOS: 15 days A  FACE TO FACE EVALUATION WAS PERFORMED  [name] 10/12/2019, 8:50 AM

## 2019-10-14 ENCOUNTER — Telehealth: Payer: Self-pay | Admitting: Registered Nurse

## 2019-10-14 NOTE — Telephone Encounter (Signed)
Placed a call to Ms. Holohan and her son . Her son voice mail was full, awaiting a return call.

## 2019-10-17 ENCOUNTER — Other Ambulatory Visit: Payer: Self-pay

## 2019-10-17 ENCOUNTER — Ambulatory Visit: Payer: BC Managed Care – PPO | Admitting: Orthopedic Surgery

## 2019-10-17 ENCOUNTER — Encounter: Payer: Self-pay | Admitting: Orthopedic Surgery

## 2019-10-17 VITALS — Ht 62.0 in | Wt 132.0 lb

## 2019-10-17 DIAGNOSIS — Z89511 Acquired absence of right leg below knee: Secondary | ICD-10-CM

## 2019-10-17 DIAGNOSIS — Z89512 Acquired absence of left leg below knee: Secondary | ICD-10-CM

## 2019-10-17 MED ORDER — OXYCODONE HCL 5 MG PO TABS: 5.0000 mg | ORAL_TABLET | ORAL | 0 refills | Status: DC | PRN

## 2019-10-17 NOTE — Progress Notes (Signed)
Office Visit Note   Patient: [name]           Date of Birth: Jun 12, 1966           MRN: [id number] Visit Date: 10/17/2019              Requested by: Mosie Lukes, MD [address] [place],  [place] [address] PCP: Mosie Lukes, MD  Chief Complaint  Patient presents with  . Left Leg - Routine Post Op    09/21/19 left BKA       HPI: Patient is a 54 year old woman who presents 4 weeks status post left transtibial amputation.  Patient states she is developing a possible blister on the medial aspect right patella right transtibial amputation currently wearing a prosthesis on the right.  Assessment & Plan: Visit Diagnoses:  1. Acquired absence of right leg below knee ([place])   2. Acquired absence of left leg below knee [facility])     Plan: Patient will follow up with [facility] to evaluate for her prosthetic fitting on the right a prescription was called in for oxycodone.  Patient will follow up in 2 weeks to remove the sutures and staples.  Follow-Up Instructions: Return in about 2 weeks (around 10/31/2019).   Ortho Exam  Patient is alert, oriented, no adenopathy, well-dressed, normal affect, normal respiratory effort. Examination patient has some partial-thickness eschar over the incision left transtibial amputation.  There is no cellulitis no odor no drainage no signs of infection.  Patient states she does not want the staples and sutures removed today she is not taken a pain pill this morning.  Imaging: No results found. No images are attached to the encounter.  Labs: Lab Results  Component Value Date   HGBA1C 14.1 (H) 09/21/2019   HGBA1C 10.5 (H) 12/12/2018   HGBA1C 11.1 (H) 06/26/2018   ESRSEDRATE 130 (H) 12/12/2018   ESRSEDRATE 115 (H) 06/24/2018   ESRSEDRATE 107 (H) 09/04/2016   CRP 28.2 (H) 12/12/2018   CRP 7.2 (H) 09/04/2016   CRP 0.7 03/21/2015   LABURIC 3.2 06/13/2014   LABURIC 3.1 02/09/2014   REPTSTATUS 10/08/2019 FINAL 10/06/2019   GRAMSTAIN  12/13/2018    FEW WBC PRESENT,BOTH PMN AND MONONUCLEAR NO ORGANISMS SEEN    CULT >=100,000 COLONIES/mL CITROBACTER FREUNDII (A) 10/06/2019   LABORGA CITROBACTER FREUNDII (A) 10/06/2019     Lab Results  Component Value Date   ALBUMIN 2.2 (L) 10/11/2019   ALBUMIN 2.1 (L) 09/28/2019   ALBUMIN 1.8 (L) 01/06/2019   PREALBUMIN 12.7 (L) 09/04/2016   LABURIC 3.2 06/13/2014   LABURIC 3.1 02/09/2014    Lab Results  Component Value Date   MG 2.1 12/16/2018   MG 2.8 (H) 06/29/2018   MG 3.0 (H) 06/29/2018   No results found for: VD25OH  Lab Results  Component Value Date   PREALBUMIN 12.7 (L) 09/04/2016   CBC EXTENDED Latest Ref Rng & Units 10/11/2019 09/28/2019 09/21/2019  WBC 4.0 - 10.5 K/uL 8.3 9.6 10.4  RBC 3.87 - 5.11 MIL/uL 3.52(L) 3.98 4.25  HGB 12.0 - 15.0 g/dL 9.5(L) 10.7(L) 11.7(L)  HCT 36.0 - 46.0 % 29.8(L) 34.4(L) 37.1  PLT 150 - 400 K/uL 654(H) 640(H) 755(H)  NEUTROABS 1.7 - 7.7 K/uL 4.5 7.1 -  LYMPHSABS 0.7 - 4.0 K/uL 2.5 1.4 -     Body mass index is 24.14 kg/m.  Orders:  No orders of the defined types were placed in this encounter.  Meds ordered this encounter  Medications  . oxyCODONE (OXY IR/ROXICODONE) 5 MG immediate release tablet    Sig: Take 1-2 tablets (5-10 mg total) by mouth every 4 (four) hours as needed for moderate pain (pain score 4-6).    Dispense:  30 tablet    Refill:  0     Procedures: No procedures performed  Clinical Data: No additional findings.  ROS:  All other systems negative, except as noted in the HPI. Review of Systems  Objective: Vital Signs: Ht 5\' 2"  (1.575 m)   Wt 132 lb (59.9 kg)   LMP 02/12/2011 (Exact Date)   BMI 24.14 kg/m   Specialty Comments:  No specialty comments available.  PMFS History: Patient Active Problem List   Diagnosis Date Noted  . Adjustment disorder with mixed anxiety and depressed mood   . S/P bilateral BKA (below knee amputation) ([place]) 09/27/2019  . Acute osteomyelitis of left  foot ([place]) 09/21/2019  . Subacute osteomyelitis, left ankle and foot ([place])   . Pressure injury of left heel, unstageable ([place]) 02/09/2019  . Hypoglycemia   . Diabetes mellitus type 2 in nonobese (H[facility])   . Labile blood pressure   . Labile blood glucose   . Phantom limb pain ([place])   . CKD (chronic kidney disease), stage III   . Hypothyroidism   . Essential hypertension   . Chronic diastolic congestive heart failure ([place])   . Coronary artery disease involving coronary bypass graft of native heart without angina pectoris   . Poorly controlled type 2 diabetes mellitus with peripheral neuropathy ([place])   . Acute on chronic anemia   . History of below knee amputation, right ([place]) 12/31/2018  . S/P CABG x 3 06/28/2018  . Coronary artery disease involving native coronary artery of native heart with angina pectoris ([place]) 06/25/2018  . Type 2 diabetes mellitus with vascular disease ([place]) 06/24/2018  . Acute on chronic diastolic CHF (congestive heart failure) ([place]) 06/24/2018  . Non-ST elevation (NSTEMI) myocardial infarction ([place]) 06/24/2018  . Chest pain 06/23/2018  . Glaucoma 09/17/2017  . Diabetic retinopathy ([place]) 09/17/2017  . Otitis media 11/10/2016  . Decreased visual acuity 11/10/2016  . Spinal stenosis, lumbar region with neurogenic claudication 10/22/2016  . Other spondylosis with radiculopathy, lumbar region 10/13/2016  . Lipoma of abdominal wall 10/05/2016  . Severe protein-calorie malnutrition ([place]) 09/04/2016  . Anemia of chronic disease 09/04/2016  . Diabetic polyneuropathy associated with type 2 diabetes mellitus ([place]) 05/07/2016  . Orthostatic hypotension 05/07/2016  . Cerebrovascular disease 04/27/2016  . N&V (nausea and vomiting) 04/01/2016  . ICH (intracerebral hemorrhage) ([place]) 03/30/2016  . Visit for preventive health examination 05/21/2015  . Breast cancer screening 05/21/2015  . Arm lesion 07/02/2014  . Pain in limb 01/13/2014  . Neuropathic pain of both legs  01/13/2014  . Hip pain 01/12/2014  . Allergic rhinitis 03/10/2013  . Back pain 03/08/2013  . Depression 12/18/2012  . PVD (peripheral vascular disease) ([place]) 01/21/2012  . Hyperlipidemia 12/06/2010  . Overweight(278.02) 12/06/2010  . RESTLESS LEG SYNDROME 10/25/2010  . Migraine without aura 10/07/2010  . Hereditary and idiopathic peripheral neuropathy 10/07/2010  . Essential hypertension, benign 10/07/2010  . DISTURBANCE OF SKIN SENSATION 10/07/2010  . HEART MURMUR, HX OF 10/07/2010  . NEPHROLITHIASIS, HX OF 10/07/2010   Past Medical History:  Diagnosis Date  . Abscess of great toe, right   . Acute on chronic diastolic CHF (congestive heart failure) ([place]) 06/24/2018  . Acute osteomyelitis of toe, left ([place]) 09/05/2016  . Amputation of right great toe (  [place]) 12/22/2018  . Anxiety   . Cataract    left - surgery to remove  . Cellulitis of foot, right 12/11/2018  . COMMON MIGRAINE 10/07/2010  . Decreased visual acuity 11/10/2016  . Depression 12/18/2012  . Diabetes mellitus type II, uncontrolled ([place]) 10/07/2010   Qualifier: Diagnosis of  By: Charlett Blake MD, [name]    . Diabetic foot infection ([name]) 08/26/2016  . Diabetic infection of right foot ([place])   . Disturbance of skin sensation 10/07/2010  . Gangrene of right foot ([place])   . GERD (gastroesophageal reflux disease)   . Heart murmur   . History of kidney stones    "years ago" - passed stones  . Hyperlipidemia 12/06/2010  . Hypertension   . Hypothyroidism   . Lipoma of abdominal wall 10/05/2016  . Overweight(278.02) 12/06/2010  . PERIPHERAL NEUROPATHY, FEET 10/07/2010  . PVD (peripheral vascular disease) ([place]) 01/21/2012  . RESTLESS LEG SYNDROME 10/25/2010  . Stroke [facility]) 2014, 2017   most recently in 2/17 - intracerebral hemorrhage    Family History  Problem Relation Age of Onset  . Arthritis Mother   . Stroke Brother        previous smoker  . Alcohol abuse Brother        in remission  . Leukemia Brother   . Diabetes Paternal  Grandmother   . Healthy Son     Past Surgical History:  Procedure Laterality Date  . AMPUTATION Right 12/13/2018   Procedure: AMPUTATION RIGHT GREAT TOE, LOCAL RELOCATION OF TISSUE FOR WOUND CLOSURE 9cm x 3cm, VAC APPLICATION;  Surgeon: [name], MD;  Location: [place];  Service: Orthopedics;  Laterality: Right;  . AMPUTATION Right 12/31/2018   Procedure: RIGHT BELOW KNEE AMPUTATION;  Surgeon: [name], MD;  Location: [place];  Service: Orthopedics;  Laterality: Right;  . AMPUTATION Left 09/21/2019   Procedure: LEFT BELOW KNEE AMPUTATION;  Surgeon: [name], MD;  Location: [place];  Service: Orthopedics;  Laterality: Left;  . AMPUTATION TOE Left 09/09/2016   Procedure: AMPUTATION OF LEFT GREAT TOE;  Surgeon: Milly Jakob, MD;  Location: [place];  Service: Orthopedics;  Laterality: Left;  . BELOW KNEE LEG AMPUTATION Left 09/21/2019  . CARDIAC CATHETERIZATION  06/24/2018  . CORONARY ARTERY BYPASS GRAFT N/A 06/28/2018   Procedure: CORONARY ARTERY BYPASS GRAFTING (CABG) times  four, using left internal mammary artery, endoscopically harvested right saphenous vein, and harvested left radial artery;  Surgeon: [name], MD;  Location: [place];  Service: Open Heart Surgery;  Laterality: N/A;  . ENDOVEIN HARVEST OF GREATER SAPHENOUS VEIN Right 06/28/2018   Procedure: ENDOVEIN HARVEST OF GREATER SAPHENOUS VEIN;  Surgeon: [name], MD;  Location: [place];  Service: Open Heart Surgery;  Laterality: Right;  . EYE SURGERY Left 06/2017   [facility]  . LEFT HEART CATH AND CORONARY ANGIOGRAPHY N/A 06/24/2018   Procedure: LEFT HEART CATH AND CORONARY ANGIOGRAPHY;  Surgeon: Troy Sine, MD;  Location: [place] CV LAB;  Service: Cardiovascular;  Laterality: N/A;  . RADIAL ARTERY HARVEST Left 06/28/2018   Procedure: RADIAL ARTERY HARVEST;  Surgeon: [name], MD;  Location: [place];  Service: Open Heart Surgery;  Laterality: Left;  . TEE WITHOUT  CARDIOVERSION N/A 06/28/2018   Procedure: TRANSESOPHAGEAL ECHOCARDIOGRAM (TEE);  Surgeon: [name], MD;  Location: [name];  Service: Open Heart Surgery;  Laterality: N/A;  . WISDOM TOOTH EXTRACTION     Social History   Occupational History  .  Occupation: Glass blower/designer  Tobacco Use  . Smoking status: Former Smoker    Packs/day: 0.50    Quit date: 09/08/2005    Years since quitting: 14.1  . Smokeless tobacco: Never Used  Substance and Sexual Activity  . Alcohol use: No    Alcohol/week: 0.0 standard drinks  . Drug use: Not Currently  . Sexual activity: Yes    Partners: Male    Birth control/protection: Post-menopausal

## 2019-10-25 ENCOUNTER — Encounter: Payer: BC Managed Care – PPO | Admitting: Physical Medicine and Rehabilitation

## 2019-10-25 ENCOUNTER — Other Ambulatory Visit: Payer: Self-pay | Admitting: Orthopedic Surgery

## 2019-10-25 ENCOUNTER — Telehealth: Payer: Self-pay | Admitting: Family Medicine

## 2019-10-25 DIAGNOSIS — I5032 Chronic diastolic (congestive) heart failure: Secondary | ICD-10-CM | POA: Diagnosis not present

## 2019-10-25 DIAGNOSIS — M48062 Spinal stenosis, lumbar region with neurogenic claudication: Secondary | ICD-10-CM | POA: Diagnosis not present

## 2019-10-25 DIAGNOSIS — D631 Anemia in chronic kidney disease: Secondary | ICD-10-CM | POA: Diagnosis not present

## 2019-10-25 DIAGNOSIS — E039 Hypothyroidism, unspecified: Secondary | ICD-10-CM | POA: Diagnosis not present

## 2019-10-25 DIAGNOSIS — E1142 Type 2 diabetes mellitus with diabetic polyneuropathy: Secondary | ICD-10-CM | POA: Diagnosis not present

## 2019-10-25 DIAGNOSIS — E1122 Type 2 diabetes mellitus with diabetic chronic kidney disease: Secondary | ICD-10-CM | POA: Diagnosis not present

## 2019-10-25 DIAGNOSIS — I2510 Atherosclerotic heart disease of native coronary artery without angina pectoris: Secondary | ICD-10-CM | POA: Diagnosis not present

## 2019-10-25 DIAGNOSIS — E1151 Type 2 diabetes mellitus with diabetic peripheral angiopathy without gangrene: Secondary | ICD-10-CM | POA: Diagnosis not present

## 2019-10-25 DIAGNOSIS — M4726 Other spondylosis with radiculopathy, lumbar region: Secondary | ICD-10-CM | POA: Diagnosis not present

## 2019-10-25 DIAGNOSIS — E11319 Type 2 diabetes mellitus with unspecified diabetic retinopathy without macular edema: Secondary | ICD-10-CM | POA: Diagnosis not present

## 2019-10-25 DIAGNOSIS — N183 Chronic kidney disease, stage 3 unspecified: Secondary | ICD-10-CM | POA: Diagnosis not present

## 2019-10-25 DIAGNOSIS — Z4781 Encounter for orthopedic aftercare following surgical amputation: Secondary | ICD-10-CM | POA: Diagnosis not present

## 2019-10-25 DIAGNOSIS — E785 Hyperlipidemia, unspecified: Secondary | ICD-10-CM | POA: Diagnosis not present

## 2019-10-25 DIAGNOSIS — G546 Phantom limb syndrome with pain: Secondary | ICD-10-CM | POA: Diagnosis not present

## 2019-10-25 DIAGNOSIS — F329 Major depressive disorder, single episode, unspecified: Secondary | ICD-10-CM | POA: Diagnosis not present

## 2019-10-25 DIAGNOSIS — I130 Hypertensive heart and chronic kidney disease with heart failure and stage 1 through stage 4 chronic kidney disease, or unspecified chronic kidney disease: Secondary | ICD-10-CM | POA: Diagnosis not present

## 2019-10-25 NOTE — Telephone Encounter (Signed)
Recent BKA does this pt need a refill on her doxy?

## 2019-10-25 NOTE — Telephone Encounter (Signed)
Caller/Agency: Malachy Mood w/[facility] Callback Number: [phone] Requesting OT/PT/Skilled Nursing/Social Work/Speech Therapy: wound care Frequency: Pt has small superficial wound on bottom of right stump (below knee). Pt has silver sulfadiazine cream at the home. Requesting VO to approve using the silver sulfadiazine and foam pad for treatment.

## 2019-10-26 ENCOUNTER — Ambulatory Visit: Payer: BC Managed Care – PPO | Admitting: Family

## 2019-10-26 NOTE — Telephone Encounter (Signed)
OK to give verbal order for Silvadene and wound care. I approve PT/OT/speech and social work through home health but I do think she will need a video visit with me for me to order since I have not seen her in more than 30 days. I think we have the next couple weeks to get it done. Home health should know check with them.

## 2019-10-26 NOTE — Telephone Encounter (Signed)
SB-Plz see req from [facility]/plz advise/thx dmf

## 2019-10-27 NOTE — Telephone Encounter (Signed)
Verbal orders given only for wound care pt and ot, patient does not needs home health, or speech

## 2019-10-28 ENCOUNTER — Telehealth: Payer: Self-pay | Admitting: Orthopedic Surgery

## 2019-10-28 DIAGNOSIS — M48062 Spinal stenosis, lumbar region with neurogenic claudication: Secondary | ICD-10-CM | POA: Diagnosis not present

## 2019-10-28 DIAGNOSIS — E1151 Type 2 diabetes mellitus with diabetic peripheral angiopathy without gangrene: Secondary | ICD-10-CM | POA: Diagnosis not present

## 2019-10-28 DIAGNOSIS — I5032 Chronic diastolic (congestive) heart failure: Secondary | ICD-10-CM | POA: Diagnosis not present

## 2019-10-28 DIAGNOSIS — D631 Anemia in chronic kidney disease: Secondary | ICD-10-CM | POA: Diagnosis not present

## 2019-10-28 DIAGNOSIS — E1142 Type 2 diabetes mellitus with diabetic polyneuropathy: Secondary | ICD-10-CM | POA: Diagnosis not present

## 2019-10-28 DIAGNOSIS — G546 Phantom limb syndrome with pain: Secondary | ICD-10-CM | POA: Diagnosis not present

## 2019-10-28 DIAGNOSIS — Z4781 Encounter for orthopedic aftercare following surgical amputation: Secondary | ICD-10-CM | POA: Diagnosis not present

## 2019-10-28 DIAGNOSIS — E039 Hypothyroidism, unspecified: Secondary | ICD-10-CM | POA: Diagnosis not present

## 2019-10-28 DIAGNOSIS — E785 Hyperlipidemia, unspecified: Secondary | ICD-10-CM | POA: Diagnosis not present

## 2019-10-28 DIAGNOSIS — E11319 Type 2 diabetes mellitus with unspecified diabetic retinopathy without macular edema: Secondary | ICD-10-CM | POA: Diagnosis not present

## 2019-10-28 DIAGNOSIS — I130 Hypertensive heart and chronic kidney disease with heart failure and stage 1 through stage 4 chronic kidney disease, or unspecified chronic kidney disease: Secondary | ICD-10-CM | POA: Diagnosis not present

## 2019-10-28 DIAGNOSIS — F329 Major depressive disorder, single episode, unspecified: Secondary | ICD-10-CM | POA: Diagnosis not present

## 2019-10-28 DIAGNOSIS — E1122 Type 2 diabetes mellitus with diabetic chronic kidney disease: Secondary | ICD-10-CM | POA: Diagnosis not present

## 2019-10-28 DIAGNOSIS — M4726 Other spondylosis with radiculopathy, lumbar region: Secondary | ICD-10-CM | POA: Diagnosis not present

## 2019-10-28 DIAGNOSIS — I2510 Atherosclerotic heart disease of native coronary artery without angina pectoris: Secondary | ICD-10-CM | POA: Diagnosis not present

## 2019-10-28 DIAGNOSIS — N183 Chronic kidney disease, stage 3 unspecified: Secondary | ICD-10-CM | POA: Diagnosis not present

## 2019-10-28 NOTE — Telephone Encounter (Signed)
Called and sw HHN advised ok for PT orders as requested below.

## 2019-10-28 NOTE — Telephone Encounter (Signed)
Mickel Baas from [facility] called requesting verbal orders. PT 2x a week for 4 weeks. Once a week for 3 weeks. Patient will be looked at for wound area surgeon appointment. Leave message on [place] if needed. Mickel Baas phone number is [phone].

## 2019-10-31 ENCOUNTER — Ambulatory Visit (INDEPENDENT_AMBULATORY_CARE_PROVIDER_SITE_OTHER): Payer: BC Managed Care – PPO | Admitting: Orthopedic Surgery

## 2019-10-31 ENCOUNTER — Encounter: Payer: Self-pay | Admitting: Orthopedic Surgery

## 2019-10-31 ENCOUNTER — Telehealth: Payer: Self-pay | Admitting: Orthopedic Surgery

## 2019-10-31 ENCOUNTER — Other Ambulatory Visit: Payer: Self-pay

## 2019-10-31 VITALS — Ht 62.0 in | Wt 132.0 lb

## 2019-10-31 DIAGNOSIS — Z89511 Acquired absence of right leg below knee: Secondary | ICD-10-CM

## 2019-10-31 DIAGNOSIS — Z89512 Acquired absence of left leg below knee: Secondary | ICD-10-CM

## 2019-10-31 NOTE — Telephone Encounter (Signed)
Patient called.   She wanted to know if two of her stitches were left in intentionally.   Call back number: [phone]

## 2019-10-31 NOTE — Progress Notes (Signed)
Office Visit Note   Patient: [name]           Date of Birth: February 14, 1966           MRN: [id number] Visit Date: 10/31/2019              Requested by: Mosie Lukes, MD [address] [place],  [place] [address] PCP: Mosie Lukes, MD  Chief Complaint  Patient presents with  . Left Leg - Routine Post Op    09/21/19 left BKA       HPI: Patient is a 54 year old woman who presents about 5 weeks status post left transtibial amputation she is also status post a right transtibial amputation.  Patient states she is developing some new ulcers on her right BKA.  Assessment & Plan: Visit Diagnoses:  1. Acquired absence of right leg below knee ([place])   2. Acquired absence of left leg below knee [facility])     Plan: Patient will go to [facility] for socket adjustment to unload pressure from the 2 ulcerative areas on the right.  We will continue with the compression stocking for the left her ischemic eschar appears superficial.  Follow-Up Instructions: Return in about 4 weeks (around 11/28/2019).   Ortho Exam  Patient is alert, oriented, no adenopathy, well-dressed, normal affect, normal respiratory effort. Examination there is black eschar around the incision on the left.  The largest area of black eschar laterally appears superficial.  There is no cellulitis no odor no drainage no signs of infection Staples harvested today.  Examination the right transtibial amputation she has an end bearing ulcer approximately 2 cm in diameter there is not full-thickness and an ulcer over the fibular head which is also not full-thickness is 3 cm in diameter.  Patient is subsiding into her socket and will send her to [facility] for socket modification.  Imaging: No results found. No images are attached to the encounter.  Labs: Lab Results  Component Value Date   HGBA1C 14.1 (H) 09/21/2019   HGBA1C 10.5 (H) 12/12/2018   HGBA1C 11.1 (H) 06/26/2018   ESRSEDRATE 130 (H) 12/12/2018   ESRSEDRATE 115 (H) 06/24/2018   ESRSEDRATE 107 (H) 09/04/2016   CRP 28.2 (H) 12/12/2018   CRP 7.2 (H) 09/04/2016   CRP 0.7 03/21/2015   LABURIC 3.2 06/13/2014   LABURIC 3.1 02/09/2014   REPTSTATUS 10/08/2019 FINAL 10/06/2019   GRAMSTAIN  12/13/2018    FEW WBC PRESENT,BOTH PMN AND MONONUCLEAR NO ORGANISMS SEEN    CULT >=100,000 COLONIES/mL CITROBACTER FREUNDII (A) 10/06/2019   LABORGA CITROBACTER FREUNDII (A) 10/06/2019     Lab Results  Component Value Date   ALBUMIN 2.2 (L) 10/11/2019   ALBUMIN 2.1 (L) 09/28/2019   ALBUMIN 1.8 (L) 01/06/2019   PREALBUMIN 12.7 (L) 09/04/2016   LABURIC 3.2 06/13/2014   LABURIC 3.1 02/09/2014    Lab Results  Component Value Date   MG 2.1 12/16/2018   MG 2.8 (H) 06/29/2018   MG 3.0 (H) 06/29/2018   No results found for: VD25OH  Lab Results  Component Value Date   PREALBUMIN 12.7 (L) 09/04/2016   CBC EXTENDED Latest Ref Rng & Units 10/11/2019 09/28/2019 09/21/2019  WBC 4.0 - 10.5 K/uL 8.3 9.6 10.4  RBC 3.87 - 5.11 MIL/uL 3.52(L) 3.98 4.25  HGB 12.0 - 15.0 g/dL 9.5(L) 10.7(L) 11.7(L)  HCT 36.0 - 46.0 % 29.8(L) 34.4(L) 37.1  PLT 150 - 400 K/uL 654(H) 640(H) 755(H)  NEUTROABS 1.7 - 7.7 K/uL  4.5 7.1 -  LYMPHSABS 0.7 - 4.0 K/uL 2.5 1.4 -     Body mass index is 24.14 kg/m.  Orders:  No orders of the defined types were placed in this encounter.  No orders of the defined types were placed in this encounter.    Procedures: No procedures performed  Clinical Data: No additional findings.  ROS:  All other systems negative, except as noted in the HPI. Review of Systems  Objective: Vital Signs: Ht 5\' 2"  (1.575 m)   Wt 132 lb (59.9 kg)   LMP 02/12/2011 (Exact Date)   BMI 24.14 kg/m   Specialty Comments:  No specialty comments available.  PMFS History: Patient Active Problem List   Diagnosis Date Noted  . Adjustment disorder with mixed anxiety and depressed mood   . S/P bilateral BKA (below knee amputation) ([place]) 09/27/2019    . Acute osteomyelitis of left foot ([place]) 09/21/2019  . Subacute osteomyelitis, left ankle and foot ([place])   . Pressure injury of left heel, unstageable ([place]) 02/09/2019  . Hypoglycemia   . Diabetes mellitus type 2 in nonobese (H[facility])   . Labile blood pressure   . Labile blood glucose   . Phantom limb pain ([place])   . CKD (chronic kidney disease), stage III   . Hypothyroidism   . Essential hypertension   . Chronic diastolic congestive heart failure ([place])   . Coronary artery disease involving coronary bypass graft of native heart without angina pectoris   . Poorly controlled type 2 diabetes mellitus with peripheral neuropathy ([place])   . Acute on chronic anemia   . History of below knee amputation, right ([place]) 12/31/2018  . S/P CABG x 3 06/28/2018  . Coronary artery disease involving native coronary artery of native heart with angina pectoris ([place]) 06/25/2018  . Type 2 diabetes mellitus with vascular disease ([place]) 06/24/2018  . Acute on chronic diastolic CHF (congestive heart failure) ([place]) 06/24/2018  . Non-ST elevation (NSTEMI) myocardial infarction ([place]) 06/24/2018  . Chest pain 06/23/2018  . Glaucoma 09/17/2017  . Diabetic retinopathy ([place]) 09/17/2017  . Otitis media 11/10/2016  . Decreased visual acuity 11/10/2016  . Spinal stenosis, lumbar region with neurogenic claudication 10/22/2016  . Other spondylosis with radiculopathy, lumbar region 10/13/2016  . Lipoma of abdominal wall 10/05/2016  . Severe protein-calorie malnutrition ([place]) 09/04/2016  . Anemia of chronic disease 09/04/2016  . Diabetic polyneuropathy associated with type 2 diabetes mellitus ([place]) 05/07/2016  . Orthostatic hypotension 05/07/2016  . Cerebrovascular disease 04/27/2016  . N&V (nausea and vomiting) 04/01/2016  . ICH (intracerebral hemorrhage) ([place]) 03/30/2016  . Visit for preventive health examination 05/21/2015  . Breast cancer screening 05/21/2015  . Arm lesion 07/02/2014  . Pain in limb 01/13/2014  .  Neuropathic pain of both legs 01/13/2014  . Hip pain 01/12/2014  . Allergic rhinitis 03/10/2013  . Back pain 03/08/2013  . Depression 12/18/2012  . PVD (peripheral vascular disease) ([place]) 01/21/2012  . Hyperlipidemia 12/06/2010  . Overweight(278.02) 12/06/2010  . RESTLESS LEG SYNDROME 10/25/2010  . Migraine without aura 10/07/2010  . Hereditary and idiopathic peripheral neuropathy 10/07/2010  . Essential hypertension, benign 10/07/2010  . DISTURBANCE OF SKIN SENSATION 10/07/2010  . HEART MURMUR, HX OF 10/07/2010  . NEPHROLITHIASIS, HX OF 10/07/2010   Past Medical History:  Diagnosis Date  . Abscess of great toe, right   . Acute on chronic diastolic CHF (congestive heart failure) ([place]) 06/24/2018  . Acute osteomyelitis of toe, left ([place]) 09/05/2016  . Amputation of right great toe (  [place]) 12/22/2018  . Anxiety   . Cataract    left - surgery to remove  . Cellulitis of foot, right 12/11/2018  . COMMON MIGRAINE 10/07/2010  . Decreased visual acuity 11/10/2016  . Depression 12/18/2012  . Diabetes mellitus type II, uncontrolled ([place]) 10/07/2010   Qualifier: Diagnosis of  By: Charlett Blake MD, [name]    . Diabetic foot infection ([place]) 08/26/2016  . Diabetic infection of right foot ([place])   . Disturbance of skin sensation 10/07/2010  . Gangrene of right foot ([place])   . GERD (gastroesophageal reflux disease)   . Heart murmur   . History of kidney stones    "years ago" - passed stones  . Hyperlipidemia 12/06/2010  . Hypertension   . Hypothyroidism   . Lipoma of abdominal wall 10/05/2016  . Overweight(278.02) 12/06/2010  . PERIPHERAL NEUROPATHY, FEET 10/07/2010  . PVD (peripheral vascular disease) ([place]) 01/21/2012  . RESTLESS LEG SYNDROME 10/25/2010  . Stroke [facility]) 2014, 2017   most recently in 2/17 - intracerebral hemorrhage    Family History  Problem Relation Age of Onset  . Arthritis Mother   . Stroke Brother        previous smoker  . Alcohol abuse Brother        in remission  . Leukemia  Brother   . Diabetes Paternal Grandmother   . Healthy Son     Past Surgical History:  Procedure Laterality Date  . AMPUTATION Right 12/13/2018   Procedure: AMPUTATION RIGHT GREAT TOE, LOCAL RELOCATION OF TISSUE FOR WOUND CLOSURE 9cm x 3cm, VAC APPLICATION;  Surgeon: [name], MD;  Location: [place];  Service: Orthopedics;  Laterality: Right;  . AMPUTATION Right 12/31/2018   Procedure: RIGHT BELOW KNEE AMPUTATION;  Surgeon: [name], MD;  Location: [place];  Service: Orthopedics;  Laterality: Right;  . AMPUTATION Left 09/21/2019   Procedure: LEFT BELOW KNEE AMPUTATION;  Surgeon: [name], MD;  Location: [place];  Service: Orthopedics;  Laterality: Left;  . AMPUTATION TOE Left 09/09/2016   Procedure: AMPUTATION OF LEFT GREAT TOE;  Surgeon: Milly Jakob, MD;  Location: [place];  Service: Orthopedics;  Laterality: Left;  . BELOW KNEE LEG AMPUTATION Left 09/21/2019  . CARDIAC CATHETERIZATION  06/24/2018  . CORONARY ARTERY BYPASS GRAFT N/A 06/28/2018   Procedure: CORONARY ARTERY BYPASS GRAFTING (CABG) times  four, using left internal mammary artery, endoscopically harvested right saphenous vein, and harvested left radial artery;  Surgeon: [name], MD;  Location: [place];  Service: Open Heart Surgery;  Laterality: N/A;  . ENDOVEIN HARVEST OF GREATER SAPHENOUS VEIN Right 06/28/2018   Procedure: ENDOVEIN HARVEST OF GREATER SAPHENOUS VEIN;  Surgeon: [name], MD;  Location: [place];  Service: Open Heart Surgery;  Laterality: Right;  . EYE SURGERY Left 06/2017   [facility]  . LEFT HEART CATH AND CORONARY ANGIOGRAPHY N/A 06/24/2018   Procedure: LEFT HEART CATH AND CORONARY ANGIOGRAPHY;  Surgeon: Troy Sine, MD;  Location: [place] CV LAB;  Service: Cardiovascular;  Laterality: N/A;  . RADIAL ARTERY HARVEST Left 06/28/2018   Procedure: RADIAL ARTERY HARVEST;  Surgeon: [name], MD;  Location: [place];  Service: Open Heart Surgery;  Laterality:  Left;  . TEE WITHOUT CARDIOVERSION N/A 06/28/2018   Procedure: TRANSESOPHAGEAL ECHOCARDIOGRAM (TEE);  Surgeon: [name], MD;  Location: [place];  Service: Open Heart Surgery;  Laterality: N/A;  . WISDOM TOOTH EXTRACTION     Social History   Occupational History  .  Occupation: Glass blower/designer  Tobacco Use  . Smoking status: Former Smoker    Packs/day: 0.50    Quit date: 09/08/2005    Years since quitting: 14.1  . Smokeless tobacco: Never Used  Substance and Sexual Activity  . Alcohol use: No    Alcohol/week: 0.0 standard drinks  . Drug use: Not Currently  . Sexual activity: Yes    Partners: Male    Birth control/protection: Post-menopausal

## 2019-11-01 ENCOUNTER — Telehealth: Payer: Self-pay | Admitting: Orthopedic Surgery

## 2019-11-01 NOTE — Telephone Encounter (Signed)
Duplicate message. I will sign off on this message and hold the other in box until I am able to successfully reach the pt.

## 2019-11-01 NOTE — Telephone Encounter (Signed)
Patient called and stated that 2 stitches were left in her incision yesterday.. Were they intentionally left there?  Please call patient to advise  [phone]

## 2019-11-01 NOTE — Telephone Encounter (Signed)
I called pt and her mailbox is full and can not leave a message. Will hold and try again later to advise that sometimes stitches are underneath scabbing and that these two were missed and we can remove them at her next office visit ( of which she needs to schedule as this was not done yesterday) or she can come in earlier for a nurse only visit and we can remove that at her convenience.

## 2019-11-01 NOTE — Telephone Encounter (Signed)
Pt will come in for appt tomorrow at 9

## 2019-11-03 ENCOUNTER — Other Ambulatory Visit: Payer: Self-pay | Admitting: Orthopedic Surgery

## 2019-11-03 ENCOUNTER — Ambulatory Visit: Payer: BC Managed Care – PPO

## 2019-11-03 ENCOUNTER — Telehealth: Payer: Self-pay

## 2019-11-03 ENCOUNTER — Other Ambulatory Visit: Payer: Self-pay

## 2019-11-03 MED ORDER — OXYCODONE HCL 5 MG PO TABS: 5.0000 mg | ORAL_TABLET | ORAL | 0 refills | Status: DC | PRN

## 2019-11-03 NOTE — Telephone Encounter (Signed)
Patient requests refill of pain medication

## 2019-11-03 NOTE — Telephone Encounter (Signed)
Rx sent 

## 2019-11-04 ENCOUNTER — Telehealth: Payer: Self-pay | Admitting: Orthopedic Surgery

## 2019-11-04 NOTE — Telephone Encounter (Signed)
Noted  

## 2019-11-04 NOTE — Telephone Encounter (Signed)
Lori from [facility] home health called.   She was informing us that the patient refused all services this week, nursing and therapy. They are also reporting that the patient was extremely congested but refused a COVID test.   [facility] call back: [phone]

## 2019-11-07 NOTE — Progress Notes (Deleted)
{Choose 1 Note Type (Telehealth Visit or Telephone Visit):[phone]}  Evaluation Performed:  Follow-up visit  This visit type was conducted due to national recommendations for restrictions regarding the COVID-19 Pandemic (e.g. social distancing).  This format is felt to be most appropriate for this patient at this time.  All issues noted in this document were discussed and addressed.  No physical exam was performed (except for noted visual exam findings with Video Visits).  Please refer to the patient's chart (MyChart message for video visits and phone note for telephone visits) for the patient's consent to telehealth for [facility]  Date:  11/07/2019   ID:  [name], DOB 1965/12/04, MRN [id number]  Patient Location:  [place] [address]   Provider location:   [place] [address] Office [phone] Fax [phone]  PCP:  Mosie Lukes, MD  Cardiologist:  Elouise Munroe, MD  Electrophysiologist:  None   Chief Complaint: Follow-up  History of Present Illness:    [name] is a 54 y.o. female who presents via audio/video conferencing for a telehealth visit today.  Patient verified DOB and address.  The patient {does/does not:200015} symptoms concerning for COVID-19 infection (fever, chills, cough, or new SHORTNESS OF BREATH).   She has a PMH of CKD stage III, chronic diastolic CHF, CAD with CABG 10/19, HTN, intracerebral hemorrhage 2/17, HLD, remote tobacco use 13 years ago, diabetes mellitus with peripheral vascular disease with right foot gangrene April 2020, and subsequent right BKA.  She underwent CABG x4 10/19 and was lost to follow-up.  She did follow-up with her PCP.  She was also seen with surgical follow-up and November and December.  At that time she was noted to have a wound infection along her left radial incision site.  She presented for surgical follow-up in January and at that  time her incisions were healing well with no signs of infection.  In February she presented to the ED with right foot pain and redness x2 days.  She was found to have sepsis secondary to right foot cellulitis which required amputation.  She developed gangrenous changes along with wound dehiscence of the right foot and ultimately required a BKA.  She completed therapy from an orthopedic perspective.  And when she was seen last 02/2019 she was doing well from cardiac standpoint.  She denied chest pain with her physical therapy.  She was noted to still be struggling with the loss of her husband she had happened suddenly just prior to her CABG in October.  She denied chest pain, pressure, dyspnea, palpitations, PND orthopnea and swelling.  She also denied dizziness and lightheadedness.  She presents the clinic today for follow-up and states***  *** denies chest pain, shortness of breath, lower extremity edema, fatigue, palpitations, melena, hematuria, hemoptysis, diaphoresis, weakness, presyncope, syncope, orthopnea, and PND.    Prior CV studies:   The following studies were reviewed today:  EKG  Echocardiogram  ***  Past Medical History:  Diagnosis Date  . Abscess of great toe, right   . Acute on chronic diastolic CHF (congestive heart failure) ([place]) 06/24/2018  . Acute osteomyelitis of toe, left ([place]) 09/05/2016  . Amputation of right great toe ([place]) 12/22/2018  . Anxiety   . Cataract    left - surgery to remove  . Cellulitis of foot, right 12/11/2018  . COMMON MIGRAINE 10/07/2010  . Decreased visual acuity 11/10/2016  . Depression 12/18/2012  . Diabetes  mellitus type II, uncontrolled ([place]) 10/07/2010   Qualifier: Diagnosis of  By: Charlett Blake MD, [name]    . Diabetic foot infection ([place]) 08/26/2016  . Diabetic infection of right foot ([place])   . Disturbance of skin sensation 10/07/2010  . Gangrene of right foot ([place])   . GERD (gastroesophageal reflux disease)   . Heart murmur   . History of  kidney stones    "years ago" - passed stones  . Hyperlipidemia 12/06/2010  . Hypertension   . Hypothyroidism   . Lipoma of abdominal wall 10/05/2016  . Overweight(278.02) 12/06/2010  . PERIPHERAL NEUROPATHY, FEET 10/07/2010  . PVD (peripheral vascular disease) ([place]) 01/21/2012  . RESTLESS LEG SYNDROME 10/25/2010  . Stroke [facility]) 2014, 2017   most recently in 2/17 - intracerebral hemorrhage   Past Surgical History:  Procedure Laterality Date  . AMPUTATION Right 12/13/2018   Procedure: AMPUTATION RIGHT GREAT TOE, LOCAL RELOCATION OF TISSUE FOR WOUND CLOSURE 9cm x 3cm, VAC APPLICATION;  Surgeon: [name], MD;  Location: [place];  Service: Orthopedics;  Laterality: Right;  . AMPUTATION Right 12/31/2018   Procedure: RIGHT BELOW KNEE AMPUTATION;  Surgeon: [name], MD;  Location: [place];  Service: Orthopedics;  Laterality: Right;  . AMPUTATION Left 09/21/2019   Procedure: LEFT BELOW KNEE AMPUTATION;  Surgeon: [name], MD;  Location: [place];  Service: Orthopedics;  Laterality: Left;  . AMPUTATION TOE Left 09/09/2016   Procedure: AMPUTATION OF LEFT GREAT TOE;  Surgeon: Milly Jakob, MD;  Location: [facility];  Service: Orthopedics;  Laterality: Left;  . BELOW KNEE LEG AMPUTATION Left 09/21/2019  . CARDIAC CATHETERIZATION  06/24/2018  . CORONARY ARTERY BYPASS GRAFT N/A 06/28/2018   Procedure: CORONARY ARTERY BYPASS GRAFTING (CABG) times  four, using left internal mammary artery, endoscopically harvested right saphenous vein, and harvested left radial artery;  Surgeon: [name], MD;  Location: [facility];  Service: Open Heart Surgery;  Laterality: N/A;  . ENDOVEIN HARVEST OF GREATER SAPHENOUS VEIN Right 06/28/2018   Procedure: ENDOVEIN HARVEST OF GREATER SAPHENOUS VEIN;  Surgeon: [name], MD;  Location: [place];  Service: Open Heart Surgery;  Laterality: Right;  . EYE SURGERY Left 06/2017   [facility]  . LEFT HEART CATH AND CORONARY ANGIOGRAPHY N/A 06/24/2018    Procedure: LEFT HEART CATH AND CORONARY ANGIOGRAPHY;  Surgeon: Troy Sine, MD;  Location: [place] CV LAB;  Service: Cardiovascular;  Laterality: N/A;  . RADIAL ARTERY HARVEST Left 06/28/2018   Procedure: RADIAL ARTERY HARVEST;  Surgeon: [name], MD;  Location: [place];  Service: Open Heart Surgery;  Laterality: Left;  . TEE WITHOUT CARDIOVERSION N/A 06/28/2018   Procedure: TRANSESOPHAGEAL ECHOCARDIOGRAM (TEE);  Surgeon: [name], MD;  Location: [facility];  Service: Open Heart Surgery;  Laterality: N/A;  . WISDOM TOOTH EXTRACTION       No outpatient medications have been marked as taking for the 11/08/19 encounter (Appointment) with Deberah Pelton, NP.     Allergies:   Penicillins, Lyrica [pregabalin], Morphine and related, and Tramadol   Social History   Tobacco Use  . Smoking status: Former Smoker    Packs/day: 0.50    Quit date: 09/08/2005    Years since quitting: 14.1  . Smokeless tobacco: Never Used  Substance Use Topics  . Alcohol use: No    Alcohol/week: 0.0 standard drinks  . Drug use: Not Currently     Family Hx: The patient's family history includes Alcohol abuse in  her brother; Arthritis in her mother; Diabetes in her paternal grandmother; Healthy in her son; Leukemia in her brother; Stroke in her brother.  ROS:   Please see the history of present illness.     All other systems reviewed and are negative.   Labs/Other Tests and Data Reviewed:    Recent Labs: 12/16/2018: Magnesium 2.1 10/11/2019: ALT 17; BUN 20; Creatinine, Ser 1.66; Hemoglobin 9.5; Platelets 654; Potassium 4.2; Sodium 139   Recent Lipid Panel Lab Results  Component Value Date/Time   CHOL 200 06/24/2018 10:45 AM   TRIG 127 06/24/2018 10:45 AM   HDL 36 (L) 06/24/2018 10:45 AM   CHOLHDL 5.6 06/24/2018 10:45 AM   LDLCALC 139 (H) 06/24/2018 10:45 AM   LDLDIRECT 133.0 03/02/2017 08:38 AM    Wt Readings from Last 3 Encounters:  10/31/19 132 lb (59.9 kg)  10/17/19 132 lb  (59.9 kg)  10/12/19 132 lb 4.4 oz (60 kg)     Exam:    Vital Signs:  LMP 02/12/2011 (Exact Date)    Well nourished, well developed female in no  acute distress.   ASSESSMENT & PLAN:    1.  Status post CABG x4-no chest pain today.  She has increased her physical activity and has been eating a heart healthy diet. Continue aspirin 81 mg daily Heart healthy low-sodium diet-salty 6 given Increase physical activity as tolerated  Essential hypertension-BP today*** Continue Heart healthy low-sodium diet Increase physical activity as tolerated  Hyperlipidemia-LDL 139 06/24/2018 Continue atorvastatin Heart healthy low-sodium high-fiber diet Increase physical activity as tolerated  Diabetes mellitus type 2-A1c 14.1 09/21/2019 Continue Heart healthy low-sodium carb modified diet Increase physical activity as tolerated Followed by PCP  COVID-19 Education: The signs and symptoms of COVID-19 were discussed with the patient and how to seek care for testing (follow up with PCP or arrange E-visit).  The importance of social distancing was discussed today.  Patient Risk:   After full review of this patients clinical status, I feel that they are at least moderate risk at this time.  Time:   Today, I have spent *** minutes with the patient with telehealth technology discussing ***.     Medication Adjustments/Labs and Tests Ordered: Current medicines are reviewed at length with the patient today.  Concerns regarding medicines are outlined above.   Tests Ordered: No orders of the defined types were placed in this encounter.  Medication Changes: No orders of the defined types were placed in this encounter.   Disposition:  {follow up:15908}  Signed, Jossie Ng. [facility] [place] [address] Office [phone] Fax [phone]

## 2019-11-08 ENCOUNTER — Telehealth: Payer: BC Managed Care – PPO | Admitting: General Practice

## 2019-11-09 ENCOUNTER — Telehealth: Payer: BC Managed Care – PPO | Admitting: General Practice

## 2019-11-09 DIAGNOSIS — E1122 Type 2 diabetes mellitus with diabetic chronic kidney disease: Secondary | ICD-10-CM | POA: Diagnosis not present

## 2019-11-09 DIAGNOSIS — E039 Hypothyroidism, unspecified: Secondary | ICD-10-CM | POA: Diagnosis not present

## 2019-11-09 DIAGNOSIS — E1142 Type 2 diabetes mellitus with diabetic polyneuropathy: Secondary | ICD-10-CM | POA: Diagnosis not present

## 2019-11-09 DIAGNOSIS — I130 Hypertensive heart and chronic kidney disease with heart failure and stage 1 through stage 4 chronic kidney disease, or unspecified chronic kidney disease: Secondary | ICD-10-CM | POA: Diagnosis not present

## 2019-11-09 DIAGNOSIS — F329 Major depressive disorder, single episode, unspecified: Secondary | ICD-10-CM | POA: Diagnosis not present

## 2019-11-09 DIAGNOSIS — D631 Anemia in chronic kidney disease: Secondary | ICD-10-CM | POA: Diagnosis not present

## 2019-11-09 DIAGNOSIS — N183 Chronic kidney disease, stage 3 unspecified: Secondary | ICD-10-CM | POA: Diagnosis not present

## 2019-11-09 DIAGNOSIS — I2510 Atherosclerotic heart disease of native coronary artery without angina pectoris: Secondary | ICD-10-CM | POA: Diagnosis not present

## 2019-11-09 DIAGNOSIS — I5032 Chronic diastolic (congestive) heart failure: Secondary | ICD-10-CM | POA: Diagnosis not present

## 2019-11-09 DIAGNOSIS — Z4781 Encounter for orthopedic aftercare following surgical amputation: Secondary | ICD-10-CM | POA: Diagnosis not present

## 2019-11-09 DIAGNOSIS — E1151 Type 2 diabetes mellitus with diabetic peripheral angiopathy without gangrene: Secondary | ICD-10-CM | POA: Diagnosis not present

## 2019-11-09 DIAGNOSIS — G546 Phantom limb syndrome with pain: Secondary | ICD-10-CM | POA: Diagnosis not present

## 2019-11-09 DIAGNOSIS — E785 Hyperlipidemia, unspecified: Secondary | ICD-10-CM | POA: Diagnosis not present

## 2019-11-09 DIAGNOSIS — M48062 Spinal stenosis, lumbar region with neurogenic claudication: Secondary | ICD-10-CM | POA: Diagnosis not present

## 2019-11-09 DIAGNOSIS — E11319 Type 2 diabetes mellitus with unspecified diabetic retinopathy without macular edema: Secondary | ICD-10-CM | POA: Diagnosis not present

## 2019-11-09 DIAGNOSIS — M4726 Other spondylosis with radiculopathy, lumbar region: Secondary | ICD-10-CM | POA: Diagnosis not present

## 2019-11-09 NOTE — Progress Notes (Unsigned)
{Choose 1 Note Type (Telehealth Visit or Telephone Visit):[phone]}  Evaluation Performed:  Follow-up visit  This visit type was conducted due to national recommendations for restrictions regarding the COVID-19 Pandemic (e.g. social distancing).  This format is felt to be most appropriate for this patient at this time.  All issues noted in this document were discussed and addressed.  No physical exam was performed (except for noted visual exam findings with Video Visits).  Please refer to the patient's chart (MyChart message for video visits and phone note for telephone visits) for the patient's consent to telehealth for [facility]  Date:  11/09/2019   ID:  [name], DOB Jan 16, 1966, MRN [id number]  Patient Location:  [place] [address]   Provider location:     [place] [address] Office [phone] Fax [phone]   PCP:  Mosie Lukes, MD  Cardiologist:  Elouise Munroe, MD *** Electrophysiologist:  None   Chief Complaint: Follow-up  History of Present Illness:    [name] is a 54 y.o. female who presents via audio/video conferencing for a telehealth visit today.  Patient verified DOB and address.  The patient {does/does not:200015} symptoms concerning for COVID-19 infection (fever, chills, cough, or new SHORTNESS OF BREATH).   [name] has a past medical history of CKD 3, chronic diastolic CHF, CAD s/p CABG x4 10/19, HTN, intracerebral hemorrhage 2/17, HLD, remote tobacco use, DM with peripheral vascular disease and right foot gangrene in April 2020 with subsequent BKA.  After her CABG times 11 June 2018 she was discharged on amlodipine, metoprolol, Imdur, Lasix, atorvastatin.  Her follow-up visit was scheduled for 1 month after discharge.  However, she was lost to follow-up.  Fortunately she did follow-up with her PCP and with surgery in November and December.  She was noted to  have a wound infection of her left radial incision site.  Although, at her surgical follow-up in January her incision was well-healed with no signs of infection.  In February 2020 she presented to the emergency department with a painful right foot and erythema.  She was diagnosed with sepsis secondary to right foot cellulitis requiring an amputation of the right first toe.  She developed gangrenous changes and her wound dehisced.  She developed low-grade fevers and required a right BKA.  She was able to complete her physical therapy.  When she was seen by Dr. Marcelline Mates of 02/24/2019 she was doing well from the cardiac perspective.  She denied chest pain with physical therapy and remained asymptomatic.  She did note that she was still struggling with the loss of her spouse that had occurred suddenly prior to her bypass surgery in October 19.  She denied chest pain or pressure with exertion, palpitations, PND, lower extremity edema and orthopnea.  She also denied syncope and presyncope.  She denied lightheadedness and dizziness.  She presents virtually today for follow-up and states***  *** denies chest pain, shortness of breath, lower extremity edema, fatigue, palpitations, melena, hematuria, hemoptysis, diaphoresis, weakness, presyncope, syncope, orthopnea, and PND.   Prior CV studies:   The following studies were reviewed today:  EKG 09/07/2019 Normal sinus rhythm 93 bpm  Echocardiogram 06/28/2018 The left ventricular cavity was at the upper limits of normal in size and measured 4.7 cm at end-diastole at the mid papillary level.  There was mild concentric left ventricular hypertrophy.  The inferior wall measured 1.07 cm and the anterior wall measured  1.08 cm at end diastole at the mid papillary level.  There was akinesis of the distal anterior wall, anterior septum, and apex.  The ejection fraction was estimated at 40%.  There were no other regional wall motion abnormalities.  On the post bypass exam  the LV function appeared unchanged from the pre-bypass study.  There was persistent akinesis of the distal anterior wall anterior septum and apex.  Septum The interatrial septum was intact without evidence of patent foramen ovale or atrial septal defect by color Doppler and bubble study.  Left Atrium Left atrial cavity was normal in diameter and there was no thrombus noted within the left atrium or left atrial appendage.  Aortic Valve The aortic valve leaflets were normal in thickness.  The leaflets opened without restriction.  There was no aortic insufficiency.  Aorta Dilation comments: The aortic root and ascending aorta were normal in diameter.  There was moderate intimal thickening with no protruding atheromatous disease.  The sinuses of Valsalva measured 2.83 cm.  The sinotubular ridge 2.56 cm and the proximal ascending aorta 2.92 cm.  The descending aorta was 2.2 cm in diameter and there was no significant atheromatous disease in the descending aorta.  Mitral Valve The mitral leaflets are mildly thickened.  The leaflets opened normally.  There was normal coaptation of the leaflets without prolapsing or flail leaflet segments.  There was trace mitral insufficiency.  Right Ventricle The RV was normal in size.  There is normal RV systolic function.  At the completion of the procedure following chest closure, there was a reduction in filling of the right ventricle.  The RV wall motion appeared unchanged.  Right Atrium Normal right atrial size.  Tricuspid Valve Tricuspid valve leaflets appeared structurally normal.  There was 1+ tricuspid insufficiency.    The tricuspid annulus was normal in diameter and measured 2.8 cm in the four-chamber view in diastole.  Pulmonic Valve Normal pulmonic valve structure. Trace regurgitation.  Pericardium No pericardial effusion.      Past Medical History:  Diagnosis Date  . Abscess of great toe, right   . Acute on chronic diastolic CHF (congestive heart  failure) ([place]) 06/24/2018  . Acute osteomyelitis of toe, left ([place]) 09/05/2016  . Amputation of right great toe ([place]) 12/22/2018  . Anxiety   . Cataract    left - surgery to remove  . Cellulitis of foot, right 12/11/2018  . COMMON MIGRAINE 10/07/2010  . Decreased visual acuity 11/10/2016  . Depression 12/18/2012  . Diabetes mellitus type II, uncontrolled ([place]) 10/07/2010   Qualifier: Diagnosis of  By: Charlett Blake MD, [name]    . Diabetic foot infection ([place]) 08/26/2016  . Diabetic infection of right foot ([place])   . Disturbance of skin sensation 10/07/2010  . Gangrene of right foot ([place])   . GERD (gastroesophageal reflux disease)   . Heart murmur   . History of kidney stones    "years ago" - passed stones  . Hyperlipidemia 12/06/2010  . Hypertension   . Hypothyroidism   . Lipoma of abdominal wall 10/05/2016  . Overweight(278.02) 12/06/2010  . PERIPHERAL NEUROPATHY, FEET 10/07/2010  . PVD (peripheral vascular disease) ([place]) 01/21/2012  . RESTLESS LEG SYNDROME 10/25/2010  . Stroke [facility]) 2014, 2017   most recently in 2/17 - intracerebral hemorrhage   Past Surgical History:  Procedure Laterality Date  . AMPUTATION Right 12/13/2018   Procedure: AMPUTATION RIGHT GREAT TOE, LOCAL RELOCATION OF TISSUE FOR WOUND CLOSURE 9cm x 3cm, VAC APPLICATION;  Surgeon: [name],  MD;  Location: [facility];  Service: Orthopedics;  Laterality: Right;  . AMPUTATION Right 12/31/2018   Procedure: RIGHT BELOW KNEE AMPUTATION;  Surgeon: [name], MD;  Location: [place];  Service: Orthopedics;  Laterality: Right;  . AMPUTATION Left 09/21/2019   Procedure: LEFT BELOW KNEE AMPUTATION;  Surgeon: [name], MD;  Location: [facility];  Service: Orthopedics;  Laterality: Left;  . AMPUTATION TOE Left 09/09/2016   Procedure: AMPUTATION OF LEFT GREAT TOE;  Surgeon: Milly Jakob, MD;  Location: [place];  Service: Orthopedics;  Laterality: Left;  . BELOW KNEE LEG AMPUTATION Left 09/21/2019  . CARDIAC CATHETERIZATION   06/24/2018  . CORONARY ARTERY BYPASS GRAFT N/A 06/28/2018   Procedure: CORONARY ARTERY BYPASS GRAFTING (CABG) times  four, using left internal mammary artery, endoscopically harvested right saphenous vein, and harvested left radial artery;  Surgeon: [name], MD;  Location: [facility];  Service: Open Heart Surgery;  Laterality: N/A;  . ENDOVEIN HARVEST OF GREATER SAPHENOUS VEIN Right 06/28/2018   Procedure: ENDOVEIN HARVEST OF GREATER SAPHENOUS VEIN;  Surgeon: [name], MD;  Location: [facility];  Service: Open Heart Surgery;  Laterality: Right;  . EYE SURGERY Left 06/2017   [facility]  . LEFT HEART CATH AND CORONARY ANGIOGRAPHY N/A 06/24/2018   Procedure: LEFT HEART CATH AND CORONARY ANGIOGRAPHY;  Surgeon: Troy Sine, MD;  Location: [facility] CV LAB;  Service: Cardiovascular;  Laterality: N/A;  . RADIAL ARTERY HARVEST Left 06/28/2018   Procedure: RADIAL ARTERY HARVEST;  Surgeon: [name], MD;  Location: [facility];  Service: Open Heart Surgery;  Laterality: Left;  . TEE WITHOUT CARDIOVERSION N/A 06/28/2018   Procedure: TRANSESOPHAGEAL ECHOCARDIOGRAM (TEE);  Surgeon: [name], MD;  Location: [facility];  Service: Open Heart Surgery;  Laterality: N/A;  . WISDOM TOOTH EXTRACTION       No outpatient medications have been marked as taking for the 11/09/19 encounter (Appointment) with Deberah Pelton, NP.     Allergies:   Penicillins, Lyrica [pregabalin], Morphine and related, and Tramadol   Social History   Tobacco Use  . Smoking status: Former Smoker    Packs/day: 0.50    Quit date: 09/08/2005    Years since quitting: 14.1  . Smokeless tobacco: Never Used  Substance Use Topics  . Alcohol use: No    Alcohol/week: 0.0 standard drinks  . Drug use: Not Currently     Family Hx: The patient's family history includes Alcohol abuse in her brother; Arthritis in her mother; Diabetes in her paternal grandmother; Healthy in her son; Leukemia in her brother; Stroke  in her brother.  ROS:   Please see the history of present illness.     All other systems reviewed and are negative.   Labs/Other Tests and Data Reviewed:    Recent Labs: 12/16/2018: Magnesium 2.1 10/11/2019: ALT 17; BUN 20; Creatinine, Ser 1.66; Hemoglobin 9.5; Platelets 654; Potassium 4.2; Sodium 139   Recent Lipid Panel Lab Results  Component Value Date/Time   CHOL 200 06/24/2018 10:45 AM   TRIG 127 06/24/2018 10:45 AM   HDL 36 (L) 06/24/2018 10:45 AM   CHOLHDL 5.6 06/24/2018 10:45 AM   LDLCALC 139 (H) 06/24/2018 10:45 AM   LDLDIRECT 133.0 03/02/2017 08:38 AM    Wt Readings from Last 3 Encounters:  10/31/19 132 lb (59.9 kg)  10/17/19 132 lb (59.9 kg)  10/12/19 132 lb 4.4 oz (60 kg)     Exam:    Vital Signs:  LMP 02/12/2011 (Exact Date)    Well nourished, well developed female in no  acute distress.   ASSESSMENT & PLAN:    1.  Coronary artery disease-status post CABG x4 10/19.  No chest pain today. Continue aspirin 81 mg tablet daily Atorvastatin 80 mg tablet daily Nitroglycerin 0.2 mg patch daily Heart healthy low-sodium diet Increase physical activity as tolerated  Hyperlipidemia-LDL 139 06/24/2018 Continue atorvastatin 80 mg daily Repeat lipid panel  Essential hypertension-BP today*** Continue amlodipine 10 mg daily Hydralazine 10 mg twice daily Heart healthy low-sodium diet-salty 6 given Increase physical activity as tolerated  Diabetes mellitus-A1c 14.1 09/21/2019 Continue Lantus Heart healthy low-sodium carb modified diet Managed by PCP   COVID-19 Education: The signs and symptoms of COVID-19 were discussed with the patient and how to seek care for testing (follow up with PCP or arrange E-visit).  The importance of social distancing was discussed today.  Patient Risk:   After full review of this patients clinical status, I feel that they are at least moderate risk at this time.  Time:   Today, I have spent *** minutes with the patient with  telehealth technology discussing ***.     Medication Adjustments/Labs and Tests Ordered: Current medicines are reviewed at length with the patient today.  Concerns regarding medicines are outlined above.   Tests Ordered: No orders of the defined types were placed in this encounter.  Medication Changes: No orders of the defined types were placed in this encounter.   Disposition:  {follow up:15908}  Signed,  Jossie Ng. [facility] [place] [address] Office [phone] Fax [phone]

## 2019-11-11 DIAGNOSIS — I2510 Atherosclerotic heart disease of native coronary artery without angina pectoris: Secondary | ICD-10-CM | POA: Diagnosis not present

## 2019-11-11 DIAGNOSIS — M4726 Other spondylosis with radiculopathy, lumbar region: Secondary | ICD-10-CM | POA: Diagnosis not present

## 2019-11-11 DIAGNOSIS — E1122 Type 2 diabetes mellitus with diabetic chronic kidney disease: Secondary | ICD-10-CM | POA: Diagnosis not present

## 2019-11-11 DIAGNOSIS — Z4781 Encounter for orthopedic aftercare following surgical amputation: Secondary | ICD-10-CM | POA: Diagnosis not present

## 2019-11-11 DIAGNOSIS — M48062 Spinal stenosis, lumbar region with neurogenic claudication: Secondary | ICD-10-CM | POA: Diagnosis not present

## 2019-11-11 DIAGNOSIS — E785 Hyperlipidemia, unspecified: Secondary | ICD-10-CM | POA: Diagnosis not present

## 2019-11-11 DIAGNOSIS — D631 Anemia in chronic kidney disease: Secondary | ICD-10-CM | POA: Diagnosis not present

## 2019-11-11 DIAGNOSIS — E039 Hypothyroidism, unspecified: Secondary | ICD-10-CM | POA: Diagnosis not present

## 2019-11-11 DIAGNOSIS — I5032 Chronic diastolic (congestive) heart failure: Secondary | ICD-10-CM | POA: Diagnosis not present

## 2019-11-11 DIAGNOSIS — F329 Major depressive disorder, single episode, unspecified: Secondary | ICD-10-CM | POA: Diagnosis not present

## 2019-11-11 DIAGNOSIS — E1151 Type 2 diabetes mellitus with diabetic peripheral angiopathy without gangrene: Secondary | ICD-10-CM | POA: Diagnosis not present

## 2019-11-11 DIAGNOSIS — E11319 Type 2 diabetes mellitus with unspecified diabetic retinopathy without macular edema: Secondary | ICD-10-CM | POA: Diagnosis not present

## 2019-11-11 DIAGNOSIS — G546 Phantom limb syndrome with pain: Secondary | ICD-10-CM | POA: Diagnosis not present

## 2019-11-11 DIAGNOSIS — N183 Chronic kidney disease, stage 3 unspecified: Secondary | ICD-10-CM | POA: Diagnosis not present

## 2019-11-11 DIAGNOSIS — I130 Hypertensive heart and chronic kidney disease with heart failure and stage 1 through stage 4 chronic kidney disease, or unspecified chronic kidney disease: Secondary | ICD-10-CM | POA: Diagnosis not present

## 2019-11-11 DIAGNOSIS — E1142 Type 2 diabetes mellitus with diabetic polyneuropathy: Secondary | ICD-10-CM | POA: Diagnosis not present

## 2019-11-14 ENCOUNTER — Other Ambulatory Visit: Payer: Self-pay

## 2019-11-14 ENCOUNTER — Other Ambulatory Visit: Payer: Self-pay | Admitting: Physician Assistant

## 2019-11-14 ENCOUNTER — Ambulatory Visit: Payer: BC Managed Care – PPO | Admitting: Orthopedic Surgery

## 2019-11-14 ENCOUNTER — Encounter: Payer: Self-pay | Admitting: Physician Assistant

## 2019-11-14 VITALS — Ht 62.0 in | Wt 132.0 lb

## 2019-11-14 DIAGNOSIS — T8781 Dehiscence of amputation stump: Secondary | ICD-10-CM

## 2019-11-14 DIAGNOSIS — Z4781 Encounter for orthopedic aftercare following surgical amputation: Secondary | ICD-10-CM | POA: Diagnosis not present

## 2019-11-14 DIAGNOSIS — E785 Hyperlipidemia, unspecified: Secondary | ICD-10-CM | POA: Diagnosis not present

## 2019-11-14 DIAGNOSIS — Z89511 Acquired absence of right leg below knee: Secondary | ICD-10-CM

## 2019-11-14 DIAGNOSIS — I5032 Chronic diastolic (congestive) heart failure: Secondary | ICD-10-CM | POA: Diagnosis not present

## 2019-11-14 DIAGNOSIS — I2510 Atherosclerotic heart disease of native coronary artery without angina pectoris: Secondary | ICD-10-CM | POA: Diagnosis not present

## 2019-11-14 DIAGNOSIS — E1142 Type 2 diabetes mellitus with diabetic polyneuropathy: Secondary | ICD-10-CM | POA: Diagnosis not present

## 2019-11-14 DIAGNOSIS — E039 Hypothyroidism, unspecified: Secondary | ICD-10-CM | POA: Diagnosis not present

## 2019-11-14 DIAGNOSIS — I130 Hypertensive heart and chronic kidney disease with heart failure and stage 1 through stage 4 chronic kidney disease, or unspecified chronic kidney disease: Secondary | ICD-10-CM | POA: Diagnosis not present

## 2019-11-14 DIAGNOSIS — Z89512 Acquired absence of left leg below knee: Secondary | ICD-10-CM

## 2019-11-14 DIAGNOSIS — E11319 Type 2 diabetes mellitus with unspecified diabetic retinopathy without macular edema: Secondary | ICD-10-CM | POA: Diagnosis not present

## 2019-11-14 DIAGNOSIS — G546 Phantom limb syndrome with pain: Secondary | ICD-10-CM | POA: Diagnosis not present

## 2019-11-14 DIAGNOSIS — E1151 Type 2 diabetes mellitus with diabetic peripheral angiopathy without gangrene: Secondary | ICD-10-CM | POA: Diagnosis not present

## 2019-11-14 DIAGNOSIS — F329 Major depressive disorder, single episode, unspecified: Secondary | ICD-10-CM | POA: Diagnosis not present

## 2019-11-14 DIAGNOSIS — M48062 Spinal stenosis, lumbar region with neurogenic claudication: Secondary | ICD-10-CM | POA: Diagnosis not present

## 2019-11-14 DIAGNOSIS — N183 Chronic kidney disease, stage 3 unspecified: Secondary | ICD-10-CM | POA: Diagnosis not present

## 2019-11-14 DIAGNOSIS — D631 Anemia in chronic kidney disease: Secondary | ICD-10-CM | POA: Diagnosis not present

## 2019-11-14 DIAGNOSIS — M4726 Other spondylosis with radiculopathy, lumbar region: Secondary | ICD-10-CM | POA: Diagnosis not present

## 2019-11-14 DIAGNOSIS — E1122 Type 2 diabetes mellitus with diabetic chronic kidney disease: Secondary | ICD-10-CM | POA: Diagnosis not present

## 2019-11-15 ENCOUNTER — Other Ambulatory Visit: Payer: Self-pay | Admitting: Orthopedic Surgery

## 2019-11-15 ENCOUNTER — Encounter: Payer: Self-pay | Admitting: Orthopedic Surgery

## 2019-11-15 ENCOUNTER — Telehealth: Payer: Self-pay | Admitting: Orthopedic Surgery

## 2019-11-15 MED ORDER — OXYCODONE HCL 5 MG PO TABS: 5.0000 mg | ORAL_TABLET | ORAL | 0 refills | Status: DC | PRN

## 2019-11-15 NOTE — Telephone Encounter (Signed)
Patient called and requested a refill of medication Oxycodone. Patient is having surgery Friday but in much pain and have since yesterday.  Please call patient [phone]

## 2019-11-15 NOTE — Telephone Encounter (Signed)
Rx sent 

## 2019-11-15 NOTE — Telephone Encounter (Signed)
Called pt to advise

## 2019-11-15 NOTE — Telephone Encounter (Signed)
Pt scheduled for revision BKA Friday. Please see message below and advise.

## 2019-11-15 NOTE — Progress Notes (Signed)
Office Visit Note   Patient: [name]           Date of Birth: 1965/11/30           MRN: [id number] Visit Date: 11/14/2019              Requested by: Mosie Lukes, MD [address] [place],   [id number] PCP: Mosie Lukes, MD  Chief Complaint  Patient presents with  . Left Leg - Routine Post Op    09/21/19 left BKA       HPI: Patient is a 54 year old woman status post bilateral transtibial amputations.  She is currently wearing a prosthesis on the right.  Patient has had progressive wound dehiscence on the left with drainage black eschar and pain.  Patient is not on antibiotics.  She states a friend came over and put some caustic cleaning solution on the wound.  Assessment & Plan: Visit Diagnoses:  1. Acquired absence of left leg below knee (HCC)   2. Acquired absence of right leg below knee (HCC)   3. Dehiscence of amputation stump (HCC)     Plan: Due to the progressive gangrenous changes we will plan for revision of the left transtibial amputation.  Risks and benefits were discussed including risk of the wound not healing.  Patient states she would like to proceed with surgery and would like to wait until Friday.  Follow-Up Instructions: Return in about 2 weeks (around 11/28/2019).   Ortho Exam  Patient is alert, oriented, no adenopathy, well-dressed, normal affect, normal respiratory effort. Examination patient has progressive gangrenous changes of the left transtibial amputation there is thin black eschar there is no cellulitis no odor no drainage no signs of infection.  Imaging: No results found. No images are attached to the encounter.  Labs: Lab Results  Component Value Date   HGBA1C 14.1 (H) 09/21/2019   HGBA1C 10.5 (H) 12/12/2018   HGBA1C 11.1 (H) 06/26/2018   ESRSEDRATE 130 (H) 12/12/2018   ESRSEDRATE 115 (H) 06/24/2018   ESRSEDRATE 107 (H) 09/04/2016   CRP 28.2 (H) 12/12/2018   CRP 7.2 (H) 09/04/2016   CRP 0.7 03/21/2015    LABURIC 3.2 06/13/2014   LABURIC 3.1 02/09/2014   REPTSTATUS 10/08/2019 FINAL 10/06/2019   GRAMSTAIN  12/13/2018    FEW WBC PRESENT,BOTH PMN AND MONONUCLEAR NO ORGANISMS SEEN    CULT >=100,000 COLONIES/mL CITROBACTER FREUNDII (A) 10/06/2019   LABORGA CITROBACTER FREUNDII (A) 10/06/2019     Lab Results  Component Value Date   ALBUMIN 2.2 (L) 10/11/2019   ALBUMIN 2.1 (L) 09/28/2019   ALBUMIN 1.8 (L) 01/06/2019   PREALBUMIN 12.7 (L) 09/04/2016   LABURIC 3.2 06/13/2014   LABURIC 3.1 02/09/2014    Lab Results  Component Value Date   MG 2.1 12/16/2018   MG 2.8 (H) 06/29/2018   MG 3.0 (H) 06/29/2018   No results found for: VD25OH  Lab Results  Component Value Date   PREALBUMIN 12.7 (L) 09/04/2016   CBC EXTENDED Latest Ref Rng & Units 10/11/2019 09/28/2019 09/21/2019  WBC 4.0 - 10.5 K/uL 8.3 9.6 10.4  RBC 3.87 - 5.11 MIL/uL 3.52(L) 3.98 4.25  HGB 12.0 - 15.0 g/dL 9.5(L) 10.7(L) 11.7(L)  HCT 36.0 - 46.0 % 29.8(L) 34.4(L) 37.1  PLT 150 - 400 K/uL 654(H) 640(H) 755(H)  NEUTROABS 1.7 - 7.7 K/uL 4.5 7.1 -  LYMPHSABS 0.7 - 4.0 K/uL 2.5 1.4 -     Body mass index is 24.14 kg/m.  Orders:  No orders of the defined types were placed in this encounter.  No orders of the defined types were placed in this encounter.    Procedures: No procedures performed  Clinical Data: No additional findings.  ROS:  All other systems negative, except as noted in the HPI. Review of Systems  Objective: Vital Signs: Ht 5\' 2"  (1.575 m)   Wt 132 lb (59.9 kg)   LMP 02/12/2011 (Exact Date)   BMI 24.14 kg/m   Specialty Comments:  No specialty comments available.  PMFS History: Patient Active Problem List   Diagnosis Date Noted  . Adjustment disorder with mixed anxiety and depressed mood   . S/P bilateral BKA (below knee amputation) ([place]) 09/27/2019  . Acute osteomyelitis of left foot ([place]) 09/21/2019  . Subacute osteomyelitis, left ankle and foot ([place])   . Pressure injury of left  heel, unstageable ([place]) 02/09/2019  . Hypoglycemia   . Diabetes mellitus type 2 in nonobese (HCC)   . Labile blood pressure   . Labile blood glucose   . Phantom limb pain ([place])   . CKD (chronic kidney disease), stage III   . Hypothyroidism   . Essential hypertension   . Chronic diastolic congestive heart failure ([place])   . Coronary artery disease involving coronary bypass graft of native heart without angina pectoris   . Poorly controlled type 2 diabetes mellitus with peripheral neuropathy ([place])   . Acute on chronic anemia   . History of below knee amputation, right ([place]) 12/31/2018  . S/P CABG x 3 06/28/2018  . Coronary artery disease involving native coronary artery of native heart with angina pectoris ([place]) 06/25/2018  . Type 2 diabetes mellitus with vascular disease ([place]) 06/24/2018  . Acute on chronic diastolic CHF (congestive heart failure) ([place]) 06/24/2018  . Non-ST elevation (NSTEMI) myocardial infarction ([place]) 06/24/2018  . Chest pain 06/23/2018  . Glaucoma 09/17/2017  . Diabetic retinopathy ([place]) 09/17/2017  . Otitis media 11/10/2016  . Decreased visual acuity 11/10/2016  . Spinal stenosis, lumbar region with neurogenic claudication 10/22/2016  . Other spondylosis with radiculopathy, lumbar region 10/13/2016  . Lipoma of abdominal wall 10/05/2016  . Severe protein-calorie malnutrition ([place]) 09/04/2016  . Anemia of chronic disease 09/04/2016  . Diabetic polyneuropathy associated with type 2 diabetes mellitus ([place]) 05/07/2016  . Orthostatic hypotension 05/07/2016  . Cerebrovascular disease 04/27/2016  . N&V (nausea and vomiting) 04/01/2016  . ICH (intracerebral hemorrhage) ([place]) 03/30/2016  . Visit for preventive health examination 05/21/2015  . Breast cancer screening 05/21/2015  . Arm lesion 07/02/2014  . Pain in limb 01/13/2014  . Neuropathic pain of both legs 01/13/2014  . Hip pain 01/12/2014  . Allergic rhinitis 03/10/2013  . Back pain 03/08/2013  . Depression  12/18/2012  . PVD (peripheral vascular disease) ([place]) 01/21/2012  . Hyperlipidemia 12/06/2010  . Overweight(278.02) 12/06/2010  . RESTLESS LEG SYNDROME 10/25/2010  . Migraine without aura 10/07/2010  . Hereditary and idiopathic peripheral neuropathy 10/07/2010  . Essential hypertension, benign 10/07/2010  . DISTURBANCE OF SKIN SENSATION 10/07/2010  . HEART MURMUR, HX OF 10/07/2010  . NEPHROLITHIASIS, HX OF 10/07/2010   Past Medical History:  Diagnosis Date  . Abscess of great toe, right   . Acute on chronic diastolic CHF (congestive heart failure) ([place]) 06/24/2018  . Acute osteomyelitis of toe, left ([place]) 09/05/2016  . Amputation of right great toe ([place]) 12/22/2018  . Anxiety   . Cataract    left - surgery to remove  . Cellulitis of foot, right 12/11/2018  .  COMMON MIGRAINE 10/07/2010  . Decreased visual acuity 11/10/2016  . Depression 12/18/2012  . Diabetes mellitus type II, uncontrolled ([place]) 10/07/2010   Qualifier: Diagnosis of  By: Charlett Blake MD, [name]    . Diabetic foot infection ([place]) 08/26/2016  . Diabetic infection of right foot ([place])   . Disturbance of skin sensation 10/07/2010  . Gangrene of right foot ([place])   . GERD (gastroesophageal reflux disease)   . Heart murmur   . History of kidney stones    "years ago" - passed stones  . Hyperlipidemia 12/06/2010  . Hypertension   . Hypothyroidism   . Lipoma of abdominal wall 10/05/2016  . Overweight(278.02) 12/06/2010  . PERIPHERAL NEUROPATHY, FEET 10/07/2010  . PVD (peripheral vascular disease) ([place]) 01/21/2012  . RESTLESS LEG SYNDROME 10/25/2010  . Stroke [facility]) 2014, 2017   most recently in 2/17 - intracerebral hemorrhage    Family History  Problem Relation Age of Onset  . Arthritis Mother   . Stroke Brother        previous smoker  . Alcohol abuse Brother        in remission  . Leukemia Brother   . Diabetes Paternal Grandmother   . Healthy Son     Past Surgical History:  Procedure Laterality Date  . AMPUTATION Right  12/13/2018   Procedure: AMPUTATION RIGHT GREAT TOE, LOCAL RELOCATION OF TISSUE FOR WOUND CLOSURE 9cm x 3cm, VAC APPLICATION;  Surgeon: [name], MD;  Location: [place];  Service: Orthopedics;  Laterality: Right;  . AMPUTATION Right 12/31/2018   Procedure: RIGHT BELOW KNEE AMPUTATION;  Surgeon: [name], MD;  Location: [place];  Service: Orthopedics;  Laterality: Right;  . AMPUTATION Left 09/21/2019   Procedure: LEFT BELOW KNEE AMPUTATION;  Surgeon: [name], MD;  Location: [place];  Service: Orthopedics;  Laterality: Left;  . AMPUTATION TOE Left 09/09/2016   Procedure: AMPUTATION OF LEFT GREAT TOE;  Surgeon: Milly Jakob, MD;  Location: [place];  Service: Orthopedics;  Laterality: Left;  . BELOW KNEE LEG AMPUTATION Left 09/21/2019  . CARDIAC CATHETERIZATION  06/24/2018  . CORONARY ARTERY BYPASS GRAFT N/A 06/28/2018   Procedure: CORONARY ARTERY BYPASS GRAFTING (CABG) times  four, using left internal mammary artery, endoscopically harvested right saphenous vein, and harvested left radial artery;  Surgeon: [name], MD;  Location: [place];  Service: Open Heart Surgery;  Laterality: N/A;  . ENDOVEIN HARVEST OF GREATER SAPHENOUS VEIN Right 06/28/2018   Procedure: ENDOVEIN HARVEST OF GREATER SAPHENOUS VEIN;  Surgeon: [name], MD;  Location: [place];  Service: Open Heart Surgery;  Laterality: Right;  . EYE SURGERY Left 06/2017   [facility]  . LEFT HEART CATH AND CORONARY ANGIOGRAPHY N/A 06/24/2018   Procedure: LEFT HEART CATH AND CORONARY ANGIOGRAPHY;  Surgeon: Troy Sine, MD;  Location: [place] [facility];  Service: Cardiovascular;  Laterality: N/A;  . RADIAL ARTERY HARVEST Left 06/28/2018   Procedure: RADIAL ARTERY HARVEST;  Surgeon: [name], MD;  Location: [place];  Service: Open Heart Surgery;  Laterality: Left;  . TEE WITHOUT CARDIOVERSION N/A 06/28/2018   Procedure: TRANSESOPHAGEAL ECHOCARDIOGRAM (TEE);  Surgeon: [name],  MD;  Location: [place];  Service: Open Heart Surgery;  Laterality: N/A;  . WISDOM TOOTH EXTRACTION     Social History   Occupational History  . Occupation: Glass blower/designer  Tobacco Use  . Smoking status: Former Smoker    Packs/day: 0.50    Quit date: 09/08/2005  Years since quitting: 14.1  . Smokeless tobacco: Never Used  Substance and Sexual Activity  . Alcohol use: No    Alcohol/week: 0.0 standard drinks  . Drug use: Not Currently  . Sexual activity: Yes    Partners: Male    Birth control/protection: Post-menopausal

## 2019-11-16 ENCOUNTER — Other Ambulatory Visit (HOSPITAL_COMMUNITY)
Admission: RE | Admit: 2019-11-16 | Discharge: 2019-11-16 | Disposition: A | Discharge status: Home / Self Care | Payer: BC Managed Care – PPO | Source: Ambulatory Visit | Attending: Orthopedic Surgery | Admitting: Orthopedic Surgery

## 2019-11-16 DIAGNOSIS — Z20822 Contact with and (suspected) exposure to covid-19: Secondary | ICD-10-CM | POA: Diagnosis not present

## 2019-11-16 DIAGNOSIS — Z01812 Encounter for preprocedural laboratory examination: Secondary | ICD-10-CM | POA: Diagnosis not present

## 2019-11-16 LAB — SARS CORONAVIRUS 2 (TAT 6-24 HRS): SARS Coronavirus 2: NEGATIVE

## 2019-11-17 ENCOUNTER — Ambulatory Visit: Payer: BC Managed Care – PPO | Admitting: Orthopedic Surgery

## 2019-11-17 ENCOUNTER — Encounter (HOSPITAL_COMMUNITY): Payer: Self-pay | Admitting: Orthopedic Surgery

## 2019-11-17 ENCOUNTER — Other Ambulatory Visit: Payer: Self-pay

## 2019-11-17 DIAGNOSIS — E1142 Type 2 diabetes mellitus with diabetic polyneuropathy: Secondary | ICD-10-CM | POA: Diagnosis not present

## 2019-11-17 DIAGNOSIS — I5032 Chronic diastolic (congestive) heart failure: Secondary | ICD-10-CM | POA: Diagnosis not present

## 2019-11-17 DIAGNOSIS — M4726 Other spondylosis with radiculopathy, lumbar region: Secondary | ICD-10-CM | POA: Diagnosis not present

## 2019-11-17 DIAGNOSIS — D631 Anemia in chronic kidney disease: Secondary | ICD-10-CM | POA: Diagnosis not present

## 2019-11-17 DIAGNOSIS — E1151 Type 2 diabetes mellitus with diabetic peripheral angiopathy without gangrene: Secondary | ICD-10-CM | POA: Diagnosis not present

## 2019-11-17 DIAGNOSIS — I2510 Atherosclerotic heart disease of native coronary artery without angina pectoris: Secondary | ICD-10-CM | POA: Diagnosis not present

## 2019-11-17 DIAGNOSIS — E785 Hyperlipidemia, unspecified: Secondary | ICD-10-CM | POA: Diagnosis not present

## 2019-11-17 DIAGNOSIS — E1122 Type 2 diabetes mellitus with diabetic chronic kidney disease: Secondary | ICD-10-CM | POA: Diagnosis not present

## 2019-11-17 DIAGNOSIS — G546 Phantom limb syndrome with pain: Secondary | ICD-10-CM | POA: Diagnosis not present

## 2019-11-17 DIAGNOSIS — I130 Hypertensive heart and chronic kidney disease with heart failure and stage 1 through stage 4 chronic kidney disease, or unspecified chronic kidney disease: Secondary | ICD-10-CM | POA: Diagnosis not present

## 2019-11-17 DIAGNOSIS — Z4781 Encounter for orthopedic aftercare following surgical amputation: Secondary | ICD-10-CM | POA: Diagnosis not present

## 2019-11-17 DIAGNOSIS — F329 Major depressive disorder, single episode, unspecified: Secondary | ICD-10-CM | POA: Diagnosis not present

## 2019-11-17 DIAGNOSIS — E039 Hypothyroidism, unspecified: Secondary | ICD-10-CM | POA: Diagnosis not present

## 2019-11-17 DIAGNOSIS — M48062 Spinal stenosis, lumbar region with neurogenic claudication: Secondary | ICD-10-CM | POA: Diagnosis not present

## 2019-11-17 DIAGNOSIS — N183 Chronic kidney disease, stage 3 unspecified: Secondary | ICD-10-CM | POA: Diagnosis not present

## 2019-11-17 DIAGNOSIS — E11319 Type 2 diabetes mellitus with unspecified diabetic retinopathy without macular edema: Secondary | ICD-10-CM | POA: Diagnosis not present

## 2019-11-17 NOTE — Progress Notes (Signed)
Anesthesia Chart Review: SAME DAY WORK-UP   Case: [id number] Date/Time: 11/18/19 0930   Procedure: REVISION LEFT BELOW KNEE AMPUTATION (Left )   Anesthesia type: General   Pre-op diagnosis: Dehiscence Left Below Knee Amputation   Location: [facility] OR ROOM 05 / [place] [facility]   Surgeons: [name], MD      DISCUSSION: Patient is a 53 year old female scheduled for the above procedure. She is s/p right BKA 12/31/18 and s/p left BKA 09/21/19. She has had issues with medication non-compliance.   She was initially scheduled for left BKA on 08/26/19, but was too emotionally distraught about surgery and left. Surgery was rescheduled for 09/07/19, but she arrived with BP of 219/113 and CBG 362 and surgery cancelled with instructions to resume medications and follow-up with primary care. She came back in for surgery on 09/21/19 with BP 210/109, 207/93, 202/96 with CBG 128 (although A1c 14.1%). She did undergo left BKA that day. She was discharged to CIR but has been at home since 10/12/19. Discharge medications included amlodipine 10 mg daily, hydralazine 10 mg twice daily, Nitro-Dur patch 0.2 mg daily, Lantus 7 units nightly unfortunately, she has developed progressive wound dehiscence on the left with drainage, black eschar, and pain. She saw Dr. [name] Given on 11/14/19 and scheduled for left BKA revision.   History includes CAD (NSTEMI, s/p CABG: LIMA-LAD, SVG-D1-Anterolateral1, left RA-OM1 06/28/18; poor quality targets except LAD, conduits small but good quality, not a candidate for redo CABG by Op Note), chronic diastolic CHF, HTN, DM2 (with neuropathy), CVA (2014; [place] 2017), HLD, PAD (s/p right BKA 12/31/18; left BKA 09/21/19), hypothyroidism, former smoker (quit 2007).   Last seen by cardiologist Dr. [name] on 02/24/19 and continued medical therapy for CAD recommended. She was a no show for 11/09/19 telemedicine visit with Smitty Knudsen, NP.   She reported that she is limiting her medications again. She doesn't feel they  help her. Only taking ASA, insulin, and occasional oxycodone. She is not on her BP medications. She will occasionally check home CBG and BP with reported glucose readings ~ 170 and BP ~ 160/80. (She presented with SBP in the 200's previously when she presented for left BKA a few months ago when off medications.) I have notified Dr. [name] Given of this.    Pre-surgical COVID-19 test was negative on 11/16/19. She is a same day work-up, so further evaluation on the day of surgery. If BP or glucose significantly elevated than anesthesiologist may have to have a conversation with surgeon regarding urgency of procedure an/or consider as needed intervention (for glucose/BP)  given issue with wound dehiscence with gangrene and drainage.    VS: LMP 02/12/2011 (Exact Date)   BP Readings from Last 3 Encounters:  10/12/19 127/72  09/27/19 (!) 147/82  09/07/19 (!) 219/113   Pulse Readings from Last 3 Encounters:  10/12/19 91  09/27/19 80  09/07/19 93    PROVIDERS: Mosie Lukes, MD is PCP  Cherlynn Kaiser, MD is cardiologist ([facility])   LABS: For day of surgery. As of 10/11/19, H/H 9.5/29.8, PLT 654, Cr 1.66 (previously 0.94-1.21 since 01/2019).  A1c 14.1% (up from 10.5% on 12/12/18).    EKG: 09/07/19:   Normal sinus rhythm Minimal voltage criteria for LVH, may be normal variant ( R in aVL ) Nonspecific ST and T wave abnormality Prolonged QT (QT/QTc 372/462 ms) Abnormal ECG Confirmed by Loralie Champagne [phone]) on 09/07/2019 11:46:44 PM   CV: TEE (intra-op CABG) 06/28/18: Final Result  Mitral valve: The mitral  leaflets are mildly thickened. The leaflets opened normally. There was normal coaptation of the leaflets without prolapsing or flail leaflet segments. There was trace mitral insufficiency.  Right ventricle: The RV was normal in size. There is normal RV systolic function. At the completion of the procedure following chest closure, there was a reduction in filling of the right ventricle.  The RV wall motion appeared unchanged.  Tricuspid valve: Tricuspid valve leaflets appeared structurally normal. There was 1+ tricuspid insufficiency. The tricuspid annulus was normal in diameter and measured 2.8 cm in the four-chamber view in diastole.  Pulmonic valve: Trace regurgitation.   Echo 06/24/18: Study Conclusions - Left ventricle: The cavity size was normal. Wall thickness was increased in a pattern of mild LVH. Systolic function was low normal to mildly reduced. The estimated ejection fraction was in the range of 50% to 55%. Anterior and anterolateral hypokinesis. Features are consistent with a pseudonormal left ventricular filling pattern, with concomitant abnormal relaxation and increased filling pressure (grade 2 diastolic dysfunction). - Aortic valve: There was no stenosis. - Mitral valve: There was no significant regurgitation. - Left atrium: The atrium was mildly dilated. - Right ventricle: The cavity size was normal. Systolic function was normal. - Pulmonary arteries: No complete TR doppler jet so unable to estimate PA systolic pressure. - Systemic veins: IVC measured 1.8 cm with < 50% respirophasic variation, suggesting RA pressure 8 mmHg. Impressions: - Normal LV size with mild LV hypertrophy. EF 50-55%, anterior and anterolateral hypokinesis. Normal RV size and systolic function. No significant valvular abnormalities.   Carotid duplex 06/25/18:  Summary: Right Carotid: Velocities in the right ICA are consistent with a 1-39% stenosis. Left Carotid: Velocities in the left ICA are consistent with a 1-39% stenosis. Vertebrals: Bilateral vertebral arteries demonstrate antegrade flow. Subclavians: Normal flow hemodynamics were seen in bilateral subclavian arteries.   Last cardiac cath was 06/24/18 prior to CABG.   Past Medical History:  Diagnosis Date  . Abscess of great toe, right   . Acute on chronic diastolic CHF  (congestive heart failure) ([place]) 06/24/2018  . Acute osteomyelitis of toe, left ([place]) 09/05/2016  . Amputation of right great toe ([place]) 12/22/2018  . Anxiety   . Cataract    left - surgery to remove  . Cellulitis of foot, right 12/11/2018  . COMMON MIGRAINE 10/07/2010  . Coronary artery disease   . Decreased visual acuity 11/10/2016  . Depression 12/18/2012  . Diabetes mellitus type II, uncontrolled ([place]) 10/07/2010   Qualifier: Diagnosis of  By: Charlett Blake MD, [name]    . Diabetic foot infection ([place]) 08/26/2016  . Diabetic infection of right foot ([place])   . Disturbance of skin sensation 10/07/2010  . Gangrene of right foot ([place])   . GERD (gastroesophageal reflux disease)   . Heart murmur   . History of kidney stones    "years ago" - passed stones  . Hyperlipidemia 12/06/2010  . Hypertension   . Hypothyroidism   . Lipoma of abdominal wall 10/05/2016  . Overweight(278.02) 12/06/2010  . PERIPHERAL NEUROPATHY, FEET 10/07/2010   11/17/2019  . PVD (peripheral vascular disease) ([place]) 01/21/2012  . RESTLESS LEG SYNDROME 10/25/2010  . Stroke [facility]) 2014, 2017   most recently in 2/17 - intracerebral hemorrhage    Past Surgical History:  Procedure Laterality Date  . AMPUTATION Right 12/13/2018   Procedure: AMPUTATION RIGHT GREAT TOE, LOCAL RELOCATION OF TISSUE FOR WOUND CLOSURE 9cm x 3cm, VAC APPLICATION;  Surgeon: [name], MD;  Location: [place];  Service:  Orthopedics;  Laterality: Right;  . AMPUTATION Right 12/31/2018   Procedure: RIGHT BELOW KNEE AMPUTATION;  Surgeon: [name], MD;  Location: [place];  Service: Orthopedics;  Laterality: Right;  . AMPUTATION Left 09/21/2019   Procedure: LEFT BELOW KNEE AMPUTATION;  Surgeon: [name], MD;  Location: [place];  Service: Orthopedics;  Laterality: Left;  . AMPUTATION TOE Left 09/09/2016   Procedure: AMPUTATION OF LEFT GREAT TOE;  Surgeon: Milly Jakob, MD;  Location: [place];  Service: Orthopedics;  Laterality: Left;  . BELOW KNEE  LEG AMPUTATION Left 09/21/2019  . CARDIAC CATHETERIZATION  06/24/2018  . CORONARY ARTERY BYPASS GRAFT N/A 06/28/2018   Procedure: CORONARY ARTERY BYPASS GRAFTING (CABG) times  four, using left internal mammary artery, endoscopically harvested right saphenous vein, and harvested left radial artery;  Surgeon: [name], MD;  Location: [place];  Service: Open Heart Surgery;  Laterality: N/A;  . ENDOVEIN HARVEST OF GREATER SAPHENOUS VEIN Right 06/28/2018   Procedure: ENDOVEIN HARVEST OF GREATER SAPHENOUS VEIN;  Surgeon: [name], MD;  Location: [place];  Service: Open Heart Surgery;  Laterality: Right;  . EYE SURGERY Left 06/2017   [facility]  . LEFT HEART CATH AND CORONARY ANGIOGRAPHY N/A 06/24/2018   Procedure: LEFT HEART CATH AND CORONARY ANGIOGRAPHY;  Surgeon: Troy Sine, MD;  Location: [place] [facility];  Service: Cardiovascular;  Laterality: N/A;  . RADIAL ARTERY HARVEST Left 06/28/2018   Procedure: RADIAL ARTERY HARVEST;  Surgeon: [name], MD;  Location: [place];  Service: Open Heart Surgery;  Laterality: Left;  . TEE WITHOUT CARDIOVERSION N/A 06/28/2018   Procedure: TRANSESOPHAGEAL ECHOCARDIOGRAM (TEE);  Surgeon: [name], MD;  Location: [place];  Service: Open Heart Surgery;  Laterality: N/A;  . WISDOM TOOTH EXTRACTION      MEDICATIONS: No current facility-administered medications for this encounter.   Marland Kitchen acetaminophen (TYLENOL) 325 MG tablet  . amLODipine (NORVASC) 10 MG tablet  . aspirin EC 81 MG tablet  . atorvastatin (LIPITOR) 80 MG tablet  . azelastine (ASTELIN) 0.1 % nasal spray  . docusate sodium (COLACE) 100 MG capsule  . escitalopram (LEXAPRO) 10 MG tablet  . hydrALAZINE (APRESOLINE) 10 MG tablet  . Insulin Glargine (LANTUS) 100 UNIT/ML Solostar Pen  . levothyroxine (SYNTHROID) 25 MCG tablet  . methocarbamol (ROBAXIN) 500 MG tablet  . metoCLOPramide (REGLAN) 5 MG tablet  . Multiple Vitamin (MULTIVITAMIN WITH MINERALS) TABS  tablet  . nitroGLYCERIN (NITRODUR - DOSED IN MG/24 HR) 0.2 mg/hr patch  . oxyCODONE (OXY IR/ROXICODONE) 5 MG immediate release tablet  . pantoprazole (PROTONIX) 40 MG tablet  . polyethylene glycol (MIRALAX / GLYCOLAX) 17 g packet  . sulfamethoxazole-trimethoprim (BACTRIM DS) 800-160 MG tablet   Reported that she is only taking ASA, insulin, and oxycodone.  Myra Gianotti, PA-C Surgical Short Stay/Anesthesiology [facility] Phone [phone] [facility] Phone [phone] 11/17/2019 12:18 PM

## 2019-11-17 NOTE — Anesthesia Preprocedure Evaluation (Addendum)
Anesthesia Evaluation  Patient identified by MRN, date of birth, ID band Patient awake    Reviewed: Allergy & Precautions, NPO status , Patient's Chart, lab work & pertinent test results  Airway Mallampati: II  TM Distance: >3 FB Neck ROM: Full    Dental  (+) Edentulous Upper, Edentulous Lower   Pulmonary neg pulmonary ROS, former smoker,    Pulmonary exam normal breath sounds clear to auscultation       Cardiovascular hypertension, Pt. on medications + angina + CAD, + Past MI, + CABG (s/p CABG: LIMA-LAD, SVG-D1-Anterolateral1, left RA-OM1 06/28/18; poor quality targets except LAD, conduits small but good quality, not a candidate for redo CABG), + Peripheral Vascular Disease and +CHF  Normal cardiovascular exam Rhythm:Regular Rate:Normal  TEE (intra-op CABG) 06/28/18: Final Result  Mitral valve: The mitral leaflets are mildly thickened. The leaflets opened normally. There was normal coaptation of the leaflets without prolapsing or flail leaflet segments. There was trace mitral insufficiency.  Right ventricle: The RV was normal in size. There is normal RV systolic function. At the completion of the procedure following chest closure, there was a reduction in filling of the right ventricle. The RV wall motion appeared unchanged.  Tricuspid valve: Tricuspid valve leaflets appeared structurally normal. There was 1+ tricuspid insufficiency. The tricuspid annulus was normal in diameter and measured 2.8 cm in the four-chamber view in diastole.  Pulmonic valve: Trace regurgitation.  TTE 2019 Impressions: - Normal LV size with mild LV hypertrophy. EF 50-55%, anterior and anterolateral hypokinesis. Normal RV size and systolic function. No significant valvular abnormalities   Neuro/Psych  Headaches, PSYCHIATRIC DISORDERS Anxiety Depression CVA (2014; [place] 2017)    GI/Hepatic Neg liver ROS, GERD  ,  Endo/Other  diabetes, Poorly  ControlledHypothyroidism   Renal/GU Renal InsufficiencyRenal disease  negative genitourinary   Musculoskeletal  (+) Arthritis , s/p right BKA 12/31/18 and s/p left BKA 09/21/19   Abdominal   Peds  Hematology negative hematology ROS (+)   Anesthesia Other Findings   Reproductive/Obstetrics                            Anesthesia Physical Anesthesia Plan  ASA: III  Anesthesia Plan: General and Regional   Post-op Pain Management:  Regional for Post-op pain   Induction: Intravenous  PONV Risk Score and Plan: 3 and Ondansetron, Midazolam and Treatment may vary due to age or medical condition  Airway Management Planned: LMA  Additional Equipment:   Intra-op Plan:   Post-operative Plan: Extubation in OR  Informed Consent: I have reviewed the patients History and Physical, chart, labs and discussed the procedure including the risks, benefits and alternatives for the proposed anesthesia with the patient or authorized representative who has indicated his/her understanding and acceptance.     Dental advisory given  Plan Discussed with: CRNA  Anesthesia Plan Comments: (   )      Anesthesia Quick Evaluation

## 2019-11-17 NOTE — Progress Notes (Addendum)
Mrs [name] denies chest pain or shortness of breath. Patient teste negative for Covid 11/16/2019 for Covid and is in quarantine with her husband.Mrs [name] stated that she usually has 2 grandchildren living with her, but they have colds and are staying with neighbors until; after surgery.  Mrs [name] reports that she checks CBG's occassionally, it runs around 170.  Mrs [name] reports that she is not taking any of her medications except: "Aspirin, Insulin and occassionally Oxycodone."  "I do not like taking medications." "I'm going to die anyway."  I asked if she is wanting to die, she said , "No I'm going to, I don't have any legs."  I don't want to die, I have 2 grandchildren that live with me." I told patient that she is harming herself by not taking medications."  Mrs. [name] said that the medication are not doing any good." My blood pressure is 160/80 with out any medications. Patient said that she did not start back medications after she was discharged from the hospital on 2/3.  Patient  Saw Dr. [name] Given 2/22 and saw a NP for [facility] 3/3, patient did not inform them. I am notifying Malachy Mood that works with Dr. [name] Given and asking PA-C for anesthesia to review.  I instructed patient to not eat after midnight, but she may drink clear liquids until 0645. Mrs [name] states she drink water.  I instructed Mrs. [name] to take 1/2 of Lantus dose at bedtime and 25 units in am if CBG is greater than 70.  I instructed patient to check CBG after awaking and every 2 hours until arrival  to the hospital.  I Instructed patient if CBG is less than 70 to drink 1/2 cup of a clear juice. Recheck CBG in 15 minutes then call pre- op desk at [phone] for further instructions. If scheduled to receive Insulin, do not take Insulin

## 2019-11-18 ENCOUNTER — Encounter (HOSPITAL_COMMUNITY)
Admission: RE | Disposition: A | Discharge status: Home / Self Care | Payer: Self-pay | Source: Home / Self Care | Attending: Orthopedic Surgery

## 2019-11-18 ENCOUNTER — Ambulatory Visit (HOSPITAL_COMMUNITY): Payer: BC Managed Care – PPO | Admitting: Vascular Surgery

## 2019-11-18 ENCOUNTER — Ambulatory Visit (HOSPITAL_COMMUNITY)
Admission: RE | Admit: 2019-11-18 | Discharge: 2019-11-18 | Disposition: A | Discharge status: Home / Self Care | Payer: BC Managed Care – PPO | Attending: Orthopedic Surgery | Admitting: Orthopedic Surgery

## 2019-11-18 ENCOUNTER — Other Ambulatory Visit: Payer: Self-pay

## 2019-11-18 ENCOUNTER — Encounter (HOSPITAL_COMMUNITY): Payer: Self-pay | Admitting: Orthopedic Surgery

## 2019-11-18 DIAGNOSIS — Z87891 Personal history of nicotine dependence: Secondary | ICD-10-CM | POA: Diagnosis not present

## 2019-11-18 DIAGNOSIS — F329 Major depressive disorder, single episode, unspecified: Secondary | ICD-10-CM | POA: Diagnosis not present

## 2019-11-18 DIAGNOSIS — Z806 Family history of leukemia: Secondary | ICD-10-CM | POA: Insufficient documentation

## 2019-11-18 DIAGNOSIS — Z79899 Other long term (current) drug therapy: Secondary | ICD-10-CM | POA: Insufficient documentation

## 2019-11-18 DIAGNOSIS — Z89512 Acquired absence of left leg below knee: Secondary | ICD-10-CM | POA: Diagnosis not present

## 2019-11-18 DIAGNOSIS — G2581 Restless legs syndrome: Secondary | ICD-10-CM | POA: Diagnosis not present

## 2019-11-18 DIAGNOSIS — Z8673 Personal history of transient ischemic attack (TIA), and cerebral infarction without residual deficits: Secondary | ICD-10-CM | POA: Diagnosis not present

## 2019-11-18 DIAGNOSIS — Z823 Family history of stroke: Secondary | ICD-10-CM | POA: Insufficient documentation

## 2019-11-18 DIAGNOSIS — Z7982 Long term (current) use of aspirin: Secondary | ICD-10-CM | POA: Insufficient documentation

## 2019-11-18 DIAGNOSIS — F419 Anxiety disorder, unspecified: Secondary | ICD-10-CM | POA: Diagnosis not present

## 2019-11-18 DIAGNOSIS — Z87442 Personal history of urinary calculi: Secondary | ICD-10-CM | POA: Diagnosis not present

## 2019-11-18 DIAGNOSIS — E785 Hyperlipidemia, unspecified: Secondary | ICD-10-CM | POA: Insufficient documentation

## 2019-11-18 DIAGNOSIS — Z811 Family history of alcohol abuse and dependence: Secondary | ICD-10-CM | POA: Insufficient documentation

## 2019-11-18 DIAGNOSIS — I5033 Acute on chronic diastolic (congestive) heart failure: Secondary | ICD-10-CM | POA: Diagnosis not present

## 2019-11-18 DIAGNOSIS — I252 Old myocardial infarction: Secondary | ICD-10-CM | POA: Diagnosis not present

## 2019-11-18 DIAGNOSIS — Z89511 Acquired absence of right leg below knee: Secondary | ICD-10-CM | POA: Insufficient documentation

## 2019-11-18 DIAGNOSIS — E039 Hypothyroidism, unspecified: Secondary | ICD-10-CM | POA: Diagnosis not present

## 2019-11-18 DIAGNOSIS — I071 Rheumatic tricuspid insufficiency: Secondary | ICD-10-CM | POA: Insufficient documentation

## 2019-11-18 DIAGNOSIS — Z8261 Family history of arthritis: Secondary | ICD-10-CM | POA: Insufficient documentation

## 2019-11-18 DIAGNOSIS — T8781 Dehiscence of amputation stump: Secondary | ICD-10-CM

## 2019-11-18 DIAGNOSIS — Z951 Presence of aortocoronary bypass graft: Secondary | ICD-10-CM | POA: Insufficient documentation

## 2019-11-18 DIAGNOSIS — Z794 Long term (current) use of insulin: Secondary | ICD-10-CM | POA: Diagnosis not present

## 2019-11-18 DIAGNOSIS — I110 Hypertensive heart disease with heart failure: Secondary | ICD-10-CM | POA: Diagnosis not present

## 2019-11-18 DIAGNOSIS — E1151 Type 2 diabetes mellitus with diabetic peripheral angiopathy without gangrene: Secondary | ICD-10-CM | POA: Insufficient documentation

## 2019-11-18 DIAGNOSIS — I25119 Atherosclerotic heart disease of native coronary artery with unspecified angina pectoris: Secondary | ICD-10-CM | POA: Diagnosis not present

## 2019-11-18 DIAGNOSIS — R519 Headache, unspecified: Secondary | ICD-10-CM | POA: Insufficient documentation

## 2019-11-18 DIAGNOSIS — Y835 Amputation of limb(s) as the cause of abnormal reaction of the patient, or of later complication, without mention of misadventure at the time of the procedure: Secondary | ICD-10-CM | POA: Insufficient documentation

## 2019-11-18 DIAGNOSIS — Z885 Allergy status to narcotic agent status: Secondary | ICD-10-CM | POA: Insufficient documentation

## 2019-11-18 DIAGNOSIS — K219 Gastro-esophageal reflux disease without esophagitis: Secondary | ICD-10-CM | POA: Insufficient documentation

## 2019-11-18 DIAGNOSIS — Z880 Allergy status to penicillin: Secondary | ICD-10-CM | POA: Insufficient documentation

## 2019-11-18 DIAGNOSIS — I5032 Chronic diastolic (congestive) heart failure: Secondary | ICD-10-CM | POA: Insufficient documentation

## 2019-11-18 DIAGNOSIS — Z888 Allergy status to other drugs, medicaments and biological substances status: Secondary | ICD-10-CM | POA: Insufficient documentation

## 2019-11-18 DIAGNOSIS — M1990 Unspecified osteoarthritis, unspecified site: Secondary | ICD-10-CM | POA: Diagnosis not present

## 2019-11-18 DIAGNOSIS — Z833 Family history of diabetes mellitus: Secondary | ICD-10-CM | POA: Insufficient documentation

## 2019-11-18 DIAGNOSIS — I130 Hypertensive heart and chronic kidney disease with heart failure and stage 1 through stage 4 chronic kidney disease, or unspecified chronic kidney disease: Secondary | ICD-10-CM | POA: Diagnosis not present

## 2019-11-18 HISTORY — DX: Atherosclerotic heart disease of native coronary artery without angina pectoris: I25.10

## 2019-11-18 HISTORY — PX: STUMP REVISION: SHX6102

## 2019-11-18 LAB — GLUCOSE, CAPILLARY
Glucose-Capillary: 291 mg/dL — ABNORMAL HIGH (ref 70–99)
Glucose-Capillary: 327 mg/dL — ABNORMAL HIGH (ref 70–99)
Glucose-Capillary: 268 mg/dL — ABNORMAL HIGH (ref 70–99)
Glucose-Capillary: 298 mg/dL — ABNORMAL HIGH (ref 70–99)
Glucose-Capillary: 322 mg/dL — ABNORMAL HIGH (ref 70–99)
Glucose-Capillary: 267 mg/dL — ABNORMAL HIGH (ref 70–99)

## 2019-11-18 SURGERY — REVISION, AMPUTATION SITE
Anesthesia: Regional | Laterality: Left

## 2019-11-18 MED ORDER — FENTANYL CITRATE (PF) 100 MCG/2ML IJ SOLN
25.0000 ug | INTRAMUSCULAR | Status: DC | PRN
  Administered 2019-11-18 (×2): 25 ug via INTRAVENOUS

## 2019-11-18 MED ORDER — ONDANSETRON HCL 4 MG/2ML IJ SOLN
INTRAMUSCULAR | Status: DC | PRN
  Administered 2019-11-18: 4 mg via INTRAVENOUS

## 2019-11-18 MED ORDER — CEFAZOLIN SODIUM-DEXTROSE 2-4 GM/100ML-% IV SOLN
2.0000 g | INTRAVENOUS | Status: AC
  Administered 2019-11-18 (×2): 2 g via INTRAVENOUS
  Filled 2019-11-18: qty 100

## 2019-11-18 MED ORDER — CHLORHEXIDINE GLUCONATE 4 % EX LIQD: 60.0000 mL | Freq: Once | CUTANEOUS | Status: DC

## 2019-11-18 MED ORDER — INSULIN ASPART 100 UNIT/ML ~~LOC~~ SOLN
8.0000 [IU] | Freq: Once | SUBCUTANEOUS | Status: AC
  Administered 2019-11-18: 8 [IU] via INTRAVENOUS

## 2019-11-18 MED ORDER — FENTANYL CITRATE (PF) 100 MCG/2ML IJ SOLN
INTRAMUSCULAR | Status: AC
  Filled 2019-11-18: qty 2

## 2019-11-18 MED ORDER — OXYCODONE-ACETAMINOPHEN 5-325 MG PO TABS
1.0000 | ORAL_TABLET | Freq: Once | ORAL | Status: AC
  Administered 2019-11-18: 1 via ORAL

## 2019-11-18 MED ORDER — MIDAZOLAM HCL 2 MG/2ML IJ SOLN
INTRAMUSCULAR | Status: DC | PRN
  Administered 2019-11-18: 2 mg via INTRAVENOUS

## 2019-11-18 MED ORDER — PHENYLEPHRINE HCL-NACL 10-0.9 MG/250ML-% IV SOLN
INTRAVENOUS | Status: DC | PRN
  Administered 2019-11-18: 20 ug/min via INTRAVENOUS

## 2019-11-18 MED ORDER — ACETAMINOPHEN 500 MG PO TABS
1000.0000 mg | ORAL_TABLET | Freq: Once | ORAL | Status: AC
  Administered 2019-11-18: 1000 mg via ORAL
  Filled 2019-11-18: qty 2

## 2019-11-18 MED ORDER — SODIUM CHLORIDE 0.9 % IR SOLN
Status: DC | PRN
  Administered 2019-11-18: 1000 mL

## 2019-11-18 MED ORDER — INSULIN ASPART 100 UNIT/ML ~~LOC~~ SOLN: 8.0000 [IU] | Freq: Once | SUBCUTANEOUS | Status: AC

## 2019-11-18 MED ORDER — PROPOFOL 10 MG/ML IV BOLUS
INTRAVENOUS | Status: AC
  Filled 2019-11-18: qty 20

## 2019-11-18 MED ORDER — LACTATED RINGERS IV SOLN: INTRAVENOUS | Status: DC | PRN

## 2019-11-18 MED ORDER — PROPOFOL 10 MG/ML IV BOLUS
INTRAVENOUS | Status: DC | PRN
  Administered 2019-11-18: 120 mg via INTRAVENOUS

## 2019-11-18 MED ORDER — ONDANSETRON HCL 4 MG/2ML IJ SOLN: 4.0000 mg | Freq: Once | INTRAMUSCULAR | Status: AC

## 2019-11-18 MED ORDER — PHENYLEPHRINE 40 MCG/ML (10ML) SYRINGE FOR IV PUSH (FOR BLOOD PRESSURE SUPPORT)
PREFILLED_SYRINGE | INTRAVENOUS | Status: DC | PRN
  Administered 2019-11-18: 120 ug via INTRAVENOUS

## 2019-11-18 MED ORDER — ONDANSETRON HCL 4 MG/2ML IJ SOLN
INTRAMUSCULAR | Status: AC
  Administered 2019-11-18: 4 mg
  Filled 2019-11-18: qty 2

## 2019-11-18 MED ORDER — LIDOCAINE 2% (20 MG/ML) 5 ML SYRINGE
INTRAMUSCULAR | Status: DC | PRN
  Administered 2019-11-18: 20 mg via INTRAVENOUS

## 2019-11-18 MED ORDER — DEXAMETHASONE SODIUM PHOSPHATE 10 MG/ML IJ SOLN
INTRAMUSCULAR | Status: AC
  Filled 2019-11-18: qty 1

## 2019-11-18 MED ORDER — INSULIN ASPART 100 UNIT/ML ~~LOC~~ SOLN
4.0000 [IU] | Freq: Once | SUBCUTANEOUS | Status: AC
  Administered 2019-11-18: 4 [IU] via INTRAVENOUS

## 2019-11-18 MED ORDER — INSULIN ASPART 100 UNIT/ML ~~LOC~~ SOLN
SUBCUTANEOUS | Status: AC
  Administered 2019-11-18: 8 [IU] via INTRAVENOUS
  Filled 2019-11-18: qty 1

## 2019-11-18 MED ORDER — ONDANSETRON HCL 4 MG/2ML IJ SOLN
INTRAMUSCULAR | Status: AC
  Filled 2019-11-18: qty 2

## 2019-11-18 MED ORDER — INSULIN ASPART 100 UNIT/ML ~~LOC~~ SOLN
SUBCUTANEOUS | Status: AC
  Filled 2019-11-18: qty 1

## 2019-11-18 MED ORDER — LIDOCAINE 2% (20 MG/ML) 5 ML SYRINGE
INTRAMUSCULAR | Status: AC
  Filled 2019-11-18: qty 5

## 2019-11-18 MED ORDER — ROPIVACAINE HCL 5 MG/ML IJ SOLN
INTRAMUSCULAR | Status: DC | PRN
  Administered 2019-11-18: 20 mL via PERINEURAL
  Administered 2019-11-18: 10 mL via PERINEURAL

## 2019-11-18 MED ORDER — FENTANYL CITRATE (PF) 250 MCG/5ML IJ SOLN
INTRAMUSCULAR | Status: AC
  Filled 2019-11-18: qty 5

## 2019-11-18 MED ORDER — OXYCODONE-ACETAMINOPHEN 5-325 MG PO TABS: 1.0000 | ORAL_TABLET | ORAL | 0 refills | Status: DC | PRN

## 2019-11-18 MED ORDER — OXYCODONE-ACETAMINOPHEN 5-325 MG PO TABS
ORAL_TABLET | ORAL | Status: AC
  Filled 2019-11-18: qty 1

## 2019-11-18 MED ORDER — HYDRALAZINE HCL 20 MG/ML IJ SOLN
INTRAMUSCULAR | Status: AC
  Administered 2019-11-18: 10 mg
  Filled 2019-11-18: qty 1

## 2019-11-18 MED ORDER — MIDAZOLAM HCL 2 MG/2ML IJ SOLN
INTRAMUSCULAR | Status: AC
  Filled 2019-11-18: qty 2

## 2019-11-18 SURGICAL SUPPLY — 27 items
BLADE SAW RECIP 87.9 MT (BLADE) ×3 IMPLANT
BLADE SURG 21 STRL SS (BLADE) ×3 IMPLANT
COVER SURGICAL LIGHT HANDLE (MISCELLANEOUS) ×3 IMPLANT
DRAPE EXTREMITY T 121X128X90 (DISPOSABLE) ×3 IMPLANT
DRAPE HALF SHEET 40X57 (DRAPES) ×3 IMPLANT
DRAPE INCISE IOBAN 66X45 STRL (DRAPES) ×3 IMPLANT
DRAPE U-SHAPE 47X51 STRL (DRAPES) ×6 IMPLANT
DRESSING PEEL AND PLC PRVNA 13 (GAUZE/BANDAGES/DRESSINGS) ×1 IMPLANT
DRESSING PREVENA PLUS CUSTOM (GAUZE/BANDAGES/DRESSINGS) ×1 IMPLANT
DRSG PEEL AND PLACE PREVENA 13 (GAUZE/BANDAGES/DRESSINGS) ×3
DRSG PREVENA PLUS CUSTOM (GAUZE/BANDAGES/DRESSINGS) ×3
DURAPREP 26ML APPLICATOR (WOUND CARE) ×3 IMPLANT
ELECT REM PT RETURN 9FT ADLT (ELECTROSURGICAL) ×3
ELECTRODE REM PT RTRN 9FT ADLT (ELECTROSURGICAL) ×1 IMPLANT
GLOVE BIOGEL PI IND STRL 9 (GLOVE) ×1 IMPLANT
GLOVE BIOGEL PI INDICATOR 9 (GLOVE) ×2
GLOVE SURG ORTHO 9.0 STRL STRW (GLOVE) ×3 IMPLANT
GOWN STRL REUS W/ TWL XL LVL3 (GOWN DISPOSABLE) ×2 IMPLANT
GOWN STRL REUS W/TWL XL LVL3 (GOWN DISPOSABLE) ×4
KIT BASIN OR (CUSTOM PROCEDURE TRAY) ×3 IMPLANT
KIT TURNOVER KIT B (KITS) ×3 IMPLANT
MANIFOLD NEPTUNE II (INSTRUMENTS) ×3 IMPLANT
NS IRRIG 1000ML POUR BTL (IV SOLUTION) ×3 IMPLANT
PACK GENERAL/GYN (CUSTOM PROCEDURE TRAY) ×3 IMPLANT
PREVENA RESTOR ARTHOFORM 46X30 (CANNISTER) ×3 IMPLANT
SUT ETHILON 2 0 PSLX (SUTURE) ×6 IMPLANT
TOWEL GREEN STERILE (TOWEL DISPOSABLE) ×3 IMPLANT

## 2019-11-18 NOTE — Anesthesia Procedure Notes (Signed)
Procedure Name: LMA Insertion Date/Time: 11/18/2019 9:06 AM Performed by: Renato Shin, CRNA Pre-anesthesia Checklist: Patient identified, Emergency Drugs available, Suction available, Patient being monitored and Timeout performed Patient Re-evaluated:Patient Re-evaluated prior to induction Oxygen Delivery Method: Circle system utilized Preoxygenation: Pre-oxygenation with 100% oxygen Induction Type: IV induction LMA: LMA inserted LMA Size: 4.0 Number of attempts: 1 Placement Confirmation: breath sounds checked- equal and bilateral and positive ETCO2 Tube secured with: Tape Dental Injury: Teeth and Oropharynx as per pre-operative assessment

## 2019-11-18 NOTE — Anesthesia Procedure Notes (Signed)
Anesthesia Regional Block: Popliteal block   Pre-Anesthetic Checklist: ,, timeout performed, Correct Patient, Correct Site, Correct Laterality, Correct Procedure, Correct Position, site marked, Risks and benefits discussed,  Surgical consent,  Pre-op evaluation,  At surgeon's request and post-op pain management  Laterality: Left  Prep: Maximum Sterile Barrier Precautions used, chloraprep       Needles:  Injection technique: Single-shot  Needle Type: Echogenic Stimulator Needle     Needle Length: 9cm  Needle Gauge: 22     Additional Needles:   Procedures:,,,, ultrasound used (permanent image in chart),,,,  Narrative:  Start time: 11/18/2019 8:15 AM End time: 11/18/2019 8:25 AM Injection made incrementally with aspirations every 5 mL.  Performed by: Personally  Anesthesiologist: Freddrick March, MD  Additional Notes: Monitors applied. No increased pain on injection. No increased resistance to injection. Injection made in 5cc increments. Good needle visualization. Patient tolerated procedure well.

## 2019-11-18 NOTE — Discharge Instructions (Signed)
If wound vac alarms: cut off wound vac for 1 hour, then turn back on If wound vac does not stop alarming: keep wound vac off **Do NOT unhook the wound vac from the tubing**

## 2019-11-18 NOTE — H&P (Signed)
[name] is an 54 y.o. female.   Chief Complaint: Left Below Knee Amputation Wound dehiscence HPI: Patient is a 54 year old woman status post bilateral transtibial amputations.  She is currently wearing a prosthesis on the right.  Patient has had progressive wound dehiscence on the left with drainage black eschar and pain.  Patient is not on antibiotics.  She states a friend came over and put some caustic cleaning solution on the wound.   Past Medical History:  Diagnosis Date  . Abscess of great toe, right   . Acute on chronic diastolic CHF (congestive heart failure) ([place]) 06/24/2018  . Acute osteomyelitis of toe, left ([place]) 09/05/2016  . Amputation of right great toe ([place]) 12/22/2018  . Anxiety   . Cataract    left - surgery to remove  . Cellulitis of foot, right 12/11/2018  . COMMON MIGRAINE 10/07/2010  . Coronary artery disease   . Decreased visual acuity 11/10/2016  . Depression 12/18/2012  . Diabetes mellitus type II, uncontrolled ([place]) 10/07/2010   Qualifier: Diagnosis of  By: Charlett Blake MD, [name]    . Diabetic foot infection ([place]) 08/26/2016  . Diabetic infection of right foot ([place])   . Disturbance of skin sensation 10/07/2010  . Gangrene of right foot ([place])   . GERD (gastroesophageal reflux disease)   . Heart murmur   . History of kidney stones    "years ago" - passed stones  . Hyperlipidemia 12/06/2010  . Hypertension   . Hypothyroidism   . Lipoma of abdominal wall 10/05/2016  . Overweight(278.02) 12/06/2010  . PERIPHERAL NEUROPATHY, FEET 10/07/2010   11/17/2019  . PVD (peripheral vascular disease) ([place]) 01/21/2012  . RESTLESS LEG SYNDROME 10/25/2010  . Stroke [facility]) 2014, 2017   most recently in 2/17 - intracerebral hemorrhage    Past Surgical History:  Procedure Laterality Date  . AMPUTATION Right 12/13/2018   Procedure: AMPUTATION RIGHT GREAT TOE, LOCAL RELOCATION OF TISSUE FOR WOUND CLOSURE 9cm x 3cm, VAC APPLICATION;  Surgeon: [name], MD;  Location: [place];   Service: Orthopedics;  Laterality: Right;  . AMPUTATION Right 12/31/2018   Procedure: RIGHT BELOW KNEE AMPUTATION;  Surgeon: [name], MD;  Location: [place];  Service: Orthopedics;  Laterality: Right;  . AMPUTATION Left 09/21/2019   Procedure: LEFT BELOW KNEE AMPUTATION;  Surgeon: [name], MD;  Location: [place];  Service: Orthopedics;  Laterality: Left;  . AMPUTATION TOE Left 09/09/2016   Procedure: AMPUTATION OF LEFT GREAT TOE;  Surgeon: Milly Jakob, MD;  Location: [place];  Service: Orthopedics;  Laterality: Left;  . BELOW KNEE LEG AMPUTATION Left 09/21/2019  . CARDIAC CATHETERIZATION  06/24/2018  . CORONARY ARTERY BYPASS GRAFT N/A 06/28/2018   Procedure: CORONARY ARTERY BYPASS GRAFTING (CABG) times  four, using left internal mammary artery, endoscopically harvested right saphenous vein, and harvested left radial artery;  Surgeon: [name], MD;  Location: [place];  Service: Open Heart Surgery;  Laterality: N/A;  . ENDOVEIN HARVEST OF GREATER SAPHENOUS VEIN Right 06/28/2018   Procedure: ENDOVEIN HARVEST OF GREATER SAPHENOUS VEIN;  Surgeon: [name], MD;  Location: [place];  Service: Open Heart Surgery;  Laterality: Right;  . EYE SURGERY Left 06/2017   [facility]  . LEFT HEART CATH AND CORONARY ANGIOGRAPHY N/A 06/24/2018   Procedure: LEFT HEART CATH AND CORONARY ANGIOGRAPHY;  Surgeon: Troy Sine, MD;  Location: [place] [facility];  Service: Cardiovascular;  Laterality: N/A;  . RADIAL ARTERY HARVEST Left 06/28/2018  Procedure: RADIAL ARTERY HARVEST;  Surgeon: [name], MD;  Location: [place];  Service: Open Heart Surgery;  Laterality: Left;  . TEE WITHOUT CARDIOVERSION N/A 06/28/2018   Procedure: TRANSESOPHAGEAL ECHOCARDIOGRAM (TEE);  Surgeon: [name], MD;  Location: [place];  Service: Open Heart Surgery;  Laterality: N/A;  . WISDOM TOOTH EXTRACTION      Family History  Problem Relation Age of Onset  . Arthritis Mother    . Stroke Brother        previous smoker  . Alcohol abuse Brother        in remission  . Leukemia Brother   . Diabetes Paternal Grandmother   . Healthy Son    Social History:  reports that she quit smoking about 14 years ago. She smoked 0.50 packs per day. She has never used smokeless tobacco. She reports previous drug use. She reports that she does not drink alcohol.  Allergies:  Allergies  Allergen Reactions  . Lyrica [Pregabalin] Other (See Comments)    MADE PATIENT VERY EMOTIONAL AND WOULD CRY EASILY  . Morphine And Related Nausea And Vomiting  . Penicillins Other (See Comments)    "Made patient feel sick" Did it involve swelling of the face/tongue/throat, SOB, or low BP? No Did it involve sudden or severe rash/hives, skin peeling, or any reaction on the inside of your mouth or nose? No Did you need to seek medical attention at a hospital or doctor's office? No When did it last happen?April 2020 If all above answers are "NO", may proceed with cephalosporin use.   . Tramadol Nausea And Vomiting    Pt cant tolerate this pain med.     No medications prior to admission.    Results for orders placed or performed during the hospital encounter of 11/16/19 (from the past 48 hour(s))  SARS CORONAVIRUS 2 (TAT 6-24 HRS) Nasopharyngeal Nasopharyngeal Swab     Status: None   Collection Time: 11/16/19 10:41 AM   Specimen: Nasopharyngeal Swab  Result Value Ref Range   SARS Coronavirus 2 NEGATIVE NEGATIVE    Comment: (NOTE) SARS-CoV-2 target nucleic acids are NOT DETECTED. The SARS-CoV-2 RNA is generally detectable in upper and lower respiratory specimens during the acute phase of infection. Negative results do not preclude SARS-CoV-2 infection, do not rule out co-infections with other pathogens, and should not be used as the sole basis for treatment or other patient management decisions. Negative results must be combined with clinical observations, patient history, and  epidemiological information. The expected result is Negative. Fact Sheet for Patients: SugarRoll.be Fact Sheet for Healthcare Providers: https://www.woods-mathews.com/ This test is not yet approved or cleared by the [place] FDA and  has been authorized for detection and/or diagnosis of SARS-CoV-2 by FDA under an Emergency Use Authorization (EUA). This EUA will remain  in effect (meaning this test can be used) for the duration of the COVID-19 declaration under Section 56 4(b)(1) of the Act, 21 U.S.C. section 360bbb-3(b)(1), unless the authorization is terminated or revoked sooner. Performed at [facility], [place] [address] [place], [place] [address]    No results found.  Review of Systems  All other systems reviewed and are negative.   Last menstrual period 02/12/2011. Physical Exam  Patient is alert, oriented, no adenopathy, well-dressed, normal affect, normal respiratory effort. Lungs clear Heart RRR Examination patient has progressive gangrenous changes of the left transtibial amputation there is thin black eschar there is no cellulitis no odor no drainage no signs of infection.  Assessment/Plan 1. Acquired absence of left leg below knee (HCC)   2. Acquired absence of right leg below knee (HCC)   3. Dehiscence of amputation stump (HCC)     Plan: Due to the progressive gangrenous changes we will plan for revision of the left transtibial amputation.  Risks and benefits were discussed including risk of the wound not healing.  Patient states she would like to proceed with surgery and would like to wait until Friday.   Bevely Palmer Etosha Wetherell, PA 11/18/2019, 4:40 AM

## 2019-11-18 NOTE — Transfer of Care (Signed)
Immediate Anesthesia Transfer of Care Note  Patient: [name]  Procedure(s) Performed: REVISION LEFT BELOW KNEE AMPUTATION (Left )  Patient Location: PACU  Anesthesia Type:GA combined with regional for post-op pain  Level of Consciousness: awake and patient cooperative  Airway & Oxygen Therapy: Patient Spontanous Breathing and Patient connected to nasal cannula oxygen  Post-op Assessment: Report given to RN, Post -op Vital signs reviewed and stable and pt hypertensive. Dr Lanetta Inch is aware.  Post vital signs: Reviewed and stable  Last Vitals:  Vitals Value Taken Time  BP 201/108 11/18/19 0956  Temp 36.7 C 11/18/19 0956  Pulse 100 11/18/19 1000  Resp 15 11/18/19 1000  SpO2 100 % 11/18/19 1000  Vitals shown include unvalidated device data.  Last Pain:  Vitals:   11/18/19 0657  TempSrc:   PainSc: 0-No pain         Complications: No apparent anesthesia complications

## 2019-11-18 NOTE — Op Note (Signed)
11/18/2019  9:42 AM  PATIENT:  [name]    PRE-OPERATIVE DIAGNOSIS:  Dehiscence Left Below Knee Amputation  POST-OPERATIVE DIAGNOSIS:  Same  PROCEDURE:  REVISION LEFT BELOW KNEE AMPUTATION Application of Prevena and Arthur form wound VAC  SURGEON:  [name], MD  PHYSICIAN ASSISTANT:None ANESTHESIA:   General  PREOPERATIVE INDICATIONS:  [name] is a  54 y.o. female with a diagnosis of Dehiscence Left Below Knee Amputation who failed conservative measures and elected for surgical management.    The risks benefits and alternatives were discussed with the patient preoperatively including but not limited to the risks of infection, bleeding, nerve injury, cardiopulmonary complications, the need for revision surgery, among others, and the patient was willing to proceed.  OPERATIVE IMPLANTS: Praveena dressing  @ENCIMAGES @  OPERATIVE FINDINGS: Amputation resected back to healthy viable muscle and bone.  OPERATIVE PROCEDURE: Patient was brought the operating general anesthetic.  After adequate levels anesthesia were obtained patient's left lower extremity was prepped using DuraPrep draped into a sterile field a timeout was called.  A fishmouth incision was made around the ulcerative necrotic wound this is carried through healthy viable tissue down to bone the distal centimeter of the tibia and fibula were resected beveled anteriorly.  The remaining muscle had good color good contractility and good consistency.  Electrocautery was used hemostasis.  The deep and superficial fascia layers and skin was used to close the transtibial amputation.  The  Praveena customizable and Arnell Sieving form wound VAC were applied this had a good suction fit patient was extubated taken to the PACU in stable condition.   DISCHARGE PLANNING:  Antibiotic duration: Preoperative antibiotics  Weightbearing: Nonweightbearing on the left  Pain medication: Prescription for Percocet  Dressing care/  Wound VAC: Continue wound VAC for 1 week  Ambulatory devices: walker  Discharge to: Home  Follow-up: In the office 1 week post operative.

## 2019-11-18 NOTE — Anesthesia Postprocedure Evaluation (Signed)
Anesthesia Post Note  Patient: [name]  Procedure(s) Performed: REVISION LEFT BELOW KNEE AMPUTATION (Left )     Patient location during evaluation: PACU Anesthesia Type: Regional and General Level of consciousness: awake and alert Pain management: pain level controlled Vital Signs Assessment: post-procedure vital signs reviewed and stable Respiratory status: spontaneous breathing, nonlabored ventilation, respiratory function stable and patient connected to nasal cannula oxygen Cardiovascular status: blood pressure returned to baseline and stable Postop Assessment: no apparent nausea or vomiting Anesthetic complications: no    Last Vitals:  Vitals:   11/18/19 1427 11/18/19 1428  BP: 113/68 113/68  Pulse: 79 87  Resp: 12 16  Temp:    SpO2:  98%    Last Pain:  Vitals:   11/18/19 1428  TempSrc:   PainSc: 0-No pain                 Araeya Lamb L Jenita Rayfield

## 2019-11-18 NOTE — Progress Notes (Signed)
Dr. Lanetta Inch notified of patient's elevated CBG and BP. No orders given will continue to monitor. Reported off to assigned RN.

## 2019-11-18 NOTE — Anesthesia Procedure Notes (Signed)
Anesthesia Regional Block: Adductor canal block   Pre-Anesthetic Checklist: ,, timeout performed, Correct Patient, Correct Site, Correct Laterality, Correct Procedure, Correct Position, site marked, Risks and benefits discussed,  Surgical consent,  Pre-op evaluation,  At surgeon's request and post-op pain management  Laterality: Left  Prep: Maximum Sterile Barrier Precautions used, chloraprep       Needles:  Injection technique: Single-shot  Needle Type: Echogenic Stimulator Needle     Needle Length: 9cm  Needle Gauge: 22     Additional Needles:   Procedures:,,,, ultrasound used (permanent image in chart),,,,  Narrative:  Start time: 11/18/2019 8:26 AM End time: 11/18/2019 8:31 AM Injection made incrementally with aspirations every 5 mL.  Performed by: Personally  Anesthesiologist: Freddrick March, MD  Additional Notes: Monitors applied. No increased pain on injection. No increased resistance to injection. Injection made in 5cc increments. Good needle visualization. Patient tolerated procedure well.

## 2019-11-21 ENCOUNTER — Other Ambulatory Visit: Payer: Self-pay

## 2019-11-21 ENCOUNTER — Encounter: Payer: Self-pay | Admitting: Physician Assistant

## 2019-11-21 ENCOUNTER — Ambulatory Visit (INDEPENDENT_AMBULATORY_CARE_PROVIDER_SITE_OTHER): Payer: BC Managed Care – PPO | Admitting: Physician Assistant

## 2019-11-21 DIAGNOSIS — T8781 Dehiscence of amputation stump: Secondary | ICD-10-CM

## 2019-11-21 NOTE — Progress Notes (Signed)
Office Visit Note   Patient: [name]           Date of Birth: Sep 14, 1965           MRN: [id number] Visit Date: 11/21/2019              Requested by: Mosie Lukes, MD [address] [place],  [place] [address] PCP: Mosie Lukes, MD  Chief Complaint  Patient presents with  . Left Knee - Routine Post Op      HPI: Patient presents today for follow-up on her left below-knee amputation revision.  She is here for wound VAC removal.  She is overall doing okay.  Home health is visiting with her twice a week  Assessment & Plan: Visit Diagnoses: No diagnosis found.  Plan: Home health may do dry dressing changes.  She should keep the incision clean and dry.  Follow-up in 1 week  Follow-Up Instructions: No follow-ups on file.   Ortho Exam  Patient is alert, oriented, no adenopathy, well-dressed, normal affect, normal respiratory effort. Wound VAC was removed.  She has healthy bleeding and healthy wound edges throughout the incision.  No evidence of dehiscence.  Swelling is well controlled.  No self cellulitis or foul odor  Imaging: No results found. No images are attached to the encounter.  Labs: Lab Results  Component Value Date   HGBA1C 14.1 (H) 09/21/2019   HGBA1C 10.5 (H) 12/12/2018   HGBA1C 11.1 (H) 06/26/2018   ESRSEDRATE 130 (H) 12/12/2018   ESRSEDRATE 115 (H) 06/24/2018   ESRSEDRATE 107 (H) 09/04/2016   CRP 28.2 (H) 12/12/2018   CRP 7.2 (H) 09/04/2016   CRP 0.7 03/21/2015   LABURIC 3.2 06/13/2014   LABURIC 3.1 02/09/2014   REPTSTATUS 10/08/2019 FINAL 10/06/2019   GRAMSTAIN  12/13/2018    FEW WBC PRESENT,BOTH PMN AND MONONUCLEAR NO ORGANISMS SEEN    CULT >=100,000 COLONIES/mL CITROBACTER FREUNDII (A) 10/06/2019   LABORGA CITROBACTER FREUNDII (A) 10/06/2019     Lab Results  Component Value Date   ALBUMIN 2.2 (L) 10/11/2019   ALBUMIN 2.1 (L) 09/28/2019   ALBUMIN 1.8 (L) 01/06/2019   PREALBUMIN 12.7 (L) 09/04/2016   LABURIC 3.2  06/13/2014   LABURIC 3.1 02/09/2014    Lab Results  Component Value Date   MG 2.1 12/16/2018   MG 2.8 (H) 06/29/2018   MG 3.0 (H) 06/29/2018   No results found for: VD25OH  Lab Results  Component Value Date   PREALBUMIN 12.7 (L) 09/04/2016   CBC EXTENDED Latest Ref Rng & Units 10/11/2019 09/28/2019 09/21/2019  WBC 4.0 - 10.5 K/uL 8.3 9.6 10.4  RBC 3.87 - 5.11 MIL/uL 3.52(L) 3.98 4.25  HGB 12.0 - 15.0 g/dL 9.5(L) 10.7(L) 11.7(L)  HCT 36.0 - 46.0 % 29.8(L) 34.4(L) 37.1  PLT 150 - 400 K/uL 654(H) 640(H) 755(H)  NEUTROABS 1.7 - 7.7 K/uL 4.5 7.1 -  LYMPHSABS 0.7 - 4.0 K/uL 2.5 1.4 -     There is no height or weight on file to calculate BMI.  Orders:  No orders of the defined types were placed in this encounter.  No orders of the defined types were placed in this encounter.    Procedures: No procedures performed  Clinical Data: No additional findings.  ROS:  All other systems negative, except as noted in the HPI. Review of Systems  Objective: Vital Signs: LMP 02/12/2011 (Exact Date)   Specialty Comments:  No specialty comments available.  PMFS History: Patient Active  Problem List   Diagnosis Date Noted  . Dehiscence of amputation stump ([place])   . Adjustment disorder with mixed anxiety and depressed mood   . S/P bilateral BKA (below knee amputation) ([place]) 09/27/2019  . Acute osteomyelitis of left foot ([place]) 09/21/2019  . Subacute osteomyelitis, left ankle and foot ([place])   . Pressure injury of left heel, unstageable ([place]) 02/09/2019  . Hypoglycemia   . Diabetes mellitus type 2 in nonobese (HCC)   . Labile blood pressure   . Labile blood glucose   . Phantom limb pain ([place])   . CKD (chronic kidney disease), stage III   . Hypothyroidism   . Essential hypertension   . Chronic diastolic congestive heart failure ([place])   . Coronary artery disease involving coronary bypass graft of native heart without angina pectoris   . Poorly controlled type 2 diabetes mellitus  with peripheral neuropathy ([place])   . Acute on chronic anemia   . History of below knee amputation, right ([place]) 12/31/2018  . S/P CABG x 3 06/28/2018  . Coronary artery disease involving native coronary artery of native heart with angina pectoris ([place]) 06/25/2018  . Type 2 diabetes mellitus with vascular disease ([place]) 06/24/2018  . Acute on chronic diastolic CHF (congestive heart failure) ([place]) 06/24/2018  . Non-ST elevation (NSTEMI) myocardial infarction ([place]) 06/24/2018  . Chest pain 06/23/2018  . Glaucoma 09/17/2017  . Diabetic retinopathy ([place]) 09/17/2017  . Otitis media 11/10/2016  . Decreased visual acuity 11/10/2016  . Spinal stenosis, lumbar region with neurogenic claudication 10/22/2016  . Other spondylosis with radiculopathy, lumbar region 10/13/2016  . Lipoma of abdominal wall 10/05/2016  . Severe protein-calorie malnutrition ([place]) 09/04/2016  . Anemia of chronic disease 09/04/2016  . Diabetic polyneuropathy associated with type 2 diabetes mellitus ([place]) 05/07/2016  . Orthostatic hypotension 05/07/2016  . Cerebrovascular disease 04/27/2016  . N&V (nausea and vomiting) 04/01/2016  . ICH (intracerebral hemorrhage) ([place]) 03/30/2016  . Visit for preventive health examination 05/21/2015  . Breast cancer screening 05/21/2015  . Arm lesion 07/02/2014  . Pain in limb 01/13/2014  . Neuropathic pain of both legs 01/13/2014  . Hip pain 01/12/2014  . Allergic rhinitis 03/10/2013  . Back pain 03/08/2013  . Depression 12/18/2012  . PVD (peripheral vascular disease) ([place]) 01/21/2012  . Hyperlipidemia 12/06/2010  . Overweight(278.02) 12/06/2010  . RESTLESS LEG SYNDROME 10/25/2010  . Migraine without aura 10/07/2010  . Hereditary and idiopathic peripheral neuropathy 10/07/2010  . Essential hypertension, benign 10/07/2010  . DISTURBANCE OF SKIN SENSATION 10/07/2010  . HEART MURMUR, HX OF 10/07/2010  . NEPHROLITHIASIS, HX OF 10/07/2010   Past Medical History:  Diagnosis Date  .  Abscess of great toe, right   . Acute on chronic diastolic CHF (congestive heart failure) ([place]) 06/24/2018  . Acute osteomyelitis of toe, left ([place]) 09/05/2016  . Amputation of right great toe ([place]) 12/22/2018  . Anxiety   . Cataract    left - surgery to remove  . Cellulitis of foot, right 12/11/2018  . COMMON MIGRAINE 10/07/2010  . Coronary artery disease   . Decreased visual acuity 11/10/2016  . Depression 12/18/2012  . Diabetes mellitus type II, uncontrolled ([place]) 10/07/2010   Qualifier: Diagnosis of  By: Charlett Blake MD, [name]    . Diabetic foot infection ([place]) 08/26/2016  . Diabetic infection of right foot ([place])   . Disturbance of skin sensation 10/07/2010  . Gangrene of right foot ([place])   . GERD (gastroesophageal reflux disease)   . Heart murmur   .  History of kidney stones    "years ago" - passed stones  . Hyperlipidemia 12/06/2010  . Hypertension   . Hypothyroidism   . Lipoma of abdominal wall 10/05/2016  . Overweight(278.02) 12/06/2010  . PERIPHERAL NEUROPATHY, FEET 10/07/2010   11/17/2019  . PVD (peripheral vascular disease) ([place]) 01/21/2012  . RESTLESS LEG SYNDROME 10/25/2010  . Stroke [facility]) 2014, 2017   most recently in 2/17 - intracerebral hemorrhage    Family History  Problem Relation Age of Onset  . Arthritis Mother   . Stroke Brother        previous smoker  . Alcohol abuse Brother        in remission  . Leukemia Brother   . Diabetes Paternal Grandmother   . Healthy Son     Past Surgical History:  Procedure Laterality Date  . AMPUTATION Right 12/13/2018   Procedure: AMPUTATION RIGHT GREAT TOE, LOCAL RELOCATION OF TISSUE FOR WOUND CLOSURE 9cm x 3cm, VAC APPLICATION;  Surgeon: [name], MD;  Location: [facility];  Service: Orthopedics;  Laterality: Right;  . AMPUTATION Right 12/31/2018   Procedure: RIGHT BELOW KNEE AMPUTATION;  Surgeon: [name], MD;  Location: [facility];  Service: Orthopedics;  Laterality: Right;  . AMPUTATION Left 09/21/2019   Procedure: LEFT BELOW KNEE  AMPUTATION;  Surgeon: [name], MD;  Location: [facility];  Service: Orthopedics;  Laterality: Left;  . AMPUTATION TOE Left 09/09/2016   Procedure: AMPUTATION OF LEFT GREAT TOE;  Surgeon: Milly Jakob, MD;  Location: [facility];  Service: Orthopedics;  Laterality: Left;  . BELOW KNEE LEG AMPUTATION Left 09/21/2019  . CARDIAC CATHETERIZATION  06/24/2018  . CORONARY ARTERY BYPASS GRAFT N/A 06/28/2018   Procedure: CORONARY ARTERY BYPASS GRAFTING (CABG) times  four, using left internal mammary artery, endoscopically harvested right saphenous vein, and harvested left radial artery;  Surgeon: [name], MD;  Location: [facility];  Service: Open Heart Surgery;  Laterality: N/A;  . ENDOVEIN HARVEST OF GREATER SAPHENOUS VEIN Right 06/28/2018   Procedure: ENDOVEIN HARVEST OF GREATER SAPHENOUS VEIN;  Surgeon: [name], MD;  Location: [facility];  Service: Open Heart Surgery;  Laterality: Right;  . EYE SURGERY Left 06/2017   [facility]  . LEFT HEART CATH AND CORONARY ANGIOGRAPHY N/A 06/24/2018   Procedure: LEFT HEART CATH AND CORONARY ANGIOGRAPHY;  Surgeon: Troy Sine, MD;  Location: [facility];  Service: Cardiovascular;  Laterality: N/A;  . RADIAL ARTERY HARVEST Left 06/28/2018   Procedure: RADIAL ARTERY HARVEST;  Surgeon: [name], MD;  Location: [facility];  Service: Open Heart Surgery;  Laterality: Left;  . STUMP REVISION Left 11/18/2019   Procedure: REVISION LEFT BELOW KNEE AMPUTATION;  Surgeon: [name], MD;  Location: [facility];  Service: Orthopedics;  Laterality: Left;  . TEE WITHOUT CARDIOVERSION N/A 06/28/2018   Procedure: TRANSESOPHAGEAL ECHOCARDIOGRAM (TEE);  Surgeon: [name], MD;  Location: [facility];  Service: Open Heart Surgery;  Laterality: N/A;  . WISDOM TOOTH EXTRACTION     Social History   Occupational History  . Occupation: Glass blower/designer  Tobacco Use  . Smoking status: Former Smoker    Packs/day: 0.50    Quit date:  09/08/2005    Years since quitting: 14.2  . Smokeless tobacco: Never Used  . Tobacco comment: " 1 smoke a week in the past"  Substance and Sexual Activity  . Alcohol use: No    Alcohol/week: 0.0 standard drinks  . Drug use: Not Currently  .  Sexual activity: Yes    Partners: Male    Birth control/protection: Post-menopausal

## 2019-11-22 ENCOUNTER — Telehealth: Payer: Self-pay | Admitting: Orthopedic Surgery

## 2019-11-22 DIAGNOSIS — D631 Anemia in chronic kidney disease: Secondary | ICD-10-CM | POA: Diagnosis not present

## 2019-11-22 DIAGNOSIS — E785 Hyperlipidemia, unspecified: Secondary | ICD-10-CM | POA: Diagnosis not present

## 2019-11-22 DIAGNOSIS — I130 Hypertensive heart and chronic kidney disease with heart failure and stage 1 through stage 4 chronic kidney disease, or unspecified chronic kidney disease: Secondary | ICD-10-CM | POA: Diagnosis not present

## 2019-11-22 DIAGNOSIS — E1151 Type 2 diabetes mellitus with diabetic peripheral angiopathy without gangrene: Secondary | ICD-10-CM | POA: Diagnosis not present

## 2019-11-22 DIAGNOSIS — G546 Phantom limb syndrome with pain: Secondary | ICD-10-CM | POA: Diagnosis not present

## 2019-11-22 DIAGNOSIS — M48062 Spinal stenosis, lumbar region with neurogenic claudication: Secondary | ICD-10-CM | POA: Diagnosis not present

## 2019-11-22 DIAGNOSIS — E1142 Type 2 diabetes mellitus with diabetic polyneuropathy: Secondary | ICD-10-CM | POA: Diagnosis not present

## 2019-11-22 DIAGNOSIS — I2510 Atherosclerotic heart disease of native coronary artery without angina pectoris: Secondary | ICD-10-CM | POA: Diagnosis not present

## 2019-11-22 DIAGNOSIS — E039 Hypothyroidism, unspecified: Secondary | ICD-10-CM | POA: Diagnosis not present

## 2019-11-22 DIAGNOSIS — F329 Major depressive disorder, single episode, unspecified: Secondary | ICD-10-CM | POA: Diagnosis not present

## 2019-11-22 DIAGNOSIS — E1122 Type 2 diabetes mellitus with diabetic chronic kidney disease: Secondary | ICD-10-CM | POA: Diagnosis not present

## 2019-11-22 DIAGNOSIS — M4726 Other spondylosis with radiculopathy, lumbar region: Secondary | ICD-10-CM | POA: Diagnosis not present

## 2019-11-22 DIAGNOSIS — I5032 Chronic diastolic (congestive) heart failure: Secondary | ICD-10-CM | POA: Diagnosis not present

## 2019-11-22 DIAGNOSIS — E11319 Type 2 diabetes mellitus with unspecified diabetic retinopathy without macular edema: Secondary | ICD-10-CM | POA: Diagnosis not present

## 2019-11-22 DIAGNOSIS — Z4781 Encounter for orthopedic aftercare following surgical amputation: Secondary | ICD-10-CM | POA: Diagnosis not present

## 2019-11-22 DIAGNOSIS — N183 Chronic kidney disease, stage 3 unspecified: Secondary | ICD-10-CM | POA: Diagnosis not present

## 2019-11-22 NOTE — Telephone Encounter (Signed)
Pt called in stating she had surgery on 11/18/19 and wanted to know when it would be ok to change the bandages. Pt would like a call back with this info.   [phone]

## 2019-11-22 NOTE — Telephone Encounter (Signed)
Called pt and advised that she should change her dressing daily. A dry dressing change and call with any questions. Otherwise keep appt for 11/28/19

## 2019-11-23 ENCOUNTER — Other Ambulatory Visit: Payer: Self-pay

## 2019-11-23 ENCOUNTER — Telehealth: Payer: Self-pay | Admitting: Family Medicine

## 2019-11-23 ENCOUNTER — Encounter: Payer: Self-pay | Admitting: Family

## 2019-11-23 ENCOUNTER — Telehealth: Payer: Self-pay

## 2019-11-23 ENCOUNTER — Ambulatory Visit (INDEPENDENT_AMBULATORY_CARE_PROVIDER_SITE_OTHER): Payer: BC Managed Care – PPO | Admitting: Family

## 2019-11-23 VITALS — Ht 62.0 in | Wt 132.0 lb

## 2019-11-23 DIAGNOSIS — Z89512 Acquired absence of left leg below knee: Secondary | ICD-10-CM

## 2019-11-23 NOTE — Progress Notes (Signed)
Post-Op Visit Note   Patient: [name]           Date of Birth: 1965-10-28           MRN: [id number] Visit Date: 11/23/2019 PCP: Mosie Lukes, MD  Chief Complaint:  Chief Complaint  Patient presents with  . Left Leg - Routine Post Op    11/18/19 Revision left BKA     HPI:  HPI The patient is a 54 year old woman seen today status post left below-knee amputation revision Home health was concerned for bleeding after removing her wound VAC yesterday. Ortho Exam On examination of the left below-knee amputation sutures in place there is no gaping no active drainage minimal swelling there is no erythema no sign of infection  Visit Diagnoses:  1. Acquired absence of left leg below knee (HCC)     Plan: Continue with daily Dial soap cleansing.  Dry dressing changes.  Shrinker over this.  She will follow-up in 1 week.  Follow-Up Instructions: Return in about 1 week (around 11/30/2019).   Imaging: No results found.  Orders:  No orders of the defined types were placed in this encounter.  No orders of the defined types were placed in this encounter.    PMFS History: Patient Active Problem List   Diagnosis Date Noted  . Dehiscence of amputation stump ([place])   . Adjustment disorder with mixed anxiety and depressed mood   . S/P bilateral BKA (below knee amputation) ([place]) 09/27/2019  . Acute osteomyelitis of left foot ([place]) 09/21/2019  . Subacute osteomyelitis, left ankle and foot ([place])   . Pressure injury of left heel, unstageable ([place]) 02/09/2019  . Hypoglycemia   . Diabetes mellitus type 2 in nonobese (HCC)   . Labile blood pressure   . Labile blood glucose   . Phantom limb pain ([place])   . CKD (chronic kidney disease), stage III   . Hypothyroidism   . Essential hypertension   . Chronic diastolic congestive heart failure ([place])   . Coronary artery disease involving coronary bypass graft of native heart without angina pectoris   . Poorly controlled type 2 diabetes  mellitus with peripheral neuropathy ([place])   . Acute on chronic anemia   . History of below knee amputation, right ([place]) 12/31/2018  . S/P CABG x 3 06/28/2018  . Coronary artery disease involving native coronary artery of native heart with angina pectoris ([place]) 06/25/2018  . Type 2 diabetes mellitus with vascular disease ([place]) 06/24/2018  . Acute on chronic diastolic CHF (congestive heart failure) ([place]) 06/24/2018  . Non-ST elevation (NSTEMI) myocardial infarction ([place]) 06/24/2018  . Chest pain 06/23/2018  . Glaucoma 09/17/2017  . Diabetic retinopathy ([place]) 09/17/2017  . Otitis media 11/10/2016  . Decreased visual acuity 11/10/2016  . Spinal stenosis, lumbar region with neurogenic claudication 10/22/2016  . Other spondylosis with radiculopathy, lumbar region 10/13/2016  . Lipoma of abdominal wall 10/05/2016  . Severe protein-calorie malnutrition ([place]) 09/04/2016  . Anemia of chronic disease 09/04/2016  . Diabetic polyneuropathy associated with type 2 diabetes mellitus ([place]) 05/07/2016  . Orthostatic hypotension 05/07/2016  . Cerebrovascular disease 04/27/2016  . N&V (nausea and vomiting) 04/01/2016  . ICH (intracerebral hemorrhage) ([place]) 03/30/2016  . Visit for preventive health examination 05/21/2015  . Breast cancer screening 05/21/2015  . Arm lesion 07/02/2014  . Pain in limb 01/13/2014  . Neuropathic pain of both legs 01/13/2014  . Hip pain 01/12/2014  . Allergic rhinitis 03/10/2013  . Back pain 03/08/2013  . Depression  12/18/2012  . PVD (peripheral vascular disease) ([place]) 01/21/2012  . Hyperlipidemia 12/06/2010  . Overweight(278.02) 12/06/2010  . RESTLESS LEG SYNDROME 10/25/2010  . Migraine without aura 10/07/2010  . Hereditary and idiopathic peripheral neuropathy 10/07/2010  . Essential hypertension, benign 10/07/2010  . DISTURBANCE OF SKIN SENSATION 10/07/2010  . HEART MURMUR, HX OF 10/07/2010  . NEPHROLITHIASIS, HX OF 10/07/2010   Past Medical History:  Diagnosis  Date  . Abscess of great toe, right   . Acute on chronic diastolic CHF (congestive heart failure) ([place]) 06/24/2018  . Acute osteomyelitis of toe, left ([place]) 09/05/2016  . Amputation of right great toe ([place]) 12/22/2018  . Anxiety   . Cataract    left - surgery to remove  . Cellulitis of foot, right 12/11/2018  . COMMON MIGRAINE 10/07/2010  . Coronary artery disease   . Decreased visual acuity 11/10/2016  . Depression 12/18/2012  . Diabetes mellitus type II, uncontrolled ([place]) 10/07/2010   Qualifier: Diagnosis of  By: Charlett Blake MD, [name]    . Diabetic foot infection ([place]) 08/26/2016  . Diabetic infection of right foot ([place])   . Disturbance of skin sensation 10/07/2010  . Gangrene of right foot ([place])   . GERD (gastroesophageal reflux disease)   . Heart murmur   . History of kidney stones    "years ago" - passed stones  . Hyperlipidemia 12/06/2010  . Hypertension   . Hypothyroidism   . Lipoma of abdominal wall 10/05/2016  . Overweight(278.02) 12/06/2010  . PERIPHERAL NEUROPATHY, FEET 10/07/2010   11/17/2019  . PVD (peripheral vascular disease) ([place]) 01/21/2012  . RESTLESS LEG SYNDROME 10/25/2010  . Stroke [facility]) 2014, 2017   most recently in 2/17 - intracerebral hemorrhage    Family History  Problem Relation Age of Onset  . Arthritis Mother   . Stroke Brother        previous smoker  . Alcohol abuse Brother        in remission  . Leukemia Brother   . Diabetes Paternal Grandmother   . Healthy Son     Past Surgical History:  Procedure Laterality Date  . AMPUTATION Right 12/13/2018   Procedure: AMPUTATION RIGHT GREAT TOE, LOCAL RELOCATION OF TISSUE FOR WOUND CLOSURE 9cm x 3cm, VAC APPLICATION;  Surgeon: [name], MD;  Location: [facility];  Service: Orthopedics;  Laterality: Right;  . AMPUTATION Right 12/31/2018   Procedure: RIGHT BELOW KNEE AMPUTATION;  Surgeon: [name], MD;  Location: [facility];  Service: Orthopedics;  Laterality: Right;  . AMPUTATION Left 09/21/2019   Procedure: LEFT  BELOW KNEE AMPUTATION;  Surgeon: [name], MD;  Location: [facility];  Service: Orthopedics;  Laterality: Left;  . AMPUTATION TOE Left 09/09/2016   Procedure: AMPUTATION OF LEFT GREAT TOE;  Surgeon: Milly Jakob, MD;  Location: [facility];  Service: Orthopedics;  Laterality: Left;  . BELOW KNEE LEG AMPUTATION Left 09/21/2019  . CARDIAC CATHETERIZATION  06/24/2018  . CORONARY ARTERY BYPASS GRAFT N/A 06/28/2018   Procedure: CORONARY ARTERY BYPASS GRAFTING (CABG) times  four, using left internal mammary artery, endoscopically harvested right saphenous vein, and harvested left radial artery;  Surgeon: [name], MD;  Location: [facility];  Service: Open Heart Surgery;  Laterality: N/A;  . ENDOVEIN HARVEST OF GREATER SAPHENOUS VEIN Right 06/28/2018   Procedure: ENDOVEIN HARVEST OF GREATER SAPHENOUS VEIN;  Surgeon: [name], MD;  Location: [facility];  Service: Open Heart Surgery;  Laterality: Right;  . EYE SURGERY Left 06/2017  [facility]  . LEFT HEART CATH AND CORONARY ANGIOGRAPHY N/A 06/24/2018   Procedure: LEFT HEART CATH AND CORONARY ANGIOGRAPHY;  Surgeon: Troy Sine, MD;  Location: [facility];  Service: Cardiovascular;  Laterality: N/A;  . RADIAL ARTERY HARVEST Left 06/28/2018   Procedure: RADIAL ARTERY HARVEST;  Surgeon: [name], MD;  Location: [facility];  Service: Open Heart Surgery;  Laterality: Left;  . STUMP REVISION Left 11/18/2019   Procedure: REVISION LEFT BELOW KNEE AMPUTATION;  Surgeon: [name], MD;  Location: [facility];  Service: Orthopedics;  Laterality: Left;  . TEE WITHOUT CARDIOVERSION N/A 06/28/2018   Procedure: TRANSESOPHAGEAL ECHOCARDIOGRAM (TEE);  Surgeon: [name], MD;  Location: [facility];  Service: Open Heart Surgery;  Laterality: N/A;  . WISDOM TOOTH EXTRACTION     Social History   Occupational History  . Occupation: Glass blower/designer  Tobacco Use  . Smoking status: Former Smoker    Packs/day: 0.50    Quit  date: 09/08/2005    Years since quitting: 14.2  . Smokeless tobacco: Never Used  . Tobacco comment: " 1 smoke a week in the past"  Substance and Sexual Activity  . Alcohol use: No    Alcohol/week: 0.0 standard drinks  . Drug use: Not Currently  . Sexual activity: Yes    Partners: Male    Birth control/protection: Post-menopausal

## 2019-11-23 NOTE — Telephone Encounter (Signed)
Patient is requesting new diabetic testing supplies, system and test strips.   Please advise

## 2019-11-23 NOTE — Telephone Encounter (Signed)
I called pt to offer an appt today. She called on call doctor last night and advised that she was having increased drainage. I called and the pt did not answer and her mailbox is full. Will hold message and try again later.

## 2019-11-23 NOTE — Telephone Encounter (Signed)
Pt has an appt today at  3pm °

## 2019-11-24 ENCOUNTER — Telehealth: Payer: Self-pay | Admitting: Orthopedic Surgery

## 2019-11-24 ENCOUNTER — Other Ambulatory Visit: Payer: Self-pay

## 2019-11-24 NOTE — Telephone Encounter (Signed)
I called and sw pt to advise that she should wash the limb every day with soap and water and apply dry dressing. Needs some level of compression if it be ace bandage or shrinker. Pt to call with any questions.

## 2019-11-24 NOTE — Telephone Encounter (Signed)
Patient called wanting to know if she is suppose to change her dressing today or wait until her appointment on Monday.  CB#[phone].  Thank you.

## 2019-11-24 NOTE — Telephone Encounter (Signed)
She should test test tid and prn for labile blood sugars with hyperglycemia. Please send her in a new glucometer with lancets and test strips.

## 2019-11-24 NOTE — Telephone Encounter (Signed)
Please advise how often Pt should be testing.

## 2019-11-25 ENCOUNTER — Ambulatory Visit (INDEPENDENT_AMBULATORY_CARE_PROVIDER_SITE_OTHER): Payer: BC Managed Care – PPO | Admitting: Family Medicine

## 2019-11-25 ENCOUNTER — Other Ambulatory Visit: Payer: Self-pay

## 2019-11-25 ENCOUNTER — Telehealth: Payer: Self-pay | Admitting: Orthopedic Surgery

## 2019-11-25 ENCOUNTER — Encounter: Payer: Self-pay | Admitting: Family Medicine

## 2019-11-25 VITALS — BP 129/90 | HR 85 | Temp 97.9°F

## 2019-11-25 DIAGNOSIS — G546 Phantom limb syndrome with pain: Secondary | ICD-10-CM | POA: Diagnosis not present

## 2019-11-25 DIAGNOSIS — E1142 Type 2 diabetes mellitus with diabetic polyneuropathy: Secondary | ICD-10-CM | POA: Diagnosis not present

## 2019-11-25 DIAGNOSIS — N183 Chronic kidney disease, stage 3 unspecified: Secondary | ICD-10-CM | POA: Diagnosis not present

## 2019-11-25 DIAGNOSIS — E039 Hypothyroidism, unspecified: Secondary | ICD-10-CM | POA: Diagnosis not present

## 2019-11-25 DIAGNOSIS — M4726 Other spondylosis with radiculopathy, lumbar region: Secondary | ICD-10-CM | POA: Diagnosis not present

## 2019-11-25 DIAGNOSIS — E785 Hyperlipidemia, unspecified: Secondary | ICD-10-CM | POA: Diagnosis not present

## 2019-11-25 DIAGNOSIS — F329 Major depressive disorder, single episode, unspecified: Secondary | ICD-10-CM | POA: Diagnosis not present

## 2019-11-25 DIAGNOSIS — T8149XA Infection following a procedure, other surgical site, initial encounter: Secondary | ICD-10-CM | POA: Diagnosis not present

## 2019-11-25 DIAGNOSIS — I2510 Atherosclerotic heart disease of native coronary artery without angina pectoris: Secondary | ICD-10-CM | POA: Diagnosis not present

## 2019-11-25 DIAGNOSIS — I5032 Chronic diastolic (congestive) heart failure: Secondary | ICD-10-CM | POA: Diagnosis not present

## 2019-11-25 DIAGNOSIS — D631 Anemia in chronic kidney disease: Secondary | ICD-10-CM | POA: Diagnosis not present

## 2019-11-25 DIAGNOSIS — M48062 Spinal stenosis, lumbar region with neurogenic claudication: Secondary | ICD-10-CM | POA: Diagnosis not present

## 2019-11-25 DIAGNOSIS — E1165 Type 2 diabetes mellitus with hyperglycemia: Secondary | ICD-10-CM

## 2019-11-25 DIAGNOSIS — E1122 Type 2 diabetes mellitus with diabetic chronic kidney disease: Secondary | ICD-10-CM | POA: Diagnosis not present

## 2019-11-25 DIAGNOSIS — E1151 Type 2 diabetes mellitus with diabetic peripheral angiopathy without gangrene: Secondary | ICD-10-CM | POA: Diagnosis not present

## 2019-11-25 DIAGNOSIS — E11319 Type 2 diabetes mellitus with unspecified diabetic retinopathy without macular edema: Secondary | ICD-10-CM | POA: Diagnosis not present

## 2019-11-25 DIAGNOSIS — I130 Hypertensive heart and chronic kidney disease with heart failure and stage 1 through stage 4 chronic kidney disease, or unspecified chronic kidney disease: Secondary | ICD-10-CM | POA: Diagnosis not present

## 2019-11-25 DIAGNOSIS — Z4781 Encounter for orthopedic aftercare following surgical amputation: Secondary | ICD-10-CM | POA: Diagnosis not present

## 2019-11-25 DIAGNOSIS — F32A Depression, unspecified: Secondary | ICD-10-CM

## 2019-11-25 LAB — CBC WITH DIFFERENTIAL/PLATELET
Basophils Absolute: 0.1 10*3/uL (ref 0.0–0.1)
Basophils Relative: 0.8 % (ref 0.0–3.0)
Eosinophils Absolute: 0.2 10*3/uL (ref 0.0–0.7)
Eosinophils Relative: 1.7 % (ref 0.0–5.0)
HCT: 30.8 % — ABNORMAL LOW (ref 36.0–46.0)
Hemoglobin: 10 g/dL — ABNORMAL LOW (ref 12.0–15.0)
Lymphocytes Relative: 15.3 % (ref 12.0–46.0)
Lymphs Abs: 1.6 10*3/uL (ref 0.7–4.0)
MCHC: 32.5 g/dL (ref 30.0–36.0)
MCV: 84.1 fl (ref 78.0–100.0)
Monocytes Absolute: 0.7 10*3/uL (ref 0.1–1.0)
Monocytes Relative: 7.3 % (ref 3.0–12.0)
Neutro Abs: 7.7 10*3/uL (ref 1.4–7.7)
Neutrophils Relative %: 74.9 % (ref 43.0–77.0)
Platelets: 501 10*3/uL — ABNORMAL HIGH (ref 150.0–400.0)
RBC: 3.66 Mil/uL — ABNORMAL LOW (ref 3.87–5.11)
RDW: 15.3 % (ref 11.5–15.5)
WBC: 10.2 10*3/uL (ref 4.0–10.5)

## 2019-11-25 LAB — COMPREHENSIVE METABOLIC PANEL
ALT: 11 U/L (ref 0–35)
AST: 14 U/L (ref 0–37)
Albumin: 2.8 g/dL — ABNORMAL LOW (ref 3.5–5.2)
Alkaline Phosphatase: 119 U/L — ABNORMAL HIGH (ref 39–117)
BUN: 17 mg/dL (ref 6–23)
CO2: 31 mEq/L (ref 19–32)
Calcium: 8.6 mg/dL (ref 8.4–10.5)
Chloride: 96 mEq/L (ref 96–112)
Creatinine, Ser: 0.89 mg/dL (ref 0.40–1.20)
GFR: 65.62 mL/min (ref >60.00)
Glucose, Bld: 198 mg/dL — ABNORMAL HIGH (ref 70–99)
Potassium: 4.3 mEq/L (ref 3.5–5.1)
Sodium: 134 mEq/L — ABNORMAL LOW (ref 135–145)
Total Bilirubin: 0.3 mg/dL (ref 0.2–1.2)
Total Protein: 6.1 g/dL (ref 6.0–8.3)

## 2019-11-25 LAB — LIPID PANEL
Cholesterol: 237 mg/dL — ABNORMAL HIGH (ref 0–200)
HDL: 47.4 mg/dL (ref >39.00)
LDL Cholesterol: 159 mg/dL — ABNORMAL HIGH (ref 0–99)
NonHDL: 189.36
Total CHOL/HDL Ratio: 5
Triglycerides: 150 mg/dL — ABNORMAL HIGH (ref 0.0–149.0)
VLDL: 30 mg/dL (ref 0.0–40.0)

## 2019-11-25 LAB — HEMOGLOBIN A1C: Hgb A1c MFr Bld: 13 % — ABNORMAL HIGH (ref 4.6–6.5)

## 2019-11-25 MED ORDER — DOXYCYCLINE HYCLATE 100 MG PO TABS: 100.0000 mg | ORAL_TABLET | Freq: Two times a day (BID) | ORAL | 0 refills | Status: DC

## 2019-11-25 MED ORDER — ESCITALOPRAM OXALATE 10 MG PO TABS: 10.0000 mg | ORAL_TABLET | Freq: Every day | ORAL | 1 refills | Status: DC

## 2019-11-25 MED ORDER — METFORMIN HCL 1000 MG PO TABS: 1000.0000 mg | ORAL_TABLET | Freq: Two times a day (BID) | ORAL | 3 refills | Status: AC

## 2019-11-25 MED ORDER — BLOOD GLUCOSE METER KIT: PACK | 11 refills | Status: AC

## 2019-11-25 MED ORDER — INSULIN GLARGINE 100 UNIT/ML SOLOSTAR PEN: 10.0000 [IU] | PEN_INJECTOR | Freq: Every day | SUBCUTANEOUS | 11 refills | Status: DC

## 2019-11-25 NOTE — Patient Instructions (Addendum)
Sliding scale insulin 200-250  2 u 251-300 4 u  301-350-6 u 351-400 8 u >400 10 u and call dr       Wound Infection A wound infection happens when tiny organisms (microorganisms) start to grow in a wound. A wound infection is most often caused by bacteria. Infection can cause the wound to break open or worsen. Wound infection needs treatment. If a wound infection is left untreated, complications can occur. Untreated wound infections may lead to an infection in the bloodstream (septicemia) or a bone infection (osteomyelitis). What are the causes? This condition is most often caused by bacteria growing in a wound. Other microorganisms, like yeast and fungi, can also cause wound infections. What increases the risk? The following factors may make you more likely to develop this condition:  Having a weak body defense system (immune system).  Having diabetes.  Taking steroid medicines for a long time (chronic use).  Smoking.  Being an older person.  Being overweight.  Taking chemotherapy medicines. What are the signs or symptoms? Symptoms of this condition include:  Having more redness, swelling, or pain at the wound site.  Having more blood or fluid at the wound site.  A bad smell coming from a wound or bandage (dressing).  Having a fever.  Feeling tired or fatigued.  Having warmth at or around the wound.  Having pus at the wound site. How is this diagnosed? This condition is diagnosed with a medical history and physical exam. You may also have a wound culture or blood tests or both. How is this treated? This condition is usually treated with an antibiotic medicine.  The infection should improve 24-48 hours after you start antibiotics.  After 24-48 hours, redness around the wound should stop spreading, and the wound should be less painful. Follow these instructions at home: Medicines  Take or apply over-the-counter and prescription medicines only as told by your  health care provider.  If you were prescribed an antibiotic medicine, take or apply it as told by your health care provider. Do not stop using the antibiotic even if you start to feel better. Wound care   Clean the wound each day, or as told by your health care provider. ? Wash the wound with mild soap and water. ? Rinse the wound with water to remove all soap. ? Pat the wound dry with a clean towel. Do not rub it.  Follow instructions from your health care provider about how to take care of your wound. Make sure you: ? Wash your hands with soap and water before and after you change your dressing. If soap and water are not available, use hand sanitizer. ? Change your dressing as told by your health care provider. ? Leave stitches (sutures), skin glue, or adhesive strips in place if your wound has been closed. These skin closures may need to stay in place for 2 weeks or longer. If adhesive strip edges start to loosen and curl up, you may trim the loose edges. Do not remove adhesive strips completely unless your health care provider tells you to do that. Some wounds are left open to heal on their own.  Check your wound every day for signs of infection. Watch for: ? More redness, swelling, or pain. ? More fluid or blood. ? Warmth. ? Pus or a bad smell. General instructions  Keep the dressing dry until your health care provider says it can be removed.  Do not take baths, swim, or use a hot tub  until your health care provider approves. Ask your health care provider if you may take showers. You may only be allowed to take sponge baths.  Raise (elevate) the injured area above the level of your heart while you are sitting or lying down.  Do not scratch or pick at the wound.  Keep all follow-up visits as told by your health care provider. This is important. Contact a health care provider if:  Your pain is not controlled with medicine.  You have more redness, swelling, or pain around your  wound.  You have more fluid or blood coming from your wound.  Your wound feels warm to the touch.  You have pus coming from your wound.  You continue to notice a bad smell coming from your wound or your dressing.  Your wound that was closed breaks open. Get help right away if:  You have a red streak going away from your wound.  You have a fever. Summary  A wound infection happens when tiny organisms (microorganisms) start to grow in a wound.  This condition is usually treated with an antibiotic medicine.  Follow instructions from your health care provider about how to take care of your wound.  Contact a health care provider if your wound infection does not begin to improve in 24-48 hours, or your symptoms worsen.  Keep all follow-up visits as told by your health care provider. This is important. This information is not intended to replace advice given to you by your health care provider. Make sure you discuss any questions you have with your health care provider. Document Revised: 04/06/2018 Document Reviewed: 04/06/2018 Elsevier Patient Education  [place].

## 2019-11-25 NOTE — Assessment & Plan Note (Signed)
Restart lexapro F/u pcp 1 month or sooner prn

## 2019-11-25 NOTE — Progress Notes (Signed)
[name], female    DOB: 1966/04/24  Age: 54 y.o. MRN: [id number]    Subjective:  Subjective  HPI [name] presents for f/u dm,  She recently had amputation and was d/c Friday.   Wound is infected per care taker and bs running high---- 600 last night.  Pt is not eating well.  She stopped taking all her meds but lantus a while back   Review of Systems  Constitutional: Positive for appetite change and fatigue. Negative for diaphoresis and unexpected weight change.  Eyes: Negative for pain, redness and visual disturbance.  Respiratory: Negative for cough, chest tightness, shortness of breath and wheezing.   Cardiovascular: Negative for chest pain, palpitations and leg swelling.  Endocrine: Negative for cold intolerance, heat intolerance, polydipsia, polyphagia and polyuria.  Genitourinary: Negative for difficulty urinating, dysuria and frequency.  Neurological: Negative for dizziness, light-headedness, numbness and headaches.    History Past Medical History:  Diagnosis Date  . Abscess of great toe, right   . Acute on chronic diastolic CHF (congestive heart failure) ([place]) 06/24/2018  . Acute osteomyelitis of toe, left ([place]) 09/05/2016  . Amputation of right great toe ([place]) 12/22/2018  . Anxiety   . Cataract    left - surgery to remove  . Cellulitis of foot, right 12/11/2018  . COMMON MIGRAINE 10/07/2010  . Coronary artery disease   . Decreased visual acuity 11/10/2016  . Depression 12/18/2012  . Diabetes mellitus type II, uncontrolled ([place]) 10/07/2010   Qualifier: Diagnosis of  By: Charlett Blake MD, [name]    . Diabetic foot infection ([place]) 08/26/2016  . Diabetic infection of right foot ([place])   . Disturbance of skin sensation 10/07/2010  . Gangrene of right foot ([place])   . GERD (gastroesophageal reflux disease)   . Heart murmur   . History of kidney stones    "years ago" - passed stones  . Hyperlipidemia 12/06/2010  . Hypertension   . Hypothyroidism   . Lipoma of  abdominal wall 10/05/2016  . Overweight(278.02) 12/06/2010  . PERIPHERAL NEUROPATHY, FEET 10/07/2010   11/17/2019  . PVD (peripheral vascular disease) ([place]) 01/21/2012  . RESTLESS LEG SYNDROME 10/25/2010  . Stroke [facility]) 2014, 2017   most recently in 2/17 - intracerebral hemorrhage    She has a past surgical history that includes Wisdom tooth extraction; Amputation toe (Left, 09/09/2016); Coronary artery bypass graft (N/A, 06/28/2018); TEE without cardioversion (N/A, 06/28/2018); Radial artery harvest (Left, 06/28/2018); Endoharvest vein of greater saphenous vein (Right, 06/28/2018); LEFT HEART CATH AND CORONARY ANGIOGRAPHY (N/A, 06/24/2018); Amputation (Right, 12/13/2018); Cardiac catheterization (06/24/2018); Amputation (Right, 12/31/2018); Eye surgery (Left, 06/2017); Below knee leg amputation (Left, 09/21/2019); Amputation (Left, 09/21/2019); and Stump revision (Left, 11/18/2019).   Her family history includes Alcohol abuse in her brother; Arthritis in her mother; Diabetes in her paternal grandmother; Healthy in her son; Leukemia in her brother; Stroke in her brother.She reports that she quit smoking about 14 years ago. She smoked 0.50 packs per day. She has never used smokeless tobacco. She reports previous drug use. She reports that she does not drink alcohol.  Current Outpatient Medications on File Prior to Visit  Medication Sig Dispense Refill  . acetaminophen (TYLENOL) 325 MG tablet Take 2 tablets (650 mg total) by mouth every 6 (six) hours as needed for mild pain or headache.    Marland Kitchen amLODipine (NORVASC) 10 MG tablet Take 1 tablet (10 mg total) by mouth daily. 30 tablet 1  . aspirin EC 81 MG tablet Take 81  mg by mouth daily.    Marland Kitchen atorvastatin (LIPITOR) 80 MG tablet Take 1 tablet (80 mg total) by mouth daily at 6 PM. 30 tablet 1  . azelastine (ASTELIN) 0.1 % nasal spray Place 2 sprays into both nostrils 2 (two) times daily. Use in each nostril as directed 30 mL 3  . docusate sodium (COLACE) 100 MG  capsule Take 1 capsule (100 mg total) by mouth 2 (two) times daily. 10 capsule 0  . hydrALAZINE (APRESOLINE) 10 MG tablet Take 1 tablet (10 mg total) by mouth 2 (two) times daily. 60 tablet 1  . levothyroxine (SYNTHROID) 25 MCG tablet TAKE 1 TABLET (25 MCG TOTAL) BY MOUTH DAILY BEFORE BREAKFAST. (Patient taking differently: Take 25 mcg by mouth daily before breakfast. ) 90 tablet 1  . methocarbamol (ROBAXIN) 500 MG tablet Take 1 tablet (500 mg total) by mouth every 6 (six) hours as needed for muscle spasms. 60 tablet 0  . metoCLOPramide (REGLAN) 5 MG tablet Take 1 tablet (5 mg total) by mouth 4 (four) times daily -  before meals and at bedtime. 90 tablet 0  . Multiple Vitamin (MULTIVITAMIN WITH MINERALS) TABS tablet Take 1 tablet by mouth daily.    Marland Kitchen oxyCODONE (OXY IR/ROXICODONE) 5 MG immediate release tablet Take 1 tablet (5 mg total) by mouth every 4 (four) hours as needed for moderate pain (pain score 4-6). 30 tablet 0  . oxyCODONE-acetaminophen (PERCOCET) 5-325 MG tablet Take 1 tablet by mouth every 4 (four) hours as needed for severe pain. 20 tablet 0  . pantoprazole (PROTONIX) 40 MG tablet Take 1 tablet (40 mg total) by mouth daily. 30 tablet 0  . polyethylene glycol (MIRALAX / GLYCOLAX) 17 g packet Take 17 g by mouth daily. 14 each 0   No current facility-administered medications on file prior to visit.     Objective:  Objective  Physical Exam Vitals and nursing note reviewed.  Constitutional:      Appearance: She is well-developed.  HENT:     Head: Normocephalic and atraumatic.  Eyes:     Conjunctiva/sclera: Conjunctivae normal.  Neck:     Thyroid: No thyromegaly.     Vascular: No carotid bruit or JVD.  Cardiovascular:     Rate and Rhythm: Normal rate and regular rhythm.     Heart sounds: Normal heart sounds. No murmur.  Pulmonary:     Effort: Pulmonary effort is normal. No respiratory distress.     Breath sounds: Normal breath sounds. No wheezing or rales.  Chest:     Chest  wall: No tenderness.  Musculoskeletal:        General: Deformity present.     Cervical back: Normal range of motion and neck supple.     Comments: L BKA ----- wound red inflamed, draining   Neurological:     Mental Status: She is alert and oriented to person, place, and time.    BP 129/90 (BP Location: Right Arm, Patient Position: Sitting, Cuff Size: Normal)   Pulse 85   Temp 97.9 F (36.6 C) (Temporal)   LMP 02/12/2011 (Exact Date)   SpO2 98%  Wt Readings from Last 3 Encounters:  11/23/19 132 lb (59.9 kg)  11/18/19 132 lb 0.9 oz (59.9 kg)  11/14/19 132 lb (59.9 kg)     Lab Results  Component Value Date   WBC 8.3 10/11/2019   HGB 9.5 (L) 10/11/2019   HCT 29.8 (L) 10/11/2019   PLT 654 (H) 10/11/2019   GLUCOSE 86 10/11/2019  CHOL 200 06/24/2018   TRIG 127 06/24/2018   HDL 36 (L) 06/24/2018   LDLDIRECT 133.0 03/02/2017   LDLCALC 139 (H) 06/24/2018   ALT 17 10/11/2019   AST 14 (L) 10/11/2019   NA 139 10/11/2019   K 4.2 10/11/2019   CL 102 10/11/2019   CREATININE 1.66 (H) 10/11/2019   BUN 20 10/11/2019   CO2 25 10/11/2019   TSH 3.396 07/02/2018   INR 1.30 06/28/2018   HGBA1C 14.1 (H) 09/21/2019   MICROALBUR 174.3 (H) 08/14/2017    No results found.   Assessment & Plan:  Plan  I have changed Tyaira Cafaro. Keith[place]'s insulin glargine. I am also having her start on metFORMIN and doxycycline. Additionally, I am having her maintain her acetaminophen, azelastine, aspirin EC, docusate sodium, levothyroxine, metoCLOPramide, multivitamin with minerals, amLODipine, atorvastatin, hydrALAZINE, methocarbamol, polyethylene glycol, pantoprazole, oxyCODONE, oxyCODONE-acetaminophen, and escitalopram.  Meds ordered this encounter  Medications  . escitalopram (LEXAPRO) 10 MG tablet    Sig: Take 1 tablet (10 mg total) by mouth daily.    Dispense:  90 tablet    Refill:  1  . insulin glargine (LANTUS) 100 UNIT/ML Solostar Pen    Sig: Inject 10 Units into the skin at bedtime.     Dispense:  15 mL    Refill:  11  . metFORMIN (GLUCOPHAGE) 1000 MG tablet    Sig: Take 1 tablet (1,000 mg total) by mouth 2 (two) times daily with a meal.    Dispense:  180 tablet    Refill:  3  . doxycycline (VIBRA-TABS) 100 MG tablet    Sig: Take 1 tablet (100 mg total) by mouth 2 (two) times daily.    Dispense:  20 tablet    Refill:  0    Problem List Items Addressed This Visit      Unprioritized   Depression    Restart lexapro F/u pcp 1 month or sooner prn       Relevant Medications   escitalopram (LEXAPRO) 10 MG tablet   Uncontrolled type 2 diabetes mellitus with hyperglycemia (HCC) - Primary   Relevant Medications   insulin glargine (LANTUS) 100 UNIT/ML Solostar Pen   metFORMIN (GLUCOPHAGE) 1000 MG tablet   Other Relevant Orders   Ambulatory referral to Endocrinology   CBC with Differential/Platelet   Lipid panel   Hemoglobin A1c   Comprehensive metabolic panel   Wound infection after surgery    abx per orders  Pt has f/u with ortho Monday      Relevant Medications   doxycycline (VIBRA-TABS) 100 MG tablet   Other Relevant Orders   CBC with Differential/Platelet      Follow-up: Return in about 4 weeks (around 12/23/2019) for diabetes II.  Ann Held, DO

## 2019-11-25 NOTE — Telephone Encounter (Signed)
Home Health called stated patient missed session of home health today and they will resume next week. No phone call back required

## 2019-11-25 NOTE — Telephone Encounter (Signed)
Pt is s/p a left revision BKA this month and was in the office 11/23/19 the pt saw her pcp today and was advised that the amputation site is infected and started her on Doxy BID. The pt's son is upset and wants to know why she was not treated for this infection when she came in our office this week. Please read the message below. I tried to call but was unsuccessful and thought that you may want to call and discuss with the family.

## 2019-11-25 NOTE — Telephone Encounter (Signed)
Patient's son called.   The patient was seen here yesterday for her open and bleeding wound. She was taken care of and sent home but her children felt she still needed a second opinion.   After receiving the second opinion, they were informed that she had an infection in the area. The son is now upset and requesting an explanation for not being informed of it here.   Call back: [phone]

## 2019-11-25 NOTE — Telephone Encounter (Signed)
noted 

## 2019-11-25 NOTE — Telephone Encounter (Signed)
Pt was called and made aware RX would be sent.  RX was called into pharmacy since I am not located in [place] to get MD signature.

## 2019-11-25 NOTE — Telephone Encounter (Signed)
I tried to call the pt's son but there was no answer and voicemail has not been set up so unable to leave a message. Will hold and try again later.

## 2019-11-25 NOTE — Assessment & Plan Note (Signed)
abx per orders  Pt has f/u with ortho Monday

## 2019-11-28 ENCOUNTER — Telehealth: Payer: Self-pay

## 2019-11-28 ENCOUNTER — Other Ambulatory Visit: Payer: Self-pay

## 2019-11-28 ENCOUNTER — Ambulatory Visit (INDEPENDENT_AMBULATORY_CARE_PROVIDER_SITE_OTHER): Payer: BC Managed Care – PPO | Admitting: Orthopedic Surgery

## 2019-11-28 ENCOUNTER — Encounter: Payer: Self-pay | Admitting: Orthopedic Surgery

## 2019-11-28 VITALS — Ht 62.0 in | Wt 132.0 lb

## 2019-11-28 DIAGNOSIS — Z4781 Encounter for orthopedic aftercare following surgical amputation: Secondary | ICD-10-CM | POA: Diagnosis not present

## 2019-11-28 DIAGNOSIS — E1122 Type 2 diabetes mellitus with diabetic chronic kidney disease: Secondary | ICD-10-CM | POA: Diagnosis not present

## 2019-11-28 DIAGNOSIS — F329 Major depressive disorder, single episode, unspecified: Secondary | ICD-10-CM | POA: Diagnosis not present

## 2019-11-28 DIAGNOSIS — E11319 Type 2 diabetes mellitus with unspecified diabetic retinopathy without macular edema: Secondary | ICD-10-CM | POA: Diagnosis not present

## 2019-11-28 DIAGNOSIS — E1142 Type 2 diabetes mellitus with diabetic polyneuropathy: Secondary | ICD-10-CM | POA: Diagnosis not present

## 2019-11-28 DIAGNOSIS — M4726 Other spondylosis with radiculopathy, lumbar region: Secondary | ICD-10-CM | POA: Diagnosis not present

## 2019-11-28 DIAGNOSIS — E785 Hyperlipidemia, unspecified: Secondary | ICD-10-CM | POA: Diagnosis not present

## 2019-11-28 DIAGNOSIS — I2510 Atherosclerotic heart disease of native coronary artery without angina pectoris: Secondary | ICD-10-CM | POA: Diagnosis not present

## 2019-11-28 DIAGNOSIS — G546 Phantom limb syndrome with pain: Secondary | ICD-10-CM | POA: Diagnosis not present

## 2019-11-28 DIAGNOSIS — E1151 Type 2 diabetes mellitus with diabetic peripheral angiopathy without gangrene: Secondary | ICD-10-CM | POA: Diagnosis not present

## 2019-11-28 DIAGNOSIS — I5032 Chronic diastolic (congestive) heart failure: Secondary | ICD-10-CM | POA: Diagnosis not present

## 2019-11-28 DIAGNOSIS — N183 Chronic kidney disease, stage 3 unspecified: Secondary | ICD-10-CM | POA: Diagnosis not present

## 2019-11-28 DIAGNOSIS — D631 Anemia in chronic kidney disease: Secondary | ICD-10-CM | POA: Diagnosis not present

## 2019-11-28 DIAGNOSIS — I130 Hypertensive heart and chronic kidney disease with heart failure and stage 1 through stage 4 chronic kidney disease, or unspecified chronic kidney disease: Secondary | ICD-10-CM | POA: Diagnosis not present

## 2019-11-28 DIAGNOSIS — Z89512 Acquired absence of left leg below knee: Secondary | ICD-10-CM

## 2019-11-28 DIAGNOSIS — E039 Hypothyroidism, unspecified: Secondary | ICD-10-CM | POA: Diagnosis not present

## 2019-11-28 DIAGNOSIS — M48062 Spinal stenosis, lumbar region with neurogenic claudication: Secondary | ICD-10-CM | POA: Diagnosis not present

## 2019-11-28 NOTE — Telephone Encounter (Signed)
Discussed with pt care giver this morning at appt. States that she will relay information to the pt's son as he is at work this morning and they were unable to reach him.

## 2019-11-28 NOTE — Telephone Encounter (Signed)
I called the son's phone number on Friday and there was no answer as well.  Patient is scheduled for an appointment today and we will address concerns today.

## 2019-11-28 NOTE — Progress Notes (Signed)
Office Visit Note   Patient: [name]           Date of Birth: November 04, 1965           MRN: [id number] Visit Date: 11/28/2019              Requested by: Mosie Lukes, MD [address] [place],   [id number] PCP: Mosie Lukes, MD  Chief Complaint  Patient presents with  . Left Leg - Follow-up    11/18/2019 Revision Left BKA      HPI: Patient is a 54 year old woman who presents in follow-up status post revision left transtibial amputation.  She has an eschar laterally and some redness around the sutures laterally.  Patient states that she was started back on Metformin she states she has had diarrhea from this in the past and she states that she now has vomiting and diarrhea and her blood sugars are running from 500-600.  Patient denies any fever or chills.  Patient states she was given a prescription by her primary care physician for doxycycline but she states she is only taken this twice.  Patient states that her Lantus insulin was increased to 10 units a day.  Assessment & Plan: Visit Diagnoses:  1. Acquired absence of left leg below knee (HCC)     Plan: Plan: Discussed the importance of protein supplement 3 times a day she will either use the prepackaged or powdered protein.  Discussed the importance of proper glucose management in order for her to proceed with wound healing.  She will call her primary care physician to relay the elevated blood sugars and adjust her insulin dosage.  Anticipate patient will need to discontinue the Metformin due to the nausea vomiting and diarrhea.  Patient and family will call if they have any questions and follow-up on Monday.  Patient was given a prescription for [employer] for prosthesis for both lower extremities a stump shrinker on the left side K2 level ambulator.  Follow-Up Instructions: Return in about 1 week (around 12/05/2019).   Ortho Exam  Patient is alert, oriented, no adenopathy, well-dressed, normal affect,  normal respiratory effort. Examination the medial aspect of the left transtibial amputation is healing nicely with wound edges well approximated no drainage no cellulitis no signs of infection.  Laterally patient has a superficial eschar that is 10 mm in diameter and 5 cm in length this appears superficial there is redness around the sutures on the lateral aspect of the incision there is no ascending cellulitis no purulent drainage.  Patient has worked with Museum/gallery curator in the past for her right leg but has not followed up yet for her stump shrinker on the left.  Imaging: No results found. No images are attached to the encounter.  Labs: Lab Results  Component Value Date   HGBA1C 13.0 (H) 11/25/2019   HGBA1C 14.1 (H) 09/21/2019   HGBA1C 10.5 (H) 12/12/2018   ESRSEDRATE 130 (H) 12/12/2018   ESRSEDRATE 115 (H) 06/24/2018   ESRSEDRATE 107 (H) 09/04/2016   CRP 28.2 (H) 12/12/2018   CRP 7.2 (H) 09/04/2016   CRP 0.7 03/21/2015   LABURIC 3.2 06/13/2014   LABURIC 3.1 02/09/2014   REPTSTATUS 10/08/2019 FINAL 10/06/2019   GRAMSTAIN  12/13/2018    FEW WBC PRESENT,BOTH PMN AND MONONUCLEAR NO ORGANISMS SEEN    CULT >=100,000 COLONIES/mL CITROBACTER FREUNDII (A) 10/06/2019   LABORGA CITROBACTER FREUNDII (A) 10/06/2019     Lab Results  Component Value Date  ALBUMIN 2.8 (L) 11/25/2019   ALBUMIN 2.2 (L) 10/11/2019   ALBUMIN 2.1 (L) 09/28/2019   PREALBUMIN 12.7 (L) 09/04/2016   LABURIC 3.2 06/13/2014   LABURIC 3.1 02/09/2014    Lab Results  Component Value Date   MG 2.1 12/16/2018   MG 2.8 (H) 06/29/2018   MG 3.0 (H) 06/29/2018   No results found for: VD25OH  Lab Results  Component Value Date   PREALBUMIN 12.7 (L) 09/04/2016   CBC EXTENDED Latest Ref Rng & Units 11/25/2019 10/11/2019 09/28/2019  WBC 4.0 - 10.5 K/uL 10.2 8.3 9.6  RBC 3.87 - 5.11 Mil/uL 3.66(L) 3.52(L) 3.98  HGB 12.0 - 15.0 g/dL 10.0(L) 9.5(L) 10.7(L)  HCT 36.0 - 46.0 % 30.8(L) 29.8(L) 34.4(L)  PLT 150.0 - 400.0 K/uL  501.0(H) 654(H) 640(H)  NEUTROABS 1.4 - 7.7 K/uL 7.7 4.5 7.1  LYMPHSABS 0.7 - 4.0 K/uL 1.6 2.5 1.4     Body mass index is 24.14 kg/m.  Orders:  No orders of the defined types were placed in this encounter.  No orders of the defined types were placed in this encounter.    Procedures: No procedures performed  Clinical Data: No additional findings.  ROS:  All other systems negative, except as noted in the HPI. Review of Systems  Objective: Vital Signs: Ht 5\' 2"  (1.575 m)   Wt 132 lb (59.9 kg)   LMP 02/12/2011 (Exact Date)   BMI 24.14 kg/m   Specialty Comments:  No specialty comments available.  PMFS History: Patient Active Problem List   Diagnosis Date Noted  . Uncontrolled type 2 diabetes mellitus with hyperglycemia ([place]) 11/25/2019  . Wound infection after surgery 11/25/2019  . Dehiscence of amputation stump ([place])   . Adjustment disorder with mixed anxiety and depressed mood   . S/P bilateral BKA (below knee amputation) ([place]) 09/27/2019  . Acute osteomyelitis of left foot ([place]) 09/21/2019  . Subacute osteomyelitis, left ankle and foot ([place])   . Pressure injury of left heel, unstageable ([place]) 02/09/2019  . Hypoglycemia   . Diabetes mellitus type 2 in nonobese (HCC)   . Labile blood pressure   . Labile blood glucose   . Phantom limb pain ([place])   . CKD (chronic kidney disease), stage III   . Hypothyroidism   . Essential hypertension   . Chronic diastolic congestive heart failure ([place])   . Coronary artery disease involving coronary bypass graft of native heart without angina pectoris   . Poorly controlled type 2 diabetes mellitus with peripheral neuropathy ([place])   . Acute on chronic anemia   . History of below knee amputation, right ([place]) 12/31/2018  . S/P CABG x 3 06/28/2018  . Coronary artery disease involving native coronary artery of native heart with angina pectoris ([place]) 06/25/2018  . Type 2 diabetes mellitus with vascular disease ([place]) 06/24/2018  .  Acute on chronic diastolic CHF (congestive heart failure) ([place]) 06/24/2018  . Non-ST elevation (NSTEMI) myocardial infarction ([place]) 06/24/2018  . Chest pain 06/23/2018  . Glaucoma 09/17/2017  . Diabetic retinopathy ([place]) 09/17/2017  . Otitis media 11/10/2016  . Decreased visual acuity 11/10/2016  . Spinal stenosis, lumbar region with neurogenic claudication 10/22/2016  . Other spondylosis with radiculopathy, lumbar region 10/13/2016  . Lipoma of abdominal wall 10/05/2016  . Severe protein-calorie malnutrition ([place]) 09/04/2016  . Anemia of chronic disease 09/04/2016  . Diabetic polyneuropathy associated with type 2 diabetes mellitus ([place]) 05/07/2016  . Orthostatic hypotension 05/07/2016  . Cerebrovascular disease 04/27/2016  . N&V (nausea and vomiting)  04/01/2016  . ICH (intracerebral hemorrhage) ([place]) 03/30/2016  . Visit for preventive health examination 05/21/2015  . Breast cancer screening 05/21/2015  . Arm lesion 07/02/2014  . Pain in limb 01/13/2014  . Neuropathic pain of both legs 01/13/2014  . Hip pain 01/12/2014  . Allergic rhinitis 03/10/2013  . Back pain 03/08/2013  . Depression 12/18/2012  . PVD (peripheral vascular disease) ([place]) 01/21/2012  . Hyperlipidemia 12/06/2010  . Overweight(278.02) 12/06/2010  . RESTLESS LEG SYNDROME 10/25/2010  . Migraine without aura 10/07/2010  . Hereditary and idiopathic peripheral neuropathy 10/07/2010  . Essential hypertension, benign 10/07/2010  . DISTURBANCE OF SKIN SENSATION 10/07/2010  . HEART MURMUR, HX OF 10/07/2010  . NEPHROLITHIASIS, HX OF 10/07/2010   Past Medical History:  Diagnosis Date  . Abscess of great toe, right   . Acute on chronic diastolic CHF (congestive heart failure) ([place]) 06/24/2018  . Acute osteomyelitis of toe, left ([facility]) 09/05/2016  . Amputation of right great toe ([place]) 12/22/2018  . Anxiety   . Cataract    left - surgery to remove  . Cellulitis of foot, right 12/11/2018  . COMMON MIGRAINE 10/07/2010  .  Coronary artery disease   . Decreased visual acuity 11/10/2016  . Depression 12/18/2012  . Diabetes mellitus type II, uncontrolled ([place]) 10/07/2010   Qualifier: Diagnosis of  By: Charlett Blake MD, [name]    . Diabetic foot infection ([place]) 08/26/2016  . Diabetic infection of right foot ([place])   . Disturbance of skin sensation 10/07/2010  . Gangrene of right foot ([place])   . GERD (gastroesophageal reflux disease)   . Heart murmur   . History of kidney stones    "years ago" - passed stones  . Hyperlipidemia 12/06/2010  . Hypertension   . Hypothyroidism   . Lipoma of abdominal wall 10/05/2016  . Overweight(278.02) 12/06/2010  . PERIPHERAL NEUROPATHY, FEET 10/07/2010   11/17/2019  . PVD (peripheral vascular disease) ([place]) 01/21/2012  . RESTLESS LEG SYNDROME 10/25/2010  . Stroke [facility]) 2014, 2017   most recently in 2/17 - intracerebral hemorrhage    Family History  Problem Relation Age of Onset  . Arthritis Mother   . Stroke Brother        previous smoker  . Alcohol abuse Brother        in remission  . Leukemia Brother   . Diabetes Paternal Grandmother   . Healthy Son     Past Surgical History:  Procedure Laterality Date  . AMPUTATION Right 12/13/2018   Procedure: AMPUTATION RIGHT GREAT TOE, LOCAL RELOCATION OF TISSUE FOR WOUND CLOSURE 9cm x 3cm, VAC APPLICATION;  Surgeon: [name], MD;  Location: [facility];  Service: Orthopedics;  Laterality: Right;  . AMPUTATION Right 12/31/2018   Procedure: RIGHT BELOW KNEE AMPUTATION;  Surgeon: [name], MD;  Location: [facility];  Service: Orthopedics;  Laterality: Right;  . AMPUTATION Left 09/21/2019   Procedure: LEFT BELOW KNEE AMPUTATION;  Surgeon: [name], MD;  Location: [facility];  Service: Orthopedics;  Laterality: Left;  . AMPUTATION TOE Left 09/09/2016   Procedure: AMPUTATION OF LEFT GREAT TOE;  Surgeon: Milly Jakob, MD;  Location: [facility] [place];  Service: Orthopedics;  Laterality: Left;  . BELOW KNEE LEG AMPUTATION Left 09/21/2019  .  CARDIAC CATHETERIZATION  06/24/2018  . CORONARY ARTERY BYPASS GRAFT N/A 06/28/2018   Procedure: CORONARY ARTERY BYPASS GRAFTING (CABG) times  four, using left internal mammary artery, endoscopically harvested right saphenous vein, and harvested left radial artery;  Surgeon: Modesto Charon  C, MD;  Location: [facility];  Service: Open Heart Surgery;  Laterality: N/A;  . ENDOVEIN HARVEST OF GREATER SAPHENOUS VEIN Right 06/28/2018   Procedure: ENDOVEIN HARVEST OF GREATER SAPHENOUS VEIN;  Surgeon: [name], MD;  Location: [facility] [place];  Service: Open Heart Surgery;  Laterality: Right;  . EYE SURGERY Left 06/2017   [facility]  . LEFT HEART CATH AND CORONARY ANGIOGRAPHY N/A 06/24/2018   Procedure: LEFT HEART CATH AND CORONARY ANGIOGRAPHY;  Surgeon: Troy Sine, MD;  Location: [facility];  Service: Cardiovascular;  Laterality: N/A;  . RADIAL ARTERY HARVEST Left 06/28/2018   Procedure: RADIAL ARTERY HARVEST;  Surgeon: [name], MD;  Location: [facility];  Service: Open Heart Surgery;  Laterality: Left;  . STUMP REVISION Left 11/18/2019   Procedure: REVISION LEFT BELOW KNEE AMPUTATION;  Surgeon: [name], MD;  Location: [facility];  Service: Orthopedics;  Laterality: Left;  . TEE WITHOUT CARDIOVERSION N/A 06/28/2018   Procedure: TRANSESOPHAGEAL ECHOCARDIOGRAM (TEE);  Surgeon: [name], MD;  Location: [facility];  Service: Open Heart Surgery;  Laterality: N/A;  . WISDOM TOOTH EXTRACTION     Social History   Occupational History  . Occupation: Glass blower/designer  Tobacco Use  . Smoking status: Former Smoker    Packs/day: 0.50    Quit date: 09/08/2005    Years since quitting: 14.2  . Smokeless tobacco: Never Used  . Tobacco comment: " 1 smoke a week in the past"  Substance and Sexual Activity  . Alcohol use: No    Alcohol/week: 0.0 standard drinks  . Drug use: Not Currently  . Sexual activity: Yes    Partners: Male    Birth control/protection: Post-menopausal

## 2019-11-28 NOTE — Telephone Encounter (Signed)
Patient called Triage phone. She wants to know what dressings to put on. Called [facility]- Dr. [name] Given advised for her to wash it with water and soap and apply 4 x 4 guaze & ace wrap. Once she gets shrinker just apply shrinker only. Patient aware.

## 2019-11-29 ENCOUNTER — Telehealth: Payer: Self-pay | Admitting: Orthopedic Surgery

## 2019-11-29 NOTE — Telephone Encounter (Signed)
Will fax order to my attention to sign for services from last month.

## 2019-11-29 NOTE — Telephone Encounter (Signed)
Received call from [employer] Curator) with [facility] needing a call back concerning physician orders that was faxed again on 11/23/2019. The order was for Dry dressing until new orders. The order number is [id number]. The number to contact Winifred is [phone]

## 2019-11-30 ENCOUNTER — Encounter: Payer: Self-pay | Admitting: General Practice

## 2019-12-01 ENCOUNTER — Other Ambulatory Visit: Payer: Self-pay

## 2019-12-01 ENCOUNTER — Other Ambulatory Visit: Payer: Self-pay | Admitting: Family Medicine

## 2019-12-01 NOTE — Telephone Encounter (Signed)
Last fill for this was 2017.  Her A1C is elevated when she saw Dr. Etter Sjogren.  She has a follow up with Dr. Etter Sjogren, who restarted her on Lantus.

## 2019-12-01 NOTE — Telephone Encounter (Signed)
Patient called in to see if Dr. Charlett Blake can send in a prescription  For Humolog Pen [facility] send [facility] to [id number] [place] - [name]  [name] [place], [place] [address]  Phone:  [phone] Fax:  [phone]  DEA #:  [id number]

## 2019-12-01 NOTE — Telephone Encounter (Signed)
I do not see Humalog in her list does she still need it since her lantus was restarted. If so how many units has she been taking and  how many times a day? Then we can refill

## 2019-12-02 ENCOUNTER — Other Ambulatory Visit: Payer: Self-pay | Admitting: Family Medicine

## 2019-12-02 DIAGNOSIS — E11319 Type 2 diabetes mellitus with unspecified diabetic retinopathy without macular edema: Secondary | ICD-10-CM | POA: Diagnosis not present

## 2019-12-02 DIAGNOSIS — E1151 Type 2 diabetes mellitus with diabetic peripheral angiopathy without gangrene: Secondary | ICD-10-CM | POA: Diagnosis not present

## 2019-12-02 DIAGNOSIS — M48062 Spinal stenosis, lumbar region with neurogenic claudication: Secondary | ICD-10-CM | POA: Diagnosis not present

## 2019-12-02 DIAGNOSIS — F329 Major depressive disorder, single episode, unspecified: Secondary | ICD-10-CM | POA: Diagnosis not present

## 2019-12-02 DIAGNOSIS — G546 Phantom limb syndrome with pain: Secondary | ICD-10-CM | POA: Diagnosis not present

## 2019-12-02 DIAGNOSIS — E1122 Type 2 diabetes mellitus with diabetic chronic kidney disease: Secondary | ICD-10-CM | POA: Diagnosis not present

## 2019-12-02 DIAGNOSIS — I130 Hypertensive heart and chronic kidney disease with heart failure and stage 1 through stage 4 chronic kidney disease, or unspecified chronic kidney disease: Secondary | ICD-10-CM | POA: Diagnosis not present

## 2019-12-02 DIAGNOSIS — E1142 Type 2 diabetes mellitus with diabetic polyneuropathy: Secondary | ICD-10-CM | POA: Diagnosis not present

## 2019-12-02 DIAGNOSIS — D631 Anemia in chronic kidney disease: Secondary | ICD-10-CM | POA: Diagnosis not present

## 2019-12-02 DIAGNOSIS — I2510 Atherosclerotic heart disease of native coronary artery without angina pectoris: Secondary | ICD-10-CM | POA: Diagnosis not present

## 2019-12-02 DIAGNOSIS — Z4781 Encounter for orthopedic aftercare following surgical amputation: Secondary | ICD-10-CM | POA: Diagnosis not present

## 2019-12-02 DIAGNOSIS — M4726 Other spondylosis with radiculopathy, lumbar region: Secondary | ICD-10-CM | POA: Diagnosis not present

## 2019-12-02 DIAGNOSIS — E785 Hyperlipidemia, unspecified: Secondary | ICD-10-CM | POA: Diagnosis not present

## 2019-12-02 DIAGNOSIS — E039 Hypothyroidism, unspecified: Secondary | ICD-10-CM | POA: Diagnosis not present

## 2019-12-02 DIAGNOSIS — I5032 Chronic diastolic (congestive) heart failure: Secondary | ICD-10-CM | POA: Diagnosis not present

## 2019-12-02 DIAGNOSIS — N183 Chronic kidney disease, stage 3 unspecified: Secondary | ICD-10-CM | POA: Diagnosis not present

## 2019-12-02 MED ORDER — NOVOLOG FLEXPEN 100 UNIT/ML ~~LOC~~ SOPN: PEN_INJECTOR | SUBCUTANEOUS | 5 refills | Status: DC

## 2019-12-02 MED ORDER — INSULIN LISPRO 100 UNIT/ML ~~LOC~~ SOLN: 2.0000 [IU] | Freq: Three times a day (TID) | SUBCUTANEOUS | 11 refills | Status: DC

## 2019-12-02 MED ORDER — "PEN NEEDLES 5/16"" 31G X 8 MM MISC": 5 refills | Status: DC

## 2019-12-02 NOTE — Telephone Encounter (Signed)
Sliding scale was put on top of her AVS I forgot to put it in the note--- put its in the avs

## 2019-12-02 NOTE — Telephone Encounter (Signed)
rx sent in to pharmacy along with pen needles.  Advised patient about her sliding scale again because she did not have her AVS and how to use it.    Sliding scale insulin 200-250  2 u 251-300 4 u  301-350-6 u 351-400 8 u >400 10 u and call dr   [facility] may not cover the Humalog, so per Dr. Izora Ribas pen sent in.

## 2019-12-02 NOTE — Telephone Encounter (Signed)
Yes. If you could switch to that it would be great.

## 2019-12-02 NOTE — Telephone Encounter (Signed)
Patient is suppose to be seen 4/16 (Friday).  This appointment was made with Dr.Lowne.  Dr. Etter Sjogren wants her to see you.  Are you ok with me using a reschedule slot for her to get her in?  I will be happy to call and change appointment.

## 2019-12-02 NOTE — Telephone Encounter (Signed)
We gave her one out of the sample fridge

## 2019-12-02 NOTE — Telephone Encounter (Signed)
The order you placed, HUMALOG KWIKPEN 100 UNIT/ML [place] SOPN, might be dispensed as insulin lispro (HUMALOG KWIKPEN) 100 UNIT/ML KwikPen, which is not on the preferred formulary for the patient's insurance plan. Below are alternatives which are likely to be more affordable. Do not assume that every medication presented is a clinically appropriate alternative.    Novolog Flexpen was an alternative are you ok with that?

## 2019-12-02 NOTE — Telephone Encounter (Signed)
Also app in April needs to be with blyth

## 2019-12-02 NOTE — Telephone Encounter (Signed)
Spoke with patient and she stated that you gave her some samples of Humalog.  She was unsure of how is suppose to be taking, she said about 3 units.  Can you please help me get her straighten out?  She is requesting a new prescription for Humalog.  It seems that she is unsure of what she is doing.  She states that her sugars are better, no numbers given.

## 2019-12-04 NOTE — Telephone Encounter (Signed)
Of course. Just remember I am not in the clinic on Fridays. Otherwise we can find space. Late on a Tuesday or Thursday usually works

## 2019-12-05 NOTE — Telephone Encounter (Signed)
No answer/ vm full to reschedule. I have something for 4/20 at 11am

## 2019-12-06 ENCOUNTER — Encounter: Payer: Self-pay | Admitting: Family

## 2019-12-06 ENCOUNTER — Ambulatory Visit (INDEPENDENT_AMBULATORY_CARE_PROVIDER_SITE_OTHER): Payer: BC Managed Care – PPO | Admitting: Family

## 2019-12-06 ENCOUNTER — Other Ambulatory Visit: Payer: Self-pay

## 2019-12-06 VITALS — Ht 62.0 in | Wt 132.0 lb

## 2019-12-06 DIAGNOSIS — I5032 Chronic diastolic (congestive) heart failure: Secondary | ICD-10-CM | POA: Diagnosis not present

## 2019-12-06 DIAGNOSIS — Z4781 Encounter for orthopedic aftercare following surgical amputation: Secondary | ICD-10-CM | POA: Diagnosis not present

## 2019-12-06 DIAGNOSIS — E11319 Type 2 diabetes mellitus with unspecified diabetic retinopathy without macular edema: Secondary | ICD-10-CM | POA: Diagnosis not present

## 2019-12-06 DIAGNOSIS — E1122 Type 2 diabetes mellitus with diabetic chronic kidney disease: Secondary | ICD-10-CM | POA: Diagnosis not present

## 2019-12-06 DIAGNOSIS — N183 Chronic kidney disease, stage 3 unspecified: Secondary | ICD-10-CM | POA: Diagnosis not present

## 2019-12-06 DIAGNOSIS — E1142 Type 2 diabetes mellitus with diabetic polyneuropathy: Secondary | ICD-10-CM | POA: Diagnosis not present

## 2019-12-06 DIAGNOSIS — I130 Hypertensive heart and chronic kidney disease with heart failure and stage 1 through stage 4 chronic kidney disease, or unspecified chronic kidney disease: Secondary | ICD-10-CM | POA: Diagnosis not present

## 2019-12-06 DIAGNOSIS — E785 Hyperlipidemia, unspecified: Secondary | ICD-10-CM | POA: Diagnosis not present

## 2019-12-06 DIAGNOSIS — Z89512 Acquired absence of left leg below knee: Secondary | ICD-10-CM

## 2019-12-06 DIAGNOSIS — F329 Major depressive disorder, single episode, unspecified: Secondary | ICD-10-CM | POA: Diagnosis not present

## 2019-12-06 DIAGNOSIS — I2510 Atherosclerotic heart disease of native coronary artery without angina pectoris: Secondary | ICD-10-CM | POA: Diagnosis not present

## 2019-12-06 DIAGNOSIS — G546 Phantom limb syndrome with pain: Secondary | ICD-10-CM | POA: Diagnosis not present

## 2019-12-06 DIAGNOSIS — E039 Hypothyroidism, unspecified: Secondary | ICD-10-CM | POA: Diagnosis not present

## 2019-12-06 DIAGNOSIS — D631 Anemia in chronic kidney disease: Secondary | ICD-10-CM | POA: Diagnosis not present

## 2019-12-06 DIAGNOSIS — M48062 Spinal stenosis, lumbar region with neurogenic claudication: Secondary | ICD-10-CM | POA: Diagnosis not present

## 2019-12-06 DIAGNOSIS — M4726 Other spondylosis with radiculopathy, lumbar region: Secondary | ICD-10-CM | POA: Diagnosis not present

## 2019-12-06 DIAGNOSIS — E1151 Type 2 diabetes mellitus with diabetic peripheral angiopathy without gangrene: Secondary | ICD-10-CM | POA: Diagnosis not present

## 2019-12-06 NOTE — Telephone Encounter (Signed)
Spoke with patient and appointment made

## 2019-12-06 NOTE — Progress Notes (Signed)
Post-Op Visit Note   Patient: [name]           Date of Birth: 1965-12-30           MRN: [id number] Visit Date: 12/06/2019 PCP: Mosie Lukes, MD  Chief Complaint:  Chief Complaint  Patient presents with  . Left Leg - Routine Post Op    11/18/19 left revision BKA    HPI:  HPI The patient is a 54 year old woman seen today status post left below knee amputation. Been doing daily dry dressing changes.  Ortho Exam On exam incision is well healed medially. Has some ulceration along incision laterally. There is eschar a length of 4 cm with some surrounding fibrinous tissue. No erythema, no odor. No sign of infection.   Visit Diagnoses:  1. Acquired absence of left leg below knee (HCC)     Plan: will have her begin silvadene dressing changes laterally. Sutures harvested medially. Continue with ace and shrinker. Follow up in 2 weeks.   Follow-Up Instructions: Return in about 2 weeks (around 12/20/2019).   Imaging: No results found.  Orders:  No orders of the defined types were placed in this encounter.  No orders of the defined types were placed in this encounter.    PMFS History: Patient Active Problem List   Diagnosis Date Noted  . Uncontrolled type 2 diabetes mellitus with hyperglycemia ([place]) 11/25/2019  . Wound infection after surgery 11/25/2019  . Dehiscence of amputation stump ([place])   . Adjustment disorder with mixed anxiety and depressed mood   . S/P bilateral BKA (below knee amputation) ([place]) 09/27/2019  . Acute osteomyelitis of left foot ([place]) 09/21/2019  . Subacute osteomyelitis, left ankle and foot ([place])   . Pressure injury of left heel, unstageable ([place]) 02/09/2019  . Hypoglycemia   . Diabetes mellitus type 2 in nonobese (HCC)   . Labile blood pressure   . Labile blood glucose   . Phantom limb pain ([place])   . CKD (chronic kidney disease), stage III   . Hypothyroidism   . Essential hypertension   . Chronic diastolic congestive heart failure  ([place])   . Coronary artery disease involving coronary bypass graft of native heart without angina pectoris   . Poorly controlled type 2 diabetes mellitus with peripheral neuropathy ([place])   . Acute on chronic anemia   . History of below knee amputation, right ([place]) 12/31/2018  . S/P CABG x 3 06/28/2018  . Coronary artery disease involving native coronary artery of native heart with angina pectoris ([place]) 06/25/2018  . Type 2 diabetes mellitus with vascular disease ([place]) 06/24/2018  . Acute on chronic diastolic CHF (congestive heart failure) ([place]) 06/24/2018  . Non-ST elevation (NSTEMI) myocardial infarction ([place]) 06/24/2018  . Chest pain 06/23/2018  . Glaucoma 09/17/2017  . Diabetic retinopathy ([place]) 09/17/2017  . Otitis media 11/10/2016  . Decreased visual acuity 11/10/2016  . Spinal stenosis, lumbar region with neurogenic claudication 10/22/2016  . Other spondylosis with radiculopathy, lumbar region 10/13/2016  . Lipoma of abdominal wall 10/05/2016  . Severe protein-calorie malnutrition ([place]) 09/04/2016  . Anemia of chronic disease 09/04/2016  . Diabetic polyneuropathy associated with type 2 diabetes mellitus ([place]) 05/07/2016  . Orthostatic hypotension 05/07/2016  . Cerebrovascular disease 04/27/2016  . N&V (nausea and vomiting) 04/01/2016  . ICH (intracerebral hemorrhage) ([place]) 03/30/2016  . Visit for preventive health examination 05/21/2015  . Breast cancer screening 05/21/2015  . Arm lesion 07/02/2014  . Pain in limb 01/13/2014  . Neuropathic  pain of both legs 01/13/2014  . Hip pain 01/12/2014  . Allergic rhinitis 03/10/2013  . Back pain 03/08/2013  . Depression 12/18/2012  . PVD (peripheral vascular disease) ([place]) 01/21/2012  . Hyperlipidemia 12/06/2010  . Overweight(278.02) 12/06/2010  . RESTLESS LEG SYNDROME 10/25/2010  . Migraine without aura 10/07/2010  . Hereditary and idiopathic peripheral neuropathy 10/07/2010  . Essential hypertension, benign 10/07/2010  .  DISTURBANCE OF SKIN SENSATION 10/07/2010  . HEART MURMUR, HX OF 10/07/2010  . NEPHROLITHIASIS, HX OF 10/07/2010   Past Medical History:  Diagnosis Date  . Abscess of great toe, right   . Acute on chronic diastolic CHF (congestive heart failure) ([place]) 06/24/2018  . Acute osteomyelitis of toe, left ([place]) 09/05/2016  . Amputation of right great toe ([place]) 12/22/2018  . Anxiety   . Cataract    left - surgery to remove  . Cellulitis of foot, right 12/11/2018  . COMMON MIGRAINE 10/07/2010  . Coronary artery disease   . Decreased visual acuity 11/10/2016  . Depression 12/18/2012  . Diabetes mellitus type II, uncontrolled ([place]) 10/07/2010   Qualifier: Diagnosis of  By: Charlett Blake MD, [name]    . Diabetic foot infection ([place]) 08/26/2016  . Diabetic infection of right foot ([place])   . Disturbance of skin sensation 10/07/2010  . Gangrene of right foot ([place])   . GERD (gastroesophageal reflux disease)   . Heart murmur   . History of kidney stones    "years ago" - passed stones  . Hyperlipidemia 12/06/2010  . Hypertension   . Hypothyroidism   . Lipoma of abdominal wall 10/05/2016  . Overweight(278.02) 12/06/2010  . PERIPHERAL NEUROPATHY, FEET 10/07/2010   11/17/2019  . PVD (peripheral vascular disease) ([place]) 01/21/2012  . RESTLESS LEG SYNDROME 10/25/2010  . Stroke [facility]) 2014, 2017   most recently in 2/17 - intracerebral hemorrhage    Family History  Problem Relation Age of Onset  . Arthritis Mother   . Stroke Brother        previous smoker  . Alcohol abuse Brother        in remission  . Leukemia Brother   . Diabetes Paternal Grandmother   . Healthy Son     Past Surgical History:  Procedure Laterality Date  . AMPUTATION Right 12/13/2018   Procedure: AMPUTATION RIGHT GREAT TOE, LOCAL RELOCATION OF TISSUE FOR WOUND CLOSURE 9cm x 3cm, VAC APPLICATION;  Surgeon: [name], MD;  Location: [place];  Service: Orthopedics;  Laterality: Right;  . AMPUTATION Right 12/31/2018   Procedure: RIGHT BELOW KNEE  AMPUTATION;  Surgeon: [name], MD;  Location: [place];  Service: Orthopedics;  Laterality: Right;  . AMPUTATION Left 09/21/2019   Procedure: LEFT BELOW KNEE AMPUTATION;  Surgeon: [name], MD;  Location: [place];  Service: Orthopedics;  Laterality: Left;  . AMPUTATION TOE Left 09/09/2016   Procedure: AMPUTATION OF LEFT GREAT TOE;  Surgeon: Milly Jakob, MD;  Location: [place];  Service: Orthopedics;  Laterality: Left;  . BELOW KNEE LEG AMPUTATION Left 09/21/2019  . CARDIAC CATHETERIZATION  06/24/2018  . CORONARY ARTERY BYPASS GRAFT N/A 06/28/2018   Procedure: CORONARY ARTERY BYPASS GRAFTING (CABG) times  four, using left internal mammary artery, endoscopically harvested right saphenous vein, and harvested left radial artery;  Surgeon: [name], MD;  Location: [place];  Service: Open Heart Surgery;  Laterality: N/A;  . ENDOVEIN HARVEST OF GREATER SAPHENOUS VEIN Right 06/28/2018   Procedure: ENDOVEIN HARVEST OF GREATER SAPHENOUS VEIN;  Surgeon: Roxan Hockey,  Revonda Standard, MD;  Location: [place];  Service: Open Heart Surgery;  Laterality: Right;  . EYE SURGERY Left 06/2017   [facility]  . LEFT HEART CATH AND CORONARY ANGIOGRAPHY N/A 06/24/2018   Procedure: LEFT HEART CATH AND CORONARY ANGIOGRAPHY;  Surgeon: Troy Sine, MD;  Location: [place] [facility];  Service: Cardiovascular;  Laterality: N/A;  . RADIAL ARTERY HARVEST Left 06/28/2018   Procedure: RADIAL ARTERY HARVEST;  Surgeon: [name], MD;  Location: [place];  Service: Open Heart Surgery;  Laterality: Left;  . STUMP REVISION Left 11/18/2019   Procedure: REVISION LEFT BELOW KNEE AMPUTATION;  Surgeon: [name], MD;  Location: [place];  Service: Orthopedics;  Laterality: Left;  . TEE WITHOUT CARDIOVERSION N/A 06/28/2018   Procedure: TRANSESOPHAGEAL ECHOCARDIOGRAM (TEE);  Surgeon: [name], MD;  Location: [place];  Service: Open Heart Surgery;  Laterality: N/A;  . WISDOM TOOTH EXTRACTION      Social History   Occupational History  . Occupation: Glass blower/designer  Tobacco Use  . Smoking status: Former Smoker    Packs/day: 0.50    Quit date: 09/08/2005    Years since quitting: 14.2  . Smokeless tobacco: Never Used  . Tobacco comment: " 1 smoke a week in the past"  Substance and Sexual Activity  . Alcohol use: No    Alcohol/week: 0.0 standard drinks  . Drug use: Not Currently  . Sexual activity: Yes    Partners: Male    Birth control/protection: Post-menopausal

## 2019-12-13 ENCOUNTER — Telehealth: Payer: Self-pay | Admitting: Orthopedic Surgery

## 2019-12-13 DIAGNOSIS — N183 Chronic kidney disease, stage 3 unspecified: Secondary | ICD-10-CM | POA: Diagnosis not present

## 2019-12-13 DIAGNOSIS — I5032 Chronic diastolic (congestive) heart failure: Secondary | ICD-10-CM | POA: Diagnosis not present

## 2019-12-13 DIAGNOSIS — M4726 Other spondylosis with radiculopathy, lumbar region: Secondary | ICD-10-CM | POA: Diagnosis not present

## 2019-12-13 DIAGNOSIS — M48062 Spinal stenosis, lumbar region with neurogenic claudication: Secondary | ICD-10-CM | POA: Diagnosis not present

## 2019-12-13 DIAGNOSIS — E785 Hyperlipidemia, unspecified: Secondary | ICD-10-CM | POA: Diagnosis not present

## 2019-12-13 DIAGNOSIS — I130 Hypertensive heart and chronic kidney disease with heart failure and stage 1 through stage 4 chronic kidney disease, or unspecified chronic kidney disease: Secondary | ICD-10-CM | POA: Diagnosis not present

## 2019-12-13 DIAGNOSIS — E1151 Type 2 diabetes mellitus with diabetic peripheral angiopathy without gangrene: Secondary | ICD-10-CM | POA: Diagnosis not present

## 2019-12-13 DIAGNOSIS — F329 Major depressive disorder, single episode, unspecified: Secondary | ICD-10-CM | POA: Diagnosis not present

## 2019-12-13 DIAGNOSIS — E1122 Type 2 diabetes mellitus with diabetic chronic kidney disease: Secondary | ICD-10-CM | POA: Diagnosis not present

## 2019-12-13 DIAGNOSIS — I2510 Atherosclerotic heart disease of native coronary artery without angina pectoris: Secondary | ICD-10-CM | POA: Diagnosis not present

## 2019-12-13 DIAGNOSIS — E1142 Type 2 diabetes mellitus with diabetic polyneuropathy: Secondary | ICD-10-CM | POA: Diagnosis not present

## 2019-12-13 DIAGNOSIS — G546 Phantom limb syndrome with pain: Secondary | ICD-10-CM | POA: Diagnosis not present

## 2019-12-13 DIAGNOSIS — Z4781 Encounter for orthopedic aftercare following surgical amputation: Secondary | ICD-10-CM | POA: Diagnosis not present

## 2019-12-13 DIAGNOSIS — E039 Hypothyroidism, unspecified: Secondary | ICD-10-CM | POA: Diagnosis not present

## 2019-12-13 DIAGNOSIS — D631 Anemia in chronic kidney disease: Secondary | ICD-10-CM | POA: Diagnosis not present

## 2019-12-13 DIAGNOSIS — E11319 Type 2 diabetes mellitus with unspecified diabetic retinopathy without macular edema: Secondary | ICD-10-CM | POA: Diagnosis not present

## 2019-12-13 NOTE — Telephone Encounter (Signed)
FYI:  [facility] called and stated patient missed a skilled nursing visit last week

## 2019-12-14 DIAGNOSIS — E1151 Type 2 diabetes mellitus with diabetic peripheral angiopathy without gangrene: Secondary | ICD-10-CM | POA: Diagnosis not present

## 2019-12-14 DIAGNOSIS — D631 Anemia in chronic kidney disease: Secondary | ICD-10-CM | POA: Diagnosis not present

## 2019-12-14 DIAGNOSIS — E1122 Type 2 diabetes mellitus with diabetic chronic kidney disease: Secondary | ICD-10-CM | POA: Diagnosis not present

## 2019-12-14 DIAGNOSIS — E1142 Type 2 diabetes mellitus with diabetic polyneuropathy: Secondary | ICD-10-CM | POA: Diagnosis not present

## 2019-12-14 DIAGNOSIS — E11319 Type 2 diabetes mellitus with unspecified diabetic retinopathy without macular edema: Secondary | ICD-10-CM | POA: Diagnosis not present

## 2019-12-14 DIAGNOSIS — M4726 Other spondylosis with radiculopathy, lumbar region: Secondary | ICD-10-CM | POA: Diagnosis not present

## 2019-12-14 DIAGNOSIS — I5032 Chronic diastolic (congestive) heart failure: Secondary | ICD-10-CM | POA: Diagnosis not present

## 2019-12-14 DIAGNOSIS — G546 Phantom limb syndrome with pain: Secondary | ICD-10-CM | POA: Diagnosis not present

## 2019-12-14 DIAGNOSIS — Z4781 Encounter for orthopedic aftercare following surgical amputation: Secondary | ICD-10-CM | POA: Diagnosis not present

## 2019-12-14 DIAGNOSIS — N183 Chronic kidney disease, stage 3 unspecified: Secondary | ICD-10-CM | POA: Diagnosis not present

## 2019-12-14 DIAGNOSIS — F329 Major depressive disorder, single episode, unspecified: Secondary | ICD-10-CM | POA: Diagnosis not present

## 2019-12-14 DIAGNOSIS — M48062 Spinal stenosis, lumbar region with neurogenic claudication: Secondary | ICD-10-CM | POA: Diagnosis not present

## 2019-12-14 DIAGNOSIS — I2510 Atherosclerotic heart disease of native coronary artery without angina pectoris: Secondary | ICD-10-CM | POA: Diagnosis not present

## 2019-12-14 DIAGNOSIS — E785 Hyperlipidemia, unspecified: Secondary | ICD-10-CM | POA: Diagnosis not present

## 2019-12-14 DIAGNOSIS — E039 Hypothyroidism, unspecified: Secondary | ICD-10-CM | POA: Diagnosis not present

## 2019-12-14 DIAGNOSIS — I130 Hypertensive heart and chronic kidney disease with heart failure and stage 1 through stage 4 chronic kidney disease, or unspecified chronic kidney disease: Secondary | ICD-10-CM | POA: Diagnosis not present

## 2019-12-14 NOTE — Telephone Encounter (Signed)
Noted  

## 2019-12-14 NOTE — Progress Notes (Signed)
Virtual Visit via Telephone Note   This visit type was conducted due to national recommendations for restrictions regarding the COVID-19 Pandemic (e.g. social distancing) in an effort to limit this patient's exposure and mitigate transmission in our community.  Due to her co-morbid illnesses, this patient is at least at moderate risk for complications without adequate follow up.  This format is felt to be most appropriate for this patient at this time.  The patient did not have access to video technology/had technical difficulties with video requiring transitioning to audio format only (telephone).  All issues noted in this document were discussed and addressed.  No physical exam could be performed with this format.  Please refer to the patient's chart for her  consent to telehealth for [facility].   Date:  12/15/2019   ID:  [name], DOB 06-22-1966, MRN [id number]  Patient Location: Home Provider Location: Home  PCP:  Mosie Lukes, MD  Cardiologist:  Elouise Munroe, MD  Electrophysiologist:  None   Evaluation Performed:  Follow-Up Visit  Chief Complaint:  Follow Up  History of Present Illness:    SIA [name] is a 54 y.o. female we are following for ongoing assessment and management of CAD, with hx of CABG X 4, 20/8022, chronic diastolic CHF, Hypertension, h/o intracerebral hemorrhage 10/2015, PAD. She has other history to include right foot gangrene with subsequent BKA, CKD Stage 3, and Type II diabetes.   She has a HHN nurse that checks her BP and helps her with her medications. Due to see her PCP next week for labs and medication refills.  She offers no complaints of chest pain, DOE, bleeding or weakness. She states that the [facility] nurse checks her blood sugar.  She reports that "everythiing is fine."    The patient does not have symptoms concerning for COVID-19 infection (fever, chills, cough, or new shortness of breath).    Past Medical History:  Diagnosis Date    . Abscess of great toe, right   . Acute on chronic diastolic CHF (congestive heart failure) ([place]) 06/24/2018  . Acute osteomyelitis of toe, left ([place]) 09/05/2016  . Amputation of right great toe ([place]) 12/22/2018  . Anxiety   . Cataract    left - surgery to remove  . Cellulitis of foot, right 12/11/2018  . COMMON MIGRAINE 10/07/2010  . Coronary artery disease   . Decreased visual acuity 11/10/2016  . Depression 12/18/2012  . Diabetes mellitus type II, uncontrolled ([place]) 10/07/2010   Qualifier: Diagnosis of  By: Charlett Blake MD, [name]    . Diabetic foot infection ([place]) 08/26/2016  . Diabetic infection of right foot ([place])   . Disturbance of skin sensation 10/07/2010  . Gangrene of right foot ([place])   . GERD (gastroesophageal reflux disease)   . Heart murmur   . History of kidney stones    "years ago" - passed stones  . Hyperlipidemia 12/06/2010  . Hypertension   . Hypothyroidism   . Lipoma of abdominal wall 10/05/2016  . Overweight(278.02) 12/06/2010  . PERIPHERAL NEUROPATHY, FEET 10/07/2010   11/17/2019  . PVD (peripheral vascular disease) ([place]) 01/21/2012  . RESTLESS LEG SYNDROME 10/25/2010  . Stroke [facility]) 2014, 2017   most recently in 2/17 - intracerebral hemorrhage   Past Surgical History:  Procedure Laterality Date  . AMPUTATION Right 12/13/2018   Procedure: AMPUTATION RIGHT GREAT TOE, LOCAL RELOCATION OF TISSUE FOR WOUND CLOSURE 9cm x 3cm, VAC APPLICATION;  Surgeon: [name], MD;  [facility];  Service: Orthopedics;  Laterality: Right;  . AMPUTATION Right 12/31/2018   Procedure: RIGHT BELOW KNEE AMPUTATION;  Surgeon: [name], MD;  Location: [place];  Service: Orthopedics;  Laterality: Right;  . AMPUTATION Left 09/21/2019   Procedure: LEFT BELOW KNEE AMPUTATION;  Surgeon: [name], MD;  Location: [place];  Service: Orthopedics;  Laterality: Left;  . AMPUTATION TOE Left 09/09/2016   Procedure: AMPUTATION OF LEFT GREAT TOE;  Surgeon: Milly Jakob, MD;  Location: [place];  Service: Orthopedics;  Laterality: Left;  . BELOW KNEE LEG AMPUTATION Left 09/21/2019  . CARDIAC CATHETERIZATION  06/24/2018  . CORONARY ARTERY BYPASS GRAFT N/A 06/28/2018   Procedure: CORONARY ARTERY BYPASS GRAFTING (CABG) times  four, using left internal mammary artery, endoscopically harvested right saphenous vein, and harvested left radial artery;  Surgeon: [name], MD;  Location: [place];  Service: Open Heart Surgery;  Laterality: N/A;  . ENDOVEIN HARVEST OF GREATER SAPHENOUS VEIN Right 06/28/2018   Procedure: ENDOVEIN HARVEST OF GREATER SAPHENOUS VEIN;  Surgeon: [name], MD;  Location: [place];  Service: Open Heart Surgery;  Laterality: Right;  . EYE SURGERY Left 06/2017   [facility]  . LEFT HEART CATH AND CORONARY ANGIOGRAPHY N/A 06/24/2018   Procedure: LEFT HEART CATH AND CORONARY ANGIOGRAPHY;  Surgeon: Troy Sine, MD;  Location: [place] [facility];  Service: Cardiovascular;  Laterality: N/A;  . RADIAL ARTERY HARVEST Left 06/28/2018   Procedure: RADIAL ARTERY HARVEST;  Surgeon: [name], MD;  Location: [place];  Service: Open Heart Surgery;  Laterality: Left;  . STUMP REVISION Left 11/18/2019   Procedure: REVISION LEFT BELOW KNEE AMPUTATION;  Surgeon: [name], MD;  Location: [place];  Service: Orthopedics;  Laterality: Left;  . TEE WITHOUT CARDIOVERSION N/A 06/28/2018   Procedure: TRANSESOPHAGEAL ECHOCARDIOGRAM (TEE);  Surgeon: [name], MD;  Location: [place];  Service: Open Heart Surgery;  Laterality: N/A;  . WISDOM TOOTH EXTRACTION       Current Meds  Medication Sig  . acetaminophen (TYLENOL) 325 MG tablet Take 2 tablets (650 mg total) by mouth every 6 (six) hours as needed for mild pain or headache.  Marland Kitchen amLODipine (NORVASC) 10 MG tablet Take 1 tablet (10 mg total) by mouth daily.  Marland Kitchen atorvastatin (LIPITOR) 80 MG tablet Take 1 tablet (80 mg total) by mouth daily at 6 PM.  . azelastine (ASTELIN) 0.1 % nasal spray Place  2 sprays into both nostrils 2 (two) times daily. Use in each nostril as directed  . blood glucose meter kit and supplies Dispense based on patient and insurance preference. Use up to four times daily as directed. DX E11.59  . docusate sodium (COLACE) 100 MG capsule Take 1 capsule (100 mg total) by mouth 2 (two) times daily.  Marland Kitchen doxycycline (VIBRA-TABS) 100 MG tablet Take 1 tablet (100 mg total) by mouth 2 (two) times daily.  Marland Kitchen escitalopram (LEXAPRO) 10 MG tablet Take 1 tablet (10 mg total) by mouth daily.  . hydrALAZINE (APRESOLINE) 10 MG tablet Take 1 tablet (10 mg total) by mouth 2 (two) times daily.  . insulin aspart (NOVOLOG FLEXPEN) 100 UNIT/ML FlexPen Use as directed with sliding scale. (max dose 30 units per day)  . insulin glargine (LANTUS) 100 UNIT/ML Solostar Pen Inject 10 Units into the skin at bedtime.  . insulin lispro (HUMALOG) 100 UNIT/ML injection Inject 0.02 mLs (2 Units total) into the skin 3 (three) times daily with meals. Sliding scale  given  And sample given  . Insulin Pen Needle (PEN NEEDLES 31GX5/16") 31G X 8 MM MISC Use as directed with Lantus and Novolog pen  . levothyroxine (SYNTHROID) 25 MCG tablet TAKE 1 TABLET (25 MCG TOTAL) BY MOUTH DAILY BEFORE BREAKFAST. (Patient taking differently: Take 25 mcg by mouth daily before breakfast. )  . metFORMIN (GLUCOPHAGE) 1000 MG tablet Take 1 tablet (1,000 mg total) by mouth 2 (two) times daily with a meal.  . methocarbamol (ROBAXIN) 500 MG tablet Take 1 tablet (500 mg total) by mouth every 6 (six) hours as needed for muscle spasms.  . metoCLOPramide (REGLAN) 5 MG tablet Take 1 tablet (5 mg total) by mouth 4 (four) times daily -  before meals and at bedtime.  . Multiple Vitamin (MULTIVITAMIN WITH MINERALS) TABS tablet Take 1 tablet by mouth daily.  Marland Kitchen oxyCODONE (OXY IR/ROXICODONE) 5 MG immediate release tablet Take 1 tablet (5 mg total) by mouth every 4 (four) hours as needed for moderate pain (pain score 4-6).  Marland Kitchen  oxyCODONE-acetaminophen (PERCOCET) 5-325 MG tablet Take 1 tablet by mouth every 4 (four) hours as needed for severe pain.  . pantoprazole (PROTONIX) 40 MG tablet Take 1 tablet (40 mg total) by mouth daily.  . polyethylene glycol (MIRALAX / GLYCOLAX) 17 g packet Take 17 g by mouth daily.     Allergies:   Lyrica [pregabalin], Morphine and related, Penicillins, and Tramadol   Social History   Tobacco Use  . Smoking status: Former Smoker    Packs/day: 0.50    Quit date: 09/08/2005    Years since quitting: 14.2  . Smokeless tobacco: Never Used  . Tobacco comment: " 1 smoke a week in the past"  Substance Use Topics  . Alcohol use: No    Alcohol/week: 0.0 standard drinks  . Drug use: Not Currently     Family Hx: The patient's family history includes Alcohol abuse in her brother; Arthritis in her mother; Diabetes in her paternal grandmother; Healthy in her son; Leukemia in her brother; Stroke in her brother.  ROS:   Please see the history of present illness.    All other systems reviewed and are negative.   Prior CV studies:   The following studies were reviewed today:  LHC 10/17/ 2019 Conclusion    Ost RPDA to RPDA lesion is 40% stenosed.  RPDA-1 lesion is 50% stenosed.  RPDA-2 lesion is 80% stenosed.  Prox RCA lesion is 20% stenosed.  Mid RCA lesion is 20% stenosed.  Mid LM to Dist LM lesion is 80% stenosed.  Dist LM to Prox LAD lesion is 85% stenosed.  Prox LAD to Mid LAD lesion is 80% stenosed.  Ost 2nd Diag to 2nd Diag lesion is 95% stenosed.  Mid LAD lesion is 95% stenosed.  Dist LAD lesion is 99% stenosed.  Prox Cx to Mid Cx lesion is 60% stenosed.  LV end diastolic pressure is severely elevated.  There is moderate left ventricular systolic dysfunction.   Severe multivessel CAD with 80% distal left main stenosis, calcification of the proximal LAD with diffuse 85 to 90% stenosis before the first diagonal vessel, 80% stenoses between the first and second  diagonal vessel with 95% ostial stenosis of the second diagonal and LAD beyond the diagonal vessel; the LAD is subtotally occluded in the distal LAD after the third diagonal vessel with faint filling antegrade; 60% proximal left circumflex stenosis with 50% distal circumflex marginal stenosis; large dominant RCA with 20% tandem mid RCA stenoses, and PDA stenosis  of 40% ostially 50% in the mid segment and 80% distally.  Moderate LV dysfunction with an ejection fraction of 40 to 45% with hypocontractility involving the mid distal anterolateral wall extending to the apex.  LVEDP is elevated at 29 mm   Echocardiogram 2019 Left ventricle: The cavity size was normal. Wall thickness was  increased in a pattern of mild LVH. Systolic function was low  normal to mildly reduced. The estimated ejection fraction was in  the range of 50% to 55%. Anterior and anterolateral hypokinesis.  Features are consistent with a pseudonormal left ventricular  filling pattern, with concomitant abnormal relaxation and  increased filling pressure (grade 2 diastolic dysfunction).  - Aortic valve: There was no stenosis.  - Mitral valve: There was no significant regurgitation.  - Left atrium: The atrium was mildly dilated.  - Right ventricle: The cavity size was normal. Systolic function  was normal.  - Pulmonary arteries: No complete TR doppler jet so unable to  estimate PA systolic pressure.  - Systemic veins: IVC measured 1.8 cm with < 50% respirophasic  variation, suggesting RA pressure 8 mmHg.   Labs/Other Tests and Data Reviewed:    EKG:  No ECG reviewed.  Recent Labs: 12/16/2018: Magnesium 2.1 11/25/2019: ALT 11; BUN 17; Creatinine, Ser 0.89; Hemoglobin 10.0; Platelets 501.0; Potassium 4.3; Sodium 134   Recent Lipid Panel Lab Results  Component Value Date/Time   CHOL 237 (H) 11/25/2019 12:07 PM   TRIG 150.0 (H) 11/25/2019 12:07 PM   HDL 47.40 11/25/2019 12:07 PM   CHOLHDL 5 11/25/2019 12:07  PM   LDLCALC 159 (H) 11/25/2019 12:07 PM   LDLDIRECT 133.0 03/02/2017 08:38 AM    Wt Readings from Last 3 Encounters:  12/15/19 140 lb (63.5 kg)  12/06/19 132 lb (59.9 kg)  11/28/19 132 lb (59.9 kg)     Objective:    Vital Signs:  Ht 5' 2"  (1.575 m)   Wt 140 lb (63.5 kg)   LMP 02/12/2011 (Exact Date)   BMI 25.61 kg/m    VITAL SIGNS:  reviewed GEN:  no acute distress NEURO:  alert and oriented x 3, no obvious focal deficit PSYCH:  normal affect  ASSESSMENT & PLAN:    1. Hypertension;  She is unable to give me accurate readings as she is not taking them herself. She has someone come in to take them daily and make sure she has her medications. She reports that they tell her that her BP is "okay."  No changes in her regimen. She is seeing her PCP next week for labs and medication refills.   2. Chronic CHF: She denies dyspnea, weight gain, edema. She is adhering ot a low sodium diet. She has help in her home with this. Will need to be seen in person on follow up for full assessment.   3. CAD: Hx of CABG times 4 in 2019. Maintain optimal medication regimen with BP control and LDL control. She offers no complaints of chest pain or cardiac symptoms at this time.   4. Hyperlipidemia: Continue atorvastatin. Goal of LDL < 70. Labs per PCP next week.   5. Type II diabetes: She is followed by PCP for this.   COVID-19 Education: The signs and symptoms of COVID-19 were discussed with the patient and how to seek care for testing (follow up with PCP or arrange E-visit).  The importance of social distancing was discussed today.  Time:   Today, I have spent 52mnutes with the patient with telehealth technology discussing  the above problems.     Medication Adjustments/Labs and Tests Ordered: Current medicines are reviewed at length with the patient today.  Concerns regarding medicines are outlined above.   Tests Ordered: No orders of the defined types were placed in this  encounter.   Medication Changes: No orders of the defined types were placed in this encounter.   Disposition:  Follow up 6 months.   Signed, Phill Myron. West Pugh, ANP, AACC  12/15/2019 8:56 AM    [facility]

## 2019-12-15 ENCOUNTER — Encounter: Payer: Self-pay | Admitting: Adult Health

## 2019-12-15 ENCOUNTER — Telehealth: Payer: Self-pay | Admitting: Orthopedic Surgery

## 2019-12-15 ENCOUNTER — Ambulatory Visit (INDEPENDENT_AMBULATORY_CARE_PROVIDER_SITE_OTHER): Payer: BC Managed Care – PPO | Admitting: Physician Assistant

## 2019-12-15 ENCOUNTER — Telehealth (INDEPENDENT_AMBULATORY_CARE_PROVIDER_SITE_OTHER): Payer: BC Managed Care – PPO | Admitting: Adult Health

## 2019-12-15 ENCOUNTER — Encounter: Payer: Self-pay | Admitting: Physician Assistant

## 2019-12-15 ENCOUNTER — Other Ambulatory Visit: Payer: Self-pay

## 2019-12-15 ENCOUNTER — Telehealth: Payer: Self-pay | Admitting: Family Medicine

## 2019-12-15 VITALS — Ht 62.0 in | Wt 140.0 lb

## 2019-12-15 DIAGNOSIS — E785 Hyperlipidemia, unspecified: Secondary | ICD-10-CM

## 2019-12-15 DIAGNOSIS — I10 Essential (primary) hypertension: Secondary | ICD-10-CM | POA: Diagnosis not present

## 2019-12-15 DIAGNOSIS — I2510 Atherosclerotic heart disease of native coronary artery without angina pectoris: Secondary | ICD-10-CM

## 2019-12-15 DIAGNOSIS — I739 Peripheral vascular disease, unspecified: Secondary | ICD-10-CM

## 2019-12-15 DIAGNOSIS — E1159 Type 2 diabetes mellitus with other circulatory complications: Secondary | ICD-10-CM

## 2019-12-15 DIAGNOSIS — I5032 Chronic diastolic (congestive) heart failure: Secondary | ICD-10-CM

## 2019-12-15 DIAGNOSIS — T8149XA Infection following a procedure, other surgical site, initial encounter: Secondary | ICD-10-CM

## 2019-12-15 MED ORDER — DOXYCYCLINE HYCLATE 100 MG PO TABS: 100.0000 mg | ORAL_TABLET | Freq: Two times a day (BID) | ORAL | 0 refills | Status: DC

## 2019-12-15 MED ORDER — OXYCODONE-ACETAMINOPHEN 5-325 MG PO TABS: 1.0000 | ORAL_TABLET | Freq: Three times a day (TID) | ORAL | 0 refills | Status: DC | PRN

## 2019-12-15 NOTE — Telephone Encounter (Signed)
Patient is calling in regards to short acting insulin, patient needs a refill on that medication.   Pt is unsure on the medication name.  Please advise

## 2019-12-15 NOTE — Telephone Encounter (Signed)
Received request for records from [facility]. I returned advised they need to send authorization before we can release records.

## 2019-12-15 NOTE — Progress Notes (Signed)
Office Visit Note   Patient: [name]           Date of Birth: 1966-01-15           MRN: [id number] Visit Date: 12/15/2019              Requested by: Mosie Lukes, MD [address] [place],  [place] [address] PCP: Mosie Lukes, MD  Chief Complaint  Patient presents with  . Left Leg - Routine Post Op    11/18/19 left revision BKA       HPI: This is a pleasant woman who is 3 weeks status post left revision below-knee amputation.  She is here with her caregiver today.  She was switched to Silvadene dressing changes.  Her caregiver thinks she may have had a reaction to this and actually had more redness and the wound did not look as good.  Of note her fasting blood sugars have been in the 200s.  Her caregiver has been working with her primary care doctor's office on adjusting her insulin.  Her most recent hemoglobin A1c a couple weeks ago was 13 her protein albumin was 2.8 she has been doing protein shakes  Assessment & Plan: Visit Diagnoses:  1. Wound infection after surgery     Plan: Her albumin has slightly improved however I did discuss with him the need to continue pushing protein.  With regards to her diabetic control this is crucial in involved with her wound healing.  I do have some concerns about the lateral aspect of her wound.  I will switch her to dry dressing changes..  She will try and use the shrinker against the skin if possible follow-up with Dr. [name] Given in 1 week I have also given her a prescription for some doxycycline  Follow-Up Instructions: No follow-ups on file.   Ortho Exam  Patient is alert, oriented, no adenopathy, well-dressed, normal affect, normal respiratory effort. Lateral aspect of the BKA stump has some wound dehiscence with some fibrinous tissue.  She does have some erythema adjacent to the wound but no fluctuance and no ascending cellulitis.  There is no foul odor.  She does have some necrotic wound edges.  Medial side of the  amputation stump has healed well  Imaging: No results found. No images are attached to the encounter.  Labs: Lab Results  Component Value Date   HGBA1C 13.0 (H) 11/25/2019   HGBA1C 14.1 (H) 09/21/2019   HGBA1C 10.5 (H) 12/12/2018   ESRSEDRATE 130 (H) 12/12/2018   ESRSEDRATE 115 (H) 06/24/2018   ESRSEDRATE 107 (H) 09/04/2016   CRP 28.2 (H) 12/12/2018   CRP 7.2 (H) 09/04/2016   CRP 0.7 03/21/2015   LABURIC 3.2 06/13/2014   LABURIC 3.1 02/09/2014   REPTSTATUS 10/08/2019 FINAL 10/06/2019   GRAMSTAIN  12/13/2018    FEW WBC PRESENT,BOTH PMN AND MONONUCLEAR NO ORGANISMS SEEN    CULT >=100,000 COLONIES/mL CITROBACTER FREUNDII (A) 10/06/2019   LABORGA CITROBACTER FREUNDII (A) 10/06/2019     Lab Results  Component Value Date   ALBUMIN 2.8 (L) 11/25/2019   ALBUMIN 2.2 (L) 10/11/2019   ALBUMIN 2.1 (L) 09/28/2019   PREALBUMIN 12.7 (L) 09/04/2016   LABURIC 3.2 06/13/2014   LABURIC 3.1 02/09/2014    Lab Results  Component Value Date   MG 2.1 12/16/2018   MG 2.8 (H) 06/29/2018   MG 3.0 (H) 06/29/2018   No results found for: [facility]  Lab Results  Component Value Date  PREALBUMIN 12.7 (L) 09/04/2016   CBC EXTENDED Latest Ref Rng & Units 11/25/2019 10/11/2019 09/28/2019  WBC 4.0 - 10.5 K/uL 10.2 8.3 9.6  RBC 3.87 - 5.11 Mil/uL 3.66(L) 3.52(L) 3.98  HGB 12.0 - 15.0 g/dL 10.0(L) 9.5(L) 10.7(L)  HCT 36.0 - 46.0 % 30.8(L) 29.8(L) 34.4(L)  PLT 150.0 - 400.0 K/uL 501.0(H) 654(H) 640(H)  NEUTROABS 1.4 - 7.7 K/uL 7.7 4.5 7.1  LYMPHSABS 0.7 - 4.0 K/uL 1.6 2.5 1.4     Body mass index is 25.61 kg/m.  Orders:  No orders of the defined types were placed in this encounter.  Meds ordered this encounter  Medications  . oxyCODONE-acetaminophen (PERCOCET) 5-325 MG tablet    Sig: Take 1 tablet by mouth every 8 (eight) hours as needed for severe pain.    Dispense:  20 tablet    Refill:  0  . doxycycline (VIBRA-TABS) 100 MG tablet    Sig: Take 1 tablet (100 mg total) by mouth 2 (two)  times daily.    Dispense:  20 tablet    Refill:  0     Procedures: No procedures performed  Clinical Data: No additional findings.  ROS:  All other systems negative, except as noted in the HPI. Review of Systems  Objective: Vital Signs: Ht 5\' 2"  (1.575 m)   Wt 140 lb (63.5 kg)   LMP 02/12/2011 (Exact Date)   BMI 25.61 kg/m   Specialty Comments:  No specialty comments available.  PMFS History: Patient Active Problem List   Diagnosis Date Noted  . Uncontrolled type 2 diabetes mellitus with hyperglycemia ([place]) 11/25/2019  . Wound infection after surgery 11/25/2019  . Dehiscence of amputation stump ([place])   . Adjustment disorder with mixed anxiety and depressed mood   . S/P bilateral BKA (below knee amputation) ([place]) 09/27/2019  . Acute osteomyelitis of left foot ([place]) 09/21/2019  . Subacute osteomyelitis, left ankle and foot ([place])   . Pressure injury of left heel, unstageable ([place]) 02/09/2019  . Hypoglycemia   . Diabetes mellitus type 2 in nonobese (HCC)   . Labile blood pressure   . Labile blood glucose   . Phantom limb pain ([place])   . CKD (chronic kidney disease), stage III   . Hypothyroidism   . Essential hypertension   . Chronic diastolic congestive heart failure ([place])   . Coronary artery disease involving coronary bypass graft of native heart without angina pectoris   . Poorly controlled type 2 diabetes mellitus with peripheral neuropathy ([place])   . Acute on chronic anemia   . History of below knee amputation, right ([place]) 12/31/2018  . S/P CABG x 3 06/28/2018  . Coronary artery disease involving native coronary artery of native heart with angina pectoris ([place]) 06/25/2018  . Type 2 diabetes mellitus with vascular disease ([place]) 06/24/2018  . Acute on chronic diastolic CHF (congestive heart failure) ([place]) 06/24/2018  . Non-ST elevation (NSTEMI) myocardial infarction ([place]) 06/24/2018  . Chest pain 06/23/2018  . Glaucoma 09/17/2017  . Diabetic retinopathy ([place])  09/17/2017  . Otitis media 11/10/2016  . Decreased visual acuity 11/10/2016  . Spinal stenosis, lumbar region with neurogenic claudication 10/22/2016  . Other spondylosis with radiculopathy, lumbar region 10/13/2016  . Lipoma of abdominal wall 10/05/2016  . Severe protein-calorie malnutrition ([place]) 09/04/2016  . Anemia of chronic disease 09/04/2016  . Diabetic polyneuropathy associated with type 2 diabetes mellitus ([place]) 05/07/2016  . Orthostatic hypotension 05/07/2016  . Cerebrovascular disease 04/27/2016  . N&V (nausea and vomiting) 04/01/2016  . ICH (  intracerebral hemorrhage) ([place]) 03/30/2016  . Visit for preventive health examination 05/21/2015  . Breast cancer screening 05/21/2015  . Arm lesion 07/02/2014  . Pain in limb 01/13/2014  . Neuropathic pain of both legs 01/13/2014  . Hip pain 01/12/2014  . Allergic rhinitis 03/10/2013  . Back pain 03/08/2013  . Depression 12/18/2012  . PVD (peripheral vascular disease) ([place]) 01/21/2012  . Hyperlipidemia 12/06/2010  . Overweight(278.02) 12/06/2010  . RESTLESS LEG SYNDROME 10/25/2010  . Migraine without aura 10/07/2010  . Hereditary and idiopathic peripheral neuropathy 10/07/2010  . Essential hypertension, benign 10/07/2010  . DISTURBANCE OF SKIN SENSATION 10/07/2010  . HEART MURMUR, HX OF 10/07/2010  . NEPHROLITHIASIS, HX OF 10/07/2010   Past Medical History:  Diagnosis Date  . Abscess of great toe, right   . Acute on chronic diastolic CHF (congestive heart failure) ([place]) 06/24/2018  . Acute osteomyelitis of toe, left ([place]) 09/05/2016  . Amputation of right great toe ([place]) 12/22/2018  . Anxiety   . Cataract    left - surgery to remove  . Cellulitis of foot, right 12/11/2018  . COMMON MIGRAINE 10/07/2010  . Coronary artery disease   . Decreased visual acuity 11/10/2016  . Depression 12/18/2012  . Diabetes mellitus type II, uncontrolled ([place]) 10/07/2010   Qualifier: Diagnosis of  By: Charlett Blake MD, [name]    . Diabetic foot infection  ([place]) 08/26/2016  . Diabetic infection of right foot ([place])   . Disturbance of skin sensation 10/07/2010  . Gangrene of right foot ([place])   . GERD (gastroesophageal reflux disease)   . Heart murmur   . History of kidney stones    "years ago" - passed stones  . Hyperlipidemia 12/06/2010  . Hypertension   . Hypothyroidism   . Lipoma of abdominal wall 10/05/2016  . Overweight(278.02) 12/06/2010  . PERIPHERAL NEUROPATHY, FEET 10/07/2010   11/17/2019  . PVD (peripheral vascular disease) ([place]) 01/21/2012  . RESTLESS LEG SYNDROME 10/25/2010  . Stroke [facility]) 2014, 2017   most recently in 2/17 - intracerebral hemorrhage    Family History  Problem Relation Age of Onset  . Arthritis Mother   . Stroke Brother        previous smoker  . Alcohol abuse Brother        in remission  . Leukemia Brother   . Diabetes Paternal Grandmother   . Healthy Son     Past Surgical History:  Procedure Laterality Date  . AMPUTATION Right 12/13/2018   Procedure: AMPUTATION RIGHT GREAT TOE, LOCAL RELOCATION OF TISSUE FOR WOUND CLOSURE 9cm x 3cm, VAC APPLICATION;  Surgeon: [name], MD;  Location: [facility];  Service: Orthopedics;  Laterality: Right;  . AMPUTATION Right 12/31/2018   Procedure: RIGHT BELOW KNEE AMPUTATION;  Surgeon: [name], MD;  Location: [facility];  Service: Orthopedics;  Laterality: Right;  . AMPUTATION Left 09/21/2019   Procedure: LEFT BELOW KNEE AMPUTATION;  Surgeon: [name], MD;  Location: [facility];  Service: Orthopedics;  Laterality: Left;  . AMPUTATION TOE Left 09/09/2016   Procedure: AMPUTATION OF LEFT GREAT TOE;  Surgeon: Milly Jakob, MD;  Location: [place];  Service: Orthopedics;  Laterality: Left;  . BELOW KNEE LEG AMPUTATION Left 09/21/2019  . CARDIAC CATHETERIZATION  06/24/2018  . CORONARY ARTERY BYPASS GRAFT N/A 06/28/2018   Procedure: CORONARY ARTERY BYPASS GRAFTING (CABG) times  four, using left internal mammary artery, endoscopically harvested right saphenous  vein, and harvested left radial artery;  Surgeon: [name], MD;  Location:  [facility];  Service: Open Heart Surgery;  Laterality: N/A;  . ENDOVEIN HARVEST OF GREATER SAPHENOUS VEIN Right 06/28/2018   Procedure: ENDOVEIN HARVEST OF GREATER SAPHENOUS VEIN;  Surgeon: [name], MD;  Location: [facility];  Service: Open Heart Surgery;  Laterality: Right;  . EYE SURGERY Left 06/2017   [facility]  . LEFT HEART CATH AND CORONARY ANGIOGRAPHY N/A 06/24/2018   Procedure: LEFT HEART CATH AND CORONARY ANGIOGRAPHY;  Surgeon: Troy Sine, MD;  Location: [facility];  Service: Cardiovascular;  Laterality: N/A;  . RADIAL ARTERY HARVEST Left 06/28/2018   Procedure: RADIAL ARTERY HARVEST;  Surgeon: [name], MD;  Location: [facility];  Service: Open Heart Surgery;  Laterality: Left;  . STUMP REVISION Left 11/18/2019   Procedure: REVISION LEFT BELOW KNEE AMPUTATION;  Surgeon: [name], MD;  Location: [place];  Service: Orthopedics;  Laterality: Left;  . TEE WITHOUT CARDIOVERSION N/A 06/28/2018   Procedure: TRANSESOPHAGEAL ECHOCARDIOGRAM (TEE);  Surgeon: [name], MD;  Location: [place];  Service: Open Heart Surgery;  Laterality: N/A;  . WISDOM TOOTH EXTRACTION     Social History   Occupational History  . Occupation: Glass blower/designer  Tobacco Use  . Smoking status: Former Smoker    Packs/day: 0.50    Quit date: 09/08/2005    Years since quitting: 14.2  . Smokeless tobacco: Never Used  . Tobacco comment: " 1 smoke a week in the past"  Substance and Sexual Activity  . Alcohol use: No    Alcohol/week: 0.0 standard drinks  . Drug use: Not Currently  . Sexual activity: Yes    Partners: Male    Birth control/protection: Post-menopausal

## 2019-12-15 NOTE — Patient Instructions (Signed)
Medication Instructions:  Conti-nue current medications  *If you need a refill on your cardiac medications before your next appointment, please call your pharmacy*   Lab Work: None Ordered  If you have labs (blood work) drawn today and your tests are completely normal, you will receive your results only by: Marland Kitchen MyChart Message (if you have MyChart) OR . A paper copy in the mail If you have any lab test that is abnormal or we need to change your treatment, we will call you to review the results.   Testing/Procedures: None Ordered   Follow-Up: At [facility], you and your health needs are our priority.  As part of our continuing mission to provide you with exceptional heart care, we have created designated Provider Care Teams.  These Care Teams include your primary Cardiologist (physician) and Advanced Practice Providers (APPs -  Physician Assistants and Nurse Practitioners) who all work together to provide you with the care you need, when you need it.  We recommend signing up for the patient portal called "MyChart".  Sign up information is provided on this After Visit Summary.  MyChart is used to connect with patients for Virtual Visits (Telemedicine).  Patients are able to view lab/test results, encounter notes, upcoming appointments, etc.  Non-urgent messages can be sent to your provider as well.   To learn more about what you can do with MyChart, go to [email or web address].    Your next appointment:   6 month(s)  The format for your next appointment:   In Person  Provider:   You may see Elouise Munroe, MD or one of the following Advanced Practice Providers on your designated Care Team:    Rosaria Ferries, PA-C  Jory Sims, DNP, ANP  Cadence Kathlen Mody, NP

## 2019-12-16 NOTE — Telephone Encounter (Signed)
Patient stated that she already picked up medication.

## 2019-12-20 ENCOUNTER — Encounter: Payer: Self-pay | Admitting: Orthopedic Surgery

## 2019-12-20 ENCOUNTER — Telehealth: Payer: Self-pay | Admitting: Orthopedic Surgery

## 2019-12-20 ENCOUNTER — Ambulatory Visit: Payer: BC Managed Care – PPO | Admitting: Physician Assistant

## 2019-12-20 ENCOUNTER — Other Ambulatory Visit: Payer: Self-pay

## 2019-12-20 ENCOUNTER — Ambulatory Visit (INDEPENDENT_AMBULATORY_CARE_PROVIDER_SITE_OTHER): Payer: BC Managed Care – PPO | Admitting: Orthopedic Surgery

## 2019-12-20 VITALS — Ht 62.0 in | Wt 140.0 lb

## 2019-12-20 DIAGNOSIS — Z89512 Acquired absence of left leg below knee: Secondary | ICD-10-CM

## 2019-12-20 NOTE — Progress Notes (Signed)
Office Visit Note   Patient: [name]           Date of Birth: 07-21-1966           MRN: [id number] Visit Date: 12/20/2019              Requested by: Mosie Lukes, MD [address] [place],  [place] [address] PCP: Mosie Lukes, MD  Chief Complaint  Patient presents with  . Left Leg - Routine Post Op    11/18/19 revision left BKA       HPI: The patient is a 54 year old woman seen today for evaluation of her left below-knee amputation.  She has been doing dry dressing changes she does have some help at home.  Today she is nauseated and drowsy  Assessment & Plan: Visit Diagnoses:  No diagnosis found.  Plan: She will resume her Silvadene dressing changes to this every other day dry dressing changes every other day.  Call for any worsening.  Follow-Up Instructions: Return in about 2 weeks (around 01/03/2020).   Ortho Exam  Patient is alert, oriented, no adenopathy, well-dressed, normal affect, normal respiratory effort. Lateral aspect of the BKA stump has some wound dehiscence with full fibrinous tissue.  She does have some erythema adjacent to the wound but no fluctuance and no ascending cellulitis.  No warmth there is no foul odor.  She does have some necrotic wound edges.  Eschar was debrided with a 10 blade knife back to viable tissue.  The remaining sutures were harvested today as well.  Patient tolerated well.    Imaging: No results found. No images are attached to the encounter.  Labs: Lab Results  Component Value Date   HGBA1C 13.0 (H) 11/25/2019   HGBA1C 14.1 (H) 09/21/2019   HGBA1C 10.5 (H) 12/12/2018   ESRSEDRATE 130 (H) 12/12/2018   ESRSEDRATE 115 (H) 06/24/2018   ESRSEDRATE 107 (H) 09/04/2016   CRP 28.2 (H) 12/12/2018   CRP 7.2 (H) 09/04/2016   CRP 0.7 03/21/2015   LABURIC 3.2 06/13/2014   LABURIC 3.1 02/09/2014   REPTSTATUS 10/08/2019 FINAL 10/06/2019   GRAMSTAIN  12/13/2018    FEW WBC PRESENT,BOTH PMN AND MONONUCLEAR NO  ORGANISMS SEEN    CULT >=100,000 COLONIES/mL CITROBACTER FREUNDII (A) 10/06/2019   LABORGA CITROBACTER FREUNDII (A) 10/06/2019     Lab Results  Component Value Date   ALBUMIN 2.8 (L) 11/25/2019   ALBUMIN 2.2 (L) 10/11/2019   ALBUMIN 2.1 (L) 09/28/2019   PREALBUMIN 12.7 (L) 09/04/2016   LABURIC 3.2 06/13/2014   LABURIC 3.1 02/09/2014    Lab Results  Component Value Date   MG 2.1 12/16/2018   MG 2.8 (H) 06/29/2018   MG 3.0 (H) 06/29/2018   No results found for: VD25OH  Lab Results  Component Value Date   PREALBUMIN 12.7 (L) 09/04/2016   CBC EXTENDED Latest Ref Rng & Units 11/25/2019 10/11/2019 09/28/2019  WBC 4.0 - 10.5 K/uL 10.2 8.3 9.6  RBC 3.87 - 5.11 Mil/uL 3.66(L) 3.52(L) 3.98  HGB 12.0 - 15.0 g/dL 10.0(L) 9.5(L) 10.7(L)  HCT 36.0 - 46.0 % 30.8(L) 29.8(L) 34.4(L)  PLT 150.0 - 400.0 K/uL 501.0(H) 654(H) 640(H)  NEUTROABS 1.4 - 7.7 K/uL 7.7 4.5 7.1  LYMPHSABS 0.7 - 4.0 K/uL 1.6 2.5 1.4     Body mass index is 25.61 kg/m.  Orders:  No orders of the defined types were placed in this encounter.  No orders of the defined types were placed in  this encounter.    Procedures: No procedures performed  Clinical Data: No additional findings.  ROS:  All other systems negative, except as noted in the HPI. Review of Systems  Constitutional: Negative for chills and fever.  Cardiovascular: Negative for leg swelling.  Skin: Positive for wound.    Objective: Vital Signs: Ht 5\' 2"  (1.575 m)   Wt 140 lb (63.5 kg)   LMP 02/12/2011 (Exact Date)   BMI 25.61 kg/m   Specialty Comments:  No specialty comments available.  PMFS History: Patient Active Problem List   Diagnosis Date Noted  . Uncontrolled type 2 diabetes mellitus with hyperglycemia ([place]) 11/25/2019  . Wound infection after surgery 11/25/2019  . Dehiscence of amputation stump ([place])   . Adjustment disorder with mixed anxiety and depressed mood   . S/P bilateral BKA (below knee amputation) ([place]) 09/27/2019    . Acute osteomyelitis of left foot ([place]) 09/21/2019  . Subacute osteomyelitis, left ankle and foot ([place])   . Pressure injury of left heel, unstageable ([place]) 02/09/2019  . Hypoglycemia   . Diabetes mellitus type 2 in nonobese (HCC)   . Labile blood pressure   . Labile blood glucose   . Phantom limb pain ([place])   . CKD (chronic kidney disease), stage III   . Hypothyroidism   . Essential hypertension   . Chronic diastolic congestive heart failure ([place])   . Coronary artery disease involving coronary bypass graft of native heart without angina pectoris   . Poorly controlled type 2 diabetes mellitus with peripheral neuropathy ([place])   . Acute on chronic anemia   . History of below knee amputation, right ([place]) 12/31/2018  . S/P CABG x 3 06/28/2018  . Coronary artery disease involving native coronary artery of native heart with angina pectoris ([place]) 06/25/2018  . Type 2 diabetes mellitus with vascular disease ([place]) 06/24/2018  . Acute on chronic diastolic CHF (congestive heart failure) ([place]) 06/24/2018  . Non-ST elevation (NSTEMI) myocardial infarction ([place]) 06/24/2018  . Chest pain 06/23/2018  . Glaucoma 09/17/2017  . Diabetic retinopathy ([place]) 09/17/2017  . Otitis media 11/10/2016  . Decreased visual acuity 11/10/2016  . Spinal stenosis, lumbar region with neurogenic claudication 10/22/2016  . Other spondylosis with radiculopathy, lumbar region 10/13/2016  . Lipoma of abdominal wall 10/05/2016  . Severe protein-calorie malnutrition ([place]) 09/04/2016  . Anemia of chronic disease 09/04/2016  . Diabetic polyneuropathy associated with type 2 diabetes mellitus ([place]) 05/07/2016  . Orthostatic hypotension 05/07/2016  . Cerebrovascular disease 04/27/2016  . N&V (nausea and vomiting) 04/01/2016  . ICH (intracerebral hemorrhage) ([place]) 03/30/2016  . Visit for preventive health examination 05/21/2015  . Breast cancer screening 05/21/2015  . Arm lesion 07/02/2014  . Pain in limb 01/13/2014  .  Neuropathic pain of both legs 01/13/2014  . Hip pain 01/12/2014  . Allergic rhinitis 03/10/2013  . Back pain 03/08/2013  . Depression 12/18/2012  . PVD (peripheral vascular disease) ([place]) 01/21/2012  . Hyperlipidemia 12/06/2010  . Overweight(278.02) 12/06/2010  . RESTLESS LEG SYNDROME 10/25/2010  . Migraine without aura 10/07/2010  . Hereditary and idiopathic peripheral neuropathy 10/07/2010  . Essential hypertension, benign 10/07/2010  . DISTURBANCE OF SKIN SENSATION 10/07/2010  . HEART MURMUR, HX OF 10/07/2010  . NEPHROLITHIASIS, HX OF 10/07/2010   Past Medical History:  Diagnosis Date  . Abscess of great toe, right   . Acute on chronic diastolic CHF (congestive heart failure) ([place]) 06/24/2018  . Acute osteomyelitis of toe, left ([place]) 09/05/2016  . Amputation of right great toe (  [place]) 12/22/2018  . Anxiety   . Cataract    left - surgery to remove  . Cellulitis of foot, right 12/11/2018  . COMMON MIGRAINE 10/07/2010  . Coronary artery disease   . Decreased visual acuity 11/10/2016  . Depression 12/18/2012  . Diabetes mellitus type II, uncontrolled ([place]) 10/07/2010   Qualifier: Diagnosis of  By: Charlett Blake MD, [name]    . Diabetic foot infection ([place]) 08/26/2016  . Diabetic infection of right foot ([place])   . Disturbance of skin sensation 10/07/2010  . Gangrene of right foot ([place])   . GERD (gastroesophageal reflux disease)   . Heart murmur   . History of kidney stones    "years ago" - passed stones  . Hyperlipidemia 12/06/2010  . Hypertension   . Hypothyroidism   . Lipoma of abdominal wall 10/05/2016  . Overweight(278.02) 12/06/2010  . PERIPHERAL NEUROPATHY, FEET 10/07/2010   11/17/2019  . PVD (peripheral vascular disease) ([place]) 01/21/2012  . RESTLESS LEG SYNDROME 10/25/2010  . Stroke [facility]) 2014, 2017   most recently in 2/17 - intracerebral hemorrhage    Family History  Problem Relation Age of Onset  . Arthritis Mother   . Stroke Brother        previous smoker  . Alcohol abuse  Brother        in remission  . Leukemia Brother   . Diabetes Paternal Grandmother   . Healthy Son     Past Surgical History:  Procedure Laterality Date  . AMPUTATION Right 12/13/2018   Procedure: AMPUTATION RIGHT GREAT TOE, LOCAL RELOCATION OF TISSUE FOR WOUND CLOSURE 9cm x 3cm, VAC APPLICATION;  Surgeon: [name], MD;  Location: [facility];  Service: Orthopedics;  Laterality: Right;  . AMPUTATION Right 12/31/2018   Procedure: RIGHT BELOW KNEE AMPUTATION;  Surgeon: [name], MD;  Location: [facility];  Service: Orthopedics;  Laterality: Right;  . AMPUTATION Left 09/21/2019   Procedure: LEFT BELOW KNEE AMPUTATION;  Surgeon: [name], MD;  Location: [facility];  Service: Orthopedics;  Laterality: Left;  . AMPUTATION TOE Left 09/09/2016   Procedure: AMPUTATION OF LEFT GREAT TOE;  Surgeon: Milly Jakob, MD;  Location: [facility] [facility];  Service: Orthopedics;  Laterality: Left;  . BELOW KNEE LEG AMPUTATION Left 09/21/2019  . CARDIAC CATHETERIZATION  06/24/2018  . CORONARY ARTERY BYPASS GRAFT N/A 06/28/2018   Procedure: CORONARY ARTERY BYPASS GRAFTING (CABG) times  four, using left internal mammary artery, endoscopically harvested right saphenous vein, and harvested left radial artery;  Surgeon: [name], MD;  Location: [facility] [place];  Service: Open Heart Surgery;  Laterality: N/A;  . ENDOVEIN HARVEST OF GREATER SAPHENOUS VEIN Right 06/28/2018   Procedure: ENDOVEIN HARVEST OF GREATER SAPHENOUS VEIN;  Surgeon: [name], MD;  Location: [facility];  Service: Open Heart Surgery;  Laterality: Right;  . EYE SURGERY Left 06/2017   [facility]  . LEFT HEART CATH AND CORONARY ANGIOGRAPHY N/A 06/24/2018   Procedure: LEFT HEART CATH AND CORONARY ANGIOGRAPHY;  Surgeon: Troy Sine, MD;  Location: [facility];  Service: Cardiovascular;  Laterality: N/A;  . RADIAL ARTERY HARVEST Left 06/28/2018   Procedure: RADIAL ARTERY HARVEST;  Surgeon: [name], MD;  Location: [facility];   Service: Open Heart Surgery;  Laterality: Left;  . STUMP REVISION Left 11/18/2019   Procedure: REVISION LEFT BELOW KNEE AMPUTATION;  Surgeon: [name], MD;  Location: [facility];  Service: Orthopedics;  Laterality: Left;  . TEE WITHOUT CARDIOVERSION N/A 06/28/2018  Procedure: TRANSESOPHAGEAL ECHOCARDIOGRAM (TEE);  Surgeon: [name], MD;  Location: [facility];  Service: Open Heart Surgery;  Laterality: N/A;  . WISDOM TOOTH EXTRACTION     Social History   Occupational History  . Occupation: Glass blower/designer  Tobacco Use  . Smoking status: Former Smoker    Packs/day: 0.50    Quit date: 09/08/2005    Years since quitting: 14.2  . Smokeless tobacco: Never Used  . Tobacco comment: " 1 smoke a week in the past"  Substance and Sexual Activity  . Alcohol use: No    Alcohol/week: 0.0 standard drinks  . Drug use: Not Currently  . Sexual activity: Yes    Partners: Male    Birth control/protection: Post-menopausal

## 2019-12-20 NOTE — Telephone Encounter (Signed)
Home health nurse called.   Wanted to make [name] aware that the patient has been nauseous all day.   Call back: [phone]

## 2019-12-22 DIAGNOSIS — I5032 Chronic diastolic (congestive) heart failure: Secondary | ICD-10-CM | POA: Diagnosis not present

## 2019-12-22 DIAGNOSIS — G546 Phantom limb syndrome with pain: Secondary | ICD-10-CM | POA: Diagnosis not present

## 2019-12-22 DIAGNOSIS — E1142 Type 2 diabetes mellitus with diabetic polyneuropathy: Secondary | ICD-10-CM | POA: Diagnosis not present

## 2019-12-22 DIAGNOSIS — I130 Hypertensive heart and chronic kidney disease with heart failure and stage 1 through stage 4 chronic kidney disease, or unspecified chronic kidney disease: Secondary | ICD-10-CM | POA: Diagnosis not present

## 2019-12-22 DIAGNOSIS — I2510 Atherosclerotic heart disease of native coronary artery without angina pectoris: Secondary | ICD-10-CM | POA: Diagnosis not present

## 2019-12-22 DIAGNOSIS — D631 Anemia in chronic kidney disease: Secondary | ICD-10-CM | POA: Diagnosis not present

## 2019-12-22 DIAGNOSIS — F329 Major depressive disorder, single episode, unspecified: Secondary | ICD-10-CM | POA: Diagnosis not present

## 2019-12-22 DIAGNOSIS — E1122 Type 2 diabetes mellitus with diabetic chronic kidney disease: Secondary | ICD-10-CM | POA: Diagnosis not present

## 2019-12-22 DIAGNOSIS — N183 Chronic kidney disease, stage 3 unspecified: Secondary | ICD-10-CM | POA: Diagnosis not present

## 2019-12-22 DIAGNOSIS — Z4781 Encounter for orthopedic aftercare following surgical amputation: Secondary | ICD-10-CM | POA: Diagnosis not present

## 2019-12-22 DIAGNOSIS — E11319 Type 2 diabetes mellitus with unspecified diabetic retinopathy without macular edema: Secondary | ICD-10-CM | POA: Diagnosis not present

## 2019-12-22 DIAGNOSIS — E785 Hyperlipidemia, unspecified: Secondary | ICD-10-CM | POA: Diagnosis not present

## 2019-12-22 DIAGNOSIS — M48062 Spinal stenosis, lumbar region with neurogenic claudication: Secondary | ICD-10-CM | POA: Diagnosis not present

## 2019-12-22 DIAGNOSIS — E039 Hypothyroidism, unspecified: Secondary | ICD-10-CM | POA: Diagnosis not present

## 2019-12-22 DIAGNOSIS — M4726 Other spondylosis with radiculopathy, lumbar region: Secondary | ICD-10-CM | POA: Diagnosis not present

## 2019-12-22 DIAGNOSIS — E1151 Type 2 diabetes mellitus with diabetic peripheral angiopathy without gangrene: Secondary | ICD-10-CM | POA: Diagnosis not present

## 2019-12-23 ENCOUNTER — Ambulatory Visit: Payer: BC Managed Care – PPO | Admitting: Family Medicine

## 2019-12-23 ENCOUNTER — Telehealth: Payer: Self-pay | Admitting: *Deleted

## 2019-12-23 NOTE — Telephone Encounter (Signed)
Spoke with patient about moving appointment to Monday at 11am.  She stated that she will have to get with her caregiver to see if she can bring her and she will call us back.

## 2019-12-26 ENCOUNTER — Ambulatory Visit: Payer: BC Managed Care – PPO | Admitting: Medical

## 2019-12-26 ENCOUNTER — Ambulatory Visit: Payer: BC Managed Care – PPO | Admitting: Family Medicine

## 2019-12-27 ENCOUNTER — Ambulatory Visit: Payer: BC Managed Care – PPO | Admitting: Family Medicine

## 2019-12-27 ENCOUNTER — Ambulatory Visit: Payer: BC Managed Care – PPO | Admitting: Family

## 2019-12-29 ENCOUNTER — Other Ambulatory Visit: Payer: Self-pay

## 2019-12-30 ENCOUNTER — Ambulatory Visit (INDEPENDENT_AMBULATORY_CARE_PROVIDER_SITE_OTHER): Payer: BC Managed Care – PPO | Admitting: Medical

## 2019-12-30 VITALS — BP 180/98 | HR 88 | Ht 62.0 in

## 2019-12-30 DIAGNOSIS — I10 Essential (primary) hypertension: Secondary | ICD-10-CM | POA: Diagnosis not present

## 2019-12-30 DIAGNOSIS — E1165 Type 2 diabetes mellitus with hyperglycemia: Secondary | ICD-10-CM | POA: Diagnosis not present

## 2019-12-30 DIAGNOSIS — D638 Anemia in other chronic diseases classified elsewhere: Secondary | ICD-10-CM | POA: Diagnosis not present

## 2019-12-30 DIAGNOSIS — R1310 Dysphagia, unspecified: Secondary | ICD-10-CM | POA: Diagnosis not present

## 2019-12-30 LAB — CBC WITH DIFFERENTIAL/PLATELET
Basophils Absolute: 0.1 10*3/uL (ref 0.0–0.1)
Basophils Relative: 0.9 % (ref 0.0–3.0)
Eosinophils Absolute: 0.2 10*3/uL (ref 0.0–0.7)
Eosinophils Relative: 1.8 % (ref 0.0–5.0)
HCT: 35.2 % — ABNORMAL LOW (ref 36.0–46.0)
Hemoglobin: 11.5 g/dL — ABNORMAL LOW (ref 12.0–15.0)
Lymphocytes Relative: 19.9 % (ref 12.0–46.0)
Lymphs Abs: 1.9 10*3/uL (ref 0.7–4.0)
MCHC: 32.7 g/dL (ref 30.0–36.0)
MCV: 85.1 fl (ref 78.0–100.0)
Monocytes Absolute: 0.7 10*3/uL (ref 0.1–1.0)
Monocytes Relative: 7.5 % (ref 3.0–12.0)
Neutro Abs: 6.7 10*3/uL (ref 1.4–7.7)
Neutrophils Relative %: 69.9 % (ref 43.0–77.0)
Platelets: 496 10*3/uL — ABNORMAL HIGH (ref 150.0–400.0)
RBC: 4.14 Mil/uL (ref 3.87–5.11)
RDW: 15.6 % — ABNORMAL HIGH (ref 11.5–15.5)
WBC: 9.5 10*3/uL (ref 4.0–10.5)

## 2019-12-30 LAB — GLUCOSE, POCT (MANUAL RESULT ENTRY): POC Glucose: 189 mg/dl — AB (ref 70–99)

## 2019-12-30 LAB — FERRITIN: Ferritin: 80.2 ng/mL (ref 10.0–291.0)

## 2019-12-30 MED ORDER — AMLODIPINE BESYLATE 10 MG PO TABS: 10.0000 mg | ORAL_TABLET | Freq: Every day | ORAL | 1 refills | Status: DC

## 2019-12-30 MED ORDER — HYDRALAZINE HCL 10 MG PO TABS: 10.0000 mg | ORAL_TABLET | Freq: Two times a day (BID) | ORAL | 1 refills | Status: DC

## 2019-12-30 MED ORDER — LOSARTAN POTASSIUM 25 MG PO TABS
25.0000 mg | ORAL_TABLET | Freq: Every day | ORAL | 3 refills | Status: DC
Start: 2019-12-30 — End: 2020-03-22

## 2019-12-30 NOTE — Patient Instructions (Addendum)
Your blood sugars have improved since she started Humalog twice daily.  However your lowest blood sugar was 189 and at times you still report getting some sugars close to 300.  Appointment with endocrinologist is in May.  I think based on the above numbers you could start to titrate up on your Lantus since you are only using 10 units at night.  Went over on how to titrate up by 1unit based on fasting sugar levels in the a.m.  Also gave you a low dose sliding scale to use Humalog before meals based on premeal blood sugar level.  You do have some anemia and I want to repeat CBC today as well as iron levels.  Follow-up as regular scheduled with your PCP or as needed.  Discussion on how the ring blood sugars up if you get sugars less than 70.  Also if you get any sugars above 400 please let us know.  For htn refiling your amlodipine and hydralazine since you were on and when rx expired you got off med.

## 2019-12-30 NOTE — Progress Notes (Signed)
Subjective:    [name], female    DOB: [birth year], 54 y.o.   MRN: [id number]  HPI  Pt in for follow up.  Pt has hx of uncontrolled diabetes. Pt is going to see endocrinologist soon within next month.  Pt has been doing sliding scale meal time insulin humalog. Also on lantus. She is on 10 units at night. Pt sugar fasting today 189. Checking twice a day. At night sugar can approach high 300's at night. On review 189 is actually lowest sugar.  Current sugar is 189. Pt last a1c 1 month ago was 13.0.  Pt states she had about 4 months of difficulty swallowing. States has to eat small amounts or will get cough.     Review of Systems  Constitutional: Negative for chills, fatigue and fever.  Respiratory: Negative for cough, chest tightness, shortness of breath and wheezing.   Cardiovascular: Negative for chest pain and palpitations.  Gastrointestinal: Negative for abdominal pain, constipation, nausea and vomiting.  Musculoskeletal: Negative for back pain.  Skin: Negative for rash.  Neurological: Negative for dizziness and headaches.  Hematological: Negative for adenopathy. Does not bruise/bleed easily.  Psychiatric/Behavioral: Negative for behavioral problems and decreased concentration.   Past Medical History:  Diagnosis Date  . Abscess of great toe, right   . Acute on chronic diastolic CHF (congestive heart failure) ([place]) 06/24/2018  . Acute osteomyelitis of toe, left ([place]) 09/05/2016  . Amputation of right great toe ([place]) 12/22/2018  . Anxiety   . Cataract    left - surgery to remove  . Cellulitis of foot, right 12/11/2018  . COMMON MIGRAINE 10/07/2010  . Coronary artery disease   . Decreased visual acuity 11/10/2016  . Depression 12/18/2012  . Diabetes mellitus type II, uncontrolled ([place]) 10/07/2010   Qualifier: Diagnosis of  By: Charlett Blake MD, [name]    . Diabetic foot infection ([place]) 08/26/2016  . Diabetic infection of right foot ([place])   . Disturbance of skin  sensation 10/07/2010  . Gangrene of right foot ([place])   . GERD (gastroesophageal reflux disease)   . Heart murmur   . History of kidney stones    "years ago" - passed stones  . Hyperlipidemia 12/06/2010  . Hypertension   . Hypothyroidism   . Lipoma of abdominal wall 10/05/2016  . Overweight(278.02) 12/06/2010  . PERIPHERAL NEUROPATHY, FEET 10/07/2010   11/17/2019  . PVD (peripheral vascular disease) ([place]) 01/21/2012  . RESTLESS LEG SYNDROME 10/25/2010  . Stroke [facility]) 2014, 2017   most recently in 2/17 - intracerebral hemorrhage     Social History   Socioeconomic History  . Marital status: Single    Spouse name: Not on file  . Number of children: Not on file  . Years of education: Not on file  . Highest education level: Not on file  Occupational History  . Occupation: Glass blower/designer  Tobacco Use  . Smoking status: Former Smoker    Packs/day: 0.50    Quit date: 09/08/2005    Years since quitting: 14.3  . Smokeless tobacco: Never Used  . Tobacco comment: " 1 smoke a week in the past"  Substance and Sexual Activity  . Alcohol use: No    Alcohol/week: 0.0 standard drinks  . Drug use: Not Currently  . Sexual activity: Yes    Partners: Male    Birth control/protection: Post-menopausal  Other Topics Concern  . Not on file  Social History Narrative   Lives with husband in a two story home.  Has one child.     Works as a Glass blower/designer.     Education: high school.   Social Determinants of Health   Financial Resource Strain:   . Difficulty of Paying Living Expenses:   Food Insecurity:   . Worried About Charity fundraiser in the Last Year:   . Arboriculturist in the Last Year:   Transportation Needs:   . Film/video editor (Medical):   Marland Kitchen Lack of Transportation (Non-Medical):   Physical Activity:   . Days of Exercise per Week:   . Minutes of Exercise per Session:   Stress:   . Feeling of Stress :   Social Connections:   . Frequency of Communication with Friends and  Family:   . Frequency of Social Gatherings with Friends and Family:   . Attends Religious Services:   . Active Member of Clubs or Organizations:   . Attends Archivist Meetings:   Marland Kitchen Marital Status:   Intimate Partner Violence:   . Fear of Current or Ex-Partner:   . Emotionally Abused:   Marland Kitchen Physically Abused:   . Sexually Abused:     Past Surgical History:  Procedure Laterality Date  . AMPUTATION Right 12/13/2018   Procedure: AMPUTATION RIGHT GREAT TOE, LOCAL RELOCATION OF TISSUE FOR WOUND CLOSURE 9cm x 3cm, VAC APPLICATION;  Surgeon: [name], MD;  Location: [place];  Service: Orthopedics;  Laterality: Right;  . AMPUTATION Right 12/31/2018   Procedure: RIGHT BELOW KNEE AMPUTATION;  Surgeon: [name], MD;  Location: [place];  Service: Orthopedics;  Laterality: Right;  . AMPUTATION Left 09/21/2019   Procedure: LEFT BELOW KNEE AMPUTATION;  Surgeon: [name], MD;  Location: [place];  Service: Orthopedics;  Laterality: Left;  . AMPUTATION TOE Left 09/09/2016   Procedure: AMPUTATION OF LEFT GREAT TOE;  Surgeon: Milly Jakob, MD;  Location: [place];  Service: Orthopedics;  Laterality: Left;  . BELOW KNEE LEG AMPUTATION Left 09/21/2019  . CARDIAC CATHETERIZATION  06/24/2018  . CORONARY ARTERY BYPASS GRAFT N/A 06/28/2018   Procedure: CORONARY ARTERY BYPASS GRAFTING (CABG) times  four, using left internal mammary artery, endoscopically harvested right saphenous vein, and harvested left radial artery;  Surgeon: [name], MD;  Location: [place];  Service: Open Heart Surgery;  Laterality: N/A;  . ENDOVEIN HARVEST OF GREATER SAPHENOUS VEIN Right 06/28/2018   Procedure: ENDOVEIN HARVEST OF GREATER SAPHENOUS VEIN;  Surgeon: [name], MD;  Location: [place];  Service: Open Heart Surgery;  Laterality: Right;  . EYE SURGERY Left 06/2017   [facility]  . LEFT HEART CATH AND CORONARY ANGIOGRAPHY N/A 06/24/2018   Procedure: LEFT HEART CATH AND CORONARY  ANGIOGRAPHY;  Surgeon: Troy Sine, MD;  Location: [place] [facility];  Service: Cardiovascular;  Laterality: N/A;  . RADIAL ARTERY HARVEST Left 06/28/2018   Procedure: RADIAL ARTERY HARVEST;  Surgeon: [name], MD;  Location: [place];  Service: Open Heart Surgery;  Laterality: Left;  . STUMP REVISION Left 11/18/2019   Procedure: REVISION LEFT BELOW KNEE AMPUTATION;  Surgeon: [name], MD;  Location: [place];  Service: Orthopedics;  Laterality: Left;  . TEE WITHOUT CARDIOVERSION N/A 06/28/2018   Procedure: TRANSESOPHAGEAL ECHOCARDIOGRAM (TEE);  Surgeon: [name], MD;  Location: [place];  Service: Open Heart Surgery;  Laterality: N/A;  . WISDOM TOOTH EXTRACTION      Family History  Problem Relation Age of Onset  . Arthritis Mother   .  Stroke Brother        previous smoker  . Alcohol abuse Brother        in remission  . Leukemia Brother   . Diabetes Paternal Grandmother   . Healthy Son     Allergies  Allergen Reactions  . Lyrica [Pregabalin] Other (See Comments)    MADE PATIENT VERY EMOTIONAL AND WOULD CRY EASILY  . Morphine And Related Nausea And Vomiting  . Penicillins Other (See Comments)    "Made patient feel sick" Did it involve swelling of the face/tongue/throat, SOB, or low BP? No Did it involve sudden or severe rash/hives, skin peeling, or any reaction on the inside of your mouth or nose? No Did you need to seek medical attention at a hospital or doctor's office? No When did it last happen?April 2020 If all above answers are "NO", may proceed with cephalosporin use.   . Tramadol Nausea And Vomiting    Pt cant tolerate this pain med.     Current Outpatient Medications on File Prior to Visit  Medication Sig Dispense Refill  . acetaminophen (TYLENOL) 325 MG tablet Take 2 tablets (650 mg total) by mouth every 6 (six) hours as needed for mild pain or headache.    Marland Kitchen amLODipine (NORVASC) 10 MG tablet Take 1 tablet (10 mg total) by mouth  daily. 30 tablet 1  . atorvastatin (LIPITOR) 80 MG tablet Take 1 tablet (80 mg total) by mouth daily at 6 PM. 30 tablet 1  . azelastine (ASTELIN) 0.1 % nasal spray Place 2 sprays into both nostrils 2 (two) times daily. Use in each nostril as directed 30 mL 3  . blood glucose meter kit and supplies Dispense based on patient and insurance preference. Use up to four times daily as directed. DX E11.59 1 each 11  . docusate sodium (COLACE) 100 MG capsule Take 1 capsule (100 mg total) by mouth 2 (two) times daily. 10 capsule 0  . doxycycline (VIBRA-TABS) 100 MG tablet Take 1 tablet (100 mg total) by mouth 2 (two) times daily. (Patient not taking: Reported on 12/30/2019) 20 tablet 0  . escitalopram (LEXAPRO) 10 MG tablet Take 1 tablet (10 mg total) by mouth daily. 90 tablet 1  . hydrALAZINE (APRESOLINE) 10 MG tablet Take 1 tablet (10 mg total) by mouth 2 (two) times daily. 60 tablet 1  . insulin aspart (NOVOLOG FLEXPEN) 100 UNIT/ML FlexPen Use as directed with sliding scale. (max dose 30 units per day) 15 mL 5  . insulin glargine (LANTUS) 100 UNIT/ML Solostar Pen Inject 10 Units into the skin at bedtime. 15 mL 11  . insulin lispro (HUMALOG) 100 UNIT/ML injection Inject 0.02 mLs (2 Units total) into the skin 3 (three) times daily with meals. Sliding scale given  And sample given 10 mL 11  . Insulin Pen Needle (PEN NEEDLES 31GX5/16") 31G X 8 MM MISC Use as directed with Lantus and Novolog pen 100 each 5  . levothyroxine (SYNTHROID) 25 MCG tablet TAKE 1 TABLET (25 MCG TOTAL) BY MOUTH DAILY BEFORE BREAKFAST. (Patient taking differently: Take 25 mcg by mouth daily before breakfast. ) 90 tablet 1  . metFORMIN (GLUCOPHAGE) 1000 MG tablet Take 1 tablet (1,000 mg total) by mouth 2 (two) times daily with a meal. 180 tablet 3  . methocarbamol (ROBAXIN) 500 MG tablet Take 1 tablet (500 mg total) by mouth every 6 (six) hours as needed for muscle spasms. 60 tablet 0  . metoCLOPramide (REGLAN) 5 MG tablet Take 1 tablet (5  mg  total) by mouth 4 (four) times daily -  before meals and at bedtime. 90 tablet 0  . Multiple Vitamin (MULTIVITAMIN WITH MINERALS) TABS tablet Take 1 tablet by mouth daily.    Marland Kitchen oxyCODONE (OXY IR/ROXICODONE) 5 MG immediate release tablet Take 1 tablet (5 mg total) by mouth every 4 (four) hours as needed for moderate pain (pain score 4-6). 30 tablet 0  . oxyCODONE-acetaminophen (PERCOCET) 5-325 MG tablet Take 1 tablet by mouth every 8 (eight) hours as needed for severe pain. 20 tablet 0  . pantoprazole (PROTONIX) 40 MG tablet Take 1 tablet (40 mg total) by mouth daily. 30 tablet 0  . polyethylene glycol (MIRALAX / GLYCOLAX) 17 g packet Take 17 g by mouth daily. 14 each 0   No current facility-administered medications on file prior to visit.    BP (!) 192/62 (BP Location: Left Arm, Patient Position: Sitting, Cuff Size: Normal) Comment: 187/97  Pulse 88   Ht 5' 2" (1.575 m)   LMP 02/12/2011 (Exact Date)   SpO2 100%   BMI 25.61 kg/m       Objective:   Physical Exam   General- No acute distress. Pleasant patient. Neck- Full range of motion, no jvd Lungs- Clear, even and unlabored. Heart- regular rate and rhythm. Neurologic- CNII- XII grossly intact.        Assessment & Plan:  Your blood sugars have improved since she started Humalog twice daily.  However your lowest blood sugar was 189 and at times you still report getting some sugars close to 300.  Appointment with endocrinologist is in May.  I think based on the above numbers you could start to titrate up on your Lantus since you are only using 10 units at night.  Went over on how to titrate up by 1unit based on fasting sugar levels in the a.m.  Also gave you a low dose sliding scale to use Humalog before meals based on premeal blood sugar level.  You do have some anemia and I want to repeat CBC today as well as iron levels.  Follow-up as regular scheduled with your PCP or as needed.  Discussion on how the ring blood  sugars up if you get sugars less than 70.  Also if you get any sugars above 400 please let us know.  Time spent with patient today was 45  minutes which consisted of chart review, discussing diagnosis, work up,  Treatment. Eduction on insulin, htn management changes and documentation.

## 2019-12-31 LAB — IRON, TOTAL/TOTAL IRON BINDING CAP
%SAT: 14 % (calc) — ABNORMAL LOW (ref 16–45)
Iron: 37 ug/dL — ABNORMAL LOW (ref 45–160)
TIBC: 260 mcg/dL (calc) (ref 250–450)

## 2020-01-02 ENCOUNTER — Telehealth: Payer: Self-pay | Admitting: Medical

## 2020-01-02 MED ORDER — FERROUS SULFATE 325 (65 FE) MG PO TBEC: 325.0000 mg | DELAYED_RELEASE_TABLET | Freq: Every day | ORAL | 3 refills | Status: DC

## 2020-01-02 NOTE — Telephone Encounter (Signed)
Rx iron tablet sent to pt pharmacy.

## 2020-01-03 ENCOUNTER — Ambulatory Visit: Payer: BC Managed Care – PPO | Admitting: Orthopedic Surgery

## 2020-01-03 ENCOUNTER — Ambulatory Visit: Payer: BC Managed Care – PPO | Admitting: Family

## 2020-01-04 ENCOUNTER — Ambulatory Visit (INDEPENDENT_AMBULATORY_CARE_PROVIDER_SITE_OTHER): Payer: BC Managed Care – PPO | Admitting: Family

## 2020-01-04 ENCOUNTER — Other Ambulatory Visit: Payer: Self-pay

## 2020-01-04 ENCOUNTER — Encounter: Payer: Self-pay | Admitting: Family

## 2020-01-04 VITALS — Ht 62.0 in | Wt 140.0 lb

## 2020-01-04 DIAGNOSIS — Z89512 Acquired absence of left leg below knee: Secondary | ICD-10-CM

## 2020-01-04 NOTE — Progress Notes (Signed)
Office Visit Note   Patient: [name]           Date of Birth: [birth year]           MRN: [id number] Visit Date: 01/04/2020              Requested by: Mosie Lukes, MD [address] [place],   [id number] PCP: Mosie Lukes, MD  No chief complaint on file.     HPI: The patient is a 54 year old woman seen today for evaluation of her left below-knee amputation.  She has been doing silvadene dressing changes. she does have some help at home.    Assessment & Plan: Visit Diagnoses:  No diagnosis found.  Plan: She will continue her Silvadene dressing changes to this every other day dry dressing changes every other day.  Call for any worsening.  Follow-Up Instructions: No follow-ups on file.   Ortho Exam  Patient is alert, oriented, no adenopathy, well-dressed, normal affect, normal respiratory effort. Lateral aspect of the BKA stump has some wound dehiscence with improved granulation tissue. Few small islands of eschar. Debrided today. 25% fibrinous tissue. No active drainage or surrounding erythema.   Imaging: No results found. No images are attached to the encounter.  Labs: Lab Results  Component Value Date   HGBA1C 13.0 (H) 11/25/2019   HGBA1C 14.1 (H) 09/21/2019   HGBA1C 10.5 (H) 12/12/2018   ESRSEDRATE 130 (H) 12/12/2018   ESRSEDRATE 115 (H) 06/24/2018   ESRSEDRATE 107 (H) 09/04/2016   CRP 28.2 (H) 12/12/2018   CRP 7.2 (H) 09/04/2016   CRP 0.7 03/21/2015   LABURIC 3.2 06/13/2014   LABURIC 3.1 02/09/2014   REPTSTATUS 10/08/2019 FINAL 10/06/2019   GRAMSTAIN  12/13/2018    FEW WBC PRESENT,BOTH PMN AND MONONUCLEAR NO ORGANISMS SEEN    CULT >=100,000 COLONIES/mL CITROBACTER FREUNDII (A) 10/06/2019   LABORGA CITROBACTER FREUNDII (A) 10/06/2019     Lab Results  Component Value Date   ALBUMIN 2.8 (L) 11/25/2019   ALBUMIN 2.2 (L) 10/11/2019   ALBUMIN 2.1 (L) 09/28/2019   PREALBUMIN 12.7 (L) 09/04/2016   LABURIC 3.2 06/13/2014   LABURIC 3.1 02/09/2014    Lab Results  Component Value Date   MG 2.1 12/16/2018   MG 2.8 (H) 06/29/2018   MG 3.0 (H) 06/29/2018   No results found for: VD25OH  Lab Results  Component Value Date   PREALBUMIN 12.7 (L) 09/04/2016   CBC EXTENDED Latest Ref Rng & Units 12/30/2019 11/25/2019 10/11/2019  WBC 4.0 - 10.5 K/uL 9.5 10.2 8.3  RBC 3.87 - 5.11 Mil/uL 4.14 3.66(L) 3.52(L)  HGB 12.0 - 15.0 g/dL 11.5(L) 10.0(L) 9.5(L)  HCT 36.0 - 46.0 % 35.2(L) 30.8(L) 29.8(L)  PLT 150.0 - 400.0 K/uL 496.0(H) 501.0(H) 654(H)  NEUTROABS 1.4 - 7.7 K/uL 6.7 7.7 4.5  LYMPHSABS 0.7 - 4.0 K/uL 1.9 1.6 2.5     There is no height or weight on file to calculate BMI.  Orders:  No orders of the defined types were placed in this encounter.  No orders of the defined types were placed in this encounter.    Procedures: No procedures performed  Clinical Data: No additional findings.  ROS:  All other systems negative, except as noted in the HPI. Review of Systems  Constitutional: Negative for chills and fever.  Cardiovascular: Negative for leg swelling.  Skin: Positive for wound.    Objective: Vital Signs: LMP 02/12/2011 (Exact Date)   Specialty Comments:  No specialty  comments available.  PMFS History: Patient Active Problem List   Diagnosis Date Noted  . Uncontrolled type 2 diabetes mellitus with hyperglycemia ([place]) 11/25/2019  . Wound infection after surgery 11/25/2019  . Dehiscence of amputation stump ([place])   . Adjustment disorder with mixed anxiety and depressed mood   . S/P bilateral BKA (below knee amputation) ([place]) 09/27/2019  . Acute osteomyelitis of left foot ([place]) 09/21/2019  . Subacute osteomyelitis, left ankle and foot ([place])   . Pressure injury of left heel, unstageable ([place]) 02/09/2019  . Hypoglycemia   . Diabetes mellitus type 2 in nonobese (H[facility])   . Labile blood pressure   . Labile blood glucose   . Phantom limb pain ([place])   . CKD (chronic kidney disease), stage III     . Hypothyroidism   . Essential hypertension   . Chronic diastolic congestive heart failure ([place])   . Coronary artery disease involving coronary bypass graft of native heart without angina pectoris   . Poorly controlled type 2 diabetes mellitus with peripheral neuropathy ([place])   . Acute on chronic anemia   . History of below knee amputation, right ([place]) 12/31/2018  . S/P CABG x 3 06/28/2018  . Coronary artery disease involving native coronary artery of native heart with angina pectoris ([place]) 06/25/2018  . Type 2 diabetes mellitus with vascular disease ([place]) 06/24/2018  . Acute on chronic diastolic CHF (congestive heart failure) ([place]) 06/24/2018  . Non-ST elevation (NSTEMI) myocardial infarction ([place]) 06/24/2018  . Chest pain 06/23/2018  . Glaucoma 09/17/2017  . Diabetic retinopathy ([place]) 09/17/2017  . Otitis media 11/10/2016  . Decreased visual acuity 11/10/2016  . Spinal stenosis, lumbar region with neurogenic claudication 10/22/2016  . Other spondylosis with radiculopathy, lumbar region 10/13/2016  . Lipoma of abdominal wall 10/05/2016  . Severe protein-calorie malnutrition ([place]) 09/04/2016  . Anemia of chronic disease 09/04/2016  . Diabetic polyneuropathy associated with type 2 diabetes mellitus ([place]) 05/07/2016  . Orthostatic hypotension 05/07/2016  . Cerebrovascular disease 04/27/2016  . N&V (nausea and vomiting) 04/01/2016  . ICH (intracerebral hemorrhage) ([place]) 03/30/2016  . Visit for preventive health examination 05/21/2015  . Breast cancer screening 05/21/2015  . Arm lesion 07/02/2014  . Pain in limb 01/13/2014  . Neuropathic pain of both legs 01/13/2014  . Hip pain 01/12/2014  . Allergic rhinitis 03/10/2013  . Back pain 03/08/2013  . Depression 12/18/2012  . PVD (peripheral vascular disease) ([place]) 01/21/2012  . Hyperlipidemia 12/06/2010  . Overweight(278.02) 12/06/2010  . RESTLESS LEG SYNDROME 10/25/2010  . Migraine without aura 10/07/2010  . Hereditary and  idiopathic peripheral neuropathy 10/07/2010  . Essential hypertension, benign 10/07/2010  . DISTURBANCE OF SKIN SENSATION 10/07/2010  . HEART MURMUR, HX OF 10/07/2010  . NEPHROLITHIASIS, HX OF 10/07/2010   Past Medical History:  Diagnosis Date  . Abscess of great toe, right   . Acute on chronic diastolic CHF (congestive heart failure) ([place]) 06/24/2018  . Acute osteomyelitis of toe, left ([place]) 09/05/2016  . Amputation of right great toe ([place]) 12/22/2018  . Anxiety   . Cataract    left - surgery to remove  . Cellulitis of foot, right 12/11/2018  . COMMON MIGRAINE 10/07/2010  . Coronary artery disease   . Decreased visual acuity 11/10/2016  . Depression 12/18/2012  . Diabetes mellitus type II, uncontrolled ([place]) 10/07/2010   Qualifier: Diagnosis of  By: Charlett Blake MD, [name]    . Diabetic foot infection ([place]) 08/26/2016  . Diabetic infection of right foot ([place])   . Disturbance  of skin sensation 10/07/2010  . Gangrene of right foot ([place])   . GERD (gastroesophageal reflux disease)   . Heart murmur   . History of kidney stones    "years ago" - passed stones  . Hyperlipidemia 12/06/2010  . Hypertension   . Hypothyroidism   . Lipoma of abdominal wall 10/05/2016  . Overweight(278.02) 12/06/2010  . PERIPHERAL NEUROPATHY, FEET 10/07/2010   11/17/2019  . PVD (peripheral vascular disease) ([place]) 01/21/2012  . RESTLESS LEG SYNDROME 10/25/2010  . Stroke [facility]) 2014, 2017   most recently in 2/17 - intracerebral hemorrhage    Family History  Problem Relation Age of Onset  . Arthritis Mother   . Stroke Brother        previous smoker  . Alcohol abuse Brother        in remission  . Leukemia Brother   . Diabetes Paternal Grandmother   . Healthy Son     Past Surgical History:  Procedure Laterality Date  . AMPUTATION Right 12/13/2018   Procedure: AMPUTATION RIGHT GREAT TOE, LOCAL RELOCATION OF TISSUE FOR WOUND CLOSURE 9cm x 3cm, VAC APPLICATION;  Surgeon: [name], MD;  Location: [place];  Service:  Orthopedics;  Laterality: Right;  . AMPUTATION Right 12/31/2018   Procedure: RIGHT BELOW KNEE AMPUTATION;  Surgeon: [name], MD;  Location: [place];  Service: Orthopedics;  Laterality: Right;  . AMPUTATION Left 09/21/2019   Procedure: LEFT BELOW KNEE AMPUTATION;  Surgeon: [name], MD;  Location: [place];  Service: Orthopedics;  Laterality: Left;  . AMPUTATION TOE Left 09/09/2016   Procedure: AMPUTATION OF LEFT GREAT TOE;  Surgeon: Milly Jakob, MD;  Location: [place];  Service: Orthopedics;  Laterality: Left;  . BELOW KNEE LEG AMPUTATION Left 09/21/2019  . CARDIAC CATHETERIZATION  06/24/2018  . CORONARY ARTERY BYPASS GRAFT N/A 06/28/2018   Procedure: CORONARY ARTERY BYPASS GRAFTING (CABG) times  four, using left internal mammary artery, endoscopically harvested right saphenous vein, and harvested left radial artery;  Surgeon: [name], MD;  Location: [place];  Service: Open Heart Surgery;  Laterality: N/A;  . ENDOVEIN HARVEST OF GREATER SAPHENOUS VEIN Right 06/28/2018   Procedure: ENDOVEIN HARVEST OF GREATER SAPHENOUS VEIN;  Surgeon: [name], MD;  Location: [place];  Service: Open Heart Surgery;  Laterality: Right;  . EYE SURGERY Left 06/2017   [facility]  . LEFT HEART CATH AND CORONARY ANGIOGRAPHY N/A 06/24/2018   Procedure: LEFT HEART CATH AND CORONARY ANGIOGRAPHY;  Surgeon: Troy Sine, MD;  Location: [place] [facility];  Service: Cardiovascular;  Laterality: N/A;  . RADIAL ARTERY HARVEST Left 06/28/2018   Procedure: RADIAL ARTERY HARVEST;  Surgeon: [name], MD;  Location: [place];  Service: Open Heart Surgery;  Laterality: Left;  . STUMP REVISION Left 11/18/2019   Procedure: REVISION LEFT BELOW KNEE AMPUTATION;  Surgeon: [name], MD;  Location: [place];  Service: Orthopedics;  Laterality: Left;  . TEE WITHOUT CARDIOVERSION N/A 06/28/2018   Procedure: TRANSESOPHAGEAL ECHOCARDIOGRAM (TEE);  Surgeon: [name], MD;   Location: [place];  Service: Open Heart Surgery;  Laterality: N/A;  . WISDOM TOOTH EXTRACTION     Social History   Occupational History  . Occupation: Glass blower/designer  Tobacco Use  . Smoking status: Former Smoker    Packs/day: 0.50    Quit date: 09/08/2005    Years since quitting: 14.3  . Smokeless tobacco: Never Used  . Tobacco comment: " 1 smoke a week  in the past"  Substance and Sexual Activity  . Alcohol use: No    Alcohol/week: 0.0 standard drinks  . Drug use: Not Currently  . Sexual activity: Yes    Partners: Male    Birth control/protection: Post-menopausal

## 2020-01-05 ENCOUNTER — Ambulatory Visit: Payer: BC Managed Care – PPO | Admitting: Orthopedic Surgery

## 2020-01-06 ENCOUNTER — Ambulatory Visit: Payer: BC Managed Care – PPO | Admitting: Internal Medicine

## 2020-01-11 ENCOUNTER — Other Ambulatory Visit: Payer: Self-pay

## 2020-01-13 ENCOUNTER — Ambulatory Visit: Payer: BC Managed Care – PPO | Admitting: Internal Medicine

## 2020-01-13 DIAGNOSIS — Z0289 Encounter for other administrative examinations: Secondary | ICD-10-CM

## 2020-01-13 NOTE — Progress Notes (Deleted)
Name: [name]  MRN/ DOB: [id number], [birth year]   Age/ Sex: 54 y.o., female    PCP: Mosie Lukes, MD   Reason for Endocrinology Evaluation: Type 2 Diabetes Mellitus     Date of Initial Endocrinology Visit: 01/13/2020     PATIENT IDENTIFIER: [name] is a 54 y.o. female with a past medical history of T2DM, CAD, PVD and HTN. The patient presented for initial endocrinology clinic visit on 01/13/2020 for consultative assistance with her diabetes management.    HPI: Ms. Mcmullen was    Diagnosed with DM in 2012 Prior Medications tried/Intolerance: *** Currently checking blood sugars *** x / day,  before breakfast and ***.  Hypoglycemia episodes : ***               Symptoms: ***                 Frequency: ***/  Hemoglobin A1c has ranged from 8.3% in 2018, peaking at 14.1% in 2021. Patient required assistance for hypoglycemia:  Patient has required hospitalization within the last 1 year from hyper or hypoglycemia:   In terms of diet, the patient ***   HOME DIABETES REGIMEN: Metformin 1000 mg BID Lantus  Novolog   Statin: yes ACE-I/ARB: yes Prior Diabetic Education: {Yes/No:11203}   METER DOWNLOAD SUMMARY: Date range evaluated: *** Fingerstick Blood Glucose Tests = *** Average Number Tests/Day = *** Overall Mean FS Glucose = *** Standard Deviation = ***  BG Ranges: Low = *** High = ***   Hypoglycemic Events/30 Days: BG < 50 = *** Episodes of symptomatic severe hypoglycemia = ***   DIABETIC COMPLICATIONS: Microvascular complications:   Retinopathy  Denies: CKD  Last eye exam: Completed   Macrovascular complications:   PVD, CAD (S/P CABG)  Denies: CVA   PAST HISTORY: Past Medical History:  Past Medical History:  Diagnosis Date  . Abscess of great toe, right   . Acute on chronic diastolic CHF (congestive heart failure) ([place]) 06/24/2018  . Acute osteomyelitis of toe, left ([place]) 09/05/2016  . Amputation of right great toe ([place])  12/22/2018  . Anxiety   . Cataract    left - surgery to remove  . Cellulitis of foot, right 12/11/2018  . COMMON MIGRAINE 10/07/2010  . Coronary artery disease   . Decreased visual acuity 11/10/2016  . Depression 12/18/2012  . Diabetes mellitus type II, uncontrolled ([place]) 10/07/2010   Qualifier: Diagnosis of  By: Charlett Blake MD, [name]    . Diabetic foot infection ([facility]) 08/26/2016  . Diabetic infection of right foot ([place])   . Disturbance of skin sensation 10/07/2010  . Gangrene of right foot ([place])   . GERD (gastroesophageal reflux disease)   . Heart murmur   . History of kidney stones    "years ago" - passed stones  . Hyperlipidemia 12/06/2010  . Hypertension   . Hypothyroidism   . Lipoma of abdominal wall 10/05/2016  . Overweight(278.02) 12/06/2010  . PERIPHERAL NEUROPATHY, FEET 10/07/2010   11/17/2019  . PVD (peripheral vascular disease) ([place]) 01/21/2012  . RESTLESS LEG SYNDROME 10/25/2010  . Stroke [facility]) 2014, 2017   most recently in 2/17 - intracerebral hemorrhage    Past Surgical History:  Past Surgical History:  Procedure Laterality Date  . AMPUTATION Right 12/13/2018   Procedure: AMPUTATION RIGHT GREAT TOE, LOCAL RELOCATION OF TISSUE FOR WOUND CLOSURE 9cm x 3cm, VAC APPLICATION;  Surgeon: [name], MD;  Location: [place] [facility];  Service: Orthopedics;  Laterality: Right;  .  AMPUTATION Right 12/31/2018   Procedure: RIGHT BELOW KNEE AMPUTATION;  Surgeon: [name], MD;  Location: [place];  Service: Orthopedics;  Laterality: Right;  . AMPUTATION Left 09/21/2019   Procedure: LEFT BELOW KNEE AMPUTATION;  Surgeon: [name], MD;  Location: [place];  Service: Orthopedics;  Laterality: Left;  . AMPUTATION TOE Left 09/09/2016   Procedure: AMPUTATION OF LEFT GREAT TOE;  Surgeon: Milly Jakob, MD;  Location: [place];  Service: Orthopedics;  Laterality: Left;  . BELOW KNEE LEG AMPUTATION Left 09/21/2019  . CARDIAC CATHETERIZATION  06/24/2018  . CORONARY ARTERY BYPASS GRAFT N/A  06/28/2018   Procedure: CORONARY ARTERY BYPASS GRAFTING (CABG) times  four, using left internal mammary artery, endoscopically harvested right saphenous vein, and harvested left radial artery;  Surgeon: [name], MD;  Location: [place];  Service: Open Heart Surgery;  Laterality: N/A;  . ENDOVEIN HARVEST OF GREATER SAPHENOUS VEIN Right 06/28/2018   Procedure: ENDOVEIN HARVEST OF GREATER SAPHENOUS VEIN;  Surgeon: [name], MD;  Location: [place];  Service: Open Heart Surgery;  Laterality: Right;  . EYE SURGERY Left 06/2017   [facility]  . LEFT HEART CATH AND CORONARY ANGIOGRAPHY N/A 06/24/2018   Procedure: LEFT HEART CATH AND CORONARY ANGIOGRAPHY;  Surgeon: Troy Sine, MD;  Location: [place] [facility];  Service: Cardiovascular;  Laterality: N/A;  . RADIAL ARTERY HARVEST Left 06/28/2018   Procedure: RADIAL ARTERY HARVEST;  Surgeon: [name], MD;  Location: [place];  Service: Open Heart Surgery;  Laterality: Left;  . STUMP REVISION Left 11/18/2019   Procedure: REVISION LEFT BELOW KNEE AMPUTATION;  Surgeon: [name], MD;  Location: [place];  Service: Orthopedics;  Laterality: Left;  . TEE WITHOUT CARDIOVERSION N/A 06/28/2018   Procedure: TRANSESOPHAGEAL ECHOCARDIOGRAM (TEE);  Surgeon: [name], MD;  Location: [place];  Service: Open Heart Surgery;  Laterality: N/A;  . WISDOM TOOTH EXTRACTION        Social History:  reports that she quit smoking about 14 years ago. She smoked 0.50 packs per day. She has never used smokeless tobacco. She reports previous drug use. She reports that she does not drink alcohol. Family History:  Family History  Problem Relation Age of Onset  . Arthritis Mother   . Stroke Brother        previous smoker  . Alcohol abuse Brother        in remission  . Leukemia Brother   . Diabetes Paternal Grandmother   . Healthy Son       HOME MEDICATIONS: Allergies as of 01/13/2020      Reactions   Lyrica [pregabalin] Other (See  Comments)   MADE PATIENT VERY EMOTIONAL AND WOULD CRY EASILY   Morphine And Related Nausea And Vomiting   Penicillins Other (See Comments)   "Made patient feel sick" Did it involve swelling of the face/tongue/throat, SOB, or low BP? No Did it involve sudden or severe rash/hives, skin peeling, or any reaction on the inside of your mouth or nose? No Did you need to seek medical attention at a hospital or doctor's office? No When did it last happen?April 2020 If all above answers are "NO", may proceed with cephalosporin use.   Tramadol Nausea And Vomiting   Pt cant tolerate this pain med.       Medication List       Accurate as of Jan 13, 2020  7:14 AM. If you have any questions, ask your nurse or  doctor.        acetaminophen 325 MG tablet Commonly known as: TYLENOL Take 2 tablets (650 mg total) by mouth every 6 (six) hours as needed for mild pain or headache.   amLODipine 10 MG tablet Commonly known as: NORVASC Take 1 tablet (10 mg total) by mouth daily.   atorvastatin 80 MG tablet Commonly known as: LIPITOR Take 1 tablet (80 mg total) by mouth daily at 6 PM.   azelastine 0.1 % nasal spray Commonly known as: Astelin Place 2 sprays into both nostrils 2 (two) times daily. Use in each nostril as directed   blood glucose meter kit and supplies Dispense based on patient and insurance preference. Use up to four times daily as directed. DX E11.59   docusate sodium 100 MG capsule Commonly known as: COLACE Take 1 capsule (100 mg total) by mouth 2 (two) times daily.   doxycycline 100 MG tablet Commonly known as: VIBRA-TABS Take 1 tablet (100 mg total) by mouth 2 (two) times daily.   escitalopram 10 MG tablet Commonly known as: LEXAPRO Take 1 tablet (10 mg total) by mouth daily.   ferrous sulfate 325 (65 FE) MG EC tablet Take 1 tablet (325 mg total) by mouth daily with breakfast.   insulin glargine 100 UNIT/ML Solostar Pen Commonly known as: LANTUS Inject 10 Units  into the skin at bedtime.   insulin lispro 100 UNIT/ML injection Commonly known as: HumaLOG Inject 0.02 mLs (2 Units total) into the skin 3 (three) times daily with meals. Sliding scale given  And sample given   levothyroxine 25 MCG tablet Commonly known as: SYNTHROID TAKE 1 TABLET (25 MCG TOTAL) BY MOUTH DAILY BEFORE BREAKFAST. What changed:   how much to take  how to take this  when to take this  additional instructions   losartan 25 MG tablet Commonly known as: Cozaar Take 1 tablet (25 mg total) by mouth daily. Dc hydralazine rx just sent.   metFORMIN 1000 MG tablet Commonly known as: GLUCOPHAGE Take 1 tablet (1,000 mg total) by mouth 2 (two) times daily with a meal.   methocarbamol 500 MG tablet Commonly known as: ROBAXIN Take 1 tablet (500 mg total) by mouth every 6 (six) hours as needed for muscle spasms.   metoCLOPramide 5 MG tablet Commonly known as: REGLAN Take 1 tablet (5 mg total) by mouth 4 (four) times daily -  before meals and at bedtime.   multivitamin with minerals Tabs tablet Take 1 tablet by mouth daily.   NovoLOG FlexPen 100 UNIT/ML FlexPen Generic drug: insulin aspart Use as directed with sliding scale. (max dose 30 units per day)   oxyCODONE 5 MG immediate release tablet Commonly known as: Oxy IR/ROXICODONE Take 1 tablet (5 mg total) by mouth every 4 (four) hours as needed for moderate pain (pain score 4-6).   oxyCODONE-acetaminophen 5-325 MG tablet Commonly known as: Percocet Take 1 tablet by mouth every 8 (eight) hours as needed for severe pain.   pantoprazole 40 MG tablet Commonly known as: PROTONIX Take 1 tablet (40 mg total) by mouth daily.   PEN NEEDLES 31GX5/16" 31G X 8 MM Misc Use as directed with Lantus and Novolog pen   polyethylene glycol 17 g packet Commonly known as: MIRALAX / GLYCOLAX Take 17 g by mouth daily.        ALLERGIES: Allergies  Allergen Reactions  . Lyrica [Pregabalin] Other (See Comments)    MADE  PATIENT VERY EMOTIONAL AND WOULD CRY EASILY  . Morphine And Related Nausea  And Vomiting  . Penicillins Other (See Comments)    "Made patient feel sick" Did it involve swelling of the face/tongue/throat, SOB, or low BP? No Did it involve sudden or severe rash/hives, skin peeling, or any reaction on the inside of your mouth or nose? No Did you need to seek medical attention at a hospital or doctor's office? No When did it last happen?April 2020 If all above answers are "NO", may proceed with cephalosporin use.   . Tramadol Nausea And Vomiting    Pt cant tolerate this pain med.      REVIEW OF SYSTEMS: A comprehensive ROS was conducted with the patient and is negative except as per HPI and below:  ROS    OBJECTIVE:   VITAL SIGNS: LMP 02/12/2011 (Exact Date)    PHYSICAL EXAM:  General: Pt appears well and is in NAD  Hydration: Well-hydrated with moist mucous membranes and good skin turgor  HEENT: Head: Unremarkable with good dentition. Oropharynx clear without exudate.  Eyes: External eye exam normal without stare, lid lag or exophthalmos.  EOM intact.  PERRL.  Neck: General: Supple without adenopathy or carotid bruits. Thyroid: Thyroid size normal.  No goiter or nodules appreciated. No thyroid bruit.  Lungs: Clear with good BS bilat with no rales, rhonchi, or wheezes  Heart: RRR with normal S1 and S2 and no gallops; no murmurs; no rub  Abdomen: Normoactive bowel sounds, soft, nontender, without masses or organomegaly palpable  Extremities:  Lower extremities - No pretibial edema. No lesions.  Skin: Normal texture and temperature to palpation. No rash noted. No Acanthosis nigricans/skin tags. No lipohypertrophy.  Neuro: MS is good with appropriate affect, pt is alert and Ox3    DM foot exam:    DATA REVIEWED:  Lab Results  Component Value Date   HGBA1C 13.0 (H) 11/25/2019   HGBA1C 14.1 (H) 09/21/2019   HGBA1C 10.5 (H) 12/12/2018   Lab Results  Component Value Date    MICROALBUR 174.3 (H) 08/14/2017   LDLCALC 159 (H) 11/25/2019   CREATININE 0.89 11/25/2019   Lab Results  Component Value Date   MICRALBCREAT 136.3 (H) 08/14/2017    Lab Results  Component Value Date   CHOL 237 (H) 11/25/2019   HDL 47.40 11/25/2019   LDLCALC 159 (H) 11/25/2019   LDLDIRECT 133.0 03/02/2017   TRIG 150.0 (H) 11/25/2019   CHOLHDL 5 11/25/2019        ASSESSMENT / PLAN / RECOMMENDATIONS:   1) Type *** Diabetes Mellitus, ***controlled, With*** complications - Most recent A1c of *** %. Goal A1c < *** %.  ***  Plan: GENERAL:  ***  MEDICATIONS:  ***  EDUCATION / INSTRUCTIONS:  BG monitoring instructions: Patient is instructed to check her blood sugars *** times a day, ***.  Call [place] [facility] if: BG persistently < 70 or > 300. . I reviewed the Rule of 15 for the treatment of hypoglycemia in detail with the patient. Literature supplied.   2) Diabetic complications:   Eye: Does *** have known diabetic retinopathy.   Neuro/ Feet: Does *** have known diabetic peripheral neuropathy.  Renal: Patient does *** have known baseline CKD. She is *** on an ACEI/ARB at present.Check urine albumin/creatinine ratio yearly starting at time of diagnosis. If albuminuria is positive, treatment is geared toward better glucose, blood pressure control and use of ACE inhibitors or ARBs. Monitor electrolytes and creatinine once to twice yearly.   3) Dyslipidemia: Patient is on Atorvastatin , LDL above goal  4) Hypertension: ***  at goal of < 140/90 mmHg.       Signed electronically by: Mack Guise, MD  [facility] Endocrinology  [facility] [address] [place], [place] [address] Phone: [phone] FAX: [phone]   CC: Mosie Lukes, [address] [place] [address] Phone: [phone]  Fax: [phone]    Return to Endocrinology clinic as below: Future Appointments  Date Time  Provider [place]  01/13/2020 10:30 AM Clarke Amburn, Melanie Crazier, MD [facility] None  01/23/2020 10:30 AM Persons, Bevely Palmer, [place] [facility] None  01/23/2020  2:40 PM Mosie Lukes, MD [facility]

## 2020-01-19 DIAGNOSIS — G546 Phantom limb syndrome with pain: Secondary | ICD-10-CM | POA: Diagnosis not present

## 2020-01-19 DIAGNOSIS — E11319 Type 2 diabetes mellitus with unspecified diabetic retinopathy without macular edema: Secondary | ICD-10-CM | POA: Diagnosis not present

## 2020-01-19 DIAGNOSIS — M4726 Other spondylosis with radiculopathy, lumbar region: Secondary | ICD-10-CM | POA: Diagnosis not present

## 2020-01-19 DIAGNOSIS — I5032 Chronic diastolic (congestive) heart failure: Secondary | ICD-10-CM | POA: Diagnosis not present

## 2020-01-19 DIAGNOSIS — M48062 Spinal stenosis, lumbar region with neurogenic claudication: Secondary | ICD-10-CM | POA: Diagnosis not present

## 2020-01-19 DIAGNOSIS — I2510 Atherosclerotic heart disease of native coronary artery without angina pectoris: Secondary | ICD-10-CM | POA: Diagnosis not present

## 2020-01-19 DIAGNOSIS — E1122 Type 2 diabetes mellitus with diabetic chronic kidney disease: Secondary | ICD-10-CM | POA: Diagnosis not present

## 2020-01-19 DIAGNOSIS — I130 Hypertensive heart and chronic kidney disease with heart failure and stage 1 through stage 4 chronic kidney disease, or unspecified chronic kidney disease: Secondary | ICD-10-CM | POA: Diagnosis not present

## 2020-01-19 DIAGNOSIS — E785 Hyperlipidemia, unspecified: Secondary | ICD-10-CM | POA: Diagnosis not present

## 2020-01-19 DIAGNOSIS — F329 Major depressive disorder, single episode, unspecified: Secondary | ICD-10-CM | POA: Diagnosis not present

## 2020-01-19 DIAGNOSIS — E039 Hypothyroidism, unspecified: Secondary | ICD-10-CM | POA: Diagnosis not present

## 2020-01-19 DIAGNOSIS — Z4781 Encounter for orthopedic aftercare following surgical amputation: Secondary | ICD-10-CM | POA: Diagnosis not present

## 2020-01-19 DIAGNOSIS — E1151 Type 2 diabetes mellitus with diabetic peripheral angiopathy without gangrene: Secondary | ICD-10-CM | POA: Diagnosis not present

## 2020-01-19 DIAGNOSIS — N183 Chronic kidney disease, stage 3 unspecified: Secondary | ICD-10-CM | POA: Diagnosis not present

## 2020-01-19 DIAGNOSIS — D631 Anemia in chronic kidney disease: Secondary | ICD-10-CM | POA: Diagnosis not present

## 2020-01-19 DIAGNOSIS — E1142 Type 2 diabetes mellitus with diabetic polyneuropathy: Secondary | ICD-10-CM | POA: Diagnosis not present

## 2020-01-21 ENCOUNTER — Other Ambulatory Visit: Payer: Self-pay | Admitting: Medical

## 2020-01-21 DIAGNOSIS — I10 Essential (primary) hypertension: Secondary | ICD-10-CM

## 2020-01-23 ENCOUNTER — Other Ambulatory Visit: Payer: Self-pay

## 2020-01-23 ENCOUNTER — Ambulatory Visit: Payer: BC Managed Care – PPO | Admitting: Physician Assistant

## 2020-01-23 ENCOUNTER — Encounter: Payer: Self-pay | Admitting: Family Medicine

## 2020-01-23 ENCOUNTER — Ambulatory Visit (INDEPENDENT_AMBULATORY_CARE_PROVIDER_SITE_OTHER): Payer: BC Managed Care – PPO | Admitting: Family Medicine

## 2020-01-23 DIAGNOSIS — E039 Hypothyroidism, unspecified: Secondary | ICD-10-CM

## 2020-01-23 DIAGNOSIS — H6981 Other specified disorders of Eustachian tube, right ear: Secondary | ICD-10-CM | POA: Diagnosis not present

## 2020-01-23 DIAGNOSIS — I25119 Atherosclerotic heart disease of native coronary artery with unspecified angina pectoris: Secondary | ICD-10-CM

## 2020-01-23 DIAGNOSIS — E43 Unspecified severe protein-calorie malnutrition: Secondary | ICD-10-CM

## 2020-01-23 DIAGNOSIS — L578 Other skin changes due to chronic exposure to nonionizing radiation: Secondary | ICD-10-CM | POA: Diagnosis not present

## 2020-01-23 DIAGNOSIS — I10 Essential (primary) hypertension: Secondary | ICD-10-CM

## 2020-01-23 DIAGNOSIS — G610 Guillain-Barre syndrome: Secondary | ICD-10-CM

## 2020-01-23 DIAGNOSIS — E1165 Type 2 diabetes mellitus with hyperglycemia: Secondary | ICD-10-CM

## 2020-01-23 DIAGNOSIS — L89620 Pressure ulcer of left heel, unstageable: Secondary | ICD-10-CM | POA: Diagnosis not present

## 2020-01-23 DIAGNOSIS — E785 Hyperlipidemia, unspecified: Secondary | ICD-10-CM

## 2020-01-23 DIAGNOSIS — E1159 Type 2 diabetes mellitus with other circulatory complications: Secondary | ICD-10-CM

## 2020-01-23 DIAGNOSIS — I739 Peripheral vascular disease, unspecified: Secondary | ICD-10-CM

## 2020-01-23 DIAGNOSIS — I5032 Chronic diastolic (congestive) heart failure: Secondary | ICD-10-CM

## 2020-01-23 MED ORDER — INSULIN LISPRO 100 UNIT/ML ~~LOC~~ SOLN
4.0000 [IU] | Freq: Three times a day (TID) | SUBCUTANEOUS | 3 refills | Status: DC
Start: 2020-01-23 — End: 2020-02-03

## 2020-01-23 MED ORDER — AZELASTINE HCL 0.1 % NA SOLN: 2.0000 | Freq: Two times a day (BID) | NASAL | 3 refills | Status: DC

## 2020-01-23 MED ORDER — INSULIN GLARGINE 100 UNIT/ML SOLOSTAR PEN: PEN_INJECTOR | SUBCUTANEOUS | 3 refills | Status: DC

## 2020-01-23 NOTE — Assessment & Plan Note (Signed)
Monitor and report any concerns, no changes to meds. Encouraged heart healthy diet such as the DASH diet and exercise as tolerated.  ?

## 2020-01-23 NOTE — Assessment & Plan Note (Signed)
Tolerating statin, encouraged heart healthy diet, avoid trans fats, minimize simple carbs and saturated fats. Increase exercise as tolerated 

## 2020-01-23 NOTE — Progress Notes (Signed)
Virtual Visit via Video Note  I connected with [name] on 01/23/20 at  2:40 PM EDT by a video enabled telemedicine application and verified that I am speaking with the correct person using two identifiers.  Location: Patient: home patient was in visit with her home health aide Provider: office, provider was in visit   I discussed the limitations of evaluation and management by telemedicine and the availability of in person appointments. The patient expressed understanding and agreed to proceed. Kem Boroughs, CMA was able to get the patient set up on a video visit   Subjective:    [name], female    DOB: May 03, 1966, 54 y.o.   MRN: [id number]  Chief Complaint  Patient presents with  . spot on right wrist thinks may be cancer    HPI Patient is in today for follow up on chronic medical concerns. Their greatest concern is her high blood sugars. Despite 2 units of humalog tid and lantus 20 units qhs as well as an improved diet with increased protein and less carbs she is still noting all sugars above 300. She is noting fatigue and myalgias as well. Denies CP/palp/SOB/HA/congestion/fevers/GI or GU c/o. Taking meds as prescribed. They are noting two new skin lesions on her nose and arm they feel should get looked at.  Past Medical History:  Diagnosis Date  . Abscess of great toe, right   . Acute on chronic diastolic CHF (congestive heart failure) ([place]) 06/24/2018  . Acute osteomyelitis of toe, left ([place]) 09/05/2016  . Amputation of right great toe ([place]) 12/22/2018  . Anxiety   . Cataract    left - surgery to remove  . Cellulitis of foot, right 12/11/2018  . COMMON MIGRAINE 10/07/2010  . Coronary artery disease   . Decreased visual acuity 11/10/2016  . Depression 12/18/2012  . Diabetes mellitus type II, uncontrolled ([place]) 10/07/2010   Qualifier: Diagnosis of  By: Charlett Blake MD, [name]    . Diabetic foot infection ([place]) 08/26/2016  . Diabetic infection of right  foot ([place])   . Disturbance of skin sensation 10/07/2010  . Gangrene of right foot ([place])   . GERD (gastroesophageal reflux disease)   . Heart murmur   . History of kidney stones    "years ago" - passed stones  . Hyperlipidemia 12/06/2010  . Hypertension   . Hypothyroidism   . Lipoma of abdominal wall 10/05/2016  . Overweight(278.02) 12/06/2010  . PERIPHERAL NEUROPATHY, FEET 10/07/2010   11/17/2019  . PVD (peripheral vascular disease) ([place]) 01/21/2012  . RESTLESS LEG SYNDROME 10/25/2010  . Stroke [facility]) 2014, 2017   most recently in 2/17 - intracerebral hemorrhage    Past Surgical History:  Procedure Laterality Date  . AMPUTATION Right 12/13/2018   Procedure: AMPUTATION RIGHT GREAT TOE, LOCAL RELOCATION OF TISSUE FOR WOUND CLOSURE 9cm x 3cm, VAC APPLICATION;  Surgeon: [name], MD;  Location: [place];  Service: Orthopedics;  Laterality: Right;  . AMPUTATION Right 12/31/2018   Procedure: RIGHT BELOW KNEE AMPUTATION;  Surgeon: [name], MD;  Location: [place];  Service: Orthopedics;  Laterality: Right;  . AMPUTATION Left 09/21/2019   Procedure: LEFT BELOW KNEE AMPUTATION;  Surgeon: [name], MD;  Location: [place];  Service: Orthopedics;  Laterality: Left;  . AMPUTATION TOE Left 09/09/2016   Procedure: AMPUTATION OF LEFT GREAT TOE;  Surgeon: Milly Jakob, MD;  Location: [place];  Service: Orthopedics;  Laterality: Left;  . BELOW KNEE LEG AMPUTATION Left  09/21/2019  . CARDIAC CATHETERIZATION  06/24/2018  . CORONARY ARTERY BYPASS GRAFT N/A 06/28/2018   Procedure: CORONARY ARTERY BYPASS GRAFTING (CABG) times  four, using left internal mammary artery, endoscopically harvested right saphenous vein, and harvested left radial artery;  Surgeon: [name], MD;  Location: [place];  Service: Open Heart Surgery;  Laterality: N/A;  . ENDOVEIN HARVEST OF GREATER SAPHENOUS VEIN Right 06/28/2018   Procedure: ENDOVEIN HARVEST OF GREATER SAPHENOUS VEIN;  Surgeon: [name], MD;  Location: [place];  Service: Open Heart Surgery;  Laterality: Right;  . EYE SURGERY Left 06/2017   [facility]  . LEFT HEART CATH AND CORONARY ANGIOGRAPHY N/A 06/24/2018   Procedure: LEFT HEART CATH AND CORONARY ANGIOGRAPHY;  Surgeon: Troy Sine, MD;  Location: [place] [facility];  Service: Cardiovascular;  Laterality: N/A;  . RADIAL ARTERY HARVEST Left 06/28/2018   Procedure: RADIAL ARTERY HARVEST;  Surgeon: [name], MD;  Location: [place];  Service: Open Heart Surgery;  Laterality: Left;  . STUMP REVISION Left 11/18/2019   Procedure: REVISION LEFT BELOW KNEE AMPUTATION;  Surgeon: [name], MD;  Location: [place];  Service: Orthopedics;  Laterality: Left;  . TEE WITHOUT CARDIOVERSION N/A 06/28/2018   Procedure: TRANSESOPHAGEAL ECHOCARDIOGRAM (TEE);  Surgeon: [name], MD;  Location: [place];  Service: Open Heart Surgery;  Laterality: N/A;  . WISDOM TOOTH EXTRACTION      Family History  Problem Relation Age of Onset  . Arthritis Mother   . Stroke Brother        previous smoker  . Alcohol abuse Brother        in remission  . Leukemia Brother   . Diabetes Paternal Grandmother   . Healthy Son     Social History   Socioeconomic History  . Marital status: Single    Spouse name: Not on file  . Number of children: Not on file  . Years of education: Not on file  . Highest education level: Not on file  Occupational History  . Occupation: Glass blower/designer  Tobacco Use  . Smoking status: Former Smoker    Packs/day: 0.50    Quit date: 09/08/2005    Years since quitting: 14.3  . Smokeless tobacco: Never Used  . Tobacco comment: " 1 smoke a week in the past"  Substance and Sexual Activity  . Alcohol use: No    Alcohol/week: 0.0 standard drinks  . Drug use: Not Currently  . Sexual activity: Yes    Partners: Male    Birth control/protection: Post-menopausal  Other Topics Concern  . Not on file  Social History Narrative   Lives with husband in a  two story home.  Has one child.     Works as a Glass blower/designer.     Education: high school.   Social Determinants of Health   Financial Resource Strain:   . Difficulty of Paying Living Expenses:   Food Insecurity:   . Worried About Charity fundraiser in the Last Year:   . Arboriculturist in the Last Year:   Transportation Needs:   . Film/video editor (Medical):   Marland Kitchen Lack of Transportation (Non-Medical):   Physical Activity:   . Days of Exercise per Week:   . Minutes of Exercise per Session:   Stress:   . Feeling of Stress :   Social Connections:   . Frequency of Communication with Friends and Family:   . Frequency of Social Gatherings with  Friends and Family:   . Attends Religious Services:   . Active Member of Clubs or Organizations:   . Attends Archivist Meetings:   Marland Kitchen Marital Status:   Intimate Partner Violence:   . Fear of Current or Ex-Partner:   . Emotionally Abused:   Marland Kitchen Physically Abused:   . Sexually Abused:     Outpatient Medications Prior to Visit  Medication Sig Dispense Refill  . acetaminophen (TYLENOL) 325 MG tablet Take 2 tablets (650 mg total) by mouth every 6 (six) hours as needed for mild pain or headache.    Marland Kitchen amLODipine (NORVASC) 10 MG tablet TAKE 1 TABLET BY MOUTH EVERY DAY 30 tablet 1  . atorvastatin (LIPITOR) 80 MG tablet Take 1 tablet (80 mg total) by mouth daily at 6 PM. 30 tablet 1  . blood glucose meter kit and supplies Dispense based on patient and insurance preference. Use up to four times daily as directed. DX E11.59 1 each 11  . docusate sodium (COLACE) 100 MG capsule Take 1 capsule (100 mg total) by mouth 2 (two) times daily. 10 capsule 0  . doxycycline (VIBRA-TABS) 100 MG tablet Take 1 tablet (100 mg total) by mouth 2 (two) times daily. 20 tablet 0  . escitalopram (LEXAPRO) 10 MG tablet Take 1 tablet (10 mg total) by mouth daily. 90 tablet 1  . ferrous sulfate 325 (65 FE) MG EC tablet Take 1 tablet (325 mg total) by mouth daily  with breakfast. 30 tablet 3  . insulin aspart (NOVOLOG FLEXPEN) 100 UNIT/ML FlexPen Use as directed with sliding scale. (max dose 30 units per day) 15 mL 5  . insulin lispro (HUMALOG) 100 UNIT/ML injection Inject 0.02 mLs (2 Units total) into the skin 3 (three) times daily with meals. Sliding scale given  And sample given 10 mL 11  . Insulin Pen Needle (PEN NEEDLES 31GX5/16") 31G X 8 MM MISC Use as directed with Lantus and Novolog pen 100 each 5  . levothyroxine (SYNTHROID) 25 MCG tablet TAKE 1 TABLET (25 MCG TOTAL) BY MOUTH DAILY BEFORE BREAKFAST. (Patient taking differently: Take 25 mcg by mouth daily before breakfast. ) 90 tablet 1  . losartan (COZAAR) 25 MG tablet Take 1 tablet (25 mg total) by mouth daily. Dc hydralazine rx just sent. 30 tablet 3  . metFORMIN (GLUCOPHAGE) 1000 MG tablet Take 1 tablet (1,000 mg total) by mouth 2 (two) times daily with a meal. 180 tablet 3  . methocarbamol (ROBAXIN) 500 MG tablet Take 1 tablet (500 mg total) by mouth every 6 (six) hours as needed for muscle spasms. 60 tablet 0  . metoCLOPramide (REGLAN) 5 MG tablet Take 1 tablet (5 mg total) by mouth 4 (four) times daily -  before meals and at bedtime. 90 tablet 0  . Multiple Vitamin (MULTIVITAMIN WITH MINERALS) TABS tablet Take 1 tablet by mouth daily.    Marland Kitchen oxyCODONE (OXY IR/ROXICODONE) 5 MG immediate release tablet Take 1 tablet (5 mg total) by mouth every 4 (four) hours as needed for moderate pain (pain score 4-6). 30 tablet 0  . oxyCODONE-acetaminophen (PERCOCET) 5-325 MG tablet Take 1 tablet by mouth every 8 (eight) hours as needed for severe pain. 20 tablet 0  . pantoprazole (PROTONIX) 40 MG tablet Take 1 tablet (40 mg total) by mouth daily. 30 tablet 0  . polyethylene glycol (MIRALAX / GLYCOLAX) 17 g packet Take 17 g by mouth daily. 14 each 0  . azelastine (ASTELIN) 0.1 % nasal spray Place 2 sprays into  both nostrils 2 (two) times daily. Use in each nostril as directed 30 mL 3  . insulin glargine (LANTUS)  100 UNIT/ML Solostar Pen Inject 10 Units into the skin at bedtime. 15 mL 11   No facility-administered medications prior to visit.    Allergies  Allergen Reactions  . Lyrica [Pregabalin] Other (See Comments)    MADE PATIENT VERY EMOTIONAL AND WOULD CRY EASILY  . Morphine And Related Nausea And Vomiting  . Penicillins Other (See Comments)    "Made patient feel sick" Did it involve swelling of the face/tongue/throat, SOB, or low BP? No Did it involve sudden or severe rash/hives, skin peeling, or any reaction on the inside of your mouth or nose? No Did you need to seek medical attention at a hospital or doctor's office? No When did it last happen?April 2020 If all above answers are "NO", may proceed with cephalosporin use.   . Tramadol Nausea And Vomiting    Pt cant tolerate this pain med.     Review of Systems  Constitutional: Positive for malaise/fatigue. Negative for fever.  HENT: Negative for congestion.   Eyes: Negative for pain.  Respiratory: Positive for shortness of breath.   Cardiovascular: Negative for chest pain.  Gastrointestinal: Negative for abdominal pain and nausea.  Genitourinary: Negative for dysuria.  Musculoskeletal: Positive for myalgias.  Skin: Negative for rash.  Neurological: Negative for dizziness and loss of consciousness.  Endo/Heme/Allergies: Negative for environmental allergies.  Psychiatric/Behavioral: Positive for depression. The patient is nervous/anxious.        Objective:    Physical Exam Constitutional:      Appearance: Normal appearance. She is not ill-appearing.  HENT:     Head: Normocephalic and atraumatic.     Right Ear: External ear normal.     Left Ear: External ear normal.     Nose: Nose normal.  Neurological:     Mental Status: She is alert and oriented to person, place, and time.  Psychiatric:        Behavior: Behavior normal.     LMP 02/12/2011 (Exact Date)  Wt Readings from Last 3 Encounters:  01/04/20 140 lb  (63.5 kg)  12/20/19 140 lb (63.5 kg)  12/15/19 140 lb (63.5 kg)    Diabetic Foot Exam - Simple   No data filed     Lab Results  Component Value Date   WBC 9.5 12/30/2019   HGB 11.5 (L) 12/30/2019   HCT 35.2 (L) 12/30/2019   PLT 496.0 (H) 12/30/2019   GLUCOSE 198 (H) 11/25/2019   CHOL 237 (H) 11/25/2019   TRIG 150.0 (H) 11/25/2019   HDL 47.40 11/25/2019   LDLDIRECT 133.0 03/02/2017   LDLCALC 159 (H) 11/25/2019   ALT 11 11/25/2019   AST 14 11/25/2019   NA 134 (L) 11/25/2019   K 4.3 11/25/2019   CL 96 11/25/2019   CREATININE 0.89 11/25/2019   BUN 17 11/25/2019   CO2 31 11/25/2019   TSH 3.396 07/02/2018   INR 1.30 06/28/2018   HGBA1C 13.0 (H) 11/25/2019   MICROALBUR 174.3 (H) 08/14/2017    Lab Results  Component Value Date   TSH 3.396 07/02/2018   Lab Results  Component Value Date   WBC 9.5 12/30/2019   HGB 11.5 (L) 12/30/2019   HCT 35.2 (L) 12/30/2019   MCV 85.1 12/30/2019   PLT 496.0 (H) 12/30/2019   Lab Results  Component Value Date   NA 134 (L) 11/25/2019   K 4.3 11/25/2019   CO2 31 11/25/2019  GLUCOSE 198 (H) 11/25/2019   BUN 17 11/25/2019   CREATININE 0.89 11/25/2019   BILITOT 0.3 11/25/2019   ALKPHOS 119 (H) 11/25/2019   AST 14 11/25/2019   ALT 11 11/25/2019   PROT 6.1 11/25/2019   ALBUMIN 2.8 (L) 11/25/2019   CALCIUM 8.6 11/25/2019   ANIONGAP 12 10/11/2019   GFR 65.62 11/25/2019   Lab Results  Component Value Date   CHOL 237 (H) 11/25/2019   Lab Results  Component Value Date   HDL 47.40 11/25/2019   Lab Results  Component Value Date   LDLCALC 159 (H) 11/25/2019   Lab Results  Component Value Date   TRIG 150.0 (H) 11/25/2019   Lab Results  Component Value Date   CHOLHDL 5 11/25/2019   Lab Results  Component Value Date   HGBA1C 13.0 (H) 11/25/2019       Assessment & Plan:   Problem List Items Addressed This Visit    Severe protein-calorie malnutrition ([place]) (Chronic)   Essential hypertension, benign    Monitor and  report any concerns, no changes to meds. Encouraged heart healthy diet such as the DASH diet and exercise as tolerated.       Hyperlipidemia    Tolerating statin, encouraged heart healthy diet, avoid trans fats, minimize simple carbs and saturated fats. Increase exercise as tolerated      Type 2 diabetes mellitus with vascular disease ([place])    Still poorly controlled. Lantus is up to 20 units and her sugars are consistently over 300. She has an aide with her now so she is eating better and more consistently. Increase Humalog from 2 units tid to 4 units did and increase Lantus by 2 units every 3 days as long as they do not see any numbers below 150. She has an appointment soon with endocrinology. Now that she is not working excessive hours and has family support and a consistent aide she has her best shot in years of getting her sugars improved. They must make sure she is eating minimal carbs and increased proteins with scheduled meals.       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 Is referred to dermatology for consideration      Relevant Orders   Ambulatory referral to Dermatology    Other Visit Diagnoses    Dysfunction of right eustachian tube       Relevant Medications   azelastine (ASTELIN) 0.1 % nasal spray   Chronic diastolic (congestive) heart failure (HCC)   (Chronic)     Peripheral vascular disease, unspecified (HCC)    (Chronic)     Atherosclerosis of native coronary artery of native heart with angina pectoris ([place])   (Chronic)        I have changed [place] Breeze. [place]madge's insulin glargine. I am also having her start on insulin lispro. Additionally, I am having her maintain her acetaminophen, docusate sodium, levothyroxine, metoCLOPramide, multivitamin with minerals, atorvastatin, methocarbamol,  polyethylene glycol, pantoprazole, oxyCODONE, escitalopram, metFORMIN, blood glucose meter kit and supplies, PEN NEEDLES 31GX5/16", NovoLOG FlexPen, insulin lispro, oxyCODONE-acetaminophen, doxycycline, losartan, ferrous sulfate, amLODipine, and azelastine.  Meds ordered this encounter  Medications  . azelastine (ASTELIN) 0.1 % nasal spray    Sig: Place 2 sprays into both nostrils 2 (two) times daily. Use in each nostril as directed    Dispense:  30 mL    Refill:  3  . insulin glargine (LANTUS) 100 UNIT/ML Solostar Pen    Sig: 20 units SQ qhs and then increase by 2 units every 3 days if no sugars below 150    Dispense:  15 mL    Refill:  3  . insulin lispro (HUMALOG) 100 UNIT/ML injection    Sig: Inject 0.04 mLs (4 Units total) into the skin 3 (three) times daily before meals.    Dispense:  10 mL    Refill:  3     I discussed the assessment and treatment plan with the patient. The patient was provided an opportunity to ask questions and all were answered. The patient agreed with the plan and demonstrated an understanding of the instructions.   The patient was advised to call back or seek an in-person evaluation if the symptoms worsen or if the condition fails to improve as anticipated.  I provided 20 minutes of non-face-to-face time during this encounter.   Penni Homans, MD

## 2020-01-23 NOTE — Assessment & Plan Note (Signed)
With a concerning lesion on nose and left arm. Is referred to dermatology for consideration

## 2020-01-23 NOTE — Assessment & Plan Note (Signed)
Still poorly controlled. Lantus is up to 20 units and her sugars are consistently over 300. She has an aide with her now so she is eating better and more consistently. Increase Humalog from 2 units tid to 4 units did and increase Lantus by 2 units every 3 days as long as they do not see any numbers below 150. She has an appointment soon with endocrinology. Now that she is not working excessive hours and has family support and a consistent aide she has her best shot in years of getting her sugars improved. They must make sure she is eating minimal carbs and increased proteins with scheduled meals.

## 2020-01-23 NOTE — Assessment & Plan Note (Signed)
On Levothyroxine, continue to monitor 

## 2020-01-25 DIAGNOSIS — I2510 Atherosclerotic heart disease of native coronary artery without angina pectoris: Secondary | ICD-10-CM | POA: Diagnosis not present

## 2020-01-25 DIAGNOSIS — E1151 Type 2 diabetes mellitus with diabetic peripheral angiopathy without gangrene: Secondary | ICD-10-CM | POA: Diagnosis not present

## 2020-01-25 DIAGNOSIS — E785 Hyperlipidemia, unspecified: Secondary | ICD-10-CM | POA: Diagnosis not present

## 2020-01-25 DIAGNOSIS — D631 Anemia in chronic kidney disease: Secondary | ICD-10-CM | POA: Diagnosis not present

## 2020-01-25 DIAGNOSIS — Z4781 Encounter for orthopedic aftercare following surgical amputation: Secondary | ICD-10-CM | POA: Diagnosis not present

## 2020-01-25 DIAGNOSIS — E039 Hypothyroidism, unspecified: Secondary | ICD-10-CM | POA: Diagnosis not present

## 2020-01-25 DIAGNOSIS — M4726 Other spondylosis with radiculopathy, lumbar region: Secondary | ICD-10-CM | POA: Diagnosis not present

## 2020-01-25 DIAGNOSIS — G546 Phantom limb syndrome with pain: Secondary | ICD-10-CM | POA: Diagnosis not present

## 2020-01-25 DIAGNOSIS — E11319 Type 2 diabetes mellitus with unspecified diabetic retinopathy without macular edema: Secondary | ICD-10-CM | POA: Diagnosis not present

## 2020-01-25 DIAGNOSIS — I130 Hypertensive heart and chronic kidney disease with heart failure and stage 1 through stage 4 chronic kidney disease, or unspecified chronic kidney disease: Secondary | ICD-10-CM | POA: Diagnosis not present

## 2020-01-25 DIAGNOSIS — I5032 Chronic diastolic (congestive) heart failure: Secondary | ICD-10-CM | POA: Diagnosis not present

## 2020-01-25 DIAGNOSIS — F329 Major depressive disorder, single episode, unspecified: Secondary | ICD-10-CM | POA: Diagnosis not present

## 2020-01-25 DIAGNOSIS — E1142 Type 2 diabetes mellitus with diabetic polyneuropathy: Secondary | ICD-10-CM | POA: Diagnosis not present

## 2020-01-25 DIAGNOSIS — M48062 Spinal stenosis, lumbar region with neurogenic claudication: Secondary | ICD-10-CM | POA: Diagnosis not present

## 2020-01-25 DIAGNOSIS — E1122 Type 2 diabetes mellitus with diabetic chronic kidney disease: Secondary | ICD-10-CM | POA: Diagnosis not present

## 2020-01-25 DIAGNOSIS — N183 Chronic kidney disease, stage 3 unspecified: Secondary | ICD-10-CM | POA: Diagnosis not present

## 2020-01-26 ENCOUNTER — Ambulatory Visit: Payer: BC Managed Care – PPO | Admitting: Physician Assistant

## 2020-01-30 ENCOUNTER — Ambulatory Visit: Payer: BC Managed Care – PPO | Admitting: Physician Assistant

## 2020-02-01 DIAGNOSIS — I130 Hypertensive heart and chronic kidney disease with heart failure and stage 1 through stage 4 chronic kidney disease, or unspecified chronic kidney disease: Secondary | ICD-10-CM | POA: Diagnosis not present

## 2020-02-01 DIAGNOSIS — G546 Phantom limb syndrome with pain: Secondary | ICD-10-CM | POA: Diagnosis not present

## 2020-02-01 DIAGNOSIS — E1122 Type 2 diabetes mellitus with diabetic chronic kidney disease: Secondary | ICD-10-CM | POA: Diagnosis not present

## 2020-02-01 DIAGNOSIS — E785 Hyperlipidemia, unspecified: Secondary | ICD-10-CM | POA: Diagnosis not present

## 2020-02-01 DIAGNOSIS — I5032 Chronic diastolic (congestive) heart failure: Secondary | ICD-10-CM | POA: Diagnosis not present

## 2020-02-01 DIAGNOSIS — D631 Anemia in chronic kidney disease: Secondary | ICD-10-CM | POA: Diagnosis not present

## 2020-02-01 DIAGNOSIS — N183 Chronic kidney disease, stage 3 unspecified: Secondary | ICD-10-CM | POA: Diagnosis not present

## 2020-02-01 DIAGNOSIS — E039 Hypothyroidism, unspecified: Secondary | ICD-10-CM | POA: Diagnosis not present

## 2020-02-01 DIAGNOSIS — I2510 Atherosclerotic heart disease of native coronary artery without angina pectoris: Secondary | ICD-10-CM | POA: Diagnosis not present

## 2020-02-01 DIAGNOSIS — E11319 Type 2 diabetes mellitus with unspecified diabetic retinopathy without macular edema: Secondary | ICD-10-CM | POA: Diagnosis not present

## 2020-02-01 DIAGNOSIS — F329 Major depressive disorder, single episode, unspecified: Secondary | ICD-10-CM | POA: Diagnosis not present

## 2020-02-01 DIAGNOSIS — E1151 Type 2 diabetes mellitus with diabetic peripheral angiopathy without gangrene: Secondary | ICD-10-CM | POA: Diagnosis not present

## 2020-02-01 DIAGNOSIS — M4726 Other spondylosis with radiculopathy, lumbar region: Secondary | ICD-10-CM | POA: Diagnosis not present

## 2020-02-01 DIAGNOSIS — Z4781 Encounter for orthopedic aftercare following surgical amputation: Secondary | ICD-10-CM | POA: Diagnosis not present

## 2020-02-01 DIAGNOSIS — E1142 Type 2 diabetes mellitus with diabetic polyneuropathy: Secondary | ICD-10-CM | POA: Diagnosis not present

## 2020-02-01 DIAGNOSIS — M48062 Spinal stenosis, lumbar region with neurogenic claudication: Secondary | ICD-10-CM | POA: Diagnosis not present

## 2020-02-02 ENCOUNTER — Telehealth: Payer: Self-pay | Admitting: *Deleted

## 2020-02-02 NOTE — Telephone Encounter (Signed)
[facility] sent a fax stating that pt has been on Novolog and that Humalog is not covered by insurance and is $300. They would like to change to Novolog.

## 2020-02-02 NOTE — Telephone Encounter (Signed)
Yes please change to Novolog with same sig

## 2020-02-03 MED ORDER — NOVOLOG FLEXPEN 100 UNIT/ML ~~LOC~~ SOPN: 4.0000 [IU] | PEN_INJECTOR | Freq: Three times a day (TID) | SUBCUTANEOUS | 5 refills | Status: DC

## 2020-02-03 NOTE — Telephone Encounter (Signed)
Rx sent in for Novolog.  

## 2020-02-07 ENCOUNTER — Other Ambulatory Visit: Payer: Self-pay

## 2020-02-07 ENCOUNTER — Encounter: Payer: Self-pay | Admitting: Family

## 2020-02-07 ENCOUNTER — Ambulatory Visit (INDEPENDENT_AMBULATORY_CARE_PROVIDER_SITE_OTHER): Payer: BC Managed Care – PPO | Admitting: Orthopedic Surgery

## 2020-02-07 DIAGNOSIS — Z89511 Acquired absence of right leg below knee: Secondary | ICD-10-CM

## 2020-02-07 DIAGNOSIS — Z89512 Acquired absence of left leg below knee: Secondary | ICD-10-CM

## 2020-02-07 NOTE — Progress Notes (Signed)
Office Visit Note   Patient: [name]           Date of Birth: 1966/06/19           MRN: [id number] Visit Date: 02/07/2020              Requested by: Mosie Lukes, MD [address] [place],  [place] [address] PCP: Mosie Lukes, MD  No chief complaint on file.     HPI: Patient is a 54 year old woman who is seen status post revision left transtibial amputation she states she has a small area of scab She states she has had some minor bleeding otherwise denies any concerns.  Follow-up  Patient is 10 weeks out from surgery.  Assessment & Plan: Visit Diagnoses:  1. Acquired absence of left leg below knee (HCC)   2. Acquired absence of right leg below knee [facility])     Plan: Patient will follow up with [facility] for prosthetic fitting.  Follow-Up Instructions: Return in about 3 weeks (around 02/28/2020).   Ortho Exam  Patient is alert, oriented, no adenopathy, well-dressed, normal affect, normal respiratory effort. Examination the incision is healing slowly there is no cellulitis no odor no drainage no signs of infection.  Imaging: No results found. No images are attached to the encounter.  Labs: Lab Results  Component Value Date   HGBA1C 13.0 (H) 11/25/2019   HGBA1C 14.1 (H) 09/21/2019   HGBA1C 10.5 (H) 12/12/2018   ESRSEDRATE 130 (H) 12/12/2018   ESRSEDRATE 115 (H) 06/24/2018   ESRSEDRATE 107 (H) 09/04/2016   CRP 28.2 (H) 12/12/2018   CRP 7.2 (H) 09/04/2016   CRP 0.7 03/21/2015   LABURIC 3.2 06/13/2014   LABURIC 3.1 02/09/2014   REPTSTATUS 10/08/2019 FINAL 10/06/2019   GRAMSTAIN  12/13/2018    FEW WBC PRESENT,BOTH PMN AND MONONUCLEAR NO ORGANISMS SEEN    CULT >=100,000 COLONIES/mL CITROBACTER FREUNDII (A) 10/06/2019   LABORGA CITROBACTER FREUNDII (A) 10/06/2019     Lab Results  Component Value Date   ALBUMIN 2.8 (L) 11/25/2019   ALBUMIN 2.2 (L) 10/11/2019   ALBUMIN 2.1 (L) 09/28/2019   PREALBUMIN 12.7 (L) 09/04/2016   LABURIC 3.2  06/13/2014   LABURIC 3.1 02/09/2014    Lab Results  Component Value Date   MG 2.1 12/16/2018   MG 2.8 (H) 06/29/2018   MG 3.0 (H) 06/29/2018   No results found for: VD25OH  Lab Results  Component Value Date   PREALBUMIN 12.7 (L) 09/04/2016   CBC EXTENDED Latest Ref Rng & Units 12/30/2019 11/25/2019 10/11/2019  WBC 4.0 - 10.5 K/uL 9.5 10.2 8.3  RBC 3.87 - 5.11 Mil/uL 4.14 3.66(L) 3.52(L)  HGB 12.0 - 15.0 g/dL 11.5(L) 10.0(L) 9.5(L)  HCT 36.0 - 46.0 % 35.2(L) 30.8(L) 29.8(L)  PLT 150.0 - 400.0 K/uL 496.0(H) 501.0(H) 654(H)  NEUTROABS 1.4 - 7.7 K/uL 6.7 7.7 4.5  LYMPHSABS 0.7 - 4.0 K/uL 1.9 1.6 2.5     There is no height or weight on file to calculate BMI.  Orders:  No orders of the defined types were placed in this encounter.  No orders of the defined types were placed in this encounter.    Procedures: No procedures performed  Clinical Data: No additional findings.  ROS:  All other systems negative, except as noted in the HPI. Review of Systems  Objective: Vital Signs: LMP 02/12/2011 (Exact Date)   Specialty Comments:  No specialty comments available.  PMFS History: Patient Active Problem List  Diagnosis Date Noted  . Polyradiculoneuropathy ([place]) 01/23/2020  . Sun-damaged skin 01/23/2020  . Uncontrolled type 2 diabetes mellitus with hyperglycemia ([place]) 11/25/2019  . Wound infection after surgery 11/25/2019  . Dehiscence of amputation stump ([place])   . Adjustment disorder with mixed anxiety and depressed mood   . S/P bilateral BKA (below knee amputation) ([place]) 09/27/2019  . Acute osteomyelitis of left foot ([place]) 09/21/2019  . Subacute osteomyelitis, left ankle and foot ([place])   . Pressure injury of left heel, unstageable ([place]) 02/09/2019  . Hypoglycemia   . Diabetes mellitus type 2 in nonobese (HCC)   . Labile blood pressure   . Labile blood glucose   . Phantom limb pain ([place])   . CKD (chronic kidney disease), stage III   . Hypothyroidism   . Essential  hypertension   . Chronic diastolic congestive heart failure ([place])   . Coronary artery disease involving coronary bypass graft of native heart without angina pectoris   . Poorly controlled type 2 diabetes mellitus with peripheral neuropathy ([place])   . Acute on chronic anemia   . History of below knee amputation, right ([place]) 12/31/2018  . S/P CABG x 3 06/28/2018  . Coronary artery disease involving native coronary artery of native heart with angina pectoris ([place]) 06/25/2018  . Type 2 diabetes mellitus with vascular disease ([place]) 06/24/2018  . Acute on chronic diastolic CHF (congestive heart failure) ([place]) 06/24/2018  . Non-ST elevation (NSTEMI) myocardial infarction ([place]) 06/24/2018  . Chest pain 06/23/2018  . Glaucoma 09/17/2017  . Diabetic retinopathy ([place]) 09/17/2017  . Otitis media 11/10/2016  . Decreased visual acuity 11/10/2016  . Spinal stenosis, lumbar region with neurogenic claudication 10/22/2016  . Other spondylosis with radiculopathy, lumbar region 10/13/2016  . Lipoma of abdominal wall 10/05/2016  . Severe protein-calorie malnutrition ([place]) 09/04/2016  . Anemia of chronic disease 09/04/2016  . Diabetic polyneuropathy associated with type 2 diabetes mellitus ([place]) 05/07/2016  . Orthostatic hypotension 05/07/2016  . Cerebrovascular disease 04/27/2016  . N&V (nausea and vomiting) 04/01/2016  . ICH (intracerebral hemorrhage) ([place]) 03/30/2016  . Visit for preventive health examination 05/21/2015  . Breast cancer screening 05/21/2015  . Arm lesion 07/02/2014  . Pain in limb 01/13/2014  . Neuropathic pain of both legs 01/13/2014  . Hip pain 01/12/2014  . Allergic rhinitis 03/10/2013  . Back pain 03/08/2013  . Depression 12/18/2012  . PVD (peripheral vascular disease) ( [place]) 01/21/2012  . Hyperlipidemia 12/06/2010  . Overweight(278.02) 12/06/2010  . RESTLESS LEG SYNDROME 10/25/2010  . Migraine without aura 10/07/2010  . Hereditary and idiopathic peripheral neuropathy  10/07/2010  . Essential hypertension, benign 10/07/2010  . DISTURBANCE OF SKIN SENSATION 10/07/2010  . HEART MURMUR, HX OF 10/07/2010  . NEPHROLITHIASIS, HX OF 10/07/2010   Past Medical History:  Diagnosis Date  . Abscess of great toe, right   . Acute on chronic diastolic CHF (congestive heart failure) ([place]) 06/24/2018  . Acute osteomyelitis of toe, left ([place]) 09/05/2016  . Amputation of right great toe ([place]) 12/22/2018  . Anxiety   . Cataract    left - surgery to remove  . Cellulitis of foot, right 12/11/2018  . COMMON MIGRAINE 10/07/2010  . Coronary artery disease   . Decreased visual acuity 11/10/2016  . Depression 12/18/2012  . Diabetes mellitus type II, uncontrolled ([place]) 10/07/2010   Qualifier: Diagnosis of  By: Charlett Blake MD, [name]    . Diabetic foot infection ([place]) 08/26/2016  . Diabetic infection of right foot ([place])   . Disturbance of skin  sensation 10/07/2010  . Gangrene of right foot ([place])   . GERD (gastroesophageal reflux disease)   . Heart murmur   . History of kidney stones    "years ago" - passed stones  . Hyperlipidemia 12/06/2010  . Hypertension   . Hypothyroidism   . Lipoma of abdominal wall 10/05/2016  . Overweight(278.02) 12/06/2010  . PERIPHERAL NEUROPATHY, FEET 10/07/2010   11/17/2019  . PVD (peripheral vascular disease) ([place]) 01/21/2012  . RESTLESS LEG SYNDROME 10/25/2010  . Stroke [facility]) 2014, 2017   most recently in 2/17 - intracerebral hemorrhage    Family History  Problem Relation Age of Onset  . Arthritis Mother   . Stroke Brother        previous smoker  . Alcohol abuse Brother        in remission  . Leukemia Brother   . Diabetes Paternal Grandmother   . Healthy Son     Past Surgical History:  Procedure Laterality Date  . AMPUTATION Right 12/13/2018   Procedure: AMPUTATION RIGHT GREAT TOE, LOCAL RELOCATION OF TISSUE FOR WOUND CLOSURE 9cm x 3cm, VAC APPLICATION;  Surgeon: [name], MD;  Location: [place];  Service: Orthopedics;  Laterality: Right;  .  AMPUTATION Right 12/31/2018   Procedure: RIGHT BELOW KNEE AMPUTATION;  Surgeon: [name], MD;  Location: [place];  Service: Orthopedics;  Laterality: Right;  . AMPUTATION Left 09/21/2019   Procedure: LEFT BELOW KNEE AMPUTATION;  Surgeon: [name], MD;  Location: [place];  Service: Orthopedics;  Laterality: Left;  . AMPUTATION TOE Left 09/09/2016   Procedure: AMPUTATION OF LEFT GREAT TOE;  Surgeon: Milly Jakob, MD;  Location: [place];  Service: Orthopedics;  Laterality: Left;  . BELOW KNEE LEG AMPUTATION Left 09/21/2019  . CARDIAC CATHETERIZATION  06/24/2018  . CORONARY ARTERY BYPASS GRAFT N/A 06/28/2018   Procedure: CORONARY ARTERY BYPASS GRAFTING (CABG) times  four, using left internal mammary artery, endoscopically harvested right saphenous vein, and harvested left radial artery;  Surgeon: [name], MD;  Location: [place];  Service: Open Heart Surgery;  Laterality: N/A;  . ENDOVEIN HARVEST OF GREATER SAPHENOUS VEIN Right 06/28/2018   Procedure: ENDOVEIN HARVEST OF GREATER SAPHENOUS VEIN;  Surgeon: [name], MD;  Location: [place];  Service: Open Heart Surgery;  Laterality: Right;  . EYE SURGERY Left 06/2017   [facility]  . LEFT HEART CATH AND CORONARY ANGIOGRAPHY N/A 06/24/2018   Procedure: LEFT HEART CATH AND CORONARY ANGIOGRAPHY;  Surgeon: Troy Sine, MD;  Location: [place] [facility];  Service: Cardiovascular;  Laterality: N/A;  . RADIAL ARTERY HARVEST Left 06/28/2018   Procedure: RADIAL ARTERY HARVEST;  Surgeon: [name], MD;  Location: [place];  Service: Open Heart Surgery;  Laterality: Left;  . STUMP REVISION Left 11/18/2019   Procedure: REVISION LEFT BELOW KNEE AMPUTATION;  Surgeon: [name], MD;  Location: [place];  Service: Orthopedics;  Laterality: Left;  . TEE WITHOUT CARDIOVERSION N/A 06/28/2018   Procedure: TRANSESOPHAGEAL ECHOCARDIOGRAM (TEE);  Surgeon: [name], MD;  Location: [place];  Service: Open Heart  Surgery;  Laterality: N/A;  . WISDOM TOOTH EXTRACTION     Social History   Occupational History  . Occupation: Glass blower/designer  Tobacco Use  . Smoking status: Former Smoker    Packs/day: 0.50    Quit date: 09/08/2005    Years since quitting: 14.4  . Smokeless tobacco: Never Used  . Tobacco comment: " 1 smoke a week in the  past"  Substance and Sexual Activity  . Alcohol use: No    Alcohol/week: 0.0 standard drinks  . Drug use: Not Currently  . Sexual activity: Yes    Partners: Male    Birth control/protection: Post-menopausal

## 2020-02-08 DIAGNOSIS — E1142 Type 2 diabetes mellitus with diabetic polyneuropathy: Secondary | ICD-10-CM | POA: Diagnosis not present

## 2020-02-08 DIAGNOSIS — N183 Chronic kidney disease, stage 3 unspecified: Secondary | ICD-10-CM | POA: Diagnosis not present

## 2020-02-08 DIAGNOSIS — F329 Major depressive disorder, single episode, unspecified: Secondary | ICD-10-CM | POA: Diagnosis not present

## 2020-02-08 DIAGNOSIS — Z4781 Encounter for orthopedic aftercare following surgical amputation: Secondary | ICD-10-CM | POA: Diagnosis not present

## 2020-02-08 DIAGNOSIS — E11319 Type 2 diabetes mellitus with unspecified diabetic retinopathy without macular edema: Secondary | ICD-10-CM | POA: Diagnosis not present

## 2020-02-08 DIAGNOSIS — E1151 Type 2 diabetes mellitus with diabetic peripheral angiopathy without gangrene: Secondary | ICD-10-CM | POA: Diagnosis not present

## 2020-02-08 DIAGNOSIS — E785 Hyperlipidemia, unspecified: Secondary | ICD-10-CM | POA: Diagnosis not present

## 2020-02-08 DIAGNOSIS — D631 Anemia in chronic kidney disease: Secondary | ICD-10-CM | POA: Diagnosis not present

## 2020-02-08 DIAGNOSIS — E1122 Type 2 diabetes mellitus with diabetic chronic kidney disease: Secondary | ICD-10-CM | POA: Diagnosis not present

## 2020-02-08 DIAGNOSIS — M4726 Other spondylosis with radiculopathy, lumbar region: Secondary | ICD-10-CM | POA: Diagnosis not present

## 2020-02-08 DIAGNOSIS — M48062 Spinal stenosis, lumbar region with neurogenic claudication: Secondary | ICD-10-CM | POA: Diagnosis not present

## 2020-02-08 DIAGNOSIS — I2510 Atherosclerotic heart disease of native coronary artery without angina pectoris: Secondary | ICD-10-CM | POA: Diagnosis not present

## 2020-02-08 DIAGNOSIS — I130 Hypertensive heart and chronic kidney disease with heart failure and stage 1 through stage 4 chronic kidney disease, or unspecified chronic kidney disease: Secondary | ICD-10-CM | POA: Diagnosis not present

## 2020-02-08 DIAGNOSIS — I5032 Chronic diastolic (congestive) heart failure: Secondary | ICD-10-CM | POA: Diagnosis not present

## 2020-02-08 DIAGNOSIS — E039 Hypothyroidism, unspecified: Secondary | ICD-10-CM | POA: Diagnosis not present

## 2020-02-08 DIAGNOSIS — G546 Phantom limb syndrome with pain: Secondary | ICD-10-CM | POA: Diagnosis not present

## 2020-02-09 ENCOUNTER — Ambulatory Visit: Payer: BC Managed Care – PPO | Admitting: Internal Medicine

## 2020-02-10 ENCOUNTER — Other Ambulatory Visit: Payer: Self-pay | Admitting: Medical

## 2020-02-10 ENCOUNTER — Other Ambulatory Visit: Payer: Self-pay

## 2020-02-10 ENCOUNTER — Telehealth (INDEPENDENT_AMBULATORY_CARE_PROVIDER_SITE_OTHER): Payer: BC Managed Care – PPO | Admitting: Family Medicine

## 2020-02-10 DIAGNOSIS — E43 Unspecified severe protein-calorie malnutrition: Secondary | ICD-10-CM | POA: Diagnosis not present

## 2020-02-10 DIAGNOSIS — E1165 Type 2 diabetes mellitus with hyperglycemia: Secondary | ICD-10-CM

## 2020-02-10 DIAGNOSIS — E1142 Type 2 diabetes mellitus with diabetic polyneuropathy: Secondary | ICD-10-CM

## 2020-02-10 DIAGNOSIS — M4726 Other spondylosis with radiculopathy, lumbar region: Secondary | ICD-10-CM | POA: Diagnosis not present

## 2020-02-10 DIAGNOSIS — I10 Essential (primary) hypertension: Secondary | ICD-10-CM

## 2020-02-10 DIAGNOSIS — D638 Anemia in other chronic diseases classified elsewhere: Secondary | ICD-10-CM

## 2020-02-10 DIAGNOSIS — N183 Chronic kidney disease, stage 3 unspecified: Secondary | ICD-10-CM

## 2020-02-10 DIAGNOSIS — E039 Hypothyroidism, unspecified: Secondary | ICD-10-CM

## 2020-02-10 DIAGNOSIS — E119 Type 2 diabetes mellitus without complications: Secondary | ICD-10-CM

## 2020-02-10 DIAGNOSIS — E785 Hyperlipidemia, unspecified: Secondary | ICD-10-CM

## 2020-02-12 MED ORDER — INSULIN GLARGINE 100 UNIT/ML SOLOSTAR PEN: PEN_INJECTOR | SUBCUTANEOUS | 3 refills | Status: DC

## 2020-02-12 NOTE — Assessment & Plan Note (Signed)
hgba1c unacceptable, she does report she has been eating better since her last set of labs and her numbers have improved. [facility] declined to accept her case due to her history of no shows. She notes her daughter is her driver and her daughter needs surgery so we will increase her Lantus to 27 units. She has only been using her short acting insulin once a day due to only eating one meal a day. minimize simple carbs. Increase exercise as toleated.

## 2020-02-12 NOTE — Assessment & Plan Note (Signed)
Monitor and report any concerns. no changes to meds. Encouraged heart healthy diet such as the DASH diet and exercise as tolerated.  

## 2020-02-12 NOTE — Assessment & Plan Note (Signed)
On Levothyroxine, continue to monitor 

## 2020-02-12 NOTE — Assessment & Plan Note (Signed)
Increase protein intake and try to eat more meals a day with high protein and veg

## 2020-02-12 NOTE — Assessment & Plan Note (Signed)
Tolerating statin, encouraged heart healthy diet, avoid trans fats, minimize simple carbs and saturated fats. Increase exercise as tolerated 

## 2020-02-12 NOTE — Progress Notes (Signed)
Virtual Visit via phone Note  I connected with [name] on 02/10/20 at  8:40 AM EDT by a phone enabled telemedicine application and verified that I am speaking with the correct person using two identifiers.  Location: Patient: home Provider: home, patient and provider are in the visit   I discussed the limitations of evaluation and management by telemedicine and the availability of in person appointments. The patient expressed understanding and agreed to proceed. Kem Boroughs, CMA was able to get the patient set up on a visit, phone after being unable to set up on a video visit.    Subjective:    [name], female    DOB: [birth year], 54 y.o.   MRN: [id number]  Chief Complaint  Patient presents with  . Follow-up    HPI Patient is in today for follow up on chronic medical concerns. No recent febrile illness or hospitalizations. She is still struggling with her sugars still being high but she reports she has been eating better and taking in less carbs, she has quit sodas and her sugars are improving. She is only eating one meal a day and using her short acting insulin at that time. Denies CP/palp/SOB/HA/congestion/fevers/GI or GU c/o. Taking meds as prescribed  Past Medical History:  Diagnosis Date  . Abscess of great toe, right   . Acute on chronic diastolic CHF (congestive heart failure) ([facility]) 06/24/2018  . Acute osteomyelitis of toe, left ([facility] [facility]) 09/05/2016  . Amputation of right great toe ([facility]) 12/22/2018  . Anxiety   . Cataract    left - surgery to remove  . Cellulitis of foot, right 12/11/2018  . COMMON MIGRAINE 10/07/2010  . Coronary artery disease   . Decreased visual acuity 11/10/2016  . Depression 12/18/2012  . Diabetes mellitus type II, uncontrolled ([facility]) 10/07/2010   Qualifier: Diagnosis of  By: Charlett Blake MD, [name]    . Diabetic foot infection ([facility]) 08/26/2016  . Diabetic infection of right foot ([facility])   . Disturbance of skin sensation 10/07/2010  .  Gangrene of right foot ([facility])   . GERD (gastroesophageal reflux disease)   . Heart murmur   . History of kidney stones    "years ago" - passed stones  . Hyperlipidemia 12/06/2010  . Hypertension   . Hypothyroidism   . Lipoma of abdominal wall 10/05/2016  . Overweight(278.02) 12/06/2010  . PERIPHERAL NEUROPATHY, FEET 10/07/2010   11/17/2019  . PVD (peripheral vascular disease) ([facility]) 01/21/2012  . RESTLESS LEG SYNDROME 10/25/2010  . Stroke [facility]) 2014, 2017   most recently in 2/17 - intracerebral hemorrhage    Past Surgical History:  Procedure Laterality Date  . AMPUTATION Right 12/13/2018   Procedure: AMPUTATION RIGHT GREAT TOE, LOCAL RELOCATION OF TISSUE FOR WOUND CLOSURE 9cm x 3cm, VAC APPLICATION;  Surgeon: [name], MD;  Location: [facility];  Service: Orthopedics;  Laterality: Right;  . AMPUTATION Right 12/31/2018   Procedure: RIGHT BELOW KNEE AMPUTATION;  Surgeon: [name], MD;  Location: [facility] [place];  Service: Orthopedics;  Laterality: Right;  . AMPUTATION Left 09/21/2019   Procedure: LEFT BELOW KNEE AMPUTATION;  Surgeon: [name], MD;  Location: [facility];  Service: Orthopedics;  Laterality: Left;  . AMPUTATION TOE Left 09/09/2016   Procedure: AMPUTATION OF LEFT GREAT TOE;  Surgeon: Milly Jakob, MD;  Location: [facility];  Service: Orthopedics;  Laterality: Left;  . BELOW KNEE LEG AMPUTATION Left 09/21/2019  . CARDIAC CATHETERIZATION  06/24/2018  . CORONARY ARTERY BYPASS  GRAFT N/A 06/28/2018   Procedure: CORONARY ARTERY BYPASS GRAFTING (CABG) times  four, using left internal mammary artery, endoscopically harvested right saphenous vein, and harvested left radial artery;  Surgeon: [name], MD;  Location: [facility];  Service: Open Heart Surgery;  Laterality: N/A;  . ENDOVEIN HARVEST OF GREATER SAPHENOUS VEIN Right 06/28/2018   Procedure: ENDOVEIN HARVEST OF GREATER SAPHENOUS VEIN;  Surgeon: [name], MD;  Location: [place];  Service: Open Heart  Surgery;  Laterality: Right;  . EYE SURGERY Left 06/2017   [facility]  . LEFT HEART CATH AND CORONARY ANGIOGRAPHY N/A 06/24/2018   Procedure: LEFT HEART CATH AND CORONARY ANGIOGRAPHY;  Surgeon: Troy Sine, MD;  Location: [facility];  Service: Cardiovascular;  Laterality: N/A;  . RADIAL ARTERY HARVEST Left 06/28/2018   Procedure: RADIAL ARTERY HARVEST;  Surgeon: [name], MD;  Location: [facility];  Service: Open Heart Surgery;  Laterality: Left;  . STUMP REVISION Left 11/18/2019   Procedure: REVISION LEFT BELOW KNEE AMPUTATION;  Surgeon: [name], MD;  Location: [facility];  Service: Orthopedics;  Laterality: Left;  . TEE WITHOUT CARDIOVERSION N/A 06/28/2018   Procedure: TRANSESOPHAGEAL ECHOCARDIOGRAM (TEE);  Surgeon: [name], MD;  Location: [facility];  Service: Open Heart Surgery;  Laterality: N/A;  . WISDOM TOOTH EXTRACTION      Family History  Problem Relation Age of Onset  . Arthritis Mother   . Stroke Brother        previous smoker  . Alcohol abuse Brother        in remission  . Leukemia Brother   . Diabetes Paternal Grandmother   . Healthy Son     Social History   Socioeconomic History  . Marital status: Single    Spouse name: Not on file  . Number of children: Not on file  . Years of education: Not on file  . Highest education level: Not on file  Occupational History  . Occupation: Glass blower/designer  Tobacco Use  . Smoking status: Former Smoker    Packs/day: 0.50    Quit date: 09/08/2005    Years since quitting: 14.4  . Smokeless tobacco: Never Used  . Tobacco comment: " 1 smoke a week in the past"  Substance and Sexual Activity  . Alcohol use: No    Alcohol/week: 0.0 standard drinks  . Drug use: Not Currently  . Sexual activity: Yes    Partners: Male    Birth control/protection: Post-menopausal  Other Topics Concern  . Not on file  Social History Narrative   Lives with husband in a two story home.  Has one child.     Works as a  Glass blower/designer.     Education: high school.   Social Determinants of Health   Financial Resource Strain:   . Difficulty of Paying Living Expenses:   Food Insecurity:   . Worried About Charity fundraiser in the Last Year:   . Arboriculturist in the Last Year:   Transportation Needs:   . Film/video editor (Medical):   Marland Kitchen Lack of Transportation (Non-Medical):   Physical Activity:   . Days of Exercise per Week:   . Minutes of Exercise per Session:   Stress:   . Feeling of Stress :   Social Connections:   . Frequency of Communication with Friends and Family:   . Frequency of Social Gatherings with Friends and Family:   . Attends Religious Services:   .  Active Member of Clubs or Organizations:   . Attends Archivist Meetings:   Marland Kitchen Marital Status:   Intimate Partner Violence:   . Fear of Current or Ex-Partner:   . Emotionally Abused:   Marland Kitchen Physically Abused:   . Sexually Abused:     Outpatient Medications Prior to Visit  Medication Sig Dispense Refill  . acetaminophen (TYLENOL) 325 MG tablet Take 2 tablets (650 mg total) by mouth every 6 (six) hours as needed for mild pain or headache.    Marland Kitchen atorvastatin (LIPITOR) 80 MG tablet Take 1 tablet (80 mg total) by mouth daily at 6 PM. 30 tablet 1  . azelastine (ASTELIN) 0.1 % nasal spray Place 2 sprays into both nostrils 2 (two) times daily. Use in each nostril as directed 30 mL 3  . blood glucose meter kit and supplies Dispense based on patient and insurance preference. Use up to four times daily as directed. DX E11.59 1 each 11  . docusate sodium (COLACE) 100 MG capsule Take 1 capsule (100 mg total) by mouth 2 (two) times daily. 10 capsule 0  . escitalopram (LEXAPRO) 10 MG tablet Take 1 tablet (10 mg total) by mouth daily. 90 tablet 1  . ferrous sulfate 325 (65 FE) MG EC tablet Take 1 tablet (325 mg total) by mouth daily with breakfast. 30 tablet 3  . insulin aspart (NOVOLOG FLEXPEN) 100 UNIT/ML FlexPen Inject 4 Units into  the skin 3 (three) times daily with meals. 15 mL 5  . Insulin Pen Needle (PEN NEEDLES 31GX5/16") 31G X 8 MM MISC Use as directed with Lantus and Novolog pen 100 each 5  . levothyroxine (SYNTHROID) 25 MCG tablet TAKE 1 TABLET (25 MCG TOTAL) BY MOUTH DAILY BEFORE BREAKFAST. (Patient taking differently: Take 25 mcg by mouth daily before breakfast. ) 90 tablet 1  . losartan (COZAAR) 25 MG tablet Take 1 tablet (25 mg total) by mouth daily. Dc hydralazine rx just sent. 30 tablet 3  . metFORMIN (GLUCOPHAGE) 1000 MG tablet Take 1 tablet (1,000 mg total) by mouth 2 (two) times daily with a meal. 180 tablet 3  . methocarbamol (ROBAXIN) 500 MG tablet Take 1 tablet (500 mg total) by mouth every 6 (six) hours as needed for muscle spasms. 60 tablet 0  . metoCLOPramide (REGLAN) 5 MG tablet Take 1 tablet (5 mg total) by mouth 4 (four) times daily -  before meals and at bedtime. 90 tablet 0  . Multiple Vitamin (MULTIVITAMIN WITH MINERALS) TABS tablet Take 1 tablet by mouth daily.    Marland Kitchen oxyCODONE (OXY IR/ROXICODONE) 5 MG immediate release tablet Take 1 tablet (5 mg total) by mouth every 4 (four) hours as needed for moderate pain (pain score 4-6). 30 tablet 0  . oxyCODONE-acetaminophen (PERCOCET) 5-325 MG tablet Take 1 tablet by mouth every 8 (eight) hours as needed for severe pain. 20 tablet 0  . pantoprazole (PROTONIX) 40 MG tablet Take 1 tablet (40 mg total) by mouth daily. 30 tablet 0  . polyethylene glycol (MIRALAX / GLYCOLAX) 17 g packet Take 17 g by mouth daily. 14 each 0  . amLODipine (NORVASC) 10 MG tablet TAKE 1 TABLET BY MOUTH EVERY DAY 30 tablet 1  . insulin glargine (LANTUS) 100 UNIT/ML Solostar Pen 20 units SQ qhs and then increase by 2 units every 3 days if no sugars below 150 15 mL 3  . doxycycline (VIBRA-TABS) 100 MG tablet Take 1 tablet (100 mg total) by mouth 2 (two) times daily. 20 tablet 0  No facility-administered medications prior to visit.    Allergies  Allergen Reactions  . Lyrica  [Pregabalin] Other (See Comments)    MADE PATIENT VERY EMOTIONAL AND WOULD CRY EASILY  . Morphine And Related Nausea And Vomiting  . Penicillins Other (See Comments)    "Made patient feel sick" Did it involve swelling of the face/tongue/throat, SOB, or low BP? No Did it involve sudden or severe rash/hives, skin peeling, or any reaction on the inside of your mouth or nose? No Did you need to seek medical attention at a hospital or doctor's office? No When did it last happen?April 2020 If all above answers are "NO", may proceed with cephalosporin use.   . Tramadol Nausea And Vomiting    Pt cant tolerate this pain med.     Review of Systems  Constitutional: Positive for malaise/fatigue. Negative for fever.  HENT: Negative for congestion.   Eyes: Negative for blurred vision.  Respiratory: Negative for shortness of breath.   Cardiovascular: Negative for chest pain, palpitations and leg swelling.  Gastrointestinal: Negative for abdominal pain, blood in stool and nausea.  Genitourinary: Negative for dysuria and frequency.  Musculoskeletal: Positive for joint pain and myalgias. Negative for falls.  Skin: Negative for rash.  Neurological: Positive for weakness. Negative for dizziness, loss of consciousness and headaches.  Endo/Heme/Allergies: Negative for environmental allergies.  Psychiatric/Behavioral: Positive for depression. The patient is nervous/anxious.        Objective:    Physical Exam unable to obtain via phone visit  LMP 02/12/2011 (Exact Date)  Wt Readings from Last 3 Encounters:  01/04/20 140 lb (63.5 kg)  12/20/19 140 lb (63.5 kg)  12/15/19 140 lb (63.5 kg)    Diabetic Foot Exam - Simple   No data filed     Lab Results  Component Value Date   WBC 9.5 12/30/2019   HGB 11.5 (L) 12/30/2019   HCT 35.2 (L) 12/30/2019   PLT 496.0 (H) 12/30/2019   GLUCOSE 198 (H) 11/25/2019   CHOL 237 (H) 11/25/2019   TRIG 150.0 (H) 11/25/2019   HDL 47.40 11/25/2019    LDLDIRECT 133.0 03/02/2017   LDLCALC 159 (H) 11/25/2019   ALT 11 11/25/2019   AST 14 11/25/2019   NA 134 (L) 11/25/2019   K 4.3 11/25/2019   CL 96 11/25/2019   CREATININE 0.89 11/25/2019   BUN 17 11/25/2019   CO2 31 11/25/2019   TSH 3.396 07/02/2018   INR 1.30 06/28/2018   HGBA1C 13.0 (H) 11/25/2019   MICROALBUR 174.3 (H) 08/14/2017    Lab Results  Component Value Date   TSH 3.396 07/02/2018   Lab Results  Component Value Date   WBC 9.5 12/30/2019   HGB 11.5 (L) 12/30/2019   HCT 35.2 (L) 12/30/2019   MCV 85.1 12/30/2019   PLT 496.0 (H) 12/30/2019   Lab Results  Component Value Date   NA 134 (L) 11/25/2019   K 4.3 11/25/2019   CO2 31 11/25/2019   GLUCOSE 198 (H) 11/25/2019   BUN 17 11/25/2019   CREATININE 0.89 11/25/2019   BILITOT 0.3 11/25/2019   ALKPHOS 119 (H) 11/25/2019   AST 14 11/25/2019   ALT 11 11/25/2019   PROT 6.1 11/25/2019   ALBUMIN 2.8 (L) 11/25/2019   CALCIUM 8.6 11/25/2019   ANIONGAP 12 10/11/2019   GFR 65.62 11/25/2019   Lab Results  Component Value Date   CHOL 237 (H) 11/25/2019   Lab Results  Component Value Date   HDL 47.40 11/25/2019   Lab  Results  Component Value Date   LDLCALC 159 (H) 11/25/2019   Lab Results  Component Value Date   TRIG 150.0 (H) 11/25/2019   Lab Results  Component Value Date   CHOLHDL 5 11/25/2019   Lab Results  Component Value Date   HGBA1C 13.0 (H) 11/25/2019       Assessment & Plan:   Problem List Items Addressed This Visit    Severe protein-calorie malnutrition ([facility]) - Primary (Chronic)    Increase protein intake and try to eat more meals a day with high protein and veg      Relevant Orders   Prealbumin   Anemia of chronic disease (Chronic)   Relevant Orders   CBC   Retic   Essential hypertension, benign    Monitor and report any concerns. no changes to meds. Encouraged heart healthy diet such as the DASH diet and exercise as tolerated.       Relevant Orders   Comprehensive metabolic  panel   TSH   Hyperlipidemia    Tolerating statin, encouraged heart healthy diet, avoid trans fats, minimize simple carbs and saturated fats. Increase exercise as tolerated      Relevant Orders   Lipid panel   Other spondylosis with radiculopathy, lumbar region   CKD (chronic kidney disease), stage III    Hydrate and monitor      Hypothyroidism    On Levothyroxine, continue to monitor      Poorly controlled type 2 diabetes mellitus with peripheral neuropathy (HCC)   Relevant Medications   insulin glargine (LANTUS) 100 UNIT/ML Solostar Pen   Other Relevant Orders   Hemoglobin A1c   Diabetes mellitus type 2 in nonobese ([place])    hgba1c unacceptable, she does report she has been eating better since her last set of labs and her numbers have improved. [facility] declined to accept her case due to her history of no shows. She notes her daughter is her driver and her daughter needs surgery so we will increase her Lantus to 27 units. She has only been using her short acting insulin once a day due to only eating one meal a day. minimize simple carbs. Increase exercise as toleated.       Relevant Medications   insulin glargine (LANTUS) 100 UNIT/ML Solostar Pen   Uncontrolled type 2 diabetes mellitus with hyperglycemia (HCC)   Relevant Medications   insulin glargine (LANTUS) 100 UNIT/ML Solostar Pen      I have discontinued Artemisa Sladek. Dohner's doxycycline. I have also changed her insulin glargine. Additionally, I am having her maintain her acetaminophen, docusate sodium, levothyroxine, metoCLOPramide, multivitamin with minerals, atorvastatin, methocarbamol, polyethylene glycol, pantoprazole, oxyCODONE, escitalopram, metFORMIN, blood glucose meter kit and supplies, PEN NEEDLES 31GX5/16", oxyCODONE-acetaminophen, losartan, ferrous sulfate, azelastine, and NovoLOG FlexPen.  Meds ordered this encounter  Medications  . insulin glargine (LANTUS) 100 UNIT/ML Solostar Pen    Sig: 27 units  SQ qhs and then increase by 2 units every 3 days if no sugars below 150    Dispense:  15 mL    Refill:  3    I discussed the assessment and treatment plan with the patient. The patient was provided an opportunity to ask questions and all were answered. The patient agreed with the plan and demonstrated an understanding of the instructions.   The patient was advised to call back or seek an in-person evaluation if the symptoms worsen or if the condition fails to improve as anticipated.  I provided 25 minutes of non-face-to-face  time during this encounter.   Penni Homans, MD

## 2020-02-12 NOTE — Assessment & Plan Note (Signed)
Hydrate and monitor 

## 2020-02-21 ENCOUNTER — Ambulatory Visit: Payer: BC Managed Care – PPO | Admitting: Internal Medicine

## 2020-02-21 DIAGNOSIS — E1122 Type 2 diabetes mellitus with diabetic chronic kidney disease: Secondary | ICD-10-CM | POA: Diagnosis not present

## 2020-02-21 DIAGNOSIS — E1151 Type 2 diabetes mellitus with diabetic peripheral angiopathy without gangrene: Secondary | ICD-10-CM | POA: Diagnosis not present

## 2020-02-21 DIAGNOSIS — Z4781 Encounter for orthopedic aftercare following surgical amputation: Secondary | ICD-10-CM | POA: Diagnosis not present

## 2020-02-21 DIAGNOSIS — E785 Hyperlipidemia, unspecified: Secondary | ICD-10-CM | POA: Diagnosis not present

## 2020-02-21 DIAGNOSIS — M4726 Other spondylosis with radiculopathy, lumbar region: Secondary | ICD-10-CM | POA: Diagnosis not present

## 2020-02-21 DIAGNOSIS — G546 Phantom limb syndrome with pain: Secondary | ICD-10-CM | POA: Diagnosis not present

## 2020-02-21 DIAGNOSIS — E039 Hypothyroidism, unspecified: Secondary | ICD-10-CM | POA: Diagnosis not present

## 2020-02-21 DIAGNOSIS — E1142 Type 2 diabetes mellitus with diabetic polyneuropathy: Secondary | ICD-10-CM | POA: Diagnosis not present

## 2020-02-21 DIAGNOSIS — I5032 Chronic diastolic (congestive) heart failure: Secondary | ICD-10-CM | POA: Diagnosis not present

## 2020-02-21 DIAGNOSIS — E11319 Type 2 diabetes mellitus with unspecified diabetic retinopathy without macular edema: Secondary | ICD-10-CM | POA: Diagnosis not present

## 2020-02-21 DIAGNOSIS — N183 Chronic kidney disease, stage 3 unspecified: Secondary | ICD-10-CM | POA: Diagnosis not present

## 2020-02-21 DIAGNOSIS — F329 Major depressive disorder, single episode, unspecified: Secondary | ICD-10-CM | POA: Diagnosis not present

## 2020-02-21 DIAGNOSIS — M48062 Spinal stenosis, lumbar region with neurogenic claudication: Secondary | ICD-10-CM | POA: Diagnosis not present

## 2020-02-21 DIAGNOSIS — D631 Anemia in chronic kidney disease: Secondary | ICD-10-CM | POA: Diagnosis not present

## 2020-02-21 DIAGNOSIS — I130 Hypertensive heart and chronic kidney disease with heart failure and stage 1 through stage 4 chronic kidney disease, or unspecified chronic kidney disease: Secondary | ICD-10-CM | POA: Diagnosis not present

## 2020-02-21 DIAGNOSIS — I2510 Atherosclerotic heart disease of native coronary artery without angina pectoris: Secondary | ICD-10-CM | POA: Diagnosis not present

## 2020-02-23 ENCOUNTER — Telehealth: Payer: Self-pay | Admitting: Orthopedic Surgery

## 2020-02-23 ENCOUNTER — Other Ambulatory Visit: Payer: Self-pay | Admitting: Physician Assistant

## 2020-02-23 MED ORDER — OXYCODONE-ACETAMINOPHEN 5-325 MG PO TABS: 1.0000 | ORAL_TABLET | Freq: Three times a day (TID) | ORAL | 0 refills | Status: DC | PRN

## 2020-02-23 NOTE — Telephone Encounter (Signed)
Patient called.   She needs a refill on her pain medications.   Call back: [phone]

## 2020-02-23 NOTE — Telephone Encounter (Signed)
Please advise. Patients last Oxy 5/325 mg prescribed 12/15/19 for BKA. Thanks

## 2020-02-23 NOTE — Telephone Encounter (Signed)
I will refill

## 2020-02-23 NOTE — Telephone Encounter (Signed)
Noted, patient was informed.

## 2020-02-28 ENCOUNTER — Other Ambulatory Visit: Payer: BC Managed Care – PPO

## 2020-03-01 ENCOUNTER — Ambulatory Visit: Payer: BC Managed Care – PPO | Admitting: Orthopedic Surgery

## 2020-03-13 ENCOUNTER — Ambulatory Visit: Payer: BC Managed Care – PPO | Admitting: Orthopedic Surgery

## 2020-03-15 ENCOUNTER — Other Ambulatory Visit: Payer: Self-pay

## 2020-03-15 ENCOUNTER — Ambulatory Visit (INDEPENDENT_AMBULATORY_CARE_PROVIDER_SITE_OTHER): Payer: BC Managed Care – PPO | Admitting: Physician Assistant

## 2020-03-15 ENCOUNTER — Encounter: Payer: Self-pay | Admitting: Orthopedic Surgery

## 2020-03-15 VITALS — Ht 62.0 in | Wt 140.0 lb

## 2020-03-15 DIAGNOSIS — Z89511 Acquired absence of right leg below knee: Secondary | ICD-10-CM

## 2020-03-15 NOTE — Progress Notes (Signed)
Office Visit Note   Patient: [name]           Date of Birth: 1965-10-22           MRN: [id number] Visit Date: 03/15/2020              Requested by: Mosie Lukes, MD [place] [place],  [place] [address] PCP: Mosie Lukes, MD  Chief Complaint  Patient presents with  . Left Leg - Routine Post Op, Follow-up    11/18/19 Left BKA      HPI: This is a pleasant woman who is status post right below-knee amputation.  Also status post left below-knee amputation revision approximately 3 months ago.  She does have a prosthetic fitting with [employer] for the left side coming up.  On the right side she is concerned because she is having a lot of itching and breaking down her skin a little bit when she scratches.  On the left she has a small area of skin breakdown as well.  She presents today on the right side without any socks and has just her liner and socket on.  On the left with just a Ace wrap  Assessment & Plan: Visit Diagnoses: No diagnosis found.  Plan: Patient will follow up in 1 month.  I have instructed her that she should be wearing. Vive shrinker as her base layer on both sides.  She needs to not be wearing her liner without a sock.  She may take Benadryl as needed for itching but should not be scratching the area.  She understands she will follow-up in 1 month  Follow-Up Instructions: No follow-ups on file.   Ortho Exam  Patient is alert, oriented, no adenopathy, well-dressed, normal affect, normal respiratory effort. Focused examination of the right lower side demonstrates status post below-knee amputation she has some skin abrasions where she is obviously been scratching.  No surrounding cellulitis or foul odor or drainage on the left side small 3 cm skin breakdown at the end of her prosthetic she is has overall a well-healed incision no erythema no drainage some resolving eschar  Imaging: No results found. No images are attached to the  encounter.  Labs: Lab Results  Component Value Date   HGBA1C 13.0 (H) 11/25/2019   HGBA1C 14.1 (H) 09/21/2019   HGBA1C 10.5 (H) 12/12/2018   ESRSEDRATE 130 (H) 12/12/2018   ESRSEDRATE 115 (H) 06/24/2018   ESRSEDRATE 107 (H) 09/04/2016   CRP 28.2 (H) 12/12/2018   CRP 7.2 (H) 09/04/2016   CRP 0.7 03/21/2015   LABURIC 3.2 06/13/2014   LABURIC 3.1 02/09/2014   REPTSTATUS 10/08/2019 FINAL 10/06/2019   GRAMSTAIN  12/13/2018    FEW WBC PRESENT,BOTH PMN AND MONONUCLEAR NO ORGANISMS SEEN    CULT >=100,000 COLONIES/mL CITROBACTER FREUNDII (A) 10/06/2019   LABORGA CITROBACTER FREUNDII (A) 10/06/2019     Lab Results  Component Value Date   ALBUMIN 2.8 (L) 11/25/2019   ALBUMIN 2.2 (L) 10/11/2019   ALBUMIN 2.1 (L) 09/28/2019   PREALBUMIN 12.7 (L) 09/04/2016   LABURIC 3.2 06/13/2014   LABURIC 3.1 02/09/2014    Lab Results  Component Value Date   MG 2.1 12/16/2018   MG 2.8 (H) 06/29/2018   MG 3.0 (H) 06/29/2018   No results found for: VD25OH  Lab Results  Component Value Date   PREALBUMIN 12.7 (L) 09/04/2016   CBC EXTENDED Latest Ref Rng & Units 12/30/2019 11/25/2019 10/11/2019  WBC 4.0 - 10.5  K/uL 9.5 10.2 8.3  RBC 3.87 - 5.11 Mil/uL 4.14 3.66(L) 3.52(L)  HGB 12.0 - 15.0 g/dL 11.5(L) 10.0(L) 9.5(L)  HCT 36 - 46 % 35.2(L) 30.8(L) 29.8(L)  PLT 150 - 400 K/uL 496.0(H) 501.0(H) 654(H)  NEUTROABS 1.4 - 7.7 K/uL 6.7 7.7 4.5  LYMPHSABS 0.7 - 4.0 K/uL 1.9 1.6 2.5     Body mass index is 25.61 kg/m.  Orders:  No orders of the defined types were placed in this encounter.  No orders of the defined types were placed in this encounter.    Procedures: No procedures performed  Clinical Data: No additional findings.  ROS:  All other systems negative, except as noted in the HPI. Review of Systems  Objective: Vital Signs: Ht 5\' 2"  (1.575 m)   Wt 140 lb (63.5 kg)   LMP 02/12/2011 (Exact Date)   BMI 25.61 kg/m   Specialty Comments:  No specialty comments  available.  PMFS History: Patient Active Problem List   Diagnosis Date Noted  . Polyradiculoneuropathy ([place]) 01/23/2020  . Sun-damaged skin 01/23/2020  . Uncontrolled type 2 diabetes mellitus with hyperglycemia ([place]) 11/25/2019  . Wound infection after surgery 11/25/2019  . Adjustment disorder with mixed anxiety and depressed mood   . S/P bilateral BKA (below knee amputation) ([place]) 09/27/2019  . Acute osteomyelitis of left foot ([place]) 09/21/2019  . Subacute osteomyelitis, left ankle and foot ([place])   . Pressure injury of left heel, unstageable ([facility]) 02/09/2019  . Diabetes mellitus type 2 in nonobese (H[facility])   . Labile blood pressure   . Labile blood glucose   . Phantom limb pain ([place])   . CKD (chronic kidney disease), stage III   . Hypothyroidism   . Chronic diastolic congestive heart failure ([place])   . Coronary artery disease involving coronary bypass graft of native heart without angina pectoris   . Poorly controlled type 2 diabetes mellitus with peripheral neuropathy ([facility])   . Acute on chronic anemia   . History of below knee amputation, right ([place]) 12/31/2018  . S/P CABG x 3 06/28/2018  . Coronary artery disease involving native coronary artery of native heart with angina pectoris ([place]) 06/25/2018  . Type 2 diabetes mellitus with vascular disease ([place]) 06/24/2018  . Acute on chronic diastolic CHF (congestive heart failure) ([place]) 06/24/2018  . Non-ST elevation (NSTEMI) myocardial infarction ([place]) 06/24/2018  . Chest pain 06/23/2018  . Glaucoma 09/17/2017  . Diabetic retinopathy ([place]) 09/17/2017  . Otitis media 11/10/2016  . Decreased visual acuity 11/10/2016  . Spinal stenosis, lumbar region with neurogenic claudication 10/22/2016  . Other spondylosis with radiculopathy, lumbar region 10/13/2016  . Lipoma of abdominal wall 10/05/2016  . Severe protein-calorie malnutrition ([place]) 09/04/2016  . Anemia of chronic disease 09/04/2016  . Diabetic polyneuropathy associated with type 2  diabetes mellitus ([place]) 05/07/2016  . Orthostatic hypotension 05/07/2016  . Cerebrovascular disease 04/27/2016  . N&V (nausea and vomiting) 04/01/2016  . ICH (intracerebral hemorrhage) ([place]) 03/30/2016  . Visit for preventive health examination 05/21/2015  . Breast cancer screening 05/21/2015  . Arm lesion 07/02/2014  . Pain in limb 01/13/2014  . Neuropathic pain of both legs 01/13/2014  . Hip pain 01/12/2014  . Allergic rhinitis 03/10/2013  . Back pain 03/08/2013  . Depression 12/18/2012  . PVD (peripheral vascular disease) ([place]) 01/21/2012  . Hyperlipidemia 12/06/2010  . Overweight(278.02) 12/06/2010  . RESTLESS LEG SYNDROME 10/25/2010  . Migraine without aura 10/07/2010  . Hereditary and idiopathic peripheral neuropathy 10/07/2010  . Essential hypertension, benign 10/07/2010  .  DISTURBANCE OF SKIN SENSATION 10/07/2010  . HEART MURMUR, HX OF 10/07/2010  . NEPHROLITHIASIS, HX OF 10/07/2010   Past Medical History:  Diagnosis Date  . Abscess of great toe, right   . Acute on chronic diastolic CHF (congestive heart failure) ([place]) 06/24/2018  . Acute osteomyelitis of toe, left ([place]) 09/05/2016  . Amputation of right great toe ([place]) 12/22/2018  . Anxiety   . Cataract    left - surgery to remove  . Cellulitis of foot, right 12/11/2018  . COMMON MIGRAINE 10/07/2010  . Coronary artery disease   . Decreased visual acuity 11/10/2016  . Depression 12/18/2012  . Diabetes mellitus type II, uncontrolled ([place]) 10/07/2010   Qualifier: Diagnosis of  By: Charlett Blake MD, [name]    . Diabetic foot infection ([place]) 08/26/2016  . Diabetic infection of right foot ([place])   . Disturbance of skin sensation 10/07/2010  . Gangrene of right foot ([place])   . GERD (gastroesophageal reflux disease)   . Heart murmur   . History of kidney stones    "years ago" - passed stones  . Hyperlipidemia 12/06/2010  . Hypertension   . Hypothyroidism   . Lipoma of abdominal wall 10/05/2016  . Overweight(278.02) 12/06/2010  .  PERIPHERAL NEUROPATHY, FEET 10/07/2010   11/17/2019  . PVD (peripheral vascular disease) ([place]) 01/21/2012  . RESTLESS LEG SYNDROME 10/25/2010  . Stroke [facility]) 2014, 2017   most recently in 2/17 - intracerebral hemorrhage    Family History  Problem Relation Age of Onset  . Arthritis Mother   . Stroke Brother        previous smoker  . Alcohol abuse Brother        in remission  . Leukemia Brother   . Diabetes Paternal Grandmother   . Healthy Son     Past Surgical History:  Procedure Laterality Date  . AMPUTATION Right 12/13/2018   Procedure: AMPUTATION RIGHT GREAT TOE, LOCAL RELOCATION OF TISSUE FOR WOUND CLOSURE 9cm x 3cm, VAC APPLICATION;  Surgeon: [name], MD;  Location: [facility];  Service: Orthopedics;  Laterality: Right;  . AMPUTATION Right 12/31/2018   Procedure: RIGHT BELOW KNEE AMPUTATION;  Surgeon: [name], MD;  Location: [place];  Service: Orthopedics;  Laterality: Right;  . AMPUTATION Left 09/21/2019   Procedure: LEFT BELOW KNEE AMPUTATION;  Surgeon: [name], MD;  Location: [place];  Service: Orthopedics;  Laterality: Left;  . AMPUTATION TOE Left 09/09/2016   Procedure: AMPUTATION OF LEFT GREAT TOE;  Surgeon: Milly Jakob, MD;  Location: [place];  Service: Orthopedics;  Laterality: Left;  . BELOW KNEE LEG AMPUTATION Left 09/21/2019  . CARDIAC CATHETERIZATION  06/24/2018  . CORONARY ARTERY BYPASS GRAFT N/A 06/28/2018   Procedure: CORONARY ARTERY BYPASS GRAFTING (CABG) times  four, using left internal mammary artery, endoscopically harvested right saphenous vein, and harvested left radial artery;  Surgeon: [name], MD;  Location: [place];  Service: Open Heart Surgery;  Laterality: N/A;  . ENDOVEIN HARVEST OF GREATER SAPHENOUS VEIN Right 06/28/2018   Procedure: ENDOVEIN HARVEST OF GREATER SAPHENOUS VEIN;  Surgeon: [name], MD;  Location: [place];  Service: Open Heart Surgery;  Laterality: Right;  . EYE SURGERY Left 06/2017   [facility]   . LEFT HEART CATH AND CORONARY ANGIOGRAPHY N/A 06/24/2018   Procedure: LEFT HEART CATH AND CORONARY ANGIOGRAPHY;  Surgeon: Troy Sine, MD;  Location: [place] [facility];  Service: Cardiovascular;  Laterality: N/A;  . RADIAL ARTERY HARVEST Left 06/28/2018  Procedure: RADIAL ARTERY HARVEST;  Surgeon: [name], MD;  Location: [place];  Service: Open Heart Surgery;  Laterality: Left;  . STUMP REVISION Left 11/18/2019   Procedure: REVISION LEFT BELOW KNEE AMPUTATION;  Surgeon: [name], MD;  Location: [facility];  Service: Orthopedics;  Laterality: Left;  . TEE WITHOUT CARDIOVERSION N/A 06/28/2018   Procedure: TRANSESOPHAGEAL ECHOCARDIOGRAM (TEE);  Surgeon: [name], MD;  Location: [place];  Service: Open Heart Surgery;  Laterality: N/A;  . WISDOM TOOTH EXTRACTION     Social History   Occupational History  . Occupation: Glass blower/designer  Tobacco Use  . Smoking status: Former Smoker    Packs/day: 0.50    Quit date: 09/08/2005    Years since quitting: 14.5  . Smokeless tobacco: Never Used  . Tobacco comment: " 1 smoke a week in the past"  Vaping Use  . Vaping Use: Never used  Substance and Sexual Activity  . Alcohol use: No    Alcohol/week: 0.0 standard drinks  . Drug use: Not Currently  . Sexual activity: Yes    Partners: Male    Birth control/protection: Post-menopausal

## 2020-03-22 ENCOUNTER — Other Ambulatory Visit: Payer: Self-pay | Admitting: Medical

## 2020-03-26 ENCOUNTER — Other Ambulatory Visit: Payer: Self-pay | Admitting: Medical

## 2020-03-30 ENCOUNTER — Telehealth (INDEPENDENT_AMBULATORY_CARE_PROVIDER_SITE_OTHER): Payer: BC Managed Care – PPO | Admitting: Family Medicine

## 2020-03-30 ENCOUNTER — Other Ambulatory Visit: Payer: Self-pay

## 2020-03-30 DIAGNOSIS — E039 Hypothyroidism, unspecified: Secondary | ICD-10-CM | POA: Diagnosis not present

## 2020-03-30 DIAGNOSIS — E1159 Type 2 diabetes mellitus with other circulatory complications: Secondary | ICD-10-CM

## 2020-03-30 DIAGNOSIS — B372 Candidiasis of skin and nail: Secondary | ICD-10-CM

## 2020-03-30 DIAGNOSIS — I10 Essential (primary) hypertension: Secondary | ICD-10-CM | POA: Diagnosis not present

## 2020-03-30 DIAGNOSIS — E785 Hyperlipidemia, unspecified: Secondary | ICD-10-CM

## 2020-03-30 DIAGNOSIS — Z89511 Acquired absence of right leg below knee: Secondary | ICD-10-CM

## 2020-03-30 DIAGNOSIS — I5032 Chronic diastolic (congestive) heart failure: Secondary | ICD-10-CM

## 2020-03-30 DIAGNOSIS — N183 Chronic kidney disease, stage 3 unspecified: Secondary | ICD-10-CM

## 2020-03-30 MED ORDER — NYSTATIN 100000 UNIT/GM EX CREA: 1.0000 "application " | TOPICAL_CREAM | Freq: Two times a day (BID) | CUTANEOUS | 2 refills | Status: DC

## 2020-03-30 NOTE — Assessment & Plan Note (Signed)
No recent exacerbation, no change in meds 

## 2020-03-30 NOTE — Assessment & Plan Note (Signed)
hgba1c acceptable, minimize simple carbs. Increase exercise as tolerated. Continue current meds 

## 2020-03-30 NOTE — Assessment & Plan Note (Signed)
On Levothyroxine, continue to monitor 

## 2020-03-30 NOTE — Assessment & Plan Note (Signed)
Monitor and report any concerns, no changes to meds. Encouraged heart healthy diet such as the DASH diet and exercise as tolerated.  ?

## 2020-03-30 NOTE — Assessment & Plan Note (Signed)
Tolerating statin, encouraged heart healthy diet, avoid trans fats, minimize simple carbs and saturated fats. Increase exercise as tolerated 

## 2020-03-30 NOTE — Assessment & Plan Note (Signed)
Hydrate and monitor 

## 2020-03-30 NOTE — Progress Notes (Signed)
Virtual Visit via Phone Note  I connected with [name] on 04/01/20 at 10:00 AM EDT by a phone enabled telemedicine application and verified that I am speaking with the correct person using two identifiers.  Location: Patient: home, patient and provider in office  Provider: home   I discussed the limitations of evaluation and management by telemedicine and the availability of in person appointments. The patient expressed understanding and agreed to proceed. Kem Boroughs, CMA was able to get the patient set up on a phone visit after being unable to obtain a video visit.      Subjective:    [name], female    DOB: 1966-01-21, 54 y.o.   MRN: [id number]  Chief Complaint  Patient presents with  . Follow-up  . rash under abdomen    HPI Patient is in today for follow up on chronic medical concerns. No polyuria or polydipsia. She is noting an itchy flaky rash on her lower abdominal wall. No pain or redness. She is looking forward to her right lower extremity prosthesis. She is tolerating her left leg prosthesis. No recent febrile illness or hospitalizations. No polyuria or polydipsia. She reports her blood sugars are improving since she gave up all candy, sweets and sweet drinks. Denies CP/palp/SOB/HA/congestion/fevers/GI or GU c/o. Taking meds as prescribed  Past Medical History:  Diagnosis Date  . Abscess of great toe, right   . Acute on chronic diastolic CHF (congestive heart failure) ([place]) 06/24/2018  . Acute osteomyelitis of toe, left ([place]) 09/05/2016  . Amputation of right great toe ([place]) 12/22/2018  . Anxiety   . Cataract    left - surgery to remove  . Cellulitis of foot, right 12/11/2018  . COMMON MIGRAINE 10/07/2010  . Coronary artery disease   . Decreased visual acuity 11/10/2016  . Depression 12/18/2012  . Diabetes mellitus type II, uncontrolled ([place]) 10/07/2010   Qualifier: Diagnosis of  By: Charlett Blake MD, [name]    . Diabetic foot infection ([place])  08/26/2016  . Diabetic infection of right foot ([place])   . Disturbance of skin sensation 10/07/2010  . Gangrene of right foot ([place])   . GERD (gastroesophageal reflux disease)   . Heart murmur   . History of kidney stones    "years ago" - passed stones  . Hyperlipidemia 12/06/2010  . Hypertension   . Hypothyroidism   . Lipoma of abdominal wall 10/05/2016  . Overweight(278.02) 12/06/2010  . PERIPHERAL NEUROPATHY, FEET 10/07/2010   11/17/2019  . PVD (peripheral vascular disease) ([place]) 01/21/2012  . RESTLESS LEG SYNDROME 10/25/2010  . Stroke [facility]) 2014, 2017   most recently in 2/17 - intracerebral hemorrhage    Past Surgical History:  Procedure Laterality Date  . AMPUTATION Right 12/13/2018   Procedure: AMPUTATION RIGHT GREAT TOE, LOCAL RELOCATION OF TISSUE FOR WOUND CLOSURE 9cm x 3cm, VAC APPLICATION;  Surgeon: [name], MD;  Location: [place];  Service: Orthopedics;  Laterality: Right;  . AMPUTATION Right 12/31/2018   Procedure: RIGHT BELOW KNEE AMPUTATION;  Surgeon: [name], MD;  Location: [place];  Service: Orthopedics;  Laterality: Right;  . AMPUTATION Left 09/21/2019   Procedure: LEFT BELOW KNEE AMPUTATION;  Surgeon: [name], MD;  Location: [place];  Service: Orthopedics;  Laterality: Left;  . AMPUTATION TOE Left 09/09/2016   Procedure: AMPUTATION OF LEFT GREAT TOE;  Surgeon: Milly Jakob, MD;  Location: [employer];  Service: Orthopedics;  Laterality: Left;  . BELOW KNEE LEG AMPUTATION Left  09/21/2019  . CARDIAC CATHETERIZATION  06/24/2018  . CORONARY ARTERY BYPASS GRAFT N/A 06/28/2018   Procedure: CORONARY ARTERY BYPASS GRAFTING (CABG) times  four, using left internal mammary artery, endoscopically harvested right saphenous vein, and harvested left radial artery;  Surgeon: [name], MD;  Location: [place];  Service: Open Heart Surgery;  Laterality: N/A;  . ENDOVEIN HARVEST OF GREATER SAPHENOUS VEIN Right 06/28/2018   Procedure: ENDOVEIN HARVEST OF  GREATER SAPHENOUS VEIN;  Surgeon: [name], MD;  Location: [place];  Service: Open Heart Surgery;  Laterality: Right;  . EYE SURGERY Left 06/2017   [facility]  . LEFT HEART CATH AND CORONARY ANGIOGRAPHY N/A 06/24/2018   Procedure: LEFT HEART CATH AND CORONARY ANGIOGRAPHY;  Surgeon: Troy Sine, MD;  Location: [place] [facility];  Service: Cardiovascular;  Laterality: N/A;  . RADIAL ARTERY HARVEST Left 06/28/2018   Procedure: RADIAL ARTERY HARVEST;  Surgeon: [name], MD;  Location: [place];  Service: Open Heart Surgery;  Laterality: Left;  . STUMP REVISION Left 11/18/2019   Procedure: REVISION LEFT BELOW KNEE AMPUTATION;  Surgeon: [name], MD;  Location: [place];  Service: Orthopedics;  Laterality: Left;  . TEE WITHOUT CARDIOVERSION N/A 06/28/2018   Procedure: TRANSESOPHAGEAL ECHOCARDIOGRAM (TEE);  Surgeon: [name], MD;  Location: [place];  Service: Open Heart Surgery;  Laterality: N/A;  . WISDOM TOOTH EXTRACTION      Family History  Problem Relation Age of Onset  . Arthritis Mother   . Stroke Brother        previous smoker  . Alcohol abuse Brother        in remission  . Leukemia Brother   . Diabetes Paternal Grandmother   . Healthy Son     Social History   Socioeconomic History  . Marital status: Single    Spouse name: Not on file  . Number of children: Not on file  . Years of education: Not on file  . Highest education level: Not on file  Occupational History  . Occupation: Glass blower/designer  Tobacco Use  . Smoking status: Former Smoker    Packs/day: 0.50    Quit date: 09/08/2005    Years since quitting: 14.5  . Smokeless tobacco: Never Used  . Tobacco comment: " 1 smoke a week in the past"  Vaping Use  . Vaping Use: Never used  Substance and Sexual Activity  . Alcohol use: No    Alcohol/week: 0.0 standard drinks  . Drug use: Not Currently  . Sexual activity: Yes    Partners: Male    Birth control/protection: Post-menopausal    Other Topics Concern  . Not on file  Social History Narrative   Lives with husband in a two story home.  Has one child.     Works as a Glass blower/designer.     Education: high school.   Social Determinants of Health   Financial Resource Strain:   . Difficulty of Paying Living Expenses:   Food Insecurity:   . Worried About Charity fundraiser in the Last Year:   . Arboriculturist in the Last Year:   Transportation Needs:   . Film/video editor (Medical):   Marland Kitchen Lack of Transportation (Non-Medical):   Physical Activity:   . Days of Exercise per Week:   . Minutes of Exercise per Session:   Stress:   . Feeling of Stress :   Social Connections:   . Frequency of Communication with Friends  and Family:   . Frequency of Social Gatherings with Friends and Family:   . Attends Religious Services:   . Active Member of Clubs or Organizations:   . Attends Archivist Meetings:   Marland Kitchen Marital Status:   Intimate Partner Violence:   . Fear of Current or Ex-Partner:   . Emotionally Abused:   Marland Kitchen Physically Abused:   . Sexually Abused:     Outpatient Medications Prior to Visit  Medication Sig Dispense Refill  . acetaminophen (TYLENOL) 325 MG tablet Take 2 tablets (650 mg total) by mouth every 6 (six) hours as needed for mild pain or headache.    Marland Kitchen amLODipine (NORVASC) 10 MG tablet TAKE 1 TABLET BY MOUTH EVERY DAY 90 tablet 1  . atorvastatin (LIPITOR) 80 MG tablet Take 1 tablet (80 mg total) by mouth daily at 6 PM. 30 tablet 1  . azelastine (ASTELIN) 0.1 % nasal spray Place 2 sprays into both nostrils 2 (two) times daily. Use in each nostril as directed 30 mL 3  . blood glucose meter kit and supplies Dispense based on patient and insurance preference. Use up to four times daily as directed. DX E11.59 1 each 11  . docusate sodium (COLACE) 100 MG capsule Take 1 capsule (100 mg total) by mouth 2 (two) times daily. 10 capsule 0  . escitalopram (LEXAPRO) 10 MG tablet Take 1 tablet (10 mg total)  by mouth daily. 90 tablet 1  . ferrous sulfate 325 (65 FE) MG EC tablet TAKE 1 TABLET BY MOUTH EVERY DAY WITH BREAKFAST 90 tablet 1  . insulin aspart (NOVOLOG FLEXPEN) 100 UNIT/ML FlexPen Inject 4 Units into the skin 3 (three) times daily with meals. 15 mL 5  . insulin glargine (LANTUS) 100 UNIT/ML Solostar Pen 27 units SQ qhs and then increase by 2 units every 3 days if no sugars below 150 15 mL 3  . Insulin Pen Needle (PEN NEEDLES 31GX5/16") 31G X 8 MM MISC Use as directed with Lantus and Novolog pen 100 each 5  . levothyroxine (SYNTHROID) 25 MCG tablet TAKE 1 TABLET (25 MCG TOTAL) BY MOUTH DAILY BEFORE BREAKFAST. (Patient taking differently: Take 25 mcg by mouth daily before breakfast. ) 90 tablet 1  . losartan (COZAAR) 25 MG tablet TAKE 1 TABLET (25 MG TOTAL) BY MOUTH DAILY 90 tablet 1  . metFORMIN (GLUCOPHAGE) 1000 MG tablet Take 1 tablet (1,000 mg total) by mouth 2 (two) times daily with a meal. 180 tablet 3  . methocarbamol (ROBAXIN) 500 MG tablet Take 1 tablet (500 mg total) by mouth every 6 (six) hours as needed for muscle spasms. 60 tablet 0  . metoCLOPramide (REGLAN) 5 MG tablet Take 1 tablet (5 mg total) by mouth 4 (four) times daily -  before meals and at bedtime. 90 tablet 0  . Multiple Vitamin (MULTIVITAMIN WITH MINERALS) TABS tablet Take 1 tablet by mouth daily.    Marland Kitchen oxyCODONE (OXY IR/ROXICODONE) 5 MG immediate release tablet Take 1 tablet (5 mg total) by mouth every 4 (four) hours as needed for moderate pain (pain score 4-6). 30 tablet 0  . oxyCODONE-acetaminophen (PERCOCET) 5-325 MG tablet Take 1 tablet by mouth every 8 (eight) hours as needed for severe pain. 20 tablet 0  . pantoprazole (PROTONIX) 40 MG tablet Take 1 tablet (40 mg total) by mouth daily. 30 tablet 0  . polyethylene glycol (MIRALAX / GLYCOLAX) 17 g packet Take 17 g by mouth daily. 14 each 0   No facility-administered medications prior to  visit.    Allergies  Allergen Reactions  . Lyrica [Pregabalin] Other (See  Comments)    MADE PATIENT VERY EMOTIONAL AND WOULD CRY EASILY  . Morphine And Related Nausea And Vomiting  . Penicillins Other (See Comments)    "Made patient feel sick" Did it involve swelling of the face/tongue/throat, SOB, or low BP? No Did it involve sudden or severe rash/hives, skin peeling, or any reaction on the inside of your mouth or nose? No Did you need to seek medical attention at a hospital or doctor's office? No When did it last happen?April 2020 If all above answers are "NO", may proceed with cephalosporin use.   . Tramadol Nausea And Vomiting    Pt cant tolerate this pain med.     Review of Systems  Constitutional: Positive for malaise/fatigue. Negative for fever.  HENT: Negative for congestion.   Eyes: Negative for blurred vision.  Respiratory: Negative for shortness of breath.   Cardiovascular: Negative for chest pain, palpitations and leg swelling.  Gastrointestinal: Negative for abdominal pain, blood in stool and nausea.  Genitourinary: Negative for dysuria and frequency.  Musculoskeletal: Positive for joint pain and myalgias. Negative for falls.  Skin: Positive for itching and rash.  Neurological: Negative for dizziness, loss of consciousness and headaches.  Endo/Heme/Allergies: Negative for environmental allergies.  Psychiatric/Behavioral: Negative for depression. The patient is not nervous/anxious.         Objective:    Physical Exam unable to obtain via phone visit  LMP 02/12/2011 (Exact Date)  Wt Readings from Last 3 Encounters:  03/15/20 140 lb (63.5 kg)  01/04/20 140 lb (63.5 kg)  12/20/19 140 lb (63.5 kg)    Diabetic Foot Exam - Simple   No data filed     Lab Results  Component Value Date   WBC 9.5 12/30/2019   HGB 11.5 (L) 12/30/2019   HCT 35.2 (L) 12/30/2019   PLT 496.0 (H) 12/30/2019   GLUCOSE 198 (H) 11/25/2019   CHOL 237 (H) 11/25/2019   TRIG 150.0 (H) 11/25/2019   HDL 47.40 11/25/2019   LDLDIRECT 133.0 03/02/2017    LDLCALC 159 (H) 11/25/2019   ALT 11 11/25/2019   AST 14 11/25/2019   NA 134 (L) 11/25/2019   K 4.3 11/25/2019   CL 96 11/25/2019   CREATININE 0.89 11/25/2019   BUN 17 11/25/2019   CO2 31 11/25/2019   TSH 3.396 07/02/2018   INR 1.30 06/28/2018   HGBA1C 13.0 (H) 11/25/2019   MICROALBUR 174.3 (H) 08/14/2017    Lab Results  Component Value Date   TSH 3.396 07/02/2018   Lab Results  Component Value Date   WBC 9.5 12/30/2019   HGB 11.5 (L) 12/30/2019   HCT 35.2 (L) 12/30/2019   MCV 85.1 12/30/2019   PLT 496.0 (H) 12/30/2019   Lab Results  Component Value Date   NA 134 (L) 11/25/2019   K 4.3 11/25/2019   CO2 31 11/25/2019   GLUCOSE 198 (H) 11/25/2019   BUN 17 11/25/2019   CREATININE 0.89 11/25/2019   BILITOT 0.3 11/25/2019   ALKPHOS 119 (H) 11/25/2019   AST 14 11/25/2019   ALT 11 11/25/2019   PROT 6.1 11/25/2019   ALBUMIN 2.8 (L) 11/25/2019   CALCIUM 8.6 11/25/2019   ANIONGAP 12 10/11/2019   GFR 65.62 11/25/2019   Lab Results  Component Value Date   CHOL 237 (H) 11/25/2019   Lab Results  Component Value Date   HDL 47.40 11/25/2019   Lab Results  Component Value  Date   LDLCALC 159 (H) 11/25/2019   Lab Results  Component Value Date   TRIG 150.0 (H) 11/25/2019   Lab Results  Component Value Date   CHOLHDL 5 11/25/2019   Lab Results  Component Value Date   HGBA1C 13.0 (H) 11/25/2019       Assessment & Plan:   Problem List Items Addressed This Visit    Essential hypertension, benign    Monitor and report any concerns, no changes to meds. Encouraged heart healthy diet such as the DASH diet and exercise as tolerated.       Hyperlipidemia    Tolerating statin, encouraged heart healthy diet, avoid trans fats, minimize simple carbs and saturated fats. Increase exercise as tolerated      Type 2 diabetes mellitus with vascular disease (HCC)    hgba1c acceptable, minimize simple carbs. Increase exercise as tolerated. Continue current meds      CKD  (chronic kidney disease), stage III    Hydrate and monitor      Hypothyroidism    On Levothyroxine, continue to monitor      Chronic diastolic congestive heart failure (HCC)    No recent exacerbation, no change in meds      Hx of right BKA ([place])    She is awaiting her prosthesis for her right leg, she is tolerating the prosthesis of the left leg.       Candidal skin infection    On abdomen. Given a prescription for Nystatin cream. Report if no improvement.       Relevant Medications   nystatin cream (MYCOSTATIN)      I am having Tekila Caillouet. Kurkowski start on nystatin cream. I am also having her maintain her acetaminophen, docusate sodium, levothyroxine, metoCLOPramide, multivitamin with minerals, atorvastatin, methocarbamol, polyethylene glycol, pantoprazole, oxyCODONE, escitalopram, metFORMIN, blood glucose meter kit and supplies, PEN NEEDLES 31GX5/16", azelastine, NovoLOG FlexPen, amLODipine, insulin glargine, oxyCODONE-acetaminophen, losartan, and ferrous sulfate.  Meds ordered this encounter  Medications  . nystatin cream (MYCOSTATIN)    Sig: Apply 1 application topically 2 (two) times daily.    Dispense:  30 g    Refill:  2   I discussed the assessment and treatment plan with the patient. The patient was provided an opportunity to ask questions and all were answered. The patient agreed with the plan and demonstrated an understanding of the instructions.   The patient was advised to call back or seek an in-person evaluation if the symptoms worsen or if the condition fails to improve as anticipated.  I provided 25 minutes of non-face-to-face time during this encounter.   Penni Homans, MD

## 2020-04-01 DIAGNOSIS — B372 Candidiasis of skin and nail: Secondary | ICD-10-CM | POA: Insufficient documentation

## 2020-04-01 NOTE — Assessment & Plan Note (Signed)
On abdomen. Given a prescription for Nystatin cream. Report if no improvement.

## 2020-04-01 NOTE — Assessment & Plan Note (Signed)
She is awaiting her prosthesis for her right leg, she is tolerating the prosthesis of the left leg.

## 2020-04-10 ENCOUNTER — Ambulatory Visit: Payer: BC Managed Care – PPO | Admitting: Orthopedic Surgery

## 2020-04-11 ENCOUNTER — Ambulatory Visit (INDEPENDENT_AMBULATORY_CARE_PROVIDER_SITE_OTHER): Payer: BC Managed Care – PPO | Admitting: Family

## 2020-04-11 ENCOUNTER — Encounter: Payer: Self-pay | Admitting: Family

## 2020-04-11 VITALS — Ht 62.0 in | Wt 140.0 lb

## 2020-04-11 DIAGNOSIS — Z89512 Acquired absence of left leg below knee: Secondary | ICD-10-CM | POA: Diagnosis not present

## 2020-04-11 DIAGNOSIS — Z89511 Acquired absence of right leg below knee: Secondary | ICD-10-CM

## 2020-04-11 NOTE — Progress Notes (Signed)
Office Visit Note   Patient: [name]           Date of Birth: 12/18/1965           MRN: [id number] Visit Date: 04/11/2020              Requested by: Mosie Lukes, MD [address] [place],  [place] [address] PCP: Mosie Lukes, MD  Chief Complaint  Patient presents with  . Left Leg - Follow-up    11/18/19 left BKA       HPI: The patient is a 54 year old woman seen today in follow-up status post left below-knee amputation.  She is also status post right below-knee amputation.  She does have her prosthesis on the right.    She was slow to heal her left below-knee amputation surgery was March of this year.    States that she has an upcoming appointment with [facility] for prosthesis set up.    Overall has no concerns she does have some discoloration and superficial callus of the right tibial tubercle from an bearing no impending skin breakdown she is currently wearing her shrinker beneath her liner.      Assessment & Plan: Visit Diagnoses:  1. Acquired absence of right leg below knee ([place])   2. Acquired absence of left leg below knee [facility])     Plan: Patient will follow up in office as needed.  Again reinforced to wear the liner on the right without a shrinker beneath.  Follow-Up Instructions: Return if symptoms worsen or fail to improve.   Ortho Exam  Patient is alert, oriented, no adenopathy, well-dressed, normal affect, normal respiratory effort. Bilateral below-knee amputations are well-healed well consolidated there is no erythema no skin breakdown no wounds no impending breakdown   Imaging: No results found. No images are attached to the encounter.  Labs: Lab Results  Component Value Date   HGBA1C 13.0 (H) 11/25/2019   HGBA1C 14.1 (H) 09/21/2019   HGBA1C 10.5 (H) 12/12/2018   ESRSEDRATE 130 (H) 12/12/2018   ESRSEDRATE 115 (H) 06/24/2018   ESRSEDRATE 107 (H) 09/04/2016   CRP 28.2 (H) 12/12/2018   CRP 7.2 (H) 09/04/2016   CRP 0.7  03/21/2015   LABURIC 3.2 06/13/2014   LABURIC 3.1 02/09/2014   REPTSTATUS 10/08/2019 FINAL 10/06/2019   GRAMSTAIN  12/13/2018    FEW WBC PRESENT,BOTH PMN AND MONONUCLEAR NO ORGANISMS SEEN    CULT >=100,000 COLONIES/mL CITROBACTER FREUNDII (A) 10/06/2019   LABORGA CITROBACTER FREUNDII (A) 10/06/2019     Lab Results  Component Value Date   ALBUMIN 2.8 (L) 11/25/2019   ALBUMIN 2.2 (L) 10/11/2019   ALBUMIN 2.1 (L) 09/28/2019   PREALBUMIN 12.7 (L) 09/04/2016   LABURIC 3.2 06/13/2014   LABURIC 3.1 02/09/2014    Lab Results  Component Value Date   MG 2.1 12/16/2018   MG 2.8 (H) 06/29/2018   MG 3.0 (H) 06/29/2018   No results found for: VD25OH  Lab Results  Component Value Date   PREALBUMIN 12.7 (L) 09/04/2016   CBC EXTENDED Latest Ref Rng & Units 12/30/2019 11/25/2019 10/11/2019  WBC 4.0 - 10.5 K/uL 9.5 10.2 8.3  RBC 3.87 - 5.11 Mil/uL 4.14 3.66(L) 3.52(L)  HGB 12.0 - 15.0 g/dL 11.5(L) 10.0(L) 9.5(L)  HCT 36 - 46 % 35.2(L) 30.8(L) 29.8(L)  PLT 150 - 400 K/uL 496.0(H) 501.0(H) 654(H)  NEUTROABS 1.4 - 7.7 K/uL 6.7 7.7 4.5  LYMPHSABS 0.7 - 4.0 K/uL 1.9 1.6 2.5  Body mass index is 25.61 kg/m.  Orders:  No orders of the defined types were placed in this encounter.  No orders of the defined types were placed in this encounter.    Procedures: No procedures performed  Clinical Data: No additional findings.  ROS:  All other systems negative, except as noted in the HPI. Review of Systems  Constitutional: Negative for chills and fever.  Musculoskeletal: Negative for myalgias.  Skin: Negative for wound.    Objective: Vital Signs: Ht 5\' 2"  (1.575 m)   Wt 140 lb (63.5 kg)   LMP 02/12/2011 (Exact Date)   BMI 25.61 kg/m   Specialty Comments:  No specialty comments available.  PMFS History: Patient Active Problem List   Diagnosis Date Noted  . Candidal skin infection 04/01/2020  . Polyradiculoneuropathy ([place]) 01/23/2020  . Sun-damaged skin 01/23/2020  .  Uncontrolled type 2 diabetes mellitus with hyperglycemia ([place]) 11/25/2019  . Wound infection after surgery 11/25/2019  . Adjustment disorder with mixed anxiety and depressed mood   . S/P bilateral BKA (below knee amputation) ([place]) 09/27/2019  . Acute osteomyelitis of left foot ([place]) 09/21/2019  . Subacute osteomyelitis, left ankle and foot ([place])   . Hx of right BKA ([place]) 02/09/2019  . Diabetes mellitus type 2 in nonobese (HCC)   . Labile blood pressure   . Labile blood glucose   . Phantom limb pain ([place])   . CKD (chronic kidney disease), stage III   . Hypothyroidism   . Chronic diastolic congestive heart failure ([place])   . Coronary artery disease involving coronary bypass graft of native heart without angina pectoris   . Poorly controlled type 2 diabetes mellitus with peripheral neuropathy ([place])   . Acute on chronic anemia   . History of below knee amputation, right ([place]) 12/31/2018  . S/P CABG x 3 06/28/2018  . Coronary artery disease involving native coronary artery of native heart with angina pectoris ([place]) 06/25/2018  . Type 2 diabetes mellitus with vascular disease ([place]) 06/24/2018  . Acute on chronic diastolic CHF (congestive heart failure) ([place]) 06/24/2018  . Non-ST elevation (NSTEMI) myocardial infarction ([place]) 06/24/2018  . Chest pain 06/23/2018  . Glaucoma 09/17/2017  . Diabetic retinopathy ([place]) 09/17/2017  . Otitis media 11/10/2016  . Decreased visual acuity 11/10/2016  . Spinal stenosis, lumbar region with neurogenic claudication 10/22/2016  . Other spondylosis with radiculopathy, lumbar region 10/13/2016  . Lipoma of abdominal wall 10/05/2016  . Severe protein-calorie malnutrition ([place]) 09/04/2016  . Anemia of chronic disease 09/04/2016  . Diabetic polyneuropathy associated with type 2 diabetes mellitus ([place]) 05/07/2016  . Orthostatic hypotension 05/07/2016  . Cerebrovascular disease 04/27/2016  . N&V (nausea and vomiting) 04/01/2016  . ICH (intracerebral hemorrhage)  ([place]) 03/30/2016  . Visit for preventive health examination 05/21/2015  . Breast cancer screening 05/21/2015  . Arm lesion 07/02/2014  . Pain in limb 01/13/2014  . Neuropathic pain of both legs 01/13/2014  . Hip pain 01/12/2014  . Allergic rhinitis 03/10/2013  . Back pain 03/08/2013  . Depression 12/18/2012  . PVD (peripheral vascular disease) ([place]) 01/21/2012  . Hyperlipidemia 12/06/2010  . Overweight(278.02) 12/06/2010  . RESTLESS LEG SYNDROME 10/25/2010  . Migraine without aura 10/07/2010  . Hereditary and idiopathic peripheral neuropathy 10/07/2010  . Essential hypertension, benign 10/07/2010  . DISTURBANCE OF SKIN SENSATION 10/07/2010  . HEART MURMUR, HX OF 10/07/2010  . NEPHROLITHIASIS, HX OF 10/07/2010   Past Medical History:  Diagnosis Date  . Abscess of great toe, right   . Acute  on chronic diastolic CHF (congestive heart failure) ([place]) 06/24/2018  . Acute osteomyelitis of toe, left ([place]) 09/05/2016  . Amputation of right great toe ([place]) 12/22/2018  . Anxiety   . Cataract    left - surgery to remove  . Cellulitis of foot, right 12/11/2018  . COMMON MIGRAINE 10/07/2010  . Coronary artery disease   . Decreased visual acuity 11/10/2016  . Depression 12/18/2012  . Diabetes mellitus type II, uncontrolled ([place]) 10/07/2010   Qualifier: Diagnosis of  By: Charlett Blake MD, [name]    . Diabetic foot infection ([place]) 08/26/2016  . Diabetic infection of right foot ([place])   . Disturbance of skin sensation 10/07/2010  . Gangrene of right foot ([place])   . GERD (gastroesophageal reflux disease)   . Heart murmur   . History of kidney stones    "years ago" - passed stones  . Hyperlipidemia 12/06/2010  . Hypertension   . Hypothyroidism   . Lipoma of abdominal wall 10/05/2016  . Overweight(278.02) 12/06/2010  . PERIPHERAL NEUROPATHY, FEET 10/07/2010   11/17/2019  . PVD (peripheral vascular disease) ([place]) 01/21/2012  . RESTLESS LEG SYNDROME 10/25/2010  . Stroke [facility]) 2014, 2017   most recently in 2/17  - intracerebral hemorrhage    Family History  Problem Relation Age of Onset  . Arthritis Mother   . Stroke Brother        previous smoker  . Alcohol abuse Brother        in remission  . Leukemia Brother   . Diabetes Paternal Grandmother   . Healthy Son     Past Surgical History:  Procedure Laterality Date  . AMPUTATION Right 12/13/2018   Procedure: AMPUTATION RIGHT GREAT TOE, LOCAL RELOCATION OF TISSUE FOR WOUND CLOSURE 9cm x 3cm, VAC APPLICATION;  Surgeon: [name], MD;  Location: [place];  Service: Orthopedics;  Laterality: Right;  . AMPUTATION Right 12/31/2018   Procedure: RIGHT BELOW KNEE AMPUTATION;  Surgeon: [name], MD;  Location: [place];  Service: Orthopedics;  Laterality: Right;  . AMPUTATION Left 09/21/2019   Procedure: LEFT BELOW KNEE AMPUTATION;  Surgeon: [name], MD;  Location: [place];  Service: Orthopedics;  Laterality: Left;  . AMPUTATION TOE Left 09/09/2016   Procedure: AMPUTATION OF LEFT GREAT TOE;  Surgeon: Milly Jakob, MD;  Location: [place];  Service: Orthopedics;  Laterality: Left;  . BELOW KNEE LEG AMPUTATION Left 09/21/2019  . CARDIAC CATHETERIZATION  06/24/2018  . CORONARY ARTERY BYPASS GRAFT N/A 06/28/2018   Procedure: CORONARY ARTERY BYPASS GRAFTING (CABG) times  four, using left internal mammary artery, endoscopically harvested right saphenous vein, and harvested left radial artery;  Surgeon: [name], MD;  Location: [place];  Service: Open Heart Surgery;  Laterality: N/A;  . ENDOVEIN HARVEST OF GREATER SAPHENOUS VEIN Right 06/28/2018   Procedure: ENDOVEIN HARVEST OF GREATER SAPHENOUS VEIN;  Surgeon: [name], MD;  Location: [place];  Service: Open Heart Surgery;  Laterality: Right;  . EYE SURGERY Left 06/2017   [facility]  . LEFT HEART CATH AND CORONARY ANGIOGRAPHY N/A 06/24/2018   Procedure: LEFT HEART CATH AND CORONARY ANGIOGRAPHY;  Surgeon: Troy Sine, MD;  Location: [place] [facility];  Service:  Cardiovascular;  Laterality: N/A;  . RADIAL ARTERY HARVEST Left 06/28/2018   Procedure: RADIAL ARTERY HARVEST;  Surgeon: [name], MD;  Location: [place];  Service: Open Heart Surgery;  Laterality: Left;  . STUMP REVISION Left 11/18/2019   Procedure: REVISION LEFT BELOW KNEE  AMPUTATION;  Surgeon: [name], MD;  Location: [place];  Service: Orthopedics;  Laterality: Left;  . TEE WITHOUT CARDIOVERSION N/A 06/28/2018   Procedure: TRANSESOPHAGEAL ECHOCARDIOGRAM (TEE);  Surgeon: [name], MD;  Location: [place];  Service: Open Heart Surgery;  Laterality: N/A;  . WISDOM TOOTH EXTRACTION     Social History   Occupational History  . Occupation: Glass blower/designer  Tobacco Use  . Smoking status: Former Smoker    Packs/day: 0.50    Quit date: 09/08/2005    Years since quitting: 14.6  . Smokeless tobacco: Never Used  . Tobacco comment: " 1 smoke a week in the past"  Vaping Use  . Vaping Use: Never used  Substance and Sexual Activity  . Alcohol use: No    Alcohol/week: 0.0 standard drinks  . Drug use: Not Currently  . Sexual activity: Yes    Partners: Male    Birth control/protection: Post-menopausal

## 2020-04-13 ENCOUNTER — Telehealth: Payer: Self-pay | Admitting: Orthopedic Surgery

## 2020-04-13 ENCOUNTER — Other Ambulatory Visit: Payer: Self-pay | Admitting: Physician Assistant

## 2020-04-13 MED ORDER — HYDROCODONE-ACETAMINOPHEN 5-325 MG PO TABS: 1.0000 | ORAL_TABLET | Freq: Four times a day (QID) | ORAL | 0 refills | Status: DC | PRN

## 2020-04-13 NOTE — Telephone Encounter (Signed)
Pt would like to get a refill of her oxycodone please

## 2020-04-13 NOTE — Telephone Encounter (Signed)
I spoke with patient and called in Hydrocodone instead

## 2020-05-02 ENCOUNTER — Other Ambulatory Visit: Payer: Self-pay | Admitting: Family Medicine

## 2020-05-02 ENCOUNTER — Telehealth: Payer: Self-pay | Admitting: Family Medicine

## 2020-05-02 MED ORDER — FLUCONAZOLE 150 MG PO TABS
150.0000 mg | ORAL_TABLET | Freq: Once | ORAL | 1 refills | Status: AC
Start: 2020-05-02 — End: 2020-05-02

## 2020-05-02 NOTE — Telephone Encounter (Signed)
New message:   Pt is calling and wants to know if some different medication can be called in for her due to the cream not working on the rash in b/w her legs.

## 2020-05-02 NOTE — Telephone Encounter (Signed)
Have her continue the Nystatin and add a diflucan tab a week for next 4 to 8 weeks that should do it the day she takes the [place] do not take a statin

## 2020-05-03 NOTE — Telephone Encounter (Signed)
Patient calling in reference to diflucan . Was medication sent to pharmacy [facility] [place]

## 2020-05-03 NOTE — Telephone Encounter (Signed)
Left message on machine to call back  

## 2020-05-04 ENCOUNTER — Other Ambulatory Visit: Payer: Self-pay

## 2020-05-04 ENCOUNTER — Encounter: Payer: Self-pay | Admitting: Nurse Practitioner

## 2020-05-04 ENCOUNTER — Telehealth (INDEPENDENT_AMBULATORY_CARE_PROVIDER_SITE_OTHER): Payer: BC Managed Care – PPO | Admitting: Nurse Practitioner

## 2020-05-04 DIAGNOSIS — K529 Noninfective gastroenteritis and colitis, unspecified: Secondary | ICD-10-CM

## 2020-05-04 MED ORDER — FLUCONAZOLE 150 MG PO TABS
150.0000 mg | ORAL_TABLET | ORAL | 0 refills | Status: DC
Start: 2020-05-04 — End: 2020-12-25

## 2020-05-04 NOTE — Progress Notes (Signed)
Virtual Visit via Telephone Note  I connected with GLENDALE WHERRY on 05/04/20 at  2:00 PM EDT by telephone and verified that I am speaking with the correct person using two identifiers.  Location: Patient: home Provider: office Participants: patient and provider  I discussed the limitations, risks, security and privacy concerns of performing an evaluation and management service by telephone and the availability of in person appointments. I also discussed with the patient that there may be a patient responsible charge related to this service. The patient expressed understanding and agreed to proceed.  CC: History of Present Illness: Diarrhea  This is a new problem. The current episode started in the past 7 days. The problem occurs less than 2 times per day. The problem has been gradually improving. The stool consistency is described as watery. The patient states that diarrhea does not awaken her from sleep. Associated symptoms include abdominal pain, bloating, myalgias and vomiting. Pertinent negatives include no chills, coughing, fever, headaches, increased  flatus, sweats or URI. There are no known risk factors. She has tried analgesics and anti-motility drug for the symptoms. Her past medical history is significant for irritable bowel syndrome and malabsorption.  random glucose of 183 per patient Reports she is home with adult son and grandson   Observations/Objective: Alert and oriented x4 Unable to evaluate due to televisit.  Assessment and Plan: Diagnoses and all orders for this visit:  Gastroenteritis   Follow Up Instructions: Maintain adequate oral hydration follow Brat diet x2days, then advance as tolerated Hold metformin while symptoms last Go for COVID test Go to ED is symptoms do not improve in 24hrs.   I discussed the assessment and treatment plan with the patient. The patient was provided an opportunity to ask questions and all were answered. The patient agreed with  the plan and demonstrated an understanding of the instructions.   The patient was advised to call back or seek an in-person evaluation if the symptoms worsen or if the condition fails to improve as anticipated.  I provided 11 minutes of non-face-to-face time during this encounter.  Wilfred Lacy, NP

## 2020-05-04 NOTE — Telephone Encounter (Signed)
Pt. Notified and understands instructions per provider. [place]

## 2020-05-04 NOTE — Patient Instructions (Signed)
Maintain adequate oral hydration follow Brat diet x2days, then advance as tolerated Hold metformin while symptoms last Go for COVID test Go to ED is symptoms do not improve in 24hrs.

## 2020-05-04 NOTE — Telephone Encounter (Signed)
rx for diflucan sent in.

## 2020-05-08 ENCOUNTER — Ambulatory Visit: Payer: BC Managed Care – PPO | Admitting: Physician Assistant

## 2020-05-08 NOTE — Addendum Note (Signed)
Addended by: Kelle Darting A on: 05/08/2020 11:45 AM   Modules accepted: Orders

## 2020-05-09 ENCOUNTER — Other Ambulatory Visit: Payer: BC Managed Care – PPO

## 2020-05-15 ENCOUNTER — Ambulatory Visit: Payer: BC Managed Care – PPO | Admitting: Orthopedic Surgery

## 2020-05-17 ENCOUNTER — Other Ambulatory Visit: Payer: Self-pay | Admitting: Family Medicine

## 2020-05-17 DIAGNOSIS — F32A Depression, unspecified: Secondary | ICD-10-CM

## 2020-05-22 ENCOUNTER — Ambulatory Visit: Payer: BC Managed Care – PPO | Admitting: Medical

## 2020-05-24 ENCOUNTER — Ambulatory Visit: Payer: BC Managed Care – PPO | Admitting: Orthopedic Surgery

## 2020-05-30 ENCOUNTER — Ambulatory Visit: Payer: BC Managed Care – PPO | Admitting: Medical

## 2020-05-30 DIAGNOSIS — Z89512 Acquired absence of left leg below knee: Secondary | ICD-10-CM | POA: Diagnosis not present

## 2020-06-20 ENCOUNTER — Ambulatory Visit: Payer: BC Managed Care – PPO | Admitting: Internal Medicine

## 2020-06-26 ENCOUNTER — Ambulatory Visit: Payer: BC Managed Care – PPO | Admitting: Family

## 2020-07-04 ENCOUNTER — Ambulatory Visit (INDEPENDENT_AMBULATORY_CARE_PROVIDER_SITE_OTHER): Payer: BC Managed Care – PPO | Admitting: Family

## 2020-07-04 ENCOUNTER — Other Ambulatory Visit: Payer: Self-pay

## 2020-07-04 ENCOUNTER — Encounter: Payer: Self-pay | Admitting: Family

## 2020-07-04 VITALS — Ht 62.0 in | Wt 140.0 lb

## 2020-07-04 DIAGNOSIS — S88111A Complete traumatic amputation at level between knee and ankle, right lower leg, initial encounter: Secondary | ICD-10-CM

## 2020-07-04 DIAGNOSIS — Z89511 Acquired absence of right leg below knee: Secondary | ICD-10-CM | POA: Diagnosis not present

## 2020-07-04 MED ORDER — PRO-STAT 64 PO LIQD: 30.0000 mL | Freq: Three times a day (TID) | ORAL | 2 refills | Status: DC

## 2020-07-06 ENCOUNTER — Telehealth: Payer: BC Managed Care – PPO | Admitting: Family Medicine

## 2020-07-06 ENCOUNTER — Other Ambulatory Visit: Payer: Self-pay

## 2020-07-09 ENCOUNTER — Telehealth: Payer: Self-pay

## 2020-07-09 NOTE — Telephone Encounter (Signed)
I called pt and mail boxis full. I will hold and try again to find out what rx she is requesting.

## 2020-07-09 NOTE — Telephone Encounter (Signed)
Patient called in for refill on prescription . [facility] [place]

## 2020-07-09 NOTE — Telephone Encounter (Signed)
Called pt and she is requesting refill for Oxycodone last refilled 11/2019. Pt was seen in the office last week for a new wound to her BKA. Please advise.

## 2020-07-10 MED ORDER — OXYCODONE-ACETAMINOPHEN 5-325 MG PO TABS
1.0000 | ORAL_TABLET | Freq: Two times a day (BID) | ORAL | 0 refills | Status: DC | PRN
Start: 2020-07-10 — End: 2020-08-10

## 2020-07-10 NOTE — Progress Notes (Signed)
Office Visit Note   Patient: [name]           Date of Birth: February 13, 1966           MRN: [id number] Visit Date: 07/04/2020              Requested by: Mosie Lukes, MD [address] [place],  [place] [address] PCP: Mosie Lukes, MD  Chief Complaint  Patient presents with  . Right Leg - Pain      HPI: The patient is a 54 year old woman who presents today for evaluation of wound to the right residual limb. This has been on going for a little over a week.   She is accompanied by a friend who has been assisting with her care. She provides most of the history. Reports she has noticed a worsening odor and increasing drainage from the wound to the end of the residual limb. Concerned for infection.   [name] is using her prosthesis on the left to pivot and transfer. Today she is quite weak. Is worried she cannot make it out of car in to the office for exam.   She reports she has not been eating. She has not been interested in food. States has not eaten in a few days.   Assessment & Plan: Visit Diagnoses:  1. Below-knee amputation of right lower extremity with complication, initial encounter ([place])   2. Acquired absence of right leg below knee [facility])     Plan: discussed wound and care with patient and friend. Silver cell dressing applied today. Will resume silvadene dressings at home. Discussed importance of nutrition and protein supplementation. Will follow up in 2 weeks in office.   Follow-Up Instructions: Return in about 2 weeks (around 07/18/2020).   Ortho Exam  Patient is alert, oriented, no adenopathy, disheveled, and withdrawn, normal respiratory effort. On examination of the right residual limb there is a 6 cm in diameter ulceration. This is 2 mm deep. Wound filled in with moist pink tissue. No active drainage. There is no surrounding maceration. Foul odor noted, does not appear to be coming from wound. Suspect having a vaginal yeast infection.    Imaging: No results found.    Labs: Lab Results  Component Value Date   HGBA1C 13.0 (H) 11/25/2019   HGBA1C 14.1 (H) 09/21/2019   HGBA1C 10.5 (H) 12/12/2018   ESRSEDRATE 130 (H) 12/12/2018   ESRSEDRATE 115 (H) 06/24/2018   ESRSEDRATE 107 (H) 09/04/2016   CRP 28.2 (H) 12/12/2018   CRP 7.2 (H) 09/04/2016   CRP 0.7 03/21/2015   LABURIC 3.2 06/13/2014   LABURIC 3.1 02/09/2014   REPTSTATUS 10/08/2019 FINAL 10/06/2019   GRAMSTAIN  12/13/2018    FEW WBC PRESENT,BOTH PMN AND MONONUCLEAR NO ORGANISMS SEEN    CULT >=100,000 COLONIES/mL CITROBACTER FREUNDII (A) 10/06/2019   LABORGA CITROBACTER FREUNDII (A) 10/06/2019     Lab Results  Component Value Date   ALBUMIN 2.8 (L) 11/25/2019   ALBUMIN 2.2 (L) 10/11/2019   ALBUMIN 2.1 (L) 09/28/2019   PREALBUMIN 12.7 (L) 09/04/2016   LABURIC 3.2 06/13/2014   LABURIC 3.1 02/09/2014    Lab Results  Component Value Date   MG 2.1 12/16/2018   MG 2.8 (H) 06/29/2018   MG 3.0 (H) 06/29/2018   No results found for: VD25OH  Lab Results  Component Value Date   PREALBUMIN 12.7 (L) 09/04/2016   CBC EXTENDED Latest Ref Rng & Units 12/30/2019 11/25/2019 10/11/2019  WBC  4.0 - 10.5 K/uL 9.5 10.2 8.3  RBC 3.87 - 5.11 Mil/uL 4.14 3.66(L) 3.52(L)  HGB 12.0 - 15.0 g/dL 11.5(L) 10.0(L) 9.5(L)  HCT 36 - 46 % 35.2(L) 30.8(L) 29.8(L)  PLT 150 - 400 K/uL 496.0(H) 501.0(H) 654(H)  NEUTROABS 1.4 - 7.7 K/uL 6.7 7.7 4.5  LYMPHSABS 0.7 - 4.0 K/uL 1.9 1.6 2.5     Body mass index is 25.61 kg/m.  Orders:  No orders of the defined types were placed in this encounter.  Meds ordered this encounter  Medications  . Amino Acids-Protein Hydrolys (FEEDING SUPPLEMENT, PRO-STAT 64,) LIQD    Sig: Take 30 mLs by mouth 3 (three) times daily with meals.    Dispense:  2700 mL    Refill:  2     Procedures: No procedures performed  Clinical Data: No additional findings.  ROS:  All other systems negative, except as noted in the HPI. Review of Systems   Constitutional: Negative for chills and fever.  Cardiovascular: Negative for leg swelling.  Skin: Positive for color change and wound. Negative for rash.  Neurological: Positive for weakness.    Objective: Vital Signs: Ht 5\' 2"  (1.575 m)   Wt 140 lb (63.5 kg)   LMP 02/12/2011 (Exact Date)   BMI 25.61 kg/m   Specialty Comments:  No specialty comments available.  PMFS History: Patient Active Problem List   Diagnosis Date Noted  . Candidal skin infection 04/01/2020  . Polyradiculoneuropathy ([place]) 01/23/2020  . Sun-damaged skin 01/23/2020  . Uncontrolled type 2 diabetes mellitus with hyperglycemia ([place]) 11/25/2019  . Wound infection after surgery 11/25/2019  . Adjustment disorder with mixed anxiety and depressed mood   . S/P bilateral BKA (below knee amputation) ([place]) 09/27/2019  . Acute osteomyelitis of left foot ([place]) 09/21/2019  . Subacute osteomyelitis, left ankle and foot ([place])   . Hx of right BKA ([place]) 02/09/2019  . Diabetes mellitus type 2 in nonobese (H[facility])   . Labile blood pressure   . Labile blood glucose   . Phantom limb pain ([place])   . CKD (chronic kidney disease), stage III ([place])   . Hypothyroidism   . Chronic diastolic congestive heart failure ([place])   . Coronary artery disease involving coronary bypass graft of native heart without angina pectoris   . Poorly controlled type 2 diabetes mellitus with peripheral neuropathy ([place])   . Acute on chronic anemia   . History of below knee amputation, right ([place]) 12/31/2018  . S/P CABG x 3 06/28/2018  . Coronary artery disease involving native coronary artery of native heart with angina pectoris ([place]) 06/25/2018  . Type 2 diabetes mellitus with vascular disease ([place]) 06/24/2018  . Acute on chronic diastolic CHF (congestive heart failure) ([place]) 06/24/2018  . Non-ST elevation (NSTEMI) myocardial infarction ([place]) 06/24/2018  . Chest pain 06/23/2018  . Glaucoma 09/17/2017  . Diabetic retinopathy ([place]) 09/17/2017  . Otitis  media 11/10/2016  . Decreased visual acuity 11/10/2016  . Spinal stenosis, lumbar region with neurogenic claudication 10/22/2016  . Other spondylosis with radiculopathy, lumbar region 10/13/2016  . Lipoma of abdominal wall 10/05/2016  . Severe protein-calorie malnutrition ([place]) 09/04/2016  . Anemia of chronic disease 09/04/2016  . Diabetic polyneuropathy associated with type 2 diabetes mellitus ([place]) 05/07/2016  . Orthostatic hypotension 05/07/2016  . Cerebrovascular disease 04/27/2016  . N&V (nausea and vomiting) 04/01/2016  . ICH (intracerebral hemorrhage) ([place]) 03/30/2016  . Visit for preventive health examination 05/21/2015  . Breast cancer screening 05/21/2015  . Arm lesion 07/02/2014  .  Pain in limb 01/13/2014  . Neuropathic pain of both legs 01/13/2014  . Hip pain 01/12/2014  . Allergic rhinitis 03/10/2013  . Back pain 03/08/2013  . Depression 12/18/2012  . PVD (peripheral vascular disease) ([place]) 01/21/2012  . Hyperlipidemia 12/06/2010  . Overweight(278.02) 12/06/2010  . RESTLESS LEG SYNDROME 10/25/2010  . Migraine without aura 10/07/2010  . Hereditary and idiopathic peripheral neuropathy 10/07/2010  . Essential hypertension, benign 10/07/2010  . DISTURBANCE OF SKIN SENSATION 10/07/2010  . HEART MURMUR, HX OF 10/07/2010  . NEPHROLITHIASIS, HX OF 10/07/2010   Past Medical History:  Diagnosis Date  . Abscess of great toe, right   . Acute on chronic diastolic CHF (congestive heart failure) ([place]) 06/24/2018  . Acute osteomyelitis of toe, left ([place]) 09/05/2016  . Amputation of right great toe ([place]) 12/22/2018  . Anxiety   . Cataract    left - surgery to remove  . Cellulitis of foot, right 12/11/2018  . COMMON MIGRAINE 10/07/2010  . Coronary artery disease   . Decreased visual acuity 11/10/2016  . Depression 12/18/2012  . Diabetes mellitus type II, uncontrolled ([place]) 10/07/2010   Qualifier: Diagnosis of  By: Charlett Blake MD, [name]    . Diabetic foot infection ([place]) 08/26/2016  .  Diabetic infection of right foot ([place])   . Disturbance of skin sensation 10/07/2010  . Gangrene of right foot ([place])   . GERD (gastroesophageal reflux disease)   . Heart murmur   . History of kidney stones    "years ago" - passed stones  . Hyperlipidemia 12/06/2010  . Hypertension   . Hypothyroidism   . Lipoma of abdominal wall 10/05/2016  . Overweight(278.02) 12/06/2010  . PERIPHERAL NEUROPATHY, FEET 10/07/2010   11/17/2019  . PVD (peripheral vascular disease) ([place]) 01/21/2012  . RESTLESS LEG SYNDROME 10/25/2010  . Stroke [facility]) 2014, 2017   most recently in 2/17 - intracerebral hemorrhage    Family History  Problem Relation Age of Onset  . Arthritis Mother   . Stroke Brother        previous smoker  . Alcohol abuse Brother        in remission  . Leukemia Brother   . Diabetes Paternal Grandmother   . Healthy Son     Past Surgical History:  Procedure Laterality Date  . AMPUTATION Right 12/13/2018   Procedure: AMPUTATION RIGHT GREAT TOE, LOCAL RELOCATION OF TISSUE FOR WOUND CLOSURE 9cm x 3cm, VAC APPLICATION;  Surgeon: [name], MD;  Location: [facility];  Service: Orthopedics;  Laterality: Right;  . AMPUTATION Right 12/31/2018   Procedure: RIGHT BELOW KNEE AMPUTATION;  Surgeon: [name], MD;  Location: [facility];  Service: Orthopedics;  Laterality: Right;  . AMPUTATION Left 09/21/2019   Procedure: LEFT BELOW KNEE AMPUTATION;  Surgeon: [name], MD;  Location: [facility];  Service: Orthopedics;  Laterality: Left;  . AMPUTATION TOE Left 09/09/2016   Procedure: AMPUTATION OF LEFT GREAT TOE;  Surgeon: Milly Jakob, MD;  Location: [facility];  Service: Orthopedics;  Laterality: Left;  . BELOW KNEE LEG AMPUTATION Left 09/21/2019  . CARDIAC CATHETERIZATION  06/24/2018  . CORONARY ARTERY BYPASS GRAFT N/A 06/28/2018   Procedure: CORONARY ARTERY BYPASS GRAFTING (CABG) times  four, using left internal mammary artery, endoscopically harvested right saphenous vein, and harvested  left radial artery;  Surgeon: [name], MD;  Location: [facility];  Service: Open Heart Surgery;  Laterality: N/A;  . ENDOVEIN HARVEST OF GREATER SAPHENOUS VEIN Right 06/28/2018   Procedure: ENDOVEIN HARVEST  OF GREATER SAPHENOUS VEIN;  Surgeon: [name], MD;  Location: [facility];  Service: Open Heart Surgery;  Laterality: Right;  . EYE SURGERY Left 06/2017   [facility]  . LEFT HEART CATH AND CORONARY ANGIOGRAPHY N/A 06/24/2018   Procedure: LEFT HEART CATH AND CORONARY ANGIOGRAPHY;  Surgeon: Troy Sine, MD;  Location: [facility];  Service: Cardiovascular;  Laterality: N/A;  . RADIAL ARTERY HARVEST Left 06/28/2018   Procedure: RADIAL ARTERY HARVEST;  Surgeon: [name], MD;  Location: [facility];  Service: Open Heart Surgery;  Laterality: Left;  . STUMP REVISION Left 11/18/2019   Procedure: REVISION LEFT BELOW KNEE AMPUTATION;  Surgeon: [name], MD;  Location: [facility];  Service: Orthopedics;  Laterality: Left;  . TEE WITHOUT CARDIOVERSION N/A 06/28/2018   Procedure: TRANSESOPHAGEAL ECHOCARDIOGRAM (TEE);  Surgeon: [name], MD;  Location: [facility];  Service: Open Heart Surgery;  Laterality: N/A;  . WISDOM TOOTH EXTRACTION     Social History   Occupational History  . Occupation: Glass blower/designer  Tobacco Use  . Smoking status: Former Smoker    Packs/day: 0.50    Quit date: 09/08/2005    Years since quitting: 14.8  . Smokeless tobacco: Never Used  . Tobacco comment: " 1 smoke a week in the past"  Vaping Use  . Vaping Use: Never used  Substance and Sexual Activity  . Alcohol use: No    Alcohol/week: 0.0 standard drinks  . Drug use: Not Currently  . Sexual activity: Yes    Partners: Male    Birth control/protection: Post-menopausal

## 2020-07-11 ENCOUNTER — Telehealth: Payer: Self-pay | Admitting: Internal Medicine

## 2020-07-11 ENCOUNTER — Encounter: Payer: Self-pay | Admitting: General Practice

## 2020-07-11 ENCOUNTER — Encounter: Payer: Self-pay | Admitting: Family

## 2020-07-11 NOTE — Telephone Encounter (Signed)
  We have attempted 3 times to contact patient to schedule recall appt but patients voicemail is full, recall letter sent

## 2020-07-25 ENCOUNTER — Telehealth: Payer: Self-pay | Admitting: Orthopedic Surgery

## 2020-07-25 NOTE — Telephone Encounter (Signed)
Received vm from [facility] w/ [employer]. Needs records after 4/21 for pts continued benefits. I faxed [phone] claim number [id number], ph [phone] ext [phone]

## 2020-07-27 DIAGNOSIS — I10 Essential (primary) hypertension: Secondary | ICD-10-CM | POA: Diagnosis not present

## 2020-07-27 DIAGNOSIS — F32A Depression, unspecified: Secondary | ICD-10-CM | POA: Diagnosis not present

## 2020-07-27 DIAGNOSIS — R531 Weakness: Secondary | ICD-10-CM | POA: Diagnosis not present

## 2020-07-28 ENCOUNTER — Emergency Department (HOSPITAL_COMMUNITY): Payer: BC Managed Care – PPO

## 2020-07-28 ENCOUNTER — Inpatient Hospital Stay (HOSPITAL_COMMUNITY)
Admission: EM | Admit: 2020-07-28 | Discharge: 2020-08-02 | DRG: 064 | Disposition: A | Discharge status: Home Health | Payer: BC Managed Care – PPO | Attending: Student | Admitting: Student

## 2020-07-28 ENCOUNTER — Inpatient Hospital Stay (HOSPITAL_COMMUNITY): Payer: BC Managed Care – PPO

## 2020-07-28 ENCOUNTER — Other Ambulatory Visit (HOSPITAL_COMMUNITY): Payer: BC Managed Care – PPO

## 2020-07-28 ENCOUNTER — Other Ambulatory Visit: Payer: Self-pay

## 2020-07-28 DIAGNOSIS — R29702 NIHSS score 2: Secondary | ICD-10-CM | POA: Diagnosis not present

## 2020-07-28 DIAGNOSIS — F419 Anxiety disorder, unspecified: Secondary | ICD-10-CM | POA: Diagnosis present

## 2020-07-28 DIAGNOSIS — F32A Depression, unspecified: Secondary | ICD-10-CM | POA: Diagnosis present

## 2020-07-28 DIAGNOSIS — Z9119 Patient's noncompliance with other medical treatment and regimen: Secondary | ICD-10-CM | POA: Diagnosis not present

## 2020-07-28 DIAGNOSIS — Z794 Long term (current) use of insulin: Secondary | ICD-10-CM

## 2020-07-28 DIAGNOSIS — G9341 Metabolic encephalopathy: Secondary | ICD-10-CM | POA: Diagnosis not present

## 2020-07-28 DIAGNOSIS — E876 Hypokalemia: Secondary | ICD-10-CM | POA: Diagnosis present

## 2020-07-28 DIAGNOSIS — N1831 Chronic kidney disease, stage 3a: Secondary | ICD-10-CM | POA: Diagnosis present

## 2020-07-28 DIAGNOSIS — Z66 Do not resuscitate: Secondary | ICD-10-CM | POA: Diagnosis not present

## 2020-07-28 DIAGNOSIS — Z89512 Acquired absence of left leg below knee: Secondary | ICD-10-CM | POA: Diagnosis not present

## 2020-07-28 DIAGNOSIS — I2510 Atherosclerotic heart disease of native coronary artery without angina pectoris: Secondary | ICD-10-CM | POA: Diagnosis not present

## 2020-07-28 DIAGNOSIS — E1142 Type 2 diabetes mellitus with diabetic polyneuropathy: Secondary | ICD-10-CM | POA: Diagnosis present

## 2020-07-28 DIAGNOSIS — G2581 Restless legs syndrome: Secondary | ICD-10-CM | POA: Diagnosis present

## 2020-07-28 DIAGNOSIS — I6330 Cerebral infarction due to thrombosis of unspecified cerebral artery: Secondary | ICD-10-CM | POA: Insufficient documentation

## 2020-07-28 DIAGNOSIS — I5032 Chronic diastolic (congestive) heart failure: Secondary | ICD-10-CM | POA: Diagnosis not present

## 2020-07-28 DIAGNOSIS — E039 Hypothyroidism, unspecified: Secondary | ICD-10-CM | POA: Diagnosis present

## 2020-07-28 DIAGNOSIS — E785 Hyperlipidemia, unspecified: Secondary | ICD-10-CM | POA: Diagnosis present

## 2020-07-28 DIAGNOSIS — I639 Cerebral infarction, unspecified: Secondary | ICD-10-CM

## 2020-07-28 DIAGNOSIS — I10 Essential (primary) hypertension: Secondary | ICD-10-CM | POA: Diagnosis not present

## 2020-07-28 DIAGNOSIS — G959 Disease of spinal cord, unspecified: Secondary | ICD-10-CM | POA: Diagnosis not present

## 2020-07-28 DIAGNOSIS — R471 Dysarthria and anarthria: Secondary | ICD-10-CM | POA: Diagnosis present

## 2020-07-28 DIAGNOSIS — Z20822 Contact with and (suspected) exposure to covid-19: Secondary | ICD-10-CM | POA: Diagnosis not present

## 2020-07-28 DIAGNOSIS — Z885 Allergy status to narcotic agent status: Secondary | ICD-10-CM

## 2020-07-28 DIAGNOSIS — E1151 Type 2 diabetes mellitus with diabetic peripheral angiopathy without gangrene: Secondary | ICD-10-CM | POA: Diagnosis not present

## 2020-07-28 DIAGNOSIS — Z79899 Other long term (current) drug therapy: Secondary | ICD-10-CM

## 2020-07-28 DIAGNOSIS — I672 Cerebral atherosclerosis: Secondary | ICD-10-CM | POA: Diagnosis not present

## 2020-07-28 DIAGNOSIS — R29701 NIHSS score 1: Secondary | ICD-10-CM | POA: Diagnosis not present

## 2020-07-28 DIAGNOSIS — Z87442 Personal history of urinary calculi: Secondary | ICD-10-CM

## 2020-07-28 DIAGNOSIS — K219 Gastro-esophageal reflux disease without esophagitis: Secondary | ICD-10-CM | POA: Diagnosis present

## 2020-07-28 DIAGNOSIS — E1122 Type 2 diabetes mellitus with diabetic chronic kidney disease: Secondary | ICD-10-CM | POA: Diagnosis present

## 2020-07-28 DIAGNOSIS — J9811 Atelectasis: Secondary | ICD-10-CM | POA: Diagnosis not present

## 2020-07-28 DIAGNOSIS — G9340 Encephalopathy, unspecified: Secondary | ICD-10-CM | POA: Diagnosis not present

## 2020-07-28 DIAGNOSIS — Z89511 Acquired absence of right leg below knee: Secondary | ICD-10-CM | POA: Diagnosis not present

## 2020-07-28 DIAGNOSIS — E119 Type 2 diabetes mellitus without complications: Secondary | ICD-10-CM | POA: Diagnosis not present

## 2020-07-28 DIAGNOSIS — R29710 NIHSS score 10: Secondary | ICD-10-CM | POA: Diagnosis not present

## 2020-07-28 DIAGNOSIS — Z79890 Hormone replacement therapy (postmenopausal): Secondary | ICD-10-CM

## 2020-07-28 DIAGNOSIS — Z823 Family history of stroke: Secondary | ICD-10-CM

## 2020-07-28 DIAGNOSIS — I6389 Other cerebral infarction: Secondary | ICD-10-CM | POA: Diagnosis not present

## 2020-07-28 DIAGNOSIS — I6529 Occlusion and stenosis of unspecified carotid artery: Secondary | ICD-10-CM | POA: Diagnosis not present

## 2020-07-28 DIAGNOSIS — Z951 Presence of aortocoronary bypass graft: Secondary | ICD-10-CM

## 2020-07-28 DIAGNOSIS — I130 Hypertensive heart and chronic kidney disease with heart failure and stage 1 through stage 4 chronic kidney disease, or unspecified chronic kidney disease: Secondary | ICD-10-CM | POA: Diagnosis not present

## 2020-07-28 DIAGNOSIS — G9389 Other specified disorders of brain: Secondary | ICD-10-CM | POA: Diagnosis not present

## 2020-07-28 DIAGNOSIS — Z880 Allergy status to penicillin: Secondary | ICD-10-CM

## 2020-07-28 DIAGNOSIS — R4182 Altered mental status, unspecified: Secondary | ICD-10-CM | POA: Diagnosis not present

## 2020-07-28 DIAGNOSIS — I63511 Cerebral infarction due to unspecified occlusion or stenosis of right middle cerebral artery: Secondary | ICD-10-CM | POA: Diagnosis not present

## 2020-07-28 DIAGNOSIS — R400 Somnolence: Secondary | ICD-10-CM

## 2020-07-28 DIAGNOSIS — I6381 Other cerebral infarction due to occlusion or stenosis of small artery: Principal | ICD-10-CM | POA: Diagnosis present

## 2020-07-28 DIAGNOSIS — Z833 Family history of diabetes mellitus: Secondary | ICD-10-CM

## 2020-07-28 DIAGNOSIS — R29703 NIHSS score 3: Secondary | ICD-10-CM | POA: Diagnosis not present

## 2020-07-28 DIAGNOSIS — E1165 Type 2 diabetes mellitus with hyperglycemia: Secondary | ICD-10-CM | POA: Diagnosis not present

## 2020-07-28 DIAGNOSIS — D72825 Bandemia: Secondary | ICD-10-CM | POA: Diagnosis not present

## 2020-07-28 DIAGNOSIS — R011 Cardiac murmur, unspecified: Secondary | ICD-10-CM | POA: Diagnosis present

## 2020-07-28 DIAGNOSIS — Z87891 Personal history of nicotine dependence: Secondary | ICD-10-CM

## 2020-07-28 DIAGNOSIS — D72829 Elevated white blood cell count, unspecified: Secondary | ICD-10-CM

## 2020-07-28 DIAGNOSIS — Z888 Allergy status to other drugs, medicaments and biological substances status: Secondary | ICD-10-CM

## 2020-07-28 DIAGNOSIS — Z8673 Personal history of transient ischemic attack (TIA), and cerebral infarction without residual deficits: Secondary | ICD-10-CM

## 2020-07-28 DIAGNOSIS — H547 Unspecified visual loss: Secondary | ICD-10-CM | POA: Diagnosis present

## 2020-07-28 DIAGNOSIS — I6523 Occlusion and stenosis of bilateral carotid arteries: Secondary | ICD-10-CM | POA: Diagnosis not present

## 2020-07-28 LAB — URINALYSIS, ROUTINE W REFLEX MICROSCOPIC
Bilirubin Urine: NEGATIVE
Glucose, UA: >=500 mg/dL — AB
Hgb urine dipstick: NEGATIVE
Ketones, ur: NEGATIVE mg/dL
Leukocytes,Ua: NEGATIVE
Nitrite: NEGATIVE
Protein, ur: >=300 mg/dL — AB
Specific Gravity, Urine: 1.02 (ref 1.005–1.030)
pH: 7 (ref 5.0–8.0)

## 2020-07-28 LAB — TSH: TSH: 4.535 u[IU]/mL — ABNORMAL HIGH (ref 0.350–4.500)

## 2020-07-28 LAB — BASIC METABOLIC PANEL
Anion gap: 9 (ref 5–15)
BUN: 16 mg/dL (ref 6–20)
CO2: 27 mmol/L (ref 22–32)
Calcium: 8.8 mg/dL — ABNORMAL LOW (ref 8.9–10.3)
Chloride: 103 mmol/L (ref 98–111)
Creatinine, Ser: 1.15 mg/dL — ABNORMAL HIGH (ref 0.44–1.00)
GFR, Estimated: 56 mL/min — ABNORMAL LOW (ref >60)
Glucose, Bld: 228 mg/dL — ABNORMAL HIGH (ref 70–99)
Potassium: 4.3 mmol/L (ref 3.5–5.1)
Sodium: 139 mmol/L (ref 135–145)

## 2020-07-28 LAB — COMPREHENSIVE METABOLIC PANEL
ALT: 17 U/L (ref 0–44)
AST: 17 U/L (ref 15–41)
Albumin: 2.6 g/dL — ABNORMAL LOW (ref 3.5–5.0)
Alkaline Phosphatase: 91 U/L (ref 38–126)
Anion gap: 11 (ref 5–15)
BUN: 16 mg/dL (ref 6–20)
CO2: 29 mmol/L (ref 22–32)
Calcium: 9.3 mg/dL (ref 8.9–10.3)
Chloride: 100 mmol/L (ref 98–111)
Creatinine, Ser: 1.17 mg/dL — ABNORMAL HIGH (ref 0.44–1.00)
GFR, Estimated: 55 mL/min — ABNORMAL LOW (ref >60)
Glucose, Bld: 113 mg/dL — ABNORMAL HIGH (ref 70–99)
Potassium: 4 mmol/L (ref 3.5–5.1)
Sodium: 140 mmol/L (ref 135–145)
Total Bilirubin: 0.4 mg/dL (ref 0.3–1.2)
Total Protein: 7.2 g/dL (ref 6.5–8.1)

## 2020-07-28 LAB — CBC WITH DIFFERENTIAL/PLATELET
Abs Immature Granulocytes: 0.07 10*3/uL (ref 0.00–0.07)
Abs Immature Granulocytes: 0.04 10*3/uL (ref 0.00–0.07)
Basophils Absolute: 0.1 10*3/uL (ref 0.0–0.1)
Basophils Absolute: 0.1 10*3/uL (ref 0.0–0.1)
Basophils Relative: 1 %
Basophils Relative: 1 %
Eosinophils Absolute: 0.1 10*3/uL (ref 0.0–0.5)
Eosinophils Absolute: 0.1 10*3/uL (ref 0.0–0.5)
Eosinophils Relative: 0 %
Eosinophils Relative: 1 %
HCT: 35.8 % — ABNORMAL LOW (ref 36.0–46.0)
HCT: 34.2 % — ABNORMAL LOW (ref 36.0–46.0)
Hemoglobin: 11.4 g/dL — ABNORMAL LOW (ref 12.0–15.0)
Hemoglobin: 10.9 g/dL — ABNORMAL LOW (ref 12.0–15.0)
Immature Granulocytes: 1 %
Immature Granulocytes: 0 %
Lymphocytes Relative: 6 %
Lymphocytes Relative: 10 %
Lymphs Abs: 0.9 10*3/uL (ref 0.7–4.0)
Lymphs Abs: 1.2 10*3/uL (ref 0.7–4.0)
MCH: 27.1 pg (ref 26.0–34.0)
MCH: 27.7 pg (ref 26.0–34.0)
MCHC: 31.8 g/dL (ref 30.0–36.0)
MCHC: 31.9 g/dL (ref 30.0–36.0)
MCV: 85.2 fL (ref 80.0–100.0)
MCV: 86.8 fL (ref 80.0–100.0)
Monocytes Absolute: 0.6 10*3/uL (ref 0.1–1.0)
Monocytes Absolute: 0.5 10*3/uL (ref 0.1–1.0)
Monocytes Relative: 4 %
Monocytes Relative: 4 %
Neutro Abs: 12.8 10*3/uL — ABNORMAL HIGH (ref 1.7–7.7)
Neutro Abs: 10.2 10*3/uL — ABNORMAL HIGH (ref 1.7–7.7)
Neutrophils Relative %: 88 %
Neutrophils Relative %: 84 %
Platelets: 506 10*3/uL — ABNORMAL HIGH (ref 150–400)
Platelets: 476 10*3/uL — ABNORMAL HIGH (ref 150–400)
RBC: 4.2 MIL/uL (ref 3.87–5.11)
RBC: 3.94 MIL/uL (ref 3.87–5.11)
RDW: 14 % (ref 11.5–15.5)
RDW: 14 % (ref 11.5–15.5)
WBC: 14.5 10*3/uL — ABNORMAL HIGH (ref 4.0–10.5)
WBC: 12 10*3/uL — ABNORMAL HIGH (ref 4.0–10.5)
nRBC: 0 % (ref 0.0–0.2)
nRBC: 0 % (ref 0.0–0.2)

## 2020-07-28 LAB — RAPID URINE DRUG SCREEN, HOSP PERFORMED
Amphetamines: NOT DETECTED
Barbiturates: NOT DETECTED
Benzodiazepines: NOT DETECTED
Cocaine: NOT DETECTED
Opiates: NOT DETECTED
Tetrahydrocannabinol: NOT DETECTED

## 2020-07-28 LAB — LIPID PANEL
Cholesterol: 271 mg/dL — ABNORMAL HIGH (ref 0–200)
HDL: 48 mg/dL (ref >40)
LDL Cholesterol: 199 mg/dL — ABNORMAL HIGH (ref 0–99)
Total CHOL/HDL Ratio: 5.6 RATIO
Triglycerides: 118 mg/dL (ref <150)
VLDL: 24 mg/dL (ref 0–40)

## 2020-07-28 LAB — SALICYLATE LEVEL: Salicylate Lvl: <7 mg/dL — ABNORMAL LOW (ref 7.0–30.0)

## 2020-07-28 LAB — GLUCOSE, CAPILLARY
Glucose-Capillary: 116 mg/dL — ABNORMAL HIGH (ref 70–99)
Glucose-Capillary: 126 mg/dL — ABNORMAL HIGH (ref 70–99)

## 2020-07-28 LAB — RESPIRATORY PANEL BY RT PCR (FLU A&B, COVID)
Influenza A by PCR: NEGATIVE
Influenza B by PCR: NEGATIVE
SARS Coronavirus 2 by RT PCR: NEGATIVE

## 2020-07-28 LAB — CSF CELL COUNT WITH DIFFERENTIAL
RBC Count, CSF: 11 /mm3 — ABNORMAL HIGH
RBC Count, CSF: 43 /mm3 — ABNORMAL HIGH
Tube #: 1
Tube #: 4
WBC, CSF: 1 /mm3 (ref 0–5)
WBC, CSF: 2 /mm3 (ref 0–5)

## 2020-07-28 LAB — PROTEIN AND GLUCOSE, CSF
Glucose, CSF: 110 mg/dL — ABNORMAL HIGH (ref 40–70)
Total  Protein, CSF: 66 mg/dL — ABNORMAL HIGH (ref 15–45)

## 2020-07-28 LAB — CBG MONITORING, ED
Glucose-Capillary: 102 mg/dL — ABNORMAL HIGH (ref 70–99)
Glucose-Capillary: 106 mg/dL — ABNORMAL HIGH (ref 70–99)
Glucose-Capillary: 165 mg/dL — ABNORMAL HIGH (ref 70–99)
Glucose-Capillary: 249 mg/dL — ABNORMAL HIGH (ref 70–99)

## 2020-07-28 LAB — HEMOGLOBIN A1C
Hgb A1c MFr Bld: 10.5 % — ABNORMAL HIGH (ref 4.8–5.6)
Mean Plasma Glucose: 254.65 mg/dL

## 2020-07-28 LAB — PROCALCITONIN: Procalcitonin: <0.1 ng/mL

## 2020-07-28 LAB — AMMONIA: Ammonia: 18 umol/L (ref 9–35)

## 2020-07-28 LAB — ETHANOL: Alcohol, Ethyl (B): <10 mg/dL (ref <10)

## 2020-07-28 LAB — ACETAMINOPHEN LEVEL: Acetaminophen (Tylenol), Serum: <10 ug/mL — ABNORMAL LOW (ref 10–30)

## 2020-07-28 MED ORDER — ONDANSETRON HCL 4 MG/2ML IJ SOLN
4.0000 mg | Freq: Once | INTRAMUSCULAR | Status: AC
  Administered 2020-07-28: 4 mg via INTRAVENOUS
  Filled 2020-07-28: qty 2

## 2020-07-28 MED ORDER — LEVETIRACETAM IN NACL 1500 MG/100ML IV SOLN
1500.0000 mg | Freq: Once | INTRAVENOUS | Status: AC
  Administered 2020-07-28: 1500 mg via INTRAVENOUS
  Filled 2020-07-28: qty 100

## 2020-07-28 MED ORDER — VANCOMYCIN HCL 500 MG/100ML IV SOLN
500.0000 mg | Freq: Two times a day (BID) | INTRAVENOUS | Status: DC
  Administered 2020-07-28: 500 mg via INTRAVENOUS
  Filled 2020-07-28 (×2): qty 100

## 2020-07-28 MED ORDER — NALOXONE HCL 0.4 MG/ML IJ SOLN
0.4000 mg | Freq: Once | INTRAMUSCULAR | Status: AC
  Administered 2020-07-28: 0.4 mg via INTRAVENOUS
  Filled 2020-07-28: qty 1

## 2020-07-28 MED ORDER — SODIUM CHLORIDE 0.9 % IV SOLN
2.0000 g | Freq: Once | INTRAVENOUS | Status: AC
  Administered 2020-07-28: 2 g via INTRAVENOUS
  Filled 2020-07-28: qty 20

## 2020-07-28 MED ORDER — PANTOPRAZOLE SODIUM 40 MG IV SOLR
40.0000 mg | INTRAVENOUS | Status: DC
  Administered 2020-07-28 – 2020-07-31 (×4): 40 mg via INTRAVENOUS
  Filled 2020-07-28 (×5): qty 40

## 2020-07-28 MED ORDER — SODIUM CHLORIDE 0.9 % IV SOLN
2.0000 g | Freq: Two times a day (BID) | INTRAVENOUS | Status: DC
  Administered 2020-07-28 – 2020-07-29 (×2): 2 g via INTRAVENOUS
  Filled 2020-07-28 (×4): qty 20

## 2020-07-28 MED ORDER — LORAZEPAM 2 MG/ML IJ SOLN
1.0000 mg | Freq: Once | INTRAMUSCULAR | Status: AC
  Administered 2020-07-28: 1 mg via INTRAVENOUS
  Filled 2020-07-28: qty 1

## 2020-07-28 MED ORDER — SODIUM CHLORIDE 0.9 % IV SOLN
250.0000 mg | Freq: Once | INTRAVENOUS | Status: DC
  Filled 2020-07-28: qty 2.5

## 2020-07-28 MED ORDER — VANCOMYCIN HCL 1500 MG/300ML IV SOLN
1500.0000 mg | Freq: Once | INTRAVENOUS | Status: AC
  Administered 2020-07-28: 1500 mg via INTRAVENOUS
  Filled 2020-07-28: qty 300

## 2020-07-28 MED ORDER — SODIUM CHLORIDE 0.9 % IV SOLN
750.0000 mg | Freq: Two times a day (BID) | INTRAVENOUS | Status: DC
  Administered 2020-07-28 – 2020-07-30 (×4): 750 mg via INTRAVENOUS
  Filled 2020-07-28 (×5): qty 7.5

## 2020-07-28 MED ORDER — ASPIRIN EC 81 MG PO TBEC: 81.0000 mg | DELAYED_RELEASE_TABLET | Freq: Every day | ORAL | Status: DC

## 2020-07-28 MED ORDER — DEXTROSE 5 % IV SOLN
600.0000 mg | Freq: Two times a day (BID) | INTRAVENOUS | Status: DC
  Administered 2020-07-28 – 2020-08-01 (×9): 600 mg via INTRAVENOUS
  Filled 2020-07-28 (×13): qty 12

## 2020-07-28 MED ORDER — METOPROLOL TARTRATE 5 MG/5ML IV SOLN
2.5000 mg | Freq: Four times a day (QID) | INTRAVENOUS | Status: DC
  Administered 2020-07-28 – 2020-08-02 (×20): 2.5 mg via INTRAVENOUS
  Filled 2020-07-28 (×22): qty 5

## 2020-07-28 MED ORDER — DEXTROSE 5 % IV SOLN
600.0000 mg | Freq: Once | INTRAVENOUS | Status: AC
  Administered 2020-07-28: 600 mg via INTRAVENOUS
  Filled 2020-07-28: qty 12

## 2020-07-28 MED ORDER — INSULIN ASPART 100 UNIT/ML ~~LOC~~ SOLN
0.0000 [IU] | SUBCUTANEOUS | Status: DC
  Administered 2020-07-28: 1 [IU] via SUBCUTANEOUS
  Administered 2020-07-28: 2 [IU] via SUBCUTANEOUS
  Administered 2020-07-28: 3 [IU] via SUBCUTANEOUS
  Administered 2020-07-29 (×2): 2 [IU] via SUBCUTANEOUS
  Administered 2020-07-29: 1 [IU] via SUBCUTANEOUS
  Administered 2020-07-29 (×2): 2 [IU] via SUBCUTANEOUS
  Administered 2020-07-30: 1 [IU] via SUBCUTANEOUS
  Administered 2020-07-30 – 2020-07-31 (×2): 2 [IU] via SUBCUTANEOUS
  Administered 2020-07-31 (×3): 1 [IU] via SUBCUTANEOUS
  Administered 2020-08-01: 2 [IU] via SUBCUTANEOUS
  Administered 2020-08-01: 3 [IU] via SUBCUTANEOUS
  Administered 2020-08-01 (×2): 1 [IU] via SUBCUTANEOUS
  Administered 2020-08-01: 2 [IU] via SUBCUTANEOUS
  Administered 2020-08-01: 1 [IU] via SUBCUTANEOUS
  Administered 2020-08-01 – 2020-08-02 (×3): 2 [IU] via SUBCUTANEOUS
  Administered 2020-08-02: 3 [IU] via SUBCUTANEOUS

## 2020-07-28 MED ORDER — LEVETIRACETAM IN NACL 500 MG/100ML IV SOLN
500.0000 mg | Freq: Two times a day (BID) | INTRAVENOUS | Status: DC
  Filled 2020-07-28: qty 100

## 2020-07-28 MED ORDER — LEVETIRACETAM IN NACL 500 MG/100ML IV SOLN
500.0000 mg | Freq: Once | INTRAVENOUS | Status: DC
  Filled 2020-07-28 (×2): qty 100

## 2020-07-28 MED ORDER — LEVETIRACETAM IN NACL 500 MG/100ML IV SOLN
500.0000 mg | Freq: Three times a day (TID) | INTRAVENOUS | Status: DC
  Administered 2020-07-28: 500 mg via INTRAVENOUS
  Filled 2020-07-28: qty 100

## 2020-07-28 MED ORDER — SODIUM CHLORIDE 0.9 % IV BOLUS (SEPSIS)
500.0000 mL | Freq: Once | INTRAVENOUS | Status: AC
  Administered 2020-07-28: 500 mL via INTRAVENOUS

## 2020-07-28 MED ORDER — SODIUM CHLORIDE 0.9 % IV SOLN
2.0000 g | Freq: Once | INTRAVENOUS | Status: AC
  Administered 2020-07-28: 2 g via INTRAVENOUS
  Filled 2020-07-28: qty 2

## 2020-07-28 MED ORDER — ENOXAPARIN SODIUM 40 MG/0.4ML ~~LOC~~ SOLN
40.0000 mg | Freq: Every day | SUBCUTANEOUS | Status: DC
  Administered 2020-07-28 – 2020-08-02 (×6): 40 mg via SUBCUTANEOUS
  Filled 2020-07-28 (×6): qty 0.4

## 2020-07-28 MED ORDER — LEVOTHYROXINE SODIUM 100 MCG/5ML IV SOLN
12.5000 ug | Freq: Every day | INTRAVENOUS | Status: DC
  Filled 2020-07-28: qty 5

## 2020-07-28 MED ORDER — SODIUM CHLORIDE 0.9 % IV SOLN
2.0000 g | Freq: Four times a day (QID) | INTRAVENOUS | Status: DC
  Administered 2020-07-28 – 2020-07-29 (×4): 2 g via INTRAVENOUS
  Filled 2020-07-28 (×8): qty 2000
  Filled 2020-07-28: qty 2

## 2020-07-28 MED ORDER — CLOPIDOGREL BISULFATE 75 MG PO TABS
75.0000 mg | ORAL_TABLET | Freq: Every day | ORAL | Status: DC
  Administered 2020-07-31 – 2020-08-02 (×3): 75 mg via ORAL
  Filled 2020-07-28 (×4): qty 1

## 2020-07-28 NOTE — Progress Notes (Signed)
Echo and Carotid doppler attempted, patient is getting stat EEG at this time. [place]

## 2020-07-28 NOTE — Plan of Care (Addendum)
Paged about this pt having neurological change in condition with increased NIH. Stat Head CT was ordered by Dr Cheral Marker. I did not receive page until after CT had already been ordered and I returned page as soon as it was received. Discussed pt with Dr Merlene Laughter who had just seen pt 30 minutes prior. He felt pt was having seizures and ordered EEG and Keppra.  I changed both orders to stat. Dr Merlene Laughter recommended waiting on CT results at this point. Pt had not yet received aspirin as she is NPO. Lovenox had also been ordered but not yet given. I spoke to RN and said to hold both medications until CT results were back. I d/c'd aspirin for now and gave verbal order to hold Lovenox until CT results are available.There was some confusion about Keppra dose. Dr Merlene Laughter having computer "chat" with pharmacist.  Mikey Bussing PA-C [facility] Pager [phone] 07/28/2020, 11:52 AM   Addenndum: 1:36 PM  CT Head WO Contrast 07/28/20  IMPRESSION: 1. Stable CT appearance of the brain from earlier today today with a mix of acute, subacute, and chronic ischemia in the Right PCA territory. No associated hemorrhage or mass effect. 2. Chronic small-vessel ischemia elsewhere. No new intracranial abnormality.  Mikey Bussing PA-C [facility] Pager [phone] 07/28/2020, 1:36 PM

## 2020-07-28 NOTE — Progress Notes (Signed)
STROKE TEAM PROGRESS NOTE   HISTORY OF PRESENT ILLNESS (per record) [name] is a 54 y.o. female past medical history of diabetes, peripheral vascular disease status post bilateral BKA's, prior ICH with some residual left sided stiffness but no gross weakness, hypertension, hyperlipidemia, presented to the emergency room for altered mental status evaluation. Last known normal according to the ER was somewhere around 7:30 PM on 07/27/2020 when family found that she has been out of it.  She has some dysarthria at baseline according to family and son last saw the patient normal at 10:30 AM and when he got back at 10 PM, she was in the bathroom with evidence of having had urinated on herself and he could not get her to respond.  He had to shake her to wake her up to give some responses.  He found some oxycodone around her making concern for opiate overdose. Brought into the emergency room.  Nonfocal exam on initial screening. Noncontrast head CT with concern for right occipital stroke and right thalamic stroke-new from imaging of 2017. Case discussed with me over the phone. Due to nonfocal exam, recommended to follow-up the CT findings with MRI. Of note, she has leukocytosis.  Urinalysis negative.  Chest x-ray is unremarkable.  LKW: 10:30 AM on 07/27/2020 tpa given?: no, outside the window Premorbid modified Rankin scale (mRS):2-3   INTERVAL HISTORY She has had a quite eventful 24 hours.  She is being stuporous and unresponsive most of the time.  Repeat stat CT scan has been unrevealing for any acute changes.  She has been loaded with IV Keppra and her stat EEG shows slowing but no epileptiform discharges.  She appears to have improved this morning.  She is noted to be severely impaired visually.  Her chart shows visual impairment.  I did attempt to discuss the case with the son twice but to no avail.    OBJECTIVE Vitals:   07/29/20 0001 07/29/20 0406 07/29/20 0604 07/29/20 1200  BP:  (!) 143/75 (!) 160/95 128/69 135/70  Pulse: 72 90 75 80  Resp: 18   10  Temp: 97.9 F (36.6 C) 97.6 F (36.4 C)  98 F (36.7 C)  TempSrc: Oral Oral  Oral  SpO2: 98% 99% 97% 98%  Weight:      Height:        CBC:  Recent Labs  Lab 07/28/20 0527 07/29/20 0152  WBC 12.0* 9.9  NEUTROABS 10.2* 6.6  HGB 10.9* 11.1*  HCT 34.2* 35.1*  MCV 86.8 87.1  PLT 476* 490*    Basic Metabolic Panel:  Recent Labs  Lab 07/28/20 0527 07/29/20 0152  NA 139 140  K 4.3 4.4  CL 103 105  CO2 27 26  GLUCOSE 228* 128*  BUN 16 14  CREATININE 1.15* 1.31*  CALCIUM 8.8* 8.7*  MG  --  1.7  PHOS  --  4.0    Lipid Panel:     Component Value Date/Time   CHOL 271 (H) 07/28/2020 0527   TRIG 118 07/28/2020 0527   HDL 48 07/28/2020 0527   CHOLHDL 5.6 07/28/2020 0527   VLDL 24 07/28/2020 0527   LDLCALC 199 (H) 07/28/2020 0527   HgbA1c:  Lab Results  Component Value Date   HGBA1C 10.5 (H) 07/28/2020   Urine Drug Screen:     Component Value Date/Time   LABOPIA NONE DETECTED 07/28/2020 0300   COCAINSCRNUR NONE DETECTED 07/28/2020 0300   LABBENZ NONE DETECTED 07/28/2020 0300   AMPHETMU NONE DETECTED 07/28/2020  0300   THCU NONE DETECTED 07/28/2020 0300   LABBARB NONE DETECTED 07/28/2020 0300    Alcohol Level     Component Value Date/Time   ETH <10 07/28/2020 0150    IMAGING  CT Head Wo Contrast 07/28/2020 IMPRESSION:  1. Geographic cortically based hypoattenuation situated right occipital lobe is new from comparison imaging and may reflect a subacute infarct.  2. New hypoattenuating foci in the right thalamus and basal ganglia could reflect sequela of age-indeterminate lacunar type infarction new from 2017. A similar focus in the left insula is unchanged from comparison.  3. Chronic microvascular angiopathy and parenchymal volume loss.   MR ANGIO HEAD WO CONTRAST  07/28/2020 IMPRESSION:   MRI HEAD  IMPRESSION:  1. 7 mm acute ischemic nonhemorrhagic infarct involving the  ventral right thalamus.  2. Additional 11 mm acute to early subacute ischemic nonhemorrhagic right periatrial white matter infarcts, with extension to involve the adjacent dorsal right thalamus.  3. Late subacute to chronic ischemic infarcts involving the right parieto-occipital region, right corona radiata, and anterior left corpus callosum/corona radiata.  4. Underlying moderate chronic small vessel ischemic disease with multiple remote lacunar infarcts as above.   MRA HEAD  IMPRESSION:  1. Negative intracranial MRA for large vessel occlusion.  2. Focal severe proximal right P2 stenosis, likely accounting for the extensive right PCA distribution ischemic changes.  3. Additional severe stenosis at the right MCA bifurcation to involve the adjacent proximal M2 segments.  4. Moderate distal small vessel atheromatous irregularity throughout the intracranial circulation.   DG Chest Portable 1 View 07/28/2020 IMPRESSION:  1. Low lung volumes, atelectasis  2. Mild vascular congestion without features of frank edema.  3. Prior sternotomy and CABG.   Transthoracic Echocardiogram  07/29/2020 IMPRESSIONS  1. Left ventricular ejection fraction, by estimation, is 55 to 60%. The  left ventricle has normal function. The left ventricle has no regional  wall motion abnormalities. Left ventricular diastolic parameters are  consistent with Grade II diastolic  dysfunction (pseudonormalization). Elevated left ventricular end-diastolic  pressure.  2. Right ventricular systolic function is normal. The right ventricular  size is normal.  3. The mitral valve is normal in structure. Trivial mitral valve  regurgitation. No evidence of mitral stenosis.  4. The aortic valve is tricuspid. Aortic valve regurgitation is not  visualized. Mild aortic valve sclerosis is present, with no evidence of  aortic valve stenosis.  5. The inferior vena cava is normal in size with greater than 50%  respiratory  variability, suggesting right atrial pressure of 3 mmHg.  6. Agitated saline contrast bubble study was negative, with no evidence  of any interatrial shunt.   Bilateral Carotid Dopplers  11/21//2021 Summary:  Right Carotid: Velocities in the right ICA are consistent with a 1-39% stenosis.  Left Carotid: Velocities in the left ICA are consistent with a 1-39% stenosis.  Vertebrals: Bilateral vertebral arteries demonstrate antegrade flow.   ECG - SR rate 95 BPM. (See cardiology reading for complete details)  EEG 07/28/2020 ABNORMALITY -Continued slow, generalized -Intermittent rhythmic delta slow, generalized IMPRESSION: This study is suggestive of moderate diffuse encephalopathy, nonspecific etiology. No seizures or epileptiform discharges were seen throughout the recording.  PHYSICAL EXAM Blood pressure 135/70, pulse 80, temperature 98 F (36.7 C), temperature source Oral, resp. rate 10, height 5\' 2"  (1.575 m), weight 60 kg, last menstrual period 02/12/2011, SpO2 98 %.  GENERAL: She is awake and responsive today.  HEENT: Edentulous  ABDOMEN: soft  EXTREMITIES: No edema; bilateral leg amputation  BACK: Normal  SKIN: Normal by inspection.    MENTAL STATUS:   CRANIAL NERVES: Pupils are equal, round and reactive to light but clearly she has poor vision and only sees shadows on exam testing at the bedside; extra ocular movements are full, there is no significant nystagmus; upper and lower facial muscles are normal in strength and symmetric, there is no flattening of the nasolabial folds; tongue is midline; uvula is midline.  MOTOR: Normal tone, bulk and strength in the upper extremities; no pronator drift.  COORDINATION: Left finger to nose is normal, right finger to nose is normal, No rest tremor; no intention tremor; no postural tremor; no bradykinesia.  SENSATION:   The head CT scan and brain MRI imaging are both reviewed in person.  The MRI shows increased signal  involving the right thalamic region lateral aspect and basal ganglia.  There is also increase of involving the atria on the right side and right occipital and parietal occipital area also on the right side.  The only area showing reduced signal on the ADC scan is the thalamic region in one of the atrial lesions indicating the others are subacute chroniic which are also associated with encephalomalacia and laminectomy necrosis especially involving the occipital and occipital parietal areas.  There is areas of microhemorrhage involving the right basal ganglia and right frontal parietal area.  CT shows hypodensity involving the right thalamic area, right basal ganglia and the right occipital and occipital parietal areas.         ASSESSMENT/PLAN [name] is a 54 y.o. female with history of diabetes, peripheral vascular disease status post bilateral BKA's, prior ICH with some residual left sided stiffness but no gross weakness, CAD, hypertension, hyperlipidemia, and some dysarthria at baseline presented to the emergency room for altered mental status, poorly responsive, concern for possible opioid use, leukocytosis, and head CT revealing possible stroke. She did not receive IV t-PA due to late presentation (>4.5 hours from time of onset).   Stroke: acute ischemic nonhemorrhagic infarct involving the ventral right thalamus. Additional 11 mm acute to early subacute ischemic nonhemorrhagic right periatrial white matter infarcts, with extension to involve the adjacent dorsal right thalamus.  Resultant visual impairment  Code Stroke CT Head - not ordered   CT head - Geographic cortically based hypoattenuation situated right occipital lobe is new from comparison imaging and may reflect a subacute infarct. New hypoattenuating foci in the right thalamus and basal ganglia could reflect sequela of age-indeterminate lacunar type infarction new from 2017. A similar focus in the left insula is  unchanged from comparison.   MRI head - 7 mm acute ischemic nonhemorrhagic infarct involving the ventral right thalamus. Additional 11 mm acute to early subacute ischemic nonhemorrhagic right periatrial white matter infarcts, with extension to involve the adjacent dorsal right thalamus. Late subacute to chronic ischemic infarcts involving the right parieto-occipital region, right corona radiata, and anterior left corpus callosum/corona radiata.   MRA head - Focal severe proximal right P2 stenosis, likely accounting for the extensive right PCA distribution ischemic changes. Additional severe stenosis at the right MCA bifurcation to involve the adjacent proximal M2 segments.    EEG - 07/28/2020 - This study is suggestive of moderate diffuse encephalopathy, nonspecific etiology. No seizures or epileptiform discharges were seen throughout the recording.  CTA H&N - not ordered  CT Perfusion - not ordered  Carotid Doppler - unremarkable  2D Echo - EF 55 - 60%. No cardiac source of emboli identified.  Sars Corona Virus 2 - negative  LDL - 199  HgbA1c - 10.5  UDS - negative  VTE prophylaxis - Lovenox Diet  Diet Order            Diet NPO time specified  Diet effective now                 No antithrombotic prior to admission, start asa 81 mg per Dr Johny Chess note -> ASA not started due to NPO status and change in neuro exam -> CT Head - stable - no bleeding - start aspirin suppositories 300 mg daily although there is some concern about possible endocarditis. Plavix ordered yesterday as well but pt NPO.  Patient counseled to be compliant with her antithrombotic medications  Ongoing aggressive stroke risk factor management  Therapy recommendations:  [place] placement recommended by therapists  Disposition:  Pending  Hypertension  Home BP meds: Cozaar ; Norvasc  Current BP meds: metoprolol  Stable . Permissive hypertension (OK if < 220/120) but gradually  normalize in 5-7 days . Long-term BP goal normotensive  Hyperlipidemia  Home Lipid lowering medication: Lipitor 80 mg daily  LDL 199, goal < 70  Current lipid lowering medication: none - consider Lipitor 80 mg daily if no contra indication  Continue statin at discharge  Diabetes  Home diabetic meds: Insulin ; metformin  Current diabetic meds: SSI   HgbA1c 10.5, goal < 7.0 Recent Labs    07/29/20 0401 07/29/20 0950 07/29/20 1215  GLUCAP 135* 130* 155*    Acute Encephalopathy  Lumbar puncture - 07/28/20 - cultures no growth day 1 ; Glucose 110 (H) ; Total protein 66 (H)  Blood cultures - no growth day 1   Vancomycin ; Rocephin ; Ampicillin ; Zovirax - started 11/20 for possible meningitis  Leukocytosis - WBC's - 14.5->12.0->9.9 (afebrile)    Persistent and recurrent worsening of encephalopathy.  Work-up unrevealing but concerns for status epilepticus.  Improve with loading dose of Keppra.  No evidence of meningitis or other forms of infectious etiology.  Antibiotics are all discontinued except for the antiviral agents acyclovir which will likely be discontinued once the HSV PCR returns.  Other Stroke Risk Factors  Former cigarette smoker - quit  Hx stroke/TIA - prior ICH  Migraines  Congestive Heart Failure  Previous substance abuse  CAD  Other Active Problems  Code status - Full code  Suspected seizures - Keppra given yesterday only. EEG as above.  CKD - stage 3a  - creatinine - 1.15->1.31    Hospital day # 1  This patient is critically ill due to stroke, new and recurrent encephalopathy and at significant risk of neurological worsening, death form severe anemia, bleeding, recurrent stroke, intracranial stenosis. This patient's care requires constant monitoring of vital signs, hemodynamics, respiratory and cardiac monitoring, review of multiple databases, neurological assessment, other specialists and medical decision making of high complexity. I spent  35 minutes of neurocritical care time in the care of this patient.    To contact Stroke Continuity provider, please refer to [email or web address] After hours, contact General Neurology

## 2020-07-28 NOTE — ED Notes (Signed)
Off floor at CT

## 2020-07-28 NOTE — ED Notes (Signed)
Dr. Trula Ore returned page. Stated to get stat CT

## 2020-07-28 NOTE — ED Notes (Signed)
Paged (on call)  Dr. Rory Percy, [place]

## 2020-07-28 NOTE — Progress Notes (Signed)
The patient presents with altered mental status.  The presentation is concerning for unwitnessed seizure or nonconvulsive seizures.  Imaging shows a remote cortical infarct involving the right occipital lobe.  There is evidence of acute small infarct involving the right thalamic region and the atria but the acute infarct seems unlikely to explain the encephalopathy.  Given the large vessel prior infarct, there is concerned that she could be having seizures.  She will be loaded with Keppra and an urgent EEG will be obtained.  Because of patient being quite stuporous, repeats CT scan was ordered but unchanged.  She currently lays in bed with eyes closed but opens eyes only to deep painful stimuli.  She does respond yes or no and follows simple commands.  She moves both sides well to painful stimuli and to command.

## 2020-07-28 NOTE — ED Notes (Signed)
Verified with pharmacy and Charge in regards to corrercrt amount of Keppra to give and proper med admin and charting

## 2020-07-28 NOTE — Progress Notes (Signed)
Stat  EEG complete - results pending.  

## 2020-07-28 NOTE — ED Notes (Signed)
Spoke with Pharm before giving. He stated ok to give and he will bring another mix soon

## 2020-07-28 NOTE — ED Notes (Signed)
EEG bring conducted

## 2020-07-28 NOTE — ED Triage Notes (Signed)
Coming from home by [facility], c/o not feeling well and increased lethargy today. Family noticed today pt is slow to answer questions.  Pt takes percocet. [id number] with EMS.

## 2020-07-28 NOTE — ED Provider Notes (Addendum)
TIME SEEN: 1:04 AM  CHIEF COMPLAINT: AMS  HPI: Patient is a 54 year old female with history of insulin-dependent diabetes, hypertension, hyperlipidemia, CVA, peripheral vascular disease, CHF who presents to the emergency department with EMS with altered mental status.  Patient is not able to provide much history.  She denies any head injury, headache, chest pain, shortness of breath, abdominal pain.  She denies any drug or alcohol use today.  Denies taking any narcotics.  Spoke with son, Tommi Rumps, by phone [phone]).  He is worried that she is not taking her medicine appropriately.  He reports she has been "out of it".  Has h/o CVA and has dysarthria at baseline.  Son last saw patient normal at 10:30am and she was normal.  Son came home at 10pm and patient was in bathroom and had urinated on herself and was asleep in her wheelchair and took him shaking her to get her awake.  Would fall back asleep quickly.  He found bottle of oxycodone in her purse that he reports she was hiding from him and CNA.  He suspects that she took too much pain medication.  He is worried this is related to her depression.  She has said that she doesn't care if she lives or dies when she gets frustrated but he does not feel that she is suicidal.     ROS: Level V caveat due to AMS  PAST MEDICAL HISTORY/PAST SURGICAL HISTORY:  Past Medical History:  Diagnosis Date  . Abscess of great toe, right   . Acute on chronic diastolic CHF (congestive heart failure) ([place]) 06/24/2018  . Acute osteomyelitis of toe, left ([place]) 09/05/2016  . Amputation of right great toe ([place]) 12/22/2018  . Anxiety   . Cataract    left - surgery to remove  . Cellulitis of foot, right 12/11/2018  . COMMON MIGRAINE 10/07/2010  . Coronary artery disease   . Decreased visual acuity 11/10/2016  . Depression 12/18/2012  . Diabetes mellitus type II, uncontrolled ([place]) 10/07/2010   Qualifier: Diagnosis of  By: Charlett Blake MD, [name]    . Diabetic foot infection ([place])  08/26/2016  . Diabetic infection of right foot ([place])   . Disturbance of skin sensation 10/07/2010  . Gangrene of right foot ([place])   . GERD (gastroesophageal reflux disease)   . Heart murmur   . History of kidney stones    "years ago" - passed stones  . Hyperlipidemia 12/06/2010  . Hypertension   . Hypothyroidism   . Lipoma of abdominal wall 10/05/2016  . Overweight(278.02) 12/06/2010  . PERIPHERAL NEUROPATHY, FEET 10/07/2010   11/17/2019  . PVD (peripheral vascular disease) ([place]) 01/21/2012  . RESTLESS LEG SYNDROME 10/25/2010  . Stroke [facility]) 2014, 2017   most recently in 2/17 - intracerebral hemorrhage    MEDICATIONS:  Prior to Admission medications   Medication Sig Start Date End Date Taking? Authorizing Provider  acetaminophen (TYLENOL) 325 MG tablet Take 2 tablets (650 mg total) by mouth every 6 (six) hours as needed for mild pain or headache. 07/06/18   Lars Pinks M, PA-C  Amino Acids-Protein Hydrolys (FEEDING SUPPLEMENT, PRO-STAT 64,) LIQD Take 30 mLs by mouth 3 (three) times daily with meals. 07/04/20   Suzan Slick, NP  amLODipine (NORVASC) 10 MG tablet TAKE 1 TABLET BY MOUTH EVERY DAY 02/10/20   Saguier, Percell Miller, PA-C  atorvastatin (LIPITOR) 80 MG tablet Take 1 tablet (80 mg total) by mouth daily at 6 PM. 10/11/19   Angiulli, Lavon Paganini, PA-C  azelastine (ASTELIN) 0.1 %  nasal spray Place 2 sprays into both nostrils 2 (two) times daily. Use in each nostril as directed 01/23/20   Mosie Lukes, MD  blood glucose meter kit and supplies Dispense based on patient and insurance preference. Use up to four times daily as directed. DX E11.59 11/25/19   Mosie Lukes, MD  docusate sodium (COLACE) 100 MG capsule Take 1 capsule (100 mg total) by mouth 2 (two) times daily. 01/13/19   Angiulli, Lavon Paganini, PA-C  escitalopram (LEXAPRO) 10 MG tablet TAKE 1 TABLET BY MOUTH EVERY DAY 05/18/20   Mosie Lukes, MD  ferrous sulfate 325 (65 FE) MG EC tablet TAKE 1 TABLET BY MOUTH EVERY DAY WITH  BREAKFAST 03/26/20   Saguier, Percell Miller, PA-C  fluconazole (DIFLUCAN) 150 MG tablet Take 1 tablet (150 mg total) by mouth once a week. For the next 4-8 weeks. 05/04/20   Mosie Lukes, MD  HYDROcodone-acetaminophen (NORCO/VICODIN) 5-325 MG tablet Take 1 tablet by mouth every 6 (six) hours as needed for moderate pain. 04/13/20   Persons, Bevely Palmer, PA  insulin aspart (NOVOLOG FLEXPEN) 100 UNIT/ML FlexPen Inject 4 Units into the skin 3 (three) times daily with meals. 02/03/20   Mosie Lukes, MD  insulin glargine (LANTUS) 100 UNIT/ML Solostar Pen 27 units SQ qhs and then increase by 2 units every 3 days if no sugars below 150 02/12/20   Mosie Lukes, MD  Insulin Pen Needle (PEN NEEDLES 31GX5/16") 31G X 8 MM MISC Use as directed with Lantus and Novolog pen 12/02/19   Mosie Lukes, MD  levothyroxine (SYNTHROID) 25 MCG tablet TAKE 1 TABLET (25 MCG TOTAL) BY MOUTH DAILY BEFORE BREAKFAST. Patient taking differently: Take 25 mcg by mouth daily before breakfast.  01/13/19   Angiulli, Lavon Paganini, PA-C  losartan (COZAAR) 25 MG tablet TAKE 1 TABLET (25 MG TOTAL) BY MOUTH DAILY 03/22/20   Saguier, Percell Miller, PA-C  metFORMIN (GLUCOPHAGE) 1000 MG tablet Take 1 tablet (1,000 mg total) by mouth 2 (two) times daily with a meal. 11/25/19   Carollee Herter, Alferd Apa, DO  methocarbamol (ROBAXIN) 500 MG tablet Take 1 tablet (500 mg total) by mouth every 6 (six) hours as needed for muscle spasms. 10/11/19   Angiulli, Lavon Paganini, PA-C  metoCLOPramide (REGLAN) 5 MG tablet Take 1 tablet (5 mg total) by mouth 4 (four) times daily -  before meals and at bedtime. 01/13/19   Angiulli, Lavon Paganini, PA-C  Multiple Vitamin (MULTIVITAMIN WITH MINERALS) TABS tablet Take 1 tablet by mouth daily. 01/14/19   Angiulli, Lavon Paganini, PA-C  nystatin cream (MYCOSTATIN) Apply 1 application topically 2 (two) times daily. 03/30/20   Mosie Lukes, MD  oxyCODONE-acetaminophen (PERCOCET) 5-325 MG tablet Take 1 tablet by mouth 2 (two) times daily as needed for severe pain.  07/10/20 07/10/21  Suzan Slick, NP  pantoprazole (PROTONIX) 40 MG tablet Take 1 tablet (40 mg total) by mouth daily. 10/11/19   Angiulli, Lavon Paganini, PA-C  polyethylene glycol (MIRALAX / GLYCOLAX) 17 g packet Take 17 g by mouth daily. 10/12/19   Angiulli, Lavon Paganini, PA-C    ALLERGIES:  Allergies  Allergen Reactions  . Lyrica [Pregabalin] Other (See Comments)    MADE PATIENT VERY EMOTIONAL AND WOULD CRY EASILY  . Morphine And Related Nausea And Vomiting  . Penicillins Other (See Comments)    "Made patient feel sick" Did it involve swelling of the face/tongue/throat, SOB, or low BP? No Did it involve sudden or severe rash/hives, skin peeling, or  any reaction on the inside of your mouth or nose? No Did you need to seek medical attention at a hospital or doctor's office? No When did it last happen?April 2020 If all above answers are "NO", may proceed with cephalosporin use.   . Tramadol Nausea And Vomiting    Pt cant tolerate this pain med.     SOCIAL HISTORY:  Social History   Tobacco Use  . Smoking status: Former Smoker    Packs/day: 0.50    Quit date: 09/08/2005    Years since quitting: 14.8  . Smokeless tobacco: Never Used  . Tobacco comment: " 1 smoke a week in the past"  Substance Use Topics  . Alcohol use: No    Alcohol/week: 0.0 standard drinks    FAMILY HISTORY: Family History  Problem Relation Age of Onset  . Arthritis Mother   . Stroke Brother        previous smoker  . Alcohol abuse Brother        in remission  . Leukemia Brother   . Diabetes Paternal Grandmother   . Healthy Son     EXAM: BP (!) 175/98   Pulse 100   Temp 97.8 F (36.6 C) (Oral)   Resp 14   Ht 5' 2" (1.575 m)   Wt 60 kg   LMP 02/12/2011 (Exact Date)   SpO2 98%   BMI 24.19 kg/m  CONSTITUTIONAL: Alert and oriented person only.  Speech is slurred and difficult to understand.  She is drowsy and falls asleep quickly but wakes up to voice. HEAD: Normocephalic, atraumatic EYES:  Conjunctivae clear, pupils appear equal, EOM appear intact ENT: normal nose; moist mucous membranes NECK: Supple, normal ROM CARD: RRR; S1 and S2 appreciated; no murmurs, no clicks, no rubs, no gallops RESP: Normal chest excursion without splinting or tachypnea; breath sounds clear and equal bilaterally; no wheezes, no rhonchi, no rales, no hypoxia or respiratory distress, speaking full sentences ABD/GI: Normal bowel sounds; non-distended; soft, non-tender, no rebound, no guarding, no peritoneal signs, no hepatosplenomegaly BACK:  The back appears normal EXT: Normal ROM in all joints; no deformity noted, no edema; no cyanosis; bilateral BKA SKIN: Normal color for age and race; warm; no rash on exposed skin NEURO: Moves all extremities equally, no drift, cranial nerves II through XII intact, patient drowsy and falls asleep quickly during examination, reports normal sensation diffusely, dysarthria, intermittent garbled speech before falling back asleep, unable to test visual fields, does not appear to have neglect PSYCH: The patient's mood and manner are appropriate.   MEDICAL DECISION MAKING: Patient here likely intoxicated from narcotics.  EMS reports 11 Percocet tablets missing from her bottle.  Will give Narcan and reassess.  If no significant improvement, will obtain head CT, urine, labs.  We will keep son up-to-date by phone.  At this time he does not feel that this was a suicide attempt or that she needs emergent psychiatric evaluation but will reassess patient once more awake.  Differential also includes intracranial hemorrhage, CVA, metabolic derangement, hepatic encephalopathy.  Doubt infectious etiology at this time.  No sign of trauma on exam.  Blood sugar normal.  Patient outside treatment window for TPA if this is a CVA as it has been more than 4-1/2 hours since she was last seen normal.  She is not LVO positive.  ED PROGRESS: Nurse reports no significant change after Narcan given.  We will  proceed with further work-up.   Discussed with radiology.  Patient may have a subacute  infarct on CT imaging.  Will obtain MRI brain and discuss with neurologist.   3:10 AM D/w Dr. Rory Percy with neurology.  He has reviewed patient's imaging and agrees with MRI brain and recommends MRI brain without contrast.  He feels if this is a stroke it is likely more subacute, several days old rather than acute.   After talking to son further on the phone, he states that she has been forgetting things for a couple of months.  She's been off her medications for several days as he states that she has been "stubborn".  He stated that a family friend however saw her last at 7:30 pm last night and did not notice any significant difference in her behavior or speech at that time.    3:25 AM Discussed patient's case with hospitalist, Dr. Nevada Crane.  I have recommended admission and patient (and family if present) agree with this plan. Admitting physician will place admission orders.   I reviewed all nursing notes, vitals, pertinent previous records and reviewed/interpreted all EKGs, lab and urine results, imaging (as available).   6:05 AM  Pt seen by Dr. Rory Percy with neurology has seen patient and reviewed imaging.  Pt appears to have acute and subacute strokes.  Neuro agrees that with nonfocal neuro exam pt would not have been given TPA on arrival. Neuro concerned that patient may have infectious etiology and recommends blood cultures and lumbar puncture.  He has placed CSF orders.  Consent obtained on the phone with patient's son Tommi Rumps by myself and patient's nurse Rosendo Gros.  LP performed and CSF pending.  Opening pressure 15.  Will start abx to cover for meningitis.  .Lumbar Puncture  Date/Time: 07/28/2020 6:10 AM Performed by: , Delice Bison, DO Authorized by: , Delice Bison, DO   Consent:    Consent obtained:  Verbal   Consent given by: patient's son.   Risks discussed:  Bleeding, infection, headache, nerve  damage, pain and repeat procedure   Alternatives discussed:  Delayed treatment Pre-procedure details:    Procedure purpose:  Diagnostic   Preparation: Patient was prepped and draped in usual sterile fashion   Sedation:    Sedation type:  Anxiolysis Anesthesia (see MAR for exact dosages):    Anesthesia method:  None Procedure details:    Lumbar space:  L4-L5 interspace   Patient position:  L lateral decubitus   Needle gauge:  18   Needle type:  Spinal needle - Quincke tip   Needle length (in):  3.5   Ultrasound guidance: no     Number of attempts:  1   Opening pressure (cm H2O):  15   Fluid appearance:  Blood-tinged then clearing   Tubes of fluid:  4   Total volume (ml):  5 Post-procedure:    Puncture site:  Adhesive bandage applied and direct pressure applied   Patient tolerance of procedure:  Tolerated well, no immediate complications    CRITICAL CARE Performed by: Pryor Curia   Total critical care time: 50 minutes  Critical care time was exclusive of separately billable procedures and treating other patients.  Critical care was necessary to treat or prevent imminent or life-threatening deterioration.  Critical care was time spent personally by me on the following activities: development of treatment plan with patient and/or surrogate as well as nursing, discussions with consultants, evaluation of patient's response to treatment, examination of patient, obtaining history from patient or surrogate, ordering and performing treatments and interventions, ordering and review of laboratory studies, ordering and review of  radiographic studies, pulse oximetry and re-evaluation of patient's condition.     EKG Interpretation  Date/Time:  Saturday July 28 2020 00:44:48 EST Ventricular Rate:  95 PR Interval:    QRS Duration: 84 QT Interval:  366 QTC Calculation: 461 R Axis:   59 Text Interpretation: Sinus rhythm Borderline ST depression, diffuse leads Abnormal T, consider  ischemia, diffuse leads similar to EKGs in October 2019 Confirmed by Pryor Curia [phone]) on 07/28/2020 1:45:56 AM          [name] was evaluated in Emergency Department on 07/28/2020 for the symptoms described in the history of present illness. She was evaluated in the context of the global COVID-19 pandemic, which necessitated consideration that the patient might be at risk for infection with the SARS-CoV-2 virus that causes COVID-19. Institutional protocols and algorithms that pertain to the evaluation of patients at risk for COVID-19 are in a state of rapid change based on information released by regulatory bodies including the CDC and federal and state organizations. These policies and algorithms were followed during the patient's care in the ED.        , Delice Bison, DO 07/28/20 [phone]

## 2020-07-28 NOTE — ED Notes (Signed)
Back to CT at this time

## 2020-07-28 NOTE — ED Notes (Signed)
Patient transported to CT 

## 2020-07-28 NOTE — ED Notes (Addendum)
Telephone consent given by son - Tommi Rumps to this RN and Dr. Leonides Schanz for lumbar puncture.

## 2020-07-28 NOTE — H&P (Signed)
History and Physical  [name] CLE:[id number] DOB: [birth year] DOA: 07/28/2020  Referring physician: Dr. Leonides Schanz, EDP  PCP: Mosie Lukes, MD  Outpatient Specialists: Cardiology, orthopedic surgery Patient coming from: Home  Chief Complaint: Altered mental status.  HPI: [name] is a 54 y.o. female with medical history significant for bilateral below the knee amputation, type 2 diabetes, CKD 3A, hypothyroidism, chronic diastolic CHF, coronary artery disease status post CABG, polyneuropathy, who presented from home via EMS due to altered mental status with concern for accidental Percocet overdose.  She received Narcan with minimal improvement.  At the time of this visit patient is confused and unable to provide a history.  History is mainly obtained from EDP and from review of medical records.  Last known well at 7:30 PM on 07/27/2020.  Upon arrival to the ED, a CT head noncontrast showed the following findings:  1. Geographic cortically based hypoattenuation situated right occipital lobe is new from comparison imaging and may reflect a subacute infarct. 2. New hypoattenuating foci in the right thalamus and basal ganglia could reflect sequela of age-indeterminate lacunar type infarction new from 2017. A similar focus in the left insula is unchanged from comparison. 3. Chronic microvascular angiopathy and parenchymal volume loss. MRI brain and MRA head ordered and pending.  EDP discussed with neurology Dr. Rory Percy, recommended MRI brain and MRA head.  TRH, hospitalist team, asked to admit.  ED Course: Afebrile, elevated BP 189/104, heart rate 105.  Lab studies remarkable for WBC 14.5K, neutrophil count 12.8  Review of Systems: Review of systems as noted in the HPI. All other systems reviewed and are negative.   Past Medical History:  Diagnosis Date  . Abscess of great toe, right   . Acute on chronic diastolic CHF (congestive heart failure) ([place]) 06/24/2018  . Acute  osteomyelitis of toe, left ([place]) 09/05/2016  . Amputation of right great toe ([place]) 12/22/2018  . Anxiety   . Cataract    left - surgery to remove  . Cellulitis of foot, right 12/11/2018  . COMMON MIGRAINE 10/07/2010  . Coronary artery disease   . Decreased visual acuity 11/10/2016  . Depression 12/18/2012  . Diabetes mellitus type II, uncontrolled ([place]) 10/07/2010   Qualifier: Diagnosis of  By: Charlett Blake MD, [name]    . Diabetic foot infection ([place]) 08/26/2016  . Diabetic infection of right foot ([place])   . Disturbance of skin sensation 10/07/2010  . Gangrene of right foot ([place])   . GERD (gastroesophageal reflux disease)   . Heart murmur   . History of kidney stones    "years ago" - passed stones  . Hyperlipidemia 12/06/2010  . Hypertension   . Hypothyroidism   . Lipoma of abdominal wall 10/05/2016  . Overweight(278.02) 12/06/2010  . PERIPHERAL NEUROPATHY, FEET 10/07/2010   11/17/2019  . PVD (peripheral vascular disease) ([place]) 01/21/2012  . RESTLESS LEG SYNDROME 10/25/2010  . Stroke [facility]) 2014, 2017   most recently in 2/17 - intracerebral hemorrhage   Past Surgical History:  Procedure Laterality Date  . AMPUTATION Right 12/13/2018   Procedure: AMPUTATION RIGHT GREAT TOE, LOCAL RELOCATION OF TISSUE FOR WOUND CLOSURE 9cm x 3cm, VAC APPLICATION;  Surgeon: [name], MD;  Location: [place];  Service: Orthopedics;  Laterality: Right;  . AMPUTATION Right 12/31/2018   Procedure: RIGHT BELOW KNEE AMPUTATION;  Surgeon: [name], MD;  Location: [place];  Service: Orthopedics;  Laterality: Right;  . AMPUTATION Left 09/21/2019   Procedure: LEFT BELOW KNEE AMPUTATION;  Surgeon: [name], MD;  Location: [place];  Service: Orthopedics;  Laterality: Left;  . AMPUTATION TOE Left 09/09/2016   Procedure: AMPUTATION OF LEFT GREAT TOE;  Surgeon: Milly Jakob, MD;  Location: [place];  Service: Orthopedics;  Laterality: Left;  . BELOW KNEE LEG AMPUTATION Left 09/21/2019  . CARDIAC  CATHETERIZATION  06/24/2018  . CORONARY ARTERY BYPASS GRAFT N/A 06/28/2018   Procedure: CORONARY ARTERY BYPASS GRAFTING (CABG) times  four, using left internal mammary artery, endoscopically harvested right saphenous vein, and harvested left radial artery;  Surgeon: [name], MD;  Location: [place];  Service: Open Heart Surgery;  Laterality: N/A;  . ENDOVEIN HARVEST OF GREATER SAPHENOUS VEIN Right 06/28/2018   Procedure: ENDOVEIN HARVEST OF GREATER SAPHENOUS VEIN;  Surgeon: [name], MD;  Location: [place];  Service: Open Heart Surgery;  Laterality: Right;  . EYE SURGERY Left 06/2017   [facility]  . LEFT HEART CATH AND CORONARY ANGIOGRAPHY N/A 06/24/2018   Procedure: LEFT HEART CATH AND CORONARY ANGIOGRAPHY;  Surgeon: Troy Sine, MD;  Location: [place] CV [facility];  Service: Cardiovascular;  Laterality: N/A;  . RADIAL ARTERY HARVEST Left 06/28/2018   Procedure: RADIAL ARTERY HARVEST;  Surgeon: [name], MD;  Location: [place];  Service: Open Heart Surgery;  Laterality: Left;  . STUMP REVISION Left 11/18/2019   Procedure: REVISION LEFT BELOW KNEE AMPUTATION;  Surgeon: [name], MD;  Location: [place];  Service: Orthopedics;  Laterality: Left;  . TEE WITHOUT CARDIOVERSION N/A 06/28/2018   Procedure: TRANSESOPHAGEAL ECHOCARDIOGRAM (TEE);  Surgeon: [name], MD;  Location: [place];  Service: Open Heart Surgery;  Laterality: N/A;  . WISDOM TOOTH EXTRACTION      Social History:  reports that she quit smoking about 14 years ago. She smoked 0.50 packs per day. She has never used smokeless tobacco. She reports previous drug use. She reports that she does not drink alcohol.   Allergies  Allergen Reactions  . Lyrica [Pregabalin] Other (See Comments)    MADE PATIENT VERY EMOTIONAL AND WOULD CRY EASILY  . Morphine And Related Nausea And Vomiting  . Penicillins Other (See Comments)    "Made patient feel sick" Did it involve swelling of the  face/tongue/throat, SOB, or low BP? No Did it involve sudden or severe rash/hives, skin peeling, or any reaction on the inside of your mouth or nose? No Did you need to seek medical attention at a hospital or doctor's office? No When did it last happen?April 2020 If all above answers are "NO", may proceed with cephalosporin use.   . Tramadol Nausea And Vomiting    Pt cant tolerate this pain med.     Family History  Problem Relation Age of Onset  . Arthritis Mother   . Stroke Brother        previous smoker  . Alcohol abuse Brother        in remission  . Leukemia Brother   . Diabetes Paternal Grandmother   . Healthy Son     Prior to Admission medications   Medication Sig Start Date End Date Taking? Authorizing Provider  acetaminophen (TYLENOL) 325 MG tablet Take 2 tablets (650 mg total) by mouth every 6 (six) hours as needed for mild pain or headache. 07/06/18   Lars Pinks M, PA-C  Amino Acids-Protein Hydrolys (FEEDING SUPPLEMENT, PRO-STAT 64,) LIQD Take 30 mLs by mouth 3 (three) times daily with meals. 07/04/20   Suzan Slick, NP  amLODipine (  NORVASC) 10 MG tablet TAKE 1 TABLET BY MOUTH EVERY DAY 02/10/20   Saguier, Percell Miller, PA-C  atorvastatin (LIPITOR) 80 MG tablet Take 1 tablet (80 mg total) by mouth daily at 6 PM. 10/11/19   Angiulli, Lavon Paganini, PA-C  azelastine (ASTELIN) 0.1 % nasal spray Place 2 sprays into both nostrils 2 (two) times daily. Use in each nostril as directed 01/23/20   Mosie Lukes, MD  blood glucose meter kit and supplies Dispense based on patient and insurance preference. Use up to four times daily as directed. DX E11.59 11/25/19   Mosie Lukes, MD  docusate sodium (COLACE) 100 MG capsule Take 1 capsule (100 mg total) by mouth 2 (two) times daily. 01/13/19   Angiulli, Lavon Paganini, PA-C  escitalopram (LEXAPRO) 10 MG tablet TAKE 1 TABLET BY MOUTH EVERY DAY 05/18/20   Mosie Lukes, MD  ferrous sulfate 325 (65 FE) MG EC tablet TAKE 1 TABLET BY MOUTH EVERY  DAY WITH BREAKFAST 03/26/20   Saguier, Percell Miller, PA-C  fluconazole (DIFLUCAN) 150 MG tablet Take 1 tablet (150 mg total) by mouth once a week. For the next 4-8 weeks. 05/04/20   Mosie Lukes, MD  HYDROcodone-acetaminophen (NORCO/VICODIN) 5-325 MG tablet Take 1 tablet by mouth every 6 (six) hours as needed for moderate pain. 04/13/20   Persons, Bevely Palmer, PA  insulin aspart (NOVOLOG FLEXPEN) 100 UNIT/ML FlexPen Inject 4 Units into the skin 3 (three) times daily with meals. 02/03/20   Mosie Lukes, MD  insulin glargine (LANTUS) 100 UNIT/ML Solostar Pen 27 units SQ qhs and then increase by 2 units every 3 days if no sugars below 150 02/12/20   Mosie Lukes, MD  Insulin Pen Needle (PEN NEEDLES 31GX5/16") 31G X 8 MM MISC Use as directed with Lantus and Novolog pen 12/02/19   Mosie Lukes, MD  levothyroxine (SYNTHROID) 25 MCG tablet TAKE 1 TABLET (25 MCG TOTAL) BY MOUTH DAILY BEFORE BREAKFAST. Patient taking differently: Take 25 mcg by mouth daily before breakfast.  01/13/19   Angiulli, Lavon Paganini, PA-C  losartan (COZAAR) 25 MG tablet TAKE 1 TABLET (25 MG TOTAL) BY MOUTH DAILY 03/22/20   Saguier, Percell Miller, PA-C  metFORMIN (GLUCOPHAGE) 1000 MG tablet Take 1 tablet (1,000 mg total) by mouth 2 (two) times daily with a meal. 11/25/19   Carollee Herter, Alferd Apa, DO  methocarbamol (ROBAXIN) 500 MG tablet Take 1 tablet (500 mg total) by mouth every 6 (six) hours as needed for muscle spasms. 10/11/19   Angiulli, Lavon Paganini, PA-C  metoCLOPramide (REGLAN) 5 MG tablet Take 1 tablet (5 mg total) by mouth 4 (four) times daily -  before meals and at bedtime. 01/13/19   Angiulli, Lavon Paganini, PA-C  Multiple Vitamin (MULTIVITAMIN WITH MINERALS) TABS tablet Take 1 tablet by mouth daily. 01/14/19   Angiulli, Lavon Paganini, PA-C  nystatin cream (MYCOSTATIN) Apply 1 application topically 2 (two) times daily. 03/30/20   Mosie Lukes, MD  oxyCODONE-acetaminophen (PERCOCET) 5-325 MG tablet Take 1 tablet by mouth 2 (two) times daily as needed for severe  pain. 07/10/20 07/10/21  Suzan Slick, NP  pantoprazole (PROTONIX) 40 MG tablet Take 1 tablet (40 mg total) by mouth daily. 10/11/19   Angiulli, Lavon Paganini, PA-C  polyethylene glycol (MIRALAX / GLYCOLAX) 17 g packet Take 17 g by mouth daily. 10/12/19   AngiulliLavon Paganini, PA-C    Physical Exam: BP (!) 170/89   Pulse 99   Temp (S) (!) 97 F (36.1 C) (Rectal)  Resp 12   Ht 5' 2"  (1.575 m)   Wt 60 kg   LMP 02/12/2011 (Exact Date)   SpO2 96%   BMI 24.19 kg/m   . General: 54 y.o. year-old female well developed well nourished in no acute distress.  Alert and confused. . Cardiovascular: Regular rate and rhythm with no rubs or gallops.  No thyromegaly or JVD noted.  No lower extremity edema. 2/4 pulses in all 4 extremities. Marland Kitchen Respiratory: Clear to auscultation with no wheezes or rales. Good inspiratory effort. . Abdomen: Soft nontender nondistended with normal bowel sounds x4 quadrants. . Muskuloskeletal: B/L BKA . Neuro: CN II-XII intact, strength, sensation, reflexes . Skin: No ulcerative lesions noted or rashes . Psychiatry: Judgement and insight appear altered. Mood is appropriate for condition and setting          Labs on Admission:  Basic Metabolic Panel: Recent Labs  Lab 07/28/20 0150  NA 140  K 4.0  CL 100  CO2 29  GLUCOSE 113*  BUN 16  CREATININE 1.17*  CALCIUM 9.3   Liver Function Tests: Recent Labs  Lab 07/28/20 0150  AST 17  ALT 17  ALKPHOS 91  BILITOT 0.4  PROT 7.2  ALBUMIN 2.6*   No results for input(s): LIPASE, AMYLASE in the last 168 hours. Recent Labs  Lab 07/28/20 0150  AMMONIA 18   CBC: Recent Labs  Lab 07/28/20 0150  WBC 14.5*  NEUTROABS 12.8*  HGB 11.4*  HCT 35.8*  MCV 85.2  PLT 506*   Cardiac Enzymes: No results for input(s): CKTOTAL, CKMB, CKMBINDEX, TROPONINI in the last 168 hours.  BNP (last 3 results) No results for input(s): BNP in the last 8760 hours.  ProBNP (last 3 results) No results for input(s): PROBNP in the last 8760  hours.  CBG: Recent Labs  Lab 07/28/20 0040  GLUCAP 102*    Radiological Exams on Admission: CT Head Wo Contrast  Result Date: 07/28/2020 CLINICAL DATA:  Mental status change EXAM: CT HEAD WITHOUT CONTRAST TECHNIQUE: Contiguous axial images were obtained from the base of the skull through the vertex without intravenous contrast. COMPARISON:  CT and MRI 03/31/2016 FINDINGS: Brain: Geographic cortically based hypoattenuation situated right occipital lobe is new from comparison imaging and may reflect a subacute infarct. Additional scattered hypoattenuating foci in the left external capsule/insula, right thalamus and basal ganglia may reflect sequela more age indeterminate though likely remote lacunar type infarcts though the latter findings in the thalamus and basal ganglia are new from 2017. No evidence of acute hemorrhage, hydrocephalus, extra-axial collection, visible mass lesion or mass effect. Symmetric prominence of the ventricles, cisterns and sulci compatible with parenchymal volume loss. More confluent areas of white matter hypoattenuation are most compatible with chronic microvascular angiopathy. Vascular: Atherosclerotic calcification of the carotid siphons and intradural vertebral arteries. No hyperdense vessel. Skull: No calvarial fracture or suspicious osseous lesion. No scalp swelling or hematoma. Sinuses/Orbits: Paranasal sinuses and mastoid air cells are predominantly clear. Left lens extraction. Included orbital contents otherwise unremarkable. Other: None. IMPRESSION: 1. Geographic cortically based hypoattenuation situated right occipital lobe is new from comparison imaging and may reflect a subacute infarct. 2. New hypoattenuating foci in the right thalamus and basal ganglia could reflect sequela of age-indeterminate lacunar type infarction new from 2017. A similar focus in the left insula is unchanged from comparison. 3. Chronic microvascular angiopathy and parenchymal volume loss.  Critical Value/emergent results were called by telephone at the time of interpretation on 07/28/2020 at 3:01 am to provider KRISTEN  WARD , who verbally acknowledged these results. Electronically Signed   By: Lovena Le M.D.   On: 07/28/2020 03:01   DG Chest Portable 1 View  Result Date: 07/28/2020 CLINICAL DATA:  Altered mental status, increasing lethargy EXAM: PORTABLE CHEST 1 VIEW COMPARISON:  Radiograph 07/13/2018 FINDINGS: Low lung volumes and streaky atelectatic changes. Mild vascular congestion without features of frank edema. No consolidative opacity. No pneumothorax or visible effusion. Postsurgical changes related to prior CABG including intact and aligned sternotomy wires and multiple surgical clips projecting over the mediastinum. The aorta is calcified. The remaining cardiomediastinal contours are unremarkable. No acute osseous or soft tissue abnormality. Telemetry leads overlie the chest. IMPRESSION: 1. Low lung volumes, atelectasis 2. Mild vascular congestion without features of frank edema. 3. Prior sternotomy and CABG. Electronically Signed   By: Lovena Le M.D.   On: 07/28/2020 03:52    EKG: I independently viewed the EKG done and my findings are as followed: Sinus rhythm rate of 85.  Nonspecific ST-T changes.  Assessment/Plan Present on Admission: . AMS (altered mental status)  Active Problems:   AMS (altered mental status) Acute metabolic encephalopathy, suspect multifactorial secondary to possible subacute CVA, opiate overuse Obtain UA to rule out an active infective process, follow results CXR, no consolidative opacities.  Obtain procalcitonin level. Possible right occipital lobe subacute infarct on CT scan MRI brain and MRA head ordered by EDP at neurologist request. pending  Possible right occipital lobe subacute infarct on CT scan Follow-up MRI brain and MRA head N.p.o. until speech therapist's assessment Obtain lipid panel and A1c Obtain 2D echo Neurochecks  every 4 hours Speech/PT/OT evaluation Fall and aspiration precautions  Essential hypertension BP is not at goal Currently n.p.o. until passes swallow evaluation by speech therapist IV Lopressor scheduled when n.p.o.  Hypothyroidism Obtain TSH Since n.p.o. we will provide IV levothyroxine  GERD IV Protonix 40 mg daily  Bilateral below the knee amputation No significant changes Monitor   DVT prophylaxis: Subcu Lovenox daily  Code Status: Full code per the patient herself.  Family Communication: None at bedside.  Disposition Plan: Admit to telemetry medical.  Consults called: Neurology by EDP  Admission status: Inpatient status.   Status is: Inpatient    Dispo:  Patient From: Home  Planned Disposition: Home with Health Care Svc  Expected discharge date: 07/30/20  Medically stable for discharge: No, ongoing management of acute uncomplicated fatigue.        Kayleen Memos MD [facility] Pager [phone]  If 7PM-7AM, please contact night-coverage [email or web address] Password [facility]  07/28/2020, 4:25 AM

## 2020-07-28 NOTE — ED Notes (Signed)
Spoke to PA Rinehuls and got clarification on medication. Keppra now coming from pharm

## 2020-07-28 NOTE — Care Plan (Signed)
This 54 years old female with medical history significant for bilateral below-knee amputations, type 2 diabetes, CKD stage IIIa, hypothyroidism, chronic diastolic CHF, Coronary artery disease,  status post CABG, polyneuropathy who presents via EMS from home with altered mental status with concern for accidental Percocet overdose.  Patient received Narcan with minimal improvement.  History is obtained from ED physician and from review of medical records.  CT head noncontrast on arrival shows subacute infarct.  Neurology was consulted,  recommended stroke work-up.  MRI and MRA head. MRI: Acute ischemic nonhemorrhagic infarct involving the ventral right thalamus. She has leukocytosis without a clear source of infection and given the concomitant presence of strokes, a CNS infection as well as infective endocarditis should be ruled out.  Echocardiogram completed ,  report pending.  Continue vancomycin ceftriaxone and acyclovir. Patient continued to remain stuporous lethargic, neurology reassess the patient,  thinks patient might be having seizures, repeat CT head unchanged.  Patient is going to have a stat EEG and was started on Keppra.

## 2020-07-28 NOTE — ED Notes (Signed)
Pt still currently at MRI.

## 2020-07-28 NOTE — ED Notes (Signed)
Patient transported to MRI 

## 2020-07-28 NOTE — ED Notes (Signed)
Nurology was paged but have not heard back from them

## 2020-07-28 NOTE — ED Notes (Signed)
Just spoke to Dr. Dwyane Dee who came and assessed the Pt. He instructed me to contact Dr.Arora with neurology. Same was paged

## 2020-07-28 NOTE — ED Notes (Signed)
Spoke to the PA for neurology who contacted the MD. HE stated that the Doctor had just seen her and to hold the Lovenox until after her CT. He stated that her conditions may be caused by on-going seizures and asked me to give the [place]. However, the orders show that the Keppra is DC and to give Lovenox. Will call back for clarification.

## 2020-07-28 NOTE — Procedures (Signed)
Patient Name: [name]  MRN: [id number]  Epilepsy Attending: Lora Havens  Referring Physician/Provider: Mikey Bussing, PA Date: 07/28/2020  Duration: 26.18 mins   atient history: 55yo F with chronic right occipital infarct, acre right thalamic infarct with worsening ams. EEG to evaluate for seizure  Level of alertness: Awake, asleep  AEDs during EEG study: None  Technical aspects: This EEG study was done with scalp electrodes positioned according to the 10-20 International system of electrode placement. Electrical activity was acquired at a sampling rate of 500Hz  and reviewed with a high frequency filter of 70Hz  and a low frequency filter of 1Hz . EEG data were recorded continuously and digitally stored.   Description: No clear posterior dominant rhythm was seen. Sleep was characterized by vertex waves, sleep spindles (12 to 14 Hz), maximal frontocentral region. EEG showed continuous generalized  to 6 Hz theta as well as intermittent rhythmic rhythmic delta slowing. Hyperventilation and photic stimulation were not performed.     ABNORMALITY -Continued slow, generalized -Intermittent rhythmic delta slow, generalized  IMPRESSION: This study is suggestive of moderate diffuse encephalopathy, nonspecific etiology. No seizures or epileptiform discharges were seen throughout the recording.  Irem Stoneham Barbra Sarks

## 2020-07-28 NOTE — ED Notes (Signed)
Back to room at this time.

## 2020-07-28 NOTE — Progress Notes (Signed)
Pharmacy Antibiotic Note  [name] is a 54 y.o. female admitted on 07/28/2020 with AMS, possible meningitis.  Pharmacy has been consulted for antibiotic dosing.  Chart notes penicillin allergy, but appears to be GI upset related to Augmentin.  Will update allergy list.    Plan: Vancomycin 1500 mg IV now, then 500 mg IV q12h Rocephin 2 g IV q12h Ampicillin 2 g IV q6h Acyclovir 600 mg IV q12h  Height: 5\' 2"  (157.5 cm) Weight: 60 kg (132 lb 4.4 oz) IBW/kg (Calculated) : 50.1  Temp (24hrs), Avg:97.4 F (36.3 C), Min:97 F (36.1 C), Max:97.8 F (36.6 C)  Recent Labs  Lab 07/28/20 0150 07/28/20 0527  WBC 14.5* 12.0*  CREATININE 1.17* 1.15*    Estimated Creatinine Clearance: 43.2 mL/min (A) (by C-G formula based on SCr of 1.15 mg/dL (H)).    Allergies  Allergen Reactions  . Lyrica [Pregabalin] Other (See Comments)    MADE PATIENT VERY EMOTIONAL AND WOULD CRY EASILY  . Morphine And Related Nausea And Vomiting  . Penicillins Other (See Comments)    "Made patient feel sick" Did it involve swelling of the face/tongue/throat, SOB, or low BP? No Did it involve sudden or severe rash/hives, skin peeling, or any reaction on the inside of your mouth or nose? No Did you need to seek medical attention at a hospital or doctor's office? No When did it last happen?April 2020 If all above answers are "NO", may proceed with cephalosporin use.   . Tramadol Nausea And Vomiting    Pt cant tolerate this pain med.      [name] 07/28/2020 6:34 AM

## 2020-07-28 NOTE — ED Notes (Signed)
Sent attending message r/t high modified NIH score

## 2020-07-28 NOTE — Consult Note (Signed)
Neurology Consultation  Reason for Consult: Altered mental status, concern for stroke Referring Physician: Dr. Cyril Mourning Ward, emergency medicine  CC: Altered mental status  History is obtained from: Chart review  HPI: [name] is a 54 y.o. female past medical history of diabetes, peripheral vascular disease status post bilateral BKA's, prior ICH with some residual left sided stiffness but no gross weakness, hypertension, hyperlipidemia, presented to the emergency room for altered mental status evaluation. Last known normal according to the ER was somewhere around 7:30 PM on 07/27/2020 when family found that she has been out of it.  She has some dysarthria at baseline according to family and son last saw the patient normal at 10:30 AM and when he got back at 10 PM, she was in the bathroom with evidence of having had urinated on herself and he could not get her to respond.  He had to shake her to wake her up to give some responses.  He found some oxycodone around her making concern for opiate overdose. Brought into the emergency room.  Nonfocal exam on initial screening. Noncontrast head CT with concern for right occipital stroke and right thalamic stroke-new from imaging of 2017. Case discussed with me over the phone. Due to nonfocal exam, recommended to follow-up the CT findings with MRI. Of note, she has leukocytosis.  Urinalysis negative.  Chest x-ray is unremarkable.  LKW: 10:30 AM on 07/27/2020 tpa given?: no, outside the window Premorbid modified Rankin scale (mRS):2-3  ROS: Unable to obtain due to altered mentation  Past Medical History:  Diagnosis Date  . Abscess of great toe, right   . Acute on chronic diastolic CHF (congestive heart failure) ([place]) 06/24/2018  . Acute osteomyelitis of toe, left ([place]) 09/05/2016  . Amputation of right great toe ([place]) 12/22/2018  . Anxiety   . Cataract    left - surgery to remove  . Cellulitis of foot, right 12/11/2018  . COMMON MIGRAINE  10/07/2010  . Coronary artery disease   . Decreased visual acuity 11/10/2016  . Depression 12/18/2012  . Diabetes mellitus type II, uncontrolled ([place]) 10/07/2010   Qualifier: Diagnosis of  By: Charlett Blake MD, [name]    . Diabetic foot infection ([place]) 08/26/2016  . Diabetic infection of right foot ([place])   . Disturbance of skin sensation 10/07/2010  . Gangrene of right foot ([place])   . GERD (gastroesophageal reflux disease)   . Heart murmur   . History of kidney stones    "years ago" - passed stones  . Hyperlipidemia 12/06/2010  . Hypertension   . Hypothyroidism   . Lipoma of abdominal wall 10/05/2016  . Overweight(278.02) 12/06/2010  . PERIPHERAL NEUROPATHY, FEET 10/07/2010   11/17/2019  . PVD (peripheral vascular disease) ([place]) 01/21/2012  . RESTLESS LEG SYNDROME 10/25/2010  . Stroke [facility]) 2014, 2017   most recently in 2/17 - intracerebral hemorrhage    Family History  Problem Relation Age of Onset  . Arthritis Mother   . Stroke Brother        previous smoker  . Alcohol abuse Brother        in remission  . Leukemia Brother   . Diabetes Paternal Grandmother   . Healthy Son      Social History:   reports that she quit smoking about 14 years ago. She smoked 0.50 packs per day. She has never used smokeless tobacco. She reports previous drug use. She reports that she does not drink alcohol.  Medications  Current Facility-Administered Medications:  .  enoxaparin (LOVENOX) injection 40 mg, 40 mg, Subcutaneous, Daily, Hall, Carole N, DO .  [START ON 07/31/2020] levothyroxine (SYNTHROID, LEVOTHROID) injection 12.5 mcg, 12.5 mcg, Intravenous, Daily, Hall, Carole N, DO .  metoprolol tartrate (LOPRESSOR) injection 2.5 mg, 2.5 mg, Intravenous, Q6H, Hall, Carole N, DO, 2.5 mg at 07/28/20 0526 .  pantoprazole (PROTONIX) injection 40 mg, 40 mg, Intravenous, Q24H, Hall, Carole N, DO, 40 mg at 07/28/20 6720  Current Outpatient Medications:  .  acetaminophen (TYLENOL) 325 MG tablet, Take 2 tablets (650  mg total) by mouth every 6 (six) hours as needed for mild pain or headache., Disp: , Rfl:  .  Amino Acids-Protein Hydrolys (FEEDING SUPPLEMENT, PRO-STAT 64,) LIQD, Take 30 mLs by mouth 3 (three) times daily with meals., Disp: 2700 mL, Rfl: 2 .  amLODipine (NORVASC) 10 MG tablet, TAKE 1 TABLET BY MOUTH EVERY DAY, Disp: 90 tablet, Rfl: 1 .  atorvastatin (LIPITOR) 80 MG tablet, Take 1 tablet (80 mg total) by mouth daily at 6 PM., Disp: 30 tablet, Rfl: 1 .  azelastine (ASTELIN) 0.1 % nasal spray, Place 2 sprays into both nostrils 2 (two) times daily. Use in each nostril as directed, Disp: 30 mL, Rfl: 3 .  blood glucose meter kit and supplies, Dispense based on patient and insurance preference. Use up to four times daily as directed. DX E11.59, Disp: 1 each, Rfl: 11 .  docusate sodium (COLACE) 100 MG capsule, Take 1 capsule (100 mg total) by mouth 2 (two) times daily., Disp: 10 capsule, Rfl: 0 .  escitalopram (LEXAPRO) 10 MG tablet, TAKE 1 TABLET BY MOUTH EVERY DAY, Disp: 90 tablet, Rfl: 1 .  ferrous sulfate 325 (65 FE) MG EC tablet, TAKE 1 TABLET BY MOUTH EVERY DAY WITH BREAKFAST, Disp: 90 tablet, Rfl: 1 .  fluconazole (DIFLUCAN) 150 MG tablet, Take 1 tablet (150 mg total) by mouth once a week. For the next 4-8 weeks., Disp: 8 tablet, Rfl: 0 .  HYDROcodone-acetaminophen (NORCO/VICODIN) 5-325 MG tablet, Take 1 tablet by mouth every 6 (six) hours as needed for moderate pain., Disp: 30 tablet, Rfl: 0 .  insulin aspart (NOVOLOG FLEXPEN) 100 UNIT/ML FlexPen, Inject 4 Units into the skin 3 (three) times daily with meals., Disp: 15 mL, Rfl: 5 .  insulin glargine (LANTUS) 100 UNIT/ML Solostar Pen, 27 units SQ qhs and then increase by 2 units every 3 days if no sugars below 150, Disp: 15 mL, Rfl: 3 .  Insulin Pen Needle (PEN NEEDLES 31GX5/16") 31G X 8 MM MISC, Use as directed with Lantus and Novolog pen, Disp: 100 each, Rfl: 5 .  levothyroxine (SYNTHROID) 25 MCG tablet, TAKE 1 TABLET (25 MCG TOTAL) BY MOUTH DAILY  BEFORE BREAKFAST. (Patient taking differently: Take 25 mcg by mouth daily before breakfast. ), Disp: 90 tablet, Rfl: 1 .  losartan (COZAAR) 25 MG tablet, TAKE 1 TABLET (25 MG TOTAL) BY MOUTH DAILY, Disp: 90 tablet, Rfl: 1 .  metFORMIN (GLUCOPHAGE) 1000 MG tablet, Take 1 tablet (1,000 mg total) by mouth 2 (two) times daily with a meal., Disp: 180 tablet, Rfl: 3 .  methocarbamol (ROBAXIN) 500 MG tablet, Take 1 tablet (500 mg total) by mouth every 6 (six) hours as needed for muscle spasms., Disp: 60 tablet, Rfl: 0 .  metoCLOPramide (REGLAN) 5 MG tablet, Take 1 tablet (5 mg total) by mouth 4 (four) times daily -  before meals and at bedtime., Disp: 90 tablet, Rfl: 0 .  Multiple Vitamin (MULTIVITAMIN WITH MINERALS) TABS tablet, Take 1 tablet by  mouth daily., Disp: , Rfl:  .  nystatin cream (MYCOSTATIN), Apply 1 application topically 2 (two) times daily., Disp: 30 g, Rfl: 2 .  oxyCODONE-acetaminophen (PERCOCET) 5-325 MG tablet, Take 1 tablet by mouth 2 (two) times daily as needed for severe pain., Disp: 20 tablet, Rfl: 0 .  pantoprazole (PROTONIX) 40 MG tablet, Take 1 tablet (40 mg total) by mouth daily., Disp: 30 tablet, Rfl: 0 .  polyethylene glycol (MIRALAX / GLYCOLAX) 17 g packet, Take 17 g by mouth daily., Disp: 14 each, Rfl: 0   Exam: Current vital signs: BP (!) 157/81 (BP Location: Right Arm)   Pulse (!) 109   Temp (S) (!) 97 F (36.1 C) (Rectal)   Resp 15   Ht 5' 2"  (1.575 m)   Wt 60 kg   LMP 02/12/2011 (Exact Date)   SpO2 93%   BMI 24.19 kg/m  Vital signs in last 24 hours: Temp:  [97 F (36.1 C)-97.8 F (36.6 C)] 97 F (36.1 C) (11/20 0259) Pulse Rate:  [93-109] 109 (11/20 0525) Resp:  [12-15] 15 (11/20 0525) BP: (156-198)/(81-104) 157/81 (11/20 0525) SpO2:  [93 %-99 %] 93 % (11/20 0525) Weight:  [60 kg] 60 kg (11/20 0037) General: Received some Ativan for imaging studies, very somnolent. HEENT: Normocephalic, atraumatic with some dried vomitus in the lip angles. CVS: Regular  rhythm Respiratory: Protecting her airway, saturating normally on room air, distant breath sounds bilaterally Extremities: BKA Neurological exam Extremely somnolent, barely opens eyes to noxious stimulation. Does not follow commands Cranial nerves: Pupils equal round reactive light, extraocular movements difficult to assess but no forced gaze deviation or preference, facial symmetry is preserved. Motor exam: Withdraws all 4 to noxious stimulation equally. Sensory exam: As above NIH stroke scale 1a Level of Conscious.: 2 1b LOC Questions: 2 1c LOC Commands: 2 2 Best Gaze: 0 3 Visual: 0 4 Facial Palsy: 0 5a Motor Arm - left: 2 5b Motor Arm - Right: 2 6a Motor Leg - Left: 2 6b Motor Leg - Right: 2 7 Limb Ataxia: 0 8 Sensory: 0 9 Best Language: 2 10 Dysarthria: 2 11 Extinct. and Inatten.: 0 TOTAL: 18   Labs I have reviewed labs in epic and the results pertinent to this consultation are:  CBC    Component Value Date/Time   WBC 14.5 (H) 07/28/2020 0150   RBC 4.20 07/28/2020 0150   HGB 11.4 (L) 07/28/2020 0150   HCT 35.8 (L) 07/28/2020 0150   PLT 506 (H) 07/28/2020 0150   MCV 85.2 07/28/2020 0150   MCH 27.1 07/28/2020 0150   MCHC 31.8 07/28/2020 0150   RDW 14.0 07/28/2020 0150   LYMPHSABS 0.9 07/28/2020 0150   MONOABS 0.6 07/28/2020 0150   EOSABS 0.1 07/28/2020 0150   BASOSABS 0.1 07/28/2020 0150    CMP     Component Value Date/Time   NA 140 07/28/2020 0150   K 4.0 07/28/2020 0150   CL 100 07/28/2020 0150   CO2 29 07/28/2020 0150   GLUCOSE 113 (H) 07/28/2020 0150   BUN 16 07/28/2020 0150   CREATININE 1.17 (H) 07/28/2020 0150   CREATININE 0.56 02/09/2014 0839   CALCIUM 9.3 07/28/2020 0150   PROT 7.2 07/28/2020 0150   ALBUMIN 2.6 (L) 07/28/2020 0150   AST 17 07/28/2020 0150   ALT 17 07/28/2020 0150   ALKPHOS 91 07/28/2020 0150   BILITOT 0.4 07/28/2020 0150   GFRNONAA 55 (L) 07/28/2020 0150   GFRAA 40 (L) 10/11/2019 0528    Imaging I have  reviewed the  images obtained:  CT-scan of the brain-formal read below.  I think that the lacunar infarction in the thalamus is acute and the other ones might be chronic. IMPRESSION: 1. Geographic cortically based hypoattenuation situated right occipital lobe is new from comparison imaging and may reflect a subacute infarct. 2. New hypoattenuating foci in the right thalamus and basal ganglia could reflect sequela of age-indeterminate lacunar type infarction new from 2017. A similar focus in the left insula is unchanged from comparison. 3. Chronic microvascular angiopathy and parenchymal volume loss.  MRI examination of the brain-official read pending.  Preliminary discussion with the neuroradiologist reveals ventral thalamic acute lacunar infarction with some dorsal thalamic as well as periatrial areas of restricted diffusion that are likely subacute strokes with the right PCA territory hypodensity seen on CT likely chronic infarction.  No evidence on noncontrast imaging to suggest any sort of infectious debris in the ventricles. MRA head with significant right P2 stenosis amongst multifocal intracranial stenoses.  Assessment: 54 year old past history of diabetes, peripheral vascular disease status post bilateral BKA's, prior ICH with some left-sided residual stiffness and no gross weakness but some residual dysarthria, hypertension, hyperlipidemia presented for evaluation of altered mental status. Outside the window for IV TPA CT scan concerning for new strokes compared to prior scan of 2017, which was followed up with the MRI which shows lacunar infarct in the thalamus and some subacute posterior circulation strokes.  Likely secondary to stenosis in the right P2 and possible hypoperfusion in the setting of systemic hypertension. She has a leukocytosis without a clear source of infection and given the concomitant presence of strokes, a CNS infection as well as infective endocarditis should be ruled  out.   Impression: Acute ischemic stroke-likely secondary to large vessel disease involving the PCA and hypoperfusion Evaluate for CNS infection Evaluate for endocarditis  Recommendations: -Stroke work-up-admit to hospitalist, telemetry, frequent neurochecks, 2D echocardiogram, A1c, lipid panel, carotid Dopplers. -No clear evidence of mycotic aneurysms on MRA head but I will hold off on anticoagulation until infective endocarditis is ruled out. -Can use aspirin 81 for now. -I have discussed with Dr. Leonides Schanz who would be attempting a spinal tap in the emergency room at bedside-CSF to be sent for cell count, glucose, protein, Gram stain, HSV PCR. In the interim, broad antibiotic coverage with vancomycin and ceftriaxone.  I will also cover for HSV encephalitis with acyclovir. -Blood cultures -Might need TEE  Discussed my plan with Dr. Nevada Crane, admitting hospitalist. Also discussed with Dr Leonides Schanz - EDP -- Amie Portland, MD [facility] Pager: [phone] If 7pm to 7am, please call on call as listed on AMION.

## 2020-07-28 NOTE — ED Notes (Signed)
MRI staff called this RN, pt unable to keep still, requesting for medication, Dr. Leonides Schanz made aware with orders made. Walked ativan 1mg  down to MRI.

## 2020-07-28 NOTE — Progress Notes (Signed)
SLP Cancellation Note  Patient Details Name: CORBIN HOTT MRN: [id number] DOB: 08/22/1966   Cancelled treatment:       Reason Eval/Treat Not Completed: Patient's level of consciousness. Per chart, pt poorly responsive. Will f/u as arousal improves.    Nyonna Hargrove, Katherene Ponto 07/28/2020, 7:52 AM

## 2020-07-29 ENCOUNTER — Inpatient Hospital Stay (HOSPITAL_COMMUNITY): Payer: BC Managed Care – PPO

## 2020-07-29 DIAGNOSIS — I639 Cerebral infarction, unspecified: Secondary | ICD-10-CM | POA: Diagnosis not present

## 2020-07-29 DIAGNOSIS — I6389 Other cerebral infarction: Secondary | ICD-10-CM

## 2020-07-29 LAB — CBC WITH DIFFERENTIAL/PLATELET
Abs Immature Granulocytes: 0.01 10*3/uL (ref 0.00–0.07)
Basophils Absolute: 0.1 10*3/uL (ref 0.0–0.1)
Basophils Relative: 1 %
Eosinophils Absolute: 0.5 10*3/uL (ref 0.0–0.5)
Eosinophils Relative: 5 %
HCT: 35.1 % — ABNORMAL LOW (ref 36.0–46.0)
Hemoglobin: 11.1 g/dL — ABNORMAL LOW (ref 12.0–15.0)
Immature Granulocytes: 0 %
Lymphocytes Relative: 20 %
Lymphs Abs: 1.9 10*3/uL (ref 0.7–4.0)
MCH: 27.5 pg (ref 26.0–34.0)
MCHC: 31.6 g/dL (ref 30.0–36.0)
MCV: 87.1 fL (ref 80.0–100.0)
Monocytes Absolute: 0.8 10*3/uL (ref 0.1–1.0)
Monocytes Relative: 8 %
Neutro Abs: 6.6 10*3/uL (ref 1.7–7.7)
Neutrophils Relative %: 66 %
Platelets: 490 10*3/uL — ABNORMAL HIGH (ref 150–400)
RBC: 4.03 MIL/uL (ref 3.87–5.11)
RDW: 14.3 % (ref 11.5–15.5)
WBC: 9.9 10*3/uL (ref 4.0–10.5)
nRBC: 0 % (ref 0.0–0.2)

## 2020-07-29 LAB — COMPREHENSIVE METABOLIC PANEL
ALT: 17 U/L (ref 0–44)
AST: 19 U/L (ref 15–41)
Albumin: 2.1 g/dL — ABNORMAL LOW (ref 3.5–5.0)
Alkaline Phosphatase: 69 U/L (ref 38–126)
Anion gap: 9 (ref 5–15)
BUN: 14 mg/dL (ref 6–20)
CO2: 26 mmol/L (ref 22–32)
Calcium: 8.7 mg/dL — ABNORMAL LOW (ref 8.9–10.3)
Chloride: 105 mmol/L (ref 98–111)
Creatinine, Ser: 1.31 mg/dL — ABNORMAL HIGH (ref 0.44–1.00)
GFR, Estimated: 48 mL/min — ABNORMAL LOW (ref >60)
Glucose, Bld: 128 mg/dL — ABNORMAL HIGH (ref 70–99)
Potassium: 4.4 mmol/L (ref 3.5–5.1)
Sodium: 140 mmol/L (ref 135–145)
Total Bilirubin: 0.2 mg/dL — ABNORMAL LOW (ref 0.3–1.2)
Total Protein: 6 g/dL — ABNORMAL LOW (ref 6.5–8.1)

## 2020-07-29 LAB — GLUCOSE, CAPILLARY
Glucose-Capillary: 135 mg/dL — ABNORMAL HIGH (ref 70–99)
Glucose-Capillary: 130 mg/dL — ABNORMAL HIGH (ref 70–99)
Glucose-Capillary: 155 mg/dL — ABNORMAL HIGH (ref 70–99)
Glucose-Capillary: 153 mg/dL — ABNORMAL HIGH (ref 70–99)
Glucose-Capillary: 161 mg/dL — ABNORMAL HIGH (ref 70–99)
Glucose-Capillary: 169 mg/dL — ABNORMAL HIGH (ref 70–99)

## 2020-07-29 LAB — PHOSPHORUS: Phosphorus: 4 mg/dL (ref 2.5–4.6)

## 2020-07-29 LAB — MAGNESIUM: Magnesium: 1.7 mg/dL (ref 1.7–2.4)

## 2020-07-29 LAB — ECHOCARDIOGRAM COMPLETE BUBBLE STUDY
Area-P 1/2: 3.53 cm2
S' Lateral: 2.7 cm

## 2020-07-29 MED ORDER — VANCOMYCIN HCL 750 MG/150ML IV SOLN
750.0000 mg | INTRAVENOUS | Status: DC
  Filled 2020-07-29: qty 150

## 2020-07-29 MED ORDER — SODIUM CHLORIDE 0.9 % IV SOLN: INTRAVENOUS | Status: DC

## 2020-07-29 MED ORDER — HALOPERIDOL LACTATE 5 MG/ML IJ SOLN
INTRAMUSCULAR | Status: AC
  Administered 2020-07-29: 1 mg via INTRAMUSCULAR
  Filled 2020-07-29: qty 1

## 2020-07-29 MED ORDER — ASPIRIN 300 MG RE SUPP
300.0000 mg | Freq: Every day | RECTAL | Status: DC
  Administered 2020-07-29 – 2020-07-31 (×3): 300 mg via RECTAL
  Filled 2020-07-29 (×2): qty 1

## 2020-07-29 MED ORDER — HALOPERIDOL 0.5 MG PO TABS: 1.0000 mg | ORAL_TABLET | Freq: Four times a day (QID) | ORAL | Status: DC | PRN

## 2020-07-29 MED ORDER — HALOPERIDOL LACTATE 5 MG/ML IJ SOLN
1.0000 mg | Freq: Four times a day (QID) | INTRAMUSCULAR | Status: DC | PRN
  Administered 2020-07-29 – 2020-07-30 (×2): 1 mg via INTRAMUSCULAR
  Filled 2020-07-29 (×2): qty 1

## 2020-07-29 NOTE — Progress Notes (Signed)
Pt is currently on IV acyclovir to r/o encephalitis. Due to its potential to contribute to nephrotoxicity, ok to add NS at 37ml/hr hydration per Dr. Dwyane Dee.  Onnie Boer, PharmD, BCIDP, AAHIVP, CPP Infectious Disease Pharmacist 07/29/2020 8:22 AM

## 2020-07-29 NOTE — Progress Notes (Signed)
Pharmacy Antibiotic Note  [name] is a 54 y.o. female admitted on 07/28/2020 with AMS, possible meningitis.  Pharmacy has been consulted for antibiotic dosing.  Chart notes penicillin allergy, but appears to be GI upset related to Augmentin.  Will update allergy list.     LP doesn't suggest bacterial meningitis. ECHO is pending to r/o endocarditis. Scr had a slight bump this AM. We will adjust IV vanc for now.  Plan: Change vanc to 750mg  IV q24 Rocephin 2 g IV q12h Ampicillin 2 g IV q6h Acyclovir 600 mg IV q12h  Height: 5\' 2"  (157.5 cm) Weight: 60 kg (132 lb 4.4 oz) IBW/kg (Calculated) : 50.1  Temp (24hrs), Avg:98.1 F (36.7 C), Min:97.6 F (36.4 C), Max:98.7 F (37.1 C)  Recent Labs  Lab 07/28/20 0150 07/28/20 0527 07/29/20 0152  WBC 14.5* 12.0* 9.9  CREATININE 1.17* 1.15* 1.31*    Estimated Creatinine Clearance: 37.9 mL/min (A) (by C-G formula based on SCr of 1.31 mg/dL (H)).    Allergies  Allergen Reactions  . Lyrica [Pregabalin] Other (See Comments)    MADE PATIENT VERY EMOTIONAL AND WOULD CRY EASILY  . Morphine And Related Nausea And Vomiting  . Penicillins Other (See Comments)    GI upset related to Augmentin use  Did it involve swelling of the face/tongue/throat, SOB, or low BP? No Did it involve sudden or severe rash/hives, skin peeling, or any reaction on the inside of your mouth or nose? No Did you need to seek medical attention at a hospital or doctor's office? No When did it last happen?April 2020 If all above answers are "NO", may proceed with cephalosporin use.   . Tramadol Nausea And Vomiting    Pt cant tolerate this pain med.      Onnie Boer, PharmD, BCIDP, AAHIVP, CPP Infectious Disease Pharmacist 07/29/2020 8:07 AM

## 2020-07-29 NOTE — Plan of Care (Signed)
  Problem: Education: Goal: Knowledge of disease or condition will improve Outcome: Progressing Goal: Knowledge of secondary prevention will improve Outcome: Progressing Goal: Knowledge of patient specific risk factors addressed and post discharge goals established will improve Outcome: Progressing Goal: Individualized Educational Video(s) Outcome: Progressing   Problem: Coping: Goal: Will verbalize positive feelings about self Outcome: Progressing Goal: Will identify appropriate support needs Outcome: Progressing   Problem: Health Behavior/Discharge Planning: Goal: Ability to manage health-related needs will improve Outcome: Progressing   Problem: Self-Care: Goal: Ability to participate in self-care as condition permits will improve Outcome: Progressing

## 2020-07-29 NOTE — Progress Notes (Signed)
PROGRESS NOTE    [name]  LFY:[id number] DOB: [birth year] DOA: 07/28/2020 PCP: Mosie Lukes, MD    Brief Narrative:  This 54 years old female with medical history significant for bilateral below-knee amputations, type 2 diabetes, CKD stage IIIa, hypothyroidism, chronic diastolic CHF, Coronary artery disease,  status post CABG, polyneuropathy who presents via EMS from home with altered mental status with concern for accidental Percocet overdose.  Patient received Narcan with minimal improvement.  History is obtained from ED physician and from review of medical records.  CT head noncontrast on arrival shows subacute infarct.  Neurology was consulted,  recommended stroke work-up.  MRI and MRA head. MRI: Acute ischemic nonhemorrhagic infarct involving the ventral right thalamus. She has leukocytosis without a clear source of infection and given the concomitant presence of strokes, a CNS infection as well as infective endocarditis should be ruled out.  Echocardiogram completed ,  No vegetations.  Continue vancomycin ceftriaxone and acyclovir. Patient continued to remain stuporous lethargic, neurology reassess the patient,  thinks patient might be having seizures, repeat CT head unchanged. Patient has stat EEG  : no evidence of seizures , started on Keppra.   Assessment & Plan:   Active Problems:   AMS (altered mental status)   Cerebral thrombosis with cerebral infarction   Acute metabolic encephalopathy could be multifactorial: Differential could be secondary to possible subacute CVA, opiate overuse. Patient received Narcan with minimal improvement.  UA : unremarkable, procalcitonin 0.10 CXR, no consolidative opacities.  Possible right occipital lobe subacute infarct on CT scan MRI brain and MRA head : Acute ischemic nonhemorrhagic infarct involving the ventral right thalamus. She has leukocytosis without a clear source of infection and given the concomitant presence of strokes, a CNS  infection as well as infective endocarditis should be ruled out.   Echocardiogram completed ,  No vegetations. Patient underwent spinal tap, CSF clear, no WBCs, cultures pending. Continue vancomycin, ceftriaxone and acyclovir for now.  Possible right occipital lobe subacute infarct on CT scan: Stroke pathway MRI brain and MRA head : Acute ischemic nonhemorrhagic infarct involving the ventral right thalamus. 2D echocardiogram : LVEF 55 to 60%. Neurochecks every 4 hours Speech/PT/OT evaluation Fall and aspiration precautions. Neurology thinks patient might be having a seizures.  Started on [place] EEG no evidence of seizures.  Essential hypertension Blood pressure has improved. Resume home blood pressure medications IV Lopressor as needed  Hypothyroidism TSH normal IV levothyroxine when n.p.o.  GERD IV Protonix 40 mg daily  Bilateral below the knee amputation No significant changes. Monitor   DVT prophylaxis:  Lovenox Code Status: Full Family Communication: No one at bed side. Disposition Plan:   Status is: Inpatient  Remains inpatient appropriate because:Inpatient level of care appropriate due to severity of illness   Dispo:  Patient From: Home  Planned Disposition: Home with Health Care Svc vs SNF  Expected discharge date:   >3 DAYS                                                 Medically stable for discharge: No   Consultants:   Neurology   Procedures: Antimicrobials:  Anti-infectives (From admission, onward)   Start     Dose/Rate Route Frequency Ordered Stop   07/29/20 2000  vancomycin (VANCOREADY) IVPB 750 mg/150 mL        750 mg  150 mL/hr over 60 Minutes Intravenous Every 24 hours 07/29/20 0808     07/28/20 2200  vancomycin (VANCOREADY) IVPB 500 mg/100 mL  Status:  Discontinued        500 mg 100 mL/hr over 60 Minutes Intravenous Every 12 hours 07/28/20 0644 07/29/20 0808   07/28/20 2000  cefTRIAXone (ROCEPHIN) 2 g in sodium chloride 0.9 % 100  mL IVPB        2 g 200 mL/hr over 30 Minutes Intravenous Every 12 hours 07/28/20 0644     07/28/20 2000  acyclovir (ZOVIRAX) 600 mg in dextrose 5 % 100 mL IVPB        600 mg 112 mL/hr over 60 Minutes Intravenous Every 12 hours 07/28/20 0644     07/28/20 1400  ampicillin (OMNIPEN) 2 g in sodium chloride 0.9 % 100 mL IVPB        2 g 300 mL/hr over 20 Minutes Intravenous Every 6 hours 07/28/20 0644     07/28/20 0700  vancomycin (VANCOREADY) IVPB 1500 mg/300 mL        1,500 mg 150 mL/hr over 120 Minutes Intravenous  Once 07/28/20 0621 07/28/20 1051   07/28/20 0700  acyclovir (ZOVIRAX) 600 mg in dextrose 5 % 100 mL IVPB        600 mg 112 mL/hr over 60 Minutes Intravenous  Once 07/28/20 0621 07/28/20 0913   07/28/20 0630  cefTRIAXone (ROCEPHIN) 2 g in sodium chloride 0.9 % 100 mL IVPB        2 g 200 mL/hr over 30 Minutes Intravenous  Once 07/28/20 0621 07/28/20 0804   07/28/20 0630  ampicillin (OMNIPEN) 2 g in sodium chloride 0.9 % 100 mL IVPB        2 g 300 mL/hr over 20 Minutes Intravenous  Once 07/28/20 8325 07/28/20 0842     Subjective: Patient was seen and examined at bedside.  Overnight events noted.  Patient  is slightly more improved than yesterday, opens eyes, responds yes and no answers.  Objective: Vitals:   07/29/20 0001 07/29/20 0406 07/29/20 0604 07/29/20 1200  BP: (!) 143/75 (!) 160/95 128/69 135/70  Pulse: 72 90 75 80  Resp: 18   10  Temp: 97.9 F (36.6 C) 97.6 F (36.4 C)  98 F (36.7 C)  TempSrc: Oral Oral  Oral  SpO2: 98% 99% 97% 98%  Weight:      Height:        Intake/Output Summary (Last 24 hours) at 07/29/2020 1448 Last data filed at 07/29/2020 0600 Gross per 24 hour  Intake 702.13 ml  Output 1 ml  Net 701.13 ml   Filed Weights   07/28/20 0037  Weight: 60 kg    Examination:  General exam: Appears calm and comfortable, not in distress Respiratory system: Clear to auscultation. Respiratory effort normal. Cardiovascular system: S1 & S2 heard,  RRR. No JVD, murmurs, rubs, gallops or clicks. No pedal edema. Gastrointestinal system: Abdomen is nondistended, soft and nontender. No organomegaly or masses felt. Normal bowel sounds heard. Central nervous system: Alert and oriented. No focal neurological deficits. Extremities: Bilateral below-knee amputation Skin: No rashes, lesions or ulcers Psychiatry: Judgement and insight appear normal. Mood & affect appropriate.     Data Reviewed: I have personally reviewed following labs and imaging studies  CBC: Recent Labs  Lab 07/28/20 0150 07/28/20 0527 07/29/20 0152  WBC 14.5* 12.0* 9.9  NEUTROABS 12.8* 10.2* 6.6  HGB 11.4* 10.9* 11.1*  HCT 35.8* 34.2* 35.1*  MCV 85.2 86.8 87.1  PLT 506* 476* 578*   Basic Metabolic Panel: Recent Labs  Lab 07/28/20 0150 07/28/20 0527 07/29/20 0152  NA 140 139 140  K 4.0 4.3 4.4  CL 100 103 105  CO2 29 27 26   GLUCOSE 113* 228* 128*  BUN 16 16 14   CREATININE 1.17* 1.15* 1.31*  CALCIUM 9.3 8.8* 8.7*  MG  --   --  1.7  PHOS  --   --  4.0   GFR: Estimated Creatinine Clearance: 37.9 mL/min (A) (by C-G formula based on SCr of 1.31 mg/dL (H)). Liver Function Tests: Recent Labs  Lab 07/28/20 0150 07/29/20 0152  AST 17 19  ALT 17 17  ALKPHOS 91 69  BILITOT 0.4 0.2*  PROT 7.2 6.0*  ALBUMIN 2.6* 2.1*   No results for input(s): LIPASE, AMYLASE in the last 168 hours. Recent Labs  Lab 07/28/20 0150  AMMONIA 18   Coagulation Profile: No results for input(s): INR, PROTIME in the last 168 hours. Cardiac Enzymes: No results for input(s): CKTOTAL, CKMB, CKMBINDEX, TROPONINI in the last 168 hours. BNP (last 3 results) No results for input(s): PROBNP in the last 8760 hours. HbA1C: Recent Labs    07/28/20 0527  HGBA1C 10.5*   CBG: Recent Labs  Lab 07/28/20 2001 07/28/20 2358 07/29/20 0401 07/29/20 0950 07/29/20 1215  GLUCAP 126* 116* 135* 130* 155*   Lipid Profile: Recent Labs    07/28/20 0527  CHOL 271*  HDL 48  LDLCALC  199*  TRIG 118  CHOLHDL 5.6   Thyroid Function Tests: Recent Labs    07/28/20 0527  TSH 4.535*   Anemia Panel: No results for input(s): VITAMINB12, FOLATE, FERRITIN, TIBC, IRON, RETICCTPCT in the last 72 hours. Sepsis Labs: Recent Labs  Lab 07/28/20 0527  PROCALCITON <0.10    Recent Results (from the past 240 hour(s))  Respiratory Panel by RT PCR (Flu A&B, Covid) - Nasopharyngeal Swab     Status: None   Collection Time: 07/28/20  3:19 AM   Specimen: Nasopharyngeal Swab; Nasopharyngeal(NP) swabs in vial transport medium  Result Value Ref Range Status   SARS Coronavirus 2 by RT PCR NEGATIVE NEGATIVE Final    Comment: (NOTE) SARS-CoV-2 target nucleic acids are NOT DETECTED.  The SARS-CoV-2 RNA is generally detectable in upper respiratoy specimens during the acute phase of infection. The lowest concentration of SARS-CoV-2 viral copies this assay can detect is 131 copies/mL. A negative result does not preclude SARS-Cov-2 infection and should not be used as the sole basis for treatment or other patient management decisions. A negative result may occur with  improper specimen collection/handling, submission of specimen other than nasopharyngeal swab, presence of viral mutation(s) within the areas targeted by this assay, and inadequate number of viral copies (<131 copies/mL). A negative result must be combined with clinical observations, patient history, and epidemiological information. The expected result is Negative.  Fact Sheet for Patients:  PinkCheek.be  Fact Sheet for Healthcare Providers:  GravelBags.it  This test is no t yet approved or cleared by the [place] FDA and  has been authorized for detection and/or diagnosis of SARS-CoV-2 by FDA under an Emergency Use Authorization (EUA). This EUA will remain  in effect (meaning this test can be used) for the duration of the COVID-19 declaration under Section  564(b)(1) of the Act, 21 U.S.C. section 360bbb-3(b)(1), unless the authorization is terminated or revoked sooner.     Influenza A by PCR NEGATIVE NEGATIVE Final   Influenza B by PCR NEGATIVE NEGATIVE Final  Comment: (NOTE) The Xpert Xpress SARS-CoV-2/FLU/RSV assay is intended as an aid in  the diagnosis of influenza from Nasopharyngeal swab specimens and  should not be used as a sole basis for treatment. Nasal washings and  aspirates are unacceptable for Xpert Xpress SARS-CoV-2/FLU/RSV  testing.  Fact Sheet for Patients: PinkCheek.be  Fact Sheet for Healthcare Providers: GravelBags.it  This test is not yet approved or cleared by the [place] FDA and  has been authorized for detection and/or diagnosis of SARS-CoV-2 by  FDA under an Emergency Use Authorization (EUA). This EUA will remain  in effect (meaning this test can be used) for the duration of the  Covid-19 declaration under Section 564(b)(1) of the Act, 21  U.S.C. section 360bbb-3(b)(1), unless the authorization is  terminated or revoked. Performed at [facility], [place] [address], [place], [place] [address]   CSF culture     Status: None (Preliminary result)   Collection Time: 07/28/20  6:06 AM   Specimen: CSF; Cerebrospinal Fluid  Result Value Ref Range Status   Specimen Description CSF  Final   Special Requests NONE  Final   Gram Stain   Final    WBC PRESENT, PREDOMINANTLY MONONUCLEAR NO ORGANISMS SEEN CYTOSPIN SMEAR    Culture   Final    NO GROWTH 1 DAY Performed at [facility], [place] [address], [place], [place] [address]    Report Status PENDING  Incomplete  Blood culture (routine x 2)     Status: None (Preliminary result)   Collection Time: 07/28/20  6:55 AM   Specimen: BLOOD RIGHT ARM  Result Value Ref Range Status   Specimen Description BLOOD RIGHT ARM  Final   Special Requests   Final    BOTTLES DRAWN AEROBIC AND ANAEROBIC  Blood Culture adequate volume   Culture   Final    NO GROWTH 1 DAY Performed at [facility], [place] [address] [place], [place] [address]    Report Status PENDING  Incomplete  Blood culture (routine x 2)     Status: None (Preliminary result)   Collection Time: 07/28/20  7:02 AM   Specimen: BLOOD RIGHT HAND  Result Value Ref Range Status   Specimen Description BLOOD RIGHT HAND  Final   Special Requests   Final    BOTTLES DRAWN AEROBIC AND ANAEROBIC Blood Culture results may not be optimal due to an inadequate volume of blood received in culture bottles   Culture   Final    NO GROWTH 1 DAY Performed at [facility], [place] [address] [place], [place] [address]    Report Status PENDING  Incomplete         Radiology Studies: CT HEAD WO CONTRAST  Result Date: 07/28/2020 CLINICAL DATA:  54 year old female with altered mental status, right thalamic lacunar infarct, subacute ischemia elsewhere in the right PCA territory on MRI this morning. Right MCA and PCA stenoses on MRA. EXAM: CT HEAD WITHOUT CONTRAST TECHNIQUE: Contiguous axial images were obtained from the base of the skull through the vertex without intravenous contrast. COMPARISON:  Brain MRI, intracranial MRA, plain head CT earlier today. FINDINGS: Brain: No midline shift, mass effect, or evidence of intracranial mass lesion. No ventriculomegaly. No acute intracranial hemorrhage identified. Patchy hypodensity in the right thalamus and elsewhere in the right PCA territory corresponding to a mix of acute, subacute, and chronic ischemia. Unchanged CT appearance from 0128 hours today. Superimposed chronic small infarcts also in the right basal ganglia/internal capsule, right  cerebellum. Vascular: Calcified atherosclerosis at the skull base. No suspicious intracranial vascular hyperdensity. Skull: No acute osseous abnormality identified. Sinuses/Orbits: Visualized paranasal sinuses and mastoids are stable. Other: No acute orbit or  scalp soft tissue finding. IMPRESSION: 1. Stable CT appearance of the brain from earlier today today with a mix of acute, subacute, and chronic ischemia in the Right PCA territory. No associated hemorrhage or mass effect. 2. Chronic small-vessel ischemia elsewhere. No new intracranial abnormality. Electronically Signed   By: Genevie Ann M.D.   On: 07/28/2020 12:25   CT Head Wo Contrast  Result Date: 07/28/2020 CLINICAL DATA:  Mental status change EXAM: CT HEAD WITHOUT CONTRAST TECHNIQUE: Contiguous axial images were obtained from the base of the skull through the vertex without intravenous contrast. COMPARISON:  CT and MRI 03/31/2016 FINDINGS: Brain: Geographic cortically based hypoattenuation situated right occipital lobe is new from comparison imaging and may reflect a subacute infarct. Additional scattered hypoattenuating foci in the left external capsule/insula, right thalamus and basal ganglia may reflect sequela more age indeterminate though likely remote lacunar type infarcts though the latter findings in the thalamus and basal ganglia are new from 2017. No evidence of acute hemorrhage, hydrocephalus, extra-axial collection, visible mass lesion or mass effect. Symmetric prominence of the ventricles, cisterns and sulci compatible with parenchymal volume loss. More confluent areas of white matter hypoattenuation are most compatible with chronic microvascular angiopathy. Vascular: Atherosclerotic calcification of the carotid siphons and intradural vertebral arteries. No hyperdense vessel. Skull: No calvarial fracture or suspicious osseous lesion. No scalp swelling or hematoma. Sinuses/Orbits: Paranasal sinuses and mastoid air cells are predominantly clear. Left lens extraction. Included orbital contents otherwise unremarkable. Other: None. IMPRESSION: 1. Geographic cortically based hypoattenuation situated right occipital lobe is new from comparison imaging and may reflect a subacute infarct. 2. New  hypoattenuating foci in the right thalamus and basal ganglia could reflect sequela of age-indeterminate lacunar type infarction new from 2017. A similar focus in the left insula is unchanged from comparison. 3. Chronic microvascular angiopathy and parenchymal volume loss. Critical Value/emergent results were called by telephone at the time of interpretation on 07/28/2020 at 3:01 am to provider [facility] , who verbally acknowledged these results. Electronically Signed   By: Lovena Le M.D.   On: 07/28/2020 03:01   MR ANGIO HEAD WO CONTRAST  Result Date: 07/28/2020 CLINICAL DATA:  Follow-up examination for acute stroke. EXAM: MRI HEAD WITHOUT CONTRAST MRA HEAD WITHOUT CONTRAST TECHNIQUE: Multiplanar, multiecho pulse sequences of the brain and surrounding structures were obtained without intravenous contrast. Angiographic images of the head were obtained using MRA technique without contrast. COMPARISON:  Prior CT from earlier the same day. FINDINGS: MRI HEAD FINDINGS Brain: Generalized age-related cerebral atrophy. Patchy and confluent T2/FLAIR hyperintensity within the periventricular deep white matter both cerebral hemispheres most consistent with chronic small vessel ischemic disease, moderate in nature. Remote lacunar infarcts noted at the right basal ganglia/corona radiata and left thalamus. Patchy involvement of the pons noted. Probable few additional remote lacunar infarcts noted about the cerebellum near the roof of the fourth ventricle (series 16, image 14). Encephalomalacia gliosis extending from the right periatrial white matter towards the overlying right parieto-occipital region consistent with right PCA territory ischemic change. Mild residual diffusion abnormality without ADC correlate, felt to be most consistent with a late subacute to chronic ischemic infarct. Similar late subacute to chronic ischemic change noted involving the right corona radiata (series 5, image 78, left genu of the  anterior corpus callosum (series 5, image 79,  and left frontal corona radiata (series 5, image 77). Associated scattered areas of susceptibility artifact could reflect hemosiderin staining and/or laminar necrosis. There is additional 7 mm focus of restricted diffusion involving the ventral right thalamus (series 5, image 73). Associated signal loss on corresponding ADC map, consistent with an acute ischemic infarct. Patchy extension towards the periventricular aspect of the third ventricle (series 5, image 71). Additional 11 mm focus of more mild diffusion signal abnormality involving the right periatrial white matter also demonstrates signal loss on corresponding ADC map (series 6, image 25), consistent with acute to subacute ischemia. Extension into the adjacent dorsal thalamus (series 5, image 71). No associated hemorrhage about these more acute infarcts. No other evidence for acute or subacute ischemia. Gray-white matter differentiation otherwise maintained. No evidence for acute intracranial hemorrhage. No mass lesion, midline shift or mass effect. Ventricles normal size without hydrocephalus. No extra-axial fluid collection. Pituitary gland suprasellar region within normal limits. Midline structures intact. Vascular: Major intracranial vascular flow voids are maintained. Asymmetric FLAIR signal intensity within the left transverse and sigmoid sinuses without associated T1 correlate favored to be related to slow/sluggish flow rather than thrombus. Skull and upper cervical spine: Craniocervical junction within normal limits. Bone marrow signal intensity normal. No scalp soft tissue abnormality. Sinuses/Orbits: Postoperative changes as noted at the left globe including ocular lens replacement. Triangular FLAIR density at the left right none noted, which could be postoperative as well (series 11, image 9). Mild scattered mucosal thickening noted about the ethmoidal air cells, left sphenoid sinus, and right  maxillary sinus. Paranasal sinuses are otherwise clear. No mastoid effusion. Inner ear structures grossly normal. Other: None. MRA HEAD FINDINGS ANTERIOR CIRCULATION: Examination mildly degraded by motion artifact. Visualized distal cervical segments of the internal carotid arteries are patent with antegrade flow. Petrous segments patent bilaterally. Scattered atheromatous irregularity within the carotid siphons without hemodynamically significant stenosis. A1 segments patent bilaterally. Normal anterior communicating artery complex. Anterior cerebral arteries patent to their distal aspects without high-grade stenosis. Short-segment mild-to-moderate proximal left M1 stenosis (series 1039, image 13). Left M1 bifurcates early. Distal left MCA branches well perfused although demonstrate diffuse small vessel atheromatous irregularity. Right M1 widely patent. Focal severe stenosis at the right MCA bifurcation to involve the adjacent proximal M2 segments (series 1039, image 11). Right MCA branches perfused distally although demonstrates small vessel atheromatous irregularity. POSTERIOR CIRCULATION: Both vertebral arteries patent to the vertebrobasilar junction without stenosis. Left vertebral artery slightly dominant. Both picas patent at their origins. Basilar widely patent proximally, with mild narrowing at the basilar tip. Superior cerebral arteries grossly patent bilaterally. Right PCA supplied via the basilar. There is a focal severe proximal right P2 stenosis, measuring approximately 4 mm in length (series 1051, image 11). Right PCA irregular but perfused distally. Fetal type origin of the left PCA. Left PCA also irregular but remains patent to its distal aspect. Distal moderate to severe bilateral P3 stenoses noted (series 1057, image 18). No aneurysm. IMPRESSION: MRI HEAD IMPRESSION: 1. 7 mm acute ischemic nonhemorrhagic infarct involving the ventral right thalamus. 2. Additional 11 mm acute to early subacute  ischemic nonhemorrhagic right periatrial white matter infarcts, with extension to involve the adjacent dorsal right thalamus. 3. Late subacute to chronic ischemic infarcts involving the right parieto-occipital region, right corona radiata, and anterior left corpus callosum/corona radiata. 4. Underlying moderate chronic small vessel ischemic disease with multiple remote lacunar infarcts as above. MRA HEAD IMPRESSION: 1. Negative intracranial MRA for large vessel occlusion. 2. Focal severe proximal right  P2 stenosis, likely accounting for the extensive right PCA distribution ischemic changes. 3. Additional severe stenosis at the right MCA bifurcation to involve the adjacent proximal M2 segments. 4. Moderate distal small vessel atheromatous irregularity throughout the intracranial circulation. Electronically Signed   By: Jeannine Boga M.D.   On: 07/28/2020 06:12   MR BRAIN WO CONTRAST  Result Date: 07/28/2020 CLINICAL DATA:  Follow-up examination for acute stroke. EXAM: MRI HEAD WITHOUT CONTRAST MRA HEAD WITHOUT CONTRAST TECHNIQUE: Multiplanar, multiecho pulse sequences of the brain and surrounding structures were obtained without intravenous contrast. Angiographic images of the head were obtained using MRA technique without contrast. COMPARISON:  Prior CT from earlier the same day. FINDINGS: MRI HEAD FINDINGS Brain: Generalized age-related cerebral atrophy. Patchy and confluent T2/FLAIR hyperintensity within the periventricular deep white matter both cerebral hemispheres most consistent with chronic small vessel ischemic disease, moderate in nature. Remote lacunar infarcts noted at the right basal ganglia/corona radiata and left thalamus. Patchy involvement of the pons noted. Probable few additional remote lacunar infarcts noted about the cerebellum near the roof of the fourth ventricle (series 16, image 14). Encephalomalacia gliosis extending from the right periatrial white matter towards the overlying  right parieto-occipital region consistent with right PCA territory ischemic change. Mild residual diffusion abnormality without ADC correlate, felt to be most consistent with a late subacute to chronic ischemic infarct. Similar late subacute to chronic ischemic change noted involving the right corona radiata (series 5, image 78, left genu of the anterior corpus callosum (series 5, image 79, and left frontal corona radiata (series 5, image 77). Associated scattered areas of susceptibility artifact could reflect hemosiderin staining and/or laminar necrosis. There is additional 7 mm focus of restricted diffusion involving the ventral right thalamus (series 5, image 73). Associated signal loss on corresponding ADC map, consistent with an acute ischemic infarct. Patchy extension towards the periventricular aspect of the third ventricle (series 5, image 71). Additional 11 mm focus of more mild diffusion signal abnormality involving the right periatrial white matter also demonstrates signal loss on corresponding ADC map (series 6, image 25), consistent with acute to subacute ischemia. Extension into the adjacent dorsal thalamus (series 5, image 71). No associated hemorrhage about these more acute infarcts. No other evidence for acute or subacute ischemia. Gray-white matter differentiation otherwise maintained. No evidence for acute intracranial hemorrhage. No mass lesion, midline shift or mass effect. Ventricles normal size without hydrocephalus. No extra-axial fluid collection. Pituitary gland suprasellar region within normal limits. Midline structures intact. Vascular: Major intracranial vascular flow voids are maintained. Asymmetric FLAIR signal intensity within the left transverse and sigmoid sinuses without associated T1 correlate favored to be related to slow/sluggish flow rather than thrombus. Skull and upper cervical spine: Craniocervical junction within normal limits. Bone marrow signal intensity normal. No scalp  soft tissue abnormality. Sinuses/Orbits: Postoperative changes as noted at the left globe including ocular lens replacement. Triangular FLAIR density at the left right none noted, which could be postoperative as well (series 11, image 9). Mild scattered mucosal thickening noted about the ethmoidal air cells, left sphenoid sinus, and right maxillary sinus. Paranasal sinuses are otherwise clear. No mastoid effusion. Inner ear structures grossly normal. Other: None. MRA HEAD FINDINGS ANTERIOR CIRCULATION: Examination mildly degraded by motion artifact. Visualized distal cervical segments of the internal carotid arteries are patent with antegrade flow. Petrous segments patent bilaterally. Scattered atheromatous irregularity within the carotid siphons without hemodynamically significant stenosis. A1 segments patent bilaterally. Normal anterior communicating artery complex. Anterior cerebral arteries patent to their  distal aspects without high-grade stenosis. Short-segment mild-to-moderate proximal left M1 stenosis (series 1039, image 13). Left M1 bifurcates early. Distal left MCA branches well perfused although demonstrate diffuse small vessel atheromatous irregularity. Right M1 widely patent. Focal severe stenosis at the right MCA bifurcation to involve the adjacent proximal M2 segments (series 1039, image 11). Right MCA branches perfused distally although demonstrates small vessel atheromatous irregularity. POSTERIOR CIRCULATION: Both vertebral arteries patent to the vertebrobasilar junction without stenosis. Left vertebral artery slightly dominant. Both picas patent at their origins. Basilar widely patent proximally, with mild narrowing at the basilar tip. Superior cerebral arteries grossly patent bilaterally. Right PCA supplied via the basilar. There is a focal severe proximal right P2 stenosis, measuring approximately 4 mm in length (series 1051, image 11). Right PCA irregular but perfused distally. Fetal type  origin of the left PCA. Left PCA also irregular but remains patent to its distal aspect. Distal moderate to severe bilateral P3 stenoses noted (series 1057, image 18). No aneurysm. IMPRESSION: MRI HEAD IMPRESSION: 1. 7 mm acute ischemic nonhemorrhagic infarct involving the ventral right thalamus. 2. Additional 11 mm acute to early subacute ischemic nonhemorrhagic right periatrial white matter infarcts, with extension to involve the adjacent dorsal right thalamus. 3. Late subacute to chronic ischemic infarcts involving the right parieto-occipital region, right corona radiata, and anterior left corpus callosum/corona radiata. 4. Underlying moderate chronic small vessel ischemic disease with multiple remote lacunar infarcts as above. MRA HEAD IMPRESSION: 1. Negative intracranial MRA for large vessel occlusion. 2. Focal severe proximal right P2 stenosis, likely accounting for the extensive right PCA distribution ischemic changes. 3. Additional severe stenosis at the right MCA bifurcation to involve the adjacent proximal M2 segments. 4. Moderate distal small vessel atheromatous irregularity throughout the intracranial circulation. Electronically Signed   By: Jeannine Boga M.D.   On: 07/28/2020 06:12   DG Chest Portable 1 View  Result Date: 07/28/2020 CLINICAL DATA:  Altered mental status, increasing lethargy EXAM: PORTABLE CHEST 1 VIEW COMPARISON:  Radiograph 07/13/2018 FINDINGS: Low lung volumes and streaky atelectatic changes. Mild vascular congestion without features of frank edema. No consolidative opacity. No pneumothorax or visible effusion. Postsurgical changes related to prior CABG including intact and aligned sternotomy wires and multiple surgical clips projecting over the mediastinum. The aorta is calcified. The remaining cardiomediastinal contours are unremarkable. No acute osseous or soft tissue abnormality. Telemetry leads overlie the chest. IMPRESSION: 1. Low lung volumes, atelectasis 2. Mild  vascular congestion without features of frank edema. 3. Prior sternotomy and CABG. Electronically Signed   By: Lovena Le M.D.   On: 07/28/2020 03:52   EEG adult  Result Date: 07/28/2020 Lora Havens, MD     07/28/2020  4:34 PM Patient Name: [name] MRN: [id number] Epilepsy Attending: Lora Havens Referring Physician/Provider: Mikey Bussing, PA Date: 07/28/2020 Duration: 26.18 mins  atient history: 54yo F with chronic right occipital infarct, acre right thalamic infarct with worsening ams. EEG to evaluate for seizure Level of alertness: Awake, asleep AEDs during EEG study: None Technical aspects: This EEG study was done with scalp electrodes positioned according to the 10-20 International system of electrode placement. Electrical activity was acquired at a sampling rate of 500Hz  and reviewed with a high frequency filter of 70Hz  and a low frequency filter of 1Hz . EEG data were recorded continuously and digitally stored. Description: No clear posterior dominant rhythm was seen. Sleep was characterized by vertex waves, sleep spindles (12 to 14 Hz), maximal frontocentral region. EEG showed continuous generalized  to 6 Hz theta as well as intermittent rhythmic rhythmic delta slowing. Hyperventilation and photic stimulation were not performed.   ABNORMALITY -Continued slow, generalized -Intermittent rhythmic delta slow, generalized IMPRESSION: This study is suggestive of moderate diffuse encephalopathy, nonspecific etiology. No seizures or epileptiform discharges were seen throughout the recording. Lora Havens   ECHOCARDIOGRAM COMPLETE BUBBLE STUDY  Result Date: 07/29/2020    ECHOCARDIOGRAM REPORT   Patient Name:   [name] Date of Exam: 07/29/2020 Medical Rec #:  [id number]         Height:       62.0 in Accession #:    [id number]        Weight:       132.3 lb Date of Birth:  30-Jul-1966          BSA:          1.604 m Patient Age:    73 years          BP:           128/69 mmHg  Patient Gender: F                 HR:           78 bpm. Exam Location:  Inpatient Procedure: 2D Echo and Saline Contrast Bubble Study Indications:    stroke 434.91  History:        Patient has prior history of Echocardiogram examinations, most                 recent 06/28/2018.  Sonographer:    Johny Chess Referring Phys: [id number] [place]  1. Left ventricular ejection fraction, by estimation, is 55 to 60%. The left ventricle has normal function. The left ventricle has no regional wall motion abnormalities. Left ventricular diastolic parameters are consistent with Grade II diastolic dysfunction (pseudonormalization). Elevated left ventricular end-diastolic pressure.  2. Right ventricular systolic function is normal. The right ventricular size is normal.  3. The mitral valve is normal in structure. Trivial mitral valve regurgitation. No evidence of mitral stenosis.  4. The aortic valve is tricuspid. Aortic valve regurgitation is not visualized. Mild aortic valve sclerosis is present, with no evidence of aortic valve stenosis.  5. The inferior vena cava is normal in size with greater than 50% respiratory variability, suggesting right atrial pressure of 3 mmHg.  6. Agitated saline contrast bubble study was negative, with no evidence of any interatrial shunt. FINDINGS  Left Ventricle: Left ventricular ejection fraction, by estimation, is 55 to 60%. The left ventricle has normal function. The left ventricle has no regional wall motion abnormalities. The left ventricular internal cavity size was normal in size. There is  no left ventricular hypertrophy. Left ventricular diastolic parameters are consistent with Grade II diastolic dysfunction (pseudonormalization). Elevated left ventricular end-diastolic pressure. Right Ventricle: The right ventricular size is normal. No increase in right ventricular wall thickness. Right ventricular systolic function is normal. Left Atrium: Left atrial size was normal  in size. Right Atrium: Right atrial size was normal in size. Pericardium: There is no evidence of pericardial effusion. Mitral Valve: The mitral valve is normal in structure. There is mild thickening of the mitral valve leaflet(s). There is mild calcification of the mitral valve leaflet(s). Mild mitral annular calcification. Trivial mitral valve regurgitation. No evidence  of mitral valve stenosis. Tricuspid Valve: The tricuspid valve is normal in structure. Tricuspid valve regurgitation is trivial. No evidence of tricuspid stenosis. Aortic Valve: The aortic valve is  tricuspid. Aortic valve regurgitation is not visualized. Mild aortic valve sclerosis is present, with no evidence of aortic valve stenosis. Pulmonic Valve: The pulmonic valve was normal in structure. Pulmonic valve regurgitation is not visualized. No evidence of pulmonic stenosis. Aorta: The aortic root is normal in size and structure. Venous: The inferior vena cava is normal in size with greater than 50% respiratory variability, suggesting right atrial pressure of 3 mmHg. IAS/Shunts: No atrial level shunt detected by color flow Doppler. Agitated saline contrast was given intravenously to evaluate for intracardiac shunting. Agitated saline contrast bubble study was negative, with no evidence of any interatrial shunt.  LEFT VENTRICLE PLAX 2D LVIDd:         4.00 cm  Diastology LVIDs:         2.70 cm  LV e' medial:    3.92 cm/s LV PW:         1.10 cm  LV E/e' medial:  22.1 LV IVS:        1.00 cm  LV e' lateral:   7.29 cm/s LVOT diam:     1.70 cm  LV E/e' lateral: 11.9 LV SV:         33 LV SV Index:   21 LVOT Area:     2.27 cm  RIGHT VENTRICLE            IVC RV S prime:     9.57 cm/s  IVC diam: 1.50 cm TAPSE (M-mode): 2.4 cm LEFT ATRIUM             Index       RIGHT ATRIUM          Index LA diam:        2.90 cm 1.81 cm/m  RA Area:     8.52 cm LA Vol (A2C):   28.3 ml 17.65 ml/m RA Volume:   14.80 ml 9.23 ml/m LA Vol (A4C):   29.0 ml 18.08 ml/m LA  Biplane Vol: 30.0 ml 18.71 ml/m  AORTIC VALVE LVOT Vmax:   75.10 cm/s LVOT Vmean:  47.100 cm/s LVOT VTI:    0.146 m  AORTA Ao Root diam: 2.70 cm Ao Asc diam:  3.00 cm MITRAL VALVE MV Area (PHT): 3.53 cm    SHUNTS MV Decel Time: 215 msec    Systemic VTI:  0.15 m MV E velocity: 86.50 cm/s  Systemic Diam: 1.70 cm MV A velocity: 99.40 cm/s MV E/A ratio:  0.87 Jenkins Rouge MD Electronically signed by Jenkins Rouge MD Signature Date/Time: 07/29/2020/9:21:25 AM    Final    VAS US CAROTID  Result Date: 07/29/2020 Carotid Arterial Duplex Study Indications:       CVA. Risk Factors:      Hypertension, hyperlipidemia, Diabetes, coronary artery                    disease, prior CVA. Comparison Study:  04/01/16 previous Performing Technologist: Abram Sander RVS  Examination Guidelines: A complete evaluation includes B-mode imaging, spectral Doppler, color Doppler, and power Doppler as needed of all accessible portions of each vessel. Bilateral testing is considered an integral part of a complete examination. Limited examinations for reoccurring indications may be performed as noted.  Right Carotid Findings: +----------+--------+--------+--------+------------------+--------+           PSV cm/sEDV cm/sStenosisPlaque DescriptionComments +----------+--------+--------+--------+------------------+--------+ CCA Prox  61      13              heterogenous               +----------+--------+--------+--------+------------------+--------+  CCA Distal60      15              heterogenous               +----------+--------+--------+--------+------------------+--------+ ICA Prox  102     26      1-39%   heterogenous               +----------+--------+--------+--------+------------------+--------+ ICA Distal64      14                                         +----------+--------+--------+--------+------------------+--------+ ECA       135     15                                          +----------+--------+--------+--------+------------------+--------+ +----------+--------+-------+--------+-------------------+           PSV cm/sEDV cmsDescribeArm Pressure (mmHG) +----------+--------+-------+--------+-------------------+ [id number]                                         +----------+--------+-------+--------+-------------------+ +---------+--------+--+--------+-+---------+ VertebralPSV cm/s24EDV cm/s5Antegrade +---------+--------+--+--------+-+---------+  Left Carotid Findings: +----------+--------+--------+--------+------------------+--------+           PSV cm/sEDV cm/sStenosisPlaque DescriptionComments +----------+--------+--------+--------+------------------+--------+ CCA Prox  94      18              heterogenous               +----------+--------+--------+--------+------------------+--------+ CCA Distal80      15              heterogenous               +----------+--------+--------+--------+------------------+--------+ ICA Prox  82      24      1-39%   heterogenous               +----------+--------+--------+--------+------------------+--------+ ICA Distal60      17                                         +----------+--------+--------+--------+------------------+--------+ ECA       116     14                                         +----------+--------+--------+--------+------------------+--------+ +----------+--------+--------+--------+-------------------+           PSV cm/sEDV cm/sDescribeArm Pressure (mmHG) +----------+--------+--------+--------+-------------------+ [id number]                                         +----------+--------+--------+--------+-------------------+ +---------+--------+--+--------+-+---------+ VertebralPSV cm/s29EDV cm/s7Antegrade +---------+--------+--+--------+-+---------+   Summary: Right Carotid: Velocities in the right ICA are consistent with a 1-39% stenosis. Left Carotid:  Velocities in the left ICA are consistent with a 1-39% stenosis. Vertebrals: Bilateral vertebral arteries demonstrate antegrade flow. *See table(s) above for measurements and observations.     Preliminary    Scheduled Meds: . aspirin  300 mg Rectal Daily  . clopidogrel  75 mg Oral Q breakfast  . enoxaparin (LOVENOX) injection  40 mg Subcutaneous Daily  . insulin aspart  0-9 Units Subcutaneous Q4H  . [START ON 07/31/2020] levothyroxine  12.5 mcg Intravenous Daily  . metoprolol tartrate  2.5 mg Intravenous Q6H  . pantoprazole (PROTONIX) IV  40 mg Intravenous Q24H   Continuous Infusions: . sodium chloride 50 mL/hr at 07/29/20 0935  . acyclovir 600 mg (07/29/20 1002)  . ampicillin (OMNIPEN) IV 2 g (07/29/20 1007)  . cefTRIAXone (ROCEPHIN)  IV 2 g (07/29/20 0946)  . levETIRAcetam 750 mg (07/29/20 0941)  . levETIRAcetam    . vancomycin       LOS: 1 day    Time spent: 35 mins    PARDEEP KUMAR, MD [facility]   If 7PM-7AM, please contact night-coverage

## 2020-07-29 NOTE — Progress Notes (Signed)
Occupational Therapy Evaluation Patient Details Name: [name] MRN: [id number] DOB: 05/03/1966 Today's Date: 07/29/2020    History of Present Illness Pt is a 54 y/o female admitted secondary to AMS. Imaging revealed R thalamic infarct and R white matter infarcts. PMH includes bilateral BKA, DM, dCHF, CKD, CAD s/p CABG.    Clinical Impression   On arrival to room pt attempting to remove B mittens, pleasant but continues to present with confusion. Oriented to only self, poor recall and inability to sustain arousal throughout. Endorsing using B prosthetics for mobility, and being mod indep with ADL's living with son. Family not present to confirm at this time. Currently pt requiring mod A for bed mobility and transition to sitting, and at consistently max A for ADL's. Anticipate pt to require post acute OT listed below, with plan for OT to continue to follow acutely.      Follow Up Recommendations  Supervision/Assistance - 24 hour;SNF    Equipment Recommendations  Other (comment) (TBD)    Recommendations for Other Services       Precautions / Restrictions Precautions Precautions: Fall Precaution Comments: bilateral BKA  Restrictions Weight Bearing Restrictions: No      Mobility Bed Mobility Overal bed mobility: Needs Assistance Bed Mobility: Supine to Sit;Sit to Supine;Rolling Rolling: Mod assist   Supine to sit: Mod assist Sit to supine: Mod assist   General bed mobility comments: Mod A for transition to sitting upright in long sitting this date, decreasd awareness and sequencing    Transfers                 General transfer comment: deferreed OOB transfers this date for safety    Balance Overall balance assessment: Needs assistance Sitting-balance support: Bilateral upper extremity supported Sitting balance-Leahy Scale: Poor                                   ADL either performed or assessed with clinical judgement   ADL Overall ADL's  : Needs assistance/impaired     Grooming: Moderate assistance;Cueing for safety;Cueing for sequencing           Upper Body Dressing : Moderate assistance;Bed level   Lower Body Dressing: Maximal assistance                 General ADL Comments: deferred OOB activity this date due to concern for safety      Vision Baseline Vision/History: No visual deficits Vision Assessment?: No apparent visual deficits     Perception     Praxis      Pertinent Vitals/Pain Pain Assessment: No/denies pain     Hand Dominance     Extremity/Trunk Assessment Upper Extremity Assessment Upper Extremity Assessment: Overall WFL for tasks assessed   Cervical / Trunk Assessment Cervical / Trunk Assessment: Kyphotic   Communication Communication Communication: No difficulties   Cognition Arousal/Alertness: Lethargic Behavior During Therapy: Flat affect Overall Cognitive Status: No family/caregiver present to determine baseline cognitive functioning                                 General Comments: disoriented to all aspects of location/date etc., poor attention and decreased periods of alertness    General Comments  VSS on RA    Exercises Exercises: General Lower Extremity General Exercises - Lower Extremity Hip ABduction/ADduction: AAROM;Both;10 reps;Supine Straight Leg Raises: AROM;Both;10 reps;Supine  Shoulder Instructions      Home Living Family/patient expects to be discharged to:: Private residence Living Arrangements: Children Available Help at Discharge: Family;Available PRN/intermittently Type of Home: House Home Access: Ramped entrance     Home Layout: One level     Bathroom Shower/Tub: Tub/shower unit;Walk-in shower   Bathroom Toilet: Standard     Home Equipment: Cane - single point;Walker - 2 wheels;Wheelchair - Biomedical scientist Comments: bath is not w/c accessible      Prior Functioning/Environment Level of  Independence: Needs assistance  Gait / Transfers Assistance Needed: Pt initially reporting independence with transfers, however, then reports she needs assist.  ADL's / Homemaking Assistance Needed: Reports she typically sponge bathes. Sometimes gets in shower, but needs assist.    Comments: difficulty with reporting baseline, increased agittaion observed with pulling at mittens and lines in room, endorsing she was indep at baseline. will need to confirm        OT Problem List: Decreased strength;Decreased activity tolerance;Impaired balance (sitting and/or standing);Decreased coordination;Decreased cognition;Decreased safety awareness;Decreased knowledge of use of DME or AE;Decreased knowledge of precautions      OT Treatment/Interventions: Self-care/ADL training;Therapeutic exercise;Balance training;Therapeutic activities;DME and/or AE instruction;Energy conservation;Cognitive remediation/compensation;Visual/perceptual remediation/compensation;Patient/family education    OT Goals(Current goals can be found in the care plan section) Acute Rehab OT Goals Patient Stated Goal: none stated OT Goal Formulation: Patient unable to participate in goal setting Time For Goal Achievement: 08/12/20 Potential to Achieve Goals: Fair ADL Goals Pt Will Perform Grooming: with set-up Pt Will Perform Upper Body Dressing: with modified independence Pt Will Perform Lower Body Dressing: with modified independence Pt Will Transfer to Toilet: regular height toilet;grab bars;bedside commode;with min guard assist Pt Will Perform Toileting - Clothing Manipulation and hygiene: with supervision;bed level;sitting/lateral leans  OT Frequency: Min 2X/week   Barriers to D/C:            Co-evaluation              AM-PAC OT "6 Clicks" Daily Activity     Outcome Measure Help from another person eating meals?: A Lot Help from another person taking care of personal grooming?: A Little Help from another person  toileting, which includes using toliet, bedpan, or urinal?: Total Help from another person bathing (including washing, rinsing, drying)?: Total Help from another person to put on and taking off regular upper body clothing?: A Lot Help from another person to put on and taking off regular lower body clothing?: Total 6 Click Score: 10   End of Session    Activity Tolerance: Patient limited by lethargy Patient left: in bed;with bed alarm set  OT Visit Diagnosis: Muscle weakness (generalized) (M62.81);Other symptoms and signs involving the nervous system (R29.898);Other symptoms and signs involving cognitive function                Time: 1331-1347 OT Time Calculation (min): 16 min Charges:  OT General Charges $OT Visit: 1 Visit OT Evaluation $OT Eval Low Complexity: 1 Low  Sartaj Hoskin OTR/L acute rehab services Office: [phone]   Joya Gaskins 07/29/2020, 2:31 PM

## 2020-07-29 NOTE — Evaluation (Signed)
Physical Therapy Evaluation Patient Details Name: [name] MRN: [id number] DOB: 1966-03-05 Today's Date: 07/29/2020   History of Present Illness  Pt is a 54 y/o female admitted secondary to AMS. Imaging revealed R thalamic infarct and R white matter infarcts. PMH includes bilateral BKA, DM, dCHF, CKD, CAD s/p CABG.   Clinical Impression  Pt admitted secondary to problem above with deficits below. Pt requiring mod A to perform basic bed mobility tasks. Noted lethargy throughout and pt quickly falling asleep upon completion of mobility. Was able to perform supine HEP as well. Pt reporting conflicting information about how much assist she typically needs, so unsure of baseline. Reports she lives with her son. Feel she will benefit from SNF level therapies at d/c. Will continue to follow acutely.     Follow Up Recommendations SNF;Supervision/Assistance - 24 hour    Equipment Recommendations  Other (comment) (TBD)    Recommendations for Other Services       Precautions / Restrictions Precautions Precautions: Fall Precaution Comments: bilateral BKA  Restrictions Weight Bearing Restrictions: No      Mobility  Bed Mobility Overal bed mobility: Needs Assistance Bed Mobility: Supine to Sit;Sit to Supine;Rolling Rolling: Mod assist   Supine to sit: Mod assist Sit to supine: Mod assist   General bed mobility comments: Mod A to come to long sitting using bed rails. Pt continued to be somewhat lethargic so did not attempt further mobility. Mod A for assist with rolling as well. Required cues of bed rails and max multimodal cues.     Transfers                    Ambulation/Gait                Stairs            Wheelchair Mobility    Modified Rankin (Stroke Patients Only)       Balance Overall balance assessment: Needs assistance Sitting-balance support: Bilateral upper extremity supported Sitting balance-Leahy Scale: Poor Sitting balance -  Comments: Reliant on UE and external support                                      Pertinent Vitals/Pain Pain Assessment: No/denies pain    Home Living Family/patient expects to be discharged to:: Private residence Living Arrangements: Children Available Help at Discharge: Family;Available PRN/intermittently Type of Home: House Home Access: Ramped entrance     Home Layout: One level Home Equipment: Cane - single point;Walker - 2 wheels;Wheelchair - manual;Shower seat      Prior Function Level of Independence: Needs assistance   Gait / Transfers Assistance Needed: Pt initially reporting independence with transfers, however, then reports she needs assist.   ADL's / Homemaking Assistance Needed: Reports she typically sponge bathes. Sometimes gets in shower, but needs assist.         Hand Dominance        Extremity/Trunk Assessment   Upper Extremity Assessment Upper Extremity Assessment: Defer to OT evaluation    Lower Extremity Assessment Lower Extremity Assessment: RLE deficits/detail;LLE deficits/detail RLE Deficits / Details: BKA at baseline. Lacking ~10-15 degrees of knee extension.  LLE Deficits / Details: L BKA at baseline. Noted pt lacking ~10-15 deg of extension. Also noted decreased range into hip abduction.     Cervical / Trunk Assessment Cervical / Trunk Assessment: Kyphotic  Communication   Communication: No difficulties  Cognition Arousal/Alertness: Lethargic Behavior During Therapy: Flat affect Overall Cognitive Status: No family/caregiver present to determine baseline cognitive functioning                                 General Comments: Pt disoriented to time and situation. Reporting conflicting information throughout. Lethargic, however, would have periods of increased alertness with mobility. Quickly fell asleep soon after finishing mobility.       General Comments      Exercises General Exercises - Lower  Extremity Hip ABduction/ADduction: AAROM;Both;10 reps;Supine Straight Leg Raises: AROM;Both;10 reps;Supine   Assessment/Plan    PT Assessment Patient needs continued PT services  PT Problem List Decreased strength;Decreased balance;Decreased mobility;Decreased cognition;Decreased knowledge of use of DME;Decreased safety awareness;Decreased knowledge of precautions;Decreased activity tolerance       PT Treatment Interventions DME instruction;Functional mobility training;Therapeutic activities;Therapeutic exercise;Balance training;Patient/family education;Cognitive remediation;Wheelchair mobility training    PT Goals (Current goals can be found in the Care Plan section)  Acute Rehab PT Goals Patient Stated Goal: none stated PT Goal Formulation: With patient Time For Goal Achievement: 08/12/20 Potential to Achieve Goals: Fair    Frequency Min 3X/week   Barriers to discharge        Co-evaluation               AM-PAC PT "6 Clicks" Mobility  Outcome Measure Help needed turning from your back to your side while in a flat bed without using bedrails?: A Lot Help needed moving from lying on your back to sitting on the side of a flat bed without using bedrails?: A Lot Help needed moving to and from a bed to a chair (including a wheelchair)?: A Lot Help needed standing up from a chair using your arms (e.g., wheelchair or bedside chair)?: Total Help needed to walk in hospital room?: Total Help needed climbing 3-5 steps with a railing? : Total 6 Click Score: 9    End of Session   Activity Tolerance: Patient limited by lethargy Patient left: in bed;with call bell/phone within reach;with bed alarm set Nurse Communication: Mobility status PT Visit Diagnosis: Difficulty in walking, not elsewhere classified (R26.2);Other abnormalities of gait and mobility (R26.89)    Time: 9774-1423 PT Time Calculation (min) (ACUTE ONLY): 12 min   Charges:   PT Evaluation $PT Eval Moderate  Complexity: 1 Mod          Reuel Derby, PT, DPT  Acute Rehabilitation Services  Pager: [phone] Office: [phone]   Rudean Hitt 07/29/2020, 12:35 PM

## 2020-07-29 NOTE — Progress Notes (Signed)
Carotid duplex has been completed.   Preliminary results in CV Proc.   Abram Sander 07/29/2020 8:20 AM

## 2020-07-29 NOTE — Evaluation (Signed)
Clinical/Bedside Swallow Evaluation Patient Details  Name: [name] MRN: [id number] Date of Birth: 1965-12-13  Today's Date: 07/29/2020 Time: SLP Start Time (ACUTE ONLY): 0950 SLP Stop Time (ACUTE ONLY): 1010 SLP Time Calculation (min) (ACUTE ONLY): 20 min  Past Medical History:  Past Medical History:  Diagnosis Date  . Abscess of great toe, right   . Acute on chronic diastolic CHF (congestive heart failure) ([place]) 06/24/2018  . Acute osteomyelitis of toe, left ([place]) 09/05/2016  . Amputation of right great toe ([place]) 12/22/2018  . Anxiety   . Cataract    left - surgery to remove  . Cellulitis of foot, right 12/11/2018  . COMMON MIGRAINE 10/07/2010  . Coronary artery disease   . Decreased visual acuity 11/10/2016  . Depression 12/18/2012  . Diabetes mellitus type II, uncontrolled ([place]) 10/07/2010   Qualifier: Diagnosis of  By: Charlett Blake MD, [name]    . Diabetic foot infection ([place]) 08/26/2016  . Diabetic infection of right foot ([place])   . Disturbance of skin sensation 10/07/2010  . Gangrene of right foot ([place])   . GERD (gastroesophageal reflux disease)   . Heart murmur   . History of kidney stones    "years ago" - passed stones  . Hyperlipidemia 12/06/2010  . Hypertension   . Hypothyroidism   . Lipoma of abdominal wall 10/05/2016  . Overweight(278.02) 12/06/2010  . PERIPHERAL NEUROPATHY, FEET 10/07/2010   11/17/2019  . PVD (peripheral vascular disease) ([place]) 01/21/2012  . RESTLESS LEG SYNDROME 10/25/2010  . Stroke [facility]) 2014, 2017   most recently in 2/17 - intracerebral hemorrhage   Past Surgical History:  Past Surgical History:  Procedure Laterality Date  . AMPUTATION Right 12/13/2018   Procedure: AMPUTATION RIGHT GREAT TOE, LOCAL RELOCATION OF TISSUE FOR WOUND CLOSURE 9cm x 3cm, VAC APPLICATION;  Surgeon: [name], MD;  Location: [place];  Service: Orthopedics;  Laterality: Right;  . AMPUTATION Right 12/31/2018   Procedure: RIGHT BELOW KNEE AMPUTATION;  Surgeon: [name],  MD;  Location: [place];  Service: Orthopedics;  Laterality: Right;  . AMPUTATION Left 09/21/2019   Procedure: LEFT BELOW KNEE AMPUTATION;  Surgeon: [name], MD;  Location: [place];  Service: Orthopedics;  Laterality: Left;  . AMPUTATION TOE Left 09/09/2016   Procedure: AMPUTATION OF LEFT GREAT TOE;  Surgeon: Milly Jakob, MD;  Location: [place];  Service: Orthopedics;  Laterality: Left;  . BELOW KNEE LEG AMPUTATION Left 09/21/2019  . CARDIAC CATHETERIZATION  06/24/2018  . CORONARY ARTERY BYPASS GRAFT N/A 06/28/2018   Procedure: CORONARY ARTERY BYPASS GRAFTING (CABG) times  four, using left internal mammary artery, endoscopically harvested right saphenous vein, and harvested left radial artery;  Surgeon: [name], MD;  Location: [place];  Service: Open Heart Surgery;  Laterality: N/A;  . ENDOVEIN HARVEST OF GREATER SAPHENOUS VEIN Right 06/28/2018   Procedure: ENDOVEIN HARVEST OF GREATER SAPHENOUS VEIN;  Surgeon: [name], MD;  Location: [place];  Service: Open Heart Surgery;  Laterality: Right;  . EYE SURGERY Left 06/2017   [facility]  . LEFT HEART CATH AND CORONARY ANGIOGRAPHY N/A 06/24/2018   Procedure: LEFT HEART CATH AND CORONARY ANGIOGRAPHY;  Surgeon: Troy Sine, MD;  Location: [place] [facility];  Service: Cardiovascular;  Laterality: N/A;  . RADIAL ARTERY HARVEST Left 06/28/2018   Procedure: RADIAL ARTERY HARVEST;  Surgeon: [name], MD;  Location: [place];  Service: Open Heart Surgery;  Laterality: Left;  . STUMP REVISION Left 11/18/2019  Procedure: REVISION LEFT BELOW KNEE AMPUTATION;  Surgeon: [name], MD;  Location: [place];  Service: Orthopedics;  Laterality: Left;  . TEE WITHOUT CARDIOVERSION N/A 06/28/2018   Procedure: TRANSESOPHAGEAL ECHOCARDIOGRAM (TEE);  Surgeon: [name], MD;  Location: [place];  Service: Open Heart Surgery;  Laterality: N/A;  . WISDOM TOOTH EXTRACTION     HPI:  54 year old past history of  diabetes, peripheral vascular disease status post bilateral BKA's, prior ICH with some left-sided residual stiffness and no gross weakness but some residual dysarthria, hypertension, hyperlipidemia presented for evaluation of altered mental status. CT scan concerning for new strokes compared to prior scan of 2017, which was followed up with the MRI which shows lacunar infarct in the thalamus and some subacute posterior circulation strokes   Assessment / Plan / Recommendation Clinical Impression  Patient presents with a moderate oropharyngeal dysphagia with significant impact from current state of lethargy. Per RN, only sedating meds patient is currently is [place]. During this assessment, patient was able to follow command to open mouth, but only inconsistently followed command to open eyes. She did verbalize when asked, that she was "in hospital". She demonstrated oral delays with very small cup sips of thin liquids (water) and small bites puree solids (applesauce) but did not exhibit any overt coughing or throat clearing and voice remained clear. Patient is not yet ready to be able to safely consume PO's but likely will tolerate oral diet once she is consistently and adequately alert. SLP Visit Diagnosis: Dysphagia, unspecified (R13.10)    Aspiration Risk  Moderate aspiration risk    Diet Recommendation NPO   Medication Administration: Via alternative means    Other  Recommendations Oral Care Recommendations: Oral care BID;Staff/trained caregiver to provide oral care   Follow up Recommendations Other (comment) (TBD)      Frequency and Duration min 2x/week  2 weeks       Prognosis Prognosis for Safe Diet Advancement: Good      Swallow Study   General Date of Onset: 07/28/20 HPI: 54 year old past history of diabetes, peripheral vascular disease status post bilateral BKA's, prior ICH with some left-sided residual stiffness and no gross weakness but some residual dysarthria, hypertension,  hyperlipidemia presented for evaluation of altered mental status. CT scan concerning for new strokes compared to prior scan of 2017, which was followed up with the MRI which shows lacunar infarct in the thalamus and some subacute posterior circulation strokes Type of Study: Bedside Swallow Evaluation Previous Swallow Assessment: none Diet Prior to this Study: NPO Temperature Spikes Noted: No Respiratory Status: Room air History of Recent Intubation: No Behavior/Cognition: Confused;Lethargic/Drowsy;Distractible;Requires cueing Oral Cavity Assessment: Within Functional Limits Oral Care Completed by SLP: Yes Oral Cavity - Dentition: Edentulous (did not allow for full assessment but appeared to have bottom dentures vs natural teeth) Vision: Impaired for self-feeding Self-Feeding Abilities: Total assist Patient Positioning: Upright in bed Baseline Vocal Quality: Low vocal intensity Volitional Cough: Cognitively unable to elicit Volitional Swallow: Unable to elicit    Oral/Motor/Sensory Function Overall Oral Motor/Sensory Function: Within functional limits   Ice Chips     Thin Liquid Thin Liquid: Impaired Oral Phase Impairments: Poor awareness of bolus Oral Phase Functional Implications: Prolonged oral transit    Nectar Thick     Honey Thick     Puree Puree: Impaired Oral Phase Impairments: Poor awareness of bolus Oral Phase Functional Implications: Prolonged oral transit   Solid     Solid: Not tested      Jenny Reichmann  Earline Mayotte, MA, [place] [facility]

## 2020-07-29 NOTE — Progress Notes (Signed)
  Echocardiogram 2D Echocardiogram has been performed.  [name] 07/29/2020, 9:08 AM

## 2020-07-30 DIAGNOSIS — G9340 Encephalopathy, unspecified: Secondary | ICD-10-CM

## 2020-07-30 DIAGNOSIS — D72829 Elevated white blood cell count, unspecified: Secondary | ICD-10-CM

## 2020-07-30 DIAGNOSIS — E119 Type 2 diabetes mellitus without complications: Secondary | ICD-10-CM

## 2020-07-30 DIAGNOSIS — I639 Cerebral infarction, unspecified: Secondary | ICD-10-CM

## 2020-07-30 DIAGNOSIS — E785 Hyperlipidemia, unspecified: Secondary | ICD-10-CM

## 2020-07-30 DIAGNOSIS — I10 Essential (primary) hypertension: Secondary | ICD-10-CM

## 2020-07-30 LAB — CBC WITH DIFFERENTIAL/PLATELET
Abs Immature Granulocytes: 0.01 10*3/uL (ref 0.00–0.07)
Basophils Absolute: 0.1 10*3/uL (ref 0.0–0.1)
Basophils Relative: 1 %
Eosinophils Absolute: 0.4 10*3/uL (ref 0.0–0.5)
Eosinophils Relative: 4 %
HCT: 33.8 % — ABNORMAL LOW (ref 36.0–46.0)
Hemoglobin: 10.9 g/dL — ABNORMAL LOW (ref 12.0–15.0)
Immature Granulocytes: 0 %
Lymphocytes Relative: 21 %
Lymphs Abs: 1.8 10*3/uL (ref 0.7–4.0)
MCH: 27.2 pg (ref 26.0–34.0)
MCHC: 32.2 g/dL (ref 30.0–36.0)
MCV: 84.3 fL (ref 80.0–100.0)
Monocytes Absolute: 0.8 10*3/uL (ref 0.1–1.0)
Monocytes Relative: 9 %
Neutro Abs: 5.5 10*3/uL (ref 1.7–7.7)
Neutrophils Relative %: 65 %
Platelets: 481 10*3/uL — ABNORMAL HIGH (ref 150–400)
RBC: 4.01 MIL/uL (ref 3.87–5.11)
RDW: 14.1 % (ref 11.5–15.5)
WBC: 8.6 10*3/uL (ref 4.0–10.5)
nRBC: 0 % (ref 0.0–0.2)

## 2020-07-30 LAB — COMPREHENSIVE METABOLIC PANEL
ALT: 15 U/L (ref 0–44)
AST: 31 U/L (ref 15–41)
Albumin: 2.2 g/dL — ABNORMAL LOW (ref 3.5–5.0)
Alkaline Phosphatase: 76 U/L (ref 38–126)
Anion gap: 10 (ref 5–15)
BUN: 12 mg/dL (ref 6–20)
CO2: 25 mmol/L (ref 22–32)
Calcium: 8.7 mg/dL — ABNORMAL LOW (ref 8.9–10.3)
Chloride: 105 mmol/L (ref 98–111)
Creatinine, Ser: 1.33 mg/dL — ABNORMAL HIGH (ref 0.44–1.00)
GFR, Estimated: 47 mL/min — ABNORMAL LOW (ref >60)
Glucose, Bld: 130 mg/dL — ABNORMAL HIGH (ref 70–99)
Potassium: 4 mmol/L (ref 3.5–5.1)
Sodium: 140 mmol/L (ref 135–145)
Total Bilirubin: 0.9 mg/dL (ref 0.3–1.2)
Total Protein: 6.2 g/dL — ABNORMAL LOW (ref 6.5–8.1)

## 2020-07-30 LAB — MAGNESIUM: Magnesium: 1.7 mg/dL (ref 1.7–2.4)

## 2020-07-30 LAB — GLUCOSE, CAPILLARY
Glucose-Capillary: 125 mg/dL — ABNORMAL HIGH (ref 70–99)
Glucose-Capillary: 119 mg/dL — ABNORMAL HIGH (ref 70–99)
Glucose-Capillary: 151 mg/dL — ABNORMAL HIGH (ref 70–99)
Glucose-Capillary: 99 mg/dL (ref 70–99)
Glucose-Capillary: 112 mg/dL — ABNORMAL HIGH (ref 70–99)

## 2020-07-30 LAB — PHOSPHORUS: Phosphorus: 3.7 mg/dL (ref 2.5–4.6)

## 2020-07-30 NOTE — Progress Notes (Signed)
  Speech Language Pathology Treatment: Dysphagia  Patient Details Name: [name] MRN: [id number] DOB: [birth year] Today's Date: 07/30/2020 Time: 8208-1388 SLP Time Calculation (min) (ACUTE ONLY): 13 min  Assessment / Plan / Recommendation Clinical Impression  Pt initially sleeping, able to wake up enough with repositioning, face washing verbal interaction, to accept a few sips of water and bite of puree without coughing or signs of difficulty other than some lingual thrusting pattern for oral transit. Recommend pt initiate puree and thins when alert; discussed methods to make pt up for meds crushed in puree at least. Will f/u for tolerance and advancement.   HPI HPI: 54 year old past history of diabetes, peripheral vascular disease status post bilateral BKA's, prior ICH with some left-sided residual stiffness and no gross weakness but some residual dysarthria, hypertension, hyperlipidemia presented for evaluation of altered mental status. CT scan concerning for new strokes compared to prior scan of 2017. Follow-up MRI revealed ischemic infarct in R ventral and dorsal thalamus, late subacute to chronic ischemic infarcts in R parieto-occipital region, R corona radiata, and L corpus callosum/corona radiata.       SLP Plan  Continue with current plan of care       Recommendations  Diet recommendations: Dysphagia 1 (puree);Thin liquid Liquids provided via: Straw Medication Administration: Crushed with puree Supervision: Full supervision/cueing for compensatory strategies Compensations: Slow rate;Small sips/bites Postural Changes and/or Swallow Maneuvers: Seated upright 90 degrees                Plan: Continue with current plan of care       GO               Herbie Baltimore, MA CCC-SLP  Acute Rehabilitation Services Pager [phone] Office [phone]  Lynann Beaver 07/30/2020, 9:12 AM

## 2020-07-30 NOTE — Plan of Care (Signed)
  Problem: Education: Goal: Knowledge of disease or condition will improve Outcome: Progressing Goal: Knowledge of secondary prevention will improve Outcome: Progressing Goal: Knowledge of patient specific risk factors addressed and post discharge goals established will improve Outcome: Progressing Goal: Individualized Educational Video(s) Outcome: Progressing   Problem: Coping: Goal: Will verbalize positive feelings about self Outcome: Progressing Goal: Will identify appropriate support needs Outcome: Progressing   Problem: Health Behavior/Discharge Planning: Goal: Ability to manage health-related needs will improve Outcome: Progressing   Problem: Self-Care: Goal: Ability to participate in self-care as condition permits will improve Outcome: Progressing

## 2020-07-30 NOTE — Progress Notes (Signed)
Physical Therapy Treatment Patient Details Name: [name] MRN: [id number] DOB: September 20, 1965 Today's Date: 07/30/2020    History of Present Illness Pt is a 54 y/o female admitted secondary to AMS. Imaging revealed R thalamic infarct and R white matter infarcts. PMH includes bilateral BKA, DM, dCHF, CKD, CAD s/p CABG.     PT Comments    Pt very lethargic this session, received Haldol earlier in morning.  Of note, pt supine in bed with B knees flexed so that B residual limbs pressing down into bed. Erythema noted end of each leg. Positioned after session with a towel roll under each leg lifting end of residual limbs off bed and promoting a flexion stretch. If this continues to be the norm for her, may need B knee immobilizers in bed. Pt required max A +2 for SL to sit due to lethargy. Pt attempted to verbalize minimally in sitting, not intelligible. Worked on sitting balance and functional activities in sitting before returning to supine. Did not attempt out of bed due to safety concerns with lethargy. Continue to rec SNF at d/c. PT will continue to follow.   Follow Up Recommendations  SNF;Supervision/Assistance - 24 hour     Equipment Recommendations  Other (comment) (TBD)    Recommendations for Other Services       Precautions / Restrictions Precautions Precautions: Fall Precaution Comments: bilateral BKA  Restrictions Weight Bearing Restrictions: No    Mobility  Bed Mobility Overal bed mobility: Needs Assistance Bed Mobility: Sit to Supine;Rolling;Sidelying to Sit Rolling: Mod assist Sidelying to sit: +2 for physical assistance;Max assist   Sit to supine: Mod assist;+2 for physical assistance   General bed mobility comments: pt assisted minimally with SL to sit but was more alert when returning to supine and did initiate lean as well as activating obliques and hips so assist with LE's into bed.   Transfers                 General transfer comment: pt too  lethargic to attempt transfer  Ambulation/Gait                 Stairs             Wheelchair Mobility    Modified Rankin (Stroke Patients Only) Modified Rankin (Stroke Patients Only) Pre-Morbid Rankin Score: Moderately severe disability Modified Rankin: Severe disability     Balance Overall balance assessment: Needs assistance Sitting-balance support: Bilateral upper extremity supported Sitting balance-Leahy Scale: Poor Sitting balance - Comments: mod A for initial sitting, once hands placed in lap pt had short durations (15-20 secs) of sitting with min-guard. Worked on trunk activation in sitting as well as participation in functional activities, ie washing face. Pt initiated this with wet washcloth but only stayed with task 15 secs, assisted by therapist after that Postural control: Posterior lean                                  Cognition Arousal/Alertness: Lethargic;Suspect due to medications Behavior During Therapy: Flat affect Overall Cognitive Status: No family/caregiver present to determine baseline cognitive functioning                                 General Comments: pt attempted to verbalize in answer to several questions but speech unintelligible      Exercises Other Exercises Other Exercises: passive knee  ext stretch in supine and in sitting, 1 min each LE in each position    General Comments General comments (skin integrity, edema, etc.): pt with B knees flexed in supine with residual limb pressed into bed, noted erythema residual limb. Pt positioned with towel rolls under residual limbs end of session to prevent WB'ing through end. Concerned for knee flexion contractures and unsure of this is her baseline. Would benefit from B knee immobilizers when in bed      Pertinent Vitals/Pain Pain Assessment: Faces Faces Pain Scale: Hurts little more Pain Location: B knees with extension stretch Pain Descriptors / Indicators:  Grimacing;Moaning Pain Intervention(s): Limited activity within patient's tolerance;Monitored during session    Home Living                      Prior Function            PT Goals (current goals can now be found in the care plan section) Acute Rehab PT Goals Patient Stated Goal: none stated PT Goal Formulation: With patient Time For Goal Achievement: 08/12/20 Potential to Achieve Goals: Fair Progress towards PT goals: Not progressing toward goals - comment (lethargy)    Frequency    Min 3X/week      PT Plan Current plan remains appropriate    Co-evaluation              AM-PAC PT "6 Clicks" Mobility   Outcome Measure  Help needed turning from your back to your side while in a flat bed without using bedrails?: A Lot Help needed moving from lying on your back to sitting on the side of a flat bed without using bedrails?: A Lot Help needed moving to and from a bed to a chair (including a wheelchair)?: Total Help needed standing up from a chair using your arms (e.g., wheelchair or bedside chair)?: Total Help needed to walk in hospital room?: Total Help needed climbing 3-5 steps with a railing? : Total 6 Click Score: 8    End of Session   Activity Tolerance: Patient limited by lethargy Patient left: in bed;with call bell/phone within reach;with bed alarm set Nurse Communication: Mobility status PT Visit Diagnosis: Difficulty in walking, not elsewhere classified (R26.2);Other abnormalities of gait and mobility (R26.89)     Time: 1610-9604 PT Time Calculation (min) (ACUTE ONLY): 23 min  Charges:  $Therapeutic Activity: 23-37 mins                     Leighton Roach, [place]  Pager [phone] Office [place] 07/30/2020, 11:03 AM

## 2020-07-30 NOTE — Progress Notes (Signed)
PROGRESS NOTE    [name]  KWI:[id number] DOB: [birth year] DOA: 07/28/2020 PCP: Mosie Lukes, MD    Brief Narrative:  This 54 years old female with medical history significant for bilateral below-knee amputations, type 2 diabetes, CKD stage IIIa, hypothyroidism, chronic diastolic CHF, Coronary artery disease,  status post CABG, polyneuropathy who presents via EMS from home with altered mental status with concern for accidental Percocet overdose.  Patient received Narcan with minimal improvement.  History is obtained from ED physician and from review of medical records.  CT head noncontrast on arrival shows subacute infarct.  Neurology was consulted,  recommended stroke work-up.  MRI and MRA head. MRI: Acute ischemic nonhemorrhagic infarct involving the ventral right thalamus. She has leukocytosis without a clear source of infection and given the concomitant presence of strokes, a CNS infection as well as infective endocarditis should be ruled out.  Echocardiogram completed ,  No vegetations.  started on vancomycin, ceftriaxone and acyclovir. Patient continued to remain stuporous lethargic, neurology reassessed the patient,  thinks patient might be having seizures, repeat CT head unchanged. Patient has stat EEG  : no evidence of seizures , started on Keppra.  Infectious disease recommended,  discontinue antibiotics.   Assessment & Plan:   Active Problems:   AMS (altered mental status)   Cerebral thrombosis with cerebral infarction   Acute CVA (cerebrovascular accident) ([place])   Leukocytosis   Acute metabolic encephalopathy could be multifactorial: Differential  could be secondary to possible CVA, opiate overuse. Patient received Narcan with minimal improvement.  UA : unremarkable, procalcitonin 0.10 CXR, no consolidative opacities.  Possible right occipital lobe subacute infarct on CT scan MRI brain and MRA head : Acute ischemic nonhemorrhagic infarct involving the ventral right  thalamus. She has leukocytosis without a clear source of infection and given the concomitant presence of strokes, a CNS infection as well as infective endocarditis should be ruled out.   Echocardiogram completed ,  No vegetations. Patient underwent spinal tap, CSF clear, no WBCs, cultures pending. Infectious disease consulted,  recommended to discontinue antibiotics. Continue acyclovir until HSV result comes back.  Acute right thalamic ischemic stroke: Stroke pathway MRI brain and MRA head : Acute ischemic nonhemorrhagic infarct involving the ventral right thalamus. 2D echocardiogram : LVEF 55 to 60%. Neurochecks every 4 hours. Speech/PT/OT evaluation Fall and aspiration precautions. Neurology thinks patient might be having a seizures.  Started on [place] EEG :  No evidence of seizures. Recommended aspirin and Plavix for 3 months.  Essential hypertension Blood pressure has improved. Resume home blood pressure medications when awake. IV Lopressor as needed  Hypothyroidism TSH normal IV levothyroxine when n.p.o.  GERD IV Protonix 40 mg daily Change to p.o. Protonix  When awake.  Bilateral below the knee amputation No significant changes. Monitor for signs of infection.   DVT prophylaxis:  Lovenox Code Status: Full Family Communication: No one at bed side. Disposition Plan:   Status is: Inpatient  Remains inpatient appropriate because:Inpatient level of care appropriate due to severity of illness   Dispo:  Patient From: Home  Planned Disposition: [place] vs SNF  Expected discharge date:   >3 DAYS                                                 Medically stable for discharge: No   Consultants:  Neurology   Procedures: Antimicrobials:  Anti-infectives (From admission, onward)   Start     Dose/Rate Route Frequency Ordered Stop   07/29/20 2000  vancomycin (VANCOREADY) IVPB 750 mg/150 mL  Status:  Discontinued        750 mg 150 mL/hr over  60 Minutes Intravenous Every 24 hours 07/29/20 0808 07/29/20 1515   07/28/20 2200  vancomycin (VANCOREADY) IVPB 500 mg/100 mL  Status:  Discontinued        500 mg 100 mL/hr over 60 Minutes Intravenous Every 12 hours 07/28/20 0644 07/29/20 0808   07/28/20 2000  cefTRIAXone (ROCEPHIN) 2 g in sodium chloride 0.9 % 100 mL IVPB  Status:  Discontinued        2 g 200 mL/hr over 30 Minutes Intravenous Every 12 hours 07/28/20 0644 07/29/20 1515   07/28/20 2000  acyclovir (ZOVIRAX) 600 mg in dextrose 5 % 100 mL IVPB        600 mg 112 mL/hr over 60 Minutes Intravenous Every 12 hours 07/28/20 0644     07/28/20 1400  ampicillin (OMNIPEN) 2 g in sodium chloride 0.9 % 100 mL IVPB  Status:  Discontinued        2 g 300 mL/hr over 20 Minutes Intravenous Every 6 hours 07/28/20 0644 07/29/20 1515   07/28/20 0700  vancomycin (VANCOREADY) IVPB 1500 mg/300 mL        1,500 mg 150 mL/hr over 120 Minutes Intravenous  Once 07/28/20 0621 07/28/20 1051   07/28/20 0700  acyclovir (ZOVIRAX) 600 mg in dextrose 5 % 100 mL IVPB        600 mg 112 mL/hr over 60 Minutes Intravenous  Once 07/28/20 0621 07/28/20 0913   07/28/20 0630  cefTRIAXone (ROCEPHIN) 2 g in sodium chloride 0.9 % 100 mL IVPB        2 g 200 mL/hr over 30 Minutes Intravenous  Once 07/28/20 0621 07/28/20 0804   07/28/20 0630  ampicillin (OMNIPEN) 2 g in sodium chloride 0.9 % 100 mL IVPB        2 g 300 mL/hr over 20 Minutes Intravenous  Once 07/28/20 9735 07/28/20 0842     Subjective: Patient was seen and examined at bedside.  Overnight events noted.   Patient was fully awake but was restless and agitated,  pulling out IV line trying to come out of the bed. Patient was given Haldol which calmed her down.  She opens eyes on calling her name. Objective: Vitals:   07/30/20 0005 07/30/20 0401 07/30/20 0812 07/30/20 1138  BP: (!) 146/118 (!) 156/105 (!) 152/83 (!) 160/75  Pulse: 87 98 81 84  Resp:  12 14 16   Temp:  97.8 F (36.6 C) 97.6 F (36.4 C) 97.9  F (36.6 C)  TempSrc:  Oral Axillary Oral  SpO2:  98% 98% 100%  Weight:      Height:        Intake/Output Summary (Last 24 hours) at 07/30/2020 1307 Last data filed at 07/30/2020 0355 Gross per 24 hour  Intake 625 ml  Output 450 ml  Net 175 ml   Filed Weights   07/28/20 0037  Weight: 60 kg    Examination:  General exam: Appears calm and comfortable, not in distress Respiratory system: Clear to auscultation. Respiratory effort normal. Cardiovascular system: S1 & S2 heard, RRR. No JVD, murmurs, rubs, gallops or clicks. No pedal edema. Gastrointestinal system: Abdomen is nondistended, soft and nontender. No organomegaly or masses felt. Normal bowel sounds heard. Central nervous system: Lethargic .  No focal neurological deficits. Extremities: Bilateral below-knee amputation Skin: No rashes, lesions or ulcers Psychiatry: Judgement and insight appear normal. Mood & affect appropriate.     Data Reviewed: I have personally reviewed following labs and imaging studies  CBC: Recent Labs  Lab 07/28/20 0150 07/28/20 0527 07/29/20 0152 07/30/20 0212  WBC 14.5* 12.0* 9.9 8.6  NEUTROABS 12.8* 10.2* 6.6 5.5  HGB 11.4* 10.9* 11.1* 10.9*  HCT 35.8* 34.2* 35.1* 33.8*  MCV 85.2 86.8 87.1 84.3  PLT 506* 476* 490* 836*   Basic Metabolic Panel: Recent Labs  Lab 07/28/20 0150 07/28/20 0527 07/29/20 0152 07/30/20 0212  NA 140 139 140 140  K 4.0 4.3 4.4 4.0  CL 100 103 105 105  CO2 29 27 26 25   GLUCOSE 113* 228* 128* 130*  BUN 16 16 14 12   CREATININE 1.17* 1.15* 1.31* 1.33*  CALCIUM 9.3 8.8* 8.7* 8.7*  MG  --   --  1.7 1.7  PHOS  --   --  4.0 3.7   GFR: Estimated Creatinine Clearance: 37.4 mL/min (A) (by C-G formula based on SCr of 1.33 mg/dL (H)). Liver Function Tests: Recent Labs  Lab 07/28/20 0150 07/29/20 0152 07/30/20 0212  AST 17 19 31   ALT 17 17 15   ALKPHOS 91 69 76  BILITOT 0.4 0.2* 0.9  PROT 7.2 6.0* 6.2*  ALBUMIN 2.6* 2.1* 2.2*   No results for  input(s): LIPASE, AMYLASE in the last 168 hours. Recent Labs  Lab 07/28/20 0150  AMMONIA 18   Coagulation Profile: No results for input(s): INR, PROTIME in the last 168 hours. Cardiac Enzymes: No results for input(s): CKTOTAL, CKMB, CKMBINDEX, TROPONINI in the last 168 hours. BNP (last 3 results) No results for input(s): PROBNP in the last 8760 hours. HbA1C: Recent Labs    07/28/20 0527  HGBA1C 10.5*   CBG: Recent Labs  Lab 07/29/20 1932 07/29/20 2318 07/30/20 0359 07/30/20 0803 07/30/20 1137  GLUCAP 161* 169* 125* 119* 151*   Lipid Profile: Recent Labs    07/28/20 0527  CHOL 271*  HDL 48  LDLCALC 199*  TRIG 118  CHOLHDL 5.6   Thyroid Function Tests: Recent Labs    07/28/20 0527  TSH 4.535*   Anemia Panel: No results for input(s): VITAMINB12, FOLATE, FERRITIN, TIBC, IRON, RETICCTPCT in the last 72 hours. Sepsis Labs: Recent Labs  Lab 07/28/20 0527  PROCALCITON <0.10    Recent Results (from the past 240 hour(s))  Respiratory Panel by RT PCR (Flu A&B, Covid) - Nasopharyngeal Swab     Status: None   Collection Time: 07/28/20  3:19 AM   Specimen: Nasopharyngeal Swab; Nasopharyngeal(NP) swabs in vial transport medium  Result Value Ref Range Status   SARS Coronavirus 2 by RT PCR NEGATIVE NEGATIVE Final    Comment: (NOTE) SARS-CoV-2 target nucleic acids are NOT DETECTED.  The SARS-CoV-2 RNA is generally detectable in upper respiratoy specimens during the acute phase of infection. The lowest concentration of SARS-CoV-2 viral copies this assay can detect is 131 copies/mL. A negative result does not preclude SARS-Cov-2 infection and should not be used as the sole basis for treatment or other patient management decisions. A negative result may occur with  improper specimen collection/handling, submission of specimen other than nasopharyngeal swab, presence of viral mutation(s) within the areas targeted by this assay, and inadequate number of viral  copies (<131 copies/mL). A negative result must be combined with clinical observations, patient history, and epidemiological information. The expected result is Negative.  Fact Sheet for  Patients:  PinkCheek.be  Fact Sheet for Healthcare Providers:  GravelBags.it  This test is no t yet approved or cleared by the [place] FDA and  has been authorized for detection and/or diagnosis of SARS-CoV-2 by FDA under an Emergency Use Authorization (EUA). This EUA will remain  in effect (meaning this test can be used) for the duration of the COVID-19 declaration under Section 564(b)(1) of the Act, 21 U.S.C. section 360bbb-3(b)(1), unless the authorization is terminated or revoked sooner.     Influenza A by PCR NEGATIVE NEGATIVE Final   Influenza B by PCR NEGATIVE NEGATIVE Final    Comment: (NOTE) The Xpert Xpress SARS-CoV-2/FLU/RSV assay is intended as an aid in  the diagnosis of influenza from Nasopharyngeal swab specimens and  should not be used as a sole basis for treatment. Nasal washings and  aspirates are unacceptable for Xpert Xpress SARS-CoV-2/FLU/RSV  testing.  Fact Sheet for Patients: PinkCheek.be  Fact Sheet for Healthcare Providers: GravelBags.it  This test is not yet approved or cleared by the [place] FDA and  has been authorized for detection and/or diagnosis of SARS-CoV-2 by  FDA under an Emergency Use Authorization (EUA). This EUA will remain  in effect (meaning this test can be used) for the duration of the  Covid-19 declaration under Section 564(b)(1) of the Act, 21  U.S.C. section 360bbb-3(b)(1), unless the authorization is  terminated or revoked. Performed at [facility], [place] [address] [place], [place] [address]   CSF culture     Status: None (Preliminary result)   Collection Time: 08/11/20  6:06 AM   Specimen: CSF; Cerebrospinal  Fluid  Result Value Ref Range Status   Specimen Description CSF  Final   Special Requests NONE  Final   Gram Stain   Final    WBC PRESENT, PREDOMINANTLY MONONUCLEAR NO ORGANISMS SEEN CYTOSPIN SMEAR    Culture   Final    NO GROWTH 2 DAYS Performed at [facility], [place] [address] [place], [place] [address]    Report Status PENDING  Incomplete  Blood culture (routine x 2)     Status: None (Preliminary result)   Collection Time: 2020/08/11  6:55 AM   Specimen: BLOOD RIGHT ARM  Result Value Ref Range Status   Specimen Description BLOOD RIGHT ARM  Final   Special Requests   Final    BOTTLES DRAWN AEROBIC AND ANAEROBIC Blood Culture adequate volume   Culture   Final    NO GROWTH 2 DAYS Performed at [facility], [place] [address], [place], [place] [address]    Report Status PENDING  Incomplete  Blood culture (routine x 2)     Status: None (Preliminary result)   Collection Time: Aug 11, 2020  7:02 AM   Specimen: BLOOD RIGHT HAND  Result Value Ref Range Status   Specimen Description BLOOD RIGHT HAND  Final   Special Requests   Final    BOTTLES DRAWN AEROBIC AND ANAEROBIC Blood Culture results may not be optimal due to an inadequate volume of blood received in culture bottles   Culture   Final    NO GROWTH 2 DAYS Performed at [facility], [place] [address], [place], [place] [address]    Report Status PENDING  Incomplete         Radiology Studies: EEG adult  Result Date: 08-11-2020 Lora Havens, MD     August 11, 2020  4:34 PM Patient Name: [name] MRN: [id number] Epilepsy Attending: Lora Havens  Referring Physician/Provider: Mikey Bussing, PA Date: 07/28/2020 Duration: 26.18 mins  atient history: 54yo F with chronic right occipital infarct, acre right thalamic infarct with worsening ams. EEG to evaluate for seizure Level of alertness: Awake, asleep AEDs during EEG study: None Technical aspects: This EEG study was done with scalp electrodes positioned  according to the 10-20 International system of electrode placement. Electrical activity was acquired at a sampling rate of 500Hz  and reviewed with a high frequency filter of 70Hz  and a low frequency filter of 1Hz . EEG data were recorded continuously and digitally stored. Description: No clear posterior dominant rhythm was seen. Sleep was characterized by vertex waves, sleep spindles (12 to 14 Hz), maximal frontocentral region. EEG showed continuous generalized  to 6 Hz theta as well as intermittent rhythmic rhythmic delta slowing. Hyperventilation and photic stimulation were not performed.   ABNORMALITY -Continued slow, generalized -Intermittent rhythmic delta slow, generalized IMPRESSION: This study is suggestive of moderate diffuse encephalopathy, nonspecific etiology. No seizures or epileptiform discharges were seen throughout the recording. Lora Havens   ECHOCARDIOGRAM COMPLETE BUBBLE STUDY  Result Date: 07/29/2020    ECHOCARDIOGRAM REPORT   Patient Name:   [name] Date of Exam: 07/29/2020 Medical Rec #:  [id number]         Height:       62.0 in Accession #:    [id number]        Weight:       132.3 lb Date of Birth:  [birth year]          BSA:          1.604 m Patient Age:    1 years          BP:           128/69 mmHg Patient Gender: F                 HR:           78 bpm. Exam Location:  Inpatient Procedure: 2D Echo and Saline Contrast Bubble Study Indications:    stroke 434.91  History:        Patient has prior history of Echocardiogram examinations, most                 recent 06/28/2018.  Sonographer:    Johny Chess Referring Phys: [id number] [place]  1. Left ventricular ejection fraction, by estimation, is 55 to 60%. The left ventricle has normal function. The left ventricle has no regional wall motion abnormalities. Left ventricular diastolic parameters are consistent with Grade II diastolic dysfunction (pseudonormalization). Elevated left ventricular end-diastolic  pressure.  2. Right ventricular systolic function is normal. The right ventricular size is normal.  3. The mitral valve is normal in structure. Trivial mitral valve regurgitation. No evidence of mitral stenosis.  4. The aortic valve is tricuspid. Aortic valve regurgitation is not visualized. Mild aortic valve sclerosis is present, with no evidence of aortic valve stenosis.  5. The inferior vena cava is normal in size with greater than 50% respiratory variability, suggesting right atrial pressure of 3 mmHg.  6. Agitated saline contrast bubble study was negative, with no evidence of any interatrial shunt. FINDINGS  Left Ventricle: Left ventricular ejection fraction, by estimation, is 55 to 60%. The left ventricle has normal function. The left ventricle has no regional wall motion abnormalities. The left ventricular internal cavity size was normal in size. There is  no left ventricular hypertrophy. Left ventricular diastolic parameters are consistent with  Grade II diastolic dysfunction (pseudonormalization). Elevated left ventricular end-diastolic pressure. Right Ventricle: The right ventricular size is normal. No increase in right ventricular wall thickness. Right ventricular systolic function is normal. Left Atrium: Left atrial size was normal in size. Right Atrium: Right atrial size was normal in size. Pericardium: There is no evidence of pericardial effusion. Mitral Valve: The mitral valve is normal in structure. There is mild thickening of the mitral valve leaflet(s). There is mild calcification of the mitral valve leaflet(s). Mild mitral annular calcification. Trivial mitral valve regurgitation. No evidence  of mitral valve stenosis. Tricuspid Valve: The tricuspid valve is normal in structure. Tricuspid valve regurgitation is trivial. No evidence of tricuspid stenosis. Aortic Valve: The aortic valve is tricuspid. Aortic valve regurgitation is not visualized. Mild aortic valve sclerosis is present, with no  evidence of aortic valve stenosis. Pulmonic Valve: The pulmonic valve was normal in structure. Pulmonic valve regurgitation is not visualized. No evidence of pulmonic stenosis. Aorta: The aortic root is normal in size and structure. Venous: The inferior vena cava is normal in size with greater than 50% respiratory variability, suggesting right atrial pressure of 3 mmHg. IAS/Shunts: No atrial level shunt detected by color flow Doppler. Agitated saline contrast was given intravenously to evaluate for intracardiac shunting. Agitated saline contrast bubble study was negative, with no evidence of any interatrial shunt.  LEFT VENTRICLE PLAX 2D LVIDd:         4.00 cm  Diastology LVIDs:         2.70 cm  LV e' medial:    3.92 cm/s LV PW:         1.10 cm  LV E/e' medial:  22.1 LV IVS:        1.00 cm  LV e' lateral:   7.29 cm/s LVOT diam:     1.70 cm  LV E/e' lateral: 11.9 LV SV:         33 LV SV Index:   21 LVOT Area:     2.27 cm  RIGHT VENTRICLE            IVC RV S prime:     9.57 cm/s  IVC diam: 1.50 cm TAPSE (M-mode): 2.4 cm LEFT ATRIUM             Index       RIGHT ATRIUM          Index LA diam:        2.90 cm 1.81 cm/m  RA Area:     8.52 cm LA Vol (A2C):   28.3 ml 17.65 ml/m RA Volume:   14.80 ml 9.23 ml/m LA Vol (A4C):   29.0 ml 18.08 ml/m LA Biplane Vol: 30.0 ml 18.71 ml/m  AORTIC VALVE LVOT Vmax:   75.10 cm/s LVOT Vmean:  47.100 cm/s LVOT VTI:    0.146 m  AORTA Ao Root diam: 2.70 cm Ao Asc diam:  3.00 cm MITRAL VALVE MV Area (PHT): 3.53 cm    SHUNTS MV Decel Time: 215 msec    Systemic VTI:  0.15 m MV E velocity: 86.50 cm/s  Systemic Diam: 1.70 cm MV A velocity: 99.40 cm/s MV E/A ratio:  0.87 Jenkins Rouge MD Electronically signed by Jenkins Rouge MD Signature Date/Time: 07/29/2020/9:21:25 AM    Final    VAS US CAROTID  Result Date: 07/29/2020 Carotid Arterial Duplex Study Indications:       CVA. Risk Factors:      Hypertension, hyperlipidemia, Diabetes, coronary artery  disease, prior CVA.  Comparison Study:  04/01/16 previous Performing Technologist: Abram Sander RVS  Examination Guidelines: A complete evaluation includes B-mode imaging, spectral Doppler, color Doppler, and power Doppler as needed of all accessible portions of each vessel. Bilateral testing is considered an integral part of a complete examination. Limited examinations for reoccurring indications may be performed as noted.  Right Carotid Findings: +----------+--------+--------+--------+------------------+--------+           PSV cm/sEDV cm/sStenosisPlaque DescriptionComments +----------+--------+--------+--------+------------------+--------+ CCA Prox  61      13              heterogenous               +----------+--------+--------+--------+------------------+--------+ CCA Distal60      15              heterogenous               +----------+--------+--------+--------+------------------+--------+ ICA Prox  102     26      1-39%   heterogenous               +----------+--------+--------+--------+------------------+--------+ ICA Distal64      14                                         +----------+--------+--------+--------+------------------+--------+ ECA       135     15                                         +----------+--------+--------+--------+------------------+--------+ +----------+--------+-------+--------+-------------------+           PSV cm/sEDV cmsDescribeArm Pressure (mmHG) +----------+--------+-------+--------+-------------------+ [id number]                                         +----------+--------+-------+--------+-------------------+ +---------+--------+--+--------+-+---------+ VertebralPSV cm/s24EDV cm/s5Antegrade +---------+--------+--+--------+-+---------+  Left Carotid Findings: +----------+--------+--------+--------+------------------+--------+           PSV cm/sEDV cm/sStenosisPlaque DescriptionComments  +----------+--------+--------+--------+------------------+--------+ CCA Prox  94      18              heterogenous               +----------+--------+--------+--------+------------------+--------+ CCA Distal80      15              heterogenous               +----------+--------+--------+--------+------------------+--------+ ICA Prox  82      24      1-39%   heterogenous               +----------+--------+--------+--------+------------------+--------+ ICA Distal60      17                                         +----------+--------+--------+--------+------------------+--------+ ECA       116     14                                         +----------+--------+--------+--------+------------------+--------+ +----------+--------+--------+--------+-------------------+  PSV cm/sEDV cm/sDescribeArm Pressure (mmHG) +----------+--------+--------+--------+-------------------+ [id number]                                         +----------+--------+--------+--------+-------------------+ +---------+--------+--+--------+-+---------+ VertebralPSV cm/s29EDV cm/s7Antegrade +---------+--------+--+--------+-+---------+   Summary: Right Carotid: Velocities in the right ICA are consistent with a 1-39% stenosis. Left Carotid: Velocities in the left ICA are consistent with a 1-39% stenosis. Vertebrals: Bilateral vertebral arteries demonstrate antegrade flow. *See table(s) above for measurements and observations.  Electronically signed by Deitra Mayo MD on 07/29/2020 at 6:22:54 PM.    Final    Scheduled Meds: . aspirin  300 mg Rectal Daily  . clopidogrel  75 mg Oral Q breakfast  . enoxaparin (LOVENOX) injection  40 mg Subcutaneous Daily  . insulin aspart  0-9 Units Subcutaneous Q4H  . [START ON 07/31/2020] levothyroxine  12.5 mcg Intravenous Daily  . metoprolol tartrate  2.5 mg Intravenous Q6H  . pantoprazole (PROTONIX) IV  40 mg Intravenous Q24H    Continuous Infusions: . sodium chloride 50 mL/hr at 07/29/20 0935  . acyclovir 600 mg (07/30/20 0827)     LOS: 2 days    Time spent: 25 mins    Arlesia Kiel, MD [facility]   If 7PM-7AM, please contact night-coverage

## 2020-07-30 NOTE — Progress Notes (Signed)
STROKE TEAM PROGRESS NOTE      INTERVAL HISTORY Patient remains lethargic and barely arousable.  She mumbles but does not follow commands consistently.  No witnessed seizure activity.  EEG showed diffuse encephalopathy but no seizure activity.  Comprehensive metabolic panel lab today is mostly unremarkable except low albumin.  CBC unremarkable. CSF is mostly benign except for isolated elevated protein with normal cells.  CSF herpes PCR is pending.   OBJECTIVE Vitals:   07/30/20 0005 07/30/20 0401 07/30/20 0812 07/30/20 1138  BP: (!) 146/118 (!) 156/105 (!) 152/83 (!) 160/75  Pulse: 87 98 81 84  Resp:  12 14 16   Temp:  97.8 F (36.6 C) 97.6 F (36.4 C) 97.9 F (36.6 C)  TempSrc:  Oral Axillary Oral  SpO2:  98% 98% 100%  Weight:      Height:        CBC:  Recent Labs  Lab 07/29/20 0152 07/30/20 0212  WBC 9.9 8.6  NEUTROABS 6.6 5.5  HGB 11.1* 10.9*  HCT 35.1* 33.8*  MCV 87.1 84.3  PLT 490* 481*    Basic Metabolic Panel:  Recent Labs  Lab 07/29/20 0152 07/30/20 0212  NA 140 140  K 4.4 4.0  CL 105 105  CO2 26 25  GLUCOSE 128* 130*  BUN 14 12  CREATININE 1.31* 1.33*  CALCIUM 8.7* 8.7*  MG 1.7 1.7  PHOS 4.0 3.7    Lipid Panel:     Component Value Date/Time   CHOL 271 (H) 07/28/2020 0527   TRIG 118 07/28/2020 0527   HDL 48 07/28/2020 0527   CHOLHDL 5.6 07/28/2020 0527   VLDL 24 07/28/2020 0527   LDLCALC 199 (H) 07/28/2020 0527   HgbA1c:  Lab Results  Component Value Date   HGBA1C 10.5 (H) 07/28/2020   Urine Drug Screen:     Component Value Date/Time   LABOPIA NONE DETECTED 07/28/2020 0300   COCAINSCRNUR NONE DETECTED 07/28/2020 0300   LABBENZ NONE DETECTED 07/28/2020 0300   AMPHETMU NONE DETECTED 07/28/2020 0300   THCU NONE DETECTED 07/28/2020 0300   LABBARB NONE DETECTED 07/28/2020 0300    Alcohol Level     Component Value Date/Time   ETH <10 07/28/2020 0150    IMAGING  CT Head Wo Contrast 07/28/2020 IMPRESSION:  1. Geographic  cortically based hypoattenuation situated right occipital lobe is new from comparison imaging and may reflect a subacute infarct.  2. New hypoattenuating foci in the right thalamus and basal ganglia could reflect sequela of age-indeterminate lacunar type infarction new from 2017. A similar focus in the left insula is unchanged from comparison.  3. Chronic microvascular angiopathy and parenchymal volume loss.   MR ANGIO HEAD WO CONTRAST  07/28/2020 IMPRESSION:   MRI HEAD  IMPRESSION:  1. 7 mm acute ischemic nonhemorrhagic infarct involving the ventral right thalamus.  2. Additional 11 mm acute to early subacute ischemic nonhemorrhagic right periatrial white matter infarcts, with extension to involve the adjacent dorsal right thalamus.  3. Late subacute to chronic ischemic infarcts involving the right parieto-occipital region, right corona radiata, and anterior left corpus callosum/corona radiata.  4. Underlying moderate chronic small vessel ischemic disease with multiple remote lacunar infarcts as above.   MRA HEAD  IMPRESSION:  1. Negative intracranial MRA for large vessel occlusion.  2. Focal severe proximal right P2 stenosis, likely accounting for the extensive right PCA distribution ischemic changes.  3. Additional severe stenosis at the right MCA bifurcation to involve the adjacent proximal M2 segments.  4. Moderate distal  small vessel atheromatous irregularity throughout the intracranial circulation.   DG Chest Portable 1 View 07/28/2020 IMPRESSION:  1. Low lung volumes, atelectasis  2. Mild vascular congestion without features of frank edema.  3. Prior sternotomy and CABG.   Transthoracic Echocardiogram  07/29/2020 IMPRESSIONS  1. Left ventricular ejection fraction, by estimation, is 55 to 60%. The  left ventricle has normal function. The left ventricle has no regional  wall motion abnormalities. Left ventricular diastolic parameters are  consistent with Grade II diastolic   dysfunction (pseudonormalization). Elevated left ventricular end-diastolic  pressure.  2. Right ventricular systolic function is normal. The right ventricular  size is normal.  3. The mitral valve is normal in structure. Trivial mitral valve  regurgitation. No evidence of mitral stenosis.  4. The aortic valve is tricuspid. Aortic valve regurgitation is not  visualized. Mild aortic valve sclerosis is present, with no evidence of  aortic valve stenosis.  5. The inferior vena cava is normal in size with greater than 50%  respiratory variability, suggesting right atrial pressure of 3 mmHg.  6. Agitated saline contrast bubble study was negative, with no evidence  of any interatrial shunt.   Bilateral Carotid Dopplers  11/21//2021 Summary:  Right Carotid: Velocities in the right ICA are consistent with a 1-39% stenosis.  Left Carotid: Velocities in the left ICA are consistent with a 1-39% stenosis.  Vertebrals: Bilateral vertebral arteries demonstrate antegrade flow.   ECG - SR rate 95 BPM. (See cardiology reading for complete details)  EEG 07/28/2020 ABNORMALITY -Continued slow, generalized -Intermittent rhythmic delta slow, generalized IMPRESSION: This study is suggestive of moderate diffuse encephalopathy, nonspecific etiology. No seizures or epileptiform discharges were seen throughout the recording.  PHYSICAL EXAM Blood pressure (!) 160/75, pulse 84, temperature 97.9 F (36.6 C), temperature source Oral, resp. rate 16, height 5\' 2"  (1.575 m), weight 60 kg, last menstrual period 02/12/2011, SpO2 100 %.  GENERAL: Middle-aged Caucasian lady not in distress. HEENT: Edentulous  ABDOMEN: soft  EXTREMITIES: No edema; bilateral leg amputation  BACK: Normal  SKIN: Normal by inspection.    MENTAL STATUS:   CRANIAL NERVES: Pupils are equal, round and reactive to light but clearly she has poor vision and only sees shadows on exam testing at the bedside; extra ocular  movements are full, there is no significant nystagmus; upper and lower facial muscles are normal in strength and symmetric, there is no flattening of the nasolabial folds; tongue is midline; uvula is midline.  MOTOR: Normal tone, bulk and strength in the upper extremities; no pronator drift.  COORDINATION: Left finger to nose is normal, right finger to nose is normal, No rest tremor; no intention tremor; no postural tremor; no bradykinesia.  SENSATION: Withdraws all 4 extremities to painful stimuli equally  Gait not tested       ASSESSMENT/PLAN [name] is a 54 y.o. female with history of diabetes, peripheral vascular disease status post bilateral BKA's, prior ICH with some residual left sided stiffness but no gross weakness, CAD, hypertension, hyperlipidemia, and some dysarthria at baseline presented to the emergency room for altered mental status, poorly responsive, concern for possible opioid use, leukocytosis, and head CT revealing possible stroke. She did not receive IV t-PA due to late presentation (>4.5 hours from time of onset).   Stroke: acute ischemic nonhemorrhagic infarct involving the ventral right thalamus. Additional 11 mm acute to early subacute ischemic nonhemorrhagic right periatrial white matter infarcts, with extension to involve the adjacent dorsal right thalamus.  Etiology likely small  vessel disease but mental status appears to be out of proportion to the strokes and suspect overlying encephalopathy.  Possibly medication effect of ceftriaxone and Keppra  Resultant altered mental status.  Code Stroke CT Head - not ordered   CT head - Geographic cortically based hypoattenuation situated right occipital lobe is new from comparison imaging and may reflect a subacute infarct. New hypoattenuating foci in the right thalamus and basal ganglia could reflect sequela of age-indeterminate lacunar type infarction new from 2017. A similar focus in the left insula is  unchanged from comparison.   MRI head - 7 mm acute ischemic nonhemorrhagic infarct involving the ventral right thalamus. Additional 11 mm acute to early subacute ischemic nonhemorrhagic right periatrial white matter infarcts, with extension to involve the adjacent dorsal right thalamus. Late subacute to chronic ischemic infarcts involving the right parieto-occipital region, right corona radiata, and anterior left corpus callosum/corona radiata.   MRA head - Focal severe proximal right P2 stenosis, likely accounting for the extensive right PCA distribution ischemic changes. Additional severe stenosis at the right MCA bifurcation to involve the adjacent proximal M2 segments.    EEG - 07/28/2020 - This study is suggestive of moderate diffuse encephalopathy, nonspecific etiology. No seizures or epileptiform discharges were seen throughout the recording.  CTA H&N - not ordered  CT Perfusion - not ordered  Carotid Doppler - unremarkable  2D Echo - EF 55 - 60%. No cardiac source of emboli identified.   Sars Corona Virus 2 - negative  LDL - 199  HgbA1c - 10.5  UDS - negative  VTE prophylaxis - Lovenox Diet  Diet Order            DIET - DYS 1 Room service appropriate? Yes; Fluid consistency: Thin  Diet effective now                 No antithrombotic prior to admission, start asa 81 mg per Dr Johny Chess note -> ASA not started due to NPO status and change in neuro exam -> CT Head - stable - no bleeding - start aspirin suppositories 300 mg daily although there is some concern about possible endocarditis. Plavix ordered yesterday as well but pt NPO.  Patient counseled to be compliant with her antithrombotic medications  Ongoing aggressive stroke risk factor management  Therapy recommendations:  New Home placement recommended by therapists  Disposition:  Pending  Hypertension  Home BP meds: Cozaar ; Norvasc  Current BP meds: metoprolol  Stable . Permissive  hypertension (OK if < 220/120) but gradually normalize in 5-7 days . Long-term BP goal normotensive  Hyperlipidemia  Home Lipid lowering medication: Lipitor 80 mg daily  LDL 199, goal < 70  Current lipid lowering medication: none - consider Lipitor 80 mg daily if no contra indication  Continue statin at discharge  Diabetes  Home diabetic meds: Insulin ; metformin  Current diabetic meds: SSI   HgbA1c 10.5, goal < 7.0 Recent Labs    07/30/20 0359 07/30/20 0803 07/30/20 1137  GLUCAP 125* 119* 151*    Acute Encephalopathy  Lumbar puncture - 07/28/20 - cultures no growth day 1 ; Glucose 110 (H) ; Total protein 66 (H)  Blood cultures - no growth day 1   Vancomycin ; Rocephin ; Ampicillin ; Zovirax - started 11/20 for possible meningitis  Leukocytosis - WBC's - 14.5->12.0->9.9 (afebrile)    Persistent and recurrent worsening of encephalopathy.  Work-up unrevealing but concerns for status epilepticus.  Improve with loading dose of Keppra.  No  evidence of meningitis or other forms of infectious etiology.  Antibiotics are all discontinued except for the antiviral agents acyclovir which will likely be discontinued once the HSV PCR returns.  Other Stroke Risk Factors  Former cigarette smoker - quit  Hx stroke/TIA - prior ICH  Migraines  Congestive Heart Failure  Previous substance abuse  CAD  Other Active Problems  Code status - Full code  Suspected seizures - Keppra given yesterday only. EEG as above.  CKD - stage 3a  - creatinine - 1.15->1.31    Hospital day # 2 The patient's mental status appears out of proportion to her strokes and there may be encephalopathic component from antibiotics and Keppra.  Since EEG shows no clear seizure activity recommend discontinue Keppra and antibiotics.  Await CSF herpes PCR's results.  Discussed with Dr. Dwyane Dee. continue ongoing management.  Greater than 50% time during this 35-minute visit was spent on counseling and  coordination of care about her presentation and MRI findings and answered questions. Antony Contras, MD  To contact Stroke Continuity provider, please refer to [email or web address] After hours, contact General Neurology

## 2020-07-30 NOTE — Consult Note (Signed)
[facility]    Date of Admission:  07/28/2020     Total days of antibiotics: 3 Current antibiotics: Day 3 acyclocir  Reason for Consult: meningitis/encephalitis    Referring Physician: Dr Dwyane Dee  Assessment: [name] is a 54 y.o. female.  She has a complicated PMHx and presents 11/20 with encephalopathy and acute ischemic infarcts.  There was concern for infection due to leukocytosis in the setting of her encephalopathy but this has now normalized.  LP unremarkable without white cells.  Neurology has concerns for status epilepticus but no evidence of meningitis or infectious etiology.  Broad spectrum antibiotics were stopped and she is currently on acyclovir pending HSV CSF studies.  Unfortunately, PCR not sent and only antibodies from CSF were sent.   ID issues: # Encephalopathy # Leukocytosis, now normalized  Other issues: # CVA # Suspected seizures # HTN, HLD, DM  Recommendations: --suspicion for infection is low.  Agree with holding further abx given her bland CSF.   --HSV antibody studies pending.  Unfortunately HSV PCR not done.  On acyclovir pending this result.  Likely stop in the next day or so     HPI:  [name] is a 54 year old woman with past medical history of diabetes mellitus, peripheral vascular disease, bilateral BKA, prior intracerebral hemorrhage with residual left-sided stiffness, hypertension, hyperlipidemia who presented November 20 to the emergency department for encephalopathy.  According to initial neurology consult note patient was last seen normal around 7:30 PM on November 19.  She has some dysarthria at her baseline but apparently on that evening she was found in the bathroom with evidence of having urinated on herself and was unable to respond.  There was some concern for opiate overdose due to oxycodone being found in her presence.  She was brought to the emergency department at that time.  Noncontrast head  CT showed concern for right occipital stroke and right thalamic stroke both new from imaging in 2017.  Her initial labs were significant for WBC 14.5, hemoglobin 11.4, platelets 506.  Creatinine 1.17, normal LFTs, normal electrolytes.  MRI head showed ischemic nonhemorrhagic infarct involving the ventral right thalamus.  She was initially placed on broad spectrum antibiotics with vancomycin, ceftriaxone, ampicillin, acyclovir.  Now she is just on acyclovir. Leukocytosis has down trended from 14.5 at admission to 8.6 today.  Blood cultures from admission are negative at 2 days.  She underwent lumbar puncture which showed 2 white blood cells, glucose 110, protein 66.  CSF cultures are no growth.  HSV PCR not sent, but instead IgG/IgM antibodies were done and are pending.  Of note, these antibiotics were started after obtaining CSF.  Transthoracic echocardiogram showed no evidence of vegetation.   Past Medical History:  Diagnosis Date  . Abscess of great toe, right   . Acute on chronic diastolic CHF (congestive heart failure) ([place]) 06/24/2018  . Acute osteomyelitis of toe, left ([place]) 09/05/2016  . Amputation of right great toe ([place]) 12/22/2018  . Anxiety   . Cataract    left - surgery to remove  . Cellulitis of foot, right 12/11/2018  . COMMON MIGRAINE 10/07/2010  . Coronary artery disease   . Decreased visual acuity 11/10/2016  . Depression 12/18/2012  . Diabetes mellitus type II, uncontrolled ([place]) 10/07/2010   Qualifier: Diagnosis of  By: Charlett Blake MD, [name]    . Diabetic foot infection ([place]) 08/26/2016  . Diabetic infection of right foot ([place])   . Disturbance of skin  sensation 10/07/2010  . Gangrene of right foot ([place])   . GERD (gastroesophageal reflux disease)   . Heart murmur   . History of kidney stones    "years ago" - passed stones  . Hyperlipidemia 12/06/2010  . Hypertension   . Hypothyroidism   . Lipoma of abdominal wall 10/05/2016  . Overweight(278.02) 12/06/2010  . PERIPHERAL NEUROPATHY,  FEET 10/07/2010   11/17/2019  . PVD (peripheral vascular disease) ([place]) 01/21/2012  . RESTLESS LEG SYNDROME 10/25/2010  . Stroke [facility]) 2014, 2017   most recently in 2/17 - intracerebral hemorrhage    Social History   Tobacco Use  . Smoking status: Former Smoker    Packs/day: 0.50    Quit date: 09/08/2005    Years since quitting: 14.9  . Smokeless tobacco: Never Used  . Tobacco comment: " 1 smoke a week in the past"  Vaping Use  . Vaping Use: Never used  Substance Use Topics  . Alcohol use: No    Alcohol/week: 0.0 standard drinks  . Drug use: Not Currently    Family History  Problem Relation Age of Onset  . Arthritis Mother   . Stroke Brother        previous smoker  . Alcohol abuse Brother        in remission  . Leukemia Brother   . Diabetes Paternal Grandmother   . Healthy Son     Allergies  Allergen Reactions  . Lyrica [Pregabalin] Other (See Comments)    MADE PATIENT VERY EMOTIONAL AND WOULD CRY EASILY  . Morphine And Related Nausea And Vomiting  . Penicillins Other (See Comments)    GI upset related to Augmentin use  Did it involve swelling of the face/tongue/throat, SOB, or low BP? No Did it involve sudden or severe rash/hives, skin peeling, or any reaction on the inside of your mouth or nose? No Did you need to seek medical attention at a hospital or doctor's office? No When did it last happen?April 2020 If all above answers are "NO", may proceed with cephalosporin use.   . Tramadol Nausea And Vomiting    Pt cant tolerate this pain med.      Review of Systems: Review of Systems  Unable to perform ROS: Mental status change     Objective: Blood pressure (!) 160/75, pulse 84, temperature 97.9 F (36.6 C), temperature source Oral, resp. rate 16, height 5\' 2"  (1.575 m), weight 60 kg, last menstrual period 02/12/2011, SpO2 100 %. Body mass index is 24.19 kg/m.  Physical Exam Constitutional:      Comments: Chronically ill appearing woman lying in  bed.    HENT:     Head: Normocephalic and atraumatic.  Eyes:     Comments: Eyes closed.   Cardiovascular:     Rate and Rhythm: Normal rate and regular rhythm.     Comments: Prior CABG scar.  Pulmonary:     Effort: Pulmonary effort is normal.     Breath sounds: Normal breath sounds. No wheezing or rales.  Abdominal:     General: Abdomen is flat. There is no distension.     Palpations: Abdomen is soft.     Tenderness: There is no abdominal tenderness. There is no guarding.  Musculoskeletal:     Cervical back: Neck supple. No rigidity.     Comments: Bilateral BKA  Skin:    General: Skin is warm and dry.     Coloration: Skin is not jaundiced.  Neurological:     Comments: No obvious  focal deficit.  She is difficult to arouse and is lethargic but able to follow simple command such as stick out her tongue.   Psychiatric:     Comments: Unable to assess due to her mental status changes.      Lab Results & Microbiology Lab Results  Component Value Date   WBC 8.6 07/30/2020   HGB 10.9 (L) 07/30/2020   HCT 33.8 (L) 07/30/2020   MCV 84.3 07/30/2020   PLT 481 (H) 07/30/2020    Lab Results  Component Value Date   NA 140 07/30/2020   K 4.0 07/30/2020   CO2 25 07/30/2020   GLUCOSE 130 (H) 07/30/2020   BUN 12 07/30/2020   CREATININE 1.33 (H) 07/30/2020   CALCIUM 8.7 (L) 07/30/2020   GFRNONAA 47 (L) 07/30/2020   GFRAA 40 (L) 10/11/2019    Lab Results  Component Value Date   ALT 15 07/30/2020   AST 31 07/30/2020   ALKPHOS 76 07/30/2020   BILITOT 0.9 07/30/2020     I have reviewed the micro and lab results in Epic.  Imaging CT HEAD WO CONTRAST  Result Date: 07/28/2020 CLINICAL DATA:  54 year old female with altered mental status, right thalamic lacunar infarct, subacute ischemia elsewhere in the right PCA territory on MRI this morning. Right MCA and PCA stenoses on MRA. EXAM: CT HEAD WITHOUT CONTRAST TECHNIQUE: Contiguous axial images were obtained from the base of the skull  through the vertex without intravenous contrast. COMPARISON:  Brain MRI, intracranial MRA, plain head CT earlier today. FINDINGS: Brain: No midline shift, mass effect, or evidence of intracranial mass lesion. No ventriculomegaly. No acute intracranial hemorrhage identified. Patchy hypodensity in the right thalamus and elsewhere in the right PCA territory corresponding to a mix of acute, subacute, and chronic ischemia. Unchanged CT appearance from 0128 hours today. Superimposed chronic small infarcts also in the right basal ganglia/internal capsule, right cerebellum. Vascular: Calcified atherosclerosis at the skull base. No suspicious intracranial vascular hyperdensity. Skull: No acute osseous abnormality identified. Sinuses/Orbits: Visualized paranasal sinuses and mastoids are stable. Other: No acute orbit or scalp soft tissue finding. IMPRESSION: 1. Stable CT appearance of the brain from earlier today today with a mix of acute, subacute, and chronic ischemia in the Right PCA territory. No associated hemorrhage or mass effect. 2. Chronic small-vessel ischemia elsewhere. No new intracranial abnormality. Electronically Signed   By: Genevie Ann M.D.   On: 07/28/2020 12:25   EEG adult  Result Date: 07/28/2020 Lora Havens, MD     07/28/2020  4:34 PM Patient Name: KORALEE WEDEKING MRN: [id number] Epilepsy Attending: Lora Havens Referring Physician/Provider: Mikey Bussing, PA Date: 07/28/2020 Duration: 26.18 mins  atient history: 54yo F with chronic right occipital infarct, acre right thalamic infarct with worsening ams. EEG to evaluate for seizure Level of alertness: Awake, asleep AEDs during EEG study: None Technical aspects: This EEG study was done with scalp electrodes positioned according to the 10-20 International system of electrode placement. Electrical activity was acquired at a sampling rate of 500Hz  and reviewed with a high frequency filter of 70Hz  and a low frequency filter of 1Hz . EEG data were  recorded continuously and digitally stored. Description: No clear posterior dominant rhythm was seen. Sleep was characterized by vertex waves, sleep spindles (12 to 14 Hz), maximal frontocentral region. EEG showed continuous generalized  to 6 Hz theta as well as intermittent rhythmic rhythmic delta slowing. Hyperventilation and photic stimulation were not performed.   ABNORMALITY -Continued slow, generalized -Intermittent rhythmic delta  slow, generalized IMPRESSION: This study is suggestive of moderate diffuse encephalopathy, nonspecific etiology. No seizures or epileptiform discharges were seen throughout the recording. Lora Havens   ECHOCARDIOGRAM COMPLETE BUBBLE STUDY  Result Date: 07/29/2020    ECHOCARDIOGRAM REPORT   Patient Name:   PAMLEA FINDER Date of Exam: 07/29/2020 Medical Rec #:  [id number]         Height:       62.0 in Accession #:    [id number]        Weight:       132.3 lb Date of Birth:  Mar 05, 1966          BSA:          1.604 m Patient Age:    1 years          BP:           128/69 mmHg Patient Gender: F                 HR:           78 bpm. Exam Location:  Inpatient Procedure: 2D Echo and Saline Contrast Bubble Study Indications:    stroke 434.91  History:        Patient has prior history of Echocardiogram examinations, most                 recent 06/28/2018.  Sonographer:    Johny Chess Referring Phys: [id number] [place]  1. Left ventricular ejection fraction, by estimation, is 55 to 60%. The left ventricle has normal function. The left ventricle has no regional wall motion abnormalities. Left ventricular diastolic parameters are consistent with Grade II diastolic dysfunction (pseudonormalization). Elevated left ventricular end-diastolic pressure.  2. Right ventricular systolic function is normal. The right ventricular size is normal.  3. The mitral valve is normal in structure. Trivial mitral valve regurgitation. No evidence of mitral stenosis.  4. The aortic  valve is tricuspid. Aortic valve regurgitation is not visualized. Mild aortic valve sclerosis is present, with no evidence of aortic valve stenosis.  5. The inferior vena cava is normal in size with greater than 50% respiratory variability, suggesting right atrial pressure of 3 mmHg.  6. Agitated saline contrast bubble study was negative, with no evidence of any interatrial shunt. FINDINGS  Left Ventricle: Left ventricular ejection fraction, by estimation, is 55 to 60%. The left ventricle has normal function. The left ventricle has no regional wall motion abnormalities. The left ventricular internal cavity size was normal in size. There is  no left ventricular hypertrophy. Left ventricular diastolic parameters are consistent with Grade II diastolic dysfunction (pseudonormalization). Elevated left ventricular end-diastolic pressure. Right Ventricle: The right ventricular size is normal. No increase in right ventricular wall thickness. Right ventricular systolic function is normal. Left Atrium: Left atrial size was normal in size. Right Atrium: Right atrial size was normal in size. Pericardium: There is no evidence of pericardial effusion. Mitral Valve: The mitral valve is normal in structure. There is mild thickening of the mitral valve leaflet(s). There is mild calcification of the mitral valve leaflet(s). Mild mitral annular calcification. Trivial mitral valve regurgitation. No evidence  of mitral valve stenosis. Tricuspid Valve: The tricuspid valve is normal in structure. Tricuspid valve regurgitation is trivial. No evidence of tricuspid stenosis. Aortic Valve: The aortic valve is tricuspid. Aortic valve regurgitation is not visualized. Mild aortic valve sclerosis is present, with no evidence of aortic valve stenosis. Pulmonic Valve: The pulmonic valve was normal in structure.  Pulmonic valve regurgitation is not visualized. No evidence of pulmonic stenosis. Aorta: The aortic root is normal in size and structure.  Venous: The inferior vena cava is normal in size with greater than 50% respiratory variability, suggesting right atrial pressure of 3 mmHg. IAS/Shunts: No atrial level shunt detected by color flow Doppler. Agitated saline contrast was given intravenously to evaluate for intracardiac shunting. Agitated saline contrast bubble study was negative, with no evidence of any interatrial shunt.  LEFT VENTRICLE PLAX 2D LVIDd:         4.00 cm  Diastology LVIDs:         2.70 cm  LV e' medial:    3.92 cm/s LV PW:         1.10 cm  LV E/e' medial:  22.1 LV IVS:        1.00 cm  LV e' lateral:   7.29 cm/s LVOT diam:     1.70 cm  LV E/e' lateral: 11.9 LV SV:         33 LV SV Index:   21 LVOT Area:     2.27 cm  RIGHT VENTRICLE            IVC RV S prime:     9.57 cm/s  IVC diam: 1.50 cm TAPSE (M-mode): 2.4 cm LEFT ATRIUM             Index       RIGHT ATRIUM          Index LA diam:        2.90 cm 1.81 cm/m  RA Area:     8.52 cm LA Vol (A2C):   28.3 ml 17.65 ml/m RA Volume:   14.80 ml 9.23 ml/m LA Vol (A4C):   29.0 ml 18.08 ml/m LA Biplane Vol: 30.0 ml 18.71 ml/m  AORTIC VALVE LVOT Vmax:   75.10 cm/s LVOT Vmean:  47.100 cm/s LVOT VTI:    0.146 m  AORTA Ao Root diam: 2.70 cm Ao Asc diam:  3.00 cm MITRAL VALVE MV Area (PHT): 3.53 cm    SHUNTS MV Decel Time: 215 msec    Systemic VTI:  0.15 m MV E velocity: 86.50 cm/s  Systemic Diam: 1.70 cm MV A velocity: 99.40 cm/s MV E/A ratio:  0.87 Jenkins Rouge MD Electronically signed by Jenkins Rouge MD Signature Date/Time: 07/29/2020/9:21:25 AM    Final    VAS US CAROTID  Result Date: 07/29/2020 Carotid Arterial Duplex Study Indications:       CVA. Risk Factors:      Hypertension, hyperlipidemia, Diabetes, coronary artery                    disease, prior CVA. Comparison Study:  04/01/16 previous Performing Technologist: Abram Sander RVS  Examination Guidelines: A complete evaluation includes B-mode imaging, spectral Doppler, color Doppler, and power Doppler as needed of all accessible  portions of each vessel. Bilateral testing is considered an integral part of a complete examination. Limited examinations for reoccurring indications may be performed as noted.  Right Carotid Findings: +----------+--------+--------+--------+------------------+--------+           PSV cm/sEDV cm/sStenosisPlaque DescriptionComments +----------+--------+--------+--------+------------------+--------+ CCA Prox  61      13              heterogenous               +----------+--------+--------+--------+------------------+--------+ CCA Distal60      15  heterogenous               +----------+--------+--------+--------+------------------+--------+ ICA Prox  102     26      1-39%   heterogenous               +----------+--------+--------+--------+------------------+--------+ ICA Distal64      14                                         +----------+--------+--------+--------+------------------+--------+ ECA       135     15                                         +----------+--------+--------+--------+------------------+--------+ +----------+--------+-------+--------+-------------------+           PSV cm/sEDV cmsDescribeArm Pressure (mmHG) +----------+--------+-------+--------+-------------------+ [id number]                                         +----------+--------+-------+--------+-------------------+ +---------+--------+--+--------+-+---------+ VertebralPSV cm/s24EDV cm/s5Antegrade +---------+--------+--+--------+-+---------+  Left Carotid Findings: +----------+--------+--------+--------+------------------+--------+           PSV cm/sEDV cm/sStenosisPlaque DescriptionComments +----------+--------+--------+--------+------------------+--------+ CCA Prox  94      18              heterogenous               +----------+--------+--------+--------+------------------+--------+ CCA Distal80      15              heterogenous                +----------+--------+--------+--------+------------------+--------+ ICA Prox  82      24      1-39%   heterogenous               +----------+--------+--------+--------+------------------+--------+ ICA Distal60      17                                         +----------+--------+--------+--------+------------------+--------+ ECA       116     14                                         +----------+--------+--------+--------+------------------+--------+ +----------+--------+--------+--------+-------------------+           PSV cm/sEDV cm/sDescribeArm Pressure (mmHG) +----------+--------+--------+--------+-------------------+ [id number]                                         +----------+--------+--------+--------+-------------------+ +---------+--------+--+--------+-+---------+ VertebralPSV cm/s29EDV cm/s7Antegrade +---------+--------+--+--------+-+---------+   Summary: Right Carotid: Velocities in the right ICA are consistent with a 1-39% stenosis. Left Carotid: Velocities in the left ICA are consistent with a 1-39% stenosis. Vertebrals: Bilateral vertebral arteries demonstrate antegrade flow. *See table(s) above for measurements and observations.  Electronically signed by Deitra Mayo MD on 07/29/2020 at 6:22:54 PM.    Final        [facility] [phone] pager 07/30/2020, 12:08 PM

## 2020-07-30 NOTE — Progress Notes (Signed)
Pt more alert but confused, pulling out PIV, telemetry, mittens (non-restraints), and purwick on a continuous bases.

## 2020-07-31 LAB — CBC
HCT: 33.2 % — ABNORMAL LOW (ref 36.0–46.0)
Hemoglobin: 10.8 g/dL — ABNORMAL LOW (ref 12.0–15.0)
MCH: 27.6 pg (ref 26.0–34.0)
MCHC: 32.5 g/dL (ref 30.0–36.0)
MCV: 84.7 fL (ref 80.0–100.0)
Platelets: 442 10*3/uL — ABNORMAL HIGH (ref 150–400)
RBC: 3.92 MIL/uL (ref 3.87–5.11)
RDW: 14.4 % (ref 11.5–15.5)
WBC: 8.4 10*3/uL (ref 4.0–10.5)
nRBC: 0 % (ref 0.0–0.2)

## 2020-07-31 LAB — BASIC METABOLIC PANEL
Anion gap: 16 — ABNORMAL HIGH (ref 5–15)
BUN: 10 mg/dL (ref 6–20)
CO2: 23 mmol/L (ref 22–32)
Calcium: 8.5 mg/dL — ABNORMAL LOW (ref 8.9–10.3)
Chloride: 101 mmol/L (ref 98–111)
Creatinine, Ser: 1.17 mg/dL — ABNORMAL HIGH (ref 0.44–1.00)
GFR, Estimated: 55 mL/min — ABNORMAL LOW (ref >60)
Glucose, Bld: 138 mg/dL — ABNORMAL HIGH (ref 70–99)
Potassium: 3.4 mmol/L — ABNORMAL LOW (ref 3.5–5.1)
Sodium: 140 mmol/L (ref 135–145)

## 2020-07-31 LAB — GLUCOSE, CAPILLARY
Glucose-Capillary: 111 mg/dL — ABNORMAL HIGH (ref 70–99)
Glucose-Capillary: 112 mg/dL — ABNORMAL HIGH (ref 70–99)
Glucose-Capillary: 126 mg/dL — ABNORMAL HIGH (ref 70–99)
Glucose-Capillary: 134 mg/dL — ABNORMAL HIGH (ref 70–99)
Glucose-Capillary: 150 mg/dL — ABNORMAL HIGH (ref 70–99)
Glucose-Capillary: 155 mg/dL — ABNORMAL HIGH (ref 70–99)

## 2020-07-31 LAB — CSF CULTURE W GRAM STAIN: Culture: NO GROWTH

## 2020-07-31 LAB — MAGNESIUM: Magnesium: 1.7 mg/dL (ref 1.7–2.4)

## 2020-07-31 LAB — PHOSPHORUS: Phosphorus: 3.6 mg/dL (ref 2.5–4.6)

## 2020-07-31 MED ORDER — LEVOTHYROXINE SODIUM 25 MCG PO TABS
25.0000 ug | ORAL_TABLET | Freq: Every day | ORAL | Status: DC
  Administered 2020-07-31 – 2020-08-02 (×3): 25 ug via ORAL
  Filled 2020-07-31 (×3): qty 1

## 2020-07-31 MED ORDER — ASPIRIN 300 MG RE SUPP: 300.0000 mg | Freq: Every day | RECTAL | Status: DC

## 2020-07-31 MED ORDER — ASPIRIN EC 81 MG PO TBEC
81.0000 mg | DELAYED_RELEASE_TABLET | Freq: Every day | ORAL | Status: DC
  Administered 2020-08-01 – 2020-08-02 (×2): 81 mg via ORAL
  Filled 2020-07-31 (×2): qty 1

## 2020-07-31 MED ORDER — ASPIRIN 325 MG PO TABS: 325.0000 mg | ORAL_TABLET | Freq: Every day | ORAL | Status: DC

## 2020-07-31 MED ORDER — PANTOPRAZOLE SODIUM 40 MG PO PACK
40.0000 mg | PACK | Freq: Every day | ORAL | Status: DC
  Administered 2020-08-01: 40 mg via ORAL
  Filled 2020-07-31: qty 20

## 2020-07-31 MED ORDER — POTASSIUM CHLORIDE 10 MEQ/100ML IV SOLN
10.0000 meq | INTRAVENOUS | Status: AC
  Administered 2020-07-31 (×2): 10 meq via INTRAVENOUS
  Filled 2020-07-31 (×2): qty 100

## 2020-07-31 NOTE — Progress Notes (Signed)
Spoke with Dr Myna Hidalgo discussed about pt BP, relayed that based on Dr Leonie Man note, neurologist, BP was [place] if <220/120, reviewed and was recorded

## 2020-07-31 NOTE — Progress Notes (Signed)
Physical Therapy Treatment Patient Details Name: [name] MRN: [id number] DOB: 07-Nov-1965 Today's Date: 07/31/2020    History of Present Illness Pt is a 54 y/o female admitted secondary to AMS. Imaging revealed R thalamic infarct and R white matter infarcts. PMH includes bilateral BKA, DM, dCHF, CKD, CAD s/p CABG.     PT Comments    Pt able to be aroused today though quickly falls back asleep when not stimulated. Pt able to relay PLOF and home environment in more detail today. Worked on abdominal activation in sitting as well as performing amputee exercises and knee stretches to prevent knee flexion contractures. Pt states that she has B prostheses and ambulates with them regularly. Spoke with her son, [name], on the phone and requested that he bring prostheses. PT will continue to follow.    Follow Up Recommendations  SNF;Supervision/Assistance - 24 hour     Equipment Recommendations  Other (comment) (TBD)    Recommendations for Other Services       Precautions / Restrictions Precautions Precautions: Fall Precaution Comments: bilateral BKA  Restrictions Weight Bearing Restrictions: No    Mobility  Bed Mobility Overal bed mobility: Needs Assistance Bed Mobility: Supine to Sit     Supine to sit: Mod assist     General bed mobility comments: pt performed supine to long sit 5x by pulling on therapist's hands for abdominal activation. Worked on maintaining balance in long sitting. Could maintain only 1 min  Transfers                 General transfer comment: though pt more awake, continues to fall asleep when not stimulated so not transferred to chair but spoke to pt's son on phone and requested he bring B prosthetics to progress mobility  Ambulation/Gait             General Gait Details: prosthetics not present   Stairs             Wheelchair Mobility    Modified Rankin (Stroke Patients Only) Modified Rankin (Stroke Patients  Only) Pre-Morbid Rankin Score: Moderately severe disability Modified Rankin: Severe disability     Balance Overall balance assessment: Needs assistance Sitting-balance support: Bilateral upper extremity supported Sitting balance-Leahy Scale: Poor Sitting balance - Comments: min A for sitting, maintaining long sitting x1 min at a time                                    Cognition Arousal/Alertness: Lethargic Behavior During Therapy: Flat affect Overall Cognitive Status: No family/caregiver present to determine baseline cognitive functioning                                 General Comments: pt able to relay home info today when awake      Exercises General Exercises - Lower Extremity Hip ABduction/ADduction: AAROM;Both;10 reps;Supine Straight Leg Raises: AROM;Both;10 reps;Supine Amputee Exercises Quad Sets: AROM;Both;10 reps;Supine Hip ABduction/ADduction: AROM;Both;10 reps;Supine Knee Flexion: AROM;Both;10 reps;Supine Knee Extension: AROM;Both;10 reps;Supine Other Exercises Other Exercises: passive knee ext stretch in supine, 1 min each LE    General Comments        Pertinent Vitals/Pain Pain Assessment: Faces Faces Pain Scale: Hurts a little bit Pain Location: generalized "achy" Pain Descriptors / Indicators: Grimacing Pain Intervention(s): Limited activity within patient's tolerance;Monitored during session    Home Living  Prior Function            PT Goals (current goals can now be found in the care plan section) Acute Rehab PT Goals Patient Stated Goal: none stated PT Goal Formulation: With patient Time For Goal Achievement: 08/12/20 Potential to Achieve Goals: Fair Progress towards PT goals: Progressing toward goals    Frequency    Min 3X/week      PT Plan Current plan remains appropriate    Co-evaluation              AM-PAC PT "6 Clicks" Mobility   Outcome Measure  Help needed  turning from your back to your side while in a flat bed without using bedrails?: A Lot Help needed moving from lying on your back to sitting on the side of a flat bed without using bedrails?: A Lot Help needed moving to and from a bed to a chair (including a wheelchair)?: Total Help needed standing up from a chair using your arms (e.g., wheelchair or bedside chair)?: Total Help needed to walk in hospital room?: Total Help needed climbing 3-5 steps with a railing? : Total 6 Click Score: 8    End of Session   Activity Tolerance: Patient limited by lethargy Patient left: in bed;with call bell/phone within reach;with bed alarm set Nurse Communication: Mobility status PT Visit Diagnosis: Difficulty in walking, not elsewhere classified (R26.2);Other abnormalities of gait and mobility (R26.89)     Time: 5956-3875 PT Time Calculation (min) (ACUTE ONLY): 25 min  Charges:  $Therapeutic Exercise: 23-37 mins                     [place]  Pager [phone] Office [place] 07/31/2020, 4:57 PM

## 2020-07-31 NOTE — Progress Notes (Signed)
Wheelchair:  Patient suffers from [place] which impairs their ability to perform daily activities like ambulating in the home. A walker will not resolve  issue with performing activities of daily living. A wheelchair will allow patient to safely perform daily activities. Patient is not able to propel themselves in the home using a standard weight wheelchair due to weakness. Patient can self propel in the lightweight wheelchair. Length of need lifetime.  Accessories: elevating leg rests (ELRs), wheel locks, extensions and anti-tippers.

## 2020-07-31 NOTE — Progress Notes (Signed)
    Called for possible TEE for the patient. She is lethargic and unable to appropriately consent for procedure. I called and talked with her son Faythe Ghee who expressed he was like to avoid any unnecessary procedures given her current status. He asked that he be advised prior to any procedures going forward as he is her care giver. I advised we would remove her from the schedule. Requesting team was informed that patient was canceled.   SignedReino Bellis, NP-C 07/31/2020, 3:59 PM Pager: [phone]

## 2020-07-31 NOTE — Progress Notes (Signed)
  Speech Language Pathology Treatment: Dysphagia  Patient Details Name: [name] MRN: [id number] DOB: [birth year] Today's Date: 07/31/2020 Time: 1420-1440 SLP Time Calculation (min) (ACUTE ONLY): 20 min  Assessment / Plan / Recommendation Clinical Impression  Pt was seen for dysphagia treatment. She was more and alert and cooperative compared to yesterday's evaluation. She tolerated POs of puree and thin liquid without any s/sx of aspiration. Recommend she continue with dys 1 diet/thin liquids. At this time, no further treatment is warranted and SLP will sign off.   HPI HPI: 54 year old past history of diabetes, peripheral vascular disease status post bilateral BKA's, prior ICH with some left-sided residual stiffness and no gross weakness but some residual dysarthria, hypertension, hyperlipidemia presented for evaluation of altered mental status. CT scan concerning for new strokes compared to prior scan of 2017. Follow-up MRI revealed ischemic infarct in R ventral and dorsal thalamus, late subacute to chronic ischemic infarcts in R parieto-occipital region, R corona radiata, and L corpus callosum/corona radiata.       SLP Plan  All goals met;Discharge SLP treatment due to (comment)       Recommendations  Diet recommendations: Dysphagia 1 (puree);Thin liquid Liquids provided via: Straw Medication Administration: Crushed with puree Supervision: Staff to assist with self feeding;Full supervision/cueing for compensatory strategies Compensations: Slow rate;Small sips/bites Postural Changes and/or Swallow Maneuvers: Seated upright 90 degrees                Oral Care Recommendations: Oral care BID;Staff/trained caregiver to provide oral care SLP Visit Diagnosis: Dysphagia, unspecified (R13.10) Plan: All goals met;Discharge SLP treatment due to (comment)       GO                Greggory Keen 07/31/2020, 2:55 PM

## 2020-07-31 NOTE — Progress Notes (Signed)
PROGRESS NOTE    [name]  CWC:[id number] DOB: March 29, 1966 DOA: 07/28/2020 PCP: Mosie Lukes, MD    Brief Narrative:  This 54 years old female with medical history significant for bilateral below-knee amputations, type 2 diabetes, CKD stage IIIa, hypothyroidism, chronic diastolic CHF, Coronary artery disease,  status post CABG, polyneuropathy who presents via EMS from home with altered mental status with concern for accidental Percocet overdose.  Patient received Narcan with minimal improvement.  History is obtained from ED physician and from review of medical records.  CT head noncontrast on arrival shows subacute infarct. Neurology was consulted,  recommended stroke work-up.  MRI and MRA head. MRI: Acute ischemic nonhemorrhagic infarct involving the ventral right thalamus. She has leukocytosis without a clear source of infection and given the concomitant presence of strokes, a CNS infection as well as infective endocarditis should be ruled out.  Echocardiogram completed ,  No vegetations.  started on vancomycin, ceftriaxone and acyclovir. Patient continued to remain stuporous lethargic, neurology reassessed the patient,  thinks patient might be having seizures, repeat CT head unchanged. Patient has stat EEG  : no evidence of seizures , started on Keppra.  Infectious disease recommended,  discontinue antibiotics.  Neurology thinks encephalopathy could be due to Keppra and ceftriaxone.  Patient improved after Keppra and antibiotics discontinued.   Assessment & Plan:   Active Problems:   AMS (altered mental status)   Cerebral thrombosis with cerebral infarction   Acute CVA (cerebrovascular accident) ([place])   Leukocytosis   Acute metabolic encephalopathy could be multifactorial:>>> Improved. Differential could be secondary to possible CVA, opiate overuse. Patient received Narcan with minimal improvement.  UA : unremarkable, procalcitonin 0.10 CXR, no consolidative opacities.    Possible right occipital lobe subacute infarct on CT scan MRI brain and MRA head : Acute ischemic nonhemorrhagic infarct involving the ventral right thalamus. She has leukocytosis without a clear source of infection and given the concomitant presence of strokes, a CNS infection as well as infective endocarditis should be ruled out.   Echocardiogram completed ,  No vegetations. Patient underwent spinal tap, CSF clear, no WBCs, cultures pending. Infectious disease consulted,  recommended to discontinue antibiotics. Continue acyclovir until HSV result comes back. Patient improved after Keppra and ceftriaxone disontinued.  Acute right thalamic ischemic stroke: Stroke pathway MRI brain and MRA head : Acute ischemic nonhemorrhagic infarct involving the ventral right thalamus. 2D echocardiogram : LVEF 55 to 60%. No embolic source. Neurochecks every 4 hours. Speech/PT/OT evaluation Fall and aspiration precautions. Neurology thought patient might be having a seizures.  Started on [place] EEG :  No evidence of seizures. Recommended aspirin and Plavix for 3 months.   Essential hypertension Blood pressure has improved. Resume home blood pressure medications when awake. IV Lopressor as needed.  Hypothyroidism TSH normal IV levothyroxine when n.p.o.  GERD IV Protonix 40 mg daily Change to p.o. Protonix when able to take oral.  Bilateral below the knee amputation No significant changes. Monitor for signs of infection.   DVT prophylaxis:  Lovenox Code Status: Full Family Communication: Spoke with son at bedside. Disposition Plan:   Status is: Inpatient  Remains inpatient appropriate because:Inpatient level of care appropriate due to severity of illness   Dispo:  Patient From: Home  Planned Disposition: [place] vs SNF  Expected discharge date:   >3 DAYS  Medically stable for discharge: No   Consultants:    Neurology   Procedures: Antimicrobials:  Anti-infectives (From admission, onward)   Start     Dose/Rate Route Frequency Ordered Stop   07/29/20 2000  vancomycin (VANCOREADY) IVPB 750 mg/150 mL  Status:  Discontinued        750 mg 150 mL/hr over 60 Minutes Intravenous Every 24 hours 07/29/20 0808 07/29/20 1515   07/28/20 2200  vancomycin (VANCOREADY) IVPB 500 mg/100 mL  Status:  Discontinued        500 mg 100 mL/hr over 60 Minutes Intravenous Every 12 hours 07/28/20 0644 07/29/20 0808   07/28/20 2000  cefTRIAXone (ROCEPHIN) 2 g in sodium chloride 0.9 % 100 mL IVPB  Status:  Discontinued        2 g 200 mL/hr over 30 Minutes Intravenous Every 12 hours 07/28/20 0644 07/29/20 1515   07/28/20 2000  acyclovir (ZOVIRAX) 600 mg in dextrose 5 % 100 mL IVPB        600 mg 112 mL/hr over 60 Minutes Intravenous Every 12 hours 07/28/20 0644     07/28/20 1400  ampicillin (OMNIPEN) 2 g in sodium chloride 0.9 % 100 mL IVPB  Status:  Discontinued        2 g 300 mL/hr over 20 Minutes Intravenous Every 6 hours 07/28/20 0644 07/29/20 1515   07/28/20 0700  vancomycin (VANCOREADY) IVPB 1500 mg/300 mL        1,500 mg 150 mL/hr over 120 Minutes Intravenous  Once 07/28/20 0621 07/28/20 1051   07/28/20 0700  acyclovir (ZOVIRAX) 600 mg in dextrose 5 % 100 mL IVPB        600 mg 112 mL/hr over 60 Minutes Intravenous  Once 07/28/20 0621 07/28/20 0913   07/28/20 0630  cefTRIAXone (ROCEPHIN) 2 g in sodium chloride 0.9 % 100 mL IVPB        2 g 200 mL/hr over 30 Minutes Intravenous  Once 07/28/20 0621 07/28/20 0804   07/28/20 0630  ampicillin (OMNIPEN) 2 g in sodium chloride 0.9 % 100 mL IVPB        2 g 300 mL/hr over 20 Minutes Intravenous  Once 07/28/20 3009 07/28/20 0842     Subjective: Patient was seen and examined at bedside.  Overnight events noted.   Patient is awake,  Alert x 3 ,  following commands.  Patient is not in any distress. Objective: Vitals:   07/31/20 0013 07/31/20 0323 07/31/20 0811 07/31/20  1156  BP: (!) 197/93 (!) 191/97 (!) 190/179 (!) 151/83  Pulse: 98 86 80 83  Resp: 18 12 20 16   Temp: 98.5 F (36.9 C) 98.9 F (37.2 C) 98.2 F (36.8 C) (!) 97.5 F (36.4 C)  TempSrc: Oral Oral Oral Oral  SpO2: 100% 99% 100% 100%  Weight:      Height:        Intake/Output Summary (Last 24 hours) at 07/31/2020 1424 Last data filed at 07/31/2020 2330 Gross per 24 hour  Intake 676.23 ml  Output 700 ml  Net -23.77 ml   Filed Weights   07/28/20 0037  Weight: 60 kg    Examination:  General exam: Appears calm and comfortable, not in distress Respiratory system: Clear to auscultation. Respiratory effort normal. Cardiovascular system: S1 & S2 heard, RRR. No JVD, murmurs, rubs, gallops or clicks. No pedal edema. Gastrointestinal system: Abdomen is nondistended, soft and nontender. No organomegaly or masses felt. Normal bowel sounds heard. Central nervous system: Alert and oriented x 3,  No focal neurological deficits. Extremities: Bilateral below-knee amputation Skin: No rashes, lesions or ulcers Psychiatry: Judgement and insight appear normal. Mood & affect appropriate.     Data Reviewed: I have personally reviewed following labs and imaging studies  CBC: Recent Labs  Lab 07/28/20 0150 07/28/20 0527 07/29/20 0152 07/30/20 0212 07/31/20 0408  WBC 14.5* 12.0* 9.9 8.6 8.4  NEUTROABS 12.8* 10.2* 6.6 5.5  --   HGB 11.4* 10.9* 11.1* 10.9* 10.8*  HCT 35.8* 34.2* 35.1* 33.8* 33.2*  MCV 85.2 86.8 87.1 84.3 84.7  PLT 506* 476* 490* 481* 681*   Basic Metabolic Panel: Recent Labs  Lab 07/28/20 0150 07/28/20 0527 07/29/20 0152 07/30/20 0212 07/31/20 0408  NA 140 139 140 140 140  K 4.0 4.3 4.4 4.0 3.4*  CL 100 103 105 105 101  CO2 29 27 26 25 23   GLUCOSE 113* 228* 128* 130* 138*  BUN 16 16 14 12 10   CREATININE 1.17* 1.15* 1.31* 1.33* 1.17*  CALCIUM 9.3 8.8* 8.7* 8.7* 8.5*  MG  --   --  1.7 1.7 1.7  PHOS  --   --  4.0 3.7 3.6   GFR: Estimated Creatinine Clearance:  42.5 mL/min (A) (by C-G formula based on SCr of 1.17 mg/dL (H)). Liver Function Tests: Recent Labs  Lab 07/28/20 0150 07/29/20 0152 07/30/20 0212  AST 17 19 31   ALT 17 17 15   ALKPHOS 91 69 76  BILITOT 0.4 0.2* 0.9  PROT 7.2 6.0* 6.2*  ALBUMIN 2.6* 2.1* 2.2*   No results for input(s): LIPASE, AMYLASE in the last 168 hours. Recent Labs  Lab 07/28/20 0150  AMMONIA 18   Coagulation Profile: No results for input(s): INR, PROTIME in the last 168 hours. Cardiac Enzymes: No results for input(s): CKTOTAL, CKMB, CKMBINDEX, TROPONINI in the last 168 hours. BNP (last 3 results) No results for input(s): PROBNP in the last 8760 hours. HbA1C: No results for input(s): HGBA1C in the last 72 hours. CBG: Recent Labs  Lab 07/30/20 2104 07/31/20 0011 07/31/20 0320 07/31/20 0806 07/31/20 1204  GLUCAP 112* 112* 126* 134* 155*   Lipid Profile: No results for input(s): CHOL, HDL, LDLCALC, TRIG, CHOLHDL, LDLDIRECT in the last 72 hours. Thyroid Function Tests: No results for input(s): TSH, T4TOTAL, FREET4, T3FREE, THYROIDAB in the last 72 hours. Anemia Panel: No results for input(s): VITAMINB12, FOLATE, FERRITIN, TIBC, IRON, RETICCTPCT in the last 72 hours. Sepsis Labs: Recent Labs  Lab 07/28/20 0527  PROCALCITON <0.10    Recent Results (from the past 240 hour(s))  Respiratory Panel by RT PCR (Flu A&B, Covid) - Nasopharyngeal Swab     Status: None   Collection Time: 07/28/20  3:19 AM   Specimen: Nasopharyngeal Swab; Nasopharyngeal(NP) swabs in vial transport medium  Result Value Ref Range Status   SARS Coronavirus 2 by RT PCR NEGATIVE NEGATIVE Final    Comment: (NOTE) SARS-CoV-2 target nucleic acids are NOT DETECTED.  The SARS-CoV-2 RNA is generally detectable in upper respiratoy specimens during the acute phase of infection. The lowest concentration of SARS-CoV-2 viral copies this assay can detect is 131 copies/mL. A negative result does not preclude SARS-Cov-2 infection and  should not be used as the sole basis for treatment or other patient management decisions. A negative result may occur with  improper specimen collection/handling, submission of specimen other than nasopharyngeal swab, presence of viral mutation(s) within the areas targeted by this assay, and inadequate number of viral copies (<131 copies/mL). A negative result must be combined with clinical observations, patient  history, and epidemiological information. The expected result is Negative.  Fact Sheet for Patients:  PinkCheek.be  Fact Sheet for Healthcare Providers:  GravelBags.it  This test is no t yet approved or cleared by the [place] FDA and  has been authorized for detection and/or diagnosis of SARS-CoV-2 by FDA under an Emergency Use Authorization (EUA). This EUA will remain  in effect (meaning this test can be used) for the duration of the COVID-19 declaration under Section 564(b)(1) of the Act, 21 U.S.C. section 360bbb-3(b)(1), unless the authorization is terminated or revoked sooner.     Influenza A by PCR NEGATIVE NEGATIVE Final   Influenza B by PCR NEGATIVE NEGATIVE Final    Comment: (NOTE) The Xpert Xpress SARS-CoV-2/FLU/RSV assay is intended as an aid in  the diagnosis of influenza from Nasopharyngeal swab specimens and  should not be used as a sole basis for treatment. Nasal washings and  aspirates are unacceptable for Xpert Xpress SARS-CoV-2/FLU/RSV  testing.  Fact Sheet for Patients: PinkCheek.be  Fact Sheet for Healthcare Providers: GravelBags.it  This test is not yet approved or cleared by the [place] FDA and  has been authorized for detection and/or diagnosis of SARS-CoV-2 by  FDA under an Emergency Use Authorization (EUA). This EUA will remain  in effect (meaning this test can be used) for the duration of the  Covid-19 declaration  under Section 564(b)(1) of the Act, 21  U.S.C. section 360bbb-3(b)(1), unless the authorization is  terminated or revoked. Performed at [facility], [place] [address] [place], [place] [address]   CSF culture     Status: None   Collection Time: 07/28/20  6:06 AM   Specimen: CSF; Cerebrospinal Fluid  Result Value Ref Range Status   Specimen Description CSF  Final   Special Requests NONE  Final   Gram Stain   Final    WBC PRESENT, PREDOMINANTLY MONONUCLEAR NO ORGANISMS SEEN CYTOSPIN SMEAR    Culture   Final    NO GROWTH 3 DAYS Performed at [facility], [place] [address] [place], [place] [address]    Report Status 07/31/2020 FINAL  Final  Blood culture (routine x 2)     Status: None (Preliminary result)   Collection Time: 07/28/20  6:55 AM   Specimen: BLOOD RIGHT ARM  Result Value Ref Range Status   Specimen Description BLOOD RIGHT ARM  Final   Special Requests   Final    BOTTLES DRAWN AEROBIC AND ANAEROBIC Blood Culture adequate volume   Culture   Final    NO GROWTH 3 DAYS Performed at [facility], [place] [address] [place], [place] [address]    Report Status PENDING  Incomplete  Blood culture (routine x 2)     Status: None (Preliminary result)   Collection Time: 07/28/20  7:02 AM   Specimen: BLOOD RIGHT HAND  Result Value Ref Range Status   Specimen Description BLOOD RIGHT HAND  Final   Special Requests   Final    BOTTLES DRAWN AEROBIC AND ANAEROBIC Blood Culture results may not be optimal due to an inadequate volume of blood received in culture bottles   Culture   Final    NO GROWTH 3 DAYS Performed at [facility], [place] [address], [place], [place] [address]    Report Status PENDING  Incomplete         Radiology Studies: No results found. Scheduled Meds: . [START ON 08/01/2020] aspirin  300 mg Rectal Daily   Or  . [  START ON 08/01/2020] aspirin  325 mg Oral Daily  . clopidogrel  75 mg Oral Q breakfast  . enoxaparin (LOVENOX) injection  40  mg Subcutaneous Daily  . insulin aspart  0-9 Units Subcutaneous Q4H  . levothyroxine  25 mcg Oral Q0600  . metoprolol tartrate  2.5 mg Intravenous Q6H  . [START ON 08/01/2020] pantoprazole sodium  40 mg Oral Daily   Continuous Infusions: . sodium chloride 50 mL/hr at 07/31/20 0736  . acyclovir 600 mg (07/31/20 0844)     LOS: 3 days    Time spent: 25 mins    Najeeb Uptain, MD [facility]   If 7PM-7AM, please contact night-coverage

## 2020-07-31 NOTE — Progress Notes (Signed)
[facility]  Date of Admission:  07/28/2020     Total days of antibiotics: 4         Current antibiotics:  Day 4 acyclovir  Reason for visit: Follow up on possible encephalitis   ASSESSMENT AND PLAN:   [name] is a 54 y.o. female with a complicated PMHx who presented 11/20 with encephalopathy and acute ischemic infarcts.    There was concern for infection due to leukocytosis in the setting of her encephalopathy but this has now normalized.  LP unremarkable without white cells.  Neurology with concerns for status epilepticus but no evidence of meningitis or infectious etiology.  Broad spectrum antibiotics were stopped and she is currently on acyclovir pending HSV CSF studies.  Unfortunately, PCR not sent and only antibodies from CSF were sent.   # Encephalopathy # Leukocytosis, now normalized  --suspicion for infection remains low.  Agree with holding further abx given her bland CSF.   --HSV antibody studies pending.  Unfortunately HSV PCR not done, but antibodies sent instead.  I contacted micro lab who had enough CSF to add on HSV PCR.  This result is now in process.  On acyclovir pending this result.   --continue acyclovir until PCR negative  Other issues: # CVA # Suspected seizures # HTN, HLD, DM     SUBJECTIVE:   No acute complaints.  Denies fevers, pain, cough, SOB.  Review of Systems: As noted above.  All other systems reviewed and are negative.   OBJECTIVE:   Allergies  Allergen Reactions  . Lyrica [Pregabalin] Other (See Comments)    MADE PATIENT VERY EMOTIONAL AND WOULD CRY EASILY  . Morphine And Related Nausea And Vomiting  . Penicillins Other (See Comments)    GI upset related to Augmentin use  Did it involve swelling of the face/tongue/throat, SOB, or low BP? No Did it involve sudden or severe rash/hives, skin peeling, or any reaction on the inside of your mouth or nose? No Did you need to seek medical  attention at a hospital or doctor's office? No When did it last happen?April 2020 If all above answers are "NO", may proceed with cephalosporin use.   . Tramadol Nausea And Vomiting    Pt cant tolerate this pain med.     Blood pressure (!) 190/179, pulse 80, temperature 98.2 F (36.8 C), temperature source Oral, resp. rate 20, height 5\' 2"  (1.575 m), weight 60 kg, last menstrual period 02/12/2011, SpO2 100 %. Body mass index is 24.19 kg/m.  Physical Exam Constitutional:      Comments: Chronically ill appearing woman lying in bed, NAD.   HENT:     Head: Normocephalic and atraumatic.  Cardiovascular:     Rate and Rhythm: Normal rate and regular rhythm.  Pulmonary:     Effort: Pulmonary effort is normal. No respiratory distress.     Breath sounds: Normal breath sounds.  Skin:    General: Skin is warm and dry.  Neurological:     General: No focal deficit present.     Cranial Nerves: No cranial nerve deficit.     Comments: She is able to follow simple commands.  She is lethargic but more awake than yesterday.       Lab Results & Microbiology Lab Results  Component Value Date   WBC 8.4 07/31/2020   HGB 10.8 (L) 07/31/2020   HCT 33.2 (L) 07/31/2020   MCV 84.7 07/31/2020   PLT 442 (H) 07/31/2020  Lab Results  Component Value Date   NA 140 07/31/2020   K 3.4 (L) 07/31/2020   CO2 23 07/31/2020   GLUCOSE 138 (H) 07/31/2020   BUN 10 07/31/2020   CREATININE 1.17 (H) 07/31/2020   CALCIUM 8.5 (L) 07/31/2020   GFRNONAA 55 (L) 07/31/2020   GFRAA 40 (L) 10/11/2019    Lab Results  Component Value Date   ALT 15 07/30/2020   AST 31 07/30/2020   ALKPHOS 76 07/30/2020   BILITOT 0.9 07/30/2020     I have reviewed the micro and lab results in Epic.   Raynelle Highland for [facility] [phone] pager 07/31/2020, 10:37 AM

## 2020-07-31 NOTE — TOC Initial Note (Signed)
Transition of Care [facility]) - Initial/Assessment Note    Patient Details  Name: [name] MRN: [id number] Date of Birth: Mar 12, 1966  Transition of Care [facility]) CM/SW Contact:    Pollie Friar, RN Phone Number: 07/31/2020, 2:07 PM  Clinical Narrative:                 Recommendations are for SNF. CM met with the patient and then with permission reached out to her son. Pts son doesn't want SNF rehab d/t pt will have to be under quarantine at the facility since pt has not had any of the covid vaccines.  Son states they have an aide that assists at home and asked CM to speak with her. CM called pts aide: [name] and she states she is only there in the afternoons. She is going to talk with the patients son and update CM.  TOC following.  Expected Discharge Plan: [place] Barriers to Discharge: Continued Medical Work up   Patient Goals and CMS Choice   CMS Medicare.gov Compare Post Acute Care list provided to:: Patient Represenative (must comment) Choice offered to / list presented to : Adult Children  Expected Discharge Plan and Services Expected Discharge Plan: [place]   Discharge Planning Services: CM Consult Post Acute Care Choice: Home Health, Durable Medical Equipment Living arrangements for the past 2 months: Single Family Home                 DME Arranged: Wheelchair manual DME Agency: [employer] Date DME Agency Contacted: 07/31/20   Representative spoke with at DME Agency: Freda Munro HH Arranged: PT, OT          Prior Living Arrangements/Services Living arrangements for the past 2 months: [place] Lives with:: Adult Children Patient language and need for interpreter reviewed:: Yes Do you feel safe going back to the place where you live?: Yes      Need for Family Participation in Patient Care: Yes (Comment)   Current home services: DME (hospital bed/ 3 in 1/ shower seat---aide) Criminal Activity/Legal Involvement  Pertinent to Current Situation/Hospitalization: No - Comment as needed  Activities of Daily Living      Permission Sought/Granted                  Emotional Assessment Appearance:: Appears stated age Attitude/Demeanor/Rapport: Engaged Affect (typically observed): Accepting Orientation: : Oriented to Place, Oriented to Situation, Oriented to Self   Psych Involvement: No (comment)  Admission diagnosis:  Somnolence [R40.0] Encephalopathy [G93.40] Acute CVA (cerebrovascular accident) ([place]) [I63.9] AMS (altered mental status) [R41.82] Patient Active Problem List   Diagnosis Date Noted  . Acute CVA (cerebrovascular accident) ([place])   . Leukocytosis   . AMS (altered mental status) 07/28/2020  . Cerebral thrombosis with cerebral infarction 07/28/2020  . Candidal skin infection 04/01/2020  . Polyradiculoneuropathy ([place]) 01/23/2020  . Sun-damaged skin 01/23/2020  . Uncontrolled type 2 diabetes mellitus with hyperglycemia ([place]) 11/25/2019  . Wound infection after surgery 11/25/2019  . Adjustment disorder with mixed anxiety and depressed mood   . S/P bilateral BKA (below knee amputation) ([place]) 09/27/2019  . Acute osteomyelitis of left foot ([place]) 09/21/2019  . Subacute osteomyelitis, left ankle and foot ([place])   . Hx of right BKA ([place]) 02/09/2019  . Diabetes mellitus type 2 in nonobese (HCC)   . Labile blood pressure   . Labile blood glucose   . Phantom limb pain ([place])   . CKD (chronic kidney disease),  stage III ([place])   . Hypothyroidism   . Chronic diastolic congestive heart failure ([place])   . Coronary artery disease involving coronary bypass graft of native heart without angina pectoris   . Poorly controlled type 2 diabetes mellitus with peripheral neuropathy ([place])   . Acute on chronic anemia   . History of below knee amputation, right ([place]) 12/31/2018  . S/P CABG x 3 06/28/2018  . Coronary artery disease involving native coronary artery of native heart with angina pectoris  ([place]) 06/25/2018  . Type 2 diabetes mellitus with vascular disease ([place]) 06/24/2018  . Acute on chronic diastolic CHF (congestive heart failure) ([place]) 06/24/2018  . Non-ST elevation (NSTEMI) myocardial infarction ([place]) 06/24/2018  . Chest pain 06/23/2018  . Glaucoma 09/17/2017  . Diabetic retinopathy ([place]) 09/17/2017  . Otitis media 11/10/2016  . Decreased visual acuity 11/10/2016  . Spinal stenosis, lumbar region with neurogenic claudication 10/22/2016  . Other spondylosis with radiculopathy, lumbar region 10/13/2016  . Lipoma of abdominal wall 10/05/2016  . Severe protein-calorie malnutrition ([place]) 09/04/2016  . Anemia of chronic disease 09/04/2016  . Diabetic polyneuropathy associated with type 2 diabetes mellitus ([place]) 05/07/2016  . Orthostatic hypotension 05/07/2016  . Cerebrovascular disease 04/27/2016  . N&V (nausea and vomiting) 04/01/2016  . ICH (intracerebral hemorrhage) ([place]) 03/30/2016  . Visit for preventive health examination 05/21/2015  . Breast cancer screening 05/21/2015  . Arm lesion 07/02/2014  . Pain in limb 01/13/2014  . Neuropathic pain of both legs 01/13/2014  . Hip pain 01/12/2014  . Allergic rhinitis 03/10/2013  . Back pain 03/08/2013  . Depression 12/18/2012  . PVD (peripheral vascular disease) ([place]) 01/21/2012  . Hyperlipidemia 12/06/2010  . Overweight(278.02) 12/06/2010  . RESTLESS LEG SYNDROME 10/25/2010  . Migraine without aura 10/07/2010  . Hereditary and idiopathic peripheral neuropathy 10/07/2010  . Essential hypertension, benign 10/07/2010  . DISTURBANCE OF SKIN SENSATION 10/07/2010  . HEART MURMUR, HX OF 10/07/2010  . NEPHROLITHIASIS, HX OF 10/07/2010   PCP:  Mosie Lukes, MD Pharmacy:   [id number] [place], [place] - [address] [place] [id number]: [phone]: [phone] [id number] [id number]: [phone]: [phone]   Social Determinants of Health (SDOH) Interventions    Readmission Risk Interventions Readmission Risk Prevention Plan 12/14/2018 07/06/2018  Transportation Screening Complete Complete  PCP or Specialist Appt within 5-7 Days - Complete  PCP or Specialist Appt within 3-5 Days Complete -  Home Care Screening - Complete  Medication Review (RN CM) - Complete  HRI or Home Care Consult Complete -  Social Work Consult for Recovery Care Planning/Counseling Complete -  Palliative Care Screening Not Applicable -  Some recent data might be hidden

## 2020-07-31 NOTE — Progress Notes (Signed)
STROKE TEAM PROGRESS NOTE      INTERVAL HISTORY Patient mental status appears much improved.  She is awake alert and more interactive today.  She is able to carry out a conversation but does have diminished attention and easy distractibility and poor insight into illness.  Able to move all 4 extremities well against gravity.  Blood pressure is mildly elevated but otherwise vital signs are stable.  CSF herpes PCR is pending  OBJECTIVE Vitals:   07/31/20 0013 07/31/20 0323 07/31/20 0811 07/31/20 1156  BP: (!) 197/93 (!) 191/97 (!) 190/179 (!) 151/83  Pulse: 98 86 80 83  Resp: 18 12 20 16   Temp: 98.5 F (36.9 C) 98.9 F (37.2 C) 98.2 F (36.8 C) (!) 97.5 F (36.4 C)  TempSrc: Oral Oral Oral Oral  SpO2: 100% 99% 100% 100%  Weight:      Height:        CBC:  Recent Labs  Lab 07/29/20 0152 07/29/20 0152 07/30/20 0212 07/31/20 0408  WBC 9.9   < > 8.6 8.4  NEUTROABS 6.6  --  5.5  --   HGB 11.1*   < > 10.9* 10.8*  HCT 35.1*   < > 33.8* 33.2*  MCV 87.1   < > 84.3 84.7  PLT 490*   < > 481* 442*   < > = values in this interval not displayed.    Basic Metabolic Panel:  Recent Labs  Lab 07/30/20 0212 07/31/20 0408  NA 140 140  K 4.0 3.4*  CL 105 101  CO2 25 23  GLUCOSE 130* 138*  BUN 12 10  CREATININE 1.33* 1.17*  CALCIUM 8.7* 8.5*  MG 1.7 1.7  PHOS 3.7 3.6    Lipid Panel:     Component Value Date/Time   CHOL 271 (H) 07/28/2020 0527   TRIG 118 07/28/2020 0527   HDL 48 07/28/2020 0527   CHOLHDL 5.6 07/28/2020 0527   VLDL 24 07/28/2020 0527   LDLCALC 199 (H) 07/28/2020 0527   HgbA1c:  Lab Results  Component Value Date   HGBA1C 10.5 (H) 07/28/2020   Urine Drug Screen:     Component Value Date/Time   LABOPIA NONE DETECTED 07/28/2020 0300   COCAINSCRNUR NONE DETECTED 07/28/2020 0300   LABBENZ NONE DETECTED 07/28/2020 0300   AMPHETMU NONE DETECTED 07/28/2020 0300   THCU NONE DETECTED 07/28/2020 0300   LABBARB NONE DETECTED 07/28/2020 0300    Alcohol Level      Component Value Date/Time   ETH <10 07/28/2020 0150    IMAGING  CT Head Wo Contrast 07/28/2020 IMPRESSION:  1. Geographic cortically based hypoattenuation situated right occipital lobe is new from comparison imaging and may reflect a subacute infarct.  2. New hypoattenuating foci in the right thalamus and basal ganglia could reflect sequela of age-indeterminate lacunar type infarction new from 2017. A similar focus in the left insula is unchanged from comparison.  3. Chronic microvascular angiopathy and parenchymal volume loss.   MR ANGIO HEAD WO CONTRAST  07/28/2020 IMPRESSION:   MRI HEAD  IMPRESSION:  1. 7 mm acute ischemic nonhemorrhagic infarct involving the ventral right thalamus.  2. Additional 11 mm acute to early subacute ischemic nonhemorrhagic right periatrial white matter infarcts, with extension to involve the adjacent dorsal right thalamus.  3. Late subacute to chronic ischemic infarcts involving the right parieto-occipital region, right corona radiata, and anterior left corpus callosum/corona radiata.  4. Underlying moderate chronic small vessel ischemic disease with multiple remote lacunar infarcts as above.  MRA HEAD  IMPRESSION:  1. Negative intracranial MRA for large vessel occlusion.  2. Focal severe proximal right P2 stenosis, likely accounting for the extensive right PCA distribution ischemic changes.  3. Additional severe stenosis at the right MCA bifurcation to involve the adjacent proximal M2 segments.  4. Moderate distal small vessel atheromatous irregularity throughout the intracranial circulation.   DG Chest Portable 1 View 07/28/2020 IMPRESSION:  1. Low lung volumes, atelectasis  2. Mild vascular congestion without features of frank edema.  3. Prior sternotomy and CABG.   Transthoracic Echocardiogram  07/29/2020 IMPRESSIONS  1. Left ventricular ejection fraction, by estimation, is 55 to 60%. The  left ventricle has normal function. The  left ventricle has no regional  wall motion abnormalities. Left ventricular diastolic parameters are  consistent with Grade II diastolic  dysfunction (pseudonormalization). Elevated left ventricular end-diastolic  pressure.  2. Right ventricular systolic function is normal. The right ventricular  size is normal.  3. The mitral valve is normal in structure. Trivial mitral valve  regurgitation. No evidence of mitral stenosis.  4. The aortic valve is tricuspid. Aortic valve regurgitation is not  visualized. Mild aortic valve sclerosis is present, with no evidence of  aortic valve stenosis.  5. The inferior vena cava is normal in size with greater than 50%  respiratory variability, suggesting right atrial pressure of 3 mmHg.  6. Agitated saline contrast bubble study was negative, with no evidence  of any interatrial shunt.   Bilateral Carotid Dopplers  11/21//2021 Summary:  Right Carotid: Velocities in the right ICA are consistent with a 1-39% stenosis.  Left Carotid: Velocities in the left ICA are consistent with a 1-39% stenosis.  Vertebrals: Bilateral vertebral arteries demonstrate antegrade flow.   ECG - SR rate 95 BPM. (See cardiology reading for complete details)  EEG 07/28/2020 ABNORMALITY -Continued slow, generalized -Intermittent rhythmic delta slow, generalized IMPRESSION: This study is suggestive of moderate diffuse encephalopathy, nonspecific etiology. No seizures or epileptiform discharges were seen throughout the recording.  PHYSICAL EXAM Blood pressure (!) 151/83, pulse 83, temperature (!) 97.5 F (36.4 C), temperature source Oral, resp. rate 16, height 5\' 2"  (1.575 m), weight 60 kg, last menstrual period 02/12/2011, SpO2 100 %.  GENERAL: Middle-aged Caucasian lady not in distress. HEENT: Edentulous  ABDOMEN: soft  EXTREMITIES: No edema; bilateral leg amputation  BACK: Normal  SKIN: Normal by inspection.    MENTAL STATUS:   CRANIAL NERVES: Pupils are  equal, round and reactive to light but clearly she has poor vision and only sees shadows on exam testing at the bedside; extra ocular movements are full, there is no significant nystagmus; upper and lower facial muscles are normal in strength and symmetric, there is no flattening of the nasolabial folds; tongue is midline; uvula is midline.  MOTOR: Normal tone, bulk and strength in the upper extremities; no pronator drift.  COORDINATION: Left finger to nose is normal, right finger to nose is normal, No rest tremor; no intention tremor; no postural tremor; no bradykinesia.  SENSATION: Withdraws all 4 extremities to painful stimuli equally  Gait not tested       ASSESSMENT/PLAN [name] is a 54 y.o. female with history of diabetes, peripheral vascular disease status post bilateral BKA's, prior ICH with some residual left sided stiffness but no gross weakness, CAD, hypertension, hyperlipidemia, and some dysarthria at baseline presented to the emergency room for altered mental status, poorly responsive, concern for possible opioid use, leukocytosis, and head CT revealing possible stroke. She did  not receive IV t-PA due to late presentation (>4.5 hours from time of onset).   Stroke: acute ischemic nonhemorrhagic infarct involving the ventral right thalamus. Additional 11 mm acute to early subacute ischemic nonhemorrhagic right periatrial white matter infarcts, with extension to involve the adjacent dorsal right thalamus.  Etiology likely small vessel disease but mental status appears to be out of proportion to the strokes and suspect overlying encephalopathy.  Possibly medication effect of ceftriaxone and Keppra  Resultant altered mental status.  Code Stroke CT Head - not ordered   CT head - Geographic cortically based hypoattenuation situated right occipital lobe is new from comparison imaging and may reflect a subacute infarct. New hypoattenuating foci in the right thalamus and basal  ganglia could reflect sequela of age-indeterminate lacunar type infarction new from 2017. A similar focus in the left insula is unchanged from comparison.   MRI head - 7 mm acute ischemic nonhemorrhagic infarct involving the ventral right thalamus. Additional 11 mm acute to early subacute ischemic nonhemorrhagic right periatrial white matter infarcts, with extension to involve the adjacent dorsal right thalamus. Late subacute to chronic ischemic infarcts involving the right parieto-occipital region, right corona radiata, and anterior left corpus callosum/corona radiata.   MRA head - Focal severe proximal right P2 stenosis, likely accounting for the extensive right PCA distribution ischemic changes. Additional severe stenosis at the right MCA bifurcation to involve the adjacent proximal M2 segments.    EEG - 07/28/2020 - This study is suggestive of moderate diffuse encephalopathy, nonspecific etiology. No seizures or epileptiform discharges were seen throughout the recording.  CTA H&N - not ordered  CT Perfusion - not ordered  Carotid Doppler - unremarkable  2D Echo - EF 55 - 60%. No cardiac source of emboli identified.   Sars Corona Virus 2 - negative  LDL - 199  HgbA1c - 10.5  UDS - negative  VTE prophylaxis - Lovenox Diet  Diet Order            DIET - DYS 1 Room service appropriate? Yes; Fluid consistency: Thin  Diet effective now                 No antithrombotic prior to admission, now on aspirin 81 mg and Plavix 75 mg daily for 3 weeks followed by aspirin alone   patient counseled to be compliant with her antithrombotic medications  Ongoing aggressive stroke risk factor management  Therapy recommendations:  [place] placement recommended by therapists  Disposition:  Pending  Hypertension  Home BP meds: Cozaar ; Norvasc  Current BP meds: metoprolol  Stable . Permissive hypertension (OK if < 220/120) but gradually normalize in 5-7  days . Long-term BP goal normotensive  Hyperlipidemia  Home Lipid lowering medication: Lipitor 80 mg daily  LDL 199, goal < 70  Current lipid lowering medication: none - consider Lipitor 80 mg daily if no contra indication  Continue statin at discharge  Diabetes  Home diabetic meds: Insulin ; metformin  Current diabetic meds: SSI   HgbA1c 10.5, goal < 7.0 Recent Labs    07/31/20 0320 07/31/20 0806 07/31/20 1204  GLUCAP 126* 134* 155*    Acute Encephalopathy  Lumbar puncture - 07/28/20 - cultures no growth day 1 ; Glucose 110 (H) ; Total protein 66 (H)  Blood cultures - no growth day 1   Vancomycin ; Rocephin ; Ampicillin ; Zovirax - started 11/20 for possible meningitis  Leukocytosis - WBC's - 14.5->12.0->9.9 (afebrile)    Persistent and recurrent worsening  of encephalopathy.  Work-up unrevealing but concerns for status epilepticus.  Improve with loading dose of Keppra.  No evidence of meningitis or other forms of infectious etiology.  Antibiotics are all discontinued except for the antiviral agents acyclovir which will likely be discontinued once the HSV PCR returns.  Other Stroke Risk Factors  Former cigarette smoker - quit  Hx stroke/TIA - prior ICH  Migraines  Congestive Heart Failure  Previous substance abuse  CAD  Other Active Problems  Code status - Full code  Suspected seizures - Keppra given but discontinued due to worsening mental status.. EEG showed no seizure activity. CKD - stage 3a  - creatinine - 1.15->1.31    Hospital day # 3 The patient's mental status appears appears to be improving and likely represents encephalopathic component from antibiotics and Keppra.  . Continue aspirin and Plavix for 3 weeks followed by aspirin alone for lacunar stroke and aggressive risk factor modification.  Expect continues ongoing improvement over the next few days.  Stroke team will sign off.  Kindly call for questions..  Discussed with Dr. Dwyane Dee.  continue ongoing management.  Greater than 50% time during this 25-minute visit was spent on counseling and coordination of care about her presentation and MRI findings and answered questions. Tiffany Contras, MD  To contact Stroke Continuity provider, please refer to [email or web address] After hours, contact General Neurology

## 2020-07-31 NOTE — Progress Notes (Signed)
message sent to Dr Myna Hidalgo, for SBP=197 mm hg after due metoprolol IV was given that was 2 hours ago

## 2020-08-01 ENCOUNTER — Encounter (HOSPITAL_COMMUNITY)
Admission: EM | Disposition: A | Discharge status: Home Health | Payer: Self-pay | Source: Home / Self Care | Attending: Family Medicine

## 2020-08-01 DIAGNOSIS — N1831 Chronic kidney disease, stage 3a: Secondary | ICD-10-CM

## 2020-08-01 DIAGNOSIS — I6330 Cerebral infarction due to thrombosis of unspecified cerebral artery: Secondary | ICD-10-CM

## 2020-08-01 DIAGNOSIS — E1165 Type 2 diabetes mellitus with hyperglycemia: Secondary | ICD-10-CM

## 2020-08-01 DIAGNOSIS — D72825 Bandemia: Secondary | ICD-10-CM

## 2020-08-01 DIAGNOSIS — I6523 Occlusion and stenosis of bilateral carotid arteries: Secondary | ICD-10-CM

## 2020-08-01 LAB — PHOSPHORUS: Phosphorus: 3.1 mg/dL (ref 2.5–4.6)

## 2020-08-01 LAB — GLUCOSE, CAPILLARY
Glucose-Capillary: 135 mg/dL — ABNORMAL HIGH (ref 70–99)
Glucose-Capillary: 135 mg/dL — ABNORMAL HIGH (ref 70–99)
Glucose-Capillary: 142 mg/dL — ABNORMAL HIGH (ref 70–99)
Glucose-Capillary: 170 mg/dL — ABNORMAL HIGH (ref 70–99)
Glucose-Capillary: 181 mg/dL — ABNORMAL HIGH (ref 70–99)
Glucose-Capillary: 192 mg/dL — ABNORMAL HIGH (ref 70–99)
Glucose-Capillary: 202 mg/dL — ABNORMAL HIGH (ref 70–99)

## 2020-08-01 LAB — HSV 1/2 PCR, CSF
HSV-1 DNA: NEGATIVE
HSV-2 DNA: NEGATIVE

## 2020-08-01 LAB — BASIC METABOLIC PANEL
Anion gap: 13 (ref 5–15)
BUN: 6 mg/dL (ref 6–20)
CO2: 23 mmol/L (ref 22–32)
Calcium: 8.5 mg/dL — ABNORMAL LOW (ref 8.9–10.3)
Chloride: 102 mmol/L (ref 98–111)
Creatinine, Ser: 1.16 mg/dL — ABNORMAL HIGH (ref 0.44–1.00)
GFR, Estimated: 55 mL/min — ABNORMAL LOW (ref >60)
Glucose, Bld: 140 mg/dL — ABNORMAL HIGH (ref 70–99)
Potassium: 3.1 mmol/L — ABNORMAL LOW (ref 3.5–5.1)
Sodium: 138 mmol/L (ref 135–145)

## 2020-08-01 LAB — CBC
HCT: 33.5 % — ABNORMAL LOW (ref 36.0–46.0)
Hemoglobin: 10.9 g/dL — ABNORMAL LOW (ref 12.0–15.0)
MCH: 27.5 pg (ref 26.0–34.0)
MCHC: 32.5 g/dL (ref 30.0–36.0)
MCV: 84.6 fL (ref 80.0–100.0)
Platelets: 450 10*3/uL — ABNORMAL HIGH (ref 150–400)
RBC: 3.96 MIL/uL (ref 3.87–5.11)
RDW: 14.3 % (ref 11.5–15.5)
WBC: 6.2 10*3/uL (ref 4.0–10.5)
nRBC: 0 % (ref 0.0–0.2)

## 2020-08-01 LAB — MAGNESIUM: Magnesium: 1.6 mg/dL — ABNORMAL LOW (ref 1.7–2.4)

## 2020-08-01 SURGERY — ECHOCARDIOGRAM, TRANSESOPHAGEAL
Anesthesia: Monitor Anesthesia Care

## 2020-08-01 MED ORDER — LOSARTAN POTASSIUM 25 MG PO TABS
25.0000 mg | ORAL_TABLET | Freq: Every day | ORAL | Status: DC
  Administered 2020-08-01: 25 mg via ORAL
  Filled 2020-08-01: qty 1

## 2020-08-01 MED ORDER — HYDRALAZINE HCL 20 MG/ML IJ SOLN
10.0000 mg | Freq: Once | INTRAMUSCULAR | Status: AC
  Administered 2020-08-01: 10 mg via INTRAVENOUS
  Filled 2020-08-01: qty 1

## 2020-08-01 MED ORDER — PANTOPRAZOLE SODIUM 40 MG PO TBEC
40.0000 mg | DELAYED_RELEASE_TABLET | Freq: Every day | ORAL | Status: DC
  Administered 2020-08-02: 40 mg via ORAL
  Filled 2020-08-01: qty 1

## 2020-08-01 MED ORDER — IRBESARTAN 150 MG PO TABS
150.0000 mg | ORAL_TABLET | Freq: Every day | ORAL | Status: DC
  Administered 2020-08-02: 150 mg via ORAL
  Filled 2020-08-01: qty 1

## 2020-08-01 MED ORDER — ATORVASTATIN CALCIUM 80 MG PO TABS
80.0000 mg | ORAL_TABLET | Freq: Every day | ORAL | Status: DC
  Administered 2020-08-01: 80 mg via ORAL
  Filled 2020-08-01: qty 1

## 2020-08-01 MED ORDER — POTASSIUM CHLORIDE 10 MEQ/100ML IV SOLN
10.0000 meq | INTRAVENOUS | Status: AC
  Administered 2020-08-01 (×3): 10 meq via INTRAVENOUS
  Filled 2020-08-01 (×3): qty 100

## 2020-08-01 MED ORDER — MAGNESIUM SULFATE 2 GM/50ML IV SOLN
2.0000 g | Freq: Once | INTRAVENOUS | Status: AC
  Administered 2020-08-01: 2 g via INTRAVENOUS
  Filled 2020-08-01: qty 50

## 2020-08-01 MED ORDER — ESCITALOPRAM OXALATE 10 MG PO TABS
10.0000 mg | ORAL_TABLET | Freq: Every day | ORAL | Status: DC
  Administered 2020-08-01 – 2020-08-02 (×2): 10 mg via ORAL
  Filled 2020-08-01 (×2): qty 1

## 2020-08-01 MED ORDER — AMLODIPINE BESYLATE 10 MG PO TABS
10.0000 mg | ORAL_TABLET | Freq: Every day | ORAL | Status: DC
  Administered 2020-08-01 – 2020-08-02 (×2): 10 mg via ORAL
  Filled 2020-08-01 (×2): qty 1

## 2020-08-01 MED ORDER — POTASSIUM CHLORIDE 20 MEQ PO PACK
40.0000 meq | PACK | Freq: Four times a day (QID) | ORAL | Status: AC
  Administered 2020-08-01 (×2): 40 meq via ORAL
  Filled 2020-08-01 (×2): qty 2

## 2020-08-01 NOTE — Progress Notes (Signed)
PROGRESS NOTE  [name] YSA:[id number] DOB: [birth year]   PCP: Mosie Lukes, MD  Patient is from: Home  DOA: 07/28/2020 LOS: 4  Chief complaints: Altered mental status  Brief Narrative / Interim history: 54 year old female with history of bilateral BKA, DM-2, NAT-5T, diastolic CHF, CAD/CABG, polyneuropathy and hypothyroidism brought to ED by EMS with altered mental status.  Initially, there was concern about possible accidental overdose with Percocet but no significant improvement with Narcan.  CT head without contrast showed subacute infarct.  Neurology consulted.  MRI brain with acute ischemic right thalamic infarct.  She also had leukocytosis that raised concern for CNS infection and possible endocarditis.  EEG negative.  Lumbar puncture unrevealing but HSV PCR pending.  TTE without vegetation.  ID consulted and discontinued antibiotics except acyclovir.  Neurology change encephalopathy could be due to Keppra and ceftriaxone.  Patient's encephalopathy improved after discontinuing Keppra and ceftriaxone.  TEE was ordered at some point but family refused given her situation and low suspicion for infection.  SLP recommended dysphagia 1 diet.  PT/OT recommended SNF.  Remains on acyclovir pending HSV PCR of CSF fluid.  Final disposition SNF  Subjective: Seen and examined earlier this morning.  No major events overnight of this morning.  She was a sleepy but awakes to voice.  Denies pain, shortness of breath, GI or UTI symptoms.  However, she is only oriented to self.  She thinks she is at the [facility].  Follows command appropriately.   Objective: Vitals:   08/01/20 0015 08/01/20 0411 08/01/20 0842 08/01/20 1221  BP: (!) 175/89 (!) 178/92 (!) 193/94 (!) 185/90  Pulse: 77 88 78 93  Resp: 15  15 18   Temp: 98.2 F (36.8 C) 97.7 F (36.5 C) 98.6 F (37 C) 98 F (36.7 C)  TempSrc: Oral Oral Oral Oral  SpO2: 98% 98% 100% 100%  Weight:      Height:        Intake/Output Summary (Last  24 hours) at 08/01/2020 1401 Last data filed at 08/01/2020 0545 Gross per 24 hour  Intake 150 ml  Output 1100 ml  Net -950 ml   Filed Weights   07/28/20 0037  Weight: 60 kg    Examination:  GENERAL: No apparent distress.  Nontoxic. HEENT: MMM.  Vision and hearing grossly intact.  NECK: Supple.  No apparent JVD.  RESP: On room air.  No IWOB.  Fair aeration bilaterally. CVS:  RRR. Heart sounds normal.  ABD/GI/GU: BS+. Abd soft, NTND.  MSK/EXT: Bilateral BKA SKIN: no apparent skin lesion or wound NEURO: Sleepy but wakes to voice easily.  Oriented to self.  No apparent focal neuro deficit. PSYCH: Calm. Normal affect.   Procedures:  Lumbar puncture  Microbiology summarized: [id number] and influenza PCR nonreactive. Blood cultures NGTD. CSF culture NGTD. HSV PCR pending.  Assessment & Plan: Acute metabolic encephalopathy-due to CVA?  MRI revealed multiple acute, subacute and chronic ischemic infarcts.  Initially concern about accidental overdose with opiate which seems to be unlikely given persistent encephalopathy with minimal improvement.  EEG did not show seizure.  Infectious work-up unrevealing.  HSV PCR pending.Reportedly, mental status improved some after discontinuing Keppra and ceftriaxone.  -Reorientation and delirium precautions. -Continue IV acyclovir pending HSV PCR. -Avoid sedating medications.   Acute right thalamic ischemic stroke Acute to early subacute ischemic nonhemorrhagic right parietal white matter infarcts Late subacute to chronic ischemic infarcts involving the right parieto-occipital regions -Noted on MRI brain.  TTE did not reveal evidence of emboli. -Neurology signed  off.  Appreciate recommendations  -DAPT with aspirin and Plavix for 3 months -Continue PT/OT/SLP  Intracranial vascular stenosis: MRA head negative for large vessel occlusion but severe stenosis of proximal right P2 and right MCA bifurcation -Management as above.   Dysphagia: SLP  recommended dysphagia 1 diet. -Continue dysphagia 1 diet and SLP evaluation and treatment  History of CAD/CABG: Stable.  No chest pain. -Continue IV metoprolol -Continue aspirin and Plavix as above -Reintroduce other cardiac meds as able.  Uncontrolled DM-2 with hyperglycemia: A1c 10.5%. Recent Labs  Lab 07/31/20 2009 08/01/20 0015 08/01/20 0410 08/01/20 0825 08/01/20 1222  GLUCAP 150* 202* 135* 135* 170*  -Continue current insulin regimen  CKD-3A: Stable. -Continue monitoring  Essential hypertension: BP elevated. -Resume home amlodipine -Continue IV metoprolol 2.5 mg every 6 hours -We will reintroduce other home BP medications slowly.  Hypothyroidism: TSH within normal. -Continue home Synthroid  Hypokalemia/hypomagnesemia -Replenish and recheck.  GERD -Change IV to p.o. Protonix.  Bilateral below the knee amputation: Stable. -Fall precaution -PT/OT  Body mass index is 24.19 kg/m.       Pressure Injury 01/05/19 Deep Tissue Injury - Purple or maroon localized area of discolored intact skin or blood-filled blister due to damage of underlying soft tissue from pressure and/or shear. (Active)  01/05/19 1758  Location: Heel  Location Orientation: Left  Staging: Deep Tissue Injury - Purple or maroon localized area of discolored intact skin or blood-filled blister due to damage of underlying soft tissue from pressure and/or shear.  Wound Description (Comments):   Present on Admission: Yes   DVT prophylaxis:  enoxaparin (LOVENOX) injection 40 mg Start: 07/28/20 1000  Code Status: Full code Family Communication: Patient and/or RN. Available if any question.  Status is: Inpatient  Remains inpatient appropriate because:Persistent severe electrolyte disturbances, Altered mental status, Unsafe d/c plan and Inpatient level of care appropriate due to severity of illness   Dispo:  Patient From: Home  Planned Disposition: Home with Health Care Svc  Expected  discharge date: 08/03/20  Medically stable for discharge: No        Consultants:  Neurology Cardiology Infectious disease   Sch Meds:  Scheduled Meds: . aspirin EC  81 mg Oral Daily  . clopidogrel  75 mg Oral Q breakfast  . enoxaparin (LOVENOX) injection  40 mg Subcutaneous Daily  . insulin aspart  0-9 Units Subcutaneous Q4H  . levothyroxine  25 mcg Oral Q0600  . metoprolol tartrate  2.5 mg Intravenous Q6H  . pantoprazole sodium  40 mg Oral Daily   Continuous Infusions: . sodium chloride 50 mL/hr at 08/01/20 0624  . acyclovir 600 mg (08/01/20 1004)   PRN Meds:.haloperidol **OR** haloperidol lactate  Antimicrobials: Anti-infectives (From admission, onward)   Start     Dose/Rate Route Frequency Ordered Stop   07/29/20 2000  vancomycin (VANCOREADY) IVPB 750 mg/150 mL  Status:  Discontinued        750 mg 150 mL/hr over 60 Minutes Intravenous Every 24 hours 07/29/20 0808 07/29/20 1515   07/28/20 2200  vancomycin (VANCOREADY) IVPB 500 mg/100 mL  Status:  Discontinued        500 mg 100 mL/hr over 60 Minutes Intravenous Every 12 hours 07/28/20 0644 07/29/20 0808   07/28/20 2000  cefTRIAXone (ROCEPHIN) 2 g in sodium chloride 0.9 % 100 mL IVPB  Status:  Discontinued        2 g 200 mL/hr over 30 Minutes Intravenous Every 12 hours 07/28/20 0644 07/29/20 1515   07/28/20 2000  acyclovir (ZOVIRAX)  600 mg in dextrose 5 % 100 mL IVPB        600 mg 112 mL/hr over 60 Minutes Intravenous Every 12 hours 07/28/20 0644     07/28/20 1400  ampicillin (OMNIPEN) 2 g in sodium chloride 0.9 % 100 mL IVPB  Status:  Discontinued        2 g 300 mL/hr over 20 Minutes Intravenous Every 6 hours 07/28/20 0644 07/29/20 1515   07/28/20 0700  vancomycin (VANCOREADY) IVPB 1500 mg/300 mL        1,500 mg 150 mL/hr over 120 Minutes Intravenous  Once 07/28/20 0621 07/28/20 1051   07/28/20 0700  acyclovir (ZOVIRAX) 600 mg in dextrose 5 % 100 mL IVPB        600 mg 112 mL/hr over 60 Minutes Intravenous  Once  07/28/20 0621 07/28/20 0913   07/28/20 0630  cefTRIAXone (ROCEPHIN) 2 g in sodium chloride 0.9 % 100 mL IVPB        2 g 200 mL/hr over 30 Minutes Intravenous  Once 07/28/20 0621 07/28/20 0804   07/28/20 0630  ampicillin (OMNIPEN) 2 g in sodium chloride 0.9 % 100 mL IVPB        2 g 300 mL/hr over 20 Minutes Intravenous  Once 07/28/20 0621 07/28/20 0842       I have personally reviewed the following labs and images: CBC: Recent Labs  Lab 07/28/20 0150 07/28/20 0150 07/28/20 0527 07/29/20 0152 07/30/20 0212 07/31/20 0408 08/01/20 0349  WBC 14.5*   < > 12.0* 9.9 8.6 8.4 6.2  NEUTROABS 12.8*  --  10.2* 6.6 5.5  --   --   HGB 11.4*   < > 10.9* 11.1* 10.9* 10.8* 10.9*  HCT 35.8*   < > 34.2* 35.1* 33.8* 33.2* 33.5*  MCV 85.2   < > 86.8 87.1 84.3 84.7 84.6  PLT 506*   < > 476* 490* 481* 442* 450*   < > = values in this interval not displayed.   BMP &GFR Recent Labs  Lab 07/28/20 0527 07/29/20 0152 07/30/20 0212 07/31/20 0408 08/01/20 0349  NA 139 140 140 140 138  K 4.3 4.4 4.0 3.4* 3.1*  CL 103 105 105 101 102  CO2 27 26 25 23 23   GLUCOSE 228* 128* 130* 138* 140*  BUN 16 14 12 10 6   CREATININE 1.15* 1.31* 1.33* 1.17* 1.16*  CALCIUM 8.8* 8.7* 8.7* 8.5* 8.5*  MG  --  1.7 1.7 1.7 1.6*  PHOS  --  4.0 3.7 3.6 3.1   Estimated Creatinine Clearance: 42.8 mL/min (A) (by C-G formula based on SCr of 1.16 mg/dL (H)). Liver & Pancreas: Recent Labs  Lab 07/28/20 0150 07/29/20 0152 07/30/20 0212  AST 17 19 31   ALT 17 17 15   ALKPHOS 91 69 76  BILITOT 0.4 0.2* 0.9  PROT 7.2 6.0* 6.2*  ALBUMIN 2.6* 2.1* 2.2*   No results for input(s): LIPASE, AMYLASE in the last 168 hours. Recent Labs  Lab 07/28/20 0150  AMMONIA 18   Diabetic: No results for input(s): HGBA1C in the last 72 hours. Recent Labs  Lab 07/31/20 2009 08/01/20 0015 08/01/20 0410 08/01/20 0825 08/01/20 1222  GLUCAP 150* 202* 135* 135* 170*   Cardiac Enzymes: No results for input(s): CKTOTAL, CKMB,  CKMBINDEX, TROPONINI in the last 168 hours. No results for input(s): PROBNP in the last 8760 hours. Coagulation Profile: No results for input(s): INR, PROTIME in the last 168 hours. Thyroid Function Tests: No results for input(s): TSH, T4TOTAL, FREET4,  T3FREE, THYROIDAB in the last 72 hours. Lipid Profile: No results for input(s): CHOL, HDL, LDLCALC, TRIG, CHOLHDL, LDLDIRECT in the last 72 hours. Anemia Panel: No results for input(s): VITAMINB12, FOLATE, FERRITIN, TIBC, IRON, RETICCTPCT in the last 72 hours. Urine analysis:    Component Value Date/Time   COLORURINE YELLOW 07/28/2020 0300   APPEARANCEUR HAZY (A) 07/28/2020 0300   LABSPEC 1.020 07/28/2020 0300   PHURINE 7.0 07/28/2020 0300   GLUCOSEU >=500 (A) 07/28/2020 0300   GLUCOSEU NEGATIVE 05/21/2015 0831   HGBUR NEGATIVE 07/28/2020 0300   BILIRUBINUR NEGATIVE 07/28/2020 0300   KETONESUR NEGATIVE 07/28/2020 0300   PROTEINUR >=300 (A) 07/28/2020 0300   UROBILINOGEN 0.2 05/21/2015 0831   NITRITE NEGATIVE 07/28/2020 0300   LEUKOCYTESUR NEGATIVE 07/28/2020 0300   Sepsis Labs: Invalid input(s): PROCALCITONIN, [place]  Microbiology: Recent Results (from the past 240 hour(s))  Respiratory Panel by RT PCR (Flu A&B, Covid) - Nasopharyngeal Swab     Status: None   Collection Time: 07/28/20  3:19 AM   Specimen: Nasopharyngeal Swab; Nasopharyngeal(NP) swabs in vial transport medium  Result Value Ref Range Status   SARS Coronavirus 2 by RT PCR NEGATIVE NEGATIVE Final    Comment: (NOTE) SARS-CoV-2 target nucleic acids are NOT DETECTED.  The SARS-CoV-2 RNA is generally detectable in upper respiratoy specimens during the acute phase of infection. The lowest concentration of SARS-CoV-2 viral copies this assay can detect is 131 copies/mL. A negative result does not preclude SARS-Cov-2 infection and should not be used as the sole basis for treatment or other patient management decisions. A negative result may occur with  improper  specimen collection/handling, submission of specimen other than nasopharyngeal swab, presence of viral mutation(s) within the areas targeted by this assay, and inadequate number of viral copies (<131 copies/mL). A negative result must be combined with clinical observations, patient history, and epidemiological information. The expected result is Negative.  Fact Sheet for Patients:  PinkCheek.be  Fact Sheet for Healthcare Providers:  GravelBags.it  This test is no t yet approved or cleared by the [place] FDA and  has been authorized for detection and/or diagnosis of SARS-CoV-2 by FDA under an Emergency Use Authorization (EUA). This EUA will remain  in effect (meaning this test can be used) for the duration of the COVID-19 declaration under Section 564(b)(1) of the Act, 21 U.S.C. section 360bbb-3(b)(1), unless the authorization is terminated or revoked sooner.     Influenza A by PCR NEGATIVE NEGATIVE Final   Influenza B by PCR NEGATIVE NEGATIVE Final    Comment: (NOTE) The Xpert Xpress SARS-CoV-2/FLU/RSV assay is intended as an aid in  the diagnosis of influenza from Nasopharyngeal swab specimens and  should not be used as a sole basis for treatment. Nasal washings and  aspirates are unacceptable for Xpert Xpress SARS-CoV-2/FLU/RSV  testing.  Fact Sheet for Patients: PinkCheek.be  Fact Sheet for Healthcare Providers: GravelBags.it  This test is not yet approved or cleared by the [place] FDA and  has been authorized for detection and/or diagnosis of SARS-CoV-2 by  FDA under an Emergency Use Authorization (EUA). This EUA will remain  in effect (meaning this test can be used) for the duration of the  Covid-19 declaration under Section 564(b)(1) of the Act, 21  U.S.C. section 360bbb-3(b)(1), unless the authorization is  terminated or revoked. Performed at  [facility], [place] [address] [place], [place] [address]   CSF culture     Status: None   Collection Time: 07/28/20  6:06 AM  Specimen: CSF; Cerebrospinal Fluid  Result Value Ref Range Status   Specimen Description CSF  Final   Special Requests NONE  Final   Gram Stain   Final    WBC PRESENT, PREDOMINANTLY MONONUCLEAR NO ORGANISMS SEEN CYTOSPIN SMEAR    Culture   Final    NO GROWTH 3 DAYS Performed at [facility] [address], [place], [place] [address]    Report Status 07/31/2020 FINAL  Final  Blood culture (routine x 2)     Status: None (Preliminary result)   Collection Time: 07/28/20  6:55 AM   Specimen: BLOOD RIGHT ARM  Result Value Ref Range Status   Specimen Description BLOOD RIGHT ARM  Final   Special Requests   Final    BOTTLES DRAWN AEROBIC AND ANAEROBIC Blood Culture adequate volume   Culture   Final    NO GROWTH 4 DAYS Performed at [facility], [place] [address], [place], [place] [address]    Report Status PENDING  Incomplete  Blood culture (routine x 2)     Status: None (Preliminary result)   Collection Time: 07/28/20  7:02 AM   Specimen: BLOOD RIGHT HAND  Result Value Ref Range Status   Specimen Description BLOOD RIGHT HAND  Final   Special Requests   Final    BOTTLES DRAWN AEROBIC AND ANAEROBIC Blood Culture results may not be optimal due to an inadequate volume of blood received in culture bottles   Culture   Final    NO GROWTH 4 DAYS Performed at [facility], [place] [address], [place], [place] [address]    Report Status PENDING  Incomplete    Radiology Studies: No results found.    [name]  If 7PM-7AM, please contact night-coverage [email or web address] 08/01/2020, 2:01 PM

## 2020-08-01 NOTE — TOC Progression Note (Signed)
Transition of Care [facility]) - Progression Note    Patient Details  Name: MYLENA SEDBERRY MRN: [id number] Date of Birth: Jul 11, 1966  Transition of Care [facility]) CM/SW Contact  Pollie Friar, RN Phone Number: 08/01/2020, 3:16 PM  Clinical Narrative:    Son called and has decided he may want pt to d/c to [facility]. CM has done FL2 and sent her information. CM has also left voicemail for admissions person. The facility would have to initiate the insurance auth.  TOC following.   Expected Discharge Plan: [place] Barriers to Discharge: Continued Medical Work up  Expected Discharge Plan and Services Expected Discharge Plan: [place]   Discharge Planning Services: CM Consult Post Acute Care Choice: Home Health, Durable Medical Equipment Living arrangements for the past 2 months: Single Family Home                 DME Arranged: Wheelchair manual DME Agency: [employer] Date DME Agency Contacted: 07/31/20   Representative spoke with at DME Agency: Freda Munro [facility] Arranged: PT, OT           Social Determinants of Health ([place]) Interventions    Readmission Risk Interventions Readmission Risk Prevention Plan 12/14/2018 07/06/2018  Transportation Screening Complete Complete  PCP or Specialist Appt within 5-7 Days - Complete  PCP or Specialist Appt within 3-5 Days Complete -  Home Care Screening - Complete  Medication Review (RN CM) - Complete  HRI or Home Care Consult Complete -  Social Work Consult for [place] Planning/Counseling Complete -  Palliative Care Screening Not Applicable -  Some recent data might be hidden

## 2020-08-01 NOTE — Progress Notes (Signed)
Pharmacy Antibiotic Note  [name] is a 54 y.o. female admitted on 07/28/2020 with AMS, possible meningitis.  Pharmacy has been consulted for antiviral dosing.   11/24: Patient continues on IV acyclovir 10mg /kg q12h for r/o encephalitis. SCr stable ~1.2 w/ great UOP and dose is appropriate for current renal function. WBC trending down and patient remains afebrile.  HSV pcr pending - if negative, plan is to d/c acyclovir.   Plan: Continue acyclovir 600 mg IV q12h Follow up with HSV PCR Monitor renal function and clinical progress   Height: 5\' 2"  (157.5 cm) Weight: 60 kg (132 lb 4.4 oz) IBW/kg (Calculated) : 50.1  Temp (24hrs), Avg:98.1 F (36.7 C), Min:97.5 F (36.4 C), Max:98.6 F (37 C)  Recent Labs  Lab 07/28/20 0527 07/29/20 0152 07/30/20 0212 07/31/20 0408 08/01/20 0349  WBC 12.0* 9.9 8.6 8.4 6.2  CREATININE 1.15* 1.31* 1.33* 1.17* 1.16*    Estimated Creatinine Clearance: 42.8 mL/min (A) (by C-G formula based on SCr of 1.16 mg/dL (H)).    Allergies  Allergen Reactions  . Lyrica [Pregabalin] Other (See Comments)    MADE PATIENT VERY EMOTIONAL AND WOULD CRY EASILY  . Morphine And Related Nausea And Vomiting  . Penicillins Other (See Comments)    GI upset related to Augmentin use  Did it involve swelling of the face/tongue/throat, SOB, or low BP? No Did it involve sudden or severe rash/hives, skin peeling, or any reaction on the inside of your mouth or nose? No Did you need to seek medical attention at a hospital or doctor's office? No When did it last happen?April 2020 If all above answers are "NO", may proceed with cephalosporin use.   . Tramadol Nausea And Vomiting    Pt cant tolerate this pain med.      Mercy Riding, PharmD PGY1 Acute Care Pharmacy Resident Please refer to [facility] for unit-specific pharmacist

## 2020-08-01 NOTE — Progress Notes (Signed)
Called and talked to patient's son [name] over the phone.  [name] likes his mother to be home as soon as she is medically stable. He thinks her mental status would improve if she is at home with family in familiar environment.  He also does not want any aggressive interventions such as surgery, TEE.Marland KitchenMarland KitchenWe have discussed about CODE STATUS. [name] says she always wanted to go peacefully and be with [name] when it is time. She hasn't been the happy women she used to be since [name] passed away. [name] says it is a hard decision to make but understands that it is very important to honor her wish, which is DNR/DNI. He would be very happy if she can be released tomorrow be with family for Thanksgiving.  He says they may not have more Thanksgiving a holiday to celebrate together.  I have alerted TOC for possible discharge tomorrow on 08/02/2020 if HSV PCR is negative and she remains stable.

## 2020-08-01 NOTE — Progress Notes (Signed)
Called by RN. Pt with elevated BP of 212/101. Is on metoprolol.  Has hx of DM that is uncontrolled.  Admitted with stroke and encephalopathy 4 days ago. Given hydralazine once now. Start Avapro for ARB therapy for HTN and DM. First dose tonight.  Monitor BP

## 2020-08-01 NOTE — NC FL2 (Addendum)
[place] LEVEL OF CARE SCREENING TOOL     IDENTIFICATION  Patient Name: [name] Birthdate: [birth year] Sex: female Admission Date (Current Location): 07/28/2020  [facility] and [place] Number:  Herbalist and Address:  [facility] [place], [place] [address], [place],  [address]      Provider Number: [id number]  Attending Physician Name and Address:  Mercy Riding, MD  Relative Name and Phone Number:       Current Level of Care: Hospital Recommended Level of Care: [place] Prior Approval Number:    Date Approved/Denied:   PASRR Number:   [id number]  Discharge Plan: SNF    Current Diagnoses: Patient Active Problem List   Diagnosis Date Noted  . Acute CVA (cerebrovascular accident) ([place])   . Leukocytosis   . AMS (altered mental status) 07/28/2020  . Cerebral thrombosis with cerebral infarction 07/28/2020  . Candidal skin infection 04/01/2020  . Polyradiculoneuropathy ([place]) 01/23/2020  . Sun-damaged skin 01/23/2020  . Uncontrolled type 2 diabetes mellitus with hyperglycemia ([place]) 11/25/2019  . Wound infection after surgery 11/25/2019  . Adjustment disorder with mixed anxiety and depressed mood   . S/P bilateral BKA (below knee amputation) ([place]) 09/27/2019  . Acute osteomyelitis of left foot ([place]) 09/21/2019  . Subacute osteomyelitis, left ankle and foot ([place])   . Hx of right BKA ([place]) 02/09/2019  . Diabetes mellitus type 2 in nonobese (HCC)   . Labile blood pressure   . Labile blood glucose   . Phantom limb pain ([place])   . CKD (chronic kidney disease), stage III ([place])   . Hypothyroidism   . Chronic diastolic congestive heart failure ([place])   . Coronary artery disease involving coronary bypass graft of native heart without angina pectoris   . Poorly controlled type 2 diabetes mellitus with peripheral neuropathy ([place])   . Acute on chronic anemia   . History of below knee amputation, right ([place])  12/31/2018  . S/P CABG x 3 06/28/2018  . Coronary artery disease involving native coronary artery of native heart with angina pectoris ([place]) 06/25/2018  . Type 2 diabetes mellitus with vascular disease ([place]) 06/24/2018  . Acute on chronic diastolic CHF (congestive heart failure) ([place]) 06/24/2018  . Non-ST elevation (NSTEMI) myocardial infarction ([place]) 06/24/2018  . Chest pain 06/23/2018  . Glaucoma 09/17/2017  . Diabetic retinopathy ([place]) 09/17/2017  . Otitis media 11/10/2016  . Decreased visual acuity 11/10/2016  . Spinal stenosis, lumbar region with neurogenic claudication 10/22/2016  . Other spondylosis with radiculopathy, lumbar region 10/13/2016  . Lipoma of abdominal wall 10/05/2016  . Severe protein-calorie malnutrition ([place]) 09/04/2016  . Anemia of chronic disease 09/04/2016  . Diabetic polyneuropathy associated with type 2 diabetes mellitus ([place]) 05/07/2016  . Orthostatic hypotension 05/07/2016  . Cerebrovascular disease 04/27/2016  . N&V (nausea and vomiting) 04/01/2016  . ICH (intracerebral hemorrhage) ([place]) 03/30/2016  . Visit for preventive health examination 05/21/2015  . Breast cancer screening 05/21/2015  . Arm lesion 07/02/2014  . Pain in limb 01/13/2014  . Neuropathic pain of both legs 01/13/2014  . Hip pain 01/12/2014  . Allergic rhinitis 03/10/2013  . Back pain 03/08/2013  . Depression 12/18/2012  . PVD (peripheral vascular disease) ([place]) 01/21/2012  . Hyperlipidemia 12/06/2010  . Overweight(278.02) 12/06/2010  . RESTLESS LEG SYNDROME 10/25/2010  . Migraine without aura 10/07/2010  . Hereditary and idiopathic peripheral neuropathy 10/07/2010  . Essential hypertension, benign 10/07/2010  . DISTURBANCE OF SKIN SENSATION 10/07/2010  .  HEART MURMUR, HX OF 10/07/2010  . NEPHROLITHIASIS, HX OF 10/07/2010    Orientation RESPIRATION BLADDER Height & Weight     Self, Place  Normal Incontinent Weight: 60 kg Height:  5\' 2"  (157.5 cm)  BEHAVIORAL SYMPTOMS/MOOD  NEUROLOGICAL BOWEL NUTRITION STATUS      Continent Diet (regular with thin liquids)  AMBULATORY STATUS COMMUNICATION OF NEEDS Skin   Extensive Assist (bilateral BKA) Verbally  (abrasions to bilateral arms and legs)                       Personal Care Assistance Level of Assistance  Bathing, Feeding, Dressing Bathing Assistance: Maximum assistance Feeding assistance: Limited assistance Dressing Assistance: Maximum assistance     Functional Limitations Info  Sight, Hearing, Speech Sight Info: Adequate Hearing Info: Adequate Speech Info: Adequate    SPECIAL CARE FACTORS FREQUENCY  PT (By licensed PT), OT (By licensed OT)     PT Frequency: 5x/wk OT Frequency: 5x/wk            Contractures Contractures Info: Not present    Additional Factors Info  Code Status, Allergies, Insulin Sliding Scale, Psychotropic Code Status Info: Full Allergies Info: Lyrica/ Morphine/ penicillins/ tramadol Psychotropic Info: Lexapro 10 mg daily Insulin Sliding Scale Info: Novolog 0-9 units SQ every 4 hours       Current Medications (08/01/2020):  This is the current hospital active medication list Current Facility-Administered Medications  Medication Dose Route Frequency Provider Last Rate Last Admin  . 0.9 %  sodium chloride infusion   Intravenous Continuous Onnie Boer Q, RPH-CPP 50 mL/hr at 08/01/20 0624 New Bag at 08/01/20 4403  . acyclovir (ZOVIRAX) 600 mg in dextrose 5 % 100 mL IVPB  600 mg Intravenous Q12H Shawna Clamp, MD 112 mL/hr at 08/01/20 1004 600 mg at 08/01/20 1004  . amLODipine (NORVASC) tablet 10 mg  10 mg Oral Daily Gonfa, Taye T, MD      . aspirin EC tablet 81 mg  81 mg Oral Daily Garvin Fila, MD   81 mg at 08/01/20 0858  . atorvastatin (LIPITOR) tablet 80 mg  80 mg Oral q1800 Gonfa, Taye T, MD      . clopidogrel (PLAVIX) tablet 75 mg  75 mg Oral Q breakfast Phillips Odor, MD   75 mg at 08/01/20 0849  . enoxaparin (LOVENOX) injection 40 mg  40 mg Subcutaneous Daily  Irene Pap N, DO   40 mg at 08/01/20 0858  . escitalopram (LEXAPRO) tablet 10 mg  10 mg Oral Daily Gonfa, Taye T, MD      . haloperidol (HALDOL) tablet 1 mg  1 mg Oral Q6H PRN Opyd, Ilene Qua, MD       Or  . haloperidol lactate (HALDOL) injection 1 mg  1 mg Intramuscular Q6H PRN Opyd, Ilene Qua, MD   1 mg at 07/30/20 4742  . insulin aspart (novoLOG) injection 0-9 Units  0-9 Units Subcutaneous Q4H Irene Pap N, DO   2 Units at 08/01/20 1242  . levothyroxine (SYNTHROID) tablet 25 mcg  25 mcg Oral Q0600 Shawna Clamp, MD   25 mcg at 08/01/20 0626  . losartan (COZAAR) tablet 25 mg  25 mg Oral Daily Gonfa, Taye T, MD      . metoprolol tartrate (LOPRESSOR) injection 2.5 mg  2.5 mg Intravenous Q6H Hall, Carole N, DO   2.5 mg at 08/01/20 1242  . pantoprazole (PROTONIX) EC tablet 40 mg  40 mg Oral Daily Mercy Riding, MD  Discharge Medications: Please see discharge summary for a list of discharge medications.  Relevant Imaging Results:  Relevant Lab Results:   Additional Information SS#: [id number]  Pollie Friar, RN

## 2020-08-02 DIAGNOSIS — Z89511 Acquired absence of right leg below knee: Secondary | ICD-10-CM

## 2020-08-02 DIAGNOSIS — R4182 Altered mental status, unspecified: Secondary | ICD-10-CM

## 2020-08-02 DIAGNOSIS — Z89512 Acquired absence of left leg below knee: Secondary | ICD-10-CM

## 2020-08-02 LAB — RENAL FUNCTION PANEL
Albumin: 2.2 g/dL — ABNORMAL LOW (ref 3.5–5.0)
Anion gap: 13 (ref 5–15)
BUN: 12 mg/dL (ref 6–20)
CO2: 22 mmol/L (ref 22–32)
Calcium: 8.6 mg/dL — ABNORMAL LOW (ref 8.9–10.3)
Chloride: 105 mmol/L (ref 98–111)
Creatinine, Ser: 1.16 mg/dL — ABNORMAL HIGH (ref 0.44–1.00)
GFR, Estimated: 55 mL/min — ABNORMAL LOW (ref >60)
Glucose, Bld: 216 mg/dL — ABNORMAL HIGH (ref 70–99)
Phosphorus: 2.6 mg/dL (ref 2.5–4.6)
Potassium: 3.6 mmol/L (ref 3.5–5.1)
Sodium: 140 mmol/L (ref 135–145)

## 2020-08-02 LAB — CULTURE, BLOOD (ROUTINE X 2)
Culture: NO GROWTH
Culture: NO GROWTH
Special Requests: ADEQUATE

## 2020-08-02 LAB — GLUCOSE, CAPILLARY
Glucose-Capillary: 228 mg/dL — ABNORMAL HIGH (ref 70–99)
Glucose-Capillary: 171 mg/dL — ABNORMAL HIGH (ref 70–99)
Glucose-Capillary: 197 mg/dL — ABNORMAL HIGH (ref 70–99)

## 2020-08-02 LAB — MAGNESIUM: Magnesium: 2.1 mg/dL (ref 1.7–2.4)

## 2020-08-02 MED ORDER — LOSARTAN POTASSIUM 50 MG PO TABS: 50.0000 mg | ORAL_TABLET | Freq: Every day | ORAL | 1 refills | Status: AC

## 2020-08-02 MED ORDER — ACETAMINOPHEN 325 MG PO TABS
650.0000 mg | ORAL_TABLET | Freq: Four times a day (QID) | ORAL | Status: DC | PRN
  Administered 2020-08-02: 650 mg via ORAL
  Filled 2020-08-02: qty 2

## 2020-08-02 MED ORDER — ASPIRIN 81 MG PO TBEC
81.0000 mg | DELAYED_RELEASE_TABLET | Freq: Every day | ORAL | 1 refills | Status: DC
Start: 2020-08-03 — End: 2021-01-01

## 2020-08-02 MED ORDER — METOPROLOL TARTRATE 25 MG PO TABS
25.0000 mg | ORAL_TABLET | Freq: Once | ORAL | Status: AC
  Administered 2020-08-02: 25 mg via ORAL
  Filled 2020-08-02: qty 1

## 2020-08-02 MED ORDER — CLOPIDOGREL BISULFATE 75 MG PO TABS
75.0000 mg | ORAL_TABLET | Freq: Every day | ORAL | 0 refills | Status: DC
Start: 2020-08-02 — End: 2020-12-25

## 2020-08-02 NOTE — Final Progress Note (Signed)
Patient discharged home with all patient belongings. Per patient son patient's pocketbook and cell phone are at home. Patient and patient son given all discharge paperwork and education, all questions and concerns addressed. Patient and son educated on all medications and changes made to medications. All questions and concerns were addressed. Patient and son also given information about follow up visits. Patient discharged via wheelchair with son  and made aware that case management was involved with ordering a new wheel chair for patient that will be delivered to their home.

## 2020-08-02 NOTE — Discharge Summary (Signed)
Physician Discharge Summary  [name] ZOX:[id number] DOB: 10-14-1965 DOA: 07/28/2020  PCP: Mosie Lukes, MD  Admit date: 07/28/2020 Discharge date: 08/02/2020  Admitted From: Home Disposition: Home  Recommendations for Outpatient Follow-up:  1. Follow ups as below. 2. Please obtain CBC/BMP/Mag at follow up 3. Please follow up on the following pending results: None  Home Health: PT/OT/SLP/RN Equipment/Devices: Light wheelchair with wheelchair cushion  Discharge Condition: Stable CODE STATUS: DNR/DNI   Follow-up Information    Mosie Lukes, MD. Schedule an appointment as soon as possible for a visit in 1 week(s).   Specialty: Family Medicine Contact information: [address] [place] [address] [phone]        Elouise Munroe, MD. Schedule an appointment as soon as possible for a visit in 2 week(s).   Specialties: Cardiology, Radiology Contact information: [address] [place] [address] [phone]               Hospital Course: 53 year old female with history of bilateral BKA, DM-2, QIO-9G, diastolic CHF, CAD/CABG, polyneuropathy and hypothyroidism brought to ED by EMS with altered mental status.  Initially, there was concern about possible accidental overdose with Percocet but no significant improvement with Narcan.  CT head without contrast showed subacute infarct.  Neurology consulted.  MRI brain with acute ischemic right thalamic infarct.  She also had leukocytosis that raised concern for CNS infection and possible endocarditis.  EEG negative.  Lumbar puncture unrevealing. TTE without vegetation.  ID consulted and discontinued antibiotics except acyclovir, which was discontinued after negative HSV PCR.  Neurology felt encephalopathy could be due to Keppra and ceftriaxone.  Patient's encephalopathy improved after discontinuing Keppra and ceftriaxone.  TEE was ordered at some point but family refused given  her situation and low suspicion for infection.  SLP recommended dysphagia 1 diet.  PT/OT recommended SNF but patient family prefer going home with home health and DME.  On the day of discharge, encephalopathy resolved.  She is awake and alert.  She is oriented to self, place and person but not time.  Of note, patient son wanted to take home for Thanksgiving holiday when I talked to him yesterday.  He stated "we may not have more Thanksgiving to celebrate together".  Discharge Diagnoses:  Acute metabolic encephalopathy-due to CVA?  MRI revealed multiple acute, subacute and chronic ischemic infarcts.  Initially concern about accidental overdose with opiate which seems to be unlikely given persistent encephalopathy with minimal improvement.  EEG did not show seizure.  Infectious work-up unrevealing.  HSV PCR negative.  Reportedly, mental status improved some after discontinuing Keppra and ceftriaxone.  On the day of discharge, awake and alert.  Oriented to self, place and person. -Discharged with home health PT/OT/SLP/RN   Acute right thalamic ischemic stroke Acute to early subacute ischemic nonhemorrhagic right parietal white matter infarcts Late subacute to chronic ischemic infarcts involving the right parieto-occipital regions -Noted on MRI brain.  TTE did not reveal evidence of emboli. -Neurology signed off.  Appreciate recommendations             -DAPT with aspirin and Plavix for 3 months  -Continue atorvastatin 80 mg daily -Home health PT/OT/SLP  Intracranial vascular stenosis: MRA head negative for large vessel occlusion but severe stenosis of proximal right P2 and right MCA bifurcation -Management as above.   Dysphagia: SLP recommended dysphagia 1 diet. -Continue dysphagia 1 diet and SLP evaluation and treatment  History of CAD/CABG: Stable.  No  chest pain. -Continue IV metoprolol -Continue aspirin and Plavix as above -Reintroduce other cardiac meds as able.  Uncontrolled  DM-2 with hyperglycemia: A1c 10.5%.  Likely due to noncompliance No results for input(s): HGBA1C in the last 72 hours. Recent Labs  Lab 08/01/20 2036 08/01/20 2353 08/02/20 0446 08/02/20 0819 08/02/20 1245  GLUCAP 181* 192* 228* 171* 197*  -Continue home medications  CKD-3A: Stable. -Recheck at follow-up  Essential hypertension: BP elevated. -Continue home amlodipine -Increased home losartan to 50 mg daily -Reassess at follow-up and adjust.  Hypothyroidism: TSH within normal. -Continue home Synthroid  Hypokalemia/hypomagnesemia: Resolved. -Replenish and recheck.  GERD -Continue home medications  Bilateral below the knee amputation: Stable. -Home health PT/OT -Light wheelchair with seat and seat cushion    Body mass index is 24.19 kg/m.        Discharge Exam: Vitals:   08/02/20 0820 08/02/20 1307  BP: (!) 158/87 (!) 174/88  Pulse: 75 88  Resp: 17 17  Temp: 97.9 F (36.6 C) 97.8 F (36.6 C)  SpO2: 98% 99%    GENERAL: No apparent distress.  Nontoxic. HEENT: MMM.  Vision and hearing grossly intact.  NECK: Supple.  No apparent JVD.  RESP:  No IWOB.  Fair aeration bilaterally. CVS:  RRR. Heart sounds normal.  ABD/GI/GU: Bowel sounds present. Soft. Non tender.  MSK/EXT: Bilateral BKA. SKIN: no apparent skin lesion or wound NEURO: Awake, alert and oriented to self, place and person.  No apparent focal neuro deficit. PSYCH: Calm. Normal affect.  Discharge Instructions  Discharge Instructions    Ambulatory referral to Neurology   Complete by: As directed    An appointment is requested in approximately: 4 weeks   Call MD for:  difficulty breathing, headache or visual disturbances   Complete by: As directed    Call MD for:  extreme fatigue   Complete by: As directed    Call MD for:  persistant dizziness or light-headedness   Complete by: As directed    Call MD for:  persistant nausea and vomiting   Complete by: As directed    Call MD for:  severe  uncontrolled pain   Complete by: As directed    Call MD for:  temperature >100.4   Complete by: As directed    Diet - low sodium heart healthy   Complete by: As directed    Dysphagia 1 (puree);Thin liquid Liquids provided via: Straw Medication Administration: Crushed with puree Supervision: Staff to assist with self feeding;Full supervision/cueing for compensatory strategies Compensations: Slow rate;Small sips/bites Postural Changes and/or Swallow Maneuvers: Seated upright 90 degrees   Diet Carb Modified   Complete by: As directed    Dysphagia 1 (puree);Thin liquid Liquids provided via: Straw Medication Administration: Crushed with puree Supervision: Staff to assist with self feeding;Full supervision/cueing for compensatory strategies Compensations: Slow rate;Small sips/bites Postural Changes and/or Swallow Maneuvers: Seated upright 90 degrees   Discharge instructions   Complete by: As directed    It has been a pleasure taking care of you!  You were hospitalized due to altered mental status.  Initially, there was concern about infection but the test is we have done have not pointed toward any specific infection.  Your MRI showed a stroke which could contribute.  We have started you on medications to reduce your risk of future stroke.  Please review your new medication list and the directions on your medications before you take them.  Follow-up with your primary care doctor in 1 to 2 weeks.  Follow-up with  neurology in 3 to 4 weeks.   Have a wonderful holiday!   Increase activity slowly   Complete by: As directed      Allergies as of 08/02/2020      Reactions   Lyrica [pregabalin] Other (See Comments)   MADE PATIENT VERY EMOTIONAL AND WOULD CRY EASILY   Morphine And Related Nausea And Vomiting   Penicillins Other (See Comments)   GI upset related to Augmentin use Did it involve swelling of the face/tongue/throat, SOB, or low BP? No Did it involve sudden or severe rash/hives,  skin peeling, or any reaction on the inside of your mouth or nose? No Did you need to seek medical attention at a hospital or doctor's office? No When did it last happen?April 2020 If all above answers are "NO", may proceed with cephalosporin use.   Tramadol Nausea And Vomiting   Pt cant tolerate this pain med.       Medication List    STOP taking these medications   HYDROcodone-acetaminophen 5-325 MG tablet Commonly known as: NORCO/VICODIN   methocarbamol 500 MG tablet Commonly known as: ROBAXIN     TAKE these medications   acetaminophen 325 MG tablet Commonly known as: TYLENOL Take 2 tablets (650 mg total) by mouth every 6 (six) hours as needed for mild pain or headache.   amLODipine 10 MG tablet Commonly known as: NORVASC TAKE 1 TABLET BY MOUTH EVERY DAY   aspirin 81 MG EC tablet Take 1 tablet (81 mg total) by mouth daily. Swallow whole. Start taking on: August 03, 2020   atorvastatin 80 MG tablet Commonly known as: LIPITOR Take 1 tablet (80 mg total) by mouth daily at 6 PM.   azelastine 0.1 % nasal spray Commonly known as: Astelin Place 2 sprays into both nostrils 2 (two) times daily. Use in each nostril as directed   blood glucose meter kit and supplies Dispense based on patient and insurance preference. Use up to four times daily as directed. DX E11.59   clopidogrel 75 MG tablet Commonly known as: PLAVIX Take 1 tablet (75 mg total) by mouth daily with breakfast.   docusate sodium 100 MG capsule Commonly known as: COLACE Take 1 capsule (100 mg total) by mouth 2 (two) times daily.   escitalopram 10 MG tablet Commonly known as: LEXAPRO TAKE 1 TABLET BY MOUTH EVERY DAY   feeding supplement (PRO-STAT 64) Liqd Take 30 mLs by mouth 3 (three) times daily with meals.   ferrous sulfate 325 (65 FE) MG EC tablet TAKE 1 TABLET BY MOUTH EVERY DAY WITH BREAKFAST What changed: See the new instructions.   fluconazole 150 MG tablet Commonly known as:  Diflucan Take 1 tablet (150 mg total) by mouth once a week. For the next 4-8 weeks.   insulin glargine 100 UNIT/ML Solostar Pen Commonly known as: LANTUS 27 units SQ qhs and then increase by 2 units every 3 days if no sugars below 150   levothyroxine 25 MCG tablet Commonly known as: SYNTHROID TAKE 1 TABLET (25 MCG TOTAL) BY MOUTH DAILY BEFORE BREAKFAST. What changed:   how much to take  how to take this  when to take this  additional instructions   losartan 50 MG tablet Commonly known as: COZAAR Take 1 tablet (50 mg total) by mouth daily. What changed:   medication strength  See the new instructions.   metFORMIN 1000 MG tablet Commonly known as: GLUCOPHAGE Take 1 tablet (1,000 mg total) by mouth 2 (two) times daily with a meal.  metoCLOPramide 5 MG tablet Commonly known as: REGLAN Take 1 tablet (5 mg total) by mouth 4 (four) times daily -  before meals and at bedtime.   multivitamin with minerals Tabs tablet Take 1 tablet by mouth daily.   NovoLOG FlexPen 100 UNIT/ML FlexPen Generic drug: insulin aspart Inject 4 Units into the skin 3 (three) times daily with meals.   nystatin cream Commonly known as: MYCOSTATIN Apply 1 application topically 2 (two) times daily.   oxyCODONE-acetaminophen 5-325 MG tablet Commonly known as: Percocet Take 1 tablet by mouth 2 (two) times daily as needed for severe pain.   pantoprazole 40 MG tablet Commonly known as: PROTONIX Take 1 tablet (40 mg total) by mouth daily.   PEN NEEDLES 31GX5/16" 31G X 8 MM Misc Use as directed with Lantus and Novolog pen   polyethylene glycol 17 g packet Commonly known as: MIRALAX / GLYCOLAX Take 17 g by mouth daily.            Durable Medical Equipment  (From admission, onward)         Start     Ordered   08/02/20 1236  For home use only DME lightweight manual wheelchair with seat cushion  Once       Comments: Patient suffers from  weakness BKA which impairs their ability to perform  daily activities like ADLin the home.  A  Walker will not resolve  issue with performing activities of daily living. A wheelchair will allow patient to safely perform daily activities. Patient is not able to propel themselves in the home using a standard weight wheelchair due to weakness Patient can self propel in the lightweight wheelchair. Length of need lifetime Accessories: elevating leg rests (ELRs), wheel locks, extensions and anti-tippers.   08/02/20 1237   07/31/20 1200  For home use only DME wheelchair cushion (seat and back)  Once        07/31/20 1159   07/31/20 1155  For home use only DME lightweight manual wheelchair with seat cushion  Once       Comments: Patient suffers from BKA which impairs their ability to perform daily activities like ambulating in the home.  A walker will not resolve  issue with performing activities of daily living. A wheelchair will allow patient to safely perform daily activities. Patient is not able to propel themselves in the home using a standard weight wheelchair due to weakness. Patient can self propel in the lightweight wheelchair. Length of need lifetime. Accessories: elevating leg rests (ELRs), wheel locks, extensions and anti-tippers.   07/31/20 1155          Consultations:  Neurology  Infectious disease  Cardiology  Procedures/Studies:  2D Echo on 07/29/2020 1. Left ventricular ejection fraction, by estimation, is 55 to 60%. The  left ventricle has normal function. The left ventricle has no regional  wall motion abnormalities. Left ventricular diastolic parameters are  consistent with Grade II diastolic  dysfunction (pseudonormalization). Elevated left ventricular end-diastolic  pressure.  2. Right ventricular systolic function is normal. The right ventricular  size is normal.  3. The mitral valve is normal in structure. Trivial mitral valve  regurgitation. No evidence of mitral stenosis.  4. The aortic valve is tricuspid.  Aortic valve regurgitation is not  visualized. Mild aortic valve sclerosis is present, with no evidence of  aortic valve stenosis.  5. The inferior vena cava is normal in size with greater than 50%  respiratory variability, suggesting right atrial pressure of 3 mmHg.  6. Agitated saline contrast bubble study was negative, with no evidence  of any interatrial shunt.    CT HEAD WO CONTRAST  Result Date: 07/28/2020 CLINICAL DATA:  54 year old female with altered mental status, right thalamic lacunar infarct, subacute ischemia elsewhere in the right PCA territory on MRI this morning. Right MCA and PCA stenoses on MRA. EXAM: CT HEAD WITHOUT CONTRAST TECHNIQUE: Contiguous axial images were obtained from the base of the skull through the vertex without intravenous contrast. COMPARISON:  Brain MRI, intracranial MRA, plain head CT earlier today. FINDINGS: Brain: No midline shift, mass effect, or evidence of intracranial mass lesion. No ventriculomegaly. No acute intracranial hemorrhage identified. Patchy hypodensity in the right thalamus and elsewhere in the right PCA territory corresponding to a mix of acute, subacute, and chronic ischemia. Unchanged CT appearance from 0128 hours today. Superimposed chronic small infarcts also in the right basal ganglia/internal capsule, right cerebellum. Vascular: Calcified atherosclerosis at the skull base. No suspicious intracranial vascular hyperdensity. Skull: No acute osseous abnormality identified. Sinuses/Orbits: Visualized paranasal sinuses and mastoids are stable. Other: No acute orbit or scalp soft tissue finding. IMPRESSION: 1. Stable CT appearance of the brain from earlier today today with a mix of acute, subacute, and chronic ischemia in the Right PCA territory. No associated hemorrhage or mass effect. 2. Chronic small-vessel ischemia elsewhere. No new intracranial abnormality. Electronically Signed   By: Genevie Ann M.D.   On: 07/28/2020 12:25   CT Head Wo  Contrast  Result Date: 07/28/2020 CLINICAL DATA:  Mental status change EXAM: CT HEAD WITHOUT CONTRAST TECHNIQUE: Contiguous axial images were obtained from the base of the skull through the vertex without intravenous contrast. COMPARISON:  CT and MRI 03/31/2016 FINDINGS: Brain: Geographic cortically based hypoattenuation situated right occipital lobe is new from comparison imaging and may reflect a subacute infarct. Additional scattered hypoattenuating foci in the left external capsule/insula, right thalamus and basal ganglia may reflect sequela more age indeterminate though likely remote lacunar type infarcts though the latter findings in the thalamus and basal ganglia are new from 2017. No evidence of acute hemorrhage, hydrocephalus, extra-axial collection, visible mass lesion or mass effect. Symmetric prominence of the ventricles, cisterns and sulci compatible with parenchymal volume loss. More confluent areas of white matter hypoattenuation are most compatible with chronic microvascular angiopathy. Vascular: Atherosclerotic calcification of the carotid siphons and intradural vertebral arteries. No hyperdense vessel. Skull: No calvarial fracture or suspicious osseous lesion. No scalp swelling or hematoma. Sinuses/Orbits: Paranasal sinuses and mastoid air cells are predominantly clear. Left lens extraction. Included orbital contents otherwise unremarkable. Other: None. IMPRESSION: 1. Geographic cortically based hypoattenuation situated right occipital lobe is new from comparison imaging and may reflect a subacute infarct. 2. New hypoattenuating foci in the right thalamus and basal ganglia could reflect sequela of age-indeterminate lacunar type infarction new from 2017. A similar focus in the left insula is unchanged from comparison. 3. Chronic microvascular angiopathy and parenchymal volume loss. Critical Value/emergent results were called by telephone at the time of interpretation on 07/28/2020 at 3:01 am to  provider [facility] , who verbally acknowledged these results. Electronically Signed   By: Lovena Le M.D.   On: 07/28/2020 03:01   MR ANGIO HEAD WO CONTRAST  Result Date: 07/28/2020 CLINICAL DATA:  Follow-up examination for acute stroke. EXAM: MRI HEAD WITHOUT CONTRAST MRA HEAD WITHOUT CONTRAST TECHNIQUE: Multiplanar, multiecho pulse sequences of the brain and surrounding structures were obtained without intravenous contrast. Angiographic images of the head were obtained using MRA technique without  contrast. COMPARISON:  Prior CT from earlier the same day. FINDINGS: MRI HEAD FINDINGS Brain: Generalized age-related cerebral atrophy. Patchy and confluent T2/FLAIR hyperintensity within the periventricular deep white matter both cerebral hemispheres most consistent with chronic small vessel ischemic disease, moderate in nature. Remote lacunar infarcts noted at the right basal ganglia/corona radiata and left thalamus. Patchy involvement of the pons noted. Probable few additional remote lacunar infarcts noted about the cerebellum near the roof of the fourth ventricle (series 16, image 14). Encephalomalacia gliosis extending from the right periatrial white matter towards the overlying right parieto-occipital region consistent with right PCA territory ischemic change. Mild residual diffusion abnormality without ADC correlate, felt to be most consistent with a late subacute to chronic ischemic infarct. Similar late subacute to chronic ischemic change noted involving the right corona radiata (series 5, image 78, left genu of the anterior corpus callosum (series 5, image 79, and left frontal corona radiata (series 5, image 77). Associated scattered areas of susceptibility artifact could reflect hemosiderin staining and/or laminar necrosis. There is additional 7 mm focus of restricted diffusion involving the ventral right thalamus (series 5, image 73). Associated signal loss on corresponding ADC map, consistent with  an acute ischemic infarct. Patchy extension towards the periventricular aspect of the third ventricle (series 5, image 71). Additional 11 mm focus of more mild diffusion signal abnormality involving the right periatrial white matter also demonstrates signal loss on corresponding ADC map (series 6, image 25), consistent with acute to subacute ischemia. Extension into the adjacent dorsal thalamus (series 5, image 71). No associated hemorrhage about these more acute infarcts. No other evidence for acute or subacute ischemia. Gray-white matter differentiation otherwise maintained. No evidence for acute intracranial hemorrhage. No mass lesion, midline shift or mass effect. Ventricles normal size without hydrocephalus. No extra-axial fluid collection. Pituitary gland suprasellar region within normal limits. Midline structures intact. Vascular: Major intracranial vascular flow voids are maintained. Asymmetric FLAIR signal intensity within the left transverse and sigmoid sinuses without associated T1 correlate favored to be related to slow/sluggish flow rather than thrombus. Skull and upper cervical spine: Craniocervical junction within normal limits. Bone marrow signal intensity normal. No scalp soft tissue abnormality. Sinuses/Orbits: Postoperative changes as noted at the left globe including ocular lens replacement. Triangular FLAIR density at the left right none noted, which could be postoperative as well (series 11, image 9). Mild scattered mucosal thickening noted about the ethmoidal air cells, left sphenoid sinus, and right maxillary sinus. Paranasal sinuses are otherwise clear. No mastoid effusion. Inner ear structures grossly normal. Other: None. MRA HEAD FINDINGS ANTERIOR CIRCULATION: Examination mildly degraded by motion artifact. Visualized distal cervical segments of the internal carotid arteries are patent with antegrade flow. Petrous segments patent bilaterally. Scattered atheromatous irregularity within the  carotid siphons without hemodynamically significant stenosis. A1 segments patent bilaterally. Normal anterior communicating artery complex. Anterior cerebral arteries patent to their distal aspects without high-grade stenosis. Short-segment mild-to-moderate proximal left M1 stenosis (series 1039, image 13). Left M1 bifurcates early. Distal left MCA branches well perfused although demonstrate diffuse small vessel atheromatous irregularity. Right M1 widely patent. Focal severe stenosis at the right MCA bifurcation to involve the adjacent proximal M2 segments (series 1039, image 11). Right MCA branches perfused distally although demonstrates small vessel atheromatous irregularity. POSTERIOR CIRCULATION: Both vertebral arteries patent to the vertebrobasilar junction without stenosis. Left vertebral artery slightly dominant. Both picas patent at their origins. Basilar widely patent proximally, with mild narrowing at the basilar tip. Superior cerebral arteries grossly patent bilaterally. Right  PCA supplied via the basilar. There is a focal severe proximal right P2 stenosis, measuring approximately 4 mm in length (series 1051, image 11). Right PCA irregular but perfused distally. Fetal type origin of the left PCA. Left PCA also irregular but remains patent to its distal aspect. Distal moderate to severe bilateral P3 stenoses noted (series 1057, image 18). No aneurysm. IMPRESSION: MRI HEAD IMPRESSION: 1. 7 mm acute ischemic nonhemorrhagic infarct involving the ventral right thalamus. 2. Additional 11 mm acute to early subacute ischemic nonhemorrhagic right periatrial white matter infarcts, with extension to involve the adjacent dorsal right thalamus. 3. Late subacute to chronic ischemic infarcts involving the right parieto-occipital region, right corona radiata, and anterior left corpus callosum/corona radiata. 4. Underlying moderate chronic small vessel ischemic disease with multiple remote lacunar infarcts as above. MRA  HEAD IMPRESSION: 1. Negative intracranial MRA for large vessel occlusion. 2. Focal severe proximal right P2 stenosis, likely accounting for the extensive right PCA distribution ischemic changes. 3. Additional severe stenosis at the right MCA bifurcation to involve the adjacent proximal M2 segments. 4. Moderate distal small vessel atheromatous irregularity throughout the intracranial circulation. Electronically Signed   By: Jeannine Boga M.D.   On: 07/28/2020 06:12   MR BRAIN WO CONTRAST  Result Date: 07/28/2020 CLINICAL DATA:  Follow-up examination for acute stroke. EXAM: MRI HEAD WITHOUT CONTRAST MRA HEAD WITHOUT CONTRAST TECHNIQUE: Multiplanar, multiecho pulse sequences of the brain and surrounding structures were obtained without intravenous contrast. Angiographic images of the head were obtained using MRA technique without contrast. COMPARISON:  Prior CT from earlier the same day. FINDINGS: MRI HEAD FINDINGS Brain: Generalized age-related cerebral atrophy. Patchy and confluent T2/FLAIR hyperintensity within the periventricular deep white matter both cerebral hemispheres most consistent with chronic small vessel ischemic disease, moderate in nature. Remote lacunar infarcts noted at the right basal ganglia/corona radiata and left thalamus. Patchy involvement of the pons noted. Probable few additional remote lacunar infarcts noted about the cerebellum near the roof of the fourth ventricle (series 16, image 14). Encephalomalacia gliosis extending from the right periatrial white matter towards the overlying right parieto-occipital region consistent with right PCA territory ischemic change. Mild residual diffusion abnormality without ADC correlate, felt to be most consistent with a late subacute to chronic ischemic infarct. Similar late subacute to chronic ischemic change noted involving the right corona radiata (series 5, image 78, left genu of the anterior corpus callosum (series 5, image 79, and left  frontal corona radiata (series 5, image 77). Associated scattered areas of susceptibility artifact could reflect hemosiderin staining and/or laminar necrosis. There is additional 7 mm focus of restricted diffusion involving the ventral right thalamus (series 5, image 73). Associated signal loss on corresponding ADC map, consistent with an acute ischemic infarct. Patchy extension towards the periventricular aspect of the third ventricle (series 5, image 71). Additional 11 mm focus of more mild diffusion signal abnormality involving the right periatrial white matter also demonstrates signal loss on corresponding ADC map (series 6, image 25), consistent with acute to subacute ischemia. Extension into the adjacent dorsal thalamus (series 5, image 71). No associated hemorrhage about these more acute infarcts. No other evidence for acute or subacute ischemia. Gray-white matter differentiation otherwise maintained. No evidence for acute intracranial hemorrhage. No mass lesion, midline shift or mass effect. Ventricles normal size without hydrocephalus. No extra-axial fluid collection. Pituitary gland suprasellar region within normal limits. Midline structures intact. Vascular: Major intracranial vascular flow voids are maintained. Asymmetric FLAIR signal intensity within the left transverse and sigmoid  sinuses without associated T1 correlate favored to be related to slow/sluggish flow rather than thrombus. Skull and upper cervical spine: Craniocervical junction within normal limits. Bone marrow signal intensity normal. No scalp soft tissue abnormality. Sinuses/Orbits: Postoperative changes as noted at the left globe including ocular lens replacement. Triangular FLAIR density at the left right none noted, which could be postoperative as well (series 11, image 9). Mild scattered mucosal thickening noted about the ethmoidal air cells, left sphenoid sinus, and right maxillary sinus. Paranasal sinuses are otherwise clear. No  mastoid effusion. Inner ear structures grossly normal. Other: None. MRA HEAD FINDINGS ANTERIOR CIRCULATION: Examination mildly degraded by motion artifact. Visualized distal cervical segments of the internal carotid arteries are patent with antegrade flow. Petrous segments patent bilaterally. Scattered atheromatous irregularity within the carotid siphons without hemodynamically significant stenosis. A1 segments patent bilaterally. Normal anterior communicating artery complex. Anterior cerebral arteries patent to their distal aspects without high-grade stenosis. Short-segment mild-to-moderate proximal left M1 stenosis (series 1039, image 13). Left M1 bifurcates early. Distal left MCA branches well perfused although demonstrate diffuse small vessel atheromatous irregularity. Right M1 widely patent. Focal severe stenosis at the right MCA bifurcation to involve the adjacent proximal M2 segments (series 1039, image 11). Right MCA branches perfused distally although demonstrates small vessel atheromatous irregularity. POSTERIOR CIRCULATION: Both vertebral arteries patent to the vertebrobasilar junction without stenosis. Left vertebral artery slightly dominant. Both picas patent at their origins. Basilar widely patent proximally, with mild narrowing at the basilar tip. Superior cerebral arteries grossly patent bilaterally. Right PCA supplied via the basilar. There is a focal severe proximal right P2 stenosis, measuring approximately 4 mm in length (series 1051, image 11). Right PCA irregular but perfused distally. Fetal type origin of the left PCA. Left PCA also irregular but remains patent to its distal aspect. Distal moderate to severe bilateral P3 stenoses noted (series 1057, image 18). No aneurysm. IMPRESSION: MRI HEAD IMPRESSION: 1. 7 mm acute ischemic nonhemorrhagic infarct involving the ventral right thalamus. 2. Additional 11 mm acute to early subacute ischemic nonhemorrhagic right periatrial white matter infarcts,  with extension to involve the adjacent dorsal right thalamus. 3. Late subacute to chronic ischemic infarcts involving the right parieto-occipital region, right corona radiata, and anterior left corpus callosum/corona radiata. 4. Underlying moderate chronic small vessel ischemic disease with multiple remote lacunar infarcts as above. MRA HEAD IMPRESSION: 1. Negative intracranial MRA for large vessel occlusion. 2. Focal severe proximal right P2 stenosis, likely accounting for the extensive right PCA distribution ischemic changes. 3. Additional severe stenosis at the right MCA bifurcation to involve the adjacent proximal M2 segments. 4. Moderate distal small vessel atheromatous irregularity throughout the intracranial circulation. Electronically Signed   By: Jeannine Boga M.D.   On: 07/28/2020 06:12   DG Chest Portable 1 View  Result Date: 07/28/2020 CLINICAL DATA:  Altered mental status, increasing lethargy EXAM: PORTABLE CHEST 1 VIEW COMPARISON:  Radiograph 07/13/2018 FINDINGS: Low lung volumes and streaky atelectatic changes. Mild vascular congestion without features of frank edema. No consolidative opacity. No pneumothorax or visible effusion. Postsurgical changes related to prior CABG including intact and aligned sternotomy wires and multiple surgical clips projecting over the mediastinum. The aorta is calcified. The remaining cardiomediastinal contours are unremarkable. No acute osseous or soft tissue abnormality. Telemetry leads overlie the chest. IMPRESSION: 1. Low lung volumes, atelectasis 2. Mild vascular congestion without features of frank edema. 3. Prior sternotomy and CABG. Electronically Signed   By: Lovena Le M.D.   On: 07/28/2020 03:52  EEG adult  Result Date: 07/28/2020 Lora Havens, MD     07/28/2020  4:34 PM Patient Name: [name] MRN: [id number] Epilepsy Attending: Lora Havens Referring Physician/Provider: Mikey Bussing, PA Date: 07/28/2020 Duration: 26.18  mins  atient history: 54yo F with chronic right occipital infarct, acre right thalamic infarct with worsening ams. EEG to evaluate for seizure Level of alertness: Awake, asleep AEDs during EEG study: None Technical aspects: This EEG study was done with scalp electrodes positioned according to the 10-20 International system of electrode placement. Electrical activity was acquired at a sampling rate of _0  and reviewed with a high frequency filter of _1  and a low frequency filter of _2 . EEG data were recorded continuously and digitally stored. Description: No clear posterior dominant rhythm was seen. Sleep was characterized by vertex waves, sleep spindles (12 to 14 Hz), maximal frontocentral region. EEG showed continuous generalized  to 6 Hz theta as well as intermittent rhythmic rhythmic delta slowing. Hyperventilation and photic stimulation were not performed.   ABNORMALITY -Continued slow, generalized -Intermittent rhythmic delta slow, generalized IMPRESSION: This study is suggestive of moderate diffuse encephalopathy, nonspecific etiology. No seizures or epileptiform discharges were seen throughout the recording. Lora Havens   ECHOCARDIOGRAM COMPLETE BUBBLE STUDY  Result Date: 07/29/2020    ECHOCARDIOGRAM REPORT   Patient Name:   [name] Date of Exam: 07/29/2020 Medical Rec #:  [id number]         Height:       62.0 in Accession #:    [id number]        Weight:       132.3 lb Date of Birth:  [birth year]          BSA:          1.604 m Patient Age:    3 years          BP:           128/69 mmHg Patient Gender: F                 HR:           78 bpm. Exam Location:  Inpatient Procedure: 2D Echo and Saline Contrast Bubble Study Indications:    stroke 434.91  History:        Patient has prior history of Echocardiogram examinations, most                 recent 06/28/2018.  Sonographer:    Johny Chess Referring Phys: [id number] [place]  1. Left ventricular ejection fraction, by  estimation, is 55 to 60%. The left ventricle has normal function. The left ventricle has no regional wall motion abnormalities. Left ventricular diastolic parameters are consistent with Grade II diastolic dysfunction (pseudonormalization). Elevated left ventricular end-diastolic pressure.  2. Right ventricular systolic function is normal. The right ventricular size is normal.  3. The mitral valve is normal in structure. Trivial mitral valve regurgitation. No evidence of mitral stenosis.  4. The aortic valve is tricuspid. Aortic valve regurgitation is not visualized. Mild aortic valve sclerosis is present, with no evidence of aortic valve stenosis.  5. The inferior vena cava is normal in size with greater than 50% respiratory variability, suggesting right atrial pressure of 3 mmHg.  6. Agitated saline contrast bubble study was negative, with no evidence of any interatrial shunt. FINDINGS  Left Ventricle: Left ventricular ejection fraction, by estimation, is 55 to 60%. The left ventricle has normal function. The left ventricle  has no regional wall motion abnormalities. The left ventricular internal cavity size was normal in size. There is  no left ventricular hypertrophy. Left ventricular diastolic parameters are consistent with Grade II diastolic dysfunction (pseudonormalization). Elevated left ventricular end-diastolic pressure. Right Ventricle: The right ventricular size is normal. No increase in right ventricular wall thickness. Right ventricular systolic function is normal. Left Atrium: Left atrial size was normal in size. Right Atrium: Right atrial size was normal in size. Pericardium: There is no evidence of pericardial effusion. Mitral Valve: The mitral valve is normal in structure. There is mild thickening of the mitral valve leaflet(s). There is mild calcification of the mitral valve leaflet(s). Mild mitral annular calcification. Trivial mitral valve regurgitation. No evidence  of mitral valve stenosis.  Tricuspid Valve: The tricuspid valve is normal in structure. Tricuspid valve regurgitation is trivial. No evidence of tricuspid stenosis. Aortic Valve: The aortic valve is tricuspid. Aortic valve regurgitation is not visualized. Mild aortic valve sclerosis is present, with no evidence of aortic valve stenosis. Pulmonic Valve: The pulmonic valve was normal in structure. Pulmonic valve regurgitation is not visualized. No evidence of pulmonic stenosis. Aorta: The aortic root is normal in size and structure. Venous: The inferior vena cava is normal in size with greater than 50% respiratory variability, suggesting right atrial pressure of 3 mmHg. IAS/Shunts: No atrial level shunt detected by color flow Doppler. Agitated saline contrast was given intravenously to evaluate for intracardiac shunting. Agitated saline contrast bubble study was negative, with no evidence of any interatrial shunt.  LEFT VENTRICLE PLAX 2D LVIDd:         4.00 cm  Diastology LVIDs:         2.70 cm  LV e' medial:    3.92 cm/s LV PW:         1.10 cm  LV E/e' medial:  22.1 LV IVS:        1.00 cm  LV e' lateral:   7.29 cm/s LVOT diam:     1.70 cm  LV E/e' lateral: 11.9 LV SV:         33 LV SV Index:   21 LVOT Area:     2.27 cm  RIGHT VENTRICLE            IVC RV S prime:     9.57 cm/s  IVC diam: 1.50 cm TAPSE (M-mode): 2.4 cm LEFT ATRIUM             Index       RIGHT ATRIUM          Index LA diam:        2.90 cm 1.81 cm/m  RA Area:     8.52 cm LA Vol (A2C):   28.3 ml 17.65 ml/m RA Volume:   14.80 ml 9.23 ml/m LA Vol (A4C):   29.0 ml 18.08 ml/m LA Biplane Vol: 30.0 ml 18.71 ml/m  AORTIC VALVE LVOT Vmax:   75.10 cm/s LVOT Vmean:  47.100 cm/s LVOT VTI:    0.146 m  AORTA Ao Root diam: 2.70 cm Ao Asc diam:  3.00 cm MITRAL VALVE MV Area (PHT): 3.53 cm    SHUNTS MV Decel Time: 215 msec    Systemic VTI:  0.15 m MV E velocity: 86.50 cm/s  Systemic Diam: 1.70 cm MV A velocity: 99.40 cm/s MV E/A ratio:  0.87 Jenkins Rouge MD Electronically signed by Jenkins Rouge MD Signature Date/Time: 07/29/2020/9:21:25 AM    Final    VAS US CAROTID  Result  Date: 07/29/2020 Carotid Arterial Duplex Study Indications:       CVA. Risk Factors:      Hypertension, hyperlipidemia, Diabetes, coronary artery                    disease, prior CVA. Comparison Study:  04/01/16 previous Performing Technologist: Abram Sander RVS  Examination Guidelines: A complete evaluation includes B-mode imaging, spectral Doppler, color Doppler, and power Doppler as needed of all accessible portions of each vessel. Bilateral testing is considered an integral part of a complete examination. Limited examinations for reoccurring indications may be performed as noted.  Right Carotid Findings: +----------+--------+--------+--------+------------------+--------+           PSV cm/sEDV cm/sStenosisPlaque DescriptionComments +----------+--------+--------+--------+------------------+--------+ CCA Prox  61      13              heterogenous               +----------+--------+--------+--------+------------------+--------+ CCA Distal60      15              heterogenous               +----------+--------+--------+--------+------------------+--------+ ICA Prox  102     26      1-39%   heterogenous               +----------+--------+--------+--------+------------------+--------+ ICA Distal64      14                                         +----------+--------+--------+--------+------------------+--------+ ECA       135     15                                         +----------+--------+--------+--------+------------------+--------+ +----------+--------+-------+--------+-------------------+           PSV cm/sEDV cmsDescribeArm Pressure (mmHG) +----------+--------+-------+--------+-------------------+ [id number]                                         +----------+--------+-------+--------+-------------------+ +---------+--------+--+--------+-+---------+ VertebralPSV  cm/s24EDV cm/s5Antegrade +---------+--------+--+--------+-+---------+  Left Carotid Findings: +----------+--------+--------+--------+------------------+--------+           PSV cm/sEDV cm/sStenosisPlaque DescriptionComments +----------+--------+--------+--------+------------------+--------+ CCA Prox  94      18              heterogenous               +----------+--------+--------+--------+------------------+--------+ CCA Distal80      15              heterogenous               +----------+--------+--------+--------+------------------+--------+ ICA Prox  82      24      1-39%   heterogenous               +----------+--------+--------+--------+------------------+--------+ ICA Distal60      17                                         +----------+--------+--------+--------+------------------+--------+ ECA       116     14                                         +----------+--------+--------+--------+------------------+--------+ +----------+--------+--------+--------+-------------------+  PSV cm/sEDV cm/sDescribeArm Pressure (mmHG) +----------+--------+--------+--------+-------------------+ [id number]                                         +----------+--------+--------+--------+-------------------+ +---------+--------+--+--------+-+---------+ VertebralPSV cm/s29EDV cm/s7Antegrade +---------+--------+--+--------+-+---------+   Summary: Right Carotid: Velocities in the right ICA are consistent with a 1-39% stenosis. Left Carotid: Velocities in the left ICA are consistent with a 1-39% stenosis. Vertebrals: Bilateral vertebral arteries demonstrate antegrade flow. *See table(s) above for measurements and observations.  Electronically signed by Deitra Mayo MD on 07/29/2020 at 6:22:54 PM.    Final        The results of significant diagnostics from this hospitalization (including imaging, microbiology, ancillary and laboratory) are listed  below for reference.     Microbiology: Recent Results (from the past 240 hour(s))  Respiratory Panel by RT PCR (Flu A&B, Covid) - Nasopharyngeal Swab     Status: None   Collection Time: 07/28/20  3:19 AM   Specimen: Nasopharyngeal Swab; Nasopharyngeal(NP) swabs in vial transport medium  Result Value Ref Range Status   SARS Coronavirus 2 by RT PCR NEGATIVE NEGATIVE Final    Comment: (NOTE) SARS-CoV-2 target nucleic acids are NOT DETECTED.  The SARS-CoV-2 RNA is generally detectable in upper respiratoy specimens during the acute phase of infection. The lowest concentration of SARS-CoV-2 viral copies this assay can detect is 131 copies/mL. A negative result does not preclude SARS-Cov-2 infection and should not be used as the sole basis for treatment or other patient management decisions. A negative result may occur with  improper specimen collection/handling, submission of specimen other than nasopharyngeal swab, presence of viral mutation(s) within the areas targeted by this assay, and inadequate number of viral copies (<131 copies/mL). A negative result must be combined with clinical observations, patient history, and epidemiological information. The expected result is Negative.  Fact Sheet for Patients:  PinkCheek.be  Fact Sheet for Healthcare Providers:  GravelBags.it  This test is no t yet approved or cleared by the [place] FDA and  has been authorized for detection and/or diagnosis of SARS-CoV-2 by FDA under an Emergency Use Authorization (EUA). This EUA will remain  in effect (meaning this test can be used) for the duration of the COVID-19 declaration under Section 564(b)(1) of the Act, 21 U.S.C. section 360bbb-3(b)(1), unless the authorization is terminated or revoked sooner.     Influenza A by PCR NEGATIVE NEGATIVE Final   Influenza B by PCR NEGATIVE NEGATIVE Final    Comment: (NOTE) The Xpert Xpress  SARS-CoV-2/FLU/RSV assay is intended as an aid in  the diagnosis of influenza from Nasopharyngeal swab specimens and  should not be used as a sole basis for treatment. Nasal washings and  aspirates are unacceptable for Xpert Xpress SARS-CoV-2/FLU/RSV  testing.  Fact Sheet for Patients: PinkCheek.be  Fact Sheet for Healthcare Providers: GravelBags.it  This test is not yet approved or cleared by the [place] FDA and  has been authorized for detection and/or diagnosis of SARS-CoV-2 by  FDA under an Emergency Use Authorization (EUA). This EUA will remain  in effect (meaning this test can be used) for the duration of the  Covid-19 declaration under Section 564(b)(1) of the Act, 21  U.S.C. section 360bbb-3(b)(1), unless the authorization is  terminated or revoked. Performed at [facility], [place] [address] [place], [place] [address]   CSF culture     Status: None   Collection Time: 07/28/20  6:06 AM   Specimen: CSF; Cerebrospinal Fluid  Result Value Ref Range Status   Specimen Description CSF  Final   Special Requests NONE  Final   Gram Stain   Final    WBC PRESENT, PREDOMINANTLY MONONUCLEAR NO ORGANISMS SEEN CYTOSPIN SMEAR    Culture   Final    NO GROWTH 3 DAYS Performed at [facility] [address], [place], [place] [address]    Report Status 07/31/2020 FINAL  Final  Blood culture (routine x 2)     Status: None   Collection Time: 07/28/20  6:55 AM   Specimen: BLOOD RIGHT ARM  Result Value Ref Range Status   Specimen Description BLOOD RIGHT ARM  Final   Special Requests   Final    BOTTLES DRAWN AEROBIC AND ANAEROBIC Blood Culture adequate volume   Culture   Final    NO GROWTH 5 DAYS Performed at [facility], [place] [address] [place], [place] [address]    Report Status 08/02/2020 FINAL  Final  Blood culture (routine x 2)     Status: None   Collection Time: 07/28/20  7:02 AM   Specimen:  BLOOD RIGHT HAND  Result Value Ref Range Status   Specimen Description BLOOD RIGHT HAND  Final   Special Requests   Final    BOTTLES DRAWN AEROBIC AND ANAEROBIC Blood Culture results may not be optimal due to an inadequate volume of blood received in culture bottles   Culture   Final    NO GROWTH 5 DAYS Performed at [facility], [place] [address], [place],  [address]    Report Status 08/02/2020 FINAL  Final     Labs: BNP (last 3 results) No results for input(s): BNP in the last 8760 hours. Basic Metabolic Panel: Recent Labs  Lab 07/29/20 0152 07/30/20 0212 07/31/20 0408 08/01/20 0349 08/02/20 0301  NA 140 140 140 138 140  K 4.4 4.0 3.4* 3.1* 3.6  CL 105 105 101 102 105  CO2 _0 GLUCOSE 128* 130* 138* 140* 216*  BUN _1 CREATININE 1.31* 1.33* 1.17* 1.16* 1.16*  CALCIUM 8.7* 8.7* 8.5* 8.5* 8.6*  MG 1.7 1.7 1.7 1.6* 2.1  PHOS 4.0 3.7 3.6 3.1 2.6   Liver Function Tests: Recent Labs  Lab 07/28/20 0150 07/29/20 0152 07/30/20 0212 08/02/20 0301  AST _2 --   ALT _3 --   ALKPHOS 91 69 76  --   BILITOT 0.4 0.2* 0.9  --   PROT 7.2 6.0* 6.2*  --   ALBUMIN 2.6* 2.1* 2.2* 2.2*   No results for input(s): LIPASE, AMYLASE in the last 168 hours. Recent Labs  Lab 07/28/20 0150  AMMONIA 18   CBC: Recent Labs  Lab 07/28/20 0150 07/28/20 0150 07/28/20 0527 07/29/20 0152 07/30/20 0212 07/31/20 0408 08/01/20 0349  WBC 14.5*   < > 12.0* 9.9 8.6 8.4 6.2  NEUTROABS 12.8*  --  10.2* 6.6 5.5  --   --   HGB 11.4*   < > 10.9* 11.1* 10.9* 10.8* 10.9*  HCT 35.8*   < > 34.2* 35.1* 33.8* 33.2* 33.5*  MCV 85.2   < > 86.8 87.1 84.3 84.7 84.6  PLT 506*   < > 476* 490* 481* 442* 450*   < > = values in this interval not displayed.   Cardiac Enzymes: No results for input(s): CKTOTAL, CKMB, CKMBINDEX, TROPONINI in the  last 168 hours. BNP: Invalid input(s): POCBNP CBG: Recent Labs  Lab 08/01/20 2036 08/01/20 2353 08/02/20 0446  08/02/20 0819 08/02/20 1245  GLUCAP 181* 192* 228* 171* 197*   D-Dimer No results for input(s): DDIMER in the last 72 hours. Hgb A1c No results for input(s): HGBA1C in the last 72 hours. Lipid Profile No results for input(s): CHOL, HDL, LDLCALC, TRIG, CHOLHDL, LDLDIRECT in the last 72 hours. Thyroid function studies No results for input(s): TSH, T4TOTAL, T3FREE, THYROIDAB in the last 72 hours.  Invalid input(s): FREET3 Anemia work up No results for input(s): VITAMINB12, FOLATE, FERRITIN, TIBC, IRON, RETICCTPCT in the last 72 hours. Urinalysis    Component Value Date/Time   COLORURINE YELLOW 07/28/2020 0300   APPEARANCEUR HAZY (A) 07/28/2020 0300   LABSPEC 1.020 07/28/2020 0300   PHURINE 7.0 07/28/2020 0300   GLUCOSEU >=500 (A) 07/28/2020 0300   GLUCOSEU NEGATIVE 05/21/2015 0831   HGBUR NEGATIVE 07/28/2020 0300   BILIRUBINUR NEGATIVE 07/28/2020 0300   KETONESUR NEGATIVE 07/28/2020 0300   PROTEINUR >=300 (A) 07/28/2020 0300   UROBILINOGEN 0.2 05/21/2015 0831   NITRITE NEGATIVE 07/28/2020 0300   LEUKOCYTESUR NEGATIVE 07/28/2020 0300   Sepsis Labs Invalid input(s): PROCALCITONIN,  WBC,  LACTICIDVEN   Time coordinating discharge: 40 minutes  SIGNED:  Mercy Riding, MD  [facility] 08/02/2020, 3:02 PM  If 7PM-7AM, please contact night-coverage [email or web address]

## 2020-08-02 NOTE — Progress Notes (Signed)
    [facility]   Date of Admission:  07/28/2020     Reason for visit: Follow up on concern for encephalitis  Interval History: HSV PCR from CSF negative   Recommendations: -- Low suspicion for infectious etiology -- Stop ACV (already done) -- Will sign off, please call as needed.    [facility] [phone] pager 08/02/2020, 9:36 AM

## 2020-08-02 NOTE — Progress Notes (Signed)
Patient [name] experiencing elevated BP of 212/101 at 2043. Patient states no symptoms.  Yellow MEWS protocol is currently in place. MD was notified and advised to give pt one time dose of Hydralazine 10mg . Avapro has been scheduled for 11/25 morning at 1000.  [facility] was also made aware of the patients MEWS status.   Q2 VS BP @ 2243 - 167/101 (No BP Medication was given yet) BP (Nov. 25th) @ 4801 -  139/85 (Hydralazine was given prior to) Midnight-scheduled Metoprolol was still given.

## 2020-08-02 NOTE — Discharge Instructions (Addendum)
Carbohydrate Counting for Diabetes Mellitus, Adult  Carbohydrate counting is a method of keeping track of how many carbohydrates you eat. Eating carbohydrates naturally increases the amount of sugar (glucose) in the blood. Counting how many carbohydrates you eat helps keep your blood glucose within normal limits, which helps you manage your diabetes (diabetes mellitus). It is important to know how many carbohydrates you can safely have in each meal. This is different for every person. A diet and nutrition specialist (registered dietitian) can help you make a meal plan and calculate how many carbohydrates you should have at each meal and snack. Carbohydrates are found in the following foods:  Grains, such as breads and cereals.  Dried beans and soy products.  Starchy vegetables, such as potatoes, peas, and corn.  Fruit and fruit juices.  Milk and yogurt.  Sweets and snack foods, such as cake, cookies, candy, chips, and soft drinks. How do I count carbohydrates? There are two ways to count carbohydrates in food. You can use either of the methods or a combination of both. Reading "Nutrition Facts" on packaged food The "Nutrition Facts" list is included on the labels of almost all packaged foods and beverages in the [place] It includes:  The serving size.  Information about nutrients in each serving, including the grams (g) of carbohydrate per serving. To use the Nutrition Facts":  Decide how many servings you will have.  Multiply the number of servings by the number of carbohydrates per serving.  The resulting number is the total amount of carbohydrates that you will be having. Learning standard serving sizes of other foods When you eat carbohydrate foods that are not packaged or do not include "Nutrition Facts" on the label, you need to measure the servings in order to count the amount of carbohydrates:  Measure the foods that you will eat with a food scale or measuring cup, if  needed.  Decide how many standard-size servings you will eat.  Multiply the number of servings by 15. Most carbohydrate-rich foods have about 15 g of carbohydrates per serving. ? For example, if you eat 8 oz (170 g) of strawberries, you will have eaten 2 servings and 30 g of carbohydrates (2 servings x 15 g = 30 g).  For foods that have more than one food mixed, such as soups and casseroles, you must count the carbohydrates in each food that is included. The following list contains standard serving sizes of common carbohydrate-rich foods. Each of these servings has about 15 g of carbohydrates:   hamburger bun or  English muffin.   oz (15 mL) syrup.   oz (14 g) jelly.  1 slice of bread.  1 six-inch tortilla.  3 oz (85 g) cooked rice or pasta.  4 oz (113 g) cooked dried beans.  4 oz (113 g) starchy vegetable, such as peas, corn, or potatoes.  4 oz (113 g) hot cereal.  4 oz (113 g) mashed potatoes or  of a large baked potato.  4 oz (113 g) canned or frozen fruit.  4 oz (120 mL) fruit juice.  4-6 crackers.  6 chicken nuggets.  6 oz (170 g) unsweetened dry cereal.  6 oz (170 g) plain fat-free yogurt or yogurt sweetened with artificial sweeteners.  8 oz (240 mL) milk.  8 oz (170 g) fresh fruit or one small piece of fruit.  24 oz (680 g) popped popcorn. Example of carbohydrate counting Sample meal  3 oz (85 g) chicken breast.  6 oz (170 g)  brown rice.  4 oz (113 g) corn.  8 oz (240 mL) milk.  8 oz (170 g) strawberries with sugar-free whipped topping. Carbohydrate calculation 1. Identify the foods that contain carbohydrates: ? Rice. ? Corn. ? Milk. ? Strawberries. 2. Calculate how many servings you have of each food: ? 2 servings rice. ? 1 serving corn. ? 1 serving milk. ? 1 serving strawberries. 3. Multiply each number of servings by 15 g: ? 2 servings rice x 15 g = 30 g. ? 1 serving corn x 15 g = 15 g. ? 1 serving milk x 15 g = 15 g. ? 1  serving strawberries x 15 g = 15 g. 4. Add together all of the amounts to find the total grams of carbohydrates eaten: ? 30 g + 15 g + 15 g + 15 g = 75 g of carbohydrates total. Summary  Carbohydrate counting is a method of keeping track of how many carbohydrates you eat.  Eating carbohydrates naturally increases the amount of sugar (glucose) in the blood.  Counting how many carbohydrates you eat helps keep your blood glucose within normal limits, which helps you manage your diabetes.  A diet and nutrition specialist (registered dietitian) can help you make a meal plan and calculate how many carbohydrates you should have at each meal and snack. This information is not intended to replace advice given to you by your health care provider. Make sure you discuss any questions you have with your health care provider. Document Revised: 03/19/2017 Document Reviewed: 02/06/2016 Elsevier Patient Education  2020 [place] After a Stroke After a stroke, you may have various problems with thinking (cognitive disability). The types of problems you have will depend on how severe the stroke was and where it was located in the brain. Problems may include:  Problems with short-term memory.  Trouble paying attention.  Trouble communicating or understanding language (aphasia).  A drop in mental ability that may interfere with daily life (dementia).  Trouble with problem-solving and information processing.  Problems with reading, writing, or math.  Problems with your ability to plan and to perform activities in sequence (executive function). These problems can feel overwhelming. However, with rehabilitation and time to heal, many people have improvement in their symptoms. What causes cognitive disability? A stroke happens when blood cannot flow to certain areas of the brain. When this happens, brain cells die in the affected areas because they cannot get oxygen and  nutrients from the blood. Cognitive disability is caused by the death of cells in the areas of the brain that control thinking. What is cognitive rehabilitation? Cognitive rehabilitation is a program to help you improve your thinking skills after a stroke. Rehabilitation cannot completely reverse the effects of a stroke, but it can help you with memory, problem-solving, and communication skills. Therapy focuses on:  Improving brain function. This may involve activities such as learning to break down tasks into simple steps.  Helping you learn ways to cope with thinking problems. For example, you might learn memory tricks or do activities that stimulate memory, such as naming objects or describing pictures. Cognitive rehabilitation may include:  Speech-language therapy to help you understand and use language to communicate.  Occupational therapy to help you perform daily activities.  Music therapy to help relieve stress, anxiety, and depression. This may involve listening to music, singing, or playing instruments.  Physical therapy to help improve your ability to move and perform actions that involve the muscles (motor  functions). When will therapy start and where will I have therapy? Your health care provider will decide when it is best for you to start therapy. In some cases, people start rehabilitation as soon as their health is stable, which may be 24-48 hours after the stroke. Rehabilitation can take place in a few different places, based on your needs. It may take place in:  The hospital or an in-patient rehabilitation hospital.  An outpatient rehabilitation facility.  A long-term care facility.  A community rehabilitation clinic.  Your home. What are some tools to help after a stroke? There are a number of tools and apps that you can use on your smartphone, personal computer, or tablet to help improve brain function. Some of these apps include:  Calendar reminders or alarm apps  to help with memory.  Note-taking or sketch pad apps to help with memory or communication.  Text-to-speech apps that allow you to listen to what you are reading, which helps your ability to understanding text.  Picture dictionary or picture message apps to help with communication.  E-readers. These can highlight text as it is read aloud, which helps with listening and reading skills. How can my friends or family help during my rehabilitation? During your recovery, it is important that your friends and family members help you work toward more independence. Your caregivers should speak with your health care providers to learn how they can best help you during recovery. This may include working on speech-language or memory exercises at home, or helping with daily tasks and errands. If you have cognitive disability, you may be at risk for injury or accidents at home, such as forgetting to turn off the stove. Friends and family members can help ensure home safety by taking steps such as getting appliances with automatic shut-off features or storing dangerous objects in a secure place. What else should I know about cognitive rehabilitation after a stroke? Having trouble with memory and problem-solving can make you feel alone. You may also have mood changes, anxiety, or depression after a stroke. It is important to:  Stay connected with others through social groups, online support groups, or your community.  Talk to your friends, family, and caregivers about any emotional problems you are having.  Go to one-on-one or group therapy as suggested by your health care provider.  Stay physically active and exercise as often as suggested by your health care provider. Summary  After a stroke, some people have problems with thinking that affect attention, memory, language, communication, and problem-solving.  Cognitive rehabilitation is a program to help you regain brain function and learn skills to cope  with thinking problems.  Rehabilitation cannot completely reverse the effects of a stroke, but it can help to improve quality of life.  Cognitive rehabilitation may include speech-language therapy, occupational therapy, music therapy, and physical therapy. This information is not intended to replace advice given to you by your health care provider. Make sure you discuss any questions you have with your health care provider. Document Revised: 12/15/2018 Document Reviewed: 11/28/2016 Elsevier Patient Education  [place].   Diabetes Basics  Diabetes (diabetes mellitus) is a long-term (chronic) disease. It occurs when the body does not properly use sugar (glucose) that is released from food after you eat. Diabetes may be caused by one or both of these problems:  Your pancreas does not make enough of a hormone called insulin.  Your body does not react in a normal way to insulin that it makes. Insulin  lets sugars (glucose) go into cells in your body. This gives you energy. If you have diabetes, sugars cannot get into cells. This causes high blood sugar (hyperglycemia). Follow these instructions at home: How is diabetes treated? You may need to take insulin or other diabetes medicines daily to keep your blood sugar in balance. Take your diabetes medicines every day as told by your doctor. List your diabetes medicines here: Diabetes medicines  Name of medicine: ______________________________ ? Amount (dose): _______________ Time (a.m./p.m.): _______________ Notes: ___________________________________  Name of medicine: ______________________________ ? Amount (dose): _______________ Time (a.m./p.m.): _______________ Notes: ___________________________________  Name of medicine: ______________________________ ? Amount (dose): _______________ Time (a.m./p.m.): _______________ Notes: ___________________________________ If you use insulin, you will learn how to give yourself insulin by  injection. You may need to adjust the amount based on the food that you eat. List the types of insulin you use here: Insulin  Insulin type: ______________________________ ? Amount (dose): _______________ Time (a.m./p.m.): _______________ Notes: ___________________________________  Insulin type: ______________________________ ? Amount (dose): _______________ Time (a.m./p.m.): _______________ Notes: ___________________________________  Insulin type: ______________________________ ? Amount (dose): _______________ Time (a.m./p.m.): _______________ Notes: ___________________________________  Insulin type: ______________________________ ? Amount (dose): _______________ Time (a.m./p.m.): _______________ Notes: ___________________________________  Insulin type: ______________________________ ? Amount (dose): _______________ Time (a.m./p.m.): _______________ Notes: ___________________________________ How do I manage my blood sugar?  Check your blood sugar levels using a blood glucose monitor as directed by your doctor. Your doctor will set treatment goals for you. Generally, you should have these blood sugar levels:  Before meals (preprandial): 80-130 mg/dL (4.4-7.2 mmol/L).  After meals (postprandial): below 180 mg/dL (10 mmol/L).  A1c level: less than 7%. Write down the times that you will check your blood sugar levels: Blood sugar checks  Time: _______________ Notes: ___________________________________  Time: _______________ Notes: ___________________________________  Time: _______________ Notes: ___________________________________  Time: _______________ Notes: ___________________________________  Time: _______________ Notes: ___________________________________  Time: _______________ Notes: ___________________________________  What do I need to know about low blood sugar? Low blood sugar is called hypoglycemia. This is when blood sugar is at or below 70 mg/dL (3.9 mmol/L).  Symptoms may include:  Feeling: ? Hungry. ? Worried or nervous (anxious). ? Sweaty and clammy. ? Confused. ? Dizzy. ? Sleepy. ? Sick to your stomach (nauseous).  Having: ? A fast heartbeat. ? A headache. ? A change in your vision. ? Tingling or no feeling (numbness) around the mouth, lips, or tongue. ? Jerky movements that you cannot control (seizure).  Having trouble with: ? Moving (coordination). ? Sleeping. ? Passing out (fainting). ? Getting upset easily (irritability). Treating low blood sugar To treat low blood sugar, eat or drink something sugary right away. If you can think clearly and swallow safely, follow the 15:15 rule:  Take 15 grams of a fast-acting carb (carbohydrate). Talk with your doctor about how much you should take.  Some fast-acting carbs are: ? Sugar tablets (glucose pills). Take 3-4 glucose pills. ? 6-8 pieces of hard candy. ? 4-6 oz (120-150 mL) of fruit juice. ? 4-6 oz (120-150 mL) of regular (not diet) soda. ? 1 Tbsp (15 mL) honey or sugar.  Check your blood sugar 15 minutes after you take the carb.  If your blood sugar is still at or below 70 mg/dL (3.9 mmol/L), take 15 grams of a carb again.  If your blood sugar does not go above 70 mg/dL (3.9 mmol/L) after 3 tries, get help right away.  After your blood sugar goes back to normal, eat a meal or a snack within 1  hour. Treating very low blood sugar If your blood sugar is at or below 54 mg/dL (3 mmol/L), you have very low blood sugar (severe hypoglycemia). This is an emergency. Do not wait to see if the symptoms will go away. Get medical help right away. Call your local emergency services (911 in the [place] Do not drive yourself to the hospital. Questions to ask your health care provider  Do I need to meet with a diabetes educator?  What equipment will I need to care for myself at home?  What diabetes medicines do I need? When should I take them?  How often do I need to check my blood  sugar?  What number can I call if I have questions?  When is my next doctor's visit?  Where can I find a support group for people with diabetes? Where to find more information  American Diabetes Association: www.diabetes.org  American Association of Diabetes Educators: www.diabeteseducator.org/patient-resources Contact a doctor if:  Your blood sugar is at or above 240 mg/dL (13.3 mmol/L) for 2 days in a row.  You have been sick or have had a fever for 2 days or more, and you are not getting better.  You have any of these problems for more than 6 hours: ? You cannot eat or drink. ? You feel sick to your stomach (nauseous). ? You throw up (vomit). ? You have watery poop (diarrhea). Get help right away if:  Your blood sugar is lower than 54 mg/dL (3 mmol/L).  You get confused.  You have trouble: ? Thinking clearly. ? Breathing. Summary  Diabetes (diabetes mellitus) is a long-term (chronic) disease. It occurs when the body does not properly use sugar (glucose) that is released from food after digestion.  Take insulin and diabetes medicines as told.  Check your blood sugar every day, as often as told.  Keep all follow-up visits as told by your doctor. This is important. This information is not intended to replace advice given to you by your health care provider. Make sure you discuss any questions you have with your health care provider. Document Revised: 05/18/2019 Document Reviewed: 11/27/2017 Elsevier Patient Education  [place].   Alteplase Treatment for Ischemic Stroke  An ischemic stroke is caused by blood clots that block blood flow to the brain. Alteplase is a medicine that can dissolve these blood clots. It can help treat a stroke if it is given very shortly after stroke symptoms begin. It is most effective when it is used within 0-4 hours after the start of symptoms. Before giving this treatment, the health care provider will carefully consider whether  the treatment is appropriate based on your age, condition, and other factors. Tell a health care provider about:  Any allergies you have.  All medicines you are taking, including blood thinners, vitamins, herbs, and over-the-counter medicines.  Any medical conditions you have.  Any blood disorders you have.  Any active bleeding in the last 21 days, including gastrointestinal (GI) or vaginal bleeding.  Any surgeries or procedures you have had.  Whether you are pregnant or may be pregnant.  When your stroke symptoms started. What are the risks? Generally, this is a safe treatment. However, problems may occur, including:  Bleeding into the brain.  Bleeding in other parts of the body.  Allergic reaction. What happens before the treatment?  Your health care provider will perform a physical exam and take a detailed medical history.  Your temperature, blood pressure, heart rate, and breathing rate (vital  signs) will be monitored closely. If needed, you may be given medicines to adjust your blood pressure.  You may have tests, including blood tests or imaging studies, such as a CT scan. What happens during treatment?  An IV will be inserted into one of your veins.  Alteplase will be given to you through this IV, usually over the course of 1 hour.  In some cases, this medicine may be given directly into the affected artery through a thin tube (catheter). This is usually inserted in the artery at the top of your leg.  Your vital signs and brain function will be monitored closely as you receive this medicine.  If bleeding occurs, the medicine will be stopped and appropriate therapy will be started. The procedure may vary among health care providers and hospitals. What can I expect after treatment?  You will be monitored frequently by your health care team in an intensive care unit or a stroke unit.  You will be checked by a speech therapist, physical therapist, and an  occupational therapist.  If you had a catheter, you may have bruising, soreness, and swelling at the catheter insertion site.  It may take several days, weeks, or even months to fully determine how well you responded to the alteplase treatment. Follow these instructions in the hospital: Activity  Do not get out of bed without help. This is for your safety.  Limit activity after your treatment.  Participate in stroke rehabilitation programs as told by your health care provider. General instructions  Take over-the-counter and prescription medicines only as told by your health care provider.  Tell a nurse or health care provider right away if you have any bleeding, bruising or injuries.  Use a soft-bristled toothbrush. Brush your teeth gently. Get help right away if you have:  Blood in your vomit, stool, or urine.  A serious fall or accident, or you hit your head.  Symptoms of an allergic reaction, such as a rash or difficulty breathing.  Any symptoms of a stroke. "BE FAST" is an easy way to remember the main warning signs: ? B - Balance. Signs are dizziness, sudden trouble walking, or loss of balance. ? E - Eyes. Signs are trouble seeing or a sudden change in vision. ? F - Face. Signs are sudden weakness or numbness of the face, or the face or eyelid drooping on one side. ? A - Arms. Signs are weakness or numbness in an arm. This happens suddenly and usually on one side of the body. ? S - Speech. Signs are sudden trouble speaking, slurred speech, or trouble understanding what people say. ? T - Time. Time to call emergency services. Write down what time symptoms started.  Other signs of stroke, such as: ? A sudden, very bad headache with no known cause. ? Nausea or vomiting. ? Seizure. These symptoms may represent a serious problem that is an emergency. Do not wait to see if the symptoms will go away. Get medical help right away. Call your local emergency services (911 in the  [place] Do not drive yourself to the hospital. Summary  Alteplase is a medicine that can dissolve blood clots. It can be used to open blocked arteries during an ischemic stroke.  To be effective, this medicine must be given very shortly after stroke symptoms begin, within 4 hours after the start of symptoms.  You will be monitored closely by your health care team during and after receiving alteplase.  Get help right away if you  are showing signs of bleeding or are having symptoms of stroke. This information is not intended to replace advice given to you by your health care provider. Make sure you discuss any questions you have with your health care provider. Document Revised: 03/04/2019 Document Reviewed: 03/04/2019 Elsevier Patient Education  [place] for Ischemic Stroke  Alteplase is a medicine that can dissolve blood clots. An ischemic stroke is caused by clots that block blood flow to the brain. Alteplase can help treat a stroke if it is given very shortly after stroke symptoms begin. Before giving this medicine, your doctor will decide if the medicine may work for you based on:  Your age.  Your condition.  Other factors. Tell your doctor about:  Any allergies you have.  All medicines you are taking.  Any medical conditions you have.  Any blood disorders you have.  Any bleeding in the last 21 days, including bleeding in the stomach or the vagina.  Any surgeries you have had.  Whether you are pregnant or may be pregnant.  The time your symptoms started. What are the risks? Generally, this is a safe treatment. However, problems may happen, including:  Bleeding in the brain.  Bleeding in other parts of the body.  Allergic reactions to the medicine. What happens before the procedure?  Your doctor will do an exam.  Your doctor will check your body temperature, blood pressure, heart rate, and breathing.  Medicines may be given to  adjust your blood pressure, if needed.  You may have tests, such as: ? Blood tests. ? A head scan (CT scan). What happens during the procedure?  An IV will be put into one of your veins.  Alteplase will be given to you through the IV.  In some cases, this medicine may be given directly to the affected area through a thin tube (catheter). This is usually put in at the top of your leg.  Your health care team will closely watch your: ? Blood pressure. ? Heart rate. ? Breathing.  Your doctor will check often to see how well the medicine is working.  If the medicine causes bleeding, it will be stopped. Another treatment will be started. What can I expect after the treatment?  You will be watched closely in the ICU or the stroke unit.  You will be checked by several specialists, including speech therapists, physical therapists, and occupational therapists.  If you had a catheter, you may have: ? Bruising. ? Soreness. ? Swelling.  It may take days, weeks, or months to fully see how well your body responded to the treatment. Follow these instructions in the hospital: Activity  Do not get out of bed without help. This is for your safety.  Limit activity after your treatment.  Do stroke rehab programs as told by your doctor. General instructions  Take over-the-counter and prescription medicines only as told by your doctor.  Tell a nurse or doctor right away if you have any bleeding, bruising, or injuries.  Use a soft-bristled toothbrush. Brush your teeth gently. Get help right away if you have:  Blood in your vomit, stool, or urine.  A serious fall or accident, or you hit your head.  Symptoms of an allergic reaction, such as rash or difficulty breathing.  Any signs of a stroke. "BE FAST" is an easy way to remember the main warning signs: ? B - Balance. Signs are dizziness, sudden trouble walking, or loss of balance. ? E -  Eyes. Signs are trouble seeing or a sudden  change in how you see. ? F - Face. Signs are sudden weakness or loss of feeling in the face, or the face or eyelid drooping on one side. ? A - Arms. Signs are weakness or loss of feeling in an arm. This happens suddenly and usually on one side of the body. ? S - Speech. Signs are sudden trouble speaking, slurred speech, or trouble understanding what people say. ? T - Time. Time to call emergency services. Write down what time symptoms started. ? Other signs of a stroke. This may include:  A sudden, very bad headache with no known cause.  Feeling like you may vomit (nausea).  Vomiting.  A seizure. These symptoms may be an emergency. Do not wait to see if the symptoms will go away. Get medical help right away. Call your local emergency services (911 in the [place] Do not drive yourself to the hospital. Summary  Alteplase is a medicine that can break up blood clots. Blood clots can cause a stroke.  This medicine may help if you get it as soon as possible after your stroke symptoms start.  You will be watched closely in the ICU or the stroke unit.  Get help right away if you are showing signs of increased bleeding or are having symptoms of stroke. This information is not intended to replace advice given to you by your health care provider. Make sure you discuss any questions you have with your health care provider. Document Revised: 03/04/2019 Document Reviewed: 03/04/2019 Elsevier Patient Education  [place].   Complementary and Alternative Medical Therapies for Diabetes Complementary and alternative medical therapies are treatments that are different from typical medical treatments (Western treatments). "Complementary" means that the therapy is used with Western treatments. "Alternative" means that the therapy is used instead of Western treatments. Are these therapies safe? Some of these therapies are usually safe. Others may be harmful. Often, there is not enough research to  show how safe or effective a therapy is. If you want to try a complementary or alternative therapy, talk with your health care provider to make sure it is safe. What alternative or complementary therapies are used to treat diabetes? Acupuncture  Acupuncture is the insertion of needles into certain places on the skin. This is done by a professional. It is often used to relieve long-term (chronic) pain, especially of the bones and joints. It may help you if you have painful nerve damage. Biofeedback Biofeedback helps you to become more aware of your body's response to pain. It also helps you to learn ways of dealing with pain. Biofeedback is about relaxing and reducing stress. An example of a biofeedback technique is guided imagery. This involves creating peaceful images in your mind. Chromium Chromium is a substance that can help improve how insulin works in the body. Chromium is in many foods, including whole grains, nuts, and egg yolks. Chromium may also be taken as a supplement. Taking chromium supplements may help to control diabetes, especially if you have a lack of chromium (deficiency) in your body. However, research has not proven this. If you have kidney problems, you should be careful with chromium supplements. American ginseng American ginseng is an herb that may lower glucose levels. It may also help lower A1C levels. More research is needed before recommendations for ginseng use can be made. Magnesium Magnesium is a mineral found in many foods, such as whole grains, nuts, and green leafy vegetables. Having  low magnesium levels may make controlling blood glucose more difficult for people who have type 2 diabetes. Low magnesium levels may also contribute to certain diabetes complications. Getting more magnesium and eating a high-fiber diet may reduce the risk of developing type 2 diabetes. Vanadium Vanadium is a compound found in small amounts in certain plants and animals. Some studies show  that it improves glucose levels in animals with diabetes. In one study, people with diabetes were able to lower their insulin dosage when taking vanadium. More research about side effects and safe dosage levels is needed. Cinnamon Cinnamon may decrease insulin resistance, increase insulin production, and lower blood glucose levels. It may work best when used with diabetes medicines. Fenugreek Fenugreek is an herb whose seeds are often used in cooking. It may help lower blood glucose by decreasing carbohydrate absorption and increasing insulin production. Summary  Talk with your health care provider about complementary or alternative therapy for you. Some therapies may be appropriate for you, but others may cause side effects.  Follow your diabetes care plan as prescribed. This information is not intended to replace advice given to you by your health care provider. Make sure you discuss any questions you have with your health care provider. Document Revised: 05/18/2019 Document Reviewed: 09/10/2016 Elsevier Patient Education  [place].  Clopidogrel tablets What is this medicine? CLOPIDOGREL (kloh PID oh grel) helps to prevent blood clots. This medicine is used to prevent heart attack, stroke, or other vascular events in people who are at high risk. This medicine may be used for other purposes; ask your health care provider or pharmacist if you have questions. COMMON BRAND NAME(S): Plavix What should I tell my health care provider before I take this medicine? They need to know if you have any of the following conditions:  bleeding disorders  bleeding in the brain  having surgery  history of stomach bleeding  an unusual or allergic reaction to clopidogrel, other medicines, foods, dyes, or preservatives  pregnant or trying to get pregnant  breast-feeding How should I use this medicine? Take this medicine by mouth with a glass of water. Follow the directions on the  prescription label. You may take this medicine with or without food. If it upsets your stomach, take it with food. Take your medicine at regular intervals. Do not take it more often than directed. Do not stop taking except on your doctor's advice. A special MedGuide will be given to you by the pharmacist with each prescription and refill. Be sure to read this information carefully each time. Talk to your pediatrician regarding the use of this medicine in children. Special care may be needed. Overdosage: If you think you have taken too much of this medicine contact a poison control center or emergency room at once. NOTE: This medicine is only for you. Do not share this medicine with others. What if I miss a dose? If you miss a dose, take it as soon as you can. If it is almost time for your next dose, take only that dose. Do not take double or extra doses. What may interact with this medicine? Do not take this medicine with the following medications:  dasabuvir; ombitasvir; paritaprevir; ritonavir  defibrotide  selexipag This medicine may also interact with the following medications:  certain medicines that treat or prevent blood clots like warfarin  narcotic medicines for pain  NSAIDs, medicines for pain and inflammation, like ibuprofen or naproxen  repaglinide  SNRIs, medicines for depression, like desvenlafaxine, duloxetine,  levomilnacipran, venlafaxine  SSRIs, medicines for depression, like citalopram, escitalopram, fluoxetine, fluvoxamine, paroxetine, sertraline  stomach acid blockers like cimetidine, esomeprazole, omeprazole This list may not describe all possible interactions. Give your health care provider a list of all the medicines, herbs, non-prescription drugs, or dietary supplements you use. Also tell them if you smoke, drink alcohol, or use illegal drugs. Some items may interact with your medicine. What should I watch for while using this medicine? Visit your doctor or  health care professional for regular check-ups. Do not stop taking your medicine unless your doctor tells you to. Notify your doctor or health care professional and seek emergency treatment if you develop breathing problems; changes in vision; chest pain; severe, sudden headache; pain, swelling, warmth in the leg; trouble speaking; sudden numbness or weakness of the face, arm or leg. These can be signs that your condition has gotten worse. If you are going to have surgery or dental work, tell your doctor or health care professional that you are taking this medicine. Certain genetic factors may reduce the effect of this medicine. Your doctor may use genetic tests to determine treatment. Only take aspirin if you are instructed to. Low doses of aspirin are used with this medicine to treat some conditions. Taking aspirin with this medicine can increase your risk of bleeding so you must be careful. Talk to your doctor or pharmacist if you have questions. What side effects may I notice from receiving this medicine? Side effects that you should report to your doctor or health care professional as soon as possible:  allergic reactions like skin rash, itching or hives, swelling of the face, lips, or tongue  signs and symptoms of bleeding such as bloody or black, tarry stools; red or dark-brown urine; spitting up blood or brown material that looks like coffee grounds; red spots on the skin; unusual bruising or bleeding from the eye, gums, or nose  signs and symptoms of a blood clot such as breathing problems; changes in vision; chest pain; severe, sudden headache; pain, swelling, warmth in the leg; trouble speaking; sudden numbness or weakness of the face, arm or leg  signs and symptoms of low blood sugar such as feeling anxious; confusion; dizziness; increased hunger; unusually weak or tired; increased sweating; shakiness; cold, clammy skin; irritable; headache; blurred vision; fast heartbeat; loss of  consciousness Side effects that usually do not require medical attention (report to your doctor or health care professional if they continue or are bothersome):  constipation  diarrhea  headache  upset stomach This list may not describe all possible side effects. Call your doctor for medical advice about side effects. You may report side effects to FDA at 1-800-FDA-1088. Where should I keep my medicine? Keep out of the reach of children. Store at room temperature of 59 to 86 degrees F (15 to 30 degrees C). Throw away any unused medicine after the expiration date. NOTE: This sheet is a summary. It may not cover all possible information. If you have questions about this medicine, talk to your doctor, pharmacist, or health care provider.

## 2020-08-02 NOTE — TOC Progression Note (Addendum)
Transition of Care [facility]) - Progression Note    Patient Details  Name: [name] MRN: [id number] Date of Birth: 12-25-1965  Transition of [facility]) CM/SW [place], RN Phone Number: 08/02/2020, 1:21 PM  Clinical Narrative:    Wheelchair ordered for patient. Faxed to [employer] for sending out to house. ETA Friday or Saturday. RN wil let family know so they are aware.   Expected Discharge Plan: [place] Barriers to Discharge: Continued Medical Work up  Expected Discharge Plan and Services Expected Discharge Plan: [place]   Discharge Planning Services: CM Consult Post Acute Care Choice: Home Health, Durable Medical Equipment Living arrangements for the past 2 months: Single Family Home Expected Discharge Date: 08/02/20               DME Arranged: Wheelchair manual DME Agency: [employer] Date DME Agency Contacted: 07/31/20   Representative spoke with at DME Agency: Freda Munro [facility] Arranged: PT, OT           Social Determinants of Health ([place]) Interventions    Readmission Risk Interventions Readmission Risk Prevention Plan 12/14/2018 07/06/2018  Transportation Screening Complete Complete  PCP or Specialist Appt within 5-7 Days - Complete  PCP or Specialist Appt within 3-5 Days Complete -  Home Care Screening - Complete  Medication Review (RN CM) - Complete  HRI or Home Care Consult Complete -  Social Work Consult for [place] Planning/Counseling Complete -  Palliative Care Screening Not Applicable -  Some recent data might be hidden

## 2020-08-03 LAB — HSV TYPE 2 AB IGG, CSF (REFLEXED): HSV Type 2 Ab IgG, CSF (Reflexed): 0 IV (ref <=0.89)

## 2020-08-06 LAB — HSV 1/2 AB IGG/IGM CSF
HSV 1/2 Ab Screen IgG, CSF: 2.84 IV — ABNORMAL HIGH
HSV 1/2 Ab, IgM, CSF: 0.17 IV

## 2020-08-06 LAB — HSV TYPE 1 AB, IGG, CSF (REFLEXED): HSV Type 1 Ab, IgG, CSF (Reflexed): 2.15 IV — ABNORMAL HIGH (ref <=0.89)

## 2020-08-09 ENCOUNTER — Telehealth: Payer: Self-pay

## 2020-08-09 NOTE — Telephone Encounter (Signed)
[name] patients home health aid called she stated patient is requesting rx refill for oxycodone. au

## 2020-08-09 NOTE — Telephone Encounter (Signed)
Pt is requesting refill on Oxycodone revision left [facility] 11/2019

## 2020-08-10 MED ORDER — HYDROCODONE-ACETAMINOPHEN 5-325 MG PO TABS
1.0000 | ORAL_TABLET | Freq: Two times a day (BID) | ORAL | 0 refills | Status: DC | PRN
Start: 2020-08-10 — End: 2020-12-25

## 2020-08-10 NOTE — Addendum Note (Signed)
Addended by: Suzan Slick on: 08/10/2020 08:18 AM   Modules accepted: Orders

## 2020-08-16 ENCOUNTER — Ambulatory Visit: Payer: BC Managed Care – PPO | Admitting: Orthopedic Surgery

## 2020-09-27 ENCOUNTER — Telehealth (INDEPENDENT_AMBULATORY_CARE_PROVIDER_SITE_OTHER): Payer: BC Managed Care – PPO | Admitting: Family

## 2020-09-27 ENCOUNTER — Other Ambulatory Visit: Payer: Self-pay

## 2020-09-27 DIAGNOSIS — R110 Nausea: Secondary | ICD-10-CM

## 2020-09-27 DIAGNOSIS — E1159 Type 2 diabetes mellitus with other circulatory complications: Secondary | ICD-10-CM | POA: Diagnosis not present

## 2020-09-27 MED ORDER — ONDANSETRON HCL 4 MG PO TABS: 4.0000 mg | ORAL_TABLET | Freq: Three times a day (TID) | ORAL | 0 refills | Status: AC | PRN

## 2020-09-27 NOTE — Progress Notes (Signed)
Virtual Visit via Telephone Note  I connected with [name] on 09/27/20 at  1:20 PM EST by telephone and verified that I am speaking with the correct person using two identifiers.  Location: Patient: home Provider: work   I discussed the limitations, risks, security and privacy concerns of performing an evaluation and management service by telephone and the availability of in person appointments. I also discussed with the patient that there may be a patient responsible charge related to this service. The patient expressed understanding and agreed to proceed.   History of Present Illness:  Patient is a 55 yr old female who presents today for telephone visit. History is provided by her paid caregiver due to pt's dysarthria. Patient was hospitalized 11/20-11/25 due to CVA.  She was discharged home with Home health PT/OT. Caregiver reports that pt developed nausea 3 days ago.  She is tolerating liquids. Not taking in solids.  BM's have been normal and are formed.  Denies abdominal pain.  Denies vomiting.    Observations/Objective:  Dysarthric  Assessment and Plan:  Nausea- etiology is unclear.  Advised caregiver as follows:    Begin zofran as needed.   Restart medications- ok to take with full liquids.   Go to ER if pt develops abdominal pain, inability to keep down fluids, worsening nausea, or fever.   Follow up on Tuesday for lab work and formal hospital follow up.   DM2- caregiver reports sugars have been running 150-250 since she has been sick which "is good for her."  Monitor.   Follow Up Instructions:    I discussed the assessment and treatment plan with the patient. The patient was provided an opportunity to ask questions and all were answered. The patient agreed with the plan and demonstrated an understanding of the instructions.   The patient was advised to call back or seek an in-person evaluation if the symptoms worsen or if the condition fails to improve as  anticipated.  I provided 15 minutes of non-face-to-face time during this encounter.   Nance Pear, NP

## 2020-09-30 ENCOUNTER — Other Ambulatory Visit: Payer: Self-pay | Admitting: Medical

## 2020-10-02 ENCOUNTER — Inpatient Hospital Stay: Payer: BC Managed Care – PPO | Admitting: Family

## 2020-10-14 ENCOUNTER — Other Ambulatory Visit: Payer: Self-pay | Admitting: Medical

## 2020-10-14 DIAGNOSIS — I10 Essential (primary) hypertension: Secondary | ICD-10-CM

## 2020-12-25 ENCOUNTER — Encounter: Payer: Self-pay | Admitting: Family Medicine

## 2020-12-25 ENCOUNTER — Ambulatory Visit (INDEPENDENT_AMBULATORY_CARE_PROVIDER_SITE_OTHER): Payer: BC Managed Care – PPO | Admitting: Family Medicine

## 2020-12-25 ENCOUNTER — Ambulatory Visit (HOSPITAL_BASED_OUTPATIENT_CLINIC_OR_DEPARTMENT_OTHER)
Admission: RE | Admit: 2020-12-25 | Discharge: 2020-12-25 | Disposition: A | Discharge status: Home / Self Care | Payer: BC Managed Care – PPO | Source: Ambulatory Visit | Attending: Family Medicine | Admitting: Family Medicine

## 2020-12-25 ENCOUNTER — Other Ambulatory Visit: Payer: Self-pay

## 2020-12-25 VITALS — BP 128/82 | HR 87 | Temp 98.0°F | Resp 16 | Wt 158.8 lb

## 2020-12-25 DIAGNOSIS — E039 Hypothyroidism, unspecified: Secondary | ICD-10-CM | POA: Diagnosis not present

## 2020-12-25 DIAGNOSIS — N183 Chronic kidney disease, stage 3 unspecified: Secondary | ICD-10-CM

## 2020-12-25 DIAGNOSIS — I639 Cerebral infarction, unspecified: Secondary | ICD-10-CM | POA: Diagnosis not present

## 2020-12-25 DIAGNOSIS — R1310 Dysphagia, unspecified: Secondary | ICD-10-CM

## 2020-12-25 DIAGNOSIS — Z89511 Acquired absence of right leg below knee: Secondary | ICD-10-CM

## 2020-12-25 DIAGNOSIS — D638 Anemia in other chronic diseases classified elsewhere: Secondary | ICD-10-CM | POA: Diagnosis not present

## 2020-12-25 DIAGNOSIS — H547 Unspecified visual loss: Secondary | ICD-10-CM

## 2020-12-25 DIAGNOSIS — I10 Essential (primary) hypertension: Secondary | ICD-10-CM

## 2020-12-25 DIAGNOSIS — E785 Hyperlipidemia, unspecified: Secondary | ICD-10-CM

## 2020-12-25 DIAGNOSIS — M25511 Pain in right shoulder: Secondary | ICD-10-CM | POA: Insufficient documentation

## 2020-12-25 DIAGNOSIS — Z89512 Acquired absence of left leg below knee: Secondary | ICD-10-CM

## 2020-12-25 DIAGNOSIS — Z951 Presence of aortocoronary bypass graft: Secondary | ICD-10-CM | POA: Diagnosis not present

## 2020-12-25 DIAGNOSIS — E1159 Type 2 diabetes mellitus with other circulatory complications: Secondary | ICD-10-CM | POA: Diagnosis not present

## 2020-12-25 DIAGNOSIS — F801 Expressive language disorder: Secondary | ICD-10-CM

## 2020-12-25 DIAGNOSIS — E43 Unspecified severe protein-calorie malnutrition: Secondary | ICD-10-CM

## 2020-12-25 LAB — LIPID PANEL
Cholesterol: 270 mg/dL — ABNORMAL HIGH (ref 0–200)
HDL: 37.7 mg/dL — ABNORMAL LOW (ref >39.00)
LDL Cholesterol: 196 mg/dL — ABNORMAL HIGH (ref 0–99)
NonHDL: 232.73
Total CHOL/HDL Ratio: 7
Triglycerides: 185 mg/dL — ABNORMAL HIGH (ref 0.0–149.0)
VLDL: 37 mg/dL (ref 0.0–40.0)

## 2020-12-25 LAB — COMPREHENSIVE METABOLIC PANEL
ALT: 8 U/L (ref 0–35)
AST: 11 U/L (ref 0–37)
Albumin: 3.1 g/dL — ABNORMAL LOW (ref 3.5–5.2)
Alkaline Phosphatase: 93 U/L (ref 39–117)
BUN: 18 mg/dL (ref 6–23)
CO2: 32 mEq/L (ref 19–32)
Calcium: 9.2 mg/dL (ref 8.4–10.5)
Chloride: 100 mEq/L (ref 96–112)
Creatinine, Ser: 1.03 mg/dL (ref 0.40–1.20)
GFR: 60.56 mL/min (ref >60.00)
Glucose, Bld: 158 mg/dL — ABNORMAL HIGH (ref 70–99)
Potassium: 3.7 mEq/L (ref 3.5–5.1)
Sodium: 142 mEq/L (ref 135–145)
Total Bilirubin: 0.5 mg/dL (ref 0.2–1.2)
Total Protein: 6.9 g/dL (ref 6.0–8.3)

## 2020-12-25 LAB — CBC
HCT: 33.8 % — ABNORMAL LOW (ref 36.0–46.0)
Hemoglobin: 10.9 g/dL — ABNORMAL LOW (ref 12.0–15.0)
MCHC: 32.4 g/dL (ref 30.0–36.0)
MCV: 84 fl (ref 78.0–100.0)
Platelets: 613 10*3/uL — ABNORMAL HIGH (ref 150.0–400.0)
RBC: 4.02 Mil/uL (ref 3.87–5.11)
RDW: 15.1 % (ref 11.5–15.5)
WBC: 8.7 10*3/uL (ref 4.0–10.5)

## 2020-12-25 LAB — HEMOGLOBIN A1C: Hgb A1c MFr Bld: 8 % — ABNORMAL HIGH (ref 4.6–6.5)

## 2020-12-25 LAB — TSH: TSH: 2.3 u[IU]/mL (ref 0.35–4.50)

## 2020-12-25 NOTE — Patient Instructions (Addendum)
  Repeat MRI of brain?  Shoulder Pain Many things can cause shoulder pain, including:  An injury to the shoulder.  Overuse of the shoulder.  Arthritis. The source of the pain can be:  Inflammation.  An injury to the shoulder joint.  An injury to a tendon, ligament, or bone. Follow these instructions at home: Pay attention to changes in your symptoms. Let your health care provider know about them. Follow these instructions to relieve your pain. If you have a sling:  Wear the sling as told by your health care provider. Remove it only as told by your health care provider.  Loosen the sling if your fingers tingle, become numb, or turn cold and blue.  Keep the sling clean.  If the sling is not waterproof: ? Do not let it get wet. Remove it to shower or bathe.  Move your arm as little as possible, but keep your hand moving to prevent swelling. Managing pain, stiffness, and swelling  If directed, put ice on the painful area: ? Put ice in a plastic bag. ? Place a towel between your skin and the bag. ? Leave the ice on for 20 minutes, 2-3 times per day. Stop applying ice if it does not help with the pain.  Squeeze a soft ball or a foam pad as much as possible. This helps to keep the shoulder from swelling. It also helps to strengthen the arm.   General instructions  Take over-the-counter and prescription medicines only as told by your health care provider.  Keep all follow-up visits as told by your health care provider. This is important. Contact a health care provider if:  Your pain gets worse.  Your pain is not relieved with medicines.  New pain develops in your arm, hand, or fingers. Get help right away if:  Your arm, hand, or fingers: ? Tingle. ? Become numb. ? Become swollen. ? Become painful. ? Turn white or blue. Summary  Shoulder pain can be caused by an injury, overuse, or arthritis.  Pay attention to changes in your symptoms. Let your health care  provider know about them.  This condition may be treated with a sling, ice, and pain medicines.  Contact your health care provider if the pain gets worse or new pain develops. Get help right away if your arm, hand, or fingers tingle or become numb, swollen, or painful.  Keep all follow-up visits as told by your health care provider. This is important. This information is not intended to replace advice given to you by your health care provider. Make sure you discuss any questions you have with your health care provider. Document Revised: 03/09/2018 Document Reviewed: 03/09/2018 Elsevier Patient Education  2021 [employer].

## 2020-12-25 NOTE — Progress Notes (Signed)
[name], female    DOB: 07-07-1966  Age: 55 y.o. MRN: [id number]    Subjective:  Subjective  HPI [name] presents for office visit today. Her caregiver reports that the patient started having trouble swallowing liquid and solid since her stroke. She states that the patient is able to swallow by blending the food and massaging her neck to help her with ingesting the food. She states that the symptoms have worsen over the last 2 weeks and fears that the patient might have experienced another stroke. Her caregiver states that the patient is not able to follow directions when eating for instance. She is concerned those might be symptoms of a stroke, but she endorses the patient having vision difficulties that make it hard for her to follow directions. She denies any chest pain, SOB, fever, abdominal pain, cough, chills, sore throat, dysuria, HA, or VD. She states that she feels shoulder pain that is bothering her.   Review of Systems  Constitutional: Negative for chills, fatigue and fever.  HENT: Positive for trouble swallowing. Negative for congestion, rhinorrhea, sinus pressure, sinus pain and sore throat.   Eyes: Positive for visual disturbance (decreased vision). Negative for pain.  Respiratory: Negative for cough and shortness of breath.   Cardiovascular: Negative for chest pain, palpitations and leg swelling.  Gastrointestinal: Negative for abdominal pain, blood in stool, diarrhea, nausea and vomiting.  Genitourinary: Positive for enuresis. Negative for decreased urine volume, flank pain, frequency, vaginal bleeding and vaginal discharge.  Musculoskeletal: Positive for back pain.       (+) shoulder pain  Neurological: Positive for speech difficulty. Negative for headaches.    History Past Medical History:  Diagnosis Date  . Abscess of great toe, right   . Acute on chronic diastolic CHF (congestive heart failure) ([place]) 06/24/2018  . Acute osteomyelitis of  toe, left ([place]) 09/05/2016  . Amputation of right great toe ([place]) 12/22/2018  . Anxiety   . Cataract    left - surgery to remove  . Cellulitis of foot, right 12/11/2018  . COMMON MIGRAINE 10/07/2010  . Coronary artery disease   . Decreased visual acuity 11/10/2016  . Depression 12/18/2012  . Diabetes mellitus type II, uncontrolled ([place]) 10/07/2010   Qualifier: Diagnosis of  By: Charlett Blake MD, [name]    . Diabetic foot infection ([place]) 08/26/2016  . Diabetic infection of right foot ([place])   . Disturbance of skin sensation 10/07/2010  . Gangrene of right foot ([place])   . GERD (gastroesophageal reflux disease)   . Heart murmur   . History of kidney stones    "years ago" - passed stones  . Hyperlipidemia 12/06/2010  . Hypertension   . Hypothyroidism   . Lipoma of abdominal wall 10/05/2016  . Overweight(278.02) 12/06/2010  . PERIPHERAL NEUROPATHY, FEET 10/07/2010   11/17/2019  . PVD (peripheral vascular disease) ([place]) 01/21/2012  . RESTLESS LEG SYNDROME 10/25/2010  . Stroke [facility]) 2014, 2017   most recently in 2/17 - intracerebral hemorrhage    She has a past surgical history that includes Wisdom tooth extraction; Amputation toe (Left, 09/09/2016); Coronary artery bypass graft (N/A, 06/28/2018); TEE without cardioversion (N/A, 06/28/2018); Radial artery harvest (Left, 06/28/2018); Endoharvest vein of greater saphenous vein (Right, 06/28/2018); LEFT HEART CATH AND CORONARY ANGIOGRAPHY (N/A, 06/24/2018); Amputation (Right, 12/13/2018); Cardiac catheterization (06/24/2018); Amputation (Right, 12/31/2018); Eye surgery (Left, 06/2017); Below knee leg amputation (Left, 09/21/2019); Amputation (Left, 09/21/2019); and Stump revision (Left, 11/18/2019).   Her family history includes Alcohol abuse in  her brother; Arthritis in her mother; Diabetes in her paternal grandmother; Healthy in her son; Leukemia in her brother; Stroke in her brother.She reports that she quit smoking about 15 years ago. She smoked 0.50 packs per day. She  has never used smokeless tobacco. She reports previous drug use. She reports that she does not drink alcohol.  Current Outpatient Medications on File Prior to Visit  Medication Sig Dispense Refill  . amLODipine (NORVASC) 10 MG tablet TAKE 1 TABLET BY MOUTH EVERY DAY 90 tablet 1  . docusate sodium (COLACE) 100 MG capsule Take 1 capsule (100 mg total) by mouth 2 (two) times daily. 10 capsule 0  . escitalopram (LEXAPRO) 10 MG tablet TAKE 1 TABLET BY MOUTH EVERY DAY 90 tablet 1  . ferrous sulfate 325 (65 FE) MG EC tablet TAKE 1 TABLET BY MOUTH EVERY DAY WITH BREAKFAST (Patient taking differently: Take 325 mg by mouth daily with breakfast.) 90 tablet 1  . losartan (COZAAR) 50 MG tablet Take 1 tablet (50 mg total) by mouth daily. 90 tablet 1  . metFORMIN (GLUCOPHAGE) 1000 MG tablet Take 1 tablet (1,000 mg total) by mouth 2 (two) times daily with a meal. 180 tablet 3  . Multiple Vitamin (MULTIVITAMIN WITH MINERALS) TABS tablet Take 1 tablet by mouth daily.    Marland Kitchen nystatin cream (MYCOSTATIN) Apply 1 application topically 2 (two) times daily. 30 g 2  . ondansetron (ZOFRAN) 4 MG tablet Take 1 tablet (4 mg total) by mouth every 8 (eight) hours as needed for nausea or vomiting. 20 tablet 0  . pantoprazole (PROTONIX) 40 MG tablet Take 1 tablet (40 mg total) by mouth daily. 30 tablet 0  . polyethylene glycol (MIRALAX / GLYCOLAX) 17 g packet Take 17 g by mouth daily. 14 each 0  . acetaminophen (TYLENOL) 325 MG tablet Take 2 tablets (650 mg total) by mouth every 6 (six) hours as needed for mild pain or headache.    . Amino Acids-Protein Hydrolys (FEEDING SUPPLEMENT, PRO-STAT 64,) LIQD Take 30 mLs by mouth 3 (three) times daily with meals. 2700 mL 2  . aspirin EC 81 MG EC tablet Take 1 tablet (81 mg total) by mouth daily. Swallow whole. 90 tablet 1  . atorvastatin (LIPITOR) 80 MG tablet Take 1 tablet (80 mg total) by mouth daily at 6 PM. 30 tablet 1  . azelastine (ASTELIN) 0.1 % nasal spray Place 2 sprays into both  nostrils 2 (two) times daily. Use in each nostril as directed 30 mL 3  . blood glucose meter kit and supplies Dispense based on patient and insurance preference. Use up to four times daily as directed. DX E11.59 1 each 11  . levothyroxine (SYNTHROID) 25 MCG tablet TAKE 1 TABLET (25 MCG TOTAL) BY MOUTH DAILY BEFORE BREAKFAST. (Patient taking differently: Take 25 mcg by mouth daily before breakfast.) 90 tablet 1  . metoCLOPramide (REGLAN) 5 MG tablet Take 1 tablet (5 mg total) by mouth 4 (four) times daily -  before meals and at bedtime. 90 tablet 0   No current facility-administered medications on file prior to visit.     Objective:  Objective  Physical Exam Constitutional:      General: She is not in acute distress.    Appearance: Normal appearance. She is not ill-appearing or toxic-appearing.  HENT:     Head: Normocephalic and atraumatic.     Right Ear: Tympanic membrane, ear canal and external ear normal.     Left Ear: Tympanic membrane, ear canal and external  ear normal.     Nose: No congestion or rhinorrhea.  Eyes:     Extraocular Movements: Extraocular movements intact.     Pupils: Pupils are equal, round, and reactive to light.  Cardiovascular:     Rate and Rhythm: Normal rate and regular rhythm.     Pulses: Normal pulses.     Heart sounds: Normal heart sounds. No murmur heard.   Pulmonary:     Effort: Pulmonary effort is normal. No respiratory distress.     Breath sounds: Normal breath sounds. No wheezing, rhonchi or rales.  Abdominal:     General: Bowel sounds are normal.     Palpations: Abdomen is soft. There is no mass.     Tenderness: There is no abdominal tenderness. There is no guarding.     Hernia: No hernia is present.  Musculoskeletal:        General: Normal range of motion.     Cervical back: Normal range of motion and neck supple.  Skin:    General: Skin is warm and dry.  Neurological:     Mental Status: She is alert and oriented to person, place, and time.   Psychiatric:        Behavior: Behavior normal.    BP 128/82   Pulse 87   Temp 98 F (36.7 C)   Resp 16   Wt 158 lb 12.8 oz (72 kg)   LMP 02/12/2011 (Exact Date)   SpO2 97%   BMI 29.04 kg/m  Wt Readings from Last 3 Encounters:  12/25/20 158 lb 12.8 oz (72 kg)  07/28/20 132 lb 4.4 oz (60 kg)  07/04/20 140 lb (63.5 kg)     Lab Results  Component Value Date   WBC 8.7 12/25/2020   HGB 10.9 (L) 12/25/2020   HCT 33.8 (L) 12/25/2020   PLT 613.0 (H) 12/25/2020   GLUCOSE 158 (H) 12/25/2020   CHOL 270 (H) 12/25/2020   TRIG 185.0 (H) 12/25/2020   HDL 37.70 (L) 12/25/2020   LDLDIRECT 133.0 03/02/2017   LDLCALC 196 (H) 12/25/2020   ALT 8 12/25/2020   AST 11 12/25/2020   NA 142 12/25/2020   K 3.7 12/25/2020   CL 100 12/25/2020   CREATININE 1.03 12/25/2020   BUN 18 12/25/2020   CO2 32 12/25/2020   TSH 2.30 12/25/2020   INR 1.30 06/28/2018   HGBA1C 8.0 (H) 12/25/2020   MICROALBUR 174.3 (H) 08/14/2017    CT HEAD WO CONTRAST  Result Date: 07/28/2020 CLINICAL DATA:  55 year old female with altered mental status, right thalamic lacunar infarct, subacute ischemia elsewhere in the right PCA territory on MRI this morning. Right MCA and PCA stenoses on MRA. EXAM: CT HEAD WITHOUT CONTRAST TECHNIQUE: Contiguous axial images were obtained from the base of the skull through the vertex without intravenous contrast. COMPARISON:  Brain MRI, intracranial MRA, plain head CT earlier today. FINDINGS: Brain: No midline shift, mass effect, or evidence of intracranial mass lesion. No ventriculomegaly. No acute intracranial hemorrhage identified. Patchy hypodensity in the right thalamus and elsewhere in the right PCA territory corresponding to a mix of acute, subacute, and chronic ischemia. Unchanged CT appearance from 0128 hours today. Superimposed chronic small infarcts also in the right basal ganglia/internal capsule, right cerebellum. Vascular: Calcified atherosclerosis at the skull base. No  suspicious intracranial vascular hyperdensity. Skull: No acute osseous abnormality identified. Sinuses/Orbits: Visualized paranasal sinuses and mastoids are stable. Other: No acute orbit or scalp soft tissue finding. IMPRESSION: 1. Stable CT appearance of the brain from  earlier today today with a mix of acute, subacute, and chronic ischemia in the Right PCA territory. No associated hemorrhage or mass effect. 2. Chronic small-vessel ischemia elsewhere. No new intracranial abnormality. Electronically Signed   By: Genevie Ann M.D.   On: 07/28/2020 12:25   CT Head Wo Contrast  Result Date: 07/28/2020 CLINICAL DATA:  Mental status change EXAM: CT HEAD WITHOUT CONTRAST TECHNIQUE: Contiguous axial images were obtained from the base of the skull through the vertex without intravenous contrast. COMPARISON:  CT and MRI 03/31/2016 FINDINGS: Brain: Geographic cortically based hypoattenuation situated right occipital lobe is new from comparison imaging and may reflect a subacute infarct. Additional scattered hypoattenuating foci in the left external capsule/insula, right thalamus and basal ganglia may reflect sequela more age indeterminate though likely remote lacunar type infarcts though the latter findings in the thalamus and basal ganglia are new from 2017. No evidence of acute hemorrhage, hydrocephalus, extra-axial collection, visible mass lesion or mass effect. Symmetric prominence of the ventricles, cisterns and sulci compatible with parenchymal volume loss. More confluent areas of white matter hypoattenuation are most compatible with chronic microvascular angiopathy. Vascular: Atherosclerotic calcification of the carotid siphons and intradural vertebral arteries. No hyperdense vessel. Skull: No calvarial fracture or suspicious osseous lesion. No scalp swelling or hematoma. Sinuses/Orbits: Paranasal sinuses and mastoid air cells are predominantly clear. Left lens extraction. Included orbital contents otherwise  unremarkable. Other: None. IMPRESSION: 1. Geographic cortically based hypoattenuation situated right occipital lobe is new from comparison imaging and may reflect a subacute infarct. 2. New hypoattenuating foci in the right thalamus and basal ganglia could reflect sequela of age-indeterminate lacunar type infarction new from 2017. A similar focus in the left insula is unchanged from comparison. 3. Chronic microvascular angiopathy and parenchymal volume loss. Critical Value/emergent results were called by telephone at the time of interpretation on 07/28/2020 at 3:01 am to provider [facility] , who verbally acknowledged these results. Electronically Signed   By: Lovena Le M.D.   On: 07/28/2020 03:01   MR ANGIO HEAD WO CONTRAST  Result Date: 07/28/2020 CLINICAL DATA:  Follow-up examination for acute stroke. EXAM: MRI HEAD WITHOUT CONTRAST MRA HEAD WITHOUT CONTRAST TECHNIQUE: Multiplanar, multiecho pulse sequences of the brain and surrounding structures were obtained without intravenous contrast. Angiographic images of the head were obtained using MRA technique without contrast. COMPARISON:  Prior CT from earlier the same day. FINDINGS: MRI HEAD FINDINGS Brain: Generalized age-related cerebral atrophy. Patchy and confluent T2/FLAIR hyperintensity within the periventricular deep white matter both cerebral hemispheres most consistent with chronic small vessel ischemic disease, moderate in nature. Remote lacunar infarcts noted at the right basal ganglia/corona radiata and left thalamus. Patchy involvement of the pons noted. Probable few additional remote lacunar infarcts noted about the cerebellum near the roof of the fourth ventricle (series 16, image 14). Encephalomalacia gliosis extending from the right periatrial white matter towards the overlying right parieto-occipital region consistent with right PCA territory ischemic change. Mild residual diffusion abnormality without ADC correlate, felt to be most  consistent with a late subacute to chronic ischemic infarct. Similar late subacute to chronic ischemic change noted involving the right corona radiata (series 5, image 78, left genu of the anterior corpus callosum (series 5, image 79, and left frontal corona radiata (series 5, image 77). Associated scattered areas of susceptibility artifact could reflect hemosiderin staining and/or laminar necrosis. There is additional 7 mm focus of restricted diffusion involving the ventral right thalamus (series 5, image 73). Associated signal loss on corresponding ADC  map, consistent with an acute ischemic infarct. Patchy extension towards the periventricular aspect of the third ventricle (series 5, image 71). Additional 11 mm focus of more mild diffusion signal abnormality involving the right periatrial white matter also demonstrates signal loss on corresponding ADC map (series 6, image 25), consistent with acute to subacute ischemia. Extension into the adjacent dorsal thalamus (series 5, image 71). No associated hemorrhage about these more acute infarcts. No other evidence for acute or subacute ischemia. Gray-white matter differentiation otherwise maintained. No evidence for acute intracranial hemorrhage. No mass lesion, midline shift or mass effect. Ventricles normal size without hydrocephalus. No extra-axial fluid collection. Pituitary gland suprasellar region within normal limits. Midline structures intact. Vascular: Major intracranial vascular flow voids are maintained. Asymmetric FLAIR signal intensity within the left transverse and sigmoid sinuses without associated T1 correlate favored to be related to slow/sluggish flow rather than thrombus. Skull and upper cervical spine: Craniocervical junction within normal limits. Bone marrow signal intensity normal. No scalp soft tissue abnormality. Sinuses/Orbits: Postoperative changes as noted at the left globe including ocular lens replacement. Triangular FLAIR density at the  left right none noted, which could be postoperative as well (series 11, image 9). Mild scattered mucosal thickening noted about the ethmoidal air cells, left sphenoid sinus, and right maxillary sinus. Paranasal sinuses are otherwise clear. No mastoid effusion. Inner ear structures grossly normal. Other: None. MRA HEAD FINDINGS ANTERIOR CIRCULATION: Examination mildly degraded by motion artifact. Visualized distal cervical segments of the internal carotid arteries are patent with antegrade flow. Petrous segments patent bilaterally. Scattered atheromatous irregularity within the carotid siphons without hemodynamically significant stenosis. A1 segments patent bilaterally. Normal anterior communicating artery complex. Anterior cerebral arteries patent to their distal aspects without high-grade stenosis. Short-segment mild-to-moderate proximal left M1 stenosis (series 1039, image 13). Left M1 bifurcates early. Distal left MCA branches well perfused although demonstrate diffuse small vessel atheromatous irregularity. Right M1 widely patent. Focal severe stenosis at the right MCA bifurcation to involve the adjacent proximal M2 segments (series 1039, image 11). Right MCA branches perfused distally although demonstrates small vessel atheromatous irregularity. POSTERIOR CIRCULATION: Both vertebral arteries patent to the vertebrobasilar junction without stenosis. Left vertebral artery slightly dominant. Both picas patent at their origins. Basilar widely patent proximally, with mild narrowing at the basilar tip. Superior cerebral arteries grossly patent bilaterally. Right PCA supplied via the basilar. There is a focal severe proximal right P2 stenosis, measuring approximately 4 mm in length (series 1051, image 11). Right PCA irregular but perfused distally. Fetal type origin of the left PCA. Left PCA also irregular but remains patent to its distal aspect. Distal moderate to severe bilateral P3 stenoses noted (series 1057, image  18). No aneurysm. IMPRESSION: MRI HEAD IMPRESSION: 1. 7 mm acute ischemic nonhemorrhagic infarct involving the ventral right thalamus. 2. Additional 11 mm acute to early subacute ischemic nonhemorrhagic right periatrial white matter infarcts, with extension to involve the adjacent dorsal right thalamus. 3. Late subacute to chronic ischemic infarcts involving the right parieto-occipital region, right corona radiata, and anterior left corpus callosum/corona radiata. 4. Underlying moderate chronic small vessel ischemic disease with multiple remote lacunar infarcts as above. MRA HEAD IMPRESSION: 1. Negative intracranial MRA for large vessel occlusion. 2. Focal severe proximal right P2 stenosis, likely accounting for the extensive right PCA distribution ischemic changes. 3. Additional severe stenosis at the right MCA bifurcation to involve the adjacent proximal M2 segments. 4. Moderate distal small vessel atheromatous irregularity throughout the intracranial circulation. Electronically Signed   By: Marland Kitchen  Jeannine Boga M.D.   On: 07/28/2020 06:12   MR BRAIN WO CONTRAST  Result Date: 07/28/2020 CLINICAL DATA:  Follow-up examination for acute stroke. EXAM: MRI HEAD WITHOUT CONTRAST MRA HEAD WITHOUT CONTRAST TECHNIQUE: Multiplanar, multiecho pulse sequences of the brain and surrounding structures were obtained without intravenous contrast. Angiographic images of the head were obtained using MRA technique without contrast. COMPARISON:  Prior CT from earlier the same day. FINDINGS: MRI HEAD FINDINGS Brain: Generalized age-related cerebral atrophy. Patchy and confluent T2/FLAIR hyperintensity within the periventricular deep white matter both cerebral hemispheres most consistent with chronic small vessel ischemic disease, moderate in nature. Remote lacunar infarcts noted at the right basal ganglia/corona radiata and left thalamus. Patchy involvement of the pons noted. Probable few additional remote lacunar infarcts noted  about the cerebellum near the roof of the fourth ventricle (series 16, image 14). Encephalomalacia gliosis extending from the right periatrial white matter towards the overlying right parieto-occipital region consistent with right PCA territory ischemic change. Mild residual diffusion abnormality without ADC correlate, felt to be most consistent with a late subacute to chronic ischemic infarct. Similar late subacute to chronic ischemic change noted involving the right corona radiata (series 5, image 78, left genu of the anterior corpus callosum (series 5, image 79, and left frontal corona radiata (series 5, image 77). Associated scattered areas of susceptibility artifact could reflect hemosiderin staining and/or laminar necrosis. There is additional 7 mm focus of restricted diffusion involving the ventral right thalamus (series 5, image 73). Associated signal loss on corresponding ADC map, consistent with an acute ischemic infarct. Patchy extension towards the periventricular aspect of the third ventricle (series 5, image 71). Additional 11 mm focus of more mild diffusion signal abnormality involving the right periatrial white matter also demonstrates signal loss on corresponding ADC map (series 6, image 25), consistent with acute to subacute ischemia. Extension into the adjacent dorsal thalamus (series 5, image 71). No associated hemorrhage about these more acute infarcts. No other evidence for acute or subacute ischemia. Gray-white matter differentiation otherwise maintained. No evidence for acute intracranial hemorrhage. No mass lesion, midline shift or mass effect. Ventricles normal size without hydrocephalus. No extra-axial fluid collection. Pituitary gland suprasellar region within normal limits. Midline structures intact. Vascular: Major intracranial vascular flow voids are maintained. Asymmetric FLAIR signal intensity within the left transverse and sigmoid sinuses without associated T1 correlate favored to  be related to slow/sluggish flow rather than thrombus. Skull and upper cervical spine: Craniocervical junction within normal limits. Bone marrow signal intensity normal. No scalp soft tissue abnormality. Sinuses/Orbits: Postoperative changes as noted at the left globe including ocular lens replacement. Triangular FLAIR density at the left right none noted, which could be postoperative as well (series 11, image 9). Mild scattered mucosal thickening noted about the ethmoidal air cells, left sphenoid sinus, and right maxillary sinus. Paranasal sinuses are otherwise clear. No mastoid effusion. Inner ear structures grossly normal. Other: None. MRA HEAD FINDINGS ANTERIOR CIRCULATION: Examination mildly degraded by motion artifact. Visualized distal cervical segments of the internal carotid arteries are patent with antegrade flow. Petrous segments patent bilaterally. Scattered atheromatous irregularity within the carotid siphons without hemodynamically significant stenosis. A1 segments patent bilaterally. Normal anterior communicating artery complex. Anterior cerebral arteries patent to their distal aspects without high-grade stenosis. Short-segment mild-to-moderate proximal left M1 stenosis (series 1039, image 13). Left M1 bifurcates early. Distal left MCA branches well perfused although demonstrate diffuse small vessel atheromatous irregularity. Right M1 widely patent. Focal severe stenosis at the right MCA bifurcation to involve  the adjacent proximal M2 segments (series 1039, image 11). Right MCA branches perfused distally although demonstrates small vessel atheromatous irregularity. POSTERIOR CIRCULATION: Both vertebral arteries patent to the vertebrobasilar junction without stenosis. Left vertebral artery slightly dominant. Both picas patent at their origins. Basilar widely patent proximally, with mild narrowing at the basilar tip. Superior cerebral arteries grossly patent bilaterally. Right PCA supplied via the  basilar. There is a focal severe proximal right P2 stenosis, measuring approximately 4 mm in length (series 1051, image 11). Right PCA irregular but perfused distally. Fetal type origin of the left PCA. Left PCA also irregular but remains patent to its distal aspect. Distal moderate to severe bilateral P3 stenoses noted (series 1057, image 18). No aneurysm. IMPRESSION: MRI HEAD IMPRESSION: 1. 7 mm acute ischemic nonhemorrhagic infarct involving the ventral right thalamus. 2. Additional 11 mm acute to early subacute ischemic nonhemorrhagic right periatrial white matter infarcts, with extension to involve the adjacent dorsal right thalamus. 3. Late subacute to chronic ischemic infarcts involving the right parieto-occipital region, right corona radiata, and anterior left corpus callosum/corona radiata. 4. Underlying moderate chronic small vessel ischemic disease with multiple remote lacunar infarcts as above. MRA HEAD IMPRESSION: 1. Negative intracranial MRA for large vessel occlusion. 2. Focal severe proximal right P2 stenosis, likely accounting for the extensive right PCA distribution ischemic changes. 3. Additional severe stenosis at the right MCA bifurcation to involve the adjacent proximal M2 segments. 4. Moderate distal small vessel atheromatous irregularity throughout the intracranial circulation. Electronically Signed   By: Jeannine Boga M.D.   On: 07/28/2020 06:12   DG Chest Portable 1 View  Result Date: 07/28/2020 CLINICAL DATA:  Altered mental status, increasing lethargy EXAM: PORTABLE CHEST 1 VIEW COMPARISON:  Radiograph 07/13/2018 FINDINGS: Low lung volumes and streaky atelectatic changes. Mild vascular congestion without features of frank edema. No consolidative opacity. No pneumothorax or visible effusion. Postsurgical changes related to prior CABG including intact and aligned sternotomy wires and multiple surgical clips projecting over the mediastinum. The aorta is calcified. The remaining  cardiomediastinal contours are unremarkable. No acute osseous or soft tissue abnormality. Telemetry leads overlie the chest. IMPRESSION: 1. Low lung volumes, atelectasis 2. Mild vascular congestion without features of frank edema. 3. Prior sternotomy and CABG. Electronically Signed   By: Lovena Le M.D.   On: 07/28/2020 03:52   EEG adult  Result Date: 07/28/2020 Lora Havens, MD     07/28/2020  4:34 PM Patient Name: JULIE-ANN VANMAANEN MRN: [id number] Epilepsy Attending: Lora Havens Referring Physician/Provider: Mikey Bussing, PA Date: 07/28/2020 Duration: 26.18 mins  atient history: 56yo F with chronic right occipital infarct, acre right thalamic infarct with worsening ams. EEG to evaluate for seizure Level of alertness: Awake, asleep AEDs during EEG study: None Technical aspects: This EEG study was done with scalp electrodes positioned according to the 10-20 International system of electrode placement. Electrical activity was acquired at a sampling rate of 500Hz  and reviewed with a high frequency filter of 70Hz  and a low frequency filter of 1Hz . EEG data were recorded continuously and digitally stored. Description: No clear posterior dominant rhythm was seen. Sleep was characterized by vertex waves, sleep spindles (12 to 14 Hz), maximal frontocentral region. EEG showed continuous generalized  to 6 Hz theta as well as intermittent rhythmic rhythmic delta slowing. Hyperventilation and photic stimulation were not performed.   ABNORMALITY -Continued slow, generalized -Intermittent rhythmic delta slow, generalized IMPRESSION: This study is suggestive of moderate diffuse encephalopathy, nonspecific etiology. No seizures or epileptiform discharges  were seen throughout the recording. Lora Havens   ECHOCARDIOGRAM COMPLETE BUBBLE STUDY  Result Date: 07/29/2020    ECHOCARDIOGRAM REPORT   Patient Name:   LESHAE MCCLAY Date of Exam: 07/29/2020 Medical Rec #:  [id number]         Height:       62.0 in  Accession #:    [id number]        Weight:       132.3 lb Date of Birth:  [birth year]          BSA:          1.604 m Patient Age:    32 years          BP:           128/69 mmHg Patient Gender: F                 HR:           78 bpm. Exam Location:  Inpatient Procedure: 2D Echo and Saline Contrast Bubble Study Indications:    stroke 434.91  History:        Patient has prior history of Echocardiogram examinations, most                 recent 06/28/2018.  Sonographer:    Johny Chess Referring Phys: [id number] [place]  1. Left ventricular ejection fraction, by estimation, is 55 to 60%. The left ventricle has normal function. The left ventricle has no regional wall motion abnormalities. Left ventricular diastolic parameters are consistent with Grade II diastolic dysfunction (pseudonormalization). Elevated left ventricular end-diastolic pressure.  2. Right ventricular systolic function is normal. The right ventricular size is normal.  3. The mitral valve is normal in structure. Trivial mitral valve regurgitation. No evidence of mitral stenosis.  4. The aortic valve is tricuspid. Aortic valve regurgitation is not visualized. Mild aortic valve sclerosis is present, with no evidence of aortic valve stenosis.  5. The inferior vena cava is normal in size with greater than 50% respiratory variability, suggesting right atrial pressure of 3 mmHg.  6. Agitated saline contrast bubble study was negative, with no evidence of any interatrial shunt. FINDINGS  Left Ventricle: Left ventricular ejection fraction, by estimation, is 55 to 60%. The left ventricle has normal function. The left ventricle has no regional wall motion abnormalities. The left ventricular internal cavity size was normal in size. There is  no left ventricular hypertrophy. Left ventricular diastolic parameters are consistent with Grade II diastolic dysfunction (pseudonormalization). Elevated left ventricular end-diastolic pressure. Right Ventricle:  The right ventricular size is normal. No increase in right ventricular wall thickness. Right ventricular systolic function is normal. Left Atrium: Left atrial size was normal in size. Right Atrium: Right atrial size was normal in size. Pericardium: There is no evidence of pericardial effusion. Mitral Valve: The mitral valve is normal in structure. There is mild thickening of the mitral valve leaflet(s). There is mild calcification of the mitral valve leaflet(s). Mild mitral annular calcification. Trivial mitral valve regurgitation. No evidence  of mitral valve stenosis. Tricuspid Valve: The tricuspid valve is normal in structure. Tricuspid valve regurgitation is trivial. No evidence of tricuspid stenosis. Aortic Valve: The aortic valve is tricuspid. Aortic valve regurgitation is not visualized. Mild aortic valve sclerosis is present, with no evidence of aortic valve stenosis. Pulmonic Valve: The pulmonic valve was normal in structure. Pulmonic valve regurgitation is not visualized. No evidence of pulmonic stenosis. Aorta: The aortic root is normal  in size and structure. Venous: The inferior vena cava is normal in size with greater than 50% respiratory variability, suggesting right atrial pressure of 3 mmHg. IAS/Shunts: No atrial level shunt detected by color flow Doppler. Agitated saline contrast was given intravenously to evaluate for intracardiac shunting. Agitated saline contrast bubble study was negative, with no evidence of any interatrial shunt.  LEFT VENTRICLE PLAX 2D LVIDd:         4.00 cm  Diastology LVIDs:         2.70 cm  LV e' medial:    3.92 cm/s LV PW:         1.10 cm  LV E/e' medial:  22.1 LV IVS:        1.00 cm  LV e' lateral:   7.29 cm/s LVOT diam:     1.70 cm  LV E/e' lateral: 11.9 LV SV:         33 LV SV Index:   21 LVOT Area:     2.27 cm  RIGHT VENTRICLE            IVC RV S prime:     9.57 cm/s  IVC diam: 1.50 cm TAPSE (M-mode): 2.4 cm LEFT ATRIUM             Index       RIGHT ATRIUM           Index LA diam:        2.90 cm 1.81 cm/m  RA Area:     8.52 cm LA Vol (A2C):   28.3 ml 17.65 ml/m RA Volume:   14.80 ml 9.23 ml/m LA Vol (A4C):   29.0 ml 18.08 ml/m LA Biplane Vol: 30.0 ml 18.71 ml/m  AORTIC VALVE LVOT Vmax:   75.10 cm/s LVOT Vmean:  47.100 cm/s LVOT VTI:    0.146 m  AORTA Ao Root diam: 2.70 cm Ao Asc diam:  3.00 cm MITRAL VALVE MV Area (PHT): 3.53 cm    SHUNTS MV Decel Time: 215 msec    Systemic VTI:  0.15 m MV E velocity: 86.50 cm/s  Systemic Diam: 1.70 cm MV A velocity: 99.40 cm/s MV E/A ratio:  0.87 Jenkins Rouge MD Electronically signed by Jenkins Rouge MD Signature Date/Time: 07/29/2020/9:21:25 AM    Final    VAS US CAROTID  Result Date: 07/29/2020 Carotid Arterial Duplex Study Indications:       CVA. Risk Factors:      Hypertension, hyperlipidemia, Diabetes, coronary artery                    disease, prior CVA. Comparison Study:  04/01/16 previous Performing Technologist: Abram Sander RVS  Examination Guidelines: A complete evaluation includes B-mode imaging, spectral Doppler, color Doppler, and power Doppler as needed of all accessible portions of each vessel. Bilateral testing is considered an integral part of a complete examination. Limited examinations for reoccurring indications may be performed as noted.  Right Carotid Findings: +----------+--------+--------+--------+------------------+--------+           PSV cm/sEDV cm/sStenosisPlaque DescriptionComments +----------+--------+--------+--------+------------------+--------+ CCA Prox  61      13              heterogenous               +----------+--------+--------+--------+------------------+--------+ CCA Distal60      15              heterogenous               +----------+--------+--------+--------+------------------+--------+ ICA  Prox  102     26      1-39%   heterogenous               +----------+--------+--------+--------+------------------+--------+ ICA Distal64      14                                          +----------+--------+--------+--------+------------------+--------+ ECA       135     15                                         +----------+--------+--------+--------+------------------+--------+ +----------+--------+-------+--------+-------------------+           PSV cm/sEDV cmsDescribeArm Pressure (mmHG) +----------+--------+-------+--------+-------------------+ [id number]                                         +----------+--------+-------+--------+-------------------+ +---------+--------+--+--------+-+---------+ VertebralPSV cm/s24EDV cm/s5Antegrade +---------+--------+--+--------+-+---------+  Left Carotid Findings: +----------+--------+--------+--------+------------------+--------+           PSV cm/sEDV cm/sStenosisPlaque DescriptionComments +----------+--------+--------+--------+------------------+--------+ CCA Prox  94      18              heterogenous               +----------+--------+--------+--------+------------------+--------+ CCA Distal80      15              heterogenous               +----------+--------+--------+--------+------------------+--------+ ICA Prox  82      24      1-39%   heterogenous               +----------+--------+--------+--------+------------------+--------+ ICA Distal60      17                                         +----------+--------+--------+--------+------------------+--------+ ECA       116     14                                         +----------+--------+--------+--------+------------------+--------+ +----------+--------+--------+--------+-------------------+           PSV cm/sEDV cm/sDescribeArm Pressure (mmHG) +----------+--------+--------+--------+-------------------+ [id number]                                         +----------+--------+--------+--------+-------------------+ +---------+--------+--+--------+-+---------+ VertebralPSV cm/s29EDV cm/s7Antegrade  +---------+--------+--+--------+-+---------+   Summary: Right Carotid: Velocities in the right ICA are consistent with a 1-39% stenosis. Left Carotid: Velocities in the left ICA are consistent with a 1-39% stenosis. Vertebrals: Bilateral vertebral arteries demonstrate antegrade flow. *See table(s) above for measurements and observations.  Electronically signed by Deitra Mayo MD on 07/29/2020 at 6:22:54 PM.    Final      Assessment & Plan:  Plan    No orders of the defined types were placed in this encounter.   Problem List Items Addressed This Visit    Severe protein-calorie malnutrition ([place]) (Chronic)  Improving encouraged to increase protein intake      Anemia of chronic disease (Chronic)   Relevant Orders   CBC (Completed)   Essential hypertension, benign    Well controlled, no changes to meds. Encouraged heart healthy diet such as the DASH diet and exercise as tolerated.       Relevant Orders   Lipid panel (Completed)   TSH (Completed)   Hyperlipidemia    Encouraged heart healthy diet, increase exercise, avoid trans fats, consider a krill oil cap daily      Recurrent strokes (HCC)    Her full time aide was out several weeks with covid and now is noting over the past few weeks the patient is much less verbal. She was feeding her self and now is not, she notes the patient is now having trouble swallowing food, liquids and meds, have ordered her a MRI scan of the brain and speech therapy as well as Neurology consultation      Relevant Orders   Ambulatory referral to Neurology   Ambulatory referral to [place]   Decreased visual acuity   Relevant Orders   Ambulatory referral to Ophthalmology   MR Brain W Wo Contrast   Type 2 diabetes mellitus with vascular disease ([place])    hgba1c improving, minimize simple carbs. Increase exercise as tolerated. Continue current meds      Relevant Orders   Hemoglobin A1c (Completed)   Comprehensive metabolic panel  (Completed)   Lipid panel (Completed)   MR Brain W Wo Contrast   CKD (chronic kidney disease), stage III (HCC)    Hydrate and monitor      Hypothyroidism    On Levothyroxine, continue to monitor      Relevant Orders   Lipid panel (Completed)   S/P bilateral BKA (below knee amputation) ([place])   Relevant Orders   Ambulatory referral to [place]   Dysphagia   Relevant Orders   Ambulatory referral to Speech Therapy   MR Brain W Wo Contrast   Acute pain of right shoulder - Primary    Has been bothering her for a couple of weeks. No report of obvious injury but her aide questions if her arm was injured when she was being moved. Xray performed and consider referral to ortho for further consideration      Relevant Orders   DG Shoulder Right (Completed)    Other Visit Diagnoses    Expressive language disorder       Relevant Orders   MR Brain W Wo Contrast      Follow-up: Return in about 3 months (around 03/26/2021).   I,David Hanna,acting as a scribe for Penni Homans, MD.,have documented all relevant documentation on the behalf of Penni Homans, MD,as directed by  Penni Homans, MD while in the presence of Penni Homans, MD.  I, Mosie Lukes, MD personally performed the services described in this documentation. All medical record entries made by the scribe were at my direction and in my presence. I have reviewed the chart and agree that the record reflects my personal performance and is accurate and complete

## 2020-12-26 ENCOUNTER — Other Ambulatory Visit: Payer: Self-pay

## 2020-12-26 DIAGNOSIS — R1310 Dysphagia, unspecified: Secondary | ICD-10-CM | POA: Insufficient documentation

## 2020-12-26 DIAGNOSIS — M25511 Pain in right shoulder: Secondary | ICD-10-CM

## 2020-12-26 NOTE — Assessment & Plan Note (Signed)
Improving encouraged to increase protein intake

## 2020-12-26 NOTE — Assessment & Plan Note (Signed)
On Levothyroxine, continue to monitor 

## 2020-12-26 NOTE — Assessment & Plan Note (Signed)
Hydrate and monitor 

## 2020-12-26 NOTE — Assessment & Plan Note (Signed)
Has been bothering her for a couple of weeks. No report of obvious injury but her aide questions if her arm was injured when she was being moved. Xray performed and consider referral to ortho for further consideration

## 2020-12-26 NOTE — Assessment & Plan Note (Signed)
Encouraged heart healthy diet, increase exercise, avoid trans fats, consider a krill oil cap daily 

## 2020-12-26 NOTE — Assessment & Plan Note (Signed)
hgba1c improving, minimize simple carbs. Increase exercise as tolerated. Continue current meds.   

## 2020-12-26 NOTE — Assessment & Plan Note (Addendum)
Her full time aide was out several weeks with covid and now is noting over the past few weeks the patient is much less verbal. She was feeding her self and now is not, she notes the patient is now having trouble swallowing food, liquids and meds, have ordered her a MRI scan of the brain and speech therapy as well as Neurology consultation

## 2020-12-26 NOTE — Assessment & Plan Note (Signed)
Well controlled, no changes to meds. Encouraged heart healthy diet such as the DASH diet and exercise as tolerated.  °

## 2020-12-28 ENCOUNTER — Emergency Department (HOSPITAL_COMMUNITY): Payer: BC Managed Care – PPO

## 2020-12-28 ENCOUNTER — Inpatient Hospital Stay (HOSPITAL_COMMUNITY)
Admission: EM | Admit: 2020-12-28 | Discharge: 2021-01-04 | DRG: 871 | Disposition: A | Discharge status: Hospice | Payer: BC Managed Care – PPO | Attending: Internal Medicine | Admitting: Internal Medicine

## 2020-12-28 ENCOUNTER — Other Ambulatory Visit: Payer: Self-pay

## 2020-12-28 ENCOUNTER — Inpatient Hospital Stay (HOSPITAL_COMMUNITY): Payer: BC Managed Care – PPO

## 2020-12-28 ENCOUNTER — Encounter (HOSPITAL_COMMUNITY): Payer: Self-pay | Admitting: Family Medicine

## 2020-12-28 DIAGNOSIS — I5032 Chronic diastolic (congestive) heart failure: Secondary | ICD-10-CM | POA: Diagnosis present

## 2020-12-28 DIAGNOSIS — E785 Hyperlipidemia, unspecified: Secondary | ICD-10-CM | POA: Diagnosis present

## 2020-12-28 DIAGNOSIS — Z89511 Acquired absence of right leg below knee: Secondary | ICD-10-CM

## 2020-12-28 DIAGNOSIS — I708 Atherosclerosis of other arteries: Secondary | ICD-10-CM | POA: Diagnosis not present

## 2020-12-28 DIAGNOSIS — Z66 Do not resuscitate: Secondary | ICD-10-CM | POA: Diagnosis not present

## 2020-12-28 DIAGNOSIS — Z7189 Other specified counseling: Secondary | ICD-10-CM

## 2020-12-28 DIAGNOSIS — E1151 Type 2 diabetes mellitus with diabetic peripheral angiopathy without gangrene: Secondary | ICD-10-CM | POA: Diagnosis present

## 2020-12-28 DIAGNOSIS — L8990 Pressure ulcer of unspecified site, unspecified stage: Secondary | ICD-10-CM | POA: Insufficient documentation

## 2020-12-28 DIAGNOSIS — I2699 Other pulmonary embolism without acute cor pulmonale: Secondary | ICD-10-CM | POA: Diagnosis present

## 2020-12-28 DIAGNOSIS — R0603 Acute respiratory distress: Secondary | ICD-10-CM

## 2020-12-28 DIAGNOSIS — Z823 Family history of stroke: Secondary | ICD-10-CM

## 2020-12-28 DIAGNOSIS — Z833 Family history of diabetes mellitus: Secondary | ICD-10-CM

## 2020-12-28 DIAGNOSIS — J690 Pneumonitis due to inhalation of food and vomit: Secondary | ICD-10-CM | POA: Diagnosis not present

## 2020-12-28 DIAGNOSIS — J929 Pleural plaque without asbestos: Secondary | ICD-10-CM | POA: Diagnosis not present

## 2020-12-28 DIAGNOSIS — D72829 Elevated white blood cell count, unspecified: Secondary | ICD-10-CM | POA: Diagnosis not present

## 2020-12-28 DIAGNOSIS — E11319 Type 2 diabetes mellitus with unspecified diabetic retinopathy without macular edema: Secondary | ICD-10-CM | POA: Diagnosis present

## 2020-12-28 DIAGNOSIS — Z20822 Contact with and (suspected) exposure to covid-19: Secondary | ICD-10-CM | POA: Diagnosis present

## 2020-12-28 DIAGNOSIS — I69354 Hemiplegia and hemiparesis following cerebral infarction affecting left non-dominant side: Secondary | ICD-10-CM

## 2020-12-28 DIAGNOSIS — Z7401 Bed confinement status: Secondary | ICD-10-CM | POA: Diagnosis not present

## 2020-12-28 DIAGNOSIS — Z91018 Allergy to other foods: Secondary | ICD-10-CM

## 2020-12-28 DIAGNOSIS — N183 Chronic kidney disease, stage 3 unspecified: Secondary | ICD-10-CM | POA: Diagnosis present

## 2020-12-28 DIAGNOSIS — E1159 Type 2 diabetes mellitus with other circulatory complications: Secondary | ICD-10-CM | POA: Diagnosis present

## 2020-12-28 DIAGNOSIS — K219 Gastro-esophageal reflux disease without esophagitis: Secondary | ICD-10-CM | POA: Diagnosis present

## 2020-12-28 DIAGNOSIS — I2510 Atherosclerotic heart disease of native coronary artery without angina pectoris: Secondary | ICD-10-CM | POA: Diagnosis present

## 2020-12-28 DIAGNOSIS — Z951 Presence of aortocoronary bypass graft: Secondary | ICD-10-CM | POA: Diagnosis not present

## 2020-12-28 DIAGNOSIS — Z634 Disappearance and death of family member: Secondary | ICD-10-CM

## 2020-12-28 DIAGNOSIS — N3941 Urge incontinence: Secondary | ICD-10-CM | POA: Diagnosis present

## 2020-12-28 DIAGNOSIS — Z79890 Hormone replacement therapy (postmenopausal): Secondary | ICD-10-CM

## 2020-12-28 DIAGNOSIS — R1310 Dysphagia, unspecified: Secondary | ICD-10-CM | POA: Diagnosis not present

## 2020-12-28 DIAGNOSIS — Z885 Allergy status to narcotic agent status: Secondary | ICD-10-CM

## 2020-12-28 DIAGNOSIS — A419 Sepsis, unspecified organism: Principal | ICD-10-CM | POA: Diagnosis present

## 2020-12-28 DIAGNOSIS — Z89512 Acquired absence of left leg below knee: Secondary | ICD-10-CM | POA: Diagnosis not present

## 2020-12-28 DIAGNOSIS — I69322 Dysarthria following cerebral infarction: Secondary | ICD-10-CM

## 2020-12-28 DIAGNOSIS — L89322 Pressure ulcer of left buttock, stage 2: Secondary | ICD-10-CM | POA: Diagnosis present

## 2020-12-28 DIAGNOSIS — F419 Anxiety disorder, unspecified: Secondary | ICD-10-CM | POA: Diagnosis present

## 2020-12-28 DIAGNOSIS — G319 Degenerative disease of nervous system, unspecified: Secondary | ICD-10-CM | POA: Diagnosis not present

## 2020-12-28 DIAGNOSIS — I10 Essential (primary) hypertension: Secondary | ICD-10-CM | POA: Diagnosis present

## 2020-12-28 DIAGNOSIS — K8020 Calculus of gallbladder without cholecystitis without obstruction: Secondary | ICD-10-CM | POA: Diagnosis not present

## 2020-12-28 DIAGNOSIS — R0602 Shortness of breath: Secondary | ICD-10-CM | POA: Diagnosis not present

## 2020-12-28 DIAGNOSIS — Z7984 Long term (current) use of oral hypoglycemic drugs: Secondary | ICD-10-CM

## 2020-12-28 DIAGNOSIS — Z7982 Long term (current) use of aspirin: Secondary | ICD-10-CM

## 2020-12-28 DIAGNOSIS — N179 Acute kidney failure, unspecified: Secondary | ICD-10-CM | POA: Diagnosis present

## 2020-12-28 DIAGNOSIS — Z515 Encounter for palliative care: Secondary | ICD-10-CM | POA: Diagnosis not present

## 2020-12-28 DIAGNOSIS — I6601 Occlusion and stenosis of right middle cerebral artery: Secondary | ICD-10-CM | POA: Diagnosis not present

## 2020-12-28 DIAGNOSIS — Z87891 Personal history of nicotine dependence: Secondary | ICD-10-CM

## 2020-12-28 DIAGNOSIS — G9340 Encephalopathy, unspecified: Secondary | ICD-10-CM | POA: Diagnosis not present

## 2020-12-28 DIAGNOSIS — E1165 Type 2 diabetes mellitus with hyperglycemia: Secondary | ICD-10-CM | POA: Diagnosis present

## 2020-12-28 DIAGNOSIS — I6340 Cerebral infarction due to embolism of unspecified cerebral artery: Secondary | ICD-10-CM | POA: Diagnosis not present

## 2020-12-28 DIAGNOSIS — J9601 Acute respiratory failure with hypoxia: Secondary | ICD-10-CM | POA: Diagnosis not present

## 2020-12-28 DIAGNOSIS — G9341 Metabolic encephalopathy: Secondary | ICD-10-CM | POA: Diagnosis not present

## 2020-12-28 DIAGNOSIS — F321 Major depressive disorder, single episode, moderate: Secondary | ICD-10-CM | POA: Diagnosis not present

## 2020-12-28 DIAGNOSIS — F17210 Nicotine dependence, cigarettes, uncomplicated: Secondary | ICD-10-CM | POA: Diagnosis present

## 2020-12-28 DIAGNOSIS — D638 Anemia in other chronic diseases classified elsewhere: Secondary | ICD-10-CM | POA: Diagnosis present

## 2020-12-28 DIAGNOSIS — I6381 Other cerebral infarction due to occlusion or stenosis of small artery: Secondary | ICD-10-CM

## 2020-12-28 DIAGNOSIS — R29722 NIHSS score 22: Secondary | ICD-10-CM | POA: Diagnosis present

## 2020-12-28 DIAGNOSIS — J309 Allergic rhinitis, unspecified: Secondary | ICD-10-CM | POA: Diagnosis present

## 2020-12-28 DIAGNOSIS — R112 Nausea with vomiting, unspecified: Secondary | ICD-10-CM | POA: Diagnosis not present

## 2020-12-28 DIAGNOSIS — J168 Pneumonia due to other specified infectious organisms: Secondary | ICD-10-CM | POA: Diagnosis not present

## 2020-12-28 DIAGNOSIS — D75839 Thrombocytosis, unspecified: Secondary | ICD-10-CM | POA: Diagnosis present

## 2020-12-28 DIAGNOSIS — E1142 Type 2 diabetes mellitus with diabetic polyneuropathy: Secondary | ICD-10-CM | POA: Diagnosis present

## 2020-12-28 DIAGNOSIS — R0989 Other specified symptoms and signs involving the circulatory and respiratory systems: Secondary | ICD-10-CM | POA: Diagnosis not present

## 2020-12-28 DIAGNOSIS — G2581 Restless legs syndrome: Secondary | ICD-10-CM | POA: Diagnosis present

## 2020-12-28 DIAGNOSIS — E876 Hypokalemia: Secondary | ICD-10-CM | POA: Diagnosis present

## 2020-12-28 DIAGNOSIS — R0902 Hypoxemia: Secondary | ICD-10-CM | POA: Diagnosis not present

## 2020-12-28 DIAGNOSIS — I639 Cerebral infarction, unspecified: Secondary | ICD-10-CM | POA: Diagnosis not present

## 2020-12-28 DIAGNOSIS — I619 Nontraumatic intracerebral hemorrhage, unspecified: Secondary | ICD-10-CM | POA: Diagnosis not present

## 2020-12-28 DIAGNOSIS — R000 Tachycardia, unspecified: Secondary | ICD-10-CM | POA: Diagnosis not present

## 2020-12-28 DIAGNOSIS — J189 Pneumonia, unspecified organism: Secondary | ICD-10-CM | POA: Diagnosis not present

## 2020-12-28 DIAGNOSIS — Z6829 Body mass index (BMI) 29.0-29.9, adult: Secondary | ICD-10-CM

## 2020-12-28 DIAGNOSIS — I252 Old myocardial infarction: Secondary | ICD-10-CM | POA: Diagnosis not present

## 2020-12-28 DIAGNOSIS — Z716 Tobacco abuse counseling: Secondary | ICD-10-CM

## 2020-12-28 DIAGNOSIS — G546 Phantom limb syndrome with pain: Secondary | ICD-10-CM | POA: Diagnosis present

## 2020-12-28 DIAGNOSIS — Z79899 Other long term (current) drug therapy: Secondary | ICD-10-CM

## 2020-12-28 DIAGNOSIS — I130 Hypertensive heart and chronic kidney disease with heart failure and stage 1 through stage 4 chronic kidney disease, or unspecified chronic kidney disease: Secondary | ICD-10-CM | POA: Diagnosis not present

## 2020-12-28 DIAGNOSIS — I6621 Occlusion and stenosis of right posterior cerebral artery: Secondary | ICD-10-CM | POA: Diagnosis not present

## 2020-12-28 DIAGNOSIS — M2550 Pain in unspecified joint: Secondary | ICD-10-CM | POA: Diagnosis not present

## 2020-12-28 DIAGNOSIS — Z880 Allergy status to penicillin: Secondary | ICD-10-CM

## 2020-12-28 DIAGNOSIS — E039 Hypothyroidism, unspecified: Secondary | ICD-10-CM | POA: Diagnosis present

## 2020-12-28 DIAGNOSIS — I6503 Occlusion and stenosis of bilateral vertebral arteries: Secondary | ICD-10-CM | POA: Diagnosis not present

## 2020-12-28 DIAGNOSIS — E669 Obesity, unspecified: Secondary | ICD-10-CM | POA: Diagnosis present

## 2020-12-28 DIAGNOSIS — I6330 Cerebral infarction due to thrombosis of unspecified cerebral artery: Secondary | ICD-10-CM | POA: Diagnosis not present

## 2020-12-28 DIAGNOSIS — R531 Weakness: Secondary | ICD-10-CM | POA: Diagnosis not present

## 2020-12-28 DIAGNOSIS — R4182 Altered mental status, unspecified: Secondary | ICD-10-CM | POA: Diagnosis not present

## 2020-12-28 LAB — COMPREHENSIVE METABOLIC PANEL
ALT: 16 U/L (ref 0–44)
AST: 21 U/L (ref 15–41)
Albumin: 2.7 g/dL — ABNORMAL LOW (ref 3.5–5.0)
Alkaline Phosphatase: 81 U/L (ref 38–126)
Anion gap: 11 (ref 5–15)
BUN: 23 mg/dL — ABNORMAL HIGH (ref 6–20)
CO2: 29 mmol/L (ref 22–32)
Calcium: 9 mg/dL (ref 8.9–10.3)
Chloride: 103 mmol/L (ref 98–111)
Creatinine, Ser: 1.16 mg/dL — ABNORMAL HIGH (ref 0.44–1.00)
GFR, Estimated: 55 mL/min — ABNORMAL LOW (ref >60)
Glucose, Bld: 208 mg/dL — ABNORMAL HIGH (ref 70–99)
Potassium: 3.6 mmol/L (ref 3.5–5.1)
Sodium: 143 mmol/L (ref 135–145)
Total Bilirubin: 1 mg/dL (ref 0.3–1.2)
Total Protein: 7.2 g/dL (ref 6.5–8.1)

## 2020-12-28 LAB — RESP PANEL BY RT-PCR (FLU A&B, COVID) ARPGX2
Influenza A by PCR: NEGATIVE
Influenza B by PCR: NEGATIVE
SARS Coronavirus 2 by RT PCR: NEGATIVE

## 2020-12-28 LAB — LIPASE, BLOOD: Lipase: 31 U/L (ref 11–51)

## 2020-12-28 LAB — CBC WITH DIFFERENTIAL/PLATELET
Abs Immature Granulocytes: 0.04 10*3/uL (ref 0.00–0.07)
Basophils Absolute: 0.1 10*3/uL (ref 0.0–0.1)
Basophils Relative: 1 %
Eosinophils Absolute: 0.1 10*3/uL (ref 0.0–0.5)
Eosinophils Relative: 0 %
HCT: 35.7 % — ABNORMAL LOW (ref 36.0–46.0)
Hemoglobin: 11.3 g/dL — ABNORMAL LOW (ref 12.0–15.0)
Immature Granulocytes: 0 %
Lymphocytes Relative: 10 %
Lymphs Abs: 1.5 10*3/uL (ref 0.7–4.0)
MCH: 27.8 pg (ref 26.0–34.0)
MCHC: 31.7 g/dL (ref 30.0–36.0)
MCV: 87.9 fL (ref 80.0–100.0)
Monocytes Absolute: 0.8 10*3/uL (ref 0.1–1.0)
Monocytes Relative: 5 %
Neutro Abs: 12 10*3/uL — ABNORMAL HIGH (ref 1.7–7.7)
Neutrophils Relative %: 84 %
Platelets: 617 10*3/uL — ABNORMAL HIGH (ref 150–400)
RBC: 4.06 MIL/uL (ref 3.87–5.11)
RDW: 14.6 % (ref 11.5–15.5)
WBC: 14.4 10*3/uL — ABNORMAL HIGH (ref 4.0–10.5)
nRBC: 0 % (ref 0.0–0.2)

## 2020-12-28 LAB — LACTIC ACID, PLASMA: Lactic Acid, Venous: 1.6 mmol/L (ref 0.5–1.9)

## 2020-12-28 MED ORDER — LOSARTAN POTASSIUM 50 MG PO TABS
50.0000 mg | ORAL_TABLET | Freq: Once | ORAL | Status: DC
  Filled 2020-12-28: qty 1

## 2020-12-28 MED ORDER — ACETAMINOPHEN 325 MG PO TABS
650.0000 mg | ORAL_TABLET | Freq: Once | ORAL | Status: DC
  Filled 2020-12-28 (×2): qty 2

## 2020-12-28 MED ORDER — METOPROLOL TARTRATE 5 MG/5ML IV SOLN: 5.0000 mg | Freq: Once | INTRAVENOUS | Status: DC

## 2020-12-28 MED ORDER — SODIUM CHLORIDE 0.9 % IV SOLN
1.0000 g | Freq: Once | INTRAVENOUS | Status: AC
  Administered 2020-12-29: 1 g via INTRAVENOUS
  Filled 2020-12-28: qty 10

## 2020-12-28 MED ORDER — LACTATED RINGERS IV BOLUS
500.0000 mL | Freq: Once | INTRAVENOUS | Status: AC
  Administered 2020-12-28: 500 mL via INTRAVENOUS

## 2020-12-28 MED ORDER — METRONIDAZOLE IN NACL 5-0.79 MG/ML-% IV SOLN
500.0000 mg | Freq: Once | INTRAVENOUS | Status: AC
  Administered 2020-12-28: 500 mg via INTRAVENOUS
  Filled 2020-12-28: qty 100

## 2020-12-28 NOTE — H&P (Signed)
History and Physical    [name] SJG:[id number] DOB: December 26, 1965 DOA: 12/28/2020  PCP: Mosie Lukes, MD   Patient coming from:  Home  Chief Complaint:  Cough, respiratory distress  HPI: [name] is a 55 y.o. female with medical history significant for DMT2 with vascular disease, HTN, Hx of CVA, hx of bilateral BKA, hx of intracranial hemorrhage, CAD, hypothyroidism who lives with her son who is her caregiver. She is bedbound. No family at bedside at this time. Attempted to call son but did not answer phone. History obtained from ER physician.  Reportedly patient was slow to awaken and get out of bed this morning.  Her son took her on the porch to sit in the sun which is not uncommon for him to do with her when she started to cough and had increasing shortness of breath.  She reportedly vomited some nonbloody emesis.  Her son had gone in to get a drink when he came back out and found her gurgling and called EMS.  When they arrived they did suction her and got a significant amount of thick yellowish phlegm from the back of her throat.  She was noted to have oxygen saturation around 85% at that time.  She does not use oxygen at home.  The son reportedly told the ER physician that patient has had increasing difficulty with swallowing and coughing and gagging when she eats over the last few months but has not had this evaluated.  Reportedly at home she does not have any fevers but in the emergency room she had temperature to 101.6 degrees.  She did not complain of any abdominal pain or chest pain at home per report.  She did not have her home medications today prior to coming to the hospital.  Was seen by her PCP a few days ago and had blood work done which revealed a hemoglobin A1c of 8.0.  No recent travel.  ED Course: In the emergency room patient is noted to have temperature to 101.6 degrees and elevated white blood cell count greater than 14,000.  Electrolytes are normal.  CT the  head was obtained which showed a lacunar infarct of unknown age.  ER physician discussed with neurology who recommended MRI of the brain and they would see the patient after MRI is obtained.  Chest x-ray showed no active disease.  Patient has had witnessed coughing and gagging with aspiration in the emergency room patient was started on antibiotic empirically for aspiration pneumonia.  CT of the chest with contrast has been ordered and is pending.  Review of Systems:  ROS cannot be obtained as pt is nonverbal.  Past Medical History:  Diagnosis Date  . Abscess of great toe, right   . Acute on chronic diastolic CHF (congestive heart failure) ([place]) 06/24/2018  . Acute osteomyelitis of toe, left ([place]) 09/05/2016  . Amputation of right great toe ([place]) 12/22/2018  . Anxiety   . Cataract    left - surgery to remove  . Cellulitis of foot, right 12/11/2018  . COMMON MIGRAINE 10/07/2010  . Coronary artery disease   . Decreased visual acuity 11/10/2016  . Depression 12/18/2012  . Diabetes mellitus type II, uncontrolled ([place]) 10/07/2010   Qualifier: Diagnosis of  By: Charlett Blake MD, [name]    . Diabetic foot infection ([place]) 08/26/2016  . Diabetic infection of right foot ([place])   . Disturbance of skin sensation 10/07/2010  . Gangrene of right foot ([place])   . GERD (gastroesophageal  reflux disease)   . Heart murmur   . History of kidney stones    "years ago" - passed stones  . Hyperlipidemia 12/06/2010  . Hypertension   . Hypothyroidism   . Lipoma of abdominal wall 10/05/2016  . Overweight(278.02) 12/06/2010  . PERIPHERAL NEUROPATHY, FEET 10/07/2010   11/17/2019  . PVD (peripheral vascular disease) ([place]) 01/21/2012  . RESTLESS LEG SYNDROME 10/25/2010  . Stroke [facility]) 2014, 2017   most recently in 2/17 - intracerebral hemorrhage    Past Surgical History:  Procedure Laterality Date  . AMPUTATION Right 12/13/2018   Procedure: AMPUTATION RIGHT GREAT TOE, LOCAL RELOCATION OF TISSUE FOR WOUND CLOSURE 9cm x 3cm, VAC  APPLICATION;  Surgeon: [name], MD;  Location: [place];  Service: Orthopedics;  Laterality: Right;  . AMPUTATION Right 12/31/2018   Procedure: RIGHT BELOW KNEE AMPUTATION;  Surgeon: [name], MD;  Location: [place];  Service: Orthopedics;  Laterality: Right;  . AMPUTATION Left 09/21/2019   Procedure: LEFT BELOW KNEE AMPUTATION;  Surgeon: [name], MD;  Location: [place];  Service: Orthopedics;  Laterality: Left;  . AMPUTATION TOE Left 09/09/2016   Procedure: AMPUTATION OF LEFT GREAT TOE;  Surgeon: Milly Jakob, MD;  Location: [place];  Service: Orthopedics;  Laterality: Left;  . BELOW KNEE LEG AMPUTATION Left 09/21/2019  . CARDIAC CATHETERIZATION  06/24/2018  . CORONARY ARTERY BYPASS GRAFT N/A 06/28/2018   Procedure: CORONARY ARTERY BYPASS GRAFTING (CABG) times  four, using left internal mammary artery, endoscopically harvested right saphenous vein, and harvested left radial artery;  Surgeon: [name], MD;  Location: [place];  Service: Open Heart Surgery;  Laterality: N/A;  . ENDOVEIN HARVEST OF GREATER SAPHENOUS VEIN Right 06/28/2018   Procedure: ENDOVEIN HARVEST OF GREATER SAPHENOUS VEIN;  Surgeon: [name], MD;  Location: [place];  Service: Open Heart Surgery;  Laterality: Right;  . EYE SURGERY Left 06/2017   [facility]  . LEFT HEART CATH AND CORONARY ANGIOGRAPHY N/A 06/24/2018   Procedure: LEFT HEART CATH AND CORONARY ANGIOGRAPHY;  Surgeon: Troy Sine, MD;  Location: [place] [facility];  Service: Cardiovascular;  Laterality: N/A;  . RADIAL ARTERY HARVEST Left 06/28/2018   Procedure: RADIAL ARTERY HARVEST;  Surgeon: [name], MD;  Location: [employer];  Service: Open Heart Surgery;  Laterality: Left;  . STUMP REVISION Left 11/18/2019   Procedure: REVISION LEFT BELOW KNEE AMPUTATION;  Surgeon: [name], MD;  Location: [place];  Service: Orthopedics;  Laterality: Left;  . TEE WITHOUT CARDIOVERSION N/A 06/28/2018   Procedure:  TRANSESOPHAGEAL ECHOCARDIOGRAM (TEE);  Surgeon: [name], MD;  Location: [place];  Service: Open Heart Surgery;  Laterality: N/A;  . WISDOM TOOTH EXTRACTION      Social History  reports that she quit smoking about 15 years ago. She smoked 0.50 packs per day. She has never used smokeless tobacco. She reports previous drug use. She reports that she does not drink alcohol.  Allergies  Allergen Reactions  . Lyrica [Pregabalin] Other (See Comments)    MADE PATIENT VERY EMOTIONAL AND WOULD CRY EASILY  . Morphine And Related Nausea And Vomiting  . Penicillins Other (See Comments)    GI upset related to Augmentin use  Did it involve swelling of the face/tongue/throat, SOB, or low BP? No Did it involve sudden or severe rash/hives, skin peeling, or any reaction on the inside of your mouth or nose? No Did you need to seek medical attention at a hospital  or doctor's office? No When did it last happen?April 2020 If all above answers are "NO", may proceed with cephalosporin use.   . Tramadol Nausea And Vomiting    Pt cant tolerate this pain med.     Family History  Problem Relation Age of Onset  . Arthritis Mother   . Stroke Brother        previous smoker  . Alcohol abuse Brother        in remission  . Leukemia Brother   . Diabetes Paternal Grandmother   . Healthy Son      Prior to Admission medications   Medication Sig Start Date End Date Taking? Authorizing Provider  acetaminophen (TYLENOL) 325 MG tablet Take 2 tablets (650 mg total) by mouth every 6 (six) hours as needed for mild pain or headache. 07/06/18   Lars Pinks M, PA-C  Amino Acids-Protein Hydrolys (FEEDING SUPPLEMENT, PRO-STAT 64,) LIQD Take 30 mLs by mouth 3 (three) times daily with meals. 07/04/20   Suzan Slick, NP  amLODipine (NORVASC) 10 MG tablet TAKE 1 TABLET BY MOUTH EVERY DAY 10/15/20   Saguier, Percell Miller, PA-C  aspirin EC 81 MG EC tablet Take 1 tablet (81 mg total) by mouth daily. Swallow  whole. 08/03/20   Mercy Riding, MD  atorvastatin (LIPITOR) 80 MG tablet Take 1 tablet (80 mg total) by mouth daily at 6 PM. 10/11/19   Angiulli, Lavon Paganini, PA-C  azelastine (ASTELIN) 0.1 % nasal spray Place 2 sprays into both nostrils 2 (two) times daily. Use in each nostril as directed 01/23/20   Mosie Lukes, MD  blood glucose meter kit and supplies Dispense based on patient and insurance preference. Use up to four times daily as directed. DX E11.59 11/25/19   Mosie Lukes, MD  docusate sodium (COLACE) 100 MG capsule Take 1 capsule (100 mg total) by mouth 2 (two) times daily. 01/13/19   Angiulli, Lavon Paganini, PA-C  escitalopram (LEXAPRO) 10 MG tablet TAKE 1 TABLET BY MOUTH EVERY DAY 05/18/20   Mosie Lukes, MD  ferrous sulfate 325 (65 FE) MG EC tablet TAKE 1 TABLET BY MOUTH EVERY DAY WITH BREAKFAST Patient taking differently: Take 325 mg by mouth daily with breakfast. 03/26/20   Saguier, Percell Miller, PA-C  levothyroxine (SYNTHROID) 25 MCG tablet TAKE 1 TABLET (25 MCG TOTAL) BY MOUTH DAILY BEFORE BREAKFAST. Patient taking differently: Take 25 mcg by mouth daily before breakfast. 01/13/19   Angiulli, Lavon Paganini, PA-C  losartan (COZAAR) 50 MG tablet Take 1 tablet (50 mg total) by mouth daily. 08/02/20   Mercy Riding, MD  metFORMIN (GLUCOPHAGE) 1000 MG tablet Take 1 tablet (1,000 mg total) by mouth 2 (two) times daily with a meal. 11/25/19   Carollee Herter, Alferd Apa, DO  metoCLOPramide (REGLAN) 5 MG tablet Take 1 tablet (5 mg total) by mouth 4 (four) times daily -  before meals and at bedtime. 01/13/19   Angiulli, Lavon Paganini, PA-C  Multiple Vitamin (MULTIVITAMIN WITH MINERALS) TABS tablet Take 1 tablet by mouth daily. 01/14/19   Angiulli, Lavon Paganini, PA-C  nystatin cream (MYCOSTATIN) Apply 1 application topically 2 (two) times daily. 03/30/20   Mosie Lukes, MD  ondansetron (ZOFRAN) 4 MG tablet Take 1 tablet (4 mg total) by mouth every 8 (eight) hours as needed for nausea or vomiting. 09/27/20   Debbrah Alar, NP   pantoprazole (PROTONIX) 40 MG tablet Take 1 tablet (40 mg total) by mouth daily. 10/11/19   Angiulli, Lavon Paganini, PA-C  polyethylene  glycol (MIRALAX / GLYCOLAX) 17 g packet Take 17 g by mouth daily. 10/12/19   Cathlyn Parsons, PA-C    Physical Exam: Vitals:   12/28/20 1937 12/28/20 2000 12/28/20 2030 12/28/20 2055  BP:  (!) 219/122 (!) 200/120   Pulse:  (!) 147 (!) 123   Resp:  20 14   Temp:    (!) 101.5 F (38.6 C)  TempSrc:    Rectal  SpO2:  99% (!) 85%   Weight: 72 kg     Height: 5' 2" (1.575 m)       Constitutional: NAD, calm, comfortable Vitals:   12/28/20 1937 12/28/20 2000 12/28/20 2030 12/28/20 2055  BP:  (!) 219/122 (!) 200/120   Pulse:  (!) 147 (!) 123   Resp:  20 14   Temp:    (!) 101.5 F (38.6 C)  TempSrc:    Rectal  SpO2:  99% (!) 85%   Weight: 72 kg     Height: 5' 2" (1.575 m)      General: WDWN, Alert and oriented to self.  Eyes: EOMI, PERRL, conjunctivae normal.  Sclera nonicteric HENT:  [place]/AT, external ears normal.  Nares patent without epistasis.  Mucous membranes are moist. Thick yellow phlem in posterior oropharynx. Neck: Soft, normal passive range of motion, supple, no masses, no thyromegaly. Trachea midline Respiratory: Equal breath sounds with bibasilar rales and crackles in right base. no wheezing. Normal respiratory effort. No accessory muscle use. Nonproductive cough Cardiovascular: Regular tachycardia, no murmurs / rubs / gallops. No extremity edema. Abdomen: Soft, no tenderness, nondistended, no rebound or guarding.  No masses palpated. Bowel sounds normoactive Musculoskeletal: FROM. no cyanosis. Normal muscle tone. Bilateral BKA Skin: Warm, dry, intact no rashes, lesions, ulcers. No induration Neurologic: CN 2-12 grossly intact. Sensation intact. Strength 4/5 upper extremities.  .    Labs on Admission: I have personally reviewed following labs and imaging studies  CBC: Recent Labs  Lab 12/25/20 1055 12/28/20 2011  WBC 8.7 14.4*   NEUTROABS  --  12.0*  HGB 10.9* 11.3*  HCT 33.8* 35.7*  MCV 84.0 87.9  PLT 613.0* 617*    Basic Metabolic Panel: Recent Labs  Lab 12/25/20 1055 12/28/20 2011  NA 142 143  K 3.7 3.6  CL 100 103  CO2 32 29  GLUCOSE 158* 208*  BUN 18 23*  CREATININE 1.03 1.16*  CALCIUM 9.2 9.0    GFR: Estimated Creatinine Clearance: 49.8 mL/min (A) (by C-G formula based on SCr of 1.16 mg/dL (H)).  Liver Function Tests: Recent Labs  Lab 12/25/20 1055 12/28/20 2011  AST 11 21  ALT 8 16  ALKPHOS 93 81  BILITOT 0.5 1.0  PROT 6.9 7.2  ALBUMIN 3.1* 2.7*    Urine analysis:    Component Value Date/Time   COLORURINE YELLOW 07/28/2020 0300   APPEARANCEUR HAZY (A) 07/28/2020 0300   LABSPEC 1.020 07/28/2020 0300   PHURINE 7.0 07/28/2020 0300   GLUCOSEU >=500 (A) 07/28/2020 0300   GLUCOSEU NEGATIVE 05/21/2015 0831   HGBUR NEGATIVE 07/28/2020 0300   BILIRUBINUR NEGATIVE 07/28/2020 0300   KETONESUR NEGATIVE 07/28/2020 0300   PROTEINUR >=300 (A) 07/28/2020 0300   UROBILINOGEN 0.2 05/21/2015 0831   NITRITE NEGATIVE 07/28/2020 0300   LEUKOCYTESUR NEGATIVE 07/28/2020 0300    Radiological Exams on Admission: CT Head Wo Contrast  Result Date: 12/28/2020 CLINICAL DATA:  Mental status change, unknown cause. EXAM: CT HEAD WITHOUT CONTRAST TECHNIQUE: Contiguous axial images were obtained from the base of the skull through  the vertex without intravenous contrast. COMPARISON:  Prior head CT examination 07/28/2020. Prior brain MRI 07/28/2020. FINDINGS: Brain: Mild cerebral and cerebellar atrophy. Chronic cortical/subcortical infarct within the right parietal and occipital lobes (PCA vascular territory). Associated ex vacuo dilatation of the posterior right lateral ventricle. Chronic infarct within the right corona radiata/basal ganglia. Chronic infarct within the right thalamus. Background mild patchy and ill-defined hypoattenuation within the cerebral white matter, nonspecific but compatible with  chronic small vessel ischemic disease. There is a lacunar infarct within the left pons and left pontomesencephalic junction, which is new as compared to the head CT of 07/28/2020 but otherwise age indeterminate. Additional chronic lacunar infarct within the central pons. A small chronic infarct is also questioned within the left cerebellum. There is no acute intracranial hemorrhage. No demarcated cortical infarct. No extra-axial fluid collection. No evidence of intracranial mass. No midline shift. Vascular: No hyperdense vessel.  Atherosclerotic calcifications. Skull: Normal. Negative for fracture or focal lesion. Sinuses/Orbits: Visualized orbits show no acute finding. Trace right maxillary sinus mucosal thickening. IMPRESSION: A lacunar infarct within the left pons and pontomesencephalic junction is new from the prior head CT of 07/28/2020, but otherwise age indeterminate. Consider a brain MRI for further evaluation. Stable background generalized parenchymal atrophy and chronic ischemic disease with multiple chronic infarcts, as described. Electronically Signed   By: Kellie Simmering DO   On: 12/28/2020 18:37   DG Chest Port 1 View  Result Date: 12/28/2020 CLINICAL DATA:  Choking. EXAM: PORTABLE CHEST 1 VIEW COMPARISON:  07/28/2020 FINDINGS: Sequelae of CABG are again identified. The cardiomediastinal silhouette is within normal limits. The lungs are better inflated than on the prior study. No confluent airspace opacity, edema, pleural effusion, pneumothorax is identified. No acute osseous abnormality is seen. IMPRESSION: No active disease. Electronically Signed   By: Logan Bores M.D.   On: 12/28/2020 17:15    EKG: Independently reviewed.  EKG shows sinus tachycardia.  There is inferior ST depression but no ST elevation.  Borderline LVH by voltage criteria.  TC is 424  Assessment/Plan Principal Problem:   Aspiration pneumonia  Ms. Bartl is admitted to Progressive care unit with aspiration pneumonia  associated with respiratory distress and hypoxia. Pt has complex medical history and will need close monitoring and frequent suctioning of oropharynx to prevent further aspiration.  Pt is kept NPO and will consult ST for swallowing evaluation.  Started on Unasyn for coverage of aspiration pneumonia.  Patient notes a penicillin allergy but it is recorded as GI upset with Augmentin with no history of shortness of breath, swelling of lips or throat or rash with penicillin treatment.  Discussed with pharmacist who feels Unasyn would be a safe choice to use will be better for aspiration coverage then Rocephin and Flagyl that were ordered in the ER. Frequent turning, encouraged to cough and incentive spirometer to help prevent atelectasis. RT to follow with aspiration risk and hypoxia Chest x-ray shows no active disease.  CT of the chest with contrast has been ordered and is pending  Active Problems:   Respiratory distress On oxygen to keep O2 sat between 92 to 96%    Essential hypertension, benign Home antihypertensives are held with patient being n.p.o. with aspiration risk.   Will allow permissive hypertension of 220/110 this time until MRI is completed to evaluate if infarct is acute that was seen on CT scan.   May be able to resume home antihypertensives after swallowing evaluation with speech therapy tomorrow    Type 2 diabetes  mellitus with vascular disease  Patient with hemoglobin A1c 3 days ago that was 8.0.  Blood sugars be monitored every 6 hours while n.p.o. and sliding scale insulin provided as needed for glycemic control. Hold metformin for 48 hours after being given IV contrast for CT of chest.    Lacunar infarction  CT of the head shows a lacunar infarct that is new since the last CT in November 2021.  Age of infarct is not determined on CT scan.  MRI of the brain has been ordered.  Neurology was consulted by the ER physician and will see patient after MRI is completed.  Aspiration  precautions.  Permissive hypertension until MRI of the brain is completed to rule out acute infarct    Thrombocytosis Chronically elevated platelet count is stable.  Patient has a history of intracranial hemorrhage and possible acute infarct so anticoagulation is not given at this time    Anemia of chronic disease Stable. Monitor and recheck CBC in am.     S/P bilateral BKA (below knee amputation)      DVT prophylaxis: SCDs for DVT prophylaxis with elevated bleeding risk with hx of intracranial hemorrhage and lacunar infarct of unknown age on CT of the head.  MRI of brain pending Code Status:   Full code.  Called son to discuss code status but no answer. Family Communication:  No family at bedside. No answer when called family phone number in chart Disposition Plan:   Patient is from:  Home  Anticipated DC to:  SNF vs Home with home health, to be determined during hospitalization  Anticipated DC date:  Anticipate more than 2 midnight stay in hospital  Anticipated DC barriers: If SNF is required, may be a barrier to discharge in finding a bed  Consults called:   Neurology consulted by ER physician Admission status:  Inpatient   Yevonne Aline  MD [facility]  How to contact the [facility] Attending or Consulting provider [place] or covering provider during after hours 7P -7A, for this patient?   1. Check the care team in [facility] and look for a) attending/consulting [facility] provider listed and b) the [facility] team listed 2. Log into [email or web address] and use [place]'s universal password to access. If you do not have the password, please contact the hospital operator. 3. Locate the [facility] provider you are looking for under [facility] and page to a number that you can be directly reached. 4. If you still have difficulty reaching the provider, please page the [facility] (Director on Call) for the Hospitalists listed on amion for assistance.  12/28/2020, 9:19 PM

## 2020-12-28 NOTE — ED Notes (Signed)
Pt able to suction own airway. Pt given [place] for suctioning

## 2020-12-28 NOTE — ED Provider Notes (Signed)
[facility] Provider Note   CSN: [id number] Arrival date & time: 12/28/20  1617     History Chief Complaint  Patient presents with  . Dysphagia    Pt arrived  via EMS with c/o of choking. This is a chronic issue.     [name] is a 55 y.o. female.  HPI Per son, earlier today she had a choking episode. She has been panicked due to a family member who is currently sick. She got out of bed slower than normal today. Son had placed her on the porch (their normal routine) when she had worsening SOB and sudden vomiting large amount of NBNB emesis.  Son says that he heard her gurgling and EMS arrived and did suctioning.  Patient felt better after suctioning.  She does not use any oxygen at baseline.  The son does note that she has had gradual onset of difficulty with swallowing and choking over the past few months. He denies any recent fevers or chills, nausea or vomiting, syncope or chest pain.  Patient agrees with this.  Past Medical History:  Diagnosis Date  . Abscess of great toe, right   . Acute on chronic diastolic CHF (congestive heart failure) ([place]) 06/24/2018  . Acute osteomyelitis of toe, left ([place]) 09/05/2016  . Amputation of right great toe ([place]) 12/22/2018  . Anxiety   . Cataract    left - surgery to remove  . Cellulitis of foot, right 12/11/2018  . COMMON MIGRAINE 10/07/2010  . Coronary artery disease   . Decreased visual acuity 11/10/2016  . Depression 12/18/2012  . Diabetes mellitus type II, uncontrolled ([place]) 10/07/2010   Qualifier: Diagnosis of  By: Charlett Blake MD, [name]    . Diabetic foot infection ([place]) 08/26/2016  . Diabetic infection of right foot ([place])   . Disturbance of skin sensation 10/07/2010  . Gangrene of right foot ([place])   . GERD (gastroesophageal reflux disease)   . Heart murmur   . History of kidney stones    "years ago" - passed stones  . Hyperlipidemia 12/06/2010  . Hypertension   . Hypothyroidism   . Lipoma of  abdominal wall 10/05/2016  . Overweight(278.02) 12/06/2010  . PERIPHERAL NEUROPATHY, FEET 10/07/2010   11/17/2019  . PVD (peripheral vascular disease) ([place]) 01/21/2012  . RESTLESS LEG SYNDROME 10/25/2010  . Stroke [facility]) 2014, 2017   most recently in 2/17 - intracerebral hemorrhage    Patient Active Problem List   Diagnosis Date Noted  . Aspiration pneumonia ([place]) 12/28/2020  . Lacunar infarction ([place]) 12/28/2020  . Thrombocytosis 12/28/2020  . Respiratory distress 12/28/2020  . Dysphagia 12/26/2020  . Acute pain of right shoulder 12/26/2020  . Leukocytosis   . AMS (altered mental status) 07/28/2020  . Cerebral thrombosis with cerebral infarction 07/28/2020  . Candidal skin infection 04/01/2020  . Polyradiculoneuropathy ([place] [facility]) 01/23/2020  . Sun-damaged skin 01/23/2020  . Uncontrolled type 2 diabetes mellitus with hyperglycemia ([place]) 11/25/2019  . Wound infection after surgery 11/25/2019  . Adjustment disorder with mixed anxiety and depressed mood   . S/P bilateral BKA (below knee amputation) ([place]) 09/27/2019  . Acute osteomyelitis of left foot ([place]) 09/21/2019  . Subacute osteomyelitis, left ankle and foot ([place])   . Hx of right BKA ([place]) 02/09/2019  . Diabetes mellitus type 2 in nonobese (HCC)   . Labile blood pressure   . Phantom limb pain ([place])   . CKD (chronic kidney disease), stage III ([place])   . Hypothyroidism   .  Chronic diastolic congestive heart failure ([place])   . Coronary artery disease involving coronary bypass graft of native heart without angina pectoris   . Poorly controlled type 2 diabetes mellitus with peripheral neuropathy ([place])   . Acute on chronic anemia   . History of below knee amputation, right ([place]) 12/31/2018  . S/P CABG x 3 06/28/2018  . Coronary artery disease involving native coronary artery of native heart with angina pectoris ([place]) 06/25/2018  . Type 2 diabetes mellitus with vascular disease ([place]) 06/24/2018  . Acute on chronic diastolic CHF (congestive  heart failure) ([place]) 06/24/2018  . Non-ST elevation (NSTEMI) myocardial infarction ([place]) 06/24/2018  . Chest pain 06/23/2018  . Glaucoma 09/17/2017  . Diabetic retinopathy ([place]) 09/17/2017  . Otitis media 11/10/2016  . Decreased visual acuity 11/10/2016  . Spinal stenosis, lumbar region with neurogenic claudication 10/22/2016  . Other spondylosis with radiculopathy, lumbar region 10/13/2016  . Lipoma of abdominal wall 10/05/2016  . Severe protein-calorie malnutrition ([place]) 09/04/2016  . Anemia of chronic disease 09/04/2016  . Diabetic polyneuropathy associated with type 2 diabetes mellitus ([place]) 05/07/2016  . Orthostatic hypotension 05/07/2016  . Recurrent strokes ([place]) 04/27/2016  . N&V (nausea and vomiting) 04/01/2016  . ICH (intracerebral hemorrhage) ([place]) 03/30/2016  . Visit for preventive health examination 05/21/2015  . Breast cancer screening 05/21/2015  . Arm lesion 07/02/2014  . Pain in limb 01/13/2014  . Neuropathic pain of both legs 01/13/2014  . Hip pain 01/12/2014  . Allergic rhinitis 03/10/2013  . Back pain 03/08/2013  . Depression 12/18/2012  . PVD (peripheral vascular disease) ([place]) 01/21/2012  . Hyperlipidemia 12/06/2010  . Overweight(278.02) 12/06/2010  . RESTLESS LEG SYNDROME 10/25/2010  . Migraine without aura 10/07/2010  . Hereditary and idiopathic peripheral neuropathy 10/07/2010  . Essential hypertension, benign 10/07/2010  . DISTURBANCE OF SKIN SENSATION 10/07/2010  . HEART MURMUR, HX OF 10/07/2010  . NEPHROLITHIASIS, HX OF 10/07/2010    Past Surgical History:  Procedure Laterality Date  . AMPUTATION Right 12/13/2018   Procedure: AMPUTATION RIGHT GREAT TOE, LOCAL RELOCATION OF TISSUE FOR WOUND CLOSURE 9cm x 3cm, VAC APPLICATION;  Surgeon: [name], MD;  Location: [facility];  Service: Orthopedics;  Laterality: Right;  . AMPUTATION Right 12/31/2018   Procedure: RIGHT BELOW KNEE AMPUTATION;  Surgeon: [name], MD;  Location: [facility];  Service:  Orthopedics;  Laterality: Right;  . AMPUTATION Left 09/21/2019   Procedure: LEFT BELOW KNEE AMPUTATION;  Surgeon: [name], MD;  Location: [facility];  Service: Orthopedics;  Laterality: Left;  . AMPUTATION TOE Left 09/09/2016   Procedure: AMPUTATION OF LEFT GREAT TOE;  Surgeon: Milly Jakob, MD;  Location: [facility];  Service: Orthopedics;  Laterality: Left;  . BELOW KNEE LEG AMPUTATION Left 09/21/2019  . CARDIAC CATHETERIZATION  06/24/2018  . CORONARY ARTERY BYPASS GRAFT N/A 06/28/2018   Procedure: CORONARY ARTERY BYPASS GRAFTING (CABG) times  four, using left internal mammary artery, endoscopically harvested right saphenous vein, and harvested left radial artery;  Surgeon: [name], MD;  Location: [facility];  Service: Open Heart Surgery;  Laterality: N/A;  . ENDOVEIN HARVEST OF GREATER SAPHENOUS VEIN Right 06/28/2018   Procedure: ENDOVEIN HARVEST OF GREATER SAPHENOUS VEIN;  Surgeon: [name], MD;  Location: [facility];  Service: Open Heart Surgery;  Laterality: Right;  . EYE SURGERY Left 06/2017   [facility]  . LEFT HEART CATH AND CORONARY ANGIOGRAPHY N/A 06/24/2018   Procedure: LEFT HEART CATH AND CORONARY ANGIOGRAPHY;  Surgeon: Shelva Majestic  A, MD;  Location: [facility];  Service: Cardiovascular;  Laterality: N/A;  . RADIAL ARTERY HARVEST Left 06/28/2018   Procedure: RADIAL ARTERY HARVEST;  Surgeon: [name], MD;  Location: [facility];  Service: Open Heart Surgery;  Laterality: Left;  . STUMP REVISION Left 11/18/2019   Procedure: REVISION LEFT BELOW KNEE AMPUTATION;  Surgeon: [name], MD;  Location: [facility];  Service: Orthopedics;  Laterality: Left;  . TEE WITHOUT CARDIOVERSION N/A 06/28/2018   Procedure: TRANSESOPHAGEAL ECHOCARDIOGRAM (TEE);  Surgeon: [name], MD;  Location: [facility];  Service: Open Heart Surgery;  Laterality: N/A;  . WISDOM TOOTH EXTRACTION       OB History   No obstetric history on file.     Family History   Problem Relation Age of Onset  . Arthritis Mother   . Stroke Brother        previous smoker  . Alcohol abuse Brother        in remission  . Leukemia Brother   . Diabetes Paternal Grandmother   . Healthy Son     Social History   Tobacco Use  . Smoking status: Former Smoker    Packs/day: 0.50    Quit date: 09/08/2005    Years since quitting: 15.3  . Smokeless tobacco: Never Used  . Tobacco comment: " 1 smoke a week in the past"  Vaping Use  . Vaping Use: Never used  Substance Use Topics  . Alcohol use: No    Alcohol/week: 0.0 standard drinks  . Drug use: Not Currently    Home Medications Prior to Admission medications   Medication Sig Start Date End Date Taking? Authorizing Provider  amLODipine (NORVASC) 10 MG tablet TAKE 1 TABLET BY MOUTH EVERY DAY Patient taking differently: Take 10 mg by mouth every evening. 10/15/20  Yes Saguier, Percell Miller, PA-C  escitalopram (LEXAPRO) 10 MG tablet TAKE 1 TABLET BY MOUTH EVERY DAY Patient taking differently: Take 10 mg by mouth every evening. 05/18/20  Yes Mosie Lukes, MD  ferrous sulfate 325 (65 FE) MG EC tablet TAKE 1 TABLET BY MOUTH EVERY DAY WITH BREAKFAST Patient taking differently: Take 325 mg by mouth daily with breakfast. 03/26/20  Yes Saguier, Percell Miller, PA-C  losartan (COZAAR) 50 MG tablet Take 1 tablet (50 mg total) by mouth daily. Patient taking differently: Take 50 mg by mouth every evening. 08/02/20  Yes Mercy Riding, MD  metFORMIN (GLUCOPHAGE) 1000 MG tablet Take 1 tablet (1,000 mg total) by mouth 2 (two) times daily with a meal. 11/25/19  Yes Roma Schanz R, DO  metoprolol tartrate (LOPRESSOR) 25 MG tablet Take 25 mg by mouth every evening.   Yes [provider]  ondansetron (ZOFRAN) 4 MG tablet Take 1 tablet (4 mg total) by mouth every 8 (eight) hours as needed for nausea or vomiting. 09/27/20  Yes Debbrah Alar, NP  polyethylene glycol (MIRALAX / GLYCOLAX) 17 g packet Take 17 g by mouth daily. Patient  taking differently: Take 17 g by mouth daily as needed (constipation). 10/12/19  Yes Angiulli, Lavon Paganini, PA-C  acetaminophen (TYLENOL) 325 MG tablet Take 2 tablets (650 mg total) by mouth every 6 (six) hours as needed for mild pain or headache. Patient not taking: No sig reported 07/06/18   Nani Skillern, PA-C  Amino Acids-Protein Hydrolys (FEEDING SUPPLEMENT, PRO-STAT 64,) LIQD Take 30 mLs by mouth 3 (three) times daily with meals. Patient not taking: No sig reported 07/04/20   Suzan Slick, NP  aspirin EC 81 MG EC tablet Take 1 tablet (81 mg total) by mouth daily. Swallow whole. Patient not taking: No sig reported 08/03/20   Mercy Riding, MD  atorvastatin (LIPITOR) 80 MG tablet Take 1 tablet (80 mg total) by mouth daily at 6 PM. Patient not taking: No sig reported 10/11/19   Angiulli, Lavon Paganini, PA-C  azelastine (ASTELIN) 0.1 % nasal spray Place 2 sprays into both nostrils 2 (two) times daily. Use in each nostril as directed Patient not taking: No sig reported 01/23/20   Mosie Lukes, MD  blood glucose meter kit and supplies Dispense based on patient and insurance preference. Use up to four times daily as directed. DX E11.59 11/25/19   Mosie Lukes, MD  docusate sodium (COLACE) 100 MG capsule Take 1 capsule (100 mg total) by mouth 2 (two) times daily. Patient not taking: No sig reported 01/13/19   Angiulli, Lavon Paganini, PA-C  levothyroxine (SYNTHROID) 25 MCG tablet TAKE 1 TABLET (25 MCG TOTAL) BY MOUTH DAILY BEFORE BREAKFAST. Patient not taking: No sig reported 01/13/19   Angiulli, Lavon Paganini, PA-C  metoCLOPramide (REGLAN) 5 MG tablet Take 1 tablet (5 mg total) by mouth 4 (four) times daily -  before meals and at bedtime. Patient not taking: No sig reported 01/13/19   Angiulli, Lavon Paganini, PA-C  Multiple Vitamin (MULTIVITAMIN WITH MINERALS) TABS tablet Take 1 tablet by mouth daily. Patient not taking: No sig reported 01/14/19   Angiulli, Lavon Paganini, PA-C  nystatin cream (MYCOSTATIN) Apply 1  application topically 2 (two) times daily. Patient not taking: No sig reported 03/30/20   Mosie Lukes, MD  pantoprazole (PROTONIX) 40 MG tablet Take 1 tablet (40 mg total) by mouth daily. Patient not taking: No sig reported 10/11/19   Angiulli, Lavon Paganini, PA-C    Allergies    Lyrica [pregabalin], Morphine and related, Penicillins, and Tramadol  Review of Systems   Review of Systems  Constitutional: Negative for chills and fever.  HENT: Negative for ear pain and sore throat.   Eyes: Negative for pain and visual disturbance.  Respiratory: Positive for cough, choking and shortness of breath.   Cardiovascular: Negative for chest pain and palpitations.  Gastrointestinal: Positive for vomiting. Negative for abdominal pain.  Genitourinary: Negative for dysuria and hematuria.  Musculoskeletal: Negative for arthralgias and back pain.  Skin: Negative for color change and rash.  Neurological: Positive for weakness. Negative for seizures and syncope.  All other systems reviewed and are negative.   Physical Exam Updated Vital Signs BP (!) 200/120   Pulse (!) 123   Temp (!) 101.5 F (38.6 C) (Rectal)   Resp 14   Ht 5' 2"  (1.575 m)   Wt 72 kg   LMP 02/12/2011 (Exact Date)   SpO2 (!) 85%   BMI 29.03 kg/m   Physical Exam Vitals and nursing note reviewed.  Constitutional:      General: She is not in acute distress.    Appearance: She is well-developed.  HENT:     Head: Normocephalic and atraumatic.  Eyes:     Conjunctiva/sclera: Conjunctivae normal.  Cardiovascular:     Rate and Rhythm: Normal rate and regular rhythm.     Heart sounds: No murmur heard.   Pulmonary:     Effort: Pulmonary effort is normal. No respiratory distress.     Breath sounds: Normal breath sounds.  Abdominal:     General: There is no distension.     Palpations: Abdomen is soft.  Tenderness: There is no abdominal tenderness. There is no right CVA tenderness or left CVA tenderness.  Musculoskeletal:         General: No swelling or tenderness. Normal range of motion.     Cervical back: Neck supple.  Skin:    General: Skin is warm and dry.  Neurological:     General: No focal deficit present.     Mental Status: She is alert and oriented to person, place, and time. Mental status is at baseline.     Cranial Nerves: No cranial nerve deficit.     ED Results / Procedures / Treatments   Labs (all labs ordered are listed, but only abnormal results are displayed) Labs Reviewed  CBC WITH DIFFERENTIAL/PLATELET - Abnormal; Notable for the following components:      Result Value   WBC 14.4 (*)    Hemoglobin 11.3 (*)    HCT 35.7 (*)    Platelets 617 (*)    Neutro Abs 12.0 (*)    All other components within normal limits  COMPREHENSIVE METABOLIC PANEL - Abnormal; Notable for the following components:   Glucose, Bld 208 (*)    BUN 23 (*)    Creatinine, Ser 1.16 (*)    Albumin 2.7 (*)    GFR, Estimated 55 (*)    All other components within normal limits  RESP PANEL BY RT-PCR (FLU A&B, COVID) ARPGX2  CULTURE, BLOOD (ROUTINE X 2)  CULTURE, BLOOD (ROUTINE X 2)  LIPASE, BLOOD  LACTIC ACID, PLASMA  LACTIC ACID, PLASMA    EKG EKG Interpretation  Date/Time:  Friday December 28 2020 16:22:30 EDT Ventricular Rate:  108 PR Interval:  139 QRS Duration: 78 QT Interval:  322 QTC Calculation: 432 R Axis:   56 Text Interpretation: Sinus tachycardia Probable LVH with secondary repol abnrm Confirmed by Quintella Reichert [phone]) on 12/28/2020 4:24:04 PM   Radiology CT Head Wo Contrast  Result Date: 12/28/2020 CLINICAL DATA:  Mental status change, unknown cause. EXAM: CT HEAD WITHOUT CONTRAST TECHNIQUE: Contiguous axial images were obtained from the base of the skull through the vertex without intravenous contrast. COMPARISON:  Prior head CT examination 07/28/2020. Prior brain MRI 07/28/2020. FINDINGS: Brain: Mild cerebral and cerebellar atrophy. Chronic cortical/subcortical infarct within the right  parietal and occipital lobes (PCA vascular territory). Associated ex vacuo dilatation of the posterior right lateral ventricle. Chronic infarct within the right corona radiata/basal ganglia. Chronic infarct within the right thalamus. Background mild patchy and ill-defined hypoattenuation within the cerebral white matter, nonspecific but compatible with chronic small vessel ischemic disease. There is a lacunar infarct within the left pons and left pontomesencephalic junction, which is new as compared to the head CT of 07/28/2020 but otherwise age indeterminate. Additional chronic lacunar infarct within the central pons. A small chronic infarct is also questioned within the left cerebellum. There is no acute intracranial hemorrhage. No demarcated cortical infarct. No extra-axial fluid collection. No evidence of intracranial mass. No midline shift. Vascular: No hyperdense vessel.  Atherosclerotic calcifications. Skull: Normal. Negative for fracture or focal lesion. Sinuses/Orbits: Visualized orbits show no acute finding. Trace right maxillary sinus mucosal thickening. IMPRESSION: A lacunar infarct within the left pons and pontomesencephalic junction is new from the prior head CT of 07/28/2020, but otherwise age indeterminate. Consider a brain MRI for further evaluation. Stable background generalized parenchymal atrophy and chronic ischemic disease with multiple chronic infarcts, as described. Electronically Signed   By: Kellie Simmering DO   On: 12/28/2020 18:37   DG Chest [facility]  1 View  Result Date: 12/28/2020 CLINICAL DATA:  Choking. EXAM: PORTABLE CHEST 1 VIEW COMPARISON:  07/28/2020 FINDINGS: Sequelae of CABG are again identified. The cardiomediastinal silhouette is within normal limits. The lungs are better inflated than on the prior study. No confluent airspace opacity, edema, pleural effusion, pneumothorax is identified. No acute osseous abnormality is seen. IMPRESSION: No active disease. Electronically Signed    By: Logan Bores M.D.   On: 12/28/2020 17:15    Procedures Procedures   Medications Ordered in ED Medications  cefTRIAXone (ROCEPHIN) 1 g in sodium chloride 0.9 % 100 mL IVPB (has no administration in time range)  acetaminophen (TYLENOL) tablet 650 mg (has no administration in time range)  losartan (COZAAR) tablet 50 mg (has no administration in time range)  lactated ringers bolus 500 mL (500 mLs Intravenous New Bag/Given 12/28/20 2116)  metroNIDAZOLE (FLAGYL) IVPB 500 mg (500 mg Intravenous New Bag/Given 12/28/20 2125)    ED Course  I have reviewed the triage vital signs and the nursing notes.  Pertinent labs & imaging results that were available during my care of the patient were reviewed by me and considered in my medical decision making (see chart for details).    MDM Rules/Calculators/A&P                          Patient is a 55 year old female with extensive medical history present with chief complaint of choking episode.  Patient had a vomiting episode and ended up choking.  Concern for aspiration event based on history of present illness and physical exam findings.  Proceed with screening laboratory evaluation including screening labs and CT head.  CT head with lacunar infarct.  Discussed with neurology who recommended MRI head when able.  Patient having issues with severe secretions and required frequent suctioning in emergency department.  Unable to proceed with MRI at this time.  Frequent suctioning results and stable vital signs.  If she is unattended she has further gurgling.  Screen laboratory evaluation initiated frequently suctioned in emergency department.  Favor patient symptoms are more global but given her dysphagia and aspiration event today, aspiration pneumonitis is a possibility.  Will initiate treatment with antibiotics and recommended admission to medicine for further care and management and aggressive respiratory interventions.. Final Clinical Impression(s) / ED  Diagnoses Final diagnoses:  Pneumonia due to infectious organism, unspecified laterality, unspecified part of lung    Rx / DC Orders ED Discharge Orders    None       Tretha Sciara, MD 12/28/20 Darci Needle    Quintella Reichert, MD 12/29/20 1212

## 2020-12-29 ENCOUNTER — Inpatient Hospital Stay (HOSPITAL_COMMUNITY): Payer: BC Managed Care – PPO

## 2020-12-29 DIAGNOSIS — R0602 Shortness of breath: Secondary | ICD-10-CM

## 2020-12-29 DIAGNOSIS — I6381 Other cerebral infarction due to occlusion or stenosis of small artery: Secondary | ICD-10-CM

## 2020-12-29 LAB — CBC
HCT: 33.1 % — ABNORMAL LOW (ref 36.0–46.0)
Hemoglobin: 10.5 g/dL — ABNORMAL LOW (ref 12.0–15.0)
MCH: 27.6 pg (ref 26.0–34.0)
MCHC: 31.7 g/dL (ref 30.0–36.0)
MCV: 86.9 fL (ref 80.0–100.0)
Platelets: 574 10*3/uL — ABNORMAL HIGH (ref 150–400)
RBC: 3.81 MIL/uL — ABNORMAL LOW (ref 3.87–5.11)
RDW: 14.4 % (ref 11.5–15.5)
WBC: 17.6 10*3/uL — ABNORMAL HIGH (ref 4.0–10.5)
nRBC: 0 % (ref 0.0–0.2)

## 2020-12-29 LAB — BASIC METABOLIC PANEL
Anion gap: 15 (ref 5–15)
BUN: 23 mg/dL — ABNORMAL HIGH (ref 6–20)
CO2: 25 mmol/L (ref 22–32)
Calcium: 8.7 mg/dL — ABNORMAL LOW (ref 8.9–10.3)
Chloride: 102 mmol/L (ref 98–111)
Creatinine, Ser: 1.19 mg/dL — ABNORMAL HIGH (ref 0.44–1.00)
GFR, Estimated: 53 mL/min — ABNORMAL LOW (ref >60)
Glucose, Bld: 286 mg/dL — ABNORMAL HIGH (ref 70–99)
Potassium: 3.4 mmol/L — ABNORMAL LOW (ref 3.5–5.1)
Sodium: 142 mmol/L (ref 135–145)

## 2020-12-29 LAB — GLUCOSE, CAPILLARY
Glucose-Capillary: 144 mg/dL — ABNORMAL HIGH (ref 70–99)
Glucose-Capillary: 150 mg/dL — ABNORMAL HIGH (ref 70–99)
Glucose-Capillary: 164 mg/dL — ABNORMAL HIGH (ref 70–99)
Glucose-Capillary: 290 mg/dL — ABNORMAL HIGH (ref 70–99)
Glucose-Capillary: 292 mg/dL — ABNORMAL HIGH (ref 70–99)

## 2020-12-29 LAB — D-DIMER, QUANTITATIVE: D-Dimer, Quant: 3.09 ug/mL-FEU — ABNORMAL HIGH (ref 0.00–0.50)

## 2020-12-29 LAB — MAGNESIUM: Magnesium: 1.4 mg/dL — ABNORMAL LOW (ref 1.7–2.4)

## 2020-12-29 LAB — HIV ANTIBODY (ROUTINE TESTING W REFLEX): HIV Screen 4th Generation wRfx: NONREACTIVE

## 2020-12-29 LAB — PROCALCITONIN: Procalcitonin: 12.16 ng/mL

## 2020-12-29 LAB — LACTIC ACID, PLASMA: Lactic Acid, Venous: 1.5 mmol/L (ref 0.5–1.9)

## 2020-12-29 MED ORDER — THIAMINE HCL 100 MG/ML IJ SOLN
100.0000 mg | Freq: Every day | INTRAMUSCULAR | Status: DC
  Administered 2020-12-30 – 2020-12-31 (×2): 100 mg via INTRAVENOUS
  Filled 2020-12-29 (×2): qty 2

## 2020-12-29 MED ORDER — INSULIN ASPART 100 UNIT/ML ~~LOC~~ SOLN: 0.0000 [IU] | Freq: Three times a day (TID) | SUBCUTANEOUS | Status: DC

## 2020-12-29 MED ORDER — ACETAMINOPHEN 650 MG RE SUPP
650.0000 mg | Freq: Four times a day (QID) | RECTAL | Status: DC | PRN
  Administered 2020-12-29: 650 mg via RECTAL
  Filled 2020-12-29: qty 1

## 2020-12-29 MED ORDER — MAGNESIUM SULFATE 4 GM/100ML IV SOLN
4.0000 g | Freq: Once | INTRAVENOUS | Status: AC
  Administered 2020-12-29: 4 g via INTRAVENOUS
  Filled 2020-12-29: qty 100

## 2020-12-29 MED ORDER — LACTATED RINGERS IV SOLN: INTRAVENOUS | Status: DC

## 2020-12-29 MED ORDER — CYANOCOBALAMIN 1000 MCG/ML IJ SOLN
1000.0000 ug | Freq: Every day | INTRAMUSCULAR | Status: DC
  Administered 2020-12-30 – 2021-01-04 (×6): 1000 ug via INTRAMUSCULAR
  Filled 2020-12-29 (×6): qty 1

## 2020-12-29 MED ORDER — INSULIN ASPART 100 UNIT/ML ~~LOC~~ SOLN
0.0000 [IU] | Freq: Four times a day (QID) | SUBCUTANEOUS | Status: DC
  Administered 2020-12-29: 8 [IU] via SUBCUTANEOUS
  Administered 2020-12-29 – 2020-12-30 (×2): 2 [IU] via SUBCUTANEOUS
  Administered 2020-12-30: 3 [IU] via SUBCUTANEOUS
  Administered 2020-12-31 – 2021-01-02 (×2): 2 [IU] via SUBCUTANEOUS
  Administered 2021-01-02: 3 [IU] via SUBCUTANEOUS

## 2020-12-29 MED ORDER — POTASSIUM CHLORIDE 10 MEQ/100ML IV SOLN
10.0000 meq | INTRAVENOUS | Status: AC
  Administered 2020-12-29 (×3): 10 meq via INTRAVENOUS
  Filled 2020-12-29 (×3): qty 100

## 2020-12-29 MED ORDER — IOHEXOL 300 MG/ML  SOLN
50.0000 mL | Freq: Once | INTRAMUSCULAR | Status: AC | PRN
  Administered 2020-12-29: 50 mL via INTRAVENOUS

## 2020-12-29 MED ORDER — LABETALOL HCL 5 MG/ML IV SOLN: 5.0000 mg | INTRAVENOUS | Status: DC | PRN

## 2020-12-29 MED ORDER — CYANOCOBALAMIN 1000 MCG/ML IJ SOLN: 1000.0000 ug | Freq: Every day | INTRAMUSCULAR | Status: DC

## 2020-12-29 MED ORDER — SODIUM CHLORIDE 0.9 % IV SOLN
3.0000 g | Freq: Three times a day (TID) | INTRAVENOUS | Status: DC
  Administered 2020-12-29 – 2021-01-04 (×18): 3 g via INTRAVENOUS
  Filled 2020-12-29: qty 8
  Filled 2020-12-29 (×5): qty 3
  Filled 2020-12-29 (×3): qty 8
  Filled 2020-12-29 (×2): qty 3
  Filled 2020-12-29 (×3): qty 8
  Filled 2020-12-29: qty 3
  Filled 2020-12-29 (×2): qty 8
  Filled 2020-12-29: qty 3
  Filled 2020-12-29: qty 8
  Filled 2020-12-29 (×3): qty 3

## 2020-12-29 NOTE — Progress Notes (Signed)
VASCULAR LAB    Bilateral lower extremity venous duplex has been performed.  See CV proc for preliminary results.   Samreen Seltzer, RVT 12/29/2020, 12:26 PM

## 2020-12-29 NOTE — Progress Notes (Addendum)
PROGRESS NOTE  [name] VWU:[id number] DOB: 02/29/1966 DOA: 12/28/2020 PCP: Bradd CanaryBlyth, Stacey A, MD  HPI/Recap of past 24 hours: Tiffany KannerDonna S Davis is a 55 y.o. female with medical history significant for DMT2 with vascular disease, HTN, Hx of ischemic CVA, hx of bilateral BKA, hx of intracranial hemorrhage in 2017, CAD, hypothyroidism who lives with her son who is also her caregiver. She is bedbound.  Reportedly patient was slow to awaken and get out of bed this morning.  Her son took her on the porch to sit in the sun which is not uncommon for him to do with her when she started to cough and had increasing shortness of breath.  She reportedly vomited some nonbloody emesis.  Her son had gone in to get a drink when he came back out and found her gurgling and called EMS.  When they arrived they did suction her and got a significant amount of thick yellowish phlegm from the back of her throat.  She was noted to have oxygen saturation around 85% at that time.  She does not use oxygen at home.  The son reportedly told the ER physician that patient has had increasing difficulty with swallowing and coughing and gagging when she eats over the last few months but has not had this evaluated.  Reportedly at home she does not have any fevers but in the emergency room she had temperature to 101.6 degrees.  No recent travel.  In the emergency room patient is noted to have temperature to 101.6 degrees and elevated white blood cell count greater than 14,000.  CT the head was obtained which showed a lacunar infarct of unknown age.  ER physician discussed with neurology who recommended MRI of the brain and they would see the patient after MRI is obtained.  Patient has had witnessed coughing and gagging with aspiration in the emergency room, was started on antibiotic empirically for aspiration pneumonia.  CT of the chest with contrast done on 12/29/2020 consistent with multifocal pneumonia, possible small right lower lobe  although false-positive from artifact is definitely possible.  Per radiologist report consider venous work-up or follow-up chest CT.  12/29/20: Seen and examined at her bedside.  She is hypersomnolent and minimally responsive.  Oxygen saturation 96% on 4 L in the room.  Not on oxygen supplementation at baseline.  Assessment/Plan: Principal Problem:   Aspiration pneumonia (HCC) Active Problems:   Essential hypertension, benign   Anemia of chronic disease   Type 2 diabetes mellitus with vascular disease (HCC)   S/P bilateral BKA (below knee amputation) (HCC)   Lacunar infarction (HCC)   Thrombocytosis   Respiratory distress   Acute metabolic encephalopathy, suspect multifactorial secondary to acute illness, aspiration pneumonia, acute hypoxemia. Onset at home prior to presentation. Concern for aspiration and aspiration pneumonia. Hypoxemic on presentation O2 saturation in the mid 80s on room air. Not on oxygen supplementation at baseline. Reorient as needed Avoid any sedating agents.  Sepsis secondary to aspiration pneumonia, POA Presented with leukocytosis, tachycardia and tachypnea.  With evidence of aspiration pneumonia on CT chest. Witnessed coughing and gagging in the ER with concern for aspiration. CT chest done on 12/29/2020 with evidence of aspiration pneumonia. She was started on Unasyn empirically in the ED, continue.  Pulmonary Speech therapist evaluation once stable Aspiration precautions Follow cultures  Possible right-sided PE Obtain bilateral lower extremity Doppler ultrasound. If renal function allows may consider CTA chest to confirm Follow results. Neurology consulted anticoagulation recommendations due to the patient's  history of intracranial hemorrhage.  Acute hypoxic respiratory failure secondary to aspiration pneumonia Not on oxygen supplementation at baseline Currently on 4 L O2 saturation in the mid 90s Continue to maintain O2 saturation greater than  92%.  History of intracranial hemorrhage in 2017 Since then she has had recurrent ischemic CVA Neurology consulted and recommended to repeat an MRI brain on 12/29/2020, pending. Continue neurochecks frequently  Hypokalemia Replete and repeat levels as indicated.  Type 2 diabetes with hyperglycemia Update A1c Cover with subcu insulin.  CKD 3 Appears to be at her baseline creatinine with GFR 53. Avoid nephrotoxic agents Monitor urine output Repeat renal panel in the morning.  Bilateral below the knee amputation Continue fall precautions Frequent turning  Goals of care  Palliative care team consulted to assist with establishing goals of care. Currently the patient is full code  Code Status: Full code  Family Communication: None at bedside.  Disposition Plan: Likely will discharge to home with home health services on 12/31/2020 or when neurology signs of.   Consultants:  Neurology  Procedures:  None.  Antimicrobials:  Unasyn.  DVT prophylaxis: Subcu Lovenox daily.  Status is: Inpatient   Dispo: The patient is from: Home.               Anticipated d/c is to: Likely home with home health services on 12/31/2020 or when neurology signs off.              Patient currently, not stable for discharge due to ongoing management of aspiration pneumonia, acute hypoxic respiratory failure.   Difficult to place patient, not applicable.        Objective: Vitals:   12/29/20 0300 12/29/20 0400 12/29/20 0500 12/29/20 0600  BP: 135/84 116/82 111/76 114/77  Pulse: (!) 127 (!) 109 100 94  Resp: (!) 25 (!) 33 (!) 27 (!) 27  Temp: 99 F (37.2 C) 98.8 F (37.1 C)  99 F (37.2 C)  TempSrc: Axillary Axillary Axillary Axillary  SpO2: 100% 100% 100% 100%  Weight:      Height:       No intake or output data in the 24 hours ending 12/29/20 1032 Filed Weights   12/28/20 1937  Weight: 72 kg    Exam:  . General: 55 y.o. year-old female well developed well nourished in  no acute distress.  Somnolent, minimally interactive. . Cardiovascular: Regular rate and rhythm with no rubs or gallops.  No thyromegaly or JVD noted.   Marland Kitchen Respiratory: Diffuse rales bilaterally with poor inspiratory effort.  Abdomen: Soft nontender nondistended with normal bowel sounds x4 quadrants. . Musculoskeletal: Bilateral below the knee amputation. . Skin: No ulcerative lesions noted or rashes . Psychiatry: Unable to assess mood due to hypersomnolence.   Data Reviewed: CBC: Recent Labs  Lab 12/25/20 1055 12/28/20 2011 12/29/20 0024  WBC 8.7 14.4* 17.6*  NEUTROABS  --  12.0*  --   HGB 10.9* 11.3* 10.5*  HCT 33.8* 35.7* 33.1*  MCV 84.0 87.9 86.9  PLT 613.0* 617* 123456*   Basic Metabolic Panel: Recent Labs  Lab 12/25/20 1055 12/28/20 2011 12/29/20 0024  NA 142 143 142  K 3.7 3.6 3.4*  CL 100 103 102  CO2 32 29 25  GLUCOSE 158* 208* 286*  BUN 18 23* 23*  CREATININE 1.03 1.16* 1.19*  CALCIUM 9.2 9.0 8.7*   GFR: Estimated Creatinine Clearance: 48.5 mL/min (A) (by C-G formula based on SCr of 1.19 mg/dL (H)). Liver Function Tests: Recent Labs  Lab 12/25/20  1055 12/28/20 2011  AST 11 21  ALT 8 16  ALKPHOS 93 81  BILITOT 0.5 1.0  PROT 6.9 7.2  ALBUMIN 3.1* 2.7*   Recent Labs  Lab 12/28/20 2011  LIPASE 31   No results for input(s): AMMONIA in the last 168 hours. Coagulation Profile: No results for input(s): INR, PROTIME in the last 168 hours. Cardiac Enzymes: No results for input(s): CKTOTAL, CKMB, CKMBINDEX, TROPONINI in the last 168 hours. BNP (last 3 results) No results for input(s): PROBNP in the last 8760 hours. HbA1C: No results for input(s): HGBA1C in the last 72 hours. CBG: Recent Labs  Lab 12/29/20 0028 12/29/20 0601  GLUCAP 290* 292*   Lipid Profile: No results for input(s): CHOL, HDL, LDLCALC, TRIG, CHOLHDL, LDLDIRECT in the last 72 hours. Thyroid Function Tests: No results for input(s): TSH, T4TOTAL, FREET4, T3FREE, THYROIDAB in the last  72 hours. Anemia Panel: No results for input(s): VITAMINB12, FOLATE, FERRITIN, TIBC, IRON, RETICCTPCT in the last 72 hours. Urine analysis:    Component Value Date/Time   COLORURINE YELLOW 07/28/2020 0300   APPEARANCEUR HAZY (A) 07/28/2020 0300   LABSPEC 1.020 07/28/2020 0300   PHURINE 7.0 07/28/2020 0300   GLUCOSEU >=500 (A) 07/28/2020 0300   GLUCOSEU NEGATIVE 05/21/2015 0831   HGBUR NEGATIVE 07/28/2020 0300   BILIRUBINUR NEGATIVE 07/28/2020 0300   KETONESUR NEGATIVE 07/28/2020 0300   PROTEINUR >=300 (A) 07/28/2020 0300   UROBILINOGEN 0.2 05/21/2015 0831   NITRITE NEGATIVE 07/28/2020 0300   LEUKOCYTESUR NEGATIVE 07/28/2020 0300   Sepsis Labs: @LABRCNTIP (procalcitonin:4,lacticidven:4)  ) Recent Results (from the past 240 hour(s))  Resp Panel by RT-PCR (Flu A&B, Covid) Nasopharyngeal Swab     Status: None   Collection Time: 12/28/20  9:09 PM   Specimen: Nasopharyngeal Swab; Nasopharyngeal(NP) swabs in vial transport medium  Result Value Ref Range Status   SARS Coronavirus 2 by RT PCR NEGATIVE NEGATIVE Final    Comment: (NOTE) SARS-CoV-2 target nucleic acids are NOT DETECTED.  The SARS-CoV-2 RNA is generally detectable in upper respiratory specimens during the acute phase of infection. The lowest concentration of SARS-CoV-2 viral copies this assay can detect is 138 copies/mL. A negative result does not preclude SARS-Cov-2 infection and should not be used as the sole basis for treatment or other patient management decisions. A negative result may occur with  improper specimen collection/handling, submission of specimen other than nasopharyngeal swab, presence of viral mutation(s) within the areas targeted by this assay, and inadequate number of viral copies(<138 copies/mL). A negative result must be combined with clinical observations, patient history, and epidemiological information. The expected result is Negative.  Fact Sheet for Patients:   EntrepreneurPulse.com.au  Fact Sheet for Healthcare Providers:  IncredibleEmployment.be  This test is no t yet approved or cleared by the [place] FDA and  has been authorized for detection and/or diagnosis of SARS-CoV-2 by FDA under an Emergency Use Authorization (EUA). This EUA will remain  in effect (meaning this test can be used) for the duration of the COVID-19 declaration under Section 564(b)(1) of the Act, 21 U.S.C.section 360bbb-3(b)(1), unless the authorization is terminated  or revoked sooner.       Influenza A by PCR NEGATIVE NEGATIVE Final   Influenza B by PCR NEGATIVE NEGATIVE Final    Comment: (NOTE) The Xpert Xpress SARS-CoV-2/FLU/RSV plus assay is intended as an aid in the diagnosis of influenza from Nasopharyngeal swab specimens and should not be used as a sole basis for treatment. Nasal washings and aspirates are unacceptable for Xpert  Xpress SARS-CoV-2/FLU/RSV testing.  Fact Sheet for Patients: EntrepreneurPulse.com.au  Fact Sheet for Healthcare Providers: IncredibleEmployment.be  This test is not yet approved or cleared by the [place] FDA and has been authorized for detection and/or diagnosis of SARS-CoV-2 by FDA under an Emergency Use Authorization (EUA). This EUA will remain in effect (meaning this test can be used) for the duration of the COVID-19 declaration under Section 564(b)(1) of the Act, 21 U.S.C. section 360bbb-3(b)(1), unless the authorization is terminated or revoked.  Performed at [facility], [place] [address], [place], [place] [address]   Blood culture (routine x 2)     Status: None (Preliminary result)   Collection Time: 12/28/20  9:09 PM   Specimen: BLOOD  Result Value Ref Range Status   Specimen Description BLOOD RIGHT ANTECUBITAL  Final   Special Requests   Final    BOTTLES DRAWN AEROBIC AND ANAEROBIC Blood Culture results may not be optimal due  to an excessive volume of blood received in culture bottles   Culture   Final    NO GROWTH < 12 HOURS Performed at [facility], [place] [address] [place], [place] [address]    Report Status PENDING  Incomplete  Blood culture (routine x 2)     Status: None (Preliminary result)   Collection Time: 12/28/20  9:09 PM   Specimen: BLOOD RIGHT FOREARM  Result Value Ref Range Status   Specimen Description BLOOD RIGHT FOREARM  Final   Special Requests   Final    BOTTLES DRAWN AEROBIC AND ANAEROBIC Blood Culture results may not be optimal due to an excessive volume of blood received in culture bottles   Culture   Final    NO GROWTH < 12 HOURS Performed at [facility], [place] [address] [place], [place] [address]    Report Status PENDING  Incomplete      Studies: CT Head Wo Contrast  Result Date: 12/28/2020 CLINICAL DATA:  Mental status change, unknown cause. EXAM: CT HEAD WITHOUT CONTRAST TECHNIQUE: Contiguous axial images were obtained from the base of the skull through the vertex without intravenous contrast. COMPARISON:  Prior head CT examination 07/28/2020. Prior brain MRI 07/28/2020. FINDINGS: Brain: Mild cerebral and cerebellar atrophy. Chronic cortical/subcortical infarct within the right parietal and occipital lobes (PCA vascular territory). Associated ex vacuo dilatation of the posterior right lateral ventricle. Chronic infarct within the right corona radiata/basal ganglia. Chronic infarct within the right thalamus. Background mild patchy and ill-defined hypoattenuation within the cerebral white matter, nonspecific but compatible with chronic small vessel ischemic disease. There is a lacunar infarct within the left pons and left pontomesencephalic junction, which is new as compared to the head CT of 07/28/2020 but otherwise age indeterminate. Additional chronic lacunar infarct within the central pons. A small chronic infarct is also questioned within the left cerebellum. There is no  acute intracranial hemorrhage. No demarcated cortical infarct. No extra-axial fluid collection. No evidence of intracranial mass. No midline shift. Vascular: No hyperdense vessel.  Atherosclerotic calcifications. Skull: Normal. Negative for fracture or focal lesion. Sinuses/Orbits: Visualized orbits show no acute finding. Trace right maxillary sinus mucosal thickening. IMPRESSION: A lacunar infarct within the left pons and pontomesencephalic junction is new from the prior head CT of 07/28/2020, but otherwise age indeterminate. Consider a brain MRI for further evaluation. Stable background generalized parenchymal atrophy and chronic ischemic disease with multiple chronic infarcts, as described. Electronically Signed   By: Kellie Simmering DO   On: 12/28/2020 18:37  CT Chest W Contrast  Result Date: 12/29/2020 CLINICAL DATA:  Chest pain or shortness of breath.  Leukocytosis. EXAM: CT CHEST WITH CONTRAST TECHNIQUE: Multidetector CT imaging of the chest was performed during intravenous contrast administration. CONTRAST:  59mL OMNIPAQUE IOHEXOL 300 MG/ML  SOLN COMPARISON:  None. FINDINGS: A noncontrast phase was inadvertently obtained due to IV tubing failure. Cardiovascular: Normal heart size. No pericardial effusion. Prominent atheromatous changes for age. CABG. At the segmental right lower lobe level there is a possible branching filling defect within the pulmonary arterial tree. There are additional areas of pulmonary artery low-density that are clearly from artifact. Mediastinum/Nodes: No adenopathy or mass. Lungs/Pleura: Airspace disease affecting all lobes. Mild interlobular septal thickening at the bases, but not a dominant finding. No pleural effusion or pneumothorax. Upper Abdomen: Cholelithiasis layering within the gallbladder. Musculoskeletal: No acute finding. A page has been placed to the attending physician. Reference Z vision dashboard. IMPRESSION: 1. Multifocal pneumonia. 2. Possible small right lower  lobe pulmonary embolism, although false-positive from artifact is definitely possible. Consider venous workup or follow-up chest CTA. 3. Aortic Atherosclerosis (ICD10-I70.0). 4. Cholelithiasis. Electronically Signed   By: Monte Fantasia M.D.   On: 12/29/2020 09:39   DG Chest Port 1 View  Result Date: 12/28/2020 CLINICAL DATA:  Choking. EXAM: PORTABLE CHEST 1 VIEW COMPARISON:  07/28/2020 FINDINGS: Sequelae of CABG are again identified. The cardiomediastinal silhouette is within normal limits. The lungs are better inflated than on the prior study. No confluent airspace opacity, edema, pleural effusion, pneumothorax is identified. No acute osseous abnormality is seen. IMPRESSION: No active disease. Electronically Signed   By: Logan Bores M.D.   On: 12/28/2020 17:15    Scheduled Meds: . acetaminophen  650 mg Oral Once  . insulin aspart  0-15 Units Subcutaneous Q6H  . losartan  50 mg Oral Once    Continuous Infusions: . ampicillin-sulbactam (UNASYN) IV 3 g (12/29/20 0420)  . lactated ringers 75 mL/hr at 12/29/20 0315     LOS: 1 day     Kayleen Memos, MD [facility] Pager [phone]  If 7PM-7AM, please contact night-coverage [email or web address] Password [facility] 12/29/2020, 10:32 AM

## 2020-12-29 NOTE — Therapy (Signed)
Called by the nurse to assess pt for desaturation pt was placed on 12L HFNC by the nurse before my arrival. Pt saturation is currently 100% with no distress or increased WOB noted. Will continue to monitor.

## 2020-12-29 NOTE — Consult Note (Addendum)
Neurology Consultation Reason for Consult: Safety of anticoagulation in the setting of potential recent stroke Requesting Physician: Irene Pap   CC: Fatigue, hypoxia  History is obtained from: Chart review mostly with small contribution from patient, unable to reach family  HPI: GEORGENA WEISHEIT is a 55 y.o. female with significant vascular disease (bilateral BKA, coronary artery disease, multiple prior strokes), diabetes, hypertension, hyperlipidemia, obesity, hypothyroidism.  She is bedbound at baseline.  Per notes in the chart, she presented on 4/22 due to being slow to awaken in the morning, and then having cough and increased shortness of breath, severe aspiration event, emesis (nonbloody, yellow fluid), requiring suctioning on EMS arrival with an O2 saturation of 85%.  She was febrile to 101.6 in the ED, with a leukocytosis to 14 and age indeterminant infarct of the left pons (new since November 2021) seen on head CT.  She was initially normotensive but then became quite hypertensive, with significant lability of her blood pressure today. She has been having choking episodes for months, possibly gradually progressive per chart review   She was started on antibiotics for presumed aspiration pneumonia. CT of the chest with contrast additionally demonstrated a possible small right lower lobe pulmonary embolism though false positive from artifact cannot be excluded and venous work-up or follow-up chest CTA was recommended for further work-up.  Overnight she was tachypneic, following simple commands and nodding appropriately to some questions with a weak cough and gagging/emesis on coughing.  Naso tracheal suctioning was recommended  Regarding her stroke history, she was last here with altered mental status and stroke in November 2021, found to have a few small acute strokes in the distribution of the right PCA territory with a focal severe proximal right P2 stenosis on MRA head.  Due to mental  status out of proportion to her strokes she also received a lumbar puncture and a brief empiric course of antibiotics for potential meningitis although her mental status improved with holding Keppra and antibiotics.  Plan was for dual antiplatelet therapy for 3-week course.  Her son reports that since the patient's husband died about 2 years ago the patient has had a rapid decline with many health problems (cardiac, bilateral amputations etc.) He reports that she does not keep up with what the date is as she does not need to and that he has been caring for her at home as he does not want to put her in a nursing home.  She reports that she still recognizes him and her children but does not recognize of the people who do not visit as often.  He notes that even he has a great deal of difficulty understanding her needs are to repeat herself 2-3 times typically to be able to understand her speech.  He notes that she has been having worsening right arm weakness for at least a week or 2 and that he had seen their primary care physician about this recently and there is a potential plan for home rehab versus more aggressive therapy.  Regarding her ICH, per discharge summary from 04/01/2016 (MRI brain at the time personally reviewed) she had a small nondominant right frontoparietal ICH likely secondary to hypertensive source though the location is odd for hemorrhage.  She has some dysarthria and left-sided stiffness at baseline secondary to her prior strokes, in addition to being bedbound at baseline   LKW: Unclear, reportedly months of difficulty swallowing tPA given?: No, due to out of the window Premorbid modified rankin scale: 4-5  4 - Moderately severe disability. Unable to attend to own bodily needs without assistance, and unable to walk unassisted.     5 - Severe disability. Requires constant nursing care and attention, bedridden, incontinent.  ROS: Unable to obtain due to altered mental status /  dysarthria / severely hypophonic patient.  To me she reports she does have a headache, not much abdominal pain or chest pain.  I am unable to understand most of her other answers to my questions  Past Medical History:  Diagnosis Date  . Abscess of great toe, right   . Acute on chronic diastolic CHF (congestive heart failure) ([place]) 06/24/2018  . Acute osteomyelitis of toe, left ([place]) 09/05/2016  . Amputation of right great toe ([place]) 12/22/2018  . Anxiety   . Cataract    left - surgery to remove  . Cellulitis of foot, right 12/11/2018  . COMMON MIGRAINE 10/07/2010  . Coronary artery disease   . Decreased visual acuity 11/10/2016  . Depression 12/18/2012  . Diabetes mellitus type II, uncontrolled ([place]) 10/07/2010   Qualifier: Diagnosis of  By: Charlett Blake MD, [name]    . Diabetic foot infection ([place]) 08/26/2016  . Diabetic infection of right foot ([place])   . Disturbance of skin sensation 10/07/2010  . Gangrene of right foot ([place])   . GERD (gastroesophageal reflux disease)   . Heart murmur   . History of kidney stones    "years ago" - passed stones  . Hyperlipidemia 12/06/2010  . Hypertension   . Hypothyroidism   . Lipoma of abdominal wall 10/05/2016  . Overweight(278.02) 12/06/2010  . PERIPHERAL NEUROPATHY, FEET 10/07/2010   11/17/2019  . PVD (peripheral vascular disease) ([place]) 01/21/2012  . RESTLESS LEG SYNDROME 10/25/2010  . Stroke [facility]) 2014, 2017   most recently in 2/17 - intracerebral hemorrhage   Past Surgical History:  Procedure Laterality Date  . AMPUTATION Right 12/13/2018   Procedure: AMPUTATION RIGHT GREAT TOE, LOCAL RELOCATION OF TISSUE FOR WOUND CLOSURE 9cm x 3cm, VAC APPLICATION;  Surgeon: [name], MD;  Location: [place];  Service: Orthopedics;  Laterality: Right;  . AMPUTATION Right 12/31/2018   Procedure: RIGHT BELOW KNEE AMPUTATION;  Surgeon: [name], MD;  Location: [place];  Service: Orthopedics;  Laterality: Right;  . AMPUTATION Left 09/21/2019   Procedure: LEFT BELOW KNEE  AMPUTATION;  Surgeon: [name], MD;  Location: [place];  Service: Orthopedics;  Laterality: Left;  . AMPUTATION TOE Left 09/09/2016   Procedure: AMPUTATION OF LEFT GREAT TOE;  Surgeon: Milly Jakob, MD;  Location: [place];  Service: Orthopedics;  Laterality: Left;  . BELOW KNEE LEG AMPUTATION Left 09/21/2019  . CARDIAC CATHETERIZATION  06/24/2018  . CORONARY ARTERY BYPASS GRAFT N/A 06/28/2018   Procedure: CORONARY ARTERY BYPASS GRAFTING (CABG) times  four, using left internal mammary artery, endoscopically harvested right saphenous vein, and harvested left radial artery;  Surgeon: [name], MD;  Location: [place];  Service: Open Heart Surgery;  Laterality: N/A;  . ENDOVEIN HARVEST OF GREATER SAPHENOUS VEIN Right 06/28/2018   Procedure: ENDOVEIN HARVEST OF GREATER SAPHENOUS VEIN;  Surgeon: [name], MD;  Location: [place];  Service: Open Heart Surgery;  Laterality: Right;  . EYE SURGERY Left 06/2017   [facility]  . LEFT HEART CATH AND CORONARY ANGIOGRAPHY N/A 06/24/2018   Procedure: LEFT HEART CATH AND CORONARY ANGIOGRAPHY;  Surgeon: Troy Sine, MD;  Location: [place] [facility];  Service: Cardiovascular;  Laterality: N/A;  . RADIAL ARTERY  HARVEST Left 06/28/2018   Procedure: RADIAL ARTERY HARVEST;  Surgeon: [name], MD;  Location: [place];  Service: Open Heart Surgery;  Laterality: Left;  . STUMP REVISION Left 11/18/2019   Procedure: REVISION LEFT BELOW KNEE AMPUTATION;  Surgeon: [name], MD;  Location: [place];  Service: Orthopedics;  Laterality: Left;  . TEE WITHOUT CARDIOVERSION N/A 06/28/2018   Procedure: TRANSESOPHAGEAL ECHOCARDIOGRAM (TEE);  Surgeon: [name], MD;  Location: [place];  Service: Open Heart Surgery;  Laterality: N/A;  . WISDOM TOOTH EXTRACTION      Current Outpatient Medications  Medication Instructions  . acetaminophen (TYLENOL) 650 mg, Oral, Every 6 hours PRN  . Amino Acids-Protein Hydrolys (FEEDING  SUPPLEMENT, PRO-STAT 64,) LIQD 30 mLs, Oral, 3 times daily with meals  . amLODipine (NORVASC) 10 MG tablet TAKE 1 TABLET BY MOUTH EVERY DAY  . aspirin 81 mg, Oral, Daily, Swallow whole.  Marland Kitchen atorvastatin (LIPITOR) 80 mg, Oral, Daily-1800  . azelastine (ASTELIN) 0.1 % nasal spray 2 sprays, Each Nare, 2 times daily, Use in each nostril as directed  . blood glucose meter kit and supplies Dispense based on patient and insurance preference. Use up to four times daily as directed. DX E11.59  . docusate sodium (COLACE) 100 mg, Oral, 2 times daily  . escitalopram (LEXAPRO) 10 MG tablet TAKE 1 TABLET BY MOUTH EVERY DAY  . ferrous sulfate 325 (65 FE) MG EC tablet TAKE 1 TABLET BY MOUTH EVERY DAY WITH BREAKFAST  . levothyroxine (SYNTHROID) 25 MCG tablet TAKE 1 TABLET (25 MCG TOTAL) BY MOUTH DAILY BEFORE BREAKFAST.  Marland Kitchen losartan (COZAAR) 50 mg, Oral, Daily  . metFORMIN (GLUCOPHAGE) 1,000 mg, Oral, 2 times daily with meals  . metoCLOPramide (REGLAN) 5 mg, Oral, 3 times daily before meals & bedtime  . metoprolol tartrate (LOPRESSOR) 25 mg, Oral, Every evening  . Multiple Vitamin (MULTIVITAMIN WITH MINERALS) TABS tablet 1 tablet, Oral, Daily  . nystatin cream (MYCOSTATIN) 1 application, Topical, 2 times daily  . ondansetron (ZOFRAN) 4 mg, Oral, Every 8 hours PRN  . pantoprazole (PROTONIX) 40 mg, Oral, Daily  . polyethylene glycol (MIRALAX / GLYCOLAX) 17 g, Oral, Daily     Family History  Problem Relation Age of Onset  . Arthritis Mother   . Stroke Brother        previous smoker  . Alcohol abuse Brother        in remission  . Leukemia Brother   . Diabetes Paternal Grandmother   . Healthy Son     Social History:  reports that she quit smoking about 15 years ago. She smoked 0.50 packs per day. She has never used smokeless tobacco. She reports previous drug use. She reports that she does not drink alcohol.   Exam: Current vital signs: BP 114/77 (BP Location: Right Arm)   Pulse 94   Temp 99 F (37.2  C) (Axillary)   Resp (!) 27   Ht 5' 2"  (1.575 m)   Wt 72 kg   LMP 02/12/2011 (Exact Date)   SpO2 100%   BMI 29.03 kg/m  Vital signs in last 24 hours: Temp:  [98.8 F (37.1 C)-101.5 F (38.6 C)] 99 F (37.2 C) (04/23 0600) Pulse Rate:  [91-147] 94 (04/23 0600) Resp:  [14-36] 27 (04/23 0600) BP: (111-222)/(76-157) 114/77 (04/23 0600) SpO2:  [79 %-100 %] 100 % (04/23 0600) Weight:  [72 kg] 72 kg (04/22 1937)   Physical Exam  Constitutional: Appears chronically ill Psych: Affect flat, intermittently cooperative.  She states "I'm sorry" repeatedly even after I tell her she has nothing to be sorry for Eyes: No scleral injection HENT: No oropharyngeal obstruction, poor dentition.  Had some bilious appearing output from her mouth MSK: Bilateral BKA Cardiovascular: Tachycardic but regular rhythm.  Respiratory: Tachypneic GI: Soft.  No distension. There is no tenderness to light palpation.  Skin: Warm dry and intact visible skin, healed sternotomy incision  Neuro: Mental Status: Patient is sleepy but briefly interacts with examiner.  Does not clearly answer any orientation questions.  Does not name thumb for me but tells me she does not know what my pinky finger is.  Able to give very limited history.  No clear neglect although she does not look over to the right side as well and testing is limited given her mental status Cranial Nerves: II: Visual Fields are inconsistent blink to threat throughout.  Left pupil is 4 mm ovoid nonreactive.  Right pupil is ovoid but reactive from 3 mm to 2 mm III,IV, VI: ptosis right worse than left, eyes do not cross much past midline towards the right V: Facial sensation is symmetric to light touch per patient report VII: Facial movement is symmetric at rest but she will not smile.  VIII: hearing is intact to voice Other cranial nerves unable to obtain given her participation Motor: Tone is low throughout. Bulk is atrophic throughout.  She appears to  be slightly weaker in the right upper extremity than the left, with the right drifting almost immediately down to the bed whereas the left is antigravity for 2 to 3 seconds.  Patient did not participate in attempts to test her legs think she was too tired Sensory: Sensation is symmetric to light touch in the arms and legs.  Deep Tendon Reflexes: 3+ and symmetric in the biceps Cerebellar: Unable to perform given mental status  NIHSS total 22 Score breakdown: One-point for level of consciousness, 2 points for answering questions and correctly, one-point for inconsistently following commands, one-point for partial gaze palsy, 2 points for hemianopia, 2 points for left arm weakness, 2 points for right arm weakness, 4 points for left leg weakness, 4 points for right leg weakness, one-point for aphasia (mild to moderate), 2 points for severe dysarthria.      I have reviewed labs in epic and the results pertinent to this consultation are:    Basic Metabolic Panel: Recent Labs  Lab 12/25/20 1055 12/28/20 2011 12/29/20 0024  NA 142 143 142  K 3.7 3.6 3.4*  CL 100 103 102  CO2 32 29 25  GLUCOSE 158* 208* 286*  BUN 18 23* 23*  CREATININE 1.03 1.16* 1.19*  CALCIUM 9.2 9.0 8.7*    CBC: Recent Labs  Lab 12/25/20 1055 12/28/20 2011 12/29/20 0024  WBC 8.7 14.4* 17.6*  NEUTROABS  --  12.0*  --   HGB 10.9* 11.3* 10.5*  HCT 33.8* 35.7* 33.1*  MCV 84.0 87.9 86.9  PLT 613.0* 617* 574*    Coagulation Studies: No results for input(s): LABPROT, INR in the last 72 hours.    Lab Results  Component Value Date   HGBA1C 8.0 (H) 12/25/2020   Lab Results  Component Value Date   CHOL 270 (H) 12/25/2020   HDL 37.70 (L) 12/25/2020   LDLCALC 196 (H) 12/25/2020   LDLDIRECT 133.0 03/02/2017   TRIG 185.0 (H) 12/25/2020   CHOLHDL 7 12/25/2020   Lab Results  Component Value Date   VITAMINB12 290 06/24/2018   Lab Results  Component  Value Date   TSH 2.30 12/25/2020    I have reviewed  the images obtained:  Head CT with new left pontine hypodensity since November 2021  MRI brain with demonstrating that this is an acute stroke, with small bilateral acute strokes largely in the posterior circulation distribution though at least the right cortical stroke appears to be in the anterior circulation distribution  Assessment: Ms. Krawczyk is a chronically ill patient with multiple vascular risk factors as above presenting with fever, aspiration pneumonia, altered mental status, concern for PE, and multifocal new embolic strokes.  The pattern of her strokes is concerning for central embolic source such as aortic arch atherosclerosis, atrial fibrillation or most concerning given her fever, potential endocarditis.  Risks of hemorrhage in the setting of potential endocarditis emboli in the brain is quite high, though of course her pneumonia alone may explain her fevers.  At this time, would avoid anticoagulation unless absolutely needed for a life-threatening indications such as saddle pulmonary embolus, until endocarditis is more clearly ruled out.  Impression: Multifocal strokes, concerning for central embolic source  Fever, concern for endocarditis Possible PE Pneumonia Hypoxia Leukocytosis   Recommendations:  # Multifocal strokes - Stroke labs completed as above - MRI brain completed as above - CTA head and neck when able (awaiting 24 hours since CTA chest due to contrast load) - Frequent neuro checks - Echocardiogram, if negative will need to consider TEE to definitively rule out endocarditis if this remains within patient's goals of care - Continue home aspirin 81 mg, from a neuro perspective may be stopped if anticoagulation is started for PE - Hold Plavix given concern for potential endocarditis to reduce bleeding risk - Risk factor modification, diet, exercise - Telemetry monitoring; 30 day event monitor on discharge if no arrythmias captured  - Blood pressure goal   -  Permissive hypertension to 220/120 due to potential basilar LVO  - Note that she is not a candidate for intervention given her baseline modified Rankin scale - PT consult, OT consult, Speech consult, unless patient is back to baseline - Ongoing goals of care discussion, appreciate palliative care team's involvement - Stroke team to follow, please notify stroke team if patient is transitioned to comfort care and stroke team services are no longer needed  #Cognitive decline -B12 level, thiamine level -Empiric supplementation with B12 1000 mcg IM x7 days given prior low level and concern for recent difficulty eating -Empiric thiamine 100 mg daily IV, may transition to PO on discharge if within goals of care  # Goals of care Did have a 15-minute discussion with the son about his mother's goals of care, clinical course and my concern that she may have underlying vascular dementia, with limited recovery from her new strokes due to the burden of her prior strokes and severity of her other medical issues, specifically that swallowing safely may not be achievable.  He was appropriately and lovingly concerned that transition to comfort care may be what she would wish right now, although he is understandably having difficulty with this decision.  Subsequently Dr. Nevada Crane had further discussion with the son, with transition of CODE STATUS to DNR/DNI.  Please see her note for details  Lesleigh Noe MD-PhD [facility] [phone] Available 7 AM to 7 PM, outside these hours please contact Neurologist on call listed on AMION

## 2020-12-29 NOTE — Progress Notes (Signed)
Called and spoke to the patient's son Mr. [name] at [phone].  He expresses that his mother would not want her code status to be a full code and would prefer to be a DNR.  He is leaning towards comfort care, expressing that his mother would not want to suffer any longer.  He asks for more time to think about his decision.  He will call back and provide a final decision.

## 2020-12-29 NOTE — Progress Notes (Signed)
Called the patient's son Tommi Rumps x 2 to provide updates.  No answer and voicemail not set up on both phone numbers.

## 2020-12-29 NOTE — Progress Notes (Signed)
Acute CVA seen on MRI brain 12/29/20:  1. Moderate-sized acute infarct in the left pons and midbrain. 2. Additional small acute infarcts in the left greater than right cerebral hemispheres. 3. Advanced chronic ischemia with multiple chronic infarcts as Above.  CTA head and neck, 2D echo ordered for stroke work up.  Neuro checks Q4H.  Permissive HTN up to 220/110.  Speech eval/PT/OT.  Neurology following.  Appreciate assistance.  CTA chest to confirm possible R sided lower lobe pulmonary embolism.

## 2020-12-29 NOTE — Significant Event (Addendum)
Rapid Response Event Note   Reason for Call :  Decreased SpO2, inability to control secretions.  Initial Focused Assessment:  Pt lying in bed with eyes closed. Pt will follow simple commands and nod appropriately to some questions. Pt is tachypneic and taking very shallow breaths. Her breathing is congested, cough weak. Lungs diminished t/o. Skin warm and dry. Per RN, when pt does cough, she gags herself and vomits.   T-99.4, HR-124, BP-153/88, RR-36, SpO2-100% on 12L HFNC   Interventions:  None at this time  Plan of Care:  Aspiration precautions, pulmonary toilet. Pt may need to be NTS'd if unable to cough up secretions. Titrate FiO2 for SpO2>92%. Continue to monitor closely. Call RRT if further assistance needed.    Event Summary:   MD Notified:  Call Time:0150 Arrival [phone] End IWOE:[id number]  Dillard Essex, RN

## 2020-12-30 ENCOUNTER — Inpatient Hospital Stay (HOSPITAL_COMMUNITY): Payer: BC Managed Care – PPO

## 2020-12-30 DIAGNOSIS — L8990 Pressure ulcer of unspecified site, unspecified stage: Secondary | ICD-10-CM | POA: Insufficient documentation

## 2020-12-30 DIAGNOSIS — Z515 Encounter for palliative care: Secondary | ICD-10-CM

## 2020-12-30 DIAGNOSIS — R4182 Altered mental status, unspecified: Secondary | ICD-10-CM

## 2020-12-30 DIAGNOSIS — Z7189 Other specified counseling: Secondary | ICD-10-CM

## 2020-12-30 LAB — GLUCOSE, CAPILLARY
Glucose-Capillary: 97 mg/dL (ref 70–99)
Glucose-Capillary: 133 mg/dL — ABNORMAL HIGH (ref 70–99)
Glucose-Capillary: 199 mg/dL — ABNORMAL HIGH (ref 70–99)
Glucose-Capillary: 132 mg/dL — ABNORMAL HIGH (ref 70–99)

## 2020-12-30 LAB — CBC
HCT: 28 % — ABNORMAL LOW (ref 36.0–46.0)
Hemoglobin: 8.8 g/dL — ABNORMAL LOW (ref 12.0–15.0)
MCH: 27.4 pg (ref 26.0–34.0)
MCHC: 31.4 g/dL (ref 30.0–36.0)
MCV: 87.2 fL (ref 80.0–100.0)
Platelets: 476 10*3/uL — ABNORMAL HIGH (ref 150–400)
RBC: 3.21 MIL/uL — ABNORMAL LOW (ref 3.87–5.11)
RDW: 14.6 % (ref 11.5–15.5)
WBC: 15.5 10*3/uL — ABNORMAL HIGH (ref 4.0–10.5)
nRBC: 0 % (ref 0.0–0.2)

## 2020-12-30 LAB — BASIC METABOLIC PANEL
Anion gap: 8 (ref 5–15)
BUN: 21 mg/dL — ABNORMAL HIGH (ref 6–20)
CO2: 31 mmol/L (ref 22–32)
Calcium: 8.7 mg/dL — ABNORMAL LOW (ref 8.9–10.3)
Chloride: 105 mmol/L (ref 98–111)
Creatinine, Ser: 1.05 mg/dL — ABNORMAL HIGH (ref 0.44–1.00)
GFR, Estimated: >60 mL/min (ref >60)
Glucose, Bld: 76 mg/dL (ref 70–99)
Potassium: 3 mmol/L — ABNORMAL LOW (ref 3.5–5.1)
Sodium: 144 mmol/L (ref 135–145)

## 2020-12-30 LAB — MAGNESIUM: Magnesium: 3.5 mg/dL — ABNORMAL HIGH (ref 1.7–2.4)

## 2020-12-30 LAB — PHOSPHORUS: Phosphorus: 2.1 mg/dL — ABNORMAL LOW (ref 2.5–4.6)

## 2020-12-30 LAB — VITAMIN B12: Vitamin B-12: 348 pg/mL (ref 180–914)

## 2020-12-30 MED ORDER — CHLORHEXIDINE GLUCONATE CLOTH 2 % EX PADS
6.0000 | MEDICATED_PAD | Freq: Every day | CUTANEOUS | Status: DC
  Administered 2020-12-30 – 2021-01-03 (×5): 6 via TOPICAL

## 2020-12-30 MED ORDER — RESOURCE THICKENUP CLEAR PO POWD
ORAL | Status: DC | PRN
  Filled 2020-12-30: qty 125

## 2020-12-30 MED ORDER — POTASSIUM CHLORIDE 10 MEQ/100ML IV SOLN
INTRAVENOUS | Status: AC
  Administered 2020-12-30: 10 meq via INTRAVENOUS
  Filled 2020-12-30: qty 100

## 2020-12-30 MED ORDER — POTASSIUM PHOSPHATES 15 MMOLE/5ML IV SOLN
30.0000 mmol | Freq: Once | INTRAVENOUS | Status: AC
  Administered 2020-12-30: 30 mmol via INTRAVENOUS
  Filled 2020-12-30: qty 10

## 2020-12-30 MED ORDER — KCL-LACTATED RINGERS-D5W 20 MEQ/L IV SOLN
INTRAVENOUS | Status: AC
  Filled 2020-12-30 (×2): qty 1000

## 2020-12-30 MED ORDER — POTASSIUM CHLORIDE 10 MEQ/100ML IV SOLN
10.0000 meq | INTRAVENOUS | Status: AC
  Administered 2020-12-30: 10 meq via INTRAVENOUS
  Filled 2020-12-30: qty 100

## 2020-12-30 MED ORDER — IOHEXOL 350 MG/ML SOLN
150.0000 mL | Freq: Once | INTRAVENOUS | Status: AC | PRN
  Administered 2020-12-30: 150 mL via INTRAVENOUS

## 2020-12-30 NOTE — Consult Note (Signed)
Palliative Medicine Inpatient Consult Note  Reason for consult: "To assist with establishing goals of care"   HPI:  Per intake H&P --> [name] is a 55 y.o. female with medical history significant for DMT2 with vascular disease, HTN, Hx of CVA, hx of bilateral BKA, hx of intracranial hemorrhage, CAD, hypothyroidism who lives with her son who is her caregiver. She is bed bound at [facility]. Patients son brought her in for trouble using her right arm to the point whereby he had to help feed her - (+)increasing difficulty with swallowing and coughing and gagging. IN ER found to have a right sided PR and aspiration PNA.  Palliative care was asked to get involved to define goals of care with patients son in the setting of multiple co-morbidities and poor clinical state.   Clinical Assessment/Goals of Care:  *Please note that this is a verbal dictation therefore any spelling or grammatical errors are due to the "[place] [employer]" system interpretation.  I have reviewed medical records including EPIC notes, labs and imaging, received report from bedside RN, assessed the patient who was lying in bed in no distress at the time of assessment.    I called [name] (son) to further discuss diagnosis prognosis, GOC, EOL wishes, disposition and options.   I introduced Palliative Medicine as specialized medical care for people living with serious illness. It focuses on providing relief from the symptoms and stress of a serious illness. The goal is to improve quality of life for both the patient and the family.  [name] shares with me that his mother lives in [place], [place] with him. She is fully dependent on him for all bADLs though prior to this recent incident she was able to feed herself. She is a widow - her husband died two years ago. She has one son, [name] and a grandson. She gets great joy out her grandson. She is not overtly religious.   [name] shares with me that his mothers health had  deteriorated since his father dies two years ago. Since then she has suffered two strokes leaving her impaired. She has endured two BKA's and her quality of life has progressively worsened over time.   A detailed discussion was had today regarding advanced directives - has HCPOA and a living will.    Concepts specific to code status, artifical feeding and hydration, continued IV antibiotics and rehospitalization was had. Patient is a DNAR/DNI code status.  Per [name] she would not wish to have heroic measures if her health situation was deemed as poor by healthcare personnel.   The difference between a aggressive medical intervention path  and a palliative comfort care path for this patient at this time was had. Patients son is struggling with this decision. A careful review of comfort focused care was had. We talked about transition to comfort measures in house and what that would entail inclusive of medications to control pain, dyspnea, agitation, nausea, itching, and hiccups.  We discussed stopping all uneccessary measures such as blood draws, needle sticks, and frequent vital signs.  [name] shares that he would first like his grandparents to see [name] and would want their agreement to this prior to proceeding.   We discussed that it would be possible to have [name] go home with hospice as well which per [name] would be aligned with her wishes.   Discussed the importance of continued conversation with family and their  medical providers regarding overall plan of care and treatment options, ensuring  decisions are within the context of the patients values and GOCs.  Decision Maker: [name] (son) - 51- 28 - 71  SUMMARY OF RECOMMENDATIONS   DNAR/DNI  Patients son, [name] is speaking to his family regarding comfort care and/or transfer home with hospice  Continue present measures of support  Ongoing PMT discussions  Code Status/Advance Care Planning: DNAR/DNI   Palliative Prophylaxis:    Oral care, Skin Ppx turn Q2H  Additional Recommendations (Limitations, Scope, Preferences):  Continue for now to treat what is treatable   Psycho-social/Spiritual:   Desire for further Chaplaincy support: No  Additional Recommendations: Education on progressing of chronic disease processes   Prognosis: Prognosis is poor in the setting of multiple co-morbidities increase frailty with poor functional and nutritional state at [facility],   Discharge Planning:   Vitals:   12/29/20 2336 12/30/20 0328  BP: (!) 155/86 (!) 147/79  Pulse: 82 83  Resp: 19 20  Temp: 98.6 F (37 C) 98.5 F (36.9 C)  SpO2: 100% 100%   No intake or output data in the 24 hours ending 12/30/20 7342 Last Weight  Most recent update: 12/28/2020  7:38 PM   Weight  72 kg (158 lb 11.7 oz)           Gen:  Ill appearing Caucasian F HEENT: moist mucous membranes CV: Irregular rate and rhythm  PULM: 4LPM HFNC  ABD: soft/nontender  EXT: BLE BKA's Neuro: Alert and oriented x1  PPS: 10%   This conversation/these recommendations were discussed with patient primary care team, Dr. Nevada Crane  Time In: 8768 Time Out: 1530 Total Time: 70 Greater than 50%  of this time was spent counseling and coordinating care related to the above assessment and plan.  [facility] Team Team Cell Phone: [phone] Please utilize secure chat with additional questions, if there is no response within 30 minutes please call the above phone number  Palliative Medicine Team providers are available by phone from 7am to 7pm daily and can be reached through the team cell phone.  Should this patient require assistance outside of these hours, please call the patient's attending physician.

## 2020-12-30 NOTE — Evaluation (Signed)
Clinical/Bedside Swallow Evaluation Patient Details  Name: [name] MRN: [id number] Date of Birth: 03/29/1966  Today's Date: 12/30/2020 Time: SLP Start Time (ACUTE ONLY): 1045 SLP Stop Time (ACUTE ONLY): 1105 SLP Time Calculation (min) (ACUTE ONLY): 20 min  Past Medical History:  Past Medical History:  Diagnosis Date  . Abscess of great toe, right   . Acute on chronic diastolic CHF (congestive heart failure) ([place]) 06/24/2018  . Acute osteomyelitis of toe, left ([place]) 09/05/2016  . Amputation of right great toe ([place]) 12/22/2018  . Anxiety   . Cataract    left - surgery to remove  . Cellulitis of foot, right 12/11/2018  . COMMON MIGRAINE 10/07/2010  . Coronary artery disease   . Decreased visual acuity 11/10/2016  . Depression 12/18/2012  . Diabetes mellitus type II, uncontrolled ([place]) 10/07/2010   Qualifier: Diagnosis of  By: Charlett Blake MD, [name]    . Diabetic foot infection ([place]) 08/26/2016  . Diabetic infection of right foot ([place])   . Disturbance of skin sensation 10/07/2010  . Gangrene of right foot ([place])   . GERD (gastroesophageal reflux disease)   . Heart murmur   . History of kidney stones    "years ago" - passed stones  . Hyperlipidemia 12/06/2010  . Hypertension   . Hypothyroidism   . Lipoma of abdominal wall 10/05/2016  . Overweight(278.02) 12/06/2010  . PERIPHERAL NEUROPATHY, FEET 10/07/2010   11/17/2019  . PVD (peripheral vascular disease) ([place]) 01/21/2012  . RESTLESS LEG SYNDROME 10/25/2010  . Stroke [facility]) 2014, 2017   most recently in 2/17 - intracerebral hemorrhage   Past Surgical History:  Past Surgical History:  Procedure Laterality Date  . AMPUTATION Right 12/13/2018   Procedure: AMPUTATION RIGHT GREAT TOE, LOCAL RELOCATION OF TISSUE FOR WOUND CLOSURE 9cm x 3cm, VAC APPLICATION;  Surgeon: [name], MD;  Location: [facility];  Service: Orthopedics;  Laterality: Right;  . AMPUTATION Right 12/31/2018   Procedure: RIGHT BELOW KNEE AMPUTATION;  Surgeon: [name],  MD;  Location: [facility];  Service: Orthopedics;  Laterality: Right;  . AMPUTATION Left 09/21/2019   Procedure: LEFT BELOW KNEE AMPUTATION;  Surgeon: [name], MD;  Location: [facility];  Service: Orthopedics;  Laterality: Left;  . AMPUTATION TOE Left 09/09/2016   Procedure: AMPUTATION OF LEFT GREAT TOE;  Surgeon: Milly Jakob, MD;  Location: [facility];  Service: Orthopedics;  Laterality: Left;  . BELOW KNEE LEG AMPUTATION Left 09/21/2019  . CARDIAC CATHETERIZATION  06/24/2018  . CORONARY ARTERY BYPASS GRAFT N/A 06/28/2018   Procedure: CORONARY ARTERY BYPASS GRAFTING (CABG) times  four, using left internal mammary artery, endoscopically harvested right saphenous vein, and harvested left radial artery;  Surgeon: [name], MD;  Location: [facility];  Service: Open Heart Surgery;  Laterality: N/A;  . ENDOVEIN HARVEST OF GREATER SAPHENOUS VEIN Right 06/28/2018   Procedure: ENDOVEIN HARVEST OF GREATER SAPHENOUS VEIN;  Surgeon: [name], MD;  Location: [facility];  Service: Open Heart Surgery;  Laterality: Right;  . EYE SURGERY Left 06/2017   [facility]  . LEFT HEART CATH AND CORONARY ANGIOGRAPHY N/A 06/24/2018   Procedure: LEFT HEART CATH AND CORONARY ANGIOGRAPHY;  Surgeon: Troy Sine, MD;  Location: [facility];  Service: Cardiovascular;  Laterality: N/A;  . RADIAL ARTERY HARVEST Left 06/28/2018   Procedure: RADIAL ARTERY HARVEST;  Surgeon: [name], MD;  Location: [facility];  Service: Open Heart Surgery;  Laterality: Left;  . STUMP REVISION Left 11/18/2019  Procedure: REVISION LEFT BELOW KNEE AMPUTATION;  Surgeon: [name], MD;  Location: [facility];  Service: Orthopedics;  Laterality: Left;  . TEE WITHOUT CARDIOVERSION N/A 06/28/2018   Procedure: TRANSESOPHAGEAL ECHOCARDIOGRAM (TEE);  Surgeon: [name], MD;  Location: [facility];  Service: Open Heart Surgery;  Laterality: N/A;  . WISDOM TOOTH EXTRACTION     HPI:  Patient is a 55 y.o. female  with PMH: DM-2 with vascular disease, HTN, h/o CVA (2021), bilateral BKA, ICH, CAD, hypothyroidism who lives with son who is her caregiver. (patient is bedbound) She was brought to ED secondary to son observing increasing SOB, vomit of nonbloody emesis followed by patient starting to gurgle which prompted him to call EMS. When EMS arrived they suctioned significant amount of thick yellowish phlegm from back of her throat; oxygen saturations were 85%. She was febrile at 101/6 in ED and with elevated WBC count. CXR negative for active disease, MRI revealed Moderate-sized acute infarct in the left pons and midbrain, additional small acute infarcts in left greater than right cerebral hemispheres.   Assessment / Plan / Recommendation Clinical Impression  Patient presents with a mild-moderate oropharyngeal dysphagia with delayed coughing with thin liquids. When consuming all PO's, patient exhibited delayed oral motor movements, appears with generalized oral motor weakness. Swallow initiation delay suspected with liquids. Patient did not exhibit any overt s/s aspiration or penetration with cup sips of nectar thick liquids or puree solids. She took one small bite of puree (pudding) but seemed to prefer the liquids. SLP is recommending to initiate Dys 1, nectar thick liquids diet and will determine need for objective swallow study next visit. SLP Visit Diagnosis: Dysphagia, unspecified (R13.10)    Aspiration Risk  Mild aspiration risk;Risk for inadequate nutrition/hydration    Diet Recommendation Dysphagia 1 (Puree);Nectar-thick liquid   Liquid Administration via: Cup Medication Administration: Crushed with puree Supervision: Full supervision/cueing for compensatory strategies;Staff to assist with self feeding Compensations: Minimize environmental distractions;Slow rate;Small sips/bites Postural Changes: Seated upright at 90 degrees    Other  Recommendations Oral Care Recommendations: Oral care BID    Follow up Recommendations 24 hour supervision/assistance;Skilled Nursing facility      Frequency and Duration min 2x/week  1 week       Prognosis Prognosis for Safe Diet Advancement: Good      Swallow Study   General Date of Onset: 12/28/20 HPI: Patient is a 55 y.o. female with PMH: DM-2 with vascular disease, HTN, h/o CVA (2021), bilateral BKA, ICH, CAD, hypothyroidism who lives with son who is her caregiver. (patient is bedbound) She was brought to ED secondary to son observing increasing SOB, vomit of nonbloody emesis followed by patient starting to gurgle which prompted him to call EMS. When EMS arrived they suctioned significant amount of thick yellowish phlegm from back of her throat; oxygen saturations were 85%. She was febrile at 101/6 in ED and with elevated WBC count. CXR negative for active disease, MRI revealed Moderate-sized acute infarct in the left pons and midbrain, additional small acute infarcts in left greater than right cerebral hemispheres. Type of Study: Bedside Swallow Evaluation Previous Swallow Assessment: BSE during previous hospitalization in 2021 Diet Prior to this Study: NPO Temperature Spikes Noted: No Respiratory Status: Nasal cannula History of Recent Intubation: No Behavior/Cognition: Alert;Cooperative;Pleasant mood Oral Cavity Assessment: Dry Oral Care Completed by SLP: Yes Oral Cavity - Dentition: Edentulous Self-Feeding Abilities: Total assist Patient Positioning: Upright in bed Baseline Vocal Quality: Hoarse;Low vocal intensity Volitional Cough: Weak Volitional Swallow: Unable  to elicit    Oral/Motor/Sensory Function Overall Oral Motor/Sensory Function: Moderate impairment Facial ROM: Reduced right;Reduced left Facial Symmetry: Within Functional Limits Facial Strength: Reduced right;Reduced left Lingual ROM: Within Functional Limits Lingual Symmetry: Within Functional Limits Lingual Strength: Reduced   Ice Chips     Thin Liquid Thin  Liquid: Impaired Presentation: Cup Oral Phase Functional Implications: Prolonged oral transit Pharyngeal  Phase Impairments: Suspected delayed Swallow;Cough - Delayed    Nectar Thick Nectar Thick Liquid: Within functional limits Presentation: Cup   Honey Thick     Puree Puree: Impaired Oral Phase Functional Implications: Prolonged oral transit Pharyngeal Phase Impairments: Suspected delayed Swallow   Solid     Solid: Not tested      Sonia Baller, MA, CCC-SLP Speech Therapy

## 2020-12-30 NOTE — Procedures (Signed)
Patient Name: [name]  MRN: [id number]  Epilepsy Attending: Lora Havens  Referring Physician/Provider: Dr Phillips Odor Date: 12/30/2020 Duration: 21.51 mins  Patient history: 55 yo F with ams. EEG to evaluate for seizure  Level of alertness: Awake  AEDs during EEG study: None  Technical aspects: This EEG study was done with scalp electrodes positioned according to the 10-20 International system of electrode placement. Electrical activity was acquired at a sampling rate of 500Hz  and reviewed with a high frequency filter of 70Hz  and a low frequency filter of 1Hz . EEG data were recorded continuously and digitally stored.   Description: No clear posterior dominant rhythm was seen. EEG showed continuous generalized polymorphic sharply contoured 3 to 6 Hz theta-delta slowing. Hyperventilation and photic stimulation were not performed.     ABNORMALITY - Continuous slow, generalized  IMPRESSION: This study is suggestive of moderate diffuse encephalopathy, nonspecific etiology but likely related to underlying stokes. No seizures or epileptiform discharges were seen throughout the recording.   Eduardo Wurth Barbra Sarks

## 2020-12-30 NOTE — Progress Notes (Addendum)
STROKE TEAM PROGRESS NOTE   INTERVAL HISTORY Patient is awake and alert lying in bed in NAD. RN at bedside.  No family at bedside. Patient is oriented to self only. She is able to follow simple commands. No voiced complaints. RN informs me that there is a palliative care meeting scheduled for today with the family.   Vitals:   12/30/20 0328 12/30/20 0731 12/30/20 1100 12/30/20 1200  BP: (!) 147/79 (!) 182/92 (!) 180/98 (!) 163/91  Pulse: 83 93 97 93  Resp: 20 18 20 17   Temp: 98.5 F (36.9 C) 98 F (36.7 C) 97.7 F (36.5 C)   TempSrc: Axillary Axillary Oral   SpO2: 100% 100% 100% 100%  Weight:      Height:       CBC:  Recent Labs  Lab 12/28/20 2011 12/29/20 0024 12/30/20 0036  WBC 14.4* 17.6* 15.5*  NEUTROABS 12.0*  --   --   HGB 11.3* 10.5* 8.8*  HCT 35.7* 33.1* 28.0*  MCV 87.9 86.9 87.2  PLT 617* 574* 0000000*   Basic Metabolic Panel:  Recent Labs  Lab 12/29/20 0024 12/30/20 0036  NA 142 144  K 3.4* 3.0*  CL 102 105  CO2 25 31  GLUCOSE 286* 76  BUN 23* 21*  CREATININE 1.19* 1.05*  CALCIUM 8.7* 8.7*  MG 1.4* 3.5*  PHOS  --  2.1*   Lipid Panel:  Recent Labs  Lab 12/25/20 1055  CHOL 270*  TRIG 185.0*  HDL 37.70*  CHOLHDL 7  VLDL 37.0  LDLCALC 196*   HgbA1c:  Recent Labs  Lab 12/25/20 1055  HGBA1C 8.0*   Urine Drug Screen: No results for input(s): LABOPIA, COCAINSCRNUR, LABBENZ, AMPHETMU, THCU, LABBARB in the last 168 hours.  Alcohol Level No results for input(s): ETH in the last 168 hours.  IMAGING past 24 hours CT ANGIO HEAD NECK W WO CM  Result Date: 12/30/2020 CLINICAL DATA:  Evaluate dysphagia EXAM: CT ANGIOGRAPHY HEAD AND NECK TECHNIQUE: Multidetector CT imaging of the head and neck was performed using the standard protocol during bolus administration of intravenous contrast. Multiplanar CT image reconstructions and MIPs were obtained to evaluate the vascular anatomy. Carotid stenosis measurements (when applicable) are obtained utilizing NASCET  criteria, using the distal internal carotid diameter as the denominator. CONTRAST:  17mL OMNIPAQUE IOHEXOL 350 MG/ML SOLN COMPARISON:  Head CT from 2 days ago FINDINGS: CT HEAD FINDINGS Brain: Known acute infarct in the left pons. Chronic small vessel ischemia with chronic lacunar infarcts in the deep gray nuclei and deep white matter. Chronic moderate size right occipital parietal cortex infarct. No hemorrhage or visible progression. No hydrocephalus Vascular: See below Skull: Normal. Negative for fracture or focal lesion. Sinuses: Imaged portions are clear. Orbits: Left cataract resection Review of the MIP images confirms the above findings CTA NECK FINDINGS Aortic arch: Atheromatous plaque.  Partially covered CABG changes. Right carotid system: Low-density atheromatous wall thickening of the common carotid with mixed density plaque at the bifurcation causing 40% ICA stenosis. No ulceration or dissection. Left carotid system: Low-density atheromatous wall thickening of the common carotid with accentuated plaque at the bifurcation. Left ICA origin stenosis measures 35% on sagittal reformats. No dissection or ulceration. Vertebral arteries: Proximal subclavian atherosclerosis without flow limiting stenosis. Atheromatous plaque at the right vertebral origin and V1 segment without flow reducing stenosis based on coronal reformats. No vertebral dissection or beading. Skeleton: No acute finding Other neck: No evidence of mass or inflammation. Upper chest: Airspace disease in the right  upper lobe. Review of the MIP images confirms the above findings CTA HEAD FINDINGS Anterior circulation: Heavily calcified carotid siphons. No stenosis of 50% or greater seen when allowing for artifact from calcified plaque blooming. Advanced right MCA bifurcation stenosis with some degree of underfilling of downstream branches. Better patency of left MCA branches, although there is still atheromatous changes Posterior circulation:  Atheromatous plaque on the right more than left V4 segment with high-grade narrowing on the right that is progressed. Diffusely patent basilar. Fetal type left PCA. Mild left posterior communicating artery stenosis. Advanced right P1 and P2 segment narrowings, the former likely progressed from 2021 Venous sinuses: Negative Anatomic variants: As above Review of the MIP images confirms the above findings IMPRESSION: 1. No emergent vascular finding. 2. Advanced intracranial atherosclerosis with high-grade right V4, right P1, right P2, and right MCA narrowings. Atherosclerotic disease has progressed from 2021. 3. Cervical carotid atherosclerosis with proximal ICA stenosis measuring 40% on the right and 35% on the left. 4. Right upper lobe pneumonia. Electronically Signed   By: Monte Fantasia M.D.   On: 12/30/2020 09:37   CT ANGIO CHEST PE W OR WO CONTRAST  Result Date: 12/30/2020 CLINICAL DATA:  Rule out PE. EXAM: CT ANGIOGRAPHY CHEST WITH CONTRAST TECHNIQUE: Multidetector CT imaging of the chest was performed using the standard protocol during bolus administration of intravenous contrast. Multiplanar CT image reconstructions and MIPs were obtained to evaluate the vascular anatomy. CONTRAST:  159mL OMNIPAQUE IOHEXOL 350 MG/ML SOLN COMPARISON:  CT chest 12/29/2020 FINDINGS: Cardiovascular: The heart size appears within normal limits. Previous median sternotomy and CABG. Coronary artery calcifications. There are 2 pulmonary artery filling defects within the distal right lower lobar pulmonary artery as well as a segmental branch of the right lower lobe pulmonary artery compatible with pulmonary emboli. Mediastinum/Nodes: Prominent mediastinal lymph nodes are likely reactive. No pathologically enlarged mediastinal, hilar, or axillary lymph nodes. Thyroid gland, trachea, and esophagus demonstrate no significant findings. Lungs/Pleura: No pleural effusion. Dense airspace consolidation within the posterior right upper  lobe is again noted and appears unchanged. Patchy airspace densities within both lower lung zones are also noted and appears similar to the previous study. No signs of pneumothorax. Upper Abdomen: No acute abnormality. Gallstones identified. Aortic atherosclerosis. Musculoskeletal: No chest wall abnormality. No acute or significant osseous findings. Review of the MIP images confirms the above findings. IMPRESSION: 1. Examination is positive for acute pulmonary emboli within the distal right lower lobar pulmonary artery and a segmental branch of the right lower lobe pulmonary artery. 2. Similar appearance of dense airspace consolidation within the posterior right upper lobe and patchy airspace densities within both lower lung zones. 3. Gallstones. 4. Aortic atherosclerosis. Aortic Atherosclerosis (ICD10-I70.0). Electronically Signed   By: Kerby Moors M.D.   On: 12/30/2020 09:18     EEG Description: No clear posterior dominant rhythm was seen. EEG showed continuous generalized polymorphic sharply contoured 3 to 6 Hz theta-delta slowing. Hyperventilation and photic stimulation were not performed.     ABNORMALITY - Continuous slow, generalized  IMPRESSION: This study is suggestive of moderate diffuse encephalopathy, nonspecific etiology but likely related to underlying stokes. No seizures or epileptiform discharges were seen throughout the recording.      PHYSICAL EXAM  Physical Exam  Constitutional: frail women.  Psych: Flat Affect Eyes: No scleral injection HENT: No OP obstrucion MSK: no joint deformities.  Cardiovascular: Normal rate and regular rhythm.  Respiratory: Effort normal, non-labored breathing GI: Soft.  No distension. There is no tenderness.  Skin: WDI  Neuro: Mental Status: Patient is awake, alert, oriented to person. Patient is not able to give a clear and coherent history. No signs of aphasia or neglect Cranial Nerves: II: Visual Fields are full. Pupils are not  equal, left is 4 and nonreactive, right 3 reactive, round, III,IV, VI: EOM not intact. Eyes don't cross midline to right, without ptosis or diploplia.  V: Facial sensation is symmetric to temperature VII: Facial movement is symmetric.  VIII: hearing is intact to voice X: Uvula elevates symmetrically XI: Shoulder shrug is symmetric. XII: tongue is midline without atrophy or fasciculations.  Motor: Tone is normal. Bulk is normal. 4/5 strength was present in all four extremities. Has bilateral BKA Sensory: Sensation is symmetric to light touch and temperature in the arms and legs. Cerebellar: FNF unable to assess  ASSESSMENT/PLAN Ms. ENRIQUETA AUGUSTA is a 55 y.o. female with history of significant vascular disease (bilateral BKA, coronary artery disease, multiple prior strokes), diabetes, hypertension, hyperlipidemia, obesity, hypothyroidism.  She is bedbound at baseline. She presented to the ED on 4/22 due to being slow to awaken in the morning, and then having cough and increased shortness of breath, severe aspiration event, emesis (nonbloody, yellow fluid), requiring suctioning on EMS arrival with an O2 saturation of 85%.  She was febrile to 101.6 in the ED, with a leukocytosis to 14 and age indeterminant infarct of the left pons (new since November 2021) seen on head CT.  She was initially normotensive but then became quite hypertensive, with significant lability of her blood pressure  Acute Pons/Midbrain Stroke  left  infarct embolic secondary to small vessel disease source  CT head  A lacunar infarct within the left pons and pontomesencephalic junction is new from the prior head CT of 07/28/2020, but otherwise age indeterminate. Consider a brain MRI for further evaluation. Stable background generalized parenchymal atrophy and chronic ischemic disease with multiple chronic infarcts, as described.  CTA head  Known acute infarct in the left pons. Chronic small vessel ischemia with  chronic lacunar infarcts in the deep gray nuclei and deep white matter. Chronic moderate size right occipital parietal cortex infarct. No hemorrhage or visible progression. No Hydrocephalus   CTA neck No emergent vascular finding. 2. Advanced intracranial atherosclerosis with high-grade right V4, right P1, right P2, and right MCA narrowings. Atherosclerotic disease has progressed from 2021. 3. Cervical carotid atherosclerosis with proximal ICA stenosis measuring 40% on the right and 35% on the left. 4. Right upper lobe pneumonia.  MRI   1. Moderate-sized acute infarct in the left pons and midbrain. 2. Additional small acute infarcts in the left greater than right cerebral hemispheres. 3. Advanced chronic ischemia with multiple chronic infarcts as above.  2D Echo ordered  LDL 196  HgbA1c 8.0  VTE prophylaxis - SCD    Diet   DIET - DYS 1 Room service appropriate? Yes; Fluid consistency: Nectar Thick     aspirin 81 mg daily prior to admission, now on aspirin 81 mg daily.   Therapy recommendations:  PT/OT  Disposition:  Pending therapy recs and family decisions for GOC  Hypertension  Home meds:  norvasc 10mg  daily, cozaar 50mg  daily   Stable . Permissive hypertension (OK if < 220/120) but gradually normalize in 5-7 days . Long-term BP goal normotensive  Hyperlipidemia  Home meds:  lipitor 80mg  , resumed in hospital  LDL 196, goal <70  Continue statin at discharge  Diabetes type II UnControlled  Home meds:  metformin  HgbA1c 8.0,  goal < 7.0  CBGs Recent Labs    12/29/20 2004 12/30/20 0333 12/30/20 0937  GLUCAP 144* 97 133*      SSI  Other Stroke Risk Factors  Cigarette smoker*advised to stop smoking  ETOH use, alcohol level <10, advised to drink no more than 1 drink(s) a day  Hx stroke/TIA  Coronary artery disease  Other Active Problems  Aspiration Pna- per primary team   Type 2 DM -SSI  Hospital day # 2 Tiffany Gandy, DNP,  [facility]     Dr. Merlene Laughter -- The patient was seen and examined by me; notes, chart and tests reviewed and discussed with the practice provider, other providers, patient, and family.  She is more awake awake and alert today and follow commands although she has severe dysarthria with significant hypophonia mild flattening of the nasolabial fold on the left.  The case is discussed extensively with the hospitalist.  The patient presented with significant encephalopathy and seem to have improved spontaneously suggestive of possible seizure.  EEG shows moderate global slowing however but she does have a past history of cortical infarct and therefore potentially could have had a seizure.  She has multiple embolic appearing acute  bilateral infarcts on MRI which is reviewed in person.  She is being worked up for possible endocarditis but may need to have a TEE and possible loop recorder.  Also chest CT shows acute pulmonary embolism right lower lobe.   To contact Stroke Continuity provider, please refer to [email or web address] After hours, contact General Neurology

## 2020-12-30 NOTE — Progress Notes (Signed)
   Palliative Medicine Inpatient Follow Up Note  Spoke to patient's son, [name] over the phone.  He shares with me that that at this point in time he is not ready to pursue comfort oriented care as he wants his grandmother to be at peace with the decision and right now she is not remains hopeful for [name] to make improvements.    [name] shares with me that he would like to bring his son in tomorrow around noon prior to additional decisions being made.  I had gotten approval for his 52-year-old son to come in for 30-minute period of time from the 4 E. Therapist, sports, Kelvin Cellar.   We reviewed the importance of considering [name]'s quality of life and how she has struggled over the last 2 years with a variety of different health ailments in the setting of her CVAs.  [name] understands this and shares with me that he feels strongly she would want to be made comfortable in this circumstance and be in her home.  We agreed to have ongoing discussions tomorrow afternoon.  SUMMARY OF RECOMMENDATIONS DNAR/DNI  Continue present measures of support  Patients son, [name] is speaking to his family regarding comfort care and/or transfer home with hospice.  He shares that he is hopeful to have a decision tomorrow afternoon.  Ongoing PMT discussions - I will not be present tomorrow though I will request one of my colleagues on the palliative care team aid in following up in supporting [name] during this very difficult time.  Discussed with Dr. Nevada Crane  Time In: 1732 Time Out: 1800 Time Spent: 28 additional minutes Greater than 50% of the time was spent in counseling and coordination of care ______________________________________________________________________________________ [place] Team Team Cell Phone: [phone] Please utilize secure chat with additional questions, if there is no response within 30 minutes please call the above phone number  Palliative  Medicine Team providers are available by phone from 7am to 7pm daily and can be reached through the team cell phone.  Should this patient require assistance outside of these hours, please call the patient's attending physician.

## 2020-12-30 NOTE — Progress Notes (Signed)
PROGRESS NOTE  [name] [id number] DOB: Oct 19, 1965 DOA: 12/28/2020 PCP: Mosie Lukes, MD  HPI/Recap of past 24 hours: [name] is a 55 y.o. female with medical history significant for DMT2 with vascular disease, HTN, Hx of ischemic CVA, hx of bilateral BKA, hx of intracranial hemorrhage in 2017, CAD, hypothyroidism who lives with her son who is also her caregiver. She is bedbound.  Reportedly patient was slow to awaken and get out of bed this morning.  Her son took her on the porch to sit in the sun which is not uncommon for him to do with her when she started to cough and had increasing shortness of breath.  She reportedly vomited some nonbloody emesis.  Her son had gone in to get a drink when he came back out and found her gurgling and called EMS.  When they arrived they did suction her and got a significant amount of thick yellowish phlegm from the back of her throat.  She was noted to have oxygen saturation around 85% at that time.  She does not use oxygen at home.  The son reportedly told the ER physician that patient has had increasing difficulty with swallowing and coughing and gagging when she eats over the last few months but has not had this evaluated.  Reportedly at home she does not have any fevers but in the emergency room she had temperature to 101.6 degrees.  No recent travel.  In the emergency room patient is noted to have temperature to 101.6 degrees and elevated white blood cell count greater than 14,000.  CT the head was obtained which showed a lacunar infarct of unknown age.  ER physician discussed with neurology who recommended MRI of the brain and they would see the patient after MRI is obtained.  Patient has had witnessed coughing and gagging with aspiration in the emergency room, was started on antibiotic empirically for aspiration pneumonia.  CT of the chest with contrast done on 12/29/2020 consistent with multifocal pneumonia, possible small right lower lobe  although false-positive from artifact is definitely possible.  Per radiologist report consider venous work-up or follow-up chest CT. bilateral lower extremity Doppler ultrasound negative for DVT.  CT angio chest positive for right sided pulmonary embolism.  MRI brain revealed moderate sized acute infarct in the left pons and midbrain, additional small acute infarcts in the left greater than right cerebral hemispheres.  Advanced chronic ischemia with multiple chronic infarcts as above.  Due to concern for possible endocarditis with possibility for hemorrhagic conversion, neurology Dr. Curly Shores recommended to hold off on anticoagulation for now.  On 12/29/2020, her son made decision to change her CODE STATUS from full code to DNR, stating that is what she would have wanted.  12/29/20: Seen and examined at her bedside.  She is more alert, oriented x2.  Due to concern for possible seizure and EEG was obtained, no seizure or epileptiform discharges at the time of this EEG.  Assessment/Plan: Principal Problem:   Aspiration pneumonia (HCC) Active Problems:   Essential hypertension, benign   Anemia of chronic disease   Type 2 diabetes mellitus with vascular disease (HCC)   S/P bilateral BKA (below knee amputation) ([place])   Lacunar infarction ([place])   Thrombocytosis   Respiratory distress   Pressure injury of skin   Improving acute metabolic encephalopathy, suspect multifactorial secondary to acute CVA, acute illness, aspiration pneumonia, acute hypoxemia. Onset at home prior to presentation. Concern for aspiration and aspiration pneumonia. Hypoxemic on presentation  O2 saturation in the mid 80s on room air. Not on oxygen supplementation at baseline. Reorient as needed Avoid any sedating agents. She is more alert at the time of this visit.  Answering to questions although her speech is slurred.  Acute CVA involving left pons and midbrain, additional small acute infarcts in the left greater than right  cerebral hemispheres.  Seen on MRI brain done on 12/29/2020. Advanced chronic ischemia with multiple chronic infarcts as above.  Due to concern for possible endocarditis with possibility for hemorrhagic conversion, neurology Dr. Curly Shores recommended to hold off on anticoagulation for now.  Sepsis secondary to aspiration pneumonia, POA Presented with leukocytosis, tachycardia and tachypnea.  With evidence of aspiration pneumonia on CT chest. Witnessed coughing and gagging in the ER with concern for aspiration. CT chest done on 12/29/2020 with evidence of aspiration pneumonia, personally reviewed. She was started on Unasyn empirically in the ED, continue.  Pulmonary Speech therapist evaluation once stable Aspiration precautions Follow cultures  Dysphagia Seen by speech therapist with recommendation for dysphagia 1 diet. Continue aspiration precautions.  Newly diagnosed right-sided PE Bilateral lower extremity Doppler ultrasound negative for DVT. CTA chest confirmed right-sided pulmonary embolism. Follow 2D echo Neurology consulted anticoagulation recommendations due to the patient's history of intracranial hemorrhage.  Acute hypoxic respiratory failure secondary to aspiration pneumonia Not on oxygen supplementation at baseline Currently on 4 L O2 saturation in the mid 90s Continue to maintain O2 saturation greater than 92%.  History of intracranial hemorrhage in 2017 Since then she has had recurrent ischemic CVA Neurology consulted and recommended to repeat an MRI brain on 12/29/2020, pending. Continue neurochecks frequently  Refractory hypokalemia Repleted intravenously. Repeat level in the morning.  Type 2 diabetes with hyperglycemia Hemoglobin A1c 8.0 on 12/25/2020. Continue insulin sliding scale.  CKD 2 Appears to be at her baseline creatinine with GFR > 60. Creatinine 1.05 with GFR of greater than 60. Avoid nephrotoxic agents Continue to continue to monitor urine  output Repeat renal panel in the morning.  Bilateral below the knee amputation Continue fall precautions Frequent turning  Goals of care  Patient's son [name] change her CODE STATUS to DNR 12/29/2020. Palliative care team consulted to assist with establishing goals of care.  Code Status: Full code  Family Communication: None at bedside.  Disposition Plan: Likely will discharge to home with home health services on 12/31/2020 or when neurology signs off.   Consultants:  Neurology  Palliative care team.  Procedures:  None.  Antimicrobials:  Unasyn.  DVT prophylaxis: Subcu Lovenox daily.  Status is: Inpatient   Dispo: The patient is from: Home.               Anticipated d/c is to: Likely home with home health services on 12/31/2020 or when neurology signs off.              Patient currently, not stable for discharge due to ongoing management of aspiration pneumonia, acute hypoxic respiratory failure, acute CVA.   Difficult to place patient, not applicable.        Objective: Vitals:   12/30/20 0328 12/30/20 0731 12/30/20 1100 12/30/20 1200  BP: (!) 147/79 (!) 182/92 (!) 180/98 (!) 163/91  Pulse: 83 93 97 93  Resp: 20 18 20 17   Temp: 98.5 F (36.9 C) 98 F (36.7 C) 97.7 F (36.5 C)   TempSrc: Axillary Axillary Oral   SpO2: 100% 100% 100% 100%  Weight:      Height:  Intake/Output Summary (Last 24 hours) at 12/30/2020 1334 Last data filed at 12/30/2020 0741 Gross per 24 hour  Intake --  Output 1400 ml  Net -1400 ml   Filed Weights   12/28/20 1937  Weight: 72 kg    Exam:  . General: 55 y.o. year-old female chronically ill-appearing no acute distress.  She is alert oriented x2.   . Cardiovascular: Regular rate and rhythm no rubs or gallops.   Marland Kitchen Respiratory: Mild diffuse rales bilaterally with poor inspiratory effort.   . Abdomen: Soft nontender normal bowel sounds present. . Musculoskeletal: Bilateral lower extremity amputation. . Skin: No  ulcerative lesions noted. Marland Kitchen Psychiatry: Mood is appropriate for condition and setting.   Data Reviewed: CBC: Recent Labs  Lab 12/25/20 1055 12/28/20 2011 12/29/20 0024 12/30/20 0036  WBC 8.7 14.4* 17.6* 15.5*  NEUTROABS  --  12.0*  --   --   HGB 10.9* 11.3* 10.5* 8.8*  HCT 33.8* 35.7* 33.1* 28.0*  MCV 84.0 87.9 86.9 87.2  PLT 613.0* 617* 574* 476*   Basic Metabolic Panel: Recent Labs  Lab 12/25/20 1055 12/28/20 2011 12/29/20 0024 12/30/20 0036  NA 142 143 142 144  K 3.7 3.6 3.4* 3.0*  CL 100 103 102 105  CO2 32 29 25 31   GLUCOSE 158* 208* 286* 76  BUN 18 23* 23* 21*  CREATININE 1.03 1.16* 1.19* 1.05*  CALCIUM 9.2 9.0 8.7* 8.7*  MG  --   --  1.4* 3.5*  PHOS  --   --   --  2.1*   GFR: Estimated Creatinine Clearance: 55 mL/min (A) (by C-G formula based on SCr of 1.05 mg/dL (H)). Liver Function Tests: Recent Labs  Lab 12/25/20 1055 12/28/20 2011  AST 11 21  ALT 8 16  ALKPHOS 93 81  BILITOT 0.5 1.0  PROT 6.9 7.2  ALBUMIN 3.1* 2.7*   Recent Labs  Lab 12/28/20 2011  LIPASE 31   No results for input(s): AMMONIA in the last 168 hours. Coagulation Profile: No results for input(s): INR, PROTIME in the last 168 hours. Cardiac Enzymes: No results for input(s): CKTOTAL, CKMB, CKMBINDEX, TROPONINI in the last 168 hours. BNP (last 3 results) No results for input(s): PROBNP in the last 8760 hours. HbA1C: No results for input(s): HGBA1C in the last 72 hours. CBG: Recent Labs  Lab 12/29/20 1325 12/29/20 1814 12/29/20 2004 12/30/20 0333 12/30/20 0937  GLUCAP 150* 164* 144* 97 133*   Lipid Profile: No results for input(s): CHOL, HDL, LDLCALC, TRIG, CHOLHDL, LDLDIRECT in the last 72 hours. Thyroid Function Tests: No results for input(s): TSH, T4TOTAL, FREET4, T3FREE, THYROIDAB in the last 72 hours. Anemia Panel: Recent Labs    12/30/20 0036  VITAMINB12 348   Urine analysis:    Component Value Date/Time   COLORURINE YELLOW 07/28/2020 0300    APPEARANCEUR HAZY (A) 07/28/2020 0300   LABSPEC 1.020 07/28/2020 0300   PHURINE 7.0 07/28/2020 0300   GLUCOSEU >=500 (A) 07/28/2020 0300   GLUCOSEU NEGATIVE 05/21/2015 0831   HGBUR NEGATIVE 07/28/2020 0300   BILIRUBINUR NEGATIVE 07/28/2020 0300   KETONESUR NEGATIVE 07/28/2020 0300   PROTEINUR >=300 (A) 07/28/2020 0300   UROBILINOGEN 0.2 05/21/2015 0831   NITRITE NEGATIVE 07/28/2020 0300   LEUKOCYTESUR NEGATIVE 07/28/2020 0300   Sepsis Labs: @LABRCNTIP (procalcitonin:4,lacticidven:4)  ) Recent Results (from the past 240 hour(s))  Resp Panel by RT-PCR (Flu A&B, Covid) Nasopharyngeal Swab     Status: None   Collection Time: 12/28/20  9:09 PM   Specimen: Nasopharyngeal Swab;  Nasopharyngeal(NP) swabs in vial transport medium  Result Value Ref Range Status   SARS Coronavirus 2 by RT PCR NEGATIVE NEGATIVE Final    Comment: (NOTE) SARS-CoV-2 target nucleic acids are NOT DETECTED.  The SARS-CoV-2 RNA is generally detectable in upper respiratory specimens during the acute phase of infection. The lowest concentration of SARS-CoV-2 viral copies this assay can detect is 138 copies/mL. A negative result does not preclude SARS-Cov-2 infection and should not be used as the sole basis for treatment or other patient management decisions. A negative result may occur with  improper specimen collection/handling, submission of specimen other than nasopharyngeal swab, presence of viral mutation(s) within the areas targeted by this assay, and inadequate number of viral copies(<138 copies/mL). A negative result must be combined with clinical observations, patient history, and epidemiological information. The expected result is Negative.  Fact Sheet for Patients:  EntrepreneurPulse.com.au  Fact Sheet for Healthcare Providers:  IncredibleEmployment.be  This test is no t yet approved or cleared by the [place] FDA and  has been authorized for detection and/or  diagnosis of SARS-CoV-2 by FDA under an Emergency Use Authorization (EUA). This EUA will remain  in effect (meaning this test can be used) for the duration of the COVID-19 declaration under Section 564(b)(1) of the Act, 21 U.S.C.section 360bbb-3(b)(1), unless the authorization is terminated  or revoked sooner.       Influenza A by PCR NEGATIVE NEGATIVE Final   Influenza B by PCR NEGATIVE NEGATIVE Final    Comment: (NOTE) The Xpert Xpress SARS-CoV-2/FLU/RSV plus assay is intended as an aid in the diagnosis of influenza from Nasopharyngeal swab specimens and should not be used as a sole basis for treatment. Nasal washings and aspirates are unacceptable for Xpert Xpress SARS-CoV-2/FLU/RSV testing.  Fact Sheet for Patients: EntrepreneurPulse.com.au  Fact Sheet for Healthcare Providers: IncredibleEmployment.be  This test is not yet approved or cleared by the [place] FDA and has been authorized for detection and/or diagnosis of SARS-CoV-2 by FDA under an Emergency Use Authorization (EUA). This EUA will remain in effect (meaning this test can be used) for the duration of the COVID-19 declaration under Section 564(b)(1) of the Act, 21 U.S.C. section 360bbb-3(b)(1), unless the authorization is terminated or revoked.  Performed at [facility], [place] [address] [place], [place] [address]   Blood culture (routine x 2)     Status: None (Preliminary result)   Collection Time: 12/28/20  9:09 PM   Specimen: BLOOD  Result Value Ref Range Status   Specimen Description BLOOD RIGHT ANTECUBITAL  Final   Special Requests   Final    BOTTLES DRAWN AEROBIC AND ANAEROBIC Blood Culture results may not be optimal due to an excessive volume of blood received in culture bottles   Culture   Final    NO GROWTH 2 DAYS Performed at [facility], [place] [address] [place], [place] [address]    Report Status PENDING  Incomplete  Blood culture (routine x  2)     Status: None (Preliminary result)   Collection Time: 12/28/20  9:09 PM   Specimen: BLOOD RIGHT FOREARM  Result Value Ref Range Status   Specimen Description BLOOD RIGHT FOREARM  Final   Special Requests   Final    BOTTLES DRAWN AEROBIC AND ANAEROBIC Blood Culture results may not be optimal due to an excessive volume of blood received in culture bottles   Culture   Final    NO GROWTH 2 DAYS Performed at [facility]  Lab, [address] [place], [place] [address]    Report Status PENDING  Incomplete      Studies: CT ANGIO HEAD NECK W WO CM  Result Date: 12/30/2020 CLINICAL DATA:  Evaluate dysphagia EXAM: CT ANGIOGRAPHY HEAD AND NECK TECHNIQUE: Multidetector CT imaging of the head and neck was performed using the standard protocol during bolus administration of intravenous contrast. Multiplanar CT image reconstructions and MIPs were obtained to evaluate the vascular anatomy. Carotid stenosis measurements (when applicable) are obtained utilizing NASCET criteria, using the distal internal carotid diameter as the denominator. CONTRAST:  116mL OMNIPAQUE IOHEXOL 350 MG/ML SOLN COMPARISON:  Head CT from 2 days ago FINDINGS: CT HEAD FINDINGS Brain: Known acute infarct in the left pons. Chronic small vessel ischemia with chronic lacunar infarcts in the deep gray nuclei and deep white matter. Chronic moderate size right occipital parietal cortex infarct. No hemorrhage or visible progression. No hydrocephalus Vascular: See below Skull: Normal. Negative for fracture or focal lesion. Sinuses: Imaged portions are clear. Orbits: Left cataract resection Review of the MIP images confirms the above findings CTA NECK FINDINGS Aortic arch: Atheromatous plaque.  Partially covered CABG changes. Right carotid system: Low-density atheromatous wall thickening of the common carotid with mixed density plaque at the bifurcation causing 40% ICA stenosis. No ulceration or dissection. Left carotid system: Low-density  atheromatous wall thickening of the common carotid with accentuated plaque at the bifurcation. Left ICA origin stenosis measures 35% on sagittal reformats. No dissection or ulceration. Vertebral arteries: Proximal subclavian atherosclerosis without flow limiting stenosis. Atheromatous plaque at the right vertebral origin and V1 segment without flow reducing stenosis based on coronal reformats. No vertebral dissection or beading. Skeleton: No acute finding Other neck: No evidence of mass or inflammation. Upper chest: Airspace disease in the right upper lobe. Review of the MIP images confirms the above findings CTA HEAD FINDINGS Anterior circulation: Heavily calcified carotid siphons. No stenosis of 50% or greater seen when allowing for artifact from calcified plaque blooming. Advanced right MCA bifurcation stenosis with some degree of underfilling of downstream branches. Better patency of left MCA branches, although there is still atheromatous changes Posterior circulation: Atheromatous plaque on the right more than left V4 segment with high-grade narrowing on the right that is progressed. Diffusely patent basilar. Fetal type left PCA. Mild left posterior communicating artery stenosis. Advanced right P1 and P2 segment narrowings, the former likely progressed from 2021 Venous sinuses: Negative Anatomic variants: As above Review of the MIP images confirms the above findings IMPRESSION: 1. No emergent vascular finding. 2. Advanced intracranial atherosclerosis with high-grade right V4, right P1, right P2, and right MCA narrowings. Atherosclerotic disease has progressed from 2021. 3. Cervical carotid atherosclerosis with proximal ICA stenosis measuring 40% on the right and 35% on the left. 4. Right upper lobe pneumonia. Electronically Signed   By: Monte Fantasia M.D.   On: 12/30/2020 09:37   CT ANGIO CHEST PE W OR WO CONTRAST  Result Date: 12/30/2020 CLINICAL DATA:  Rule out PE. EXAM: CT ANGIOGRAPHY CHEST WITH  CONTRAST TECHNIQUE: Multidetector CT imaging of the chest was performed using the standard protocol during bolus administration of intravenous contrast. Multiplanar CT image reconstructions and MIPs were obtained to evaluate the vascular anatomy. CONTRAST:  121mL OMNIPAQUE IOHEXOL 350 MG/ML SOLN COMPARISON:  CT chest 12/29/2020 FINDINGS: Cardiovascular: The heart size appears within normal limits. Previous median sternotomy and CABG. Coronary artery calcifications. There are 2 pulmonary artery filling defects within the distal right lower lobar pulmonary artery as well  as a segmental branch of the right lower lobe pulmonary artery compatible with pulmonary emboli. Mediastinum/Nodes: Prominent mediastinal lymph nodes are likely reactive. No pathologically enlarged mediastinal, hilar, or axillary lymph nodes. Thyroid gland, trachea, and esophagus demonstrate no significant findings. Lungs/Pleura: No pleural effusion. Dense airspace consolidation within the posterior right upper lobe is again noted and appears unchanged. Patchy airspace densities within both lower lung zones are also noted and appears similar to the previous study. No signs of pneumothorax. Upper Abdomen: No acute abnormality. Gallstones identified. Aortic atherosclerosis. Musculoskeletal: No chest wall abnormality. No acute or significant osseous findings. Review of the MIP images confirms the above findings. IMPRESSION: 1. Examination is positive for acute pulmonary emboli within the distal right lower lobar pulmonary artery and a segmental branch of the right lower lobe pulmonary artery. 2. Similar appearance of dense airspace consolidation within the posterior right upper lobe and patchy airspace densities within both lower lung zones. 3. Gallstones. 4. Aortic atherosclerosis. Aortic Atherosclerosis (ICD10-I70.0). Electronically Signed   By: Kerby Moors M.D.   On: 12/30/2020 09:18   EEG adult  Result Date: 12/30/2020 Lora Havens, MD      12/30/2020  1:17 PM Patient Name: [name] MRN: [id number] Epilepsy Attending: Lora Havens Referring Physician/Provider: Dr Phillips Odor Date: 12/30/2020 Duration: 21.51 mins Patient history: 55 yo F with ams. EEG to evaluate for seizure Level of alertness: Awake AEDs during EEG study: None Technical aspects: This EEG study was done with scalp electrodes positioned according to the 10-20 International system of electrode placement. Electrical activity was acquired at a sampling rate of 500Hz  and reviewed with a high frequency filter of 70Hz  and a low frequency filter of 1Hz . EEG data were recorded continuously and digitally stored. Description: No clear posterior dominant rhythm was seen. EEG showed continuous generalized polymorphic sharply contoured 3 to 6 Hz theta-delta slowing. Hyperventilation and photic stimulation were not performed.   ABNORMALITY - Continuous slow, generalized IMPRESSION: This study is suggestive of moderate diffuse encephalopathy, nonspecific etiology but likely related to underlying stokes. No seizures or epileptiform discharges were seen throughout the recording. Priyanka Barbra Sarks    Scheduled Meds: . acetaminophen  650 mg Oral Once  . Chlorhexidine Gluconate Cloth  6 each Topical Daily  . cyanocobalamin  1,000 mcg Intramuscular Daily  . insulin aspart  0-15 Units Subcutaneous Q6H  . thiamine injection  100 mg Intravenous Daily    Continuous Infusions: . ampicillin-sulbactam (UNASYN) IV 3 g (12/30/20 1100)  . dextrose 5% lactated ringers with KCl 20 mEq/L 50 mL/hr at 12/30/20 0929  . lactated ringers 75 mL/hr at 12/30/20 0233  . potassium PHOSPHATE IVPB (in mmol) 30 mmol (12/30/20 0932)     LOS: 2 days     Kayleen Memos, MD [facility] Pager [phone]  If 7PM-7AM, please contact night-coverage [email or web address] Password [facility] 12/30/2020, 1:34 PM

## 2020-12-30 NOTE — Progress Notes (Signed)
Routine EEG complete. Pending results

## 2020-12-30 NOTE — Progress Notes (Signed)
PT Cancellation Note  Patient Details Name: [name] MRN: [id number] DOB: May 14, 1966   Cancelled Treatment:    Reason Eval/Treat Not Completed: Patient declined, no reason specified. Family requesting PT to return later when son is present. Per family present unsure of goals of care at this time. PT will follow up as time allows.   Zenaida Niece 12/30/2020, 5:57 PM

## 2020-12-30 NOTE — Progress Notes (Signed)
Occupational Therapy Evaluation Patient Details Name: FIONA COTO MRN: [id number] DOB: 02-Apr-1966 Today's Date: 12/30/2020    History of Present Illness Per Hospitalist progress note- "SHIRLY BARTOSIEWICZ is a 55 y.o. female with medical history significant for DMT2 with vascular disease, HTN, Hx of ischemic CVA, hx of bilateral BKA, hx of intracranial hemorrhage in 2017, CAD, hypothyroidism who lives with her son who is also her caregiver. She is bedbound.  Reportedly patient was slow to awaken and get out of bed this morning.  Her son took her on the porch to sit in the sun which is not uncommon for him to do with her when she started to cough and had increasing shortness of breath.  She reportedly vomited some nonbloody emesis.  Her son had gone in to get a drink when he came back out and found her gurgling and called EMS.  When they arrived they did suction her and got a significant amount of thick yellowish phlegm from the back of her throat.  She was noted to have oxygen saturation around 85% at that time.  She does not use oxygen at home.  The son reportedly told the ER physician that patient has had increasing difficulty with swallowing and coughing and gagging when she eats over the last few months but has not had this evaluated.  Reportedly at home she does not have any fevers but in the emergency room she had temperature to 101.6 degrees.  No recent travel.     In the emergency room patient is noted to have temperature to 101.6 degrees and elevated white blood cell count greater than 14,000.  CT the head was obtained which showed a lacunar infarct of unknown age.  ER physician discussed with neurology who recommended MRI of the brain and they would see the patient after MRI is obtained.  Patient has had witnessed coughing and gagging with aspiration in the emergency room, was started on antibiotic empirically for aspiration pneumonia.  CT of the chest with contrast done on 12/29/2020 consistent  with multifocal pneumonia, possible small right lower lobe although false-positive from artifact is definitely possible.  Per radiologist report consider venous work-up or follow-up chest CT. bilateral lower extremity Doppler ultrasound negative for DVT.  CT angio chest positive for right sided pulmonary embolism.  MRI brain revealed moderate sized acute infarct in the left pons and midbrain, additional small acute infarcts in the left greater than right cerebral hemispheres.  Advanced chronic ischemia with multiple chronic infarcts as above.  Due to concern for possible endocarditis with possibility for hemorrhagic conversion, neurology Dr. Curly Sh[place] recommended to hold off on anticoagulation for now."   Clinical Impression   Pt is a poor historian, no family present to confirm pt's PLOF/home setup/level of S/A available at d/c. Pt reports living with her son in a 1-level house with no STE and ramp access. Pt reports needing total assist with ADLs/ADL mobility/transfers from her son at home. Per hospital documentation, son is the pt's primary caregiver and he may transition her to comfort care.   Today, pt received semi-reclined in bed, pt agreeable to OT eval. Pt presents with 4L of O2 donned, increased BP at rest 176/93 (117) (nursing notified), confusion, R hemibody weakness, decreased activity tolerance. Pt attempted to wash face with cloth at bed level using L hand, however pt had difficulty maintaining good grasp and required total assist. Overall, pt requires total assist with UB/LB self-care, functional mobility, transfers. Pt has WFL (3/5) strength/ROM to LUE  and attempts to utilize for functional activities. Educated pt on positioning, d/c plans, potential therapy goals, cognitive re-orientation, and PLB. Will continue to follow pt in hospital at low frequency to provide pt/caregiver education and practice feeding/eating/grooming as able.     Follow Up Recommendations  Other (comment) (Either  continue 24/7 S/A at home from son if he is agreeable/capable or recommend SNF with 24/7 S/A)    Equipment Recommendations  Other (comment) (need to confirm with son)    Recommendations for Other Services PT consult     Precautions / Restrictions Precautions Precautions: Other (comment);Fall (bilateral BKA) Precaution Comments: requiring 4L of O2 via [place] Restrictions Weight Bearing Restrictions: No      Mobility Bed Mobility Overal bed mobility: Needs Assistance    General bed mobility comments: anticipate 2+ assist for bed mobility    Transfers   General transfer comment: bed level eval only    Balance Overall balance assessment:  (not assessed, bed level eval only)      ADL either performed or assessed with clinical judgement   ADL Overall ADL's : Needs assistance/impaired Eating/Feeding: Total assistance;Bed level (with modified diet)   Grooming: Total assistance;Bed level (attempted to wash face at bed level, required total assist to complete)   Upper Body Bathing: Total assistance;Bed level   Lower Body Bathing: Total assistance;Bed level   Upper Body Dressing : Total assistance;Bed level   Lower Body Dressing: Total assistance;Bed level   Toilet Transfer:  (not assessed, bed level brief changes, foley)   Toileting- Clothing Manipulation and Hygiene: Total assistance;Bed level   Tub/ Shower Transfer:  (not assessed) Tub/Shower Transfer Details (indicate cue type and reason): dependent for sponge bathing at bed level Functional mobility during ADLs: Total assistance;+2 for physical assistance;+2 for safety/equipment General ADL Comments: bed level eval only, cannot tolerate EOB, in documentation son reports total assist for ADLs/ADL mobility/transfers at home     Vision Baseline Vision/History: Wears glasses Wears Glasses: At all times (not present however) Vision Assessment?: No apparent visual deficits     Perception Perception Perception Tested?:  No   Praxis Praxis Praxis tested?: Not tested    Pertinent Vitals/Pain Pain Assessment: No/denies pain     Extremity/Trunk Assessment Upper Extremity Assessment Upper Extremity Assessment: Generalized weakness;RUE deficits/detail RUE Deficits / Details: 2+ to 3-/5 throughout RUE Sensation: WNL RUE Coordination: decreased fine motor;decreased gross motor   Lower Extremity Assessment Lower Extremity Assessment: Generalized weakness   Cervical / Trunk Assessment Cervical / Trunk Assessment: Normal   Communication Communication Communication: Other (comment) (difficult to understand, talks softly)   Cognition   Behavior During Therapy: Flat affect Overall Cognitive Status: History of cognitive impairments - at baseline      General Comments: previous CVA deficits, requiring total assist overall at home from son per documentation   General Comments  bilateral BKA, R side hemibody weakness, difficult to understand (speaks quietly and slowly), 4L of O2 currently, multiple lines, son reports total assist for ADLs/ADL mobility/transfers at home typically      Home Living Family/patient expects to be discharged to:: Private residence Living Arrangements: Children Available Help at Discharge: Family;Available 24 hours/day Type of Home: House Home Access: Level entry (with ramp access)     Home Layout: One level     Bathroom Shower/Tub: Other (comment) (sponge bathing at bed level only)   Bathroom Toilet:  (brief changes at bed level) Bathroom Accessibility:  (does not access bathroom at home)   Home Equipment: Hospital bed;Wheelchair - manual (  need to confirm with son)   Additional Comments: need to confirm setup with son      Prior Functioning/Environment Level of Independence: Needs assistance  Gait / Transfers Assistance Needed: dependent ADL's / Homemaking Assistance Needed: dependent Communication / Swallowing Assistance Needed: modified diet          OT  Problem List: Decreased strength;Decreased range of motion;Decreased activity tolerance;Impaired balance (sitting and/or standing);Decreased cognition;Decreased knowledge of use of DME or AE;Impaired tone;Impaired UE functional use      OT Treatment/Interventions: Self-care/ADL training;Neuromuscular education;DME and/or AE instruction;Cognitive remediation/compensation;Patient/family education    OT Goals(Current goals can be found in the care plan section) Acute Rehab OT Goals Patient Stated Goal: none stated OT Goal Formulation: With patient Time For Goal Achievement: 01/13/21 Potential to Achieve Goals: Poor  OT Frequency: Min 1X/week    AM-PAC OT "6 Clicks" Daily Activity     Outcome Measure Help from another person eating meals?: Total Help from another person taking care of personal grooming?: Total Help from another person toileting, which includes using toliet, bedpan, or urinal?: Total Help from another person bathing (including washing, rinsing, drying)?: Total Help from another person to put on and taking off regular upper body clothing?: Total Help from another person to put on and taking off regular lower body clothing?: Total 6 Click Score: 6   End of Session Equipment Utilized During Treatment: Oxygen Nurse Communication: Mobility status;Other (comment) (increased BP reading)  Activity Tolerance: Patient limited by fatigue;Treatment limited secondary to medical complications (Comment) Patient left: in bed;with call bell/phone within reach;with bed alarm set  OT Visit Diagnosis: Muscle weakness (generalized) (M62.81);Feeding difficulties (R63.3);Other symptoms and signs involving cognitive function;Hemiplegia and hemiparesis Hemiplegia - Right/Left: Right Hemiplegia - dominant/non-dominant: Dominant Hemiplegia - caused by:  (previous CVA)                Time: 1202-1224 OT Time Calculation (min): 22 min Charges:  OT General Charges $OT Visit: 1 Visit OT  Evaluation $OT Eval Moderate Complexity: 1 Mod  Michel Bickers, OTR/L [facility] [phone]   Francesca Jewett 12/30/2020, 2:20 PM

## 2020-12-31 ENCOUNTER — Telehealth: Payer: Self-pay | Admitting: Family Medicine

## 2020-12-31 DIAGNOSIS — J189 Pneumonia, unspecified organism: Secondary | ICD-10-CM

## 2020-12-31 DIAGNOSIS — Z515 Encounter for palliative care: Secondary | ICD-10-CM

## 2020-12-31 LAB — CBC
HCT: 25.6 % — ABNORMAL LOW (ref 36.0–46.0)
Hemoglobin: 7.9 g/dL — ABNORMAL LOW (ref 12.0–15.0)
MCH: 27.1 pg (ref 26.0–34.0)
MCHC: 30.9 g/dL (ref 30.0–36.0)
MCV: 88 fL (ref 80.0–100.0)
Platelets: 455 10*3/uL — ABNORMAL HIGH (ref 150–400)
RBC: 2.91 MIL/uL — ABNORMAL LOW (ref 3.87–5.11)
RDW: 14.9 % (ref 11.5–15.5)
WBC: 13.8 10*3/uL — ABNORMAL HIGH (ref 4.0–10.5)
nRBC: 0 % (ref 0.0–0.2)

## 2020-12-31 LAB — BASIC METABOLIC PANEL
Anion gap: 9 (ref 5–15)
BUN: 11 mg/dL (ref 6–20)
CO2: 29 mmol/L (ref 22–32)
Calcium: 7.9 mg/dL — ABNORMAL LOW (ref 8.9–10.3)
Chloride: 106 mmol/L (ref 98–111)
Creatinine, Ser: 1.16 mg/dL — ABNORMAL HIGH (ref 0.44–1.00)
GFR, Estimated: 55 mL/min — ABNORMAL LOW (ref >60)
Glucose, Bld: 132 mg/dL — ABNORMAL HIGH (ref 70–99)
Potassium: 3.8 mmol/L (ref 3.5–5.1)
Sodium: 144 mmol/L (ref 135–145)

## 2020-12-31 LAB — GLUCOSE, CAPILLARY
Glucose-Capillary: 149 mg/dL — ABNORMAL HIGH (ref 70–99)
Glucose-Capillary: 125 mg/dL — ABNORMAL HIGH (ref 70–99)
Glucose-Capillary: 135 mg/dL — ABNORMAL HIGH (ref 70–99)
Glucose-Capillary: 148 mg/dL — ABNORMAL HIGH (ref 70–99)

## 2020-12-31 LAB — PROCALCITONIN: Procalcitonin: 6.29 ng/mL

## 2020-12-31 LAB — PHOSPHORUS: Phosphorus: 3.3 mg/dL (ref 2.5–4.6)

## 2020-12-31 MED ORDER — AMLODIPINE BESYLATE 10 MG PO TABS
10.0000 mg | ORAL_TABLET | Freq: Every day | ORAL | Status: DC
  Administered 2020-12-31: 10 mg via ORAL
  Filled 2020-12-31: qty 1

## 2020-12-31 MED ORDER — ATORVASTATIN CALCIUM 80 MG PO TABS
80.0000 mg | ORAL_TABLET | Freq: Every day | ORAL | Status: DC
  Administered 2020-12-31: 80 mg via ORAL
  Filled 2020-12-31: qty 1

## 2020-12-31 MED ORDER — THIAMINE HCL 100 MG PO TABS: 100.0000 mg | ORAL_TABLET | Freq: Every day | ORAL | Status: DC

## 2020-12-31 MED ORDER — APIXABAN 5 MG PO TABS: 5.0000 mg | ORAL_TABLET | Freq: Two times a day (BID) | ORAL | Status: DC

## 2020-12-31 MED ORDER — APIXABAN 5 MG PO TABS
10.0000 mg | ORAL_TABLET | Freq: Two times a day (BID) | ORAL | Status: DC
  Administered 2020-12-31: 10 mg via ORAL
  Filled 2020-12-31: qty 2

## 2020-12-31 MED ORDER — METOPROLOL TARTRATE 25 MG PO TABS
25.0000 mg | ORAL_TABLET | Freq: Every evening | ORAL | Status: DC
  Administered 2020-12-31: 25 mg via ORAL
  Filled 2020-12-31: qty 1

## 2020-12-31 MED ORDER — FERROUS SULFATE 325 (65 FE) MG PO TABS
325.0000 mg | ORAL_TABLET | Freq: Every day | ORAL | Status: DC
  Administered 2020-12-31: 325 mg via ORAL
  Filled 2020-12-31: qty 1

## 2020-12-31 MED ORDER — ESCITALOPRAM OXALATE 10 MG PO TABS
10.0000 mg | ORAL_TABLET | Freq: Every evening | ORAL | Status: DC
  Administered 2020-12-31: 10 mg via ORAL
  Filled 2020-12-31 (×2): qty 1

## 2020-12-31 MED ORDER — GLYCOPYRROLATE 0.2 MG/ML IJ SOLN
0.2000 mg | INTRAMUSCULAR | Status: DC
  Administered 2020-12-31 – 2021-01-04 (×20): 0.2 mg via INTRAVENOUS
  Filled 2020-12-31 (×21): qty 1

## 2020-12-31 NOTE — Progress Notes (Signed)
ANTICOAGULATION CONSULT NOTE - Initial Consult  Pharmacy Consult for APixaban  Indication: pulmonary embolus  Allergies  Allergen Reactions  . Lyrica [Pregabalin] Other (See Comments)    MADE PATIENT VERY EMOTIONAL AND WOULD CRY EASILY  . Morphine And Related Nausea And Vomiting  . Penicillins Other (See Comments)    GI upset related to Augmentin use  Did it involve swelling of the face/tongue/throat, SOB, or low BP? No Did it involve sudden or severe rash/hives, skin peeling, or any reaction on the inside of your mouth or nose? No Did you need to seek medical attention at a hospital or doctor's office? No When did it last happen?April 2020 If all above answers are "NO", may proceed with cephalosporin use.   . Tramadol Nausea And Vomiting    Pt cant tolerate this pain med.     Patient Measurements: Height: 5\' 2"  (157.5 cm) Weight: 72 kg (158 lb 11.7 oz) IBW/kg (Calculated) : 50.1  Vital Signs: Temp: 99 F (37.2 C) (04/25 1603) Temp Source: Oral (04/25 1603) BP: 153/82 (04/25 1603) Pulse Rate: 88 (04/25 1603)  Labs: Recent Labs    12/29/20 0024 12/30/20 0036 12/31/20 0609  HGB 10.5* 8.8* 7.9*  HCT 33.1* 28.0* 25.6*  PLT 574* 476* 455*  CREATININE 1.19* 1.05* 1.16*    Estimated Creatinine Clearance: 49.8 mL/min (A) (by C-G formula based on SCr of 1.16 mg/dL (H)).   Medical History: Past Medical History:  Diagnosis Date  . Abscess of great toe, right   . Acute on chronic diastolic CHF (congestive heart failure) ([place]) 06/24/2018  . Acute osteomyelitis of toe, left ([place]) 09/05/2016  . Amputation of right great toe ([place]) 12/22/2018  . Anxiety   . Cataract    left - surgery to remove  . Cellulitis of foot, right 12/11/2018  . COMMON MIGRAINE 10/07/2010  . Coronary artery disease   . Decreased visual acuity 11/10/2016  . Depression 12/18/2012  . Diabetes mellitus type II, uncontrolled ([place]) 10/07/2010   Qualifier: Diagnosis of  By: Charlett Blake MD, [name]    .  Diabetic foot infection ([place]) 08/26/2016  . Diabetic infection of right foot ([place])   . Disturbance of skin sensation 10/07/2010  . Gangrene of right foot ([place])   . GERD (gastroesophageal reflux disease)   . Heart murmur   . History of kidney stones    "years ago" - passed stones  . Hyperlipidemia 12/06/2010  . Hypertension   . Hypothyroidism   . Lipoma of abdominal wall 10/05/2016  . Overweight(278.02) 12/06/2010  . PERIPHERAL NEUROPATHY, FEET 10/07/2010   11/17/2019  . PVD (peripheral vascular disease) ([place]) 01/21/2012  . RESTLESS LEG SYNDROME 10/25/2010  . Stroke [facility]) 2014, 2017   most recently in 2/17 - intracerebral hemorrhage      Assessment: 55yof admitted with cough and started on antibiotics.  She had MS changes and found to have CVA, s/p chest CT found to have PE  Discussion with MD for risk of hemorrhagic - and higher dose apixaban for PE dosing  OK to treat PE  Goal of Therapy:   Monitor platelets by anticoagulation protocol: Yes   Plan:  Apixaban 10mg  BID x7 days  Then apixaban 5mg  BID there after  Monitor s/s bleeding    Bonnita Nasuti Pharm.D. CPP, BCPS Clinical Pharmacist [phone] 12/31/2020 4:06 PM

## 2020-12-31 NOTE — Progress Notes (Signed)
PT Cancellation Note  Patient Details Name: CIJI BOSTON MRN: [id number] DOB: April 07, 1966   Cancelled Treatment:    Reason Eval/Treat Not Completed: Patient not medically ready (Pt with acute PE awaiting IVC filter or anticoagulation initiation. Pt per chart is total assist and bedbound at home but pt unable to currently provide accurate history and unable to reach son via home or mobile phone after attempting 3x today)   Doreene Forrey B Rhian Funari 12/31/2020, 12:16 PM  Bayard Males, [place] Pager: [phone] Office: [phone]

## 2020-12-31 NOTE — Telephone Encounter (Signed)
We cannot tell her anything as she is not HCP but the patient did have a new event so her change in status is appropriate. Tell her sorry but I did look and agree.

## 2020-12-31 NOTE — Progress Notes (Addendum)
Palliative Medicine Inpatient Follow Up Note  HPI:  Per intake H&P --> Tiffany Guadron Davenportis a 55 y.o.femalewith medical history significant forDMT2 with vascular disease, HTN, Hx of CVA, hx of bilateral BKA, hx of intracranial hemorrhage, CAD, hypothyroidism who lives with her son who is her caregiver. She is bed bound at [facility]. Patients son brought her in for trouble using her right arm to the point whereby he had to help feed her - (+)increasing difficulty with swallowing and coughing and gagging. IN ER found to have a right sided PR and aspiration PNA.  Palliative care was asked to get involved to define goals of care with patients son in the setting of multiple co-morbidities and poor clinical state.   Clinical Assessment/Goals of Care: Medical records reviewed. Discussed with RN [name] and assessed patient at the bedside. She is awake and oriented x2. Breathing comfortably on 4L [place]. Denies pain or distress. She states "don't worry about me" although her speech is quite slurred. Her son [name] and grandson [name] are present at the bedside. She also mentions God and her son states she would certainly be interested in a chaplain visit.   Followed up on goals of care conversation and created space for [name] thoughts after arranging for patient's elderly parents to visit at the bedside yesterday. He reports this was helpful and they have a better understanding of the severity of her illness. The family agrees that planning for home with hospice is appropriate at this time, with a desire to honor the patient's wishes to go to her parents' home if feasible. We discussed [name] experience with his father passing away from cancer and the affect this has had on the patient's declining health and their decisions moving forward.   Educated on hospice philosophy and the resources available in the home. [name] understands that the patient's care will largely be provided by family and the live-in CNA  currently assisting. He understands that life-prolonging measures are no longer covered with hospice benefits. Resources for children and grief counseling were reviewed. The focus is on comfort, peace, and dignity. Educated on comfort care options available in the hospital. [name] agrees with a hospice approach once the patient returns home, but would prefer to continue with current interventions to stabilize the patient as he makes these arrangement.  Discussed treatment options currently being offered. The patient shakes her head no and [name] makes it clear that she does not want any surgical interventions or invasive procedures. He confirms that he does not want additional medical interventions or escalation of care at this time. Her priority is spending quality time with her family, realizing that her quantity of time may be limited. [name] shares that they have many DME already in the home and he would likely be ready for discharge in 1-2 days.   Questions and concerns addressed. [employer] for [employer] booklet provided for review. PMT will continue to support holistically.  SUMMARY OF RECOMMENDATIONS Appreciate TOC assistance with referral to hospice - family desires home hospice, needs a wheelchair  Continue present measures of support, no escalation, no IV heparin, no TEE or IVC filter   Appreciate chaplain consult for spiritual support    Ongoing PMT discussions and psychosocial support   Addendum: Ordered 0.2mg  Robinul Q4H for excessive secretions  Discussed with patient, patient's son, Dr. Nevada Crane, Marvetta Gibbons RN/CM, Thurmond Butts LCSW  Time In: 0240 Time Out: 1450 Time Spent: 35 minutes  Greater than 50% of the above time was spent  in counseling and coordination of care ______________________________________________________________________________________ Dorthy Cooler, [facility] Team Team Cell Phone: [phone] Please utilize secure  chat with additional questions, if there is no response within 30 minutes please call the above phone number  Palliative Medicine Team providers are available by phone from 7am to 7pm daily and can be reached through the team cell phone.  Should this patient require assistance outside of these hours, please call the patient's attending physician.

## 2020-12-31 NOTE — Progress Notes (Addendum)
PROGRESS NOTE  [name] QPY:[id number] DOB: [birth year] DOA: 12/28/2020 PCP: Bradd Canary, MD  HPI/Recap of past 24 hours: [name] is a 55 y.o. female with medical history significant for DMT2 with vascular disease, HTN, Hx of ischemic CVA, hx of bilateral BKA, hx of intracranial hemorrhage in 2017, CAD, hypothyroidism who lives with her son who is also her caregiver. She is bedbound.  Reportedly patient was slow to awaken and get out of bed this morning.  Her son took her on the porch to sit in the sun which is not uncommon for him to do with her when she started to cough and had increasing shortness of breath.  She reportedly vomited some nonbloody emesis.  Her son had gone in to get a drink when he came back out and found her gurgling and called EMS.  When they arrived they did suction her and got a significant amount of thick yellowish phlegm from the back of her throat.  She was noted to have oxygen saturation around 85% at that time.  She does not use oxygen at home.  The son reportedly told the ER physician that patient has had increasing difficulty with swallowing and coughing and gagging when she eats over the last few months but has not had this evaluated.  Reportedly at home she does not have any fevers but in the emergency room she had temperature to 101.6 degrees.  No recent travel.  In the emergency room patient is noted to have temperature to 101.6 degrees and elevated white blood cell count greater than 14,000.  CT the head was obtained which showed a lacunar infarct of unknown age.  ER physician discussed with neurology who recommended MRI of the brain and they would see the patient after MRI is obtained.  Patient has had witnessed coughing and gagging with aspiration in the emergency room, was started on antibiotic empirically for aspiration pneumonia.  CT of the chest with contrast done on 12/29/2020 consistent with multifocal pneumonia, possible small right lower lobe  although false-positive from artifact is definitely possible.  Per radiologist report consider venous work-up or follow-up chest CT. bilateral lower extremity Doppler ultrasound negative for DVT.  CT angio chest positive for right sided pulmonary embolism.  MRI brain revealed moderate sized acute infarct in the left pons and midbrain, additional small acute infarcts in the left greater than right cerebral hemispheres.  Advanced chronic ischemia with multiple chronic infarcts as above.  Due to concern for possible endocarditis with possibility for hemorrhagic conversion, neurology Dr. Iver Nestle recommended to hold off on anticoagulation for now.  On 12/29/2020, her son made decision to change her CODE STATUS from full code to DNR, stating that is what she would have wanted.  Due to concern for possible seizure and EEG was obtained, no seizure or epileptiform discharges at the time of this EEG.  12/31/20: Seen and examined at her bedside.  She is alert, pleasant and attempts to answer questions when asked.  Goals of care discussion with family and palliative care.  Leaning towards hospice care at discharge.  Family has declined any invasive procedures.  TEE and IVC filter placement canceled.    Assessment/Plan: Principal Problem:   Aspiration pneumonia (HCC) Active Problems:   Essential hypertension, benign   Anemia of chronic disease   Type 2 diabetes mellitus with vascular disease (HCC)   S/P bilateral BKA (below knee amputation) (HCC)   Lacunar infarction (HCC)   Thrombocytosis   Respiratory distress  Pressure injury of skin   Improving acute metabolic encephalopathy, suspect multifactorial secondary to acute CVA, acute illness, aspiration pneumonia, acute hypoxemia. Onset at home prior to presentation. Concern for aspiration and aspiration pneumonia. Hypoxemic on presentation O2 saturation in the mid 80s on room air. Not on oxygen supplementation at baseline. Reorient as needed Avoid any  sedating agents. She is more alert at the time of this visit.  Answering to questions although her speech is impaired.  Acute CVA involving left pons and midbrain, additional small acute infarcts in the left greater than right cerebral hemispheres.  Seen on MRI brain done on 12/29/2020. Advanced chronic ischemia with multiple chronic infarcts as above.  Due to concern for possible endocarditis with possibility for hemorrhagic conversion, neurology Dr. Curly Shores recommended to hold off on anticoagulation for now. Patient's home has declined any invasive procedure, TEE canceled on 12/31/2020.  Newly diagnosed right-sided PE Bilateral lower extremity Doppler ultrasound negative for DVT. CTA chest confirmed right-sided pulmonary embolism. Follow 2D echo Neurology consulted anticoagulation recommendations due to the patient's history of intracranial hemorrhage. Family has declined any invasive procedure, IVC filter placement canceled on 12/31/2020. Discussed with Dr. Leonie Man, neurology/stroke team, okay to start Eliquis on 12/31/2020.  Uncontrolled hypertension in the setting of acute CVA BP is currently not at goal Gradually normalize BP Currently on p.o. Lopressor 25 mg nightly Continue to monitor vital signs.  Sepsis secondary to aspiration pneumonia, POA Presented with leukocytosis, tachycardia and tachypnea.  With evidence of aspiration pneumonia on CT chest. Witnessed coughing and gagging in the ER with concern for aspiration. CT chest done on 12/29/2020 with evidence of aspiration pneumonia, personally reviewed. She was started on Unasyn empirically in the ED, continue.  Pulmonary Speech therapist evaluation once stable Aspiration precautions Follow cultures  Dysphagia Seen by speech therapist with recommendation for dysphagia 1 diet. Continue aspiration precautions.  Acute hypoxic respiratory failure secondary to aspiration pneumonia Not on oxygen supplementation at baseline Currently on  4 L O2 saturation in the mid 90s Continue to maintain O2 saturation greater than 92%.  History of intracranial hemorrhage in 2017 Since then she has had recurrent ischemic CVA Neurology consulted and recommended to repeat an MRI brain on 12/29/2020, pending. Continue neurochecks frequently  Refractory hypokalemia Repleted intravenously. Repeat level in the morning.  Type 2 diabetes with hyperglycemia Hemoglobin A1c 8.0 on 12/25/2020. Continue insulin sliding scale.  CKD 2 Appears to be at her baseline creatinine with GFR > 60. Creatinine 1.05 with GFR of greater than 60. Avoid nephrotoxic agents Continue to continue to monitor urine output Repeat renal panel in the morning.  Bilateral below the knee amputation Continue fall precautions Frequent turning  Goals of care  Patient's son [name] change her CODE STATUS to DNR 12/29/2020. Palliative care team consulted to assist with establishing goals of care.  Code Status: Full code  Family Communication: None at bedside.  Disposition Plan: Likely will discharge to home with home with hospice care on 01/02/2021 or when neurology signs off.   Consultants:  Neurology  Palliative care team.  Procedures:  None.  Antimicrobials:  Unasyn.  DVT prophylaxis: Subcu Lovenox daily.  Status is: Inpatient   Dispo: The patient is from: Home.               Anticipated d/c is to: Likely home with hospice care on 01/02/2021 or when neurology signs off.              Patient currently, not stable for discharge due to  ongoing management of aspiration pneumonia, acute hypoxic respiratory failure, acute CVA.   Difficult to place patient, not applicable.        Objective: Vitals:   12/31/20 0344 12/31/20 0500 12/31/20 0723 12/31/20 1100  BP: 124/69 (!) 144/82 (!) 180/87 135/77  Pulse: 92 70 78 92  Resp: 20 20 20 15   Temp: 98.6 F (37 C)  98.2 F (36.8 C) 97.9 F (36.6 C)  TempSrc: Oral  Oral Oral  SpO2: 100% 100% 100% 97%   Weight:      Height:        Intake/Output Summary (Last 24 hours) at 12/31/2020 1538 Last data filed at 12/31/2020 9678 Gross per 24 hour  Intake 380 ml  Output 650 ml  Net -270 ml   Filed Weights   12/28/20 1937  Weight: 72 kg    Exam:  . General: 55 y.o. year-old female chronically ill-appearing no acute stress.  She is alert and interactive.  Her speech is impaired. . Cardiovascular: Regular rate and rhythm no rubs or gallops. Marland Kitchen Respiratory: Mild rales at bases no wheezing noted.  Poor inspiratory effort. . Abdomen: Soft nontender normal bowel sounds present.   . Musculoskeletal: Bilateral below the knee lower extremity amputation. . Skin: No ulcerative lesions noted. Marland Kitchen Psychiatry: Mood is appropriate for condition and setting.   Data Reviewed: CBC: Recent Labs  Lab 12/25/20 1055 12/28/20 2011 12/29/20 0024 12/30/20 0036 12/31/20 0609  WBC 8.7 14.4* 17.6* 15.5* 13.8*  NEUTROABS  --  12.0*  --   --   --   HGB 10.9* 11.3* 10.5* 8.8* 7.9*  HCT 33.8* 35.7* 33.1* 28.0* 25.6*  MCV 84.0 87.9 86.9 87.2 88.0  PLT 613.0* 617* 574* 476* 938*   Basic Metabolic Panel: Recent Labs  Lab 12/25/20 1055 12/28/20 2011 12/29/20 0024 12/30/20 0036 12/31/20 0609  NA 142 143 142 144 144  K 3.7 3.6 3.4* 3.0* 3.8  CL 100 103 102 105 106  CO2 32 29 25 31 29   GLUCOSE 158* 208* 286* 76 132*  BUN 18 23* 23* 21* 11  CREATININE 1.03 1.16* 1.19* 1.05* 1.16*  CALCIUM 9.2 9.0 8.7* 8.7* 7.9*  MG  --   --  1.4* 3.5*  --   PHOS  --   --   --  2.1* 3.3   GFR: Estimated Creatinine Clearance: 49.8 mL/min (A) (by C-G formula based on SCr of 1.16 mg/dL (H)). Liver Function Tests: Recent Labs  Lab 12/25/20 1055 12/28/20 2011  AST 11 21  ALT 8 16  ALKPHOS 93 81  BILITOT 0.5 1.0  PROT 6.9 7.2  ALBUMIN 3.1* 2.7*   Recent Labs  Lab 12/28/20 2011  LIPASE 31   No results for input(s): AMMONIA in the last 168 hours. Coagulation Profile: No results for input(s): INR, PROTIME in the  last 168 hours. Cardiac Enzymes: No results for input(s): CKTOTAL, CKMB, CKMBINDEX, TROPONINI in the last 168 hours. BNP (last 3 results) No results for input(s): PROBNP in the last 8760 hours. HbA1C: No results for input(s): HGBA1C in the last 72 hours. CBG: Recent Labs  Lab 12/30/20 0937 12/30/20 1443 12/30/20 2138 12/31/20 0347 12/31/20 0819  GLUCAP 133* 199* 132* 149* 125*   Lipid Profile: No results for input(s): CHOL, HDL, LDLCALC, TRIG, CHOLHDL, LDLDIRECT in the last 72 hours. Thyroid Function Tests: No results for input(s): TSH, T4TOTAL, FREET4, T3FREE, THYROIDAB in the last 72 hours. Anemia Panel: Recent Labs    12/30/20 0036  VITAMINB12 348  Urine analysis:    Component Value Date/Time   COLORURINE YELLOW 07/28/2020 0300   APPEARANCEUR HAZY (A) 07/28/2020 0300   LABSPEC 1.020 07/28/2020 0300   PHURINE 7.0 07/28/2020 0300   GLUCOSEU >=500 (A) 07/28/2020 0300   GLUCOSEU NEGATIVE 05/21/2015 0831   HGBUR NEGATIVE 07/28/2020 0300   BILIRUBINUR NEGATIVE 07/28/2020 0300   KETONESUR NEGATIVE 07/28/2020 0300   PROTEINUR >=300 (A) 07/28/2020 0300   UROBILINOGEN 0.2 05/21/2015 0831   NITRITE NEGATIVE 07/28/2020 0300   LEUKOCYTESUR NEGATIVE 07/28/2020 0300   Sepsis Labs: @LABRCNTIP (procalcitonin:4,lacticidven:4)  ) Recent Results (from the past 240 hour(s))  Resp Panel by RT-PCR (Flu A&B, Covid) Nasopharyngeal Swab     Status: None   Collection Time: 12/28/20  9:09 PM   Specimen: Nasopharyngeal Swab; Nasopharyngeal(NP) swabs in vial transport medium  Result Value Ref Range Status   SARS Coronavirus 2 by RT PCR NEGATIVE NEGATIVE Final    Comment: (NOTE) SARS-CoV-2 target nucleic acids are NOT DETECTED.  The SARS-CoV-2 RNA is generally detectable in upper respiratory specimens during the acute phase of infection. The lowest concentration of SARS-CoV-2 viral copies this assay can detect is 138 copies/mL. A negative result does not preclude SARS-Cov-2 infection  and should not be used as the sole basis for treatment or other patient management decisions. A negative result may occur with  improper specimen collection/handling, submission of specimen other than nasopharyngeal swab, presence of viral mutation(s) within the areas targeted by this assay, and inadequate number of viral copies(<138 copies/mL). A negative result must be combined with clinical observations, patient history, and epidemiological information. The expected result is Negative.  Fact Sheet for Patients:  EntrepreneurPulse.com.au  Fact Sheet for Healthcare Providers:  IncredibleEmployment.be  This test is no t yet approved or cleared by the [place] FDA and  has been authorized for detection and/or diagnosis of SARS-CoV-2 by FDA under an Emergency Use Authorization (EUA). This EUA will remain  in effect (meaning this test can be used) for the duration of the COVID-19 declaration under Section 564(b)(1) of the Act, 21 U.S.C.section 360bbb-3(b)(1), unless the authorization is terminated  or revoked sooner.       Influenza A by PCR NEGATIVE NEGATIVE Final   Influenza B by PCR NEGATIVE NEGATIVE Final    Comment: (NOTE) The Xpert Xpress SARS-CoV-2/FLU/RSV plus assay is intended as an aid in the diagnosis of influenza from Nasopharyngeal swab specimens and should not be used as a sole basis for treatment. Nasal washings and aspirates are unacceptable for Xpert Xpress SARS-CoV-2/FLU/RSV testing.  Fact Sheet for Patients: EntrepreneurPulse.com.au  Fact Sheet for Healthcare Providers: IncredibleEmployment.be  This test is not yet approved or cleared by the [place] FDA and has been authorized for detection and/or diagnosis of SARS-CoV-2 by FDA under an Emergency Use Authorization (EUA). This EUA will remain in effect (meaning this test can be used) for the duration of the COVID-19 declaration  under Section 564(b)(1) of the Act, 21 U.S.C. section 360bbb-3(b)(1), unless the authorization is terminated or revoked.  Performed at [facility], [place] [address], [place], [place] [address]   Blood culture (routine x 2)     Status: None (Preliminary result)   Collection Time: 12/28/20  9:09 PM   Specimen: BLOOD  Result Value Ref Range Status   Specimen Description BLOOD RIGHT ANTECUBITAL  Final   Special Requests   Final    BOTTLES DRAWN AEROBIC AND ANAEROBIC Blood Culture results may not be optimal due to an excessive volume of blood  received in culture bottles   Culture   Final    NO GROWTH 3 DAYS Performed at [facility], [place] [address], [place], [place] [address]    Report Status PENDING  Incomplete  Blood culture (routine x 2)     Status: None (Preliminary result)   Collection Time: 12/28/20  9:09 PM   Specimen: BLOOD RIGHT FOREARM  Result Value Ref Range Status   Specimen Description BLOOD RIGHT FOREARM  Final   Special Requests   Final    BOTTLES DRAWN AEROBIC AND ANAEROBIC Blood Culture results may not be optimal due to an excessive volume of blood received in culture bottles   Culture   Final    NO GROWTH 3 DAYS Performed at [facility], [place] [address], [place], [place] [address]    Report Status PENDING  Incomplete      Studies: No results found.  Scheduled Meds: . acetaminophen  650 mg Oral Once  . amLODipine  10 mg Oral Daily  . atorvastatin  80 mg Oral Daily  . Chlorhexidine Gluconate Cloth  6 each Topical Daily  . cyanocobalamin  1,000 mcg Intramuscular Daily  . escitalopram  10 mg Oral QPM  . ferrous sulfate  325 mg Oral Q breakfast  . glycopyrrolate  0.2 mg Intravenous Q4H  . insulin aspart  0-15 Units Subcutaneous Q6H  . metoprolol tartrate  25 mg Oral QPM  . [START ON 01/01/2021] thiamine  100 mg Oral Daily    Continuous Infusions: . ampicillin-sulbactam (UNASYN) IV 3 g (12/31/20 0909)  . dextrose 5% lactated ringers with KCl  20 mEq/L 50 mL/hr at 12/31/20 1133     LOS: 3 days     Kayleen Memos, MD [facility] Pager [phone]  If 7PM-7AM, please contact night-coverage [email or web address] Password [facility] 12/31/2020, 3:38 PM

## 2020-12-31 NOTE — Progress Notes (Addendum)
STROKE TEAM PROGRESS NOTE   INTERVAL HISTORY Patient is awake and alert sitting up  in bed in Oncology nasal canula oxugen No family at bedside.   Palliative care meeting yesterday made patient DNR/DNI but continue present measures of support.  Family to meet again today afternoon for further discussions about goals of care.  Patient has not been started on IV heparin till family makes decision about goals of care  Vitals:   12/31/20 0344 12/31/20 0500 12/31/20 0723 12/31/20 1100  BP: 124/69 (!) 144/82 (!) 180/87 135/77  Pulse: 92 70 78 92  Resp: 20 20 20 15   Temp: 98.6 F (37 C)  98.2 F (36.8 C) 97.9 F (36.6 C)  TempSrc: Oral  Oral Oral  SpO2: 100% 100% 100% 97%  Weight:      Height:       CBC:  Recent Labs  Lab 12/28/20 2011 12/29/20 0024 12/30/20 0036 12/31/20 0609  WBC 14.4*   < > 15.5* 13.8*  NEUTROABS 12.0*  --   --   --   HGB 11.3*   < > 8.8* 7.9*  HCT 35.7*   < > 28.0* 25.6*  MCV 87.9   < > 87.2 88.0  PLT 617*   < > 476* 455*   < > = values in this interval not displayed.   Basic Metabolic Panel:  Recent Labs  Lab 12/29/20 0024 12/30/20 0036 12/31/20 0609  NA 142 144 144  K 3.4* 3.0* 3.8  CL 102 105 106  CO2 25 31 29   GLUCOSE 286* 76 132*  BUN 23* 21* 11  CREATININE 1.19* 1.05* 1.16*  CALCIUM 8.7* 8.7* 7.9*  MG 1.4* 3.5*  --   PHOS  --  2.1* 3.3   Lipid Panel:  Recent Labs  Lab 12/25/20 1055  CHOL 270*  TRIG 185.0*  HDL 37.70*  CHOLHDL 7  VLDL 37.0  LDLCALC 196*   HgbA1c:  Recent Labs  Lab 12/25/20 1055  HGBA1C 8.0*   Urine Drug Screen: No results for input(s): LABOPIA, COCAINSCRNUR, LABBENZ, AMPHETMU, THCU, LABBARB in the last 168 hours.  Alcohol Level No results for input(s): ETH in the last 168 hours.  IMAGING past 24 hours No results found.   EEG Description: No clear posterior dominant rhythm was seen. EEG showed continuous generalized polymorphic sharply contoured 3 to 6 Hz theta-delta slowing. Hyperventilation and photic  stimulation were not performed.     ABNORMALITY - Continuous slow, generalized  IMPRESSION: This study is suggestive of moderate diffuse encephalopathy, nonspecific etiology but likely related to underlying stokes. No seizures or epileptiform discharges were seen throughout the recording.      PHYSICAL EXAM  Physical Exam  Constitutional: Pleasant middle-age Caucasian lady psych: Flat Affect Eyes: No scleral injection HENT: No OP obstrucion MSK: no joint deformities.  Cardiovascular: Normal rate and regular rhythm.  Respiratory: Effort normal, non-labored breathing GI: Soft.  No distension. There is no tenderness.  Skin: WDI Bilateral below-knee amputation.  Neuro: Mental Status: Patient is awake, alert, oriented to person.  Diminished attention, registration and recall Patient is not able to give a clear and coherent history. No signs of aphasia or neglect Cranial Nerves: II: Visual Fields are full. Pupils are not equal, left is 4 millimeters and nonreactive, right 3 millimeters reactive, round, III,IV, VI: EOM not intact. Eyes don't cross midline to right, without ptosis or diploplia.  V: Facial sensation is symmetric to temperature VII: Facial movement is symmetric.  VIII: hearing is intact to voice  X: Uvula elevates symmetrically XI: Shoulder shrug is symmetric. XII: tongue is midline without atrophy or fasciculations.  Motor: Tone is normal. Bulk is normal. 4/5 strength was present in all four extremities but right side slightly weaker than left. Has bilateral BKA Sensory: Sensation is symmetric to light touch and temperature in the arms and legs. Cerebellar: FNF unable to assess  ASSESSMENT/PLAN [name] is a 55 y.o. female with history of significant vascular disease (bilateral BKA, coronary artery disease, multiple prior strokes), diabetes, hypertension, hyperlipidemia, obesity, hypothyroidism.  She is bedbound at baseline. She presented to the ED  on 4/22 due to being slow to awaken in the morning, and then having cough and increased shortness of breath, severe aspiration event, emesis (nonbloody, yellow fluid), requiring suctioning on EMS arrival with an O2 saturation of 85%.  She was febrile to 101.6 in the ED, with a leukocytosis to 14 and age indeterminant infarct of the left pons (new since November 2021) seen on head CT.  She was initially normotensive but then became quite hypertensive, with significant lability of her blood pressure  Acute Pons/Midbrain Stroke  left  infarct embolic secondary to small vessel disease source  CT head  A lacunar infarct within the left pons and pontomesencephalic junction is new from the prior head CT of 07/28/2020, but otherwise age indeterminate. Consider a brain MRI for further evaluation. Stable background generalized parenchymal atrophy and chronic ischemic disease with multiple chronic infarcts, as described.  CTA head  Known acute infarct in the left pons. Chronic small vessel ischemia with chronic lacunar infarcts in the deep gray nuclei and deep white matter. Chronic moderate size right occipital parietal cortex infarct. No hemorrhage or visible progression. No Hydrocephalus   CTA neck No emergent vascular finding. 2. Advanced intracranial atherosclerosis with high-grade right V4, right P1, right P2, and right MCA narrowings. Atherosclerotic disease has progressed from 2021. 3. Cervical carotid atherosclerosis with proximal ICA stenosis measuring 40% on the right and 35% on the left. 4. Right upper lobe pneumonia.  MRI   1. Moderate-sized acute infarct in the left pons and midbrain. 2. Additional small acute infarcts in the left greater than right cerebral hemispheres. 3. Advanced chronic ischemia with multiple chronic infarcts as above.  2D Echo ordered  LDL 196  HgbA1c 8.0  VTE prophylaxis - SCD    Diet   DIET - DYS 1 Room service appropriate? Yes; Fluid consistency:  Nectar Thick    aspirin 81 mg daily prior to admission, now on aspirin 81 mg daily.   Therapy recommendations:  PT/OT  Disposition:  Pending therapy recs and family decisions for GOC  Hypertension  Home meds:  norvasc 10mg  daily, cozaar 50mg  daily   Stable . Permissive hypertension (OK if < 220/120) but gradually normalize in 5-7 days . Long-term BP goal normotensive  Hyperlipidemia  Home meds:  lipitor 80mg  , resumed in hospital  LDL 196, goal <70  Continue statin at discharge  Diabetes type II UnControlled  Home meds:  metformin  HgbA1c 8.0, goal < 7.0  CBGs Recent Labs    12/30/20 2138 12/31/20 0347 12/31/20 0819  GLUCAP 132* 149* 125*      SSI  Other Stroke Risk Factors  Cigarette smoker*advised to stop smoking  ETOH use, alcohol level <10, advised to drink no more than 1 drink(s) a day  Hx stroke/TIA  Coronary artery disease  Other Active Problems  Aspiration Pna- per primary team   Type 2 DM -[facility]  day # 3 Patient presented with left pontine and midbrain as well as bilateral subcortical lacunar infarcts.  She has prior history of lacunar disease as well.  CT angiogram of the chest also shows concurrent pulmonary emboli.  Patient may need anticoagulation for this but given her poor baseline functioning family is meeting with palliative care to decide on goals of care.  If family chooses to be aggressive would recommend anticoagulation with Eliquis.  Discussed with Dr. [place] Crane.  Greater than 50% time during this 25-minute visit was spent on counseling and coordination of care about her lacunar strokes and pulmonary embolism and discussion about treatment and answering questions.  Antony Contras MD  To contact Stroke Continuity provider, please refer to [email or web address] After hours, contact General Neurology

## 2020-12-31 NOTE — Telephone Encounter (Signed)
[name] (home health aid) states patient is currently in the hospital and they talk about comfort care/hospice. [name] is wants to see if you are able to take a look at her chart.. She is concerned about on how quickly her health has deteriorated

## 2020-12-31 NOTE — Telephone Encounter (Signed)
Spoke with [name] she is aware

## 2020-12-31 NOTE — Telephone Encounter (Signed)
See below

## 2020-12-31 NOTE — Progress Notes (Addendum)
  Speech Language Pathology Treatment: Dysphagia  Patient Details Name: OMAIRA Davis MRN: [id number] DOB: 05-03-1966 Today's Date: 12/31/2020 Time: 5615-3794 SLP Time Calculation (min) (ACUTE ONLY): 15 min  Assessment / Plan / Recommendation Clinical Impression  Pt appears to be having increased difficulty with swallowing today. Upon SLP arrival, her voice is dysphonic (barely above a whisper) and wet. She attempts to cough when cued, but most of the time she is not able to generate adequate subglottic pressure to produce an audible cough. She did produce frothy secretions x1 that SLP could then remove via yankauer. Attempted to provide single, small pieces of ice to further aid in secretion management, but pt's voice became increasingly wet with minimal amounts of ice or water. Immediate, stronger coughing was noted immediately after swallowing small amounts of puree, also followed by pt gasping for air. Given clinical presentation, would typically recommend that pt remain NPO pending completion of MBS; however, note that pt/family met with palliative care again today and may be leaning toward comfort care. They have declined other tests that were more invasive already. Would hold on completing testing or providing PO trials until we have a clear picture of GOC (MD messaged for clarification). If family would like to accept aspiration risk and pursue comfort feedings, could resume diet of their choice - although would consider not advancing beyond purees.   HPI HPI: Patient is a 55 y.o. female with PMH: DM-2 with vascular disease, HTN, h/o CVA (2021), bilateral BKA, ICH, CAD, hypothyroidism who lives with son who is her caregiver. (patient is bedbound) She was brought to ED secondary to son observing increasing SOB, vomit of nonbloody emesis followed by patient starting to gurgle which prompted him to call EMS. When EMS arrived they suctioned significant amount of thick yellowish phlegm from back of  her throat; oxygen saturations were 85%. She was febrile at 101/6 in ED and with elevated WBC count. CXR negative for active disease, MRI revealed Moderate-sized acute infarct in the left pons and midbrain, additional small acute infarcts in left greater than right cerebral hemispheres.      SLP Plan  Continue with current plan of care       Recommendations  Diet recommendations: NPO (unless opting for comfort feeds) Medication Administration: Via alternative means                Oral Care Recommendations: Oral care QID Follow up Recommendations:  (tba) SLP Visit Diagnosis: Dysphagia, unspecified (R13.10) Plan: Continue with current plan of care       GO                Osie Bond., M.A. [facility] Pager [phone] Office [phone]  12/31/2020, 4:17 PM

## 2020-12-31 NOTE — Progress Notes (Addendum)
   IR has received request for IVC filter placement  Pt with new CVA New Rt PE +PNA  Hx B BKA; Hx ICH 2017  To ED after slow to awaken and cough; aspiration; gurgling noises EMS did suction out large amount mucus Son says she has had difficulty swallowing for few months now  Admission from ED Found to have aspiration PNA; PE; acute infarct Neg DVT BLE Possible endocarditis--- with possibility for hemorrhagic conversion Holding anticoagulation for PE at this point  Palliative medicine has seen pt-- she is DNR/ DNI  Discussed with Dr Dwaine Gale  He then talked with Dr [place] Crane  Plan is to get TEE -- if + for endocarditis- would likely consider IVC filter in IR                               -- if - for endocarditis-- no indication for filter as could anticoagulate.  I went to see pt this am She is unable to discuss with me indications/plans for possible IVC filter I called son [name]- unable to get him at this time.  Will await TEE results and will retry son then.

## 2021-01-01 LAB — CBC
HCT: 26.4 % — ABNORMAL LOW (ref 36.0–46.0)
HCT: 27.5 % — ABNORMAL LOW (ref 36.0–46.0)
Hemoglobin: 8.1 g/dL — ABNORMAL LOW (ref 12.0–15.0)
Hemoglobin: 8.3 g/dL — ABNORMAL LOW (ref 12.0–15.0)
MCH: 27.4 pg (ref 26.0–34.0)
MCH: 27 pg (ref 26.0–34.0)
MCHC: 30.7 g/dL (ref 30.0–36.0)
MCHC: 30.2 g/dL (ref 30.0–36.0)
MCV: 89.2 fL (ref 80.0–100.0)
MCV: 89.6 fL (ref 80.0–100.0)
Platelets: 433 10*3/uL — ABNORMAL HIGH (ref 150–400)
Platelets: 469 10*3/uL — ABNORMAL HIGH (ref 150–400)
RBC: 2.96 MIL/uL — ABNORMAL LOW (ref 3.87–5.11)
RBC: 3.07 MIL/uL — ABNORMAL LOW (ref 3.87–5.11)
RDW: 14.7 % (ref 11.5–15.5)
RDW: 14.6 % (ref 11.5–15.5)
WBC: 10.1 10*3/uL (ref 4.0–10.5)
WBC: 9.4 10*3/uL (ref 4.0–10.5)
nRBC: 0 % (ref 0.0–0.2)
nRBC: 0 % (ref 0.0–0.2)

## 2021-01-01 LAB — GLUCOSE, CAPILLARY
Glucose-Capillary: 135 mg/dL — ABNORMAL HIGH (ref 70–99)
Glucose-Capillary: 139 mg/dL — ABNORMAL HIGH (ref 70–99)
Glucose-Capillary: 103 mg/dL — ABNORMAL HIGH (ref 70–99)

## 2021-01-01 LAB — PROCALCITONIN: Procalcitonin: 3.4 ng/mL

## 2021-01-01 LAB — BASIC METABOLIC PANEL
Anion gap: 9 (ref 5–15)
BUN: 12 mg/dL (ref 6–20)
CO2: 28 mmol/L (ref 22–32)
Calcium: 7.9 mg/dL — ABNORMAL LOW (ref 8.9–10.3)
Chloride: 107 mmol/L (ref 98–111)
Creatinine, Ser: 1.54 mg/dL — ABNORMAL HIGH (ref 0.44–1.00)
GFR, Estimated: 39 mL/min — ABNORMAL LOW (ref >60)
Glucose, Bld: 153 mg/dL — ABNORMAL HIGH (ref 70–99)
Potassium: 4.1 mmol/L (ref 3.5–5.1)
Sodium: 144 mmol/L (ref 135–145)

## 2021-01-01 LAB — MAGNESIUM: Magnesium: 1.9 mg/dL (ref 1.7–2.4)

## 2021-01-01 MED ORDER — PANTOPRAZOLE SODIUM 40 MG IV SOLR
40.0000 mg | INTRAVENOUS | Status: DC
  Administered 2021-01-01 – 2021-01-04 (×4): 40 mg via INTRAVENOUS
  Filled 2021-01-01 (×4): qty 40

## 2021-01-01 MED ORDER — ENOXAPARIN SODIUM 80 MG/0.8ML ~~LOC~~ SOLN
70.0000 mg | Freq: Two times a day (BID) | SUBCUTANEOUS | Status: DC
  Administered 2021-01-01 – 2021-01-04 (×6): 70 mg via SUBCUTANEOUS
  Filled 2021-01-01 (×6): qty 0.8

## 2021-01-01 MED ORDER — DEXTROSE IN LACTATED RINGERS 5 % IV SOLN: INTRAVENOUS | Status: AC

## 2021-01-01 MED ORDER — ENOXAPARIN SODIUM 80 MG/0.8ML ~~LOC~~ SOLN
70.0000 mg | Freq: Two times a day (BID) | SUBCUTANEOUS | 0 refills | Status: DC
  Filled 2021-01-01: qty 48, 30d supply, fill #0

## 2021-01-01 MED ORDER — METOPROLOL TARTRATE 5 MG/5ML IV SOLN
2.5000 mg | Freq: Three times a day (TID) | INTRAVENOUS | Status: DC
  Administered 2021-01-01 – 2021-01-04 (×9): 2.5 mg via INTRAVENOUS
  Filled 2021-01-01 (×9): qty 5

## 2021-01-01 NOTE — Progress Notes (Signed)
SLP Cancellation Note  Patient Details Name: [name] MRN: [id number] DOB: 20-Dec-1965   Cancelled treatment:       Reason Eval/Treat Not Completed: Other (comment) Per chart, note that pt/family have chosen to go home with hospice services. Per MD note, pt not tolerating any PO intake. RN confirms the plan to focus on comfort and says that family has not been at the hospital today. Will try to f/u to offer education to family prior to discharge.     Osie Bond., M.A. [facility] Pager [phone] Office [phone]  01/01/2021, 3:33 PM

## 2021-01-01 NOTE — Progress Notes (Signed)
This chaplain responded to PMT consult for spiritual care as the Pt. and son navigate next steps with home hospice.  The chaplain is appreciative of the RN-Michelle introduction to the Pt. and her listening presence. Permission was received from the Pt. for the chaplain to hold her hand. The Pt. soft voice is often not audible and yet the Pt. continues to try to communicate as long as I offer a listening presence. The Pt. clearly affirmed her faith and acknowledged her relationship with her son and parents.   The Pt. accepted the chaplain's invitation to read scripture together and for F/U spiritual care.

## 2021-01-01 NOTE — Progress Notes (Signed)
ANTICOAGULATION CONSULT NOTE - Initial Consult  Pharmacy Consult for Apixaban > changing to Lovenox Indication: pulmonary embolus  Allergies  Allergen Reactions  . Lyrica [Pregabalin] Other (See Comments)    MADE PATIENT VERY EMOTIONAL AND WOULD CRY EASILY  . Morphine And Related Nausea And Vomiting  . Penicillins Other (See Comments)    GI upset related to Augmentin use  Did it involve swelling of the face/tongue/throat, SOB, or low BP? No Did it involve sudden or severe rash/hives, skin peeling, or any reaction on the inside of your mouth or nose? No Did you need to seek medical attention at a hospital or doctor's office? No When did it last happen?April 2020 If all above answers are "NO", may proceed with cephalosporin use.   . Tramadol Nausea And Vomiting    Pt cant tolerate this pain med.     Patient Measurements: Height: 5\' 2"  (157.5 cm) Weight: 72 kg (158 lb 11.7 oz) IBW/kg (Calculated) : 50.1  Vital Signs: Temp Source: Oral (04/26 0805) BP: 179/90 (04/26 0805) Pulse Rate: 69 (04/26 0805)  Labs: Recent Labs    12/30/20 0036 12/31/20 0609 01/01/21 0550  HGB 8.8* 7.9* 8.1*  HCT 28.0* 25.6* 26.4*  PLT 476* 455* 433*  CREATININE 1.05* 1.16* 1.54*    Estimated Creatinine Clearance: 37.5 mL/min (A) (by C-G formula based on SCr of 1.54 mg/dL (H)).   Medical History: Past Medical History:  Diagnosis Date  . Abscess of great toe, right   . Acute on chronic diastolic CHF (congestive heart failure) ([place]) 06/24/2018  . Acute osteomyelitis of toe, left ([place]) 09/05/2016  . Amputation of right great toe ([place]) 12/22/2018  . Anxiety   . Cataract    left - surgery to remove  . Cellulitis of foot, right 12/11/2018  . COMMON MIGRAINE 10/07/2010  . Coronary artery disease   . Decreased visual acuity 11/10/2016  . Depression 12/18/2012  . Diabetes mellitus type II, uncontrolled ([place]) 10/07/2010   Qualifier: Diagnosis of  By: Charlett Blake MD, [name]    . Diabetic foot  infection ([facility]) 08/26/2016  . Diabetic infection of right foot ([place])   . Disturbance of skin sensation 10/07/2010  . Gangrene of right foot ([place])   . GERD (gastroesophageal reflux disease)   . Heart murmur   . History of kidney stones    "years ago" - passed stones  . Hyperlipidemia 12/06/2010  . Hypertension   . Hypothyroidism   . Lipoma of abdominal wall 10/05/2016  . Overweight(278.02) 12/06/2010  . PERIPHERAL NEUROPATHY, FEET 10/07/2010   11/17/2019  . PVD (peripheral vascular disease) ([place]) 01/21/2012  . RESTLESS LEG SYNDROME 10/25/2010  . Stroke [facility]) 2014, 2017   most recently in 2/17 - intracerebral hemorrhage      Assessment: 55yof admitted with cough and started on antibiotics.  She had MS changes and found to have CVA, s/p chest CT found to have PE  Discussion with MD for risk of hemorrhagic - and higher dose apixaban for PE dosing, OK to treat PE.  Patient unable to take anything PO today, and pharmacy asked to change apixaban to enoxaparin injections.  Last dose of apixaban was last PM.  Est CrCl ~ 35 ml/min  Goal of Therapy:   Monitor platelets by anticoagulation protocol: Yes   Plan:  Lovenox 70 mg sq q 12 hrs. CBC q 72 hrs while on Lovenox.  [place] Crane, Roylene Reason, BCCP Clinical Pharmacist  01/01/2021 3:39 PM   [facility] pharmacy phone numbers are listed  on [email or web address]

## 2021-01-01 NOTE — TOC Initial Note (Signed)
Transition of Care (TOC) - Initial/Assessment Note  Marvetta Gibbons RN, BSN Transitions of Care Unit 4E- RN Case Manager See Treatment Team for direct phone #    Patient Details  Name: [name] MRN: [id number] Date of Birth: Jan 17, 1966  Transition of Care [employer]) CM/SW Contact:    Dawayne Patricia, RN Phone Number: 01/01/2021, 4:46 PM  Clinical Narrative:                 Noted referral for Home Hospice needs, several attempts have been made to reach son-Cory to discuss home Hospice needs and offer choice- unable to leave msg as VM full.  68 - son returned call to this Probation officer, discussed transition plans - son has confirmed family is agreeable to home with hospice. Plan is for patient to go to parents home- address [address] [phone] or [phone]) Son reports that they will still have caregiver who is a CNA come assist with caring for his mom in the home. They are moving hospital bed today to the parents home. Son is requesting w/c, and per MD pt will also need home 02 arranged.   Pt was active with [facility] prior to admit- discuss with son Home Hospice choice also discussed option of using [facility] since they are familiar with pt- son is open to having [facility] contact him to discuss services, Call made to [place] with [facility] who will reach out to son- Hoyle Sauer will f/u with TOC to confirm if son will be using them for Home Hospice needs.   Pt will need EMS transport home, and GOLD DNR.    Expected Discharge Plan: Home w Hospice Care Barriers to Discharge: Continued Medical Work up   Patient Goals and CMS Choice   CMS Medicare.gov Compare Post Acute Care list provided to:: Patient Represenative (must comment) Choice offered to / list presented to : Adult Children  Expected Discharge Plan and Services Expected Discharge Plan: Home w Hospice Care   Discharge Planning Services: CM Consult Post Acute Care Choice: Hospice,Durable Medical  Equipment Living arrangements for the past 2 months: Single Family Home                 DME Arranged: Environmental consultant rolling,Oxygen       Representative spoke with at DME Agency: per Hospice HH Arranged: Disease Management [place] Agency: [place] Date [place]: 01/01/21 Time [place]: 1445 Representative spoke with at [place]: Hoyle Sauer  Prior Living Arrangements/Services Living arrangements for the past 2 months: [place] with:: Relatives,Parents Patient language and need for interpreter reviewed:: Yes Do you feel safe going back to the place where you live?: Yes      Need for Family Participation in Patient Care: Yes (Comment) Care giver support system in place?: Yes (comment) Current home services: DME Criminal Activity/Legal Involvement Pertinent to Current Situation/Hospitalization: No - Comment as needed  Activities of Daily Living      Permission Sought/Granted Permission sought to share information with : Facility Art therapist granted to share information with : Yes, Verbal Permission Granted     Permission granted to share info w AGENCY: [facility]        Emotional Assessment Appearance:: Appears older than stated age Attitude/Demeanor/Rapport: Unable to Assess Affect (typically observed): Unable to Assess Orientation: : Oriented to Self Alcohol / Substance Use: Not Applicable Psych Involvement: No (comment)  Admission diagnosis:  Aspiration pneumonia [facility]) [J69.0] Patient Active Problem List  Diagnosis Date Noted  . Pneumonia due to infectious organism   . Palliative care by specialist   . Pressure injury of skin 12/30/2020  . Aspiration pneumonia ([place]) 12/28/2020  . Lacunar infarction ([place]) 12/28/2020  . Thrombocytosis 12/28/2020  . Respiratory distress 12/28/2020  . Dysphagia 12/26/2020  . Acute pain of right shoulder 12/26/2020  . Leukocytosis   . AMS (altered mental status) 07/28/2020  .  Cerebral thrombosis with cerebral infarction 07/28/2020  . Candidal skin infection 04/01/2020  . Polyradiculoneuropathy ([place]) 01/23/2020  . Sun-damaged skin 01/23/2020  . Uncontrolled type 2 diabetes mellitus with hyperglycemia ([place]) 11/25/2019  . Wound infection after surgery 11/25/2019  . Adjustment disorder with mixed anxiety and depressed mood   . S/P bilateral BKA (below knee amputation) ([place]) 09/27/2019  . Acute osteomyelitis of left foot ([place]) 09/21/2019  . Subacute osteomyelitis, left ankle and foot ([place])   . Hx of right BKA ([place]) 02/09/2019  . Diabetes mellitus type 2 in nonobese (H[facility])   . Labile blood pressure   . Phantom limb pain ([place])   . CKD (chronic kidney disease), stage III ([place])   . Hypothyroidism   . Chronic diastolic congestive heart failure ([place])   . Coronary artery disease involving coronary bypass graft of native heart without angina pectoris   . Poorly controlled type 2 diabetes mellitus with peripheral neuropathy ([place])   . Acute on chronic anemia   . History of below knee amputation, right ([place]) 12/31/2018  . S/P CABG x 3 06/28/2018  . Coronary artery disease involving native coronary artery of native heart with angina pectoris ([place]) 06/25/2018  . Type 2 diabetes mellitus with vascular disease ([place]) 06/24/2018  . Acute on chronic diastolic CHF (congestive heart failure) ([place]) 06/24/2018  . Non-ST elevation (NSTEMI) myocardial infarction ([place]) 06/24/2018  . Chest pain 06/23/2018  . Glaucoma 09/17/2017  . Diabetic retinopathy ([place]) 09/17/2017  . Otitis media 11/10/2016  . Decreased visual acuity 11/10/2016  . Spinal stenosis, lumbar region with neurogenic claudication 10/22/2016  . Other spondylosis with radiculopathy, lumbar region 10/13/2016  . Lipoma of abdominal wall 10/05/2016  . Severe protein-calorie malnutrition ([place]) 09/04/2016  . Anemia of chronic disease 09/04/2016  . Diabetic polyneuropathy associated with type 2 diabetes mellitus ([place])  05/07/2016  . Orthostatic hypotension 05/07/2016  . Recurrent strokes ([place]) 04/27/2016  . N&V (nausea and vomiting) 04/01/2016  . ICH (intracerebral hemorrhage) ([place]) 03/30/2016  . Visit for preventive health examination 05/21/2015  . Goals of care, counseling/discussion 05/21/2015  . Arm lesion 07/02/2014  . Pain in limb 01/13/2014  . Neuropathic pain of both legs 01/13/2014  . Hip pain 01/12/2014  . Allergic rhinitis 03/10/2013  . Back pain 03/08/2013  . Depression 12/18/2012  . PVD (peripheral vascular disease) ([place]) 01/21/2012  . Hyperlipidemia 12/06/2010  . Overweight(278.02) 12/06/2010  . RESTLESS LEG SYNDROME 10/25/2010  . Migraine without aura 10/07/2010  . Hereditary and idiopathic peripheral neuropathy 10/07/2010  . Essential hypertension, benign 10/07/2010  . DISTURBANCE OF SKIN SENSATION 10/07/2010  . HEART MURMUR, HX OF 10/07/2010  . NEPHROLITHIASIS, HX OF 10/07/2010   PCP:  Mosie Lukes, MD Pharmacy:   [id number] - [place], [place] - [name] [place] [address] Phone: [phone] Fax: [phone]     Social Determinants of Health (SDOH) Interventions    Readmission Risk Interventions Readmission Risk Prevention Plan 12/14/2018 07/06/2018  Transportation Screening Complete Complete  PCP or Specialist Appt within  5-7 Days - Complete  PCP or Specialist Appt within 3-5 Days Complete -  Home Care Screening - Complete  Medication Review (RN CM) - Complete  HRI or Home Care Consult Complete -  Social Work Consult for Recovery Care Planning/Counseling Complete -  Palliative Care Screening Not Applicable -  Some recent data might be hidden

## 2021-01-01 NOTE — Progress Notes (Signed)
PROGRESS NOTE  [name]:[id number] DOB: 1966-09-05 DOA: 12/28/2020 PCP: Mosie Lukes, MD  HPI/Recap of past 24 hours: [name] is a 55 y.o. female with medical history significant for DMT2 with vascular disease, HTN, Hx of hemorrhagic and recurrent ischemic CVA, hx of bilateral BKA, CAD, hypothyroidism who presented from home where she lives with her son who is also her primary caregiver with generalized weakness and minimal responsiveness associated with productive cough increasing shortness of breath and vomiting with concern for aspiration.  When EMS arrived at her home, her O2 saturation was 85% on room air.  Not on oxygen supplementation at baseline.  Work-up in the ED revealed sepsis secondary to aspiration pneumonia.  CT head revealed lacunar infarct within the left pons and pontomesencephalic junction of unknown age.  MRI brain revealed moderate sized acute infarct in the left pons and midbrain, additional small acute infarcts in the left greater than right cerebral hemispheres.  Advanced chronic ischemia with multiple chronic infarcts as above.  Due to concern for possible endocarditis with possibility for hemorrhagic conversion, initially, neurology Dr. Curly Shores recommended to hold off on anticoagulation.  On 12/31/2020, patient declined any invasive procedures, TEE and IVC filter placement were canceled.  Due to her acute hypoxemia CTA PE was done on 12/30/20, which revealed acute right-sided PE.  She was started on Eliquis on 12/31/2020.  Palliative care team is following.  Her son is leaning towards hospice care.  Plan is to discharge to her parents house with hospice care.   01/01/21:   Seen and examined at the bedside.  She has not been able to tolerate any oral intake.  She was seen by speech therapist with recommendation for dysphagia 1 puree and nectar thick liquids.  Has not been able to tolerate.  Oral medications have been converted to IV.  Aspiration precautions  are in place.   Assessment/Plan: Principal Problem:   Aspiration pneumonia (HCC) Active Problems:   Essential hypertension, benign   Goals of care, counseling/discussion   Anemia of chronic disease   Type 2 diabetes mellitus with vascular disease (HCC)   S/P bilateral BKA (below knee amputation) ([place])   Lacunar infarction (HCC)   Thrombocytosis   Respiratory distress   Pressure injury of skin   Pneumonia due to infectious organism   Palliative care by specialist   Improving acute metabolic encephalopathy, suspect multifactorial secondary to acute CVA, acute illness, aspiration pneumonia, acute hypoxemia, acute R side PE. Onset at home prior to presentation. Hypoxemic on presentation O2 saturation in the mid 80s on room air. Not on oxygen supplementation at baseline. On Unasyn for aspiration pneumonia. Reorient as needed Avoid any sedating agents. She is more alert at the time of this visit.  Answering to questions although her speech is impaired.  AKI on CKD 2 suspect prerenal in the setting of poor oral intake with dehydration. Baseline creatinine appears to be 1.0 with GFR greater than 60.   This morning creatinine up rising 1.5 with GFR of 39.   Started IV fluid hydration LR D5 at 75 cc/h x 2 days.   Monitor urine output Avoid nephrotoxic agents. Repeat renal panel in the morning.  Acute CVA involving left pons and midbrain, additional small acute infarcts in the left greater than right cerebral hemispheres.  Seen on MRI brain done on 12/29/2020. History of intracranial hemorrhage in 2017 and recurrent ischemic strokes.   Advanced chronic ischemia with multiple chronic infarcts as above.  Due to concern  for possible endocarditis with possibility for hemorrhagic conversion, initially, neurology Dr. Curly Shores recommended to hold off on anticoagulation. Patient's son declined any invasive procedure, TEE and IVC filter placement canceled on 12/31/2020. She is currently on Lipitor  80 mg daily and Eliquis   Newly diagnosed acute right-sided PE Bilateral lower extremity Doppler ultrasound negative for DVT. CTA chest confirmed acute right-sided pulmonary embolism. Neurology Dr. Leonie Man recommended Eliquis, started on 12/31/20. Family has declined any invasive procedure, IVC filter placement canceled on 12/31/2020.  Uncontrolled hypertension in the setting of acute CVA BP is currently not at goal Gradually normalize BP Unable to tolerate oral intake. Scheduled IV Lopressor started on 01/01/2021. IV labetalol as needed with parameters in place. Continue to monitor vital signs.  Sepsis, resolved, secondary to aspiration pneumonia, POA Presented with leukocytosis, tachycardia and tachypnea.  With evidence of aspiration pneumonia on CT chest. Witnessed coughing and gagging in the ER with concern for aspiration. CT chest done on 12/29/2020 with evidence of aspiration pneumonia, personally reviewed. She was started on Unasyn empirically in the ED, continue.   Continue aspiration precautions Afebrile for the past 48 hours Leukocytosis has resolved. Procalcitonin is downtrending 3.40 from 12.16 on 4/23.  Severe dysphagia with concern for aspiration. Seen by speech therapist with recommendation for dysphagia 1 diet and nectar thick liquids. Continue aspiration precautions.  Acute hypoxic respiratory failure secondary to aspiration pneumonia Not on oxygen supplementation at baseline Currently on 4 L O2 nasal with O2 saturation 100%. Wean off oxygen supplementation as tolerated. Continue to maintain O2 saturation greater than 92%.  History of intracranial hemorrhage in 2017 Since then she has had recurrent ischemic CVA Seen by neurology.  Resolved post repletion: Refractory hypokalemia Repleted intravenously.  Type 2 diabetes with hyperglycemia Hemoglobin A1c 8.0 on 12/25/2020. Continue insulin sliding scale as needed. Avoid hypoglycemia in the setting of poor oral  intake due to severe dysphagia  Bilateral below the knee amputation Continue fall precautions Frequent turning to avoid pressure wounds.  Goals of care  Patient's son changed her CODE STATUS to DNR 12/29/2020. Palliative care team following. Plan is to discharge to home parents home with hospice care.  Code Status: DNR.  Family Communication: None at bedside.  Disposition Plan: Likely will discharge to home with home with hospice care on 01/02/2021 once all DME's have been received to the home.   Consultants:  Neurology  Palliative care team.  Procedures:  None.  Antimicrobials:  Unasyn.  DVT prophylaxis: Subcu Lovenox daily.  Status is: Inpatient   Dispo: The patient is from: Home.               Anticipated d/c is to: Likely home with hospice care on 01/02/2021               Patient currently, not stable for discharge due to ongoing management of aspiration pneumonia, acute hypoxic respiratory failure, acute CVA, AKI.   Difficult to place patient, not applicable.        Objective: Vitals:   12/31/20 1900 12/31/20 2315 01/01/21 0300 01/01/21 0805  BP: (!) 143/81 (!) 146/81 (!) 168/83 (!) 179/90  Pulse: 92 71 77 69  Resp: 17 20 12 16   Temp: 99.1 F (37.3 C) 98.8 F (37.1 C) 98.8 F (37.1 C)   TempSrc: Oral Oral Oral Oral  SpO2: 97% 100% 100% 100%  Weight:      Height:        Intake/Output Summary (Last 24 hours) at 01/01/2021 1033 Last data filed at  01/01/2021 0700 Gross per 24 hour  Intake 600 ml  Output --  Net 600 ml   Filed Weights   12/28/20 1937  Weight: 72 kg    Exam:  . General: 55 y.o. year-old female chronically ill-appearing no acute distress.  She is alert and interactive.  Impaired speech noted.   . Cardiovascular: Regular rate and rhythm no rubs or gallops. Marland Kitchen Respiratory: Mild rales at bases no wheezing noted.  She has poor inspiratory effort.   . Abdomen: Soft nontender normal bowel sounds present. . Musculoskeletal:  Bilateral below the knee amputation.   . Skin: No ulcerative lesions noted. Marland Kitchen Psychiatry: Mood is appropriate for condition and setting.   Data Reviewed: CBC: Recent Labs  Lab 12/28/20 2011 12/29/20 0024 12/30/20 0036 12/31/20 0609 01/01/21 0550  WBC 14.4* 17.6* 15.5* 13.8* 10.1  NEUTROABS 12.0*  --   --   --   --   HGB 11.3* 10.5* 8.8* 7.9* 8.1*  HCT 35.7* 33.1* 28.0* 25.6* 26.4*  MCV 87.9 86.9 87.2 88.0 89.2  PLT 617* 574* 476* 455* 250*   Basic Metabolic Panel: Recent Labs  Lab 12/28/20 2011 12/29/20 0024 12/30/20 0036 12/31/20 0609 01/01/21 0550  NA 143 142 144 144 144  K 3.6 3.4* 3.0* 3.8 4.1  CL 103 102 105 106 107  CO2 29 25 31 29 28   GLUCOSE 208* 286* 76 132* 153*  BUN 23* 23* 21* 11 12  CREATININE 1.16* 1.19* 1.05* 1.16* 1.54*  CALCIUM 9.0 8.7* 8.7* 7.9* 7.9*  MG  --  1.4* 3.5*  --  1.9  PHOS  --   --  2.1* 3.3  --    GFR: Estimated Creatinine Clearance: 37.5 mL/min (A) (by C-G formula based on SCr of 1.54 mg/dL (H)). Liver Function Tests: Recent Labs  Lab 12/25/20 1055 12/28/20 2011  AST 11 21  ALT 8 16  ALKPHOS 93 81  BILITOT 0.5 1.0  PROT 6.9 7.2  ALBUMIN 3.1* 2.7*   Recent Labs  Lab 12/28/20 2011  LIPASE 31   No results for input(s): AMMONIA in the last 168 hours. Coagulation Profile: No results for input(s): INR, PROTIME in the last 168 hours. Cardiac Enzymes: No results for input(s): CKTOTAL, CKMB, CKMBINDEX, TROPONINI in the last 168 hours. BNP (last 3 results) No results for input(s): PROBNP in the last 8760 hours. HbA1C: No results for input(s): HGBA1C in the last 72 hours. CBG: Recent Labs  Lab 12/31/20 0819 12/31/20 1601 12/31/20 1937 01/01/21 0217 01/01/21 0806  GLUCAP 125* 135* 148* 135* 139*   Lipid Profile: No results for input(s): CHOL, HDL, LDLCALC, TRIG, CHOLHDL, LDLDIRECT in the last 72 hours. Thyroid Function Tests: No results for input(s): TSH, T4TOTAL, FREET4, T3FREE, THYROIDAB in the last 72  hours. Anemia Panel: Recent Labs    12/30/20 0036  VITAMINB12 348   Urine analysis:    Component Value Date/Time   COLORURINE YELLOW 07/28/2020 0300   APPEARANCEUR HAZY (A) 07/28/2020 0300   LABSPEC 1.020 07/28/2020 0300   PHURINE 7.0 07/28/2020 0300   GLUCOSEU >=500 (A) 07/28/2020 0300   GLUCOSEU NEGATIVE 05/21/2015 0831   HGBUR NEGATIVE 07/28/2020 0300   BILIRUBINUR NEGATIVE 07/28/2020 0300   KETONESUR NEGATIVE 07/28/2020 0300   PROTEINUR >=300 (A) 07/28/2020 0300   UROBILINOGEN 0.2 05/21/2015 0831   NITRITE NEGATIVE 07/28/2020 0300   LEUKOCYTESUR NEGATIVE 07/28/2020 0300   Sepsis Labs: @LABRCNTIP (procalcitonin:4,lacticidven:4)  ) Recent Results (from the past 240 hour(s))  Resp Panel by RT-PCR (Flu A&B, Covid) Nasopharyngeal  Swab     Status: None   Collection Time: 12/28/20  9:09 PM   Specimen: Nasopharyngeal Swab; Nasopharyngeal(NP) swabs in vial transport medium  Result Value Ref Range Status   SARS Coronavirus 2 by RT PCR NEGATIVE NEGATIVE Final    Comment: (NOTE) SARS-CoV-2 target nucleic acids are NOT DETECTED.  The SARS-CoV-2 RNA is generally detectable in upper respiratory specimens during the acute phase of infection. The lowest concentration of SARS-CoV-2 viral copies this assay can detect is 138 copies/mL. A negative result does not preclude SARS-Cov-2 infection and should not be used as the sole basis for treatment or other patient management decisions. A negative result may occur with  improper specimen collection/handling, submission of specimen other than nasopharyngeal swab, presence of viral mutation(s) within the areas targeted by this assay, and inadequate number of viral copies(<138 copies/mL). A negative result must be combined with clinical observations, patient history, and epidemiological information. The expected result is Negative.  Fact Sheet for Patients:  EntrepreneurPulse.com.au  Fact Sheet for Healthcare Providers:   IncredibleEmployment.be  This test is no t yet approved or cleared by the [place] FDA and  has been authorized for detection and/or diagnosis of SARS-CoV-2 by FDA under an Emergency Use Authorization (EUA). This EUA will remain  in effect (meaning this test can be used) for the duration of the COVID-19 declaration under Section 564(b)(1) of the Act, 21 U.S.C.section 360bbb-3(b)(1), unless the authorization is terminated  or revoked sooner.       Influenza A by PCR NEGATIVE NEGATIVE Final   Influenza B by PCR NEGATIVE NEGATIVE Final    Comment: (NOTE) The Xpert Xpress SARS-CoV-2/FLU/RSV plus assay is intended as an aid in the diagnosis of influenza from Nasopharyngeal swab specimens and should not be used as a sole basis for treatment. Nasal washings and aspirates are unacceptable for Xpert Xpress SARS-CoV-2/FLU/RSV testing.  Fact Sheet for Patients: EntrepreneurPulse.com.au  Fact Sheet for Healthcare Providers: IncredibleEmployment.be  This test is not yet approved or cleared by the [place] FDA and has been authorized for detection and/or diagnosis of SARS-CoV-2 by FDA under an Emergency Use Authorization (EUA). This EUA will remain in effect (meaning this test can be used) for the duration of the COVID-19 declaration under Section 564(b)(1) of the Act, 21 U.S.C. section 360bbb-3(b)(1), unless the authorization is terminated or revoked.  Performed at [facility], [place] [address] [place], [place] [address]   Blood culture (routine x 2)     Status: None (Preliminary result)   Collection Time: 12/28/20  9:09 PM   Specimen: BLOOD  Result Value Ref Range Status   Specimen Description BLOOD RIGHT ANTECUBITAL  Final   Special Requests   Final    BOTTLES DRAWN AEROBIC AND ANAEROBIC Blood Culture results may not be optimal due to an excessive volume of blood received in culture bottles   Culture   Final     NO GROWTH 4 DAYS Performed at [facility], [place] [address], [place], [place] [address]    Report Status PENDING  Incomplete  Blood culture (routine x 2)     Status: None (Preliminary result)   Collection Time: 12/28/20  9:09 PM   Specimen: BLOOD RIGHT FOREARM  Result Value Ref Range Status   Specimen Description BLOOD RIGHT FOREARM  Final   Special Requests   Final    BOTTLES DRAWN AEROBIC AND ANAEROBIC Blood Culture results may not be optimal due to an excessive volume of blood received in  culture bottles   Culture   Final    NO GROWTH 4 DAYS Performed at [facility], [place] [address] [place], [place] [address]    Report Status PENDING  Incomplete      Studies: No results found.  Scheduled Meds: . acetaminophen  650 mg Oral Once  . amLODipine  10 mg Oral Daily  . apixaban  10 mg Oral BID   Followed by  . [START ON 01/07/2021] apixaban  5 mg Oral BID  . atorvastatin  80 mg Oral Daily  . Chlorhexidine Gluconate Cloth  6 each Topical Daily  . cyanocobalamin  1,000 mcg Intramuscular Daily  . escitalopram  10 mg Oral QPM  . ferrous sulfate  325 mg Oral Q breakfast  . glycopyrrolate  0.2 mg Intravenous Q4H  . insulin aspart  0-15 Units Subcutaneous Q6H  . metoprolol tartrate  2.5 mg Intravenous Q8H  . pantoprazole (PROTONIX) IV  40 mg Intravenous Q24H  . thiamine  100 mg Oral Daily    Continuous Infusions: . ampicillin-sulbactam (UNASYN) IV 3 g (01/01/21 1030)  . dextrose 5% lactated ringers       LOS: 4 days     Kayleen Memos, MD [facility] Pager [phone]  If 7PM-7AM, please contact night-coverage [email or web address] Password [facility] 01/01/2021, 10:33 AM

## 2021-01-01 NOTE — Progress Notes (Signed)
STROKE TEAM PROGRESS NOTE   INTERVAL HISTORY Patient remains awake and alert sitting up  in be    Palliative care meeting yesterday made patient DNR/DNI with plans for home hospice soon but continue present measures of support.  Neurological exam is unchanged.  Vital signs are stable.  Vitals:   12/31/20 2315 01/01/21 0300 01/01/21 0805 01/01/21 1605  BP: (!) 146/81 (!) 168/83 (!) 179/90 (!) 194/94  Pulse: 71 77 69 100  Resp: 20 12 16 20   Temp: 98.8 F (37.1 C) 98.8 F (37.1 C)  98.8 F (37.1 C)  TempSrc: Oral Oral Oral Oral  SpO2: 100% 100% 100% 100%  Weight:      Height:       CBC:  Recent Labs  Lab 12/28/20 2011 12/29/20 0024 01/01/21 0550 01/01/21 1559  WBC 14.4*   < > 10.1 9.4  NEUTROABS 12.0*  --   --   --   HGB 11.3*   < > 8.1* 8.3*  HCT 35.7*   < > 26.4* 27.5*  MCV 87.9   < > 89.2 89.6  PLT 617*   < > 433* 469*   < > = values in this interval not displayed.   Basic Metabolic Panel:  Recent Labs  Lab 12/30/20 0036 12/31/20 0609 01/01/21 0550  NA 144 144 144  K 3.0* 3.8 4.1  CL 105 106 107  CO2 31 29 28   GLUCOSE 76 132* 153*  BUN 21* 11 12  CREATININE 1.05* 1.16* 1.54*  CALCIUM 8.7* 7.9* 7.9*  MG 3.5*  --  1.9  PHOS 2.1* 3.3  --    Lipid Panel:  No results for input(s): CHOL, TRIG, HDL, CHOLHDL, VLDL, LDLCALC in the last 168 hours. HgbA1c:  No results for input(s): HGBA1C in the last 168 hours. Urine Drug Screen: No results for input(s): LABOPIA, COCAINSCRNUR, LABBENZ, AMPHETMU, THCU, LABBARB in the last 168 hours.  Alcohol Level No results for input(s): ETH in the last 168 hours.  IMAGING past 24 hours No results found.   EEG Description: No clear posterior dominant rhythm was seen. EEG showed continuous generalized polymorphic sharply contoured 3 to 6 Hz theta-delta slowing. Hyperventilation and photic stimulation were not performed.     ABNORMALITY - Continuous slow, generalized  IMPRESSION: This study is suggestive of moderate diffuse  encephalopathy, nonspecific etiology but likely related to underlying stokes. No seizures or epileptiform discharges were seen throughout the recording.      PHYSICAL EXAM  Physical Exam  Constitutional: Pleasant middle-age Caucasian lady psych: Flat Affect Eyes: No scleral injection HENT: No OP obstrucion MSK: no joint deformities.  Cardiovascular: Normal rate and regular rhythm.  Respiratory: Effort normal, non-labored breathing GI: Soft.  No distension. There is no tenderness.  Skin: WDI Bilateral below-knee amputation.  Neuro: Mental Status: Patient is awake, alert, oriented to person.  Diminished attention, registration and recall Patient is not able to give a clear and coherent history. No signs of aphasia or neglect Cranial Nerves: II: Visual Fields are full. Pupils are not equal, left is 4 millimeters and nonreactive, right 3 millimeters reactive, round, III,IV, VI: EOM not intact. Eyes don't cross midline to right, without ptosis or diploplia.  V: Facial sensation is symmetric to temperature VII: Facial movement is symmetric.  VIII: hearing is intact to voice X: Uvula elevates symmetrically XI: Shoulder shrug is symmetric. XII: tongue is midline without atrophy or fasciculations.  Motor: Tone is normal. Bulk is normal. 4/5 strength was present in all four extremities  but right side slightly weaker than left. Has bilateral BKA Sensory: Sensation is symmetric to light touch and temperature in the arms and legs. Cerebellar: FNF unable to assess  ASSESSMENT/PLAN Ms. PERRIE RAGIN is a 55 y.o. female with history of significant vascular disease (bilateral BKA, coronary artery disease, multiple prior strokes), diabetes, hypertension, hyperlipidemia, obesity, hypothyroidism.  She is bedbound at baseline. She presented to the ED on 4/22 due to being slow to awaken in the morning, and then having cough and increased shortness of breath, severe aspiration event, emesis  (nonbloody, yellow fluid), requiring suctioning on EMS arrival with an O2 saturation of 85%.  She was febrile to 101.6 in the ED, with a leukocytosis to 14 and age indeterminant infarct of the left pons (new since November 2021) seen on head CT.  She was initially normotensive but then became quite hypertensive, with significant lability of her blood pressure  Acute Pons/Midbrain Stroke  left  infarct embolic secondary to small vessel disease source  CT head  A lacunar infarct within the left pons and pontomesencephalic junction is new from the prior head CT of 07/28/2020, but otherwise age indeterminate. Consider a brain MRI for further evaluation. Stable background generalized parenchymal atrophy and chronic ischemic disease with multiple chronic infarcts, as described.  CTA head  Known acute infarct in the left pons. Chronic small vessel ischemia with chronic lacunar infarcts in the deep gray nuclei and deep white matter. Chronic moderate size right occipital parietal cortex infarct. No hemorrhage or visible progression. No Hydrocephalus   CTA neck No emergent vascular finding. 2. Advanced intracranial atherosclerosis with high-grade right V4, right P1, right P2, and right MCA narrowings. Atherosclerotic disease has progressed from 2021. 3. Cervical carotid atherosclerosis with proximal ICA stenosis measuring 40% on the right and 35% on the left. 4. Right upper lobe pneumonia.  MRI   1. Moderate-sized acute infarct in the left pons and midbrain. 2. Additional small acute infarcts in the left greater than right cerebral hemispheres. 3. Advanced chronic ischemia with multiple chronic infarcts as above.  2D Echo ordered  LDL 196  HgbA1c 8.0  VTE prophylaxis - SCD    Diet   DIET - DYS 1 Room service appropriate? Yes; Fluid consistency: Nectar Thick    aspirin 81 mg daily prior to admission, now on aspirin 81 mg daily.   Therapy recommendations:  PT/OT  Disposition:   Pending therapy recs and family decisions for GOC  Hypertension  Home meds:  norvasc 10mg  daily, cozaar 50mg  daily   Stable . Permissive hypertension (OK if < 220/120) but gradually normalize in 5-7 days . Long-term BP goal normotensive  Hyperlipidemia  Home meds:  lipitor 80mg  , resumed in hospital  LDL 196, goal <70  Continue statin at discharge  Diabetes type II UnControlled  Home meds:  metformin  HgbA1c 8.0, goal < 7.0  CBGs Recent Labs    01/01/21 0217 01/01/21 0806 01/01/21 1608  GLUCAP 135* 139* 103*      SSI  Other Stroke Risk Factors  Cigarette smoker*advised to stop smoking  ETOH use, alcohol level <10, advised to drink no more than 1 drink(s) a day  Hx stroke/TIA  Coronary artery disease  Other Active Problems  Aspiration Pna- per primary team   Type 2 DM -SSI  Hospital day # 4 Patient presented with left pontine and midbrain as well as bilateral subcortical lacunar infarcts.  She has prior history of lacunar disease as well.  CT angiogram of the chest  also shows concurrent pulmonary emboli.  Family has decided on DNR/DNI and home hospice care.  Stroke team will sign off.  Kindly call for questions. Antony Contras MD  To contact Stroke Continuity provider, please refer to [email or web address] After hours, contact General Neurology

## 2021-01-02 ENCOUNTER — Other Ambulatory Visit (HOSPITAL_COMMUNITY): Payer: Self-pay

## 2021-01-02 DIAGNOSIS — I10 Essential (primary) hypertension: Secondary | ICD-10-CM

## 2021-01-02 LAB — BASIC METABOLIC PANEL
Anion gap: 10 (ref 5–15)
BUN: 10 mg/dL (ref 6–20)
CO2: 27 mmol/L (ref 22–32)
Calcium: 8.4 mg/dL — ABNORMAL LOW (ref 8.9–10.3)
Chloride: 107 mmol/L (ref 98–111)
Creatinine, Ser: 1.49 mg/dL — ABNORMAL HIGH (ref 0.44–1.00)
GFR, Estimated: 41 mL/min — ABNORMAL LOW (ref >60)
Glucose, Bld: 157 mg/dL — ABNORMAL HIGH (ref 70–99)
Potassium: 3.8 mmol/L (ref 3.5–5.1)
Sodium: 144 mmol/L (ref 135–145)

## 2021-01-02 LAB — CULTURE, BLOOD (ROUTINE X 2)
Culture: NO GROWTH
Culture: NO GROWTH

## 2021-01-02 LAB — GLUCOSE, CAPILLARY
Glucose-Capillary: 145 mg/dL — ABNORMAL HIGH (ref 70–99)
Glucose-Capillary: 135 mg/dL — ABNORMAL HIGH (ref 70–99)
Glucose-Capillary: 176 mg/dL — ABNORMAL HIGH (ref 70–99)
Glucose-Capillary: 107 mg/dL — ABNORMAL HIGH (ref 70–99)
Glucose-Capillary: 124 mg/dL — ABNORMAL HIGH (ref 70–99)

## 2021-01-02 LAB — CBC
HCT: 25.3 % — ABNORMAL LOW (ref 36.0–46.0)
Hemoglobin: 7.7 g/dL — ABNORMAL LOW (ref 12.0–15.0)
MCH: 27.2 pg (ref 26.0–34.0)
MCHC: 30.4 g/dL (ref 30.0–36.0)
MCV: 89.4 fL (ref 80.0–100.0)
Platelets: 438 10*3/uL — ABNORMAL HIGH (ref 150–400)
RBC: 2.83 MIL/uL — ABNORMAL LOW (ref 3.87–5.11)
RDW: 14.3 % (ref 11.5–15.5)
WBC: 8.7 10*3/uL (ref 4.0–10.5)
nRBC: 0 % (ref 0.0–0.2)

## 2021-01-02 MED ORDER — INSULIN ASPART 100 UNIT/ML ~~LOC~~ SOLN
0.0000 [IU] | SUBCUTANEOUS | Status: DC
  Administered 2021-01-02 – 2021-01-03 (×3): 2 [IU] via SUBCUTANEOUS

## 2021-01-02 MED ORDER — WHITE PETROLATUM EX OINT
TOPICAL_OINTMENT | CUTANEOUS | Status: AC
  Filled 2021-01-02: qty 28.35

## 2021-01-02 NOTE — Plan of Care (Signed)
  Problem: Education: Goal: Knowledge of General Education information will improve Description: Including pain rating scale, medication(s)/side effects and non-pharmacologic comfort measures Outcome: Progressing   Problem: Nutrition: Goal: Adequate nutrition will be maintained Outcome: Progressing   Problem: Coping: Goal: Level of anxiety will decrease Outcome: Progressing   Problem: Elimination: Goal: Will not experience complications related to bowel motility Outcome: Progressing   

## 2021-01-02 NOTE — Progress Notes (Signed)
[facility]  PROGRESS NOTE  [name] KNL:[id number] DOB: 07-Oct-1965 DOA: 12/28/2020 PCP: Mosie Lukes, MD   Brief HPI:   55 year old female with history of diabetes mellitus type 2, peripheral vascular disease, history of hemorrhagic and recurrent ischemic CVA, history of bilateral BKA, CAD, hypothyroidism was brought to the hospital for shortness of breath.  Per EMS her O2 sats was 85% on room air, work-up in the ED revealed sepsis due to aspiration pneumonia.  CT head showed lacunar infarct in the left pons and pontomesencephalic junction of unknown age.  MRI brain revealed moderate size acute infarct in the left pons and midbrain, additional small acute infarct in the left greater than right cerebral hemispheres.  Due to concern for possible endocarditis with possibility of hemorrhagic conversion initially neurologist Dr. Curly Shores recommended to hold off on anticoagulation.  On 12/31/2020, patient declined any invasive procedures, TEE and IVC filter placement were canceled.  Due to hypoxemia CTA PE was done on 12/30/2020 which showed acute right-sided PE.  She was started on Eliquis on 12/31/2020.  Palliative care was consulted and after discussion with patient's son plan was to go home with hospice.    Subjective   Patient seen and examined, denies shortness of breath.  Awaiting to go home with hospice.   Assessment/Plan:     1. Acute hypoxemic respiratory failure-secondary to aspiration pneumonia, significantly improved.  She was started on Unasyn for aspiration pneumonia.  Patient has penicillin allergy listed which caused GI upset with Augmentin.  After discussion with pharmacy she was started on Unasyn.  Chest x-ray showed no active disease.  CT chest on 12/29/2020 showed evidence of aspiration pneumonia.  Leukocytosis has resolved.  Procalcitonin is down to 3.40 from 12.16 on 12/29/2020.  Patient is currently going home with hospice.  She has completed 6 days of antibiotics in  the hospital.  We will continue antibiotics as long as patient is in the hospital.  Will not discharge on antibiotics. 2. Hypertension-blood pressure is elevated, gradually normalize blood pressure as patient recently had CVA.  Continue amlodipine 10 mg daily, scheduled Lopressor 2.5 IV every 8 hours. 3. Sepsis secondary to aspiration pneumonia-POA-patient presented with leukocytosis, tachycardia, tachypnea with evidence of aspiration pneumonia on CT chest.  She has been started on Unasyn.  We will continue Unasyn for 1 more day to complete 7 days of treatment.  Procalcitonin is down to 3.40 from 12.16. 4. Acute CVA-involving left pons and midbrain, additionally small infarcts in the left greater than right cerebral hemisphere.  She has history of intracranial hemorrhage in 2017 and recurrent ischemic strokes.  Due to concern for possible endocarditis and possibility of hemorrhagic conversion initially neurologist recommended to hold off on anticoagulation.  Patient's son declined any invasive procedure, TEE and IVC filter placement were canceled on 12/31/2020.  She was started on Eliquis  since 12/31/2020.  It has been switched to Lovenox twice a day since patient was not taking p.o. meds.  Will resume Eliquis at discharge. 5. New diagnosis of acute right-sided PE-bilateral lower extremity venous duplex was negative for DVT.  CT chest confirmed acute right-sided pulmonary embolism.  Patient was started on Eliquis.  Family has declined any invasive procedure including IVC filter placement.  We will continue with Eliquis at discharge. 6. Goals of care-palliative care was consulted and after discussion with patient's son on 12/30/2020.  Decision was made that patient will be made comfort care, will be DNR and going home with hospice.   Scheduled medications:   .  acetaminophen  650 mg Oral Once  . amLODipine  10 mg Oral Daily  . atorvastatin  80 mg Oral Daily  . Chlorhexidine Gluconate Cloth  6 each Topical  Daily  . cyanocobalamin  1,000 mcg Intramuscular Daily  . enoxaparin (LOVENOX) injection  70 mg Subcutaneous Q12H  . escitalopram  10 mg Oral QPM  . ferrous sulfate  325 mg Oral Q breakfast  . glycopyrrolate  0.2 mg Intravenous Q4H  . insulin aspart  0-15 Units Subcutaneous Q6H  . metoprolol tartrate  2.5 mg Intravenous Q8H  . pantoprazole (PROTONIX) IV  40 mg Intravenous Q24H  . thiamine  100 mg Oral Daily         Data Reviewed:   CBG:  Recent Labs  Lab 01/01/21 0806 01/01/21 1608 01/01/21 2122 01/02/21 0247 01/02/21 0809  GLUCAP 139* 103* 145* 135* 176*    SpO2: 99 % O2 Flow Rate (L/min): 4 L/min    Vitals:   01/02/21 0800 01/02/21 1040 01/02/21 1150 01/02/21 1156  BP: (!) 185/95 (!) 175/94    Pulse: 73 88  81  Resp: (!) 0 18  18  Temp:  98.6 F (37 C)    TempSrc:  Oral    SpO2: 98% 99% 98% 99%  Weight:      Height:         Intake/Output Summary (Last 24 hours) at 01/02/2021 1252 Last data filed at 01/02/2021 1200 Gross per 24 hour  Intake 378.59 ml  Output 1900 ml  Net -1521.41 ml    04/25 1901 - 04/27 0700 In: 600 [I.V.:600] Out: 1900 [Urine:1900]  Filed Weights   12/28/20 1937  Weight: 72 kg    CBC:  Recent Labs  Lab 12/28/20 2011 12/29/20 0024 12/30/20 0036 12/31/20 0609 01/01/21 0550 01/01/21 1559 01/02/21 0132  WBC 14.4*   < > 15.5* 13.8* 10.1 9.4 8.7  HGB 11.3*   < > 8.8* 7.9* 8.1* 8.3* 7.7*  HCT 35.7*   < > 28.0* 25.6* 26.4* 27.5* 25.3*  PLT 617*   < > 476* 455* 433* 469* 438*  MCV 87.9   < > 87.2 88.0 89.2 89.6 89.4  MCH 27.8   < > 27.4 27.1 27.4 27.0 27.2  MCHC 31.7   < > 31.4 30.9 30.7 30.2 30.4  RDW 14.6   < > 14.6 14.9 14.7 14.6 14.3  LYMPHSABS 1.5  --   --   --   --   --   --   MONOABS 0.8  --   --   --   --   --   --   EOSABS 0.1  --   --   --   --   --   --   BASOSABS 0.1  --   --   --   --   --   --    < > = values in this interval not displayed.    Complete metabolic panel:  Recent Labs  Lab 12/28/20 1955  12/28/20 2011 12/28/20 2011 12/29/20 0024 12/29/20 1543 12/30/20 0036 12/31/20 0609 01/01/21 0550 01/02/21 0132  NA  --  143   < > 142  --  144 144 144 144  K  --  3.6   < > 3.4*  --  3.0* 3.8 4.1 3.8  CL  --  103   < > 102  --  105 106 107 107  CO2  --  29   < > 25  --  31  29 28 27   GLUCOSE  --  208*   < > 286*  --  76 132* 153* 157*  BUN  --  23*   < > 23*  --  21* 11 12 10   CREATININE  --  1.16*   < > 1.19*  --  1.05* 1.16* 1.54* 1.49*  CALCIUM  --  9.0   < > 8.7*  --  8.7* 7.9* 7.9* 8.4*  AST  --  21  --   --   --   --   --   --   --   ALT  --  16  --   --   --   --   --   --   --   ALKPHOS  --  81  --   --   --   --   --   --   --   BILITOT  --  1.0  --   --   --   --   --   --   --   ALBUMIN  --  2.7*  --   --   --   --   --   --   --   MG  --   --   --  1.4*  --  3.5*  --  1.9  --   DDIMER  --   --   --   --  3.09*  --   --   --   --   PROCALCITON  --   --   --   --  12.16  --  6.29 3.40  --   LATICACIDVEN 1.6  --   --  1.5  --   --   --   --   --    < > = values in this interval not displayed.    Recent Labs  Lab 12/28/20 2011  LIPASE 31    Recent Labs  Lab 12/28/20 2109 12/29/20 1543 12/31/20 0609 01/01/21 0550  DDIMER  --  3.09*  --   --   PROCALCITON  --  12.16 6.29 3.40  SARSCOV2NAA NEGATIVE  --   --   --     ------------------------------------------------------------------------------------------------------------------ No results for input(s): CHOL, HDL, LDLCALC, TRIG, CHOLHDL, LDLDIRECT in the last 72 hours.  Lab Results  Component Value Date   HGBA1C 8.0 (H) 12/25/2020    Cardiac Enzymes  No results for input(s): CKMB, TROPONINI, MYOGLOBIN in the last 168 hours.  Invalid input(s): CK ------------------------------------------------------------------------------------------------------------------    Component Value Date/Time   BNP 335.2 (H) 06/23/2018 2116     Antibiotics: Anti-infectives (From admission, onward)   Start      Dose/Rate Route Frequency Ordered Stop   12/29/20 0200  Ampicillin-Sulbactam (UNASYN) 3 g in sodium chloride 0.9 % 100 mL IVPB        3 g 200 mL/hr over 30 Minutes Intravenous Every 8 hours 12/29/20 0010     12/28/20 2000  cefTRIAXone (ROCEPHIN) 1 g in sodium chloride 0.9 % 100 mL IVPB        1 g 200 mL/hr over 30 Minutes Intravenous  Once 12/28/20 1955 12/29/20 0156   12/28/20 2000  metroNIDAZOLE (FLAGYL) IVPB 500 mg        500 mg 100 mL/hr over 60 Minutes Intravenous  Once 12/28/20 1955 12/28/20 2225       Radiology Reports  No results found.    DVT prophylaxis: Lovenox  Code Status: DNR  Family  Communication: No family at bedside   Consultants:    Procedures:      Objective    Physical Examination:   General-appears in no acute distress Heart-S1-S2, regular, no murmur auscultated Lungs-clear to auscultation bilaterally, no wheezing or crackles auscultated Abdomen-soft, nontender, no organomegaly Extremities-s/p bilateral BKA Neuro-alert, oriented x3, no focal deficit noted  Status is: Inpatient  Dispo: The patient is from: Home              Anticipated d/c is to: Home with hospice              Anticipated d/c date is: 01/03/2021              Patient currently stable for discharge  Barrier to discharge-awaiting arrangement for home with hospice  COVID-19 Labs  No results for input(s): DDIMER, FERRITIN, LDH, CRP in the last 72 hours.  Lab Results  Component Value Date   [place] NEGATIVE 12/28/2020   [place] NEGATIVE 07/28/2020   [place] NEGATIVE 11/16/2019   [place] NEGATIVE 09/20/2019    Microbiology  Recent Results (from the past 240 hour(s))  Resp Panel by RT-PCR (Flu A&B, Covid) Nasopharyngeal Swab     Status: None   Collection Time: 12/28/20  9:09 PM   Specimen: Nasopharyngeal Swab; Nasopharyngeal(NP) swabs in vial transport medium  Result Value Ref Range Status   SARS Coronavirus 2 by RT PCR NEGATIVE NEGATIVE Final     Comment: (NOTE) SARS-CoV-2 target nucleic acids are NOT DETECTED.  The SARS-CoV-2 RNA is generally detectable in upper respiratory specimens during the acute phase of infection. The lowest concentration of SARS-CoV-2 viral copies this assay can detect is 138 copies/mL. A negative result does not preclude SARS-Cov-2 infection and should not be used as the sole basis for treatment or other patient management decisions. A negative result may occur with  improper specimen collection/handling, submission of specimen other than nasopharyngeal swab, presence of viral mutation(s) within the areas targeted by this assay, and inadequate number of viral copies(<138 copies/mL). A negative result must be combined with clinical observations, patient history, and epidemiological information. The expected result is Negative.  Fact Sheet for Patients:  EntrepreneurPulse.com.au  Fact Sheet for Healthcare Providers:  IncredibleEmployment.be  This test is no t yet approved or cleared by the [place] FDA and  has been authorized for detection and/or diagnosis of SARS-CoV-2 by FDA under an Emergency Use Authorization (EUA). This EUA will remain  in effect (meaning this test can be used) for the duration of the COVID-19 declaration under Section 564(b)(1) of the Act, 21 U.S.C.section 360bbb-3(b)(1), unless the authorization is terminated  or revoked sooner.       Influenza A by PCR NEGATIVE NEGATIVE Final   Influenza B by PCR NEGATIVE NEGATIVE Final    Comment: (NOTE) The Xpert Xpress SARS-CoV-2/FLU/RSV plus assay is intended as an aid in the diagnosis of influenza from Nasopharyngeal swab specimens and should not be used as a sole basis for treatment. Nasal washings and aspirates are unacceptable for Xpert Xpress SARS-CoV-2/FLU/RSV testing.  Fact Sheet for Patients: EntrepreneurPulse.com.au  Fact Sheet for Healthcare  Providers: IncredibleEmployment.be  This test is not yet approved or cleared by the [place] FDA and has been authorized for detection and/or diagnosis of SARS-CoV-2 by FDA under an Emergency Use Authorization (EUA). This EUA will remain in effect (meaning this test can be used) for the duration of the COVID-19 declaration under Section 564(b)(1) of the Act, 21 U.S.C. section 360bbb-3(b)(1), unless the authorization is terminated  or revoked.  Performed at [facility], [place] [address], [place], [place] [address]   Blood culture (routine x 2)     Status: None   Collection Time: 12/28/20  9:09 PM   Specimen: BLOOD  Result Value Ref Range Status   Specimen Description BLOOD RIGHT ANTECUBITAL  Final   Special Requests   Final    BOTTLES DRAWN AEROBIC AND ANAEROBIC Blood Culture results may not be optimal due to an excessive volume of blood received in culture bottles   Culture   Final    NO GROWTH 5 DAYS Performed at [facility], [place] [address] [place], [place] [address]    Report Status 01/02/2021 FINAL  Final  Blood culture (routine x 2)     Status: None   Collection Time: 12/28/20  9:09 PM   Specimen: BLOOD RIGHT FOREARM  Result Value Ref Range Status   Specimen Description BLOOD RIGHT FOREARM  Final   Special Requests   Final    BOTTLES DRAWN AEROBIC AND ANAEROBIC Blood Culture results may not be optimal due to an excessive volume of blood received in culture bottles   Culture   Final    NO GROWTH 5 DAYS Performed at [facility], [place] [address] [place], [place] [address]    Report Status 01/02/2021 FINAL  Final    Pressure Injury 01/05/19 Deep Tissue Injury - Purple or maroon localized area of discolored intact skin or blood-filled blister due to damage of underlying soft tissue from pressure and/or shear. (Active)  01/05/19 1758  Location: Heel  Location Orientation: Left  Staging: Deep Tissue Injury - Purple or maroon localized  area of discolored intact skin or blood-filled blister due to damage of underlying soft tissue from pressure and/or shear.  Wound Description (Comments):   Present on Admission: Yes     Pressure Injury 12/28/20 Buttocks Right;Left;Medial Stage 2 -  Partial thickness loss of dermis presenting as a shallow open injury with a red, pink wound bed without slough. (Active)  12/28/20 2330  Location: Buttocks  Location Orientation: Right;Left;Medial  Staging: Stage 2 -  Partial thickness loss of dermis presenting as a shallow open injury with a red, pink wound bed without slough.  Wound Description (Comments):   Present on Admission: Yes          [place]   [facility] If 7PM-7AM, please contact night-coverage at [email or web address], Office  [phone]   01/02/2021, 12:52 PM  LOS: 5 days

## 2021-01-02 NOTE — Progress Notes (Addendum)
Occupational Therapy Treatment Patient Details Name: [name] MRN: [id number] DOB: May 14, 1966 Today's Date: 01/02/2021    History of present illness 55 y.o. female admitted 4/22 after being slow to awaken and then gagging at home. Pt with multifocal PNA, PE and acute infarcts. PMhx: DMT2 with vascular disease, HTN, CVA, bilateral BKA, ICH in 2017, CAD, hypothyroidism who lives with her son who is also her caregiver. She is bedbound at baseline   OT comments  Patient with recent palliative assessment, and goals of care shifted to home with hospice care.  OT able to work on light bed mobility and repositioning from R side lying to left side lying with pillows for positioning.  She did initiate and participate with light grooming, needing up to Mod A.  Attempted sitting edge of bed, but the patient is very fearful with any sudden and big changes in position.  OT to maintain 1x/wk, but anticipate home with hospice.  Session cleared with nursing, OT able to assist patient with rolling, so RN could assess sacral wound.    Follow Up Recommendations  Supervision/Assistance - 24 hour    Equipment Recommendations  Hospital bed    Recommendations for Other Services      Precautions / Restrictions Precautions Precautions: Fall Restrictions Weight Bearing Restrictions: No Other Position/Activity Restrictions: pre-morbid B BKA, sacral wound, and bruise and scab to lateral aspect of R knee.       Mobility Bed Mobility Overal bed mobility: Needs Assistance Bed Mobility: Rolling Rolling: Max assist         General bed mobility comments: patient able to reach for SR, but cannot pull herself over.  Very fearful with sudden movements. Patient Response: Flat affect  Transfers                                                                 ADL either performed or assessed with clinical judgement   ADL Overall ADL's : Needs assistance/impaired      Grooming: Wash/dry hands;Wash/dry face;Moderate assistance;Bed level Grooming Details (indicate cue type and reason): Able to initiate, but assist for thoroughness                               General ADL Comments: remains bed level dependent for majority of ADL.                       Cognition Arousal/Alertness: Awake/alert Behavior During Therapy: Flat affect Overall Cognitive Status: History of cognitive impairments - at baseline                                 General Comments: previous CVA deficits, requiring total assist overall at home from son per documentation                          Pertinent Vitals/ Pain       Pain Assessment: Faces Faces Pain Scale: Hurts a little bit Pain Location: generalized with rolling Pain Descriptors / Indicators: Grimacing Pain Intervention(s): Monitored during session  Frequency  Min 1X/week        Progress Toward Goals  OT Goals(current goals can now be found in the care plan section)  Progress towards OT goals: Not progressing toward goals - comment (patient transitioning to hospice.)  Acute Rehab OT Goals Patient Stated Goal: none stated OT Goal Formulation: With patient Time For Goal Achievement: 01/13/21 Potential to Achieve Goals: Poor  Plan Discharge plan remains appropriate    Co-evaluation                 AM-PAC OT "6 Clicks" Daily Activity     Outcome Measure   Help from another person eating meals?: Total Help from another person taking care of personal grooming?: A Lot Help from another person toileting, which includes using toliet, bedpan, or urinal?: Total Help from another person bathing (including washing, rinsing, drying)?: Total Help from another person to put on and taking off regular upper body clothing?: Total Help from another person to put on and taking off regular  lower body clothing?: Total 6 Click Score: 7    End of Session    OT Visit Diagnosis: Muscle weakness (generalized) (M62.81);Feeding difficulties (R63.3);Other symptoms and signs involving cognitive function;Hemiplegia and hemiparesis Hemiplegia - Right/Left: Right Hemiplegia - dominant/non-dominant: Dominant   Activity Tolerance Patient tolerated treatment well   Patient Left in bed;with call bell/phone within reach;with bed alarm set   Nurse Communication Mobility status;Other (comment) (rolled to L side and positioned with pillows.)        Time: 1123-1140 OT Time Calculation (min): 17 min  Charges: OT General Charges $OT Visit: 1 Visit OT Treatments $Self Care/Home Management : 8-22 mins  01/02/2021  Rich, OTR/L  Acute Rehabilitation Services  Office:  [phone]    [name] 01/02/2021, 11:53 AM

## 2021-01-02 NOTE — TOC Progression Note (Signed)
Transition of Care (TOC) - Progression Note  Marvetta Gibbons RN, BSN Transitions of Care Unit 4E- RN Case Manager See Treatment Team for direct phone #     Patient Details  Name: [name] MRN: [id number] Date of Birth: January 05, 1966  Transition of Care [facility]) CM/SW Contact  Dahlia Client, Romeo Rabon, RN Phone Number: 01/02/2021, 4:15 PM  Clinical Narrative:    [facility] following for home Hospice needs, spoke with the son and confirmed that family has been able to move bed and have things arranged for pt to go to parents home,  Address confirmed with son- [address] [place], this is her parents home that she is going to at discharge with home hospice services.   Son is requesting that meds be sent to [place]- he will plan to pick them up on his way to the home when EMS is on their way.   62- Medi has spoken with son and confirmed Hospice services. They will plan to admit pt into Hospice services tomorrow when she arrives to the parents home. DME has been ordered by [facility] to include w/c and home 02.   Per son will plan to transport via EMS, will need GOLD DNR for transport, MD has been updated on arrangements.   Cory's best contact- [phone] Home phone at parents home- [phone] Alternate #- [phone]     Expected Discharge Plan: Home w Hospice Care Barriers to Discharge: Barriers Resolved  Expected Discharge Plan and Services Expected Discharge Plan: [place]   Discharge Planning Services: CM Consult Post Acute Care Choice: Hospice,Durable Medical Equipment Living arrangements for the past 2 months: Single Family Home                 DME Arranged: [place] Rile rolling,Oxygen DME Agency: Other - Comment     Representative spoke with at DME Agency: per Hospice HH Arranged: Disease Management [place] Agency: [place] Date [facility] Agency Contacted: 01/01/21 Time [place]: [phone] Representative spoke with at [place]: [place] ([place]) Interventions    Readmission Risk Interventions Readmission Risk Prevention Plan 01/02/2021 12/14/2018 07/06/2018  Transportation Screening Complete Complete Complete  PCP or Specialist Appt within 5-7 Days Complete - Complete  PCP or Specialist Appt within 3-5 Days - Complete -  Home Care Screening Complete - Complete  Medication Review (RN CM) Complete - Complete  HRI or [place] - Complete -  Social Work Consult for [place] Planning/Counseling - Complete -  Palliative Care Screening - Not Applicable -  Some recent data might be hidden

## 2021-01-03 ENCOUNTER — Other Ambulatory Visit (HOSPITAL_COMMUNITY): Payer: Self-pay

## 2021-01-03 DIAGNOSIS — Z89512 Acquired absence of left leg below knee: Secondary | ICD-10-CM

## 2021-01-03 DIAGNOSIS — Z89511 Acquired absence of right leg below knee: Secondary | ICD-10-CM

## 2021-01-03 LAB — GLUCOSE, CAPILLARY
Glucose-Capillary: 106 mg/dL — ABNORMAL HIGH (ref 70–99)
Glucose-Capillary: 136 mg/dL — ABNORMAL HIGH (ref 70–99)
Glucose-Capillary: 136 mg/dL — ABNORMAL HIGH (ref 70–99)
Glucose-Capillary: 74 mg/dL (ref 70–99)
Glucose-Capillary: 88 mg/dL (ref 70–99)
Glucose-Capillary: 93 mg/dL (ref 70–99)

## 2021-01-03 MED ORDER — ENOXAPARIN SODIUM 80 MG/0.8ML IJ SOSY: 70.0000 mg | PREFILLED_SYRINGE | Freq: Two times a day (BID) | INTRAMUSCULAR | 0 refills | Status: AC

## 2021-01-03 NOTE — Progress Notes (Signed)
Spoke with son, Ernestine Mcmurray, via telephone ref request relayed to this Hydrographic surveyor by Scientist, research (physical sciences). The operator reported that the patient's son was unhappy with discharge process and wanted to speak to someone above the 4E Agricultural consultant.  Son explained that he and his parents had awaited the patient's arrival most of the day with repeated revisions of expected departure time via PTAR, and that his last contact with the care team on 4E left him with the impression that the patient would not be transported tonight. Then he was contacted around 2200 telling him that the patient had left and was in transit to their home. He explained to me that this was unacceptable because the patient requires care which the family is to receive training via home health, and they will not be available again until tomorrow. Son was also concerned that patient would not be in a safe place where she would receive adequate care for the remainder of the night.  At the time of my conversation with the son, I had already reversed the patient's prior filed discharge and placed the patient back into a room on 4E. The patient had not left the property and had been waiting with PTAR in the ED. So I reassured the son that his mother was safe and back in a room where the staff know her and are able to provide adequate care.   I reviewed the PTAR process with the son and illustrated how unpredictable that can be with regard to [name] being able to determine time of departure. He had previously said that home health was planning to visit them tomorrow since the initial visit didn't work out, so I aimed to establish a timeline and coordination of transport may occur.   The son then stated that he has the capability to transport his mother home and that he'll plan to do that tomorrow and coordinate with home health himself to ensure a smooth transition. I affirmed to the son that this plan would be optimal for this situation if he has the  resources to execute it safely.   The son stated that I had addressed his concerns at this time and he was okay with the plan going forward.   Afterward, I updated Tim, CN on 4E on this plan and my conversation.

## 2021-01-03 NOTE — TOC Progression Note (Signed)
Transition of Care [facility]) - Progression Note    Patient Details  Name: [name] MRN: [id number] Date of Birth: January 05, 1966  Transition of Care [facility]) CM/SW Contact  Graves-Bigelow, Ocie Cornfield, RN Phone Number: 01/03/2021, 11:11 AM  Clinical Narrative: Case Manager reached out to the son [name] regarding a good time for PTAR to transport for transition home. Son stated that the hospital bed and the oxygen has arrived to the home. Son is awaiting for the suction. Case Manager called Hoyle Sauer with [facility] to make them aware of suction. Suction will be ordered and delivered to the home. Once suction has been delivered the patient will be ready to transition home via [place]. Son states he will be ok if the wheelchair has not arrived yet. Case Manager will continue to follow for additional transition of care needs.   Expected Discharge Plan: Home w Hospice Care Barriers to Discharge: Barriers Resolved  Expected Discharge Plan and Services Expected Discharge Plan: [place]   Discharge Planning Services: CM Consult Post Acute Care Choice: Hospice,Durable Medical Equipment Living arrangements for the past 2 months: Single Family Home                 DME Arranged: [place]ford Rile rolling,Oxygen DME Agency: Other - Comment     Representative spoke with at DME Agency: per Hospice HH Arranged: Disease Management [place] Agency: [place] Date [place]: 01/01/21 Time [place]: [place] Representative spoke with at [place]: [place]  Readmission Risk Interventions Readmission Risk Prevention Plan 01/02/2021 12/14/2018 07/06/2018  Transportation Screening Complete Complete Complete  PCP or Specialist Appt within 5-7 Days Complete - Complete  PCP or Specialist Appt within 3-5 Days - Complete -  Home Care Screening Complete - Complete  Medication Review (RN CM) Complete - Complete  HRI or [place] - Complete -  Social Work Consult for  [place] Planning/Counseling - Complete -  Palliative Care Screening - Not Applicable -  Some recent data might be hidden

## 2021-01-03 NOTE — Progress Notes (Signed)
Patient discharged  Via PTAR, suction canister and discharge packet sent with patient.

## 2021-01-03 NOTE — Plan of Care (Signed)
  Problem: Education: Goal: Knowledge of General Education information will improve Description: Including pain rating scale, medication(s)/side effects and non-pharmacologic comfort measures Outcome: Adequate for Discharge   Problem: Health Behavior/Discharge Planning: Goal: Ability to manage health-related needs will improve Outcome: Adequate for Discharge   Problem: Clinical Measurements: Goal: Ability to maintain clinical measurements within normal limits will improve Outcome: Adequate for Discharge Goal: Will remain free from infection Outcome: Adequate for Discharge Goal: Diagnostic test results will improve Outcome: Adequate for Discharge Goal: Respiratory complications will improve Outcome: Adequate for Discharge Goal: Cardiovascular complication will be avoided Outcome: Adequate for Discharge   Problem: Activity: Goal: Risk for activity intolerance will decrease 01/03/2021 1427 by Caroll Rancher, RN Outcome: Adequate for Discharge 01/03/2021 1151 by Caroll Rancher, RN Outcome: Progressing   Problem: Nutrition: Goal: Adequate nutrition will be maintained 01/03/2021 1427 by Caroll Rancher, RN Outcome: Adequate for Discharge 01/03/2021 1151 by Caroll Rancher, RN Outcome: Progressing   Problem: Coping: Goal: Level of anxiety will decrease 01/03/2021 1427 by Caroll Rancher, RN Outcome: Adequate for Discharge 01/03/2021 1151 by Caroll Rancher, RN Outcome: Progressing

## 2021-01-03 NOTE — Discharge Summary (Signed)
Physician Discharge Summary  [name] UXL:[id number] DOB: [birth year] DOA: 12/28/2020  PCP: Mosie Lukes, MD  Admit date: 12/28/2020 Discharge date: 01/03/2021  Time spent: 50* minutes  Recommendations for Outpatient Follow-up:  1. Patient with discharge with home hospice   Discharge Diagnoses:  Principal Problem:   Aspiration pneumonia ([place]) Active Problems:   Essential hypertension, benign   Goals of care, counseling/discussion   Anemia of chronic disease   Type 2 diabetes mellitus with vascular disease ([place])   S/P bilateral BKA (below knee amputation) ([place])   Lacunar infarction ([place])   Thrombocytosis   Respiratory distress   Pressure injury of skin   Pneumonia due to infectious organism   Palliative care by specialist   Discharge Condition: Stable  Diet recommendation: Comfort diet  Filed Weights   12/28/20 1937  Weight: 72 kg    History of present illness:  55 year old female with history of diabetes mellitus type 2, peripheral vascular disease, history of hemorrhagic and recurrent ischemic CVA, history of bilateral BKA, CAD, hypothyroidism was brought to the hospital for shortness of breath.  Per EMS her O2 sats was 85% on room air, work-up in the ED revealed sepsis due to aspiration pneumonia.  CT head showed lacunar infarct in the left pons and pontomesencephalic junction of unknown age.  MRI brain revealed moderate size acute infarct in the left pons and midbrain, additional small acute infarct in the left greater than right cerebral hemispheres.  Due to concern for possible endocarditis with possibility of hemorrhagic conversion initially neurologist Dr. Curly Shores recommended to hold off on anticoagulation.  On 12/31/2020, patient declined any invasive procedures, TEE and IVC filter placement were canceled.  Due to hypoxemia CTA PE was done on 12/30/2020 which showed acute right-sided PE.  She was started on Eliquis on 12/31/2020.  Palliative care was consulted and  after discussion with patient's son plan was to go home with hospice  Hospital Course:   1. Acute hypoxemic respiratory failure-secondary to aspiration pneumonia, significantly improved.  She was started on Unasyn for aspiration pneumonia.  Patient has penicillin allergy listed which caused GI upset with Augmentin.  After discussion with pharmacy she was started on Unasyn.  Chest x-ray showed no active disease.  CT chest on 12/29/2020 showed evidence of aspiration pneumonia.  Leukocytosis has resolved.  Procalcitonin is down to 3.40 from 12.16 on 12/29/2020.  Patient is currently going home with hospice.  She has completed 7 days of antibiotics in the hospital  2. Hypertension-blood pressure is elevated, gradually normalize blood pressure as patient recently had CVA.  Continue amlodipine 10 mg daily  3. Sepsis secondary to aspiration pneumonia-POA-patient presented with leukocytosis, tachycardia, tachypnea with evidence of aspiration pneumonia on CT chest.  She has been started on Unasyn.  Patient was given 7 days of treatment with Unasyn in the hospital.  Procalcitonin is down to 3.40 from 12.16.  4. Acute CVA-involving left pons and midbrain, additionally small infarcts in the left greater than right cerebral hemisphere.  She has history of intracranial hemorrhage in 2017 and recurrent ischemic strokes.  Due to concern for possible endocarditis and possibility of hemorrhagic conversion initially neurologist recommended to hold off on anticoagulation.  Patient's son declined any invasive procedure, TEE and IVC filter placement were canceled on 12/31/2020.  She was started on Eliquis  since 12/31/2020.  It has been switched to Lovenox twice a day since patient was not taking p.o. meds.  I will discharge her on Lovenox for now.  Hospice physician  to decide whether to continue Lovenox or not.  5. New diagnosis of acute right-sided PE-bilateral lower extremity venous duplex was negative for DVT.  CT chest  confirmed acute right-sided pulmonary embolism.  .  Family has declined any invasive procedure including IVC filter placement.  We will continue with Lovenox full dose as above.  Hospice physician to decide whether to continue this medication or not.  6. Goals of care-palliative care was consulted and after discussion with patient's son on 12/30/2020.  Decision was made that patient will be made comfort care, will be DNR and going home with hospice.   Procedures:    Consultations:    Discharge Exam: Vitals:   01/03/21 1200 01/03/21 1400  BP: (!) 139/92   Pulse:  84  Resp: 16 17  Temp: 97.9 F (36.6 C)   SpO2: 99% 100%    General: Appears in no acute distress Cardiovascular: S1-S2, regular Respiratory: Clear to auscultation bilaterally  Discharge Instructions   Discharge Instructions    Diet - low sodium heart healthy   Complete by: As directed    Increase activity slowly   Complete by: As directed    No wound care   Complete by: As directed      Allergies as of 01/03/2021      Reactions   Lyrica [pregabalin] Other (See Comments)   MADE PATIENT VERY EMOTIONAL AND WOULD CRY EASILY   Morphine And Related Nausea And Vomiting   Penicillins Other (See Comments)   GI upset related to Augmentin use Did it involve swelling of the face/tongue/throat, SOB, or low BP? No Did it involve sudden or severe rash/hives, skin peeling, or any reaction on the inside of your mouth or nose? No Did you need to seek medical attention at a hospital or doctor's office? No When did it last happen?April 2020 If all above answers are "NO", may proceed with cephalosporin use.   Tramadol Nausea And Vomiting   Pt cant tolerate this pain med.       Medication List    TAKE these medications   amLODipine 10 MG tablet Commonly known as: NORVASC TAKE 1 TABLET BY MOUTH EVERY DAY What changed: when to take this   atorvastatin 80 MG tablet Commonly known as: LIPITOR Take 1 tablet (80 mg  total) by mouth daily at 6 PM.   blood glucose meter kit and supplies Dispense based on patient and insurance preference. Use up to four times daily as directed. DX E11.59   enoxaparin 80 MG/0.8ML injection Commonly known as: LOVENOX Inject 0.7 mLs (70 mg total) into the skin every 12 (twelve) hours. Starting Wed 01/02/2021 until Tue 04/02/2021.   escitalopram 10 MG tablet Commonly known as: LEXAPRO TAKE 1 TABLET BY MOUTH EVERY DAY What changed: when to take this   ferrous sulfate 325 (65 FE) MG EC tablet TAKE 1 TABLET BY MOUTH EVERY DAY WITH BREAKFAST What changed: See the new instructions.   levothyroxine 25 MCG tablet Commonly known as: SYNTHROID TAKE 1 TABLET (25 MCG TOTAL) BY MOUTH DAILY BEFORE BREAKFAST.   losartan 50 MG tablet Commonly known as: COZAAR Take 1 tablet (50 mg total) by mouth daily. What changed: when to take this   metFORMIN 1000 MG tablet Commonly known as: GLUCOPHAGE Take 1 tablet (1,000 mg total) by mouth 2 (two) times daily with a meal.   metoprolol tartrate 25 MG tablet Commonly known as: LOPRESSOR Take 25 mg by mouth every evening.   ondansetron 4 MG tablet Commonly known  as: Zofran Take 1 tablet (4 mg total) by mouth every 8 (eight) hours as needed for nausea or vomiting.   pantoprazole 40 MG tablet Commonly known as: PROTONIX Take 1 tablet (40 mg total) by mouth daily.   polyethylene glycol 17 g packet Commonly known as: MIRALAX / GLYCOLAX Take 17 g by mouth daily. What changed:   when to take this  reasons to take this            Durable Medical Equipment  (From admission, onward)         Start     Ordered   01/01/21 1456  For home use only DME oxygen  Once       Question Answer Comment  Length of Need 6 Months   Mode or (Route) Nasal cannula   Liters per Minute 3   Frequency Continuous (stationary and portable oxygen unit needed)   Oxygen conserving device Yes   Oxygen delivery system Gas      01/01/21 1455          Allergies  Allergen Reactions  . Lyrica [Pregabalin] Other (See Comments)    MADE PATIENT VERY EMOTIONAL AND WOULD CRY EASILY  . Morphine And Related Nausea And Vomiting  . Penicillins Other (See Comments)    GI upset related to Augmentin use  Did it involve swelling of the face/tongue/throat, SOB, or low BP? No Did it involve sudden or severe rash/hives, skin peeling, or any reaction on the inside of your mouth or nose? No Did you need to seek medical attention at a hospital or doctor's office? No When did it last happen?April 2020 If all above answers are "NO", may proceed with cephalosporin use.   . Tramadol Nausea And Vomiting    Pt cant tolerate this pain med.     Follow-up [place] Follow up.   Why: [facility] services arranged- they will arrange DME for  the home and will admit into hospice on return home Contact information: [address] [address] [phone]                The results of significant diagnostics from this hospitalization (including imaging, microbiology, ancillary and laboratory) are listed below for reference.    Significant Diagnostic Studies: CT ANGIO HEAD NECK W WO CM  Result Date: 12/30/2020 CLINICAL DATA:  Evaluate dysphagia EXAM: CT ANGIOGRAPHY HEAD AND NECK TECHNIQUE: Multidetector CT imaging of the head and neck was performed using the standard protocol during bolus administration of intravenous contrast. Multiplanar CT image reconstructions and MIPs were obtained to evaluate the vascular anatomy. Carotid stenosis measurements (when applicable) are obtained utilizing NASCET criteria, using the distal internal carotid diameter as the denominator. CONTRAST:  144mL OMNIPAQUE IOHEXOL 350 MG/ML SOLN COMPARISON:  Head CT from 2 days ago FINDINGS: CT HEAD FINDINGS Brain: Known acute infarct in the left pons. Chronic small vessel ischemia with chronic lacunar infarcts in the deep  gray nuclei and deep white matter. Chronic moderate size right occipital parietal cortex infarct. No hemorrhage or visible progression. No hydrocephalus Vascular: See below Skull: Normal. Negative for fracture or focal lesion. Sinuses: Imaged portions are clear. Orbits: Left cataract resection Review of the MIP images confirms the above findings CTA NECK FINDINGS Aortic arch: Atheromatous plaque.  Partially covered CABG changes. Right carotid system: Low-density atheromatous wall thickening of the common carotid with mixed density plaque at the bifurcation causing 40% ICA stenosis. No ulceration or  dissection. Left carotid system: Low-density atheromatous wall thickening of the common carotid with accentuated plaque at the bifurcation. Left ICA origin stenosis measures 35% on sagittal reformats. No dissection or ulceration. Vertebral arteries: Proximal subclavian atherosclerosis without flow limiting stenosis. Atheromatous plaque at the right vertebral origin and V1 segment without flow reducing stenosis based on coronal reformats. No vertebral dissection or beading. Skeleton: No acute finding Other neck: No evidence of mass or inflammation. Upper chest: Airspace disease in the right upper lobe. Review of the MIP images confirms the above findings CTA HEAD FINDINGS Anterior circulation: Heavily calcified carotid siphons. No stenosis of 50% or greater seen when allowing for artifact from calcified plaque blooming. Advanced right MCA bifurcation stenosis with some degree of underfilling of downstream branches. Better patency of left MCA branches, although there is still atheromatous changes Posterior circulation: Atheromatous plaque on the right more than left V4 segment with high-grade narrowing on the right that is progressed. Diffusely patent basilar. Fetal type left PCA. Mild left posterior communicating artery stenosis. Advanced right P1 and P2 segment narrowings, the former likely progressed from 2021 Venous  sinuses: Negative Anatomic variants: As above Review of the MIP images confirms the above findings IMPRESSION: 1. No emergent vascular finding. 2. Advanced intracranial atherosclerosis with high-grade right V4, right P1, right P2, and right MCA narrowings. Atherosclerotic disease has progressed from 2021. 3. Cervical carotid atherosclerosis with proximal ICA stenosis measuring 40% on the right and 35% on the left. 4. Right upper lobe pneumonia. Electronically Signed   By: Monte Fantasia M.D.   On: 12/30/2020 09:37   DG Shoulder Right  Result Date: 12/26/2020 CLINICAL DATA:  Pain EXAM: RIGHT SHOULDER - 2+ VIEW COMPARISON:  None. FINDINGS: No acute fracture or dislocation. Mild subcortical sclerosis at the site of rotator cuff insertion consistent with mild degenerative changes. No area of erosion or osseous destruction. No unexpected radiopaque foreign body. Status post median sternotomy and CABG. IMPRESSION: No acute fracture or dislocation. Electronically Signed   By: Valentino Saxon MD   On: 12/26/2020 07:49   CT Head Wo Contrast  Result Date: 12/28/2020 CLINICAL DATA:  Mental status change, unknown cause. EXAM: CT HEAD WITHOUT CONTRAST TECHNIQUE: Contiguous axial images were obtained from the base of the skull through the vertex without intravenous contrast. COMPARISON:  Prior head CT examination 07/28/2020. Prior brain MRI 07/28/2020. FINDINGS: Brain: Mild cerebral and cerebellar atrophy. Chronic cortical/subcortical infarct within the right parietal and occipital lobes (PCA vascular territory). Associated ex vacuo dilatation of the posterior right lateral ventricle. Chronic infarct within the right corona radiata/basal ganglia. Chronic infarct within the right thalamus. Background mild patchy and ill-defined hypoattenuation within the cerebral white matter, nonspecific but compatible with chronic small vessel ischemic disease. There is a lacunar infarct within the left pons and left  pontomesencephalic junction, which is new as compared to the head CT of 07/28/2020 but otherwise age indeterminate. Additional chronic lacunar infarct within the central pons. A small chronic infarct is also questioned within the left cerebellum. There is no acute intracranial hemorrhage. No demarcated cortical infarct. No extra-axial fluid collection. No evidence of intracranial mass. No midline shift. Vascular: No hyperdense vessel.  Atherosclerotic calcifications. Skull: Normal. Negative for fracture or focal lesion. Sinuses/Orbits: Visualized orbits show no acute finding. Trace right maxillary sinus mucosal thickening. IMPRESSION: A lacunar infarct within the left pons and pontomesencephalic junction is new from the prior head CT of 07/28/2020, but otherwise age indeterminate. Consider a brain MRI for further evaluation. Stable background generalized  parenchymal atrophy and chronic ischemic disease with multiple chronic infarcts, as described. Electronically Signed   By: Kellie Simmering DO   On: 12/28/2020 18:37   CT Chest W Contrast  Result Date: 12/29/2020 CLINICAL DATA:  Chest pain or shortness of breath.  Leukocytosis. EXAM: CT CHEST WITH CONTRAST TECHNIQUE: Multidetector CT imaging of the chest was performed during intravenous contrast administration. CONTRAST:  43mL OMNIPAQUE IOHEXOL 300 MG/ML  SOLN COMPARISON:  None. FINDINGS: A noncontrast phase was inadvertently obtained due to IV tubing failure. Cardiovascular: Normal heart size. No pericardial effusion. Prominent atheromatous changes for age. CABG. At the segmental right lower lobe level there is a possible branching filling defect within the pulmonary arterial tree. There are additional areas of pulmonary artery low-density that are clearly from artifact. Mediastinum/Nodes: No adenopathy or mass. Lungs/Pleura: Airspace disease affecting all lobes. Mild interlobular septal thickening at the bases, but not a dominant finding. No pleural effusion or  pneumothorax. Upper Abdomen: Cholelithiasis layering within the gallbladder. Musculoskeletal: No acute finding. A page has been placed to the attending physician. Reference Z vision dashboard. IMPRESSION: 1. Multifocal pneumonia. 2. Possible small right lower lobe pulmonary embolism, although false-positive from artifact is definitely possible. Consider venous workup or follow-up chest CTA. 3. Aortic Atherosclerosis (ICD10-I70.0). 4. Cholelithiasis. Electronically Signed   By: Monte Fantasia M.D.   On: 12/29/2020 09:39   CT ANGIO CHEST PE W OR WO CONTRAST  Result Date: 12/30/2020 CLINICAL DATA:  Rule out PE. EXAM: CT ANGIOGRAPHY CHEST WITH CONTRAST TECHNIQUE: Multidetector CT imaging of the chest was performed using the standard protocol during bolus administration of intravenous contrast. Multiplanar CT image reconstructions and MIPs were obtained to evaluate the vascular anatomy. CONTRAST:  111mL OMNIPAQUE IOHEXOL 350 MG/ML SOLN COMPARISON:  CT chest 12/29/2020 FINDINGS: Cardiovascular: The heart size appears within normal limits. Previous median sternotomy and CABG. Coronary artery calcifications. There are 2 pulmonary artery filling defects within the distal right lower lobar pulmonary artery as well as a segmental branch of the right lower lobe pulmonary artery compatible with pulmonary emboli. Mediastinum/Nodes: Prominent mediastinal lymph nodes are likely reactive. No pathologically enlarged mediastinal, hilar, or axillary lymph nodes. Thyroid gland, trachea, and esophagus demonstrate no significant findings. Lungs/Pleura: No pleural effusion. Dense airspace consolidation within the posterior right upper lobe is again noted and appears unchanged. Patchy airspace densities within both lower lung zones are also noted and appears similar to the previous study. No signs of pneumothorax. Upper Abdomen: No acute abnormality. Gallstones identified. Aortic atherosclerosis. Musculoskeletal: No chest wall  abnormality. No acute or significant osseous findings. Review of the MIP images confirms the above findings. IMPRESSION: 1. Examination is positive for acute pulmonary emboli within the distal right lower lobar pulmonary artery and a segmental branch of the right lower lobe pulmonary artery. 2. Similar appearance of dense airspace consolidation within the posterior right upper lobe and patchy airspace densities within both lower lung zones. 3. Gallstones. 4. Aortic atherosclerosis. Aortic Atherosclerosis (ICD10-I70.0). Electronically Signed   By: Kerby Moors M.D.   On: 12/30/2020 09:18   MR BRAIN WO CONTRAST  Result Date: 12/29/2020 CLINICAL DATA:  Encephalopathy. EXAM: MRI HEAD WITHOUT CONTRAST TECHNIQUE: Multiplanar, multiecho pulse sequences of the brain and surrounding structures were obtained without intravenous contrast. COMPARISON:  Head CT 12/28/2020 and MRI 07/28/2020 FINDINGS: The study is moderately motion degraded. Brain: There is a moderate-sized acute infarct involving the left aspect of the pons and midbrain. Subcentimeter acute infarcts are present in the white matter of the posterior  left temporal lobe and left periatrial white matter, left parietal cortex, and right cingulate gyrus. There is a nonacute lacunar infarct in the left centrum semiovale with shine through on diffusion-weighted imaging. A moderate-sized chronic right parieto-occipital infarct is again noted with associated chronic blood products. Chronic infarcts are present in the pons, thalami, right basal ganglia, and right corona radiata. A small focus of chronic cortical/subcortical hemorrhage in the high posterior right frontal lobe is unchanged. Patchy T2 hyperintensities elsewhere in the cerebral white matter bilaterally are similar to the prior MRI and are nonspecific but compatible with moderately advanced chronic small vessel ischemic disease. There is moderate cerebral atrophy, and there is ex vacuo dilatation of the  right lateral ventricle related to the chronic infarcts. No mass, midline shift, or extra-axial fluid collection is identified. Vascular: Major intracranial vascular flow voids are preserved. Skull and upper cervical spine: Unremarkable bone marrow signal. Sinuses/Orbits: Left cataract extraction. Paranasal sinuses and mastoid air cells are clear. Other: None. IMPRESSION: 1. Moderate-sized acute infarct in the left pons and midbrain. 2. Additional small acute infarcts in the left greater than right cerebral hemispheres. 3. Advanced chronic ischemia with multiple chronic infarcts as above. Electronically Signed   By: Logan Bores M.D.   On: 12/29/2020 12:29   DG Chest Port 1 View  Result Date: 12/28/2020 CLINICAL DATA:  Choking. EXAM: PORTABLE CHEST 1 VIEW COMPARISON:  07/28/2020 FINDINGS: Sequelae of CABG are again identified. The cardiomediastinal silhouette is within normal limits. The lungs are better inflated than on the prior study. No confluent airspace opacity, edema, pleural effusion, pneumothorax is identified. No acute osseous abnormality is seen. IMPRESSION: No active disease. Electronically Signed   By: Logan Bores M.D.   On: 12/28/2020 17:15   EEG adult  Result Date: 12/30/2020 Lora Havens, MD     12/30/2020  1:17 PM Patient Name: [name] MRN: [id number] Epilepsy Attending: Lora Havens Referring Physician/Provider: Dr Phillips Odor Date: 12/30/2020 Duration: 21.51 mins Patient history: 56 yo F with ams. EEG to evaluate for seizure Level of alertness: Awake AEDs during EEG study: None Technical aspects: This EEG study was done with scalp electrodes positioned according to the 10-20 International system of electrode placement. Electrical activity was acquired at a sampling rate of 500Hz and reviewed with a high frequency filter of 70Hz and a low frequency filter of 1Hz. EEG data were recorded continuously and digitally stored. Description: No clear posterior dominant rhythm was  seen. EEG showed continuous generalized polymorphic sharply contoured 3 to 6 Hz theta-delta slowing. Hyperventilation and photic stimulation were not performed.   ABNORMALITY - Continuous slow, generalized IMPRESSION: This study is suggestive of moderate diffuse encephalopathy, nonspecific etiology but likely related to underlying stokes. No seizures or epileptiform discharges were seen throughout the recording. Priyanka O Yadav   VAS [name] LOWER EXTREMITY VENOUS (DVT)  Result Date: 12/30/2020  Lower Venous DVT Study Patient Name:  [name]  Date of Exam:   12/29/2020 Medical Rec #: [id number]          Accession #:    [id number] Date of Birth: 07/31/1966           Patient Gender: F Patient Age:   3Y Exam Location:  [facility] Procedure:      VAS [name] LOWER EXTREMITY VENOUS (DVT) Referring Phys: [id number] [place] --------------------------------------------------------------------------------  Indications: SOB.  Risk Factors: Bilateral below knee amputations. Comparison Study: No prior study on file Performing Technologist: Sharion Dove RVS  Examination Guidelines: A complete evaluation includes B-mode imaging, spectral Doppler, color Doppler, and power Doppler as needed of all accessible portions of each vessel. Bilateral testing is considered an integral part of a complete examination. Limited examinations for reoccurring indications may be performed as noted. The reflux portion of the exam is performed with the patient in reverse Trendelenburg.  +---------+---------------+---------+-----------+----------+--------------+ RIGHT    CompressibilityPhasicitySpontaneityPropertiesThrombus Aging +---------+---------------+---------+-----------+----------+--------------+ CFV      Full           Yes      Yes                                 +---------+---------------+---------+-----------+----------+--------------+ SFJ      Full                                                         +---------+---------------+---------+-----------+----------+--------------+ FV Prox  Full                                                        +---------+---------------+---------+-----------+----------+--------------+ FV Mid   Full                                                        +---------+---------------+---------+-----------+----------+--------------+ FV DistalFull                                                        +---------+---------------+---------+-----------+----------+--------------+ PFV      Full                                                        +---------+---------------+---------+-----------+----------+--------------+   +---------+---------------+---------+-----------+----------+--------------+ LEFT     CompressibilityPhasicitySpontaneityPropertiesThrombus Aging +---------+---------------+---------+-----------+----------+--------------+ CFV      Full           Yes      Yes                                 +---------+---------------+---------+-----------+----------+--------------+ SFJ      Full                                                        +---------+---------------+---------+-----------+----------+--------------+ FV Prox  Full                                                        +---------+---------------+---------+-----------+----------+--------------+  FV Mid   Full                                                        +---------+---------------+---------+-----------+----------+--------------+ FV DistalFull                                                        +---------+---------------+---------+-----------+----------+--------------+ PFV      Full                                                        +---------+---------------+---------+-----------+----------+--------------+     Summary: RIGHT: - There is no evidence of deep vein thrombosis in the lower extremity.  LEFT: - There is no evidence of  deep vein thrombosis in the lower extremity.  *See table(s) above for measurements and observations. Electronically signed by Jamelle Haring on 12/30/2020 at 8:53:10 AM.    Final     Microbiology: Recent Results (from the past 240 hour(s))  Resp Panel by RT-PCR (Flu A&B, Covid) Nasopharyngeal Swab     Status: None   Collection Time: 12/28/20  9:09 PM   Specimen: Nasopharyngeal Swab; Nasopharyngeal(NP) swabs in vial transport medium  Result Value Ref Range Status   SARS Coronavirus 2 by RT PCR NEGATIVE NEGATIVE Final    Comment: (NOTE) SARS-CoV-2 target nucleic acids are NOT DETECTED.  The SARS-CoV-2 RNA is generally detectable in upper respiratory specimens during the acute phase of infection. The lowest concentration of SARS-CoV-2 viral copies this assay can detect is 138 copies/mL. A negative result does not preclude SARS-Cov-2 infection and should not be used as the sole basis for treatment or other patient management decisions. A negative result may occur with  improper specimen collection/handling, submission of specimen other than nasopharyngeal swab, presence of viral mutation(s) within the areas targeted by this assay, and inadequate number of viral copies(<138 copies/mL). A negative result must be combined with clinical observations, patient history, and epidemiological information. The expected result is Negative.  Fact Sheet for Patients:  EntrepreneurPulse.com.au  Fact Sheet for Healthcare Providers:  IncredibleEmployment.be  This test is no t yet approved or cleared by the [place] FDA and  has been authorized for detection and/or diagnosis of SARS-CoV-2 by FDA under an Emergency Use Authorization (EUA). This EUA will remain  in effect (meaning this test can be used) for the duration of the COVID-19 declaration under Section 564(b)(1) of the Act, 21 U.S.C.section 360bbb-3(b)(1), unless the authorization is terminated  or revoked  sooner.       Influenza A by PCR NEGATIVE NEGATIVE Final   Influenza B by PCR NEGATIVE NEGATIVE Final    Comment: (NOTE) The Xpert Xpress SARS-CoV-2/FLU/RSV plus assay is intended as an aid in the diagnosis of influenza from Nasopharyngeal swab specimens and should not be used as a sole basis for treatment. Nasal washings and aspirates are unacceptable for Xpert Xpress SARS-CoV-2/FLU/RSV testing.  Fact Sheet for Patients: EntrepreneurPulse.com.au  Fact Sheet for Healthcare Providers: IncredibleEmployment.be  This test is not yet  approved or cleared by the [place] and has been authorized for detection and/or diagnosis of SARS-CoV-2 by FDA under an Emergency Use Authorization (EUA). This EUA will remain in effect (meaning this test can be used) for the duration of the COVID-19 declaration under Section 564(b)(1) of the Act, 21 U.S.C. section 360bbb-3(b)(1), unless the authorization is terminated or revoked.  Performed at [facility], [place] [address], [place], [place] [address]   Blood culture (routine x 2)     Status: None   Collection Time: 12/28/20  9:09 PM   Specimen: BLOOD  Result Value Ref Range Status   Specimen Description BLOOD RIGHT ANTECUBITAL  Final   Special Requests   Final    BOTTLES DRAWN AEROBIC AND ANAEROBIC Blood Culture results may not be optimal due to an excessive volume of blood received in culture bottles   Culture   Final    NO GROWTH 5 DAYS Performed at [facility], [place] [address] [place], [place] [address]    Report Status 01/02/2021 FINAL  Final  Blood culture (routine x 2)     Status: None   Collection Time: 12/28/20  9:09 PM   Specimen: BLOOD RIGHT FOREARM  Result Value Ref Range Status   Specimen Description BLOOD RIGHT FOREARM  Final   Special Requests   Final    BOTTLES DRAWN AEROBIC AND ANAEROBIC Blood Culture results may not be optimal due to an excessive volume of blood received  in culture bottles   Culture   Final    NO GROWTH 5 DAYS Performed at [facility], [place] [address], [place],  [address]    Report Status 01/02/2021 FINAL  Final     Labs: Basic Metabolic Panel: Recent Labs  Lab 12/29/20 0024 12/30/20 0036 12/31/20 0609 01/01/21 0550 01/02/21 0132  NA 142 144 144 144 144  K 3.4* 3.0* 3.8 4.1 3.8  CL 102 105 106 107 107  CO2 _0 GLUCOSE 286* 76 132* 153* 157*  BUN 23* 21* _1 CREATININE 1.19* 1.05* 1.16* 1.54* 1.49*  CALCIUM 8.7* 8.7* 7.9* 7.9* 8.4*  MG 1.4* 3.5*  --  1.9  --   PHOS  --  2.1* 3.3  --   --    Liver Function Tests: Recent Labs  Lab 12/28/20 2011  AST 21  ALT 16  ALKPHOS 81  BILITOT 1.0  PROT 7.2  ALBUMIN 2.7*   Recent Labs  Lab 12/28/20 2011  LIPASE 31   No results for input(s): AMMONIA in the last 168 hours. CBC: Recent Labs  Lab 12/28/20 2011 12/29/20 0024 12/30/20 0036 12/31/20 0609 01/01/21 0550 01/01/21 1559 01/02/21 0132  WBC 14.4*   < > 15.5* 13.8* 10.1 9.4 8.7  NEUTROABS 12.0*  --   --   --   --   --   --   HGB 11.3*   < > 8.8* 7.9* 8.1* 8.3* 7.7*  HCT 35.7*   < > 28.0* 25.6* 26.4* 27.5* 25.3*  MCV 87.9   < > 87.2 88.0 89.2 89.6 89.4  PLT 617*   < > 476* 455* 433* 469* 438*   < > = values in this interval not displayed.    CBG: Recent Labs  Lab 01/02/21 2018 01/03/21 0002 01/03/21 0351 01/03/21 0813 01/03/21 1243  GLUCAP 124* 136* 93 136* 106*       Signed:  Oswald Hillock MD.  [facility] 01/03/2021,  2:33 PM

## 2021-01-03 NOTE — Progress Notes (Signed)
Patient being discharged home with hospice.  Patient to be transported by [facility].  Discharge instructions and prescription information placed in the packet for transport.  IV removed with the catheter intact.

## 2021-01-03 NOTE — TOC Transition Note (Signed)
Transition of Care [facility]) - CM/SW Discharge Note   Patient Details  Name: [name] MRN: [id number] Date of Birth: 1966/05/08  Transition of Care [facility]) CM/SW Contact:  Bethena Roys, RN Phone Number: 01/03/2021, 2:28 PM   Clinical Narrative:  Suction equipment has arrived to the patient's room for transport home. Case Manager called the son and family will be at the home for patient's arrival. Corey Harold has been called and the Case Manager scheduled a pick up for 3:30pm. Family is aware of pick-up time. No further needs from Case Manager at this time.    Final next level of care: Home w Hospice Care Barriers to Discharge: Barriers Resolved   Patient Goals and CMS Choice Patient states their goals for this hospitalization and ongoing recovery are:: home with hospice CMS Medicare.gov Compare Post Acute Care list provided to:: Patient Represenative (must comment) Choice offered to / list presented to : Adult Children  Discharge Plan and Services   Discharge Planning Services: CM Consult Post Acute Care Choice: Hospice,Durable Medical Equipment          DME Arranged: Gilford Rile rolling,Oxygen DME Agency: Other - Comment     Representative spoke with at DME Agency: per Hospice HH Arranged: Disease Management [place] Agency: [place] Date [place]: 01/01/21 Time [place]: 4034 Representative spoke with at [place]: [place] ([place]) Interventions     Readmission Risk Interventions Readmission Risk Prevention Plan 01/02/2021 12/14/2018 07/06/2018  Transportation Screening Complete Complete Complete  PCP or Specialist Appt within 5-7 Days Complete - Complete  PCP or Specialist Appt within 3-5 Days - Complete -  Home Care Screening Complete - Complete  Medication Review (RN CM) Complete - Complete  HRI or [place] - Complete -  Social Work Consult for [place] Planning/Counseling - Complete -   Palliative Care Screening - Not Applicable -  Some recent data might be hidden

## 2021-01-03 NOTE — Progress Notes (Signed)
Patient son spoke with charge nurse once patient was discharged.  Son upset that patient was discharged so late in the evening, states that has not CNA at home to take care of patient.  Charge nurse advised that patient had already been discharged via [place].  PT[facility] called to advised  Charge nurse that patients family is refusing to accept patient.  Advised PTAR that would take patient back to emergency room, that can not accept patient back to room once discharged

## 2021-01-03 NOTE — Plan of Care (Signed)
  Problem: Activity: Goal: Risk for activity intolerance will decrease Outcome: Progressing   Problem: Nutrition: Goal: Adequate nutrition will be maintained Outcome: Progressing   Problem: Coping: Goal: Level of anxiety will decrease Outcome: Progressing   Problem: Pain Managment: Goal: General experience of comfort will improve Outcome: Progressing   

## 2021-01-03 NOTE — Progress Notes (Signed)
  Speech Language Pathology Treatment: Dysphagia  Patient Details Name: [name] MRN: [id number] DOB: 1965-12-06 Today's Date: 01/03/2021 Time: 8453-6468 SLP Time Calculation (min) (ACUTE ONLY): 15 min  Assessment / Plan / Recommendation Clinical Impression  Pt continues to exhibit signs of reduced secretion management, including wet vocal quality at baseline that is ultimately cleared when given small pieces of ice and minimal amounts of water. Her spontaneous cough in response to POs is stronger than her volitional cough, resulting in better clearance of her vocal quality. In discussion with pt and nursing, pt has not been taking in any PO and she says she is not wanting any. SLP also called pt's son, [name], who verbalized his understanding of current swallowing function and aspiration risk. We discussed using oral care and swabs to help provide comfort. He is asking for suction at home as well (MD and TOC also made aware). Will continue to follow acutely. Would only provide POs if for comfort feeding.    HPI HPI: Patient is a 55 y.o. female with PMH: DM-2 with vascular disease, HTN, h/o CVA (2021), bilateral BKA, ICH, CAD, hypothyroidism who lives with son who is her caregiver. (patient is bedbound) She was brought to ED secondary to son observing increasing SOB, vomit of nonbloody emesis followed by patient starting to gurgle which prompted him to call EMS. When EMS arrived they suctioned significant amount of thick yellowish phlegm from back of her throat; oxygen saturations were 85%. She was febrile at 101/6 in ED and with elevated WBC count. CXR negative for active disease, MRI revealed Moderate-sized acute infarct in the left pons and midbrain, additional small acute infarcts in left greater than right cerebral hemispheres.      SLP Plan  Continue with current plan of care       Recommendations  Diet recommendations: Other(comment) (POs for comfort only) Medication  Administration: Via alternative means                Oral Care Recommendations: Oral care QID Follow up Recommendations: 24 hour supervision/assistance;Skilled Nursing facility SLP Visit Diagnosis: Dysphagia, unspecified (R13.10) Plan: Continue with current plan of care       GO                Osie Bond., M.A. [facility] Pager [phone] Office [phone]  01/03/2021, 12:37 PM

## 2021-01-03 NOTE — Progress Notes (Signed)
Per [facility] patient will be readmitted  Patient arrived via [facility] to room 4E-16, patient alert, patient's belongs of suction canister, previous discharge instructions and stuffed animal arrived with patient.

## 2021-01-04 LAB — GLUCOSE, CAPILLARY
Glucose-Capillary: 86 mg/dL (ref 70–99)
Glucose-Capillary: 86 mg/dL (ref 70–99)
Glucose-Capillary: 93 mg/dL (ref 70–99)

## 2021-01-04 NOTE — Progress Notes (Signed)
[facility] arrived to pick up pt. Spoke to pts home aid, [name], who was made aware of pt in transit.

## 2021-01-04 NOTE — TOC Transition Note (Addendum)
Transition of Care (TOC) - CM/SW Discharge Note Marvetta Gibbons RN, BSN Transitions of Care Unit 4E- RN Case Manager See Treatment Team for direct phone #    Patient Details  Name: [name] MRN: [id number] Date of Birth: 1966-01-30  Transition of Care [facility]) CM/SW Contact:  Dawayne Patricia, RN Phone Number: 01/04/2021, 11:09 AM   Clinical Narrative:    Noted events from yesterday, Have attempted to reach son Tommi Rumps this am, however no answer, VM full and no response via txt msg. Received a VM from [place] that will be assisting family with care of pt in the home, return call to [facility] made to discuss transportation needs for today. Per Juliette Mangle is not feeling well today, family still prepared for pt's return home, Lenna Sciara states she is prepared to assist family and be there when Hospice RN is there to learn what needs to be done for pt, [name] to go to parents home to confirm transport home.  1030- received return call from [place] who is at the parents home- Safari Cinque- patient's mom placed on phone and confirmed that they are at the home and are expecting patients arrival, confirmed that they want patient transported by EMS- Rayne Du also provided verbal permission to contact [facility] as first contact for Hospice and other needs today to be able to schedule needed visits with Hospice for today. Lenna Sciara has provided her contact # as [phone].   1100- [facility] called to schedule pickup for pt to be transported home- to parents home- [address] [place] Paperwork placed on chart, bedside RN updated. Home suction is at the bedside to send with pt to home.   1120- updated [place] - provided [name] Perry's (CNA that will be assisting family) contact info.     Final next level of care: Home w Hospice Care Barriers to Discharge: Barriers Resolved   Patient Goals and CMS Choice Patient states their goals for this hospitalization and  ongoing recovery are:: home with hospice CMS Medicare.gov Compare Post Acute Care list provided to:: Patient Represenative (must comment) Choice offered to / list presented to : Adult Children  Discharge Placement                 Home with Hospice      Discharge Plan and Services   Discharge Planning Services: CM Consult Post Acute Care Choice: Hospice,Durable Medical Equipment          DME Arranged: Gilford Rile rolling,Oxygen DME Agency: Other - Comment     Representative spoke with at DME Agency: per Hospice HH Arranged: Disease Management [place] Agency: [place] Date [place]: 01/01/21 Time [place]: 9935 Representative spoke with at [place]: [place] ([place]) Interventions     Readmission Risk Interventions Readmission Risk Prevention Plan 01/02/2021 12/14/2018 07/06/2018  Transportation Screening Complete Complete Complete  PCP or Specialist Appt within 5-7 Days Complete - Complete  PCP or Specialist Appt within 3-5 Days - Complete -  Home Care Screening Complete - Complete  Medication Review (RN CM) Complete - Complete  HRI or [place] - Complete -  Social Work Consult for [place] Planning/Counseling - Complete -  Palliative Care Screening - Not Applicable -  Some recent data might be hidden

## 2021-01-04 NOTE — Progress Notes (Signed)
Progress Note  Chart including DC summary briefly reviewed.  Overnight events noted- issues with [facility] related transport delays and hence patient did not discharge home last night as planned.  I interviewed and examined patient this morning in her hospital room/bed.  As per RN, no acute issues reported.  Patient alert and oriented only to self.  Denies complaints.  Denied pain, dyspnea or cough.  Young female, looks older than stated age, lying comfortably propped up in bed without distress.  Oral mucosa: Edentulous but moist. Vitals:   01/03/21 1923 01/03/21 2315 01/04/21 0411 01/04/21 0832  BP: (!) 189/93 (!) 178/112 (!) 174/89 (!) 161/88  Pulse: 77 84 70 77  Resp: 16 17 16 15   Temp: 98 F (36.7 C) 98.1 F (36.7 C) 97.8 F (36.6 C) 98.6 F (37 C)  TempSrc: Oral Oral Oral Oral  SpO2: 98% 99% 100% 100%  Weight:      Height:       Respiratory system: Poor inspiratory effort.  Slightly diminished breath sounds in the bases but no wheezing, crackles or rhonchi.  Rest clear to auscultation.  No increased work of breathing.  Not hypoxic on room air. CVS: S1 and S2 heard, RRR.  No JVD, murmurs.  Telemetry personally reviewed: Sinus rhythm. Abdominal exam: Nondistended, soft and nontender.  No organomegaly or masses appreciated.  Normal bowel sounds heard. CNS: Alert and oriented only to self, not to place "home".  Follows simple instructions.  No cranial nerve deficits. Extremities: Bilateral BKA stumps, healed.  Right upper extremity grade 1 x 5 power.  Left upper extremity at least grade 4 x 5 power. GU: Has Foley catheter in as per nursing, she had Foley prior to admission and will be discharging with a chronic Foley.  CBGs reviewed: Controlled.  Otherwise no new labs since 4/27.  Assessment and plan:  1. Acute respiratory failure with hypoxia due to aspiration pneumonia and newly diagnosed acute PE: Completed course of antibiotics in the hospital.  Hypoxia resolved. 2. Essential  hypertension: Variable/fluctuating.  On amlodipine, losartan and metoprolol.  Since going home on home hospice, not sure how aggressive this needs to be. 3. Sepsis secondary to aspiration pneumonia, POA: Completed treatment.  Sepsis resolved. 4. Acute left embolic stroke: Evaluation per DC summary.  Family declined invasive work-up i.e. TEE and IVC filter placement.  She was started on Eliquis but switched to Lovenox due to poor oral intake.  Discharged on Lovenox with hospice MD to decide during follow-up whether to continue this or not.  As per my discussion with patient's RN, RN and [facility] team educating family regarding administration of Lovenox.  Patient also on atorvastatin. 5. New diagnosis of acute right-sided PE: Bilateral lower extremity venous Dopplers negative for DVT.  Family declined invasive procedures i.e. IVC filter placement.  Lovenox discussion as above. 6. GOC: Palliative care was consulted 7. Acute metabolic encephalopathy: Multifactorial due to acute CVA, acute illness, aspiration pneumonia etc.  Improved.  I do not know her baseline. 8. Acute kidney injury complicating stage II CKD: Briefly on IV fluids, likely discontinued after decision made to transition to more comfort oriented care. 9. Dysphagia with concern for aspiration: Comfort feeds. 10. Hypokalemia: Resolved. 11. Type II DM: A1c 8 on 4/19: Controlled CBGs. 12. S/p bilateral BKA's:  As per my discussion with patient's RN this afternoon, patient was discharged home with [facility]AR after nursing and [facility] team carefully and closely coordinated with patient's family and hospice services.  Vernell Leep, MD, [place], [facility].  [facility]  To contact the attending provider between 7A-7P or the covering provider during after hours 7P-7A, please log into the web site [email or web address] and access using universal [place] password for that web site. If you do not have the password, please call the hospital operator.

## 2021-01-06 DIAGNOSIS — F4323 Adjustment disorder with mixed anxiety and depressed mood: Secondary | ICD-10-CM | POA: Diagnosis not present

## 2021-01-06 DIAGNOSIS — I130 Hypertensive heart and chronic kidney disease with heart failure and stage 1 through stage 4 chronic kidney disease, or unspecified chronic kidney disease: Secondary | ICD-10-CM | POA: Diagnosis not present

## 2021-01-06 DIAGNOSIS — R011 Cardiac murmur, unspecified: Secondary | ICD-10-CM | POA: Diagnosis not present

## 2021-01-06 DIAGNOSIS — I2510 Atherosclerotic heart disease of native coronary artery without angina pectoris: Secondary | ICD-10-CM | POA: Diagnosis not present

## 2021-01-06 DIAGNOSIS — N1831 Chronic kidney disease, stage 3a: Secondary | ICD-10-CM | POA: Diagnosis not present

## 2021-01-06 DIAGNOSIS — K219 Gastro-esophageal reflux disease without esophagitis: Secondary | ICD-10-CM | POA: Diagnosis not present

## 2021-01-06 DIAGNOSIS — E1122 Type 2 diabetes mellitus with diabetic chronic kidney disease: Secondary | ICD-10-CM | POA: Diagnosis not present

## 2021-01-06 DIAGNOSIS — I6330 Cerebral infarction due to thrombosis of unspecified cerebral artery: Secondary | ICD-10-CM | POA: Diagnosis not present

## 2021-01-06 DIAGNOSIS — Z951 Presence of aortocoronary bypass graft: Secondary | ICD-10-CM | POA: Diagnosis not present

## 2021-01-06 DIAGNOSIS — Z89511 Acquired absence of right leg below knee: Secondary | ICD-10-CM | POA: Diagnosis not present

## 2021-01-06 DIAGNOSIS — Z89512 Acquired absence of left leg below knee: Secondary | ICD-10-CM | POA: Diagnosis not present

## 2021-01-06 DIAGNOSIS — R1310 Dysphagia, unspecified: Secondary | ICD-10-CM | POA: Diagnosis not present

## 2021-01-06 DIAGNOSIS — I5032 Chronic diastolic (congestive) heart failure: Secondary | ICD-10-CM | POA: Diagnosis not present

## 2021-01-07 ENCOUNTER — Ambulatory Visit: Payer: BC Managed Care – PPO | Admitting: Orthopedic Surgery

## 2021-01-07 DIAGNOSIS — R1310 Dysphagia, unspecified: Secondary | ICD-10-CM | POA: Diagnosis not present

## 2021-01-07 DIAGNOSIS — I5032 Chronic diastolic (congestive) heart failure: Secondary | ICD-10-CM | POA: Diagnosis not present

## 2021-01-07 DIAGNOSIS — I2510 Atherosclerotic heart disease of native coronary artery without angina pectoris: Secondary | ICD-10-CM | POA: Diagnosis not present

## 2021-01-07 DIAGNOSIS — Z89511 Acquired absence of right leg below knee: Secondary | ICD-10-CM | POA: Diagnosis not present

## 2021-01-07 DIAGNOSIS — E1122 Type 2 diabetes mellitus with diabetic chronic kidney disease: Secondary | ICD-10-CM | POA: Diagnosis not present

## 2021-01-07 DIAGNOSIS — I6330 Cerebral infarction due to thrombosis of unspecified cerebral artery: Secondary | ICD-10-CM | POA: Diagnosis not present

## 2021-01-07 DIAGNOSIS — F4323 Adjustment disorder with mixed anxiety and depressed mood: Secondary | ICD-10-CM | POA: Diagnosis not present

## 2021-01-07 DIAGNOSIS — Z89512 Acquired absence of left leg below knee: Secondary | ICD-10-CM | POA: Diagnosis not present

## 2021-01-07 DIAGNOSIS — Z951 Presence of aortocoronary bypass graft: Secondary | ICD-10-CM | POA: Diagnosis not present

## 2021-01-07 DIAGNOSIS — N1831 Chronic kidney disease, stage 3a: Secondary | ICD-10-CM | POA: Diagnosis not present

## 2021-01-07 DIAGNOSIS — K219 Gastro-esophageal reflux disease without esophagitis: Secondary | ICD-10-CM | POA: Diagnosis not present

## 2021-01-07 DIAGNOSIS — R011 Cardiac murmur, unspecified: Secondary | ICD-10-CM | POA: Diagnosis not present

## 2021-01-07 DIAGNOSIS — I130 Hypertensive heart and chronic kidney disease with heart failure and stage 1 through stage 4 chronic kidney disease, or unspecified chronic kidney disease: Secondary | ICD-10-CM | POA: Diagnosis not present

## 2021-01-08 DIAGNOSIS — R011 Cardiac murmur, unspecified: Secondary | ICD-10-CM | POA: Diagnosis not present

## 2021-01-08 DIAGNOSIS — K219 Gastro-esophageal reflux disease without esophagitis: Secondary | ICD-10-CM | POA: Diagnosis not present

## 2021-01-08 DIAGNOSIS — I5032 Chronic diastolic (congestive) heart failure: Secondary | ICD-10-CM | POA: Diagnosis not present

## 2021-01-08 DIAGNOSIS — I2510 Atherosclerotic heart disease of native coronary artery without angina pectoris: Secondary | ICD-10-CM | POA: Diagnosis not present

## 2021-01-08 DIAGNOSIS — Z89512 Acquired absence of left leg below knee: Secondary | ICD-10-CM | POA: Diagnosis not present

## 2021-01-08 DIAGNOSIS — F4323 Adjustment disorder with mixed anxiety and depressed mood: Secondary | ICD-10-CM | POA: Diagnosis not present

## 2021-01-08 DIAGNOSIS — I6330 Cerebral infarction due to thrombosis of unspecified cerebral artery: Secondary | ICD-10-CM | POA: Diagnosis not present

## 2021-01-08 DIAGNOSIS — N1831 Chronic kidney disease, stage 3a: Secondary | ICD-10-CM | POA: Diagnosis not present

## 2021-01-08 DIAGNOSIS — Z951 Presence of aortocoronary bypass graft: Secondary | ICD-10-CM | POA: Diagnosis not present

## 2021-01-08 DIAGNOSIS — I130 Hypertensive heart and chronic kidney disease with heart failure and stage 1 through stage 4 chronic kidney disease, or unspecified chronic kidney disease: Secondary | ICD-10-CM | POA: Diagnosis not present

## 2021-01-08 DIAGNOSIS — E1122 Type 2 diabetes mellitus with diabetic chronic kidney disease: Secondary | ICD-10-CM | POA: Diagnosis not present

## 2021-01-08 DIAGNOSIS — Z89511 Acquired absence of right leg below knee: Secondary | ICD-10-CM | POA: Diagnosis not present

## 2021-01-08 DIAGNOSIS — R1310 Dysphagia, unspecified: Secondary | ICD-10-CM | POA: Diagnosis not present

## 2021-01-09 DIAGNOSIS — Z951 Presence of aortocoronary bypass graft: Secondary | ICD-10-CM | POA: Diagnosis not present

## 2021-01-09 DIAGNOSIS — I2510 Atherosclerotic heart disease of native coronary artery without angina pectoris: Secondary | ICD-10-CM | POA: Diagnosis not present

## 2021-01-09 DIAGNOSIS — I5032 Chronic diastolic (congestive) heart failure: Secondary | ICD-10-CM | POA: Diagnosis not present

## 2021-01-09 DIAGNOSIS — I6330 Cerebral infarction due to thrombosis of unspecified cerebral artery: Secondary | ICD-10-CM | POA: Diagnosis not present

## 2021-01-09 DIAGNOSIS — Z89512 Acquired absence of left leg below knee: Secondary | ICD-10-CM | POA: Diagnosis not present

## 2021-01-09 DIAGNOSIS — F4323 Adjustment disorder with mixed anxiety and depressed mood: Secondary | ICD-10-CM | POA: Diagnosis not present

## 2021-01-09 DIAGNOSIS — I130 Hypertensive heart and chronic kidney disease with heart failure and stage 1 through stage 4 chronic kidney disease, or unspecified chronic kidney disease: Secondary | ICD-10-CM | POA: Diagnosis not present

## 2021-01-09 DIAGNOSIS — Z89511 Acquired absence of right leg below knee: Secondary | ICD-10-CM | POA: Diagnosis not present

## 2021-01-09 DIAGNOSIS — R011 Cardiac murmur, unspecified: Secondary | ICD-10-CM | POA: Diagnosis not present

## 2021-01-09 DIAGNOSIS — R1310 Dysphagia, unspecified: Secondary | ICD-10-CM | POA: Diagnosis not present

## 2021-01-09 DIAGNOSIS — K219 Gastro-esophageal reflux disease without esophagitis: Secondary | ICD-10-CM | POA: Diagnosis not present

## 2021-01-09 DIAGNOSIS — E1122 Type 2 diabetes mellitus with diabetic chronic kidney disease: Secondary | ICD-10-CM | POA: Diagnosis not present

## 2021-01-09 DIAGNOSIS — N1831 Chronic kidney disease, stage 3a: Secondary | ICD-10-CM | POA: Diagnosis not present

## 2021-01-10 DIAGNOSIS — Z89511 Acquired absence of right leg below knee: Secondary | ICD-10-CM | POA: Diagnosis not present

## 2021-01-10 DIAGNOSIS — R011 Cardiac murmur, unspecified: Secondary | ICD-10-CM | POA: Diagnosis not present

## 2021-01-10 DIAGNOSIS — I6330 Cerebral infarction due to thrombosis of unspecified cerebral artery: Secondary | ICD-10-CM | POA: Diagnosis not present

## 2021-01-10 DIAGNOSIS — K219 Gastro-esophageal reflux disease without esophagitis: Secondary | ICD-10-CM | POA: Diagnosis not present

## 2021-01-10 DIAGNOSIS — F4323 Adjustment disorder with mixed anxiety and depressed mood: Secondary | ICD-10-CM | POA: Diagnosis not present

## 2021-01-10 DIAGNOSIS — E1122 Type 2 diabetes mellitus with diabetic chronic kidney disease: Secondary | ICD-10-CM | POA: Diagnosis not present

## 2021-01-10 DIAGNOSIS — Z89512 Acquired absence of left leg below knee: Secondary | ICD-10-CM | POA: Diagnosis not present

## 2021-01-10 DIAGNOSIS — R1310 Dysphagia, unspecified: Secondary | ICD-10-CM | POA: Diagnosis not present

## 2021-01-10 DIAGNOSIS — N1831 Chronic kidney disease, stage 3a: Secondary | ICD-10-CM | POA: Diagnosis not present

## 2021-01-10 DIAGNOSIS — I2510 Atherosclerotic heart disease of native coronary artery without angina pectoris: Secondary | ICD-10-CM | POA: Diagnosis not present

## 2021-01-10 DIAGNOSIS — I130 Hypertensive heart and chronic kidney disease with heart failure and stage 1 through stage 4 chronic kidney disease, or unspecified chronic kidney disease: Secondary | ICD-10-CM | POA: Diagnosis not present

## 2021-01-10 DIAGNOSIS — I5032 Chronic diastolic (congestive) heart failure: Secondary | ICD-10-CM | POA: Diagnosis not present

## 2021-01-10 DIAGNOSIS — Z951 Presence of aortocoronary bypass graft: Secondary | ICD-10-CM | POA: Diagnosis not present

## 2021-01-11 DIAGNOSIS — R1310 Dysphagia, unspecified: Secondary | ICD-10-CM | POA: Diagnosis not present

## 2021-01-11 DIAGNOSIS — F4323 Adjustment disorder with mixed anxiety and depressed mood: Secondary | ICD-10-CM | POA: Diagnosis not present

## 2021-01-11 DIAGNOSIS — E1122 Type 2 diabetes mellitus with diabetic chronic kidney disease: Secondary | ICD-10-CM | POA: Diagnosis not present

## 2021-01-11 DIAGNOSIS — N1831 Chronic kidney disease, stage 3a: Secondary | ICD-10-CM | POA: Diagnosis not present

## 2021-01-11 DIAGNOSIS — K219 Gastro-esophageal reflux disease without esophagitis: Secondary | ICD-10-CM | POA: Diagnosis not present

## 2021-01-11 DIAGNOSIS — I2510 Atherosclerotic heart disease of native coronary artery without angina pectoris: Secondary | ICD-10-CM | POA: Diagnosis not present

## 2021-01-11 DIAGNOSIS — I130 Hypertensive heart and chronic kidney disease with heart failure and stage 1 through stage 4 chronic kidney disease, or unspecified chronic kidney disease: Secondary | ICD-10-CM | POA: Diagnosis not present

## 2021-01-11 DIAGNOSIS — Z89511 Acquired absence of right leg below knee: Secondary | ICD-10-CM | POA: Diagnosis not present

## 2021-01-11 DIAGNOSIS — Z951 Presence of aortocoronary bypass graft: Secondary | ICD-10-CM | POA: Diagnosis not present

## 2021-01-11 DIAGNOSIS — Z89512 Acquired absence of left leg below knee: Secondary | ICD-10-CM | POA: Diagnosis not present

## 2021-01-11 DIAGNOSIS — R011 Cardiac murmur, unspecified: Secondary | ICD-10-CM | POA: Diagnosis not present

## 2021-01-11 DIAGNOSIS — I6330 Cerebral infarction due to thrombosis of unspecified cerebral artery: Secondary | ICD-10-CM | POA: Diagnosis not present

## 2021-01-11 DIAGNOSIS — I5032 Chronic diastolic (congestive) heart failure: Secondary | ICD-10-CM | POA: Diagnosis not present

## 2021-01-12 DIAGNOSIS — E1122 Type 2 diabetes mellitus with diabetic chronic kidney disease: Secondary | ICD-10-CM | POA: Diagnosis not present

## 2021-01-12 DIAGNOSIS — Z951 Presence of aortocoronary bypass graft: Secondary | ICD-10-CM | POA: Diagnosis not present

## 2021-01-12 DIAGNOSIS — I130 Hypertensive heart and chronic kidney disease with heart failure and stage 1 through stage 4 chronic kidney disease, or unspecified chronic kidney disease: Secondary | ICD-10-CM | POA: Diagnosis not present

## 2021-01-12 DIAGNOSIS — I2510 Atherosclerotic heart disease of native coronary artery without angina pectoris: Secondary | ICD-10-CM | POA: Diagnosis not present

## 2021-01-12 DIAGNOSIS — R011 Cardiac murmur, unspecified: Secondary | ICD-10-CM | POA: Diagnosis not present

## 2021-01-12 DIAGNOSIS — Z89511 Acquired absence of right leg below knee: Secondary | ICD-10-CM | POA: Diagnosis not present

## 2021-01-12 DIAGNOSIS — N1831 Chronic kidney disease, stage 3a: Secondary | ICD-10-CM | POA: Diagnosis not present

## 2021-01-12 DIAGNOSIS — R1310 Dysphagia, unspecified: Secondary | ICD-10-CM | POA: Diagnosis not present

## 2021-01-12 DIAGNOSIS — F4323 Adjustment disorder with mixed anxiety and depressed mood: Secondary | ICD-10-CM | POA: Diagnosis not present

## 2021-01-12 DIAGNOSIS — I5032 Chronic diastolic (congestive) heart failure: Secondary | ICD-10-CM | POA: Diagnosis not present

## 2021-01-12 DIAGNOSIS — Z89512 Acquired absence of left leg below knee: Secondary | ICD-10-CM | POA: Diagnosis not present

## 2021-01-12 DIAGNOSIS — I6330 Cerebral infarction due to thrombosis of unspecified cerebral artery: Secondary | ICD-10-CM | POA: Diagnosis not present

## 2021-01-12 DIAGNOSIS — K219 Gastro-esophageal reflux disease without esophagitis: Secondary | ICD-10-CM | POA: Diagnosis not present

## 2021-01-13 DIAGNOSIS — I6330 Cerebral infarction due to thrombosis of unspecified cerebral artery: Secondary | ICD-10-CM | POA: Diagnosis not present

## 2021-01-13 DIAGNOSIS — N1831 Chronic kidney disease, stage 3a: Secondary | ICD-10-CM | POA: Diagnosis not present

## 2021-01-13 DIAGNOSIS — I130 Hypertensive heart and chronic kidney disease with heart failure and stage 1 through stage 4 chronic kidney disease, or unspecified chronic kidney disease: Secondary | ICD-10-CM | POA: Diagnosis not present

## 2021-01-13 DIAGNOSIS — I5032 Chronic diastolic (congestive) heart failure: Secondary | ICD-10-CM | POA: Diagnosis not present

## 2021-01-13 DIAGNOSIS — I2510 Atherosclerotic heart disease of native coronary artery without angina pectoris: Secondary | ICD-10-CM | POA: Diagnosis not present

## 2021-01-13 DIAGNOSIS — R1310 Dysphagia, unspecified: Secondary | ICD-10-CM | POA: Diagnosis not present

## 2021-01-13 DIAGNOSIS — E1122 Type 2 diabetes mellitus with diabetic chronic kidney disease: Secondary | ICD-10-CM | POA: Diagnosis not present

## 2021-01-13 DIAGNOSIS — Z89512 Acquired absence of left leg below knee: Secondary | ICD-10-CM | POA: Diagnosis not present

## 2021-01-13 DIAGNOSIS — K219 Gastro-esophageal reflux disease without esophagitis: Secondary | ICD-10-CM | POA: Diagnosis not present

## 2021-01-13 DIAGNOSIS — F4323 Adjustment disorder with mixed anxiety and depressed mood: Secondary | ICD-10-CM | POA: Diagnosis not present

## 2021-01-13 DIAGNOSIS — Z89511 Acquired absence of right leg below knee: Secondary | ICD-10-CM | POA: Diagnosis not present

## 2021-01-13 DIAGNOSIS — Z951 Presence of aortocoronary bypass graft: Secondary | ICD-10-CM | POA: Diagnosis not present

## 2021-01-13 DIAGNOSIS — R011 Cardiac murmur, unspecified: Secondary | ICD-10-CM | POA: Diagnosis not present

## 2021-01-14 DIAGNOSIS — N1831 Chronic kidney disease, stage 3a: Secondary | ICD-10-CM | POA: Diagnosis not present

## 2021-01-14 DIAGNOSIS — F4323 Adjustment disorder with mixed anxiety and depressed mood: Secondary | ICD-10-CM | POA: Diagnosis not present

## 2021-01-14 DIAGNOSIS — K219 Gastro-esophageal reflux disease without esophagitis: Secondary | ICD-10-CM | POA: Diagnosis not present

## 2021-01-14 DIAGNOSIS — I130 Hypertensive heart and chronic kidney disease with heart failure and stage 1 through stage 4 chronic kidney disease, or unspecified chronic kidney disease: Secondary | ICD-10-CM | POA: Diagnosis not present

## 2021-01-14 DIAGNOSIS — I2510 Atherosclerotic heart disease of native coronary artery without angina pectoris: Secondary | ICD-10-CM | POA: Diagnosis not present

## 2021-01-14 DIAGNOSIS — Z89512 Acquired absence of left leg below knee: Secondary | ICD-10-CM | POA: Diagnosis not present

## 2021-01-14 DIAGNOSIS — I5032 Chronic diastolic (congestive) heart failure: Secondary | ICD-10-CM | POA: Diagnosis not present

## 2021-01-14 DIAGNOSIS — E1122 Type 2 diabetes mellitus with diabetic chronic kidney disease: Secondary | ICD-10-CM | POA: Diagnosis not present

## 2021-01-14 DIAGNOSIS — Z89511 Acquired absence of right leg below knee: Secondary | ICD-10-CM | POA: Diagnosis not present

## 2021-01-14 DIAGNOSIS — Z951 Presence of aortocoronary bypass graft: Secondary | ICD-10-CM | POA: Diagnosis not present

## 2021-01-14 DIAGNOSIS — R011 Cardiac murmur, unspecified: Secondary | ICD-10-CM | POA: Diagnosis not present

## 2021-01-14 DIAGNOSIS — I6330 Cerebral infarction due to thrombosis of unspecified cerebral artery: Secondary | ICD-10-CM | POA: Diagnosis not present

## 2021-01-14 DIAGNOSIS — R1310 Dysphagia, unspecified: Secondary | ICD-10-CM | POA: Diagnosis not present

## 2021-01-15 DIAGNOSIS — N1831 Chronic kidney disease, stage 3a: Secondary | ICD-10-CM | POA: Diagnosis not present

## 2021-01-15 DIAGNOSIS — R1310 Dysphagia, unspecified: Secondary | ICD-10-CM | POA: Diagnosis not present

## 2021-01-15 DIAGNOSIS — Z89511 Acquired absence of right leg below knee: Secondary | ICD-10-CM | POA: Diagnosis not present

## 2021-01-15 DIAGNOSIS — Z89512 Acquired absence of left leg below knee: Secondary | ICD-10-CM | POA: Diagnosis not present

## 2021-01-15 DIAGNOSIS — I6330 Cerebral infarction due to thrombosis of unspecified cerebral artery: Secondary | ICD-10-CM | POA: Diagnosis not present

## 2021-01-15 DIAGNOSIS — E1122 Type 2 diabetes mellitus with diabetic chronic kidney disease: Secondary | ICD-10-CM | POA: Diagnosis not present

## 2021-01-15 DIAGNOSIS — I130 Hypertensive heart and chronic kidney disease with heart failure and stage 1 through stage 4 chronic kidney disease, or unspecified chronic kidney disease: Secondary | ICD-10-CM | POA: Diagnosis not present

## 2021-01-15 DIAGNOSIS — R011 Cardiac murmur, unspecified: Secondary | ICD-10-CM | POA: Diagnosis not present

## 2021-01-15 DIAGNOSIS — Z951 Presence of aortocoronary bypass graft: Secondary | ICD-10-CM | POA: Diagnosis not present

## 2021-01-15 DIAGNOSIS — I2510 Atherosclerotic heart disease of native coronary artery without angina pectoris: Secondary | ICD-10-CM | POA: Diagnosis not present

## 2021-01-15 DIAGNOSIS — K219 Gastro-esophageal reflux disease without esophagitis: Secondary | ICD-10-CM | POA: Diagnosis not present

## 2021-01-15 DIAGNOSIS — F4323 Adjustment disorder with mixed anxiety and depressed mood: Secondary | ICD-10-CM | POA: Diagnosis not present

## 2021-01-15 DIAGNOSIS — I5032 Chronic diastolic (congestive) heart failure: Secondary | ICD-10-CM | POA: Diagnosis not present

## 2021-01-16 DIAGNOSIS — N1831 Chronic kidney disease, stage 3a: Secondary | ICD-10-CM | POA: Diagnosis not present

## 2021-01-16 DIAGNOSIS — R011 Cardiac murmur, unspecified: Secondary | ICD-10-CM | POA: Diagnosis not present

## 2021-01-16 DIAGNOSIS — Z951 Presence of aortocoronary bypass graft: Secondary | ICD-10-CM | POA: Diagnosis not present

## 2021-01-16 DIAGNOSIS — I6330 Cerebral infarction due to thrombosis of unspecified cerebral artery: Secondary | ICD-10-CM | POA: Diagnosis not present

## 2021-01-16 DIAGNOSIS — I5032 Chronic diastolic (congestive) heart failure: Secondary | ICD-10-CM | POA: Diagnosis not present

## 2021-01-16 DIAGNOSIS — E1122 Type 2 diabetes mellitus with diabetic chronic kidney disease: Secondary | ICD-10-CM | POA: Diagnosis not present

## 2021-01-16 DIAGNOSIS — I2510 Atherosclerotic heart disease of native coronary artery without angina pectoris: Secondary | ICD-10-CM | POA: Diagnosis not present

## 2021-01-16 DIAGNOSIS — I130 Hypertensive heart and chronic kidney disease with heart failure and stage 1 through stage 4 chronic kidney disease, or unspecified chronic kidney disease: Secondary | ICD-10-CM | POA: Diagnosis not present

## 2021-01-16 DIAGNOSIS — Z89512 Acquired absence of left leg below knee: Secondary | ICD-10-CM | POA: Diagnosis not present

## 2021-01-16 DIAGNOSIS — K219 Gastro-esophageal reflux disease without esophagitis: Secondary | ICD-10-CM | POA: Diagnosis not present

## 2021-01-16 DIAGNOSIS — F4323 Adjustment disorder with mixed anxiety and depressed mood: Secondary | ICD-10-CM | POA: Diagnosis not present

## 2021-01-16 DIAGNOSIS — Z89511 Acquired absence of right leg below knee: Secondary | ICD-10-CM | POA: Diagnosis not present

## 2021-01-16 DIAGNOSIS — R1310 Dysphagia, unspecified: Secondary | ICD-10-CM | POA: Diagnosis not present

## 2021-01-17 DIAGNOSIS — I5032 Chronic diastolic (congestive) heart failure: Secondary | ICD-10-CM | POA: Diagnosis not present

## 2021-01-17 DIAGNOSIS — I130 Hypertensive heart and chronic kidney disease with heart failure and stage 1 through stage 4 chronic kidney disease, or unspecified chronic kidney disease: Secondary | ICD-10-CM | POA: Diagnosis not present

## 2021-01-17 DIAGNOSIS — N1831 Chronic kidney disease, stage 3a: Secondary | ICD-10-CM | POA: Diagnosis not present

## 2021-01-17 DIAGNOSIS — E1122 Type 2 diabetes mellitus with diabetic chronic kidney disease: Secondary | ICD-10-CM | POA: Diagnosis not present

## 2021-01-17 DIAGNOSIS — I6330 Cerebral infarction due to thrombosis of unspecified cerebral artery: Secondary | ICD-10-CM | POA: Diagnosis not present

## 2021-01-17 DIAGNOSIS — Z89512 Acquired absence of left leg below knee: Secondary | ICD-10-CM | POA: Diagnosis not present

## 2021-01-17 DIAGNOSIS — Z951 Presence of aortocoronary bypass graft: Secondary | ICD-10-CM | POA: Diagnosis not present

## 2021-01-17 DIAGNOSIS — R011 Cardiac murmur, unspecified: Secondary | ICD-10-CM | POA: Diagnosis not present

## 2021-01-17 DIAGNOSIS — R1310 Dysphagia, unspecified: Secondary | ICD-10-CM | POA: Diagnosis not present

## 2021-01-17 DIAGNOSIS — Z89511 Acquired absence of right leg below knee: Secondary | ICD-10-CM | POA: Diagnosis not present

## 2021-01-17 DIAGNOSIS — F4323 Adjustment disorder with mixed anxiety and depressed mood: Secondary | ICD-10-CM | POA: Diagnosis not present

## 2021-01-17 DIAGNOSIS — K219 Gastro-esophageal reflux disease without esophagitis: Secondary | ICD-10-CM | POA: Diagnosis not present

## 2021-01-17 DIAGNOSIS — I2510 Atherosclerotic heart disease of native coronary artery without angina pectoris: Secondary | ICD-10-CM | POA: Diagnosis not present

## 2021-01-18 DIAGNOSIS — Z951 Presence of aortocoronary bypass graft: Secondary | ICD-10-CM | POA: Diagnosis not present

## 2021-01-18 DIAGNOSIS — R011 Cardiac murmur, unspecified: Secondary | ICD-10-CM | POA: Diagnosis not present

## 2021-01-18 DIAGNOSIS — F4323 Adjustment disorder with mixed anxiety and depressed mood: Secondary | ICD-10-CM | POA: Diagnosis not present

## 2021-01-18 DIAGNOSIS — I5032 Chronic diastolic (congestive) heart failure: Secondary | ICD-10-CM | POA: Diagnosis not present

## 2021-01-18 DIAGNOSIS — I130 Hypertensive heart and chronic kidney disease with heart failure and stage 1 through stage 4 chronic kidney disease, or unspecified chronic kidney disease: Secondary | ICD-10-CM | POA: Diagnosis not present

## 2021-01-18 DIAGNOSIS — R1310 Dysphagia, unspecified: Secondary | ICD-10-CM | POA: Diagnosis not present

## 2021-01-18 DIAGNOSIS — I6330 Cerebral infarction due to thrombosis of unspecified cerebral artery: Secondary | ICD-10-CM | POA: Diagnosis not present

## 2021-01-18 DIAGNOSIS — N1831 Chronic kidney disease, stage 3a: Secondary | ICD-10-CM | POA: Diagnosis not present

## 2021-01-18 DIAGNOSIS — Z89512 Acquired absence of left leg below knee: Secondary | ICD-10-CM | POA: Diagnosis not present

## 2021-01-18 DIAGNOSIS — Z89511 Acquired absence of right leg below knee: Secondary | ICD-10-CM | POA: Diagnosis not present

## 2021-01-18 DIAGNOSIS — E1122 Type 2 diabetes mellitus with diabetic chronic kidney disease: Secondary | ICD-10-CM | POA: Diagnosis not present

## 2021-01-18 DIAGNOSIS — K219 Gastro-esophageal reflux disease without esophagitis: Secondary | ICD-10-CM | POA: Diagnosis not present

## 2021-01-18 DIAGNOSIS — I2510 Atherosclerotic heart disease of native coronary artery without angina pectoris: Secondary | ICD-10-CM | POA: Diagnosis not present

## 2021-01-19 DIAGNOSIS — Z89512 Acquired absence of left leg below knee: Secondary | ICD-10-CM | POA: Diagnosis not present

## 2021-01-19 DIAGNOSIS — N1831 Chronic kidney disease, stage 3a: Secondary | ICD-10-CM | POA: Diagnosis not present

## 2021-01-19 DIAGNOSIS — Z89511 Acquired absence of right leg below knee: Secondary | ICD-10-CM | POA: Diagnosis not present

## 2021-01-19 DIAGNOSIS — I5032 Chronic diastolic (congestive) heart failure: Secondary | ICD-10-CM | POA: Diagnosis not present

## 2021-01-19 DIAGNOSIS — K219 Gastro-esophageal reflux disease without esophagitis: Secondary | ICD-10-CM | POA: Diagnosis not present

## 2021-01-19 DIAGNOSIS — I130 Hypertensive heart and chronic kidney disease with heart failure and stage 1 through stage 4 chronic kidney disease, or unspecified chronic kidney disease: Secondary | ICD-10-CM | POA: Diagnosis not present

## 2021-01-19 DIAGNOSIS — E1122 Type 2 diabetes mellitus with diabetic chronic kidney disease: Secondary | ICD-10-CM | POA: Diagnosis not present

## 2021-01-19 DIAGNOSIS — I6330 Cerebral infarction due to thrombosis of unspecified cerebral artery: Secondary | ICD-10-CM | POA: Diagnosis not present

## 2021-01-19 DIAGNOSIS — F4323 Adjustment disorder with mixed anxiety and depressed mood: Secondary | ICD-10-CM | POA: Diagnosis not present

## 2021-01-19 DIAGNOSIS — I2510 Atherosclerotic heart disease of native coronary artery without angina pectoris: Secondary | ICD-10-CM | POA: Diagnosis not present

## 2021-01-19 DIAGNOSIS — R011 Cardiac murmur, unspecified: Secondary | ICD-10-CM | POA: Diagnosis not present

## 2021-01-19 DIAGNOSIS — R1310 Dysphagia, unspecified: Secondary | ICD-10-CM | POA: Diagnosis not present

## 2021-01-19 DIAGNOSIS — Z951 Presence of aortocoronary bypass graft: Secondary | ICD-10-CM | POA: Diagnosis not present

## 2021-01-20 DIAGNOSIS — I130 Hypertensive heart and chronic kidney disease with heart failure and stage 1 through stage 4 chronic kidney disease, or unspecified chronic kidney disease: Secondary | ICD-10-CM | POA: Diagnosis not present

## 2021-01-20 DIAGNOSIS — Z89511 Acquired absence of right leg below knee: Secondary | ICD-10-CM | POA: Diagnosis not present

## 2021-01-20 DIAGNOSIS — F4323 Adjustment disorder with mixed anxiety and depressed mood: Secondary | ICD-10-CM | POA: Diagnosis not present

## 2021-01-20 DIAGNOSIS — E1122 Type 2 diabetes mellitus with diabetic chronic kidney disease: Secondary | ICD-10-CM | POA: Diagnosis not present

## 2021-01-20 DIAGNOSIS — K219 Gastro-esophageal reflux disease without esophagitis: Secondary | ICD-10-CM | POA: Diagnosis not present

## 2021-01-20 DIAGNOSIS — R011 Cardiac murmur, unspecified: Secondary | ICD-10-CM | POA: Diagnosis not present

## 2021-01-20 DIAGNOSIS — R1310 Dysphagia, unspecified: Secondary | ICD-10-CM | POA: Diagnosis not present

## 2021-01-20 DIAGNOSIS — Z951 Presence of aortocoronary bypass graft: Secondary | ICD-10-CM | POA: Diagnosis not present

## 2021-01-20 DIAGNOSIS — I2510 Atherosclerotic heart disease of native coronary artery without angina pectoris: Secondary | ICD-10-CM | POA: Diagnosis not present

## 2021-01-20 DIAGNOSIS — Z89512 Acquired absence of left leg below knee: Secondary | ICD-10-CM | POA: Diagnosis not present

## 2021-01-20 DIAGNOSIS — N1831 Chronic kidney disease, stage 3a: Secondary | ICD-10-CM | POA: Diagnosis not present

## 2021-01-20 DIAGNOSIS — I6330 Cerebral infarction due to thrombosis of unspecified cerebral artery: Secondary | ICD-10-CM | POA: Diagnosis not present

## 2021-01-20 DIAGNOSIS — I5032 Chronic diastolic (congestive) heart failure: Secondary | ICD-10-CM | POA: Diagnosis not present

## 2021-01-21 DIAGNOSIS — K219 Gastro-esophageal reflux disease without esophagitis: Secondary | ICD-10-CM | POA: Diagnosis not present

## 2021-01-21 DIAGNOSIS — I5032 Chronic diastolic (congestive) heart failure: Secondary | ICD-10-CM | POA: Diagnosis not present

## 2021-01-21 DIAGNOSIS — F4323 Adjustment disorder with mixed anxiety and depressed mood: Secondary | ICD-10-CM | POA: Diagnosis not present

## 2021-01-21 DIAGNOSIS — Z89512 Acquired absence of left leg below knee: Secondary | ICD-10-CM | POA: Diagnosis not present

## 2021-01-21 DIAGNOSIS — Z89511 Acquired absence of right leg below knee: Secondary | ICD-10-CM | POA: Diagnosis not present

## 2021-01-21 DIAGNOSIS — Z951 Presence of aortocoronary bypass graft: Secondary | ICD-10-CM | POA: Diagnosis not present

## 2021-01-21 DIAGNOSIS — R1310 Dysphagia, unspecified: Secondary | ICD-10-CM | POA: Diagnosis not present

## 2021-01-21 DIAGNOSIS — I2510 Atherosclerotic heart disease of native coronary artery without angina pectoris: Secondary | ICD-10-CM | POA: Diagnosis not present

## 2021-01-21 DIAGNOSIS — R011 Cardiac murmur, unspecified: Secondary | ICD-10-CM | POA: Diagnosis not present

## 2021-01-21 DIAGNOSIS — E1122 Type 2 diabetes mellitus with diabetic chronic kidney disease: Secondary | ICD-10-CM | POA: Diagnosis not present

## 2021-01-21 DIAGNOSIS — I6330 Cerebral infarction due to thrombosis of unspecified cerebral artery: Secondary | ICD-10-CM | POA: Diagnosis not present

## 2021-01-21 DIAGNOSIS — I130 Hypertensive heart and chronic kidney disease with heart failure and stage 1 through stage 4 chronic kidney disease, or unspecified chronic kidney disease: Secondary | ICD-10-CM | POA: Diagnosis not present

## 2021-01-21 DIAGNOSIS — N1831 Chronic kidney disease, stage 3a: Secondary | ICD-10-CM | POA: Diagnosis not present

## 2021-01-22 DIAGNOSIS — R011 Cardiac murmur, unspecified: Secondary | ICD-10-CM | POA: Diagnosis not present

## 2021-01-22 DIAGNOSIS — I5032 Chronic diastolic (congestive) heart failure: Secondary | ICD-10-CM | POA: Diagnosis not present

## 2021-01-22 DIAGNOSIS — Z951 Presence of aortocoronary bypass graft: Secondary | ICD-10-CM | POA: Diagnosis not present

## 2021-01-22 DIAGNOSIS — N1831 Chronic kidney disease, stage 3a: Secondary | ICD-10-CM | POA: Diagnosis not present

## 2021-01-22 DIAGNOSIS — E1122 Type 2 diabetes mellitus with diabetic chronic kidney disease: Secondary | ICD-10-CM | POA: Diagnosis not present

## 2021-01-22 DIAGNOSIS — F4323 Adjustment disorder with mixed anxiety and depressed mood: Secondary | ICD-10-CM | POA: Diagnosis not present

## 2021-01-22 DIAGNOSIS — I130 Hypertensive heart and chronic kidney disease with heart failure and stage 1 through stage 4 chronic kidney disease, or unspecified chronic kidney disease: Secondary | ICD-10-CM | POA: Diagnosis not present

## 2021-01-22 DIAGNOSIS — R1310 Dysphagia, unspecified: Secondary | ICD-10-CM | POA: Diagnosis not present

## 2021-01-22 DIAGNOSIS — I6330 Cerebral infarction due to thrombosis of unspecified cerebral artery: Secondary | ICD-10-CM | POA: Diagnosis not present

## 2021-01-22 DIAGNOSIS — K219 Gastro-esophageal reflux disease without esophagitis: Secondary | ICD-10-CM | POA: Diagnosis not present

## 2021-01-22 DIAGNOSIS — I2510 Atherosclerotic heart disease of native coronary artery without angina pectoris: Secondary | ICD-10-CM | POA: Diagnosis not present

## 2021-01-22 DIAGNOSIS — Z89512 Acquired absence of left leg below knee: Secondary | ICD-10-CM | POA: Diagnosis not present

## 2021-01-22 DIAGNOSIS — Z89511 Acquired absence of right leg below knee: Secondary | ICD-10-CM | POA: Diagnosis not present

## 2021-01-23 DIAGNOSIS — I130 Hypertensive heart and chronic kidney disease with heart failure and stage 1 through stage 4 chronic kidney disease, or unspecified chronic kidney disease: Secondary | ICD-10-CM | POA: Diagnosis not present

## 2021-01-23 DIAGNOSIS — R1310 Dysphagia, unspecified: Secondary | ICD-10-CM | POA: Diagnosis not present

## 2021-01-23 DIAGNOSIS — K219 Gastro-esophageal reflux disease without esophagitis: Secondary | ICD-10-CM | POA: Diagnosis not present

## 2021-01-23 DIAGNOSIS — Z89511 Acquired absence of right leg below knee: Secondary | ICD-10-CM | POA: Diagnosis not present

## 2021-01-23 DIAGNOSIS — I5032 Chronic diastolic (congestive) heart failure: Secondary | ICD-10-CM | POA: Diagnosis not present

## 2021-01-23 DIAGNOSIS — E1122 Type 2 diabetes mellitus with diabetic chronic kidney disease: Secondary | ICD-10-CM | POA: Diagnosis not present

## 2021-01-23 DIAGNOSIS — Z89512 Acquired absence of left leg below knee: Secondary | ICD-10-CM | POA: Diagnosis not present

## 2021-01-23 DIAGNOSIS — F4323 Adjustment disorder with mixed anxiety and depressed mood: Secondary | ICD-10-CM | POA: Diagnosis not present

## 2021-01-23 DIAGNOSIS — N1831 Chronic kidney disease, stage 3a: Secondary | ICD-10-CM | POA: Diagnosis not present

## 2021-01-23 DIAGNOSIS — I6330 Cerebral infarction due to thrombosis of unspecified cerebral artery: Secondary | ICD-10-CM | POA: Diagnosis not present

## 2021-01-23 DIAGNOSIS — R011 Cardiac murmur, unspecified: Secondary | ICD-10-CM | POA: Diagnosis not present

## 2021-01-23 DIAGNOSIS — I2510 Atherosclerotic heart disease of native coronary artery without angina pectoris: Secondary | ICD-10-CM | POA: Diagnosis not present

## 2021-01-23 DIAGNOSIS — Z951 Presence of aortocoronary bypass graft: Secondary | ICD-10-CM | POA: Diagnosis not present

## 2021-01-24 DIAGNOSIS — I6330 Cerebral infarction due to thrombosis of unspecified cerebral artery: Secondary | ICD-10-CM | POA: Diagnosis not present

## 2021-01-24 DIAGNOSIS — I130 Hypertensive heart and chronic kidney disease with heart failure and stage 1 through stage 4 chronic kidney disease, or unspecified chronic kidney disease: Secondary | ICD-10-CM | POA: Diagnosis not present

## 2021-01-24 DIAGNOSIS — I2510 Atherosclerotic heart disease of native coronary artery without angina pectoris: Secondary | ICD-10-CM | POA: Diagnosis not present

## 2021-01-24 DIAGNOSIS — Z89512 Acquired absence of left leg below knee: Secondary | ICD-10-CM | POA: Diagnosis not present

## 2021-01-24 DIAGNOSIS — Z89511 Acquired absence of right leg below knee: Secondary | ICD-10-CM | POA: Diagnosis not present

## 2021-01-24 DIAGNOSIS — E1122 Type 2 diabetes mellitus with diabetic chronic kidney disease: Secondary | ICD-10-CM | POA: Diagnosis not present

## 2021-01-24 DIAGNOSIS — I5032 Chronic diastolic (congestive) heart failure: Secondary | ICD-10-CM | POA: Diagnosis not present

## 2021-01-24 DIAGNOSIS — Z951 Presence of aortocoronary bypass graft: Secondary | ICD-10-CM | POA: Diagnosis not present

## 2021-01-24 DIAGNOSIS — F4323 Adjustment disorder with mixed anxiety and depressed mood: Secondary | ICD-10-CM | POA: Diagnosis not present

## 2021-01-24 DIAGNOSIS — R011 Cardiac murmur, unspecified: Secondary | ICD-10-CM | POA: Diagnosis not present

## 2021-01-24 DIAGNOSIS — N1831 Chronic kidney disease, stage 3a: Secondary | ICD-10-CM | POA: Diagnosis not present

## 2021-01-24 DIAGNOSIS — K219 Gastro-esophageal reflux disease without esophagitis: Secondary | ICD-10-CM | POA: Diagnosis not present

## 2021-01-24 DIAGNOSIS — R1310 Dysphagia, unspecified: Secondary | ICD-10-CM | POA: Diagnosis not present

## 2021-01-25 DIAGNOSIS — I6330 Cerebral infarction due to thrombosis of unspecified cerebral artery: Secondary | ICD-10-CM | POA: Diagnosis not present

## 2021-01-25 DIAGNOSIS — I130 Hypertensive heart and chronic kidney disease with heart failure and stage 1 through stage 4 chronic kidney disease, or unspecified chronic kidney disease: Secondary | ICD-10-CM | POA: Diagnosis not present

## 2021-01-25 DIAGNOSIS — R011 Cardiac murmur, unspecified: Secondary | ICD-10-CM | POA: Diagnosis not present

## 2021-01-25 DIAGNOSIS — Z89511 Acquired absence of right leg below knee: Secondary | ICD-10-CM | POA: Diagnosis not present

## 2021-01-25 DIAGNOSIS — Z89512 Acquired absence of left leg below knee: Secondary | ICD-10-CM | POA: Diagnosis not present

## 2021-01-25 DIAGNOSIS — N1831 Chronic kidney disease, stage 3a: Secondary | ICD-10-CM | POA: Diagnosis not present

## 2021-01-25 DIAGNOSIS — I5032 Chronic diastolic (congestive) heart failure: Secondary | ICD-10-CM | POA: Diagnosis not present

## 2021-01-25 DIAGNOSIS — E1122 Type 2 diabetes mellitus with diabetic chronic kidney disease: Secondary | ICD-10-CM | POA: Diagnosis not present

## 2021-01-25 DIAGNOSIS — K219 Gastro-esophageal reflux disease without esophagitis: Secondary | ICD-10-CM | POA: Diagnosis not present

## 2021-01-25 DIAGNOSIS — F4323 Adjustment disorder with mixed anxiety and depressed mood: Secondary | ICD-10-CM | POA: Diagnosis not present

## 2021-01-25 DIAGNOSIS — Z951 Presence of aortocoronary bypass graft: Secondary | ICD-10-CM | POA: Diagnosis not present

## 2021-01-25 DIAGNOSIS — I2510 Atherosclerotic heart disease of native coronary artery without angina pectoris: Secondary | ICD-10-CM | POA: Diagnosis not present

## 2021-01-25 DIAGNOSIS — R1310 Dysphagia, unspecified: Secondary | ICD-10-CM | POA: Diagnosis not present

## 2021-01-26 DIAGNOSIS — K219 Gastro-esophageal reflux disease without esophagitis: Secondary | ICD-10-CM | POA: Diagnosis not present

## 2021-01-26 DIAGNOSIS — I5032 Chronic diastolic (congestive) heart failure: Secondary | ICD-10-CM | POA: Diagnosis not present

## 2021-01-26 DIAGNOSIS — Z89511 Acquired absence of right leg below knee: Secondary | ICD-10-CM | POA: Diagnosis not present

## 2021-01-26 DIAGNOSIS — I6330 Cerebral infarction due to thrombosis of unspecified cerebral artery: Secondary | ICD-10-CM | POA: Diagnosis not present

## 2021-01-26 DIAGNOSIS — R011 Cardiac murmur, unspecified: Secondary | ICD-10-CM | POA: Diagnosis not present

## 2021-01-26 DIAGNOSIS — Z89512 Acquired absence of left leg below knee: Secondary | ICD-10-CM | POA: Diagnosis not present

## 2021-01-26 DIAGNOSIS — I130 Hypertensive heart and chronic kidney disease with heart failure and stage 1 through stage 4 chronic kidney disease, or unspecified chronic kidney disease: Secondary | ICD-10-CM | POA: Diagnosis not present

## 2021-01-26 DIAGNOSIS — E1122 Type 2 diabetes mellitus with diabetic chronic kidney disease: Secondary | ICD-10-CM | POA: Diagnosis not present

## 2021-01-26 DIAGNOSIS — I2510 Atherosclerotic heart disease of native coronary artery without angina pectoris: Secondary | ICD-10-CM | POA: Diagnosis not present

## 2021-01-26 DIAGNOSIS — F4323 Adjustment disorder with mixed anxiety and depressed mood: Secondary | ICD-10-CM | POA: Diagnosis not present

## 2021-01-26 DIAGNOSIS — Z951 Presence of aortocoronary bypass graft: Secondary | ICD-10-CM | POA: Diagnosis not present

## 2021-01-26 DIAGNOSIS — N1831 Chronic kidney disease, stage 3a: Secondary | ICD-10-CM | POA: Diagnosis not present

## 2021-01-26 DIAGNOSIS — R1310 Dysphagia, unspecified: Secondary | ICD-10-CM | POA: Diagnosis not present

## 2021-01-27 DIAGNOSIS — I2510 Atherosclerotic heart disease of native coronary artery without angina pectoris: Secondary | ICD-10-CM | POA: Diagnosis not present

## 2021-01-27 DIAGNOSIS — Z951 Presence of aortocoronary bypass graft: Secondary | ICD-10-CM | POA: Diagnosis not present

## 2021-01-27 DIAGNOSIS — I6330 Cerebral infarction due to thrombosis of unspecified cerebral artery: Secondary | ICD-10-CM | POA: Diagnosis not present

## 2021-01-27 DIAGNOSIS — N1831 Chronic kidney disease, stage 3a: Secondary | ICD-10-CM | POA: Diagnosis not present

## 2021-01-27 DIAGNOSIS — R011 Cardiac murmur, unspecified: Secondary | ICD-10-CM | POA: Diagnosis not present

## 2021-01-27 DIAGNOSIS — Z89512 Acquired absence of left leg below knee: Secondary | ICD-10-CM | POA: Diagnosis not present

## 2021-01-27 DIAGNOSIS — K219 Gastro-esophageal reflux disease without esophagitis: Secondary | ICD-10-CM | POA: Diagnosis not present

## 2021-01-27 DIAGNOSIS — F4323 Adjustment disorder with mixed anxiety and depressed mood: Secondary | ICD-10-CM | POA: Diagnosis not present

## 2021-01-27 DIAGNOSIS — R1310 Dysphagia, unspecified: Secondary | ICD-10-CM | POA: Diagnosis not present

## 2021-01-27 DIAGNOSIS — E1122 Type 2 diabetes mellitus with diabetic chronic kidney disease: Secondary | ICD-10-CM | POA: Diagnosis not present

## 2021-01-27 DIAGNOSIS — I5032 Chronic diastolic (congestive) heart failure: Secondary | ICD-10-CM | POA: Diagnosis not present

## 2021-01-27 DIAGNOSIS — I130 Hypertensive heart and chronic kidney disease with heart failure and stage 1 through stage 4 chronic kidney disease, or unspecified chronic kidney disease: Secondary | ICD-10-CM | POA: Diagnosis not present

## 2021-01-27 DIAGNOSIS — Z89511 Acquired absence of right leg below knee: Secondary | ICD-10-CM | POA: Diagnosis not present

## 2021-02-06 DEATH — deceased

## 2021-04-02 ENCOUNTER — Ambulatory Visit: Payer: BC Managed Care – PPO | Admitting: Family Medicine
